# Patient Record
Sex: Female | Born: 1945
Health system: Southern US, Community
[De-identification: ages and names within clinical notes are randomized; demographics above are authoritative.]

## PROBLEM LIST (undated history)

## (undated) DIAGNOSIS — Z86010 Personal history of colonic polyps: Secondary | ICD-10-CM

## (undated) DIAGNOSIS — E785 Hyperlipidemia, unspecified: Secondary | ICD-10-CM

## (undated) DIAGNOSIS — Z860101 Personal history of adenomatous and serrated colon polyps: Secondary | ICD-10-CM

## (undated) DIAGNOSIS — E1165 Type 2 diabetes mellitus with hyperglycemia: Secondary | ICD-10-CM

## (undated) DIAGNOSIS — I10 Essential (primary) hypertension: Secondary | ICD-10-CM

## (undated) DIAGNOSIS — I639 Cerebral infarction, unspecified: Secondary | ICD-10-CM

## (undated) DIAGNOSIS — L0292 Furuncle, unspecified: Secondary | ICD-10-CM

## (undated) DIAGNOSIS — J189 Pneumonia, unspecified organism: Secondary | ICD-10-CM

## (undated) DIAGNOSIS — R011 Cardiac murmur, unspecified: Secondary | ICD-10-CM

## (undated) DIAGNOSIS — IMO0001 Reserved for inherently not codable concepts without codable children: Secondary | ICD-10-CM

## (undated) HISTORY — PX: ABDOMINAL HYSTERECTOMY: SHX81

## (undated) HISTORY — DX: Furuncle, unspecified: L02.92

## (undated) HISTORY — DX: Personal history of adenomatous and serrated colon polyps: Z86.0101

## (undated) HISTORY — DX: Reserved for inherently not codable concepts without codable children: IMO0001

## (undated) HISTORY — DX: Cerebral infarction, unspecified: I63.9

## (undated) HISTORY — DX: Cardiac murmur, unspecified: R01.1

## (undated) HISTORY — DX: Hyperlipidemia, unspecified: E78.5

## (undated) HISTORY — DX: Personal history of colonic polyps: Z86.010

## (undated) HISTORY — DX: Essential (primary) hypertension: I10

## (undated) HISTORY — PX: CARPAL TUNNEL RELEASE: SHX101

## (undated) HISTORY — PX: POLYPECTOMY: SHX149

## (undated) HISTORY — PX: KNEE ARTHROSCOPY: SUR90

## (undated) HISTORY — PX: TONSILLECTOMY: SUR1361

## (undated) HISTORY — DX: Type 2 diabetes mellitus with hyperglycemia: E11.65

---

## 2005-09-29 ENCOUNTER — Ambulatory Visit: Payer: Self-pay | Admitting: Gastroenterology

## 2005-10-21 ENCOUNTER — Other Ambulatory Visit: Admission: RE | Admit: 2005-10-21 | Discharge: 2005-10-21 | Payer: Self-pay | Admitting: Obstetrics and Gynecology

## 2005-11-19 ENCOUNTER — Ambulatory Visit: Payer: Self-pay | Admitting: Gastroenterology

## 2005-11-19 ENCOUNTER — Encounter (INDEPENDENT_AMBULATORY_CARE_PROVIDER_SITE_OTHER): Payer: Self-pay | Admitting: *Deleted

## 2007-03-04 ENCOUNTER — Encounter: Admission: RE | Admit: 2007-03-04 | Discharge: 2007-03-04 | Payer: Self-pay | Admitting: Internal Medicine

## 2010-05-29 NOTE — Assessment & Plan Note (Signed)
[facility]                           GASTROENTEROLOGY OFFICE NOTE   IVELIS, NORGARD                   MRN:          [id number]  DATE:09/29/2005                            DOB:          04-24-1945    CONSULTING PHYSICIAN:  Barbette Hair. Arlyce Dice, M.D., [facility]   REASON FOR CONSULTATION:  For colonoscopy screening and history of polyps.   HISTORY:  Sarah [name] is a pleasant 65 year old African-American female.  She  is an insulin-dependent diabetic times 15 years.  She also has a history of  hypertension.  She relates history of a sigmoidoscopy done approximately 10  years ago and then a colonoscopy done approximately seven years ago while  she was living in [place].  She states she did have polyps removed at  that time and has not had follow up since.  She had a recent physical with  Dr. Clelia Croft.  Her labs showed a hemoglobin of 11.5, hematocrit of 33.6 and WBC  of 6.2.  She completed stool cards, but we do not have the results of those.   The patient currently has no GI complaints.  She states that she has not  been having any difficulty with abdominal pain, no heartburn, indigestion,  etc.  Her bowel habits have been very regular with no constipation or  diarrhea; and, she has not noted any melena or hematochezia.   CURRENT MEDICATIONS:  1. Lescol XL 80 mg daily.  2. Glimepiride 4 mg daily.  3. Aspirin 81 mg daily.  4. Avandamet 4/1,000 twice a day.  5. Lantus 16 units at bedtime.  6. Xalatan eye drops.  7. Multivitamin daily.  8. Calcium daily.   ALLERGIES:  No known drug allergies.   PAST MEDICAL HISTORY:  The past medical history is pertinent for:  1. Hypertension.  2. Insulin-dependent diabetes mellitus.  3. The patient is status post hysterectomy.   FAMILY HISTORY:  The family history is negative for colon cancer or polyps.  Positive for prostatic cancer in her father and diabetes in her mother,  father and brother.   SOCIAL  HISTORY:  The patient is single.  She lives with her mother.  She is  retired.  She is a nonsmoker.  Drinks minimal alcohol socially.   REVIEW OF SYSTEMS:  The review of systems is completely negative including  cardiovascular, pulmonary, genitourinary and musculoskeletal.  Gastrointestinal is as above.   PHYSICAL EXAMINATION:  GENERAL APPEARANCE:  This is a well-developed white  female in no acute distress.  VITAL SIGNS:  Height is 5 feet.  Weight is 183.6.  Blood pressure 122/70 and  pulse is 80.  HEENT:  Atraumatic and normocephalic.  Extraocular muscles are intact.  Pupils equal and react to light and accommodation.  Sclerae anicteric.  HEART:  Cardiovascular exam shows regular rate and rhythm with S1 and S2.  CHEST AND LUNGS:  Pulmonary is clear to auscultation and percussion.  ABDOMEN:  The abdomen is soft and nontender.  There is no palpable mass or  hepatosplenomegaly.  Bowel sounds are active.  RECTAL:  The rectal exam is not done at this time.  IMPRESSION:  1. 70 African-American female with a personal history of colon      polyps who is referred for follow up colonoscopy; currently      asymptomatic.  2. Insulin-dependent diabetes mellitus.  3. Hypertension.  4. Status post hysterectomy.   PLAN:  We will schedule a colonoscopy at her convenience.  We will plan to  hold her aspirin preprocedure and hold her Lantus the evening before.                                   Amy Esterwood, PA-C                                Robert D. Arlyce Dice, MD,FACG   AE/MedQ  DD:  09/29/2005  DT:  10/01/2005  Job #:  [id number]

## 2010-12-02 ENCOUNTER — Ambulatory Visit (AMBULATORY_SURGERY_CENTER): Payer: BC Managed Care – PPO | Admitting: *Deleted

## 2010-12-02 DIAGNOSIS — Z1211 Encounter for screening for malignant neoplasm of colon: Secondary | ICD-10-CM

## 2010-12-02 DIAGNOSIS — Z86010 Personal history of colonic polyps: Secondary | ICD-10-CM

## 2010-12-02 MED ORDER — MOVIPREP 100 G PO SOLR: 1.0000 | Freq: Once | ORAL | Status: DC

## 2010-12-10 ENCOUNTER — Ambulatory Visit (AMBULATORY_SURGERY_CENTER): Payer: BC Managed Care – PPO | Admitting: Gastroenterology

## 2010-12-10 ENCOUNTER — Encounter: Payer: Self-pay | Admitting: Gastroenterology

## 2010-12-10 DIAGNOSIS — Z86010 Personal history of colonic polyps: Secondary | ICD-10-CM

## 2010-12-10 DIAGNOSIS — Z1211 Encounter for screening for malignant neoplasm of colon: Secondary | ICD-10-CM

## 2010-12-10 HISTORY — PX: COLONOSCOPY: SHX174

## 2010-12-10 MED ORDER — SODIUM CHLORIDE 0.9 % IV SOLN: 500.0000 mL | INTRAVENOUS | Status: DC

## 2010-12-10 NOTE — Progress Notes (Signed)
No problems noted in the recovery room. Maw  Patient did not experience any of the following events: a burn prior to discharge; a fall within the facility; wrong site/side/patient/procedure/implant event; or a hospital transfer or hospital admission upon discharge from the facility. [phone]) Patient did not have preoperative order for IV antibiotic SSI prophylaxis. [phone])   I advised the pt to take her medications as she normally does.  To make sure she eats before taking metformin and going back to sleep.  Pt states she understands. maw

## 2010-12-10 NOTE — Patient Instructions (Signed)
See the picture page for your findings from your exam today.  Follow the green and blue discharge instruction sheets the rest of the day.  Resume your prior medications today. Please call if any questions or concerns.  

## 2010-12-11 ENCOUNTER — Telehealth: Payer: Self-pay | Admitting: *Deleted

## 2010-12-11 ENCOUNTER — Other Ambulatory Visit: Payer: Self-pay | Admitting: Gastroenterology

## 2010-12-11 NOTE — Telephone Encounter (Signed)
Ok to leave message on machine not received in the admitting area, no message left

## 2010-12-11 NOTE — Telephone Encounter (Signed)

## 2011-02-09 ENCOUNTER — Encounter (INDEPENDENT_AMBULATORY_CARE_PROVIDER_SITE_OTHER): Payer: Self-pay | Admitting: Surgery

## 2011-02-09 ENCOUNTER — Ambulatory Visit (INDEPENDENT_AMBULATORY_CARE_PROVIDER_SITE_OTHER): Payer: BC Managed Care – PPO | Admitting: Surgery

## 2011-02-09 VITALS — BP 142/78 | HR 120 | Temp 99.3°F | Resp 18 | Ht 60.0 in | Wt 158.8 lb

## 2011-02-09 DIAGNOSIS — L03317 Cellulitis of buttock: Secondary | ICD-10-CM

## 2011-02-09 DIAGNOSIS — L0231 Cutaneous abscess of buttock: Secondary | ICD-10-CM

## 2011-02-09 MED ORDER — HYDROCODONE-ACETAMINOPHEN 5-500 MG PO TABS: 1.0000 | ORAL_TABLET | ORAL | Status: AC | PRN

## 2011-02-09 NOTE — Progress Notes (Signed)
Patient ID: Sarah [name], female   DOB: 1945-06-12, 66 y.o.   MRN: [id number]  Chief Complaint  Patient presents with  . Abscess    on buttock    HPI Sarah [name] is a 66 y.o. female.  Referred to Urgent Office by Dr. Eric Form for right buttock abscess HPI 66 yo female with poorly controlled DM2 presents with a three day history of an enlarging "boil" on her right buttock.  She had a similar episode about 20 years ago.  She has tried to treat this with warm compresses, but it continues to worsen.  She was seen by Dr. Clelia Croft yesterday and was started on Doxycycline.   Past Medical History  Diagnosis Date  . Blood transfusion   . Heart murmur   . Hyperlipidemia   . Diabetes mellitus     takes Diovan for her kidneys  . Boils   . Abscess   . HTN (hypertension)   . Hx of adenomatous colonic polyps   . Glaucoma   . Osteoporosis     Past Surgical History  Procedure Date  . Colonoscopy   . Polypectomy   . Abdominal hysterectomy   . Tonsillectomy   . Carpal tunnel release     right  . Knee arthroscopy     right    Family History  Problem Relation Age of Onset  . Colon cancer Neg Hx   . Esophageal cancer Neg Hx   . Rectal cancer Neg Hx   . Stomach cancer Neg Hx   . Diabetes Mother   . Hypertension Mother     Social History History  Substance Use Topics  . Smoking status: Current Some Day Smoker  . Smokeless tobacco: Never Used   Comment: smokes occ.   Marland Kitchen Alcohol Use: Yes    No Known Allergies  Current Outpatient Prescriptions  Medication Sig Dispense Refill  . aspirin 81 MG tablet Take 81 mg by mouth daily.        Marland Kitchen doxycycline (ADOXA) 100 MG tablet Take 100 mg by mouth 2 (two) times daily.      . DULoxetine (CYMBALTA) 60 MG capsule Take 60 mg by mouth daily.      . fluconazole (DIFLUCAN) 150 MG tablet Take 150 mg by mouth once.      . fluticasone (FLONASE) 50 MCG/ACT nasal spray Place 2 sprays into the nose daily.      . insulin regular (HUMULIN  R,NOVOLIN R) 100 units/mL injection Inject 20 Units into the skin 3 (three) times daily before meals.        Marland Kitchen loratadine (CLARITIN) 10 MG tablet Take 10 mg by mouth daily.      . metFORMIN (GLUCOPHAGE) 1000 MG tablet 1 tablet 2 (two) times daily.      . simvastatin (ZOCOR) 40 MG tablet 1 tablet Daily.      . sodium chloride (OCEAN) 0.65 % nasal spray Place 1 spray into the nose as needed.      . valsartan-hydrochlorothiazide (DIOVAN-HCT) 320-25 MG per tablet Take 1 tablet by mouth daily.        . Vitamin D, Ergocalciferol, (DRISDOL) 50000 UNITS CAPS 1 capsule 2 (two) times a week.      Marland Kitchen HYDROcodone-acetaminophen (VICODIN) 5-500 MG per tablet Take 1 tablet by mouth every 4 (four) hours as needed for pain.  40 tablet  0    Review of Systems Review of Systems  Blood pressure 142/78, pulse 120, temperature 99.3 F (37.4 C), temperature source Temporal,  resp. rate 18, height 5' (1.524 m), weight 158 lb 12.8 oz (72.031 kg).  Physical Exam Physical Exam WDWN - appears uncomfortable Right buttock - medial, but not at the anus - 4 cm area of induration/ erythema with central fluctuant area. Data Reviewed None  Assessment    Right buttock abscess    Plan    This area was prepped with Betadine and anesthetized with 1% Lidocaine.  I made a round 1 cm hole in the central fluctuant area and drained about 10 ml of purulent fluid.  I probed the abscess cavity with a hemostat.  The abscess cavity seems to be only about 1.5 cm in diameter with some surrounding induration/ cellulitis.  The wound was packed with 1/2 inch nuGauze and covered with dry dressing. She will remove the packing in 1-2 days and begin treating with sitz baths and neosporin ointment.  Follow-up 1 week.       [name] K. 02/09/2011, 4:21 PM

## 2011-02-09 NOTE — Patient Instructions (Signed)
Remove the packing in 24-36 hours, then begin soaking 2-3 times each day in warm soapy water.  Dry the area and then apply some antibiotic ointment with a cotton swab, then cover with some dry gauze.

## 2011-02-11 ENCOUNTER — Encounter (INDEPENDENT_AMBULATORY_CARE_PROVIDER_SITE_OTHER): Payer: Self-pay

## 2011-02-18 ENCOUNTER — Encounter (INDEPENDENT_AMBULATORY_CARE_PROVIDER_SITE_OTHER): Payer: BC Managed Care – PPO | Admitting: Surgery

## 2011-03-02 ENCOUNTER — Ambulatory Visit (INDEPENDENT_AMBULATORY_CARE_PROVIDER_SITE_OTHER): Payer: BC Managed Care – PPO | Admitting: Surgery

## 2011-03-02 ENCOUNTER — Encounter (INDEPENDENT_AMBULATORY_CARE_PROVIDER_SITE_OTHER): Payer: Self-pay | Admitting: Surgery

## 2011-03-02 VITALS — BP 166/90 | HR 106 | Temp 98.4°F | Resp 12 | Ht 60.0 in | Wt 166.6 lb

## 2011-03-02 DIAGNOSIS — L0231 Cutaneous abscess of buttock: Secondary | ICD-10-CM

## 2011-03-02 DIAGNOSIS — L03317 Cellulitis of buttock: Secondary | ICD-10-CM

## 2011-03-02 NOTE — Progress Notes (Signed)
Status post drainage of a right buttock abscess on 02/09/11. The packing came out within 2 days. The wound has now healed completely. There is no residual swelling, firmness, or tenderness. There is only a small scar where we performed our incision.  No further treatment needed. Followup p.r.n.  Wilmon Arms. Corliss Skains, MD, [facility]  03/02/2011 4:02 PM

## 2011-04-17 ENCOUNTER — Emergency Department (INDEPENDENT_AMBULATORY_CARE_PROVIDER_SITE_OTHER)
Admission: EM | Admit: 2011-04-17 | Discharge: 2011-04-17 | Disposition: A | Discharge status: Home / Self Care | Payer: BC Managed Care – PPO | Source: Home / Self Care | Attending: Family Medicine | Admitting: Family Medicine

## 2011-04-17 ENCOUNTER — Other Ambulatory Visit: Payer: Self-pay

## 2011-04-17 ENCOUNTER — Encounter (HOSPITAL_COMMUNITY): Payer: Self-pay | Admitting: *Deleted

## 2011-04-17 DIAGNOSIS — J069 Acute upper respiratory infection, unspecified: Secondary | ICD-10-CM

## 2011-04-17 DIAGNOSIS — R55 Syncope and collapse: Secondary | ICD-10-CM

## 2011-04-17 LAB — POCT I-STAT, CHEM 8
BUN: 14 mg/dL (ref 6–23)
Calcium, Ion: 1.21 mmol/L (ref 1.12–1.32)
Chloride: 106 mEq/L (ref 96–112)
Creatinine, Ser: 0.8 mg/dL (ref 0.50–1.10)
Glucose, Bld: 166 mg/dL — ABNORMAL HIGH (ref 70–99)
HCT: 44 % (ref 36.0–46.0)
Hemoglobin: 15 g/dL (ref 12.0–15.0)
Potassium: 3.3 mEq/L — ABNORMAL LOW (ref 3.5–5.1)
Sodium: 142 mEq/L (ref 135–145)
TCO2: 24 mmol/L (ref 0–100)

## 2011-04-17 MED ORDER — FLUTICASONE PROPIONATE 50 MCG/ACT NA SUSP: 2.0000 | Freq: Every day | NASAL | Status: DC | PRN

## 2011-04-17 MED ORDER — GUAIFENESIN-CODEINE 100-10 MG/5ML PO SYRP: 5.0000 mL | ORAL_SOLUTION | Freq: Four times a day (QID) | ORAL | Status: AC | PRN

## 2011-04-17 MED ORDER — ONDANSETRON HCL 4 MG PO TABS: 4.0000 mg | ORAL_TABLET | Freq: Three times a day (TID) | ORAL | Status: AC | PRN

## 2011-04-17 NOTE — ED Notes (Signed)
Pt with onset of cough/congestion/aching 2 to 3 weeks - today onset of nausea vomiting and diarrhea dizziness   - vomited  and diarrhea  one this morning - staff called to waiting area pt had gone to bathroom  - syncopal episode when coming out of bathroom - per niece did not respond to her immediately when staff approached pt lying on stomach responding verbally -  Lower lip laceration inner right side lip - no bleeding at this time - pt assisted to wheelchair taken to exam room

## 2011-04-17 NOTE — ED Notes (Signed)
Pt instructions reviewed   Repeat vital signs completed - pt taken to vehicle via wheelchair by RN - family and pt advised of importance of drinking clear liquids

## 2011-04-17 NOTE — ED Provider Notes (Signed)
History     CSN: [id number]  Arrival date & time 04/17/11  1310   First MD Initiated Contact with Patient 04/17/11 1327      Chief Complaint  Patient presents with  . Cough  . Generalized Body Aches  . Nasal Congestion  . Emesis  . Nausea  . Diarrhea  . Fall  . Loss of Consciousness    (Consider location/radiation/quality/duration/timing/severity/associated sxs/prior treatment) HPI Comments: Sarah [name] presents for evaluation today of URI sx, non-productive cough, nasal congestion, body aches. However, this provider was called out to the waiting area after Sarah [name] apparently fell, while going to the bathroom. She was walking towards the bathroom, when she experienced a near-syncopal episode, falling face forward. She remembers the event and does not think she struck her head, nor did she lose consciousness. She was alert and oriented x 3 and speaking coherently.   Patient is a 66 y.o. female presenting with cough, vomiting, diarrhea, and fall. The history is provided by the patient.  Cough This is a new problem. The current episode started more than 1 week ago. The problem occurs constantly. The cough is non-productive. Associated symptoms include rhinorrhea and myalgias. Pertinent negatives include no sore throat.  Emesis  This is a new problem. The current episode started 6 to 12 hours ago. The problem occurs 2 to 4 times per day. The problem has not changed since onset.There has been no fever. Associated symptoms include cough, diarrhea and myalgias.  Diarrhea The primary symptoms include nausea, vomiting, diarrhea and myalgias. The illness began today. The problem has not changed since onset. Fall The accident occurred less than 1 hour ago. The fall occurred while walking. She landed on a hard floor. She was ambulatory at the scene. Associated symptoms include nausea and vomiting. Pertinent negatives include no visual change, no numbness, no bowel incontinence and no loss of  consciousness.    Past Medical History  Diagnosis Date  . Blood transfusion   . Heart murmur   . Hyperlipidemia   . Diabetes mellitus     takes Diovan for her kidneys  . Boils   . Abscess   . HTN (hypertension)   . Hx of adenomatous colonic polyps   . Glaucoma   . Osteoporosis     Past Surgical History  Procedure Date  . Colonoscopy   . Polypectomy   . Abdominal hysterectomy   . Tonsillectomy   . Carpal tunnel release     right  . Knee arthroscopy     right    Family History  Problem Relation Age of Onset  . Colon cancer Neg Hx   . Esophageal cancer Neg Hx   . Rectal cancer Neg Hx   . Stomach cancer Neg Hx   . Diabetes Mother   . Hypertension Mother     History  Substance Use Topics  . Smoking status: Current Some Day Smoker  . Smokeless tobacco: Never Used   Comment: smokes occ.   Marland Kitchen Alcohol Use: Yes    OB History    Grav Para Term Preterm Abortions TAB SAB Ect Mult Living                  Review of Systems  Constitutional: Negative.   HENT: Positive for congestion and rhinorrhea. Negative for sore throat.   Eyes: Negative.   Respiratory: Positive for cough.   Cardiovascular: Negative.   Gastrointestinal: Positive for nausea, vomiting and diarrhea. Negative for bowel incontinence.  Genitourinary: Negative.   Musculoskeletal:  Positive for myalgias.  Skin: Negative.   Neurological: Positive for syncope. Negative for loss of consciousness and numbness.    Allergies  Review of patient's allergies indicates no known allergies.  Home Medications   Current Outpatient Rx  Name Route Sig Dispense Refill  . ASPIRIN 81 MG PO TABS Oral Take 81 mg by mouth daily.      Marland Kitchen FLUCONAZOLE 150 MG PO TABS Oral Take 150 mg by mouth once.    . INSULIN REGULAR HUMAN 100 UNIT/ML IJ SOLN Subcutaneous Inject 20 Units into the skin 3 (three) times daily before meals.      Marland Kitchen METFORMIN HCL 1000 MG PO TABS  1 tablet 2 (two) times daily.    . NYQUIL PO Oral Take by mouth.     Marland Kitchen SIMVASTATIN 40 MG PO TABS  1 tablet Daily.    Marland Kitchen SALINE NASAL SPRAY 0.65 % NA SOLN Nasal Place 1 spray into the nose as needed.    Marland Kitchen VALSARTAN-HYDROCHLOROTHIAZIDE 320-25 MG PO TABS Oral Take 1 tablet by mouth daily.      Marland Kitchen VITAMIN D (ERGOCALCIFEROL) 50000 UNITS PO CAPS  1 capsule 2 (two) times a week.    Marland Kitchen DOXYCYCLINE MONOHYDRATE 100 MG PO TABS Oral Take 100 mg by mouth 2 (two) times daily.    . DULOXETINE HCL 60 MG PO CPEP Oral Take 60 mg by mouth daily.    Marland Kitchen FLUTICASONE PROPIONATE 50 MCG/ACT NA SUSP Nasal Place 2 sprays into the nose daily as needed for rhinitis. 16 g 1  . LORATADINE 10 MG PO TABS Oral Take 10 mg by mouth daily.      BP 111/66  Pulse 97  Temp(Src) 99.6 F (37.6 C) (Oral)  Resp 18  SpO2 98%  Physical Exam  Nursing note and vitals reviewed. Constitutional: She is oriented to person, place, and time. She appears well-developed and well-nourished.  HENT:  Head: Normocephalic and atraumatic.  Right Ear: Tympanic membrane is retracted.  Left Ear: Tympanic membrane is retracted.  Mouth/Throat: Uvula is midline, oropharynx is clear and moist and mucous membranes are normal.  Eyes: Conjunctivae, EOM and lids are normal. Pupils are equal, round, and reactive to light.  Neck: Normal range of motion.  Cardiovascular: Normal rate, regular rhythm, S1 normal and S2 normal.   Murmur heard.  Systolic murmur is present with a grade of 3/6       ECG: NSR, rate 96 bpm, no ST elevations; nonspecific T wave changes, no interval abnormalities  Pulmonary/Chest: Effort normal and breath sounds normal. She has no decreased breath sounds. She has no wheezes. She has no rhonchi.  Abdominal: Soft. Normal appearance and bowel sounds are normal. There is no tenderness. There is no rebound and no guarding.  Musculoskeletal: Normal range of motion.  Neurological: She is alert and oriented to person, place, and time. She has normal strength. No cranial nerve deficit or sensory deficit. She  displays a negative Romberg sign. Coordination and gait normal. GCS eye subscore is 4. GCS verbal subscore is 5. GCS motor subscore is 6.  Skin: Skin is warm and dry.  Psychiatric: Her behavior is normal.    ED Course  Procedures (including critical care time)  Labs Reviewed  POCT I-STAT, CHEM 8 - Abnormal; Notable for the following:    Potassium 3.3 (*)    Glucose, Bld 166 (*)    All other components within normal limits  LAB REPORT - SCANNED   No results found.   1. URI (  upper respiratory infection)   2. Vasovagal syncope       MDM  ECG unremarkable; labs reviewed; exam unremarkable; normal neurological exam; given rx for fluticasone        Renaee Munda, MD 05/13/11 1217

## 2011-04-17 NOTE — Discharge Instructions (Signed)
Use nasal spray as directed. Use cough syrup at night as directed, and as needed. Increase fluid intake. Take medications (ondansetron for nausea) as instructed. Consume clear liquids such as water, Sprite, gingerale, light juices for the next 12 to 24 hours, then advance as tolerated to [employer] (bananas, rice, applesauce, toast) and bland things such as soup, crackers, etc. Stop taking medications and return if any blood in stool or any fevers, or you are unable to eat or drink. Return to care should your symptoms not improve, or worsen in any way.

## 2011-09-22 ENCOUNTER — Emergency Department (INDEPENDENT_AMBULATORY_CARE_PROVIDER_SITE_OTHER)
Admission: EM | Admit: 2011-09-22 | Discharge: 2011-09-22 | Disposition: A | Discharge status: Home / Self Care | Payer: BC Managed Care – PPO | Source: Home / Self Care | Attending: Emergency Medicine | Admitting: Emergency Medicine

## 2011-09-22 ENCOUNTER — Emergency Department (INDEPENDENT_AMBULATORY_CARE_PROVIDER_SITE_OTHER): Payer: BC Managed Care – PPO

## 2011-09-22 ENCOUNTER — Encounter (HOSPITAL_COMMUNITY): Payer: Self-pay | Admitting: Emergency Medicine

## 2011-09-22 DIAGNOSIS — R110 Nausea: Secondary | ICD-10-CM

## 2011-09-22 DIAGNOSIS — N10 Acute tubulo-interstitial nephritis: Secondary | ICD-10-CM

## 2011-09-22 DIAGNOSIS — R42 Dizziness and giddiness: Secondary | ICD-10-CM

## 2011-09-22 LAB — POCT URINALYSIS DIP (DEVICE)
Bilirubin Urine: NEGATIVE
Glucose, UA: 500 mg/dL — AB
Ketones, ur: 40 mg/dL — AB
Leukocytes, UA: NEGATIVE
Nitrite: POSITIVE — AB
Protein, ur: 100 mg/dL — AB
Specific Gravity, Urine: 1.015 (ref 1.005–1.030)
Urobilinogen, UA: 0.2 mg/dL (ref 0.0–1.0)
pH: 7.5 (ref 5.0–8.0)

## 2011-09-22 LAB — POCT I-STAT, CHEM 8
BUN: 7 mg/dL (ref 6–23)
Calcium, Ion: 1.17 mmol/L (ref 1.13–1.30)
Chloride: 101 mEq/L (ref 96–112)
Creatinine, Ser: 0.8 mg/dL (ref 0.50–1.10)
Glucose, Bld: 297 mg/dL — ABNORMAL HIGH (ref 70–99)
HCT: 42 % (ref 36.0–46.0)
Hemoglobin: 14.3 g/dL (ref 12.0–15.0)
Potassium: 4.5 mEq/L (ref 3.5–5.1)
Sodium: 136 mEq/L (ref 135–145)
TCO2: 22 mmol/L (ref 0–100)

## 2011-09-22 MED ORDER — SODIUM CHLORIDE 0.9 % IV SOLN: Freq: Once | INTRAVENOUS | Status: DC

## 2011-09-22 MED ORDER — CEFTRIAXONE SODIUM 1 G IJ SOLR
INTRAMUSCULAR | Status: AC
  Filled 2011-09-22: qty 10

## 2011-09-22 MED ORDER — ONDANSETRON HCL 4 MG/2ML IJ SOLN
4.0000 mg | Freq: Once | INTRAMUSCULAR | Status: AC
  Administered 2011-09-22: 4 mg via INTRAVENOUS

## 2011-09-22 MED ORDER — ONDANSETRON HCL 4 MG/2ML IJ SOLN
INTRAMUSCULAR | Status: AC
  Filled 2011-09-22: qty 2

## 2011-09-22 MED ORDER — SODIUM CHLORIDE 0.9 % IJ SOLN
INTRAMUSCULAR | Status: AC
  Filled 2011-09-22: qty 3

## 2011-09-22 MED ORDER — ACETAMINOPHEN 325 MG PO TABS
975.0000 mg | ORAL_TABLET | Freq: Once | ORAL | Status: AC
  Administered 2011-09-22: 975 mg via ORAL

## 2011-09-22 MED ORDER — LEVOFLOXACIN 250 MG PO TABS: 250.0000 mg | ORAL_TABLET | Freq: Every day | ORAL | Status: DC

## 2011-09-22 MED ORDER — CEFTRIAXONE SODIUM 1 G IJ SOLR
1.0000 g | Freq: Once | INTRAMUSCULAR | Status: AC
  Administered 2011-09-22: 1 g via INTRAMUSCULAR

## 2011-09-22 MED ORDER — ACETAMINOPHEN 325 MG PO TABS
ORAL_TABLET | ORAL | Status: AC
  Filled 2011-09-22: qty 3

## 2011-09-22 MED ORDER — ONDANSETRON HCL 4 MG PO TABS: 4.0000 mg | ORAL_TABLET | Freq: Four times a day (QID) | ORAL | Status: DC

## 2011-09-22 MED ORDER — SODIUM CHLORIDE 0.9 % IV SOLN
Freq: Once | INTRAVENOUS | Status: AC
  Administered 2011-09-22: 17:00:00 via INTRAVENOUS

## 2011-09-22 NOTE — ED Provider Notes (Signed)
History     CSN: [id number]  Arrival date & time 09/22/11  1502   None     Chief Complaint  Patient presents with  . Fever  . Abdominal Pain    (Consider location/radiation/quality/duration/timing/severity/associated sxs/prior treatment) The history is provided by the patient.   Patient reports cold symptoms over the past week with nonproductive cough and fever.  Symptoms progressed to poor appetite and intake, and general malaise.  States in the past two days she has noticed increased dizziness, onset of nausea and lower abdominal pain.   Past Medical History  Diagnosis Date  . Blood transfusion   . Heart murmur   . Hyperlipidemia   . Diabetes mellitus     takes Diovan for her kidneys  . Boils   . Abscess   . HTN (hypertension)   . Hx of adenomatous colonic polyps   . Glaucoma   . Osteoporosis     Past Surgical History  Procedure Date  . Colonoscopy   . Polypectomy   . Abdominal hysterectomy   . Tonsillectomy   . Carpal tunnel release     right  . Knee arthroscopy     right    Family History  Problem Relation Age of Onset  . Colon cancer Neg Hx   . Esophageal cancer Neg Hx   . Rectal cancer Neg Hx   . Stomach cancer Neg Hx   . Diabetes Mother   . Hypertension Mother     History  Substance Use Topics  . Smoking status: Former Games developer  . Smokeless tobacco: Never Used   Comment: smokes occ.   Marland Kitchen Alcohol Use: No    OB History    Grav Para Term Preterm Abortions TAB SAB Ect Mult Living                  Review of Systems  Constitutional: Positive for fever, chills, activity change, appetite change and fatigue.  HENT: Positive for congestion.   Respiratory: Positive for cough. Negative for chest tightness, shortness of breath and wheezing.   Cardiovascular: Negative.   Gastrointestinal: Positive for nausea, abdominal pain and diarrhea. Negative for vomiting, constipation and blood in stool.  Genitourinary: Negative.   Musculoskeletal: Positive for  myalgias.  Skin: Negative for rash.  Neurological: Positive for dizziness and headaches. Negative for syncope and weakness.  Hematological: Negative.     Allergies  Review of patient's allergies indicates no known allergies.  Home Medications   Current Outpatient Rx  Name Route Sig Dispense Refill  . ASPIRIN 81 MG PO TABS Oral Take 81 mg by mouth daily.      Marland Kitchen LORATADINE 10 MG PO TABS Oral Take 10 mg by mouth daily.    Marland Kitchen METFORMIN HCL 1000 MG PO TABS  1 tablet 2 (two) times daily.    . NYQUIL PO Oral Take by mouth.    Marland Kitchen SIMVASTATIN 40 MG PO TABS  1 tablet Daily.    Marland Kitchen DOXYCYCLINE MONOHYDRATE 100 MG PO TABS Oral Take 100 mg by mouth 2 (two) times daily.    . DULOXETINE HCL 60 MG PO CPEP Oral Take 60 mg by mouth daily.    Marland Kitchen FLUCONAZOLE 150 MG PO TABS Oral Take 150 mg by mouth once.    Marland Kitchen FLUTICASONE PROPIONATE 50 MCG/ACT NA SUSP Nasal Place 2 sprays into the nose daily as needed for rhinitis. 16 g 1  . INSULIN REGULAR HUMAN 100 UNIT/ML IJ SOLN Subcutaneous Inject 20 Units into the skin 3 (  three) times daily before meals.      Marland Kitchen LEVOFLOXACIN 250 MG PO TABS Oral Take 1 tablet (250 mg total) by mouth daily. 5 tablet 0  . ONDANSETRON HCL 4 MG PO TABS Oral Take 1 tablet (4 mg total) by mouth every 6 (six) hours. 12 tablet 0  . SALINE NASAL SPRAY 0.65 % NA SOLN Nasal Place 1 spray into the nose as needed.    Marland Kitchen VALSARTAN-HYDROCHLOROTHIAZIDE 320-25 MG PO TABS Oral Take 1 tablet by mouth daily.      Marland Kitchen VITAMIN D (ERGOCALCIFEROL) 50000 UNITS PO CAPS  1 capsule 2 (two) times a week.      BP 175/75  Pulse 114  Temp 100.7 F (38.2 C) (Oral)  Resp 20  SpO2 96%  Physical Exam  Nursing note and vitals reviewed. Constitutional: She is oriented to person, place, and time. Vital signs are normal. She appears well-developed and well-nourished. She is active and cooperative.  HENT:  Head: Normocephalic.  Right Ear: Hearing, tympanic membrane, external ear and ear canal normal.  Left Ear: Hearing,  tympanic membrane, external ear and ear canal normal.  Nose: Nose normal. Right sinus exhibits no maxillary sinus tenderness and no frontal sinus tenderness. Left sinus exhibits no maxillary sinus tenderness and no frontal sinus tenderness.  Eyes: Conjunctivae normal are normal. Pupils are equal, round, and reactive to light. No scleral icterus.  Neck: Trachea normal. Neck supple. No spinous process tenderness and no muscular tenderness present.  Cardiovascular: Regular rhythm and normal heart sounds.  Tachycardia present.   Pulmonary/Chest: Effort normal. She has rhonchi in the right lower field.  Abdominal: Soft. Normal appearance and bowel sounds are normal. She exhibits no distension. There is tenderness in the suprapubic area. There is no rebound and no CVA tenderness.  Neurological: She is alert and oriented to person, place, and time. No cranial nerve deficit or sensory deficit. Coordination normal.  Skin: Skin is warm and dry.  Psychiatric: She has a normal mood and affect. Her speech is normal and behavior is normal. Judgment and thought content normal. Cognition and memory are normal.    ED Course  Procedures (including critical care time)  Labs Reviewed  POCT URINALYSIS DIP (DEVICE) - Abnormal; Notable for the following:    Glucose, UA 500 (*)     Ketones, ur 40 (*)     Hgb urine dipstick TRACE (*)     Protein, ur 100 (*)     Nitrite POSITIVE (*)     All other components within normal limits  POCT I-STAT, CHEM 8 - Abnormal; Notable for the following:    Glucose, Bld 297 (*)     All other components within normal limits  URINE CULTURE   Dg Chest 2 View  09/22/2011  *RADIOLOGY REPORT*  Clinical Data: Cough.  Fever.  Headache.  Dizziness.  CHEST - 2 VIEW  Comparison: None.  Findings: Cardiomediastinal silhouette unremarkable.  Linear atelectasis or scarring in the lingula.  Lungs otherwise clear. Bronchovascular markings normal.  Pulmonary vascularity normal.  No pneumothorax.   No pleural effusions.  Minimal degenerative changes involving the thoracic spine.  IMPRESSION: Linear atelectasis or scar in the lingula.  No acute cardiopulmonary disease otherwise.   Original Report Authenticated By: Arnell Sieving, M.D.      1. Pyelonephritis, acute   2. Nausea   3. Dizziness       MDM  Acetaminophen, zofran 4mg  and ceftriaxone in addition to 2 L bolus administered in office.  Pt  reports improved symptoms.  To return to UC in 2 days for recheck of symptoms and ensure improved condition.       Johnsie Kindred, NP 09/22/11 1900

## 2011-09-22 NOTE — ED Notes (Signed)
Pt started 2 days ago with general malaise. Started with abd pain over past 2 days. Denies N/V/D or constipation. Temp 103.3. Pt is IDDM.

## 2011-09-23 ENCOUNTER — Inpatient Hospital Stay (HOSPITAL_COMMUNITY)
Admission: EM | Admit: 2011-09-23 | Discharge: 2011-09-27 | DRG: 541 | Disposition: A | Discharge status: Home / Self Care | Payer: BC Managed Care – PPO | Attending: Internal Medicine | Admitting: Internal Medicine

## 2011-09-23 ENCOUNTER — Encounter (HOSPITAL_COMMUNITY): Payer: Self-pay | Admitting: *Deleted

## 2011-09-23 DIAGNOSIS — E1121 Type 2 diabetes mellitus with diabetic nephropathy: Secondary | ICD-10-CM

## 2011-09-23 DIAGNOSIS — F17200 Nicotine dependence, unspecified, uncomplicated: Secondary | ICD-10-CM | POA: Diagnosis present

## 2011-09-23 DIAGNOSIS — Z9089 Acquired absence of other organs: Secondary | ICD-10-CM

## 2011-09-23 DIAGNOSIS — L0231 Cutaneous abscess of buttock: Secondary | ICD-10-CM

## 2011-09-23 DIAGNOSIS — J189 Pneumonia, unspecified organism: Secondary | ICD-10-CM | POA: Diagnosis present

## 2011-09-23 DIAGNOSIS — R000 Tachycardia, unspecified: Secondary | ICD-10-CM

## 2011-09-23 DIAGNOSIS — R509 Fever, unspecified: Secondary | ICD-10-CM

## 2011-09-23 DIAGNOSIS — E782 Mixed hyperlipidemia: Secondary | ICD-10-CM

## 2011-09-23 DIAGNOSIS — D72829 Elevated white blood cell count, unspecified: Secondary | ICD-10-CM

## 2011-09-23 DIAGNOSIS — R6510 Systemic inflammatory response syndrome (SIRS) of non-infectious origin without acute organ dysfunction: Secondary | ICD-10-CM | POA: Diagnosis present

## 2011-09-23 DIAGNOSIS — E119 Type 2 diabetes mellitus without complications: Secondary | ICD-10-CM | POA: Diagnosis present

## 2011-09-23 DIAGNOSIS — I10 Essential (primary) hypertension: Secondary | ICD-10-CM

## 2011-09-23 DIAGNOSIS — D649 Anemia, unspecified: Secondary | ICD-10-CM

## 2011-09-23 DIAGNOSIS — E785 Hyperlipidemia, unspecified: Secondary | ICD-10-CM | POA: Diagnosis present

## 2011-09-23 LAB — CBC WITH DIFFERENTIAL/PLATELET
Basophils Absolute: 0 10*3/uL (ref 0.0–0.1)
Basophils Relative: 0 % (ref 0–1)
Eosinophils Absolute: 0 10*3/uL (ref 0.0–0.7)
Eosinophils Relative: 0 % (ref 0–5)
HCT: 37.1 % (ref 36.0–46.0)
Hemoglobin: 12.7 g/dL (ref 12.0–15.0)
Lymphocytes Relative: 9 % — ABNORMAL LOW (ref 12–46)
Lymphs Abs: 1.9 10*3/uL (ref 0.7–4.0)
MCH: 31.4 pg (ref 26.0–34.0)
MCHC: 34.2 g/dL (ref 30.0–36.0)
MCV: 91.6 fL (ref 78.0–100.0)
Monocytes Absolute: 1 10*3/uL (ref 0.1–1.0)
Monocytes Relative: 5 % (ref 3–12)
Neutro Abs: 17.2 10*3/uL — ABNORMAL HIGH (ref 1.7–7.7)
Neutrophils Relative %: 86 % — ABNORMAL HIGH (ref 43–77)
Platelets: 282 10*3/uL (ref 150–400)
RBC: 4.05 MIL/uL (ref 3.87–5.11)
RDW: 12.8 % (ref 11.5–15.5)
WBC: 20.1 10*3/uL — ABNORMAL HIGH (ref 4.0–10.5)

## 2011-09-23 LAB — URINALYSIS, ROUTINE W REFLEX MICROSCOPIC
Glucose, UA: >1000 mg/dL — AB
Ketones, ur: 40 mg/dL — AB
Leukocytes, UA: NEGATIVE
Nitrite: NEGATIVE
Protein, ur: >300 mg/dL — AB
Specific Gravity, Urine: 1.037 — ABNORMAL HIGH (ref 1.005–1.030)
Urobilinogen, UA: 1 mg/dL (ref 0.0–1.0)
pH: 6 (ref 5.0–8.0)

## 2011-09-23 LAB — COMPREHENSIVE METABOLIC PANEL
ALT: 12 U/L (ref 0–35)
AST: 30 U/L (ref 0–37)
Albumin: 3.2 g/dL — ABNORMAL LOW (ref 3.5–5.2)
Alkaline Phosphatase: 72 U/L (ref 39–117)
BUN: 12 mg/dL (ref 6–23)
CO2: 21 mEq/L (ref 19–32)
Calcium: 9.6 mg/dL (ref 8.4–10.5)
Chloride: 99 mEq/L (ref 96–112)
Creatinine, Ser: 0.69 mg/dL (ref 0.50–1.10)
GFR calc Af Amer: >90 mL/min (ref >90)
GFR calc non Af Amer: >90 mL/min (ref >90)
Glucose, Bld: 112 mg/dL — ABNORMAL HIGH (ref 70–99)
Potassium: 3.8 mEq/L (ref 3.5–5.1)
Sodium: 135 mEq/L (ref 135–145)
Total Bilirubin: 0.3 mg/dL (ref 0.3–1.2)
Total Protein: 7.7 g/dL (ref 6.0–8.3)

## 2011-09-23 LAB — URINE CULTURE
Colony Count: NO GROWTH
Culture: NO GROWTH

## 2011-09-23 LAB — LIPASE, BLOOD: Lipase: 23 U/L (ref 11–59)

## 2011-09-23 LAB — URINE MICROSCOPIC-ADD ON

## 2011-09-23 LAB — LACTIC ACID, PLASMA: Lactic Acid, Venous: 1.7 mmol/L (ref 0.5–2.2)

## 2011-09-23 MED ORDER — SODIUM CHLORIDE 0.9 % IV BOLUS (SEPSIS)
1000.0000 mL | Freq: Once | INTRAVENOUS | Status: AC
  Administered 2011-09-23: 1000 mL via INTRAVENOUS

## 2011-09-23 MED ORDER — ACETAMINOPHEN 325 MG PO TABS
650.0000 mg | ORAL_TABLET | Freq: Once | ORAL | Status: AC
  Administered 2011-09-23: 650 mg via ORAL
  Filled 2011-09-23: qty 2

## 2011-09-23 NOTE — ED Notes (Signed)
abd pain with nausea for 2-3 days she was seen at [facility] for the same yesterday.  She cannot eat

## 2011-09-23 NOTE — ED Notes (Signed)
MD at bedside. 

## 2011-09-23 NOTE — ED Notes (Signed)
The pt last had tylenol this am.  She had blood and urine including a urine culture last pm

## 2011-09-23 NOTE — ED Notes (Signed)
Pt reports weakness, fever, all over body aches, chills, and decrease appetite x4 days. Pt denies cough, congestion, N/V/D, abd pain, or problems urinating. Pt a/o x4, warm to touch

## 2011-09-23 NOTE — ED Provider Notes (Signed)
Medical screening examination/treatment/procedure(s) were performed by non-physician practitioner and as supervising physician I was immediately available for consultation/collaboration.  Cypher Paule   [name], MD 09/23/11 0747 

## 2011-09-23 NOTE — ED Provider Notes (Signed)
History     CSN: [id number]  Arrival date & time 09/23/11  1846   First MD Initiated Contact with Patient 09/23/11 2119      Chief Complaint  Patient presents with  . Abdominal Pain    (Consider location/radiation/quality/duration/timing/severity/associated sxs/prior treatment) Patient is a 66 y.o. female presenting with abdominal pain. The history is provided by the patient.  Abdominal Pain The primary symptoms of the illness include abdominal pain, fever, fatigue and nausea. The primary symptoms of the illness do not include vomiting, diarrhea or dysuria.  Additional symptoms associated with the illness include frequency. Symptoms associated with the illness do not include back pain.   patient has had fever weakness and body aches all over. She's had decreased appetite. She has had some lower abdominal pain. No cough or congestion. She has had nauseousness or vomiting. She was seen in urgent care this morning and diagnosed with pyelonephritis. She states she was also told that it was just a virus. She comes here today she's continued to feel weak.  Past Medical History  Diagnosis Date  . Blood transfusion   . Heart murmur   . Hyperlipidemia   . Diabetes mellitus     takes Diovan for her kidneys  . Boils   . Abscess   . HTN (hypertension)   . Hx of adenomatous colonic polyps   . Glaucoma   . Osteoporosis     Past Surgical History  Procedure Date  . Colonoscopy   . Polypectomy   . Abdominal hysterectomy   . Tonsillectomy   . Carpal tunnel release     right  . Knee arthroscopy     right    Family History  Problem Relation Age of Onset  . Colon cancer Neg Hx   . Esophageal cancer Neg Hx   . Rectal cancer Neg Hx   . Stomach cancer Neg Hx   . Diabetes Mother   . Hypertension Mother     History  Substance Use Topics  . Smoking status: Former Games developer  . Smokeless tobacco: Never Used   Comment: smokes occ.   Marland Kitchen Alcohol Use: No    OB History    Grav Para Term  Preterm Abortions TAB SAB Ect Mult Living                  Review of Systems  Constitutional: Positive for fever, appetite change and fatigue.  HENT: Negative for ear pain, sore throat and trouble swallowing.   Eyes: Negative for itching.  Respiratory: Negative for cough.   Cardiovascular: Negative for chest pain.  Gastrointestinal: Positive for nausea and abdominal pain. Negative for vomiting and diarrhea.  Genitourinary: Positive for frequency. Negative for dysuria and flank pain.  Musculoskeletal: Negative for back pain.  Skin: Negative for pallor.  Neurological: Positive for light-headedness. Negative for syncope.  Psychiatric/Behavioral: Negative for confusion.    Allergies  Review of patient's allergies indicates no known allergies.  Home Medications   Current Outpatient Rx  Name Route Sig Dispense Refill  . ASPIRIN EC 81 MG PO TBEC Oral Take 81 mg by mouth daily.    . ATORVASTATIN CALCIUM 40 MG PO TABS Oral Take 40 mg by mouth at bedtime and may repeat dose one time if needed.    Marland Kitchen FLUTICASONE PROPIONATE 50 MCG/ACT NA SUSP Nasal Place 2 sprays into the nose daily as needed for rhinitis. 16 g 1  . INSULIN REGULAR HUMAN 100 UNIT/ML IJ SOLN Subcutaneous Inject 20 Units into the skin  3 (three) times daily before meals.      Marland Kitchen LEVOFLOXACIN 250 MG PO TABS Oral Take 250 mg by mouth daily. Started 9/11. Should finish on 9/16    . METFORMIN HCL 1000 MG PO TABS  1 tablet 2 (two) times daily.    Marland Kitchen ONDANSETRON HCL 4 MG PO TABS Oral Take 4 mg by mouth every 6 (six) hours. Scheduled as every 6 hours on prescription bottle    . SIMVASTATIN 40 MG PO TABS Oral Take 1 tablet by mouth Daily.     Marland Kitchen VALSARTAN-HYDROCHLOROTHIAZIDE 320-25 MG PO TABS Oral Take 1 tablet by mouth daily.      Marland Kitchen VITAMIN D (ERGOCALCIFEROL) 50000 UNITS PO CAPS Oral Take 1 capsule by mouth 2 (two) times a week.       BP 104/65  Pulse 124  Temp 99.1 F (37.3 C) (Oral)  Resp 18  SpO2 96%  Physical Exam    Constitutional: She is oriented to person, place, and time. She appears well-developed and well-nourished.  HENT:  Head: Normocephalic.  Eyes: Conjunctivae normal are normal. Pupils are equal, round, and reactive to light.  Cardiovascular:       tachycardia  Pulmonary/Chest: Effort normal and breath sounds normal.  Abdominal: Soft.       Mild diffuse tenderness without rebound or guarding  Musculoskeletal: Normal range of motion.  Neurological: She is alert and oriented to person, place, and time.  Skin: Skin is warm.    ED Course  Procedures (including critical care time)  Labs Reviewed  CBC WITH DIFFERENTIAL - Abnormal; Notable for the following:    WBC 20.1 (*)     Neutrophils Relative 86 (*)     Neutro Abs 17.2 (*)     Lymphocytes Relative 9 (*)     All other components within normal limits  COMPREHENSIVE METABOLIC PANEL - Abnormal; Notable for the following:    Glucose, Bld 112 (*)     Albumin 3.2 (*)     All other components within normal limits  URINALYSIS, ROUTINE W REFLEX MICROSCOPIC - Abnormal; Notable for the following:    Color, Urine AMBER (*)  BIOCHEMICALS MAY BE AFFECTED BY COLOR   APPearance CLOUDY (*)     Specific Gravity, Urine 1.037 (*)     Glucose, UA >1000 (*)     Hgb urine dipstick LARGE (*)     Bilirubin Urine SMALL (*)     Ketones, ur 40 (*)     Protein, ur >300 (*)     All other components within normal limits  URINE MICROSCOPIC-ADD ON - Abnormal; Notable for the following:    Squamous Epithelial / LPF MANY (*)     All other components within normal limits  LIPASE, BLOOD  LACTIC ACID, PLASMA  CULTURE, BLOOD (ROUTINE X 2)  CULTURE, BLOOD (ROUTINE X 2)  URINE CULTURE   Dg Chest 2 View  09/22/2011  *RADIOLOGY REPORT*  Clinical Data: Cough.  Fever.  Headache.  Dizziness.  CHEST - 2 VIEW  Comparison: None.  Findings: Cardiomediastinal silhouette unremarkable.  Linear atelectasis or scarring in the lingula.  Lungs otherwise clear. Bronchovascular  markings normal.  Pulmonary vascularity normal.  No pneumothorax.  No pleural effusions.  Minimal degenerative changes involving the thoracic spine.  IMPRESSION: Linear atelectasis or scar in the lingula.  No acute cardiopulmonary disease otherwise.   Original Report Authenticated By: Arnell Sieving, M.D.      No diagnosis found.    MDM  Patient with  fever. Diagnosed with pyelonephritis earlier today at urgent care and was given antibiotics. Patient's continued to feel bad. White count is elevated here. The urinalysis does not show a UTI. Patient does have some abdominal pains and CT scan will be done. Care will be turned over to Dr. Regino Bellow R. Rubin Payor, MD 09/24/11 9629

## 2011-09-24 ENCOUNTER — Emergency Department (HOSPITAL_COMMUNITY): Payer: BC Managed Care – PPO

## 2011-09-24 DIAGNOSIS — E782 Mixed hyperlipidemia: Secondary | ICD-10-CM

## 2011-09-24 DIAGNOSIS — D72829 Elevated white blood cell count, unspecified: Secondary | ICD-10-CM

## 2011-09-24 DIAGNOSIS — E1121 Type 2 diabetes mellitus with diabetic nephropathy: Secondary | ICD-10-CM

## 2011-09-24 DIAGNOSIS — E119 Type 2 diabetes mellitus without complications: Secondary | ICD-10-CM

## 2011-09-24 DIAGNOSIS — E785 Hyperlipidemia, unspecified: Secondary | ICD-10-CM

## 2011-09-24 DIAGNOSIS — J189 Pneumonia, unspecified organism: Principal | ICD-10-CM

## 2011-09-24 DIAGNOSIS — I10 Essential (primary) hypertension: Secondary | ICD-10-CM

## 2011-09-24 LAB — BASIC METABOLIC PANEL
BUN: 11 mg/dL (ref 6–23)
CO2: 23 mEq/L (ref 19–32)
Calcium: 8.7 mg/dL (ref 8.4–10.5)
Chloride: 104 mEq/L (ref 96–112)
Creatinine, Ser: 0.71 mg/dL (ref 0.50–1.10)
GFR calc Af Amer: >90 mL/min (ref >90)
GFR calc non Af Amer: 89 mL/min — ABNORMAL LOW (ref >90)
Glucose, Bld: 77 mg/dL (ref 70–99)
Potassium: 3.7 mEq/L (ref 3.5–5.1)
Sodium: 137 mEq/L (ref 135–145)

## 2011-09-24 LAB — CBC
HCT: 32.1 % — ABNORMAL LOW (ref 36.0–46.0)
Hemoglobin: 10.9 g/dL — ABNORMAL LOW (ref 12.0–15.0)
MCH: 31.2 pg (ref 26.0–34.0)
MCHC: 34 g/dL (ref 30.0–36.0)
MCV: 92 fL (ref 78.0–100.0)
Platelets: 238 10*3/uL (ref 150–400)
RBC: 3.49 MIL/uL — ABNORMAL LOW (ref 3.87–5.11)
RDW: 12.8 % (ref 11.5–15.5)
WBC: 15.1 10*3/uL — ABNORMAL HIGH (ref 4.0–10.5)

## 2011-09-24 LAB — GLUCOSE, CAPILLARY
Glucose-Capillary: 84 mg/dL (ref 70–99)
Glucose-Capillary: 73 mg/dL (ref 70–99)
Glucose-Capillary: 166 mg/dL — ABNORMAL HIGH (ref 70–99)
Glucose-Capillary: 181 mg/dL — ABNORMAL HIGH (ref 70–99)

## 2011-09-24 MED ORDER — INSULIN ASPART 100 UNIT/ML ~~LOC~~ SOLN
0.0000 [IU] | Freq: Every day | SUBCUTANEOUS | Status: DC
  Administered 2011-09-26: 3 [IU] via SUBCUTANEOUS

## 2011-09-24 MED ORDER — INSULIN REGULAR HUMAN 100 UNIT/ML IJ SOLN
20.0000 [IU] | Freq: Three times a day (TID) | INTRAMUSCULAR | Status: DC
  Filled 2011-09-24 (×27): qty 0.2

## 2011-09-24 MED ORDER — DEXTROSE 5 % IV SOLN
500.0000 mg | INTRAVENOUS | Status: DC
  Administered 2011-09-24 – 2011-09-26 (×3): 500 mg via INTRAVENOUS
  Filled 2011-09-24 (×4): qty 500

## 2011-09-24 MED ORDER — SENNOSIDES-DOCUSATE SODIUM 8.6-50 MG PO TABS
1.0000 | ORAL_TABLET | Freq: Every evening | ORAL | Status: DC | PRN
  Filled 2011-09-24: qty 1

## 2011-09-24 MED ORDER — SODIUM CHLORIDE 0.9 % IJ SOLN
3.0000 mL | Freq: Two times a day (BID) | INTRAMUSCULAR | Status: DC
  Administered 2011-09-24 – 2011-09-26 (×4): 3 mL via INTRAVENOUS

## 2011-09-24 MED ORDER — INSULIN ASPART 100 UNIT/ML ~~LOC~~ SOLN
20.0000 [IU] | Freq: Three times a day (TID) | SUBCUTANEOUS | Status: DC
  Administered 2011-09-24: 20 [IU] via SUBCUTANEOUS

## 2011-09-24 MED ORDER — ZOLPIDEM TARTRATE 5 MG PO TABS: 5.0000 mg | ORAL_TABLET | Freq: Every evening | ORAL | Status: DC | PRN

## 2011-09-24 MED ORDER — SODIUM CHLORIDE 0.9 % IJ SOLN: 3.0000 mL | Freq: Two times a day (BID) | INTRAMUSCULAR | Status: DC

## 2011-09-24 MED ORDER — ASPIRIN EC 81 MG PO TBEC
81.0000 mg | DELAYED_RELEASE_TABLET | Freq: Every day | ORAL | Status: DC
  Administered 2011-09-24 – 2011-09-27 (×4): 81 mg via ORAL
  Filled 2011-09-24 (×4): qty 1

## 2011-09-24 MED ORDER — HYDROCHLOROTHIAZIDE 25 MG PO TABS
25.0000 mg | ORAL_TABLET | Freq: Every day | ORAL | Status: DC
  Administered 2011-09-24 – 2011-09-27 (×4): 25 mg via ORAL
  Filled 2011-09-24 (×4): qty 1

## 2011-09-24 MED ORDER — IRBESARTAN 300 MG PO TABS
300.0000 mg | ORAL_TABLET | Freq: Every day | ORAL | Status: DC
  Administered 2011-09-24 – 2011-09-27 (×4): 300 mg via ORAL
  Filled 2011-09-24 (×4): qty 1

## 2011-09-24 MED ORDER — SODIUM CHLORIDE 0.9 % IJ SOLN: 3.0000 mL | INTRAMUSCULAR | Status: DC | PRN

## 2011-09-24 MED ORDER — ONDANSETRON HCL 4 MG PO TABS: 4.0000 mg | ORAL_TABLET | Freq: Four times a day (QID) | ORAL | Status: DC | PRN

## 2011-09-24 MED ORDER — BISACODYL 5 MG PO TBEC
10.0000 mg | DELAYED_RELEASE_TABLET | Freq: Once | ORAL | Status: AC
  Administered 2011-09-24: 10 mg via ORAL
  Filled 2011-09-24: qty 2

## 2011-09-24 MED ORDER — IPRATROPIUM BROMIDE 0.02 % IN SOLN: 0.5000 mg | RESPIRATORY_TRACT | Status: DC | PRN

## 2011-09-24 MED ORDER — SODIUM CHLORIDE 0.9 % IV SOLN: 250.0000 mL | INTRAVENOUS | Status: DC | PRN

## 2011-09-24 MED ORDER — SODIUM CHLORIDE 0.9 % IV SOLN
INTRAVENOUS | Status: DC
  Administered 2011-09-24: 100 mL/h via INTRAVENOUS

## 2011-09-24 MED ORDER — INSULIN ASPART 100 UNIT/ML ~~LOC~~ SOLN
15.0000 [IU] | Freq: Three times a day (TID) | SUBCUTANEOUS | Status: DC
  Administered 2011-09-24 – 2011-09-27 (×7): 15 [IU] via SUBCUTANEOUS

## 2011-09-24 MED ORDER — ACETAMINOPHEN 650 MG RE SUPP: 650.0000 mg | Freq: Four times a day (QID) | RECTAL | Status: DC | PRN

## 2011-09-24 MED ORDER — IOHEXOL 300 MG/ML  SOLN
20.0000 mL | INTRAMUSCULAR | Status: AC
  Administered 2011-09-24: 20 mL via ORAL

## 2011-09-24 MED ORDER — ATORVASTATIN CALCIUM 40 MG PO TABS
40.0000 mg | ORAL_TABLET | Freq: Every day | ORAL | Status: DC
  Administered 2011-09-24 – 2011-09-26 (×3): 40 mg via ORAL
  Filled 2011-09-24 (×4): qty 1

## 2011-09-24 MED ORDER — DEXTROSE 5 % IV SOLN
500.0000 mg | Freq: Once | INTRAVENOUS | Status: AC
  Administered 2011-09-24: 500 mg via INTRAVENOUS
  Filled 2011-09-24: qty 500

## 2011-09-24 MED ORDER — IOHEXOL 300 MG/ML  SOLN
100.0000 mL | Freq: Once | INTRAMUSCULAR | Status: AC | PRN
  Administered 2011-09-24: 100 mL via INTRAVENOUS

## 2011-09-24 MED ORDER — DEXTROSE 5 % IV SOLN
1.0000 g | INTRAVENOUS | Status: DC
  Administered 2011-09-25 (×2): 1 g via INTRAVENOUS
  Filled 2011-09-24 (×3): qty 10

## 2011-09-24 MED ORDER — ACETAMINOPHEN 325 MG PO TABS
650.0000 mg | ORAL_TABLET | Freq: Four times a day (QID) | ORAL | Status: DC | PRN
  Administered 2011-09-24 – 2011-09-27 (×8): 650 mg via ORAL
  Filled 2011-09-24 (×8): qty 2

## 2011-09-24 MED ORDER — VALSARTAN-HYDROCHLOROTHIAZIDE 320-25 MG PO TABS: 1.0000 | ORAL_TABLET | Freq: Every day | ORAL | Status: DC

## 2011-09-24 MED ORDER — POLYETHYLENE GLYCOL 3350 17 G PO PACK
68.0000 g | PACK | Freq: Once | ORAL | Status: AC
  Administered 2011-09-24: 68 g via ORAL
  Filled 2011-09-24: qty 4

## 2011-09-24 MED ORDER — HYDROCODONE-ACETAMINOPHEN 5-325 MG PO TABS: 1.0000 | ORAL_TABLET | ORAL | Status: DC | PRN

## 2011-09-24 MED ORDER — ONDANSETRON HCL 4 MG/2ML IJ SOLN: 4.0000 mg | Freq: Four times a day (QID) | INTRAMUSCULAR | Status: DC | PRN

## 2011-09-24 MED ORDER — ALUM & MAG HYDROXIDE-SIMETH 200-200-20 MG/5ML PO SUSP: 30.0000 mL | Freq: Four times a day (QID) | ORAL | Status: DC | PRN

## 2011-09-24 MED ORDER — ALBUTEROL SULFATE (5 MG/ML) 0.5% IN NEBU: 2.5000 mg | INHALATION_SOLUTION | RESPIRATORY_TRACT | Status: DC | PRN

## 2011-09-24 MED ORDER — INSULIN ASPART 100 UNIT/ML ~~LOC~~ SOLN
0.0000 [IU] | Freq: Three times a day (TID) | SUBCUTANEOUS | Status: DC
  Administered 2011-09-24: 2 [IU] via SUBCUTANEOUS
  Administered 2011-09-25: 5 [IU] via SUBCUTANEOUS
  Administered 2011-09-25: 1 [IU] via SUBCUTANEOUS
  Administered 2011-09-25 – 2011-09-26 (×2): 2 [IU] via SUBCUTANEOUS
  Administered 2011-09-26: 5 [IU] via SUBCUTANEOUS
  Administered 2011-09-27 (×2): 3 [IU] via SUBCUTANEOUS

## 2011-09-24 MED ORDER — DEXTROSE 5 % IV SOLN
1.0000 g | Freq: Once | INTRAVENOUS | Status: AC
  Administered 2011-09-24: 1 g via INTRAVENOUS
  Filled 2011-09-24: qty 10

## 2011-09-24 MED ORDER — ENOXAPARIN SODIUM 40 MG/0.4ML ~~LOC~~ SOLN
40.0000 mg | SUBCUTANEOUS | Status: DC
  Administered 2011-09-24 – 2011-09-27 (×4): 40 mg via SUBCUTANEOUS
  Filled 2011-09-24 (×4): qty 0.4

## 2011-09-24 NOTE — Progress Notes (Signed)
[facility] PROGRESS NOTE  Antionette Kallin WGN:[id number] DOB: Apr 24, 1945 DOA: 09/23/2011 PCP: [name], MD  Assessment/Plan: Active Problems:  Community acquired pneumonia  Diabetes mellitus type 2 in nonobese  Hyperlipidemia  Hypertension  Community Acquired Pneumonia:   + SIRS criteria and persistently febrile and tachycardic, however WBC trending down. -  F/u blood cultures -  Continue ceftriaxone and azithromycin -  Escalate antibiotics if decompensates -  Wean nasal canula as tolerated  T2DM:  Fingerstick in 80s -  Holding metformin -  Continue meal time insulin -  Consider adding lantus if needed  Hypertension:  Well controlled -  Cont. HCTZ 25 and irbesartan 300 -  Cont aspirin  HLD:  Stable.  Continue statin  DIET:  Healthy heart IVF:  NS at 100/h x 10 h ACCESS:  PIV PROPH:  Lovenox   Code Status: Full code Family Communication:  none Disposition Plan:  Patient meets SIRS criteria and appears clinically unwell.  Will monitor for 24 hours.  IF blood culture negative and patient improved on antibiotics (weaned to room air and tolerating ADLs) will discharge to home.    Brief narrative:  This is a 66 year old female who states she's had abdominal discomfort for approximately 4 days. She's been hurting the bilateral right and left lower quadrants. She has had fevers up to 103, she's been dizzy and tired. She went to urgent care yesterday where she was prescribed Levaquin and discharged home. Today she continued to feel badly with temperatures up to 103, she came back to the ER. She denies any shortness of breath, any cough, any wheezing, no chest pains. She denies any nausea, vomiting or diarrhea. She denies burning on urination or any confusion. She does states she's been weak. History provided by the patient   Consultants:  None  Procedures:  CXR 9/12  CT abd 9/13  Antibiotics:  Levaquin 9/12>>9/13  Ceftriaxone 9/13 >>  Azithromycin 9/13  >>  HPI/Subjective:  The patient states she feels very unwell today.  She is coughing and febrile and achy.  She denies nausea and vomiting.  She feels Kaiulani Sitton of breath and feels her heart is racing.    Objective: Filed Vitals:   09/24/11 0045 09/24/11 0246 09/24/11 0300 09/24/11 0415  BP:  130/63 125/50 102/66  Pulse: 53  107 104  Temp:  102.7 F (39.3 C)  100.1 F (37.8 C)  TempSrc:  Oral  Oral  Resp: 32 20 33 20  Height:    5' (1.524 m)  Weight:    72.7 kg (160 lb 4.4 oz)  SpO2: 98% 93% 94% 97%    Intake/Output Summary (Last 24 hours) at 09/24/11 0801 Last data filed at 09/23/11 2351  Gross per 24 hour  Intake   1000 ml  Output      0 ml  Net   1000 ml   Filed Weights   09/24/11 0415  Weight: 72.7 kg (160 lb 4.4 oz)    Exam:   General:  Overweight AAF, no acute distress  Cardiovascular: Tachycardic, regular rhythm, 1/6 vibratory murmur across precordium  Respiratory: Rales and diminished BS at the right base.  No wheezes or rhonchi  Abdomen: NABS, soft, nondistended, nontender  Ext:  No LEE  Data Reviewed: Basic Metabolic Panel:  Lab 09/24/11 7846 09/23/11 2145 09/22/11 1600  NA 137 135 136  K 3.7 3.8 4.5  CL 104 99 101  CO2 23 21 --  GLUCOSE 77 112* 297*  BUN 11 12 7   CREATININE  0.71 0.69 0.80  CALCIUM 8.7 9.6 --  MG -- -- --  PHOS -- -- --   Liver Function Tests:  Lab 09/23/11 2145  AST 30  ALT 12  ALKPHOS 72  BILITOT 0.3  PROT 7.7  ALBUMIN 3.2*    Lab 09/23/11 2144  LIPASE 23  AMYLASE --   No results found for this basename: AMMONIA:5 in the last 168 hours CBC:  Lab 09/24/11 0545 09/23/11 2145 09/22/11 1600  WBC 15.1* 20.1* --  NEUTROABS -- 17.2* --  HGB 10.9* 12.7 14.3  HCT 32.1* 37.1 42.0  MCV 92.0 91.6 --  PLT 238 282 --   Cardiac Enzymes: No results found for this basename: CKTOTAL:5,CKMB:5,CKMBINDEX:5,TROPONINI:5 in the last 168 hours BNP (last 3 results) No results found for this basename: PROBNP:3 in the last 8760  hours CBG:  Lab 09/24/11 0733  GLUCAP 84    Recent Results (from the past 240 hour(s))  URINE CULTURE     Status: Normal   Collection Time   09/22/11  7:10 PM      Component Value Range Status Comment   Specimen Description URINE, CLEAN CATCH   Final    Special Requests NONE   Final    Culture  Setup Time 09/22/2011 20:10   Final    Colony Count NO GROWTH   Final    Culture NO GROWTH   Final    Report Status 09/23/2011 FINAL   Final      Studies: Dg Chest 2 View  09/22/2011  *RADIOLOGY REPORT*  Clinical Data: Cough.  Fever.  Headache.  Dizziness.  CHEST - 2 VIEW  Comparison: None.  Findings: Cardiomediastinal silhouette unremarkable.  Linear atelectasis or scarring in the lingula.  Lungs otherwise clear. Bronchovascular markings normal.  Pulmonary vascularity normal.  No pneumothorax.  No pleural effusions.  Minimal degenerative changes involving the thoracic spine.  IMPRESSION: Linear atelectasis or scar in the lingula.  No acute cardiopulmonary disease otherwise.   Original Report Authenticated By: Arnell Sieving, M.D.    Ct Abdomen Pelvis W Contrast  09/24/2011  *RADIOLOGY REPORT*  Clinical Data: Abdominal pain and fever.  Previous hysterectomy.  CT ABDOMEN AND PELVIS WITH CONTRAST  Technique:  Multidetector CT imaging of the abdomen and pelvis was performed following the standard protocol during bolus administration of intravenous contrast.  Contrast: OMNIPAQUE IOHEXOL 300 MG/ML  SOLN  Comparison: Chest radiographs dated 09/22/2011.  Findings: Dense airspace consolidation in the posteromedial aspect of the right lower lobe.  Small amount of more patchy and nodular opacification elsewhere in the right lower lobe.  Minimal linear density in the lingula and left lower lobe.  Minimal diffuse low density of the liver relative to the spleen. Unremarkable spleen, pancreas, gallbladder, adrenal glands, kidneys and urinary bladder.  Normal appearing appendix in the right pelvis.  No  gastrointestinal abnormalities or enlarged lymph nodes. Mild lumbar and lower thoracic spine degenerative changes.  IMPRESSION:  1.  Dense right lower lobe atelectasis or pneumonia. 2.  Additional mild patchy and nodular opacity in the right lower lobe, suspicious for pneumonia. 3.  Small amount of linear atelectasis or scarring in the lingula and left lower lobe. 4.  Minimal diffuse hepatic steatosis.   Original Report Authenticated By: Darrol Angel, M.D.     Scheduled Meds:   . acetaminophen  650 mg Oral Once  . aspirin EC  81 mg Oral Daily  . atorvastatin  40 mg Oral q1800  . azithromycin  500 mg  Intravenous Once  . azithromycin  500 mg Intravenous Q24H  . cefTRIAXone (ROCEPHIN) IVPB 1 gram/50 mL D5W  1 g Intravenous Once  . cefTRIAXone (ROCEPHIN)  IV  1 g Intravenous Q24H  . enoxaparin (LOVENOX) injection  40 mg Subcutaneous Q24H  . hydrochlorothiazide  25 mg Oral Daily  . insulin aspart  20 Units Subcutaneous TID WC  . iohexol  20 mL Oral Q1 Hr x 2  . irbesartan  300 mg Oral Daily  . sodium chloride  1,000 mL Intravenous Once  . sodium chloride  3 mL Intravenous Q12H  . sodium chloride  3 mL Intravenous Q12H  . DISCONTD: insulin regular  20 Units Subcutaneous TID AC  . DISCONTD: valsartan-hydrochlorothiazide  1 tablet Oral Daily   Continuous Infusions:   Active Problems:  Community acquired pneumonia  Diabetes mellitus type 2 in nonobese  Hyperlipidemia  Hypertension    Time spent: 72    Ulonda Klosowski, [facility]  [facility] Pager [phone]. If 8PM-8AM, please contact night-coverage at [email or web address], password [facility] 09/24/2011, 8:01 AM  LOS: 1 day

## 2011-09-24 NOTE — ED Notes (Signed)
CT notified that pt has completed drinking oral contrast at this time

## 2011-09-24 NOTE — Progress Notes (Signed)
Utilization Review Completed.Sarah [name] T9/13/2013   

## 2011-09-24 NOTE — ED Notes (Addendum)
Patient transported to CT 

## 2011-09-24 NOTE — ED Notes (Signed)
RN Verlon Au informed of patient's temp

## 2011-09-24 NOTE — H&P (Signed)
PCP:   [name], MD   Chief Complaint:  Abdominal discomfort  HPI: This is a 66 year old female who states she's had abdominal discomfort for approximately 4 days. She's been hurting the bilateral right and left lower quadrants. She has had fevers up to 103, she's been dizzy and tired. She went to urgent care yesterday where she was prescribed Levaquin and discharged home. Today she continued to feel badly with temperatures up to 103, she came back to the ER. She denies any shortness of breath, any cough, any wheezing, no chest pains. She denies any nausea, vomiting or diarrhea. She denies burning on urination or any confusion. She does states she's been weak. History provided by the patient  Review of Systems:  The patient denies anorexia, weight loss,, vision loss, decreased hearing, hoarseness, chest pain, syncope, dyspnea on exertion, peripheral edema, balance deficits, hemoptysis, she is been in the in the he a the is a second and in melena, hematochezia, severe indigestion/heartburn, hematuria, incontinence, genital sores, muscle weakness, suspicious skin lesions, transient blindness, difficulty walking, depression, unusual weight change, abnormal bleeding, enlarged lymph nodes, angioedema, and breast masses.  Past Medical History: Past Medical History  Diagnosis Date  . Blood transfusion   . Heart murmur   . Hyperlipidemia   . Diabetes mellitus     takes Diovan for her kidneys  . Boils   . Abscess   . HTN (hypertension)   . Hx of adenomatous colonic polyps   . Glaucoma   . Osteoporosis    Past Surgical History  Procedure Date  . Colonoscopy   . Polypectomy   . Abdominal hysterectomy   . Tonsillectomy   . Carpal tunnel release     right  . Knee arthroscopy     right    Medications: Prior to Admission medications   Medication Sig Start Date End Date Taking? Authorizing Provider  aspirin EC 81 MG tablet Take 81 mg by mouth daily.   Yes Historical Provider, MD    atorvastatin (LIPITOR) 40 MG tablet Take 40 mg by mouth at bedtime and may repeat dose one time if needed.   Yes Historical Provider, MD  fluticasone (FLONASE) 50 MCG/ACT nasal spray Place 2 sprays into the nose daily as needed for rhinitis. 04/17/11  Yes Delanna Notice, MD  insulin regular (HUMULIN R,NOVOLIN R) 100 units/mL injection Inject 20 Units into the skin 3 (three) times daily before meals.     Yes Historical Provider, MD  levofloxacin (LEVAQUIN) 250 MG tablet Take 250 mg by mouth daily. Started 9/11. Should finish on 9/16   Yes Historical Provider, MD  metFORMIN (GLUCOPHAGE) 1000 MG tablet 1 tablet 2 (two) times daily. 09/14/10  Yes Historical Provider, MD  ondansetron (ZOFRAN) 4 MG tablet Take 4 mg by mouth every 6 (six) hours. Scheduled as every 6 hours on prescription bottle   Yes Historical Provider, MD  simvastatin (ZOCOR) 40 MG tablet Take 1 tablet by mouth Daily.  09/14/10  Yes Historical Provider, MD  valsartan-hydrochlorothiazide (DIOVAN-HCT) 320-25 MG per tablet Take 1 tablet by mouth daily.     Yes Historical Provider, MD  Vitamin D, Ergocalciferol, (DRISDOL) 50000 UNITS CAPS Take 1 capsule by mouth 2 (two) times a week.  11/18/10  Yes Historical Provider, MD    Allergies:  No Known Allergies  Social History:  reports that she has quit smoking. She has never used smokeless tobacco. She reports that she does not drink alcohol or use illicit drugs.  Family History: Family History  Problem Relation Age of Onset  . Colon cancer Neg Hx   . Esophageal cancer Neg Hx   . Rectal cancer Neg Hx   . Stomach cancer Neg Hx   . Diabetes Mother   . Hypertension Mother     Physical Exam: Filed Vitals:   09/23/11 1957 09/23/11 2113 09/23/11 2130 09/24/11 0001  BP:  108/56 104/65 144/78  Pulse:      Temp: 102.1 F (38.9 C) 99.1 F (37.3 C)  98.7 F (37.1 C)  TempSrc: Oral Oral  Oral  Resp:  20 18 20   SpO2:  92% 96% 100%    General:  Alert and oriented times three, well developed  and nourished, no acute distress Eyes: PERRLA, pink conjunctiva, no scleral icterus ENT: Moist oral mucosa, neck supple, no thyromegaly Lungs: clear to ascultation, no wheeze, no crackles, no use of accessory muscles Cardiovascular: regular rate and rhythm, no regurgitation, no gallops, no murmurs. No carotid bruits, no JVD Abdomen: soft, positive BS, non-tender, non-distended, no organomegaly, not an acute abdomen GU: not examined Neuro: CN II - XII grossly intact, sensation intact Musculoskeletal: strength 5/5 all extremities, no clubbing, cyanosis or edema Skin: no rash, no subcutaneous crepitation, no decubitus Psych: appropriate patient   Labs on Admission:   [facility] 09/23/11 2145 09/22/11 1600  NA 135 136  K 3.8 4.5  CL 99 101  CO2 21 --  GLUCOSE 112* 297*  BUN 12 7  CREATININE 0.69 0.80  CALCIUM 9.6 --  MG -- --  PHOS -- --    Basename 09/23/11 2145  AST 30  ALT 12  ALKPHOS 72  BILITOT 0.3  PROT 7.7  ALBUMIN 3.2*    Basename 09/23/11 2144  LIPASE 23  AMYLASE --    Basename 09/23/11 2145 09/22/11 1600  WBC 20.1* --  NEUTROABS 17.2* --  HGB 12.7 14.3  HCT 37.1 42.0  MCV 91.6 --  PLT 282 --   No results found for this basename: CKTOTAL:3,CKMB:3,CKMBINDEX:3,TROPONINI:3 in the last 72 hours No components found with this basename: POCBNP:3 No results found for this basename: DDIMER:2 in the last 72 hours No results found for this basename: HGBA1C:2 in the last 72 hours No results found for this basename: CHOL:2,HDL:2,LDLCALC:2,TRIG:2,CHOLHDL:2,LDLDIRECT:2 in the last 72 hours No results found for this basename: TSH,T4TOTAL,FREET3,T3FREE,THYROIDAB in the last 72 hours No results found for this basename: VITAMINB12:2,FOLATE:2,FERRITIN:2,TIBC:2,IRON:2,RETICCTPCT:2 in the last 72 hours  Micro Results: Recent Results (from the past 240 hour(s))  URINE CULTURE     Status: Normal   Collection Time   09/22/11  7:10 PM      Component Value Range Status Comment     Specimen Description URINE, CLEAN CATCH   Final    Special Requests NONE   Final    Culture  Setup Time 09/22/2011 20:10   Final    Colony Count NO GROWTH   Final    Culture NO GROWTH   Final    Report Status 09/23/2011 FINAL   Final   Results for Sarah [name], Sarah [name] (MRN [id number]) as of 09/24/2011 02:21  Ref. Range 09/23/2011 21:30  Color, Urine Latest Range: YELLOW  AMBER (A)  APPearance Latest Range: CLEAR  CLOUDY (A)  Specific Gravity, Urine Latest Range: 1.005-1.030  1.037 (H)  pH Latest Range: 5.0-8.0  6.0  Glucose Latest Range: NEGATIVE mg/dL >8413 (A)  Bilirubin Urine Latest Range: NEGATIVE  SMALL (A)  Ketones, ur Latest Range: NEGATIVE mg/dL 40 (A)  Protein Latest Range: NEGATIVE mg/dL >244 (A)  Urobilinogen, UA Latest Range: 0.0-1.0 mg/dL 1.0  Nitrite Latest Range: NEGATIVE  NEGATIVE  Leukocytes, UA Latest Range: NEGATIVE  NEGATIVE  Hgb urine dipstick Latest Range: NEGATIVE  LARGE (A)  Urine-Other No range found MUCOUS PRESENT  WBC, UA Latest Range: <3 WBC/hpf 0-2  RBC / HPF Latest Range: <3 RBC/hpf 11-20  Squamous Epithelial / LPF Latest Range: RARE  MANY (A)  Bacteria, UA Latest Range: RARE  RARE     Radiological Exams on Admission: Dg Chest 2 View  09/22/2011  *RADIOLOGY REPORT*  Clinical Data: Cough.  Fever.  Headache.  Dizziness.  CHEST - 2 VIEW  Comparison: None.  Findings: Cardiomediastinal silhouette unremarkable.  Linear atelectasis or scarring in the lingula.  Lungs otherwise clear. Bronchovascular markings normal.  Pulmonary vascularity normal.  No pneumothorax.  No pleural effusions.  Minimal degenerative changes involving the thoracic spine.  IMPRESSION: Linear atelectasis or scar in the lingula.  No acute cardiopulmonary disease otherwise.   Original Report Authenticated By: Arnell Sieving, M.D.    Ct Abdomen Pelvis W Contrast  09/24/2011  *RADIOLOGY REPORT*  Clinical Data: Abdominal pain and fever.  Previous hysterectomy.  CT ABDOMEN AND PELVIS WITH  CONTRAST  Technique:  Multidetector CT imaging of the abdomen and pelvis was performed following the standard protocol during bolus administration of intravenous contrast.  Contrast: OMNIPAQUE IOHEXOL 300 MG/ML  SOLN  Comparison: Chest radiographs dated 09/22/2011.  Findings: Dense airspace consolidation in the posteromedial aspect of the right lower lobe.  Small amount of more patchy and nodular opacification elsewhere in the right lower lobe.  Minimal linear density in the lingula and left lower lobe.  Minimal diffuse low density of the liver relative to the spleen. Unremarkable spleen, pancreas, gallbladder, adrenal glands, kidneys and urinary bladder.  Normal appearing appendix in the right pelvis.  No gastrointestinal abnormalities or enlarged lymph nodes. Mild lumbar and lower thoracic spine degenerative changes.  IMPRESSION:  1.  Dense right lower lobe atelectasis or pneumonia. 2.  Additional mild patchy and nodular opacity in the right lower lobe, suspicious for pneumonia. 3.  Small amount of linear atelectasis or scarring in the lingula and left lower lobe. 4.  Minimal diffuse hepatic steatosis.   Original Report Authenticated By: Darrol Angel, M.D.     Assessment/Plan Present on Admission:  .Community acquired pneumonia Admit to telemetry Blood cultures and urine cultures ordered Rocephin and azithromycin ordered Oxygen and duo nebs Tylenol and Tessalon Perles  when necessary Diabetes mellitus Hypertension Dyslipidemia Stable resume home medication except for metformin as patient has received contrast ADA diet and sensitive insulin    Full code DVT prophylaxis    Madisynn Plair 09/24/2011, 2:26 AM

## 2011-09-25 DIAGNOSIS — I10 Essential (primary) hypertension: Secondary | ICD-10-CM

## 2011-09-25 DIAGNOSIS — R000 Tachycardia, unspecified: Secondary | ICD-10-CM

## 2011-09-25 DIAGNOSIS — E785 Hyperlipidemia, unspecified: Secondary | ICD-10-CM

## 2011-09-25 DIAGNOSIS — R509 Fever, unspecified: Secondary | ICD-10-CM

## 2011-09-25 LAB — STREP PNEUMONIAE URINARY ANTIGEN: Strep Pneumo Urinary Antigen: NEGATIVE

## 2011-09-25 LAB — GLUCOSE, CAPILLARY
Glucose-Capillary: 264 mg/dL — ABNORMAL HIGH (ref 70–99)
Glucose-Capillary: 173 mg/dL — ABNORMAL HIGH (ref 70–99)
Glucose-Capillary: 139 mg/dL — ABNORMAL HIGH (ref 70–99)
Glucose-Capillary: 180 mg/dL — ABNORMAL HIGH (ref 70–99)

## 2011-09-25 LAB — BASIC METABOLIC PANEL
BUN: 13 mg/dL (ref 6–23)
CO2: 25 mEq/L (ref 19–32)
Calcium: 8.8 mg/dL (ref 8.4–10.5)
Chloride: 99 mEq/L (ref 96–112)
Creatinine, Ser: 0.59 mg/dL (ref 0.50–1.10)
GFR calc Af Amer: >90 mL/min (ref >90)
GFR calc non Af Amer: >90 mL/min (ref >90)
Glucose, Bld: 262 mg/dL — ABNORMAL HIGH (ref 70–99)
Potassium: 3.6 mEq/L (ref 3.5–5.1)
Sodium: 136 mEq/L (ref 135–145)

## 2011-09-25 LAB — CBC
HCT: 31 % — ABNORMAL LOW (ref 36.0–46.0)
Hemoglobin: 10.5 g/dL — ABNORMAL LOW (ref 12.0–15.0)
MCH: 31.3 pg (ref 26.0–34.0)
MCHC: 33.9 g/dL (ref 30.0–36.0)
MCV: 92.3 fL (ref 78.0–100.0)
Platelets: 266 10*3/uL (ref 150–400)
RBC: 3.36 MIL/uL — ABNORMAL LOW (ref 3.87–5.11)
RDW: 13.3 % (ref 11.5–15.5)
WBC: 9.5 10*3/uL (ref 4.0–10.5)

## 2011-09-25 LAB — URINE CULTURE
Colony Count: NO GROWTH
Culture: NO GROWTH

## 2011-09-25 MED ORDER — AZITHROMYCIN 250 MG PO TABS: 250.0000 mg | ORAL_TABLET | Freq: Every day | ORAL | Status: DC

## 2011-09-25 MED ORDER — SODIUM CHLORIDE 0.9 % IV SOLN
INTRAVENOUS | Status: DC
  Administered 2011-09-25 – 2011-09-27 (×2): via INTRAVENOUS

## 2011-09-25 MED ORDER — INSULIN GLARGINE 100 UNIT/ML ~~LOC~~ SOLN
20.0000 [IU] | Freq: Every day | SUBCUTANEOUS | Status: DC
  Administered 2011-09-25: 20 [IU] via SUBCUTANEOUS

## 2011-09-25 MED ORDER — SODIUM CHLORIDE 0.9 % IV BOLUS (SEPSIS)
1000.0000 mL | Freq: Once | INTRAVENOUS | Status: AC
  Administered 2011-09-25: 1000 mL via INTRAVENOUS

## 2011-09-25 MED ORDER — AMOXICILLIN-POT CLAVULANATE 875-125 MG PO TABS: 1.0000 | ORAL_TABLET | Freq: Two times a day (BID) | ORAL | Status: AC

## 2011-09-25 NOTE — Progress Notes (Signed)
[facility] PROGRESS NOTE  Letishia Elliott ZOX:[id number] DOB: 1945-11-04 DOA: 09/23/2011 PCP: [name], MD  Assessment/Plan: Active Problems:  Community acquired pneumonia  Diabetes mellitus type 2 in nonobese  Hyperlipidemia  Hypertension  Community Acquired Pneumonia:   + SIRS criteria and persistently febrile and tachycardic, however WBC trending down.  Patient at higher risk for complication and readmission due to persistent fever > 38.1 and HR > 100.   -  F/u blood cultures -  Continue ceftriaxone and azithromycin -  Escalate antibiotics if decompensates -  If no improvement by tomorrow (48 hours), will check chest CT and consider escalating antibiotics.  T2DM:  Fingerstick rose to > 200 this morning -  Holding metformin -  Continue meal time insulin -  Add lantus 20 units and titrate as needed  Hypertension:  Well controlled but rises with fevers. -  Continue HCTZ and irbesartan 300 -  Cont aspirin  HLD:  Stable.  Continue statin  DIET:  Healthy heart IVF:  NS at 100/h x 10 h ACCESS:  PIV PROPH:  Lovenox   Code Status: Full code Family Communication:  none Disposition Plan:  Patient meets SIRS criteria and appears clinically unwell.  High risk of complication and readmission as T>38.1 and HR > 100.  Reassess at 48 hours of therapy.  Brief narrative:  This is a 66 year old female who states she's had abdominal discomfort for approximately 4 days. She's been hurting the bilateral right and left lower quadrants. She has had fevers up to 103, she's been dizzy and tired. She went to urgent care yesterday where she was prescribed Levaquin and discharged home. Today she continued to feel badly with temperatures up to 103, she came back to the ER. She denies any shortness of breath, any cough, any wheezing, no chest pains. She denies any nausea, vomiting or diarrhea. She denies burning on urination or any confusion. She does states she's been weak. History  provided by the patient   Consultants:  None  Procedures:  CXR 9/12  CT abd 9/13  Antibiotics:  Levaquin 9/12>>9/13  Ceftriaxone 9/13 >>  Azithromycin 9/13 >>  HPI/Subjective:  The patient states she continues to feel unwell today.  She is coughing and febrile and achy.  She denies nausea and vomiting.  She had night sweats last night   Objective: Filed Vitals:   09/24/11 1700 09/24/11 2114 09/25/11 0446 09/25/11 1435  BP:  122/53 105/62 153/74  Pulse:  101 97 108  Temp: 98.8 F (37.1 C) 100.1 F (37.8 C) 98.2 F (36.8 C) 102.8 F (39.3 C)  TempSrc: Oral Oral Oral Oral  Resp:  20 19 20   Height:      Weight:      SpO2:  96% 94% 92%    Intake/Output Summary (Last 24 hours) at 09/25/11 1509 Last data filed at 09/25/11 1437  Gross per 24 hour  Intake    583 ml  Output      0 ml  Net    583 ml   Filed Weights   09/24/11 0415  Weight: 72.7 kg (160 lb 4.4 oz)    Exam:   General:  Overweight AAF, no acute distress.  Diaphoretic  Cardiovascular: Tachycardic, regular rhythm, 1/6 vibratory murmur across precordium  Respiratory: Rales and bronchial BS at the right base.  No wheezes or rhonchi  Abdomen: NABS, soft, nondistended, nontender  Ext:  No LEE  Psych:  A&Ox4  Data Reviewed: Basic Metabolic Panel:  Lab 09/25/11 8119 09/24/11 0545  09/23/11 2145 09/22/11 1600  NA 136 137 135 136  K 3.6 3.7 3.8 4.5  CL 99 104 99 101  CO2 25 23 21  --  GLUCOSE 262* 77 112* 297*  BUN 13 11 12 7   CREATININE 0.59 0.71 0.69 0.80  CALCIUM 8.8 8.7 9.6 --  MG -- -- -- --  PHOS -- -- -- --   Liver Function Tests:  Lab 09/23/11 2145  AST 30  ALT 12  ALKPHOS 72  BILITOT 0.3  PROT 7.7  ALBUMIN 3.2*    Lab 09/23/11 2144  LIPASE 23  AMYLASE --   No results found for this basename: AMMONIA:5 in the last 168 hours CBC:  Lab 09/25/11 0650 09/24/11 0545 09/23/11 2145 09/22/11 1600  WBC 9.5 15.1* 20.1* --  NEUTROABS -- -- 17.2* --  HGB 10.5* 10.9* 12.7  14.3  HCT 31.0* 32.1* 37.1 42.0  MCV 92.3 92.0 91.6 --  PLT 266 238 282 --   Cardiac Enzymes: No results found for this basename: CKTOTAL:5,CKMB:5,CKMBINDEX:5,TROPONINI:5 in the last 168 hours BNP (last 3 results) No results found for this basename: PROBNP:3 in the last 8760 hours CBG:  Lab 09/25/11 1108 09/25/11 0602 09/24/11 2112 09/24/11 1632 09/24/11 1201  GLUCAP 173* 264* 181* 166* 73    Recent Results (from the past 240 hour(s))  URINE CULTURE     Status: Normal   Collection Time   09/22/11  7:10 PM      Component Value Range Status Comment   Specimen Description URINE, CLEAN CATCH   Final    Special Requests NONE   Final    Culture  Setup Time 09/22/2011 20:10   Final    Colony Count NO GROWTH   Final    Culture NO GROWTH   Final    Report Status 09/23/2011 FINAL   Final   CULTURE, BLOOD (ROUTINE X 2)     Status: Normal (Preliminary result)   Collection Time   09/23/11  9:24 PM      Component Value Range Status Comment   Specimen Description BLOOD LEFT ARM   Final    Special Requests BOTTLES DRAWN AEROBIC AND ANAEROBIC 10CC   Final    Culture  Setup Time 09/24/2011 04:30   Final    Culture     Final    Value:        BLOOD CULTURE RECEIVED NO GROWTH TO DATE CULTURE WILL BE HELD FOR 5 DAYS BEFORE ISSUING A FINAL NEGATIVE REPORT   Report Status PENDING   Incomplete   URINE CULTURE     Status: Normal   Collection Time   09/23/11  9:30 PM      Component Value Range Status Comment   Specimen Description URINE, CLEAN CATCH   Final    Special Requests NONE   Final    Culture  Setup Time 09/24/2011 00:41   Final    Colony Count NO GROWTH   Final    Culture NO GROWTH   Final    Report Status 09/25/2011 FINAL   Final   CULTURE, BLOOD (ROUTINE X 2)     Status: Normal (Preliminary result)   Collection Time   09/23/11  9:54 PM      Component Value Range Status Comment   Specimen Description BLOOD RIGHT HAND   Final    Special Requests BOTTLES DRAWN AEROBIC ONLY   Final     Culture  Setup Time 09/24/2011 04:31   Final    Culture  Final    Value:        BLOOD CULTURE RECEIVED NO GROWTH TO DATE CULTURE WILL BE HELD FOR 5 DAYS BEFORE ISSUING A FINAL NEGATIVE REPORT   Report Status PENDING   Incomplete      Studies: Ct Abdomen Pelvis W Contrast  09/24/2011  *RADIOLOGY REPORT*  Clinical Data: Abdominal pain and fever.  Previous hysterectomy.  CT ABDOMEN AND PELVIS WITH CONTRAST  Technique:  Multidetector CT imaging of the abdomen and pelvis was performed following the standard protocol during bolus administration of intravenous contrast.  Contrast: OMNIPAQUE IOHEXOL 300 MG/ML  SOLN  Comparison: Chest radiographs dated 09/22/2011.  Findings: Dense airspace consolidation in the posteromedial aspect of the right lower lobe.  Small amount of more patchy and nodular opacification elsewhere in the right lower lobe.  Minimal linear density in the lingula and left lower lobe.  Minimal diffuse low density of the liver relative to the spleen. Unremarkable spleen, pancreas, gallbladder, adrenal glands, kidneys and urinary bladder.  Normal appearing appendix in the right pelvis.  No gastrointestinal abnormalities or enlarged lymph nodes. Mild lumbar and lower thoracic spine degenerative changes.  IMPRESSION:  1.  Dense right lower lobe atelectasis or pneumonia. 2.  Additional mild patchy and nodular opacity in the right lower lobe, suspicious for pneumonia. 3.  Small amount of linear atelectasis or scarring in the lingula and left lower lobe. 4.  Minimal diffuse hepatic steatosis.   Original Report Authenticated By: Darrol Angel, M.D.     Scheduled Meds:    . aspirin EC  81 mg Oral Daily  . atorvastatin  40 mg Oral q1800  . azithromycin  500 mg Intravenous Q24H  . cefTRIAXone (ROCEPHIN)  IV  1 g Intravenous Q24H  . enoxaparin (LOVENOX) injection  40 mg Subcutaneous Q24H  . hydrochlorothiazide  25 mg Oral Daily  . insulin aspart  0-5 Units Subcutaneous QHS  . insulin  aspart  0-9 Units Subcutaneous TID WC  . insulin aspart  15 Units Subcutaneous TID WC  . irbesartan  300 mg Oral Daily  . sodium chloride  1,000 mL Intravenous Once  . sodium chloride  3 mL Intravenous Q12H  . sodium chloride  3 mL Intravenous Q12H  . DISCONTD: insulin glargine  20 Units Subcutaneous Daily   Continuous Infusions:    . sodium chloride Stopped (09/24/11 1822)    Active Problems:  Community acquired pneumonia  Diabetes mellitus type 2 in nonobese  Hyperlipidemia  Hypertension    Time spent: 46    Abdulloh Ullom, [facility]  [facility] Pager [phone]. If 8PM-8AM, please contact night-coverage at [email or web address], password [facility] 09/25/2011, 3:09 PM  LOS: 2 days

## 2011-09-25 NOTE — Discharge Summary (Addendum)
Physician Discharge Summary  Sarah [name] PPI:[id number] DOB: 06-Mar-1945 DOA: 09/23/2011  PCP: [name], MD  Admit date: 09/23/2011 Discharge date: 09/27/2011  Recommendations for Outpatient Follow-up:  Please follow up with primary care doctor within 1 week of discharge.  You will need repeat blood work, including CBC.  Please follow up on final results of two blood cultures that were drawn on 9/12 and urine legionella antigen which is pending.  Please adjust insulin as patient started on lantus for hyperglycemia due to acute illness.    Discharge Diagnoses:  Active Problems:  Community acquired pneumonia  Diabetes mellitus type 2 in nonobese  Hyperlipidemia  Hypertension  Leukocytosis  Sinus tachycardia  Fever  Anemia   Discharge Condition: stable, improved  Diet recommendation: carbohydrate controlled, 2gm sodium  Wt Readings from Last 3 Encounters:  09/24/11 72.7 kg (160 lb 4.4 oz)  03/02/11 75.569 kg (166 lb 9.6 oz)  02/09/11 72.031 kg (158 lb 12.8 oz)    History of present illness:   This is a 66 year old female who states she's had abdominal discomfort for approximately 4 days. She's been hurting the bilateral right and left lower quadrants. She has had fevers up to 103, she's been dizzy and tired. She went to urgent care yesterday where she was prescribed Levaquin and discharged home. Today she continued to feel badly with temperatures up to 103, she came back to the ER. She denies any shortness of breath, any cough, any wheezing, no chest pains. She denies any nausea, vomiting or diarrhea. She denies burning on urination or any confusion. She does states she's been weak. History provided by the patient.   Hospital Course:   [name] was admitted to the hospital with shortness of breath, fever, high white blood cell count, and sinus tachycardia.  She was started on IV fluids and ceftriaxone and azithromycin.  After 48 hours, she continued to have fevers to  almost 103F intermittently.  She was started on vancomycin and zosyn and she had a repeat CXR which showed only interstitial pneumonia, however, she had dense bronchial breath sounds on the right lower lobe.  She had a CT scan which showed RLL PNA that was likely hidden behind the heart.  The radiology report indicated nodularity that was suggestive of fungal infection, however, after consultation with Dr. Orvan Falconer, Infectious Disease, who reviewed her case and images, she most likely has community acquired pneumonia.  After 24 hours, her vancomycin and zosyn were discontinued.  She was MRSA nare negative and her blood cultures were negative and she is not at risk for aspiration.  Her HR trended down to the 80s and low 90s and although she continued to have intermittent low fevers, they were less frequent and less high.  She completed 1.5gm of azithromycin and should not take this at home.  Her levofloxacin dose previously prescribed was inadequate for her current infection and she was given a prescription for augmentin to complete > 7 day course.  She was advised to stay hydrated, take tylenol for fevers and continue her antibiotic until gone.  She was advised that if she were to develop 4-5 very watery bowel movements daily that she should call her primary care doctor immediately as this can be a sign of C. Dif infection.    Her metformin was held during admission and she was given sliding scale insulin in addition to her meal-time insulin.  Due to hyperglycemia, she was started on lantus which she should continue for a few days until  her fingersticks trend down as she recovers from illness.  Her primary care doctor can adjust the dose as needed.   Finally, she developed anemia while hospitalized, likely due to marrow suppression from acute febrile illness or viral illness.  She should have repeat CBC checked by her primary care doctor within one week of dischrage.     Procedures:  CT abd/pelvis  9/13  CXR 9/11  Consultations:  None  Discharge Exam: Filed Vitals:   09/27/11 0815  BP:   Pulse: 87  Temp: 98.7 F (37.1 C)  Resp:    Filed Vitals:   09/26/11 1921 09/26/11 2018 09/27/11 0419 09/27/11 0815  BP:  134/52 138/55   Pulse:  95 97 87  Temp: 100.2 F (37.9 C) 99 F (37.2 C) 99.3 F (37.4 C) 98.7 F (37.1 C)  TempSrc:  Oral Oral Oral  Resp:  18 18   Height:      Weight:      SpO2:  93% 95% 97%   General: Overweight AAF, no acute distress  HEENT:  MMM Cardiovascular:  Regular rate and rhythm, no murmurs, rubs or gallops Respiratory: Rales and diminished and bronchial BS at the right base. No wheezes or rhonchi  Abdomen: NABS, soft, nondistended, nontender  Ext: No LEE   Discharge Instructions      Discharge Orders    Future Orders Please Complete By Expires   Diet - low sodium heart healthy      Increase activity slowly      Call MD for:      Comments:   Call 911 if you experience chest pain with shortness of breath or symptoms of stroke, including slurred speech, facial droop, numbness or weakness of an arm or leg or confusion.   Call MD for:  temperature >100.4      Call MD for:  persistant nausea and vomiting      Call MD for:  severe uncontrolled pain      Call MD for:  difficulty breathing, headache or visual disturbances      Call MD for:  hives      Call MD for:  extreme fatigue      Call MD for:  persistant dizziness or light-headedness      Discharge instructions      Comments:   You were hospitalized with community acquired pneumonia.  You were started on two antibiotics, ceftriaxone and azithromycin and your fever, and your white blood cell count (a marker of infection) initially improved, but your heart rate remained > 100 and you continued to have fevers to almost 103F after 48 hours of therapy.  You had a repeat CXR which showed interstitial pneumonia, but a follow up CT chest showed a dense pneumonia that was hidden behind the heart  (but clearly audible on exam).  You did not have fluid around your lung, only in it.  You were temporarily started on vancomycin and zosyn, but most likely you have community acquired pneumonia.  Your temperature and heart rate came down over the last 24 hours.  You should continue Augmentin at home until gone, first dose tonight.  Stop taking the levaquin - the dose you were prescribed was not potent enough to treat this kind of infection.  It is normal to have fevers for several days - use tylenol as needed.  Please drink at least 3 liters of fluids per day.  Follow up with your primary care doctor within one week.  Return to the  hospital if you feel your breathing or fevers are worsening.  Please continue lantus for a few days at home because your illness makes your blood sugars higher.  Stop your metformin and take your fingersticks four times a day until you follow up with your primary care doctor.  Please call your primary care doctor if you fingersticks remain greater than 250 or fall less than 70.  Also, you developed a mild anemia while in the hospital which may be due to your illness.  Please follow up with your primary care doctor to have this rechecked at a later date.       Medication List     As of 09/27/2011 10:44 AM    STOP taking these medications         levofloxacin 250 MG tablet   Commonly known as: LEVAQUIN      metFORMIN 1000 MG tablet   Commonly known as: GLUCOPHAGE      TAKE these medications         amoxicillin-clavulanate 875-125 MG per tablet   Commonly known as: AUGMENTIN   Take 1 tablet by mouth 2 (two) times daily.      aspirin EC 81 MG tablet   Take 81 mg by mouth daily.      atorvastatin 40 MG tablet   Commonly known as: LIPITOR   Take 40 mg by mouth at bedtime and may repeat dose one time if needed.      Dextromethorphan-Guaifenesin 60-1200 MG per 12 hr tablet   Take 1 tablet by mouth every 12 (twelve) hours.      fluticasone 50 MCG/ACT nasal spray    Commonly known as: FLONASE   Place 2 sprays into the nose daily as needed for rhinitis.      insulin glargine 100 UNIT/ML injection   Commonly known as: LANTUS   Inject 30 Units into the skin daily.      insulin regular 100 units/mL injection   Commonly known as: NOVOLIN R,HUMULIN R   Inject 20 Units into the skin 3 (three) times daily before meals.      ondansetron 4 MG tablet   Commonly known as: ZOFRAN   Take 4 mg by mouth every 6 (six) hours. Scheduled as every 6 hours on prescription bottle      simvastatin 40 MG tablet   Commonly known as: ZOCOR   Take 1 tablet by mouth Daily.      valsartan-hydrochlorothiazide 320-25 MG per tablet   Commonly known as: DIOVAN-HCT   Take 1 tablet by mouth daily.      Vitamin D (Ergocalciferol) 50000 UNITS Caps   Commonly known as: DRISDOL   Take 1 capsule by mouth 2 (two) times a week.         Follow-up Information    Follow up with [name], MD. In 1 week. (at your already scheduled appointment)    Contact information:   [address] [facility] [place] [address] [phone]           The results of significant diagnostics from this hospitalization (including imaging, microbiology, ancillary and laboratory) are listed below for reference.    Significant Diagnostic Studies: Dg Chest 2 View  09/22/2011  *RADIOLOGY REPORT*  Clinical Data: Cough.  Fever.  Headache.  Dizziness.  CHEST - 2 VIEW  Comparison: None.  Findings: Cardiomediastinal silhouette unremarkable.  Linear atelectasis or scarring in the lingula.  Lungs otherwise clear. Bronchovascular markings normal.  Pulmonary vascularity normal.  No pneumothorax.  No pleural effusions.  Minimal degenerative changes involving the thoracic spine.  IMPRESSION: Linear atelectasis or scar in the lingula.  No acute cardiopulmonary disease otherwise.   Original Report Authenticated By: Arnell Sieving, M.D.    Ct Abdomen Pelvis W  Contrast  09/24/2011  *RADIOLOGY REPORT*  Clinical Data: Abdominal pain and fever.  Previous hysterectomy.  CT ABDOMEN AND PELVIS WITH CONTRAST  Technique:  Multidetector CT imaging of the abdomen and pelvis was performed following the standard protocol during bolus administration of intravenous contrast.  Contrast: OMNIPAQUE IOHEXOL 300 MG/ML  SOLN  Comparison: Chest radiographs dated 09/22/2011.  Findings: Dense airspace consolidation in the posteromedial aspect of the right lower lobe.  Small amount of more patchy and nodular opacification elsewhere in the right lower lobe.  Minimal linear density in the lingula and left lower lobe.  Minimal diffuse low density of the liver relative to the spleen. Unremarkable spleen, pancreas, gallbladder, adrenal glands, kidneys and urinary bladder.  Normal appearing appendix in the right pelvis.  No gastrointestinal abnormalities or enlarged lymph nodes. Mild lumbar and lower thoracic spine degenerative changes.  IMPRESSION:  1.  Dense right lower lobe atelectasis or pneumonia. 2.  Additional mild patchy and nodular opacity in the right lower lobe, suspicious for pneumonia. 3.  Small amount of linear atelectasis or scarring in the lingula and left lower lobe. 4.  Minimal diffuse hepatic steatosis.   Original Report Authenticated By: Darrol Angel, M.D.     Microbiology: Recent Results (from the past 240 hour(s))  URINE CULTURE     Status: Normal   Collection Time   09/22/11  7:10 PM      Component Value Range Status Comment   Specimen Description URINE, CLEAN CATCH   Final    Special Requests NONE   Final    Culture  Setup Time 09/22/2011 20:10   Final    Colony Count NO GROWTH   Final    Culture NO GROWTH   Final    Report Status 09/23/2011 FINAL   Final   CULTURE, BLOOD (ROUTINE X 2)     Status: Normal (Preliminary result)   Collection Time   09/23/11  9:24 PM      Component Value Range Status Comment   Specimen Description BLOOD LEFT ARM   Final     Special Requests BOTTLES DRAWN AEROBIC AND ANAEROBIC 10CC   Final    Culture  Setup Time 09/24/2011 04:30   Final    Culture     Final    Value:        BLOOD CULTURE RECEIVED NO GROWTH TO DATE CULTURE WILL BE HELD FOR 5 DAYS BEFORE ISSUING A FINAL NEGATIVE REPORT   Report Status PENDING   Incomplete   URINE CULTURE     Status: Normal   Collection Time   09/23/11  9:30 PM      Component Value Range Status Comment   Specimen Description URINE, CLEAN CATCH   Final    Special Requests NONE   Final    Culture  Setup Time 09/24/2011 00:41   Final    Colony Count NO GROWTH   Final    Culture NO GROWTH   Final    Report Status 09/25/2011 FINAL   Final   CULTURE, BLOOD (ROUTINE X 2)     Status: Normal (Preliminary result)   Collection Time   09/23/11  9:54 PM      Component Value Range Status Comment  Specimen Description BLOOD RIGHT HAND   Final    Special Requests BOTTLES DRAWN AEROBIC ONLY   Final    Culture  Setup Time 09/24/2011 04:31   Final    Culture     Final    Value:        BLOOD CULTURE RECEIVED NO GROWTH TO DATE CULTURE WILL BE HELD FOR 5 DAYS BEFORE ISSUING A FINAL NEGATIVE REPORT   Report Status PENDING   Incomplete   CULTURE, BLOOD (ROUTINE X 2)     Status: Normal (Preliminary result)   Collection Time   09/26/11  9:59 AM      Component Value Range Status Comment   Specimen Description BLOOD LEFT ARM   Final    Special Requests BOTTLES DRAWN AEROBIC AND ANAEROBIC 10CC   Final    Culture  Setup Time 09/26/2011 13:31   Final    Culture     Final    Value:        BLOOD CULTURE RECEIVED NO GROWTH TO DATE CULTURE WILL BE HELD FOR 5 DAYS BEFORE ISSUING A FINAL NEGATIVE REPORT   Report Status PENDING   Incomplete   CULTURE, BLOOD (ROUTINE X 2)     Status: Normal (Preliminary result)   Collection Time   09/26/11 10:05 AM      Component Value Range Status Comment   Specimen Description BLOOD LEFT HAND   Final    Special Requests BOTTLES DRAWN AEROBIC AND ANAEROBIC 10CC   Final     Culture  Setup Time 09/26/2011 13:31   Final    Culture     Final    Value:        BLOOD CULTURE RECEIVED NO GROWTH TO DATE CULTURE WILL BE HELD FOR 5 DAYS BEFORE ISSUING A FINAL NEGATIVE REPORT   Report Status PENDING   Incomplete      Labs: Basic Metabolic Panel:  Lab 09/27/11 1610 09/26/11 0445 09/25/11 0650 09/24/11 0545 09/23/11 2145  NA 137 137 136 137 135  K 3.5 3.7 3.6 3.7 3.8  CL 101 102 99 104 99  CO2 26 25 25 23 21   GLUCOSE 233* 240* 262* 77 112*  BUN 5* 8 13 11 12   CREATININE 0.54 0.51 0.59 0.71 0.69  CALCIUM 8.9 8.9 8.8 8.7 9.6  MG -- -- -- -- --  PHOS -- -- -- -- --   Liver Function Tests:  Lab 09/23/11 2145  AST 30  ALT 12  ALKPHOS 72  BILITOT 0.3  PROT 7.7  ALBUMIN 3.2*    Lab 09/23/11 2144  LIPASE 23  AMYLASE --   No results found for this basename: AMMONIA:5 in the last 168 hours CBC:  Lab 09/27/11 0625 09/26/11 0445 09/25/11 0650 09/24/11 0545 09/23/11 2145  WBC 11.3* 11.0* 9.5 15.1* 20.1*  NEUTROABS -- -- -- -- 17.2*  HGB 9.6* 10.3* 10.5* 10.9* 12.7  HCT 28.5* 30.1* 31.0* 32.1* 37.1  MCV 90.5 90.9 92.3 92.0 91.6  PLT 311 293 266 238 282   Cardiac Enzymes: No results found for this basename: CKTOTAL:5,CKMB:5,CKMBINDEX:5,TROPONINI:5 in the last 168 hours BNP: BNP (last 3 results) No results found for this basename: PROBNP:3 in the last 8760 hours CBG:  Lab 09/27/11 0631 09/26/11 2056 09/26/11 1623 09/26/11 1139 09/26/11 0730  GLUCAP 221* 258* 92 165* 273*    Time coordinating discharge: 45 minutes  Signed:  Wellington Winegarden  [facility] 09/27/2011, 10:44 AM

## 2011-09-26 ENCOUNTER — Inpatient Hospital Stay (HOSPITAL_COMMUNITY): Payer: BC Managed Care – PPO

## 2011-09-26 DIAGNOSIS — L03317 Cellulitis of buttock: Secondary | ICD-10-CM

## 2011-09-26 DIAGNOSIS — I498 Other specified cardiac arrhythmias: Secondary | ICD-10-CM

## 2011-09-26 DIAGNOSIS — D72829 Elevated white blood cell count, unspecified: Secondary | ICD-10-CM

## 2011-09-26 DIAGNOSIS — L0231 Cutaneous abscess of buttock: Secondary | ICD-10-CM

## 2011-09-26 DIAGNOSIS — R509 Fever, unspecified: Secondary | ICD-10-CM

## 2011-09-26 LAB — GLUCOSE, CAPILLARY
Glucose-Capillary: 240 mg/dL — ABNORMAL HIGH (ref 70–99)
Glucose-Capillary: 273 mg/dL — ABNORMAL HIGH (ref 70–99)
Glucose-Capillary: 165 mg/dL — ABNORMAL HIGH (ref 70–99)
Glucose-Capillary: 92 mg/dL (ref 70–99)
Glucose-Capillary: 258 mg/dL — ABNORMAL HIGH (ref 70–99)

## 2011-09-26 LAB — BASIC METABOLIC PANEL
BUN: 8 mg/dL (ref 6–23)
CO2: 25 mEq/L (ref 19–32)
Calcium: 8.9 mg/dL (ref 8.4–10.5)
Chloride: 102 mEq/L (ref 96–112)
Creatinine, Ser: 0.51 mg/dL (ref 0.50–1.10)
GFR calc Af Amer: >90 mL/min (ref >90)
GFR calc non Af Amer: >90 mL/min (ref >90)
Glucose, Bld: 240 mg/dL — ABNORMAL HIGH (ref 70–99)
Potassium: 3.7 mEq/L (ref 3.5–5.1)
Sodium: 137 mEq/L (ref 135–145)

## 2011-09-26 LAB — CBC
HCT: 30.1 % — ABNORMAL LOW (ref 36.0–46.0)
Hemoglobin: 10.3 g/dL — ABNORMAL LOW (ref 12.0–15.0)
MCH: 31.1 pg (ref 26.0–34.0)
MCHC: 34.2 g/dL (ref 30.0–36.0)
MCV: 90.9 fL (ref 78.0–100.0)
Platelets: 293 10*3/uL (ref 150–400)
RBC: 3.31 MIL/uL — ABNORMAL LOW (ref 3.87–5.11)
RDW: 13.1 % (ref 11.5–15.5)
WBC: 11 10*3/uL — ABNORMAL HIGH (ref 4.0–10.5)

## 2011-09-26 MED ORDER — PIPERACILLIN-TAZOBACTAM 3.375 G IVPB
3.3750 g | Freq: Three times a day (TID) | INTRAVENOUS | Status: DC
  Administered 2011-09-26 – 2011-09-27 (×4): 3.375 g via INTRAVENOUS
  Filled 2011-09-26 (×6): qty 50

## 2011-09-26 MED ORDER — INSULIN GLARGINE 100 UNIT/ML ~~LOC~~ SOLN
30.0000 [IU] | Freq: Every day | SUBCUTANEOUS | Status: DC
  Administered 2011-09-26 – 2011-09-27 (×2): 30 [IU] via SUBCUTANEOUS

## 2011-09-26 MED ORDER — IOHEXOL 300 MG/ML  SOLN
80.0000 mL | Freq: Once | INTRAMUSCULAR | Status: AC | PRN
  Administered 2011-09-26: 80 mL via INTRAVENOUS

## 2011-09-26 MED ORDER — VANCOMYCIN HCL IN DEXTROSE 1-5 GM/200ML-% IV SOLN
1000.0000 mg | Freq: Two times a day (BID) | INTRAVENOUS | Status: DC
  Administered 2011-09-26 – 2011-09-27 (×3): 1000 mg via INTRAVENOUS
  Filled 2011-09-26 (×4): qty 200

## 2011-09-26 NOTE — Progress Notes (Signed)
ANTIBIOTIC CONSULT NOTE - INITIAL  Pharmacy Consult for vancomycin, zosyn Indication: pneumonia  No Known Allergies  Patient Measurements: Height: 5' (152.4 cm) Weight: 160 lb 4.4 oz (72.7 kg) IBW/kg (Calculated) : 45.5    Vital Signs: Temp: 98.7 F (37.1 C) (09/15 0823) Temp src: Oral (09/15 0457) BP: 131/52 mmHg (09/15 0457) Pulse Rate: 99  (09/15 0457) Intake/Output from previous day: 09/14 0701 - 09/15 0700 In: 2200 [P.O.:300; I.V.:1600; IV Piggyback:300] Out: 600 [Urine:600] Intake/Output from this shift:    Labs:  Basename 09/26/11 0445 09/25/11 0650 09/24/11 0545  WBC 11.0* 9.5 15.1*  HGB 10.3* 10.5* 10.9*  PLT 293 266 238  LABCREA -- -- --  CREATININE 0.51 0.59 0.71   Estimated Creatinine Clearance: 63.3 ml/min (by C-G formula based on Cr of 0.51). No results found for this basename: VANCOTROUGH:2,VANCOPEAK:2,VANCORANDOM:2,GENTTROUGH:2,GENTPEAK:2,GENTRANDOM:2,TOBRATROUGH:2,TOBRAPEAK:2,TOBRARND:2,AMIKACINPEAK:2,AMIKACINTROU:2,AMIKACIN:2, in the last 72 hours   Microbiology: Recent Results (from the past 720 hour(s))  URINE CULTURE     Status: Normal   Collection Time   09/22/11  7:10 PM      Component Value Range Status Comment   Specimen Description URINE, CLEAN CATCH   Final    Special Requests NONE   Final    Culture  Setup Time 09/22/2011 20:10   Final    Colony Count NO GROWTH   Final    Culture NO GROWTH   Final    Report Status 09/23/2011 FINAL   Final   CULTURE, BLOOD (ROUTINE X 2)     Status: Normal (Preliminary result)   Collection Time   09/23/11  9:24 PM      Component Value Range Status Comment   Specimen Description BLOOD LEFT ARM   Final    Special Requests BOTTLES DRAWN AEROBIC AND ANAEROBIC 10CC   Final    Culture  Setup Time 09/24/2011 04:30   Final    Culture     Final    Value:        BLOOD CULTURE RECEIVED NO GROWTH TO DATE CULTURE WILL BE HELD FOR 5 DAYS BEFORE ISSUING A FINAL NEGATIVE REPORT   Report Status PENDING   Incomplete     URINE CULTURE     Status: Normal   Collection Time   09/23/11  9:30 PM      Component Value Range Status Comment   Specimen Description URINE, CLEAN CATCH   Final    Special Requests NONE   Final    Culture  Setup Time 09/24/2011 00:41   Final    Colony Count NO GROWTH   Final    Culture NO GROWTH   Final    Report Status 09/25/2011 FINAL   Final   CULTURE, BLOOD (ROUTINE X 2)     Status: Normal (Preliminary result)   Collection Time   09/23/11  9:54 PM      Component Value Range Status Comment   Specimen Description BLOOD RIGHT HAND   Final    Special Requests BOTTLES DRAWN AEROBIC ONLY   Final    Culture  Setup Time 09/24/2011 04:31   Final    Culture     Final    Value:        BLOOD CULTURE RECEIVED NO GROWTH TO DATE CULTURE WILL BE HELD FOR 5 DAYS BEFORE ISSUING A FINAL NEGATIVE REPORT   Report Status PENDING   Incomplete     Medical History: Past Medical History  Diagnosis Date  . Blood transfusion   . Heart murmur   .  Hyperlipidemia   . Diabetes mellitus     takes Diovan for her kidneys  . Boils   . Abscess   . HTN (hypertension)   . Hx of adenomatous colonic polyps   . Glaucoma   . Osteoporosis     Medications:  Prescriptions prior to admission  Medication Sig Dispense Refill  . aspirin EC 81 MG tablet Take 81 mg by mouth daily.      Marland Kitchen atorvastatin (LIPITOR) 40 MG tablet Take 40 mg by mouth at bedtime and may repeat dose one time if needed.      . fluticasone (FLONASE) 50 MCG/ACT nasal spray Place 2 sprays into the nose daily as needed for rhinitis.  16 g  1  . insulin regular (HUMULIN R,NOVOLIN R) 100 units/mL injection Inject 20 Units into the skin 3 (three) times daily before meals.        . metFORMIN (GLUCOPHAGE) 1000 MG tablet 1 tablet 2 (two) times daily.      . ondansetron (ZOFRAN) 4 MG tablet Take 4 mg by mouth every 6 (six) hours. Scheduled as every 6 hours on prescription bottle      . simvastatin (ZOCOR) 40 MG tablet Take 1 tablet by mouth Daily.        . valsartan-hydrochlorothiazide (DIOVAN-HCT) 320-25 MG per tablet Take 1 tablet by mouth daily.        . Vitamin D, Ergocalciferol, (DRISDOL) 50000 UNITS CAPS Take 1 capsule by mouth 2 (two) times a week.       Marland Kitchen DISCONTD: levofloxacin (LEVAQUIN) 250 MG tablet Take 250 mg by mouth daily. Started 9/11. Should finish on 9/16       Assessment: 66 yo lady admitted with CAP.  She was treated with rocephin and azithromycin for ~48 hours but remains febrile and tachycardic.  She will start vancomycin and zosyn today.  BC and UC are no growth to date.  Goal of Therapy:  Vancomycin trough level 15-20 mcg/ml  Plan:  Zosyn 3.375 gm IV q8 hours. Vancomycin 1 gm IV q12 hours. F/u clinical course, renal function and cultures. Check vanc trough when appropriate.    Annalynn Centanni Poteet 09/26/2011,8:59 AM

## 2011-09-26 NOTE — Progress Notes (Signed)
[facility] PROGRESS NOTE  Sarah [name] WUJ:[id number] DOB: 1945-02-02 DOA: 09/23/2011 PCP: Sarah Baars, MD  Assessment/Plan: Active Problems:  Community acquired pneumonia  Diabetes mellitus type 2 in nonobese  Hyperlipidemia  Hypertension  Leukocytosis  Sinus tachycardia  Fever  Community Acquired Pneumonia:   + SIRS criteria and persistently febrile and tachycardic, and WBC trending up slightly today.  Patient at high risk for complication and readmission due to persistent fever > 38.1 and HR > 100.   -  Recultured -  Escalated to vancomycin and zosyn this morning and continued azithromycin -  CXR shows interstitial infiltrates -  Chest CT concerning for right lower lobe infiltrate but with pulmonary nodules that may suggest fungal infection per radiology.  Spoke with Infectious Disease Dr. Orvan [name] who reviewed case and CT scan and recommended continuing vanc and zosyn x 24 hours, monitoring patient clinically, and pealing back antibiotics tomorrow if patient improved.  Most likely community acquired pneumonia with delayed response to therapy.    T2DM:  Fingerstick again > 200 this morning.  Currently FS is 92.  -  Holding metformin -  Continue meal time insulin -  Continue lantus 20 units with SSI and titrate as needed  Hypertension:  Well controlled but rises with fevers. -  Continue HCTZ and irbesartan 300 -  Cont aspirin  HLD:  Stable.  Continue statin  DIET:  Healthy heart IVF:  NS at 100/h x 10 h ACCESS:  PIV PROPH:  Lovenox   Code Status: Full code Family Communication:  none Disposition Plan:  Patient meets SIRS criteria and continues to appear clinically unwell.  High risk of complication and readmission as T>38.1 and HR > 100.  Reassess after 24 hours of escalated therapy.    Brief narrative:  This is a 66 year old female who states she's had abdominal discomfort for approximately 4 days. She's been hurting the bilateral right and left lower  quadrants. She has had fevers up to 103, she's been dizzy and tired. She went to urgent care yesterday where she was prescribed Levaquin and discharged home. Today she continued to feel badly with temperatures up to 103, she came back to the ER. She denies any shortness of breath, any cough, any wheezing, no chest pains. She denies any nausea, vomiting or diarrhea. She denies burning on urination or any confusion. She does states she's been weak. History provided by the patient   Consultants:  None  Procedures:  CXR 9/12  CT abd 9/13  Antibiotics:  Levaquin 9/12>>9/13  Ceftriaxone 9/13 >> 9/15  Azithromycin 9/13 >>  Vanco 9/15 >>  Zosyn 9/15 >>  HPI/Subjective:  The patient states she continues to feel unwell today.  She is coughing and feels febrile and achy.     Objective: Filed Vitals:   09/25/11 2230 09/26/11 0457 09/26/11 0823 09/26/11 1415  BP:  131/52  127/65  Pulse: 102 99  99  Temp: 99.2 F (37.3 C) 100.1 F (37.8 C) 98.7 F (37.1 C) 99.2 F (37.3 C)  TempSrc: Oral Oral  Oral  Resp:  19  18  Height:      Weight:      SpO2:  92%  95%    Intake/Output Summary (Last 24 hours) at 09/26/11 1651 Last data filed at 09/26/11 1300  Gross per 24 hour  Intake   1640 ml  Output    600 ml  Net   1040 ml   Filed Weights   09/24/11 0415  Weight: 72.7 kg (160  lb 4.4 oz)    Exam:   General:  Overweight AAF, no acute distress.    Cardiovascular: Tachycardic, regular rhythm, 1/6 vibratory murmur across precordium  Respiratory:  dense bronchial BS at the right base to the mid-back.  No wheezes or rhonchi  Abdomen: NABS, soft, nondistended, nontender  Ext:  No LEE  Psych:  A&Ox4  Data Reviewed: Basic Metabolic Panel:  Lab 09/26/11 9604 09/25/11 0650 09/24/11 0545 09/23/11 2145 09/22/11 1600  NA 137 136 137 135 136  K 3.7 3.6 3.7 3.8 4.5  CL 102 99 104 99 101  CO2 25 25 23 21  --  GLUCOSE 240* 262* 77 112* 297*  BUN 8 13 11 12 7   CREATININE 0.51  0.59 0.71 0.69 0.80  CALCIUM 8.9 8.8 8.7 9.6 --  MG -- -- -- -- --  PHOS -- -- -- -- --   Liver Function Tests:  Lab 09/23/11 2145  AST 30  ALT 12  ALKPHOS 72  BILITOT 0.3  PROT 7.7  ALBUMIN 3.2*    Lab 09/23/11 2144  LIPASE 23  AMYLASE --   No results found for this basename: AMMONIA:5 in the last 168 hours CBC:  Lab 09/26/11 0445 09/25/11 0650 09/24/11 0545 09/23/11 2145 09/22/11 1600  WBC 11.0* 9.5 15.1* 20.1* --  NEUTROABS -- -- -- 17.2* --  HGB 10.3* 10.5* 10.9* 12.7 14.3  HCT 30.1* 31.0* 32.1* 37.1 42.0  MCV 90.9 92.3 92.0 91.6 --  PLT 293 266 238 282 --   Cardiac Enzymes: No results found for this basename: CKTOTAL:5,CKMB:5,CKMBINDEX:5,TROPONINI:5 in the last 168 hours BNP (last 3 results) No results found for this basename: PROBNP:3 in the last 8760 hours CBG:  Lab 09/26/11 1623 09/26/11 1139 09/26/11 0730 09/26/11 0624 09/25/11 2049  GLUCAP 92 165* 273* 240* 180*    Recent Results (from the past 240 hour(s))  URINE CULTURE     Status: Normal   Collection Time   09/22/11  7:10 PM      Component Value Range Status Comment   Specimen Description URINE, CLEAN CATCH   Final    Special Requests NONE   Final    Culture  Setup Time 09/22/2011 20:10   Final    Colony Count NO GROWTH   Final    Culture NO GROWTH   Final    Report Status 09/23/2011 FINAL   Final   CULTURE, BLOOD (ROUTINE X 2)     Status: Normal (Preliminary result)   Collection Time   09/23/11  9:24 PM      Component Value Range Status Comment   Specimen Description BLOOD LEFT ARM   Final    Special Requests BOTTLES DRAWN AEROBIC AND ANAEROBIC 10CC   Final    Culture  Setup Time 09/24/2011 04:30   Final    Culture     Final    Value:        BLOOD CULTURE RECEIVED NO GROWTH TO DATE CULTURE WILL BE HELD FOR 5 DAYS BEFORE ISSUING A FINAL NEGATIVE REPORT   Report Status PENDING   Incomplete   URINE CULTURE     Status: Normal   Collection Time   09/23/11  9:30 PM      Component Value Range  Status Comment   Specimen Description URINE, CLEAN CATCH   Final    Special Requests NONE   Final    Culture  Setup Time 09/24/2011 00:41   Final    Colony Count NO GROWTH   Final  Culture NO GROWTH   Final    Report Status 09/25/2011 FINAL   Final   CULTURE, BLOOD (ROUTINE X 2)     Status: Normal (Preliminary result)   Collection Time   09/23/11  9:54 PM      Component Value Range Status Comment   Specimen Description BLOOD RIGHT HAND   Final    Special Requests BOTTLES DRAWN AEROBIC ONLY   Final    Culture  Setup Time 09/24/2011 04:31   Final    Culture     Final    Value:        BLOOD CULTURE RECEIVED NO GROWTH TO DATE CULTURE WILL BE HELD FOR 5 DAYS BEFORE ISSUING A FINAL NEGATIVE REPORT   Report Status PENDING   Incomplete      Studies: Dg Chest 2 View  09/26/2011  *RADIOLOGY REPORT*  Clinical Data: Fever.  Tachycardia.  Jahmeek Shirk of breath.  CHEST - 2 VIEW  Comparison: 09/22/2011  Findings: Heart size is normal.  Mediastinal shadows are normal. There is worsened diffuse bronchial thickening and interstitial pulmonary density.  Findings worrisome for bronchitis and diffuse pneumonitis.  No dense consolidation or collapse.  One could also consider coexistent interstitial edema.  No bony abnormality.  IMPRESSION: Radiographic worsening of diffuse bronchial thickening and interstitial markings suggesting bronchitis and interstitial pneumonia.  Interstitial edema is also possible.   Original Report Authenticated By: Thomasenia Sales, M.D.    Ct Chest W Contrast  09/26/2011  *RADIOLOGY REPORT*  Clinical Data: Persistent fever despite treatment.  CT CHEST WITH CONTRAST  Technique:  Multidetector CT imaging of the chest was performed following the standard protocol during bolus administration of intravenous contrast.  Contrast: 80mL OMNIPAQUE IOHEXOL 300 MG/ML  SOLN  Comparison: 09/26/2011 radiographs  Findings: Right upper paratracheal node Jaryn Rosko axis 1.2 cm, image 10 of series 2.  Subcarinal  node Carver Murakami axis 1.5 cm, image 21 of series 2.  Small pericardial effusion noted along with a trace right pleural effusion; the trace right pleural effusion appears to preferentially collect along the right posterior hemidiaphragm and posteromedial right hemithorax.  The trace right pleural effusion is adjacent to the region of the airspace opacity and air bronchograms involving the medial basal segment and posterior basal segment right lower lobe.  There is also ill-defined randomly distributed nodularity scattered in the remainder of the lungs.  Fullness of the left adrenal gland noted without overt mass.  IMPRESSION:  1.  Confluent airspace opacity medially in the right lower lobe with air bronchograms; but there is also scattered indistinct nodularity in both lungs and mild paratracheal and subcarinal adenopathy.  Trace right pleural effusion may be slightly loculated along the pneumonia.  The right lower lobe process favors a bacterial pneumonia although the scattered nodularity in both lungs raises suspicion for the possibility of atypical pneumonia such as fungal disease. Hematogenous leak disseminated metastatic disease is unlikely given the indistinct margination of the nodularity, but follow-up to clearance is recommended. 2.  Trace pericardial effusion.   Original Report Authenticated By: Dellia Cloud, M.D.     Scheduled Meds:    . aspirin EC  81 mg Oral Daily  . atorvastatin  40 mg Oral q1800  . azithromycin  500 mg Intravenous Q24H  . enoxaparin (LOVENOX) injection  40 mg Subcutaneous Q24H  . hydrochlorothiazide  25 mg Oral Daily  . insulin aspart  0-5 Units Subcutaneous QHS  . insulin aspart  0-9 Units Subcutaneous TID WC  . insulin  aspart  15 Units Subcutaneous TID WC  . insulin glargine  30 Units Subcutaneous Daily  . irbesartan  300 mg Oral Daily  . piperacillin-tazobactam (ZOSYN)  IV  3.375 g Intravenous Q8H  . sodium chloride  3 mL Intravenous Q12H  . sodium chloride  3 mL  Intravenous Q12H  . vancomycin  1,000 mg Intravenous Q12H  . DISCONTD: cefTRIAXone (ROCEPHIN)  IV  1 g Intravenous Q24H   Continuous Infusions:    . sodium chloride 100 mL/hr at 09/26/11 9562    Active Problems:  Community acquired pneumonia  Diabetes mellitus type 2 in nonobese  Hyperlipidemia  Hypertension  Leukocytosis  Sinus tachycardia  Fever    Time spent: 30 minutes    Abiola Behring, [facility]  [facility] Pager [phone]. If 8PM-8AM, please contact night-coverage at [email or web address], password [facility] 09/26/2011, 4:51 PM  LOS: 3 days

## 2011-09-27 DIAGNOSIS — D649 Anemia, unspecified: Secondary | ICD-10-CM

## 2011-09-27 LAB — GLUCOSE, CAPILLARY
Glucose-Capillary: 221 mg/dL — ABNORMAL HIGH (ref 70–99)
Glucose-Capillary: 216 mg/dL — ABNORMAL HIGH (ref 70–99)

## 2011-09-27 LAB — CBC
HCT: 28.5 % — ABNORMAL LOW (ref 36.0–46.0)
Hemoglobin: 9.6 g/dL — ABNORMAL LOW (ref 12.0–15.0)
MCH: 30.5 pg (ref 26.0–34.0)
MCHC: 33.7 g/dL (ref 30.0–36.0)
MCV: 90.5 fL (ref 78.0–100.0)
Platelets: 311 10*3/uL (ref 150–400)
RBC: 3.15 MIL/uL — ABNORMAL LOW (ref 3.87–5.11)
RDW: 13 % (ref 11.5–15.5)
WBC: 11.3 10*3/uL — ABNORMAL HIGH (ref 4.0–10.5)

## 2011-09-27 LAB — BASIC METABOLIC PANEL
BUN: 5 mg/dL — ABNORMAL LOW (ref 6–23)
CO2: 26 mEq/L (ref 19–32)
Calcium: 8.9 mg/dL (ref 8.4–10.5)
Chloride: 101 mEq/L (ref 96–112)
Creatinine, Ser: 0.54 mg/dL (ref 0.50–1.10)
GFR calc Af Amer: >90 mL/min (ref >90)
GFR calc non Af Amer: >90 mL/min (ref >90)
Glucose, Bld: 233 mg/dL — ABNORMAL HIGH (ref 70–99)
Potassium: 3.5 mEq/L (ref 3.5–5.1)
Sodium: 137 mEq/L (ref 135–145)

## 2011-09-27 LAB — LEGIONELLA ANTIGEN, URINE: Legionella Antigen, Urine: NEGATIVE

## 2011-09-27 MED ORDER — INSULIN GLARGINE 100 UNIT/ML ~~LOC~~ SOLN: 30.0000 [IU] | Freq: Every day | SUBCUTANEOUS | Status: DC

## 2011-09-27 MED ORDER — DM-GUAIFENESIN ER 60-1200 MG PO TB12: 1.0000 | ORAL_TABLET | Freq: Two times a day (BID) | ORAL | Status: DC

## 2011-09-27 NOTE — Progress Notes (Signed)
Inpatient Diabetes Program Recommendations  AACE/ADA: New Consensus Statement on Inpatient Glycemic Control (2013)  Target Ranges:  Prepandial:   less than 140 mg/dL      Peak postprandial:   less than 180 mg/dL (1-2 hours)      Critically ill patients:  140 - 180 mg/dL   Reason for Visit: Results for Sarah [name], Sarah [name] (MRN [id number]) as of 09/27/2011 15:37  Ref. Range 09/26/2011 16:23 09/26/2011 20:56 09/27/2011 06:25 09/27/2011 06:31 09/27/2011 11:13  Glucose-Capillary Latest Range: 70-99 mg/dL 92 782 (H)  956 (H) 213 (H)   Note history of diabetes.  CBG's elevated during hospitalization. Please check A1C to determine pre-hospitalization glycemic control.  Consider increasing Lantus to 38 units daily. Will follow.

## 2011-09-27 NOTE — Care Management Note (Signed)
    Page 1 of 1   09/27/2011     1:33:14 PM   CARE MANAGEMENT NOTE 09/27/2011  Patient:  Sarah [name], Sarah [name]   Account Number:  [id number]  Date Initiated:  09/27/2011  Documentation initiated by:  Seab Axel  Subjective/Objective Assessment:   PT ADM WITH COMMUNITY ACQUIRED PNA ON 09/23/11.  PTA, PT INDEPENDENT, LIVES WITH MOTHER.     Action/Plan:   MET WITH PT TO DISCUSS DC PLANS.  MOM TO PROVIDE CARE AT DISCHARGE.  PT DENIES ANY HOME NEEDS.  POSSIBLE DC LATER TODAY.   Anticipated DC Date:  09/27/2011   Anticipated DC Plan:  HOME/SELF CARE      DC Planning Services  CM consult      Choice offered to / List presented to:             Status of service:  Completed, signed off Medicare Important Message given?   (If response is "NO", the following Medicare IM given date fields will be blank) Date Medicare IM given:   Date Additional Medicare IM given:    Discharge Disposition:  HOME/SELF CARE  Per UR Regulation:  Reviewed for med. necessity/level of care/duration of stay  If discussed at Long Length of Stay Meetings, dates discussed:    Comments:

## 2011-09-30 LAB — CULTURE, BLOOD (ROUTINE X 2)
Culture: NO GROWTH
Culture: NO GROWTH

## 2011-10-02 LAB — CULTURE, BLOOD (ROUTINE X 2)
Culture: NO GROWTH
Culture: NO GROWTH

## 2012-03-21 ENCOUNTER — Emergency Department (HOSPITAL_COMMUNITY): Payer: BC Managed Care – PPO

## 2012-03-21 ENCOUNTER — Encounter (HOSPITAL_COMMUNITY): Payer: Self-pay | Admitting: Nurse Practitioner

## 2012-03-21 ENCOUNTER — Emergency Department (HOSPITAL_COMMUNITY)
Admission: EM | Admit: 2012-03-21 | Discharge: 2012-03-21 | Disposition: A | Discharge status: Home / Self Care | Payer: BC Managed Care – PPO | Attending: Emergency Medicine | Admitting: Emergency Medicine

## 2012-03-21 DIAGNOSIS — Y9301 Activity, walking, marching and hiking: Secondary | ICD-10-CM | POA: Insufficient documentation

## 2012-03-21 DIAGNOSIS — R011 Cardiac murmur, unspecified: Secondary | ICD-10-CM | POA: Insufficient documentation

## 2012-03-21 DIAGNOSIS — Z8739 Personal history of other diseases of the musculoskeletal system and connective tissue: Secondary | ICD-10-CM | POA: Insufficient documentation

## 2012-03-21 DIAGNOSIS — Z79899 Other long term (current) drug therapy: Secondary | ICD-10-CM | POA: Insufficient documentation

## 2012-03-21 DIAGNOSIS — Z794 Long term (current) use of insulin: Secondary | ICD-10-CM | POA: Insufficient documentation

## 2012-03-21 DIAGNOSIS — Z8669 Personal history of other diseases of the nervous system and sense organs: Secondary | ICD-10-CM | POA: Insufficient documentation

## 2012-03-21 DIAGNOSIS — Z872 Personal history of diseases of the skin and subcutaneous tissue: Secondary | ICD-10-CM | POA: Insufficient documentation

## 2012-03-21 DIAGNOSIS — E785 Hyperlipidemia, unspecified: Secondary | ICD-10-CM | POA: Insufficient documentation

## 2012-03-21 DIAGNOSIS — E119 Type 2 diabetes mellitus without complications: Secondary | ICD-10-CM | POA: Insufficient documentation

## 2012-03-21 DIAGNOSIS — Z860100 Personal history of colon polyps, unspecified: Secondary | ICD-10-CM | POA: Insufficient documentation

## 2012-03-21 DIAGNOSIS — F29 Unspecified psychosis not due to a substance or known physiological condition: Secondary | ICD-10-CM | POA: Insufficient documentation

## 2012-03-21 DIAGNOSIS — I10 Essential (primary) hypertension: Secondary | ICD-10-CM | POA: Insufficient documentation

## 2012-03-21 DIAGNOSIS — Y92009 Unspecified place in unspecified non-institutional (private) residence as the place of occurrence of the external cause: Secondary | ICD-10-CM | POA: Insufficient documentation

## 2012-03-21 DIAGNOSIS — W010XXA Fall on same level from slipping, tripping and stumbling without subsequent striking against object, initial encounter: Secondary | ICD-10-CM | POA: Insufficient documentation

## 2012-03-21 DIAGNOSIS — Z7982 Long term (current) use of aspirin: Secondary | ICD-10-CM | POA: Insufficient documentation

## 2012-03-21 DIAGNOSIS — S8253XA Displaced fracture of medial malleolus of unspecified tibia, initial encounter for closed fracture: Secondary | ICD-10-CM | POA: Insufficient documentation

## 2012-03-21 DIAGNOSIS — Z8701 Personal history of pneumonia (recurrent): Secondary | ICD-10-CM | POA: Insufficient documentation

## 2012-03-21 DIAGNOSIS — S82891A Other fracture of right lower leg, initial encounter for closed fracture: Secondary | ICD-10-CM

## 2012-03-21 DIAGNOSIS — F17200 Nicotine dependence, unspecified, uncomplicated: Secondary | ICD-10-CM | POA: Insufficient documentation

## 2012-03-21 DIAGNOSIS — IMO0002 Reserved for concepts with insufficient information to code with codable children: Secondary | ICD-10-CM | POA: Insufficient documentation

## 2012-03-21 HISTORY — DX: Pneumonia, unspecified organism: J18.9

## 2012-03-21 LAB — GLUCOSE, CAPILLARY
Glucose-Capillary: 25 mg/dL — CL (ref 70–99)
Glucose-Capillary: 113 mg/dL — ABNORMAL HIGH (ref 70–99)

## 2012-03-21 LAB — CBC WITH DIFFERENTIAL/PLATELET
Basophils Absolute: 0 10*3/uL (ref 0.0–0.1)
Basophils Relative: 0 % (ref 0–1)
Eosinophils Absolute: 0 10*3/uL (ref 0.0–0.7)
Eosinophils Relative: 0 % (ref 0–5)
HCT: 38.7 % (ref 36.0–46.0)
Hemoglobin: 13 g/dL (ref 12.0–15.0)
Lymphocytes Relative: 21 % (ref 12–46)
Lymphs Abs: 2.8 10*3/uL (ref 0.7–4.0)
MCH: 30.7 pg (ref 26.0–34.0)
MCHC: 33.6 g/dL (ref 30.0–36.0)
MCV: 91.3 fL (ref 78.0–100.0)
Monocytes Absolute: 0.8 10*3/uL (ref 0.1–1.0)
Monocytes Relative: 6 % (ref 3–12)
Neutro Abs: 9.7 10*3/uL — ABNORMAL HIGH (ref 1.7–7.7)
Neutrophils Relative %: 73 % (ref 43–77)
Platelets: 307 10*3/uL (ref 150–400)
RBC: 4.24 MIL/uL (ref 3.87–5.11)
RDW: 13.5 % (ref 11.5–15.5)
WBC: 13.2 10*3/uL — ABNORMAL HIGH (ref 4.0–10.5)

## 2012-03-21 LAB — BASIC METABOLIC PANEL
BUN: 15 mg/dL (ref 6–23)
CO2: 24 mEq/L (ref 19–32)
Calcium: 10.5 mg/dL (ref 8.4–10.5)
Chloride: 105 mEq/L (ref 96–112)
Creatinine, Ser: 0.58 mg/dL (ref 0.50–1.10)
GFR calc Af Amer: >90 mL/min (ref >90)
GFR calc non Af Amer: >90 mL/min (ref >90)
Glucose, Bld: 80 mg/dL (ref 70–99)
Potassium: 4.6 mEq/L (ref 3.5–5.1)
Sodium: 140 mEq/L (ref 135–145)

## 2012-03-21 MED ORDER — HYDROCODONE-ACETAMINOPHEN 5-325 MG PO TABS: 1.0000 | ORAL_TABLET | Freq: Four times a day (QID) | ORAL | Status: DC | PRN

## 2012-03-21 MED ORDER — ONDANSETRON HCL 4 MG/2ML IJ SOLN
4.0000 mg | Freq: Once | INTRAMUSCULAR | Status: AC
  Administered 2012-03-21: 4 mg via INTRAVENOUS
  Filled 2012-03-21: qty 2

## 2012-03-21 MED ORDER — DEXTROSE 50 % IV SOLN
25.0000 g | Freq: Once | INTRAVENOUS | Status: AC
  Administered 2012-03-21: 25 g via INTRAVENOUS
  Filled 2012-03-21: qty 50

## 2012-03-21 MED ORDER — SODIUM CHLORIDE 0.9 % IV SOLN
Freq: Once | INTRAVENOUS | Status: AC
  Administered 2012-03-21: 17:00:00 via INTRAVENOUS

## 2012-03-21 MED ORDER — MORPHINE SULFATE 4 MG/ML IJ SOLN
4.0000 mg | Freq: Once | INTRAMUSCULAR | Status: AC
  Administered 2012-03-21: 4 mg via INTRAVENOUS
  Filled 2012-03-21: qty 1

## 2012-03-21 NOTE — Progress Notes (Signed)
Orthopedic Tech Progress Note Patient Details:  Sarah [name] [birth year] [id number]  Ortho Devices Type of Ortho Device: Ace wrap;Stirrup splint;Crutches Ortho Device/Splint Location: right leg Ortho Device/Splint Interventions: Application   Nikki Dom 03/21/2012, 6:49 PM

## 2012-03-21 NOTE — ED Notes (Signed)
Ortho paged. 

## 2012-03-21 NOTE — ED Notes (Signed)
Pt noted to be confused and lethargic upon NP assessment. Pts CBG checked and 25 mg/dl noted. Felicie Morn, NP aware. IV immediately started and D50 given. Pt now A&O x4 and answering questions appropriately. CBG will be reassessed in a few minutes.

## 2012-03-21 NOTE — ED Notes (Signed)
Patient transported to X-ray 

## 2012-03-21 NOTE — ED Notes (Signed)
States she was walking down stairs at home and tripped injuring her ankles. States pain in R ankle is severe and L ankle is moderate. Pt was able to ambulate with crutches afterwards.

## 2012-03-21 NOTE — ED Provider Notes (Signed)
History  This chart was scribed for non-physician practitioner working with Sarah Razor, MD by Sarah [name], ED Scribe. This patient was seen in room TR09C/TR09C and the patient's care was started at 1641.  CSN: [id number]  Arrival date & time 03/21/12  1524   First MD Initiated Contact with Patient 03/21/12 1641      Chief Complaint  Patient presents with  . Ankle Injury     Patient is a 67 y.o. female presenting with lower extremity injury. The history is provided by the patient. No language interpreter was used.  Ankle Injury This is a new problem. The current episode started 1 to 2 hours ago. The problem occurs constantly. The problem has been gradually worsening. Pertinent negatives include no chest pain, no abdominal pain, no headaches and no shortness of breath. The symptoms are aggravated by walking, twisting and bending. Nothing relieves the symptoms.    Sarah [name] is a 67 y.o. female with a h/o DM who presents to the Emergency Department complaining of gradually worsening bilateral ankle pain that began suddenly after a fall that occurred a few hours ago. Pt seems very confused during exam. She is unable to answer questions. She states she has been "having some problems." She keeps repeating she "just had a problem." Pt is unaware of time. She remembers falling. She doesn't think she hit her head when she fell.    Past Medical History  Diagnosis Date  . Heart murmur   . Hyperlipidemia   . Diabetes mellitus     takes Diovan for her kidneys  . Boils   . Abscess   . HTN (hypertension)   . Hx of adenomatous colonic polyps   . Glaucoma(365)   . Osteoporosis   . Pneumonia     Past Surgical History  Procedure Laterality Date  . Colonoscopy    . Polypectomy    . Abdominal hysterectomy    . Tonsillectomy    . Carpal tunnel release      right  . Knee arthroscopy      right    Family History  Problem Relation Age of Onset  . Colon cancer Neg Hx   .  Esophageal cancer Neg Hx   . Rectal cancer Neg Hx   . Stomach cancer Neg Hx   . Diabetes Mother   . Hypertension Mother     History  Substance Use Topics  . Smoking status: Current Some Day Smoker  . Smokeless tobacco: Never Used     Comment: smokes occ.   Marland Kitchen Alcohol Use: Yes   No OB history available.   Review of Systems  Constitutional: Negative for fever and chills.  Respiratory: Negative for shortness of breath.   Cardiovascular: Negative for chest pain.  Gastrointestinal: Negative for nausea, vomiting and abdominal pain.  Musculoskeletal: Positive for arthralgias.       Bilateral ankle pain   Neurological: Negative for weakness and headaches.  All other systems reviewed and are negative.    Allergies  Review of patient's allergies indicates no known allergies.  Home Medications   Current Outpatient Rx  Name  Route  Sig  Dispense  Refill  . aspirin EC 81 MG tablet   Oral   Take 81 mg by mouth daily.         Marland Kitchen atorvastatin (LIPITOR) 40 MG tablet   Oral   Take 40 mg by mouth at bedtime and may repeat dose one time if needed.         Marland Kitchen  fluticasone (FLONASE) 50 MCG/ACT nasal spray   Nasal   Place 2 sprays into the nose daily as needed for rhinitis.   16 g   1   . insulin regular (HUMULIN R,NOVOLIN R) 100 units/mL injection   Subcutaneous   Inject 20 Units into the skin 3 (three) times daily before meals.           . ondansetron (ZOFRAN) 4 MG tablet   Oral   Take 4 mg by mouth every 6 (six) hours. Scheduled as every 6 hours on prescription bottle         . simvastatin (ZOCOR) 40 MG tablet   Oral   Take 1 tablet by mouth Daily.          . valsartan-hydrochlorothiazide (DIOVAN-HCT) 320-25 MG per tablet   Oral   Take 1 tablet by mouth daily.           . Vitamin D, Ergocalciferol, (DRISDOL) 50000 UNITS CAPS   Oral   Take 1 capsule by mouth 2 (two) times a week.          . insulin glargine (LANTUS) 100 UNIT/ML injection   Subcutaneous    Inject 30 Units into the skin daily.   10 mL   0     Triage Vitals: BP 153/62  Pulse 101  Temp(Src) 98.2 F (36.8 C) (Oral)  Resp 18  SpO2 97%  Physical Exam  Nursing note and vitals reviewed. Constitutional: She appears well-developed and well-nourished. No distress.  HENT:  Head: Normocephalic and atraumatic.  Right Ear: External ear normal.  Left Ear: External ear normal.  Eyes: EOM are normal. Pupils are equal, round, and reactive to light.  Neck: Normal range of motion. Neck supple. No tracheal deviation present.  Cardiovascular: Normal rate, regular rhythm and normal heart sounds.   Pulmonary/Chest: Effort normal. No respiratory distress.  Abdominal: Soft. She exhibits no distension.  Musculoskeletal: Normal range of motion. She exhibits no edema.  Neurological:  Disoriented, confused to time and event,   Skin: Skin is warm and dry.  Psychiatric: She has a normal mood and affect. Her behavior is normal.    ED Course  Procedures (including critical care time)   On initial exam, patient found to be confused, is unable to articulate answers to questions.  Patient's skin cool, diaphoretic.  Blood sugar checked, found to be hypoglycemic with a CBG of 25 mg/dl.   Patient treated with IVF, dextrose, with improvement, returning to baseline.  Patient provided with a meal.     DIAGNOSTIC STUDIES: Oxygen Saturation is 97% on room air, normal by my interpretation.    COORDINATION OF CARE:  5:02 PM: Discussed treatment plan which includes  X-rays of bilateral ankles and pain medication with pt at bedside and pt agreed to plan.     Labs Reviewed - No data to display Dg Ankle Complete Left  03/21/2012  *RADIOLOGY REPORT*  Clinical Data: Ankle pain after fall.  LEFT ANKLE COMPLETE - 3+ VIEW  Comparison: None.  Findings: No fracture or dislocation is noted.  Joint spaces are intact. No soft tissue abnormality is noted.  IMPRESSION: Normal left ankle.   Original Report  Authenticated By: Lupita Raider.,  M.D.    Dg Ankle Complete Right  03/21/2012  *RADIOLOGY REPORT*  Clinical Data: Ankle pain after fall.  RIGHT ANKLE - COMPLETE 3+ VIEW  Comparison: None.  Findings: Mildly displaced oblique fracture of distal fibula is noted.  Moderately displaced and mildly comminuted fracture of  medial malleolus is noted.  No dislocation is noted.  IMPRESSION: Mildly displaced distal fibular fracture.  Moderately displaced and comminuted medial malleolar fracture.   Original Report Authenticated By: Lupita Raider.,  M.D.      No diagnosis found.  Discussed with Dr. Luiz Blare (orthopedics)--patient will need surgical repair.  Patient would like to schedule as an outpatient.  Splinted, crutches provided. Patient to follow-up with Dr. Luiz Blare in the office on Thursday.  MDM     I personally performed the services described in this documentation, which was scribed in my presence. The recorded information has been reviewed and is accurate.       Jimmye Norman, NP 03/21/12 [phone]

## 2012-03-23 ENCOUNTER — Other Ambulatory Visit (HOSPITAL_COMMUNITY): Payer: Self-pay | Admitting: Orthopedic Surgery

## 2012-03-23 NOTE — ED Provider Notes (Signed)
Medical screening examination/treatment/procedure(s) were performed by non-physician practitioner and as supervising physician I was immediately available for consultation/collaboration.  Stephen Kohut, MD 03/23/12 0103 

## 2012-03-24 ENCOUNTER — Ambulatory Visit (HOSPITAL_COMMUNITY): Payer: BC Managed Care – PPO | Admitting: Certified Registered"

## 2012-03-24 ENCOUNTER — Observation Stay (HOSPITAL_COMMUNITY)
Admission: RE | Admit: 2012-03-24 | Discharge: 2012-03-25 | Disposition: A | Discharge status: Home / Self Care | Payer: BC Managed Care – PPO | Source: Ambulatory Visit | Attending: Orthopedic Surgery | Admitting: Orthopedic Surgery

## 2012-03-24 ENCOUNTER — Encounter (HOSPITAL_COMMUNITY): Payer: Self-pay | Admitting: Pharmacy Technician

## 2012-03-24 ENCOUNTER — Ambulatory Visit (HOSPITAL_COMMUNITY): Payer: BC Managed Care – PPO

## 2012-03-24 ENCOUNTER — Encounter (HOSPITAL_COMMUNITY): Payer: Self-pay | Admitting: Certified Registered"

## 2012-03-24 ENCOUNTER — Encounter (HOSPITAL_COMMUNITY)
Admission: RE | Disposition: A | Discharge status: Home / Self Care | Payer: Self-pay | Source: Ambulatory Visit | Attending: Orthopedic Surgery

## 2012-03-24 DIAGNOSIS — S82891A Other fracture of right lower leg, initial encounter for closed fracture: Secondary | ICD-10-CM

## 2012-03-24 DIAGNOSIS — Z794 Long term (current) use of insulin: Secondary | ICD-10-CM | POA: Insufficient documentation

## 2012-03-24 DIAGNOSIS — E1142 Type 2 diabetes mellitus with diabetic polyneuropathy: Secondary | ICD-10-CM | POA: Insufficient documentation

## 2012-03-24 DIAGNOSIS — E1149 Type 2 diabetes mellitus with other diabetic neurological complication: Secondary | ICD-10-CM | POA: Insufficient documentation

## 2012-03-24 DIAGNOSIS — I10 Essential (primary) hypertension: Secondary | ICD-10-CM | POA: Insufficient documentation

## 2012-03-24 DIAGNOSIS — S82843A Displaced bimalleolar fracture of unspecified lower leg, initial encounter for closed fracture: Principal | ICD-10-CM | POA: Insufficient documentation

## 2012-03-24 DIAGNOSIS — W19XXXA Unspecified fall, initial encounter: Secondary | ICD-10-CM | POA: Insufficient documentation

## 2012-03-24 HISTORY — PX: ORIF ANKLE FRACTURE: SHX5408

## 2012-03-24 LAB — BASIC METABOLIC PANEL
BUN: 15 mg/dL (ref 6–23)
CO2: 25 mEq/L (ref 19–32)
Calcium: 9.9 mg/dL (ref 8.4–10.5)
Chloride: 101 mEq/L (ref 96–112)
Creatinine, Ser: 0.59 mg/dL (ref 0.50–1.10)
GFR calc Af Amer: >90 mL/min (ref >90)
GFR calc non Af Amer: >90 mL/min (ref >90)
Glucose, Bld: 287 mg/dL — ABNORMAL HIGH (ref 70–99)
Potassium: 4.4 mEq/L (ref 3.5–5.1)
Sodium: 137 mEq/L (ref 135–145)

## 2012-03-24 LAB — CBC
HCT: 33.4 % — ABNORMAL LOW (ref 36.0–46.0)
Hemoglobin: 11.3 g/dL — ABNORMAL LOW (ref 12.0–15.0)
MCH: 31 pg (ref 26.0–34.0)
MCHC: 33.8 g/dL (ref 30.0–36.0)
MCV: 91.8 fL (ref 78.0–100.0)
Platelets: 299 10*3/uL (ref 150–400)
RBC: 3.64 MIL/uL — ABNORMAL LOW (ref 3.87–5.11)
RDW: 13.3 % (ref 11.5–15.5)
WBC: 8.1 10*3/uL (ref 4.0–10.5)

## 2012-03-24 LAB — PROTIME-INR
INR: 1.01 (ref 0.00–1.49)
Prothrombin Time: 13.2 seconds (ref 11.6–15.2)

## 2012-03-24 LAB — APTT: aPTT: 32 seconds (ref 24–37)

## 2012-03-24 LAB — GLUCOSE, CAPILLARY
Glucose-Capillary: 268 mg/dL — ABNORMAL HIGH (ref 70–99)
Glucose-Capillary: 249 mg/dL — ABNORMAL HIGH (ref 70–99)
Glucose-Capillary: 241 mg/dL — ABNORMAL HIGH (ref 70–99)
Glucose-Capillary: 229 mg/dL — ABNORMAL HIGH (ref 70–99)
Glucose-Capillary: 219 mg/dL — ABNORMAL HIGH (ref 70–99)
Glucose-Capillary: 132 mg/dL — ABNORMAL HIGH (ref 70–99)

## 2012-03-24 LAB — SURGICAL PCR SCREEN
MRSA, PCR: NEGATIVE
Staphylococcus aureus: NEGATIVE

## 2012-03-24 SURGERY — OPEN REDUCTION INTERNAL FIXATION (ORIF) ANKLE FRACTURE
Anesthesia: General | Site: Ankle | Laterality: Right | Wound class: Clean

## 2012-03-24 MED ORDER — ONDANSETRON HCL 4 MG/2ML IJ SOLN
INTRAMUSCULAR | Status: DC | PRN
  Administered 2012-03-24: 4 mg via INTRAVENOUS

## 2012-03-24 MED ORDER — MUPIROCIN 2 % EX OINT
TOPICAL_OINTMENT | CUTANEOUS | Status: AC
  Administered 2012-03-24: 1
  Filled 2012-03-24: qty 22

## 2012-03-24 MED ORDER — INSULIN ASPART 100 UNIT/ML ~~LOC~~ SOLN
4.0000 [IU] | Freq: Three times a day (TID) | SUBCUTANEOUS | Status: DC
  Administered 2012-03-24 – 2012-03-25 (×4): 4 [IU] via SUBCUTANEOUS

## 2012-03-24 MED ORDER — HYDROCODONE-ACETAMINOPHEN 5-325 MG PO TABS
1.0000 | ORAL_TABLET | ORAL | Status: DC | PRN
  Administered 2012-03-25: 2 via ORAL
  Filled 2012-03-24: qty 2

## 2012-03-24 MED ORDER — FENTANYL CITRATE 0.05 MG/ML IJ SOLN
INTRAMUSCULAR | Status: AC
  Administered 2012-03-24: 100 ug
  Filled 2012-03-24: qty 2

## 2012-03-24 MED ORDER — HYDROMORPHONE HCL PF 1 MG/ML IJ SOLN
0.2500 mg | INTRAMUSCULAR | Status: DC | PRN
  Administered 2012-03-24 (×3): 0.5 mg via INTRAVENOUS

## 2012-03-24 MED ORDER — IRBESARTAN 300 MG PO TABS
300.0000 mg | ORAL_TABLET | Freq: Every day | ORAL | Status: DC
  Administered 2012-03-24 – 2012-03-25 (×2): 300 mg via ORAL
  Filled 2012-03-24 (×2): qty 1

## 2012-03-24 MED ORDER — CEFAZOLIN SODIUM-DEXTROSE 2-3 GM-% IV SOLR
INTRAVENOUS | Status: AC
  Administered 2012-03-24: 2 g via INTRAVENOUS
  Filled 2012-03-24: qty 50

## 2012-03-24 MED ORDER — HYDROMORPHONE HCL PF 1 MG/ML IJ SOLN
INTRAMUSCULAR | Status: AC
  Filled 2012-03-24: qty 1

## 2012-03-24 MED ORDER — PROPOFOL 10 MG/ML IV BOLUS
INTRAVENOUS | Status: DC | PRN
  Administered 2012-03-24: 200 mg via INTRAVENOUS

## 2012-03-24 MED ORDER — ATORVASTATIN CALCIUM 40 MG PO TABS
40.0000 mg | ORAL_TABLET | Freq: Every day | ORAL | Status: DC
  Administered 2012-03-24 – 2012-03-25 (×2): 40 mg via ORAL
  Filled 2012-03-24 (×2): qty 1

## 2012-03-24 MED ORDER — MUPIROCIN 2 % EX OINT
TOPICAL_OINTMENT | Freq: Two times a day (BID) | CUTANEOUS | Status: DC
  Filled 2012-03-24: qty 22

## 2012-03-24 MED ORDER — ARTIFICIAL TEARS OP OINT
TOPICAL_OINTMENT | OPHTHALMIC | Status: DC | PRN
  Administered 2012-03-24: 1 via OPHTHALMIC

## 2012-03-24 MED ORDER — FENTANYL CITRATE 0.05 MG/ML IJ SOLN
INTRAMUSCULAR | Status: DC | PRN
  Administered 2012-03-24: 50 ug via INTRAVENOUS
  Administered 2012-03-24: 100 ug via INTRAVENOUS
  Administered 2012-03-24 (×2): 50 ug via INTRAVENOUS

## 2012-03-24 MED ORDER — ASPIRIN EC 81 MG PO TBEC
81.0000 mg | DELAYED_RELEASE_TABLET | Freq: Every day | ORAL | Status: DC
  Administered 2012-03-25: 81 mg via ORAL
  Filled 2012-03-24: qty 1

## 2012-03-24 MED ORDER — PHENYLEPHRINE HCL 10 MG/ML IJ SOLN
INTRAMUSCULAR | Status: DC | PRN
  Administered 2012-03-24: 200 ug via INTRAVENOUS
  Administered 2012-03-24 (×2): 80 ug via INTRAVENOUS
  Administered 2012-03-24: 40 ug via INTRAVENOUS
  Administered 2012-03-24 (×2): 80 ug via INTRAVENOUS
  Administered 2012-03-24: 40 ug via INTRAVENOUS
  Administered 2012-03-24: 120 ug via INTRAVENOUS
  Administered 2012-03-24: 80 ug via INTRAVENOUS
  Administered 2012-03-24: 160 ug via INTRAVENOUS
  Administered 2012-03-24 (×2): 120 ug via INTRAVENOUS

## 2012-03-24 MED ORDER — LIDOCAINE HCL (CARDIAC) 20 MG/ML IV SOLN
INTRAVENOUS | Status: DC | PRN
  Administered 2012-03-24: 100 mg via INTRAVENOUS

## 2012-03-24 MED ORDER — METOCLOPRAMIDE HCL 5 MG/ML IJ SOLN: 5.0000 mg | Freq: Three times a day (TID) | INTRAMUSCULAR | Status: DC | PRN

## 2012-03-24 MED ORDER — LACTATED RINGERS IV SOLN
INTRAVENOUS | Status: DC
  Administered 2012-03-24: 14:00:00 via INTRAVENOUS

## 2012-03-24 MED ORDER — OXYCODONE-ACETAMINOPHEN 5-325 MG PO TABS
1.0000 | ORAL_TABLET | ORAL | Status: DC | PRN
  Administered 2012-03-24 – 2012-03-25 (×4): 2 via ORAL
  Filled 2012-03-24 (×3): qty 2

## 2012-03-24 MED ORDER — OXYCODONE-ACETAMINOPHEN 5-325 MG PO TABS
ORAL_TABLET | ORAL | Status: AC
  Filled 2012-03-24: qty 2

## 2012-03-24 MED ORDER — ONDANSETRON HCL 4 MG PO TABS: 4.0000 mg | ORAL_TABLET | Freq: Four times a day (QID) | ORAL | Status: DC | PRN

## 2012-03-24 MED ORDER — VALSARTAN-HYDROCHLOROTHIAZIDE 320-25 MG PO TABS
1.0000 | ORAL_TABLET | Freq: Every day | ORAL | Status: DC
Start: 2012-03-24 — End: 2012-03-24

## 2012-03-24 MED ORDER — SODIUM CHLORIDE 0.9 % IV SOLN: INTRAVENOUS | Status: DC

## 2012-03-24 MED ORDER — HYDROMORPHONE HCL PF 1 MG/ML IJ SOLN
0.5000 mg | INTRAMUSCULAR | Status: DC | PRN
  Administered 2012-03-24 – 2012-03-25 (×3): 1 mg via INTRAVENOUS
  Filled 2012-03-24 (×3): qty 1

## 2012-03-24 MED ORDER — ONDANSETRON HCL 4 MG/2ML IJ SOLN: 4.0000 mg | Freq: Four times a day (QID) | INTRAMUSCULAR | Status: DC | PRN

## 2012-03-24 MED ORDER — CEFAZOLIN SODIUM-DEXTROSE 2-3 GM-% IV SOLR
2.0000 g | Freq: Four times a day (QID) | INTRAVENOUS | Status: AC
  Administered 2012-03-24 – 2012-03-25 (×3): 2 g via INTRAVENOUS
  Filled 2012-03-24 (×3): qty 50

## 2012-03-24 MED ORDER — ONDANSETRON HCL 4 MG/2ML IJ SOLN: 4.0000 mg | Freq: Once | INTRAMUSCULAR | Status: DC | PRN

## 2012-03-24 MED ORDER — METOCLOPRAMIDE HCL 10 MG PO TABS: 5.0000 mg | ORAL_TABLET | Freq: Three times a day (TID) | ORAL | Status: DC | PRN

## 2012-03-24 MED ORDER — 0.9 % SODIUM CHLORIDE (POUR BTL) OPTIME
TOPICAL | Status: DC | PRN
  Administered 2012-03-24: 1000 mL

## 2012-03-24 MED ORDER — INSULIN ASPART 100 UNIT/ML ~~LOC~~ SOLN
0.0000 [IU] | Freq: Three times a day (TID) | SUBCUTANEOUS | Status: DC
  Administered 2012-03-24: 5 [IU] via SUBCUTANEOUS
  Administered 2012-03-25: 3 [IU] via SUBCUTANEOUS
  Administered 2012-03-25 (×2): 5 [IU] via SUBCUTANEOUS

## 2012-03-24 MED ORDER — HYDROCHLOROTHIAZIDE 25 MG PO TABS
25.0000 mg | ORAL_TABLET | Freq: Every day | ORAL | Status: DC
  Administered 2012-03-25: 25 mg via ORAL
  Filled 2012-03-24: qty 1

## 2012-03-24 SURGICAL SUPPLY — 54 items
BANDAGE ESMARK 6X9 LF (GAUZE/BANDAGES/DRESSINGS) IMPLANT
BANDAGE GAUZE ELAST BULKY 4 IN (GAUZE/BANDAGES/DRESSINGS) ×1 IMPLANT
BIT DRILL 2.5X2.75 QC CALB (BIT) ×1 IMPLANT
BIT DRILL 2.9 CANN QC NONSTRL (BIT) ×1 IMPLANT
BNDG CMPR 9X6 STRL LF SNTH (GAUZE/BANDAGES/DRESSINGS)
BNDG COHESIVE 4X5 TAN STRL (GAUZE/BANDAGES/DRESSINGS) ×2 IMPLANT
BNDG ESMARK 6X9 LF (GAUZE/BANDAGES/DRESSINGS)
BNDG GAUZE STRTCH 6 (GAUZE/BANDAGES/DRESSINGS) ×1 IMPLANT
CLOTH BEACON ORANGE TIMEOUT ST (SAFETY) ×2 IMPLANT
COVER SURGICAL LIGHT HANDLE (MISCELLANEOUS) ×2 IMPLANT
CUFF TOURNIQUET SINGLE 34IN LL (TOURNIQUET CUFF) IMPLANT
CUFF TOURNIQUET SINGLE 44IN (TOURNIQUET CUFF) IMPLANT
DRAPE C-ARM MINI 42X72 WSTRAPS (DRAPES) ×1 IMPLANT
DRAPE INCISE IOBAN 66X45 STRL (DRAPES) ×2 IMPLANT
DRAPE PROXIMA HALF (DRAPES) ×2 IMPLANT
DRAPE U-SHAPE 47X51 STRL (DRAPES) ×2 IMPLANT
DRSG ADAPTIC 3X8 NADH LF (GAUZE/BANDAGES/DRESSINGS) ×2 IMPLANT
DRSG PAD ABDOMINAL 8X10 ST (GAUZE/BANDAGES/DRESSINGS) ×2 IMPLANT
DURAPREP 26ML APPLICATOR (WOUND CARE) ×2 IMPLANT
ELECT REM PT RETURN 9FT ADLT (ELECTROSURGICAL) ×2
ELECTRODE REM PT RTRN 9FT ADLT (ELECTROSURGICAL) ×1 IMPLANT
GLOVE BIOGEL PI IND STRL 9 (GLOVE) ×1 IMPLANT
GLOVE BIOGEL PI INDICATOR 9 (GLOVE) ×1
GLOVE SURG ORTHO 9.0 STRL STRW (GLOVE) ×2 IMPLANT
GOWN PREVENTION PLUS XLARGE (GOWN DISPOSABLE) ×2 IMPLANT
GOWN SRG XL XLNG 56XLVL 4 (GOWN DISPOSABLE) ×2 IMPLANT
GOWN STRL NON-REIN XL XLG LVL4 (GOWN DISPOSABLE) ×4
K-WIRE ACE 1.6X6 (WIRE) ×4
KIT BASIN OR (CUSTOM PROCEDURE TRAY) ×2 IMPLANT
KIT ROOM TURNOVER OR (KITS) ×2 IMPLANT
KWIRE ACE 1.6X6 (WIRE) IMPLANT
MANIFOLD NEPTUNE II (INSTRUMENTS) ×2 IMPLANT
NS IRRIG 1000ML POUR BTL (IV SOLUTION) ×2 IMPLANT
PACK ORTHO EXTREMITY (CUSTOM PROCEDURE TRAY) ×2 IMPLANT
PAD ARMBOARD 7.5X6 YLW CONV (MISCELLANEOUS) ×4 IMPLANT
PADDING CAST COTTON 6X4 STRL (CAST SUPPLIES) ×1 IMPLANT
PLATE LOCK 7H 92 BILAT FIB (Plate) ×1 IMPLANT
SCREW ACE CAN 4.0 44M (Screw) ×2 IMPLANT
SCREW CORTICAL 3.5MM  12MM (Screw) ×3 IMPLANT
SCREW CORTICAL 3.5MM 12MM (Screw) IMPLANT
SCREW CORTICAL 3.5MM 22MM (Screw) ×1 IMPLANT
SCREW LOCK CORT STAR 3.5X14 (Screw) ×1 IMPLANT
SPONGE GAUZE 4X4 12PLY (GAUZE/BANDAGES/DRESSINGS) ×2 IMPLANT
SPONGE LAP 18X18 X RAY DECT (DISPOSABLE) ×2 IMPLANT
STAPLER VISISTAT 35W (STAPLE) ×1 IMPLANT
SUCTION FRAZIER TIP 10 FR DISP (SUCTIONS) ×2 IMPLANT
SUT ETHILON 2 0 PSLX (SUTURE) ×2 IMPLANT
SUT VIC AB 0 CT1 27 (SUTURE) ×6
SUT VIC AB 0 CT1 27XBRD ANBCTR (SUTURE) IMPLANT
SUT VIC AB 2-0 CTB1 (SUTURE) ×2 IMPLANT
TOWEL OR 17X24 6PK STRL BLUE (TOWEL DISPOSABLE) ×2 IMPLANT
TOWEL OR 17X26 10 PK STRL BLUE (TOWEL DISPOSABLE) ×2 IMPLANT
TUBE CONNECTING 12X1/4 (SUCTIONS) ×2 IMPLANT
WATER STERILE IRR 1000ML POUR (IV SOLUTION) ×1 IMPLANT

## 2012-03-24 NOTE — Op Note (Signed)
OPERATIVE REPORT  DATE OF SURGERY: 03/24/2012  PATIENT:  Sarah [name],  67 y.o. female  PRE-OPERATIVE DIAGNOSIS:  Right Bimalleolar Ankle Fracture  POST-OPERATIVE DIAGNOSIS:  Right Bimalleolar Ankle Fracture  PROCEDURE:  Procedure(s): OPEN REDUCTION INTERNAL FIXATION (ORIF) ANKLE FRACTURE  SURGEON:  Surgeon(s): Nadara Mustard, MD  ANESTHESIA:   general  EBL:  min ML  SPECIMEN:  No Specimen  TOURNIQUET:  * No tourniquets in log *  PROCEDURE DETAILS: Patient is a 67 year old woman with diabetic insensate neuropathy fell sustaining a bimalleolar right ankle fracture. She has an unstable mortise and presents at this time for internal fixation. Risks and benefits were discussed including infection neurovascular injury pain need for additional surgery patient states she understands and wishes to proceed at this time. Description of procedure patient was brought to the operating room and underwent a general anesthetic. After adequate levels of anesthesia were obtained patient's right lower extremity was prepped using DuraPrep draped into a sterile field. A lateral incision was made over the fibula this is carried sharply down to bone the fracture was reduced freshened and stabilize the lag screw. A locking plate was then applied laterally with locking screws distally compression screws proximally. C-arm fluoroscopy verified alignment. The wound was irrigated an incision was closed using Vicryl subcutaneous closure and staples in the skin. Attention was then focused to the medial malleolus. Surgeon was made over the medial malleolus this was carried down to the fracture the fracture was cleansed reduced and irrigated. K wires were placed to stabilize the fracture and 2 cannulated screws 44 mm in length were placed to stabilize the fracture. C-arm fluoroscopy verified alignment of the mortise. The wound was irrigated with normal saline subcutaneous is closed using Vicryl the skin was closed using  staples the wound was covered with Adaptic orthopedic sponges AB dressing Kerlix and Coban. Patient was extubated taken to the PACU in stable condition.  PLAN OF CARE: Admit to inpatient   PATIENT DISPOSITION:  PACU - hemodynamically stable.   Nadara Mustard, MD 03/24/2012 4:37 PM

## 2012-03-24 NOTE — Anesthesia Postprocedure Evaluation (Signed)
  Anesthesia Post-op Note  Patient: Sarah [name]  Procedure(s) Performed: Procedure(s) with comments: OPEN REDUCTION INTERNAL FIXATION (ORIF) ANKLE FRACTURE (Right) - Open Reduction Internal Fixation Right Bimalleolar ankle fracture  Patient Location: PACU  Anesthesia Type:General  Level of Consciousness: awake, oriented, sedated and patient cooperative  Airway and Oxygen Therapy: Patient Spontanous Breathing  Post-op Pain: moderate  Post-op Assessment: Post-op Vital signs reviewed, Patient's Cardiovascular Status Stable, Respiratory Function Stable, Patent Airway, No signs of Nausea or vomiting and Pain level controlled  Post-op Vital Signs: stable  Complications: No apparent anesthesia complications

## 2012-03-24 NOTE — Preoperative (Signed)
Beta Blockers   Reason not to administer Beta Blockers:Not Applicable 

## 2012-03-24 NOTE — Anesthesia Procedure Notes (Signed)
Procedure Name: LMA Insertion Date/Time: 03/24/2012 3:49 PM Performed by: Jefm Miles E Pre-anesthesia Checklist: Patient identified, Timeout performed, Emergency Drugs available, Suction available and Patient being monitored Patient Re-evaluated:Patient Re-evaluated prior to inductionOxygen Delivery Method: Circle system utilized Preoxygenation: Pre-oxygenation with 100% oxygen Intubation Type: IV induction LMA: LMA inserted LMA Size: 4.0 Number of attempts: 1 Placement Confirmation: positive ETCO2 and breath sounds checked- equal and bilateral Tube secured with: Tape Dental Injury: Teeth and Oropharynx as per pre-operative assessment

## 2012-03-24 NOTE — H&P (Signed)
Moksha Dorgan is an 67 y.o. female.   Chief Complaint: Lannette Donath B. right ankle fracture HPI: Patient is a 67 year old woman who had a ground-level fall sustaining the ankle fracture patient denies any other injuries.  Past Medical History  Diagnosis Date  . Heart murmur   . Hyperlipidemia   . Diabetes mellitus     takes Diovan for her kidneys  . Boils   . Abscess   . HTN (hypertension)   . Hx of adenomatous colonic polyps   . Glaucoma(365)   . Osteoporosis   . Pneumonia     Past Surgical History  Procedure Laterality Date  . Colonoscopy    . Polypectomy    . Abdominal hysterectomy    . Tonsillectomy    . Carpal tunnel release      right  . Knee arthroscopy      right    Family History  Problem Relation Age of Onset  . Colon cancer Neg Hx   . Esophageal cancer Neg Hx   . Rectal cancer Neg Hx   . Stomach cancer Neg Hx   . Diabetes Mother   . Hypertension Mother    Social History:  reports that she has been smoking.  She has never used smokeless tobacco. She reports that  drinks alcohol. She reports that she does not use illicit drugs.  Allergies: No Known Allergies  No prescriptions prior to admission    No results found for this or any previous visit (from the past 48 hour(s)). No results found.  Review of Systems  All other systems reviewed and are negative.    There were no vitals taken for this visit. Physical Exam  On examination patient has palpable pulses. The skin is intact. Radiographs shows a bimalleolar Weber B. fracture. Patient does have an insensate neuropathy from her diabetes. Assessment/Plan Assessment: Unstable Weber B. bimalleolar right ankle fracture. Diabetic insensate neuropathy.Estimated body mass index is 31.30 kg/(m^2) as calculated from the following:   Height as of 09/24/11: 5' (1.524 m).   Weight as of 09/23/11: 72.7 kg (160 lb 4.4 oz).  Plan: Will plan for open reduction internal fixation of the right tibia and  fibula. Risks and benefits were discussed including infection neurovascular injury pain arthritis need for additional surgery. Patient states she understands and wishes to proceed at this time. Do to the fact that she is diabetic with elevated BMI most likely will need to place a tibial calcaneal Steinmann pin to stabilize the reduction and minimize the risk of Charcot collapse.  DUDA,[name] V 03/24/2012, 6:32 AM

## 2012-03-24 NOTE — Progress Notes (Signed)
Pt had been complaining of 7/10  Pain...she at this moment, after receiving IV Fentanyl 100 mcg is now a 4/10.Marland KitchenMarland KitchenDA

## 2012-03-24 NOTE — Transfer of Care (Signed)
Immediate Anesthesia Transfer of Care Note  Patient: Sarah [name]  Procedure(s) Performed: Procedure(s) with comments: OPEN REDUCTION INTERNAL FIXATION (ORIF) ANKLE FRACTURE (Right) - Open Reduction Internal Fixation Right Bimalleolar ankle fracture  Patient Location: PACU  Anesthesia Type:General  Level of Consciousness: awake, alert  and oriented  Airway & Oxygen Therapy: Patient Spontanous Breathing and Patient connected to nasal cannula oxygen  Post-op Assessment: Report given to PACU RN  Post vital signs: Reviewed and stable  Complications: No apparent anesthesia complications

## 2012-03-24 NOTE — Anesthesia Preprocedure Evaluation (Addendum)
Anesthesia Evaluation  Patient identified by MRN, date of birth, ID band Patient awake    Reviewed: Allergy & Precautions, H&P , NPO status , Patient's Chart, lab work & pertinent test results  Airway Mallampati: I TM Distance: >3 FB Neck ROM: full    Dental  (+) Teeth Intact, Dental Advisory Given and Chipped,    Pulmonary pneumonia -, resolved,          Cardiovascular hypertension, Pt. on medications + Valvular Problems/Murmurs Rhythm:Regular Rate:Normal     Neuro/Psych    GI/Hepatic   Endo/Other  diabetes, Poorly Controlled, Type 2, Insulin Dependent and Oral Hypoglycemic Agents  Renal/GU      Musculoskeletal   Abdominal   Peds  Hematology   Anesthesia Other Findings   Reproductive/Obstetrics                          Anesthesia Physical Anesthesia Plan  ASA: III  Anesthesia Plan: General   Post-op Pain Management:    Induction: Intravenous  Airway Management Planned: LMA and Oral ETT  Additional Equipment:   Intra-op Plan:   Post-operative Plan: Extubation in OR  Informed Consent: I have reviewed the patients History and Physical, chart, labs and discussed the procedure including the risks, benefits and alternatives for the proposed anesthesia with the patient or authorized representative who has indicated his/her understanding and acceptance.     Plan Discussed with: CRNA, Anesthesiologist and Surgeon  Anesthesia Plan Comments:         Anesthesia Quick Evaluation

## 2012-03-25 ENCOUNTER — Encounter (HOSPITAL_COMMUNITY): Payer: Self-pay | Admitting: Orthopedic Surgery

## 2012-03-25 LAB — GLUCOSE, CAPILLARY
Glucose-Capillary: 229 mg/dL — ABNORMAL HIGH (ref 70–99)
Glucose-Capillary: 193 mg/dL — ABNORMAL HIGH (ref 70–99)
Glucose-Capillary: 227 mg/dL — ABNORMAL HIGH (ref 70–99)

## 2012-03-25 MED ORDER — OXYCODONE-ACETAMINOPHEN 5-325 MG PO TABS: 1.0000 | ORAL_TABLET | ORAL | Status: DC | PRN

## 2012-03-25 NOTE — Progress Notes (Signed)
UR completed 

## 2012-03-25 NOTE — Progress Notes (Signed)
Physical Therapy Treatment Patient Details Name: Sarah [name] MRN: [id number] DOB: 1945/03/23 Today's Date: 03/25/2012 Time: 8469-6295 PT Time Calculation (min): 30 min  PT Assessment / Plan / Recommendation Comments on Treatment Session  Pt scheduled for d/c home today.  She reports she will have assist from her sister and mother.  Pt educated on ascend/descend one step with RW to enter/exit her house.  PT recommends a w/c with elevating legrest for home and community use.  Gait distance with RW will be limited to very short distances  being NWB and in the cam boot.Pt verbalizes understanding and voices no questions or concerns.    Follow Up Recommendations  No PT follow up     Does the patient have the potential to tolerate intense rehabilitation     Barriers to Discharge        Equipment Recommendations  Rolling walker with 5" wheels;Wheelchair (measurements PT);Other (comment) (elevating legrests for w/c)    Recommendations for Other Services    Frequency Min 6X/week   Plan Discharge plan remains appropriate    Precautions / Restrictions Precautions Required Braces or Orthoses: Other Brace/Splint Other Brace/Splint: cam boot Restrictions RLE Weight Bearing: Non weight bearing   Pertinent Vitals/Pain 8/10    Mobility  Bed Mobility Supine to Sit: 5: Supervision Details for Bed Mobility Assistance: increased time required to complete Transfers Sit to Stand: 4: Min guard;From bed;From toilet;With upper extremity assist Stand to Sit: 4: Min guard;To bed;To toilet;With upper extremity assist Details for Transfer Assistance: verbal cues needed for safety Ambulation/Gait Ambulation/Gait Assistance: 4: Min guard Ambulation Distance (Feet): 15 Feet (x 2) Assistive device: Rolling walker Ambulation/Gait Assistance Details: verbal cues for RW management Gait Pattern: Step-to pattern Gait velocity: decreased Stairs: Yes Stairs Assistance: 3: Mod assist Stairs Assistance  Details (indicate cue type and reason): verbal cues for sequencing, assist to support RW Stair Management Technique: Forwards;With walker Number of Stairs: 1    Exercises     PT Diagnosis:    PT Problem List:   PT Treatment Interventions:     PT Goals Acute Rehab PT Goals PT Goal: Sit to Stand - Progress: Progressing toward goal PT Goal: Stand to Sit - Progress: Progressing toward goal PT Transfer Goal: Bed to Chair/Chair to Bed - Progress: Progressing toward goal PT Goal: Ambulate - Progress: Progressing toward goal PT Goal: Up/Down Stairs - Progress: Progressing toward goal  Visit Information  Last PT Received On: 03/25/12 Assistance Needed: +1    Subjective Data      Cognition  Cognition Overall Cognitive Status: Appears within functional limits for tasks assessed/performed Arousal/Alertness: Awake/alert Orientation Level: Appears intact for tasks assessed Behavior During Session: [facility] for tasks performed    Balance     End of Session PT - End of Session Equipment Utilized During Treatment: Gait belt Activity Tolerance: Patient limited by pain Patient left: in bed;with call bell/phone within reach Nurse Communication: Mobility status;Patient requests pain meds   GP Functional Assessment Tool Used: clinical judgement Functional Limitation: Mobility: Walking and moving around Mobility: Walking and Moving Around Current Status [phone]): At least 1 percent but less than 20 percent impaired, limited or restricted Mobility: Walking and Moving Around Discharge Status [phone]): At least 1 percent but less than 20 percent impaired, limited or restricted   Ilda Foil 03/25/2012, 3:11 PM  Aida Raider, PT  Office # [phone] Pager [phone]

## 2012-03-25 NOTE — Progress Notes (Signed)
CARE MANAGEMENT NOTE 03/25/2012  Patient:  Sarah [name], Sarah [name]   Account Number:  [id number]  Date Initiated:  03/25/2012  Documentation initiated by:  Lanier Clam  Subjective/Objective Assessment:   Admitted w/R ankle fx.     Action/Plan:   From home   Anticipated DC Date:  03/25/2012   Anticipated DC Plan:  HOME/SELF CARE         Choice offered to / List presented to:     DME arranged  WALKER - ROLLING  WHEELCHAIR - MANUAL      DME agency  [facility]        Status of service:  Completed, signed off Medicare Important Message given?   (If response is "NO", the following Medicare IM given date fields will be blank) Date Medicare IM given:   Date Additional Medicare IM given:    Discharge Disposition:  HOME/SELF CARE  Per UR Regulation:  Reviewed for med. necessity/level of care/duration of stay  If discussed at Long Length of Stay Meetings, dates discussed:    Comments:  03/25/12   1503 Vance Peper, RN BSN Case Manager

## 2012-03-25 NOTE — Progress Notes (Signed)
Orthopedic Tech Progress Note Patient Details:  Sarah [name] [birth year] [id number] CAM walker fitted for patient in room. Patient in considerable pain. Instructions for CAM Walker use and proper application explained to patient. Ortho Devices Type of Ortho Device: CAM walker Ortho Device/Splint Location: Right Ortho Device/Splint Interventions: Application   Asia R Thompson 03/25/2012, 12:44 PM

## 2012-03-25 NOTE — Discharge Summary (Signed)
Physician Discharge Summary  Patient ID: Sarah [name] MRN: [id number] DOB/AGE: 1945/02/18 67 y.o.  Admit date: 03/24/2012 Discharge date: 03/25/2012  Admission Diagnoses: Bimalleolar right ankle fracture closed Discharge Diagnoses: Bimalleolar right ankle fracture closed Active Problems:   * No active hospital problems. *   Discharged Condition: stable  Hospital Course: Patient's hospital course was essentially unremarkable. She underwent open reduction internal fixation of a bimalleolar right ankle fracture. Postoperatively she was stable underwent physical therapy and was discharged to home in stable condition.  Consults: None  Significant Diagnostic Studies: labs: Routine labs  Treatments: surgery: See operative note  Discharge Exam: Blood pressure 129/71, pulse 102, temperature 98.4 F (36.9 C), temperature source Oral, resp. rate 18, height 4\' 11"  (1.499 m), weight 78.9 kg (173 lb 15.1 oz), SpO2 97.00%. Incision/Wound: incision clean dry and intact  Disposition: 01-Home or Self Care  Discharge Orders   Future Orders Complete By Expires     Apply cam walker  As directed     Questions:      Laterality:  Right    Call MD / Call 911  As directed     Comments:      If you experience chest pain or shortness of breath, CALL 911 and be transported to the hospital emergency room.  If you develope a fever above 101 F, pus (white drainage) or increased drainage or redness at the wound, or calf pain, call your surgeon's office.    Constipation Prevention  As directed     Comments:      Drink plenty of fluids.  Prune juice may be helpful.  You may use a stool softener, such as Colace (over the counter) 100 mg twice a day.  Use MiraLax (over the counter) for constipation as needed.    Diet - low sodium heart healthy  As directed     Increase activity slowly as tolerated  As directed         Medication List    TAKE these medications       aspirin EC 81 MG tablet  Take 81  mg by mouth daily.     atorvastatin 40 MG tablet  Commonly known as:  LIPITOR  Take 40 mg by mouth daily.     HYDROcodone-acetaminophen 5-325 MG per tablet  Commonly known as:  NORCO/VICODIN  Take 1 tablet by mouth every 6 (six) hours as needed for pain.     insulin regular 100 units/mL injection  Commonly known as:  NOVOLIN R,HUMULIN R  Inject 22-24 Units into the skin 3 (three) times daily before meals. Patient uses 24 units at breakfast, 22 units at lunch, and 24 units at dinner.     oxyCODONE-acetaminophen 5-325 MG per tablet  Commonly known as:  ROXICET  Take 1 tablet by mouth every 4 (four) hours as needed for pain.     oxyCODONE-acetaminophen 5-325 MG per tablet  Commonly known as:  PERCOCET/ROXICET  Take 1 tablet by mouth every 4 (four) hours as needed for pain.     valsartan-hydrochlorothiazide 320-25 MG per tablet  Commonly known as:  DIOVAN-HCT  Take 1 tablet by mouth daily.     Vitamin D (Ergocalciferol) 50000 UNITS Caps  Commonly known as:  DRISDOL  Take 1 capsule by mouth 2 (two) times a week. Patient uses this medication on Monday, and Thursday.           Follow-up Information   Follow up with Barron Vanloan V, MD In 1 week.   Contact information:  [address] [place] [address] [phone]       Signed: Nadara Mustard 03/25/2012, 6:52 AM

## 2012-03-25 NOTE — Evaluation (Signed)
Physical Therapy Evaluation Patient Details Name: Sarah [name] MRN: [id number] DOB: 05-Mar-1945 Today's Date: 03/25/2012 Time: 4098-1191 PT Time Calculation (min): 15 min  PT Assessment / Plan / Recommendation Clinical Impression  Pt feels she would benefit from an additional day in the hospital prior to d/c home.  PT in agreement.  Pt lives with her mother, who is unable to provide mobility assist.  PT feels pt will be ready to d/c home tomorrow from a mobility standpoint. Pt would benefit from w/c for use in the community.    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 CAM boot Restrictions Weight Bearing Restrictions: Yes RLE Weight Bearing: Non weight bearing   Pertinent Vitals/Pain 8/10      Mobility  Bed Mobility Bed Mobility: Supine to Sit Supine to Sit: 4: Min guard Details for Bed Mobility Assistance: increased time required to complete Transfers Transfers: Sit to Stand;Stand to Sit Sit to Stand: With upper extremity assist;4: Min assist;From bed;From chair/3-in-1 Stand to Sit: 4: Min guard;To chair/3-in-1;With armrests Details for Transfer Assistance: verbal cues for hand placement Ambulation/Gait Ambulation/Gait Assistance: 4: Min guard Ambulation Distance (Feet): 15 Feet (x 2) Assistive device: Rolling walker Ambulation/Gait Assistance Details: verbal cues for sequencing, RW management Gait Pattern: Step-to pattern Gait velocity: decreased General Gait Details: Gait distance limited by  dizziness.    Exercises     PT Diagnosis: Difficulty walking;Acute pain  PT Problem List: Decreased strength;Decreased activity tolerance;Decreased mobility;Decreased knowledge of precautions;Decreased knowledge of use of DME;Pain PT Treatment Interventions: DME instruction;Gait training;Stair training;Functional mobility training;Therapeutic activities;Patient/family education   PT Goals Acute Rehab PT Goals PT Goal Formulation: With patient Time For Goal Achievement: 04/01/12 Potential to Achieve Goals: Good Pt will go Sit to Stand: with modified independence PT Goal: Sit to Stand - Progress: Goal set today Pt will go Stand to Sit: with modified independence PT Goal: Stand to Sit - Progress: Goal set today Pt will Transfer Bed to Chair/Chair to Bed: with modified independence PT Transfer Goal: Bed to Chair/Chair to Bed - Progress: Goal set today Pt will Ambulate: 16 - 50 feet;with supervision;with rolling walker PT Goal: Ambulate - Progress: Goal set today Pt will Go Up / Down Stairs: 1-2 stairs;with min assist;with rolling walker PT Goal: Up/Down Stairs - Progress: Goal set today  Visit Information  Last PT Received On: 03/25/12 Assistance Needed: +1    Subjective Data  Subjective: "I am feeling a little dizzy." Patient Stated Goal: home   Prior Functioning  Home Living Lives With: Family Available Help at Discharge: Family;Available 24 hours/day Type of Home: House Home Access: Stairs to enter [employer] of Steps: 1 Entrance Stairs-Rails: None Home Layout: Able to live on main level with bedroom/bathroom;Two level Home Adaptive Equipment: None Prior Function Level of Independence: Independent Able to Take Stairs?: Yes Driving: Yes Vocation: Part time employment Communication Communication: No difficulties    Cognition  Cognition Overall Cognitive Status: Appears within functional limits for tasks assessed/performed Arousal/Alertness:  Awake/alert Orientation Level: Appears intact for tasks assessed  Behavior During Session: [facility] for tasks performed    Extremity/Trunk Assessment     Balance    End of Session PT - End of Session Equipment Utilized During Treatment: Gait belt Activity Tolerance: Treatment limited secondary to medical complications (Comment);Patient limited by pain (dizziness) Patient left: in chair;with call bell/phone within reach;Other (comment) (R LE elevated on 2 pillows in recliner) Nurse Communication: Mobility status  GP Functional Assessment Tool Used: clinical judgement Functional Limitation: Mobility: Walking and moving around Mobility: Walking and Moving Around Current Status (J1914): At least 1 percent but less than 20 percent impaired, limited or restricted Mobility: Walking and Moving Around Goal Status [phone]): 0 percent impaired, limited or restricted   Ilda Foil 03/25/2012, 10:36 AM  Aida Raider, PT  Office # [phone] Pager [phone]

## 2012-07-12 DIAGNOSIS — R262 Difficulty in walking, not elsewhere classified: Secondary | ICD-10-CM | POA: Diagnosis not present

## 2012-07-12 DIAGNOSIS — M25676 Stiffness of unspecified foot, not elsewhere classified: Secondary | ICD-10-CM | POA: Diagnosis not present

## 2012-07-12 DIAGNOSIS — S8263XA Displaced fracture of lateral malleolus of unspecified fibula, initial encounter for closed fracture: Secondary | ICD-10-CM | POA: Diagnosis not present

## 2012-07-12 DIAGNOSIS — M25673 Stiffness of unspecified ankle, not elsewhere classified: Secondary | ICD-10-CM | POA: Diagnosis not present

## 2012-07-12 DIAGNOSIS — M6281 Muscle weakness (generalized): Secondary | ICD-10-CM | POA: Diagnosis not present

## 2012-07-19 DIAGNOSIS — M25673 Stiffness of unspecified ankle, not elsewhere classified: Secondary | ICD-10-CM | POA: Diagnosis not present

## 2012-07-19 DIAGNOSIS — S8263XA Displaced fracture of lateral malleolus of unspecified fibula, initial encounter for closed fracture: Secondary | ICD-10-CM | POA: Diagnosis not present

## 2012-07-19 DIAGNOSIS — M6281 Muscle weakness (generalized): Secondary | ICD-10-CM | POA: Diagnosis not present

## 2012-07-19 DIAGNOSIS — R262 Difficulty in walking, not elsewhere classified: Secondary | ICD-10-CM | POA: Diagnosis not present

## 2012-07-27 DIAGNOSIS — E1129 Type 2 diabetes mellitus with other diabetic kidney complication: Secondary | ICD-10-CM | POA: Diagnosis not present

## 2012-07-27 DIAGNOSIS — I10 Essential (primary) hypertension: Secondary | ICD-10-CM | POA: Diagnosis not present

## 2012-07-27 DIAGNOSIS — R809 Proteinuria, unspecified: Secondary | ICD-10-CM | POA: Diagnosis not present

## 2012-08-11 DIAGNOSIS — M722 Plantar fascial fibromatosis: Secondary | ICD-10-CM | POA: Diagnosis not present

## 2012-08-11 DIAGNOSIS — M25579 Pain in unspecified ankle and joints of unspecified foot: Secondary | ICD-10-CM | POA: Diagnosis not present

## 2012-08-11 DIAGNOSIS — M67919 Unspecified disorder of synovium and tendon, unspecified shoulder: Secondary | ICD-10-CM | POA: Diagnosis not present

## 2012-08-11 DIAGNOSIS — M719 Bursopathy, unspecified: Secondary | ICD-10-CM | POA: Diagnosis not present

## 2012-08-30 DIAGNOSIS — M25673 Stiffness of unspecified ankle, not elsewhere classified: Secondary | ICD-10-CM | POA: Diagnosis not present

## 2012-08-30 DIAGNOSIS — M722 Plantar fascial fibromatosis: Secondary | ICD-10-CM | POA: Diagnosis not present

## 2012-08-30 DIAGNOSIS — R262 Difficulty in walking, not elsewhere classified: Secondary | ICD-10-CM | POA: Diagnosis not present

## 2012-08-30 DIAGNOSIS — S8263XA Displaced fracture of lateral malleolus of unspecified fibula, initial encounter for closed fracture: Secondary | ICD-10-CM | POA: Diagnosis not present

## 2012-09-08 DIAGNOSIS — R262 Difficulty in walking, not elsewhere classified: Secondary | ICD-10-CM | POA: Diagnosis not present

## 2012-09-08 DIAGNOSIS — M722 Plantar fascial fibromatosis: Secondary | ICD-10-CM | POA: Diagnosis not present

## 2012-09-08 DIAGNOSIS — M25673 Stiffness of unspecified ankle, not elsewhere classified: Secondary | ICD-10-CM | POA: Diagnosis not present

## 2012-09-08 DIAGNOSIS — S8263XA Displaced fracture of lateral malleolus of unspecified fibula, initial encounter for closed fracture: Secondary | ICD-10-CM | POA: Diagnosis not present

## 2012-09-13 DIAGNOSIS — S8263XA Displaced fracture of lateral malleolus of unspecified fibula, initial encounter for closed fracture: Secondary | ICD-10-CM | POA: Diagnosis not present

## 2012-09-13 DIAGNOSIS — M25673 Stiffness of unspecified ankle, not elsewhere classified: Secondary | ICD-10-CM | POA: Diagnosis not present

## 2012-09-13 DIAGNOSIS — R262 Difficulty in walking, not elsewhere classified: Secondary | ICD-10-CM | POA: Diagnosis not present

## 2012-09-13 DIAGNOSIS — M722 Plantar fascial fibromatosis: Secondary | ICD-10-CM | POA: Diagnosis not present

## 2012-09-20 DIAGNOSIS — M25673 Stiffness of unspecified ankle, not elsewhere classified: Secondary | ICD-10-CM | POA: Diagnosis not present

## 2012-09-20 DIAGNOSIS — S8263XA Displaced fracture of lateral malleolus of unspecified fibula, initial encounter for closed fracture: Secondary | ICD-10-CM | POA: Diagnosis not present

## 2012-09-20 DIAGNOSIS — M722 Plantar fascial fibromatosis: Secondary | ICD-10-CM | POA: Diagnosis not present

## 2012-09-20 DIAGNOSIS — R262 Difficulty in walking, not elsewhere classified: Secondary | ICD-10-CM | POA: Diagnosis not present

## 2012-09-22 DIAGNOSIS — M722 Plantar fascial fibromatosis: Secondary | ICD-10-CM | POA: Diagnosis not present

## 2012-09-22 DIAGNOSIS — S8263XA Displaced fracture of lateral malleolus of unspecified fibula, initial encounter for closed fracture: Secondary | ICD-10-CM | POA: Diagnosis not present

## 2012-09-22 DIAGNOSIS — R262 Difficulty in walking, not elsewhere classified: Secondary | ICD-10-CM | POA: Diagnosis not present

## 2012-09-22 DIAGNOSIS — M25673 Stiffness of unspecified ankle, not elsewhere classified: Secondary | ICD-10-CM | POA: Diagnosis not present

## 2012-10-06 DIAGNOSIS — R262 Difficulty in walking, not elsewhere classified: Secondary | ICD-10-CM | POA: Diagnosis not present

## 2012-10-06 DIAGNOSIS — M722 Plantar fascial fibromatosis: Secondary | ICD-10-CM | POA: Diagnosis not present

## 2012-10-06 DIAGNOSIS — S8263XA Displaced fracture of lateral malleolus of unspecified fibula, initial encounter for closed fracture: Secondary | ICD-10-CM | POA: Diagnosis not present

## 2012-10-06 DIAGNOSIS — M25673 Stiffness of unspecified ankle, not elsewhere classified: Secondary | ICD-10-CM | POA: Diagnosis not present

## 2012-10-18 DIAGNOSIS — R262 Difficulty in walking, not elsewhere classified: Secondary | ICD-10-CM | POA: Diagnosis not present

## 2012-10-18 DIAGNOSIS — S43419A Sprain of unspecified coracohumeral (ligament), initial encounter: Secondary | ICD-10-CM | POA: Diagnosis not present

## 2012-10-18 DIAGNOSIS — M25673 Stiffness of unspecified ankle, not elsewhere classified: Secondary | ICD-10-CM | POA: Diagnosis not present

## 2012-10-18 DIAGNOSIS — M722 Plantar fascial fibromatosis: Secondary | ICD-10-CM | POA: Diagnosis not present

## 2013-01-17 ENCOUNTER — Ambulatory Visit: Payer: BC Managed Care – PPO | Admitting: Internal Medicine

## 2013-01-19 ENCOUNTER — Ambulatory Visit (INDEPENDENT_AMBULATORY_CARE_PROVIDER_SITE_OTHER): Payer: Medicare Other | Admitting: Internal Medicine

## 2013-01-19 ENCOUNTER — Encounter: Payer: Self-pay | Admitting: Internal Medicine

## 2013-01-19 VITALS — BP 144/68 | HR 88 | Temp 98.2°F | Ht 60.0 in | Wt 179.0 lb

## 2013-01-19 DIAGNOSIS — S82899A Other fracture of unspecified lower leg, initial encounter for closed fracture: Secondary | ICD-10-CM | POA: Insufficient documentation

## 2013-01-19 DIAGNOSIS — M899 Disorder of bone, unspecified: Secondary | ICD-10-CM

## 2013-01-19 DIAGNOSIS — M949 Disorder of cartilage, unspecified: Secondary | ICD-10-CM

## 2013-01-19 DIAGNOSIS — Z794 Long term (current) use of insulin: Secondary | ICD-10-CM

## 2013-01-19 DIAGNOSIS — IMO0001 Reserved for inherently not codable concepts without codable children: Secondary | ICD-10-CM | POA: Diagnosis not present

## 2013-01-19 DIAGNOSIS — Z23 Encounter for immunization: Secondary | ICD-10-CM | POA: Diagnosis not present

## 2013-01-19 DIAGNOSIS — I10 Essential (primary) hypertension: Secondary | ICD-10-CM

## 2013-01-19 DIAGNOSIS — E1165 Type 2 diabetes mellitus with hyperglycemia: Secondary | ICD-10-CM

## 2013-01-19 DIAGNOSIS — S8290XS Unspecified fracture of unspecified lower leg, sequela: Secondary | ICD-10-CM

## 2013-01-19 DIAGNOSIS — M8580 Other specified disorders of bone density and structure, unspecified site: Secondary | ICD-10-CM

## 2013-01-19 DIAGNOSIS — S82899S Other fracture of unspecified lower leg, sequela: Secondary | ICD-10-CM

## 2013-01-19 DIAGNOSIS — E785 Hyperlipidemia, unspecified: Secondary | ICD-10-CM

## 2013-01-19 DIAGNOSIS — IMO0002 Reserved for concepts with insufficient information to code with codable children: Secondary | ICD-10-CM

## 2013-01-19 MED ORDER — AMLODIPINE BESYLATE 2.5 MG PO TABS: 2.5000 mg | ORAL_TABLET | Freq: Every day | ORAL | Status: DC

## 2013-01-19 MED ORDER — INSULIN ASPART 100 UNIT/ML FLEXPEN
PEN_INJECTOR | SUBCUTANEOUS | Status: DC
Start: 2013-01-19 — End: 2013-01-23

## 2013-01-19 MED ORDER — INSULIN PEN NEEDLE 31G X 8 MM MISC
Status: DC
Start: 2013-01-19 — End: 2013-01-23

## 2013-01-19 MED ORDER — INSULIN GLARGINE 100 UNIT/ML SOLOSTAR PEN: 10.0000 [IU] | PEN_INJECTOR | Freq: Every day | SUBCUTANEOUS | Status: DC

## 2013-01-19 NOTE — Assessment & Plan Note (Signed)
Patient has been diabetic for approximately 20 years. She started insulin therapy 5 years ago. She is currently using regular insulin 3 times a day. She is having intermittent hypoglycemic episodes. I suggest we switch to NovoLog and reduce dose to 15 units for light meals and 20 units for lunch and dinner. Refer to diabetic educator for further insulin teaching. Add Lantus 10 units at bedtime.  Consider restarting Victoza for appetite control and weight loss.

## 2013-01-19 NOTE — Assessment & Plan Note (Signed)
Continue same dose of atorvastatin. Monitor lipid panel and LFTs. 

## 2013-01-19 NOTE — Assessment & Plan Note (Addendum)
Patient has history of rgiht ankle fracture requiring plate and screws in March of 2014. She has never had bone density scan. Arrange screening DEXA.

## 2013-01-19 NOTE — Progress Notes (Signed)
Pre visit review using our clinic review tool, if applicable. No additional management support is needed unless otherwise documented below in the visit note. 

## 2013-01-19 NOTE — Patient Instructions (Signed)
Bring your glucometer with you to your next follow up visit. Please complete the following lab tests before your next follow up appointment: BMET, A1c, Microalb/Cr ratio - 250.02 FLP, LFTs, TSH - 272.4 CBCD - 401.9

## 2013-01-19 NOTE — Assessment & Plan Note (Signed)
I stressed importance of achieving blood pressure goals. Continue valsartan hydrochlorothiazide 320/25 mg once daily. Add amlodipine 2.5 mg once daily. Monitor electrolytes and kidney function. Check for microalbumin. BP: 144/68 mmHg

## 2013-01-19 NOTE — Progress Notes (Addendum)
Subjective:    Patient ID: Sarah [name], female    DOB: December 03, 1945, 68 y.o.   MRN: [id number]  HPI  68 year old African American female with history of type 2 diabetes, hypertension and hyperlipidemia to establish. Patient's previous primary care physician was Dr. Brigitte Pulse. Patient reports she has been diabetic for over 20 years. She started insulin therapy approximately 5 years ago. Her A1c has been suboptimally controlled. She denies any microvascular complications of type 2 diabetes. She denies any cardiac history other than history of heart murmur.  She is currently taking regular insulin 22 units in the morning and 24 units before lunch and dinner. She intermittently has hypoglycemic episodes especially when she eats a light breakfast.  She currently takes valsartan hydrochlorothiazide 320/25 mg once daily for hypertension. She does not monitor her blood pressure at home.  Hyperlipidemia - she takes atorvastatin 40 mg once daily.  No adverse effects reported.  Chart review performed - she suffered right ankle fracture in March of 2014 after suffering fall. She has not been screened for osteoporosis in the past. Review of Systems   Constitutional: Negative for activity change, appetite change and unexpected weight change.  Eyes: Negative for visual disturbance.  followed by opthalmologist Respiratory: Negative for cough, chest tightness and shortness of breath.   Cardiovascular: Negative for chest pain.  Genitourinary: Negative for difficulty urinating.  Neurological: Negative for headaches.  Gastrointestinal: Negative for abdominal pain, heartburn melena or hematochezia Psych: Negative for depression or anxiety Endo:  No polyuria or polydypsia All other systems negative.        Past Medical History  Diagnosis Date  . Heart murmur   . Hyperlipidemia   . Diabetes mellitus     takes Diovan for her kidneys  . Boils   . Abscess   . HTN (hypertension)   . Hx of adenomatous  colonic polyps   . Glaucoma   . Osteoporosis   . Pneumonia     History   Social History  . Marital Status: Single    Spouse Name: N/A    Number of Children: N/A  . Years of Education: N/A   Occupational History  . Not on file.   Social History Main Topics  . Smoking status: Current Some Day Smoker  . Smokeless tobacco: Never Used     Comment: smokes occ.   Marland Kitchen Alcohol Use: Yes  . Drug Use: No  . Sexual Activity: Not on file   Other Topics Concern  . Not on file   Social History Narrative  . No narrative on file    Past Surgical History  Procedure Laterality Date  . Colonoscopy    . Polypectomy    . Abdominal hysterectomy    . Tonsillectomy    . Carpal tunnel release      right  . Knee arthroscopy      right  . Orif ankle fracture Right 03/24/2012    Procedure: OPEN REDUCTION INTERNAL FIXATION (ORIF) ANKLE FRACTURE;  Surgeon: Newt Minion, MD;  Location: [facility];  Service: Orthopedics;  Laterality: Right;  Open Reduction Internal Fixation Right Bimalleolar ankle fracture    Family History  Problem Relation Age of Onset  . Colon cancer Neg Hx   . Esophageal cancer Neg Hx   . Rectal cancer Neg Hx   . Stomach cancer Neg Hx   . Diabetes Mother   . Hypertension Mother     No Known Allergies  Current Outpatient Prescriptions on File Prior to Visit  Medication Sig Dispense Refill  . aspirin EC 81 MG tablet Take 81 mg by mouth daily.      Marland Kitchen atorvastatin (LIPITOR) 40 MG tablet Take 40 mg by mouth daily.       Marland Kitchen HYDROcodone-acetaminophen (NORCO/VICODIN) 5-325 MG per tablet Take 1 tablet by mouth every 6 (six) hours as needed for pain.  10 tablet  0  . oxyCODONE-acetaminophen (ROXICET) 5-325 MG per tablet Take 1 tablet by mouth every 4 (four) hours as needed for pain.  60 tablet  0  . valsartan-hydrochlorothiazide (DIOVAN-HCT) 320-25 MG per tablet Take 1 tablet by mouth daily.        . Vitamin D, Ergocalciferol, (DRISDOL) 50000 UNITS CAPS Take 1 capsule by mouth 2  (two) times a week. Patient uses this medication on Monday, and Thursday.       No current facility-administered medications on file prior to visit.    BP 144/68  Pulse 88  Temp(Src) 98.2 F (36.8 C) (Oral)  Ht 5' (1.524 m)  Wt 179 lb (81.194 kg)  BMI 34.96 kg/m2    Objective:   Physical Exam  Constitutional: She is oriented to person, place, and time. She appears well-developed and well-nourished. No distress.  HENT:  Head: Normocephalic and atraumatic.  Right Ear: External ear normal.  Left Ear: External ear normal.  Mouth/Throat: Oropharynx is clear and moist.  Poor dentition  Eyes: Conjunctivae and EOM are normal. Pupils are equal, round, and reactive to light. No scleral icterus.  Neck: Neck supple.  No carotid bruit  Cardiovascular: Normal rate, regular rhythm and normal heart sounds.   Faint systolic ejection murmur left sternal border (2/6)  Pulmonary/Chest: Effort normal and breath sounds normal. She has no wheezes.  Abdominal: Soft. Bowel sounds are normal. She exhibits no distension and no mass. There is no tenderness.  Abdominal obesity  Musculoskeletal: She exhibits no edema.  Lymphadenopathy:    She has no cervical adenopathy.  Neurological: She is alert and oriented to person, place, and time. No cranial nerve deficit.  Skin: Skin is warm and dry.  Psychiatric: She has a normal mood and affect. Her behavior is normal.          Assessment & Plan:

## 2013-01-23 ENCOUNTER — Telehealth: Payer: Self-pay | Admitting: Internal Medicine

## 2013-01-23 ENCOUNTER — Other Ambulatory Visit: Payer: Self-pay | Admitting: *Deleted

## 2013-01-23 MED ORDER — AMLODIPINE BESYLATE 2.5 MG PO TABS: 2.5000 mg | ORAL_TABLET | Freq: Every day | ORAL | Status: DC

## 2013-01-23 MED ORDER — INSULIN GLARGINE 100 UNIT/ML SOLOSTAR PEN: 10.0000 [IU] | PEN_INJECTOR | Freq: Every day | SUBCUTANEOUS | Status: DC

## 2013-01-23 MED ORDER — METFORMIN HCL 1000 MG PO TABS: 1000.0000 mg | ORAL_TABLET | Freq: Two times a day (BID) | ORAL | Status: DC

## 2013-01-23 MED ORDER — INSULIN ASPART 100 UNIT/ML FLEXPEN: PEN_INJECTOR | SUBCUTANEOUS | Status: DC

## 2013-01-23 MED ORDER — VITAMIN D (ERGOCALCIFEROL) 1.25 MG (50000 UNIT) PO CAPS: 50000.0000 [IU] | ORAL_CAPSULE | ORAL | Status: DC

## 2013-01-23 MED ORDER — VALSARTAN-HYDROCHLOROTHIAZIDE 320-25 MG PO TABS: 1.0000 | ORAL_TABLET | Freq: Every day | ORAL | Status: DC

## 2013-01-23 MED ORDER — INSULIN PEN NEEDLE 31G X 8 MM MISC: Status: DC

## 2013-01-23 NOTE — Telephone Encounter (Signed)
Left message for pt to call our office to schedule a bone density scan.

## 2013-01-25 ENCOUNTER — Telehealth: Payer: Self-pay

## 2013-01-25 NOTE — Telephone Encounter (Signed)
Relevant patient education assigned to patient using Emmi. ° °

## 2013-01-26 DIAGNOSIS — Z1231 Encounter for screening mammogram for malignant neoplasm of breast: Secondary | ICD-10-CM | POA: Diagnosis not present

## 2013-01-26 DIAGNOSIS — Z803 Family history of malignant neoplasm of breast: Secondary | ICD-10-CM | POA: Diagnosis not present

## 2013-02-07 ENCOUNTER — Inpatient Hospital Stay: Admission: RE | Admit: 2013-02-07 | Payer: Medicare Other | Source: Ambulatory Visit

## 2013-02-07 ENCOUNTER — Other Ambulatory Visit: Payer: Medicare Other

## 2013-02-08 ENCOUNTER — Ambulatory Visit (INDEPENDENT_AMBULATORY_CARE_PROVIDER_SITE_OTHER)
Admission: RE | Admit: 2013-02-08 | Discharge: 2013-02-08 | Disposition: A | Discharge status: Home / Self Care | Payer: Medicare Other | Source: Ambulatory Visit | Attending: Internal Medicine | Admitting: Internal Medicine

## 2013-02-08 DIAGNOSIS — M899 Disorder of bone, unspecified: Secondary | ICD-10-CM | POA: Diagnosis not present

## 2013-02-08 DIAGNOSIS — M949 Disorder of cartilage, unspecified: Secondary | ICD-10-CM

## 2013-02-08 DIAGNOSIS — M8580 Other specified disorders of bone density and structure, unspecified site: Secondary | ICD-10-CM

## 2013-02-09 ENCOUNTER — Other Ambulatory Visit (INDEPENDENT_AMBULATORY_CARE_PROVIDER_SITE_OTHER): Payer: Medicare Other

## 2013-02-09 DIAGNOSIS — I10 Essential (primary) hypertension: Secondary | ICD-10-CM

## 2013-02-09 DIAGNOSIS — IMO0001 Reserved for inherently not codable concepts without codable children: Secondary | ICD-10-CM | POA: Diagnosis not present

## 2013-02-09 DIAGNOSIS — E785 Hyperlipidemia, unspecified: Secondary | ICD-10-CM

## 2013-02-09 DIAGNOSIS — E1165 Type 2 diabetes mellitus with hyperglycemia: Principal | ICD-10-CM

## 2013-02-09 LAB — LIPID PANEL
Cholesterol: 116 mg/dL (ref 0–200)
HDL: 42.9 mg/dL (ref >39.00)
LDL Cholesterol: 48 mg/dL (ref 0–99)
Total CHOL/HDL Ratio: 3
Triglycerides: 125 mg/dL (ref 0.0–149.0)
VLDL: 25 mg/dL (ref 0.0–40.0)

## 2013-02-09 LAB — HEPATIC FUNCTION PANEL
ALT: 13 U/L (ref 0–35)
AST: 15 U/L (ref 0–37)
Albumin: 4.1 g/dL (ref 3.5–5.2)
Alkaline Phosphatase: 100 U/L (ref 39–117)
Bilirubin, Direct: 0.1 mg/dL (ref 0.0–0.3)
Total Bilirubin: 0.6 mg/dL (ref 0.3–1.2)
Total Protein: 7.4 g/dL (ref 6.0–8.3)

## 2013-02-09 LAB — CBC WITH DIFFERENTIAL/PLATELET
Basophils Absolute: 0 10*3/uL (ref 0.0–0.1)
Basophils Relative: 0.7 % (ref 0.0–3.0)
Eosinophils Absolute: 0.1 10*3/uL (ref 0.0–0.7)
Eosinophils Relative: 1.4 % (ref 0.0–5.0)
HCT: 40.1 % (ref 36.0–46.0)
Hemoglobin: 13.2 g/dL (ref 12.0–15.0)
Lymphocytes Relative: 41.4 % (ref 12.0–46.0)
Lymphs Abs: 2.7 10*3/uL (ref 0.7–4.0)
MCHC: 32.9 g/dL (ref 30.0–36.0)
MCV: 95.4 fl (ref 78.0–100.0)
Monocytes Absolute: 0.3 10*3/uL (ref 0.1–1.0)
Monocytes Relative: 5.2 % (ref 3.0–12.0)
Neutro Abs: 3.3 10*3/uL (ref 1.4–7.7)
Neutrophils Relative %: 51.3 % (ref 43.0–77.0)
Platelets: 351 10*3/uL (ref 150.0–400.0)
RBC: 4.2 Mil/uL (ref 3.87–5.11)
RDW: 13.5 % (ref 11.5–14.6)
WBC: 6.5 10*3/uL (ref 4.5–10.5)

## 2013-02-09 LAB — BASIC METABOLIC PANEL
BUN: 14 mg/dL (ref 6–23)
CO2: 27 mEq/L (ref 19–32)
Calcium: 10.2 mg/dL (ref 8.4–10.5)
Chloride: 106 mEq/L (ref 96–112)
Creatinine, Ser: 0.6 mg/dL (ref 0.4–1.2)
GFR: 131.34 mL/min (ref >60.00)
Glucose, Bld: 210 mg/dL — ABNORMAL HIGH (ref 70–99)
Potassium: 3.6 mEq/L (ref 3.5–5.1)
Sodium: 139 mEq/L (ref 135–145)

## 2013-02-09 LAB — TSH: TSH: 1.64 u[IU]/mL (ref 0.35–5.50)

## 2013-02-09 LAB — MICROALBUMIN / CREATININE URINE RATIO
Creatinine,U: 184.7 mg/dL
Microalb Creat Ratio: 7 mg/g (ref 0.0–30.0)
Microalb, Ur: 12.9 mg/dL — ABNORMAL HIGH (ref 0.0–1.9)

## 2013-02-09 LAB — HEMOGLOBIN A1C: Hgb A1c MFr Bld: 10.8 % — ABNORMAL HIGH (ref 4.6–6.5)

## 2013-02-16 ENCOUNTER — Encounter: Payer: Self-pay | Admitting: Internal Medicine

## 2013-02-16 ENCOUNTER — Ambulatory Visit (INDEPENDENT_AMBULATORY_CARE_PROVIDER_SITE_OTHER): Payer: Medicare Other | Admitting: Internal Medicine

## 2013-02-16 VITALS — BP 152/82 | HR 96 | Temp 98.4°F | Ht 60.0 in | Wt 176.0 lb

## 2013-02-16 DIAGNOSIS — I10 Essential (primary) hypertension: Secondary | ICD-10-CM | POA: Diagnosis not present

## 2013-02-16 DIAGNOSIS — M949 Disorder of cartilage, unspecified: Secondary | ICD-10-CM | POA: Diagnosis not present

## 2013-02-16 DIAGNOSIS — IMO0001 Reserved for inherently not codable concepts without codable children: Secondary | ICD-10-CM | POA: Diagnosis not present

## 2013-02-16 DIAGNOSIS — M899 Disorder of bone, unspecified: Secondary | ICD-10-CM | POA: Diagnosis not present

## 2013-02-16 DIAGNOSIS — Z794 Long term (current) use of insulin: Secondary | ICD-10-CM | POA: Diagnosis not present

## 2013-02-16 DIAGNOSIS — M8580 Other specified disorders of bone density and structure, unspecified site: Secondary | ICD-10-CM

## 2013-02-16 DIAGNOSIS — IMO0002 Reserved for concepts with insufficient information to code with codable children: Secondary | ICD-10-CM

## 2013-02-16 DIAGNOSIS — E1165 Type 2 diabetes mellitus with hyperglycemia: Secondary | ICD-10-CM

## 2013-02-16 MED ORDER — INSULIN GLARGINE 100 UNIT/ML SOLOSTAR PEN: 15.0000 [IU] | PEN_INJECTOR | Freq: Every day | SUBCUTANEOUS | Status: DC

## 2013-02-16 NOTE — Progress Notes (Signed)
Pre visit review using our clinic review tool, if applicable. No additional management support is needed unless otherwise documented below in the visit note. 

## 2013-02-16 NOTE — Assessment & Plan Note (Signed)
Patient's blood pressures sporadically elevated today. She did not take her blood pressure medication today. I stressed importance of medication compliance. BP: 152/82 mmHg

## 2013-02-16 NOTE — Assessment & Plan Note (Signed)
Labile blood sugars improving. Her morning blood sugars are still greater than 150. Titrate Lantus to 15 units at bedtime. Patient strongly encouraged to eat and take her insulin on a regular schedule. Patient understands to decrease her mealtime insulin by half if she plans to exercise afterwards.  Bring blood sugar log to next appointment. Lab Results  Component Value Date   HGBA1C 10.8* 02/09/2013

## 2013-02-16 NOTE — Progress Notes (Signed)
Subjective:    Patient ID: Sarah [name], female    DOB: January 20, 1945, 68 y.o.   MRN: [id number]  HPI  68 year old African American female with history of uncontrolled type 2 diabetes and hypertension for followup. Patient reports her blood sugars less labile. She does report one episode of hypoglycemia were her blood sugar was 48. This occured after exercising on treadmill.  Patient does not eat on a regular schedule.  Hypertension-she did not take her blood pressure medication today.  Lab Results  Component Value Date   HGBA1C 10.8* 02/09/2013      Review of Systems Negative for chest pain    Past Medical History  Diagnosis Date  . Heart murmur   . Hyperlipidemia   . Diabetes mellitus     takes Diovan for her kidneys  . Boils   . Abscess   . HTN (hypertension)   . Hx of adenomatous colonic polyps   . Glaucoma   . Osteoporosis   . Pneumonia     History   Social History  . Marital Status: Single    Spouse Name: N/A    Number of Children: N/A  . Years of Education: N/A   Occupational History  . Not on file.   Social History Main Topics  . Smoking status: Current Some Day Smoker  . Smokeless tobacco: Never Used     Comment: smokes occ.   Marland Kitchen Alcohol Use: Yes  . Drug Use: No  . Sexual Activity: Not on file   Other Topics Concern  . Not on file   Social History Narrative  . No narrative on file    Past Surgical History  Procedure Laterality Date  . Colonoscopy    . Polypectomy    . Abdominal hysterectomy    . Tonsillectomy    . Carpal tunnel release      right  . Knee arthroscopy      right  . Orif ankle fracture Right 03/24/2012    Procedure: OPEN REDUCTION INTERNAL FIXATION (ORIF) ANKLE FRACTURE;  Surgeon: Newt Minion, MD;  Location: [facility];  Service: Orthopedics;  Laterality: Right;  Open Reduction Internal Fixation Right Bimalleolar ankle fracture    Family History  Problem Relation Age of Onset  . Colon cancer Neg Hx   . Esophageal  cancer Neg Hx   . Rectal cancer Neg Hx   . Stomach cancer Neg Hx   . Diabetes Mother   . Hypertension Mother     No Known Allergies  Current Outpatient Prescriptions on File Prior to Visit  Medication Sig Dispense Refill  . amLODipine (NORVASC) 2.5 MG tablet Take 1 tablet (2.5 mg total) by mouth daily.  30 tablet  3  . aspirin EC 81 MG tablet Take 81 mg by mouth daily.      Marland Kitchen atorvastatin (LIPITOR) 40 MG tablet Take 40 mg by mouth daily.       Marland Kitchen HYDROcodone-acetaminophen (NORCO/VICODIN) 5-325 MG per tablet Take 1 tablet by mouth every 6 (six) hours as needed for pain.  10 tablet  0  . insulin aspart (NOVOLOG) 100 UNIT/ML FlexPen Use 15-20 units 15 mins before meals 3 x daily  15 mL  3  . Insulin Pen Needle (CLICKFINE PEN NEEDLES) 31G X 8 MM MISC Use 4 x daily as directed  120 each  11  . metFORMIN (GLUCOPHAGE) 1000 MG tablet Take 1 tablet (1,000 mg total) by mouth 2 (two) times daily with a meal.  180 tablet  1  .  oxyCODONE-acetaminophen (ROXICET) 5-325 MG per tablet Take 1 tablet by mouth every 4 (four) hours as needed for pain.  60 tablet  0  . valsartan-hydrochlorothiazide (DIOVAN-HCT) 320-25 MG per tablet Take 1 tablet by mouth daily.  30 tablet  5  . Vitamin D, Ergocalciferol, (DRISDOL) 50000 UNITS CAPS capsule Take 1 capsule (50,000 Units total) by mouth 2 (two) times a week. Patient uses this medication on Monday, and Thursday.  8 capsule  2   No current facility-administered medications on file prior to visit.    BP 152/82  Pulse 96  Temp(Src) 98.4 F (36.9 C) (Oral)  Ht 5' (1.524 m)  Wt 176 lb (79.833 kg)  BMI 34.37 kg/m2    Objective:   Physical Exam  Constitutional: She is oriented to person, place, and time. She appears well-developed and well-nourished. No distress.  HENT:  Head: Normocephalic and atraumatic.  Neck: Neck supple.  Cardiovascular: Normal rate, regular rhythm and normal heart sounds.   Pulmonary/Chest: Effort normal and breath sounds normal. She  has no wheezes.  Musculoskeletal: She exhibits no edema.  Neurological: She is alert and oriented to person, place, and time.  Skin: Skin is warm and dry.  Psychiatric: She has a normal mood and affect. Her behavior is normal.          Assessment & Plan:

## 2013-02-16 NOTE — Patient Instructions (Signed)
Please bring your blood sugar log to your next follow up appointment

## 2013-02-16 NOTE — Assessment & Plan Note (Signed)
Bone density showed osteopenia. Continue vitamin D supplementation and weight bearing exercises.

## 2013-02-19 ENCOUNTER — Telehealth: Payer: Self-pay | Admitting: Internal Medicine

## 2013-02-19 NOTE — Telephone Encounter (Signed)
Relevant patient education assigned to patient using Emmi. ° °

## 2013-02-19 NOTE — Telephone Encounter (Signed)
Relevant patient education mailed to patient.  

## 2013-02-22 ENCOUNTER — Encounter: Payer: Self-pay | Admitting: Internal Medicine

## 2013-02-28 ENCOUNTER — Ambulatory Visit: Payer: Medicare Other

## 2013-03-07 ENCOUNTER — Ambulatory Visit: Payer: Medicare Other

## 2013-03-14 ENCOUNTER — Ambulatory Visit: Payer: Medicare Other

## 2013-03-16 ENCOUNTER — Ambulatory Visit: Payer: Medicare Other

## 2013-03-16 ENCOUNTER — Ambulatory Visit: Payer: Medicare Other | Admitting: Internal Medicine

## 2013-03-23 ENCOUNTER — Ambulatory Visit (INDEPENDENT_AMBULATORY_CARE_PROVIDER_SITE_OTHER): Payer: Medicare Other | Admitting: Internal Medicine

## 2013-03-23 ENCOUNTER — Ambulatory Visit: Payer: Medicare Other

## 2013-03-23 ENCOUNTER — Encounter: Payer: Self-pay | Admitting: Internal Medicine

## 2013-03-23 VITALS — BP 150/78 | HR 92 | Temp 98.1°F | Ht 60.0 in | Wt 171.0 lb

## 2013-03-23 DIAGNOSIS — Z794 Long term (current) use of insulin: Secondary | ICD-10-CM

## 2013-03-23 DIAGNOSIS — B3731 Acute candidiasis of vulva and vagina: Secondary | ICD-10-CM | POA: Diagnosis not present

## 2013-03-23 DIAGNOSIS — IMO0001 Reserved for inherently not codable concepts without codable children: Secondary | ICD-10-CM

## 2013-03-23 DIAGNOSIS — B373 Candidiasis of vulva and vagina: Secondary | ICD-10-CM | POA: Diagnosis not present

## 2013-03-23 DIAGNOSIS — E1165 Type 2 diabetes mellitus with hyperglycemia: Secondary | ICD-10-CM

## 2013-03-23 DIAGNOSIS — IMO0002 Reserved for concepts with insufficient information to code with codable children: Secondary | ICD-10-CM

## 2013-03-23 MED ORDER — AMLODIPINE BESYLATE 5 MG PO TABS: 5.0000 mg | ORAL_TABLET | Freq: Every day | ORAL | Status: DC

## 2013-03-23 MED ORDER — ATORVASTATIN CALCIUM 20 MG PO TABS: 20.0000 mg | ORAL_TABLET | Freq: Every day | ORAL | Status: DC

## 2013-03-23 MED ORDER — ATORVASTATIN CALCIUM 20 MG PO TABS: 40.0000 mg | ORAL_TABLET | Freq: Every day | ORAL | Status: DC

## 2013-03-23 MED ORDER — MICONAZOLE NITRATE 200 MG VA SUPP: 200.0000 mg | Freq: Every day | VAGINAL | Status: DC

## 2013-03-23 NOTE — Assessment & Plan Note (Signed)
Patient reports signs and symptoms consistent with probable vaginal yeast infection. Treat with Monistat vaginal suppositories for 3 days.

## 2013-03-23 NOTE — Progress Notes (Signed)
Pre visit review using our clinic review tool, if applicable. No additional management support is needed unless otherwise documented below in the visit note. 

## 2013-03-23 NOTE — Patient Instructions (Signed)
Please complete the following lab tests before your next follow up appointment: BMET, A1c - 401.9, 250.02

## 2013-03-23 NOTE — Addendum Note (Signed)
Addended by: Alyson Ingles R on: 03/23/2013 10:20 PM   Modules accepted: Level of Service

## 2013-03-23 NOTE — Assessment & Plan Note (Signed)
Compliance with diabetic diet and regular insulin use encouraged.

## 2013-03-23 NOTE — Progress Notes (Signed)
Subjective:    Patient ID: Sarah [name], female    DOB: 05/12/1945, 68 y.o.   MRN: [id number]  HPI  68 year old African American female with uncontrolled type 2 diabetes, hypertension and hyperlipidemia for followup. Patient reports poor compliance with her mealtime insulin. Patient attributes poor compliance from increased stress from taking care of her elderly parents.  She complains of possible vaginal yeast infection. She describes whitish discharge and vaginal pruritus. This has occurred in the past with higher blood sugars.  Hypertension-stable  Review of Systems Negative for dysuria.  No fever or chills    Past Medical History  Diagnosis Date  . Heart murmur   . Hyperlipidemia   . Diabetes mellitus     takes Diovan for her kidneys  . Boils   . Abscess   . HTN (hypertension)   . Hx of adenomatous colonic polyps   . Glaucoma   . Osteoporosis   . Pneumonia     History   Social History  . Marital Status: Single    Spouse Name: N/A    Number of Children: N/A  . Years of Education: N/A   Occupational History  . Not on file.   Social History Main Topics  . Smoking status: Current Some Day Smoker  . Smokeless tobacco: Never Used     Comment: smokes occ.   Marland Kitchen Alcohol Use: Yes  . Drug Use: No  . Sexual Activity: Not on file   Other Topics Concern  . Not on file   Social History Narrative  . No narrative on file    Past Surgical History  Procedure Laterality Date  . Colonoscopy    . Polypectomy    . Abdominal hysterectomy    . Tonsillectomy    . Carpal tunnel release      right  . Knee arthroscopy      right  . Orif ankle fracture Right 03/24/2012    Procedure: OPEN REDUCTION INTERNAL FIXATION (ORIF) ANKLE FRACTURE;  Surgeon: Newt Minion, MD;  Location: [place];  Service: Orthopedics;  Laterality: Right;  Open Reduction Internal Fixation Right Bimalleolar ankle fracture    Family History  Problem Relation Age of Onset  . Colon cancer Neg Hx     . Esophageal cancer Neg Hx   . Rectal cancer Neg Hx   . Stomach cancer Neg Hx   . Diabetes Mother   . Hypertension Mother     No Known Allergies  Current Outpatient Prescriptions on File Prior to Visit  Medication Sig Dispense Refill  . aspirin EC 81 MG tablet Take 81 mg by mouth daily.      . insulin aspart (NOVOLOG) 100 UNIT/ML FlexPen Use 15-20 units 15 mins before meals 3 x daily  15 mL  3  . Insulin Glargine (LANTUS SOLOSTAR) 100 UNIT/ML Solostar Pen Inject 15 Units into the skin daily at 10 pm.  5 pen  3  . Insulin Pen Needle (CLICKFINE PEN NEEDLES) 31G X 8 MM MISC Use 4 x daily as directed  120 each  11  . metFORMIN (GLUCOPHAGE) 1000 MG tablet Take 1 tablet (1,000 mg total) by mouth 2 (two) times daily with a meal.  180 tablet  1  . valsartan-hydrochlorothiazide (DIOVAN-HCT) 320-25 MG per tablet Take 1 tablet by mouth daily.  30 tablet  5  . Vitamin D, Ergocalciferol, (DRISDOL) 50000 UNITS CAPS capsule Take 1 capsule (50,000 Units total) by mouth 2 (two) times a week. Patient uses this medication on Monday,  and Thursday.  8 capsule  2  . HYDROcodone-acetaminophen (NORCO/VICODIN) 5-325 MG per tablet Take 1 tablet by mouth every 6 (six) hours as needed for pain.  10 tablet  0  . oxyCODONE-acetaminophen (ROXICET) 5-325 MG per tablet Take 1 tablet by mouth every 4 (four) hours as needed for pain.  60 tablet  0   No current facility-administered medications on file prior to visit.    BP 150/78  Pulse 92  Temp(Src) 98.1 F (36.7 C) (Oral)  Ht 5' (1.524 m)  Wt 171 lb (77.565 kg)  BMI 33.40 kg/m2    Objective:   Physical Exam  Constitutional: She is oriented to person, place, and time. She appears well-developed and well-nourished. No distress.  HENT:  Head: Normocephalic and atraumatic.  Cardiovascular: Normal rate, regular rhythm and normal heart sounds.   No murmur heard. Pulmonary/Chest: Effort normal and breath sounds normal. She has no wheezes.  Musculoskeletal: She  exhibits no edema.  Neurological: She is alert and oriented to person, place, and time. No cranial nerve deficit.  Psychiatric: She has a normal mood and affect. Her behavior is normal.       Assessment & Plan:

## 2013-03-26 ENCOUNTER — Telehealth: Payer: Self-pay | Admitting: Internal Medicine

## 2013-03-26 NOTE — Telephone Encounter (Signed)
Relevant patient education assigned to patient using Emmi. ° °

## 2013-03-27 ENCOUNTER — Encounter: Payer: Medicare Other | Attending: Internal Medicine

## 2013-03-27 VITALS — Ht 60.0 in | Wt 172.4 lb

## 2013-03-27 DIAGNOSIS — Z713 Dietary counseling and surveillance: Secondary | ICD-10-CM | POA: Diagnosis not present

## 2013-03-27 DIAGNOSIS — E119 Type 2 diabetes mellitus without complications: Secondary | ICD-10-CM | POA: Insufficient documentation

## 2013-03-27 DIAGNOSIS — E1165 Type 2 diabetes mellitus with hyperglycemia: Secondary | ICD-10-CM

## 2013-03-27 DIAGNOSIS — IMO0002 Reserved for concepts with insufficient information to code with codable children: Secondary | ICD-10-CM

## 2013-03-27 DIAGNOSIS — Z794 Long term (current) use of insulin: Secondary | ICD-10-CM

## 2013-03-27 NOTE — Progress Notes (Signed)
Patient was seen on 03/27/13 for the first of a series of three diabetes self-management courses at the [facility].  Current HbA1c: 10.0%  The following learning objectives were met by the patient during this class:  Describe diabetes  State some common risk factors for diabetes  Defines the role of glucose and insulin  Identifies type of diabetes and pathophysiology  Describe the relationship between diabetes and cardiovascular risk  State the members of the Healthcare Team  States the rationale for glucose monitoring  State when to test glucose  State their individual Target Range  State the importance of logging glucose readings  Describe how to interpret glucose readings  Identifies A1C target  Explain the correlation between A1c and eAG values  State symptoms and treatment of high blood glucose  State symptoms and treatment of low blood glucose  Explain proper technique for glucose testing  Identifies proper sharps disposal  Handouts given during class include:  Living Well with Diabetes book  Carb Counting and Meal Planning book  Meal Plan Card  Carbohydrate guide  Meal planning worksheet  Low Sodium Flavoring Tips  The diabetes portion plate  A8T to eAG Conversion Chart  Diabetes Medications  Diabetes Recommended Care Schedule  Support Group  Diabetes Success Plan  Core Class Satisfaction Survey  Follow-Up Plan:  Attend core 2

## 2013-03-28 DIAGNOSIS — D2370 Other benign neoplasm of skin of unspecified lower limb, including hip: Secondary | ICD-10-CM | POA: Diagnosis not present

## 2013-03-28 DIAGNOSIS — M65979 Unspecified synovitis and tenosynovitis, unspecified ankle and foot: Secondary | ICD-10-CM | POA: Diagnosis not present

## 2013-03-28 DIAGNOSIS — M2020 Hallux rigidus, unspecified foot: Secondary | ICD-10-CM | POA: Diagnosis not present

## 2013-03-28 DIAGNOSIS — IMO0002 Reserved for concepts with insufficient information to code with codable children: Secondary | ICD-10-CM | POA: Diagnosis not present

## 2013-03-28 DIAGNOSIS — E119 Type 2 diabetes mellitus without complications: Secondary | ICD-10-CM | POA: Diagnosis not present

## 2013-03-28 DIAGNOSIS — M79609 Pain in unspecified limb: Secondary | ICD-10-CM | POA: Diagnosis not present

## 2013-03-28 DIAGNOSIS — L97509 Non-pressure chronic ulcer of other part of unspecified foot with unspecified severity: Secondary | ICD-10-CM | POA: Diagnosis not present

## 2013-03-28 DIAGNOSIS — M659 Synovitis and tenosynovitis, unspecified: Secondary | ICD-10-CM | POA: Diagnosis not present

## 2013-03-28 DIAGNOSIS — B351 Tinea unguium: Secondary | ICD-10-CM | POA: Diagnosis not present

## 2013-03-29 DIAGNOSIS — H2510 Age-related nuclear cataract, unspecified eye: Secondary | ICD-10-CM | POA: Diagnosis not present

## 2013-03-29 DIAGNOSIS — E119 Type 2 diabetes mellitus without complications: Secondary | ICD-10-CM | POA: Diagnosis not present

## 2013-03-30 ENCOUNTER — Ambulatory Visit: Payer: Medicare Other

## 2013-04-03 ENCOUNTER — Ambulatory Visit: Payer: Medicare Other

## 2013-04-16 DIAGNOSIS — D2370 Other benign neoplasm of skin of unspecified lower limb, including hip: Secondary | ICD-10-CM | POA: Diagnosis not present

## 2013-04-23 ENCOUNTER — Encounter: Payer: Self-pay | Admitting: *Deleted

## 2013-04-23 ENCOUNTER — Encounter: Payer: Medicare Other | Attending: Internal Medicine | Admitting: *Deleted

## 2013-04-23 VITALS — Ht 60.0 in | Wt 167.2 lb

## 2013-04-23 DIAGNOSIS — E119 Type 2 diabetes mellitus without complications: Secondary | ICD-10-CM | POA: Insufficient documentation

## 2013-04-23 DIAGNOSIS — Z713 Dietary counseling and surveillance: Secondary | ICD-10-CM | POA: Insufficient documentation

## 2013-04-23 DIAGNOSIS — E1165 Type 2 diabetes mellitus with hyperglycemia: Secondary | ICD-10-CM

## 2013-04-23 DIAGNOSIS — Z794 Long term (current) use of insulin: Secondary | ICD-10-CM

## 2013-04-23 DIAGNOSIS — IMO0002 Reserved for concepts with insufficient information to code with codable children: Secondary | ICD-10-CM

## 2013-04-23 NOTE — Progress Notes (Signed)
  Medical Nutrition Therapy:  Appt start time: 0930 end time:  1100.  Assessment:  Primary concerns today: Patient was seen on  04/23/13 for individual diabetes education. She attended Core 1, but insurance is Medicare so further education will be individual to contain time to [facility] requirements. History of diabetes for aver 20 years.Lives with her mother who is fairly independent. They share the food shopping, her mother cooks most of the meals. Patient used to work from home until January this year. Her Dad is in [place] who she cares for too. She is up and down stairs daily. She SMBG at least twice a day before breakfast and lunch, and occasionally before supper. Reported range usually around 300 in AM, otherwise 250 - 350 mg/dl. She complains of polyuria, polydipsia and fatigue. Current A1c is 10%   Preferred Learning Style:   No preference indicated   Learning Readiness:   Contemplating  Ready  Change in progress  MEDICATIONS:  see list, diabetes medications are Levemir and Novolog   DIETARY INTAKE:  24-hr recall:  B ( AM): coffee with flavored coffee mate, unsweetened cereal with banana OR 2 mini bagels with cream cream cheese OR eggs, bacon or sausage, 1 toast with oleo, usually fruit juice  Snk ( AM): no  L ( PM): sandwich OR left overs OR canned Chef [employer]ardee occasionally, regular soda in bottle Snk ( PM): handful of chips or pretzels OR cottage cheese and fruit D ( PM): meat, starch, vegetables, salad, bread, sweet tea or water Snk ( PM): fresh fruit OR left overs on a piece of bread Beverages: regular soda, water, sweet tea and coffee   Usual physical activity: daily chores and errands   Estimated energy needs: 1500 calories 170 g carbohydrates 112 g protein 42 g fat  Progress Towards Goal(s):  In progress.   Nutritional Diagnosis:  NB-1.1 Food and nutrition-related knowledge deficit As related to diabetes management.  As evidenced by A1c of 10%.     Intervention:  Nutrition counseling and diabetes education initiated. Discussed Carb Counting as method of portion control, reading food labels, and benefits of increased activity. Also discussed action of insulins and encouraged her to take the Levemir at a more consistent time each day to help improve variability of BG.  Plan:  Aim for 3 Carb Choices per meal (45 grams) +/- 1 either way  Aim for 0-2 Carbs per snack if hungry  Include protein in moderation with your meals and snacks Consider reading food labels for Total Carbohydrate of foods Continue checking BG before breakfast and lunch as directed by your MD  Consider taking Levemir insulin medication at consistent time at night as directed by MD   Teaching Method Utilized: Visual, Auditory and Hands on  Handouts given during visit include: Living Well with Diabetes Carb Counting and Food Label handouts Meal Plan Card Menu Planner  Sample Menu for 1500 calories  Barriers to learning/adherence to lifestyle change: frustration with poorly controlled diabetes  Demonstrated degree of understanding via:  Teach Back   Monitoring/Evaluation:  Dietary intake, exercise, reading food labels, and body weight in 1 month(s).

## 2013-04-23 NOTE — Patient Instructions (Signed)
Plan:  Aim for 3 Carb Choices per meal (45 grams) +/- 1 either way  Aim for 0-2 Carbs per snack if hungry  Include protein in moderation with your meals and snacks Consider reading food labels for Total Carbohydrate of foods Continue checking BG before breakfast and lunch as directed by your MD  Consider taking Levemir insulin medication at consistent time at night as directed by MD

## 2013-04-25 ENCOUNTER — Ambulatory Visit: Payer: Medicare Other

## 2013-04-28 ENCOUNTER — Other Ambulatory Visit: Payer: Self-pay | Admitting: Internal Medicine

## 2013-05-03 DIAGNOSIS — B351 Tinea unguium: Secondary | ICD-10-CM | POA: Diagnosis not present

## 2013-05-03 DIAGNOSIS — M79609 Pain in unspecified limb: Secondary | ICD-10-CM | POA: Diagnosis not present

## 2013-05-17 ENCOUNTER — Other Ambulatory Visit: Payer: Self-pay | Admitting: *Deleted

## 2013-05-17 DIAGNOSIS — Z794 Long term (current) use of insulin: Principal | ICD-10-CM

## 2013-05-17 DIAGNOSIS — I10 Essential (primary) hypertension: Secondary | ICD-10-CM

## 2013-05-17 DIAGNOSIS — E1165 Type 2 diabetes mellitus with hyperglycemia: Secondary | ICD-10-CM

## 2013-05-17 DIAGNOSIS — IMO0002 Reserved for concepts with insufficient information to code with codable children: Secondary | ICD-10-CM

## 2013-05-18 ENCOUNTER — Other Ambulatory Visit: Payer: Medicare Other

## 2013-05-23 ENCOUNTER — Ambulatory Visit: Payer: Medicare Other | Admitting: *Deleted

## 2013-05-25 ENCOUNTER — Other Ambulatory Visit (INDEPENDENT_AMBULATORY_CARE_PROVIDER_SITE_OTHER): Payer: Medicare Other

## 2013-05-25 ENCOUNTER — Ambulatory Visit: Payer: Medicare Other | Admitting: Internal Medicine

## 2013-05-25 DIAGNOSIS — Z794 Long term (current) use of insulin: Secondary | ICD-10-CM

## 2013-05-25 DIAGNOSIS — IMO0001 Reserved for inherently not codable concepts without codable children: Secondary | ICD-10-CM

## 2013-05-25 DIAGNOSIS — I10 Essential (primary) hypertension: Secondary | ICD-10-CM | POA: Diagnosis not present

## 2013-05-25 DIAGNOSIS — IMO0002 Reserved for concepts with insufficient information to code with codable children: Secondary | ICD-10-CM

## 2013-05-25 DIAGNOSIS — E1165 Type 2 diabetes mellitus with hyperglycemia: Secondary | ICD-10-CM

## 2013-05-25 LAB — BASIC METABOLIC PANEL
BUN: 10 mg/dL (ref 6–23)
CO2: 26 mEq/L (ref 19–32)
Calcium: 9.5 mg/dL (ref 8.4–10.5)
Chloride: 106 mEq/L (ref 96–112)
Creatinine, Ser: 0.6 mg/dL (ref 0.4–1.2)
GFR: 139.37 mL/min (ref >60.00)
Glucose, Bld: 280 mg/dL — ABNORMAL HIGH (ref 70–99)
Potassium: 4.2 mEq/L (ref 3.5–5.1)
Sodium: 141 mEq/L (ref 135–145)

## 2013-05-25 LAB — HEMOGLOBIN A1C: Hgb A1c MFr Bld: 14.6 % — ABNORMAL HIGH (ref 4.6–6.5)

## 2013-05-31 ENCOUNTER — Encounter: Payer: Medicare Other | Attending: Internal Medicine | Admitting: *Deleted

## 2013-05-31 ENCOUNTER — Encounter: Payer: Self-pay | Admitting: *Deleted

## 2013-05-31 VITALS — Ht 60.0 in | Wt 161.5 lb

## 2013-05-31 DIAGNOSIS — E119 Type 2 diabetes mellitus without complications: Secondary | ICD-10-CM | POA: Diagnosis not present

## 2013-05-31 DIAGNOSIS — Z794 Long term (current) use of insulin: Secondary | ICD-10-CM

## 2013-05-31 DIAGNOSIS — Z713 Dietary counseling and surveillance: Secondary | ICD-10-CM | POA: Diagnosis not present

## 2013-05-31 DIAGNOSIS — E1165 Type 2 diabetes mellitus with hyperglycemia: Secondary | ICD-10-CM

## 2013-05-31 DIAGNOSIS — IMO0002 Reserved for concepts with insufficient information to code with codable children: Secondary | ICD-10-CM

## 2013-05-31 NOTE — Progress Notes (Signed)
  Medical Nutrition Therapy:  Appt start time: 1130 end time:  1200.  Assessment:  Primary concerns today: Diabetes follow up visit on 05/31/13. Pleased with weight loss of 6 pounds!, Had to travel to [facility] for work for 3 weeks, hat to eat out more,more higher calorie foods.  She states BG continue to be in the 300's with less thirst but continued polyuria. She is doing better at taking Lantus at same time each night.   Preferred Learning Style:   No preference indicated   Learning Readiness:   Contemplating  Ready  Change in progress  MEDICATIONS:  see list, diabetes medications are Lantusr and Novolog   DIETARY INTAKE:  24-hr recall:  B ( AM): coffee with flavored coffee mate, unsweetened cereal with banana OR 2 mini bagels with cream cream cheese OR eggs, bacon or sausage, 1 toast with oleo, usually fruit juice OR more oatmeal lately Snk ( AM): no  L ( PM): sandwich OR left overs OR canned [employer] occasionally, less regular soda in bottle but more diet or unsweet tea with Splenda Snk ( PM): handful of chips or pretzels OR cottage cheese and fruit D ( PM): meat, starch, vegetables, salad, bread, unsweet tea with Splenda or water Snk ( PM): fresh fruit OR left overs on a piece of bread Beverages: less regular soda, water, unsweet tea and coffee   Usual physical activity: daily chores and errands   Estimated energy needs: 1500 calories 170 g carbohydrates 112 g protein 42 g fat  Progress Towards Goal(s):  In progress.   Nutritional Diagnosis:  NB-1.1 Food and nutrition-related knowledge deficit As related to diabetes management.  As evidenced by A1c of 10%.    Intervention:  Nutrition counseling and diabetes education continued. Revieweded Carb Counting as method of portion control, reading food labels, and benefits of increased activity. Commended her for take the Lantus at a more consistent time each day.  Plan:  Continue to aim for 3 Carb Choices per meal (45 grams)  +/- 1 either way  Continue to aim for 0-2 Carbs per snack if hungry  Continue to include protein in moderation with your meals and snacks Continue reading food labels for Total Carbohydrate of foods Continue checking BG before breakfast and lunch as directed by your MD  Continue taking Levemir insulin medication at consistent time at night as directed by MD Your MD will probably increase your Lantus dose since your fasting BGs are still too high.   Teaching Method Utilized: Visual, Auditory and Hands on  Handouts given during visit include:  No new handouts today  Barriers to learning/adherence to lifestyle change: improved  Demonstrated degree of understanding via:  Teach Back   Monitoring/Evaluation:  Dietary intake, exercise, reading food labels, and body weight in 6 month(s).

## 2013-05-31 NOTE — Patient Instructions (Signed)
Plan:  Continue to aim for 3 Carb Choices per meal (45 grams) +/- 1 either way  Continue to aim for 0-2 Carbs per snack if hungry  Continue to include protein in moderation with your meals and snacks Continue reading food labels for Total Carbohydrate of foods Continue checking BG before breakfast and lunch as directed by your MD  Continue taking Levemir insulin medication at consistent time at night as directed by MD Your MD will probably increase your Lantus dose since your fasting BGs are still too high.

## 2013-06-01 DIAGNOSIS — M79609 Pain in unspecified limb: Secondary | ICD-10-CM | POA: Diagnosis not present

## 2013-06-01 DIAGNOSIS — B351 Tinea unguium: Secondary | ICD-10-CM | POA: Diagnosis not present

## 2013-06-15 ENCOUNTER — Encounter: Payer: Self-pay | Admitting: Internal Medicine

## 2013-06-15 ENCOUNTER — Ambulatory Visit (INDEPENDENT_AMBULATORY_CARE_PROVIDER_SITE_OTHER): Payer: Medicare Other | Admitting: Internal Medicine

## 2013-06-15 VITALS — BP 134/76 | HR 84 | Temp 98.1°F | Ht 60.0 in | Wt 162.0 lb

## 2013-06-15 DIAGNOSIS — Z794 Long term (current) use of insulin: Secondary | ICD-10-CM

## 2013-06-15 DIAGNOSIS — IMO0002 Reserved for concepts with insufficient information to code with codable children: Secondary | ICD-10-CM

## 2013-06-15 DIAGNOSIS — IMO0001 Reserved for inherently not codable concepts without codable children: Secondary | ICD-10-CM

## 2013-06-15 DIAGNOSIS — E1165 Type 2 diabetes mellitus with hyperglycemia: Secondary | ICD-10-CM

## 2013-06-15 DIAGNOSIS — I10 Essential (primary) hypertension: Secondary | ICD-10-CM

## 2013-06-15 MED ORDER — INSULIN ASPART 100 UNIT/ML FLEXPEN: PEN_INJECTOR | SUBCUTANEOUS | Status: DC

## 2013-06-15 MED ORDER — INSULIN GLARGINE 100 UNIT/ML SOLOSTAR PEN: PEN_INJECTOR | SUBCUTANEOUS | Status: DC

## 2013-06-15 NOTE — Assessment & Plan Note (Addendum)
Patient's blood sugars poorly controlled. Her A1c has significantly worsened. Increase Lantus to 30 units at 10 PM.  Patient advised she can titrate her Lantus up by 3 units every other day until her fasting blood sugar is less than 150.  She understands to limit titration to 50 units.  Reassess in 3 weeks. Patient advised to check her blood sugars 4 times a day for the next 1 to 2 weeks. Patient also advised to keep a food log.  She will contact our office next week with blood sugar readings. Lab Results  Component Value Date   HGBA1C 14.6* 05/25/2013   Lab Results  Component Value Date   CREATININE 0.6 05/25/2013

## 2013-06-15 NOTE — Progress Notes (Signed)
Pre visit review using our clinic review tool, if applicable. No additional management support is needed unless otherwise documented below in the visit note. 

## 2013-06-15 NOTE — Progress Notes (Signed)
Subjective:    Patient ID: Sarah [name], female    DOB: 1945/07/03, 68 y.o.   MRN: [id number]  HPI  68 year old African American female with history of uncontrolled type 2 diabetes, hypertension and hyperlipidemia for routine followup.  Despite meeting with diabetic educator, her blood sugars have been worsening. Her morning blood sugars are frequently greater than 250. She reports taking her insulin as directed.  Patient trying to do better with following diabetic diet but has been under a lot of stress the last several months.  Hypertension - stable  Review of Systems Negative for chest pain, polyuria    Past Medical History  Diagnosis Date  . Heart murmur   . Hyperlipidemia   . Type II or unspecified type diabetes mellitus without mention of complication, uncontrolled   . Boils   . HTN (hypertension)   . Hx of adenomatous colonic polyps   . Glaucoma   . Osteoporosis   . Pneumonia     History   Social History  . Marital Status: Single    Spouse Name: N/A    Number of Children: N/A  . Years of Education: N/A   Occupational History  . Not on file.   Social History Main Topics  . Smoking status: Current Some Day Smoker  . Smokeless tobacco: Never Used     Comment: smokes occ.   Marland Kitchen Alcohol Use: Yes  . Drug Use: No  . Sexual Activity: Not on file   Other Topics Concern  . Not on file   Social History Narrative  . No narrative on file    Past Surgical History  Procedure Laterality Date  . Colonoscopy    . Polypectomy    . Abdominal hysterectomy    . Tonsillectomy    . Carpal tunnel release      right  . Knee arthroscopy      right  . Orif ankle fracture Right 03/24/2012    Procedure: OPEN REDUCTION INTERNAL FIXATION (ORIF) ANKLE FRACTURE;  Surgeon: Newt Minion, MD;  Location: [facility];  Service: Orthopedics;  Laterality: Right;  Open Reduction Internal Fixation Right Bimalleolar ankle fracture    Family History  Problem Relation Age of Onset  .  Colon cancer Neg Hx   . Esophageal cancer Neg Hx   . Rectal cancer Neg Hx   . Stomach cancer Neg Hx   . Diabetes Mother   . Hypertension Mother     No Known Allergies  Current Outpatient Prescriptions on File Prior to Visit  Medication Sig Dispense Refill  . aspirin EC 81 MG tablet Take 81 mg by mouth daily.      Marland Kitchen atorvastatin (LIPITOR) 20 MG tablet Take 1 tablet (20 mg total) by mouth daily.  90 tablet  1  . HYDROcodone-acetaminophen (NORCO/VICODIN) 5-325 MG per tablet Take 1 tablet by mouth every 6 (six) hours as needed for pain.  10 tablet  0  . Insulin Pen Needle (CLICKFINE PEN NEEDLES) 31G X 8 MM MISC Use 4 x daily as directed  120 each  11  . metFORMIN (GLUCOPHAGE) 1000 MG tablet Take 1 tablet (1,000 mg total) by mouth 2 (two) times daily with a meal.  180 tablet  1  . miconazole (MICOTIN) 200 MG vaginal suppository Place 1 suppository (200 mg total) vaginally at bedtime.  3 suppository  0  . oxyCODONE-acetaminophen (ROXICET) 5-325 MG per tablet Take 1 tablet by mouth every 4 (four) hours as needed for pain.  60 tablet  0  . valsartan-hydrochlorothiazide (DIOVAN-HCT) 320-25 MG per tablet Take 1 tablet by mouth daily.  30 tablet  5  . Vitamin D, Ergocalciferol, (DRISDOL) 50000 UNITS CAPS capsule TAKE ONE CAPSULE BY MOUTH TWICE WEEKLY ON MONDAYS AND THURSDAYS  8 capsule  0   No current facility-administered medications on file prior to visit.    BP 134/76  Pulse 84  Temp(Src) 98.1 F (36.7 C) (Oral)  Ht 5' (1.524 m)  Wt 162 lb (73.483 kg)  BMI 31.64 kg/m2     Objective:   Physical Exam  Constitutional: She is oriented to person, place, and time. She appears well-developed and well-nourished. No distress.  HENT:  Head: Normocephalic and atraumatic.  Cardiovascular: Normal rate, regular rhythm and normal heart sounds.   No murmur heard. Pulmonary/Chest: Effort normal and breath sounds normal. She has no wheezes.  Musculoskeletal: She exhibits no edema.  Neurological:  She is alert and oriented to person, place, and time.  Skin: Skin is warm and dry.  Psychiatric: She has a normal mood and affect. Her behavior is normal.          Assessment & Plan:

## 2013-06-15 NOTE — Assessment & Plan Note (Signed)
Stable.  No change in medication. BP: 134/76 mmHg

## 2013-06-15 NOTE — Patient Instructions (Signed)
Keep food and blood sugar log as directed

## 2013-06-20 ENCOUNTER — Encounter: Payer: Self-pay | Admitting: Internal Medicine

## 2013-06-29 DIAGNOSIS — B351 Tinea unguium: Secondary | ICD-10-CM | POA: Diagnosis not present

## 2013-06-29 DIAGNOSIS — M79609 Pain in unspecified limb: Secondary | ICD-10-CM | POA: Diagnosis not present

## 2013-07-06 ENCOUNTER — Ambulatory Visit: Payer: Medicare Other | Admitting: Internal Medicine

## 2013-07-11 ENCOUNTER — Encounter: Payer: Self-pay | Admitting: Internal Medicine

## 2013-07-11 ENCOUNTER — Ambulatory Visit (INDEPENDENT_AMBULATORY_CARE_PROVIDER_SITE_OTHER): Payer: Medicare Other | Admitting: Internal Medicine

## 2013-07-11 VITALS — BP 124/68 | HR 84 | Temp 97.8°F | Ht 60.0 in | Wt 164.0 lb

## 2013-07-11 DIAGNOSIS — Z794 Long term (current) use of insulin: Secondary | ICD-10-CM | POA: Diagnosis not present

## 2013-07-11 DIAGNOSIS — Z23 Encounter for immunization: Secondary | ICD-10-CM | POA: Diagnosis not present

## 2013-07-11 DIAGNOSIS — E1165 Type 2 diabetes mellitus with hyperglycemia: Secondary | ICD-10-CM

## 2013-07-11 DIAGNOSIS — IMO0001 Reserved for inherently not codable concepts without codable children: Secondary | ICD-10-CM

## 2013-07-11 DIAGNOSIS — IMO0002 Reserved for concepts with insufficient information to code with codable children: Secondary | ICD-10-CM

## 2013-07-11 DIAGNOSIS — I10 Essential (primary) hypertension: Secondary | ICD-10-CM | POA: Diagnosis not present

## 2013-07-11 MED ORDER — METFORMIN HCL 1000 MG PO TABS: 1000.0000 mg | ORAL_TABLET | Freq: Two times a day (BID) | ORAL | Status: DC

## 2013-07-11 MED ORDER — INSULIN ASPART 100 UNIT/ML FLEXPEN: PEN_INJECTOR | SUBCUTANEOUS | Status: DC

## 2013-07-11 MED ORDER — VALSARTAN-HYDROCHLOROTHIAZIDE 320-25 MG PO TABS: 1.0000 | ORAL_TABLET | Freq: Every day | ORAL | Status: DC

## 2013-07-11 NOTE — Assessment & Plan Note (Signed)
Controlled.  No change in medication regimen. BP: 124/68 mmHg  Lab Results  Component Value Date   CREATININE 0.6 05/25/2013

## 2013-07-11 NOTE — Progress Notes (Signed)
Subjective:    Patient ID: Sarah [name], female    DOB: Aug 12, 1945, 68 y.o.   MRN: [id number]  HPI  68 year old African American female for followup regarding uncontrolled type 2 diabetes. Patient reports titrating her Lantus up to 55 units at bedtime. Her morning blood sugars are between 180 and 210. Her postprandial blood sugars are still significantly elevated between 280 and 300. She is currently using 26 units of NovoLog. Her evening carbohydrate intake is between approximately 80-90 grams.    Hypertension - stable  Review of Systems Negative for visual changes, Negative for paresthesias    Past Medical History  Diagnosis Date  . Heart murmur   . Hyperlipidemia   . Type II or unspecified type diabetes mellitus without mention of complication, uncontrolled   . Boils   . HTN (hypertension)   . Hx of adenomatous colonic polyps   . Glaucoma   . Osteoporosis   . Pneumonia     History   Social History  . Marital Status: Single    Spouse Name: N/A    Number of Children: N/A  . Years of Education: N/A   Occupational History  . Not on file.   Social History Main Topics  . Smoking status: Current Some Day Smoker  . Smokeless tobacco: Never Used     Comment: smokes occ.   Marland Kitchen Alcohol Use: Yes  . Drug Use: No  . Sexual Activity: Not on file   Other Topics Concern  . Not on file   Social History Narrative  . No narrative on file    Past Surgical History  Procedure Laterality Date  . Colonoscopy    . Polypectomy    . Abdominal hysterectomy    . Tonsillectomy    . Carpal tunnel release      right  . Knee arthroscopy      right  . Orif ankle fracture Right 03/24/2012    Procedure: OPEN REDUCTION INTERNAL FIXATION (ORIF) ANKLE FRACTURE;  Surgeon: Newt Minion, MD;  Location: [facility];  Service: Orthopedics;  Laterality: Right;  Open Reduction Internal Fixation Right Bimalleolar ankle fracture    Family History  Problem Relation Age of Onset  . Colon cancer  Neg Hx   . Esophageal cancer Neg Hx   . Rectal cancer Neg Hx   . Stomach cancer Neg Hx   . Diabetes Mother   . Hypertension Mother     No Known Allergies  Current Outpatient Prescriptions on File Prior to Visit  Medication Sig Dispense Refill  . amLODipine (NORVASC) 5 MG tablet Take 10 mg by mouth daily.      Marland Kitchen aspirin EC 81 MG tablet Take 81 mg by mouth daily.      Marland Kitchen atorvastatin (LIPITOR) 20 MG tablet Take 1 tablet (20 mg total) by mouth daily.  90 tablet  1  . Insulin Glargine (LANTUS SOLOSTAR) 100 UNIT/ML Solostar Pen Use 30-50 units at bedtime as directed  5 pen  5  . Insulin Pen Needle (CLICKFINE PEN NEEDLES) 31G X 8 MM MISC Use 4 x daily as directed  120 each  11  . miconazole (MICOTIN) 200 MG vaginal suppository Place 1 suppository (200 mg total) vaginally at bedtime.  3 suppository  0  . Vitamin D, Ergocalciferol, (DRISDOL) 50000 UNITS CAPS capsule TAKE ONE CAPSULE BY MOUTH TWICE WEEKLY ON MONDAYS AND THURSDAYS  8 capsule  0   No current facility-administered medications on file prior to visit.    BP 124/68  Pulse 84  Temp(Src) 97.8 F (36.6 C) (Oral)  Ht 5' (1.524 m)  Wt 164 lb (74.39 kg)  BMI 32.03 kg/m2    Objective:   Physical Exam  Constitutional: She is oriented to person, place, and time. She appears well-developed and well-nourished. No distress.  HENT:  Head: Normocephalic and atraumatic.  Cardiovascular: Normal rate, regular rhythm and normal heart sounds.   Pulmonary/Chest: Effort normal and breath sounds normal. She has no wheezes.  Neurological: She is alert and oriented to person, place, and time. No cranial nerve deficit.  Skin: Skin is warm and dry.  Rough skin sides of lateral foot bilaterally  Psychiatric: She has a normal mood and affect. Her behavior is normal.          Assessment & Plan:

## 2013-07-11 NOTE — Progress Notes (Signed)
Pre visit review using our clinic review tool, if applicable. No additional management support is needed unless otherwise documented below in the visit note. 

## 2013-07-11 NOTE — Assessment & Plan Note (Signed)
Lantus titrated up to 55 units. Her postprandial blood sugar after her evening meal is significantly elevated. Patient advised to decrease her carbohydrate intake to approximately 50 g and increase her mealtime insulin to 30-35 units until her postprandial blood sugars are approximately 150.

## 2013-08-14 ENCOUNTER — Ambulatory Visit: Payer: Medicare Other | Admitting: *Deleted

## 2013-09-04 ENCOUNTER — Encounter: Payer: Medicare Other | Attending: Internal Medicine | Admitting: *Deleted

## 2013-09-04 DIAGNOSIS — Z794 Long term (current) use of insulin: Secondary | ICD-10-CM | POA: Diagnosis not present

## 2013-09-04 DIAGNOSIS — Z713 Dietary counseling and surveillance: Secondary | ICD-10-CM | POA: Insufficient documentation

## 2013-09-04 DIAGNOSIS — IMO0001 Reserved for inherently not codable concepts without codable children: Secondary | ICD-10-CM | POA: Insufficient documentation

## 2013-09-04 DIAGNOSIS — E1165 Type 2 diabetes mellitus with hyperglycemia: Secondary | ICD-10-CM

## 2013-09-04 DIAGNOSIS — IMO0002 Reserved for concepts with insufficient information to code with codable children: Secondary | ICD-10-CM

## 2013-09-04 NOTE — Progress Notes (Signed)
  Medical Nutrition Therapy:  Appt start time: 1430 end time:  1500.  Assessment:  Primary concerns today: Diabetes follow up visit on 09/04/13. Weight loss of 2 pounds noted. Rough month with having to move her Dad to another home. She has run out of insulin for the past week. She continues to SMBG and reports BG range still under 260 mg/dl. She is checking in AM and before bedtime.   Lost another 2 lbs since last visit!, Had to travel to [facility] for work for 3 weeks, hat to eat out more,more higher calorie foods.  She states BG continue to be in the 300's with less thirst but continued polyuria. She is doing better at taking Lantus at same time each night.   Preferred Learning Style:   No preference indicated   Learning Readiness:   Contemplating  Ready  Change in progress  MEDICATIONS:  see list, diabetes medications are Lantus and Novolog. Currently out of her Lantus and Novolog but states she is going to try to go by physician's office to see about getting a sample pen of both until she can purchase them again.   DIETARY INTAKE:  24-hr recall:  B ( AM): coffee with flavored coffee mate, unsweetened cereal with banana OR 1 mini bagels or 1/2 of large bagel with cream cream cheese OR eggs, bacon or sausage, 1 toast with oleo, usually fruit juice OR more oatmeal lately Snk ( AM): no  L ( PM): sandwich OR left overs OR canned [employer] occasionally, even less regular soda in bottle but more diet or unsweet tea with Splenda Snk ( PM): handful of chips or pretzels OR cottage cheese and fruit D ( PM): meat, starch, vegetables, salad, bread, unsweet tea with Splenda or water Snk ( PM): fresh fruit OR left overs on a piece of bread Beverages: less regular soda, water, unsweet tea and coffee   Usual physical activity: daily chores and errands   Estimated energy needs: 1500 calories 170 g carbohydrates 112 g protein 42 g fat  Progress Towards Goal(s):  In progress.   Nutritional  Diagnosis:  NB-1.1 Food and nutrition-related knowledge deficit As related to diabetes management.  As evidenced by A1c of 10%.    Intervention:  Reviewed meal plans with 45 g carbs per meal for improved blood sugar control. Commended her for take the Lantus at a more consistent time each day.  Plan:  Continue to aim for 3 Carb Choices per meal (45 grams) +/- 1 either way  Continue to aim for 0-2 Carbs per snack if hungry  Continue to include protein in moderation with your meals and snacks Continue reading food labels for Total Carbohydrate of foods Continue checking BG before breakfast and lunch as directed by your MD  Continue taking Lantus insulin medication at consistent time at night as directed by MD Your MD will probably increase your Lantus dose since your fasting BGs are still too high.    Teaching Method Utilized: Visual, Auditory and Hands on  Handouts given during visit include:  No new handouts today  Barriers to learning/adherence to lifestyle change: improved  Demonstrated degree of understanding via:  Teach Back   Monitoring/Evaluation:  Dietary intake, exercise, reading food labels, and body weight in PRN as needed.Marland Kitchen

## 2013-09-04 NOTE — Patient Instructions (Addendum)
Plan:  Continue to aim for 3 Carb Choices per meal (45 grams) +/- 1 either way  Continue to aim for 0-2 Carbs per snack if hungry  Continue to include protein in moderation with your meals and snacks Continue reading food labels for Total Carbohydrate of foods Continue checking BG before breakfast and lunch as directed by your MD  Continue taking Lantus insulin medication at consistent time at night as directed by MD Your MD will probably increase your Lantus dose since your fasting BGs are still too high.

## 2013-09-11 ENCOUNTER — Encounter (HOSPITAL_COMMUNITY): Payer: Self-pay | Admitting: Emergency Medicine

## 2013-09-11 ENCOUNTER — Emergency Department (HOSPITAL_COMMUNITY)
Admission: EM | Admit: 2013-09-11 | Discharge: 2013-09-11 | Disposition: A | Discharge status: Home / Self Care | Payer: Medicare Other | Attending: Emergency Medicine | Admitting: Emergency Medicine

## 2013-09-11 DIAGNOSIS — H409 Unspecified glaucoma: Secondary | ICD-10-CM | POA: Insufficient documentation

## 2013-09-11 DIAGNOSIS — IMO0002 Reserved for concepts with insufficient information to code with codable children: Secondary | ICD-10-CM | POA: Diagnosis not present

## 2013-09-11 DIAGNOSIS — Z860100 Personal history of colon polyps, unspecified: Secondary | ICD-10-CM | POA: Insufficient documentation

## 2013-09-11 DIAGNOSIS — E785 Hyperlipidemia, unspecified: Secondary | ICD-10-CM | POA: Diagnosis not present

## 2013-09-11 DIAGNOSIS — E1165 Type 2 diabetes mellitus with hyperglycemia: Secondary | ICD-10-CM | POA: Diagnosis present

## 2013-09-11 DIAGNOSIS — Z79899 Other long term (current) drug therapy: Secondary | ICD-10-CM | POA: Diagnosis not present

## 2013-09-11 DIAGNOSIS — R011 Cardiac murmur, unspecified: Secondary | ICD-10-CM | POA: Insufficient documentation

## 2013-09-11 DIAGNOSIS — E1169 Type 2 diabetes mellitus with other specified complication: Secondary | ICD-10-CM | POA: Diagnosis not present

## 2013-09-11 DIAGNOSIS — F17200 Nicotine dependence, unspecified, uncomplicated: Secondary | ICD-10-CM | POA: Diagnosis not present

## 2013-09-11 DIAGNOSIS — Z794 Long term (current) use of insulin: Secondary | ICD-10-CM | POA: Diagnosis not present

## 2013-09-11 DIAGNOSIS — I10 Essential (primary) hypertension: Secondary | ICD-10-CM | POA: Insufficient documentation

## 2013-09-11 DIAGNOSIS — Z7982 Long term (current) use of aspirin: Secondary | ICD-10-CM | POA: Insufficient documentation

## 2013-09-11 DIAGNOSIS — R6889 Other general symptoms and signs: Secondary | ICD-10-CM | POA: Diagnosis not present

## 2013-09-11 DIAGNOSIS — Z8701 Personal history of pneumonia (recurrent): Secondary | ICD-10-CM | POA: Diagnosis not present

## 2013-09-11 DIAGNOSIS — Z872 Personal history of diseases of the skin and subcutaneous tissue: Secondary | ICD-10-CM | POA: Insufficient documentation

## 2013-09-11 DIAGNOSIS — E162 Hypoglycemia, unspecified: Secondary | ICD-10-CM

## 2013-09-11 LAB — CBC
HCT: 38.8 % (ref 36.0–46.0)
Hemoglobin: 13 g/dL (ref 12.0–15.0)
MCH: 31.3 pg (ref 26.0–34.0)
MCHC: 33.5 g/dL (ref 30.0–36.0)
MCV: 93.3 fL (ref 78.0–100.0)
Platelets: 283 10*3/uL (ref 150–400)
RBC: 4.16 MIL/uL (ref 3.87–5.11)
RDW: 12.7 % (ref 11.5–15.5)
WBC: 7 10*3/uL (ref 4.0–10.5)

## 2013-09-11 LAB — BASIC METABOLIC PANEL
Anion gap: 14 (ref 5–15)
BUN: 16 mg/dL (ref 6–23)
CO2: 24 mEq/L (ref 19–32)
Calcium: 9.9 mg/dL (ref 8.4–10.5)
Chloride: 105 mEq/L (ref 96–112)
Creatinine, Ser: 0.66 mg/dL (ref 0.50–1.10)
GFR calc Af Amer: >90 mL/min (ref >90)
GFR calc non Af Amer: >90 mL/min (ref >90)
Glucose, Bld: 93 mg/dL (ref 70–99)
Potassium: 3.7 mEq/L (ref 3.7–5.3)
Sodium: 143 mEq/L (ref 137–147)

## 2013-09-11 LAB — I-STAT CHEM 8, ED
BUN: 16 mg/dL (ref 6–23)
Calcium, Ion: 1.24 mmol/L (ref 1.13–1.30)
Chloride: 106 mEq/L (ref 96–112)
Creatinine, Ser: 0.7 mg/dL (ref 0.50–1.10)
Glucose, Bld: 91 mg/dL (ref 70–99)
HCT: 42 % (ref 36.0–46.0)
Hemoglobin: 14.3 g/dL (ref 12.0–15.0)
Potassium: 3.3 mEq/L — ABNORMAL LOW (ref 3.7–5.3)
Sodium: 143 mEq/L (ref 137–147)
TCO2: 24 mmol/L (ref 0–100)

## 2013-09-11 LAB — CBG MONITORING, ED
Glucose-Capillary: 85 mg/dL (ref 70–99)
Glucose-Capillary: 163 mg/dL — ABNORMAL HIGH (ref 70–99)

## 2013-09-11 MED ORDER — SODIUM CHLORIDE 0.9 % IV BOLUS (SEPSIS)
1000.0000 mL | Freq: Once | INTRAVENOUS | Status: AC
  Administered 2013-09-11: 1000 mL via INTRAVENOUS

## 2013-09-11 MED ORDER — POTASSIUM CHLORIDE CRYS ER 20 MEQ PO TBCR
40.0000 meq | EXTENDED_RELEASE_TABLET | Freq: Once | ORAL | Status: AC
  Administered 2013-09-11: 40 meq via ORAL
  Filled 2013-09-11: qty 2

## 2013-09-11 NOTE — ED Notes (Signed)
Patient given sandwich cheese and water. Tolerated well.

## 2013-09-11 NOTE — ED Provider Notes (Signed)
CSN: [id number]     Arrival date & time 09/11/13  1117 History   First MD Initiated Contact with Patient 09/11/13 1126     Chief Complaint  Patient presents with  . Hypoglycemia  . Hypotension  . Weakness     (Consider location/radiation/quality/duration/timing/severity/associated sxs/prior Treatment) HPI Pt presents with c/o feeling weak and shaky- feeling like her blood sugar was low.  She was with her niece at [facility] hospital for an appointment.  EMS found her blood sugar to be low- she was given D50 and blood sugar improved.  Pt states she took her morning novolog based on a sliding scale as her sugar was over 200.  However she just drank coffee for breakfast and normally has more food than that.  No recent fever/chills.  No vomiting or other symptoms prior to this.  No chest pain.  No difficulty breathing.  There are no other associated systemic symptoms, there are no other alleviating or modifying factors.   Past Medical History  Diagnosis Date  . Heart murmur   . Hyperlipidemia   . Type II or unspecified type diabetes mellitus without mention of complication, uncontrolled   . Boils   . HTN (hypertension)   . Hx of adenomatous colonic polyps   . Glaucoma   . Osteoporosis   . Pneumonia    Past Surgical History  Procedure Laterality Date  . Colonoscopy    . Polypectomy    . Abdominal hysterectomy    . Tonsillectomy    . Carpal tunnel release      right  . Knee arthroscopy      right  . Orif ankle fracture Right 03/24/2012    Procedure: OPEN REDUCTION INTERNAL FIXATION (ORIF) ANKLE FRACTURE;  Surgeon: Newt Minion, MD;  Location: [facility];  Service: Orthopedics;  Laterality: Right;  Open Reduction Internal Fixation Right Bimalleolar ankle fracture   Family History  Problem Relation Age of Onset  . Colon cancer Neg Hx   . Esophageal cancer Neg Hx   . Rectal cancer Neg Hx   . Stomach cancer Neg Hx   . Diabetes Mother   . Hypertension Mother    History  Substance Use  Topics  . Smoking status: Current Some Day Smoker  . Smokeless tobacco: Never Used     Comment: smokes occ.   Marland Kitchen Alcohol Use: Yes   OB History   Grav Para Term Preterm Abortions TAB SAB Ect Mult Living                 Review of Systems ROS reviewed and all otherwise negative except for mentioned in HPI    Allergies  Review of patient's allergies indicates no known allergies.  Home Medications   Prior to Admission medications   Medication Sig Start Date End Date Taking? Authorizing Provider  amLODipine (NORVASC) 5 MG tablet Take 10 mg by mouth daily. 03/23/13  Yes Doe-Hyun R Shawna Orleans, DO  aspirin EC 81 MG tablet Take 81 mg by mouth daily.   Yes Historical Provider, MD  atorvastatin (LIPITOR) 20 MG tablet Take 1 tablet (20 mg total) by mouth daily. 03/23/13  Yes Doe-Hyun R Shawna Orleans, DO  insulin aspart (NOVOLOG) 100 UNIT/ML FlexPen Use 25-35 units 15 mins before meals 3 x daily 07/11/13  Yes Doe-Hyun R Shawna Orleans, DO  Insulin Glargine (LANTUS SOLOSTAR) 100 UNIT/ML Solostar Pen Use 30-50 units at bedtime as directed 06/15/13  Yes Doe-Hyun R Shawna Orleans, DO  Insulin Pen Needle (CLICKFINE PEN NEEDLES) 31G X 8  MM MISC Use 4 x daily as directed 01/23/13  Yes Doe-Hyun R Shawna Orleans, DO  metFORMIN (GLUCOPHAGE) 1000 MG tablet Take 1 tablet (1,000 mg total) by mouth 2 (two) times daily with a meal. 07/11/13  Yes Doe-Hyun R Shawna Orleans, DO  miconazole (MICOTIN) 200 MG vaginal suppository Place 1 suppository (200 mg total) vaginally at bedtime. 03/23/13  Yes Doe-Hyun R Shawna Orleans, DO  terbinafine (LAMISIL) 250 MG tablet Take 250 mg by mouth daily.   Yes Historical Provider, MD  valsartan-hydrochlorothiazide (DIOVAN-HCT) 320-25 MG per tablet Take 1 tablet by mouth daily. 07/11/13  Yes Doe-Hyun Kyra Searles, DO  Vitamin D, Ergocalciferol, (DRISDOL) 50000 UNITS CAPS capsule TAKE ONE CAPSULE BY MOUTH TWICE WEEKLY ON MONDAYS AND THURSDAYS   Yes Doe-Hyun R Yoo, DO   BP 126/65  Pulse 88  Temp(Src) 98.5 F (36.9 C) (Oral)  Resp 16  SpO2 100% Vitals  reviewed Physical Exam Physical Examination: General appearance - alert, well appearing, and in no distress Mental status - alert, oriented to person, place, and time Eyes - no conjunctival injection, no scleral icterus Mouth - mucous membranes moist, pharynx normal without lesions Chest - clear to auscultation, no wheezes, rales or rhonchi, symmetric air entry Heart - normal rate, regular rhythm, normal S1, S2, no murmurs, rubs, clicks or gallops Abdomen - soft, nontender, nondistended, no masses or organomegaly Extremities - peripheral pulses normal, no pedal edema, no clubbing or cyanosis Skin - normal coloration and turgor, no rashes  ED Course  Procedures (including critical care time) Labs Review Labs Reviewed  I-STAT CHEM 8, ED - Abnormal; Notable for the following:    Potassium 3.3 (*)    All other components within normal limits  CBG MONITORING, ED - Abnormal; Notable for the following:    Glucose-Capillary 163 (*)    All other components within normal limits  BASIC METABOLIC PANEL  CBC  CBG MONITORING, ED    Imaging Review No results found.   EKG Interpretation   Date/Time:  Tuesday September 11 2013 12:25:38 EDT Ventricular Rate:  77 PR Interval:  165 QRS Duration: 81 QT Interval:  376 QTC Calculation: 425 R Axis:   46 Text Interpretation:  Sinus rhythm RSR' in V1 or V2, probably normal  variant Nonspecific T abnormalities, lateral leads Minimal ST elevation,  inferior leads artifact in inferior leads No significant change since last  tracing Confirmed by [facility]  MD, Zaydan Papesh [phone]) on 09/11/2013 1:42:06 PM      MDM   Final diagnoses:  Hypoglycemia    Blood sugar has remained normal, she has eaten a sandwich.  Most likely cause is that she did not have anything for breakfast this morning except coffee since she was going to the appointment at [facility].  Pt is back to her baseline.  Kidney function is normal.  Pt is glad to discharge.  Discharged with strict  return precautions.  Pt agreeable with plan.    Threasa Beards, MD 09/12/13 [phone]

## 2013-09-11 NOTE — ED Notes (Signed)
[facility] to have a low blood sugar, the patients blood sugar normalized out and then developed low BP and was transported to [facility] for further evaluation/ BS is now 130 and Bp is now 116/80. Patient feels lightheaded, dizzy and weak all over.

## 2013-09-11 NOTE — Discharge Instructions (Signed)
Return to the ED with any concerns including weakness, fainting, vomiting, difficulty breathing, decreased level of alertness/lethargy, or any other alarming symptoms

## 2013-09-12 ENCOUNTER — Ambulatory Visit: Payer: Medicare Other | Admitting: Internal Medicine

## 2013-09-28 ENCOUNTER — Ambulatory Visit (INDEPENDENT_AMBULATORY_CARE_PROVIDER_SITE_OTHER): Payer: Medicare Other | Admitting: Internal Medicine

## 2013-09-28 VITALS — BP 142/80 | Temp 98.0°F | Wt 155.0 lb

## 2013-09-28 DIAGNOSIS — IMO0001 Reserved for inherently not codable concepts without codable children: Secondary | ICD-10-CM

## 2013-09-28 DIAGNOSIS — IMO0002 Reserved for concepts with insufficient information to code with codable children: Secondary | ICD-10-CM

## 2013-09-28 DIAGNOSIS — Z794 Long term (current) use of insulin: Secondary | ICD-10-CM

## 2013-09-28 DIAGNOSIS — Z23 Encounter for immunization: Secondary | ICD-10-CM

## 2013-09-28 DIAGNOSIS — E1165 Type 2 diabetes mellitus with hyperglycemia: Secondary | ICD-10-CM

## 2013-09-28 MED ORDER — INSULIN ASPART 100 UNIT/ML FLEXPEN: PEN_INJECTOR | SUBCUTANEOUS | Status: DC

## 2013-09-28 MED ORDER — INSULIN GLARGINE 300 UNIT/ML ~~LOC~~ SOPN: 30.0000 [IU] | PEN_INJECTOR | Freq: Every day | SUBCUTANEOUS | Status: DC

## 2013-09-28 NOTE — Patient Instructions (Signed)
Please complete the following lab tests before your next follow up appointment:  BMET, A1c - 250.02 

## 2013-09-28 NOTE — Progress Notes (Signed)
Subjective:    Patient ID: Sarah [name], female    DOB: 14-Dec-1945, 68 y.o.   MRN: [id number]  HPI  67 year old African American female with history of uncontrolled type 2 diabetes, hypertension and hyperlipidemia for follow up.  Interval medical history - patient was evaluated in emergency room on 09/11/2013 seen secondary to symptoms of hypoglycemia. Patient was visiting family member at [facility].  Patient took her normal morning mealtime insulin but did not eat enough breakfast.  Patient denies any further episodes of hypoglycemia. She has been taking lower doses of insulin to do financial reasons.  Hypertension - stable.  Review of Systems Negative for chest pain.  Negative for hypoglycemia    Past Medical History  Diagnosis Date  . Heart murmur   . Hyperlipidemia   . Type II or unspecified type diabetes mellitus without mention of complication, uncontrolled   . Boils   . HTN (hypertension)   . Hx of adenomatous colonic polyps   . Glaucoma   . Osteoporosis   . Pneumonia     History   Social History  . Marital Status: Single    Spouse Name: N/A    Number of Children: N/A  . Years of Education: N/A   Occupational History  . Not on file.   Social History Main Topics  . Smoking status: Current Some Day Smoker  . Smokeless tobacco: Never Used     Comment: smokes occ.   Marland Kitchen Alcohol Use: Yes  . Drug Use: No  . Sexual Activity: Not on file   Other Topics Concern  . Not on file   Social History Narrative  . No narrative on file    Past Surgical History  Procedure Laterality Date  . Colonoscopy    . Polypectomy    . Abdominal hysterectomy    . Tonsillectomy    . Carpal tunnel release      right  . Knee arthroscopy      right  . Orif ankle fracture Right 03/24/2012    Procedure: OPEN REDUCTION INTERNAL FIXATION (ORIF) ANKLE FRACTURE;  Surgeon: Newt Minion, MD;  Location: [facility];  Service: Orthopedics;  Laterality: Right;  Open Reduction Internal  Fixation Right Bimalleolar ankle fracture    Family History  Problem Relation Age of Onset  . Colon cancer Neg Hx   . Esophageal cancer Neg Hx   . Rectal cancer Neg Hx   . Stomach cancer Neg Hx   . Diabetes Mother   . Hypertension Mother     No Known Allergies  Current Outpatient Prescriptions on File Prior to Visit  Medication Sig Dispense Refill  . amLODipine (NORVASC) 5 MG tablet Take 10 mg by mouth daily.      Marland Kitchen aspirin EC 81 MG tablet Take 81 mg by mouth daily.      Marland Kitchen atorvastatin (LIPITOR) 20 MG tablet Take 1 tablet (20 mg total) by mouth daily.  90 tablet  1  . Insulin Glargine (LANTUS SOLOSTAR) 100 UNIT/ML Solostar Pen Use 30-50 units at bedtime as directed  5 pen  5  . Insulin Pen Needle (CLICKFINE PEN NEEDLES) 31G X 8 MM MISC Use 4 x daily as directed  120 each  11  . metFORMIN (GLUCOPHAGE) 1000 MG tablet Take 1 tablet (1,000 mg total) by mouth 2 (two) times daily with a meal.  180 tablet  1  . miconazole (MICOTIN) 200 MG vaginal suppository Place 1 suppository (200 mg total) vaginally at bedtime.  3 suppository  0  . terbinafine (LAMISIL) 250 MG tablet Take 250 mg by mouth daily.      . valsartan-hydrochlorothiazide (DIOVAN-HCT) 320-25 MG per tablet Take 1 tablet by mouth daily.  30 tablet  5  . Vitamin D, Ergocalciferol, (DRISDOL) 50000 UNITS CAPS capsule TAKE ONE CAPSULE BY MOUTH TWICE WEEKLY ON MONDAYS AND THURSDAYS  8 capsule  0   No current facility-administered medications on file prior to visit.    BP 142/80  Temp(Src) 98 F (36.7 C) (Oral)  Wt 155 lb (70.308 kg)    Objective:   Physical Exam  Constitutional: She is oriented to person, place, and time. She appears well-developed and well-nourished. No distress.  HENT:  Head: Normocephalic and atraumatic.  Cardiovascular: Normal rate, regular rhythm and normal heart sounds.   No murmur heard. Pulmonary/Chest: Effort normal and breath sounds normal. She has no wheezes.  Musculoskeletal: She exhibits no  edema.  Neurological: She is alert and oriented to person, place, and time. No cranial nerve deficit.  Psychiatric: She has a normal mood and affect. Her behavior is normal.          Assessment & Plan:

## 2013-09-28 NOTE — Assessment & Plan Note (Signed)
Patient suffered episode of mild hypoglycemia requiring emergency room evaluation on 91/2-15.  I stressed importance of decreasing mealtime insulin or holding mealtime insulin if she skips meals.    Samples of insulin provided.  Patient currently in "donut hole".

## 2013-09-28 NOTE — Progress Notes (Signed)
Pre visit review using our clinic review tool, if applicable. No additional management support is needed unless otherwise documented below in the visit note. Lab Results  Component Value Date   HGBA1C 14.6* 05/25/2013   HGBA1C 10.8* 02/09/2013   Lab Results  Component Value Date   MICROALBUR 12.9* 02/09/2013   LDLCALC 48 02/09/2013   CREATININE 0.70 09/11/2013

## 2013-10-27 ENCOUNTER — Encounter: Payer: Self-pay | Admitting: Internal Medicine

## 2013-10-30 ENCOUNTER — Other Ambulatory Visit: Payer: Self-pay | Admitting: Internal Medicine

## 2013-11-02 ENCOUNTER — Other Ambulatory Visit (INDEPENDENT_AMBULATORY_CARE_PROVIDER_SITE_OTHER): Payer: Medicare Other

## 2013-11-02 DIAGNOSIS — IMO0001 Reserved for inherently not codable concepts without codable children: Secondary | ICD-10-CM

## 2013-11-02 DIAGNOSIS — I10 Essential (primary) hypertension: Secondary | ICD-10-CM

## 2013-11-02 DIAGNOSIS — E1165 Type 2 diabetes mellitus with hyperglycemia: Secondary | ICD-10-CM | POA: Diagnosis not present

## 2013-11-02 LAB — BASIC METABOLIC PANEL
BUN: 11 mg/dL (ref 6–23)
CO2: 20 mEq/L (ref 19–32)
Calcium: 9.7 mg/dL (ref 8.4–10.5)
Chloride: 106 mEq/L (ref 96–112)
Creatinine, Ser: 0.6 mg/dL (ref 0.4–1.2)
GFR: 128.53 mL/min (ref >60.00)
Glucose, Bld: 317 mg/dL — ABNORMAL HIGH (ref 70–99)
Potassium: 4.4 mEq/L (ref 3.5–5.1)
Sodium: 140 mEq/L (ref 135–145)

## 2013-11-02 LAB — HEMOGLOBIN A1C: Hgb A1c MFr Bld: 13.2 % — ABNORMAL HIGH (ref 4.6–6.5)

## 2013-11-07 DIAGNOSIS — E119 Type 2 diabetes mellitus without complications: Secondary | ICD-10-CM | POA: Diagnosis not present

## 2013-11-07 DIAGNOSIS — H538 Other visual disturbances: Secondary | ICD-10-CM | POA: Diagnosis not present

## 2013-11-08 LAB — HM DIABETES EYE EXAM

## 2013-11-09 ENCOUNTER — Ambulatory Visit (INDEPENDENT_AMBULATORY_CARE_PROVIDER_SITE_OTHER): Payer: Medicare Other | Admitting: Internal Medicine

## 2013-11-09 ENCOUNTER — Encounter: Payer: Self-pay | Admitting: Internal Medicine

## 2013-11-09 VITALS — BP 124/64 | HR 80 | Temp 98.8°F | Ht 60.0 in | Wt 156.0 lb

## 2013-11-09 DIAGNOSIS — E1165 Type 2 diabetes mellitus with hyperglycemia: Secondary | ICD-10-CM

## 2013-11-09 DIAGNOSIS — Z794 Long term (current) use of insulin: Principal | ICD-10-CM

## 2013-11-09 DIAGNOSIS — IMO0002 Reserved for concepts with insufficient information to code with codable children: Secondary | ICD-10-CM

## 2013-11-09 MED ORDER — INSULIN ASPART 100 UNIT/ML FLEXPEN: PEN_INJECTOR | SUBCUTANEOUS | Status: DC

## 2013-11-09 MED ORDER — INSULIN GLARGINE 300 UNIT/ML ~~LOC~~ SOPN: 54.0000 [IU] | PEN_INJECTOR | Freq: Every day | SUBCUTANEOUS | Status: DC

## 2013-11-09 NOTE — Patient Instructions (Addendum)
Please complete the following lab tests before your next follow up appointment: BMET, A1c, microalb / cr ratio - 250.02 FLP, LFTs - 272.4

## 2013-11-09 NOTE — Progress Notes (Signed)
Pre visit review using our clinic review tool, if applicable. No additional management support is needed unless otherwise documented below in the visit note. 

## 2013-11-09 NOTE — Assessment & Plan Note (Addendum)
Patient with persistent poor control. Increase Lantus to 54 units at bedtime. I suggest referral to endocrinologist for more intensive monitoring and treatment.  Her recent diabetic eye exam is unremarkable. Lab Results  Component Value Date   HGBA1C 13.2 Repeated and verified X2.* 11/02/2013   HGBA1C 14.6* 05/25/2013   HGBA1C 10.8* 02/09/2013   Lab Results  Component Value Date   MICROALBUR 12.9* 02/09/2013   LDLCALC 48 02/09/2013   CREATININE 0.6 11/02/2013

## 2013-11-09 NOTE — Progress Notes (Signed)
Subjective:    Patient ID: Sarah [name], female    DOB: [birth year], 68 y.o.   MRN: [id number]  HPI  68 year old African American female with uncontrolled type 2 diabetes and hypertension for follow-up. Patient reports using her insulin however her A1c is still significantly elevated. Patient does not have her glucometer with her but reports her morning blood sugars are usually between 190 and 200. She also checks her blood sugar after lunch and it ranges in the mid to high 200s.  She reports some financial issues. She is medication "donut hole". We have tried to provide her with samples of Lantus and NovoLog.  Patient currently using 44 units of Lantus at bedtime and 33 units of NovoLog before meals.  Patient reports improved dietary compliance.  Review of Systems Recent diabetic eye exam unremarkable, negative for chest pain    Past Medical History  Diagnosis Date  . Heart murmur   . Hyperlipidemia   . Type II or unspecified type diabetes mellitus without mention of complication, uncontrolled   . Boils   . HTN (hypertension)   . Hx of adenomatous colonic polyps   . Glaucoma   . Osteoporosis   . Pneumonia     History   Social History  . Marital Status: Single    Spouse Name: N/A    Number of Children: N/A  . Years of Education: N/A   Occupational History  . Not on file.   Social History Main Topics  . Smoking status: Current Some Day Smoker  . Smokeless tobacco: Never Used     Comment: smokes occ.   Marland Kitchen Alcohol Use: Yes  . Drug Use: No  . Sexual Activity: Not on file   Other Topics Concern  . Not on file   Social History Narrative  . No narrative on file    Past Surgical History  Procedure Laterality Date  . Colonoscopy    . Polypectomy    . Abdominal hysterectomy    . Tonsillectomy    . Carpal tunnel release      right  . Knee arthroscopy      right  . Orif ankle fracture Right 03/24/2012    Procedure: OPEN REDUCTION INTERNAL FIXATION (ORIF)  ANKLE FRACTURE;  Surgeon: Newt Minion, MD;  Location: [facility];  Service: Orthopedics;  Laterality: Right;  Open Reduction Internal Fixation Right Bimalleolar ankle fracture    Family History  Problem Relation Age of Onset  . Colon cancer Neg Hx   . Esophageal cancer Neg Hx   . Rectal cancer Neg Hx   . Stomach cancer Neg Hx   . Diabetes Mother   . Hypertension Mother     No Known Allergies  Current Outpatient Prescriptions on File Prior to Visit  Medication Sig Dispense Refill  . amLODipine (NORVASC) 5 MG tablet Take 10 mg by mouth daily.      Marland Kitchen aspirin EC 81 MG tablet Take 81 mg by mouth daily.      Marland Kitchen atorvastatin (LIPITOR) 20 MG tablet Take 1 tablet (20 mg total) by mouth daily.  90 tablet  1  . insulin aspart (NOVOLOG) 100 UNIT/ML FlexPen Use 25-35 units 15 mins before meals 3 x daily  4 pen  0  . Insulin Glargine (LANTUS SOLOSTAR) 100 UNIT/ML Solostar Pen Use 30-50 units at bedtime as directed  5 pen  5  . Insulin Glargine (TOUJEO SOLOSTAR) 300 UNIT/ML SOPN Inject 30 Units into the skin daily.  4 pen  0  .  Insulin Pen Needle (CLICKFINE PEN NEEDLES) 31G X 8 MM MISC Use 4 x daily as directed  120 each  11  . metFORMIN (GLUCOPHAGE) 1000 MG tablet Take 1 tablet (1,000 mg total) by mouth 2 (two) times daily with a meal.  180 tablet  1  . miconazole (MICOTIN) 200 MG vaginal suppository Place 1 suppository (200 mg total) vaginally at bedtime.  3 suppository  0  . terbinafine (LAMISIL) 250 MG tablet Take 250 mg by mouth daily.      . valsartan-hydrochlorothiazide (DIOVAN-HCT) 320-25 MG per tablet Take 1 tablet by mouth daily.  30 tablet  5  . Vitamin D, Ergocalciferol, (DRISDOL) 50000 UNITS CAPS capsule TAKE 1 CAPSULE BY MOUTH TWICE A WEEK ON MONDAY AND THURSDAY  8 capsule  0   No current facility-administered medications on file prior to visit.    BP 124/64  Pulse 80  Temp(Src) 98.8 F (37.1 C) (Oral)  Ht 5' (1.524 m)  Wt 156 lb (70.761 kg)  BMI 30.47 kg/m2    Objective:    Physical Exam  Constitutional: She is oriented to person, place, and time. She appears well-developed and well-nourished.  HENT:  Head: Normocephalic and atraumatic.  Eyes: EOM are normal. Pupils are equal, round, and reactive to light.  Neck: Neck supple.  Cardiovascular: Normal rate, regular rhythm and normal heart sounds.   Pulmonary/Chest: Effort normal and breath sounds normal. She has no wheezes.  Musculoskeletal: She exhibits no edema.  Neurological: She is alert and oriented to person, place, and time. No cranial nerve deficit.  Skin: Skin is warm and dry.  Psychiatric: She has a normal mood and affect. Her behavior is normal.          Assessment & Plan:

## 2013-11-14 ENCOUNTER — Other Ambulatory Visit: Payer: Self-pay | Admitting: Internal Medicine

## 2013-12-03 ENCOUNTER — Ambulatory Visit: Payer: Medicare Other | Admitting: Internal Medicine

## 2013-12-17 ENCOUNTER — Other Ambulatory Visit: Payer: Self-pay | Admitting: Internal Medicine

## 2013-12-18 ENCOUNTER — Encounter: Payer: Self-pay | Admitting: Internal Medicine

## 2013-12-18 ENCOUNTER — Ambulatory Visit (INDEPENDENT_AMBULATORY_CARE_PROVIDER_SITE_OTHER): Payer: Medicare Other | Admitting: Internal Medicine

## 2013-12-18 VITALS — BP 114/68 | HR 88 | Temp 98.1°F | Resp 12 | Ht 60.0 in | Wt 160.0 lb

## 2013-12-18 DIAGNOSIS — IMO0001 Reserved for inherently not codable concepts without codable children: Secondary | ICD-10-CM

## 2013-12-18 DIAGNOSIS — E1165 Type 2 diabetes mellitus with hyperglycemia: Secondary | ICD-10-CM | POA: Diagnosis not present

## 2013-12-18 MED ORDER — INSULIN ASPART 100 UNIT/ML FLEXPEN: PEN_INJECTOR | SUBCUTANEOUS | Status: DC

## 2013-12-18 MED ORDER — INSULIN GLARGINE 300 UNIT/ML ~~LOC~~ SOPN: 60.0000 [IU] | PEN_INJECTOR | Freq: Every day | SUBCUTANEOUS | Status: DC

## 2013-12-18 NOTE — Progress Notes (Signed)
Patient ID: Sarah [name], female   DOB: 08-Mar-1945, 68 y.o.   MRN: [id number]  HPI: Sarah [name] is a 68 y.o.-year-old female, referred by her PCP, Dr. Shawna Orleans, for management of DM2, dx 1990s insulin-dependent since 2011, uncontrolled, without complications.  Last hemoglobin A1c was: Lab Results  Component Value Date   HGBA1C 13.2 Repeated and verified X2.* 11/02/2013   HGBA1C 14.6* 05/25/2013   HGBA1C 10.8* 02/09/2013   Pt is on a regimen of: - Metformin 1000 mg po bid - Toujeo 40 units qhs  - Novolog 32-36 units tid ac - misses lunchtime dose sometimes  She is in the donut hole.  Pt checks her sugars ~2x a day and they are: - am: 240-300 - 2h after b'fast: n/c - before lunch: n/c - 2h after lunch: n/c - before dinner: 200s - 2h after dinner: 200s - bedtime: n/c - nighttime: n/c No lows. Lowest sugar was 180; she has hypoglycemia awareness at 90.  Highest sugar was 321.  Pt's meals are: - Breakfast: coffee, bagel + cream cheese - Lunch: sandwich, leftovers - Dinner: meat, veggie, potato - Snacks: 3-4 Drinks: some sodas, water + crystal light  - no CKD, last BUN/creatinine:  Lab Results  Component Value Date   BUN 11 11/02/2013   CREATININE 0.6 11/02/2013  On Valsartan. - last set of lipids: Lab Results  Component Value Date   CHOL 116 02/09/2013   HDL 42.90 02/09/2013   LDLCALC 48 02/09/2013   TRIG 125.0 02/09/2013   CHOLHDL 3 02/09/2013  On Lipitor. - last eye exam was in 09/2013. No DR.  - no numbness and tingling in her feet. She sees a Building surveyor.  Pt has FH of DM in mother, father, brother, sister.  ROS: Constitutional: no weight gain/loss,+ fatigue, no subjective hyperthermia/hypothermia Eyes: no blurry vision, no xerophthalmia ENT: no sore throat, no nodules palpated in throat, no dysphagia/odynophagia, no hoarseness Cardiovascular: no CP/SOB/+ palpitations/no leg swelling Respiratory: no cough/+ SOB Gastrointestinal: no  N/V/D/C Musculoskeletal: no muscle/+ joint aches Skin: no rashes Neurological: no tremors/numbness/tingling/dizziness Psychiatric: no depression/anxiety  Past Medical History  Diagnosis Date  . Heart murmur   . Hyperlipidemia   . Type II or unspecified type diabetes mellitus without mention of complication, uncontrolled   . Boils   . HTN (hypertension)   . Hx of adenomatous colonic polyps   . Glaucoma   . Osteoporosis   . Pneumonia    Past Surgical History  Procedure Laterality Date  . Colonoscopy    . Polypectomy    . Abdominal hysterectomy    . Tonsillectomy    . Carpal tunnel release      right  . Knee arthroscopy      right  . Orif ankle fracture Right 03/24/2012    Procedure: OPEN REDUCTION INTERNAL FIXATION (ORIF) ANKLE FRACTURE;  Surgeon: Newt Minion, MD;  Location: [facility];  Service: Orthopedics;  Laterality: Right;  Open Reduction Internal Fixation Right Bimalleolar ankle fracture   History   Social History  . Marital Status: Single    Spouse Name: N/A    Number of Children: 0   Occupational History  . retired   Social History Main Topics  . Smoking status: Current Some Day Smoker: 2 cigs a day  . Smokeless tobacco: Never Used     Comment: smokes occ.   Marland Kitchen Alcohol Use: Yes  . Drug Use: No   Current Outpatient Prescriptions on File Prior to Visit  Medication Sig Dispense Refill  .  amLODipine (NORVASC) 5 MG tablet Take 10 mg by mouth daily.    Marland Kitchen aspirin EC 81 MG tablet Take 81 mg by mouth daily.    Marland Kitchen atorvastatin (LIPITOR) 20 MG tablet Take 1 tablet (20 mg total) by mouth daily. 90 tablet 1  . metFORMIN (GLUCOPHAGE) 1000 MG tablet Take 1 tablet (1,000 mg total) by mouth 2 (two) times daily with a meal. 180 tablet 1  . miconazole (MICOTIN) 200 MG vaginal suppository Place 1 suppository (200 mg total) vaginally at bedtime. 3 suppository 0  . terbinafine (LAMISIL) 250 MG tablet Take 250 mg by mouth daily.    . valsartan-hydrochlorothiazide (DIOVAN-HCT) 320-25  MG per tablet Take 1 tablet by mouth daily. 30 tablet 5  . Vitamin D, Ergocalciferol, (DRISDOL) 50000 UNITS CAPS capsule TAKE 1 CAPSULE BY MOUTH TWICE DAILY ON MONDAY AND THURSDAY 8 capsule 0  Also, see HPI for diabetic meds.  No Known Allergies   Family History  Problem Relation Age of Onset  . Colon cancer Neg Hx   . Esophageal cancer Neg Hx   . Rectal cancer Neg Hx   . Stomach cancer Neg Hx   . Diabetes Mother   . Hypertension Mother    PE: BP 114/68 mmHg  Pulse 88  Temp(Src) 98.1 F (36.7 C) (Oral)  Resp 12  Ht 5' (1.524 m)  Wt 160 lb (72.576 kg)  BMI 31.25 kg/m2  SpO2 96% Wt Readings from Last 3 Encounters:  12/18/13 160 lb (72.576 kg)  11/09/13 156 lb (70.761 kg)  09/28/13 155 lb (70.308 kg)   Constitutional: overweight, in NAD Eyes: PERRLA, EOMI, no exophthalmos ENT: moist mucous membranes, no thyromegaly, no cervical lymphadenopathy Cardiovascular: RRR, No MRG Respiratory: CTA B Gastrointestinal: abdomen soft, NT, ND, BS+ Musculoskeletal: no deformities, strength intact in all 4 Skin: moist, warm, no rashes Neurological: no tremor with outstretched hands, DTR normal in all 4  ASSESSMENT: 1. DM2, insulin-dependent, uncontrolled, without complications  PLAN:  1. Patient with long-standing, uncontrolled diabetes, on oral antidiabetic regimen + basal bolus insulin regimen, which is insufficient. We discussed about the fact that an efficient basal bolus insulin regimen will have approximately the same amount of basal and bolus insulin per day. She is now taking 40 units of Toujeo and 60-100 units of NovoLog in the day. We will increase Toujeo to 60 units for now. We discussed about the possibility of adding Invokana, however this is not covered by her current Medicare plan but I hope this might be covered by the Medicare complete Lamm that she will get since January. We also discussed about the possibility of switching to U500 insulin, which I anticipate that we will  need to do fairly soon. - We discussed about options for treatment, and I suggested to:  Patient Instructions  Please continue metformin 1000 mg twice a day. Increase Toujeo to 50 units, and, in 4 days, if the sugars in the morning are not lower than 150, increase to 60 units. Use the following doses of NovoLog: 32 units for a small meal 36 units for a medium meal 40 units for a large meal  Start checking her sugars 3 times a day as discussed. Include some checks at bedtime.  Continue working on your diet and include exercise in your daily routine. Even daily 20 minutes of walking at the higher pace can help.  Please return in 1 month with your sugar log.   - Strongly advised her to start checking sugars at different times of  the day - check 3 times a day, rotating checks - given sugar log and advised how to fill it and to bring it at next appt  - given foot care handout and explained the principles  - given instructions for hypoglycemia management "15-15 rule"  - advised for yearly eye exams - she is up-to-date - She got the flu vaccine this season - Return to clinic in 1 mo with sugar log

## 2013-12-18 NOTE — Patient Instructions (Signed)
Please continue metformin 1000 mg twice a day. Increase to show to 50 units, and, in 4 days, if the sugars in the morning are not lower than 150, increase to 60 units. Use the following doses of NovoLog: 32 units for a small meal 36 units for a medium meal 40 units for a large meal  Start checking her sugars 3 times a day as discussed. Include some checks at bedtime.  Continue working on your diet and include exercise in your daily routine. Even daily 20 minutes of walking at the higher pace can help.  Please return in 1 month with your sugar log.   PATIENT INSTRUCTIONS FOR TYPE 2 DIABETES:  **Please join MyChart!** - see attached instructions about how to join if you have not done so already.  DIET AND EXERCISE Diet and exercise is an important part of diabetic treatment.  We recommended aerobic exercise in the form of brisk walking (working between 40-60% of maximal aerobic capacity, similar to brisk walking) for 150 minutes per week (such as 30 minutes five days per week) along with 3 times per week performing 'resistance' training (using various gauge rubber tubes with handles) 5-10 exercises involving the major muscle groups (upper body, lower body and core) performing 10-15 repetitions (or near fatigue) each exercise. Start at half the above goal but build slowly to reach the above goals. If limited by weight, joint pain, or disability, we recommend daily walking in a swimming pool with water up to waist to reduce pressure from joints while allow for adequate exercise.    BLOOD GLUCOSES Monitoring your blood glucoses is important for continued management of your diabetes. Please check your blood glucoses 2-4 times a day: fasting, before meals and at bedtime (you can rotate these measurements - e.g. one day check before the 3 meals, the next day check before 2 of the meals and before bedtime, etc.).   HYPOGLYCEMIA (low blood sugar) Hypoglycemia is usually a reaction to not eating,  exercising, or taking too much insulin/ other diabetes drugs.  Symptoms include tremors, sweating, hunger, confusion, headache, etc. Treat IMMEDIATELY with 15 grams of Carbs: . 4 glucose tablets .  cup regular juice/soda . 2 tablespoons raisins . 4 teaspoons sugar . 1 tablespoon honey Recheck blood glucose in 15 mins and repeat above if still symptomatic/blood glucose <100.  RECOMMENDATIONS TO REDUCE YOUR RISK OF DIABETIC COMPLICATIONS: * Take your prescribed MEDICATION(S) * Follow a DIABETIC diet: Complex carbs, fiber rich foods, (monounsaturated and polyunsaturated) fats * AVOID saturated/trans fats, high fat foods, >2,300 mg salt per day. * EXERCISE at least 5 times a week for 30 minutes or preferably daily.  * DO NOT SMOKE OR DRINK more than 1 drink a day. * Check your FEET every day. Do not wear tightfitting shoes. Contact us if you develop an ulcer * See your EYE doctor once a year or more if needed * Get a FLU shot once a year * Get a PNEUMONIA vaccine once before and once after age 17 years  GOALS:  * Your Hemoglobin A1c of <7%  * fasting sugars need to be <130 * after meals sugars need to be <180 (2h after you start eating) * Your Systolic BP should be 741 or lower  * Your Diastolic BP should be 80 or lower  * Your HDL (Good Cholesterol) should be 40 or higher  * Your LDL (Bad Cholesterol) should be 100 or lower. * Your Triglycerides should be 150 or lower  * Your  Urine microalbumin (kidney function) should be <30 * Your Body Mass Index should be 25 or lower    Please consider the following ways to cut down carbs and fat and increase fiber and micronutrients in your diet: - substitute whole grain for white bread or pasta - substitute brown rice for white rice - substitute 90-calorie flat bread pieces for slices of bread when possible - substitute sweet potatoes or yams for white potatoes - substitute humus for margarine - substitute tofu for cheese when possible -  substitute almond or rice milk for regular milk (would not drink soy milk daily due to concern for soy estrogen influence on breast cancer risk) - substitute dark chocolate for other sweets when possible - substitute water - can add lemon or orange slices for taste - for diet sodas (artificial sweeteners will trick your body that you can eat sweets without getting calories and will lead you to overeating and weight gain in the long run) - do not skip breakfast or other meals (this will slow down the metabolism and will result in more weight gain over time)  - can try smoothies made from fruit and almond/rice milk in am instead of regular breakfast - can also try old-fashioned (not instant) oatmeal made with almond/rice milk in am - order the dressing on the side when eating salad at a restaurant (pour less than half of the dressing on the salad) - eat as little meat as possible - can try juicing, but should not forget that juicing will get rid of the fiber, so would alternate with eating raw veg./fruits or drinking smoothies - use as little oil as possible, even when using olive oil - can dress a salad with a mix of balsamic vinegar and lemon juice, for e.g. - use agave nectar, stevia sugar, or regular sugar rather than artificial sweateners - steam or broil/roast veggies  - snack on veggies/fruit/nuts (unsalted, preferably) when possible, rather than processed foods - reduce or eliminate aspartame in diet (it is in diet sodas, chewing gum, etc) Read the labels!  Try to read Dr. Janene Harvey book: "Program for Reversing Diabetes" for other ideas for healthy eating.

## 2014-01-21 ENCOUNTER — Ambulatory Visit: Payer: Medicare Other | Admitting: Internal Medicine

## 2014-02-11 ENCOUNTER — Ambulatory Visit: Payer: Medicare Other | Admitting: Internal Medicine

## 2014-02-12 ENCOUNTER — Ambulatory Visit (INDEPENDENT_AMBULATORY_CARE_PROVIDER_SITE_OTHER): Payer: Medicare Other | Admitting: Internal Medicine

## 2014-02-12 ENCOUNTER — Encounter: Payer: Self-pay | Admitting: Internal Medicine

## 2014-02-12 VITALS — BP 126/64 | HR 84 | Temp 98.2°F | Resp 12 | Wt 154.0 lb

## 2014-02-12 DIAGNOSIS — E1165 Type 2 diabetes mellitus with hyperglycemia: Secondary | ICD-10-CM | POA: Diagnosis not present

## 2014-02-12 DIAGNOSIS — Z794 Long term (current) use of insulin: Principal | ICD-10-CM

## 2014-02-12 DIAGNOSIS — IMO0002 Reserved for concepts with insufficient information to code with codable children: Secondary | ICD-10-CM

## 2014-02-12 LAB — HEMOGLOBIN A1C: Hgb A1c MFr Bld: 13.8 % — ABNORMAL HIGH (ref 4.6–6.5)

## 2014-02-12 MED ORDER — INSULIN GLARGINE 300 UNIT/ML ~~LOC~~ SOPN: 70.0000 [IU] | PEN_INJECTOR | Freq: Every day | SUBCUTANEOUS | Status: DC

## 2014-02-12 MED ORDER — INSULIN ASPART 100 UNIT/ML FLEXPEN: PEN_INJECTOR | SUBCUTANEOUS | Status: DC

## 2014-02-12 NOTE — Patient Instructions (Signed)
Please continue: - Metformin 1000 mg 2x a day  Please increase: - Toujeoto 70 units at bedtime - Novolog to 40 units before a meal  Do not skip doses.  Please inject NovoLog 15 min before a meal.  Please stop at the lab.  Please return in 1 month with your sugar log.
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
## 2014-03-07 ENCOUNTER — Encounter: Payer: Self-pay | Admitting: Internal Medicine

## 2014-03-07 ENCOUNTER — Other Ambulatory Visit: Payer: Self-pay | Admitting: *Deleted

## 2014-03-07 MED ORDER — INSULIN LISPRO 100 UNIT/ML (KWIKPEN): PEN_INJECTOR | SUBCUTANEOUS | Status: DC

## 2014-03-07 NOTE — Telephone Encounter (Signed)
Pt sent message via MyChart requesting a medication change from Novolog to Humalog due to insurance. Ok'd by Dr Cruzita Lederer.

## 2014-03-15 ENCOUNTER — Ambulatory Visit (INDEPENDENT_AMBULATORY_CARE_PROVIDER_SITE_OTHER): Payer: Medicare Other | Admitting: Internal Medicine

## 2014-03-15 ENCOUNTER — Ambulatory Visit: Payer: Medicare Other | Admitting: Internal Medicine

## 2014-03-15 ENCOUNTER — Encounter: Payer: Self-pay | Admitting: Internal Medicine

## 2014-03-15 VITALS — BP 140/84 | HR 64 | Temp 98.1°F | Wt 158.0 lb

## 2014-03-15 DIAGNOSIS — I10 Essential (primary) hypertension: Secondary | ICD-10-CM | POA: Diagnosis not present

## 2014-03-15 DIAGNOSIS — Z23 Encounter for immunization: Secondary | ICD-10-CM

## 2014-03-15 DIAGNOSIS — E1165 Type 2 diabetes mellitus with hyperglycemia: Secondary | ICD-10-CM

## 2014-03-15 DIAGNOSIS — E785 Hyperlipidemia, unspecified: Secondary | ICD-10-CM

## 2014-03-15 DIAGNOSIS — IMO0002 Reserved for concepts with insufficient information to code with codable children: Secondary | ICD-10-CM

## 2014-03-15 MED ORDER — METFORMIN HCL 1000 MG PO TABS: 1000.0000 mg | ORAL_TABLET | Freq: Two times a day (BID) | ORAL | Status: DC

## 2014-03-15 MED ORDER — ATORVASTATIN CALCIUM 20 MG PO TABS: 20.0000 mg | ORAL_TABLET | Freq: Every day | ORAL | Status: DC

## 2014-03-15 MED ORDER — INSULIN LISPRO 100 UNIT/ML (KWIKPEN): PEN_INJECTOR | SUBCUTANEOUS | Status: DC

## 2014-03-15 MED ORDER — VALSARTAN-HYDROCHLOROTHIAZIDE 320-25 MG PO TABS: 1.0000 | ORAL_TABLET | Freq: Every day | ORAL | Status: DC

## 2014-03-15 MED ORDER — AMLODIPINE BESYLATE 10 MG PO TABS: 10.0000 mg | ORAL_TABLET | Freq: Every day | ORAL | Status: DC

## 2014-03-15 NOTE — Progress Notes (Signed)
Pre visit review using our clinic review tool, if applicable. No additional management support is needed unless otherwise documented below in the visit note. 

## 2014-03-15 NOTE — Assessment & Plan Note (Signed)
Followed by endocrinology. Her blood sugars improving with higher doses of mealtime insulin. Cost of insulin therapy becoming an issue.

## 2014-03-15 NOTE — Progress Notes (Signed)
Subjective:    Patient ID: Sarah [name], female    DOB: 30-Dec-1945, 69 y.o.   MRN: [id number]  HPI  69 year old African-American female with history of uncontrolled type 2 diabetes, hypertension and hyperlipidemia for follow-up. Interval medical history she has established with endocrinologist. She is using much higher doses of mealtime insulin. Her blood sugars improving. Cost of insulin becoming an issue.  Hypertension-stable. She reports good medication compliance.  Hyperlipidemia - stable  She reports higher stress levels.  Her father in nursing home.  He has multiple health problems including dementia.     Review of Systems Negative for chest pain or shortness of breath    Past Medical History  Diagnosis Date  . Heart murmur   . Hyperlipidemia   . Type II or unspecified type diabetes mellitus without mention of complication, uncontrolled   . Boils   . HTN (hypertension)   . Hx of adenomatous colonic polyps   . Glaucoma   . Osteoporosis   . Pneumonia     History   Social History  . Marital Status: Single    Spouse Name: N/A  . Number of Children: N/A  . Years of Education: N/A   Occupational History  . Not on file.   Social History Main Topics  . Smoking status: Current Some Day Smoker  . Smokeless tobacco: Never Used     Comment: smokes occ.   Marland Kitchen Alcohol Use: Yes  . Drug Use: No  . Sexual Activity: Not on file   Other Topics Concern  . Not on file   Social History Narrative    Past Surgical History  Procedure Laterality Date  . Colonoscopy    . Polypectomy    . Abdominal hysterectomy    . Tonsillectomy    . Carpal tunnel release      right  . Knee arthroscopy      right  . Orif ankle fracture Right 03/24/2012    Procedure: OPEN REDUCTION INTERNAL FIXATION (ORIF) ANKLE FRACTURE;  Surgeon: Newt Minion, MD;  Location: [place];  Service: Orthopedics;  Laterality: Right;  Open Reduction Internal Fixation Right Bimalleolar ankle fracture     Family History  Problem Relation Age of Onset  . Colon cancer Neg Hx   . Esophageal cancer Neg Hx   . Rectal cancer Neg Hx   . Stomach cancer Neg Hx   . Diabetes Mother   . Hypertension Mother     No Known Allergies  Current Outpatient Prescriptions on File Prior to Visit  Medication Sig Dispense Refill  . amLODipine (NORVASC) 5 MG tablet Take 10 mg by mouth daily.    Marland Kitchen aspirin EC 81 MG tablet Take 81 mg by mouth daily.    Marland Kitchen atorvastatin (LIPITOR) 20 MG tablet Take 1 tablet (20 mg total) by mouth daily. 90 tablet 1  . B-D ULTRAFINE III SHORT PEN 31G X 8 MM MISC   11  . Insulin Glargine (TOUJEO SOLOSTAR) 300 UNIT/ML SOPN Inject 70 Units into the skin daily. 3 pen 1  . metFORMIN (GLUCOPHAGE) 1000 MG tablet Take 1 tablet (1,000 mg total) by mouth 2 (two) times daily with a meal. 180 tablet 1  . miconazole (MICOTIN) 200 MG vaginal suppository Place 1 suppository (200 mg total) vaginally at bedtime. 3 suppository 0  . valsartan-hydrochlorothiazide (DIOVAN-HCT) 320-25 MG per tablet Take 1 tablet by mouth daily. 30 tablet 5  . Vitamin D, Ergocalciferol, (DRISDOL) 50000 UNITS CAPS capsule TAKE 1 CAPSULE BY MOUTH TWICE  DAILY ON MONDAY AND THURSDAY 8 capsule 0   No current facility-administered medications on file prior to visit.    BP 140/84 mmHg  Pulse 64  Temp(Src) 98.1 F (36.7 C) (Oral)  Wt 158 lb (71.668 kg)    Objective:   Physical Exam  Constitutional: She is oriented to person, place, and time. She appears well-developed and well-nourished. No distress.  HENT:  Head: Normocephalic and atraumatic.  Cardiovascular: Normal rate, regular rhythm and normal heart sounds.   Pulmonary/Chest: Effort normal and breath sounds normal. She has no wheezes.  Musculoskeletal: She exhibits no edema.  Neurological: She is alert and oriented to person, place, and time.  Skin: Skin is warm and dry.  Psychiatric: She has a normal mood and affect. Her behavior is normal.           Assessment & Plan:

## 2014-03-15 NOTE — Assessment & Plan Note (Signed)
Monitor FLP and LFTs before next OV.

## 2014-03-15 NOTE — Patient Instructions (Addendum)
Monitor your blood pressure at home as directed Please complete the following lab tests before your next follow up appointment: BMET, A1c - 250.02 FLP, LFTs - 272.4

## 2014-03-15 NOTE — Assessment & Plan Note (Signed)
BP is sporadically elevated today. It may related to life stressors. Patient advised to monitor BP at home.  Patient understands cold BP less than 130/80.  BP: 140/84 mmHg

## 2014-03-18 ENCOUNTER — Ambulatory Visit: Payer: Medicare Other | Admitting: Internal Medicine

## 2014-04-04 DIAGNOSIS — Z1231 Encounter for screening mammogram for malignant neoplasm of breast: Secondary | ICD-10-CM | POA: Diagnosis not present

## 2014-04-04 DIAGNOSIS — Z803 Family history of malignant neoplasm of breast: Secondary | ICD-10-CM | POA: Diagnosis not present

## 2014-04-04 LAB — HM MAMMOGRAPHY: HM Mammogram: NORMAL

## 2014-04-08 ENCOUNTER — Ambulatory Visit: Payer: Medicare Other | Admitting: Internal Medicine

## 2014-04-09 ENCOUNTER — Encounter: Payer: Self-pay | Admitting: Internal Medicine

## 2014-04-12 ENCOUNTER — Other Ambulatory Visit: Payer: Self-pay | Admitting: Internal Medicine

## 2014-04-18 DIAGNOSIS — D2372 Other benign neoplasm of skin of left lower limb, including hip: Secondary | ICD-10-CM | POA: Diagnosis not present

## 2014-05-11 ENCOUNTER — Other Ambulatory Visit: Payer: Self-pay | Admitting: Internal Medicine

## 2014-05-20 ENCOUNTER — Ambulatory Visit: Payer: Medicare Other | Admitting: Internal Medicine

## 2014-05-20 DIAGNOSIS — Z0289 Encounter for other administrative examinations: Secondary | ICD-10-CM

## 2014-05-23 ENCOUNTER — Telehealth: Payer: Self-pay | Admitting: *Deleted

## 2014-05-23 NOTE — Telephone Encounter (Signed)
Called pt, Dr Shawna [place] wanted her to have a follow up with Dr Cruzita Lederer and call her insurance company about the shingles vaccine.  Pt stated that she had an appt with Dr Cruzita Lederer on 05/30/14 but she didn't make it so she will call her office to reschedule.  She will also call her insuance comapney for the shingles.  She has an appt on 06/14/14 so I told her if her insurance cover shingles she will be getting that and tdap.  She verbalized understanding and had no questions

## 2014-05-28 ENCOUNTER — Encounter: Payer: Self-pay | Admitting: Internal Medicine

## 2014-05-29 ENCOUNTER — Ambulatory Visit: Payer: Medicare Other | Admitting: Internal Medicine

## 2014-06-05 ENCOUNTER — Other Ambulatory Visit: Payer: Self-pay | Admitting: *Deleted

## 2014-06-05 ENCOUNTER — Ambulatory Visit (INDEPENDENT_AMBULATORY_CARE_PROVIDER_SITE_OTHER): Payer: Medicare Other | Admitting: Internal Medicine

## 2014-06-05 ENCOUNTER — Encounter: Payer: Self-pay | Admitting: Internal Medicine

## 2014-06-05 VITALS — BP 118/62 | HR 98 | Temp 97.6°F | Resp 12 | Wt 162.0 lb

## 2014-06-05 DIAGNOSIS — E1165 Type 2 diabetes mellitus with hyperglycemia: Secondary | ICD-10-CM | POA: Diagnosis not present

## 2014-06-05 MED ORDER — INSULIN REGULAR HUMAN (CONC) 500 UNIT/ML ~~LOC~~ SOPN
40.0000 [IU] | PEN_INJECTOR | Freq: Three times a day (TID) | SUBCUTANEOUS | Status: DC
Start: 2014-06-05 — End: 2014-10-14

## 2014-06-05 MED ORDER — METFORMIN HCL 1000 MG PO TABS: 1000.0000 mg | ORAL_TABLET | Freq: Two times a day (BID) | ORAL | Status: DC

## 2014-06-05 NOTE — Patient Instructions (Signed)
Please continue Metformin 1000 mg 2x a day.   Please stop Toujeo and NovoLog and start U500 insulin: 40 units 3x a day, 30 min before a meal  Call me with sugars on Friday after lunch.  Please get a Hba1c checked on Friday.  Please come back for a follow-up appointment in 1.5 months.

## 2014-06-05 NOTE — Progress Notes (Signed)
Patient ID: Sarah [name], female   DOB: 1945/12/23, 69 y.o.   MRN: [id number]  HPI: [name] is a 69 y.o.-year-old female, returning for f/u for DM2, dx 1990s insulin-dependent since 2011, uncontrolled, without complications. Last visit 3.5 mo ago.  Last hemoglobin A1c was: Lab Results  Component Value Date   HGBA1C 13.8* 02/12/2014   HGBA1C 13.2 Repeated and verified X2.* 11/02/2013   HGBA1C 14.6* 05/25/2013   Pt is on a regimen of: - Metformin 1000 mg po bid - Toujeo 40 >> 60 >> 70 units qhs  - Novolog - may miss lunch 32 units for a small meal 36 units for a medium meal 40 units for a large meal She ends up with: B: 32, L: 32, D: 38  She had to stretch the insulin >> sugars higher then. She now has meet the deductible.     Pt checks her sugars ~2x a day and they are (no log >> does not write them down, no meter): - am: 240-300 >> 180-211 >> 150-200, 217 - 2h after b'fast: n/c - before lunch: n/c - 2h after lunch: n/c - before dinner: 200s >> 200-300 >> 240-300 - 2h after dinner: 200s >> 210 >> 200-300 - bedtime: n/c - nighttime: n/c No lows. Lowest sugar was 150; she has hypoglycemia awareness at 90.  Highest sugar was 300s.  Pt's meals are: - Breakfast: coffee, bagel + cream cheese - Lunch: sandwich, leftovers - Dinner: meat, veggie, potato - Snacks: 3-4 Drinks: some sodas, water + crystal light  - no CKD, last BUN/creatinine:  Lab Results  Component Value Date   BUN 11 11/02/2013   CREATININE 0.6 11/02/2013  On Valsartan. - last set of lipids: Lab Results  Component Value Date   CHOL 116 02/09/2013   HDL 42.90 02/09/2013   LDLCALC 48 02/09/2013   TRIG 125.0 02/09/2013   CHOLHDL 3 02/09/2013  On Lipitor. - last eye exam was in 09/2013. No DR.  - no numbness and tingling in her feet. She sees a Building surveyor.  ROS: Constitutional: no weight gain/loss, no fatigue, no subjective hyperthermia/hypothermia Eyes: no blurry vision, no  xerophthalmia ENT: no sore throat, no nodules palpated in throat, no dysphagia/odynophagia, no hoarseness Cardiovascular: no CP/SOB/palpitations/no leg swelling Respiratory: no cough/no SOB Gastrointestinal: no N/V/D/C Musculoskeletal: no muscle/joint aches Skin: no rashes Neurological: no tremors/numbness/tingling/dizziness  I reviewed pt's medications, allergies, PMH, social hx, family hx, and changes were documented in the history of present illness. Otherwise, unchanged from my initial visit note:  Past Medical History  Diagnosis Date  . Heart murmur   . Hyperlipidemia   . Type II or unspecified type diabetes mellitus without mention of complication, uncontrolled   . Boils   . HTN (hypertension)   . Hx of adenomatous colonic polyps   . Glaucoma   . Osteoporosis   . Pneumonia    Past Surgical History  Procedure Laterality Date  . Colonoscopy    . Polypectomy    . Abdominal hysterectomy    . Tonsillectomy    . Carpal tunnel release      right  . Knee arthroscopy      right  . Orif ankle fracture Right 03/24/2012    Procedure: OPEN REDUCTION INTERNAL FIXATION (ORIF) ANKLE FRACTURE;  Surgeon: Newt Minion, MD;  Location: [place];  Service: Orthopedics;  Laterality: Right;  Open Reduction Internal Fixation Right Bimalleolar ankle fracture   History   Social History  . Marital Status: Single  Spouse Name: N/A    Number of Children: 0   Occupational History  . retired   Social History Main Topics  . Smoking status: Current Some Day Smoker: 2 cigs a day  . Smokeless tobacco: Never Used     Comment: smokes occ.   Marland Kitchen Alcohol Use: Yes  . Drug Use: No   Current Outpatient Prescriptions on File Prior to Visit  Medication Sig Dispense Refill  . amLODipine (NORVASC) 5 MG tablet Take 10 mg by mouth daily.    Marland Kitchen aspirin EC 81 MG tablet Take 81 mg by mouth daily.    Marland Kitchen atorvastatin (LIPITOR) 20 MG tablet Take 1 tablet (20 mg total) by mouth daily. 90 tablet 1  . metFORMIN  (GLUCOPHAGE) 1000 MG tablet Take 1 tablet (1,000 mg total) by mouth 2 (two) times daily with a meal. 180 tablet 1  . miconazole (MICOTIN) 200 MG vaginal suppository Place 1 suppository (200 mg total) vaginally at bedtime. 3 suppository 0  . terbinafine (LAMISIL) 250 MG tablet Take 250 mg by mouth daily.    . valsartan-hydrochlorothiazide (DIOVAN-HCT) 320-25 MG per tablet Take 1 tablet by mouth daily. 30 tablet 5  . Vitamin D, Ergocalciferol, (DRISDOL) 50000 UNITS CAPS capsule TAKE 1 CAPSULE BY MOUTH TWICE DAILY ON MONDAY AND THURSDAY 8 capsule 0  Also, see HPI for diabetic meds.  No Known Allergies   Family History  Problem Relation Age of Onset  . Colon cancer Neg Hx   . Esophageal cancer Neg Hx   . Rectal cancer Neg Hx   . Stomach cancer Neg Hx   . Diabetes Mother   . Hypertension Mother    PE: BP 118/62 mmHg  Pulse 98  Temp(Src) 97.6 F (36.4 C) (Oral)  Resp 12  Wt 162 lb (73.483 kg)  SpO2 96% Body mass index is 31.64 kg/(m^2).  Wt Readings from Last 3 Encounters:  06/05/14 162 lb (73.483 kg)  03/15/14 158 lb (71.668 kg)  02/12/14 154 lb (69.854 kg)   Constitutional: overweight, in NAD Eyes: PERRLA, EOMI, no exophthalmos ENT: moist mucous membranes, no thyromegaly, no cervical lymphadenopathy Cardiovascular: RRR, No MRG Respiratory: CTA B Gastrointestinal: abdomen soft, NT, ND, BS+ Musculoskeletal: no deformities, strength intact in all 4 Skin: moist, warm, no rashes Neurological: no tremor with outstretched hands, DTR normal in all 4  ASSESSMENT: 1. DM2, insulin-dependent, uncontrolled, without complications  PLAN:  1. Patient with long-standing, uncontrolled diabetes, on oral antidiabetic regimen + basal bolus insulin regimen, with still very poor control, despite a very large dose of insulin daily, 190 units.   At this point, I suggested that we start U500 insulin and she agrees. We will try to have her use a pen, if her insurance covers it. We will start with a  lower dose than calculated from the U100 insulin that she is using now to avoid hypoglycemia, I advised her that her sugars may be higher but in that case, we'll need to titrate the dose up. She will contact me in 2 days with her sugars. I demonstrated the use of the pen and gave her coupons and discount cards for it. -  I suggested to:  Patient Instructions  Please continue Metformin 1000 mg 2x a day.   Please stop Toujeo and NovoLog and start U500 insulin: 40 units 3x a day, 30 min before a meal  Call me with sugars on Friday after lunch.  Please get a Hba1c checked on Friday.  Please come back for a follow-up  appointment in 1.5 months.  - continue checking sugars at different times of the day - check 3 times a day, rotating checks - Will check a hemoglobin A1c at the next visit with PCP in 2 days - advised for yearly eye exams - she is up-to-date - Return to clinic in 1.5 mo with sugar log

## 2014-06-07 ENCOUNTER — Other Ambulatory Visit: Payer: Medicare Other

## 2014-06-07 ENCOUNTER — Encounter: Payer: Self-pay | Admitting: Internal Medicine

## 2014-06-14 ENCOUNTER — Ambulatory Visit: Payer: Medicare Other | Admitting: Internal Medicine

## 2014-07-08 ENCOUNTER — Other Ambulatory Visit: Payer: Self-pay

## 2014-07-11 ENCOUNTER — Other Ambulatory Visit: Payer: Medicare Other

## 2014-07-11 ENCOUNTER — Encounter (HOSPITAL_COMMUNITY): Payer: Self-pay | Admitting: Emergency Medicine

## 2014-07-11 ENCOUNTER — Emergency Department (INDEPENDENT_AMBULATORY_CARE_PROVIDER_SITE_OTHER)
Admission: EM | Admit: 2014-07-11 | Discharge: 2014-07-11 | Disposition: A | Discharge status: Home / Self Care | Payer: Medicare Other | Source: Home / Self Care | Attending: Family Medicine | Admitting: Family Medicine

## 2014-07-11 DIAGNOSIS — K047 Periapical abscess without sinus: Secondary | ICD-10-CM

## 2014-07-11 MED ORDER — FLUCONAZOLE 150 MG PO TABS: 150.0000 mg | ORAL_TABLET | Freq: Every day | ORAL | Status: DC

## 2014-07-11 MED ORDER — KETOROLAC TROMETHAMINE 60 MG/2ML IM SOLN: 30.0000 mg | Freq: Once | INTRAMUSCULAR | Status: DC

## 2014-07-11 MED ORDER — AMOXICILLIN 500 MG PO CAPS: 500.0000 mg | ORAL_CAPSULE | Freq: Three times a day (TID) | ORAL | Status: DC

## 2014-07-11 MED ORDER — TRAMADOL HCL 50 MG PO TABS: 50.0000 mg | ORAL_TABLET | Freq: Four times a day (QID) | ORAL | Status: DC | PRN

## 2014-07-11 MED ORDER — KETOROLAC TROMETHAMINE 60 MG/2ML IM SOLN
INTRAMUSCULAR | Status: AC
  Filled 2014-07-11: qty 2

## 2014-07-11 MED ORDER — KETOROLAC TROMETHAMINE 60 MG/2ML IM SOLN
60.0000 mg | Freq: Once | INTRAMUSCULAR | Status: AC
Start: 2014-07-11 — End: 2014-07-11
  Administered 2014-07-11: 60 mg via INTRAMUSCULAR

## 2014-07-11 NOTE — ED Notes (Signed)
C/o dental pain top and bottom left side x 2 days.  Having no relief with otc pain meds.

## 2014-07-11 NOTE — ED Provider Notes (Signed)
CSN: [id number]     Arrival date & time 07/11/14  1432 History   First MD Initiated Contact with Patient 07/11/14 1534     Chief Complaint  Patient presents with  . Dental Pain   (Consider location/radiation/quality/duration/timing/severity/associated sxs/prior Treatment) HPI  Dental pain. Left lower and upper jaws. Numerous cavitated teeth. Some soft tissue swelling. Denies purulent or hemorrhagic discharge, felt taste, but we will go nausea, vomiting, neck stiffness, headache. Symptoms are constant and getting worse. Patient was unable sleep last night due to the pain. Patient is taken 125 mg of Tylenol with minimal permit an 846 mg of ibuprofen with significant improvement but this does not last very long per patient.  Past Medical History  Diagnosis Date  . Heart murmur   . Hyperlipidemia   . Type II or unspecified type diabetes mellitus without mention of complication, uncontrolled   . Boils   . HTN (hypertension)   . Hx of adenomatous colonic polyps   . Glaucoma   . Osteoporosis   . Pneumonia    Past Surgical History  Procedure Laterality Date  . Colonoscopy    . Polypectomy    . Abdominal hysterectomy    . Tonsillectomy    . Carpal tunnel release      right  . Knee arthroscopy      right  . Orif ankle fracture Right 03/24/2012    Procedure: OPEN REDUCTION INTERNAL FIXATION (ORIF) ANKLE FRACTURE;  Surgeon: Newt Minion, MD;  Location: [facility];  Service: Orthopedics;  Laterality: Right;  Open Reduction Internal Fixation Right Bimalleolar ankle fracture   Family History  Problem Relation Age of Onset  . Colon cancer Neg Hx   . Esophageal cancer Neg Hx   . Rectal cancer Neg Hx   . Stomach cancer Neg Hx   . Diabetes Mother   . Hypertension Mother    History  Substance Use Topics  . Smoking status: Current Some Day Smoker  . Smokeless tobacco: Never Used     Comment: smokes occ.   Marland Kitchen Alcohol Use: Yes   OB History    No data available     Review of Systems Per  HPI with all other pertinent systems negative.   Allergies  Review of patient's allergies indicates no known allergies.  Home Medications   Prior to Admission medications   Medication Sig Start Date End Date Taking? Authorizing Provider  amLODipine (NORVASC) 10 MG tablet Take 1 tablet (10 mg total) by mouth daily. 03/15/14  Yes Doe-Hyun R Shawna Orleans, DO  aspirin EC 81 MG tablet Take 81 mg by mouth daily.   Yes Historical Provider, MD  Insulin Regular Human, Conc, (HUMULIN R U-500 KWIKPEN) 500 UNIT/ML SOPN Inject 40 Units into the skin 3 (three) times daily with meals. 06/05/14  Yes Philemon Kingdom, MD  metFORMIN (GLUCOPHAGE) 1000 MG tablet Take 1 tablet (1,000 mg total) by mouth 2 (two) times daily with a meal. 06/05/14  Yes Philemon Kingdom, MD  valsartan-hydrochlorothiazide (DIOVAN-HCT) 320-25 MG per tablet Take 1 tablet by mouth daily. 03/15/14  Yes Doe-Hyun R Shawna Orleans, DO  amoxicillin (AMOXIL) 500 MG capsule Take 1 capsule (500 mg total) by mouth 3 (three) times daily. 07/11/14   Waldemar Dickens, MD  atorvastatin (LIPITOR) 20 MG tablet Take 1 tablet (20 mg total) by mouth daily. 03/15/14   Doe-Hyun R Shawna Orleans, DO  B-D ULTRAFINE III SHORT PEN 31G X 8 MM MISC USE FOUR TIMES DAILY AS DIRECTED 05/13/14   Doe-Hyun Kyra Searles,  DO  fluconazole (DIFLUCAN) 150 MG tablet Take 1 tablet (150 mg total) by mouth daily. Repeat dose in 3 days 07/11/14   Waldemar Dickens, MD  miconazole (MICOTIN) 200 MG vaginal suppository Place 1 suppository (200 mg total) vaginally at bedtime. 03/23/13   Doe-Hyun R Shawna Orleans, DO  traMADol (ULTRAM) 50 MG tablet Take 1 tablet (50 mg total) by mouth every 6 (six) hours as needed. 07/11/14   Waldemar Dickens, MD  Vitamin D, Ergocalciferol, (DRISDOL) 50000 UNITS CAPS capsule TAKE 1 CAPSULE BY MOUTH TWICE A WEEK ON MONDAY, AND THURSDAY 04/12/14   Doe-Hyun R Yoo, DO   BP 200/106 mmHg  Pulse 112  Temp(Src) 97 F (36.1 C) (Oral)  Resp 16  SpO2 97% Physical Exam Physical Exam  Constitutional: oriented to person,  place, and time. appears well-developed and well-nourished. No distress.  HENT:  Second and third left mandibular molars cavitated with surrounding soft tissue erythema and few exudative lesions, no fluctuance, or discharge, left second and third mandibular molars with similar periods. Head: Normocephalic and atraumatic.  Eyes: EOMI. PERRL.  Neck: Normal range of motion.  Cardiovascular: RRR, 355 systolic murmur, 2+ distal pulses,  Pulmonary/Chest: Effort normal and breath sounds normal. No respiratory distress.  Abdominal: Soft. Bowel sounds are normal. NonTTP, no distension.  Musculoskeletal: Normal range of motion. Non ttp, no effusion.  Neurological: alert and oriented to person, place, and time.  Skin: Skin is warm. No rash noted. non diaphoretic.  Psychiatric: normal mood and affect. behavior is normal. Judgment and thought content normal.   ED Course  Procedures (including critical care time) Labs Review Labs Reviewed - No data to display  Imaging Review No results found.   MDM   1. Dental infection    Amoxicillin, mouthwash, Toradol 30 mg IM in office, prescription for 5 tramadol given, follow-up with dentist.  Pressure elevation likely secondary to patient not taking her medications as well as acute pain. Patient to restart medications. No evidence of end organ injury.   Waldemar Dickens, MD 07/11/14 [phone]

## 2014-07-11 NOTE — Discharge Instructions (Signed)
You have a dental infection. This will require further management by her dentist. In the meantime please take your antibiotic as prescribed. Please use the Diflucan if you develop a yeast infection and use the tramadol only for severe pain. Your given an injection of medicine which last 24 hours for pain.

## 2014-07-17 ENCOUNTER — Ambulatory Visit: Payer: Medicare Other | Admitting: Internal Medicine

## 2014-08-30 ENCOUNTER — Other Ambulatory Visit (INDEPENDENT_AMBULATORY_CARE_PROVIDER_SITE_OTHER): Payer: Medicare Other

## 2014-08-30 DIAGNOSIS — E119 Type 2 diabetes mellitus without complications: Secondary | ICD-10-CM | POA: Diagnosis not present

## 2014-08-30 DIAGNOSIS — E785 Hyperlipidemia, unspecified: Secondary | ICD-10-CM | POA: Diagnosis not present

## 2014-08-30 LAB — HEPATIC FUNCTION PANEL
ALT: 11 U/L (ref 0–35)
AST: 13 U/L (ref 0–37)
Albumin: 3.8 g/dL (ref 3.5–5.2)
Alkaline Phosphatase: 84 U/L (ref 39–117)
Bilirubin, Direct: 0.1 mg/dL (ref 0.0–0.3)
Total Bilirubin: 0.5 mg/dL (ref 0.2–1.2)
Total Protein: 6.9 g/dL (ref 6.0–8.3)

## 2014-08-30 LAB — LIPID PANEL
Cholesterol: 179 mg/dL (ref 0–200)
HDL: 34.5 mg/dL — ABNORMAL LOW (ref >39.00)
LDL Cholesterol: 106 mg/dL — ABNORMAL HIGH (ref 0–99)
NonHDL: 144.56
Total CHOL/HDL Ratio: 5
Triglycerides: 191 mg/dL — ABNORMAL HIGH (ref 0.0–149.0)
VLDL: 38.2 mg/dL (ref 0.0–40.0)

## 2014-08-30 LAB — BASIC METABOLIC PANEL
BUN: 13 mg/dL (ref 6–23)
CO2: 26 mEq/L (ref 19–32)
Calcium: 9.7 mg/dL (ref 8.4–10.5)
Chloride: 104 mEq/L (ref 96–112)
Creatinine, Ser: 0.63 mg/dL (ref 0.40–1.20)
GFR: 121.19 mL/min (ref >60.00)
Glucose, Bld: 392 mg/dL — ABNORMAL HIGH (ref 70–99)
Potassium: 3.8 mEq/L (ref 3.5–5.1)
Sodium: 139 mEq/L (ref 135–145)

## 2014-08-30 LAB — HEMOGLOBIN A1C: Hgb A1c MFr Bld: 16.3 % — ABNORMAL HIGH (ref 4.6–6.5)

## 2014-09-06 ENCOUNTER — Ambulatory Visit (INDEPENDENT_AMBULATORY_CARE_PROVIDER_SITE_OTHER): Payer: Medicare Other | Admitting: Adult Health

## 2014-09-06 ENCOUNTER — Encounter: Payer: Self-pay | Admitting: Adult Health

## 2014-09-06 ENCOUNTER — Ambulatory Visit: Payer: Medicare Other | Admitting: Internal Medicine

## 2014-09-06 VITALS — BP 124/60 | HR 80 | Temp 98.3°F | Resp 20 | Ht 60.0 in | Wt 141.0 lb

## 2014-09-06 DIAGNOSIS — I10 Essential (primary) hypertension: Secondary | ICD-10-CM

## 2014-09-06 DIAGNOSIS — IMO0002 Reserved for concepts with insufficient information to code with codable children: Secondary | ICD-10-CM

## 2014-09-06 DIAGNOSIS — E1165 Type 2 diabetes mellitus with hyperglycemia: Secondary | ICD-10-CM

## 2014-09-06 DIAGNOSIS — R634 Abnormal weight loss: Secondary | ICD-10-CM

## 2014-09-06 NOTE — Patient Instructions (Addendum)
Call your insurance company when you get home and find out which insulins are on the formulary that is covered by [facility].  Send Dr. Pablo Ledger a message via my chart and explain the situation. See if she has any other recommendations.   Follow up with her regarding the diabetes.   Drink plenty of water.

## 2014-09-06 NOTE — Progress Notes (Signed)
Subjective:    Patient ID: Sarah [name], female    DOB: [birth year], 69 y.o.   MRN: [id number]  HPI  69 year old well appearing female, patient of Dr. Shawna Orleans, who I am seeing in his absence, who presents to the office today for follow up with hypertension and diabetes. Her last Endocrinology appointment was in March and was supposed to follow up in 1.5 months.She has expressed that cost of her insulin is a factor and has been out of her insulin for 3 weeks. Her most recent A1c is 16.3.   She continues to take her Metformin 1000mg .   Her other concern is a rapid weight loss of 20 pounds over the last 3 months. She continues to have an appetite. Her only form of exercise is that of walking. She denies any cough,fever, nausea, vomiting or diarrhea  Wt Readings from Last 3 Encounters:  09/06/14 141 lb (63.957 kg)  06/05/14 162 lb (73.483 kg)  03/15/14 158 lb (71.668 kg)   Her blood pressure in the office today is 124/60.   Review of Systems  Constitutional: Negative.   HENT: Negative.   Eyes: Negative.   Respiratory: Negative.   Cardiovascular: Negative.   Gastrointestinal: Negative.   Endocrine: Negative.   Genitourinary: Negative.   Musculoskeletal: Negative.   Neurological: Negative.    Past Medical History  Diagnosis Date  . Heart murmur   . Hyperlipidemia   . Type II or unspecified type diabetes mellitus without mention of complication, uncontrolled   . Boils   . HTN (hypertension)   . Hx of adenomatous colonic polyps   . Glaucoma   . Osteoporosis   . Pneumonia     Social History   Social History  . Marital Status: Single    Spouse Name: N/A  . Number of Children: N/A  . Years of Education: N/A   Occupational History  . Not on file.   Social History Main Topics  . Smoking status: Current Some Day Smoker  . Smokeless tobacco: Never Used     Comment: smokes occ.   Marland Kitchen Alcohol Use: Yes  . Drug Use: No  . Sexual Activity: Not on file   Other Topics  Concern  . Not on file   Social History Narrative    Past Surgical History  Procedure Laterality Date  . Colonoscopy    . Polypectomy    . Abdominal hysterectomy    . Tonsillectomy    . Carpal tunnel release      right  . Knee arthroscopy      right  . Orif ankle fracture Right 03/24/2012    Procedure: OPEN REDUCTION INTERNAL FIXATION (ORIF) ANKLE FRACTURE;  Surgeon: Newt Minion, MD;  Location: [facility];  Service: Orthopedics;  Laterality: Right;  Open Reduction Internal Fixation Right Bimalleolar ankle fracture    Family History  Problem Relation Age of Onset  . Colon cancer Neg Hx   . Esophageal cancer Neg Hx   . Rectal cancer Neg Hx   . Stomach cancer Neg Hx   . Diabetes Mother   . Hypertension Mother     No Known Allergies  Current Outpatient Prescriptions on File Prior to Visit  Medication Sig Dispense Refill  . amLODipine (NORVASC) 10 MG tablet Take 1 tablet (10 mg total) by mouth daily. 90 tablet 1  . aspirin EC 81 MG tablet Take 81 mg by mouth daily.    Marland Kitchen atorvastatin (LIPITOR) 20 MG tablet Take 1 tablet (20 mg  total) by mouth daily. 90 tablet 1  . B-D ULTRAFINE III SHORT PEN 31G X 8 MM MISC USE FOUR TIMES DAILY AS DIRECTED 200 each 0  . fluconazole (DIFLUCAN) 150 MG tablet Take 1 tablet (150 mg total) by mouth daily. Repeat dose in 3 days 2 tablet 0  . metFORMIN (GLUCOPHAGE) 1000 MG tablet Take 1 tablet (1,000 mg total) by mouth 2 (two) times daily with a meal. 180 tablet 1  . traMADol (ULTRAM) 50 MG tablet Take 1 tablet (50 mg total) by mouth every 6 (six) hours as needed. 10 tablet 0  . valsartan-hydrochlorothiazide (DIOVAN-HCT) 320-25 MG per tablet Take 1 tablet by mouth daily. 90 tablet 1  . Vitamin D, Ergocalciferol, (DRISDOL) 50000 UNITS CAPS capsule TAKE 1 CAPSULE BY MOUTH TWICE A WEEK ON MONDAY, AND THURSDAY 8 capsule 0  . Insulin Regular Human, Conc, (HUMULIN R U-500 KWIKPEN) 500 UNIT/ML SOPN Inject 40 Units into the skin 3 (three) times daily with meals.  (Patient not taking: Reported on 09/06/2014) 4 pen 2   No current facility-administered medications on file prior to visit.    BP 124/60 mmHg  Pulse 80  Temp(Src) 98.3 F (36.8 C) (Oral)  Resp 20  Ht 5' (1.524 m)  Wt 141 lb (63.957 kg)  BMI 27.54 kg/m2  SpO2 98%       Objective:   Physical Exam  Constitutional: She is oriented to person, place, and time. She appears well-developed and well-nourished. No distress.  Eyes: Conjunctivae are normal. Right eye exhibits no discharge. Left eye exhibits no discharge.  Neck: Normal range of motion. Neck supple.  Cardiovascular: Normal rate, regular rhythm, normal heart sounds and intact distal pulses.  Exam reveals no gallop and no friction rub.   No murmur heard. Pulmonary/Chest: Effort normal and breath sounds normal. No respiratory distress. She has no wheezes. She has no rales. She exhibits no tenderness.  Abdominal: Soft. Bowel sounds are normal. She exhibits no distension and no mass. There is no tenderness. There is no rebound and no guarding.  Musculoskeletal: Normal range of motion.  Lymphadenopathy:    She has no cervical adenopathy.  Neurological: She is alert and oriented to person, place, and time. No cranial nerve deficit. Coordination normal.  Skin: Skin is warm and dry. No rash noted. No erythema. No pallor.  Psychiatric: She has a normal mood and affect. Her behavior is normal. Judgment and thought content normal.  Nursing note and vitals reviewed.      Assessment & Plan:  1. Essential hypertension - Now controlled- continue with current medication therapy  2. Diabetes mellitus type 2, uncontrolled - Advised to call insurance company and find out what insulins are covered.  - Send Dr. Cruzita Lederer a message this afternoon and explain the situation - Make follow up appointment with Dr. Cruzita Lederer.   3. Loss of weight - Likely from DM. Less likely infection or malignancy  - She does not want chest xray at this time.  -

## 2014-09-06 NOTE — Progress Notes (Signed)
Pre visit review using our clinic review tool, if applicable. No additional management support is needed unless otherwise documented below in the visit note. 

## 2014-10-13 ENCOUNTER — Other Ambulatory Visit: Payer: Self-pay | Admitting: Internal Medicine

## 2014-10-14 ENCOUNTER — Ambulatory Visit (INDEPENDENT_AMBULATORY_CARE_PROVIDER_SITE_OTHER): Payer: Medicare Other | Admitting: Internal Medicine

## 2014-10-14 ENCOUNTER — Encounter: Payer: Self-pay | Admitting: Internal Medicine

## 2014-10-14 VITALS — BP 118/62 | HR 87 | Temp 98.2°F | Resp 15 | Ht 60.0 in | Wt 146.0 lb

## 2014-10-14 DIAGNOSIS — E1165 Type 2 diabetes mellitus with hyperglycemia: Secondary | ICD-10-CM

## 2014-10-14 DIAGNOSIS — Z794 Long term (current) use of insulin: Secondary | ICD-10-CM

## 2014-10-14 DIAGNOSIS — IMO0002 Reserved for concepts with insufficient information to code with codable children: Secondary | ICD-10-CM

## 2014-10-14 MED ORDER — INSULIN REGULAR HUMAN (CONC) 500 UNIT/ML ~~LOC~~ SOPN: 70.0000 [IU] | PEN_INJECTOR | Freq: Three times a day (TID) | SUBCUTANEOUS | Status: DC

## 2014-10-14 NOTE — Progress Notes (Signed)
Patient ID: Sarah [name], female   DOB: 02-Aug-1945, 69 y.o.   MRN: [id number]  HPI: Sarah [name] is a 69 y.o.-year-old female, returning for f/u for DM2, dx 1990s insulin-dependent since 2011, uncontrolled, without complications. Last visit 4.5 mo ago.  She was under a lot of stress lately - father died in 10-09-22  Last hemoglobin A1c was: Lab Results  Component Value Date   HGBA1C 16.3* 08/30/2014   HGBA1C 13.8* 02/12/2014   HGBA1C 13.2 Repeated and verified X2.* 11/02/2013   Pt was on a regimen of: - Metformin 1000 mg po bid - Toujeo 40 >> 60 >> 70 units qhs  - Novolog - may miss lunch 32 units for a small meal 36 units for a medium meal 40 units for a large meal She ends up with: B: 32, L: 32, D: 38  She is now on: - Metformin 1000 mg 2x a day  - U500 insulin: 40 units before each meal - was not taking it consistently before last HbA1c Now restarted 70 units 3x a day.    Pt checks her sugars 3x a day and they are (no log >> does not write them down, no meter): - am: 240-300 >> 180-211 >> 150-200, 217 >> 109-185 - 2h after b'fast: n/c - before lunch: n/c >> 200s - 2h after lunch: n/c - before dinner: 200s >> 200-300 >> 240-300 >> 200-250 - 2h after dinner: 200s >> 210 >> 200-300 >> n/c - bedtime: n/c - nighttime: n/c No lows. Lowest sugar was 150 >> 109; she has hypoglycemia awareness at 90.  Highest sugar was 300s >> 200s.  Pt's meals are: - Breakfast: coffee, bagel + cream cheese - Lunch: sandwich, leftovers - Dinner: meat, veggie, potato - Snacks: 3-4 Drinks: some sodas, water + crystal light  - no CKD, last BUN/creatinine:  Lab Results  Component Value Date   BUN 13 08/30/2014   CREATININE 0.63 08/30/2014  On Valsartan. - last set of lipids: Lab Results  Component Value Date   CHOL 179 08/30/2014   HDL 34.50* 08/30/2014   LDLCALC 106* 08/30/2014   TRIG 191.0* 08/30/2014   CHOLHDL 5 08/30/2014  On Lipitor. - last eye exam was in 08-Oct-2013.  No DR.  - no numbness and tingling in her feet. She sees podiatry.  ROS: Constitutional: + weight loss, no fatigue, no subjective hyperthermia/hypothermia Eyes: no blurry vision, no xerophthalmia ENT: no sore throat, no nodules palpated in throat, no dysphagia/odynophagia, no hoarseness Cardiovascular: no CP/SOB/palpitations/no leg swelling Respiratory: no cough/no SOB Gastrointestinal: no N/V/D/C Musculoskeletal: no muscle/joint aches Skin: no rashes Neurological: no tremors/numbness/tingling/dizziness  I reviewed pt's medications, allergies, PMH, social hx, family hx, and changes were documented in the history of present illness. Otherwise, unchanged from my initial visit note:  Past Medical History  Diagnosis Date  . Heart murmur   . Hyperlipidemia   . Type II or unspecified type diabetes mellitus without mention of complication, uncontrolled   . Boils   . HTN (hypertension)   . Hx of adenomatous colonic polyps   . Glaucoma   . Osteoporosis   . Pneumonia    Past Surgical History  Procedure Laterality Date  . Colonoscopy    . Polypectomy    . Abdominal hysterectomy    . Tonsillectomy    . Carpal tunnel release      right  . Knee arthroscopy      right  . Orif ankle fracture Right 03/24/2012    Procedure: OPEN REDUCTION INTERNAL  FIXATION (ORIF) ANKLE FRACTURE;  Surgeon: Newt Minion, MD;  Location: [facility];  Service: Orthopedics;  Laterality: Right;  Open Reduction Internal Fixation Right Bimalleolar ankle fracture   History   Social History  . Marital Status: Single    Spouse Name: N/A    Number of Children: 0   Occupational History  . retired   Social History Main Topics  . Smoking status: Current Some Day Smoker: 2 cigs a day  . Smokeless tobacco: Never Used     Comment: smokes occ.   Marland Kitchen Alcohol Use: Yes  . Drug Use: No   Current Outpatient Prescriptions on File Prior to Visit  Medication Sig Dispense Refill  . amLODipine (NORVASC) 5 MG tablet Take 10 mg  by mouth daily.    Marland Kitchen aspirin EC 81 MG tablet Take 81 mg by mouth daily.    Marland Kitchen atorvastatin (LIPITOR) 20 MG tablet Take 1 tablet (20 mg total) by mouth daily. 90 tablet 1  . metFORMIN (GLUCOPHAGE) 1000 MG tablet Take 1 tablet (1,000 mg total) by mouth 2 (two) times daily with a meal. 180 tablet 1  . miconazole (MICOTIN) 200 MG vaginal suppository Place 1 suppository (200 mg total) vaginally at bedtime. 3 suppository 0  . terbinafine (LAMISIL) 250 MG tablet Take 250 mg by mouth daily.    . valsartan-hydrochlorothiazide (DIOVAN-HCT) 320-25 MG per tablet Take 1 tablet by mouth daily. 30 tablet 5  . Vitamin D, Ergocalciferol, (DRISDOL) 50000 UNITS CAPS capsule TAKE 1 CAPSULE BY MOUTH TWICE DAILY ON MONDAY AND THURSDAY 8 capsule 0  Also, see HPI for diabetic meds.  No Known Allergies   Family History  Problem Relation Age of Onset  . Colon cancer Neg Hx   . Esophageal cancer Neg Hx   . Rectal cancer Neg Hx   . Stomach cancer Neg Hx   . Diabetes Mother   . Hypertension Mother    PE: BP 118/62 mmHg  Pulse 87  Temp(Src) 98.2 F (36.8 C) (Oral)  Resp 15  Ht 5' (1.524 m)  Wt 146 lb (66.225 kg)  BMI 28.51 kg/m2  SpO2 93% Body mass index is 28.51 kg/(m^2).  Wt Readings from Last 3 Encounters:  10/14/14 146 lb (66.225 kg)  09/06/14 141 lb (63.957 kg)  06/05/14 162 lb (73.483 kg)   Constitutional: overweight, in NAD Eyes: PERRLA, EOMI, no exophthalmos ENT: moist mucous membranes, no thyromegaly, no cervical lymphadenopathy Cardiovascular: RRR, + 1/6 SEM, no RG Respiratory: CTA B Gastrointestinal: abdomen soft, NT, ND, BS+ Musculoskeletal: no deformities, strength intact in all 4 Skin: moist, warm, no rashes Neurological: no tremor with outstretched hands, DTR normal in all 4  ASSESSMENT: 1. DM2, insulin-dependent, uncontrolled, without complications - increased insulin resistance  PLAN:  1. Patient with long-standing, uncontrolled diabetes, previously on oral antidiabetic regimen  + basal bolus insulin regimen, now on U500 insulin, with previous noncompliance. Last HbA1c has increased... She did not call me with her sugars or come for an appt as advised. At this visit, sugars better in am but increase throughout the day >> will increase U500 insulin with B and L.Marland Kitchen Also advised her to check sugars at bedtime. -  I suggested to:  Patient Instructions  Please continue Metformin 1000 mg 2x a day. Please increase the U500 insulin doses as follows: - breakfast: 80 units (can increase to 85 if needed) - lunch: 80 units (can increase to 85 if needed) - dinner: 70 units Please check some sugars at bedtime.  Please  schedule a new eye exam.  Please return in 1.5 month with your sugar log (after 11/30/2014).  - continue checking sugars at different times of the day - check 3 times a day, rotating checks - given logs - advised for yearly eye exams - she is up-to-date, but needs a new one soon - Return to clinic in 1.5 mo with sugar log

## 2014-10-14 NOTE — Patient Instructions (Addendum)
Please continue Metformin 1000 mg 2x a day. Please increase the U500 insulin doses as follows: - breakfast: 80 units (can increase to 85 if needed) - lunch: 80 units (can increase to 85 if needed) - dinner: 70 units Please check some sugars at bedtime.  Please schedule a new eye exam.  Please return in 1.5 month with your sugar log (after 11/30/2014).

## 2014-11-07 DIAGNOSIS — E1151 Type 2 diabetes mellitus with diabetic peripheral angiopathy without gangrene: Secondary | ICD-10-CM | POA: Diagnosis not present

## 2014-11-07 DIAGNOSIS — L84 Corns and callosities: Secondary | ICD-10-CM | POA: Diagnosis not present

## 2014-11-07 DIAGNOSIS — L603 Nail dystrophy: Secondary | ICD-10-CM | POA: Diagnosis not present

## 2014-11-07 DIAGNOSIS — I739 Peripheral vascular disease, unspecified: Secondary | ICD-10-CM | POA: Diagnosis not present

## 2014-11-12 DIAGNOSIS — E113293 Type 2 diabetes mellitus with mild nonproliferative diabetic retinopathy without macular edema, bilateral: Secondary | ICD-10-CM | POA: Diagnosis not present

## 2014-11-12 DIAGNOSIS — H25813 Combined forms of age-related cataract, bilateral: Secondary | ICD-10-CM | POA: Diagnosis not present

## 2014-11-12 LAB — HM DIABETES EYE EXAM

## 2014-11-28 ENCOUNTER — Ambulatory Visit (INDEPENDENT_AMBULATORY_CARE_PROVIDER_SITE_OTHER): Payer: Medicare Other

## 2014-11-28 DIAGNOSIS — Z23 Encounter for immunization: Secondary | ICD-10-CM

## 2014-12-04 ENCOUNTER — Encounter: Payer: Self-pay | Admitting: Internal Medicine

## 2014-12-06 ENCOUNTER — Other Ambulatory Visit: Payer: Self-pay | Admitting: Internal Medicine

## 2014-12-10 ENCOUNTER — Other Ambulatory Visit: Payer: Self-pay | Admitting: Internal Medicine

## 2014-12-10 DIAGNOSIS — E1165 Type 2 diabetes mellitus with hyperglycemia: Secondary | ICD-10-CM

## 2014-12-10 DIAGNOSIS — Z794 Long term (current) use of insulin: Principal | ICD-10-CM

## 2014-12-10 DIAGNOSIS — IMO0002 Reserved for concepts with insufficient information to code with codable children: Secondary | ICD-10-CM

## 2014-12-10 MED ORDER — INSULIN PEN NEEDLE 32G X 4 MM MISC: Status: DC

## 2014-12-12 ENCOUNTER — Ambulatory Visit: Payer: Medicare Other | Admitting: Internal Medicine

## 2014-12-14 ENCOUNTER — Emergency Department (INDEPENDENT_AMBULATORY_CARE_PROVIDER_SITE_OTHER): Payer: Medicare Other

## 2014-12-14 ENCOUNTER — Emergency Department (HOSPITAL_COMMUNITY)
Admission: EM | Admit: 2014-12-14 | Discharge: 2014-12-14 | Disposition: A | Discharge status: Home / Self Care | Payer: Medicare Other | Source: Home / Self Care | Attending: Family Medicine | Admitting: Family Medicine

## 2014-12-14 ENCOUNTER — Encounter (HOSPITAL_COMMUNITY): Payer: Self-pay | Admitting: Emergency Medicine

## 2014-12-14 DIAGNOSIS — S99911A Unspecified injury of right ankle, initial encounter: Secondary | ICD-10-CM | POA: Diagnosis not present

## 2014-12-14 DIAGNOSIS — M79671 Pain in right foot: Secondary | ICD-10-CM | POA: Diagnosis not present

## 2014-12-14 DIAGNOSIS — M25571 Pain in right ankle and joints of right foot: Secondary | ICD-10-CM | POA: Diagnosis not present

## 2014-12-14 MED ORDER — NAPROXEN 500 MG PO TABS: 500.0000 mg | ORAL_TABLET | Freq: Two times a day (BID) | ORAL | Status: DC

## 2014-12-14 NOTE — Discharge Instructions (Signed)
It was a pleasure to see you today.   The x-ray performed in the urgent care center does not show a new fracture. Given your prior surgery to that ankle, you are being given a sleeve and crutches to avoid weight bearing.   Naproxen 500mg  tablets, take 1 tablet by mouth twice daily with food, for pain and inflammation. Elevation and placement of ice to the ankle for the next 24 hours.   Follow up with Dr Sharol Given at the beginning of the week.

## 2014-12-14 NOTE — ED Notes (Signed)
The patient presented to the [facility] with a complaint of a right ankle injury secondary to a fall that occurred last night around 10 pm.

## 2014-12-14 NOTE — ED Provider Notes (Addendum)
CSN: [id number]     Arrival date & time 12/14/14  1833 History   First MD Initiated Contact with Patient 12/14/14 1926     Chief Complaint  Patient presents with  . Ankle Pain  . Foot Injury   (Consider location/radiation/quality/duration/timing/severity/associated sxs/prior Treatment) Patient is a 69 y.o. female presenting with ankle pain and foot injury. The history is provided by the patient.  Ankle Pain Associated symptoms: no fever   Foot Injury Associated symptoms: no fever   Patient presents with R ankle pain, which began after slipped and fell in garage last night at [employer] to floor, did not hit head, no LOC.  Was helped up from floor, has been unable to stand on R foot since the fall.  No other injuries sustained during this fall.  Took tramadol tablet since the fall without relief of pain.   Patient has h/o R ankle surgery with hardware placed in 2014 by Dr. Sharol Given.    Past Medical History  Diagnosis Date  . Heart murmur   . Hyperlipidemia   . Type II or unspecified type diabetes mellitus without mention of complication, uncontrolled   . Boils   . HTN (hypertension)   . Hx of adenomatous colonic polyps   . Glaucoma   . Osteoporosis   . Pneumonia    Past Surgical History  Procedure Laterality Date  . Colonoscopy    . Polypectomy    . Abdominal hysterectomy    . Tonsillectomy    . Carpal tunnel release      right  . Knee arthroscopy      right  . Orif ankle fracture Right 03/24/2012    Procedure: OPEN REDUCTION INTERNAL FIXATION (ORIF) ANKLE FRACTURE;  Surgeon: Newt Minion, MD;  Location: [facility];  Service: Orthopedics;  Laterality: Right;  Open Reduction Internal Fixation Right Bimalleolar ankle fracture   Family History  Problem Relation Age of Onset  . Colon cancer Neg Hx   . Esophageal cancer Neg Hx   . Rectal cancer Neg Hx   . Stomach cancer Neg Hx   . Diabetes Mother   . Hypertension Mother    Social History  Substance Use Topics  . Smoking  status: Current Some Day Smoker  . Smokeless tobacco: Never Used     Comment: smokes occ.   Marland Kitchen Alcohol Use: Yes   OB History    No data available     Review of Systems  Constitutional: Negative for fever, chills and appetite change.    Allergies  Review of patient's allergies indicates no known allergies.  Home Medications   Prior to Admission medications   Medication Sig Start Date End Date Taking? Authorizing Provider  amLODipine (NORVASC) 10 MG tablet Take 1 tablet (10 mg total) by mouth daily. 03/15/14   Doe-Hyun R Shawna Orleans, DO  aspirin EC 81 MG tablet Take 81 mg by mouth daily.    Historical Provider, MD  atorvastatin (LIPITOR) 20 MG tablet Take 1 tablet (20 mg total) by mouth daily. 03/15/14   Doe-Hyun R Shawna Orleans, DO  fluconazole (DIFLUCAN) 150 MG tablet Take 1 tablet (150 mg total) by mouth daily. Repeat dose in 3 days 07/11/14   Waldemar Dickens, MD  Insulin Pen Needle 32G X 4 MM MISC Use up to 4x a day 12/10/14   Philemon Kingdom, MD  insulin regular human CONCENTRATED (HUMULIN R U-500 KWIKPEN) 500 UNIT/ML injection Inject 0.14-0.16 mLs (70-80 Units total) into the skin 3 (three) times daily with meals.  10/14/14   Philemon Kingdom, MD  metFORMIN (GLUCOPHAGE) 1000 MG tablet Take 1 tablet (1,000 mg total) by mouth 2 (two) times daily with a meal. 06/05/14   Philemon Kingdom, MD  traMADol (ULTRAM) 50 MG tablet Take 1 tablet (50 mg total) by mouth every 6 (six) hours as needed. 07/11/14   Waldemar Dickens, MD  valsartan-hydrochlorothiazide (DIOVAN-HCT) 320-25 MG per tablet Take 1 tablet by mouth daily. 03/15/14   Doe-Hyun R Shawna Orleans, DO  Vitamin D, Ergocalciferol, (DRISDOL) 50000 UNITS CAPS capsule TAKE 1 CAPSULE BY MOUTH TWICE A WEEK ON MONDAY AND THURSDAY 10/14/14   Doe-Hyun Kyra Searles, DO   Meds Ordered and Administered this Visit  Medications - No data to display  BP 143/61 mmHg  Pulse 90  Temp(Src) 98.3 F (36.8 C) (Oral)  SpO2 97% No data found.   Physical Exam  Constitutional: She appears  well-developed and well-nourished.  Sitting in wheelchair, no distress.   Musculoskeletal:  Notable edema and soft tissue swelling in right ankle, most notable around lateral malleolus. Palpable dp pulses bilaterally. Brisk cap refill <2 seconds in all toes of right foot. No ecchymosis noted in R foot. Limited dorsiflexion and plantarflexion. Tenderness to squeeze malleoli on R ankle. Sensation grossly intact in R toes.     ED Course  Procedures (including critical care time)  Labs Review Labs Reviewed - No data to display  Imaging Review Dg Ankle Complete Right  12/14/2014  CLINICAL DATA:  Lateral right foot and ankle pain following a fall 1 day ago. EXAM: RIGHT ANKLE - COMPLETE 3+ VIEW COMPARISON:  03/21/2012. FINDINGS: Interval screw and plate fixation of the distal fibula and screw fixation of the medial malleolus. Talotibial spur formation and corticated bone fragments. There is also spur formation at the articulation of the lateral malleolus and talus. There is also interval deformity of the medial aspect of the talar dome with some linear lucencies in that region. This may be at least partly due to overlying bone fragments. Otherwise, no acute fracture or dislocation seen. Mild to moderate inferior and posterior calcaneal spur formation. IMPRESSION: 1. Postoperative and degenerative changes, as described above. 2. Interval small area of fracture in the medial aspect of the talar dome versus overlapping bone fragments. Electronically Signed   By: Claudie Revering M.D.   On: 12/14/2014 19:46   Dg Foot Complete Right  12/14/2014  CLINICAL DATA:  Lateral foot and ankle pain status post fall. EXAM: RIGHT FOOT COMPLETE - 3+ VIEW COMPARISON:  03/21/2012 FINDINGS: No evidence of acute fracture or subluxation. No focal bone lesion or bone destruction. Bilateral malleolar fracture fixation is noted, without evidence of hardware failure. There is soft tissue swelling about the ankle. IMPRESSION: No evidence  of acute fracture or subluxation. Electronically Signed   By: Fidela Salisbury M.D.   On: 12/14/2014 19:46     Visual Acuity Review  Right Eye Distance:   Left Eye Distance:   Bilateral Distance:    Right Eye Near:   Left Eye Near:    Bilateral Near:         MDM  No diagnosis found. Patient with prior injury and surgical intervention in R ankle, now s/p fall yesterday with injury to the same ankle.  XR report without apparent acute fracture. Given history and hardware, to immobilize and avoid weight bearing. Referred to her orthopedist Dr Sharol Given for further evaluation and clearance for weight bearing.     Willeen Niece, MD 12/14/14 [place]  Lindell Noe, MD 12/14/14 2003

## 2015-01-02 DIAGNOSIS — M25571 Pain in right ankle and joints of right foot: Secondary | ICD-10-CM | POA: Diagnosis not present

## 2015-01-02 DIAGNOSIS — G8929 Other chronic pain: Secondary | ICD-10-CM | POA: Diagnosis not present

## 2015-01-02 DIAGNOSIS — M79671 Pain in right foot: Secondary | ICD-10-CM | POA: Diagnosis not present

## 2015-01-07 DIAGNOSIS — M25571 Pain in right ankle and joints of right foot: Secondary | ICD-10-CM | POA: Diagnosis not present

## 2015-01-08 ENCOUNTER — Other Ambulatory Visit: Payer: Self-pay | Admitting: Internal Medicine

## 2015-01-22 DIAGNOSIS — S99911D Unspecified injury of right ankle, subsequent encounter: Secondary | ICD-10-CM | POA: Diagnosis not present

## 2015-01-22 DIAGNOSIS — M6281 Muscle weakness (generalized): Secondary | ICD-10-CM | POA: Diagnosis not present

## 2015-01-22 DIAGNOSIS — M25571 Pain in right ankle and joints of right foot: Secondary | ICD-10-CM | POA: Diagnosis not present

## 2015-02-04 ENCOUNTER — Encounter: Payer: Self-pay | Admitting: Internal Medicine

## 2015-02-04 ENCOUNTER — Other Ambulatory Visit (INDEPENDENT_AMBULATORY_CARE_PROVIDER_SITE_OTHER): Payer: Medicare Other | Admitting: *Deleted

## 2015-02-04 ENCOUNTER — Ambulatory Visit (INDEPENDENT_AMBULATORY_CARE_PROVIDER_SITE_OTHER): Payer: Medicare Other | Admitting: Internal Medicine

## 2015-02-04 VITALS — BP 108/62 | HR 100 | Temp 98.2°F | Resp 12 | Wt 156.0 lb

## 2015-02-04 DIAGNOSIS — IMO0002 Reserved for concepts with insufficient information to code with codable children: Secondary | ICD-10-CM

## 2015-02-04 DIAGNOSIS — Z794 Long term (current) use of insulin: Secondary | ICD-10-CM | POA: Diagnosis not present

## 2015-02-04 DIAGNOSIS — E1165 Type 2 diabetes mellitus with hyperglycemia: Secondary | ICD-10-CM | POA: Diagnosis not present

## 2015-02-04 DIAGNOSIS — M6281 Muscle weakness (generalized): Secondary | ICD-10-CM | POA: Diagnosis not present

## 2015-02-04 DIAGNOSIS — M25571 Pain in right ankle and joints of right foot: Secondary | ICD-10-CM | POA: Diagnosis not present

## 2015-02-04 DIAGNOSIS — S99911D Unspecified injury of right ankle, subsequent encounter: Secondary | ICD-10-CM | POA: Diagnosis not present

## 2015-02-04 LAB — POCT GLYCOSYLATED HEMOGLOBIN (HGB A1C): Hemoglobin A1C: 11.8

## 2015-02-04 MED ORDER — INSULIN REGULAR HUMAN (CONC) 500 UNIT/ML ~~LOC~~ SOPN: 70.0000 [IU] | PEN_INJECTOR | Freq: Three times a day (TID) | SUBCUTANEOUS | Status: DC

## 2015-02-04 NOTE — Progress Notes (Signed)
Patient ID: Sarah [name], female   DOB: 1945-09-17, 70 y.o.   MRN: [id number]  HPI: Sarah [name] is a 70 y.o.-year-old female, returning for f/u for DM2, dx 1990s insulin-dependent since 2011, uncontrolled, without complications. Last visit 3 mo ago.  Her father and father in law died this winter.  Last hemoglobin A1c was: Lab Results  Component Value Date   HGBA1C 16.3* 08/30/2014   HGBA1C 13.8* 02/12/2014   HGBA1C 13.2 Repeated and verified X2.* 11/02/2013   Pt was on a regimen of: - Metformin 1000 mg po bid - Toujeo 40 >> 60 >> 70 units qhs  - Novolog - may miss lunch 32 units for a small meal 36 units for a medium meal 40 units for a large meal She ends up with: B: 32, L: 32, D: 38  She is now on:  Metformin 1000 mg 2x a day.  U500 insulin - was not taking it consistently before last HbA1c - breakfast: 70 units  - lunch: 80 units - dinner: 80 units    Pt checks her sugars 3x a day and they are (no log >> does not write them down, no meter): - am: 240-300 >> 180-211 >> 150-200, 217 >> 109-185 >> 150-160 - 2h after b'fast: n/c - before lunch: n/c >> 200s >> n/c - 2h after lunch: n/c >> 200-220 - before dinner: 200s >> 200-300 >> 240-300 >> 200-250 >> 200-240 - 2h after dinner: 200s >> 210 >> 200-300 >> n/c  - bedtime: n/c >> 190-200 - nighttime: n/c No lows. Lowest sugar was 150 >> 109 >> 90s x1(at night); she has hypoglycemia awareness at 90.  Highest sugar was 300s >> 200s >> 305.  Pt's meals are: - Breakfast: coffee, bagel + cream cheese - Lunch: sandwich, leftovers - Dinner: meat, veggie, potato - Snacks: 3-4 Drinks: some sodas, water + crystal light  - no CKD, last BUN/creatinine:  Lab Results  Component Value Date   BUN 13 08/30/2014   CREATININE 0.63 08/30/2014  On Valsartan. - last set of lipids: Lab Results  Component Value Date   CHOL 179 08/30/2014   HDL 34.50* 08/30/2014   LDLCALC 106* 08/30/2014   TRIG 191.0* 08/30/2014    CHOLHDL 5 08/30/2014  On Lipitor. - last eye exam was in 11/12/2014. No DR.  - no numbness and tingling in her feet. She sees podiatry.  ROS: Constitutional: + weight loss, no fatigue, no subjective hyperthermia/hypothermia Eyes: no blurry vision, no xerophthalmia ENT: no sore throat, no nodules palpated in throat, no dysphagia/odynophagia, no hoarseness Cardiovascular: no CP/SOB/palpitations/no leg swelling Respiratory: no cough/no SOB Gastrointestinal: no N/V/D/C Musculoskeletal: no muscle/joint aches Skin: no rashes Neurological: no tremors/numbness/tingling/dizziness  I reviewed pt's medications, allergies, PMH, social hx, family hx, and changes were documented in the history of present illness. Otherwise, unchanged from my initial visit note:  Past Medical History  Diagnosis Date  . Heart murmur   . Hyperlipidemia   . Type II or unspecified type diabetes mellitus without mention of complication, uncontrolled   . Boils   . HTN (hypertension)   . Hx of adenomatous colonic polyps   . Glaucoma   . Osteoporosis   . Pneumonia    Past Surgical History  Procedure Laterality Date  . Colonoscopy    . Polypectomy    . Abdominal hysterectomy    . Tonsillectomy    . Carpal tunnel release      right  . Knee arthroscopy  right  . Orif ankle fracture Right 03/24/2012    Procedure: OPEN REDUCTION INTERNAL FIXATION (ORIF) ANKLE FRACTURE;  Surgeon: Newt Minion, MD;  Location: [facility];  Service: Orthopedics;  Laterality: Right;  Open Reduction Internal Fixation Right Bimalleolar ankle fracture   History   Social History  . Marital Status: Single    Spouse Name: N/A    Number of Children: 0   Occupational History  . retired   Social History Main Topics  . Smoking status: Current Some Day Smoker: 2 cigs a day  . Smokeless tobacco: Never Used     Comment: smokes occ.   Marland Kitchen Alcohol Use: Yes  . Drug Use: No   Current Outpatient Prescriptions on File Prior to Visit   Medication Sig Dispense Refill  . amLODipine (NORVASC) 5 MG tablet Take 10 mg by mouth daily.    Marland Kitchen aspirin EC 81 MG tablet Take 81 mg by mouth daily.    Marland Kitchen atorvastatin (LIPITOR) 20 MG tablet Take 1 tablet (20 mg total) by mouth daily. 90 tablet 1  . metFORMIN (GLUCOPHAGE) 1000 MG tablet Take 1 tablet (1,000 mg total) by mouth 2 (two) times daily with a meal. 180 tablet 1  . miconazole (MICOTIN) 200 MG vaginal suppository Place 1 suppository (200 mg total) vaginally at bedtime. 3 suppository 0  . terbinafine (LAMISIL) 250 MG tablet Take 250 mg by mouth daily.    . valsartan-hydrochlorothiazide (DIOVAN-HCT) 320-25 MG per tablet Take 1 tablet by mouth daily. 30 tablet 5  . Vitamin D, Ergocalciferol, (DRISDOL) 50000 UNITS CAPS capsule TAKE 1 CAPSULE BY MOUTH TWICE DAILY ON MONDAY AND THURSDAY 8 capsule 0  Also, see HPI for diabetic meds.  No Known Allergies   Family History  Problem Relation Age of Onset  . Colon cancer Neg Hx   . Esophageal cancer Neg Hx   . Rectal cancer Neg Hx   . Stomach cancer Neg Hx   . Diabetes Mother   . Hypertension Mother    PE: BP 108/62 mmHg  Pulse 100  Temp(Src) 98.2 F (36.8 C) (Oral)  Resp 12  Wt 156 lb (70.761 kg)  SpO2 97% Body mass index is 30.47 kg/(m^2).  Wt Readings from Last 3 Encounters:  10/14/14 146 lb (66.225 kg)  09/06/14 141 lb (63.957 kg)  06/05/14 162 lb (73.483 kg)   Constitutional: overweight, in NAD Eyes: PERRLA, EOMI, no exophthalmos ENT: moist mucous membranes, no thyromegaly, no cervical lymphadenopathy Cardiovascular: RRR, + 1/6 SEM, no RG Respiratory: CTA B Gastrointestinal: abdomen soft, NT, ND, BS+ Musculoskeletal: no deformities, strength intact in all 4 Skin: moist, warm, no rashes Neurological: no tremor with outstretched hands, DTR normal in all 4  ASSESSMENT: 1. DM2, insulin-dependent, uncontrolled, without complications - increased insulin resistance  PLAN:  1. Patient with long-standing, uncontrolled  diabetes, previously on oral antidiabetic regimen + U500 insulin. Last HbA1c has increased as she was not completely compliant with her U500 insulin doses. She is doing much better with this, despite multiple stressors at home and recent Holidays. Her sugars are still high after lunch and before dinner >> will increase the insulin with breakfast and lunch. -  I suggested to:  Patient Instructions  Please continue:  Metformin 1000 mg 2x a day.  U500 insulin - breakfast: 70 units >> 75 units  - lunch: 80 units >> 85 units - dinner: 80 units  Please let me know about the sugars in 2-3 weeks.  Please come back for a follow-up appointment  in 3 months.  - continue checking sugars at different times of the day - check 3 times a day, rotating checks - given logs - advised for yearly eye exams - she is up-to-date - checked HbA1c >> 11.8% (improved by 5%!) - Return to clinic in 3 mo with sugar log

## 2015-02-04 NOTE — Patient Instructions (Signed)
Please continue:  Metformin 1000 mg 2x a day.  U500 insulin - breakfast: 70 units >> 75 units  - lunch: 80 units >> 85 units - dinner: 80 units  Please let me know about the sugars in 2-3 weeks.  Please come back for a follow-up appointment in 3 months.

## 2015-02-05 ENCOUNTER — Other Ambulatory Visit: Payer: Self-pay | Admitting: Internal Medicine

## 2015-02-07 DIAGNOSIS — M25571 Pain in right ankle and joints of right foot: Secondary | ICD-10-CM | POA: Diagnosis not present

## 2015-02-07 DIAGNOSIS — M6281 Muscle weakness (generalized): Secondary | ICD-10-CM | POA: Diagnosis not present

## 2015-02-07 DIAGNOSIS — S99911D Unspecified injury of right ankle, subsequent encounter: Secondary | ICD-10-CM | POA: Diagnosis not present

## 2015-02-11 DIAGNOSIS — S99911D Unspecified injury of right ankle, subsequent encounter: Secondary | ICD-10-CM | POA: Diagnosis not present

## 2015-02-11 DIAGNOSIS — I739 Peripheral vascular disease, unspecified: Secondary | ICD-10-CM | POA: Diagnosis not present

## 2015-02-11 DIAGNOSIS — L603 Nail dystrophy: Secondary | ICD-10-CM | POA: Diagnosis not present

## 2015-02-11 DIAGNOSIS — M6281 Muscle weakness (generalized): Secondary | ICD-10-CM | POA: Diagnosis not present

## 2015-02-11 DIAGNOSIS — L84 Corns and callosities: Secondary | ICD-10-CM | POA: Diagnosis not present

## 2015-02-11 DIAGNOSIS — E1051 Type 1 diabetes mellitus with diabetic peripheral angiopathy without gangrene: Secondary | ICD-10-CM | POA: Diagnosis not present

## 2015-02-11 DIAGNOSIS — M24574 Contracture, right foot: Secondary | ICD-10-CM | POA: Diagnosis not present

## 2015-02-11 DIAGNOSIS — M25571 Pain in right ankle and joints of right foot: Secondary | ICD-10-CM | POA: Diagnosis not present

## 2015-02-18 DIAGNOSIS — M25475 Effusion, left foot: Secondary | ICD-10-CM | POA: Diagnosis not present

## 2015-02-18 DIAGNOSIS — M19072 Primary osteoarthritis, left ankle and foot: Secondary | ICD-10-CM | POA: Diagnosis not present

## 2015-02-25 DIAGNOSIS — M6281 Muscle weakness (generalized): Secondary | ICD-10-CM | POA: Diagnosis not present

## 2015-02-25 DIAGNOSIS — S99911D Unspecified injury of right ankle, subsequent encounter: Secondary | ICD-10-CM | POA: Diagnosis not present

## 2015-02-25 DIAGNOSIS — M25571 Pain in right ankle and joints of right foot: Secondary | ICD-10-CM | POA: Diagnosis not present

## 2015-03-06 ENCOUNTER — Other Ambulatory Visit (INDEPENDENT_AMBULATORY_CARE_PROVIDER_SITE_OTHER): Payer: Medicare Other

## 2015-03-06 DIAGNOSIS — Z0000 Encounter for general adult medical examination without abnormal findings: Secondary | ICD-10-CM

## 2015-03-06 LAB — BASIC METABOLIC PANEL
BUN: 16 mg/dL (ref 6–23)
CO2: 28 mEq/L (ref 19–32)
Calcium: 10.2 mg/dL (ref 8.4–10.5)
Chloride: 103 mEq/L (ref 96–112)
Creatinine, Ser: 0.69 mg/dL (ref 0.40–1.20)
GFR: 108.94 mL/min (ref >60.00)
Glucose, Bld: 384 mg/dL — ABNORMAL HIGH (ref 70–99)
Potassium: 5.1 mEq/L (ref 3.5–5.1)
Sodium: 138 mEq/L (ref 135–145)

## 2015-03-06 LAB — CBC WITH DIFFERENTIAL/PLATELET
Basophils Absolute: 0 10*3/uL (ref 0.0–0.1)
Basophils Relative: 0.5 % (ref 0.0–3.0)
Eosinophils Absolute: 0.1 10*3/uL (ref 0.0–0.7)
Eosinophils Relative: 1.3 % (ref 0.0–5.0)
HCT: 38.1 % (ref 36.0–46.0)
Hemoglobin: 12.8 g/dL (ref 12.0–15.0)
Lymphocytes Relative: 38.8 % (ref 12.0–46.0)
Lymphs Abs: 3 10*3/uL (ref 0.7–4.0)
MCHC: 33.7 g/dL (ref 30.0–36.0)
MCV: 92.6 fl (ref 78.0–100.0)
Monocytes Absolute: 0.5 10*3/uL (ref 0.1–1.0)
Monocytes Relative: 6.9 % (ref 3.0–12.0)
Neutro Abs: 4.1 10*3/uL (ref 1.4–7.7)
Neutrophils Relative %: 52.5 % (ref 43.0–77.0)
Platelets: 329 10*3/uL (ref 150.0–400.0)
RBC: 4.11 Mil/uL (ref 3.87–5.11)
RDW: 13.1 % (ref 11.5–15.5)
WBC: 7.8 10*3/uL (ref 4.0–10.5)

## 2015-03-06 LAB — HEPATIC FUNCTION PANEL
ALT: 8 U/L (ref 0–35)
AST: 10 U/L (ref 0–37)
Albumin: 3.9 g/dL (ref 3.5–5.2)
Alkaline Phosphatase: 87 U/L (ref 39–117)
Bilirubin, Direct: 0.1 mg/dL (ref 0.0–0.3)
Total Bilirubin: 0.5 mg/dL (ref 0.2–1.2)
Total Protein: 6.9 g/dL (ref 6.0–8.3)

## 2015-03-06 LAB — POC URINALSYSI DIPSTICK (AUTOMATED)
Bilirubin, UA: NEGATIVE
Blood, UA: NEGATIVE
Ketones, UA: NEGATIVE
Leukocytes, UA: NEGATIVE
Nitrite, UA: NEGATIVE
Spec Grav, UA: 1.02
Urobilinogen, UA: 1
pH, UA: 5.5

## 2015-03-06 LAB — LIPID PANEL
Cholesterol: 127 mg/dL (ref 0–200)
HDL: 42 mg/dL (ref >39.00)
LDL Cholesterol: 61 mg/dL (ref 0–99)
NonHDL: 84.67
Total CHOL/HDL Ratio: 3
Triglycerides: 118 mg/dL (ref 0.0–149.0)
VLDL: 23.6 mg/dL (ref 0.0–40.0)

## 2015-03-06 LAB — HEMOGLOBIN A1C: Hgb A1c MFr Bld: 12.4 % — ABNORMAL HIGH (ref 4.6–6.5)

## 2015-03-06 LAB — TSH: TSH: 1.15 u[IU]/mL (ref 0.35–4.50)

## 2015-03-06 LAB — MICROALBUMIN / CREATININE URINE RATIO
Creatinine,U: 147.2 mg/dL
Microalb Creat Ratio: 17.5 mg/g (ref 0.0–30.0)
Microalb, Ur: 25.8 mg/dL — ABNORMAL HIGH (ref 0.0–1.9)

## 2015-03-10 ENCOUNTER — Other Ambulatory Visit: Payer: Self-pay | Admitting: Internal Medicine

## 2015-03-11 ENCOUNTER — Encounter: Payer: Medicare Other | Admitting: Family Medicine

## 2015-03-14 ENCOUNTER — Ambulatory Visit (INDEPENDENT_AMBULATORY_CARE_PROVIDER_SITE_OTHER): Payer: Medicare Other | Admitting: Family Medicine

## 2015-03-14 ENCOUNTER — Encounter: Payer: Self-pay | Admitting: Family Medicine

## 2015-03-14 VITALS — BP 130/63 | HR 83 | Temp 98.4°F | Ht 60.0 in | Wt 154.0 lb

## 2015-03-14 DIAGNOSIS — Z0000 Encounter for general adult medical examination without abnormal findings: Secondary | ICD-10-CM | POA: Diagnosis not present

## 2015-03-14 NOTE — Progress Notes (Signed)
Pre visit review using our clinic review tool, if applicable. No additional management support is needed unless otherwise documented below in the visit note. 

## 2015-03-14 NOTE — Progress Notes (Signed)
   Subjective:    Patient ID: Sarah [name], female    DOB: 06/21/1945, 70 y.o.   MRN: [id number]  HPI 70 yr old female for a cpx. She feels well in general although she is tired. She is busy caring for her 59 yr old mother, who lives with her. She sees Dr. Cruzita Lederer for her diabetes, and this has been a struggle to control. She tries to watch her diet but her recent A1c was up to 12.4. She gets regular eye exams and foot exams.    Review of Systems  Constitutional: Negative.   HENT: Negative.   Eyes: Negative.   Respiratory: Negative.   Cardiovascular: Negative.   Gastrointestinal: Negative.   Genitourinary: Negative for dysuria, urgency, frequency, hematuria, flank pain, decreased urine volume, enuresis, difficulty urinating, pelvic pain and dyspareunia.  Musculoskeletal: Negative.   Skin: Negative.   Neurological: Negative.   Psychiatric/Behavioral: Negative.        Objective:   Physical Exam  Constitutional: She is oriented to person, place, and time. She appears well-developed and well-nourished. No distress.  HENT:  Head: Normocephalic and atraumatic.  Right Ear: External ear normal.  Left Ear: External ear normal.  Nose: Nose normal.  Mouth/Throat: Oropharynx is clear and moist. No oropharyngeal exudate.  Eyes: Conjunctivae and EOM are normal. Pupils are equal, round, and reactive to light. No scleral icterus.  Neck: Normal range of motion. Neck supple. No JVD present. No thyromegaly present.  Cardiovascular: Normal rate, regular rhythm, normal heart sounds and intact distal pulses.  Exam reveals no gallop and no friction rub.   No murmur heard. Pulmonary/Chest: Effort normal and breath sounds normal. No respiratory distress. She has no wheezes. She has no rales. She exhibits no tenderness.  Abdominal: Soft. Bowel sounds are normal. She exhibits no distension and no mass. There is no tenderness. There is no rebound and no guarding.  Musculoskeletal: Normal range of  motion. She exhibits no edema or tenderness.  Lymphadenopathy:    She has no cervical adenopathy.  Neurological: She is alert and oriented to person, place, and time. She has normal reflexes. No cranial nerve deficit. She exhibits normal muscle tone. Coordination normal.  Skin: Skin is warm and dry. No rash noted. No erythema.  Psychiatric: She has a normal mood and affect. Her behavior is normal. Judgment and thought content normal.          Assessment & Plan:  Well exam. We discussed diet and exercise. She will see Dr. Cruzita Lederer again next month.

## 2015-04-10 DIAGNOSIS — Z1231 Encounter for screening mammogram for malignant neoplasm of breast: Secondary | ICD-10-CM | POA: Diagnosis not present

## 2015-04-10 DIAGNOSIS — Z803 Family history of malignant neoplasm of breast: Secondary | ICD-10-CM | POA: Diagnosis not present

## 2015-04-10 LAB — HM MAMMOGRAPHY

## 2015-04-24 ENCOUNTER — Encounter: Payer: Self-pay | Admitting: Internal Medicine

## 2015-05-06 ENCOUNTER — Ambulatory Visit: Payer: Medicare Other | Admitting: Internal Medicine

## 2015-05-12 ENCOUNTER — Ambulatory Visit: Payer: Medicare Other | Admitting: Internal Medicine

## 2015-05-12 DIAGNOSIS — Z0289 Encounter for other administrative examinations: Secondary | ICD-10-CM

## 2015-07-22 DIAGNOSIS — H25813 Combined forms of age-related cataract, bilateral: Secondary | ICD-10-CM | POA: Diagnosis not present

## 2015-07-22 DIAGNOSIS — E119 Type 2 diabetes mellitus without complications: Secondary | ICD-10-CM | POA: Diagnosis not present

## 2015-07-22 LAB — HM DIABETES EYE EXAM

## 2015-07-23 ENCOUNTER — Encounter: Payer: Self-pay | Admitting: Family Medicine

## 2015-07-23 ENCOUNTER — Ambulatory Visit (INDEPENDENT_AMBULATORY_CARE_PROVIDER_SITE_OTHER): Payer: Medicare Other | Admitting: Family Medicine

## 2015-07-23 VITALS — BP 155/60 | HR 95 | Temp 98.2°F | Resp 12 | Ht 60.0 in | Wt 148.0 lb

## 2015-07-23 DIAGNOSIS — E1165 Type 2 diabetes mellitus with hyperglycemia: Secondary | ICD-10-CM

## 2015-07-23 DIAGNOSIS — I10 Essential (primary) hypertension: Secondary | ICD-10-CM | POA: Diagnosis not present

## 2015-07-23 DIAGNOSIS — Z794 Long term (current) use of insulin: Secondary | ICD-10-CM | POA: Diagnosis not present

## 2015-07-23 DIAGNOSIS — E559 Vitamin D deficiency, unspecified: Secondary | ICD-10-CM | POA: Insufficient documentation

## 2015-07-23 DIAGNOSIS — IMO0002 Reserved for concepts with insufficient information to code with codable children: Secondary | ICD-10-CM

## 2015-07-23 LAB — BASIC METABOLIC PANEL
BUN: 11 mg/dL (ref 6–23)
CO2: 27 mEq/L (ref 19–32)
Calcium: 9.8 mg/dL (ref 8.4–10.5)
Chloride: 98 mEq/L (ref 96–112)
Creatinine, Ser: 0.57 mg/dL (ref 0.40–1.20)
GFR: 135.66 mL/min (ref >60.00)
Glucose, Bld: 361 mg/dL — ABNORMAL HIGH (ref 70–99)
Potassium: 4.2 mEq/L (ref 3.5–5.1)
Sodium: 142 mEq/L (ref 135–145)

## 2015-07-23 LAB — VITAMIN D 25 HYDROXY (VIT D DEFICIENCY, FRACTURES): VITD: 33.96 ng/mL (ref 30.00–100.00)

## 2015-07-23 LAB — HEMOGLOBIN A1C: Hgb A1c MFr Bld: 13.7 % — ABNORMAL HIGH (ref 4.6–6.5)

## 2015-07-23 MED ORDER — VALSARTAN-HYDROCHLOROTHIAZIDE 320-25 MG PO TABS: 1.0000 | ORAL_TABLET | Freq: Every day | ORAL | Status: DC

## 2015-07-23 MED ORDER — AMLODIPINE BESYLATE 10 MG PO TABS: 10.0000 mg | ORAL_TABLET | Freq: Every day | ORAL | Status: DC

## 2015-07-23 MED ORDER — INSULIN REGULAR HUMAN (CONC) 500 UNIT/ML ~~LOC~~ SOPN: 70.0000 [IU] | PEN_INJECTOR | Freq: Three times a day (TID) | SUBCUTANEOUS | Status: DC

## 2015-07-23 NOTE — Progress Notes (Signed)
HPI:   [name] is a 70 y.o. female, who is here today to establish care with me and to follow on some of her chronic medical problems.  Former PCP: Dr Sarah [name] Last preventive routine visit: 03/2015.  Concerns today: Refills for insulin.   Hypertension: Currently she is on Amlodipine 10 mg and Diovan HCT 320-25 mg daily. . She has not taken medications as instructed, none for 3-4 days, no side effects reported.  She has not noted unusual headache, visual changes, exertional chest pain, dyspnea,  focal weakness, or edema.   Lab Results  Component Value Date   CREATININE 0.69 03/06/2015   BUN 16 03/06/2015   NA 138 03/06/2015   K 5.1 03/06/2015   CL 103 03/06/2015   CO2 28 03/06/2015     Diabetes Mellitus II: she is currently on Humulin R U-500 tid (65,70,and 70 U) and Metformin 1000 mg bid.  Problem has been stable. She is checking BS's, denies any hypoglycemia, BS's "high" She is tolerating medications well.States that she has tried other meds in the past but did not tolerate well.  She denies abdominal pain, nausea, vomiting, polydipsia, polyuria, or polyphagia. + Wt loss.  No numbness or tingling, but burning left foot, she follows with podiatrist.  Eye exam yesterday, 6 months f/u recommended, no retinopathy.  She is supposed to follow with endo but has not set up appt.   Since her father died in 08-27-2014, she has not followed a healthy diet. Drinks regular Pepsis daily.  She is not exercising regularly, active around the house.    Lab Results  Component Value Date   CREATININE 0.69 03/06/2015   BUN 16 03/06/2015   NA 138 03/06/2015   K 5.1 03/06/2015   CL 103 03/06/2015   CO2 28 03/06/2015    Lab Results  Component Value Date   HGBA1C 12.4* 03/06/2015   Lab Results  Component Value Date   MICROALBUR 25.8* 03/06/2015     Hyperlipidemia: She is on Atorvastatin 20 mg daily. Not following a low fat diet. She has not noted  side effects with medication.  Lab Results  Component Value Date   CHOL 127 03/06/2015   HDL 42.00 03/06/2015   LDLCALC 61 03/06/2015   TRIG 118.0 03/06/2015   CHOLHDL 3 03/06/2015    Vit D deficiency: She is on Ergocalciferol 50,000 2 times per week.     Review of Systems  Constitutional: Positive for fatigue and unexpected weight change. Negative for fever, diaphoresis and appetite change.  HENT: Negative for mouth sores, nosebleeds and trouble swallowing.   Eyes: Negative for redness and visual disturbance.  Respiratory: Negative for cough, shortness of breath and wheezing.   Cardiovascular: Negative for chest pain, palpitations and leg swelling.  Gastrointestinal: Negative for nausea, vomiting and abdominal pain.       Negative for changes in bowel habits.  Endocrine: Negative for polydipsia, polyphagia and polyuria.  Genitourinary: Negative for dysuria, hematuria, decreased urine volume and difficulty urinating.  Musculoskeletal: Positive for arthralgias. Negative for myalgias.  Skin: Negative for rash and wound.  Neurological: Negative for seizures, syncope, weakness, numbness and headaches.  Psychiatric/Behavioral: Negative for confusion. The patient is not nervous/anxious.       Current Outpatient Prescriptions on File Prior to Visit  Medication Sig Dispense Refill  . aspirin EC 81 MG tablet Take 81 mg by mouth daily.    Marland Kitchen atorvastatin (LIPITOR) 20 MG tablet Take 1 tablet (20 mg total)  by mouth daily. 90 tablet 1  . Insulin Pen Needle 32G X 4 MM MISC Use up to 4x a day 200 each 11  . metFORMIN (GLUCOPHAGE) 1000 MG tablet Take 1 tablet (1,000 mg total) by mouth 2 (two) times daily with a meal. (Patient taking differently: Take 1,000 mg by mouth 2 (two) times daily with a meal. Only takes once a day) 180 tablet 1  . Vitamin D, Ergocalciferol, (DRISDOL) 50000 units CAPS capsule TAKE 1 CAPSULE BY MOUTH TWICE A WEEK ON MONDAY AND THURSDAY 8 capsule 0   No current  facility-administered medications on file prior to visit.     Past Medical History  Diagnosis Date  . Heart murmur   . Hyperlipidemia   . Type II or unspecified type diabetes mellitus without mention of complication, uncontrolled   . Boils   . HTN (hypertension)   . Hx of adenomatous colonic polyps   . Glaucoma   . Osteoporosis   . Pneumonia    No Known Allergies  Family History  Problem Relation Age of Onset  . Colon cancer Neg Hx   . Esophageal cancer Neg Hx   . Rectal cancer Neg Hx   . Stomach cancer Neg Hx   . Diabetes Mother   . Hypertension Mother     Social History   Social History  . Marital Status: Single    Spouse Name: N/A  . Number of Children: N/A  . Years of Education: N/A   Social History Main Topics  . Smoking status: Current Some Day Smoker  . Smokeless tobacco: Never Used     Comment: smokes occ.   Marland Kitchen Alcohol Use: 0.0 oz/week    0 Standard drinks or equivalent per week     Comment: occ  . Drug Use: No  . Sexual Activity: Not Asked   Other Topics Concern  . None   Social History Narrative    Filed Vitals:   07/23/15 0919  BP: 155/60  Pulse: 95  Temp: 98.2 F (36.8 C)  Resp: 12    Body mass index is 28.9 kg/(m^2).   SpO2 Readings from Last 3 Encounters:  07/23/15 97%  02/04/15 97%  12/14/14 97%      Physical Exam  Nursing note and vitals reviewed. Constitutional: She is oriented to person, place, and time. She appears well-developed. No distress.  HENT:  Head: Atraumatic.  Mouth/Throat: Oropharynx is clear and moist and mucous membranes are normal.  Eyes: Conjunctivae and EOM are normal. Pupils are equal, round, and reactive to light.  Neck: No tracheal deviation present. Thyromegaly (mild) present. No thyroid mass present.  Cardiovascular: Normal rate and regular rhythm.   Murmur (SEM I/VI RUSB) heard. DP pulses present bilateral.  Respiratory: Effort normal and breath sounds normal. No respiratory distress.  GI:  Soft. She exhibits no mass. There is no hepatomegaly. There is no tenderness.  Musculoskeletal: She exhibits no edema.       Feet:  + callus.  Lymphadenopathy:    She has no cervical adenopathy.  Neurological: She is alert and oriented to person, place, and time. She has normal strength. Coordination and gait normal.  Skin: Skin is warm. No erythema.  Psychiatric: She has a normal mood and affect.  good eye contact.     Diabetic foot exam:  Monofilament normal bilateral. Peripheral pulses present (DP). + calluses, left foot No hypertrophic/long toenails.  ASSESSMENT AND PLAN:     Sarah [name] was seen today for transfer from [facility].  Diagnoses and all orders for this visit:  Uncontrolled type 2 diabetes mellitus with insulin therapy ([place])  HgA1C has not been at goal. Educated about complications of DM. No changes in current management for now, will consider Trulicity or another GLP1 and/or  basal insulin depending of HgA1C. She is asking me if she can continue following here for DM II, I do not feel confortable with U-500 insulin.  She was supposed to follow with endocrinologists, recommended scheduling appt with Dr Lilia Argue. Caution with hypoglycemias.  Regular exercise and healthy diet with avoidance of added sugar food intake is an important part of treatment and recommended. Annual eye exam, periodic dental and foot care recommended.    -     Hemoglobin A1c -     Basic metabolic panel -     Fructosamine -     insulin regular human CONCENTRATED (HUMULIN R U-500 KWIKPEN) 500 UNIT/ML kwikpen; Inject 70-85 Units into the skin 3 (three) times daily with meals.  Essential hypertension  Not well controlled, resume antihypertensive medications. Possible complications of elevated BP discussed. Annual eye examination. Monitor BP at home and f/u in 3-4 months.  -     Basic metabolic panel -     amLODipine (NORVASC) 10 MG tablet; Take 1 tablet (10 mg total) by mouth daily. -      valsartan-hydrochlorothiazide (DIOVAN-HCT) 320-25 MG tablet; Take 1 tablet by mouth daily.  Vitamin D deficiency  No changes in current management, will follow labs done today and will give further recommendations accordingly.  -     VITAMIN D 25 Hydroxy (Vit-D Deficiency, Fractures)   Encouraged to quit smoking, adverse effects discussed.     [name]. [name], MD  [facility]. [place] office.

## 2015-07-23 NOTE — Patient Instructions (Signed)
A few things to remember from today's visit:   Essential hypertension - Plan: Basic metabolic panel, amLODipine (NORVASC) 10 MG tablet, valsartan-hydrochlorothiazide (DIOVAN-HCT) 320-25 MG tablet  Uncontrolled type 2 diabetes mellitus with insulin therapy ([place]) - Plan: Hemoglobin 123456, Basic metabolic panel, Fructosamine, insulin regular human CONCENTRATED (HUMULIN R U-500 KWIKPEN) 500 UNIT/ML kwikpen  Vitamin D deficiency - Plan: VITAMIN D 25 Hydroxy (Vit-D Deficiency, Fractures)  HgA1C goal < 7.0. Avoid sugar added food:regular soft drinks, energy drinks, and sports drinks. candy. cakes. cookies. pies and cobblers. sweet rolls, pastries, and donuts. fruit drinks, such as fruitades and fruit punch. dairy desserts, such as ice cream  Mediterranean diet has showed benefits for sugar control.  How much and what type of carbohydrate foods are important for managing diabetes. The balance between how much insulin is in your body and the carbohydrate you eat makes a difference in your blood glucose levels.  Fasting blood sugar ideally 130 or less, 2 hours after meals less than 180.   Regular exercise also will help with controlling disease, daily brisk walking as tolerated for 15-30 min definitively will help.   Avoid skipping meals, blood sugar might drop and cause serious problems. Remember checking feet periodically, good dental hygiene, and annual eye exam.    Please schedule appt with endocrinologists.   We have ordered labs or studies at this visit.  It can take up to 1-2 weeks for results and processing. IF results require follow up or explanation, we will call you with instructions. Clinically stable results will be released to your [facility]. If you have not heard from [place] or cannot find your results in [facility] in 2 weeks please contact our office at [phone].  If you are not yet signed up for [facility], please consider signing up  Please be sure medication list is accurate. If a  new problem present, please set up appointment sooner than planned today.   Resume blood pressure meds.

## 2015-07-23 NOTE — Progress Notes (Signed)
Pre visit review using our clinic review tool, if applicable. No additional management support is needed unless otherwise documented below in the visit note. 

## 2015-07-24 ENCOUNTER — Encounter: Payer: Self-pay | Admitting: Family Medicine

## 2015-07-28 ENCOUNTER — Encounter: Payer: Self-pay | Admitting: Family Medicine

## 2015-07-28 LAB — FRUCTOSAMINE: Fructosamine: 467 umol/L — ABNORMAL HIGH (ref 190–270)

## 2015-07-29 NOTE — Telephone Encounter (Signed)
Pt is return Sarah [name] call

## 2015-08-04 ENCOUNTER — Other Ambulatory Visit: Payer: Self-pay | Admitting: Family Medicine

## 2015-08-04 MED ORDER — DULAGLUTIDE 0.75 MG/0.5ML ~~LOC~~ SOAJ: SUBCUTANEOUS | 0 refills | Status: DC

## 2015-08-11 ENCOUNTER — Other Ambulatory Visit: Payer: Self-pay

## 2015-08-11 MED ORDER — INSULIN GLARGINE 100 UNIT/ML SOLOSTAR PEN: PEN_INJECTOR | SUBCUTANEOUS | 3 refills | Status: DC

## 2015-09-06 ENCOUNTER — Other Ambulatory Visit: Payer: Self-pay | Admitting: Internal Medicine

## 2015-09-08 NOTE — Telephone Encounter (Signed)
Rx refill sent to pharmacy. 

## 2015-09-19 ENCOUNTER — Other Ambulatory Visit: Payer: Self-pay | Admitting: Family Medicine

## 2015-09-25 ENCOUNTER — Encounter: Payer: Self-pay | Admitting: Family Medicine

## 2015-09-25 ENCOUNTER — Ambulatory Visit (INDEPENDENT_AMBULATORY_CARE_PROVIDER_SITE_OTHER): Payer: Medicare Other | Admitting: Family Medicine

## 2015-09-25 VITALS — BP 142/64 | Temp 98.2°F | Resp 12 | Ht 60.0 in | Wt 149.0 lb

## 2015-09-25 DIAGNOSIS — K644 Residual hemorrhoidal skin tags: Secondary | ICD-10-CM

## 2015-09-25 DIAGNOSIS — K610 Anal abscess: Secondary | ICD-10-CM

## 2015-09-25 DIAGNOSIS — K648 Other hemorrhoids: Secondary | ICD-10-CM

## 2015-09-25 MED ORDER — AMOXICILLIN-POT CLAVULANATE 875-125 MG PO TABS: 1.0000 | ORAL_TABLET | Freq: Two times a day (BID) | ORAL | 0 refills | Status: DC

## 2015-09-25 MED ORDER — HYDROCODONE-ACETAMINOPHEN 5-325 MG PO TABS: 1.0000 | ORAL_TABLET | Freq: Three times a day (TID) | ORAL | 0 refills | Status: DC | PRN

## 2015-09-25 MED ORDER — CEFTRIAXONE SODIUM 1 G IJ SOLR
1.0000 g | Freq: Once | INTRAMUSCULAR | Status: AC
  Administered 2015-09-25: 1 g via INTRAMUSCULAR

## 2015-09-25 MED ORDER — HYDROCORTISONE 2.5 % RE CREA: 1.0000 "application " | TOPICAL_CREAM | Freq: Two times a day (BID) | RECTAL | 0 refills | Status: DC

## 2015-09-25 NOTE — Patient Instructions (Addendum)
A few things to remember from today's visit:   Abscess, perianal - Plan: Ambulatory referral to General Surgery, CBC with Differential/Platelet  Inflamed external hemorrhoid - Plan: hydrocortisone (ANUSOL-HC) 2.5 % rectal cream   Sitz bath, monitor for signs of complications as discussed in which case he'll need to call 911. I will see you in 3 days. Today lab work depending on results, I may still need to see you to the ER.  If severe abdominal pain,. He is normal pain gets worse, persistent fever after 24 hours, nausea, vomiting please seek immediate medical attention.  Please be sure medication list is accurate. If a new problem present, please set up appointment sooner than planned today.

## 2015-09-25 NOTE — Progress Notes (Signed)
HPI:  ACUTE VISIT:  Chief Complaint  Patient presents with  . Hemorrhoids    [name] is a 70 y.o. female, who is here today complaining of "hemorrhoids", which she has had intermittently for years.   According to patient, about 2 days ago she started with anal pain, which she attributes to hemorrhoids. 3 days ago she had her last bowel movement, hard stool and had to strain some. She denies any blood in the stool. She has used OTC treatment, she doesn't remember name, has not helped at all.  Pain is constant, he can be "bad", exacerbated by sitting and alleviated by avoiding contact with area.  She also mentions mild intermittent lower abdominal discomfort and lower back pain, she noted fever yesterday, 101 F. She has had some chills and muscle aching, "Flu" like symptoms. She denies any sick contact or insect bite. No respiratory symptoms. Today she took  Ibuprofen today, 400 mg.  She denies any prior history of lower back pain, "not bad"; it is not radiated, not associated with buffered/urine incontinence, or saddle anesthesia.   Denies nausea, vomiting, changes in bowel habits, blood in stool or melena. She denies any dysuria, gross hematuria, urine frequency, or decreased urine output.   Hx of DM 2, she is reporting improvement of her BSs, less than 200, better than they did before treatment was adjusted. She denies any hypoglycemia.   Review of Systems  Constitutional: Positive for chills, fatigue and fever. Negative for appetite change and unexpected weight change.  HENT: Negative for congestion, mouth sores, nosebleeds, postnasal drip, sinus pressure and trouble swallowing.   Eyes: Negative for redness and visual disturbance.  Respiratory: Negative for cough, shortness of breath and wheezing.   Cardiovascular: Negative for chest pain, palpitations and leg swelling.  Gastrointestinal: Positive for abdominal pain and constipation. Negative for  blood in stool, nausea and vomiting.  Endocrine: Negative for polydipsia, polyphagia and polyuria.  Genitourinary: Negative for decreased urine volume, difficulty urinating, dysuria, hematuria and vaginal bleeding.  Musculoskeletal: Positive for back pain and myalgias. Negative for gait problem.  Skin: Negative for color change and rash.  Neurological: Negative for syncope, weakness and headaches.  Hematological: Negative for adenopathy. Does not bruise/bleed easily.      No current facility-administered medications on file prior to visit.    Current Outpatient Prescriptions on File Prior to Visit  Medication Sig Dispense Refill  . amLODipine (NORVASC) 10 MG tablet Take 1 tablet (10 mg total) by mouth daily. 90 tablet 1  . aspirin EC 81 MG tablet Take 81 mg by mouth daily.    Marland Kitchen atorvastatin (LIPITOR) 20 MG tablet Take 1 tablet (20 mg total) by mouth daily. 90 tablet 1  . Insulin Glargine (LANTUS) 100 UNIT/ML Solostar Pen Use 50 Units daily at the same time. May titrate up if FG >200. 15 mL 3  . Insulin Pen Needle 32G X 4 MM MISC Use up to 4x a day 200 each 11  . insulin regular human CONCENTRATED (HUMULIN R U-500 KWIKPEN) 500 UNIT/ML kwikpen Inject 70-85 Units into the skin 3 (three) times daily with meals. (Patient not taking: Reported on 09/26/2015) 4 pen 0  . metFORMIN (GLUCOPHAGE) 1000 MG tablet Take 1 tablet (1,000 mg total) by mouth 2 (two) times daily with a meal. (Patient taking differently: Take 1,000 mg by mouth 2 (two) times daily with a meal. Only takes once a day) 180 tablet 1  . TRULICITY [id number] 0000000 SOPN INJECT 0.5  ML UNDER THE SKIN WEEKLY 2 mL 1  . valsartan-hydrochlorothiazide (DIOVAN-HCT) 320-25 MG tablet Take 1 tablet by mouth daily. 90 tablet 1  . Vitamin D, Ergocalciferol, (DRISDOL) 50000 units CAPS capsule TAKE 1 CAPSULE BY MOUTH TWICE A WEEK ON MONDAY AND THURSDAY 8 capsule 1     Past Medical History:  Diagnosis Date  . Boils   . Glaucoma   . Heart murmur   .  HTN (hypertension)   . Hx of adenomatous colonic polyps   . Hyperlipidemia   . Osteoporosis   . Pneumonia   . Type II or unspecified type diabetes mellitus without mention of complication, uncontrolled    No Known Allergies  Social History   Social History  . Marital status: Single    Spouse name: N/A  . Number of children: N/A  . Years of education: N/A   Social History Main Topics  . Smoking status: Current Some Day Smoker  . Smokeless tobacco: Never Used     Comment: smokes occ.   Marland Kitchen Alcohol use 0.0 oz/week     Comment: occ  . Drug use: No  . Sexual activity: Not Asked   Other Topics Concern  . None   Social History Narrative  . None    Vitals:   09/25/15 1448  BP: (!) 142/64  Resp: 12  Temp: 98.2 F (36.8 C)   Body mass index is 29.1 kg/m.    Physical Exam  Nursing note and vitals reviewed. Constitutional: She is oriented to person, place, and time. She appears well-developed. She does not appear ill. She appears distressed (Due to pain).  HENT:  Head: Atraumatic.  Mouth/Throat: Oropharynx is clear and moist and mucous membranes are normal.  Eyes: Conjunctivae are normal.  Cardiovascular: Normal rate and regular rhythm.   Murmur (I/VI RUSB and LUSB) heard. Pulses:      Dorsalis pedis pulses are 2+ on the right side, and 2+ on the left side.  Respiratory: Effort normal and breath sounds normal. No respiratory distress.  GI: Soft. She exhibits no mass. There is no hepatomegaly. There is no tenderness. There is no CVA tenderness.  Genitourinary: Rectal exam shows external hemorrhoid and tenderness. Rectal exam shows no fissure and no mass.     Genitourinary Comments: External hemorrhoid is noted, mild erythema, no thrombus appreciated. Moderate tenderness upon palpation, she didn't tolerate digital rectal exam due to pain. Noted marked tenderness upon palpation under the coccygeal area towards left gluteus, local heat and induration, nodular like lesion  palpated, not visible upon inspection. No fluctuant area.  Musculoskeletal: She exhibits no edema.  No tenderness upon palpation of paraspinal lumbar muscles.  Neurological: She is alert and oriented to person, place, and time. She has normal strength. Coordination and gait normal.  Skin: Skin is warm. No rash noted. No erythema.  Psychiatric: She has a normal mood and affect.  Well groomed, good eye contact.      ASSESSMENT AND PLAN:     Ruchoma was seen today for hemorrhoids.  Diagnoses and all orders for this visit:  Abscess, perianal  Clinical findings today suggest the beginning of perineal abscess, this could certainly explain fever and myalgias. We couldn't arrange appointment with surgeon today or tomorrow, next available is next week, 09/30/15. Because DM II I recommended going to the ER, she may need IV abx but she refused to do so.  Here in office after verbal consent Rocephin 1 g IM given. Oral Abx started. I would like to  see her Monday for follow up. Sitz bath may also help. Clearly instructed about warning signs.  -     Ambulatory referral to General Surgery -     HYDROcodone-acetaminophen (NORCO/VICODIN) 5-325 MG tablet; Take 1 tablet by mouth every 8 (eight) hours as needed for moderate pain. -     amoxicillin-clavulanate (AUGMENTIN) 875-125 MG tablet; Take 1 tablet by mouth 2 (two) times daily. (Patient not taking: Reported on 09/26/2015) -     CBC with Differential/Platelet -     cefTRIAXone (ROCEPHIN) injection 1 g; Inject 1 g into the muscle once.  Inflamed external hemorrhoid  Mild-moderate. Topical treatment recommended. Avoid constipation, adequate hydration and fiber intake. Bath sitz.  -     hydrocortisone (ANUSOL-HC) 2.5 % rectal cream; Place 1 application rectally 2 (two) times daily. -     cefTRIAXone (ROCEPHIN) injection 1 g; Inject 1 g into the muscle once.    She will keep appt with surgeon, understands warning signs; in which case she  needs to go to ER, today CBC done. She understands also that if lab results are concerning she will be contacted and sent to the ER.     Return in about 4 days (around 09/29/2015) for perianal abscess.     -Ms.LOETA EICHEL was advised to return or notify a doctor immediately if symptoms worsen or persist or new concerns arise.       Kassady Laboy G. [name], MD  [facility]. [place] office.

## 2015-09-26 ENCOUNTER — Inpatient Hospital Stay (HOSPITAL_COMMUNITY)
Admission: EM | Admit: 2015-09-26 | Discharge: 2015-09-29 | DRG: 395 | Disposition: A | Discharge status: Home / Self Care | Payer: Medicare Other | Attending: General Surgery | Admitting: General Surgery

## 2015-09-26 ENCOUNTER — Emergency Department (HOSPITAL_COMMUNITY): Payer: Medicare Other

## 2015-09-26 ENCOUNTER — Encounter (HOSPITAL_COMMUNITY): Admission: EM | Disposition: A | Discharge status: Home / Self Care | Payer: Self-pay | Source: Home / Self Care

## 2015-09-26 ENCOUNTER — Telehealth: Payer: Self-pay

## 2015-09-26 ENCOUNTER — Encounter (HOSPITAL_COMMUNITY): Payer: Self-pay | Admitting: Emergency Medicine

## 2015-09-26 ENCOUNTER — Emergency Department (HOSPITAL_COMMUNITY): Payer: Medicare Other | Admitting: Certified Registered"

## 2015-09-26 DIAGNOSIS — K611 Rectal abscess: Secondary | ICD-10-CM | POA: Diagnosis not present

## 2015-09-26 DIAGNOSIS — E559 Vitamin D deficiency, unspecified: Secondary | ICD-10-CM | POA: Diagnosis present

## 2015-09-26 DIAGNOSIS — Z794 Long term (current) use of insulin: Secondary | ICD-10-CM | POA: Diagnosis not present

## 2015-09-26 DIAGNOSIS — D729 Disorder of white blood cells, unspecified: Secondary | ICD-10-CM

## 2015-09-26 DIAGNOSIS — E119 Type 2 diabetes mellitus without complications: Secondary | ICD-10-CM | POA: Diagnosis not present

## 2015-09-26 DIAGNOSIS — K5900 Constipation, unspecified: Secondary | ICD-10-CM

## 2015-09-26 DIAGNOSIS — F17200 Nicotine dependence, unspecified, uncomplicated: Secondary | ICD-10-CM | POA: Diagnosis present

## 2015-09-26 DIAGNOSIS — E1165 Type 2 diabetes mellitus with hyperglycemia: Secondary | ICD-10-CM | POA: Diagnosis not present

## 2015-09-26 DIAGNOSIS — E785 Hyperlipidemia, unspecified: Secondary | ICD-10-CM | POA: Diagnosis not present

## 2015-09-26 DIAGNOSIS — B373 Candidiasis of vulva and vagina: Secondary | ICD-10-CM

## 2015-09-26 DIAGNOSIS — I10 Essential (primary) hypertension: Secondary | ICD-10-CM | POA: Diagnosis not present

## 2015-09-26 DIAGNOSIS — D72829 Elevated white blood cell count, unspecified: Secondary | ICD-10-CM | POA: Diagnosis not present

## 2015-09-26 DIAGNOSIS — M810 Age-related osteoporosis without current pathological fracture: Secondary | ICD-10-CM | POA: Diagnosis not present

## 2015-09-26 DIAGNOSIS — Z79899 Other long term (current) drug therapy: Secondary | ICD-10-CM | POA: Diagnosis not present

## 2015-09-26 DIAGNOSIS — Z86010 Personal history of colonic polyps: Secondary | ICD-10-CM

## 2015-09-26 DIAGNOSIS — K610 Anal abscess: Secondary | ICD-10-CM

## 2015-09-26 DIAGNOSIS — Z7982 Long term (current) use of aspirin: Secondary | ICD-10-CM

## 2015-09-26 DIAGNOSIS — R1030 Lower abdominal pain, unspecified: Secondary | ICD-10-CM | POA: Diagnosis not present

## 2015-09-26 DIAGNOSIS — K649 Unspecified hemorrhoids: Secondary | ICD-10-CM

## 2015-09-26 DIAGNOSIS — B3731 Acute candidiasis of vulva and vagina: Secondary | ICD-10-CM

## 2015-09-26 HISTORY — PX: INCISION AND DRAINAGE PERIRECTAL ABSCESS: SHX1804

## 2015-09-26 LAB — CBC WITH DIFFERENTIAL/PLATELET
Basophils Absolute: 0 10*3/uL (ref 0.0–0.1)
Basophils Absolute: 0 10*3/uL (ref 0.0–0.1)
Basophils Relative: 0 % (ref 0.0–3.0)
Basophils Relative: 0 %
Eosinophils Absolute: 0 10*3/uL (ref 0.0–0.7)
Eosinophils Absolute: 0 10*3/uL (ref 0.0–0.7)
Eosinophils Relative: 0.2 % (ref 0.0–5.0)
Eosinophils Relative: 0 %
HCT: 37.5 % (ref 36.0–46.0)
HCT: 38.1 % (ref 36.0–46.0)
Hemoglobin: 12.5 g/dL (ref 12.0–15.0)
Hemoglobin: 12.5 g/dL (ref 12.0–15.0)
Lymphocytes Relative: 12.8 % (ref 12.0–46.0)
Lymphocytes Relative: 11 %
Lymphs Abs: 2.4 10*3/uL (ref 0.7–4.0)
Lymphs Abs: 2.3 10*3/uL (ref 0.7–4.0)
MCH: 31.1 pg (ref 26.0–34.0)
MCHC: 33.3 g/dL (ref 30.0–36.0)
MCHC: 32.8 g/dL (ref 30.0–36.0)
MCV: 93.2 fl (ref 78.0–100.0)
MCV: 94.8 fL (ref 78.0–100.0)
Monocytes Absolute: 1.2 10*3/uL — ABNORMAL HIGH (ref 0.1–1.0)
Monocytes Absolute: 1.3 10*3/uL — ABNORMAL HIGH (ref 0.1–1.0)
Monocytes Relative: 6.5 % (ref 3.0–12.0)
Monocytes Relative: 6 %
Neutro Abs: 15.2 10*3/uL — ABNORMAL HIGH (ref 1.4–7.7)
Neutro Abs: 16.5 10*3/uL — ABNORMAL HIGH (ref 1.7–7.7)
Neutrophils Relative %: 80.5 % — ABNORMAL HIGH (ref 43.0–77.0)
Neutrophils Relative %: 83 %
Platelets: 327 10*3/uL (ref 150.0–400.0)
Platelets: 333 10*3/uL (ref 150–400)
RBC: 4.03 Mil/uL (ref 3.87–5.11)
RBC: 4.02 MIL/uL (ref 3.87–5.11)
RDW: 13.3 % (ref 11.5–15.5)
RDW: 12.6 % (ref 11.5–15.5)
WBC: 18.9 10*3/uL (ref 4.0–10.5)
WBC: 20.1 10*3/uL — ABNORMAL HIGH (ref 4.0–10.5)

## 2015-09-26 LAB — URINALYSIS, ROUTINE W REFLEX MICROSCOPIC
Glucose, UA: 250 mg/dL — AB
Hgb urine dipstick: NEGATIVE
Ketones, ur: NEGATIVE mg/dL
Leukocytes, UA: NEGATIVE
Nitrite: NEGATIVE
Protein, ur: 100 mg/dL — AB
Specific Gravity, Urine: 1.031 — ABNORMAL HIGH (ref 1.005–1.030)
pH: 5.5 (ref 5.0–8.0)

## 2015-09-26 LAB — GLUCOSE, CAPILLARY
Glucose-Capillary: 163 mg/dL — ABNORMAL HIGH (ref 65–99)
Glucose-Capillary: 171 mg/dL — ABNORMAL HIGH (ref 65–99)

## 2015-09-26 LAB — BASIC METABOLIC PANEL
Anion gap: 10 (ref 5–15)
BUN: 15 mg/dL (ref 6–20)
CO2: 24 mmol/L (ref 22–32)
Calcium: 9.8 mg/dL (ref 8.9–10.3)
Chloride: 104 mmol/L (ref 101–111)
Creatinine, Ser: 0.65 mg/dL (ref 0.44–1.00)
GFR calc Af Amer: >60 mL/min (ref >60)
GFR calc non Af Amer: >60 mL/min (ref >60)
Glucose, Bld: 234 mg/dL — ABNORMAL HIGH (ref 65–99)
Potassium: 3.9 mmol/L (ref 3.5–5.1)
Sodium: 138 mmol/L (ref 135–145)

## 2015-09-26 LAB — URINE MICROSCOPIC-ADD ON

## 2015-09-26 LAB — I-STAT CG4 LACTIC ACID, ED: Lactic Acid, Venous: 0.99 mmol/L (ref 0.5–1.9)

## 2015-09-26 LAB — CBG MONITORING, ED: Glucose-Capillary: 152 mg/dL — ABNORMAL HIGH (ref 65–99)

## 2015-09-26 LAB — POC OCCULT BLOOD, ED: Fecal Occult Bld: NEGATIVE

## 2015-09-26 SURGERY — INCISION AND DRAINAGE, ABSCESS, PERIRECTAL
Anesthesia: General | Site: Rectum

## 2015-09-26 MED ORDER — ONDANSETRON 4 MG PO TBDP: 4.0000 mg | ORAL_TABLET | Freq: Four times a day (QID) | ORAL | Status: DC | PRN

## 2015-09-26 MED ORDER — ONDANSETRON HCL 4 MG/2ML IJ SOLN: 4.0000 mg | Freq: Four times a day (QID) | INTRAMUSCULAR | Status: DC | PRN

## 2015-09-26 MED ORDER — AMLODIPINE BESYLATE 10 MG PO TABS
10.0000 mg | ORAL_TABLET | Freq: Every day | ORAL | Status: DC
  Administered 2015-09-26 – 2015-09-29 (×3): 10 mg via ORAL
  Filled 2015-09-26 (×4): qty 1

## 2015-09-26 MED ORDER — ACETAMINOPHEN 650 MG RE SUPP: 650.0000 mg | Freq: Four times a day (QID) | RECTAL | Status: DC | PRN

## 2015-09-26 MED ORDER — MIDAZOLAM HCL 2 MG/2ML IJ SOLN
INTRAMUSCULAR | Status: AC
  Filled 2015-09-26: qty 2

## 2015-09-26 MED ORDER — VANCOMYCIN HCL IN DEXTROSE 1-5 GM/200ML-% IV SOLN: 1000.0000 mg | Freq: Once | INTRAVENOUS | Status: DC

## 2015-09-26 MED ORDER — ACETAMINOPHEN 325 MG PO TABS: 650.0000 mg | ORAL_TABLET | Freq: Four times a day (QID) | ORAL | Status: DC | PRN

## 2015-09-26 MED ORDER — PIPERACILLIN-TAZOBACTAM 3.375 G IVPB
3.3750 g | Freq: Three times a day (TID) | INTRAVENOUS | Status: DC
Start: 2015-09-26 — End: 2015-09-29
  Administered 2015-09-26 – 2015-09-29 (×8): 3.375 g via INTRAVENOUS
  Filled 2015-09-26 (×10): qty 50

## 2015-09-26 MED ORDER — ACETAMINOPHEN 325 MG PO TABS
650.0000 mg | ORAL_TABLET | Freq: Once | ORAL | Status: AC
  Administered 2015-09-26: 650 mg via ORAL
  Filled 2015-09-26: qty 2

## 2015-09-26 MED ORDER — IOPAMIDOL (ISOVUE-300) INJECTION 61%
INTRAVENOUS | Status: AC
  Administered 2015-09-26: 100 mL
  Filled 2015-09-26: qty 100

## 2015-09-26 MED ORDER — INSULIN ASPART 100 UNIT/ML ~~LOC~~ SOLN
0.0000 [IU] | Freq: Three times a day (TID) | SUBCUTANEOUS | Status: DC
  Administered 2015-09-27: 5 [IU] via SUBCUTANEOUS
  Administered 2015-09-27: 8 [IU] via SUBCUTANEOUS
  Administered 2015-09-28: 2 [IU] via SUBCUTANEOUS
  Administered 2015-09-28: 5 [IU] via SUBCUTANEOUS
  Administered 2015-09-29: 3 [IU] via SUBCUTANEOUS

## 2015-09-26 MED ORDER — MORPHINE SULFATE (PF) 2 MG/ML IV SOLN
2.0000 mg | INTRAVENOUS | Status: DC | PRN
  Administered 2015-09-26: 2 mg via INTRAVENOUS
  Filled 2015-09-26: qty 1

## 2015-09-26 MED ORDER — SODIUM CHLORIDE 0.9 % IV SOLN
1250.0000 mg | INTRAVENOUS | Status: DC
  Administered 2015-09-26 – 2015-09-28 (×3): 1250 mg via INTRAVENOUS
  Filled 2015-09-26 (×5): qty 1250

## 2015-09-26 MED ORDER — SODIUM CHLORIDE 0.9 % IV SOLN
1000.0000 mL | INTRAVENOUS | Status: DC
  Administered 2015-09-26 (×2): 1000 mL via INTRAVENOUS

## 2015-09-26 MED ORDER — ENOXAPARIN SODIUM 40 MG/0.4ML ~~LOC~~ SOLN
40.0000 mg | SUBCUTANEOUS | Status: DC
  Administered 2015-09-26 – 2015-09-28 (×3): 40 mg via SUBCUTANEOUS
  Filled 2015-09-26 (×3): qty 0.4

## 2015-09-26 MED ORDER — ATORVASTATIN CALCIUM 20 MG PO TABS
20.0000 mg | ORAL_TABLET | Freq: Every day | ORAL | Status: DC
  Administered 2015-09-26 – 2015-09-29 (×4): 20 mg via ORAL
  Filled 2015-09-26 (×4): qty 1

## 2015-09-26 MED ORDER — HYDROCODONE-ACETAMINOPHEN 5-325 MG PO TABS
1.0000 | ORAL_TABLET | ORAL | Status: DC | PRN
  Administered 2015-09-27 – 2015-09-29 (×5): 2 via ORAL
  Filled 2015-09-26 (×5): qty 2

## 2015-09-26 MED ORDER — MORPHINE SULFATE (PF) 4 MG/ML IV SOLN
4.0000 mg | Freq: Once | INTRAVENOUS | Status: AC
  Administered 2015-09-26: 4 mg via INTRAVENOUS
  Filled 2015-09-26: qty 1

## 2015-09-26 MED ORDER — ONDANSETRON HCL 4 MG/2ML IJ SOLN
4.0000 mg | Freq: Once | INTRAMUSCULAR | Status: AC
  Administered 2015-09-26: 4 mg via INTRAVENOUS
  Filled 2015-09-26: qty 2

## 2015-09-26 MED ORDER — ASPIRIN EC 81 MG PO TBEC
81.0000 mg | DELAYED_RELEASE_TABLET | Freq: Every day | ORAL | Status: DC
  Administered 2015-09-26 – 2015-09-29 (×4): 81 mg via ORAL
  Filled 2015-09-26 (×4): qty 1

## 2015-09-26 MED ORDER — 0.9 % SODIUM CHLORIDE (POUR BTL) OPTIME
TOPICAL | Status: DC | PRN
  Administered 2015-09-26: 1000 mL

## 2015-09-26 MED ORDER — LIDOCAINE 2% (20 MG/ML) 5 ML SYRINGE
INTRAMUSCULAR | Status: DC | PRN
  Administered 2015-09-26: 100 mg via INTRAVENOUS

## 2015-09-26 MED ORDER — PROPOFOL 10 MG/ML IV BOLUS
INTRAVENOUS | Status: AC
  Filled 2015-09-26: qty 20

## 2015-09-26 MED ORDER — FENTANYL CITRATE (PF) 100 MCG/2ML IJ SOLN
INTRAMUSCULAR | Status: AC
  Filled 2015-09-26: qty 2

## 2015-09-26 MED ORDER — INSULIN GLARGINE 100 UNIT/ML ~~LOC~~ SOLN
50.0000 [IU] | Freq: Every day | SUBCUTANEOUS | Status: DC
  Administered 2015-09-26 – 2015-09-28 (×3): 50 [IU] via SUBCUTANEOUS
  Filled 2015-09-26 (×4): qty 0.5

## 2015-09-26 MED ORDER — FENTANYL CITRATE (PF) 100 MCG/2ML IJ SOLN
INTRAMUSCULAR | Status: DC | PRN
  Administered 2015-09-26 (×2): 50 ug via INTRAVENOUS

## 2015-09-26 MED ORDER — MORPHINE SULFATE (PF) 2 MG/ML IV SOLN
2.0000 mg | Freq: Once | INTRAVENOUS | Status: AC
  Administered 2015-09-26: 2 mg via INTRAVENOUS
  Filled 2015-09-26: qty 1

## 2015-09-26 MED ORDER — PIPERACILLIN-TAZOBACTAM 3.375 G IVPB 30 MIN
3.3750 g | Freq: Once | INTRAVENOUS | Status: AC
  Administered 2015-09-26: 3.375 g via INTRAVENOUS
  Filled 2015-09-26: qty 50

## 2015-09-26 MED ORDER — PROPOFOL 10 MG/ML IV BOLUS
INTRAVENOUS | Status: DC | PRN
  Administered 2015-09-26: 150 mg via INTRAVENOUS

## 2015-09-26 MED ORDER — LACTATED RINGERS IV SOLN
INTRAVENOUS | Status: DC
  Administered 2015-09-26: 19:00:00 via INTRAVENOUS

## 2015-09-26 SURGICAL SUPPLY — 34 items
BANDAGE GAUZE 4  KLING STR (GAUZE/BANDAGES/DRESSINGS) ×2 IMPLANT
BLADE SURG 15 STRL LF DISP TIS (BLADE) IMPLANT
BLADE SURG 15 STRL SS (BLADE) ×3
BNDG GAUZE ELAST 4 BULKY (GAUZE/BANDAGES/DRESSINGS) IMPLANT
BRIEF STRETCH FOR OB PAD LRG (UNDERPADS AND DIAPERS) IMPLANT
COVER MAYO STAND STRL (DRAPES) ×3 IMPLANT
COVER SURGICAL LIGHT HANDLE (MISCELLANEOUS) ×3 IMPLANT
DRSG PAD ABDOMINAL 8X10 ST (GAUZE/BANDAGES/DRESSINGS) ×2 IMPLANT
ELECT CAUTERY BLADE 6.4 (BLADE) ×3 IMPLANT
ELECT REM PT RETURN 9FT ADLT (ELECTROSURGICAL) ×3
ELECTRODE REM PT RTRN 9FT ADLT (ELECTROSURGICAL) ×1 IMPLANT
GAUZE SPONGE 4X4 12PLY STRL (GAUZE/BANDAGES/DRESSINGS) IMPLANT
GLOVE BIOGEL PI IND STRL 7.5 (GLOVE) ×1 IMPLANT
GLOVE BIOGEL PI INDICATOR 7.5 (GLOVE) ×2
GLOVE SURG SS PI 7.0 STRL IVOR (GLOVE) ×3 IMPLANT
GOWN STRL REUS W/ TWL LRG LVL3 (GOWN DISPOSABLE) ×2 IMPLANT
GOWN STRL REUS W/TWL LRG LVL3 (GOWN DISPOSABLE) ×6
KIT BASIN OR (CUSTOM PROCEDURE TRAY) ×3 IMPLANT
KIT ROOM TURNOVER OR (KITS) ×3 IMPLANT
NS IRRIG 1000ML POUR BTL (IV SOLUTION) ×3 IMPLANT
PACK LITHOTOMY IV (CUSTOM PROCEDURE TRAY) ×3 IMPLANT
PAD ARMBOARD 7.5X6 YLW CONV (MISCELLANEOUS) ×3 IMPLANT
PENCIL BUTTON HOLSTER BLD 10FT (ELECTRODE) ×3 IMPLANT
SOL PREP POV-IOD 4OZ 10% (MISCELLANEOUS) ×2 IMPLANT
SPONGE HEMORRHOID 8X3CM (HEMOSTASIS) IMPLANT
SPONGE LAP 18X18 X RAY DECT (DISPOSABLE) ×3 IMPLANT
SURGILUBE 2OZ TUBE FLIPTOP (MISCELLANEOUS) ×3 IMPLANT
SYR BULB 3OZ (MISCELLANEOUS) ×3 IMPLANT
SYR BULB IRRIGATION 50ML (SYRINGE) ×2 IMPLANT
TOWEL OR 17X24 6PK STRL BLUE (TOWEL DISPOSABLE) ×3 IMPLANT
TOWEL OR 17X26 10 PK STRL BLUE (TOWEL DISPOSABLE) ×3 IMPLANT
TUBE CONNECTING 12'X1/4 (SUCTIONS) ×1
TUBE CONNECTING 12X1/4 (SUCTIONS) ×2 IMPLANT
YANKAUER SUCT BULB TIP NO VENT (SUCTIONS) ×3 IMPLANT

## 2015-09-26 NOTE — Telephone Encounter (Signed)
Patient was informed on lab results and told to go to ER per Dr. [name]. Patient verbalized understanding and agreed to go to ER.

## 2015-09-26 NOTE — Progress Notes (Addendum)
Pharmacy Antibiotic Note  Sarah [name] is a 70 y.o. female admitted on 09/26/2015 with intra-abdominal infection and perianal abscess.  Pharmacy has been consulted for vancomycin and zosyn dosing.  Plan: Vancomycin 1250mg  IV every 24 hours.  Goal trough 10-15 mcg/mL. Zosyn 3.375g IV q8h (4 hour infusion).  Monitor culture data, renal function and clinical course  Height: 5' (152.4 cm) Weight: 151 lb (68.5 kg) IBW/kg (Calculated) : 45.5  Temp (24hrs), Avg:98.9 F (37.2 C), Min:98.9 F (37.2 C), Max:98.9 F (37.2 C)   Recent Labs Lab 09/25/15 1612 09/26/15 1330 09/26/15 1350  WBC 18.9 Repeated and verified X2.* 20.1*  --   CREATININE  --  0.65  --   LATICACIDVEN  --   --  0.99    Estimated Creatinine Clearance: 58.1 mL/min (by C-G formula based on SCr of 0.65 mg/dL).    No Known Allergies  Antimicrobials this admission: Zosyn 9/15 >>  Vanc 9/15 >>  Dose adjustments this admission:   Microbiology results:  BCx:   UCx:    Sputum:    MRSA PCR:    Andrey Cota. Diona Foley, PharmD, [place] Clinical Pharmacist Pager [phone] 09/26/2015 4:42 PM

## 2015-09-26 NOTE — Op Note (Signed)
Preoperative diagnosis: perirectal abscess  Postoperative diagnosis: same   Procedure: incision and drainage of perirectal abscess  Surgeon: Gurney Maxin, M.D.  Asst: none  Anesthesia: gen  Indications for procedure: JACKLYN RUESCH is a 70 y.o. year old female with symptoms of anal pain and fever found to have a posterior perirectal abscess.Marland Kitchen  Description of procedure: The patient was brought into the operative suite. Anesthesia was administered with General LMA anesthesia. WHO checklist was applied. The patient was then placed in lithotomy position. The area was prepped and draped in the usual sterile fashion.  Next an incision was made over the area of most fluctuance on the left posterior gluteus. Blunt dissection was used to get into the cavity and large amount of white yellow purulence was drained, portion of this was sent for culture. Next counterincision was made in the posterior mid gluteal area approximately 3 cm from the anal verge. Tunnel was made between the 2 through the abscess cavity and a quarter inch Penrose was brought through this area and tied multiple place with a 2-0 silk. The area was then irrigated with copious amount of saline. A small Kerlix was packed in the area. A bandage was put in place for dressing patient awoke from anesthesia prepped PACU in stable condition.  Findings: abscess  Specimen: culture of abscess  Implant: 1/4" penrose and kerlix packing   Blood loss: 14ml  Local anesthesia:   Complications: none  Gurney Maxin, M.D. General, Bariatric, & Minimally Invasive Surgery [facility]

## 2015-09-26 NOTE — Anesthesia Preprocedure Evaluation (Signed)
Anesthesia Evaluation  Patient identified by MRN, date of birth, ID band Patient awake    Reviewed: Allergy & Precautions, NPO status , Patient's Chart, lab work & pertinent test results  Airway Mallampati: II  TM Distance: >3 FB Neck ROM: Full    Dental  (+) Teeth Intact, Dental Advisory Given   Pulmonary Current Smoker,    Pulmonary exam normal breath sounds clear to auscultation       Cardiovascular hypertension, Pt. on medications Normal cardiovascular exam Rhythm:Regular Rate:Normal     Neuro/Psych negative neurological ROS  negative psych ROS   GI/Hepatic negative GI ROS, Neg liver ROS,   Endo/Other  diabetes, Poorly Controlled, Type 2, Oral Hypoglycemic Agents, Insulin Dependent  Renal/GU negative Renal ROS     Musculoskeletal negative musculoskeletal ROS (+)   Abdominal   Peds  Hematology negative hematology ROS (+)   Anesthesia Other Findings Day of surgery medications reviewed with the patient.  Reproductive/Obstetrics negative OB ROS                             Anesthesia Physical Anesthesia Plan  ASA: III  Anesthesia Plan: General   Post-op Pain Management:    Induction: Intravenous  Airway Management Planned: Oral ETT  Additional Equipment:   Intra-op Plan:   Post-operative Plan: Extubation in OR  Informed Consent: I have reviewed the patients History and Physical, chart, labs and discussed the procedure including the risks, benefits and alternatives for the proposed anesthesia with the patient or authorized representative who has indicated his/her understanding and acceptance.   Dental advisory given  Plan Discussed with: CRNA  Anesthesia Plan Comments: (Risks/benefits of general anesthesia discussed with patient including risk of damage to teeth, lips, gum, and tongue, nausea/vomiting, allergic reactions to medications, and the possibility of heart attack,  stroke and death.  All patient questions answered.  Patient wishes to proceed.)        Anesthesia Quick Evaluation

## 2015-09-26 NOTE — ED Triage Notes (Addendum)
Has  Seen at [facility] yesterday for her Hemorids,  and she had labs drawn and they called her today and told her her WBC count was to high and she needed to come to er,  Was given shot for pain yesteday and antiobiotics and po pain meds

## 2015-09-26 NOTE — H&P (Signed)
Sarah [name] is an 70 y.o. female.   Chief Complaint: anal pain HPI: 70 year old female with one-week history of perianal pain. Initially she thought it was a hemorrhoid and put hemorrhoid cream on it without benefit. She saw her primary care physician was prescribed antibiotics and told to go to surgical office for drainage, however, next a her physician culture telling her to go to the ER due to abnormal laboratory values. She's had a fever up to 101. She has no prior history of abscesses or skin infections. Her blood sugars run between  130 and 160 this week.  Past Medical History:  Diagnosis Date  . Boils   . Glaucoma   . Heart murmur   . HTN (hypertension)   . Hx of adenomatous colonic polyps   . Hyperlipidemia   . Osteoporosis   . Pneumonia   . Type II or unspecified type diabetes mellitus without mention of complication, uncontrolled     Past Surgical History:  Procedure Laterality Date  . ABDOMINAL HYSTERECTOMY    . CARPAL TUNNEL RELEASE     right  . COLONOSCOPY  12-10-10   per Dr. Deatra Ina, clear, repeat in 7 yrs   . KNEE ARTHROSCOPY     right  . ORIF ANKLE FRACTURE Right 03/24/2012   Procedure: OPEN REDUCTION INTERNAL FIXATION (ORIF) ANKLE FRACTURE;  Surgeon: Newt Minion, MD;  Location: [facility];  Service: Orthopedics;  Laterality: Right;  Open Reduction Internal Fixation Right Bimalleolar ankle fracture  . POLYPECTOMY    . TONSILLECTOMY      Family History  Problem Relation Age of Onset  . Diabetes Mother   . Hypertension Mother   . Colon cancer Neg Hx   . Esophageal cancer Neg Hx   . Rectal cancer Neg Hx   . Stomach cancer Neg Hx    Social History:  reports that she has been smoking.  She has never used smokeless tobacco. She reports that she drinks alcohol. She reports that she does not use drugs.  Allergies: No Known Allergies   (Not in a hospital admission)  Results for orders placed or performed during the hospital encounter of 09/26/15 (from the  past 48 hour(s))  Urinalysis, Routine w reflex microscopic (not at [facility])     Status: Abnormal   Collection Time: 09/26/15  1:14 PM  Result Value Ref Range   Color, Urine AMBER (A) YELLOW    Comment: BIOCHEMICALS MAY BE AFFECTED BY COLOR   APPearance CLOUDY (A) CLEAR   Specific Gravity, Urine 1.031 (H) 1.005 - 1.030   pH 5.5 5.0 - 8.0   Glucose, UA 250 (A) NEGATIVE mg/dL   Hgb urine dipstick NEGATIVE NEGATIVE   Bilirubin Urine SMALL (A) NEGATIVE   Ketones, ur NEGATIVE NEGATIVE mg/dL   Protein, ur 100 (A) NEGATIVE mg/dL   Nitrite NEGATIVE NEGATIVE   Leukocytes, UA NEGATIVE NEGATIVE  Urine microscopic-add on     Status: Abnormal   Collection Time: 09/26/15  1:14 PM  Result Value Ref Range   Squamous Epithelial / LPF 6-30 (A) NONE SEEN   WBC, UA 0-5 0 - 5 WBC/hpf   RBC / HPF 0-5 0 - 5 RBC/hpf   Bacteria, UA RARE (A) NONE SEEN   Urine-Other YEAST PRESENT   CBC with Differential     Status: Abnormal   Collection Time: 09/26/15  1:30 PM  Result Value Ref Range   WBC 20.1 (H) 4.0 - 10.5 K/uL   RBC 4.02 3.87 - 5.11 MIL/uL  Hemoglobin 12.5 12.0 - 15.0 g/dL   HCT 38.1 36.0 - 46.0 %   MCV 94.8 78.0 - 100.0 fL   MCH 31.1 26.0 - 34.0 pg   MCHC 32.8 30.0 - 36.0 g/dL   RDW 12.6 11.5 - 15.5 %   Platelets 333 150 - 400 K/uL   Neutrophils Relative % 83 %   Neutro Abs 16.5 (H) 1.7 - 7.7 K/uL   Lymphocytes Relative 11 %   Lymphs Abs 2.3 0.7 - 4.0 K/uL   Monocytes Relative 6 %   Monocytes Absolute 1.3 (H) 0.1 - 1.0 K/uL   Eosinophils Relative 0 %   Eosinophils Absolute 0.0 0.0 - 0.7 K/uL   Basophils Relative 0 %   Basophils Absolute 0.0 0.0 - 0.1 K/uL  Basic metabolic panel     Status: Abnormal   Collection Time: 09/26/15  1:30 PM  Result Value Ref Range   Sodium 138 135 - 145 mmol/L   Potassium 3.9 3.5 - 5.1 mmol/L   Chloride 104 101 - 111 mmol/L   CO2 24 22 - 32 mmol/L   Glucose, Bld 234 (H) 65 - 99 mg/dL   BUN 15 6 - 20 mg/dL   Creatinine, Ser 0.65 0.44 - 1.00 mg/dL   Calcium  9.8 8.9 - 10.3 mg/dL   GFR calc non Af Amer >60 >60 mL/min   GFR calc Af Amer >60 >60 mL/min    Comment: (NOTE) The eGFR has been calculated using the CKD EPI equation. This calculation has not been validated in all clinical situations. eGFR's persistently <60 mL/min signify possible Chronic Kidney Disease.    Anion gap 10 5 - 15  Culture, blood (Routine X 2) w Reflex to ID Panel     Status: None (Preliminary result)   Collection Time: 09/26/15  1:30 PM  Result Value Ref Range   Specimen Description BLOOD RIGHT ANTECUBITAL    Special Requests BOTTLES DRAWN AEROBIC AND ANAEROBIC 5CC    Culture NO GROWTH <12 HOURS    Report Status PENDING   POC occult blood, ED     Status: None   Collection Time: 09/26/15  1:30 PM  Result Value Ref Range   Fecal Occult Bld NEGATIVE NEGATIVE  Culture, blood (Routine X 2) w Reflex to ID Panel     Status: None (Preliminary result)   Collection Time: 09/26/15  1:40 PM  Result Value Ref Range   Specimen Description BLOOD LEFT ANTECUBITAL    Special Requests BOTTLES DRAWN AEROBIC AND ANAEROBIC 5CC    Culture NO GROWTH <12 HOURS    Report Status PENDING   I-Stat CG4 Lactic Acid, ED     Status: None   Collection Time: 09/26/15  1:50 PM  Result Value Ref Range   Lactic Acid, Venous 0.99 0.5 - 1.9 mmol/L   Ct Abdomen Pelvis W Contrast  Result Date: 09/26/2015 CLINICAL DATA:  BILATERAL lower quadrant abdominal pain for 4 days, history hemorrhoids, hypertension, diabetes mellitus, smoking EXAM: CT ABDOMEN AND PELVIS WITH CONTRAST TECHNIQUE: Multidetector CT imaging of the abdomen and pelvis was performed using the standard protocol following bolus administration of intravenous contrast. Sagittal and coronal MPR images reconstructed from axial data set. CONTRAST:  174m ISOVUE-300 IOPAMIDOL (ISOVUE-300) INJECTION 61% IV. No oral contrast administered. COMPARISON:  09/24/2011 FINDINGS: Lower chest: Lung bases clear. Hepatobiliary: Gallbladder and liver normal  appearance Pancreas: Normal appearance Spleen: Normal appearance Adrenals/Urinary Tract: BILATERAL mild thickening of adrenal glands without discrete mass. Kidneys, ureters, and bladder normal appearance Stomach/Bowel:  Normal appendix. Bowel wall thickening of rectum/anus with perirectal and perianal soft tissue infiltration compatible with infection. Poorly defined fluid collection dorsal to the rectum and anus, 3.6 x 2.7 x 3.4 cm compatible with developing perianal abscess. Infiltrative changes and fluid extend inferiorly in the LEFT medial buttock from this collection. Stomach decompressed. Remaining bowel loops normal appearance. Mild edema within the LEFT buttock. Vascular/Lymphatic: Mild atherosclerotic calcification aorta. Aorta normal caliber. Multiple normal sized inguinal nodes slightly greater on LEFT. Reproductive: Post hysterectomy. Retained cervix versus prominent vaginal cuff LEFT of midline. Ovaries not seen. Other: No free air or free fluid.  No hernia. Musculoskeletal: Osseous structures unremarkable. IMPRESSION: Anorectal wall thickening and surrounding infiltrative changes compatible with infection with evidence of a poorly defined perianal fluid collection dorsal to the anus measuring 2.7 x 3.6 x 3.4 cm consistent with a developing perianal abscess extending into the medial LEFT buttock and tracking slightly inferiorly. No intrapelvic extension of this inflammatory process is identified. Electronically Signed   By: Lavonia Dana M.D.   On: 09/26/2015 16:32    Review of Systems  Constitutional: Positive for fever. Negative for chills.  HENT: Negative for hearing loss.   Eyes: Negative for blurred vision and double vision.  Respiratory: Negative for cough and hemoptysis.   Cardiovascular: Negative for chest pain and palpitations.  Gastrointestinal: Negative for abdominal pain, nausea and vomiting.  Genitourinary: Negative for dysuria and urgency.  Musculoskeletal: Negative for myalgias  and neck pain.  Skin: Negative for itching and rash.  Neurological: Negative for dizziness, tingling and headaches.  Endo/Heme/Allergies: Does not bruise/bleed easily.  Psychiatric/Behavioral: Negative for depression and suicidal ideas.    Blood pressure 144/75, pulse 96, temperature 98.9 F (37.2 C), temperature source Oral, resp. rate 22, height 5' (1.524 m), weight 68.5 kg (151 lb), SpO2 99 %. Physical Exam  Vitals reviewed. Constitutional: She is oriented to person, place, and time. She appears well-developed and well-nourished.  HENT:  Head: Normocephalic and atraumatic.  Eyes: Conjunctivae and EOM are normal. Pupils are equal, round, and reactive to light.  Neck: Normal range of motion. Neck supple.  Cardiovascular: Normal rate and regular rhythm.   Respiratory: Effort normal and breath sounds normal.  GI: Soft. Bowel sounds are normal. She exhibits no distension. There is no tenderness.  Genitourinary:  Genitourinary Comments: Palpable fluctuance area posterior to anus. No active drainage. Area approximately 2 cm posterior to the anal verge  Musculoskeletal: Normal range of motion.  Neurological: She is alert and oriented to person, place, and time.  Skin: Skin is warm and dry.  Psychiatric: She has a normal mood and affect. Her behavior is normal.     Assessment/Plan 70 year old female with moderately controlled diabetes mellitus who has first area rectal abscess.  Mickeal Skinner, MD 09/26/2015, 5:38 PM

## 2015-09-26 NOTE — Transfer of Care (Signed)
Immediate Anesthesia Transfer of Care Note  Patient: Sarah [name]  Procedure(s) Performed: Procedure(s): IRRIGATION AND DEBRIDEMENT PERIRECTAL ABSCESS (N/A)  Patient Location: PACU  Anesthesia Type:General  Level of Consciousness: awake, alert  and oriented  Airway & Oxygen Therapy: Patient Spontanous Breathing  Post-op Assessment: Report given to RN and Post -op Vital signs reviewed and stable  Post vital signs: Reviewed and stable  Last Vitals:  Vitals:   09/26/15 1839 09/26/15 2015  BP:  (!) 142/70  Pulse:  100  Resp:  17  Temp: 37.8 C 36.6 C    Last Pain:  Vitals:   09/26/15 1839  TempSrc: Oral  PainSc:          Complications: No apparent anesthesia complications

## 2015-09-26 NOTE — ED Provider Notes (Signed)
Care assumed from [facility], PA-C at end of shift. In brief Sarah [name] is an 70 y.o. female with history of T2DM, HTN, HLD, adenomatous colonic polyps, recurrent skin abscesses who presents to the ED for evaluation of possible perianal/perirectal abscess. Pt has had rectal pain for the past two to three days with associated fever, chills, and body aches. She was seen by PCP yesterday and could not tolerate DRE but was noted to have some erythema of the L gluteal region, given a shot of IM rocephin and blood work sent. Outpatient CBC returned with a leukocytosis of 18,900 so she was told to come to the ER by her PCP.   In the ED she is found to have digital rectal exam concerning for perianal abscess. She has a white count of 20.1 here. No fever, tachycardia, or hypotension. Her pain is well controlled. At the time of sign out CT abd/pelvis is pending for further characterization of suspected abscess. Disposition is pending CT scan and general surgery consult. Likely will be a hospital admission either way. She is on a NS infusion and NPO. Last food 8PM last night, few sips OJ this AM.   Physical Exam  BP (!) 126/53   Pulse 98   Temp 98.9 F (37.2 C) (Oral)   Resp 18   Ht 5' (1.524 m)   Wt 68.5 kg   SpO2 95%   BMI 29.49 kg/m   Physical Exam  Constitutional: She is oriented to person, place, and time. No distress.  HENT:  Head: Atraumatic.  Right Ear: External ear normal.  Left Ear: External ear normal.  Nose: Nose normal.  Eyes: Conjunctivae are normal. No scleral icterus.  Cardiovascular: Normal rate.   Pulmonary/Chest: Effort normal. No respiratory distress.  Abdominal: She exhibits no distension.  Neurological: She is alert and oriented to person, place, and time.  Skin: Skin is warm and dry. She is not diaphoretic.  Psychiatric: She has a normal mood and affect. Her behavior is normal.  Nursing note and vitals reviewed.   ED Course  Procedures Results for  orders placed or performed during the hospital encounter of 09/26/15  Culture, blood (Routine X 2) w Reflex to ID Panel  Result Value Ref Range   Specimen Description BLOOD RIGHT ANTECUBITAL    Special Requests BOTTLES DRAWN AEROBIC AND ANAEROBIC 5CC    Culture NO GROWTH <12 HOURS    Report Status PENDING   Culture, blood (Routine X 2) w Reflex to ID Panel  Result Value Ref Range   Specimen Description BLOOD LEFT ANTECUBITAL    Special Requests BOTTLES DRAWN AEROBIC AND ANAEROBIC 5CC    Culture NO GROWTH <12 HOURS    Report Status PENDING   CBC with Differential  Result Value Ref Range   WBC 20.1 (H) 4.0 - 10.5 K/uL   RBC 4.02 3.87 - 5.11 MIL/uL   Hemoglobin 12.5 12.0 - 15.0 g/dL   HCT 38.1 36.0 - 46.0 %   MCV 94.8 78.0 - 100.0 fL   MCH 31.1 26.0 - 34.0 pg   MCHC 32.8 30.0 - 36.0 g/dL   RDW 12.6 11.5 - 15.5 %   Platelets 333 150 - 400 K/uL   Neutrophils Relative % 83 %   Neutro Abs 16.5 (H) 1.7 - 7.7 K/uL   Lymphocytes Relative 11 %   Lymphs Abs 2.3 0.7 - 4.0 K/uL   Monocytes Relative 6 %   Monocytes Absolute 1.3 (H) 0.1 - 1.0 K/uL   Eosinophils  Relative 0 %   Eosinophils Absolute 0.0 0.0 - 0.7 K/uL   Basophils Relative 0 %   Basophils Absolute 0.0 0.0 - 0.1 K/uL  Basic metabolic panel  Result Value Ref Range   Sodium 138 135 - 145 mmol/L   Potassium 3.9 3.5 - 5.1 mmol/L   Chloride 104 101 - 111 mmol/L   CO2 24 22 - 32 mmol/L   Glucose, Bld 234 (H) 65 - 99 mg/dL   BUN 15 6 - 20 mg/dL   Creatinine, Ser 0.65 0.44 - 1.00 mg/dL   Calcium 9.8 8.9 - 10.3 mg/dL   GFR calc non Af Amer >60 >60 mL/min   GFR calc Af Amer >60 >60 mL/min   Anion gap 10 5 - 15  Urinalysis, Routine w reflex microscopic (not at [facility])  Result Value Ref Range   Color, Urine AMBER (A) YELLOW   APPearance CLOUDY (A) CLEAR   Specific Gravity, Urine 1.031 (H) 1.005 - 1.030   pH 5.5 5.0 - 8.0   Glucose, UA 250 (A) NEGATIVE mg/dL   Hgb urine dipstick NEGATIVE NEGATIVE   Bilirubin Urine SMALL (A)  NEGATIVE   Ketones, ur NEGATIVE NEGATIVE mg/dL   Protein, ur 100 (A) NEGATIVE mg/dL   Nitrite NEGATIVE NEGATIVE   Leukocytes, UA NEGATIVE NEGATIVE  Urine microscopic-add on  Result Value Ref Range   Squamous Epithelial / LPF 6-30 (A) NONE SEEN   WBC, UA 0-5 0 - 5 WBC/hpf   RBC / HPF 0-5 0 - 5 RBC/hpf   Bacteria, UA RARE (A) NONE SEEN   Urine-Other YEAST PRESENT   I-Stat CG4 Lactic Acid, ED  Result Value Ref Range   Lactic Acid, Venous 0.99 0.5 - 1.9 mmol/L  POC occult blood, ED  Result Value Ref Range   Fecal Occult Bld NEGATIVE NEGATIVE   Ct Abdomen Pelvis W Contrast  Result Date: 09/26/2015 CLINICAL DATA:  BILATERAL lower quadrant abdominal pain for 4 days, history hemorrhoids, hypertension, diabetes mellitus, smoking EXAM: CT ABDOMEN AND PELVIS WITH CONTRAST TECHNIQUE: Multidetector CT imaging of the abdomen and pelvis was performed using the standard protocol following bolus administration of intravenous contrast. Sagittal and coronal MPR images reconstructed from axial data set. CONTRAST:  172mL ISOVUE-300 IOPAMIDOL (ISOVUE-300) INJECTION 61% IV. No oral contrast administered. COMPARISON:  09/24/2011 FINDINGS: Lower chest: Lung bases clear. Hepatobiliary: Gallbladder and liver normal appearance Pancreas: Normal appearance Spleen: Normal appearance Adrenals/Urinary Tract: BILATERAL mild thickening of adrenal glands without discrete mass. Kidneys, ureters, and bladder normal appearance Stomach/Bowel: Normal appendix. Bowel wall thickening of rectum/anus with perirectal and perianal soft tissue infiltration compatible with infection. Poorly defined fluid collection dorsal to the rectum and anus, 3.6 x 2.7 x 3.4 cm compatible with developing perianal abscess. Infiltrative changes and fluid extend inferiorly in the LEFT medial buttock from this collection. Stomach decompressed. Remaining bowel loops normal appearance. Mild edema within the LEFT buttock. Vascular/Lymphatic: Mild atherosclerotic  calcification aorta. Aorta normal caliber. Multiple normal sized inguinal nodes slightly greater on LEFT. Reproductive: Post hysterectomy. Retained cervix versus prominent vaginal cuff LEFT of midline. Ovaries not seen. Other: No free air or free fluid.  No hernia. Musculoskeletal: Osseous structures unremarkable. IMPRESSION: Anorectal wall thickening and surrounding infiltrative changes compatible with infection with evidence of a poorly defined perianal fluid collection dorsal to the anus measuring 2.7 x 3.6 x 3.4 cm consistent with a developing perianal abscess extending into the medial LEFT buttock and tracking slightly inferiorly. No intrapelvic extension of this inflammatory process is identified. Electronically  Signed   By: Lavonia Dana M.D.   On: 09/26/2015 16:32     MDM 4:56 PM CT abd/pelvis reveals anorectal wall thickening/infiltrative changes compatible with infection. There is an associated poorly defined perianal fluid collection consistent with developing abscess. IV Zosyn ordered. I spoke with Dr. Excell Seltzer of general surgery; Dr. Reece Agar is about to come on shift and will come see pt in the ED.    General surgery to take to OR and admit.    Anne Ng, PA-C 09/26/15 1846    Charlesetta Shanks, MD 09/30/15 [phone]

## 2015-09-26 NOTE — Anesthesia Procedure Notes (Signed)
Procedure Name: LMA Insertion Date/Time: 09/26/2015 7:35 PM Performed by: Manuela Schwartz B Pre-anesthesia Checklist: Patient identified, Emergency Drugs available, Suction available, Patient being monitored and Timeout performed Patient Re-evaluated:Patient Re-evaluated prior to inductionOxygen Delivery Method: Circle system utilized Preoxygenation: Pre-oxygenation with 100% oxygen Intubation Type: IV induction LMA: LMA inserted LMA Size: 4.0 Tube size: 4.0 mm Number of attempts: 1 Placement Confirmation: positive ETCO2 and breath sounds checked- equal and bilateral Tube secured with: Tape Dental Injury: Teeth and Oropharynx as per pre-operative assessment

## 2015-09-26 NOTE — Anesthesia Postprocedure Evaluation (Signed)
Anesthesia Post Note  Patient: Sarah [name]  Procedure(s) Performed: Procedure(s) (LRB): IRRIGATION AND DEBRIDEMENT PERIRECTAL ABSCESS (N/A)  Patient location during evaluation: PACU Anesthesia Type: General Level of consciousness: awake and alert Pain management: pain level controlled Vital Signs Assessment: post-procedure vital signs reviewed and stable Respiratory status: spontaneous breathing, nonlabored ventilation, respiratory function stable and patient connected to nasal cannula oxygen Cardiovascular status: blood pressure returned to baseline and stable Postop Assessment: no signs of nausea or vomiting Anesthetic complications: no    Last Vitals:  Vitals:   09/26/15 2100 09/26/15 2235  BP: (!) 119/56 (!) 106/43  Pulse: 90 86  Resp: (!) 23 18  Temp: 36.1 C 37 C    Last Pain:  Vitals:   09/26/15 2235  TempSrc: Oral  PainSc:                  Catalina Gravel

## 2015-09-26 NOTE — ED Provider Notes (Signed)
[facility] DEPT Provider Note   CSN: [id number] Arrival date & time: 09/26/15  1205     History   Chief Complaint Chief Complaint  Patient presents with  . Wound Check    HPI Sarah [name] is a 70 y.o. female with a PMHx of adenomatous colonic polyps, HLD, osteoporosis, DM2, glaucoma, heart murmur, abscesses, HTN, anemia, and vit D deficiency, who presents to the ED with complaints of rectal pain sent by her PCP for concerning lab work. Chart review reveals that she went to her PCP yesterday for "hemorrhoids" x 2 days, rectal pain, a low-grade fever of 101.2 on Wednesday, chills, and body aches. DRE was done at the office, and hardly tolerated internal portion of exam, but was noted to have some erythema of an external hemorrhoid and some induration/warmth just inferior to that region on the L gluteal region, concerning for perianal abscess. Ambulatory referral to surgery done but couldn't get appt until next Thursday, she was given rocephin IM and started on augmentin and anusol, labs ordered and showed WBC 18.9 so they called her today and told her to come to the ER.   Patient states that it all began approximately 1 week ago when she felt like she had hemorrhoids, but 3 days ago she developed worsening rectal pain, fever Tmax 101.2, chills, body aches, and warmth to the area near her rectum. She describes the pain as 7/10 constant aching nonradiating rectal pain worse with pressure to the area/sitting, and relieved with avoiding pressure to the area and Norco which was prescribed to her yesterday. She reports that she has had some constipation, her last bowel movement was on Tuesday and it was hard causing her to strain, which is why she thought that her rectal pain was due to a hemorrhoid. She has not been able to defecate since then, and fears that it would be very painful to attempt to have a bowel movement. She has not had recurrent fevers since Wednesday. She last took Norco at  [employer] and has not taken any other antipyretics today PTA.   She denies any chest pain, shortness breath, abdominal pain, nausea, vomiting, diarrhea, melena, hematochezia, obstipation, dysuria, hematuria, numbness, tingling, focal weakness, red streaking, or rashes. She is unable to tell whether the area is red, because she has not looked. Her PCP is at [facility], Dr. [name]. Last ate last night, salmon dinner at 8pm; last drank a few sips of OJ at around 9am, did not eat anything today.    The history is provided by the patient and medical records. No language interpreter was used.  Abscess  Location:  Pelvis Pelvic abscess location:  Anus and perianal Abscess quality: painful and warmth   Red streaking: no   Duration:  3 days Progression:  Worsening Pain details:    Quality:  Aching   Severity:  Moderate   Duration:  3 days   Timing:  Constant   Progression:  Worsening Chronicity:  New Context: diabetes   Relieved by: avoiding pressure to the area, and norco. Exacerbated by: pressure to area/sitting. Ineffective treatments:  None tried Associated symptoms: fever (101.2)   Associated symptoms: no nausea and no vomiting   Risk factors: prior abscess     Past Medical History:  Diagnosis Date  . Boils   . Glaucoma   . Heart murmur   . HTN (hypertension)   . Hx of adenomatous colonic polyps   . Hyperlipidemia   . Osteoporosis   . Pneumonia   .  Type II or unspecified type diabetes mellitus without mention of complication, uncontrolled     Patient Active Problem List   Diagnosis Date Noted  . Vitamin D deficiency 07/23/2015  . Osteopenia 02/16/2013  . Ankle fracture 01/19/2013  . Anemia 09/27/2011  . Uncontrolled type 2 diabetes mellitus with insulin therapy ([place]) 09/24/2011  . Hyperlipidemia 09/24/2011  . Hypertension 09/24/2011    Past Surgical History:  Procedure Laterality Date  . ABDOMINAL HYSTERECTOMY    . CARPAL TUNNEL RELEASE     right  . COLONOSCOPY  12-10-10     per Dr. Deatra Ina, clear, repeat in 7 yrs   . KNEE ARTHROSCOPY     right  . ORIF ANKLE FRACTURE Right 03/24/2012   Procedure: OPEN REDUCTION INTERNAL FIXATION (ORIF) ANKLE FRACTURE;  Surgeon: Newt Minion, MD;  Location: [place];  Service: Orthopedics;  Laterality: Right;  Open Reduction Internal Fixation Right Bimalleolar ankle fracture  . POLYPECTOMY    . TONSILLECTOMY      OB History    No data available       Home Medications    Prior to Admission medications   Medication Sig Start Date End Date Taking? Authorizing Provider  amLODipine (NORVASC) 10 MG tablet Take 1 tablet (10 mg total) by mouth daily. 07/23/15   [name] [name], MD  amoxicillin-clavulanate (AUGMENTIN) 875-125 MG tablet Take 1 tablet by mouth 2 (two) times daily. 09/25/15 10/05/15  [name] [name], MD  aspirin EC 81 MG tablet Take 81 mg by mouth daily.    Historical Provider, MD  atorvastatin (LIPITOR) 20 MG tablet Take 1 tablet (20 mg total) by mouth daily. 03/15/14   Doe-Hyun Kyra Searles, DO  HYDROcodone-acetaminophen (NORCO/VICODIN) 5-325 MG tablet Take 1 tablet by mouth every 8 (eight) hours as needed for moderate pain. 09/25/15 10/02/15  [name] [name], MD  hydrocortisone (ANUSOL-HC) 2.5 % rectal cream Place 1 application rectally 2 (two) times daily. 09/25/15 10/02/15  [name] [name], MD  Insulin Glargine (LANTUS) 100 UNIT/ML Solostar Pen Use 50 Units daily at the same time. May titrate up if FG >200. 08/11/15   [name] [name], MD  Insulin Pen Needle 32G X 4 MM MISC Use up to 4x a day 12/10/14   Philemon Kingdom, MD  insulin regular human CONCENTRATED (HUMULIN R U-500 KWIKPEN) 500 UNIT/ML kwikpen Inject 70-85 Units into the skin 3 (three) times daily with meals. 07/23/15   [name] [name], MD  metFORMIN (GLUCOPHAGE) 1000 MG tablet Take 1 tablet (1,000 mg total) by mouth 2 (two) times daily with a meal. Patient taking differently: Take 1,000 mg by mouth 2 (two) times daily with a meal. Only takes once a day 06/05/14   Philemon Kingdom,  MD  TRULICITY [id number] SOPN INJECT 0.5 ML UNDER THE SKIN WEEKLY 09/19/15   [name] [name], MD  valsartan-hydrochlorothiazide (DIOVAN-HCT) 320-25 MG tablet Take 1 tablet by mouth daily. 07/23/15   [name] [name], MD  Vitamin D, Ergocalciferol, (DRISDOL) 50000 units CAPS capsule TAKE 1 CAPSULE BY MOUTH TWICE A WEEK ON MONDAY AND THURSDAY 09/08/15   [name] [name], MD    Family History Family History  Problem Relation Age of Onset  . Diabetes Mother   . Hypertension Mother   . Colon cancer Neg Hx   . Esophageal cancer Neg Hx   . Rectal cancer Neg Hx   . Stomach cancer Neg Hx     Social History Social History  Substance Use Topics  . Smoking status:  Current Some Day Smoker  . Smokeless tobacco: Never Used     Comment: smokes occ.   Marland Kitchen Alcohol use 0.0 oz/week     Comment: occ     Allergies   Review of patient's allergies indicates no known allergies.   Review of Systems Review of Systems  Constitutional: Positive for chills and fever (101.2).  Respiratory: Negative for shortness of breath.   Cardiovascular: Negative for chest pain.  Gastrointestinal: Positive for constipation and rectal pain. Negative for abdominal pain, anal bleeding, blood in stool, diarrhea, nausea and vomiting.  Genitourinary: Negative for dysuria and hematuria.  Musculoskeletal: Positive for myalgias (body aches). Negative for arthralgias.  Skin: Negative for color change.       +warmth to gluteal/rectal region  Allergic/Immunologic: Positive for immunocompromised state (diabetic).  Neurological: Negative for weakness and numbness.  Psychiatric/Behavioral: Negative for confusion.   10 Systems reviewed and are negative for acute change except as noted in the HPI.   Physical Exam Updated Vital Signs BP 126/63 (BP Location: Right Arm)   Pulse 98   Temp 98.9 F (37.2 C) (Oral)   Resp 18   Ht 5' (1.524 m)   Wt 68.5 kg   SpO2 95%   BMI 29.49 kg/m   Physical Exam  Constitutional: She is oriented  to person, place, and time. Vital signs are normal. She appears well-developed and well-nourished.  Non-toxic appearance. No distress.  Afebrile, nontoxic, NAD  HENT:  Head: Normocephalic and atraumatic.  Mouth/Throat: Oropharynx is clear and moist and mucous membranes are normal.  Eyes: Conjunctivae and EOM are normal. Right eye exhibits no discharge. Left eye exhibits no discharge.  Neck: Normal range of motion. Neck supple.  Cardiovascular: Normal rate, regular rhythm, normal heart sounds and intact distal pulses.  Exam reveals no gallop and no friction rub.   Pulmonary/Chest: Effort normal and breath sounds normal. No respiratory distress. She has no decreased breath sounds. She has no wheezes. She has no rhonchi. She has no rales.  Abdominal: Soft. Normal appearance and bowel sounds are normal. She exhibits no distension. There is no tenderness. There is no rigidity, no rebound, no guarding, no CVA tenderness, no tenderness at McBurney's point and negative Murphy's sign.  Soft, NTND, +BS throughout, no r/g/r, neg murphy's, neg mcburney's, no CVA TTP  Genitourinary: Rectal exam shows external hemorrhoid, internal hemorrhoid, mass (abscess to L gluteal/perirectal region) and tenderness. Rectal exam shows no fissure, anal tone normal and guaiac negative stool.    Pelvic exam was performed with patient in the knee-chest position.  Genitourinary Comments: Chaperone present No gross blood noted on rectal exam, normal tone, no fissures; +large ext hemorrhoid which is flaccid and appears like an old hemorrhoidal skin tag vs improving hemorrhoid, not thrombosed, and actually not tender, easily reduces; exquisite tenderness to an indurated L gluteal/perirectal abscess measuring ~6cm in length which is erythematous and warm to touch, and margin of induration palpable on internal rectal exam, going to a depth of ~3cm, no definite fluctuance felt. Some internal hemorrhoids palpable and nontender. FOBT neg.    Musculoskeletal: Normal range of motion.  Neurological: She is alert and oriented to person, place, and time. She has normal strength. No sensory deficit.  Skin: Skin is warm, dry and intact. No rash noted. There is erythema.  L gluteal erythema/warmth as mentioned above  Psychiatric: She has a normal mood and affect.  Nursing note and vitals reviewed.    ED Treatments / Results  Labs (all labs ordered are  listed, but only abnormal results are displayed) Labs Reviewed  CBC WITH DIFFERENTIAL/PLATELET - Abnormal; Notable for the following:       Result Value   WBC 20.1 (*)    Neutro Abs 16.5 (*)    Monocytes Absolute 1.3 (*)    All other components within normal limits  BASIC METABOLIC PANEL - Abnormal; Notable for the following:    Glucose, Bld 234 (*)    All other components within normal limits  URINALYSIS, ROUTINE W REFLEX MICROSCOPIC (NOT AT [facility]) - Abnormal; Notable for the following:    Color, Urine AMBER (*)    APPearance CLOUDY (*)    Specific Gravity, Urine 1.031 (*)    Glucose, UA 250 (*)    Bilirubin Urine SMALL (*)    Protein, ur 100 (*)    All other components within normal limits  URINE MICROSCOPIC-ADD ON - Abnormal; Notable for the following:    Squamous Epithelial / LPF 6-30 (*)    Bacteria, UA RARE (*)    All other components within normal limits  URINE CULTURE  CULTURE, BLOOD (ROUTINE X 2)  CULTURE, BLOOD (ROUTINE X 2)  I-STAT CG4 LACTIC ACID, ED  POC OCCULT BLOOD, ED    Results for ONETA, DINNEEN (MRN [id number]) as of 09/26/2015 12:49  Ref. Range 09/25/2015 16:12  WBC Latest Ref Range: 4.0 - 10.5 K/uL 18.9 Repeated and... (HH)  RBC Latest Ref Range: 3.87 - 5.11 Mil/uL 4.03  Hemoglobin Latest Ref Range: 12.0 - 15.0 g/dL 12.5  HCT Latest Ref Range: 36.0 - 46.0 % 37.5  MCV Latest Ref Range: 78.0 - 100.0 fl 93.2  MCHC Latest Ref Range: 30.0 - 36.0 g/dL 33.3  RDW Latest Ref Range: 11.5 - 15.5 % 13.3  Platelets Latest Ref Range: 150.0 - 400.0 K/uL  327.0  Neutrophils Latest Ref Range: 43.0 - 77.0 % 80.5 (H)  Lymphocytes Latest Ref Range: 12.0 - 46.0 % 12.8  Monocytes Relative Latest Ref Range: 3.0 - 12.0 % 6.5  Eosinophil Latest Ref Range: 0.0 - 5.0 % 0.2  Basophil Latest Ref Range: 0.0 - 3.0 % 0.0  NEUT# Latest Ref Range: 1.4 - 7.7 K/uL 15.2 (H)  Lymphocyte # Latest Ref Range: 0.7 - 4.0 K/uL 2.4  Monocyte # Latest Ref Range: 0.1 - 1.0 K/uL 1.2 (H)  Eosinophils Absolute Latest Ref Range: 0.0 - 0.7 K/uL 0.0  Basophils Absolute Latest Ref Range: 0.0 - 0.1 K/uL 0.0    EKG  EKG Interpretation None       Radiology No results found.  Procedures Procedures (including critical care time)  Medications Ordered in ED Medications  0.9 %  sodium chloride infusion (1,000 mLs Intravenous New Bag/Given 09/26/15 1355)  iopamidol (ISOVUE-300) 61 % injection (not administered)  morphine 4 MG/ML injection 4 mg (4 mg Intravenous Given 09/26/15 1352)     Initial Impression / Assessment and Plan / ED Course  I have reviewed the triage vital signs and the nursing notes.  Pertinent labs & imaging results that were available during my care of the patient were reviewed by me and considered in my medical decision making (see chart for details).  Clinical Course    70 y.o. female here with 1wk of hemorrhoids with rectal pain gradually worsening x2-3 days, some chills and fever 101.2 at home 2 days ago, saw PCP yesterday, diagnosed with hemorrhoid and ?perianal abscess, given rocephin IM and started on augmentin, tried to get surgery referral but couldn't get appt until next Thursday, they  ordered a CBC w/diff there which showed WBC 0000000 with neutrophilic predominance so they called her today and told her to come to the ED. On exam, large external hemorrhoid which is essentially flaccid and looks more like an old hemorrhoidal skin tag or improving hemorrhoid, not thrombosed and not actually tender, but large ~6cm in length indurated region of the L  gluteal region adjacent to the anal opening exquisitely tender and warm to touch, erythematous, without definite fluctuance. Internal hemorrhoids palpable, and indurated edge of L gluteal abscess margin palpable during rectal exam at least 3cm internally. Area consistent with perianal abscess/cellulitis. Afebrile here, no abdominal tenderness, appears well, no tachycardia. Does not meet sepsis/SIRS criteria, but will get CBC, BMP, lactic, U/A, UCx, BCx, FOBT, and CT abd/pelv to evaluate the extent of the abscess/cellulitis. Will hold off on abx until decision on surgical intervention is decided upon, in order to avoid issues with wound culture results. Will give fluids, but not weight based fluids unless lactic elevated or she develops fever. Will give pain meds and reassess shortly. Discussed case with my attending Dr. Laverta Baltimore who agrees with plan.   3:09 PM FOBT neg. CBC w/diff with leukocytosis 20.1 up from 18.9 yesterday, again with neutrophilic predominance. Lactic 0.99 which is reassuring, doubt need for weight based fluid resuscitation at this time. BMP with hyperglycemia glucose 234 without anion gap or other acute findings. U/A with neg nitrites and leuks, 6-30 squamous so fairly contaminated, 0-5 WBC and RBCs, rare bacteria, and yeast present-- may need to consider giving diflucan at some point, but will hold off for now since she's NPO. CT abd/pelv not yet done, will reassess once that's been done. Pt states pain is greatly improved, feels better, has no questions/concerns/needs at this time. Will monitor and reassess after CT.   3:40 PM CT abd/pelv not yet done. Patient care to be assumed by Delrae Rend, PA-C at shift change sign-out. Patient history has been discussed with midlevel resuming care. Please see their notes for further documentation of pending results and dispo/care-- plan is likely obtain surgery consult depending on CT results, and likely admit. Pt stable at sign-out and updated on  transfer of care.   Final Clinical Impressions(s) / ED Diagnoses   Final diagnoses:  Perirectal abscess  Neutrophilic leukocytosis  Hemorrhoids, unspecified hemorrhoid type  Constipation, unspecified constipation type  Type 2 diabetes mellitus with hyperglycemia, with long-term current use of insulin (HCC)  Vaginal yeast infection    New Prescriptions New Prescriptions   No medications on file     Zacarias Pontes, PA-C 09/26/15 1541    Margette Fast, MD 09/26/15 2009

## 2015-09-26 NOTE — Telephone Encounter (Signed)
Received call from the lab from [facility] on critical lab value. Ms. Whitelaw's WBC is 18.9

## 2015-09-27 LAB — CBC
HCT: 33.4 % — ABNORMAL LOW (ref 36.0–46.0)
Hemoglobin: 10.6 g/dL — ABNORMAL LOW (ref 12.0–15.0)
MCH: 30.5 pg (ref 26.0–34.0)
MCHC: 31.7 g/dL (ref 30.0–36.0)
MCV: 96 fL (ref 78.0–100.0)
Platelets: 307 10*3/uL (ref 150–400)
RBC: 3.48 MIL/uL — ABNORMAL LOW (ref 3.87–5.11)
RDW: 12.8 % (ref 11.5–15.5)
WBC: 19.3 10*3/uL — ABNORMAL HIGH (ref 4.0–10.5)

## 2015-09-27 LAB — BASIC METABOLIC PANEL
Anion gap: 5 (ref 5–15)
BUN: 15 mg/dL (ref 6–20)
CO2: 25 mmol/L (ref 22–32)
Calcium: 8.6 mg/dL — ABNORMAL LOW (ref 8.9–10.3)
Chloride: 109 mmol/L (ref 101–111)
Creatinine, Ser: 0.67 mg/dL (ref 0.44–1.00)
GFR calc Af Amer: >60 mL/min (ref >60)
GFR calc non Af Amer: >60 mL/min (ref >60)
Glucose, Bld: 212 mg/dL — ABNORMAL HIGH (ref 65–99)
Potassium: 4.7 mmol/L (ref 3.5–5.1)
Sodium: 139 mmol/L (ref 135–145)

## 2015-09-27 LAB — URINE CULTURE: Culture: NO GROWTH

## 2015-09-27 LAB — GLUCOSE, CAPILLARY
Glucose-Capillary: 215 mg/dL — ABNORMAL HIGH (ref 65–99)
Glucose-Capillary: 111 mg/dL — ABNORMAL HIGH (ref 65–99)
Glucose-Capillary: 233 mg/dL — ABNORMAL HIGH (ref 65–99)
Glucose-Capillary: 103 mg/dL — ABNORMAL HIGH (ref 65–99)

## 2015-09-27 MED ORDER — DOCUSATE SODIUM 100 MG PO CAPS
100.0000 mg | ORAL_CAPSULE | Freq: Two times a day (BID) | ORAL | Status: DC
  Administered 2015-09-27 – 2015-09-29 (×5): 100 mg via ORAL
  Filled 2015-09-27 (×5): qty 1

## 2015-09-27 MED ORDER — SODIUM CHLORIDE 0.9 % IV SOLN
1000.0000 mL | INTRAVENOUS | Status: DC
  Administered 2015-09-27 – 2015-09-28 (×2): 1000 mL via INTRAVENOUS

## 2015-09-27 NOTE — Progress Notes (Signed)
1 Day Post-Op  Subjective: Feels ok, voiding, tol diet  Objective: Vital signs in last 24 hours: Temp:  [97 F (36.1 C)-101.3 F (38.5 C)] 100.4 F (38 C) (09/16 0916) Pulse Rate:  [85-103] 85 (09/16 0916) Resp:  [17-23] 18 (09/16 0300) BP: (83-154)/(43-78) 95/50 (09/16 0916) SpO2:  [91 %-99 %] 95 % (09/16 0916) Weight:  [68.5 kg (151 lb)] 68.5 kg (151 lb) (09/15 1216)    Intake/Output from previous day: 09/15 0701 - 09/16 0700 In: 750 [I.V.:700; IV Piggyback:50] Out: 200 [Urine:200] Intake/Output this shift: No intake/output data recorded.  GI: soft nt nd, wound draining with penrose in place, mild cellulitis  Lab Results:   Recent Labs  09/26/15 1330 09/27/15 0525  WBC 20.1* 19.3*  HGB 12.5 10.6*  HCT 38.1 33.4*  PLT 333 307   BMET  Recent Labs  09/26/15 1330 09/27/15 0525  NA 138 139  K 3.9 4.7  CL 104 109  CO2 24 25  GLUCOSE 234* 212*  BUN 15 15  CREATININE 0.65 0.67  CALCIUM 9.8 8.6*   PT/INR No results for input(s): LABPROT, INR in the last 72 hours. ABG No results for input(s): PHART, HCO3 in the last 72 hours.  Invalid input(s): PCO2, PO2  Studies/Results: Ct Abdomen Pelvis W Contrast  Result Date: 09/26/2015 CLINICAL DATA:  BILATERAL lower quadrant abdominal pain for 4 days, history hemorrhoids, hypertension, diabetes mellitus, smoking EXAM: CT ABDOMEN AND PELVIS WITH CONTRAST TECHNIQUE: Multidetector CT imaging of the abdomen and pelvis was performed using the standard protocol following bolus administration of intravenous contrast. Sagittal and coronal MPR images reconstructed from axial data set. CONTRAST:  140mL ISOVUE-300 IOPAMIDOL (ISOVUE-300) INJECTION 61% IV. No oral contrast administered. COMPARISON:  09/24/2011 FINDINGS: Lower chest: Lung bases clear. Hepatobiliary: Gallbladder and liver normal appearance Pancreas: Normal appearance Spleen: Normal appearance Adrenals/Urinary Tract: BILATERAL mild thickening of adrenal glands without  discrete mass. Kidneys, ureters, and bladder normal appearance Stomach/Bowel: Normal appendix. Bowel wall thickening of rectum/anus with perirectal and perianal soft tissue infiltration compatible with infection. Poorly defined fluid collection dorsal to the rectum and anus, 3.6 x 2.7 x 3.4 cm compatible with developing perianal abscess. Infiltrative changes and fluid extend inferiorly in the LEFT medial buttock from this collection. Stomach decompressed. Remaining bowel loops normal appearance. Mild edema within the LEFT buttock. Vascular/Lymphatic: Mild atherosclerotic calcification aorta. Aorta normal caliber. Multiple normal sized inguinal nodes slightly greater on LEFT. Reproductive: Post hysterectomy. Retained cervix versus prominent vaginal cuff LEFT of midline. Ovaries not seen. Other: No free air or free fluid.  No hernia. Musculoskeletal: Osseous structures unremarkable. IMPRESSION: Anorectal wall thickening and surrounding infiltrative changes compatible with infection with evidence of a poorly defined perianal fluid collection dorsal to the anus measuring 2.7 x 3.6 x 3.4 cm consistent with a developing perianal abscess extending into the medial LEFT buttock and tracking slightly inferiorly. No intrapelvic extension of this inflammatory process is identified. Electronically Signed   By: Lavonia Dana M.D.   On: 09/26/2015 16:32    Anti-infectives: Anti-infectives    Start     Dose/Rate Route Frequency Ordered Stop   09/26/15 2300  piperacillin-tazobactam (ZOSYN) IVPB 3.375 g     3.375 g 12.5 mL/hr over 240 Minutes Intravenous Every 8 hours 09/26/15 1644     09/26/15 1730  vancomycin (VANCOCIN) IVPB 1000 mg/200 mL premix  Status:  Discontinued     1,000 mg 200 mL/hr over 60 Minutes Intravenous  Once 09/26/15 1708 09/26/15 1716   09/26/15 1730  vancomycin (VANCOCIN) 1,250 mg in sodium chloride 0.9 % 250 mL IVPB     1,250 mg 166.7 mL/hr over 90 Minutes Intravenous Every 24 hours 09/26/15 1716      09/26/15 1700  piperacillin-tazobactam (ZOSYN) IVPB 3.375 g     3.375 g 100 mL/hr over 30 Minutes Intravenous  Once 09/26/15 1643 09/26/15 1814      Assessment/Plan: POD 1 I/d perirectal abscess  Continue iv abx for 24 more hours due to cellulitis and wbc Regular diet, ssi Lovenox, scds    [facility] 09/27/2015

## 2015-09-28 LAB — CBC
HCT: 29.4 % — ABNORMAL LOW (ref 36.0–46.0)
Hemoglobin: 9.3 g/dL — ABNORMAL LOW (ref 12.0–15.0)
MCH: 30.4 pg (ref 26.0–34.0)
MCHC: 31.6 g/dL (ref 30.0–36.0)
MCV: 96.1 fL (ref 78.0–100.0)
Platelets: 287 10*3/uL (ref 150–400)
RBC: 3.06 MIL/uL — ABNORMAL LOW (ref 3.87–5.11)
RDW: 12.7 % (ref 11.5–15.5)
WBC: 14.7 10*3/uL — ABNORMAL HIGH (ref 4.0–10.5)

## 2015-09-28 LAB — GLUCOSE, CAPILLARY
Glucose-Capillary: 71 mg/dL (ref 65–99)
Glucose-Capillary: 142 mg/dL — ABNORMAL HIGH (ref 65–99)
Glucose-Capillary: 236 mg/dL — ABNORMAL HIGH (ref 65–99)
Glucose-Capillary: 121 mg/dL — ABNORMAL HIGH (ref 65–99)

## 2015-09-28 NOTE — Progress Notes (Signed)
2 Days Post-Op  Subjective: Still has drainage coming from the wound.  I will get her in a Sitz bath and we can work on removing the gauze.  WBC is still up.    Objective: Vital signs in last 24 hours: Temp:  [98.8 F (37.1 C)-99.5 F (37.5 C)] 98.8 F (37.1 C) (09/17 0509) Pulse Rate:  [74-93] 74 (09/17 0509) Resp:  [18-20] 18 (09/17 0509) BP: (96-131)/(43-66) 113/57 (09/17 0509) SpO2:  [95 %] 95 % (09/17 0509) Last BM Date: 09/25/15 480 PO 600 IV Afebrile, BP is low WBC 14.7K    Intake/Output from previous day: 09/16 0701 - 09/17 0700 In: 1277.5 [P.O.:480; I.V.:597.5; IV Piggyback:200] Out: -  Intake/Output this shift: No intake/output data recorded.  General appearance: alert, cooperative and no distress Skin: drainage from abscess persist, penrose in place, gauze to tender to remove currently.    Lab Results:   Recent Labs  09/27/15 0525 09/28/15 0146  WBC 19.3* 14.7*  HGB 10.6* 9.3*  HCT 33.4* 29.4*  PLT 307 287    BMET  Recent Labs  09/26/15 1330 09/27/15 0525  NA 138 139  K 3.9 4.7  CL 104 109  CO2 24 25  GLUCOSE 234* 212*  BUN 15 15  CREATININE 0.65 0.67  CALCIUM 9.8 8.6*   PT/INR No results for input(s): LABPROT, INR in the last 72 hours.  No results for input(s): AST, ALT, ALKPHOS, BILITOT, PROT, ALBUMIN in the last 168 hours.   Lipase     Component Value Date/Time   LIPASE 23 09/23/2011 2144     Studies/Results: Ct Abdomen Pelvis W Contrast  Result Date: 09/26/2015 CLINICAL DATA:  BILATERAL lower quadrant abdominal pain for 4 days, history hemorrhoids, hypertension, diabetes mellitus, smoking EXAM: CT ABDOMEN AND PELVIS WITH CONTRAST TECHNIQUE: Multidetector CT imaging of the abdomen and pelvis was performed using the standard protocol following bolus administration of intravenous contrast. Sagittal and coronal MPR images reconstructed from axial data set. CONTRAST:  132mL ISOVUE-300 IOPAMIDOL (ISOVUE-300) INJECTION 61% IV. No oral  contrast administered. COMPARISON:  09/24/2011 FINDINGS: Lower chest: Lung bases clear. Hepatobiliary: Gallbladder and liver normal appearance Pancreas: Normal appearance Spleen: Normal appearance Adrenals/Urinary Tract: BILATERAL mild thickening of adrenal glands without discrete mass. Kidneys, ureters, and bladder normal appearance Stomach/Bowel: Normal appendix. Bowel wall thickening of rectum/anus with perirectal and perianal soft tissue infiltration compatible with infection. Poorly defined fluid collection dorsal to the rectum and anus, 3.6 x 2.7 x 3.4 cm compatible with developing perianal abscess. Infiltrative changes and fluid extend inferiorly in the LEFT medial buttock from this collection. Stomach decompressed. Remaining bowel loops normal appearance. Mild edema within the LEFT buttock. Vascular/Lymphatic: Mild atherosclerotic calcification aorta. Aorta normal caliber. Multiple normal sized inguinal nodes slightly greater on LEFT. Reproductive: Post hysterectomy. Retained cervix versus prominent vaginal cuff LEFT of midline. Ovaries not seen. Other: No free air or free fluid.  No hernia. Musculoskeletal: Osseous structures unremarkable. IMPRESSION: Anorectal wall thickening and surrounding infiltrative changes compatible with infection with evidence of a poorly defined perianal fluid collection dorsal to the anus measuring 2.7 x 3.6 x 3.4 cm consistent with a developing perianal abscess extending into the medial LEFT buttock and tracking slightly inferiorly. No intrapelvic extension of this inflammatory process is identified. Electronically Signed   By: Lavonia Dana M.D.   On: 09/26/2015 16:32   Prior to Admission medications   Medication Sig Start Date End Date Taking? Authorizing Provider  amLODipine (NORVASC) 10 MG tablet Take 1 tablet (10 mg  total) by mouth daily. 07/23/15  Yes [name] [name], MD  aspirin EC 81 MG tablet Take 81 mg by mouth daily.   Yes Historical Provider, MD  atorvastatin  (LIPITOR) 20 MG tablet Take 1 tablet (20 mg total) by mouth daily. 03/15/14  Yes Doe-Hyun Kyra Searles, DO  HYDROcodone-acetaminophen (NORCO/VICODIN) 5-325 MG tablet Take 1 tablet by mouth every 8 (eight) hours as needed for moderate pain. 09/25/15 10/02/15 Yes [name] [name], MD  hydrocortisone (ANUSOL-HC) 2.5 % rectal cream Place 1 application rectally 2 (two) times daily. 09/25/15 10/02/15 Yes [name] [name], MD  Insulin Glargine (LANTUS) 100 UNIT/ML Solostar Pen Use 50 Units daily at the same time. May titrate up if FG >200. 08/11/15  Yes [name] [name], MD  metFORMIN (GLUCOPHAGE) 1000 MG tablet Take 1 tablet (1,000 mg total) by mouth 2 (two) times daily with a meal. Patient taking differently: Take 1,000 mg by mouth 2 (two) times daily with a meal. Only takes once a day 06/05/14  Yes Philemon Kingdom, MD  TRULICITY [id number] [id number] SOPN INJECT 0.5 ML UNDER THE SKIN WEEKLY 09/19/15  Yes [name] [name], MD  valsartan-hydrochlorothiazide (DIOVAN-HCT) 320-25 MG tablet Take 1 tablet by mouth daily. 07/23/15  Yes [name] [name], MD  Vitamin D, Ergocalciferol, (DRISDOL) 50000 units CAPS capsule TAKE 1 CAPSULE BY MOUTH TWICE A WEEK ON MONDAY AND THURSDAY 09/08/15  Yes [name] [name], MD  amoxicillin-clavulanate (AUGMENTIN) 875-125 MG tablet Take 1 tablet by mouth 2 (two) times daily. Patient not taking: Reported on 09/26/2015 09/25/15 10/05/15  [name] [name], MD  Insulin Pen Needle 32G X 4 MM MISC Use up to 4x a day 12/10/14   Philemon Kingdom, MD  insulin regular human CONCENTRATED (HUMULIN R U-500 KWIKPEN) 500 UNIT/ML kwikpen Inject 70-85 Units into the skin 3 (three) times daily with meals. Patient not taking: Reported on 09/26/2015 07/23/15   [name] [name], MD    Medications: . amLODipine  10 mg Oral Daily  . aspirin EC  81 mg Oral Daily  . atorvastatin  20 mg Oral Daily  . docusate sodium  100 mg Oral BID  . enoxaparin (LOVENOX) injection  40 mg Subcutaneous Q24H  . insulin aspart  0-15 Units Subcutaneous TID WC  .  insulin glargine  50 Units Subcutaneous Q2200  . piperacillin-tazobactam (ZOSYN)  IV  3.375 g Intravenous Q8H  . vancomycin  1,250 mg Intravenous Q24H    Assessment/Plan Perirectal abscess I&D perirectal abscess, 09/26/15, Dr. Kieth Brightly Type II diabetes Hypertension FEN:  Carb Mod ID: Zosyn day 3 DVT:  Lovenox  Plan:  Sitz baths and try to get gauze out.  Leave Penrose, continue antibiotics.  Hold amlodipine for SBP <120.  She has already had dose for today.   Recheck labs in AM.       LOS: 2 days    Liem Copenhaver 09/28/2015 [phone]

## 2015-09-29 ENCOUNTER — Encounter (HOSPITAL_COMMUNITY): Payer: Self-pay | Admitting: General Surgery

## 2015-09-29 LAB — BASIC METABOLIC PANEL
Anion gap: 9 (ref 5–15)
BUN: 8 mg/dL (ref 6–20)
CO2: 22 mmol/L (ref 22–32)
Calcium: 9.1 mg/dL (ref 8.9–10.3)
Chloride: 111 mmol/L (ref 101–111)
Creatinine, Ser: 0.54 mg/dL (ref 0.44–1.00)
GFR calc Af Amer: >60 mL/min (ref >60)
GFR calc non Af Amer: >60 mL/min (ref >60)
Glucose, Bld: 49 mg/dL — ABNORMAL LOW (ref 65–99)
Potassium: 3.4 mmol/L — ABNORMAL LOW (ref 3.5–5.1)
Sodium: 142 mmol/L (ref 135–145)

## 2015-09-29 LAB — CBC
HCT: 33.9 % — ABNORMAL LOW (ref 36.0–46.0)
Hemoglobin: 10.7 g/dL — ABNORMAL LOW (ref 12.0–15.0)
MCH: 30.1 pg (ref 26.0–34.0)
MCHC: 31.6 g/dL (ref 30.0–36.0)
MCV: 95.5 fL (ref 78.0–100.0)
Platelets: 365 10*3/uL (ref 150–400)
RBC: 3.55 MIL/uL — ABNORMAL LOW (ref 3.87–5.11)
RDW: 12.4 % (ref 11.5–15.5)
WBC: 13.6 10*3/uL — ABNORMAL HIGH (ref 4.0–10.5)

## 2015-09-29 LAB — GLUCOSE, CAPILLARY: Glucose-Capillary: 159 mg/dL — ABNORMAL HIGH (ref 65–99)

## 2015-09-29 MED ORDER — AMOXICILLIN-POT CLAVULANATE 875-125 MG PO TABS: 1.0000 | ORAL_TABLET | Freq: Two times a day (BID) | ORAL | 0 refills | Status: AC

## 2015-09-29 MED ORDER — SACCHAROMYCES BOULARDII 250 MG PO CAPS: 250.0000 mg | ORAL_CAPSULE | Freq: Two times a day (BID) | ORAL | 0 refills | Status: DC

## 2015-09-29 MED ORDER — HYDROCODONE-ACETAMINOPHEN 5-325 MG PO TABS: 1.0000 | ORAL_TABLET | Freq: Four times a day (QID) | ORAL | 0 refills | Status: DC | PRN

## 2015-09-29 NOTE — Progress Notes (Signed)
Patient ID: Sarah [name], female   DOB: 24-Feb-1945, 70 y.o.   MRN: [id number]  [facility] Surgery Progress Note  3 Days Post-Op  Subjective: Feeling well this morning. Pain controlled, tolerating diet, doing sitz baths BID. WBC trending down, afebrile.  Objective: Vital signs in last 24 hours: Temp:  [97.9 F (36.6 C)-98.7 F (37.1 C)] 97.9 F (36.6 C) (09/18 0444) Pulse Rate:  [80-88] 87 (09/18 0444) Resp:  [16-18] 16 (09/18 0444) BP: (110-149)/(60-68) 149/61 (09/18 0444) SpO2:  [94 %-97 %] 94 % (09/18 0444) Last BM Date: 09/25/15  Intake/Output from previous day: 09/17 0701 - 09/18 0700 In: 1790 [P.O.:540; I.V.:900; IV Piggyback:350] Out: 300 [Urine:300] Intake/Output this shift: No intake/output data recorded.  PE: Gen:  Alert, NAD, pleasant Abd: Soft, NT/ND Wound: penrose and gauze packing in place, trace bloody drainage, no erythema  Lab Results:   Recent Labs  09/28/15 0146 09/29/15 0403  WBC 14.7* 13.6*  HGB 9.3* 10.7*  HCT 29.4* 33.9*  PLT 287 365   BMET  Recent Labs  09/27/15 0525 09/29/15 0403  NA 139 142  K 4.7 3.4*  CL 109 111  CO2 25 22  GLUCOSE 212* 49*  BUN 15 8  CREATININE 0.67 0.54  CALCIUM 8.6* 9.1   PT/INR No results for input(s): LABPROT, INR in the last 72 hours. CMP     Component Value Date/Time   NA 142 09/29/2015 0403   K 3.4 (L) 09/29/2015 0403   CL 111 09/29/2015 0403   CO2 22 09/29/2015 0403   GLUCOSE 49 (L) 09/29/2015 0403   BUN 8 09/29/2015 0403   CREATININE 0.54 09/29/2015 0403   CALCIUM 9.1 09/29/2015 0403   PROT 6.9 03/06/2015 0912   ALBUMIN 3.9 03/06/2015 0912   AST 10 03/06/2015 0912   ALT 8 03/06/2015 0912   ALKPHOS 87 03/06/2015 0912   BILITOT 0.5 03/06/2015 0912   GFRNONAA >60 09/29/2015 0403   GFRAA >60 09/29/2015 0403   Lipase     Component Value Date/Time   LIPASE 23 09/23/2011 2144       Studies/Results: No results found.  Anti-infectives: Anti-infectives    Start      Dose/Rate Route Frequency Ordered Stop   09/26/15 2300  piperacillin-tazobactam (ZOSYN) IVPB 3.375 g     3.375 g 12.5 mL/hr over 240 Minutes Intravenous Every 8 hours 09/26/15 1644     09/26/15 1730  vancomycin (VANCOCIN) IVPB 1000 mg/200 mL premix  Status:  Discontinued     1,000 mg 200 mL/hr over 60 Minutes Intravenous  Once 09/26/15 1708 09/26/15 1716   09/26/15 1730  vancomycin (VANCOCIN) 1,250 mg in sodium chloride 0.9 % 250 mL IVPB     1,250 mg 166.7 mL/hr over 90 Minutes Intravenous Every 24 hours 09/26/15 1716     09/26/15 1700  piperacillin-tazobactam (ZOSYN) IVPB 3.375 g     3.375 g 100 mL/hr over 30 Minutes Intravenous  Once 09/26/15 1643 09/26/15 1814       Assessment/Plan S/p I&D perirectal abscess, 09/26/15, Dr. Kieth Brightly - POD3 - WBC trending down, 13.6 today from 14.7, afebrile - gauze packing removed, penrose remains in place - continue BID sitz baths  Type II diabetes - SSI Hypertension  FEN:  Carb Modified ID: Zosyn day 4, vanc day 4 DVT:  lovenox, SCD's  Plan: discharge today, continue sitz baths BID, will change antibiotic to PO augmentin, follow-up next week with Dr. Kieth Brightly.   LOS: 3 days    [place] ,  [facility] Surgery 09/29/2015, 8:18 AM Pager: [phone] Consults: [phone] Mon-Fri 7:00 am-4:30 pm Sat-Sun 7:00 am-11:30 am  Agree with above.  Looks good. Her PCP is Dr. Betty [name] - she has a follow up appt with Dr. [name] on 9/20 that she will keep.  Alphonsa Overall, MD, [facility] Surgery Pager: [phone] Office phone:  [phone]

## 2015-09-29 NOTE — Discharge Instructions (Addendum)
How to Take a Sitz Bath A sitz bath is a warm water bath that is taken while you are sitting down. The water should only come up to your hips and should cover your buttocks. Your health care provider may recommend a sitz bath to help you:   Clean the lower part of your body, including your genital area.  With itching.  With pain.  With sore muscles or muscles that tighten or spasm. HOW TO TAKE A SITZ BATH Take 3-4 sitz baths per day or as told by your health care provider. 1. Partially fill a bathtub with warm water. You will only need the water to be deep enough to cover your hips and buttocks when you are sitting in it. 2. If your health care provider told you to put medicine in the water, follow the directions exactly. 3. Sit in the water and open the tub drain a little. 4. Turn on the warm water again to keep the tub at the correct level. Keep the water running constantly. 5. Soak in the water for 15-20 minutes or as told by your health care provider. 6. After the sitz bath, pat the affected area dry first. Do not rub it. 7. Be careful when you stand up after the sitz bath because you may feel dizzy. SEEK MEDICAL CARE IF:  Your symptoms get worse. Do not continue with sitz baths if your symptoms get worse.  You have new symptoms. Do not continue with sitz baths until you talk with your health care provider.   This information is not intended to replace advice given to you by your health care provider. Make sure you discuss any questions you have with your health care provider.   Document Released: 09/20/2003 Document Revised: 05/14/2014 Document Reviewed: 12/26/2013 Elsevier Interactive Patient Education 2016 Elsevier Inc.   Probiotics WHAT ARE PROBIOTICS? Probiotics are the good bacteria and yeasts that live in your body and keep you and your digestive system healthy. Probiotics also help your body's defense (immune) system and protect your body against bad bacterial growth.    Certain foods contain probiotics, such as yogurt. Probiotics can also be purchased as a supplement. As with any supplement or drug, it is important to discuss its use with your health care provider.  WHAT AFFECTS THE BALANCE OF BACTERIA IN MY BODY? The balance of bacteria in your body can be affected by:   Antibiotic medicines. Antibiotics are sometimes necessary to treat infection. Unfortunately, they may kill good or friendly bacteria in your body as well as the bad bacteria. This may lead to stomach problems like diarrhea, gas, and cramping.  Disease. Some conditions are the result of an overgrowth of bad bacteria, yeasts, parasites, or fungi. These conditions include:   Infectious diarrhea.  Stomach and respiratory infections.  Skin infections.  Irritable bowel syndrome (IBS).  Inflammatory bowel diseases.  Ulcer due to Helicobacter pylori (H. pylori) infection.  Tooth decay and periodontal disease.  Vaginal infections. Stress and poor diet may also lower the good bacteria in your body.  WHAT TYPE OF PROBIOTIC IS RIGHT FOR ME? Probiotics are available over the counter at your local pharmacy, health food, or grocery store. They come in many different forms, combinations of strains, and dosing strengths. Some may need to be refrigerated. Always read the label for storage and usage instructions. Specific strains have been shown to be more effective for certain conditions. Ask your health care provider what option is best for you.  WHY WOULD I  NEED PROBIOTICS? There are many reasons your health care provider might recommend a probiotic supplement, including:   Diarrhea.  Constipation.  IBS.  Respiratory infections.  Yeast infections.  Acne, eczema, and other skin conditions.  Frequent urinary tract infections (UTIs). ARE THERE SIDE EFFECTS OF PROBIOTICS? Some people experience mild side effects when taking probiotics. Side effects are usually temporary and may include:    Gas.  Bloating.  Cramping. Rarely, serious side effects, such as infection or immune system changes, may occur. WHAT ELSE DO I NEED TO KNOW ABOUT PROBIOTICS?   There are many different strains of probiotics. Certain strains may be more effective depending on your condition. Probiotics are available in varying doses. Ask your health care provider which probiotic you should use and how often.   If you are taking probiotics along with antibiotics, it is generally recommended to wait at least 2 hours between taking the antibiotic and taking the probiotic.  FOR MORE INFORMATION:  [facility] [email or web address]   This information is not intended to replace advice given to you by your health care provider. Make sure you discuss any questions you have with your health care provider.   Document Released: 07/25/2013 Document Reviewed: 07/25/2013 Elsevier Interactive Patient Education [employer].

## 2015-09-29 NOTE — Discharge Summary (Signed)
[facility] Discharge Summary   Patient ID: Sarah [name] MRN: [id number] DOB/AGE: [birth year] 70 y.o.  Admit date: 09/26/2015 Discharge date: 09/29/2015  Admitting Diagnosis: Perirectal abscess  Discharge Diagnosis Patient Active Problem List   Diagnosis Date Noted  . Perirectal abscess 09/26/2015  . Vitamin D deficiency 07/23/2015  . Osteopenia 02/16/2013  . Ankle fracture 01/19/2013  . Anemia 09/27/2011  . Uncontrolled type 2 diabetes mellitus with insulin therapy ([place]) 09/24/2011  . Hyperlipidemia 09/24/2011  . Hypertension 09/24/2011    Consultants None  Imaging: CT abdomen pelvis w contrast 09/26/15: Anorectal wall thickening and surrounding infiltrative changes compatible with infection with evidence of a poorly defined perianal fluid collection dorsal to the anus measuring 2.7 x 3.6 x 3.4 cm consistent with a developing perianal abscess extending into the medial LEFT buttock and tracking slightly inferiorly.  No intrapelvic extension of this inflammatory process is identified.  Procedures Dr. Kieth Brightly (09/26/15) - incision and drainage of perirectal abscess  Hospital Course:  Sarah [name] is a 70yo female with PMH significant for DM and HTN, who presented to [facility] 09[facility]26/15 with a 1 week history of perianal pain. She initially thought that it was a hemorrhoid that was not responding to OTC medications, so she went to see her PCP. Due to abnormal lab values and fever at PCP's office, she was advised to go to the ED.  Workup showed a perirectal abscess; this was her first rectal abscess.  Patient was admitted and underwent procedure listed above.  Tolerated procedure well and was transferred to the floor.  On POD3 the patient was voiding well, tolerating diet, ambulating well, pain well controlled, vital signs stable, and felt stable for discharge home.  Gauze packing was removed and penrose remains in place. Patient will follow up in our office in  1 week and knows to call with questions or concerns.  She was discharged on 10 days of augmentin, and will perform BID sitz baths.  Physical Exam: Gen:  Alert, NAD, pleasant Abd: Soft, NT/ND Wound: penrose and gauze packing in place, trace bloody drainage, no erythema --- gauze removed    Medication List    STOP taking these medications   hydrocortisone 2.5 % rectal cream Commonly known as:  ANUSOL-HC     TAKE these medications   amLODipine 10 MG tablet Commonly known as:  NORVASC Take 1 tablet (10 mg total) by mouth daily.   amoxicillin-clavulanate 875-125 MG tablet Commonly known as:  AUGMENTIN Take 1 tablet by mouth 2 (two) times daily.   aspirin EC 81 MG tablet Take 81 mg by mouth daily.   atorvastatin 20 MG tablet Commonly known as:  LIPITOR Take 1 tablet (20 mg total) by mouth daily.   HYDROcodone-acetaminophen 5-325 MG tablet Commonly known as:  NORCO/VICODIN Take 1 tablet by mouth every 6 (six) hours as needed for severe pain. What changed:  when to take this  reasons to take this   Insulin Glargine 100 UNIT/ML Solostar Pen Commonly known as:  LANTUS Use 50 Units daily at the same time. May titrate up if FG >200.   Insulin Pen Needle 32G X 4 MM Misc Use up to 4x a day   insulin regular human CONCENTRATED 500 UNIT/ML kwikpen Commonly known as:  HUMULIN R U-500 KWIKPEN Inject 70-85 Units into the skin 3 (three) times daily with meals.   metFORMIN 1000 MG tablet Commonly known as:  GLUCOPHAGE Take 1 tablet (1,000 mg total) by mouth 2 (two) times daily with  a meal. What changed:  additional instructions   saccharomyces boulardii 250 MG capsule Commonly known as:  FLORASTOR Take 1 capsule (250 mg total) by mouth 2 (two) times daily. You can get a probiotic over the counter.   TRULICITY A999333 0000000 Sopn Generic drug:  Dulaglutide INJECT 0.5 ML UNDER THE SKIN WEEKLY   valsartan-hydrochlorothiazide 320-25 MG tablet Commonly known as:   DIOVAN-HCT Take 1 tablet by mouth daily.   Vitamin D (Ergocalciferol) 50000 units Caps capsule Commonly known as:  DRISDOL TAKE 1 CAPSULE BY MOUTH TWICE A WEEK ON MONDAY AND THURSDAY        Follow-up Information    Mickeal Skinner, MD. Go today.   Specialty:  General Surgery Why:  10/10/2015 at 11:00am. Please arrive 30 minutes prior to appointment to fill out necessary paperwork. Contact information: [place] [id number] [phone]           Signed: Jerrye Beavers, [facility] Surgery 09/29/2015, 4:21 PM Pager: [phone] Consults: [phone]  Agree with above.  Alphonsa Overall, MD, [facility] Surgery Pager: [phone] Office phone:  [phone]

## 2015-09-29 NOTE — Care Management Important Message (Signed)
Important Message  Patient Details  Name: Sarah [name] MRN: [id number] Date of Birth: Dec 23, 1945   Medicare Important Message Given:  Yes    Jacquelyn Shadrick [address] 09/29/2015, 11:47 AM

## 2015-10-01 LAB — CULTURE, BLOOD (ROUTINE X 2)
Culture: NO GROWTH
Culture: NO GROWTH

## 2015-10-01 LAB — AEROBIC/ANAEROBIC CULTURE W GRAM STAIN (SURGICAL/DEEP WOUND)

## 2015-10-14 DIAGNOSIS — E1051 Type 1 diabetes mellitus with diabetic peripheral angiopathy without gangrene: Secondary | ICD-10-CM | POA: Diagnosis not present

## 2015-10-14 DIAGNOSIS — L84 Corns and callosities: Secondary | ICD-10-CM | POA: Diagnosis not present

## 2015-10-14 DIAGNOSIS — I739 Peripheral vascular disease, unspecified: Secondary | ICD-10-CM | POA: Diagnosis not present

## 2015-10-14 DIAGNOSIS — L603 Nail dystrophy: Secondary | ICD-10-CM | POA: Diagnosis not present

## 2015-10-23 ENCOUNTER — Ambulatory Visit (INDEPENDENT_AMBULATORY_CARE_PROVIDER_SITE_OTHER): Payer: Medicare Other | Admitting: Family Medicine

## 2015-10-23 ENCOUNTER — Encounter: Payer: Self-pay | Admitting: Family Medicine

## 2015-10-23 VITALS — BP 120/76 | HR 83 | Resp 12 | Ht 60.0 in | Wt 146.2 lb

## 2015-10-23 DIAGNOSIS — I10 Essential (primary) hypertension: Secondary | ICD-10-CM

## 2015-10-23 DIAGNOSIS — Z23 Encounter for immunization: Secondary | ICD-10-CM

## 2015-10-23 DIAGNOSIS — E1165 Type 2 diabetes mellitus with hyperglycemia: Secondary | ICD-10-CM

## 2015-10-23 DIAGNOSIS — Z794 Long term (current) use of insulin: Secondary | ICD-10-CM | POA: Diagnosis not present

## 2015-10-23 DIAGNOSIS — E559 Vitamin D deficiency, unspecified: Secondary | ICD-10-CM

## 2015-10-23 DIAGNOSIS — IMO0002 Reserved for concepts with insufficient information to code with codable children: Secondary | ICD-10-CM

## 2015-10-23 DIAGNOSIS — R197 Diarrhea, unspecified: Secondary | ICD-10-CM

## 2015-10-23 LAB — BASIC METABOLIC PANEL
BUN: 11 mg/dL (ref 6–23)
CO2: 30 mEq/L (ref 19–32)
Calcium: 9.9 mg/dL (ref 8.4–10.5)
Chloride: 106 mEq/L (ref 96–112)
Creatinine, Ser: 0.59 mg/dL (ref 0.40–1.20)
GFR: 130.27 mL/min (ref >60.00)
Glucose, Bld: 190 mg/dL — ABNORMAL HIGH (ref 70–99)
Potassium: 4.1 mEq/L (ref 3.5–5.1)
Sodium: 139 mEq/L (ref 135–145)

## 2015-10-23 LAB — VITAMIN D 25 HYDROXY (VIT D DEFICIENCY, FRACTURES): VITD: 22.44 ng/mL — ABNORMAL LOW (ref 30.00–100.00)

## 2015-10-23 LAB — HEMOGLOBIN A1C: Hgb A1c MFr Bld: 10.9 % — ABNORMAL HIGH (ref 4.6–6.5)

## 2015-10-23 MED ORDER — DULAGLUTIDE 1.5 MG/0.5ML ~~LOC~~ SOAJ: 1.5000 mg | SUBCUTANEOUS | 1 refills | Status: DC

## 2015-10-23 NOTE — Patient Instructions (Addendum)
A few things to remember from today's visit:   Uncontrolled type 2 diabetes mellitus with insulin therapy ([place]) - Plan: HgB 123456, Basic metabolic panel, Dulaglutide (TRULICITY) 1.5 0000000 SOPN  Essential hypertension - Plan: Basic metabolic panel  Vitamin D deficiency - Plan: VITAMIN D 25 Hydroxy (Vit-D Deficiency, Fractures)  Trulicity was increased. Monitor episodes of cramps and diarrhea, we may need to decrease metformin. Rest of the medications unchanged.   HgA1C goal < 7.0. Avoid sugar added food:regular soft drinks, energy drinks, and sports drinks. candy. cakes. cookies. pies and cobblers. sweet rolls, pastries, and donuts. fruit drinks, such as fruitades and fruit punch. dairy desserts, such as ice cream  Mediterranean diet has showed benefits for sugar control.  How much and what type of carbohydrate foods are important for managing diabetes. The balance between how much insulin is in your body and the carbohydrate you eat makes a difference in your blood glucose levels.  Fasting blood sugar ideally 130 or less, 2 hours after meals less than 180.   Regular exercise also will help with controlling disease, daily brisk walking as tolerated for 15-30 min definitively will help.   Avoid skipping meals, blood sugar might drop and cause serious problems. Remember checking feet periodically, good dental hygiene, and annual eye exam.     Smoking cessation strongly recommended. Please be sure medication list is accurate. If a new problem present, please set up appointment sooner than planned today.           Diabetes Mellitus and Food It is important for you to manage your blood sugar (glucose) level. Your blood glucose level can be greatly affected by what you eat. Eating healthier foods in the appropriate amounts throughout the day at about the same time each day will help you control your blood glucose level. It can also help slow or prevent worsening of your  diabetes mellitus. Healthy eating may even help you improve the level of your blood pressure and reach or maintain a healthy weight.  General recommendations for healthful eating and cooking habits include:  Eating meals and snacks regularly. Avoid going long periods of time without eating to lose weight.  Eating a diet that consists mainly of plant-based foods, such as fruits, vegetables, nuts, legumes, and whole grains.  Using low-heat cooking methods, such as baking, instead of high-heat cooking methods, such as deep frying. Work with your dietitian to make sure you understand how to use the Nutrition Facts information on food labels. HOW CAN FOOD AFFECT ME? Carbohydrates Carbohydrates affect your blood glucose level more than any other type of food. Your dietitian will help you determine how many carbohydrates to eat at each meal and teach you how to count carbohydrates. Counting carbohydrates is important to keep your blood glucose at a healthy level, especially if you are using insulin or taking certain medicines for diabetes mellitus. Alcohol Alcohol can cause sudden decreases in blood glucose (hypoglycemia), especially if you use insulin or take certain medicines for diabetes mellitus. Hypoglycemia can be a life-threatening condition. Symptoms of hypoglycemia (sleepiness, dizziness, and disorientation) are similar to symptoms of having too much alcohol.  If your health care provider has given you approval to drink alcohol, do so in moderation and use the following guidelines:  Women should not have more than one drink per day, and men should not have more than two drinks per day. One drink is equal to:  12 oz of beer.  5 oz of wine.  1 oz of  hard liquor.  Do not drink on an empty stomach.  Keep yourself hydrated. Have water, diet soda, or unsweetened iced tea.  Regular soda, juice, and other mixers might contain a lot of carbohydrates and should be counted. WHAT FOODS ARE NOT  RECOMMENDED? As you make food choices, it is important to remember that all foods are not the same. Some foods have fewer nutrients per serving than other foods, even though they might have the same number of calories or carbohydrates. It is difficult to get your body what it needs when you eat foods with fewer nutrients. Examples of foods that you should avoid that are high in calories and carbohydrates but low in nutrients include:  Trans fats (most processed foods list trans fats on the Nutrition Facts label).  Regular soda.  Juice.  Candy.  Sweets, such as cake, pie, doughnuts, and cookies.  Fried foods. WHAT FOODS CAN I EAT? Eat nutrient-rich foods, which will nourish your body and keep you healthy. The food you should eat also will depend on several factors, including:  The calories you need.  The medicines you take.  Your weight.  Your blood glucose level.  Your blood pressure level.  Your cholesterol level. You should eat a variety of foods, including:  Protein.  Lean cuts of meat.  Proteins low in saturated fats, such as fish, egg whites, and beans. Avoid processed meats.  Fruits and vegetables.  Fruits and vegetables that may help control blood glucose levels, such as apples, mangoes, and yams.  Dairy products.  Choose fat-free or low-fat dairy products, such as milk, yogurt, and cheese.  Grains, bread, pasta, and rice.  Choose whole grain products, such as multigrain bread, whole oats, and brown rice. These foods may help control blood pressure.  Fats.  Foods containing healthful fats, such as nuts, avocado, olive oil, canola oil, and fish. DOES EVERYONE WITH DIABETES MELLITUS HAVE THE SAME MEAL PLAN? Because every person with diabetes mellitus is different, there is not one meal plan that works for everyone. It is very important that you meet with a dietitian who will help you create a meal plan that is just right for you.   This information is not  intended to replace advice given to you by your health care provider. Make sure you discuss any questions you have with your health care provider.   Document Released: 09/24/2004 Document Revised: 01/18/2014 Document Reviewed: 11/24/2012 Elsevier Interactive Patient Education [employer].

## 2015-10-23 NOTE — Progress Notes (Signed)
HPI:   [name] is a 70 y.o. female, who is here today to follow on some of her chronic medical problems.  Recently underwent surgical procedure for perianal abscess, hospitalized from 09/26/15-09/29/15, she completed abx treatment and followed with surgeon a few days ago, 10/09/15.   DM II:  FG 202 today, running in the mid 200's. Currently she is on Metformin 123XX123 mg bid, Trulicity [id number] mg weekly, and Lantus 50 U daily. She denies any hypoglycemia. She has not been consistent with dietary recommendations. She has not exercise regularly.  Occasionally she has intermittent abdominal cramps and soft stools, she is not sure about the frequency, has ben going on for a while, she feels like it is related with Metformin. Trulicity was added about 3 months ago, denies any side effect.   Lab Results  Component Value Date   HGBA1C 13.7 (H) 07/23/2015      Lab Results  Component Value Date   CHOL 127 03/06/2015   HDL 42.00 03/06/2015   LDLCALC 61 03/06/2015   TRIG 118.0 03/06/2015   CHOLHDL 3 03/06/2015    Hypertension: Currently she is on Amlodipine 20 mg and Valsartan-HCTZ 320-25 mg daily.   Reporting no side effects from medication. She denies any frequent/severe headache, visual changes, chest pain, dyspnea, palpitation, nausea, vomiting, or edema.  Vit D deficiency, she is on Ergocalciferol 50,000 U 2 times per week.  Still smoking.   Review of Systems  Constitutional: Positive for fatigue. Negative for appetite change, diaphoresis, fever and unexpected weight change.  HENT: Negative for mouth sores, nosebleeds and trouble swallowing.   Eyes: Negative for redness and visual disturbance.  Respiratory: Negative for cough, shortness of breath and wheezing.   Cardiovascular: Negative for chest pain, palpitations and leg swelling.  Gastrointestinal: Positive for abdominal pain and diarrhea. Negative for nausea and vomiting.       Negative for  changes in bowel habits.  Endocrine: Negative for polydipsia, polyphagia and polyuria.  Genitourinary: Negative for decreased urine volume, difficulty urinating, dysuria and hematuria.  Musculoskeletal: Positive for arthralgias (chronic). Negative for myalgias.  Skin: Negative for color change and rash.  Neurological: Negative for syncope, weakness, numbness and headaches.  Psychiatric/Behavioral: Negative for confusion. The patient is not nervous/anxious.       Current Outpatient Prescriptions on File Prior to Visit  Medication Sig Dispense Refill  . amLODipine (NORVASC) 10 MG tablet Take 1 tablet (10 mg total) by mouth daily. 90 tablet 1  . aspirin EC 81 MG tablet Take 81 mg by mouth daily.    Marland Kitchen atorvastatin (LIPITOR) 20 MG tablet Take 1 tablet (20 mg total) by mouth daily. 90 tablet 1  . Insulin Glargine (LANTUS) 100 UNIT/ML Solostar Pen Use 50 Units daily at the same time. May titrate up if FG >200. 15 mL 3  . Insulin Pen Needle 32G X 4 MM MISC Use up to 4x a day 200 each 11  . insulin regular human CONCENTRATED (HUMULIN R U-500 KWIKPEN) 500 UNIT/ML kwikpen Inject 70-85 Units into the skin 3 (three) times daily with meals. 4 pen 0  . metFORMIN (GLUCOPHAGE) 1000 MG tablet Take 1 tablet (1,000 mg total) by mouth 2 (two) times daily with a meal. (Patient taking differently: Take 1,000 mg by mouth 2 (two) times daily with a meal. Only takes once a day) 180 tablet 1  . saccharomyces boulardii (FLORASTOR) 250 MG capsule Take 1 capsule (250 mg total) by mouth 2 (two) times daily.  You can get a probiotic over the counter. 60 capsule 0  . valsartan-hydrochlorothiazide (DIOVAN-HCT) 320-25 MG tablet Take 1 tablet by mouth daily. 90 tablet 1  . Vitamin D, Ergocalciferol, (DRISDOL) 50000 units CAPS capsule TAKE 1 CAPSULE BY MOUTH TWICE A WEEK ON MONDAY AND THURSDAY 8 capsule 1   No current facility-administered medications on file prior to visit.      Past Medical History:  Diagnosis Date  .  Boils   . Glaucoma   . Heart murmur   . HTN (hypertension)   . Hx of adenomatous colonic polyps   . Hyperlipidemia   . Osteoporosis   . Pneumonia   . Type II or unspecified type diabetes mellitus without mention of complication, uncontrolled    No Known Allergies  Social History   Social History  . Marital status: Single    Spouse name: N/A  . Number of children: N/A  . Years of education: N/A   Social History Main Topics  . Smoking status: Current Some Day Smoker  . Smokeless tobacco: Never Used     Comment: smokes occ.   Marland Kitchen Alcohol use 0.0 oz/week     Comment: occ  . Drug use: No  . Sexual activity: Not Asked   Other Topics Concern  . None   Social History Narrative  . None    Vitals:   10/23/15 1023  BP: 120/76  Pulse: 83  Resp: 12   O2 sat 98% at RA.  Body mass index is 28.56 kg/m.    Physical Exam  Nursing note and vitals reviewed. Constitutional: She is oriented to person, place, and time. She appears well-developed. No distress.  HENT:  Head: Atraumatic.  Mouth/Throat: Oropharynx is clear and moist and mucous membranes are normal. She has dentures.  Eyes: Conjunctivae and EOM are normal. Pupils are equal, round, and reactive to light.  Neck: No JVD present.  Cardiovascular: Normal rate and regular rhythm.   Murmur (SEM I/VI RUSB) heard. DP pulses present bilateral.  Respiratory: Effort normal and breath sounds normal. No respiratory distress.  GI: Soft. Bowel sounds are normal. She exhibits no mass. There is no hepatomegaly. There is no tenderness.  Musculoskeletal: She exhibits no edema.  Lymphadenopathy:    She has no cervical adenopathy.  Neurological: She is alert and oriented to person, place, and time. She has normal strength. Coordination and gait normal.  Skin: Skin is warm. No erythema.  Psychiatric: She has a normal mood and affect.  good eye contact.    Foot exam 07/23/15.    ASSESSMENT AND PLAN:     Sarah [name] was seen  today for follow-up.  Diagnoses and all orders for this visit:  Uncontrolled type 2 diabetes mellitus with insulin therapy ([place])   HgA1C has not been at goal and reported BS's even though better still not at goal. Trulicity increased from 0.75 mg to 1.5 mg, will follow labs and adjust treatment. Regular exercise and healthy diet with avoidance of added sugar food intake is an important part of treatment and recommended. Annual eye exam, periodic dental and foot care recommended. F/U in 4-5 months  -     HgB A1c -     Basic metabolic panel -     Dulaglutide (TRULICITY) 1.5 0000000 SOPN; Inject 1.5 mg into the skin once a week.  Essential hypertension  Adequately controlled. No changes in current management. DASH diet recommended. Eye exam recommended annually. F/U in 4-6 months, before if needed.  -  Basic metabolic panel  Vitamin D deficiency  No changes in current management, will follow labs done today and will give further recommendations accordingly.  -     VITAMIN D 25 Hydroxy (Vit-D Deficiency, Fractures)  Diarrhea, unspecified type  Colonoscopy 11/2010: normal, 7 years follow up recommended. Metformin side effects discussed, she would like to continue medication, will monitor for frequency. We may need to discontinue medication. Instructed about warning signs.  Strongly encouraged to quit smoking.   Need for immunization against influenza -     Flu vaccine HIGH DOSE PF      -Ms. RONDI MCNETT was advised to return sooner than planned today if new concerns arise.       Carollynn Pennywell G. [name], MD  [facility]. [place] office.

## 2015-10-23 NOTE — Progress Notes (Signed)
Pre visit review using our clinic review tool, if applicable. No additional management support is needed unless otherwise documented below in the visit note. 

## 2015-10-25 ENCOUNTER — Encounter: Payer: Self-pay | Admitting: Family Medicine

## 2015-10-28 ENCOUNTER — Other Ambulatory Visit: Payer: Self-pay

## 2015-10-28 MED ORDER — INSULIN GLARGINE 100 UNIT/ML SOLOSTAR PEN: PEN_INJECTOR | SUBCUTANEOUS | 3 refills | Status: DC

## 2015-11-19 DIAGNOSIS — G8929 Other chronic pain: Secondary | ICD-10-CM | POA: Diagnosis not present

## 2015-11-19 DIAGNOSIS — M25571 Pain in right ankle and joints of right foot: Secondary | ICD-10-CM | POA: Diagnosis not present

## 2015-12-10 DIAGNOSIS — M25571 Pain in right ankle and joints of right foot: Secondary | ICD-10-CM | POA: Diagnosis not present

## 2015-12-10 DIAGNOSIS — M25671 Stiffness of right ankle, not elsewhere classified: Secondary | ICD-10-CM | POA: Diagnosis not present

## 2015-12-10 DIAGNOSIS — G8929 Other chronic pain: Secondary | ICD-10-CM | POA: Diagnosis not present

## 2015-12-11 DIAGNOSIS — M25671 Stiffness of right ankle, not elsewhere classified: Secondary | ICD-10-CM | POA: Diagnosis not present

## 2015-12-11 DIAGNOSIS — G8929 Other chronic pain: Secondary | ICD-10-CM | POA: Diagnosis not present

## 2015-12-11 DIAGNOSIS — M25571 Pain in right ankle and joints of right foot: Secondary | ICD-10-CM | POA: Diagnosis not present

## 2016-02-04 ENCOUNTER — Telehealth: Payer: Self-pay | Admitting: Family Medicine

## 2016-02-04 NOTE — Telephone Encounter (Signed)
Patient needs appointment.

## 2016-02-04 NOTE — Telephone Encounter (Signed)
Pt has a cough and would like cough med send to [facility] [address]. Pt is aware may need an appt

## 2016-02-04 NOTE — Telephone Encounter (Signed)
Pt has been sch for tomorrow °

## 2016-02-05 ENCOUNTER — Ambulatory Visit (INDEPENDENT_AMBULATORY_CARE_PROVIDER_SITE_OTHER): Payer: Medicare Other | Admitting: Family Medicine

## 2016-02-05 ENCOUNTER — Encounter: Payer: Self-pay | Admitting: Family Medicine

## 2016-02-05 VITALS — BP 128/80 | HR 66 | Temp 98.1°F | Resp 12 | Ht 60.0 in | Wt 146.0 lb

## 2016-02-05 DIAGNOSIS — R05 Cough: Secondary | ICD-10-CM | POA: Diagnosis not present

## 2016-02-05 DIAGNOSIS — R059 Cough, unspecified: Secondary | ICD-10-CM

## 2016-02-05 DIAGNOSIS — R0989 Other specified symptoms and signs involving the circulatory and respiratory systems: Secondary | ICD-10-CM

## 2016-02-05 DIAGNOSIS — J189 Pneumonia, unspecified organism: Secondary | ICD-10-CM | POA: Diagnosis not present

## 2016-02-05 DIAGNOSIS — J989 Respiratory disorder, unspecified: Secondary | ICD-10-CM

## 2016-02-05 MED ORDER — ALBUTEROL SULFATE HFA 108 (90 BASE) MCG/ACT IN AERS: 2.0000 | INHALATION_SPRAY | Freq: Four times a day (QID) | RESPIRATORY_TRACT | 0 refills | Status: DC | PRN

## 2016-02-05 MED ORDER — DOXYCYCLINE HYCLATE 100 MG PO TABS: 100.0000 mg | ORAL_TABLET | Freq: Two times a day (BID) | ORAL | 0 refills | Status: AC

## 2016-02-05 MED ORDER — PREDNISONE 20 MG PO TABS: 40.0000 mg | ORAL_TABLET | Freq: Every day | ORAL | 0 refills | Status: AC

## 2016-02-05 MED ORDER — IPRATROPIUM-ALBUTEROL 0.5-2.5 (3) MG/3ML IN SOLN
3.0000 mL | Freq: Once | RESPIRATORY_TRACT | Status: AC
  Administered 2016-02-05: 3 mL via RESPIRATORY_TRACT

## 2016-02-05 NOTE — Patient Instructions (Signed)
A few things to remember from today's visit:   Reactive airway disease that is not asthma - Plan: ipratropium-albuterol (DUONEB) 0.5-2.5 (3) MG/3ML nebulizer solution 3 mL, DG Chest 2 View  Cough - Plan: DG Chest 2 View  Albuterol inhaler 2 puffs every 6 hours for a week then as needed. Take antibiotic and prednisone with food. Monitor blood sugars, resume diabetes medication, if blood sugars 200s or more increase Lantus by 5 units every 5-7 days. Keep your next appointment. Monitor for signs of complications: Difficulty breathing, fever,worsening symptoms among some.  Please be sure medication list is accurate. If a new problem present, please set up appointment sooner than planned today.

## 2016-02-05 NOTE — Progress Notes (Signed)
Pre visit review using our clinic review tool, if applicable. No additional management support is needed unless otherwise documented below in the visit note. 

## 2016-02-05 NOTE — Progress Notes (Signed)
HPI:  ACUTE VISIT  Chief Complaint  Patient presents with  . Cough    [name] is a 71 y.o.female here today complaining of 10 days of respiratory symptoms. Initial symptoms initially were flu like symptoms,although no fever. Most symptoms have resolved.   Persistent wet cough, not able to bring sputum up. Mild residual nasal congestion, rhinorrhea, and post nasal drainage.  She has not noted chest pain or dyspnea. + Wheezing, mainly at night.  No Hx of recent travel. + Sick contact, family members were sick around same time. No known insect bite.  Hx of allergies: Denies. + Smoker.  OTC medications for this problem: Mucinex  Symptoms otherwise stable.   Hx of DM II, her BS's have been elevated but attributed to not taking her medications during 12/2015 due to "donut hole", she is planning on picking up her medications today.    Review of Systems  Constitutional: Negative for activity change, appetite change, fatigue and fever.  HENT: Positive for congestion and postnasal drip. Negative for mouth sores, nosebleeds, sore throat, trouble swallowing and voice change.   Respiratory: Positive for cough and wheezing. Negative for shortness of breath.   Cardiovascular: Negative for chest pain, palpitations and leg swelling.  Gastrointestinal: Negative for abdominal pain, diarrhea, nausea and vomiting.  Musculoskeletal: Negative for gait problem and myalgias.  Skin: Negative for rash.  Neurological: Negative for syncope, weakness and headaches.  Hematological: Negative for adenopathy. Does not bruise/bleed easily.  Psychiatric/Behavioral: Negative for confusion.      Current Outpatient Prescriptions on File Prior to Visit  Medication Sig Dispense Refill  . amLODipine (NORVASC) 10 MG tablet Take 1 tablet (10 mg total) by mouth daily. 90 tablet 1  . aspirin EC 81 MG tablet Take 81 mg by mouth daily.    Marland Kitchen atorvastatin (LIPITOR) 20 MG tablet Take 1  tablet (20 mg total) by mouth daily. 90 tablet 1  . Dulaglutide (TRULICITY) 1.5 0000000 SOPN Inject 1.5 mg into the skin once a week. 13 pen 1  . Insulin Glargine (LANTUS) 100 UNIT/ML Solostar Pen Use 60 Units daily at the same time. May titrate up if FG >200. 15 mL 3  . Insulin Pen Needle 32G X 4 MM MISC Use up to 4x a day 200 each 11  . insulin regular human CONCENTRATED (HUMULIN R U-500 KWIKPEN) 500 UNIT/ML kwikpen Inject 70-85 Units into the skin 3 (three) times daily with meals. 4 pen 0  . metFORMIN (GLUCOPHAGE) 1000 MG tablet Take 1 tablet (1,000 mg total) by mouth 2 (two) times daily with a meal. (Patient taking differently: Take 1,000 mg by mouth 2 (two) times daily with a meal. Only takes once a day) 180 tablet 1  . saccharomyces boulardii (FLORASTOR) 250 MG capsule Take 1 capsule (250 mg total) by mouth 2 (two) times daily. You can get a probiotic over the counter. 60 capsule 0  . valsartan-hydrochlorothiazide (DIOVAN-HCT) 320-25 MG tablet Take 1 tablet by mouth daily. 90 tablet 1  . Vitamin D, Ergocalciferol, (DRISDOL) 50000 units CAPS capsule TAKE 1 CAPSULE BY MOUTH TWICE A WEEK ON MONDAY AND THURSDAY 8 capsule 1   No current facility-administered medications on file prior to visit.      Past Medical History:  Diagnosis Date  . Boils   . Glaucoma   . Heart murmur   . HTN (hypertension)   . Hx of adenomatous colonic polyps   . Hyperlipidemia   . Osteoporosis   .  Pneumonia   . Type II or unspecified type diabetes mellitus without mention of complication, uncontrolled    No Known Allergies  Social History   Social History  . Marital status: Single    Spouse name: N/A  . Number of children: N/A  . Years of education: N/A   Social History Main Topics  . Smoking status: Current Some Day Smoker  . Smokeless tobacco: Never Used     Comment: smokes occ.   Marland Kitchen Alcohol use 0.0 oz/week     Comment: occ  . Drug use: No  . Sexual activity: Not Asked   Other Topics Concern    . None   Social History Narrative  . None    Vitals:   02/05/16 0927  BP: 128/80  Pulse: 66  Resp: 12  Temp: 98.1 F (36.7 C)   O2 sat 97% at RA.  Body mass index is 28.51 kg/m.  Physical Exam  Nursing note and vitals reviewed. Constitutional: She is oriented to person, place, and time. She appears well-developed. She does not appear ill. No distress.  HENT:  Head: Atraumatic.  Nose: Rhinorrhea present.  Mouth/Throat: Oropharynx is clear and moist and mucous membranes are normal.  Hypertrophic turbinates.  Eyes: Conjunctivae are normal.  Cardiovascular: Normal rate and regular rhythm.   Murmur (SEM I/VI RUSB) heard. Respiratory: Effort normal. No stridor. No respiratory distress. She has wheezes. Rales: very mild fine rales right base.  Musculoskeletal: She exhibits no edema.  Lymphadenopathy:    She has no cervical adenopathy.  Neurological: She is alert and oriented to person, place, and time. She has normal strength.  Skin: Skin is warm. No rash noted. No erythema.  Psychiatric: She has a normal mood and affect. Her speech is normal.  Well groomed, good eye contact.      ASSESSMENT AND PLAN:   Sarah [name] was seen today for cough.  Diagnoses and all orders for this visit:  Reactive airway disease that is not asthma  Wheezing improved but still present after Duoneb neb treatment. Albuterol inh 2 puff every 6 hours for a week then as needed for wheezing or shortness of breath.  Prednisone side effects discussed, Lantus dose to be adjusted as instructed if BS's > 200. Strongly recommend quitting smoking. Instructed about warning signs. Keep next appt, next months.   -     ipratropium-albuterol (DUONEB) 0.5-2.5 (3) MG/3ML nebulizer solution 3 mL; Take 3 mLs by nebulization once. -     DG Chest 2 View; Future -     predniSONE (DELTASONE) 20 MG tablet; Take 2 tablets (40 mg total) by mouth daily with breakfast. -     albuterol (PROVENTIL HFA;VENTOLIN HFA) 108  (90 Base) MCG/ACT inhaler; Inhale 2 puffs into the lungs every 6 (six) hours as needed for wheezing or shortness of breath.  Cough  Explained cough and congestion could last a few days or weeks , may last longer due to  Tobacco use. Plain Mucinex may help. -     DG Chest 2 View; Future  Pneumonia, unspecified organism  Rales right base. Abx treatment started. Instructed about warning signs. She has an appointment in 02/2016, instructed to come earlier if needed.  -     doxycycline (VIBRA-TABS) 100 MG tablet; Take 1 tablet (100 mg total) by mouth 2 (two) times daily.       Smoking cessation encouraged.    -[name] advised to return or notify a doctor immediately if symptoms worsen or persist  or new concerns arise.       [name]. [name], MD  [facility]. [place] office.

## 2016-02-06 ENCOUNTER — Ambulatory Visit (INDEPENDENT_AMBULATORY_CARE_PROVIDER_SITE_OTHER)
Admission: RE | Admit: 2016-02-06 | Discharge: 2016-02-06 | Disposition: A | Discharge status: Home / Self Care | Payer: Medicare Other | Source: Ambulatory Visit | Attending: Family Medicine | Admitting: Family Medicine

## 2016-02-06 DIAGNOSIS — R059 Cough, unspecified: Secondary | ICD-10-CM

## 2016-02-06 DIAGNOSIS — R0989 Other specified symptoms and signs involving the circulatory and respiratory systems: Secondary | ICD-10-CM

## 2016-02-06 DIAGNOSIS — R05 Cough: Secondary | ICD-10-CM | POA: Diagnosis not present

## 2016-02-06 DIAGNOSIS — J989 Respiratory disorder, unspecified: Secondary | ICD-10-CM

## 2016-02-09 ENCOUNTER — Encounter: Payer: Self-pay | Admitting: Family Medicine

## 2016-02-23 ENCOUNTER — Ambulatory Visit: Payer: Medicare Other | Admitting: Family Medicine

## 2016-03-01 ENCOUNTER — Ambulatory Visit (INDEPENDENT_AMBULATORY_CARE_PROVIDER_SITE_OTHER): Payer: Medicare Other | Admitting: Family Medicine

## 2016-03-01 ENCOUNTER — Encounter: Payer: Self-pay | Admitting: Family Medicine

## 2016-03-01 VITALS — BP 130/80 | HR 82 | Resp 12 | Ht 60.0 in | Wt 147.2 lb

## 2016-03-01 DIAGNOSIS — E785 Hyperlipidemia, unspecified: Secondary | ICD-10-CM

## 2016-03-01 DIAGNOSIS — Z794 Long term (current) use of insulin: Secondary | ICD-10-CM

## 2016-03-01 DIAGNOSIS — E1165 Type 2 diabetes mellitus with hyperglycemia: Secondary | ICD-10-CM

## 2016-03-01 DIAGNOSIS — I10 Essential (primary) hypertension: Secondary | ICD-10-CM

## 2016-03-01 DIAGNOSIS — E559 Vitamin D deficiency, unspecified: Secondary | ICD-10-CM | POA: Diagnosis not present

## 2016-03-01 DIAGNOSIS — IMO0002 Reserved for concepts with insufficient information to code with codable children: Secondary | ICD-10-CM

## 2016-03-01 NOTE — Progress Notes (Signed)
HPI:   Sarah [name] is a 71 y.o. female, who is here today to follow on some chronic medical problems.  She was last seen for acute visit on 02/05/16, reporting that symptoms have resolved.   Hyperlipidemia:  Currently on Lipitor 20 mg daily. Following a low fat diet: Yes. She has not noted side effects with medication.  Lab Results  Component Value Date   CHOL 127 03/06/2015   HDL 42.00 03/06/2015   LDLCALC 61 03/06/2015   TRIG 118.0 03/06/2015   CHOLHDL 3 03/06/2015      Diabetes Mellitus II:    Currently on Metformin [id number] mg daily,Trulicity 1.5 mg weekly, and Lantus 60 U daily. Lantus she takes around noon. She resumed medications about 5-6 weeks ago because could not afford them. Problem has been poorly controlled. Checking BS's : FG 190's Hypoglycemia:None  She is tolerating medications well. She denies abdominal pain, nausea, vomiting, polydipsia, polyuria, or polyphagia. No numbness, tingling, or burning.  She has been doing better with her diet. She decreased sodas intake and now drinking regular Pepsis 1-2 daily. She is not exercising regularly.    Lab Results  Component Value Date   CREATININE 0.59 10/23/2015   BUN 11 10/23/2015   NA 139 10/23/2015   K 4.1 10/23/2015   CL 106 10/23/2015   CO2 30 10/23/2015    Lab Results  Component Value Date   HGBA1C 10.9 (H) 10/23/2015   Lab Results  Component Value Date   MICROALBUR 25.8 (H) 03/06/2015    Hypertension: Currently she is on amlodipine 10 mg valsartan/hydrochlorothiazide 320-25 mg daily Reporting no side effects from medication. She denies any frequent/severe headache, visual changes, chest pain, dyspnea, palpitation,claudication like symptoms,or edema. No new concerns today.   Vitamin D deficiency: She is on OTC vitamin D 5000 units daily.   Review of Systems  Constitutional: Positive for fatigue (no more than usual.). Negative for activity change, appetite change,  fever and unexpected weight change.  HENT: Negative for mouth sores, nosebleeds and trouble swallowing.   Eyes: Negative for pain, redness and visual disturbance.  Respiratory: Negative for cough, shortness of breath and wheezing.   Cardiovascular: Negative for chest pain, palpitations and leg swelling.  Gastrointestinal: Negative for abdominal pain, nausea and vomiting.       Negative for changes in bowel habits.  Genitourinary: Negative for decreased urine volume, dysuria and hematuria.  Musculoskeletal: Negative for gait problem and myalgias.  Skin: Negative for rash.  Neurological: Negative for syncope, weakness, numbness and headaches.  Psychiatric/Behavioral: Negative for confusion. The patient is not nervous/anxious.       Current Outpatient Prescriptions on File Prior to Visit  Medication Sig Dispense Refill  . albuterol (PROVENTIL HFA;VENTOLIN HFA) 108 (90 Base) MCG/ACT inhaler Inhale 2 puffs into the lungs every 6 (six) hours as needed for wheezing or shortness of breath. 1 Inhaler 0  . amLODipine (NORVASC) 10 MG tablet Take 1 tablet (10 mg total) by mouth daily. 90 tablet 1  . aspirin EC 81 MG tablet Take 81 mg by mouth daily.    Marland Kitchen atorvastatin (LIPITOR) 20 MG tablet Take 1 tablet (20 mg total) by mouth daily. 90 tablet 1  . Dulaglutide (TRULICITY) 1.5 0000000 SOPN Inject 1.5 mg into the skin once a week. 13 pen 1  . Insulin Glargine (LANTUS) 100 UNIT/ML Solostar Pen Use 60 Units daily at the same time. May titrate up if FG >200. 15 mL 3  . Insulin Pen  Needle 32G X 4 MM MISC Use up to 4x a day 200 each 11  . insulin regular human CONCENTRATED (HUMULIN R U-500 KWIKPEN) 500 UNIT/ML kwikpen Inject 70-85 Units into the skin 3 (three) times daily with meals. 4 pen 0  . metFORMIN (GLUCOPHAGE) 1000 MG tablet Take 1 tablet (1,000 mg total) by mouth 2 (two) times daily with a meal. (Patient taking differently: Take 1,000 mg by mouth 2 (two) times daily with a meal. Only takes once a day)  180 tablet 1  . saccharomyces boulardii (FLORASTOR) 250 MG capsule Take 1 capsule (250 mg total) by mouth 2 (two) times daily. You can get a probiotic over the counter. 60 capsule 0  . valsartan-hydrochlorothiazide (DIOVAN-HCT) 320-25 MG tablet Take 1 tablet by mouth daily. 90 tablet 1  . Vitamin D, Ergocalciferol, (DRISDOL) 50000 units CAPS capsule TAKE 1 CAPSULE BY MOUTH TWICE A WEEK ON MONDAY AND THURSDAY 8 capsule 1   No current facility-administered medications on file prior to visit.      Past Medical History:  Diagnosis Date  . Boils   . Glaucoma   . Heart murmur   . HTN (hypertension)   . Hx of adenomatous colonic polyps   . Hyperlipidemia   . Osteoporosis   . Pneumonia   . Type II or unspecified type diabetes mellitus without mention of complication, uncontrolled    No Known Allergies  Social History   Social History  . Marital status: Single    Spouse name: N/A  . Number of children: N/A  . Years of education: N/A   Social History Main Topics  . Smoking status: Current Some Day Smoker  . Smokeless tobacco: Never Used     Comment: smokes occ.   Marland Kitchen Alcohol use 0.0 oz/week     Comment: occ  . Drug use: No  . Sexual activity: Not Asked   Other Topics Concern  . None   Social History Narrative  . None    Vitals:   03/01/16 0908  BP: 130/80  Pulse: 82  Resp: 12   Body mass index is 28.76 kg/m.   Wt Readings from Last 3 Encounters:  03/01/16 147 lb 4 oz (66.8 kg)  02/05/16 146 lb (66.2 kg)  10/23/15 146 lb 4 oz (66.3 kg)    Physical Exam  Nursing note and vitals reviewed. Constitutional: She is oriented to person, place, and time. She appears well-developed and well-nourished. No distress.  HENT:  Head: Atraumatic.  Mouth/Throat: Oropharynx is clear and moist and mucous membranes are normal.  Eyes: Conjunctivae and EOM are normal. Pupils are equal, round, and reactive to light.  Cardiovascular: Normal rate and regular rhythm.   Murmur (SEM I/VI  RUSB) heard. DP pulses present bilateral.  Respiratory: Effort normal and breath sounds normal. No respiratory distress.  GI: Soft. Bowel sounds are normal. She exhibits no mass. There is no hepatomegaly. There is no tenderness.  Musculoskeletal: She exhibits edema (trace pitting edema LE, bilateral.).  Lymphadenopathy:    She has no cervical adenopathy.  Neurological: She is alert and oriented to person, place, and time. She has normal strength. Coordination and gait normal.  Skin: Skin is warm. No erythema.  Psychiatric: She has a normal mood and affect.  Well groomed, good eye contact.   Foot exam 07/23/15.    ASSESSMENT AND PLAN:    Aura was seen today for follow-up.  Diagnoses and all orders for this visit:  Uncontrolled type 2 diabetes mellitus with insulin therapy (  [place])  HgA1C has not been at goal. No changes in current management, since she just resumed medications about 4 weeks ago, labs to be done in 4-6 weeks. Adverse effects of poorly controlled DM discussed. Regular exercise and healthy diet with avoidance of added sugar food intake is an important part of treatment and recommended. Annual eye exam, periodic dental and foot care recommended. F/U in 4-5 months  -     Hemoglobin A1c; Future -     Microalbumin / creatinine urine ratio; Future -     Comprehensive metabolic panel; Future  Hyperlipidemia, unspecified hyperlipidemia type  No changes in current management, will follow labs and will give further recommendations accordingly. Low fat diet discussed. F/U in 6-12 months.  -     Lipid panel; Future -     Comprehensive metabolic panel; Future  Vitamin D deficiency  No changes in current management, will follow labs and will give further recommendations accordingly.  -     VITAMIN D 25 Hydroxy (Vit-D Deficiency, Fractures); Future -     Comprehensive metabolic panel; Future  Essential hypertension  Adequately controlled. No changes in current  management. DASH-low salt diet recommended. Continue regular eye exam. F/U in 4-5 months, before if needed.  -     Comprehensive metabolic panel; Future       -Sarah [name] was advised to return sooner than planned today if new concerns arise.       Jiovanni Heeter G. [name], MD  [facility]. [place] office.

## 2016-03-01 NOTE — Progress Notes (Signed)
Pre visit review using our clinic review tool, if applicable. No additional management support is needed unless otherwise documented below in the visit note. 

## 2016-03-01 NOTE — Patient Instructions (Signed)
A few things to remember from today's visit:   Uncontrolled type 2 diabetes mellitus with insulin therapy ([place])  Hyperlipidemia, unspecified hyperlipidemia type  Vitamin D deficiency  Essential hypertension   Labs in 4 weeks,fasting. No changes today. Stop sodas.  HgA1C goal < 7.0. Avoid sugar added food:regular soft drinks, energy drinks, and sports drinks. candy. cakes. cookies. pies and cobblers. sweet rolls, pastries, and donuts. fruit drinks, such as fruitades and fruit punch. dairy desserts, such as ice cream  Mediterranean diet has showed benefits for sugar control.  How much and what type of carbohydrate foods are important for managing diabetes. The balance between how much insulin is in your body and the carbohydrate you eat makes a difference in your blood glucose levels.  Fasting blood sugar ideally 130 or less, 2 hours after meals less than 180.   Regular exercise also will help with controlling disease, daily brisk walking as tolerated for 15-30 min definitively will help.   Avoid skipping meals, blood sugar might drop and cause serious problems. Remember checking feet periodically, good dental hygiene, and annual eye exam.      Please be sure medication list is accurate. If a new problem present, please set up appointment sooner than planned today.

## 2016-03-10 DIAGNOSIS — E119 Type 2 diabetes mellitus without complications: Secondary | ICD-10-CM | POA: Diagnosis not present

## 2016-03-10 DIAGNOSIS — H40053 Ocular hypertension, bilateral: Secondary | ICD-10-CM | POA: Diagnosis not present

## 2016-03-29 ENCOUNTER — Other Ambulatory Visit (INDEPENDENT_AMBULATORY_CARE_PROVIDER_SITE_OTHER): Payer: Medicare Other

## 2016-03-29 DIAGNOSIS — E1165 Type 2 diabetes mellitus with hyperglycemia: Secondary | ICD-10-CM | POA: Diagnosis not present

## 2016-03-29 DIAGNOSIS — Z794 Long term (current) use of insulin: Secondary | ICD-10-CM | POA: Diagnosis not present

## 2016-03-29 DIAGNOSIS — E785 Hyperlipidemia, unspecified: Secondary | ICD-10-CM

## 2016-03-29 DIAGNOSIS — E559 Vitamin D deficiency, unspecified: Secondary | ICD-10-CM | POA: Diagnosis not present

## 2016-03-29 DIAGNOSIS — IMO0002 Reserved for concepts with insufficient information to code with codable children: Secondary | ICD-10-CM

## 2016-03-29 DIAGNOSIS — I10 Essential (primary) hypertension: Secondary | ICD-10-CM | POA: Diagnosis not present

## 2016-03-29 LAB — COMPREHENSIVE METABOLIC PANEL
ALT: 7 U/L (ref 0–35)
AST: 10 U/L (ref 0–37)
Albumin: 3.8 g/dL (ref 3.5–5.2)
Alkaline Phosphatase: 70 U/L (ref 39–117)
BUN: 12 mg/dL (ref 6–23)
CO2: 29 mEq/L (ref 19–32)
Calcium: 10.1 mg/dL (ref 8.4–10.5)
Chloride: 106 mEq/L (ref 96–112)
Creatinine, Ser: 0.58 mg/dL (ref 0.40–1.20)
GFR: 132.7 mL/min (ref >60.00)
Glucose, Bld: 213 mg/dL — ABNORMAL HIGH (ref 70–99)
Potassium: 4.3 mEq/L (ref 3.5–5.1)
Sodium: 141 mEq/L (ref 135–145)
Total Bilirubin: 0.4 mg/dL (ref 0.2–1.2)
Total Protein: 6.2 g/dL (ref 6.0–8.3)

## 2016-03-29 LAB — MICROALBUMIN / CREATININE URINE RATIO
Creatinine,U: 221.2 mg/dL
Microalb Creat Ratio: 35.5 mg/g — ABNORMAL HIGH (ref 0.0–30.0)
Microalb, Ur: 78.5 mg/dL — ABNORMAL HIGH (ref 0.0–1.9)

## 2016-03-29 LAB — LIPID PANEL
Cholesterol: 127 mg/dL (ref 0–200)
HDL: 48.8 mg/dL (ref >39.00)
LDL Cholesterol: 61 mg/dL (ref 0–99)
NonHDL: 78.21
Total CHOL/HDL Ratio: 3
Triglycerides: 84 mg/dL (ref 0.0–149.0)
VLDL: 16.8 mg/dL (ref 0.0–40.0)

## 2016-03-29 LAB — HEMOGLOBIN A1C: Hgb A1c MFr Bld: 11.4 % — ABNORMAL HIGH (ref 4.6–6.5)

## 2016-03-29 LAB — VITAMIN D 25 HYDROXY (VIT D DEFICIENCY, FRACTURES): VITD: 24.21 ng/mL — ABNORMAL LOW (ref 30.00–100.00)

## 2016-04-05 ENCOUNTER — Telehealth: Payer: Self-pay

## 2016-04-05 NOTE — Telephone Encounter (Signed)
Which Humalog do you want to send in?

## 2016-04-08 ENCOUNTER — Other Ambulatory Visit: Payer: Self-pay

## 2016-04-08 MED ORDER — INSULIN LISPRO 200 UNIT/ML ~~LOC~~ SOPN: 10.0000 [IU] | PEN_INJECTOR | Freq: Three times a day (TID) | SUBCUTANEOUS | 1 refills | Status: DC

## 2016-04-08 NOTE — Telephone Encounter (Signed)
Please check and see if this is the correct one you want to send.  Thank you!

## 2016-04-12 ENCOUNTER — Telehealth: Payer: Self-pay

## 2016-04-12 NOTE — Telephone Encounter (Signed)
Received PA request from [facility] for Trulicity. Pa submitted & is pending. Key: [id number]

## 2016-04-13 NOTE — Telephone Encounter (Signed)
PA not needed, form faxed back to pharmacy. 

## 2016-05-01 DIAGNOSIS — Z1231 Encounter for screening mammogram for malignant neoplasm of breast: Secondary | ICD-10-CM | POA: Diagnosis not present

## 2016-05-01 DIAGNOSIS — Z803 Family history of malignant neoplasm of breast: Secondary | ICD-10-CM | POA: Diagnosis not present

## 2016-05-01 LAB — HM MAMMOGRAPHY

## 2016-05-24 DIAGNOSIS — E1151 Type 2 diabetes mellitus with diabetic peripheral angiopathy without gangrene: Secondary | ICD-10-CM | POA: Diagnosis not present

## 2016-05-24 DIAGNOSIS — I739 Peripheral vascular disease, unspecified: Secondary | ICD-10-CM | POA: Diagnosis not present

## 2016-05-24 DIAGNOSIS — L84 Corns and callosities: Secondary | ICD-10-CM | POA: Diagnosis not present

## 2016-05-24 DIAGNOSIS — L603 Nail dystrophy: Secondary | ICD-10-CM | POA: Diagnosis not present

## 2016-07-10 ENCOUNTER — Other Ambulatory Visit: Payer: Self-pay | Admitting: Family Medicine

## 2016-08-08 NOTE — Progress Notes (Signed)
HPI:   [name] is a 71 y.o. female, who is here today for 4-5 months follow up.   She was last seen on 03/01/16.  Hypertension:   Currently on Diovan HCT 320-25 mg daily and Amlodipine 10 mg daily. Last eye exam: 07/2015, has an appt next week.. BP readings at home: Occasionally checked, usually < 140/90.   She is taking medications as instructed, no side effects reported.  Denies headache, visual changes, exertional chest pain, dyspnea,  focal weakness, or edema.   Lab Results  Component Value Date   CREATININE 0.58 03/29/2016   BUN 12 03/29/2016   NA 141 03/29/2016   K 4.3 03/29/2016   CL 106 03/29/2016   CO2 29 03/29/2016   Diabetes Mellitus II:   Currently on Metformin 1000 mg bid,Lantus 60-65 U daily. Recommended Humalog 10 U before meals tid. She cannot afford Trulicity, so has increased dose of Lantus for the past 6-8 week. She has not taken Humalog.  Problem has been poorly controlled. Checking BS's : FG 200-300, post prandial GLU 200's. Hypoglycemia: Denies.  She is tolerating medications well. She denies abdominal pain, nausea, vomiting, polydipsia, polyuria, or polyphagia. Denies feet numbness, tingling, or burning. + Micro albumin in urine 3-4 months ago. She denies foam in urine or gross hematuria.   Lab Results  Component Value Date   HGBA1C 11.4 (H) 03/29/2016   Lab Results  Component Value Date   MICROALBUR 78.5 (H) 03/29/2016   Vit D deficiency, currently she is on Ergocalciferol 50,000 U 2 times per week.  Since her last OV she has changed her diet. She is eating more salads with rancho dressing, baking, increased vegetables. She is eating corn and potatoes. Decreased bread portion.  Exercising: Not regularly.  + Smoker, she has decreased cig/day, 1-2 daily. States that she is not longer buying, smoking her sister's cig.   Review of Systems  Constitutional: Positive for fatigue (No more than usual). Negative for  activity change, appetite change, fever and unexpected weight change.  HENT: Negative for mouth sores, nosebleeds and trouble swallowing.   Eyes: Negative for redness and visual disturbance.  Respiratory: Negative for cough, shortness of breath and wheezing.   Cardiovascular: Negative for chest pain, palpitations and leg swelling.  Gastrointestinal: Negative for abdominal pain, nausea and vomiting.       Negative for changes in bowel habits.  Endocrine: Negative for polydipsia, polyphagia and polyuria.  Genitourinary: Negative for decreased urine volume, dysuria and hematuria.  Musculoskeletal: Negative for gait problem and myalgias.  Skin: Negative for rash and wound.  Neurological: Negative for seizures, syncope, weakness, numbness and headaches.  Psychiatric/Behavioral: Negative for confusion. The patient is nervous/anxious.     Current Outpatient Prescriptions on File Prior to Visit  Medication Sig Dispense Refill  . aspirin EC 81 MG tablet Take 81 mg by mouth daily.    Marland Kitchen atorvastatin (LIPITOR) 20 MG tablet Take 1 tablet (20 mg total) by mouth daily. 90 tablet 1  . Insulin Glargine (LANTUS) 100 UNIT/ML Solostar Pen Use 60 Units daily at the same time. May titrate up if FG >200. 15 mL 3  . Insulin Lispro (HUMALOG KWIKPEN) 200 UNIT/ML SOPN Inject 10 Units into the skin 3 (three) times daily with meals. 15 mL 1  . Insulin Pen Needle 32G X 4 MM MISC Use up to 4x a day 200 each 11  . insulin regular human CONCENTRATED (HUMULIN R U-500 KWIKPEN) 500 UNIT/ML kwikpen Inject 70-85  Units into the skin 3 (three) times daily with meals. 4 pen 0  . metFORMIN (GLUCOPHAGE) 1000 MG tablet Take 1 tablet (1,000 mg total) by mouth 2 (two) times daily with a meal. (Patient taking differently: Take 1,000 mg by mouth 2 (two) times daily with a meal. Only takes once a day) 180 tablet 1  . saccharomyces boulardii (FLORASTOR) 250 MG capsule Take 1 capsule (250 mg total) by mouth 2 (two) times daily. You can get a  probiotic over the counter. 60 capsule 0  . Vitamin D, Ergocalciferol, (DRISDOL) 50000 units CAPS capsule TAKE ONE CAPSULE BY MOUTH TWICE A WEEK ON MONDAY AND THURSDAY 8 capsule 1  . albuterol (PROVENTIL HFA;VENTOLIN HFA) 108 (90 Base) MCG/ACT inhaler Inhale 2 puffs into the lungs every 6 (six) hours as needed for wheezing or shortness of breath. 1 Inhaler 0   No current facility-administered medications on file prior to visit.      Past Medical History:  Diagnosis Date  . Boils   . Glaucoma   . Heart murmur   . HTN (hypertension)   . Hx of adenomatous colonic polyps   . Hyperlipidemia   . Osteoporosis   . Pneumonia   . Type II or unspecified type diabetes mellitus without mention of complication, uncontrolled    No Known Allergies  Social History   Social History  . Marital status: Single    Spouse name: N/A  . Number of children: N/A  . Years of education: N/A   Social History Main Topics  . Smoking status: Current Some Day Smoker  . Smokeless tobacco: Never Used     Comment: smokes occ.   Marland Kitchen Alcohol use 0.0 oz/week     Comment: occ  . Drug use: No  . Sexual activity: Not Asked   Other Topics Concern  . None   Social History Narrative  . None    Vitals:   08/09/16 0823  BP: 134/80  Pulse: 95  Resp: 12  O2 sat at RA 96% Body mass index is 28.61 kg/m.   Physical Exam  Nursing note and vitals reviewed. Constitutional: She is oriented to person, place, and time. She appears well-developed. No distress.  HENT:  Head: Normocephalic and atraumatic.  Mouth/Throat: Oropharynx is clear and moist and mucous membranes are normal.  Eyes: Pupils are equal, round, and reactive to light. Conjunctivae and EOM are normal.  Cardiovascular: Normal rate and regular rhythm.   No murmur heard. Pulses:      Dorsalis pedis pulses are 2+ on the right side, and 2+ on the left side.  I do not hear a murmur today.  Respiratory: Effort normal and breath sounds normal. No  respiratory distress.  GI: Soft. She exhibits no mass. There is no hepatomegaly. There is no tenderness.  Musculoskeletal: She exhibits edema (Trace pitting edema LE, bilateral.). She exhibits no tenderness.  Lymphadenopathy:    She has no cervical adenopathy.  Neurological: She is alert and oriented to person, place, and time. She has normal strength. Coordination and gait normal.  Skin: Skin is warm. No erythema.  Psychiatric: She has a normal mood and affect.  Well groomed, good eye contact.    Foot exam on 03/01/2016.   ASSESSMENT AND PLAN:   [name] was seen today for 4-5 months follow-up.  Diagnoses and all orders for this visit:  Lab Results  Component Value Date   HGBA1C 15.4 (H) 08/09/2016   Lab Results  Component Value Date  MICROALBUR 82.3 (H) 08/09/2016   Lab Results  Component Value Date   CREATININE 0.55 08/09/2016   BUN 14 08/09/2016   NA 140 08/09/2016   K 4.1 08/09/2016   CL 106 08/09/2016   CO2 28 08/09/2016    Uncontrolled type 2 diabetes mellitus with insulin therapy ([place])  HgA1C has not been at goal. No changes in current management for now, we will adjust medications according to lab results. Educated about the importance of compliance with medications as well as complications of poorly controlled diabetes. Regular exercise and healthy diet with avoidance of added sugar food intake is an important part of treatment and recommended. Annual eye exam, periodic dental and foot care recommended. F/U in 4 months  -     Basic metabolic panel -     Hemoglobin A1c -     Fructosamine -     Microalbumin / creatinine urine ratio  Vitamin D deficiency  No changes in current management, will follow labs done today and will give further recommendations accordingly.  -     VITAMIN D 25 Hydroxy (Vit-D Deficiency, Fractures)  Essential hypertension  Adequately controlled. No changes in current management. DASH-low salt diet  recommended. Eye exam recommended annually. F/U in 6 months, before if needed.  -     Basic metabolic panel -     valsartan-hydrochlorothiazide (DIOVAN-HCT) 320-25 MG tablet; Take 1 tablet by mouth daily. -     amLODipine (NORVASC) 10 MG tablet; Take 1 tablet (10 mg total) by mouth daily.  Microalbuminuria  We discussed treatment of complications of diabetes, importance of adequately controlling BS's. Continue Valsartan. Further recommendations will be given according to lab results.  -     Microalbumin / creatinine urine ratio  Need for zoster vaccination  She has not had Zoster vaccination, Rx given.  -     Zoster Vac Recomb Adjuvanted [facility]) injection; Inject 0.5 mLs into the muscle every 3 (three) months.     -[name] was advised to return sooner than planned today if new concerns arise.     Sarah G. [name], MD  [facility]. [place] office.

## 2016-08-09 ENCOUNTER — Ambulatory Visit (INDEPENDENT_AMBULATORY_CARE_PROVIDER_SITE_OTHER): Payer: Medicare Other | Admitting: Family Medicine

## 2016-08-09 ENCOUNTER — Encounter: Payer: Self-pay | Admitting: Family Medicine

## 2016-08-09 VITALS — BP 134/80 | HR 95 | Resp 12 | Ht 60.0 in | Wt 146.5 lb

## 2016-08-09 DIAGNOSIS — E559 Vitamin D deficiency, unspecified: Secondary | ICD-10-CM | POA: Diagnosis not present

## 2016-08-09 DIAGNOSIS — E1165 Type 2 diabetes mellitus with hyperglycemia: Secondary | ICD-10-CM

## 2016-08-09 DIAGNOSIS — Z794 Long term (current) use of insulin: Secondary | ICD-10-CM

## 2016-08-09 DIAGNOSIS — Z23 Encounter for immunization: Secondary | ICD-10-CM | POA: Diagnosis not present

## 2016-08-09 DIAGNOSIS — IMO0002 Reserved for concepts with insufficient information to code with codable children: Secondary | ICD-10-CM

## 2016-08-09 DIAGNOSIS — R809 Proteinuria, unspecified: Secondary | ICD-10-CM | POA: Diagnosis not present

## 2016-08-09 DIAGNOSIS — I10 Essential (primary) hypertension: Secondary | ICD-10-CM

## 2016-08-09 LAB — VITAMIN D 25 HYDROXY (VIT D DEFICIENCY, FRACTURES): VITD: 24.38 ng/mL — ABNORMAL LOW (ref 30.00–100.00)

## 2016-08-09 LAB — MICROALBUMIN / CREATININE URINE RATIO
Creatinine,U: 142.1 mg/dL
Microalb Creat Ratio: 57.9 mg/g — ABNORMAL HIGH (ref 0.0–30.0)
Microalb, Ur: 82.3 mg/dL — ABNORMAL HIGH (ref 0.0–1.9)

## 2016-08-09 LAB — BASIC METABOLIC PANEL
BUN: 14 mg/dL (ref 6–23)
CO2: 28 mEq/L (ref 19–32)
Calcium: 9.5 mg/dL (ref 8.4–10.5)
Chloride: 106 mEq/L (ref 96–112)
Creatinine, Ser: 0.55 mg/dL (ref 0.40–1.20)
GFR: 140.93 mL/min (ref >60.00)
Glucose, Bld: 271 mg/dL — ABNORMAL HIGH (ref 70–99)
Potassium: 4.1 mEq/L (ref 3.5–5.1)
Sodium: 140 mEq/L (ref 135–145)

## 2016-08-09 LAB — HEMOGLOBIN A1C: Hgb A1c MFr Bld: 15.4 % — ABNORMAL HIGH (ref 4.6–6.5)

## 2016-08-09 MED ORDER — ZOSTER VAC RECOMB ADJUVANTED 50 MCG/0.5ML IM SUSR: 0.5000 mL | INTRAMUSCULAR | 1 refills | Status: DC

## 2016-08-09 NOTE — Patient Instructions (Addendum)
A few things to remember from today's visit:   Uncontrolled type 2 diabetes mellitus with insulin therapy ([place]) - Plan: Basic metabolic panel, Hemoglobin A1c, Fructosamine, Microalbumin / creatinine urine ratio  Vitamin D deficiency - Plan: VITAMIN D 25 Hydroxy (Vit-D Deficiency, Fractures)  Essential hypertension - Plan: Basic metabolic panel  Microalbuminuria - Plan: Microalbumin / creatinine urine ratio  Need for zoster vaccination - Plan: Zoster Vac Recomb Adjuvanted (SHINGRIX) injection  HgA1C goal < 7.0. Avoid sugar added food:regular soft drinks, energy drinks, and sports drinks. candy. cakes. cookies. pies and cobblers. sweet rolls, pastries, and donuts. fruit drinks, such as fruitades and fruit punch. dairy desserts, such as ice cream  Mediterranean diet has showed benefits for sugar control.  How much and what type of carbohydrate foods are important for managing diabetes. The balance between how much insulin is in your body and the carbohydrate you eat makes a difference in your blood glucose levels.  Fasting blood sugar ideally 130 or less, 2 hours after meals less than 180.   Regular exercise also will help with controlling disease, daily brisk walking as tolerated for 15-30 min definitively will help.   Avoid skipping meals, blood sugar might drop and cause serious problems. Remember checking feet periodically, good dental hygiene, and annual eye exam.     Please be sure medication list is accurate. If a new problem present, please set up appointment sooner than planned today.

## 2016-08-10 MED ORDER — AMLODIPINE BESYLATE 10 MG PO TABS: 10.0000 mg | ORAL_TABLET | Freq: Every day | ORAL | 2 refills | Status: DC

## 2016-08-10 MED ORDER — VALSARTAN-HYDROCHLOROTHIAZIDE 320-25 MG PO TABS: 1.0000 | ORAL_TABLET | Freq: Every day | ORAL | 2 refills | Status: DC

## 2016-08-11 LAB — FRUCTOSAMINE: Fructosamine: 482 umol/L — ABNORMAL HIGH (ref 190–270)

## 2016-09-07 DIAGNOSIS — L84 Corns and callosities: Secondary | ICD-10-CM | POA: Diagnosis not present

## 2016-09-07 DIAGNOSIS — M2042 Other hammer toe(s) (acquired), left foot: Secondary | ICD-10-CM | POA: Diagnosis not present

## 2016-09-07 DIAGNOSIS — E1051 Type 1 diabetes mellitus with diabetic peripheral angiopathy without gangrene: Secondary | ICD-10-CM | POA: Diagnosis not present

## 2016-09-07 DIAGNOSIS — M2041 Other hammer toe(s) (acquired), right foot: Secondary | ICD-10-CM | POA: Diagnosis not present

## 2016-09-07 DIAGNOSIS — I739 Peripheral vascular disease, unspecified: Secondary | ICD-10-CM | POA: Diagnosis not present

## 2016-09-09 DIAGNOSIS — H25813 Combined forms of age-related cataract, bilateral: Secondary | ICD-10-CM | POA: Diagnosis not present

## 2016-09-09 DIAGNOSIS — E119 Type 2 diabetes mellitus without complications: Secondary | ICD-10-CM | POA: Diagnosis not present

## 2016-09-09 DIAGNOSIS — H43812 Vitreous degeneration, left eye: Secondary | ICD-10-CM | POA: Diagnosis not present

## 2016-09-09 DIAGNOSIS — H40053 Ocular hypertension, bilateral: Secondary | ICD-10-CM | POA: Diagnosis not present

## 2016-09-30 ENCOUNTER — Encounter: Payer: Self-pay | Admitting: Family Medicine

## 2016-10-06 DIAGNOSIS — H401131 Primary open-angle glaucoma, bilateral, mild stage: Secondary | ICD-10-CM | POA: Diagnosis not present

## 2016-10-06 DIAGNOSIS — H25811 Combined forms of age-related cataract, right eye: Secondary | ICD-10-CM | POA: Diagnosis not present

## 2016-10-06 DIAGNOSIS — H25812 Combined forms of age-related cataract, left eye: Secondary | ICD-10-CM | POA: Diagnosis not present

## 2016-10-15 ENCOUNTER — Inpatient Hospital Stay (HOSPITAL_COMMUNITY)
Admission: EM | Admit: 2016-10-15 | Discharge: 2016-10-19 | DRG: 041 | Disposition: A | Discharge status: Rehabilitation Facility | Payer: Medicare Other | Attending: Family Medicine | Admitting: Family Medicine

## 2016-10-15 ENCOUNTER — Encounter (HOSPITAL_COMMUNITY): Payer: Self-pay | Admitting: Emergency Medicine

## 2016-10-15 ENCOUNTER — Emergency Department (HOSPITAL_COMMUNITY): Payer: Medicare Other

## 2016-10-15 DIAGNOSIS — G8194 Hemiplegia, unspecified affecting left nondominant side: Secondary | ICD-10-CM | POA: Diagnosis not present

## 2016-10-15 DIAGNOSIS — I6300 Cerebral infarction due to thrombosis of unspecified precerebral artery: Secondary | ICD-10-CM | POA: Diagnosis not present

## 2016-10-15 DIAGNOSIS — I10 Essential (primary) hypertension: Secondary | ICD-10-CM | POA: Diagnosis not present

## 2016-10-15 DIAGNOSIS — Z7982 Long term (current) use of aspirin: Secondary | ICD-10-CM | POA: Diagnosis not present

## 2016-10-15 DIAGNOSIS — I5030 Unspecified diastolic (congestive) heart failure: Secondary | ICD-10-CM | POA: Diagnosis present

## 2016-10-15 DIAGNOSIS — E876 Hypokalemia: Secondary | ICD-10-CM | POA: Diagnosis present

## 2016-10-15 DIAGNOSIS — R011 Cardiac murmur, unspecified: Secondary | ICD-10-CM | POA: Diagnosis present

## 2016-10-15 DIAGNOSIS — Z79899 Other long term (current) drug therapy: Secondary | ICD-10-CM | POA: Diagnosis not present

## 2016-10-15 DIAGNOSIS — I6789 Other cerebrovascular disease: Secondary | ICD-10-CM | POA: Diagnosis not present

## 2016-10-15 DIAGNOSIS — Z720 Tobacco use: Secondary | ICD-10-CM

## 2016-10-15 DIAGNOSIS — I639 Cerebral infarction, unspecified: Secondary | ICD-10-CM | POA: Diagnosis not present

## 2016-10-15 DIAGNOSIS — D62 Acute posthemorrhagic anemia: Secondary | ICD-10-CM

## 2016-10-15 DIAGNOSIS — E785 Hyperlipidemia, unspecified: Secondary | ICD-10-CM | POA: Diagnosis not present

## 2016-10-15 DIAGNOSIS — Z23 Encounter for immunization: Secondary | ICD-10-CM | POA: Diagnosis not present

## 2016-10-15 DIAGNOSIS — E1121 Type 2 diabetes mellitus with diabetic nephropathy: Secondary | ICD-10-CM

## 2016-10-15 DIAGNOSIS — I63411 Cerebral infarction due to embolism of right middle cerebral artery: Secondary | ICD-10-CM | POA: Diagnosis not present

## 2016-10-15 DIAGNOSIS — M6281 Muscle weakness (generalized): Secondary | ICD-10-CM | POA: Diagnosis not present

## 2016-10-15 DIAGNOSIS — H409 Unspecified glaucoma: Secondary | ICD-10-CM | POA: Diagnosis not present

## 2016-10-15 DIAGNOSIS — E1142 Type 2 diabetes mellitus with diabetic polyneuropathy: Secondary | ICD-10-CM | POA: Diagnosis not present

## 2016-10-15 DIAGNOSIS — F17210 Nicotine dependence, cigarettes, uncomplicated: Secondary | ICD-10-CM | POA: Diagnosis present

## 2016-10-15 DIAGNOSIS — R29702 NIHSS score 2: Secondary | ICD-10-CM | POA: Diagnosis present

## 2016-10-15 DIAGNOSIS — R471 Dysarthria and anarthria: Secondary | ICD-10-CM | POA: Diagnosis present

## 2016-10-15 DIAGNOSIS — J449 Chronic obstructive pulmonary disease, unspecified: Secondary | ICD-10-CM | POA: Diagnosis not present

## 2016-10-15 DIAGNOSIS — R29810 Facial weakness: Secondary | ICD-10-CM | POA: Diagnosis not present

## 2016-10-15 DIAGNOSIS — R29704 NIHSS score 4: Secondary | ICD-10-CM | POA: Diagnosis not present

## 2016-10-15 DIAGNOSIS — Z90710 Acquired absence of both cervix and uterus: Secondary | ICD-10-CM

## 2016-10-15 DIAGNOSIS — I63311 Cerebral infarction due to thrombosis of right middle cerebral artery: Secondary | ICD-10-CM | POA: Diagnosis not present

## 2016-10-15 DIAGNOSIS — E1165 Type 2 diabetes mellitus with hyperglycemia: Secondary | ICD-10-CM | POA: Diagnosis not present

## 2016-10-15 DIAGNOSIS — Z716 Tobacco abuse counseling: Secondary | ICD-10-CM | POA: Diagnosis not present

## 2016-10-15 DIAGNOSIS — Z823 Family history of stroke: Secondary | ICD-10-CM

## 2016-10-15 DIAGNOSIS — R4781 Slurred speech: Secondary | ICD-10-CM | POA: Diagnosis present

## 2016-10-15 DIAGNOSIS — Z6827 Body mass index (BMI) 27.0-27.9, adult: Secondary | ICD-10-CM

## 2016-10-15 DIAGNOSIS — I519 Heart disease, unspecified: Secondary | ICD-10-CM | POA: Diagnosis not present

## 2016-10-15 DIAGNOSIS — E663 Overweight: Secondary | ICD-10-CM | POA: Diagnosis present

## 2016-10-15 DIAGNOSIS — K047 Periapical abscess without sinus: Secondary | ICD-10-CM | POA: Diagnosis not present

## 2016-10-15 DIAGNOSIS — M810 Age-related osteoporosis without current pathological fracture: Secondary | ICD-10-CM | POA: Diagnosis not present

## 2016-10-15 DIAGNOSIS — I6389 Other cerebral infarction: Secondary | ICD-10-CM | POA: Diagnosis not present

## 2016-10-15 DIAGNOSIS — I69354 Hemiplegia and hemiparesis following cerebral infarction affecting left non-dominant side: Secondary | ICD-10-CM | POA: Diagnosis not present

## 2016-10-15 DIAGNOSIS — Z794 Long term (current) use of insulin: Secondary | ICD-10-CM

## 2016-10-15 DIAGNOSIS — Z833 Family history of diabetes mellitus: Secondary | ICD-10-CM

## 2016-10-15 DIAGNOSIS — I5189 Other ill-defined heart diseases: Secondary | ICD-10-CM

## 2016-10-15 DIAGNOSIS — R29898 Other symptoms and signs involving the musculoskeletal system: Secondary | ICD-10-CM

## 2016-10-15 DIAGNOSIS — I119 Hypertensive heart disease without heart failure: Secondary | ICD-10-CM

## 2016-10-15 DIAGNOSIS — Z86010 Personal history of colonic polyps: Secondary | ICD-10-CM

## 2016-10-15 DIAGNOSIS — I6521 Occlusion and stenosis of right carotid artery: Secondary | ICD-10-CM | POA: Diagnosis not present

## 2016-10-15 DIAGNOSIS — I700 Atherosclerosis of aorta: Secondary | ICD-10-CM | POA: Diagnosis present

## 2016-10-15 DIAGNOSIS — I63511 Cerebral infarction due to unspecified occlusion or stenosis of right middle cerebral artery: Secondary | ICD-10-CM | POA: Diagnosis not present

## 2016-10-15 DIAGNOSIS — Z8249 Family history of ischemic heart disease and other diseases of the circulatory system: Secondary | ICD-10-CM | POA: Diagnosis not present

## 2016-10-15 DIAGNOSIS — I663 Occlusion and stenosis of cerebellar arteries: Secondary | ICD-10-CM | POA: Diagnosis not present

## 2016-10-15 DIAGNOSIS — I110 Hypertensive heart disease with heart failure: Secondary | ICD-10-CM | POA: Diagnosis present

## 2016-10-15 LAB — COMPREHENSIVE METABOLIC PANEL
ALT: 11 U/L — ABNORMAL LOW (ref 14–54)
AST: 14 U/L — ABNORMAL LOW (ref 15–41)
Albumin: 3.5 g/dL (ref 3.5–5.0)
Alkaline Phosphatase: 93 U/L (ref 38–126)
Anion gap: 10 (ref 5–15)
BUN: 13 mg/dL (ref 6–20)
CO2: 24 mmol/L (ref 22–32)
Calcium: 9.7 mg/dL (ref 8.9–10.3)
Chloride: 104 mmol/L (ref 101–111)
Creatinine, Ser: 0.69 mg/dL (ref 0.44–1.00)
GFR calc Af Amer: >60 mL/min (ref >60)
GFR calc non Af Amer: >60 mL/min (ref >60)
Glucose, Bld: 335 mg/dL — ABNORMAL HIGH (ref 65–99)
Potassium: 4 mmol/L (ref 3.5–5.1)
Sodium: 138 mmol/L (ref 135–145)
Total Bilirubin: 0.4 mg/dL (ref 0.3–1.2)
Total Protein: 7 g/dL (ref 6.5–8.1)

## 2016-10-15 LAB — I-STAT TROPONIN, ED: Troponin i, poc: 0.01 ng/mL (ref 0.00–0.08)

## 2016-10-15 LAB — I-STAT CHEM 8, ED
BUN: 16 mg/dL (ref 6–20)
Calcium, Ion: 1.17 mmol/L (ref 1.15–1.40)
Chloride: 103 mmol/L (ref 101–111)
Creatinine, Ser: 0.5 mg/dL (ref 0.44–1.00)
Glucose, Bld: 333 mg/dL — ABNORMAL HIGH (ref 65–99)
HCT: 42 % (ref 36.0–46.0)
Hemoglobin: 14.3 g/dL (ref 12.0–15.0)
Potassium: 3.8 mmol/L (ref 3.5–5.1)
Sodium: 139 mmol/L (ref 135–145)
TCO2: 26 mmol/L (ref 22–32)

## 2016-10-15 LAB — DIFFERENTIAL
Basophils Absolute: 0 10*3/uL (ref 0.0–0.1)
Basophils Relative: 0 %
Eosinophils Absolute: 0.1 10*3/uL (ref 0.0–0.7)
Eosinophils Relative: 1 %
Lymphocytes Relative: 31 %
Lymphs Abs: 3.1 10*3/uL (ref 0.7–4.0)
Monocytes Absolute: 0.5 10*3/uL (ref 0.1–1.0)
Monocytes Relative: 5 %
Neutro Abs: 6.4 10*3/uL (ref 1.7–7.7)
Neutrophils Relative %: 63 %

## 2016-10-15 LAB — CBC
HCT: 39.8 % (ref 36.0–46.0)
Hemoglobin: 13.2 g/dL (ref 12.0–15.0)
MCH: 30.8 pg (ref 26.0–34.0)
MCHC: 33.2 g/dL (ref 30.0–36.0)
MCV: 92.8 fL (ref 78.0–100.0)
Platelets: 363 10*3/uL (ref 150–400)
RBC: 4.29 MIL/uL (ref 3.87–5.11)
RDW: 12.5 % (ref 11.5–15.5)
WBC: 10.1 10*3/uL (ref 4.0–10.5)

## 2016-10-15 LAB — URINALYSIS, ROUTINE W REFLEX MICROSCOPIC
Bilirubin Urine: NEGATIVE
Glucose, UA: >=500 mg/dL — AB
Hgb urine dipstick: NEGATIVE
Ketones, ur: 5 mg/dL — AB
Leukocytes, UA: NEGATIVE
Nitrite: NEGATIVE
Protein, ur: 100 mg/dL — AB
Specific Gravity, Urine: 1.03 (ref 1.005–1.030)
pH: 5 (ref 5.0–8.0)

## 2016-10-15 LAB — RAPID URINE DRUG SCREEN, HOSP PERFORMED
Amphetamines: NOT DETECTED
Barbiturates: NOT DETECTED
Benzodiazepines: NOT DETECTED
Cocaine: NOT DETECTED
Opiates: NOT DETECTED
Tetrahydrocannabinol: NOT DETECTED

## 2016-10-15 LAB — CBG MONITORING, ED: Glucose-Capillary: 308 mg/dL — ABNORMAL HIGH (ref 65–99)

## 2016-10-15 LAB — APTT: aPTT: 29 seconds (ref 24–36)

## 2016-10-15 LAB — PROTIME-INR
INR: 0.95
Prothrombin Time: 12.6 seconds (ref 11.4–15.2)

## 2016-10-15 LAB — ETHANOL: Alcohol, Ethyl (B): <10 mg/dL (ref <10)

## 2016-10-15 MED ORDER — METFORMIN HCL 500 MG PO TABS
1000.0000 mg | ORAL_TABLET | Freq: Two times a day (BID) | ORAL | Status: DC
  Administered 2016-10-16 – 2016-10-19 (×6): 1000 mg via ORAL
  Filled 2016-10-15 (×7): qty 2

## 2016-10-15 MED ORDER — LATANOPROST 0.005 % OP SOLN
1.0000 [drp] | Freq: Every day | OPHTHALMIC | Status: DC
  Administered 2016-10-16 – 2016-10-18 (×4): 1 [drp] via OPHTHALMIC
  Filled 2016-10-15: qty 2.5

## 2016-10-15 MED ORDER — ATORVASTATIN CALCIUM 10 MG PO TABS
20.0000 mg | ORAL_TABLET | Freq: Every day | ORAL | Status: DC
  Administered 2016-10-16: 20 mg via ORAL
  Filled 2016-10-15: qty 2

## 2016-10-15 MED ORDER — IRBESARTAN 300 MG PO TABS
300.0000 mg | ORAL_TABLET | Freq: Every day | ORAL | Status: DC
  Administered 2016-10-16 – 2016-10-18 (×3): 300 mg via ORAL
  Filled 2016-10-15 (×3): qty 1

## 2016-10-15 MED ORDER — ACETAMINOPHEN 325 MG PO TABS
650.0000 mg | ORAL_TABLET | ORAL | Status: DC | PRN
  Administered 2016-10-17: 650 mg via ORAL
  Filled 2016-10-15: qty 2

## 2016-10-15 MED ORDER — SODIUM CHLORIDE 0.9 % IV SOLN: INTRAVENOUS | Status: AC

## 2016-10-15 MED ORDER — METOPROLOL TARTRATE 12.5 MG HALF TABLET
12.5000 mg | ORAL_TABLET | Freq: Once | ORAL | Status: AC
  Administered 2016-10-16: 12.5 mg via ORAL
  Filled 2016-10-15: qty 1

## 2016-10-15 MED ORDER — INSULIN ASPART 100 UNIT/ML ~~LOC~~ SOLN
0.0000 [IU] | Freq: Every day | SUBCUTANEOUS | Status: DC
  Administered 2016-10-16: 4 [IU] via SUBCUTANEOUS
  Administered 2016-10-17: 2 [IU] via SUBCUTANEOUS

## 2016-10-15 MED ORDER — INSULIN GLARGINE 100 UNIT/ML ~~LOC~~ SOLN
60.0000 [IU] | Freq: Every day | SUBCUTANEOUS | Status: DC
  Administered 2016-10-16 – 2016-10-18 (×4): 60 [IU] via SUBCUTANEOUS
  Filled 2016-10-15 (×4): qty 0.6

## 2016-10-15 MED ORDER — ACETAMINOPHEN 160 MG/5ML PO SOLN: 650.0000 mg | ORAL | Status: DC | PRN

## 2016-10-15 MED ORDER — VALSARTAN-HYDROCHLOROTHIAZIDE 320-25 MG PO TABS: 1.0000 | ORAL_TABLET | Freq: Every day | ORAL | Status: DC

## 2016-10-15 MED ORDER — INSULIN ASPART 100 UNIT/ML ~~LOC~~ SOLN
10.0000 [IU] | Freq: Three times a day (TID) | SUBCUTANEOUS | Status: DC
  Administered 2016-10-16 (×2): 10 [IU] via SUBCUTANEOUS

## 2016-10-15 MED ORDER — STROKE: EARLY STAGES OF RECOVERY BOOK
Freq: Once | Status: AC
  Administered 2016-10-16
  Filled 2016-10-15: qty 1

## 2016-10-15 MED ORDER — ALBUTEROL SULFATE (2.5 MG/3ML) 0.083% IN NEBU: 3.0000 mL | INHALATION_SOLUTION | Freq: Four times a day (QID) | RESPIRATORY_TRACT | Status: DC | PRN

## 2016-10-15 MED ORDER — VITAMIN D (ERGOCALCIFEROL) 1.25 MG (50000 UNIT) PO CAPS
50000.0000 [IU] | ORAL_CAPSULE | ORAL | Status: DC
  Administered 2016-10-18: 50000 [IU] via ORAL
  Filled 2016-10-15: qty 1

## 2016-10-15 MED ORDER — ASPIRIN EC 325 MG PO TBEC
325.0000 mg | DELAYED_RELEASE_TABLET | Freq: Every day | ORAL | Status: DC
  Administered 2016-10-16 – 2016-10-19 (×4): 325 mg via ORAL
  Filled 2016-10-15 (×4): qty 1

## 2016-10-15 MED ORDER — CLOPIDOGREL BISULFATE 75 MG PO TABS
75.0000 mg | ORAL_TABLET | Freq: Every day | ORAL | Status: DC
  Administered 2016-10-16 – 2016-10-19 (×4): 75 mg via ORAL
  Filled 2016-10-15 (×4): qty 1

## 2016-10-15 MED ORDER — ACETAMINOPHEN 650 MG RE SUPP: 650.0000 mg | RECTAL | Status: DC | PRN

## 2016-10-15 MED ORDER — HYDROCHLOROTHIAZIDE 25 MG PO TABS
25.0000 mg | ORAL_TABLET | Freq: Every day | ORAL | Status: DC
  Administered 2016-10-16 – 2016-10-18 (×3): 25 mg via ORAL
  Filled 2016-10-15 (×3): qty 1

## 2016-10-15 MED ORDER — SACCHAROMYCES BOULARDII 250 MG PO CAPS
250.0000 mg | ORAL_CAPSULE | Freq: Two times a day (BID) | ORAL | Status: DC
Start: 2016-10-16 — End: 2016-10-19
  Administered 2016-10-16 – 2016-10-19 (×8): 250 mg via ORAL
  Filled 2016-10-15 (×8): qty 1

## 2016-10-15 MED ORDER — INSULIN ASPART 100 UNIT/ML ~~LOC~~ SOLN
0.0000 [IU] | Freq: Three times a day (TID) | SUBCUTANEOUS | Status: DC
  Administered 2016-10-16 (×2): 2 [IU] via SUBCUTANEOUS
  Administered 2016-10-17 – 2016-10-18 (×2): 1 [IU] via SUBCUTANEOUS
  Administered 2016-10-18: 3 [IU] via SUBCUTANEOUS

## 2016-10-15 MED ORDER — AMLODIPINE BESYLATE 10 MG PO TABS
10.0000 mg | ORAL_TABLET | Freq: Every day | ORAL | Status: DC
  Administered 2016-10-16 – 2016-10-18 (×2): 10 mg via ORAL
  Filled 2016-10-15 (×3): qty 1

## 2016-10-15 NOTE — Progress Notes (Signed)
Received report from [place], [place] in the ED. Pt Sarah [name] coming in with L sided facial droop and L weakness to her arm/hand. Reportedly patient went to dentist today and was diagnosed with 2 abcessess and given amoxicillin.  No numbing agents used. Passed swallow eval. Patient is diabetic. CBG is in the 300s. Will await arrival.

## 2016-10-15 NOTE — ED Notes (Signed)
Patient transported to CT 

## 2016-10-15 NOTE — H&P (Signed)
TRH H&P   Patient Demographics:    Delise Simenson, is a 71 y.o. female  MRN: [id number]   DOB - [birth year]  Admit Date - 10/15/2016  Outpatient Primary MD for the patient is [name], [name], MD  Referring MD/NP/PA: Flasher Cellar  Outpatient Specialists:   Patient coming from: home  Chief Complaint  Patient presents with  . Numbness      HPI:    Shontae Rosiles  is a 71 y.o. female, w hypertension, hyperlipidemia, dm2, w c/o left arm/hand weakness since yesterday am. Slight numbness, tingling.  Pt denies headache, vision change, cp, palp, sob, n/v.  Pt may have slight facial droop as well as slight dysarthria per family. pt was brought to ED for r/o CVA.   In ED, CT brain => focal white matter hypodensity in the posterior right frontal deep white matter new since 03/04/2007, potentially representing a subacute white matter infarct.    Wbc 10.1, Hgb 13.2, Plt 363, Glucose 335.  Pt will be admitted for r/o CVA.        Review of systems:    In addition to the HPI above,  No Fever-chills, No Headache, No changes with Vision or hearing, No problems swallowing food or Liquids, No Chest pain, Cough or Shortness of Breath, No Abdominal pain, No Nausea or Vommitting, Bowel movements are regular, No Blood in stool or Urine, No dysuria, No new skin rashes or bruises, No new joints pains-aches,   No recent weight gain or loss, No polyuria, polydypsia or polyphagia, No significant Mental Stressors.  A full 10 point Review of Systems was done, except as stated above, all other Review of Systems were negative.   With Past History of the following :    Past Medical History:  Diagnosis Date  . Boils   . Glaucoma   . Heart murmur   . HTN (hypertension)   . Hx of adenomatous colonic polyps   . Hyperlipidemia   . Osteoporosis   . Pneumonia   . Type II or  unspecified type diabetes mellitus without mention of complication, uncontrolled       Past Surgical History:  Procedure Laterality Date  . ABDOMINAL HYSTERECTOMY    . CARPAL TUNNEL RELEASE     right  . COLONOSCOPY  12-10-10   per Dr. Deatra Ina, clear, repeat in 7 yrs   . INCISION AND DRAINAGE PERIRECTAL ABSCESS N/A 09/26/2015   Procedure: IRRIGATION AND DEBRIDEMENT PERIRECTAL ABSCESS;  Surgeon: Mickeal Skinner, MD;  Location: [place];  Service: General;  Laterality: N/A;  . KNEE ARTHROSCOPY     right  . ORIF ANKLE FRACTURE Right 03/24/2012   Procedure: OPEN REDUCTION INTERNAL FIXATION (ORIF) ANKLE FRACTURE;  Surgeon: Newt Minion, MD;  Location: [place];  Service: Orthopedics;  Laterality: Right;  Open Reduction Internal Fixation Right Bimalleolar ankle fracture  . POLYPECTOMY    .  TONSILLECTOMY        Social History:     Social History  Substance Use Topics  . Smoking status: Current Some Day Smoker  . Smokeless tobacco: Never Used     Comment: smokes occ.   Marland Kitchen Alcohol use 0.0 oz/week     Comment: occ     Lives - at home  Mobility - walks at baseline   Family History :     Family History  Problem Relation Age of Onset  . Diabetes Mother   . Hypertension Mother   . Colon cancer Neg Hx   . Esophageal cancer Neg Hx   . Rectal cancer Neg Hx   . Stomach cancer Neg Hx       Home Medications:   Prior to Admission medications   Medication Sig Start Date End Date Taking? Authorizing Provider  albuterol (PROVENTIL HFA;VENTOLIN HFA) 108 (90 Base) MCG/ACT inhaler Inhale 2 puffs into the lungs every 6 (six) hours as needed for wheezing or shortness of breath. 02/05/16 03/06/16  [name], [name], MD  amLODipine (NORVASC) 10 MG tablet Take 1 tablet (10 mg total) by mouth daily. 08/10/16   [name], [name], MD  aspirin EC 81 MG tablet Take 81 mg by mouth daily.    [provider]  atorvastatin (LIPITOR) 20 MG tablet Take 1 tablet (20 mg total) by mouth daily. 03/15/14   Shawna Orleans,  Doe-Hyun R, DO  Insulin Glargine (LANTUS) 100 UNIT/ML Solostar Pen Use 60 Units daily at the same time. May titrate up if FG >200. 10/28/15   [name], [name], MD  Insulin Lispro (HUMALOG KWIKPEN) 200 UNIT/ML SOPN Inject 10 Units into the skin 3 (three) times daily with meals. 04/08/16   [name], [name], MD  Insulin Pen Needle 32G X 4 MM MISC Use up to 4x a day 12/10/14   Philemon Kingdom, MD  insulin regular human CONCENTRATED (HUMULIN R U-500 KWIKPEN) 500 UNIT/ML kwikpen Inject 70-85 Units into the skin 3 (three) times daily with meals. 07/23/15   [name], [name], MD  metFORMIN (GLUCOPHAGE) 1000 MG tablet Take 1 tablet (1,000 mg total) by mouth 2 (two) times daily with a meal. Patient taking differently: Take 1,000 mg by mouth 2 (two) times daily with a meal. Only takes once a day 06/05/14   Philemon Kingdom, MD  saccharomyces boulardii (FLORASTOR) 250 MG capsule Take 1 capsule (250 mg total) by mouth 2 (two) times daily. You can get a probiotic over the counter. 09/29/15   Meuth, Brooke A, PA-C  valsartan-hydrochlorothiazide (DIOVAN-HCT) 320-25 MG tablet Take 1 tablet by mouth daily. 08/10/16   [name], [name], MD  Vitamin D, Ergocalciferol, (DRISDOL) 50000 units CAPS capsule TAKE ONE CAPSULE BY MOUTH TWICE A WEEK ON MONDAY AND THURSDAY 07/12/16   [name], [name], MD  Zoster Vac Recomb Adjuvanted [facility]) injection Inject 0.5 mLs into the muscle every 3 (three) months. 08/09/16 11/08/16  [name], [name], MD     Allergies:    No Known Allergies   Physical Exam:   Vitals  Blood pressure (!) 185/76, pulse 76, temperature 98.4 F (36.9 C), resp. rate 14, height 5' (1.524 m), weight 63.5 kg (140 lb), SpO2 98 %.   1. General lying in bed in NAD,    2. Normal affect and insight, Not Suicidal or Homicidal, Awake Alert, Oriented X 3.  3. No F.N deficits, ALL C.Nerves Intact, Strength 5/5 all 4 extremities, Sensation intact all 4 extremities, Plantars down going. Slight right facial  droop, slight  dysarthria  4. Ears and Eyes appear Normal, Conjunctivae clear, PERRLA. Moist Oral Mucosa.  5. Supple Neck, No JVD, No cervical lymphadenopathy appriciated, No Carotid Bruits.  6. Symmetrical Chest wall movement, Good air movement bilaterally, CTAB.  7. RRR, No Gallops, Rubs or Murmurs, No Parasternal Heave.  8. Positive Bowel Sounds, Abdomen Soft, No tenderness, No organomegaly appriciated,No rebound -guarding or rigidity.  9.  No Cyanosis, Normal Skin Turgor, No Skin Rash or Bruise.  10. Good muscle tone,  joints appear normal , no effusions, Normal ROM.  11. No Palpable Lymph Nodes in Neck or Axillae  Slight pronator drift with left hand.    Data Review:    CBC  Recent Labs Lab 10/15/16 2033 10/15/16 2042  WBC 10.1  --   HGB 13.2 14.3  HCT 39.8 42.0  PLT 363  --   MCV 92.8  --   MCH 30.8  --   MCHC 33.2  --   RDW 12.5  --   LYMPHSABS 3.1  --   MONOABS 0.5  --   EOSABS 0.1  --   BASOSABS 0.0  --    ------------------------------------------------------------------------------------------------------------------  Chemistries   Recent Labs Lab 10/15/16 2033 10/15/16 2042  NA 138 139  K 4.0 3.8  CL 104 103  CO2 24  --   GLUCOSE 335* 333*  BUN 13 16  CREATININE 0.69 0.50  CALCIUM 9.7  --   AST 14*  --   ALT 11*  --   ALKPHOS 93  --   BILITOT 0.4  --    ------------------------------------------------------------------------------------------------------------------ estimated creatinine clearance is 55.2 mL/min (by C-G formula based on SCr of 0.5 mg/dL). ------------------------------------------------------------------------------------------------------------------ No results for input(s): TSH, T4TOTAL, T3FREE, THYROIDAB in the last 72 hours.  Invalid input(s): FREET3  Coagulation profile  Recent Labs Lab 10/15/16 2033  INR 0.95    ------------------------------------------------------------------------------------------------------------------- No results for input(s): DDIMER in the last 72 hours. -------------------------------------------------------------------------------------------------------------------  Cardiac Enzymes No results for input(s): CKMB, TROPONINI, MYOGLOBIN in the last 168 hours.  Invalid input(s): CK ------------------------------------------------------------------------------------------------------------------ No results found for: BNP   ---------------------------------------------------------------------------------------------------------------  Urinalysis    Component Value Date/Time   COLORURINE YELLOW 10/15/2016 2053   APPEARANCEUR CLEAR 10/15/2016 2053   LABSPEC 1.030 10/15/2016 2053   PHURINE 5.0 10/15/2016 2053   GLUCOSEU >=500 (A) 10/15/2016 2053   HGBUR NEGATIVE 10/15/2016 2053   BILIRUBINUR NEGATIVE 10/15/2016 2053   BILIRUBINUR n 03/06/2015 0948   KETONESUR 5 (A) 10/15/2016 2053   PROTEINUR 100 (A) 10/15/2016 2053   UROBILINOGEN 1.0 03/06/2015 0948   UROBILINOGEN 1.0 09/23/2011 2130   NITRITE NEGATIVE 10/15/2016 2053   LEUKOCYTESUR NEGATIVE 10/15/2016 2053    ----------------------------------------------------------------------------------------------------------------   Imaging Results:    Ct Head Wo Contrast  Result Date: 10/15/2016 CLINICAL DATA:  Left hand numbness for 2 days.  No reported injury. EXAM: CT HEAD WITHOUT CONTRAST TECHNIQUE: Contiguous axial images were obtained from the base of the skull through the vertex without intravenous contrast. COMPARISON:  03/04/2007 brain MRI. FINDINGS: Brain: Focal white matter hypodensity in the posterior right frontal deep white matter, new since 03/04/2007. No evidence of parenchymal hemorrhage or extra-axial fluid collection. No mass lesion, mass effect, or midline shift. Cerebral volume is age appropriate. No  ventriculomegaly. Vascular: No acute abnormality. Skull: No evidence of calvarial fracture. Sinuses/Orbits: The visualized paranasal sinuses are essentially clear. Other:  The mastoid air cells are unopacified. IMPRESSION: 1. Focal white matter hypodensity in the posterior right frontal deep white matter, new  since 03/04/2007 brain MRI, potentially representing a subacute white matter infarct. Brain MRI correlation could be obtained as clinically warranted. 2. No mass-effect.  No acute intracranial hemorrhage. Electronically Signed   By: Ilona Sorrel M.D.   On: 10/15/2016 20:32   NSR at 80, nl axis, early R progression, no st-t changes c/w ischemia.     Assessment & Plan:    Principal Problem:   Stroke (cerebrum) ([place]) Active Problems:   Uncontrolled type 2 diabetes mellitus with insulin therapy (HCC)   Hypertension    L arm weakness ? CVA MRI brain  MRA brain  Carotid ultrasound Cardiac echo Check hga1c, check lipid Aspirin 325mg  po qday , plavix 75mg  po qday Continue lipitor  Dm2 fsbs ac and qhs, ISS Continue metformin  Continue lantus, humalog  Hypertension Continue amlodipine, valsartan/hydrochlorothiazide (note, recent recall on this medication due to suspected increase risk of cancer?)  Glaucoma Cont travatan    DVT Prophylaxis - SCDs   AM Labs Ordered, also please review Full Orders  Family Communication: Admission, patients condition and plan of care including tests being ordered have been discussed with the patient  who indicate understanding and agree with the plan and Code Status.  Code Status FULL CODE  Likely DC to  home  Condition GUARDED    Consults called: neurology  Admission status: inpatient  Time spent in minutes : 45   Jani Gravel M.D on 10/15/2016 at 9:45 PM  Between 7am to 7pm - Pager - [phone]. After 7pm go to [email or web address] - password [facility]  [facility] - Office  [phone]

## 2016-10-15 NOTE — ED Triage Notes (Addendum)
Pt c/o numbness in left hand x's 2 days.  Pt denies any injuries.  St's unable to grip anything.  No arm drift present.  Pt alert and oriented x's 3.  Pt has a tooth abscess which she just left dentist office for on left side

## 2016-10-15 NOTE — ED Provider Notes (Signed)
[facility] DEPT Provider Note   CSN: [id number] Arrival date & time: 10/15/16  1554     History   Chief Complaint Chief Complaint  Patient presents with  . Numbness    HPI Sarah [name] is a 71 y.o. female.  HPI   Patient is 71 year old female presenting with numbness in her left hand. Patient's daughter reports that she has some speech difficulties which duaghter attributed to  tramadol that she's been taking. Patient reports that she had some fullness her left shoulder and then noticed that her left arm started having trouble moving yesterday. She states that it feels "asleep".. Today she nwas at [facility] and  she couldn't break a piece of bread. Patient was seen today at an oral surgeon for 2 abscesses in her mouth and started on amoxicillin.  Patient has past medical history significant for hyperlipidemia and diabetes.  Past Medical History:  Diagnosis Date  . Boils   . Glaucoma   . Heart murmur   . HTN (hypertension)   . Hx of adenomatous colonic polyps   . Hyperlipidemia   . Osteoporosis   . Pneumonia   . Type II or unspecified type diabetes mellitus without mention of complication, uncontrolled     Patient Active Problem List   Diagnosis Date Noted  . Perirectal abscess 09/26/2015  . Vitamin D deficiency 07/23/2015  . Osteopenia 02/16/2013  . Ankle fracture 01/19/2013  . Anemia 09/27/2011  . Uncontrolled type 2 diabetes mellitus with insulin therapy ([place]) 09/24/2011  . Hyperlipidemia 09/24/2011  . Hypertension 09/24/2011    Past Surgical History:  Procedure Laterality Date  . ABDOMINAL HYSTERECTOMY    . CARPAL TUNNEL RELEASE     right  . COLONOSCOPY  12-10-10   per Dr. Deatra Ina, clear, repeat in 7 yrs   . INCISION AND DRAINAGE PERIRECTAL ABSCESS N/A 09/26/2015   Procedure: IRRIGATION AND DEBRIDEMENT PERIRECTAL ABSCESS;  Surgeon: Mickeal Skinner, MD;  Location: [facility];  Service: General;  Laterality: N/A;  . KNEE ARTHROSCOPY     right  .  ORIF ANKLE FRACTURE Right 03/24/2012   Procedure: OPEN REDUCTION INTERNAL FIXATION (ORIF) ANKLE FRACTURE;  Surgeon: Newt Minion, MD;  Location: [place];  Service: Orthopedics;  Laterality: Right;  Open Reduction Internal Fixation Right Bimalleolar ankle fracture  . POLYPECTOMY    . TONSILLECTOMY      OB History    No data available       Home Medications    Prior to Admission medications   Medication Sig Start Date End Date Taking? Authorizing Provider  albuterol (PROVENTIL HFA;VENTOLIN HFA) 108 (90 Base) MCG/ACT inhaler Inhale 2 puffs into the lungs every 6 (six) hours as needed for wheezing or shortness of breath. 02/05/16 03/06/16  [name], [name], MD  amLODipine (NORVASC) 10 MG tablet Take 1 tablet (10 mg total) by mouth daily. 08/10/16   [name], [name], MD  aspirin EC 81 MG tablet Take 81 mg by mouth daily.    [provider]  atorvastatin (LIPITOR) 20 MG tablet Take 1 tablet (20 mg total) by mouth daily. 03/15/14   Shawna Orleans, Doe-Hyun R, DO  Insulin Glargine (LANTUS) 100 UNIT/ML Solostar Pen Use 60 Units daily at the same time. May titrate up if FG >200. 10/28/15   [name], [name], MD  Insulin Lispro (HUMALOG KWIKPEN) 200 UNIT/ML SOPN Inject 10 Units into the skin 3 (three) times daily with meals. 04/08/16   [name], [name], MD  Insulin Pen Needle 32G X 4  MM MISC Use up to 4x a day 12/10/14   Philemon Kingdom, MD  insulin regular human CONCENTRATED (HUMULIN R U-500 KWIKPEN) 500 UNIT/ML kwikpen Inject 70-85 Units into the skin 3 (three) times daily with meals. 07/23/15   [name], [name], MD  metFORMIN (GLUCOPHAGE) 1000 MG tablet Take 1 tablet (1,000 mg total) by mouth 2 (two) times daily with a meal. Patient taking differently: Take 1,000 mg by mouth 2 (two) times daily with a meal. Only takes once a day 06/05/14   Philemon Kingdom, MD  saccharomyces boulardii (FLORASTOR) 250 MG capsule Take 1 capsule (250 mg total) by mouth 2 (two) times daily. You can get a probiotic over the counter.  09/29/15   Meuth, Brooke A, PA-C  valsartan-hydrochlorothiazide (DIOVAN-HCT) 320-25 MG tablet Take 1 tablet by mouth daily. 08/10/16   [name], [name], MD  Vitamin D, Ergocalciferol, (DRISDOL) 50000 units CAPS capsule TAKE ONE CAPSULE BY MOUTH TWICE A WEEK ON MONDAY AND THURSDAY 07/12/16   [name], [name], MD  Zoster Vac Recomb Adjuvanted [facility]) injection Inject 0.5 mLs into the muscle every 3 (three) months. 08/09/16 11/08/16  [name], [name], MD    Family History Family History  Problem Relation Age of Onset  . Diabetes Mother   . Hypertension Mother   . Colon cancer Neg Hx   . Esophageal cancer Neg Hx   . Rectal cancer Neg Hx   . Stomach cancer Neg Hx     Social History Social History  Substance Use Topics  . Smoking status: Current Some Day Smoker  . Smokeless tobacco: Never Used     Comment: smokes occ.   Marland Kitchen Alcohol use 0.0 oz/week     Comment: occ     Allergies   Patient has no known allergies.   Review of Systems Review of Systems  Constitutional: Negative for activity change.  Respiratory: Negative for shortness of breath.   Cardiovascular: Negative for chest pain.  Gastrointestinal: Negative for abdominal pain.     Physical Exam Updated Vital Signs BP (!) 152/89   Pulse 82   Temp 98.2 F (36.8 C) (Oral)   Resp (!) 22   Ht 5' (1.524 m)   Wt 63.5 kg (140 lb)   SpO2 97%   BMI 27.34 kg/m   Physical Exam  Constitutional: She is oriented to person, place, and time. She appears well-developed and well-nourished.  HENT:  Head: Normocephalic and atraumatic.  2 small infections, one to the right upper mandible one to left lower, nothing drainable  Eyes: Right eye exhibits no discharge.  Cardiovascular: Normal rate and regular rhythm.   No murmur heard. Pulmonary/Chest: Effort normal and breath sounds normal. No respiratory distress. She has no wheezes.  Abdominal: Soft. She exhibits no distension. There is no tenderness.  Neurological: She is oriented to  person, place, and time.  She has left hand weakness. Left hand numbness. Patient has facial droop on the left-hand side and slurred speech  Skin: Skin is warm and dry. She is not diaphoretic.  Psychiatric: She has a normal mood and affect.  Nursing note and vitals reviewed.    ED Treatments / Results  Labs (all labs ordered are listed, but only abnormal results are displayed) Labs Reviewed  ETHANOL  PROTIME-INR  APTT  CBC  DIFFERENTIAL  COMPREHENSIVE METABOLIC PANEL  RAPID URINE DRUG SCREEN, HOSP PERFORMED  URINALYSIS, ROUTINE W REFLEX MICROSCOPIC  I-STAT CHEM 8, ED  I-STAT TROPONIN, ED    EKG  EKG Interpretation None  Radiology No results found.  Procedures Procedures (including critical care time)  Medications Ordered in ED Medications - No data to display   Initial Impression / Assessment and Plan / ED Course  I have reviewed the triage vital signs and the nursing notes.  Pertinent labs & imaging results that were available during my care of the patient were reviewed by me and considered in my medical decision making (see chart for details).     Patient is 71 year old female presenting with numbness in her left hand. Patient's daughter reports that she has some speech difficulties which duaghter attributed to  tramadol that she's been taking. Patient reports that she had some fullness her left shoulder and then noticed that her left arm started having trouble moving yesterday. She states that it feels "asleep".. Today she nwas at [facility] and  she couldn't break a piece of bread. Patient was seen today at an oral surgeon for 2 abscesses in her mouth and started on amoxicillin.  Patient has past medical history significant for hyperlipidemia and diabetes.  8:27 PM This is been going on since yesterday, outside any kind of window for intervention. We'll get CT, and stroke workup.   9:28 PM Discussed with Leonel Ramsay, eviidence of stroke on CT. Will admit  to hospitilist with neurology following.    Final Clinical Impressions(s) / ED Diagnoses   Final diagnoses:  None    New Prescriptions New Prescriptions   No medications on file     Macarthur Critchley, MD 10/15/16 2128

## 2016-10-15 NOTE — Consult Note (Signed)
Neurology Consultation Reason for Consult: Left-sided weakness Referring Physician: Louretta Shorten, C  CC: Left-sided weakness  History is obtained from: Patient, daughter  HPI: Sarah [name] is a 71 y.o. female with a history of hypertension, diabetes who presents with left-sided weakness for 2 days. She states that she might have had some symptoms on 10/3, but got worse yesterday.due to still having symptoms today, she came into the emergency department.  Iin the ER, a CT was performed which shows a possible white matter infarct on the right  LKW: 10/3, unclear time tpa given?: no, outside of window    ROS: A 14 point ROS was performed and is negative except as noted in the HPI.   Past Medical History:  Diagnosis Date  . Boils   . Glaucoma   . Heart murmur   . HTN (hypertension)   . Hx of adenomatous colonic polyps   . Hyperlipidemia   . Osteoporosis   . Pneumonia   . Type II or unspecified type diabetes mellitus without mention of complication, uncontrolled      Family History  Problem Relation Age of Onset  . Diabetes Mother   . Hypertension Mother   . Colon cancer Neg Hx   . Esophageal cancer Neg Hx   . Rectal cancer Neg Hx   . Stomach cancer Neg Hx      Social History:  reports that she has been smoking.  She has never used smokeless tobacco. She reports that she drinks alcohol. She reports that she does not use drugs.   Exam: Current vital signs: BP (!) 169/74   Pulse 75   Temp 98.4 F (36.9 C)   Resp 17   Ht 5' (1.524 m)   Wt 63.5 kg (140 lb)   SpO2 99%   BMI 27.34 kg/m  Vital signs in last 24 hours: Temp:  [98.2 F (36.8 C)-98.4 F (36.9 C)] 98.4 F (36.9 C) (10/05 2116) Pulse Rate:  [75-105] 75 (10/05 2145) Resp:  [14-22] 17 (10/05 2145) BP: (145-185)/(62-94) 169/74 (10/05 2145) SpO2:  [95 %-99 %] 99 % (10/05 2145) Weight:  [63.5 kg (140 lb)] 63.5 kg (140 lb) (10/05 1618)   Physical Exam  Constitutional: Appears well-developed and  well-nourished.  Psych: Affect appropriate to situation Eyes: No scleral injection HENT: No OP obstrucion Head: Normocephalic.  Cardiovascular: Normal rate and regular rhythm.  Respiratory: Effort normal and breath sounds normal to anterior ascultation GI: Soft.  No distension. There is no tenderness.  Skin: WDI  Neuro: Mental Status: Patient is awake, alert, oriented to person, place, month, year, and situation. Patient is able to give a clear and coherent history. No signs of aphasia or neglect Cranial Nerves: II: Visual Fields are full. Pupils are equal, round, and reactive to light.   III,IV, VI: EOMI without ptosis or diploplia.  V: Facial sensation is symmetric to temperature VII: Facial movement is  weak on the left  VIII: hearing is intact to voice X: Uvula elevates symmetrically XI: Shoulder shrug is symmetric. XII: tongue is midline without atrophy or fasciculations.  Motor: Tone is normal. Bulk is normal. 5/5 strength was present in right arm and leg, the left arm has 4/5 weakness with fine motor movements affected out of proportion to total weakness, left leg with 4+/5 weakness  Sensory: Sensation is symmetric to light touch and temperature in the arms and legs. Cerebellar:  no clear ataxia, though fine motor movements are impaired on the left    I have reviewed  labs in epic and the results pertinent to this consultation are: CMP-elevated glucose at 335   I have reviewed the images obtained: CT head-possible right-sided white matter acute infarct  Impression: 71 year old female with subacute ischemic stroke. She will need to be admitted for therapy and evaluation for secondary risk factor modification. My suspicion at this time, is that this will likely represent small vessel disease but further evaluation is needed.  Recommendations: 1. HgbA1c, fasting lipid panel 2. MRI, MRA  of the brain without contrast 3. Frequent neuro checks 4. Echocardiogram 5. Carotid  dopplers 6. Prophylactic therapy-Antiplatelet med: Aspirin - dose 325mg  PO or 300mg  PR 7. Risk factor modification 8. Telemetry monitoring 9. PT consult, OT consult, Speech consult 10. please page stroke NP  Or  PA  Or MD  from 8am -4 pm as this patient will be followed by the stroke team at this point.   You can look them up on [email or web address]      Roland Rack, MD [facility] [phone]  If 7pm- 7am, please page neurology on call as listed in [place].

## 2016-10-16 ENCOUNTER — Inpatient Hospital Stay (HOSPITAL_COMMUNITY): Payer: Medicare Other

## 2016-10-16 DIAGNOSIS — I6789 Other cerebrovascular disease: Secondary | ICD-10-CM

## 2016-10-16 DIAGNOSIS — I63311 Cerebral infarction due to thrombosis of right middle cerebral artery: Secondary | ICD-10-CM

## 2016-10-16 LAB — GLUCOSE, CAPILLARY
Glucose-Capillary: 163 mg/dL — ABNORMAL HIGH (ref 65–99)
Glucose-Capillary: 98 mg/dL (ref 65–99)
Glucose-Capillary: 193 mg/dL — ABNORMAL HIGH (ref 65–99)

## 2016-10-16 LAB — COMPREHENSIVE METABOLIC PANEL
ALT: 8 U/L — ABNORMAL LOW (ref 14–54)
AST: 14 U/L — ABNORMAL LOW (ref 15–41)
Albumin: 2.8 g/dL — ABNORMAL LOW (ref 3.5–5.0)
Alkaline Phosphatase: 73 U/L (ref 38–126)
Anion gap: 8 (ref 5–15)
BUN: 10 mg/dL (ref 6–20)
CO2: 24 mmol/L (ref 22–32)
Calcium: 8.9 mg/dL (ref 8.9–10.3)
Chloride: 106 mmol/L (ref 101–111)
Creatinine, Ser: 0.52 mg/dL (ref 0.44–1.00)
GFR calc Af Amer: >60 mL/min (ref >60)
GFR calc non Af Amer: >60 mL/min (ref >60)
Glucose, Bld: 230 mg/dL — ABNORMAL HIGH (ref 65–99)
Potassium: 3.4 mmol/L — ABNORMAL LOW (ref 3.5–5.1)
Sodium: 138 mmol/L (ref 135–145)
Total Bilirubin: 0.4 mg/dL (ref 0.3–1.2)
Total Protein: 5.8 g/dL — ABNORMAL LOW (ref 6.5–8.1)

## 2016-10-16 LAB — CBC
HCT: 36.1 % (ref 36.0–46.0)
Hemoglobin: 11.8 g/dL — ABNORMAL LOW (ref 12.0–15.0)
MCH: 30.3 pg (ref 26.0–34.0)
MCHC: 32.7 g/dL (ref 30.0–36.0)
MCV: 92.6 fL (ref 78.0–100.0)
Platelets: 330 10*3/uL (ref 150–400)
RBC: 3.9 MIL/uL (ref 3.87–5.11)
RDW: 12.5 % (ref 11.5–15.5)
WBC: 8.4 10*3/uL (ref 4.0–10.5)

## 2016-10-16 LAB — HEMOGLOBIN A1C
Hgb A1c MFr Bld: 12.9 % — ABNORMAL HIGH (ref 4.8–5.6)
Mean Plasma Glucose: 323.53 mg/dL

## 2016-10-16 LAB — LIPID PANEL
Cholesterol: 156 mg/dL (ref 0–200)
HDL: 37 mg/dL — ABNORMAL LOW (ref >40)
LDL Cholesterol: 98 mg/dL (ref 0–99)
Total CHOL/HDL Ratio: 4.2 RATIO
Triglycerides: 104 mg/dL (ref <150)
VLDL: 21 mg/dL (ref 0–40)

## 2016-10-16 LAB — TROPONIN I
Troponin I: <0.03 ng/mL (ref <0.03)
Troponin I: <0.03 ng/mL (ref <0.03)

## 2016-10-16 LAB — ECHOCARDIOGRAM COMPLETE
Height: 60 in
Weight: 2227.2 oz

## 2016-10-16 LAB — MAGNESIUM: Magnesium: 1.6 mg/dL — ABNORMAL LOW (ref 1.7–2.4)

## 2016-10-16 MED ORDER — SODIUM CHLORIDE 0.9 % IV SOLN
INTRAVENOUS | Status: DC
  Administered 2016-10-16: 03:00:00 via INTRAVENOUS

## 2016-10-16 MED ORDER — ATORVASTATIN CALCIUM 40 MG PO TABS
40.0000 mg | ORAL_TABLET | Freq: Every day | ORAL | Status: DC
  Administered 2016-10-17 – 2016-10-19 (×3): 40 mg via ORAL
  Filled 2016-10-16 (×3): qty 1

## 2016-10-16 NOTE — Progress Notes (Signed)
Canceled rapid urine for drugs. Already performed.

## 2016-10-16 NOTE — Progress Notes (Signed)
VASCULAR LAB PRELIMINARY  PRELIMINARY  PRELIMINARY  PRELIMINARY  Carotid duplex completed.    Preliminary report:  40-59% right ICA stenosis. 1-39% left ICA stenosis. Vertebral artery flow is antegrade.     Eron Staat, RVT 10/16/2016, 6:40 PM

## 2016-10-16 NOTE — Progress Notes (Signed)
STROKE TEAM PROGRESS NOTE   HISTORY OF PRESENT ILLNESS (per record) Sarah [name] is a 71 y.o. female with a history of hypertension and diabetes who presents with left-sided weakness for 2 days. She states that she might have had some symptoms on 10/3, but got worse yesterday.due to still having symptoms today, she came into the emergency department.  Iin the ER, a CT was performed which shows a possible white matter infarct on the right  LKW: 10/3, unclear time tpa given?: no, outside of window   SUBJECTIVE (INTERVAL HISTORY) No family members present. The patient's left hand remains weak.   OBJECTIVE Temp:  [97.9 F (36.6 C)-98.4 F (36.9 C)] 97.9 F (36.6 C) (10/06 9629) Pulse Rate:  [62-114] 62 (10/06 0633) Cardiac Rhythm: Normal sinus rhythm (10/05 2254) Resp:  [14-22] 18 (10/06 0633) BP: (138-191)/(62-94) 141/68 (10/06 0633) SpO2:  [95 %-99 %] 97 % (10/06 0633) Weight:  [139 lb 3.2 oz (63.1 kg)-140 lb (63.5 kg)] 139 lb 3.2 oz (63.1 kg) (10/05 2251)  CBC:   Recent Labs Lab 10/15/16 2033 10/15/16 2042 10/16/16 0533  WBC 10.1  --  8.4  NEUTROABS 6.4  --   --   HGB 13.2 14.3 11.8*  HCT 39.8 42.0 36.1  MCV 92.8  --  92.6  PLT 363  --  528    Basic Metabolic Panel:   Recent Labs Lab 10/15/16 2033 10/15/16 2042 10/16/16 0030 10/16/16 0533  NA 138 139  --  138  K 4.0 3.8  --  3.4*  CL 104 103  --  106  CO2 24  --   --  24  GLUCOSE 335* 333*  --  230*  BUN 13 16  --  10  CREATININE 0.69 0.50  --  0.52  CALCIUM 9.7  --   --  8.9  MG  --   --  1.6*  --     Lipid Panel:     Component Value Date/Time   CHOL 156 10/16/2016 0533   TRIG 104 10/16/2016 0533   HDL 37 (L) 10/16/2016 0533   CHOLHDL 4.2 10/16/2016 0533   VLDL 21 10/16/2016 0533   LDLCALC 98 10/16/2016 0533   HgbA1c:  Lab Results  Component Value Date   HGBA1C 12.9 (H) 10/16/2016   Urine Drug Screen:     Component Value Date/Time   LABOPIA NONE DETECTED 10/15/2016 2053    COCAINSCRNUR NONE DETECTED 10/15/2016 2053   LABBENZ NONE DETECTED 10/15/2016 2053   AMPHETMU NONE DETECTED 10/15/2016 2053   THCU NONE DETECTED 10/15/2016 2053   LABBARB NONE DETECTED 10/15/2016 2053    Alcohol Level     Component Value Date/Time   [facility] <10 10/15/2016 2033    IMAGING   Dg Chest 2 View 10/16/2016 IMPRESSION:  No active cardiopulmonary disease.     Ct Head Wo Contrast 10/15/2016 IMPRESSION:  1. Focal white matter hypodensity in the posterior right frontal deep white matter, new since 03/04/2007 brain MRI, potentially representing a subacute white matter infarct. Brain MRI correlation could be obtained as clinically warranted.  2. No mass-effect.  No acute intracranial hemorrhage.     Mr Jodene Nam Head Wo Contrast 10/16/2016  IMPRESSION:  1. Distal right middle cerebral artery M1 segment occlusion.  2. Multifocal acute ischemia within the right MCA distribution, with the largest area measuring 1.5 cm.  3. No acute hemorrhage or mass effect.      Transthoracic Echocardiogram - pending   Bilateral Carotid Dopplers - pending  PHYSICAL EXAM Vitals:   10/16/16 0200 10/16/16 0400 10/16/16 0426 10/16/16 0633  BP: (!) 158/63 138/76 140/68 (!) 141/68  Pulse: 63 68 65 62  Resp: 16 16 16 18   Temp: 98.1 F (36.7 C) 98.2 F (36.8 C)  97.9 F (36.6 C)  TempSrc: Oral Oral  Oral  SpO2: 98% 96% 98% 97%  Weight:      Height:       Pleasant middle aged lady not in distress. . Afebrile. Head is nontraumatic. Neck is supple without bruit.    Cardiac exam no murmur or gallop. Lungs are clear to auscultation. Distal pulses are well felt. Neurological Exam :  Awake alert oriented x 3 normal speech and language. Mild left lower face asymmetry. Tongue midline. Mild LUE drift.LUE proximally 3/5 but distally marked weakness on left. Orbits right over left upper extremity. Severe  left grip weakness.. Normal sensation . Normal coordination.Gait  deferred  ASSESSMENT/PLAN [name] is a 71 y.o. female with history of diabetes mellitus, ongoing tobacco use, hyperlipidemia, hypertension,  presenting with left-sided weakness. She did not receive IV t-PA due to late presentation.  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  Resultant  LUE weakness  CT head - Focal white matter hypodensity in the posterior right frontal deep white matter,  MRI head - Multifocal acute ischemia within the right MCA distribution, with the largest area measuring 1.5 cm.   MRA head - Distal right middle cerebral artery M1 segment occlusion.   Carotid Doppler - pending  2D Echo - EF 55-60%. No cardiac source of emboli identified.  LDL - 98  HgbA1c - 12.9  VTE prophylaxis - SCDs Diet heart healthy/carb modified Room service appropriate? Yes; Fluid consistency: Thin  aspirin 81 mg daily prior to admission, now on aspirin 325 mg daily and clopidogrel 75 mg daily  Patient counseled to be compliant with her antithrombotic medications  Ongoing aggressive stroke risk factor management  Therapy recommendations: - pending  Disposition:  Pending  Hypertension  Stable  Permissive hypertension (OK if < 220/120) but gradually normalize in 5-7 days  Long-term BP goal normotensive  Hyperlipidemia  Home meds:  Lipitor 20 mg daily resumed in hospital  LDL 98, goal < 70  Increase Lipitor to 40 mg daily  Continue statin at discharge  Diabetes  HgbA1c 12.9, goal < 7.0  Uncontrolled  Other Stroke Risk Factors  Advanced age  Cigarette smoker - advised to stop smoking  ETOH use, advised to drink no more than 1 drink per day.  Overweight, Body mass index is 27.19 kg/m., recommend weight loss, diet and exercise as appropriate    Other Active Problems  Mild hypokalemia - 3.4  Hospital day # 1  Sarah Bussing PA-C [facility] Pager [phone] 10/16/2016, 12:46 PM I have  personally examined this patient, reviewed notes, independently viewed imaging studies, participated in medical decision making and plan of care.ROS completed by me personally and pertinent positives fully documented  I have made any additions or clarifications directly to the above note.  She presented with embolic right MCA branch infarct and stroke work up is pending. Recommend dual antiplatelet therapy x 3 months followed by Plavix alone. May need TEE and loop recorder as outpatient if no embolic source is found.I spent  35 minutes in total face-to-face time with the patient, more than 50% of which was spent in counseling and coordination of care, reviewing test results, reviewing medication and discussing  or reviewing the diagnosis of    , the prognosis and treatment options.    Antony Contras, MD Medical Director [facility] Pager: [phone] 10/16/2016 1:41 PM    To contact Stroke Continuity provider, please refer to [email or web address] After hours, contact General Neurology

## 2016-10-16 NOTE — Progress Notes (Signed)
Patient arrived to floor via stretcher from ED accompanied by sister and brother in law. Patient alert and oriented X 4, with no complaints of pain.Passed swallow evaluation in the ED. Was hungry, given [place] sandwich, applesauce and graham crackers with diet ginger ale. Patient armband verified, and yellow band with yellow socks placed on her. She was able to ambulate from stretcher to bed with 1 assist.  Steady gait. IV is a 20 gauge in her R arm. Placed o tele box # 6 with a second verifier. Went over orders with patient, explained plan of care and safety. Patient verbalized understanding. Bed alarm on for safety. Will continue to monitor.

## 2016-10-16 NOTE — Evaluation (Signed)
Speech Language Pathology Evaluation Patient Details Name: Sarah [name] MRN: [id number] DOB: [birth year] Today's Date: 10/16/2016 Time: 1100-1120 SLP Time Calculation (min) (ACUTE ONLY): 20 min  Problem List:  Patient Active Problem List   Diagnosis Date Noted  . Stroke (cerebrum) ([place]) 10/15/2016  . Perirectal abscess 09/26/2015  . Vitamin D deficiency 07/23/2015  . Osteopenia 02/16/2013  . Ankle fracture 01/19/2013  . Anemia 09/27/2011  . Uncontrolled type 2 diabetes mellitus with insulin therapy ([place]) 09/24/2011  . Hyperlipidemia 09/24/2011  . Hypertension 09/24/2011   Past Medical History:  Past Medical History:  Diagnosis Date  . Boils   . Glaucoma   . Heart murmur   . HTN (hypertension)   . Hx of adenomatous colonic polyps   . Hyperlipidemia   . Osteoporosis   . Pneumonia   . Type II or unspecified type diabetes mellitus without mention of complication, uncontrolled    Past Surgical History:  Past Surgical History:  Procedure Laterality Date  . ABDOMINAL HYSTERECTOMY    . CARPAL TUNNEL RELEASE     right  . COLONOSCOPY  12-10-10   per Dr. Deatra Ina, clear, repeat in 7 yrs   . INCISION AND DRAINAGE PERIRECTAL ABSCESS N/A 09/26/2015   Procedure: IRRIGATION AND DEBRIDEMENT PERIRECTAL ABSCESS;  Surgeon: Mickeal Skinner, MD;  Location: [place];  Service: General;  Laterality: N/A;  . KNEE ARTHROSCOPY     right  . ORIF ANKLE FRACTURE Right 03/24/2012   Procedure: OPEN REDUCTION INTERNAL FIXATION (ORIF) ANKLE FRACTURE;  Surgeon: Newt Minion, MD;  Location: [place];  Service: Orthopedics;  Laterality: Right;  Open Reduction Internal Fixation Right Bimalleolar ankle fracture  . POLYPECTOMY    . TONSILLECTOMY     HPI:  Pt is a 71 y/o female admitted secondary to L facial droop and L sided weakness (UE>LE). MRI revealed a distal R MCA occlusion. PMH including but not limited to HTN, DM, HLD.   Assessment / Plan / Recommendation Clinical Impression  Patient  presents with a mild dysarthria which is also impacted by left sided molar abcess, resulting in reduced overall speech intelligibility and quality. Patient also exhibits a mild cognitive-linguistic deficit with poor overall awareness to impact of her deficits, decreased cognitive processing speed, flat affect (unable to determine if baseline). Patient does not appear impulsive, but in fact is very lethargic, although per RN, she is not currently on medications that should be causing this. Patient does not require any acute speech-language therapy services but feel that evaluation of higher-level cognition and language at CIR/outpatient or home health level would be appropriate to determine if any needs.     SLP Assessment  SLP Recommendation/Assessment: All further Speech Lanaguage Pathology  needs can be addressed in the next venue of care SLP Visit Diagnosis: Cognitive communication deficit (R41.841)    Follow Up Recommendations  Home health SLP;Inpatient Rehab    Frequency and Duration   Defer for CIR        SLP Evaluation Cognition  Overall Cognitive Status: Impaired/Different from baseline Arousal/Alertness: Lethargic Orientation Level: Oriented X4 Attention: Selective Sustained Attention: Impaired Sustained Attention Impairment: Verbal complex Selective Attention: Impaired Selective Attention Impairment: Verbal basic;Verbal complex Memory: Appears intact Awareness: Impaired Awareness Impairment: Intellectual impairment Problem Solving: Impaired Problem Solving Impairment: Verbal complex Executive Function: Reasoning Reasoning: Impaired Reasoning Impairment: Verbal complex Safety/Judgment: Impaired       Comprehension  Auditory Comprehension Overall Auditory Comprehension: Appears within functional limits for tasks assessed    Expression Expression Primary Mode  of Expression: Verbal Verbal Expression Overall Verbal Expression: Appears within functional limits for tasks  assessed Written Expression Dominant Hand: Right   Oral / Motor  Oral Motor/Sensory Function Overall Oral Motor/Sensory Function: Mild impairment Facial ROM: Reduced left Facial Symmetry: Abnormal symmetry left Facial Strength: Reduced left Facial Sensation: Reduced left Lingual ROM: Within Functional Limits Lingual Symmetry: Within Functional Limits Lingual Strength: Reduced Lingual Sensation: Within Functional Limits Velum: Within Functional Limits Mandible: Within Functional Limits Motor Speech Overall Motor Speech: Impaired Respiration: Within functional limits Phonation: Normal Resonance: Within functional limits Articulation: Impaired Level of Impairment: Phrase Intelligibility: Intelligibility reduced Word: 75-100% accurate Phrase: 75-100% accurate Sentence: 75-100% accurate Conversation: Not tested Motor Planning: Witnin functional limits   [place], MA, CCC-SLP 10/16/16 4:32 PM

## 2016-10-16 NOTE — Progress Notes (Signed)
PROGRESS NOTE    Sarah [name]  EQA:[id number] DOB: Jun 05, 1945 DOA: 10/15/2016 PCP: [name], [name], MD   Brief Narrative: Sarah [name] is a 71 y.o. female, w hypertension, hyperlipidemia, dm2, w c/o left arm/hand weakness since yesterday am. Slight numbness, tingling.  Pt denies headache, vision change, cp, palp, sob, n/v.  Pt may have slight facial droop as well as slight dysarthria per family. Patient found to have an acute CVA on MRI   Assessment & Plan:   Principal Problem:   Stroke (cerebrum) ([place]) Active Problems:   Uncontrolled type 2 diabetes mellitus with insulin therapy ([place])   Hypertension   CVA MRI significant for right MCA segment occlusion with evidence of multifocal acute ischemia within MCA distribution. Echocardiogram significant for grade 2 diastolic heart dysfunction and an EF of 55-60% and PA peak pressure of 64mm. LDL of 98. A1C of 12.9. -PT/OT -neuro: asa/plavix; TEE loop recorder  Diabetes mellitus, type 2 -continue SSI -continue Lantus -continue Metformin  Essential hypertension -continue amlodipine, hydrochlorothiazide, irbesartan  Glaucoma Continue Travatan  Diastolic heart dysfunction EF of 55-60% with grade 2 diastolic dysfunction   DVT prophylaxis: SCDs Code Status: Full code Family Communication: None at bedside Disposition Plan: Discharge likely in 24 hours   Consultants:   Neurology  Procedures:   Echocardiogram (10/6)  Antimicrobials:  None    Subjective: Headache.  Objective: Vitals:   10/16/16 0400 10/16/16 0426 10/16/16 0633 10/16/16 0847  BP: 138/76 140/68 (!) 141/68 132/71  Pulse: 68 65 62 79  Resp: 16 16 18 20   Temp: 98.2 F (36.8 C)  97.9 F (36.6 C) 98.8 F (37.1 C)  TempSrc: Oral  Oral Oral  SpO2: 96% 98% 97% 98%  Weight:      Height:        Intake/Output Summary (Last 24 hours) at 10/16/16 1256 Last data filed at 10/16/16 0846  Gross per 24 hour  Intake             1710 ml    Output                0 ml  Net             1710 ml   Filed Weights   10/15/16 1618 10/15/16 2251  Weight: 63.5 kg (140 lb) 63.1 kg (139 lb 3.2 oz)    Examination:  General exam: Appears calm and comfortable Respiratory system: Clear to auscultation. Respiratory effort normal. Cardiovascular system: S1 & S2 heard, RRR. No murmurs. Gastrointestinal system: Abdomen is nondistended, soft and nontender. No organomegaly or masses felt. Normal bowel sounds heard. Central nervous system: Alert and oriented. 3/5 LUE strength compared to 5/5 for RUE and bilateral L extremities. Extremities: No edema. No calf tenderness Skin: No cyanosis. No rashes Psychiatry: Judgement and insight appear normal. Mood & affect appropriate.     Data Reviewed: I have personally reviewed following labs and imaging studies  CBC:  Recent Labs Lab 10/15/16 2033 10/15/16 2042 10/16/16 0533  WBC 10.1  --  8.4  NEUTROABS 6.4  --   --   HGB 13.2 14.3 11.8*  HCT 39.8 42.0 36.1  MCV 92.8  --  92.6  PLT 363  --  979   Basic Metabolic Panel:  Recent Labs Lab 10/15/16 2033 10/15/16 2042 10/16/16 0030 10/16/16 0533  NA 138 139  --  138  K 4.0 3.8  --  3.4*  CL 104 103  --  106  CO2 24  --   --  24  GLUCOSE 335* 333*  --  230*  BUN 13 16  --  10  CREATININE 0.69 0.50  --  0.52  CALCIUM 9.7  --   --  8.9  MG  --   --  1.6*  --    GFR: Estimated Creatinine Clearance: 55 mL/min (by C-G formula based on SCr of 0.52 mg/dL). Liver Function Tests:  Recent Labs Lab 10/15/16 2033 10/16/16 0533  AST 14* 14*  ALT 11* 8*  ALKPHOS 93 73  BILITOT 0.4 0.4  PROT 7.0 5.8*  ALBUMIN 3.5 2.8*   No results for input(s): LIPASE, AMYLASE in the last 168 hours. No results for input(s): AMMONIA in the last 168 hours. Coagulation Profile:  Recent Labs Lab 10/15/16 2033  INR 0.95   Cardiac Enzymes:  Recent Labs Lab 10/16/16 0030 10/16/16 0533  TROPONINI <0.03 <0.03   BNP (last 3 results) No  results for input(s): PROBNP in the last 8760 hours. HbA1C:  Recent Labs  10/16/16 0533  HGBA1C 12.9*   CBG:  Recent Labs Lab 10/15/16 2232 10/16/16 0618 10/16/16 1121  GLUCAP 308* 163* 98   Lipid Profile:  Recent Labs  10/16/16 0533  CHOL 156  HDL 37*  LDLCALC 98  TRIG 104  CHOLHDL 4.2   Thyroid Function Tests: No results for input(s): TSH, T4TOTAL, FREET4, T3FREE, THYROIDAB in the last 72 hours. Anemia Panel: No results for input(s): VITAMINB12, FOLATE, FERRITIN, TIBC, IRON, RETICCTPCT in the last 72 hours. Sepsis Labs: No results for input(s): PROCALCITON, LATICACIDVEN in the last 168 hours.  No results found for this or any previous visit (from the past 240 hour(s)).       Radiology Studies: Dg Chest 2 View  Result Date: 10/16/2016 CLINICAL DATA:  Stroke.  Left-sided weakness. EXAM: CHEST  2 VIEW COMPARISON:  02/06/2016 FINDINGS: The heart size and mediastinal contours are within normal limits. Both lungs are clear. The visualized skeletal structures are unremarkable. IMPRESSION: No active cardiopulmonary disease. Electronically Signed   By: Lucienne Capers M.D.   On: 10/16/2016 00:54   Ct Head Wo Contrast  Result Date: 10/15/2016 CLINICAL DATA:  Left hand numbness for 2 days.  No reported injury. EXAM: CT HEAD WITHOUT CONTRAST TECHNIQUE: Contiguous axial images were obtained from the base of the skull through the vertex without intravenous contrast. COMPARISON:  03/04/2007 brain MRI. FINDINGS: Brain: Focal white matter hypodensity in the posterior right frontal deep white matter, new since 03/04/2007. No evidence of parenchymal hemorrhage or extra-axial fluid collection. No mass lesion, mass effect, or midline shift. Cerebral volume is age appropriate. No ventriculomegaly. Vascular: No acute abnormality. Skull: No evidence of calvarial fracture. Sinuses/Orbits: The visualized paranasal sinuses are essentially clear. Other:  The mastoid air cells are unopacified.  IMPRESSION: 1. Focal white matter hypodensity in the posterior right frontal deep white matter, new since 03/04/2007 brain MRI, potentially representing a subacute white matter infarct. Brain MRI correlation could be obtained as clinically warranted. 2. No mass-effect.  No acute intracranial hemorrhage. Electronically Signed   By: Ilona Sorrel M.D.   On: 10/15/2016 20:32   Mr Brain Wo Contrast  Result Date: 10/16/2016 CLINICAL DATA:  Left arm and hand weakness.  Slurred screw. EXAM: MRI HEAD WITHOUT CONTRAST MRA HEAD WITHOUT CONTRAST TECHNIQUE: Multiplanar, multiecho pulse sequences of the brain and surrounding structures were obtained without intravenous contrast. Angiographic images of the head were obtained using MRA technique without contrast. COMPARISON:  Head CT 10/15/2016 Brain MRI 03/04/2007 FINDINGS: MRI HEAD FINDINGS  Brain: The midline structures are normal. There are numerous scattered foci of diffusion restriction with in the right MCA distribution. The largest area is in the right centrum semiovale and measures approximately 1.5 cm. There is no mass effect. No acute hemorrhage. There is multifocal cytotoxic edema associated with these lesions. There is multifocal hyperintense T2-weighted signal within the periventricular white matter, most often seen in the setting of chronic microvascular ischemia. Single focus of chronic microhemorrhage in the left caudate head. No hydrocephalus, age advanced atrophy or lobar predominant volume loss. No dural abnormality or extra-axial collection. Skull and upper cervical spine: The visualized skull base, calvarium, upper cervical spine and extracranial soft tissues are normal. Sinuses/Orbits: No fluid levels or advanced mucosal thickening. No mastoid effusion. Normal orbits. MRA HEAD FINDINGS Intracranial internal carotid arteries: Normal. Anterior cerebral arteries: Normal. Middle cerebral arteries: The distal right M1 segment is occluded. This is distal to an  early bifurcation. There is normal flow related enhancement distally within the right MCA distribution. The left middle cerebral artery is normal. Posterior communicating arteries: Absent bilaterally Posterior cerebral arteries: Normal. Basilar artery: Normal. Vertebral arteries: Codominant. Normal. Superior cerebellar arteries: Normal. Anterior inferior cerebellar arteries: Right-dominant Posterior inferior cerebellar arteries: Left dominant IMPRESSION: 1. Distal right middle cerebral artery M1 segment occlusion. 2. Multifocal acute ischemia within the right MCA distribution, with the largest area measuring 1.5 cm. 3. No acute hemorrhage or mass effect. Findings communicated via text page to Dr. Roland Rack at 2:06 a.m., 10/16/2016. Electronically Signed   By: Ulyses Jarred M.D.   On: 10/16/2016 02:06   Mr Jodene Nam Head Wo Contrast  Result Date: 10/16/2016 CLINICAL DATA:  Left arm and hand weakness.  Slurred screw. EXAM: MRI HEAD WITHOUT CONTRAST MRA HEAD WITHOUT CONTRAST TECHNIQUE: Multiplanar, multiecho pulse sequences of the brain and surrounding structures were obtained without intravenous contrast. Angiographic images of the head were obtained using MRA technique without contrast. COMPARISON:  Head CT 10/15/2016 Brain MRI 03/04/2007 FINDINGS: MRI HEAD FINDINGS Brain: The midline structures are normal. There are numerous scattered foci of diffusion restriction with in the right MCA distribution. The largest area is in the right centrum semiovale and measures approximately 1.5 cm. There is no mass effect. No acute hemorrhage. There is multifocal cytotoxic edema associated with these lesions. There is multifocal hyperintense T2-weighted signal within the periventricular white matter, most often seen in the setting of chronic microvascular ischemia. Single focus of chronic microhemorrhage in the left caudate head. No hydrocephalus, age advanced atrophy or lobar predominant volume loss. No dural abnormality  or extra-axial collection. Skull and upper cervical spine: The visualized skull base, calvarium, upper cervical spine and extracranial soft tissues are normal. Sinuses/Orbits: No fluid levels or advanced mucosal thickening. No mastoid effusion. Normal orbits. MRA HEAD FINDINGS Intracranial internal carotid arteries: Normal. Anterior cerebral arteries: Normal. Middle cerebral arteries: The distal right M1 segment is occluded. This is distal to an early bifurcation. There is normal flow related enhancement distally within the right MCA distribution. The left middle cerebral artery is normal. Posterior communicating arteries: Absent bilaterally Posterior cerebral arteries: Normal. Basilar artery: Normal. Vertebral arteries: Codominant. Normal. Superior cerebellar arteries: Normal. Anterior inferior cerebellar arteries: Right-dominant Posterior inferior cerebellar arteries: Left dominant IMPRESSION: 1. Distal right middle cerebral artery M1 segment occlusion. 2. Multifocal acute ischemia within the right MCA distribution, with the largest area measuring 1.5 cm. 3. No acute hemorrhage or mass effect. Findings communicated via text page to Dr. Roland Rack at 2:06 a.m., 10/16/2016. Electronically Signed  By: Ulyses Jarred M.D.   On: 10/16/2016 02:06        Scheduled Meds: . amLODipine  10 mg Oral Daily  . aspirin EC  325 mg Oral Daily  . [START ON 10/17/2016] atorvastatin  40 mg Oral Daily  . clopidogrel  75 mg Oral Daily  . irbesartan  300 mg Oral Daily   And  . hydrochlorothiazide  25 mg Oral Daily  . insulin aspart  0-5 Units Subcutaneous QHS  . insulin aspart  0-9 Units Subcutaneous TID WC  . insulin aspart  10 Units Subcutaneous TID WC  . insulin glargine  60 Units Subcutaneous Q2200  . latanoprost  1 drop Both Eyes QHS  . metFORMIN  1,000 mg Oral BID WC  . saccharomyces boulardii  250 mg Oral BID  . [START ON 10/18/2016] Vitamin D (Ergocalciferol)  50,000 Units Oral Once per day on Mon  Thu   Continuous Infusions: . sodium chloride    . sodium chloride 75 mL/hr at 10/16/16 0249     LOS: 1 day     Cordelia Poche, MD [facility] 10/16/2016, 12:56 PM Pager: [phone]  If 7PM-7AM, please contact night-coverage [email or web address] Password [email or web address] 10/16/2016, 12:56 PM

## 2016-10-16 NOTE — Progress Notes (Signed)
  Echocardiogram 2D Echocardiogram has been performed.  Darlina Sicilian M 10/16/2016, 9:30 AM

## 2016-10-16 NOTE — Progress Notes (Signed)
Patient had 12 lead EKG done. Received 4 units novolog for nighttime coverage. Waiting for 60 units of lantus from pharmacy.  Patient off the floor for XRay, MRI. Contacted central tele and placed on hold. Placed Normal saline on hold, as well as Xalatan and the Lantus.

## 2016-10-16 NOTE — Evaluation (Signed)
Occupational Therapy Evaluation Patient Details Name: Sarah [name] MRN: [id number] DOB: 07-04-1945 Today's Date: 10/16/2016    History of Present Illness Pt is a 71 y/o female admitted secondary to L facial droop and L sided weakness (UE>LE). MRI revealed a distal R MCA occlusion. PMH including but not limited to HTN, DM, HLD.   Clinical Impression   Pt admitted with the above diagnoses and presents with below problem list. Pt will benefit from continued acute OT to address the below listed deficits and maximize independence with basic ADLs prior to d/c to next venue. PTA pt was independent with ADLs. Pt is currently min to mod A with most ADLs, very unsteady during functional mobility. L side weakness impacting ADLs. Pt has experienced a significant change in functional independence level and would benefit greatly from further therapy.       Follow Up Recommendations  CIR    Equipment Recommendations  Other (comment) (defer to next venue)    Recommendations for Other Services       Precautions / Restrictions Precautions Precautions: Fall Restrictions Weight Bearing Restrictions: No      Mobility Bed Mobility Overal bed mobility: Needs Assistance Bed Mobility: Supine to Sit;Sit to Supine     Supine to sit: Min guard Sit to supine: Min guard   General bed mobility comments: increased time and effort, use of bed rails, min guard for safety  Transfers Overall transfer level: Needs assistance Equipment used: None;1 person hand held assist Transfers: Sit to/from Stand Sit to Stand: Min guard;Min assist         General transfer comment: increased time and effort, close min guard for safety, min A to steady; from EOB and toilet.    Balance Overall balance assessment: Needs assistance Sitting-balance support: Feet supported Sitting balance-Leahy Scale: Good Sitting balance - Comments: able to don/doff socks sitting EOB with supervision   Standing balance  support: During functional activity;No upper extremity supported;Single extremity supported Standing balance-Leahy Scale: Fair Standing balance comment: static standing balance is fair with min guard; dynamic is poor. Sink for external support during grooming in standing.                            ADL either performed or assessed with clinical judgement   ADL Overall ADL's : Needs assistance/impaired Eating/Feeding: Set up;Sitting   Grooming: Minimal assistance;Standing Grooming Details (indicate cue type and reason): assist for activities involving LUE Upper Body Bathing: Minimal assistance;Sitting   Lower Body Bathing: Sit to/from stand;Moderate assistance   Upper Body Dressing : Minimal assistance;Sitting   Lower Body Dressing: Sit to/from stand;Moderate assistance   Toilet Transfer: Minimal assistance;Ambulation Toilet Transfer Details (indicate cue type and reason): HHA at times Toileting- Clothing Manipulation and Hygiene: Minimal assistance;Sit to/from stand   Tub/ Banker: Moderate assistance;Tub transfer;Ambulation;3 in 1   Functional mobility during ADLs: Minimal assistance (+1 HHA) General ADL Comments: Pt completed bed mobility, toilet transfer, grooming task at sink.     Vision Baseline Vision/History: Cataracts Patient Visual Report: No change from baseline       Perception     Praxis      Pertinent Vitals/Pain Pain Assessment: No/denies pain     Hand Dominance Right   Extremity/Trunk Assessment Upper Extremity Assessment Upper Extremity Assessment: LUE deficits/detail LUE Deficits / Details: grossly 3/5, max difficulty manipulating toiletry containter/lids, stronger promixal than distal. Reports sensation intact. LUE Coordination: decreased fine motor;decreased gross motor   Lower  Extremity Assessment Lower Extremity Assessment: Defer to PT evaluation LLE Deficits / Details: MMT revealed 3+/5 for hip flexion; 4/5 for knee  flexion, knee extension and ankle DF       Communication Communication Communication: No difficulties;Expressive difficulties;Other (comment) (slightly slurred speech)   Cognition Arousal/Alertness: Lethargic;Awake/alert Behavior During Therapy: Flat affect Overall Cognitive Status: Impaired/Different from baseline Area of Impairment: Attention;Safety/judgement;Problem solving                   Current Attention Level: Selective     Safety/Judgement: Decreased awareness of safety;Decreased awareness of deficits   Problem Solving: Slow processing     General Comments       Exercises Exercises: Other exercises Other Exercises Other Exercises: Issued therapy hand ball and instructed in use.   Shoulder Instructions      Home Living Family/patient expects to be discharged to:: Private residence Living Arrangements: Parent Available Help at Discharge: Family;Available 24 hours/day Type of Home: House Home Access: Stairs to enter [employer] of Steps: 1   Home Layout: Two level Alternate Level Stairs-Number of Steps: flight Alternate Level Stairs-Rails: Right;Left;Can reach both Bathroom Shower/Tub: Tub/shower unit         Home Equipment: Environmental consultant - 2 wheels;Cane - single point;Other (comment)          Prior Functioning/Environment Level of Independence: Independent                 OT Problem List: Decreased strength;Decreased range of motion;Impaired balance (sitting and/or standing);Decreased coordination;Decreased cognition;Decreased safety awareness;Decreased knowledge of use of DME or AE;Decreased knowledge of precautions;Impaired tone;Impaired UE functional use      OT Treatment/Interventions: Self-care/ADL training;Therapeutic exercise;Neuromuscular education;DME and/or AE instruction;Therapeutic activities;Cognitive remediation/compensation;Patient/family education;Balance training    OT Goals(Current goals can be found in the care  plan section) Acute Rehab OT Goals Patient Stated Goal: return to PLOF OT Goal Formulation: With patient Time For Goal Achievement: 10/30/16 Potential to Achieve Goals: Good ADL Goals Pt Will Perform Grooming: with modified independence;standing Pt Will Perform Upper Body Bathing: with modified independence;sitting Pt Will Perform Lower Body Bathing: with modified independence;sit to/from stand Pt Will Perform Upper Body Dressing: with modified independence;sitting Pt Will Perform Lower Body Dressing: with modified independence;sit to/from stand Pt Will Transfer to Toilet: with modified independence;ambulating Pt Will Perform Toileting - Clothing Manipulation and hygiene: with modified independence;sit to/from stand Pt Will Perform Tub/Shower Transfer: with supervision;Tub transfer;ambulating;3 in 1 Pt/caregiver will Perform Home Exercise Program: Increased strength;Left upper extremity;With theraband;With theraputty;With written HEP provided;With Supervision  OT Frequency: Min 3X/week   Barriers to D/C:            Co-evaluation              AM-PAC PT "6 Clicks" Daily Activity     Outcome Measure Help from another person eating meals?: A Little Help from another person taking care of personal grooming?: A Little Help from another person toileting, which includes using toliet, bedpan, or urinal?: A Little Help from another person bathing (including washing, rinsing, drying)?: A Lot Help from another person to put on and taking off regular upper body clothing?: A Lot Help from another person to put on and taking off regular lower body clothing?: A Lot 6 Click Score: 15   End of Session    Activity Tolerance: Patient tolerated treatment well Patient left: in bed;with call bell/phone within reach;with bed alarm set  OT Visit Diagnosis: Unsteadiness on feet (R26.81);Ataxia, unspecified (R27.0);Other symptoms and signs involving cognitive  function;Hemiplegia and  hemiparesis Hemiplegia - Right/Left: Left Hemiplegia - dominant/non-dominant: Non-Dominant                Time: 1515-1540 OT Time Calculation (min): 25 min Charges:  OT General Charges $OT Visit: 1 Visit OT Evaluation $OT Eval Low Complexity: 1 Low OT Treatments $Self Care/Home Management : 8-22 mins G-Codes:       Hortencia Pilar 10/16/2016, 4:12 PM

## 2016-10-16 NOTE — Evaluation (Signed)
Physical Therapy Evaluation Patient Details Name: Sarah [name] MRN: [id number] DOB: [birth year] Today's Date: 10/16/2016   History of Present Illness  Pt is a 71 y/o female admitted secondary to L facial droop and L sided weakness (UE>LE). MRI revealed a distal R MCA occlusion. PMH including but not limited to HTN, DM, HLD.  Clinical Impression  Pt presented supine in bed with HOB elevated, awake and willing to participate in therapy session. Prior to admission, pt reported that she was independent with all functional mobility and ADLs. Pt lives with her mother in a two level house. Her sister from [place] is in town and will be staying with the pt until she no longer needs help. Pt ambulated within her room with min A with 1HHA on R. Pt very unsteady and required constant assist for stability. She was Pt demonstrated weakness on L side (UE>LE). Pt would continue to benefit from skilled physical therapy services at this time while admitted and after d/c to address the below listed limitations in order to improve overall safety and independence with functional mobility.     Follow Up Recommendations Supervision/Assistance - 24 hour;CIR    Equipment Recommendations  Other (comment) (standard cane vs quad cane?)    Recommendations for Other Services Rehab consult     Precautions / Restrictions Precautions Precautions: Fall Restrictions Weight Bearing Restrictions: No      Mobility  Bed Mobility Overal bed mobility: Needs Assistance Bed Mobility: Supine to Sit;Sit to Supine     Supine to sit: Min guard Sit to supine: Min guard   General bed mobility comments: increased time and effort, use of bed rails, min guard for safety  Transfers Overall transfer level: Needs assistance Equipment used: None Transfers: Sit to/from Stand Sit to Stand: Min guard         General transfer comment: increased time and effort, close min guard for  safety  Ambulation/Gait Ambulation/Gait assistance: Min assist Ambulation Distance (Feet): 40 Feet Assistive device: 1 person hand held assist Gait Pattern/deviations: Step-to pattern;Step-through pattern;Decreased step length - right;Decreased step length - left;Decreased stride length;Narrow base of support;Drifts right/left Gait velocity: decreased Gait velocity interpretation: Below normal speed for age/gender General Gait Details: pt with some inattention to L side and bumping into objects occasionally on L; required constant min A for stability and safety  Stairs            Wheelchair Mobility    Modified Rankin (Stroke Patients Only) Modified Rankin (Stroke Patients Only) Pre-Morbid Rankin Score: No symptoms Modified Rankin: Moderate disability     Balance Overall balance assessment: Needs assistance Sitting-balance support: Feet supported Sitting balance-Leahy Scale: Good Sitting balance - Comments: able to don/doff socks sitting EOB with supervision   Standing balance support: During functional activity;No upper extremity supported Standing balance-Leahy Scale: Fair Standing balance comment: static standing balance is fair with min guard; dynamic is poor                             Pertinent Vitals/Pain Pain Assessment: No/denies pain    Home Living Family/patient expects to be discharged to:: Private residence Living Arrangements: Parent Available Help at Discharge: Family;Available 24 hours/day Type of Home: House Home Access: Stairs to enter   [employer] of Steps: 1 Home Layout: Two level Home Equipment: Walker - 2 wheels;Cane - single point;Other (comment) (pt's mother uses the cane)      Prior Function Level of Independence: Independent  Hand Dominance   Dominant Hand: Right    Extremity/Trunk Assessment   Upper Extremity Assessment Upper Extremity Assessment: Defer to OT evaluation    Lower  Extremity Assessment Lower Extremity Assessment: LLE deficits/detail LLE Deficits / Details: MMT revealed 3+/5 for hip flexion; 4/5 for knee flexion, knee extension and ankle DF       Communication   Communication: No difficulties  Cognition Arousal/Alertness: Awake/alert Behavior During Therapy: Flat affect Overall Cognitive Status: Impaired/Different from baseline Area of Impairment: Safety/judgement                         Safety/Judgement: Decreased awareness of safety;Decreased awareness of deficits            General Comments      Exercises     Assessment/Plan    PT Assessment Patient needs continued PT services  PT Problem List Decreased strength;Decreased balance;Decreased mobility;Decreased coordination;Decreased knowledge of use of DME;Decreased safety awareness;Decreased knowledge of precautions       PT Treatment Interventions DME instruction;Gait training;Stair training;Functional mobility training;Therapeutic activities;Therapeutic exercise;Balance training;Neuromuscular re-education;Cognitive remediation;Patient/family education    PT Goals (Current goals can be found in the Care Plan section)  Acute Rehab PT Goals Patient Stated Goal: return to PLOF PT Goal Formulation: With patient/family Time For Goal Achievement: 10/30/16 Potential to Achieve Goals: Good    Frequency Min 4X/week   Barriers to discharge        Co-evaluation               AM-PAC PT "6 Clicks" Daily Activity  Outcome Measure Difficulty turning over in bed (including adjusting bedclothes, sheets and blankets)?: A Little Difficulty moving from lying on back to sitting on the side of the bed? : A Little Difficulty sitting down on and standing up from a chair with arms (e.g., wheelchair, bedside commode, etc,.)?: A Little Help needed moving to and from a bed to chair (including a wheelchair)?: A Little Help needed walking in hospital room?: A Little Help needed  climbing 3-5 steps with a railing? : A Lot 6 Click Score: 17    End of Session Equipment Utilized During Treatment: Gait belt Activity Tolerance: Patient tolerated treatment well Patient left: in bed;with call bell/phone within reach;with bed alarm set;with family/visitor present Nurse Communication: Mobility status PT Visit Diagnosis: Other abnormalities of gait and mobility (R26.89);Other symptoms and signs involving the nervous system (R29.898)    Time: 7416-3845 PT Time Calculation (min) (ACUTE ONLY): 19 min   Charges:   PT Evaluation $PT Eval Moderate Complexity: 1 Mod     PT G Codes:        [place], PT, DPT [place] 10/16/2016, 3:51 PM

## 2016-10-16 NOTE — Progress Notes (Signed)
Patient returned from XRay

## 2016-10-17 DIAGNOSIS — I6300 Cerebral infarction due to thrombosis of unspecified precerebral artery: Secondary | ICD-10-CM

## 2016-10-17 LAB — GLUCOSE, CAPILLARY
Glucose-Capillary: 91 mg/dL (ref 65–99)
Glucose-Capillary: 125 mg/dL — ABNORMAL HIGH (ref 65–99)
Glucose-Capillary: 63 mg/dL — ABNORMAL LOW (ref 65–99)
Glucose-Capillary: 209 mg/dL — ABNORMAL HIGH (ref 65–99)

## 2016-10-17 MED ORDER — AMOXICILLIN-POT CLAVULANATE 500-125 MG PO TABS
1.0000 | ORAL_TABLET | Freq: Three times a day (TID) | ORAL | Status: DC
  Administered 2016-10-17 – 2016-10-19 (×7): 500 mg via ORAL
  Filled 2016-10-17 (×8): qty 1

## 2016-10-17 MED ORDER — HYDROCODONE-ACETAMINOPHEN 5-325 MG PO TABS
1.0000 | ORAL_TABLET | Freq: Four times a day (QID) | ORAL | Status: DC | PRN
  Administered 2016-10-17: 2 via ORAL
  Filled 2016-10-17: qty 2
  Filled 2016-10-17: qty 1

## 2016-10-17 NOTE — Progress Notes (Signed)
Physical Therapy Treatment Patient Details Name: Sarah [name] MRN: [id number] DOB: Mar 01, 1945 Today's Date: 10/17/2016    History of Present Illness Pt is a 71 y/o female admitted secondary to L facial droop and L sided weakness (UE>LE). MRI revealed a distal R MCA occlusion. PMH including but not limited to HTN, DM, HLD.    PT Comments    Patient is making progress toward mobiltiy goals. Initiated gait training with SPC this session. Pt required min/mod A and experienced several LOBs and required assistance to recover. Pt demonstrated difficulty with motor planning and weakness of L UE/LE.  Pt continues to be a good candidate for CIR.   Follow Up Recommendations  Supervision/Assistance - 24 hour;CIR     Equipment Recommendations  Other (comment) (TBD)    Recommendations for Other Services Rehab consult     Precautions / Restrictions Precautions Precautions: Fall Restrictions Weight Bearing Restrictions: No    Mobility  Bed Mobility Overal bed mobility: Needs Assistance Bed Mobility: Supine to Sit;Sit to Supine     Supine to sit: Supervision Sit to supine: Supervision   General bed mobility comments: cues for to use L UE to assist with transitional movements  Transfers Overall transfer level: Needs assistance Equipment used: None;Straight cane Transfers: Sit to/from Stand Sit to Stand: Min guard         General transfer comment: cues to use L UE to assist in pushing up into standing; SPC upon standing for balance  Ambulation/Gait Ambulation/Gait assistance: Min assist;Mod assist Ambulation Distance (Feet): 100 Feet Assistive device: 1 person hand held assist;Straight cane (rail in hallway) Gait Pattern/deviations: Step-to pattern;Step-through pattern;Decreased step length - left;Decreased stride length;Narrow base of support;Decreased dorsiflexion - left Gait velocity: decreased   General Gait Details: several losses of balance requiring assist to  recover; gait training begin with SPC and min A with max cues for 3 point gait pattern; pt demonstrated difficutly with motor planning and at times leaving L LE behind if not given cues to follow through with step; rail in hallway used as well for balance and pt given multimodal cues for improved L knee flexion during swing phase, advancement of L LE, and L heel strike; pt tends to demonstrate circumduction of L LE when using [facility]    Stairs            Wheelchair Mobility    Modified Rankin (Stroke Patients Only) Modified Rankin (Stroke Patients Only) Pre-Morbid Rankin Score: No symptoms Modified Rankin: Moderate disability     Balance Overall balance assessment: Needs assistance Sitting-balance support: Feet supported Sitting balance-Leahy Scale: Good     Standing balance support: During functional activity;No upper extremity supported;Single extremity supported Standing balance-Leahy Scale: Fair Standing balance comment: static standing fair                            Cognition Arousal/Alertness: Awake/alert Behavior During Therapy: WFL for tasks assessed/performed Overall Cognitive Status: Impaired/Different from baseline Area of Impairment: Attention;Safety/judgement;Problem solving                   Current Attention Level: Selective     Safety/Judgement: Decreased awareness of safety;Decreased awareness of deficits   Problem Solving: Slow processing;Difficulty sequencing;Requires verbal cues        Exercises Other Exercises Other Exercises: L LE coordination exercises    General Comments        Pertinent Vitals/Pain Pain Assessment: No/denies pain    Home Living  Prior Function            PT Goals (current goals can now be found in the care plan section) Acute Rehab PT Goals Patient Stated Goal: return to PLOF PT Goal Formulation: With patient/family Time For Goal Achievement: 10/30/16 Potential to  Achieve Goals: Good Progress towards PT goals: Progressing toward goals    Frequency    Min 4X/week      PT Plan Current plan remains appropriate    Co-evaluation              AM-PAC PT "6 Clicks" Daily Activity  Outcome Measure  Difficulty turning over in bed (including adjusting bedclothes, sheets and blankets)?: A Little Difficulty moving from lying on back to sitting on the side of the bed? : A Little Difficulty sitting down on and standing up from a chair with arms (e.g., wheelchair, bedside commode, etc,.)?: A Little Help needed moving to and from a bed to chair (including a wheelchair)?: A Little Help needed walking in hospital room?: A Lot Help needed climbing 3-5 steps with a railing? : A Lot 6 Click Score: 16    End of Session Equipment Utilized During Treatment: Gait belt Activity Tolerance: Patient tolerated treatment well Patient left: in bed;with call bell/phone within reach;with bed alarm set;with family/visitor present Nurse Communication: Mobility status PT Visit Diagnosis: Other abnormalities of gait and mobility (R26.89);Other symptoms and signs involving the nervous system (R29.898)     Time: 9678-9381 PT Time Calculation (min) (ACUTE ONLY): 26 min  Charges:  $Gait Training: 23-37 mins                    G Codes:       Earney Navy, PTA Pager: [phone]     Darliss Cheney 10/17/2016, 4:38 PM

## 2016-10-17 NOTE — Progress Notes (Signed)
PROGRESS NOTE    DREAMA KUNA  ZOX:[id number] DOB: [birth year] DOA: 10/15/2016 PCP: [name], [name], MD   Brief Narrative: Sarah [name] is a 71 y.o. female, w hypertension, hyperlipidemia, dm2, w c/o left arm/hand weakness since yesterday am. Slight numbness, tingling.  Pt denies headache, vision change, cp, palp, sob, n/v.  Pt may have slight facial droop as well as slight dysarthria per family. Patient found to have an acute CVA on MRI   Assessment & Plan:   Principal Problem:   Stroke (cerebrum) ([place]) Active Problems:   Uncontrolled type 2 diabetes mellitus with insulin therapy ([place])   Hypertension   CVA MRI significant for right MCA segment occlusion with evidence of multifocal acute ischemia within MCA distribution. Echocardiogram significant for grade 2 diastolic heart dysfunction and an EF of 55-60% and PA peak pressure of 71mm. LDL of 98. A1C of 12.9. -PT/OT -neuro: asa/plavix; TEE loop recorder (message sent to cardiology to schedule)  Diabetes mellitus, type 2 -continue SSI -continue Lantus -meal coverage for >50% meal eaten -continue Metformin  Essential hypertension -continue amlodipine (hold today secondary to soft blood pressure), hydrochlorothiazide, irbesartan  Glaucoma Continue Travatan  Diastolic heart dysfunction EF of 55-60% with grade 2 diastolic dysfunction   DVT prophylaxis: SCDs Code Status: Full code Family Communication: None at bedside Disposition Plan: Discharge in 24-48 hours to SNF vs CIR   Consultants:   Neurology  Procedures:   Echocardiogram (10/6)  Antimicrobials:  None    Subjective: No complaints  Objective: Vitals:   10/16/16 2104 10/17/16 0100 10/17/16 0500 10/17/16 1024  BP: 132/68 125/62 135/69 (!) 113/52  Pulse: 78 70 75 75  Resp: 16 16 18 16   Temp: 98.9 F (37.2 C) 98.9 F (37.2 C) 98.7 F (37.1 C) 98.6 F (37 C)  TempSrc: Oral Oral Oral   SpO2: 97% 96% 98% 92%  Weight:      Height:         Intake/Output Summary (Last 24 hours) at 10/17/16 1101 Last data filed at 10/16/16 1852  Gross per 24 hour  Intake              360 ml  Output                0 ml  Net              360 ml   Filed Weights   10/15/16 1618 10/15/16 2251  Weight: 63.5 kg (140 lb) 63.1 kg (139 lb 3.2 oz)    Examination:  General exam: Appears calm and comfortable Respiratory system: Clear to auscultation. Respiratory effort normal. Cardiovascular system: S1 & S2 heard, RRR. No murmurs. Gastrointestinal system: Abdomen is nondistended, soft and nontender. Normal bowel sounds heard. Central nervous system: Alert and oriented. 3/5 LUE strength compared to 5/5 for RUE and bilateral L extremities. Extremities: No edema. No calf tenderness Skin: No cyanosis. No rashes Psychiatry: Judgement and insight appear normal. Mood & affect appropriate.     Data Reviewed: I have personally reviewed following labs and imaging studies  CBC:  Recent Labs Lab 10/15/16 2033 10/15/16 2042 10/16/16 0533  WBC 10.1  --  8.4  NEUTROABS 6.4  --   --   HGB 13.2 14.3 11.8*  HCT 39.8 42.0 36.1  MCV 92.8  --  92.6  PLT 363  --  811   Basic Metabolic Panel:  Recent Labs Lab 10/15/16 2033 10/15/16 2042 10/16/16 0030 10/16/16 0533  NA 138 139  --  138  K 4.0 3.8  --  3.4*  CL 104 103  --  106  CO2 24  --   --  24  GLUCOSE 335* 333*  --  230*  BUN 13 16  --  10  CREATININE 0.69 0.50  --  0.52  CALCIUM 9.7  --   --  8.9  MG  --   --  1.6*  --    GFR: Estimated Creatinine Clearance: 55 mL/min (by C-G formula based on SCr of 0.52 mg/dL). Liver Function Tests:  Recent Labs Lab 10/15/16 2033 10/16/16 0533  AST 14* 14*  ALT 11* 8*  ALKPHOS 93 73  BILITOT 0.4 0.4  PROT 7.0 5.8*  ALBUMIN 3.5 2.8*   No results for input(s): LIPASE, AMYLASE in the last 168 hours. No results for input(s): AMMONIA in the last 168 hours. Coagulation Profile:  Recent Labs Lab 10/15/16 2033  INR 0.95   Cardiac  Enzymes:  Recent Labs Lab 10/16/16 0030 10/16/16 0533  TROPONINI <0.03 <0.03   BNP (last 3 results) No results for input(s): PROBNP in the last 8760 hours. HbA1C:  Recent Labs  10/16/16 0533  HGBA1C 12.9*   CBG:  Recent Labs Lab 10/15/16 2232 10/16/16 0618 10/16/16 1121 10/16/16 1602 10/17/16 0635  GLUCAP 308* 163* 98 193* 91   Lipid Profile:  Recent Labs  10/16/16 0533  CHOL 156  HDL 37*  LDLCALC 98  TRIG 104  CHOLHDL 4.2   Thyroid Function Tests: No results for input(s): TSH, T4TOTAL, FREET4, T3FREE, THYROIDAB in the last 72 hours. Anemia Panel: No results for input(s): VITAMINB12, FOLATE, FERRITIN, TIBC, IRON, RETICCTPCT in the last 72 hours. Sepsis Labs: No results for input(s): PROCALCITON, LATICACIDVEN in the last 168 hours.  No results found for this or any previous visit (from the past 240 hour(s)).       Radiology Studies: Dg Chest 2 View  Result Date: 10/16/2016 CLINICAL DATA:  Stroke.  Left-sided weakness. EXAM: CHEST  2 VIEW COMPARISON:  02/06/2016 FINDINGS: The heart size and mediastinal contours are within normal limits. Both lungs are clear. The visualized skeletal structures are unremarkable. IMPRESSION: No active cardiopulmonary disease. Electronically Signed   By: Lucienne Capers M.D.   On: 10/16/2016 00:54   Ct Head Wo Contrast  Result Date: 10/15/2016 CLINICAL DATA:  Left hand numbness for 2 days.  No reported injury. EXAM: CT HEAD WITHOUT CONTRAST TECHNIQUE: Contiguous axial images were obtained from the base of the skull through the vertex without intravenous contrast. COMPARISON:  03/04/2007 brain MRI. FINDINGS: Brain: Focal white matter hypodensity in the posterior right frontal deep white matter, new since 03/04/2007. No evidence of parenchymal hemorrhage or extra-axial fluid collection. No mass lesion, mass effect, or midline shift. Cerebral volume is age appropriate. No ventriculomegaly. Vascular: No acute abnormality. Skull: No  evidence of calvarial fracture. Sinuses/Orbits: The visualized paranasal sinuses are essentially clear. Other:  The mastoid air cells are unopacified. IMPRESSION: 1. Focal white matter hypodensity in the posterior right frontal deep white matter, new since 03/04/2007 brain MRI, potentially representing a subacute white matter infarct. Brain MRI correlation could be obtained as clinically warranted. 2. No mass-effect.  No acute intracranial hemorrhage. Electronically Signed   By: Ilona Sorrel M.D.   On: 10/15/2016 20:32   Mr Brain Wo Contrast  Result Date: 10/16/2016 CLINICAL DATA:  Left arm and hand weakness.  Slurred screw. EXAM: MRI HEAD WITHOUT CONTRAST MRA HEAD WITHOUT CONTRAST TECHNIQUE: Multiplanar, multiecho pulse sequences of the brain  and surrounding structures were obtained without intravenous contrast. Angiographic images of the head were obtained using MRA technique without contrast. COMPARISON:  Head CT 10/15/2016 Brain MRI 03/04/2007 FINDINGS: MRI HEAD FINDINGS Brain: The midline structures are normal. There are numerous scattered foci of diffusion restriction with in the right MCA distribution. The largest area is in the right centrum semiovale and measures approximately 1.5 cm. There is no mass effect. No acute hemorrhage. There is multifocal cytotoxic edema associated with these lesions. There is multifocal hyperintense T2-weighted signal within the periventricular white matter, most often seen in the setting of chronic microvascular ischemia. Single focus of chronic microhemorrhage in the left caudate head. No hydrocephalus, age advanced atrophy or lobar predominant volume loss. No dural abnormality or extra-axial collection. Skull and upper cervical spine: The visualized skull base, calvarium, upper cervical spine and extracranial soft tissues are normal. Sinuses/Orbits: No fluid levels or advanced mucosal thickening. No mastoid effusion. Normal orbits. MRA HEAD FINDINGS Intracranial internal  carotid arteries: Normal. Anterior cerebral arteries: Normal. Middle cerebral arteries: The distal right M1 segment is occluded. This is distal to an early bifurcation. There is normal flow related enhancement distally within the right MCA distribution. The left middle cerebral artery is normal. Posterior communicating arteries: Absent bilaterally Posterior cerebral arteries: Normal. Basilar artery: Normal. Vertebral arteries: Codominant. Normal. Superior cerebellar arteries: Normal. Anterior inferior cerebellar arteries: Right-dominant Posterior inferior cerebellar arteries: Left dominant IMPRESSION: 1. Distal right middle cerebral artery M1 segment occlusion. 2. Multifocal acute ischemia within the right MCA distribution, with the largest area measuring 1.5 cm. 3. No acute hemorrhage or mass effect. Findings communicated via text page to Dr. Roland Rack at 2:06 a.m., 10/16/2016. Electronically Signed   By: Ulyses Jarred M.D.   On: 10/16/2016 02:06   Mr Jodene Nam Head Wo Contrast  Result Date: 10/16/2016 CLINICAL DATA:  Left arm and hand weakness.  Slurred screw. EXAM: MRI HEAD WITHOUT CONTRAST MRA HEAD WITHOUT CONTRAST TECHNIQUE: Multiplanar, multiecho pulse sequences of the brain and surrounding structures were obtained without intravenous contrast. Angiographic images of the head were obtained using MRA technique without contrast. COMPARISON:  Head CT 10/15/2016 Brain MRI 03/04/2007 FINDINGS: MRI HEAD FINDINGS Brain: The midline structures are normal. There are numerous scattered foci of diffusion restriction with in the right MCA distribution. The largest area is in the right centrum semiovale and measures approximately 1.5 cm. There is no mass effect. No acute hemorrhage. There is multifocal cytotoxic edema associated with these lesions. There is multifocal hyperintense T2-weighted signal within the periventricular white matter, most often seen in the setting of chronic microvascular ischemia. Single  focus of chronic microhemorrhage in the left caudate head. No hydrocephalus, age advanced atrophy or lobar predominant volume loss. No dural abnormality or extra-axial collection. Skull and upper cervical spine: The visualized skull base, calvarium, upper cervical spine and extracranial soft tissues are normal. Sinuses/Orbits: No fluid levels or advanced mucosal thickening. No mastoid effusion. Normal orbits. MRA HEAD FINDINGS Intracranial internal carotid arteries: Normal. Anterior cerebral arteries: Normal. Middle cerebral arteries: The distal right M1 segment is occluded. This is distal to an early bifurcation. There is normal flow related enhancement distally within the right MCA distribution. The left middle cerebral artery is normal. Posterior communicating arteries: Absent bilaterally Posterior cerebral arteries: Normal. Basilar artery: Normal. Vertebral arteries: Codominant. Normal. Superior cerebellar arteries: Normal. Anterior inferior cerebellar arteries: Right-dominant Posterior inferior cerebellar arteries: Left dominant IMPRESSION: 1. Distal right middle cerebral artery M1 segment occlusion. 2. Multifocal acute ischemia within the right  MCA distribution, with the largest area measuring 1.5 cm. 3. No acute hemorrhage or mass effect. Findings communicated via text page to Dr. Roland Rack at 2:06 a.m., 10/16/2016. Electronically Signed   By: Ulyses Jarred M.D.   On: 10/16/2016 02:06        Scheduled Meds: . amLODipine  10 mg Oral Daily  . aspirin EC  325 mg Oral Daily  . atorvastatin  40 mg Oral Daily  . clopidogrel  75 mg Oral Daily  . irbesartan  300 mg Oral Daily   And  . hydrochlorothiazide  25 mg Oral Daily  . insulin aspart  0-5 Units Subcutaneous QHS  . insulin aspart  0-9 Units Subcutaneous TID WC  . insulin aspart  10 Units Subcutaneous TID WC  . insulin glargine  60 Units Subcutaneous Q2200  . latanoprost  1 drop Both Eyes QHS  . metFORMIN  1,000 mg Oral BID WC  .  saccharomyces boulardii  250 mg Oral BID  . [START ON 10/18/2016] Vitamin D (Ergocalciferol)  50,000 Units Oral Once per day on Mon Thu   Continuous Infusions:    LOS: 2 days     Cordelia Poche, MD [facility] 10/17/2016, 11:01 AM Pager: [phone]  If 7PM-7AM, please contact night-coverage [email or web address] Password [email or web address] 10/17/2016, 11:01 AM

## 2016-10-17 NOTE — Progress Notes (Signed)
STROKE TEAM PROGRESS NOTE   HISTORY OF PRESENT ILLNESS (per record) Sarah [name] is a 71 y.o. female with a history of hypertension and diabetes who presents with left-sided weakness for 2 days. She states that she might have had some symptoms on 10/3, but got worse yesterday.due to still having symptoms today, she came into the emergency department.  Iin the ER, a CT was performed which shows a possible white matter infarct on the right  LKW: 10/3, unclear time tpa given?: no, outside of window   SUBJECTIVE (INTERVAL HISTORY) No family members present. The patient's left hand remains weak but is improving.Transthoracic echocardiogram was normal. Carotid ultrasound shows 40-59% right ICA stenosis.   OBJECTIVE Temp:  [98.6 F (37 C)-99.2 F (37.3 C)] 98.6 F (37 C) (10/07 1024) Pulse Rate:  [70-91] 91 (10/07 1238) Cardiac Rhythm: Normal sinus rhythm (10/07 0700) Resp:  [16-18] 16 (10/07 1024) BP: (113-147)/(52-69) 147/67 (10/07 1238) SpO2:  [92 %-98 %] 92 % (10/07 1024)  CBC:   Recent Labs Lab 10/15/16 2033 10/15/16 2042 10/16/16 0533  WBC 10.1  --  8.4  NEUTROABS 6.4  --   --   HGB 13.2 14.3 11.8*  HCT 39.8 42.0 36.1  MCV 92.8  --  92.6  PLT 363  --  188    Basic Metabolic Panel:   Recent Labs Lab 10/15/16 2033 10/15/16 2042 10/16/16 0030 10/16/16 0533  NA 138 139  --  138  K 4.0 3.8  --  3.4*  CL 104 103  --  106  CO2 24  --   --  24  GLUCOSE 335* 333*  --  230*  BUN 13 16  --  10  CREATININE 0.69 0.50  --  0.52  CALCIUM 9.7  --   --  8.9  MG  --   --  1.6*  --     Lipid Panel:     Component Value Date/Time   CHOL 156 10/16/2016 0533   TRIG 104 10/16/2016 0533   HDL 37 (L) 10/16/2016 0533   CHOLHDL 4.2 10/16/2016 0533   VLDL 21 10/16/2016 0533   LDLCALC 98 10/16/2016 0533   HgbA1c:  Lab Results  Component Value Date   HGBA1C 12.9 (H) 10/16/2016   Urine Drug Screen:     Component Value Date/Time   LABOPIA NONE DETECTED 10/15/2016  2053   COCAINSCRNUR NONE DETECTED 10/15/2016 2053   LABBENZ NONE DETECTED 10/15/2016 2053   AMPHETMU NONE DETECTED 10/15/2016 2053   THCU NONE DETECTED 10/15/2016 2053   LABBARB NONE DETECTED 10/15/2016 2053    Alcohol Level     Component Value Date/Time   [facility] <10 10/15/2016 2033    IMAGING   Dg Chest 2 View 10/16/2016 IMPRESSION:  No active cardiopulmonary disease.     Ct Head Wo Contrast 10/15/2016 IMPRESSION:  1. Focal white matter hypodensity in the posterior right frontal deep white matter, new since 03/04/2007 brain MRI, potentially representing a subacute white matter infarct. Brain MRI correlation could be obtained as clinically warranted.  2. No mass-effect.  No acute intracranial hemorrhage.     Mr Jodene [name] Head Wo Contrast 10/16/2016  IMPRESSION:  1. Distal right middle cerebral artery M1 segment occlusion.  2. Multifocal acute ischemia within the right MCA distribution, with the largest area measuring 1.5 cm.  3. No acute hemorrhage or mass effect.      Transthoracic Echocardiogram - Normal   Bilateral Carotid Dopplers - 40-59% right ICA and 1-39% left ICA  stenosis  PHYSICAL EXAM Vitals:   10/17/16 0100 10/17/16 0500 10/17/16 1024 10/17/16 1238  BP: 125/62 135/69 (!) 113/52 (!) 147/67  Pulse: 70 75 75 91  Resp: 16 18 16    Temp: 98.9 F (37.2 C) 98.7 F (37.1 C) 98.6 F (37 C)   TempSrc: Oral Oral    SpO2: 96% 98% 92%   Weight:      Height:       Pleasant middle aged lady not in distress. . Afebrile. Head is nontraumatic. Neck is supple without bruit.    Cardiac exam no murmur or gallop. Lungs are clear to auscultation. Distal pulses are well felt. Neurological Exam :  Awake alert oriented x 3 normal speech and language. Mild left lower face asymmetry. Tongue midline. Mild LUE drift.LUE proximally 3/5 but distally marked weakness on left. Orbits right over left upper extremity. Severe  left grip weakness.. Normal sensation . Normal coordination.Gait  deferred  ASSESSMENT/PLAN Sarah [name] is a 71 y.o. female with history of diabetes mellitus, ongoing tobacco use, hyperlipidemia, hypertension,  presenting with left-sided weakness. She did not receive IV t-PA due to late presentation.  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  Resultant  LUE weakness  CT head - Focal white matter hypodensity in the posterior right frontal deep white matter,  MRI head - Multifocal acute ischemia within the right MCA distribution, with the largest area measuring 1.5 cm.   MRA head - Distal right middle cerebral artery M1 segment occlusion.   Carotid Doppler - 40-59% right ICA  2D Echo - EF 55-60%. No cardiac source of emboli identified.  LDL - 98  HgbA1c - 12.9  VTE prophylaxis - SCDs Diet heart healthy/carb modified Room service appropriate? Yes; Fluid consistency: Thin Diet NPO time specified Except for: Sips with Meds  aspirin 81 mg daily prior to admission, now on aspirin 325 mg daily and clopidogrel 75 mg daily  Patient counseled to be compliant with her antithrombotic medications  Ongoing aggressive stroke risk factor management  Therapy recommendations: - pending  Disposition:  Pending  Hypertension  Stable  Permissive hypertension (OK if < 220/120) but gradually normalize in 5-7 days  Long-term BP goal normotensive  Hyperlipidemia  Home meds:  Lipitor 20 mg daily resumed in hospital  LDL 98, goal < 70  Increase Lipitor to 40 mg daily  Continue statin at discharge  Diabetes  HgbA1c 12.9, goal < 7.0  Uncontrolled  Other Stroke Risk Factors  Advanced age  Cigarette smoker - advised to stop smoking  ETOH use, advised to drink no more than 1 drink per day.  Overweight, Body mass index is 27.19 kg/m., recommend weight loss, diet and exercise as appropriate    Other Active Problems  Mild hypokalemia - 3.4  Hospital day # 2  Mikey Bussing PA-C [facility] Pager [phone] 10/17/2016, 12:40 PM I have personally examined this patient, reviewed notes, independently viewed imaging studies, participated in medical decision making and plan of care.ROS completed by me personally and pertinent positives fully documented  I have made any additions or clarifications directly to the above note.  She presented with embolic right MCA branch infarct and stroke work up is pending. Recommend dual antiplatelet therapy x 3 months followed by Plavix alone. Recommend TEE and loop recorder  discussed with Dr Lonny Prude. Follow-up as an outpatient in stroke clinic in 6 weeks. Stroke team will sign off. Kindly call for questions.   Pramod  Leonie Man, [place] Pager: [phone] 10/17/2016 12:40 PM    To contact Stroke Continuity provider, please refer to [email or web address] After hours, contact General Neurology

## 2016-10-17 NOTE — Progress Notes (Addendum)
Patient reports she is supposed to return to her dentist on Wednesday, Oct. 10th for tooth extraction and she has an eye procedure this coming Friday, October 12th. Reassurance given to patient that assistance will be given on Monday to determine her plan of care and scheduling this week. Dr.Netty informed of c/o pain and Rx's from dentist had not been recorded on med.rec. or filled by patient prior to admission.

## 2016-10-18 ENCOUNTER — Encounter (HOSPITAL_COMMUNITY): Payer: Self-pay | Admitting: *Deleted

## 2016-10-18 ENCOUNTER — Inpatient Hospital Stay (HOSPITAL_COMMUNITY): Payer: Medicare Other

## 2016-10-18 ENCOUNTER — Encounter (HOSPITAL_COMMUNITY)
Admission: EM | Disposition: A | Discharge status: Rehabilitation Facility | Payer: Self-pay | Source: Home / Self Care | Attending: Family Medicine

## 2016-10-18 DIAGNOSIS — I639 Cerebral infarction, unspecified: Secondary | ICD-10-CM

## 2016-10-18 DIAGNOSIS — I119 Hypertensive heart disease without heart failure: Secondary | ICD-10-CM

## 2016-10-18 DIAGNOSIS — I10 Essential (primary) hypertension: Secondary | ICD-10-CM

## 2016-10-18 DIAGNOSIS — I519 Heart disease, unspecified: Secondary | ICD-10-CM

## 2016-10-18 DIAGNOSIS — E1165 Type 2 diabetes mellitus with hyperglycemia: Secondary | ICD-10-CM

## 2016-10-18 DIAGNOSIS — I63411 Cerebral infarction due to embolism of right middle cerebral artery: Principal | ICD-10-CM

## 2016-10-18 DIAGNOSIS — R29898 Other symptoms and signs involving the musculoskeletal system: Secondary | ICD-10-CM

## 2016-10-18 DIAGNOSIS — E785 Hyperlipidemia, unspecified: Secondary | ICD-10-CM

## 2016-10-18 DIAGNOSIS — Z794 Long term (current) use of insulin: Secondary | ICD-10-CM

## 2016-10-18 DIAGNOSIS — D62 Acute posthemorrhagic anemia: Secondary | ICD-10-CM

## 2016-10-18 DIAGNOSIS — I5189 Other ill-defined heart diseases: Secondary | ICD-10-CM

## 2016-10-18 DIAGNOSIS — I63511 Cerebral infarction due to unspecified occlusion or stenosis of right middle cerebral artery: Secondary | ICD-10-CM

## 2016-10-18 DIAGNOSIS — Z720 Tobacco use: Secondary | ICD-10-CM

## 2016-10-18 HISTORY — PX: TEE WITHOUT CARDIOVERSION: SHX5443

## 2016-10-18 LAB — VAS US CAROTID
LEFT ECA DIAS: -15 cm/s
LEFT VERTEBRAL DIAS: -10 cm/s
Left CCA dist dias: -13 cm/s
Left CCA dist sys: -122 cm/s
Left CCA prox dias: -18 cm/s
Left CCA prox sys: -141 cm/s
Left ICA dist dias: -16 cm/s
Left ICA dist sys: -71 cm/s
Left ICA prox dias: 21 cm/s
Left ICA prox sys: 130 cm/s
RIGHT ECA DIAS: -4 cm/s
RIGHT VERTEBRAL DIAS: 7 cm/s
Right CCA prox dias: 15 cm/s
Right CCA prox sys: 103 cm/s
Right cca dist sys: -98 cm/s

## 2016-10-18 LAB — GLUCOSE, CAPILLARY
Glucose-Capillary: 81 mg/dL (ref 65–99)
Glucose-Capillary: 131 mg/dL — ABNORMAL HIGH (ref 65–99)
Glucose-Capillary: 238 mg/dL — ABNORMAL HIGH (ref 65–99)
Glucose-Capillary: 126 mg/dL — ABNORMAL HIGH (ref 65–99)

## 2016-10-18 SURGERY — ECHOCARDIOGRAM, TRANSESOPHAGEAL
Anesthesia: Moderate Sedation

## 2016-10-18 MED ORDER — FENTANYL CITRATE (PF) 100 MCG/2ML IJ SOLN
INTRAMUSCULAR | Status: AC
  Filled 2016-10-18: qty 2

## 2016-10-18 MED ORDER — IOPAMIDOL (ISOVUE-370) INJECTION 76%
INTRAVENOUS | Status: AC
  Administered 2016-10-18: 50 mL
  Filled 2016-10-18: qty 50

## 2016-10-18 MED ORDER — LIVING WELL WITH DIABETES BOOK
Freq: Once | Status: AC
  Administered 2016-10-18: 10:00:00
  Filled 2016-10-18: qty 1

## 2016-10-18 MED ORDER — MIDAZOLAM HCL 10 MG/2ML IJ SOLN
INTRAMUSCULAR | Status: DC | PRN
  Administered 2016-10-18 (×2): 2 mg via INTRAVENOUS

## 2016-10-18 MED ORDER — LIDOCAINE VISCOUS 2 % MT SOLN
OROMUCOSAL | Status: DC | PRN
  Administered 2016-10-18: 10 mL via OROMUCOSAL

## 2016-10-18 MED ORDER — SODIUM CHLORIDE 0.9 % IV SOLN: INTRAVENOUS | Status: DC

## 2016-10-18 MED ORDER — MIDAZOLAM HCL 5 MG/ML IJ SOLN
INTRAMUSCULAR | Status: AC
  Filled 2016-10-18: qty 2

## 2016-10-18 MED ORDER — FENTANYL CITRATE (PF) 100 MCG/2ML IJ SOLN
INTRAMUSCULAR | Status: DC | PRN
  Administered 2016-10-18: 25 ug via INTRAVENOUS

## 2016-10-18 MED ORDER — LIDOCAINE VISCOUS 2 % MT SOLN
OROMUCOSAL | Status: AC
  Filled 2016-10-18: qty 15

## 2016-10-18 NOTE — Progress Notes (Signed)
STROKE TEAM PROGRESS NOTE   SUBJECTIVE (INTERVAL HISTORY) No family members present. Pt stated that today her left UE weaker than yesterday and her left hand not able to move comparing with yesterday she was able to squeeze balls. Her BP was soft over night, BP 100-120s. Discussed with Dr. Illene Silver, will decrease her BP meds to allow permissive hypertension, check CTA neck and MRI repeat. Continue the plan of TEE and loop.    OBJECTIVE Temp:  [98.4 F (36.9 C)-98.9 F (37.2 C)] 98.5 F (36.9 C) (10/08 1328) Pulse Rate:  [74-103] 79 (10/08 1338) Cardiac Rhythm: Normal sinus rhythm (10/08 0700) Resp:  [15-32] 16 (10/08 1338) BP: (103-186)/(50-84) 112/51 (10/08 1338) SpO2:  [96 %-99 %] 96 % (10/08 1338)  CBC:   Recent Labs Lab 10/15/16 2033 10/15/16 2042 10/16/16 0533  WBC 10.1  --  8.4  NEUTROABS 6.4  --   --   HGB 13.2 14.3 11.8*  HCT 39.8 42.0 36.1  MCV 92.8  --  92.6  PLT 363  --  409    Basic Metabolic Panel:   Recent Labs Lab 10/15/16 2033 10/15/16 2042 10/16/16 0030 10/16/16 0533  NA 138 139  --  138  K 4.0 3.8  --  3.4*  CL 104 103  --  106  CO2 24  --   --  24  GLUCOSE 335* 333*  --  230*  BUN 13 16  --  10  CREATININE 0.69 0.50  --  0.52  CALCIUM 9.7  --   --  8.9  MG  --   --  1.6*  --     Lipid Panel:     Component Value Date/Time   CHOL 156 10/16/2016 0533   TRIG 104 10/16/2016 0533   HDL 37 (L) 10/16/2016 0533   CHOLHDL 4.2 10/16/2016 0533   VLDL 21 10/16/2016 0533   LDLCALC 98 10/16/2016 0533   HgbA1c:  Lab Results  Component Value Date   HGBA1C 12.9 (H) 10/16/2016   Urine Drug Screen:     Component Value Date/Time   LABOPIA NONE DETECTED 10/15/2016 2053   COCAINSCRNUR NONE DETECTED 10/15/2016 2053   LABBENZ NONE DETECTED 10/15/2016 2053   AMPHETMU NONE DETECTED 10/15/2016 2053   THCU NONE DETECTED 10/15/2016 2053   LABBARB NONE DETECTED 10/15/2016 2053    Alcohol Level     Component Value Date/Time   ETH <10 10/15/2016 2033     IMAGING I have personally reviewed the radiological images below and agree with the radiology interpretations.  Ct Head Wo Contrast 10/15/2016 IMPRESSION:  1. Focal white matter hypodensity in the posterior right frontal deep white matter, new since 03/04/2007 brain MRI, potentially representing a subacute white matter infarct. Brain MRI correlation could be obtained as clinically warranted.  2. No mass-effect.  No acute intracranial hemorrhage.   Mr Jodene Nam Head Wo Contrast 10/16/2016  IMPRESSION:  1. Distal right middle cerebral artery M1 segment occlusion.  2. Multifocal acute ischemia within the right MCA distribution, with the largest area measuring 1.5 cm.  3. No acute hemorrhage or mass effect.   TTE - Left ventricle: The cavity size was normal. There was moderate   focal basal hypertrophy of the septum. Systolic function was   normal. The estimated ejection fraction was in the range of 55%   to 60%. Wall motion was normal; there were no regional wall   motion abnormalities. Features are consistent with a pseudonormal   left ventricular filling pattern, with concomitant  abnormal   relaxation and increased filling pressure (grade 2 diastolic   dysfunction). Doppler parameters are consistent with high   ventricular filling pressure. - Aortic valve: Transvalvular velocity was within the normal range.   There was no stenosis. There was no regurgitation. - Mitral valve: Transvalvular velocity was within the normal range.   There was no evidence for stenosis. There was trivial   regurgitation. - Right ventricle: The cavity size was normal. Wall thickness was   normal. Systolic function was normal. - Atrial septum: No defect or patent foramen ovale was identified. - Tricuspid valve: There was trivial regurgitation. - Pulmonary arteries: Systolic pressure was within the normal   range. PA peak pressure: 16 mm Hg (S).  Bilateral Carotid Dopplers - 40-59% right ICA and 1-39% left  ICA stenosis  TEE LA, LA appendage without masses No PFO by color doppler or with injection of agitated saline  TV normal  AV mildly thickened. MV normal   LVEF normal Mild fixed atherosclerotic plaque in thoracic aorta    CTA neck pending  MRI head limited pending   PHYSICAL EXAM Vitals:   10/18/16 1315 10/18/16 1320 10/18/16 1328 10/18/16 1338  BP: (!) 186/84 (!) 134/58 (!) 118/50 (!) 112/51  Pulse: 99 (!) 103 92 79  Resp: (!) 32 (!) 28 (!) 25 16  Temp:   98.5 F (36.9 C)   TempSrc:   Oral   SpO2: 99% 98% 97% 96%  Weight:      Height:        Temp:  [98.4 F (36.9 C)-98.9 F (37.2 C)] 98.5 F (36.9 C) (10/08 1328) Pulse Rate:  [74-103] 79 (10/08 1338) Resp:  [15-32] 16 (10/08 1338) BP: (103-186)/(50-84) 112/51 (10/08 1338) SpO2:  [96 %-99 %] 96 % (10/08 1338)  General - Well nourished, well developed, Felt sad and crying during the round.  Ophthalmologic - Sharp disc margins OU.  Cardiovascular - Regular rate and rhythm.  Mental Status -  Level of arousal and orientation to time, place, and person were intact. Language including expression, naming, repetition, comprehension was assessed and found intact. Fund of Knowledge was assessed and was intact.  Cranial Nerves II - XII - II - Visual field intact OU. III, IV, VI - Extraocular movements intact. V - Facial sensation intact bilaterally. VII - Left facial droop. VIII - Hearing & vestibular intact bilaterally. X - Palate elevates symmetrically. XI - Chin turning & shoulder shrug intact bilaterally. XII - Tongue protrusion intact.  Motor Strength - The patient's strength was normal in all extremities except left upper extremity 4/5 proximal and 3/5 wrist extension and 0/5 hand grip and pronator drift was present on left.  Bulk was normal and fasciculations were absent.   Motor Tone - Muscle tone was assessed at the neck and appendages and was normal.  Reflexes - The patient's reflexes were 1+ in all  extremities and she had no pathological reflexes.  Sensory - Light touch, temperature/pinprick were assessed and were symmetrical.    Coordination - The patient had normal movements in the arms and legs with no ataxia or dysmetria.  Tremor was absent.  Gait and Station - deferred.   ASSESSMENT/PLAN [name] is a 71 y.o. female with history of diabetes mellitus, ongoing tobacco use, hyperlipidemia, hypertension,  presenting with left-sided weakness. She did not receive IV t-PA due to late presentation. 10/18/16 with worsening left sided weakness.   Stroke:  Right middle cerebral artery strokes - embolic - source  unknown, cardioembolic vs. A-A emboli. Risk factor including uncontrolled diabetes, HLD, smoker  Resultant  LUE weakness, left facial droop  CT head - Focal white matter hypodensity in the posterior right frontal deep white matter  MRI head - Multifocal acute ischemia within the right MCA distribution, with the largest area measuring 1.5 cm.   MRA head - Distal right middle cerebral artery M1 segment occlusion.   Carotid Doppler - 40-59% right ICA  CTA neck pending  MRI brain repeat pending  2D Echo - EF 55-60%. No cardiac source of emboli identified.  TEE unremarkable  Recommend droop recorder further rule out A. fib  LDL - 98  HgbA1c - 12.9  VTE prophylaxis - SCDs Diet NPO time specified  aspirin 81 mg daily prior to admission, now on aspirin 325 mg daily and clopidogrel 75 mg daily. Continue DAPT for 3 months, and then Plavix alone.  Patient counseled to be compliant with her antithrombotic medications  Ongoing aggressive stroke risk factor management  Therapy recommendations: - CIR  Disposition:  Pending  Right carotid artery stenosis  Carotid Doppler right ICA 40-59% stenosis  CTA neck pending  Hypertension  Stable Permissive hypertension (OK if < 220/120) but gradually normalize in 5-7 days Overnight, BP on the low side BP meds  on hold BP monitoring closely  Hyperlipidemia  Home meds:  Lipitor 20 mg daily resumed in hospital  LDL 98, goal < 70  Increase Lipitor to 40 mg daily  Continue statin at discharge  Diabetes  HgbA1c 12.9, goal < 7.0  Uncontrolled  On Lantus and metformin  SSI  CBG monitoring  PMH occasion and close PCP follow-up  Tobacco abuse  Current smoker  Smoking cessation counseling provided  Pt is willing to quit  Other Stroke Risk Factors  Advanced age  ETOH use, advised to drink no more than 1 drink per day.  Other Active Problems  Mild hypokalemia - 3.4  Hospital day # 3  This patient is medically critical due to worsening left-sided weakness, and left facial droop. We have to do more further stroke workup to evaluate and modify stroke risk factors. Patient is at high risk for recurrent strokes and TIAs and neurological worsening. This patient's care requires constant monitoring of vital signs, hemodynamics, respiratory and cardiac monitoring, review of multiple databases, neurological assessment, discussion with family, other specialists and medical decision making of high complexity. I spent 35 min in taking care of this pt.   Rosalin Hawking, MD PhD Stroke Neurology 10/18/2016 2:06 PM   To contact Stroke Continuity provider, please refer to [email or web address] After hours, contact General Neurology

## 2016-10-18 NOTE — Consult Note (Signed)
           [facility] CM Primary Care Navigator  10/18/2016  Sarah [name] 07/29/1945 [id number]   Went to see patient at the bedside earlier to identify possible discharge needs but patient was off the unit and staff reports that she is in ENDO for a procedure.   Will attempt to meet with patient at another time when she is available in the room.    Addendum (10/19/16):  Went back to see patient at the bedside to identify possible discharge needs but RN reports that patient is in Cath Lab for a procedure (Loop Recorder Insertion).   Will attempt again to meet patient at another time when she is available in the room.     Addendum (10/20/16):  Went back to see patient at the bedside to identify possible discharge needs but she was already [facility] (CIR [phone]) per staff report.  Primary Care provider's office is listed doing transition of care (TOC).    For questions, please contact:  Dannielle Huh, BSN, RN- [facility] Primary Care Navigator  Telephone: [phone] [place]

## 2016-10-18 NOTE — Consult Note (Signed)
Physical Medicine and Rehabilitation Consult Reason for Consult: left-sided weakness and facial droop Referring Physician: [facility]   HPI: Sarah [name] is a 71 y.o. right handed female with history of hypertension, hyperlipidemia, diabetes mellitus and peripheral neuropathy and tobacco abuse.  Presented 10/15/2016 with left-sided weakness and facial droop. Per chart review and patient, patient lives with elderly mother. Independent prior to admission. 2 level home. Cranial CT reviewed, showing right frontal infarct.  Per report, focal white matter hypodensity in the posterior right frontal deep white matter new since 03/04/2007. MRI showed no acute hemorrhage or mass effect. Multifocal acute ischemia within the right MCA distribution with the largest area measuring 1.5 cm. Distal right middle cerebral artery M1 segment occlusion.Patient did not receive TPA. Echocardiogram with ejection fraction of 95% grade 2 diastolic dysfunction. Carotid Dopplers with 40-59% right ICA stenosis. Neurology consultant maintained on aspirin and Plavix for CVA prophylaxis. Await plan for TEE as well as loop recorder. Physical and occupational therapy evaluations completed with recommendations of physical medicine rehabilitation consult.   Review of Systems  Constitutional: Negative for chills and fever.  HENT: Negative for hearing loss and tinnitus.   Eyes: Negative for blurred vision and double vision.  Respiratory: Negative for cough and shortness of breath.   Cardiovascular: Negative for chest pain and palpitations.  Gastrointestinal: Positive for constipation. Negative for nausea and vomiting.  Genitourinary: Negative for dysuria, flank pain and hematuria.  Skin: Negative for rash.  Neurological: Positive for sensory change, focal weakness and weakness. Negative for seizures.  All other systems reviewed and are negative.  Past Medical History:  Diagnosis Date  . Boils   . Glaucoma   . Heart  murmur   . HTN (hypertension)   . Hx of adenomatous colonic polyps   . Hyperlipidemia   . Osteoporosis   . Pneumonia   . Type II or unspecified type diabetes mellitus without mention of complication, uncontrolled    Past Surgical History:  Procedure Laterality Date  . ABDOMINAL HYSTERECTOMY    . CARPAL TUNNEL RELEASE     right  . COLONOSCOPY  12-10-10   per Dr. Deatra Ina, clear, repeat in 7 yrs   . INCISION AND DRAINAGE PERIRECTAL ABSCESS N/A 09/26/2015   Procedure: IRRIGATION AND DEBRIDEMENT PERIRECTAL ABSCESS;  Surgeon: Mickeal Skinner, MD;  Location: [facility];  Service: General;  Laterality: N/A;  . KNEE ARTHROSCOPY     right  . ORIF ANKLE FRACTURE Right 03/24/2012   Procedure: OPEN REDUCTION INTERNAL FIXATION (ORIF) ANKLE FRACTURE;  Surgeon: Newt Minion, MD;  Location: [facility];  Service: Orthopedics;  Laterality: Right;  Open Reduction Internal Fixation Right Bimalleolar ankle fracture  . POLYPECTOMY    . TONSILLECTOMY     Family History  Problem Relation Age of Onset  . Diabetes Mother   . Hypertension Mother   . Stroke Father   . Colon cancer Neg Hx   . Esophageal cancer Neg Hx   . Rectal cancer Neg Hx   . Stomach cancer Neg Hx    Social History:  reports that she has been smoking.  She has never used smokeless tobacco. She reports that she drinks alcohol. She reports that she does not use drugs. Allergies: No Known Allergies Medications Prior to Admission  Medication Sig Dispense Refill  . albuterol (PROVENTIL HFA;VENTOLIN HFA) 108 (90 Base) MCG/ACT inhaler Inhale 2 puffs into the lungs every 6 (six) hours as needed for wheezing or shortness of breath. 1 Inhaler 0  .  amLODipine (NORVASC) 10 MG tablet Take 1 tablet (10 mg total) by mouth daily. 90 tablet 2  . aspirin EC 81 MG tablet Take 81 mg by mouth daily.    Marland Kitchen atorvastatin (LIPITOR) 20 MG tablet Take 1 tablet (20 mg total) by mouth daily. 90 tablet 1  . Insulin Glargine (LANTUS) 100 UNIT/ML Solostar Pen Use 60 Units  daily at the same time. May titrate up if FG >200. 15 mL 3  . Insulin Lispro (HUMALOG KWIKPEN) 200 UNIT/ML SOPN Inject 10 Units into the skin 3 (three) times daily with meals. 15 mL 1  . metFORMIN (GLUCOPHAGE) 1000 MG tablet Take 1 tablet (1,000 mg total) by mouth 2 (two) times daily with a meal. 180 tablet 1  . saccharomyces boulardii (FLORASTOR) 250 MG capsule Take 1 capsule (250 mg total) by mouth 2 (two) times daily. You can get a probiotic over the counter. 60 capsule 0  . TRAVATAN Z 0.004 % SOLN ophthalmic solution Place 1 drop into both eyes at bedtime.  11  . valsartan-hydrochlorothiazide (DIOVAN-HCT) 320-25 MG tablet Take 1 tablet by mouth daily. 90 tablet 2  . Vitamin D, Ergocalciferol, (DRISDOL) 50000 units CAPS capsule TAKE ONE CAPSULE BY MOUTH TWICE A WEEK ON MONDAY AND THURSDAY (Patient taking differently: TAKE 87564 CAPSULE BY MOUTH TWICE A WEEK ON Tuesday AND THURSDAY) 8 capsule 1  . Insulin Pen Needle 32G X 4 MM MISC Use up to 4x a day (Patient not taking: Reported on 10/15/2016) 200 each 11  . insulin regular human CONCENTRATED (HUMULIN R U-500 KWIKPEN) 500 UNIT/ML kwikpen Inject 70-85 Units into the skin 3 (three) times daily with meals. (Patient not taking: Reported on 10/15/2016) 4 pen 0  . Zoster Vac Recomb Adjuvanted [facility]) injection Inject 0.5 mLs into the muscle every 3 (three) months. (Patient not taking: Reported on 10/15/2016) 1 each 1    Home: Home Living Family/patient expects to be discharged to:: Private residence Living Arrangements: Parent Available Help at Discharge: Family, Available 24 hours/day Type of Home: House Home Access: Stairs to enter Technical brewer of Steps: 1 Home Layout: Two level Alternate Level Stairs-Number of Steps: flight Alternate Level Stairs-Rails: Right, Left, Can reach both Bathroom Shower/Tub: Tub/shower unit Home Equipment: Environmental consultant - 2 wheels, Cane - single point, Other (comment)  Functional History: Prior Function Level  of Independence: Independent Functional Status:  Mobility: Bed Mobility Overal bed mobility: Needs Assistance Bed Mobility: Supine to Sit, Sit to Supine Supine to sit: Supervision Sit to supine: Supervision General bed mobility comments: cues for to use L UE to assist with transitional movements Transfers Overall transfer level: Needs assistance Equipment used: None, Straight cane Transfers: Sit to/from Stand Sit to Stand: Min guard General transfer comment: cues to use L UE to assist in pushing up into standing; SPC upon standing for balance Ambulation/Gait Ambulation/Gait assistance: Min assist, Mod assist Ambulation Distance (Feet): 100 Feet Assistive device: 1 person hand held assist, Straight cane (rail in hallway) Gait Pattern/deviations: Step-to pattern, Step-through pattern, Decreased step length - left, Decreased stride length, Narrow base of support, Decreased dorsiflexion - left General Gait Details: several losses of balance requiring assist to recover; gait training begin with SPC and min A with max cues for 3 point gait pattern; pt demonstrated difficutly with motor planning and at times leaving L LE behind if not given cues to follow through with step; rail in hallway used as well for balance and pt given multimodal cues for improved L knee flexion during swing phase,  advancement of L LE, and L heel strike; pt tends to demonstrate circumduction of L LE when using SPC  Gait velocity: decreased Gait velocity interpretation: Below normal speed for age/gender    ADL: ADL Overall ADL's : Needs assistance/impaired Eating/Feeding: Set up, Sitting Grooming: Minimal assistance, Standing Grooming Details (indicate cue type and reason): assist for activities involving LUE Upper Body Bathing: Minimal assistance, Sitting Lower Body Bathing: Sit to/from stand, Moderate assistance Upper Body Dressing : Minimal assistance, Sitting Lower Body Dressing: Sit to/from stand, Moderate  assistance Toilet Transfer: Minimal assistance, Ambulation Toilet Transfer Details (indicate cue type and reason): HHA at times Toileting- Clothing Manipulation and Hygiene: Minimal assistance, Sit to/from stand Tub/ Shower Transfer: Moderate assistance, Tub transfer, Ambulation, 3 in 1 Functional mobility during ADLs: Minimal assistance (+1 HHA) General ADL Comments: Pt completed bed mobility, toilet transfer, grooming task at sink.  Cognition: Cognition Overall Cognitive Status: Impaired/Different from baseline Arousal/Alertness: Lethargic Orientation Level: Oriented X4 Attention: Selective Sustained Attention: Impaired Sustained Attention Impairment: Verbal complex Selective Attention: Impaired Selective Attention Impairment: Verbal basic, Verbal complex Memory: Appears intact Awareness: Impaired Awareness Impairment: Intellectual impairment Problem Solving: Impaired Problem Solving Impairment: Verbal complex Executive Function: Reasoning Reasoning: Impaired Reasoning Impairment: Verbal complex Safety/Judgment: Impaired Cognition Arousal/Alertness: Awake/alert Behavior During Therapy: WFL for tasks assessed/performed Overall Cognitive Status: Impaired/Different from baseline Area of Impairment: Attention, Safety/judgement, Problem solving Current Attention Level: Selective Safety/Judgement: Decreased awareness of safety, Decreased awareness of deficits Problem Solving: Slow processing, Difficulty sequencing, Requires verbal cues  Blood pressure 103/60, pulse 83, temperature 98.4 F (36.9 C), temperature source Oral, resp. rate 16, height 5' (1.524 m), weight 63.1 kg (139 lb 3.2 oz), SpO2 96 %. Physical Exam  Vitals reviewed. Constitutional: She is oriented to person, place, and time. She appears well-developed and well-nourished.  HENT:  Head: Normocephalic and atraumatic.  Eyes: EOM are normal. Right eye exhibits no discharge. Left eye exhibits no discharge.  Neck:  Normal range of motion. Neck supple. No thyromegaly present.  Cardiovascular: Normal rate, regular rhythm and normal heart sounds.   Respiratory: Effort normal and breath sounds normal. No respiratory distress.  GI: Soft. Bowel sounds are normal. She exhibits no distension.  Musculoskeletal: She exhibits no edema or tenderness.  Neurological: She is alert and oriented to person, place, and time.  Follows commands.  Fair awareness of deficits. Mtoor: RUE, B/l LE: 5/5 proximal to distal LUE: Shoulder abduction, elbow flex/ext 4-/5, distally 1/5 Sensation diminished to light touch distal LUE DTRs symmetric Left facial weakness  Skin: Skin is warm and dry.  Psychiatric: She has a normal mood and affect. Her behavior is normal. Thought content normal.    Results for orders placed or performed during the hospital encounter of 10/15/16 (from the past 24 hour(s))  Glucose, capillary     Status: None   Collection Time: 10/17/16  6:35 AM  Result Value Ref Range   Glucose-Capillary 91 65 - 99 mg/dL  Glucose, capillary     Status: Abnormal   Collection Time: 10/17/16 11:34 AM  Result Value Ref Range   Glucose-Capillary 125 (H) 65 - 99 mg/dL   Comment 1 Notify RN    Comment 2 Document in Chart   Glucose, capillary     Status: Abnormal   Collection Time: 10/17/16  4:49 PM  Result Value Ref Range   Glucose-Capillary 63 (L) 65 - 99 mg/dL   Comment 1 Notify RN    Comment 2 Document in Chart   Glucose, capillary  Status: Abnormal   Collection Time: 10/17/16  9:36 PM  Result Value Ref Range   Glucose-Capillary 209 (H) 65 - 99 mg/dL   Comment 1 Notify RN    Comment 2 Document in Chart    No results found.  Assessment/Plan: Diagnosis: Right MCA infarct Labs and images independently reviewed.  Records reviewed and summated above. Stroke: Continue secondary stroke prophylaxis and Risk Factor Modification listed below:   Antiplatelet therapy:   Blood Pressure Management:  Continue current  medication with prn's with permisive HTN per primary team Statin Agent:   Diabetes management:   Tobacco abuse:   LUE hemiparesis: fit for orthosis to prevent contractures (resting hand splint for day, wrist cock up splint at night) Motor recovery: Fluoxetine  1. Does the need for close, 24 hr/day medical supervision in concert with the patient's rehab needs make it unreasonable for this patient to be served in a less intensive setting? Yes 2. Co-Morbidities requiring supervision/potential complications: diastolic dysfunction (monitor for signs/symptoms of fluid overload), HTN (monitor and provide prns in accordance with increased physical exertion and pain), hyperlipidemia (cont meds), diabetes mellitus with peripheral neuropathy (Monitor in accordance with exercise and adjust meds as necessary), tobacco abuse (counsel), hypokalemia (continue to monitor and replete as necessary), ABLA (transfuse if necessary to ensure appropriate perfusion for increased activity tolerance) 3. Due to safety, disease management and patient education, does the patient require 24 hr/day rehab nursing? Yes 4. Does the patient require coordinated care of a physician, rehab nurse, PT (1-2 hrs/day, 5 days/week) and OT (1-2 hrs/day, 5 days/week) to address physical and functional deficits in the context of the above medical diagnosis(es)? Yes Addressing deficits in the following areas: balance, endurance, locomotion, strength, transferring, bathing, dressing, grooming, toileting and psychosocial support 5. Can the patient actively participate in an intensive therapy program of at least 3 hrs of therapy per day at least 5 days per week? Yes 6. The potential for patient to make measurable gains while on inpatient rehab is excellent 7. Anticipated functional outcomes upon discharge from inpatient rehab are supervision  with PT, supervision with OT, n/a with SLP. 8. Estimated rehab length of stay to reach the above functional  goals is: 6-10 days. 9. Anticipated D/C setting: Home 10. Anticipated post D/C treatments: HH therapy and Home excercise program 11. Overall Rehab/Functional Prognosis: good  RECOMMENDATIONS: This patient's condition is appropriate for continued rehabilitative care in the following setting: CIR after completion of medical workup Patient has agreed to participate in recommended program. Yes Note that insurance prior authorization may be required for reimbursement for recommended care.  Comment: Rehab Admissions Coordinator to follow up.  Delice Lesch, MD, ABPMR Lauraine Rinne J., PA-C 10/18/2016

## 2016-10-18 NOTE — Care Management Note (Signed)
Case Management Note  Patient Details  Name: Sarah [name] MRN: [id number] Date of Birth: [birth year]  Subjective/Objective:  Pt admitted with CVA. She is from home with her mother.             Action/Plan: Awaiting TEE and possible loop recorder today. PT recommending CIR. CM following for d/c disposition.   Expected Discharge Date:  10/19/16               Expected Discharge Plan:  [place]  In-House Referral:     Discharge planning Services  CM Consult  Post Acute Care Choice:    Choice offered to:     DME Arranged:    DME Agency:     HH Arranged:    HH Agency:     Status of Service:  In process, will continue to follow  If discussed at Long Length of Stay Meetings, dates discussed:    Additional Comments:  Pollie Friar, RN 10/18/2016, 10:55 AM

## 2016-10-18 NOTE — Progress Notes (Signed)
  Echocardiogram Echocardiogram Transesophageal has been performed.  Sarah [name] 10/18/2016, 1:39 PM

## 2016-10-18 NOTE — H&P (Deleted)
  The note originally documented on this encounter has been moved the the encounter in which it belongs.  

## 2016-10-18 NOTE — Progress Notes (Signed)
Inpatient Diabetes Program Recommendations  AACE/ADA: New Consensus Statement on Inpatient Glycemic Control (2015)  Target Ranges:  Prepandial:   less than 140 mg/dL      Peak postprandial:   less than 180 mg/dL (1-2 hours)      Critically ill patients:  140 - 180 mg/dL   Results for Sarah [name], Sarah [name] (MRN [id number]) as of 10/18/2016 11:57  Ref. Range 10/17/2016 06:35 10/17/2016 11:34 10/17/2016 16:49 10/17/2016 21:36 10/18/2016 06:28  Glucose-Capillary Latest Ref Range: 65 - 99 mg/dL 91 125 (H) 63 (L) 209 (H) 131 (H)   Results for Sarah [name], Sarah [name] (MRN [id number]) as of 10/18/2016 11:57  Ref. Range 10/16/2016 05:33  Hemoglobin A1C Latest Ref Range: 4.8 - 5.6 % 12.9 (H)   Review of Glycemic Control  Diabetes history: DM2 Outpatient Diabetes medications: Lantus 60 units QHS, Humalog 20 units TID with meals, Metformin 1000 mg BID (not taking Metformin) Current orders for Inpatient glycemic control: Lantus 60 units QHS, Novolog 0-9 units TID with meals, Novolog 0-5 units QHS, Metformin 1000 mg BID, Novolog 10 units TID with meals  Inpatient Diabetes Program Recommendations: HgbA1C: A1C 12.9% on 10/16/16 indicating an average glucose of 324 mg/dl over the past 2-3 months. Recommend patient follow up with PCP regarding DM control.  NOTE: Spoke with patient about diabetes and home regimen for diabetes control. Patient reports that she is followed by PCP for diabetes management and currently she takes Lantus 60 units QHS and Humalog 20 units TID with meals as an outpatient for diabetes control. Inquired about Metformin and patient reports that she is not taking Metformin because it causes her to have diarrhea. Patient states that she checks her glucose once a day in the mornings and over the past week it has ranged from 249-mid 300's mg/dl. Inquired about possibility of checking glucose more often and patient reports that her doctor has asked her to check 3 times per day and she is trying to  establish a habit of doing so but she forgets or doesn't always have her glucometer with her. Discussed A1C results (12.9% on 10/16/16) and explained that her current A1C indicates an average glucose of 324 mg/dl over the past 2-3 months. Discussed glucose and A1C goals. Discussed importance of checking CBGs and maintaining good CBG control to prevent long-term and short-term complications. Explained how hyperglycemia leads to damage within blood vessels which lead to the common complications seen with uncontrolled diabetes. Stressed to the patient the importance of improving glycemic control to prevent further complications from uncontrolled diabetes. Discussed impact of nutrition, exercise, stress, sickness, and medications on diabetes control. Discussed FreeStyle Libre (flash glucose monitoring device, sensor placed once every 10 days and reader device is scanned over sensor and provides glucose value). Patient states that she has seen commercials for it and is interested in the device.  Encouraged patient to talk with per PCP about the [facility] as a prescription will be required. Encouraged patient to check glucose 3-4 times per day (before meals and at bedtime) and to keep a log book of glucose readings and insulin taken which she will need to take to doctor appointments. Explained how her doctor can use the log book to continue to make insulin adjustments if needed. Informed patient she should be receiving Living Well with Diabetes book from RN and encouraged to read entire book when received. Patient verbalized understanding of information discussed and she states that she has no further questions at this time related to diabetes.  Thanks, Lelan Pons  Felton Clinton, RN, MSN, CDE Diabetes Coordinator Inpatient Diabetes Program [phone] (Team Pager)

## 2016-10-18 NOTE — Progress Notes (Addendum)
PROGRESS NOTE    [name]  RCV:[id number] DOB: Apr 21, 1945 DOA: 10/15/2016 PCP: [name], [name], MD   Brief Narrative: Sarah [name] is a 71 y.o. female, w hypertension, hyperlipidemia, dm2, w c/o left arm/hand weakness since yesterday am. Slight numbness, tingling.  Pt denies headache, vision change, cp, palp, sob, n/v.  Pt may have slight facial droop as well as slight dysarthria per family. Patient found to have an acute CVA on MRI   Assessment & Plan:   Principal Problem:   Stroke (cerebrum) ([place]) Active Problems:   Uncontrolled type 2 diabetes mellitus with insulin therapy (HCC)   Hypertension   Left arm weakness   Diastolic dysfunction   Benign essential HTN   Tobacco abuse   Acute blood loss anemia   CVA MRI significant for right MCA segment occlusion with evidence of multifocal acute ischemia within MCA distribution. Echocardiogram significant for grade 2 diastolic heart dysfunction and an EF of 55-60% and PA peak pressure of 23mm. LDL of 98. A1C of 12.9. Right ICA with 40-59% and left ICA with 1-39% stenosisWorsening symptoms. -PT/OT: CIR -neuro: asa/plavix; TEE loop recorder (message sent to cardiology to schedule), CTA neck -permissive hypertension -CTA neck -repeat MRI head  Diabetes mellitus, type 2 -continue SSI -continue Lantus -meal coverage for >50% meal eaten -continue Metformin  Essential hypertension -discontinue antihypertensives in setting of acute stroke  Glaucoma Continue Travatan  Diastolic heart dysfunction EF of 55-60% with grade 2 diastolic dysfunction  Dental abscess -augmentin -norco prn   DVT prophylaxis: SCDs Code Status: Full code Family Communication: None at bedside Disposition Plan: Discharge pending continued medical workup to SNF vs CIR   Consultants:   Neurology  Procedures:   Echocardiogram (10/6)  Antimicrobials:  None    Subjective: Dental pain improved  Objective: Vitals:   10/18/16 0124 10/18/16 0300 10/18/16 0856 10/18/16 0900  BP: 123/68 103/60 115/62 (!) 127/57  Pulse: 74 83  75  Resp: 16 16  18   Temp: 98.6 F (37 C) 98.4 F (36.9 C)  98.9 F (37.2 C)  TempSrc: Oral Oral  Oral  SpO2: 97% 96%  98%  Weight:      Height:       No intake or output data in the 24 hours ending 10/18/16 1015 Filed Weights   10/15/16 1618 10/15/16 2251  Weight: 63.5 kg (140 lb) 63.1 kg (139 lb 3.2 oz)    Examination:  General exam: Appears calm and comfortable HEENT: swelling of left lower gums with tenderness and no erythema Respiratory system: Clear to auscultation. Respiratory effort normal. Cardiovascular system: S1 & S2 heard, RRR. No murmurs. Gastrointestinal system: Abdomen is nondistended, soft and nontender. Normal bowel sounds heard. Central nervous system: Alert and oriented. 3/5 LUE strength with 0/5 grip strength compared to 5/5 for RUE and bilateral L extremities. Extremities: No edema. No calf tenderness Skin: No cyanosis. No rashes Psychiatry: Judgement and insight appear normal. Mood & affect appropriate.     Data Reviewed: I have personally reviewed following labs and imaging studies  CBC:  Recent Labs Lab 10/15/16 2033 10/15/16 2042 10/16/16 0533  WBC 10.1  --  8.4  NEUTROABS 6.4  --   --   HGB 13.2 14.3 11.8*  HCT 39.8 42.0 36.1  MCV 92.8  --  92.6  PLT 363  --  102   Basic Metabolic Panel:  Recent Labs Lab 10/15/16 2033 10/15/16 2042 10/16/16 0030 10/16/16 0533  NA 138 139  --  138  K  4.0 3.8  --  3.4*  CL 104 103  --  106  CO2 24  --   --  24  GLUCOSE 335* 333*  --  230*  BUN 13 16  --  10  CREATININE 0.69 0.50  --  0.52  CALCIUM 9.7  --   --  8.9  MG  --   --  1.6*  --    GFR: Estimated Creatinine Clearance: 55 mL/min (by C-G formula based on SCr of 0.52 mg/dL). Liver Function Tests:  Recent Labs Lab 10/15/16 2033 10/16/16 0533  AST 14* 14*  ALT 11* 8*  ALKPHOS 93 73  BILITOT 0.4 0.4  PROT 7.0 5.8*    ALBUMIN 3.5 2.8*   No results for input(s): LIPASE, AMYLASE in the last 168 hours. No results for input(s): AMMONIA in the last 168 hours. Coagulation Profile:  Recent Labs Lab 10/15/16 2033  INR 0.95   Cardiac Enzymes:  Recent Labs Lab 10/16/16 0030 10/16/16 0533  TROPONINI <0.03 <0.03   BNP (last 3 results) No results for input(s): PROBNP in the last 8760 hours. HbA1C:  Recent Labs  10/16/16 0533  HGBA1C 12.9*   CBG:  Recent Labs Lab 10/17/16 0635 10/17/16 1134 10/17/16 1649 10/17/16 2136 10/18/16 0628  GLUCAP 91 125* 63* 209* 131*   Lipid Profile:  Recent Labs  10/16/16 0533  CHOL 156  HDL 37*  LDLCALC 98  TRIG 104  CHOLHDL 4.2   Thyroid Function Tests: No results for input(s): TSH, T4TOTAL, FREET4, T3FREE, THYROIDAB in the last 72 hours. Anemia Panel: No results for input(s): VITAMINB12, FOLATE, FERRITIN, TIBC, IRON, RETICCTPCT in the last 72 hours. Sepsis Labs: No results for input(s): PROCALCITON, LATICACIDVEN in the last 168 hours.  No results found for this or any previous visit (from the past 240 hour(s)).       Radiology Studies: No results found.      Scheduled Meds: . amoxicillin-clavulanate  1 tablet Oral TID  . aspirin EC  325 mg Oral Daily  . atorvastatin  40 mg Oral Daily  . clopidogrel  75 mg Oral Daily  . insulin aspart  0-5 Units Subcutaneous QHS  . insulin aspart  0-9 Units Subcutaneous TID WC  . insulin aspart  10 Units Subcutaneous TID WC  . insulin glargine  60 Units Subcutaneous Q2200  . latanoprost  1 drop Both Eyes QHS  . living well with diabetes book   Does not apply Once  . metFORMIN  1,000 mg Oral BID WC  . saccharomyces boulardii  250 mg Oral BID  . Vitamin D (Ergocalciferol)  50,000 Units Oral Once per day on Mon Thu   Continuous Infusions:    LOS: 3 days     Cordelia Poche, MD [facility] 10/18/2016, 10:15 AM Pager: [phone]  If 7PM-7AM, please contact  night-coverage [email or web address] Password [email or web address] 10/18/2016, 10:15 AM

## 2016-10-18 NOTE — Progress Notes (Signed)
    [facility] has been requested to perform a transesophageal echocardiogram on Sarah [name] for stroke.  After careful review of history and examination, the risks and benefits of transesophageal echocardiogram have been explained including risks of esophageal damage, perforation (1:10,000 risk), bleeding, pharyngeal hematoma as well as other potential complications associated with conscious sedation including aspiration, arrhythmia, respiratory failure and death. Alternatives to treatment were discussed, questions were answered. Patient is willing to proceed.   She is scheduled for TEE with Dr. Harrington Challenger at 1300 today. Keep NPO.  Tami Lin [place], [place]  10/18/2016 9:02 AM

## 2016-10-18 NOTE — Interval H&P Note (Signed)
History and Physical Interval Note:  10/18/2016 12:29 PM  Sarah [name]  has presented today for surgery, with the diagnosis of stroke  The various methods of treatment have been discussed with the patient and family. After consideration of risks, benefits and other options for treatment, the patient has consented to  Procedure(s): TRANSESOPHAGEAL ECHOCARDIOGRAM (TEE) (N/A) as a surgical intervention .  The patient's history has been reviewed, patient examined, no change in status, stable for surgery.  I have reviewed the patient's chart and labs.  Questions were answered to the patient's satisfaction.     Dorris Carnes

## 2016-10-18 NOTE — Progress Notes (Addendum)
Inpatient Rehabilitation  Met with patient to discuss team's recommendation for IP Rehab.  Shared booklets, insurance verification letter, and answered questions briefly.  I have received insurance authorization from [facility].  Plan to follow for timing of medical readiness prior to admission.  Will follow up with patient and team tomorrow, in hopes of a potential admission.  Call if questions.   Carmelia Roller., CCC/SLP Admission Coordinator  [place]  Cell [phone]

## 2016-10-18 NOTE — Consult Note (Signed)
ELECTROPHYSIOLOGY CONSULT NOTE  Patient ID: Sarah [name] MRN: [id number], DOB/AGE: August 05, 1945   Admit date: 10/15/2016 Date of Consult: 10/18/2016  Primary Physician: [name], [name], MD Primary Cardiologist: none Reason for Consultation: Cryptogenic stroke - ; recommendations regarding Implantable Loop Recorder by dr. Leonie Man  History of Present Illness Sarah [name] was admitted on 10/15/2016 with left sided weakness intermittently for a couple days.  PMHx is HTN, HLD, DM, smoker, peripheral neuropathy.  They first developed symptoms while at home.  Imaging demonstrated Right middle cerebral artery strokes - embolic - source unknown .  she has undergone workup for stroke including echocardiogram and carotid dopplers.  The patient has been monitored on telemetry which has demonstrated sinus rhythm with no arrhythmias.  Inpatient stroke work-up is to be completed with a TEE.   Echocardiogram this admission demonstrated  Study Conclusions - Left ventricle: The cavity size was normal. There was moderate   focal basal hypertrophy of the septum. Systolic function was   normal. The estimated ejection fraction was in the range of 55%   to 60%. Wall motion was normal; there were no regional wall   motion abnormalities. Features are consistent with a pseudonormal   left ventricular filling pattern, with concomitant abnormal   relaxation and increased filling pressure (grade 2 diastolic   dysfunction). Doppler parameters are consistent with high   ventricular filling pressure. - Aortic valve: Transvalvular velocity was within the normal range.   There was no stenosis. There was no regurgitation. - Mitral valve: Transvalvular velocity was within the normal range.   There was no evidence for stenosis. There was trivial   regurgitation. - Right ventricle: The cavity size was normal. Wall thickness was   normal. Systolic function was normal. - Atrial septum: No defect or patent  foramen ovale was identified. - Tricuspid valve: There was trivial regurgitation. - Pulmonary arteries: Systolic pressure was within the normal   range. PA peak pressure: 16 mm Hg (S).  10/16/16: Carotid doppler Summary: Right: soft plaque throughout CCA and at origin ICA and ECA, creating a narrowing just past the ICA origin. 40-59% ICA stenosis. Left: mild soft plaque noted origin ICA. 1-39% ICA stenosis. Vertebral artery flow is antegrade.   Lab work is reviewed.   Prior to admission, the patient denies chest pain, shortness of breath, dizziness, palpitations, or syncope. Plans to CIR at discharge.  The patient has reported worsening LUE weakness today and is pending further imaging, no bed yet at rehab  EP has been asked to evaluate for placement of an implantable loop recorder to monitor for atrial fibrillation.     Past Medical History:  Diagnosis Date  . Boils   . Glaucoma   . Heart murmur   . HTN (hypertension)   . Hx of adenomatous colonic polyps   . Hyperlipidemia   . Osteoporosis   . Pneumonia   . Type II or unspecified type diabetes mellitus without mention of complication, uncontrolled      Surgical History:  Past Surgical History:  Procedure Laterality Date  . ABDOMINAL HYSTERECTOMY    . CARPAL TUNNEL RELEASE     right  . COLONOSCOPY  12-10-10   per Dr. Deatra Ina, clear, repeat in 7 yrs   . INCISION AND DRAINAGE PERIRECTAL ABSCESS N/A 09/26/2015   Procedure: IRRIGATION AND DEBRIDEMENT PERIRECTAL ABSCESS;  Surgeon: Mickeal Skinner, MD;  Location: [facility];  Service: General;  Laterality: N/A;  . KNEE ARTHROSCOPY     right  .  ORIF ANKLE FRACTURE Right 03/24/2012   Procedure: OPEN REDUCTION INTERNAL FIXATION (ORIF) ANKLE FRACTURE;  Surgeon: Newt Minion, MD;  Location: [facility];  Service: Orthopedics;  Laterality: Right;  Open Reduction Internal Fixation Right Bimalleolar ankle fracture  . POLYPECTOMY    . TONSILLECTOMY       Prescriptions Prior to  Admission  Medication Sig Dispense Refill Last Dose  . albuterol (PROVENTIL HFA;VENTOLIN HFA) 108 (90 Base) MCG/ACT inhaler Inhale 2 puffs into the lungs every 6 (six) hours as needed for wheezing or shortness of breath. 1 Inhaler 0 unknown at prn  . amLODipine (NORVASC) 10 MG tablet Take 1 tablet (10 mg total) by mouth daily. 90 tablet 2 10/14/2016 at Unknown time  . aspirin EC 81 MG tablet Take 81 mg by mouth daily.   10/14/2016 at Unknown time  . atorvastatin (LIPITOR) 20 MG tablet Take 1 tablet (20 mg total) by mouth daily. 90 tablet 1 10/14/2016 at Unknown time  . Insulin Glargine (LANTUS) 100 UNIT/ML Solostar Pen Use 60 Units daily at the same time. May titrate up if FG >200. 15 mL 3 10/14/2016 at Unknown time  . Insulin Lispro (HUMALOG KWIKPEN) 200 UNIT/ML SOPN Inject 10 Units into the skin 3 (three) times daily with meals. 15 mL 1 10/14/2016 at Unknown time  . metFORMIN (GLUCOPHAGE) 1000 MG tablet Take 1 tablet (1,000 mg total) by mouth 2 (two) times daily with a meal. 180 tablet 1 10/14/2016 at Unknown time  . saccharomyces boulardii (FLORASTOR) 250 MG capsule Take 1 capsule (250 mg total) by mouth 2 (two) times daily. You can get a probiotic over the counter. 60 capsule 0 10/14/2016 at Unknown time  . TRAVATAN Z 0.004 % SOLN ophthalmic solution Place 1 drop into both eyes at bedtime.  11 10/14/2016 at Unknown time  . valsartan-hydrochlorothiazide (DIOVAN-HCT) 320-25 MG tablet Take 1 tablet by mouth daily. 90 tablet 2 10/14/2016 at Unknown time  . Vitamin D, Ergocalciferol, (DRISDOL) 50000 units CAPS capsule TAKE ONE CAPSULE BY MOUTH TWICE A WEEK ON MONDAY AND THURSDAY (Patient taking differently: TAKE 37902 CAPSULE BY MOUTH TWICE A WEEK ON Tuesday AND THURSDAY) 8 capsule 1 10/14/2016 at Unknown time  . Insulin Pen Needle 32G X 4 MM MISC Use up to 4x a day (Patient not taking: Reported on 10/15/2016) 200 each 11 Not Taking at Unknown time  . insulin regular human CONCENTRATED (HUMULIN R U-500 KWIKPEN)  500 UNIT/ML kwikpen Inject 70-85 Units into the skin 3 (three) times daily with meals. (Patient not taking: Reported on 10/15/2016) 4 pen 0 Not Taking at Unknown time  . Zoster Vac Recomb Adjuvanted [employer]) injection Inject 0.5 mLs into the muscle every 3 (three) months. (Patient not taking: Reported on 10/15/2016) 1 each 1 Not Taking at Unknown time    Inpatient Medications:  . amoxicillin-clavulanate  1 tablet Oral TID  . aspirin EC  325 mg Oral Daily  . atorvastatin  40 mg Oral Daily  . clopidogrel  75 mg Oral Daily  . insulin aspart  0-5 Units Subcutaneous QHS  . insulin aspart  0-9 Units Subcutaneous TID WC  . insulin aspart  10 Units Subcutaneous TID WC  . insulin glargine  60 Units Subcutaneous Q2200  . latanoprost  1 drop Both Eyes QHS  . living well with diabetes book   Does not apply Once  . metFORMIN  1,000 mg Oral BID WC  . saccharomyces boulardii  250 mg Oral BID  . Vitamin D (Ergocalciferol)  50,000  Units Oral Once per day on Mon Thu    Allergies: No Known Allergies  Social History   Social History  . Marital status: Single    Spouse name: N/A  . Number of children: N/A  . Years of education: N/A   Occupational History  . Not on file.   Social History Main Topics  . Smoking status: Current Some Day Smoker  . Smokeless tobacco: Never Used     Comment: smokes occ.   Marland Kitchen Alcohol use 0.0 oz/week     Comment: occ  . Drug use: No  . Sexual activity: Not on file   Other Topics Concern  . Not on file   Social History Narrative  . No narrative on file     Family History  Problem Relation Age of Onset  . Diabetes Mother   . Hypertension Mother   . Stroke Father   . Colon cancer Neg Hx   . Esophageal cancer Neg Hx   . Rectal cancer Neg Hx   . Stomach cancer Neg Hx       Review of Systems: All other systems reviewed and are otherwise negative except as noted above.  Physical Exam: Vitals:   10/18/16 0124 10/18/16 0300 10/18/16 0856 10/18/16 0900    BP: 123/68 103/60 115/62 (!) 127/57  Pulse: 74 83  75  Resp: 16 16  18   Temp: 98.6 F (37 C) 98.4 F (36.9 C)  98.9 F (37.2 C)  TempSrc: Oral Oral  Oral  SpO2: 97% 96%  98%  Weight:      Height:        GEN- The patient is well appearing, alert and oriented x 3 today.   Head- normocephalic, atraumatic Eyes-  Sclera clear, conjunctiva pink Ears- hearing intact Oropharynx- clear Neck- supple Lungs- Clear to ausculation bilaterally, normal work of breathing Heart- RRR , no murmurs, rubs or gallops  GI- soft, NT, ND Extremities- no clubbing, cyanosis, or edema MS- no significant deformity or atrophy Skin- no rash or lesion Psych- euthymic mood, full affect   Labs:   Lab Results  Component Value Date   WBC 8.4 10/16/2016   HGB 11.8 (L) 10/16/2016   HCT 36.1 10/16/2016   MCV 92.6 10/16/2016   PLT 330 10/16/2016    Recent Labs Lab 10/16/16 0533  NA 138  K 3.4*  CL 106  CO2 24  BUN 10  CREATININE 0.52  CALCIUM 8.9  PROT 5.8*  BILITOT 0.4  ALKPHOS 73  ALT 8*  AST 14*  GLUCOSE 230*   Lab Results  Component Value Date   TROPONINI <0.03 10/16/2016   Lab Results  Component Value Date   CHOL 156 10/16/2016   CHOL 127 03/29/2016   CHOL 127 03/06/2015   Lab Results  Component Value Date   HDL 37 (L) 10/16/2016   HDL 48.80 03/29/2016   HDL 42.00 03/06/2015   Lab Results  Component Value Date   LDLCALC 98 10/16/2016   LDLCALC 61 03/29/2016   LDLCALC 61 03/06/2015   Lab Results  Component Value Date   TRIG 104 10/16/2016   TRIG 84.0 03/29/2016   TRIG 118.0 03/06/2015   Lab Results  Component Value Date   CHOLHDL 4.2 10/16/2016   CHOLHDL 3 03/29/2016   CHOLHDL 3 03/06/2015   No results found for: LDLDIRECT  No results found for: DDIMER   Radiology/Studies:  Dg Chest 2 View Result Date: 10/16/2016 CLINICAL DATA:  Stroke.  Left-sided weakness. EXAM: CHEST  2 VIEW COMPARISON:  02/06/2016 FINDINGS: The heart size and mediastinal contours are  within normal limits. Both lungs are clear. The visualized skeletal structures are unremarkable. IMPRESSION: No active cardiopulmonary disease. Electronically Signed   By: Lucienne Capers M.D.   On: 10/16/2016 00:54    Ct Head Wo Contrast Result Date: 10/15/2016 CLINICAL DATA:  Left hand numbness for 2 days.  No reported injury. EXAM: CT HEAD WITHOUT CONTRAST TECHNIQUE: Contiguous axial images were obtained from the base of the skull through the vertex without intravenous contrast. COMPARISON:  03/04/2007 brain MRI. FINDINGS: Brain: Focal white matter hypodensity in the posterior right frontal deep white matter, new since 03/04/2007. No evidence of parenchymal hemorrhage or extra-axial fluid collection. No mass lesion, mass effect, or midline shift. Cerebral volume is age appropriate. No ventriculomegaly. Vascular: No acute abnormality. Skull: No evidence of calvarial fracture. Sinuses/Orbits: The visualized paranasal sinuses are essentially clear. Other:  The mastoid air cells are unopacified. IMPRESSION: 1. Focal white matter hypodensity in the posterior right frontal deep white matter, new since 03/04/2007 brain MRI, potentially representing a subacute white matter infarct. Brain MRI correlation could be obtained as clinically warranted. 2. No mass-effect.  No acute intracranial hemorrhage. Electronically Signed   By: Ilona Sorrel M.D.   On: 10/15/2016 20:32    Mr Brain Wo Contrast Result Date: 10/16/2016 CLINICAL DATA:  Left arm and hand weakness.  Slurred screw. EXAM: MRI HEAD WITHOUT CONTRAST MRA HEAD WITHOUT CONTRAST TECHNIQUE: Multiplanar, multiecho pulse sequences of the brain and surrounding structures were obtained without intravenous contrast. Angiographic images of the head were obtained using MRA technique without contrast. COMPARISON:  Head CT 10/15/2016 Brain MRI 03/04/2007 FINDINGS: MRI HEAD FINDINGS Brain: The midline structures are normal. There are numerous scattered foci of diffusion  restriction with in the right MCA distribution. The largest area is in the right centrum semiovale and measures approximately 1.5 cm. There is no mass effect. No acute hemorrhage. There is multifocal cytotoxic edema associated with these lesions. There is multifocal hyperintense T2-weighted signal within the periventricular white matter, most often seen in the setting of chronic microvascular ischemia. Single focus of chronic microhemorrhage in the left caudate head. No hydrocephalus, age advanced atrophy or lobar predominant volume loss. No dural abnormality or extra-axial collection. Skull and upper cervical spine: The visualized skull base, calvarium, upper cervical spine and extracranial soft tissues are normal. Sinuses/Orbits: No fluid levels or advanced mucosal thickening. No mastoid effusion. Normal orbits. MRA HEAD FINDINGS Intracranial internal carotid arteries: Normal. Anterior cerebral arteries: Normal. Middle cerebral arteries: The distal right M1 segment is occluded. This is distal to an early bifurcation. There is normal flow related enhancement distally within the right MCA distribution. The left middle cerebral artery is normal. Posterior communicating arteries: Absent bilaterally Posterior cerebral arteries: Normal. Basilar artery: Normal. Vertebral arteries: Codominant. Normal. Superior cerebellar arteries: Normal. Anterior inferior cerebellar arteries: Right-dominant Posterior inferior cerebellar arteries: Left dominant IMPRESSION: 1. Distal right middle cerebral artery M1 segment occlusion. 2. Multifocal acute ischemia within the right MCA distribution, with the largest area measuring 1.5 cm. 3. No acute hemorrhage or mass effect. Findings communicated via text page to Dr. Roland Rack at 2:06 a.m., 10/16/2016. Electronically Signed   By: Ulyses Jarred M.D.   On: 10/16/2016 02:06      12-lead ECG  All prior EKG's in EPIC reviewed with no documented atrial fibrillation  Telemetry:  SR only  Assessment and Plan:  1. Cryptogenic stroke The patient presents with cryptogenic stroke.  The patient has a TEE planned for this afternoon.  I spoke at length with the patient about monitoring for afib with either a 30 day event monitor or an implantable loop recorder.  Risks, benefits, and alteratives to implantable loop recorder were discussed with the patient today.   At this time, the patient is undecided about monitoring, leaning towards 30 day monitoring, she is pending additional imaging today with some worsening of LUE weakness, and not bed yet for rehab.  RN tells me, not planned for discharge today at least.   EP service remains available, if patient decides she would like to proceed with ILR pending TEE findings and discharge timing, please recall our service.  (I have made Endo charge RN aware of worsening symp0toms and pending additional imaging in regards to TEE for discussion with TEE cardiologist today and decision on timing for this)   Baldwin Jamaica, PA-C 10/18/2016   I have seen, examined the patient, and reviewed the above assessment and plan.  Changes to above are made where necessary.  Risks and benefits of ILR discussed with the patient who wishes to proceed.  Co Sign: Thompson Grayer, MD

## 2016-10-18 NOTE — Therapy (Signed)
Occupational Therapy Treatment Patient Details Name: Sarah [name] MRN: [id number] DOB: 1945/05/14 Today's Date: 10/18/2016    History of present illness Pt is a 71 y/o female admitted secondary to L facial droop and L sided weakness (UE>LE). MRI revealed a distal R MCA occlusion. PMH including but not limited to HTN, DM, HLD.   OT comments  Focus of today's session on increased independence with functional mobility and ADL tasks. Pt requires min assist during functional mobility with 2 person hand held assist. Pt presents with decreased strength in her LUE, with increased strength proximally than distally. Pt continues to present with a significant change in functional abilities and would benefit from CIR to facilitate return to PLOF. OT will continue to follow acutely to address established goals.    Follow Up Recommendations  CIR;Supervision/Assistance - 24 hour    Equipment Recommendations  Other (comment)    Recommendations for Other Services      Precautions / Restrictions Precautions Precautions: Fall Restrictions Weight Bearing Restrictions: No       Mobility Bed Mobility Overal bed mobility: Needs Assistance Bed Mobility: Supine to Sit     Supine to sit: Supervision     General bed mobility comments: Verbal cues to use L UE to assist  Transfers Overall transfer level: Needs assistance Equipment used: None Transfers: Sit to/from Stand Sit to Stand: Min assist              Balance Overall balance assessment: Needs assistance Sitting-balance support: Feet supported Sitting balance-Leahy Scale: Good     Standing balance support: During functional activity;No upper extremity supported;Single extremity supported Standing balance-Leahy Scale: Fair Standing balance comment: Pt presents with decreased balance when pt is distracted (left lateral lean when pt is distracted). Static standing fair with min assist. dynamic standing balance poor.                             ADL either performed or assessed with clinical judgement   ADL Overall ADL's : Needs assistance/impaired     Grooming: Oral care;Minimal assistance;Standing Grooming Details (indicate cue type and reason): Pt requires min assist to manage tools needed for oral care due to decreased grip strength in L hand.              Lower Body Dressing: Mod assist;Sitting/lateral leans Lower Body Dressing Details (indicate cue type and reason): Pt able to adjust socks sitting EOB. Left lean noted during functional task, verbal cues to correct posture.              Functional mobility during ADLs: Minimal assistance General ADL Comments: Pt presents with difficulty with bimanual ADL tasks due to decreased functional use of LUE.      Vision   Additional Comments: Pt wears reading glasses   Perception     Praxis      Cognition Arousal/Alertness: Awake/alert Behavior During Therapy: WFL for tasks assessed/performed Overall Cognitive Status: Impaired/Different from baseline Area of Impairment: Attention;Safety/judgement;Problem solving                   Current Attention Level: Selective     Safety/Judgement: Decreased awareness of safety   Problem Solving: Slow processing General Comments: Pt able to draw a clock with good spatial awareness, however presented decreased spatial awareness during ADL task (standing on far left side of sink, hit head on paper towel dispencer). Pt completed letter elimination activity independently with good accuracy.  Exercises     Shoulder Instructions       General Comments Pt presents at a Brunstrom level II in her L hand and a Brunstrom level IV in her L arm . Pt is unable to complete composite flexion/extension of digits as well as thumb opposition. Pt able to extended L wrist but unable to maintain position. Pt is able to forward flex LUE [facility] however unable to maintain position. Pt shows good  awareness of LUE, however, is unable to use it functional for bimanual tasks such as applying tooth paste.     Pertinent Vitals/ Pain       Pain Assessment: No/denies pain  Home Living                                          Prior Functioning/Environment              Frequency  Min 3X/week        Progress Toward Goals  OT Goals(current goals can now be found in the care plan section)  Progress towards OT goals: Progressing toward goals  Acute Rehab OT Goals Patient Stated Goal: To get rehab and return to PLOF OT Goal Formulation: With patient Time For Goal Achievement: 10/30/16 Potential to Achieve Goals: Good ADL Goals Pt Will Perform Grooming: with modified independence;standing Pt Will Perform Upper Body Bathing: with modified independence;sitting Pt Will Perform Lower Body Bathing: with modified independence;sit to/from stand Pt Will Perform Upper Body Dressing: with modified independence;sitting Pt Will Perform Lower Body Dressing: with modified independence;sit to/from stand Pt Will Transfer to Toilet: with modified independence;ambulating Pt Will Perform Toileting - Clothing Manipulation and hygiene: with modified independence;sit to/from stand Pt Will Perform Tub/Shower Transfer: with supervision;Tub transfer;ambulating;3 in 1 Pt/caregiver will Perform Home Exercise Program: Increased strength;Left upper extremity;With theraband;With theraputty;With written HEP provided;With Supervision  Plan Discharge plan remains appropriate    Co-evaluation                 AM-PAC PT "6 Clicks" Daily Activity     Outcome Measure   Help from another person eating meals?: A Little Help from another person taking care of personal grooming?: A Little Help from another person toileting, which includes using toliet, bedpan, or urinal?: A Little Help from another person bathing (including washing, rinsing, drying)?: A Little Help from another person to  put on and taking off regular upper body clothing?: A Little Help from another person to put on and taking off regular lower body clothing?: A Little  6 Click Score: 15    End of Session Equipment Utilized During Treatment: Gait belt  OT Visit Diagnosis: Unsteadiness on feet (R26.81);Other symptoms and signs involving cognitive function;Hemiplegia and hemiparesis Hemiplegia - Right/Left: Left Hemiplegia - dominant/non-dominant: Non-Dominant   Activity Tolerance Patient tolerated treatment well   Patient Left in chair;with call bell/phone within reach;with chair alarm set   Nurse Communication Mobility status        Time: 7616-0737 OT Time Calculation (min): 26 min  Charges:    Boykin Peek, OTS [phone]    Boykin Peek 10/18/2016, 4:46 PM

## 2016-10-18 NOTE — H&P (Signed)
Physical Medicine and Rehabilitation Admission H&P    Chief Complaint  Patient presents with  . Numbness  : HPI: Sarah [name] is a 71 y.o. right handed female with history of hypertension, hyperlipidemia, diabetes mellitus and peripheral neuropathy and tobacco abuse.  Presented 10/15/2016 with left-sided weakness and facial droop. Per chart review and patient, patient lives with elderly mother. Independent prior to admission. 2 level home.Her sister from [place] to provide extended assistance as needed. Cranial CT reviewed, showing right frontal infarct.  Per report, focal white matter hypodensity in the posterior right frontal deep white matter new since 03/04/2007. MRI/MRA showed no acute hemorrhage or mass effect. Multifocal acute ischemia within the right MCA distribution with the largest area measuring 1.5 cm. Distal right middle cerebral artery M1 segment occlusion.Patient did not receive TPA. Echocardiogram with ejection fraction of 97% grade 2 diastolic dysfunction. Carotid Dopplers with 40-59% right ICA stenosis. CT angiogram of the neck showed no emboli source identified. Neurology consulted and maintained on aspirin and Plavix for CVA prophylaxis. TEE showed no PFO without masses and a loop recorder was placed. Follow up MRI of the brain pending.Placed on empiric Augmentin 10/17/2016 for suspected dental abscess. Marland Kitchen Physical and occupational therapy evaluations completed with recommendations of physical medicine rehabilitation consult.patient was admitted for a comprehensive rehabilitation program  Review of Systems  Constitutional: Negative for chills and fever.  HENT: Negative for hearing loss.   Eyes: Negative for blurred vision and double vision.  Respiratory: Negative for cough and shortness of breath.   Cardiovascular: Positive for leg swelling. Negative for chest pain and palpitations.  Gastrointestinal: Positive for constipation. Negative for nausea and  vomiting.  Genitourinary: Negative for dysuria and hematuria.  Musculoskeletal: Positive for myalgias.  Skin: Negative for rash.  Neurological: Positive for sensory change and focal weakness.  All other systems reviewed and are negative.  Past Medical History:  Diagnosis Date  . Boils   . Glaucoma   . Heart murmur   . HTN (hypertension)   . Hx of adenomatous colonic polyps   . Hyperlipidemia   . Osteoporosis   . Pneumonia   . Type II or unspecified type diabetes mellitus without mention of complication, uncontrolled    Past Surgical History:  Procedure Laterality Date  . ABDOMINAL HYSTERECTOMY    . CARPAL TUNNEL RELEASE     right  . COLONOSCOPY  12-10-10   per Dr. Deatra Ina, clear, repeat in 7 yrs   . INCISION AND DRAINAGE PERIRECTAL ABSCESS N/A 09/26/2015   Procedure: IRRIGATION AND DEBRIDEMENT PERIRECTAL ABSCESS;  Surgeon: Mickeal Skinner, MD;  Location: [place];  Service: General;  Laterality: N/A;  . KNEE ARTHROSCOPY     right  . ORIF ANKLE FRACTURE Right 03/24/2012   Procedure: OPEN REDUCTION INTERNAL FIXATION (ORIF) ANKLE FRACTURE;  Surgeon: Newt Minion, MD;  Location: [place];  Service: Orthopedics;  Laterality: Right;  Open Reduction Internal Fixation Right Bimalleolar ankle fracture  . POLYPECTOMY    . TONSILLECTOMY     Family History  Problem Relation Age of Onset  . Diabetes Mother   . Hypertension Mother   . Stroke Father   . Colon cancer Neg Hx   . Esophageal cancer Neg Hx   . Rectal cancer Neg Hx   . Stomach cancer Neg Hx    Social History:  reports that she has been smoking.  She has never used smokeless tobacco. She reports that she drinks alcohol. She reports that she does not  use drugs. Allergies: No Known Allergies Medications Prior to Admission  Medication Sig Dispense Refill  . albuterol (PROVENTIL HFA;VENTOLIN HFA) 108 (90 Base) MCG/ACT inhaler Inhale 2 puffs into the lungs every 6 (six) hours as needed for wheezing or shortness of breath. 1  Inhaler 0  . amLODipine (NORVASC) 10 MG tablet Take 1 tablet (10 mg total) by mouth daily. 90 tablet 2  . aspirin EC 81 MG tablet Take 81 mg by mouth daily.    Marland Kitchen atorvastatin (LIPITOR) 20 MG tablet Take 1 tablet (20 mg total) by mouth daily. 90 tablet 1  . Insulin Glargine (LANTUS) 100 UNIT/ML Solostar Pen Use 60 Units daily at the same time. May titrate up if FG >200. 15 mL 3  . Insulin Lispro (HUMALOG KWIKPEN) 200 UNIT/ML SOPN Inject 10 Units into the skin 3 (three) times daily with meals. 15 mL 1  . metFORMIN (GLUCOPHAGE) 1000 MG tablet Take 1 tablet (1,000 mg total) by mouth 2 (two) times daily with a meal. 180 tablet 1  . saccharomyces boulardii (FLORASTOR) 250 MG capsule Take 1 capsule (250 mg total) by mouth 2 (two) times daily. You can get a probiotic over the counter. 60 capsule 0  . TRAVATAN Z 0.004 % SOLN ophthalmic solution Place 1 drop into both eyes at bedtime.  11  . valsartan-hydrochlorothiazide (DIOVAN-HCT) 320-25 MG tablet Take 1 tablet by mouth daily. 90 tablet 2  . Vitamin D, Ergocalciferol, (DRISDOL) 50000 units CAPS capsule TAKE ONE CAPSULE BY MOUTH TWICE A WEEK ON MONDAY AND THURSDAY (Patient taking differently: TAKE 37169 CAPSULE BY MOUTH TWICE A WEEK ON Tuesday AND THURSDAY) 8 capsule 1  . Insulin Pen Needle 32G X 4 MM MISC Use up to 4x a day (Patient not taking: Reported on 10/15/2016) 200 each 11  . insulin regular human CONCENTRATED (HUMULIN R U-500 KWIKPEN) 500 UNIT/ML kwikpen Inject 70-85 Units into the skin 3 (three) times daily with meals. (Patient not taking: Reported on 10/15/2016) 4 pen 0  . Zoster Vac Recomb Adjuvanted [facility]) injection Inject 0.5 mLs into the muscle every 3 (three) months. (Patient not taking: Reported on 10/15/2016) 1 each 1    Drug Regimen Review Drug regimen was reviewed remains appropriately with no significant issues identified  Home: Home Living Family/patient expects to be discharged to:: Private residence Living Arrangements:  Parent Available Help at Discharge: Family, Available 24 hours/day Type of Home: House Home Access: Stairs to enter Technical brewer of Steps: 1 Home Layout: Two level Alternate Level Stairs-Number of Steps: flight Alternate Level Stairs-Rails: Right, Left, Can reach both Bathroom Shower/Tub: Tub/shower unit Home Equipment: Environmental consultant - 2 wheels, Cane - single point, Other (comment)   Functional History: Prior Function Level of Independence: Independent  Functional Status:  Mobility: Bed Mobility Overal bed mobility: Needs Assistance Bed Mobility: Supine to Sit, Sit to Supine Supine to sit: Supervision Sit to supine: Supervision General bed mobility comments: cues for to use L UE to assist with transitional movements Transfers Overall transfer level: Needs assistance Equipment used: None, Straight cane Transfers: Sit to/from Stand Sit to Stand: Min guard General transfer comment: cues to use L UE to assist in pushing up into standing; SPC upon standing for balance Ambulation/Gait Ambulation/Gait assistance: Min assist, Mod assist Ambulation Distance (Feet): 100 Feet Assistive device: 1 person hand held assist, Straight cane (rail in hallway) Gait Pattern/deviations: Step-to pattern, Step-through pattern, Decreased step length - left, Decreased stride length, Narrow base of support, Decreased dorsiflexion - left General Gait Details: several  losses of balance requiring assist to recover; gait training begin with SPC and min A with max cues for 3 point gait pattern; pt demonstrated difficutly with motor planning and at times leaving L LE behind if not given cues to follow through with step; rail in hallway used as well for balance and pt given multimodal cues for improved L knee flexion during swing phase, advancement of L LE, and L heel strike; pt tends to demonstrate circumduction of L LE when using SPC  Gait velocity: decreased Gait velocity interpretation: Below normal speed  for age/gender    ADL: ADL Overall ADL's : Needs assistance/impaired Eating/Feeding: Set up, Sitting Grooming: Minimal assistance, Standing Grooming Details (indicate cue type and reason): assist for activities involving LUE Upper Body Bathing: Minimal assistance, Sitting Lower Body Bathing: Sit to/from stand, Moderate assistance Upper Body Dressing : Minimal assistance, Sitting Lower Body Dressing: Sit to/from stand, Moderate assistance Toilet Transfer: Minimal assistance, Ambulation Toilet Transfer Details (indicate cue type and reason): HHA at times Toileting- Clothing Manipulation and Hygiene: Minimal assistance, Sit to/from stand Tub/ Shower Transfer: Moderate assistance, Tub transfer, Ambulation, 3 in 1 Functional mobility during ADLs: Minimal assistance (+1 HHA) General ADL Comments: Pt completed bed mobility, toilet transfer, grooming task at sink.  Cognition: Cognition Overall Cognitive Status: Impaired/Different from baseline Arousal/Alertness: Lethargic Orientation Level: Oriented X4 Attention: Selective Sustained Attention: Impaired Sustained Attention Impairment: Verbal complex Selective Attention: Impaired Selective Attention Impairment: Verbal basic, Verbal complex Memory: Appears intact Awareness: Impaired Awareness Impairment: Intellectual impairment Problem Solving: Impaired Problem Solving Impairment: Verbal complex Executive Function: Reasoning Reasoning: Impaired Reasoning Impairment: Verbal complex Safety/Judgment: Impaired Cognition Arousal/Alertness: Awake/alert Behavior During Therapy: WFL for tasks assessed/performed Overall Cognitive Status: Impaired/Different from baseline Area of Impairment: Attention, Safety/judgement, Problem solving Current Attention Level: Selective Safety/Judgement: Decreased awareness of safety, Decreased awareness of deficits Problem Solving: Slow processing, Difficulty sequencing, Requires verbal cues  Physical  Exam: Blood pressure (!) 127/57, pulse 75, temperature 98.9 F (37.2 C), temperature source Oral, resp. rate 18, height 5' (1.524 m), weight 63.1 kg (139 lb 3.2 oz), SpO2 98 %. Physical Exam  Vitals reviewed. Constitutional: She is oriented to person, place, and time. She appears well-developed.  HENT:  Head: Normocephalic.  Eyes: EOM are normal.  Neck: Normal range of motion. Neck supple. No thyromegaly present.  Cardiovascular: Normal rate, regular rhythm and normal heart sounds.  Exam reveals no friction rub.   No murmur heard. Respiratory: Effort normal and breath sounds normal. No respiratory distress.  GI: Soft. Bowel sounds are normal. She exhibits no distension.  Neurological: She is alert and oriented to person, place, and time.  Follows commands. CN exam left central 7, mild dysarthria Fair awareness of deficits. Mtoor: RUE 5/5 prox to distal,  RLE: 5/5 proximal to distal LUE: deltoid, biceps, triceps 4-/5, wrist and HI 2/5. LLE: 2+ to 3-/5 HF, KE and 4/5 ADF/PF Sensation diminished to light touch distal LUE DTRs symmetric 1+, Toes down.  Psych: affect is pleasant, pt cooperative  Results for orders placed or performed during the hospital encounter of 10/15/16 (from the past 48 hour(s))  Glucose, capillary     Status: Abnormal   Collection Time: 10/16/16  4:02 PM  Result Value Ref Range   Glucose-Capillary 193 (H) 65 - 99 mg/dL   Comment 1 Notify RN   Glucose, capillary     Status: None   Collection Time: 10/16/16  8:56 PM  Result Value Ref Range   Glucose-Capillary 81 65 - 99 mg/dL  Comment 1 Notify RN    Comment 2 Document in Chart   Glucose, capillary     Status: None   Collection Time: 10/17/16  6:35 AM  Result Value Ref Range   Glucose-Capillary 91 65 - 99 mg/dL  Glucose, capillary     Status: Abnormal   Collection Time: 10/17/16 11:34 AM  Result Value Ref Range   Glucose-Capillary 125 (H) 65 - 99 mg/dL   Comment 1 Notify RN    Comment 2 Document in Chart    Glucose, capillary     Status: Abnormal   Collection Time: 10/17/16  4:49 PM  Result Value Ref Range   Glucose-Capillary 63 (L) 65 - 99 mg/dL   Comment 1 Notify RN    Comment 2 Document in Chart   Glucose, capillary     Status: Abnormal   Collection Time: 10/17/16  9:36 PM  Result Value Ref Range   Glucose-Capillary 209 (H) 65 - 99 mg/dL   Comment 1 Notify RN    Comment 2 Document in Chart   Glucose, capillary     Status: Abnormal   Collection Time: 10/18/16  6:28 AM  Result Value Ref Range   Glucose-Capillary 131 (H) 65 - 99 mg/dL   Comment 1 Notify RN    Comment 2 Document in Chart    No results found.     Medical Problem List and Plan: 1.  Left-sided weakness secondary to right MCA infarction. Status post loop recorder  -admit to inpatient rehab 2.  DVT Prophylaxis/Anticoagulation: SCD.Monitor for any signs of DVT 3. Pain Management: Hydrocodone as needed 4. Mood: Provide emotional support 5. Neuropsych: This patient is capable of making decisions on her own behalf. 6. Skin/Wound Care: Routine skin checks 7. Fluids/Electrolytes/Nutrition: Routine I&O with follow up chemestries 8.Diabetes mellitus peripheral neuropathy.Hemoglobin A1c 12.9.Glucophage 1000 mg twice a day,NovoLog 10 units 3 times a day with meals, Lantus insulin 60 units daily. Check blood sugars before meals and at bedtime. Diabetic teaching 9.Hypertension.No current antihypertensive medication. Patient on Norvasc 10 mg daily, Diovan-HCT 320-25 milligrams daily prior to admission. Resume as needed 10. Hyperlipidemia. Lipitor 11. COPD/ Tobacco abuse. Counseling 12.Suspect dental abscess. Empiric Augmentin 500 mg 3 times a day initiated 10/17/2016   Post Admission Physician Evaluation: 1. Functional deficits secondary  to right MCA infarct. 2. Patient is admitted to receive collaborative, interdisciplinary care between the physiatrist, rehab nursing staff, and therapy team. 3. Patient's level of medical  complexity and substantial therapy needs in context of that medical necessity cannot be provided at a lesser intensity of care such as a SNF. 4. Patient has experienced substantial functional loss from his/her baseline which was documented above under the "Functional History" and "Functional Status" headings.  Judging by the patient's diagnosis, physical exam, and functional history, the patient has potential for functional progress which will result in measurable gains while on inpatient rehab.  These gains will be of substantial and practical use upon discharge  in facilitating mobility and self-care at the household level. 5. Physiatrist will provide 24 hour management of medical needs as well as oversight of the therapy plan/treatment and provide guidance as appropriate regarding the interaction of the two. 6. The Preadmission Screening has been reviewed and patient status is unchanged unless otherwise stated above. 7. 24 hour rehab nursing will assist with bladder management, bowel management, safety, skin/wound care, disease management, medication administration, pain management and patient education  and help integrate therapy concepts, techniques,education, etc. 8. PT will assess and treat  for/with: Lower extremity strength, range of motion, stamina, balance, functional mobility, safety, adaptive techniques and equipment, NMR, visual-spatial awareness, family ed.   Goals are: supervision to mod I. 9. OT will assess and treat for/with: ADL's, functional mobility, safety, upper extremity strength, adaptive techniques and equipment, NMR, family ed, visual-spatial awareness.   Goals are: supervision to mod I. Therapy may proceed with showering this patient. 10. SLP will assess and treat for/with: speech, communication, cognition.  Goals are: mod I. 11. Case Management and Social Worker will assess and treat for psychological issues and discharge planning. 12. Team conference will be held weekly to  assess progress toward goals and to determine barriers to discharge. 13. Patient will receive at least 3 hours of therapy per day at least 5 days per week. 14. ELOS: 7-10 days       15. Prognosis:  excellent     Meredith Staggers, MD, [facility] 10/19/2016  Cathlyn Parsons., PA-C 10/18/2016

## 2016-10-18 NOTE — Progress Notes (Signed)
PT Cancellation Note  Patient Details Name: [name] MRN: [id number] DOB: [birth year]   Cancelled Treatment:    Reason Eval/Treat Not Completed: Patient at procedure or test/unavailable (OTF at this time)   Duncan Dull 10/18/2016, 1:29 PM

## 2016-10-18 NOTE — CV Procedure (Signed)
TEE  LA, LA appendage without masses No PFO by color doppler or with injection of agitated saline  TV normal  AV mildly thickened. MV normal   LVEF normal Mild fixed atherosclerotic plaque in thoracic aorta

## 2016-10-18 NOTE — Progress Notes (Signed)
OT Note - Addendum    10/18/16 1800  OT Visit Information  Last OT Received On 10/18/16  OT Time Calculation  OT Start Time (ACUTE ONLY) 1437  OT Stop Time (ACUTE ONLY) 1503  OT Time Calculation (min) 26 min  OT General Charges  $OT Visit 1 Visit  OT Treatments  $Self Care/Home Management  23-37 mins  [facility], OT/L  [phone] 10/18/2016

## 2016-10-18 NOTE — H&P (View-Only) (Signed)
PROGRESS NOTE    Sarah [name]  OZH:[id number] DOB: Oct 09, 1945 DOA: 10/15/2016 PCP: [name], [name], MD   Brief Narrative: Sarah [name] is a 71 y.o. female, w hypertension, hyperlipidemia, dm2, w c/o left arm/hand weakness since yesterday am. Slight numbness, tingling.  Pt denies headache, vision change, cp, palp, sob, n/v.  Pt may have slight facial droop as well as slight dysarthria per family. Patient found to have an acute CVA on MRI   Assessment & Plan:   Principal Problem:   Stroke (cerebrum) ([facility]) Active Problems:   Uncontrolled type 2 diabetes mellitus with insulin therapy ([facility])   Hypertension   Left arm weakness   Diastolic dysfunction   Benign essential HTN   Tobacco abuse   Acute blood loss anemia   CVA MRI significant for right MCA segment occlusion with evidence of multifocal acute ischemia within MCA distribution. Echocardiogram significant for grade 2 diastolic heart dysfunction and an EF of 55-60% and PA peak pressure of 76mm. LDL of 98. A1C of 12.9. Right ICA with 40-59% and left ICA with 1-39% stenosisWorsening symptoms. -PT/OT: CIR -neuro: asa/plavix; TEE loop recorder (message sent to cardiology to schedule), CTA neck -permissive hypertension -CTA neck -repeat MRI head  Diabetes mellitus, type 2 -continue SSI -continue Lantus -meal coverage for >50% meal eaten -continue Metformin  Essential hypertension -discontinue antihypertensives in setting of acute stroke  Glaucoma Continue Travatan  Diastolic heart dysfunction EF of 55-60% with grade 2 diastolic dysfunction  Dental abscess -augmentin -norco prn   DVT prophylaxis: SCDs Code Status: Full code Family Communication: None at bedside Disposition Plan: Discharge pending continued medical workup to SNF vs CIR   Consultants:   Neurology  Procedures:   Echocardiogram (10/6)  Antimicrobials:  None    Subjective: Dental pain improved  Objective: Vitals:   10/18/16 0124 10/18/16 0300 10/18/16 0856 10/18/16 0900  BP: 123/68 103/60 115/62 (!) 127/57  Pulse: 74 83  75  Resp: 16 16  18   Temp: 98.6 F (37 C) 98.4 F (36.9 C)  98.9 F (37.2 C)  TempSrc: Oral Oral  Oral  SpO2: 97% 96%  98%  Weight:      Height:       No intake or output data in the 24 hours ending 10/18/16 1015 Filed Weights   10/15/16 1618 10/15/16 2251  Weight: 63.5 kg (140 lb) 63.1 kg (139 lb 3.2 oz)    Examination:  General exam: Appears calm and comfortable HEENT: swelling of left lower gums with tenderness and no erythema Respiratory system: Clear to auscultation. Respiratory effort normal. Cardiovascular system: S1 & S2 heard, RRR. No murmurs. Gastrointestinal system: Abdomen is nondistended, soft and nontender. Normal bowel sounds heard. Central nervous system: Alert and oriented. 3/5 LUE strength with 0/5 grip strength compared to 5/5 for RUE and bilateral L extremities. Extremities: No edema. No calf tenderness Skin: No cyanosis. No rashes Psychiatry: Judgement and insight appear normal. Mood & affect appropriate.     Data Reviewed: I have personally reviewed following labs and imaging studies  CBC:  Recent Labs Lab 10/15/16 2033 10/15/16 2042 10/16/16 0533  WBC 10.1  --  8.4  NEUTROABS 6.4  --   --   HGB 13.2 14.3 11.8*  HCT 39.8 42.0 36.1  MCV 92.8  --  92.6  PLT 363  --  629   Basic Metabolic Panel:  Recent Labs Lab 10/15/16 2033 10/15/16 2042 10/16/16 0030 10/16/16 0533  NA 138 139  --  138  K  4.0 3.8  --  3.4*  CL 104 103  --  106  CO2 24  --   --  24  GLUCOSE 335* 333*  --  230*  BUN 13 16  --  10  CREATININE 0.69 0.50  --  0.52  CALCIUM 9.7  --   --  8.9  MG  --   --  1.6*  --    GFR: Estimated Creatinine Clearance: 55 mL/min (by C-G formula based on SCr of 0.52 mg/dL). Liver Function Tests:  Recent Labs Lab 10/15/16 2033 10/16/16 0533  AST 14* 14*  ALT 11* 8*  ALKPHOS 93 73  BILITOT 0.4 0.4  PROT 7.0 5.8*    ALBUMIN 3.5 2.8*   No results for input(s): LIPASE, AMYLASE in the last 168 hours. No results for input(s): AMMONIA in the last 168 hours. Coagulation Profile:  Recent Labs Lab 10/15/16 2033  INR 0.95   Cardiac Enzymes:  Recent Labs Lab 10/16/16 0030 10/16/16 0533  TROPONINI <0.03 <0.03   BNP (last 3 results) No results for input(s): PROBNP in the last 8760 hours. HbA1C:  Recent Labs  10/16/16 0533  HGBA1C 12.9*   CBG:  Recent Labs Lab 10/17/16 0635 10/17/16 1134 10/17/16 1649 10/17/16 2136 10/18/16 0628  GLUCAP 91 125* 63* 209* 131*   Lipid Profile:  Recent Labs  10/16/16 0533  CHOL 156  HDL 37*  LDLCALC 98  TRIG 104  CHOLHDL 4.2   Thyroid Function Tests: No results for input(s): TSH, T4TOTAL, FREET4, T3FREE, THYROIDAB in the last 72 hours. Anemia Panel: No results for input(s): VITAMINB12, FOLATE, FERRITIN, TIBC, IRON, RETICCTPCT in the last 72 hours. Sepsis Labs: No results for input(s): PROCALCITON, LATICACIDVEN in the last 168 hours.  No results found for this or any previous visit (from the past 240 hour(s)).       Radiology Studies: No results found.      Scheduled Meds: . amoxicillin-clavulanate  1 tablet Oral TID  . aspirin EC  325 mg Oral Daily  . atorvastatin  40 mg Oral Daily  . clopidogrel  75 mg Oral Daily  . insulin aspart  0-5 Units Subcutaneous QHS  . insulin aspart  0-9 Units Subcutaneous TID WC  . insulin aspart  10 Units Subcutaneous TID WC  . insulin glargine  60 Units Subcutaneous Q2200  . latanoprost  1 drop Both Eyes QHS  . living well with diabetes book   Does not apply Once  . metFORMIN  1,000 mg Oral BID WC  . saccharomyces boulardii  250 mg Oral BID  . Vitamin D (Ergocalciferol)  50,000 Units Oral Once per day on Mon Thu   Continuous Infusions:    LOS: 3 days     Cordelia Poche, MD [facility] 10/18/2016, 10:15 AM Pager: [phone]  If 7PM-7AM, please contact  night-coverage [email or web address] Password [email or web address] 10/18/2016, 10:15 AM

## 2016-10-19 ENCOUNTER — Encounter (HOSPITAL_COMMUNITY)
Admission: EM | Disposition: A | Discharge status: Rehabilitation Facility | Payer: Self-pay | Source: Home / Self Care | Attending: Family Medicine

## 2016-10-19 ENCOUNTER — Encounter (HOSPITAL_COMMUNITY): Payer: Self-pay | Admitting: Internal Medicine

## 2016-10-19 ENCOUNTER — Inpatient Hospital Stay (HOSPITAL_COMMUNITY): Payer: Medicare Other

## 2016-10-19 ENCOUNTER — Inpatient Hospital Stay (HOSPITAL_COMMUNITY)
Admission: RE | Admit: 2016-10-19 | Discharge: 2016-10-28 | DRG: 057 | Disposition: A | Discharge status: Home / Self Care | Payer: Medicare Other | Source: Intra-hospital | Attending: Physical Medicine & Rehabilitation | Admitting: Physical Medicine & Rehabilitation

## 2016-10-19 DIAGNOSIS — Z794 Long term (current) use of insulin: Secondary | ICD-10-CM | POA: Diagnosis not present

## 2016-10-19 DIAGNOSIS — G8194 Hemiplegia, unspecified affecting left nondominant side: Secondary | ICD-10-CM

## 2016-10-19 DIAGNOSIS — I10 Essential (primary) hypertension: Secondary | ICD-10-CM | POA: Diagnosis present

## 2016-10-19 DIAGNOSIS — F17200 Nicotine dependence, unspecified, uncomplicated: Secondary | ICD-10-CM | POA: Diagnosis present

## 2016-10-19 DIAGNOSIS — I639 Cerebral infarction, unspecified: Secondary | ICD-10-CM

## 2016-10-19 DIAGNOSIS — Z833 Family history of diabetes mellitus: Secondary | ICD-10-CM

## 2016-10-19 DIAGNOSIS — E1142 Type 2 diabetes mellitus with diabetic polyneuropathy: Secondary | ICD-10-CM | POA: Diagnosis present

## 2016-10-19 DIAGNOSIS — E1165 Type 2 diabetes mellitus with hyperglycemia: Secondary | ICD-10-CM | POA: Diagnosis present

## 2016-10-19 DIAGNOSIS — Z79899 Other long term (current) drug therapy: Secondary | ICD-10-CM | POA: Diagnosis not present

## 2016-10-19 DIAGNOSIS — E785 Hyperlipidemia, unspecified: Secondary | ICD-10-CM | POA: Diagnosis not present

## 2016-10-19 DIAGNOSIS — J449 Chronic obstructive pulmonary disease, unspecified: Secondary | ICD-10-CM | POA: Diagnosis present

## 2016-10-19 DIAGNOSIS — Z8249 Family history of ischemic heart disease and other diseases of the circulatory system: Secondary | ICD-10-CM | POA: Diagnosis not present

## 2016-10-19 DIAGNOSIS — Z7982 Long term (current) use of aspirin: Secondary | ICD-10-CM

## 2016-10-19 DIAGNOSIS — I69354 Hemiplegia and hemiparesis following cerebral infarction affecting left non-dominant side: Principal | ICD-10-CM

## 2016-10-19 DIAGNOSIS — Z23 Encounter for immunization: Secondary | ICD-10-CM | POA: Diagnosis not present

## 2016-10-19 DIAGNOSIS — S82309A Unspecified fracture of lower end of unspecified tibia, initial encounter for closed fracture: Secondary | ICD-10-CM | POA: Diagnosis not present

## 2016-10-19 DIAGNOSIS — I63511 Cerebral infarction due to unspecified occlusion or stenosis of right middle cerebral artery: Secondary | ICD-10-CM | POA: Diagnosis not present

## 2016-10-19 DIAGNOSIS — Z90710 Acquired absence of both cervix and uterus: Secondary | ICD-10-CM | POA: Diagnosis not present

## 2016-10-19 HISTORY — PX: LOOP RECORDER INSERTION: EP1214

## 2016-10-19 LAB — GLUCOSE, CAPILLARY
Glucose-Capillary: 67 mg/dL (ref 65–99)
Glucose-Capillary: 73 mg/dL (ref 65–99)
Glucose-Capillary: 153 mg/dL — ABNORMAL HIGH (ref 65–99)
Glucose-Capillary: 61 mg/dL — ABNORMAL LOW (ref 65–99)
Glucose-Capillary: 97 mg/dL (ref 65–99)

## 2016-10-19 SURGERY — LOOP RECORDER INSERTION
Anesthesia: LOCAL

## 2016-10-19 MED ORDER — CLOPIDOGREL BISULFATE 75 MG PO TABS
75.0000 mg | ORAL_TABLET | Freq: Every day | ORAL | Status: DC
  Administered 2016-10-20 – 2016-10-28 (×9): 75 mg via ORAL
  Filled 2016-10-19 (×9): qty 1

## 2016-10-19 MED ORDER — ATORVASTATIN CALCIUM 40 MG PO TABS
40.0000 mg | ORAL_TABLET | Freq: Every day | ORAL | Status: DC
  Administered 2016-10-20 – 2016-10-28 (×9): 40 mg via ORAL
  Filled 2016-10-19 (×9): qty 1

## 2016-10-19 MED ORDER — ACETAMINOPHEN 325 MG PO TABS
650.0000 mg | ORAL_TABLET | ORAL | Status: DC | PRN
  Administered 2016-10-22 – 2016-10-27 (×2): 650 mg via ORAL
  Filled 2016-10-19 (×2): qty 2

## 2016-10-19 MED ORDER — ONDANSETRON HCL 4 MG/2ML IJ SOLN: 4.0000 mg | Freq: Four times a day (QID) | INTRAMUSCULAR | Status: DC | PRN

## 2016-10-19 MED ORDER — LIDOCAINE-EPINEPHRINE 1 %-1:100000 IJ SOLN
INTRAMUSCULAR | Status: DC | PRN
  Administered 2016-10-19: 30 mL

## 2016-10-19 MED ORDER — INSULIN ASPART 100 UNIT/ML ~~LOC~~ SOLN
0.0000 [IU] | Freq: Every day | SUBCUTANEOUS | Status: DC
  Administered 2016-10-25: 3 [IU] via SUBCUTANEOUS
  Administered 2016-10-26 – 2016-10-27 (×2): 4 [IU] via SUBCUTANEOUS

## 2016-10-19 MED ORDER — VITAMIN D (ERGOCALCIFEROL) 1.25 MG (50000 UNIT) PO CAPS
50000.0000 [IU] | ORAL_CAPSULE | ORAL | Status: DC
  Administered 2016-10-21 – 2016-10-28 (×3): 50000 [IU] via ORAL
  Filled 2016-10-19 (×3): qty 1

## 2016-10-19 MED ORDER — LIDOCAINE-EPINEPHRINE 1 %-1:100000 IJ SOLN
INTRAMUSCULAR | Status: AC
  Filled 2016-10-19: qty 1

## 2016-10-19 MED ORDER — ACETAMINOPHEN 160 MG/5ML PO SOLN: 650.0000 mg | ORAL | Status: DC | PRN

## 2016-10-19 MED ORDER — METFORMIN HCL 500 MG PO TABS
1000.0000 mg | ORAL_TABLET | Freq: Two times a day (BID) | ORAL | Status: DC
  Administered 2016-10-20 – 2016-10-24 (×9): 1000 mg via ORAL
  Filled 2016-10-19 (×14): qty 2

## 2016-10-19 MED ORDER — ALBUTEROL SULFATE (2.5 MG/3ML) 0.083% IN NEBU: 3.0000 mL | INHALATION_SOLUTION | Freq: Four times a day (QID) | RESPIRATORY_TRACT | Status: DC | PRN

## 2016-10-19 MED ORDER — ACETAMINOPHEN 650 MG RE SUPP: 650.0000 mg | RECTAL | Status: DC | PRN

## 2016-10-19 MED ORDER — ONDANSETRON HCL 4 MG PO TABS
4.0000 mg | ORAL_TABLET | Freq: Four times a day (QID) | ORAL | Status: DC | PRN
  Administered 2016-10-20: 4 mg via ORAL
  Filled 2016-10-19: qty 1

## 2016-10-19 MED ORDER — AMOXICILLIN-POT CLAVULANATE 500-125 MG PO TABS
1.0000 | ORAL_TABLET | Freq: Three times a day (TID) | ORAL | Status: DC
Start: 2016-10-19 — End: 2016-10-28

## 2016-10-19 MED ORDER — SACCHAROMYCES BOULARDII 250 MG PO CAPS
250.0000 mg | ORAL_CAPSULE | Freq: Two times a day (BID) | ORAL | Status: DC
  Administered 2016-10-19 – 2016-10-27 (×16): 250 mg via ORAL
  Filled 2016-10-19 (×16): qty 1

## 2016-10-19 MED ORDER — ASPIRIN EC 325 MG PO TBEC
325.0000 mg | DELAYED_RELEASE_TABLET | Freq: Every day | ORAL | Status: DC
  Administered 2016-10-20 – 2016-10-28 (×9): 325 mg via ORAL
  Filled 2016-10-19 (×9): qty 1

## 2016-10-19 MED ORDER — INFLUENZA VAC SPLIT HIGH-DOSE 0.5 ML IM SUSY
0.5000 mL | PREFILLED_SYRINGE | INTRAMUSCULAR | Status: AC
  Administered 2016-10-27: 0.5 mL via INTRAMUSCULAR
  Filled 2016-10-19 (×2): qty 0.5

## 2016-10-19 MED ORDER — INSULIN GLARGINE 100 UNIT/ML ~~LOC~~ SOLN
55.0000 [IU] | Freq: Every day | SUBCUTANEOUS | Status: DC
  Filled 2016-10-19: qty 0.55

## 2016-10-19 MED ORDER — CLOPIDOGREL BISULFATE 75 MG PO TABS: 75.0000 mg | ORAL_TABLET | Freq: Every day | ORAL | Status: DC

## 2016-10-19 MED ORDER — INSULIN GLARGINE 100 UNIT/ML ~~LOC~~ SOLN: 55.0000 [IU] | Freq: Every day | SUBCUTANEOUS | Status: DC

## 2016-10-19 MED ORDER — HYDROCODONE-ACETAMINOPHEN 5-325 MG PO TABS
1.0000 | ORAL_TABLET | Freq: Four times a day (QID) | ORAL | Status: DC | PRN
  Administered 2016-10-19 – 2016-10-20 (×2): 2 via ORAL
  Filled 2016-10-19 (×2): qty 2

## 2016-10-19 MED ORDER — SORBITOL 70 % SOLN
30.0000 mL | Freq: Every day | Status: DC | PRN
  Administered 2016-10-26: 30 mL via ORAL
  Filled 2016-10-19: qty 30

## 2016-10-19 MED ORDER — LATANOPROST 0.005 % OP SOLN
1.0000 [drp] | Freq: Every day | OPHTHALMIC | Status: DC
  Administered 2016-10-19 – 2016-10-27 (×9): 1 [drp] via OPHTHALMIC
  Filled 2016-10-19: qty 2.5

## 2016-10-19 MED ORDER — AMOXICILLIN-POT CLAVULANATE 500-125 MG PO TABS
1.0000 | ORAL_TABLET | Freq: Three times a day (TID) | ORAL | Status: DC
  Administered 2016-10-19 – 2016-10-25 (×19): 500 mg via ORAL
  Filled 2016-10-19 (×20): qty 1

## 2016-10-19 MED ORDER — HYDROCODONE-ACETAMINOPHEN 5-325 MG PO TABS
1.0000 | ORAL_TABLET | Freq: Four times a day (QID) | ORAL | Status: DC | PRN
Start: 2016-10-19 — End: 2016-10-27

## 2016-10-19 MED ORDER — METFORMIN HCL 1000 MG PO TABS: 1000.0000 mg | ORAL_TABLET | Freq: Two times a day (BID) | ORAL | Status: DC

## 2016-10-19 SURGICAL SUPPLY — 2 items
LOOP REVEAL LINQSYS (Prosthesis & Implant Heart) ×1 IMPLANT
PACK LOOP INSERTION (CUSTOM PROCEDURE TRAY) ×2 IMPLANT

## 2016-10-19 NOTE — Discharge Instructions (Addendum)
Sarah [name],  You were found to have a stroke. This affected mainly your left arm and your face. You will be discharged to inpatient rehabilitation to continue your journey of getting well. You had a loop recorder placed to check for abnormal heart rhythm. Please follow-up with the neurologist in 6 weeks. You will be on aspirin and Plavix for 3 months, then use Plavix alone indefinitely.   Loop recorder implant site care Keep incision clean and dry for 3 days. You can remove outer dressing tomorrow. Leave steri-strips (little pieces of tape) on until seen in the office for wound check appointment. Call the office [phone]) for redness, drainage, swelling, or fever.

## 2016-10-19 NOTE — Discharge Summary (Signed)
Physician Discharge Summary  CARROLL RANNEY PNT:[id number] DOB: 06-24-1945 DOA: 10/15/2016  PCP: [name], [name], MD  Admit date: 10/15/2016 Discharge date: 10/19/2016  Admitted From: Home Disposition: CIR  Recommendations for Outpatient Follow-up:  1. Follow up with PCP in 1 week 2. Follow up with neurology in 6 weeks 3. Aspirin and Plavix for 3 months, then Plavix alone 4. Dental follow-up for abscess/tooth extraction 5. Please follow up on the following pending results: None  Home Health: CIR Equipment/Devices: CIR  Discharge Condition: Stable CODE STATUS: Full code Diet recommendation: Heart healthy/carb modified   Brief/Interim Summary:  Admission HPI written by Jani Gravel, MD    HPI:   Sarah [name]  is a 71 y.o. female, w hypertension, hyperlipidemia, dm2, w c/o left arm/hand weakness since yesterday am. Slight numbness, tingling.  Pt denies headache, vision change, cp, palp, sob, n/v.  Pt may have slight facial droop as well as slight dysarthria per family. pt was brought to ED for r/o CVA.   In ED, CT brain => focal white matter hypodensity in the posterior right frontal deep white matter new since 03/04/2007, potentially representing a subacute white matter infarct.    Wbc 10.1, Hgb 13.2, Plt 363, Glucose 335.  Pt will be admitted for r/o CVA.    Hospital course:  CVA MRI significant for right MCA segment occlusion with evidence of multifocal acute ischemia within MCA distribution. Echocardiogram significant for grade 2 diastolic heart dysfunction and an EF of 55-60% and PA peak pressure of 39mm. LDL of 98. A1C of 12.9. Right ICA with 40-59% and left ICA with 1-39% stenosis. Worsening symptoms on 10/8 with repeat MRI showing slight extension. TEE performed on 10/8 and significant for no PFO or thrombus. Loop recorder placed on 10/19/16. Aspirin and Plavix for 3 months, then Plavix alone. Inpatient rehabilitation on discharge.  Diabetes mellitus, type  2 Sliding scale insulin and Lantus while inpatient. Decreased Lantus to 55 units secondary to low blood sugar in AM. Resume metformin as an outapient on 10/10 secondary to IV contrast.  Essential hypertension Discontinued antihypertensives in setting of acute stroke. Resume in 7 days after discharge.  Glaucoma Continue Travatan  Diastolic heart dysfunction EF of 55-60% with grade 2 diastolic dysfunction  Dental abscess Started on Augmentin and Norco. Patient to follow-up with dentist as an outpatient for tooth extraction. End date for Augmentin is: 10/23/2016  Discharge Diagnoses:  Principal Problem:   Stroke (cerebrum) [facility]) Active Problems:   Uncontrolled type 2 diabetes mellitus with insulin therapy ([place])   Hypertension   Left arm weakness   Diastolic dysfunction   Benign essential HTN   Tobacco abuse   Acute blood loss anemia   Cerebral infarction due to embolism of right middle cerebral artery [facility])    Discharge Instructions   Allergies as of 10/19/2016   No Known Allergies     Medication List    STOP taking these medications   amLODipine 10 MG tablet Commonly known as:  NORVASC   Insulin Glargine 100 UNIT/ML Solostar Pen Commonly known as:  LANTUS Replaced by:  insulin glargine 100 UNIT/ML injection   Insulin Lispro 200 UNIT/ML Sopn Commonly known as:  HUMALOG KWIKPEN   insulin regular human CONCENTRATED 500 UNIT/ML kwikpen Commonly known as:  HUMULIN R U-500 KWIKPEN   valsartan-hydrochlorothiazide 320-25 MG tablet Commonly known as:  DIOVAN-HCT   Zoster Vac Recomb Adjuvanted injection Commonly known as:  SHINGRIX     TAKE these medications   albuterol 108 (90 Base)  MCG/ACT inhaler Commonly known as:  PROVENTIL HFA;VENTOLIN HFA Inhale 2 puffs into the lungs every 6 (six) hours as needed for wheezing or shortness of breath.   amoxicillin-clavulanate 500-125 MG tablet Commonly known as:  AUGMENTIN Take 1 tablet (500 mg total) by mouth 3  (three) times daily.   aspirin EC 81 MG tablet Take 81 mg by mouth daily.   atorvastatin 20 MG tablet Commonly known as:  LIPITOR Take 1 tablet (20 mg total) by mouth daily.   clopidogrel 75 MG tablet Commonly known as:  PLAVIX Take 1 tablet (75 mg total) by mouth daily.   HYDROcodone-acetaminophen 5-325 MG tablet Commonly known as:  NORCO/VICODIN Take 1-2 tablets by mouth every 6 (six) hours as needed for moderate pain or severe pain.   insulin glargine 100 UNIT/ML injection Commonly known as:  LANTUS Inject 0.55 mLs (55 Units total) into the skin daily at 10 pm. Replaces:  Insulin Glargine 100 UNIT/ML Solostar Pen   Insulin Pen Needle 32G X 4 MM Misc Use up to 4x a day   metFORMIN 1000 MG tablet Commonly known as:  GLUCOPHAGE Take 1 tablet (1,000 mg total) by mouth 2 (two) times daily with a meal. Start taking on 10/10 What changed:  additional instructions   saccharomyces boulardii 250 MG capsule Commonly known as:  FLORASTOR Take 1 capsule (250 mg total) by mouth 2 (two) times daily. You can get a probiotic over the counter.   TRAVATAN Z 0.004 % Soln ophthalmic solution Generic drug:  Travoprost (BAK Free) Place 1 drop into both eyes at bedtime.   Vitamin D (Ergocalciferol) 50000 units Caps capsule Commonly known as:  DRISDOL TAKE ONE CAPSULE BY MOUTH TWICE A WEEK ON MONDAY AND THURSDAY What changed:  See the new instructions.      Follow-up Information    Rosalin Hawking, MD. Schedule an appointment as soon as possible for a visit in 6 day(s).   Specialty:  Neurology Why:  Stroke follow-up Contact information: [address] [place] [address] [phone]          No Known Allergies  Consultations:  Neurology  Cardiology   Procedures/Studies: Dg Chest 2 View  Result Date: 10/16/2016 CLINICAL DATA:  Stroke.  Left-sided weakness. EXAM: CHEST  2 VIEW COMPARISON:  02/06/2016 FINDINGS: The heart size and mediastinal contours are  within normal limits. Both lungs are clear. The visualized skeletal structures are unremarkable. IMPRESSION: No active cardiopulmonary disease. Electronically Signed   By: Lucienne Capers M.D.   On: 10/16/2016 00:54   Ct Head Wo Contrast  Result Date: 10/15/2016 CLINICAL DATA:  Left hand numbness for 2 days.  No reported injury. EXAM: CT HEAD WITHOUT CONTRAST TECHNIQUE: Contiguous axial images were obtained from the base of the skull through the vertex without intravenous contrast. COMPARISON:  03/04/2007 brain MRI. FINDINGS: Brain: Focal white matter hypodensity in the posterior right frontal deep white matter, new since 03/04/2007. No evidence of parenchymal hemorrhage or extra-axial fluid collection. No mass lesion, mass effect, or midline shift. Cerebral volume is age appropriate. No ventriculomegaly. Vascular: No acute abnormality. Skull: No evidence of calvarial fracture. Sinuses/Orbits: The visualized paranasal sinuses are essentially clear. Other:  The mastoid air cells are unopacified. IMPRESSION: 1. Focal white matter hypodensity in the posterior right frontal deep white matter, new since 03/04/2007 brain MRI, potentially representing a subacute white matter infarct. Brain MRI correlation could be obtained as clinically warranted. 2. No mass-effect.  No acute intracranial hemorrhage. Electronically Signed  By: Ilona Sorrel M.D.   On: 10/15/2016 20:32   Ct Angio Neck W Or Wo Contrast  Result Date: 10/18/2016 CLINICAL DATA:  Right MCA stroke EXAM: CT ANGIOGRAPHY NECK TECHNIQUE: Multidetector CT imaging of the neck was performed using the standard protocol during bolus administration of intravenous contrast. Multiplanar CT image reconstructions and MIPs were obtained to evaluate the vascular anatomy. Carotid stenosis measurements (when applicable) are obtained utilizing NASCET criteria, using the distal internal carotid diameter as the denominator. CONTRAST:  50 mL Isovue 370 COMPARISON:  Brain MRI  10/16/2016 FINDINGS: Aortic arch: There is atherosclerotic calcification within the aortic arch. There is a normal 3 vessel branching pattern. The proximal subclavian arteries are normal. Arch vessel origins are normal. Right carotid system: Normal common, internal and external carotid arteries. No atherosclerosis, stenosis or other abnormality. Left carotid system: Normal left common, internal and external carotid arteries. Vertebral arteries: The vertebral arteries are codominant. Both origins are widely patent. Both V1, V2, V3 and V4 segments are normal to the vertebrobasilar confluence. Bilateral anterior and posterior inferior cerebellar arteries are normal. Skeleton: No bony spinal canal stenosis. No lytic or blastic lesions. Multilevel cervical degenerative change. Other neck: Bilateral thyroid nodules, measuring up to 1 cm Upper chest: No nodules or masses.  No consolidation. IMPRESSION: 1. Normal CTA of the carotid and vertebral arterial systems. No embolic source is identified. 2. Minimal calcific aortic atherosclerosis (ICD10-I70.0). Electronically Signed   By: Ulyses Jarred M.D.   On: 10/18/2016 22:19   Mr Brain Wo Contrast  Result Date: 10/16/2016 CLINICAL DATA:  Left arm and hand weakness.  Slurred screw. EXAM: MRI HEAD WITHOUT CONTRAST MRA HEAD WITHOUT CONTRAST TECHNIQUE: Multiplanar, multiecho pulse sequences of the brain and surrounding structures were obtained without intravenous contrast. Angiographic images of the head were obtained using MRA technique without contrast. COMPARISON:  Head CT 10/15/2016 Brain MRI 03/04/2007 FINDINGS: MRI HEAD FINDINGS Brain: The midline structures are normal. There are numerous scattered foci of diffusion restriction with in the right MCA distribution. The largest area is in the right centrum semiovale and measures approximately 1.5 cm. There is no mass effect. No acute hemorrhage. There is multifocal cytotoxic edema associated with these lesions. There is  multifocal hyperintense T2-weighted signal within the periventricular white matter, most often seen in the setting of chronic microvascular ischemia. Single focus of chronic microhemorrhage in the left caudate head. No hydrocephalus, age advanced atrophy or lobar predominant volume loss. No dural abnormality or extra-axial collection. Skull and upper cervical spine: The visualized skull base, calvarium, upper cervical spine and extracranial soft tissues are normal. Sinuses/Orbits: No fluid levels or advanced mucosal thickening. No mastoid effusion. Normal orbits. MRA HEAD FINDINGS Intracranial internal carotid arteries: Normal. Anterior cerebral arteries: Normal. Middle cerebral arteries: The distal right M1 segment is occluded. This is distal to an early bifurcation. There is normal flow related enhancement distally within the right MCA distribution. The left middle cerebral artery is normal. Posterior communicating arteries: Absent bilaterally Posterior cerebral arteries: Normal. Basilar artery: Normal. Vertebral arteries: Codominant. Normal. Superior cerebellar arteries: Normal. Anterior inferior cerebellar arteries: Right-dominant Posterior inferior cerebellar arteries: Left dominant IMPRESSION: 1. Distal right middle cerebral artery M1 segment occlusion. 2. Multifocal acute ischemia within the right MCA distribution, with the largest area measuring 1.5 cm. 3. No acute hemorrhage or mass effect. Findings communicated via text page to Dr. Roland Rack at 2:06 a.m., 10/16/2016. Electronically Signed   By: Ulyses Jarred M.D.   On: 10/16/2016 02:06   Mr  Mra Head Wo Contrast  Result Date: 10/16/2016 CLINICAL DATA:  Left arm and hand weakness.  Slurred screw. EXAM: MRI HEAD WITHOUT CONTRAST MRA HEAD WITHOUT CONTRAST TECHNIQUE: Multiplanar, multiecho pulse sequences of the brain and surrounding structures were obtained without intravenous contrast. Angiographic images of the head were obtained using MRA  technique without contrast. COMPARISON:  Head CT 10/15/2016 Brain MRI 03/04/2007 FINDINGS: MRI HEAD FINDINGS Brain: The midline structures are normal. There are numerous scattered foci of diffusion restriction with in the right MCA distribution. The largest area is in the right centrum semiovale and measures approximately 1.5 cm. There is no mass effect. No acute hemorrhage. There is multifocal cytotoxic edema associated with these lesions. There is multifocal hyperintense T2-weighted signal within the periventricular white matter, most often seen in the setting of chronic microvascular ischemia. Single focus of chronic microhemorrhage in the left caudate head. No hydrocephalus, age advanced atrophy or lobar predominant volume loss. No dural abnormality or extra-axial collection. Skull and upper cervical spine: The visualized skull base, calvarium, upper cervical spine and extracranial soft tissues are normal. Sinuses/Orbits: No fluid levels or advanced mucosal thickening. No mastoid effusion. Normal orbits. MRA HEAD FINDINGS Intracranial internal carotid arteries: Normal. Anterior cerebral arteries: Normal. Middle cerebral arteries: The distal right M1 segment is occluded. This is distal to an early bifurcation. There is normal flow related enhancement distally within the right MCA distribution. The left middle cerebral artery is normal. Posterior communicating arteries: Absent bilaterally Posterior cerebral arteries: Normal. Basilar artery: Normal. Vertebral arteries: Codominant. Normal. Superior cerebellar arteries: Normal. Anterior inferior cerebellar arteries: Right-dominant Posterior inferior cerebellar arteries: Left dominant IMPRESSION: 1. Distal right middle cerebral artery M1 segment occlusion. 2. Multifocal acute ischemia within the right MCA distribution, with the largest area measuring 1.5 cm. 3. No acute hemorrhage or mass effect. Findings communicated via text page to Dr. Roland Rack at 2:06  a.m., 10/16/2016. Electronically Signed   By: Ulyses Jarred M.D.   On: 10/16/2016 02:06    Echocardiogram (10/16/16)  Study Conclusions  - Left ventricle: The cavity size was normal. There was moderate   focal basal hypertrophy of the septum. Systolic function was   normal. The estimated ejection fraction was in the range of 55%   to 60%. Wall motion was normal; there were no regional wall   motion abnormalities. Features are consistent with a pseudonormal   left ventricular filling pattern, with concomitant abnormal   relaxation and increased filling pressure (grade 2 diastolic   dysfunction). Doppler parameters are consistent with high   ventricular filling pressure. - Aortic valve: Transvalvular velocity was within the normal range.   There was no stenosis. There was no regurgitation. - Mitral valve: Transvalvular velocity was within the normal range.   There was no evidence for stenosis. There was trivial   regurgitation. - Right ventricle: The cavity size was normal. Wall thickness was   normal. Systolic function was normal. - Atrial septum: No defect or patent foramen ovale was identified. - Tricuspid valve: There was trivial regurgitation. - Pulmonary arteries: Systolic pressure was within the normal   range. PA peak pressure: 16 mm Hg (S).  Transesophageal echocardiogram (10/18/16)  Impressions:  - The Transesophageal Echocardiogram was terminated due to the   patient&'s inability to tolerate the procedure.   Subjective: No concerns today.  Discharge Exam: Vitals:   10/19/16 1019 10/19/16 1334  BP: (!) 115/56 (!) 130/59  Pulse: 71 79  Resp: 18 18  Temp: 98 F (36.7 C) 97.9 F (  36.6 C)  SpO2: 95% 98%   Vitals:   10/19/16 0100 10/19/16 0500 10/19/16 1019 10/19/16 1334  BP: (!) 120/45 (!) 122/58 (!) 115/56 (!) 130/59  Pulse: 84 73 71 79  Resp: 16 16 18 18   Temp: 98.6 F (37 C) 98.2 F (36.8 C) 98 F (36.7 C) 97.9 F (36.6 C)  TempSrc: Oral Oral Oral  Oral  SpO2: 100% 97% 95% 98%  Weight:      Height:        General: Pt is alert, awake, not in acute distress Cardiovascular: RRR, S1/S2 +, no rubs, no gallops Respiratory: CTA bilaterally, no wheezing, no rhonchi Abdominal: Soft, NT, ND, bowel sounds + Extremities: no edema, no cyanosis Neuro: left facial droop, 3/5 upper extremity strength with 3/5 grip strength. 5/5 in RUE and B LE.    The results of significant diagnostics from this hospitalization (including imaging, microbiology, ancillary and laboratory) are listed below for reference.     Microbiology: No results found for this or any previous visit (from the past 240 hour(s)).   Labs: BNP (last 3 results) No results for input(s): BNP in the last 8760 hours. Basic Metabolic Panel:  Recent Labs Lab 10/15/16 2033 10/15/16 2042 10/16/16 0030 10/16/16 0533  NA 138 139  --  138  K 4.0 3.8  --  3.4*  CL 104 103  --  106  CO2 24  --   --  24  GLUCOSE 335* 333*  --  230*  BUN 13 16  --  10  CREATININE 0.69 0.50  --  0.52  CALCIUM 9.7  --   --  8.9  MG  --   --  1.6*  --    Liver Function Tests:  Recent Labs Lab 10/15/16 2033 10/16/16 0533  AST 14* 14*  ALT 11* 8*  ALKPHOS 93 73  BILITOT 0.4 0.4  PROT 7.0 5.8*  ALBUMIN 3.5 2.8*   No results for input(s): LIPASE, AMYLASE in the last 168 hours. No results for input(s): AMMONIA in the last 168 hours. CBC:  Recent Labs Lab 10/15/16 2033 10/15/16 2042 10/16/16 0533  WBC 10.1  --  8.4  NEUTROABS 6.4  --   --   HGB 13.2 14.3 11.8*  HCT 39.8 42.0 36.1  MCV 92.8  --  92.6  PLT 363  --  330   Cardiac Enzymes:  Recent Labs Lab 10/16/16 0030 10/16/16 0533  TROPONINI <0.03 <0.03   BNP: Invalid input(s): POCBNP CBG:  Recent Labs Lab 10/18/16 1655 10/18/16 2125 10/19/16 0629 10/19/16 0648 10/19/16 1117  GLUCAP 238* 126* 67 73 153*   D-Dimer No results for input(s): DDIMER in the last 72 hours. Hgb A1c No results for input(s): HGBA1C in the  last 72 hours. Lipid Profile No results for input(s): CHOL, HDL, LDLCALC, TRIG, CHOLHDL, LDLDIRECT in the last 72 hours. Thyroid function studies No results for input(s): TSH, T4TOTAL, T3FREE, THYROIDAB in the last 72 hours.  Invalid input(s): FREET3 Anemia work up No results for input(s): VITAMINB12, FOLATE, FERRITIN, TIBC, IRON, RETICCTPCT in the last 72 hours. Urinalysis    Component Value Date/Time   COLORURINE YELLOW 10/15/2016 2053   APPEARANCEUR CLEAR 10/15/2016 2053   LABSPEC 1.030 10/15/2016 2053   PHURINE 5.0 10/15/2016 2053   GLUCOSEU >=500 (A) 10/15/2016 2053   HGBUR NEGATIVE 10/15/2016 2053   BILIRUBINUR NEGATIVE 10/15/2016 2053   BILIRUBINUR n 03/06/2015 0948   KETONESUR 5 (A) 10/15/2016 2053   PROTEINUR 100 (A) 10/15/2016 2053  UROBILINOGEN 1.0 03/06/2015 0948   UROBILINOGEN 1.0 09/23/2011 2130   NITRITE NEGATIVE 10/15/2016 2053   LEUKOCYTESUR NEGATIVE 10/15/2016 2053   Sepsis Labs Invalid input(s): PROCALCITONIN,  WBC,  LACTICIDVEN Microbiology No results found for this or any previous visit (from the past 240 hour(s)).   Time coordinating discharge: Over 30 minutes  SIGNED:   Cordelia Poche, MD [facility] 10/19/2016, 2:37 PM Pager [phone]  If 7PM-7AM, please contact night-coverage [email or web address] Password [email or web address]

## 2016-10-19 NOTE — Progress Notes (Signed)
STROKE TEAM PROGRESS NOTE   SUBJECTIVE (INTERVAL HISTORY) No family members present, pt is working with PT and walking in the hallway. She still has left UE weakness and left facial droop. CTA head and neck unremarkable. MRI brain pending. TEE unremarkable and loop recorder pending.    OBJECTIVE Temp:  [98 F (36.7 C)-98.9 F (37.2 C)] 98 F (36.7 C) (10/09 1019) Pulse Rate:  [71-103] 71 (10/09 1019) Cardiac Rhythm: Normal sinus rhythm (10/09 0400) Resp:  [15-32] 18 (10/09 1019) BP: (112-186)/(45-84) 115/56 (10/09 1019) SpO2:  [95 %-100 %] 95 % (10/09 1019)  CBC:   Recent Labs Lab 10/15/16 2033 10/15/16 2042 10/16/16 0533  WBC 10.1  --  8.4  NEUTROABS 6.4  --   --   HGB 13.2 14.3 11.8*  HCT 39.8 42.0 36.1  MCV 92.8  --  92.6  PLT 363  --  099    Basic Metabolic Panel:   Recent Labs Lab 10/15/16 2033 10/15/16 2042 10/16/16 0030 10/16/16 0533  NA 138 139  --  138  K 4.0 3.8  --  3.4*  CL 104 103  --  106  CO2 24  --   --  24  GLUCOSE 335* 333*  --  230*  BUN 13 16  --  10  CREATININE 0.69 0.50  --  0.52  CALCIUM 9.7  --   --  8.9  MG  --   --  1.6*  --     Lipid Panel:     Component Value Date/Time   CHOL 156 10/16/2016 0533   TRIG 104 10/16/2016 0533   HDL 37 (L) 10/16/2016 0533   CHOLHDL 4.2 10/16/2016 0533   VLDL 21 10/16/2016 0533   LDLCALC 98 10/16/2016 0533   HgbA1c:  Lab Results  Component Value Date   HGBA1C 12.9 (H) 10/16/2016   Urine Drug Screen:     Component Value Date/Time   LABOPIA NONE DETECTED 10/15/2016 2053   COCAINSCRNUR NONE DETECTED 10/15/2016 2053   LABBENZ NONE DETECTED 10/15/2016 2053   AMPHETMU NONE DETECTED 10/15/2016 2053   THCU NONE DETECTED 10/15/2016 2053   LABBARB NONE DETECTED 10/15/2016 2053    Alcohol Level     Component Value Date/Time   ETH <10 10/15/2016 2033    IMAGING I have personally reviewed the radiological images below and agree with the radiology interpretations.  Ct Head Wo  Contrast 10/15/2016 IMPRESSION:  1. Focal white matter hypodensity in the posterior right frontal deep white matter, new since 03/04/2007 brain MRI, potentially representing a subacute white matter infarct. Brain MRI correlation could be obtained as clinically warranted.  2. No mass-effect.  No acute intracranial hemorrhage.   Mr Jodene Nam Head Wo Contrast 10/16/2016  IMPRESSION:  1. Distal right middle cerebral artery M1 segment occlusion.  2. Multifocal acute ischemia within the right MCA distribution, with the largest area measuring 1.5 cm.  3. No acute hemorrhage or mass effect.   TTE - Left ventricle: The cavity size was normal. There was moderate   focal basal hypertrophy of the septum. Systolic function was   normal. The estimated ejection fraction was in the range of 55%   to 60%. Wall motion was normal; there were no regional wall   motion abnormalities. Features are consistent with a pseudonormal   left ventricular filling pattern, with concomitant abnormal   relaxation and increased filling pressure (grade 2 diastolic   dysfunction). Doppler parameters are consistent with high   ventricular filling pressure. - Aortic  valve: Transvalvular velocity was within the normal range.   There was no stenosis. There was no regurgitation. - Mitral valve: Transvalvular velocity was within the normal range.   There was no evidence for stenosis. There was trivial   regurgitation. - Right ventricle: The cavity size was normal. Wall thickness was   normal. Systolic function was normal. - Atrial septum: No defect or patent foramen ovale was identified. - Tricuspid valve: There was trivial regurgitation. - Pulmonary arteries: Systolic pressure was within the normal   range. PA peak pressure: 16 mm Hg (S).  Bilateral Carotid Dopplers - 40-59% right ICA and 1-39% left ICA stenosis  TEE LA, LA appendage without masses No PFO by color doppler or with injection of agitated saline  TV normal  AV  mildly thickened. MV normal   LVEF normal Mild fixed atherosclerotic plaque in thoracic aorta    CTA neck  1. Normal CTA of the carotid and vertebral arterial systems. No embolic source is identified. 2. Minimal calcific aortic atherosclerosis (ICD10-I70.0).  MRI head limited 10/19/16 1. Slight enlargement of an acute infarct in the right centrum semiovale. 2. Unchanged size of other patchy evolving right MCA territory infarcts.   PHYSICAL EXAM Vitals:   10/18/16 2147 10/19/16 0100 10/19/16 0500 10/19/16 1019  BP: 126/63 (!) 120/45 (!) 122/58 (!) 115/56  Pulse: 82 84 73 71  Resp: 18 16 16 18   Temp: 98.4 F (36.9 C) 98.6 F (37 C) 98.2 F (36.8 C) 98 F (36.7 C)  TempSrc: Oral Oral Oral Oral  SpO2: 97% 100% 97% 95%  Weight:      Height:        Temp:  [98 F (36.7 C)-98.9 F (37.2 C)] 98 F (36.7 C) (10/09 1019) Pulse Rate:  [71-103] 71 (10/09 1019) Resp:  [15-32] 18 (10/09 1019) BP: (112-186)/(45-84) 115/56 (10/09 1019) SpO2:  [95 %-100 %] 95 % (10/09 1019)  General - Well nourished, well developed, no acute distress.  Ophthalmologic - Sharp disc margins OU.  Cardiovascular - Regular rate and rhythm.  Mental Status -  Level of arousal and orientation to time, place, and person were intact. Language including expression, naming, repetition, comprehension was assessed and found intact. Fund of Knowledge was assessed and was intact.  Cranial Nerves II - XII - II - Visual field intact OU. III, IV, VI - Extraocular movements intact. V - Facial sensation intact bilaterally. VII - Left facial droop. VIII - Hearing & vestibular intact bilaterally. X - Palate elevates symmetrically. XI - Chin turning & shoulder shrug intact bilaterally. XII - Tongue protrusion intact.  Motor Strength - The patient's strength was normal in all extremities except left upper extremity 4/5 proximal, and 3/5 bicep and tricep and wrist extension, and 0/5 hand grip and pronator drift was  present on left.  Bulk was normal and fasciculations were absent.   Motor Tone - Muscle tone was assessed at the neck and appendages and was normal.  Reflexes - The patient's reflexes were 1+ in all extremities and she had no pathological reflexes.  Sensory - Light touch, temperature/pinprick were assessed and were symmetrical.    Coordination - The patient had normal movements in the arms and legs with no ataxia or dysmetria.  Tremor was absent.  Gait and Station - slow and cautions gait, but no hemiparetic gait.   ASSESSMENT/PLAN Ms. ACELYN BASHAM is a 72 y.o. female with history of diabetes mellitus, ongoing tobacco use, hyperlipidemia, hypertension,  presenting with left-sided weakness. She  did not receive IV t-PA due to late presentation. 10/18/16 with worsening left sided weakness.   Stroke:  Right middle cerebral artery strokes - embolic - source unknown, concerning for cardioembolic source but pt does have several risk factors including uncontrolled diabetes, HLD, smoker  Resultant  LUE weakness, left facial droop  CT head - Focal white matter hypodensity in the posterior right frontal deep white matter  MRI head - Multifocal acute ischemia within the right MCA distribution, with the largest area measuring 1.5 cm.   MRA head - Distal right middle cerebral artery M1 segment occlusion.   Carotid Doppler - 40-59% right ICA  CTA neck unremarkable  MRI brain repeat no significant change   2D Echo - EF 55-60%. No cardiac source of emboli identified.  TEE unremarkable  Loop recorder placed  LDL - 98  HgbA1c - 12.9  VTE prophylaxis - SCDs Diet heart healthy/carb modified Room service appropriate? Yes; Fluid consistency: Thin  aspirin 81 mg daily prior to admission, now on aspirin 325 mg daily and clopidogrel 75 mg daily. Continue DAPT for 3 months, and then Plavix alone.  Patient counseled to be compliant with her antithrombotic medications  Ongoing aggressive  stroke risk factor management  Therapy recommendations: - CIR  Disposition:  CIR today likely  Hypertension  Stable Permissive hypertension (OK if < 220/120) but gradually normalize in 5-7 days Overnight, BP on the low side BP meds on hold BP monitoring closely  Hyperlipidemia  Home meds:  Lipitor 20 mg daily resumed in hospital  LDL 98, goal < 70  Increase Lipitor to 40 mg daily  Continue statin at discharge  Diabetes  HgbA1c 12.9, goal < 7.0  Uncontrolled  On Lantus and metformin  SSI  CBG monitoring  PMH occasion and close PCP follow-up  Tobacco abuse  Current smoker  Smoking cessation counseling provided  Pt is willing to quit  Other Stroke Risk Factors  Advanced age  ETOH use, advised to drink no more than 1 drink per day.  Other Active Problems  Mild hypokalemia - 3.4 on supplement  Hospital day # 4  Neurology will sign off. Please call with questions. Pt will follow up with Dr. Erlinda Hong at [facility] in about 6 weeks. Thanks for the consult.   Rosalin Hawking, MD PhD Stroke Neurology 10/19/2016 11:49 AM   To contact Stroke Continuity provider, please refer to [email or web address] After hours, contact General Neurology

## 2016-10-19 NOTE — Progress Notes (Signed)
Pt arrived to unit approx 1850 with loop recorder and personal belongings. No family at bedside at time of transfer. Pt alert and oriented x 4, able to make needs known, call light in reach, SR up x 2 and bed alarm on and functioning.

## 2016-10-19 NOTE — Progress Notes (Signed)
Inpatient Diabetes Program Recommendations  AACE/ADA: New Consensus Statement on Inpatient Glycemic Control (2015)  Target Ranges:  Prepandial:   less than 140 mg/dL      Peak postprandial:   less than 180 mg/dL (1-2 hours)      Critically ill patients:  140 - 180 mg/dL   Results for SHERION, DOOLY (MRN [id number]) as of 10/19/2016 07:55  Ref. Range 10/18/2016 06:28 10/18/2016 16:55 10/18/2016 21:25 10/19/2016 06:29 10/19/2016 06:48  Glucose-Capillary Latest Ref Range: 65 - 99 mg/dL 131 (H) 238 (H) 126 (H) 67 73   Review of Glycemic Control  Diabetes history: DM2 Outpatient Diabetes medications: Lantus 60 units QHS, Humalog 20 units TID with meals, Metformin 1000 mg BID (not taking Metformin) Current orders for Inpatient glycemic control: Lantus 60 units QHS, Novolog 0-9 units TID with meals, Novolog 0-5 units QHS, Metformin 1000 mg BID, Novolog 10 units TID with meals  Inpatient Diabetes Program Recommendations:  Insulin-Basal: Fasting glucose 67 mg/dl today. Please consider decreasing Lantus to 57 units QHS.  Thanks, Barnie Alderman, RN, MSN, CDE Diabetes Coordinator Inpatient Diabetes Program [phone] (Team Pager from 8am to 5pm)

## 2016-10-19 NOTE — H&P (View-Only) (Signed)
Progress Note  Patient Name: Sarah [name] Date of Encounter: 10/19/2016  Primary Cardiologist: none  Subjective   No CP, palpitations or SOB, happy to be going to rehab today, c/w L arm weakness  Inpatient Medications    Scheduled Meds: . amoxicillin-clavulanate  1 tablet Oral TID  . aspirin EC  325 mg Oral Daily  . atorvastatin  40 mg Oral Daily  . clopidogrel  75 mg Oral Daily  . insulin aspart  0-5 Units Subcutaneous QHS  . insulin aspart  0-9 Units Subcutaneous TID WC  . insulin glargine  55 Units Subcutaneous Q2200  . latanoprost  1 drop Both Eyes QHS  . metFORMIN  1,000 mg Oral BID WC  . saccharomyces boulardii  250 mg Oral BID  . Vitamin D (Ergocalciferol)  50,000 Units Oral Once per day on Mon Thu   Continuous Infusions:  PRN Meds: acetaminophen **OR** acetaminophen (TYLENOL) oral liquid 160 mg/5 mL **OR** acetaminophen, albuterol, HYDROcodone-acetaminophen   Vital Signs    Vitals:   10/18/16 2147 10/19/16 0100 10/19/16 0500 10/19/16 1019  BP: 126/63 (!) 120/45 (!) 122/58 (!) 115/56  Pulse: 82 84 73 71  Resp: 18 16 16 18   Temp: 98.4 F (36.9 C) 98.6 F (37 C) 98.2 F (36.8 C) 98 F (36.7 C)  TempSrc: Oral Oral Oral Oral  SpO2: 97% 100% 97% 95%  Weight:      Height:        Intake/Output Summary (Last 24 hours) at 10/19/16 1152 Last data filed at 10/18/16 1804  Gross per 24 hour  Intake              120 ml  Output                0 ml  Net              120 ml   Filed Weights   10/15/16 1618 10/15/16 2251  Weight: 140 lb (63.5 kg) 139 lb 3.2 oz (63.1 kg)    Telemetry    Patient has been discontinued off telemetry this AM, in review of telemetry yesterday, to taht time, she had no AF, none reported by RN overnight - Personally Reviewed  ECG    No new EKGs, all epic EKGs were reviewed without AF noted - Personally Reviewed  Physical Exam   GEN: No acute distress.   Neck: No JVD Cardiac: RRR, no murmurs, rubs, or gallops.    Respiratory: Clear to auscultation bilaterally. GI: Soft, nontender, non-distended  MS: No edema; No deformity. Neuro:  L arm weakness, facial droop  Psych: Normal affect   Labs    Chemistry Recent Labs Lab 10/15/16 2033 10/15/16 2042 10/16/16 0533  NA 138 139 138  K 4.0 3.8 3.4*  CL 104 103 106  CO2 24  --  24  GLUCOSE 335* 333* 230*  BUN 13 16 10   CREATININE 0.69 0.50 0.52  CALCIUM 9.7  --  8.9  PROT 7.0  --  5.8*  ALBUMIN 3.5  --  2.8*  AST 14*  --  14*  ALT 11*  --  8*  ALKPHOS 93  --  73  BILITOT 0.4  --  0.4  GFRNONAA >60  --  >60  GFRAA >60  --  >60  ANIONGAP 10  --  8     Hematology Recent Labs Lab 10/15/16 2033 10/15/16 2042 10/16/16 0533  WBC 10.1  --  8.4  RBC 4.29  --  3.90  HGB 13.2 14.3 11.8*  HCT 39.8 42.0 36.1  MCV 92.8  --  92.6  MCH 30.8  --  30.3  MCHC 33.2  --  32.7  RDW 12.5  --  12.5  PLT 363  --  330    Cardiac Enzymes Recent Labs Lab 10/16/16 0030 10/16/16 0533  TROPONINI <0.03 <0.03    Recent Labs Lab 10/15/16 2040  TROPIPOC 0.01     BNPNo results for input(s): BNP, PROBNP in the last 168 hours.   DDimer No results for input(s): DDIMER in the last 168 hours.   Radiology    Ct Angio Neck W Or Wo Contrast Result Date: 10/18/2016 CLINICAL DATA:  Right MCA stroke EXAM: CT ANGIOGRAPHY NECK TECHNIQUE: Multidetector CT imaging of the neck was performed using the standard protocol during bolus administration of intravenous contrast. Multiplanar CT image reconstructions and MIPs were obtained to evaluate the vascular anatomy. Carotid stenosis measurements (when applicable) are obtained utilizing NASCET criteria, using the distal internal carotid diameter as the denominator. CONTRAST:  50 mL Isovue 370 COMPARISON:  Brain MRI 10/16/2016 FINDINGS: Aortic arch: There is atherosclerotic calcification within the aortic arch. There is a normal 3 vessel branching pattern. The proximal subclavian arteries are normal. Arch vessel origins  are normal. Right carotid system: Normal common, internal and external carotid arteries. No atherosclerosis, stenosis or other abnormality. Left carotid system: Normal left common, internal and external carotid arteries. Vertebral arteries: The vertebral arteries are codominant. Both origins are widely patent. Both V1, V2, V3 and V4 segments are normal to the vertebrobasilar confluence. Bilateral anterior and posterior inferior cerebellar arteries are normal. Skeleton: No bony spinal canal stenosis. No lytic or blastic lesions. Multilevel cervical degenerative change. Other neck: Bilateral thyroid nodules, measuring up to 1 cm Upper chest: No nodules or masses.  No consolidation. IMPRESSION: 1. Normal CTA of the carotid and vertebral arterial systems. No embolic source is identified. 2. Minimal calcific aortic atherosclerosis (ICD10-I70.0). Electronically Signed   By: Ulyses Jarred M.D.   On: 10/18/2016 22:19    Cardiac Studies   10/18/16 TEE LA, LA appendage without masses No PFO by color doppler or with injection of agitated saline  TV normal  AV mildly thickened. MV normal   LVEF normal Mild fixed atherosclerotic plaque in thoracic aorta    Echocardiogram this admission demonstrated  Study Conclusions - Left ventricle: The cavity size was normal. There was moderate focal basal hypertrophy of the septum. Systolic function was normal. The estimated ejection fraction was in the range of 55% to 60%. Wall motion was normal; there were no regional wall motion abnormalities. Features are consistent with a pseudonormal left ventricular filling pattern, with concomitant abnormal relaxation and increased filling pressure (grade 2 diastolic dysfunction). Doppler parameters are consistent with high ventricular filling pressure. - Aortic valve: Transvalvular velocity was within the normal range. There was no stenosis. There was no regurgitation. - Mitral valve: Transvalvular  velocity was within the normal range. There was no evidence for stenosis. There was trivial regurgitation. - Right ventricle: The cavity size was normal. Wall thickness was normal. Systolic function was normal. - Atrial septum: No defect or patent foramen ovale was identified. - Tricuspid valve: There was trivial regurgitation. - Pulmonary arteries: Systolic pressure was within the normal range. PA peak pressure: 16 mm Hg (S).  10/16/16: Carotid doppler Summary: Right: soft plaque throughout CCA and at origin ICA and ECA, creating a narrowing just past the ICA origin. 40-59% ICA stenosis. Left: mild soft plaque  noted origin ICA. 1-39% ICA stenosis. Vertebral artery flow is antegrade.  Patient Profile     71 y.o. female PMHx is HTN, HLD, DM, smoker, peripheral neuropathy, admitted with acute CVA, planned for discharge to CIR today.  Assessment & Plan    1. Cryptogenic stroke     TEE was done yesterday without emboli or PFO      Patient yesterday initially undecided about monitoring, today has decided she would like to go ahead with loop implant      She had no additional questions, we discussed the procedure, risks and benefits, she has no futrher questions and would like to proceed.      Wound care was discussed, wound check follow up will be arranged.    For questions or updates, please contact [place] Please consult [email or web address] for contact info under Cardiology/STEMI.      Signed, Baldwin [place], PA-C  10/19/2016, 11:52 AM     Risks and benefits of loop recorder discussed with the patient who wishes to proceed at this time.  Thompson Grayer MD, [facility] 10/19/2016 3:46 PM

## 2016-10-19 NOTE — Interval H&P Note (Signed)
History and Physical Interval Note:  10/19/2016 3:47 PM  Sarah [name]  has presented today for surgery, with the diagnosis of cva  The various methods of treatment have been discussed with the patient and family. After consideration of risks, benefits and other options for treatment, the patient has consented to  Procedure(s): LOOP RECORDER INSERTION (N/A) as a surgical intervention .  The patient's history has been reviewed, patient examined, no change in status, stable for surgery.  I have reviewed the patient's chart and labs.  Questions were answered to the patient's satisfaction.     Thompson Grayer

## 2016-10-19 NOTE — Progress Notes (Signed)
Inpatient Rehabilitation  Met with patient and sister to further discuss IP Rehab program.  Answered questions and they are agreeable to proceeding with an IP Rehab admission today following a loop recorder placement.  Have discussed it with team.  Please call with questions.   Carmelia Roller., CCC/SLP Admission Coordinator  [facility]  Cell [phone]

## 2016-10-19 NOTE — Progress Notes (Signed)
Physical Therapy Treatment Patient Details Name: Sarah [name] MRN: [id number] DOB: January 11, 1946 Today's Date: 10/19/2016    History of Present Illness Pt is a 71 y/o female admitted secondary to L facial droop and L sided weakness (UE>LE). MRI revealed a distal R MCA occlusion. PMH including but not limited to HTN, DM, HLD.    PT Comments    Patient is progressing toward mobility goals. The cane interfered with patient performance and became frustrating to her. The cane was removed and treatment focused on gait training for quality steps. VCs and TCs were given for step length and pacing. Patient demonstrated mild improvements but still demonstrates instability needing min assist to correct. CIR continues to be appropriate for skilled PT services to address deficits and maximize function. PT will continue to follow acutely and progress as tolerated.   Follow Up Recommendations  Supervision/Assistance - 24 hour;CIR     Equipment Recommendations  None recommended by PT    Recommendations for Other Services Rehab consult     Precautions / Restrictions Precautions Precautions: Fall Restrictions Weight Bearing Restrictions: No    Mobility  Bed Mobility Overal bed mobility: Needs Assistance Bed Mobility: Supine to Sit     Supine to sit: Modified independent (Device/Increase time)     General bed mobility comments: use of bed rails but no difficulty achieving EOB sitting from supine  Transfers Overall transfer level: Needs assistance Equipment used: None Transfers: Sit to/from Stand Sit to Stand: Min assist         General transfer comment: x1 from commode, x4 to chair; cues for proper UE placement. SPC and min A for balance once standing  Ambulation/Gait Ambulation/Gait assistance: Min guard;Min assist Ambulation Distance (Feet): 20 Feet (5 bouts of 20') Assistive device: None Gait Pattern/deviations: Step-to pattern;Step-through pattern;Decreased step length -  left;Decreased stride length;Decreased dorsiflexion - left;Decreased weight shift to left;Wide base of support Gait velocity: decreased   General Gait Details: SPC appeared to be more harmful than helpful to pt, took away and gait mechanics and speed improved. Able to acheive step through pattern with little circumduction of L LE occasionally. VCs and TCs for pacing and focus on quality steps. Mild instability noted throughout requiring min assist to correct, but small improvements with distance.    Stairs            Wheelchair Mobility    Modified Rankin (Stroke Patients Only) Modified Rankin (Stroke Patients Only) Pre-Morbid Rankin Score: No symptoms Modified Rankin: Moderate disability     Balance Overall balance assessment: Needs assistance Sitting-balance support: Feet supported;Bilateral upper extremity supported Sitting balance-Leahy Scale: Good     Standing balance support: During functional activity;No upper extremity supported Standing balance-Leahy Scale: Fair Standing balance comment: able to maintain static standing with sway, cannot tolerate challenge                            Cognition Arousal/Alertness: Awake/alert Behavior During Therapy: WFL for tasks assessed/performed Overall Cognitive Status: No family/caregiver present to determine baseline cognitive functioning Area of Impairment: Attention;Safety/judgement;Problem solving;Awareness                   Current Attention Level: Selective     Safety/Judgement: Decreased awareness of safety Awareness: Emergent Problem Solving: Requires tactile cues;Requires verbal cues        Exercises      General Comments        Pertinent Vitals/Pain Pain Assessment: No/denies pain  Home Living                      Prior Function            PT Goals (current goals can now be found in the care plan section) Acute Rehab PT Goals Patient Stated Goal: To go to rehab and  return to PLOF PT Goal Formulation: With patient/family Time For Goal Achievement: 10/30/16 Potential to Achieve Goals: Good Progress towards PT goals: Progressing toward goals    Frequency    Min 4X/week      PT Plan Current plan remains appropriate    Co-evaluation              AM-PAC PT "6 Clicks" Daily Activity  Outcome Measure  Difficulty turning over in bed (including adjusting bedclothes, sheets and blankets)?: None Difficulty moving from lying on back to sitting on the side of the bed? : None Difficulty sitting down on and standing up from a chair with arms (e.g., wheelchair, bedside commode, etc,.)?: A Lot Help needed moving to and from a bed to chair (including a wheelchair)?: A Lot Help needed walking in hospital room?: A Lot Help needed climbing 3-5 steps with a railing? : A Lot 6 Click Score: 16    End of Session Equipment Utilized During Treatment: Gait belt Activity Tolerance: Patient tolerated treatment well Patient left: in chair;with call bell/phone within reach Nurse Communication: Mobility status PT Visit Diagnosis: Other abnormalities of gait and mobility (R26.89);Other symptoms and signs involving the nervous system (R29.898)     Time: 1040-1104 PT Time Calculation (min) (ACUTE ONLY): 24 min  Charges:  $Gait Training: 8-22 mins                    G CodesSindy Guadel[place], SPT [phone] office    Margarita Grizzle 10/19/2016, 11:30 AM

## 2016-10-19 NOTE — Care Management Important Message (Signed)
Important Message  Patient Details  Name: Sarah [name] MRN: [id number] Date of Birth: 10/30/1945   Medicare Important Message Given:  Yes    Sarah [name] 10/19/2016, 9:30 AM

## 2016-10-19 NOTE — PMR Pre-admission (Signed)
PMR Admission Coordinator Pre-Admission Assessment  Patient: Sarah [name] is an 71 y.o., female MRN: [id number] DOB: July 23, 1945 Height: 5' (152.4 cm) Weight: 63.1 kg (139 lb 3.2 oz)              Insurance Information HMO: X    PPO:      PCP:      IPA:      80/20:      OTHER:  PRIMARY: [employer] Medicare       Policy#: [id number]      Subscriber: Self CM Name: Vevelyn Royals      Phone#: [phone]     Fax#: [phone] Pre-Cert#: [id number]      Employer: Retired  Benefits:  Phone #: Verified online     Name: Graham. Date: 01/12/16     Deduct: $0      Out of Pocket Max: $4400      Life Max: N/A CIR: $345 a day, days 1-5; $0 a day, days 6+      SNF: $0 a day, days 1-20; $160 a day, days 21-48; $0 a day days 49-100 Outpatient: PT/OT/SLP necessity     Co-Pay: $40 per visit  Home Health: 100% with necessity       Co-Pay: None DME: 80%     Co-Pay: 20% Providers: In-network   SECONDARY: None      Policy#:       Subscriber:  CM Name:       Phone#:      Fax#:  Pre-Cert#:       Employer:  Benefits:  Phone #:      Name:  Eff. Date:      Deduct:       Out of Pocket Max:       Life Max:  CIR:       SNF:  Outpatient:      Co-Pay:  Home Health:       Co-Pay:  DME:      Co-Pay:   Medicaid Application Date:       Case Manager:  Disability Application Date:       Case Worker:   Emergency Contact Information Contact Information    Name Relation Home Work [place] Mother [phone]     Divers,[name] Sister   [phone]     Current Medical History  Patient Admitting Diagnosis: Right MCA infarct  History of Present Illness: Sarah Newland Hendersonis a 71 y.o.right handed femalewith history of hypertension, hyperlipidemia, diabetes mellitus with peripheral neuropathy, and tobacco abuse. Presented 10/15/2016 with left-sided weakness and facial droop. Per chart review and patient, patient lives with elderly mother. Independent prior to admission. 2 level home. Her sister  from [place] to provide extended assistance as needed. Cranial CT reviewed, showing right frontal infarct. Per report, focal white matter hypodensity in the posterior right frontal deep white matter new since 03/04/2007. MRI/MRA showed no acute hemorrhage or mass effect. Multifocal acute ischemia within the right MCA distribution with the largest area measuring 1.5 cm. Distal right middle cerebral artery M1 segment occlusion. Patient did not receive TPA. Echocardiogram with ejection fraction of 32% grade 2 diastolic dysfunction. Carotid Dopplers with 40-59% right ICA stenosis. CT angiogram of the neck showed no emboli source identified. Neurology consulted and maintained on aspirin and Plavix for CVA prophylaxis. TEE showed no PFO without masses and a loop recorder was placed. Follow up MRI of the brain pending. Placed on empiric Augmentin 10/17/2016 for suspected dental abscess.  Physical and occupational therapy evaluations completed with recommendations of physical medicine rehabilitation consult and patient was admitted for a comprehensive rehabilitation program 10/19/16.   NIH Total: 4    Past Medical History  Past Medical History:  Diagnosis Date  . Boils   . Glaucoma   . Heart murmur   . HTN (hypertension)   . Hx of adenomatous colonic polyps   . Hyperlipidemia   . Osteoporosis   . Pneumonia   . Type II or unspecified type diabetes mellitus without mention of complication, uncontrolled     Family History  family history includes Diabetes in her mother; Hypertension in her mother; Stroke in her father.  Prior Rehab/Hospitalizations:  Has the patient had major surgery during 100 days prior to admission? No  Current Medications   Current Facility-Administered Medications:  .  acetaminophen (TYLENOL) tablet 650 mg, 650 mg, Oral, Q4H PRN, 650 mg at 10/17/16 0644 **OR** acetaminophen (TYLENOL) solution 650 mg, 650 mg, Per Tube, Q4H PRN **OR** acetaminophen (TYLENOL) suppository 650  mg, 650 mg, Rectal, Q4H PRN, Jani Gravel, MD .  albuterol (PROVENTIL) (2.5 MG/3ML) 0.083% nebulizer solution 3 mL, 3 mL, Inhalation, Q6H PRN, Jani Gravel, MD .  amoxicillin-clavulanate (AUGMENTIN) 500-125 MG per tablet 500 mg, 1 tablet, Oral, TID, Mariel Aloe, MD, 500 mg at 10/19/16 0925 .  aspirin EC tablet 325 mg, 325 mg, Oral, Daily, Jani Gravel, MD, 325 mg at 10/19/16 0925 .  atorvastatin (LIPITOR) tablet 40 mg, 40 mg, Oral, Daily, Rinehuls, David L, PA-C, 40 mg at 10/19/16 0925 .  clopidogrel (PLAVIX) tablet 75 mg, 75 mg, Oral, Daily, Jani Gravel, MD, 75 mg at 10/19/16 0925 .  HYDROcodone-acetaminophen (NORCO/VICODIN) 5-325 MG per tablet 1-2 tablet, 1-2 tablet, Oral, Q6H PRN, Mariel Aloe, MD, 2 tablet at 10/17/16 2218 .  insulin aspart (novoLOG) injection 0-5 Units, 0-5 Units, Subcutaneous, QHS, Jani Gravel, MD, 2 Units at 10/17/16 2219 .  insulin aspart (novoLOG) injection 0-9 Units, 0-9 Units, Subcutaneous, TID WC, Jani Gravel, MD, 3 Units at 10/18/16 1735 .  insulin glargine (LANTUS) injection 55 Units, 55 Units, Subcutaneous, Q2200, Nettey, Ralph A, MD .  latanoprost (XALATAN) 0.005 % ophthalmic solution 1 drop, 1 drop, Both Eyes, QHS, Jani Gravel, MD, 1 drop at 10/18/16 2213 .  metFORMIN (GLUCOPHAGE) tablet 1,000 mg, 1,000 mg, Oral, BID WC, Jani Gravel, MD, 1,000 mg at 10/19/16 0925 .  saccharomyces boulardii (FLORASTOR) capsule 250 mg, 250 mg, Oral, BID, Jani Gravel, MD, 250 mg at 10/19/16 0925 .  Vitamin D (Ergocalciferol) (DRISDOL) capsule 50,000 Units, 50,000 Units, Oral, Once per day on Mon Thu, Kim, James, MD, 50,000 Units at 10/18/16 0901  Patients Current Diet: Diet heart healthy/carb modified Room service appropriate? Yes; Fluid consistency: Thin  Precautions / Restrictions Precautions Precautions: Fall Restrictions Weight Bearing Restrictions: No   Has the patient had 2 or more falls or a fall with injury in the past year?No  Prior Activity Level Community (5-7x/wk):  Prior to admission patient was fully independent, lived with and cared for her mother, and provided transportation for one of her sisters.  She managed the household and was out in the community daily.    Home Assistive Devices / Equipment Home Assistive Devices/Equipment: None Home Equipment: Environmental consultant - 2 wheels, Cane - single point, Other (comment)  Prior Device Use: Indicate devices/aids used by the patient prior to current illness, exacerbation or injury? None of the above  Prior Functional Level Prior Function Level of Independence: Independent  Self Care:  Did the patient need help bathing, dressing, using the toilet or eating? Independent  Indoor Mobility: Did the patient need assistance with walking from room to room (with or without device)? Independent  Stairs: Did the patient need assistance with internal or external stairs (with or without device)? Independent  Functional Cognition: Did the patient need help planning regular tasks such as shopping or remembering to take medications? Independent  Current Functional Level Cognition  Arousal/Alertness: Lethargic Overall Cognitive Status: No family/caregiver present to determine baseline cognitive functioning Current Attention Level: Selective Orientation Level: Oriented X4 Safety/Judgement: Decreased awareness of safety General Comments: Pt able to draw a clock with good spatial awareness, however presented decreased spatial awareness during ADL task (standing on far left side of sink, hit head on paper towel dispencer). Pt completed letter elimination activity independently with good accuracy.  Attention: Selective Sustained Attention: Impaired Sustained Attention Impairment: Verbal complex Selective Attention: Impaired Selective Attention Impairment: Verbal basic, Verbal complex Memory: Appears intact Awareness: Impaired Awareness Impairment: Intellectual impairment Problem Solving: Impaired Problem Solving Impairment:  Verbal complex Executive Function: Reasoning Reasoning: Impaired Reasoning Impairment: Verbal complex Safety/Judgment: Impaired    Extremity Assessment (includes Sensation/Coordination)  Upper Extremity Assessment: LUE deficits/detail LUE Deficits / Details: grossly 3/5, max difficulty manipulating toiletry containter/lids, stronger promixal than distal. Reports sensation intact. LUE Coordination: decreased fine motor, decreased gross motor  Lower Extremity Assessment: Defer to PT evaluation LLE Deficits / Details: MMT revealed 3+/5 for hip flexion; 4/5 for knee flexion, knee extension and ankle DF    ADLs  Overall ADL's : Needs assistance/impaired Eating/Feeding: Set up, Sitting Grooming: Oral care, Minimal assistance, Standing Grooming Details (indicate cue type and reason): Pt requires min assist to manage tools needed for oral care due to decreased grip strength in L hand.  Upper Body Bathing: Minimal assistance, Sitting Lower Body Bathing: Sit to/from stand, Moderate assistance Upper Body Dressing : Minimal assistance, Sitting Lower Body Dressing: Min guard, Sitting/lateral leans Lower Body Dressing Details (indicate cue type and reason): Pt able to adjust socks sitting EOB.  Toilet Transfer: Minimal assistance, Horticulturist, commercial Details (indicate cue type and reason): HHA at times Toileting- Clothing Manipulation and Hygiene: Minimal assistance, Sit to/from stand Tub/ Shower Transfer: Moderate assistance, Tub transfer, Ambulation, 3 in 1 Functional mobility during ADLs: Minimal assistance General ADL Comments: Pt presents with difficulty with bimanual ADL tasks due to decreased functional use of LUE.     Mobility  Overal bed mobility: Needs Assistance Bed Mobility: Supine to Sit Supine to sit: Modified independent (Device/Increase time) Sit to supine: Supervision General bed mobility comments: use of bed rails but no difficulty achieving EOB sitting from supine     Transfers  Overall transfer level: Needs assistance Equipment used: None Transfers: Sit to/from Stand Sit to Stand: Min assist General transfer comment: x1 from commode, x4 to chair; cues for proper UE placement. SPC and min A for balance once standing    Ambulation / Gait / Stairs / Wheelchair Mobility  Ambulation/Gait Ambulation/Gait assistance: Min guard, Min assist Ambulation Distance (Feet): 20 Feet (5 bouts of 20') Assistive device: None Gait Pattern/deviations: Step-to pattern, Step-through pattern, Decreased step length - left, Decreased stride length, Decreased dorsiflexion - left, Decreased weight shift to left, Wide base of support General Gait Details: SPC appeared to be more harmful than helpful to pt, took away and gait mechanics and speed improved. Able to acheive step through pattern with little circumduction of L LE occasionally. VCs and TCs for pacing and focus on  quality steps. Mild instability noted throughout requiring min assist to correct, but small improvements with distance.  Gait velocity: decreased Gait velocity interpretation: Below normal speed for age/gender    Posture / Balance Dynamic Sitting Balance Sitting balance - Comments: able to don/doff socks sitting EOB with supervision Balance Overall balance assessment: Needs assistance Sitting-balance support: Feet supported, Bilateral upper extremity supported Sitting balance-Leahy Scale: Good Sitting balance - Comments: able to don/doff socks sitting EOB with supervision Standing balance support: During functional activity, No upper extremity supported Standing balance-Leahy Scale: Fair Standing balance comment: able to maintain static standing with sway, cannot tolerate challenge    Special needs/care consideration BiPAP/CPAP: No CPM: No Continuous Drip IV: No Dialysis: No        Life Vest: No Oxygen: No Special Bed: No Trach Size: No Wound Vac (area): No       Skin: WDL                               Bowel mgmt: Continent, last BM 10/7 per patient report  Bladder mgmt: Continent  Diabetic mgmt: Yes PTA; however, patient acknowledges she needs education in this area to reduce stroke risk oving forward      Previous Home Environment Living Arrangements: Parent Available Help at Discharge: Family, Available 24 hours/day Type of Home: House Home Layout: Two level Alternate Level Stairs-Rails: Right, Left, Can reach both Alternate Level Stairs-Number of Steps: flight Home Access: Stairs to enter Technical brewer of Steps: 1 Bathroom Shower/Tub: Tub/shower unit Home Care Services: No  Discharge Living Setting Plans for Discharge Living Setting: Patient's home, Lives with (comment) (Mother, Jalayna Josten) Type of Home at Discharge: House Discharge Home Layout: Two level, Able to live on main level with bedroom/bathroom (if needed mom usually stays on main level and can share ) Alternate Level Stairs-Rails: Right Alternate Level Stairs-Number of Steps: 15 Discharge Home Access: Stairs to enter Entrance Stairs-Rails: None Entrance Stairs-Number of Steps: 1 Discharge Bathroom Shower/Tub: Tub/shower unit, Walk-in shower (walk-in on main; tub/shower up stairs  ) Discharge Bathroom Toilet: Standard Discharge Bathroom Accessibility: Yes How Accessible: Accessible via walker Does the patient have any problems obtaining your medications?: No  Social/Family/Support Systems Patient Roles: Other (Comment) (Daughter and sister ) Sport and exercise psychologist Information: Sister (here to assist from [place]) [name] Shannon-Divers Anticipated Caregiver: [name]  Anticipated Caregiver's Contact Information: [phone] Ability/Limitations of Caregiver: None Caregiver Availability: 24/7 Discharge Plan Discussed with Primary Caregiver: Yes Is Caregiver In Agreement with Plan?: Yes Does Caregiver/Family have Issues with Lodging/Transportation while Pt is in Rehab?: No  Goals/Additional  Needs Patient/Family Goal for Rehab: PT/OT: Supervision; SLP: Mod I Expected length of stay: 6-10 days  Cultural Considerations: None Dietary Needs: Carb. Mod. and Heart Healthy  Equipment Needs: TBD Special Service Needs: None Additional Information: N/A Pt/Family Agrees to Admission and willing to participate: Yes Program Orientation Provided & Reviewed with Pt/Caregiver Including Roles  & Responsibilities: Yes Additional Information Needs: Patient could benefit from diabetes, carb counting, etc. education  Information Needs to be Provided By: Team FYI  Barriers to Discharge: New diabetic  Barriers to Discharge Comments: not a new diabetic but could benefit from ongoing education   Decrease burden of Care through IP rehab admission: No  Possible need for SNF placement upon discharge: No  Patient Condition: This patient's condition remains as documented in the consult dated 10/18/16 at 11:59 am, in which the Rehabilitation Physician determined and documented that the patient's  condition is appropriate for intensive rehabilitative care in an inpatient rehabilitation facility. Will admit to inpatient rehab today.  Preadmission Screen Completed By:  Gunnar Fusi, 10/19/2016 2:24 PM ______________________________________________________________________   Discussed status with Dr. Naaman Plummer on 10/19/16 at 1435 and received telephone approval for admission today.  Admission Coordinator:  Gunnar Fusi, time 1435/Date 10/19/16

## 2016-10-19 NOTE — Progress Notes (Signed)
Patient returned from Loop recorder placement. Assessed CBG= 61mg /dl-non symptomatic. OJ, peanut butter and crackers given- Re-checked CBG 97mg /dl.

## 2016-10-19 NOTE — Progress Notes (Signed)
Progress Note  Patient Name: MEERAB MASELLI Date of Encounter: 10/19/2016  Primary Cardiologist: none  Subjective   No CP, palpitations or SOB, happy to be going to rehab today, c/w L arm weakness  Inpatient Medications    Scheduled Meds: . amoxicillin-clavulanate  1 tablet Oral TID  . aspirin EC  325 mg Oral Daily  . atorvastatin  40 mg Oral Daily  . clopidogrel  75 mg Oral Daily  . insulin aspart  0-5 Units Subcutaneous QHS  . insulin aspart  0-9 Units Subcutaneous TID WC  . insulin glargine  55 Units Subcutaneous Q2200  . latanoprost  1 drop Both Eyes QHS  . metFORMIN  1,000 mg Oral BID WC  . saccharomyces boulardii  250 mg Oral BID  . Vitamin D (Ergocalciferol)  50,000 Units Oral Once per day on Mon Thu   Continuous Infusions:  PRN Meds: acetaminophen **OR** acetaminophen (TYLENOL) oral liquid 160 mg/5 mL **OR** acetaminophen, albuterol, HYDROcodone-acetaminophen   Vital Signs    Vitals:   10/18/16 2147 10/19/16 0100 10/19/16 0500 10/19/16 1019  BP: 126/63 (!) 120/45 (!) 122/58 (!) 115/56  Pulse: 82 84 73 71  Resp: 18 16 16 18   Temp: 98.4 F (36.9 C) 98.6 F (37 C) 98.2 F (36.8 C) 98 F (36.7 C)  TempSrc: Oral Oral Oral Oral  SpO2: 97% 100% 97% 95%  Weight:      Height:        Intake/Output Summary (Last 24 hours) at 10/19/16 1152 Last data filed at 10/18/16 1804  Gross per 24 hour  Intake              120 ml  Output                0 ml  Net              120 ml   Filed Weights   10/15/16 1618 10/15/16 2251  Weight: 140 lb (63.5 kg) 139 lb 3.2 oz (63.1 kg)    Telemetry    Patient has been discontinued off telemetry this AM, in review of telemetry yesterday, to taht time, she had no AF, none reported by RN overnight - Personally Reviewed  ECG    No new EKGs, all epic EKGs were reviewed without AF noted - Personally Reviewed  Physical Exam   GEN: No acute distress.   Neck: No JVD Cardiac: RRR, no murmurs, rubs, or gallops.    Respiratory: Clear to auscultation bilaterally. GI: Soft, nontender, non-distended  MS: No edema; No deformity. Neuro:  L arm weakness, facial droop  Psych: Normal affect   Labs    Chemistry Recent Labs Lab 10/15/16 2033 10/15/16 2042 10/16/16 0533  NA 138 139 138  K 4.0 3.8 3.4*  CL 104 103 106  CO2 24  --  24  GLUCOSE 335* 333* 230*  BUN 13 16 10   CREATININE 0.69 0.50 0.52  CALCIUM 9.7  --  8.9  PROT 7.0  --  5.8*  ALBUMIN 3.5  --  2.8*  AST 14*  --  14*  ALT 11*  --  8*  ALKPHOS 93  --  73  BILITOT 0.4  --  0.4  GFRNONAA >60  --  >60  GFRAA >60  --  >60  ANIONGAP 10  --  8     Hematology Recent Labs Lab 10/15/16 2033 10/15/16 2042 10/16/16 0533  WBC 10.1  --  8.4  RBC 4.29  --  3.90  HGB 13.2 14.3 11.8*  HCT 39.8 42.0 36.1  MCV 92.8  --  92.6  MCH 30.8  --  30.3  MCHC 33.2  --  32.7  RDW 12.5  --  12.5  PLT 363  --  330    Cardiac Enzymes Recent Labs Lab 10/16/16 0030 10/16/16 0533  TROPONINI <0.03 <0.03    Recent Labs Lab 10/15/16 2040  TROPIPOC 0.01     BNPNo results for input(s): BNP, PROBNP in the last 168 hours.   DDimer No results for input(s): DDIMER in the last 168 hours.   Radiology    Ct Angio Neck W Or Wo Contrast Result Date: 10/18/2016 CLINICAL DATA:  Right MCA stroke EXAM: CT ANGIOGRAPHY NECK TECHNIQUE: Multidetector CT imaging of the neck was performed using the standard protocol during bolus administration of intravenous contrast. Multiplanar CT image reconstructions and MIPs were obtained to evaluate the vascular anatomy. Carotid stenosis measurements (when applicable) are obtained utilizing NASCET criteria, using the distal internal carotid diameter as the denominator. CONTRAST:  50 mL Isovue 370 COMPARISON:  Brain MRI 10/16/2016 FINDINGS: Aortic arch: There is atherosclerotic calcification within the aortic arch. There is a normal 3 vessel branching pattern. The proximal subclavian arteries are normal. Arch vessel origins  are normal. Right carotid system: Normal common, internal and external carotid arteries. No atherosclerosis, stenosis or other abnormality. Left carotid system: Normal left common, internal and external carotid arteries. Vertebral arteries: The vertebral arteries are codominant. Both origins are widely patent. Both V1, V2, V3 and V4 segments are normal to the vertebrobasilar confluence. Bilateral anterior and posterior inferior cerebellar arteries are normal. Skeleton: No bony spinal canal stenosis. No lytic or blastic lesions. Multilevel cervical degenerative change. Other neck: Bilateral thyroid nodules, measuring up to 1 cm Upper chest: No nodules or masses.  No consolidation. IMPRESSION: 1. Normal CTA of the carotid and vertebral arterial systems. No embolic source is identified. 2. Minimal calcific aortic atherosclerosis (ICD10-I70.0). Electronically Signed   By: Ulyses Jarred M.D.   On: 10/18/2016 22:19    Cardiac Studies   10/18/16 TEE LA, LA appendage without masses No PFO by color doppler or with injection of agitated saline  TV normal  AV mildly thickened. MV normal   LVEF normal Mild fixed atherosclerotic plaque in thoracic aorta    Echocardiogram this admission demonstrated  Study Conclusions - Left ventricle: The cavity size was normal. There was moderate focal basal hypertrophy of the septum. Systolic function was normal. The estimated ejection fraction was in the range of 55% to 60%. Wall motion was normal; there were no regional wall motion abnormalities. Features are consistent with a pseudonormal left ventricular filling pattern, with concomitant abnormal relaxation and increased filling pressure (grade 2 diastolic dysfunction). Doppler parameters are consistent with high ventricular filling pressure. - Aortic valve: Transvalvular velocity was within the normal range. There was no stenosis. There was no regurgitation. - Mitral valve: Transvalvular  velocity was within the normal range. There was no evidence for stenosis. There was trivial regurgitation. - Right ventricle: The cavity size was normal. Wall thickness was normal. Systolic function was normal. - Atrial septum: No defect or patent foramen ovale was identified. - Tricuspid valve: There was trivial regurgitation. - Pulmonary arteries: Systolic pressure was within the normal range. PA peak pressure: 16 mm Hg (S).  10/16/16: Carotid doppler Summary: Right: soft plaque throughout CCA and at origin ICA and ECA, creating a narrowing just past the ICA origin. 40-59% ICA stenosis. Left: mild soft plaque  noted origin ICA. 1-39% ICA stenosis. Vertebral artery flow is antegrade.  Patient Profile     71 y.o. female PMHx is HTN, HLD, DM, smoker, peripheral neuropathy, admitted with acute CVA, planned for discharge to CIR today.  Assessment & Plan    1. Cryptogenic stroke     TEE was done yesterday without emboli or PFO      Patient yesterday initially undecided about monitoring, today has decided she would like to go ahead with loop implant      She had no additional questions, we discussed the procedure, risks and benefits, she has no futrher questions and would like to proceed.      Wound care was discussed, wound check follow up will be arranged.    For questions or updates, please contact [place] Please consult [email or web address] for contact info under Cardiology/STEMI.      Signed, Baldwin Jamaica, PA-C  10/19/2016, 11:52 AM     Risks and benefits of loop recorder discussed with the patient who wishes to proceed at this time.  Thompson Grayer MD, [facility] 10/19/2016 3:46 PM

## 2016-10-20 ENCOUNTER — Inpatient Hospital Stay (HOSPITAL_COMMUNITY): Payer: Medicare Other | Admitting: Speech Pathology

## 2016-10-20 ENCOUNTER — Inpatient Hospital Stay (HOSPITAL_COMMUNITY): Payer: Medicare Other | Admitting: Occupational Therapy

## 2016-10-20 ENCOUNTER — Encounter (HOSPITAL_COMMUNITY): Payer: Self-pay

## 2016-10-20 ENCOUNTER — Inpatient Hospital Stay (HOSPITAL_COMMUNITY): Payer: Medicare Other | Admitting: Physical Therapy

## 2016-10-20 LAB — CBC WITH DIFFERENTIAL/PLATELET
Basophils Absolute: 0 10*3/uL (ref 0.0–0.1)
Basophils Relative: 0 %
Eosinophils Absolute: 0.2 10*3/uL (ref 0.0–0.7)
Eosinophils Relative: 3 %
HCT: 35 % — ABNORMAL LOW (ref 36.0–46.0)
Hemoglobin: 11.5 g/dL — ABNORMAL LOW (ref 12.0–15.0)
Lymphocytes Relative: 39 %
Lymphs Abs: 2.9 10*3/uL (ref 0.7–4.0)
MCH: 30.4 pg (ref 26.0–34.0)
MCHC: 32.9 g/dL (ref 30.0–36.0)
MCV: 92.6 fL (ref 78.0–100.0)
Monocytes Absolute: 0.6 10*3/uL (ref 0.1–1.0)
Monocytes Relative: 8 %
Neutro Abs: 3.8 10*3/uL (ref 1.7–7.7)
Neutrophils Relative %: 50 %
Platelets: 325 10*3/uL (ref 150–400)
RBC: 3.78 MIL/uL — ABNORMAL LOW (ref 3.87–5.11)
RDW: 12.7 % (ref 11.5–15.5)
WBC: 7.5 10*3/uL (ref 4.0–10.5)

## 2016-10-20 LAB — COMPREHENSIVE METABOLIC PANEL
ALT: 9 U/L — ABNORMAL LOW (ref 14–54)
AST: 14 U/L — ABNORMAL LOW (ref 15–41)
Albumin: 2.7 g/dL — ABNORMAL LOW (ref 3.5–5.0)
Alkaline Phosphatase: 63 U/L (ref 38–126)
Anion gap: 8 (ref 5–15)
BUN: 16 mg/dL (ref 6–20)
CO2: 25 mmol/L (ref 22–32)
Calcium: 9.5 mg/dL (ref 8.9–10.3)
Chloride: 104 mmol/L (ref 101–111)
Creatinine, Ser: 0.56 mg/dL (ref 0.44–1.00)
GFR calc Af Amer: >60 mL/min (ref >60)
GFR calc non Af Amer: >60 mL/min (ref >60)
Glucose, Bld: 162 mg/dL — ABNORMAL HIGH (ref 65–99)
Potassium: 3.5 mmol/L (ref 3.5–5.1)
Sodium: 137 mmol/L (ref 135–145)
Total Bilirubin: 0.4 mg/dL (ref 0.3–1.2)
Total Protein: 5.7 g/dL — ABNORMAL LOW (ref 6.5–8.1)

## 2016-10-20 LAB — GLUCOSE, CAPILLARY
Glucose-Capillary: 169 mg/dL — ABNORMAL HIGH (ref 65–99)
Glucose-Capillary: 112 mg/dL — ABNORMAL HIGH (ref 65–99)
Glucose-Capillary: 170 mg/dL — ABNORMAL HIGH (ref 65–99)

## 2016-10-20 MED ORDER — MIDAZOLAM HCL 2 MG/2ML IJ SOLN
INTRAMUSCULAR | Status: AC
  Filled 2016-10-20: qty 2

## 2016-10-20 NOTE — Progress Notes (Signed)
Subjective/Complaints: Slept ok overnite Discussed social situation, from [place], moved to [place] x 50 yrs now back in [place] Sister will assist  ROS:  Denies CP, SOB, N/V/D  Objective: Vital Signs: Blood pressure 120/62, pulse 70, temperature 98.2 F (36.8 C), temperature source Oral, resp. rate 17, height 5' (1.524 m), weight 61.1 kg (134 lb 9.6 oz), SpO2 97 %. Ct Angio Neck W Or Wo Contrast  Result Date: 10/18/2016 CLINICAL DATA:  Right MCA stroke EXAM: CT ANGIOGRAPHY NECK TECHNIQUE: Multidetector CT imaging of the neck was performed using the standard protocol during bolus administration of intravenous contrast. Multiplanar CT image reconstructions and MIPs were obtained to evaluate the vascular anatomy. Carotid stenosis measurements (when applicable) are obtained utilizing NASCET criteria, using the distal internal carotid diameter as the denominator. CONTRAST:  50 mL Isovue 370 COMPARISON:  Brain MRI 10/16/2016 FINDINGS: Aortic arch: There is atherosclerotic calcification within the aortic arch. There is a normal 3 vessel branching pattern. The proximal subclavian arteries are normal. Arch vessel origins are normal. Right carotid system: Normal common, internal and external carotid arteries. No atherosclerosis, stenosis or other abnormality. Left carotid system: Normal left common, internal and external carotid arteries. Vertebral arteries: The vertebral arteries are codominant. Both origins are widely patent. Both V1, V2, V3 and V4 segments are normal to the vertebrobasilar confluence. Bilateral anterior and posterior inferior cerebellar arteries are normal. Skeleton: No bony spinal canal stenosis. No lytic or blastic lesions. Multilevel cervical degenerative change. Other neck: Bilateral thyroid nodules, measuring up to 1 cm Upper chest: No nodules or masses.  No consolidation. IMPRESSION: 1. Normal CTA of the carotid and vertebral arterial systems. No embolic source is identified. 2. Minimal  calcific aortic atherosclerosis (ICD10-I70.0). Electronically Signed   By: Ulyses Jarred M.D.   On: 10/18/2016 22:19   Mr Brain Wo Contrast  Result Date: 10/19/2016 CLINICAL DATA:  Worsening of left arm and hand weakness. Mild numbness and tingling. Dysarthria. EXAM: MRI HEAD WITHOUT CONTRAST TECHNIQUE: Multiplanar, multiecho pulse sequences of the brain and surrounding structures were obtained without intravenous contrast. COMPARISON:  10/16/2016 FINDINGS: Brain: Patchy right MCA territory infarcts are again seen and largely demonstrate stable to decreased trace diffusion signal abnormality. However, the largest confluent focus of infarction, located in the right centrum semiovale, has minimally enlarged (maximally 1.9 cm, previously 1.6 cm). No new infarcts are identified elsewhere. T2 hyperintensities in the periventricular white matter bilaterally are unchanged and nonspecific but compatible with chronic small vessel ischemic disease, mildly advanced for age. No intracranial hemorrhage, mass, midline shift, or extra-axial fluid collection is identified. Vascular: Major intracranial vascular flow voids are preserved. Skull and upper cervical spine: Unremarkable bone marrow signal. Sinuses/Orbits: Unremarkable orbits. Paranasal sinuses and mastoid air cells are clear. Other: None. IMPRESSION: 1. Slight enlargement of an acute infarct in the right centrum semiovale. 2. Unchanged size of other patchy evolving right MCA territory infarcts. Electronically Signed   By: Logan Bores M.D.   On: 10/19/2016 15:23   Results for orders placed or performed during the hospital encounter of 10/19/16 (from the past 72 hour(s))  Comprehensive metabolic panel     Status: Abnormal   Collection Time: 10/19/16 11:53 PM  Result Value Ref Range   Sodium 137 135 - 145 mmol/L   Potassium 3.5 3.5 - 5.1 mmol/L   Chloride 104 101 - 111 mmol/L   CO2 25 22 - 32 mmol/L   Glucose, Bld 162 (H) 65 - 99 mg/dL   BUN 16 6 - 20 mg/dL  Creatinine, Ser 0.56 0.44 - 1.00 mg/dL   Calcium 9.5 8.9 - 10.3 mg/dL   Total Protein 5.7 (L) 6.5 - 8.1 g/dL   Albumin 2.7 (L) 3.5 - 5.0 g/dL   AST 14 (L) 15 - 41 U/L   ALT 9 (L) 14 - 54 U/L   Alkaline Phosphatase 63 38 - 126 U/L   Total Bilirubin 0.4 0.3 - 1.2 mg/dL   GFR calc non Af Amer >60 >60 mL/min   GFR calc Af Amer >60 >60 mL/min    Comment: (NOTE) The eGFR has been calculated using the CKD EPI equation. This calculation has not been validated in all clinical situations. eGFR's persistently <60 mL/min signify possible Chronic Kidney Disease.    Anion gap 8 5 - 15  CBC WITH DIFFERENTIAL     Status: Abnormal   Collection Time: 10/20/16  6:23 AM  Result Value Ref Range   WBC 7.5 4.0 - 10.5 K/uL   RBC 3.78 (L) 3.87 - 5.11 MIL/uL   Hemoglobin 11.5 (L) 12.0 - 15.0 g/dL   HCT 35.0 (L) 36.0 - 46.0 %   MCV 92.6 78.0 - 100.0 fL   MCH 30.4 26.0 - 34.0 pg   MCHC 32.9 30.0 - 36.0 g/dL   RDW 12.7 11.5 - 15.5 %   Platelets 325 150 - 400 K/uL   Neutrophils Relative % 50 %   Neutro Abs 3.8 1.7 - 7.7 K/uL   Lymphocytes Relative 39 %   Lymphs Abs 2.9 0.7 - 4.0 K/uL   Monocytes Relative 8 %   Monocytes Absolute 0.6 0.1 - 1.0 K/uL   Eosinophils Relative 3 %   Eosinophils Absolute 0.2 0.0 - 0.7 K/uL   Basophils Relative 0 %   Basophils Absolute 0.0 0.0 - 0.1 K/uL     HEENT: poor dentition Cardio: RRR and no murmur Resp: CTA B/L and unlabored GI: BS positive and NT, ND Extremity:  No Edema Skin:   Intact Neuro: Alert/Oriented, Normal Sensory, Abnormal Motor 3/5 Left delt, bi, tri, 2- finger flex and ext, 3/5 L HF, KE, ADF, Abnormal FMC Ataxic/ dec FMC and Dysarthric Musc/Skel:  Normal Gen NAD   Assessment/Plan: 1. Functional deficits secondary to Left hemiparesis, R MCA infarct which require 3+ hours per day of interdisciplinary therapy in a comprehensive inpatient rehab setting. Physiatrist is providing close team supervision and 24 hour management of active medical  problems listed below. Physiatrist and rehab team continue to assess barriers to discharge/monitor patient progress toward functional and medical goals. FIM:          Function - Toilet Transfers Assist level to toilet: Touching or steadying assistance (Pt > 75%) Assist level from toilet: Touching or steadying assistance (Pt > 75%)        Function - Comprehension Comprehension: Auditory Comprehension assist level: Follows complex conversation/direction with extra time/assistive device  Function - Expression Expression: Verbal Expression assist level: Expresses basic needs/ideas: With no assist  Function - Social Interaction Social Interaction assist level: Interacts appropriately with others - No medications needed.     Function - Memory Memory assist level: Recognizes or recalls 90% of the time/requires cueing < 10% of the time  Medical Problem List and Plan: 1.  Left-sided weakness secondary to right MCA infarction. Status post loop recorder             -CIR PT, OT,SLP evals 2.  DVT Prophylaxis/Anticoagulation: SCD.Monitor for any signs of DVT 3. Pain Management: Hydrocodone as needed 4. Mood: Provide emotional  support 5. Neuropsych: This patient is capable of making decisions on her own behalf. 6. Skin/Wound Care: Routine skin checks 7. Fluids/Electrolytes/Nutrition: Routine I&O with follow up  BMET wnl except elevated glucose 8.Diabetes mellitus peripheral neuropathy.Hemoglobin A1c 12.9.Glucophage 1000 mg twice a day,NovoLog 10 units 3 times a day with meals, Lantus insulin 60 units daily. Check blood sugars before meals and at bedtime. Diabetic teaching Current meds are metformin 1068m BID, SSI only qhs, if am CBG remain elevated may need to restart low dose lantus qhs CBG (last 3)   Recent Labs  10/19/16 1117 10/19/16 1632 10/19/16 1701  GLUCAP 153* 61* 97    9.Hypertension.No current antihypertensive medication. Patient on Norvasc 10 mg daily, Diovan-HCT  320-25 milligrams daily prior to admission. Resume as needed 10. Hyperlipidemia. Lipitor 11. COPD/ Tobacco abuse. Counseling 12.Suspect dental abscess. Empiric Augmentin 500 mg 3 times a day initiated 10/17/2016   LOS (Days) 1 A FACE TO FACE EVALUATION WAS PERFORMED  Sarah [name] 10/20/2016, 8:28 AM

## 2016-10-20 NOTE — Patient Care Conference (Signed)
Inpatient RehabilitationTeam Conference and Plan of Care Update Date: 10/20/2016   Time: 11:30 AM    Patient Name: Sarah [name]      Medical Record Number: [id number]  Date of Birth: 04/12/1945 Sex: Female         Room/Bed: 4M13C/4M13C-01 Payor Info: Payor: [employer] MEDICARE / Plan: [employer] MEDICARE / Product Type: *No Product type* /    Admitting Diagnosis: mca cva  Admit Date/Time:  10/19/2016  6:48 PM Admission Comments: No comment available   Primary Diagnosis:  <principal problem not specified> Principal Problem: <principal problem not specified>  Patient Active Problem List   Diagnosis Date Noted  . Right middle cerebral artery stroke ([place]) 10/19/2016  . Left arm weakness   . Diastolic dysfunction   . Benign essential HTN   . Tobacco abuse   . Acute blood loss anemia   . Cerebral infarction due to embolism of right middle cerebral artery ([place])   . Stroke (cerebrum) ([place]) 10/15/2016  . Perirectal abscess 09/26/2015  . Vitamin D deficiency 07/23/2015  . Osteopenia 02/16/2013  . Ankle fracture 01/19/2013  . Anemia 09/27/2011  . Uncontrolled type 2 diabetes mellitus with insulin therapy ([place]) 09/24/2011  . Hyperlipidemia 09/24/2011  . Hypertension 09/24/2011    Expected Discharge Date: Expected Discharge Date: 10/28/16  Team Members Present: Physician leading conference: Dr. Alysia Penna Social Worker Present: Ovidio Kin, LCSW Nurse Present: Leonette Nutting, RN PT Present: Dwyane Dee, PT OT Present: Cherylynn Ridges, OT SLP Present: Stormy Fabian, SLP PPS Coordinator present : Daiva Nakayama, RN, CRRN     Current Status/Progress Goal Weekly Team Focus  Medical   Left hemiparesis, reduced by mouth intake  Adequate nutrition and hydration  Initiate rehabilitation program   Bowel/Bladder   cont of B/B; LBM 10/17/2016  maintain continence  assess for changes in continence q shift and prn   Swallow/Nutrition/ Hydration             ADL's   min  A overall for bathing at shower level, functional ambulation, dressing sit to stand with min A, Brunstrom V in UE  mod I overall   NMR left UE, functional ambulation, activity tolerance   Mobility     new eval        Communication             Safety/Cognition/ Behavioral Observations            Pain   c/o pain to loop recorder site L chest; oral pain  <2 out of 10  assess pain q shift and prn   Skin   CDI  maintain  assess skin q shift  and for changes      *See Care Plan and progress notes for long and short-term goals.     Barriers to Discharge  Current Status/Progress Possible Resolutions Date Resolved   Physician    Medical stability     Initiating therapy program  Continue rehabilitation      Nursing                  PT                    OT                  SLP                SW                Discharge  Planning/Teaching Needs:    Home with Mom and sister who is here to assist. Will have 24 hr supervision level at discharge     Team Discussion:  Goals supervision level, new eval today. Motivated to do well and very independent, emotional in Speech session regarding stroke and deficits.  Revisions to Treatment Plan:  New eval    Continued Need for Acute Rehabilitation Level of Care: The patient requires daily medical management by a physician with specialized training in physical medicine and rehabilitation for the following conditions: Daily direction of a multidisciplinary physical rehabilitation program to ensure safe treatment while eliciting the highest outcome that is of practical value to the patient.: Yes Daily medical management of patient stability for increased activity during participation in an intensive rehabilitation regime.: Yes Daily analysis of laboratory values and/or radiology reports with any subsequent need for medication adjustment of medical intervention for : Neurological problems;Nutritional problems  Earon Rivest, Gardiner Rhyme 10/21/2016, 8:30  AM

## 2016-10-20 NOTE — Progress Notes (Signed)
[facility] Admission Coordinator Signed Physical Medicine and Rehabilitation  PMR Pre-admission Date of Service: 10/19/2016 2:24 PM  Related encounter: ED to Hosp-Admission (Discharged) from 10/15/2016 in [place]       [] Hide copied text PMR Admission Coordinator Pre-Admission Assessment  Patient: Sarah [name] is an 71 y.o., female MRN: [id number] DOB: [birth year] Height: 5' (152.4 cm) Weight: 63.1 kg (139 lb 3.2 oz)                                                                                                                                                  Insurance Information HMO: X    PPO:      PCP:      IPA:      80/20:      OTHER:  PRIMARY: U[employer] Medicare       Policy#: [id number]      Subscriber: Self CM Name: Vevelyn Royals      Phone#: [phone]     Fax#: [phone] Pre-Cert#: [id number]      Employer: Retired  Benefits:  Phone #: Verified online     Name: [place]. Date: 01/12/16     Deduct: $0      Out of Pocket Max: $4400      Life Max: N/A CIR: $345 a day, days 1-5; $0 a day, days 6+      SNF: $0 a day, days 1-20; $160 a day, days 21-48; $0 a day days 49-100 Outpatient: PT/OT/SLP necessity     Co-Pay: $40 per visit  Home Health: 100% with necessity       Co-Pay: None DME: 80%     Co-Pay: 20% Providers: In-network   SECONDARY: None      Policy#:       Subscriber:  CM Name:       Phone#:      Fax#:  Pre-Cert#:       Employer:  Benefits:  Phone #:      Name:  Eff. Date:      Deduct:       Out of Pocket Max:       Life Max:  CIR:       SNF:  Outpatient:      Co-Pay:  Home Health:       Co-Pay:  DME:      Co-Pay:   Medicaid Application Date:       Case Manager:  Disability Application Date:       Case Worker:   Emergency Contact Information        Contact Information    Name Relation Home Work [place] Mother [phone]     Divers,[name] Sister   [phone]      Current Medical History  Patient Admitting Diagnosis: Right MCA infarct  History  of Present Illness: Sarah Paschal Hendersonis a 71 y.o.right handed femalewith history of hypertension, hyperlipidemia, diabetes mellitus with peripheral neuropathy, and tobacco abuse. Presented 10/15/2016 with left-sided weakness and facial droop. Per chart review and patient, patient lives with elderly mother. Independent prior to admission. 2 level home. Her sister from [place] to provide extended assistance as needed.Cranial CT reviewed, showing right frontal infarct. Per report, focal white matter hypodensity in the posterior right frontal deep white matter new since 03/04/2007. MRI/MRAshowed no acute hemorrhage or mass effect. Multifocal acute ischemia within the right MCA distribution with the largest area measuring 1.5 cm. Distal right middle cerebral artery M1 segment occlusion. Patient did not receive TPA. Echocardiogram with ejection fraction of 82% grade 2 diastolic dysfunction. Carotid Dopplers with 40-59% right ICA stenosis. CT angiogram of the neck showed no emboli source identified. Neurology consulted andmaintained on aspirin and Plavix for CVA prophylaxis. TEE showed no PFO without masses and a loop recorder was placed. Follow up MRI of the brain pending. Placed on empiric Augmentin 10/07/2018for suspected dental abscess. Physical and occupational therapy evaluations completed with recommendations of physical medicine rehabilitation consult and patient was admitted for a comprehensive rehabilitation program 10/19/16.   NIH Total: 4  Past Medical History      Past Medical History:  Diagnosis Date  . Boils   . Glaucoma   . Heart murmur   . HTN (hypertension)   . Hx of adenomatous colonic polyps   . Hyperlipidemia   . Osteoporosis   . Pneumonia   . Type II or unspecified type diabetes mellitus without mention of complication, uncontrolled     Family History  family  history includes Diabetes in her mother; Hypertension in her mother; Stroke in her father.  Prior Rehab/Hospitalizations:  Has the patient had major surgery during 100 days prior to admission? No  Current Medications   Current Facility-Administered Medications:  .  acetaminophen (TYLENOL) tablet 650 mg, 650 mg, Oral, Q4H PRN, 650 mg at 10/17/16 0644 **OR** acetaminophen (TYLENOL) solution 650 mg, 650 mg, Per Tube, Q4H PRN **OR** acetaminophen (TYLENOL) suppository 650 mg, 650 mg, Rectal, Q4H PRN, Jani Gravel, MD .  albuterol (PROVENTIL) (2.5 MG/3ML) 0.083% nebulizer solution 3 mL, 3 mL, Inhalation, Q6H PRN, Jani Gravel, MD .  amoxicillin-clavulanate (AUGMENTIN) 500-125 MG per tablet 500 mg, 1 tablet, Oral, TID, Mariel Aloe, MD, 500 mg at 10/19/16 0925 .  aspirin EC tablet 325 mg, 325 mg, Oral, Daily, Jani Gravel, MD, 325 mg at 10/19/16 0925 .  atorvastatin (LIPITOR) tablet 40 mg, 40 mg, Oral, Daily, Rinehuls, David L, PA-C, 40 mg at 10/19/16 0925 .  clopidogrel (PLAVIX) tablet 75 mg, 75 mg, Oral, Daily, Jani Gravel, MD, 75 mg at 10/19/16 0925 .  HYDROcodone-acetaminophen (NORCO/VICODIN) 5-325 MG per tablet 1-2 tablet, 1-2 tablet, Oral, Q6H PRN, Mariel Aloe, MD, 2 tablet at 10/17/16 2218 .  insulin aspart (novoLOG) injection 0-5 Units, 0-5 Units, Subcutaneous, QHS, Jani Gravel, MD, 2 Units at 10/17/16 2219 .  insulin aspart (novoLOG) injection 0-9 Units, 0-9 Units, Subcutaneous, TID WC, Jani Gravel, MD, 3 Units at 10/18/16 1735 .  insulin glargine (LANTUS) injection 55 Units, 55 Units, Subcutaneous, Q2200, Nettey, Ralph A, MD .  latanoprost (XALATAN) 0.005 % ophthalmic solution 1 drop, 1 drop, Both Eyes, QHS, Jani Gravel, MD, 1 drop at 10/18/16 2213 .  metFORMIN (GLUCOPHAGE) tablet 1,000 mg, 1,000 mg, Oral, BID WC, Jani Gravel, MD, 1,000 mg at 10/19/16 0925 .  saccharomyces boulardii (FLORASTOR) capsule 250 mg,  250 mg, Oral, BID, Jani Gravel, MD, 250 mg at 10/19/16 0925 .  Vitamin D  (Ergocalciferol) (DRISDOL) capsule 50,000 Units, 50,000 Units, Oral, Once per day on Mon Thu, Kim, James, MD, 50,000 Units at 10/18/16 0901  Patients Current Diet: Diet heart healthy/carb modified Room service appropriate? Yes; Fluid consistency: Thin  Precautions / Restrictions Precautions Precautions: Fall Restrictions Weight Bearing Restrictions: No   Has the patient had 2 or more falls or a fall with injury in the past year?No  Prior Activity Level Community (5-7x/wk): Prior to admission patient was fully independent, lived with and cared for her mother, and provided transportation for one of her sisters.  She managed the household and was out in the community daily.    Home Assistive Devices / Equipment Home Assistive Devices/Equipment: None Home Equipment: Environmental consultant - 2 wheels, Cane - single point, Other (comment)  Prior Device Use: Indicate devices/aids used by the patient prior to current illness, exacerbation or injury? None of the above  Prior Functional Level Prior Function Level of Independence: Independent  Self Care: Did the patient need help bathing, dressing, using the toilet or eating? Independent  Indoor Mobility: Did the patient need assistance with walking from room to room (with or without device)? Independent  Stairs: Did the patient need assistance with internal or external stairs (with or without device)? Independent  Functional Cognition: Did the patient need help planning regular tasks such as shopping or remembering to take medications? Independent  Current Functional Level Cognition  Arousal/Alertness: Lethargic Overall Cognitive Status: No family/caregiver present to determine baseline cognitive functioning Current Attention Level: Selective Orientation Level: Oriented X4 Safety/Judgement: Decreased awareness of safety General Comments: Pt able to draw a clock with good spatial awareness, however presented decreased spatial awareness  during ADL task (standing on far left side of sink, hit head on paper towel dispencer). Pt completed letter elimination activity independently with good accuracy.  Attention: Selective Sustained Attention: Impaired Sustained Attention Impairment: Verbal complex Selective Attention: Impaired Selective Attention Impairment: Verbal basic, Verbal complex Memory: Appears intact Awareness: Impaired Awareness Impairment: Intellectual impairment Problem Solving: Impaired Problem Solving Impairment: Verbal complex Executive Function: Reasoning Reasoning: Impaired Reasoning Impairment: Verbal complex Safety/Judgment: Impaired    Extremity Assessment (includes Sensation/Coordination)  Upper Extremity Assessment: LUE deficits/detail LUE Deficits / Details: grossly 3/5, max difficulty manipulating toiletry containter/lids, stronger promixal than distal. Reports sensation intact. LUE Coordination: decreased fine motor, decreased gross motor  Lower Extremity Assessment: Defer to PT evaluation LLE Deficits / Details: MMT revealed 3+/5 for hip flexion; 4/5 for knee flexion, knee extension and ankle DF    ADLs  Overall ADL's : Needs assistance/impaired Eating/Feeding: Set up, Sitting Grooming: Oral care, Minimal assistance, Standing Grooming Details (indicate cue type and reason): Pt requires min assist to manage tools needed for oral care due to decreased grip strength in L hand.  Upper Body Bathing: Minimal assistance, Sitting Lower Body Bathing: Sit to/from stand, Moderate assistance Upper Body Dressing : Minimal assistance, Sitting Lower Body Dressing: Min guard, Sitting/lateral leans Lower Body Dressing Details (indicate cue type and reason): Pt able to adjust socks sitting EOB.  Toilet Transfer: Minimal assistance, Horticulturist, commercial Details (indicate cue type and reason): HHA at times Toileting- Clothing Manipulation and Hygiene: Minimal assistance, Sit to/from stand Tub/ Shower  Transfer: Moderate assistance, Tub transfer, Ambulation, 3 in 1 Functional mobility during ADLs: Minimal assistance General ADL Comments: Pt presents with difficulty with bimanual ADL tasks due to decreased functional use of LUE.  Mobility  Overal bed mobility: Needs Assistance Bed Mobility: Supine to Sit Supine to sit: Modified independent (Device/Increase time) Sit to supine: Supervision General bed mobility comments: use of bed rails but no difficulty achieving EOB sitting from supine    Transfers  Overall transfer level: Needs assistance Equipment used: None Transfers: Sit to/from Stand Sit to Stand: Min assist General transfer comment: x1 from commode, x4 to chair; cues for proper UE placement. SPC and min A for balance once standing    Ambulation / Gait / Stairs / Wheelchair Mobility  Ambulation/Gait Ambulation/Gait assistance: Min guard, Min assist Ambulation Distance (Feet): 20 Feet (5 bouts of 20') Assistive device: None Gait Pattern/deviations: Step-to pattern, Step-through pattern, Decreased step length - left, Decreased stride length, Decreased dorsiflexion - left, Decreased weight shift to left, Wide base of support General Gait Details: SPC appeared to be more harmful than helpful to pt, took away and gait mechanics and speed improved. Able to acheive step through pattern with little circumduction of L LE occasionally. VCs and TCs for pacing and focus on quality steps. Mild instability noted throughout requiring min assist to correct, but small improvements with distance.  Gait velocity: decreased Gait velocity interpretation: Below normal speed for age/gender    Posture / Balance Dynamic Sitting Balance Sitting balance - Comments: able to don/doff socks sitting EOB with supervision Balance Overall balance assessment: Needs assistance Sitting-balance support: Feet supported, Bilateral upper extremity supported Sitting balance-Leahy Scale: Good Sitting balance  - Comments: able to don/doff socks sitting EOB with supervision Standing balance support: During functional activity, No upper extremity supported Standing balance-Leahy Scale: Fair Standing balance comment: able to maintain static standing with sway, cannot tolerate challenge    Special needs/care consideration BiPAP/CPAP: No CPM: No Continuous Drip IV: No Dialysis: No        Life Vest: No Oxygen: No Special Bed: No Trach Size: No Wound Vac (area): No       Skin: WDL                              Bowel mgmt: Continent, last BM 10/7 per patient report  Bladder mgmt: Continent  Diabetic mgmt: Yes PTA; however, patient acknowledges she needs education in this area to reduce stroke risk oving forward      Previous Home Environment Living Arrangements: Parent Available Help at Discharge: Family, Available 24 hours/day Type of Home: House Home Layout: Two level Alternate Level Stairs-Rails: Right, Left, Can reach both Alternate Level Stairs-Number of Steps: flight Home Access: Stairs to enter Technical brewer of Steps: 1 Bathroom Shower/Tub: Tub/shower unit Home Care Services: No  Discharge Living Setting Plans for Discharge Living Setting: Patient's home, Lives with (comment) (Mother, Sapir Lavey) Type of Home at Discharge: House Discharge Home Layout: Two level, Able to live on main level with bedroom/bathroom (if needed mom usually stays on main level and can share ) Alternate Level Stairs-Rails: Right Alternate Level Stairs-Number of Steps: 15 Discharge Home Access: Stairs to enter Entrance Stairs-Rails: None Entrance Stairs-Number of Steps: 1 Discharge Bathroom Shower/Tub: Tub/shower unit, Walk-in shower (walk-in on main; tub/shower up stairs  ) Discharge Bathroom Toilet: Standard Discharge Bathroom Accessibility: Yes How Accessible: Accessible via walker Does the patient have any problems obtaining your medications?: No  Social/Family/Support  Systems Patient Roles: Other (Comment) (Daughter and sister ) Contact Information: Sister (here to assist from [place]) [place] Anticipated Caregiver: [name]  Anticipated Caregiver's Contact Information: [phone] Ability/Limitations of  Caregiver: None Caregiver Availability: 24/7 Discharge Plan Discussed with Primary Caregiver: Yes Is Caregiver In Agreement with Plan?: Yes Does Caregiver/Family have Issues with Lodging/Transportation while Pt is in Rehab?: No  Goals/Additional Needs Patient/Family Goal for Rehab: PT/OT: Supervision; SLP: Mod I Expected length of stay: 6-10 days  Cultural Considerations: None Dietary Needs: Carb. Mod. and Heart Healthy  Equipment Needs: TBD Special Service Needs: None Additional Information: N/A Pt/Family Agrees to Admission and willing to participate: Yes Program Orientation Provided & Reviewed with Pt/Caregiver Including Roles  & Responsibilities: Yes Additional Information Needs: Patient could benefit from diabetes, carb counting, etc. education  Information Needs to be Provided By: Team FYI  Barriers to Discharge: New diabetic  Barriers to Discharge Comments: not a new diabetic but could benefit from ongoing education   Decrease burden of Care through IP rehab admission: No  Possible need for SNF placement upon discharge: No  Patient Condition: This patient's condition remains as documented in the consult dated 10/18/16 at 11:59 am, in which the Rehabilitation Physician determined and documented that the patient's condition is appropriate for intensive rehabilitative care in an inpatient rehabilitation facility. Will admit to inpatient rehab today.  Preadmission Screen Completed By:  Gunnar Fusi, 10/19/2016 2:24 PM ______________________________________________________________________   Discussed status with Dr. Naaman Plummer on 10/19/16 at 1435 and received telephone approval for admission today.  Admission Coordinator:  Gunnar Fusi, time 1435/Date 10/19/16       Cosigned by: Meredith Staggers, MD at 10/19/2016 3:15 PM  Revision History

## 2016-10-20 NOTE — Progress Notes (Signed)
Social Work Assessment and Plan Social Work Assessment and Plan  Patient Details  Name: Sarah [name] MRN: [id number] Date of Birth: [birth year]  Today's Date: 10/20/2016  Problem List:  Patient Active Problem List   Diagnosis Date Noted  . Right middle cerebral artery stroke ([place]) 10/19/2016  . Left arm weakness   . Diastolic dysfunction   . Benign essential HTN   . Tobacco abuse   . Acute blood loss anemia   . Cerebral infarction due to embolism of right middle cerebral artery ([place])   . Stroke (cerebrum) ([place]) 10/15/2016  . Perirectal abscess 09/26/2015  . Vitamin D deficiency 07/23/2015  . Osteopenia 02/16/2013  . Ankle fracture 01/19/2013  . Anemia 09/27/2011  . Uncontrolled type 2 diabetes mellitus with insulin therapy ([place]) 09/24/2011  . Hyperlipidemia 09/24/2011  . Hypertension 09/24/2011   Past Medical History:  Past Medical History:  Diagnosis Date  . Boils   . Glaucoma   . Heart murmur   . HTN (hypertension)   . Hx of adenomatous colonic polyps   . Hyperlipidemia   . Osteoporosis   . Pneumonia   . Type II or unspecified type diabetes mellitus without mention of complication, uncontrolled    Past Surgical History:  Past Surgical History:  Procedure Laterality Date  . ABDOMINAL HYSTERECTOMY    . CARPAL TUNNEL RELEASE     right  . COLONOSCOPY  12-10-10   per Dr. Deatra Ina, clear, repeat in 7 yrs   . INCISION AND DRAINAGE PERIRECTAL ABSCESS N/A 09/26/2015   Procedure: IRRIGATION AND DEBRIDEMENT PERIRECTAL ABSCESS;  Surgeon: Mickeal Skinner, MD;  Location: [facility];  Service: General;  Laterality: N/A;  . KNEE ARTHROSCOPY     right  . LOOP RECORDER INSERTION N/A 10/19/2016   Procedure: LOOP RECORDER INSERTION;  Surgeon: Thompson Grayer, MD;  Location: [facility];  Service: Cardiovascular;  Laterality: N/A;  . ORIF ANKLE FRACTURE Right 03/24/2012   Procedure: OPEN REDUCTION INTERNAL FIXATION (ORIF) ANKLE FRACTURE;  Surgeon: Newt Minion, MD;   Location: [facility];  Service: Orthopedics;  Laterality: Right;  Open Reduction Internal Fixation Right Bimalleolar ankle fracture  . POLYPECTOMY    . TEE WITHOUT CARDIOVERSION N/A 10/18/2016   Procedure: TRANSESOPHAGEAL ECHOCARDIOGRAM (TEE);  Surgeon: Fay Records, MD;  Location: [facility];  Service: Cardiovascular;  Laterality: N/A;  . TONSILLECTOMY     Social History:  reports that she has been smoking.  She has never used smokeless tobacco. She reports that she drinks alcohol. She reports that she does not use drugs.  Family / Support Systems Marital Status: Single Patient Roles: Parent, Other (Comment), Caregiver (sibling) Other Supports: Sarah [name]-mom [phone]-cell  Sarah [name]-sister [phone] Anticipated Caregiver: Sarah Ability/Limitations of Caregiver: None-here for as long as needed Caregiver Availability: 24/7 Family Dynamics: Close knit family who are there for one another. Her sister is local and Sarah [name] is here to help, she was coming here for a reunion anyway and then this happened. She has social supports who visit her, she just is very independent and wants to remain so.  Social History Preferred language: English Religion: Christian Cultural Background: No issues Education: Some college Read: Yes Write: Yes Employment Status: Retired Freight forwarder Issues: No issues Guardian/Conservator: None-according to MD pt is capable of making her own decisions while here.   Abuse/Neglect Physical Abuse: Denies Verbal Abuse: Denies Sexual Abuse: Denies Exploitation of patient/patient's resources: Denies Self-Neglect: Denies  Emotional Status Pt's affect, behavior adn adjustment status: Pt is motivated  and tearful at times she wants to be independent and is usually the one helping others. She will do her best to reach her goals she has set for herself. She has made progress already and is hopeful this will continue. Recent Psychosocial Issues: other health  issues was doing well prior to this event Pyschiatric History: No history do feel she would benefit from seeing neuro-psych while here for coping. She is coping appropriately and is tearful which is normal and informed her of this. Will make referral and follow for support. Substance Abuse History: Tobacco aware needs to quit smoking and plans to do this. Is aware of the resources out there for her  Patient / Family Perceptions, Expectations & Goals Pt/Family understanding of illness & functional limitations: Pt and sister have spoken with MD and feel they have a good understanding of her deficits and stroke. Pt talks with MD daily and feels her questions have been answered.  Premorbid pt/family roles/activities: Sibling, daughter, retiree, friend, caregiver to Mom. Anticipated changes in roles/activities/participation: resume Pt/family expectations/goals: Pt states: " I want to be able to take care of myself, I have always done this and wouldn't know what to do."  Sister states: " I hope she does well she does not like relying upon others, that's just the way she is."  [employer]: None Premorbid Home Care/DME Agencies: None Transportation available at discharge: Sister Resource referrals recommended: Neuropsychology, Support group (specify)  Discharge Planning Living Arrangements: Parent Support Systems: Parent, Other relatives, Friends/neighbors Type of Residence: Private residence Insurance underwriter Resources: Multimedia programmer (specify) Primary school teacher) Financial Resources: Radio broadcast assistant Screen Referred: No Living Expenses: Own Money Management: Patient Does the patient have any problems obtaining your medications?: No Home Management: Patient does the home management Patient/Family Preliminary Plans: Return home with Mom and sister-Sarah who is here from [place] to assist, if needed. Pt is motivated and should do well here since she is high level. Will  await therapy team's evaluations and work on discharge needs.  Clinical Impression Pleasant motivated patient who has always been independent and taken care of others. Her sister's are involved and supportive, her Mom is here but can not assist her. Will await team's evaluations and work on discharge needs. Would benefit from seeing neuro-psych while here.   Elease Hashimoto 10/20/2016, 3:19 PM

## 2016-10-20 NOTE — Evaluation (Signed)
Physical Therapy Assessment and Plan  Patient Details  Name: Sarah [name] MRN: [id number] Date of Birth: Mar 02, 1945  PT Diagnosis: Abnormality of gait, Coordination disorder, Difficulty walking, Hemiplegia non-dominant, Impaired cognition, Impaired sensation and Muscle weakness Rehab Potential: Excellent ELOS: 7-10 days   Today's Date: 10/20/2016 PT Individual Time: 0900-1015 PT Individual Time Calculation (min): 75 min    Problem List:  Patient Active Problem List   Diagnosis Date Noted  . Right middle cerebral artery stroke ([place]) 10/19/2016  . Left arm weakness   . Diastolic dysfunction   . Benign essential HTN   . Tobacco abuse   . Acute blood loss anemia   . Cerebral infarction due to embolism of right middle cerebral artery ([place])   . Stroke (cerebrum) ([place]) 10/15/2016  . Perirectal abscess 09/26/2015  . Vitamin D deficiency 07/23/2015  . Osteopenia 02/16/2013  . Ankle fracture 01/19/2013  . Anemia 09/27/2011  . Uncontrolled type 2 diabetes mellitus with insulin therapy ([place]) 09/24/2011  . Hyperlipidemia 09/24/2011  . Hypertension 09/24/2011    Past Medical History:  Past Medical History:  Diagnosis Date  . Boils   . Glaucoma   . Heart murmur   . HTN (hypertension)   . Hx of adenomatous colonic polyps   . Hyperlipidemia   . Osteoporosis   . Pneumonia   . Type II or unspecified type diabetes mellitus without mention of complication, uncontrolled    Past Surgical History:  Past Surgical History:  Procedure Laterality Date  . ABDOMINAL HYSTERECTOMY    . CARPAL TUNNEL RELEASE     right  . COLONOSCOPY  12-10-10   per Dr. Deatra Ina, clear, repeat in 7 yrs   . INCISION AND DRAINAGE PERIRECTAL ABSCESS N/A 09/26/2015   Procedure: IRRIGATION AND DEBRIDEMENT PERIRECTAL ABSCESS;  Surgeon: Mickeal Skinner, MD;  Location: [place];  Service: General;  Laterality: N/A;  . KNEE ARTHROSCOPY     right  . LOOP RECORDER INSERTION N/A 10/19/2016   Procedure: LOOP  RECORDER INSERTION;  Surgeon: Thompson Grayer, MD;  Location: [facility];  Service: Cardiovascular;  Laterality: N/A;  . ORIF ANKLE FRACTURE Right 03/24/2012   Procedure: OPEN REDUCTION INTERNAL FIXATION (ORIF) ANKLE FRACTURE;  Surgeon: Newt Minion, MD;  Location: [place];  Service: Orthopedics;  Laterality: Right;  Open Reduction Internal Fixation Right Bimalleolar ankle fracture  . POLYPECTOMY    . TEE WITHOUT CARDIOVERSION N/A 10/18/2016   Procedure: TRANSESOPHAGEAL ECHOCARDIOGRAM (TEE);  Surgeon: Fay Records, MD;  [facility] END[facility];  Service: Cardiovascular;  Laterality: N/A;  . TONSILLECTOMY      Assessment & Plan Clinical Impression: Sarah [name] is a 71 y.o. right handed female with history of hypertension, hyperlipidemia, diabetes mellitus with peripheral neuropathy, and tobacco abuse.  Presented 10/15/2016 with left-sided weakness and facial droop. Per chart review and patient, patient lives with elderly mother. Independent prior to admission. 2 level home. Her sister from [place] to provide extended assistance as needed. Cranial CT reviewed, showing right frontal infarct. Per report, focal white matter hypodensity in the posterior right frontal deep white matter new since 03/04/2007. MRI/MRA showed no acute hemorrhage or mass effect. Multifocal acute ischemia within the right MCA distribution with the largest area measuring 1.5 cm. Distal right middle cerebral artery M1 segment occlusion. Patient did not receive TPA. Echocardiogram with ejection fraction of 47% grade 2 diastolic dysfunction. Carotid Dopplers with 40-59% right ICA stenosis. CT angiogram of the neck showed no emboli source identified. Neurology consulted and  maintained on aspirin and Plavix for CVA prophylaxis. TEE showed no PFO without masses and a loop recorder was placed. Follow up MRI of the brain pending. Placed on empiric Augmentin 10/17/2016 for suspected dental abscess. Physical and occupational  therapy evaluations completed with recommendations of physical medicine rehabilitation consult and patient was admitted for a comprehensive rehabilitation program 10/19/16. Patient transferred to CIR on 10/19/2016.   Patient currently requires up to mod with mobility secondary to muscle weakness, decreased cardiorespiratoy endurance, impaired timing and sequencing, unbalanced muscle activation, decreased coordination and decreased motor planning, decreased attention and decreased safety awareness and decreased sitting balance, decreased standing balance, hemiplegia and decreased balance strategies.  Prior to hospitalization, patient was independent  with mobility and lived with Family (Mother (30 years old)) in a House home.  Home access is 2Stairs to enter.  Patient will benefit from skilled PT intervention to maximize safe functional mobility and minimize fall risk for planned discharge home with 24 hour supervision.  Anticipate patient will benefit from follow up [place] at discharge.  PT - End of Session Activity Tolerance: Tolerates 10 - 20 min activity with multiple rests Endurance Deficit: Yes PT Assessment Rehab Potential (ACUTE/IP ONLY): Excellent PT Patient demonstrates impairments in the following area(s): Balance;Behavior;Endurance;Motor;Perception;Safety PT Transfers Functional Problem(s): Bed to Chair;Car PT Locomotion Functional Problem(s): Ambulation;Stairs PT Plan PT Intensity: Minimum of 1-2 x/day ,45 to 90 minutes PT Frequency: 5 out of 7 days PT Duration Estimated Length of Stay: 7-10 days PT Treatment/Interventions: Ambulation/gait training;Balance/vestibular training;Cognitive remediation/compensation;Discharge planning;DME/adaptive equipment instruction;Functional mobility training;Neuromuscular re-education;Patient/family education;Stair training;Splinting/orthotics;Therapeutic Activities;Therapeutic Exercise;UE/LE Strength taining/ROM;UE/LE Coordination activities PT Transfers  Anticipated Outcome(s): supervision PT Locomotion Anticipated Outcome(s): supervision with rollator PT Recommendation Follow Up Recommendations: Home health PT Patient destination: Home Equipment Recommended: To be determined  Skilled Therapeutic Intervention Pt has no c/o pain throughout session. Pt performed bed mobility rolling to R and supine > sit with supervision. Pt performed sit > stand min A for guarding and steadying. Pt performed 3/3 toileting with supervision. Pt performed hand hygiene standing at sink with min guard. Pt amb 75' without AD to small gym with min A and decreased gait speed, step to gait pattern, and slight circumduction of L hip. Pt performed car transfer with min A for sequencing and technique as well as guarding. PT assessed strength and sensation in LEs. Pt performed amb up ramp and across mulch, down curb, up curb, back across mulch, down ramp with min A and railing on R. Pt amb with rollator 100' to gym with min A and increased gait speed and using step through gait pattern. Pt ascended and descended 4 steps with mod A and R railing for balance and assistance with sequencing and recovering 1 LOB. Pt performed 5 x STS in 15.26 sec. Pt amb 100' back to pt room with min A and rollator. Pt returned to w/c, call bell within reach, all needs addressed, waiting for OT.   PT Evaluation Precautions/Restrictions Precautions Precautions: Fall Restrictions Weight Bearing Restrictions: No Pain Pain Assessment Pain Assessment: No/denies pain Home Living/Prior Functioning Home Living Available Help at Discharge: Family;Available 24 hours/day Type of Home: House Home Access: Stairs to enter [employer] of Steps: 2 Home Layout: Two level Alternate Level Stairs-Number of Steps: Flight Alternate Level Stairs-Rails: Right;Left;Can reach both (B railings part of the way up and then only on R)  Lives With: Family (Mother (48 years old)) Prior Function Level of  Independence: Independent with basic ADLs;Independent with homemaking with ambulation;Independent with gait;Independent with  transfers  Able to Take Stairs?: Yes Driving: Yes Vocation: Retired Leisure: Hobbies-yes (Comment) Comments: Bowling, taking care of mother/things around the house Vision/Perception  Vision - Assessment Additional Comments: Pt wears glasses for reading and driving at night.   Cognition Overall Cognitive Status: Impaired/Different from baseline Arousal/Alertness: Lethargic (Awake but very tired and dozed off during breaks throughout therapy session) Orientation Level: Oriented X4 Attention: Sustained Sustained Attention: Impaired Sustained Attention Impairment: Verbal complex;Functional complex Selective Attention: Impaired Selective Attention Impairment: Verbal basic Memory: Appears intact Awareness: Impaired Awareness Impairment: Emergent impairment;Anticipatory impairment Problem Solving: Impaired Problem Solving Impairment: Verbal complex;Functional complex Executive Function: Reasoning Reasoning: Impaired Reasoning Impairment: Verbal complex;Functional complex Safety/Judgment: Impaired Sensation Sensation Light Touch: Appears Intact (LEs) Coordination Gross Motor Movements are Fluid and Coordinated: No Coordination and Movement Description: L hemiplegia Motor  Motor Motor: Hemiplegia  Mobility Bed Mobility Bed Mobility: Rolling Right;Supine to Sit Rolling Right: 5: Supervision Supine to Sit: 5: Supervision Supine to Sit Details: Verbal cues for sequencing Transfers Transfers: Yes Sit to Stand: 4: Min assist Sit to Stand Details: Verbal cues for precautions/safety;Verbal cues for technique Stand Pivot Transfers: 4: Min assist Stand Pivot Transfer Details: Verbal cues for technique;Verbal cues for precautions/safety;Verbal cues for sequencing Locomotion  Ambulation Ambulation: Yes Ambulation/Gait Assistance: 4: Min guard Ambulation Distance  (Feet): 50 Feet Assistive device: None Ambulation/Gait Assistance Details: Verbal cues for precautions/safety Ambulation/Gait Assistance Details: Pt amb room to small gym 50' without AD with min A and very decreased gait speed. Pt amb from small gym to main gym with rollator 75' with min A and more normal gait pattern.  Gait Gait: Yes Gait Pattern: Impaired Gait Pattern: Step-to pattern;Decreased step length - left;Left circumduction;Shuffle;Narrow base of support Gait velocity: decreased Stairs / Additional Locomotion Stairs: Yes Stairs Assistance: 3: Mod assist Stairs Assistance Details: Verbal cues for sequencing;Verbal cues for precautions/safety;Verbal cues for technique;Verbal cues for gait pattern Stair Management Technique: One rail Right Number of Stairs: 4 Ramp: 4: Min assist Curb: 4: Min Administrator Mobility: No  Trunk/Postural Assessment  Cervical Assessment Cervical Assessment: Within Functional Limits Thoracic Assessment Thoracic Assessment: Within Functional Limits Lumbar Assessment Lumbar Assessment: Within Functional Limits Postural Control Postural Control: Within Functional Limits  Balance Balance Balance Assessed: Yes Static Sitting Balance Static Sitting - Balance Support: Right upper extremity supported Static Sitting - Level of Assistance: 5: Stand by assistance Static Standing Balance Static Standing - Balance Support: Bilateral upper extremity supported Static Standing - Level of Assistance: 4: Min assist Extremity Assessment      RLE Assessment RLE Assessment: Within Functional Limits (4+/5 ) LLE Assessment LLE Assessment: Exceptions to [facility] LLE Strength Left Hip Flexion: 3+/5 Left Hip ABduction: 4-/5 Left Hip ADduction: 4-/5 Left Knee Flexion: 3+/5 Left Knee Extension: 3+/5 Left Ankle Dorsiflexion: 4/5 Left Ankle Plantar Flexion: 4/5   See Function Navigator for Current Functional Status.   Refer to Care Plan  for Long Term Goals  Recommendations for other services: None   Discharge Criteria: Patient will be discharged from PT if patient refuses treatment 3 consecutive times without medical reason, if treatment goals not met, if there is a change in medical status, if patient makes no progress towards goals or if patient is discharged from hospital.  The above assessment, treatment plan, treatment alternatives and goals were discussed and mutually agreed upon: by patient  [name] 10/20/2016, 10:48 AM

## 2016-10-20 NOTE — Evaluation (Signed)
Occupational Therapy Assessment and Plan  Patient Details  Name: Sarah [name] MRN: [id number] Date of Birth: March 02, 1945  OT Diagnosis: hemiplegia affecting non-dominant side Rehab Potential: Rehab Potential (ACUTE ONLY): Good ELOS: ~7-10 days   Today's Date: 10/20/2016 OT Individual Time:  -        Problem List:  Patient Active Problem List   Diagnosis Date Noted  . Right middle cerebral artery stroke ([place]) 10/19/2016  . Left arm weakness   . Diastolic dysfunction   . Benign essential HTN   . Tobacco abuse   . Acute blood loss anemia   . Cerebral infarction due to embolism of right middle cerebral artery ([place])   . Stroke (cerebrum) ([place]) 10/15/2016  . Perirectal abscess 09/26/2015  . Vitamin D deficiency 07/23/2015  . Osteopenia 02/16/2013  . Ankle fracture 01/19/2013  . Anemia 09/27/2011  . Uncontrolled type 2 diabetes mellitus with insulin therapy ([place]) 09/24/2011  . Hyperlipidemia 09/24/2011  . Hypertension 09/24/2011    Past Medical History:  Past Medical History:  Diagnosis Date  . Boils   . Glaucoma   . Heart murmur   . HTN (hypertension)   . Hx of adenomatous colonic polyps   . Hyperlipidemia   . Osteoporosis   . Pneumonia   . Type II or unspecified type diabetes mellitus without mention of complication, uncontrolled    Past Surgical History:  Past Surgical History:  Procedure Laterality Date  . ABDOMINAL HYSTERECTOMY    . CARPAL TUNNEL RELEASE     right  . COLONOSCOPY  12-10-10   per Dr. Deatra Ina, clear, repeat in 7 yrs   . INCISION AND DRAINAGE PERIRECTAL ABSCESS N/A 09/26/2015   Procedure: IRRIGATION AND DEBRIDEMENT PERIRECTAL ABSCESS;  Surgeon: Mickeal Skinner, MD;  Location: [place];  Service: General;  Laterality: N/A;  . KNEE ARTHROSCOPY     right  . LOOP RECORDER INSERTION N/A 10/19/2016   Procedure: LOOP RECORDER INSERTION;  Surgeon: Thompson Grayer, MD;  Location: [place] [facility];  Service: Cardiovascular;  Laterality: N/A;  . ORIF  ANKLE FRACTURE Right 03/24/2012   Procedure: OPEN REDUCTION INTERNAL FIXATION (ORIF) ANKLE FRACTURE;  Surgeon: Newt Minion, MD;  Location: [place];  Service: Orthopedics;  Laterality: Right;  Open Reduction Internal Fixation Right Bimalleolar ankle fracture  . POLYPECTOMY    . TEE WITHOUT CARDIOVERSION N/A 10/18/2016   Procedure: TRANSESOPHAGEAL ECHOCARDIOGRAM (TEE);  Surgeon: Fay Records, MD;  Location: [facility] ENDOSCOPY;  Service: Cardiovascular;  Laterality: N/A;  . TONSILLECTOMY      Assessment & Plan Clinical Impression: Patient is a 71 y.o. year old female right handed femalewith history of hypertension, hyperlipidemia, diabetes mellitus and peripheral neuropathy and tobacco abuse. Presented 10/15/2016 with left-sided weakness and facial droop. Per chart review and patient, patient lives with elderly mother. Independent prior to admission. 2 level home.Her sister from [place] to provide extended assistance as needed.Cranial CT reviewed, showing right frontal infarct. Per report, focal white matter hypodensity in the posterior right frontal deep white matter new since 03/04/2007. MRI/MRAshowed no acute hemorrhage or mass effect. Multifocal acute ischemia within the right MCA distribution with the largest area measuring 1.5 cm. Distal right middle cerebral artery M1 segment occlusion.Patient did not receive TPA. Echocardiogram with ejection fraction of 00% grade 2 diastolic dysfunction. Carotid Dopplers with 40-59% right ICA stenosis. CT angiogram of the neck showed no emboli source identified. Neurology consulted andmaintained on aspirin and Plavix for CVA prophylaxis. TEE showed no PFO without masses and a  loop recorder was placed. Follow up MRI of the brain pending.Placed on empiric Augmentin 10/07/2018for suspected dental abscess. .  Patient transferred to CIR on 10/19/2016 .    Patient currently requires min with basic self-care skills and functional mobility  secondary to muscle  weakness, decreased cardiorespiratoy endurance, impaired timing and sequencing, unbalanced muscle activation and decreased coordination, decreased awareness and decreased standing balance, decreased postural control, hemiplegia and decreased balance strategies.  Prior to hospitalization, patient could complete ADL with independent .  Patient will benefit from skilled intervention to decrease level of assist with basic self-care skills and increase independence with basic self-care skills prior to discharge home with care partner.  Anticipate patient will require intermittent supervision and follow up outpatient.  OT - End of Session Activity Tolerance: Tolerates 30+ min activity with multiple rests Endurance Deficit: Yes OT Assessment Rehab Potential (ACUTE ONLY): Good OT Patient demonstrates impairments in the following area(s): Balance;Safety;Sensory;Cognition;Endurance;Motor;Perception OT Basic ADL's Functional Problem(s): Grooming;Bathing;Dressing;Toileting OT Advanced ADL's Functional Problem(s): Simple Meal Preparation OT Transfers Functional Problem(s): Toilet;Tub/Shower OT Plan OT Intensity: Minimum of 1-2 x/day, 45 to 90 minutes OT Frequency: 5 out of 7 days OT Duration/Estimated Length of Stay: ~7-10 days OT Treatment/Interventions: Balance/vestibular training;Discharge planning;Functional electrical stimulation;Pain management;Self Care/advanced ADL retraining;Therapeutic Activities;Therapeutic Exercise;Patient/family education;Functional mobility training;Disease mangement/prevention;Cognitive remediation/compensation;Community reintegration;DME/adaptive equipment instruction;Neuromuscular re-education;Psychosocial support;UE/LE Strength taining/ROM OT Self Feeding Anticipated Outcome(s): mod I  OT Basic Self-Care Anticipated Outcome(s): mod I  OT Toileting Anticipated Outcome(s): mod I  OT Bathroom Transfers Anticipated Outcome(s): mod I OT Recommendation Recommendations for Other  Services: Neuropsych consult Patient destination: Home Follow Up Recommendations: Outpatient OT Equipment Recommended: To be determined   Skilled Therapeutic Intervention OT eval initiated with OT goals, purpose and role discussed. Self care retraining at shower level with focus on functional ambulation around the room without AD with min A, sit to stand from different surfaces, functional use of left UE as gross assist with encouragement. PT with increased proximal strength v distal strength and control. Pt able to ambulate to gym ~200 feet without AD with min A with VC for attention and coordination of left UE. Focus on NMR of left Ue- able to ilicit a weak grasp of pink foam with functional reaching activities. Returned to room with min A and left in w/c to rest in prep for lunch.   OT Evaluation Precautions/Restrictions  Precautions Precautions: Fall Restrictions Weight Bearing Restrictions: No General Chart Reviewed: Yes Family/Caregiver Present: No    Pain Pain Assessment Pain Assessment: No/denies pain Home Living/Prior Functioning Home Living Family/patient expects to be discharged to:: Private residence Living Arrangements: Parent Available Help at Discharge: Family, Available 24 hours/day Type of Home: House Home Access: Stairs to enter [employer] of Steps: 2 Home Layout: Two level Alternate Level Stairs-Number of Steps: Flight Alternate Level Stairs-Rails: Right, Left, Can reach both  Lives With: Family Prior Function Level of Independence: Independent with basic ADLs, Independent with homemaking with ambulation, Independent with gait, Independent with transfers  Able to Take Stairs?: Yes Driving: Yes Vocation: Retired Leisure: Hobbies-yes (Comment) Comments: Bowling, taking care of mother/things around the house ADL ADL ADL Comments: see functional navigator Vision Baseline Vision/History: Wears glasses;Cataracts;Glaucoma Wears Glasses: Reading  only Patient Visual Report: No change from baseline Additional Comments: glasses for reading and driving Perception  Perception: Impaired Inattention/Neglect: Does not attend to left side of body Praxis Praxis: Intact Cognition Overall Cognitive Status: Impaired/Different from baseline Arousal/Alertness: (P) Awake/alert Orientation Level: (P) Place;Situation;Person Person: (P) Oriented Place: (P) Oriented Situation: (  P) Oriented Year: (P) 2018 Month: (P) October Day of Week: (P) Correct Memory: (P) Appears intact Immediate Memory Recall: (P) Sock;Blue;Bed Memory Recall: (P) Sock;Blue;Bed Memory Recall Sock: (P) Without Cue Memory Recall Blue: (P) Without Cue Memory Recall Bed: (P) With Cue Attention: (P) Sustained Sustained Attention: Impaired Sustained Attention Impairment: Verbal complex;Functional complex Selective Attention: Impaired Selective Attention Impairment: Verbal basic Awareness: Impaired Awareness Impairment: Emergent impairment;Anticipatory impairment Problem Solving: Impaired Problem Solving Impairment: Verbal complex;Functional complex Executive Function: Reasoning Reasoning: Impaired Reasoning Impairment: Verbal complex;Functional complex Safety/Judgment: Impaired Sensation Sensation Light Touch: Impaired Detail Light Touch Impaired Details: Impaired LUE Hot/Cold: Appears Intact Proprioception: Impaired Detail Proprioception Impaired Details: Impaired LUE Coordination Gross Motor Movements are Fluid and Coordinated: No Fine Motor Movements are Fluid and Coordinated: No Coordination and Movement Description: L hemiplegia; decr distal coordination in left hand 9 Hole Peg Test: unable today Motor  Motor Motor: Hemiplegia Motor - Skilled Clinical Observations: generalized weakness Mobility  Bed Mobility Bed Mobility: Rolling Right;Supine to Sit Rolling Right: 5: Supervision Supine to Sit: 5: Supervision Supine to Sit Details: Verbal cues for  sequencing Transfers Transfers: Sit to Stand;Stand to Sit Sit to Stand: 4: Min assist Sit to Stand Details: Verbal cues for precautions/safety;Verbal cues for technique Stand to Sit: 4: Min assist  Trunk/Postural Assessment  Cervical Assessment Cervical Assessment: Within Functional Limits Thoracic Assessment Thoracic Assessment: Within Functional Limits Lumbar Assessment Lumbar Assessment: Within Functional Limits (posterior pelvic tilt) Postural Control Postural Control: Deficits on evaluation Righting Reactions: delayed and requires A to regain balance  Balance Balance Balance Assessed: Yes Static Sitting Balance Static Sitting - Balance Support: Right upper extremity supported Static Sitting - Level of Assistance: 5: Stand by assistance Static Standing Balance Static Standing - Balance Support: Bilateral upper extremity supported Static Standing - Level of Assistance: 4: Min assist Dynamic Standing Balance Dynamic Standing - Balance Support: During functional activity Dynamic Standing - Level of Assistance: 4: Min assist Extremity/Trunk Assessment RUE Assessment RUE Assessment: Within Functional Limits LUE Assessment LUE Assessment: Exceptions to WFL LUE AROM (degrees) Overall AROM Left Upper Extremity: Within functional limits for tasks assessed LUE Strength LUE Overall Strength Comments: Brunstrom VI - hand Brunstrom III LUE Tone LUE Tone: Within Functional Limits   See Function Navigator for Current Functional Status.   Refer to Care Plan for Long Term Goals  Recommendations for other services: Neuropsych   Discharge Criteria: Patient will be discharged from OT if patient refuses treatment 3 consecutive times without medical reason, if treatment goals not met, if there is a change in medical status, if patient makes no progress towards goals or if patient is discharged from hospital.  The above assessment, treatment plan, treatment alternatives and goals were  discussed and mutually agreed upon: by patient  Nicoletta Ba 10/20/2016, 11:30 AM

## 2016-10-20 NOTE — H&P (Signed)
Physical Medicine and Rehabilitation Admission H&P       Chief Complaint  Patient presents with  . Numbness  : HPI: Sarah Bozich Hendersonis a 71 y.o.right handed femalewith history of hypertension, hyperlipidemia, diabetes mellitus and peripheral neuropathy and tobacco abuse. Presented 10/15/2016 with left-sided weakness and facial droop. Per chart review and patient, patient lives with elderly mother. Independent prior to admission. 2 level home.Her sister from [place] to provide extended assistance as needed. Cranial CT reviewed, showing right frontal infarct. Per report, focal white matter hypodensity in the posterior right frontal deep white matter new since 03/04/2007. MRI/MRA showed no acute hemorrhage or mass effect. Multifocal acute ischemia within the right MCA distribution with the largest area measuring 1.5 cm. Distal right middle cerebral artery M1 segment occlusion.Patient did not receive TPA. Echocardiogram with ejection fraction of 41% grade 2 diastolic dysfunction. Carotid Dopplers with 40-59% right ICA stenosis. CT angiogram of the neck showed no emboli source identified. Neurology consulted and maintained on aspirin and Plavix for CVA prophylaxis. TEE showed no PFO without masses and a loop recorder was placed. Follow up MRI of the brain pending.Placed on empiric Augmentin 10/17/2016 for suspected dental abscess. Marland Kitchen Physical and occupational therapy evaluations completed with recommendations of physical medicine rehabilitation consult.patient was admitted for a comprehensive rehabilitation program  Review of Systems  Constitutional: Negative for chills and fever.  HENT: Negative for hearing loss.   Eyes: Negative for blurred vision and double vision.  Respiratory: Negative for cough and shortness of breath.   Cardiovascular: Positive for leg swelling. Negative for chest pain and palpitations.  Gastrointestinal: Positive for constipation. Negative for nausea and  vomiting.  Genitourinary: Negative for dysuria and hematuria.  Musculoskeletal: Positive for myalgias.  Skin: Negative for rash.  Neurological: Positive for sensory change and focal weakness.  All other systems reviewed and are negative.      Past Medical History:  Diagnosis Date  . Boils   . Glaucoma   . Heart murmur   . HTN (hypertension)   . Hx of adenomatous colonic polyps   . Hyperlipidemia   . Osteoporosis   . Pneumonia   . Type II or unspecified type diabetes mellitus without mention of complication, uncontrolled         Past Surgical History:  Procedure Laterality Date  . ABDOMINAL HYSTERECTOMY    . CARPAL TUNNEL RELEASE     right  . COLONOSCOPY  12-10-10   per Dr. Deatra Ina, clear, repeat in 7 yrs   . INCISION AND DRAINAGE PERIRECTAL ABSCESS N/A 09/26/2015   Procedure: IRRIGATION AND DEBRIDEMENT PERIRECTAL ABSCESS;  Surgeon: Mickeal Skinner, MD;  Location: [facility];  Service: General;  Laterality: N/A;  . KNEE ARTHROSCOPY     right  . ORIF ANKLE FRACTURE Right 03/24/2012   Procedure: OPEN REDUCTION INTERNAL FIXATION (ORIF) ANKLE FRACTURE;  Surgeon: Newt Minion, MD;  Location: [facility];  Service: Orthopedics;  Laterality: Right;  Open Reduction Internal Fixation Right Bimalleolar ankle fracture  . POLYPECTOMY    . TONSILLECTOMY          Family History  Problem Relation Age of Onset  . Diabetes Mother   . Hypertension Mother   . Stroke Father   . Colon cancer Neg Hx   . Esophageal cancer Neg Hx   . Rectal cancer Neg Hx   . Stomach cancer Neg Hx    Social History:  reports that she has been smoking.  She has never used smokeless tobacco.  She reports that she drinks alcohol. She reports that she does not use drugs. Allergies: No Known Allergies       Medications Prior to Admission  Medication Sig Dispense Refill  . albuterol (PROVENTIL HFA;VENTOLIN HFA) 108 (90 Base) MCG/ACT inhaler Inhale 2 puffs into the lungs every 6 (six)  hours as needed for wheezing or shortness of breath. 1 Inhaler 0  . amLODipine (NORVASC) 10 MG tablet Take 1 tablet (10 mg total) by mouth daily. 90 tablet 2  . aspirin EC 81 MG tablet Take 81 mg by mouth daily.    Marland Kitchen atorvastatin (LIPITOR) 20 MG tablet Take 1 tablet (20 mg total) by mouth daily. 90 tablet 1  . Insulin Glargine (LANTUS) 100 UNIT/ML Solostar Pen Use 60 Units daily at the same time. May titrate up if FG >200. 15 mL 3  . Insulin Lispro (HUMALOG KWIKPEN) 200 UNIT/ML SOPN Inject 10 Units into the skin 3 (three) times daily with meals. 15 mL 1  . metFORMIN (GLUCOPHAGE) 1000 MG tablet Take 1 tablet (1,000 mg total) by mouth 2 (two) times daily with a meal. 180 tablet 1  . saccharomyces boulardii (FLORASTOR) 250 MG capsule Take 1 capsule (250 mg total) by mouth 2 (two) times daily. You can get a probiotic over the counter. 60 capsule 0  . TRAVATAN Z 0.004 % SOLN ophthalmic solution Place 1 drop into both eyes at bedtime.  11  . valsartan-hydrochlorothiazide (DIOVAN-HCT) 320-25 MG tablet Take 1 tablet by mouth daily. 90 tablet 2  . Vitamin D, Ergocalciferol, (DRISDOL) 50000 units CAPS capsule TAKE ONE CAPSULE BY MOUTH TWICE A WEEK ON MONDAY AND THURSDAY (Patient taking differently: TAKE 60737 CAPSULE BY MOUTH TWICE A WEEK ON Tuesday AND THURSDAY) 8 capsule 1  . Insulin Pen Needle 32G X 4 MM MISC Use up to 4x a day (Patient not taking: Reported on 10/15/2016) 200 each 11  . insulin regular human CONCENTRATED (HUMULIN R U-500 KWIKPEN) 500 UNIT/ML kwikpen Inject 70-85 Units into the skin 3 (three) times daily with meals. (Patient not taking: Reported on 10/15/2016) 4 pen 0  . Zoster Vac Recomb Adjuvanted [facility]) injection Inject 0.5 mLs into the muscle every 3 (three) months. (Patient not taking: Reported on 10/15/2016) 1 each 1    Drug Regimen Review Drug regimen was reviewed remains appropriately with no significant issues identified  Home: Home Living Family/patient expects to be  discharged to:: Private residence Living Arrangements: Parent Available Help at Discharge: Family, Available 24 hours/day Type of Home: House Home Access: Stairs to enter Technical brewer of Steps: 1 Home Layout: Two level Alternate Level Stairs-Number of Steps: flight Alternate Level Stairs-Rails: Right, Left, Can reach both Bathroom Shower/Tub: Tub/shower unit Home Equipment: Environmental consultant - 2 wheels, Cane - single point, Other (comment)   Functional History: Prior Function Level of Independence: Independent  Functional Status:  Mobility: Bed Mobility Overal bed mobility: Needs Assistance Bed Mobility: Supine to Sit, Sit to Supine Supine to sit: Supervision Sit to supine: Supervision General bed mobility comments: cues for to use L UE to assist with transitional movements Transfers Overall transfer level: Needs assistance Equipment used: None, Straight cane Transfers: Sit to/from Stand Sit to Stand: Min guard General transfer comment: cues to use L UE to assist in pushing up into standing; SPC upon standing for balance Ambulation/Gait Ambulation/Gait assistance: Min assist, Mod assist Ambulation Distance (Feet): 100 Feet Assistive device: 1 person hand held assist, Straight cane (rail in hallway) Gait Pattern/deviations: Step-to pattern, Step-through pattern, Decreased step  length - left, Decreased stride length, Narrow base of support, Decreased dorsiflexion - left General Gait Details: several losses of balance requiring assist to recover; gait training begin with SPC and min A with max cues for 3 point gait pattern; pt demonstrated difficutly with motor planning and at times leaving L LE behind if not given cues to follow through with step; rail in hallway used as well for balance and pt given multimodal cues for improved L knee flexion during swing phase, advancement of L LE, and L heel strike; pt tends to demonstrate circumduction of L LE when using SPC  Gait velocity:  decreased Gait velocity interpretation: Below normal speed for age/gender  ADL: ADL Overall ADL's : Needs assistance/impaired Eating/Feeding: Set up, Sitting Grooming: Minimal assistance, Standing Grooming Details (indicate cue type and reason): assist for activities involving LUE Upper Body Bathing: Minimal assistance, Sitting Lower Body Bathing: Sit to/from stand, Moderate assistance Upper Body Dressing : Minimal assistance, Sitting Lower Body Dressing: Sit to/from stand, Moderate assistance Toilet Transfer: Minimal assistance, Ambulation Toilet Transfer Details (indicate cue type and reason): HHA at times Toileting- Clothing Manipulation and Hygiene: Minimal assistance, Sit to/from stand Tub/ Shower Transfer: Moderate assistance, Tub transfer, Ambulation, 3 in 1 Functional mobility during ADLs: Minimal assistance (+1 HHA) General ADL Comments: Pt completed bed mobility, toilet transfer, grooming task at sink.  Cognition: Cognition Overall Cognitive Status: Impaired/Different from baseline Arousal/Alertness: Lethargic Orientation Level: Oriented X4 Attention: Selective Sustained Attention: Impaired Sustained Attention Impairment: Verbal complex Selective Attention: Impaired Selective Attention Impairment: Verbal basic, Verbal complex Memory: Appears intact Awareness: Impaired Awareness Impairment: Intellectual impairment Problem Solving: Impaired Problem Solving Impairment: Verbal complex Executive Function: Reasoning Reasoning: Impaired Reasoning Impairment: Verbal complex Safety/Judgment: Impaired Cognition Arousal/Alertness: Awake/alert Behavior During Therapy: WFL for tasks assessed/performed Overall Cognitive Status: Impaired/Different from baseline Area of Impairment: Attention, Safety/judgement, Problem solving Current Attention Level: Selective Safety/Judgement: Decreased awareness of safety, Decreased awareness of deficits Problem Solving: Slow processing,  Difficulty sequencing, Requires verbal cues  Physical Exam: Blood pressure (!) 127/57, pulse 75, temperature 98.9 F (37.2 C), temperature source Oral, resp. rate 18, height 5' (1.524 m), weight 63.1 kg (139 lb 3.2 oz), SpO2 98 %. Physical Exam  Vitals reviewed. Constitutional: She is oriented to person, place, and time. She appears well-developed.  HENT:  Head: Normocephalic.  Eyes: EOM are normal.  Neck: Normal range of motion. Neck supple. No thyromegaly present.  Cardiovascular: Normal rate, regular rhythm and normal heart sounds.  Exam reveals no friction rub.   No murmur heard. Respiratory: Effort normal and breath sounds normal. No respiratory distress.  GI: Soft. Bowel sounds are normal. She exhibits no distension.  Neurological: She is alert and oriented to person, place, and time.  Follows commands. CN exam left central 7, mild dysarthria Fair awareness of deficits. Mtoor: RUE 5/5 prox to distal,  RLE: 5/5 proximal to distal LUE: deltoid, biceps, triceps 4-/5, wrist and HI 2/5. LLE: 2+ to 3-/5 HF, KE and 4/5 ADF/PF Sensation diminished to light touch distal LUE DTRs symmetric 1+, Toes down.  Psych: affect is pleasant, pt cooperative  Lab Results Last 48 Hours       Results for orders placed or performed during the hospital encounter of 10/15/16 (from the past 48 hour(s))  Glucose, capillary     Status: Abnormal   Collection Time: 10/16/16  4:02 PM  Result Value Ref Range   Glucose-Capillary 193 (H) 65 - 99 mg/dL   Comment 1 Notify RN   Glucose, capillary  Status: None   Collection Time: 10/16/16  8:56 PM  Result Value Ref Range   Glucose-Capillary 81 65 - 99 mg/dL   Comment 1 Notify RN    Comment 2 Document in Chart   Glucose, capillary     Status: None   Collection Time: 10/17/16  6:35 AM  Result Value Ref Range   Glucose-Capillary 91 65 - 99 mg/dL  Glucose, capillary     Status: Abnormal   Collection Time: 10/17/16 11:34 AM  Result Value Ref  Range   Glucose-Capillary 125 (H) 65 - 99 mg/dL   Comment 1 Notify RN    Comment 2 Document in Chart   Glucose, capillary     Status: Abnormal   Collection Time: 10/17/16  4:49 PM  Result Value Ref Range   Glucose-Capillary 63 (L) 65 - 99 mg/dL   Comment 1 Notify RN    Comment 2 Document in Chart   Glucose, capillary     Status: Abnormal   Collection Time: 10/17/16  9:36 PM  Result Value Ref Range   Glucose-Capillary 209 (H) 65 - 99 mg/dL   Comment 1 Notify RN    Comment 2 Document in Chart   Glucose, capillary     Status: Abnormal   Collection Time: 10/18/16  6:28 AM  Result Value Ref Range   Glucose-Capillary 131 (H) 65 - 99 mg/dL   Comment 1 Notify RN    Comment 2 Document in Chart      Imaging Results (Last 48 hours)  No results found.       Medical Problem List and Plan: 1.  Left-sided weakness secondary to right MCA infarction. Status post loop recorder             -admit to inpatient rehab 2.  DVT Prophylaxis/Anticoagulation: SCD.Monitor for any signs of DVT 3. Pain Management: Hydrocodone as needed 4. Mood: Provide emotional support 5. Neuropsych: This patient is capable of making decisions on her own behalf. 6. Skin/Wound Care: Routine skin checks 7. Fluids/Electrolytes/Nutrition: Routine I&O with follow up chemestries 8.Diabetes mellitus peripheral neuropathy.Hemoglobin A1c 12.9.Glucophage 1000 mg twice a day,NovoLog 10 units 3 times a day with meals, Lantus insulin 60 units daily. Check blood sugars before meals and at bedtime. Diabetic teaching 9.Hypertension.No current antihypertensive medication. Patient on Norvasc 10 mg daily, Diovan-HCT 320-25 milligrams daily prior to admission. Resume as needed 10. Hyperlipidemia. Lipitor 11. COPD/ Tobacco abuse. Counseling 12.Suspect dental abscess. Empiric Augmentin 500 mg 3 times a day initiated 10/17/2016   Post Admission Physician Evaluation: 1. Functional deficits secondary  to  right MCA infarct. 2. Patient is admitted to receive collaborative, interdisciplinary care between the physiatrist, rehab nursing staff, and therapy team. 3. Patient's level of medical complexity and substantial therapy needs in context of that medical necessity cannot be provided at a lesser intensity of care such as a SNF. 4. Patient has experienced substantial functional loss from his/her baseline which was documented above under the "Functional History" and "Functional Status" headings.  Judging by the patient's diagnosis, physical exam, and functional history, the patient has potential for functional progress which will result in measurable gains while on inpatient rehab.  These gains will be of substantial and practical use upon discharge  in facilitating mobility and self-care at the household level. 5. Physiatrist will provide 24 hour management of medical needs as well as oversight of the therapy plan/treatment and provide guidance as appropriate regarding the interaction of the two. 6. The Preadmission Screening has been reviewed and  patient status is unchanged unless otherwise stated above. 7. 24 hour rehab nursing will assist with bladder management, bowel management, safety, skin/wound care, disease management, medication administration, pain management and patient education  and help integrate therapy concepts, techniques,education, etc. 8. PT will assess and treat for/with: Lower extremity strength, range of motion, stamina, balance, functional mobility, safety, adaptive techniques and equipment, NMR, visual-spatial awareness, family ed.   Goals are: supervision to mod I. 9. OT will assess and treat for/with: ADL's, functional mobility, safety, upper extremity strength, adaptive techniques and equipment, NMR, family ed, visual-spatial awareness.   Goals are: supervision to mod I. Therapy may proceed with showering this patient. 10. SLP will assess and treat for/with: speech, communication,  cognition.  Goals are: mod I. 11. Case Management and Social Worker will assess and treat for psychological issues and discharge planning. 12. Team conference will be held weekly to assess progress toward goals and to determine barriers to discharge. 13. Patient will receive at least 3 hours of therapy per day at least 5 days per week. 14. ELOS: 7-10 days       15. Prognosis:  excellent     Meredith Staggers, MD, [place] 10/19/2016  Cathlyn Parsons., PA-C 10/18/2016

## 2016-10-20 NOTE — Evaluation (Signed)
Speech Language Pathology Assessment and Plan  Patient Details  Name: Sarah [name] MRN: [id number] Date of Birth: [birth year]  SLP Diagnosis: Dysarthria;Cognitive Impairments  Rehab Potential: Excellent ELOS: 7 to 10 days    Today's Date: 10/20/2016 SLP Individual Time: 9629-5284 SLP Individual Time Calculation (min): 60 min   Problem List:  Patient Active Problem List   Diagnosis Date Noted  . Right middle cerebral artery stroke ([place]) 10/19/2016  . Left arm weakness   . Diastolic dysfunction   . Benign essential HTN   . Tobacco abuse   . Acute blood loss anemia   . Cerebral infarction due to embolism of right middle cerebral artery ([place])   . Stroke (cerebrum) ([place]) 10/15/2016  . Perirectal abscess 09/26/2015  . Vitamin D deficiency 07/23/2015  . Osteopenia 02/16/2013  . Ankle fracture 01/19/2013  . Anemia 09/27/2011  . Uncontrolled type 2 diabetes mellitus with insulin therapy ([place]) 09/24/2011  . Hyperlipidemia 09/24/2011  . Hypertension 09/24/2011   Past Medical History:  Past Medical History:  Diagnosis Date  . Boils   . Glaucoma   . Heart murmur   . HTN (hypertension)   . Hx of adenomatous colonic polyps   . Hyperlipidemia   . Osteoporosis   . Pneumonia   . Type II or unspecified type diabetes mellitus without mention of complication, uncontrolled    Past Surgical History:  Past Surgical History:  Procedure Laterality Date  . ABDOMINAL HYSTERECTOMY    . CARPAL TUNNEL RELEASE     right  . COLONOSCOPY  12-10-10   per Dr. Deatra [name], clear, repeat in 7 yrs   . INCISION AND DRAINAGE PERIRECTAL ABSCESS N/A 09/26/2015   Procedure: IRRIGATION AND DEBRIDEMENT PERIRECTAL ABSCESS;  Surgeon: Sarah Skinner, MD;  Location: [facility];  Service: General;  Laterality: N/A;  . KNEE ARTHROSCOPY     right  . LOOP RECORDER INSERTION N/A 10/19/2016   Procedure: LOOP RECORDER INSERTION;  Surgeon: Sarah Grayer, MD;  Location: [facility];  Service:  Cardiovascular;  Laterality: N/A;  . ORIF ANKLE FRACTURE Right 03/24/2012   Procedure: OPEN REDUCTION INTERNAL FIXATION (ORIF) ANKLE FRACTURE;  Surgeon: Sarah Minion, MD;  Location: [facility];  Service: Orthopedics;  Laterality: Right;  Open Reduction Internal Fixation Right Bimalleolar ankle fracture  . POLYPECTOMY    . TEE WITHOUT CARDIOVERSION N/A 10/18/2016   Procedure: TRANSESOPHAGEAL ECHOCARDIOGRAM (TEE);  Surgeon: Sarah Records, MD;  Location: [facility];  Service: Cardiovascular;  Laterality: N/A;  . TONSILLECTOMY      Assessment / Plan / Recommendation Clinical Impression Sarah [name] a 71 y.o.right handed femalewith history of hypertension, hyperlipidemia, diabetes mellitus and peripheral neuropathy and tobacco abuse. Presented 10/15/2016 with left-sided weakness and facial droop. Per chart review and patient, patient lives with elderly mother. Independent prior to admission. 2 level home.Her sister from [place] to provide extended assistance as needed.Cranial CT reviewed, showing right frontal infarct. Per report, focal white matter hypodensity in the posterior right frontal deep white matter new since 03/04/2007. MRI/MRAshowed no acute hemorrhage or mass effect. Multifocal acute ischemia within the right MCA distribution with the largest area measuring 1.5 cm. Distal right middle cerebral artery M1 segment occlusion.Patient did not receive TPA. Echocardiogram with ejection fraction of 13% grade 2 diastolic dysfunction. Carotid Dopplers with 40-59% right ICA stenosis. CT angiogram of the neck showed no emboli source identified. Neurology consulted andmaintained on aspirin and Plavix for CVA prophylaxis. TEE showed no PFO without masses and a loop recorder  was placed. Follow up MRI of the brain pending.Placed on empiric Augmentin 10/07/2018for suspected dental abscess. Physical and occupational therapy evaluations completed with recommendations of physical medicine  rehabilitation consult. Patient was admitted for a comprehensive rehabilitation program on 10/19/16.   Comprehensive cognitive linguistic evaluation completed on 10/20/16. Of note, during evaluation, pt was emotional regarding CVA and mildly tired. Emotional support given. Pt presents with mild cognitive-linguistic impairments impacting complex verbal and functional problem solving, selective attention, anticipatory awareness and mildly impaired speech intelligibility at the complex conversation level within setting of mild to moderate environmental noise. Recommend pt receive skilled ST to target the above mentioned deficits. Don't anticipate that pt will require follow up ST services.    Skilled Therapeutic Interventions          Skilled ST focused on completion of cognitive linguistic evaluation, see above. Pt required Min A to supervision cues to complete complex verbal and complex functional problem solving tasks. Scope of CIR explained and all questions answered to pt satisfaction.    SLP Assessment  Patient will need skilled [place] Pathology Services during CIR admission    Recommendations  Recommendations for Other Services: Neuropsych consult Patient destination: Home Follow up Recommendations: None Equipment Recommended: None recommended by SLP    SLP Frequency 3 to 5 out of 7 days   SLP Duration  SLP Intensity  SLP Treatment/Interventions 7 to 10 days  Minumum of 1-2 x/day, 30 to 90 minutes  Cognitive remediation/compensation;Cueing hierarchy;Functional tasks;Medication managment;Patient/family education    Pain    Prior Functioning Cognitive/Linguistic Baseline: Within functional limits Type of Home: House  Lives With: Family Available Help at Discharge: Family;Available 24 hours/day Vocation: Retired  Function:   Cognition Comprehension Comprehension assist level: Follows complex conversation/direction with extra time/assistive device  Expression    Expression assist level: Expresses complex 90% of the time/cues < 10% of the time  Social Interaction Social Interaction assist level: Interacts appropriately with others - No medications needed.  Problem Solving Problem solving assist level: Solves complex 90% of the time/cues < 10% of the time;Solves complex problems: With extra time  Memory Memory assist level: More than reasonable amount of time   Short Term Goals: Week 1: SLP Short Term Goal 1 (Week 1): Pt will complete complex problem solving tasks with supervision cues.  SLP Short Term Goal 2 (Week 1): Pt will demonstrate anticipatory awareness by listing 3 activities that are safe for her to participate in within home environment with supervision cues.  SLP Short Term Goal 3 (Week 1): Pt will utilize speech intelligibility strategies at the complex conversation level in moderately noisey environment to achieve >95% intelligibility with Mod I. SLP Short Term Goal 4 (Week 1): Pt will demonstrate selective attention in mildly distracting environment for ~ 45 minutes with supervision cues.   Refer to Care Plan for Long Term Goals  Recommendations for other services: Neuropsych  Discharge Criteria: Patient will be discharged from SLP if patient refuses treatment 3 consecutive times without medical reason, if treatment goals not met, if there is a change in medical status, if patient makes no progress towards goals or if patient is discharged from hospital.  The above assessment, treatment plan, treatment alternatives and goals were discussed and mutually agreed upon: by patient  Torsha Lemus 10/20/2016, 8:40 AM

## 2016-10-20 NOTE — Care Management Note (Signed)
[facility] Individual Statement of Services  Patient Name:  Sarah [name]  Date:  10/20/2016  Welcome to the [facility].  Our goal is to provide you with an individualized program based on your diagnosis and situation, designed to meet your specific needs.  With this comprehensive rehabilitation program, you will be expected to participate in at least 3 hours of rehabilitation therapies Monday-Friday, with modified therapy programming on the weekends.  Your rehabilitation program will include the following services:  Physical Therapy (PT), Occupational Therapy (OT), Speech Therapy (ST), 24 hour per day rehabilitation nursing, Neuropsychology, Case Management (Social Worker), Rehabilitation Medicine, Nutrition Services and Pharmacy Services  Weekly team conferences will be held on Wednesday to discuss your progress.  Your Social Worker will talk with you frequently to get your input and to update you on team discussions.  Team conferences with you and your family in attendance may also be held.  Expected length of stay: 8-10 days Overall anticipated outcome: supervision level  Depending on your progress and recovery, your program may change. Your Social Worker will coordinate services and will keep you informed of any changes. Your Social Worker's name and contact numbers are listed  below.  The following services may also be recommended but are not provided by the [facility] will be made to provide these services after discharge if needed.  Arrangements include referral to agencies that provide these services.  Your insurance has been verified to be:  [employer]Medicare Your primary doctor is:  Betty [name]  Pertinent information will be shared with your doctor and your insurance company.  Social Worker:  Ovidio Kin, [place] or ([phone]  Information discussed with and copy given to patient by: Elease Hashimoto, 10/20/2016, 10:14 AM

## 2016-10-20 NOTE — Progress Notes (Signed)
Sarah Arn, MD Physician Signed Physical Medicine and Rehabilitation  Consult Note Date of Service: 10/18/2016 5:57 AM  Related encounter: ED to Hosp-Admission (Discharged) from 10/15/2016 in [facility] All Collapse All   [] Hide copied text [] Hover for attribution information      Physical Medicine and Rehabilitation Consult Reason for Consult: left-sided weakness and facial droop Referring Physician: [facility]   HPI: Sarah [name] is a 71 y.o. right handed female with history of hypertension, hyperlipidemia, diabetes mellitus and peripheral neuropathy and tobacco abuse.  Presented 10/15/2016 with left-sided weakness and facial droop. Per chart review and patient, patient lives with elderly mother. Independent prior to admission. 2 level home. Cranial CT reviewed, showing right frontal infarct.  Per report, focal white matter hypodensity in the posterior right frontal deep white matter new since 03/04/2007. MRI showed no acute hemorrhage or mass effect. Multifocal acute ischemia within the right MCA distribution with the largest area measuring 1.5 cm. Distal right middle cerebral artery M1 segment occlusion.Patient did not receive TPA. Echocardiogram with ejection fraction of 29% grade 2 diastolic dysfunction. Carotid Dopplers with 40-59% right ICA stenosis. Neurology consultant maintained on aspirin and Plavix for CVA prophylaxis. Await plan for TEE as well as loop recorder. Physical and occupational therapy evaluations completed with recommendations of physical medicine rehabilitation consult.   Review of Systems  Constitutional: Negative for chills and fever.  HENT: Negative for hearing loss and tinnitus.   Eyes: Negative for blurred vision and double vision.  Respiratory: Negative for cough and shortness of breath.   Cardiovascular: Negative for chest pain and palpitations.  Gastrointestinal: Positive for  constipation. Negative for nausea and vomiting.  Genitourinary: Negative for dysuria, flank pain and hematuria.  Skin: Negative for rash.  Neurological: Positive for sensory change, focal weakness and weakness. Negative for seizures.  All other systems reviewed and are negative.      Past Medical History:  Diagnosis Date  . Boils   . Glaucoma   . Heart murmur   . HTN (hypertension)   . Hx of adenomatous colonic polyps   . Hyperlipidemia   . Osteoporosis   . Pneumonia   . Type II or unspecified type diabetes mellitus without mention of complication, uncontrolled         Past Surgical History:  Procedure Laterality Date  . ABDOMINAL HYSTERECTOMY    . CARPAL TUNNEL RELEASE     right  . COLONOSCOPY  12-10-10   per Dr. Deatra Ina, clear, repeat in 7 yrs   . INCISION AND DRAINAGE PERIRECTAL ABSCESS N/A 09/26/2015   Procedure: IRRIGATION AND DEBRIDEMENT PERIRECTAL ABSCESS;  Surgeon: Mickeal Skinner, MD;  Location: [facility];  Service: General;  Laterality: N/A;  . KNEE ARTHROSCOPY     right  . ORIF ANKLE FRACTURE Right 03/24/2012   Procedure: OPEN REDUCTION INTERNAL FIXATION (ORIF) ANKLE FRACTURE;  Surgeon: Newt Minion, MD;  Location: [facility];  Service: Orthopedics;  Laterality: Right;  Open Reduction Internal Fixation Right Bimalleolar ankle fracture  . POLYPECTOMY    . TONSILLECTOMY          Family History  Problem Relation Age of Onset  . Diabetes Mother   . Hypertension Mother   . Stroke Father   . Colon cancer Neg Hx   . Esophageal cancer Neg Hx   . Rectal cancer Neg Hx   . Stomach cancer Neg Hx    Social History:  reports that she  has been smoking.  She has never used smokeless tobacco. She reports that she drinks alcohol. She reports that she does not use drugs. Allergies: No Known Allergies       Medications Prior to Admission  Medication Sig Dispense Refill  . albuterol (PROVENTIL HFA;VENTOLIN HFA) 108 (90 Base) MCG/ACT inhaler  Inhale 2 puffs into the lungs every 6 (six) hours as needed for wheezing or shortness of breath. 1 Inhaler 0  . amLODipine (NORVASC) 10 MG tablet Take 1 tablet (10 mg total) by mouth daily. 90 tablet 2  . aspirin EC 81 MG tablet Take 81 mg by mouth daily.    Marland Kitchen atorvastatin (LIPITOR) 20 MG tablet Take 1 tablet (20 mg total) by mouth daily. 90 tablet 1  . Insulin Glargine (LANTUS) 100 UNIT/ML Solostar Pen Use 60 Units daily at the same time. May titrate up if FG >200. 15 mL 3  . Insulin Lispro (HUMALOG KWIKPEN) 200 UNIT/ML SOPN Inject 10 Units into the skin 3 (three) times daily with meals. 15 mL 1  . metFORMIN (GLUCOPHAGE) 1000 MG tablet Take 1 tablet (1,000 mg total) by mouth 2 (two) times daily with a meal. 180 tablet 1  . saccharomyces boulardii (FLORASTOR) 250 MG capsule Take 1 capsule (250 mg total) by mouth 2 (two) times daily. You can get a probiotic over the counter. 60 capsule 0  . TRAVATAN Z 0.004 % SOLN ophthalmic solution Place 1 drop into both eyes at bedtime.  11  . valsartan-hydrochlorothiazide (DIOVAN-HCT) 320-25 MG tablet Take 1 tablet by mouth daily. 90 tablet 2  . Vitamin D, Ergocalciferol, (DRISDOL) 50000 units CAPS capsule TAKE ONE CAPSULE BY MOUTH TWICE A WEEK ON MONDAY AND THURSDAY (Patient taking differently: TAKE 40981 CAPSULE BY MOUTH TWICE A WEEK ON Tuesday AND THURSDAY) 8 capsule 1  . Insulin Pen Needle 32G X 4 MM MISC Use up to 4x a day (Patient not taking: Reported on 10/15/2016) 200 each 11  . insulin regular human CONCENTRATED (HUMULIN R U-500 KWIKPEN) 500 UNIT/ML kwikpen Inject 70-85 Units into the skin 3 (three) times daily with meals. (Patient not taking: Reported on 10/15/2016) 4 pen 0  . Zoster Vac Recomb Adjuvanted [facility]) injection Inject 0.5 mLs into the muscle every 3 (three) months. (Patient not taking: Reported on 10/15/2016) 1 each 1    Home: Home Living Family/patient expects to be discharged to:: Private residence Living Arrangements:  Parent Available Help at Discharge: Family, Available 24 hours/day Type of Home: House Home Access: Stairs to enter Technical brewer of Steps: 1 Home Layout: Two level Alternate Level Stairs-Number of Steps: flight Alternate Level Stairs-Rails: Right, Left, Can reach both Bathroom Shower/Tub: Tub/shower unit Home Equipment: Environmental consultant - 2 wheels, Cane - single point, Other (comment)  Functional History: Prior Function Level of Independence: Independent Functional Status:  Mobility: Bed Mobility Overal bed mobility: Needs Assistance Bed Mobility: Supine to Sit, Sit to Supine Supine to sit: Supervision Sit to supine: Supervision General bed mobility comments: cues for to use L UE to assist with transitional movements Transfers Overall transfer level: Needs assistance Equipment used: None, Straight cane Transfers: Sit to/from Stand Sit to Stand: Min guard General transfer comment: cues to use L UE to assist in pushing up into standing; SPC upon standing for balance Ambulation/Gait Ambulation/Gait assistance: Min assist, Mod assist Ambulation Distance (Feet): 100 Feet Assistive device: 1 person hand held assist, Straight cane (rail in hallway) Gait Pattern/deviations: Step-to pattern, Step-through pattern, Decreased step length - left, Decreased stride length, Narrow  base of support, Decreased dorsiflexion - left General Gait Details: several losses of balance requiring assist to recover; gait training begin with SPC and min A with max cues for 3 point gait pattern; pt demonstrated difficutly with motor planning and at times leaving L LE behind if not given cues to follow through with step; rail in hallway used as well for balance and pt given multimodal cues for improved L knee flexion during swing phase, advancement of L LE, and L heel strike; pt tends to demonstrate circumduction of L LE when using SPC  Gait velocity: decreased Gait velocity interpretation: Below normal speed for  age/gender  ADL: ADL Overall ADL's : Needs assistance/impaired Eating/Feeding: Set up, Sitting Grooming: Minimal assistance, Standing Grooming Details (indicate cue type and reason): assist for activities involving LUE Upper Body Bathing: Minimal assistance, Sitting Lower Body Bathing: Sit to/from stand, Moderate assistance Upper Body Dressing : Minimal assistance, Sitting Lower Body Dressing: Sit to/from stand, Moderate assistance Toilet Transfer: Minimal assistance, Ambulation Toilet Transfer Details (indicate cue type and reason): HHA at times Toileting- Clothing Manipulation and Hygiene: Minimal assistance, Sit to/from stand Tub/ Shower Transfer: Moderate assistance, Tub transfer, Ambulation, 3 in 1 Functional mobility during ADLs: Minimal assistance (+1 HHA) General ADL Comments: Pt completed bed mobility, toilet transfer, grooming task at sink.  Cognition: Cognition Overall Cognitive Status: Impaired/Different from baseline Arousal/Alertness: Lethargic Orientation Level: Oriented X4 Attention: Selective Sustained Attention: Impaired Sustained Attention Impairment: Verbal complex Selective Attention: Impaired Selective Attention Impairment: Verbal basic, Verbal complex Memory: Appears intact Awareness: Impaired Awareness Impairment: Intellectual impairment Problem Solving: Impaired Problem Solving Impairment: Verbal complex Executive Function: Reasoning Reasoning: Impaired Reasoning Impairment: Verbal complex Safety/Judgment: Impaired Cognition Arousal/Alertness: Awake/alert Behavior During Therapy: WFL for tasks assessed/performed Overall Cognitive Status: Impaired/Different from baseline Area of Impairment: Attention, Safety/judgement, Problem solving Current Attention Level: Selective Safety/Judgement: Decreased awareness of safety, Decreased awareness of deficits Problem Solving: Slow processing, Difficulty sequencing, Requires verbal cues  Blood pressure  103/60, pulse 83, temperature 98.4 F (36.9 C), temperature source Oral, resp. rate 16, height 5' (1.524 m), weight 63.1 kg (139 lb 3.2 oz), SpO2 96 %. Physical Exam  Vitals reviewed. Constitutional: She is oriented to person, place, and time. She appears well-developed and well-nourished.  HENT:  Head: Normocephalic and atraumatic.  Eyes: EOM are normal. Right eye exhibits no discharge. Left eye exhibits no discharge.  Neck: Normal range of motion. Neck supple. No thyromegaly present.  Cardiovascular: Normal rate, regular rhythm and normal heart sounds.   Respiratory: Effort normal and breath sounds normal. No respiratory distress.  GI: Soft. Bowel sounds are normal. She exhibits no distension.  Musculoskeletal: She exhibits no edema or tenderness.  Neurological: She is alert and oriented to person, place, and time.  Follows commands.  Fair awareness of deficits. Mtoor: RUE, B/l LE: 5/5 proximal to distal LUE: Shoulder abduction, elbow flex/ext 4-/5, distally 1/5 Sensation diminished to light touch distal LUE DTRs symmetric Left facial weakness  Skin: Skin is warm and dry.  Psychiatric: She has a normal mood and affect. Her behavior is normal. Thought content normal.    Lab Results Last 24 Hours       Results for orders placed or performed during the hospital encounter of 10/15/16 (from the past 24 hour(s))  Glucose, capillary     Status: None   Collection Time: 10/17/16  6:35 AM  Result Value Ref Range   Glucose-Capillary 91 65 - 99 mg/dL  Glucose, capillary     Status: Abnormal  Collection Time: 10/17/16 11:34 AM  Result Value Ref Range   Glucose-Capillary 125 (H) 65 - 99 mg/dL   Comment 1 Notify RN    Comment 2 Document in Chart   Glucose, capillary     Status: Abnormal   Collection Time: 10/17/16  4:49 PM  Result Value Ref Range   Glucose-Capillary 63 (L) 65 - 99 mg/dL   Comment 1 Notify RN    Comment 2 Document in Chart   Glucose, capillary      Status: Abnormal   Collection Time: 10/17/16  9:36 PM  Result Value Ref Range   Glucose-Capillary 209 (H) 65 - 99 mg/dL   Comment 1 Notify RN    Comment 2 Document in Chart      Imaging Results (Last 48 hours)  No results found.    Assessment/Plan: Diagnosis: Right MCA infarct Labs and images independently reviewed.  Records reviewed and summated above. Stroke: Continue secondary stroke prophylaxis and Risk Factor Modification listed below:   Antiplatelet therapy:   Blood Pressure Management:  Continue current medication with prn's with permisive HTN per primary team Statin Agent:   Diabetes management:   Tobacco abuse:   LUE hemiparesis: fit for orthosis to prevent contractures (resting hand splint for day, wrist cock up splint at night) Motor recovery: Fluoxetine  1. Does the need for close, 24 hr/day medical supervision in concert with the patient's rehab needs make it unreasonable for this patient to be served in a less intensive setting? Yes 2. Co-Morbidities requiring supervision/potential complications: diastolic dysfunction (monitor for signs/symptoms of fluid overload), HTN (monitor and provide prns in accordance with increased physical exertion and pain), hyperlipidemia (cont meds), diabetes mellitus with peripheral neuropathy (Monitor in accordance with exercise and adjust meds as necessary), tobacco abuse (counsel), hypokalemia (continue to monitor and replete as necessary), ABLA (transfuse if necessary to ensure appropriate perfusion for increased activity tolerance) 3. Due to safety, disease management and patient education, does the patient require 24 hr/day rehab nursing? Yes 4. Does the patient require coordinated care of a physician, rehab nurse, PT (1-2 hrs/day, 5 days/week) and OT (1-2 hrs/day, 5 days/week) to address physical and functional deficits in the context of the above medical diagnosis(es)? Yes Addressing deficits in the following areas: balance,  endurance, locomotion, strength, transferring, bathing, dressing, grooming, toileting and psychosocial support 5. Can the patient actively participate in an intensive therapy program of at least 3 hrs of therapy per day at least 5 days per week? Yes 6. The potential for patient to make measurable gains while on inpatient rehab is excellent 7. Anticipated functional outcomes upon discharge from inpatient rehab are supervision  with PT, supervision with OT, n/a with SLP. 8. Estimated rehab length of stay to reach the above functional goals is: 6-10 days. 9. Anticipated D/C setting: Home 10. Anticipated post D/C treatments: HH therapy and Home excercise program 11. Overall Rehab/Functional Prognosis: good  RECOMMENDATIONS: This patient's condition is appropriate for continued rehabilitative care in the following setting: CIR after completion of medical workup Patient has agreed to participate in recommended program. Yes Note that insurance prior authorization may be required for reimbursement for recommended care.  Comment: Rehab Admissions Coordinator to follow up.  Delice Lesch, MD, ABPMR Cathlyn Parsons., PA-C 10/18/2016    Revision History                        Routing History

## 2016-10-21 ENCOUNTER — Inpatient Hospital Stay (HOSPITAL_COMMUNITY): Payer: Medicare Other

## 2016-10-21 ENCOUNTER — Ambulatory Visit (HOSPITAL_COMMUNITY): Payer: Medicare Other | Admitting: Physical Therapy

## 2016-10-21 ENCOUNTER — Inpatient Hospital Stay (HOSPITAL_COMMUNITY): Payer: Self-pay | Admitting: Physical Therapy

## 2016-10-21 ENCOUNTER — Inpatient Hospital Stay (HOSPITAL_COMMUNITY): Payer: Medicare Other | Admitting: Speech Pathology

## 2016-10-21 ENCOUNTER — Inpatient Hospital Stay (HOSPITAL_COMMUNITY): Payer: Medicare Other | Admitting: Occupational Therapy

## 2016-10-21 LAB — GLUCOSE, CAPILLARY
Glucose-Capillary: 143 mg/dL — ABNORMAL HIGH (ref 65–99)
Glucose-Capillary: 120 mg/dL — ABNORMAL HIGH (ref 65–99)
Glucose-Capillary: 85 mg/dL (ref 65–99)
Glucose-Capillary: 103 mg/dL — ABNORMAL HIGH (ref 65–99)

## 2016-10-21 NOTE — Progress Notes (Addendum)
Occupational Therapy Session Note  Patient Details  Name: LILLIONA BLAKENEY MRN: [id number] Date of Birth: Feb 03, 1945  Today's Date: 10/21/2016 OT Individual Time: 1415-1500 OT Individual Time Calculation (min): 45 min    Short Term Goals: Week 1:  OT Short Term Goal 1 (Week 1): STG=LTG  Skilled Therapeutic Interventions/Progress Updates:    OT treatment session focused on L NMR. Facilitated weight bearing through L UE then worked on active ROM of shoulder, elbow.wrist, hand with focus on normal movement patterns. Pt's main limitation is L hand function. Incorporated functional grasp/release activity with cup stacking. Pt initially required assist for grasp and release,  Then was able to grasp cup slightly, with improved finger extension to release cup. Pt returned to room at end of session and left with needs met.   Therapy Documentation Precautions:  Precautions Precautions: Fall Restrictions Weight Bearing Restrictions: No Pain: None Other Treatments:    See Function Navigator for Current Functional Status.   Therapy/Group: Individual Therapy  Valma Cava 10/21/2016, 3:03 PM

## 2016-10-21 NOTE — Progress Notes (Signed)
Social Work Patient ID: Sarah [name], female   DOB: [birth year], 71 y.o.   MRN: [id number]  Met with pt to discuss team conference rand goals-supervision level with target discharge date 10/18. Pt is hopeful she will exceed this and become mod/i level. Will work on discharge needs and make neuro-psych referral per pt's request.

## 2016-10-21 NOTE — Progress Notes (Signed)
Subjective/Complaints:  Slept ok, fatigued after therapy yesterday  ROS:  Denies CP, SOB, N/V/D, last BM 10/10  Objective: Vital Signs: Blood pressure 112/70, pulse 75, temperature 98.3 F (36.8 C), temperature source Oral, resp. rate 16, height 5' (1.524 m), weight 61 kg (134 lb 7.5 oz), SpO2 97 %. Mr Brain Wo Contrast  Result Date: 10/19/2016 CLINICAL DATA:  Worsening of left arm and hand weakness. Mild numbness and tingling. Dysarthria. EXAM: MRI HEAD WITHOUT CONTRAST TECHNIQUE: Multiplanar, multiecho pulse sequences of the brain and surrounding structures were obtained without intravenous contrast. COMPARISON:  10/16/2016 FINDINGS: Brain: Patchy right MCA territory infarcts are again seen and largely demonstrate stable to decreased trace diffusion signal abnormality. However, the largest confluent focus of infarction, located in the right centrum semiovale, has minimally enlarged (maximally 1.9 cm, previously 1.6 cm). No new infarcts are identified elsewhere. T2 hyperintensities in the periventricular white matter bilaterally are unchanged and nonspecific but compatible with chronic small vessel ischemic disease, mildly advanced for age. No intracranial hemorrhage, mass, midline shift, or extra-axial fluid collection is identified. Vascular: Major intracranial vascular flow voids are preserved. Skull and upper cervical spine: Unremarkable bone marrow signal. Sinuses/Orbits: Unremarkable orbits. Paranasal sinuses and mastoid air cells are clear. Other: None. IMPRESSION: 1. Slight enlargement of an acute infarct in the right centrum semiovale. 2. Unchanged size of other patchy evolving right MCA territory infarcts. Electronically Signed   By: Logan Bores M.D.   On: 10/19/2016 15:23   Results for orders placed or performed during the hospital encounter of 10/19/16 (from the past 72 hour(s))  Glucose, capillary     Status: Abnormal   Collection Time: 10/19/16  9:11 PM  Result Value Ref Range   Glucose-Capillary 169 (H) 65 - 99 mg/dL  Comprehensive metabolic panel     Status: Abnormal   Collection Time: 10/19/16 11:53 PM  Result Value Ref Range   Sodium 137 135 - 145 mmol/L   Potassium 3.5 3.5 - 5.1 mmol/L   Chloride 104 101 - 111 mmol/L   CO2 25 22 - 32 mmol/L   Glucose, Bld 162 (H) 65 - 99 mg/dL   BUN 16 6 - 20 mg/dL   Creatinine, Ser 0.56 0.44 - 1.00 mg/dL   Calcium 9.5 8.9 - 10.3 mg/dL   Total Protein 5.7 (L) 6.5 - 8.1 g/dL   Albumin 2.7 (L) 3.5 - 5.0 g/dL   AST 14 (L) 15 - 41 U/L   ALT 9 (L) 14 - 54 U/L   Alkaline Phosphatase 63 38 - 126 U/L   Total Bilirubin 0.4 0.3 - 1.2 mg/dL   GFR calc non Af Amer >60 >60 mL/min   GFR calc Af Amer >60 >60 mL/min    Comment: (NOTE) The eGFR has been calculated using the CKD EPI equation. This calculation has not been validated in all clinical situations. eGFR's persistently <60 mL/min signify possible Chronic Kidney Disease.    Anion gap 8 5 - 15  CBC WITH DIFFERENTIAL     Status: Abnormal   Collection Time: 10/20/16  6:23 AM  Result Value Ref Range   WBC 7.5 4.0 - 10.5 K/uL   RBC 3.78 (L) 3.87 - 5.11 MIL/uL   Hemoglobin 11.5 (L) 12.0 - 15.0 g/dL   HCT 35.0 (L) 36.0 - 46.0 %   MCV 92.6 78.0 - 100.0 fL   MCH 30.4 26.0 - 34.0 pg   MCHC 32.9 30.0 - 36.0 g/dL   RDW 12.7 11.5 - 15.5 %   Platelets  325 150 - 400 K/uL   Neutrophils Relative % 50 %   Neutro Abs 3.8 1.7 - 7.7 K/uL   Lymphocytes Relative 39 %   Lymphs Abs 2.9 0.7 - 4.0 K/uL   Monocytes Relative 8 %   Monocytes Absolute 0.6 0.1 - 1.0 K/uL   Eosinophils Relative 3 %   Eosinophils Absolute 0.2 0.0 - 0.7 K/uL   Basophils Relative 0 %   Basophils Absolute 0.0 0.0 - 0.1 K/uL  Glucose, capillary     Status: Abnormal   Collection Time: 10/20/16  6:53 AM  Result Value Ref Range   Glucose-Capillary 112 (H) 65 - 99 mg/dL  Glucose, capillary     Status: Abnormal   Collection Time: 10/20/16 11:39 AM  Result Value Ref Range   Glucose-Capillary 170 (H) 65 - 99 mg/dL      HEENT: poor dentition Cardio: RRR and no murmur Resp: CTA B/L and unlabored GI: BS positive and NT, ND Extremity:  No Edema Skin:   Intact Neuro: Alert/Oriented, Normal Sensory, Abnormal Motor 3/5 Left delt, bi, tri, 2- finger flex and ext, 3/5 L HF, KE, ADF, Abnormal FMC Ataxic/ dec FMC and Dysarthric Musc/Skel:  Normal, no pain with UE or LE ROM Gen NAD   Assessment/Plan: 1. Functional deficits secondary to Left hemiparesis, R MCA infarct which require 3+ hours per day of interdisciplinary therapy in a comprehensive inpatient rehab setting. Physiatrist is providing close team supervision and 24 hour management of active medical problems listed below. Physiatrist and rehab team continue to assess barriers to discharge/monitor patient progress toward functional and medical goals. FIM: Function - Bathing Position: Shower Body parts bathed by patient: Left arm, Chest, Abdomen, Front perineal area, Buttocks, Right upper leg, Left upper leg, Right lower leg, Left lower leg Body parts bathed by helper: Right arm, Back Assist Level: Touching or steadying assistance(Pt > 75%)  Function- Upper Body Dressing/Undressing What is the patient wearing?: Pull over shirt/dress Pull over shirt/dress - Perfomed by patient: Thread/unthread right sleeve, Put head through opening, Thread/unthread left sleeve, Pull shirt over trunk Function - Lower Body Dressing/Undressing What is the patient wearing?: Underwear, Pants, Socks, Shoes Position: Sitting EOB Underwear - Performed by patient: Thread/unthread right underwear leg, Thread/unthread left underwear leg Underwear - Performed by helper: Pull underwear up/down Pants- Performed by patient: Thread/unthread right pants leg, Thread/unthread left pants leg, Pull pants up/down Socks - Performed by patient: Don/doff right sock, Don/doff left sock Assist for lower body dressing: Touching or steadying assistance (Pt > 75%)  Function - Toileting Toileting  steps completed by patient: Adjust clothing prior to toileting, Performs perineal hygiene (2/2 steps) Assist level: Touching or steadying assistance (Pt.75%)  Function - Toilet Transfers Assist level to toilet: Touching or steadying assistance (Pt > 75%) Assist level from toilet: Touching or steadying assistance (Pt > 75%)  Function - Chair/bed transfer Chair/bed transfer method: Ambulatory Chair/bed transfer assist level: Touching or steadying assistance (Pt > 75%) Chair/bed transfer details: Verbal cues for sequencing, Verbal cues for technique, Verbal cues for precautions/safety  Function - Locomotion: Wheelchair Will patient use wheelchair at discharge?: No Function - Locomotion: Ambulation Assistive device: Walker-rolling Agricultural consultant) Max distance: 100' Assist level: Touching or steadying assistance (Pt > 75%) Assist level: Touching or steadying assistance (Pt > 75%) Assist level: Touching or steadying assistance (Pt > 75%) Assist level: Touching or steadying assistance (Pt > 75%)  Function - Comprehension Comprehension: Auditory Comprehension assist level: Follows complex conversation/direction with extra time/assistive device  Function - Expression  Expression: Verbal Expression assist level: Expresses complex 90% of the time/cues < 10% of the time  Function - Social Interaction Social Interaction assist level: Interacts appropriately with others - No medications needed.  Function - Problem Solving Problem solving assist level: Solves complex 90% of the time/cues < 10% of the time  Function - Memory Memory assist level: More than reasonable amount of time Patient normally able to recall (first 3 days only): Current season, Location of own room, Staff names and faces, That he or she is in a hospital  Medical Problem List and Plan: 1.  Left-sided weakness secondary to right MCA infarction. Status post loop recorder             -CIR PT, OT,SLP  2.  DVT  Prophylaxis/Anticoagulation: SCD.Monitor for any signs of DVT 3. Pain Management: Hydrocodone as needed 4. Mood: Provide emotional support 5. Neuropsych: This patient is capable of making decisions on her own behalf. 6. Skin/Wound Care: Routine skin checks 7. Fluids/Electrolytes/Nutrition: Routine I&O with follow up  BMET wnl except elevated glucose 8.Diabetes mellitus peripheral neuropathy.Hemoglobin A1c 12.9.Glucophage 1000 mg twice a day,NovoLog 10 units 3 times a day with meals, Lantus insulin 60 units daily. Check blood sugars before meals and at bedtime. Diabetic teaching Current meds are metformin 1066m BID, SSI only qhs,cont current meds 10/11 CBG (last 3)   Recent Labs  10/19/16 2111 10/20/16 0653 10/20/16 1139  GLUCAP 169* 112* 170*    9.Hypertension.No current antihypertensive medication. Patient on Norvasc 10 mg daily, Diovan-HCT 320-25 milligrams daily prior to admission. Resume as needed 10. Hyperlipidemia. Lipitor 11. COPD/ Tobacco abuse. Counseling 12.Suspect dental abscess. Empiric Augmentin 500 mg 3 times a day initiated 10/17/2016   LOS (Days) 2 A FACE TO FACE EVALUATION WAS PERFORMED  KIRSTEINS,ANDREW E 10/21/2016, 7:31 AM

## 2016-10-21 NOTE — Progress Notes (Signed)
Speech Language Pathology Daily Session Note  Patient Details  Name: Sarah [name] MRN: [id number] Date of Birth: 31-Jul-1945  Today's Date: 10/21/2016 SLP Individual Time: 1000-1045 SLP Individual Time Calculation (min): 45 min  Short Term Goals: Week 1: SLP Short Term Goal 1 (Week 1): Pt will complete complex problem solving tasks with supervision cues.  SLP Short Term Goal 2 (Week 1): Pt will demonstrate anticipatory awareness by listing 3 activities that are safe for her to participate in within home environment with supervision cues.  SLP Short Term Goal 3 (Week 1): Pt will utilize speech intelligibility strategies at the complex conversation level in moderately noisey environment to achieve >95% intelligibility with Mod I. SLP Short Term Goal 4 (Week 1): Pt will demonstrate selective attention in mildly distracting environment for ~ 45 minutes with supervision cues.   Skilled Therapeutic Interventions:    Skilled treatment session focused on cognition goals. SLP facilitated session by administering the Lipan - pt's glasses weren't with her in session and time was of the essence. Pt obtained score of 21 out 22 possible points with n=> 18. Although pt's score is considered within normal range, she required Min A to supervision cues to complete semi-complex scheduling task. Pt also demonstrates anxious behaviors and benefited from encouragement on progress and ability level. Would recommend having pt continue semi-complex tasks such as medication management and money management to promote increased functional independence. Pt was returned to room, left upright in wheelchair with all needs within reach. Continue per current plan of care.   Function:    Cognition Comprehension Comprehension assist level: Follows complex conversation/direction with extra time/assistive device  Expression   Expression assist level: Expresses complex 90% of the time/cues < 10% of the time  Social  Interaction Social Interaction assist level: Interacts appropriately with others with medication or extra time (anti-anxiety, antidepressant).;Interacts appropriately 90% of the time - Needs monitoring or encouragement for participation or interaction.  Problem Solving Problem solving assist level: Solves complex 90% of the time/cues < 10% of the time  Memory Memory assist level: More than reasonable amount of time    Pain    Therapy/Group: Individual Therapy  Kaitelyn Jamison 10/21/2016, 1:32 PM

## 2016-10-21 NOTE — Progress Notes (Signed)
Physical Therapy Session Note  Patient Details  Name: Sarah [name] MRN: [id number] Date of Birth: 1945/01/21  Today's Date: 10/21/2016 PT Individual Time: 1500-1600 PT Individual Time Calculation (min): 60 min   Short Term Goals: Week 1:  PT Short Term Goal 1 (Week 1): STG = LTG due to LOS  Skilled Therapeutic Interventions/Progress Updates:    no c/o pain throughout session; session focused on balance and functional mobility.   Pt performed sit > stand with supervision throughout session. Pt amb 150' to second gym doors from pt room with min A for steadying and guarding, pt used railing intermittently for comfort. Pt expressed enthusiasm for Wii bowling this session. Pt performed first game on level ground with supervision and with score of 155, second game on blue AirEx pad with min A for steadying for static standing balance with a focus on ankle strategy and with a score of 161 but experienced 1 LOB that required PT assist to recover from, third game with wedge, half with tall end on the right and half with the tall end in front of pt, requiring min to max A for balance and v/c for correcting posture and lean with focus on static standing balance and hip strategy with a score of 192. Pt required rest breaks between each game due to fatigue. Pt performed TUG x 3 trials with average of 12.86 seconds. Pt amb 150' back to pt room from far end of gym. Pt returned to bed stand > sit with supervision and sit > L sidelying with supervision. Pt left L sidelying in bed with call bell within reach, all needs addressed.   Therapy Documentation Precautions:  Precautions Precautions: Fall Restrictions Weight Bearing Restrictions: No  See Function Navigator for Current Functional Status.   Therapy/Group: Individual Therapy  [name] 10/21/2016, 4:42 PM

## 2016-10-21 NOTE — Progress Notes (Addendum)
Physical Therapy Session Note  Patient Details  Name: Sarah [name] MRN: [id number] Date of Birth: 06/14/1945  Today's Date: 10/21/2016 PT Individual Time: 4098-1191 PT Individual Time Calculation (min): 40 min   Short Term Goals: Week 1:  PT Short Term Goal 1 (Week 1): STG = LTG due to LOS  Skilled Therapeutic Interventions/Progress Updates:    Session focused on NMR to address postural control, dynamic balance, fine motor of LUE, and overall coordination and strengthening of LLE/LUE.  Fine motor task to manipulate pill bottles and reach and place into container with LUE only (utilized RUE to stabilize container) while on compliant surface to address dynamic balance. Toe taps to work on foot placement and clearance on 4" step 15 reps on each side with min assist. Stair negotiation x 8 steps with R rail and HHA on L with overall min assist and cues for sequencing and foot placement of LLE. Floor ladder activity to address balance and coordination for forward, retro, and lateral stepping to R and L with min assist and cues for attention to foot placement on L to decrease fall risk. Gait without AD to/from therapy gym with focus on LLE clearance and greater step length with min facilitation for weightshifting and L HHA overall min assist for normalizing gait pattern and mod verbal cues.   Addendum:  Five times Sit to Stand Test (FTSS) Method: Use a straight back chair with a solid seat that is 16-18" high. Ask participant to sit on the chair with arms folded across their chest.   Instructions: "Stand up and sit down as quickly as possible 5 times, keeping your arms folded across your chest."   Measurement: Stop timing when the participant stands the 5th time.  TIME: ___9___ (in seconds)  Times > 13.6 seconds is associated with increased disability and morbidity (Guralnik, 2000) Times > 15 seconds is predictive of recurrent falls in healthy individuals aged 38 and older (Buatois, et  al., 2008) Normal performance values in community dwelling individuals aged 79 and older (Bohannon, 2006): o 60-69 years: 11.4 seconds o 70-79 years: 12.6 seconds o 80-89 years: 14.8 seconds  MCID: ? 2.3 seconds for Vestibular Disorders [name], 2006)   Therapy Documentation Precautions:  Precautions Precautions: Fall Restrictions Weight Bearing Restrictions: No   Pain:  Denies pain.    See Function Navigator for Current Functional Status.   Therapy/Group: Individual Therapy  Canary Brim Ivory Broad, PT, DPT  10/21/2016, 11:58 AM

## 2016-10-22 ENCOUNTER — Inpatient Hospital Stay (HOSPITAL_COMMUNITY): Payer: Medicare Other | Admitting: Speech Pathology

## 2016-10-22 ENCOUNTER — Inpatient Hospital Stay (HOSPITAL_COMMUNITY): Payer: Medicare Other | Admitting: Occupational Therapy

## 2016-10-22 ENCOUNTER — Inpatient Hospital Stay (HOSPITAL_COMMUNITY): Payer: Self-pay | Admitting: Physical Therapy

## 2016-10-22 LAB — GLUCOSE, CAPILLARY
Glucose-Capillary: 87 mg/dL (ref 65–99)
Glucose-Capillary: 147 mg/dL — ABNORMAL HIGH (ref 65–99)
Glucose-Capillary: 140 mg/dL — ABNORMAL HIGH (ref 65–99)
Glucose-Capillary: 145 mg/dL — ABNORMAL HIGH (ref 65–99)
Glucose-Capillary: 134 mg/dL — ABNORMAL HIGH (ref 65–99)
Glucose-Capillary: 132 mg/dL — ABNORMAL HIGH (ref 65–99)

## 2016-10-22 NOTE — Progress Notes (Signed)
Occupational Therapy Session Note  Patient Details  Name: Sarah [name] MRN: [id number] Date of Birth: Aug 08, 1945  Today's Date: 10/22/2016 OT Individual Time: 1101-1230 OT Individual Time Calculation (min): 89 min    Short Term Goals: Week 1:  OT Short Term Goal 1 (Week 1): STG=LTG  Skilled Therapeutic Interventions/Progress Updates:    OT treatment session focused on modified bathing/dressing, functional use of L UE, and L NMR. Pt requested to sink bathe today. Incorporated forced use of L hand within bathing/dressing focused on pinch and grasp. Also incorporated weight bearing through L hand when standing for grooming tasks.  Pt needed min A to recall hemi-techniques and thread L UE through shirt and min A for LB dressing to grasp clothing with L hand. Core strengthening and weight bearing through L UE in quadruped on therapy mat using card matching game. Min elbow support to L UE when shifting weight forward.  After quadruped activity, pt began crying. Stated she gets sad when she think about how she can't use her hand like she used to. Asked pt if she felt like she had difficulty controlling her emotions and she said "yes, at times" Educated pt on how strokes can affect emotions and if she felt like she needed help in this area to let someone know. Pt appreciated discussion. Continued working on L hand with soft tan theraputty. Addressed pinch strength with improve thumb adduction/abduction. Pt returned to room and left seated in wc with needs met.   Therapy Documentation Precautions:  Precautions Precautions: Fall Restrictions Weight Bearing Restrictions: No Pain: Pain Assessment Pain Assessment: No/denies pain Pain Score: 0-No pain ADL: ADL ADL Comments: see functional navigator  See Function Navigator for Current Functional Status.   Therapy/Group: Individual Therapy  Valma Cava 10/22/2016, 12:04 PM

## 2016-10-22 NOTE — Progress Notes (Signed)
Subjective/Complaints: Patient is without new complaints today. ROS:  Denies CP, SOB, N/V/D, last BM 10/10  Objective: Vital Signs: Blood pressure (!) 162/67, pulse 72, temperature 98 F (36.7 C), temperature source Oral, resp. rate 17, height 5' (1.524 m), weight 61 kg (134 lb 7.5 oz), SpO2 98 %. No results found. Results for orders placed or performed during the hospital encounter of 10/19/16 (from the past 72 hour(s))  Glucose, capillary     Status: Abnormal   Collection Time: 10/19/16  9:11 PM  Result Value Ref Range   Glucose-Capillary 169 (H) 65 - 99 mg/dL  Comprehensive metabolic panel     Status: Abnormal   Collection Time: 10/19/16 11:53 PM  Result Value Ref Range   Sodium 137 135 - 145 mmol/L   Potassium 3.5 3.5 - 5.1 mmol/L   Chloride 104 101 - 111 mmol/L   CO2 25 22 - 32 mmol/L   Glucose, Bld 162 (H) 65 - 99 mg/dL   BUN 16 6 - 20 mg/dL   Creatinine, Ser 0.56 0.44 - 1.00 mg/dL   Calcium 9.5 8.9 - 10.3 mg/dL   Total Protein 5.7 (L) 6.5 - 8.1 g/dL   Albumin 2.7 (L) 3.5 - 5.0 g/dL   AST 14 (L) 15 - 41 U/L   ALT 9 (L) 14 - 54 U/L   Alkaline Phosphatase 63 38 - 126 U/L   Total Bilirubin 0.4 0.3 - 1.2 mg/dL   GFR calc non Af Amer >60 >60 mL/min   GFR calc Af Amer >60 >60 mL/min    Comment: (NOTE) The eGFR has been calculated using the CKD EPI equation. This calculation has not been validated in all clinical situations. eGFR's persistently <60 mL/min signify possible Chronic Kidney Disease.    Anion gap 8 5 - 15  CBC WITH DIFFERENTIAL     Status: Abnormal   Collection Time: 10/20/16  6:23 AM  Result Value Ref Range   WBC 7.5 4.0 - 10.5 K/uL   RBC 3.78 (L) 3.87 - 5.11 MIL/uL   Hemoglobin 11.5 (L) 12.0 - 15.0 g/dL   HCT 35.0 (L) 36.0 - 46.0 %   MCV 92.6 78.0 - 100.0 fL   MCH 30.4 26.0 - 34.0 pg   MCHC 32.9 30.0 - 36.0 g/dL   RDW 12.7 11.5 - 15.5 %   Platelets 325 150 - 400 K/uL   Neutrophils Relative % 50 %   Neutro Abs 3.8 1.7 - 7.7 K/uL   Lymphocytes  Relative 39 %   Lymphs Abs 2.9 0.7 - 4.0 K/uL   Monocytes Relative 8 %   Monocytes Absolute 0.6 0.1 - 1.0 K/uL   Eosinophils Relative 3 %   Eosinophils Absolute 0.2 0.0 - 0.7 K/uL   Basophils Relative 0 %   Basophils Absolute 0.0 0.0 - 0.1 K/uL  Glucose, capillary     Status: Abnormal   Collection Time: 10/20/16  6:53 AM  Result Value Ref Range   Glucose-Capillary 112 (H) 65 - 99 mg/dL  Glucose, capillary     Status: Abnormal   Collection Time: 10/20/16 11:39 AM  Result Value Ref Range   Glucose-Capillary 170 (H) 65 - 99 mg/dL  Glucose, capillary     Status: Abnormal   Collection Time: 10/20/16  5:01 PM  Result Value Ref Range   Glucose-Capillary 143 (H) 65 - 99 mg/dL  Glucose, capillary     Status: Abnormal   Collection Time: 10/20/16  9:02 PM  Result Value Ref Range   Glucose-Capillary  120 (H) 65 - 99 mg/dL  Glucose, capillary     Status: None   Collection Time: 10/21/16  6:19 AM  Result Value Ref Range   Glucose-Capillary 85 65 - 99 mg/dL   Comment 1 Notify RN   Glucose, capillary     Status: Abnormal   Collection Time: 10/21/16 12:16 PM  Result Value Ref Range   Glucose-Capillary 103 (H) 65 - 99 mg/dL     HEENT: poor dentition Cardio: RRR and no murmur Resp: CTA B/L and unlabored GI: BS positive and NT, ND Extremity:  No Edema Skin:   Intact Neuro: Alert/Oriented, Normal Sensory, Abnormal Motor 3/5 Left delt, bi, tri, 2- finger flex and ext, 3/5 L HF, KE, ADF, Abnormal FMC Ataxic/ dec FMC and Dysarthric Musc/Skel:  Normal, no pain with UE or LE ROM Gen NAD   Assessment/Plan: 1. Functional deficits secondary to Left hemiparesis, R MCA infarct which require 3+ hours per day of interdisciplinary therapy in a comprehensive inpatient rehab setting. Physiatrist is providing close team supervision and 24 hour management of active medical problems listed below. Physiatrist and rehab team continue to assess barriers to discharge/monitor patient progress toward functional  and medical goals. FIM: Function - Bathing Position: Shower Body parts bathed by patient: Left arm, Chest, Abdomen, Front perineal area, Buttocks, Right upper leg, Left upper leg, Right lower leg, Left lower leg Body parts bathed by helper: Right arm, Back Assist Level: Touching or steadying assistance(Pt > 75%)  Function- Upper Body Dressing/Undressing What is the patient wearing?: Pull over shirt/dress Pull over shirt/dress - Perfomed by patient: Thread/unthread right sleeve, Put head through opening, Thread/unthread left sleeve, Pull shirt over trunk Function - Lower Body Dressing/Undressing What is the patient wearing?: Underwear, Pants, Socks, Shoes Position: Sitting EOB Underwear - Performed by patient: Thread/unthread right underwear leg, Thread/unthread left underwear leg Underwear - Performed by helper: Pull underwear up/down Pants- Performed by patient: Thread/unthread right pants leg, Thread/unthread left pants leg, Pull pants up/down Socks - Performed by patient: Don/doff right sock, Don/doff left sock Assist for lower body dressing: Touching or steadying assistance (Pt > 75%)  Function - Toileting Toileting steps completed by patient: Adjust clothing prior to toileting, Performs perineal hygiene (2/2 steps) Assist level: Touching or steadying assistance (Pt.75%)  Function - Toilet Transfers Assist level to toilet: Touching or steadying assistance (Pt > 75%) Assist level from toilet: No Help, no cues, assistive device, takes more than a reasonable amount of time  Function - Chair/bed transfer Chair/bed transfer method: Ambulatory Chair/bed transfer assist level: Touching or steadying assistance (Pt > 75%) Chair/bed transfer assistive device: Armrests Chair/bed transfer details: Verbal cues for precautions/safety  Function - Locomotion: Wheelchair Will patient use wheelchair at discharge?: No Function - Locomotion: Ambulation Assistive device: No device, Hand held  assist Max distance: 150' Assist level: Touching or steadying assistance (Pt > 75%) Assist level: Touching or steadying assistance (Pt > 75%) Assist level: Touching or steadying assistance (Pt > 75%) Assist level: Touching or steadying assistance (Pt > 75%) Assist level: Touching or steadying assistance (Pt > 75%)  Function - Comprehension Comprehension: Auditory Comprehension assist level: Follows complex conversation/direction with extra time/assistive device  Function - Expression Expression: Verbal Expression assist level: Expresses complex ideas: With extra time/assistive device  Function - Social Interaction Social Interaction assist level: Interacts appropriately with others with medication or extra time (anti-anxiety, antidepressant).  Function - Problem Solving Problem solving assist level: Solves complex 90% of the time/cues < 10% of the time  Function - Memory Memory assist level: More than reasonable amount of time Patient normally able to recall (first 3 days only): Current season, Location of own room, Staff names and faces, That he or she is in a hospital  Medical Problem List and Plan: 1.  Left-sided weakness secondary to right MCA infarction. Status post loop recorder             -CIR PT, OT,SLP  2.  DVT Prophylaxis/Anticoagulation: SCD.Monitor for any signs of DVT 3. Pain Management: Hydrocodone as needed 4. Mood: Provide emotional support 5. Neuropsych: This patient is capable of making decisions on her own behalf. 6. Skin/Wound Care: Routine skin checks 7. Fluids/Electrolytes/Nutrition: Routine I&O with follow up  BMET wnl except elevated glucose 8.Diabetes mellitus peripheral neuropathy.Hemoglobin A1c 12.9.Glucophage 1000 mg twice a day,NovoLog 10 units 3 times a day with meals, Lantus insulin 60 units daily. Check blood sugars before meals and at bedtime. Diabetic teaching Current meds are metformin 1055m BID, SSI only qhs,cont current meds 10/12 CBG (last  3)   Recent Labs  10/20/16 2102 10/21/16 0619 10/21/16 1216  GLUCAP 120* 85 103*    9.Hypertension.No current antihypertensive medication. Patient on Norvasc 10 mg daily, Diovan-HCT 320-25 milligrams daily prior to admission. Resume as needed 10. Hyperlipidemia. Lipitor 11. COPD/ Tobacco abuse. Counseling 12.Suspect dental abscess. Empiric Augmentin 500 mg 3 times a day initiated 10/17/2016   LOS (Days) 3 A FACE TO FACE EVALUATION WAS PERFORMED  KIRSTEINS,ANDREW E 10/22/2016, 7:45 AM

## 2016-10-22 NOTE — IPOC Note (Signed)
Overall Plan of Care [facility]) Patient Details Name: JENAVIEVE FREDA MRN: [id number] DOB: 1945-10-25  Admitting Diagnosis: <principal problem not specified>right mca infarct  Hospital Problems: Active Problems:   Right middle cerebral artery stroke [facility])     Functional Problem List: Nursing    PT Balance, Behavior, Endurance, Motor, Perception, Safety  OT Balance, Safety, Sensory, Cognition, Endurance, Motor, Perception  SLP Cognition  TR         Basic ADL's: OT Grooming, Bathing, Dressing, Toileting     Advanced  ADL's: OT Simple Meal Preparation     Transfers: PT Bed to Chair, Car  OT Toilet, Tub/Shower     Locomotion: PT Ambulation, Stairs     Additional Impairments: OT    SLP Social Cognition, Communication expression Problem Solving, Attention, Awareness  TR      Anticipated Outcomes Item Anticipated Outcome  Self Feeding mod I   Swallowing      Basic self-care  mod I   Toileting  mod I    Bathroom Transfers mod I  Bowel/Bladder  Continent of bowel and bladder with min assist   Transfers  supervision  Locomotion  supervision with rollator  Communication  Mod I at complex conversation  Cognition  Supervision to Mod I  Pain  Pain less than 2 out of 10 on a scale of 1-10  Safety/Judgment  Pt will be free of injury and fall with min assist by discharge date   Therapy Plan: PT Intensity: Minimum of 1-2 x/day ,45 to 90 minutes PT Frequency: 5 out of 7 days PT Duration Estimated Length of Stay: 7-10 days OT Intensity: Minimum of 1-2 x/day, 45 to 90 minutes OT Frequency: 5 out of 7 days OT Duration/Estimated Length of Stay: ~7-10 days SLP Intensity: Minumum of 1-2 x/day, 30 to 90 minutes SLP Frequency: 3 to 5 out of 7 days SLP Duration/Estimated Length of Stay: 7 to 10 days    Team Interventions: Nursing Interventions Patient/Family Education, Disease Management/Prevention, Skin Care/Wound Management, Pain Management, Medication Management,  Psychosocial Support, Discharge Planning  PT interventions Ambulation/gait training, Balance/vestibular training, Cognitive remediation/compensation, Discharge planning, DME/adaptive equipment instruction, Functional mobility training, Neuromuscular re-education, Patient/family education, Stair training, Splinting/orthotics, Therapeutic Activities, Therapeutic Exercise, UE/LE Strength taining/ROM, UE/LE Coordination activities  OT Interventions Balance/vestibular training, Discharge planning, Functional electrical stimulation, Pain management, Self Care/advanced ADL retraining, Therapeutic Activities, Therapeutic Exercise, Patient/family education, Functional mobility training, Disease mangement/prevention, Cognitive remediation/compensation, Community reintegration, DME/adaptive equipment instruction, Neuromuscular re-education, Psychosocial support, UE/LE Strength taining/ROM  SLP Interventions Cognitive remediation/compensation, Cueing hierarchy, Functional tasks, Medication managment, Patient/family education  TR Interventions    SW/CM Interventions Discharge Planning, Psychosocial Support, Patient/Family Education   Barriers to Discharge MD  Medical stability  Nursing      PT      OT      SLP      SW       Team Discharge Planning: Destination: PT-Home ,OT- Home , SLP-Home Projected Follow-up: PT-Home health PT, OT-  Outpatient OT, SLP-None Projected Equipment Needs: PT-To be determined, OT- To be determined, SLP-None recommended by SLP Equipment Details: PT- , OT-  Patient/family involved in discharge planning: PT- Patient,  OT-Patient, SLP-Patient  MD ELOS: 7-10 days Medical Rehab Prognosis:  Excellent Assessment: The patient has been admitted for CIR therapies with the diagnosis of right mca infarct. The team will be addressing functional mobility, strength, stamina, balance, safety, adaptive techniques and equipment, self-care, bowel and bladder mgt, patient and caregiver  education, NMR, visual-spatial awareness, cognition, communication,  ego support. Goals have been set at mod I to supervision with mobility, self-care and cognition.    Meredith Staggers, MD, FAAPMR      See Team Conference Notes for weekly updates to the plan of care

## 2016-10-22 NOTE — Progress Notes (Signed)
Speech Language Pathology Daily Session Note  Patient Details  Name: JADEYN HARGETT MRN: [id number] Date of Birth: 22-Jul-1945  Today's Date: 10/22/2016 SLP Individual Time: 0930-1000 SLP Individual Time Calculation (min): 30 min  Short Term Goals: Week 1: SLP Short Term Goal 1 (Week 1): Pt will complete complex problem solving tasks with supervision cues.  SLP Short Term Goal 2 (Week 1): Pt will demonstrate anticipatory awareness by listing 3 activities that are safe for her to participate in within home environment with supervision cues.  SLP Short Term Goal 3 (Week 1): Pt will utilize speech intelligibility strategies at the complex conversation level in moderately noisey environment to achieve >95% intelligibility with Mod I. SLP Short Term Goal 4 (Week 1): Pt will demonstrate selective attention in mildly distracting environment for ~ 45 minutes with supervision cues.   Skilled Therapeutic Interventions:   Skilled treatment session focused on cognition goals. SLP facilitated session by providing more than a reasonable amount of time to make a grocery store list for a specified amount of money with perimeters for specific items (i.e., meat, side dishes, bread). Pt able to complete but required extra time. Pt continues to demonstrate flat affect despite encouragement. Pt able to demonstrate selective attention for ~ 30 minutes with supervision cues. Pt returned to room, left upright in wheelchair with all needs within reach. Continue per current plan of care.   Function:    Cognition Comprehension Comprehension assist level: Follows complex conversation/direction with extra time/assistive device  Expression   Expression assist level: Expresses complex ideas: With extra time/assistive device  Social Interaction Social Interaction assist level: Interacts appropriately with others with medication or extra time (anti-anxiety, antidepressant).  Problem Solving Problem solving assist  level: Solves complex 90% of the time/cues < 10% of the time;Solves complex problems: With extra time  Memory Memory assist level: More than reasonable amount of time    Pain Pain Assessment Pain Assessment: No/denies pain Pain Score: 0-No pain  Therapy/Group: Individual Therapy  Adrienna Karis 10/22/2016, 12:28 PM

## 2016-10-22 NOTE — Progress Notes (Signed)
Physical Therapy Session Note  Patient Details  Name: Sarah [name] MRN: [id number] Date of Birth: Oct 07, 1945  Today's Date: 10/22/2016 PT Individual Time: 1445-1600 PT Individual Time Calculation (min): 75 min   Short Term Goals: Week 1:  PT Short Term Goal 1 (Week 1): STG = LTG due to LOS  Skilled Therapeutic Interventions/Progress Updates:    c/o HA 8/10 on R side, given medication by nurse; session focused on balance training, activity tolerance, and gait.   Pt amb 250' from pt room down to day room with min A for steadying. Pt performed Biodex Random Control on large circle size and medium speed, 3 trials: 1 min with 72%, 1 min with 53%, 1:30 min with 74%. Pt required frequent rest breaks between each trial and dozed off at times between trials. Pt reported feeling extremely fatigued after Biodex. Pt performed NuStep on workload 3 x 10 min to work on activity tolerance and LE strengthening, using BUEs and BLEs. Pt amb 250' back to pt room with 1 sitting rest break along the way. Pt performed 3/3 toileting steps supervision and hand hygiene standing at sink with supervision. Pt performed bed mobility sit > supine with supervision, left supine in bed, call bell within reach, all needs addressed.   Therapy Documentation Precautions:  Precautions Precautions: Fall Restrictions Weight Bearing Restrictions: No  See Function Navigator for Current Functional Status.   Therapy/Group: Individual Therapy  [name] 10/22/2016, 4:24 PM

## 2016-10-23 ENCOUNTER — Inpatient Hospital Stay (HOSPITAL_COMMUNITY): Payer: Medicare Other

## 2016-10-23 ENCOUNTER — Inpatient Hospital Stay (HOSPITAL_COMMUNITY): Payer: Medicare Other | Admitting: Occupational Therapy

## 2016-10-23 ENCOUNTER — Inpatient Hospital Stay (HOSPITAL_COMMUNITY): Payer: Medicare Other | Admitting: Physical Therapy

## 2016-10-23 LAB — GLUCOSE, CAPILLARY
Glucose-Capillary: 127 mg/dL — ABNORMAL HIGH (ref 65–99)
Glucose-Capillary: 146 mg/dL — ABNORMAL HIGH (ref 65–99)
Glucose-Capillary: 149 mg/dL — ABNORMAL HIGH (ref 65–99)

## 2016-10-23 NOTE — Progress Notes (Signed)
Subjective/Complaints: No  ROS:  Denies CP, SOB, N/V/D, last BM 10/10  Objective: Vital Signs: Blood pressure (!) 120/50, pulse 71, temperature 98.2 F (36.8 C), temperature source Oral, resp. rate 18, height 5' (1.524 m), weight 61 kg (134 lb 7.5 oz), SpO2 95 %. No results found. Results for orders placed or performed during the hospital encounter of 10/19/16 (from the past 72 hour(s))  Glucose, capillary     Status: Abnormal   Collection Time: 10/20/16 11:39 AM  Result Value Ref Range   Glucose-Capillary 170 (H) 65 - 99 mg/dL  Glucose, capillary     Status: Abnormal   Collection Time: 10/20/16  5:01 PM  Result Value Ref Range   Glucose-Capillary 143 (H) 65 - 99 mg/dL  Glucose, capillary     Status: Abnormal   Collection Time: 10/20/16  9:02 PM  Result Value Ref Range   Glucose-Capillary 120 (H) 65 - 99 mg/dL  Glucose, capillary     Status: None   Collection Time: 10/21/16  6:19 AM  Result Value Ref Range   Glucose-Capillary 85 65 - 99 mg/dL   Comment 1 Notify RN   Glucose, capillary     Status: Abnormal   Collection Time: 10/21/16 12:16 PM  Result Value Ref Range   Glucose-Capillary 103 (H) 65 - 99 mg/dL  Glucose, capillary     Status: None   Collection Time: 10/21/16  5:00 PM  Result Value Ref Range   Glucose-Capillary 87 65 - 99 mg/dL  Glucose, capillary     Status: Abnormal   Collection Time: 10/21/16  8:23 PM  Result Value Ref Range   Glucose-Capillary 147 (H) 65 - 99 mg/dL  Glucose, capillary     Status: Abnormal   Collection Time: 10/22/16  6:56 AM  Result Value Ref Range   Glucose-Capillary 140 (H) 65 - 99 mg/dL  Glucose, capillary     Status: Abnormal   Collection Time: 10/22/16 11:37 AM  Result Value Ref Range   Glucose-Capillary 145 (H) 65 - 99 mg/dL  Glucose, capillary     Status: Abnormal   Collection Time: 10/22/16  4:42 PM  Result Value Ref Range   Glucose-Capillary 134 (H) 65 - 99 mg/dL  Glucose, capillary     Status: Abnormal   Collection Time:  10/22/16  9:16 PM  Result Value Ref Range   Glucose-Capillary 132 (H) 65 - 99 mg/dL  Glucose, capillary     Status: Abnormal   Collection Time: 10/23/16  6:57 AM  Result Value Ref Range   Glucose-Capillary 127 (H) 65 - 99 mg/dL     HEENT: poor dentition Cardio: RRR and no murmur Resp: CTA B/L and unlabored GI: BS positive and NT, ND Extremity:  No Edema Skin:   Intact Neuro: Alert/Oriented, Normal Sensory, Abnormal Motor 3/5 Left delt, bi, tri, 2- finger flex and ext, 3/5 L HF, KE, ADF, Abnormal FMC Ataxic/ dec FMC and Dysarthric Musc/Skel:  Normal, no pain with UE or LE ROM Gen NAD   Assessment/Plan: 1. Functional deficits secondary to Left hemiparesis, R MCA infarct which require 3+ hours per day of interdisciplinary therapy in a comprehensive inpatient rehab setting. Physiatrist is providing close team supervision and 24 hour management of active medical problems listed below. Physiatrist and rehab team continue to assess barriers to discharge/monitor patient progress toward functional and medical goals. FIM: Function - Bathing Position: Shower Body parts bathed by patient: Left arm, Chest, Abdomen, Front perineal area, Buttocks, Right upper leg, Left upper leg, Right  lower leg, Left lower leg Body parts bathed by helper: Right arm, Back Assist Level: Touching or steadying assistance(Pt > 75%)  Function- Upper Body Dressing/Undressing What is the patient wearing?: Pull over shirt/dress Pull over shirt/dress - Perfomed by patient: Thread/unthread right sleeve, Put head through opening, Thread/unthread left sleeve, Pull shirt over trunk Function - Lower Body Dressing/Undressing What is the patient wearing?: Underwear, Pants, Socks, Shoes Position: Sitting EOB Underwear - Performed by patient: Thread/unthread right underwear leg, Thread/unthread left underwear leg Underwear - Performed by helper: Pull underwear up/down Pants- Performed by patient: Thread/unthread right pants  leg, Thread/unthread left pants leg, Pull pants up/down Socks - Performed by patient: Don/doff right sock, Don/doff left sock Assist for lower body dressing: Touching or steadying assistance (Pt > 75%)  Function - Toileting Toileting steps completed by patient: Adjust clothing prior to toileting, Performs perineal hygiene Toileting steps completed by helper: Adjust clothing after toileting Toileting Assistive Devices: Grab bar or rail Assist level: Touching or steadying assistance (Pt.75%)  Function - Toilet Transfers Assist level to toilet: Touching or steadying assistance (Pt > 75%) Assist level from toilet: Touching or steadying assistance (Pt > 75%)  Function - Chair/bed transfer Chair/bed transfer method: Ambulatory Chair/bed transfer assist level: Touching or steadying assistance (Pt > 75%) Chair/bed transfer assistive device: Armrests Chair/bed transfer details: Verbal cues for precautions/safety  Function - Locomotion: Wheelchair Will patient use wheelchair at discharge?: No Function - Locomotion: Ambulation Assistive device: No device Max distance: 250' Assist level: Touching or steadying assistance (Pt > 75%) Assist level: Touching or steadying assistance (Pt > 75%) Assist level: Touching or steadying assistance (Pt > 75%) Assist level: Touching or steadying assistance (Pt > 75%) Assist level: Touching or steadying assistance (Pt > 75%)  Function - Comprehension Comprehension: Auditory Comprehension assist level: Follows complex conversation/direction with extra time/assistive device  Function - Expression Expression: Verbal Expression assist level: Expresses complex ideas: With extra time/assistive device  Function - Social Interaction Social Interaction assist level: Interacts appropriately with others with medication or extra time (anti-anxiety, antidepressant).  Function - Problem Solving Problem solving assist level: Solves complex 90% of the time/cues < 10%  of the time  Function - Memory Memory assist level: More than reasonable amount of time Patient normally able to recall (first 3 days only): Current season, Location of own room, Staff names and faces, That he or she is in a hospital  Medical Problem List and Plan: 1.  Left-sided weakness secondary to right MCA infarction. Status post loop recorder             -CIR PT, OT,SLP  2.  DVT Prophylaxis/Anticoagulation: SCD.Monitor for any signs of DVT 3. Pain Management: Hydrocodone as needed 4. Mood: Provide emotional support 5. Neuropsych: This patient is capable of making decisions on her own behalf. 6. Skin/Wound Care: Routine skin checks 7. Fluids/Electrolytes/Nutrition: Routine I&O with follow up  BMET wnl except elevated glucose 8.Diabetes mellitus peripheral neuropathy.Hemoglobin A1c 12.9.Glucophage 1000 mg twice a day,NovoLog 10 units 3 times a day with meals, Lantus insulin 60 units daily. Check blood sugars before meals and at bedtime. Diabetic teaching Current meds are metformin 1000mg  BID, SSI only qhs,cont current meds 10/13 CBG (last 3)   Recent Labs  10/22/16 1642 10/22/16 2116 10/23/16 0657  GLUCAP 134* 132* 127*    9.Hypertension.No current antihypertensive medication. Patient on Norvasc 10 mg daily, Diovan-HCT 320-25 milligrams daily prior to admission. Cont off meds Vitals:   10/22/16 1355 10/23/16 0500  BP: (!) 110/44 Marland Kitchen)  120/50  Pulse: 85 71  Resp: 18 18  Temp: 98.4 F (36.9 C) 98.2 F (36.8 C)  SpO2: 98% 95%   10. Hyperlipidemia. Lipitor 11. COPD/ Tobacco abuse. Counseling 12.Suspect dental abscess. Empiric Augmentin 500 mg 3 times a day initiated 10/17/2016, may d/c in am   LOS (Days) 4 A FACE TO FACE EVALUATION WAS PERFORMED  KIRSTEINS,ANDREW E 10/23/2016, 8:25 AM

## 2016-10-23 NOTE — Progress Notes (Signed)
Occupational Therapy Session Note  Patient Details  Name: Sarah [name] MRN: [id number] Date of Birth: 11/01/1945  Today's Date: 10/23/2016 OT Individual Time: 1530-1600 OT Individual Time Calculation (min): 30 min    Short Term Goals: Week 1:  OT Short Term Goal 1 (Week 1): STG=LTG  Skilled Therapeutic Interventions/Progress Updates:    Upon entering the room, pt seated in recliner chair awaiting therapist with no c/o pain. Pt ambulating to sink without use of AD for grooming tasks with supervision for safety. Pt ambulating to ADL apartment and removed dishes from dishwasher. Pt holding dishes with L hand and drying with R hand before placing back into cabinets. OT educating pt on various ways to safely move dishes and food from one location to the next. Pt verbalized understanding and education to continue. Pt returning back to room in same manner. Pt transferred onto toilet with steady assistance for BM. RN notified. All needs within reach.   Therapy Documentation Precautions:  Precautions Precautions: Fall Restrictions Weight Bearing Restrictions: No General:   Vital Signs: Therapy Vitals Temp: 98.5 F (36.9 C) Temp Source: Oral Pulse Rate: 89 Resp: 14 BP: 132/62 Patient Position (if appropriate): Sitting Oxygen Therapy SpO2: 98 % O2 Device: Not Delivered Pain:   ADL: ADL ADL Comments: see functional navigator Vision   Perception    Praxis   Exercises:   Other Treatments:    See Function Navigator for Current Functional Status.   Therapy/Group: Individual Therapy  [name] 10/23/2016, 4:25 PM

## 2016-10-23 NOTE — Progress Notes (Signed)
Physical Therapy Session Note  Patient Details  Name: Sarah [name] MRN: [id number] Date of Birth: 21-Oct-1945  Today's Date: 10/23/2016 PT Individual Time: 1300-1400 PT Individual Time Calculation (min): 60 min   Short Term Goals: Week 1:  PT Short Term Goal 1 (Week 1): STG = LTG due to LOS  Skilled Therapeutic Interventions/Progress Updates:   Pt supine upon arrival and agreeable to therapy, no c/o pain. Took increased time to get from supine to EOB w/ supervision and transferred to w/c via stand pivot transfer, Min guard. Total A w/c mobility to/from day room and worked on static standing balance on both firm and foam surface while participating in UE cognitive tasks. 2-3 seated rest breaks 2/2 fatigue, Min guard for standing balance. Worked on endurance 2nd half of session. Ambulated 100' x2 and performed NuStep 10 min @ L2 w/ LUE assist. NuStep also for reciprocal movement pattern and incorporating UEs w/ movement. Returned to room and ended session in recliner, call bell within reach and all needs met. Educated pt on importance of OOB activity to increased tolerance to upright. Pt verbalized understanding and in agreement.   Therapy Documentation Precautions:  Precautions Precautions: Fall Restrictions Weight Bearing Restrictions: No  See Function Navigator for Current Functional Status.   Therapy/Group: Individual Therapy  Dontae Minerva K Arnette 10/23/2016, 2:32 PM

## 2016-10-23 NOTE — Progress Notes (Signed)
Speech Language Pathology Daily Session Note  Patient Details  Name: Sarah [name] MRN: [id number] Date of Birth: Dec 20, 1945  Today's Date: 10/23/2016 SLP Individual Time: 0935-1001 SLP Individual Time Calculation (min): 26 min  Short Term Goals: Week 1: SLP Short Term Goal 1 (Week 1): Pt will complete complex problem solving tasks with supervision cues.  SLP Short Term Goal 2 (Week 1): Pt will demonstrate anticipatory awareness by listing 3 activities that are safe for her to participate in within home environment with supervision cues.  SLP Short Term Goal 3 (Week 1): Pt will utilize speech intelligibility strategies at the complex conversation level in moderately noisey environment to achieve >95% intelligibility with Mod I. SLP Short Term Goal 4 (Week 1): Pt will demonstrate selective attention in mildly distracting environment for ~ 45 minutes with supervision cues.   Skilled Therapeutic Interventions: Skilled ST services focused on cognitive goals. SLP facilitated semi-complex problem solving utilizing novel deduction reasoning task with initial explanation, pt demonstrated ability to complete task with supervision A verbal cues. Pt demonstrated speech intelligibility during conversation about task in moderately noisy environment ( tv on and door open) with 95% accuracy with supervision A verbal cues. Pt was left in room with call bell within reach. Recommend to continue ST services.     Function:  Eating Eating                 Cognition Comprehension Comprehension assist level: Follows complex conversation/direction with extra time/assistive device  Expression   Expression assist level: Expresses complex ideas: With extra time/assistive device  Social Interaction Social Interaction assist level: Interacts appropriately with others with medication or extra time (anti-anxiety, antidepressant).  Problem Solving Problem solving assist level: Solves complex problems:  Recognizes & self-corrects  Memory Memory assist level: More than reasonable amount of time    Pain Pain Assessment Pain Assessment: No/denies pain  Therapy/Group: Individual Therapy  Sarah [name] 10/23/2016, 10:10 AM

## 2016-10-23 NOTE — Progress Notes (Signed)
Occupational Therapy Session Note  Patient Details  Name: Sarah [name] MRN: [id number] Date of Birth: 1945-11-02  Session 1 Today's Date: 10/23/2016 OT Individual Time: 0805-0900 OT Individual Time Calculation (min): 55 min   Session 2 Today's Date: 10/23/2016 OT Individual Time: 1430-1501 OT Individual Time Calculation (min): 31 min   Short Term Goals: Week 1:  OT Short Term Goal 1 (Week 1): STG=LTG  Skilled Therapeutic Interventions/Progress Updates:    Session 1 OT treatment session focused on modified bathing/dressing and functional use of L UE. Pt ambulated in room to collect clothing and bathing supplies. Pt needed close supervision and intermittent min guard A for 3 LOB. Pt unable to maintain grasp on clothing requiring modified strategy to carry to bathroom. Pt bathed in shower sit<>stand with supervision and set-up A.  Dressing completed with overall min A for UB dressing and verbal cues to recall hemii techniques. Educated pt on one-handed technique to don sock, then used forced use of L hand to help pull up sock. Pt ambulated to tub room with close supervision/min guard A  with focus on L foot clearance. Pt practiced stepping over tub with min guard A and back wall grab bars. Functional use of L hand with cup stacking activity focused on grasp/release and normal movement patterns of shoulder/elbow/wrist. Pt returned to room in similar fashion and left seated in wc with needs met and call bell/phone in reach.    Session 2  OT treatment session focused on L NMR and NMES. Pt ambulated to therapy gym with min guard A. 1:1 NMES applied to flexor tendons. Incorporated active finger extension and weight bearing with breaks between activation.   Ratio 1:3 Rate 35 pps Waveform- Asymmetric Ramp 1.0 Pulse 300 Intensity- 17 Duration - 15    Report of pain at the beginning of session  Report of pain at the end of session  No adverse reactions after treatment and is skin  intact. Pt ambulated back to room at end of session and left seated in recliner with needs met.    Therapy Documentation Precautions:  Precautions Precautions: Fall Restrictions Weight Bearing Restrictions: No  See Function Navigator for Current Functional Status.   Therapy/Group: Individual Therapy  Valma Cava 10/23/2016, 8:23 AM

## 2016-10-24 LAB — GLUCOSE, CAPILLARY
Glucose-Capillary: 123 mg/dL — ABNORMAL HIGH (ref 65–99)
Glucose-Capillary: 121 mg/dL — ABNORMAL HIGH (ref 65–99)
Glucose-Capillary: 139 mg/dL — ABNORMAL HIGH (ref 65–99)
Glucose-Capillary: 162 mg/dL — ABNORMAL HIGH (ref 65–99)

## 2016-10-24 NOTE — Progress Notes (Signed)
10/24/16 1700 nursing Patient refuses metformin claims it is making her go to the bathroom a lot. RN educated but patient claims she had the same side effects when she was taking it at home.

## 2016-10-24 NOTE — Progress Notes (Signed)
Subjective/Complaints: Asking for grounds pass  ROS:  Denies CP, SOB, N/V/D, last BM 10/10  Objective: Vital Signs: Blood pressure 120/65, pulse 72, temperature 98 F (36.7 C), temperature source Oral, resp. rate 18, height 5' (1.524 m), weight 61 kg (134 lb 7.5 oz), SpO2 95 %. No results found. Results for orders placed or performed during the hospital encounter of 10/19/16 (from the past 72 hour(s))  Glucose, capillary     Status: Abnormal   Collection Time: 10/21/16 12:16 PM  Result Value Ref Range   Glucose-Capillary 103 (H) 65 - 99 mg/dL  Glucose, capillary     Status: None   Collection Time: 10/21/16  5:00 PM  Result Value Ref Range   Glucose-Capillary 87 65 - 99 mg/dL  Glucose, capillary     Status: Abnormal   Collection Time: 10/21/16  8:23 PM  Result Value Ref Range   Glucose-Capillary 147 (H) 65 - 99 mg/dL  Glucose, capillary     Status: Abnormal   Collection Time: 10/22/16  6:56 AM  Result Value Ref Range   Glucose-Capillary 140 (H) 65 - 99 mg/dL  Glucose, capillary     Status: Abnormal   Collection Time: 10/22/16 11:37 AM  Result Value Ref Range   Glucose-Capillary 145 (H) 65 - 99 mg/dL  Glucose, capillary     Status: Abnormal   Collection Time: 10/22/16  4:42 PM  Result Value Ref Range   Glucose-Capillary 134 (H) 65 - 99 mg/dL  Glucose, capillary     Status: Abnormal   Collection Time: 10/22/16  9:16 PM  Result Value Ref Range   Glucose-Capillary 132 (H) 65 - 99 mg/dL  Glucose, capillary     Status: Abnormal   Collection Time: 10/23/16  6:57 AM  Result Value Ref Range   Glucose-Capillary 127 (H) 65 - 99 mg/dL  Glucose, capillary     Status: Abnormal   Collection Time: 10/23/16  4:31 PM  Result Value Ref Range   Glucose-Capillary 146 (H) 65 - 99 mg/dL  Glucose, capillary     Status: Abnormal   Collection Time: 10/23/16  9:17 PM  Result Value Ref Range   Glucose-Capillary 149 (H) 65 - 99 mg/dL  Glucose, capillary     Status: Abnormal   Collection Time:  10/24/16  6:52 AM  Result Value Ref Range   Glucose-Capillary 123 (H) 65 - 99 mg/dL     HEENT: poor dentition Cardio: RRR and no murmur Resp: CTA B/L and unlabored GI: BS positive and NT, ND Extremity:  No Edema Skin:   Intact Neuro: Alert/Oriented, Normal Sensory, Abnormal Motor 3/5 Left delt, bi, tri, 2- finger flex and ext, 3/5 L HF, KE, ADF, Abnormal FMC Ataxic/ dec FMC and Dysarthric Musc/Skel:  Normal, no pain with UE or LE ROM Gen NAD   Assessment/Plan: 1. Functional deficits secondary to Left hemiparesis, R MCA infarct which require 3+ hours per day of interdisciplinary therapy in a comprehensive inpatient rehab setting. Physiatrist is providing close team supervision and 24 hour management of active medical problems listed below. Physiatrist and rehab team continue to assess barriers to discharge/monitor patient progress toward functional and medical goals. FIM: Function - Bathing Position: Shower Body parts bathed by patient: Left arm, Chest, Abdomen, Front perineal area, Buttocks, Right upper leg, Left upper leg, Right lower leg, Left lower leg Body parts bathed by helper: Right arm, Back Assist Level: Touching or steadying assistance(Pt > 75%)  Function- Upper Body Dressing/Undressing What is the patient wearing?: Pull over shirt/dress  Pull over shirt/dress - Perfomed by patient: Thread/unthread right sleeve, Put head through opening, Thread/unthread left sleeve, Pull shirt over trunk Function - Lower Body Dressing/Undressing What is the patient wearing?: Underwear, Pants, Socks, Shoes Position: Sitting EOB Underwear - Performed by patient: Thread/unthread right underwear leg, Thread/unthread left underwear leg Underwear - Performed by helper: Pull underwear up/down Pants- Performed by patient: Thread/unthread right pants leg, Thread/unthread left pants leg, Pull pants up/down Socks - Performed by patient: Don/doff right sock, Don/doff left sock Assist for lower body  dressing: Touching or steadying assistance (Pt > 75%)  Function - Toileting Toileting steps completed by patient: Adjust clothing prior to toileting, Performs perineal hygiene, Adjust clothing after toileting Toileting steps completed by helper: Adjust clothing after toileting Toileting Assistive Devices: Grab bar or rail Assist level: Touching or steadying assistance (Pt.75%)  Function - Toilet Transfers Assist level to toilet: Touching or steadying assistance (Pt > 75%) Assist level from toilet: Touching or steadying assistance (Pt > 75%)  Function - Chair/bed transfer Chair/bed transfer method: Ambulatory Chair/bed transfer assist level: Touching or steadying assistance (Pt > 75%) Chair/bed transfer assistive device: Armrests Chair/bed transfer details: Verbal cues for precautions/safety  Function - Locomotion: Wheelchair Will patient use wheelchair at discharge?: No Function - Locomotion: Ambulation Assistive device: No device Max distance: 100' Assist level: Touching or steadying assistance (Pt > 75%) Assist level: Touching or steadying assistance (Pt > 75%) Assist level: Touching or steadying assistance (Pt > 75%) Assist level: Touching or steadying assistance (Pt > 75%) Assist level: Touching or steadying assistance (Pt > 75%)  Function - Comprehension Comprehension: Auditory Comprehension assist level: Follows complex conversation/direction with extra time/assistive device  Function - Expression Expression: Verbal Expression assist level: Expresses complex ideas: With extra time/assistive device  Function - Social Interaction Social Interaction assist level: Interacts appropriately with others with medication or extra time (anti-anxiety, antidepressant).  Function - Problem Solving Problem solving assist level: Solves complex problems: Recognizes & self-corrects  Function - Memory Memory assist level: More than reasonable amount of time Patient normally able to  recall (first 3 days only): Current season, Location of own room, Staff names and faces, That he or she is in a hospital  Medical Problem List and Plan: 1.  Left-sided weakness secondary to right MCA infarction. Status post loop recorder             -CIR PT, OT,SLP  2.  DVT Prophylaxis/Anticoagulation: SCD.Monitor for any signs of DVT 3. Pain Management: Hydrocodone as needed 4. Mood: Provide emotional support 5. Neuropsych: This patient is capable of making decisions on her own behalf. 6. Skin/Wound Care: Routine skin checks 7. Fluids/Electrolytes/Nutrition: Routine I&O with follow up  BMET wnl except elevated glucose 8.Diabetes mellitus peripheral neuropathy.Hemoglobin A1c 12.9.Glucophage 1000 mg twice a day,NovoLog 10 units 3 times a day with meals, Lantus insulin 60 units daily. Check blood sugars before meals and at bedtime. Diabetic teaching Current meds are metformin 1000mg  BID, SSI only qhs,cont current meds 10/14 CBG (last 3)   Recent Labs  10/23/16 1631 10/23/16 2117 10/24/16 0652  GLUCAP 146* 149* 123*    9.Hypertension.No current antihypertensive medication. Patient on Norvasc 10 mg daily, Diovan-HCT 320-25 milligrams daily prior to admission. Cont off meds Vitals:   10/23/16 1424 10/24/16 0529  BP: 132/62 120/65  Pulse: 89 72  Resp: 14 18  Temp: 98.5 F (36.9 C) 98 F (36.7 C)  SpO2: 98% 95%   10. Hyperlipidemia. Lipitor 11. COPD/ Tobacco abuse. Counseling 12.Suspect dental abscess. Empiric  Augmentin 500 mg 3 times a day initiated 10/17/2016, may d/c 10/16   LOS (Days) 5 A FACE TO FACE EVALUATION WAS PERFORMED  Taran Haynesworth E 10/24/2016, 8:16 AM

## 2016-10-25 ENCOUNTER — Inpatient Hospital Stay (HOSPITAL_COMMUNITY): Payer: Medicare Other | Admitting: Speech Pathology

## 2016-10-25 ENCOUNTER — Inpatient Hospital Stay (HOSPITAL_COMMUNITY): Payer: Medicare Other | Admitting: Physical Therapy

## 2016-10-25 ENCOUNTER — Inpatient Hospital Stay (HOSPITAL_COMMUNITY): Payer: Medicare Other | Admitting: Occupational Therapy

## 2016-10-25 LAB — GLUCOSE, CAPILLARY
Glucose-Capillary: 131 mg/dL — ABNORMAL HIGH (ref 65–99)
Glucose-Capillary: 276 mg/dL — ABNORMAL HIGH (ref 65–99)
Glucose-Capillary: 240 mg/dL — ABNORMAL HIGH (ref 65–99)
Glucose-Capillary: 281 mg/dL — ABNORMAL HIGH (ref 65–99)

## 2016-10-25 MED ORDER — AMLODIPINE BESYLATE 5 MG PO TABS
5.0000 mg | ORAL_TABLET | Freq: Every day | ORAL | Status: DC
  Administered 2016-10-25: 5 mg via ORAL
  Filled 2016-10-25: qty 1

## 2016-10-25 NOTE — Progress Notes (Signed)
Physical Therapy Session Note  Patient Details  Name: Sarah [name] MRN: [id number] Date of Birth: May 01, 1945  Today's Date: 10/25/2016 PT Individual Time: 1045-1200 PT Individual Time Calculation (min): 75 min   Short Term Goals: Week 1:  PT Short Term Goal 1 (Week 1): STG = LTG due to LOS  Skilled Therapeutic Interventions/Progress Updates:    no c/o pain.  Session focus on dynamic sitting/standing balance for ADLs, gait training, activity tolerance, and path finding.   Pt completes bathing at shower level with overall supervision, min cues for safety with sit<>stand and dynamic sitting/standing balance.  Dressing from w/c level with supervision for sit<>stand and dynamic standing balance for clothing management.    Pt ambulates off unit with rollator and supervision, manages doorways, elevators, and uneven surfaces.  While seated outside pt engaged in therapeutic conversation regarding d/c planning and f/u. Return to rehab unit with min cues for path finding.    Fine motor task in standing on foam for ankle strategy, activity tolerance, and visual scanning.  Transition to fine motor task with foam blocks in sitting.    Pt ambulates back to room at end of session and positioned in recliner with call bell in reach and needs met.   Therapy Documentation Precautions:  Precautions Precautions: Fall Restrictions Weight Bearing Restrictions: No   See Function Navigator for Current Functional Status.   Therapy/Group: Individual Therapy  Michel Santee 10/25/2016, 2:07 PM

## 2016-10-25 NOTE — Progress Notes (Signed)
Occupational Therapy Session Note  Patient Details  Name: Sarah [name] MRN: [id number] Date of Birth: Jun 03, 1945  Today's Date: 10/25/2016 OT Individual Time: 4128-7867 OT Individual Time Calculation (min): 75 min   Short Term Goals: Week 1:  OT Short Term Goal 1 (Week 1): STG=LTG  Skilled Therapeutic Interventions/Progress Updates:    OT treatment session focused on activity tolerance, functional use of L UE, L NMR, and NMES. Pt ambulated with rollator walker to elevators with supervision and min cues to maintain grasp with L hand onto rollator. Educated on Teaching laboratory technician within community environment. Pt able to push elevator buttons using thumb, then hand over hand A to achieve finger isolation to push buttons on next opportunity. L NMR focused on pinch and grip strength using tan soft theraputty. 1:1 NMES applied to facilitated finger flexion. Incorporated therapeutic activity of grasp-release of different sized theraputty balls. With repetition, pt with improved grasp-release, of large to medium/small balls.  Ratio 1:3 Rate 35 pps Waveform- Asymmetric Ramp 1.0 Pulse 300 Intensity- 17 Duration - 15 No adverse reactions after treatment and is skin intact.    Therapy Documentation Precautions:  Precautions Precautions: Fall Restrictions Weight Bearing Restrictions: No Pain:   ADL: ADL ADL Comments: see functional navigator  See Function Navigator for Current Functional Status.   Therapy/Group: Individual Therapy  Valma Cava 10/25/2016, 3:38 PM

## 2016-10-25 NOTE — Progress Notes (Signed)
Speech Language Pathology Daily Session Note  Patient Details  Name: EUFELIA VENO MRN: [id number] Date of Birth: 1945-07-28  Today's Date: 10/25/2016 SLP Individual Time: 1330-1415 SLP Individual Time Calculation (min): 45 min  Short Term Goals: Week 1: SLP Short Term Goal 1 (Week 1): Pt will complete complex problem solving tasks with supervision cues.  SLP Short Term Goal 2 (Week 1): Pt will demonstrate anticipatory awareness by listing 3 activities that are safe for her to participate in within home environment with supervision cues.  SLP Short Term Goal 3 (Week 1): Pt will utilize speech intelligibility strategies at the complex conversation level in moderately noisey environment to achieve >95% intelligibility with Mod I. SLP Short Term Goal 4 (Week 1): Pt will demonstrate selective attention in mildly distracting environment for ~ 45 minutes with supervision cues.   Skilled Therapeutic Interventions: Skilled treatment session focused on  Cognition goals. SLP facilitated session by providing supervision cues for completion of simple deductive reasoning puzzle while demonstrating selective attention in moderately distracting environment. Pt able to list 2 activities that she would be safe performing within the home environment with supervision cues. Pt was returned to room, transferred back to recliner and left with all needs within reach. Continue per current plan of care.      Function:    Cognition Comprehension Comprehension assist level: Follows complex conversation/direction with extra time/assistive device;Understands complex 90% of the time/cues 10% of the time  Expression   Expression assist level: Expresses complex ideas: With extra time/assistive device  Social Interaction Social Interaction assist level: Interacts appropriately 90% of the time - Needs monitoring or encouragement for participation or interaction.  Problem Solving Problem solving assist level: Solves  complex 90% of the time/cues < 10% of the time;Solves complex problems: With extra time  Memory Memory assist level: More than reasonable amount of time    Pain    Therapy/Group: Individual Therapy  Agustina Witzke 10/25/2016, 3:14 PM

## 2016-10-25 NOTE — Progress Notes (Signed)
Subjective/Complaints:  No issues overnite, pt asking about D/C date  ROS:  Denies CP, SOB, N/V/D, last BM 10/10  Objective: Vital Signs: Blood pressure (!) 144/54, pulse 72, temperature 98.6 F (37 C), temperature source Oral, resp. rate 18, height 5' (1.524 m), weight 61 kg (134 lb 7.5 oz), SpO2 97 %. No results found. Results for orders placed or performed during the hospital encounter of 10/19/16 (from the past 72 hour(s))  Glucose, capillary     Status: Abnormal   Collection Time: 10/22/16 11:37 AM  Result Value Ref Range   Glucose-Capillary 145 (H) 65 - 99 mg/dL  Glucose, capillary     Status: Abnormal   Collection Time: 10/22/16  4:42 PM  Result Value Ref Range   Glucose-Capillary 134 (H) 65 - 99 mg/dL  Glucose, capillary     Status: Abnormal   Collection Time: 10/22/16  9:16 PM  Result Value Ref Range   Glucose-Capillary 132 (H) 65 - 99 mg/dL  Glucose, capillary     Status: Abnormal   Collection Time: 10/23/16  6:57 AM  Result Value Ref Range   Glucose-Capillary 127 (H) 65 - 99 mg/dL  Glucose, capillary     Status: Abnormal   Collection Time: 10/23/16  4:31 PM  Result Value Ref Range   Glucose-Capillary 146 (H) 65 - 99 mg/dL  Glucose, capillary     Status: Abnormal   Collection Time: 10/23/16  9:17 PM  Result Value Ref Range   Glucose-Capillary 149 (H) 65 - 99 mg/dL  Glucose, capillary     Status: Abnormal   Collection Time: 10/24/16  6:52 AM  Result Value Ref Range   Glucose-Capillary 123 (H) 65 - 99 mg/dL  Glucose, capillary     Status: Abnormal   Collection Time: 10/24/16 11:28 AM  Result Value Ref Range   Glucose-Capillary 121 (H) 65 - 99 mg/dL  Glucose, capillary     Status: Abnormal   Collection Time: 10/24/16  4:43 PM  Result Value Ref Range   Glucose-Capillary 139 (H) 65 - 99 mg/dL  Glucose, capillary     Status: Abnormal   Collection Time: 10/24/16  9:04 PM  Result Value Ref Range   Glucose-Capillary 162 (H) 65 - 99 mg/dL  Glucose, capillary      Status: Abnormal   Collection Time: 10/25/16  6:13 AM  Result Value Ref Range   Glucose-Capillary 131 (H) 65 - 99 mg/dL     HEENT: poor dentition Cardio: RRR and no murmur Resp: CTA B/L and unlabored GI: BS positive and NT, ND Extremity:  No Edema Skin:   Intact Neuro: Alert/Oriented, Normal Sensory, Abnormal Motor 3/5 Left delt, bi, tri, 2- finger flex and ext, 3/5 L HF, KE, ADF, Abnormal FMC Ataxic/ dec FMC and Dysarthric Musc/Skel:  Normal, no pain with UE or LE ROM Gen NAD   Assessment/Plan: 1. Functional deficits secondary to Left hemiparesis, R MCA infarct which require 3+ hours per day of interdisciplinary therapy in a comprehensive inpatient rehab setting. Physiatrist is providing close team supervision and 24 hour management of active medical problems listed below. Physiatrist and rehab team continue to assess barriers to discharge/monitor patient progress toward functional and medical goals. FIM: Function - Bathing Position: Shower Body parts bathed by patient: Left arm, Chest, Abdomen, Front perineal area, Buttocks, Right upper leg, Left upper leg, Right lower leg, Left lower leg Body parts bathed by helper: Right arm, Back Assist Level: Touching or steadying assistance(Pt > 75%)  Function- Upper Body Dressing/Undressing What  is the patient wearing?: Pull over shirt/dress Pull over shirt/dress - Perfomed by patient: Thread/unthread right sleeve, Put head through opening, Thread/unthread left sleeve, Pull shirt over trunk Function - Lower Body Dressing/Undressing What is the patient wearing?: Underwear, Pants, Socks, Shoes Position: Sitting EOB Underwear - Performed by patient: Thread/unthread right underwear leg, Thread/unthread left underwear leg Underwear - Performed by helper: Pull underwear up/down Pants- Performed by patient: Thread/unthread right pants leg, Thread/unthread left pants leg, Pull pants up/down Socks - Performed by patient: Don/doff right sock,  Don/doff left sock Assist for lower body dressing: Touching or steadying assistance (Pt > 75%)  Function - Toileting Toileting steps completed by patient: Adjust clothing prior to toileting, Performs perineal hygiene, Adjust clothing after toileting Toileting steps completed by helper: Adjust clothing after toileting Toileting Assistive Devices: Grab bar or rail Assist level: Touching or steadying assistance (Pt.75%)  Function - Toilet Transfers Assist level to toilet: Touching or steadying assistance (Pt > 75%) Assist level from toilet: Touching or steadying assistance (Pt > 75%)  Function - Chair/bed transfer Chair/bed transfer method: Ambulatory Chair/bed transfer assist level: Touching or steadying assistance (Pt > 75%) Chair/bed transfer assistive device: Armrests Chair/bed transfer details: Verbal cues for precautions/safety  Function - Locomotion: Wheelchair Will patient use wheelchair at discharge?: No Function - Locomotion: Ambulation Assistive device: No device Max distance: 100' Assist level: Touching or steadying assistance (Pt > 75%) Assist level: Touching or steadying assistance (Pt > 75%) Assist level: Touching or steadying assistance (Pt > 75%) Assist level: Touching or steadying assistance (Pt > 75%) Assist level: Touching or steadying assistance (Pt > 75%)  Function - Comprehension Comprehension: Auditory Comprehension assist level: Follows complex conversation/direction with extra time/assistive device  Function - Expression Expression: Verbal Expression assist level: Expresses complex ideas: With extra time/assistive device  Function - Social Interaction Social Interaction assist level: Interacts appropriately with others - No medications needed.  Function - Problem Solving Problem solving assist level: Solves complex problems: Recognizes & self-corrects  Function - Memory Memory assist level: More than reasonable amount of time Patient normally able  to recall (first 3 days only): Current season, Location of own room, Staff names and faces, That he or she is in a hospital  Medical Problem List and Plan: 1.  Left-sided weakness secondary to right MCA infarction. Status post loop recorder             -CIR PT, OT,SLP  2.  DVT Prophylaxis/Anticoagulation: SCD.Monitor for any signs of DVT 3. Pain Management: Hydrocodone as needed 4. Mood: Provide emotional support 5. Neuropsych: This patient is capable of making decisions on her own behalf. 6. Skin/Wound Care: Routine skin checks 7. Fluids/Electrolytes/Nutrition: Routine I&O with follow up  BMET wnl except elevated glucose 8.Diabetes mellitus peripheral neuropathy.Hemoglobin A1c 12.9.Glucophage 1000 mg twice a day,NovoLog 10 units 3 times a day with meals, Lantus insulin 60 units daily. Check blood sugars before meals and at bedtime. Diabetic teaching Current meds are metformin 1000mg  BID, SSI only qhs,cont current meds 10/15 CBG (last 3)   Recent Labs  10/24/16 1643 10/24/16 2104 10/25/16 0613  GLUCAP 139* 162* 131*    9.Hypertension.No current antihypertensive medication. Patient on Norvasc 10 mg daily, Diovan-HCT 320-25 milligrams daily prior to admission. Will restart low dose amlodipine Vitals:   10/24/16 1342 10/25/16 0533  BP: (!) 142/64 (!) 144/54  Pulse: (!) 103 72  Resp: 18 18  Temp: (!) 97.5 F (36.4 C) 98.6 F (37 C)  SpO2: 98% 97%   10. Hyperlipidemia.  Lipitor 11. COPD/ Tobacco abuse. Counseling 12.Suspect dental abscess. Empiric Augmentin 500 mg 3 times a day initiated 10/17/2016, may d/c 10/16   LOS (Days) 6 A FACE TO FACE EVALUATION WAS PERFORMED  Roscoe Witts E 10/25/2016, 7:10 AM

## 2016-10-26 ENCOUNTER — Inpatient Hospital Stay (HOSPITAL_COMMUNITY): Payer: Self-pay | Admitting: Physical Therapy

## 2016-10-26 ENCOUNTER — Inpatient Hospital Stay (HOSPITAL_COMMUNITY): Payer: Medicare Other | Admitting: Occupational Therapy

## 2016-10-26 ENCOUNTER — Inpatient Hospital Stay (HOSPITAL_COMMUNITY): Payer: Medicare Other | Admitting: Speech Pathology

## 2016-10-26 LAB — GLUCOSE, CAPILLARY
Glucose-Capillary: 180 mg/dL — ABNORMAL HIGH (ref 65–99)
Glucose-Capillary: 277 mg/dL — ABNORMAL HIGH (ref 65–99)
Glucose-Capillary: 258 mg/dL — ABNORMAL HIGH (ref 65–99)
Glucose-Capillary: 315 mg/dL — ABNORMAL HIGH (ref 65–99)

## 2016-10-26 MED ORDER — AMLODIPINE BESYLATE 2.5 MG PO TABS
2.5000 mg | ORAL_TABLET | Freq: Every day | ORAL | Status: DC
  Administered 2016-10-26 – 2016-10-28 (×3): 2.5 mg via ORAL
  Filled 2016-10-26 (×3): qty 1

## 2016-10-26 MED ORDER — INSULIN GLARGINE 100 UNIT/ML ~~LOC~~ SOLN
10.0000 [IU] | Freq: Every day | SUBCUTANEOUS | Status: DC
  Administered 2016-10-26: 10 [IU] via SUBCUTANEOUS
  Filled 2016-10-26: qty 0.1

## 2016-10-26 NOTE — Progress Notes (Signed)
Occupational Therapy Session Note  Patient Details  Name: Sarah [name] MRN: [id number] Date of Birth: 08/05/1945  Today's Date: 10/26/2016 OT Individual Time: 1345-1445 OT Individual Time Calculation (min): 60 min    Short Term Goals: Week 1:  OT Short Term Goal 1 (Week 1): STG=LTG  Skilled Therapeutic Interventions/Progress Updates:    OT treatment session focused on modified bathing/dressing, L NMR, and NMES. Pt ambulated in room to collect clothing with supervision and 1 near posterior LOB requiring min guard A to correct. Bathing completed with overall set-up A with focus on forced use of L UE to wash legs and abdomen. Dressing completed with increased time with continued integration of forced [place] to pull up socks, pants, and pull down shirt. 1:1 NMES applied to flexor tendons to elicit full finger flexion. Pt with improved thumb opposition to assist with pinch and release of graded foam cubes. Pt ambulated back to room at end of session with added challenge of maintaining grasp on cup.  Ratio 1:1 Rate 35 pps Waveform- Asymmetric Ramp 1.0 Pulse 300 Intensity- 17 Duration -   10  No adverse reactions after treatment and is skin intact.    Therapy Documentation Precautions:  Precautions Precautions: Fall Restrictions Weight Bearing Restrictions: No Pain:  none/denies pain ADL: ADL ADL Comments: see functional navigator  See Function Navigator for Current Functional Status.   Therapy/Group: Individual Therapy  Valma Cava 10/26/2016, 2:05 PM

## 2016-10-26 NOTE — Progress Notes (Signed)
Physical Therapy Session Note  Patient Details  Name: Sarah [name] MRN: [id number] Date of Birth: 21-Aug-1945  Today's Date: 10/26/2016 PT Individual Time: 1500-1525 PT Individual Time Calculation (min): 25 min   Short Term Goals: Week 1:  PT Short Term Goal 1 (Week 1): STG = LTG due to LOS  Skilled Therapeutic Interventions/Progress Updates:    no c/o pain throughout session; session focused on standing activity tolerance and balance.   Pt amb 150' from pt room to gym with supervision. Pt performed Wii bowling with toes on low edge of foam wedge to work on hip strategy and activity tolerance in standing with score of 145. Pt performed second round of Wii bowling with wedge turned with low edge on pt's L with supervision to work on correcting posture to avoid L lean and promote weight shifting laterally as appropriate with score of 176. Pt experienced one LOB during second bout of Wii bowling but was able to correct with min A from PT to provide support. Pt amb 150' back to pt room, left upright in recliner with call bell within reach and all needs addressed.   Therapy Documentation Precautions:  Precautions Precautions: Fall Restrictions Weight Bearing Restrictions: No  See Function Navigator for Current Functional Status.   Therapy/Group: Individual Therapy  [name] 10/26/2016, 3:43 PM

## 2016-10-26 NOTE — Progress Notes (Signed)
Subjective/Complaints:  No issues overnite, per RN diarrhea with metformin, pt is refusing  ROS:  Denies CP, SOB, N/V/D, last BM 10/10  Objective: Vital Signs: Blood pressure (!) 106/51, pulse 67, temperature 98.9 F (37.2 C), temperature source Oral, resp. rate 18, height 5' (1.524 m), weight 61 kg (134 lb 7.5 oz), SpO2 97 %. No results found. Results for orders placed or performed during the hospital encounter of 10/19/16 (from the past 72 hour(s))  Glucose, capillary     Status: Abnormal   Collection Time: 10/23/16  4:31 PM  Result Value Ref Range   Glucose-Capillary 146 (H) 65 - 99 mg/dL  Glucose, capillary     Status: Abnormal   Collection Time: 10/23/16  9:17 PM  Result Value Ref Range   Glucose-Capillary 149 (H) 65 - 99 mg/dL  Glucose, capillary     Status: Abnormal   Collection Time: 10/24/16  6:52 AM  Result Value Ref Range   Glucose-Capillary 123 (H) 65 - 99 mg/dL  Glucose, capillary     Status: Abnormal   Collection Time: 10/24/16 11:28 AM  Result Value Ref Range   Glucose-Capillary 121 (H) 65 - 99 mg/dL  Glucose, capillary     Status: Abnormal   Collection Time: 10/24/16  4:43 PM  Result Value Ref Range   Glucose-Capillary 139 (H) 65 - 99 mg/dL  Glucose, capillary     Status: Abnormal   Collection Time: 10/24/16  9:04 PM  Result Value Ref Range   Glucose-Capillary 162 (H) 65 - 99 mg/dL  Glucose, capillary     Status: Abnormal   Collection Time: 10/25/16  6:13 AM  Result Value Ref Range   Glucose-Capillary 131 (H) 65 - 99 mg/dL  Glucose, capillary     Status: Abnormal   Collection Time: 10/25/16  1:21 PM  Result Value Ref Range   Glucose-Capillary 276 (H) 65 - 99 mg/dL  Glucose, capillary     Status: Abnormal   Collection Time: 10/25/16  4:30 PM  Result Value Ref Range   Glucose-Capillary 240 (H) 65 - 99 mg/dL  Glucose, capillary     Status: Abnormal   Collection Time: 10/25/16  9:33 PM  Result Value Ref Range   Glucose-Capillary 281 (H) 65 - 99 mg/dL   Glucose, capillary     Status: Abnormal   Collection Time: 10/26/16  6:45 AM  Result Value Ref Range   Glucose-Capillary 180 (H) 65 - 99 mg/dL     HEENT: poor dentition Cardio: RRR and no murmur Resp: CTA B/L and unlabored GI: BS positive and NT, ND Extremity:  No Edema Skin:   Intact Neuro: Alert/Oriented, Normal Sensory, Abnormal Motor 3/5 Left delt, bi, tri, 2- finger flex and ext, 3/5 L HF, KE, ADF, Abnormal FMC Ataxic/ dec FMC and Dysarthric Musc/Skel:  Normal, no pain with UE or LE ROM Gen NAD   Assessment/Plan: 1. Functional deficits secondary to Left hemiparesis, R MCA infarct which require 3+ hours per day of interdisciplinary therapy in a comprehensive inpatient rehab setting. Physiatrist is providing close team supervision and 24 hour management of active medical problems listed below. Physiatrist and rehab team continue to assess barriers to discharge/monitor patient progress toward functional and medical goals. FIM: Function - Bathing Position: Shower Body parts bathed by patient: Left arm, Chest, Abdomen, Front perineal area, Buttocks, Right upper leg, Left upper leg, Right lower leg, Left lower leg, Right arm Body parts bathed by helper: Right arm, Back Bathing not applicable: Back Assist Level: Supervision or  verbal cues  Function- Upper Body Dressing/Undressing What is the patient wearing?: Pull over shirt/dress Pull over shirt/dress - Perfomed by patient: Thread/unthread right sleeve, Put head through opening, Thread/unthread left sleeve, Pull shirt over trunk Assist Level: More than reasonable time Function - Lower Body Dressing/Undressing What is the patient wearing?: Shoes, Underwear, Pants Position: Wheelchair/chair at sink Underwear - Performed by patient: Thread/unthread right underwear leg, Thread/unthread left underwear leg Underwear - Performed by helper: Pull underwear up/down Pants- Performed by patient: Thread/unthread right pants leg,  Thread/unthread left pants leg, Pull pants up/down Socks - Performed by patient: Don/doff right sock, Don/doff left sock Shoes - Performed by patient: Don/doff right shoe, Don/doff left shoe Assist for footwear: Supervision/touching assist Assist for lower body dressing: Supervision or verbal cues  Function - Toileting Toileting steps completed by patient: Adjust clothing prior to toileting, Performs perineal hygiene, Adjust clothing after toileting Toileting steps completed by helper: Adjust clothing after toileting Toileting Assistive Devices: Grab bar or rail Assist level: Touching or steadying assistance (Pt.75%)  Function - Toilet Transfers Assist level to toilet: Touching or steadying assistance (Pt > 75%) Assist level from toilet: Touching or steadying assistance (Pt > 75%)  Function - Chair/bed transfer Chair/bed transfer method: Ambulatory Chair/bed transfer assist level: Supervision or verbal cues Chair/bed transfer assistive device: Armrests, Environmental consultant (rollator) Chair/bed transfer details: Verbal cues for precautions/safety  Function - Locomotion: Wheelchair Will patient use wheelchair at discharge?: No Function - Locomotion: Ambulation Assistive device: Radio producer (rollator) Max distance: 500 Assist level: Supervision or verbal cues Assist level: Supervision or verbal cues Assist level: Supervision or verbal cues Assist level: Supervision or verbal cues Assist level: Supervision or verbal cues  Function - Comprehension Comprehension: Auditory Comprehension assist level: Follows complex conversation/direction with extra time/assistive device, Understands complex 90% of the time/cues 10% of the time  Function - Expression Expression: Verbal Expression assist level: Expresses complex ideas: With extra time/assistive device  Function - Social Interaction Social Interaction assist level: Interacts appropriately with others - No medications needed.  Function -  Problem Solving Problem solving assist level: Solves complex problems: Recognizes & self-corrects  Function - Memory Memory assist level: More than reasonable amount of time Patient normally able to recall (first 3 days only): Current season, Location of own room, Staff names and faces, That he or she is in a hospital  Medical Problem List and Plan: 1.  Left-sided weakness secondary to right MCA infarction. Status post loop recorder             -CIR PT, OT,SLP team conf in am 2.  DVT Prophylaxis/Anticoagulation: SCD.Monitor for any signs of DVT 3. Pain Management: Hydrocodone as needed 4. Mood: Provide emotional support 5. Neuropsych: This patient is capable of making decisions on her own behalf. 6. Skin/Wound Care: Routine skin checks 7. Fluids/Electrolytes/Nutrition: Routine I&O with follow up  BMET wnl except elevated glucose 8.Diabetes mellitus peripheral neuropathy.Hemoglobin A1c 12.9.Glucophage 1000 mg twice a day,NovoLog 10 units 3 times a day with meals, Lantus insulin 60 units daily. Check blood sugars before meals and at bedtime. Diabetic teaching Current meds are metformin 1000mg  BID does not tolerate due to diarrhea, SSI only qhs,cont current meds 10/15 CBG (last 3)   Recent Labs  10/25/16 1630 10/25/16 2133 10/26/16 0645  GLUCAP 240* 281* 180*    9.Hypertension.No current antihypertensive medication. Patient on Norvasc 10 mg daily, Diovan-HCT 320-25 milligrams daily prior to admission. Will restart low dose amlodipine, started 5mg , will reduce to 2.5mg  Vitals:   10/25/16 1400  10/26/16 0546  BP: (!) 142/62 (!) 106/51  Pulse: 70 67  Resp: 18 18  Temp: 98.2 F (36.8 C) 98.9 F (37.2 C)  SpO2: 98% 97%   10. Hyperlipidemia. Lipitor 11. COPD/ Tobacco abuse. Counseling 12.Suspect dental abscess. Empiric Augmentin 500 mg 3 times a day initiated 10/17/2016, may d/c 10/16   LOS (Days) 7 A FACE TO FACE EVALUATION WAS PERFORMED  Ceasia Elwell E 10/26/2016, 7:00 AM

## 2016-10-26 NOTE — Progress Notes (Signed)
Physical Therapy Session Note  Patient Details  Name: Sarah [name] MRN: [id number] Date of Birth: 1945-04-05  Today's Date: 10/26/2016 PT Individual Time: 1100-1200 PT Individual Time Calculation (min): 60 min   Short Term Goals: Week 1:  PT Short Term Goal 1 (Week 1): STG = LTG due to LOS  Skilled Therapeutic Interventions/Progress Updates:    no c/o pain throughout session; session focused on balance training and activity tolerance.   Pt performed supine > sit with supervision. Pt performed lower body dressing seated in w/c with supervision and v/c for safety. Pt performed ambulatory transfers with supervision to min guard. Pt brushed teeth standing at sink with supervision. Pt amb 300' down to dayroom to perform Biodex. Pt performed Biodex Limits of Stability training x 3 rounds: 0:55 sec, 54%, static/firm surface, 0:54, 55%, 11 for surface but firm, 0:55, 52%, 11 for surface but firm. Pt required rest break between the first and second trial only. Pt reported needing to use the restroom. Pt performed toileting 3/3 with supervision. Pt amb 150' back to gym. Pt performed card matching activity standing on AirEx pad, 18 cards per bout of standing, 2 bouts total. Pt required rest breaks between the 2 rounds of standing and prior to walking back to pt room. Pt amb back to pt room with min guard due to fatigue. Pt positioned upright in recliner with all needs met, waiting for lunch.   Therapy Documentation Precautions:  Precautions Precautions: Fall Restrictions Weight Bearing Restrictions: No  See Function Navigator for Current Functional Status.   Therapy/Group: Individual Therapy  [name] 10/26/2016, 12:48 PM

## 2016-10-26 NOTE — Progress Notes (Signed)
Speech Language Pathology Daily Session Note  Patient Details  Name: Sarah [name] MRN: [id number] Date of Birth: 06-13-1945  Today's Date: 10/26/2016 SLP Individual Time: 7517-0017 SLP Individual Time Calculation (min): 55 min  Short Term Goals: Week 1: SLP Short Term Goal 1 (Week 1): Pt will complete complex problem solving tasks with supervision cues.  SLP Short Term Goal 2 (Week 1): Pt will demonstrate anticipatory awareness by listing 3 activities that are safe for her to participate in within home environment with supervision cues.  SLP Short Term Goal 3 (Week 1): Pt will utilize speech intelligibility strategies at the complex conversation level in moderately noisey environment to achieve >95% intelligibility with Mod I. SLP Short Term Goal 4 (Week 1): Pt will demonstrate selective attention in mildly distracting environment for ~ 45 minutes with supervision cues.   Skilled Therapeutic Interventions: Skilled treatment session focused on cognitive goals. SLP facilitated session by providing supervision verbal cues for functional problem solving during a complex medication management task of organizing a pill box and was Mod I for recall of her current medications. Patient also required more than a reasonable amount of time and Min A verbal cues for navigation/problem solving in regards to money management while utilizing the Internet. Patient left upright in bed with alarm on and all needs within reach. Continue with current plan of care.      Function:  Cognition Comprehension Comprehension assist level: Follows complex conversation/direction with extra time/assistive device;Understands complex 90% of the time/cues 10% of the time  Expression   Expression assist level: Expresses complex ideas: With extra time/assistive device  Social Interaction Social Interaction assist level: Interacts appropriately with others - No medications needed.  Problem Solving Problem solving assist  level: Solves complex 90% of the time/cues < 10% of the time  Memory Memory assist level: More than reasonable amount of time    Pain No/Denies Pain   Therapy/Group: Individual Therapy  Sarah [name] 10/26/2016, 2:14 PM

## 2016-10-27 ENCOUNTER — Inpatient Hospital Stay (HOSPITAL_COMMUNITY): Payer: Medicare Other | Admitting: Occupational Therapy

## 2016-10-27 ENCOUNTER — Inpatient Hospital Stay (HOSPITAL_COMMUNITY): Payer: Medicare Other | Admitting: Speech Pathology

## 2016-10-27 ENCOUNTER — Inpatient Hospital Stay (HOSPITAL_COMMUNITY): Payer: Self-pay | Admitting: Physical Therapy

## 2016-10-27 LAB — GLUCOSE, CAPILLARY
Glucose-Capillary: 164 mg/dL — ABNORMAL HIGH (ref 65–99)
Glucose-Capillary: 237 mg/dL — ABNORMAL HIGH (ref 65–99)
Glucose-Capillary: 315 mg/dL — ABNORMAL HIGH (ref 65–99)
Glucose-Capillary: 311 mg/dL — ABNORMAL HIGH (ref 65–99)

## 2016-10-27 MED ORDER — ATORVASTATIN CALCIUM 40 MG PO TABS: 40.0000 mg | ORAL_TABLET | Freq: Every day | ORAL | 0 refills | Status: DC

## 2016-10-27 MED ORDER — ASPIRIN 325 MG PO TBEC: 325.0000 mg | DELAYED_RELEASE_TABLET | Freq: Every day | ORAL | 0 refills | Status: DC

## 2016-10-27 MED ORDER — INSULIN GLARGINE 100 UNIT/ML ~~LOC~~ SOLN
10.0000 [IU] | Freq: Two times a day (BID) | SUBCUTANEOUS | Status: DC
  Administered 2016-10-27 – 2016-10-28 (×3): 10 [IU] via SUBCUTANEOUS
  Filled 2016-10-27 (×7): qty 0.1

## 2016-10-27 MED ORDER — AMLODIPINE BESYLATE 2.5 MG PO TABS: 2.5000 mg | ORAL_TABLET | Freq: Every day | ORAL | 1 refills | Status: DC

## 2016-10-27 MED ORDER — HYDROCODONE-ACETAMINOPHEN 5-325 MG PO TABS: 1.0000 | ORAL_TABLET | Freq: Four times a day (QID) | ORAL | 0 refills | Status: DC | PRN

## 2016-10-27 MED ORDER — CLOPIDOGREL BISULFATE 75 MG PO TABS: 75.0000 mg | ORAL_TABLET | Freq: Every day | ORAL | 0 refills | Status: DC

## 2016-10-27 NOTE — Progress Notes (Signed)
Subjective/Complaints:  Pt is working with SLP on memory this am  ROS:  Denies CP, SOB, N/V/D, last BM 10/10  Objective: Vital Signs: Blood pressure 127/60, pulse 79, temperature 98.2 F (36.8 C), temperature source Oral, resp. rate 18, height 5' (1.524 m), weight 61 kg (134 lb 7.5 oz), SpO2 100 %. No results found. Results for orders placed or performed during the hospital encounter of 10/19/16 (from the past 72 hour(s))  Glucose, capillary     Status: Abnormal   Collection Time: 10/24/16 11:28 AM  Result Value Ref Range   Glucose-Capillary 121 (H) 65 - 99 mg/dL  Glucose, capillary     Status: Abnormal   Collection Time: 10/24/16  4:43 PM  Result Value Ref Range   Glucose-Capillary 139 (H) 65 - 99 mg/dL  Glucose, capillary     Status: Abnormal   Collection Time: 10/24/16  9:04 PM  Result Value Ref Range   Glucose-Capillary 162 (H) 65 - 99 mg/dL  Glucose, capillary     Status: Abnormal   Collection Time: 10/25/16  6:13 AM  Result Value Ref Range   Glucose-Capillary 131 (H) 65 - 99 mg/dL  Glucose, capillary     Status: Abnormal   Collection Time: 10/25/16  1:21 PM  Result Value Ref Range   Glucose-Capillary 276 (H) 65 - 99 mg/dL  Glucose, capillary     Status: Abnormal   Collection Time: 10/25/16  4:30 PM  Result Value Ref Range   Glucose-Capillary 240 (H) 65 - 99 mg/dL  Glucose, capillary     Status: Abnormal   Collection Time: 10/25/16  9:33 PM  Result Value Ref Range   Glucose-Capillary 281 (H) 65 - 99 mg/dL  Glucose, capillary     Status: Abnormal   Collection Time: 10/26/16  6:45 AM  Result Value Ref Range   Glucose-Capillary 180 (H) 65 - 99 mg/dL  Glucose, capillary     Status: Abnormal   Collection Time: 10/26/16 11:51 AM  Result Value Ref Range   Glucose-Capillary 277 (H) 65 - 99 mg/dL  Glucose, capillary     Status: Abnormal   Collection Time: 10/26/16  4:45 PM  Result Value Ref Range   Glucose-Capillary 258 (H) 65 - 99 mg/dL  Glucose, capillary      Status: Abnormal   Collection Time: 10/26/16  8:39 PM  Result Value Ref Range   Glucose-Capillary 315 (H) 65 - 99 mg/dL  Glucose, capillary     Status: Abnormal   Collection Time: 10/27/16  6:53 AM  Result Value Ref Range   Glucose-Capillary 164 (H) 65 - 99 mg/dL     HEENT: poor dentition Cardio: RRR and no murmur Resp: CTA B/L and unlabored GI: BS positive and NT, ND Extremity:  No Edema Skin:   Intact Neuro: Alert/Oriented, Normal Sensory, Abnormal Motor 3/5 Left delt, bi, tri, 2- finger flex and ext, 3/5 L HF, KE, ADF, Abnormal FMC Ataxic/ dec FMC and Dysarthric Musc/Skel:  Normal, no pain with UE or LE ROM Gen NAD   Assessment/Plan: 1. Functional deficits secondary to Left hemiparesis, R MCA infarct which require 3+ hours per day of interdisciplinary therapy in a comprehensive inpatient rehab setting. Physiatrist is providing close team supervision and 24 hour management of active medical problems listed below. Physiatrist and rehab team continue to assess barriers to discharge/monitor patient progress toward functional and medical goals. FIM: Function - Bathing Position: Shower Body parts bathed by patient: Right arm, Left arm, Chest, Abdomen, Front perineal area, Buttocks, Right  upper leg, Left upper leg, Right lower leg, Left lower leg Body parts bathed by helper: Right arm, Back Bathing not applicable: Back Assist Level: More than reasonable time, Set up Set up : To obtain items  Function- Upper Body Dressing/Undressing What is the patient wearing?: Pull over shirt/dress Pull over shirt/dress - Perfomed by patient: Thread/unthread right sleeve, Thread/unthread left sleeve, Put head through opening, Pull shirt over trunk Assist Level: More than reasonable time Function - Lower Body Dressing/Undressing What is the patient wearing?: Underwear, Pants, Socks, Shoes Position: Sitting EOB Underwear - Performed by patient: Thread/unthread right underwear leg, Thread/unthread  left underwear leg, Pull underwear up/down Underwear - Performed by helper: Pull underwear up/down Pants- Performed by patient: Thread/unthread right pants leg, Thread/unthread left pants leg, Pull pants up/down Socks - Performed by patient: Don/doff right sock, Don/doff left sock Shoes - Performed by patient: Don/doff right shoe, Don/doff left shoe Assist for footwear: Setup Assist for lower body dressing: More than reasonable time  Function - Toileting Toileting steps completed by patient: Adjust clothing prior to toileting, Performs perineal hygiene, Adjust clothing after toileting Toileting steps completed by helper: Adjust clothing after toileting Toileting Assistive Devices: Grab bar or rail Assist level: Touching or steadying assistance (Pt.75%)  Function - Toilet Transfers Assist level to toilet: Touching or steadying assistance (Pt > 75%) Assist level from toilet: Touching or steadying assistance (Pt > 75%)  Function - Chair/bed transfer Chair/bed transfer method: Ambulatory Chair/bed transfer assist level: Supervision or verbal cues Chair/bed transfer assistive device: Armrests Chair/bed transfer details: Verbal cues for precautions/safety  Function - Locomotion: Wheelchair Will patient use wheelchair at discharge?: No Function - Locomotion: Ambulation Assistive device: No device Max distance: 300 Assist level: Supervision or verbal cues Assist level: Supervision or verbal cues Assist level: Supervision or verbal cues Assist level: Touching or steadying assistance (Pt > 75%) Assist level: Supervision or verbal cues  Function - Comprehension Comprehension: Auditory Comprehension assist level: Follows complex conversation/direction with extra time/assistive device, Understands complex 90% of the time/cues 10% of the time  Function - Expression Expression: Verbal Expression assist level: Expresses complex ideas: With extra time/assistive device  Function - Social  Interaction Social Interaction assist level: Interacts appropriately with others - No medications needed.  Function - Problem Solving Problem solving assist level: Solves complex 90% of the time/cues < 10% of the time  Function - Memory Memory assist level: More than reasonable amount of time Patient normally able to recall (first 3 days only): Current season, Location of own room, Staff names and faces, That he or she is in a hospital  Medical Problem List and Plan: 1.  Left-sided weakness secondary to right MCA infarction. Status post loop recorder             -CIR PT, OT,SLP Team conference today please see physician documentation under team conference tab, met with team face-to-face to discuss problems,progress, and goals. Formulized individual treatment plan based on medical history, underlying problem and comorbidities. 2.  DVT Prophylaxis/Anticoagulation: SCD.Monitor for any signs of DVT 3. Pain Management: Hydrocodone as needed 4. Mood: Provide emotional support 5. Neuropsych: This patient is capable of making decisions on her own behalf. 6. Skin/Wound Care: Routine skin checks 7. Fluids/Electrolytes/Nutrition: Routine I&O with follow up  BMET wnl except elevated glucose 8.Diabetes mellitus peripheral neuropathy.Hemoglobin A1c 12.9.Glucophage 1000 mg twice a day,NovoLog 10 units 3 times a day with meals, Lantus insulin 60 units daily. Check blood sugars before meals and at bedtime. Diabetic teaching Current meds  are metformin 1087m BID does not tolerate due to diarrhea, SSI only qhs,restarted lantus (home med), will change to 10U BID CBG (last 3)   Recent Labs  10/26/16 1645 10/26/16 2039 10/27/16 0653  GLUCAP 258* 315* 164*    9.Hypertension.No current antihypertensive medication. Patient on Norvasc 10 mg daily, Diovan-HCT 320-25 milligrams daily prior to admission. Will restart low dose amlodipine, started 577m will reduce to 2.93m71mMay D/C if systolic BP consistently below  105 Vitals:   10/26/16 1500 10/27/16 0522  BP: 93/72 127/60  Pulse: 75 79  Resp: 18 18  Temp: 98.1 F (36.7 C) 98.2 F (36.8 C)  SpO2: 95% 100%   10. Hyperlipidemia. Lipitor 11. COPD/ Tobacco abuse. Counseling    LOS (Days) 8 A FACE TO FACE EVALUATION WAS PERFORMED  Giancarlos Berendt E 10/27/2016, 7:46 AM

## 2016-10-27 NOTE — Progress Notes (Signed)
Social Work Patient ID: Sarah [name], female   DOB: 03/04/1945, 71 y.o.   MRN: [id number]   Met with pt and sister who was here to discuss team conference goals of mod/i-supervision And discharge tomorrow. Have ordered rollator and bedside commode and these will be delivered to her room prior to discharge home. Discussed follow up and pt would like to go to OPPT. Have made referral to [facility]. Work toward discharge tomorrow.

## 2016-10-27 NOTE — Patient Care Conference (Signed)
Inpatient RehabilitationTeam Conference and Plan of Care Update Date: 10/27/2016   Time: 11:00 AM    Patient Name: Sarah [name]      Medical Record Number: [id number]  Date of Birth: 04/30/1945 Sex: Female         Room/Bed: 4M05C/4M05C-01 Payor Info: Payor: [employer] MEDICARE / Plan: [employer] MEDICARE / Product Type: *No Product type* /    Admitting Diagnosis: mca cva  Admit Date/Time:  10/19/2016  6:48 PM Admission Comments: No comment available   Primary Diagnosis:  <principal problem not specified> Principal Problem: <principal problem not specified>  Patient Active Problem List   Diagnosis Date Noted  . Right middle cerebral artery stroke ([place]) 10/19/2016  . Left arm weakness   . Diastolic dysfunction   . Benign essential HTN   . Tobacco abuse   . Acute blood loss anemia   . Cerebral infarction due to embolism of right middle cerebral artery ([place])   . Stroke (cerebrum) ([place]) 10/15/2016  . Perirectal abscess 09/26/2015  . Vitamin D deficiency 07/23/2015  . Osteopenia 02/16/2013  . Ankle fracture 01/19/2013  . Anemia 09/27/2011  . Uncontrolled type 2 diabetes mellitus with insulin therapy ([place]) 09/24/2011  . Hyperlipidemia 09/24/2011  . Hypertension 09/24/2011    Expected Discharge Date: Expected Discharge Date: 10/28/16  Team Members Present: Physician leading conference: Dr. Alysia Penna Social Worker Present: Ovidio Kin, LCSW Nurse Present: Rozetta Nunnery, RN PT Present: Dwyane Dee, PT OT Present: Cherylynn Ridges, OT SLP Present: Weston Anna, SLP PPS Coordinator present : Daiva Nakayama, RN, CRRN     Current Status/Progress Goal Weekly Team Focus  Medical   left UE weakness improving  Increase safety  D/C planning   Bowel/Bladder   cont B/B; lbm 10/24/16  maintain continence  assess for changes in continence q shift and prn   Swallow/Nutrition/ Hydration             ADL's   Mod I/supervision  Mod I/supervision  L NMR, NMES,  hand-function, modified bathing/dressing   Mobility   supervision for community overall, mod I controlled/home environment  supervision/mod I overall  prepare for D/C   Communication             Safety/Cognition/ Behavioral Observations  Supervision-Mod I  Mod I   Goals Met    Pain   denies  <2/10  assess for pain q shift and prn   Skin   cdi  maintain  assess skin q shift and prn      *See Care Plan and progress notes for long and short-term goals.     Barriers to Discharge  Current Status/Progress Possible Resolutions Date Resolved   Physician    Decreased caregiver support;Medication compliance  refusing meds  d/c planning , as scheduled  Cont rehab      Nursing                  PT                    OT                  SLP                SW                Discharge Planning/Teaching Needs:  Home with sister who is here to assist from [place]. Pt has made great progress and is ready to go home tomorrow  Sister has  been here to go through therapies with   Team Discussion:  Goals supervision-mod.i level, meeting goals. Refusing metformin due to makes ill, MD is aware. Sorbital given due to constipation issues today. Getting good hand return. Sister here and will be assisting at discharge.  Revisions to Treatment Plan:  DC 10/18    Continued Need for Acute Rehabilitation Level of Care: The patient requires daily medical management by a physician with specialized training in physical medicine and rehabilitation for the following conditions: Daily direction of a multidisciplinary physical rehabilitation program to ensure safe treatment while eliciting the highest outcome that is of practical value to the patient.: Yes Daily medical management of patient stability for increased activity during participation in an intensive rehabilitation regime.: Yes Daily analysis of laboratory values and/or radiology reports with any subsequent need for medication adjustment of medical  intervention for : Neurological problems  Fortunato Nordin, Gardiner Rhyme 10/27/2016, 11:27 AM

## 2016-10-27 NOTE — Progress Notes (Signed)
Physical Therapy Discharge Summary  Patient Details  Name: Sarah [name] MRN: [id number] Date of Birth: July 10, 1945  Today's Date: 10/27/2016 PT Individual Time: 5427-0623 PT Individual Time Calculation (min): 57 min    Patient has met 9 of 9 long term goals due to improved activity tolerance, improved balance, improved postural control, increased strength, improved awareness and improved coordination.  Patient to discharge at an ambulatory level Modified Independent.   Patient's care partner is independent to provide the necessary physical assistance at discharge.  Recommendation:  Patient will benefit from ongoing skilled PT services in outpatient setting to continue to advance safe functional mobility, address ongoing impairments in standing balance, activity tolerance, L UE use, and minimize fall risk.  Equipment: Rollator  Reasons for discharge: treatment goals met  Patient/family agrees with progress made and goals achieved: Yes  Skilled PT Interventions No c/o pain throughout session; session focused on discharge preparation and L UE use.  Pt performed all stand pivot transfers and amb community distances throughout session mod I. Pt ascended and descended 12 steps with R railing with supervision-min A and verbal cues for sequencing. Pt performed car transfer mod I. Pt worked on [employer]" puzzle with focused use of L hand for putting together and taking apart puzzle. Pt picked up a single object off the floor with supervision. Pt left upright in w/c with all needs addressed, made mod I in the room until d/c.   PT Discharge Precautions/Restrictions Precautions Precautions: Fall Precaution Comments: L hemi Restrictions Weight Bearing Restrictions: No Pain Pain Assessment Pain Assessment: No/denies pain Vision/Perception  Vision - History Patient Visual Report: No change from baseline Vision - Assessment Additional Comments: Reading glasses  onlt Perception Perception: Within Functional Limits Inattention/Neglect: Appears intact Praxis Praxis: Intact  Cognition Overall Cognitive Status: Within Functional Limits for tasks assessed Arousal/Alertness: Awake/alert Orientation Level: Oriented X4 Attention: Selective Sustained Attention: Appears intact Selective Attention: Appears intact Memory: Appears intact Awareness: Appears intact Problem Solving: Appears intact Reasoning: Appears intact Safety/Judgment: Appears intact Sensation Sensation Light Touch: Appears Intact Light Touch Impaired Details: Impaired LUE Hot/Cold: Appears Intact Coordination Gross Motor Movements are Fluid and Coordinated: No Fine Motor Movements are Fluid and Coordinated: No Coordination and Movement Description: L hemi Finger Nose Finger Test: slight overshoot occasionally to opposite sides of pen Heel Shin Test: good Motor  Motor Motor: Hemiplegia Motor - Discharge Observations: Improved strength and functional use of L side  Mobility Bed Mobility Supine to Sit: 6: Modified independent (Device/Increase time) Supine to Sit Details: Verbal cues for sequencing Transfers Transfers: Yes Sit to Stand: 6: Modified independent (Device/Increase time) Stand to Sit: 6: Modified independent (Device/Increase time) Stand Pivot Transfers: 6: Modified independent (Device/Increase time) Locomotion  Ambulation Ambulation: Yes Ambulation/Gait Assistance: 6: Modified independent (Device/Increase time) Ambulation Distance (Feet): 150 Feet Assistive device: None Ambulation/Gait Assistance Details: Pt amb room to small gym 75' and back to pt room from large gym 150' without AD and mod I.  Gait Gait: Yes Gait Pattern: Impaired Gait Pattern: Decreased step length - left;Step-through pattern Gait velocity: decreased Stairs / Additional Locomotion Stairs: Yes Stairs Assistance: 4: Min guard Stair Management Technique: One rail Right Number of Stairs:  12 Ramp: 6: Modified independent (Device) Curb: 6: Modified independent (Device/increase time) Product manager Mobility: No  Trunk/Postural Assessment  Cervical Assessment Cervical Assessment: Within Functional Limits Thoracic Assessment Thoracic Assessment: Within Functional Limits Lumbar Assessment Lumbar Assessment: Within Functional Limits Postural Control Postural Control: Within Functional Limits Protective Responses: delayed and requires  A to regain large LOB  Balance Balance Balance Assessed: Yes Static Sitting Balance Static Sitting - Balance Support: Feet unsupported Static Sitting - Level of Assistance: 6: Modified independent (Device/Increase time) Static Standing Balance Static Standing - Balance Support: During functional activity Static Standing - Level of Assistance: 6: Modified independent (Device/Increase time) Dynamic Standing Balance Dynamic Standing - Balance Support: During functional activity Dynamic Standing - Level of Assistance: 6: Modified independent (Device/Increase time) Extremity Assessment  RUE Assessment RUE Assessment: Within Functional Limits LUE Assessment LUE Assessment: Exceptions to WFL LUE AROM (degrees) Overall AROM Left Upper Extremity: Within functional limits for tasks assessed LUE Strength LUE Overall Strength Comments: Brunstrom VII - hand Brunstrom IV LUE Tone LUE Tone: Within Functional Limits RLE Assessment RLE Assessment: Within Functional Limits (4+/5) LLE Assessment LLE Assessment: Within Functional Limits (4-4+/5)   See Function Navigator for Current Functional Status.  [name] 10/27/2016, 4:30 PM

## 2016-10-27 NOTE — Progress Notes (Signed)
Speech Language Pathology [facility] Note & Discharge Summary  Patient Details  Name: Sarah [name] MRN: [id number] Date of Birth: Jan 26, 1945  Today's Date: 10/27/2016 SLP Individual Time: 0730-0830 SLP Individual Time Calculation (min): 60 min   Skilled Therapeutic Interventions:   Skilled treatment session focused on cognitive goals. SLP facilitated session by administering the MoCA (version 7.3). Patient scored 28/30 points with a score of 26 or above considered normal. Patient demonstrated decreased short-term recall of 5 words, however, patient is Mod I for recall of functional information. SLP educated patient in regards to her current cognitive function and strategies to utilize at home to maximize recall. Patient verbalized understanding. Patient left upright in bed with all needs within reach. Continue with current plan of care.   Patient has met 4 of 4 long term goals.  Patient to discharge at overall Modified Independent level.   Reasons goals not met: N/A   Clinical Impression/Discharge Summary: Patient has made excellent gains and has met 4 of 4 LTG's this admission. Currently, patient is overall Mod I to complete functional and familiar tasks safely in regards to recall and problem solving. Suspect patient is at her baseline level of cognitive functioning, therefore, skilled SLP f/u is not warranted at this time. Education has been completed and patient will d/c home with family.    Care Partner:  Caregiver Able to Provide Assistance: Yes     Recommendation:  None      Equipment: N/A   Reasons for discharge: Discharged from hospital;Treatment goals met   Patient/Family Agrees with Progress Made and Goals Achieved: No   Function:  Eating Eating     Eating Assist Level: No help, No cues           Cognition Comprehension Comprehension assist level: Follows complex conversation/direction with extra time/assistive device;Understands complex 90% of the time/cues  10% of the time  Expression   Expression assist level: Expresses complex ideas: With extra time/assistive device  Social Interaction Social Interaction assist level: Interacts appropriately with others - No medications needed.  Problem Solving Problem solving assist level: Solves complex problems: With extra time  Memory Memory assist level: More than reasonable amount of time   Sarah [name] 10/27/2016, 3:36 PM

## 2016-10-27 NOTE — Progress Notes (Signed)
Occupational Therapy Session Note  Patient Details  Name: Sarah [name] MRN: [id number] Date of Birth: Sep 04, 1945  Today's Date: 10/27/2016 OT Individual Time: 1004-1101 OT Individual Time Calculation (min): 57 min    Short Term Goals: Week 1:  OT Short Term Goal 1 (Week 1): STG=LTG  Skilled Therapeutic Interventions/Progress Updates:    OT treatment session focused on home management, tub shower transfers, L fine motor control, L NMR, and L NMES. Pt ambulated to therapy apartment and practiced stepping over tub shower ledge in simulated home environment. Pt completed with supervision. Pt then ambulated in kitchen and practiced access to upper/lower cabinets, pantry, and refrigerator. L NMR fine-motor coordination/NMR with thera-putty activity focused on pinch strength. Graded peg board activity with pt able to grasp smaller pegs today using 3 point pinch. 1:1 NMES applied to flexor tendons.  Ratio 1:1 Rate 35 pps Waveform- Asymmetric Ramp 1.0 Pulse 300 Intensity- 20 Duration -   15  No adverse reactions after treatment and is skin intact.     Therapy Documentation Precautions:  Precautions Precautions: Fall Restrictions Weight Bearing Restrictions: No Pain: Pain Assessment Pain Assessment: No/denies pain Pain Score: 0-No pain ADL: ADL ADL Comments: see functional navigator  See Function Navigator for Current Functional Status.   Therapy/Group: Individual Therapy  Valma Cava 10/27/2016, 10:33 AM

## 2016-10-27 NOTE — Progress Notes (Signed)
Occupational Therapy Discharge Summary  Patient Details  Name: Sarah [name] MRN: [id number] Date of Birth: 08/31/1945  Today's Date: 10/27/2016 OT Individual Time: 1500-1530 OT Individual Time Calculation (min): 30 min   Patient has met 12 of 12 long term goals due to improved activity tolerance, improved balance, postural control, ability to compensate for deficits and functional use of  LEFT upper and LEFT lower extremity.  Patient to discharge at overall  Modified Independent level.  Patient's care partner is independent to provide the necessary physical assistance at discharge.    Reasons goals not met: n/a  Recommendation:  Patient will benefit from ongoing skilled OT services in home health setting to continue to advance functional skills in the area of BADL.  Equipment: 3-in-1 BSC, rollator walker  Reasons for discharge: treatment goals met and discharge from hospital  Patient/family agrees with progress made and goals achieved: Yes  OT Discharge Precautions/Restrictions  Precautions Precautions: Fall Precaution Comments: L hemi Restrictions Weight Bearing Restrictions: No Pain Pain Assessment Pain Assessment: No/denies pain ADL ADL Eating: Modified independent Grooming: Modified independent Upper Body Bathing: Modified independent Lower Body Bathing: Modified independent Upper Body Dressing: Modified independent (Device) Lower Body Dressing: Modified independent Toileting: Modified independent Toilet Transfer: Modified independent Tub/Shower Transfer: Modified independent Social research officer, government: Modified independent ADL Comments: Please see functional navigator Vision Baseline Vision/History: Wears glasses;Cataracts;Glaucoma Wears Glasses:  (reading and driving at night) Patient Visual Report: Other (comment) (some complaints of seeing double letters but only occassionally (pt told MD and adjusting BP meds in response to seeing double  letters)) Additional Comments: Reading glasses onlt Perception  Perception: Within Functional Limits Inattention/Neglect: Appears intact Praxis Praxis: Intact Cognition Overall Cognitive Status: Within Functional Limits for tasks assessed Arousal/Alertness: Awake/alert Orientation Level: Oriented X4 Attention: Selective Sustained Attention: Appears intact Selective Attention: Appears intact Memory: Appears intact Awareness: Appears intact Problem Solving: Appears intact Reasoning: Appears intact Safety/Judgment: Appears intact Sensation Sensation Light Touch: Appears Intact Light Touch Impaired Details: Impaired LUE Hot/Cold: Appears Intact Coordination Gross Motor Movements are Fluid and Coordinated: No Fine Motor Movements are Fluid and Coordinated: No Coordination and Movement Description: L hemi Finger Nose Finger Test: slight overshoot occasionally to opposite sides of pen Heel Shin Test: good Motor  Motor Motor: Hemiplegia Motor - Discharge Observations: Improved strength and functional use of L side Mobility  Bed Mobility Supine to Sit: 6: Modified independent (Device/Increase time) Supine to Sit Details: Verbal cues for sequencing Transfers Sit to Stand: 6: Modified independent (Device/Increase time) Stand to Sit: 6: Modified independent (Device/Increase time)  Trunk/Postural Assessment  Cervical Assessment Cervical Assessment: Within Functional Limits Thoracic Assessment Thoracic Assessment: Within Functional Limits Lumbar Assessment Lumbar Assessment: Within Functional Limits Postural Control Postural Control: Within Functional Limits Protective Responses: delayed and requires A to regain large LOB  Balance Balance Balance Assessed: Yes Static Sitting Balance Static Sitting - Balance Support: Feet unsupported Static Sitting - Level of Assistance: 6: Modified independent (Device/Increase time) Static Standing Balance Static Standing - Balance  Support: During functional activity Static Standing - Level of Assistance: 6: Modified independent (Device/Increase time) Dynamic Standing Balance Dynamic Standing - Balance Support: During functional activity Dynamic Standing - Level of Assistance: 6: Modified independent (Device/Increase time) Extremity/Trunk Assessment RUE Assessment RUE Assessment: Within Functional Limits LUE Assessment LUE Assessment: Exceptions to WFL LUE AROM (degrees) Overall AROM Left Upper Extremity: Within functional limits for tasks assessed LUE Strength LUE Overall Strength Comments: Brunstrom VII - hand Brunstrom IV LUE Tone LUE Tone: Within Functional Limits  See Function Navigator for Current Functional Status.  Sarah [name] Sarah [name] 10/27/2016, 3:51 PM

## 2016-10-27 NOTE — Discharge Summary (Signed)
Discharge summary job [id number]

## 2016-10-27 NOTE — Discharge Instructions (Signed)
Inpatient Rehab Discharge Instructions  Sarah [name] Discharge date and time: No discharge date for patient encounter.   Activities/Precautions/ Functional Status: Activity: activity as tolerated Diet: diabetic diet Wound Care: none needed Functional status:  ___ No restrictions     ___ Walk up steps independently ___ 24/7 supervision/assistance   ___ Walk up steps with assistance ___ Intermittent supervision/assistance  ___ Bathe/dress independently ___ Walk with walker     __x_ Bathe/dress with assistance ___ Walk Independently    ___ Shower independently ___ Walk with assistance    ___ Shower with assistance ___ No alcohol     ___ Return to work/school ________  Special Instructions: No driving   COMMUNITY REFERRALS UPON DISCHARGE:    Outpatient: PT  Agency:[facility] Phone:[phone]   Date of Last Service:10/28/2016  Appointment Date/Time:OCTOBER 29 Monday 8:30-9:30 AM  Medical Equipment/Items Ordered:ROLLATOR [name] AND 3 IN 1  Agency/Supplier:[facility]   [phone]   GENERAL COMMUNITY RESOURCES FOR PATIENT/FAMILY: Support Groups:[facility] SUPPORT GROUP EVERY SECOND Thursday @ 3:00-4:00 PM ON THE REHAB UNIT QUESTIONS CONTACT [place] [phone]   STROKE/TIA DISCHARGE INSTRUCTIONS SMOKING Cigarette smoking nearly doubles your risk of having a stroke & is the single most alterable risk factor  If you smoke or have smoked in the last 12 months, you are advised to quit smoking for your health.  Most of the excess cardiovascular risk related to smoking disappears within a year of stopping.  Ask you doctor about anti-smoking medications  [place] Quit Line: 1-800-QUIT NOW  Free Smoking Cessation Classes [phone]  CHOLESTEROL Know your levels; limit fat & cholesterol in your diet  Lipid Panel     Component Value Date/Time   CHOL 156 10/16/2016 0533   TRIG 104 10/16/2016 0533   HDL 37 (L) 10/16/2016 0533   CHOLHDL 4.2  10/16/2016 0533   VLDL 21 10/16/2016 0533   LDLCALC 98 10/16/2016 0533      Many patients benefit from treatment even if their cholesterol is at goal.  Goal: Total Cholesterol (CHOL) less than 160  Goal:  Triglycerides (TRIG) less than 150  Goal:  HDL greater than 40  Goal:  LDL (LDLCALC) less than 100   BLOOD PRESSURE American Stroke Association blood pressure target is less that 120/80 mm/Hg  Your discharge blood pressure is:  BP: 127/60  Monitor your blood pressure  Limit your salt and alcohol intake  Many individuals will require more than one medication for high blood pressure  DIABETES (A1c is a blood sugar average for last 3 months) Goal HGBA1c is under 7% (HBGA1c is blood sugar average for last 3 months)  Diabetes:    Lab Results  Component Value Date   HGBA1C 12.9 (H) 10/16/2016     Your HGBA1c can be lowered with medications, healthy diet, and exercise.  Check your blood sugar as directed by your physician  Call your physician if you experience unexplained or low blood sugars.  PHYSICAL ACTIVITY/REHABILITATION Goal is 30 minutes at least 4 days per week  Activity: Increase activity slowly, Therapies: Physical Therapy: Home Health Return to work:   Activity decreases your risk of heart attack and stroke and makes your heart stronger.  It helps control your weight and blood pressure; helps you relax and can improve your mood.  Participate in a regular exercise program.  Talk with your doctor about the best form of exercise for you (dancing, walking, swimming, cycling).  DIET/WEIGHT Goal is to maintain a healthy weight  Your  discharge diet is: Diet heart healthy/carb modified Room service appropriate? Yes; Fluid consistency: Thin  liquids Your height is:  Height: 5' (152.4 cm) Your current weight is: Weight: 61 kg (134 lb 7.5 oz) Your Body Mass Index (BMI) is:  BMI (Calculated): 26.26  Following the type of diet specifically designed for you will help prevent  another stroke.  Your goal weight range is:    Your goal Body Mass Index (BMI) is 19-24.  Healthy food habits can help reduce 3 risk factors for stroke:  High cholesterol, hypertension, and excess weight.  RESOURCES Stroke/Support Group:  Call [phone]   STROKE EDUCATION PROVIDED/REVIEWED AND GIVEN TO PATIENT Stroke warning signs and symptoms How to activate emergency medical system (call 911). Medications prescribed at discharge. Need for follow-up after discharge. Personal risk factors for stroke. Pneumonia vaccine given:  Flu vaccine given:  My questions have been answered, the writing is legible, and I understand these instructions.  I will adhere to these goals & educational materials that have been provided to me after my discharge from the hospital.      My questions have been answered and I understand these instructions. I will adhere to these goals and the provided educational materials after my discharge from the hospital.  Patient/Caregiver Signature _______________________________ Date __________  Clinician Signature _______________________________________ Date __________  Please bring this form and your medication list with you to all your follow-up doctor's appointments.

## 2016-10-27 NOTE — Significant Event (Signed)
Patient refused metformin, stated "it is making me nauseous and giving me diarrhea". She does not want to take metformin any more.  Metformin discontinued by Dr Read Drivers on 10/17.

## 2016-10-28 ENCOUNTER — Other Ambulatory Visit: Payer: Self-pay | Admitting: Family Medicine

## 2016-10-28 DIAGNOSIS — Z23 Encounter for immunization: Secondary | ICD-10-CM | POA: Diagnosis not present

## 2016-10-28 LAB — GLUCOSE, CAPILLARY: Glucose-Capillary: 185 mg/dL — ABNORMAL HIGH (ref 65–99)

## 2016-10-28 MED ORDER — INSULIN GLARGINE 100 UNIT/ML SOLOSTAR PEN: 15.0000 [IU] | PEN_INJECTOR | Freq: Two times a day (BID) | SUBCUTANEOUS | 11 refills | Status: DC

## 2016-10-28 NOTE — Progress Notes (Signed)
Subjective/Complaints:    ROS:  Denies CP, SOB, N/V/D, last BM 10/10  Objective: Vital Signs: Blood pressure (!) 140/56, pulse 73, temperature 98.5 F (36.9 C), temperature source Oral, resp. rate 18, height 5' (1.524 m), weight 61 kg (134 lb 7.5 oz), SpO2 97 %. No results found. Results for orders placed or performed during the hospital encounter of 10/19/16 (from the past 72 hour(s))  Glucose, capillary     Status: Abnormal   Collection Time: 10/25/16  1:21 PM  Result Value Ref Range   Glucose-Capillary 276 (H) 65 - 99 mg/dL  Glucose, capillary     Status: Abnormal   Collection Time: 10/25/16  4:30 PM  Result Value Ref Range   Glucose-Capillary 240 (H) 65 - 99 mg/dL  Glucose, capillary     Status: Abnormal   Collection Time: 10/25/16  9:33 PM  Result Value Ref Range   Glucose-Capillary 281 (H) 65 - 99 mg/dL  Glucose, capillary     Status: Abnormal   Collection Time: 10/26/16  6:45 AM  Result Value Ref Range   Glucose-Capillary 180 (H) 65 - 99 mg/dL  Glucose, capillary     Status: Abnormal   Collection Time: 10/26/16 11:51 AM  Result Value Ref Range   Glucose-Capillary 277 (H) 65 - 99 mg/dL  Glucose, capillary     Status: Abnormal   Collection Time: 10/26/16  4:45 PM  Result Value Ref Range   Glucose-Capillary 258 (H) 65 - 99 mg/dL  Glucose, capillary     Status: Abnormal   Collection Time: 10/26/16  8:39 PM  Result Value Ref Range   Glucose-Capillary 315 (H) 65 - 99 mg/dL  Glucose, capillary     Status: Abnormal   Collection Time: 10/27/16  6:53 AM  Result Value Ref Range   Glucose-Capillary 164 (H) 65 - 99 mg/dL  Glucose, capillary     Status: Abnormal   Collection Time: 10/27/16 11:52 AM  Result Value Ref Range   Glucose-Capillary 237 (H) 65 - 99 mg/dL  Glucose, capillary     Status: Abnormal   Collection Time: 10/27/16  4:51 PM  Result Value Ref Range   Glucose-Capillary 315 (H) 65 - 99 mg/dL  Glucose, capillary     Status: Abnormal   Collection Time:  10/27/16  9:28 PM  Result Value Ref Range   Glucose-Capillary 311 (H) 65 - 99 mg/dL  Glucose, capillary     Status: Abnormal   Collection Time: 10/28/16  6:44 AM  Result Value Ref Range   Glucose-Capillary 185 (H) 65 - 99 mg/dL     HEENT: poor dentition Cardio: RRR and no murmur Resp: CTA B/L and unlabored GI: BS positive and NT, ND Extremity:  No Edema Skin:   Intact Neuro: Alert/Oriented, Normal Sensory, Abnormal Motor 3/5 Left delt, bi, tri, 2- finger flex and ext, 3/5 L HF, KE, ADF, Abnormal FMC Ataxic/ dec FMC and Dysarthric Musc/Skel:  Normal, no pain with UE or LE ROM Gen NAD   Assessment/Plan: 1. Functional deficits secondary to Left hemiparesis, R MCA infarct  Stable for D/C today F/u PCP in 3-4 weeks F/u PM&R 2 weeks See D/C summary See D/C instructions FIM: Function - Bathing Position: Shower Body parts bathed by patient: Right arm, Left arm, Chest, Front perineal area, Abdomen, Buttocks, Right lower leg, Left lower leg, Left upper leg, Right upper leg Body parts bathed by helper: Right arm, Back Bathing not applicable: Back Assist Level: More than reasonable time Set up : To obtain items  Function- Upper Body Dressing/Undressing What is the patient wearing?: Pull over shirt/dress Pull over shirt/dress - Perfomed by patient: Thread/unthread right sleeve, Thread/unthread left sleeve, Put head through opening, Pull shirt over trunk Assist Level: No help, No cues Function - Lower Body Dressing/Undressing What is the patient wearing?: Underwear, Pants, Socks, Shoes Position: Sitting EOB Underwear - Performed by patient: Thread/unthread right underwear leg, Thread/unthread left underwear leg, Pull underwear up/down Underwear - Performed by helper: Pull underwear up/down Pants- Performed by patient: Thread/unthread right pants leg, Thread/unthread left pants leg, Pull pants up/down Socks - Performed by patient: Don/doff right sock, Don/doff left sock Shoes -  Performed by patient: Don/doff right shoe, Don/doff left shoe Assist for footwear: Independent Assist for lower body dressing: No Help, No cues  Function - Toileting Toileting steps completed by patient: Adjust clothing prior to toileting, Performs perineal hygiene, Adjust clothing after toileting Toileting steps completed by helper: Adjust clothing after toileting Toileting Assistive Devices: Grab bar or rail Assist level: No help/no cues  Function Midwife transfer assistive device: Walker Assist level to toilet: No Help, no cues, assistive device, takes more than a reasonable amount of time Assist level from toilet: No Help, no cues, assistive device, takes more than a reasonable amount of time  Function - Chair/bed transfer Chair/bed transfer method: Ambulatory Chair/bed transfer assist level: No Help, no cues, assistive device, takes more than a reasonable amount of time Chair/bed transfer assistive device: Armrests Chair/bed transfer details: Verbal cues for precautions/safety  Function - Locomotion: Wheelchair Will patient use wheelchair at discharge?: No Function - Locomotion: Ambulation Assistive device: No device Max distance: 150 Assist level: No help, No cues, assistive device, takes more than a reasonable amount of time Assist level: No help, No cues, assistive device, takes more than a reasonable amount of time Assist level: No help, No cues, assistive device, takes more than a reasonable amount of time Assist level: No help, No cues, assistive device, takes more than a reasonable amount of time Assist level: Supervision or verbal cues  Function - Comprehension Comprehension: Auditory Comprehension assist level: Follows complex conversation/direction with extra time/assistive device, Understands complex 90% of the time/cues 10% of the time  Function - Expression Expression: Verbal Expression assist level: Expresses complex ideas: With extra  time/assistive device  Function - Social Interaction Social Interaction assist level: Interacts appropriately with others - No medications needed.  Function - Problem Solving Problem solving assist level: Solves complex problems: With extra time  Function - Memory Memory assist level: More than reasonable amount of time Patient normally able to recall (first 3 days only): Current season, Location of own room, Staff names and faces, That he or she is in a hospital  Medical Problem List and Plan: 1.  Left-sided weakness secondary to right MCA infarction. Status post loop recorder            D/C home2.  DVT Prophylaxis/Anticoagulation: SCD.Monitor for any signs of DVT 3. Pain Management: Hydrocodone as needed 4. Mood: Provide emotional support 5. Neuropsych: This patient is capable of making decisions on her own behalf. 6. Skin/Wound Care: Routine skin checks 7. Fluids/Electrolytes/Nutrition: Routine I&O with follow up  BMET wnl except elevated glucose 8.Diabetes mellitus peripheral neuropathy.Hemoglobin A1c 12.9.Glucophage 1000 mg twice a day,NovoLog 10 units 3 times a day with meals, Lantus insulin 60 units daily. Check blood sugars before meals and at bedtime. Diabetic teaching Current meds are metformin 1000mg  BID does not tolerate due to diarrhea, SSI only qhs,restarted lantus (  home med), will change to 15U BID CBG (last 3)   Recent Labs  10/27/16 1651 10/27/16 2128 10/28/16 0644  GLUCAP 315* 311* 185*    9.Hypertension.No current antihypertensive medication. Patient on Norvasc 10 mg daily, Diovan-HCT 320-25 milligrams daily prior to admission. Will restart low dose amlodipine, started 5mg , will reduce to 2.5mg , May D/C if systolic BP consistently below 105 Vitals:   10/27/16 1409 10/28/16 0534  BP: (!) 111/45 (!) 140/56  Pulse: 69 73  Resp: 17 18  Temp: 98.4 F (36.9 C) 98.5 F (36.9 C)  SpO2: 98% 97%   10. Hyperlipidemia. Lipitor 11. COPD/ Tobacco abuse.  Counseling    LOS (Days) 9 A FACE TO FACE EVALUATION WAS PERFORMED  KIRSTEINS,ANDREW E 10/28/2016, 7:11 AM

## 2016-10-28 NOTE — Progress Notes (Signed)
Social Work  Discharge Note  The overall goal for the admission was met for:   Discharge location: Yes-HOME WITH MOM AND SISTER TO ASSIST  Length of Stay: Yes-9 DAYS  Discharge activity level: Yes-MOD/I-SUPERVISION LEVEL  Home/community participation: Yes  Services provided included: MD, RD, PT, OT, SLP, RN, CM, Pharmacy and SW  Financial Services: Private Insurance: [employer]MEDICARE  Follow-up services arranged: Outpatient: [facility]-PT 10/29 8:30-9:30 AM, DME: [facility] AND [facility]IN[facility] and Patient/Family has no preference for HH/DME agencies  Comments (or additional information):PT DID WELL AND REACHED MOD/I LEVEL, SISTER AND MOM TO ASSIST AND TRANSPORT TO OP APPOINTMENTS  Patient/Family verbalized understanding of follow-up arrangements: Yes  Individual responsible for coordination of the follow-up plan: SELF & [name]-SISTER  Confirmed correct DME delivered: ,  G 10/28/2016    ,  G 

## 2016-10-28 NOTE — Discharge Summary (Signed)
Sarah [name], Sarah [name]            ACCOUNT NO.:  [id number]  MEDICAL RECORD NO.:  [id number]  LOCATION:                                 FACILITY:  PHYSICIAN:  Charlett Blake, M.D.DATE OF BIRTH:  10-Apr-1945  DATE OF ADMISSION:  10/20/2016 DATE OF DISCHARGE:  10/28/2016                              DISCHARGE SUMMARY   DISCHARGE DIAGNOSES: 1. Right middle cerebral artery infarction. 2. SCDs for DVT prophylaxis. 3. Pain management. 4. Diabetes mellitus. 5. Peripheral neuropathy. 6. Hypertension. 7. Hyperlipidemia. 8. Chronic obstructive pulmonary disease with tobacco abuse.  HISTORY OF PRESENT ILLNESS:  This is a 71 year old right-handed female with history of hypertension, hyperlipidemia, diabetes mellitus, tobacco abuse, who presented on October 15, 2016, with left-sided weakness and facial droop.  She lives with her elderly mother independent prior to admission.  Cranial CT scan completed showing right frontal infarction. MRI, MRA showed no acute hemorrhage or mass effect.  Multifocal ischemia in right MCA distribution.  The patient did not receive tPA. Echocardiogram with ejection fraction of 96%, grade 2 diastolic dysfunction.  Carotid Dopplers with 40-59% right ICA stenosis.  CT angiogram of the neck showed no emboli.  Neurology consulted, placed on aspirin and Plavix therapy.  TEE showed no PFO without masses.  A loop recorder was placed.  Physical and occupational therapy ongoing.  The patient was admitted for a comprehensive rehab program.  PAST MEDICAL HISTORY:  See discharge diagnoses.  SOCIAL HISTORY:  She lives with elderly mother, independent prior to admission.  FUNCTIONAL STATUS UPON ADMISSION TO REHAB SERVICES:  Min to mod assist 100 feet, one-person handheld assist, minimal assist sit to stand, min to mod assist activities of daily living.  PHYSICAL EXAMINATION:  VITAL SIGNS:  Blood pressure 127/57, pulse 75, temperature 98, and respirations  18. GENERAL:  This was an alert female, in no acute distress. HEENT:  EOMs intact. NECK:  Supple.  Nontender.  No JVD. CARDIAC:  Rate controlled. ABDOMEN:  Soft, nontender.  Good bowel sounds. LUNGS:  Clear to auscultation.  REHABILITATION HOSPITAL COURSE:  The patient was admitted to Inpatient Rehab Services.  Therapies initiated on a 3-hour daily basis consisting of physical therapy, occupational therapy, speech therapy, and rehabilitation nursing.  The following issues were addressed during the patient's rehab stay.  Pertaining to Sarah [name]'s right MCA infarction, remained stable.  Aspirin and Plavix for CVA prophylaxis. She would follow up with neurology Services.  A loop recorder was placed with plans to follow up with cardiology Services.  Blood pressures were well controlled.  She was on her Norvasc.  Blood sugars with hemoglobin A1c 12.9, Lantus insulin as advised, full diabetic teaching. Her Glucophage was discontinued due to ongoing bouts of diarrhea.  The patient received weekly collaborative interdisciplinary team conferences to discuss estimated length of stay, family teaching, any barriers to discharge.  She was ambulating supervision, working with energy conservation techniques, navigating stairs with supervision, gather belongings for activities of daily living, homemaking, dressing, and grooming.  She was advised no driving.  Tolerating a regular consistency diet.  She was discharged to home.  DISCHARGE MEDICATIONS: 1. Norvasc 2.5 mg p.o. daily. 2. Aspirin 325 mg p.o. daily. 3. Lipitor 40 mg  p.o. daily. 4. Plavix 75 mg p.o. daily. 5. Lantus insulin 15 units b.i.d. 6. Xalatan ophthalmic solution 1 drop both eyes at bedtime. 7. Vitamin D weekly. 8. Hydrocodone 1-2 tablets every 6 hours as needed pain, dispense of     20 tablets.  DIET:  Diabetic diet.  FOLLOWUP:  She would follow up with Dr. Alysia Penna at the [facility] as dictated; Dr.  Betty [name], Medical Management; [facility] on November 01, 2016; Dr. Erlinda Hong, Neurology Services.  SPECIAL INSTRUCTIONS:  No driving or smoking.     Lauraine Rinne, P.A.   ______________________________ Charlett Blake, M.D.    DA/MEDQ  D:  10/27/2016  T:  10/27/2016  Job:  [id number]  cc:   [facility] Charlett Blake, M.D. Dr. Betty [name] Dr. Erlinda Hong

## 2016-10-28 NOTE — Progress Notes (Signed)
Education material about carb. Mod. Diet were given to the pt. D/C papers,equipment and follow up appointments as well. Pt. Is ready to go home with her family.

## 2016-10-29 ENCOUNTER — Telehealth: Payer: Self-pay | Admitting: *Deleted

## 2016-10-29 ENCOUNTER — Telehealth: Payer: Self-pay

## 2016-10-29 NOTE — Telephone Encounter (Signed)
Unable to reach patient at time of TCM Call. Left message for patient to return call when available.  

## 2016-10-29 NOTE — Telephone Encounter (Signed)
Transition Care Management Follow-up Telephone Call  Per [facility] Summary: DATE OF ADMISSION:  10/20/2016 DATE OF DISCHARGE:  10/28/2016                              DISCHARGE SUMMARY   DISCHARGE DIAGNOSES: 1. Right middle cerebral artery infarction. 2. SCDs for DVT prophylaxis. 3. Pain management. 4. Diabetes mellitus. 5. Peripheral neuropathy. 6. Hypertension. 7. Hyperlipidemia. 8. Chronic obstructive pulmonary disease with tobacco abuse.  --   How have you been since you were released from the hospital? "I got home yesterday and this morning things have been fine."    Do you understand why you were in the hospital? yes   Do you understand the discharge instructions? yes   Where were you discharged to? Home   Items Reviewed:  Medications reviewed: yes  Allergies reviewed: yes  Dietary changes reviewed: yes  Referrals reviewed: yes, Dr. Letta Pate (outpatient rehab), Dr. Erlinda Hong (neurology), outpatient PT   Functional Questionnaire:   Activities of Daily Living (ADLs):   She states they are independent in the following: ambulation, bathing and hygiene, feeding, continence, grooming, toileting and dressing States they require assistance with the following: standby assist from family members. Ambulating with rolling walker and also has BSC/shower chair.   Any transportation issues/concerns?: yes, needs all morning appointments due to sister having to drive her and sister works in the afternoon.   Any patient concerns? yes, patient has not had BM since discharge. Recommended increased fluid intake and ambulation to stimulate bowels and could try OTC stool softener if no BM.   Confirmed importance and date/time of follow-up visits scheduled yes  Provider Appointment booked with Dr. Betty [name] 11/05/16 @ 9:00am  Confirmed with patient if condition begins to worsen call PCP or go to the ER.  Patient was given the office number and encouraged to call back with  question or concerns.  : yes

## 2016-10-29 NOTE — Telephone Encounter (Signed)
Transitional Care call  Patient name: Sarah [name]                                                   ) DOB: (                                   ) 1. Are you/is patient experiencing any problems since coming home? (                       no     ) a. Are there any questions regarding any aspect of care? (  no                          ) 2. Are there any questions regarding medications administration/dosing? (                  no          ) a. Are meds being taken as prescribed? (             yes               ) b. "Patient should review meds with caller to confirm"  3. Have there been any falls? (      no                ) 4. Has Home Health been to the house and/or have they contacted you? (                      Patient has outpatient therapy scheduled      ) a. If not, have you tried to contact them? (                              ) b. Can we help you contact them? (                               ) 5. Are bowels and bladder emptying properly? (        Bladder is working fine but patient has not had a bowel movement in 4-5 days, she stated that the hospital gave her something for it before she left                ) a. Are there any unexpected incontinence issues? (    no                              ) b. If applicable, is patient following bowel/bladder programs? (                          ) 6. Any fevers, problems with breathing, unexpected pain? (    no                            ) 7. Are there any skin  problems or new areas of breakdown? (  no                                 ) 8. Has the patient/family member arranged specialty MD follow up (ie cardiology/neurology/renal/surgical/etc.)?  (            yes                       ) a. Can we help arrange? (                                     ) 9. Does the patient need any other services or support that we can help arrange? (                  no         ) 10. Are caregivers following through as expected in assisting the patient? (                     yes         ) 11. Has the patient quit smoking, drinking alcohol, or using drugs as recommended? (        yes                )  Appointment date/time (    11/11/16                              ),  arrive time (                   11:15                 )  and who it is with here [employer]                                     ) [place]

## 2016-11-01 ENCOUNTER — Other Ambulatory Visit: Payer: Self-pay

## 2016-11-01 ENCOUNTER — Ambulatory Visit (INDEPENDENT_AMBULATORY_CARE_PROVIDER_SITE_OTHER): Payer: Self-pay | Admitting: *Deleted

## 2016-11-01 DIAGNOSIS — I639 Cerebral infarction, unspecified: Secondary | ICD-10-CM

## 2016-11-01 LAB — CUP PACEART INCLINIC DEVICE CHECK
Date Time Interrogation Session: 20181022130500
Implantable Pulse Generator Implant Date: 20181009

## 2016-11-01 NOTE — Patient Outreach (Signed)
[facility]) Care Management  11/01/2016  Sarah [name] October 10, 1945 [id number]  EMMI: stroke Referral date: 11/01/16 Referral source: EMMI stroke red alert Referral reason: Been able to take every dose of medication: NO Day # 1  Telephone call to patient regarding EMMI stroke red alert.  HIPAA verified with patient. Discussed EMMI stroke program with patient. Patient states she is not having any problems with her medications. Patient states she had not taken her medication yet at the time the automated call came in. Patient reports she has and is taking all of her medications as prescribed. Patient reports she has a follow up appointment with her primary MD on 11/08/16 and a follow up with the neurologist next week. Patient states she saw the doctor regarding her loop recorder today. RNCM reviewed signs and symptoms of infection with patient. Patient denies any signs of infection. Patient states he has a follow up scheduled to see the cardiologist.  Patient reports she has transportation to her doctor appointments.  Patient verbally agreed to continued EMMI stroke calls. Patient denies having any further needs at this time. Patient offered Advance directive. Patient agreed to advanced directive packet being mailed to her. Patient informed she can contact [facility] care management for questions/ concerns regarding the Advance directive.  RNCM advised patient to notify MD of any changes in condition prior to scheduled appointment. RNCM reviewed signs/ symptoms of stroke and advised patient to call 911 for stroke like symptoms. Patient verbalized understanding.  RNCM provided contact name and number: [phone] or main office number [phone] and 24 hour nurse advise line [phone].  RNCM verified patient aware of 911 services for urgent/ emergent needs.  ASSESSMENT: Per patients MEDICAL RECORD NUMBER DATE OF ADMISSION:  10/20/2016 DATE OF DISCHARGE:  10/28/2016                  DISCHARGE DIAGNOSES: 1. Right middle cerebral artery infarction. 2. SCDs for DVT prophylaxis. 3. Pain management. 4. Diabetes mellitus. 5. Peripheral neuropathy. 6. Hypertension. 7. Hyperlipidemia. 8. Chronic obstructive pulmonary disease with tobacco abuse.  This is a 71 year old right-handed female with history of hypertension, hyperlipidemia, diabetes mellitus, tobacco abuse  PLAN: RNCM will send patient Advance directive packet as discussed. RNCM will refer patient to care management assistant to close due to patient being assessed and having no further needs.  Quinn Plowman RN,BSN,CCM [facility] Telephonic  [phone]

## 2016-11-01 NOTE — Progress Notes (Signed)
Wound check appointment. Steri-strips removed. Wound without redness or edema. Incision edges approximated, wound well healed. Loop check in clinic. Battery status: good. R-waves 0.54mV. 0 symptom episodes, 0 tachy episodes, 0 pause episodes, 0 brady episodes. 0 AF episodes. Monthly summary reports and ROV with JA PRN

## 2016-11-02 ENCOUNTER — Encounter: Payer: Self-pay | Admitting: Family Medicine

## 2016-11-02 ENCOUNTER — Ambulatory Visit (INDEPENDENT_AMBULATORY_CARE_PROVIDER_SITE_OTHER): Payer: Medicare Other | Admitting: Family Medicine

## 2016-11-02 VITALS — BP 130/70 | HR 83 | Temp 98.5°F | Resp 12 | Ht 60.0 in | Wt 144.2 lb

## 2016-11-02 DIAGNOSIS — I69154 Hemiplegia and hemiparesis following nontraumatic intracerebral hemorrhage affecting left non-dominant side: Secondary | ICD-10-CM

## 2016-11-02 DIAGNOSIS — I6529 Occlusion and stenosis of unspecified carotid artery: Secondary | ICD-10-CM

## 2016-11-02 DIAGNOSIS — R29898 Other symptoms and signs involving the musculoskeletal system: Secondary | ICD-10-CM | POA: Diagnosis not present

## 2016-11-02 DIAGNOSIS — D62 Acute posthemorrhagic anemia: Secondary | ICD-10-CM

## 2016-11-02 DIAGNOSIS — Z794 Long term (current) use of insulin: Secondary | ICD-10-CM | POA: Diagnosis not present

## 2016-11-02 DIAGNOSIS — I63411 Cerebral infarction due to embolism of right middle cerebral artery: Secondary | ICD-10-CM

## 2016-11-02 DIAGNOSIS — I119 Hypertensive heart disease without heart failure: Secondary | ICD-10-CM | POA: Diagnosis not present

## 2016-11-02 DIAGNOSIS — E1165 Type 2 diabetes mellitus with hyperglycemia: Secondary | ICD-10-CM | POA: Diagnosis not present

## 2016-11-02 DIAGNOSIS — IMO0002 Reserved for concepts with insufficient information to code with codable children: Secondary | ICD-10-CM

## 2016-11-02 NOTE — Progress Notes (Signed)
HPI:   Sarah [name] is a 71 y.o. female, who is here today with her sister to follow on recent hospitalization.  TCM 10/29/16.  She was hospitalized from 10/15/2016 to 10/19/16 and transferred to inpatient PT: 10/20/16 to 10/28/2016.  10/19/16 Brain MRI: 1. Slight enlargement of an acute infarct in the right centrum semiovale. 2. Unchanged size of other patchy evolving right MCA territory infarcts.   10/16/16 Brain MRI: 1. Distal right middle cerebral artery M1 segment occlusion. 2. Multifocal acute ischemia within the right MCA distribution, with the largest area measuring 1.5 cm. 3. No acute hemorrhage or mass effect.   10/15/16 Head CT: 1. Focal white matter hypodensity in the posterior right frontal deep white matter, new since 03/04/2007 brain MRI, potentially representing a subacute white matter infarct. Brain MRI correlation could be obtained as clinically warranted. 2. No mass-effect.  No acute intracranial hemorrhage.    Echo (10/16/16) and TEE (108/18):  Left ventricle: The cavity size was normal. There was moderate focal basal hypertrophy of the septum. LVEF 55 to 60%.  Wall motion was normal; there were no regional wall motion abnormalities. Grade 2 diastolic dysfunction.   10/16/16 Carotid US: Right plaque with 40-59% ICA. Left with soft plaque 1-39% ICA stenosis.    Cardiology consultation, suspicious of cryptogenic stroke. Dr Rayann Heman evaluated her on 10/18/16, recently had loop recorder monitor implant placed.  She has apt with Dr Erlinda Hong, neuro, 12/07/16.  Appt with outpatient PT 11/08/16 but also received a call from [facility] to arrange home PT.  Embolic CVA unknown source. Started on Plavix 75 mg daily and Aspirin 325 mg,the latter one can be discontinued in 3 months. She has not noted gum/nose bleed,gross hematuria,or blood in stool. She is reporting slow improvement in LUE strength.  Right handed.  HTN: Amlodipine was decreased to 2.5 mg.  Denies  severe/frequent headache, visual changes, chest pain, dyspnea, palpitation,or edema. She is not checking BP's at home.   Lab Results  Component Value Date   WBC 7.5 10/20/2016   HGB 11.5 (L) 10/20/2016   HCT 35.0 (L) 10/20/2016   MCV 92.6 10/20/2016   PLT 325 10/20/2016   Lab Results  Component Value Date   CREATININE 0.56 10/19/2016   BUN 16 10/19/2016   NA 137 10/19/2016   K 3.5 10/19/2016   CL 104 10/19/2016   CO2 25 10/19/2016   DM II: Lantus was decreased from 55 U daily to 15 U bid. Metformin was discontinued on admission , she has not resumed it. FG  110's to 130's. Post prandials 160's. She denies hypoglycemic events.   Lab Results  Component Value Date   HGBA1C 12.9 (H) 10/16/2016   Her sister has been in town with her for the past 2 weeks helping with her care, she lives in [place]. She has another sister that lives in same apartment complex and also helping with her care. She lives with her mother. Residual mild dysarthria, completed speech therapy, denies dysphagia. Left side weakness, LUE.  ADL's: She needs assistance for transfer (walker) and for shower she has a shower chair. She is independent for feeding,dressing, and toilet use. She is not driving yet and wonders when she can do so. Denies seizure activity or MS changes.    She was having dental pain, she has not filled Rx for Hydrocodone-Acetaminophen, she is not having pain now.Dental procedure to be scheduled in 3-6 months. She completed Augmentin.    Review of Systems  Constitutional:  Positive for activity change, appetite change and fatigue. Negative for fever and unexpected weight change.  HENT: Negative for mouth sores, nosebleeds, sore throat and trouble swallowing.   Eyes: Negative for redness and visual disturbance.  Respiratory: Negative for cough, shortness of breath and wheezing.   Cardiovascular: Negative for chest pain, palpitations and leg swelling.  Gastrointestinal: Positive for  constipation (Improved with Colace.Last bowel movement  yesterday.). Negative for abdominal pain, nausea and vomiting.  Endocrine: Negative for cold intolerance, heat intolerance, polydipsia, polyphagia and polyuria.  Genitourinary: Negative for decreased urine volume, dysuria and hematuria.  Musculoskeletal: Positive for gait problem. Negative for myalgias.  Skin: Negative for pallor and rash.  Allergic/Immunologic: Positive for environmental allergies.  Neurological: Negative for seizures, syncope and headaches.  Hematological: Negative for adenopathy. Does not bruise/bleed easily.  Psychiatric/Behavioral: Negative for confusion, hallucinations, sleep disturbance and suicidal ideas.      Current Outpatient Prescriptions on File Prior to Visit  Medication Sig Dispense Refill  . amLODipine (NORVASC) 2.5 MG tablet Take 1 tablet (2.5 mg total) by mouth daily. 30 tablet 1  . aspirin EC 325 MG EC tablet Take 1 tablet (325 mg total) by mouth daily. 30 tablet 0  . atorvastatin (LIPITOR) 40 MG tablet Take 1 tablet (40 mg total) by mouth daily. 30 tablet 0  . clopidogrel (PLAVIX) 75 MG tablet Take 1 tablet (75 mg total) by mouth daily. 30 tablet 0  . Insulin Glargine (LANTUS) 100 UNIT/ML Solostar Pen Inject 15 Units into the skin 2 (two) times daily. 15 mL 11  . TRAVATAN Z 0.004 % SOLN ophthalmic solution Place 1 drop into both eyes at bedtime.  11  . Vitamin D, Ergocalciferol, (DRISDOL) 50000 units CAPS capsule TAKE ONE CAPSULE BY MOUTH TWICE A WEEK ON MONDAY AND THURSDAY 8 capsule 2  . albuterol (PROVENTIL HFA;VENTOLIN HFA) 108 (90 Base) MCG/ACT inhaler Inhale 2 puffs into the lungs every 6 (six) hours as needed for wheezing or shortness of breath. 1 Inhaler 0   No current facility-administered medications on file prior to visit.      Past Medical History:  Diagnosis Date  . Boils   . Glaucoma   . Heart murmur   . HTN (hypertension)   . Hx of adenomatous colonic polyps   . Hyperlipidemia    . Osteoporosis   . Pneumonia   . Type II or unspecified type diabetes mellitus without mention of complication, uncontrolled    No Known Allergies  Social History   Social History  . Marital status: Single    Spouse name: N/A  . Number of children: N/A  . Years of education: N/A   Social History Main Topics  . Smoking status: Former Smoker    Types: Cigarettes    Quit date: 10/15/2016  . Smokeless tobacco: Never Used     Comment: smokes occ.   Marland Kitchen Alcohol use 0.0 oz/week     Comment: occ  . Drug use: No  . Sexual activity: Not Asked   Other Topics Concern  . None   Social History Narrative  . None    Vitals:   11/02/16 1207  BP: 130/70  Pulse: 83  Resp: 12  Temp: 98.5 F (36.9 C)  SpO2: 97%   Body mass index is 28.17 kg/m.   Physical Exam  Nursing note and vitals reviewed. Constitutional: She is oriented to person, place, and time. She appears well-developed. No distress.  HENT:  Head: Normocephalic and atraumatic.  Mouth/Throat: Oropharynx is clear and  moist and mucous membranes are normal.  Eyes: Pupils are equal, round, and reactive to light. Conjunctivae are normal.  Neck: No tracheal deviation present. No thyroid mass and no thyromegaly present.  Cardiovascular: Normal rate and regular rhythm.   No murmur heard. Pulses:      Dorsalis pedis pulses are 2+ on the right side, and 2+ on the left side.  Respiratory: Effort normal and breath sounds normal. No respiratory distress.  GI: Soft. She exhibits no mass. There is no tenderness.  Musculoskeletal: She exhibits no edema.  Mildly decreased ROM left shoulder, no pain elicited.  Lymphadenopathy:    She has no cervical adenopathy.  Neurological: She is alert and oriented to person, place, and time. She displays no tremor. No cranial nerve deficit.  Left UE 2-3/5. Rest of extremities 5/5. Gait is stable ,assisted with a walker.    Skin: Skin is warm. No erythema.  Psychiatric: She has a normal mood  and affect.  Well groomed, good eye contact.     ASSESSMENT AND PLAN:   Ms Ester was seen today for hospitalization follow-up.  Diagnoses and all orders for this visit:  Cerebral infarction due to embolism of right middle cerebral artery ([place])  Keep appt with neuro. Some side effects of Plavix and Aspirin discussed. Instructed about warning signs.  Left arm weakness  PT appt as outpt already arranged, pending to evaluate for home PT. Fall precautions discussed. Continue ROM exercises at home while PT starts.  Acute blood loss anemia  Mild anemia. We will re-check next OV.  Hypertension with heart disease  Adequately controlled. No changes in current management. DASH diet recommended.  Uncontrolled type 2 diabetes mellitus with insulin therapy ([place])  BS's she is reporting suggest better controlled DM. For now no changes in current management. We discussed possible complications of poorly controlled diabetes. Eye exam is current, she had to postpone cataract surgery. HgA1C due in 02/2017.  Stenosis of carotid artery, unspecified laterality  Mild right carotid artery stenosis. Continue Plavix and Lipitor. Encouraged to continue tobacco free. Instructed about warning signs.    Riyad Keena G. [name], MD  [facility]. [place] office.

## 2016-11-02 NOTE — Patient Instructions (Addendum)
A few things to remember from today's visit:   Cerebral infarction due to embolism of right middle cerebral artery ([facility])  Acute blood loss anemia  Hypertension with heart disease  Uncontrolled type 2 diabetes mellitus with insulin therapy ([facility])  No changes.  Keep next appt. Pending PT.  Fall precaution.  Please be sure medication list is accurate. If a new problem present, please set up appointment sooner than planned today.

## 2016-11-03 ENCOUNTER — Ambulatory Visit: Payer: Medicare Other | Admitting: Family Medicine

## 2016-11-04 DIAGNOSIS — I69392 Facial weakness following cerebral infarction: Secondary | ICD-10-CM | POA: Diagnosis not present

## 2016-11-04 DIAGNOSIS — J449 Chronic obstructive pulmonary disease, unspecified: Secondary | ICD-10-CM | POA: Diagnosis not present

## 2016-11-04 DIAGNOSIS — R011 Cardiac murmur, unspecified: Secondary | ICD-10-CM | POA: Diagnosis not present

## 2016-11-04 DIAGNOSIS — Z794 Long term (current) use of insulin: Secondary | ICD-10-CM | POA: Diagnosis not present

## 2016-11-04 DIAGNOSIS — H409 Unspecified glaucoma: Secondary | ICD-10-CM | POA: Diagnosis not present

## 2016-11-04 DIAGNOSIS — E785 Hyperlipidemia, unspecified: Secondary | ICD-10-CM | POA: Diagnosis not present

## 2016-11-04 DIAGNOSIS — I10 Essential (primary) hypertension: Secondary | ICD-10-CM | POA: Diagnosis not present

## 2016-11-04 DIAGNOSIS — Z7901 Long term (current) use of anticoagulants: Secondary | ICD-10-CM | POA: Diagnosis not present

## 2016-11-04 DIAGNOSIS — E1142 Type 2 diabetes mellitus with diabetic polyneuropathy: Secondary | ICD-10-CM | POA: Diagnosis not present

## 2016-11-04 DIAGNOSIS — I69354 Hemiplegia and hemiparesis following cerebral infarction affecting left non-dominant side: Secondary | ICD-10-CM | POA: Diagnosis not present

## 2016-11-05 ENCOUNTER — Ambulatory Visit: Payer: Medicare Other | Admitting: Family Medicine

## 2016-11-08 ENCOUNTER — Ambulatory Visit: Payer: Medicare Other | Attending: Physical Medicine & Rehabilitation | Admitting: Physical Therapy

## 2016-11-08 ENCOUNTER — Encounter: Payer: Self-pay | Admitting: Physical Therapy

## 2016-11-08 VITALS — BP 153/81 | HR 76

## 2016-11-08 DIAGNOSIS — M6281 Muscle weakness (generalized): Secondary | ICD-10-CM | POA: Insufficient documentation

## 2016-11-08 DIAGNOSIS — R2689 Other abnormalities of gait and mobility: Secondary | ICD-10-CM | POA: Diagnosis not present

## 2016-11-08 DIAGNOSIS — I69354 Hemiplegia and hemiparesis following cerebral infarction affecting left non-dominant side: Secondary | ICD-10-CM | POA: Diagnosis not present

## 2016-11-08 DIAGNOSIS — R2681 Unsteadiness on feet: Secondary | ICD-10-CM | POA: Diagnosis not present

## 2016-11-08 NOTE — Therapy (Signed)
[facility] [address] [place], [place], [address] Phone: [phone]   Fax:  [phone]  Physical Therapy Evaluation  Patient Details  Name: Sarah [name] MRN: [id number] Date of Birth: 1945-01-13 Referring Provider: Alysia Penna MD  Encounter Date: 11/08/2016      PT End of Session - 11/08/16 1924    Visit Number 1   Number of Visits 17   Date for PT Re-Evaluation 01/07/17   Authorization Type [employer] Medicare   Authorization Time Period 11/08/16  to  01/07/17   PT Start Time 0851   PT Stop Time 0940   PT Time Calculation (min) 49 min   Equipment Utilized During Treatment Gait belt   Activity Tolerance Patient tolerated treatment well   Behavior During Therapy [facility] for tasks assessed/performed      Past Medical History:  Diagnosis Date  . Boils   . Glaucoma   . Heart murmur   . HTN (hypertension)   . Hx of adenomatous colonic polyps   . Hyperlipidemia   . Osteoporosis   . Pneumonia   . Type II or unspecified type diabetes mellitus without mention of complication, uncontrolled     Past Surgical History:  Procedure Laterality Date  . ABDOMINAL HYSTERECTOMY    . CARPAL TUNNEL RELEASE     right  . COLONOSCOPY  12-10-10   per Dr. Deatra Ina, clear, repeat in 7 yrs   . INCISION AND DRAINAGE PERIRECTAL ABSCESS N/A 09/26/2015   Procedure: IRRIGATION AND DEBRIDEMENT PERIRECTAL ABSCESS;  Surgeon: Mickeal Skinner, MD;  Location: [place];  Service: General;  Laterality: N/A;  . KNEE ARTHROSCOPY     right  . LOOP RECORDER INSERTION N/A 10/19/2016   Procedure: LOOP RECORDER INSERTION;  Surgeon: Thompson Grayer, MD;  Location: [place] CV LAB;  Service: Cardiovascular;  Laterality: N/A;  . ORIF ANKLE FRACTURE Right 03/24/2012   Procedure: OPEN REDUCTION INTERNAL FIXATION (ORIF) ANKLE FRACTURE;  Surgeon: Newt Minion, MD;  Location: [place];  Service: Orthopedics;  Laterality: Right;  Open Reduction  Internal Fixation Right Bimalleolar ankle fracture  . POLYPECTOMY    . TEE WITHOUT CARDIOVERSION N/A 10/18/2016   Procedure: TRANSESOPHAGEAL ECHOCARDIOGRAM (TEE);  Surgeon: Fay Records, MD;  Location: [facility] ENDOS[facility];  Service: Cardiovascular;  Laterality: N/A;  . TONSILLECTOMY      Vitals:   11/08/16 0906  BP: (!) 153/81  Pulse: 76         Subjective Assessment - 11/08/16 0858    Subjective Couple of weeks ago I had a stroke. My arm is OK  but I can't close my hand all the way or open it all the way. Using walker to help with walking.    Patient is accompained by: Family member  sister   Pertinent History HTN, HLD, DM, neurop, tobacco, COPD, OP (lives w/ mom; sister came to stay)   Limitations Standing;Walking   How long can you stand comfortably? 15 min   Patient Stated Goals to improve the use of her left hand and walk without a walker   Currently in Pain? No/denies            [facility] PT Assessment - 11/08/16 0001      Assessment   Medical Diagnosis Rt MCA CVA   Referring Provider Alysia Penna MD   Onset Date/Surgical Date 10/15/16   Hand Dominance Right   Prior Therapy CIR     Precautions   Precautions Fall     Balance  Screen   Has the patient fallen in the past 6 months No   Has the patient had a decrease in activity level because of a fear of falling?  Yes   Is the patient reluctant to leave their home because of a fear of falling?  No     Home Ecologist residence   Transport planner;Other (Comment)  sister staying with them most of the time; lives nearby   Available Help at Discharge Family;Available 24 hours/day   Type of Home House   Home Access Stairs to enter   Entrance Stairs-Number of Steps 1   Home Layout Two level;Able to live on main level with bedroom/bathroom   Home Equipment Shower seat;Grab bars - toilet     Prior Function   Level of Independence Independent   Vocation Other (comment)  taking  care of her mother (meds, cooking)   Vocation Requirements stopped working to care for her mom; wants to regain typing to work from home     Cognition   Overall Cognitive Status Within Functional Limits for tasks assessed     Observation/Other Assessments   Focus on Therapeutic Outcomes (FOTO)  FS 49; risk adjusted FS 55     Sensation   Light Touch Appears Intact (bil LEs) denies numbness     Coordination   Gross Motor Movements are Fluid and Coordinated No  LEs   Fine Motor Movements are Fluid and Coordinated No     ROM / Strength   AROM / PROM / Strength AROM;Strength     AROM   Overall AROM  Deficits   Overall AROM Comments bil LEs WFL; LUE hand with limited flexion and extension     Strength   Overall Strength Deficits   Overall Strength Comments Lt hip flexion 4/5, hip abdct 4/5, knee ext 3+, knee flexion 3+, ankle DF 4/5     Transfers   Transfers Sit to Stand;Stand to Sit   Sit to Stand 6: Modified independent (Device/Increase time);With upper extremity assist  to rollator   Stand to Sit 6: Modified independent (Device/Increase time);With upper extremity assist     Ambulation/Gait   Ambulation/Gait Yes   Ambulation/Gait Assistance 4: Min guard   Ambulation Distance (Feet) 40 Feet  30, 20, 30   Assistive device Rollator;None   Gait Pattern Step-through pattern;Decreased step length - right;Decreased step length - left;Decreased dorsiflexion - left;Decreased weight shift to left;Left foot flat   Ambulation Surface Level     Balance   Balance Assessed Yes     Static Sitting Balance   Static Sitting - Balance Support No upper extremity supported;Feet supported   Static Sitting - Level of Assistance 7: Independent     Static Standing Balance   Static Standing - Balance Support No upper extremity supported   Static Standing - Level of Assistance 5: Stand by assistance     Standardized Balance Assessment   Standardized Balance Assessment Timed Up and Go Test      Timed Up and Go Test   Normal TUG (seconds) 30.81  rollator; 19.66 no device            Objective measurements completed on examination: See above findings.                  PT Education - 11/08/16 1923    Education provided Yes   Education Details results of eval and PT POC; difference in PT and OT; gentle stretch of Lt  finger extensors (hand flat on her left thigh, prayer position at hands)   Person(s) Educated Patient;Other (comment)   Methods Explanation   Comprehension Verbalized understanding          PT Short Term Goals - 11/08/16 1935      PT SHORT TERM GOAL #1   Title Assess gait velocity and set STG and LTG as appropriate. (Target all STGs 12/08/16)   Time 1   Period Weeks   Status New   Target Date 12/08/16     PT SHORT TERM GOAL #2   Title Patient will be independent with HEP for balance, strength, and gait training.    Time 4   Period Weeks   Status New     PT SHORT TERM GOAL #3   Title Patient will ambulate 250 ft on level/unlevel exterior surfaces with LRAD modified independent.    Time 4   Period Weeks   Status New     PT SHORT TERM GOAL #4   Title Patient will improve TUG to <15 seconds with LRAD.    Time 8   Period Weeks   Status New           PT Long Term Goals - 11/08/16 1940      PT LONG TERM GOAL #1   Title Patient will be independent with updated HEP for balance, strength and ambulation.   Time 8   Period Weeks   Status New     PT LONG TERM GOAL #2   Title Patient will verbalize her plan for continued community-based activity plan to assist with reducing stroke risk factors.   Time 8   Period Weeks   Status New     PT LONG TERM GOAL #3   Title Patient will ambulate 1000 ft on outside surfaces with LRAD at modified independent level   Time 8   Period Weeks   Status New     PT LONG TERM GOAL #4   Title Patient will improve TUG to <13.5 seconds with LRAD.    Time 8   Period Weeks   Status New                 Plan - 11/08/16 1926    Clinical Impression Statement Patient presents s/p Rt MCA CVA 10/15/16 with recent discharge home from inpatient rehab. She was completely independent and caring for her mother prior to CVA and is currently walking with a rollator due to poor balance. Patient is motivated to work with PT to address the deficits listed below via the interventions listed below.    History and Personal Factors relevant to plan of care: HTN, HLD, DM, neuropathy, tobacco, COPD, osteoporosis   Clinical Presentation Evolving   Clinical Presentation due to: acute CVA (<6 months since CVA)   Clinical Decision Making Moderate   Rehab Potential Good   PT Frequency 2x / week   PT Duration 8 weeks   PT Treatment/Interventions ADLs/Self Care Home Management;DME Instruction;Gait training;Stair training;Functional mobility training;Orthotic Fit/Training;Patient/family education;Neuromuscular re-education;Balance training;Therapeutic exercise;Therapeutic activities;Passive range of motion   PT Next Visit Plan Measure gait velocity with and without rollator; initiate HEP for balance, LE strength   Recommended Other Services OT Consult (will request order)   Consulted and Agree with Plan of Care Patient;Family member/caregiver   Family Member Consulted sister      Patient will benefit from skilled therapeutic intervention in order to improve the following deficits and impairments:  Decreased balance, Decreased knowledge of use  of DME, Decreased mobility, Decreased strength, Difficulty walking, Impaired UE functional use, Impaired vision/preception, Obesity  Visit Diagnosis: Hemiplegia and hemiparesis following cerebral infarction affecting left non-dominant side ([place]) - Plan: PT plan of care cert/re-cert  Muscle weakness (generalized) - Plan: PT plan of care cert/re-cert  Other abnormalities of gait and mobility - Plan: PT plan of care cert/re-cert  Unsteadiness on feet - Plan:  PT plan of care cert/re-cert      G-Codes - 76/81/15 1945    Functional Assessment Tool Used (Outpatient Only) TUG norrmal: 30.81 sec (with rollator)   Functional Limitation Mobility: Walking and moving around   Mobility: Walking and Moving Around Current Status ([id number]) At least 20 percent but less than 40 percent impaired, limited or restricted   Mobility: Walking and Moving Around Goal Status [phone]) At least 1 percent but less than 20 percent impaired, limited or restricted       Problem List Patient Active Problem List   Diagnosis Date Noted  . Carotid artery stenosis 11/02/2016  . Right middle cerebral artery stroke ([place]) 10/19/2016  . Left arm weakness   . Diastolic dysfunction   . Hypertension with heart disease   . Tobacco abuse   . Acute blood loss anemia   . Cerebral infarction due to embolism of right middle cerebral artery ([place])   . Stroke (cerebrum) ([place]) 10/15/2016  . Perirectal abscess 09/26/2015  . Vitamin D deficiency 07/23/2015  . Osteopenia 02/16/2013  . Ankle fracture 01/19/2013  . Anemia 09/27/2011  . Uncontrolled type 2 diabetes mellitus with insulin therapy ([place]) 09/24/2011  . Hyperlipidemia 09/24/2011  . Hypertension 09/24/2011    Rexanne Mano, PT 11/08/2016, 7:51 PM  [facility] [address] [place], [place], [address] Phone: [phone]   Fax:  [phone]  Name: YESICA KEMLER MRN: [id number] Date of Birth: March 28, 1945

## 2016-11-09 ENCOUNTER — Telehealth: Payer: Self-pay | Admitting: *Deleted

## 2016-11-09 DIAGNOSIS — R29898 Other symptoms and signs involving the musculoskeletal system: Secondary | ICD-10-CM

## 2016-11-09 DIAGNOSIS — I63411 Cerebral infarction due to embolism of right middle cerebral artery: Secondary | ICD-10-CM

## 2016-11-09 NOTE — Telephone Encounter (Signed)
Orders placed.

## 2016-11-09 NOTE — Telephone Encounter (Signed)
-----   Message from Charlett Blake, MD sent at 11/09/2016  9:10 AM EDT ----- Please place OT order for outpatient ----- Message ----- From: Rexanne Mano, PT Sent: 11/08/2016   7:52 PM To: Charlett Blake, MD  Dr. Letta Pate, Thank you for referring Sarah [name] for [employer]. She also has significant UE involvement and noted on OT discharge summary from CIR, OP OT was recommended.   Please order OPOT evaluation if you agree.  Thank you,    Barry Brunner, PT Outpatient Neuro[facility]habilitation [address], [place], [place] [address] [phone]

## 2016-11-11 ENCOUNTER — Encounter: Payer: Medicare Other | Attending: Physical Medicine & Rehabilitation

## 2016-11-11 ENCOUNTER — Encounter: Payer: Self-pay | Admitting: Physical Medicine & Rehabilitation

## 2016-11-11 ENCOUNTER — Ambulatory Visit (HOSPITAL_BASED_OUTPATIENT_CLINIC_OR_DEPARTMENT_OTHER): Payer: Medicare Other | Admitting: Physical Medicine & Rehabilitation

## 2016-11-11 VITALS — BP 156/79 | HR 78 | Resp 14

## 2016-11-11 DIAGNOSIS — R269 Unspecified abnormalities of gait and mobility: Secondary | ICD-10-CM | POA: Diagnosis not present

## 2016-11-11 DIAGNOSIS — I69354 Hemiplegia and hemiparesis following cerebral infarction affecting left non-dominant side: Secondary | ICD-10-CM

## 2016-11-11 DIAGNOSIS — I69322 Dysarthria following cerebral infarction: Secondary | ICD-10-CM | POA: Insufficient documentation

## 2016-11-11 DIAGNOSIS — Z720 Tobacco use: Secondary | ICD-10-CM | POA: Insufficient documentation

## 2016-11-11 DIAGNOSIS — I10 Essential (primary) hypertension: Secondary | ICD-10-CM | POA: Insufficient documentation

## 2016-11-11 DIAGNOSIS — I69398 Other sequelae of cerebral infarction: Secondary | ICD-10-CM | POA: Insufficient documentation

## 2016-11-11 DIAGNOSIS — E119 Type 2 diabetes mellitus without complications: Secondary | ICD-10-CM | POA: Insufficient documentation

## 2016-11-11 DIAGNOSIS — E785 Hyperlipidemia, unspecified: Secondary | ICD-10-CM | POA: Diagnosis not present

## 2016-11-11 NOTE — Patient Instructions (Signed)
No driving until next visit  Wear Oven Mitt on your right hand for about 2 hours a day to force yourself to use the left hand  Please let me know if you would like an order for speech therapy, it may help with increasing the speed of your speech

## 2016-11-11 NOTE — Progress Notes (Signed)
Subjective:    Patient ID: Sarah [name], female    DOB: 10-02-1945, 71 y.o.   MRN: [id number] 71 year old right-handed female with history of hypertension, hyperlipidemia, diabetes mellitus, tobacco abuse, who presented on October 15, 2016, with left-sided weakness and facial droop.  She lives with her elderly mother independent prior to admission.  Cranial CT scan completed showing right frontal infarction. MRI, MRA showed no acute hemorrhage or mass effect.  Multifocal ischemia in right MCA distribution.  The patient did not receive tPA. Echocardiogram with ejection fraction of 40%, grade 2 diastolic dysfunction.  Carotid Dopplers with 40-59% right ICA stenosis.  CT angiogram of the neck showed no emboli.  Neurology consulted, placed on aspirin and Plavix therapy.  TEE showed no PFO without masses.  A loop recorder was placed. HPI Patient follows up after her rehab hospitalization at [facility] Patient was on my stroke rehabilitation service DATE OF ADMISSION:  10/20/2016 DATE OF DISCHARGE:  10/28/2016  She was discharged to home where she stays with her family helping her.  She is independent with bathing and dressing She has started outpatient physical therapy and orders were placed yesterday from this office to start outpatient OT. Because of Medicare regulations she cannot also get home health at the same time.  She denies any falls at home. Driving prior to her stroke, was planning cataract removal however she is putting off surgery because of her stroke      Pain Inventory Average Pain 0 Pain Right Now 0 My pain is no pain  In the last 24 hours, has pain interfered with the following? General activity 0 Relation with others 0 Enjoyment of life 0 What TIME of day is your pain at its worst? no pain Sleep (in general) Good  Pain is worse with: no pain Pain improves with: no pain Relief from Meds: no pain  Mobility walk with assistance use a  walker ability to climb steps?  yes do you drive?  no  Function retired  Neuro/Psych weakness numbness tingling trouble walking  Prior Studies hospital f/u  Physicians involved in your care hospital f/u   Family History  Problem Relation Age of Onset  . Diabetes Mother   . Hypertension Mother   . Stroke Father   . Colon cancer Neg Hx   . Esophageal cancer Neg Hx   . Rectal cancer Neg Hx   . Stomach cancer Neg Hx    Social History   Social History  . Marital status: Single    Spouse name: N/A  . Number of children: N/A  . Years of education: N/A   Social History Main Topics  . Smoking status: Former Smoker    Types: Cigarettes    Quit date: 10/15/2016  . Smokeless tobacco: Never Used     Comment: smokes occ.   Marland Kitchen Alcohol use 0.0 oz/week     Comment: occ  . Drug use: No  . Sexual activity: Not Asked   Other Topics Concern  . None   Social History Narrative  . None   Past Surgical History:  Procedure Laterality Date  . ABDOMINAL HYSTERECTOMY    . CARPAL TUNNEL RELEASE     right  . COLONOSCOPY  12-10-10   per Dr. Deatra Ina, clear, repeat in 7 yrs   . INCISION AND DRAINAGE PERIRECTAL ABSCESS N/A 09/26/2015   Procedure: IRRIGATION AND DEBRIDEMENT PERIRECTAL ABSCESS;  Surgeon: Mickeal Skinner, MD;  Location: [place];  Service: General;  Laterality: N/A;  .  KNEE ARTHROSCOPY     right  . LOOP RECORDER INSERTION N/A 10/19/2016   Procedure: LOOP RECORDER INSERTION;  Surgeon: Thompson Grayer, MD;  Location: [place] CV LAB;  Service: Cardiovascular;  Laterality: N/A;  . ORIF ANKLE FRACTURE Right 03/24/2012   Procedure: OPEN REDUCTION INTERNAL FIXATION (ORIF) ANKLE FRACTURE;  Surgeon: Newt Minion, MD;  Location: [place];  Service: Orthopedics;  Laterality: Right;  Open Reduction Internal Fixation Right Bimalleolar ankle fracture  . POLYPECTOMY    . TEE WITHOUT CARDIOVERSION N/A 10/18/2016   Procedure: TRANSESOPHAGEAL ECHOCARDIOGRAM (TEE);  Surgeon: Fay Records,  MD;  Location: [place];  Service: Cardiovascular;  Laterality: N/A;  . TONSILLECTOMY     Past Medical History:  Diagnosis Date  . Boils   . Glaucoma   . Heart murmur   . HTN (hypertension)   . Hx of adenomatous colonic polyps   . Hyperlipidemia   . Osteoporosis   . Pneumonia   . Type II or unspecified type diabetes mellitus without mention of complication, uncontrolled    There were no vitals taken for this visit.  Opioid Risk Score:   Fall Risk Score:  `1  Depression screen PHQ 2/9  Depression screen [facility] 2/9 11/11/2016 09/06/2014 03/27/2013 01/19/2013  Decreased Interest 0 0 0 0  Down, Depressed, Hopeless 1 0 0 0  PHQ - 2 Score 1 0 0 0  Altered sleeping 0 - - -  Tired, decreased energy 1 - - -  Change in appetite 0 - - -  Feeling bad or failure about yourself  0 - - -  Trouble concentrating 1 - - -  Moving slowly or fidgety/restless 1 - - -  Suicidal thoughts 0 - - -  PHQ-9 Score 4 - - -  Difficult doing work/chores Somewhat difficult - - -    Review of Systems  HENT: Negative.   Eyes: Negative.   Respiratory: Negative.   Cardiovascular: Negative.   Gastrointestinal: Negative.   Endocrine: Negative.   Genitourinary: Negative.   Musculoskeletal: Positive for gait problem.  Skin: Negative.   Allergic/Immunologic: Negative.   Neurological: Positive for weakness and numbness.       Tingling   Hematological: Negative.   Psychiatric/Behavioral: Negative.   All other systems reviewed and are negative.      Objective:   Physical Exam  Constitutional: She is oriented to person, place, and time. She appears well-developed and well-nourished. No distress.  HENT:  Head: Normocephalic and atraumatic.  Eyes: Pupils are equal, round, and reactive to light. Conjunctivae and EOM are normal.  Neck: Normal range of motion.  Cardiovascular: Normal rate, regular rhythm and normal heart sounds.   No murmur heard. Pulmonary/Chest: Effort normal and breath sounds normal. No  respiratory distress. She has no wheezes.  Abdominal: Soft. Bowel sounds are normal. She exhibits distension. There is no tenderness.  Neurological: She is alert and oriented to person, place, and time. Coordination and gait abnormal.  Ambulates with a rolling walker no evidence of toe drag or knee instability. Decreased fine motor left upper extremity slowed position.  Skin: Skin is warm and dry. She is not diaphoretic.  Psychiatric: She has a normal mood and affect.  Nursing note and vitals reviewed.   Sensation to pinprick is equal bilateral upper limbs as well as in the facial area. Motor strength is 5/5 in the right deltoid, bicep, tricep, grip dorsiflexor 3/5 in the left deltoid, bicep, tricep, grip 4/5 in the left hip flexor, knee extensor,  ankle dorsiflexor       Assessment & Plan:  1.  Right infarct with residual right upper extremity greater than right lower extremity weakness.  Patient is making a good recovery, recommend outpatient PT regularly outpatient OT. We discussed forced use of left upper limb by wearing oven mitt right upper 2.  Dysarthria secondary to stroke, mild, discussed possibility of outpatient speech, patient wishes to hold off for now 3.  Possible embolic stroke follow-up with cardiology to evaluate recordings from loop recorder Follow-up with neurology Continue dual antiplatelet therapy for at least 3 months post stroke

## 2016-11-16 ENCOUNTER — Ambulatory Visit: Payer: Medicare Other | Attending: Physical Medicine & Rehabilitation | Admitting: Physical Therapy

## 2016-11-16 ENCOUNTER — Encounter: Payer: Self-pay | Admitting: Physical Therapy

## 2016-11-16 DIAGNOSIS — R270 Ataxia, unspecified: Secondary | ICD-10-CM | POA: Diagnosis not present

## 2016-11-16 DIAGNOSIS — I69318 Other symptoms and signs involving cognitive functions following cerebral infarction: Secondary | ICD-10-CM | POA: Diagnosis not present

## 2016-11-16 DIAGNOSIS — R2689 Other abnormalities of gait and mobility: Secondary | ICD-10-CM | POA: Insufficient documentation

## 2016-11-16 DIAGNOSIS — M6281 Muscle weakness (generalized): Secondary | ICD-10-CM | POA: Diagnosis not present

## 2016-11-16 DIAGNOSIS — R41842 Visuospatial deficit: Secondary | ICD-10-CM | POA: Diagnosis present

## 2016-11-16 DIAGNOSIS — R278 Other lack of coordination: Secondary | ICD-10-CM | POA: Diagnosis not present

## 2016-11-16 DIAGNOSIS — I69354 Hemiplegia and hemiparesis following cerebral infarction affecting left non-dominant side: Secondary | ICD-10-CM | POA: Insufficient documentation

## 2016-11-16 DIAGNOSIS — R2681 Unsteadiness on feet: Secondary | ICD-10-CM | POA: Insufficient documentation

## 2016-11-16 NOTE — Patient Instructions (Addendum)
Hip Abduction (Standing)    Stand at counter with support. Lift right leg out to side, keeping toe forward. Hold for __5_ seconds.  Repeat _10__ times. Do _1-2__ times a day. Repeat with other leg.    Copyright  VHI. All rights reserved.  Hip Extension (Standing)    Stand with support. Squeeze pelvic floor and hold. Move right leg backward with straight knee. Hold for _5__ seconds. Repeat _10__ times. Do __1-2_ times a day. Repeat with other leg.    Copyright  VHI. All rights reserved.      TOES RAISES   In front of counter top, In a standing position with your feet on the ground, raise up your toes as you bend at your ankle.    Hold for _5__ seconds. Repeat _10__ times. Do __1-2_ times a day.    STANDING HEEL RAISES  While standing at counter, raise up on your toes as you lift your heels off the ground. Hold for _5__ seconds. Repeat _10__ times. Do __1-2_ times a day.  Single Leg - Eyes Open    At counter for support, lift right leg, bending the knee, while maintaining balance over other leg. Hold__5__ seconds. Repeat __10__ times per session. Do __1-2__ sessions per day.  Copyright  VHI. All rights reserved.  FUNCTIONAL MOBILITY: Marching - Standing    Standing at counter with one hand for support. March in place by lifting left leg up, then right. Alternate. __10_ reps per set, Hold for 5 secs, _1-2__ sets per day.  Copyright  VHI. All rights reserved.

## 2016-11-16 NOTE — Therapy (Signed)
[facility] [address] [place], [place], [address] Phone: [phone]   Fax:  [phone]  Physical Therapy Treatment  Patient Details  Name: Sarah [name] MRN: [id number] Date of Birth: 1945/01/28 Referring Provider: Alysia Penna MD   Encounter Date: 11/16/2016  PT End of Session - 11/16/16 1108    Visit Number  2    Number of Visits  17    Date for PT Re-Evaluation  01/07/17    Authorization Type  [employer] Medicare    Authorization Time Period  11/08/16  to  01/07/17    PT Start Time  1103    PT Stop Time  1147    PT Time Calculation (min)  44 min    Equipment Utilized During Treatment  Gait belt    Activity Tolerance  Patient tolerated treatment well    Behavior During Therapy  [facility] for tasks assessed/performed       Past Medical History:  Diagnosis Date  . Boils   . Glaucoma   . Heart murmur   . HTN (hypertension)   . Hx of adenomatous colonic polyps   . Hyperlipidemia   . Osteoporosis   . Pneumonia   . Type II or unspecified type diabetes mellitus without mention of complication, uncontrolled     Past Surgical History:  Procedure Laterality Date  . ABDOMINAL HYSTERECTOMY    . CARPAL TUNNEL RELEASE     right  . COLONOSCOPY  12-10-10   per Dr. Deatra Ina, clear, repeat in 7 yrs   . KNEE ARTHROSCOPY     right  . POLYPECTOMY    . TONSILLECTOMY      There were no vitals filed for this visit.  Subjective Assessment - 11/16/16 1106    Subjective  No new complaints, no falls or pain to report. Pt recieved the order for OT and will schedule appt after this session.     Patient is accompained by:  Family member    Pertinent History  HTN, HLD, DM, neurop, tobacco, COPD, OP (lives w/ mom; sister came to stay)    Limitations  Standing;Walking    How long can you stand comfortably?  15 min    Patient Stated Goals  to improve the use of her left hand and walk without a walker    Currently in Pain?   No/denies       [facility] Adult PT Treatment/Exercise - 11/16/16 0001      Ambulation/Gait   Gait velocity  18.16= 1.8 ft/sec without rollator, 18.53= 1.77 ft/sec with rollator   without rollator, 18.53= 1.77 ft/sec with rollator     Neuro Re-ed    Neuro Re-ed Details   Pt in corner standing on pillow with chair in front for support as needed. Pt performing head movements/EO: head turns/head nods/diagonals both ways. Pt presented with posterior lean and right lateral lean with left diagonals. Pt min to mod assist with this task.       Exercises   Exercises  Knee/Hip;Ankle      Knee/Hip Exercises: Standing   Heel Raises  Both;10 reps;5 seconds    Heel Raises Limitations  Pt standing at counter with BUE support with cues for initial instruction, posture, and slow descent.     Hip Abduction  Both;10 reps;Knee straight;AROM 5 sec hold   5 sec hold   Abduction Limitations  Pt standing in front of counter with BUE support, pt required cue to avoid lateral lean when lifting leg.  Hip Extension  AROM;Both;10 reps;Knee straight 5 sec hold   5 sec hold   Extension Limitations  Pt standing at counter with BUE support, cues for posture, to clear foot all the way off the floor, and to keep toes rotated forward.     SLS   at counter for BUE support, pt bending at the knee and holding for 5 secs x10 reps alternating LE with each rep. Pt required cue for posture during exercise.     Other Standing Knee Exercises  Pt standing at counter with BUE support performing toe raises x10 reps with 5sec holds. Pt required cues on posture and to bring toes up higher.     Other Standing Knee Exercises  Standing marches at counter with single UE support,       Knee/Hip Exercises: Seated   Marching  AROM;Both;10 reps;Limitations    Marching Limitations  Pt standing at counter with single UE support performing alt. marches x10 reps, pt tolerated this task well but demonstrated some fatigue following exercise.         PT Education - 11/16/16 1226    Education provided  Yes    Education Details  Created HEP, pt demonstrated each exercise and feels comfortable doing each one at home.     Person(s) Educated  Patient    Methods  Explanation;Demonstration;Handout    Comprehension  Verbalized understanding;Returned demonstration       PT Short Term Goals - 11/16/16 1107      PT SHORT TERM GOAL #1   Title  Assess gait velocity and set STG and LTG as appropriate. (Target all STGs 12/08/16)    Baseline  11/16/16: met today with PT to set goals    Time  --    Period  --    Status  Achieved      PT SHORT TERM GOAL #2   Title  Patient will be independent with HEP for balance, strength, and gait training.     Time  4    Period  Weeks    Status  On-going      PT SHORT TERM GOAL #3   Title  Patient will ambulate 250 ft on level/unlevel exterior surfaces with LRAD modified independent.     Time  4    Period  Weeks    Status  On-going      PT SHORT TERM GOAL #4   Title  Patient will improve TUG to <15 seconds with LRAD.     Time  8    Period  Weeks    Status  On-going        PT Long Term Goals - 11/16/16 1107      PT LONG TERM GOAL #1   Title  Patient will be independent with updated HEP for balance, strength and ambulation.    Time  8    Period  Weeks    Status  New      PT LONG TERM GOAL #2   Title  Patient will verbalize her plan for continued community-based activity plan to assist with reducing stroke risk factors.    Time  8    Period  Weeks    Status  New      PT LONG TERM GOAL #3   Title  Patient will ambulate 1000 ft on outside surfaces with LRAD at modified independent level    Time  8    Period  Weeks    Status  New  PT LONG TERM GOAL #4   Title  Patient will improve TUG to <13.5 seconds with LRAD.     Time  8    Period  Weeks    Status  New        Plan - 11/16/16 1227    Clinical Impression Statement  Pt tolerated treatment well with no limitation to pain  and some limitation due to fatigue. Todays session focused on creating a home exercise plan for balance and LE strengthening. Pt would benefit from continued PT sessions.     Rehab Potential  Good    PT Frequency  2x / week    PT Duration  8 weeks    PT Treatment/Interventions  ADLs/Self Care Home Management;DME Instruction;Gait training;Stair training;Functional mobility training;Orthotic Fit/Training;Patient/family education;Neuromuscular re-education;Balance training;Therapeutic exercise;Therapeutic activities;Passive range of motion    PT Next Visit Plan  Continue with LE strength and balance training.     Consulted and Agree with Plan of Care  Patient;Family member/caregiver    Family Member Consulted  sister       Patient will benefit from skilled therapeutic intervention in order to improve the following deficits and impairments:  Decreased balance, Decreased knowledge of use of DME, Decreased mobility, Decreased strength, Difficulty walking, Impaired UE functional use, Impaired vision/preception, Obesity  Visit Diagnosis: Hemiplegia and hemiparesis following cerebral infarction affecting left non-dominant side (HCC)  Muscle weakness (generalized)  Other abnormalities of gait and mobility  Unsteadiness on feet     Problem List Patient Active Problem List   Diagnosis Date Noted  . Hemiparesis affecting left side as late effect of cerebrovascular accident (CVA) ([place]) 11/11/2016  . Gait disturbance, post-stroke 11/11/2016  . Dysarthria as late effect of stroke 11/11/2016  . Carotid artery stenosis 11/02/2016  . Right middle cerebral artery stroke ([place]) 10/19/2016  . Left arm weakness   . Diastolic dysfunction   . Hypertension with heart disease   . Tobacco abuse   . Acute blood loss anemia   . Cerebral infarction due to embolism of right middle cerebral artery ([place])   . Stroke (cerebrum) ([place]) 10/15/2016  . Perirectal abscess 09/26/2015  . Vitamin D deficiency 07/23/2015   . Osteopenia 02/16/2013  . Ankle fracture 01/19/2013  . Anemia 09/27/2011  . Uncontrolled type 2 diabetes mellitus with insulin therapy ([place]) 09/24/2011  . Hyperlipidemia 09/24/2011  . Hypertension 09/24/2011   Mecca Guitron, SPTA  [place] Gluth 11/16/2016, 12:29 PM  [facility] [address] [place], [place], [address] Phone: [phone]   Fax:  [phone]  Name: Sarah [name] MRN: [id number] Date of Birth: 1945-12-31  This note has been reviewed and edited by supervising CI. Updated STGs.   Willow Ora, PTA, [facility] [address], [employer] [place], [place] [address] [phone] 11/16/16, 1:33 PM

## 2016-11-18 ENCOUNTER — Ambulatory Visit (INDEPENDENT_AMBULATORY_CARE_PROVIDER_SITE_OTHER): Payer: Medicare Other | Admitting: *Deleted

## 2016-11-18 DIAGNOSIS — I639 Cerebral infarction, unspecified: Secondary | ICD-10-CM

## 2016-11-19 ENCOUNTER — Ambulatory Visit: Payer: Medicare Other | Admitting: Physical Therapy

## 2016-11-19 DIAGNOSIS — I69354 Hemiplegia and hemiparesis following cerebral infarction affecting left non-dominant side: Secondary | ICD-10-CM

## 2016-11-19 DIAGNOSIS — M6281 Muscle weakness (generalized): Secondary | ICD-10-CM | POA: Diagnosis not present

## 2016-11-19 DIAGNOSIS — I69318 Other symptoms and signs involving cognitive functions following cerebral infarction: Secondary | ICD-10-CM | POA: Diagnosis not present

## 2016-11-19 DIAGNOSIS — R278 Other lack of coordination: Secondary | ICD-10-CM | POA: Diagnosis not present

## 2016-11-19 DIAGNOSIS — R2681 Unsteadiness on feet: Secondary | ICD-10-CM

## 2016-11-19 DIAGNOSIS — R2689 Other abnormalities of gait and mobility: Secondary | ICD-10-CM

## 2016-11-19 DIAGNOSIS — R270 Ataxia, unspecified: Secondary | ICD-10-CM | POA: Diagnosis not present

## 2016-11-19 NOTE — Therapy (Signed)
[facility] [address] [place], [place], [address] Phone: [phone]   Fax:  [phone]  Physical Therapy Treatment  Patient Details  Name: Sarah [name] MRN: [id number] Date of Birth: Jan 16, 1945 Referring Provider: Alysia Penna MD   Encounter Date: 11/19/2016  PT End of Session - 11/19/16 0934    Visit Number  3    Number of Visits  17    Date for PT Re-Evaluation  01/07/17    Authorization Type  [employer] Medicare    Authorization Time Period  11/08/16  to  01/07/17    PT Start Time  0847    PT Stop Time  0930    PT Time Calculation (min)  43 min    Equipment Utilized During Treatment  Gait belt    Activity Tolerance  Patient tolerated treatment well    Behavior During Therapy  [facility] for tasks assessed/performed       Past Medical History:  Diagnosis Date  . Boils   . Glaucoma   . Heart murmur   . HTN (hypertension)   . Hx of adenomatous colonic polyps   . Hyperlipidemia   . Osteoporosis   . Pneumonia   . Type II or unspecified type diabetes mellitus without mention of complication, uncontrolled     Past Surgical History:  Procedure Laterality Date  . ABDOMINAL HYSTERECTOMY    . CARPAL TUNNEL RELEASE     right  . COLONOSCOPY  12-10-10   per Dr. Deatra Ina, clear, repeat in 7 yrs   . KNEE ARTHROSCOPY     right  . POLYPECTOMY    . TONSILLECTOMY      There were no vitals filed for this visit.  Subjective Assessment - 11/19/16 0935    Subjective  No falls or pain to report. Pt states she does not always use her rollator at home. Pt reports that her right ankle hurts sometimes from a previous injury.    Patient is accompained by:  Family member    Pertinent History  HTN, HLD, DM, neurop, tobacco, COPD, OP (lives w/ mom; sister came to stay)    Limitations  Standing;Walking    How long can you stand comfortably?  15 min    Patient Stated Goals  to improve the use of her left hand and walk  without a walker    Currently in Pain?  No/denies                      [facility] Adult PT Treatment/Exercise - 11/19/16 0001      Ambulation/Gait   Stairs  Yes    Stairs Assistance  5: Supervision;4: Min guard    Stairs Assistance Details (indicate cue type and reason)  Pt hesitant on stairs. VC and visual cues needed for proper sequencing. Supervision given for ascent, Min Guard for descent.    Stair Management Technique  One rail Right;One rail Left;Alternating pattern;Step to pattern;Forwards    Number of Stairs  16    Height of Stairs  6    Gait Comments  Discomfort in Rt ankle (previous injury) after multiple reps on stairs.      Neuro Re-ed    Neuro Re-ed Details   Pt in corner standing on level surface and then on pillow with chair in front for support as needed. Pt performing on bilateral LE and SLS, head movements/EO then EC: head turns/head nods both ways. Pt presented with occasional LOB with head nods and  SLS. Pt min guard to min assist with this task.     Pt. in parallel bars on blue foam balance beam performing weight shift with R LE step-off, then alternate L LE step-off, forward then backwards, x8 each direction. UE reaching added to increase balance challenge. Pt min guard to min assist for support to increase safety.      Exercises   Exercises  Knee/Hip      Knee/Hip Exercises: Standing   Heel Raises  Both;3 seconds;10 reps;1 set    Heel Raises Limitations  Pt standing at counter with BUE support with cues for initial instruction, posture, and slow descent.     Hip Flexion  Right;Left;1 set;10 reps;Knee straight    Hip Flexion Limitations  Pt standing parallel to counter holding with single UE support, cues needed for posture and technique.    Hip Abduction  Right;Left;1 set;10 reps    Abduction Limitations  Pt standing in front of counter with BUE support, Min VC's needed for form.    Hip Extension  Right;Left;Stengthening;1 set;10 reps    Extension  Limitations  Pt standing at counter with BUE support. Min VC's for posture, and to keep toes rotated forward.     SLS  At counter for BUE support, pt flexing knee and holding for 5 secs x10 reps alternating LE with each rep. Pt required cues for posture during exercise for proper form.     Other Standing Knee Exercises  Pt standing at counter with BUE support performing L LE heel raises x10 reps with 2 sec holds. VC's and demonstration needed for proper technique.                PT Short Term Goals - 11/16/16 1107      PT SHORT TERM GOAL #1   Title  Assess gait velocity and set STG and LTG as appropriate. (Target all STGs 12/08/16)    Baseline  11/16/16: met today with PT to set goals    Time  --    Period  --    Status  Achieved      PT SHORT TERM GOAL #2   Title  Patient will be independent with HEP for balance, strength, and gait training.     Time  4    Period  Weeks    Status  On-going      PT SHORT TERM GOAL #3   Title  Patient will ambulate 250 ft on level/unlevel exterior surfaces with LRAD modified independent.     Time  4    Period  Weeks    Status  On-going      PT SHORT TERM GOAL #4   Title  Patient will improve TUG to <15 seconds with LRAD.     Time  8    Period  Weeks    Status  On-going        PT Long Term Goals - 11/16/16 1107      PT LONG TERM GOAL #1   Title  Patient will be independent with updated HEP for balance, strength and ambulation.    Time  8    Period  Weeks    Status  New      PT LONG TERM GOAL #2   Title  Patient will verbalize her plan for continued community-based activity plan to assist with reducing stroke risk factors.    Time  8    Period  Weeks    Status  New  PT LONG TERM GOAL #3   Title  Patient will ambulate 1000 ft on outside surfaces with LRAD at modified independent level    Time  8    Period  Weeks    Status  New      PT LONG TERM GOAL #4   Title  Patient will improve TUG to <13.5 seconds with LRAD.      Time  8    Period  Weeks    Status  New            Plan - 11/19/16 0940    Clinical Impression Statement  Reviewed HEP with patient, performed several balance activities, and educated on proper stair training technique using single handrails. Pt tolerated treatment well, and will benefit from continued PT to progress LE strengthening and endurance to improve balance and safety and overall functional independence.     Rehab Potential  Good    PT Frequency  2x / week    PT Duration  8 weeks    PT Treatment/Interventions  ADLs/Self Care Home Management;DME Instruction;Gait training;Stair training;Functional mobility training;Orthotic Fit/Training;Patient/family education;Neuromuscular re-education;Balance training;Therapeutic exercise;Therapeutic activities;Passive range of motion    PT Next Visit Plan  Balance, LE strengthening, stair training with L or R handrail, progress to gait with cane.    Consulted and Agree with Plan of Care  Patient;Family member/caregiver    Family Member Consulted  sister       Patient will benefit from skilled therapeutic intervention in order to improve the following deficits and impairments:  Decreased balance, Decreased knowledge of use of DME, Decreased mobility, Decreased strength, Difficulty walking, Impaired UE functional use, Impaired vision/preception, Obesity  Visit Diagnosis: Hemiplegia and hemiparesis following cerebral infarction affecting left non-dominant side (HCC)  Muscle weakness (generalized)  Other abnormalities of gait and mobility  Unsteadiness on feet     Problem List Patient Active Problem List   Diagnosis Date Noted  . Hemiparesis affecting left side as late effect of cerebrovascular accident (CVA) ([place]) 11/11/2016  . Gait disturbance, post-stroke 11/11/2016  . Dysarthria as late effect of stroke 11/11/2016  . Carotid artery stenosis 11/02/2016  . Right middle cerebral artery stroke ([place]) 10/19/2016  . Left arm weakness    . Diastolic dysfunction   . Hypertension with heart disease   . Tobacco abuse   . Acute blood loss anemia   . Cerebral infarction due to embolism of right middle cerebral artery ([place])   . Stroke (cerebrum) ([place]) 10/15/2016  . Perirectal abscess 09/26/2015  . Vitamin D deficiency 07/23/2015  . Osteopenia 02/16/2013  . Ankle fracture 01/19/2013  . Anemia 09/27/2011  . Uncontrolled type 2 diabetes mellitus with insulin therapy ([place]) 09/24/2011  . Hyperlipidemia 09/24/2011  . Hypertension 09/24/2011    Andria Meuse, SPTA 11/19/2016, 11:09 AM  [facility] [address] [place], [place], [address] Phone: [phone]   Fax:  [phone]  Name: Sarah [name] MRN: [id number] Date of Birth: [birth year]

## 2016-11-19 NOTE — Progress Notes (Signed)
[facility] Summary Report / Loop Recorder 

## 2016-11-25 ENCOUNTER — Ambulatory Visit: Payer: Medicare Other | Admitting: Physical Therapy

## 2016-11-25 LAB — CUP PACEART REMOTE DEVICE CHECK
Date Time Interrogation Session: 20181108193514
Implantable Pulse Generator Implant Date: 20181009

## 2016-11-26 ENCOUNTER — Other Ambulatory Visit: Payer: Self-pay | Admitting: *Deleted

## 2016-11-26 DIAGNOSIS — I639 Cerebral infarction, unspecified: Secondary | ICD-10-CM

## 2016-11-26 MED ORDER — ATORVASTATIN CALCIUM 40 MG PO TABS: 40.0000 mg | ORAL_TABLET | Freq: Every day | ORAL | 1 refills | Status: DC

## 2016-11-30 ENCOUNTER — Ambulatory Visit: Payer: Medicare Other | Admitting: Physical Therapy

## 2016-11-30 ENCOUNTER — Ambulatory Visit: Payer: Medicare Other | Admitting: Occupational Therapy

## 2016-11-30 ENCOUNTER — Encounter: Payer: Self-pay | Admitting: Occupational Therapy

## 2016-11-30 ENCOUNTER — Encounter: Payer: Self-pay | Admitting: Physical Therapy

## 2016-11-30 DIAGNOSIS — R2689 Other abnormalities of gait and mobility: Secondary | ICD-10-CM

## 2016-11-30 DIAGNOSIS — I69318 Other symptoms and signs involving cognitive functions following cerebral infarction: Secondary | ICD-10-CM

## 2016-11-30 DIAGNOSIS — R2681 Unsteadiness on feet: Secondary | ICD-10-CM | POA: Diagnosis not present

## 2016-11-30 DIAGNOSIS — M6281 Muscle weakness (generalized): Secondary | ICD-10-CM | POA: Diagnosis not present

## 2016-11-30 DIAGNOSIS — I69354 Hemiplegia and hemiparesis following cerebral infarction affecting left non-dominant side: Secondary | ICD-10-CM | POA: Diagnosis not present

## 2016-11-30 DIAGNOSIS — R270 Ataxia, unspecified: Secondary | ICD-10-CM

## 2016-11-30 DIAGNOSIS — R278 Other lack of coordination: Secondary | ICD-10-CM | POA: Diagnosis not present

## 2016-11-30 DIAGNOSIS — R41842 Visuospatial deficit: Secondary | ICD-10-CM

## 2016-11-30 NOTE — Therapy (Signed)
[facility] [address] [place], [place], [address] Phone: [phone]   Fax:  [phone]  Occupational Therapy Evaluation  Patient Details  Name: Sarah [name] MRN: [id number] Date of Birth: 07/29/1945 Referring Provider: Dr. Letta Pate   Encounter Date: 11/30/2016  OT End of Session - 11/30/16 0913    Visit Number  1    Number of Visits  16    Date for OT Re-Evaluation  01/25/17    Authorization Type  [place] will need PN and G code every 10th visit    Authorization Time Period  60 days    Authorization - Visit Number  1    Authorization - Number of Visits  10    OT Start Time  0802    OT Stop Time  0845    OT Time Calculation (min)  43 min    Activity Tolerance  Patient tolerated treatment well       Past Medical History:  Diagnosis Date  . Boils   . Glaucoma   . Heart murmur   . HTN (hypertension)   . Hx of adenomatous colonic polyps   . Hyperlipidemia   . Osteoporosis   . Pneumonia   . Type II or unspecified type diabetes mellitus without mention of complication, uncontrolled     Past Surgical History:  Procedure Laterality Date  . ABDOMINAL HYSTERECTOMY    . CARPAL TUNNEL RELEASE     right  . COLONOSCOPY  12-10-10   per Dr. Deatra Ina, clear, repeat in 7 yrs   . IRRIGATION AND DEBRIDEMENT PERIRECTAL ABSCESS N/A 09/26/2015   Performed by Kinsinger, Arta Bruce, MD at [facility]  . KNEE ARTHROSCOPY     right  . LOOP RECORDER INSERTION N/A 10/19/2016   Performed by Thompson Grayer, MD at [facility]  . OPEN REDUCTION INTERNAL FIXATION (ORIF) ANKLE FRACTURE Right 03/24/2012   Performed by Newt Minion, MD at [facility]  . POLYPECTOMY    . TONSILLECTOMY    . TRANSESOPHAGEAL ECHOCARDIOGRAM (TEE) N/A 10/18/2016   Performed by Fay Records, MD at [facility] ENDOSCOPY    There were no vitals filed for this visit.  Subjective Assessment - 11/30/16 0803    Currently in Pain?  Yes    Pain Score  3      Pain Location  Head    Pain Orientation  Right    Pain Descriptors / Indicators  Dull    Pain Type  Acute pain    Pain Onset  More than a month ago    Pain Frequency  Intermittent    Aggravating Factors   when I first wake up    Pain Relieving Factors  Just going through my day it goes away        [facility] OT Assessment - 11/30/16 0001      Assessment   Diagnosis  R MCA CVA    Referring Provider  Dr. Letta Pate    Onset Date  10/15/16    Prior Therapy  Inpt rehab, pt now seeing PT in outpatient.        Precautions   Precautions  Fall      Restrictions   Weight Bearing Restrictions  No      Balance Screen   Has the patient fallen in the past 6 months  No Pt working with PT now at home      [place] expects to be discharged to:  Private residence    Living Arrangements  Parent    Available Help at Discharge  Available 24 hours/day    Bathroom Shower/Tub  Technical sales engineer, 2 grab bars,       Prior Function   Level of Independence  Independent    Vocation  Other (comment) taking care of mother meds, cooking    Vocation Requirements  stopped working to care for her mom, wants to regain typing to work from home    Leisure  cook, reading      ADL   Eating/Feeding  Modified independent    Grooming  Independent    Programme researcher, broadcasting/film/video    Lower Body Bathing  Independent    Upper Body Dressing  Independent pt has not tried buttons and wears sports bra    Lower Body Dressing  Modified independent    Toilet Transfer  Modified independent    Toileting - Clothing Manipulation  Modified independent    [place] Transfer  Modified independent      IADL   Shopping  Needs to be accompanied on any shopping trip    [place] light daily tasks such as dishwashing, bed making    Meal Prep  Able to complete simple cold  meal and snack prep    Community Mobility  Relies on family or friends for transportation    Medication Management  Is responsible for taking medication in correct dosages at correct time    Psychiatrist financial matters independently (budgets, writes checks, pays rent, bills goes to bank), collects and keeps track of income      Mobility   Mobility Status  Needs assist    Mobility Status Comments  Pt states some one is usually with her when outside or in community but she feels safe ambulating in a store with her walker and without others near by.       Written Expression   Dominant Hand  Right      Vision - History   Baseline Vision  Wears glasses only for reading and driving at night    Additional Comments  Pt states "sometimes I see double"      Vision Assessment   Eye Alignment  Impaired (comment) mild    Ocular Range of Motion  Within Functional Limits    Tracking/Visual Pursuits  Able to track stimulus in all quads without difficulty    Saccades  Within functional limits    Visual Fields  No apparent deficits    Diplopia Assessment  Other (comment) pt reports occassional diplopia unable to elicit today    Comment  Pt with some decreased control of R eye for pursuits with fast movements and pt reports feeling "off balance with fast tracking movements.  Pt also reports feelings of disequilibrium with head turns (standing and walking).  Pt also states feelings intensive in busy environment (ie store)      Activity Tolerance   Activity Tolerance  Tolerates 30 min activity with muliple rests      Cognition   Overall Cognitive Status  Impaired/Different from baseline    Attention  Alternating;Divided    Memory  --    Problem Solving  Impaired      Sensation   Light Touch  Appears Intact    Hot/Cold  Appears Intact  Proprioception  Appears Intact      Coordination   Gross Motor Movements are Fluid and Coordinated  No LUE slowed and mildly incoordinated     Fine Motor Movements are Fluid and Coordinated  No    Finger Nose Finger Test  moderately impaired    9 Hole Peg Test  Right;Left    Right 9 Hole Peg Test  19.74    Left 9 Hole Peg Test  1.04.47    Box and Blocks  30      Praxis   Praxis  -- to be further assessed via functional activity.       Tone   Assessment Location  Left Upper Extremity      ROM / Strength   AROM / PROM / Strength  AROM;Strength      AROM   Overall AROM   Within functional limits for tasks performed    Overall AROM Comments  Pt now able to complete composite flexion in L hand however weak see below for grip strength.        Strength   Overall Strength  Deficits    Overall Strength Comments  LUE strength WFL's except for L grip strength and pinch strength. Pt also with slowed incoordinated movements in L hand.       Hand Function   Right Hand Gross Grasp  Functional    Right Hand Grip (lbs)  30    Left Hand Gross Grasp  Impaired    Left Hand Grip (lbs)  10      LUE Tone   LUE Tone  Within Functional Limits                        OT Short Term Goals - 11/30/16 0856      OT SHORT TERM GOAL #1   Title  Pt will be mod I with HEP for coordination and grip/pinch strength LUE - 12/28/2016    Status  New      OT SHORT TERM GOAL #2   Title  Pt will demonstrate improved coordination in LUE as evidenced by decreasing time on 9 hole peg test by at least 20 seconds to assist with functional tasks.     Status  New      OT SHORT TERM GOAL #3   Title  Pt will be min a for hot meal prep at ambulatory level and demonstrate good safety awareness    Status  New      OT SHORT TERM GOAL #4   Title  Pt will demonstrate improved grip strength LUE by at least 5 pounds to assist with opening jars (baseline = 10 pounds)      OT SHORT TERM GOAL #5   Title  Pt will demonstrate improved coordination as evidenced by decreasing time on 9 hole peg test by at leats 20 seconds to assist with fine motor tasks  (baseline = 1.04.47)    Status  New        OT Long Term Goals - 11/30/16 0900      OT LONG TERM GOAL #1   Title  Pt will be mod I with upgraded HEP - 01/25/2017    Status  New      OT LONG TERM GOAL #2   Title  Pt will demonstrate improved grip strength by at least 8 pounds in LUE to assist with home mgmt tasks (baseline = 10 pounds)    Status  New  OT LONG TERM GOAL #3   Title  Pt will demonstrate improved coordination as evidenced by decreasing time on 9 hole peg test by at least 25 seconds to reduce effort for buttoning, tying    Status  New      OT LONG TERM GOAL #4   Title  Pt will demonstrate ability to type simple email with extra time.     Status  New      OT LONG TERM GOAL #5   Title  Pt will be mod I with hot meal prep.      OT LONG TERM GOAL #6   Title  Pt will be mod I with home mgmt tasks such as cleaning, laundry    Status  New      OT LONG TERM GOAL #7   Title  Pt will verbalize understanding of return to driving recommendations.     Status  New      OT LONG TERM GOAL #8   Title  Pt will demonstrate improved LUE functional use as evidenced by improving score on Box and Blocks by a least 4 blocks to assist with IADL's (baseline = 30)    Status  New            Plan - 11/30/16 0906    Clinical Impression Statement  Pt is a 71 year old female s/p R MCA CVA on 10/15/2016.  Pt was discharged home on 10/28/2016 after inpt rehab stay. Pt presents today with the following deficits that impact her independence:  L non dominant hemiplegia, decreased strength, decreased coordination, ataxia, impaired higher level cognitive deficits, decreased activity tolerance, decreased balance, impaired visual vestibular integration.  Pt was very tearful during evalution today stating " this is the first Thanksgiving I can't cook for."  Support provided and at end of session discussed what pt would be able to do.  Pt verbalized understanding and smiling by end of session.      Occupational Profile and client history currently impacting functional performance  HTN, diabetes, HLD, COPD, PN, + tobacco use    Occupational performance deficits (Please refer to evaluation for details):  ADL's;IADL's;Rest and Sleep;Work;Leisure;Social Participation    Rehab Potential  Good    OT Frequency  2x / week    OT Duration  8 weeks    OT Treatment/Interventions  Self-care/ADL training;Aquatic Therapy;Electrical Stimulation;Moist Heat;Ultrasound;Therapeutic exercise;Neuromuscular education;DME and/or AE instruction;Manual Therapy;Therapist, nutritional;Therapeutic activities;Cognitive remediation/compensation;Visual/perceptual remediation/compensation;Patient/family education;Balance training    Plan  initiate HEP for grip/pinch and coordination, further assess visual vestibular integration, address balance, and functional mobility, address simple meal prep, incorporate cognition    Clinical Decision Making  Multiple treatment options, significant modification of task necessary    Consulted and Agree with Plan of Care  Patient       Patient will benefit from skilled therapeutic intervention in order to improve the following deficits and impairments:  Abnormal gait, Decreased activity tolerance, Decreased balance, Decreased cognition, Decreased mobility, Decreased knowledge of use of DME, Decreased coordination, Decreased strength, Impaired UE functional use, Impaired vision/preception  Visit Diagnosis: Hemiplegia and hemiparesis following cerebral infarction affecting left non-dominant side ([place]) - Plan: Ot plan of care cert/re-cert  Ataxia - Plan: Ot plan of care cert/re-cert  Other lack of coordination - Plan: Ot plan of care cert/re-cert  Unsteadiness on feet - Plan: Ot plan of care cert/re-cert  Visuospatial deficit - Plan: Ot plan of care cert/re-cert  Other symptoms and signs involving cognitive functions following cerebral infarction -  Plan: Ot plan of care  cert/re-cert  G-Codes - 44/81/85 0915    Functional Assessment Tool Used (Outpatient only)  9 hole peg, dynamometer, Box and blocks, skilled clinical observation    Functional Limitation  Carrying, moving and handling objects    Carrying, Moving and Handling Objects Current Status (U3149)  At least 60 percent but less than 80 percent impaired, limited or restricted    Carrying, Moving and Handling Objects Goal Status (F0263)  At least 40 percent but less than 60 percent impaired, limited or restricted       Problem List Patient Active Problem List   Diagnosis Date Noted  . Hemiparesis affecting left side as late effect of cerebrovascular accident (CVA) ([place]) 11/11/2016  . Gait disturbance, post-stroke 11/11/2016  . Dysarthria as late effect of stroke 11/11/2016  . Carotid artery stenosis 11/02/2016  . Right middle cerebral artery stroke ([place]) 10/19/2016  . Left arm weakness   . Diastolic dysfunction   . Hypertension with heart disease   . Tobacco abuse   . Acute blood loss anemia   . Cerebral infarction due to embolism of right middle cerebral artery ([place])   . Stroke (cerebrum) ([place]) 10/15/2016  . Perirectal abscess 09/26/2015  . Vitamin D deficiency 07/23/2015  . Osteopenia 02/16/2013  . Ankle fracture 01/19/2013  . Anemia 09/27/2011  . Uncontrolled type 2 diabetes mellitus with insulin therapy ([place]) 09/24/2011  . Hyperlipidemia 09/24/2011  . Hypertension 09/24/2011    Quay Burow, OTR/L 11/30/2016, 9:20 AM  [facility] [address] [place], [place], [address] Phone: [phone]   Fax:  [phone]  Name: Sarah [name] MRN: [id number] Date of Birth: 05-05-1945

## 2016-11-30 NOTE — Patient Instructions (Signed)
   When walking, think about making your heel hit the ground first with each step. And work on increasing your speed.    Toe Raise (Sitting)    Sit with feet slightly in front of your knees. Raise toes, keeping heels on floor. Repeat __100__ times per set. ( or do during each commercial break). Do __1__ sets per session. Do __3__ sessions per day.  * to increase difficulty, bring heels back towards you.  http://orth.exer.us/47   Copyright  VHI. All rights reserved.

## 2016-11-30 NOTE — Therapy (Signed)
[facility] [address] [place], [place], [address] Phone: [phone]   Fax:  [phone]  Physical Therapy Treatment  Patient Details  Name: Sarah [name] MRN: [id number] Date of Birth: March 25, 1945 Referring Provider: Alysia Penna MD   Encounter Date: 11/30/2016  PT End of Session - 11/30/16 0929    Visit Number  4    Number of Visits  17    Date for PT Re-Evaluation  01/07/17    Authorization Type  [employer] Medicare    Authorization Time Period  11/08/16  to  01/07/17    PT Start Time  0847    PT Stop Time  0926    PT Time Calculation (min)  39 min    Equipment Utilized During Treatment  Gait belt    Activity Tolerance  Patient tolerated treatment well    Behavior During Therapy  [facility] for tasks assessed/performed       Past Medical History:  Diagnosis Date  . Boils   . Glaucoma   . Heart murmur   . HTN (hypertension)   . Hx of adenomatous colonic polyps   . Hyperlipidemia   . Osteoporosis   . Pneumonia   . Type II or unspecified type diabetes mellitus without mention of complication, uncontrolled     Past Surgical History:  Procedure Laterality Date  . ABDOMINAL HYSTERECTOMY    . CARPAL TUNNEL RELEASE     right  . COLONOSCOPY  12-10-10   per Dr. Deatra Ina, clear, repeat in 7 yrs   . IRRIGATION AND DEBRIDEMENT PERIRECTAL ABSCESS N/A 09/26/2015   Performed by Kinsinger, Arta Bruce, MD at [facility]  . KNEE ARTHROSCOPY     right  . LOOP RECORDER INSERTION N/A 10/19/2016   Performed by Thompson Grayer, MD at [facility]  . OPEN REDUCTION INTERNAL FIXATION (ORIF) ANKLE FRACTURE Right 03/24/2012   Performed by Newt Minion, MD at [facility]  . POLYPECTOMY    . TONSILLECTOMY    . TRANSESOPHAGEAL ECHOCARDIOGRAM (TEE) N/A 10/18/2016   Performed by Fay Records, MD at [facility] ENDOSCOPY    There were no vitals filed for this visit.  Subjective Assessment - 11/30/16 0847    Subjective  Reports feels anxious  in busy environments (grocery store, department stores). At home has had to catch herself a couple of times. Uses rollator first thing in the morning and as the day goes on she does not need to use it (always uses it if she goes out).     Patient is accompained by:  Family member    Pertinent History  HTN, HLD, DM, neurop, tobacco, COPD, OP (lives w/ mom; sister came to stay)    Limitations  Standing;Walking    How long can you stand comfortably?  15 min    Patient Stated Goals  to improve the use of her left hand and walk without a walker    Currently in Pain?  Yes    Pain Score  2     Pain Orientation  Right    Pain Descriptors / Indicators  Dull    Pain Type  Acute pain    Pain Onset  1 to 4 weeks ago    Pain Frequency  Intermittent                      [facility] Adult PT Treatment/Exercise - 11/30/16 0001      Transfers   Transfers  Sit  to Stand;Stand to Sit    Sit to Stand  6: Modified independent (Device/Increase time)    Stand to Sit  5: Supervision    Stand to Sit Details  as turning with threshold to move rollator across and slightly unsteady; recovered without assist      Ambulation/Gait   Ambulation/Gait  Yes    Ambulation/Gait Assistance  4: Min guard    Ambulation Distance (Feet)  480 Feet 240    Assistive device  None;Rollator    Gait Pattern  Step-through pattern;Decreased step length - right;Decreased step length - left;Decreased dorsiflexion - left;Decreased weight shift to left;Left foot flat;Decreased stance time - left;Poor foot clearance - left    Ambulation Surface  Level    Pre-Gait Activities  anterior-posterior wt shifts with LLE in front with heelstrike through foot flat x 20    Gait Comments  Patient's imbalance more evident when walking her self-selected speed (very slow). Reports she's always been a fast walker and slows down to be more safe. Frequent cues to incr speed with improved balance and gait dynamics. Maintains up to 80 ft and then  begins to slow again (especially without rollator). Educated on Lt heelstrike after pt caught Lt toe/foot twice with min assist to regain balance. After educated in heelstrike and incr velocity, did not catch her foot again.       Knee/Hip Exercises: Standing   Hip Abduction  Stengthening;Right;Left;1 set;10 reps;Knee straight    Abduction Limitations  vc for shoulders upright and leg in neutral rotation (she tends to externally rotate)    SLS  at // bars; light bil UE support x 10 seconds with lightening until no support (max 2 seconds without)      Ankle Exercises: Seated   Toe Raise  -- 100 reps; heels in front of knees to incr ROM             PT Education - 11/30/16 0956    Education provided  Yes    Education Details  added to HEP    Person(s) Educated  Patient    Methods  Explanation;Demonstration;Verbal cues;Handout    Comprehension  Verbalized understanding;Returned demonstration;Verbal cues required;Need further instruction       PT Short Term Goals - 11/16/16 1107      PT SHORT TERM GOAL #1   Title  Assess gait velocity and set STG and LTG as appropriate. (Target all STGs 12/08/16)    Baseline  11/16/16: met today with PT to set goals    Time  --    Period  --    Status  Achieved      PT SHORT TERM GOAL #2   Title  Patient will be independent with HEP for balance, strength, and gait training.     Time  4    Period  Weeks    Status  On-going      PT SHORT TERM GOAL #3   Title  Patient will ambulate 250 ft on level/unlevel exterior surfaces with LRAD modified independent.     Time  4    Period  Weeks    Status  On-going      PT SHORT TERM GOAL #4   Title  Patient will improve TUG to <15 seconds with LRAD.     Time  8    Period  Weeks    Status  On-going        PT Long Term Goals - 11/16/16 1107      PT  LONG TERM GOAL #1   Title  Patient will be independent with updated HEP for balance, strength and ambulation.    Time  8    Period  Weeks    Status   New      PT LONG TERM GOAL #2   Title  Patient will verbalize her plan for continued community-based activity plan to assist with reducing stroke risk factors.    Time  8    Period  Weeks    Status  New      PT LONG TERM GOAL #3   Title  Patient will ambulate 1000 ft on outside surfaces with LRAD at modified independent level    Time  8    Period  Weeks    Status  New      PT LONG TERM GOAL #4   Title  Patient will improve TUG to <13.5 seconds with LRAD.     Time  8    Period  Weeks    Status  New            Plan - 11/30/16 0957    Clinical Impression Statement  session focused on balance, gait, and strength training. In addition, pt had reported incr imbalance with quick head turns when working with OT and performed quick turns left and right with no sense of "feeling off" noted. (In further discussion, OT noticed Rt eye "hops" with fast smooth pursuits and pt became symptomatic. OT plans to give pt eye/vestibular exercises to address). Patient is progressing with balance and gait and will continue to benefit from PT.    Rehab Potential  Good    PT Frequency  2x / week    PT Duration  8 weeks    PT Treatment/Interventions  ADLs/Self Care Home Management;DME Instruction;Gait training;Stair training;Functional mobility training;Orthotic Fit/Training;Patient/family education;Neuromuscular re-education;Balance training;Therapeutic exercise;Therapeutic activities;Passive range of motion    PT Next Visit Plan  check STGs; Balance, LE strengthening, stair training with L or R handrail, work on gait velocity and heelstrike--check with pt if she has counted how many times/day she is catching Lt foot while walking; ?need to consider foot up brace??; progress to gait with cane.    Consulted and Agree with Plan of Care  Patient    Family Member Consulted  --       Patient will benefit from skilled therapeutic intervention in order to improve the following deficits and impairments:   Decreased balance, Decreased knowledge of use of DME, Decreased mobility, Decreased strength, Difficulty walking, Impaired UE functional use, Impaired vision/preception, Obesity  Visit Diagnosis: Muscle weakness (generalized)  Other abnormalities of gait and mobility  Unsteadiness on feet     Problem List Patient Active Problem List   Diagnosis Date Noted  . Hemiparesis affecting left side as late effect of cerebrovascular accident (CVA) ([place]) 11/11/2016  . Gait disturbance, post-stroke 11/11/2016  . Dysarthria as late effect of stroke 11/11/2016  . Carotid artery stenosis 11/02/2016  . Right middle cerebral artery stroke ([place]) 10/19/2016  . Left arm weakness   . Diastolic dysfunction   . Hypertension with heart disease   . Tobacco abuse   . Acute blood loss anemia   . Cerebral infarction due to embolism of right middle cerebral artery ([place])   . Stroke (cerebrum) ([place]) 10/15/2016  . Perirectal abscess 09/26/2015  . Vitamin D deficiency 07/23/2015  . Osteopenia 02/16/2013  . Ankle fracture 01/19/2013  . Anemia 09/27/2011  . Uncontrolled type 2 diabetes mellitus with insulin  therapy ([place]) 09/24/2011  . Hyperlipidemia 09/24/2011  . Hypertension 09/24/2011    Rexanne Mano, PT 11/30/2016, 10:04 AM  [facility] [address] [place], [place], [address] Phone: [phone]   Fax:  [phone]  Name: Sarah [name] MRN: [id number] Date of Birth: [birth year]

## 2016-12-04 ENCOUNTER — Other Ambulatory Visit: Payer: Self-pay | Admitting: Family Medicine

## 2016-12-04 DIAGNOSIS — I10 Essential (primary) hypertension: Secondary | ICD-10-CM

## 2016-12-06 ENCOUNTER — Ambulatory Visit: Payer: Medicare Other | Admitting: Family Medicine

## 2016-12-06 ENCOUNTER — Ambulatory Visit: Payer: Medicare Other | Admitting: Occupational Therapy

## 2016-12-07 ENCOUNTER — Encounter: Payer: Self-pay | Admitting: Neurology

## 2016-12-07 ENCOUNTER — Encounter: Payer: Self-pay | Admitting: Occupational Therapy

## 2016-12-07 ENCOUNTER — Ambulatory Visit: Payer: Medicare Other | Admitting: Neurology

## 2016-12-07 ENCOUNTER — Encounter: Payer: Self-pay | Admitting: Physical Therapy

## 2016-12-07 ENCOUNTER — Ambulatory Visit: Payer: Medicare Other | Admitting: Physical Therapy

## 2016-12-07 ENCOUNTER — Ambulatory Visit: Payer: Medicare Other | Admitting: Occupational Therapy

## 2016-12-07 VITALS — BP 157/78 | HR 75 | Ht 60.0 in | Wt 147.7 lb

## 2016-12-07 DIAGNOSIS — R2681 Unsteadiness on feet: Secondary | ICD-10-CM

## 2016-12-07 DIAGNOSIS — I69318 Other symptoms and signs involving cognitive functions following cerebral infarction: Secondary | ICD-10-CM | POA: Diagnosis not present

## 2016-12-07 DIAGNOSIS — IMO0002 Reserved for concepts with insufficient information to code with codable children: Secondary | ICD-10-CM

## 2016-12-07 DIAGNOSIS — I69354 Hemiplegia and hemiparesis following cerebral infarction affecting left non-dominant side: Secondary | ICD-10-CM | POA: Diagnosis not present

## 2016-12-07 DIAGNOSIS — E1165 Type 2 diabetes mellitus with hyperglycemia: Secondary | ICD-10-CM

## 2016-12-07 DIAGNOSIS — R2689 Other abnormalities of gait and mobility: Secondary | ICD-10-CM

## 2016-12-07 DIAGNOSIS — Z720 Tobacco use: Secondary | ICD-10-CM

## 2016-12-07 DIAGNOSIS — I63411 Cerebral infarction due to embolism of right middle cerebral artery: Secondary | ICD-10-CM

## 2016-12-07 DIAGNOSIS — R278 Other lack of coordination: Secondary | ICD-10-CM

## 2016-12-07 DIAGNOSIS — I119 Hypertensive heart disease without heart failure: Secondary | ICD-10-CM

## 2016-12-07 DIAGNOSIS — M6281 Muscle weakness (generalized): Secondary | ICD-10-CM | POA: Diagnosis not present

## 2016-12-07 DIAGNOSIS — R41842 Visuospatial deficit: Secondary | ICD-10-CM

## 2016-12-07 DIAGNOSIS — R270 Ataxia, unspecified: Secondary | ICD-10-CM | POA: Diagnosis not present

## 2016-12-07 DIAGNOSIS — Z794 Long term (current) use of insulin: Secondary | ICD-10-CM | POA: Diagnosis not present

## 2016-12-07 MED ORDER — AMLODIPINE BESYLATE 5 MG PO TABS: 5.0000 mg | ORAL_TABLET | Freq: Every day | ORAL | 1 refills | Status: DC

## 2016-12-07 NOTE — Therapy (Signed)
[facility] [address] [place], [place], [address] Phone: [phone]   Fax:  [phone]  Occupational Therapy Treatment  Patient Details  Name: Sarah [name] MRN: [id number] Date of Birth: 1945/06/15 Referring Provider: Dr. Letta Pate   Encounter Date: 12/07/2016  OT End of Session - 12/07/16 1342    Visit Number  2    Number of Visits  16    Date for OT Re-Evaluation  01/25/17    Authorization Type  [facility] will need PN and G code every 10th visit    Authorization Time Period  60 days    Authorization - Visit Number  2    Authorization - Number of Visits  10    OT Start Time  4401    OT Stop Time  1230    OT Time Calculation (min)  42 min    Activity Tolerance  Patient tolerated treatment well    Behavior During Therapy  [facility] for tasks assessed/performed       Past Medical History:  Diagnosis Date  . Boils   . Glaucoma   . Heart murmur   . HTN (hypertension)   . Hx of adenomatous colonic polyps   . Hyperlipidemia   . Osteoporosis   . Pneumonia   . Stroke ([place])   . Type II or unspecified type diabetes mellitus without mention of complication, uncontrolled     Past Surgical History:  Procedure Laterality Date  . ABDOMINAL HYSTERECTOMY    . CARPAL TUNNEL RELEASE     right  . COLONOSCOPY  12-10-10   per Dr. Deatra Ina, clear, repeat in 7 yrs   . INCISION AND DRAINAGE PERIRECTAL ABSCESS N/A 09/26/2015   Procedure: IRRIGATION AND DEBRIDEMENT PERIRECTAL ABSCESS;  Surgeon: Mickeal Skinner, MD;  Location: [facility];  Service: General;  Laterality: N/A;  . KNEE ARTHROSCOPY     right  . LOOP RECORDER INSERTION N/A 10/19/2016   Procedure: LOOP RECORDER INSERTION;  Surgeon: Thompson Grayer, MD;  Location: [facility];  Service: Cardiovascular;  Laterality: N/A;  . ORIF ANKLE FRACTURE Right 03/24/2012   Procedure: OPEN REDUCTION INTERNAL FIXATION (ORIF) ANKLE FRACTURE;  Surgeon: Newt Minion, MD;   Location: [facility];  Service: Orthopedics;  Laterality: Right;  Open Reduction Internal Fixation Right Bimalleolar ankle fracture  . POLYPECTOMY    . TEE WITHOUT CARDIOVERSION N/A 10/18/2016   Procedure: TRANSESOPHAGEAL ECHOCARDIOGRAM (TEE);  Surgeon: Fay Records, MD;  Location: [facility];  Service: Cardiovascular;  Laterality: N/A;  . TONSILLECTOMY      There were no vitals filed for this visit.  Subjective Assessment - 12/07/16 1201    Subjective   He slightly increased my amlodipine    Currently in Pain?  No/denies    Pain Score  0-No pain                   OT Treatments/Exercises (OP) - 12/07/16 0001      ADLs   Driving  Patient's Neurologist approved her to begin a graduated driving program.  Discussed specifics of program.      ADL Comments  Reviewed short and long term goals with patient.  She reports that she has started simple cooking tasks at home.  She is very excited to see a long term typing goal.        Fine Motor Coordination   Other Fine Motor Exercises  Educated patient on putty exercises (yellow) to increase grip and pinch strength  in left hand.  Designed coordiantion exercise program to help aptient improve fine motor and in hand manipulation skills with left hand.               OT Education - 12/07/16 1341    Education provided  Yes    Education Details  HEP for hand strengthening and fine motor coordination left    Person(s) Educated  Patient    Methods  Explanation;Demonstration;Handout;Verbal cues    Comprehension  Verbalized understanding;Returned demonstration       OT Short Term Goals - 12/07/16 1348      OT SHORT TERM GOAL #1   Title  Pt will be mod I with HEP for coordination and grip/pinch strength LUE - 12/28/2016    Status  On-going      OT SHORT TERM GOAL #2   Title  Pt will demonstrate improved coordination in LUE as evidenced by decreasing time on 9 hole peg test by at least 20 seconds to assist with functional tasks.      Status  On-going      OT SHORT TERM GOAL #3   Title  Pt will be min a for hot meal prep at ambulatory level and demonstrate good safety awareness    Status  On-going      OT SHORT TERM GOAL #4   Title  Pt will demonstrate improved grip strength LUE by at least 5 pounds to assist with opening jars (baseline = 10 pounds)    Status  On-going      OT SHORT TERM GOAL #5   Title  Pt will demonstrate improved coordination as evidenced by decreasing time on 9 hole peg test by at leats 20 seconds to assist with fine motor tasks (baseline = 1.04.47)    Status  On-going        OT Long Term Goals - 12/07/16 1348      OT LONG TERM GOAL #1   Title  Pt will be mod I with upgraded HEP - 01/25/2017    Status  On-going      OT LONG TERM GOAL #2   Title  Pt will demonstrate improved grip strength by at least 8 pounds in LUE to assist with home mgmt tasks (baseline = 10 pounds)    Status  On-going      OT LONG TERM GOAL #3   Title  Pt will demonstrate improved coordination as evidenced by decreasing time on 9 hole peg test by at least 25 seconds to reduce effort for buttoning, tying    Status  On-going      OT LONG TERM GOAL #4   Title  Pt will demonstrate ability to type simple email with extra time.     Status  On-going      OT LONG TERM GOAL #5   Title  Pt will be mod I with hot meal prep.    Status  On-going      OT LONG TERM GOAL #6   Title  Pt will be mod I with home mgmt tasks such as cleaning, laundry    Status  On-going      OT LONG TERM GOAL #7   Title  Pt will verbalize understanding of return to driving recommendations.     Status  On-going            Plan - 12/07/16 1342    Clinical Impression Statement  Patient is very eager to work toward OT goals.  She is  motivated for greater independence with ADL/IADL    Rehab Potential  Good    OT Frequency  2x / week    OT Duration  8 weeks    OT Treatment/Interventions  Self-care/ADL training;Aquatic Therapy;Electrical  Stimulation;Moist Heat;Ultrasound;Therapeutic exercise;Neuromuscular education;DME and/or AE instruction;Manual Therapy;Therapist, nutritional;Therapeutic activities;Cognitive remediation/compensation;Visual/perceptual remediation/compensation;Patient/family education;Balance training    Plan  Simple hot meal prep, functional mobility, in hnad manipulation    OT Home Exercise Plan  initiated putty (yellow) and FM coord    Consulted and Agree with Plan of Care  Patient       Patient will benefit from skilled therapeutic intervention in order to improve the following deficits and impairments:  Abnormal gait, Decreased activity tolerance, Decreased balance, Decreased cognition, Decreased mobility, Decreased knowledge of use of DME, Decreased coordination, Decreased strength, Impaired UE functional use, Impaired vision/preception  Visit Diagnosis: Hemiplegia and hemiparesis following cerebral infarction affecting left non-dominant side (HCC)  Ataxia  Other lack of coordination  Unsteadiness on feet  Visuospatial deficit  Other symptoms and signs involving cognitive functions following cerebral infarction  Muscle weakness (generalized)    Problem List Patient Active Problem List   Diagnosis Date Noted  . Hemiparesis affecting left side as late effect of cerebrovascular accident (CVA) ([place]) 11/11/2016  . Gait disturbance, post-stroke 11/11/2016  . Dysarthria as late effect of stroke 11/11/2016  . Carotid artery stenosis 11/02/2016  . Right middle cerebral artery stroke ([place]) 10/19/2016  . Left arm weakness   . Diastolic dysfunction   . Hypertension with heart disease   . Tobacco abuse   . Acute blood loss anemia   . Cerebral infarction due to embolism of right middle cerebral artery ([place])   . Stroke (cerebrum) ([place]) 10/15/2016  . Perirectal abscess 09/26/2015  . Vitamin D deficiency 07/23/2015  . Osteopenia 02/16/2013  . Ankle fracture 01/19/2013  . Anemia 09/27/2011  .  Uncontrolled type 2 diabetes mellitus with insulin therapy ([place]) 09/24/2011  . Hyperlipidemia 09/24/2011  . Hypertension 09/24/2011    [name], OTR/L 12/07/2016, 1:50 PM  [facility] [address] [place], [place], [address] Phone: [phone]   Fax:  [phone]  Name: LISSY DEUSER MRN: [id number] Date of Birth: 1945/12/29

## 2016-12-07 NOTE — Progress Notes (Signed)
HPI:   Sarah [name] is a 71 y.o. female, who is here today to follow on recent OV.   She was seen on 11/02/16 for hospital F/U aster CVA.  Since her last visit she has followed with neurologist, Dr.Xu.  She has also follow with cardiologist.  She is having OT and outpt PT 2 times per week. Planning on starting speech therapy. Residual slurred speech and left-sided weakness after CVA.  She is on Plavix 75 mg daily and Aspirin 325 mg daily.  She is now using a cane.She still has a walker at home. She is not using cane or walker at home, she moves around the house holding holds and furniture, walks up and down stairs. Denies falls.  She also had a heart monitor placement.  Anemia:  Lab Results  Component Value Date   WBC 7.5 10/20/2016   HGB 11.5 (L) 10/20/2016   HCT 35.0 (L) 10/20/2016   MCV 92.6 10/20/2016   PLT 325 10/20/2016  She is not on iron supplementation.  HTN:  BP's  140's/80. Currently she is on Amlodipine 5 mg daily, increased from 2.5 mg yesterday. Denies visual changes, chest pain, dyspnea, palpitation, claudication, focal weakness, or edema.  Sometimes morning temporoparietal headache, resolves a few minutes after getting up.  Lab Results  Component Value Date   CREATININE 0.56 10/19/2016   BUN 16 10/19/2016   NA 137 10/19/2016   K 3.5 10/19/2016   CL 104 10/19/2016   CO2 25 10/19/2016   DM II:  FG still in the mid 200's.  Lab Results  Component Value Date   HGBA1C 12.9 (H) 10/16/2016   Denies abdominal pain, nausea,vomiting, polydipsia,polyuria, or polyphagia.  She is on Lantus 15 U bid.   Review of Systems  Constitutional: Positive for fatigue. Negative for activity change, appetite change and fever.  HENT: Negative for mouth sores, nosebleeds and trouble swallowing.   Eyes: Negative for redness and visual disturbance.  Respiratory: Negative for cough, shortness of breath and wheezing.   Cardiovascular: Negative for  chest pain, palpitations and leg swelling.  Gastrointestinal: Negative for abdominal pain, nausea and vomiting.       Negative for changes in bowel habits.  Endocrine: Negative for polydipsia, polyphagia and polyuria.  Genitourinary: Negative for decreased urine volume, dysuria and hematuria.  Musculoskeletal: Positive for gait problem.  Skin: Negative for rash and wound.  Neurological: Positive for weakness (not new). Negative for seizures, syncope and headaches.      Current Outpatient Medications on File Prior to Visit  Medication Sig Dispense Refill  . amLODipine (NORVASC) 5 MG tablet Take 1 tablet (5 mg total) by mouth daily. 90 tablet 1  . ibuprofen (ADVIL,MOTRIN) 800 MG tablet TK 1 T PO Q 6 H PRN P  1  . TRAVATAN Z 0.004 % SOLN ophthalmic solution Place 1 drop into both eyes at bedtime.  11  . Vitamin D, Ergocalciferol, (DRISDOL) 50000 units CAPS capsule TAKE ONE CAPSULE BY MOUTH TWICE A WEEK ON MONDAY AND THURSDAY 8 capsule 2   No current facility-administered medications on file prior to visit.      Past Medical History:  Diagnosis Date  . Boils   . Glaucoma   . Heart murmur   . HTN (hypertension)   . Hx of adenomatous colonic polyps   . Hyperlipidemia   . Osteoporosis   . Pneumonia   . Stroke ([place])   . Type II or unspecified type diabetes mellitus without  mention of complication, uncontrolled    No Known Allergies  Social History   Socioeconomic History  . Marital status: Single    Spouse name: None  . Number of children: None  . Years of education: None  . Highest education level: None  Social Needs  . Financial resource strain: None  . Food insecurity - worry: None  . Food insecurity - inability: None  . Transportation needs - medical: None  . Transportation needs - non-medical: None  Occupational History  . None  Tobacco Use  . Smoking status: Former Smoker    Types: Cigarettes    Last attempt to quit: 10/15/2016    Years since quitting: 0.1  .  Smokeless tobacco: Never Used  . Tobacco comment: smokes occ.   Substance and Sexual Activity  . Alcohol use: Yes    Alcohol/week: 0.0 oz    Comment: occ  . Drug use: No  . Sexual activity: None  Other Topics Concern  . None  Social History Narrative  . None    Vitals:   12/08/16 1018 12/08/16 1126  BP: 133/76 (!) 155/80  Pulse: 73   Resp: 12   Temp: 98.8 F (37.1 C)   SpO2: 96%    Body mass index is 28.59 kg/m.      Physical Exam  Nursing note and vitals reviewed. Constitutional: She is oriented to person, place, and time. She appears well-developed. No distress.  HENT:  Head: Atraumatic.  Mouth/Throat: Oropharynx is clear and moist and mucous membranes are normal.  Eyes: Conjunctivae are normal. Pupils are equal, round, and reactive to light.  Neck: No JVD present.  Cardiovascular: Normal rate and regular rhythm.  No murmur heard. Pulses:      Dorsalis pedis pulses are 2+ on the right side, and 2+ on the left side.  Respiratory: Effort normal and breath sounds normal. No respiratory distress.  GI: Soft. She exhibits no mass. There is no hepatomegaly. There is no tenderness.  Musculoskeletal: She exhibits no edema or tenderness.  Lymphadenopathy:    She has no cervical adenopathy.  Neurological: She is alert and oriented to person, place, and time. Gait abnormal.  Mild slurred speech, minimal left facial weakness and LUE. Unstable agit assisted with a cane.  Skin: Skin is warm. No erythema.  Psychiatric: She has a normal mood and affect.  Fairly groomed, good eye contact.    ASSESSMENT AND PLAN:   Sarah [name] was seen today for follow-up.  Diagnoses and all orders for this visit:  Hypertension with heart disease  Not well controlled. Amlodipine was just adjusted, so no changes today. Possible complications of elevated BP discussed. Annual eye examination. BP check in 3 weeks and f/u in 3-4 months. Benazepril to be added if BP is not < 140/90.  -      Basic metabolic panel  Acute blood loss anemia  Further recommendations will be given according to lab results. Colonoscopy in 2012.  -     CBC  Cerebrovascular accident (CVA), unspecified mechanism ([place])  With residual left-sided hemiparesis and slurred speech. She is improving balance and strength. Continue PT,OT,and pending speech therapy. Plavix and Aspirin to continue, the latter one 60 more days then discontinue.  Fall prevention discussed.   -     Atorvastatin (LIPITOR) 40 MG tablet; Take 1 tablet (40 mg total) by mouth daily. -     Clopidogrel (PLAVIX) 75 MG tablet; Take 1 tablet (75 mg total) by mouth daily. -  Aspirin 325 MG EC tablet; Take 1 tablet (325 mg total) by mouth daily.  Uncontrolled type 2 diabetes mellitus with insulin therapy ([place])  Still elevated FG's. Lantus night dose increased from 15 U to 20 U. In 1-2 weeks if she is still having BS's in the 200's, she was instructed to increase Am Lantus dose to 20 U.  -     Insulin Glargine (LANTUS) 100 UNIT/ML Solostar Pen; 15 U in the morning and 20 U pm.    Keyetta Hollingworth G. [name], MD  [facility]. [place] office.

## 2016-12-07 NOTE — Patient Instructions (Addendum)
1. Grip Strengthening (Resistive Putty)   Squeeze putty using thumb and all fingers. Repeat 15 times. Do 1 sessions per day.   Extension (Assistive Putty)   Roll putty back and forth, being sure to use all fingertips. Repeat 3 times. Do 1 sessions per day.  Then pinch as below.   Palmar Pinch Strengthening (Resistive Putty)   Pinch putty between thumb and each fingertip in turn after rolling out     Coordination activities:   Ok to work with your elbow on the table.  Avoid hiking your shoulder.   Work for 5-10 minutes daily  Pick up pennies and place in coin bank.   Pick up pennies until you have 5 in your hand, and stack. Twirl a pen end over end in fingers - let your right hand teach your left hand. Turn a card over end over end.   Shuffle cards in both hands.

## 2016-12-07 NOTE — Therapy (Signed)
[facility] [address] [place], [place], [address] Phone: [phone]   Fax:  [phone]  Physical Therapy Treatment  Patient Details  Name: Sarah [name] MRN: [id number] Date of Birth: May 09, 1945 Referring Provider: Alysia Penna MD   Encounter Date: 12/07/2016  PT End of Session - 12/07/16 2020    Visit Number  5    Number of Visits  17    Date for PT Re-Evaluation  01/07/17    Authorization Type  [employer] Medicare    Authorization Time Period  11/08/16  to  01/07/17    PT Start Time  1103    PT Stop Time  1145    PT Time Calculation (min)  42 min    Activity Tolerance  Patient tolerated treatment well    Behavior During Therapy  [facility] for tasks assessed/performed       Past Medical History:  Diagnosis Date  . Boils   . Glaucoma   . Heart murmur   . HTN (hypertension)   . Hx of adenomatous colonic polyps   . Hyperlipidemia   . Osteoporosis   . Pneumonia   . Stroke ([place])   . Type II or unspecified type diabetes mellitus without mention of complication, uncontrolled     Past Surgical History:  Procedure Laterality Date  . ABDOMINAL HYSTERECTOMY    . CARPAL TUNNEL RELEASE     right  . COLONOSCOPY  12-10-10   per Dr. Deatra Ina, clear, repeat in 7 yrs   . INCISION AND DRAINAGE PERIRECTAL ABSCESS N/A 09/26/2015   Procedure: IRRIGATION AND DEBRIDEMENT PERIRECTAL ABSCESS;  Surgeon: Mickeal Skinner, MD;  Location: [facility];  Service: General;  Laterality: N/A;  . KNEE ARTHROSCOPY     right  . LOOP RECORDER INSERTION N/A 10/19/2016   Procedure: LOOP RECORDER INSERTION;  Surgeon: Thompson Grayer, MD;  Location: [facility];  Service: Cardiovascular;  Laterality: N/A;  . ORIF ANKLE FRACTURE Right 03/24/2012   Procedure: OPEN REDUCTION INTERNAL FIXATION (ORIF) ANKLE FRACTURE;  Surgeon: Newt Minion, MD;  Location: [facility];  Service: Orthopedics;  Laterality: Right;  Open Reduction Internal Fixation Right  Bimalleolar ankle fracture  . POLYPECTOMY    . TEE WITHOUT CARDIOVERSION N/A 10/18/2016   Procedure: TRANSESOPHAGEAL ECHOCARDIOGRAM (TEE);  Surgeon: Fay Records, MD;  Location: [facility];  Service: Cardiovascular;  Laterality: N/A;  . TONSILLECTOMY      There were no vitals filed for this visit.  Subjective Assessment - 12/07/16 1107    Subjective  Saw MD and he said she could start driving with someone in the car and "not far." Also said she could move into upstairs bedroom.     Patient is accompained by:  Family member    Pertinent History  HTN, HLD, DM, neurop, tobacco, COPD, OP (lives w/ mom; sister came to stay)    Limitations  Standing;Walking    How long can you stand comfortably?  15 min    Patient Stated Goals  to improve the use of her left hand and walk without a walker    Currently in Pain?  No/denies    Pain Onset  1 to 4 weeks ago         [facility] PT Assessment - 12/07/16 1115      Timed Up and Go Test   Normal TUG (seconds)  11.06 no device                  [facility]  Adult PT Treatment/Exercise - 12/07/16 1115      Transfers   Transfers  Sit to Stand;Stand to Sit    Sit to Stand  6: Modified independent (Device/Increase time)    Stand to Sit  6: Modified independent (Device/Increase time)    Comments  with or without rollator      Ambulation/Gait   Ambulation/Gait  Yes    Ambulation/Gait Assistance  6: Modified independent (Device/Increase time);4: Min guard    Ambulation Distance (Feet)  250 Feet rollator; 250 no device    Assistive device  None;Rollator    Gait Pattern  Step-through pattern;Decreased stride length;Poor foot clearance - left    Ambulation Surface  Indoor    Gait velocity  32.8/16.09=2.04 ft/sec rollator; no device 32.8/13.72=2.39 ft/sec    Stairs  Yes    Stairs Assistance  4: Min guard    Stairs Assistance Details (indicate cue type and reason)  quickly descends last 3-4 inches when lowering to next step; educated in using RLE to  lower (noticed same problem) educated to "reach for the step with your toes" so she contacts on the ball of her foot and then lowers to foot-flat    Stair Management Technique  One rail Right;Alternating pattern;Step to pattern;Forwards    Number of Stairs  8    Height of Stairs  6               Knee/Hip Exercises: Standing   Other Standing Knee Exercises  backward step up 6"; minguard due to imbalance (even with rail) x 3             PT Education - 12/07/16 1356    Education provided  Yes    Education Details  results of assessments for STGs; discussed LTGs for moving forward and likely will not need additional 4 weeks to meet these goals    Person(s) Educated  Patient    Methods  Explanation    Comprehension  Verbalized understanding       PT Short Term Goals - 12/07/16 1110      PT SHORT TERM GOAL #1   Title  Assess gait velocity and set STG and LTG as appropriate. (Target all STGs 12/08/16)    Baseline  11/16/16: met today with PT to set goals    Status  Achieved      PT SHORT TERM GOAL #2   Title  Patient will be independent with HEP for balance, strength, and gait training.     Time  4    Period  Weeks    Status  Achieved      PT SHORT TERM GOAL #3   Title  Patient will ambulate 250 ft on level/unlevel exterior surfaces with LRAD modified independent.     Baseline  12/07/16  250 ft x 2 level indoor surfaces due to weather    Time  4    Period  Weeks    Status  Unable to assess      PT SHORT TERM GOAL #4   Title  Patient will improve TUG to <15 seconds with LRAD.     Baseline  12/07/16 11.06 sec no device    Time  8    Period  Weeks    Status  Achieved        PT Long Term Goals - 12/07/16 2034      PT LONG TERM GOAL #1   Title  Patient will be independent with updated HEP for balance, strength  and ambulation. (Target date all LTGs 01/07/17)    Time  8    Period  Weeks    Status  On-going      PT LONG TERM GOAL #2   Title  Patient will verbalize  her plan for continued community-based activity plan to assist with reducing stroke risk factors.    Baseline  12/07/16 discussed joining gym vs walking at mall or other large stores    Time  8    Period  Weeks    Status  On-going      PT LONG TERM GOAL #3   Title  Patient will ambulate 1000 ft on outside surfaces with LRAD at modified independent level; 12/07/16 goal updated to no AD    Time  8    Period  Weeks    Status  On-going      PT LONG TERM GOAL #4   Title  Patient will improve TUG to <13.5 seconds with LRAD.     Baseline  12/07/16 11.06 sec    Time  8    Period  Weeks    Status  Achieved            Plan - 12/07/16 2025    Clinical Impression Statement  Session focused on assessment of STGs with pt meeting 3 of 4 goals with 4th goal unable to be assessed due to weather (outdoor, unlevel surface ambulation). Patient demonstrated significant progress (having already also achieved one LTG). She reports she is walking inside her home without her rollator and wants to be able to walk in the community without rollator. She also reports a continued area of concern/anxiety for herself when climbing/descending steps. Patient can continue to benefit from PT following PT POC with updated goals.     Rehab Potential  Good    PT Frequency  2x / week    PT Duration  8 weeks    PT Treatment/Interventions  ADLs/Self Care Home Management;DME Instruction;Gait training;Stair training;Functional mobility training;Orthotic Fit/Training;Patient/family education;Neuromuscular re-education;Balance training;Therapeutic exercise;Therapeutic activities;Passive range of motion    PT Next Visit Plan  Make of copy of LTGs for pt; Balance, LE strengthening, stair training with L or R handrail, work on gait velocity, lt heelstrike; outdoor ambulation with goal of no device    Consulted and Agree with Plan of Care  Patient       Patient will benefit from skilled therapeutic intervention in order to  improve the following deficits and impairments:  Decreased balance, Decreased knowledge of use of DME, Decreased mobility, Decreased strength, Difficulty walking, Impaired UE functional use, Impaired vision/preception, Obesity  Visit Diagnosis: Unsteadiness on feet  Other abnormalities of gait and mobility     Problem List Patient Active Problem List   Diagnosis Date Noted  . Hemiparesis affecting left side as late effect of cerebrovascular accident (CVA) ([place]) 11/11/2016  . Gait disturbance, post-stroke 11/11/2016  . Dysarthria as late effect of stroke 11/11/2016  . Carotid artery stenosis 11/02/2016  . Right middle cerebral artery stroke ([place]) 10/19/2016  . Left arm weakness   . Diastolic dysfunction   . Hypertension with heart disease   . Tobacco abuse   . Acute blood loss anemia   . Cerebral infarction due to embolism of right middle cerebral artery ([place])   . Stroke (cerebrum) ([place]) 10/15/2016  . Perirectal abscess 09/26/2015  . Vitamin D deficiency 07/23/2015  . Osteopenia 02/16/2013  . Ankle fracture 01/19/2013  . Anemia 09/27/2011  . Uncontrolled type 2 diabetes mellitus  with insulin therapy ([place]) 09/24/2011  . Hyperlipidemia 09/24/2011  . Hypertension 09/24/2011    Rexanne Mano, PT 12/07/2016, 8:40 PM  [facility] [address] [place], [place], [address] Phone: [phone]   Fax:  [phone]  Name: Sarah [name] MRN: [id number] Date of Birth: 12-21-1945

## 2016-12-07 NOTE — Patient Instructions (Addendum)
-   continue ASA and plavix for another 6 weeks, and then plavix alone - continue lipitor for stroke prevention - check BP and glucose at home and record - will increase amlodipine to 5 mg daily for better BP control - Follow up with your primary care physician for stroke risk factor modification. Recommend maintain blood pressure goal <130/80, diabetes with hemoglobin A1c goal below 7.0% and lipids with LDL cholesterol goal below 70 mg/dL.  - continue abstain from smoking - continue monitoring the loop recorder - continue PT/OT/speech therapy - diabetic diet and regular exercise - be careful and slow on going up to stairs. Avoid fall.  - No absolute restriction for driving, but recommend to drive with family members on board for 2-3 times initially. If both parties feel comfortable of your driving, you can drive alone after. However, you are recommended to drive during the day not at night, no long distance and drive in familiar roads. - follow up in 3 months

## 2016-12-07 NOTE — Progress Notes (Signed)
STROKE NEUROLOGY FOLLOW UP NOTE  NAME: Sarah [name] DOB: Nov 09, 1945  REASON FOR VISIT: stroke follow up HISTORY FROM: pt and chart  Today we had the pleasure of seeing BREONA CHERUBIN in follow-up at our Neurology Clinic. Pt was accompanied by daughter.   History Summary Ms. TRENIYA LOBB is a 71 y.o. female with history of DM, ongoing tobacco use, HLD, HTN admitted on 10/15/16 for left-sided weakness.   CT head showed focal hypodensity in the posterior right frontal WM deep white matter.  MRI showed multifocal acute infarct within the right MCA. MRA head distal right MCA M1 segment conclusion.  Carotid Doppler 40-59% right ICA stenosis.  However, CT of neck unremarkable.  EF 55-60%.  10/18/16 with worsening left sided weakness,  repeat MRI showed no significant change.  TEE unremarkable and loop recorder placed.  LDL 98 and A1c 12.9.  She was put on dual antiplatelet and Lipitor 40 on discharged to CIR.  Interval History During the interval time, the patient has been doing well.  Left sided weakness much improved.  Still has OT/PT/speech therapy as outpatient.  Has quit smoking.  BP still on the high side today in clinic 151/78.  Glucose not in good control, ranging from 200-270.  Patient has PCP follow-up tomorrow.  Patient complains of intermittent double vision, not sure if binocular or monocular.  Request for driving privilege.  REVIEW OF SYSTEMS: Full 14 system review of systems performed and notable only for those listed below and in HPI above, all others are negative:  Constitutional:   Cardiovascular: Murmur Ear/Nose/Throat:   Skin:  Eyes: Double vision Respiratory:   Gastroitestinal:   Genitourinary: Frequent urination Hematology/Lymphatic:   Endocrine:  Musculoskeletal:   Allergy/Immunology:   Neurological: Headache, speech difficulty Psychiatric:  Sleep:   The following represents the patient's updated allergies and side effects list: No Known  Allergies  The neurologically relevant items on the patient's problem list were reviewed on today's visit.  Neurologic Examination  A problem focused neurological exam (12 or more points of the single system neurologic examination, vital signs counts as 1 point, cranial nerves count for 8 points) was performed.  Blood pressure (!) 157/78, pulse 75, height 5' (1.524 m), weight 147 lb 11.2 oz (67 kg).  General - Well nourished, well developed, in no apparent distress.  Ophthalmologic - Sharp disc margins OU.  Cardiovascular - Regular rate and rhythm.  Mental Status -  Level of arousal and orientation to time, place, and person were intact. Language including expression, naming, repetition, comprehension was assessed and found intact.  Cranial Nerves II - XII - II - Visual field intact OU. III, IV, VI - Extraocular movements intact. V - Facial sensation intact bilaterally. VII - left mild facial droop. VIII - Hearing & vestibular intact bilaterally. X - Palate elevates symmetrically. XI - Chin turning & shoulder shrug intact bilaterally. XII - Tongue protrusion intact.  Motor Strength - The patient's strength was normal in all extremities except left UE 4/5 proximal and distal and pronator drift was present on the left.  Bulk was normal and fasciculations were absent.   Motor Tone - Muscle tone was assessed at the neck and appendages and was normal.  Reflexes - The patient's reflexes were 1+ in all extremities and she had no pathological reflexes.  Sensory - Light touch, temperature/pinprick were assessed and were normal.    Coordination - The patient had normal movements in the hands with no ataxia or dysmetria.  Tremor was absent.  Gait and Station - The patient's transfers, posture, gait, station, and turns were observed as normal.   Functional score  mRS = 1   0 - No symptoms.   1 - No significant disability. Able to carry out all usual activities, despite some  symptoms.   2 - Slight disability. Able to look after own affairs without assistance, but unable to carry out all previous activities.   3 - Moderate disability. Requires some help, but able to walk unassisted.   4 - Moderately severe disability. Unable to attend to own bodily needs without assistance, and unable to walk unassisted.   5 - Severe disability. Requires constant nursing care and attention, bedridden, incontinent.   6 - Dead.   NIH Stroke Scale  = 2  Level Of Consciousness 0=Alert; keenly responsive 1=Not alert, but arousable by minor stimulation 2=Not alert, requires repeated stimulation 3=Responds only with reflex movements 0  LOC Questions to Month and Age 31=Answers both questions correctly 1=Answers one question correctly 2=Answers neither question correctly 0  LOC Commands      -Open/Close eyes     -Open/close grip 0=Performs both tasks correctly 1=Performs one task correctly 2=Performs neighter task correctly 0  Best Gaze 0=Normal 1=Partial gaze palsy 2=Forced deviation, or total gaze paresis 0  Visual 0=No visual loss 1=Partial hemianopia 2=Complete hemianopia 3=Bilateral hemianopia (blind including cortical blindness) 0  Facial Palsy 0=Normal symmetrical movement 1=Minor paralysis (asymmetry) 2=Partial paralysis (lower face) 3=Complete paralysis (upper and lower face) 1  Motor  0=No drift, limb holds posture for full 10 seconds 1=Drift, limb holds posture, no drift to bed 2=Some antigravity effort, cannot maintain posture, drifts to bed 3=No effort against gravity, limb falls 4=No movement Right Arm 0     Leg 0    Left Arm 1     Leg 0  Limb Ataxia 0=Absent 1=Present in one limb 2=Present in two limbs 0  Sensory 0=Normal 1=Mild to moderate sensory loss 2=Severe to total sensory loss 0  Best Language 0=No aphasia, normal 1=Mild to moderate aphasia 2=Mute, global aphasia 3=Mute, global aphasia 0  Dysarthria 0=Normal 1=Mild to  moderate 2=Severe, unintelligible or mute/anarthric 0  Extinction/Neglect 0=No abnormality 1=Extinction to bilateral simultaneous stimulation 2=Profound neglect 0  Total   2     Data reviewed: I personally reviewed the images and agree with the radiology interpretations.  Ct Head Wo Contrast 10/15/2016 IMPRESSION:  1. Focal white matter hypodensity in the posterior right frontal deep white matter, new since 03/04/2007 brain MRI, potentially representing a subacute white matter infarct. Brain MRI correlation could be obtained as clinically warranted.  2. No mass-effect.  No acute intracranial hemorrhage.   Mr Jodene Nam Head Wo Contrast 10/16/2016  IMPRESSION:  1. Distal right middle cerebral artery M1 segment occlusion.  2. Multifocal acute ischemia within the right MCA distribution, with the largest area measuring 1.5 cm.  3. No acute hemorrhage or mass effect.   TTE - Left ventricle: The cavity size was normal. There was moderate focal basal hypertrophy of the septum. Systolic function was normal. The estimated ejection fraction was in the range of 55% to 60%. Wall motion was normal; there were no regional wall motion abnormalities. Features are consistent with a pseudonormal left ventricular filling pattern, with concomitant abnormal relaxation and increased filling pressure (grade 2 diastolic dysfunction). Doppler parameters are consistent with high ventricular filling pressure. - Aortic valve: Transvalvular velocity was within the normal range. There was no stenosis. There was no regurgitation. -  Mitral valve: Transvalvular velocity was within the normal range. There was no evidence for stenosis. There was trivial regurgitation. - Right ventricle: The cavity size was normal. Wall thickness was normal. Systolic function was normal. - Atrial septum: No defect or patent foramen ovale was identified. - Tricuspid valve: There was trivial regurgitation. -  Pulmonary arteries: Systolic pressure was within the normal range. PA peak pressure: 16 mm Hg (S).  Bilateral Carotid Dopplers - 40-59% right ICA and 1-39% left ICA stenosis  TEE LA, LA appendage without masses No PFO by color doppler or with injection of agitated saline  TV normal  AV mildly thickened. MV normal  LVEF normal Mild fixed atherosclerotic plaque in thoracic aorta   CTA neck  1. Normal CTA of the carotid and vertebral arterial systems. No embolic source is identified. 2. Minimal calcific aortic atherosclerosis (ICD10-I70.0).  MRI head limited 10/19/16 1. Slight enlargement of an acute infarct in the right centrum semiovale. 2. Unchanged size of other patchy evolving right MCA territory infarcts.  Component     Latest Ref Rng & Units 10/16/2016  Cholesterol     0 - 200 mg/dL 156  Triglycerides     <150 mg/dL 104  HDL Cholesterol     >40 mg/dL 37 (L)  Total CHOL/HDL Ratio     RATIO 4.2  VLDL     0 - 40 mg/dL 21  LDL (calc)     0 - 99 mg/dL 98  Hemoglobin A1C     4.8 - 5.6 % 12.9 (H)  Mean Plasma Glucose     mg/dL 323.53    Assessment: As you may recall, she is a 71 y.o. African American female with PMH of DM, ongoing tobacco use, HLD, HTN admitted on 10/15/16 for multifocal acute infarct within the right MCA. MRA head distal right MCA M1 segment conclusion.  Carotid Doppler 40-59% right ICA stenosis.  However, CT of neck unremarkable.  EF 55-60%.  10/18/16 with worsening left sided weakness,  repeat MRI showed no significant change.  TEE unremarkable and loop recorder placed.  LDL 98 and A1c 12.9.  She was put on dual antiplatelet and Lipitor 40 on discharged to CIR. Left sided weakness much improved.  Still has OT/PT/speech therapy as outpatient.  Has quit smoking.  BP and Glucose not in good control. Patient has PCP follow-up tomorrow.  Patient complains of intermittent double vision, not sure if binocular or monocular. Loop recorder so far no afib.  Plan:   - continue ASA and plavix for another 6 weeks, and then plavix alone - continue lipitor for stroke prevention - check BP and glucose at home and record - will increase amlodipine to 5 mg daily for better BP control - Follow up with your primary care physician for stroke risk factor modification. Recommend maintain blood pressure goal <130/80, diabetes with hemoglobin A1c goal below 7.0% and lipids with LDL cholesterol goal below 70 mg/dL.  - continue abstain from smoking - continue monitoring the loop recorder - continue PT/OT/speech therapy - diabetic diet and regular exercise - No absolute restriction for driving, but recommend to drive with family members on board for 2-3 times initially. If both parties feel comfortable of your driving, you can drive alone after. However, you are recommended to drive during the day not at night, no long distance and drive in familiar roads. - follow up in 3 months  I spent more than 25 minutes of face to face time with the patient. Greater than 50%  of time was spent in counseling and coordination of care.   No orders of the defined types were placed in this encounter.   Meds ordered this encounter  Medications  . amLODipine (NORVASC) 5 MG tablet    Sig: Take 1 tablet (5 mg total) by mouth daily.    Dispense:  90 tablet    Refill:  1    Patient Instructions  - continue ASA and plavix for another 6 weeks, and then plavix alone - continue lipitor for stroke prevention - check BP and glucose at home and record - will increase amlodipine to 5 mg daily for better BP control - Follow up with your primary care physician for stroke risk factor modification. Recommend maintain blood pressure goal <130/80, diabetes with hemoglobin A1c goal below 7.0% and lipids with LDL cholesterol goal below 70 mg/dL.  - continue abstain from smoking - continue monitoring the loop recorder - continue PT/OT/speech therapy - diabetic diet and regular exercise - be  careful and slow on going up to stairs. Avoid fall.  - No absolute restriction for driving, but recommend to drive with family members on board for 2-3 times initially. If both parties feel comfortable of your driving, you can drive alone after. However, you are recommended to drive during the day not at night, no long distance and drive in familiar roads. - follow up in 3 months   Rosalin Hawking, MD PhD [facility] [address], [place], [place] [address] [phone]

## 2016-12-08 ENCOUNTER — Ambulatory Visit (INDEPENDENT_AMBULATORY_CARE_PROVIDER_SITE_OTHER): Payer: Medicare Other | Admitting: Family Medicine

## 2016-12-08 ENCOUNTER — Encounter: Payer: Self-pay | Admitting: Family Medicine

## 2016-12-08 ENCOUNTER — Other Ambulatory Visit: Payer: Self-pay | Admitting: *Deleted

## 2016-12-08 VITALS — BP 155/80 | HR 73 | Temp 98.8°F | Resp 12 | Ht 60.0 in | Wt 146.4 lb

## 2016-12-08 DIAGNOSIS — D62 Acute posthemorrhagic anemia: Secondary | ICD-10-CM | POA: Diagnosis not present

## 2016-12-08 DIAGNOSIS — E1165 Type 2 diabetes mellitus with hyperglycemia: Secondary | ICD-10-CM

## 2016-12-08 DIAGNOSIS — I639 Cerebral infarction, unspecified: Secondary | ICD-10-CM | POA: Diagnosis not present

## 2016-12-08 DIAGNOSIS — I119 Hypertensive heart disease without heart failure: Secondary | ICD-10-CM | POA: Diagnosis not present

## 2016-12-08 DIAGNOSIS — Z794 Long term (current) use of insulin: Secondary | ICD-10-CM

## 2016-12-08 DIAGNOSIS — IMO0002 Reserved for concepts with insufficient information to code with codable children: Secondary | ICD-10-CM

## 2016-12-08 LAB — BASIC METABOLIC PANEL
BUN: 11 mg/dL (ref 6–23)
CO2: 29 mEq/L (ref 19–32)
Calcium: 9.6 mg/dL (ref 8.4–10.5)
Chloride: 104 mEq/L (ref 96–112)
Creatinine, Ser: 0.54 mg/dL (ref 0.40–1.20)
GFR: 143.81 mL/min (ref >60.00)
Glucose, Bld: 316 mg/dL — ABNORMAL HIGH (ref 70–99)
Potassium: 4 mEq/L (ref 3.5–5.1)
Sodium: 138 mEq/L (ref 135–145)

## 2016-12-08 LAB — CBC
HCT: 39.2 % (ref 36.0–46.0)
Hemoglobin: 12.8 g/dL (ref 12.0–15.0)
MCHC: 32.8 g/dL (ref 30.0–36.0)
MCV: 94.6 fl (ref 78.0–100.0)
Platelets: 307 10*3/uL (ref 150.0–400.0)
RBC: 4.14 Mil/uL (ref 3.87–5.11)
RDW: 13.6 % (ref 11.5–15.5)
WBC: 5.5 10*3/uL (ref 4.0–10.5)

## 2016-12-08 MED ORDER — ATORVASTATIN CALCIUM 40 MG PO TABS: 40.0000 mg | ORAL_TABLET | Freq: Every day | ORAL | 1 refills | Status: DC

## 2016-12-08 MED ORDER — CLOPIDOGREL BISULFATE 75 MG PO TABS: 75.0000 mg | ORAL_TABLET | Freq: Every day | ORAL | 0 refills | Status: DC

## 2016-12-08 MED ORDER — INSULIN GLARGINE 100 UNIT/ML SOLOSTAR PEN: PEN_INJECTOR | SUBCUTANEOUS | 11 refills | Status: DC

## 2016-12-08 MED ORDER — INSULIN GLARGINE 100 UNIT/ML SOLOSTAR PEN: PEN_INJECTOR | SUBCUTANEOUS | 3 refills | Status: DC

## 2016-12-08 MED ORDER — ASPIRIN 325 MG PO TBEC: 325.0000 mg | DELAYED_RELEASE_TABLET | Freq: Every day | ORAL | 0 refills | Status: DC

## 2016-12-08 NOTE — Patient Instructions (Addendum)
A few things to remember from today's visit:   Hypertension with heart disease - Plan: Basic metabolic panel  Acute blood loss anemia - Plan: CBC  Cerebrovascular accident (CVA), unspecified mechanism ([place]) - Plan: atorvastatin (LIPITOR) 40 MG tablet Lantus pm increased to 20 U.  Blood pressure check in 3 weeks. Continue checking blood pressures at home. Continue checking blood sugars at home, if more than 200 increase morning insulin from 15 units to 20 units. Fall prevention.   Please be sure medication list is accurate. If a new problem present, please set up appointment sooner than planned today.

## 2016-12-09 ENCOUNTER — Ambulatory Visit: Payer: Medicare Other | Admitting: Occupational Therapy

## 2016-12-09 ENCOUNTER — Other Ambulatory Visit: Payer: Self-pay | Admitting: Family Medicine

## 2016-12-09 ENCOUNTER — Ambulatory Visit: Payer: Medicare Other

## 2016-12-09 VITALS — BP 139/85 | HR 72

## 2016-12-09 DIAGNOSIS — R270 Ataxia, unspecified: Secondary | ICD-10-CM | POA: Diagnosis not present

## 2016-12-09 DIAGNOSIS — M6281 Muscle weakness (generalized): Secondary | ICD-10-CM | POA: Diagnosis not present

## 2016-12-09 DIAGNOSIS — I69354 Hemiplegia and hemiparesis following cerebral infarction affecting left non-dominant side: Secondary | ICD-10-CM

## 2016-12-09 DIAGNOSIS — R2681 Unsteadiness on feet: Secondary | ICD-10-CM | POA: Diagnosis not present

## 2016-12-09 DIAGNOSIS — I69318 Other symptoms and signs involving cognitive functions following cerebral infarction: Secondary | ICD-10-CM

## 2016-12-09 DIAGNOSIS — R2689 Other abnormalities of gait and mobility: Secondary | ICD-10-CM

## 2016-12-09 DIAGNOSIS — R278 Other lack of coordination: Secondary | ICD-10-CM | POA: Diagnosis not present

## 2016-12-09 NOTE — Therapy (Signed)
[facility] [address] [place], [place], [address] Phone: [phone]   Fax:  [phone]  Occupational Therapy Treatment  Patient Details  Name: Sarah [name] MRN: [id number] Date of Birth: 1945/05/15 Referring Provider: Dr. Letta Pate   Encounter Date: 12/09/2016  OT End of Session - 12/09/16 1241    Visit Number  3    Number of Visits  16    Date for OT Re-Evaluation  01/25/17    Authorization Type  [facility] will need PN and G code every 10th visit    Authorization Time Period  60 days    Authorization - Visit Number  3    Authorization - Number of Visits  10    OT Start Time  0800    OT Stop Time  0845    OT Time Calculation (min)  45 min    Activity Tolerance  Patient tolerated treatment well    Behavior During Therapy  [facility] for tasks assessed/performed       Past Medical History:  Diagnosis Date  . Boils   . Glaucoma   . Heart murmur   . HTN (hypertension)   . Hx of adenomatous colonic polyps   . Hyperlipidemia   . Osteoporosis   . Pneumonia   . Stroke ([place])   . Type II or unspecified type diabetes mellitus without mention of complication, uncontrolled     Past Surgical History:  Procedure Laterality Date  . ABDOMINAL HYSTERECTOMY    . CARPAL TUNNEL RELEASE     right  . COLONOSCOPY  12-10-10   per Dr. Deatra Ina, clear, repeat in 7 yrs   . INCISION AND DRAINAGE PERIRECTAL ABSCESS N/A 09/26/2015   Procedure: IRRIGATION AND DEBRIDEMENT PERIRECTAL ABSCESS;  Surgeon: Mickeal Skinner, MD;  Location: [facility];  Service: General;  Laterality: N/A;  . KNEE ARTHROSCOPY     right  . LOOP RECORDER INSERTION N/A 10/19/2016   Procedure: LOOP RECORDER INSERTION;  Surgeon: Thompson Grayer, MD;  Location: [facility];  Service: Cardiovascular;  Laterality: N/A;  . ORIF ANKLE FRACTURE Right 03/24/2012   Procedure: OPEN REDUCTION INTERNAL FIXATION (ORIF) ANKLE FRACTURE;  Surgeon: Newt Minion, MD;   Location: [facility];  Service: Orthopedics;  Laterality: Right;  Open Reduction Internal Fixation Right Bimalleolar ankle fracture  . POLYPECTOMY    . TEE WITHOUT CARDIOVERSION N/A 10/18/2016   Procedure: TRANSESOPHAGEAL ECHOCARDIOGRAM (TEE);  Surgeon: Fay Records, MD;  Location: [facility] ENDOS[facility];  Service: Cardiovascular;  Laterality: N/A;  . TONSILLECTOMY      There were no vitals filed for this visit.  Subjective Assessment - 12/09/16 0804    Currently in Pain?  No/denies                   OT Treatments/Exercises (OP) - 12/09/16 0001      ADLs   Cooking  Pt asked to cook boxed mac-n-cheese and egg. Pt with difficulty and needing intermittent mod cueing and assist for sequencing, planning ahead, gathering ingredients, problem solving (adjusting stove and timing appropriately) and for safety. Pt had to be reminded several times to keep one hand on stable surface with dynamic standing and functional mobility. Pt needed assist to adjust stove and use correct utensils over stove for safety. Pt also needed cues to turn on correct stove eye. Pt I'ly remembered to turn off one stove eye, but therapist had to turn off other eye. Pt also had to re-read instructions several times  and overall performed with decreased ability to alternate attention.  Recommended pt have direct supervision/assist with cooking and get family to take things off stove or out of oven. However, do think pt would have performed safer and with less cueing/assist with simple familiar single task item               OT Short Term Goals - 12/07/16 1348      OT SHORT TERM GOAL #1   Title  Pt will be mod I with HEP for coordination and grip/pinch strength LUE - 12/28/2016    Status  On-going      OT SHORT TERM GOAL #2   Title  Pt will demonstrate improved coordination in LUE as evidenced by decreasing time on 9 hole peg test by at least 20 seconds to assist with functional tasks.     Status  On-going      OT  SHORT TERM GOAL #3   Title  Pt will be min a for hot meal prep at ambulatory level and demonstrate good safety awareness    Status  On-going      OT SHORT TERM GOAL #4   Title  Pt will demonstrate improved grip strength LUE by at least 5 pounds to assist with opening jars (baseline = 10 pounds)    Status  On-going      OT SHORT TERM GOAL #5   Title  Pt will demonstrate improved coordination as evidenced by decreasing time on 9 hole peg test by at leats 20 seconds to assist with fine motor tasks (baseline = 1.04.47)    Status  On-going        OT Long Term Goals - 12/07/16 1348      OT LONG TERM GOAL #1   Title  Pt will be mod I with upgraded HEP - 01/25/2017    Status  On-going      OT LONG TERM GOAL #2   Title  Pt will demonstrate improved grip strength by at least 8 pounds in LUE to assist with home mgmt tasks (baseline = 10 pounds)    Status  On-going      OT LONG TERM GOAL #3   Title  Pt will demonstrate improved coordination as evidenced by decreasing time on 9 hole peg test by at least 25 seconds to reduce effort for buttoning, tying    Status  On-going      OT LONG TERM GOAL #4   Title  Pt will demonstrate ability to type simple email with extra time.     Status  On-going      OT LONG TERM GOAL #5   Title  Pt will be mod I with hot meal prep.    Status  On-going      OT LONG TERM GOAL #6   Title  Pt will be mod I with home mgmt tasks such as cleaning, laundry    Status  On-going      OT LONG TERM GOAL #7   Title  Pt will verbalize understanding of return to driving recommendations.     Status  On-going            Plan - 12/09/16 1242    Clinical Impression Statement  Pt required mod cueing and assist for cooking 2 items on stove, but feel this was too challenging at this time for pt. Pt would benefit from trying simple familiar 1 dish stovetop item and anticipate pt would have greater success with  this.     Rehab Potential  Good    OT Frequency  2x / week     OT Duration  8 weeks    OT Treatment/Interventions  Self-care/ADL training;Aquatic Therapy;Electrical Stimulation;Moist Heat;Ultrasound;Therapeutic exercise;Neuromuscular education;DME and/or AE instruction;Manual Therapy;Therapist, nutritional;Therapeutic activities;Cognitive remediation/compensation;Visual/perceptual remediation/compensation;Patient/family education;Balance training    Plan  continue functional mobility, in hand manipulation. Try simple familiar stovetop dish in a future session    Clinical Decision Making  Multiple treatment options, significant modification of task necessary    OT Home Exercise Plan  initiated putty (yellow) and FM coord    Consulted and Agree with Plan of Care  Patient       Patient will benefit from skilled therapeutic intervention in order to improve the following deficits and impairments:  Abnormal gait, Decreased activity tolerance, Decreased balance, Decreased cognition, Decreased mobility, Decreased knowledge of use of DME, Decreased coordination, Decreased strength, Impaired UE functional use, Impaired vision/preception  Visit Diagnosis: Hemiplegia and hemiparesis following cerebral infarction affecting left non-dominant side (HCC)  Unsteadiness on feet  Other symptoms and signs involving cognitive functions following cerebral infarction    Problem List Patient Active Problem List   Diagnosis Date Noted  . Hemiparesis affecting left side as late effect of cerebrovascular accident (CVA) ([place]) 11/11/2016  . Gait disturbance, post-stroke 11/11/2016  . Dysarthria as late effect of stroke 11/11/2016  . Carotid artery stenosis 11/02/2016  . Right middle cerebral artery stroke ([place]) 10/19/2016  . Left arm weakness   . Diastolic dysfunction   . Hypertension with heart disease   . Tobacco abuse   . Acute blood loss anemia   . Cerebral infarction due to embolism of right middle cerebral artery ([place])   . Stroke (cerebrum) ([place]) 10/15/2016   . Perirectal abscess 09/26/2015  . Vitamin D deficiency 07/23/2015  . Osteopenia 02/16/2013  . Ankle fracture 01/19/2013  . Anemia 09/27/2011  . Uncontrolled type 2 diabetes mellitus with insulin therapy ([place]) 09/24/2011  . Hyperlipidemia 09/24/2011  . Hypertension 09/24/2011    Carey Bullocks, OTR/L 12/09/2016, 12:44 PM  [facility] [address] [place], [place], [address] Phone: [phone]   Fax:  [phone]  Name: Sarah [name] MRN: [id number] Date of Birth: 13-Feb-1945

## 2016-12-09 NOTE — Therapy (Signed)
[facility] [address] [place], [place], [address] Phone: [phone]   Fax:  [phone]  Physical Therapy Treatment  Patient Details  Name: Sarah [name] MRN: [id number] Date of Birth: 1945-05-15 Referring Provider: Alysia Penna MD   Encounter Date: 12/09/2016  PT End of Session - 12/09/16 0950    Visit Number  6    Number of Visits  17    Date for PT Re-Evaluation  01/07/17    Authorization Type  [employer]    Authorization Time Period  11/08/16  to  01/07/17    PT Start Time  0850    PT Stop Time  0928    PT Time Calculation (min)  38 min    Equipment Utilized During Treatment  Gait belt    Activity Tolerance  Patient tolerated treatment well    Behavior During Therapy  [facility] for tasks assessed/performed       Past Medical History:  Diagnosis Date  . Boils   . Glaucoma   . Heart murmur   . HTN (hypertension)   . Hx of adenomatous colonic polyps   . Hyperlipidemia   . Osteoporosis   . Pneumonia   . Stroke ([place])   . Type II or unspecified type diabetes mellitus without mention of complication, uncontrolled     Past Surgical History:  Procedure Laterality Date  . ABDOMINAL HYSTERECTOMY    . CARPAL TUNNEL RELEASE     right  . COLONOSCOPY  12-10-10   per Dr. Deatra Ina, clear, repeat in 7 yrs   . INCISION AND DRAINAGE PERIRECTAL ABSCESS N/A 09/26/2015   Procedure: IRRIGATION AND DEBRIDEMENT PERIRECTAL ABSCESS;  Surgeon: Mickeal Skinner, MD;  Location: [facility];  Service: General;  Laterality: N/A;  . KNEE ARTHROSCOPY     right  . LOOP RECORDER INSERTION N/A 10/19/2016   Procedure: LOOP RECORDER INSERTION;  Surgeon: Thompson Grayer, MD;  Location: [facility];  Service: Cardiovascular;  Laterality: N/A;  . ORIF ANKLE FRACTURE Right 03/24/2012   Procedure: OPEN REDUCTION INTERNAL FIXATION (ORIF) ANKLE FRACTURE;  Surgeon: Newt Minion, MD;  Location: [facility];  Service: Orthopedics;   Laterality: Right;  Open Reduction Internal Fixation Right Bimalleolar ankle fracture  . POLYPECTOMY    . TEE WITHOUT CARDIOVERSION N/A 10/18/2016   Procedure: TRANSESOPHAGEAL ECHOCARDIOGRAM (TEE);  Surgeon: Fay Records, MD;  Location: [facility];  Service: Cardiovascular;  Laterality: N/A;  . TONSILLECTOMY      Vitals:   12/09/16 0856 12/09/16 0913 12/09/16 0916  BP: (!) 147/81 (!) 163/84 139/85  Pulse: 73 77 72    Subjective Assessment - 12/09/16 0853    Subjective  Pt denied falls since last visit. Pt reported MD incr. BP medication to 66m/day.     Pertinent History  HTN, HLD, DM, neurop, tobacco, COPD, OP (lives w/ mom; sister came to stay)    Patient Stated Goals  to improve the use of her left hand and walk without a walker    Currently in Pain?  No/denies                      [facility] Adult PT Treatment/Exercise - 12/09/16 0900      Ambulation/Gait   Ambulation/Gait  Yes    Ambulation/Gait Assistance  6: Modified independent (Device/Increase time);5: Supervision    Ambulation/Gait Assistance Details  Pt amb. indoors (due to cold weather) over mats and even surfaces, and ramps. Cues to improve L  heel strike. Pt required seated rest breaks 2/2 fatigue and to assess BP.    Ambulation Distance (Feet)  345 Feet , 75'x2 with rollator, 75', 20' no AD   Assistive device  Rollator    Gait Pattern  Step-through pattern;Decreased stride length;Poor foot clearance - left    Ambulation Surface  Level;Unlevel;Indoor red mat    Ramp  5: Supervision    Ramp Details (indicate cue type and reason)  Pt traversed indoor ramp x2 reps, with cues to improve ant/post weight shifting on incline/decline. S for safety.      High Level Balance   High Level Balance Activities  Head turns forward amb.    High Level Balance Comments  Performed over even/mat surfaces 4x15'/activity. Cues and demo for technique. Performed with min guard to min A for safety.             PT Education  - 12/09/16 0944    Education provided  Yes    Education Details  PT educated pt that fatigue is common after CVA, as pt asked why she's tired so often. PT provided pt with LTG handout per pt request.    Person(s) Educated  Patient    Methods  Explanation;Handout    Comprehension  Verbalized understanding       PT Short Term Goals - 12/07/16 1110      PT SHORT TERM GOAL #1   Title  Assess gait velocity and set STG and LTG as appropriate. (Target all STGs 12/08/16)    Baseline  11/16/16: met today with PT to set goals    Status  Achieved      PT SHORT TERM GOAL #2   Title  Patient will be independent with HEP for balance, strength, and gait training.     Time  4    Period  Weeks    Status  Achieved      PT SHORT TERM GOAL #3   Title  Patient will ambulate 250 ft on level/unlevel exterior surfaces with LRAD modified independent.     Baseline  12/07/16  250 ft x 2 level indoor surfaces due to weather    Time  4    Period  Weeks    Status  Unable to assess      PT SHORT TERM GOAL #4   Title  Patient will improve TUG to <15 seconds with LRAD.     Baseline  12/07/16 11.06 sec no device    Time  8    Period  Weeks    Status  Achieved        PT Long Term Goals - 12/07/16 2034      PT LONG TERM GOAL #1   Title  Patient will be independent with updated HEP for balance, strength and ambulation. (Target date all LTGs 01/07/17)    Time  8    Period  Weeks    Status  On-going      PT LONG TERM GOAL #2   Title  Patient will verbalize her plan for continued community-based activity plan to assist with reducing stroke risk factors.    Baseline  12/07/16 discussed joining gym vs walking at mall or other large stores    Time  8    Period  Weeks    Status  On-going      PT LONG TERM GOAL #3   Title  Patient will ambulate 1000 ft on outside surfaces with LRAD at modified independent level; 12/07/16 goal updated  to no AD    Time  8    Period  Weeks    Status  On-going      PT LONG  TERM GOAL #4   Title  Patient will improve TUG to <13.5 seconds with LRAD.     Baseline  12/07/16 11.06 sec    Time  8    Period  Weeks    Status  Achieved      PT LONG TERM GOAL #5   Title  Patient will ambulate up/down 4 steps with light use of rail and step-over-step sequencing.     Time  4    Period  Weeks    Status  New            Plan - 12/09/16 0950    Clinical Impression Statement  Session focused on gait training and high level balance activities. Pt requires min guard to min A during activities over compliant surfaces, especially while performing gait activities. Pt required seated rest breaks to allow systolic BP to decr. and 2/2 fatigue. PT will send note to MD regarding BP, as MD recently incr. pt's BP medication. Continue with POC.     Rehab Potential  Good    PT Frequency  2x / week    PT Duration  8 weeks    PT Treatment/Interventions  ADLs/Self Care Home Management;DME Instruction;Gait training;Stair training;Functional mobility training;Orthotic Fit/Training;Patient/family education;Neuromuscular re-education;Balance training;Therapeutic exercise;Therapeutic activities;Passive range of motion    PT Next Visit Plan   Balance, LE strengthening, stair training with L or R handrail, work on gait velocity, lt heelstrike; outdoor ambulation with goal of no device    Consulted and Agree with Plan of Care  Patient       Patient will benefit from skilled therapeutic intervention in order to improve the following deficits and impairments:  Decreased balance, Decreased knowledge of use of DME, Decreased mobility, Decreased strength, Difficulty walking, Impaired UE functional use, Impaired vision/preception, Obesity  Visit Diagnosis: Hemiplegia and hemiparesis following cerebral infarction affecting left non-dominant side (HCC)  Muscle weakness (generalized)  Other abnormalities of gait and mobility     Problem List Patient Active Problem List   Diagnosis Date Noted   . Hemiparesis affecting left side as late effect of cerebrovascular accident (CVA) ([place]) 11/11/2016  . Gait disturbance, post-stroke 11/11/2016  . Dysarthria as late effect of stroke 11/11/2016  . Carotid artery stenosis 11/02/2016  . Right middle cerebral artery stroke ([place]) 10/19/2016  . Left arm weakness   . Diastolic dysfunction   . Hypertension with heart disease   . Tobacco abuse   . Acute blood loss anemia   . Cerebral infarction due to embolism of right middle cerebral artery ([place])   . Stroke (cerebrum) ([place]) 10/15/2016  . Perirectal abscess 09/26/2015  . Vitamin D deficiency 07/23/2015  . Osteopenia 02/16/2013  . Ankle fracture 01/19/2013  . Anemia 09/27/2011  . Uncontrolled type 2 diabetes mellitus with insulin therapy ([place]) 09/24/2011  . Hyperlipidemia 09/24/2011  . Hypertension 09/24/2011    Dhiya Smits L 12/09/2016, 9:53 AM  [facility] [address] [place], [place], [address] Phone: [phone]   Fax:  [phone]  Name: Sarah [name] MRN: [id number] Date of Birth: 16-Jan-1945  Geoffry Paradise, PT,DPT 12/09/16 9:54 AM Phone: [phone] Fax: [phone]

## 2016-12-09 NOTE — Patient Instructions (Signed)
PT Long Term Goals - 12/07/16 2034            PT LONG TERM GOAL #1   Title  Patient will be independent with updated HEP for balance, strength and ambulation. (Target date all LTGs 01/07/17)    Time  8    Period  Weeks    Status  On-going        PT LONG TERM GOAL #2   Title  Patient will verbalize her plan for continued community-based activity plan to assist with reducing stroke risk factors.    Baseline  12/07/16 discussed joining gym vs walking at mall or other large stores    Time  8    Period  Weeks    Status  On-going        PT LONG TERM GOAL #3   Title  Patient will ambulate 1000 ft on outside surfaces with LRAD at modified independent level; 12/07/16 goal updated to no AD    Time  8    Period  Weeks    Status  On-going        PT LONG TERM GOAL #4   Title  Patient will improve TUG to <13.5 seconds with LRAD.     Baseline  12/07/16 11.06 sec    Time  8    Period  Weeks    Status  Achieved

## 2016-12-10 ENCOUNTER — Telehealth: Payer: Self-pay | Admitting: *Deleted

## 2016-12-10 ENCOUNTER — Encounter: Payer: Self-pay | Admitting: Physical Medicine & Rehabilitation

## 2016-12-10 ENCOUNTER — Ambulatory Visit: Payer: Medicare Other | Admitting: Physical Medicine & Rehabilitation

## 2016-12-10 VITALS — BP 133/75 | HR 80

## 2016-12-10 DIAGNOSIS — I69398 Other sequelae of cerebral infarction: Secondary | ICD-10-CM | POA: Diagnosis not present

## 2016-12-10 DIAGNOSIS — R488 Other symbolic dysfunctions: Secondary | ICD-10-CM

## 2016-12-10 DIAGNOSIS — R269 Unspecified abnormalities of gait and mobility: Secondary | ICD-10-CM

## 2016-12-10 DIAGNOSIS — E119 Type 2 diabetes mellitus without complications: Secondary | ICD-10-CM | POA: Diagnosis not present

## 2016-12-10 DIAGNOSIS — I69322 Dysarthria following cerebral infarction: Secondary | ICD-10-CM

## 2016-12-10 DIAGNOSIS — Z720 Tobacco use: Secondary | ICD-10-CM | POA: Diagnosis not present

## 2016-12-10 DIAGNOSIS — E785 Hyperlipidemia, unspecified: Secondary | ICD-10-CM | POA: Diagnosis not present

## 2016-12-10 DIAGNOSIS — I69354 Hemiplegia and hemiparesis following cerebral infarction affecting left non-dominant side: Secondary | ICD-10-CM | POA: Diagnosis not present

## 2016-12-10 DIAGNOSIS — I10 Essential (primary) hypertension: Secondary | ICD-10-CM | POA: Diagnosis not present

## 2016-12-10 NOTE — Telephone Encounter (Signed)
Looks like order has been placed.

## 2016-12-10 NOTE — Telephone Encounter (Addendum)
-----   Message from Charlett Blake, MD sent at 12/09/2016  3:41 PM EST ----- Please place order for speech therapy outpatient at neuro rehab ----- Message ----- From: Rivka Barbara, OT Sent: 11/30/2016   8:48 AM To: Charlett Blake, MD  Dr. Letta Pate, I am seeing this pt and she feels she is still having word finding issues as well as higher level cognitive issues. IF you agree could you please send a ST eval and Tx and we will get her scheduled. Thanks and Happy Thanksgiving! Santiago Glad

## 2016-12-10 NOTE — Progress Notes (Signed)
Subjective:    Patient ID: Sarah [name], female    DOB: July 01, 1945, 71 y.o.   MRN: [id number]  HPI  PCP is now writing meds Seen by PCP last Wed Speech feels a little slurred , slowed rate and some word finding deficits Still fatigues easily Bowel ok Nocturia, no pain with urination, discussed anticholinergic meds  Has low back pain , non radiating, no falls or trauma, no prior dugery Pain Inventory Average Pain 3 Pain Right Now 3 My pain is aching  In the last 24 hours, has pain interfered with the following? General activity 0 Relation with others 1 Enjoyment of life 1 What TIME of day is your pain at its worst? morning Sleep (in general) Good  Pain is worse with: bending and standing Pain improves with: rest and medication Relief from Meds: 1  Mobility walk with assistance use a walker ability to climb steps?  yes do you drive?  no  Function Do you have any goals in this area?  no  Neuro/Psych bladder control problems  Prior Studies Any changes since last visit?  no  Physicians involved in your care Any changes since last visit?  no   Family History  Problem Relation Age of Onset  . Diabetes Mother   . Hypertension Mother   . Stroke Father   . Stroke Maternal Uncle   . Stroke Paternal Uncle   . Colon cancer Neg Hx   . Esophageal cancer Neg Hx   . Rectal cancer Neg Hx   . Stomach cancer Neg Hx    Social History   Socioeconomic History  . Marital status: Single    Spouse name: None  . Number of children: None  . Years of education: None  . Highest education level: None  Social Needs  . Financial resource strain: None  . Food insecurity - worry: None  . Food insecurity - inability: None  . Transportation needs - medical: None  . Transportation needs - non-medical: None  Occupational History  . None  Tobacco Use  . Smoking status: Former Smoker    Types: Cigarettes    Last attempt to quit: 10/15/2016    Years since quitting: 0.1    . Smokeless tobacco: Never Used  . Tobacco comment: smokes occ.   Substance and Sexual Activity  . Alcohol use: Yes    Alcohol/week: 0.0 oz    Comment: occ  . Drug use: No  . Sexual activity: None  Other Topics Concern  . None  Social History Narrative  . None   Past Surgical History:  Procedure Laterality Date  . ABDOMINAL HYSTERECTOMY    . CARPAL TUNNEL RELEASE     right  . COLONOSCOPY  12-10-10   per Dr. Deatra Ina, clear, repeat in 7 yrs   . INCISION AND DRAINAGE PERIRECTAL ABSCESS N/A 09/26/2015   Procedure: IRRIGATION AND DEBRIDEMENT PERIRECTAL ABSCESS;  Surgeon: Mickeal Skinner, MD;  Location: [facility];  Service: General;  Laterality: N/A;  . KNEE ARTHROSCOPY     right  . LOOP RECORDER INSERTION N/A 10/19/2016   Procedure: LOOP RECORDER INSERTION;  Surgeon: Thompson Grayer, MD;  Location: [facility];  Service: Cardiovascular;  Laterality: N/A;  . ORIF ANKLE FRACTURE Right 03/24/2012   Procedure: OPEN REDUCTION INTERNAL FIXATION (ORIF) ANKLE FRACTURE;  Surgeon: Newt Minion, MD;  Location: [facility];  Service: Orthopedics;  Laterality: Right;  Open Reduction Internal Fixation Right Bimalleolar ankle fracture  . POLYPECTOMY    .  TEE WITHOUT CARDIOVERSION N/A 10/18/2016   Procedure: TRANSESOPHAGEAL ECHOCARDIOGRAM (TEE);  Surgeon: Fay Records, MD;  Location: [facility];  Service: Cardiovascular;  Laterality: N/A;  . TONSILLECTOMY     Past Medical History:  Diagnosis Date  . Boils   . Glaucoma   . Heart murmur   . HTN (hypertension)   . Hx of adenomatous colonic polyps   . Hyperlipidemia   . Osteoporosis   . Pneumonia   . Stroke ([place])   . Type II or unspecified type diabetes mellitus without mention of complication, uncontrolled    BP 133/75   Pulse 80   SpO2 96%   Opioid Risk Score:   Fall Risk Score:  `1  Depression screen PHQ 2/9  Depression screen [facility] 2/9 11/11/2016 09/06/2014 03/27/2013 01/19/2013  Decreased Interest 0 0 0 0  Down, Depressed, Hopeless 1 0  0 0  PHQ - 2 Score 1 0 0 0  Altered sleeping 0 - - -  Tired, decreased energy 1 - - -  Change in appetite 0 - - -  Feeling bad or failure about yourself  0 - - -  Trouble concentrating 1 - - -  Moving slowly or fidgety/restless 1 - - -  Suicidal thoughts 0 - - -  PHQ-9 Score 4 - - -  Difficult doing work/chores Somewhat difficult - - -     Review of Systems  Constitutional: Negative.   HENT: Negative.   Eyes: Negative.   Respiratory: Negative.   Cardiovascular: Negative.   Gastrointestinal: Negative.   Endocrine: Negative.   Genitourinary: Negative.   Musculoskeletal: Negative.   Skin: Negative.   Allergic/Immunologic: Negative.   Neurological: Negative.   Hematological: Negative.   Psychiatric/Behavioral: Negative.   All other systems reviewed and are negative.      Objective:   Physical Exam  Patient has tenderness palpation bilateral PSIS.  No pain with forward bending however pain when extending from a forward flexed position. Motor strength is 5/5 bilateral deltoid bicep tricep grip 4/5 left hip flexion knee extension ankle dorsiflexion 5/5 in the right hip flexion extension ankle dorsiflexion Standing balance is poor she is unable to stand with her feet together.  She needs walker for ambulation. She can walk with hand-held assist however with wide basis support and short step length. Patient is oriented x3 Mood and affect are appropriate      Assessment & Plan:  1.  History of right MCA stroke she has residual gait disorder left lower extremity weakness.  Dysarthria some word finding problems although based on lesion location should not have a aphasia. Have made referral to speech therapy to further evaluate. Continue outpatient PT OT 2.  Low back pain PSIS area most likely SI joint related, would continue PT No need for imaging Suspect that this is related to her stroke and gait disorder.  This should improve as her ambulation normalizes.  Localized care  should be appropriate such as heat, no nonsteroidal secondary to CVA  Physical medicine and rehabilitation follow-up in 6 weeks we will reevaluate back pain as well as her stroke symptoms.

## 2016-12-13 ENCOUNTER — Encounter: Payer: Self-pay | Admitting: Family Medicine

## 2016-12-14 ENCOUNTER — Ambulatory Visit: Payer: Medicare Other | Admitting: Physical Therapy

## 2016-12-14 ENCOUNTER — Ambulatory Visit: Payer: Medicare Other | Admitting: Occupational Therapy

## 2016-12-16 ENCOUNTER — Ambulatory Visit: Payer: Medicare Other | Admitting: Occupational Therapy

## 2016-12-16 ENCOUNTER — Telehealth: Payer: Self-pay | Admitting: Family Medicine

## 2016-12-16 ENCOUNTER — Ambulatory Visit: Payer: Medicare Other | Attending: Physical Medicine & Rehabilitation | Admitting: Physical Therapy

## 2016-12-16 ENCOUNTER — Encounter: Payer: Self-pay | Admitting: Occupational Therapy

## 2016-12-16 DIAGNOSIS — R2681 Unsteadiness on feet: Secondary | ICD-10-CM

## 2016-12-16 DIAGNOSIS — M6281 Muscle weakness (generalized): Secondary | ICD-10-CM | POA: Diagnosis not present

## 2016-12-16 DIAGNOSIS — I69318 Other symptoms and signs involving cognitive functions following cerebral infarction: Secondary | ICD-10-CM

## 2016-12-16 DIAGNOSIS — I69354 Hemiplegia and hemiparesis following cerebral infarction affecting left non-dominant side: Secondary | ICD-10-CM | POA: Insufficient documentation

## 2016-12-16 DIAGNOSIS — R41842 Visuospatial deficit: Secondary | ICD-10-CM

## 2016-12-16 DIAGNOSIS — R2689 Other abnormalities of gait and mobility: Secondary | ICD-10-CM | POA: Insufficient documentation

## 2016-12-16 DIAGNOSIS — R278 Other lack of coordination: Secondary | ICD-10-CM | POA: Diagnosis not present

## 2016-12-16 DIAGNOSIS — R471 Dysarthria and anarthria: Secondary | ICD-10-CM | POA: Insufficient documentation

## 2016-12-16 NOTE — Therapy (Signed)
[facility] [address] [place], [place], [address] Phone: [phone]   Fax:  [phone]  Occupational Therapy Treatment  Patient Details  Name: Sarah [name] MRN: [id number] Date of Birth: Apr 03, 1945 Referring Provider: Dr. Letta Pate   Encounter Date: 12/16/2016  OT End of Session - 12/16/16 1234    Visit Number  4    Number of Visits  16    Date for OT Re-Evaluation  01/25/17    Authorization Type  [facility] will need PN and G code every 10th visit    Authorization Time Period  60 days    Authorization - Visit Number  4    Authorization - Number of Visits  10    OT Start Time  2993    OT Stop Time  1059    OT Time Calculation (min)  44 min    Activity Tolerance  Patient tolerated treatment well       Past Medical History:  Diagnosis Date  . Boils   . Glaucoma   . Heart murmur   . HTN (hypertension)   . Hx of adenomatous colonic polyps   . Hyperlipidemia   . Osteoporosis   . Pneumonia   . Stroke ([place])   . Type II or unspecified type diabetes mellitus without mention of complication, uncontrolled     Past Surgical History:  Procedure Laterality Date  . ABDOMINAL HYSTERECTOMY    . CARPAL TUNNEL RELEASE     right  . COLONOSCOPY  12-10-10   per Dr. Deatra Ina, clear, repeat in 7 yrs   . INCISION AND DRAINAGE PERIRECTAL ABSCESS N/A 09/26/2015   Procedure: IRRIGATION AND DEBRIDEMENT PERIRECTAL ABSCESS;  Surgeon: Mickeal Skinner, MD;  Location: [facility];  Service: General;  Laterality: N/A;  . KNEE ARTHROSCOPY     right  . LOOP RECORDER INSERTION N/A 10/19/2016   Procedure: LOOP RECORDER INSERTION;  Surgeon: Thompson Grayer, MD;  Location: [facility];  Service: Cardiovascular;  Laterality: N/A;  . ORIF ANKLE FRACTURE Right 03/24/2012   Procedure: OPEN REDUCTION INTERNAL FIXATION (ORIF) ANKLE FRACTURE;  Surgeon: Newt Minion, MD;  Location: [facility];  Service: Orthopedics;  Laterality: Right;   Open Reduction Internal Fixation Right Bimalleolar ankle fracture  . POLYPECTOMY    . TEE WITHOUT CARDIOVERSION N/A 10/18/2016   Procedure: TRANSESOPHAGEAL ECHOCARDIOGRAM (TEE);  Surgeon: Fay Records, MD;  Location: [facility];  Service: Cardiovascular;  Laterality: N/A;  . TONSILLECTOMY      There were no vitals filed for this visit.  Subjective Assessment - 12/16/16 1022    Subjective   I was able to do some things in the kitchen for Thanksgiving with my family's help    Pertinent History  Pt with R CVA    Patient Stated Goals  use my hand better - I want to be able to cook    Currently in Pain?  No/denies                   OT Treatments/Exercises (OP) - 12/16/16 0001      Fine Motor Coordination   Other Fine Motor Exercises  Pt with slowly improving coordination - able to add to and upgrade HEP for coordination. Pt able to return demonstrate after signficant practice.  Therapeutic activities to address grip, pinch strength and coordination. Pt unable to do grooved peg board however as in hand manipulation remains significantly impaired.  Also upgraded putty from yellow to red. Pt verbalized  understanding of all upgrades.              OT Education - 12/16/16 1231    Education provided  Yes    Education Details  upgraded coordination and grip strength HEP    Person(s) Educated  Patient    Methods  Explanation;Demonstration;Handout    Comprehension  Verbalized understanding;Returned demonstration       OT Short Term Goals - 12/16/16 1231      OT SHORT TERM GOAL #1   Title  Pt will be mod I with HEP for coordination and grip/pinch strength LUE - 12/28/2016    Status  On-going      OT SHORT TERM GOAL #2   Title  Pt will demonstrate improved coordination in LUE as evidenced by decreasing time on 9 hole peg test by at least 20 seconds to assist with functional tasks. (baseline= 1.04.47)    Status  On-going      OT SHORT TERM GOAL #3   Title  Pt will be min  a for hot meal prep at ambulatory level and demonstrate good safety awareness    Status  On-going      OT SHORT TERM GOAL #4   Title  Pt will demonstrate improved grip strength LUE by at least 5 pounds to assist with opening jars (baseline = 10 pounds)    Status  On-going        OT Long Term Goals - 12/16/16 1232      OT LONG TERM GOAL #1   Title  Pt will be mod I with upgraded HEP - 01/25/2017    Status  On-going      OT LONG TERM GOAL #2   Title  Pt will demonstrate improved grip strength by at least 8 pounds in LUE to assist with home mgmt tasks (baseline = 10 pounds)    Status  On-going      OT LONG TERM GOAL #3   Title  Pt will demonstrate improved coordination as evidenced by decreasing time on 9 hole peg test by at least 25 seconds to reduce effort for buttoning, tying    Status  On-going      OT LONG TERM GOAL #4   Title  Pt will demonstrate ability to type simple email with extra time.     Status  On-going      OT LONG TERM GOAL #5   Title  Pt will be mod I with hot meal prep.    Status  On-going      OT LONG TERM GOAL #6   Title  Pt will be mod I with home mgmt tasks such as cleaning, laundry    Status  On-going      OT LONG TERM GOAL #7   Title  Pt will verbalize understanding of return to driving recommendations.     Status  On-going      OT LONG TERM GOAL #8   Title  Pt will demonstrate improved LUE functional use as evidenced by improving score on Box and Blocks by a least 4 blocks to assist with IADL's (baseline = 30)    Status  On-going            Plan - 12/16/16 1232    Clinical Impression Statement  Pt progressing toward goals. Pt demonstrating improvement in coordination and strength and reports she feels she can use the hand more at home.     Rehab Potential  Good    OT  Frequency  2x / week    OT Duration  8 weeks    OT Treatment/Interventions  Self-care/ADL training;Aquatic Therapy;Electrical Stimulation;Moist Heat;Ultrasound;Therapeutic  exercise;Neuromuscular education;DME and/or AE instruction;Manual Therapy;Therapist, nutritional;Therapeutic activities;Cognitive remediation/compensation;Visual/perceptual remediation/compensation;Patient/family education;Balance training    Plan  functional mobility, in hand manipulation, familar cooking task (i.e. eggs), grip strength, functional use of LUE    Consulted and Agree with Plan of Care  Patient       Patient will benefit from skilled therapeutic intervention in order to improve the following deficits and impairments:  Abnormal gait, Decreased activity tolerance, Decreased balance, Decreased cognition, Decreased mobility, Decreased knowledge of use of DME, Decreased coordination, Decreased strength, Impaired UE functional use, Impaired vision/preception  Visit Diagnosis: Hemiplegia and hemiparesis following cerebral infarction affecting left non-dominant side (HCC)  Unsteadiness on feet  Other symptoms and signs involving cognitive functions following cerebral infarction  Muscle weakness (generalized)  Other lack of coordination  Visuospatial deficit    Problem List Patient Active Problem List   Diagnosis Date Noted  . Hemiparesis affecting left side as late effect of cerebrovascular accident (CVA) ([place]) 11/11/2016  . Gait disturbance, post-stroke 11/11/2016  . Dysarthria as late effect of stroke 11/11/2016  . Carotid artery stenosis 11/02/2016  . Right middle cerebral artery stroke ([place]) 10/19/2016  . Left arm weakness   . Diastolic dysfunction   . Hypertension with heart disease   . Tobacco abuse   . Acute blood loss anemia   . Cerebral infarction due to embolism of right middle cerebral artery ([place])   . Stroke (cerebrum) ([place]) 10/15/2016  . Perirectal abscess 09/26/2015  . Vitamin D deficiency 07/23/2015  . Osteopenia 02/16/2013  . Ankle fracture 01/19/2013  . Anemia 09/27/2011  . Uncontrolled type 2 diabetes mellitus with insulin therapy ([place])  09/24/2011  . Hyperlipidemia 09/24/2011  . Hypertension 09/24/2011    Quay Burow, OTR/L 12/16/2016, 12:38 PM  [facility] [address] [place], [place], [address] Phone: [phone]   Fax:  [phone]  Name: Sarah [name] MRN: [id number] Date of Birth: November 26, 1945

## 2016-12-16 NOTE — Patient Instructions (Signed)
ANKLE: Dorsiflexion (Band)    Sit at edge of surface. Place band around top of left foot, then twist the loop and put other side over your right foot. Stretch the band tight by spreading your feet apart. Keeping heel on floor, raise toes of left foot. Hold __2_ seconds.  __20_ reps per set, __5_ sets per day, __7_ days per week  Copyright  VHI. All rights reserved.

## 2016-12-16 NOTE — Patient Instructions (Signed)
  Coordination Activities  Perform the following activities for 15-20 minutes 2  times per day with left hand(s).   Rotate ball in fingertips (clockwise and counter-clockwise).  Toss ball between hands.  Toss ball in air and catch with the same hand.  Flip cards 1 at a time as fast as you can.  Deal cards with your thumb (Hold deck in hand and push card off top with thumb).  Shuffle cards.  Pick up coins and place in container or coin bank.  Pick up coins and stack.  Pick up coins one at a time until you get 5-10 in your hand, then move coins from palm to fingertips to stack one at a time.  Twirl pen between fingers.  Screw together nuts and bolts, then unfasten.

## 2016-12-16 NOTE — Telephone Encounter (Signed)
RN called for recent Hgb A1C and BP

## 2016-12-17 NOTE — Therapy (Signed)
[facility] [address] [place], [place], [address] Phone: [phone]   Fax:  [phone]  Physical Therapy Treatment  Patient Details  Name: Sarah [name] MRN: [id number] Date of Birth: Aug 19, 1945 Referring Provider: Alysia Penna MD   Encounter Date: 12/16/2016  PT End of Session - 12/16/16 0947    Visit Number  7    Number of Visits  17    Date for PT Re-Evaluation  01/07/17    Authorization Type  [employer] Medicare    Authorization Time Period  11/08/16  to  01/07/17    PT Start Time  0945    PT Stop Time  1015    PT Time Calculation (min)  30 min    Equipment Utilized During Treatment  Gait belt    Activity Tolerance  Patient tolerated treatment well    Behavior During Therapy  [facility] for tasks assessed/performed       Past Medical History:  Diagnosis Date  . Boils   . Glaucoma   . Heart murmur   . HTN (hypertension)   . Hx of adenomatous colonic polyps   . Hyperlipidemia   . Osteoporosis   . Pneumonia   . Stroke ([place])   . Type II or unspecified type diabetes mellitus without mention of complication, uncontrolled     Past Surgical History:  Procedure Laterality Date  . ABDOMINAL HYSTERECTOMY    . CARPAL TUNNEL RELEASE     right  . COLONOSCOPY  12-10-10   per Dr. Deatra Ina, clear, repeat in 7 yrs   . INCISION AND DRAINAGE PERIRECTAL ABSCESS N/A 09/26/2015   Procedure: IRRIGATION AND DEBRIDEMENT PERIRECTAL ABSCESS;  Surgeon: Mickeal Skinner, MD;  Location: [facility];  Service: General;  Laterality: N/A;  . KNEE ARTHROSCOPY     right  . LOOP RECORDER INSERTION N/A 10/19/2016   Procedure: LOOP RECORDER INSERTION;  Surgeon: Thompson Grayer, MD;  Location: [facility];  Service: Cardiovascular;  Laterality: N/A;  . ORIF ANKLE FRACTURE Right 03/24/2012   Procedure: OPEN REDUCTION INTERNAL FIXATION (ORIF) ANKLE FRACTURE;  Surgeon: Newt Minion, MD;  Location: [facility];  Service: Orthopedics;   Laterality: Right;  Open Reduction Internal Fixation Right Bimalleolar ankle fracture  . POLYPECTOMY    . TEE WITHOUT CARDIOVERSION N/A 10/18/2016   Procedure: TRANSESOPHAGEAL ECHOCARDIOGRAM (TEE);  Surgeon: Fay Records, MD;  Location: [facility];  Service: Cardiovascular;  Laterality: N/A;  . TONSILLECTOMY      There were no vitals filed for this visit.  Subjective Assessment - 12/16/16 2018    Subjective  Pt denied falls since last visit. Using no device inside home.     Pertinent History  HTN, HLD, DM, neurop, tobacco, COPD, OP (lives w/ mom; sister came to stay)    Patient Stated Goals  to improve the use of her left hand and walk without a walker    Currently in Pain?  No/denies                      [facility] Adult PT Treatment/Exercise - 12/16/16 2019      Ambulation/Gait   Ambulation/Gait  Yes    Ambulation/Gait Assistance  6: Modified independent (Device/Increase time);5: Supervision    Ambulation/Gait Assistance Details  Ambulating indoors, level ground with turns (pt with slight drift); focus on weight shift over LLE and equal step length/stance time    Ambulation Distance (Feet)  345 Feet    Assistive device  Rollator;None    Gait Pattern  Step-through pattern;Decreased stride length;Poor foot clearance - left    Ambulation Surface  Level    Pre-Gait Activities  at counter with light RUE support, anterior-posterior wt-shifting progressing to stepping fwd-backward with LLE to emphasize lt foot clearance, loading response, through LLE stance      Ankle Exercises: Seated   Toe Raise  20 reps;2 seconds x 2 sets          Balance Exercises - 12/16/16 2027      Balance Exercises: Standing   Tandem Stance  Eyes open;Intermittent upper extremity support;3 reps;30 secs    Rockerboard  Anterior/posterior;EO;EC;30 seconds raising toes up x 10      OTAGO PROGRAM   Heel Walking  No support          PT Short Term Goals - 12/07/16 1110      PT SHORT TERM  GOAL #1   Title  Assess gait velocity and set STG and LTG as appropriate. (Target all STGs 12/08/16)    Baseline  11/16/16: met today with PT to set goals    Status  Achieved      PT SHORT TERM GOAL #2   Title  Patient will be independent with HEP for balance, strength, and gait training.     Time  4    Period  Weeks    Status  Achieved      PT SHORT TERM GOAL #3   Title  Patient will ambulate 250 ft on level/unlevel exterior surfaces with LRAD modified independent.     Baseline  12/07/16  250 ft x 2 level indoor surfaces due to weather    Time  4    Period  Weeks    Status  Unable to assess      PT SHORT TERM GOAL #4   Title  Patient will improve TUG to <15 seconds with LRAD.     Baseline  12/07/16 11.06 sec no device    Time  8    Period  Weeks    Status  Achieved        PT Long Term Goals - 12/07/16 2034      PT LONG TERM GOAL #1   Title  Patient will be independent with updated HEP for balance, strength and ambulation. (Target date all LTGs 01/07/17)    Time  8    Period  Weeks    Status  On-going      PT LONG TERM GOAL #2   Title  Patient will verbalize her plan for continued community-based activity plan to assist with reducing stroke risk factors.    Baseline  12/07/16 discussed joining gym vs walking at mall or other large stores    Time  8    Period  Weeks    Status  On-going      PT LONG TERM GOAL #3   Title  Patient will ambulate 1000 ft on outside surfaces with LRAD at modified independent level; 12/07/16 goal updated to no AD    Time  8    Period  Weeks    Status  On-going      PT LONG TERM GOAL #4   Title  Patient will improve TUG to <13.5 seconds with LRAD.     Baseline  12/07/16 11.06 sec    Time  8    Period  Weeks    Status  Achieved      PT LONG TERM GOAL #5  Title  Patient will ambulate up/down 4 steps with light use of rail and step-over-step sequencing.     Time  4    Period  Weeks    Status  New            Plan - 12/17/16  8768    Clinical Impression Statement  Session focused on gait, balance, and strength training to improve walking and decr fall risk. Noted decr Lt foot clearance and "foot slap" on lt as pt became more fatigued and added resisted DF to HEP. Patient continues to progress and can continue to benefit from PT.     Rehab Potential  Good    PT Frequency  2x / week    PT Duration  8 weeks    PT Treatment/Interventions  ADLs/Self Care Home Management;DME Instruction;Gait training;Stair training;Functional mobility training;Orthotic Fit/Training;Patient/family education;Neuromuscular re-education;Balance training;Therapeutic exercise;Therapeutic activities;Passive range of motion    PT Next Visit Plan   check HEP given 12/6; stair training with L or R handrail; Balance, LE strengthening, work on gait velocity, lt heelstrike; outdoor ambulation with goal of no device    Consulted and Agree with Plan of Care  Patient       Patient will benefit from skilled therapeutic intervention in order to improve the following deficits and impairments:  Decreased balance, Decreased knowledge of use of DME, Decreased mobility, Decreased strength, Difficulty walking, Impaired UE functional use, Impaired vision/preception, Obesity  Visit Diagnosis: Unsteadiness on feet  Muscle weakness (generalized)  Other abnormalities of gait and mobility     Problem List Patient Active Problem List   Diagnosis Date Noted  . Hemiparesis affecting left side as late effect of cerebrovascular accident (CVA) ([place]) 11/11/2016  . Gait disturbance, post-stroke 11/11/2016  . Dysarthria as late effect of stroke 11/11/2016  . Carotid artery stenosis 11/02/2016  . Right middle cerebral artery stroke ([place]) 10/19/2016  . Left arm weakness   . Diastolic dysfunction   . Hypertension with heart disease   . Tobacco abuse   . Acute blood loss anemia   . Cerebral infarction due to embolism of right middle cerebral artery ([place])   . Stroke  (cerebrum) ([place]) 10/15/2016  . Perirectal abscess 09/26/2015  . Vitamin D deficiency 07/23/2015  . Osteopenia 02/16/2013  . Ankle fracture 01/19/2013  . Anemia 09/27/2011  . Uncontrolled type 2 diabetes mellitus with insulin therapy ([place]) 09/24/2011  . Hyperlipidemia 09/24/2011  . Hypertension 09/24/2011    Rexanne Mano , PT 12/17/2016, 5:45 AM  [facility] [address] [place], [place], [address] Phone: [phone]   Fax:  [phone]  Name: Sarah [name] MRN: [id number] Date of Birth: 02/24/1945

## 2016-12-20 ENCOUNTER — Ambulatory Visit (INDEPENDENT_AMBULATORY_CARE_PROVIDER_SITE_OTHER): Payer: Medicare Other | Admitting: *Deleted

## 2016-12-20 DIAGNOSIS — I639 Cerebral infarction, unspecified: Secondary | ICD-10-CM

## 2016-12-20 NOTE — Progress Notes (Signed)
[facility] Summary Report / Loop Recorder 

## 2016-12-21 ENCOUNTER — Ambulatory Visit: Payer: Medicare Other | Admitting: Physical Therapy

## 2016-12-21 ENCOUNTER — Encounter: Payer: Medicare Other | Admitting: Occupational Therapy

## 2016-12-21 ENCOUNTER — Encounter: Payer: Medicare Other | Admitting: Speech Pathology

## 2016-12-22 ENCOUNTER — Telehealth: Payer: Self-pay | Admitting: Cardiology

## 2016-12-22 NOTE — Telephone Encounter (Signed)
LMOVM requesting that pt send manual transmission b/c home monitor has not updated in at least 14 days.    

## 2016-12-23 ENCOUNTER — Ambulatory Visit: Payer: Medicare Other | Admitting: Occupational Therapy

## 2016-12-23 ENCOUNTER — Ambulatory Visit: Payer: Medicare Other | Admitting: Physical Therapy

## 2016-12-23 ENCOUNTER — Encounter: Payer: Self-pay | Admitting: Physical Therapy

## 2016-12-23 ENCOUNTER — Encounter: Payer: Self-pay | Admitting: Occupational Therapy

## 2016-12-23 VITALS — BP 124/74

## 2016-12-23 DIAGNOSIS — M6281 Muscle weakness (generalized): Secondary | ICD-10-CM

## 2016-12-23 DIAGNOSIS — R2689 Other abnormalities of gait and mobility: Secondary | ICD-10-CM

## 2016-12-23 DIAGNOSIS — R471 Dysarthria and anarthria: Secondary | ICD-10-CM | POA: Diagnosis not present

## 2016-12-23 DIAGNOSIS — R2681 Unsteadiness on feet: Secondary | ICD-10-CM

## 2016-12-23 DIAGNOSIS — R278 Other lack of coordination: Secondary | ICD-10-CM | POA: Diagnosis not present

## 2016-12-23 DIAGNOSIS — R41842 Visuospatial deficit: Secondary | ICD-10-CM

## 2016-12-23 DIAGNOSIS — I69354 Hemiplegia and hemiparesis following cerebral infarction affecting left non-dominant side: Secondary | ICD-10-CM

## 2016-12-23 DIAGNOSIS — I69318 Other symptoms and signs involving cognitive functions following cerebral infarction: Secondary | ICD-10-CM

## 2016-12-23 NOTE — Patient Instructions (Addendum)
Trunk: Rotation    Lie on back on firm, flat surface with knees bent. Keep head and shoulders flat, slowly lower knees to floor to one side. May use extra pillows beside you to support your knees or lie on the couch and rest knees against the back of the couch.  May also do with legs straight. Lift one at a time up and across to touch floor. Hold ___30_ seconds. Repeat __2-3__ times, alternating sides. Do __1-2__ sessions per day. CAUTION: Movement should be gentle, steady and slow.  Copyright  VHI. All rights reserved.

## 2016-12-23 NOTE — Therapy (Signed)
[facility] [address] [place], [place], [address] Phone: [phone]   Fax:  [phone]  Occupational Therapy Treatment  Patient Details  Name: Sarah [name] MRN: [id number] Date of Birth: 07/26/1945 Referring Provider (Historical): Dr. Letta Pate   Encounter Date: 12/23/2016  OT End of Session - 12/23/16 1530    Visit Number  5    Number of Visits  16    Date for OT Re-Evaluation  01/25/17    Authorization Type  [facility] will need PN and G code every 10th visit    Authorization Time Period  60 days    Authorization - Visit Number  5    Authorization - Number of Visits  10    OT Start Time  1017    OT Stop Time  1100    OT Time Calculation (min)  43 min    Activity Tolerance  Patient tolerated treatment well       Past Medical History:  Diagnosis Date  . Boils   . Glaucoma   . Heart murmur   . HTN (hypertension)   . Hx of adenomatous colonic polyps   . Hyperlipidemia   . Osteoporosis   . Pneumonia   . Stroke ([place])   . Type II or unspecified type diabetes mellitus without mention of complication, uncontrolled     Past Surgical History:  Procedure Laterality Date  . ABDOMINAL HYSTERECTOMY    . CARPAL TUNNEL RELEASE     right  . COLONOSCOPY  12-10-10   per Dr. Deatra Ina, clear, repeat in 7 yrs   . INCISION AND DRAINAGE PERIRECTAL ABSCESS N/A 09/26/2015   Procedure: IRRIGATION AND DEBRIDEMENT PERIRECTAL ABSCESS;  Surgeon: Mickeal Skinner, MD;  Location: [facility];  Service: General;  Laterality: N/A;  . KNEE ARTHROSCOPY     right  . LOOP RECORDER INSERTION N/A 10/19/2016   Procedure: LOOP RECORDER INSERTION;  Surgeon: Thompson Grayer, MD;  Location: [facility];  Service: Cardiovascular;  Laterality: N/A;  . ORIF ANKLE FRACTURE Right 03/24/2012   Procedure: OPEN REDUCTION INTERNAL FIXATION (ORIF) ANKLE FRACTURE;  Surgeon: Newt Minion, MD;  Location: [facility];  Service: Orthopedics;   Laterality: Right;  Open Reduction Internal Fixation Right Bimalleolar ankle fracture  . POLYPECTOMY    . TEE WITHOUT CARDIOVERSION N/A 10/18/2016   Procedure: TRANSESOPHAGEAL ECHOCARDIOGRAM (TEE);  Surgeon: Fay Records, MD;  Location: [facility];  Service: Cardiovascular;  Laterality: N/A;  . TONSILLECTOMY      There were no vitals filed for this visit.  Subjective Assessment - 12/23/16 1025    Subjective   I can't believe I could do that with my left hand    Pertinent History  Pt with R CVA    Patient Stated Goals  use my hand better - I want to be able to cook    Currently in Pain?  Yes    Pain Score  7     Pain Location  Back    Pain Orientation  Right    Pain Descriptors / Indicators  Spasm    Pain Type  Chronic pain    Pain Onset  More than a month ago    Pain Frequency  Intermittent    Aggravating Factors   feels like spasm in my back, sometimes a dull pain    Pain Relieving Factors  usually moving around helps but today it isn't going away.  OT Treatments/Exercises (OP) - 12/23/16 0001      Neurological Re-education Exercises   Other Exercises 1  Neuro re ed to address fine motor and in hand manipulation tasks (grooved peg and O'Connor tweezer without use of tweezers). Focus was on in hand manipulation, orientation of hand and fingers to task, and decreasing abnormal movement patterns proximally to compensate for distal control. Pt with sigifnicant improvement in this with repetition.  Began to also address isolated finger movement in L hand as prep for typing.                OT Short Term Goals - 12/23/16 1526      OT SHORT TERM GOAL #1   Title  Pt will be mod I with HEP for coordination and grip/pinch strength LUE - 12/28/2016    Status  Achieved      OT SHORT TERM GOAL #2   Title  Pt will demonstrate improved coordination in LUE as evidenced by decreasing time on 9 hole peg test by at least 20 seconds to assist with  functional tasks. (baseline= 1.04.47)    Status  On-going      OT SHORT TERM GOAL #3   Title  Pt will be min a for hot meal prep at ambulatory level and demonstrate good safety awareness    Status  On-going      OT SHORT TERM GOAL #4   Title  Pt will demonstrate improved grip strength LUE by at least 5 pounds to assist with opening jars (baseline = 10 pounds)    Status  Achieved 12/23/2016  30 pounds        OT Long Term Goals - 12/23/16 1527      OT LONG TERM GOAL #1   Title  Pt will be mod I with upgraded HEP - 01/25/2017    Status  On-going      OT LONG TERM GOAL #2   Title  Pt will demonstrate improved grip strength to at least 38 pounds in LUE to assist with home mgmt tasks (baseline = 10 pounds)    Status  Revised 12/23/2016  30 pounds therefore met original goal.   revised to above goal.      OT LONG TERM GOAL #3   Title  Pt will demonstrate improved coordination as evidenced by decreasing time on 9 hole peg test by at least 25 seconds to reduce effort for buttoning, tying    Status  On-going      OT LONG TERM GOAL #4   Title  Pt will demonstrate ability to type simple email with extra time.     Status  On-going      OT LONG TERM GOAL #5   Title  Pt will be mod I with hot meal prep.    Status  On-going      OT LONG TERM GOAL #6   Title  Pt will be mod I with home mgmt tasks such as cleaning, laundry    Status  On-going      OT LONG TERM GOAL #7   Title  Pt will verbalize understanding of return to driving recommendations.     Status  On-going      OT LONG TERM GOAL #8   Title  Pt will demonstrate improved LUE functional use as evidenced by improving score on Box and Blocks by a least 4 blocks to assist with IADL's (baseline = 30)    Status  On-going  Plan - 12/23/16 1529    Clinical Impression Statement  Pt with improving fine motor control, motor planning and functional use of L hand.     Rehab Potential  Good    OT Frequency  2x / week    OT  Duration  8 weeks    OT Treatment/Interventions  Self-care/ADL training;Aquatic Therapy;Electrical Stimulation;Moist Heat;Ultrasound;Therapeutic exercise;Neuromuscular education;DME and/or AE instruction;Manual Therapy;Therapist, nutritional;Therapeutic activities;Cognitive remediation/compensation;Visual/perceptual remediation/compensation;Patient/family education;Balance training    Plan  functional mobility, in hand manipulation, familiar cooking task (i.e. eggs), grip strength, functional use of LUE, work toward typing    Consulted and Agree with Plan of Care  Patient       Patient will benefit from skilled therapeutic intervention in order to improve the following deficits and impairments:  Abnormal gait, Decreased activity tolerance, Decreased balance, Decreased cognition, Decreased mobility, Decreased knowledge of use of DME, Decreased coordination, Decreased strength, Impaired UE functional use, Impaired vision/preception  Visit Diagnosis: Hemiplegia and hemiparesis following cerebral infarction affecting left non-dominant side (HCC)  Unsteadiness on feet  Other symptoms and signs involving cognitive functions following cerebral infarction  Muscle weakness (generalized)  Other lack of coordination  Visuospatial deficit    Problem List Patient Active Problem List   Diagnosis Date Noted  . Hemiparesis affecting left side as late effect of cerebrovascular accident (CVA) ([place]) 11/11/2016  . Gait disturbance, post-stroke 11/11/2016  . Dysarthria as late effect of stroke 11/11/2016  . Carotid artery stenosis 11/02/2016  . Right middle cerebral artery stroke ([place]) 10/19/2016  . Left arm weakness   . Diastolic dysfunction   . Hypertension with heart disease   . Tobacco abuse   . Acute blood loss anemia   . Cerebral infarction due to embolism of right middle cerebral artery ([place])   . Stroke (cerebrum) ([place]) 10/15/2016  . Perirectal abscess 09/26/2015  . Vitamin D  deficiency 07/23/2015  . Osteopenia 02/16/2013  . Ankle fracture 01/19/2013  . Anemia 09/27/2011  . Uncontrolled type 2 diabetes mellitus with insulin therapy ([place]) 09/24/2011  . Hyperlipidemia 09/24/2011  . Hypertension 09/24/2011    Quay Burow, OTR/L 12/23/2016, 3:31 PM  [facility] [address] [place], [place], [address] Phone: [phone]   Fax:  [phone]  Name: SHANI FITCH MRN: [id number] Date of Birth: Oct 06, 1945

## 2016-12-24 NOTE — Therapy (Signed)
[facility] [address] [place], [place], [address] Phone: [phone]   Fax:  [phone]  Physical Therapy Treatment  Patient Details  Name: Sarah [name] MRN: [id number] Date of Birth: 08/16/1945 Referring Provider: Alysia Penna MD   Encounter Date: 12/23/2016  PT End of Session - 12/24/16 0743    Visit Number  8    Number of Visits  17    Date for PT Re-Evaluation  01/07/17    Authorization Type  [employer] Medicare    Authorization Time Period  11/08/16  to  01/07/17    PT Start Time  0930    PT Stop Time  1015    PT Time Calculation (min)  45 min    Equipment Utilized During Treatment  --    Activity Tolerance  Patient limited by pain    Behavior During Therapy  [facility] for tasks assessed/performed       Past Medical History:  Diagnosis Date  . Boils   . Glaucoma   . Heart murmur   . HTN (hypertension)   . Hx of adenomatous colonic polyps   . Hyperlipidemia   . Osteoporosis   . Pneumonia   . Stroke ([place])   . Type II or unspecified type diabetes mellitus without mention of complication, uncontrolled     Past Surgical History:  Procedure Laterality Date  . ABDOMINAL HYSTERECTOMY    . CARPAL TUNNEL RELEASE     right  . COLONOSCOPY  12-10-10   per Dr. Deatra Ina, clear, repeat in 7 yrs   . INCISION AND DRAINAGE PERIRECTAL ABSCESS N/A 09/26/2015   Procedure: IRRIGATION AND DEBRIDEMENT PERIRECTAL ABSCESS;  Surgeon: Mickeal Skinner, MD;  Location: [facility];  Service: General;  Laterality: N/A;  . KNEE ARTHROSCOPY     right  . LOOP RECORDER INSERTION N/A 10/19/2016   Procedure: LOOP RECORDER INSERTION;  Surgeon: Thompson Grayer, MD;  Location: [facility];  Service: Cardiovascular;  Laterality: N/A;  . ORIF ANKLE FRACTURE Right 03/24/2012   Procedure: OPEN REDUCTION INTERNAL FIXATION (ORIF) ANKLE FRACTURE;  Surgeon: Newt Minion, MD;  Location: [facility];  Service: Orthopedics;  Laterality: Right;   Open Reduction Internal Fixation Right Bimalleolar ankle fracture  . POLYPECTOMY    . TEE WITHOUT CARDIOVERSION N/A 10/18/2016   Procedure: TRANSESOPHAGEAL ECHOCARDIOGRAM (TEE);  Surgeon: Fay Records, MD;  Location: [facility];  Service: Cardiovascular;  Laterality: N/A;  . TONSILLECTOMY      Vitals:   12/23/16 0927 12/23/16 0935 12/23/16 0940  BP: (!) 159/79 128/74 124/74    Subjective Assessment - 12/23/16 0927    Subjective  my back is hurting more today. No problems with walking without device indoors.     Pertinent History  HTN, HLD, DM, neurop, tobacco, COPD, OP (lives w/ mom; sister came to stay)    Patient Stated Goals  to improve the use of her left hand and walk without a walker    Currently in Pain?  Yes    Pain Score  5     Pain Location  Back    Pain Orientation  Right    Pain Descriptors / Indicators  Spasm    Pain Type  Chronic pain    Pain Onset  More than a month ago    Pain Frequency  Intermittent                      [facility] Adult PT Treatment/Exercise - 12/24/16  0001      Ambulation/Gait   Ambulation/Gait  Yes    Ambulation/Gait Assistance  5: Supervision    Ambulation/Gait Assistance Details  Educating on use of SPC with various tips (quad rubber, triangular, Hurrycane); max progressing to min cues for sequencing with cane; continued cues for heelstrike, step length and velocity    Ambulation Distance (Feet)  600 Feet seated rest breaks due to back pain and fatigue    Assistive device  Straight cane    Gait Pattern  Step-through pattern;Decreased stride length;Poor foot clearance - left;Decreased dorsiflexion - left;Antalgic    Ambulation Surface  Level      Exercises   Exercises  Lumbar      Lumbar Exercises: Stretches   Single Knee to Chest Stretch  1 rep;30 seconds    Single Knee to Chest Stretch Limitations  did not quite get to the point in her back that is spasming    Lower Trunk Rotation  2 reps;30 seconds    Lower Trunk Rotation  Limitations  with PT supporting legs 8-10" from surface to allow muscle relaxation while stretching      Knee/Hip Exercises: Aerobic   Nustep  L4 x 3 minutes x 4 extremities; warm-up prior to stretching             PT Education - 12/24/16 0742    Education provided  Yes    Education Details  see pt instructions    Person(s) Educated  Patient    Methods  Explanation;Demonstration;Handout    Comprehension  Verbalized understanding;Returned demonstration;Need further instruction       PT Short Term Goals - 12/07/16 1110      PT SHORT TERM GOAL #1   Title  Assess gait velocity and set STG and LTG as appropriate. (Target all STGs 12/08/16)    Baseline  11/16/16: met today with PT to set goals    Status  Achieved      PT SHORT TERM GOAL #2   Title  Patient will be independent with HEP for balance, strength, and gait training.     Time  4    Period  Weeks    Status  Achieved      PT SHORT TERM GOAL #3   Title  Patient will ambulate 250 ft on level/unlevel exterior surfaces with LRAD modified independent.     Baseline  12/07/16  250 ft x 2 level indoor surfaces due to weather    Time  4    Period  Weeks    Status  Unable to assess      PT SHORT TERM GOAL #4   Title  Patient will improve TUG to <15 seconds with LRAD.     Baseline  12/07/16 11.06 sec no device    Time  8    Period  Weeks    Status  Achieved        PT Long Term Goals - 12/07/16 2034      PT LONG TERM GOAL #1   Title  Patient will be independent with updated HEP for balance, strength and ambulation. (Target date all LTGs 01/07/17)    Time  8    Period  Weeks    Status  On-going      PT LONG TERM GOAL #2   Title  Patient will verbalize her plan for continued community-based activity plan to assist with reducing stroke risk factors.    Baseline  12/07/16 discussed joining gym vs walking at mall or  other large stores    Time  8    Period  Weeks    Status  On-going      PT LONG TERM GOAL #3   Title   Patient will ambulate 1000 ft on outside surfaces with LRAD at modified independent level; 12/07/16 goal updated to no AD    Time  8    Period  Weeks    Status  On-going      PT LONG TERM GOAL #4   Title  Patient will improve TUG to <13.5 seconds with LRAD.     Baseline  12/07/16 11.06 sec    Time  8    Period  Weeks    Status  Achieved      PT LONG TERM GOAL #5   Title  Patient will ambulate up/down 4 steps with light use of rail and step-over-step sequencing.     Time  4    Period  Weeks    Status  New            Plan - 12/24/16 0745    Clinical Impression Statement  Session focused on gait training with [facility] (goal for pt to use when outside her home). Ambulation primarily limited by back pain with rest periods and stretching used throughout session. Educated patient on paying for cane out-of-pocket due to insurance paid for her rollator. Provided local DME store locations and phone numbers for pt to seek specialty tip for [employer]. Patient can continue to benefit from PT to progress towards LTGs and pt's personal goal to walk without her walker.     Rehab Potential  Good    PT Frequency  2x / week    PT Duration  8 weeks    PT Treatment/Interventions  ADLs/Self Care Home Management;DME Instruction;Gait training;Stair training;Functional mobility training;Orthotic Fit/Training;Patient/family education;Neuromuscular re-education;Balance training;Therapeutic exercise;Therapeutic activities;Passive range of motion    PT Next Visit Plan   cont gait training with "Hurrycane" vs SPC with triangle tip (including outdoors as approp); stair training with L or R handrail; Balance, LE strengthening, work on gait velocity, lt heelstrike; outdoor ambulation     Consulted and Agree with Plan of Care  Patient       Patient will benefit from skilled therapeutic intervention in order to improve the following deficits and impairments:  Decreased balance, Decreased knowledge of use of DME, Decreased  mobility, Decreased strength, Difficulty walking, Impaired UE functional use, Impaired vision/preception, Obesity  Visit Diagnosis: Unsteadiness on feet  Muscle weakness (generalized)  Other abnormalities of gait and mobility     Problem List Patient Active Problem List   Diagnosis Date Noted  . Hemiparesis affecting left side as late effect of cerebrovascular accident (CVA) ([place]) 11/11/2016  . Gait disturbance, post-stroke 11/11/2016  . Dysarthria as late effect of stroke 11/11/2016  . Carotid artery stenosis 11/02/2016  . Right middle cerebral artery stroke ([place]) 10/19/2016  . Left arm weakness   . Diastolic dysfunction   . Hypertension with heart disease   . Tobacco abuse   . Acute blood loss anemia   . Cerebral infarction due to embolism of right middle cerebral artery ([place])   . Stroke (cerebrum) ([place]) 10/15/2016  . Perirectal abscess 09/26/2015  . Vitamin D deficiency 07/23/2015  . Osteopenia 02/16/2013  . Ankle fracture 01/19/2013  . Anemia 09/27/2011  . Uncontrolled type 2 diabetes mellitus with insulin therapy ([place]) 09/24/2011  . Hyperlipidemia 09/24/2011  . Hypertension 09/24/2011    Rexanne Mano, PT 12/24/2016,  7:55 AM  [facility] [address] [place], [place], [address] Phone: [phone]   Fax:  [phone]  Name: ANYELY CUNNING MRN: [id number] Date of Birth: 11-Nov-1945

## 2016-12-27 LAB — CUP PACEART REMOTE DEVICE CHECK
Date Time Interrogation Session: 20181208203619
Implantable Pulse Generator Implant Date: 20181009

## 2016-12-28 ENCOUNTER — Ambulatory Visit: Payer: Medicare Other

## 2016-12-28 ENCOUNTER — Encounter: Payer: Self-pay | Admitting: Physical Therapy

## 2016-12-28 ENCOUNTER — Ambulatory Visit: Payer: Medicare Other | Admitting: Occupational Therapy

## 2016-12-28 ENCOUNTER — Encounter: Payer: Self-pay | Admitting: Occupational Therapy

## 2016-12-28 ENCOUNTER — Ambulatory Visit: Payer: Medicare Other | Admitting: Physical Therapy

## 2016-12-28 DIAGNOSIS — R2689 Other abnormalities of gait and mobility: Secondary | ICD-10-CM | POA: Diagnosis not present

## 2016-12-28 DIAGNOSIS — I69318 Other symptoms and signs involving cognitive functions following cerebral infarction: Secondary | ICD-10-CM | POA: Diagnosis not present

## 2016-12-28 DIAGNOSIS — R471 Dysarthria and anarthria: Secondary | ICD-10-CM | POA: Diagnosis not present

## 2016-12-28 DIAGNOSIS — M6281 Muscle weakness (generalized): Secondary | ICD-10-CM | POA: Diagnosis not present

## 2016-12-28 DIAGNOSIS — R2681 Unsteadiness on feet: Secondary | ICD-10-CM | POA: Diagnosis not present

## 2016-12-28 DIAGNOSIS — I69354 Hemiplegia and hemiparesis following cerebral infarction affecting left non-dominant side: Secondary | ICD-10-CM | POA: Diagnosis not present

## 2016-12-28 DIAGNOSIS — R41842 Visuospatial deficit: Secondary | ICD-10-CM

## 2016-12-28 DIAGNOSIS — R278 Other lack of coordination: Secondary | ICD-10-CM

## 2016-12-28 NOTE — Therapy (Signed)
[facility] [address] [place], [place], [address] Phone: [phone]   Fax:  [phone]  Occupational Therapy Treatment  Patient Details  Name: Sarah [name] MRN: [id number] Date of Birth: [birth year] Referring Provider (Historical): Dr. Letta Pate   Encounter Date: 12/28/2016  OT End of Session - 12/28/16 1032    Visit Number  6    Number of Visits  16    Date for OT Re-Evaluation  01/25/17    Authorization Type  [facility] will need PN and G code every 10th visit    Authorization Time Period  60 days    Authorization - Visit Number  6    Authorization - Number of Visits  10    OT Start Time  6962    OT Stop Time  0930    OT Time Calculation (min)  43 min    Activity Tolerance  Patient tolerated treatment well       Past Medical History:  Diagnosis Date  . Boils   . Glaucoma   . Heart murmur   . HTN (hypertension)   . Hx of adenomatous colonic polyps   . Hyperlipidemia   . Osteoporosis   . Pneumonia   . Stroke ([place])   . Type II or unspecified type diabetes mellitus without mention of complication, uncontrolled     Past Surgical History:  Procedure Laterality Date  . ABDOMINAL HYSTERECTOMY    . CARPAL TUNNEL RELEASE     right  . COLONOSCOPY  12-10-10   per Dr. Deatra Ina, clear, repeat in 7 yrs   . INCISION AND DRAINAGE PERIRECTAL ABSCESS N/A 09/26/2015   Procedure: IRRIGATION AND DEBRIDEMENT PERIRECTAL ABSCESS;  Surgeon: Mickeal Skinner, MD;  Location: [facility];  Service: General;  Laterality: N/A;  . KNEE ARTHROSCOPY     right  . LOOP RECORDER INSERTION N/A 10/19/2016   Procedure: LOOP RECORDER INSERTION;  Surgeon: Thompson Grayer, MD;  Location: [facility];  Service: Cardiovascular;  Laterality: N/A;  . ORIF ANKLE FRACTURE Right 03/24/2012   Procedure: OPEN REDUCTION INTERNAL FIXATION (ORIF) ANKLE FRACTURE;  Surgeon: Newt Minion, MD;  Location: [facility];  Service: Orthopedics;   Laterality: Right;  Open Reduction Internal Fixation Right Bimalleolar ankle fracture  . POLYPECTOMY    . TEE WITHOUT CARDIOVERSION N/A 10/18/2016   Procedure: TRANSESOPHAGEAL ECHOCARDIOGRAM (TEE);  Surgeon: Fay Records, MD;  Location: [facility];  Service: Cardiovascular;  Laterality: N/A;  . TONSILLECTOMY      There were no vitals filed for this visit.  Subjective Assessment - 12/28/16 0848    Subjective   I am doing more things at home like some laundry and the dishes.    Pertinent History  Pt with R CVA    Patient Stated Goals  use my hand better - I want to be able to cook    Currently in Pain?  No/denies back is just stiff today not true pain                    OT Treatments/Exercises (OP) - 12/28/16 0001      ADLs   Cooking  Addressed simple familiar hot meal prep (scrambled eggs), serving to table and cleaning up kitchen.  Pt demonstrated good safety awareness (turrned off stove, awareness of using L hand around hot items, walker safety).  Pt with much improved balance, organization and basic sequencing with this task.  Pt able to use LUE functionally in this  task as non dominant.  Pt reports that she is doing some limited cooking at home with help from family.  In discussion pt could verbalize that she feels at this point she could do most things except drain heavy pot of water or take heavy casserole out of oven (showing good insight and safety awareness).  Pt reports that she is also starting to do some simple home mgmt tasks such as laundry and dishes at home.      ADL Comments  Checked STG's - pt has met or exceeded all goals. See goals section for updates.        Neurological Re-education Exercises   Other Exercises 1  Neuro re ed to address dynamic standing balance without a device through functional ambulation incorporating head turns and head nods with functional mobility.  Pt with mild disquilibrium with head nods and requred close supervision for functional  ambulation at more normal speed without device.  Also addressed in hand manipulation tasks/hand orientation for functional use of LUE.                OT Short Term Goals - 12/28/16 1027      OT SHORT TERM GOAL #1   Title  Pt will be mod I with HEP for coordination and grip/pinch strength LUE - 12/28/2016    Status  Achieved      OT SHORT TERM GOAL #2   Title  Pt will demonstrate improved coordination in LUE as evidenced by decreasing time on 9 hole peg test by at least 20 seconds to assist with functional tasks. (baseline= 1.04.47)    Status  Achieved 12/28/2016  2 trials 49.20 and then 30.20      OT SHORT TERM GOAL #3   Title  Pt will be min a for hot meal prep at ambulatory level and demonstrate good safety awareness    Status  Achieved      OT SHORT TERM GOAL #4   Title  Pt will demonstrate improved grip strength LUE by at least 5 pounds to assist with opening jars (baseline = 10 pounds)    Status  Achieved 12/23/2016  30 pounds        OT Long Term Goals - 12/28/16 1029      OT LONG TERM GOAL #1   Title  Pt will be mod I with upgraded HEP - 01/25/2017    Status  On-going      OT LONG TERM GOAL #2   Title  Pt will demonstrate improved grip strength to at least 38 pounds in LUE to assist with home mgmt tasks (baseline = 10 pounds)    Status  On-going 12/23/2016  30 pounds therefore met original goal.   revised to above goal.      OT LONG TERM GOAL #3   Title  Pt will demonstrate improved coordination as evidenced by decreasing time on 9 hole peg test by at least 25 seconds to reduce effort for buttoning, tying    Status  On-going 12/28/2016  2 trials 49.2 and 30.20 will monitor for consistent performance      OT LONG TERM GOAL #4   Title  Pt will demonstrate ability to type simple email with extra time.     Status  On-going      OT LONG TERM GOAL #5   Title  Pt will be mod I with hot meal prep.    Status  On-going      OT LONG TERM GOAL #  6   Title  Pt will be  mod I with home mgmt tasks such as cleaning, laundry    Status  On-going 12/28/2016 pt reports that she is doing parts of laundry with family's help, dishes and some vacuuming      OT LONG TERM GOAL #7   Title  Pt will verbalize understanding of return to driving recommendations.     Status  On-going      OT LONG TERM GOAL #8   Title  Pt will demonstrate improved LUE functional use as evidenced by improving score on Box and Blocks by a least 4 blocks to assist with IADL's (baseline = 30)    Status  On-going            Plan - 12/28/16 1030    Clinical Impression Statement  Pt continues to make excellent progress toward goals - pt demonstrating significant improvement in balance and functional use of L hand today    Rehab Potential  Good    OT Frequency  2x / week    OT Duration  8 weeks    OT Treatment/Interventions  Self-care/ADL training;Aquatic Therapy;Electrical Stimulation;Moist Heat;Ultrasound;Therapeutic exercise;Neuromuscular education;DME and/or AE instruction;Manual Therapy;Therapist, nutritional;Therapeutic activities;Cognitive remediation/compensation;Visual/perceptual remediation/compensation;Patient/family education;Balance training    Plan  functional mobility, balance, in hand manipulation, grip strength, functional use of L hand, home mgmt tasks, work toward typing    OT Home Exercise Plan  upgraded putty to green 12/28/2016    Consulted and Agree with Plan of Care  Patient       Patient will benefit from skilled therapeutic intervention in order to improve the following deficits and impairments:  Abnormal gait, Decreased activity tolerance, Decreased balance, Decreased cognition, Decreased mobility, Decreased knowledge of use of DME, Decreased coordination, Decreased strength, Impaired UE functional use, Impaired vision/preception  Visit Diagnosis: Hemiplegia and hemiparesis following cerebral infarction affecting left non-dominant side (HCC)  Unsteadiness on  feet  Other symptoms and signs involving cognitive functions following cerebral infarction  Muscle weakness (generalized)  Other lack of coordination  Visuospatial deficit    Problem List Patient Active Problem List   Diagnosis Date Noted  . Hemiparesis affecting left side as late effect of cerebrovascular accident (CVA) ([place]) 11/11/2016  . Gait disturbance, post-stroke 11/11/2016  . Dysarthria as late effect of stroke 11/11/2016  . Carotid artery stenosis 11/02/2016  . Right middle cerebral artery stroke ([place]) 10/19/2016  . Left arm weakness   . Diastolic dysfunction   . Hypertension with heart disease   . Tobacco abuse   . Acute blood loss anemia   . Cerebral infarction due to embolism of right middle cerebral artery ([place])   . Stroke (cerebrum) ([place]) 10/15/2016  . Perirectal abscess 09/26/2015  . Vitamin D deficiency 07/23/2015  . Osteopenia 02/16/2013  . Ankle fracture 01/19/2013  . Anemia 09/27/2011  . Uncontrolled type 2 diabetes mellitus with insulin therapy ([place]) 09/24/2011  . Hyperlipidemia 09/24/2011  . Hypertension 09/24/2011    Quay Burow, OTR/L 12/28/2016, 10:34 AM  [facility] [address] [place], [place], [address] Phone: [phone]   Fax:  [phone]  Name: Sarah [name] MRN: [id number] Date of Birth: 06/16/1945

## 2016-12-28 NOTE — Therapy (Signed)
[facility] [address] [place], [place], [address] Phone: [phone]   Fax:  [phone]  Physical Therapy Treatment  Patient Details  Name: Sarah [name] MRN: [id number] Date of Birth: [birth year] Referring Provider: Alysia Penna MD   Encounter Date: 12/28/2016  PT End of Session - 12/28/16 1850    Visit Number  9    Number of Visits  17    Date for PT Re-Evaluation  01/07/17    Authorization Type  [employer] Medicare    Authorization Time Period  11/08/16  to  01/07/17    PT Start Time  0933    PT Stop Time  1015    PT Time Calculation (min)  42 min    Activity Tolerance  Patient limited by pain    Behavior During Therapy  [facility] for tasks assessed/performed       Past Medical History:  Diagnosis Date  . Boils   . Glaucoma   . Heart murmur   . HTN (hypertension)   . Hx of adenomatous colonic polyps   . Hyperlipidemia   . Osteoporosis   . Pneumonia   . Stroke ([place])   . Type II or unspecified type diabetes mellitus without mention of complication, uncontrolled     Past Surgical History:  Procedure Laterality Date  . ABDOMINAL HYSTERECTOMY    . CARPAL TUNNEL RELEASE     right  . COLONOSCOPY  12-10-10   per Dr. Deatra Ina, clear, repeat in 7 yrs   . INCISION AND DRAINAGE PERIRECTAL ABSCESS N/A 09/26/2015   Procedure: IRRIGATION AND DEBRIDEMENT PERIRECTAL ABSCESS;  Surgeon: Mickeal Skinner, MD;  Location: [facility];  Service: General;  Laterality: N/A;  . KNEE ARTHROSCOPY     right  . LOOP RECORDER INSERTION N/A 10/19/2016   Procedure: LOOP RECORDER INSERTION;  Surgeon: Thompson Grayer, MD;  Location: [facility];  Service: Cardiovascular;  Laterality: N/A;  . ORIF ANKLE FRACTURE Right 03/24/2012   Procedure: OPEN REDUCTION INTERNAL FIXATION (ORIF) ANKLE FRACTURE;  Surgeon: Newt Minion, MD;  Location: [facility];  Service: Orthopedics;  Laterality: Right;  Open Reduction Internal Fixation Right  Bimalleolar ankle fracture  . POLYPECTOMY    . TEE WITHOUT CARDIOVERSION N/A 10/18/2016   Procedure: TRANSESOPHAGEAL ECHOCARDIOGRAM (TEE);  Surgeon: Fay Records, MD;  Location: [facility];  Service: Cardiovascular;  Laterality: N/A;  . TONSILLECTOMY      There were no vitals filed for this visit.  Subjective Assessment - 12/28/16 0936    Subjective  Feeling better. Balance seems better. [facility].    Pertinent History  HTN, HLD, DM, neurop, tobacco, COPD, OP (lives w/ mom; sister came to stay)    Patient Stated Goals  to improve the use of her left hand and walk without a walker    Currently in Pain?  No/denies    Pain Onset  --                      [facility] Adult PT Treatment/Exercise - 12/28/16 1016      Ambulation/Gait   Ambulation/Gait Assistance  5: Supervision    Ambulation/Gait Assistance Details  attempted use of cane with again poor sequencing and decr velocity and appearing more unsteady with cane than without; remainder of session with no device; vc to incr and maintain velocity    Ambulation Distance (Feet)  400 Feet  indoors; 300 outdoors, sidewalk    Assistive device  Straight cane;None    Gait Pattern  Step-through pattern;Decreased stride length;Poor foot clearance - left;Decreased dorsiflexion - left;Decreased arm swing - right;Decreased arm swing - left    Ambulation Surface  Level;Indoor;Outdoor;Paved    Stairs Assistance  6: Modified independent (Device/Increase time);4: Min guard minguard carrying laundry basket (empty)    Stairs Assistance Details (indicate cue type and reason)  using rt rail lightly, vc when descending to reach for next step with her toes and then slowly lower to flat foot; with laundry basket attempted putting basket down on 2 steps above, steppping up 1 step and then moving basket up again, however this was uncomfortable on her back    Stair Management Technique   One rail Right;Alternating pattern;Forwards    Number of Stairs  8 just rail; x 8 carrying basket    Height of Stairs  6      Knee/Hip Exercises: Aerobic   Tread Mill  3 minutes at 1.8 mph holding with bil UEs; vc for lt foot clearance      Knee/Hip Exercises: Machines for Strengthening   Total Gym Leg Press  40# x 15 reps with slow controlled movements          Balance Exercises - 12/28/16 1847      Balance Exercises: Standing   Step Ups  Forward;6 inch;UE support 1 and backwards with controlled descent    Gait with Head Turns  Forward lt/rt and up/down maintaining self-selected speed and path        PT Education - 12/28/16 1848    Education provided  Yes    Education Details  with someone present, ambulate without device in community; importance of continuing her exercises/activity upon d/c    Person(s) Educated  Patient    Methods  Explanation    Comprehension  Verbalized understanding       PT Short Term Goals - 12/07/16 1110      PT SHORT TERM GOAL #1   Title  Assess gait velocity and set STG and LTG as appropriate. (Target all STGs 12/08/16)    Baseline  11/16/16: met today with PT to set goals    Status  Achieved      PT SHORT TERM GOAL #2   Title  Patient will be independent with HEP for balance, strength, and gait training.     Time  4    Period  Weeks    Status  Achieved      PT SHORT TERM GOAL #3   Title  Patient will ambulate 250 ft on level/unlevel exterior surfaces with LRAD modified independent.     Baseline  12/07/16  250 ft x 2 level indoor surfaces due to weather    Time  4    Period  Weeks    Status  Unable to assess      PT SHORT TERM GOAL #4   Title  Patient will improve TUG to <15 seconds with LRAD.     Baseline  12/07/16 11.06 sec no device    Time  8    Period  Weeks    Status  Achieved        PT Long Term Goals - 12/07/16 2034      PT LONG TERM GOAL #1   Title  Patient will be independent with updated HEP for balance, strength  and ambulation. (Target date all LTGs 01/07/17)    Time  8  Period  Weeks    Status  On-going      PT LONG TERM GOAL #2   Title  Patient will verbalize her plan for continued community-based activity plan to assist with reducing stroke risk factors.    Baseline  12/07/16 discussed joining gym vs walking at mall or other large stores    Time  8    Period  Weeks    Status  On-going      PT LONG TERM GOAL #3   Title  Patient will ambulate 1000 ft on outside surfaces with LRAD at modified independent level; 12/07/16 goal updated to no AD    Time  8    Period  Weeks    Status  On-going      PT LONG TERM GOAL #4   Title  Patient will improve TUG to <13.5 seconds with LRAD.     Baseline  12/07/16 11.06 sec    Time  8    Period  Weeks    Status  Achieved      PT LONG TERM GOAL #5   Title  Patient will ambulate up/down 4 steps with light use of rail and step-over-step sequencing.     Time  4    Period  Weeks    Status  New            Plan - 12/28/16 1851    Clinical Impression Statement  Session focused on gait with and without SPC (pt does better without the cane--has trouble coordinating with cane). Began to discuss [facility]. Pt aware that her insurance offers a program that covers the cost of her membership. Discussed plan to check LTGs next visit, repeat ambulation tasks while carrying objects and determining if ready for discharge or requires re-certification for additional PT.     Rehab Potential  Good    PT Frequency  2x / week    PT Duration  8 weeks    PT Treatment/Interventions  ADLs/Self Care Home Management;DME Instruction;Gait training;Stair training;Functional mobility training;Orthotic Fit/Training;Patient/family education;Neuromuscular re-education;Balance training;Therapeutic exercise;Therapeutic activities;Passive range of motion    PT Next Visit Plan  check LTGs next visit, repeat ambulation tasks while  carrying objects and determining if ready for discharge or requires re-certification for additional PT.    Consulted and Agree with Plan of Care  Patient       Patient will benefit from skilled therapeutic intervention in order to improve the following deficits and impairments:  Decreased balance, Decreased knowledge of use of DME, Decreased mobility, Decreased strength, Difficulty walking, Impaired UE functional use, Impaired vision/preception, Obesity  Visit Diagnosis: Unsteadiness on feet  Muscle weakness (generalized)  Other abnormalities of gait and mobility     Problem List Patient Active Problem List   Diagnosis Date Noted  . Hemiparesis affecting left side as late effect of cerebrovascular accident (CVA) ([place]) 11/11/2016  . Gait disturbance, post-stroke 11/11/2016  . Dysarthria as late effect of stroke 11/11/2016  . Carotid artery stenosis 11/02/2016  . Right middle cerebral artery stroke ([place]) 10/19/2016  . Left arm weakness   . Diastolic dysfunction   . Hypertension with heart disease   . Tobacco abuse   . Acute blood loss anemia   . Cerebral infarction due to embolism of right middle cerebral artery ([place])   . Stroke (cerebrum) ([place]) 10/15/2016  . Perirectal abscess 09/26/2015  . Vitamin D deficiency 07/23/2015  . Osteopenia 02/16/2013  . Ankle fracture 01/19/2013  .  Anemia 09/27/2011  . Uncontrolled type 2 diabetes mellitus with insulin therapy ([place]) 09/24/2011  . Hyperlipidemia 09/24/2011  . Hypertension 09/24/2011    Rexanne Mano, PT 12/28/2016, 7:02 PM  [facility] [address] [place], [place], [address] Phone: [phone]   Fax:  [phone]  Name: Sarah [name] MRN: [id number] Date of Birth: [birth year]

## 2016-12-28 NOTE — Patient Instructions (Signed)
LIP and TONGUE exercises  --- DO ALL EXERCISES THREE TIMES EACH DAY  1. Lip press - Press your lips together firmly (like making a /m/ sound) and hold 5 seconds -repeat 10 times  2. LOUD and LONG [place] - make a kissing sound as loud as you can, as long as you can - repeat 10 times  3. Tongue sweep - Sweep your tongue in the pockets of your mouth - touch every tooth  - five revolutions each way  4. PUH! - 10 times       MAKE THE SOUNDS STRONG      TUH - 10 times      KUH! - 10 times  5. Move a licorice stick or a lollipop from side to side in your mouth - 10 back and forth movements  6. Say "OOOOO", and "EEEEE" 10 times (Kiss and smile) - look in a mirror to help  7. Put air in your cheeks, and push on either side 10 times  *KEEP THE AIR IN and DON'T LET IT ESCAPE!!*

## 2016-12-29 ENCOUNTER — Ambulatory Visit: Payer: Medicare Other

## 2016-12-29 ENCOUNTER — Encounter: Payer: Self-pay | Admitting: Cardiology

## 2016-12-29 NOTE — Therapy (Signed)
[facility] [address] [place], [place], [address] Phone: [phone]   Fax:  [phone]  Speech Language Pathology Evaluation  Patient Details  Name: Sarah [name] MRN: [id number] Date of Birth: 01-Mar-1945 Referring Provider: Alysia Penna, MD   Encounter Date: 12/28/2016  End of Session - 12/29/16 1645    Visit Number  1    Number of Visits  17    Date for SLP Re-Evaluation  03/04/17    SLP Start Time  1107    SLP Stop Time   1148    SLP Time Calculation (min)  41 min    Activity Tolerance  Patient tolerated treatment well       Past Medical History:  Diagnosis Date  . Boils   . Glaucoma   . Heart murmur   . HTN (hypertension)   . Hx of adenomatous colonic polyps   . Hyperlipidemia   . Osteoporosis   . Pneumonia   . Stroke ([place])   . Type II or unspecified type diabetes mellitus without mention of complication, uncontrolled     Past Surgical History:  Procedure Laterality Date  . ABDOMINAL HYSTERECTOMY    . CARPAL TUNNEL RELEASE     right  . COLONOSCOPY  12-10-10   per Dr. Deatra Ina, clear, repeat in 7 yrs   . INCISION AND DRAINAGE PERIRECTAL ABSCESS N/A 09/26/2015   Procedure: IRRIGATION AND DEBRIDEMENT PERIRECTAL ABSCESS;  Surgeon: Mickeal Skinner, MD;  Location: [facility];  Service: General;  Laterality: N/A;  . KNEE ARTHROSCOPY     right  . LOOP RECORDER INSERTION N/A 10/19/2016   Procedure: LOOP RECORDER INSERTION;  Surgeon: Thompson Grayer, MD;  Location: [facility];  Service: Cardiovascular;  Laterality: N/A;  . ORIF ANKLE FRACTURE Right 03/24/2012   Procedure: OPEN REDUCTION INTERNAL FIXATION (ORIF) ANKLE FRACTURE;  Surgeon: Newt Minion, MD;  Location: [facility];  Service: Orthopedics;  Laterality: Right;  Open Reduction Internal Fixation Right Bimalleolar ankle fracture  . POLYPECTOMY    . TEE WITHOUT CARDIOVERSION N/A 10/18/2016   Procedure: TRANSESOPHAGEAL ECHOCARDIOGRAM  (TEE);  Surgeon: Fay Records, MD;  [facility];  Service: Cardiovascular;  Laterality: N/A;  . TONSILLECTOMY      There were no vitals filed for this visit.  Subjective Assessment - 12/28/16 1123    Subjective  "I told Dr. Letta Pate my speech doesn't sound like it did."    Currently in Pain?  No/denies         SLP Evaluation [facility] - 12/29/16 0001      SLP Visit Information   SLP Received On  12/28/16    Referring Provider  Alysia Penna, MD    Onset Date  October 2018    Medical Diagnosis  CVA      Subjective   Patient/Family Stated Goal  Improve speech clarity      General Information   HPI  Pt on CIR following CVA on October 14, 2016. Was seeing ST for cognition but no f/u ST recommended due to pt cognitive linguistic skills at baseline up on d/c from CIR. Pt cont'd to have speech disturbance/dysarthria persisting until last physiatrist appt where she inquired of possibility of ST and MD wrote script for ST.       Balance Screen   Has the patient fallen in the past 6 months  No      Prior Functional Status   Cognitive/Linguistic Baseline  Within functional limits  Type of [place]  Retired      Associate Professor   Overall Cognitive Status  Within Functional Limits for tasks assessed      Auditory Comprehension   Overall Auditory Comprehension  Appears within functional limits for tasks assessed      Verbal Expression   Overall Verbal Expression  Appears within functional limits for tasks assessed      Oral Motor/Sensory Function   Overall Oral Motor/Sensory Function  Impaired    Labial ROM  Reduced left    Labial Symmetry  Within Functional Limits    Labial Strength  Reduced Left    Labial Coordination  Reduced    Lingual Symmetry  Abnormal symmetry left mild    Lingual Strength  Reduced Left    Lingual Coordination  Reduced      Motor Speech   Overall Motor Speech  Impaired    Respiration  Impaired    Level  of Impairment  Sentence    Resonance  Hypernasality slight, on plosive-laden sentences    Articulation  Impaired    Level of Impairment  Sentence    Intelligibility  Intelligible    Motor Speech Errors  Consistent                      SLP Education - 12/29/16 1644    Education provided  Yes    Education Details  HEP for dysarthria, therapy goals, results of ST eval    Person(s) Educated  Patient    Methods  Explanation;Demonstration;Handout;Verbal cues    Comprehension  Verbalized understanding;Returned demonstration       SLP Short Term Goals - 12/29/16 1648      SLP SHORT TERM GOAL #1   Title  pt will perform HEP with rare min A over three sessions    Time  4    Period  Weeks    Status  New      SLP SHORT TERM GOAL #2   Title  pt will perform respiratory muscle training exercises with modified independence over two sessions    Time  4    Period  Weeks    Status  New      SLP SHORT TERM GOAL #3   Title  pt will demo compensatory strategies for intelligibility 70% of the time in 10 minutes simple conversation    Time  4    Period  Weeks    Status  New      SLP SHORT TERM GOAL #4   Title  pt will demo abdominal breathing 80% of the time in sentence responses x3 sessions    Time  4    Period  Weeks    Status  New       SLP Long Term Goals - 12/29/16 1649      SLP LONG TERM GOAL #1   Title  pt will use compensatory strategies for intelligibilty with modified independence in 10 minutes mod complex conversation x2 sessions    Time  8    Period  Weeks    Status  New      SLP LONG TERM GOAL #2   Title  pt will demo respiratory muscle training exercises with independence x2 sessions    Time  8    Period  Weeks    Status  New      SLP LONG TERM GOAL #3  Title  pt will perform HEP for dysarthria with modified independence x3 sessions    Time  8    Period  Weeks    Status  New      SLP LONG TERM GOAL #4   Title  pt will demo abdominal breathing  functionally in 10 mintues mod complex conversation     Time  8    Period  Weeks    Status  New       Plan - 12/29/16 1645    Clinical Impression Statement  Pt presents today with mild dysarthria due to CVA in October 2018. Motor speech was not targeted with pt on rehab floor, pt would like to pursue improvement with her speech intelligibility at this time. She would benefit from skilled ST to learn compensatory strategies for speech intelligibiltiy as well as to complete HEP focused on strengthening oral and respiratory musculature.    Speech Therapy Frequency  2x / week    Duration  -- 8 weeks    Treatment/Interventions  Oral motor exercises;SLP instruction and feedback;Environmental controls;Cueing hierarchy;Compensatory strategies;Internal/external aids;Patient/family education    Potential to Achieve Goals  Good    SLP Home Exercise Plan  provided    Consulted and Agree with Plan of Care  Patient       Patient will benefit from skilled therapeutic intervention in order to improve the following deficits and impairments:   Dysarthria and anarthria  G-Codes - 01/13/17 1654    Functional Assessment Tool Used  noms, clinical judgment    Functional Limitations  Motor speech    Motor Speech Current Status [phone])  At least 1 percent but less than 20 percent impaired, limited or restricted    Motor Speech Goal Status (D1497)  At least 1 percent but less than 20 percent impaired, limited or restricted       Problem List Patient Active Problem List   Diagnosis Date Noted  . Hemiparesis affecting left side as late effect of cerebrovascular accident (CVA) ([place]) 11/11/2016  . Gait disturbance, post-stroke 11/11/2016  . Dysarthria as late effect of stroke 11/11/2016  . Carotid artery stenosis 11/02/2016  . Right middle cerebral artery stroke ([place]) 10/19/2016  . Left arm weakness   . Diastolic dysfunction   . Hypertension with heart disease   . Tobacco abuse   . Acute blood loss anemia    . Cerebral infarction due to embolism of right middle cerebral artery ([place])   . Stroke (cerebrum) ([place]) 10/15/2016  . Perirectal abscess 09/26/2015  . Vitamin D deficiency 07/23/2015  . Osteopenia 02/16/2013  . Ankle fracture 01/19/2013  . Anemia 09/27/2011  . Uncontrolled type 2 diabetes mellitus with insulin therapy ([place]) 09/24/2011  . Hyperlipidemia 09/24/2011  . Hypertension 09/24/2011    [facility] ,[place], [place]  12/29/2016, 4:55 PM  [facility] [address] [place], [place], [address] Phone: [phone]   Fax:  [phone]  Name: Sarah [name] MRN: [id number] Date of Birth: 09-19-1945

## 2016-12-30 ENCOUNTER — Encounter: Payer: Self-pay | Admitting: Physical Therapy

## 2016-12-30 ENCOUNTER — Ambulatory Visit: Payer: Medicare Other | Admitting: Occupational Therapy

## 2016-12-30 ENCOUNTER — Ambulatory Visit: Payer: Medicare Other | Admitting: Physical Therapy

## 2016-12-30 ENCOUNTER — Encounter: Payer: Self-pay | Admitting: Occupational Therapy

## 2016-12-30 DIAGNOSIS — R2681 Unsteadiness on feet: Secondary | ICD-10-CM | POA: Diagnosis not present

## 2016-12-30 DIAGNOSIS — I69318 Other symptoms and signs involving cognitive functions following cerebral infarction: Secondary | ICD-10-CM

## 2016-12-30 DIAGNOSIS — R278 Other lack of coordination: Secondary | ICD-10-CM

## 2016-12-30 DIAGNOSIS — R2689 Other abnormalities of gait and mobility: Secondary | ICD-10-CM | POA: Diagnosis not present

## 2016-12-30 DIAGNOSIS — I69354 Hemiplegia and hemiparesis following cerebral infarction affecting left non-dominant side: Secondary | ICD-10-CM | POA: Diagnosis not present

## 2016-12-30 DIAGNOSIS — M6281 Muscle weakness (generalized): Secondary | ICD-10-CM | POA: Diagnosis not present

## 2016-12-30 DIAGNOSIS — R471 Dysarthria and anarthria: Secondary | ICD-10-CM | POA: Diagnosis not present

## 2016-12-30 DIAGNOSIS — R41842 Visuospatial deficit: Secondary | ICD-10-CM

## 2016-12-30 NOTE — Patient Instructions (Signed)
Feet Apart (Compliant Surface) Head Motion - Eyes Closed    Stand on compliant surface (pillow, folded blanket) with feet shoulder width apart. Close eyes and hold for 60 seconds. Repeat 2 times. Then with EYES OPEN move head slowly, up and down  X 10. Then left and right x 10. Do ____ sessions per day.  Copyright  VHI. All rights reserved.

## 2016-12-30 NOTE — Therapy (Signed)
[facility] [address] [place], [place], [address] Phone: [phone]   Fax:  [phone]  Occupational Therapy Treatment  Patient Details  Name: Sarah [name] MRN: [id number] Date of Birth: [birth year] Referring Provider (Historical): Dr. Letta Pate   Encounter Date: 12/30/2016  OT End of Session - 12/30/16 1146    Visit Number  7    Number of Visits  16    Date for OT Re-Evaluation  01/25/17    Authorization Type  [facility] will need PN and G code every 10th visit    Authorization Time Period  60 days    Authorization - Visit Number  7    Authorization - Number of Visits  10    OT Start Time  0846    OT Stop Time  0929    OT Time Calculation (min)  43 min    Activity Tolerance  Patient tolerated treatment well       Past Medical History:  Diagnosis Date  . Boils   . Glaucoma   . Heart murmur   . HTN (hypertension)   . Hx of adenomatous colonic polyps   . Hyperlipidemia   . Osteoporosis   . Pneumonia   . Stroke ([place])   . Type II or unspecified type diabetes mellitus without mention of complication, uncontrolled     Past Surgical History:  Procedure Laterality Date  . ABDOMINAL HYSTERECTOMY    . CARPAL TUNNEL RELEASE     right  . COLONOSCOPY  12-10-10   per Dr. Deatra Ina, clear, repeat in 7 yrs   . INCISION AND DRAINAGE PERIRECTAL ABSCESS N/A 09/26/2015   Procedure: IRRIGATION AND DEBRIDEMENT PERIRECTAL ABSCESS;  Surgeon: Mickeal Skinner, MD;  Location: [facility];  Service: General;  Laterality: N/A;  . KNEE ARTHROSCOPY     right  . LOOP RECORDER INSERTION N/A 10/19/2016   Procedure: LOOP RECORDER INSERTION;  Surgeon: Thompson Grayer, MD;  Location: [facility];  Service: Cardiovascular;  Laterality: N/A;  . ORIF ANKLE FRACTURE Right 03/24/2012   Procedure: OPEN REDUCTION INTERNAL FIXATION (ORIF) ANKLE FRACTURE;  Surgeon: Newt Minion, MD;  Location: [facility];  Service: Orthopedics;   Laterality: Right;  Open Reduction Internal Fixation Right Bimalleolar ankle fracture  . POLYPECTOMY    . TEE WITHOUT CARDIOVERSION N/A 10/18/2016   Procedure: TRANSESOPHAGEAL ECHOCARDIOGRAM (TEE);  Surgeon: Fay Records, MD;  Location: [facility] ENDOSCOPY;  Service: Cardiovascular;  Laterality: N/A;  . TONSILLECTOMY      There were no vitals filed for this visit.  Subjective Assessment - 12/30/16 0802    Subjective   I am pleased with my progress    Pertinent History  Pt with R CVA    Patient Stated Goals  use my hand better - I want to be able to cook    Currently in Pain?  Yes    Pain Score  2     Pain Location  Back    Pain Orientation  Left;Lower    Pain Descriptors / Indicators  Tightness;Sore    Pain Type  Acute pain    Pain Onset  More than a month ago    Pain Frequency  Intermittent    Aggravating Factors   certain  movements that I do - I think it is from my walker    Pain Relieving Factors  avoiding those movements  OT Treatments/Exercises (OP) - 12/30/16 0001      Exercises   Exercises  Hand      Hand Exercises   Hand Gripper with Small Beads  Grippper on #1 to pick up one inch blocks - pt able to do between 4-6 and then requires breaks.  Pt with improved burst strength however sustained grip strength signficantly impaired. Pt also benefits from using vision to look at her left hand when using it.        Fine Motor Coordination   Other Fine Motor Exercises  Addressed in hand manipulation with small objects - pt with improving ability to motor plan and orient object in hand to complete functional tasks - initially needs cues for motor planning and occasional demonstration - with repetition motor patterns, hand orientation and speed improve.       Neurological Re-education Exercises   Other Exercises 1  Neuro re ed to address dynamic standing balance on uneven surfaces with eyes open and eyes closed - pt with signficantly more diffculty with eyes  closed. Discussed with pt places and situations where she would be at greater risk for falling given high dependency on vision for balance.- pt verbalized understanding.               OT Short Term Goals - 12/30/16 1145      OT SHORT TERM GOAL #1   Title  Pt will be mod I with HEP for coordination and grip/pinch strength LUE - 12/28/2016    Status  Achieved      OT SHORT TERM GOAL #2   Title  Pt will demonstrate improved coordination in LUE as evidenced by decreasing time on 9 hole peg test by at least 20 seconds to assist with functional tasks. (baseline= 1.04.47)    Status  Achieved 12/28/2016  2 trials 49.20 and then 30.20      OT SHORT TERM GOAL #3   Title  Pt will be min a for hot meal prep at ambulatory level and demonstrate good safety awareness    Status  Achieved      OT SHORT TERM GOAL #4   Title  Pt will demonstrate improved grip strength LUE by at least 5 pounds to assist with opening jars (baseline = 10 pounds)    Status  Achieved 12/23/2016  30 pounds        OT Long Term Goals - 12/30/16 1145      OT LONG TERM GOAL #1   Title  Pt will be mod I with upgraded HEP - 01/25/2017    Status  On-going      OT LONG TERM GOAL #2   Title  Pt will demonstrate improved grip strength to at least 38 pounds in LUE to assist with home mgmt tasks (baseline = 10 pounds)    Status  On-going 12/23/2016  30 pounds therefore met original goal.   revised to above goal.      OT LONG TERM GOAL #3   Title  Pt will demonstrate improved coordination as evidenced by decreasing time on 9 hole peg test by at least 25 seconds to reduce effort for buttoning, tying    Status  On-going 12/28/2016  2 trials 49.2 and 30.20 will monitor for consistent performance      OT LONG TERM GOAL #4   Title  Pt will demonstrate ability to type simple email with extra time.     Status  On-going      OT LONG  TERM GOAL #5   Title  Pt will be mod I with hot meal prep.    Status  On-going      OT LONG  TERM GOAL #6   Title  Pt will be mod I with home mgmt tasks such as cleaning, laundry    Status  On-going 12/28/2016 pt reports that she is doing parts of laundry with family's help, dishes and some vacuuming      OT LONG TERM GOAL #7   Title  Pt will verbalize understanding of return to driving recommendations.     Status  On-going      OT LONG TERM GOAL #8   Title  Pt will demonstrate improved LUE functional use as evidenced by improving score on Box and Blocks by a least 4 blocks to assist with IADL's (baseline = 30)    Status  On-going            Plan - 12/30/16 1145    Clinical Impression Statement  Pt progressing toward goals - pt is highly motivated to make functional gains with L hand and balance.     Rehab Potential  Good    OT Frequency  2x / week    OT Duration  8 weeks    OT Treatment/Interventions  Self-care/ADL training;Aquatic Therapy;Electrical Stimulation;Moist Heat;Ultrasound;Therapeutic exercise;Neuromuscular education;DME and/or AE instruction;Manual Therapy;Therapist, nutritional;Therapeutic activities;Cognitive remediation/compensation;Visual/perceptual remediation/compensation;Patient/family education;Balance training    Plan  functional mobility, balance, in hand manipulation, grip strength, functional use of L hand, home mgmt tasks, work toward typing    Consulted and Agree with Plan of Care  Patient       Patient will benefit from skilled therapeutic intervention in order to improve the following deficits and impairments:  Abnormal gait, Decreased activity tolerance, Decreased balance, Decreased cognition, Decreased mobility, Decreased knowledge of use of DME, Decreased coordination, Decreased strength, Impaired UE functional use, Impaired vision/preception  Visit Diagnosis: Hemiplegia and hemiparesis following cerebral infarction affecting left non-dominant side (HCC)  Unsteadiness on feet  Other symptoms and signs involving cognitive functions  following cerebral infarction  Muscle weakness (generalized)  Visuospatial deficit  Other lack of coordination    Problem List Patient Active Problem List   Diagnosis Date Noted  . Hemiparesis affecting left side as late effect of cerebrovascular accident (CVA) ([place]) 11/11/2016  . Gait disturbance, post-stroke 11/11/2016  . Dysarthria as late effect of stroke 11/11/2016  . Carotid artery stenosis 11/02/2016  . Right middle cerebral artery stroke ([place]) 10/19/2016  . Left arm weakness   . Diastolic dysfunction   . Hypertension with heart disease   . Tobacco abuse   . Acute blood loss anemia   . Cerebral infarction due to embolism of right middle cerebral artery ([place])   . Stroke (cerebrum) ([place]) 10/15/2016  . Perirectal abscess 09/26/2015  . Vitamin D deficiency 07/23/2015  . Osteopenia 02/16/2013  . Ankle fracture 01/19/2013  . Anemia 09/27/2011  . Uncontrolled type 2 diabetes mellitus with insulin therapy ([place]) 09/24/2011  . Hyperlipidemia 09/24/2011  . Hypertension 09/24/2011    Quay Burow, OTR/L 12/30/2016, 11:48 AM  [facility] [address] [place], [place], [address] Phone: [phone]   Fax:  [phone]  Name: Sarah [name] MRN: [id number] Date of Birth: 1945-07-10

## 2016-12-31 ENCOUNTER — Ambulatory Visit: Payer: Medicare Other

## 2016-12-31 VITALS — BP 136/70

## 2016-12-31 DIAGNOSIS — I119 Hypertensive heart disease without heart failure: Secondary | ICD-10-CM

## 2016-12-31 NOTE — Therapy (Signed)
[facility] [address] [place], [place], [address] Phone: [phone]   Fax:  [phone]  Physical Therapy Treatment  Patient Details  Name: Sarah [name] MRN: [id number] Date of Birth: 06-13-1945 Referring Provider: Alysia Penna MD   Encounter Date: 12/30/2016  PT End of Session - 12/31/16 0532    Visit Number  10    Number of Visits  27 recert 35/46    Date for PT Re-Evaluation  02/28/17    Authorization Type  [employer] Medicare    Authorization Time Period  11/08/16  to  01/07/17; 12/30/16 to 02/28/17    PT Start Time  0930    PT Stop Time  1014    PT Time Calculation (min)  44 min    Activity Tolerance  Patient tolerated treatment well    Behavior During Therapy  [facility] for tasks assessed/performed       Past Medical History:  Diagnosis Date  . Boils   . Glaucoma   . Heart murmur   . HTN (hypertension)   . Hx of adenomatous colonic polyps   . Hyperlipidemia   . Osteoporosis   . Pneumonia   . Stroke ([place])   . Type II or unspecified type diabetes mellitus without mention of complication, uncontrolled     Past Surgical History:  Procedure Laterality Date  . ABDOMINAL HYSTERECTOMY    . CARPAL TUNNEL RELEASE     right  . COLONOSCOPY  12-10-10   per Dr. Deatra Ina, clear, repeat in 7 yrs   . INCISION AND DRAINAGE PERIRECTAL ABSCESS N/A 09/26/2015   Procedure: IRRIGATION AND DEBRIDEMENT PERIRECTAL ABSCESS;  Surgeon: Mickeal Skinner, MD;  Location: [facility];  Service: General;  Laterality: N/A;  . KNEE ARTHROSCOPY     right  . LOOP RECORDER INSERTION N/A 10/19/2016   Procedure: LOOP RECORDER INSERTION;  Surgeon: Thompson Grayer, MD;  Location: [facility];  Service: Cardiovascular;  Laterality: N/A;  . ORIF ANKLE FRACTURE Right 03/24/2012   Procedure: OPEN REDUCTION INTERNAL FIXATION (ORIF) ANKLE FRACTURE;  Surgeon: Newt Minion, MD;  Location: [facility];  Service: Orthopedics;  Laterality: Right;   Open Reduction Internal Fixation Right Bimalleolar ankle fracture  . POLYPECTOMY    . TEE WITHOUT CARDIOVERSION N/A 10/18/2016   Procedure: TRANSESOPHAGEAL ECHOCARDIOGRAM (TEE);  Surgeon: Fay Records, MD;  Location: [facility] ENDOSCOPY;  Service: Cardiovascular;  Laterality: N/A;  . TONSILLECTOMY      There were no vitals filed for this visit.                   [facility] Adult PT Treatment/Exercise - 12/30/16 1700      Ambulation/Gait   Ambulation/Gait Assistance  4: Min guard;4: Min assist    Ambulation/Gait Assistance Details  simulated unlevel surfaces with red mats placed over uneven surface indoors (weather prohibited working outside); pt requied up to min assist to maintain balance walking on this surface; over level ground carring full Counsellor (Feet)  1000 Feet    Assistive device  None    Gait Pattern  Step-through pattern;Decreased stride length;Poor foot clearance - left;Decreased dorsiflexion - left;Decreased arm swing - right;Decreased arm swing - left    Ambulation Surface  Level;Unlevel;Indoor    Stairs Assistance  4: Min assist    Stairs Assistance Details (indicate cue type and reason)  with 1/2 full laundry basket in bil hands, occasional min assist to control balance when descending  Stair Management Technique  No rails;Forwards    Number of Stairs  8    Height of Stairs  6      Instructed in and completed the following exercise to add to HEP:   Feet Apart (Compliant Surface) Head Motion - Eyes Closed    Stand on compliant surface (pillow, folded blanket) with feet shoulder width apart. Close eyes and hold for 60 seconds. Repeat 2 times. Then with EYES OPEN move head slowly, up and down  X 10. Then left and right x 10. Do ____ sessions per day.      PT Education - 12/31/16 0529    Education provided  Yes    Education Details  results of LTG assessment; goals for continuing PT    Person(s) Educated  Patient    Methods   Explanation    Comprehension  Verbalized understanding       PT Short Term Goals - 12/31/16 1750      PT SHORT TERM GOAL #1   Title  Assess gait velocity and set STG and LTG as appropriate. (Target all STGs 12/08/16)    Baseline  11/16/16: met today with PT to set goals    Status  Achieved      PT SHORT TERM GOAL #2   Title  Patient will be independent with HEP for balance, strength, and gait training.     Time  4    Period  Weeks    Status  Achieved      PT SHORT TERM GOAL #3   Title  Patient will ambulate 250 ft on level/unlevel exterior surfaces with LRAD modified independent.     Baseline  12/07/16  250 ft x 2 level indoor surfaces due to weather    Time  4    Period  Weeks    Status  Unable to assess      PT SHORT TERM GOAL #4   Title  Patient will improve TUG to <15 seconds with LRAD.     Baseline  12/07/16 11.06 sec no device    Time  8    Period  Weeks    Status  Achieved      PT SHORT TERM GOAL #5   Title  Patient will complete FGA with goal set as appropriate. (Target date 01/20/17)    Time  3    Period  Weeks    Status  New    Target Date  01/20/17        PT Long Term Goals - 12/30/16 0948      PT LONG TERM GOAL #1   Title  Patient will be independent with updated HEP for balance, strength and ambulation. (Target date all LTGs 01/07/17)    Time  8    Period  Weeks    Status  Achieved      PT LONG TERM GOAL #2   Title  Patient will verbalize her plan for continued community-based activity plan to assist with reducing stroke risk factors.    Baseline  12/07/16 discussed joining gym vs walking at mall or other large stores    Time  8    Period  Weeks    Status  Achieved      PT LONG TERM GOAL #3   Title  Patient will ambulate 1000 ft on outside surfaces with LRAD at modified independent level; 12/07/16 goal updated to no AD    Baseline  Simulated over mats due to weather with minguard assist  Time  8    Period  Weeks    Status  Not Met      PT  LONG TERM GOAL #4   Title  Patient will improve TUG to <13.5 seconds with LRAD.     Baseline  12/07/16 11.06 sec    Time  8    Period  Weeks    Status  Achieved      PT LONG TERM GOAL #5   Title  Patient will ambulate up/down 4 steps with light use of rail and step-over-step sequencing.     Time  4    Period  Weeks    Status  Achieved      Additional Long Term Goals   Additional Long Term Goals  Yes      PT LONG TERM GOAL #6   Title  Patient will ambulate with head turns over level, indoor surface while carrying two simulated bags of groceries of different weights x 200 ft modified independent. (Target date for LTGs 02/10/17)    Time  6    Period  Weeks    Status  New    Target Date  02/10/17      PT LONG TERM GOAL #7   Title  Pateint will ambulate 500 ft over unlevel outdoor surfaces (grass, gravel, curbs, inclines) with no assistive device modified independent. (distance reduced due to winter season)    Time  6    Period  Weeks    Status  New      PT LONG TERM GOAL #8   Title  Patient will carry full laundry basket up/down 12 steps modified independent.     Time  6    Period  Weeks    Status  New      PT LONG TERM GOAL  #9   TITLE  pateint will be independent with updated/final HEP and have a plan for continued community-based fitness program upon discharge from PT.     Time  6    Period  Weeks    Status  New            Plan - 12/31/16 1807    Clinical Impression Statement  Session focused on assessing LTGs with patient meeting 4 of 5 goals and 5th goal unmet. Patient has made excellent progress and continues to have balance issues with higher level tasks (carrying laundry up/down steps, walking over unlevel ground, completing cognitive task while walking). She can continue to benefit from continued PT services to maximize her level of independence with potential to return to living alone. Patient is pleased with her progress and eager to continue PT to maximize her  independence.     Rehab Potential  Good    PT Frequency  2x / week updated for recert 36/62/94    PT Duration  6 weeks    PT Treatment/Interventions  ADLs/Self Care Home Management;DME Instruction;Gait training;Stair training;Functional mobility training;Orthotic Fit/Training;Patient/family education;Neuromuscular re-education;Balance training;Therapeutic exercise;Therapeutic activities;Passive range of motion;Cognitive remediation    PT Next Visit Plan  repeat ambulation tasks while carrying objects, with cognitive challenges, check addition to HEP 12/30/16 and further add to corner ex's as approp    Consulted and Agree with Plan of Care  Patient       Patient will benefit from skilled therapeutic intervention in order to improve the following deficits and impairments:  Decreased balance, Decreased knowledge of use of DME, Decreased mobility, Decreased strength, Difficulty walking, Impaired UE functional use, Impaired vision/preception, Obesity  Visit Diagnosis: Unsteadiness on feet - Plan: PT plan of care cert/re-cert  Muscle weakness (generalized) - Plan: PT plan of care cert/re-cert  Other abnormalities of gait and mobility - Plan: PT plan of care cert/re-cert   G-Codes - 41/93/79 1802    Functional Assessment Tool Used (Outpatient Only)  TUG norrmal 11.06 sec no device; min assist to maintain balance over unlevel surfaces    Functional Limitation  Mobility: Walking and moving around    Mobility: Walking and Moving Around Current Status (K2409)  At least 20 percent but less than 40 percent impaired, limited or restricted    Mobility: Walking and Moving Around Goal Status (B3532)  At least 1 percent but less than 20 percent impaired, limited or restricted       Problem List Patient Active Problem List   Diagnosis Date Noted  . Hemiparesis affecting left side as late effect of cerebrovascular accident (CVA) ([place]) 11/11/2016  . Gait disturbance, post-stroke 11/11/2016  . Dysarthria  as late effect of stroke 11/11/2016  . Carotid artery stenosis 11/02/2016  . Right middle cerebral artery stroke ([place]) 10/19/2016  . Left arm weakness   . Diastolic dysfunction   . Hypertension with heart disease   . Tobacco abuse   . Acute blood loss anemia   . Cerebral infarction due to embolism of right middle cerebral artery ([place])   . Stroke (cerebrum) ([place]) 10/15/2016  . Perirectal abscess 09/26/2015  . Vitamin D deficiency 07/23/2015  . Osteopenia 02/16/2013  . Ankle fracture 01/19/2013  . Anemia 09/27/2011  . Uncontrolled type 2 diabetes mellitus with insulin therapy ([place]) 09/24/2011  . Hyperlipidemia 09/24/2011  . Hypertension 09/24/2011    Rexanne Mano, PT 12/31/2016, 6:15 PM  [facility] [address] [place], [place], [address] Phone: [phone]   Fax:  [phone]  Name: Sarah [name] MRN: [id number] Date of Birth: 03-15-1945

## 2016-12-31 NOTE — Progress Notes (Signed)
Pt in the office today for a blood pressure check. Sat pt in chair with feet planted on floor, back resting on chair back rest. Had pt do some deep breathing exercises to help relax her. Pt states that she is currently fasting and has not took any hypertension medications this morning.  Blood pressure taken on left arm with an adult normal size manual sphygmomanometer. Blood pressure reading reading reported Dr. [name]. MD continent with readings.

## 2017-01-05 ENCOUNTER — Other Ambulatory Visit: Payer: Self-pay | Admitting: Internal Medicine

## 2017-01-06 ENCOUNTER — Encounter: Payer: Self-pay | Admitting: Cardiology

## 2017-01-10 ENCOUNTER — Encounter: Payer: Self-pay | Admitting: Occupational Therapy

## 2017-01-10 ENCOUNTER — Ambulatory Visit: Payer: Medicare Other | Admitting: Occupational Therapy

## 2017-01-10 ENCOUNTER — Other Ambulatory Visit: Payer: Self-pay

## 2017-01-10 ENCOUNTER — Ambulatory Visit: Payer: Medicare Other | Admitting: Speech Pathology

## 2017-01-10 ENCOUNTER — Ambulatory Visit: Payer: Medicare Other | Admitting: Physical Therapy

## 2017-01-10 ENCOUNTER — Encounter: Payer: Self-pay | Admitting: Physical Therapy

## 2017-01-10 DIAGNOSIS — R471 Dysarthria and anarthria: Secondary | ICD-10-CM | POA: Diagnosis not present

## 2017-01-10 DIAGNOSIS — R2681 Unsteadiness on feet: Secondary | ICD-10-CM

## 2017-01-10 DIAGNOSIS — I69318 Other symptoms and signs involving cognitive functions following cerebral infarction: Secondary | ICD-10-CM

## 2017-01-10 DIAGNOSIS — M6281 Muscle weakness (generalized): Secondary | ICD-10-CM

## 2017-01-10 DIAGNOSIS — R2689 Other abnormalities of gait and mobility: Secondary | ICD-10-CM

## 2017-01-10 DIAGNOSIS — R278 Other lack of coordination: Secondary | ICD-10-CM

## 2017-01-10 DIAGNOSIS — I69354 Hemiplegia and hemiparesis following cerebral infarction affecting left non-dominant side: Secondary | ICD-10-CM

## 2017-01-10 DIAGNOSIS — R41842 Visuospatial deficit: Secondary | ICD-10-CM

## 2017-01-10 NOTE — Therapy (Signed)
[facility] [address] [place], [place], [address] Phone: [phone]   Fax:  [phone]  Speech Language Pathology Treatment  Patient Details  Name: Sarah [name] MRN: [id number] Date of Birth: 1945-10-21 Referring Provider: Alysia Penna, MD   Encounter Date: 01/10/2017  End of Session - 01/10/17 1027    Visit Number  2    Number of Visits  17    Date for SLP Re-Evaluation  03/04/17    SLP Start Time  5573    SLP Stop Time   1056    SLP Time Calculation (min)  41 min    Activity Tolerance  Patient tolerated treatment well       Past Medical History:  Diagnosis Date  . Boils   . Glaucoma   . Heart murmur   . HTN (hypertension)   . Hx of adenomatous colonic polyps   . Hyperlipidemia   . Osteoporosis   . Pneumonia   . Stroke ([place])   . Type II or unspecified type diabetes mellitus without mention of complication, uncontrolled     Past Surgical History:  Procedure Laterality Date  . ABDOMINAL HYSTERECTOMY    . CARPAL TUNNEL RELEASE     right  . COLONOSCOPY  12-10-10   per Dr. Deatra Ina, clear, repeat in 7 yrs   . INCISION AND DRAINAGE PERIRECTAL ABSCESS N/A 09/26/2015   Procedure: IRRIGATION AND DEBRIDEMENT PERIRECTAL ABSCESS;  Surgeon: Mickeal Skinner, MD;  Location: [facility];  Service: General;  Laterality: N/A;  . KNEE ARTHROSCOPY     right  . LOOP RECORDER INSERTION N/A 10/19/2016   Procedure: LOOP RECORDER INSERTION;  Surgeon: Thompson Grayer, MD;  Location: [facility];  Service: Cardiovascular;  Laterality: N/A;  . ORIF ANKLE FRACTURE Right 03/24/2012   Procedure: OPEN REDUCTION INTERNAL FIXATION (ORIF) ANKLE FRACTURE;  Surgeon: Newt Minion, MD;  Location: [facility];  Service: Orthopedics;  Laterality: Right;  Open Reduction Internal Fixation Right Bimalleolar ankle fracture  . POLYPECTOMY    . TEE WITHOUT CARDIOVERSION N/A 10/18/2016   Procedure: TRANSESOPHAGEAL ECHOCARDIOGRAM (TEE);   Surgeon: Fay Records, MD;  Location: [facility] ENDOSCOPY;  Service: Cardiovascular;  Laterality: N/A;  . TONSILLECTOMY      There were no vitals filed for this visit.  Subjective Assessment - 01/10/17 1017    Subjective  "I just have this thing that goes on and off. It gets really loud."    Currently in Pain?  No/denies            ADULT SLP TREATMENT - 01/10/17 1018      General Information   Behavior/Cognition  Alert;Cooperative    Patient Positioning  Upright in chair      Treatment Provided   Treatment provided  Cognitive-Linquistic      Pain Assessment   Pain Assessment  No/denies pain      Cognitive-Linquistic Treatment   Treatment focused on  Dysarthria    Skilled Treatment  Pt demo'd HEP for dysarthria with occasional min A for exaggerating movements, air stoppage for consonant sounds. Trained pt in abdominal breathing, with initiation demonstration and tactile cues for relaxed shoulders, fading to occasional min A for 85% accuracy at rest. With addition of phonation pt had throaty, glottal focus with significant fry. Trained pt in resonant voice techniques with nasal consonant sounds for frontal focus. Pt able to identify frontal vs glottal focus with rare min A. Progressed to single nasalized words in concert with abdominal  breathing with occasional min A for coordination of breathing/phonation, reduced neck/shoulder tension.      Assessment / Recommendations / Plan   Plan  Continue with current plan of care      Progression Toward Goals   Progression toward goals  Progressing toward goals       SLP Education - 01/10/17 1203    Education provided  Yes    Education Details  abdominal breathing, frontal voice focus    Person(s) Educated  Patient    Methods  Explanation;Tactile cues;Verbal cues;Handout;Demonstration    Comprehension  Verbalized understanding;Returned demonstration;Need further instruction       SLP Short Term Goals - 01/10/17 1206      SLP SHORT  TERM GOAL #1   Title  pt will perform HEP with rare min A over three sessions    Time  4    Period  Weeks    Status  On-going      SLP SHORT TERM GOAL #2   Title  pt will perform respiratory muscle training exercises with modified independence over two sessions    Time  4    Period  Weeks    Status  On-going      SLP SHORT TERM GOAL #3   Title  pt will demo compensatory strategies for intelligibility 70% of the time in 10 minutes simple conversation    Time  4    Period  Weeks    Status  On-going      SLP SHORT TERM GOAL #4   Title  pt will demo abdominal breathing 80% of the time in sentence responses x3 sessions    Time  4    Period  Weeks    Status  On-going       SLP Long Term Goals - 01/10/17 1206      SLP LONG TERM GOAL #1   Title  pt will use compensatory strategies for intelligibilty with modified independence in 10 minutes mod complex conversation x2 sessions    Time  8    Period  Weeks    Status  On-going      SLP LONG TERM GOAL #2   Title  pt will demo respiratory muscle training exercises with independence x2 sessions    Time  8    Period  Weeks    Status  On-going      SLP LONG TERM GOAL #3   Title  pt will perform HEP for dysarthria with modified independence x3 sessions    Time  8    Period  Weeks    Status  On-going      SLP LONG TERM GOAL #4   Title  pt will demo abdominal breathing functionally in 10 mintues mod complex conversation     Time  8    Period  Weeks    Status  On-going       Plan - 01/10/17 1204    Clinical Impression Statement  Pt presents today with mild dysarthria due to CVA in October 2018. Today pt reports difficulty modulating volume and pitch of her voice; glottal focus noted when phonation attempted with abdominal breathing. She benefitted from resonant voice techniques to improve vocal quality. Continue skilled ST to learn compensatory strategies for speech intelligibiltiy as well as to complete HEP focused on strengthening  oral and respiratory musculature.    Speech Therapy Frequency  2x / week    Treatment/Interventions  Oral motor exercises;SLP instruction and feedback;Environmental controls;Cueing hierarchy;Compensatory strategies;Internal/external  aids;Patient/family education    Potential to Achieve Goals  Good    SLP Home Exercise Plan  provided; added abdominal breathing     Consulted and Agree with Plan of Care  Patient       Patient will benefit from skilled therapeutic intervention in order to improve the following deficits and impairments:   Dysarthria and anarthria    Problem List Patient Active Problem List   Diagnosis Date Noted  . Hemiparesis affecting left side as late effect of cerebrovascular accident (CVA) ([place]) 11/11/2016  . Gait disturbance, post-stroke 11/11/2016  . Dysarthria as late effect of stroke 11/11/2016  . Carotid artery stenosis 11/02/2016  . Right middle cerebral artery stroke ([place]) 10/19/2016  . Left arm weakness   . Diastolic dysfunction   . Hypertension with heart disease   . Tobacco abuse   . Acute blood loss anemia   . Cerebral infarction due to embolism of right middle cerebral artery ([place])   . Stroke (cerebrum) ([place]) 10/15/2016  . Perirectal abscess 09/26/2015  . Vitamin D deficiency 07/23/2015  . Osteopenia 02/16/2013  . Ankle fracture 01/19/2013  . Anemia 09/27/2011  . Uncontrolled type 2 diabetes mellitus with insulin therapy ([place]) 09/24/2011  . Hyperlipidemia 09/24/2011  . Hypertension 09/24/2011   Deneise Lever, [place], [place] Speech-Language Pathologist  Aliene Altes 01/10/2017, 12:07 PM  [facility] [address] [place], [place], [address] Phone: [phone]   Fax:  [phone]   Name: MICHEL ESKELSON MRN: [id number] Date of Birth: [birth year]

## 2017-01-10 NOTE — Therapy (Signed)
[facility] [address] [place], [place], [address] Phone: [phone]   Fax:  [phone]  Occupational Therapy Treatment  Patient Details  Name: Sarah [name] MRN: [id number] Date of Birth: 11/17/1945 Referring Provider (Historical): Dr. Letta Pate   Encounter Date: 01/10/2017  OT End of Session - 01/10/17 1211    Visit Number  8    Number of Visits  16    Date for OT Re-Evaluation  01/25/17    Authorization Type  [facility] will need PN and G code every 10th visit    Authorization Time Period  60 days    Authorization - Visit Number  8    Authorization - Number of Visits  10    OT Start Time  0931    OT Stop Time  1014    OT Time Calculation (min)  43 min    Activity Tolerance  Patient tolerated treatment well       Past Medical History:  Diagnosis Date  . Boils   . Glaucoma   . Heart murmur   . HTN (hypertension)   . Hx of adenomatous colonic polyps   . Hyperlipidemia   . Osteoporosis   . Pneumonia   . Stroke ([place])   . Type II or unspecified type diabetes mellitus without mention of complication, uncontrolled     Past Surgical History:  Procedure Laterality Date  . ABDOMINAL HYSTERECTOMY    . CARPAL TUNNEL RELEASE     right  . COLONOSCOPY  12-10-10   per Dr. Deatra Ina, clear, repeat in 7 yrs   . INCISION AND DRAINAGE PERIRECTAL ABSCESS N/A 09/26/2015   Procedure: IRRIGATION AND DEBRIDEMENT PERIRECTAL ABSCESS;  Surgeon: Mickeal Skinner, MD;  Location: [facility];  Service: General;  Laterality: N/A;  . KNEE ARTHROSCOPY     right  . LOOP RECORDER INSERTION N/A 10/19/2016   Procedure: LOOP RECORDER INSERTION;  Surgeon: Thompson Grayer, MD;  Location: [facility];  Service: Cardiovascular;  Laterality: N/A;  . ORIF ANKLE FRACTURE Right 03/24/2012   Procedure: OPEN REDUCTION INTERNAL FIXATION (ORIF) ANKLE FRACTURE;  Surgeon: Newt Minion, MD;  Location: [facility];  Service: Orthopedics;   Laterality: Right;  Open Reduction Internal Fixation Right Bimalleolar ankle fracture  . POLYPECTOMY    . TEE WITHOUT CARDIOVERSION N/A 10/18/2016   Procedure: TRANSESOPHAGEAL ECHOCARDIOGRAM (TEE);  Surgeon: Fay Records, MD;  Location: [facility];  Service: Cardiovascular;  Laterality: N/A;  . TONSILLECTOMY      There were no vitals filed for this visit.  Subjective Assessment - 01/10/17 0934    Subjective   It feels great to be able to move around without my walker or cane!    Pertinent History  Pt with R CVA    Patient Stated Goals  use my hand better - I want to be able to cook    Currently in Pain?  No/denies                   OT Treatments/Exercises (OP) - 01/10/17 0001      ADLs   ADL Comments  Addressed simple single letter typing with L hand today - pt with improved control to use 1 finger at time to do ASDF on key board.       Hand Exercises   Other Hand Exercises  Gripper on #1 to address both burst and sustained grip strength picking up one inch blocks and placing on higher surface. Pt today  able to do 14 blocks before needing rest break (only able to do 4 last week). Pt completed task with only 2 rest breaks.        Neurological Re-education Exercises   Other Exercises 1  Neuro re ed to address in hand manipulation, motor planning, hand orientation for functional task as well as manipulating two small items at once. With increased difficulty pt demonstrates more abnormal movement patterns - requires increased time, demonstration and doing tasks with R hand first prior to using L hand.            Balance Exercises - 01/10/17 1118      Balance Exercises: Standing   Standing Eyes Opened  Narrow base of support (BOS);Wide (BOA);Head turns;Foam/compliant surface;Other reps (comment);Limitations    Standing Eyes Closed  Wide (BOA);Narrow base of support (BOS);Head turns;Foam/compliant surface;Other reps (comment);30 secs;Limitations          OT Short  Term Goals - 01/10/17 1206      OT SHORT TERM GOAL #1   Title  Pt will be mod I with HEP for coordination and grip/pinch strength LUE - 12/28/2016    Status  Achieved      OT SHORT TERM GOAL #2   Title  Pt will demonstrate improved coordination in LUE as evidenced by decreasing time on 9 hole peg test by at least 20 seconds to assist with functional tasks. (baseline= 1.04.47)    Status  Achieved 12/28/2016  2 trials 49.20 and then 30.20      OT SHORT TERM GOAL #3   Title  Pt will be min a for hot meal prep at ambulatory level and demonstrate good safety awareness    Status  Achieved      OT SHORT TERM GOAL #4   Title  Pt will demonstrate improved grip strength LUE by at least 5 pounds to assist with opening jars (baseline = 10 pounds)    Status  Achieved 12/23/2016  30 pounds        OT Long Term Goals - 01/10/17 1206      OT LONG TERM GOAL #1   Title  Pt will be mod I with upgraded HEP - 01/25/2017    Status  On-going      OT LONG TERM GOAL #2   Title  Pt will demonstrate improved grip strength to at least 38 pounds in LUE to assist with home mgmt tasks (baseline = 10 pounds)    Status  On-going 12/23/2016  30 pounds therefore met original goal.   revised to above goal.      OT LONG TERM GOAL #3   Title  Pt will demonstrate improved coordination as evidenced by decreasing time on 9 hole peg test by at least 25 seconds to reduce effort for buttoning, tying    Status  On-going 12/28/2016  2 trials 49.2 and 30.20 will monitor for consistent performance      OT LONG TERM GOAL #4   Title  Pt will demonstrate ability to type simple email with extra time.     Status  On-going      OT LONG TERM GOAL #5   Title  Pt will be mod I with hot meal prep.    Status  On-going      OT LONG TERM GOAL #6   Title  Pt will be mod I with home mgmt tasks such as cleaning, laundry    Status  On-going 12/28/2016 pt reports that she is doing parts  of laundry with family's help, dishes and some  vacuuming      OT LONG TERM GOAL #7   Title  Pt will verbalize understanding of return to driving recommendations.     Status  On-going      OT LONG TERM GOAL #8   Title  Pt will demonstrate improved LUE functional use as evidenced by improving score on Box and Blocks by a least 4 blocks to assist with IADL's (baseline = 30)    Status  On-going            Plan - 01/10/17 1210    Clinical Impression Statement  Pt contiunes to demonstrate progress and improved functional use of R hand. Pt benefits from repetition due to significant motor planning deficits.     Rehab Potential  Good    OT Frequency  2x / week    OT Treatment/Interventions  Self-care/ADL training;Aquatic Therapy;Electrical Stimulation;Moist Heat;Ultrasound;Therapeutic exercise;Neuromuscular education;DME and/or AE instruction;Manual Therapy;Therapist, nutritional;Therapeutic activities;Cognitive remediation/compensation;Visual/perceptual remediation/compensation;Patient/family education;Balance training    Plan  functional mobility, balance, in hand manipulation, grip strength, functional use of L hand, home mgmt tasks, work toward typing    Consulted and Agree with Plan of Care  Patient       Patient will benefit from skilled therapeutic intervention in order to improve the following deficits and impairments:  Abnormal gait, Decreased activity tolerance, Decreased balance, Decreased cognition, Decreased mobility, Decreased knowledge of use of DME, Decreased coordination, Decreased strength, Impaired UE functional use, Impaired vision/preception  Visit Diagnosis: Hemiplegia and hemiparesis following cerebral infarction affecting left non-dominant side (HCC)  Unsteadiness on feet  Other symptoms and signs involving cognitive functions following cerebral infarction  Muscle weakness (generalized)  Visuospatial deficit  Other lack of coordination    Problem List Patient Active Problem List   Diagnosis Date  Noted  . Hemiparesis affecting left side as late effect of cerebrovascular accident (CVA) ([facility]) 11/11/2016  . Gait disturbance, post-stroke 11/11/2016  . Dysarthria as late effect of stroke 11/11/2016  . Carotid artery stenosis 11/02/2016  . Right middle cerebral artery stroke ([facility]) 10/19/2016  . Left arm weakness   . Diastolic dysfunction   . Hypertension with heart disease   . Tobacco abuse   . Acute blood loss anemia   . Cerebral infarction due to embolism of right middle cerebral artery ([facility])   . Stroke (cerebrum) ([facility]) 10/15/2016  . Perirectal abscess 09/26/2015  . Vitamin D deficiency 07/23/2015  . Osteopenia 02/16/2013  . Ankle fracture 01/19/2013  . Anemia 09/27/2011  . Uncontrolled type 2 diabetes mellitus with insulin therapy ([place]) 09/24/2011  . Hyperlipidemia 09/24/2011  . Hypertension 09/24/2011    Quay Burow, OTR/L 01/10/2017, 12:13 PM  [facility] [address] [place], [place], [address] Phone: [phone]   Fax:  [phone]  Name: CLAIR BARDWELL MRN: [id number] Date of Birth: 11/07/1945

## 2017-01-10 NOTE — Therapy (Signed)
[facility] [address] [place], [place], [address] Phone: [phone]   Fax:  [phone]  Physical Therapy Treatment  Patient Details  Name: Sarah [name] MRN: [id number] Date of Birth: [birth year] Referring Provider: Alysia Penna MD   Encounter Date: 01/10/2017  PT End of Session - 01/10/17 1107    Visit Number  11 next G-code visit 20    Number of Visits  27 recert 66/06    Date for PT Re-Evaluation  02/28/17    Authorization Type  [employer] Medicare    Authorization Time Period  11/08/16  to  01/07/17; 12/30/16 to 02/28/17    PT Start Time  1102    PT Stop Time  1141    PT Time Calculation (min)  39 min    Activity Tolerance  Patient tolerated treatment well    Behavior During Therapy  [facility] for tasks assessed/performed       Past Medical History:  Diagnosis Date  . Boils   . Glaucoma   . Heart murmur   . HTN (hypertension)   . Hx of adenomatous colonic polyps   . Hyperlipidemia   . Osteoporosis   . Pneumonia   . Stroke ([facility])   . Type II or unspecified type diabetes mellitus without mention of complication, uncontrolled     Past Surgical History:  Procedure Laterality Date  . ABDOMINAL HYSTERECTOMY    . CARPAL TUNNEL RELEASE     right  . COLONOSCOPY  12-10-10   per Dr. Deatra Ina, clear, repeat in 7 yrs   . INCISION AND DRAINAGE PERIRECTAL ABSCESS N/A 09/26/2015   Procedure: IRRIGATION AND DEBRIDEMENT PERIRECTAL ABSCESS;  Surgeon: Mickeal Skinner, MD;  Location: [facility];  Service: General;  Laterality: N/A;  . KNEE ARTHROSCOPY     right  . LOOP RECORDER INSERTION N/A 10/19/2016   Procedure: LOOP RECORDER INSERTION;  Surgeon: Thompson Grayer, MD;  Location: [facility];  Service: Cardiovascular;  Laterality: N/A;  . ORIF ANKLE FRACTURE Right 03/24/2012   Procedure: OPEN REDUCTION INTERNAL FIXATION (ORIF) ANKLE FRACTURE;  Surgeon: Newt Minion, MD;  Location: [facility];  Service: Orthopedics;   Laterality: Right;  Open Reduction Internal Fixation Right Bimalleolar ankle fracture  . POLYPECTOMY    . TEE WITHOUT CARDIOVERSION N/A 10/18/2016   Procedure: TRANSESOPHAGEAL ECHOCARDIOGRAM (TEE);  Surgeon: Fay Records, MD;  Location: [facility] ENDOSCOPY;  Service: Cardiovascular;  Laterality: N/A;  . TONSILLECTOMY      There were no vitals filed for this visit.  Subjective Assessment - 01/10/17 1106    Subjective  No new complaints. No falls or pain to report.     Pertinent History  HTN, HLD, DM, neurop, tobacco, COPD, OP (lives w/ mom; sister came to stay)    Limitations  Standing;Walking    How long can you stand comfortably?  15 min    Patient Stated Goals  to improve the use of her left hand and walk without a walker    Currently in Pain?  No/denies    Pain Score  0-No pain           [facility] Adult PT Treatment/Exercise - 01/10/17 1125      Ambulation/Gait   Ambulation/Gait  Yes    Ambulation/Gait Assistance  4: Min guard    Ambulation/Gait Assistance Details  carrying half loaded laundry basket and weaving around furniture/obstacles    Ambulation Distance (Feet)  -- around gym    Assistive device  None  Gait Pattern  Step-through pattern;Decreased stride length;Poor foot clearance - left;Decreased dorsiflexion - left;Decreased arm swing - right;Decreased arm swing - left    Ambulation Surface  Level;Indoor    Stairs  Yes    Stairs Assistance  4: Min assist    Stairs Assistance Details (indicate cue type and reason)  while carrying half loaded laundry basket, cues needed on safety and technique. min assist for balance with descending, min guard assist to ascend stairs.     Stair Management Technique  No rails;Forwards    Number of Stairs  4 x2 reps    Height of Stairs  6      High Level Balance   High Level Balance Activities  Tandem walking;Head turns    High Level Balance Comments  on both red mats: fwd/bwd gait with head movements left/right, then up/down x 3 laps each  with min assist for balance; tandem gait fwd/bwc x 3 laps with min guard assist for balance.       Neuro Re-ed    Neuro Re-ed Details   gait around track while tossing hankerchief and naming foods A-Z, min guard to min assist for balance with cues to task and assistance needed with food items `60%.           Balance Exercises - 01/10/17 1118      Balance Exercises: Standing   Standing Eyes Opened  Narrow base of support (BOS);Wide (BOA);Head turns;Foam/compliant surface;Other reps (comment);Limitations    Standing Eyes Closed  Wide (BOA);Narrow base of support (BOS);Head turns;Foam/compliant surface;Other reps (comment);30 secs;Limitations      Balance Exercises: Standing   Standing Eyes Opened Limitations  on pillows in corner: wide base head movements with EO, then with narrow base of support EO. progressed to EC narrow base (see below).     Standing Eyes Closed Limitations  in corner on pillows with chair in front for safety: wide base of support EC for 30 sec's x 2, then narrow base of support EC for 30 sec's x 3 with min guard assist. Performed head movements with EO with no significant challenge noted, therefore progressed to wide base of support with EC for head movements left<>right, then up<>down x 10 each, followed by narrow base of support for EC with same head movements/reps. min guard to min assist with balance with occasional touch to wall/chair for balance.                                       PT Short Term Goals - 12/31/16 1750      PT SHORT TERM GOAL #1   Title  Assess gait velocity and set STG and LTG as appropriate. (Target all STGs 12/08/16)    Baseline  11/16/16: met today with PT to set goals    Status  Achieved      PT SHORT TERM GOAL #2   Title  Patient will be independent with HEP for balance, strength, and gait training.     Time  4    Period  Weeks    Status  Achieved      PT SHORT TERM GOAL #3   Title  Patient will ambulate 250 ft on level/unlevel  exterior surfaces with LRAD modified independent.     Baseline  12/07/16  250 ft x 2 level indoor surfaces due to weather    Time  4    Period  Weeks    Status  Unable to assess      PT SHORT TERM GOAL #4   Title  Patient will improve TUG to <15 seconds with LRAD.     Baseline  12/07/16 11.06 sec no device    Time  8    Period  Weeks    Status  Achieved      PT SHORT TERM GOAL #5   Title  Patient will complete FGA with goal set as appropriate. (Target date 01/20/17)    Time  3    Period  Weeks    Status  New    Target Date  01/20/17        PT Long Term Goals - 12/30/16 0948      PT LONG TERM GOAL #1   Title  Patient will be independent with updated HEP for balance, strength and ambulation. (Target date all LTGs 01/07/17)    Time  8    Period  Weeks    Status  Achieved      PT LONG TERM GOAL #2   Title  Patient will verbalize her plan for continued community-based activity plan to assist with reducing stroke risk factors.    Baseline  12/07/16 discussed joining gym vs walking at mall or other large stores    Time  8    Period  Weeks    Status  Achieved      PT LONG TERM GOAL #3   Title  Patient will ambulate 1000 ft on outside surfaces with LRAD at modified independent level; 12/07/16 goal updated to no AD    Baseline  Simulated over mats due to weather with minguard assist    Time  8    Period  Weeks    Status  Not Met      PT LONG TERM GOAL #4   Title  Patient will improve TUG to <13.5 seconds with LRAD.     Baseline  12/07/16 11.06 sec    Time  8    Period  Weeks    Status  Achieved      PT LONG TERM GOAL #5   Title  Patient will ambulate up/down 4 steps with light use of rail and step-over-step sequencing.     Time  4    Period  Weeks    Status  Achieved      Additional Long Term Goals   Additional Long Term Goals  Yes      PT LONG TERM GOAL #6   Title  Patient will ambulate with head turns over level, indoor surface while carrying two simulated bags  of groceries of different weights x 200 ft modified independent. (Target date for LTGs 02/10/17)    Time  6    Period  Weeks    Status  New    Target Date  02/10/17      PT LONG TERM GOAL #7   Title  Pateint will ambulate 500 ft over unlevel outdoor surfaces (grass, gravel, curbs, inclines) with no assistive device modified independent. (distance reduced due to winter season)    Time  6    Period  Weeks    Status  New      PT LONG TERM GOAL #8   Title  Patient will carry full laundry basket up/down 12 steps modified independent.     Time  6    Period  Weeks    Status  New      PT LONG  TERM GOAL  #9   TITLE  pateint will be independent with updated/final HEP and have a plan for continued community-based fitness program upon discharge from PT.     Time  6    Period  Weeks    Status  New         Plan - 01/10/17 1107    Clinical Impression Statement  Today's skilled session focused on high level balance and multi-tasking with cognitive challanges with no significant issues noted. Pt continues to be challanged on complaint surface and with vision removed. Pt is making steady progress toward goals and should benefit from continued PT to progress toward unmet goals.     Rehab Potential  Good    PT Frequency  2x / week updated for recert 20/35/59    PT Duration  6 weeks    PT Treatment/Interventions  ADLs/Self Care Home Management;DME Instruction;Gait training;Stair training;Functional mobility training;Orthotic Fit/Training;Patient/family education;Neuromuscular re-education;Balance training;Therapeutic exercise;Therapeutic activities;Passive range of motion;Cognitive remediation    PT Next Visit Plan  continue to work on high level balance with emphasis on complaint surfaces/vision removed, and add to corner HEP as needed    Consulted and Agree with Plan of Care  Patient       Patient will benefit from skilled therapeutic intervention in order to improve the following deficits and  impairments:  Decreased balance, Decreased knowledge of use of DME, Decreased mobility, Decreased strength, Difficulty walking, Impaired UE functional use, Impaired vision/preception, Obesity  Visit Diagnosis: Unsteadiness on feet  Muscle weakness (generalized)  Other abnormalities of gait and mobility     Problem List Patient Active Problem List   Diagnosis Date Noted  . Hemiparesis affecting left side as late effect of cerebrovascular accident (CVA) ([place]) 11/11/2016  . Gait disturbance, post-stroke 11/11/2016  . Dysarthria as late effect of stroke 11/11/2016  . Carotid artery stenosis 11/02/2016  . Right middle cerebral artery stroke ([facility]) 10/19/2016  . Left arm weakness   . Diastolic dysfunction   . Hypertension with heart disease   . Tobacco abuse   . Acute blood loss anemia   . Cerebral infarction due to embolism of right middle cerebral artery ([place])   . Stroke (cerebrum) ([place]) 10/15/2016  . Perirectal abscess 09/26/2015  . Vitamin D deficiency 07/23/2015  . Osteopenia 02/16/2013  . Ankle fracture 01/19/2013  . Anemia 09/27/2011  . Uncontrolled type 2 diabetes mellitus with insulin therapy ([place]) 09/24/2011  . Hyperlipidemia 09/24/2011  . Hypertension 09/24/2011    Willow Ora, PTA, [facility] [address] [place], [place] [address] [phone] 01/10/17, 4:29 PM  Name: Sarah [name] MRN: [id number] Date of Birth: [birth year]

## 2017-01-10 NOTE — Patient Instructions (Addendum)
Abdominal Breathing exercises   . Shoulders down - this is a cue to relax . Place your hand on your abdomen - this helps you focus on easy abdominal breath support - the best and most relaxed way to breathe . Breathe in through your nose and fill your belly with air, watching your hand move outward . Breathe out through your mouth and watch your belly move in. An audible hiss "sssss" may help   Think of your belly as a balloon, when you fill with air (inhale), the balloon gets bigger. As the air goes out (exhale), the balloon deflates.  If you are having difficulty coordinating this, lay on your back with a plastic cup on your belly and repeat the above steps, watching you belly move up with inhalation and down with exhalations  Practice breathing in and out in front of a mirror, watching your belly Breathe in for a count of 5 and breathe out for a count of 5

## 2017-01-12 ENCOUNTER — Other Ambulatory Visit: Payer: Self-pay | Admitting: Family Medicine

## 2017-01-12 ENCOUNTER — Telehealth: Payer: Self-pay | Admitting: Internal Medicine

## 2017-01-12 NOTE — Telephone Encounter (Signed)
New Message  Pt call requesting got speak with RN to send a transmission.

## 2017-01-13 ENCOUNTER — Ambulatory Visit: Payer: Medicare Other | Attending: Physical Medicine & Rehabilitation

## 2017-01-13 ENCOUNTER — Encounter: Payer: Self-pay | Admitting: Physical Therapy

## 2017-01-13 ENCOUNTER — Other Ambulatory Visit: Payer: Self-pay

## 2017-01-13 ENCOUNTER — Ambulatory Visit: Payer: Medicare Other | Admitting: Physical Therapy

## 2017-01-13 DIAGNOSIS — R2689 Other abnormalities of gait and mobility: Secondary | ICD-10-CM

## 2017-01-13 DIAGNOSIS — I69318 Other symptoms and signs involving cognitive functions following cerebral infarction: Secondary | ICD-10-CM | POA: Diagnosis not present

## 2017-01-13 DIAGNOSIS — R2681 Unsteadiness on feet: Secondary | ICD-10-CM | POA: Diagnosis not present

## 2017-01-13 DIAGNOSIS — R278 Other lack of coordination: Secondary | ICD-10-CM | POA: Diagnosis not present

## 2017-01-13 DIAGNOSIS — M6281 Muscle weakness (generalized): Secondary | ICD-10-CM | POA: Diagnosis not present

## 2017-01-13 DIAGNOSIS — R41842 Visuospatial deficit: Secondary | ICD-10-CM | POA: Insufficient documentation

## 2017-01-13 DIAGNOSIS — I69354 Hemiplegia and hemiparesis following cerebral infarction affecting left non-dominant side: Secondary | ICD-10-CM | POA: Insufficient documentation

## 2017-01-13 DIAGNOSIS — R471 Dysarthria and anarthria: Secondary | ICD-10-CM | POA: Insufficient documentation

## 2017-01-13 NOTE — Therapy (Signed)
[facility] [address] [place], [place], [address] Phone: [phone]   Fax:  [phone]  Speech Language Pathology Treatment  Patient Details  Name: Sarah [name] MRN: [id number] Date of Birth: [birth year] Referring Provider: Alysia Penna, MD   Encounter Date: 01/13/2017  End of Session - 01/13/17 1233    Visit Number  3    Number of Visits  17    Date for SLP Re-Evaluation  03/04/17    SLP Start Time  0804    SLP Stop Time   0846    SLP Time Calculation (min)  42 min    Activity Tolerance  Patient tolerated treatment well       Past Medical History:  Diagnosis Date  . Boils   . Glaucoma   . Heart murmur   . HTN (hypertension)   . Hx of adenomatous colonic polyps   . Hyperlipidemia   . Osteoporosis   . Pneumonia   . Stroke ([place])   . Type II or unspecified type diabetes mellitus without mention of complication, uncontrolled     Past Surgical History:  Procedure Laterality Date  . ABDOMINAL HYSTERECTOMY    . CARPAL TUNNEL RELEASE     right  . COLONOSCOPY  12-10-10   per Dr. Deatra Ina, clear, repeat in 7 yrs   . INCISION AND DRAINAGE PERIRECTAL ABSCESS N/A 09/26/2015   Procedure: IRRIGATION AND DEBRIDEMENT PERIRECTAL ABSCESS;  Surgeon: Mickeal Skinner, MD;  Location: [facility];  Service: General;  Laterality: N/A;  . KNEE ARTHROSCOPY     right  . LOOP RECORDER INSERTION N/A 10/19/2016   Procedure: LOOP RECORDER INSERTION;  Surgeon: Thompson Grayer, MD;  Location: [facility];  Service: Cardiovascular;  Laterality: N/A;  . ORIF ANKLE FRACTURE Right 03/24/2012   Procedure: OPEN REDUCTION INTERNAL FIXATION (ORIF) ANKLE FRACTURE;  Surgeon: Newt Minion, MD;  Location: [facility];  Service: Orthopedics;  Laterality: Right;  Open Reduction Internal Fixation Right Bimalleolar ankle fracture  . POLYPECTOMY    . TEE WITHOUT CARDIOVERSION N/A 10/18/2016   Procedure: TRANSESOPHAGEAL ECHOCARDIOGRAM (TEE);   Surgeon: Fay Records, MD;  Location: [facility];  Service: Cardiovascular;  Laterality: N/A;  . TONSILLECTOMY      There were no vitals filed for this visit.  Subjective Assessment - 01/13/17 0815    Subjective  "I've been practicing about 5 minutes per day (abdominal breathing)."    Currently in Pain?  No/denies            ADULT SLP TREATMENT - 01/13/17 0824      General Information   Behavior/Cognition  Alert;Cooperative;Pleasant mood      Treatment Provided   Treatment provided  Cognitive-Linquistic      Cognitive-Linquistic Treatment   Treatment focused on  Dysarthria    Skilled Treatment  SLP observed pt practicing abdominal breathing (AB) with approx 65% success at rest, incr'd to 85% with min cues rarely. Pt was independent within 2 minutes with approx 90% success. SLP then worked with pt overarticulation to compensate for dysarthria. In reading task, pt was 90% successful with WNL artic, artic was certainly functional. Pt complained of rare anomia so SLP provided homework with synonyms/antonyms.       Assessment / Recommendations / Plan   Plan  Continue with current plan of care      Progression Toward Goals   Progression toward goals  Progressing toward goals         SLP Short  Term Goals - 01/13/17 1235      SLP SHORT TERM GOAL #1   Title  pt will perform HEP with rare min A over three sessions    Time  4    Period  Weeks    Status  On-going      SLP SHORT TERM GOAL #2   Title  pt will perform respiratory muscle training exercises with modified independence over two sessions    Time  4    Period  Weeks    Status  On-going      SLP SHORT TERM GOAL #3   Title  pt will demo compensatory strategies for intelligibility 70% of the time in 10 minutes simple conversation    Time  4    Period  Weeks    Status  On-going      SLP SHORT TERM GOAL #4   Title  pt will demo abdominal breathing 80% of the time in sentence responses x3 sessions    Time  4     Period  Weeks    Status  On-going       SLP Long Term Goals - 01/10/17 1206      SLP LONG TERM GOAL #1   Title  pt will use compensatory strategies for intelligibilty with modified independence in 10 minutes mod complex conversation x2 sessions    Time  8    Period  Weeks    Status  On-going      SLP LONG TERM GOAL #2   Title  pt will demo respiratory muscle training exercises with independence x2 sessions    Time  8    Period  Weeks    Status  On-going      SLP LONG TERM GOAL #3   Title  pt will perform HEP for dysarthria with modified independence x3 sessions    Time  8    Period  Weeks    Status  On-going      SLP LONG TERM GOAL #4   Title  pt will demo abdominal breathing functionally in 10 mintues mod complex conversation     Time  8    Period  Weeks    Status  On-going       Plan - 01/13/17 1233    Clinical Impression Statement  Pt presents today with mild dysarthria due to CVA in October 2018. See "skilled intervention" for more details. Today pt reports difficulty rarely with anomia, SLP provided homework for word finding. Continue skilled ST to learn compensatory strategies for speech intelligibiltiy as well as to complete HEP focused on strengthening oral and respiratory musculature.    Speech Therapy Frequency  2x / week    Treatment/Interventions  Oral motor exercises;SLP instruction and feedback;Environmental controls;Cueing hierarchy;Compensatory strategies;Internal/external aids;Patient/family education    Potential to Achieve Goals  Good    SLP Home Exercise Plan  provided; added abdominal breathing     Consulted and Agree with Plan of Care  Patient       Patient will benefit from skilled therapeutic intervention in order to improve the following deficits and impairments:   Dysarthria and anarthria    Problem List Patient Active Problem List   Diagnosis Date Noted  . Hemiparesis affecting left side as late effect of cerebrovascular accident (CVA)  ([place]) 11/11/2016  . Gait disturbance, post-stroke 11/11/2016  . Dysarthria as late effect of stroke 11/11/2016  . Carotid artery stenosis 11/02/2016  . Right middle cerebral artery stroke ([place]) 10/19/2016  .  Left arm weakness   . Diastolic dysfunction   . Hypertension with heart disease   . Tobacco abuse   . Acute blood loss anemia   . Cerebral infarction due to embolism of right middle cerebral artery ([place])   . Stroke (cerebrum) ([place]) 10/15/2016  . Perirectal abscess 09/26/2015  . Vitamin D deficiency 07/23/2015  . Osteopenia 02/16/2013  . Ankle fracture 01/19/2013  . Anemia 09/27/2011  . Uncontrolled type 2 diabetes mellitus with insulin therapy ([place]) 09/24/2011  . Hyperlipidemia 09/24/2011  . Hypertension 09/24/2011    [facility] ,[place], [place]  01/13/2017, 12:35 PM  [facility] [address] [place], [place], [address] Phone: [phone]   Fax:  [phone]   Name: Sarah [name] MRN: [id number] Date of Birth: 1945/10/07

## 2017-01-13 NOTE — Patient Instructions (Signed)
Continue to practice abdominal breathing - 10 minutes twice a day

## 2017-01-13 NOTE — Therapy (Signed)
[facility] [address] [place], [place], [address] Phone: [phone]   Fax:  [phone]  Physical Therapy Treatment  Patient Details  Name: Sarah [name] MRN: [id number] Date of Birth: Mar 06, 1945 Referring Provider: Alysia Penna MD   Encounter Date: 01/13/2017  PT End of Session - 01/13/17 0850    Visit Number  12 next G-code visit 20    Number of Visits  27 recert 29/19    Date for PT Re-Evaluation  02/28/17    Authorization Type  [employer] Medicare    Authorization Time Period  11/08/16  to  01/07/17; 12/30/16 to 02/28/17    PT Start Time  0848    PT Stop Time  0930    PT Time Calculation (min)  42 min    Equipment Utilized During Treatment  Gait belt    Activity Tolerance  Patient tolerated treatment well    Behavior During Therapy  [facility] for tasks assessed/performed       Past Medical History:  Diagnosis Date  . Boils   . Glaucoma   . Heart murmur   . HTN (hypertension)   . Hx of adenomatous colonic polyps   . Hyperlipidemia   . Osteoporosis   . Pneumonia   . Stroke ([place])   . Type II or unspecified type diabetes mellitus without mention of complication, uncontrolled     Past Surgical History:  Procedure Laterality Date  . ABDOMINAL HYSTERECTOMY    . CARPAL TUNNEL RELEASE     right  . COLONOSCOPY  12-10-10   per Dr. Deatra Ina, clear, repeat in 7 yrs   . INCISION AND DRAINAGE PERIRECTAL ABSCESS N/A 09/26/2015   Procedure: IRRIGATION AND DEBRIDEMENT PERIRECTAL ABSCESS;  Surgeon: Mickeal Skinner, MD;  Location: [facility];  Service: General;  Laterality: N/A;  . KNEE ARTHROSCOPY     right  . LOOP RECORDER INSERTION N/A 10/19/2016   Procedure: LOOP RECORDER INSERTION;  Surgeon: Thompson Grayer, MD;  Location: [facility];  Service: Cardiovascular;  Laterality: N/A;  . ORIF ANKLE FRACTURE Right 03/24/2012   Procedure: OPEN REDUCTION INTERNAL FIXATION (ORIF) ANKLE FRACTURE;  Surgeon: Newt Minion,  MD;  Location: [facility];  Service: Orthopedics;  Laterality: Right;  Open Reduction Internal Fixation Right Bimalleolar ankle fracture  . POLYPECTOMY    . TEE WITHOUT CARDIOVERSION N/A 10/18/2016   Procedure: TRANSESOPHAGEAL ECHOCARDIOGRAM (TEE);  Surgeon: Fay Records, MD;  Location: [facility] ENDOSCOPY;  Service: Cardiovascular;  Laterality: N/A;  . TONSILLECTOMY      There were no vitals filed for this visit.  Subjective Assessment - 01/13/17 0850    Subjective  No new complaints. No falls or pain to report.     Pertinent History  HTN, HLD, DM, neurop, tobacco, COPD, OP (lives w/ mom; sister came to stay)    Limitations  Standing;Walking    Patient Stated Goals  to improve the use of her left hand and walk without a walker    Currently in Pain?  No/denies    Pain Score  0-No pain         [facility] Adult PT Treatment/Exercise - 01/13/17 0001      Transfers   Transfers  Sit to Stand;Stand to Sit    Sit to Stand  5: Supervision    Stand to Sit  5: Supervision    Number of Reps  10 reps;2 sets    Comments  with feet across red beam: each rep with emphasis on  tall posture and slow, controlled descent       Ambulation/Gait   Ambulation/Gait  Yes    Ambulation/Gait Assistance  4: Min guard;4: Min assist    Assistive device  None    Gait Pattern  Step-through pattern;Decreased stride length;Poor foot clearance - left;Decreased dorsiflexion - left;Decreased arm swing - right;Decreased arm swing - left    Ambulation Surface  Level;Indoor    Gait Comments  along ~50 foot hallway: fwd gait with speed changes, progressing to fast pace with head movements left<>fwd<>right, then up<>fwd<>down. min guard to min assist for balance with cues on posture, weight shifting and step length.        High Level Balance   High Level Balance Activities  Marching forwards;Marching backwards;Tandem walking toe/heel/tandem walk all fwd/bwd    High Level Balance Comments  on both red mats with no UE support: 3-4 laps  each one with min to mod assist for balance. cues on posture and ex form/technique.           Balance Exercises - 01/13/17 0906      Balance Exercises: Standing   Standing Eyes Opened  Wide (BOA);Head turns;Other reps (comment);Limitations    Standing Eyes Closed  Wide (BOA);Head turns;Foam/compliant surface;Other reps (comment);20 secs;Limitations    SLS with Vectors  Foam/compliant surface;Other reps (comment);Limitations      Balance Exercises: Standing   Standing Eyes Opened Limitations  on doubled blue foam in corner with chair in front for safety: wide base of support progressing toward narrow base of support for EO head movements left<>right and up<>down. min guard to min assist for balance with occaisonal touch to light support on chair back.     Standing Eyes Closed Limitations  on doubled blue foam in corner with chair in front for safety: wide base of support progressing to narrow base of support with EC no head movements, progressing to EC head movements left<>right and up<>down with min assist for balance.     SLS with Vectors Limitations  6 cones along edges of red mats, no UE support: fwd toe taps to each cone with side stepping left<>right x 1 lap each way, then double fwd toe taps to each cone with side stepping left<>right x 1 lap each. min to mod assist for balance          PT Short Term Goals - 12/31/16 1750      PT SHORT TERM GOAL #1   Title  Assess gait velocity and set STG and LTG as appropriate. (Target all STGs 12/08/16)    Baseline  11/16/16: met today with PT to set goals    Status  Achieved      PT SHORT TERM GOAL #2   Title  Patient will be independent with HEP for balance, strength, and gait training.     Time  4    Period  Weeks    Status  Achieved      PT SHORT TERM GOAL #3   Title  Patient will ambulate 250 ft on level/unlevel exterior surfaces with LRAD modified independent.     Baseline  12/07/16  250 ft x 2 level indoor surfaces due to weather     Time  4    Period  Weeks    Status  Unable to assess      PT SHORT TERM GOAL #4   Title  Patient will improve TUG to <15 seconds with LRAD.     Baseline  12/07/16 11.06 sec no device  Time  8    Period  Weeks    Status  Achieved      PT SHORT TERM GOAL #5   Title  Patient will complete FGA with goal set as appropriate. (Target date 01/20/17)    Time  3    Period  Weeks    Status  New    Target Date  01/20/17        PT Long Term Goals - 12/30/16 0948      PT LONG TERM GOAL #1   Title  Patient will be independent with updated HEP for balance, strength and ambulation. (Target date all LTGs 01/07/17)    Time  8    Period  Weeks    Status  Achieved      PT LONG TERM GOAL #2   Title  Patient will verbalize her plan for continued community-based activity plan to assist with reducing stroke risk factors.    Baseline  12/07/16 discussed joining gym vs walking at mall or other large stores    Time  8    Period  Weeks    Status  Achieved      PT LONG TERM GOAL #3   Title  Patient will ambulate 1000 ft on outside surfaces with LRAD at modified independent level; 12/07/16 goal updated to no AD    Baseline  Simulated over mats due to weather with minguard assist    Time  8    Period  Weeks    Status  Not Met      PT LONG TERM GOAL #4   Title  Patient will improve TUG to <13.5 seconds with LRAD.     Baseline  12/07/16 11.06 sec    Time  8    Period  Weeks    Status  Achieved      PT LONG TERM GOAL #5   Title  Patient will ambulate up/down 4 steps with light use of rail and step-over-step sequencing.     Time  4    Period  Weeks    Status  Achieved      Additional Long Term Goals   Additional Long Term Goals  Yes      PT LONG TERM GOAL #6   Title  Patient will ambulate with head turns over level, indoor surface while carrying two simulated bags of groceries of different weights x 200 ft modified independent. (Target date for LTGs 02/10/17)    Time  6    Period   Weeks    Status  New    Target Date  02/10/17      PT LONG TERM GOAL #7   Title  Pateint will ambulate 500 ft over unlevel outdoor surfaces (grass, gravel, curbs, inclines) with no assistive device modified independent. (distance reduced due to winter season)    Time  6    Period  Weeks    Status  New      PT LONG TERM GOAL #8   Title  Patient will carry full laundry basket up/down 12 steps modified independent.     Time  6    Period  Weeks    Status  New      PT LONG TERM GOAL  #9   TITLE  pateint will be independent with updated/final HEP and have a plan for continued community-based fitness program upon discharge from PT.     Time  6    Period  Weeks    Status  New            Plan - 01/13/17 0850    Clinical Impression Statement  Today's skilled session continued to focus on high level balance activities and dynamic gait with no issues reported in session. Pt is progressing toward goals and should benefit from continued PT to progress toward unmet goals.     Rehab Potential  Good    PT Frequency  2x / week updated for recert 32/54/98    PT Duration  6 weeks    PT Treatment/Interventions  ADLs/Self Care Home Management;DME Instruction;Gait training;Stair training;Functional mobility training;Orthotic Fit/Training;Patient/family education;Neuromuscular re-education;Balance training;Therapeutic exercise;Therapeutic activities;Passive range of motion;Cognitive remediation    PT Next Visit Plan  continue to work on high level balance with emphasis on complaint surfaces/vision removed, and add to corner HEP as needed    Consulted and Agree with Plan of Care  Patient       Patient will benefit from skilled therapeutic intervention in order to improve the following deficits and impairments:  Decreased balance, Decreased knowledge of use of DME, Decreased mobility, Decreased strength, Difficulty walking, Impaired UE functional use, Impaired vision/preception, Obesity  Visit  Diagnosis: Unsteadiness on feet  Other abnormalities of gait and mobility     Problem List Patient Active Problem List   Diagnosis Date Noted  . Hemiparesis affecting left side as late effect of cerebrovascular accident (CVA) ([place]) 11/11/2016  . Gait disturbance, post-stroke 11/11/2016  . Dysarthria as late effect of stroke 11/11/2016  . Carotid artery stenosis 11/02/2016  . Right middle cerebral artery stroke ([place]) 10/19/2016  . Left arm weakness   . Diastolic dysfunction   . Hypertension with heart disease   . Tobacco abuse   . Acute blood loss anemia   . Cerebral infarction due to embolism of right middle cerebral artery ([place])   . Stroke (cerebrum) ([place]) 10/15/2016  . Perirectal abscess 09/26/2015  . Vitamin D deficiency 07/23/2015  . Osteopenia 02/16/2013  . Ankle fracture 01/19/2013  . Anemia 09/27/2011  . Uncontrolled type 2 diabetes mellitus with insulin therapy ([place]) 09/24/2011  . Hyperlipidemia 09/24/2011  . Hypertension 09/24/2011    Willow Ora, PTA, [facility] [address] [place], [place] [address] [phone] 01/13/17, 8:44 PM   Name: Sarah [name] MRN: [id number] Date of Birth: May 08, 1945

## 2017-01-14 ENCOUNTER — Other Ambulatory Visit: Payer: Self-pay

## 2017-01-14 ENCOUNTER — Emergency Department (HOSPITAL_COMMUNITY): Payer: Medicare Other

## 2017-01-14 ENCOUNTER — Emergency Department (HOSPITAL_COMMUNITY)
Admission: EM | Admit: 2017-01-14 | Discharge: 2017-01-14 | Disposition: A | Discharge status: Home / Self Care | Payer: Medicare Other | Attending: Emergency Medicine | Admitting: Emergency Medicine

## 2017-01-14 ENCOUNTER — Ambulatory Visit: Payer: Medicare Other | Admitting: Occupational Therapy

## 2017-01-14 ENCOUNTER — Encounter: Payer: Self-pay | Admitting: Occupational Therapy

## 2017-01-14 ENCOUNTER — Ambulatory Visit: Payer: Self-pay

## 2017-01-14 VITALS — BP 160/78

## 2017-01-14 DIAGNOSIS — Z794 Long term (current) use of insulin: Secondary | ICD-10-CM | POA: Insufficient documentation

## 2017-01-14 DIAGNOSIS — Z7982 Long term (current) use of aspirin: Secondary | ICD-10-CM | POA: Insufficient documentation

## 2017-01-14 DIAGNOSIS — Z7902 Long term (current) use of antithrombotics/antiplatelets: Secondary | ICD-10-CM | POA: Insufficient documentation

## 2017-01-14 DIAGNOSIS — Z87891 Personal history of nicotine dependence: Secondary | ICD-10-CM | POA: Diagnosis not present

## 2017-01-14 DIAGNOSIS — R479 Unspecified speech disturbances: Secondary | ICD-10-CM | POA: Diagnosis not present

## 2017-01-14 DIAGNOSIS — R278 Other lack of coordination: Secondary | ICD-10-CM | POA: Diagnosis not present

## 2017-01-14 DIAGNOSIS — I69354 Hemiplegia and hemiparesis following cerebral infarction affecting left non-dominant side: Secondary | ICD-10-CM | POA: Diagnosis not present

## 2017-01-14 DIAGNOSIS — Z79899 Other long term (current) drug therapy: Secondary | ICD-10-CM | POA: Insufficient documentation

## 2017-01-14 DIAGNOSIS — R4781 Slurred speech: Secondary | ICD-10-CM | POA: Diagnosis not present

## 2017-01-14 DIAGNOSIS — I69318 Other symptoms and signs involving cognitive functions following cerebral infarction: Secondary | ICD-10-CM

## 2017-01-14 DIAGNOSIS — R350 Frequency of micturition: Secondary | ICD-10-CM

## 2017-01-14 DIAGNOSIS — M6281 Muscle weakness (generalized): Secondary | ICD-10-CM | POA: Diagnosis not present

## 2017-01-14 DIAGNOSIS — R471 Dysarthria and anarthria: Secondary | ICD-10-CM | POA: Diagnosis not present

## 2017-01-14 DIAGNOSIS — R2681 Unsteadiness on feet: Secondary | ICD-10-CM | POA: Diagnosis not present

## 2017-01-14 DIAGNOSIS — R9431 Abnormal electrocardiogram [ECG] [EKG]: Secondary | ICD-10-CM | POA: Diagnosis not present

## 2017-01-14 DIAGNOSIS — I10 Essential (primary) hypertension: Secondary | ICD-10-CM | POA: Insufficient documentation

## 2017-01-14 DIAGNOSIS — R2689 Other abnormalities of gait and mobility: Secondary | ICD-10-CM | POA: Diagnosis not present

## 2017-01-14 DIAGNOSIS — R4701 Aphasia: Secondary | ICD-10-CM | POA: Diagnosis present

## 2017-01-14 DIAGNOSIS — E119 Type 2 diabetes mellitus without complications: Secondary | ICD-10-CM | POA: Diagnosis not present

## 2017-01-14 DIAGNOSIS — R41842 Visuospatial deficit: Secondary | ICD-10-CM

## 2017-01-14 LAB — URINALYSIS, ROUTINE W REFLEX MICROSCOPIC
Bacteria, UA: NONE SEEN
Bilirubin Urine: NEGATIVE
Glucose, UA: >=500 mg/dL — AB
Hgb urine dipstick: NEGATIVE
Ketones, ur: NEGATIVE mg/dL
Leukocytes, UA: NEGATIVE
Nitrite: NEGATIVE
Protein, ur: 100 mg/dL — AB
Specific Gravity, Urine: 1.031 — ABNORMAL HIGH (ref 1.005–1.030)
pH: 6 (ref 5.0–8.0)

## 2017-01-14 LAB — I-STAT CHEM 8, ED
BUN: 11 mg/dL (ref 6–20)
Calcium, Ion: 1.22 mmol/L (ref 1.15–1.40)
Chloride: 105 mmol/L (ref 101–111)
Creatinine, Ser: 0.5 mg/dL (ref 0.44–1.00)
Glucose, Bld: 313 mg/dL — ABNORMAL HIGH (ref 65–99)
HCT: 41 % (ref 36.0–46.0)
Hemoglobin: 13.9 g/dL (ref 12.0–15.0)
Potassium: 4 mmol/L (ref 3.5–5.1)
Sodium: 141 mmol/L (ref 135–145)
TCO2: 26 mmol/L (ref 22–32)

## 2017-01-14 LAB — I-STAT TROPONIN, ED: Troponin i, poc: 0.01 ng/mL (ref 0.00–0.08)

## 2017-01-14 LAB — ETHANOL: Alcohol, Ethyl (B): <10 mg/dL (ref <10)

## 2017-01-14 LAB — RAPID URINE DRUG SCREEN, HOSP PERFORMED
Amphetamines: NOT DETECTED
Barbiturates: NOT DETECTED
Benzodiazepines: NOT DETECTED
Cocaine: NOT DETECTED
Opiates: NOT DETECTED
Tetrahydrocannabinol: NOT DETECTED

## 2017-01-14 NOTE — ED Provider Notes (Signed)
MSE was initiated and I personally evaluated the patient and placed orders (if any) at  12:46 PM on January 14, 2017.  I was asked to evaluate the patient in triage.  Patient presenting today with elevated blood pressure and concern for stroke like symptoms.  Patient states she woke up this morning with some intermittent back pain.  While she was at physical therapy, blood pressure was noted to be elevated at 161.  She denies weakness, slurred speech, numbness, or difficulty walking.  She states that she goes to speech therapy, and her speech is normal for her.  She did not take her blood pressure medications this morning.  She had a stroke in October, has had some residual left-sided weakness since.  Physical exam shows patient was slightly elevated blood pressure. There is mild facial asymmetry, question if this is baseline due to residual left-sided weakness.  No obvious right-sided facial droop.  Grip strength decreased on the left side, baseline per patient.  Sensation intact bilaterally.  Negative Romberg's.  Negative pronator drift.  Patient ambulatory without difficulty.  Will order labs and CT head.  No code stroke called at this time.  Case discussed with attending, Dr. Ellender Hose agrees.  The patient appears stable so that the remainder of the MSE may be completed by another provider.   Franchot Heidelberg, PA-C 01/14/17 1648    Duffy Bruce, MD 01/15/17 [phone]

## 2017-01-14 NOTE — ED Provider Notes (Signed)
[facility] EMERGENCY DEPARTMENT Provider Note   CSN: [id number] Arrival date & time: 01/14/17  1155     History   Chief Complaint Chief Complaint  Patient presents with  . Aphasia    HPI Sarah [name] is a 72 y.o. female.  Sarah [name] is a 72 y.o. Female who presents to the emergency department after being sent by her physical therapist for possible worsening slurred speech.  Patient has a history of a right MCA infarct back in October 2018.  She reports she is been doing better since the stroke.  She sees a speech therapist and a physical therapist.  She reports today she was with her physical therapist who felt like her speech was more slurred.  The patient's sister and the patient feel like her speech is unchanged.  She chronically has some slurred speech since her stroke back in October.  Patient feels better than she has recently.  She has no worsening symptoms from her baseline.  She tells me that she is now ambulating without a walker and does not need to use a cane any longer.  She denies any extremity weakness or numbness.  No head injury or headache.  She reports over the past 2 months she has been having some intermittent bilateral low back pain as well as some urinary frequency.  She denies dysuria.  She does report that she forgot to take her blood pressure medicines this morning.  She denies fevers, head injury, numbness, tingling, weakness, double vision, chest pain, coughing, shortness breath, dysuria, hematuria, abdominal pain, nausea or vomiting.   The history is provided by the patient and medical records. No language interpreter was used.    Past Medical History:  Diagnosis Date  . Boils   . Glaucoma   . Heart murmur   . HTN (hypertension)   . Hx of adenomatous colonic polyps   . Hyperlipidemia   . Osteoporosis   . Pneumonia   . Stroke ([place])   . Type II or unspecified type diabetes mellitus without mention of complication,  uncontrolled     Patient Active Problem List   Diagnosis Date Noted  . Hemiparesis affecting left side as late effect of cerebrovascular accident (CVA) ([place]) 11/11/2016  . Gait disturbance, post-stroke 11/11/2016  . Dysarthria as late effect of stroke 11/11/2016  . Carotid artery stenosis 11/02/2016  . Right middle cerebral artery stroke ([place]) 10/19/2016  . Left arm weakness   . Diastolic dysfunction   . Hypertension with heart disease   . Tobacco abuse   . Acute blood loss anemia   . Cerebral infarction due to embolism of right middle cerebral artery ([place])   . Stroke (cerebrum) ([place]) 10/15/2016  . Perirectal abscess 09/26/2015  . Vitamin D deficiency 07/23/2015  . Osteopenia 02/16/2013  . Ankle fracture 01/19/2013  . Anemia 09/27/2011  . Uncontrolled type 2 diabetes mellitus with insulin therapy ([place]) 09/24/2011  . Hyperlipidemia 09/24/2011  . Hypertension 09/24/2011    Past Surgical History:  Procedure Laterality Date  . ABDOMINAL HYSTERECTOMY    . CARPAL TUNNEL RELEASE     right  . COLONOSCOPY  12-10-10   per Dr. Deatra Ina, clear, repeat in 7 yrs   . INCISION AND DRAINAGE PERIRECTAL ABSCESS N/A 09/26/2015   Procedure: IRRIGATION AND DEBRIDEMENT PERIRECTAL ABSCESS;  Surgeon: Mickeal Skinner, MD;  Location: [facility];  Service: General;  Laterality: N/A;  . KNEE ARTHROSCOPY     right  . LOOP RECORDER INSERTION N/A  10/19/2016   Procedure: LOOP RECORDER INSERTION;  Surgeon: Thompson Grayer, MD;  Location: [facility];  Service: Cardiovascular;  Laterality: N/A;  . ORIF ANKLE FRACTURE Right 03/24/2012   Procedure: OPEN REDUCTION INTERNAL FIXATION (ORIF) ANKLE FRACTURE;  Surgeon: Newt Minion, MD;  Location: [facility];  Service: Orthopedics;  Laterality: Right;  Open Reduction Internal Fixation Right Bimalleolar ankle fracture  . POLYPECTOMY    . TEE WITHOUT CARDIOVERSION N/A 10/18/2016   Procedure: TRANSESOPHAGEAL ECHOCARDIOGRAM (TEE);  Surgeon: Fay Records, MD;  [facility]  ENDOSCOPY;  Service: Cardiovascular;  Laterality: N/A;  . TONSILLECTOMY      OB History    No data available       Home Medications    Prior to Admission medications   Medication Sig Start Date End Date Taking? Authorizing Provider  amLODipine (NORVASC) 5 MG tablet Take 1 tablet (5 mg total) by mouth daily. 12/07/16   Rosalin Hawking, MD  aspirin EC 325 MG tablet TAKE 1 TABLET BY MOUTH DAILY 12/10/16   [name], [name], MD  atorvastatin (LIPITOR) 40 MG tablet Take 1 tablet (40 mg total) by mouth daily. 12/08/16   [name], [name], MD  clopidogrel (PLAVIX) 75 MG tablet TAKE 1 TABLET BY MOUTH DAILY 01/13/17   [name], [name], MD  ibuprofen (ADVIL,MOTRIN) 800 MG tablet TK 1 T PO Q 6 H PRN P 10/15/16   [provider]  Insulin Glargine (LANTUS) 100 UNIT/ML Solostar Pen 15 U in the morning and 20 U pm. 12/08/16   [name], [name], MD  TRAVATAN Z 0.004 % SOLN ophthalmic solution Place 1 drop into both eyes at bedtime. 10/07/16   [provider]  Vitamin D, Ergocalciferol, (DRISDOL) 50000 units CAPS capsule TAKE ONE CAPSULE BY MOUTH TWICE A WEEK ON MONDAY AND THURSDAY 10/29/16   [name], [name], MD    Family History Family History  Problem Relation Age of Onset  . Diabetes Mother   . Hypertension Mother   . Stroke Father   . Stroke Maternal Uncle   . Stroke Paternal Uncle   . Colon cancer Neg Hx   . Esophageal cancer Neg Hx   . Rectal cancer Neg Hx   . Stomach cancer Neg Hx     Social History Social History   Tobacco Use  . Smoking status: Former Smoker    Types: Cigarettes    Last attempt to quit: 10/15/2016    Years since quitting: 0.2  . Smokeless tobacco: Never Used  . Tobacco comment: smokes occ.   Substance Use Topics  . Alcohol use: Yes    Alcohol/week: 0.0 oz    Comment: occ  . Drug use: No     Allergies   Patient has no known allergies.   Review of Systems Review of Systems  Constitutional: Negative for chills and fever.  HENT: Negative for  congestion and sore throat.   Eyes: Negative for visual disturbance.  Respiratory: Negative for cough and shortness of breath.   Cardiovascular: Negative for chest pain.  Gastrointestinal: Negative for abdominal pain, diarrhea, nausea and vomiting.  Genitourinary: Negative for dysuria.  Musculoskeletal: Negative for back pain and neck pain.  Skin: Negative for rash.  Neurological: Negative for dizziness, syncope, speech difficulty (unchanged ), weakness, light-headedness and headaches.     Physical Exam Updated Vital Signs BP (!) 148/66   Pulse 71   Temp 98.2 F (36.8 C) (Oral)   Resp 20   SpO2 95%   Physical Exam  Constitutional: She is oriented to person, place, and time. She appears well-developed and well-nourished. No distress.  Nontoxic appearing.  HENT:  Head: Normocephalic and atraumatic.  Mouth/Throat: Oropharynx is clear and moist.  Eyes: Conjunctivae and EOM are normal. Pupils are equal, round, and reactive to light. Right eye exhibits no discharge. Left eye exhibits no discharge.  Neck: Normal range of motion. Neck supple.  Cardiovascular: Normal rate, regular rhythm, normal heart sounds and intact distal pulses. Exam reveals no gallop and no friction rub.  No murmur heard. Pulmonary/Chest: Effort normal and breath sounds normal. No respiratory distress. She has no wheezes. She has no rales.  Abdominal: Soft. There is no tenderness. There is no guarding.  Musculoskeletal: Normal range of motion. She exhibits no edema, tenderness or deformity.  Lymphadenopathy:    She has no cervical adenopathy.  Neurological: She is alert and oriented to person, place, and time. No cranial nerve deficit or sensory deficit. She exhibits normal muscle tone. Coordination normal.  Patient is alert and oriented 3. Speech slightly slurred, patient reports this is her baseline.   Cranial nerves are intact. Sensation and strength is intact to bilateral upper and lower extremities. Normal  gait. Finger to nose intact. No pronator drift. EOMs intact. Vision grossly intact.   Skin: Skin is warm and dry. Capillary refill takes less than 2 seconds. No rash noted. She is not diaphoretic. No erythema. No pallor.  Psychiatric: She has a normal mood and affect. Her behavior is normal.  Nursing note and vitals reviewed.    ED Treatments / Results  Labs (all labs ordered are listed, but only abnormal results are displayed) Labs Reviewed  URINALYSIS, ROUTINE W REFLEX MICROSCOPIC - Abnormal; Notable for the following components:      Result Value   Specific Gravity, Urine 1.031 (*)    Glucose, UA >=500 (*)    Protein, ur 100 (*)    Squamous Epithelial / LPF 0-5 (*)    All other components within normal limits  I-STAT CHEM 8, ED - Abnormal; Notable for the following components:   Glucose, Bld 313 (*)    All other components within normal limits  ETHANOL  RAPID URINE DRUG SCREEN, HOSP PERFORMED  I-STAT TROPONIN, ED  I-STAT CHEM 8, ED  I-STAT TROPONIN, ED  CBG MONITORING, ED    EKG  EKG Interpretation  Date/Time:  Friday January 14 2017 12:03:18 EST Ventricular Rate:  83 PR Interval:  166 QRS Duration: 72 QT Interval:  338 QTC Calculation: 397 R Axis:   66 Text Interpretation:  Normal sinus rhythm Nonspecific ST and T wave abnormality Abnormal ECG No significant change since last tracing Confirmed by Isla Pence [phone]) on 01/14/2017 2:10:33 PM       Radiology Ct Head Wo Contrast  Result Date: 01/14/2017 CLINICAL DATA:  Slurred speech. Unsteady gait. Left hand weakness. Left leg heaviness. EXAM: CT HEAD WITHOUT CONTRAST TECHNIQUE: Contiguous axial images were obtained from the base of the skull through the vertex without intravenous contrast. COMPARISON:  Brain MRI 10/19/2016 FINDINGS: Brain: There is no evidence of acute large territory infarct, intracranial hemorrhage, mass, midline shift, or extra-axial fluid collection. Chronic infarcts are noted in the right  frontal and parietal lobes involving white matter greater than cortex, acute on the prior MRI. Bilateral periventricular white matter hypodensities are nonspecific but compatible with mild chronic small vessel ischemic disease. Vascular: Calcified atherosclerosis at the skullbase. No hyperdense vessel. Skull: No fracture or suspicious osseous lesion. Sinuses/Orbits: Visualized paranasal sinuses and  mastoid air cells are clear. Orbits are unremarkable. Other: None. IMPRESSION: 1. No evidence of acute intracranial abnormality. 2. Chronic small vessel ischemic disease including chronic right frontoparietal infarcts. Electronically Signed   By: Logan Bores M.D.   On: 01/14/2017 12:41    Procedures Procedures (including critical care time)  Medications Ordered in ED Medications - No data to display   Initial Impression / Assessment and Plan / ED Course  I have reviewed the triage vital signs and the nursing notes.  Pertinent labs & imaging results that were available during my care of the patient were reviewed by me and considered in my medical decision making (see chart for details).    This  is a 72 y.o. Female who presents to the emergency department after being sent by her physical therapist for possible worsening slurred speech.  Patient has a history of a right MCA infarct back in October 2018.  She reports she is been doing better since the stroke.  She sees a speech therapist and a physical therapist.  She reports today she was with her physical therapist who felt like her speech was more slurred.  The patient's sister and the patient feel like her speech is unchanged.  She chronically has some slurred speech since her stroke back in October.  Patient feels better than she has recently.  She has no worsening symptoms from her baseline.  She tells me that she is now ambulating without a walker and does not need to use a cane any longer.  She denies any extremity weakness or numbness.  No head  injury or headache.  She reports over the past 2 months she has been having some intermittent bilateral low back pain as well as some urinary frequency.  She denies dysuria.  She does report that she forgot to take her blood pressure medicines this morning.  She has plenty at home and does not need refills. She just forgot today.  On exam the patient is afebrile and nontoxic-appearing.  She has very mild slurred speech.  Patient and her sister report this is her baseline and has actually improved since her recent stroke.  She has no focal neurological deficits.  She is able to ambulate with normal gait.  Patient reports she feels at her baseline. Urinalysis without sign of infection.  She has glucosuria.  I-STAT Chem-8 shows a glucose of 313.  Patient reports she will go home and take her medications.  Troponin is normal.  Ethanol is negative. CT head without contrast shows no evidence of acute intracranial abnormality. As patient is at her baseline per patient and her sister we see no need for additional work up. Will discharge with close follow-up by her primary care doctor and neurology.  Return precautions discussed. I advised the patient to follow-up with their primary care provider this week. I advised the patient to return to the emergency department with new or worsening symptoms or new concerns. The patient verbalized understanding and agreement with plan.    This patient was discussed with and evaluated by Dr. Gilford Raid who agrees with assessment and plan.   Final Clinical Impressions(s) / ED Diagnoses   Final diagnoses:  Slurred speech  Frequency of urination    ED Discharge Orders    None       Sharmaine Base 01/14/17 1535    Isla Pence, MD 01/14/17 1544

## 2017-01-14 NOTE — ED Triage Notes (Signed)
Ellender Hose, MD at bedside will advise on calling [facility]

## 2017-01-14 NOTE — ED Triage Notes (Signed)
Pt reports having slurred speech onset today @ 6am, Isaacs, MD and this RN reviewed note in my chart, pt weakness reported to be upon awakening, pt hx of CVA and denies slurred speech at this time, [place], PA evaluated pt and Isaacs, MD spoke with the pt, pt A&O x4

## 2017-01-14 NOTE — Therapy (Signed)
[facility] [address] [place], [place], [address] Phone: [phone]   Fax:  [phone]  Occupational Therapy Treatment  Patient Details  Name: Sarah [name] MRN: [id number] Date of Birth: 1945/05/13 Referring Provider (Historical): Dr. Letta Pate   Encounter Date: 01/14/2017  OT End of Session - 01/14/17 1232    Visit Number  9    Number of Visits  16    Date for OT Re-Evaluation  01/25/17    Authorization Type  [place] will need PN and G code every 10th visit    Authorization Time Period  60 days    Authorization - Visit Number  9    Authorization - Number of Visits  10    OT Start Time  1019 pt charged only 1 unit for pt education regarding current situation a session was terminated    OT Stop Time  1058    OT Time Calculation (min)  39 min    Activity Tolerance  Treatment limited secondary to medical complications (Comment)       Past Medical History:  Diagnosis Date  . Boils   . Glaucoma   . Heart murmur   . HTN (hypertension)   . Hx of adenomatous colonic polyps   . Hyperlipidemia   . Osteoporosis   . Pneumonia   . Stroke ([place])   . Type II or unspecified type diabetes mellitus without mention of complication, uncontrolled     Past Surgical History:  Procedure Laterality Date  . ABDOMINAL HYSTERECTOMY    . CARPAL TUNNEL RELEASE     right  . COLONOSCOPY  12-10-10   per Dr. Deatra Ina, clear, repeat in 7 yrs   . INCISION AND DRAINAGE PERIRECTAL ABSCESS N/A 09/26/2015   Procedure: IRRIGATION AND DEBRIDEMENT PERIRECTAL ABSCESS;  Surgeon: Mickeal Skinner, MD;  Location: [facility];  Service: General;  Laterality: N/A;  . KNEE ARTHROSCOPY     right  . LOOP RECORDER INSERTION N/A 10/19/2016   Procedure: LOOP RECORDER INSERTION;  Surgeon: Thompson Grayer, MD;  Location: [facility];  Service: Cardiovascular;  Laterality: N/A;  . ORIF ANKLE FRACTURE Right 03/24/2012   Procedure: OPEN  REDUCTION INTERNAL FIXATION (ORIF) ANKLE FRACTURE;  Surgeon: Newt Minion, MD;  Location: [facility];  Service: Orthopedics;  Laterality: Right;  Open Reduction Internal Fixation Right Bimalleolar ankle fracture  . POLYPECTOMY    . TEE WITHOUT CARDIOVERSION N/A 10/18/2016   Procedure: TRANSESOPHAGEAL ECHOCARDIOGRAM (TEE);  Surgeon: Fay Records, MD;  Location: [facility];  Service: Cardiovascular;  Laterality: N/A;  . TONSILLECTOMY      Vitals:   01/14/17 1024  BP: (!) 160/78    Subjective Assessment - 01/14/17 1024    Subjective   I know I am walking funny today I think it is from back pain; and I am so tired today I didn't sleep well last night.      Pertinent History  Pt with R CVA    Currently in Pain?  Yes    Pain Score  4     Pain Location  Back    Pain Orientation  Mid;Lower    Pain Descriptors / Indicators  Aching    Pain Type  Acute pain    Pain Onset  Today    Pain Frequency  Intermittent    Aggravating Factors   certain movements, walking    Pain Relieving Factors  avoid those movements, not really.  OT Short Term Goals - 01/14/17 1048      OT SHORT TERM GOAL #1   Title  Pt will be mod I with HEP for coordination and grip/pinch strength LUE - 12/28/2016    Status  Achieved      OT SHORT TERM GOAL #2   Title  Pt will demonstrate improved coordination in LUE as evidenced by decreasing time on 9 hole peg test by at least 20 seconds to assist with functional tasks. (baseline= 1.04.47)    Status  Achieved 12/28/2016  2 trials 49.20 and then 30.20      OT SHORT TERM GOAL #3   Title  Pt will be min a for hot meal prep at ambulatory level and demonstrate good safety awareness    Status  Achieved      OT SHORT TERM GOAL #4   Title  Pt will demonstrate improved grip strength LUE by at least 5 pounds to assist with opening jars (baseline = 10 pounds)    Status  Achieved 12/23/2016  30 pounds        OT Long Term Goals -  01/14/17 1048      OT LONG TERM GOAL #1   Title  Pt will be mod I with upgraded HEP - 01/25/2017    Status  On-going      OT LONG TERM GOAL #2   Title  Pt will demonstrate improved grip strength to at least 38 pounds in LUE to assist with home mgmt tasks (baseline = 10 pounds)    Status  On-going 12/23/2016  30 pounds therefore met original goal.   revised to above goal.      OT LONG TERM GOAL #3   Title  Pt will demonstrate improved coordination as evidenced by decreasing time on 9 hole peg test by at least 25 seconds to reduce effort for buttoning, tying    Status  On-going 12/28/2016  2 trials 49.2 and 30.20 will monitor for consistent performance      OT LONG TERM GOAL #4   Title  Pt will demonstrate ability to type simple email with extra time.     Status  On-going      OT LONG TERM GOAL #5   Title  Pt will be mod I with hot meal prep.    Status  On-going      OT LONG TERM GOAL #6   Title  Pt will be mod I with home mgmt tasks such as cleaning, laundry    Status  On-going 12/28/2016 pt reports that she is doing parts of laundry with family's help, dishes and some vacuuming      OT LONG TERM GOAL #7   Title  Pt will verbalize understanding of return to driving recommendations.     Status  On-going      OT LONG TERM GOAL #8   Title  Pt will demonstrate improved LUE functional use as evidenced by improving score on Box and Blocks by a least 4 blocks to assist with IADL's (baseline = 30)    Status  On-going            Plan - 01/14/17 1048    Clinical Impression Statement  Pt arrived today feeling "very tired"  with some low back pain onset this am.  Pt with increased balance disturbance, increased gait disturvance,  increased slurred speech, increased weakness in L grip strength (from 30 to 20 pounds), increased LUE drift with eyes closed, c/o heaviness in  LLE,  BP slightly elevated at 160/78.  Pt reports no headache.  Spoke with pt's MD office who recommneded pt got to ED  for assessment given h/o of stroke. Pt drove herself to clinic today - offered to call 911 to transport pt; pt refused stating "I am only a few minutes away and I feel fine to drive there."  Pt encouraged to drive straight to ED and call this clinic upon arrival. Pt verbalized understanding.      Rehab Potential  Good    OT Frequency  2x / week    OT Duration  8 weeks    OT Treatment/Interventions  Self-care/ADL training;Aquatic Therapy;Electrical Stimulation;Moist Heat;Ultrasound;Therapeutic exercise;Neuromuscular education;DME and/or AE instruction;Manual Therapy;Therapist, nutritional;Therapeutic activities;Cognitive remediation/compensation;Visual/perceptual remediation/compensation;Patient/family education;Balance training    Plan  functional mobility, balance, in hand manipulation, grip strength, functional use of L hand, home mgmt tasks, work toward typing    Consulted and Agree with Plan of Care  Patient      Session terminated after assessing pt and calling MD at pt's request due to medical issues today.   Patient will benefit from skilled therapeutic intervention in order to improve the following deficits and impairments:  Abnormal gait, Decreased activity tolerance, Decreased cognition, Decreased mobility, Decreased knowledge of use of DME, Decreased coordination, Decreased strength, Impaired UE functional use, Impaired vision/preception  Visit Diagnosis: Unsteadiness on feet  Muscle weakness (generalized)  Hemiplegia and hemiparesis following cerebral infarction affecting left non-dominant side (HCC)  Other symptoms and signs involving cognitive functions following cerebral infarction  Visuospatial deficit  Other lack of coordination    Problem List Patient Active Problem List   Diagnosis Date Noted  . Hemiparesis affecting left side as late effect of cerebrovascular accident (CVA) ([place]) 11/11/2016  . Gait disturbance, post-stroke 11/11/2016  . Dysarthria as late  effect of stroke 11/11/2016  . Carotid artery stenosis 11/02/2016  . Right middle cerebral artery stroke ([place]) 10/19/2016  . Left arm weakness   . Diastolic dysfunction   . Hypertension with heart disease   . Tobacco abuse   . Acute blood loss anemia   . Cerebral infarction due to embolism of right middle cerebral artery ([place])   . Stroke (cerebrum) ([place]) 10/15/2016  . Perirectal abscess 09/26/2015  . Vitamin D deficiency 07/23/2015  . Osteopenia 02/16/2013  . Ankle fracture 01/19/2013  . Anemia 09/27/2011  . Uncontrolled type 2 diabetes mellitus with insulin therapy ([place]) 09/24/2011  . Hyperlipidemia 09/24/2011  . Hypertension 09/24/2011    Quay Burow, OTR/L 01/14/2017, 12:35 PM  [facility] [address] [place], [place], [address] Phone: [phone]   Fax:  [phone]  Name: Sarah [name] MRN: [id number] Date of Birth: 12-Dec-1945

## 2017-01-14 NOTE — Telephone Encounter (Signed)
rec'd call from OT at [facility].  Reported pt. presented today with unsteady gait, slurred speech, increased weakness of left hand.  Reported the pt. C/o back pain and feeling heaviness in left leg this AM, upon arising.  Reported BP increased from her normal ; usually 145/70, and this AM 160/78.  reported pt. Denies chest pain or shortness of breath.  Reported hx of stroke on 10/15/16.   Advised, per protocol, with change in Neuro status, should go to the ER.  Occupational Therapist advised she will inform the pt's sister to take her to the ER at this time.     Reason for Disposition . [1] Weakness (i.e., paralysis, loss of muscle strength) of the face, arm / hand, or leg / foot on one side of the body AND [2] sudden onset AND [3] brief (now gone)  Answer Assessment - Initial Assessment Questions 1. SYMPTOM: "What is the main symptom you are concerned about?" (e.g., weakness, numbness)     Unsteady gait, increased weakness left hand, speech slurred 2. ONSET: "When did this start?" (minutes, hours, days; while sleeping)     3. LAST NORMAL: "When was the last time you were normal (no symptoms)?"     This AM with back pain and difficulty with balance 4. PATTERN "Does this come and go, or has it been constant since it started?"  "Is it present now?"     Sudden onset today 5. CARDIAC SYMPTOMS: "Have you had any of the following symptoms: chest pain, difficulty breathing, palpitations?"     No shortness of breath, chest pain, or palpitations  6. NEUROLOGIC SYMPTOMS: "Have you had any of the following symptoms: headache, dizziness, vision loss, double vision, changes in speech, unsteady on your feet?"     Denied headache today. Speech slurred, and c/o heaviness left LE 7. OTHER SYMPTOMS: "Do you have any other symptoms?"     Around Christmas, increased urination; denies urinary burning or urgency  8. PREGNANCY: "Is there any chance you are pregnant?" "When was your last menstrual period?"  no  Protocols used: NEUROLOGIC DEFICIT-A-AH

## 2017-01-14 NOTE — Telephone Encounter (Signed)
Confirmed patient is in the ER now.

## 2017-01-14 NOTE — ED Notes (Signed)
Pt ambulated to the restroom with out difficulty.

## 2017-01-14 NOTE — Telephone Encounter (Signed)
Monitor for ER arrival 

## 2017-01-14 NOTE — ED Provider Notes (Signed)
I was called to assist the patient in triage.  The patient reportedly had increasing hypertension as well as possible new weakness at physical therapy today.  On my assessment, she has no new focal neurological deficits compared to her chronic deficits from stroke.  Her last known normal was last night and she awoke with the symptoms.  She does not have any visual changes, aphasia, or neglect.  She does not meet code stroke criteria at this time.  Will order stroke workup, monitor blood pressure, and patient will be seen in the acute emergency setting.   Duffy Bruce, MD 01/14/17 1236

## 2017-01-14 NOTE — Discharge Instructions (Signed)
Keep up the good work. I am glad you have stopped smoking. Work up here was reassuring.

## 2017-01-17 ENCOUNTER — Ambulatory Visit (INDEPENDENT_AMBULATORY_CARE_PROVIDER_SITE_OTHER): Payer: Medicare Other | Admitting: *Deleted

## 2017-01-17 DIAGNOSIS — I639 Cerebral infarction, unspecified: Secondary | ICD-10-CM

## 2017-01-17 NOTE — Telephone Encounter (Signed)
Walked patient though how to send a manual transmission. Patient reports not sleeping by her home monitor - I educated about the importance of sleeping next to her home monitor. Patient verbalized understanding.

## 2017-01-18 ENCOUNTER — Ambulatory Visit: Payer: Medicare Other | Admitting: Physical Therapy

## 2017-01-18 ENCOUNTER — Ambulatory Visit: Payer: Medicare Other | Admitting: Speech Pathology

## 2017-01-18 ENCOUNTER — Encounter: Payer: Self-pay | Admitting: Occupational Therapy

## 2017-01-18 ENCOUNTER — Other Ambulatory Visit: Payer: Self-pay

## 2017-01-18 ENCOUNTER — Encounter: Payer: Self-pay | Admitting: Physical Therapy

## 2017-01-18 ENCOUNTER — Ambulatory Visit: Payer: Medicare Other | Admitting: Occupational Therapy

## 2017-01-18 ENCOUNTER — Encounter: Payer: Self-pay | Admitting: Speech Pathology

## 2017-01-18 DIAGNOSIS — R41842 Visuospatial deficit: Secondary | ICD-10-CM

## 2017-01-18 DIAGNOSIS — R2689 Other abnormalities of gait and mobility: Secondary | ICD-10-CM | POA: Diagnosis not present

## 2017-01-18 DIAGNOSIS — R278 Other lack of coordination: Secondary | ICD-10-CM

## 2017-01-18 DIAGNOSIS — R471 Dysarthria and anarthria: Secondary | ICD-10-CM | POA: Diagnosis not present

## 2017-01-18 DIAGNOSIS — R2681 Unsteadiness on feet: Secondary | ICD-10-CM

## 2017-01-18 DIAGNOSIS — I69318 Other symptoms and signs involving cognitive functions following cerebral infarction: Secondary | ICD-10-CM

## 2017-01-18 DIAGNOSIS — M6281 Muscle weakness (generalized): Secondary | ICD-10-CM

## 2017-01-18 DIAGNOSIS — I69354 Hemiplegia and hemiparesis following cerebral infarction affecting left non-dominant side: Secondary | ICD-10-CM

## 2017-01-18 NOTE — Patient Instructions (Signed)
While you are on hold for OT please make sure you do your home exercise program for your hand every single day, twice a day!!  Then we will bring you back and see where you are and make a decision from there!   1. Putty program - make sure you do LONG, HARD squeezes and do 20 in the morning and 20 in the evening. Also roll out and pinch! 2. Coordination program - pennies, cards, small ball, nuts and bolts, etc.  Spend 15-20 minutes, twice a day. 3. Practice typing!!  Do this daily!!  Our goal is that you can type a simple email!!   We will bring you back in two weeks!  Work hard and good luck!! Santiago Glad

## 2017-01-18 NOTE — Patient Instructions (Signed)
    SLOW LOUD OVER-ENNUNCIATE PAUSE    BUTTERCUP  CATERPILLAR  [place]  SLOW AND BIG - EXAGGERATE YOUR MOUTH, MAKE EACH CONSONANT

## 2017-01-18 NOTE — Therapy (Signed)
[facility] [address] [place], [place], [address] Phone: [phone]   Fax:  [phone]  Speech Language Pathology Treatment  Patient Details  Name: Sarah [name] MRN: [id number] Date of Birth: 11-Dec-1945 Referring Provider: Alysia Penna, MD   Encounter Date: 01/18/2017  End of Session - 01/18/17 1149    Visit Number  4    Number of Visits  17    Date for SLP Re-Evaluation  03/04/17    SLP Start Time  1100    SLP Stop Time   1142    SLP Time Calculation (min)  42 min    Activity Tolerance  Patient tolerated treatment well       Past Medical History:  Diagnosis Date  . Boils   . Glaucoma   . Heart murmur   . HTN (hypertension)   . Hx of adenomatous colonic polyps   . Hyperlipidemia   . Osteoporosis   . Pneumonia   . Stroke ([place])   . Type II or unspecified type diabetes mellitus without mention of complication, uncontrolled     Past Surgical History:  Procedure Laterality Date  . ABDOMINAL HYSTERECTOMY    . CARPAL TUNNEL RELEASE     right  . COLONOSCOPY  12-10-10   per Dr. Deatra Ina, clear, repeat in 7 yrs   . INCISION AND DRAINAGE PERIRECTAL ABSCESS N/A 09/26/2015   Procedure: IRRIGATION AND DEBRIDEMENT PERIRECTAL ABSCESS;  Surgeon: Mickeal Skinner, MD;  Location: [facility];  Service: General;  Laterality: N/A;  . KNEE ARTHROSCOPY     right  . LOOP RECORDER INSERTION N/A 10/19/2016   Procedure: LOOP RECORDER INSERTION;  Surgeon: Thompson Grayer, MD;  Location: [facility];  Service: Cardiovascular;  Laterality: N/A;  . ORIF ANKLE FRACTURE Right 03/24/2012   Procedure: OPEN REDUCTION INTERNAL FIXATION (ORIF) ANKLE FRACTURE;  Surgeon: Newt Minion, MD;  Location: [facility];  Service: Orthopedics;  Laterality: Right;  Open Reduction Internal Fixation Right Bimalleolar ankle fracture  . POLYPECTOMY    . TEE WITHOUT CARDIOVERSION N/A 10/18/2016   Procedure: TRANSESOPHAGEAL ECHOCARDIOGRAM (TEE);   Surgeon: Fay Records, MD;  Location: [facility];  Service: Cardiovascular;  Laterality: N/A;  . TONSILLECTOMY      There were no vitals filed for this visit.  Subjective Assessment - 01/18/17 1104    Subjective  "When I talk I have that drawl like"    Currently in Pain?  Yes    Pain Score  2     Pain Location  Back    Pain Orientation  -- lower    Pain Descriptors / Indicators  Aching;Discomfort    Pain Type  Acute pain    Pain Onset  1 to 4 weeks ago    Pain Frequency  Intermittent    Aggravating Factors   movements    Pain Relieving Factors  rest            ADULT SLP TREATMENT - 01/18/17 1107      General Information   Behavior/Cognition  Alert;Cooperative;Pleasant mood      Treatment Provided   Treatment provided  Cognitive-Linquistic      Cognitive-Linquistic Treatment   Treatment focused on  Dysarthria    Skilled Treatment  Pt mod I with abdmonimal breathing in isolation. Abdominal breathing in structured HEP with mod I. Pt utiliized compensations in HEP and structured with occasional min A.  When ST interjected conversation into structured tasks, pt required usual mod A to  carryover over articulation and volume.       Assessment / Recommendations / Plan   Plan  Continue with current plan of care      Progression Toward Goals   Progression toward goals  Progressing toward goals       SLP Education - 01/18/17 1146    Education provided  Yes    Education Details  abdominal breathing; compensations for dysarthria    Person(s) Educated  Patient    Methods  Explanation;Demonstration;Verbal cues    Comprehension  Verbalized understanding;Returned demonstration;Need further instruction;Verbal cues required       SLP Short Term Goals - 01/18/17 1148      SLP SHORT TERM GOAL #1   Title  pt will perform HEP with rare min A over three sessions    Time  3    Period  Weeks    Status  On-going      SLP SHORT TERM GOAL #2   Title  pt will perform respiratory  muscle training exercises with modified independence over two sessions    Time  3    Period  Weeks    Status  On-going      SLP SHORT TERM GOAL #3   Title  pt will demo compensatory strategies for intelligibility 70% of the time in 10 minutes simple conversation    Time  3    Period  Weeks    Status  On-going      SLP SHORT TERM GOAL #4   Title  pt will demo abdominal breathing 80% of the time in sentence responses x3 sessions    Time  3    Period  Weeks    Status  On-going       SLP Long Term Goals - 01/18/17 1149      SLP LONG TERM GOAL #1   Title  pt will use compensatory strategies for intelligibilty with modified independence in 10 minutes mod complex conversation x2 sessions    Time  7    Period  Weeks    Status  On-going      SLP LONG TERM GOAL #2   Title  pt will demo respiratory muscle training exercises with independence x2 sessions    Time  7    Period  Weeks    Status  On-going      SLP LONG TERM GOAL #3   Title  pt will perform HEP for dysarthria with modified independence x3 sessions    Time  7    Period  Weeks    Status  On-going      SLP LONG TERM GOAL #4   Title  pt will demo abdominal breathing functionally in 10 mintues mod complex conversation     Time  7    Period  Weeks    Status  On-going       Plan - 01/18/17 1147    Clinical Impression Statement  Pt continues with mild dysarthria in conversation. Pt utilized abdominal breathing and over articulation in structured tasks with occasional min A, however she required verbal cues, mod A to carryover strategies to conversation. Continue skilled ST to maximize intellgibiltiy    Speech Therapy Frequency  2x / week    Treatment/Interventions  Oral motor exercises;SLP instruction and feedback;Environmental controls;Cueing hierarchy;Compensatory strategies;Internal/external aids;Patient/family education    Potential to Achieve Goals  Good       Patient will benefit from skilled therapeutic  intervention in order to improve the following deficits  and impairments:   Dysarthria and anarthria    Problem List Patient Active Problem List   Diagnosis Date Noted  . Hemiparesis affecting left side as late effect of cerebrovascular accident (CVA) ([place]) 11/11/2016  . Gait disturbance, post-stroke 11/11/2016  . Dysarthria as late effect of stroke 11/11/2016  . Carotid artery stenosis 11/02/2016  . Right middle cerebral artery stroke ([place]) 10/19/2016  . Left arm weakness   . Diastolic dysfunction   . Hypertension with heart disease   . Tobacco abuse   . Acute blood loss anemia   . Cerebral infarction due to embolism of right middle cerebral artery ([place])   . Stroke (cerebrum) ([place]) 10/15/2016  . Perirectal abscess 09/26/2015  . Vitamin D deficiency 07/23/2015  . Osteopenia 02/16/2013  . Ankle fracture 01/19/2013  . Anemia 09/27/2011  . Uncontrolled type 2 diabetes mellitus with insulin therapy ([place]) 09/24/2011  . Hyperlipidemia 09/24/2011  . Hypertension 09/24/2011    Lovvorn, Annye Rusk MS, CCC-SLP 01/18/2017, 11:50 AM  [facility] [address] [place], [place], [address] Phone: [phone]   Fax:  [phone]   Name: Sarah [name] MRN: [id number] Date of Birth: October 06, 1945

## 2017-01-18 NOTE — Therapy (Signed)
[facility] [address] [place], [place], [address] Phone: [phone]   Fax:  [phone]  Occupational Therapy Treatment  Patient Details  Name: Sarah [name] MRN: [id number] Date of Birth: [birth year] Referring Provider (Historical): Dr. Letta Pate   Encounter Date: 01/18/2017  OT End of Session - 01/18/17 1305    Visit Number  10    Number of Visits  16    Date for OT Re-Evaluation  01/25/17    Authorization Type  [facility] will need PN and G code every 10th visit    Authorization Time Period  60 days    Authorization - Visit Number  10    Authorization - Number of Visits  20    OT Start Time  0932    OT Stop Time  4158    OT Time Calculation (min)  42 min    Activity Tolerance  Patient tolerated treatment well       Past Medical History:  Diagnosis Date  . Boils   . Glaucoma   . Heart murmur   . HTN (hypertension)   . Hx of adenomatous colonic polyps   . Hyperlipidemia   . Osteoporosis   . Pneumonia   . Stroke ([place])   . Type II or unspecified type diabetes mellitus without mention of complication, uncontrolled     Past Surgical History:  Procedure Laterality Date  . ABDOMINAL HYSTERECTOMY    . CARPAL TUNNEL RELEASE     right  . COLONOSCOPY  12-10-10   per Dr. Deatra Ina, clear, repeat in 7 yrs   . INCISION AND DRAINAGE PERIRECTAL ABSCESS N/A 09/26/2015   Procedure: IRRIGATION AND DEBRIDEMENT PERIRECTAL ABSCESS;  Surgeon: Mickeal Skinner, MD;  Location: [facility];  Service: General;  Laterality: N/A;  . KNEE ARTHROSCOPY     right  . LOOP RECORDER INSERTION N/A 10/19/2016   Procedure: LOOP RECORDER INSERTION;  Surgeon: Thompson Grayer, MD;  Location: [facility];  Service: Cardiovascular;  Laterality: N/A;  . ORIF ANKLE FRACTURE Right 03/24/2012   Procedure: OPEN REDUCTION INTERNAL FIXATION (ORIF) ANKLE FRACTURE;  Surgeon: Newt Minion, MD;  Location: [facility];  Service: Orthopedics;   Laterality: Right;  Open Reduction Internal Fixation Right Bimalleolar ankle fracture  . POLYPECTOMY    . TEE WITHOUT CARDIOVERSION N/A 10/18/2016   Procedure: TRANSESOPHAGEAL ECHOCARDIOGRAM (TEE);  Surgeon: Fay Records, MD;  Location: [facility];  Service: Cardiovascular;  Laterality: N/A;  . TONSILLECTOMY      There were no vitals filed for this visit.  Subjective Assessment - 01/18/17 0939    Subjective   I feel pretty good today    Pertinent History  Pt with R CVA    Patient Stated Goals  use my hand better - I want to be able to cook    Currently in Pain?  Yes    Pain Score  2     Pain Location  Back    Pain Orientation  Mid;Lower    Pain Descriptors / Indicators  Aching    Pain Type  Acute pain    Pain Onset  In the past 7 days    Pain Frequency  Intermittent    Aggravating Factors   certain movements, walking    Pain Relieving Factors  avoid those movements, nothing else really                   OT Treatments/Exercises (OP) - 01/18/17 0001  ADLs   ADL Comments  Assessed remaining goals - see goals for updates.  Pt has not met grip strength for coordination goals but has met several other goals.  After discussion pt to be placed on hold after next visit to allow pt to work on this at home via HEP and then return to therapy in 3 weeks fo assessment and possibly upgrade to HEP. Pt in agreement with plan      Hand Exercises   Other Hand Exercises  Gripper on #1, pt able to do activity with 3 rest breaks.  Also reviewed putty exercises at  home to ensure that pt was fully addressing grip strengthening - pt with improved performance after instruction and review.                 OT Short Term Goals - 01/18/17 1302      OT SHORT TERM GOAL #1   Title  Pt will be mod I with HEP for coordination and grip/pinch strength LUE - 12/28/2016    Status  Achieved      OT SHORT TERM GOAL #2   Title  Pt will demonstrate improved coordination in LUE as evidenced  by decreasing time on 9 hole peg test by at least 20 seconds to assist with functional tasks. (baseline= 1.04.47)    Status  Achieved 12/28/2016  2 trials 49.20 and then 30.20      OT SHORT TERM GOAL #3   Title  Pt will be min a for hot meal prep at ambulatory level and demonstrate good safety awareness    Status  Achieved      OT SHORT TERM GOAL #4   Title  Pt will demonstrate improved grip strength LUE by at least 5 pounds to assist with opening jars (baseline = 10 pounds)    Status  Achieved 12/23/2016  30 pounds        OT Long Term Goals - 01/18/17 1302      OT LONG TERM GOAL #1   Title  Pt will be mod I with upgraded HEP - 01/25/2017    Status  On-going      OT LONG TERM GOAL #2   Title  Pt will demonstrate improved grip strength to at least 38 pounds in LUE to assist with home mgmt tasks (baseline = 10 pounds)    Status  On-going 12/23/2016  30 pounds therefore met original goal.   revised to above goal.      OT LONG TERM GOAL #3   Title  Pt will demonstrate improved coordination as evidenced by decreasing time on 9 hole peg test by at least 25 seconds to reduce effort for buttoning, tying    Status  On-going 12/28/2016  2 trials 49.2 and 30.20 will monitor for consistent performance      OT LONG TERM GOAL #4   Title  Pt will demonstrate ability to type simple email with extra time.     Status  On-going      OT LONG TERM GOAL #5   Title  Pt will be mod I with hot meal prep.    Status  Achieved      OT LONG TERM GOAL #6   Title  Pt will be mod I with home mgmt tasks such as cleaning, laundry    Status  Achieved 12/28/2016 pt reports that she is doing parts of laundry with family's help, dishes and some vacuuming      OT LONG TERM  GOAL #7   Title  Pt will verbalize understanding of return to driving recommendations.     Status  Deferred Pt cleared by MD to drive      OT LONG TERM GOAL #8   Title  Pt will demonstrate improved LUE functional use as evidenced by improving  score on Box and Blocks by a least 4 blocks to assist with IADL's (baseline = 30)    Status  Achieved 01/18/2017  42 blocks            Plan - 01/18/17 1303    Clinical Impression Statement  Pt has met all but coordination and strength grip goals. Pt continues to progress toward other goals.     Rehab Potential  Good    OT Frequency  2x / week    OT Duration  8 weeks    OT Treatment/Interventions  Self-care/ADL training;Aquatic Therapy;Electrical Stimulation;Moist Heat;Ultrasound;Therapeutic exercise;Neuromuscular education;DME and/or AE instruction;Manual Therapy;Therapist, nutritional;Therapeutic activities;Cognitive remediation/compensation;Visual/perceptual remediation/compensation;Patient/family education;Balance training    Plan  functional mobility, balance, in hand manipulation, grip strength, functional use of L hand, home mgmt tasks, work toward typing    Consulted and Agree with Plan of Care  Patient       Patient will benefit from skilled therapeutic intervention in order to improve the following deficits and impairments:  Abnormal gait, Decreased activity tolerance, Decreased cognition, Decreased mobility, Decreased knowledge of use of DME, Decreased coordination, Decreased strength, Impaired UE functional use, Impaired vision/preception  Visit Diagnosis: Unsteadiness on feet  Muscle weakness (generalized)  Hemiplegia and hemiparesis following cerebral infarction affecting left non-dominant side (HCC)  Other symptoms and signs involving cognitive functions following cerebral infarction  Other lack of coordination  Visuospatial deficit    Problem List Patient Active Problem List   Diagnosis Date Noted  . Hemiparesis affecting left side as late effect of cerebrovascular accident (CVA) ([place]) 11/11/2016  . Gait disturbance, post-stroke 11/11/2016  . Dysarthria as late effect of stroke 11/11/2016  . Carotid artery stenosis 11/02/2016  . Right middle cerebral  artery stroke ([place]) 10/19/2016  . Left arm weakness   . Diastolic dysfunction   . Hypertension with heart disease   . Tobacco abuse   . Acute blood loss anemia   . Cerebral infarction due to embolism of right middle cerebral artery ([place])   . Stroke (cerebrum) ([place]) 10/15/2016  . Perirectal abscess 09/26/2015  . Vitamin D deficiency 07/23/2015  . Osteopenia 02/16/2013  . Ankle fracture 01/19/2013  . Anemia 09/27/2011  . Uncontrolled type 2 diabetes mellitus with insulin therapy ([place]) 09/24/2011  . Hyperlipidemia 09/24/2011  . Hypertension 09/24/2011   Occupational Therapy Progress Note  Dates of Reporting Period: 11/30/2017 to 01/18/2017  Objective Reports of Subjective Statement:  Pt with improved balance and functional use of LUE  Objective Measurements: see above  Goal Update: see above  Plan: see above  Reason Skilled Services are Required: grip strength, coordination, balance  Quay Burow, OTR/L 01/18/2017, 1:07 PM  [facility] [address] [place], [place], [address] Phone: [phone]   Fax:  [phone]  Name: SINDY MCCUNE MRN: [id number] Date of Birth: [birth year]

## 2017-01-18 NOTE — Progress Notes (Signed)
[facility] Summary Report / Loop Recorder 

## 2017-01-19 NOTE — Therapy (Signed)
[facility] [address] [place], [place], [address] Phone: [phone]   Fax:  [phone]  Physical Therapy Treatment  Patient Details  Name: Sarah [name] MRN: [id number] Date of Birth: 1945/11/28 Referring Provider: Alysia Penna MD   Encounter Date: 01/18/2017  PT End of Session - 01/18/17 1023    Visit Number  13    Number of Visits  27 recert 42/59    Date for PT Re-Evaluation  02/28/17    Authorization Type  [employer]HC Medicare    Authorization Time Period  11/08/16  to  01/07/17; 12/30/16 to 02/28/17    PT Start Time  1019    PT Stop Time  1100    PT Time Calculation (min)  41 min    Equipment Utilized During Treatment  Gait belt    Activity Tolerance  Patient tolerated treatment well    Behavior During Therapy  [facility] for tasks assessed/performed       Past Medical History:  Diagnosis Date  . Boils   . Glaucoma   . Heart murmur   . HTN (hypertension)   . Hx of adenomatous colonic polyps   . Hyperlipidemia   . Osteoporosis   . Pneumonia   . Stroke ([place])   . Type II or unspecified type diabetes mellitus without mention of complication, uncontrolled     Past Surgical History:  Procedure Laterality Date  . ABDOMINAL HYSTERECTOMY    . CARPAL TUNNEL RELEASE     right  . COLONOSCOPY  12-10-10   per Dr. Deatra Ina, clear, repeat in 7 yrs   . INCISION AND DRAINAGE PERIRECTAL ABSCESS N/A 09/26/2015   Procedure: IRRIGATION AND DEBRIDEMENT PERIRECTAL ABSCESS;  Surgeon: Mickeal Skinner, MD;  Location: [facility];  Service: General;  Laterality: N/A;  . KNEE ARTHROSCOPY     right  . LOOP RECORDER INSERTION N/A 10/19/2016   Procedure: LOOP RECORDER INSERTION;  Surgeon: Thompson Grayer, MD;  Location: [place] [facility];  Service: Cardiovascular;  Laterality: N/A;  . ORIF ANKLE FRACTURE Right 03/24/2012   Procedure: OPEN REDUCTION INTERNAL FIXATION (ORIF) ANKLE FRACTURE;  Surgeon: Newt Minion, MD;  Location: [facility];  Service: Orthopedics;  Laterality: Right;  Open Reduction Internal Fixation Right Bimalleolar ankle fracture  . POLYPECTOMY    . TEE WITHOUT CARDIOVERSION N/A 10/18/2016   Procedure: TRANSESOPHAGEAL ECHOCARDIOGRAM (TEE);  Surgeon: Fay Records, MD;  Location: [facility] ENDOSCOPY;  Service: Cardiovascular;  Laterality: N/A;  . TONSILLECTOMY      There were no vitals filed for this visit.  Subjective Assessment - 01/18/17 1019    Subjective  No new complaints. No falls or pain to report.     Patient is accompained by:  Family member    Pertinent History  HTN, HLD, DM, neurop, tobacco, COPD, OP (lives w/ mom; sister came to stay)    Limitations  Standing;Walking    How long can you stand comfortably?  15 min    Patient Stated Goals  to improve the use of her left hand and walk without a walker    Currently in Pain?  Yes    Pain Score  2     Pain Location  Back    Pain Orientation  Mid;Lower    Pain Descriptors / Indicators  Aching;Discomfort;Nagging    Pain Type  Acute pain    Pain Onset  1 to 4 weeks ago    Pain Frequency  Intermittent    Aggravating Factors  certain movements, increased walking    Pain Relieving Factors  rest and avoiding movements that irritate back          [facility] Adult PT Treatment/Exercise - 01/18/17 1024      Ambulation/Gait   Ambulation/Gait  Yes    Ambulation/Gait Assistance  4: Min guard;4: Min assist    Ambulation/Gait Assistance Details  one episode of toe catching on pavement needing min assist to prevent fall forward, otherwise min guard assist with cues for incr step length and heel toe step progression.     Ambulation Distance (Feet)  500 Feet    Assistive device  None    Gait Pattern  Step-through pattern;Decreased stride length;Poor foot clearance - left;Decreased dorsiflexion - left;Decreased arm swing - right;Decreased arm swing - left    Ambulation Surface  Level;Indoor;Unlevel;Outdoor;Paved;Gravel;Grass    Gait Comments  along ~50 foot  hallway: fwd gait with fast pace with head movements left<>fwd<>right, then up<>fwd<>down. 4 laps each with min guard to min assist for balance with cues on posture, weight shifting and step length.        Neuro Re-ed    Neuro Re-ed Details   neuro re-ed to address balance/multi-tasking/coordination: forward walking while tossing ball up/catching it while naming animals A-Z with max cues to keep tossing ball while thinking, cues to animal names ~50% and min guard to min assist for balance due to occasional toe scuffing.           Balance Exercises - 01/18/17 1047      Balance Exercises: Standing   Standing Eyes Closed  Narrow base of support (BOS);Foam/compliant surface;Other reps (comment);30 secs;Limitations    Balance Beam  standing across blue foam beam: alternating fwd stepping to floor/back onto beam, then alternating bwd stepping to floor/back onto beam. 10 reps each bil legs with no UE support, min assist for balance and cues on posture/weight shifitng/incr step length and incr step height to clear foam beam on return.       Balance Exercises: Standing   Standing Eyes Closed Limitations  standing across blue foam beam: EC no head movements, progressing to EC head movements left<>right, then up<>down. no UE support with min to mod assist for balance. cues for posture and weight shifting to assist with balance recovery.           PT Short Term Goals - 12/31/16 1750      PT SHORT TERM GOAL #1   Title  Assess gait velocity and set STG and LTG as appropriate. (Target all STGs 12/08/16)    Baseline  11/16/16: met today with PT to set goals    Status  Achieved      PT SHORT TERM GOAL #2   Title  Patient will be independent with HEP for balance, strength, and gait training.     Time  4    Period  Weeks    Status  Achieved      PT SHORT TERM GOAL #3   Title  Patient will ambulate 250 ft on level/unlevel exterior surfaces with LRAD modified independent.     Baseline  12/07/16  250  ft x 2 level indoor surfaces due to weather    Time  4    Period  Weeks    Status  Unable to assess      PT SHORT TERM GOAL #4   Title  Patient will improve TUG to <15 seconds with LRAD.     Baseline  12/07/16 11.06 sec no device  Time  8    Period  Weeks    Status  Achieved      PT SHORT TERM GOAL #5   Title  Patient will complete FGA with goal set as appropriate. (Target date 01/20/17)    Time  3    Period  Weeks    Status  New    Target Date  01/20/17        PT Long Term Goals - 12/30/16 0948      PT LONG TERM GOAL #1   Title  Patient will be independent with updated HEP for balance, strength and ambulation. (Target date all LTGs 01/07/17)    Time  8    Period  Weeks    Status  Achieved      PT LONG TERM GOAL #2   Title  Patient will verbalize her plan for continued community-based activity plan to assist with reducing stroke risk factors.    Baseline  12/07/16 discussed joining gym vs walking at mall or other large stores    Time  8    Period  Weeks    Status  Achieved      PT LONG TERM GOAL #3   Title  Patient will ambulate 1000 ft on outside surfaces with LRAD at modified independent level; 12/07/16 goal updated to no AD    Baseline  Simulated over mats due to weather with minguard assist    Time  8    Period  Weeks    Status  Not Met      PT LONG TERM GOAL #4   Title  Patient will improve TUG to <13.5 seconds with LRAD.     Baseline  12/07/16 11.06 sec    Time  8    Period  Weeks    Status  Achieved      PT LONG TERM GOAL #5   Title  Patient will ambulate up/down 4 steps with light use of rail and step-over-step sequencing.     Time  4    Period  Weeks    Status  Achieved      Additional Long Term Goals   Additional Long Term Goals  Yes      PT LONG TERM GOAL #6   Title  Patient will ambulate with head turns over level, indoor surface while carrying two simulated bags of groceries of different weights x 200 ft modified independent. (Target date  for LTGs 02/10/17)    Time  6    Period  Weeks    Status  New    Target Date  02/10/17      PT LONG TERM GOAL #7   Title  Pateint will ambulate 500 ft over unlevel outdoor surfaces (grass, gravel, curbs, inclines) with no assistive device modified independent. (distance reduced due to winter season)    Time  6    Period  Weeks    Status  New      PT LONG TERM GOAL #8   Title  Patient will carry full laundry basket up/down 12 steps modified independent.     Time  6    Period  Weeks    Status  New      PT LONG TERM GOAL  #9   TITLE  pateint will be independent with updated/final HEP and have a plan for continued community-based fitness program upon discharge from PT.     Time  6    Period  Weeks    Status  New            Plan - 01/18/17 1023    Clinical Impression Statement  Today's skilled session continued to focus on high level balance and multi-tasking with assistance needed for balance with challenges. Pt needs cues to attend to task's when engaged in dual taking activities. Pt is making progress slowly and should benefit from continued PT to progress toward unmet goals.     Rehab Potential  Good    PT Frequency  2x / week updated for recert 01/65/53    PT Duration  6 weeks    PT Treatment/Interventions  ADLs/Self Care Home Management;DME Instruction;Gait training;Stair training;Functional mobility training;Orthotic Fit/Training;Patient/family education;Neuromuscular re-education;Balance training;Therapeutic exercise;Therapeutic activities;Passive range of motion;Cognitive remediation    PT Next Visit Plan  continue to work on high level balance with emphasis on complaint surfaces/vision removed, and add to corner HEP as needed    Consulted and Agree with Plan of Care  Patient       Patient will benefit from skilled therapeutic intervention in order to improve the following deficits and impairments:  Decreased balance, Decreased knowledge of use of DME, Decreased  mobility, Decreased strength, Difficulty walking, Impaired UE functional use, Impaired vision/preception, Obesity  Visit Diagnosis: Unsteadiness on feet  Muscle weakness (generalized)  Other abnormalities of gait and mobility     Problem List Patient Active Problem List   Diagnosis Date Noted  . Hemiparesis affecting left side as late effect of cerebrovascular accident (CVA) ([place]) 11/11/2016  . Gait disturbance, post-stroke 11/11/2016  . Dysarthria as late effect of stroke 11/11/2016  . Carotid artery stenosis 11/02/2016  . Right middle cerebral artery stroke ([place]) 10/19/2016  . Left arm weakness   . Diastolic dysfunction   . Hypertension with heart disease   . Tobacco abuse   . Acute blood loss anemia   . Cerebral infarction due to embolism of right middle cerebral artery ([place])   . Stroke (cerebrum) ([place]) 10/15/2016  . Perirectal abscess 09/26/2015  . Vitamin D deficiency 07/23/2015  . Osteopenia 02/16/2013  . Ankle fracture 01/19/2013  . Anemia 09/27/2011  . Uncontrolled type 2 diabetes mellitus with insulin therapy ([place]) 09/24/2011  . Hyperlipidemia 09/24/2011  . Hypertension 09/24/2011    Willow Ora, PTA, [facility] [address] [place], [place] [address] [phone] 01/19/17, 11:43 AM   Name: Sarah [name] MRN: [id number] Date of Birth: 07-05-1945

## 2017-01-20 ENCOUNTER — Encounter: Payer: Medicare Other | Admitting: Occupational Therapy

## 2017-01-20 ENCOUNTER — Encounter: Payer: Self-pay | Admitting: Physical Therapy

## 2017-01-20 ENCOUNTER — Ambulatory Visit: Payer: Medicare Other | Admitting: Speech Pathology

## 2017-01-20 ENCOUNTER — Other Ambulatory Visit: Payer: Self-pay

## 2017-01-20 ENCOUNTER — Ambulatory Visit: Payer: Medicare Other | Admitting: Physical Therapy

## 2017-01-20 DIAGNOSIS — R471 Dysarthria and anarthria: Secondary | ICD-10-CM

## 2017-01-20 DIAGNOSIS — I69354 Hemiplegia and hemiparesis following cerebral infarction affecting left non-dominant side: Secondary | ICD-10-CM | POA: Diagnosis not present

## 2017-01-20 DIAGNOSIS — M6281 Muscle weakness (generalized): Secondary | ICD-10-CM

## 2017-01-20 DIAGNOSIS — R2681 Unsteadiness on feet: Secondary | ICD-10-CM | POA: Diagnosis not present

## 2017-01-20 DIAGNOSIS — I69318 Other symptoms and signs involving cognitive functions following cerebral infarction: Secondary | ICD-10-CM | POA: Diagnosis not present

## 2017-01-20 DIAGNOSIS — R2689 Other abnormalities of gait and mobility: Secondary | ICD-10-CM | POA: Diagnosis not present

## 2017-01-20 DIAGNOSIS — R278 Other lack of coordination: Secondary | ICD-10-CM | POA: Diagnosis not present

## 2017-01-20 NOTE — Therapy (Signed)
[facility] [address] [place], [place], [address] Phone: [phone]   Fax:  [phone]  Physical Therapy Treatment  Patient Details  Name: [name] MRN: [id number] Date of Birth: 1945-04-13 Referring Provider: Alysia Penna MD   Encounter Date: 01/20/2017  PT End of Session - 01/20/17 0938    Visit Number  14    Number of Visits  27 recert 89/16    Date for PT Re-Evaluation  02/28/17    Authorization Type  [employer]HC Medicare    Authorization Time Period  11/08/16  to  01/07/17; 12/30/16 to 02/28/17    PT Start Time  0933    PT Stop Time  1012 pt needed to use restroom, left early    PT Time Calculation (min)  39 min    Equipment Utilized During Treatment  Gait belt    Activity Tolerance  Patient tolerated treatment well    Behavior During Therapy  [facility] for tasks assessed/performed       Past Medical History:  Diagnosis Date  . Boils   . Glaucoma   . Heart murmur   . HTN (hypertension)   . Hx of adenomatous colonic polyps   . Hyperlipidemia   . Osteoporosis   . Pneumonia   . Stroke ([place])   . Type II or unspecified type diabetes mellitus without mention of complication, uncontrolled     Past Surgical History:  Procedure Laterality Date  . ABDOMINAL HYSTERECTOMY    . CARPAL TUNNEL RELEASE     right  . COLONOSCOPY  12-10-10   per Dr. Deatra Ina, clear, repeat in 7 yrs   . INCISION AND DRAINAGE PERIRECTAL ABSCESS N/A 09/26/2015   Procedure: IRRIGATION AND DEBRIDEMENT PERIRECTAL ABSCESS;  Surgeon: Mickeal Skinner, MD;  Location: [facility];  Service: General;  Laterality: N/A;  . KNEE ARTHROSCOPY     right  . LOOP RECORDER INSERTION N/A 10/19/2016   Procedure: LOOP RECORDER INSERTION;  Surgeon: Thompson Grayer, MD;  Location: [place] [facility];  Service: Cardiovascular;  Laterality: N/A;  . ORIF ANKLE FRACTURE Right 03/24/2012   Procedure: OPEN REDUCTION INTERNAL FIXATION (ORIF) ANKLE FRACTURE;   Surgeon: Newt Minion, MD;  Location: [facility];  Service: Orthopedics;  Laterality: Right;  Open Reduction Internal Fixation Right Bimalleolar ankle fracture  . POLYPECTOMY    . TEE WITHOUT CARDIOVERSION N/A 10/18/2016   Procedure: TRANSESOPHAGEAL ECHOCARDIOGRAM (TEE);  Surgeon: Fay Records, MD;  Location: [facility] ENDOSCOPY;  Service: Cardiovascular;  Laterality: N/A;  . TONSILLECTOMY      There were no vitals filed for this visit.  Subjective Assessment - 01/20/17 0938    Subjective  No new complaints. No falls or pain to report.     Pertinent History  HTN, HLD, DM, neurop, tobacco, COPD, OP (lives w/ mom; sister came to stay)    Limitations  Standing;Walking    How long can you stand comfortably?  15 min    Patient Stated Goals  to improve the use of her left hand and walk without a walker    Currently in Pain?  No/denies    Pain Score  0-No pain           [facility] Adult PT Treatment/Exercise - 01/20/17 0939      High Level Balance   High Level Balance Activities  Negotiating over obstacles;Tandem walking;Head turns head turns left<>right, then up<>down    High Level Balance Comments  on both red mats with no UE support:  toe walking fwd/bwd, heel walking fwd/bwd, tandem walking fwd/bwd, tandem on toes fwd/bwd,- all for 3 laps each with min guard to min assist for balance with cues on posture and ex form. forward stepping over hurdles of varied heights x4 laps with min assist and cues for increased step height/lenght to clear hurdles using reciprocal stepping; with cones along both edges of mats: lateral tapping to cone with tandem gait down middle x 4 laps with min to mod assist and cues for weight shifitng/step length to assist with balance; fwd/bwd gait on mats with head turns left<>right, then up<>down x 3 laps each. mild dizziness with up<>down had movements that resolved with rest breaks between laps.           PT Short Term Goals - 12/31/16 1750      PT SHORT TERM GOAL #1   Title   Assess gait velocity and set STG and LTG as appropriate. (Target all STGs 12/08/16)    Baseline  11/16/16: met today with PT to set goals    Status  Achieved      PT SHORT TERM GOAL #2   Title  Patient will be independent with HEP for balance, strength, and gait training.     Time  4    Period  Weeks    Status  Achieved      PT SHORT TERM GOAL #3   Title  Patient will ambulate 250 ft on level/unlevel exterior surfaces with LRAD modified independent.     Baseline  12/07/16  250 ft x 2 level indoor surfaces due to weather    Time  4    Period  Weeks    Status  Unable to assess      PT SHORT TERM GOAL #4   Title  Patient will improve TUG to <15 seconds with LRAD.     Baseline  12/07/16 11.06 sec no device    Time  8    Period  Weeks    Status  Achieved      PT SHORT TERM GOAL #5   Title  Patient will complete FGA with goal set as appropriate. (Target date 01/20/17)    Time  3    Period  Weeks    Status  New    Target Date  01/20/17        PT Long Term Goals - 12/30/16 0948      PT LONG TERM GOAL #1   Title  Patient will be independent with updated HEP for balance, strength and ambulation. (Target date all LTGs 01/07/17)    Time  8    Period  Weeks    Status  Achieved      PT LONG TERM GOAL #2   Title  Patient will verbalize her plan for continued community-based activity plan to assist with reducing stroke risk factors.    Baseline  12/07/16 discussed joining gym vs walking at mall or other large stores    Time  8    Period  Weeks    Status  Achieved      PT LONG TERM GOAL #3   Title  Patient will ambulate 1000 ft on outside surfaces with LRAD at modified independent level; 12/07/16 goal updated to no AD    Baseline  Simulated over mats due to weather with minguard assist    Time  8    Period  Weeks    Status  Not Met      PT LONG TERM  GOAL #4   Title  Patient will improve TUG to <13.5 seconds with LRAD.     Baseline  12/07/16 11.06 sec    Time  8    Period   Weeks    Status  Achieved      PT LONG TERM GOAL #5   Title  Patient will ambulate up/down 4 steps with light use of rail and step-over-step sequencing.     Time  4    Period  Weeks    Status  Achieved      Additional Long Term Goals   Additional Long Term Goals  Yes      PT LONG TERM GOAL #6   Title  Patient will ambulate with head turns over level, indoor surface while carrying two simulated bags of groceries of different weights x 200 ft modified independent. (Target date for LTGs 02/10/17)    Time  6    Period  Weeks    Status  New    Target Date  02/10/17      PT LONG TERM GOAL #7   Title  Pateint will ambulate 500 ft over unlevel outdoor surfaces (grass, gravel, curbs, inclines) with no assistive device modified independent. (distance reduced due to winter season)    Time  6    Period  Weeks    Status  New      PT LONG TERM GOAL #8   Title  Patient will carry full laundry basket up/down 12 steps modified independent.     Time  6    Period  Weeks    Status  New      PT LONG TERM GOAL  #9   TITLE  pateint will be independent with updated/final HEP and have a plan for continued community-based fitness program upon discharge from PT.     Time  6    Period  Weeks    Status  New        Plan - 01/20/17 1914    Clinical Impression Statement  Today's session focused on high level balance on compliant surfaces with up to min assist needed for balance. Mild dizziness reported with up/down head movements only that resolved quickly. Pt is making steady progress toward goals and should benefit from continued PT to progress toward unmet goals.     Rehab Potential  Good    PT Frequency  2x / week updated for recert 78/29/56    PT Duration  6 weeks    PT Treatment/Interventions  ADLs/Self Care Home Management;DME Instruction;Gait training;Stair training;Functional mobility training;Orthotic Fit/Training;Patient/family education;Neuromuscular re-education;Balance training;Therapeutic  exercise;Therapeutic activities;Passive range of motion;Cognitive remediation    PT Next Visit Plan  continue to work on high level balance with emphasis on complaint surfaces/vision removed, and add to corner HEP as needed    Consulted and Agree with Plan of Care  Patient       Patient will benefit from skilled therapeutic intervention in order to improve the following deficits and impairments:  Decreased balance, Decreased knowledge of use of DME, Decreased mobility, Decreased strength, Difficulty walking, Impaired UE functional use, Impaired vision/preception, Obesity  Visit Diagnosis: Unsteadiness on feet  Muscle weakness (generalized)  Other abnormalities of gait and mobility     Problem List Patient Active Problem List   Diagnosis Date Noted  . Hemiparesis affecting left side as late effect of cerebrovascular accident (CVA) ([place]) 11/11/2016  . Gait disturbance, post-stroke 11/11/2016  . Dysarthria as late effect of stroke 11/11/2016  . Carotid  artery stenosis 11/02/2016  . Right middle cerebral artery stroke ([place]) 10/19/2016  . Left arm weakness   . Diastolic dysfunction   . Hypertension with heart disease   . Tobacco abuse   . Acute blood loss anemia   . Cerebral infarction due to embolism of right middle cerebral artery ([place])   . Stroke (cerebrum) ([place]) 10/15/2016  . Perirectal abscess 09/26/2015  . Vitamin D deficiency 07/23/2015  . Osteopenia 02/16/2013  . Ankle fracture 01/19/2013  . Anemia 09/27/2011  . Uncontrolled type 2 diabetes mellitus with insulin therapy ([place]) 09/24/2011  . Hyperlipidemia 09/24/2011  . Hypertension 09/24/2011    Willow Ora, PTA, [facility] [address], [place], [place] [address] [phone] 01/20/17, 11:58 AM   Name: Sarah [name] MRN: [id number] Date of Birth: 11-25-1945

## 2017-01-20 NOTE — Patient Instructions (Signed)
Abdominal Breathing exercises: Practice every day, 20 minutes x 2 a day  1. Lie flat on your back with a hand on your belly. Breathe and relax (don't try to control, just notice) for 5 minutes. Feel how your belly moves up with inhalation and down as you exhale. Try this for 5 minutes.  2. While you lie down, try adding some words. Counting, days of the week, months of year. One word per breath. Feel your belly rise, and at the top of your breath, let your exhale push the words out. Don't pause, don't hold your breath. Let it flow.  3. Sit up and with a hand on your belly and a hand on your chest, focus on your relaxed breathing. Breathe in, belly out. Breathe out, belly in. Keep it going for 5 minutes. If it falls apart, get back on track. You can lie down again if that helps you get the rhythm. Practice breathing in and out in front of a mirror, watching your belly.   4. Add words as you did when you were lying down. As you exhale, allow your exhale to push the words out. Don't pause, don't hold your breath. If you get off track, go back to just breathing.

## 2017-01-20 NOTE — Therapy (Signed)
[facility] [address] [place], [place], [address] Phone: [phone]   Fax:  [phone]  Speech Language Pathology Treatment  Patient Details  Name: Sarah [name] MRN: [id number] Date of Birth: 03-08-1945 Referring Provider: Alysia Penna, MD   Encounter Date: 01/20/2017  End of Session - 01/20/17 1253    Visit Number  5    Number of Visits  17    Date for SLP Re-Evaluation  03/04/17    SLP Start Time  0845    SLP Stop Time   0928    SLP Time Calculation (min)  43 min    Activity Tolerance  Patient tolerated treatment well       Past Medical History:  Diagnosis Date  . Boils   . Glaucoma   . Heart murmur   . HTN (hypertension)   . Hx of adenomatous colonic polyps   . Hyperlipidemia   . Osteoporosis   . Pneumonia   . Stroke ([place])   . Type II or unspecified type diabetes mellitus without mention of complication, uncontrolled     Past Surgical History:  Procedure Laterality Date  . ABDOMINAL HYSTERECTOMY    . CARPAL TUNNEL RELEASE     right  . COLONOSCOPY  12-10-10   per Dr. Deatra Ina, clear, repeat in 7 yrs   . INCISION AND DRAINAGE PERIRECTAL ABSCESS N/A 09/26/2015   Procedure: IRRIGATION AND DEBRIDEMENT PERIRECTAL ABSCESS;  Surgeon: Mickeal Skinner, MD;  Location: [facility];  Service: General;  Laterality: N/A;  . KNEE ARTHROSCOPY     right  . LOOP RECORDER INSERTION N/A 10/19/2016   Procedure: LOOP RECORDER INSERTION;  Surgeon: Thompson Grayer, MD;  Location: [facility];  Service: Cardiovascular;  Laterality: N/A;  . ORIF ANKLE FRACTURE Right 03/24/2012   Procedure: OPEN REDUCTION INTERNAL FIXATION (ORIF) ANKLE FRACTURE;  Surgeon: Newt Minion, MD;  Location: [facility];  Service: Orthopedics;  Laterality: Right;  Open Reduction Internal Fixation Right Bimalleolar ankle fracture  . POLYPECTOMY    . TEE WITHOUT CARDIOVERSION N/A 10/18/2016   Procedure: TRANSESOPHAGEAL ECHOCARDIOGRAM (TEE);   Surgeon: Fay Records, MD;  Location: [facility];  Service: Cardiovascular;  Laterality: N/A;  . TONSILLECTOMY      There were no vitals filed for this visit.  Subjective Assessment - 01/20/17 0847    Subjective  "She gave me a list of long words to practice, and my breathing."    Currently in Pain?  No/denies            ADULT SLP TREATMENT - 01/20/17 0847      General Information   Behavior/Cognition  Alert;Cooperative;Pleasant mood    Patient Positioning  Upright in chair      Treatment Provided   Treatment provided  Cognitive-Linquistic      Pain Assessment   Pain Assessment  No/denies pain      Cognitive-Linquistic Treatment   Treatment focused on  Dysarthria    Skilled Treatment  SLP required mod A for abdominal breathing in isolation initially, with SLP fading cues to rare min A for 70% accuracy over 5 minute period. When adding phonation and automatic speech tasks, accuracy with AB fell to 50%, and pt required usual mod A for coordinating breath/phonation. Modified activity to allow pt to lie down on mat. In this position, pt with good self monitoring of AB (95%), and pt was able to coordinate breathing with automatic speech tasks with 75% accuracy, occasional min A. From this  position, progressed to upright AB with automatic speech tasks, occasional min-mod A for breath/speech coordination. Pt given handout with instructions to practice AB for 20 minutes per day (see pt instructions).       Assessment / Recommendations / Plan   Plan  Continue with current plan of care      Progression Toward Goals   Progression toward goals  Progressing toward goals       SLP Education - 01/20/17 1253    Education provided  Yes    Education Details  abdominal breathing: practice 20 minutes 2x per day    Person(s) Educated  Patient    Methods  Explanation;Demonstration;Tactile cues;Verbal cues;Handout    Comprehension  Verbalized understanding;Returned demonstration;Need  further instruction;Verbal cues required;Tactile cues required       SLP Short Term Goals - 01/20/17 1256      SLP SHORT TERM GOAL #1   Title  pt will perform HEP with rare min A over three sessions    Time  3    Period  Weeks    Status  On-going      SLP SHORT TERM GOAL #2   Title  pt will perform respiratory muscle training exercises with modified independence over two sessions    Time  3    Period  Weeks    Status  On-going      SLP SHORT TERM GOAL #3   Title  pt will demo compensatory strategies for intelligibility 70% of the time in 10 minutes simple conversation    Time  3    Period  Weeks    Status  On-going      SLP SHORT TERM GOAL #4   Title  pt will demo abdominal breathing 80% of the time in sentence responses x3 sessions    Time  3    Period  Weeks    Status  On-going       SLP Long Term Goals - 01/20/17 1256      SLP LONG TERM GOAL #1   Title  pt will use compensatory strategies for intelligibilty with modified independence in 10 minutes mod complex conversation x2 sessions    Time  7    Period  Weeks    Status  On-going      SLP LONG TERM GOAL #2   Title  pt will demo respiratory muscle training exercises with independence x2 sessions    Time  7    Period  Weeks    Status  On-going      SLP LONG TERM GOAL #3   Title  pt will perform HEP for dysarthria with modified independence x3 sessions    Time  7    Period  Weeks    Status  On-going      SLP LONG TERM GOAL #4   Title  pt will demo abdominal breathing functionally in 10 mintues mod complex conversation     Time  7    Period  Weeks    Status  On-going       Plan - 01/20/17 1254    Clinical Impression Statement  Pt continues with mild dysarthria in conversation. Pt utilized abdominal breathing in structured tasks with occasional min A; benefits from biofeedback (visual, tactile) to improve awareness of AB. Continue skilled ST to maximize intellgibiltiy    Speech Therapy Frequency  2x / week     Treatment/Interventions  Oral motor exercises;SLP instruction and feedback;Environmental controls;Cueing hierarchy;Compensatory strategies;Internal/external aids;Patient/family education  Potential to Achieve Goals  Good    Consulted and Agree with Plan of Care  Patient       Patient will benefit from skilled therapeutic intervention in order to improve the following deficits and impairments:   Dysarthria and anarthria    Problem List Patient Active Problem List   Diagnosis Date Noted  . Hemiparesis affecting left side as late effect of cerebrovascular accident (CVA) ([place]) 11/11/2016  . Gait disturbance, post-stroke 11/11/2016  . Dysarthria as late effect of stroke 11/11/2016  . Carotid artery stenosis 11/02/2016  . Right middle cerebral artery stroke ([place]) 10/19/2016  . Left arm weakness   . Diastolic dysfunction   . Hypertension with heart disease   . Tobacco abuse   . Acute blood loss anemia   . Cerebral infarction due to embolism of right middle cerebral artery ([place])   . Stroke (cerebrum) ([place]) 10/15/2016  . Perirectal abscess 09/26/2015  . Vitamin D deficiency 07/23/2015  . Osteopenia 02/16/2013  . Ankle fracture 01/19/2013  . Anemia 09/27/2011  . Uncontrolled type 2 diabetes mellitus with insulin therapy ([place]) 09/24/2011  . Hyperlipidemia 09/24/2011  . Hypertension 09/24/2011   Deneise Lever, [place], [place] 01/20/2017, 12:57 PM  [facility] [address] [place], [place], [address] Phone: [phone]   Fax:  [phone]   Name: Sarah [name] MRN: [id number] Date of Birth: [birth year]

## 2017-01-21 ENCOUNTER — Encounter: Payer: Medicare Other | Attending: Physical Medicine & Rehabilitation

## 2017-01-21 ENCOUNTER — Encounter: Payer: Self-pay | Admitting: Physical Medicine & Rehabilitation

## 2017-01-21 ENCOUNTER — Ambulatory Visit (HOSPITAL_BASED_OUTPATIENT_CLINIC_OR_DEPARTMENT_OTHER): Payer: Medicare Other | Admitting: Physical Medicine & Rehabilitation

## 2017-01-21 ENCOUNTER — Other Ambulatory Visit: Payer: Self-pay

## 2017-01-21 VITALS — BP 153/93 | HR 78

## 2017-01-21 DIAGNOSIS — I69322 Dysarthria following cerebral infarction: Secondary | ICD-10-CM | POA: Diagnosis not present

## 2017-01-21 DIAGNOSIS — I69354 Hemiplegia and hemiparesis following cerebral infarction affecting left non-dominant side: Secondary | ICD-10-CM | POA: Diagnosis not present

## 2017-01-21 DIAGNOSIS — E785 Hyperlipidemia, unspecified: Secondary | ICD-10-CM | POA: Diagnosis not present

## 2017-01-21 DIAGNOSIS — R269 Unspecified abnormalities of gait and mobility: Secondary | ICD-10-CM | POA: Insufficient documentation

## 2017-01-21 DIAGNOSIS — Z720 Tobacco use: Secondary | ICD-10-CM | POA: Insufficient documentation

## 2017-01-21 DIAGNOSIS — E119 Type 2 diabetes mellitus without complications: Secondary | ICD-10-CM | POA: Insufficient documentation

## 2017-01-21 DIAGNOSIS — I10 Essential (primary) hypertension: Secondary | ICD-10-CM | POA: Insufficient documentation

## 2017-01-21 DIAGNOSIS — I69398 Other sequelae of cerebral infarction: Secondary | ICD-10-CM

## 2017-01-21 NOTE — Progress Notes (Signed)
Subjective:    Patient ID: Sarah [name], female    DOB: [birth year], 72 y.o.   MRN: [id number] 72 year old right-handed female with history of hypertension, hyperlipidemia, diabetes mellitus, tobacco abuse, who presented on October 15, 2016, with left-sided weakness and facial droop.  She lives with her elderly mother independent prior to admission.  Cranial CT scan completed showing right frontal infarction. MRI, MRA showed no acute hemorrhage or mass effect.  Multifocal ischemia in right MCA distribution.  The patient did not receive tPA. Echocardiogram with ejection fraction of 13%, grade 2 diastolic dysfunction.  Carotid Dopplers with 40-59% right ICA stenosis.  CT angiogram of the neck showed no emboli.  Neurology consulted, placed on aspirin and Plavix therapy.  TEE showed no PFO without masses.  A loop recorder was placed.  HPI   Nocturia urgency and freq (2X PER NIGHT) ASKING ABOUT WHETHER SHE CAN HAVE SOME DENTAL WORK -on ASA and Plavix, sees Dr Erlinda Hong Per D/C summary is to cont ASA and Plavix x 3 mo then Plavix alone.  Per dates should be 01/19/17 Low back stiffness not very painful At OP PT, had increased slurred speech and PT rec ED visit BP 148/66 , neuro exam unchanged  CT no new lesions, prior FP infarct  Mild L sided back pain No cigs since OCt 6! Pain Inventory Average Pain 1 Pain Right Now 2 My pain is dull  In the last 24 hours, has pain interfered with the following? General activity 1 Relation with others 0 Enjoyment of life 1 What TIME of day is your pain at its worst? morning Sleep (in general) Fair  Pain is worse with: bending Pain improves with: rest Relief from Meds: 1  Mobility walk without assistance how many minutes can you walk? 5-10 ability to climb steps?  yes do you drive?  yes Do you have any goals in this area?  yes  Function retired  Neuro/Psych bladder control problems  Prior Studies Any changes since last visit?   no  Physicians involved in your care Any changes since last visit?  no   Family History  Problem Relation Age of Onset  . Diabetes Mother   . Hypertension Mother   . Stroke Father   . Stroke Maternal Uncle   . Stroke Paternal Uncle   . Colon cancer Neg Hx   . Esophageal cancer Neg Hx   . Rectal cancer Neg Hx   . Stomach cancer Neg Hx    Social History   Socioeconomic History  . Marital status: Single    Spouse name: None  . Number of children: None  . Years of education: None  . Highest education level: None  Social Needs  . Financial resource strain: None  . Food insecurity - worry: None  . Food insecurity - inability: None  . Transportation needs - medical: None  . Transportation needs - non-medical: None  Occupational History  . None  Tobacco Use  . Smoking status: Former Smoker    Types: Cigarettes    Last attempt to quit: 10/15/2016    Years since quitting: 0.2  . Smokeless tobacco: Never Used  . Tobacco comment: smokes occ.   Substance and Sexual Activity  . Alcohol use: Yes    Alcohol/week: 0.0 oz    Comment: occ  . Drug use: No  . Sexual activity: None  Other Topics Concern  . None  Social History Narrative  . None   Past Surgical History:  Procedure Laterality Date  .  ABDOMINAL HYSTERECTOMY    . CARPAL TUNNEL RELEASE     right  . COLONOSCOPY  12-10-10   per Dr. Deatra Ina, clear, repeat in 7 yrs   . INCISION AND DRAINAGE PERIRECTAL ABSCESS N/A 09/26/2015   Procedure: IRRIGATION AND DEBRIDEMENT PERIRECTAL ABSCESS;  Surgeon: Mickeal Skinner, MD;  Location: [facility];  Service: General;  Laterality: N/A;  . KNEE ARTHROSCOPY     right  . LOOP RECORDER INSERTION N/A 10/19/2016   Procedure: LOOP RECORDER INSERTION;  Surgeon: Thompson Grayer, MD;  Location: [facility];  Service: Cardiovascular;  Laterality: N/A;  . ORIF ANKLE FRACTURE Right 03/24/2012   Procedure: OPEN REDUCTION INTERNAL FIXATION (ORIF) ANKLE FRACTURE;  Surgeon: Newt Minion, MD;   Location: [facility];  Service: Orthopedics;  Laterality: Right;  Open Reduction Internal Fixation Right Bimalleolar ankle fracture  . POLYPECTOMY    . TEE WITHOUT CARDIOVERSION N/A 10/18/2016   Procedure: TRANSESOPHAGEAL ECHOCARDIOGRAM (TEE);  Surgeon: Fay Records, MD;  Location: [facility];  Service: Cardiovascular;  Laterality: N/A;  . TONSILLECTOMY     Past Medical History:  Diagnosis Date  . Boils   . Glaucoma   . Heart murmur   . HTN (hypertension)   . Hx of adenomatous colonic polyps   . Hyperlipidemia   . Osteoporosis   . Pneumonia   . Stroke ([place])   . Type II or unspecified type diabetes mellitus without mention of complication, uncontrolled    There were no vitals taken for this visit.  Opioid Risk Score:   Fall Risk Score:  `1  Depression screen PHQ 2/9  Depression screen [facility] 2/9 01/21/2017 11/11/2016 09/06/2014 03/27/2013 01/19/2013  Decreased Interest 0 0 0 0 0  Down, Depressed, Hopeless 0 1 0 0 0  PHQ - 2 Score 0 1 0 0 0  Altered sleeping - 0 - - -  Tired, decreased energy - 1 - - -  Change in appetite - 0 - - -  Feeling bad or failure about yourself  - 0 - - -  Trouble concentrating - 1 - - -  Moving slowly or fidgety/restless - 1 - - -  Suicidal thoughts - 0 - - -  PHQ-9 Score - 4 - - -  Difficult doing work/chores - Somewhat difficult - - -      Review of Systems  Constitutional: Negative.   HENT: Negative.   Eyes: Negative.   Respiratory: Negative.   Cardiovascular: Negative.   Gastrointestinal: Negative.   Endocrine: Negative.   Genitourinary: Negative.   Musculoskeletal: Negative.   Skin: Negative.   Allergic/Immunologic: Negative.   Neurological: Negative.   Hematological: Negative.   Psychiatric/Behavioral: Negative.        Objective:   Physical Exam  Constitutional: She is oriented to person, place, and time. She appears well-developed and well-nourished. No distress.  HENT:  Head: Normocephalic and atraumatic.  Eyes: Conjunctivae and  EOM are normal. Pupils are equal, round, and reactive to light.  Neck: Normal range of motion. No JVD present.  Pulmonary/Chest: No stridor.  Neurological: She is alert and oriented to person, place, and time.  Skin: Skin is warm and dry. She is not diaphoretic.  Psychiatric: She has a normal mood and affect.  Nursing note and vitals reviewed.    4/5 Left side at the deltoid bicep tricep finger flexors extensors hip flexor knee extensor ankle does flexor 5/5 in the right deltoid bicep tricep grip hip flexion extensor ankle dorsiflexor. Gait is without evidence  of toe drag or knee instability.  She does not use an assistive device.  She has a wide basis support.  Short step length.     Assessment & Plan:  1.  Left hemiparesis secondary to right MCA distribution infarct she has had an excellent recovery thus far.  She is continuing outpatient therapy to work on her balance and left upper and left lower extremity strength. She has no significant spasticity. We discussed her dental work she should get the okay to come off Plavix from neurology. We did discuss that is now 3 months post stroke and she may discontinue the aspirin and take Plavix only 75 mg/day according to neurology instructions  Physical medicine and rehabilitation follow-up in 8 weeks

## 2017-01-24 ENCOUNTER — Other Ambulatory Visit: Payer: Self-pay | Admitting: Family Medicine

## 2017-01-25 ENCOUNTER — Encounter: Payer: Medicare Other | Admitting: Occupational Therapy

## 2017-01-25 ENCOUNTER — Ambulatory Visit: Payer: Medicare Other

## 2017-01-26 ENCOUNTER — Telehealth: Payer: Self-pay | Admitting: Family Medicine

## 2017-01-26 NOTE — Telephone Encounter (Signed)
Copied from [place] [phone]. Topic: Quick Communication - Rx Refill/Question >> Jan 26, 2017  3:23 PM Oliver Pila B wrote: Medication: pre printed form RENEWAL FOR DIABETIC SUPPLIES Has the patient contacted their pharmacy? Yes.   (Agent: If no, request that the patient contact the pharmacy for the refill.) Preferred Pharmacy (with phone number or street name): [facility] Agent: Please be advised that RX refills may take up to 3 business days. We ask that you follow-up with your pharmacy.  JUST CALL AND GIVE THE TESTING FREQUENCY; leave a VM if no one answers

## 2017-01-26 NOTE — Telephone Encounter (Signed)
Left testing frequency on voicemail.

## 2017-01-27 ENCOUNTER — Encounter: Payer: Medicare Other | Admitting: Occupational Therapy

## 2017-01-27 ENCOUNTER — Ambulatory Visit: Payer: Medicare Other

## 2017-01-27 ENCOUNTER — Ambulatory Visit: Payer: Medicare Other | Admitting: Speech Pathology

## 2017-01-28 LAB — CUP PACEART REMOTE DEVICE CHECK
Date Time Interrogation Session: 20190107204157
Implantable Pulse Generator Implant Date: 20181009

## 2017-02-01 ENCOUNTER — Encounter: Payer: Self-pay | Admitting: Physical Therapy

## 2017-02-01 ENCOUNTER — Ambulatory Visit: Payer: Medicare Other | Admitting: Speech Pathology

## 2017-02-01 ENCOUNTER — Encounter: Payer: Self-pay | Admitting: Speech Pathology

## 2017-02-01 ENCOUNTER — Encounter: Payer: Medicare Other | Admitting: Occupational Therapy

## 2017-02-01 ENCOUNTER — Ambulatory Visit: Payer: Medicare Other | Admitting: Physical Therapy

## 2017-02-01 DIAGNOSIS — M6281 Muscle weakness (generalized): Secondary | ICD-10-CM | POA: Diagnosis not present

## 2017-02-01 DIAGNOSIS — I69354 Hemiplegia and hemiparesis following cerebral infarction affecting left non-dominant side: Secondary | ICD-10-CM | POA: Diagnosis not present

## 2017-02-01 DIAGNOSIS — R2681 Unsteadiness on feet: Secondary | ICD-10-CM | POA: Diagnosis not present

## 2017-02-01 DIAGNOSIS — R2689 Other abnormalities of gait and mobility: Secondary | ICD-10-CM

## 2017-02-01 DIAGNOSIS — R471 Dysarthria and anarthria: Secondary | ICD-10-CM

## 2017-02-01 DIAGNOSIS — I69318 Other symptoms and signs involving cognitive functions following cerebral infarction: Secondary | ICD-10-CM | POA: Diagnosis not present

## 2017-02-01 DIAGNOSIS — R278 Other lack of coordination: Secondary | ICD-10-CM | POA: Diagnosis not present

## 2017-02-01 NOTE — Therapy (Signed)
[facility] [address] [place], [place], [address] Phone: [phone]   Fax:  [phone]  Speech Language Pathology Treatment  Patient Details  Name: Sarah [name] MRN: [id number] Date of Birth: 05-28-1945 Referring Provider: Alysia Penna, MD   Encounter Date: 02/01/2017  End of Session - 02/01/17 1200    SLP Start Time  1017    SLP Stop Time   1059    SLP Time Calculation (min)  42 min       Past Medical History:  Diagnosis Date  . Boils   . Glaucoma   . Heart murmur   . HTN (hypertension)   . Hx of adenomatous colonic polyps   . Hyperlipidemia   . Osteoporosis   . Pneumonia   . Stroke ([place])   . Type II or unspecified type diabetes mellitus without mention of complication, uncontrolled     Past Surgical History:  Procedure Laterality Date  . ABDOMINAL HYSTERECTOMY    . CARPAL TUNNEL RELEASE     right  . COLONOSCOPY  12-10-10   per Dr. Deatra Ina, clear, repeat in 7 yrs   . INCISION AND DRAINAGE PERIRECTAL ABSCESS N/A 09/26/2015   Procedure: IRRIGATION AND DEBRIDEMENT PERIRECTAL ABSCESS;  Surgeon: Mickeal Skinner, MD;  Location: [facility];  Service: General;  Laterality: N/A;  . KNEE ARTHROSCOPY     right  . LOOP RECORDER INSERTION N/A 10/19/2016   Procedure: LOOP RECORDER INSERTION;  Surgeon: Thompson Grayer, MD;  Location: [facility];  Service: Cardiovascular;  Laterality: N/A;  . ORIF ANKLE FRACTURE Right 03/24/2012   Procedure: OPEN REDUCTION INTERNAL FIXATION (ORIF) ANKLE FRACTURE;  Surgeon: Newt Minion, MD;  Location: [facility];  Service: Orthopedics;  Laterality: Right;  Open Reduction Internal Fixation Right Bimalleolar ankle fracture  . POLYPECTOMY    . TEE WITHOUT CARDIOVERSION N/A 10/18/2016   Procedure: TRANSESOPHAGEAL ECHOCARDIOGRAM (TEE);  Surgeon: Fay Records, MD;  [facility] END[facility];  Service: Cardiovascular;  Laterality: N/A;  . TONSILLECTOMY      There were no  vitals filed for this visit.  Subjective Assessment - 02/01/17 1024    Subjective  "I have been practicing breathing"    Patient is accompained by:  Family member    Currently in Pain?  No/denies            ADULT SLP TREATMENT - 02/01/17 1027      General Information   Behavior/Cognition  Alert;Cooperative;Pleasant mood      Treatment Provided   Treatment provided  Cognitive-Linquistic      Cognitive-Linquistic Treatment   Treatment focused on  Dysarthria    Skilled Treatment  Pt demonstrated abdominal breathing in isolation with rare min A. Pt required usual mod A to coordinate abdominal breathing with sentences (reading) 8-10 words long. She required  ongoing cues to maintain abdominal in conversation, as she reverted back to [place]low breathing, suboptimal volume and hoarse voice returns      Assessment / Recommendations / [place] with current plan of care         SLP Short Term Goals - 02/01/17 1057      SLP SHORT TERM GOAL #1   Title  pt will perform HEP with rare min A over three sessions    Time  2    Period  Weeks    Status  On-going      SLP SHORT TERM GOAL #2   Title  pt will  perform respiratory muscle training exercises with modified independence over two sessions    Time  2    Period  Weeks    Status  On-going      SLP SHORT TERM GOAL #3   Title  pt will demo compensatory strategies for intelligibility 70% of the time in 10 minutes simple conversation    Time  2    Period  Weeks    Status  On-going      SLP SHORT TERM GOAL #4   Title  pt will demo abdominal breathing 80% of the time in sentence responses x3 sessions    Time  2    Period  Weeks    Status  On-going       SLP Long Term Goals - 02/01/17 1100      SLP LONG TERM GOAL #1   Title  pt will use compensatory strategies for intelligibilty with modified independence in 10 minutes mod complex conversation x2 sessions    Time  6    Period  Weeks    Status  On-going       SLP LONG TERM GOAL #2   Title  pt will demo respiratory muscle training exercises with independence x2 sessions    Time  6    Period  Weeks    Status  On-going      SLP LONG TERM GOAL #3   Title  pt will perform HEP for dysarthria with modified independence x3 sessions    Time  6    Period  Weeks    Status  On-going      SLP LONG TERM GOAL #4   Title  pt will demo abdominal breathing functionally in 10 mintues mod complex conversation     Time  6    Period  Weeks    Status  On-going       Plan - 02/01/17 1057    Clinical Impression Statement  Pt continues with mild dysarthria in conversation. Pt utilized abdominal breathing in structured tasks with occasional min A; benefits from biofeedback (visual, tactile) to improve awareness of AB. Continue skilled ST to maximize intellgibiltiy    Speech Therapy Frequency  2x / week    Treatment/Interventions  Oral motor exercises;SLP instruction and feedback;Environmental controls;Cueing hierarchy;Compensatory strategies;Internal/external aids;Patient/family education    Potential to Achieve Goals  Good       Patient will benefit from skilled therapeutic intervention in order to improve the following deficits and impairments:   Dysarthria and anarthria    Problem List Patient Active Problem List   Diagnosis Date Noted  . Hemiparesis affecting left side as late effect of cerebrovascular accident (CVA) ([place]) 11/11/2016  . Gait disturbance, post-stroke 11/11/2016  . Dysarthria as late effect of stroke 11/11/2016  . Carotid artery stenosis 11/02/2016  . Right middle cerebral artery stroke ([place]) 10/19/2016  . Left arm weakness   . Diastolic dysfunction   . Hypertension with heart disease   . Tobacco abuse   . Acute blood loss anemia   . Cerebral infarction due to embolism of right middle cerebral artery ([place])   . Stroke (cerebrum) ([place]) 10/15/2016  . Perirectal abscess 09/26/2015  . Vitamin D deficiency 07/23/2015  . Osteopenia  02/16/2013  . Ankle fracture 01/19/2013  . Anemia 09/27/2011  . Uncontrolled type 2 diabetes mellitus with insulin therapy ([place]) 09/24/2011  . Hyperlipidemia 09/24/2011  . Hypertension 09/24/2011    Lorraina Spring, Annye Rusk MS, CCC-SLP 02/01/2017, 12:01 PM  [facility] [address] [place], [place], [address] Phone: [phone]   Fax:  [phone]   Name: KODIE PICK MRN: [id number] Date of Birth: 02/01/1945

## 2017-02-01 NOTE — Therapy (Signed)
[facility] [address] [place], [place], [address] Phone: [phone]   Fax:  [phone]  Physical Therapy Treatment  Patient Details  Name: Sarah [name] MRN: [id number] Date of Birth: July 29, 1945 Referring Provider: Alysia Penna MD   Encounter Date: 02/01/2017  PT End of Session - 02/01/17 1107    Visit Number  15    Number of Visits  27 recert 13/24    Date for PT Re-Evaluation  02/28/17    Authorization Type  [employer] Medicare    Authorization Time Period  progress note every 10th visit; 12/30/16 to 02/28/17    PT Start Time  1104    PT Stop Time  1142    PT Time Calculation (min)  38 min    Equipment Utilized During Treatment  Gait belt    Activity Tolerance  Patient tolerated treatment well    Behavior During Therapy  [facility] for tasks assessed/performed       Past Medical History:  Diagnosis Date  . Boils   . Glaucoma   . Heart murmur   . HTN (hypertension)   . Hx of adenomatous colonic polyps   . Hyperlipidemia   . Osteoporosis   . Pneumonia   . Stroke ([place])   . Type II or unspecified type diabetes mellitus without mention of complication, uncontrolled     Past Surgical History:  Procedure Laterality Date  . ABDOMINAL HYSTERECTOMY    . CARPAL TUNNEL RELEASE     right  . COLONOSCOPY  12-10-10   per Dr. Deatra Ina, clear, repeat in 7 yrs   . INCISION AND DRAINAGE PERIRECTAL ABSCESS N/A 09/26/2015   Procedure: IRRIGATION AND DEBRIDEMENT PERIRECTAL ABSCESS;  Surgeon: Mickeal Skinner, MD;  Location: [place];  Service: General;  Laterality: N/A;  . KNEE ARTHROSCOPY     right  . LOOP RECORDER INSERTION N/A 10/19/2016   Procedure: LOOP RECORDER INSERTION;  Surgeon: Thompson Grayer, MD;  Location: [place] [facility];  Service: Cardiovascular;  Laterality: N/A;  . ORIF ANKLE FRACTURE Right 03/24/2012   Procedure: OPEN REDUCTION INTERNAL FIXATION (ORIF) ANKLE FRACTURE;  Surgeon: Newt Minion, MD;   Location: [facility];  Service: Orthopedics;  Laterality: Right;  Open Reduction Internal Fixation Right Bimalleolar ankle fracture  . POLYPECTOMY    . TEE WITHOUT CARDIOVERSION N/A 10/18/2016   Procedure: TRANSESOPHAGEAL ECHOCARDIOGRAM (TEE);  Surgeon: Fay Records, MD;  Location: [facility] ENDOS[facility];  Service: Cardiovascular;  Laterality: N/A;  . TONSILLECTOMY      There were no vitals filed for this visit.  Subjective Assessment - 02/01/17 1107    Subjective  No new complaints. No falls or pain to report.     Pertinent History  HTN, HLD, DM, neurop, tobacco, COPD, OP (lives w/ mom; sister came to stay)    Limitations  Standing;Walking    How long can you stand comfortably?  15 min    Patient Stated Goals  to improve the use of her left hand and walk without a walker    Currently in Pain?  No/denies    Pain Score  0-No pain           [facility] Adult PT Treatment/Exercise - 02/01/17 1108      High Level Balance   High Level Balance Activities  Braiding;Tandem walking;Head turns tandem fwd/bwd, head movements up/down, left/right    High Level Balance Comments  on both red mats with no UE support: tandem walking fwd/bwd for 3 laps each with  min guard to min assist for balance with cues on posture and ex form; fwd/bwd gait on mats with head turns left<>right, then up<>down x 3 laps each. mild dizziness with up<>down had movements that resolved with rest breaks between laps.       Neuro Re-ed    Neuro Re-ed Details   neuro re-ed to address balance/multi-tasking/coordination: forward walking while tossing hankerchief up/catching it while naming foods A-Z with max cues to keep tossing hankerchiefl while thinking, cues to food names ~50% and min guard for balance with no toe scuffing noted today.            Balance Exercises - 02/01/17 1139      Balance Exercises: Standing   Standing Eyes Closed  Wide (BOA);Head turns;Foam/compliant surface;Other reps (comment);30 secs;Limitations      Balance  Exercises: Standing   Standing Eyes Closed Limitations  with feet hip width apart on thick blue foam in corner with chair in front for safety: EC no head movements, progressing to EC head movements lef<>right, up<>down and diagonals both ways. min guard to min assist for balance with occasional UE touch to walls/chair for balance.           PT Short Term Goals - 12/31/16 1750      PT SHORT TERM GOAL #1   Title  Assess gait velocity and set STG and LTG as appropriate. (Target all STGs 12/08/16)    Baseline  11/16/16: met today with PT to set goals    Status  Achieved      PT SHORT TERM GOAL #2   Title  Patient will be independent with HEP for balance, strength, and gait training.     Time  4    Period  Weeks    Status  Achieved      PT SHORT TERM GOAL #3   Title  Patient will ambulate 250 ft on level/unlevel exterior surfaces with LRAD modified independent.     Baseline  12/07/16  250 ft x 2 level indoor surfaces due to weather    Time  4    Period  Weeks    Status  Unable to assess      PT SHORT TERM GOAL #4   Title  Patient will improve TUG to <15 seconds with LRAD.     Baseline  12/07/16 11.06 sec no device    Time  8    Period  Weeks    Status  Achieved      PT SHORT TERM GOAL #5   Title  Patient will complete FGA with goal set as appropriate. (Target date 01/20/17)    Time  3    Period  Weeks    Status  New    Target Date  01/20/17        PT Long Term Goals - 12/30/16 0948      PT LONG TERM GOAL #1   Title  Patient will be independent with updated HEP for balance, strength and ambulation. (Target date all LTGs 01/07/17)    Time  8    Period  Weeks    Status  Achieved      PT LONG TERM GOAL #2   Title  Patient will verbalize her plan for continued community-based activity plan to assist with reducing stroke risk factors.    Baseline  12/07/16 discussed joining gym vs walking at mall or other large stores    Time  8    Period  Weeks  Status  Achieved       PT LONG TERM GOAL #3   Title  Patient will ambulate 1000 ft on outside surfaces with LRAD at modified independent level; 12/07/16 goal updated to no AD    Baseline  Simulated over mats due to weather with minguard assist    Time  8    Period  Weeks    Status  Not Met      PT LONG TERM GOAL #4   Title  Patient will improve TUG to <13.5 seconds with LRAD.     Baseline  12/07/16 11.06 sec    Time  8    Period  Weeks    Status  Achieved      PT LONG TERM GOAL #5   Title  Patient will ambulate up/down 4 steps with light use of rail and step-over-step sequencing.     Time  4    Period  Weeks    Status  Achieved      Additional Long Term Goals   Additional Long Term Goals  Yes      PT LONG TERM GOAL #6   Title  Patient will ambulate with head turns over level, indoor surface while carrying two simulated bags of groceries of different weights x 200 ft modified independent. (Target date for LTGs 02/10/17)    Time  6    Period  Weeks    Status  New    Target Date  02/10/17      PT LONG TERM GOAL #7   Title  Pateint will ambulate 500 ft over unlevel outdoor surfaces (grass, gravel, curbs, inclines) with no assistive device modified independent. (distance reduced due to winter season)    Time  6    Period  Weeks    Status  New      PT LONG TERM GOAL #8   Title  Patient will carry full laundry basket up/down 12 steps modified independent.     Time  6    Period  Weeks    Status  New      PT LONG TERM GOAL  #9   TITLE  pateint will be independent with updated/final HEP and have a plan for continued community-based fitness program upon discharge from PT.     Time  6    Period  Weeks    Status  New            Plan - 02/01/17 1107    Clinical Impression Statement  Continued to focus on dynamic gait and high level balance activities with today's skilled session with no issues reported. Pt is progressing toward goals and should benefit from continued PT to progress toward unmet  goals.    Rehab Potential  Good    PT Frequency  2x / week updated for recert 97/67/34    PT Duration  6 weeks    PT Treatment/Interventions  ADLs/Self Care Home Management;DME Instruction;Gait training;Stair training;Functional mobility training;Orthotic Fit/Training;Patient/family education;Neuromuscular re-education;Balance training;Therapeutic exercise;Therapeutic activities;Passive range of motion;Cognitive remediation    PT Next Visit Plan  continue to work on high level balance with emphasis on complaint surfaces/vision removed, and add to corner HEP as needed    Consulted and Agree with Plan of Care  Patient       Patient will benefit from skilled therapeutic intervention in order to improve the following deficits and impairments:  Decreased balance, Decreased knowledge of use of DME, Decreased mobility, Decreased strength, Difficulty walking, Impaired UE functional  use, Impaired vision/preception, Obesity  Visit Diagnosis: Unsteadiness on feet  Muscle weakness (generalized)  Other abnormalities of gait and mobility     Problem List Patient Active Problem List   Diagnosis Date Noted  . Hemiparesis affecting left side as late effect of cerebrovascular accident (CVA) ([place]) 11/11/2016  . Gait disturbance, post-stroke 11/11/2016  . Dysarthria as late effect of stroke 11/11/2016  . Carotid artery stenosis 11/02/2016  . Right middle cerebral artery stroke ([place]) 10/19/2016  . Left arm weakness   . Diastolic dysfunction   . Hypertension with heart disease   . Tobacco abuse   . Acute blood loss anemia   . Cerebral infarction due to embolism of right middle cerebral artery ([place])   . Stroke (cerebrum) ([place]) 10/15/2016  . Perirectal abscess 09/26/2015  . Vitamin D deficiency 07/23/2015  . Osteopenia 02/16/2013  . Ankle fracture 01/19/2013  . Anemia 09/27/2011  . Uncontrolled type 2 diabetes mellitus with insulin therapy ([place]) 09/24/2011  . Hyperlipidemia 09/24/2011  .  Hypertension 09/24/2011    Willow Ora, PTA, [facility] [address] [place], [place] [address] [phone] 02/01/17, 10:57 PM   Name: Sarah [name] MRN: [id number] Date of Birth: 24-Oct-1945

## 2017-02-01 NOTE — Patient Instructions (Signed)
  Continue daily abdominal breathing 10 minutes twice a day  After the breathing, read 10 sentences taking a big breath before each one, making each sound distinct  Use a powerful voice to reduced the hoarseness  When you are asked to repeat yourself - take a big breath

## 2017-02-02 ENCOUNTER — Ambulatory Visit: Payer: Medicare Other | Admitting: Physical Therapy

## 2017-02-02 ENCOUNTER — Encounter: Payer: Medicare Other | Admitting: Occupational Therapy

## 2017-02-02 ENCOUNTER — Ambulatory Visit: Payer: Medicare Other

## 2017-02-08 ENCOUNTER — Ambulatory Visit: Payer: Medicare Other | Admitting: Physical Therapy

## 2017-02-08 ENCOUNTER — Ambulatory Visit: Payer: Medicare Other

## 2017-02-08 ENCOUNTER — Encounter: Payer: Self-pay | Admitting: Physical Therapy

## 2017-02-08 DIAGNOSIS — M6281 Muscle weakness (generalized): Secondary | ICD-10-CM | POA: Diagnosis not present

## 2017-02-08 DIAGNOSIS — R278 Other lack of coordination: Secondary | ICD-10-CM | POA: Diagnosis not present

## 2017-02-08 DIAGNOSIS — R471 Dysarthria and anarthria: Secondary | ICD-10-CM | POA: Diagnosis not present

## 2017-02-08 DIAGNOSIS — R2681 Unsteadiness on feet: Secondary | ICD-10-CM

## 2017-02-08 DIAGNOSIS — I69318 Other symptoms and signs involving cognitive functions following cerebral infarction: Secondary | ICD-10-CM | POA: Diagnosis not present

## 2017-02-08 DIAGNOSIS — R2689 Other abnormalities of gait and mobility: Secondary | ICD-10-CM

## 2017-02-08 DIAGNOSIS — I69354 Hemiplegia and hemiparesis following cerebral infarction affecting left non-dominant side: Secondary | ICD-10-CM | POA: Diagnosis not present

## 2017-02-08 NOTE — Patient Instructions (Signed)
Feet Partial Heel-Toe (Compliant Surface) Head Motion - Eyes Open    With eyes open, standing on compliant surface:  right foot partially in front of the other, move head slowly: up and down x 10, left and right x 10. Repeat with left foot in front.  Do __1__ sessions per day.  Copyright  VHI. All rights reserved.   Feet Together (Compliant Surface) Head Motion - Eyes Closed    Stand on compliant surface: with feet together. Close eyes and hold for 60 seconds. Attempt __3_ times per session. Do __1__ sessions per day.  Copyright  VHI. All rights reserved.

## 2017-02-08 NOTE — Therapy (Signed)
[facility] [address] [place], [place], [address] Phone: [phone]   Fax:  [phone]  Physical Therapy Treatment  Patient Details  Name: Sarah [name] MRN: [id number] Date of Birth: [birth year] Referring Provider: Alysia Penna MD   Encounter Date: 02/08/2017  PT End of Session - 02/08/17 1937    Visit Number  16    Number of Visits  31 recert 7/74/14    Date for PT Re-Evaluation  04/09/17    Authorization Type  [employer] Medicare    Authorization Time Period  12/30/16 to 02/28/17; 02/08/17 to 04/09/17    PT Start Time  0815 late arrival    PT Stop Time  0846    PT Time Calculation (min)  31 min    Equipment Utilized During Treatment  Gait belt    Activity Tolerance  Patient tolerated treatment well    Behavior During Therapy  [facility] for tasks assessed/performed       Past Medical History:  Diagnosis Date  . Boils   . Glaucoma   . Heart murmur   . HTN (hypertension)   . Hx of adenomatous colonic polyps   . Hyperlipidemia   . Osteoporosis   . Pneumonia   . Stroke ([place])   . Type II or unspecified type diabetes mellitus without mention of complication, uncontrolled     Past Surgical History:  Procedure Laterality Date  . ABDOMINAL HYSTERECTOMY    . CARPAL TUNNEL RELEASE     right  . COLONOSCOPY  12-10-10   per Dr. Deatra Ina, clear, repeat in 7 yrs   . INCISION AND DRAINAGE PERIRECTAL ABSCESS N/A 09/26/2015   Procedure: IRRIGATION AND DEBRIDEMENT PERIRECTAL ABSCESS;  Surgeon: Mickeal Skinner, MD;  Location: [place];  Service: General;  Laterality: N/A;  . KNEE ARTHROSCOPY     right  . LOOP RECORDER INSERTION N/A 10/19/2016   Procedure: LOOP RECORDER INSERTION;  Surgeon: Thompson Grayer, MD;  Location: [place] [facility];  Service: Cardiovascular;  Laterality: N/A;  . ORIF ANKLE FRACTURE Right 03/24/2012   Procedure: OPEN REDUCTION INTERNAL FIXATION (ORIF) ANKLE FRACTURE;  Surgeon: Newt Minion, MD;   Location: [place];  Service: Orthopedics;  Laterality: Right;  Open Reduction Internal Fixation Right Bimalleolar ankle fracture  . POLYPECTOMY    . TEE WITHOUT CARDIOVERSION N/A 10/18/2016   Procedure: TRANSESOPHAGEAL ECHOCARDIOGRAM (TEE);  Surgeon: Fay Records, MD;  Location: [facility] ENDOSCOPY;  Service: Cardiovascular;  Laterality: N/A;  . TONSILLECTOMY      There were no vitals filed for this visit.  Subjective Assessment - 02/08/17 0954    Subjective  No new complaints. No falls or pain to report. Has been able to carry her laundry basket upstairs herself. Typically "slides" it down the steps (one hand on rail, one hand on basket pulling it)    Pertinent History  HTN, HLD, DM, neurop, tobacco, COPD, OP (lives w/ mom; sister came to stay)    Limitations  Standing;Walking    How long can you stand comfortably?  15 min    Patient Stated Goals  to improve the use of her left hand and walk without a walker    Currently in Pain?  No/denies         [facility] PT Assessment - 02/08/17 0001      Functional Gait  Assessment   Gait assessed   Yes    Gait Level Surface  Walks 20 ft in less than 7 sec but greater than  5.5 sec, uses assistive device, slower speed, mild gait deviations, or deviates 6-10 in outside of the 12 in walkway width.    Change in Gait Speed  Able to smoothly change walking speed without loss of balance or gait deviation. Deviate no more than 6 in outside of the 12 in walkway width.    Gait with Horizontal Head Turns  Performs head turns smoothly with no change in gait. Deviates no more than 6 in outside 12 in walkway width    Gait with Vertical Head Turns  Performs head turns with no change in gait. Deviates no more than 6 in outside 12 in walkway width.    Gait and Pivot Turn  Pivot turns safely within 3 sec and stops quickly with no loss of balance.    Step Over Obstacle  Is able to step over 2 stacked shoe boxes taped together (9 in total height) without changing gait speed. No  evidence of imbalance.    Gait with Narrow Base of Support  Ambulates 4-7 steps.    Gait with Eyes Closed  Walks 20 ft, uses assistive device, slower speed, mild gait deviations, deviates 6-10 in outside 12 in walkway width. Ambulates 20 ft in less than 9 sec but greater than 7 sec.    Ambulating Backwards  Walks 20 ft, uses assistive device, slower speed, mild gait deviations, deviates 6-10 in outside 12 in walkway width.    Steps  Alternating feet, must use rail.    Total Score  24                  [facility] Adult PT Treatment/Exercise - 02/08/17 0955      Transfers   Transfers  Sit to Stand    Sit to Stand  7: Independent      Ambulation/Gait   Ambulation/Gait Assistance  6: Modified independent (Device/Increase time)    Ambulation/Gait Assistance Details  slightly slow velocity    Ambulation Distance (Feet)  400 Feet 200    Assistive device  None    Gait Pattern  Step-through pattern;Decreased stride length;Poor foot clearance - left;Decreased dorsiflexion - left    Ambulation Surface  Indoor    Stairs Assistance  4: Min guard    Stairs Assistance Details (indicate cue type and reason)  for safety as pt unsteady ascending with basket in her hands (leaned against rail and caught her balance)    Stair Management Technique  No rails;One rail Right;Alternating pattern;Step to pattern;Forwards no rail ascend, alternating; one rail descend step-to    Number of Stairs  4    Height of Stairs  6    Ramp  6: Modified independent (Device)    Ramp Details (indicate cue type and reason)  x 2 carrying grocery bags vs laundry basket    Curb  5: Supervision;6: Modified independent (Device/increase time)    Curb Details (indicate cue type and reason)  modified independent with grocery bags each hand; supervision with laundry basket with vision obstructed      Timed Up and Go Test   TUG  Normal TUG    Normal TUG (seconds)  8.87          Balance Exercises - 02/08/17 1931       Balance Exercises: Standing   Standing Eyes Opened  Narrow base of support (BOS);Head turns;Foam/compliant surface feet together; partial tandem lt and rt    Standing Eyes Closed  Narrow base of support (BOS);Foam/compliant surface feet together    Gait  with Head Turns  Forward horizontal, vertical        PT Education - 02/08/17 1936    Education provided  Yes    Education Details  results of assessment and LTGs; updates to HEP; importance of continued exercise upon discharge from PT    Person(s) Educated  Patient    Methods  Explanation;Demonstration;Handout    Comprehension  Verbalized understanding;Returned demonstration;Need further instruction       PT Short Term Goals - 12/31/16 1750      PT SHORT TERM GOAL #1   Title  Assess gait velocity and set STG and LTG as appropriate. (Target all STGs 12/08/16)    Baseline  11/16/16: met today with PT to set goals    Status  Achieved      PT SHORT TERM GOAL #2   Title  Patient will be independent with HEP for balance, strength, and gait training.     Time  4    Period  Weeks    Status  Achieved      PT SHORT TERM GOAL #3   Title  Patient will ambulate 250 ft on level/unlevel exterior surfaces with LRAD modified independent.     Baseline  12/07/16  250 ft x 2 level indoor surfaces due to weather    Time  4    Period  Weeks    Status  Unable to assess      PT SHORT TERM GOAL #4   Title  Patient will improve TUG to <15 seconds with LRAD.     Baseline  12/07/16 11.06 sec no device    Time  8    Period  Weeks    Status  Achieved      PT SHORT TERM GOAL #5   Title  Patient will complete FGA with goal set as appropriate. (Target date 01/20/17)    Time  3    Period  Weeks    Status  New    Target Date  01/20/17        PT Long Term Goals - 02/08/17 1438      PT LONG TERM GOAL #1   Title  see prior notes for eariler LTGs    Status  Achieved      PT LONG TERM GOAL #6   Title  Patient will ambulate with head turns over  level, indoor surface while carrying two simulated bags of groceries of different weights x 200 ft modified independent. (Target date for LTGs 02/10/17)    Baseline  02/08/17 Ambulated 300 ft with two grocery bags and performing head turns without imbalance.     Time  6    Period  Weeks    Status  Achieved      PT LONG TERM GOAL #7   Title  Pateint will ambulate 500 ft over unlevel outdoor surfaces (grass, gravel, curbs, inclines) with no assistive device modified independent. (distance reduced due to winter season) Target for remaining LTGs 03/10/17)    Baseline  02/08/17 deferred due to raining    Time  --    Period  --    Status  Deferred    Target Date  03/10/17      PT LONG TERM GOAL #8   Title  Patient will carry full laundry basket up/down 12 steps modified independent.     Baseline  02/08/17 minguard assist due to imbalance    Time  --    Period  --    Status  On-going    Target Date  03/10/17      PT LONG TERM GOAL  #9   TITLE  pateint will be independent with updated/final HEP and have a plan for continued community-based fitness program upon discharge from PT.     Baseline  02/08/17 independent with HEP; has not yet looked into silver sneakers program through her insurance--reports will do today[place]    [place]  --    Period  --    Status  On-going    Target Date  03/10/17            Plan - 02/08/17 1939    Clinical Impression Statement  LTGs assessed with pt meeting 1 of 4 goals, 2 goals partially met/ongoing, and 1 goal deferred (unable to assess due to weather this date). Patient has completed only 16 of predicted 27 visits due to weather events causing clinic closure and her own hospitalization. Patient can benefit from decreasing frequency from 2x/wk to 1x/wk to address unmet goals, finalize updates to HEP, and educate patient on appropriate types of classes or equipment to use when she begins going to the gym to exercise.     Rehab Potential   Good    PT Frequency  1x / week updated for recert 6/43/32    PT Duration  4 weeks updated for recert 9/51/88    PT Treatment/Interventions  ADLs/Self Care Home Management;DME Instruction;Gait training;Stair training;Functional mobility training;Orthotic Fit/Training;Patient/family education;Neuromuscular re-education;Balance training;Therapeutic exercise;Therapeutic activities;Passive range of motion;Cognitive remediation    PT Next Visit Plan  finish updating HEP for incr challenge to her balance; educate on [facility]; continue to work on high level balance with emphasis on complaint surfaces/vision removed,     Consulted and Agree with Plan of Care  Patient       Patient will benefit from skilled therapeutic intervention in order to improve the following deficits and impairments:  Decreased balance, Decreased knowledge of use of DME, Decreased mobility, Decreased strength, Difficulty walking, Impaired UE functional use, Impaired vision/preception, Obesity, Pain  Visit Diagnosis: Unsteadiness on feet - Plan: PT plan of care cert/re-cert  Muscle weakness (generalized) - Plan: PT plan of care cert/re-cert  Other abnormalities of gait and mobility - Plan: PT plan of care cert/re-cert     Problem List Patient Active Problem List   Diagnosis Date Noted  . Hemiparesis affecting left side as late effect of cerebrovascular accident (CVA) ([place]) 11/11/2016  . Gait disturbance, post-stroke 11/11/2016  . Dysarthria as late effect of stroke 11/11/2016  . Carotid artery stenosis 11/02/2016  . Right middle cerebral artery stroke ([place]) 10/19/2016  . Left arm weakness   . Diastolic dysfunction   . Hypertension with heart disease   . Tobacco abuse   . Acute blood loss anemia   . Cerebral infarction due to embolism of right middle cerebral artery ([place])   . Stroke (cerebrum) ([place]) 10/15/2016  . Perirectal abscess 09/26/2015  . Vitamin D deficiency  07/23/2015  . Osteopenia 02/16/2013  . Ankle fracture 01/19/2013  . Anemia 09/27/2011  . Uncontrolled type 2 diabetes mellitus with insulin therapy ([place]) 09/24/2011  . Hyperlipidemia 09/24/2011  . Hypertension 09/24/2011    Rexanne Mano, PT 02/08/2017, 7:59 PM  [facility] [address] [place], [place], [address] Phone: [phone]   Fax:  [phone]  Name: Sarah [name] MRN: [id number] Date of Birth: [birth year]

## 2017-02-10 ENCOUNTER — Ambulatory Visit: Payer: Medicare Other

## 2017-02-15 ENCOUNTER — Ambulatory Visit: Payer: Medicare Other | Admitting: Physical Therapy

## 2017-02-15 ENCOUNTER — Ambulatory Visit: Payer: Medicare Other | Attending: Physical Medicine & Rehabilitation

## 2017-02-15 ENCOUNTER — Ambulatory Visit: Payer: Medicare Other | Admitting: Occupational Therapy

## 2017-02-15 ENCOUNTER — Encounter: Payer: Self-pay | Admitting: Occupational Therapy

## 2017-02-15 ENCOUNTER — Encounter: Payer: Self-pay | Admitting: Physical Therapy

## 2017-02-15 DIAGNOSIS — I69318 Other symptoms and signs involving cognitive functions following cerebral infarction: Secondary | ICD-10-CM

## 2017-02-15 DIAGNOSIS — R4701 Aphasia: Secondary | ICD-10-CM | POA: Diagnosis not present

## 2017-02-15 DIAGNOSIS — R278 Other lack of coordination: Secondary | ICD-10-CM

## 2017-02-15 DIAGNOSIS — R2681 Unsteadiness on feet: Secondary | ICD-10-CM

## 2017-02-15 DIAGNOSIS — R2689 Other abnormalities of gait and mobility: Secondary | ICD-10-CM | POA: Diagnosis not present

## 2017-02-15 DIAGNOSIS — I69354 Hemiplegia and hemiparesis following cerebral infarction affecting left non-dominant side: Secondary | ICD-10-CM | POA: Diagnosis not present

## 2017-02-15 DIAGNOSIS — M6281 Muscle weakness (generalized): Secondary | ICD-10-CM

## 2017-02-15 DIAGNOSIS — R41842 Visuospatial deficit: Secondary | ICD-10-CM

## 2017-02-15 DIAGNOSIS — R471 Dysarthria and anarthria: Secondary | ICD-10-CM | POA: Insufficient documentation

## 2017-02-15 NOTE — Therapy (Signed)
[facility] [address] [place], [place], [address] Phone: [phone]   Fax:  [phone]  Physical Therapy Treatment  Patient Details  Name: Sarah [name] MRN: [id number] Date of Birth: [birth year] Referring Provider: Alysia Penna MD   Encounter Date: 02/15/2017  PT End of Session - 02/15/17 1126    Visit Number  17    Number of Visits  31 recert 6/96/29    Date for PT Re-Evaluation  04/09/17    Authorization Type  [employer] Medicare    Authorization Time Period  12/30/16 to 02/28/17; 02/08/17 to 04/09/17    PT Start Time  0800    PT Stop Time  0845    PT Time Calculation (min)  45 min    Equipment Utilized During Treatment  Gait belt    Activity Tolerance  Patient tolerated treatment well    Behavior During Therapy  [facility] for tasks assessed/performed       Past Medical History:  Diagnosis Date  . Boils   . Glaucoma   . Heart murmur   . HTN (hypertension)   . Hx of adenomatous colonic polyps   . Hyperlipidemia   . Osteoporosis   . Pneumonia   . Stroke ([place])   . Type II or unspecified type diabetes mellitus without mention of complication, uncontrolled     Past Surgical History:  Procedure Laterality Date  . ABDOMINAL HYSTERECTOMY    . CARPAL TUNNEL RELEASE     right  . COLONOSCOPY  12-10-10   per Dr. Deatra Ina, clear, repeat in 7 yrs   . INCISION AND DRAINAGE PERIRECTAL ABSCESS N/A 09/26/2015   Procedure: IRRIGATION AND DEBRIDEMENT PERIRECTAL ABSCESS;  Surgeon: Mickeal Skinner, MD;  Location: [facility];  Service: General;  Laterality: N/A;  . KNEE ARTHROSCOPY     right  . LOOP RECORDER INSERTION N/A 10/19/2016   Procedure: LOOP RECORDER INSERTION;  Surgeon: Thompson Grayer, MD;  Location: [facility];  Service: Cardiovascular;  Laterality: N/A;  . ORIF ANKLE FRACTURE Right 03/24/2012   Procedure: OPEN REDUCTION INTERNAL FIXATION (ORIF) ANKLE FRACTURE;  Surgeon: Newt Minion, MD;  Location: [facility];   Service: Orthopedics;  Laterality: Right;  Open Reduction Internal Fixation Right Bimalleolar ankle fracture  . POLYPECTOMY    . TEE WITHOUT CARDIOVERSION N/A 10/18/2016   Procedure: TRANSESOPHAGEAL ECHOCARDIOGRAM (TEE);  Surgeon: Fay Records, MD;  Location: [facility] ENDOS[facility];  Service: Cardiovascular;  Laterality: N/A;  . TONSILLECTOMY      There were no vitals filed for this visit.  Subjective Assessment - 02/15/17 0802    Subjective  Had a close call tripping over an area rug in her mother's room. Reports the edge rolls up. She caught herself on the bed.     Pertinent History  HTN, HLD, DM, neurop, tobacco, COPD, OP (lives w/ mom; sister came to stay)    Limitations  Standing;Walking    How long can you stand comfortably?  15 min    Patient Stated Goals  to improve the use of her left hand and walk without a walker    Currently in Pain?  No/denies                      [facility] Adult PT Treatment/Exercise - 02/15/17 1123      Transfers   Transfers  Sit to Stand    Sit to Stand  7: Independent      Ambulation/Gait   Ambulation/Gait Assistance  6: Modified independent (Device/Increase time);5: Supervision supervision for treadmill    Ambulation/Gait Assistance Details  continues with decr velocity (can incr with vc, however does not maintain); occasional shuffle/scuff of Lt>rt foot    Ambulation Distance (Feet)  240 Feet    Assistive device  None    Gait Pattern  Step-through pattern;Decreased stride length;Poor foot clearance - left;Decreased dorsiflexion - left    Ambulation Surface  Indoor      Knee/Hip Exercises: Aerobic   Tread Mill  6 minutes up to 2.0 mph with bil UE support; pt noted to have difficulty maintaining balance/cadence when releases with one hand to try to adjust the speed; improved with repetition             PT Education - 02/15/17 1126    Education provided  Yes    Education Details  educated to remove area rug she tripped on (it's her  mother's and in her mother's room; she will not remove); educated to try carpet to carpet tape under area rug to secure edges better; see updated HEP    Person(s) Educated  Patient    Methods  Explanation;Demonstration;Handout    Comprehension  Verbalized understanding;Returned demonstration;Need further instruction       PT Short Term Goals - 12/31/16 1750      PT SHORT TERM GOAL #1   Title  Assess gait velocity and set STG and LTG as appropriate. (Target all STGs 12/08/16)    Baseline  11/16/16: met today with PT to set goals    Status  Achieved      PT SHORT TERM GOAL #2   Title  Patient will be independent with HEP for balance, strength, and gait training.     Time  4    Period  Weeks    Status  Achieved      PT SHORT TERM GOAL #3   Title  Patient will ambulate 250 ft on level/unlevel exterior surfaces with LRAD modified independent.     Baseline  12/07/16  250 ft x 2 level indoor surfaces due to weather    Time  4    Period  Weeks    Status  Unable to assess      PT SHORT TERM GOAL #4   Title  Patient will improve TUG to <15 seconds with LRAD.     Baseline  12/07/16 11.06 sec no device    Time  8    Period  Weeks    Status  Achieved      PT SHORT TERM GOAL #5   Title  Patient will complete FGA with goal set as appropriate. (Target date 01/20/17)    Time  3    Period  Weeks    Status  New    Target Date  01/20/17        PT Long Term Goals - 02/08/17 1438      PT LONG TERM GOAL #1   Title  see prior notes for eariler LTGs    Status  Achieved      PT LONG TERM GOAL #6   Title  Patient will ambulate with head turns over level, indoor surface while carrying two simulated bags of groceries of different weights x 200 ft modified independent. (Target date for LTGs 02/10/17)    Baseline  02/08/17 Ambulated 300 ft with two grocery bags and performing head turns without imbalance.     Time  6    Period  Weeks  Status  Achieved      PT LONG TERM GOAL #7   Title   Pateint will ambulate 500 ft over unlevel outdoor surfaces (grass, gravel, curbs, inclines) with no assistive device modified independent. (distance reduced due to winter season) Target for remaining LTGs 03/10/17)    Baseline  02/08/17 deferred due to raining    Time  --    Period  --    Status  Deferred    Target Date  03/10/17      PT LONG TERM GOAL #8   Title  Patient will carry full laundry basket up/down 12 steps modified independent.     Baseline  02/08/17 minguard assist due to imbalance    Time  --    Period  --    Status  On-going    Target Date  03/10/17      PT LONG TERM GOAL  #9   TITLE  pateint will be independent with updated/final HEP and have a plan for continued community-based fitness program upon discharge from PT.     Baseline  02/08/17 independent with HEP; has not yet looked into silver sneakers program through her insurance--reports will do today[place] [facility] [place]    [place]  --    Period  --    Status  On-going    Target Date  03/10/17            Plan - 02/15/17 1128    Clinical Impression Statement  Session focused on updating HEP (adding theraband, upgrading theraband, increasing hold times, and changing out exercises). Patient reports she has a treadmill at home that she does not use. [facility] [employer] as she prefers to walk the track. Patient clearly familiar with her prior exercises and appears motivated to make the transition to community-based exercise program.     Rehab Potential  Good    PT Frequency  1x / week updated for recert 3/50/09    PT Duration  4 weeks updated for recert 3/81/82    PT Treatment/Interventions  ADLs/Self Care Home Management;DME Instruction;Gait training;Stair training;Functional mobility training;Orthotic Fit/Training;Patient/family education;Neuromuscular re-education;Balance training;Therapeutic exercise;Therapeutic activities;Passive range of motion;Cognitive remediation     PT Next Visit Plan  check if pt looked into silver sneakers (if not available thru her insurance, educate on [employer]); use treadmill for warm-up and allow her to adjust the velocity (practice letting go of rail iwth one hand and maintain balance); educate on appropriate equipment or classes to use at the [facility]gym; continue to work on high level balance with emphasis on complaint surfaces/vision removed,     Consulted and Agree with Plan of Care  Patient       Patient will benefit from skilled therapeutic intervention in order to improve the following deficits and impairments:  Decreased balance, Decreased knowledge of use of DME, Decreased mobility, Decreased strength, Difficulty walking, Impaired UE functional use, Impaired vision/preception, Obesity, Pain  Visit Diagnosis: Unsteadiness on feet  Muscle weakness (generalized)     Problem List Patient Active Problem List   Diagnosis Date Noted  . Hemiparesis affecting left side as late effect of cerebrovascular accident (CVA) ([place]) 11/11/2016  . Gait disturbance, post-stroke 11/11/2016  . Dysarthria as late effect of stroke 11/11/2016  . Carotid artery stenosis 11/02/2016  . Right middle cerebral artery stroke ([place]) 10/19/2016  . Left arm weakness   . Diastolic dysfunction   . Hypertension with heart disease   .  Tobacco abuse   . Acute blood loss anemia   . Cerebral infarction due to embolism of right middle cerebral artery ([place])   . Stroke (cerebrum) ([place]) 10/15/2016  . Perirectal abscess 09/26/2015  . Vitamin D deficiency 07/23/2015  . Osteopenia 02/16/2013  . Ankle fracture 01/19/2013  . Anemia 09/27/2011  . Uncontrolled type 2 diabetes mellitus with insulin therapy ([place]) 09/24/2011  . Hyperlipidemia 09/24/2011  . Hypertension 09/24/2011    Rexanne Mano, PT 02/15/2017, 11:33 AM  [facility] [address] [place], [place], [address] Phone: [phone]    Fax:  [phone]  Name: BRYANNE RIQUELME MRN: [id number] Date of Birth: [birth year]

## 2017-02-15 NOTE — Patient Instructions (Signed)
Speech Exercises  Repeat 2 times, 2 times a day  Call the cat "Buttercup" A calendar of [place], [place] Four floors to cover Yellow oil ointment Fellow lovers of felines Catastrophe in [place] Plump plumbers' plums The church's chimes chimed Telling time 'til eleven Five valve levers Keep the gate closed Go see that guy Fat cows give milk [place] [employer]ophers Fat frogs flip freely Tuck [name] into bed Get that game to Greg Thick thistles stick together Cinnamon aluminum linoleum Black bugs blood Lovely lemon linament Red leather, yellow leather  Big grocery buggy    Purple baby carriage [place] [employer] Proper copper coffee pot Ripe purple cabbage Three free throws Tim Tebow tackled  [place] Dave dipped the dessert  [employer] Devils Buckle that bracket    The gospel of Mark Shirts shrink, shells shouldn't [place] 49ers Take the tackle box File the flash message Give me five flapjacks Fundamental relatives Dye the pets purple Talking turkey time after time Dark chocolate chunks Political landscape of the kingdom Electrical engineering genius We played yo-yos yesterday  

## 2017-02-15 NOTE — Therapy (Signed)
[facility] [address] [place], [place], [address] Phone: [phone]   Fax:  [phone]  Occupational Therapy Treatment  Patient Details  Name: Sarah [name] MRN: [id number] Date of Birth: 18-Mar-1945 Referring Provider: Alysia Penna MD   Encounter Date: 02/15/2017  OT End of Session - 02/15/17 1000    Visit Number  11    Number of Visits  16    Date for OT Re-Evaluation  02/15/17    Authorization Type  [facility] medicare     Authorization Time Period  60 days    OT Start Time  [phone]    OT Stop Time  [phone]    OT Time Calculation (min)  21 min    Activity Tolerance  Patient tolerated treatment well       Past Medical History:  Diagnosis Date  . Boils   . Glaucoma   . Heart murmur   . HTN (hypertension)   . Hx of adenomatous colonic polyps   . Hyperlipidemia   . Osteoporosis   . Pneumonia   . Stroke ([place])   . Type II or unspecified type diabetes mellitus without mention of complication, uncontrolled     Past Surgical History:  Procedure Laterality Date  . ABDOMINAL HYSTERECTOMY    . CARPAL TUNNEL RELEASE     right  . COLONOSCOPY  12-10-10   per Dr. Deatra Ina, clear, repeat in 7 yrs   . INCISION AND DRAINAGE PERIRECTAL ABSCESS N/A 09/26/2015   Procedure: IRRIGATION AND DEBRIDEMENT PERIRECTAL ABSCESS;  Surgeon: Mickeal Skinner, MD;  Location: [facility];  Service: General;  Laterality: N/A;  . KNEE ARTHROSCOPY     right  . LOOP RECORDER INSERTION N/A 10/19/2016   Procedure: LOOP RECORDER INSERTION;  Surgeon: Thompson Grayer, MD;  Location: [facility];  Service: Cardiovascular;  Laterality: N/A;  . ORIF ANKLE FRACTURE Right 03/24/2012   Procedure: OPEN REDUCTION INTERNAL FIXATION (ORIF) ANKLE FRACTURE;  Surgeon: Newt Minion, MD;  Location: [facility];  Service: Orthopedics;  Laterality: Right;  Open Reduction Internal Fixation Right Bimalleolar ankle fracture  . POLYPECTOMY    . TEE WITHOUT  CARDIOVERSION N/A 10/18/2016   Procedure: TRANSESOPHAGEAL ECHOCARDIOGRAM (TEE);  Surgeon: Fay Records, MD;  Location: [facility] ENDOSCOPY;  Service: Cardiovascular;  Laterality: N/A;  . TONSILLECTOMY      There were no vitals filed for this visit.  Subjective Assessment - 02/15/17 0933    Subjective   I have been working on things at home.     Pertinent History  Pt with R CVA    Patient Stated Goals  use my hand better - I want to be able to cook    Currently in Pain?  No/denies                   OT Treatments/Exercises (OP) - 02/15/17 0001      ADLs   ADL Comments  Reassessed remaining LTG's - pt has met all goals at this time.  Pt reports that she is back doing everything at home she used to do including cooking, laundry, vaccuming, etc.  Pt does report that she feels she still has trouble with her speech (currently working with ST) and overall endurance (currently working with PT).  Pt expressing interest in joining [facility] when she is done with PT and that she and PT have discussed this.  Pt in agreement that she is ready for discharge.  OT Short Term Goals - 02/15/17 0956      OT SHORT TERM GOAL #1   Title  Pt will be mod I with HEP for coordination and grip/pinch strength LUE - 12/28/2016    Status  Achieved      OT SHORT TERM GOAL #2   Title  Pt will demonstrate improved coordination in LUE as evidenced by decreasing time on 9 hole peg test by at least 20 seconds to assist with functional tasks. (baseline= 1.04.47)    Status  Achieved 12/28/2016  2 trials 49.20 and then 30.20      OT SHORT TERM GOAL #3   Title  Pt will be min a for hot meal prep at ambulatory level and demonstrate good safety awareness    Status  Achieved      OT SHORT TERM GOAL #4   Title  Pt will demonstrate improved grip strength LUE by at least 5 pounds to assist with opening jars (baseline = 10 pounds)    Status  Achieved 12/23/2016  30 pounds        OT Long  Term Goals - 02/15/17 0956      OT LONG TERM GOAL #1   Title  Pt will be mod I with upgraded HEP - 01/25/2017    Status  Achieved      OT LONG TERM GOAL #2   Title  Pt will demonstrate improved grip strength to at least 38 pounds in LUE to assist with home mgmt tasks (baseline = 10 pounds)    Status  Achieved 02/15/2017  41 pounds      OT LONG TERM GOAL #3   Title  Pt will demonstrate improved coordination as evidenced by decreasing time on 9 hole peg test by at least 25 seconds to reduce effort for buttoning, tying    Status  Achieved 02/15/2017  29.1 seconds (baseline 1.04.2      OT LONG TERM GOAL #4   Title  Pt will demonstrate ability to type simple email with extra time.     Status  Achieved pt now able to use both hands to type simple email with extra time.      OT LONG TERM GOAL #5   Title  Pt will be mod I with hot meal prep.    Status  Achieved      OT LONG TERM GOAL #6   Title  Pt will be mod I with home mgmt tasks such as cleaning, laundry    Status  Achieved 12/28/2016 pt reports that she is doing parts of laundry with family's help, dishes and some vacuuming      OT LONG TERM GOAL #7   Title  Pt will verbalize understanding of return to driving recommendations.     Status  Deferred Pt cleared by MD to drive      OT LONG TERM GOAL #8   Title  Pt will demonstrate improved LUE functional use as evidenced by improving score on Box and Blocks by a least 4 blocks to assist with IADL's (baseline = 30)    Status  Achieved 01/18/2017  42 blocks            Plan - 02/15/17 0958    Clinical Impression Statement  Pt has met or exceeded all goals at this time. Pt feels she has resumed home mgt and leisure activities and is not experiencing any difficulties using her Left hand at this time.     Rehab Potential  Good    OT Frequency  2x / week    OT Duration  8 weeks    OT Treatment/Interventions  Self-care/ADL training;Aquatic Therapy;Electrical Stimulation;Moist  Heat;Ultrasound;Therapeutic exercise;Neuromuscular education;DME and/or AE instruction;Manual Therapy;Therapist, nutritional;Therapeutic activities;Cognitive remediation/compensation;Visual/perceptual remediation/compensation;Patient/family education;Balance training    Plan  d/c from OT    Consulted and Agree with Plan of Care  Patient       Patient will benefit from skilled therapeutic intervention in order to improve the following deficits and impairments:  Abnormal gait, Decreased activity tolerance, Decreased cognition, Decreased mobility, Decreased knowledge of use of DME, Decreased coordination, Decreased strength, Impaired UE functional use, Impaired vision/preception  Visit Diagnosis: Unsteadiness on feet - Plan: Ot plan of care cert/re-cert  Muscle weakness (generalized) - Plan: Ot plan of care cert/re-cert  Hemiplegia and hemiparesis following cerebral infarction affecting left non-dominant side ([place]) - Plan: Ot plan of care cert/re-cert  Other symptoms and signs involving cognitive functions following cerebral infarction - Plan: Ot plan of care cert/re-cert  Other lack of coordination - Plan: Ot plan of care cert/re-cert  Visuospatial deficit - Plan: Ot plan of care cert/re-cert    Problem List Patient Active Problem List   Diagnosis Date Noted  . Hemiparesis affecting left side as late effect of cerebrovascular accident (CVA) ([place]) 11/11/2016  . Gait disturbance, post-stroke 11/11/2016  . Dysarthria as late effect of stroke 11/11/2016  . Carotid artery stenosis 11/02/2016  . Right middle cerebral artery stroke ([place]) 10/19/2016  . Left arm weakness   . Diastolic dysfunction   . Hypertension with heart disease   . Tobacco abuse   . Acute blood loss anemia   . Cerebral infarction due to embolism of right middle cerebral artery ([place])   . Stroke (cerebrum) ([place]) 10/15/2016  . Perirectal abscess 09/26/2015  . Vitamin D deficiency 07/23/2015  . Osteopenia  02/16/2013  . Ankle fracture 01/19/2013  . Anemia 09/27/2011  . Uncontrolled type 2 diabetes mellitus with insulin therapy ([place]) 09/24/2011  . Hyperlipidemia 09/24/2011  . Hypertension 09/24/2011   OCCUPATIONAL THERAPY DISCHARGE SUMMARY  Visits from Start of Care: see above  Current functional level related to goals / functional outcomes: See above   Remaining deficits: See note above   Education / Equipment: HEP Plan: Patient agrees to discharge.  Patient goals were met. Patient is being discharged due to meeting the stated rehab goals.  ?????      Quay Burow, OTR/L 02/15/2017, 10:05 AM  [facility] [address] [place], [place], [address] Phone: [phone]   Fax:  [phone]  Name: Sarah [name] MRN: [id number] Date of Birth: 06-Mar-1945

## 2017-02-15 NOTE — Patient Instructions (Signed)
ANKLE: Dorsiflexion (Band)    Sit at edge of surface. Place blue band around top of left foot, then twist the loop and put other side over your right foot. Stretch the band tight by spreading your feet apart. Keeping heel on floor, raise toes of left foot. Hold __3-5_ seconds.  __20_ reps per set, __5_ sets per day, __7_ days per week For standing exercises with band around your ankles, SIT FIRST in a chair by the counter and put band around ankles. THEN stand up at the counter.   Hip Abduction (Standing)    Using green band around ankles. Stand at counter with support. Lift right leg out to side, keeping toe forward. Hold for __3-5_ seconds. Repeat with left leg. Continue alternating right and left.  Repeat _20__ times. Do _1-2__ times a day.     Hip Extension (Standing)    With band around ankles, Stand with support. Move right leg backward with straight knee. Hold for _3-5__ seconds. Repeat with other leg.  Repeat _20__ times. Do __1-2_ times a day.      Single Leg - Eyes Open    At counter for light arm support, lift right leg, bending the knee, while maintaining balance over other leg. Hold__10__ seconds. Repeat __3__ times each leg per session. Do __1-2__ sessions per day.     Heel Raise: Unilateral (Standing)    Stand at counter with light arm support. Balance on left foot, then rise on ball of foot. Hold 3-5 seconds and lower slowly. Repeat __20__ times on each leg. Do __1-2__ sessions per day.  http://orth.exer.us/41   Copyright  VHI. All rights reserved.

## 2017-02-15 NOTE — Therapy (Signed)
[facility] [address] [place], [place], [address] Phone: [phone]   Fax:  [phone]  Speech Language Pathology Treatment  Patient Details  Name: Sarah [name] MRN: [id number] Date of Birth: [birth year] Referring Provider: Alysia Penna, MD   Encounter Date: 02/15/2017  End of Session - 02/15/17 1726    Visit Number  7    Number of Visits  17    Date for SLP Re-Evaluation  03/04/17    SLP Start Time  30    SLP Stop Time   1100    SLP Time Calculation (min)  40 min    Activity Tolerance  Patient tolerated treatment well       Past Medical History:  Diagnosis Date  . Boils   . Glaucoma   . Heart murmur   . HTN (hypertension)   . Hx of adenomatous colonic polyps   . Hyperlipidemia   . Osteoporosis   . Pneumonia   . Stroke ([place])   . Type II or unspecified type diabetes mellitus without mention of complication, uncontrolled     Past Surgical History:  Procedure Laterality Date  . ABDOMINAL HYSTERECTOMY    . CARPAL TUNNEL RELEASE     right  . COLONOSCOPY  12-10-10   per Dr. Deatra Ina, clear, repeat in 7 yrs   . INCISION AND DRAINAGE PERIRECTAL ABSCESS N/A 09/26/2015   Procedure: IRRIGATION AND DEBRIDEMENT PERIRECTAL ABSCESS;  Surgeon: Mickeal Skinner, MD;  Location: [facility];  Service: General;  Laterality: N/A;  . KNEE ARTHROSCOPY     right  . LOOP RECORDER INSERTION N/A 10/19/2016   Procedure: LOOP RECORDER INSERTION;  Surgeon: Thompson Grayer, MD;  Location: [facility];  Service: Cardiovascular;  Laterality: N/A;  . ORIF ANKLE FRACTURE Right 03/24/2012   Procedure: OPEN REDUCTION INTERNAL FIXATION (ORIF) ANKLE FRACTURE;  Surgeon: Newt Minion, MD;  Location: [facility];  Service: Orthopedics;  Laterality: Right;  Open Reduction Internal Fixation Right Bimalleolar ankle fracture  . POLYPECTOMY    . TEE WITHOUT CARDIOVERSION N/A 10/18/2016   Procedure: TRANSESOPHAGEAL ECHOCARDIOGRAM (TEE);   Surgeon: Fay Records, MD;  Location: [facility];  Service: Cardiovascular;  Laterality: N/A;  . TONSILLECTOMY      There were no vitals filed for this visit.         ADULT SLP TREATMENT - 02/15/17 1035      General Information   Behavior/Cognition  Alert;Cooperative;Pleasant mood      Treatment Provided   Treatment provided  Cognitive-Linquistic      Cognitive-Linquistic Treatment   Treatment focused on  Dysarthria    Skilled Treatment  Abdominal breathing at rest completed today with initial mod cues usually for pt to keep shoulders still, faded to min A rarely when visual biofeedback cues were used via a mirror. When asked to count one number at a time on each breath, pt began rocking shoulders/torso back approx 20% of the time. Pt told SLP she was getting light-headed and SLP told pt to take in a little more air. Pt's rocking of torso/shoulders increased in frequency, and SLP utilized a mirror and mod verbal cues for focus on larger breath, belly movement, and shoulders still. SLP reverted to practice with AB at rest with focus on eliminating shoulder movement/rocking torso. SLP req'd to provide mod cues occasionally for abdominal movement and for shoulders still. Practice with dysarthria compensations (Articulation) by reading "tongue twisters" with 85% success using overarticulation and WNL voice quality.  Assessment / Recommendations / Plan   Plan  Continue with current plan of care      Progression Toward Goals   Progression toward goals  Progressing toward goals       SLP Education - 02/15/17 1726    Education provided  Yes    Education Details  "tongue twisters"- slow, big, and loud    Person(s) Educated  Patient    Methods  Explanation;Demonstration;Verbal cues    Comprehension  Verbalized understanding;Returned demonstration;Verbal cues required       SLP Short Term Goals - 02/15/17 1727      SLP SHORT TERM GOAL #1   Title  pt will perform HEP with  rare min A over three sessions    Time  1    Period  Weeks    Status  On-going      SLP SHORT TERM GOAL #2   Title  pt will perform respiratory muscle training exercises with modified independence over two sessions    Time  1    Period  Weeks    Status  On-going      SLP SHORT TERM GOAL #3   Title  pt will demo compensatory strategies for intelligibility 70% of the time in 10 minutes simple conversation    Time  1    Period  Weeks    Status  On-going      SLP SHORT TERM GOAL #4   Title  pt will demo abdominal breathing 80% of the time in sentence responses x3 sessions    Time  1    Period  Weeks    Status  On-going       SLP Long Term Goals - 02/15/17 [place] #1   Title  pt will use compensatory strategies for intelligibilty with modified independence in 10 minutes mod complex conversation x2 sessions    Time  5    Period  Weeks    Status  On-going      SLP LONG TERM GOAL #2   Title  pt will demo respiratory muscle training exercises with independence x2 sessions    Time  5    Period  Weeks    Status  On-going      SLP LONG TERM GOAL #3   Title  pt will perform HEP for dysarthria with modified independence x3 sessions    Time  5    Period  Weeks    Status  On-going      SLP LONG TERM GOAL #4   Title  pt will demo abdominal breathing functionally in 10 mintues mod complex conversation     Time  5    Period  Weeks    Status  On-going       Plan - 02/15/17 1727    Clinical Impression Statement  Pt continues with mild dysarthria in conversation. Pt utilized abdominal breathing at rest and in structured tasks with consistent A from SLP - pt again benefits from visual biofeedback  to improve awareness of AB. Continue skilled ST to maximize intellgibiltiy    Speech Therapy Frequency  2x / week    Treatment/Interventions  Oral motor exercises;SLP instruction and feedback;Environmental controls;Cueing hierarchy;Compensatory  strategies;Internal/external aids;Patient/family education    Potential to Achieve Goals  Good       Patient will benefit from skilled therapeutic intervention in order to improve the following deficits and impairments:   Dysarthria and anarthria  Problem List Patient Active Problem List   Diagnosis Date Noted  . Hemiparesis affecting left side as late effect of cerebrovascular accident (CVA) ([place]) 11/11/2016  . Gait disturbance, post-stroke 11/11/2016  . Dysarthria as late effect of stroke 11/11/2016  . Carotid artery stenosis 11/02/2016  . Right middle cerebral artery stroke ([place]) 10/19/2016  . Left arm weakness   . Diastolic dysfunction   . Hypertension with heart disease   . Tobacco abuse   . Acute blood loss anemia   . Cerebral infarction due to embolism of right middle cerebral artery ([place])   . Stroke (cerebrum) ([place]) 10/15/2016  . Perirectal abscess 09/26/2015  . Vitamin D deficiency 07/23/2015  . Osteopenia 02/16/2013  . Ankle fracture 01/19/2013  . Anemia 09/27/2011  . Uncontrolled type 2 diabetes mellitus with insulin therapy ([place]) 09/24/2011  . Hyperlipidemia 09/24/2011  . Hypertension 09/24/2011    [facility] ,[place], [place]  02/15/2017, 5:28 PM  [facility] [address] [place], [place], [address] Phone: [phone]   Fax:  [phone]   Name: Sarah [name] MRN: [id number] Date of Birth: December 05, 1945

## 2017-02-16 ENCOUNTER — Ambulatory Visit (INDEPENDENT_AMBULATORY_CARE_PROVIDER_SITE_OTHER): Payer: Medicare Other | Admitting: *Deleted

## 2017-02-16 DIAGNOSIS — I639 Cerebral infarction, unspecified: Secondary | ICD-10-CM

## 2017-02-17 ENCOUNTER — Ambulatory Visit: Payer: Medicare Other

## 2017-02-17 ENCOUNTER — Encounter: Payer: Medicare Other | Admitting: Occupational Therapy

## 2017-02-17 DIAGNOSIS — I69354 Hemiplegia and hemiparesis following cerebral infarction affecting left non-dominant side: Secondary | ICD-10-CM | POA: Diagnosis not present

## 2017-02-17 DIAGNOSIS — R4701 Aphasia: Secondary | ICD-10-CM | POA: Diagnosis not present

## 2017-02-17 DIAGNOSIS — R471 Dysarthria and anarthria: Secondary | ICD-10-CM | POA: Diagnosis not present

## 2017-02-17 DIAGNOSIS — I69318 Other symptoms and signs involving cognitive functions following cerebral infarction: Secondary | ICD-10-CM | POA: Diagnosis not present

## 2017-02-17 DIAGNOSIS — M6281 Muscle weakness (generalized): Secondary | ICD-10-CM | POA: Diagnosis not present

## 2017-02-17 DIAGNOSIS — R2681 Unsteadiness on feet: Secondary | ICD-10-CM | POA: Diagnosis not present

## 2017-02-17 DIAGNOSIS — R2689 Other abnormalities of gait and mobility: Secondary | ICD-10-CM | POA: Diagnosis not present

## 2017-02-17 DIAGNOSIS — R278 Other lack of coordination: Secondary | ICD-10-CM | POA: Diagnosis not present

## 2017-02-17 NOTE — Therapy (Signed)
[facility] [address] [place], [place], [address] Phone: [phone]   Fax:  [phone]  Speech Language Pathology Treatment  Patient Details  Name: Sarah [name] MRN: [id number] Date of Birth: 16-Jan-1945 Referring Provider: Alysia Penna, MD   Encounter Date: 02/17/2017  End of Session - 02/17/17 1625    Visit Number  8    Number of Visits  17    Date for SLP Re-Evaluation  04/01/17    SLP Start Time  0933    SLP Stop Time   1015    SLP Time Calculation (min)  42 min    Activity Tolerance  Patient tolerated treatment well       Past Medical History:  Diagnosis Date  . Boils   . Glaucoma   . Heart murmur   . HTN (hypertension)   . Hx of adenomatous colonic polyps   . Hyperlipidemia   . Osteoporosis   . Pneumonia   . Stroke ([place])   . Type II or unspecified type diabetes mellitus without mention of complication, uncontrolled     Past Surgical History:  Procedure Laterality Date  . ABDOMINAL HYSTERECTOMY    . CARPAL TUNNEL RELEASE     right  . COLONOSCOPY  12-10-10   per Dr. Deatra Ina, clear, repeat in 7 yrs   . INCISION AND DRAINAGE PERIRECTAL ABSCESS N/A 09/26/2015   Procedure: IRRIGATION AND DEBRIDEMENT PERIRECTAL ABSCESS;  Surgeon: Mickeal Skinner, MD;  Location: [facility];  Service: General;  Laterality: N/A;  . KNEE ARTHROSCOPY     right  . LOOP RECORDER INSERTION N/A 10/19/2016   Procedure: LOOP RECORDER INSERTION;  Surgeon: Thompson Grayer, MD;  Location: [facility];  Service: Cardiovascular;  Laterality: N/A;  . ORIF ANKLE FRACTURE Right 03/24/2012   Procedure: OPEN REDUCTION INTERNAL FIXATION (ORIF) ANKLE FRACTURE;  Surgeon: Newt Minion, MD;  Location: [facility];  Service: Orthopedics;  Laterality: Right;  Open Reduction Internal Fixation Right Bimalleolar ankle fracture  . POLYPECTOMY    . TEE WITHOUT CARDIOVERSION N/A 10/18/2016   Procedure: TRANSESOPHAGEAL ECHOCARDIOGRAM (TEE);   Surgeon: Fay Records, MD;  Location: [facility];  Service: Cardiovascular;  Laterality: N/A;  . TONSILLECTOMY      There were no vitals filed for this visit.  Subjective Assessment - 02/17/17 0935    Subjective  "(unintelligible) Tuesday."    Currently in Pain?  No/denies            ADULT SLP TREATMENT - 02/17/17 0937      General Information   Behavior/Cognition  Alert;Cooperative;Pleasant mood      Treatment Provided   Treatment provided  Cognitive-Linquistic      Cognitive-Linquistic Treatment   Treatment focused on  Dysarthria    Skilled Treatment  Pt arrived with 50% unintelligible utterance. When SLP told pt, she slowed rate and overarticulated in order to improve her intelligibility. Pt reports people asking her to repeat less than once a day. SLP used skilled questioning  to ascertain that her speech clarity was not the most pressing issue for pt today, but her anomia is troubling to her. Because of this, SLP then administered the [facility] to pt. Pt scored 41/60, which is outside WNL (cutoff=48/60).       Assessment / Recommendations / Plan   Plan  Goals updated pt wants SLP to remove speech, and add language goals      Progression Toward Goals   Progression toward goals  --  changed goals per pt request       SLP Education - 02/17/17 1624    Education provided  Yes    Education Details  results from [employer] naming test-2    [employer]) Educated  Patient    Methods  Explanation    Comprehension  Verbalized understanding       SLP Short Term Goals - 02/15/17 1727      SLP SHORT TERM GOAL #1   Title  pt will perform HEP with rare min A over three sessions    Time  1    Period  Weeks    Status  On-going      SLP SHORT TERM GOAL #2   Title  pt will perform respiratory muscle training exercises with modified independence over two sessions    Time  1    Period  Weeks    Status  On-going      SLP SHORT TERM GOAL #3   Title  pt will demo  compensatory strategies for intelligibility 70% of the time in 10 minutes simple conversation    Time  1    Period  Weeks    Status  On-going      SLP SHORT TERM GOAL #4   Title  pt will demo abdominal breathing 80% of the time in sentence responses x3 sessions    Time  1    Period  Weeks    Status  On-going       SLP Long Term Goals - 02/17/17 1628      SLP LONG TERM GOAL #1   Title  pt will use compensatory strategies for intelligibilty with modified independence in 10 minutes mod complex conversation x2 sessions    Status  Deferred      SLP LONG TERM GOAL #2   Title  pt will demo respiratory muscle training exercises with independence x2 sessions    Status  Deferred      SLP LONG TERM GOAL #3   Title  pt will perform HEP for dysarthria with modified independence x3 sessions    Status  Deferred      SLP LONG TERM GOAL #4   Title  pt will demo abdominal breathing functionally in 10 mintues mod complex conversation     Status  Deferred      SLP LONG TERM GOAL #5   Title  pt will use compensations for anomia in 10 minutes simple to mod compelx conversation functionally    Time  4 beginning week of 02-21-17    Period  Weeks (or 8 visits, for all LTGs)    Status  New      Additional Long Term Goals   Additional Long Term Goals  Yes      SLP LONG TERM GOAL #6   Title  pt will name 8 items in mod abstract/complex category with rare min A    Time  4    Period  Weeks    Status  New      SLP LONG TERM GOAL #7   Title  pt will complete simple to mod complex naming tasks with occasional min A over three sessions    Time  4    Period  Weeks    Status  New       Plan - 02/17/17 1626    Clinical Impression Statement  Pt continues with mild dysarthria in conversation, however pt reports word finding is her main concern at thist time, therefore  SLP performed [place] -2 today and found pt outside WNL for word finding. New goals for anomia were added per pt request.  Focus of skilled ST will now be to maximize ability to fluidly converse with improved word finding.     Speech Therapy Frequency  2x / week    Duration  4 weeks 8 more visits    Treatment/Interventions  Oral motor exercises;SLP instruction and feedback;Environmental controls;Cueing hierarchy;Compensatory strategies;Internal/external aids;Patient/family education    Potential to Achieve Goals  Good    Consulted and Agree with Plan of Care  Patient       Patient will benefit from skilled therapeutic intervention in order to improve the following deficits and impairments:   Aphasia  Dysarthria and anarthria  G-Codes - 13-Mar-2017 1631    Functional Assessment Tool Used  noms, clinical judgment    Functional Limitations  Motor speech    Motor Speech Goal Status [phone])  At least 1 percent but less than 20 percent impaired, limited or restricted    Motor Speech Goal Status ([id number])  At least 1 percent but less than 20 percent impaired, limited or restricted       Problem List Patient Active Problem List   Diagnosis Date Noted  . Hemiparesis affecting left side as late effect of cerebrovascular accident (CVA) ([place]) 11/11/2016  . Gait disturbance, post-stroke 11/11/2016  . Dysarthria as late effect of stroke 11/11/2016  . Carotid artery stenosis 11/02/2016  . Right middle cerebral artery stroke ([place]) 10/19/2016  . Left arm weakness   . Diastolic dysfunction   . Hypertension with heart disease   . Tobacco abuse   . Acute blood loss anemia   . Cerebral infarction due to embolism of right middle cerebral artery ([place])   . Stroke (cerebrum) ([place]) 10/15/2016  . Perirectal abscess 09/26/2015  . Vitamin D deficiency 07/23/2015  . Osteopenia 02/16/2013  . Ankle fracture 01/19/2013  . Anemia 09/27/2011  . Uncontrolled type 2 diabetes mellitus with insulin therapy ([place]) 09/24/2011  . Hyperlipidemia 09/24/2011  . Hypertension 09/24/2011    [facility] ,[place], [place]  2017/03/13, 4:33  PM  [facility] [address] [place], [place], [address] Phone: [phone]   Fax:  [phone]   Name: Sarah [name] MRN: [id number] Date of Birth: 04/21/1945

## 2017-02-17 NOTE — Progress Notes (Signed)
[facility] Summary Report / Loop Recorder 

## 2017-02-17 NOTE — Patient Instructions (Signed)
  Please complete the assigned speech therapy homework prior to your next session and return it to the speech therapist at your next visit.  

## 2017-02-21 ENCOUNTER — Other Ambulatory Visit: Payer: Self-pay | Admitting: Family Medicine

## 2017-02-21 ENCOUNTER — Encounter: Payer: Medicare Other | Admitting: Occupational Therapy

## 2017-02-22 ENCOUNTER — Encounter: Payer: Medicare Other | Admitting: Occupational Therapy

## 2017-02-22 ENCOUNTER — Ambulatory Visit: Payer: Medicare Other | Admitting: Physical Therapy

## 2017-02-24 ENCOUNTER — Ambulatory Visit: Payer: Medicare Other

## 2017-02-24 ENCOUNTER — Ambulatory Visit: Payer: Medicare Other | Admitting: Physical Therapy

## 2017-02-24 ENCOUNTER — Encounter: Payer: Self-pay | Admitting: Physical Therapy

## 2017-02-24 DIAGNOSIS — I69354 Hemiplegia and hemiparesis following cerebral infarction affecting left non-dominant side: Secondary | ICD-10-CM | POA: Diagnosis not present

## 2017-02-24 DIAGNOSIS — R4701 Aphasia: Secondary | ICD-10-CM

## 2017-02-24 DIAGNOSIS — M6281 Muscle weakness (generalized): Secondary | ICD-10-CM | POA: Diagnosis not present

## 2017-02-24 DIAGNOSIS — R278 Other lack of coordination: Secondary | ICD-10-CM | POA: Diagnosis not present

## 2017-02-24 DIAGNOSIS — I69318 Other symptoms and signs involving cognitive functions following cerebral infarction: Secondary | ICD-10-CM | POA: Diagnosis not present

## 2017-02-24 DIAGNOSIS — R471 Dysarthria and anarthria: Secondary | ICD-10-CM | POA: Diagnosis not present

## 2017-02-24 DIAGNOSIS — R2681 Unsteadiness on feet: Secondary | ICD-10-CM | POA: Diagnosis not present

## 2017-02-24 DIAGNOSIS — R2689 Other abnormalities of gait and mobility: Secondary | ICD-10-CM | POA: Diagnosis not present

## 2017-02-24 NOTE — Patient Instructions (Signed)
  Please complete the assigned speech therapy homework prior to your next session and return it to the speech therapist at your next visit.  

## 2017-02-24 NOTE — Therapy (Signed)
[facility] [address] [place], [place], [address] Phone: [phone]   Fax:  [phone]  Speech Language Pathology Treatment  Patient Details  Name: Sarah [name] MRN: [id number] Date of Birth: 10-Mar-1945 Referring Provider: Alysia Penna, MD   Encounter Date: 02/24/2017  End of Session - 02/24/17 1208    Visit Number  9    Number of Visits  17    Date for SLP Re-Evaluation  04/01/17    SLP Start Time  0850    SLP Stop Time   0931    SLP Time Calculation (min)  41 min    Activity Tolerance  Patient tolerated treatment well       Past Medical History:  Diagnosis Date  . Boils   . Glaucoma   . Heart murmur   . HTN (hypertension)   . Hx of adenomatous colonic polyps   . Hyperlipidemia   . Osteoporosis   . Pneumonia   . Stroke ([place])   . Type II or unspecified type diabetes mellitus without mention of complication, uncontrolled     Past Surgical History:  Procedure Laterality Date  . ABDOMINAL HYSTERECTOMY    . CARPAL TUNNEL RELEASE     right  . COLONOSCOPY  12-10-10   per Dr. Deatra Ina, clear, repeat in 7 yrs   . INCISION AND DRAINAGE PERIRECTAL ABSCESS N/A 09/26/2015   Procedure: IRRIGATION AND DEBRIDEMENT PERIRECTAL ABSCESS;  Surgeon: Mickeal Skinner, MD;  Location: [facility];  Service: General;  Laterality: N/A;  . KNEE ARTHROSCOPY     right  . LOOP RECORDER INSERTION N/A 10/19/2016   Procedure: LOOP RECORDER INSERTION;  Surgeon: Thompson Grayer, MD;  Location: [facility];  Service: Cardiovascular;  Laterality: N/A;  . ORIF ANKLE FRACTURE Right 03/24/2012   Procedure: OPEN REDUCTION INTERNAL FIXATION (ORIF) ANKLE FRACTURE;  Surgeon: Newt Minion, MD;  Location: [facility];  Service: Orthopedics;  Laterality: Right;  Open Reduction Internal Fixation Right Bimalleolar ankle fracture  . POLYPECTOMY    . TEE WITHOUT CARDIOVERSION N/A 10/18/2016   Procedure: TRANSESOPHAGEAL ECHOCARDIOGRAM (TEE);   Surgeon: Fay Records, MD;  [facility] ENDOSCOPY;  Service: Cardiovascular;  Laterality: N/A;  . TONSILLECTOMY      There were no vitals filed for this visit.  Subjective Assessment - 02/24/17 0927    Subjective  "I'm worried I won't remember where I am." (tearful)    Currently in Pain?  No/denies            ADULT SLP TREATMENT - 02/24/17 0932      General Information   Behavior/Cognition  Alert;Cooperative;Pleasant mood      Treatment Provided   Treatment provided  Cognitive-Linquistic      Cognitive-Linquistic Treatment   Treatment focused on  Dysarthria    Skilled Treatment  SLP worked with pt on synonyms and antonyms with SLP mod cues necessary usually. When pt was able to provide responses, they were usually semantically of a cursory nature, and not exact opposites or synonyms of stimulus word.       Assessment / Recommendations / Plan   Plan  Continue with current plan of care      Progression Toward Goals   Progression toward goals  Progressing toward goals         SLP Short Term Goals - 02/15/17 1727      SLP SHORT TERM GOAL #1   Title  pt will perform HEP with rare min A over  three sessions    Time  1    Period  Weeks    Status  On-going      SLP SHORT TERM GOAL #2   Title  pt will perform respiratory muscle training exercises with modified independence over two sessions    Time  1    Period  Weeks    Status  On-going      SLP SHORT TERM GOAL #3   Title  pt will demo compensatory strategies for intelligibility 70% of the time in 10 minutes simple conversation    Time  1    Period  Weeks    Status  On-going      SLP SHORT TERM GOAL #4   Title  pt will demo abdominal breathing 80% of the time in sentence responses x3 sessions    Time  1    Period  Weeks    Status  On-going       SLP Long Term Goals - 02/24/17 1209      SLP LONG TERM GOAL #1   Title  pt will use compensatory strategies for intelligibilty with modified independence in 10  minutes mod complex conversation x2 sessions    Status  Deferred      SLP LONG TERM GOAL #2   Title  pt will demo respiratory muscle training exercises with independence x2 sessions    Status  Deferred      SLP LONG TERM GOAL #3   Title  pt will perform HEP for dysarthria with modified independence x3 sessions    Status  Deferred      SLP LONG TERM GOAL #4   Title  pt will demo abdominal breathing functionally in 10 mintues mod complex conversation     Status  Deferred      SLP LONG TERM GOAL #5   Title  pt will use compensations for anomia in 10 minutes simple to mod compelx conversation functionally    Time  4 beginning week of 02-21-17    Period  Weeks (or 8 visits, for all LTGs)    Status  On-going      SLP LONG TERM GOAL #6   Title  pt will name 8 items in mod abstract/complex category with rare min A    Time  4    Period  Weeks    Status  On-going      SLP LONG TERM GOAL #7   Title  pt will complete simple to mod complex naming tasks with occasional min A over three sessions    Time  4    Period  Weeks    Status  On-going       Plan - 02/24/17 1208    Clinical Impression Statement  Pt continues with expressive aphasia c/b anomia and dysnomia in conversation, with rare pausing/hesitation. Skilled ST continues to be warranted to maximize ability to fluidly converse with improved word finding.     Speech Therapy Frequency  2x / week    Duration  4 weeks (8 more visits)    Treatment/Interventions  Oral motor exercises;SLP instruction and feedback;Environmental controls;Cueing hierarchy;Compensatory strategies;Internal/external aids;Patient/family education    Potential to Achieve Goals  Good       Patient will benefit from skilled therapeutic intervention in order to improve the following deficits and impairments:   Aphasia  Dysarthria and anarthria    Problem List Patient Active Problem List   Diagnosis Date Noted  . Hemiparesis affecting left side as late  effect of cerebrovascular accident (CVA) ([place]) 11/11/2016  . Gait disturbance, post-stroke 11/11/2016  . Dysarthria as late effect of stroke 11/11/2016  . Carotid artery stenosis 11/02/2016  . Right middle cerebral artery stroke ([place]) 10/19/2016  . Left arm weakness   . Diastolic dysfunction   . Hypertension with heart disease   . Tobacco abuse   . Acute blood loss anemia   . Cerebral infarction due to embolism of right middle cerebral artery ([place])   . Stroke (cerebrum) ([place]) 10/15/2016  . Perirectal abscess 09/26/2015  . Vitamin D deficiency 07/23/2015  . Osteopenia 02/16/2013  . Ankle fracture 01/19/2013  . Anemia 09/27/2011  . Uncontrolled type 2 diabetes mellitus with insulin therapy ([place]) 09/24/2011  . Hyperlipidemia 09/24/2011  . Hypertension 09/24/2011    [facility] ,[place], [place]  02/24/2017, 12:10 PM  [facility] [address] [place], [place], [address] Phone: [phone]   Fax:  [phone]   Name: Sarah [name] MRN: [id number] Date of Birth: Nov 03, 1945

## 2017-02-24 NOTE — Therapy (Signed)
[facility] [address] [place], [place], [address] Phone: [phone]   Fax:  [phone]  Physical Therapy Treatment  Patient Details  Name: Sarah [name] MRN: [id number] Date of Birth: 1945-02-17 Referring Provider: Alysia Penna MD   Encounter Date: 02/24/2017  PT End of Session - 02/24/17 0938    Visit Number  18    Number of Visits  31 recert 6/78/93    Date for PT Re-Evaluation  04/09/17    Authorization Type  [employer] Medicare    Authorization Time Period   02/08/17 to 04/09/17    PT Start Time  0934    PT Stop Time  1014    PT Time Calculation (min)  40 min    Equipment Utilized During Treatment  Gait belt    Activity Tolerance  Patient tolerated treatment well    Behavior During Therapy  [facility] for tasks assessed/performed       Past Medical History:  Diagnosis Date  . Boils   . Glaucoma   . Heart murmur   . HTN (hypertension)   . Hx of adenomatous colonic polyps   . Hyperlipidemia   . Osteoporosis   . Pneumonia   . Stroke ([place])   . Type II or unspecified type diabetes mellitus without mention of complication, uncontrolled     Past Surgical History:  Procedure Laterality Date  . ABDOMINAL HYSTERECTOMY    . CARPAL TUNNEL RELEASE     right  . COLONOSCOPY  12-10-10   per Dr. Deatra Ina, clear, repeat in 7 yrs   . INCISION AND DRAINAGE PERIRECTAL ABSCESS N/A 09/26/2015   Procedure: IRRIGATION AND DEBRIDEMENT PERIRECTAL ABSCESS;  Surgeon: Mickeal Skinner, MD;  Location: [facility];  Service: General;  Laterality: N/A;  . KNEE ARTHROSCOPY     right  . LOOP RECORDER INSERTION N/A 10/19/2016   Procedure: LOOP RECORDER INSERTION;  Surgeon: Thompson Grayer, MD;  Location: [facility];  Service: Cardiovascular;  Laterality: N/A;  . ORIF ANKLE FRACTURE Right 03/24/2012   Procedure: OPEN REDUCTION INTERNAL FIXATION (ORIF) ANKLE FRACTURE;  Surgeon: Newt Minion, MD;  Location: [facility];  Service:  Orthopedics;  Laterality: Right;  Open Reduction Internal Fixation Right Bimalleolar ankle fracture  . POLYPECTOMY    . TEE WITHOUT CARDIOVERSION N/A 10/18/2016   Procedure: TRANSESOPHAGEAL ECHOCARDIOGRAM (TEE);  Surgeon: Fay Records, MD;  Location: [facility] ENDOSCOPY;  Service: Cardiovascular;  Laterality: N/A;  . TONSILLECTOMY      There were no vitals filed for this visit.  Subjective Assessment - 02/24/17 0936    Subjective  No new complaints. No falls or pain to report.  Has appt with [employer] tomorrow at 1pm at [facility] [address] near her house.     Pertinent History  HTN, HLD, DM, neurop, tobacco, COPD, OP (lives w/ mom; sister came to stay)    Limitations  Standing;Walking    Patient Stated Goals  to improve the use of her left hand and walk without a walker    Currently in Pain?  No/denies    Pain Score  0-No pain             [facility] Adult PT Treatment/Exercise - 02/24/17 0942      High Level Balance   High Level Balance Activities  Marching forwards;Marching backwards;Tandem walking;Head turns    High Level Balance Comments  on both red mats: 3-4 laps each with no UE support, min guard to min assist  for balance with cues on posture, weight shifting and ex form/technique.       Neuro Re-ed    Neuro Re-ed Details   for mm strengthening/balance reactions: fwd gait while tossing ball and naming animals a-z with min guard assist for balance, cues to stay on task with cues for animal names ~50%; standing across back foam beam: alternating fwd stepping to floor/back onto beam, then alternating bwd stepping to floor/back onto beam x 10 reps each bil LE's with min to mod assist for balance, no UE support. cues needed for step length and height to clear beam surface on return to beam.       Knee/Hip Exercises: Aerobic   Tread Mill  x7 minutes with UE support at 1.5 mph, pt self adjusted speed up, otherwise had 2 UE support. min guard assist for balance/safety.             PT Short Term Goals - 12/31/16 1750      PT SHORT TERM GOAL #1   Title  Assess gait velocity and set STG and LTG as appropriate. (Target all STGs 12/08/16)    Baseline  11/16/16: met today with PT to set goals    Status  Achieved      PT SHORT TERM GOAL #2   Title  Patient will be independent with HEP for balance, strength, and gait training.     Time  4    Period  Weeks    Status  Achieved      PT SHORT TERM GOAL #3   Title  Patient will ambulate 250 ft on level/unlevel exterior surfaces with LRAD modified independent.     Baseline  12/07/16  250 ft x 2 level indoor surfaces due to weather    Time  4    Period  Weeks    Status  Unable to assess      PT SHORT TERM GOAL #4   Title  Patient will improve TUG to <15 seconds with LRAD.     Baseline  12/07/16 11.06 sec no device    Time  8    Period  Weeks    Status  Achieved      PT SHORT TERM GOAL #5   Title  Patient will complete FGA with goal set as appropriate. (Target date 01/20/17)    Time  3    Period  Weeks    Status  New    Target Date  01/20/17        PT Long Term Goals - 02/08/17 1438      PT LONG TERM GOAL #1   Title  see prior notes for eariler LTGs    Status  Achieved      PT LONG TERM GOAL #6   Title  Patient will ambulate with head turns over level, indoor surface while carrying two simulated bags of groceries of different weights x 200 ft modified independent. (Target date for LTGs 02/10/17)    Baseline  02/08/17 Ambulated 300 ft with two grocery bags and performing head turns without imbalance.     Time  6    Period  Weeks    Status  Achieved      PT LONG TERM GOAL #7   Title  Pateint will ambulate 500 ft over unlevel outdoor surfaces (grass, gravel, curbs, inclines) with no assistive device modified independent. (distance reduced due to winter season) Target for remaining LTGs 03/10/17)    Baseline  02/08/17 deferred due to raining  Time  --    Period  --    Status  Deferred    Target  Date  03/10/17      PT LONG TERM GOAL #8   Title  Patient will carry full laundry basket up/down 12 steps modified independent.     Baseline  02/08/17 minguard assist due to imbalance    Time  --    Period  --    Status  On-going    Target Date  03/10/17      PT LONG TERM GOAL  #9   TITLE  pateint will be independent with updated/final HEP and have a plan for continued community-based fitness program upon discharge from PT.     Baseline  02/08/17 independent with HEP; has not yet looked into [employer] program through her insurance--reports will do today[place]    Time  --    Period  --    Status  On-going    Target Date  03/10/17            Plan - 02/24/17 0939    Clinical Impression Statement  Today's skilled session continued to focus on use of the treadmill and high level balance activities. Pt continues to be challenged by complaint surfaces and multi-tasking activities. Pt should benefit from continued PT to progress toward unmet goals     Rehab Potential  Good    PT Frequency  1x / week updated for recert 9/56/21    PT Duration  4 weeks updated for recert 03/19/63    PT Treatment/Interventions  ADLs/Self Care Home Management;DME Instruction;Gait training;Stair training;Functional mobility training;Orthotic Fit/Training;Patient/family education;Neuromuscular re-education;Balance training;Therapeutic exercise;Therapeutic activities;Passive range of motion;Cognitive remediation    PT Next Visit Plan  use treadmill for warm-up and allow her to adjust the velocity (practice letting go of rail iwth one hand and maintain balance); continue to work on high level balance with emphasis on complaint surfaces/vision removed,     Consulted and Agree with Plan of Care  Patient       Patient will benefit from skilled therapeutic intervention in order to improve the following deficits and impairments:  Decreased balance, Decreased knowledge of use of DME,  Decreased mobility, Decreased strength, Difficulty walking, Impaired UE functional use, Impaired vision/preception, Obesity, Pain  Visit Diagnosis: Unsteadiness on feet  Muscle weakness (generalized)     Problem List Patient Active Problem List   Diagnosis Date Noted  . Hemiparesis affecting left side as late effect of cerebrovascular accident (CVA) ([place]) 11/11/2016  . Gait disturbance, post-stroke 11/11/2016  . Dysarthria as late effect of stroke 11/11/2016  . Carotid artery stenosis 11/02/2016  . Right middle cerebral artery stroke ([place]) 10/19/2016  . Left arm weakness   . Diastolic dysfunction   . Hypertension with heart disease   . Tobacco abuse   . Acute blood loss anemia   . Cerebral infarction due to embolism of right middle cerebral artery ([place])   . Stroke (cerebrum) ([place]) 10/15/2016  . Perirectal abscess 09/26/2015  . Vitamin D deficiency 07/23/2015  . Osteopenia 02/16/2013  . Ankle fracture 01/19/2013  . Anemia 09/27/2011  . Uncontrolled type 2 diabetes mellitus with insulin therapy ([place]) 09/24/2011  . Hyperlipidemia 09/24/2011  . Hypertension 09/24/2011    Willow Ora, PTA, [facility] [address], [place], [place] [address] [phone] 02/24/17, 10:31 PM   Name: Sarah [name] MRN: [id number] Date of Birth: [birth year]

## 2017-02-28 ENCOUNTER — Ambulatory Visit: Payer: Medicare Other | Admitting: Speech Pathology

## 2017-02-28 ENCOUNTER — Encounter: Payer: Self-pay | Admitting: Physical Therapy

## 2017-02-28 ENCOUNTER — Ambulatory Visit: Payer: Medicare Other | Admitting: Physical Therapy

## 2017-02-28 DIAGNOSIS — R471 Dysarthria and anarthria: Secondary | ICD-10-CM | POA: Diagnosis not present

## 2017-02-28 DIAGNOSIS — R2681 Unsteadiness on feet: Secondary | ICD-10-CM | POA: Diagnosis not present

## 2017-02-28 DIAGNOSIS — R278 Other lack of coordination: Secondary | ICD-10-CM | POA: Diagnosis not present

## 2017-02-28 DIAGNOSIS — R2689 Other abnormalities of gait and mobility: Secondary | ICD-10-CM | POA: Diagnosis not present

## 2017-02-28 DIAGNOSIS — M6281 Muscle weakness (generalized): Secondary | ICD-10-CM | POA: Diagnosis not present

## 2017-02-28 DIAGNOSIS — R4701 Aphasia: Secondary | ICD-10-CM | POA: Diagnosis not present

## 2017-02-28 DIAGNOSIS — I69354 Hemiplegia and hemiparesis following cerebral infarction affecting left non-dominant side: Secondary | ICD-10-CM | POA: Diagnosis not present

## 2017-02-28 DIAGNOSIS — I69318 Other symptoms and signs involving cognitive functions following cerebral infarction: Secondary | ICD-10-CM | POA: Diagnosis not present

## 2017-02-28 NOTE — Therapy (Signed)
[facility] [address] [place], [place], [address] Phone: [phone]   Fax:  [phone]  Speech Language Pathology Treatment  Patient Details  Name: Sarah [name] MRN: [id number] Date of Birth: 1945/10/09 Referring Provider: Alysia Penna, MD   Encounter Date: 02/28/2017  End of Session - 02/28/17 1341    Visit Number  10    Number of Visits  17    Date for SLP Re-Evaluation  04/01/17    SLP Start Time  1104    SLP Stop Time   1145    SLP Time Calculation (min)  41 min    Activity Tolerance  Patient tolerated treatment well       Past Medical History:  Diagnosis Date  . Boils   . Glaucoma   . Heart murmur   . HTN (hypertension)   . Hx of adenomatous colonic polyps   . Hyperlipidemia   . Osteoporosis   . Pneumonia   . Stroke ([place])   . Type II or unspecified type diabetes mellitus without mention of complication, uncontrolled     Past Surgical History:  Procedure Laterality Date  . ABDOMINAL HYSTERECTOMY    . CARPAL TUNNEL RELEASE     right  . COLONOSCOPY  12-10-10   per Dr. Deatra Ina, clear, repeat in 7 yrs   . INCISION AND DRAINAGE PERIRECTAL ABSCESS N/A 09/26/2015   Procedure: IRRIGATION AND DEBRIDEMENT PERIRECTAL ABSCESS;  Surgeon: Mickeal Skinner, MD;  Location: [facility];  Service: General;  Laterality: N/A;  . KNEE ARTHROSCOPY     right  . LOOP RECORDER INSERTION N/A 10/19/2016   Procedure: LOOP RECORDER INSERTION;  Surgeon: Thompson Grayer, MD;  Location: [facility];  Service: Cardiovascular;  Laterality: N/A;  . ORIF ANKLE FRACTURE Right 03/24/2012   Procedure: OPEN REDUCTION INTERNAL FIXATION (ORIF) ANKLE FRACTURE;  Surgeon: Newt Minion, MD;  Location: [facility];  Service: Orthopedics;  Laterality: Right;  Open Reduction Internal Fixation Right Bimalleolar ankle fracture  . POLYPECTOMY    . TEE WITHOUT CARDIOVERSION N/A 10/18/2016   Procedure: TRANSESOPHAGEAL ECHOCARDIOGRAM (TEE);   Surgeon: Fay Records, MD;  Location: [facility] ENDOSCOPY;  Service: Cardiovascular;  Laterality: N/A;  . TONSILLECTOMY      There were no vitals filed for this visit.  Subjective Assessment - 02/28/17 1104    Subjective  "[name] and I changed our focus... I couldn't make the word that I wanted come out."    Currently in Pain?  No/denies            ADULT SLP TREATMENT - 02/28/17 1104      General Information   Behavior/Cognition  Alert;Cooperative;Pleasant mood    Patient Positioning  Upright in chair      Treatment Provided   Treatment provided  Cognitive-Linquistic      Pain Assessment   Pain Assessment  No/denies pain      Cognitive-Linquistic Treatment   Treatment focused on  Aphasia    Skilled Treatment  SLP trained pt in compensations for anomia using language activity targeting wordfinding, description. Pt initially required occasional mod A question cues for to provide associations, physical descriptions and function. In word game activity, pt utilized descriptions to successfully convey less common nouns with 100% accuracy. SLP provided occasional min A question cues to elicit more specific and detailed descriptions.      Assessment / Recommendations / Plan   Plan  Continue with current plan of care  Progression Toward Goals   Progression toward goals  Progressing toward goals         SLP Short Term Goals - 02/28/17 1546      SLP SHORT TERM GOAL #1   Title  1    Time  1    Period  Weeks    Status  Deferred      SLP SHORT TERM GOAL #2   Title  pt will perform respiratory muscle training exercises with modified independence over two sessions    Time  1    Period  Weeks    Status  Deferred      SLP SHORT TERM GOAL #3   Title  pt will demo compensatory strategies for intelligibility 70% of the time in 10 minutes simple conversation    Time  1    Period  Weeks    Status  Deferred      SLP SHORT TERM GOAL #4   Title  pt will demo abdominal breathing 80%  of the time in sentence responses x3 sessions    Time  1    Period  Weeks    Status  Deferred       SLP Long Term Goals - 02/28/17 1330      SLP LONG TERM GOAL #1   Title  pt will use compensatory strategies for intelligibilty with modified independence in 10 minutes mod complex conversation x2 sessions    Status  Deferred      SLP LONG TERM GOAL #2   Title  pt will demo respiratory muscle training exercises with independence x2 sessions    Status  Deferred      SLP LONG TERM GOAL #3   Title  pt will perform HEP for dysarthria with modified independence x3 sessions    Status  Deferred      SLP LONG TERM GOAL #4   Title  pt will demo abdominal breathing functionally in 10 mintues mod complex conversation     Status  Deferred      SLP LONG TERM GOAL #5   Title  pt will use compensations for anomia in 10 minutes simple to mod compelx conversation functionally    Time  3    Period  Weeks    Status  On-going      SLP LONG TERM GOAL #6   Title  pt will name 8 items in mod abstract/complex category with rare min A    Time  3    Period  Weeks    Status  On-going       Plan - 02/28/17 1341    Clinical Impression Statement  Pt continues with expressive aphasia c/b anomia and dysnomia in conversation, with rare pausing/hesitation. Skilled ST continues to be warranted to maximize ability to fluidly converse with improved word finding.     Speech Therapy Frequency  2x / week    Duration  4 weeks    Treatment/Interventions  Oral motor exercises;SLP instruction and feedback;Environmental controls;Cueing hierarchy;Compensatory strategies;Internal/external aids;Patient/family education    Potential to Achieve Goals  Good    Consulted and Agree with Plan of Care  Patient       Patient will benefit from skilled therapeutic intervention in order to improve the following deficits and impairments:   Aphasia    Problem List Patient Active Problem List   Diagnosis Date Noted  .  Hemiparesis affecting left side as late effect of cerebrovascular accident (CVA) ([place]) 11/11/2016  . Gait disturbance, post-stroke  11/11/2016  . Dysarthria as late effect of stroke 11/11/2016  . Carotid artery stenosis 11/02/2016  . Right middle cerebral artery stroke ([place]) 10/19/2016  . Left arm weakness   . Diastolic dysfunction   . Hypertension with heart disease   . Tobacco abuse   . Acute blood loss anemia   . Cerebral infarction due to embolism of right middle cerebral artery ([place])   . Stroke (cerebrum) ([place]) 10/15/2016  . Perirectal abscess 09/26/2015  . Vitamin D deficiency 07/23/2015  . Osteopenia 02/16/2013  . Ankle fracture 01/19/2013  . Anemia 09/27/2011  . Uncontrolled type 2 diabetes mellitus with insulin therapy ([place]) 09/24/2011  . Hyperlipidemia 09/24/2011  . Hypertension 09/24/2011   Deneise Lever, [place], [place] 02/28/2017, 3:47 PM  [facility] [address] [place], [place], [address] Phone: [phone]   Fax:  [phone]   Name: CHESSIE NEUHARTH MRN: [id number] Date of Birth: 16-Jun-1945

## 2017-02-28 NOTE — Therapy (Signed)
[facility] [address] [place], [place], [address] Phone: [phone]   Fax:  [phone]  Physical Therapy Treatment  Patient Details  Name: Sarah [name] MRN: [id number] Date of Birth: 1945-11-23 Referring Provider: Alysia Penna MD   Encounter Date: 02/28/2017  PT End of Session - 02/28/17 1315    Visit Number  19    Number of Visits  31 recert 9/40/76    Date for PT Re-Evaluation  04/09/17    Authorization Type  [employer] Medicare    Authorization Time Period   02/08/17 to 04/09/17    PT Start Time  1150    PT Stop Time  1235    PT Time Calculation (min)  45 min    Activity Tolerance  Patient tolerated treatment well    Behavior During Therapy  [facility] for tasks assessed/performed       Past Medical History:  Diagnosis Date  . Boils   . Glaucoma   . Heart murmur   . HTN (hypertension)   . Hx of adenomatous colonic polyps   . Hyperlipidemia   . Osteoporosis   . Pneumonia   . Stroke ([place])   . Type II or unspecified type diabetes mellitus without mention of complication, uncontrolled     Past Surgical History:  Procedure Laterality Date  . ABDOMINAL HYSTERECTOMY    . CARPAL TUNNEL RELEASE     right  . COLONOSCOPY  12-10-10   per Dr. Deatra Ina, clear, repeat in 7 yrs   . INCISION AND DRAINAGE PERIRECTAL ABSCESS N/A 09/26/2015   Procedure: IRRIGATION AND DEBRIDEMENT PERIRECTAL ABSCESS;  Surgeon: Mickeal Skinner, MD;  Location: [facility];  Service: General;  Laterality: N/A;  . KNEE ARTHROSCOPY     right  . LOOP RECORDER INSERTION N/A 10/19/2016   Procedure: LOOP RECORDER INSERTION;  Surgeon: Thompson Grayer, MD;  Location: [facility];  Service: Cardiovascular;  Laterality: N/A;  . ORIF ANKLE FRACTURE Right 03/24/2012   Procedure: OPEN REDUCTION INTERNAL FIXATION (ORIF) ANKLE FRACTURE;  Surgeon: Newt Minion, MD;  Location: [facility];  Service: Orthopedics;  Laterality: Right;  Open Reduction Internal  Fixation Right Bimalleolar ankle fracture  . POLYPECTOMY    . TEE WITHOUT CARDIOVERSION N/A 10/18/2016   Procedure: TRANSESOPHAGEAL ECHOCARDIOGRAM (TEE);  Surgeon: Fay Records, MD;  Location: [facility] ENDOSCOPY;  Service: Cardiovascular;  Laterality: N/A;  . TONSILLECTOMY      There were no vitals filed for this visit.  Subjective Assessment - 02/28/17 1155    Subjective  Got the OK to go to [facility] with her family the last oF April.     Pertinent History  HTN, HLD, DM, neurop, tobacco, COPD, OP (lives w/ mom; sister came to stay)    Limitations  Standing;Walking    Patient Stated Goals  to improve the use of her left hand and walk without a walker    Currently in Pain?  No/denies                      [facility] Adult PT Treatment/Exercise - 02/28/17 1302      Ambulation/Gait   Ambulation/Gait Assistance  6: Modified independent (Device/Increase time);5: Supervision    Ambulation/Gait Assistance Details  modified independent due to slow pace and some drifting off path; vc to maintain increased velocity; cognitive challenges alternated with tossing ball between lt and rt hands; pt required INCR time for cognitive tasks however did not stop walking to think; changes  in direction, sudden stops with slight imbalance but no assist required    Ambulation Distance (Feet)  -- 2000+    Assistive device  None    Gait Pattern  Step-through pattern;Decreased stride length;Poor foot clearance - left;Decreased dorsiflexion - left    Ambulation Surface  Indoor    Stairs Assistance Details (indicate cue type and reason)  pt reports she does not plan to carry her laundry basket up/down steps due to fear of LOB; she has method placing basket up one step at a time and "bumping" basket down one step at a time          Balance Exercises - 02/28/17 1306      Balance Exercises: Standing   Standing Eyes Opened  Narrow base of support (BOS);Foam/compliant surface up/down facing on ramp    Standing  Eyes Closed  Wide (BOA);Head turns;Foam/compliant surface up/down facing on ramp with blue mat; marching    SLS with Vectors  Solid surface;Foam/compliant surface alternating steps and cone taps on floor, red, blue mat     Rockerboard  Anterior/posterior;EO;EC;10 seconds;30 seconds;Intermittent UE support large rocker board; semi-tandem stance    Retro Gait  -- intermixed with cognitive challenges    Marching Limitations  forwards/backwards up/down-facing on ramp, blue mat        PT Education - 02/28/17 1310    Education provided  Yes    Education Details  pt able to verbalize how she gets up/down from the floor and denies difficulty "I was on the floor this morning looking for my shoe under my bed";     Person(s) Educated  Patient    Methods  Explanation    Comprehension  Verbalized understanding       PT Short Term Goals - 12/31/16 1750      PT SHORT TERM GOAL #1   Title  Assess gait velocity and set STG and LTG as appropriate. (Target all STGs 12/08/16)    Baseline  11/16/16: met today with PT to set goals    Status  Achieved      PT SHORT TERM GOAL #2   Title  Patient will be independent with HEP for balance, strength, and gait training.     Time  4    Period  Weeks    Status  Achieved      PT SHORT TERM GOAL #3   Title  Patient will ambulate 250 ft on level/unlevel exterior surfaces with LRAD modified independent.     Baseline  12/07/16  250 ft x 2 level indoor surfaces due to weather    Time  4    Period  Weeks    Status  Unable to assess      PT SHORT TERM GOAL #4   Title  Patient will improve TUG to <15 seconds with LRAD.     Baseline  12/07/16 11.06 sec no device    Time  8    Period  Weeks    Status  Achieved      PT SHORT TERM GOAL #5   Title  Patient will complete FGA with goal set as appropriate. (Target date 01/20/17)    Time  3    Period  Weeks    Status  New    Target Date  01/20/17        PT Long Term Goals - 02/08/17 1438      PT LONG TERM  GOAL #1   Title  see prior notes for eariler LTGs  Status  Achieved      PT LONG TERM GOAL #6   Title  Patient will ambulate with head turns over level, indoor surface while carrying two simulated bags of groceries of different weights x 200 ft modified independent. (Target date for LTGs 02/10/17)    Baseline  02/08/17 Ambulated 300 ft with two grocery bags and performing head turns without imbalance.     Time  6    Period  Weeks    Status  Achieved      PT LONG TERM GOAL #7   Title  Pateint will ambulate 500 ft over unlevel outdoor surfaces (grass, gravel, curbs, inclines) with no assistive device modified independent. (distance reduced due to winter season) Target for remaining LTGs 03/10/17)    Baseline  02/08/17 deferred due to raining    Time  --    Period  --    Status  Deferred    Target Date  03/10/17      PT LONG TERM GOAL #8   Title  Patient will carry full laundry basket up/down 12 steps modified independent.     Baseline  02/08/17 minguard assist due to imbalance    Time  --    Period  --    Status  On-going    Target Date  03/10/17      PT LONG TERM GOAL  #9   TITLE  pateint will be independent with updated/final HEP and have a plan for continued community-based fitness program upon discharge from PT.     Baseline  02/08/17 independent with HEP; has not yet looked into silver sneakers program through her insurance--reports will do today[place]    Time  --    Period  --    Status  On-going    Target Date  03/10/17            Plan - 02/28/17 1316    Clinical Impression Statement  Patient better able to maintain incr gait velocity with vc's and demonstrated improved balance (staying on path) and increased arm swing. Gait velocity significantly decreases with cognitive challenges. Improved balance on more firm red mat, however remains very challenged by blue mat (at times required min assist to prevent fall). Pt can continue to benefit from PT  to achieve LTGs.     Rehab Potential  Good    PT Frequency  1x / week updated for recert 5/00/93    PT Duration  4 weeks updated for recert 08/29/27    PT Treatment/Interventions  ADLs/Self Care Home Management;DME Instruction;Gait training;Stair training;Functional mobility training;Orthotic Fit/Training;Patient/family education;Neuromuscular re-education;Balance training;Therapeutic exercise;Therapeutic activities;Passive range of motion;Cognitive remediation    PT Next Visit Plan  continue to work on high level balance with emphasis on complaint surfaces (esp blue mat)/vision removed; gait training with maintaining incr velocity and completing cognitive tasks     Consulted and Agree with Plan of Care  Patient       Patient will benefit from skilled therapeutic intervention in order to improve the following deficits and impairments:  Decreased balance, Decreased knowledge of use of DME, Decreased mobility, Decreased strength, Difficulty walking, Impaired UE functional use, Impaired vision/preception, Obesity, Pain  Visit Diagnosis: Unsteadiness on feet  Other abnormalities of gait and mobility     Problem List Patient Active Problem List   Diagnosis Date Noted  . Hemiparesis affecting left side as late effect of cerebrovascular accident (CVA) ([place]) 11/11/2016  . Gait disturbance, post-stroke 11/11/2016  .  Dysarthria as late effect of stroke 11/11/2016  . Carotid artery stenosis 11/02/2016  . Right middle cerebral artery stroke ([place]) 10/19/2016  . Left arm weakness   . Diastolic dysfunction   . Hypertension with heart disease   . Tobacco abuse   . Acute blood loss anemia   . Cerebral infarction due to embolism of right middle cerebral artery ([place])   . Stroke (cerebrum) ([place]) 10/15/2016  . Perirectal abscess 09/26/2015  . Vitamin D deficiency 07/23/2015  . Osteopenia 02/16/2013  . Ankle fracture 01/19/2013  . Anemia 09/27/2011  . Uncontrolled type 2 diabetes mellitus with  insulin therapy ([place]) 09/24/2011  . Hyperlipidemia 09/24/2011  . Hypertension 09/24/2011    Rexanne Mano, PT 02/28/2017, 1:21 PM  [facility] [address] [place], [place], [address] Phone: [phone]   Fax:  [phone]  Name: IVON ROEDEL MRN: [id number] Date of Birth: 05/20/1945

## 2017-03-01 ENCOUNTER — Ambulatory Visit: Payer: Medicare Other | Admitting: Physical Therapy

## 2017-03-03 ENCOUNTER — Ambulatory Visit: Payer: Medicare Other | Admitting: Speech Pathology

## 2017-03-03 NOTE — Therapy (Signed)
Pt arrived for visit and was notably upset. She told SLP she had another death in her family last night and felt she should have canceled her appointment. Held therapy today at pt's request.  Deneise Lever, [place], [facility] [address] [place], [place], [address] Phone: [phone]   Fax:  [phone]  Patient Details  Name: Sarah [name] MRN: [id number] Date of Birth: 12-19-1945 Referring Provider:  [name], [name], MD  Encounter Date: 03/03/2017   Aliene Altes 03/03/2017, 5:16 PM  [facility] [address] [place], [place], [address] Phone: [phone]   Fax:  [phone]

## 2017-03-07 ENCOUNTER — Ambulatory Visit: Payer: Medicare Other | Admitting: Physical Therapy

## 2017-03-07 ENCOUNTER — Ambulatory Visit: Payer: Medicare Other

## 2017-03-08 ENCOUNTER — Ambulatory Visit: Payer: Medicare Other | Admitting: Physical Therapy

## 2017-03-08 NOTE — Progress Notes (Signed)
[facility]  PATIENT: Sarah [name] DOB: [birth year]   REASON FOR VISIT: Follow-up for stroke right MCA 10/2016 HISTORY FROM: Patient    HISTORY OF PRESENT ILLNESS: Sarah [name] a 72 y.o.femalewith history of DM, ongoing tobacco use, HLD, HTN admitted on 10/15/16 for left-sided weakness.  CT head showed focal hypodensity in the posterior right frontal WM deep white matter.  MRI showed multifocal acute infarct within the right MCA. MRA head distal right MCA M1 segment conclusion.  Carotid Doppler 40-59% right ICA stenosis.  However, CT of neck unremarkable.  EF 55-60%.  10/18/16 with worsening left sided weakness,  repeat MRI showed no significant change.  TEE unremarkable and loop recorder placed.  LDL 98 and A1c 12.9.  She was put on dual antiplatelet and Lipitor 40 on discharged to CIR.  Interval History 12/07/16 Dr Erlinda Hong During the interval time, the patient has been doing well.  Left sided weakness much improved.  Still has OT/PT/speech therapy as outpatient.  Has quit smoking.  BP still on the high side today in clinic 151/78.  Glucose not in good control, ranging from 200-270.  Patient has PCP follow-up tomorrow.  Patient complains of intermittent double vision, not sure if binocular or monocular.  Request for driving privilege. UPDATE 2/27/2019CM Sarah [name], 72 year old female returns for follow-up with history of stroke in October 2018.  Her left-sided weakness is much improved however she continues with speech therapy occupational therapy and physical therapy.  She is no longer smoking.  She remains on Plavix for secondary stroke prevention without further stroke or TIA symptoms.  She has minimal bruising and no bleeding.  She is on Lipitor without myalgias.  Blood pressure in the office 130/75.  Blood sugars continue to be a problem CBGs range 199- 200 she claims she is compliant with her insulin.  She is back to driving during the day.  She is  due for a dental procedure in April.  Loop recorder so far no atrial fibrillation.  She returns for reevaluation without new neurologic symptoms REVIEW OF SYSTEMS: Full 14 system review of systems performed and notable only for those listed, all others are neg:  Constitutional: neg  Cardiovascular: neg Ear/Nose/Throat: neg  Skin: neg Eyes: neg Respiratory: neg Gastroitestinal: neg  Hematology/Lymphatic: neg  Endocrine: neg Musculoskeletal:neg Allergy/Immunology: neg Neurological: Speech difficulty Psychiatric: neg Sleep : neg   ALLERGIES: No Known Allergies  HOME MEDICATIONS: Outpatient Medications Prior to Visit  Medication Sig Dispense Refill  . amLODipine (NORVASC) 5 MG tablet Take 1 tablet (5 mg total) by mouth daily. 90 tablet 1  . atorvastatin (LIPITOR) 40 MG tablet Take 1 tablet (40 mg total) by mouth daily. 90 tablet 1  . clopidogrel (PLAVIX) 75 MG tablet TAKE 1 TABLET BY MOUTH DAILY 90 tablet 2  . ibuprofen (ADVIL,MOTRIN) 800 MG tablet TK 1 T PO Q 6 H PRN P  1  . Insulin Glargine (LANTUS) 100 UNIT/ML Solostar Pen 15 U in the morning and 20 U pm. 15 mL 3  . Insulin Pen Needle (BD PEN NEEDLE NANO U/F) 32G X 4 MM MISC USE UP TO FOUR TIMES DAILY 200 each 0  . TRAVATAN Z 0.004 % SOLN ophthalmic solution Place 1 drop into both eyes at bedtime.  11  . Vitamin D, Ergocalciferol, (DRISDOL) 50000 units CAPS capsule TAKE ONE CAPSULE BY MOUTH TWICE A WEEK ON MONDAY AND THURSDAY 8 capsule 2  . aspirin EC 325 MG tablet TAKE 1 TABLET BY MOUTH DAILY  30 tablet 0   No facility-administered medications prior to visit.     PAST MEDICAL HISTORY: Past Medical History:  Diagnosis Date  . Boils   . Glaucoma   . Heart murmur   . HTN (hypertension)   . Hx of adenomatous colonic polyps   . Hyperlipidemia   . Osteoporosis   . Pneumonia   . Stroke ([place])   . Type II or unspecified type diabetes mellitus without mention of complication, uncontrolled     PAST SURGICAL HISTORY: Past  Surgical History:  Procedure Laterality Date  . ABDOMINAL HYSTERECTOMY    . CARPAL TUNNEL RELEASE     right  . COLONOSCOPY  12-10-10   per Dr. Deatra Ina, clear, repeat in 7 yrs   . INCISION AND DRAINAGE PERIRECTAL ABSCESS N/A 09/26/2015   Procedure: IRRIGATION AND DEBRIDEMENT PERIRECTAL ABSCESS;  Surgeon: Mickeal Skinner, MD;  Location: [facility];  Service: General;  Laterality: N/A;  . KNEE ARTHROSCOPY     right  . LOOP RECORDER INSERTION N/A 10/19/2016   Procedure: LOOP RECORDER INSERTION;  Surgeon: Thompson Grayer, MD;  Location: [facility];  Service: Cardiovascular;  Laterality: N/A;  . ORIF ANKLE FRACTURE Right 03/24/2012   Procedure: OPEN REDUCTION INTERNAL FIXATION (ORIF) ANKLE FRACTURE;  Surgeon: Newt Minion, MD;  Location: [facility];  Service: Orthopedics;  Laterality: Right;  Open Reduction Internal Fixation Right Bimalleolar ankle fracture  . POLYPECTOMY    . TEE WITHOUT CARDIOVERSION N/A 10/18/2016   Procedure: TRANSESOPHAGEAL ECHOCARDIOGRAM (TEE);  Surgeon: Fay Records, MD;  Location: [facility] ENDOSCOPY;  Service: Cardiovascular;  Laterality: N/A;  . TONSILLECTOMY      FAMILY HISTORY: Family History  Problem Relation Age of Onset  . Diabetes Mother   . Hypertension Mother   . Stroke Father   . Stroke Maternal Uncle   . Stroke Paternal Uncle   . Colon cancer Neg Hx   . Esophageal cancer Neg Hx   . Rectal cancer Neg Hx   . Stomach cancer Neg Hx     SOCIAL HISTORY: Social History   Socioeconomic History  . Marital status: Single    Spouse name: Not on file  . Number of children: Not on file  . Years of education: Not on file  . Highest education level: Not on file  Social Needs  . Financial resource strain: Not on file  . Food insecurity - worry: Not on file  . Food insecurity - inability: Not on file  . Transportation needs - medical: Not on file  . Transportation needs - non-medical: Not on file  Occupational History  . Not on file  Tobacco Use  . Smoking  status: Former Smoker    Types: Cigarettes    Last attempt to quit: 10/15/2016    Years since quitting: 0.3  . Smokeless tobacco: Never Used  . Tobacco comment: smokes occ.   Substance and Sexual Activity  . Alcohol use: Yes    Alcohol/week: 0.0 oz    Comment: occ  . Drug use: No  . Sexual activity: Not on file  Other Topics Concern  . Not on file  Social History Narrative  . Not on file     PHYSICAL EXAM  Vitals:   03/09/17 1510  BP: 130/75  Pulse: 86  Weight: 138 lb 3.2 oz (62.7 kg)  Height: 5' (1.524 m)   Body mass index is 26.99 kg/m.  Generalized: Well developed, in no acute distress  Head: normocephalic and atraumatic,. Oropharynx benign  Neck: Supple, no carotid bruits  Cardiac: Regular rate rhythm, no murmur  Musculoskeletal: No deformity   Neurological examination   Mentation: Alert oriented to time, place, history taking. Attention span and concentration appropriate. Recent and remote memory intact.  Follows all commands speech and language fluent.   Cranial nerve II-XII: Fundoscopic exam reveals sharp disc margins.Pupils were equal round reactive to light extraocular movements were full, visual field were full on confrontational test.  Mild left facial droop . hearing was intact to finger rubbing bilaterally. Uvula tongue midline. head turning and shoulder shrug were normal and symmetric.Tongue protrusion into cheek strength was normal. Motor: normal bulk and tone, full strength in the BUE, BLE, except mild left upper extremity proximal weakness . Sensory: normal and symmetric to light touch, pinprick, and  Vibration, in the upper and lower extremities Coordination: finger-nose-finger, heel-to-shin bilaterally, no dysmetria, no tremor Reflexes: 1+ upper lower and symmetric plantar responses were flexor bilaterally. Gait and Station: Rising up from seated position without assistance, normal stance,  moderate stride, good arm swing, smooth turning, able to  perform tiptoe, and heel walking without difficulty. Tandem gait is unsteady.  No assistive device  DIAGNOSTIC DATA (LABS, IMAGING, TESTING) - I reviewed patient records, labs, notes, testing and imaging myself where available.  Lab Results  Component Value Date   WBC 5.5 12/08/2016   HGB 13.9 01/14/2017   HCT 41.0 01/14/2017   MCV 94.6 12/08/2016   PLT 307.0 12/08/2016      Component Value Date/Time   NA 141 01/14/2017 1221   K 4.0 01/14/2017 1221   CL 105 01/14/2017 1221   CO2 29 12/08/2016 1144   GLUCOSE 313 (H) 01/14/2017 1221   BUN 11 01/14/2017 1221   CREATININE 0.50 01/14/2017 1221   CALCIUM 9.6 12/08/2016 1144   PROT 5.7 (L) 10/19/2016 2353   ALBUMIN 2.7 (L) 10/19/2016 2353   AST 14 (L) 10/19/2016 2353   ALT 9 (L) 10/19/2016 2353   ALKPHOS 63 10/19/2016 2353   BILITOT 0.4 10/19/2016 2353   GFRNONAA >60 10/19/2016 2353   GFRAA >60 10/19/2016 2353   Lab Results  Component Value Date   CHOL 156 10/16/2016   HDL 37 (L) 10/16/2016   LDLCALC 98 10/16/2016   TRIG 104 10/16/2016   CHOLHDL 4.2 10/16/2016   Lab Results  Component Value Date   HGBA1C 12.9 (H) 10/16/2016        ASSESSMENT AND PLAN 72 y.o. African American female with PMH of DM, ongoing tobacco use, HLD, HTN admitted on 10/15/16 for multifocal acute infarct within the right MCA. MRA head distal right MCA M1 segment conclusion.  Carotid Doppler 40-59% right ICA stenosis.  However, CT of neck unremarkable.  EF 55-60%.  10/18/16 with worsening left sided weakness,  repeat MRI showed no significant change.  TEE unremarkable and loop recorder placed.  LDL 98 and A1c 12.9.  She was put on dual antiplatelet and Lipitor 40 on discharged to CIR. Left sided weakness much improved.  Still has OT/PT/speech therapy as outpatient.  Has quit smoking.  BP in good control. Glucose not in good control.  Loop recorder so far no afib.. The patient is a current patient of Dr. Erlinda Hong  who is out of the office today . This note is  sent to the work in doctor.     Plan:  Continue plavix for stroke prevention Continue lipitor for stroke prevention Check BP and glucose at home and record Today s reading 130/75 continue meds Follow  up with your primary care physician for stroke risk factor modification. Recommend maintain blood pressure goal <130/80, diabetes with hemoglobin A1c goal below 7.0% and lipids with LDL cholesterol goal below 70 mg/dL.  Loop recorder so far no atrial fib Continue PT/OT/speech therapy, then HEP Diabetic diet and regular exercise Follow up in 6 months if stable will discharge I spent 25 minutes in total face to face time with the patient more than 50% of which was spent counseling and coordination of care, reviewing test results reviewing medications and discussing and reviewing the diagnosis of stroke and management of risk factors and further treatment options.  Praised  for continuing smoking cessation. Dennie Bible, [facility], [facility], APRN  [facility] [address], [place], [place] [address] [phone]

## 2017-03-09 ENCOUNTER — Ambulatory Visit: Payer: Medicare Other | Admitting: Nurse Practitioner

## 2017-03-09 ENCOUNTER — Encounter: Payer: Self-pay | Admitting: Nurse Practitioner

## 2017-03-09 ENCOUNTER — Ambulatory Visit: Payer: Medicare Other | Admitting: Family Medicine

## 2017-03-09 VITALS — BP 130/75 | HR 86 | Ht 60.0 in | Wt 138.2 lb

## 2017-03-09 DIAGNOSIS — I63511 Cerebral infarction due to unspecified occlusion or stenosis of right middle cerebral artery: Secondary | ICD-10-CM | POA: Diagnosis not present

## 2017-03-09 DIAGNOSIS — I69398 Other sequelae of cerebral infarction: Secondary | ICD-10-CM

## 2017-03-09 DIAGNOSIS — I10 Essential (primary) hypertension: Secondary | ICD-10-CM | POA: Diagnosis not present

## 2017-03-09 DIAGNOSIS — E785 Hyperlipidemia, unspecified: Secondary | ICD-10-CM

## 2017-03-09 DIAGNOSIS — R269 Unspecified abnormalities of gait and mobility: Secondary | ICD-10-CM

## 2017-03-09 DIAGNOSIS — R29898 Other symptoms and signs involving the musculoskeletal system: Secondary | ICD-10-CM

## 2017-03-09 NOTE — Patient Instructions (Signed)
Continue plavix for stroke prevention Continue lipitor for stroke prevention Check BP and glucose at home and record Today s reading 130/75 continue meds Follow up with your primary care physician for stroke risk factor modification. Recommend maintain blood pressure goal <130/80, diabetes with hemoglobin A1c goal below 7.0% and lipids with LDL cholesterol goal below 70 mg/dL.  Loop recorder so far no atrial fib Continue PT/OT/speech therapy, then HEP Diabetic diet and regular exercise Follow up in 6 months

## 2017-03-10 ENCOUNTER — Ambulatory Visit: Payer: Medicare Other

## 2017-03-10 DIAGNOSIS — R471 Dysarthria and anarthria: Secondary | ICD-10-CM | POA: Diagnosis not present

## 2017-03-10 DIAGNOSIS — I69354 Hemiplegia and hemiparesis following cerebral infarction affecting left non-dominant side: Secondary | ICD-10-CM | POA: Diagnosis not present

## 2017-03-10 DIAGNOSIS — M6281 Muscle weakness (generalized): Secondary | ICD-10-CM | POA: Diagnosis not present

## 2017-03-10 DIAGNOSIS — R4701 Aphasia: Secondary | ICD-10-CM

## 2017-03-10 DIAGNOSIS — R2689 Other abnormalities of gait and mobility: Secondary | ICD-10-CM | POA: Diagnosis not present

## 2017-03-10 DIAGNOSIS — I69318 Other symptoms and signs involving cognitive functions following cerebral infarction: Secondary | ICD-10-CM | POA: Diagnosis not present

## 2017-03-10 DIAGNOSIS — R2681 Unsteadiness on feet: Secondary | ICD-10-CM | POA: Diagnosis not present

## 2017-03-10 DIAGNOSIS — R278 Other lack of coordination: Secondary | ICD-10-CM | POA: Diagnosis not present

## 2017-03-10 NOTE — Progress Notes (Signed)
I have read the note, and I agree with the clinical assessment and plan.  Richard A. Sater, MD, PhD, FAAN Certified in Neurology, Clinical Neurophysiology, Sleep Medicine, Pain Medicine and Neuroimaging  [facility] [address], [address] [place], [place] [address] [phone]  

## 2017-03-10 NOTE — Patient Instructions (Signed)
  Please complete the assigned speech therapy homework prior to your next session and return it to the speech therapist at your next visit.  

## 2017-03-10 NOTE — Therapy (Signed)
[facility] [address] [place], [place], [address] Phone: [phone]   Fax:  [phone]  Speech Language Pathology Treatment  Patient Details  Name: Sarah [name] MRN: [id number] Date of Birth: 09/30/1945 Referring Provider: Alysia Penna, MD   Encounter Date: 03/10/2017  End of Session - 03/10/17 1058    Visit Number  11    Number of Visits  17    Date for SLP Re-Evaluation  04/01/17    SLP Start Time  0932    SLP Stop Time   1015    SLP Time Calculation (min)  43 min    Activity Tolerance  Patient tolerated treatment well       Past Medical History:  Diagnosis Date  . Boils   . Glaucoma   . Heart murmur   . HTN (hypertension)   . Hx of adenomatous colonic polyps   . Hyperlipidemia   . Osteoporosis   . Pneumonia   . Stroke ([place])   . Type II or unspecified type diabetes mellitus without mention of complication, uncontrolled     Past Surgical History:  Procedure Laterality Date  . ABDOMINAL HYSTERECTOMY    . CARPAL TUNNEL RELEASE     right  . COLONOSCOPY  12-10-10   per Dr. Deatra [name], clear, repeat in 7 yrs   . INCISION AND DRAINAGE PERIRECTAL ABSCESS N/A 09/26/2015   Procedure: IRRIGATION AND DEBRIDEMENT PERIRECTAL ABSCESS;  Surgeon: Sarah Skinner, MD;  Location: [facility];  Service: General;  Laterality: N/A;  . KNEE ARTHROSCOPY     right  . LOOP RECORDER INSERTION N/A 10/19/2016   Procedure: LOOP RECORDER INSERTION;  Surgeon: Sarah Grayer, MD;  Location: [facility];  Service: Cardiovascular;  Laterality: N/A;  . ORIF ANKLE FRACTURE Right 03/24/2012   Procedure: OPEN REDUCTION INTERNAL FIXATION (ORIF) ANKLE FRACTURE;  Surgeon: Sarah Minion, MD;  Location: [facility];  Service: Orthopedics;  Laterality: Right;  Open Reduction Internal Fixation Right Bimalleolar ankle fracture  . POLYPECTOMY    . TEE WITHOUT CARDIOVERSION N/A 10/18/2016   Procedure: TRANSESOPHAGEAL ECHOCARDIOGRAM (TEE);   Surgeon: Sarah Records, MD;  Location: [facility];  Service: Cardiovascular;  Laterality: N/A;  . TONSILLECTOMY      There were no vitals filed for this visit.  Subjective Assessment - 03/10/17 1010    Subjective  "I just couldn't do it last time, Sarah [name]."            ADULT SLP TREATMENT - 03/10/17 1010      General Information   Behavior/Cognition  Alert;Cooperative;Pleasant mood      Treatment Provided   Treatment provided  Cognitive-Linquistic      Pain Assessment   Pain Assessment  No/denies pain      Cognitive-Linquistic Treatment   Treatment focused on  Aphasia    Skilled Treatment  SLP assisted pt complete more of her homework, which she did not complete due to family obligations since last visit. She req'd mod SLP cues for 2/4 empty responses. SLP encouraged pt to set her homework aside for at least an hour after initial completion, to return and continue with items she could not complete beforehand. In min-mod complex naming tasks pt req'd mod-max cues usually (compound words).      Assessment / Recommendations / Plan   Plan  Continue with current plan of care      Progression Toward Goals   Progression toward goals  Progressing toward goals  SLP Short Term Goals - 02/28/17 1546      SLP SHORT TERM GOAL #1   Title  1    Time  1    Period  Weeks    Status  Deferred      SLP SHORT TERM GOAL #2   Title  pt will perform respiratory muscle training exercises with modified independence over two sessions    Time  1    Period  Weeks    Status  Deferred      SLP SHORT TERM GOAL #3   Title  pt will demo compensatory strategies for intelligibility 70% of the time in 10 minutes simple conversation    Time  1    Period  Weeks    Status  Deferred      SLP SHORT TERM GOAL #4   Title  pt will demo abdominal breathing 80% of the time in sentence responses x3 sessions    Time  1    Period  Weeks    Status  Deferred       SLP Long Term Goals -  03/10/17 0947      SLP LONG TERM GOAL #1   Title  pt will use compensatory strategies for intelligibilty with modified independence in 10 minutes mod complex conversation x2 sessions    Status  Deferred      SLP LONG TERM GOAL #2   Title  pt will demo respiratory muscle training exercises with independence x2 sessions    Status  Deferred      SLP LONG TERM GOAL #3   Title  pt will perform HEP for dysarthria with modified independence x3 sessions    Status  Deferred      SLP LONG TERM GOAL #4   Title  pt will demo abdominal breathing functionally in 10 mintues mod complex conversation     Status  Deferred      SLP LONG TERM GOAL #5   Title  pt will use compensations for anomia in 10 minutes simple to mod compelx conversation functionally    Time  2    Period  Weeks    Status  On-going      SLP LONG TERM GOAL #6   Title  pt will name 8 items in mod abstract/complex category with rare min A    Time  2    Period  Weeks    Status  On-going      SLP LONG TERM GOAL #7   Title  pt will complete simple to mod complex naming tasks with occasional min A over three sessions    Time  2    Period  Weeks    Status  On-going       Plan - 03/10/17 1058    Clinical Impression Statement  Pt continues with expressive aphasia c/b anomia and dysnomia in conversation, with rare pausing/hesitation. It appears as though her skills in specific language tasks are depressed when comparing to expressive language skills in conversation. Skilled ST continues to be warranted to maximize ability to fluidly converse with improved word finding.     Speech Therapy Frequency  2x / week    Duration  4 weeks    Treatment/Interventions  Oral motor exercises;SLP instruction and feedback;Environmental controls;Cueing hierarchy;Compensatory strategies;Internal/external aids;Patient/family education    Potential to Achieve Goals  Good    Consulted and Agree with Plan of Care  Patient       Patient will benefit  from skilled  therapeutic intervention in order to improve the following deficits and impairments:   Aphasia  Dysarthria and anarthria    Problem List Patient Active Problem List   Diagnosis Date Noted  . Hemiparesis affecting left side as late effect of cerebrovascular accident (CVA) ([place]) 11/11/2016  . Gait disturbance, post-stroke 11/11/2016  . Dysarthria as late effect of stroke 11/11/2016  . Carotid artery stenosis 11/02/2016  . Right middle cerebral artery stroke ([place]) 10/19/2016  . Left arm weakness   . Diastolic dysfunction   . Hypertension with heart disease   . Tobacco abuse   . Acute blood loss anemia   . Cerebral infarction due to embolism of right middle cerebral artery ([place])   . Stroke (cerebrum) ([place]) 10/15/2016  . Perirectal abscess 09/26/2015  . Vitamin D deficiency 07/23/2015  . Osteopenia 02/16/2013  . Ankle fracture 01/19/2013  . Anemia 09/27/2011  . Uncontrolled type 2 diabetes mellitus with insulin therapy ([place]) 09/24/2011  . Hyperlipidemia 09/24/2011  . Hypertension 09/24/2011    [facility] ,[place], [place]  03/10/2017, 11:01 AM  [facility] [address] [place], [place], [address] Phone: [phone]   Fax:  [phone]   Name: Sarah [name] MRN: [id number] Date of Birth: 1945-04-11

## 2017-03-11 LAB — CUP PACEART REMOTE DEVICE CHECK
Date Time Interrogation Session: 20190206204101
Implantable Pulse Generator Implant Date: 20181009

## 2017-03-15 ENCOUNTER — Encounter: Payer: Medicare Other | Attending: Physical Medicine & Rehabilitation

## 2017-03-15 ENCOUNTER — Encounter: Payer: Self-pay | Admitting: Physical Medicine & Rehabilitation

## 2017-03-15 ENCOUNTER — Ambulatory Visit: Payer: Medicare Other | Attending: Physical Medicine & Rehabilitation

## 2017-03-15 ENCOUNTER — Ambulatory Visit: Payer: Medicare Other | Admitting: Physical Medicine & Rehabilitation

## 2017-03-15 VITALS — BP 121/75 | HR 84 | Resp 14

## 2017-03-15 DIAGNOSIS — I69354 Hemiplegia and hemiparesis following cerebral infarction affecting left non-dominant side: Secondary | ICD-10-CM | POA: Insufficient documentation

## 2017-03-15 DIAGNOSIS — R4701 Aphasia: Secondary | ICD-10-CM

## 2017-03-15 DIAGNOSIS — I69322 Dysarthria following cerebral infarction: Secondary | ICD-10-CM | POA: Insufficient documentation

## 2017-03-15 DIAGNOSIS — Z720 Tobacco use: Secondary | ICD-10-CM | POA: Insufficient documentation

## 2017-03-15 DIAGNOSIS — R471 Dysarthria and anarthria: Secondary | ICD-10-CM | POA: Insufficient documentation

## 2017-03-15 DIAGNOSIS — I10 Essential (primary) hypertension: Secondary | ICD-10-CM | POA: Diagnosis not present

## 2017-03-15 DIAGNOSIS — R269 Unspecified abnormalities of gait and mobility: Secondary | ICD-10-CM | POA: Insufficient documentation

## 2017-03-15 DIAGNOSIS — I69398 Other sequelae of cerebral infarction: Secondary | ICD-10-CM | POA: Diagnosis not present

## 2017-03-15 DIAGNOSIS — R2681 Unsteadiness on feet: Secondary | ICD-10-CM | POA: Insufficient documentation

## 2017-03-15 DIAGNOSIS — E119 Type 2 diabetes mellitus without complications: Secondary | ICD-10-CM | POA: Insufficient documentation

## 2017-03-15 DIAGNOSIS — E785 Hyperlipidemia, unspecified: Secondary | ICD-10-CM | POA: Insufficient documentation

## 2017-03-15 DIAGNOSIS — M6281 Muscle weakness (generalized): Secondary | ICD-10-CM | POA: Diagnosis not present

## 2017-03-15 DIAGNOSIS — R2689 Other abnormalities of gait and mobility: Secondary | ICD-10-CM | POA: Insufficient documentation

## 2017-03-15 DIAGNOSIS — R488 Other symbolic dysfunctions: Secondary | ICD-10-CM | POA: Diagnosis not present

## 2017-03-15 NOTE — Progress Notes (Signed)
HPI:   [name] is a 72 y.o. female, who is here today for 4 months follow up.   She was last seen on 12/08/16  Since she last OV she has follows with Dr Letta Pate and Dr Erlinda Hong.    Diabetes Mellitus II:   Currently on Lantus 30 U bid. She has been increasing dose because BS's have been elevated. She has not been compliant with dietary recommendations.   Checking BS's : FG 150-200 Hypoglycemia:Denies. Eye exam 09/2016.  She is tolerating medications well. She denies abdominal pain, nausea, vomiting, or polyuria. Sometimes polydipsia and polyphagia. No numbness, tingling, or burning.   Lab Results  Component Value Date   CREATININE 0.50 01/14/2017   BUN 11 01/14/2017   NA 141 01/14/2017   K 4.0 01/14/2017   CL 105 01/14/2017   CO2 29 12/08/2016    Lab Results  Component Value Date   HGBA1C 12.9 (H) 10/16/2016   Lab Results  Component Value Date   MICROALBUR 82.3 (H) 08/09/2016   She was on Trulicity before but she could not afford it.  Hypertension: Currently she is on Amlodipine 5 mg daily.  Denies severe/frequent headache, visual changes, chest pain, dyspnea, palpitation, claudication,new focal weakness, or edema. Hx of CVA, residual dysarthria. She is stil doing speech therapy. She is following with Dr Windell Hummingbird.    She is on Plavix 75 mg daily.  She is driving now and not longer needs assistance for walking.   Vitamin D deficiency: Currently she is on Ergocalciferol 50,000 units twice per week.    Review of Systems  Constitutional: Positive for fatigue (no more than usual). Negative for activity change, appetite change and fever.  HENT: Negative for mouth sores, nosebleeds and trouble swallowing.   Eyes: Negative for redness and visual disturbance.  Respiratory: Negative for cough, shortness of breath and wheezing.   Cardiovascular: Negative for chest pain, palpitations and leg swelling.  Gastrointestinal: Negative for  abdominal pain, nausea and vomiting.       Negative for changes in bowel habits.  Endocrine: Positive for polydipsia and polyphagia. Negative for heat intolerance and polyuria.  Genitourinary: Negative for decreased urine volume, dysuria and hematuria.  Musculoskeletal: Negative for gait problem and myalgias.  Skin: Negative for rash and wound.  Neurological: Positive for speech difficulty (residual from CVA). Negative for seizures, syncope, weakness and headaches.  Psychiatric/Behavioral: Negative for confusion.      Current Outpatient Medications on File Prior to Visit  Medication Sig Dispense Refill  . amLODipine (NORVASC) 5 MG tablet Take 1 tablet (5 mg total) by mouth daily. 90 tablet 1  . atorvastatin (LIPITOR) 40 MG tablet Take 1 tablet (40 mg total) by mouth daily. 90 tablet 1  . clopidogrel (PLAVIX) 75 MG tablet TAKE 1 TABLET BY MOUTH DAILY 90 tablet 2  . ibuprofen (ADVIL,MOTRIN) 800 MG tablet TK 1 T PO Q 6 H PRN P  1  . Insulin Glargine (LANTUS) 100 UNIT/ML Solostar Pen 15 U in the morning and 20 U pm. 15 mL 3  . Insulin Pen Needle (BD PEN NEEDLE NANO U/F) 32G X 4 MM MISC USE UP TO FOUR TIMES DAILY 200 each 0  . TRAVATAN Z 0.004 % SOLN ophthalmic solution Place 1 drop into both eyes at bedtime.  11  . Vitamin D, Ergocalciferol, (DRISDOL) 50000 units CAPS capsule TAKE ONE CAPSULE BY MOUTH TWICE A WEEK ON MONDAY AND THURSDAY 8 capsule 2   No current facility-administered medications  on file prior to visit.      Past Medical History:  Diagnosis Date  . Boils   . Glaucoma   . Heart murmur   . HTN (hypertension)   . Hx of adenomatous colonic polyps   . Hyperlipidemia   . Osteoporosis   . Pneumonia   . Stroke ([place])   . Type II or unspecified type diabetes mellitus without mention of complication, uncontrolled    No Known Allergies  Social History   Socioeconomic History  . Marital status: Single    Spouse name: None  . Number of children: None  . Years of  education: None  . Highest education level: None  Social Needs  . Financial resource strain: None  . Food insecurity - worry: None  . Food insecurity - inability: None  . Transportation needs - medical: None  . Transportation needs - non-medical: None  Occupational History  . None  Tobacco Use  . Smoking status: Former Smoker    Types: Cigarettes    Last attempt to quit: 10/15/2016    Years since quitting: 0.4  . Smokeless tobacco: Never Used  . Tobacco comment: smokes occ.   Substance and Sexual Activity  . Alcohol use: Yes    Alcohol/week: 0.0 oz    Comment: occ  . Drug use: No  . Sexual activity: None  Other Topics Concern  . None  Social History Narrative  . None    Vitals:   03/16/17 1431  BP: 125/80  Pulse: 93  Resp: 12  Temp: 98.6 F (37 C)  SpO2: 97%   Body mass index is 27.44 kg/m.    Physical Exam  Nursing note and vitals reviewed. Constitutional: She is oriented to person, place, and time. She appears well-developed and well-nourished. No distress.  HENT:  Head: Normocephalic and atraumatic.  Mouth/Throat: Oropharynx is clear and moist and mucous membranes are normal.  Eyes: Conjunctivae are normal. Pupils are equal, round, and reactive to light.  Cardiovascular: Normal rate and regular rhythm.  No murmur heard. Pulses:      Dorsalis pedis pulses are 2+ on the right side, and 2+ on the left side.  Respiratory: Effort normal and breath sounds normal. No respiratory distress.  GI: Soft. She exhibits no mass. There is no hepatomegaly. There is no tenderness.  Musculoskeletal: She exhibits no edema or tenderness.  Lymphadenopathy:    She has no cervical adenopathy.  Neurological: She is alert and oriented to person, place, and time.  No focal deficit appreciated. Stable gait without assistance. Mild dysarthria.  Skin: Skin is warm. No erythema.  Psychiatric: She has a normal mood and affect.  Well groomed, good eye contact.   Diabetic Foot Exam  - Simple   Simple Foot Form Diabetic Foot exam was performed with the following findings:  Yes 03/16/2017  2:58 PM  Visual Inspection See comments:  Yes Sensation Testing Intact to touch and monofilament testing bilaterally:  Yes Pulse Check Posterior Tibialis and Dorsalis pulse intact bilaterally:  Yes Comments Calluses forefoot bilateral.       ASSESSMENT AND PLAN:   Ms. QUINLYN TEP was seen today for 4 months follow-up.  Orders Placed This Encounter  Procedures  . Hemoglobin A1c  . Microalbumin / creatinine urine ratio  . VITAMIN D 25 Hydroxy (Vit-D Deficiency, Fractures)   Lab Results  Component Value Date   HGBA1C 14.9 (H) 03/16/2017   Lab Results  Component Value Date   MICROALBUR 64.8 (H) 03/16/2017  Vitamin D deficiency No changes in current management, will follow labs done today and will give further recommendations accordingly.   Uncontrolled type 2 diabetes mellitus with insulin therapy ([place]) HgA1C has not been at goal and seems to be still uncontrolled . No changes in current management,will adjust management depending on HgA1C. Regular exercise and healthy diet with avoidance of added sugar food intake is an important part of treatment and recommended. Annual eye exam, periodic dental and foot care recommended. F/U in 3-4 months   Hypertension with heart disease Adequately controlled. No changes in current management. DASH diet recommended. Eye exam recommended annually. F/U in 6 months, before if needed.    Dysarthria as late effect of stroke  Slowly improving. She has ongoing speech therapy. Educated about the importance of secondary prevention. Continue Plavix and Lipitor.  Microalbuminuria  Educated about adverse effects of poorly controlled DM renal function. Adequate DM controlled as well as HTN. She is not on ECEI or ARB, we will discuss adding it next OV after rechecking renal function. Low salt diet and avoidance of  NSAID's.   -Ms. SHRAVYA WICKWIRE was advised to return sooner than planned today if new concerns arise.       Aury Scollard G. [name], MD  [facility]. [place] office.

## 2017-03-15 NOTE — Progress Notes (Signed)
Subjective:    Patient ID: Sarah [name], female    DOB: 1945-08-06, 72 y.o.   MRN: [id number] 72 year old right-handed female with history of hypertension, hyperlipidemia, diabetes mellitus, tobacco abuse, who presented on October 15, 2016, with left-sided weakness and facial droop. She lives with her elderly mother independent prior to admission. Cranial CT scan completed showing right frontal infarction. MRI, MRA showed no acute hemorrhage or mass effect. Multifocal ischemia in right MCA distribution. The patient did not receive tPA. Echocardiogram with ejection fraction of 40%, grade 2 diastolic dysfunction. Carotid Dopplers with 40-59% right ICA stenosis. CT angiogram of the neck showed no emboli. Neurology consulted, placed on aspirin and Plavix therapy. TEE showed no PFO without masses. A loop recorder was placed. HPI Able to carry laundry up and down stairs Finishing up with PT and SLP this month Working on naming and speech intelligibility with SLP Patient has some dental work planned.  She has followed up with neurology who recommends 6 months on Plavix before she can come off for an elective procedure. Patient also has a trip to [facility] planned with some friends in April. She remains independent with all self-care and mobility no falls. Pain Inventory Average Pain 0 Pain Right Now 0 My pain is no pain  In the last 24 hours, has pain interfered with the following? General activity 0 Relation with others 0 Enjoyment of life 0 What TIME of day is your pain at its worst? no pain Sleep (in general) Good  Pain is worse with: no pain Pain improves with: no pain Relief from Meds: no pain  Mobility walk without assistance ability to climb steps?  yes do you drive?  yes  Function retired  Neuro/Psych No problems in this area  Prior Studies Any changes since last visit?  no  Physicians involved in your care Any changes since last visit?   no   Family History  Problem Relation Age of Onset  . Diabetes Mother   . Hypertension Mother   . Stroke Father   . Stroke Maternal Uncle   . Stroke Paternal Uncle   . Colon cancer Neg Hx   . Esophageal cancer Neg Hx   . Rectal cancer Neg Hx   . Stomach cancer Neg Hx    Social History   Socioeconomic History  . Marital status: Single    Spouse name: None  . Number of children: None  . Years of education: None  . Highest education level: None  Social Needs  . Financial resource strain: None  . Food insecurity - worry: None  . Food insecurity - inability: None  . Transportation needs - medical: None  . Transportation needs - non-medical: None  Occupational History  . None  Tobacco Use  . Smoking status: Former Smoker    Types: Cigarettes    Last attempt to quit: 10/15/2016    Years since quitting: 0.4  . Smokeless tobacco: Never Used  . Tobacco comment: smokes occ.   Substance and Sexual Activity  . Alcohol use: Yes    Alcohol/week: 0.0 oz    Comment: occ  . Drug use: No  . Sexual activity: None  Other Topics Concern  . None  Social History Narrative  . None   Past Surgical History:  Procedure Laterality Date  . ABDOMINAL HYSTERECTOMY    . CARPAL TUNNEL RELEASE     right  . COLONOSCOPY  12-10-10   per Dr. Deatra Ina, clear, repeat in 7 yrs   .  INCISION AND DRAINAGE PERIRECTAL ABSCESS N/A 09/26/2015   Procedure: IRRIGATION AND DEBRIDEMENT PERIRECTAL ABSCESS;  Surgeon: Mickeal Skinner, MD;  Location: [facility];  Service: General;  Laterality: N/A;  . KNEE ARTHROSCOPY     right  . LOOP RECORDER INSERTION N/A 10/19/2016   Procedure: LOOP RECORDER INSERTION;  Surgeon: Thompson Grayer, MD;  Location: [facility];  Service: Cardiovascular;  Laterality: N/A;  . ORIF ANKLE FRACTURE Right 03/24/2012   Procedure: OPEN REDUCTION INTERNAL FIXATION (ORIF) ANKLE FRACTURE;  Surgeon: Newt Minion, MD;  Location: [facility];  Service: Orthopedics;  Laterality: Right;  Open  Reduction Internal Fixation Right Bimalleolar ankle fracture  . POLYPECTOMY    . TEE WITHOUT CARDIOVERSION N/A 10/18/2016   Procedure: TRANSESOPHAGEAL ECHOCARDIOGRAM (TEE);  Surgeon: Fay Records, MD;  Location: [facility];  Service: Cardiovascular;  Laterality: N/A;  . TONSILLECTOMY     Past Medical History:  Diagnosis Date  . Boils   . Glaucoma   . Heart murmur   . HTN (hypertension)   . Hx of adenomatous colonic polyps   . Hyperlipidemia   . Osteoporosis   . Pneumonia   . Stroke ([place])   . Type II or unspecified type diabetes mellitus without mention of complication, uncontrolled    BP 121/75 (BP Location: Left Arm, Patient Position: Sitting, Cuff Size: Normal)   Pulse 84   Resp 14   SpO2 95%   Opioid Risk Score:   Fall Risk Score:  `1  Depression screen PHQ 2/9  Depression screen [facility] 2/9 01/21/2017 11/11/2016 09/06/2014 03/27/2013 01/19/2013  Decreased Interest 0 0 0 0 0  Down, Depressed, Hopeless 0 1 0 0 0  PHQ - 2 Score 0 1 0 0 0  Altered sleeping - 0 - - -  Tired, decreased energy - 1 - - -  Change in appetite - 0 - - -  Feeling bad or failure about yourself  - 0 - - -  Trouble concentrating - 1 - - -  Moving slowly or fidgety/restless - 1 - - -  Suicidal thoughts - 0 - - -  PHQ-9 Score - 4 - - -  Difficult doing work/chores - Somewhat difficult - - -    Review of Systems  Constitutional: Negative.   HENT: Negative.   Eyes: Negative.   Respiratory: Negative.   Cardiovascular: Negative.   Gastrointestinal: Negative.   Endocrine: Negative.   Genitourinary: Negative.   Musculoskeletal: Negative.   Skin: Negative.   Allergic/Immunologic: Negative.   Neurological: Negative.   Hematological: Negative.   Psychiatric/Behavioral: Negative.   All other systems reviewed and are negative.      Objective:   Physical Exam   General no acute distress mood and affect are appropriate Motor strength is 5/5 bilateral deltoid bicep tricep grip hip flexor knee  extensor ankle dorsiflexor Sensation intact Speech with mild disfluency occasional word finding deficits Gait without evidence of toe drag or knee instability.  She is able to Toe Walk and Heel Walk for short distances.  She does not utilize an assistive device.     Assessment & Plan:   1.  Right MCA distribution infarct left hemiparesis has largely resolved.  Still has some word finding deficits as well as dysarthria.  Patient will finish PT in the next couple weeks, I recommended bringing her luggage so she could work on handling this for her upcoming trip.  She will continue speech for the next month.  We discussed that her speech  recovery will likely be more prolonged up to 1 year post stroke. We discussed that her 53-month mark will be in early April and that she should be able to come off her Plavix at that time for 1 week.  She should clarify with her dentist whether she can go on aspirin while she is off the Plavix. I will see her back in approximately 2 months

## 2017-03-15 NOTE — Therapy (Signed)
[facility] [address] [place], [place], [address] Phone: [phone]   Fax:  [phone]  Speech Language Pathology Treatment  Patient Details  Name: Sarah [name] MRN: [id number] Date of Birth: 07-07-1945 Referring Provider: Alysia Penna, MD   Encounter Date: 03/15/2017  End of Session - 03/15/17 1440    Visit Number  12    Number of Visits  17    Date for SLP Re-Evaluation  04/01/17    SLP Start Time  1103    SLP Stop Time   1145    SLP Time Calculation (min)  42 min    Activity Tolerance  Patient tolerated treatment well       Past Medical History:  Diagnosis Date  . Boils   . Glaucoma   . Heart murmur   . HTN (hypertension)   . Hx of adenomatous colonic polyps   . Hyperlipidemia   . Osteoporosis   . Pneumonia   . Stroke ([place])   . Type II or unspecified type diabetes mellitus without mention of complication, uncontrolled     Past Surgical History:  Procedure Laterality Date  . ABDOMINAL HYSTERECTOMY    . CARPAL TUNNEL RELEASE     right  . COLONOSCOPY  12-10-10   per Dr. Deatra Ina, clear, repeat in 7 yrs   . INCISION AND DRAINAGE PERIRECTAL ABSCESS N/A 09/26/2015   Procedure: IRRIGATION AND DEBRIDEMENT PERIRECTAL ABSCESS;  Surgeon: Mickeal Skinner, MD;  Location: [facility];  Service: General;  Laterality: N/A;  . KNEE ARTHROSCOPY     right  . LOOP RECORDER INSERTION N/A 10/19/2016   Procedure: LOOP RECORDER INSERTION;  Surgeon: Thompson Grayer, MD;  Location: [facility];  Service: Cardiovascular;  Laterality: N/A;  . ORIF ANKLE FRACTURE Right 03/24/2012   Procedure: OPEN REDUCTION INTERNAL FIXATION (ORIF) ANKLE FRACTURE;  Surgeon: Newt Minion, MD;  Location: [facility];  Service: Orthopedics;  Laterality: Right;  Open Reduction Internal Fixation Right Bimalleolar ankle fracture  . POLYPECTOMY    . TEE WITHOUT CARDIOVERSION N/A 10/18/2016   Procedure: TRANSESOPHAGEAL ECHOCARDIOGRAM (TEE);   Surgeon: Fay Records, MD;  Location: [facility] END[facility];  Service: Cardiovascular;  Laterality: N/A;  . TONSILLECTOMY      There were no vitals filed for this visit.  Subjective Assessment - 03/15/17 1113    Subjective  "I'm not spending as much time, or as often, trying to find the words I'm saying."    Currently in Pain?  No/denies            ADULT SLP TREATMENT - 03/15/17 1114      General Information   Behavior/Cognition  Alert;Cooperative;Pleasant mood      Treatment Provided   Treatment provided  Cognitive-Linquistic      Cognitive-Linquistic Treatment   Treatment focused on  Aphasia    Skilled Treatment  SLP worked with pt on specific word finding tasks (names of characters and historical figures -entertainment, politics). Pt req'd mod cues occasionally, and stated she would have known the answers to these things prior to the CVA.       Assessment / Recommendations / Plan   Plan  Continue with current plan of care      Progression Toward Goals   Progression toward goals  Progressing toward goals         SLP Short Term Goals - 02/28/17 1546      SLP SHORT TERM GOAL #1   Title  1  Time  1    Period  Weeks    Status  Deferred      SLP SHORT TERM GOAL #2   Title  pt will perform respiratory muscle training exercises with modified independence over two sessions    Time  1    Period  Weeks    Status  Deferred      SLP SHORT TERM GOAL #3   Title  pt will demo compensatory strategies for intelligibility 70% of the time in 10 minutes simple conversation    Time  1    Period  Weeks    Status  Deferred      SLP SHORT TERM GOAL #4   Title  pt will demo abdominal breathing 80% of the time in sentence responses x3 sessions    Time  1    Period  Weeks    Status  Deferred       SLP Long Term Goals - 03/15/17 1440      SLP [place] #1   Title  pt will use compensatory strategies for intelligibilty with modified independence in 10 minutes mod complex  conversation x2 sessions    Status  Deferred      SLP LONG TERM GOAL #2   Title  pt will demo respiratory muscle training exercises with independence x2 sessions    Status  Deferred      SLP LONG TERM GOAL #3   Title  pt will perform HEP for dysarthria with modified independence x3 sessions    Status  Deferred      SLP LONG TERM GOAL #4   Title  pt will demo abdominal breathing functionally in 10 mintues mod complex conversation     Status  Deferred      SLP LONG TERM GOAL #5   Title  pt will use compensations for anomia in 10 minutes simple to mod compelx conversation functionally    Time  1    Period  Weeks    Status  On-going      SLP LONG TERM GOAL #6   Title  pt will name 8 items in mod abstract/complex category with rare min A    Time  1    Period  Weeks    Status  On-going      SLP LONG TERM GOAL #7   Title  pt will complete simple to mod complex naming tasks with occasional min A over three sessions    Time  1    Period  Weeks    Status  On-going       Plan - 03/15/17 1440    Clinical Impression Statement  Pt continues with expressive aphasia c/b anomia and dysnomia in conversation, with rare pausing/hesitation. Skilled ST continues to be warranted to maximize ability to fluidly converse with improved word finding.     Speech Therapy Frequency  2x / week    Duration  4 weeks    Treatment/Interventions  Oral motor exercises;SLP instruction and feedback;Environmental controls;Cueing hierarchy;Compensatory strategies;Internal/external aids;Patient/family education    Potential to Achieve Goals  Good    Consulted and Agree with Plan of Care  Patient       Patient will benefit from skilled therapeutic intervention in order to improve the following deficits and impairments:   Aphasia  Dysarthria and anarthria    Problem List Patient Active Problem List   Diagnosis Date Noted  . Hemiparesis affecting left side as late effect of cerebrovascular accident (CVA)  (  [place]) 11/11/2016  . Gait disturbance, post-stroke 11/11/2016  . Dysarthria as late effect of stroke 11/11/2016  . Carotid artery stenosis 11/02/2016  . Right middle cerebral artery stroke ([place]) 10/19/2016  . Left arm weakness   . Diastolic dysfunction   . Hypertension with heart disease   . Tobacco abuse   . Acute blood loss anemia   . Cerebral infarction due to embolism of right middle cerebral artery ([place])   . Stroke (cerebrum) ([place]) 10/15/2016  . Perirectal abscess 09/26/2015  . Vitamin D deficiency 07/23/2015  . Osteopenia 02/16/2013  . Ankle fracture 01/19/2013  . Anemia 09/27/2011  . Uncontrolled type 2 diabetes mellitus with insulin therapy ([place]) 09/24/2011  . Hyperlipidemia 09/24/2011  . Hypertension 09/24/2011    [facility] ,[place], [place]  03/15/2017, 2:41 PM  [facility] [address] [place], [place], [address] Phone: [phone]   Fax:  [phone]   Name: Sarah [name] MRN: [id number] Date of Birth: 21-Mar-1945

## 2017-03-15 NOTE — Patient Instructions (Signed)
  Please complete the assigned speech therapy homework prior to your next session and return it to the speech therapist at your next visit.  

## 2017-03-16 ENCOUNTER — Encounter: Payer: Self-pay | Admitting: Family Medicine

## 2017-03-16 ENCOUNTER — Ambulatory Visit (INDEPENDENT_AMBULATORY_CARE_PROVIDER_SITE_OTHER): Payer: Medicare Other | Admitting: Family Medicine

## 2017-03-16 VITALS — BP 125/80 | HR 93 | Temp 98.6°F | Resp 12 | Ht 60.0 in | Wt 140.5 lb

## 2017-03-16 DIAGNOSIS — I69322 Dysarthria following cerebral infarction: Secondary | ICD-10-CM

## 2017-03-16 DIAGNOSIS — R809 Proteinuria, unspecified: Secondary | ICD-10-CM

## 2017-03-16 DIAGNOSIS — I119 Hypertensive heart disease without heart failure: Secondary | ICD-10-CM

## 2017-03-16 DIAGNOSIS — IMO0002 Reserved for concepts with insufficient information to code with codable children: Secondary | ICD-10-CM

## 2017-03-16 DIAGNOSIS — E559 Vitamin D deficiency, unspecified: Secondary | ICD-10-CM

## 2017-03-16 DIAGNOSIS — E1165 Type 2 diabetes mellitus with hyperglycemia: Secondary | ICD-10-CM

## 2017-03-16 DIAGNOSIS — Z794 Long term (current) use of insulin: Secondary | ICD-10-CM

## 2017-03-16 LAB — HEMOGLOBIN A1C: Hgb A1c MFr Bld: 14.9 % — ABNORMAL HIGH (ref 4.6–6.5)

## 2017-03-16 LAB — MICROALBUMIN / CREATININE URINE RATIO
Creatinine,U: 40.8 mg/dL
Microalb Creat Ratio: 159 mg/g — ABNORMAL HIGH (ref 0.0–30.0)
Microalb, Ur: 64.8 mg/dL — ABNORMAL HIGH (ref 0.0–1.9)

## 2017-03-16 LAB — VITAMIN D 25 HYDROXY (VIT D DEFICIENCY, FRACTURES): VITD: 29.4 ng/mL — ABNORMAL LOW (ref 30.00–100.00)

## 2017-03-16 NOTE — Assessment & Plan Note (Signed)
Adequately controlled. No changes in current management. DASH diet recommended. Eye exam recommended annually. F/U in 6 months, before if needed.  

## 2017-03-16 NOTE — Patient Instructions (Signed)
A few things to remember from today's visit:   Uncontrolled type 2 diabetes mellitus with insulin therapy ([place]) - Plan: Hemoglobin A1c, Microalbumin / creatinine urine ratio  Vitamin D deficiency - Plan: VITAMIN D 25 Hydroxy (Vit-D Deficiency, Fractures)  Hypertension with heart disease  NO changes today.   Please be sure medication list is accurate. If a new problem present, please set up appointment sooner than planned today.

## 2017-03-16 NOTE — Assessment & Plan Note (Signed)
No changes in current management, will follow labs done today and will give further recommendations accordingly.  

## 2017-03-16 NOTE — Assessment & Plan Note (Signed)
HgA1C has not been at goal and seems to be still uncontrolled . No changes in current management,will adjust management depending on HgA1C. Regular exercise and healthy diet with avoidance of added sugar food intake is an important part of treatment and recommended. Annual eye exam, periodic dental and foot care recommended. F/U in 3-4 months

## 2017-03-17 ENCOUNTER — Encounter: Payer: Self-pay | Admitting: Family Medicine

## 2017-03-17 ENCOUNTER — Other Ambulatory Visit: Payer: Self-pay | Admitting: Family Medicine

## 2017-03-17 ENCOUNTER — Ambulatory Visit: Payer: Medicare Other

## 2017-03-17 DIAGNOSIS — M6281 Muscle weakness (generalized): Secondary | ICD-10-CM | POA: Diagnosis not present

## 2017-03-17 DIAGNOSIS — R4701 Aphasia: Secondary | ICD-10-CM

## 2017-03-17 DIAGNOSIS — R471 Dysarthria and anarthria: Secondary | ICD-10-CM

## 2017-03-17 DIAGNOSIS — R2689 Other abnormalities of gait and mobility: Secondary | ICD-10-CM | POA: Diagnosis not present

## 2017-03-17 DIAGNOSIS — R2681 Unsteadiness on feet: Secondary | ICD-10-CM | POA: Diagnosis not present

## 2017-03-17 NOTE — Therapy (Signed)
[facility] [address] [place], [place], [address] Phone: [phone]   Fax:  [phone]  Speech Language Pathology Treatment  Patient Details  Name: Sarah [name] MRN: [id number] Date of Birth: Oct 27, 1945 Referring Provider: Alysia Penna, MD   Encounter Date: 03/17/2017  End of Session - 03/17/17 1010    Visit Number  13    Number of Visits  17    Date for SLP Re-Evaluation  04/01/17    SLP Start Time  0936    SLP Stop Time   1015    SLP Time Calculation (min)  39 min    Activity Tolerance  Patient tolerated treatment well       Past Medical History:  Diagnosis Date  . Boils   . Glaucoma   . Heart murmur   . HTN (hypertension)   . Hx of adenomatous colonic polyps   . Hyperlipidemia   . Osteoporosis   . Pneumonia   . Stroke ([place])   . Type II or unspecified type diabetes mellitus without mention of complication, uncontrolled     Past Surgical History:  Procedure Laterality Date  . ABDOMINAL HYSTERECTOMY    . CARPAL TUNNEL RELEASE     right  . COLONOSCOPY  12-10-10   per Dr. Deatra Ina, clear, repeat in 7 yrs   . INCISION AND DRAINAGE PERIRECTAL ABSCESS N/A 09/26/2015   Procedure: IRRIGATION AND DEBRIDEMENT PERIRECTAL ABSCESS;  Surgeon: Mickeal Skinner, MD;  Location: [facility];  Service: General;  Laterality: N/A;  . KNEE ARTHROSCOPY     right  . LOOP RECORDER INSERTION N/A 10/19/2016   Procedure: LOOP RECORDER INSERTION;  Surgeon: Thompson Grayer, MD;  Location: [facility];  Service: Cardiovascular;  Laterality: N/A;  . ORIF ANKLE FRACTURE Right 03/24/2012   Procedure: OPEN REDUCTION INTERNAL FIXATION (ORIF) ANKLE FRACTURE;  Surgeon: Newt Minion, MD;  Location: [facility];  Service: Orthopedics;  Laterality: Right;  Open Reduction Internal Fixation Right Bimalleolar ankle fracture  . POLYPECTOMY    . TEE WITHOUT CARDIOVERSION N/A 10/18/2016   Procedure: TRANSESOPHAGEAL ECHOCARDIOGRAM (TEE);   Surgeon: Fay Records, MD;  Location: [facility] ENDOSCOPY;  Service: Cardiovascular;  Laterality: N/A;  . TONSILLECTOMY      There were no vitals filed for this visit.  Subjective Assessment - 03/17/17 0943    Subjective  Pt got our homework for SLP, confrontation naming task was difficult for some letters.    Currently in Pain?  No/denies            ADULT SLP TREATMENT - 03/17/17 0959      General Information   Behavior/Cognition  Alert;Cooperative;Pleasant mood      Treatment Provided   Treatment provided  Cognitive-Linquistic      Cognitive-Linquistic Treatment   Treatment focused on  Aphasia    Skilled Treatment  Pt again worked with specific word finding tasks with SLP - requiring mod-max cues consistently. SLP and pt talked in sipmle to mod copmlex conversation for 12 minutes. SLP talked with pt about remaining therapy needs. Pt agreed that her conversation is functional/normal, while specific word finding tasks are not going as well;pt agrees she can do specific tasks at home - two more visits at once/week were suggested and pt agrees.      Assessment / Recommendations / Plan   Plan  Continue with current plan of care      Progression Toward Goals   Progression toward goals  Progressing toward goals  SLP Short Term Goals - 02/28/17 1546      SLP SHORT TERM GOAL #1   Title  1    Time  1    Period  Weeks    Status  Deferred      SLP SHORT TERM GOAL #2   Title  pt will perform respiratory muscle training exercises with modified independence over two sessions    Time  1    Period  Weeks    Status  Deferred      SLP SHORT TERM GOAL #3   Title  pt will demo compensatory strategies for intelligibility 70% of the time in 10 minutes simple conversation    Time  1    Period  Weeks    Status  Deferred      SLP SHORT TERM GOAL #4   Title  pt will demo abdominal breathing 80% of the time in sentence responses x3 sessions    Time  1    Period  Weeks    Status   Deferred       SLP Long Term Goals - 03/17/17 1012      SLP LONG TERM GOAL #1   Title  pt will use compensatory strategies for intelligibilty with modified independence in 10 minutes mod complex conversation x2 sessions    Status  Deferred      SLP LONG TERM GOAL #2   Title  pt will demo respiratory muscle training exercises with independence x2 sessions    Status  Deferred      SLP LONG TERM GOAL #3   Title  pt will perform HEP for dysarthria with modified independence x3 sessions    Status  Deferred      SLP LONG TERM GOAL #4   Title  pt will demo abdominal breathing functionally in 10 mintues mod complex conversation     Status  Deferred      SLP LONG TERM GOAL #5   Title  pt will use compensations for anomia in 10 minutes simple to mod compelx conversation functionally    Status  Achieved      SLP LONG TERM GOAL #6   Title  pt will name 8 items in mod abstract/complex category with rare min A    Time  2    Period  Weeks    Status  Not Met and cont for two more visits      SLP LONG TERM GOAL #7   Title  pt will complete simple to mod complex naming tasks with occasional min A over three sessions    Time  2    Period  Weeks    Status  Not Met and continue two more visits       Plan - 03/17/17 1010    Clinical Impression Statement  Pt continues with expressive aphasia c/b anomia in specific word tasks. Skilled ST continues to be warranted to maximize ability to fluidly converse with improved word finding. Pt and SLP discussed pt's needs for further therapy and she agreed with SLP that her conversation the last few sessions has been very good and so she would like two more sessions for ensuring consistency over time.    Speech Therapy Frequency  1x /week    Duration  2 weeks    Treatment/Interventions  Oral motor exercises;SLP instruction and feedback;Environmental controls;Cueing hierarchy;Compensatory strategies;Internal/external aids;Patient/family education     Potential to Achieve Goals  Good    Consulted and Agree with Plan of  Care  Patient       Patient will benefit from skilled therapeutic intervention in order to improve the following deficits and impairments:   Aphasia  Dysarthria and anarthria    Problem List Patient Active Problem List   Diagnosis Date Noted  . Hemiparesis affecting left side as late effect of cerebrovascular accident (CVA) ([place]) 11/11/2016  . Gait disturbance, post-stroke 11/11/2016  . Dysarthria as late effect of stroke 11/11/2016  . Carotid artery stenosis 11/02/2016  . Right middle cerebral artery stroke ([place]) 10/19/2016  . Left arm weakness   . Diastolic dysfunction   . Hypertension with heart disease   . Tobacco abuse   . Acute blood loss anemia   . Cerebral infarction due to embolism of right middle cerebral artery ([place])   . Stroke (cerebrum) ([place]) 10/15/2016  . Perirectal abscess 09/26/2015  . Vitamin D deficiency 07/23/2015  . Osteopenia 02/16/2013  . Ankle fracture 01/19/2013  . Anemia 09/27/2011  . Uncontrolled type 2 diabetes mellitus with insulin therapy ([place]) 09/24/2011  . Hyperlipidemia 09/24/2011  . Hypertension 09/24/2011    [facility] ,[place], [place]  03/17/2017, 10:36 AM  [facility] [address] [place], [place], [address] Phone: [phone]   Fax:  [phone]   Name: Sarah [name] MRN: [id number] Date of Birth: November 03, 1945

## 2017-03-17 NOTE — Patient Instructions (Signed)
  Please complete the assigned speech therapy homework prior to your next session and return it to the speech therapist at your next visit.  

## 2017-03-18 ENCOUNTER — Ambulatory Visit: Payer: Medicare Other | Admitting: Physical Therapy

## 2017-03-18 ENCOUNTER — Ambulatory Visit: Payer: Medicare Other | Admitting: Physical Medicine & Rehabilitation

## 2017-03-21 ENCOUNTER — Ambulatory Visit: Payer: Medicare Other | Admitting: Physical Therapy

## 2017-03-21 ENCOUNTER — Ambulatory Visit (INDEPENDENT_AMBULATORY_CARE_PROVIDER_SITE_OTHER): Payer: Medicare Other | Admitting: *Deleted

## 2017-03-21 DIAGNOSIS — I639 Cerebral infarction, unspecified: Secondary | ICD-10-CM | POA: Diagnosis not present

## 2017-03-22 NOTE — Progress Notes (Signed)
[facility] Summary Report / Loop Recorder 

## 2017-03-23 ENCOUNTER — Ambulatory Visit: Payer: Medicare Other | Admitting: Physical Therapy

## 2017-03-23 ENCOUNTER — Encounter: Payer: Self-pay | Admitting: Physical Therapy

## 2017-03-23 ENCOUNTER — Ambulatory Visit: Payer: Medicare Other | Admitting: Family Medicine

## 2017-03-23 DIAGNOSIS — R2681 Unsteadiness on feet: Secondary | ICD-10-CM | POA: Diagnosis not present

## 2017-03-23 DIAGNOSIS — R2689 Other abnormalities of gait and mobility: Secondary | ICD-10-CM | POA: Diagnosis not present

## 2017-03-23 DIAGNOSIS — R4701 Aphasia: Secondary | ICD-10-CM | POA: Diagnosis not present

## 2017-03-23 DIAGNOSIS — M6281 Muscle weakness (generalized): Secondary | ICD-10-CM | POA: Diagnosis not present

## 2017-03-23 DIAGNOSIS — R471 Dysarthria and anarthria: Secondary | ICD-10-CM | POA: Diagnosis not present

## 2017-03-23 NOTE — Therapy (Signed)
[facility] [address] [place], [place], [address] Phone: [phone]   Fax:  [phone]  Physical Therapy Treatment  Patient Details  Name: Sarah [name] MRN: [id number] Date of Birth: 05-20-1945 Referring Provider: Alysia Penna MD   Encounter Date: 03/23/2017  PT End of Session - 03/23/17 1309    Visit Number  20    Number of Visits  31 recert 8/61/68    Date for PT Re-Evaluation  04/09/17    Authorization Type  [employer] Medicare    Authorization Time Period   02/08/17 to 04/09/17    PT Start Time  0845    PT Stop Time  0927    PT Time Calculation (min)  42 min    Equipment Utilized During Treatment  Gait belt    Activity Tolerance  Patient tolerated treatment well    Behavior During Therapy  [facility] for tasks assessed/performed       Past Medical History:  Diagnosis Date  . Boils   . Glaucoma   . Heart murmur   . HTN (hypertension)   . Hx of adenomatous colonic polyps   . Hyperlipidemia   . Osteoporosis   . Pneumonia   . Stroke ([place])   . Type II or unspecified type diabetes mellitus without mention of complication, uncontrolled     Past Surgical History:  Procedure Laterality Date  . ABDOMINAL HYSTERECTOMY    . CARPAL TUNNEL RELEASE     right  . COLONOSCOPY  12-10-10   per Dr. Deatra Ina, clear, repeat in 7 yrs   . INCISION AND DRAINAGE PERIRECTAL ABSCESS N/A 09/26/2015   Procedure: IRRIGATION AND DEBRIDEMENT PERIRECTAL ABSCESS;  Surgeon: Mickeal Skinner, MD;  Location: [facility];  Service: General;  Laterality: N/A;  . KNEE ARTHROSCOPY     right  . LOOP RECORDER INSERTION N/A 10/19/2016   Procedure: LOOP RECORDER INSERTION;  Surgeon: Thompson Grayer, MD;  Location: [facility];  Service: Cardiovascular;  Laterality: N/A;  . ORIF ANKLE FRACTURE Right 03/24/2012   Procedure: OPEN REDUCTION INTERNAL FIXATION (ORIF) ANKLE FRACTURE;  Surgeon: Newt Minion, MD;  Location: [facility];  Service:  Orthopedics;  Laterality: Right;  Open Reduction Internal Fixation Right Bimalleolar ankle fracture  . POLYPECTOMY    . TEE WITHOUT CARDIOVERSION N/A 10/18/2016   Procedure: TRANSESOPHAGEAL ECHOCARDIOGRAM (TEE);  Surgeon: Fay Records, MD;  Location: [facility] ENDOSCOPY;  Service: Cardiovascular;  Laterality: N/A;  . TONSILLECTOMY      There were no vitals filed for this visit.  Subjective Assessment - 03/23/17 0845    Subjective  Has looked into Silver Sneakers and [employer] apparently does not cover it anymore. She plans to call [facility] and see if they cover some other program. Wants to practice with her luggage for her trip to [facility] (leaves 4/27). Has felt more off-balance lately. Reports she has recently caught her left foot on the steps (both ascending and descending) several times.     Pertinent History  HTN, HLD, DM, neurop, tobacco, COPD, OP (lives w/ mom; sister came to stay)    Limitations  Standing;Walking    Patient Stated Goals  to improve the use of her left hand and walk without a walker    Currently in Pain?  No/denies                      [facility] Adult PT Treatment/Exercise - 03/23/17 1321      Ambulation/Gait  Ambulation/Gait Assistance  6: Modified independent (Device/Increase time);5: Supervision    Ambulation/Gait Assistance Details  supervision initially with complex mutlitasking while walking (manual and cognitve task) however progressed to modified independent as no imbalance but did slow her velocity signifticantly     Ambulation Distance (Feet)  400 Feet 600    Assistive device  None    Gait Pattern  Step-through pattern;Decreased stride length;Poor foot clearance - left;Decreased dorsiflexion - left    Ambulation Surface  Indoor    Stairs Assistance  4: Min guard;5: Supervision    Stairs Assistance Details (indicate cue type and reason)  minguard due to reports of recent catching foot; educated in controlling descent with eccentric PF (toe to flat foot) for  improved control; progressed to supervision; no episodes of catching foot     Stair Management Technique  No rails;One rail Right;Alternating pattern;Forwards    Number of Stairs  20    Height of Stairs  6          Balance Exercises - 03/23/17 1325      Balance Exercises: Standing   Standing Eyes Opened  Narrow base of support (BOS);Wide (BOA);Head turns;Foam/compliant surface    Standing Eyes Closed  Narrow base of support (BOS);Wide (BOA);Foam/compliant surface        PT Education - 03/23/17 1251    Education provided  Yes    Education Details  see addition to HEP    Person(s) Educated  Patient    Methods  Explanation;Demonstration;Handout    Comprehension  Verbalized understanding;Returned demonstration       PT Short Term Goals - 12/31/16 1750      PT SHORT TERM GOAL #1   Title  Assess gait velocity and set STG and LTG as appropriate. (Target all STGs 12/08/16)    Baseline  11/16/16: met today with PT to set goals    Status  Achieved      PT SHORT TERM GOAL #2   Title  Patient will be independent with HEP for balance, strength, and gait training.     Time  4    Period  Weeks    Status  Achieved      PT SHORT TERM GOAL #3   Title  Patient will ambulate 250 ft on level/unlevel exterior surfaces with LRAD modified independent.     Baseline  12/07/16  250 ft x 2 level indoor surfaces due to weather    Time  4    Period  Weeks    Status  Unable to assess      PT SHORT TERM GOAL #4   Title  Patient will improve TUG to <15 seconds with LRAD.     Baseline  12/07/16 11.06 sec no device    Time  8    Period  Weeks    Status  Achieved      PT SHORT TERM GOAL #5   Title  Patient will complete FGA with goal set as appropriate. (Target date 01/20/17)    Time  3    Period  Weeks    Status  New    Target Date  01/20/17        PT Long Term Goals - 03/23/17 1305      PT LONG TERM GOAL #1   Title  see prior notes for eariler LTGs      PT LONG TERM GOAL #6    Title  --    Baseline  --    Time  --  Period  --    Status  --      PT LONG TERM GOAL #7   Title  Pateint will ambulate 500 ft over unlevel outdoor surfaces (grass, gravel, curbs, inclines) with no assistive device modified independent. (distance reduced due to winter season) Target for remaining LTGs 03/10/17)    Baseline  02/08/17 deferred due to raining; 03/23/17 deferred due to lack of time    Status  Deferred      PT LONG TERM GOAL #8   Title  Patient will carry full laundry basket up/down 12 steps modified independent.     Baseline  02/08/17 minguard assist due to imbalance; 03/23/17 deferred as pt now places laundry basket on step above her as she ascends and descends    Status  Deferred      PT LONG TERM GOAL  #9   TITLE  pateint will be independent with updated/final HEP and have a plan for continued community-based fitness program upon discharge from PT.     Baseline  02/08/17 independent with HEP; has not yet looked into silver sneakers program through her insurance--reports will do today. [place] Has found out her insurance does not cover silver sneakers and is considering her options    Status  On-going            Plan - 03/23/17 1312    Clinical Impression Statement  Patient has not been seen since 02/28/17 (at frequency 1x/week) due to illness and then death in her family. She returns today still within her certification period to make up the final appointment she missed. She reported recent increase in problem ascending and descending steps (catching her foot) with portion of session assessing, problem-solving and updating her HEP to include step-ups. Discussed her upcoming trip to [facility] with family and friends and pt somewhat anxious re: ability to manage her own luggage. Initiated checking LTGs however due to these 2 issues did not have time to complete. Scheduled one additional appt with plan for patient to bring her carry-on luggage to  provide education on managing during her planned trip. Will also complete assessing LTGs and complete discharge.     Rehab Potential  Good    PT Frequency  1x / week updated for recert 8/85/02    PT Duration  4 weeks updated for recert 7/74/12    PT Treatment/Interventions  ADLs/Self Care Home Management;DME Instruction;Gait training;Stair training;Functional mobility training;Orthotic Fit/Training;Patient/family education;Neuromuscular re-education;Balance training;Therapeutic exercise;Therapeutic activities;Passive range of motion;Cognitive remediation    PT Next Visit Plan  check LTGs; recert for this visit; practice with her luggage (?in storage room?)    Consulted and Agree with Plan of Care  Patient       Patient will benefit from skilled therapeutic intervention in order to improve the following deficits and impairments:  Decreased balance, Decreased knowledge of use of DME, Decreased mobility, Decreased strength, Difficulty walking, Impaired UE functional use, Impaired vision/preception, Obesity, Pain  Visit Diagnosis: Unsteadiness on feet  Other abnormalities of gait and mobility     Problem List Patient Active Problem List   Diagnosis Date Noted  . Hemiparesis affecting left side as late effect of cerebrovascular accident (CVA) ([place]) 11/11/2016  . Gait disturbance, post-stroke 11/11/2016  . Dysarthria as late effect of stroke 11/11/2016  . Carotid artery stenosis 11/02/2016  . Right middle cerebral artery stroke ([place]) 10/19/2016  . Left arm weakness   . Diastolic dysfunction   . Hypertension with heart  disease   . Tobacco abuse   . Acute blood loss anemia   . Cerebral infarction due to embolism of right middle cerebral artery ([place])   . Stroke (cerebrum) ([place]) 10/15/2016  . Perirectal abscess 09/26/2015  . Vitamin D deficiency 07/23/2015  . Osteopenia 02/16/2013  . Ankle fracture 01/19/2013  . Anemia 09/27/2011  . Uncontrolled type 2 diabetes mellitus with insulin  therapy ([place]) 09/24/2011  . Hyperlipidemia 09/24/2011  . Hypertension 09/24/2011    Sarah [name], PT 03/23/2017, 1:26 PM  [facility] [address] [place], [place], [address] Phone: [phone]   Fax:  [phone]  Name: Sarah [name] MRN: [id number] Date of Birth: [birth year]

## 2017-03-23 NOTE — Patient Instructions (Addendum)
Step-Up: Forward    Leading with right leg, bring both feet onto bottom step. Step back down to return to starting position.  Repeat __20__ times per LEG. Do __1-2__ sessions per day.   Copyright  VHI. All rights reserved.    Feet Together (Compliant Surface) Head Motion - Eyes Closed    Stand on compliant surface: with feet together. Close eyes and hold for 60 seconds. Attempt __3_ times per session. Do __1__ sessions per day.  Copyright  VHI. All rights reserved.

## 2017-03-28 ENCOUNTER — Ambulatory Visit: Payer: Medicare Other | Admitting: Physical Therapy

## 2017-03-28 ENCOUNTER — Encounter: Payer: Self-pay | Admitting: Physical Therapy

## 2017-03-28 DIAGNOSIS — R471 Dysarthria and anarthria: Secondary | ICD-10-CM | POA: Diagnosis not present

## 2017-03-28 DIAGNOSIS — R4701 Aphasia: Secondary | ICD-10-CM | POA: Diagnosis not present

## 2017-03-28 DIAGNOSIS — R2689 Other abnormalities of gait and mobility: Secondary | ICD-10-CM | POA: Diagnosis not present

## 2017-03-28 DIAGNOSIS — M6281 Muscle weakness (generalized): Secondary | ICD-10-CM

## 2017-03-28 DIAGNOSIS — R2681 Unsteadiness on feet: Secondary | ICD-10-CM

## 2017-03-28 NOTE — Therapy (Signed)
[facility] [address] [place], [place], [address] Phone: [phone]   Fax:  [phone]  Physical Therapy Treatment and Discharge Summary  Patient Details  Name: Sarah [name] MRN: [id number] Date of Birth: 01-22-1945 Referring Provider: Alysia Penna MD   Encounter Date: 03/28/2017  PT End of Session - 03/28/17 1208    Visit Number  21    Number of Visits  31 recert 6/96/29    Date for PT Re-Evaluation  04/09/17    Authorization Type  [employer] Medicare    Authorization Time Period   02/08/17 to 04/09/17    PT Start Time  0800    PT Stop Time  0842    PT Time Calculation (min)  42 min    Activity Tolerance  Patient tolerated treatment well    Behavior During Therapy  [facility] for tasks assessed/performed       Past Medical History:  Diagnosis Date  . Boils   . Glaucoma   . Heart murmur   . HTN (hypertension)   . Hx of adenomatous colonic polyps   . Hyperlipidemia   . Osteoporosis   . Pneumonia   . Stroke ([place])   . Type II or unspecified type diabetes mellitus without mention of complication, uncontrolled     Past Surgical History:  Procedure Laterality Date  . ABDOMINAL HYSTERECTOMY    . CARPAL TUNNEL RELEASE     right  . COLONOSCOPY  12-10-10   per Dr. Deatra Ina, clear, repeat in 7 yrs   . INCISION AND DRAINAGE PERIRECTAL ABSCESS N/A 09/26/2015   Procedure: IRRIGATION AND DEBRIDEMENT PERIRECTAL ABSCESS;  Surgeon: Mickeal Skinner, MD;  Location: [facility];  Service: General;  Laterality: N/A;  . KNEE ARTHROSCOPY     right  . LOOP RECORDER INSERTION N/A 10/19/2016   Procedure: LOOP RECORDER INSERTION;  Surgeon: Thompson Grayer, MD;  Location: [facility];  Service: Cardiovascular;  Laterality: N/A;  . ORIF ANKLE FRACTURE Right 03/24/2012   Procedure: OPEN REDUCTION INTERNAL FIXATION (ORIF) ANKLE FRACTURE;  Surgeon: Newt Minion, MD;  Location: [facility];  Service: Orthopedics;  Laterality: Right;  Open  Reduction Internal Fixation Right Bimalleolar ankle fracture  . POLYPECTOMY    . TEE WITHOUT CARDIOVERSION N/A 10/18/2016   Procedure: TRANSESOPHAGEAL ECHOCARDIOGRAM (TEE);  Surgeon: Fay Records, MD;  Location: [facility] ENDOSCOPY;  Service: Cardiovascular;  Laterality: N/A;  . TONSILLECTOMY      There were no vitals filed for this visit.  Subjective Assessment - 03/28/17 0759    Subjective  Didn't sleep well last night. Forgot to bring her suitcase. Not really worried about managing her luggage on her trip--Dr. Dianna Limbo had recommended we practice.     Pertinent History  HTN, HLD, DM, neurop, tobacco, COPD, OP (lives w/ mom; sister came to stay)    Limitations  Standing;Walking    Patient Stated Goals  to improve the use of her left hand and walk without a walker    Currently in Pain?  Yes    Pain Score  8  with palpation    Pain Location  Shoulder    Pain Orientation  Right    Pain Descriptors / Indicators  Tender    Pain Type  Acute pain    Pain Onset  Today woke up with pain    Pain Frequency  Intermittent    Aggravating Factors   palpation superior shoulder; no motions seem to provoke pain    Effect of  Pain on Daily Activities  none        Treament- Simulated unlevel outdoor terrain with bean bags under red mats and blue mat. Patient able to ambulate with only one imbalance requiring a side-step and independent recovery of her balance. Progressed to marching, backwards walking, sidestepping, tandem walk with external assistance x 1 when side-step to her left and did not clear her left foot and lost her balance.   Strengthening-step ups on 6 inch step with light UE support on one rail; 10 each leg leading and lowering down.   Neuro re-education- Balance training on compliant surfaces (including blue foam beam, black beam) with weight-shifting, EO, EC, head turns. Standing on ramp on blue mat both upfacing and downfacing with EO, EC, head turns.                            PT Short Term Goals - 12/31/16 1750      PT SHORT TERM GOAL #1   Title  Assess gait velocity and set STG and LTG as appropriate. (Target all STGs 12/08/16)    Baseline  11/16/16: met today with PT to set goals    Status  Achieved      PT SHORT TERM GOAL #2   Title  Patient will be independent with HEP for balance, strength, and gait training.     Time  4    Period  Weeks    Status  Achieved      PT SHORT TERM GOAL #3   Title  Patient will ambulate 250 ft on level/unlevel exterior surfaces with LRAD modified independent.     Baseline  12/07/16  250 ft x 2 level indoor surfaces due to weather    Time  4    Period  Weeks    Status  Unable to assess      PT SHORT TERM GOAL #4   Title  Patient will improve TUG to <15 seconds with LRAD.     Baseline  12/07/16 11.06 sec no device    Time  8    Period  Weeks    Status  Achieved      PT SHORT TERM GOAL #5   Title  Patient will complete FGA with goal set as appropriate. (Target date 01/20/17)    Time  3    Period  Weeks    Status  New    Target Date  01/20/17        PT Long Term Goals - 03/28/17 1209      PT LONG TERM GOAL #1   Title  see prior notes for eariler LTGs      PT LONG TERM GOAL #7   Title  Pateint will ambulate 500 ft over unlevel outdoor surfaces (grass, gravel, curbs, inclines) with no assistive device modified independent. (distance reduced due to winter season) Target for remaining LTGs 03/10/17)    Baseline  02/08/17 deferred due to raining; 03/23/17 deferred due to lack of time; 03/28/17 simulated, met    Status  Achieved      PT LONG TERM GOAL #8   Title  Patient will carry full laundry basket up/down 12 steps modified independent.     Baseline  02/08/17 minguard assist due to imbalance; 03/23/17 deferred as pt now places laundry basket on step above her as she ascends and descends    Status  Deferred      PT LONG TERM GOAL  #9  TITLE  pateint will be independent  with updated/final HEP and have a plan for continued community-based fitness program upon discharge from PT.     Baseline  02/08/17 independent with HEP; has not yet looked into silver sneakers program through her insurance--reports will do today. [place] Has found out her insurance does not cover silver sneakers and is considering her options 03/28/17 she received membership # from [facility] and plans to attend the exercise classes    Status  Achieved            Plan - 03/28/17 1213    Clinical Impression Statement  Plan was to assess her safety with her luggage as she plans to go to [facility] in April, however she forgot to bring her luggage today. She then reports she really is not that worried about it. Remaining LTG assessed with overall pt meeting 2 of 3 updated LTG and one goal was deferred as she decided on another method to carry her laundry up/down the stairs in her home. Overall, Ms. Waguespack benefiitted from [place] although her attendance was limited by weather and family committments.     Rehab Potential  Good    PT Frequency  1x / week updated for recert 4/43/15    PT Duration  4 weeks updated for recert 4/00/86    PT Treatment/Interventions  ADLs/Self Care Home Management;DME Instruction;Gait training;Stair training;Functional mobility training;Orthotic Fit/Training;Patient/family education;Neuromuscular re-education;Balance training;Therapeutic exercise;Therapeutic activities;Passive range of motion;Cognitive remediation    Consulted and Agree with Plan of Care  Patient       Patient will benefit from skilled therapeutic intervention in order to improve the following deficits and impairments:  Decreased balance, Decreased knowledge of use of DME, Decreased mobility, Decreased strength, Difficulty walking, Impaired UE functional use, Impaired vision/preception, Obesity, Pain  Visit Diagnosis: Unsteadiness on feet  Other  abnormalities of gait and mobility  Muscle weakness (generalized)     Problem List Patient Active Problem List   Diagnosis Date Noted  . Hemiparesis affecting left side as late effect of cerebrovascular accident (CVA) ([place]) 11/11/2016  . Gait disturbance, post-stroke 11/11/2016  . Dysarthria as late effect of stroke 11/11/2016  . Carotid artery stenosis 11/02/2016  . Right middle cerebral artery stroke ([place]) 10/19/2016  . Left arm weakness   . Diastolic dysfunction   . Hypertension with heart disease   . Tobacco abuse   . Acute blood loss anemia   . Cerebral infarction due to embolism of right middle cerebral artery ([place])   . Stroke (cerebrum) ([place]) 10/15/2016  . Perirectal abscess 09/26/2015  . Vitamin D deficiency 07/23/2015  . Osteopenia 02/16/2013  . Ankle fracture 01/19/2013  . Anemia 09/27/2011  . Uncontrolled type 2 diabetes mellitus with insulin therapy ([place]) 09/24/2011  . Hyperlipidemia 09/24/2011  . Hypertension 09/24/2011   PHYSICAL THERAPY DISCHARGE SUMMARY  Visits from Start of Care: 21  Current functional level related to goals / functional outcomes: See above re: long term goals. Ambulating with no device.    Remaining deficits: Residual left leg weakness, including dorsiflexors fatigue   Education / Equipment: HEP provided  Plan: Patient agrees to discharge.  Patient goals were partially met. Patient is being discharged due to being pleased with the current functional level.  ?????       Rexanne Mano, PT 03/28/2017, 12:19 PM  [facility] [address] [place], [place], [address] Phone: [phone]  Fax:  [phone]  Name: Sarah [name] MRN: [id number] Date of Birth: 11-Jul-1945

## 2017-03-30 ENCOUNTER — Other Ambulatory Visit: Payer: Self-pay | Admitting: Family Medicine

## 2017-03-30 MED ORDER — LIRAGLUTIDE 18 MG/3ML ~~LOC~~ SOPN: PEN_INJECTOR | SUBCUTANEOUS | 3 refills | Status: DC

## 2017-03-30 MED ORDER — METFORMIN HCL 500 MG PO TABS: 500.0000 mg | ORAL_TABLET | Freq: Two times a day (BID) | ORAL | 1 refills | Status: DC

## 2017-04-04 ENCOUNTER — Ambulatory Visit: Payer: Medicare Other | Admitting: Physical Therapy

## 2017-04-04 ENCOUNTER — Ambulatory Visit: Payer: Medicare Other

## 2017-04-04 DIAGNOSIS — R2689 Other abnormalities of gait and mobility: Secondary | ICD-10-CM | POA: Diagnosis not present

## 2017-04-04 DIAGNOSIS — R2681 Unsteadiness on feet: Secondary | ICD-10-CM | POA: Diagnosis not present

## 2017-04-04 DIAGNOSIS — R471 Dysarthria and anarthria: Secondary | ICD-10-CM | POA: Diagnosis not present

## 2017-04-04 DIAGNOSIS — M6281 Muscle weakness (generalized): Secondary | ICD-10-CM | POA: Diagnosis not present

## 2017-04-04 DIAGNOSIS — R4701 Aphasia: Secondary | ICD-10-CM

## 2017-04-05 NOTE — Therapy (Signed)
[facility] [address] [place], [place], [address] Phone: [phone]   Fax:  [phone]  Speech Language Pathology Treatment  Patient Details  Name: Sarah [name] MRN: [id number] Date of Birth: 1945-11-23 Referring Provider: Alysia Penna, MD   Encounter Date: 04/04/2017  End of Session - 04/05/17 1216    Visit Number  14    Number of Visits  17    Date for SLP Re-Evaluation  04/04/17    SLP Start Time  92    SLP Stop Time   1100    SLP Time Calculation (min)  42 min    Activity Tolerance  Patient tolerated treatment well       Past Medical History:  Diagnosis Date  . Boils   . Glaucoma   . Heart murmur   . HTN (hypertension)   . Hx of adenomatous colonic polyps   . Hyperlipidemia   . Osteoporosis   . Pneumonia   . Stroke ([facility])   . Type II or unspecified type diabetes mellitus without mention of complication, uncontrolled     Past Surgical History:  Procedure Laterality Date  . ABDOMINAL HYSTERECTOMY    . CARPAL TUNNEL RELEASE     right  . COLONOSCOPY  12-10-10   per Dr. Deatra Ina, clear, repeat in 7 yrs   . INCISION AND DRAINAGE PERIRECTAL ABSCESS N/A 09/26/2015   Procedure: IRRIGATION AND DEBRIDEMENT PERIRECTAL ABSCESS;  Surgeon: Mickeal Skinner, MD;  Location: [facility];  Service: General;  Laterality: N/A;  . KNEE ARTHROSCOPY     right  . LOOP RECORDER INSERTION N/A 10/19/2016   Procedure: LOOP RECORDER INSERTION;  Surgeon: Thompson Grayer, MD;  Location: [facility];  Service: Cardiovascular;  Laterality: N/A;  . ORIF ANKLE FRACTURE Right 03/24/2012   Procedure: OPEN REDUCTION INTERNAL FIXATION (ORIF) ANKLE FRACTURE;  Surgeon: Newt Minion, MD;  Location: [facility];  Service: Orthopedics;  Laterality: Right;  Open Reduction Internal Fixation Right Bimalleolar ankle fracture  . POLYPECTOMY    . TEE WITHOUT CARDIOVERSION N/A 10/18/2016   Procedure: TRANSESOPHAGEAL ECHOCARDIOGRAM (TEE);   Surgeon: Fay Records, MD;  Location: [facility];  Service: Cardiovascular;  Laterality: N/A;  . TONSILLECTOMY      There were no vitals filed for this visit.  Subjective Assessment - 04/04/17 1050    Subjective  "I've been doing the papers you gave."    Currently in Pain?  Yes    Pain Score  1     Pain Location  Shoulder    Pain Orientation  Right    Pain Descriptors / Indicators  Tender    Pain Type  Acute pain    Pain Onset  1 to 4 weeks ago    Pain Frequency  Intermittent    Aggravating Factors   when pt rubs shoulder    Pain Relieving Factors  rest            ADULT SLP TREATMENT - 04/05/17 0001      General Information   Behavior/Cognition  Alert;Cooperative;Pleasant mood      Treatment Provided   Treatment provided  Cognitive-Linquistic      Cognitive-Linquistic Treatment   Treatment focused on  Aphasia    Skilled Treatment  Pt reports her skills have not declined since last session; Difficulty finding words once every one to two weeks, in conversation. In conversation today of mod complex nature pt without notable anomia, and pt agreed this was the case. Pt  generated average 8 items x3 in mod complex category. In simple to mod complex naming tasks pt req'd min-mod A.       Assessment / Recommendations / Plan   Plan  -- d/c      Progression Toward Goals   Progression toward goals  Goals met, education completed, patient discharged from [facility] - 02/28/17 1546      SLP SHORT TERM GOAL #1   Title  1    Time  1    Period  Weeks    Status  Deferred      SLP SHORT TERM GOAL #2   Title  pt will perform respiratory muscle training exercises with modified independence over two sessions    Time  1    Period  Weeks    Status  Deferred      SLP SHORT TERM GOAL #3   Title  pt will demo compensatory strategies for intelligibility 70% of the time in 10 minutes simple conversation    Time  1    Period  Weeks    Status  Deferred       SLP SHORT TERM GOAL #4   Title  pt will demo abdominal breathing 80% of the time in sentence responses x3 sessions    Time  1    Period  Weeks    Status  Deferred       SLP Long Term Goals - 04/04/17 [place] #1   Title  pt will use compensatory strategies for intelligibilty with modified independence in 10 minutes mod complex conversation x2 sessions    Status  Deferred      SLP LONG TERM GOAL #2   Title  pt will demo respiratory muscle training exercises with independence x2 sessions    Status  Deferred      SLP LONG TERM GOAL #3   Title  pt will perform HEP for dysarthria with modified independence x3 sessions    Status  Deferred      SLP LONG TERM GOAL #4   Title  pt will demo abdominal breathing functionally in 10 mintues mod complex conversation     Status  Deferred      SLP LONG TERM GOAL #5   Title  pt will use compensations for anomia in 10 minutes simple to mod compelx conversation functionally    Status  Achieved      SLP LONG TERM GOAL #6   Title  pt will name 8 items in mod abstract/complex category with rare min A    Status  Partially Met one of two visits      SLP LONG TERM GOAL #7   Title  pt will complete simple to mod complex naming tasks with occasional min A over three sessions    Status  Not Met       Plan - 04/05/17 1217    Clinical Impression Statement  Pt continues with expressive aphasia c/b anomia in specific word tasks. Pt and SLP discussed pt's needs for further therapy and she is satisfied with ST so far, and would like d/c today, when SLP gave pt the option of one more session.     Speech Therapy Frequency  1x /week    Duration  2 weeks    Treatment/Interventions  Oral motor exercises;SLP instruction and feedback;Environmental controls;Cueing hierarchy;Compensatory strategies;Internal/external aids;Patient/family education    Potential  to Achieve Goals  Good    Consulted and Agree with Plan of Care  Patient        Patient will benefit from skilled therapeutic intervention in order to improve the following deficits and impairments:   Aphasia  Dysarthria and anarthria   SPEECH THERAPY DISCHARGE SUMMARY  Visits from Start of Care: 14  Current functional level related to goals / functional outcomes: Pt is satisfied with progress as it stands currently, with her aphasia.  She was pleased with her therapy for dysarthria and requested a shift in POC to focus more on aphasia approx 5 sessions ago. See today's goal update for further details.  Remaining deficits: Mild dysarthria, language is WFL/WNL in mod complex-complex conversation and pt is satisfied with her progress to date.   Education / Equipment: Compensations for dysarthria and for aphasia.  Plan: Patient agrees to discharge.  Patient goals were partially met. Patient is being discharged due to being pleased with the current functional level.  ?????      Problem List Patient Active Problem List   Diagnosis Date Noted  . Hemiparesis affecting left side as late effect of cerebrovascular accident (CVA) ([facility]) 11/11/2016  . Gait disturbance, post-stroke 11/11/2016  . Dysarthria as late effect of stroke 11/11/2016  . Carotid artery stenosis 11/02/2016  . Right middle cerebral artery stroke ([facility]) 10/19/2016  . Left arm weakness   . Diastolic dysfunction   . Hypertension with heart disease   . Tobacco abuse   . Acute blood loss anemia   . Cerebral infarction due to embolism of right middle cerebral artery ([facility])   . Stroke (cerebrum) ([facility]) 10/15/2016  . Perirectal abscess 09/26/2015  . Vitamin D deficiency 07/23/2015  . Osteopenia 02/16/2013  . Ankle fracture 01/19/2013  . Anemia 09/27/2011  . Uncontrolled type 2 diabetes mellitus with insulin therapy ([place]) 09/24/2011  . Hyperlipidemia 09/24/2011  . Hypertension 09/24/2011    [facility] 04/05/2017, 12:19 PM  [facility] [address] [place], [place], [address] Phone: [phone]   Fax:  [phone]   Name: CHLORIS MARCOUX MRN: [id number] Date of Birth: 20-Jan-1945

## 2017-04-11 ENCOUNTER — Encounter: Payer: Medicare Other | Admitting: Speech Pathology

## 2017-04-11 ENCOUNTER — Ambulatory Visit: Payer: Medicare Other | Admitting: Physical Therapy

## 2017-04-13 ENCOUNTER — Ambulatory Visit: Payer: Medicare Other

## 2017-04-25 ENCOUNTER — Ambulatory Visit (INDEPENDENT_AMBULATORY_CARE_PROVIDER_SITE_OTHER): Payer: Medicare Other | Admitting: *Deleted

## 2017-04-25 DIAGNOSIS — I639 Cerebral infarction, unspecified: Secondary | ICD-10-CM

## 2017-04-25 NOTE — Progress Notes (Signed)
[facility] Summary Report / Loop Recorder 

## 2017-04-28 LAB — CUP PACEART REMOTE DEVICE CHECK
Date Time Interrogation Session: 20190311213936
Implantable Pulse Generator Implant Date: 20181009

## 2017-05-17 ENCOUNTER — Ambulatory Visit: Payer: Medicare Other | Admitting: Physical Medicine & Rehabilitation

## 2017-05-18 ENCOUNTER — Other Ambulatory Visit: Payer: Self-pay | Admitting: Family Medicine

## 2017-05-18 DIAGNOSIS — I639 Cerebral infarction, unspecified: Secondary | ICD-10-CM

## 2017-05-24 ENCOUNTER — Ambulatory Visit: Payer: Medicare Other | Admitting: Physical Medicine & Rehabilitation

## 2017-05-26 ENCOUNTER — Ambulatory Visit (INDEPENDENT_AMBULATORY_CARE_PROVIDER_SITE_OTHER): Payer: Medicare Other | Admitting: *Deleted

## 2017-05-26 DIAGNOSIS — I639 Cerebral infarction, unspecified: Secondary | ICD-10-CM | POA: Diagnosis not present

## 2017-05-26 NOTE — Progress Notes (Signed)
[facility] Summary Report / Loop Recorder 

## 2017-05-30 LAB — CUP PACEART REMOTE DEVICE CHECK
Date Time Interrogation Session: 20190413213700
Implantable Pulse Generator Implant Date: 20181009

## 2017-06-02 ENCOUNTER — Encounter: Payer: Self-pay | Admitting: Physical Medicine & Rehabilitation

## 2017-06-02 ENCOUNTER — Encounter: Payer: Medicare Other | Attending: Physical Medicine & Rehabilitation

## 2017-06-02 ENCOUNTER — Ambulatory Visit: Payer: Medicare Other | Admitting: Physical Medicine & Rehabilitation

## 2017-06-02 VITALS — BP 146/83 | HR 76 | Ht 60.0 in | Wt 145.0 lb

## 2017-06-02 DIAGNOSIS — Z720 Tobacco use: Secondary | ICD-10-CM | POA: Diagnosis not present

## 2017-06-02 DIAGNOSIS — I10 Essential (primary) hypertension: Secondary | ICD-10-CM | POA: Diagnosis not present

## 2017-06-02 DIAGNOSIS — R488 Other symbolic dysfunctions: Secondary | ICD-10-CM | POA: Diagnosis not present

## 2017-06-02 DIAGNOSIS — I69354 Hemiplegia and hemiparesis following cerebral infarction affecting left non-dominant side: Secondary | ICD-10-CM | POA: Diagnosis not present

## 2017-06-02 DIAGNOSIS — E785 Hyperlipidemia, unspecified: Secondary | ICD-10-CM | POA: Diagnosis not present

## 2017-06-02 DIAGNOSIS — I69398 Other sequelae of cerebral infarction: Secondary | ICD-10-CM | POA: Diagnosis not present

## 2017-06-02 DIAGNOSIS — I69322 Dysarthria following cerebral infarction: Secondary | ICD-10-CM

## 2017-06-02 DIAGNOSIS — E119 Type 2 diabetes mellitus without complications: Secondary | ICD-10-CM | POA: Diagnosis not present

## 2017-06-02 DIAGNOSIS — R269 Unspecified abnormalities of gait and mobility: Secondary | ICD-10-CM | POA: Insufficient documentation

## 2017-06-02 DIAGNOSIS — I63411 Cerebral infarction due to embolism of right middle cerebral artery: Secondary | ICD-10-CM

## 2017-06-02 NOTE — Patient Instructions (Addendum)
Make sure you ask Neurologist about coming off Plavix  Cont walking on a regular basis, may try stationary bike   Practice naming objects

## 2017-06-02 NOTE — Progress Notes (Addendum)
Subjective:    Patient ID: Sarah [name], female    DOB: Apr 10, 1945, 72 y.o.   MRN: [id number] 72 year old right-handed female with history of hypertension, hyperlipidemia, diabetes mellitus, tobacco abuse, who presented on October 15, 2016, with left-sided weakness and facial droop. She lives with her elderly mother independent prior to admission. Cranial CT scan completed showing right frontal infarction. MRI, MRA showed no acute hemorrhage or mass effect. Multifocal ischemia in right MCA distribution. The patient did not receive tPA. Echocardiogram with ejection fraction of 65%, grade 2 diastolic dysfunction. Carotid Dopplers with 40-59% right ICA stenosis. CT angiogram of the neck showed no emboli. Neurology consulted, placed on aspirin and Plavix therapy. TEE showed no PFO without masses. A loop recorder was placed.  HPI Had trip to [facility] with cousin No pains anywhere Having followup with optho regarding cataracts Has appt with oral surgeon for extraction,  Will need ok from Neuro  [facility] but has not gone yet  Remains independent with self-care mobility ambulance without assistive device.  Has been driving States she still has some residual word finding issues Pain Inventory Average Pain 0 Pain Right Now 0 My pain is na  In the last 24 hours, has pain interfered with the following? General activity 0 Relation with others 0 Enjoyment of life 0 What TIME of day is your pain at its worst? na Sleep (in general) Good  Pain is worse with: na Pain improves with: na Relief from Meds: na  Mobility walk without assistance  Function retired  Neuro/Psych No problems in this area  Prior Studies Any changes since last visit?  no  Physicians involved in your care Any changes since last visit?  no   Family History  Problem Relation Age of Onset  . Diabetes Mother   . Hypertension Mother   . Stroke Father   . Stroke Maternal Uncle   .  Stroke Paternal Uncle   . Colon cancer Neg Hx   . Esophageal cancer Neg Hx   . Rectal cancer Neg Hx   . Stomach cancer Neg Hx    Social History   Socioeconomic History  . Marital status: Single    Spouse name: Not on file  . Number of children: Not on file  . Years of education: Not on file  . Highest education level: Not on file  Occupational History  . Not on file  Social Needs  . Financial resource strain: Not on file  . Food insecurity:    Worry: Not on file    Inability: Not on file  . Transportation needs:    Medical: Not on file    Non-medical: Not on file  Tobacco Use  . Smoking status: Former Smoker    Types: Cigarettes    Last attempt to quit: 10/15/2016    Years since quitting: 0.6  . Smokeless tobacco: Never Used  . Tobacco comment: smokes occ.   Substance and Sexual Activity  . Alcohol use: Yes    Alcohol/week: 0.0 oz    Comment: occ  . Drug use: No  . Sexual activity: Not on file  Lifestyle  . Physical activity:    Days per week: Not on file    Minutes per session: Not on file  . Stress: Not on file  Relationships  . Social connections:    Talks on phone: Not on file    Gets together: Not on file    Attends religious service: Not on file  Active member of club or organization: Not on file    Attends meetings of clubs or organizations: Not on file    Relationship status: Not on file  Other Topics Concern  . Not on file  Social History Narrative  . Not on file   Past Surgical History:  Procedure Laterality Date  . ABDOMINAL HYSTERECTOMY    . CARPAL TUNNEL RELEASE     right  . COLONOSCOPY  12-10-10   per Dr. Deatra Ina, clear, repeat in 7 yrs   . INCISION AND DRAINAGE PERIRECTAL ABSCESS N/A 09/26/2015   Procedure: IRRIGATION AND DEBRIDEMENT PERIRECTAL ABSCESS;  Surgeon: Mickeal Skinner, MD;  Location: [facility];  Service: General;  Laterality: N/A;  . KNEE ARTHROSCOPY     right  . LOOP RECORDER INSERTION N/A 10/19/2016   Procedure: LOOP  RECORDER INSERTION;  Surgeon: Thompson Grayer, MD;  Location: [facility];  Service: Cardiovascular;  Laterality: N/A;  . ORIF ANKLE FRACTURE Right 03/24/2012   Procedure: OPEN REDUCTION INTERNAL FIXATION (ORIF) ANKLE FRACTURE;  Surgeon: Newt Minion, MD;  Location: [facility];  Service: Orthopedics;  Laterality: Right;  Open Reduction Internal Fixation Right Bimalleolar ankle fracture  . POLYPECTOMY    . TEE WITHOUT CARDIOVERSION N/A 10/18/2016   Procedure: TRANSESOPHAGEAL ECHOCARDIOGRAM (TEE);  Surgeon: Fay Records, MD;  Location: [facility];  Service: Cardiovascular;  Laterality: N/A;  . TONSILLECTOMY     Past Medical History:  Diagnosis Date  . Boils   . Glaucoma   . Heart murmur   . HTN (hypertension)   . Hx of adenomatous colonic polyps   . Hyperlipidemia   . Osteoporosis   . Pneumonia   . Stroke ([facility])   . Type II or unspecified type diabetes mellitus without mention of complication, uncontrolled    There were no vitals taken for this visit.  Opioid Risk Score:   Fall Risk Score:  `1  Depression screen PHQ 2/9  Depression screen [facility] 2/9 01/21/2017 11/11/2016 09/06/2014 03/27/2013 01/19/2013  Decreased Interest 0 0 0 0 0  Down, Depressed, Hopeless 0 1 0 0 0  PHQ - 2 Score 0 1 0 0 0  Altered sleeping - 0 - - -  Tired, decreased energy - 1 - - -  Change in appetite - 0 - - -  Feeling bad or failure about yourself  - 0 - - -  Trouble concentrating - 1 - - -  Moving slowly or fidgety/restless - 1 - - -  Suicidal thoughts - 0 - - -  PHQ-9 Score - 4 - - -  Difficult doing work/chores - Somewhat difficult - - -     Review of Systems  Constitutional: Negative.   HENT: Negative.   Eyes: Negative.   Respiratory: Negative.   Cardiovascular: Negative.   Gastrointestinal: Negative.   Endocrine: Negative.   Genitourinary: Negative.   Musculoskeletal: Negative.   Skin: Negative.   Allergic/Immunologic: Negative.   Neurological: Negative.   Hematological: Negative.     Psychiatric/Behavioral: Negative.   All other systems reviewed and are negative.      Objective:   Physical Exam  Constitutional: She is oriented to person, place, and time. She appears well-developed and well-nourished. No distress.  HENT:  Head: Normocephalic and atraumatic.  Eyes: Pupils are equal, round, and reactive to light. EOM are normal.  Neck: Normal range of motion.  Neurological: She is alert and oriented to person, place, and time. She displays no atrophy and no tremor. No  sensory deficit. Gait abnormal. Coordination normal.  Gait velocity is mildly reduced no evidence of toe drag or knee instability. There is minimal slowing of finger to thumb opposition on the left hand compared to the right hand.  Negative dysdiadochokinesis.  Motor strength is 5/5 bilateral deltoid bicep tricep grip hip flexor knee extensor ankle dorsiflexor.  Skin: She is not diaphoretic.  Psychiatric: She has a normal mood and affect. Judgment and thought content normal.  Nursing note and vitals reviewed.    Speech is fluent no evidence of dysarthria.     Assessment & Plan:  1.  Right frontal infarction onset in October 2018.  Has had an excellent functional recovery minimal fine motor deficits minimal gait disorder. No need for inpatient or outpatient rehab at the current time.  Will see the patient back on a as needed basis. Occasional anomia primarily for less frequently used vocabulary Follow-up with neurology for secondary stroke prevention Follow-up PCP for general medical care

## 2017-06-06 ENCOUNTER — Other Ambulatory Visit: Payer: Self-pay | Admitting: Family Medicine

## 2017-06-17 LAB — CUP PACEART REMOTE DEVICE CHECK
Date Time Interrogation Session: 20190516220942
Implantable Pulse Generator Implant Date: 20181009

## 2017-06-20 ENCOUNTER — Telehealth: Payer: Self-pay | Admitting: *Deleted

## 2017-06-20 DIAGNOSIS — H401111 Primary open-angle glaucoma, right eye, mild stage: Secondary | ICD-10-CM | POA: Diagnosis not present

## 2017-06-20 DIAGNOSIS — E119 Type 2 diabetes mellitus without complications: Secondary | ICD-10-CM | POA: Diagnosis not present

## 2017-06-20 DIAGNOSIS — Z01818 Encounter for other preprocedural examination: Secondary | ICD-10-CM | POA: Diagnosis not present

## 2017-06-20 DIAGNOSIS — H25812 Combined forms of age-related cataract, left eye: Secondary | ICD-10-CM | POA: Diagnosis not present

## 2017-06-20 NOTE — Telephone Encounter (Signed)
Copied from [place] [id number]. Topic: General - Other >> Jun 20, 2017 10:33 AM Oneta Rack wrote: [facility]  [address], [place], [place] [address], [phone], faxing over surgical clearance form for cataract extraction  surgery to [phone] please note when received.   >> Jun 20, 2017 10:40 AM Oneta Rack wrote: [facility]  [address], [place],  [address], [phone], faxing over surgical clearance form for cataract extraction surgery. Form fax to [phone] please note when received.

## 2017-06-22 NOTE — Telephone Encounter (Signed)
Form placed on doctor's desk for completion. 

## 2017-06-22 NOTE — Telephone Encounter (Signed)
[place] and left a message for Sarah [name] to refax form because I have not seen a form for patient and it has been 2 days.

## 2017-06-24 ENCOUNTER — Telehealth: Payer: Self-pay | Admitting: *Deleted

## 2017-06-27 NOTE — Telephone Encounter (Signed)
Opened in error

## 2017-06-28 NOTE — Telephone Encounter (Signed)
Forms faxed to [facility] on 06/28/17.

## 2017-06-29 ENCOUNTER — Ambulatory Visit (INDEPENDENT_AMBULATORY_CARE_PROVIDER_SITE_OTHER): Payer: Medicare Other | Admitting: *Deleted

## 2017-06-29 DIAGNOSIS — I639 Cerebral infarction, unspecified: Secondary | ICD-10-CM | POA: Diagnosis not present

## 2017-06-29 NOTE — Progress Notes (Signed)
[facility] Summary Report / Loop Recorder 

## 2017-07-05 NOTE — Progress Notes (Signed)
Clearance form fax to [phone] [facility]. Fax confirmed and receive.

## 2017-07-15 ENCOUNTER — Encounter: Payer: Self-pay | Admitting: Family Medicine

## 2017-07-15 DIAGNOSIS — Z1231 Encounter for screening mammogram for malignant neoplasm of breast: Secondary | ICD-10-CM | POA: Diagnosis not present

## 2017-07-15 LAB — HM MAMMOGRAPHY

## 2017-07-18 ENCOUNTER — Ambulatory Visit: Payer: Medicare Other | Admitting: Family Medicine

## 2017-07-21 ENCOUNTER — Encounter: Payer: Self-pay | Admitting: Family Medicine

## 2017-07-21 DIAGNOSIS — R921 Mammographic calcification found on diagnostic imaging of breast: Secondary | ICD-10-CM | POA: Diagnosis not present

## 2017-07-21 LAB — HM MAMMOGRAPHY

## 2017-07-29 ENCOUNTER — Telehealth: Payer: Self-pay | Admitting: *Deleted

## 2017-07-29 ENCOUNTER — Encounter: Payer: Self-pay | Admitting: Family Medicine

## 2017-07-29 ENCOUNTER — Ambulatory Visit (INDEPENDENT_AMBULATORY_CARE_PROVIDER_SITE_OTHER): Payer: Medicare Other | Admitting: Family Medicine

## 2017-07-29 VITALS — BP 130/70 | HR 87 | Temp 98.5°F | Resp 16 | Ht 60.0 in | Wt 141.5 lb

## 2017-07-29 DIAGNOSIS — I69398 Other sequelae of cerebral infarction: Secondary | ICD-10-CM

## 2017-07-29 DIAGNOSIS — Z9189 Other specified personal risk factors, not elsewhere classified: Secondary | ICD-10-CM | POA: Diagnosis not present

## 2017-07-29 DIAGNOSIS — R269 Unspecified abnormalities of gait and mobility: Secondary | ICD-10-CM | POA: Diagnosis not present

## 2017-07-29 DIAGNOSIS — M545 Low back pain, unspecified: Secondary | ICD-10-CM | POA: Insufficient documentation

## 2017-07-29 DIAGNOSIS — Z794 Long term (current) use of insulin: Secondary | ICD-10-CM

## 2017-07-29 DIAGNOSIS — E1121 Type 2 diabetes mellitus with diabetic nephropathy: Secondary | ICD-10-CM

## 2017-07-29 DIAGNOSIS — I119 Hypertensive heart disease without heart failure: Secondary | ICD-10-CM

## 2017-07-29 DIAGNOSIS — E559 Vitamin D deficiency, unspecified: Secondary | ICD-10-CM

## 2017-07-29 DIAGNOSIS — Z1159 Encounter for screening for other viral diseases: Secondary | ICD-10-CM

## 2017-07-29 DIAGNOSIS — IMO0002 Reserved for concepts with insufficient information to code with codable children: Secondary | ICD-10-CM

## 2017-07-29 DIAGNOSIS — E1165 Type 2 diabetes mellitus with hyperglycemia: Secondary | ICD-10-CM

## 2017-07-29 LAB — MICROALBUMIN / CREATININE URINE RATIO
Creatinine,U: 49.2 mg/dL
Microalb Creat Ratio: 228.8 mg/g — ABNORMAL HIGH (ref 0.0–30.0)
Microalb, Ur: 112.5 mg/dL — ABNORMAL HIGH (ref 0.0–1.9)

## 2017-07-29 LAB — BASIC METABOLIC PANEL
BUN: 12 mg/dL (ref 6–23)
CO2: 29 mEq/L (ref 19–32)
Calcium: 9.3 mg/dL (ref 8.4–10.5)
Chloride: 101 mEq/L (ref 96–112)
Creatinine, Ser: 0.68 mg/dL (ref 0.40–1.20)
GFR: 110.01 mL/min (ref >60.00)
Glucose, Bld: 569 mg/dL (ref 70–99)
Potassium: 4.3 mEq/L (ref 3.5–5.1)
Sodium: 136 mEq/L (ref 135–145)

## 2017-07-29 LAB — HEMOGLOBIN A1C: Hgb A1c MFr Bld: 14.4 % — ABNORMAL HIGH (ref 4.6–6.5)

## 2017-07-29 LAB — VITAMIN D 25 HYDROXY (VIT D DEFICIENCY, FRACTURES): VITD: 11.82 ng/mL — ABNORMAL LOW (ref 30.00–100.00)

## 2017-07-29 MED ORDER — LISINOPRIL 5 MG PO TABS: 5.0000 mg | ORAL_TABLET | Freq: Every day | ORAL | 0 refills | Status: DC

## 2017-07-29 MED ORDER — INSULIN GLARGINE 100 UNIT/ML SOLOSTAR PEN: 25.0000 [IU] | PEN_INJECTOR | Freq: Two times a day (BID) | SUBCUTANEOUS | 3 refills | Status: DC

## 2017-07-29 MED ORDER — VITAMIN D (ERGOCALCIFEROL) 1.25 MG (50000 UNIT) PO CAPS: ORAL_CAPSULE | ORAL | 2 refills | Status: DC

## 2017-07-29 MED ORDER — METFORMIN HCL 500 MG PO TABS: 500.0000 mg | ORAL_TABLET | Freq: Two times a day (BID) | ORAL | 1 refills | Status: DC

## 2017-07-29 MED ORDER — LIRAGLUTIDE 18 MG/3ML ~~LOC~~ SOPN: 1.8000 mg | PEN_INJECTOR | Freq: Every day | SUBCUTANEOUS | 3 refills | Status: DC

## 2017-07-29 NOTE — Assessment & Plan Note (Signed)
No changes in current management, will follow labs done today and will give further recommendations accordingly.  

## 2017-07-29 NOTE — Assessment & Plan Note (Addendum)
HgA1C has not been at goal. Today HgA1C 14.9,  We discussed possible complications of poorly controlled DM and the importance of compliance with medication and dietary recommendations. Lantus will be increased to 25 U bid, continue Victoza 1.8 mg daily,and resume Metformin  At 1000 mg bid. Regular exercise as tolerated. She is having dental work done. Recommend better controlled DM before having cataract surgery.  Annual eye exam, periodic dental and foot care recommended. F/U in 5-6 months

## 2017-07-29 NOTE — Telephone Encounter (Signed)
CRITICAL VALUE STICKER  CRITICAL VALUE: Glucose 569  RECEIVER (on-site recipient of call): Dorrene German, RN  DATE & TIME NOTIFIED: 07/29/17 3:23 PM  MESSENGER (representative from lab): Lowry Ram   MD NOTIFIED: Betty [name]  TIME OF NOTIFICATION: 3:44 PM  RESPONSE: Will await the rest of labs and provider will make determination regarding changing medication regimen.

## 2017-07-29 NOTE — Progress Notes (Signed)
HPI:   Sarah [name] is a 72 y.o. female, who is here today for 4 months follow up.   She was last seen on 03/16/2017  Since her last visit she has seen ophthalmologist for cataracts. She is hoping she can have her cataract surgery, which I recommended to hold on until her diabetes was better controlled.     Diabetes Mellitus II:  Chronic problem. Poorly controlled.  Currently on Victoza 1.8 mg daily and Lantus 15 U Am and 20 U pm. She is not longer on Metformin, states that it was discontinued after hospitalization in 03/2017.  Checking BS's : 200s Hypoglycemia: Denies  She has not been consistent with her diet, she loves cookies and cakes,she eat them a few times per week. Now she is drinking her coffee with no sugar.   She is tolerating medications well. She denies abdominal pain, nausea, vomiting, polydipsia, polyuria, or polyphagia. No numbness, tingling, or burning.  Last eye exam in 10/2016.    Lab Results  Component Value Date   HGBA1C 14.9 (H) 03/16/2017   Lab Results  Component Value Date   MICROALBUR 64.8 (H) 03/16/2017     Hypertension:  Currently on amlodipine 5 mg daily. S/P CVD,residual slurred speech and mildly unstable speech.  She is not checking BP at home. She is taking medications as instructed, no side effects reported.  She has not noted unusual headache, visual changes, exertional chest pain, dyspnea,  focal weakness, or edema.   Lab Results  Component Value Date   CREATININE 0.50 01/14/2017   BUN 11 01/14/2017   NA 141 01/14/2017   K 4.0 01/14/2017   CL 105 01/14/2017   CO2 29 12/08/2016    She completed PT in 04/2017. She does not using a cane or any assistance for transfers. She is already driving.  Vitamin D deficiency, currently she is on ergocalciferol 50,000 units weekly. I recommended then to increase frequency to q 3 days.  Last 25 OH vit D 03/2017 was low at 29.4.    Bilateral lower back pain,  which she states has had for a while. It is usually worse when she moves/gets up after prolonged resting and alleviated by movement. Pain is not radiated. No numbness or tingling on lower extremities. No saddle anesthesia or urine/bowel continence changes.  She denies fever,chills, urinary symptoms, or skin erythema/rash on affected area.  She has takes OTC Ibuprofen as needed.   Review of Systems  Constitutional: Negative for activity change, appetite change, fatigue and fever.  HENT: Negative for mouth sores, nosebleeds and trouble swallowing.   Eyes: Negative for redness and visual disturbance.  Respiratory: Negative for cough, shortness of breath and wheezing.   Cardiovascular: Negative for chest pain, palpitations and leg swelling.  Gastrointestinal: Negative for abdominal pain, nausea and vomiting.       Negative for changes in bowel habits.  Endocrine: Negative for polydipsia, polyphagia and polyuria.  Genitourinary: Negative for decreased urine volume, dysuria and hematuria.  Musculoskeletal: Positive for back pain and gait problem. Negative for myalgias.  Skin: Negative for rash and wound.  Neurological: Negative for syncope, weakness, numbness and headaches.  Psychiatric/Behavioral: Negative for confusion. The patient is not nervous/anxious.      Current Outpatient Medications on File Prior to Visit  Medication Sig Dispense Refill  . amLODipine (NORVASC) 5 MG tablet Take 1 tablet (5 mg total) by mouth daily. 90 tablet 1  . atorvastatin (LIPITOR) 40 MG tablet TAKE 1 TABLET(40  MG) BY MOUTH DAILY 90 tablet 0  . Blood Glucose Calibration (OT ULTRA/FASTTK CNTRL SOLN) SOLN     . clopidogrel (PLAVIX) 75 MG tablet TAKE 1 TABLET BY MOUTH DAILY 90 tablet 2  . EASY COMFORT INSULIN SYRINGE 30G X 1/2" 0.5 ML MISC     . ibuprofen (ADVIL,MOTRIN) 800 MG tablet TK 1 T PO Q 6 H PRN P  1  . Insulin Pen Needle (BD PEN NEEDLE NANO U/F) 32G X 4 MM MISC USE UP TO FOUR TIMES DAILY 200 each 0    . Lancet Devices (SIMPLE DIAGNOSTICS LANCING DEV) MISC     . ONE TOUCH ULTRA TEST test strip     . TRAVATAN Z 0.004 % SOLN ophthalmic solution Place 1 drop into both eyes at bedtime.  11   No current facility-administered medications on file prior to visit.      Past Medical History:  Diagnosis Date  . Boils   . Glaucoma   . Heart murmur   . HTN (hypertension)   . Hx of adenomatous colonic polyps   . Hyperlipidemia   . Osteoporosis   . Pneumonia   . Stroke ([place])   . Type II or unspecified type diabetes mellitus without mention of complication, uncontrolled    No Known Allergies  Social History   Socioeconomic History  . Marital status: Single    Spouse name: Not on file  . Number of children: Not on file  . Years of education: Not on file  . Highest education level: Not on file  Occupational History  . Not on file  Social Needs  . Financial resource strain: Not on file  . Food insecurity:    Worry: Not on file    Inability: Not on file  . Transportation needs:    Medical: Not on file    Non-medical: Not on file  Tobacco Use  . Smoking status: Former Smoker    Types: Cigarettes    Last attempt to quit: 10/15/2016    Years since quitting: 0.7  . Smokeless tobacco: Never Used  . Tobacco comment: smokes occ.   Substance and Sexual Activity  . Alcohol use: Yes    Alcohol/week: 0.0 oz    Comment: occ  . Drug use: No  . Sexual activity: Not on file  Lifestyle  . Physical activity:    Days per week: Not on file    Minutes per session: Not on file  . Stress: Not on file  Relationships  . Social connections:    Talks on phone: Not on file    Gets together: Not on file    Attends religious service: Not on file    Active member of club or organization: Not on file    Attends meetings of clubs or organizations: Not on file    Relationship status: Not on file  Other Topics Concern  . Not on file  Social History Narrative  . Not on file    Vitals:    07/29/17 0957  BP: 130/70  Pulse: 87  Resp: 16  Temp: 98.5 F (36.9 C)  SpO2: 95%   Body mass index is 27.63 kg/m.   Physical Exam  Nursing note and vitals reviewed. Constitutional: She is oriented to person, place, and time. She appears well-developed. No distress.  HENT:  Head: Normocephalic and atraumatic.  Mouth/Throat: Oropharynx is clear and moist and mucous membranes are normal.  Eyes: Pupils are equal, round, and reactive to light. Conjunctivae are normal.  Cardiovascular: Normal rate and regular rhythm.  No murmur heard. Pulses:      Dorsalis pedis pulses are 2+ on the right side, and 2+ on the left side.  Respiratory: Effort normal and breath sounds normal. No respiratory distress.  GI: Soft. She exhibits no mass. There is no hepatomegaly. There is no tenderness.  Musculoskeletal: She exhibits no edema.       Lumbar back: She exhibits tenderness (Mild,bilateral.). She exhibits no bony tenderness.       Back:  Lymphadenopathy:    She has no cervical adenopathy.  Neurological: She is alert and oriented to person, place, and time. She has normal strength.  Mildly unstable gait with no assistance.  Skin: Skin is warm. No rash noted. No erythema.  Psychiatric: She has a normal mood and affect. Her speech is slurred.  Well groomed, good eye contact.     ASSESSMENT AND PLAN:   Sarah [name] was seen today for 4 months follow-up.  Orders Placed This Encounter  Procedures  . Basic metabolic panel  . Hepatitis C antibody  . Hemoglobin A1c  . Microalbumin / creatinine urine ratio  . VITAMIN D 25 Hydroxy (Vit-D Deficiency, Fractures)   Lab Results  Component Value Date   MICROALBUR 112.5 (H) 07/29/2017   Lab Results  Component Value Date   HGBA1C 14.4 (H) 07/29/2017   Lab Results  Component Value Date   CREATININE 0.68 07/29/2017   BUN 12 07/29/2017   NA 136 07/29/2017   K 4.3 07/29/2017   CL 101 07/29/2017   CO2 29 07/29/2017    Type II  diabetes mellitus with nephropathy ([place]) HgA1C has not been at goal. Today HgA1C 14.9,  We discussed possible complications of poorly controlled DM and the importance of compliance with medication and dietary recommendations. Lantus will be increased to 25 U bid, continue Victoza 1.8 mg daily,and resume Metformin  At 1000 mg bid. Regular exercise as tolerated. She is having dental work done. Recommend better controlled DM before having cataract surgery.  Annual eye exam, periodic dental and foot care recommended. F/U in 5-6 months   Hypertension with heart disease Adequately controlled. No changes in current management. Low salt diet to continue. Eye exam recommended annually. F/U in 4 months, before if needed.   Vitamin D deficiency No changes in current management, will follow labs done today and will give further recommendations accordingly.   Low back pain It seems to be musculoskeletal. Since she has no Hx of trauma,I do  Not think imaging is needed today. OTC Icy Hot patch and Tylenol 650 mg tid as needed. Avoid NSAID's F/U as needed.   Gait disturbance, post-stroke Fall prevention discussed. She does not think she needs a cane,recommended.  Encounter for HCV screening test for high risk patient - Hepatitis C antibody       Gayl Ivanoff G. [name], MD  [facility]. [place] office.

## 2017-07-29 NOTE — Assessment & Plan Note (Signed)
Fall prevention discussed. She does not think she needs a cane,recommended.

## 2017-07-29 NOTE — Assessment & Plan Note (Signed)
Adequately controlled. No changes in current management. Low-salt diet to continue. Eye exam recommended annually. F/U in 4 months, before if needed.  

## 2017-07-29 NOTE — Patient Instructions (Signed)
A few things to remember from today's visit:   Uncontrolled type 2 diabetes mellitus with insulin therapy ([place]) - Plan: Basic metabolic panel, Hemoglobin A1c, Microalbumin / creatinine urine ratio  Hypertension with heart disease - Plan: Basic metabolic panel  Vitamin D deficiency - Plan: VITAMIN D 25 Hydroxy (Vit-D Deficiency, Fractures)  Encounter for HCV screening test for high risk patient - Plan: Hepatitis C antibody   Please be sure medication list is accurate. If a new problem present, please set up appointment sooner than planned today.

## 2017-07-29 NOTE — Assessment & Plan Note (Signed)
It seems to be musculoskeletal. Since she has no Hx of trauma,I do  Not think imaging is needed today. OTC Icy Hot patch and Tylenol 650 mg tid as needed. Avoid NSAID's F/U as needed.

## 2017-08-01 ENCOUNTER — Ambulatory Visit (INDEPENDENT_AMBULATORY_CARE_PROVIDER_SITE_OTHER): Payer: Medicare Other | Admitting: *Deleted

## 2017-08-01 DIAGNOSIS — I639 Cerebral infarction, unspecified: Secondary | ICD-10-CM

## 2017-08-01 LAB — HEPATITIS C ANTIBODY
Hepatitis C Ab: NONREACTIVE
SIGNAL TO CUT-OFF: 0.03 (ref <1.00)

## 2017-08-01 NOTE — Progress Notes (Signed)
[facility] Summary Report / Loop Recorder 

## 2017-08-03 LAB — CUP PACEART REMOTE DEVICE CHECK
Date Time Interrogation Session: 20190618234020
Implantable Pulse Generator Implant Date: 20181009

## 2017-09-02 ENCOUNTER — Ambulatory Visit (INDEPENDENT_AMBULATORY_CARE_PROVIDER_SITE_OTHER): Payer: Medicare Other | Admitting: *Deleted

## 2017-09-02 DIAGNOSIS — I639 Cerebral infarction, unspecified: Secondary | ICD-10-CM

## 2017-09-05 NOTE — Progress Notes (Signed)
[facility] Summary Report / Loop Recorder 

## 2017-09-07 NOTE — Progress Notes (Signed)
[facility]  PATIENT: TRINITA DEVLIN DOB: May 10, 1945   REASON FOR VISIT: Follow-up for stroke right MCA 10/2016 HISTORY FROM: Patient    HISTORY OF PRESENT ILLNESS: Ms.Graciella M Hendersonis a 72 y.o.femalewith history of DM, ongoing tobacco use, HLD, HTN admitted on 10/15/16 for left-sided weakness.  CT head showed focal hypodensity in the posterior right frontal WM deep white matter.  MRI showed multifocal acute infarct within the right MCA. MRA head distal right MCA M1 segment conclusion.  Carotid Doppler 40-59% right ICA stenosis.  However, CT of neck unremarkable.  EF 55-60%.  10/18/16 with worsening left sided weakness,  repeat MRI showed no significant change.  TEE unremarkable and loop recorder placed.  LDL 98 and A1c 12.9.  She was put on dual antiplatelet and Lipitor 40 on discharged to CIR.  Interval History 12/07/16 Dr Erlinda Hong During the interval time, the patient has been doing well.  Left sided weakness much improved.  Still has OT/PT/speech therapy as outpatient.  Has quit smoking.  BP still on the high side today in clinic 151/78.  Glucose not in good control, ranging from 200-270.  Patient has PCP follow-up tomorrow.  Patient complains of intermittent double vision, not sure if binocular or monocular.  Request for driving privilege. UPDATE 2/27/2019CM Ms. Christine, 72 year old female returns for follow-up with history of stroke in October 2018.  Her left-sided weakness is much improved however she continues with speech therapy occupational therapy and physical therapy.  She is no longer smoking.  She remains on Plavix for secondary stroke prevention without further stroke or TIA symptoms.  She has minimal bruising and no bleeding.  She is on Lipitor without myalgias.  Blood pressure in the office 130/75.  Blood sugars continue to be a problem CBGs range 199- 200 she claims she is compliant with her insulin.  She is back to driving during the day.   Loop  recorder so far no atrial fibrillation.  She returns for reevaluation without new neurologic symptoms UPDATE 8/29/2019CM Ms. Kocurek, 72 year old female returns for follow-up with history of stroke in October 2018.  Her left-sided weakness has improved her speech has improved she is quit smoking in October of last year.  She remains on Plavix for secondary stroke prevention with minimal bruising and no bleeding and no further stroke or TIA symptoms.  She remains on Lipitor without myalgias blood pressure in the office today 128/62.  Blood sugars continue to be her biggest problem.  Most recent hemoglobin A1c 14.4 on 07/29/2017.  She has had diabetic training/education in the past.  Loop recorder so far no atrial fibrillation.  She is back to her normal activities of daily living.  [facility].  She returns for reevaluation   REVIEW OF SYSTEMS: Full 14 system review of systems performed and notable only for those listed, all others are neg:  Constitutional: neg  Cardiovascular: neg Ear/Nose/Throat: neg  Skin: neg Eyes: neg Respiratory: neg Gastroitestinal: neg  Hematology/Lymphatic: neg  Endocrine: neg Musculoskeletal:neg Allergy/Immunology: neg Neurological: neg Psychiatric: neg Sleep : neg   ALLERGIES: No Known Allergies  HOME MEDICATIONS:   PAST MEDICAL HISTORY: Past Medical History:  Diagnosis Date  . Boils   . Glaucoma   . Heart murmur   . HTN (hypertension)   . Hx of adenomatous colonic polyps   . Hyperlipidemia   . Osteoporosis   . Pneumonia   . Stroke ([place])   . Type II  or unspecified type diabetes mellitus without mention of complication, uncontrolled     PAST SURGICAL HISTORY: Past Surgical History:  Procedure Laterality Date  . ABDOMINAL HYSTERECTOMY    . CARPAL TUNNEL RELEASE     right  . COLONOSCOPY  12-10-10   per Dr. Deatra Ina, clear, repeat in 7 yrs   . INCISION AND DRAINAGE PERIRECTAL ABSCESS N/A 09/26/2015    Procedure: IRRIGATION AND DEBRIDEMENT PERIRECTAL ABSCESS;  Surgeon: Mickeal Skinner, MD;  Location: [facility];  Service: General;  Laterality: N/A;  . KNEE ARTHROSCOPY     right  . LOOP RECORDER INSERTION N/A 10/19/2016   Procedure: LOOP RECORDER INSERTION;  Surgeon: Thompson Grayer, MD;  Location: [facility];  Service: Cardiovascular;  Laterality: N/A;  . ORIF ANKLE FRACTURE Right 03/24/2012   Procedure: OPEN REDUCTION INTERNAL FIXATION (ORIF) ANKLE FRACTURE;  Surgeon: Newt Minion, MD;  Location: [facility];  Service: Orthopedics;  Laterality: Right;  Open Reduction Internal Fixation Right Bimalleolar ankle fracture  . POLYPECTOMY    . TEE WITHOUT CARDIOVERSION N/A 10/18/2016   Procedure: TRANSESOPHAGEAL ECHOCARDIOGRAM (TEE);  Surgeon: Fay Records, MD;  Location: [facility];  Service: Cardiovascular;  Laterality: N/A;  . TONSILLECTOMY      FAMILY HISTORY: Family History  Problem Relation Age of Onset  . Diabetes Mother   . Hypertension Mother   . Stroke Father   . Stroke Maternal Uncle   . Stroke Paternal Uncle   . Colon cancer Neg Hx   . Esophageal cancer Neg Hx   . Rectal cancer Neg Hx   . Stomach cancer Neg Hx     SOCIAL HISTORY: Social History   Socioeconomic History  . Marital status: Single    Spouse name: Not on file  . Number of children: Not on file  . Years of education: Not on file  . Highest education level: Not on file  Occupational History  . Not on file  Social Needs  . Financial resource strain: Not on file  . Food insecurity:    Worry: Not on file    Inability: Not on file  . Transportation needs:    Medical: Not on file    Non-medical: Not on file  Tobacco Use  . Smoking status: Former Smoker    Types: Cigarettes    Last attempt to quit: 10/15/2016    Years since quitting: 0.8  . Smokeless tobacco: Never Used  . Tobacco comment: smokes occ.   Substance and Sexual Activity  . Alcohol use: Yes    Alcohol/week: 0.0 standard drinks     Comment: occ  . Drug use: No  . Sexual activity: Not on file  Lifestyle  . Physical activity:    Days per week: Not on file    Minutes per session: Not on file  . Stress: Not on file  Relationships  . Social connections:    Talks on phone: Not on file    Gets together: Not on file    Attends religious service: Not on file    Active member of club or organization: Not on file    Attends meetings of clubs or organizations: Not on file    Relationship status: Not on file  . Intimate partner violence:    Fear of current or ex partner: Not on file    Emotionally abused: Not on file    Physically abused: Not on file    Forced sexual activity: Not on file  Other Topics Concern  .  Not on file  Social History Narrative  . Not on file     PHYSICAL EXAM  Vitals:   09/08/17 1456  BP: 128/62  Pulse: 82  Weight: 128 lb (58.1 kg)  Height: 5' (1.524 m)   Body mass index is 25 kg/m.  Generalized: Well developed, in no acute distress  Head: normocephalic and atraumatic,. Oropharynx benign  Neck: Supple, no carotid bruits  Cardiac: Regular rate rhythm, no murmur  Musculoskeletal: No deformity   Neurological examination   Mentation: Alert oriented to time, place, history taking. Attention span and concentration appropriate. Recent and remote memory intact.  Follows all commands speech and language fluent.   Cranial nerve II-XII: Pupils were equal round reactive to light extraocular movements were full, visual field were full on confrontational test.  No  facial droop . hearing was intact to finger rubbing bilaterally. Uvula tongue midline. head turning and shoulder shrug were normal and symmetric.Tongue protrusion into cheek strength was normal. Motor: normal bulk and tone, full strength in the BUE, BLE,  . Sensory: normal and symmetric to light touch, pinprick, and  Vibration, in the upper and lower extremities Coordination: finger-nose-finger, heel-to-shin bilaterally, no  dysmetria, no tremor Reflexes: 1+ upper lower and symmetric plantar responses were flexor bilaterally. Gait and Station: Rising up from seated position without assistance, normal stance,  moderate stride, good arm swing, smooth turning, able to perform tiptoe, and heel walking without difficulty. Tandem gait is mildly unsteady.  No assistive device  DIAGNOSTIC DATA (LABS, IMAGING, TESTING) - I reviewed patient records, labs, notes, testing and imaging myself where available.  Lab Results  Component Value Date   WBC 5.5 12/08/2016   HGB 13.9 01/14/2017   HCT 41.0 01/14/2017   MCV 94.6 12/08/2016   PLT 307.0 12/08/2016      Component Value Date/Time   NA 136 07/29/2017 1041   K 4.3 07/29/2017 1041   CL 101 07/29/2017 1041   CO2 29 07/29/2017 1041   GLUCOSE 569 (HH) 07/29/2017 1041   BUN 12 07/29/2017 1041   CREATININE 0.68 07/29/2017 1041   CALCIUM 9.3 07/29/2017 1041   PROT 5.7 (L) 10/19/2016 2353   ALBUMIN 2.7 (L) 10/19/2016 2353   AST 14 (L) 10/19/2016 2353   ALT 9 (L) 10/19/2016 2353   ALKPHOS 63 10/19/2016 2353   BILITOT 0.4 10/19/2016 2353   GFRNONAA >60 10/19/2016 2353   GFRAA >60 10/19/2016 2353   Lab Results  Component Value Date   CHOL 156 10/16/2016   HDL 37 (L) 10/16/2016   LDLCALC 98 10/16/2016   TRIG 104 10/16/2016   CHOLHDL 4.2 10/16/2016   Lab Results  Component Value Date   HGBA1C 14.4 (H) 07/29/2017        ASSESSMENT AND PLAN 72 y.o. African American female with PMH of DM, ongoing tobacco use, HLD, HTN admitted on 10/15/16 for multifocal acute infarct within the right MCA. MRA head distal right MCA M1 segment conclusion.  Carotid Doppler 40-59% right ICA stenosis.  However, CT of neck unremarkable.  EF 55-60%.  10/18/16 with worsening left sided weakness,  repeat MRI showed no significant change.  TEE unremarkable and loop recorder placed.  LDL 98 and A1c 12.9.  She was put on dual antiplatelet and Lipitor 40 on discharged to CIR. Left sided weakness  much improved.    Has quit smoking.  BP in good control. Glucose not in good control,.  Loop recorder so far no afib.. The patient is a current patient of Dr.  Xu  who no longer works at this office.  This note is sent to the work in doctor.     Plan:  Continue plavix for stroke prevention Continue lipitor for stroke prevention Check BP and glucose at home and record, Today s B/P reading 128/62 continue meds Follow up with your primary care physician for stroke risk factor modification. Recommend maintain blood pressure goal <130/80, diabetes with hemoglobin A1c goal below 7.0% and lipids with LDL cholesterol goal below 70 mg/dL.  Loop recorder so far no atrial fib Continue exercise program at [facility] 3xweek walking etc. Try to get 30 min daily Diabetic diet and regular exercise Most recent HgbA1C 14.4 on 07/29/2017 needs to be less than 7  Will discharge from stroke clinic I spent 25 minutes in total face to face time with the patient more than 50% of which was spent counseling and coordination of care, reviewing test results reviewing medications and discussing and reviewing the diagnosis of stroke and management of risk factors and further treatment options.  Praised  for continuing smoking cessation.  Given written information on stroke prevention as well Dennie Bible, [facility], [facility], APRN  [facility] [address] [place], [place] [address] [phone]

## 2017-09-08 ENCOUNTER — Ambulatory Visit: Payer: Medicare Other | Admitting: Nurse Practitioner

## 2017-09-08 ENCOUNTER — Encounter: Payer: Self-pay | Admitting: Nurse Practitioner

## 2017-09-08 VITALS — BP 128/62 | HR 82 | Ht 60.0 in | Wt 128.0 lb

## 2017-09-08 DIAGNOSIS — I63511 Cerebral infarction due to unspecified occlusion or stenosis of right middle cerebral artery: Secondary | ICD-10-CM | POA: Diagnosis not present

## 2017-09-08 DIAGNOSIS — I10 Essential (primary) hypertension: Secondary | ICD-10-CM | POA: Diagnosis not present

## 2017-09-08 DIAGNOSIS — E785 Hyperlipidemia, unspecified: Secondary | ICD-10-CM

## 2017-09-08 NOTE — Patient Instructions (Addendum)
Continue plavix for stroke prevention Continue lipitor for stroke prevention Check BP and glucose at home and record, Today s reading 128/62 continue meds Follow up with your primary care physician for stroke risk factor modification. Recommend maintain blood pressure goal <130/80, diabetes with hemoglobin A1c goal below 7.0% and lipids with LDL cholesterol goal below 70 mg/dL.  Loop recorder so far no atrial fib Continue exercise program at [facility] 3xweek walking etc. Try to get 30 min daily Diabetic diet and regular exercise Most recent HgbA1C 14.4 on 07/29/2017 needs to be less than 7  Will discharge from stroke clinic Stroke Prevention Some health problems and behaviors may make it more likely for you to have a stroke. Below are ways to lessen your risk of having a stroke.  Be active for at least 30 minutes on most or all days.  Do not smoke. Try not to be around others who smoke.  Do not drink too much alcohol. ? Do not have more than 2 drinks a day if you are a man. ? Do not have more than 1 drink a day if you are a woman and are not pregnant.  Eat healthy foods, such as fruits and vegetables. If you were put on a specific diet, follow the diet as told.  Keep your cholesterol levels under control through diet and medicines. Look for foods that are low in saturated fat, trans fat, cholesterol, and are high in fiber.  If you have diabetes, follow all diet plans and take your medicine as told.  Ask your doctor if you need treatment to lower your blood pressure. If you have high blood pressure (hypertension), follow all diet plans and take your medicine as told by your doctor.  If you are 27-75 years old, have your blood pressure checked every 3-5 years. If you are age 63 or older, have your blood pressure checked every year.  Keep a healthy weight. Eat foods that are low in calories, salt, saturated fat, trans fat, and cholesterol.  Do not take drugs.  Avoid birth control pills, if  this applies. Talk to your doctor about the risks of taking birth control pills.  Talk to your doctor if you have sleep problems (sleep apnea).  Take all medicine as told by your doctor. ? You may be told to take aspirin or blood thinner medicine. Take this medicine as told by your doctor. ? Understand your medicine instructions.  Make sure any other conditions you have are being taken care of.  Get help right away if:  You suddenly lose feeling (you feel numb) or have weakness in your face, arm, or leg.  Your face or eyelid hangs down to one side.  You suddenly feel confused.  You have trouble talking (aphasia) or understanding what people are saying.  You suddenly have trouble seeing in one or both eyes.  You suddenly have trouble walking.  You are dizzy.  You lose your balance or your movements are clumsy (uncoordinated).  You suddenly have a very bad headache and you do not know the cause.  You have new chest pain.  Your heart feels like it is fluttering or skipping a beat (irregular heartbeat). Do not wait to see if the symptoms above go away. Get help right away. Call your local emergency services (911 in [place] Do not drive yourself to the hospital. This information is not intended to replace advice given to you by your health care provider. Make sure you discuss any questions you have  with your health care provider. Document Released: 06/29/2011 Document Revised: 06/05/2015 Document Reviewed: 06/30/2012 Elsevier Interactive Patient Education  [employer].

## 2017-09-08 NOTE — Progress Notes (Signed)
I have read the note, and I agree with the clinical assessment and plan.  Aalyah Mansouri A. Carlye Panameno, MD, PhD, FAAN Certified in Neurology, Clinical Neurophysiology, Sleep Medicine, Pain Medicine and Neuroimaging  [facility] [address], [address] [place], [place] [address] [phone]  

## 2017-09-16 LAB — CUP PACEART REMOTE DEVICE CHECK
Date Time Interrogation Session: 20190721234025
Implantable Pulse Generator Implant Date: 20181009

## 2017-09-19 ENCOUNTER — Other Ambulatory Visit: Payer: Self-pay | Admitting: *Deleted

## 2017-09-19 MED ORDER — INSULIN PEN NEEDLE 32G X 4 MM MISC: 2 refills | Status: DC

## 2017-09-19 MED ORDER — ALCOHOL PREP PADS: MEDICATED_PAD | 2 refills | Status: DC

## 2017-09-20 ENCOUNTER — Ambulatory Visit: Payer: Self-pay | Admitting: Family Medicine

## 2017-09-20 NOTE — Telephone Encounter (Signed)
Pt called with c/o nasal congestion and right hand pain. Nasal discharge started 2 days ago and stated that she is having a large amount of nasal drainage. Pt denies cough, Shortness of breath, or fever. Pt stated that she feel tires and wants to rest.  Home care advice given and pt verbalized understanding.  Pt also called about right hand and finger pain. Pt has been using a medicine called "thera work" which does offer some relief. Pt has pain anytime she has to use it or when moving her fingers. Pt denies neck pain, swelling, rash, numbness or fever. Pt stated pain started 2 weeks ago.  Care advice given to pt and pt verbalized understanding. Appt made for pt 09/23/17 at 2 pm.  Reason for Disposition . Cold with no complications . [1] MODERATE pain (e.g., interferes with normal activities) AND [2] present > 3 days  Answer Assessment - Initial Assessment Questions 1. ONSET: "When did the nasal discharge start?"      2 days ago 2. AMOUNT: "How much discharge is there?"      Pt states she is having a large anount 3. COUGH: "Do you have a cough?" If yes, ask: "Describe the color of your sputum" (clear, white, yellow, green)     no 4. RESPIRATORY DISTRESS: "Describe your breathing."      No SOB or wheezing 5. FEVER: "Do you have a fever?" If so, ask: "What is your temperature, how was it measured, and when did it start?"     no 6. SEVERITY: "Overall, how bad are you feeling right now?" (e.g., doesn't interfere with normal activities, staying home from school/work, staying in bed)      Just tired and wants to lay down and rest 7. OTHER SYMPTOMS: "Do you have any other symptoms?" (e.g., sore throat, earache, wheezing, vomiting)     no 8. PREGNANCY: "Is there any chance you are pregnant?" "When was your last menstrual period?"     n/a  Answer Assessment - Initial Assessment Questions 1. ONSET: "When did the pain start?"     2 weeks ago 2. LOCATION: "Where is the pain located?"     Right hand  and fingers 3. PAIN: "How bad is the pain?" (Scale 1-10; or mild, moderate, severe)   - MILD (1-3): doesn't interfere with normal activities   - MODERATE (4-7): interferes with normal activities (e.g., work or school) or awakens from sleep   - SEVERE (8-10): excruciating pain, unable to use hand at all moderate 4. WORK OR EXERCISE: "Has there been any recent work or exercise that involved this part of the body?"     no 5. CAUSE: "What do you think is causing the pain?"     arthritis 6. AGGRAVATING FACTORS: "What makes the pain worse?" (e.g., using computer)     Anytime she has to use it or moving fingers up and down  7. OTHER SYMPTOMS: "Do you have any other symptoms?" (e.g., neck pain, swelling, rash, numbness, fever)     no 8. PREGNANCY: "Is there any chance you are pregnant?" "When was your last menstrual period?"     n/a  Protocols used: COMMON COLD-A-AH, HAND AND WRIST PAIN-A-AH

## 2017-09-23 ENCOUNTER — Ambulatory Visit: Payer: Medicare Other | Admitting: Family Medicine

## 2017-09-26 ENCOUNTER — Ambulatory Visit (INDEPENDENT_AMBULATORY_CARE_PROVIDER_SITE_OTHER): Payer: Medicare Other | Admitting: Family Medicine

## 2017-09-26 ENCOUNTER — Ambulatory Visit (INDEPENDENT_AMBULATORY_CARE_PROVIDER_SITE_OTHER): Payer: Medicare Other

## 2017-09-26 ENCOUNTER — Encounter: Payer: Self-pay | Admitting: Family Medicine

## 2017-09-26 VITALS — BP 122/70 | HR 87 | Temp 98.9°F | Resp 12 | Ht 60.0 in | Wt 139.2 lb

## 2017-09-26 DIAGNOSIS — M6530 Trigger finger, unspecified finger: Secondary | ICD-10-CM | POA: Diagnosis not present

## 2017-09-26 DIAGNOSIS — M79641 Pain in right hand: Secondary | ICD-10-CM | POA: Diagnosis not present

## 2017-09-26 DIAGNOSIS — E1121 Type 2 diabetes mellitus with diabetic nephropathy: Secondary | ICD-10-CM | POA: Diagnosis not present

## 2017-09-26 DIAGNOSIS — R059 Cough, unspecified: Secondary | ICD-10-CM

## 2017-09-26 DIAGNOSIS — R05 Cough: Secondary | ICD-10-CM

## 2017-09-26 MED ORDER — DULAGLUTIDE 1.5 MG/0.5ML ~~LOC~~ SOAJ: 1.5000 mg | SUBCUTANEOUS | 1 refills | Status: DC

## 2017-09-26 MED ORDER — INSULIN PEN NEEDLE 32G X 4 MM MISC: 2 refills | Status: DC

## 2017-09-26 MED ORDER — INSULIN LISPRO 100 UNIT/ML CARTRIDGE: 5.0000 [IU] | Freq: Three times a day (TID) | SUBCUTANEOUS | 6 refills | Status: DC

## 2017-09-26 MED ORDER — ONETOUCH ULTRA BLUE VI STRP: ORAL_STRIP | 6 refills | Status: DC

## 2017-09-26 MED ORDER — SIMPLE DIAGNOSTICS LANCING DEV MISC: 6 refills | Status: DC

## 2017-09-26 NOTE — Patient Instructions (Addendum)
A few things to remember from today's visit:   Right hand pain - Plan: DG Hand Complete Right, Ambulatory referral to Orthopedic Surgery  Type II diabetes mellitus with nephropathy (HCC)  Trigger finger of right hand, unspecified finger - Plan: Ambulatory referral to Orthopedic Surgery Resumed Trulicity weekly 1.5 mg and we added Humalog 5 to 10 units with meals. Check your blood sugar before meals and 2 hours after.  Please be sure medication list is accurate. If a new problem present, please set up appointment sooner than planned today.

## 2017-09-26 NOTE — Progress Notes (Signed)
ACUTE VISIT   HPI:  Chief Complaint  Patient presents with  . Hand Pain    Right    Sarah [name] is a 72 y.o. female, who is here today complaining of 2-3 weeks of right hand pain and limitation of movement. She denies history of trauma. Negative for erythema, sometimes "a little" swelling. She is having difficulty with opening jars.  Hx of CVA with left sided weakness, which improved greatly with PT.  She mentions that in the past she has been treated for trigger finger. She is taking Tylenol as needed, which helps.  She feels like problem is getting worse.   She is also concerned about DM 2, she has not been able to have cataract surgery due to elevated A1c. She is not taking Victoza. Metformin is causing intermittent abdominal cramps and diarrhea, no nausea or vomiting. BS 200s to 300s. She is on Lantus 30 units twice daily.  In the past she was on Trulicity but she was having trouble affording medication.   Lab Results  Component Value Date   HGBA1C 14.4 (H) 07/29/2017   Cough: Last week she had a "cold", symptoms have resolved except for the cough. She also has some postnasal drainage. No fever, chills, changes in appetite, or fatigue. Negative for dyspnea, chest pain, or wheezing. She has not taken OTC medication.   Review of Systems  Constitutional: Positive for fatigue. Negative for activity change, appetite change and fever.  HENT: Positive for postnasal drip. Negative for mouth sores, nosebleeds, sinus pressure, sore throat and trouble swallowing.   Respiratory: Positive for cough. Negative for shortness of breath and wheezing.   Cardiovascular: Negative for chest pain, palpitations and leg swelling.  Gastrointestinal: Positive for abdominal pain and diarrhea. Negative for nausea and vomiting.       Negative for changes in bowel habits.  Genitourinary: Negative for decreased urine volume, dysuria and hematuria.  Musculoskeletal:  Positive for arthralgias and joint swelling.  Skin: Negative for rash and wound.  Neurological: Negative for syncope and headaches.  Psychiatric/Behavioral: Negative for confusion. The patient is nervous/anxious.       Current Outpatient Medications on File Prior to Visit  Medication Sig Dispense Refill  . amLODipine (NORVASC) 5 MG tablet Take 1 tablet (5 mg total) by mouth daily. 90 tablet 1  . atorvastatin (LIPITOR) 40 MG tablet TAKE 1 TABLET(40 MG) BY MOUTH DAILY 90 tablet 0  . Blood Glucose Calibration (OT ULTRA/FASTTK CNTRL SOLN) SOLN     . clopidogrel (PLAVIX) 75 MG tablet TAKE 1 TABLET BY MOUTH DAILY 90 tablet 2  . ibuprofen (ADVIL,MOTRIN) 800 MG tablet TK 1 T PO Q 6 H PRN P  1  . Insulin Glargine (LANTUS) 100 UNIT/ML Solostar Pen Inject 25 Units into the skin 2 (two) times daily. 15 mL 3  . lisinopril (PRINIVIL,ZESTRIL) 5 MG tablet Take 1 tablet (5 mg total) by mouth daily. 90 tablet 0  . NON FORMULARY     . PHARMACIST CHOICE ALCOHOL 70 % PADS     . TRAVATAN Z 0.004 % SOLN ophthalmic solution Place 1 drop into both eyes at bedtime.  11  . Vitamin D, Ergocalciferol, (DRISDOL) 50000 units CAPS capsule TAKE ONE CAPSULE BY MOUTH TWICE A WEEK ON MONDAY AND THURSDAY 25 capsule 2   No current facility-administered medications on file prior to visit.      Past Medical History:  Diagnosis Date  . Boils   . Glaucoma   .  Heart murmur   . HTN (hypertension)   . Hx of adenomatous colonic polyps   . Hyperlipidemia   . Osteoporosis   . Pneumonia   . Stroke ([place])   . Type II or unspecified type diabetes mellitus without mention of complication, uncontrolled    No Known Allergies  Social History   Socioeconomic History  . Marital status: Single    Spouse name: Not on file  . Number of children: Not on file  . Years of education: Not on file  . Highest education level: Not on file  Occupational History  . Not on file  Social Needs  . Financial resource strain: Not on file    . Food insecurity:    Worry: Not on file    Inability: Not on file  . Transportation needs:    Medical: Not on file    Non-medical: Not on file  Tobacco Use  . Smoking status: Former Smoker    Types: Cigarettes    Last attempt to quit: 10/15/2016    Years since quitting: 0.9  . Smokeless tobacco: Never Used  . Tobacco comment: smokes occ.   Substance and Sexual Activity  . Alcohol use: Yes    Alcohol/week: 0.0 standard drinks    Comment: occ  . Drug use: No  . Sexual activity: Not on file  Lifestyle  . Physical activity:    Days per week: Not on file    Minutes per session: Not on file  . Stress: Not on file  Relationships  . Social connections:    Talks on phone: Not on file    Gets together: Not on file    Attends religious service: Not on file    Active member of club or organization: Not on file    Attends meetings of clubs or organizations: Not on file    Relationship status: Not on file  Other Topics Concern  . Not on file  Social History Narrative  . Not on file    Vitals:   09/26/17 1158  BP: 122/70  Pulse: 87  Resp: 12  Temp: 98.9 F (37.2 C)  SpO2: 96%   Body mass index is 27.2 kg/m.   Physical Exam  Nursing note and vitals reviewed. Constitutional: She is oriented to person, place, and time. She appears well-developed. No distress.  HENT:  Head: Normocephalic and atraumatic.  Mouth/Throat: Uvula is midline, oropharynx is clear and moist and mucous membranes are normal.  Small aphthae x 2 noted on soft palate,left-sided.  Eyes: Conjunctivae are normal.  Cardiovascular: Normal rate and regular rhythm.  No murmur heard. Respiratory: Effort normal and breath sounds normal. No respiratory distress.  GI: Soft. She exhibits no mass. There is no hepatomegaly. There is no tenderness.  Musculoskeletal: She exhibits edema (Trace pitting LE edema,bilateral.).  Right hand: Tenderness upon palpation of index MCP. No signs of synovitis. Limitation of  flexion of MCP joints 2-5th,,worse 2nd upon palpation along 2nd metacarpus mild thickened tendons with nodular like pattern. No fixed contractures appreciated. Limited active extension, clicking sensation upon flexion and extension movement index.Noted upon flexion 4th. No tenderness upon palpation.   Neurological: She is alert and oriented to person, place, and time. She has normal strength.  Mildly unstable gait with no assistance.  Skin: Skin is warm. No rash noted. No erythema.  Psychiatric: Her mood appears anxious.  Well groomed, good eye contact.    ASSESSMENT AND PLAN:   Sarah [name] was seen today for hand pain.  Diagnoses and all orders for this visit:  Right hand pain  We discussed possible etiologies, most likely related to OA. She would like to have an x-ray done today. Continue Tylenol 650 mg 3 times daily.  -     DG Hand Complete Right; Future -     Ambulatory referral to Orthopedic Surgery  Trigger finger of right hand, unspecified finger  We discussed diagnosis and treatment options. Recommend orthopedist (hand specialist) evaluation.  -     Ambulatory referral to Orthopedic Surgery  Type II diabetes mellitus with nephropathy ([place]) Poorly controlled. She has not been compliant with treatment and dietary recommendations. She would like to go back to Trulicity. Novolin 5 to 10 units with meals 3 times daily. No changes in Lantus except for taking her dose once instead twice daily. She will keep appointment she has in 11/2017.  Cough  His lung auscultation today is negative. I do not think imaging is needed today. Explained that cough can last a few days and even weeks after URI. Instructed about warning signs. Follow-up as needed.     Return if symptoms worsen or fail to improve, for Keep next appointment.     -Sarah [name] was advised to seek immediate medical attention if sudden worsening symptoms or to follow if they persist or  if new concerns arise.       Sherronda Sweigert G. [name], MD  [facility]. [place] office.

## 2017-09-26 NOTE — Assessment & Plan Note (Addendum)
Poorly controlled. She has not been compliant with treatment and dietary recommendations. Metformin discontinued due to side effects. She would like to go back to Trulicity. Novolin 5 to 10 units with meals 3 times daily. No changes in Lantus except for taking her dose once instead twice daily. She will keep appointment she has in 11/2017.

## 2017-10-04 LAB — CUP PACEART REMOTE DEVICE CHECK
Date Time Interrogation Session: 20190824001103
Implantable Pulse Generator Implant Date: 20181009

## 2017-10-05 ENCOUNTER — Ambulatory Visit (INDEPENDENT_AMBULATORY_CARE_PROVIDER_SITE_OTHER): Payer: Medicare Other | Admitting: *Deleted

## 2017-10-05 DIAGNOSIS — I639 Cerebral infarction, unspecified: Secondary | ICD-10-CM

## 2017-10-06 NOTE — Progress Notes (Signed)
[facility] Summary Report / Loop Recorder 

## 2017-10-09 ENCOUNTER — Other Ambulatory Visit: Payer: Self-pay | Admitting: Family Medicine

## 2017-10-10 ENCOUNTER — Ambulatory Visit (INDEPENDENT_AMBULATORY_CARE_PROVIDER_SITE_OTHER): Payer: Medicare Other | Admitting: Orthopaedic Surgery

## 2017-10-10 LAB — CUP PACEART REMOTE DEVICE CHECK
Date Time Interrogation Session: 20190926004045
Implantable Pulse Generator Implant Date: 20181009

## 2017-10-17 ENCOUNTER — Ambulatory Visit (INDEPENDENT_AMBULATORY_CARE_PROVIDER_SITE_OTHER): Payer: Medicare Other | Admitting: Orthopaedic Surgery

## 2017-10-17 ENCOUNTER — Encounter (INDEPENDENT_AMBULATORY_CARE_PROVIDER_SITE_OTHER): Payer: Self-pay | Admitting: Orthopaedic Surgery

## 2017-10-17 DIAGNOSIS — M65321 Trigger finger, right index finger: Secondary | ICD-10-CM | POA: Diagnosis not present

## 2017-10-17 NOTE — Progress Notes (Signed)
The patient someone I seen before.  She comes in today with triggering of her right index finger and pain over the A1 pulley.  She is only consider injection in this area today she is had one in the past.  Last year she did have a stroke.  She is a type II diabetic.  She is on Plavix.  Today she reports that her blood glucose has been running high and it was in the 270s earlier today.  On exam she does have pain over the A1 pulley of her right hand index finger with no wounds.  There was some mild triggering  She understands that her blood glucose is too high for [place] to safely provide a steroid injection in this area.  I counseled her about her blood glucose and we can see her back in 2 weeks to see if it is low enough to consider injection.  I would like it to be below 200.  I did give her some samples of the topical anti-inflammatory to try.  She cannot take traditional anti-inflammatories but can take Tylenol.

## 2017-10-31 ENCOUNTER — Ambulatory Visit (INDEPENDENT_AMBULATORY_CARE_PROVIDER_SITE_OTHER): Payer: Medicare Other | Admitting: Orthopaedic Surgery

## 2017-11-07 ENCOUNTER — Ambulatory Visit (INDEPENDENT_AMBULATORY_CARE_PROVIDER_SITE_OTHER): Payer: Medicare Other | Admitting: *Deleted

## 2017-11-07 DIAGNOSIS — I639 Cerebral infarction, unspecified: Secondary | ICD-10-CM | POA: Diagnosis not present

## 2017-11-08 NOTE — Progress Notes (Signed)
[facility] Summary Report / Loop Recorder 

## 2017-11-10 ENCOUNTER — Ambulatory Visit (INDEPENDENT_AMBULATORY_CARE_PROVIDER_SITE_OTHER): Payer: Medicare Other | Admitting: Orthopaedic Surgery

## 2017-11-23 ENCOUNTER — Encounter (INDEPENDENT_AMBULATORY_CARE_PROVIDER_SITE_OTHER): Payer: Self-pay | Admitting: Orthopaedic Surgery

## 2017-11-23 ENCOUNTER — Ambulatory Visit (INDEPENDENT_AMBULATORY_CARE_PROVIDER_SITE_OTHER): Payer: Medicare Other | Admitting: Orthopaedic Surgery

## 2017-11-23 DIAGNOSIS — M65321 Trigger finger, right index finger: Secondary | ICD-10-CM

## 2017-11-23 NOTE — Progress Notes (Signed)
Office Visit Note   Patient: Sarah [name]           Date of Birth: 04-Oct-1945           MRN: [id number] Visit Date: 11/23/2017              Requested by: [name], [name], MD [address] [place], [place] [address] PCP: [name], [name], MD   Assessment & Plan: Visit Diagnoses:  1. Trigger finger, right index finger     Plan: We did provide an injection in her right index finger A1 pulley without difficulty.  She is going to watch her blood glucose closely knowing that it can increase her blood sugars.  All question concerns were answered and addressed.  Follow-up will otherwise be as needed.  Follow-Up Instructions: Return if symptoms worsen or fail to improve.   Orders:  No orders of the defined types were placed in this encounter.  No orders of the defined types were placed in this encounter.     Procedures: No procedures performed   Clinical Data: No additional findings.   Subjective: Chief Complaint  Patient presents with  . Right Index Finger - Pain, Follow-up  Patient comes in today with known index finger trigger finger.  She has had pain at the A1 pulley.  I saw her a few weeks ago but her blood glucose have been running in the upper 270s and was not safe to provide an injection then.  She still having triggering and pain.  The topical anti-inflammatories to help a little bit.  She says her blood glucose is now running about 200.  She looks better overall and I do feel comfortable providing injection today.  HPI  Review of Systems She has been recording her blood sugars 3 times a day now.  She denies any chest pain or shortness of breath or fever or chills.  Objective: Vital Signs: There were no vitals taken for this visit.  Physical Exam She is alert and oriented x3 and in no acute distress Ortho Exam Examination of her right hand does show pain at the A1 pulley.  There is active triggering.  She is otherwise neurovascularly  intact. Specialty Comments:  No specialty comments available.  Imaging: No results found.   PMFS History: Patient Active Problem List   Diagnosis Date Noted  . Trigger finger, right index finger 10/17/2017  . Low back pain 07/29/2017  . Hemiparesis affecting left side as late effect of cerebrovascular accident (CVA) ([place]) 11/11/2016  . Gait disturbance, post-stroke 11/11/2016  . Dysarthria as late effect of stroke 11/11/2016  . Carotid artery stenosis 11/02/2016  . Right middle cerebral artery stroke ([place]) 10/19/2016  . Left arm weakness   . Diastolic dysfunction   . Hypertension with heart disease   . Tobacco abuse   . Acute blood loss anemia   . Cerebral infarction due to embolism of right middle cerebral artery ([place])   . Stroke (cerebrum) ([place]) 10/15/2016  . Perirectal abscess 09/26/2015  . Vitamin D deficiency 07/23/2015  . Osteopenia 02/16/2013  . Ankle fracture 01/19/2013  . Anemia 09/27/2011  . Type II diabetes mellitus with nephropathy ([place]) 09/24/2011  . Hyperlipidemia 09/24/2011  . Hypertension 09/24/2011   Past Medical History:  Diagnosis Date  . Boils   . Glaucoma   . Heart murmur   . HTN (hypertension)   . Hx of adenomatous colonic polyps   . Hyperlipidemia   . Osteoporosis   . Pneumonia   .  Stroke ([place])   . Type II or unspecified type diabetes mellitus without mention of complication, uncontrolled     Family History  Problem Relation Age of Onset  . Diabetes Mother   . Hypertension Mother   . Stroke Father   . Stroke Maternal Uncle   . Stroke Paternal Uncle   . Colon cancer Neg Hx   . Esophageal cancer Neg Hx   . Rectal cancer Neg Hx   . Stomach cancer Neg Hx     Past Surgical History:  Procedure Laterality Date  . ABDOMINAL HYSTERECTOMY    . CARPAL TUNNEL RELEASE     right  . COLONOSCOPY  12-10-10   per Dr. Deatra Ina, clear, repeat in 7 yrs   . INCISION AND DRAINAGE PERIRECTAL ABSCESS N/A 09/26/2015   Procedure: IRRIGATION AND DEBRIDEMENT  PERIRECTAL ABSCESS;  Surgeon: Mickeal Skinner, MD;  Location: [facility];  Service: General;  Laterality: N/A;  . KNEE ARTHROSCOPY     right  . LOOP RECORDER INSERTION N/A 10/19/2016   Procedure: LOOP RECORDER INSERTION;  Surgeon: Thompson Grayer, MD;  Location: [facility];  Service: Cardiovascular;  Laterality: N/A;  . ORIF ANKLE FRACTURE Right 03/24/2012   Procedure: OPEN REDUCTION INTERNAL FIXATION (ORIF) ANKLE FRACTURE;  Surgeon: Newt Minion, MD;  Location: [facility];  Service: Orthopedics;  Laterality: Right;  Open Reduction Internal Fixation Right Bimalleolar ankle fracture  . POLYPECTOMY    . TEE WITHOUT CARDIOVERSION N/A 10/18/2016   Procedure: TRANSESOPHAGEAL ECHOCARDIOGRAM (TEE);  Surgeon: Fay Records, MD;  Location: [facility]COPY;  Service: Cardiovascular;  Laterality: N/A;  . TONSILLECTOMY     Social History   Occupational History  . Not on file  Tobacco Use  . Smoking status: Former Smoker    Types: Cigarettes    Last attempt to quit: 10/15/2016    Years since quitting: 1.1  . Smokeless tobacco: Never Used  . Tobacco comment: smokes occ.   Substance and Sexual Activity  . Alcohol use: Yes    Alcohol/week: 0.0 standard drinks    Comment: occ  . Drug use: No  . Sexual activity: Not on file

## 2017-11-29 ENCOUNTER — Ambulatory Visit: Payer: Medicare Other | Admitting: Family Medicine

## 2017-11-30 LAB — CUP PACEART REMOTE DEVICE CHECK
Date Time Interrogation Session: 20191029013924
Implantable Pulse Generator Implant Date: 20181009

## 2017-12-05 ENCOUNTER — Ambulatory Visit: Payer: Medicare Other | Admitting: Family Medicine

## 2017-12-12 ENCOUNTER — Ambulatory Visit (INDEPENDENT_AMBULATORY_CARE_PROVIDER_SITE_OTHER): Payer: Medicare Other

## 2017-12-12 DIAGNOSIS — I639 Cerebral infarction, unspecified: Secondary | ICD-10-CM

## 2017-12-12 NOTE — Progress Notes (Signed)
[facility] Summary Report / Loop Recorder 

## 2017-12-19 ENCOUNTER — Ambulatory Visit (INDEPENDENT_AMBULATORY_CARE_PROVIDER_SITE_OTHER): Payer: Medicare Other | Admitting: Family Medicine

## 2017-12-19 ENCOUNTER — Encounter: Payer: Self-pay | Admitting: Family Medicine

## 2017-12-19 VITALS — BP 146/76 | HR 96 | Temp 98.3°F | Resp 16 | Wt 140.6 lb

## 2017-12-19 DIAGNOSIS — Z23 Encounter for immunization: Secondary | ICD-10-CM | POA: Diagnosis not present

## 2017-12-19 DIAGNOSIS — E559 Vitamin D deficiency, unspecified: Secondary | ICD-10-CM | POA: Diagnosis not present

## 2017-12-19 DIAGNOSIS — E785 Hyperlipidemia, unspecified: Secondary | ICD-10-CM

## 2017-12-19 DIAGNOSIS — I119 Hypertensive heart disease without heart failure: Secondary | ICD-10-CM | POA: Diagnosis not present

## 2017-12-19 DIAGNOSIS — E1121 Type 2 diabetes mellitus with diabetic nephropathy: Secondary | ICD-10-CM | POA: Diagnosis not present

## 2017-12-19 LAB — LIPID PANEL
Cholesterol: 160 mg/dL (ref 0–200)
HDL: 83.7 mg/dL (ref >39.00)
LDL Cholesterol: 60 mg/dL (ref 0–99)
NonHDL: 76.2
Total CHOL/HDL Ratio: 2
Triglycerides: 80 mg/dL (ref 0.0–149.0)
VLDL: 16 mg/dL (ref 0.0–40.0)

## 2017-12-19 LAB — MICROALBUMIN / CREATININE URINE RATIO
Creatinine,U: 110.8 mg/dL
Microalb Creat Ratio: 90.8 mg/g — ABNORMAL HIGH (ref 0.0–30.0)
Microalb, Ur: 100.6 mg/dL — ABNORMAL HIGH (ref 0.0–1.9)

## 2017-12-19 LAB — VITAMIN D 25 HYDROXY (VIT D DEFICIENCY, FRACTURES): VITD: 20.16 ng/mL — ABNORMAL LOW (ref 30.00–100.00)

## 2017-12-19 MED ORDER — LIRAGLUTIDE 18 MG/3ML ~~LOC~~ SOPN: PEN_INJECTOR | SUBCUTANEOUS | 3 refills | Status: DC

## 2017-12-19 MED ORDER — LISINOPRIL 10 MG PO TABS: 10.0000 mg | ORAL_TABLET | Freq: Every day | ORAL | 2 refills | Status: DC

## 2017-12-19 MED ORDER — INSULIN GLARGINE 100 UNIT/ML SOLOSTAR PEN: 60.0000 [IU] | PEN_INJECTOR | Freq: Every day | SUBCUTANEOUS | 3 refills | Status: DC

## 2017-12-19 NOTE — Assessment & Plan Note (Signed)
Problem has been poorly controlled for a while. She is not compliant with dietary recommendations and treatment, the latter due to financial issues. Today Victoza was increased from 1.5 mg to 1.8 mg daily. For now no changes in Lantus, she will continue 60 units daily. We discussed possible complications of DM 2. She is overdue for eye exam, recommend arranging appointment. I will consider referral to endocrinologist if A1c is not better. Follow-up in 4 months.

## 2017-12-19 NOTE — Assessment & Plan Note (Signed)
BP mildly elevated. Lisinopril increased from 5 mg to 10 mg. No changes in rest of antihypertensive medications. She is due for eye exam. Low-salt diet recommended. Instructed to monitor BP at home. Follow-up in 4 months.

## 2017-12-19 NOTE — Patient Instructions (Addendum)
A few things to remember from today's visit:   Type II diabetes mellitus with nephropathy ([place]) - Plan: Microalbumin / creatinine urine ratio  Hypertension with heart disease  Vitamin D deficiency - Plan: VITAMIN D 25 Hydroxy (Vit-D Deficiency, Fractures), Parathyroid hormone, intact (no Ca)  Uncontrolled type 2 diabetes mellitus with insulin therapy ([place]) - Plan: Insulin Glargine (LANTUS) 100 UNIT/ML Solostar Pen  Hyperlipidemia, unspecified hyperlipidemia type - Plan: Lipid panel  Increase Victoza from 1.5 to 1.8. Lantus no changes. Lisinopril increased from 5 mg to 10 mg.   Please be sure medication list is accurate. If a new problem present, please set up appointment sooner than planned today.

## 2017-12-19 NOTE — Assessment & Plan Note (Signed)
No changes in Lipitor 40 mg daily. Recommend low-fat diet. Further recommendation will be given according to lipid panel results.

## 2017-12-19 NOTE — Progress Notes (Signed)
HPI:   Sarah [name] is a 72 y.o. female, who is here today for 4 months follow up.   She was last seen on 09/26/17 for acute visit.   Hypertension:  S/P CVA with residual slurred speech.  Currently on Lisinopril 10 mg daily and Amlodipine 5 mg daily.  She is not checking BP's. She is taking medications as instructed, no side effects reported.  She has not noted unusual headache, visual changes, exertional chest pain, dyspnea,  focal weakness, or edema.   Lab Results  Component Value Date   CREATININE 0.68 07/29/2017   BUN 12 07/29/2017   NA 136 07/29/2017   K 4.3 07/29/2017   CL 101 07/29/2017   CO2 29 07/29/2017     Diabetes Mellitus II:  Problem has been poorly controlled. Currently on Victoza 1.5 mg daily and lantus 60 U daily.  Trulicity was not covered by her insurance.  She is not taking Humalog. Checking BS's : "up and down", upper 100's and 200's,she has not have 300's in over a month.  Hypoglycemia:Denies. Last eye exam 13 months. She is hoping to be able to have cataract sugery but has not been able to do so because poorly controled DM II.  She is tolerating medications well. Denies abdominal pain, nausea, vomiting, polydipsia, polyuria, or polyphagia.  "Eating all the time" She has not being compliance with dietary recommendations and has been eating more carbs and sweets due to holidays.   Lab Results  Component Value Date   HGBA1C 14.4 (H) 07/29/2017   Lab Results  Component Value Date   MICROALBUR 112.5 (H) 07/29/2017    HLD: She is on Lipitor 40 mg daily. Tolerating medication well. She has not been following a low fat diet.  Lab Results  Component Value Date   CHOL 156 10/16/2016   HDL 37 (L) 10/16/2016   LDLCALC 98 10/16/2016   TRIG 104 10/16/2016   CHOLHDL 4.2 10/16/2016   Vit D deficiency: She is on Ergocalciferol 50,000 U 2 times per week.   Review of Systems  Constitutional: Positive for fatigue. Negative  for activity change, appetite change and fever.  HENT: Negative for mouth sores, nosebleeds and trouble swallowing.   Eyes: Negative for redness and visual disturbance.  Respiratory: Negative for cough, shortness of breath and wheezing.   Cardiovascular: Negative for chest pain, palpitations and leg swelling.  Gastrointestinal: Negative for abdominal pain, nausea and vomiting.       Negative for changes in bowel habits.  Endocrine: Negative for polydipsia and polyphagia.  Genitourinary: Negative for decreased urine volume, dysuria and hematuria.  Musculoskeletal: Negative for gait problem and myalgias.  Skin: Negative for color change and rash.  Neurological: Negative for seizures, syncope, weakness, numbness and headaches.     Current Outpatient Medications on File Prior to Visit  Medication Sig Dispense Refill  . amLODipine (NORVASC) 5 MG tablet Take 1 tablet (5 mg total) by mouth daily. 90 tablet 1  . atorvastatin (LIPITOR) 40 MG tablet TAKE 1 TABLET(40 MG) BY MOUTH DAILY 90 tablet 0  . Blood Glucose Calibration (OT ULTRA/FASTTK CNTRL SOLN) SOLN     . clopidogrel (PLAVIX) 75 MG tablet TAKE 1 TABLET BY MOUTH DAILY 90 tablet 2  . insulin lispro (HUMALOG) 100 UNIT/ML cartridge Inject 0.05-0.1 mLs (5-10 Units total) into the skin 3 (three) times daily with meals. 15 mL 6  . Insulin Pen Needle (BD PEN NEEDLE NANO U/F) 32G X 4 MM MISC USE  UP TO FOUR TIMES DAILY 200 each 2  . Lancet Devices (SIMPLE DIAGNOSTICS LANCING DEV) MISC Check BS before and 2 hours after meals. 200 each 6  . NON FORMULARY     . ONE TOUCH ULTRA TEST test strip Check BS before and 2 hours after meals. 200 each 6  . PHARMACIST CHOICE ALCOHOL 70 % PADS     . TRAVATAN Z 0.004 % SOLN ophthalmic solution Place 1 drop into both eyes at bedtime.  11  . Vitamin D, Ergocalciferol, (DRISDOL) 50000 units CAPS capsule TAKE ONE CAPSULE BY MOUTH TWICE A WEEK ON MONDAY AND THURSDAY 25 capsule 2   No current facility-administered  medications on file prior to visit.      Past Medical History:  Diagnosis Date  . Boils   . Glaucoma   . Heart murmur   . HTN (hypertension)   . Hx of adenomatous colonic polyps   . Hyperlipidemia   . Osteoporosis   . Pneumonia   . Stroke ([place])   . Type II or unspecified type diabetes mellitus without mention of complication, uncontrolled    No Known Allergies  Social History   Socioeconomic History  . Marital status: Single    Spouse name: Not on file  . Number of children: Not on file  . Years of education: Not on file  . Highest education level: Not on file  Occupational History  . Not on file  Social Needs  . Financial resource strain: Not on file  . Food insecurity:    Worry: Not on file    Inability: Not on file  . Transportation needs:    Medical: Not on file    Non-medical: Not on file  Tobacco Use  . Smoking status: Former Smoker    Types: Cigarettes    Last attempt to quit: 10/15/2016    Years since quitting: 1.1  . Smokeless tobacco: Never Used  . Tobacco comment: smokes occ.   Substance and Sexual Activity  . Alcohol use: Yes    Alcohol/week: 0.0 standard drinks    Comment: occ  . Drug use: No  . Sexual activity: Not on file  Lifestyle  . Physical activity:    Days per week: Not on file    Minutes per session: Not on file  . Stress: Not on file  Relationships  . Social connections:    Talks on phone: Not on file    Gets together: Not on file    Attends religious service: Not on file    Active member of club or organization: Not on file    Attends meetings of clubs or organizations: Not on file    Relationship status: Not on file  Other Topics Concern  . Not on file  Social History Narrative  . Not on file    Vitals:   12/19/17 0945  BP: (!) 146/76  Pulse: 96  Resp: 16  Temp: 98.3 F (36.8 C)  SpO2: 97%   Body mass index is 27.46 kg/m.   Physical Exam  Nursing note and vitals reviewed. Constitutional: She is oriented to  person, place, and time. She appears well-developed. No distress.  HENT:  Head: Normocephalic and atraumatic.  Mouth/Throat: Oropharynx is clear and moist and mucous membranes are normal.  Eyes: Pupils are equal, round, and reactive to light. Conjunctivae are normal.  Cardiovascular: Normal rate and regular rhythm.  No murmur heard. Pulses:      Dorsalis pedis pulses are 2+ on the right side,  and 2+ on the left side.  Respiratory: Effort normal and breath sounds normal. No respiratory distress.  GI: Soft. She exhibits no mass. There is no hepatomegaly. There is no tenderness.  Musculoskeletal: She exhibits no edema.  Lymphadenopathy:    She has no cervical adenopathy.  Neurological: She is alert and oriented to person, place, and time. She has normal strength. No cranial nerve deficit. Gait normal.  Skin: Skin is warm. No rash noted. No erythema.  Psychiatric: She has a normal mood and affect.  Well groomed, good eye contact.     ASSESSMENT AND PLAN:   Sarah [name] was seen today for 4 months follow-up.  Orders Placed This Encounter  Procedures  . Flu vaccine HIGH DOSE PF  . Microalbumin / creatinine urine ratio  . VITAMIN D 25 Hydroxy (Vit-D Deficiency, Fractures)  . Parathyroid hormone, intact (no Ca)  . Lipid panel   Lab Results  Component Value Date   CHOL 160 12/19/2017   HDL 83.70 12/19/2017   LDLCALC 60 12/19/2017   TRIG 80.0 12/19/2017   CHOLHDL 2 12/19/2017   Lab Results  Component Value Date   MICROALBUR 100.6 (H) 12/19/2017   Lab Results  Component Value Date   CHOL 160 12/19/2017   HDL 83.70 12/19/2017   LDLCALC 60 12/19/2017   TRIG 80.0 12/19/2017   CHOLHDL 2 12/19/2017     Type II diabetes mellitus with nephropathy ([place]) Problem has been poorly controlled for a while. She is not compliant with dietary recommendations and treatment, the latter due to financial issues. Today Victoza was increased from 1.5 mg to 1.8 mg daily. For now  no changes in Lantus, she will continue 60 units daily. We discussed possible complications of DM 2. She is overdue for eye exam, recommend arranging appointment. I will consider referral to endocrinologist if A1c is not better. Follow-up in 4 months.  Hypertension with heart disease BP mildly elevated. Lisinopril increased from 5 mg to 10 mg. No changes in rest of antihypertensive medications. She is due for eye exam. Low-salt diet recommended. Instructed to monitor BP at home. Follow-up in 4 months.  Vitamin D deficiency No changes in current management, will follow labs done today and will give further recommendations accordingly.   Hyperlipidemia No changes in Lipitor 40 mg daily. Recommend low-fat diet. Further recommendation will be given according to lipid panel results.     [name]. [name], MD  [facility]. [place] office.

## 2017-12-19 NOTE — Assessment & Plan Note (Signed)
No changes in current management, will follow labs done today and will give further recommendations accordingly.  

## 2017-12-20 LAB — PARATHYROID HORMONE, INTACT (NO CA): PTH: 52 pg/mL (ref 14–64)

## 2017-12-22 ENCOUNTER — Other Ambulatory Visit: Payer: Self-pay | Admitting: Neurology

## 2017-12-22 ENCOUNTER — Other Ambulatory Visit: Payer: Self-pay | Admitting: Family Medicine

## 2017-12-28 ENCOUNTER — Other Ambulatory Visit: Payer: Self-pay | Admitting: Family Medicine

## 2017-12-28 ENCOUNTER — Other Ambulatory Visit (INDEPENDENT_AMBULATORY_CARE_PROVIDER_SITE_OTHER): Payer: Medicare Other

## 2017-12-28 DIAGNOSIS — E1121 Type 2 diabetes mellitus with diabetic nephropathy: Secondary | ICD-10-CM | POA: Diagnosis not present

## 2017-12-28 LAB — HEMOGLOBIN A1C: Hgb A1c MFr Bld: 13.5 % — ABNORMAL HIGH (ref 4.6–6.5)

## 2018-01-02 ENCOUNTER — Other Ambulatory Visit: Payer: Self-pay | Admitting: *Deleted

## 2018-01-02 DIAGNOSIS — E1121 Type 2 diabetes mellitus with diabetic nephropathy: Secondary | ICD-10-CM

## 2018-01-12 ENCOUNTER — Ambulatory Visit (INDEPENDENT_AMBULATORY_CARE_PROVIDER_SITE_OTHER): Payer: Medicare Other

## 2018-01-12 DIAGNOSIS — I639 Cerebral infarction, unspecified: Secondary | ICD-10-CM | POA: Diagnosis not present

## 2018-01-13 LAB — CUP PACEART REMOTE DEVICE CHECK
Date Time Interrogation Session: 20200103051129
Implantable Pulse Generator Implant Date: 20181009

## 2018-01-13 NOTE — Progress Notes (Signed)
[facility] Summary Report / Loop Recorder 

## 2018-01-15 ENCOUNTER — Encounter: Payer: Self-pay | Admitting: Gastroenterology

## 2018-01-19 ENCOUNTER — Encounter: Payer: Self-pay | Admitting: Gastroenterology

## 2018-01-21 ENCOUNTER — Other Ambulatory Visit: Payer: Self-pay | Admitting: Neurology

## 2018-01-24 ENCOUNTER — Encounter: Payer: Self-pay | Admitting: Family Medicine

## 2018-01-24 DIAGNOSIS — R921 Mammographic calcification found on diagnostic imaging of breast: Secondary | ICD-10-CM | POA: Diagnosis not present

## 2018-01-26 ENCOUNTER — Ambulatory Visit: Payer: Medicare Other | Admitting: Registered"

## 2018-02-06 ENCOUNTER — Telehealth: Payer: Self-pay | Admitting: *Deleted

## 2018-02-06 NOTE — Telephone Encounter (Signed)
Patient stopped by the office to request an appt.  We scheduled her for this Thursday but she is very concerned and wants to speak to someone today please.  States she is having vision problems more on the left than the right.  Please call asap

## 2018-02-06 NOTE — Telephone Encounter (Signed)
Called patient who stated she "cannot see well, is walking into things". She stated she thought it was from a cold, and her eyesight is not worse but no better. She has an appointment at [facility]. I advised she call me back tomorrow after she sees eye dr. I will leave FU in place for Thurs until I hear from her tomorrow. She verbalized understanding, appreciation.

## 2018-02-08 ENCOUNTER — Inpatient Hospital Stay (HOSPITAL_COMMUNITY)
Admission: EM | Admit: 2018-02-08 | Discharge: 2018-02-10 | DRG: 065 | Disposition: A | Discharge status: Home / Self Care | Payer: Medicare Other | Attending: Internal Medicine | Admitting: Internal Medicine

## 2018-02-08 ENCOUNTER — Telehealth: Payer: Self-pay | Admitting: Physical Medicine & Rehabilitation

## 2018-02-08 ENCOUNTER — Encounter (HOSPITAL_COMMUNITY): Payer: Self-pay

## 2018-02-08 ENCOUNTER — Inpatient Hospital Stay (HOSPITAL_COMMUNITY): Payer: Medicare Other

## 2018-02-08 ENCOUNTER — Other Ambulatory Visit: Payer: Self-pay

## 2018-02-08 DIAGNOSIS — Z79899 Other long term (current) drug therapy: Secondary | ICD-10-CM

## 2018-02-08 DIAGNOSIS — Z90710 Acquired absence of both cervix and uterus: Secondary | ICD-10-CM

## 2018-02-08 DIAGNOSIS — R29702 NIHSS score 2: Secondary | ICD-10-CM | POA: Diagnosis not present

## 2018-02-08 DIAGNOSIS — Z87891 Personal history of nicotine dependence: Secondary | ICD-10-CM | POA: Diagnosis not present

## 2018-02-08 DIAGNOSIS — Z8249 Family history of ischemic heart disease and other diseases of the circulatory system: Secondary | ICD-10-CM | POA: Diagnosis not present

## 2018-02-08 DIAGNOSIS — I110 Hypertensive heart disease with heart failure: Secondary | ICD-10-CM | POA: Diagnosis present

## 2018-02-08 DIAGNOSIS — R531 Weakness: Secondary | ICD-10-CM | POA: Diagnosis present

## 2018-02-08 DIAGNOSIS — I639 Cerebral infarction, unspecified: Secondary | ICD-10-CM | POA: Diagnosis not present

## 2018-02-08 DIAGNOSIS — E119 Type 2 diabetes mellitus without complications: Secondary | ICD-10-CM | POA: Diagnosis present

## 2018-02-08 DIAGNOSIS — E1121 Type 2 diabetes mellitus with diabetic nephropathy: Secondary | ICD-10-CM | POA: Diagnosis not present

## 2018-02-08 DIAGNOSIS — Z8673 Personal history of transient ischemic attack (TIA), and cerebral infarction without residual deficits: Secondary | ICD-10-CM

## 2018-02-08 DIAGNOSIS — I10 Essential (primary) hypertension: Secondary | ICD-10-CM | POA: Diagnosis present

## 2018-02-08 DIAGNOSIS — M810 Age-related osteoporosis without current pathological fracture: Secondary | ICD-10-CM | POA: Diagnosis not present

## 2018-02-08 DIAGNOSIS — E1151 Type 2 diabetes mellitus with diabetic peripheral angiopathy without gangrene: Secondary | ICD-10-CM | POA: Diagnosis not present

## 2018-02-08 DIAGNOSIS — E785 Hyperlipidemia, unspecified: Secondary | ICD-10-CM | POA: Diagnosis not present

## 2018-02-08 DIAGNOSIS — Z794 Long term (current) use of insulin: Secondary | ICD-10-CM | POA: Diagnosis not present

## 2018-02-08 DIAGNOSIS — H539 Unspecified visual disturbance: Secondary | ICD-10-CM

## 2018-02-08 DIAGNOSIS — I63411 Cerebral infarction due to embolism of right middle cerebral artery: Secondary | ICD-10-CM | POA: Diagnosis not present

## 2018-02-08 DIAGNOSIS — I6340 Cerebral infarction due to embolism of unspecified cerebral artery: Secondary | ICD-10-CM | POA: Insufficient documentation

## 2018-02-08 DIAGNOSIS — I5032 Chronic diastolic (congestive) heart failure: Secondary | ICD-10-CM | POA: Diagnosis present

## 2018-02-08 DIAGNOSIS — H53462 Homonymous bilateral field defects, left side: Secondary | ICD-10-CM | POA: Diagnosis present

## 2018-02-08 DIAGNOSIS — E782 Mixed hyperlipidemia: Secondary | ICD-10-CM | POA: Diagnosis present

## 2018-02-08 DIAGNOSIS — Z823 Family history of stroke: Secondary | ICD-10-CM | POA: Diagnosis not present

## 2018-02-08 DIAGNOSIS — H401111 Primary open-angle glaucoma, right eye, mild stage: Secondary | ICD-10-CM | POA: Diagnosis not present

## 2018-02-08 DIAGNOSIS — H547 Unspecified visual loss: Secondary | ICD-10-CM | POA: Diagnosis not present

## 2018-02-08 DIAGNOSIS — I5189 Other ill-defined heart diseases: Secondary | ICD-10-CM | POA: Diagnosis not present

## 2018-02-08 DIAGNOSIS — Z833 Family history of diabetes mellitus: Secondary | ICD-10-CM | POA: Diagnosis not present

## 2018-02-08 DIAGNOSIS — I63513 Cerebral infarction due to unspecified occlusion or stenosis of bilateral middle cerebral arteries: Secondary | ICD-10-CM | POA: Diagnosis not present

## 2018-02-08 DIAGNOSIS — Z7902 Long term (current) use of antithrombotics/antiplatelets: Secondary | ICD-10-CM | POA: Diagnosis not present

## 2018-02-08 DIAGNOSIS — H25812 Combined forms of age-related cataract, left eye: Secondary | ICD-10-CM | POA: Diagnosis not present

## 2018-02-08 DIAGNOSIS — I6782 Cerebral ischemia: Secondary | ICD-10-CM | POA: Diagnosis not present

## 2018-02-08 DIAGNOSIS — I159 Secondary hypertension, unspecified: Secondary | ICD-10-CM | POA: Diagnosis not present

## 2018-02-08 LAB — COMPREHENSIVE METABOLIC PANEL
ALT: 10 U/L (ref 0–44)
AST: 14 U/L — ABNORMAL LOW (ref 15–41)
Albumin: 2.8 g/dL — ABNORMAL LOW (ref 3.5–5.0)
Alkaline Phosphatase: 74 U/L (ref 38–126)
Anion gap: 9 (ref 5–15)
BUN: 14 mg/dL (ref 8–23)
CO2: 23 mmol/L (ref 22–32)
Calcium: 9.2 mg/dL (ref 8.9–10.3)
Chloride: 107 mmol/L (ref 98–111)
Creatinine, Ser: 0.54 mg/dL (ref 0.44–1.00)
GFR calc Af Amer: >60 mL/min (ref >60)
GFR calc non Af Amer: >60 mL/min (ref >60)
Glucose, Bld: 263 mg/dL — ABNORMAL HIGH (ref 70–99)
Potassium: 3.6 mmol/L (ref 3.5–5.1)
Sodium: 139 mmol/L (ref 135–145)
Total Bilirubin: 0.4 mg/dL (ref 0.3–1.2)
Total Protein: 6.5 g/dL (ref 6.5–8.1)

## 2018-02-08 LAB — URINALYSIS, ROUTINE W REFLEX MICROSCOPIC
Bilirubin Urine: NEGATIVE
Glucose, UA: >=500 mg/dL — AB
Ketones, ur: NEGATIVE mg/dL
Nitrite: NEGATIVE
Protein, ur: >=300 mg/dL — AB
Specific Gravity, Urine: 1.023 (ref 1.005–1.030)
pH: 5 (ref 5.0–8.0)

## 2018-02-08 LAB — CBC WITH DIFFERENTIAL/PLATELET
Abs Immature Granulocytes: 0.06 10*3/uL (ref 0.00–0.07)
Basophils Absolute: 0 10*3/uL (ref 0.0–0.1)
Basophils Relative: 1 %
Eosinophils Absolute: 0.1 10*3/uL (ref 0.0–0.5)
Eosinophils Relative: 1 %
HCT: 37.7 % (ref 36.0–46.0)
Hemoglobin: 12.5 g/dL (ref 12.0–15.0)
Immature Granulocytes: 1 %
Lymphocytes Relative: 27 %
Lymphs Abs: 2 10*3/uL (ref 0.7–4.0)
MCH: 30.6 pg (ref 26.0–34.0)
MCHC: 33.2 g/dL (ref 30.0–36.0)
MCV: 92.4 fL (ref 80.0–100.0)
Monocytes Absolute: 0.4 10*3/uL (ref 0.1–1.0)
Monocytes Relative: 6 %
Neutro Abs: 4.8 10*3/uL (ref 1.7–7.7)
Neutrophils Relative %: 64 %
Platelets: 323 10*3/uL (ref 150–400)
RBC: 4.08 MIL/uL (ref 3.87–5.11)
RDW: 11.9 % (ref 11.5–15.5)
WBC: 7.4 10*3/uL (ref 4.0–10.5)
nRBC: 0 % (ref 0.0–0.2)

## 2018-02-08 LAB — CBG MONITORING, ED: Glucose-Capillary: 248 mg/dL — ABNORMAL HIGH (ref 70–99)

## 2018-02-08 LAB — GLUCOSE, CAPILLARY: Glucose-Capillary: 238 mg/dL — ABNORMAL HIGH (ref 70–99)

## 2018-02-08 MED ORDER — INSULIN GLARGINE 100 UNIT/ML ~~LOC~~ SOLN
60.0000 [IU] | Freq: Every day | SUBCUTANEOUS | Status: DC
  Administered 2018-02-09 – 2018-02-10 (×2): 60 [IU] via SUBCUTANEOUS
  Filled 2018-02-08 (×2): qty 0.6

## 2018-02-08 MED ORDER — LATANOPROST 0.005 % OP SOLN
1.0000 [drp] | Freq: Every day | OPHTHALMIC | Status: DC
  Administered 2018-02-09: 1 [drp] via OPHTHALMIC
  Filled 2018-02-08: qty 2.5

## 2018-02-08 MED ORDER — INSULIN ASPART 100 UNIT/ML ~~LOC~~ SOLN
0.0000 [IU] | Freq: Three times a day (TID) | SUBCUTANEOUS | Status: DC
  Administered 2018-02-08 – 2018-02-09 (×2): 5 [IU] via SUBCUTANEOUS
  Administered 2018-02-09 – 2018-02-10 (×2): 3 [IU] via SUBCUTANEOUS

## 2018-02-08 MED ORDER — ACETAMINOPHEN 325 MG PO TABS
650.0000 mg | ORAL_TABLET | ORAL | Status: DC | PRN
  Administered 2018-02-09 (×2): 650 mg via ORAL
  Filled 2018-02-08 (×2): qty 2

## 2018-02-08 MED ORDER — ATORVASTATIN CALCIUM 80 MG PO TABS
80.0000 mg | ORAL_TABLET | Freq: Every day | ORAL | Status: DC
  Administered 2018-02-09: 80 mg via ORAL
  Filled 2018-02-08: qty 1

## 2018-02-08 MED ORDER — NETARSUDIL-LATANOPROST 0.02-0.005 % OP SOLN
1.0000 [drp] | Freq: Every evening | OPHTHALMIC | Status: DC
  Administered 2018-02-08 – 2018-02-09 (×2): 1 [drp] via OPHTHALMIC

## 2018-02-08 MED ORDER — STROKE: EARLY STAGES OF RECOVERY BOOK: Freq: Once | Status: DC

## 2018-02-08 MED ORDER — SENNOSIDES-DOCUSATE SODIUM 8.6-50 MG PO TABS: 1.0000 | ORAL_TABLET | Freq: Every evening | ORAL | Status: DC | PRN

## 2018-02-08 MED ORDER — ENOXAPARIN SODIUM 40 MG/0.4ML ~~LOC~~ SOLN
40.0000 mg | SUBCUTANEOUS | Status: DC
  Administered 2018-02-09 – 2018-02-10 (×2): 40 mg via SUBCUTANEOUS
  Filled 2018-02-08 (×2): qty 0.4

## 2018-02-08 MED ORDER — ACETAMINOPHEN 650 MG RE SUPP: 650.0000 mg | RECTAL | Status: DC | PRN

## 2018-02-08 MED ORDER — GADOBUTROL 1 MMOL/ML IV SOLN
7.0000 mL | Freq: Once | INTRAVENOUS | Status: AC | PRN
  Administered 2018-02-08: 7 mL via INTRAVENOUS

## 2018-02-08 MED ORDER — ACETAMINOPHEN 325 MG PO TABS
650.0000 mg | ORAL_TABLET | Freq: Once | ORAL | Status: AC
  Administered 2018-02-08: 650 mg via ORAL
  Filled 2018-02-08: qty 2

## 2018-02-08 MED ORDER — SODIUM CHLORIDE 0.9 % IV SOLN
INTRAVENOUS | Status: DC
  Administered 2018-02-08 – 2018-02-09 (×2): via INTRAVENOUS

## 2018-02-08 MED ORDER — INSULIN ASPART 100 UNIT/ML ~~LOC~~ SOLN
0.0000 [IU] | Freq: Every day | SUBCUTANEOUS | Status: DC
  Administered 2018-02-08: 2 [IU] via SUBCUTANEOUS
  Administered 2018-02-09: 3 [IU] via SUBCUTANEOUS

## 2018-02-08 MED ORDER — ACETAMINOPHEN 160 MG/5ML PO SOLN: 650.0000 mg | ORAL | Status: DC | PRN

## 2018-02-08 MED ORDER — ASPIRIN 300 MG RE SUPP
300.0000 mg | Freq: Every day | RECTAL | Status: DC
  Filled 2018-02-08 (×2): qty 1

## 2018-02-08 MED ORDER — ASPIRIN 325 MG PO TABS
325.0000 mg | ORAL_TABLET | Freq: Every day | ORAL | Status: DC
  Administered 2018-02-09 – 2018-02-10 (×2): 325 mg via ORAL
  Filled 2018-02-08 (×2): qty 1

## 2018-02-08 NOTE — ED Notes (Signed)
Called CT to check on pt placement in que. CT says there are about 5 pt's in front of her.

## 2018-02-08 NOTE — ED Triage Notes (Signed)
Pt began having blurry vision 3 days ago and walking into things. She saw eye doctor today and was sent here for evaluation. Pt reports taking mucinex D and having a headache x3 days. Pt reports taking meds except for today.

## 2018-02-08 NOTE — Progress Notes (Signed)
Please advise. MRA neck has been completed and demonstrates patent carotid system.  Contact vascular lab if carotid duplex still needed.   [place], BS, RDMS, RVT

## 2018-02-08 NOTE — ED Provider Notes (Signed)
[facility] EMERGENCY DEPARTMENT Provider Note   CSN: [id number] Arrival date & time: 02/08/18  1109     History   Chief Complaint Chief Complaint  Patient presents with  . Blurred Vision    HPI Sarah [name] is a 73 y.o. female.  73 year old female with prior medical history as detailed below presents for evaluation of visual field loss.  Patient reports that 3 days ago she noticed that her vision in the left was decreased.  She noticed this because she would run into things.  She attempted to see her neurologist at [facility] neurology.  She was told to see ophthalmology.  She arrives today from Dr. Trena Platt (Opth[facility]) office for evaluation of left homonymous visual field loss.   Patient denies current current weakness, speech change, chest pain, shortness of breath, or other acute complaint.  The history is provided by the patient and medical records.  Illness  Location:  Visual field loss (left) Severity:  Moderate Onset quality:  Sudden Duration:  3 days Timing:  Constant Progression:  Unchanged Chronicity:  New   Past Medical History:  Diagnosis Date  . Boils   . Glaucoma   . Heart murmur   . HTN (hypertension)   . Hx of adenomatous colonic polyps   . Hyperlipidemia   . Osteoporosis   . Pneumonia   . Stroke ([place])   . Type II or unspecified type diabetes mellitus without mention of complication, uncontrolled     Patient Active Problem List   Diagnosis Date Noted  . Trigger finger, right index finger 10/17/2017  . Low back pain 07/29/2017  . Hemiparesis affecting left side as late effect of cerebrovascular accident (CVA) ([place]) 11/11/2016  . Gait disturbance, post-stroke 11/11/2016  . Dysarthria as late effect of stroke 11/11/2016  . Carotid artery stenosis 11/02/2016  . Right middle cerebral artery stroke ([place]) 10/19/2016  . Left arm weakness   . Diastolic dysfunction   . Hypertension with heart disease   . Tobacco abuse   . Acute  blood loss anemia   . Cerebral infarction due to embolism of right middle cerebral artery ([place])   . Stroke (cerebrum) ([place]) 10/15/2016  . Perirectal abscess 09/26/2015  . Vitamin D deficiency 07/23/2015  . Osteopenia 02/16/2013  . Ankle fracture 01/19/2013  . Anemia 09/27/2011  . Type II diabetes mellitus with nephropathy ([place]) 09/24/2011  . Hyperlipidemia 09/24/2011  . Hypertension 09/24/2011    Past Surgical History:  Procedure Laterality Date  . ABDOMINAL HYSTERECTOMY    . CARPAL TUNNEL RELEASE     right  . COLONOSCOPY  12-10-10   per Dr. Deatra Ina, clear, repeat in 7 yrs   . INCISION AND DRAINAGE PERIRECTAL ABSCESS N/A 09/26/2015   Procedure: IRRIGATION AND DEBRIDEMENT PERIRECTAL ABSCESS;  Surgeon: Mickeal Skinner, MD;  Location: [facility];  Service: General;  Laterality: N/A;  . KNEE ARTHROSCOPY     right  . LOOP RECORDER INSERTION N/A 10/19/2016   Procedure: LOOP RECORDER INSERTION;  Surgeon: Thompson Grayer, MD;  Location: [facility];  Service: Cardiovascular;  Laterality: N/A;  . ORIF ANKLE FRACTURE Right 03/24/2012   Procedure: OPEN REDUCTION INTERNAL FIXATION (ORIF) ANKLE FRACTURE;  Surgeon: Newt Minion, MD;  Location: [facility];  Service: Orthopedics;  Laterality: Right;  Open Reduction Internal Fixation Right Bimalleolar ankle fracture  . POLYPECTOMY    . TEE WITHOUT CARDIOVERSION N/A 10/18/2016   Procedure: TRANSESOPHAGEAL ECHOCARDIOGRAM (TEE);  Surgeon: Fay Records, MD;  Location: [facility];  Service: Cardiovascular;  Laterality: N/A;  . TONSILLECTOMY       OB History   No obstetric history on file.      Home Medications    Prior to Admission medications   Medication Sig Start Date End Date Taking? Authorizing Provider  amLODipine (NORVASC) 5 MG tablet TAKE 1 TABLET(5 MG) BY MOUTH DAILY 01/23/18   [name], [name], MD  atorvastatin (LIPITOR) 40 MG tablet TAKE 1 TABLET(40 MG) BY MOUTH DAILY 05/20/17   [name], [name], MD  Blood Glucose Calibration (OT  ULTRA/FASTTK CNTRL SOLN) SOLN  07/16/17   [provider]  clopidogrel (PLAVIX) 75 MG tablet TAKE 1 TABLET BY MOUTH DAILY 02/21/17   [name], [name], MD  Insulin Glargine (LANTUS) 100 UNIT/ML Solostar Pen Inject 60 Units into the skin daily. 12/19/17   [name], [name], MD  insulin lispro (HUMALOG) 100 UNIT/ML cartridge Inject 0.05-0.1 mLs (5-10 Units total) into the skin 3 (three) times daily with meals. 09/26/17   [name], [name], MD  Insulin Pen Needle (BD PEN NEEDLE NANO U/F) 32G X 4 MM MISC USE UP TO FOUR TIMES DAILY 09/26/17   [name], [name], MD  Lancet Devices (SIMPLE DIAGNOSTICS LANCING DEV) MISC Check BS before and 2 hours after meals. 09/26/17   [name], [name], MD  liraglutide (VICTOZA) 18 MG/3ML SOPN 1.8 mcg daily. 12/19/17   [name], [name], MD  lisinopril (PRINIVIL,ZESTRIL) 10 MG tablet Take 1 tablet (10 mg total) by mouth daily. 12/19/17   [name], [name], MD  NON FORMULARY  09/21/17   [provider]  ONE TOUCH ULTRA TEST test strip Check BS before and 2 hours after meals. 09/26/17   [name], [name], MD  PHARMACIST CHOICE ALCOHOL 70 % PADS  09/21/17   [provider]  TRAVATAN Z 0.004 % SOLN ophthalmic solution Place 1 drop into both eyes at bedtime. 10/07/16   [provider]  Vitamin D, Ergocalciferol, (DRISDOL) 1.25 MG (50000 UT) CAPS capsule TAKE ONE CAPSULE BY MOUTH TWICE A WEEK ON MONDAY AND THURSDAY 12/23/17   [name], [name], MD    Family History Family History  Problem Relation Age of Onset  . Diabetes Mother   . Hypertension Mother   . Stroke Father   . Stroke Maternal Uncle   . Stroke Paternal Uncle   . Colon cancer Neg Hx   . Esophageal cancer Neg Hx   . Rectal cancer Neg Hx   . Stomach cancer Neg Hx     Social History Social History   Tobacco Use  . Smoking status: Former Smoker    Types: Cigarettes    Last attempt to quit: 10/15/2016    Years since quitting: 1.3  . Smokeless tobacco: Never Used  . Tobacco comment: smokes occ.     Substance Use Topics  . Alcohol use: Yes    Alcohol/week: 0.0 standard drinks    Comment: occ  . Drug use: No     Allergies   Patient has no known allergies.   Review of Systems Review of Systems  All other systems reviewed and are negative.    Physical Exam Updated Vital Signs BP (!) 163/76   Pulse 78   Temp 97.8 F (36.6 C) (Oral)   Resp 20   Ht 5' (1.524 m)   Wt 67.6 kg   SpO2 97%   BMI 29.10 kg/m   Physical Exam Vitals signs and nursing note reviewed.  Constitutional:      General:  She is not in acute distress.    Appearance: She is well-developed.  HENT:     Head: Normocephalic and atraumatic.  Eyes:     Conjunctiva/sclera: Conjunctivae normal.     Pupils: Pupils are equal, round, and reactive to light.  Neck:     Musculoskeletal: Normal range of motion and neck supple.  Cardiovascular:     Rate and Rhythm: Normal rate and regular rhythm.     Heart sounds: Normal heart sounds.  Pulmonary:     Effort: Pulmonary effort is normal. No respiratory distress.     Breath sounds: Normal breath sounds.  Abdominal:     General: There is no distension.     Palpations: Abdomen is soft.     Tenderness: There is no abdominal tenderness.  Musculoskeletal: Normal range of motion.        General: No deformity.  Skin:    General: Skin is warm and dry.  Neurological:     General: No focal deficit present.     Mental Status: She is alert and oriented to person, place, and time. Mental status is at baseline.     Motor: No weakness.     Coordination: Coordination normal.     Comments: Left homonymous visual field loss  Normal speech  VAN negative  5/5 strength in BUE/BLE      ED Treatments / Results  Labs (all labs ordered are listed, but only abnormal results are displayed) Labs Reviewed  URINALYSIS, ROUTINE W REFLEX MICROSCOPIC - Abnormal; Notable for the following components:      Result Value   APPearance HAZY (*)    Glucose, UA >=500 (*)    Hgb  urine dipstick SMALL (*)    Protein, ur >=300 (*)    Leukocytes, UA MODERATE (*)    Bacteria, UA RARE (*)    All other components within normal limits  CBG MONITORING, ED - Abnormal; Notable for the following components:   Glucose-Capillary 248 (*)    All other components within normal limits  CBC WITH DIFFERENTIAL/PLATELET  COMPREHENSIVE METABOLIC PANEL    EKG EKG Interpretation  Date/Time:  Wednesday February 08 2018 11:33:20 EST Ventricular Rate:  79 PR Interval:    QRS Duration: 93 QT Interval:  369 QTC Calculation: 423 R Axis:   30 Text Interpretation:  Sinus rhythm Nonspecific T abnormalities, lateral leads Baseline wander in lead(s) V5 Confirmed by Dene Gentry [phone]) on 02/08/2018 11:36:03 AM   Radiology No results found.  Procedures Procedures (including critical care time)  Medications Ordered in ED Medications  acetaminophen (TYLENOL) tablet 650 mg (650 mg Oral Given 02/08/18 1233)     Initial Impression / Assessment and Plan / ED Course  I have reviewed the triage vital signs and the nursing notes.  Pertinent labs & imaging results that were available during my care of the patient were reviewed by me and considered in my medical decision making (see chart for details).     MDM  Screen complete  Patient is presenting for evaluation of left-sided visual field deficit.  Symptoms started approximately 3 days ago.  She has been seen by ophthalmology earlier today and was sent to the ED for evaluation for possible CVA.  Patient without other localizing symptoms on exam.  Patient denies other complaint.  Screening labs obtained are thus far without significant abnormality.  Patient's case is discussed with the neurology team - Dr. Alinda Deem. He agrees with plan of care and need to admit.   Dr. Lorin Mercy (  hospitalist) is aware of case and will evaluate for admission.   Final Clinical Impressions(s) / ED Diagnoses   Final diagnoses:  Visual disturbance    ED  Discharge Orders    None       Valarie Merino, MD 02/08/18 1515

## 2018-02-08 NOTE — Telephone Encounter (Signed)
LVM requesting patient call back to discuss her appointment with eye dr yesterday. Per Daun Peacock, NP if she needs to be seen she will need to see physician in this office. Therefore her FU tomorrow with NP will need to be changed to provider

## 2018-02-08 NOTE — ED Notes (Signed)
Contacted MRI to transport pt to room since report has been called.

## 2018-02-08 NOTE — ED Notes (Signed)
Patient transported to CT 

## 2018-02-08 NOTE — ED Notes (Signed)
Called MRI to let them know pt has a loop recorder. MRI will screen the type of device to determine if an MRI can be completed.

## 2018-02-08 NOTE — Progress Notes (Signed)
Patient arrived to 5W30 in NAD. VS stable. Patient free from pain.

## 2018-02-08 NOTE — H&P (Signed)
History and Physical    Sarah [name] YKD:[id number] DOB: 1945/11/20 DOA: 02/08/2018  PCP: [name], [name], MD Consultants:  Chipper Oman; [place] - rehab; Erlinda Hong - neurology Patient coming from:  Home - lives with mother; NOK: [place], [phone]  Chief Complaint: Visual field defect  HPI: Sarah [name] is a 73 y.o. female with medical history significant of CVA; DM: HLD; and HTN presenting with visual field defect.  3-4 days ago she started getting dizzy.  She had an eye doctor appointment already scheduled and so she went in.  The eye doctor examined her and was concerned for a stroke and sent her to [facility] for evaluation.  She noticed that she was walking into things and she couldn't see peripherally on either side.  She was getting too close to the line while driving and so she hasn't been driving.  Peripheral vision is decreased in both but worse on the left.  Also seeing blurriness.  +diplopia bilaterally.  No dysarthria but her throat feels hoarse.  No dysphagia.  No extremity symptoms.  She had a stroke in 10/17.  No residual deficits.   ED Course: h/o CVA with mild resolved left-sided weakness.  On Plavix.  3 days ago couldn't see to the left, sent to eye doctor - > sent to ER for left-sided visual field defect.  Concern for CVA 3 days ago.  CT/MRI ordered.  Review of Systems: As per HPI; otherwise review of systems reviewed and negative.   Ambulatory Status:  Ambulates without assistance  Past Medical History:  Diagnosis Date  . Boils   . Glaucoma   . Heart murmur   . HTN (hypertension)   . Hx of adenomatous colonic polyps   . Hyperlipidemia   . Osteoporosis   . Pneumonia   . Stroke ([place])   . Type II or unspecified type diabetes mellitus without mention of complication, uncontrolled     Past Surgical History:  Procedure Laterality Date  . ABDOMINAL HYSTERECTOMY    . CARPAL TUNNEL RELEASE     right  . COLONOSCOPY  12-10-10   per Dr. Deatra Ina,  clear, repeat in 7 yrs   . INCISION AND DRAINAGE PERIRECTAL ABSCESS N/A 09/26/2015   Procedure: IRRIGATION AND DEBRIDEMENT PERIRECTAL ABSCESS;  Surgeon: Mickeal Skinner, MD;  Location: [facility];  Service: General;  Laterality: N/A;  . KNEE ARTHROSCOPY     right  . LOOP RECORDER INSERTION N/A 10/19/2016   Procedure: LOOP RECORDER INSERTION;  Surgeon: Thompson Grayer, MD;  Location: [facility];  Service: Cardiovascular;  Laterality: N/A;  . ORIF ANKLE FRACTURE Right 03/24/2012   Procedure: OPEN REDUCTION INTERNAL FIXATION (ORIF) ANKLE FRACTURE;  Surgeon: Newt Minion, MD;  Location: [place];  Service: Orthopedics;  Laterality: Right;  Open Reduction Internal Fixation Right Bimalleolar ankle fracture  . POLYPECTOMY    . TEE WITHOUT CARDIOVERSION N/A 10/18/2016   Procedure: TRANSESOPHAGEAL ECHOCARDIOGRAM (TEE);  Surgeon: Fay Records, MD;  Location: [facility];  Service: Cardiovascular;  Laterality: N/A;  . TONSILLECTOMY      Social History   Socioeconomic History  . Marital status: Single    Spouse name: Not on file  . Number of children: Not on file  . Years of education: Not on file  . Highest education level: Not on file  Occupational History  . Not on file  Social Needs  . Financial resource strain: Not on file  . Food insecurity:    Worry: Not  on file    Inability: Not on file  . Transportation needs:    Medical: Not on file    Non-medical: Not on file  Tobacco Use  . Smoking status: Former Smoker    Types: Cigarettes    Last attempt to quit: 10/15/2016    Years since quitting: 1.3  . Smokeless tobacco: Never Used  . Tobacco comment: smokes occ.   Substance and Sexual Activity  . Alcohol use: Yes    Alcohol/week: 0.0 standard drinks    Comment: occ  . Drug use: No  . Sexual activity: Not on file  Lifestyle  . Physical activity:    Days per week: Not on file    Minutes per session: Not on file  . Stress: Not on file  Relationships  . Social connections:     Talks on phone: Not on file    Gets together: Not on file    Attends religious service: Not on file    Active member of club or organization: Not on file    Attends meetings of clubs or organizations: Not on file    Relationship status: Not on file  . Intimate partner violence:    Fear of current or ex partner: Not on file    Emotionally abused: Not on file    Physically abused: Not on file    Forced sexual activity: Not on file  Other Topics Concern  . Not on file  Social History Narrative  . Not on file    No Known Allergies  Family History  Problem Relation Age of Onset  . Diabetes Mother   . Hypertension Mother   . Stroke Father   . Stroke Maternal Uncle   . Stroke Paternal Uncle   . Colon cancer Neg Hx   . Esophageal cancer Neg Hx   . Rectal cancer Neg Hx   . Stomach cancer Neg Hx     Prior to Admission medications   Medication Sig Start Date End Date Taking? Authorizing Provider  amLODipine (NORVASC) 5 MG tablet TAKE 1 TABLET(5 MG) BY MOUTH DAILY 01/23/18   [name], [name], MD  atorvastatin (LIPITOR) 40 MG tablet TAKE 1 TABLET(40 MG) BY MOUTH DAILY 05/20/17   [name], [name], MD  Blood Glucose Calibration (OT ULTRA/FASTTK CNTRL SOLN) SOLN  07/16/17   [provider]  clopidogrel (PLAVIX) 75 MG tablet TAKE 1 TABLET BY MOUTH DAILY 02/21/17   [name], [name], MD  Insulin Glargine (LANTUS) 100 UNIT/ML Solostar Pen Inject 60 Units into the skin daily. 12/19/17   [name], [name], MD  insulin lispro (HUMALOG) 100 UNIT/ML cartridge Inject 0.05-0.1 mLs (5-10 Units total) into the skin 3 (three) times daily with meals. 09/26/17   [name], [name], MD  Insulin Pen Needle (BD PEN NEEDLE NANO U/F) 32G X 4 MM MISC USE UP TO FOUR TIMES DAILY 09/26/17   [name], [name], MD  Lancet Devices (SIMPLE DIAGNOSTICS LANCING DEV) MISC Check BS before and 2 hours after meals. 09/26/17   [name], [name], MD  liraglutide (VICTOZA) 18 MG/3ML SOPN 1.8 mcg daily. 12/19/17   [name], [name], MD    lisinopril (PRINIVIL,ZESTRIL) 10 MG tablet Take 1 tablet (10 mg total) by mouth daily. 12/19/17   [name], [name], MD  NON FORMULARY  09/21/17   [provider]  ONE TOUCH ULTRA TEST test strip Check BS before and 2 hours after meals. 09/26/17   [name], [name], MD  PHARMACIST CHOICE ALCOHOL 70 %  PADS  09/21/17   [provider]  TRAVATAN Z 0.004 % SOLN ophthalmic solution Place 1 drop into both eyes at bedtime. 10/07/16   [provider]  Vitamin D, Ergocalciferol, (DRISDOL) 1.25 MG (50000 UT) CAPS capsule TAKE ONE CAPSULE BY MOUTH TWICE A WEEK ON MONDAY AND THURSDAY 12/23/17   [name], [name], MD    Physical Exam: Vitals:   02/08/18 1400 02/08/18 1500 02/08/18 1600 02/08/18 1824  BP: (!) 157/74 (!) 143/73 (!) 148/92 (!) 186/78  Pulse: 74 73 77 73  Resp: 19 (!) 21 20 20   Temp:    98.5 F (36.9 C)  TempSrc:    Oral  SpO2: 99% 96% 97% 97%  Weight:    63.6 kg  Height:    5' (1.524 m)     . General:  Appears calm and comfortable and is NAD . Eyes:  PERRL with widely dilated pupils (chemically), EOMI, normal lids, iris; decreased visual field with left upper homonymous hemianopsia noted . ENT:  grossly normal hearing, lips & tongue, mmm . Neck:  no LAD, masses or thyromegaly; no carotid bruits . Cardiovascular:  RRR, no m/r/g. No LE edema.  Marland Kitchen Respiratory:   CTA bilaterally with no wheezes/rales/rhonchi.  Normal respiratory effort. . Abdomen:  soft, NT, ND, NABS . Skin:  no rash or induration seen on limited exam . Musculoskeletal:  grossly normal tone BUE/BLE, good ROM, no bony abnormality . Psychiatric:  grossly normal mood and affect, speech fluent and appropriate, AOx3 . Neurologic:  CN 2-12 grossly intact, moves all extremities in coordinated fashion, sensation intact    Radiological Exams on Admission: Ct Head Wo Contrast  Result Date: 02/08/2018 CLINICAL DATA:  Visual field loss, ataxia EXAM: CT HEAD WITHOUT CONTRAST TECHNIQUE: Contiguous axial images  were obtained from the base of the skull through the vertex without intravenous contrast. COMPARISON:  01/14/2017 FINDINGS: Brain: Wedge-shaped right parietal hypodensity involving subcortical white matter and overlying cortex, images 18 through 23 compatible with a subacute evolving right parietal watershed infarct. No associated intracranial hemorrhage. Additional areas of chronic white matter microvascular change. No significant mass effect, midline shift, herniation or hydrocephalus. No extra-axial fluid collection. Cisterns are patent. Cerebellar atrophy as well. Vascular: No hyperdense vessel or unexpected calcification. Skull: Normal. Negative for fracture or focal lesion. Sinuses/Orbits: No acute finding. Other: None. IMPRESSION: Right parietal hypodensity compatible with an evolving subacute right parietal watershed infarct. Additional areas of chronic white matter microvascular ischemic change. These results were called by telephone at the time of interpretation on 02/08/2018 at 4:24 pm to Dr. Francia Greaves, who verbally acknowledged these results. Electronically Signed   By: Jerilynn Mages.  Shick M.D.   On: 02/08/2018 16:24   Mr Jodene Nam Head Wo Contrast  Result Date: 02/08/2018 CLINICAL DATA:  None. Initial evaluation for acute left-sided visual defect. EXAM: MRI HEAD WITHOUT CONTRAST MRA HEAD WITHOUT CONTRAST MRA NECK WITHOUT AND WITH CONTRAST TECHNIQUE: Multiplanar, multiecho pulse sequences of the brain and surrounding structures were obtained without intravenous contrast. Angiographic images of the Circle of Willis were obtained using MRA technique without intravenous contrast. Angiographic images of the neck were obtained using MRA technique without and with intravenous contrast. Carotid stenosis measurements (when applicable) are obtained utilizing NASCET criteria, using the distal internal carotid diameter as the denominator. CONTRAST:  7 cc of Gadavist. COMPARISON:  Prior CT from earlier the same day. FINDINGS: MRI  HEAD FINDINGS Generalized age-related cerebral atrophy. Patchy T2/FLAIR hyperintensity within the periventricular white matter most like related chronic micro  vessel ischemic disease, mild in nature. Remote lacunar infarcts seen involving the right corona radiata. Associated mild wall layering in degeneration at the right cerebral peduncle. Additional small right frontal cortical infarct. Patchy areas of cortical and subcortical restricted diffusion seen involving right parieto-occipital region, compatible with acute right posterior MCA and/or MCA/PCA watershed infarct. No associated hemorrhage or mass effect. No other evidence for acute or subacute ischemia. No foci of susceptibility artifact to suggest acute or chronic intracranial hemorrhage. No mass lesion, midline shift or mass effect. No hydrocephalus. No extra-axial fluid collection. Pituitary gland normal. Midline structures intact. Major intracranial vascular flow voids well maintained. Craniocervical junction normal. Upper cervical spine normal. Bone marrow signal intensity within normal limits. No scalp soft tissue abnormality. Globes and orbital soft tissues within normal limits. Paranasal sinuses are clear. No mastoid effusion. Inner ear structures grossly normal. MRA HEAD FINDINGS ANTERIOR CIRCULATION: Distal cervical segments of the internal carotid arteries are patent with symmetric antegrade flow. Petrous, cavernous, and supraclinoid segments widely patent without hemodynamically significant stenosis. A1 segments, anterior communicating artery common anterior cerebral arteries widely patent. M1 segments patent proximally. There is focal severe stenosis involving the distal right M1 segment and right MCA bifurcation, extending into the proximal right M2 branches, most notable at the superior division (series 406, image 10). Additional severe distal right M2/M3 stenosis noted distally at the right superior division (series 406, image 15). Right MCA  branches perfused distally. On the left, there is a focal moderate to severe stenosis involving the distal left M1 segment (series 406, image 12). Left MCA bifurcation widely patent distally as are the distal left MCA branches. POSTERIOR CIRCULATION: Vertebral arteries widely patent to the vertebrobasilar junction without stenosis. Left PICA patent. Right PICA not visualized. Basilar widely patent to its distal aspect without stenosis. Superior cerebellar arteries patent bilaterally. Both of the posterior cerebral arteries primarily supplied via the basilar. Mild scattered atheromatous irregularity within the PCAs bilaterally, right greater than left, without flow-limiting stenosis. PCAs are patent to their distal aspects. No intracranial aneurysm or other vascular abnormality. MRA NECK FINDINGS Source images reviewed. Patent antegrade flow seen within the carotid and vertebral arteries bilaterally on time-of-flight sequence. Visualized aortic arch of normal caliber with normal 3 vessel morphology. No flow-limiting stenosis about the origin of the great vessels. Visualized subclavian arteries widely patent bilaterally. Right common carotid artery tortuous proximally but widely patent to the bifurcation without stenosis. No significant atheromatous narrowing about the right bifurcation. Right ICA widely patent distally to the circle-of-Willis without stenosis or occlusion. Left common carotid artery widely patent from its origin to the bifurcation. Minimal atheromatous irregularity about the left bifurcation without significant stenosis. Left ICA widely patent distally to the circle-of-Willis without stenosis or occlusion. Both of the vertebral arteries arise from the subclavian arteries. Mild to moderate stenoses seen at the origins of the vertebral arteries bilaterally (estimated 30-50%). Vertebral arteries otherwise widely patent within the neck without stenosis or occlusion. IMPRESSION: MRI HEAD IMPRESSION: 1.  Acute ischemic cortical and subcortical nonhemorrhagic infarcts involving the right parieto-occipital region as above. No associated mass effect. 2. Additional chronic infarcts involving the right corona radiata and right frontal lobe. 3. Underlying mild chronic microvascular ischemic disease. MRA HEAD IMPRESSION: 1. Negative MRA for large vessel occlusion. 2. Focal severe stenosis involving the distal right M1 segment/right MCA bifurcation, with additional downstream severe distal right M2/M3 stenosis. 3. Focal moderate to severe distal left M1 stenosis. 4. Otherwise patent intracranial circulation. No other hemodynamically significant or correctable  stenosis. MRA NECK IMPRESSION: 1. Wide patency of the common and internal carotid arteries bilaterally within the neck. 2. Mild to moderate atheromatous stenoses at the origin of the vertebral arteries bilaterally (estimated 30-50%). Vertebral arteries otherwise widely patent within the neck. Electronically Signed   By: Jeannine Boga M.D.   On: 02/08/2018 18:14   Mr Jodene Nam Neck W Wo Contrast  Result Date: 02/08/2018 CLINICAL DATA:  None. Initial evaluation for acute left-sided visual defect. EXAM: MRI HEAD WITHOUT CONTRAST MRA HEAD WITHOUT CONTRAST MRA NECK WITHOUT AND WITH CONTRAST TECHNIQUE: Multiplanar, multiecho pulse sequences of the brain and surrounding structures were obtained without intravenous contrast. Angiographic images of the Circle of Willis were obtained using MRA technique without intravenous contrast. Angiographic images of the neck were obtained using MRA technique without and with intravenous contrast. Carotid stenosis measurements (when applicable) are obtained utilizing NASCET criteria, using the distal internal carotid diameter as the denominator. CONTRAST:  7 cc of Gadavist. COMPARISON:  Prior CT from earlier the same day. FINDINGS: MRI HEAD FINDINGS Generalized age-related cerebral atrophy. Patchy T2/FLAIR hyperintensity within the  periventricular white matter most like related chronic micro vessel ischemic disease, mild in nature. Remote lacunar infarcts seen involving the right corona radiata. Associated mild wall layering in degeneration at the right cerebral peduncle. Additional small right frontal cortical infarct. Patchy areas of cortical and subcortical restricted diffusion seen involving right parieto-occipital region, compatible with acute right posterior MCA and/or MCA/PCA watershed infarct. No associated hemorrhage or mass effect. No other evidence for acute or subacute ischemia. No foci of susceptibility artifact to suggest acute or chronic intracranial hemorrhage. No mass lesion, midline shift or mass effect. No hydrocephalus. No extra-axial fluid collection. Pituitary gland normal. Midline structures intact. Major intracranial vascular flow voids well maintained. Craniocervical junction normal. Upper cervical spine normal. Bone marrow signal intensity within normal limits. No scalp soft tissue abnormality. Globes and orbital soft tissues within normal limits. Paranasal sinuses are clear. No mastoid effusion. Inner ear structures grossly normal. MRA HEAD FINDINGS ANTERIOR CIRCULATION: Distal cervical segments of the internal carotid arteries are patent with symmetric antegrade flow. Petrous, cavernous, and supraclinoid segments widely patent without hemodynamically significant stenosis. A1 segments, anterior communicating artery common anterior cerebral arteries widely patent. M1 segments patent proximally. There is focal severe stenosis involving the distal right M1 segment and right MCA bifurcation, extending into the proximal right M2 branches, most notable at the superior division (series 406, image 10). Additional severe distal right M2/M3 stenosis noted distally at the right superior division (series 406, image 15). Right MCA branches perfused distally. On the left, there is a focal moderate to severe stenosis involving the  distal left M1 segment (series 406, image 12). Left MCA bifurcation widely patent distally as are the distal left MCA branches. POSTERIOR CIRCULATION: Vertebral arteries widely patent to the vertebrobasilar junction without stenosis. Left PICA patent. Right PICA not visualized. Basilar widely patent to its distal aspect without stenosis. Superior cerebellar arteries patent bilaterally. Both of the posterior cerebral arteries primarily supplied via the basilar. Mild scattered atheromatous irregularity within the PCAs bilaterally, right greater than left, without flow-limiting stenosis. PCAs are patent to their distal aspects. No intracranial aneurysm or other vascular abnormality. MRA NECK FINDINGS Source images reviewed. Patent antegrade flow seen within the carotid and vertebral arteries bilaterally on time-of-flight sequence. Visualized aortic arch of normal caliber with normal 3 vessel morphology. No flow-limiting stenosis about the origin of the great vessels. Visualized subclavian arteries widely patent bilaterally. Right common carotid  artery tortuous proximally but widely patent to the bifurcation without stenosis. No significant atheromatous narrowing about the right bifurcation. Right ICA widely patent distally to the circle-of-Willis without stenosis or occlusion. Left common carotid artery widely patent from its origin to the bifurcation. Minimal atheromatous irregularity about the left bifurcation without significant stenosis. Left ICA widely patent distally to the circle-of-Willis without stenosis or occlusion. Both of the vertebral arteries arise from the subclavian arteries. Mild to moderate stenoses seen at the origins of the vertebral arteries bilaterally (estimated 30-50%). Vertebral arteries otherwise widely patent within the neck without stenosis or occlusion. IMPRESSION: MRI HEAD IMPRESSION: 1. Acute ischemic cortical and subcortical nonhemorrhagic infarcts involving the right parieto-occipital  region as above. No associated mass effect. 2. Additional chronic infarcts involving the right corona radiata and right frontal lobe. 3. Underlying mild chronic microvascular ischemic disease. MRA HEAD IMPRESSION: 1. Negative MRA for large vessel occlusion. 2. Focal severe stenosis involving the distal right M1 segment/right MCA bifurcation, with additional downstream severe distal right M2/M3 stenosis. 3. Focal moderate to severe distal left M1 stenosis. 4. Otherwise patent intracranial circulation. No other hemodynamically significant or correctable stenosis. MRA NECK IMPRESSION: 1. Wide patency of the common and internal carotid arteries bilaterally within the neck. 2. Mild to moderate atheromatous stenoses at the origin of the vertebral arteries bilaterally (estimated 30-50%). Vertebral arteries otherwise widely patent within the neck. Electronically Signed   By: Jeannine Boga M.D.   On: 02/08/2018 18:14   Mr Brain Wo Contrast  Result Date: 02/08/2018 CLINICAL DATA:  None. Initial evaluation for acute left-sided visual defect. EXAM: MRI HEAD WITHOUT CONTRAST MRA HEAD WITHOUT CONTRAST MRA NECK WITHOUT AND WITH CONTRAST TECHNIQUE: Multiplanar, multiecho pulse sequences of the brain and surrounding structures were obtained without intravenous contrast. Angiographic images of the Circle of Willis were obtained using MRA technique without intravenous contrast. Angiographic images of the neck were obtained using MRA technique without and with intravenous contrast. Carotid stenosis measurements (when applicable) are obtained utilizing NASCET criteria, using the distal internal carotid diameter as the denominator. CONTRAST:  7 cc of Gadavist. COMPARISON:  Prior CT from earlier the same day. FINDINGS: MRI HEAD FINDINGS Generalized age-related cerebral atrophy. Patchy T2/FLAIR hyperintensity within the periventricular white matter most like related chronic micro vessel ischemic disease, mild in nature. Remote  lacunar infarcts seen involving the right corona radiata. Associated mild wall layering in degeneration at the right cerebral peduncle. Additional small right frontal cortical infarct. Patchy areas of cortical and subcortical restricted diffusion seen involving right parieto-occipital region, compatible with acute right posterior MCA and/or MCA/PCA watershed infarct. No associated hemorrhage or mass effect. No other evidence for acute or subacute ischemia. No foci of susceptibility artifact to suggest acute or chronic intracranial hemorrhage. No mass lesion, midline shift or mass effect. No hydrocephalus. No extra-axial fluid collection. Pituitary gland normal. Midline structures intact. Major intracranial vascular flow voids well maintained. Craniocervical junction normal. Upper cervical spine normal. Bone marrow signal intensity within normal limits. No scalp soft tissue abnormality. Globes and orbital soft tissues within normal limits. Paranasal sinuses are clear. No mastoid effusion. Inner ear structures grossly normal. MRA HEAD FINDINGS ANTERIOR CIRCULATION: Distal cervical segments of the internal carotid arteries are patent with symmetric antegrade flow. Petrous, cavernous, and supraclinoid segments widely patent without hemodynamically significant stenosis. A1 segments, anterior communicating artery common anterior cerebral arteries widely patent. M1 segments patent proximally. There is focal severe stenosis involving the distal right M1 segment and right MCA bifurcation, extending into the  proximal right M2 branches, most notable at the superior division (series 406, image 10). Additional severe distal right M2/M3 stenosis noted distally at the right superior division (series 406, image 15). Right MCA branches perfused distally. On the left, there is a focal moderate to severe stenosis involving the distal left M1 segment (series 406, image 12). Left MCA bifurcation widely patent distally as are the distal  left MCA branches. POSTERIOR CIRCULATION: Vertebral arteries widely patent to the vertebrobasilar junction without stenosis. Left PICA patent. Right PICA not visualized. Basilar widely patent to its distal aspect without stenosis. Superior cerebellar arteries patent bilaterally. Both of the posterior cerebral arteries primarily supplied via the basilar. Mild scattered atheromatous irregularity within the PCAs bilaterally, right greater than left, without flow-limiting stenosis. PCAs are patent to their distal aspects. No intracranial aneurysm or other vascular abnormality. MRA NECK FINDINGS Source images reviewed. Patent antegrade flow seen within the carotid and vertebral arteries bilaterally on time-of-flight sequence. Visualized aortic arch of normal caliber with normal 3 vessel morphology. No flow-limiting stenosis about the origin of the great vessels. Visualized subclavian arteries widely patent bilaterally. Right common carotid artery tortuous proximally but widely patent to the bifurcation without stenosis. No significant atheromatous narrowing about the right bifurcation. Right ICA widely patent distally to the circle-of-Willis without stenosis or occlusion. Left common carotid artery widely patent from its origin to the bifurcation. Minimal atheromatous irregularity about the left bifurcation without significant stenosis. Left ICA widely patent distally to the circle-of-Willis without stenosis or occlusion. Both of the vertebral arteries arise from the subclavian arteries. Mild to moderate stenoses seen at the origins of the vertebral arteries bilaterally (estimated 30-50%). Vertebral arteries otherwise widely patent within the neck without stenosis or occlusion. IMPRESSION: MRI HEAD IMPRESSION: 1. Acute ischemic cortical and subcortical nonhemorrhagic infarcts involving the right parieto-occipital region as above. No associated mass effect. 2. Additional chronic infarcts involving the right corona radiata  and right frontal lobe. 3. Underlying mild chronic microvascular ischemic disease. MRA HEAD IMPRESSION: 1. Negative MRA for large vessel occlusion. 2. Focal severe stenosis involving the distal right M1 segment/right MCA bifurcation, with additional downstream severe distal right M2/M3 stenosis. 3. Focal moderate to severe distal left M1 stenosis. 4. Otherwise patent intracranial circulation. No other hemodynamically significant or correctable stenosis. MRA NECK IMPRESSION: 1. Wide patency of the common and internal carotid arteries bilaterally within the neck. 2. Mild to moderate atheromatous stenoses at the origin of the vertebral arteries bilaterally (estimated 30-50%). Vertebral arteries otherwise widely patent within the neck. Electronically Signed   By: Jeannine Boga M.D.   On: 02/08/2018 18:14    EKG: Independently reviewed.  NSR with rate 79; nonspecific ST changes with no evidence of acute ischemia   Labs on Admission: I have personally reviewed the available labs and imaging studies at the time of the admission.  Pertinent labs:   Glucose 263 Albumin 2.8 Normal CBC UA: >500 glucose; moderate LE; >300 protein; rare bacteria  Assessment/Plan Principal Problem:   Homonymous hemianopia, left Active Problems:   Type II diabetes mellitus with nephropathy (HCC)   Hyperlipidemia   Diastolic dysfunction   Cerebral infarction due to embolism of right middle cerebral artery (HCC)   HTN (hypertension)   Homonymous hemianopsia -Patient with onset of visual symptoms days ago -Difficulty driving safely -Seen by ophthalmology today with noted L homonymous concerning for R occiptal lobe CVA; sent to the ER accordingly  Acute right MCA CVA, recurrent -Symptoms concerning for CVA -CT/MRI confirm acute CVA of  right parieto-occipital region with additional chronic infarcts -Will admit for further CVA evaluation -Telemetry monitoring -There is an area of focal severe stenosis in the  region of the CVA; will defer to neurology -This CVA happened despite Plavix and so the patient appears likely to need both ASA and Plavix; will defer to neurology -Echo -Risk stratification with FLP, A1c; will also check TSH and UDS -Neurology consult -PT/OT/ST/Nutrition Consults -May require placement or CIR based on her diminished ADLs due to vision disturbance -Loop recorder interrogation requested  HTN -Allow permissive HTN for now -Treat BP only if >220/120, and then with goal of 15% reduction -Hold Norvasc and plan to restart in 48-72 hours   HLD -Recent FLP shows good control - 160/84/60/80 on 12/19/17 -Resume statin but will change Lipitor from 40 to 80 mg daily empirically   DM -Recent A1c shows very poor control - 13.5 -Continue lantus -Hold Victoza -Will order moderate-scale SSI  Diastolic dysfunction -Grade 2 diastolic dysfunction on echo in 10/18 -Appears compensated at this time     DVT prophylaxis:  Lovenox  Code Status: FULL - confirmed with patient/family Family Communication: Sister present throughout evaluation Disposition Plan:  Home once clinically improved Consults called: Neurology; PT/OT/ST/Nutrition  Admission status: Admit - It is my clinical opinion that admission to [place] is reasonable and necessary because of the expectation that this patient will require hospital care that crosses at least 2 midnights to treat this condition based on the medical complexity of the problems presented.  Given the aforementioned information, the predictability of an adverse outcome is felt to be significant.    Karmen Bongo MD [facility]   How to contact the [facility] Attending or Consulting provider [place] or covering provider during after hours [place], for this patient?  1. Check the care team in [facility] and look for a) attending/consulting [facility] provider listed and b) the [facility] team listed 2. Log into [email or web address] and use [place]'s universal password to  access. If you do not have the password, please contact the hospital operator. 3. Locate the [facility] provider you are looking for under [facility] and page to a number that you can be directly reached. 4. If you still have difficulty reaching the provider, please page the [facility] (Director on Call) for the Hospitalists listed on amion for assistance.   02/08/2018, 6:42 PM

## 2018-02-08 NOTE — Telephone Encounter (Signed)
Patient called our office thinking she had an appointment with Dr. Letta Pate tomorrow; however, she does not.  She is in the emergency room, and they think she is having another stroke.  I told her I would let Dr. Letta Pate know.

## 2018-02-08 NOTE — ED Notes (Signed)
MRI called to screen pt. They are putting the pt in for transport at this time.

## 2018-02-08 NOTE — ED Notes (Signed)
UC sent down to lab with UA

## 2018-02-08 NOTE — Consult Note (Signed)
Neurology Consultation  Reason for Consult: Left homonymous hemianopsia Referring Physician: Dr. Francia Greaves  CC: Difficulty seeing on the left side of her visual field, bumping into things on the left side  History is obtained from: Patient, chart review  HPI: Sarah [name] is a 73 y.o. female past medical history of hypertension, hyperlipidemia, prior right MCA stroke with no gross deficits but very minimal left hand fine motor deficits, diabetes, presented to the emergency room on insistence of her ophthalmologist where she was for her regular appointment. She reported that she had a regular ophthalmologist appointment scheduled today and it was found that she had a left homonymous hemianopsia on exam and she was sent for further evaluation. She reports that since about the last 3 to 4 days she has been having difficulty with vision in her left visual fields.  She also feels that she has been bumping into things from the left and does not realize that there are objects or things present on the left while walking into them.  She did not make much of it as she already had an ophthalmologist appointment for today.  She made it to her ophthalmologist office, Dr. Brigitte Pulse, who saw the patient and his examination revealed a left amount of his hemianopsia and he referred the patient to the emergency room for further imaging and neurological evaluation. Patient denies any preceding tingling numbness weakness or current tingling numbness weakness. She denies any preceding infections or fevers chills loss of appetite.  She denies any chest pain shortness of breath nausea vomiting easy bleeding or bruising.  She is currently on a Plavix.  She was on aspirin which is changed to Plavix and she is compliant her medications.   LKW:  3 days ago tpa given?: no, outside the window Premorbid modified Rankin scale (mRS): 1  ROS: ROS was performed and is negative except as noted in the HPI.   Past Medical  History:  Diagnosis Date  . Boils   . Glaucoma   . Heart murmur   . HTN (hypertension)   . Hx of adenomatous colonic polyps   . Hyperlipidemia   . Osteoporosis   . Pneumonia   . Stroke ([place])   . Type II or unspecified type diabetes mellitus without mention of complication, uncontrolled     Family History  Problem Relation Age of Onset  . Diabetes Mother   . Hypertension Mother   . Stroke Father   . Stroke Maternal Uncle   . Stroke Paternal Uncle   . Colon cancer Neg Hx   . Esophageal cancer Neg Hx   . Rectal cancer Neg Hx   . Stomach cancer Neg Hx    Social History:   reports that she quit smoking about 15 months ago. Her smoking use included cigarettes. She has never used smokeless tobacco. She reports current alcohol use. She reports that she does not use drugs.  Medications No current facility-administered medications for this encounter.   Current Outpatient Medications:  .  amLODipine (NORVASC) 5 MG tablet, TAKE 1 TABLET(5 MG) BY MOUTH DAILY, Disp: 90 tablet, Rfl: 2 .  atorvastatin (LIPITOR) 40 MG tablet, TAKE 1 TABLET(40 MG) BY MOUTH DAILY, Disp: 90 tablet, Rfl: 0 .  Blood Glucose Calibration (OT ULTRA/FASTTK CNTRL SOLN) SOLN, , Disp: , Rfl:  .  clopidogrel (PLAVIX) 75 MG tablet, TAKE 1 TABLET BY MOUTH DAILY, Disp: 90 tablet, Rfl: 2 .  Insulin Glargine (LANTUS) 100 UNIT/ML Solostar Pen, Inject 60 Units into the skin daily.,  Disp: 15 mL, Rfl: 3 .  insulin lispro (HUMALOG) 100 UNIT/ML cartridge, Inject 0.05-0.1 mLs (5-10 Units total) into the skin 3 (three) times daily with meals., Disp: 15 mL, Rfl: 6 .  Insulin Pen Needle (BD PEN NEEDLE NANO U/F) 32G X 4 MM MISC, USE UP TO FOUR TIMES DAILY, Disp: 200 each, Rfl: 2 .  Lancet Devices (SIMPLE DIAGNOSTICS LANCING DEV) MISC, Check BS before and 2 hours after meals., Disp: 200 each, Rfl: 6 .  liraglutide (VICTOZA) 18 MG/3ML SOPN, 1.8 mcg daily., Disp: 3 pen, Rfl: 3 .  lisinopril (PRINIVIL,ZESTRIL) 10 MG tablet, Take 1 tablet  (10 mg total) by mouth daily., Disp: 90 tablet, Rfl: 2 .  NON FORMULARY, , Disp: , Rfl:  .  ONE TOUCH ULTRA TEST test strip, Check BS before and 2 hours after meals., Disp: 200 each, Rfl: 6 .  PHARMACIST CHOICE ALCOHOL 70 % PADS, , Disp: , Rfl:  .  TRAVATAN Z 0.004 % SOLN ophthalmic solution, Place 1 drop into both eyes at bedtime., Disp: , Rfl: 11 .  Vitamin D, Ergocalciferol, (DRISDOL) 1.25 MG (50000 UT) CAPS capsule, TAKE ONE CAPSULE BY MOUTH TWICE A WEEK ON MONDAY AND THURSDAY, Disp: 8 capsule, Rfl: 0  Exam: Current vital signs: BP (!) 163/76   Pulse 78   Temp 97.8 F (36.6 C) (Oral)   Resp 20   Ht 5' (1.524 m)   Wt 67.6 kg   SpO2 97%   BMI 29.10 kg/m  Vital signs in last 24 hours: Temp:  [97.8 F (36.6 C)] 97.8 F (36.6 C) (01/29 1120) Pulse Rate:  [72-78] 78 (01/29 1232) Resp:  [17-20] 20 (01/29 1232) BP: (162-163)/(76-98) 163/76 (01/29 1232) SpO2:  [97 %] 97 % (01/29 1232) Weight:  [67.6 kg] 67.6 kg (01/29 1122) General: Awake alert in no distress HEENT: Normocephalic atraumatic dry mucous membranes no lymphadenopathy no thyromegaly no bruits heard Lungs: Clear to auscultation bilaterally with no wheezing Cardiovascular: S1-S2 regular rate rhythm no murmur rub gallop equal pulses bilaterally. Abdomen: Soft nondistended nontender with normoactive bowel sounds Extremities: Warm well perfused with intact pulses and no edema Neurological exam Patient awake alert oriented x3. Her speech is non-dysarthric. Naming, fluency repetition and comprehension are all intact. Cranial nerves: Pupils are equal round reactive to light, extraocular movements are intact, on visual examination, she has left homonymous hemianopsia, facial sensations intact, face is symmetric, auditory daily is intact, palate elevates midline, shoulder shrug intact, tongue midline. Motor exam: 5/5 antigravity in all fours.  Mild hesitancy and slowness in performing finger taps on the left hand but no other  gross deficits. Sensory exam: Intact light touch all over Gait testing was deferred at this time Coordination: Intact finger-nose-finger and heel-knee-shin bilaterally NIHSS - 2- left-homonymous hemianopsia   Labs I have reviewed labs in epic and the results pertinent to this consultation are:  CBC    Component Value Date/Time   WBC 7.4 02/08/2018 1143   RBC 4.08 02/08/2018 1143   HGB 12.5 02/08/2018 1143   HCT 37.7 02/08/2018 1143   PLT 323 02/08/2018 1143   MCV 92.4 02/08/2018 1143   MCH 30.6 02/08/2018 1143   MCHC 33.2 02/08/2018 1143   RDW 11.9 02/08/2018 1143   LYMPHSABS 2.0 02/08/2018 1143   MONOABS 0.4 02/08/2018 1143   EOSABS 0.1 02/08/2018 1143   BASOSABS 0.0 02/08/2018 1143    CMP     Component Value Date/Time   NA 139 02/08/2018 1143   K 3.6 02/08/2018 1143  CL 107 02/08/2018 1143   CO2 23 02/08/2018 1143   GLUCOSE 263 (H) 02/08/2018 1143   BUN 14 02/08/2018 1143   CREATININE 0.54 02/08/2018 1143   CALCIUM 9.2 02/08/2018 1143   PROT 6.5 02/08/2018 1143   ALBUMIN 2.8 (L) 02/08/2018 1143   AST 14 (L) 02/08/2018 1143   ALT 10 02/08/2018 1143   ALKPHOS 74 02/08/2018 1143   BILITOT 0.4 02/08/2018 1143   GFRNONAA >60 02/08/2018 1143   GFRAA >60 02/08/2018 1143   Imaging MRI images pending at this time. Due to symptoms being present for 3 days, I requested the ED skip CT and get an MRI  Assessment:  73 year old woman past medical history of a multifocal right MCA territory ischemia due to a possible distal right M1 occlusion and 2018 now presenting with left homonymous hemianopsia. Concerning for either a right MCA infarct or a posterior circulation infarct.  Impression Evaluate for acute ischemic stroke.  Recommendations: -Telemetry monitoring -Allow for permissive hypertension for the first 24-48h - only treat PRN if SBP >220 mmHg. Blood pressures can be gradually normalized to SBP<140 upon discharge. -MRI brain without contrast  -MR angiogram  head and MR angiogram neck -Echocardiogram -HgbA1c, fasting lipid panel -Frequent neuro checks -Prophylactic therapy-Antiplatelet med: Aspirin - dose 325mg  PO or 300mg  PR -Atorvastatin 80 mg PO daily -Risk factor modification -She has a loop recorder in place.  Loop recorder interrogation. -PT consult, OT consult, Speech consult -If Afib found on telemetry, will need anticoagulation. Decision pending imaging and stroke team rounding.  Please page stroke NP/PA/MD (listed on AMION)  from 8am-4 pm as this patient will be followed by the stroke team at this point.  -- Amie Portland, MD [facility] Pager: [phone] If 7pm to 7am, please call on call as listed on AMION.

## 2018-02-08 NOTE — ED Notes (Signed)
MRI has confirmed pt loop recorder is safe for MRI

## 2018-02-08 NOTE — ED Notes (Signed)
Patient transported to MRI 

## 2018-02-09 ENCOUNTER — Ambulatory Visit: Payer: Medicare Other | Admitting: Nurse Practitioner

## 2018-02-09 ENCOUNTER — Ambulatory Visit (HOSPITAL_COMMUNITY): Payer: Medicare Other

## 2018-02-09 DIAGNOSIS — E785 Hyperlipidemia, unspecified: Secondary | ICD-10-CM

## 2018-02-09 DIAGNOSIS — I63411 Cerebral infarction due to embolism of right middle cerebral artery: Principal | ICD-10-CM

## 2018-02-09 DIAGNOSIS — I159 Secondary hypertension, unspecified: Secondary | ICD-10-CM

## 2018-02-09 DIAGNOSIS — I639 Cerebral infarction, unspecified: Secondary | ICD-10-CM

## 2018-02-09 DIAGNOSIS — I5189 Other ill-defined heart diseases: Secondary | ICD-10-CM

## 2018-02-09 LAB — GLUCOSE, CAPILLARY
Glucose-Capillary: 239 mg/dL — ABNORMAL HIGH (ref 70–99)
Glucose-Capillary: 188 mg/dL — ABNORMAL HIGH (ref 70–99)
Glucose-Capillary: 239 mg/dL — ABNORMAL HIGH (ref 70–99)
Glucose-Capillary: 79 mg/dL (ref 70–99)
Glucose-Capillary: 251 mg/dL — ABNORMAL HIGH (ref 70–99)

## 2018-02-09 LAB — LIPID PANEL
Cholesterol: 152 mg/dL (ref 0–200)
HDL: 49 mg/dL (ref >40)
LDL Cholesterol: 76 mg/dL (ref 0–99)
Total CHOL/HDL Ratio: 3.1 RATIO
Triglycerides: 134 mg/dL (ref <150)
VLDL: 27 mg/dL (ref 0–40)

## 2018-02-09 LAB — ECHOCARDIOGRAM COMPLETE
Height: 60 in
Weight: 2243.4 oz

## 2018-02-09 LAB — HIV ANTIBODY (ROUTINE TESTING W REFLEX): HIV Screen 4th Generation wRfx: NONREACTIVE

## 2018-02-09 MED ORDER — CLOPIDOGREL BISULFATE 75 MG PO TABS
75.0000 mg | ORAL_TABLET | Freq: Every day | ORAL | Status: DC
  Administered 2018-02-09 – 2018-02-10 (×2): 75 mg via ORAL
  Filled 2018-02-09 (×2): qty 1

## 2018-02-09 NOTE — Progress Notes (Addendum)
STROKE TEAM PROGRESS NOTE   INTERVAL HISTORY Her family is at the bedside.  She has significant L hemianopia. No other complaints. Stroke workup underway.  Vitals:   02/09/18 0300 02/09/18 0354 02/09/18 0500 02/09/18 0921  BP: (!) 150/64  (!) 146/70 (!) 163/76  Pulse:    78  Resp: 16   17  Temp:  98.6 F (37 C)  98.8 F (37.1 C)  TempSrc:  Oral  Oral  SpO2:    97%  Weight:      Height:        CBC:  Recent Labs  Lab 02/08/18 1143  WBC 7.4  NEUTROABS 4.8  HGB 12.5  HCT 37.7  MCV 92.4  PLT 811    Basic Metabolic Panel:  Recent Labs  Lab 02/08/18 1143  NA 139  K 3.6  CL 107  CO2 23  GLUCOSE 263*  BUN 14  CREATININE 0.54  CALCIUM 9.2   Lipid Panel:     Component Value Date/Time   CHOL 152 02/09/2018 0413   TRIG 134 02/09/2018 0413   HDL 49 02/09/2018 0413   CHOLHDL 3.1 02/09/2018 0413   VLDL 27 02/09/2018 0413   LDLCALC 76 02/09/2018 0413   HgbA1c:  Lab Results  Component Value Date   HGBA1C 13.5 (H) 12/28/2017   Urine Drug Screen:     Component Value Date/Time   LABOPIA NONE DETECTED 01/14/2017 1415   COCAINSCRNUR NONE DETECTED 01/14/2017 1415   LABBENZ NONE DETECTED 01/14/2017 1415   AMPHETMU NONE DETECTED 01/14/2017 1415   THCU NONE DETECTED 01/14/2017 1415   LABBARB NONE DETECTED 01/14/2017 1415    Alcohol Level     Component Value Date/Time   ETH <10 01/14/2017 1206    IMAGING Ct Head Wo Contrast  Result Date: 02/08/2018 CLINICAL DATA:  Visual field loss, ataxia EXAM: CT HEAD WITHOUT CONTRAST TECHNIQUE: Contiguous axial images were obtained from the base of the skull through the vertex without intravenous contrast. COMPARISON:  01/14/2017 FINDINGS: Brain: Wedge-shaped right parietal hypodensity involving subcortical white matter and overlying cortex, images 18 through 23 compatible with a subacute evolving right parietal watershed infarct. No associated intracranial hemorrhage. Additional areas of chronic white matter microvascular change.  No significant mass effect, midline shift, herniation or hydrocephalus. No extra-axial fluid collection. Cisterns are patent. Cerebellar atrophy as well. Vascular: No hyperdense vessel or unexpected calcification. Skull: Normal. Negative for fracture or focal lesion. Sinuses/Orbits: No acute finding. Other: None. IMPRESSION: Right parietal hypodensity compatible with an evolving subacute right parietal watershed infarct. Additional areas of chronic white matter microvascular ischemic change. These results were called by telephone at the time of interpretation on 02/08/2018 at 4:24 pm to Dr. Francia Greaves, who verbally acknowledged these results. Electronically Signed   By: Jerilynn Mages.  Shick M.D.   On: 02/08/2018 16:24   Mr Jodene Nam Head Wo Contrast  Result Date: 02/08/2018 CLINICAL DATA:  None. Initial evaluation for acute left-sided visual defect. EXAM: MRI HEAD WITHOUT CONTRAST MRA HEAD WITHOUT CONTRAST MRA NECK WITHOUT AND WITH CONTRAST TECHNIQUE: Multiplanar, multiecho pulse sequences of the brain and surrounding structures were obtained without intravenous contrast. Angiographic images of the Circle of Willis were obtained using MRA technique without intravenous contrast. Angiographic images of the neck were obtained using MRA technique without and with intravenous contrast. Carotid stenosis measurements (when applicable) are obtained utilizing NASCET criteria, using the distal internal carotid diameter as the denominator. CONTRAST:  7 cc of Gadavist. COMPARISON:  Prior CT from earlier the same day. FINDINGS: MRI  HEAD FINDINGS Generalized age-related cerebral atrophy. Patchy T2/FLAIR hyperintensity within the periventricular white matter most like related chronic micro vessel ischemic disease, mild in nature. Remote lacunar infarcts seen involving the right corona radiata. Associated mild wall layering in degeneration at the right cerebral peduncle. Additional small right frontal cortical infarct. Patchy areas of cortical and  subcortical restricted diffusion seen involving right parieto-occipital region, compatible with acute right posterior MCA and/or MCA/PCA watershed infarct. No associated hemorrhage or mass effect. No other evidence for acute or subacute ischemia. No foci of susceptibility artifact to suggest acute or chronic intracranial hemorrhage. No mass lesion, midline shift or mass effect. No hydrocephalus. No extra-axial fluid collection. Pituitary gland normal. Midline structures intact. Major intracranial vascular flow voids well maintained. Craniocervical junction normal. Upper cervical spine normal. Bone marrow signal intensity within normal limits. No scalp soft tissue abnormality. Globes and orbital soft tissues within normal limits. Paranasal sinuses are clear. No mastoid effusion. Inner ear structures grossly normal. MRA HEAD FINDINGS ANTERIOR CIRCULATION: Distal cervical segments of the internal carotid arteries are patent with symmetric antegrade flow. Petrous, cavernous, and supraclinoid segments widely patent without hemodynamically significant stenosis. A1 segments, anterior communicating artery common anterior cerebral arteries widely patent. M1 segments patent proximally. There is focal severe stenosis involving the distal right M1 segment and right MCA bifurcation, extending into the proximal right M2 branches, most notable at the superior division (series 406, image 10). Additional severe distal right M2/M3 stenosis noted distally at the right superior division (series 406, image 15). Right MCA branches perfused distally. On the left, there is a focal moderate to severe stenosis involving the distal left M1 segment (series 406, image 12). Left MCA bifurcation widely patent distally as are the distal left MCA branches. POSTERIOR CIRCULATION: Vertebral arteries widely patent to the vertebrobasilar junction without stenosis. Left PICA patent. Right PICA not visualized. Basilar widely patent to its distal aspect  without stenosis. Superior cerebellar arteries patent bilaterally. Both of the posterior cerebral arteries primarily supplied via the basilar. Mild scattered atheromatous irregularity within the PCAs bilaterally, right greater than left, without flow-limiting stenosis. PCAs are patent to their distal aspects. No intracranial aneurysm or other vascular abnormality. MRA NECK FINDINGS Source images reviewed. Patent antegrade flow seen within the carotid and vertebral arteries bilaterally on time-of-flight sequence. Visualized aortic arch of normal caliber with normal 3 vessel morphology. No flow-limiting stenosis about the origin of the great vessels. Visualized subclavian arteries widely patent bilaterally. Right common carotid artery tortuous proximally but widely patent to the bifurcation without stenosis. No significant atheromatous narrowing about the right bifurcation. Right ICA widely patent distally to the circle-of-Willis without stenosis or occlusion. Left common carotid artery widely patent from its origin to the bifurcation. Minimal atheromatous irregularity about the left bifurcation without significant stenosis. Left ICA widely patent distally to the circle-of-Willis without stenosis or occlusion. Both of the vertebral arteries arise from the subclavian arteries. Mild to moderate stenoses seen at the origins of the vertebral arteries bilaterally (estimated 30-50%). Vertebral arteries otherwise widely patent within the neck without stenosis or occlusion. IMPRESSION: MRI HEAD IMPRESSION: 1. Acute ischemic cortical and subcortical nonhemorrhagic infarcts involving the right parieto-occipital region as above. No associated mass effect. 2. Additional chronic infarcts involving the right corona radiata and right frontal lobe. 3. Underlying mild chronic microvascular ischemic disease. MRA HEAD IMPRESSION: 1. Negative MRA for large vessel occlusion. 2. Focal severe stenosis involving the distal right M1  segment/right MCA bifurcation, with additional downstream severe distal right M2/M3 stenosis.  3. Focal moderate to severe distal left M1 stenosis. 4. Otherwise patent intracranial circulation. No other hemodynamically significant or correctable stenosis. MRA NECK IMPRESSION: 1. Wide patency of the common and internal carotid arteries bilaterally within the neck. 2. Mild to moderate atheromatous stenoses at the origin of the vertebral arteries bilaterally (estimated 30-50%). Vertebral arteries otherwise widely patent within the neck. Electronically Signed   By: Jeannine Boga M.D.   On: 02/08/2018 18:14   Mr Jodene Nam Neck W Wo Contrast  Result Date: 02/08/2018 CLINICAL DATA:  None. Initial evaluation for acute left-sided visual defect. EXAM: MRI HEAD WITHOUT CONTRAST MRA HEAD WITHOUT CONTRAST MRA NECK WITHOUT AND WITH CONTRAST TECHNIQUE: Multiplanar, multiecho pulse sequences of the brain and surrounding structures were obtained without intravenous contrast. Angiographic images of the Circle of Willis were obtained using MRA technique without intravenous contrast. Angiographic images of the neck were obtained using MRA technique without and with intravenous contrast. Carotid stenosis measurements (when applicable) are obtained utilizing NASCET criteria, using the distal internal carotid diameter as the denominator. CONTRAST:  7 cc of Gadavist. COMPARISON:  Prior CT from earlier the same day. FINDINGS: MRI HEAD FINDINGS Generalized age-related cerebral atrophy. Patchy T2/FLAIR hyperintensity within the periventricular white matter most like related chronic micro vessel ischemic disease, mild in nature. Remote lacunar infarcts seen involving the right corona radiata. Associated mild wall layering in degeneration at the right cerebral peduncle. Additional small right frontal cortical infarct. Patchy areas of cortical and subcortical restricted diffusion seen involving right parieto-occipital region, compatible with  acute right posterior MCA and/or MCA/PCA watershed infarct. No associated hemorrhage or mass effect. No other evidence for acute or subacute ischemia. No foci of susceptibility artifact to suggest acute or chronic intracranial hemorrhage. No mass lesion, midline shift or mass effect. No hydrocephalus. No extra-axial fluid collection. Pituitary gland normal. Midline structures intact. Major intracranial vascular flow voids well maintained. Craniocervical junction normal. Upper cervical spine normal. Bone marrow signal intensity within normal limits. No scalp soft tissue abnormality. Globes and orbital soft tissues within normal limits. Paranasal sinuses are clear. No mastoid effusion. Inner ear structures grossly normal. MRA HEAD FINDINGS ANTERIOR CIRCULATION: Distal cervical segments of the internal carotid arteries are patent with symmetric antegrade flow. Petrous, cavernous, and supraclinoid segments widely patent without hemodynamically significant stenosis. A1 segments, anterior communicating artery common anterior cerebral arteries widely patent. M1 segments patent proximally. There is focal severe stenosis involving the distal right M1 segment and right MCA bifurcation, extending into the proximal right M2 branches, most notable at the superior division (series 406, image 10). Additional severe distal right M2/M3 stenosis noted distally at the right superior division (series 406, image 15). Right MCA branches perfused distally. On the left, there is a focal moderate to severe stenosis involving the distal left M1 segment (series 406, image 12). Left MCA bifurcation widely patent distally as are the distal left MCA branches. POSTERIOR CIRCULATION: Vertebral arteries widely patent to the vertebrobasilar junction without stenosis. Left PICA patent. Right PICA not visualized. Basilar widely patent to its distal aspect without stenosis. Superior cerebellar arteries patent bilaterally. Both of the posterior cerebral  arteries primarily supplied via the basilar. Mild scattered atheromatous irregularity within the PCAs bilaterally, right greater than left, without flow-limiting stenosis. PCAs are patent to their distal aspects. No intracranial aneurysm or other vascular abnormality. MRA NECK FINDINGS Source images reviewed. Patent antegrade flow seen within the carotid and vertebral arteries bilaterally on time-of-flight sequence. Visualized aortic arch of normal caliber with normal 3 vessel  morphology. No flow-limiting stenosis about the origin of the great vessels. Visualized subclavian arteries widely patent bilaterally. Right common carotid artery tortuous proximally but widely patent to the bifurcation without stenosis. No significant atheromatous narrowing about the right bifurcation. Right ICA widely patent distally to the circle-of-Willis without stenosis or occlusion. Left common carotid artery widely patent from its origin to the bifurcation. Minimal atheromatous irregularity about the left bifurcation without significant stenosis. Left ICA widely patent distally to the circle-of-Willis without stenosis or occlusion. Both of the vertebral arteries arise from the subclavian arteries. Mild to moderate stenoses seen at the origins of the vertebral arteries bilaterally (estimated 30-50%). Vertebral arteries otherwise widely patent within the neck without stenosis or occlusion. IMPRESSION: MRI HEAD IMPRESSION: 1. Acute ischemic cortical and subcortical nonhemorrhagic infarcts involving the right parieto-occipital region as above. No associated mass effect. 2. Additional chronic infarcts involving the right corona radiata and right frontal lobe. 3. Underlying mild chronic microvascular ischemic disease. MRA HEAD IMPRESSION: 1. Negative MRA for large vessel occlusion. 2. Focal severe stenosis involving the distal right M1 segment/right MCA bifurcation, with additional downstream severe distal right M2/M3 stenosis. 3. Focal  moderate to severe distal left M1 stenosis. 4. Otherwise patent intracranial circulation. No other hemodynamically significant or correctable stenosis. MRA NECK IMPRESSION: 1. Wide patency of the common and internal carotid arteries bilaterally within the neck. 2. Mild to moderate atheromatous stenoses at the origin of the vertebral arteries bilaterally (estimated 30-50%). Vertebral arteries otherwise widely patent within the neck. Electronically Signed   By: Jeannine Boga M.D.   On: 02/08/2018 18:14   Mr Brain Wo Contrast  Result Date: 02/08/2018 CLINICAL DATA:  None. Initial evaluation for acute left-sided visual defect. EXAM: MRI HEAD WITHOUT CONTRAST MRA HEAD WITHOUT CONTRAST MRA NECK WITHOUT AND WITH CONTRAST TECHNIQUE: Multiplanar, multiecho pulse sequences of the brain and surrounding structures were obtained without intravenous contrast. Angiographic images of the Circle of Willis were obtained using MRA technique without intravenous contrast. Angiographic images of the neck were obtained using MRA technique without and with intravenous contrast. Carotid stenosis measurements (when applicable) are obtained utilizing NASCET criteria, using the distal internal carotid diameter as the denominator. CONTRAST:  7 cc of Gadavist. COMPARISON:  Prior CT from earlier the same day. FINDINGS: MRI HEAD FINDINGS Generalized age-related cerebral atrophy. Patchy T2/FLAIR hyperintensity within the periventricular white matter most like related chronic micro vessel ischemic disease, mild in nature. Remote lacunar infarcts seen involving the right corona radiata. Associated mild wall layering in degeneration at the right cerebral peduncle. Additional small right frontal cortical infarct. Patchy areas of cortical and subcortical restricted diffusion seen involving right parieto-occipital region, compatible with acute right posterior MCA and/or MCA/PCA watershed infarct. No associated hemorrhage or mass effect. No  other evidence for acute or subacute ischemia. No foci of susceptibility artifact to suggest acute or chronic intracranial hemorrhage. No mass lesion, midline shift or mass effect. No hydrocephalus. No extra-axial fluid collection. Pituitary gland normal. Midline structures intact. Major intracranial vascular flow voids well maintained. Craniocervical junction normal. Upper cervical spine normal. Bone marrow signal intensity within normal limits. No scalp soft tissue abnormality. Globes and orbital soft tissues within normal limits. Paranasal sinuses are clear. No mastoid effusion. Inner ear structures grossly normal. MRA HEAD FINDINGS ANTERIOR CIRCULATION: Distal cervical segments of the internal carotid arteries are patent with symmetric antegrade flow. Petrous, cavernous, and supraclinoid segments widely patent without hemodynamically significant stenosis. A1 segments, anterior communicating artery common anterior cerebral arteries widely patent. M1 segments  patent proximally. There is focal severe stenosis involving the distal right M1 segment and right MCA bifurcation, extending into the proximal right M2 branches, most notable at the superior division (series 406, image 10). Additional severe distal right M2/M3 stenosis noted distally at the right superior division (series 406, image 15). Right MCA branches perfused distally. On the left, there is a focal moderate to severe stenosis involving the distal left M1 segment (series 406, image 12). Left MCA bifurcation widely patent distally as are the distal left MCA branches. POSTERIOR CIRCULATION: Vertebral arteries widely patent to the vertebrobasilar junction without stenosis. Left PICA patent. Right PICA not visualized. Basilar widely patent to its distal aspect without stenosis. Superior cerebellar arteries patent bilaterally. Both of the posterior cerebral arteries primarily supplied via the basilar. Mild scattered atheromatous irregularity within the PCAs  bilaterally, right greater than left, without flow-limiting stenosis. PCAs are patent to their distal aspects. No intracranial aneurysm or other vascular abnormality. MRA NECK FINDINGS Source images reviewed. Patent antegrade flow seen within the carotid and vertebral arteries bilaterally on time-of-flight sequence. Visualized aortic arch of normal caliber with normal 3 vessel morphology. No flow-limiting stenosis about the origin of the great vessels. Visualized subclavian arteries widely patent bilaterally. Right common carotid artery tortuous proximally but widely patent to the bifurcation without stenosis. No significant atheromatous narrowing about the right bifurcation. Right ICA widely patent distally to the circle-of-Willis without stenosis or occlusion. Left common carotid artery widely patent from its origin to the bifurcation. Minimal atheromatous irregularity about the left bifurcation without significant stenosis. Left ICA widely patent distally to the circle-of-Willis without stenosis or occlusion. Both of the vertebral arteries arise from the subclavian arteries. Mild to moderate stenoses seen at the origins of the vertebral arteries bilaterally (estimated 30-50%). Vertebral arteries otherwise widely patent within the neck without stenosis or occlusion. IMPRESSION: MRI HEAD IMPRESSION: 1. Acute ischemic cortical and subcortical nonhemorrhagic infarcts involving the right parieto-occipital region as above. No associated mass effect. 2. Additional chronic infarcts involving the right corona radiata and right frontal lobe. 3. Underlying mild chronic microvascular ischemic disease. MRA HEAD IMPRESSION: 1. Negative MRA for large vessel occlusion. 2. Focal severe stenosis involving the distal right M1 segment/right MCA bifurcation, with additional downstream severe distal right M2/M3 stenosis. 3. Focal moderate to severe distal left M1 stenosis. 4. Otherwise patent intracranial circulation. No other  hemodynamically significant or correctable stenosis. MRA NECK IMPRESSION: 1. Wide patency of the common and internal carotid arteries bilaterally within the neck. 2. Mild to moderate atheromatous stenoses at the origin of the vertebral arteries bilaterally (estimated 30-50%). Vertebral arteries otherwise widely patent within the neck. Electronically Signed   By: Jeannine Boga M.D.   On: 02/08/2018 18:14   2D Echocardiogram  pending    PHYSICAL EXAM General: Awake alert in no distress HEENT: Normocephalic atraumatic  Lungs: respirations unlabored Cardiovascular: S1-S2 regular rate rhythm  Extremities: Warm well perfused with intact pulses and no edema Neurological exam Patient awake alert oriented x3. Her speech is non-dysarthric. Naming, fluency repetition and comprehension are all intact. Cranial nerves: Pupils are equal round reactive to light, extraocular movements are intact, on visual examination, she has dense left homonymous hemianopsia, facial sensations intact, face is symmetric, auditory is intact, palate elevates midline, shoulder shrug intact, tongue midline. Motor exam: 5/5 antigravity in all fours.  no drift. FMM intact Sensory exam: Intact light touch all over Coordination: Intact finger-nose-finger bilaterally   ASSESSMENT/PLAN Ms. DRENDA SOBECKI is a 73 y.o. female with  history of hypertension, hyperlipidemia, prior right MCA stroke with no gross deficits but very minimal left hand fine motor deficits, diabetes presenting with left homonymous hemianopsia at the insistence of her opthamologist who saw her today. .   Stroke:  right parieto-occipital infarcts in setting of progressive R MCA stenosis, infarcts secondary to large vessel disease source  Resultant L hemianopia   CT head R parietal watershed infarct. Small vessel disease.   MRI  Acute cortical and subcortical R P-O infarcts. Old R CR and R frontal lobe infarcts. Small vessel disease.   MRA head   No LVO. Focal stenosis R M1, R M2/M3, L M1  MRA neck B VA origin atherosclerosis 30-50%  2D Echo  pending   Loop interrogation - last check 1/3 w/o AF - interrogation today without AF  LDL 76  HgbA1c 13.5  Lovenox 40 mg sq daily for VTE prophylaxis  clopidogrel 75 mg daily prior to admission, now on aspirin 325 mg daily and clopidogrel 75 mg daily. Continue DAPT x 3 months then plavix alone  Therapy recommendations:  OP OT and PT  Disposition:  Home  Patient advised not to drive  Hypertension  Stable . Permissive hypertension (OK if < 220/120) but gradually normalize in 5-7 days . Long-term BP goal normotensive  Hyperlipidemia  Home meds:  lipitor 40  Increased to lipitor 80 on admission  LDL 76, goal < 70  Continue statin at discharge  Diabetes type II  HgbA1c 13.5, goal < 7.0  Uncontrolled  Other Stroke Risk Factors  Advanced age  Former Cigarette smoker, quit 15 mos ago  ETOH use, advised to drink no more than 1 drink(s) a day  Overweight Body mass index is 27.38 kg/m., recommend weight loss, diet and exercise as appropriate   Hx stroke/TIA  10/2016 R MCA scattered infarcts w/ R M2 stenosis, felt to be embolic. CD neg. CTA neg. 2D ok. TEE neg. Loop placed.   Family hx stroke (father, maternal uncle, paternal uncle)  Diastolic dysfunction  Hospital day # [place], MSN, APRN, ANVP-BC, AGPCNP-BC Advanced Practice Stroke Nurse [place] for Schedule & Pager information 02/09/2018 4:46 PM   ATTENDING NOTE: I reviewed above note and agree with the assessment and plan. Pt was seen and examined.   73 year old female with history of hypertension, hyperlipidemia, diabetes, former smoker, stroke in 10/2016 admitted for left hemianopia.  Patient had a stroke in 10/2016 with left-sided weakness.  MRI showed right MCA scattered infarcts.  MRA showed right M2 occlusion.  CTA neck unremarkable.  Carotid Doppler right 40 to  59% stenosis.  EF 55 to 60%.  TEE unremarkable, LDL 98 and A1c 12.9.  Loop recorder placed.  Patient was discharged with DAPT and Lipitor 40.  On this admission, patient had visual disturbance, went to see ophthalmologist diagnosed with left hemianopia before sending to ER for evaluation.  Patient was admitted and CT as well as MRI showed right inferior MCA infarct.  MRA head and neck showed progression of intracranial stenosis from 2018 with right distal M1 and proximal M2 high-grade stenosis as well as left M1 moderate to severe stenosis.  A1c 13.5 and LDL 76. EF 60 to 65%.  Loop recorder interrogated, no A. fib found.  On exam, patient sitting in chair, awake alert, AAO x3, neuro intact except left hemianopia.  Patient stroke this time likely due to continued progression of intracranial stenosis especially right MCA including M1 and M2 segments secondary to  uncontrolled risk factors especially uncontrolled diabetes.  Recommend aspirin 325 and Plavix DAPT for 3 months and then Plavix alone given severe intracranial stenosis.  Continue Lipitor 80 for stroke prevention.  Stroke risk factor modification.  PT/OT recommend outpatient PT/OT.  Neurology will sign off. Please call with questions. Pt will follow up with stroke clinic NP at [facility] in about 4 weeks. Thanks for the consult.   Rosalin Hawking, MD PhD Stroke Neurology 02/09/2018 10:42 PM     To contact Stroke Continuity provider, please refer to [email or web address] After hours, contact General Neurology

## 2018-02-09 NOTE — Telephone Encounter (Signed)
Ok we may get PMR consult soon

## 2018-02-09 NOTE — Progress Notes (Signed)
Nutrition Brief Note  RD consulted to assess pt due to CVA.  Spoke with pt at bedside who denies any nutrition-related issues. Pt states that she consumed at least 75% of breakfast and "always" has a good appetite. Pt denies any recent weight changes.  Wt Readings from Last 15 Encounters:  02/08/18 63.6 kg  12/19/17 63.8 kg  09/26/17 63.2 kg  09/08/17 58.1 kg  07/29/17 64.2 kg  06/02/17 65.8 kg  03/16/17 63.7 kg  03/09/17 62.7 kg  12/08/16 66.4 kg  12/07/16 67 kg  11/02/16 65.4 kg  10/20/16 61 kg  10/15/16 63.1 kg  08/09/16 66.5 kg  03/01/16 66.8 kg    Body mass index is 27.38 kg/m. Patient meets criteria for overweight based on current BMI.   Current diet order is Heart Healthy/Carb Modified, patient is consuming approximately 100% of meals at this time. Labs and medications reviewed.   No nutrition interventions warranted at this time. If nutrition issues arise, please consult RD.    Gaynell Face, MS, RD, LDN Inpatient Clinical Dietitian Pager: [phone] Weekend/After Hours: [phone]

## 2018-02-09 NOTE — Progress Notes (Addendum)
Spoke with patient about her diabetes.  States that she was diagnosed about 20 years ago. Sees Dr. Betty [name] as her PCP. Takes Lantus 60 units daily and Victoza daily. Has only been on the Victoza for about 2 months. States that she was scheduled for an appointment with "[name]" at [facility] for diabetes counseling. Recommended that she call to let them know that she is in the hospital and she can reschedule that appointment. States that she checks blood sugars at home which usually show 250-300 mg/dl. Did remind her that the high blood sugars could contribute to her stroke.  Reviewed her A1C of 13.5% being that her blood sugars have been close to 400 mg/dl average over a 2-3 month period. It has been up and down over time.  Has been taking Lantus everyday at home; states that she has been able to afford the cost, so far. Also states that she tends to drink regular Pepsi more than she should.   Will continue to monitor blood sugars while in the hospital.  Harvel Ricks RN BSN CDE Diabetes Coordinator Pager: [phone]  8am-5pm

## 2018-02-09 NOTE — Evaluation (Signed)
Speech Language Pathology Evaluation Patient Details Name: Sarah [name] MRN: [id number] DOB: 06-10-1945 Today's Date: 02/09/2018 Time: 4098-1191 SLP Time Calculation (min) (ACUTE ONLY): 75 min  Problem List:  Patient Active Problem List   Diagnosis Date Noted  . Homonymous hemianopia, left 02/08/2018  . HTN (hypertension)   . Trigger finger, right index finger 10/17/2017  . Low back pain 07/29/2017  . Carotid artery stenosis 11/02/2016  . Left arm weakness   . Diastolic dysfunction   . Hypertension with heart disease   . Tobacco abuse   . Acute blood loss anemia   . Cerebral infarction due to embolism of right middle cerebral artery ([place])   . Perirectal abscess 09/26/2015  . Vitamin D deficiency 07/23/2015  . Osteopenia 02/16/2013  . Ankle fracture 01/19/2013  . Anemia 09/27/2011  . Type II diabetes mellitus with nephropathy ([place]) 09/24/2011  . Hyperlipidemia 09/24/2011   Past Medical History:  Past Medical History:  Diagnosis Date  . Boils   . Glaucoma   . Heart murmur   . HTN (hypertension)   . Hx of adenomatous colonic polyps   . Hyperlipidemia   . Osteoporosis   . Pneumonia   . Stroke ([place])   . Type II or unspecified type diabetes mellitus without mention of complication, uncontrolled    Past Surgical History:  Past Surgical History:  Procedure Laterality Date  . ABDOMINAL HYSTERECTOMY    . CARPAL TUNNEL RELEASE     right  . COLONOSCOPY  12-10-10   per Dr. Deatra Ina, clear, repeat in 7 yrs   . INCISION AND DRAINAGE PERIRECTAL ABSCESS N/A 09/26/2015   Procedure: IRRIGATION AND DEBRIDEMENT PERIRECTAL ABSCESS;  Surgeon: Mickeal Skinner, MD;  Location: [place];  Service: General;  Laterality: N/A;  . KNEE ARTHROSCOPY     right  . LOOP RECORDER INSERTION N/A 10/19/2016   Procedure: LOOP RECORDER INSERTION;  Surgeon: Thompson Grayer, MD;  Location: [place] [facility];  Service: Cardiovascular;  Laterality: N/A;  . ORIF ANKLE FRACTURE Right 03/24/2012   Procedure: OPEN REDUCTION INTERNAL FIXATION (ORIF) ANKLE FRACTURE;  Surgeon: Newt Minion, MD;  Location: [place];  Service: Orthopedics;  Laterality: Right;  Open Reduction Internal Fixation Right Bimalleolar ankle fracture  . POLYPECTOMY    . TEE WITHOUT CARDIOVERSION N/A 10/18/2016   Procedure: TRANSESOPHAGEAL ECHOCARDIOGRAM (TEE);  Surgeon: Fay Records, MD;  Location: [facility] ENDOSCOPY;  Service: Cardiovascular;  Laterality: N/A;  . TONSILLECTOMY     HPI:  Pt is a 73 y.o. female with medical history significant of CVA; DM: HLD; and HTN presenting with visual field defect.  3-4 days ago she started getting dizzy.  She had an eye doctor appointment already scheduled and so she went in.  The eye doctor examined her and was concerned for a stroke and sent her to [facility] for evaluation.  Peripheral vision decreased in both but worse on the left.  Also seeing blurriness.  +diplopia bilaterally and presents with left homonymous hemianopsia.  No dysarthria but her throat feels hoarse.  No dysphagia.  No extremity symptoms. She had a stroke in 10/17.  No residual deficits. MRI on 1/30 revealed: Acute ischemic cortical and subcortical nonhemorrhagic infarcts involving the right parieto-occipital region as above. No associated mass effect. Additional chronic infarcts involving the right corona radiata.   Assessment / Plan / Recommendation Clinical Impression  Despite left homonymous hemianopia pt able to demosntrate reading at paragraph level, complex functional and verbal problem soving, alternating and divided attention and short  term working Marine scientist. Pt denies any new cognitive deficits related to this CVA. She is in the learning process with PT and OT to compensatory for visual impairment. No need for SLP f/u.     SLP Assessment  SLP Recommendation/Assessment: Patient does not need any further Speech Lanaguage Pathology Services SLP Visit Diagnosis: Attention and concentration deficit Attention and concentration  deficit following: Cerebral infarction    Follow Up Recommendations  24 hour supervision/assistance    Frequency and Duration           SLP Evaluation Cognition  Overall Cognitive Status: Within Functional Limits for tasks assessed Arousal/Alertness: Awake/alert Orientation Level: Oriented X4 Attention: Divided;Alternating Alternating Attention: Appears intact Divided Attention: Appears intact Memory: Appears intact Awareness: Appears intact Problem Solving: Appears intact       Comprehension  Auditory Comprehension Overall Auditory Comprehension: Appears within functional limits for tasks assessed Reading Comprehension Reading Status: Within funtional limits    Expression Verbal Expression Overall Verbal Expression: Appears within functional limits for tasks assessed Written Expression Dominant Hand: Right   Oral / Motor  Oral Motor/Sensory Function Overall Oral Motor/Sensory Function: Within functional limits Motor Speech Overall Motor Speech: Appears within functional limits for tasks assessed   GO                   Herbie Baltimore, MA [place] Pager [phone] Office [phone]  Lynann Beaver 02/09/2018, 12:44 PM

## 2018-02-09 NOTE — Evaluation (Addendum)
Physical Therapy Evaluation Patient Details Name: Sarah [name] MRN: [id number] DOB: 11-17-1945 Today's Date: 02/09/2018   History of Present Illness  Pt is a 73 y.o. female admitted 02/08/18 with visual field deficit. MRI showed acute nonhemorrhagic infarcts involving R parieto-occipital region; chronic infarcts in R corona radiata. PMH includes CVA (2018), HTN, DM, HLD.    Clinical Impression  Pt presents with an overall decrease in functional mobility secondary to above. PTA, pt indep and lives with mother; has supportive sister who can drive. Today, pt moving well, balance only seems limited by visual field deficits; intermittent min guard for balance due to pt intermittently running into objects in hallway. Pt very receptive to education regarding compensatory strategies. Educ on precautions, positioning, therex, and importance of mobility. Pt would benefit from continued acute PT services to maximize functional mobility and independence prior to d/c with outpatient neuro PT.     Follow Up Recommendations Outpatient Neuro PT;Supervision for mobility/OOB    Equipment Recommendations  None recommended by PT    Recommendations for Other Services       Precautions / Restrictions Precautions Precautions: Fall Precaution Comments: L visual field deficit Restrictions Weight Bearing Restrictions: No      Mobility  Bed Mobility Overal bed mobility: Independent                Transfers Overall transfer level: Needs assistance Equipment used: None Transfers: Sit to/from Stand Sit to Stand: Supervision            Ambulation/Gait Ambulation/Gait assistance: Min guard;Supervision Gait Distance (Feet): 300 Feet Assistive device: None Gait Pattern/deviations: Step-through pattern;Decreased stride length Gait velocity: Decreased Gait velocity interpretation: 1.31 - 2.62 ft/sec, indicative of limited community ambulator General Gait Details: Slow, mostly steady gait  with intermittent min guard for balance. Pt able to scan well to left, but intermittently runs into objects on L-side; able to self-correct without LOB, but requires cues to clear objects fully. Pt with guarded gait secondary to this holding arms out to side, slow. Able to navigate back to room  Stairs            Wheelchair Mobility    Modified Rankin (Stroke Patients Only)       Balance Overall balance assessment: Needs assistance   Sitting balance-Leahy Scale: Good       Standing balance-Leahy Scale: Good                               Pertinent Vitals/Pain Pain Assessment: No/denies pain    Home Living Family/patient expects to be discharged to:: Private residence Living Arrangements: Parent Available Help at Discharge: Family;Available 24 hours/day Type of Home: House Home Access: Stairs to enter Entrance Stairs-Rails: None Entrance Stairs-Number of Steps: 2 Home Layout: Two level;Bed/bath upstairs Home Equipment: Walker - 2 wheels;Kasandra Knudsen - single point Additional Comments: Lives with mother who is independent but does not drive. Sister lives nearby and able to provide assist/transportation    Prior Function Level of Independence: Independent         Comments: Indep, drives     Hand Dominance   Dominant Hand: Right    Extremity/Trunk Assessment   Upper Extremity Assessment Upper Extremity Assessment: Overall WFL for tasks assessed    Lower Extremity Assessment Lower Extremity Assessment: Overall WFL for tasks assessed    Cervical / Trunk Assessment Cervical / Trunk Assessment: Normal  Communication   Communication: No difficulties  Cognition Arousal/Alertness: Awake/alert  Behavior During Therapy: WFL for tasks assessed/performed Overall Cognitive Status: Within Functional Limits for tasks assessed                                        General Comments      Exercises     Assessment/Plan    PT Assessment  Patient needs continued PT services  PT Problem List Decreased balance;Decreased mobility       PT Treatment Interventions DME instruction;Gait training;Stair training;Functional mobility training;Therapeutic activities;Therapeutic exercise;Balance training;Patient/family education;Neuromuscular re-education    PT Goals (Current goals can be found in the Care Plan section)  Acute Rehab PT Goals Patient Stated Goal: Return home; improved vision PT Goal Formulation: With patient Time For Goal Achievement: 02/23/18 Potential to Achieve Goals: Good    Frequency Min 4X/week   Barriers to discharge        Co-evaluation               AM-PAC PT "6 Clicks" Mobility  Outcome Measure Help needed turning from your back to your side while in a flat bed without using bedrails?: None Help needed moving from lying on your back to sitting on the side of a flat bed without using bedrails?: None Help needed moving to and from a bed to a chair (including a wheelchair)?: A Little Help needed standing up from a chair using your arms (e.g., wheelchair or bedside chair)?: A Little Help needed to walk in hospital room?: A Little Help needed climbing 3-5 steps with a railing? : A Little 6 Click Score: 20    End of Session Equipment Utilized During Treatment: Gait belt Activity Tolerance: Patient tolerated treatment well Patient left: in chair;with call bell/phone within reach;with chair alarm set Nurse Communication: Mobility status PT Visit Diagnosis: Other abnormalities of gait and mobility (R26.89)    Time: 8757-9728 PT Time Calculation (min) (ACUTE ONLY): 28 min   Charges:   PT Evaluation $PT Eval Moderate Complexity: 1 Mod PT Treatments $Gait Training: 8-22 mins      Mabeline Caras, PT, DPT Acute Rehabilitation Services  Pager [phone] Office [place] 02/09/2018, 12:58 PM

## 2018-02-09 NOTE — Progress Notes (Signed)
Echocardiogram 2D Echocardiogram has been performed.  02/09/2018 2:52 PM Maudry Mayhew, MHA, RVT, RDCS, RDMS

## 2018-02-09 NOTE — Progress Notes (Signed)
PROGRESS NOTE        PATIENT DETAILS Name: Sarah [name] Age: 73 y.o. Sex: female Date of Birth: Feb 25, 1945 Admit Date: 02/08/2018 Admitting Physician Karmen Bongo, MD [id number], Malka So, MD  Brief Narrative: Patient is a 73 y.o. female prior history of CVA, DM, HLD, HTN-presenting with left homonymous hemianopsia, MRI positive for CVA.  See below for further details  Subjective: Thinks her vision in left eye may be slightly better than yesterday.  No chest pain or shortness of breath.  Assessment/Plan: Acute ischemic CVA with left homonymous hemianopsia:Essentially unchanged compared to yesterday.  MRI brain positive for acute ischemic cortical and subcortical infarction in the right parieto-occipital region.  MRA brain with severe focal stenosis involving the distal right M1 segment.  MRA neck without any major stenosis.  Telemetry without any arrhythmias.  Awaiting transthoracic echo.  A1c pending-but last A1c was 13.5, LDL 76.  Await further recommendations from rehab team and stroke MD.  In the meantime, continue with Plavix, aspirin and statin.  Hypertension: Allow permissive hypertension-continue to hold antihypertensives.  Dyslipidemia: Continue statin-LDL 76.  Insulin-dependent DM-2: Most recent A1c December 2019 was 13.5!Marland Kitchen  CBGs relatively stable-continue Lantus 60 units daily and SSI.  Chronic diastolic heart failure: Euvolemic on exam  DVT Prophylaxis: Prophylactic Lovenox   Code Status: Full code   Family Communication: None at bedside  Disposition Plan: Remain inpatient  Antimicrobial agents: Anti-infectives (From admission, onward)   None      Procedures: None  CONSULTS:  neurology  Time spent: 25- minutes-Greater than 50% of this time was spent in counseling, explanation of diagnosis, planning of further management, and coordination of care.  MEDICATIONS: Scheduled Meds: .  stroke: mapping our early stages of  recovery book   Does not apply Once  . aspirin  300 mg Rectal Daily   Or  . aspirin  325 mg Oral Daily  . atorvastatin  80 mg Oral q1800  . clopidogrel  75 mg Oral Daily  . enoxaparin (LOVENOX) injection  40 mg Subcutaneous Q24H  . insulin aspart  0-15 Units Subcutaneous TID WC  . insulin aspart  0-5 Units Subcutaneous QHS  . insulin glargine  60 Units Subcutaneous Daily  . latanoprost  1 drop Both Eyes QHS  . Netarsudil-Latanoprost  1 drop Both Eyes QPM   Continuous Infusions: . sodium chloride 50 mL/hr at 02/09/18 0300   PRN Meds:.acetaminophen **OR** acetaminophen (TYLENOL) oral liquid 160 mg/5 mL **OR** acetaminophen, senna-docusate   PHYSICAL EXAM: Vital signs: Vitals:   02/09/18 0300 02/09/18 0354 02/09/18 0500 02/09/18 0921  BP: (!) 150/64  (!) 146/70 (!) 163/76  Pulse:    78  Resp: 16   17  Temp:  98.6 F (37 C)  98.8 F (37.1 C)  TempSrc:  Oral  Oral  SpO2:    97%  Weight:      Height:       Filed Weights   02/08/18 1122 02/08/18 1824  Weight: 67.6 kg 63.6 kg   Body mass index is 27.38 kg/m.   General appearance :Awake, alert, not in any distress. Eyes:.Pink conjunctiva HEENT: Atraumatic and Normocephalic Neck: supple, no JVD. No cervical lymphadenopathy. No thyromegaly Resp:Good air entry bilaterally, no added sounds  CVS: S1 S2 regular GI: Bowel sounds present, Non tender and not distended with no gaurding, rigidity or rebound.No organomegaly Extremities: B/L  Lower Ext shows no edema, both legs are warm to touch Neurology: Moves all 4 extremities-continues to have significant left homonymous hemianopsia. Psychiatric: Normal judgment and insight. Alert and oriented x 3. Normal mood. Musculoskeletal:No digital cyanosis Skin:No Rash, warm and dry Wounds:N/A  I have personally reviewed following labs and imaging studies  LABORATORY DATA: CBC: Recent Labs  Lab 02/08/18 1143  WBC 7.4  NEUTROABS 4.8  HGB 12.5  HCT 37.7  MCV 92.4  PLT 323     Basic Metabolic Panel: Recent Labs  Lab 02/08/18 1143  NA 139  K 3.6  CL 107  CO2 23  GLUCOSE 263*  BUN 14  CREATININE 0.54  CALCIUM 9.2    GFR: Estimated Creatinine Clearance: 54.4 mL/min (by C-G formula based on SCr of 0.54 mg/dL).  Liver Function Tests: Recent Labs  Lab 02/08/18 1143  AST 14*  ALT 10  ALKPHOS 74  BILITOT 0.4  PROT 6.5  ALBUMIN 2.8*   No results for input(s): LIPASE, AMYLASE in the last 168 hours. No results for input(s): AMMONIA in the last 168 hours.  Coagulation Profile: No results for input(s): INR, PROTIME in the last 168 hours.  Cardiac Enzymes: No results for input(s): CKTOTAL, CKMB, CKMBINDEX, TROPONINI in the last 168 hours.  BNP (last 3 results) No results for input(s): PROBNP in the last 8760 hours.  HbA1C: No results for input(s): HGBA1C in the last 72 hours.  CBG: Recent Labs  Lab 02/08/18 1143 02/08/18 1843 02/08/18 2154 02/09/18 0855  GLUCAP 248* 239* 238* 188*    Lipid Profile: Recent Labs    02/09/18 0413  CHOL 152  HDL 49  LDLCALC 76  TRIG 134  CHOLHDL 3.1    Thyroid Function Tests: No results for input(s): TSH, T4TOTAL, FREET4, T3FREE, THYROIDAB in the last 72 hours.  Anemia Panel: No results for input(s): VITAMINB12, FOLATE, FERRITIN, TIBC, IRON, RETICCTPCT in the last 72 hours.  Urine analysis:    Component Value Date/Time   COLORURINE YELLOW 02/08/2018 1137   APPEARANCEUR HAZY (A) 02/08/2018 1137   LABSPEC 1.023 02/08/2018 1137   PHURINE 5.0 02/08/2018 1137   GLUCOSEU >=500 (A) 02/08/2018 1137   HGBUR SMALL (A) 02/08/2018 1137   BILIRUBINUR NEGATIVE 02/08/2018 1137   BILIRUBINUR n 03/06/2015 0948   KETONESUR NEGATIVE 02/08/2018 1137   PROTEINUR >=300 (A) 02/08/2018 1137   UROBILINOGEN 1.0 03/06/2015 0948   UROBILINOGEN 1.0 09/23/2011 2130   NITRITE NEGATIVE 02/08/2018 1137   LEUKOCYTESUR MODERATE (A) 02/08/2018 1137    Sepsis Labs: Lactic Acid, Venous    Component Value Date/Time    LATICACIDVEN 0.99 09/26/2015 1350    MICROBIOLOGY: No results found for this or any previous visit (from the past 240 hour(s)).  RADIOLOGY STUDIES/RESULTS: Ct Head Wo Contrast  Result Date: 02/08/2018 CLINICAL DATA:  Visual field loss, ataxia EXAM: CT HEAD WITHOUT CONTRAST TECHNIQUE: Contiguous axial images were obtained from the base of the skull through the vertex without intravenous contrast. COMPARISON:  01/14/2017 FINDINGS: Brain: Wedge-shaped right parietal hypodensity involving subcortical white matter and overlying cortex, images 18 through 23 compatible with a subacute evolving right parietal watershed infarct. No associated intracranial hemorrhage. Additional areas of chronic white matter microvascular change. No significant mass effect, midline shift, herniation or hydrocephalus. No extra-axial fluid collection. Cisterns are patent. Cerebellar atrophy as well. Vascular: No hyperdense vessel or unexpected calcification. Skull: Normal. Negative for fracture or focal lesion. Sinuses/Orbits: No acute finding. Other: None. IMPRESSION: Right parietal hypodensity compatible with an evolving subacute right parietal  watershed infarct. Additional areas of chronic white matter microvascular ischemic change. These results were called by telephone at the time of interpretation on 02/08/2018 at 4:24 pm to Dr. Francia Greaves, who verbally acknowledged these results. Electronically Signed   By: Jerilynn Mages.  Shick M.D.   On: 02/08/2018 16:24   Mr Jodene Nam Head Wo Contrast  Result Date: 02/08/2018 CLINICAL DATA:  None. Initial evaluation for acute left-sided visual defect. EXAM: MRI HEAD WITHOUT CONTRAST MRA HEAD WITHOUT CONTRAST MRA NECK WITHOUT AND WITH CONTRAST TECHNIQUE: Multiplanar, multiecho pulse sequences of the brain and surrounding structures were obtained without intravenous contrast. Angiographic images of the Circle of Willis were obtained using MRA technique without intravenous contrast. Angiographic images of the  neck were obtained using MRA technique without and with intravenous contrast. Carotid stenosis measurements (when applicable) are obtained utilizing NASCET criteria, using the distal internal carotid diameter as the denominator. CONTRAST:  7 cc of Gadavist. COMPARISON:  Prior CT from earlier the same day. FINDINGS: MRI HEAD FINDINGS Generalized age-related cerebral atrophy. Patchy T2/FLAIR hyperintensity within the periventricular white matter most like related chronic micro vessel ischemic disease, mild in nature. Remote lacunar infarcts seen involving the right corona radiata. Associated mild wall layering in degeneration at the right cerebral peduncle. Additional small right frontal cortical infarct. Patchy areas of cortical and subcortical restricted diffusion seen involving right parieto-occipital region, compatible with acute right posterior MCA and/or MCA/PCA watershed infarct. No associated hemorrhage or mass effect. No other evidence for acute or subacute ischemia. No foci of susceptibility artifact to suggest acute or chronic intracranial hemorrhage. No mass lesion, midline shift or mass effect. No hydrocephalus. No extra-axial fluid collection. Pituitary gland normal. Midline structures intact. Major intracranial vascular flow voids well maintained. Craniocervical junction normal. Upper cervical spine normal. Bone marrow signal intensity within normal limits. No scalp soft tissue abnormality. Globes and orbital soft tissues within normal limits. Paranasal sinuses are clear. No mastoid effusion. Inner ear structures grossly normal. MRA HEAD FINDINGS ANTERIOR CIRCULATION: Distal cervical segments of the internal carotid arteries are patent with symmetric antegrade flow. Petrous, cavernous, and supraclinoid segments widely patent without hemodynamically significant stenosis. A1 segments, anterior communicating artery common anterior cerebral arteries widely patent. M1 segments patent proximally. There is  focal severe stenosis involving the distal right M1 segment and right MCA bifurcation, extending into the proximal right M2 branches, most notable at the superior division (series 406, image 10). Additional severe distal right M2/M3 stenosis noted distally at the right superior division (series 406, image 15). Right MCA branches perfused distally. On the left, there is a focal moderate to severe stenosis involving the distal left M1 segment (series 406, image 12). Left MCA bifurcation widely patent distally as are the distal left MCA branches. POSTERIOR CIRCULATION: Vertebral arteries widely patent to the vertebrobasilar junction without stenosis. Left PICA patent. Right PICA not visualized. Basilar widely patent to its distal aspect without stenosis. Superior cerebellar arteries patent bilaterally. Both of the posterior cerebral arteries primarily supplied via the basilar. Mild scattered atheromatous irregularity within the PCAs bilaterally, right greater than left, without flow-limiting stenosis. PCAs are patent to their distal aspects. No intracranial aneurysm or other vascular abnormality. MRA NECK FINDINGS Source images reviewed. Patent antegrade flow seen within the carotid and vertebral arteries bilaterally on time-of-flight sequence. Visualized aortic arch of normal caliber with normal 3 vessel morphology. No flow-limiting stenosis about the origin of the great vessels. Visualized subclavian arteries widely patent bilaterally. Right common carotid artery tortuous proximally but widely patent to the  bifurcation without stenosis. No significant atheromatous narrowing about the right bifurcation. Right ICA widely patent distally to the circle-of-Willis without stenosis or occlusion. Left common carotid artery widely patent from its origin to the bifurcation. Minimal atheromatous irregularity about the left bifurcation without significant stenosis. Left ICA widely patent distally to the circle-of-Willis without  stenosis or occlusion. Both of the vertebral arteries arise from the subclavian arteries. Mild to moderate stenoses seen at the origins of the vertebral arteries bilaterally (estimated 30-50%). Vertebral arteries otherwise widely patent within the neck without stenosis or occlusion. IMPRESSION: MRI HEAD IMPRESSION: 1. Acute ischemic cortical and subcortical nonhemorrhagic infarcts involving the right parieto-occipital region as above. No associated mass effect. 2. Additional chronic infarcts involving the right corona radiata and right frontal lobe. 3. Underlying mild chronic microvascular ischemic disease. MRA HEAD IMPRESSION: 1. Negative MRA for large vessel occlusion. 2. Focal severe stenosis involving the distal right M1 segment/right MCA bifurcation, with additional downstream severe distal right M2/M3 stenosis. 3. Focal moderate to severe distal left M1 stenosis. 4. Otherwise patent intracranial circulation. No other hemodynamically significant or correctable stenosis. MRA NECK IMPRESSION: 1. Wide patency of the common and internal carotid arteries bilaterally within the neck. 2. Mild to moderate atheromatous stenoses at the origin of the vertebral arteries bilaterally (estimated 30-50%). Vertebral arteries otherwise widely patent within the neck. Electronically Signed   By: Jeannine Boga M.D.   On: 02/08/2018 18:14   Mr Jodene Nam Neck W Wo Contrast  Result Date: 02/08/2018 CLINICAL DATA:  None. Initial evaluation for acute left-sided visual defect. EXAM: MRI HEAD WITHOUT CONTRAST MRA HEAD WITHOUT CONTRAST MRA NECK WITHOUT AND WITH CONTRAST TECHNIQUE: Multiplanar, multiecho pulse sequences of the brain and surrounding structures were obtained without intravenous contrast. Angiographic images of the Circle of Willis were obtained using MRA technique without intravenous contrast. Angiographic images of the neck were obtained using MRA technique without and with intravenous contrast. Carotid stenosis  measurements (when applicable) are obtained utilizing NASCET criteria, using the distal internal carotid diameter as the denominator. CONTRAST:  7 cc of Gadavist. COMPARISON:  Prior CT from earlier the same day. FINDINGS: MRI HEAD FINDINGS Generalized age-related cerebral atrophy. Patchy T2/FLAIR hyperintensity within the periventricular white matter most like related chronic micro vessel ischemic disease, mild in nature. Remote lacunar infarcts seen involving the right corona radiata. Associated mild wall layering in degeneration at the right cerebral peduncle. Additional small right frontal cortical infarct. Patchy areas of cortical and subcortical restricted diffusion seen involving right parieto-occipital region, compatible with acute right posterior MCA and/or MCA/PCA watershed infarct. No associated hemorrhage or mass effect. No other evidence for acute or subacute ischemia. No foci of susceptibility artifact to suggest acute or chronic intracranial hemorrhage. No mass lesion, midline shift or mass effect. No hydrocephalus. No extra-axial fluid collection. Pituitary gland normal. Midline structures intact. Major intracranial vascular flow voids well maintained. Craniocervical junction normal. Upper cervical spine normal. Bone marrow signal intensity within normal limits. No scalp soft tissue abnormality. Globes and orbital soft tissues within normal limits. Paranasal sinuses are clear. No mastoid effusion. Inner ear structures grossly normal. MRA HEAD FINDINGS ANTERIOR CIRCULATION: Distal cervical segments of the internal carotid arteries are patent with symmetric antegrade flow. Petrous, cavernous, and supraclinoid segments widely patent without hemodynamically significant stenosis. A1 segments, anterior communicating artery common anterior cerebral arteries widely patent. M1 segments patent proximally. There is focal severe stenosis involving the distal right M1 segment and right MCA bifurcation, extending  into the proximal right M2 branches, most  notable at the superior division (series 406, image 10). Additional severe distal right M2/M3 stenosis noted distally at the right superior division (series 406, image 15). Right MCA branches perfused distally. On the left, there is a focal moderate to severe stenosis involving the distal left M1 segment (series 406, image 12). Left MCA bifurcation widely patent distally as are the distal left MCA branches. POSTERIOR CIRCULATION: Vertebral arteries widely patent to the vertebrobasilar junction without stenosis. Left PICA patent. Right PICA not visualized. Basilar widely patent to its distal aspect without stenosis. Superior cerebellar arteries patent bilaterally. Both of the posterior cerebral arteries primarily supplied via the basilar. Mild scattered atheromatous irregularity within the PCAs bilaterally, right greater than left, without flow-limiting stenosis. PCAs are patent to their distal aspects. No intracranial aneurysm or other vascular abnormality. MRA NECK FINDINGS Source images reviewed. Patent antegrade flow seen within the carotid and vertebral arteries bilaterally on time-of-flight sequence. Visualized aortic arch of normal caliber with normal 3 vessel morphology. No flow-limiting stenosis about the origin of the great vessels. Visualized subclavian arteries widely patent bilaterally. Right common carotid artery tortuous proximally but widely patent to the bifurcation without stenosis. No significant atheromatous narrowing about the right bifurcation. Right ICA widely patent distally to the circle-of-Willis without stenosis or occlusion. Left common carotid artery widely patent from its origin to the bifurcation. Minimal atheromatous irregularity about the left bifurcation without significant stenosis. Left ICA widely patent distally to the circle-of-Willis without stenosis or occlusion. Both of the vertebral arteries arise from the subclavian arteries. Mild to  moderate stenoses seen at the origins of the vertebral arteries bilaterally (estimated 30-50%). Vertebral arteries otherwise widely patent within the neck without stenosis or occlusion. IMPRESSION: MRI HEAD IMPRESSION: 1. Acute ischemic cortical and subcortical nonhemorrhagic infarcts involving the right parieto-occipital region as above. No associated mass effect. 2. Additional chronic infarcts involving the right corona radiata and right frontal lobe. 3. Underlying mild chronic microvascular ischemic disease. MRA HEAD IMPRESSION: 1. Negative MRA for large vessel occlusion. 2. Focal severe stenosis involving the distal right M1 segment/right MCA bifurcation, with additional downstream severe distal right M2/M3 stenosis. 3. Focal moderate to severe distal left M1 stenosis. 4. Otherwise patent intracranial circulation. No other hemodynamically significant or correctable stenosis. MRA NECK IMPRESSION: 1. Wide patency of the common and internal carotid arteries bilaterally within the neck. 2. Mild to moderate atheromatous stenoses at the origin of the vertebral arteries bilaterally (estimated 30-50%). Vertebral arteries otherwise widely patent within the neck. Electronically Signed   By: Jeannine Boga M.D.   On: 02/08/2018 18:14   Mr Brain Wo Contrast  Result Date: 02/08/2018 CLINICAL DATA:  None. Initial evaluation for acute left-sided visual defect. EXAM: MRI HEAD WITHOUT CONTRAST MRA HEAD WITHOUT CONTRAST MRA NECK WITHOUT AND WITH CONTRAST TECHNIQUE: Multiplanar, multiecho pulse sequences of the brain and surrounding structures were obtained without intravenous contrast. Angiographic images of the Circle of Willis were obtained using MRA technique without intravenous contrast. Angiographic images of the neck were obtained using MRA technique without and with intravenous contrast. Carotid stenosis measurements (when applicable) are obtained utilizing NASCET criteria, using the distal internal carotid  diameter as the denominator. CONTRAST:  7 cc of Gadavist. COMPARISON:  Prior CT from earlier the same day. FINDINGS: MRI HEAD FINDINGS Generalized age-related cerebral atrophy. Patchy T2/FLAIR hyperintensity within the periventricular white matter most like related chronic micro vessel ischemic disease, mild in nature. Remote lacunar infarcts seen involving the right corona radiata. Associated mild wall layering in degeneration at  the right cerebral peduncle. Additional small right frontal cortical infarct. Patchy areas of cortical and subcortical restricted diffusion seen involving right parieto-occipital region, compatible with acute right posterior MCA and/or MCA/PCA watershed infarct. No associated hemorrhage or mass effect. No other evidence for acute or subacute ischemia. No foci of susceptibility artifact to suggest acute or chronic intracranial hemorrhage. No mass lesion, midline shift or mass effect. No hydrocephalus. No extra-axial fluid collection. Pituitary gland normal. Midline structures intact. Major intracranial vascular flow voids well maintained. Craniocervical junction normal. Upper cervical spine normal. Bone marrow signal intensity within normal limits. No scalp soft tissue abnormality. Globes and orbital soft tissues within normal limits. Paranasal sinuses are clear. No mastoid effusion. Inner ear structures grossly normal. MRA HEAD FINDINGS ANTERIOR CIRCULATION: Distal cervical segments of the internal carotid arteries are patent with symmetric antegrade flow. Petrous, cavernous, and supraclinoid segments widely patent without hemodynamically significant stenosis. A1 segments, anterior communicating artery common anterior cerebral arteries widely patent. M1 segments patent proximally. There is focal severe stenosis involving the distal right M1 segment and right MCA bifurcation, extending into the proximal right M2 branches, most notable at the superior division (series 406, image 10).  Additional severe distal right M2/M3 stenosis noted distally at the right superior division (series 406, image 15). Right MCA branches perfused distally. On the left, there is a focal moderate to severe stenosis involving the distal left M1 segment (series 406, image 12). Left MCA bifurcation widely patent distally as are the distal left MCA branches. POSTERIOR CIRCULATION: Vertebral arteries widely patent to the vertebrobasilar junction without stenosis. Left PICA patent. Right PICA not visualized. Basilar widely patent to its distal aspect without stenosis. Superior cerebellar arteries patent bilaterally. Both of the posterior cerebral arteries primarily supplied via the basilar. Mild scattered atheromatous irregularity within the PCAs bilaterally, right greater than left, without flow-limiting stenosis. PCAs are patent to their distal aspects. No intracranial aneurysm or other vascular abnormality. MRA NECK FINDINGS Source images reviewed. Patent antegrade flow seen within the carotid and vertebral arteries bilaterally on time-of-flight sequence. Visualized aortic arch of normal caliber with normal 3 vessel morphology. No flow-limiting stenosis about the origin of the great vessels. Visualized subclavian arteries widely patent bilaterally. Right common carotid artery tortuous proximally but widely patent to the bifurcation without stenosis. No significant atheromatous narrowing about the right bifurcation. Right ICA widely patent distally to the circle-of-Willis without stenosis or occlusion. Left common carotid artery widely patent from its origin to the bifurcation. Minimal atheromatous irregularity about the left bifurcation without significant stenosis. Left ICA widely patent distally to the circle-of-Willis without stenosis or occlusion. Both of the vertebral arteries arise from the subclavian arteries. Mild to moderate stenoses seen at the origins of the vertebral arteries bilaterally (estimated 30-50%).  Vertebral arteries otherwise widely patent within the neck without stenosis or occlusion. IMPRESSION: MRI HEAD IMPRESSION: 1. Acute ischemic cortical and subcortical nonhemorrhagic infarcts involving the right parieto-occipital region as above. No associated mass effect. 2. Additional chronic infarcts involving the right corona radiata and right frontal lobe. 3. Underlying mild chronic microvascular ischemic disease. MRA HEAD IMPRESSION: 1. Negative MRA for large vessel occlusion. 2. Focal severe stenosis involving the distal right M1 segment/right MCA bifurcation, with additional downstream severe distal right M2/M3 stenosis. 3. Focal moderate to severe distal left M1 stenosis. 4. Otherwise patent intracranial circulation. No other hemodynamically significant or correctable stenosis. MRA NECK IMPRESSION: 1. Wide patency of the common and internal carotid arteries bilaterally within the neck. 2. Mild to moderate  atheromatous stenoses at the origin of the vertebral arteries bilaterally (estimated 30-50%). Vertebral arteries otherwise widely patent within the neck. Electronically Signed   By: Jeannine Boga M.D.   On: 02/08/2018 18:14     LOS: 1 day   Oren Binet, MD  [facility]  If 7PM-7AM, please contact night-coverage  Please page via [email or web address] [email or web address]-click on MD name and type text message  02/09/2018, 11:50 AM

## 2018-02-09 NOTE — Progress Notes (Signed)
Patient had good night. No changes in neuro status noted.

## 2018-02-09 NOTE — Consult Note (Signed)
   [facility] Inpatient Consult   02/09/2018  CLARENCE DUNSMORE Oct 21, 1945 [id number]  Patient was assessed for [place] Management for community services. Patient was previously had outreaches by the RN Telephonic nurse for [facility] stroke follow up with [facility] in the past.  Patient's complaint of visual disturbances.   Chart review reveals patient is per NP notes: Ms. JAHNAVI MURATORE is a 73 y.o. female with history of hypertension, hyperlipidemia, prior right MCA stroke with no gross deficits but very minimal left hand fine motor deficits, diabetes presenting with left homonymous hemianopsia at the insistence of her opthamologist who saw her today. .   Stroke:  right parieto-occipital infarcts in setting of progressive R MCA stenosis, infarcts secondary to large vessel disease source  CT head R parietal watershed infarct. Small vessel disease.   MRI  Acute cortical and subcortical R P-O infarcts. Old R CR and R frontal lobe infarcts. Small vessel disease.     Of note, [facility] Care Management services does not replace or interfere with any services that are arranged by inpatient case management or social work. For additional questions or referrals please contact:  Natividad Brood, RN BSN [facility] Liaison  [phone] business mobile phone Toll free office [phone]

## 2018-02-09 NOTE — Evaluation (Signed)
Occupational Therapy Evaluation Patient Details Name: Sarah [name] MRN: [id number] DOB: 02/12/1945 Today's Date: 02/09/2018    History of Present Illness Pt is a 73 y.o. female admitted 02/08/18 with visual field deficit. MRI showed acute nonhemorrhagic infarcts involving R parieto-occipital region; chronic infarcts in R corona radiata. PMH includes CVA (2018), HTN, DM, HLD.   Clinical Impression   Pt is at supervision level with ADLs/selfcare and mobility with no AD due to L eye visual filed deficits/ pt reports blurred vision and shadows in L eye. Pt educated on safety and compensatory techniques and eye glasses occlusion techniques to improve vision. All education is completed and no further acute OT is indicated at this time    Follow Up Recommendations  Outpatient OT;Supervision - Intermittent(neuro rehab for vision)    Equipment Recommendations  None recommended by OT    Recommendations for Other Services       Precautions / Restrictions Precautions Precautions: Fall Precaution Comments: L visual field deficit Restrictions Weight Bearing Restrictions: No      Mobility Bed Mobility Overal bed mobility: Independent                Transfers Overall transfer level: Needs assistance Equipment used: None Transfers: Sit to/from Stand Sit to Stand: Supervision              Balance Overall balance assessment: Needs assistance Sitting-balance support: No upper extremity supported;Feet supported Sitting balance-Leahy Scale: Good     Standing balance support: No upper extremity supported;During functional activity Standing balance-Leahy Scale: Good                             ADL either performed or assessed with clinical judgement   ADL                                         General ADL Comments: supervision due to L eye visual fiels deficits     Vision Baseline Vision/History: Wears glasses Wears Glasses: At all  times Patient Visual Report: Central vision impairment;Blurring of vision;Peripheral vision impairment Vision Assessment?: Vision impaired- to be further tested in functional context Additional Comments: pt reports that her sister just recently found her eye glasses that had been missing, but she does not have them with her at hospital     Perception     Praxis      Pertinent Vitals/Pain Pain Assessment: No/denies pain     Hand Dominance Right   Extremity/Trunk Assessment Upper Extremity Assessment Upper Extremity Assessment: Overall WFL for tasks assessed   Lower Extremity Assessment Lower Extremity Assessment: Defer to PT evaluation   Cervical / Trunk Assessment Cervical / Trunk Assessment: Normal   Communication Communication Communication: No difficulties   Cognition Arousal/Alertness: Awake/alert Behavior During Therapy: WFL for tasks assessed/performed Overall Cognitive Status: Within Functional Limits for tasks assessed                                     General Comments       Exercises     Shoulder Instructions      Home Living Family/patient expects to be discharged to:: Private residence Living Arrangements: Parent Available Help at Discharge: Family;Available 24 hours/day Type of Home: House Home Access: Stairs to enter [employer] of  Steps: 2 Entrance Stairs-Rails: None Home Layout: Two level;Bed/bath upstairs Alternate Level Stairs-Number of Steps: Flight Alternate Level Stairs-Rails: Right;Left;Can reach both Bathroom Shower/Tub: Tub/shower unit         Home Equipment: Environmental consultant - 2 wheels;Kasandra Knudsen - single point   Additional Comments: Lives with mother who is independent but does not drive. Sister lives nearby and able to provide assist/transportation      Prior Functioning/Environment Level of Independence: Independent        Comments: Indep, drives        OT Problem List: Impaired vision/perception      OT  Treatment/Interventions:      OT Goals(Current goals can be found in the care plan section) Acute Rehab OT Goals Patient Stated Goal: Return home; improved vision OT Goal Formulation: With patient  OT Frequency:     Barriers to D/C:    no barriers       Co-evaluation              AM-PAC OT "6 Clicks" Daily Activity     Outcome Measure Help from another person eating meals?: None Help from another person taking care of personal grooming?: None Help from another person toileting, which includes using toliet, bedpan, or urinal?: None Help from another person bathing (including washing, rinsing, drying)?: None Help from another person to put on and taking off regular upper body clothing?: None Help from another person to put on and taking off regular lower body clothing?: None 6 Click Score: 24   End of Session    Activity Tolerance: Patient tolerated treatment well Patient left: in chair  OT Visit Diagnosis: Other (comment);Low vision, both eyes (H54.2)(L visual field impaired)                Time: 1251-1315 OT Time Calculation (min): 24 min Charges:  OT General Charges $OT Visit: 1 Visit OT Evaluation $OT Eval Low Complexity: 1 Low OT Treatments $Therapeutic Activity: 8-22 mins    Britt Bottom 02/09/2018, 1:29 PM

## 2018-02-09 NOTE — Plan of Care (Signed)

## 2018-02-10 ENCOUNTER — Ambulatory Visit: Payer: Medicare Other | Admitting: Gastroenterology

## 2018-02-10 DIAGNOSIS — I10 Essential (primary) hypertension: Secondary | ICD-10-CM

## 2018-02-10 DIAGNOSIS — E1121 Type 2 diabetes mellitus with diabetic nephropathy: Secondary | ICD-10-CM

## 2018-02-10 LAB — GLUCOSE, CAPILLARY
Glucose-Capillary: 94 mg/dL (ref 70–99)
Glucose-Capillary: 168 mg/dL — ABNORMAL HIGH (ref 70–99)

## 2018-02-10 MED ORDER — ASPIRIN 325 MG PO TABS: 325.0000 mg | ORAL_TABLET | Freq: Every day | ORAL | 0 refills | Status: DC

## 2018-02-10 MED ORDER — ATORVASTATIN CALCIUM 80 MG PO TABS: 80.0000 mg | ORAL_TABLET | Freq: Every day | ORAL | 0 refills | Status: DC

## 2018-02-10 NOTE — Discharge Summary (Signed)
PATIENT DETAILS Name: CHRISIE JANKOVICH Age: 73 y.o. Sex: female Date of Birth: Jun 06, 1945 MRN: [id number]. Admitting Physician: Karmen Bongo, MD RKY:[id number], Malka So, MD  Admit Date: 02/08/2018 Discharge date: 02/10/2018  Recommendations for Outpatient Follow-up:  1. Follow up with PCP in 1-2 weeks 2. Please obtain BMP/CBC in one week 3. Ensure follow-up with neurology 4. Aspirin 325 mg and Plavix 75 mg for 3 months-then after 3 months stop aspirin and continue with just Plavix alone.  Admitted From:  Home  Disposition: Outpatient physical therapy   Home Health: No  Equipment/Devices: None  Discharge Condition: Stable  CODE STATUS: FULL CODE  Diet recommendation:  Heart Healthy/ diabetic diet  Brief Summary: See H&P, Labs, Consult and Test reports for all details in brief,Patient is a 73 y.o. female prior history of CVA, DM, HLD, HTN-presenting with left homonymous hemianopsia, MRI positive for CVA.  See below for further details  Brief Hospital Course: Acute ischemic CVA with left homonymous hemianopsia:Essentially unchanged compared to yesterday.  MRI brain positive for acute ischemic cortical and subcortical infarction in the right parieto-occipital region.  MRA brain with severe focal stenosis involving the distal right M1 segment.  MRA neck without any major stenosis.  Telemetry without any arrhythmias.  Transthoracic echo without any embolic source. Last A1c in December was 13.5, LDL 76.    Commendations from stroke team are to continue with aspirin and Plavix for 3 months, followed by Plavix alone.  Continue high intensity statin on discharge.  She will follow-up with outpatient neurology.  Case management will arrange outpatient PT.  Hypertension: Permissive hypertension was initially allowed-resume all antihypertensives on discharge.  Dyslipidemia: Continue statin-LDL 76.  Insulin-dependent DM-2: Most recent A1c December 2019 was 13.5!.    CBG  stable-resume usual diabetic regimen on discharge.  Further optimization deferred to the outpatient setting.  Chronic diastolic heart failure: Euvolemic on exam  Procedures/Studies: None  Discharge Diagnoses:  Principal Problem:   Homonymous hemianopia, left Active Problems:   Type II diabetes mellitus with nephropathy (HCC)   Hyperlipidemia   Diastolic dysfunction   Cerebral infarction due to embolism of right middle cerebral artery (HCC)   HTN (hypertension)   Discharge Instructions:  Activity:  As tolerated   Discharge Instructions    Ambulatory referral to Neurology   Complete by:  As directed    Follow up with stroke clinic NP (Jessica Vanschaick or Cecille Rubin, if both not available, consider Zachery Dauer, or Ahern) at [facility] in about 4 weeks. Thanks.   Ambulatory referral to Neurology   Complete by:  As directed    An appointment is requested in approximately: 2 weeks   Diet - low sodium heart healthy   Complete by:  As directed    Discharge instructions   Complete by:  As directed    Follow with Primary MD  [name], [name], MD 1 week  Aspirin 325 mg and Plavix 75 mg for 3 months-then after 3 months stop aspirin and continue with just Plavix alone.  Follow-up with your primary neurologist at [facility].  Please get a complete blood count and chemistry panel checked by your Primary MD at your next visit, and again as instructed by your Primary MD.  Get Medicines reviewed and adjusted: Please take all your medications with you for your next visit with your Primary MD  Laboratory/radiological data: Please request your Primary MD to go over all hospital tests and procedure/radiological results at the follow up, please ask your Primary  MD to get all Hospital records sent to his/her office.  In some cases, they will be blood work, cultures and biopsy results pending at the time of your discharge. Please request that your primary care M.D.  follows up on these results.  Also Note the following: If you experience worsening of your admission symptoms, develop shortness of breath, life threatening emergency, suicidal or homicidal thoughts you must seek medical attention immediately by calling 911 or calling your MD immediately  if symptoms less severe.  You must read complete instructions/literature along with all the possible adverse reactions/side effects for all the Medicines you take and that have been prescribed to you. Take any new Medicines after you have completely understood and accpet all the possible adverse reactions/side effects.   Do not drive when taking Pain medications or sleeping medications (Benzodaizepines)  Do not take more than prescribed Pain, Sleep and Anxiety Medications. It is not advisable to combine anxiety,sleep and pain medications without talking with your primary care practitioner  Special Instructions: If you have smoked or chewed Tobacco  in the last 2 yrs please stop smoking, stop any regular Alcohol  and or any Recreational drug use.  Wear Seat belts while driving.  Please note: You were cared for by a hospitalist during your hospital stay. Once you are discharged, your primary care physician will handle any further medical issues. Please note that NO REFILLS for any discharge medications will be authorized once you are discharged, as it is imperative that you return to your primary care physician (or establish a relationship with a primary care physician if you do not have one) for your post hospital discharge needs so that they can reassess your need for medications and monitor your lab values.   Increase activity slowly   Complete by:  As directed      Allergies as of 02/10/2018   No Known Allergies     Medication List    TAKE these medications   acetaminophen 500 MG tablet Commonly known as:  TYLENOL Take 1,000 mg by mouth every 6 (six) hours as needed for mild pain or headache.     amLODipine 5 MG tablet Commonly known as:  NORVASC TAKE 1 TABLET(5 MG) BY MOUTH DAILY What changed:  See the new instructions.   aspirin 325 MG tablet Take 1 tablet (325 mg total) by mouth daily.   atorvastatin 80 MG tablet Commonly known as:  LIPITOR Take 1 tablet (80 mg total) by mouth daily at 6 PM. What changed:    medication strength  See the new instructions.   calcium carbonate 750 MG chewable tablet Commonly known as:  TUMS EX Chew 1 tablet by mouth as needed for heartburn.   clopidogrel 75 MG tablet Commonly known as:  PLAVIX TAKE 1 TABLET BY MOUTH DAILY   Insulin Glargine 100 UNIT/ML Solostar Pen Commonly known as:  LANTUS Inject 60 Units into the skin daily.   Insulin Pen Needle 32G X 4 MM Misc Commonly known as:  BD PEN NEEDLE NANO U/F USE UP TO FOUR TIMES DAILY   liraglutide 18 MG/3ML Sopn Commonly known as:  VICTOZA 1.8 mcg daily.   lisinopril 10 MG tablet Commonly known as:  PRINIVIL,ZESTRIL Take 1 tablet (10 mg total) by mouth daily.   ONE TOUCH ULTRA TEST test strip Generic drug:  glucose blood Check BS before and 2 hours after meals.   OT ULTRA/FASTTK CNTRL SOLN Soln   PHARMACIST CHOICE ALCOHOL 70 % Misc Generic drug:  Isopropyl  Alcohol   ROCKLATAN 0.02-0.005 % Soln Generic drug:  Netarsudil-Latanoprost Place 1 drop into both eyes every evening.   SIMPLE DIAGNOSTICS LANCING DEV Misc Check BS before and 2 hours after meals.   TRAVATAN Z 0.004 % Soln ophthalmic solution Generic drug:  Travoprost (BAK Free) Place 1 drop into both eyes at bedtime.   Vitamin D (Ergocalciferol) 1.25 MG (50000 UT) Caps capsule Commonly known as:  DRISDOL TAKE ONE CAPSULE BY MOUTH TWICE A WEEK ON MONDAY AND THURSDAY What changed:  See the new instructions.      Follow-up Information    [facility]. Schedule an appointment as soon as possible for a visit in 4 week(s).   Specialty:  Neurology Contact information: [address]  [place] [phone]       [name], [name], MD. Schedule an appointment as soon as possible for a visit in 1 week(s).   Specialty:  Family Medicine Contact information: [place] [address] [phone]          No Known Allergies  Consultations:   neurology  Other Procedures/Studies: Ct Head Wo Contrast  Result Date: 02/08/2018 CLINICAL DATA:  Visual field loss, ataxia EXAM: CT HEAD WITHOUT CONTRAST TECHNIQUE: Contiguous axial images were obtained from the base of the skull through the vertex without intravenous contrast. COMPARISON:  01/14/2017 FINDINGS: Brain: Wedge-shaped right parietal hypodensity involving subcortical white matter and overlying cortex, images 18 through 23 compatible with a subacute evolving right parietal watershed infarct. No associated intracranial hemorrhage. Additional areas of chronic white matter microvascular change. No significant mass effect, midline shift, herniation or hydrocephalus. No extra-axial fluid collection. Cisterns are patent. Cerebellar atrophy as well. Vascular: No hyperdense vessel or unexpected calcification. Skull: Normal. Negative for fracture or focal lesion. Sinuses/Orbits: No acute finding. Other: None. IMPRESSION: Right parietal hypodensity compatible with an evolving subacute right parietal watershed infarct. Additional areas of chronic white matter microvascular ischemic change. These results were called by telephone at the time of interpretation on 02/08/2018 at 4:24 pm to Dr. Francia Greaves, who verbally acknowledged these results. Electronically Signed   By: Jerilynn Mages.  Shick M.D.   On: 02/08/2018 16:24   Mr Jodene Nam Head Wo Contrast  Result Date: 02/08/2018 CLINICAL DATA:  None. Initial evaluation for acute left-sided visual defect. EXAM: MRI HEAD WITHOUT CONTRAST MRA HEAD WITHOUT CONTRAST MRA NECK WITHOUT AND WITH CONTRAST TECHNIQUE: Multiplanar, multiecho pulse sequences of the brain and  surrounding structures were obtained without intravenous contrast. Angiographic images of the Circle of Willis were obtained using MRA technique without intravenous contrast. Angiographic images of the neck were obtained using MRA technique without and with intravenous contrast. Carotid stenosis measurements (when applicable) are obtained utilizing NASCET criteria, using the distal internal carotid diameter as the denominator. CONTRAST:  7 cc of Gadavist. COMPARISON:  Prior CT from earlier the same day. FINDINGS: MRI HEAD FINDINGS Generalized age-related cerebral atrophy. Patchy T2/FLAIR hyperintensity within the periventricular white matter most like related chronic micro vessel ischemic disease, mild in nature. Remote lacunar infarcts seen involving the right corona radiata. Associated mild wall layering in degeneration at the right cerebral peduncle. Additional small right frontal cortical infarct. Patchy areas of cortical and subcortical restricted diffusion seen involving right parieto-occipital region, compatible with acute right posterior MCA and/or MCA/PCA watershed infarct. No associated hemorrhage or mass effect. No other evidence for acute or subacute ischemia. No foci of susceptibility artifact to suggest acute or chronic intracranial hemorrhage. No mass lesion, midline shift  or mass effect. No hydrocephalus. No extra-axial fluid collection. Pituitary gland normal. Midline structures intact. Major intracranial vascular flow voids well maintained. Craniocervical junction normal. Upper cervical spine normal. Bone marrow signal intensity within normal limits. No scalp soft tissue abnormality. Globes and orbital soft tissues within normal limits. Paranasal sinuses are clear. No mastoid effusion. Inner ear structures grossly normal. MRA HEAD FINDINGS ANTERIOR CIRCULATION: Distal cervical segments of the internal carotid arteries are patent with symmetric antegrade flow. Petrous, cavernous, and supraclinoid  segments widely patent without hemodynamically significant stenosis. A1 segments, anterior communicating artery common anterior cerebral arteries widely patent. M1 segments patent proximally. There is focal severe stenosis involving the distal right M1 segment and right MCA bifurcation, extending into the proximal right M2 branches, most notable at the superior division (series 406, image 10). Additional severe distal right M2/M3 stenosis noted distally at the right superior division (series 406, image 15). Right MCA branches perfused distally. On the left, there is a focal moderate to severe stenosis involving the distal left M1 segment (series 406, image 12). Left MCA bifurcation widely patent distally as are the distal left MCA branches. POSTERIOR CIRCULATION: Vertebral arteries widely patent to the vertebrobasilar junction without stenosis. Left PICA patent. Right PICA not visualized. Basilar widely patent to its distal aspect without stenosis. Superior cerebellar arteries patent bilaterally. Both of the posterior cerebral arteries primarily supplied via the basilar. Mild scattered atheromatous irregularity within the PCAs bilaterally, right greater than left, without flow-limiting stenosis. PCAs are patent to their distal aspects. No intracranial aneurysm or other vascular abnormality. MRA NECK FINDINGS Source images reviewed. Patent antegrade flow seen within the carotid and vertebral arteries bilaterally on time-of-flight sequence. Visualized aortic arch of normal caliber with normal 3 vessel morphology. No flow-limiting stenosis about the origin of the great vessels. Visualized subclavian arteries widely patent bilaterally. Right common carotid artery tortuous proximally but widely patent to the bifurcation without stenosis. No significant atheromatous narrowing about the right bifurcation. Right ICA widely patent distally to the circle-of-Willis without stenosis or occlusion. Left common carotid artery  widely patent from its origin to the bifurcation. Minimal atheromatous irregularity about the left bifurcation without significant stenosis. Left ICA widely patent distally to the circle-of-Willis without stenosis or occlusion. Both of the vertebral arteries arise from the subclavian arteries. Mild to moderate stenoses seen at the origins of the vertebral arteries bilaterally (estimated 30-50%). Vertebral arteries otherwise widely patent within the neck without stenosis or occlusion. IMPRESSION: MRI HEAD IMPRESSION: 1. Acute ischemic cortical and subcortical nonhemorrhagic infarcts involving the right parieto-occipital region as above. No associated mass effect. 2. Additional chronic infarcts involving the right corona radiata and right frontal lobe. 3. Underlying mild chronic microvascular ischemic disease. MRA HEAD IMPRESSION: 1. Negative MRA for large vessel occlusion. 2. Focal severe stenosis involving the distal right M1 segment/right MCA bifurcation, with additional downstream severe distal right M2/M3 stenosis. 3. Focal moderate to severe distal left M1 stenosis. 4. Otherwise patent intracranial circulation. No other hemodynamically significant or correctable stenosis. MRA NECK IMPRESSION: 1. Wide patency of the common and internal carotid arteries bilaterally within the neck. 2. Mild to moderate atheromatous stenoses at the origin of the vertebral arteries bilaterally (estimated 30-50%). Vertebral arteries otherwise widely patent within the neck. Electronically Signed   By: Jeannine Boga M.D.   On: 02/08/2018 18:14   Mr Jodene Nam Neck W Wo Contrast  Result Date: 02/08/2018 CLINICAL DATA:  None. Initial evaluation for acute left-sided visual defect. EXAM: MRI HEAD WITHOUT CONTRAST MRA HEAD  WITHOUT CONTRAST MRA NECK WITHOUT AND WITH CONTRAST TECHNIQUE: Multiplanar, multiecho pulse sequences of the brain and surrounding structures were obtained without intravenous contrast. Angiographic images of the  Circle of Willis were obtained using MRA technique without intravenous contrast. Angiographic images of the neck were obtained using MRA technique without and with intravenous contrast. Carotid stenosis measurements (when applicable) are obtained utilizing NASCET criteria, using the distal internal carotid diameter as the denominator. CONTRAST:  7 cc of Gadavist. COMPARISON:  Prior CT from earlier the same day. FINDINGS: MRI HEAD FINDINGS Generalized age-related cerebral atrophy. Patchy T2/FLAIR hyperintensity within the periventricular white matter most like related chronic micro vessel ischemic disease, mild in nature. Remote lacunar infarcts seen involving the right corona radiata. Associated mild wall layering in degeneration at the right cerebral peduncle. Additional small right frontal cortical infarct. Patchy areas of cortical and subcortical restricted diffusion seen involving right parieto-occipital region, compatible with acute right posterior MCA and/or MCA/PCA watershed infarct. No associated hemorrhage or mass effect. No other evidence for acute or subacute ischemia. No foci of susceptibility artifact to suggest acute or chronic intracranial hemorrhage. No mass lesion, midline shift or mass effect. No hydrocephalus. No extra-axial fluid collection. Pituitary gland normal. Midline structures intact. Major intracranial vascular flow voids well maintained. Craniocervical junction normal. Upper cervical spine normal. Bone marrow signal intensity within normal limits. No scalp soft tissue abnormality. Globes and orbital soft tissues within normal limits. Paranasal sinuses are clear. No mastoid effusion. Inner ear structures grossly normal. MRA HEAD FINDINGS ANTERIOR CIRCULATION: Distal cervical segments of the internal carotid arteries are patent with symmetric antegrade flow. Petrous, cavernous, and supraclinoid segments widely patent without hemodynamically significant stenosis. A1 segments, anterior  communicating artery common anterior cerebral arteries widely patent. M1 segments patent proximally. There is focal severe stenosis involving the distal right M1 segment and right MCA bifurcation, extending into the proximal right M2 branches, most notable at the superior division (series 406, image 10). Additional severe distal right M2/M3 stenosis noted distally at the right superior division (series 406, image 15). Right MCA branches perfused distally. On the left, there is a focal moderate to severe stenosis involving the distal left M1 segment (series 406, image 12). Left MCA bifurcation widely patent distally as are the distal left MCA branches. POSTERIOR CIRCULATION: Vertebral arteries widely patent to the vertebrobasilar junction without stenosis. Left PICA patent. Right PICA not visualized. Basilar widely patent to its distal aspect without stenosis. Superior cerebellar arteries patent bilaterally. Both of the posterior cerebral arteries primarily supplied via the basilar. Mild scattered atheromatous irregularity within the PCAs bilaterally, right greater than left, without flow-limiting stenosis. PCAs are patent to their distal aspects. No intracranial aneurysm or other vascular abnormality. MRA NECK FINDINGS Source images reviewed. Patent antegrade flow seen within the carotid and vertebral arteries bilaterally on time-of-flight sequence. Visualized aortic arch of normal caliber with normal 3 vessel morphology. No flow-limiting stenosis about the origin of the great vessels. Visualized subclavian arteries widely patent bilaterally. Right common carotid artery tortuous proximally but widely patent to the bifurcation without stenosis. No significant atheromatous narrowing about the right bifurcation. Right ICA widely patent distally to the circle-of-Willis without stenosis or occlusion. Left common carotid artery widely patent from its origin to the bifurcation. Minimal atheromatous irregularity about the  left bifurcation without significant stenosis. Left ICA widely patent distally to the circle-of-Willis without stenosis or occlusion. Both of the vertebral arteries arise from the subclavian arteries. Mild to moderate stenoses seen at the origins of the  vertebral arteries bilaterally (estimated 30-50%). Vertebral arteries otherwise widely patent within the neck without stenosis or occlusion. IMPRESSION: MRI HEAD IMPRESSION: 1. Acute ischemic cortical and subcortical nonhemorrhagic infarcts involving the right parieto-occipital region as above. No associated mass effect. 2. Additional chronic infarcts involving the right corona radiata and right frontal lobe. 3. Underlying mild chronic microvascular ischemic disease. MRA HEAD IMPRESSION: 1. Negative MRA for large vessel occlusion. 2. Focal severe stenosis involving the distal right M1 segment/right MCA bifurcation, with additional downstream severe distal right M2/M3 stenosis. 3. Focal moderate to severe distal left M1 stenosis. 4. Otherwise patent intracranial circulation. No other hemodynamically significant or correctable stenosis. MRA NECK IMPRESSION: 1. Wide patency of the common and internal carotid arteries bilaterally within the neck. 2. Mild to moderate atheromatous stenoses at the origin of the vertebral arteries bilaterally (estimated 30-50%). Vertebral arteries otherwise widely patent within the neck. Electronically Signed   By: Jeannine Boga M.D.   On: 02/08/2018 18:14   Mr Brain Wo Contrast  Result Date: 02/08/2018 CLINICAL DATA:  None. Initial evaluation for acute left-sided visual defect. EXAM: MRI HEAD WITHOUT CONTRAST MRA HEAD WITHOUT CONTRAST MRA NECK WITHOUT AND WITH CONTRAST TECHNIQUE: Multiplanar, multiecho pulse sequences of the brain and surrounding structures were obtained without intravenous contrast. Angiographic images of the Circle of Willis were obtained using MRA technique without intravenous contrast. Angiographic images of  the neck were obtained using MRA technique without and with intravenous contrast. Carotid stenosis measurements (when applicable) are obtained utilizing NASCET criteria, using the distal internal carotid diameter as the denominator. CONTRAST:  7 cc of Gadavist. COMPARISON:  Prior CT from earlier the same day. FINDINGS: MRI HEAD FINDINGS Generalized age-related cerebral atrophy. Patchy T2/FLAIR hyperintensity within the periventricular white matter most like related chronic micro vessel ischemic disease, mild in nature. Remote lacunar infarcts seen involving the right corona radiata. Associated mild wall layering in degeneration at the right cerebral peduncle. Additional small right frontal cortical infarct. Patchy areas of cortical and subcortical restricted diffusion seen involving right parieto-occipital region, compatible with acute right posterior MCA and/or MCA/PCA watershed infarct. No associated hemorrhage or mass effect. No other evidence for acute or subacute ischemia. No foci of susceptibility artifact to suggest acute or chronic intracranial hemorrhage. No mass lesion, midline shift or mass effect. No hydrocephalus. No extra-axial fluid collection. Pituitary gland normal. Midline structures intact. Major intracranial vascular flow voids well maintained. Craniocervical junction normal. Upper cervical spine normal. Bone marrow signal intensity within normal limits. No scalp soft tissue abnormality. Globes and orbital soft tissues within normal limits. Paranasal sinuses are clear. No mastoid effusion. Inner ear structures grossly normal. MRA HEAD FINDINGS ANTERIOR CIRCULATION: Distal cervical segments of the internal carotid arteries are patent with symmetric antegrade flow. Petrous, cavernous, and supraclinoid segments widely patent without hemodynamically significant stenosis. A1 segments, anterior communicating artery common anterior cerebral arteries widely patent. M1 segments patent proximally. There is  focal severe stenosis involving the distal right M1 segment and right MCA bifurcation, extending into the proximal right M2 branches, most notable at the superior division (series 406, image 10). Additional severe distal right M2/M3 stenosis noted distally at the right superior division (series 406, image 15). Right MCA branches perfused distally. On the left, there is a focal moderate to severe stenosis involving the distal left M1 segment (series 406, image 12). Left MCA bifurcation widely patent distally as are the distal left MCA branches. POSTERIOR CIRCULATION: Vertebral arteries widely patent to the vertebrobasilar junction without stenosis. Left PICA patent.  Right PICA not visualized. Basilar widely patent to its distal aspect without stenosis. Superior cerebellar arteries patent bilaterally. Both of the posterior cerebral arteries primarily supplied via the basilar. Mild scattered atheromatous irregularity within the PCAs bilaterally, right greater than left, without flow-limiting stenosis. PCAs are patent to their distal aspects. No intracranial aneurysm or other vascular abnormality. MRA NECK FINDINGS Source images reviewed. Patent antegrade flow seen within the carotid and vertebral arteries bilaterally on time-of-flight sequence. Visualized aortic arch of normal caliber with normal 3 vessel morphology. No flow-limiting stenosis about the origin of the great vessels. Visualized subclavian arteries widely patent bilaterally. Right common carotid artery tortuous proximally but widely patent to the bifurcation without stenosis. No significant atheromatous narrowing about the right bifurcation. Right ICA widely patent distally to the circle-of-Willis without stenosis or occlusion. Left common carotid artery widely patent from its origin to the bifurcation. Minimal atheromatous irregularity about the left bifurcation without significant stenosis. Left ICA widely patent distally to the circle-of-Willis without  stenosis or occlusion. Both of the vertebral arteries arise from the subclavian arteries. Mild to moderate stenoses seen at the origins of the vertebral arteries bilaterally (estimated 30-50%). Vertebral arteries otherwise widely patent within the neck without stenosis or occlusion. IMPRESSION: MRI HEAD IMPRESSION: 1. Acute ischemic cortical and subcortical nonhemorrhagic infarcts involving the right parieto-occipital region as above. No associated mass effect. 2. Additional chronic infarcts involving the right corona radiata and right frontal lobe. 3. Underlying mild chronic microvascular ischemic disease. MRA HEAD IMPRESSION: 1. Negative MRA for large vessel occlusion. 2. Focal severe stenosis involving the distal right M1 segment/right MCA bifurcation, with additional downstream severe distal right M2/M3 stenosis. 3. Focal moderate to severe distal left M1 stenosis. 4. Otherwise patent intracranial circulation. No other hemodynamically significant or correctable stenosis. MRA NECK IMPRESSION: 1. Wide patency of the common and internal carotid arteries bilaterally within the neck. 2. Mild to moderate atheromatous stenoses at the origin of the vertebral arteries bilaterally (estimated 30-50%). Vertebral arteries otherwise widely patent within the neck. Electronically Signed   By: Jeannine Boga M.D.   On: 02/08/2018 18:14      TODAY-DAY OF DISCHARGE:  Subjective:   Anamarie Hunn today has no headache,no chest abdominal pain,no new weakness tingling or numbness, feels much better wants to go home today.  Her vision is essentially unchanged.  She has no other focal neurological deficits on exam.  Objective:   Blood pressure (!) 126/58, pulse 70, temperature 98.4 F (36.9 C), temperature source Oral, resp. rate 19, height 5' (1.524 m), weight 63.6 kg, SpO2 94 %.  Intake/Output Summary (Last 24 hours) at 02/10/2018 0915 Last data filed at 02/09/2018 1500 Gross per 24 hour  Intake 360 ml    Output -  Net 360 ml   Filed Weights   02/08/18 1122 02/08/18 1824  Weight: 67.6 kg 63.6 kg    Exam: Awake Alert, Oriented *3, No new F.N deficits, Normal affect [place].AT,PERRAL Supple Neck,No JVD, No cervical lymphadenopathy appriciated.  Symmetrical Chest wall movement, Good air movement bilaterally, CTAB RRR,No Gallops,Rubs or new Murmurs, No Parasternal Heave +ve B.Sounds, Abd Soft, Non tender, No organomegaly appriciated, No rebound -guarding or rigidity. No Cyanosis, Clubbing or edema, No new Rash or bruise   PERTINENT RADIOLOGIC STUDIES: Ct Head Wo Contrast  Result Date: 02/08/2018 CLINICAL DATA:  Visual field loss, ataxia EXAM: CT HEAD WITHOUT CONTRAST TECHNIQUE: Contiguous axial images were obtained from the base of the skull through the vertex without intravenous contrast. COMPARISON:  01/14/2017 FINDINGS:  Brain: Wedge-shaped right parietal hypodensity involving subcortical white matter and overlying cortex, images 18 through 23 compatible with a subacute evolving right parietal watershed infarct. No associated intracranial hemorrhage. Additional areas of chronic white matter microvascular change. No significant mass effect, midline shift, herniation or hydrocephalus. No extra-axial fluid collection. Cisterns are patent. Cerebellar atrophy as well. Vascular: No hyperdense vessel or unexpected calcification. Skull: Normal. Negative for fracture or focal lesion. Sinuses/Orbits: No acute finding. Other: None. IMPRESSION: Right parietal hypodensity compatible with an evolving subacute right parietal watershed infarct. Additional areas of chronic white matter microvascular ischemic change. These results were called by telephone at the time of interpretation on 02/08/2018 at 4:24 pm to Dr. Francia Greaves, who verbally acknowledged these results. Electronically Signed   By: Jerilynn Mages.  Shick M.D.   On: 02/08/2018 16:24   Mr Jodene Nam Head Wo Contrast  Result Date: 02/08/2018 CLINICAL DATA:  None. Initial  evaluation for acute left-sided visual defect. EXAM: MRI HEAD WITHOUT CONTRAST MRA HEAD WITHOUT CONTRAST MRA NECK WITHOUT AND WITH CONTRAST TECHNIQUE: Multiplanar, multiecho pulse sequences of the brain and surrounding structures were obtained without intravenous contrast. Angiographic images of the Circle of Willis were obtained using MRA technique without intravenous contrast. Angiographic images of the neck were obtained using MRA technique without and with intravenous contrast. Carotid stenosis measurements (when applicable) are obtained utilizing NASCET criteria, using the distal internal carotid diameter as the denominator. CONTRAST:  7 cc of Gadavist. COMPARISON:  Prior CT from earlier the same day. FINDINGS: MRI HEAD FINDINGS Generalized age-related cerebral atrophy. Patchy T2/FLAIR hyperintensity within the periventricular white matter most like related chronic micro vessel ischemic disease, mild in nature. Remote lacunar infarcts seen involving the right corona radiata. Associated mild wall layering in degeneration at the right cerebral peduncle. Additional small right frontal cortical infarct. Patchy areas of cortical and subcortical restricted diffusion seen involving right parieto-occipital region, compatible with acute right posterior MCA and/or MCA/PCA watershed infarct. No associated hemorrhage or mass effect. No other evidence for acute or subacute ischemia. No foci of susceptibility artifact to suggest acute or chronic intracranial hemorrhage. No mass lesion, midline shift or mass effect. No hydrocephalus. No extra-axial fluid collection. Pituitary gland normal. Midline structures intact. Major intracranial vascular flow voids well maintained. Craniocervical junction normal. Upper cervical spine normal. Bone marrow signal intensity within normal limits. No scalp soft tissue abnormality. Globes and orbital soft tissues within normal limits. Paranasal sinuses are clear. No mastoid effusion. Inner ear  structures grossly normal. MRA HEAD FINDINGS ANTERIOR CIRCULATION: Distal cervical segments of the internal carotid arteries are patent with symmetric antegrade flow. Petrous, cavernous, and supraclinoid segments widely patent without hemodynamically significant stenosis. A1 segments, anterior communicating artery common anterior cerebral arteries widely patent. M1 segments patent proximally. There is focal severe stenosis involving the distal right M1 segment and right MCA bifurcation, extending into the proximal right M2 branches, most notable at the superior division (series 406, image 10). Additional severe distal right M2/M3 stenosis noted distally at the right superior division (series 406, image 15). Right MCA branches perfused distally. On the left, there is a focal moderate to severe stenosis involving the distal left M1 segment (series 406, image 12). Left MCA bifurcation widely patent distally as are the distal left MCA branches. POSTERIOR CIRCULATION: Vertebral arteries widely patent to the vertebrobasilar junction without stenosis. Left PICA patent. Right PICA not visualized. Basilar widely patent to its distal aspect without stenosis. Superior cerebellar arteries patent bilaterally. Both of the posterior cerebral arteries primarily supplied via  the basilar. Mild scattered atheromatous irregularity within the PCAs bilaterally, right greater than left, without flow-limiting stenosis. PCAs are patent to their distal aspects. No intracranial aneurysm or other vascular abnormality. MRA NECK FINDINGS Source images reviewed. Patent antegrade flow seen within the carotid and vertebral arteries bilaterally on time-of-flight sequence. Visualized aortic arch of normal caliber with normal 3 vessel morphology. No flow-limiting stenosis about the origin of the great vessels. Visualized subclavian arteries widely patent bilaterally. Right common carotid artery tortuous proximally but widely patent to the bifurcation  without stenosis. No significant atheromatous narrowing about the right bifurcation. Right ICA widely patent distally to the circle-of-Willis without stenosis or occlusion. Left common carotid artery widely patent from its origin to the bifurcation. Minimal atheromatous irregularity about the left bifurcation without significant stenosis. Left ICA widely patent distally to the circle-of-Willis without stenosis or occlusion. Both of the vertebral arteries arise from the subclavian arteries. Mild to moderate stenoses seen at the origins of the vertebral arteries bilaterally (estimated 30-50%). Vertebral arteries otherwise widely patent within the neck without stenosis or occlusion. IMPRESSION: MRI HEAD IMPRESSION: 1. Acute ischemic cortical and subcortical nonhemorrhagic infarcts involving the right parieto-occipital region as above. No associated mass effect. 2. Additional chronic infarcts involving the right corona radiata and right frontal lobe. 3. Underlying mild chronic microvascular ischemic disease. MRA HEAD IMPRESSION: 1. Negative MRA for large vessel occlusion. 2. Focal severe stenosis involving the distal right M1 segment/right MCA bifurcation, with additional downstream severe distal right M2/M3 stenosis. 3. Focal moderate to severe distal left M1 stenosis. 4. Otherwise patent intracranial circulation. No other hemodynamically significant or correctable stenosis. MRA NECK IMPRESSION: 1. Wide patency of the common and internal carotid arteries bilaterally within the neck. 2. Mild to moderate atheromatous stenoses at the origin of the vertebral arteries bilaterally (estimated 30-50%). Vertebral arteries otherwise widely patent within the neck. Electronically Signed   By: Jeannine Boga M.D.   On: 02/08/2018 18:14   Mr Jodene Nam Neck W Wo Contrast  Result Date: 02/08/2018 CLINICAL DATA:  None. Initial evaluation for acute left-sided visual defect. EXAM: MRI HEAD WITHOUT CONTRAST MRA HEAD WITHOUT CONTRAST  MRA NECK WITHOUT AND WITH CONTRAST TECHNIQUE: Multiplanar, multiecho pulse sequences of the brain and surrounding structures were obtained without intravenous contrast. Angiographic images of the Circle of Willis were obtained using MRA technique without intravenous contrast. Angiographic images of the neck were obtained using MRA technique without and with intravenous contrast. Carotid stenosis measurements (when applicable) are obtained utilizing NASCET criteria, using the distal internal carotid diameter as the denominator. CONTRAST:  7 cc of Gadavist. COMPARISON:  Prior CT from earlier the same day. FINDINGS: MRI HEAD FINDINGS Generalized age-related cerebral atrophy. Patchy T2/FLAIR hyperintensity within the periventricular white matter most like related chronic micro vessel ischemic disease, mild in nature. Remote lacunar infarcts seen involving the right corona radiata. Associated mild wall layering in degeneration at the right cerebral peduncle. Additional small right frontal cortical infarct. Patchy areas of cortical and subcortical restricted diffusion seen involving right parieto-occipital region, compatible with acute right posterior MCA and/or MCA/PCA watershed infarct. No associated hemorrhage or mass effect. No other evidence for acute or subacute ischemia. No foci of susceptibility artifact to suggest acute or chronic intracranial hemorrhage. No mass lesion, midline shift or mass effect. No hydrocephalus. No extra-axial fluid collection. Pituitary gland normal. Midline structures intact. Major intracranial vascular flow voids well maintained. Craniocervical junction normal. Upper cervical spine normal. Bone marrow signal intensity within normal limits. No scalp soft tissue abnormality.  Globes and orbital soft tissues within normal limits. Paranasal sinuses are clear. No mastoid effusion. Inner ear structures grossly normal. MRA HEAD FINDINGS ANTERIOR CIRCULATION: Distal cervical segments of the  internal carotid arteries are patent with symmetric antegrade flow. Petrous, cavernous, and supraclinoid segments widely patent without hemodynamically significant stenosis. A1 segments, anterior communicating artery common anterior cerebral arteries widely patent. M1 segments patent proximally. There is focal severe stenosis involving the distal right M1 segment and right MCA bifurcation, extending into the proximal right M2 branches, most notable at the superior division (series 406, image 10). Additional severe distal right M2/M3 stenosis noted distally at the right superior division (series 406, image 15). Right MCA branches perfused distally. On the left, there is a focal moderate to severe stenosis involving the distal left M1 segment (series 406, image 12). Left MCA bifurcation widely patent distally as are the distal left MCA branches. POSTERIOR CIRCULATION: Vertebral arteries widely patent to the vertebrobasilar junction without stenosis. Left PICA patent. Right PICA not visualized. Basilar widely patent to its distal aspect without stenosis. Superior cerebellar arteries patent bilaterally. Both of the posterior cerebral arteries primarily supplied via the basilar. Mild scattered atheromatous irregularity within the PCAs bilaterally, right greater than left, without flow-limiting stenosis. PCAs are patent to their distal aspects. No intracranial aneurysm or other vascular abnormality. MRA NECK FINDINGS Source images reviewed. Patent antegrade flow seen within the carotid and vertebral arteries bilaterally on time-of-flight sequence. Visualized aortic arch of normal caliber with normal 3 vessel morphology. No flow-limiting stenosis about the origin of the great vessels. Visualized subclavian arteries widely patent bilaterally. Right common carotid artery tortuous proximally but widely patent to the bifurcation without stenosis. No significant atheromatous narrowing about the right bifurcation. Right ICA  widely patent distally to the circle-of-Willis without stenosis or occlusion. Left common carotid artery widely patent from its origin to the bifurcation. Minimal atheromatous irregularity about the left bifurcation without significant stenosis. Left ICA widely patent distally to the circle-of-Willis without stenosis or occlusion. Both of the vertebral arteries arise from the subclavian arteries. Mild to moderate stenoses seen at the origins of the vertebral arteries bilaterally (estimated 30-50%). Vertebral arteries otherwise widely patent within the neck without stenosis or occlusion. IMPRESSION: MRI HEAD IMPRESSION: 1. Acute ischemic cortical and subcortical nonhemorrhagic infarcts involving the right parieto-occipital region as above. No associated mass effect. 2. Additional chronic infarcts involving the right corona radiata and right frontal lobe. 3. Underlying mild chronic microvascular ischemic disease. MRA HEAD IMPRESSION: 1. Negative MRA for large vessel occlusion. 2. Focal severe stenosis involving the distal right M1 segment/right MCA bifurcation, with additional downstream severe distal right M2/M3 stenosis. 3. Focal moderate to severe distal left M1 stenosis. 4. Otherwise patent intracranial circulation. No other hemodynamically significant or correctable stenosis. MRA NECK IMPRESSION: 1. Wide patency of the common and internal carotid arteries bilaterally within the neck. 2. Mild to moderate atheromatous stenoses at the origin of the vertebral arteries bilaterally (estimated 30-50%). Vertebral arteries otherwise widely patent within the neck. Electronically Signed   By: Jeannine Boga M.D.   On: 02/08/2018 18:14   Mr Brain Wo Contrast  Result Date: 02/08/2018 CLINICAL DATA:  None. Initial evaluation for acute left-sided visual defect. EXAM: MRI HEAD WITHOUT CONTRAST MRA HEAD WITHOUT CONTRAST MRA NECK WITHOUT AND WITH CONTRAST TECHNIQUE: Multiplanar, multiecho pulse sequences of the brain and  surrounding structures were obtained without intravenous contrast. Angiographic images of the Circle of Willis were obtained using MRA technique without intravenous contrast. Angiographic images of  the neck were obtained using MRA technique without and with intravenous contrast. Carotid stenosis measurements (when applicable) are obtained utilizing NASCET criteria, using the distal internal carotid diameter as the denominator. CONTRAST:  7 cc of Gadavist. COMPARISON:  Prior CT from earlier the same day. FINDINGS: MRI HEAD FINDINGS Generalized age-related cerebral atrophy. Patchy T2/FLAIR hyperintensity within the periventricular white matter most like related chronic micro vessel ischemic disease, mild in nature. Remote lacunar infarcts seen involving the right corona radiata. Associated mild wall layering in degeneration at the right cerebral peduncle. Additional small right frontal cortical infarct. Patchy areas of cortical and subcortical restricted diffusion seen involving right parieto-occipital region, compatible with acute right posterior MCA and/or MCA/PCA watershed infarct. No associated hemorrhage or mass effect. No other evidence for acute or subacute ischemia. No foci of susceptibility artifact to suggest acute or chronic intracranial hemorrhage. No mass lesion, midline shift or mass effect. No hydrocephalus. No extra-axial fluid collection. Pituitary gland normal. Midline structures intact. Major intracranial vascular flow voids well maintained. Craniocervical junction normal. Upper cervical spine normal. Bone marrow signal intensity within normal limits. No scalp soft tissue abnormality. Globes and orbital soft tissues within normal limits. Paranasal sinuses are clear. No mastoid effusion. Inner ear structures grossly normal. MRA HEAD FINDINGS ANTERIOR CIRCULATION: Distal cervical segments of the internal carotid arteries are patent with symmetric antegrade flow. Petrous, cavernous, and supraclinoid  segments widely patent without hemodynamically significant stenosis. A1 segments, anterior communicating artery common anterior cerebral arteries widely patent. M1 segments patent proximally. There is focal severe stenosis involving the distal right M1 segment and right MCA bifurcation, extending into the proximal right M2 branches, most notable at the superior division (series 406, image 10). Additional severe distal right M2/M3 stenosis noted distally at the right superior division (series 406, image 15). Right MCA branches perfused distally. On the left, there is a focal moderate to severe stenosis involving the distal left M1 segment (series 406, image 12). Left MCA bifurcation widely patent distally as are the distal left MCA branches. POSTERIOR CIRCULATION: Vertebral arteries widely patent to the vertebrobasilar junction without stenosis. Left PICA patent. Right PICA not visualized. Basilar widely patent to its distal aspect without stenosis. Superior cerebellar arteries patent bilaterally. Both of the posterior cerebral arteries primarily supplied via the basilar. Mild scattered atheromatous irregularity within the PCAs bilaterally, right greater than left, without flow-limiting stenosis. PCAs are patent to their distal aspects. No intracranial aneurysm or other vascular abnormality. MRA NECK FINDINGS Source images reviewed. Patent antegrade flow seen within the carotid and vertebral arteries bilaterally on time-of-flight sequence. Visualized aortic arch of normal caliber with normal 3 vessel morphology. No flow-limiting stenosis about the origin of the great vessels. Visualized subclavian arteries widely patent bilaterally. Right common carotid artery tortuous proximally but widely patent to the bifurcation without stenosis. No significant atheromatous narrowing about the right bifurcation. Right ICA widely patent distally to the circle-of-Willis without stenosis or occlusion. Left common carotid artery  widely patent from its origin to the bifurcation. Minimal atheromatous irregularity about the left bifurcation without significant stenosis. Left ICA widely patent distally to the circle-of-Willis without stenosis or occlusion. Both of the vertebral arteries arise from the subclavian arteries. Mild to moderate stenoses seen at the origins of the vertebral arteries bilaterally (estimated 30-50%). Vertebral arteries otherwise widely patent within the neck without stenosis or occlusion. IMPRESSION: MRI HEAD IMPRESSION: 1. Acute ischemic cortical and subcortical nonhemorrhagic infarcts involving the right parieto-occipital region as above. No associated mass effect. 2. Additional chronic  infarcts involving the right corona radiata and right frontal lobe. 3. Underlying mild chronic microvascular ischemic disease. MRA HEAD IMPRESSION: 1. Negative MRA for large vessel occlusion. 2. Focal severe stenosis involving the distal right M1 segment/right MCA bifurcation, with additional downstream severe distal right M2/M3 stenosis. 3. Focal moderate to severe distal left M1 stenosis. 4. Otherwise patent intracranial circulation. No other hemodynamically significant or correctable stenosis. MRA NECK IMPRESSION: 1. Wide patency of the common and internal carotid arteries bilaterally within the neck. 2. Mild to moderate atheromatous stenoses at the origin of the vertebral arteries bilaterally (estimated 30-50%). Vertebral arteries otherwise widely patent within the neck. Electronically Signed   By: Jeannine Boga M.D.   On: 02/08/2018 18:14     PERTINENT LAB RESULTS: CBC: Recent Labs    02/08/18 1143  WBC 7.4  HGB 12.5  HCT 37.7  PLT 323   CMET CMP     Component Value Date/Time   NA 139 02/08/2018 1143   K 3.6 02/08/2018 1143   CL 107 02/08/2018 1143   CO2 23 02/08/2018 1143   GLUCOSE 263 (H) 02/08/2018 1143   BUN 14 02/08/2018 1143   CREATININE 0.54 02/08/2018 1143   CALCIUM 9.2 02/08/2018 1143    PROT 6.5 02/08/2018 1143   ALBUMIN 2.8 (L) 02/08/2018 1143   AST 14 (L) 02/08/2018 1143   ALT 10 02/08/2018 1143   ALKPHOS 74 02/08/2018 1143   BILITOT 0.4 02/08/2018 1143   GFRNONAA >60 02/08/2018 1143   GFRAA >60 02/08/2018 1143    GFR Estimated Creatinine Clearance: 54.4 mL/min (by C-G formula based on SCr of 0.54 mg/dL). No results for input(s): LIPASE, AMYLASE in the last 72 hours. No results for input(s): CKTOTAL, CKMB, CKMBINDEX, TROPONINI in the last 72 hours. Invalid input(s): POCBNP No results for input(s): DDIMER in the last 72 hours. No results for input(s): HGBA1C in the last 72 hours. Recent Labs    02/09/18 0413  CHOL 152  HDL 49  LDLCALC 76  TRIG 134  CHOLHDL 3.1   No results for input(s): TSH, T4TOTAL, T3FREE, THYROIDAB in the last 72 hours.  Invalid input(s): FREET3 No results for input(s): VITAMINB12, FOLATE, FERRITIN, TIBC, IRON, RETICCTPCT in the last 72 hours. Coags: No results for input(s): INR in the last 72 hours.  Invalid input(s): PT Microbiology: No results found for this or any previous visit (from the past 240 hour(s)).  FURTHER DISCHARGE INSTRUCTIONS:  Get Medicines reviewed and adjusted: Please take all your medications with you for your next visit with your Primary MD  Laboratory/radiological data: Please request your Primary MD to go over all hospital tests and procedure/radiological results at the follow up, please ask your Primary MD to get all Hospital records sent to his/her office.  In some cases, they will be blood work, cultures and biopsy results pending at the time of your discharge. Please request that your primary care M.D. goes through all the records of your hospital data and follows up on these results.  Also Note the following: If you experience worsening of your admission symptoms, develop shortness of breath, life threatening emergency, suicidal or homicidal thoughts you must seek medical attention immediately by  calling 911 or calling your MD immediately  if symptoms less severe.  You must read complete instructions/literature along with all the possible adverse reactions/side effects for all the Medicines you take and that have been prescribed to you. Take any new Medicines after you have completely understood and accpet all the possible adverse  reactions/side effects.   Do not drive when taking Pain medications or sleeping medications (Benzodaizepines)  Do not take more than prescribed Pain, Sleep and Anxiety Medications. It is not advisable to combine anxiety,sleep and pain medications without talking with your primary care practitioner  Special Instructions: If you have smoked or chewed Tobacco  in the last 2 yrs please stop smoking, stop any regular Alcohol  and or any Recreational drug use.  Wear Seat belts while driving.  Please note: You were cared for by a hospitalist during your hospital stay. Once you are discharged, your primary care physician will handle any further medical issues. Please note that NO REFILLS for any discharge medications will be authorized once you are discharged, as it is imperative that you return to your primary care physician (or establish a relationship with a primary care physician if you do not have one) for your post hospital discharge needs so that they can reassess your need for medications and monitor your lab values.  Total Time spent coordinating discharge including counseling, education and face to face time equals 35 minutes  Signed: Alizee Maple 02/10/2018 9:15 AM

## 2018-02-10 NOTE — Consult Note (Signed)
   [facility] CM Inpatient Consult   02/10/2018  Sarah [name] 04/26/1945 [id number]   Follow up:  Met with the patient at the bedside.  Patient's chart reviewed and patient will be followed with EMMI stroke.  Patient introduced to care management with [facility].  Patient agreeable to post hospital EMMI follow up and plans for rehab on outpatient basis.  Consent form signed and copy given with folder with information, 24 hour nurse advise line explained.   Natividad Brood, RN BSN [facility] Liaison  [phone] business mobile phone Toll free office [phone]

## 2018-02-10 NOTE — Progress Notes (Signed)
Sarah [name] to be D/C'd home per MD order. Discussed with the patient and all questions fully answered.   VVS, Skin clean, dry and intact without evidence of skin break down, no evidence of skin tears noted.  IV catheter discontinued intact. Site without signs and symptoms of complications. Dressing and pressure applied.  An After Visit Summary was printed and given to the patient.  Patient escorted via [place], and D/C home via private auto.  Tama High  02/10/2018 1:48 PM

## 2018-02-10 NOTE — Care Management Important Message (Signed)
Important Message  Patient Details  Name: Sarah [name] MRN: [id number] Date of Birth: 08-Nov-1945   Medicare Important Message Given:  Yes    Orbie Pyo 02/10/2018, 3:13 PM

## 2018-02-10 NOTE — Care Management Note (Signed)
Case Management Note  Patient Details  Name: Sarah [name] MRN: [id number] Date of Birth: Oct 01, 1945  Subjective/Objective:    CVA                Action/Plan: NCM spoke to pt and states she has been for to Neuro Rehab in the past. Sent referral to Neuro Outpt Rehab and explained to pt that rehab will call to arrange appt. Contacted [facility] for Rollator and [facility] states pt received a Conservation officer, nature with seat on last year. Pt will go by  Expected Discharge Date:  02/10/18               Expected Discharge Plan:  OP Rehab  In-House Referral:  NA  Discharge planning Services  CM Consult  Post Acute Care Choice:  NA Choice offered to:  NA  DME Arranged:  N/A DME Agency:  NA  HH Arranged:  NA HH Agency:  NA  Status of Service:  Completed, signed off  If discussed at [place] of Stay Meetings, dates discussed:    Additional Comments:  Erenest Rasher, RN 02/10/2018, 12:34 PM

## 2018-02-10 NOTE — Progress Notes (Signed)
Physical Therapy Treatment Patient Details Name: Sarah [name] MRN: [id number] DOB: August 09, 1945 Today's Date: 02/10/2018    History of Present Illness Pt is a 73 y.o. female admitted 02/08/18 with visual field deficit. MRI showed acute nonhemorrhagic infarcts involving R parieto-occipital region; chronic infarcts in R corona radiata. PMH includes CVA (2018), HTN, DM, HLD.    PT Comments    Pt performed gait training without AD, she presents with modest instability that would be resolved with a youth height RW.  Informed nursing and case management of need for youth RW at d/c.  Plan remains appropriate for outpatient neuro PT at d/c.  Pt is agreeable to follow up with Outpatient neuro PT.      Follow Up Recommendations  Outpatient PT;Supervision for mobility/OOB(neuro)     Equipment Recommendations  Rolling walker with 5" wheels(youth height)    Recommendations for Other Services       Precautions / Restrictions Precautions Precautions: Fall Precaution Comments: L visual field deficit Restrictions Weight Bearing Restrictions: No    Mobility  Bed Mobility Overal bed mobility: Independent                Transfers Overall transfer level: Needs assistance Equipment used: None Transfers: Sit to/from Stand Sit to Stand: Supervision         General transfer comment: Supervision for safety.    Ambulation/Gait Ambulation/Gait assistance: Min guard Gait Distance (Feet): 300 Feet Assistive device: None Gait Pattern/deviations: Step-through pattern;Decreased stride length Gait velocity: Decreased   General Gait Details: Minor LOB with min guard to assist.  Pt is slow and guarded with instability.  She would benefit from use of RW until balance improves.     Stairs Stairs: Yes Stairs assistance: Min guard Stair Management: Two rails Number of Stairs: 10 General stair comments: min guard for safety.     Wheelchair Mobility    Modified Rankin (Stroke  Patients Only) Modified Rankin (Stroke Patients Only) Pre-Morbid Rankin Score: Slight disability Modified Rankin: Moderately severe disability     Balance Overall balance assessment: Needs assistance Sitting-balance support: No upper extremity supported;Feet supported Sitting balance-Leahy Scale: Good       Standing balance-Leahy Scale: Poor Standing balance comment: mild LOB with min guard to correct.                              Cognition Arousal/Alertness: Awake/alert Behavior During Therapy: WFL for tasks assessed/performed Overall Cognitive Status: Within Functional Limits for tasks assessed                                        Exercises General Exercises - Lower Extremity Ankle Circles/Pumps: AROM;Both;20 reps;Supine Quad Sets: AROM;Both;10 reps;Supine Long Arc Quad: AROM;Both;10 reps;Supine Heel Slides: AROM;Both;10 reps;Supine Hip ABduction/ADduction: AROM;Both;10 reps;Supine Straight Leg Raises: AROM;Both;10 reps;Supine;Limitations Straight Leg Raises Limitations: extensor lag on L side. Hip Flexion/Marching: AROM;Both;10 reps;Seated    General Comments        Pertinent Vitals/Pain Pain Assessment: No/denies pain    Home Living                      Prior Function            PT Goals (current goals can now be found in the care plan section) Acute Rehab PT Goals Patient Stated Goal: Return home; improved vision Potential to  Achieve Goals: Good Progress towards PT goals: Progressing toward goals    Frequency    Min 4X/week      PT Plan Current plan remains appropriate    Co-evaluation              AM-PAC PT "6 Clicks" Mobility   Outcome Measure  Help needed turning from your back to your side while in a flat bed without using bedrails?: None Help needed moving from lying on your back to sitting on the side of a flat bed without using bedrails?: None Help needed moving to and from a bed to a chair  (including a wheelchair)?: A Little Help needed standing up from a chair using your arms (e.g., wheelchair or bedside chair)?: A Little Help needed to walk in hospital room?: A Little Help needed climbing 3-5 steps with a railing? : A Little 6 Click Score: 20    End of Session Equipment Utilized During Treatment: Gait belt Activity Tolerance: Patient tolerated treatment well Patient left: in chair;with call bell/phone within reach Nurse Communication: Mobility status PT Visit Diagnosis: Other abnormalities of gait and mobility (R26.89)     Time: 1001-1027 PT Time Calculation (min) (ACUTE ONLY): 26 min  Charges:  $Gait Training: 8-22 mins $Therapeutic Exercise: 8-22 mins                     Governor Rooks, PTA Acute Rehabilitation Services Pager [phone] Office [phone]     Ziasia Lenoir Eli Hose 02/10/2018, 10:36 AM

## 2018-02-13 ENCOUNTER — Ambulatory Visit: Payer: Medicare Other | Admitting: Registered"

## 2018-02-13 ENCOUNTER — Other Ambulatory Visit: Payer: Self-pay | Admitting: *Deleted

## 2018-02-13 NOTE — Patient Outreach (Signed)
Sarah [name]) Care Management  02/13/2018  Sarah [name] 11-14-1945 [id number]   EMMI-stroke  RED ON EMMI ALERT Day # 1 Date: Saturday 02/11/2018 [place] Reason:  Scheduled a follow-up appointment? No Been able to take every dose of meds? No  Insurance: united health care medicare   [facility] admissions x 1 ED visits x 1 in the last 6 months  Last admission for left homonymous hemianopia 02/08/2018 to 02/10/2018 at [facility] attempt # 1 successful at the home number  Patient is able to verify HIPAA [place] Management RN reviewed and addressed red alert with patient pt reports she had not taken all of her medication for the day at the time of the EMMI call She reports she should have answered Yes to question vs no  She confirms she did have a f/u appointment today 02/13/2018 and one scheduled on 02/14/2018 She reports at the time of the EMMI call the answer was correct but not the answer would be yes she has all f/u appointments scheduled except a pending neuro rehab program CM discussed the possible delay in neuro rehab She voiced understanding Cm encouraged her to contact CM is she has not heard from neuro rehab this week as her after summary does state she was referred to neuro rehab and is rehab staff will call to arrange her appointment Social: Ms Tokar lives alone but has support of her family including her sister. She is independent with her care needs and denies concerns with transportation to medical appointments   Conditions: left homonymous hemianopia, HTN<, type 2 DM, ankle fx, osteopenia, right index trigger finger, HDL, Anemia, vitamin D deficiency, tobacco use    Medications: She denies concerns with taking medications as prescribed, affording medications, side effects of medications and questions about medications   Appointments:  02/14/2018 primary MD appointment 02/15/2018 pacemaker check remotely   Advance Directives: She Denies need for  assist with advance directives    Consent: [facility] RN CM reviewed [facility] services with patient. Patient gave verbal consent for services.   Advised patient that there will be further automated EMMI- post discharge calls to assess how the patient is doing following the recent hospitalization Advised the patient that another call may be received from a nurse if any of their responses were abnormal. Patient voiced understanding and was appreciative of f/u call.   Plan: [facility] RN CM will close case at this time as patient has been assessed and no needs identified/needs resolved.   Pt encouraged to return a call to [facility] RN CM prn especially if she did not hear from neuro rehab staff this week   [facility] RN CM sent a successful outreach letter as discussed with [facility] brochure enclosed for review  Kimberly L. [name], RN, BSN, [place] Coordinator Office number [phone] Mobile number [phone]  Main [facility] number [phone] Fax number [phone]

## 2018-02-14 ENCOUNTER — Telehealth: Payer: Self-pay | Admitting: Family Medicine

## 2018-02-14 ENCOUNTER — Encounter: Payer: Self-pay | Admitting: Family Medicine

## 2018-02-14 ENCOUNTER — Ambulatory Visit (INDEPENDENT_AMBULATORY_CARE_PROVIDER_SITE_OTHER): Payer: Medicare Other | Admitting: Family Medicine

## 2018-02-14 VITALS — BP 120/72 | HR 86 | Temp 98.4°F | Resp 12 | Ht 60.0 in | Wt 144.4 lb

## 2018-02-14 DIAGNOSIS — I10 Essential (primary) hypertension: Secondary | ICD-10-CM

## 2018-02-14 DIAGNOSIS — E1121 Type 2 diabetes mellitus with diabetic nephropathy: Secondary | ICD-10-CM | POA: Diagnosis not present

## 2018-02-14 DIAGNOSIS — E785 Hyperlipidemia, unspecified: Secondary | ICD-10-CM

## 2018-02-14 DIAGNOSIS — R2681 Unsteadiness on feet: Secondary | ICD-10-CM

## 2018-02-14 DIAGNOSIS — H53462 Homonymous bilateral field defects, left side: Secondary | ICD-10-CM | POA: Diagnosis not present

## 2018-02-14 LAB — CBC
HCT: 34.8 % — ABNORMAL LOW (ref 36.0–46.0)
Hemoglobin: 11.7 g/dL — ABNORMAL LOW (ref 12.0–15.0)
MCHC: 33.5 g/dL (ref 30.0–36.0)
MCV: 92.8 fl (ref 78.0–100.0)
Platelets: 377 10*3/uL (ref 150.0–400.0)
RBC: 3.75 Mil/uL — ABNORMAL LOW (ref 3.87–5.11)
RDW: 13.4 % (ref 11.5–15.5)
WBC: 6.3 10*3/uL (ref 4.0–10.5)

## 2018-02-14 LAB — BASIC METABOLIC PANEL
BUN: 16 mg/dL (ref 6–23)
CO2: 28 mEq/L (ref 19–32)
Calcium: 9.6 mg/dL (ref 8.4–10.5)
Chloride: 107 mEq/L (ref 96–112)
Creatinine, Ser: 0.53 mg/dL (ref 0.40–1.20)
GFR: 137.78 mL/min (ref >60.00)
Glucose, Bld: 93 mg/dL (ref 70–99)
Potassium: 4.2 mEq/L (ref 3.5–5.1)
Sodium: 141 mEq/L (ref 135–145)

## 2018-02-14 NOTE — Progress Notes (Signed)
HPI:   Sarah [name] is a 73 y.o. female, who is here today to follow on recent hospitalization.  She was admitted on 02/08/2018 and discharged on 02/10/2018. She was referred from her ophthalmologist office to the ER because left homonymous hemianopsia. Brain MRI showed severe focal stenosis involving the distal right M1 segment.  Acute ischemic cortical and subcortical infarction in the right parieto-occipital region. 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  She was advised not to drive,her sister is driving her.  She has not started PT, appt pending.  She is supposed to follow in 3 months to re-evaluate vision.  She needs a Rx for a walker roller  Brain MRI:  1. Acute ischemic cortical and subcortical nonhemorrhagic infarcts involving the right parieto-occipital region as above. No associated mass effect. 2. Additional chronic infarcts involving the right corona radiata and right frontal lobe. 3. Underlying mild chronic microvascular ischemic disease.  DM 2: Lab Results  Component Value Date   HGBA1C 13.5 (H) 12/28/2017   Denies abdominal pain, nausea,vomiting, polydipsia,polyuria, or polyphagia.  BS's 150's. She is Lantus 60 U daily and Victoza 1.8 mg daily. Negative for hypoglycemia.   Hypertension: Denies severe/frequent headache, visual changes, chest pain, dyspnea, palpitation, claudication, new focal weakness, or edema. Home BP's "good." She is on Lisinopril 10 mg daily and Amlodipine 5 mg daily.  Component     Latest Ref Rng & Units 02/08/2018          Sodium     135 - 145 mEq/L 139  Potassium     3.5 - 5.1 mEq/L 3.6  Chloride     96 - 112 mEq/L 107  CO2     19 - 32 mEq/L 23  Glucose     70 - 99 mg/dL 263 (H)  BUN     6 - 23 mg/dL 14  Creatinine     0.40 - 1.20 mg/dL 0.54  Calcium     8.4 - 10.5 mg/dL 9.2  Total Protein     6.5 - 8.1 g/dL 6.5  Albumin     3.5 - 5.0 g/dL 2.8 (L)  AST     15 - 41  U/L 14 (L)  ALT     0 - 44 U/L 10  Alkaline Phosphatase     38 - 126 U/L 74  Total Bilirubin     0.3 - 1.2 mg/dL 0.4  GFR, Est Non African American     >60 mL/min >60  GFR, Est African American     >60 mL/min >60  Anion gap     5 - 15 9    HLD on Atorvastatin 80 mg daily. Tolerating medication well.  She has not been complaint with pharmacologic treatment nor with following dietary recommendations. She is trying to do better.  Lab Results  Component Value Date   CHOL 152 02/09/2018   HDL 49 02/09/2018   LDLCALC 76 02/09/2018   TRIG 134 02/09/2018   CHOLHDL 3.1 02/09/2018    Review of Systems  Constitutional: Positive for activity change and fatigue. Negative for appetite change and fever.  HENT: Negative for mouth sores, nosebleeds and trouble swallowing.   Eyes: Negative for redness and visual disturbance.  Respiratory: Negative for cough, shortness of breath and wheezing.   Cardiovascular: Negative for chest pain, palpitations and leg swelling.  Gastrointestinal: Negative for abdominal pain, nausea and vomiting.       Negative for changes  in bowel habits.  Endocrine: Negative for polydipsia, polyphagia and polyuria.  Genitourinary: Negative for decreased urine volume, dysuria and hematuria.  Musculoskeletal: Positive for gait problem. Negative for myalgias.  Skin: Negative for rash and wound.  Neurological: Positive for headaches (Occasionally.). Negative for syncope and weakness.  Psychiatric/Behavioral: Negative for confusion. The patient is nervous/anxious.       Current Outpatient Medications on File Prior to Visit  Medication Sig Dispense Refill  . acetaminophen (TYLENOL) 500 MG tablet Take 1,000 mg by mouth every 6 (six) hours as needed for mild pain or headache.    Marland Kitchen amLODipine (NORVASC) 5 MG tablet TAKE 1 TABLET(5 MG) BY MOUTH DAILY (Patient taking differently: Take 5 mg by mouth daily. ) 90 tablet 2  . aspirin 325 MG tablet Take 1 tablet (325 mg total)  by mouth daily. 90 tablet 0  . atorvastatin (LIPITOR) 80 MG tablet Take 1 tablet (80 mg total) by mouth daily at 6 PM. 30 tablet 0  . Blood Glucose Calibration (OT ULTRA/FASTTK CNTRL SOLN) SOLN     . calcium carbonate (TUMS EX) 750 MG chewable tablet Chew 1 tablet by mouth as needed for heartburn.    . clopidogrel (PLAVIX) 75 MG tablet TAKE 1 TABLET BY MOUTH DAILY (Patient taking differently: Take 75 mg by mouth daily. ) 90 tablet 2  . Insulin Glargine (LANTUS) 100 UNIT/ML Solostar Pen Inject 60 Units into the skin daily. 15 mL 3  . Insulin Pen Needle (BD PEN NEEDLE NANO U/F) 32G X 4 MM MISC USE UP TO FOUR TIMES DAILY 200 each 2  . Lancet Devices (SIMPLE DIAGNOSTICS LANCING DEV) MISC Check BS before and 2 hours after meals. 200 each 6  . liraglutide (VICTOZA) 18 MG/3ML SOPN 1.8 mcg daily. 3 pen 3  . lisinopril (PRINIVIL,ZESTRIL) 10 MG tablet Take 1 tablet (10 mg total) by mouth daily. 90 tablet 2  . ONE TOUCH ULTRA TEST test strip Check BS before and 2 hours after meals. 200 each 6  . PHARMACIST CHOICE ALCOHOL 70 % PADS     . ROCKLATAN 0.02-0.005 % SOLN Place 1 drop into both eyes every evening.    . TRAVATAN Z 0.004 % SOLN ophthalmic solution Place 1 drop into both eyes at bedtime.  11  . Vitamin D, Ergocalciferol, (DRISDOL) 1.25 MG (50000 UT) CAPS capsule TAKE ONE CAPSULE BY MOUTH TWICE A WEEK ON MONDAY AND THURSDAY (Patient taking differently: Take 50,000 Units by mouth 2 (two) times a week. TAKE ONE CAPSULE BY MOUTH TWICE A WEEK ON Tuesday AND THURSDAY) 8 capsule 0   No current facility-administered medications on file prior to visit.      Past Medical History:  Diagnosis Date  . Boils   . Glaucoma   . Heart murmur   . HTN (hypertension)   . Hx of adenomatous colonic polyps   . Hyperlipidemia   . Osteoporosis   . Pneumonia   . Stroke ([place])   . Type II or unspecified type diabetes mellitus without mention of complication, uncontrolled    No Known Allergies  Social History    Socioeconomic History  . Marital status: Single    Spouse name: Not on file  . Number of children: Not on file  . Years of education: Not on file  . Highest education level: Not on file  Occupational History  . Not on file  Social Needs  . Financial resource strain: Not on file  . Food insecurity:    Worry: Not on  file    Inability: Not on file  . Transportation needs:    Medical: Not on file    Non-medical: Not on file  Tobacco Use  . Smoking status: Former Smoker    Types: Cigarettes    Last attempt to quit: 10/15/2016    Years since quitting: 1.3  . Smokeless tobacco: Never Used  . Tobacco comment: smokes occ.   Substance and Sexual Activity  . Alcohol use: Yes    Alcohol/week: 0.0 standard drinks    Comment: occ  . Drug use: No  . Sexual activity: Not on file  Lifestyle  . Physical activity:    Days per week: Not on file    Minutes per session: Not on file  . Stress: Not on file  Relationships  . Social connections:    Talks on phone: Not on file    Gets together: Not on file    Attends religious service: Not on file    Active member of club or organization: Not on file    Attends meetings of clubs or organizations: Not on file    Relationship status: Not on file  Other Topics Concern  . Not on file  Social History Narrative  . Not on file    Vitals:   02/14/18 1126  BP: 120/72  Pulse: 86  Resp: 12  Temp: 98.4 F (36.9 C)  SpO2: 96%   Body mass index is 28.2 kg/m.  Physical Exam  Nursing note and vitals reviewed. Constitutional: She is oriented to person, place, and time. She appears well-developed. No distress.  HENT:  Head: Normocephalic and atraumatic.  Mouth/Throat: Oropharynx is clear and moist and mucous membranes are normal.  Eyes: Pupils are equal, round, and reactive to light. Conjunctivae are normal.  Cardiovascular: Normal rate and regular rhythm.  No murmur heard. Pulses:      Dorsalis pedis pulses are 2+ on the right side and  2+ on the left side.  Respiratory: Effort normal and breath sounds normal. No respiratory distress.  GI: Soft. She exhibits no mass. There is no hepatomegaly. There is no abdominal tenderness.  Musculoskeletal:        General: No edema.  Lymphadenopathy:    She has no cervical adenopathy.  Neurological: She is alert and oriented to person, place, and time. She has normal strength. Gait abnormal.  Left LE slightly weaker than RLE. Unstable gait assisted with a cane.  Skin: Skin is warm. No rash noted. No erythema.  Psychiatric: She has a normal mood and affect.  fairly groomed, good eye contact.    ASSESSMENT AND PLAN:  Sarah [name] was seen today for hospitalization follow-up.   Orders Placed This Encounter  Procedures  . Basic metabolic panel  . CBC    Lab Results  Component Value Date   WBC 6.3 02/14/2018   HGB 11.7 (L) 02/14/2018   HCT 34.8 (L) 02/14/2018   MCV 92.8 02/14/2018   PLT 377.0 02/14/2018   Lab Results  Component Value Date   CREATININE 0.53 02/14/2018   BUN 16 02/14/2018   NA 141 02/14/2018   K 4.2 02/14/2018   CL 107 02/14/2018   CO2 28 02/14/2018     Homonymous hemianopia, left This is her 2nd CVA. Strongly recommend being more compliant with taking meds and following a healthful diet. Pending to start PT. Appt with ophthalmologist in 3 months.  Essential hypertension Adequately controlled. No changes in current management. DASH-low salt diet recommended. Eye exam recommended annually.  Type II diabetes mellitus with nephropathy ([place]) BS's after hospital discharge seems to be better. No changes in Lantus or Victoza. Next HgA1C in 04/2018  Hyperlipidemia, unspecified hyperlipidemia type No changes in Atorvastatin 80 mg. LDL goal < 70. Low fat diet.  Unstable gait Rx for roller walker given. Fall precautions discussed. PT to be started.   Return if symptoms worsen or fail to improve, for Keep next f/u appt.     Tavionna Grout G.  [name], MD  [facility]. [place] office.

## 2018-02-14 NOTE — Patient Instructions (Addendum)
A few things to remember from today's visit:   Homonymous hemianopia, left  Essential hypertension  Cerebral infarction due to embolism of right middle cerebral artery ([place])  Please call neuro to arrange appt. Fall precautions. PT pending. Continue monitoring blood pressure and sugars.  Keep next appt.   Please be sure medication list is accurate. If a new problem present, please set up appointment sooner than planned today.

## 2018-02-14 NOTE — Telephone Encounter (Addendum)
Transition Care Management Follow-up Telephone Call  Date discharged?  Admit Date: 02/08/2018    Discharge date: 02/10/2018   Recommendations for Outpatient Follow-up:  1. Follow up with PCP in 1-2 weeks 2. Please obtain BMP/CBC in one week 3. Ensure follow-up with neurology 4. Aspirin 325 mg and Plavix 75 mg for 3 months-then after 3 months stop aspirin and continue with just Plavix alone.  Admitted From:  Home Disposition: Outpatient physical therapy Home Health: No Equipment/Devices: None Discharge Condition: Stable CODE STATUS: FULL CODE Diet recommendation:  Heart Healthy/ diabetic diet   How have you been since you were released from the hospital? "been doing okay" Pt is very emotional and was hard to have a full discussion about how she is feeling.   Do you understand why you were in the hospital? yes   Do you understand the discharge instructions? yes   Where were you discharged to? Home with Mother. Sister lives close by and is able to help if needed   Items Reviewed:  Medications reviewed: yes, new medications added - Aspirin 325 mg and Plavix 75 mg for 3 months-then after 3 months stop aspirin and continue with just Plavix alone  Allergies reviewed: yes  Dietary changes reviewed: yes  Referrals reviewed: yes, Pt is waiting on call for PT    Functional Questionnaire:   Activities of Daily Living (ADLs):   She states they are independent in the following: ambulation, bathing and hygiene, feeding, continence, grooming, toileting and dressing States they require assistance with the following: n/a   Any transportation issues/concerns?: yes, "I am not able to drive anymore". Patients sister brought her to her follow up today.    Any patient concerns? yes, Pt is wanting to discuss if okay to resume normal daily routines - pt asking about her hair appt today and if it is safe to do the normal color, cut and wash. I advised that this should be okay but  she can get the reassurance she needs from Dr [name].  Pt having blurred vision and she lost her glasses so she was unable to see to review her medication list upon arriving to the exam room today. Pt is very emotional.    Confirmed importance and date/time of follow-up visits scheduled yes  Provider Appointment booked with Dr [name] 02/14/2018 at 11:30  Confirmed with patient if condition begins to worsen call PCP or go to the ER.  Patient was given the office number and encouraged to call back with question or concerns.  : yes

## 2018-02-15 ENCOUNTER — Ambulatory Visit (INDEPENDENT_AMBULATORY_CARE_PROVIDER_SITE_OTHER): Payer: Medicare Other

## 2018-02-15 ENCOUNTER — Encounter: Payer: Self-pay | Admitting: Family Medicine

## 2018-02-15 DIAGNOSIS — I639 Cerebral infarction, unspecified: Secondary | ICD-10-CM | POA: Diagnosis not present

## 2018-02-16 ENCOUNTER — Encounter: Payer: Self-pay | Admitting: Family Medicine

## 2018-02-17 LAB — CUP PACEART REMOTE DEVICE CHECK
Date Time Interrogation Session: 20200205114118
Implantable Pulse Generator Implant Date: 20181009

## 2018-02-24 NOTE — Progress Notes (Signed)
[facility] Summary Report / Loop Recorder 

## 2018-03-02 ENCOUNTER — Telehealth: Payer: Self-pay

## 2018-03-02 ENCOUNTER — Other Ambulatory Visit: Payer: Self-pay | Admitting: *Deleted

## 2018-03-02 NOTE — Telephone Encounter (Signed)
Copied from [place] [id number]. Topic: Referral - Request for Referral >> Mar 02, 2018 10:29 AM Antonieta Iba C wrote: Pt was seen and discharged from the hospital on 02/10/18. Kim with [facility] is calling in to make PCP aware that the hospital set pt up with a neuro referral but because referral wasn't set up correctly the office was unable to see pt and the pt's case is closed with the hospital so they are unable to have referral update/corrected.   Maudie Mercury would like to know if provider could set up a neuro referral for pt? Pt has been previously seen at [facility] at [address].   Please assist

## 2018-03-02 NOTE — Patient Outreach (Addendum)
[place] [facility]) Care Management  03/02/2018  SATIA WINGER August 30, 1945 [id number]   Care coordination  [facility] RN CM received a voice message x 2 from this patient Cm made an unsuccessful outreach attempt to her on 2/19/202 but was able to reach her on today  Ms Naser confirms she has not had a call from outpatient neuro rehab as of this date She has called and was informed there had been issues with the initial referral  [facility] RN Cm called and spoke with Ubaldo Glassing at the outpatient neuro rehab office who confirms the referral needed further information about the  type rehab needed for the patient. A message was sent by outpatient rehab to the referring MD [facility] RN CM sent message to the referring MD and CM plus C SNP CM [facility] RN CM spoke with [facility] CM office, CM staff, Marcellus Scott and Shiquica at Dr Inez Catalina Jordon's office Dr Theodis Sato office will assist with the neuro rehab referral   [facility] RN CM will continue to collaborate with referring MD,  CM, C SNP CM and primary MD.  CM updated Ms Gritz on assistance to re refer her to neuro rehab via Dr Doug Sou office She was encouraged to return a call prn    Joelene Millin L. [name], RN, BSN, [place] Coordinator Office number [phone] Mobile number [phone]  Main [facility] number [phone] Fax number [phone]

## 2018-03-03 ENCOUNTER — Other Ambulatory Visit: Payer: Self-pay | Admitting: *Deleted

## 2018-03-03 DIAGNOSIS — I639 Cerebral infarction, unspecified: Secondary | ICD-10-CM

## 2018-03-03 NOTE — Telephone Encounter (Signed)
Referral placed as requested.

## 2018-03-03 NOTE — Telephone Encounter (Signed)
Neurology referral can be placed. Thanks, BJ

## 2018-03-03 NOTE — Telephone Encounter (Signed)
Message sent to Dr. [name] for review. 

## 2018-03-06 ENCOUNTER — Other Ambulatory Visit: Payer: Self-pay | Admitting: *Deleted

## 2018-03-06 NOTE — Patient Outreach (Signed)
[place] [facility]) Care Management  03/06/2018  Sarah [name] Nov 03, 1945 [id number]   Care coordination  [facility] RN CM spoke with [name] at Dr Doug Sou office after contact from the patient to confirm she has not had a call from neuro rehabilitation at this times  Cm discussed with [name] that the patient is still needing a referral to neuro rehabilitation not neurology [name] to assist with sending this request to Dr [name]  [facility] RN CM to follow up with the patient within 4 business days to confirm she has received a call from neuro rehabilitation   [place]. [name], RN, BSN, [place] Coordinator Office number [phone] Mobile number [phone]  Main [facility] number [phone] Fax number [phone]

## 2018-03-06 NOTE — Telephone Encounter (Signed)
Kim with [facility] calling and states that this referral was supposed to be for neural rehab, not neurology. Please advise. Patient is needing a new referral for [facility] at [address]. Please advise.

## 2018-03-07 ENCOUNTER — Other Ambulatory Visit: Payer: Self-pay | Admitting: *Deleted

## 2018-03-07 DIAGNOSIS — I639 Cerebral infarction, unspecified: Secondary | ICD-10-CM

## 2018-03-07 NOTE — Patient Outreach (Signed)
[facility] [facility]) Care Management  03/07/2018  Sarah [name] Mar 24, 1945 [id number]   Care coordination   [facility] RN CM received a call from Sarah [name] stating that she had not been called to schedule her for neuro rehab She reports contacting the neuro rehab office and being informed that the referral was sent but incomplete.  [facility] RN CM and Sarah [name] had a conference call with Sarah [name] and Sarah [name] at [facility] neuro rehab office to discuss the concerns with electronic referrals x 2.   Sarah [name], referral coordinator for neuro rehab, Sarah [name] and RN Cm agreed on sending a paper copy of the referral to Dr [name] office fax [phone] to be resubmitted. The type of rehab needed for outpatient rehab is PT.  CM sent a message to Dr [name] and the office staff about the paper form and it needing to indicate PT as the type of discipline Sarah [name] is needing.   [facility] RN CM will close this case  Sarah [name] was encouraged to call back if she has not heard from neuro rehab   [place]. [name], RN, BSN, [place] Coordinator Office number [phone] Mobile number [phone]  Main [facility] number [phone] Fax number [phone]

## 2018-03-07 NOTE — Telephone Encounter (Signed)
Referral placed for neural rehab for [facility] as requested.

## 2018-03-08 ENCOUNTER — Telehealth: Payer: Self-pay | Admitting: Family Medicine

## 2018-03-08 ENCOUNTER — Other Ambulatory Visit: Payer: Self-pay | Admitting: *Deleted

## 2018-03-08 NOTE — Patient Outreach (Signed)
[place] [facility]) Care Management  03/08/2018  Sarah [name] March 03, 1945 [id number]   Care coordination  [facility] RN CM received a call from Sarah [name] related to not hearing from Neuro rehab about an appointment. She voiced frustration in her voice message and requests to speak with "someone at [facility]" CM contacted Q Jones at Dr [name] office. Still pending completion of faxed referral form from neuro rehab per staff member. Reports form received after noon and given to MD [facility] RN CM returned a call to [name], Updated her on response from primary MD office staff and provided her with [facility] patient experience department number. CM apologized to her for her inconvenience, concerns and frustration related to attempts to get neuro rehab set up. She voiced appreciation of services rendered  [place] L. [name], RN, BSN, [place] Coordinator Office number [phone] Mobile number [phone]  Main [facility] number [phone] Fax number [phone]

## 2018-03-08 NOTE — Telephone Encounter (Signed)
Faxed received from Outpatient rehab, completed by PCP and faxed back to [phone]. Spoke with patient and informed her that form had been faxed to the outpatient rehab. Nothing further needed at this time.

## 2018-03-08 NOTE — Telephone Encounter (Unsigned)
Copied from [place] [id number]. Topic: Referral - Request for Referral >> Mar 02, 2018 10:29 AM Antonieta Iba C wrote: Pt was seen and discharged from the hospital on 02/10/18. Kim with [facility] is calling in to make PCP aware that the hospital set pt up with a neuro referral but because referral wasn't set up correctly the office was unable to see pt and the pt's case is closed with the hospital so they are unable to have referral update/corrected.   Maudie Mercury would like to know if provider could set up a neuro referral for pt? Pt has been previously seen at [facility] at [address] [address].   Please assist >> Mar 07, 2018  4:45 PM Vernona Rieger wrote: Neurology would like something faxed to them to let them know which services she needs, physical therapy, occupational therapy or speech or all of them. She said shes been trying to fax something to office the last hour and it keeps saying transmission failed, fax number [phone]

## 2018-03-09 ENCOUNTER — Other Ambulatory Visit: Payer: Self-pay | Admitting: *Deleted

## 2018-03-09 NOTE — Patient Outreach (Signed)
[facility]) Care Management  03/09/2018  Sarah [name] 1945-01-29 [id number]   Care coordination  Sarah [name] called to inform CM that she has been scheduled for neuro rehab session on Wednesday 03/15/2018 0930/1015 She voices appreciation of services rendered  CM celebrated with Sarah [name] related to her upcoming neuro rehab services    [place]arks Scalera L. [name], RN, BSN, [place] Coordinator Office number [phone] Mobile number [phone]  Main THN number [phone] Fax number [phone]

## 2018-03-14 ENCOUNTER — Telehealth: Payer: Self-pay | Admitting: Family Medicine

## 2018-03-14 NOTE — Telephone Encounter (Signed)
Copied from [place] [phone]. Topic: General - Other >> Mar 14, 2018 11:31 AM Lennox Solders wrote: Reason for CRM: pt is calling and needs a new rx for walker roller with seat. Pt saw dr [name] on 02-14-2018 and was given rx for walker roller without seat. Pt would like to pick up new rx today if possible. Pt will [facility] to [facility]

## 2018-03-14 NOTE — Telephone Encounter (Signed)
Spoke with patient and informed her that Rx was ready for pick-up. Patient asked if it could be faxed to [place]. Rx faxed as requested. Nothing further needed at this time.

## 2018-03-15 ENCOUNTER — Encounter: Payer: Self-pay | Admitting: Physical Therapy

## 2018-03-15 ENCOUNTER — Other Ambulatory Visit: Payer: Self-pay

## 2018-03-15 ENCOUNTER — Encounter: Payer: Self-pay | Admitting: Occupational Therapy

## 2018-03-15 ENCOUNTER — Ambulatory Visit: Payer: HMO | Admitting: Occupational Therapy

## 2018-03-15 ENCOUNTER — Ambulatory Visit: Payer: HMO | Attending: Family Medicine | Admitting: Physical Therapy

## 2018-03-15 DIAGNOSIS — R278 Other lack of coordination: Secondary | ICD-10-CM

## 2018-03-15 DIAGNOSIS — R2681 Unsteadiness on feet: Secondary | ICD-10-CM

## 2018-03-15 DIAGNOSIS — M6281 Muscle weakness (generalized): Secondary | ICD-10-CM | POA: Insufficient documentation

## 2018-03-15 DIAGNOSIS — R2689 Other abnormalities of gait and mobility: Secondary | ICD-10-CM

## 2018-03-15 DIAGNOSIS — R41842 Visuospatial deficit: Secondary | ICD-10-CM | POA: Diagnosis not present

## 2018-03-15 DIAGNOSIS — H53462 Homonymous bilateral field defects, left side: Secondary | ICD-10-CM

## 2018-03-15 DIAGNOSIS — R4701 Aphasia: Secondary | ICD-10-CM | POA: Diagnosis not present

## 2018-03-15 NOTE — Therapy (Signed)
[facility] [address] [place], [place], [address] Phone: [phone]   Fax:  [phone]  Physical Therapy Evaluation  Patient Details  Name: Sarah [name] MRN: [id number] Date of Birth: June 18, 1945 Referring Provider (PT): Betty [name], MD   Encounter Date: 03/15/2018  PT End of Session - 03/15/18 1118    Visit Number  1    Number of Visits  9    Date for PT Re-Evaluation  04/19/18    Authorization Type  [employer] Medicare    PT Start Time  1018    PT Stop Time  1055    PT Time Calculation (min)  37 min    Activity Tolerance  Patient tolerated treatment well    Behavior During Therapy  [facility] for tasks assessed/performed       Past Medical History:  Diagnosis Date  . Boils   . Glaucoma   . Heart murmur   . HTN (hypertension)   . Hx of adenomatous colonic polyps   . Hyperlipidemia   . Osteoporosis   . Pneumonia   . Stroke ([place])   . Type II or unspecified type diabetes mellitus without mention of complication, uncontrolled     Past Surgical History:  Procedure Laterality Date  . ABDOMINAL HYSTERECTOMY    . CARPAL TUNNEL RELEASE     right  . COLONOSCOPY  12-10-10   per Dr. Deatra Ina, clear, repeat in 7 yrs   . INCISION AND DRAINAGE PERIRECTAL ABSCESS N/A 09/26/2015   Procedure: IRRIGATION AND DEBRIDEMENT PERIRECTAL ABSCESS;  Surgeon: Mickeal Skinner, MD;  Location: [facility];  Service: General;  Laterality: N/A;  . KNEE ARTHROSCOPY     right  . LOOP RECORDER INSERTION N/A 10/19/2016   Procedure: LOOP RECORDER INSERTION;  Surgeon: Thompson Grayer, MD;  Location: [facility];  Service: Cardiovascular;  Laterality: N/A;  . ORIF ANKLE FRACTURE Right 03/24/2012   Procedure: OPEN REDUCTION INTERNAL FIXATION (ORIF) ANKLE FRACTURE;  Surgeon: Newt Minion, MD;  Location: [facility];  Service: Orthopedics;  Laterality: Right;  Open Reduction Internal Fixation Right Bimalleolar ankle fracture  . POLYPECTOMY    . TEE  WITHOUT CARDIOVERSION N/A 10/18/2016   Procedure: TRANSESOPHAGEAL ECHOCARDIOGRAM (TEE);  Surgeon: Fay Records, MD;  Location: [facility] ENDOSCOPY;  Service: Cardiovascular;  Laterality: N/A;  . TONSILLECTOMY      There were no vitals filed for this visit.   Subjective Assessment - 03/15/18 1020    Subjective  In Jan went to eye MD - advised patient go to the ED - findings of acute CVA. Reports greatest concerns of vision and balance. Currently unable to read/drive. Feels as if she has to walk slowly - has not ran into any objects lately. Noted L visual deficits. Has a rx for rollator - has not gotten AD yet.  No falls recently. Do still get some headaches - takes tylenol.     Pertinent History  DM2, CVA, HTN, osteoporosis, glaucoma, L visual field deficits    Patient Stated Goals  "I need to get strong"    Currently in Pain?  No/denies   intermittent headaches        [facility] PT Assessment - 03/15/18 1027      Assessment   Medical Diagnosis  CVA    Referring Provider (PT)  Betty [name], MD    Onset Date/Surgical Date  02/08/18    Hand Dominance  Right    Next MD Visit  03/23/2018    Prior  Therapy  [facility] 2018-2019      Precautions   Precautions  Fall      Restrictions   Weight Bearing Restrictions  No      Balance Screen   Has the patient fallen in the past 6 months  No    Has the patient had a decrease in activity level because of a fear of falling?   No    Is the patient reluctant to leave their home because of a fear of falling?   No      Home Environment   Living Environment  Private residence    Living Arrangements  Parent    Type of [place]  Two level    Alternate Level Stairs-Number of Steps  8    Alternate Level Stairs-Rails  Right;Left;Can reach both    Additional Comments  manages mothers medication      Prior Function   Level of Independence  Independent with basic ADLs;Independent with homemaking with ambulation;Independent with household mobility  with device    Vocation  Retired    Leisure  playing with great nieces and nephews, watch soap operas, used to enjoy reading, cooking, cares for her 22 year old mother      Cognition   Overall Cognitive Status  Within Functional Limits for tasks assessed      Sensation   Light Touch  Appears Intact   B LE     Coordination   Gross Motor Movements are Fluid and Coordinated  Yes      Posture/Postural Control   Posture/Postural Control  Postural limitations    Postural Limitations  Rounded Shoulders;Forward head      ROM / Strength   AROM / PROM / Strength  AROM;Strength      AROM   Overall AROM   Within functional limits for tasks performed    AROM Assessment Site  Hip;Knee;Ankle      Strength   Overall Strength  Deficits    Overall Strength Comments  B hips 4/5; all other LE MMT grossly 4+/5 in sitting      Ambulation/Gait   Ambulation/Gait  Yes    Ambulation/Gait Assistance  6: Modified independent (Device/Increase time)    Ambulation Distance (Feet)  200 Feet    Assistive device  None    Gait Pattern  Step-through pattern;Decreased stride length;Decreased weight shift to right    Ambulation Surface  Level;Indoor    Gait velocity  2.74 ft/sec      Balance   Balance Assessed  Yes      Standardized Balance Assessment   Standardized Balance Assessment  Berg Balance Test;Five Times Sit to Stand    Five times sit to stand comments   14.43      Berg Balance Test   Sit to Stand  Able to stand without using hands and stabilize independently    Standing Unsupported  Able to stand safely 2 minutes    Sitting with Back Unsupported but Feet Supported on Floor or Stool  Able to sit safely and securely 2 minutes    Stand to Sit  Sits safely with minimal use of hands    Transfers  Able to transfer safely, minor use of hands    Standing Unsupported with Eyes Closed  Able to stand 10 seconds with supervision    Standing Unsupported with Feet Together  Able to place feet together  independently and stand for 1 minute with supervision  From Standing, Reach Forward with Outstretched Arm  Can reach forward >12 cm safely (5")    From Standing Position, Pick up Object from [place] to pick up shoe safely and easily    From Standing Position, Turn to Look Behind Over each Shoulder  Turn sideways only but maintains balance    Turn 360 Degrees  Able to turn 360 degrees safely but slowly    Standing Unsupported, Alternately Place Feet on Step/Stool  Able to stand independently and complete 8 steps >20 seconds    Standing Unsupported, One Foot in Front  Able to plae foot ahead of the other independently and hold 30 seconds    Standing on One Leg  Tries to lift leg/unable to hold 3 seconds but remains standing independently    Total Score  44    Berg comment:  high fall risk      Functional Gait  Assessment   Gait assessed   Yes    Gait Level Surface  Walks 20 ft in less than 7 sec but greater than 5.5 sec, uses assistive device, slower speed, mild gait deviations, or deviates 6-10 in outside of the 12 in walkway width.    Change in Gait Speed  Able to change speed, demonstrates mild gait deviations, deviates 6-10 in outside of the 12 in walkway width, or no gait deviations, unable to achieve a major change in velocity, or uses a change in velocity, or uses an assistive device.    Gait with Horizontal Head Turns  Performs head turns smoothly with slight change in gait velocity (eg, minor disruption to smooth gait path), deviates 6-10 in outside 12 in walkway width, or uses an assistive device.    Gait with Vertical Head Turns  Performs task with slight change in gait velocity (eg, minor disruption to smooth gait path), deviates 6 - 10 in outside 12 in walkway width or uses assistive device    Gait and Pivot Turn  Pivot turns safely within 3 sec and stops quickly with no loss of balance.    Step Over Obstacle  Is able to step over one shoe box (4.5 in total height) without changing  gait speed. No evidence of imbalance.    Gait with Narrow Base of Support  Ambulates 4-7 steps.    Gait with Eyes Closed  Walks 20 ft, slow speed, abnormal gait pattern, evidence for imbalance, deviates 10-15 in outside 12 in walkway width. Requires more than 9 sec to ambulate 20 ft.    Ambulating Backwards  Walks 20 ft, uses assistive device, slower speed, mild gait deviations, deviates 6-10 in outside 12 in walkway width.    Steps  Alternating feet, must use rail.    Total Score  19    FGA comment:  medium fall risk                Objective measurements completed on examination: See above findings.              PT Education - 03/15/18 1118    Education Details  exam findings, fall risk stratification, POC, goals of treatment    Person(s) Educated  Patient    Methods  Explanation    Comprehension  Verbalized understanding       PT Short Term Goals - 03/15/18 1124      PT SHORT TERM GOAL #1   Title  same as LTGs        PT Long Term Goals - 03/15/18  [place] #1   Title  patient to be independent with advanced HEP for balance and strength    Time  4    Status  New    Target Date  04/19/18      PT LONG TERM GOAL #2   Title  Patient to improve Berg to >/= 50/56 demonstrating reduced fall risk    Baseline  44/56    Time  4    Period  Weeks    Status  New    Target Date  04/19/18      PT LONG TERM GOAL #3   Title  patient to improve FGA to >/= 24/30 demonstrating improved dynamic balance    Baseline  19/30    Time  4    Status  New    Target Date  04/19/18      PT LONG TERM GOAL #4   Title  patient to improve gait speed to >/= 3.5 ft/ sec for improved community navigation and safety    Baseline  2.74 ft/sec    Time  4    Period  Weeks    Status  New    Target Date  04/19/18      PT LONG TERM GOAL #5   Title  patient to ambulate over various surfaces (even and uneven) as well as up/down inclines without LOB or instability  with LRAD for >1000 feet    Time  4    Period  Weeks    Status  New    Target Date  04/19/18             Plan - 03/15/18 1118    Clinical Impression Statement  Ms. Eagles is a very pleasant 72 y/o female presenting to OPPT regarding recent nonhemorrhagic infarcts involving R parieto-occipital region with L sided visual deficits. Patient today with primary concerns regarding vision and balance. Patient with medium to high fall risk as noted by Merrilee Jansky Balance and FGA. Patient reporting reducesd gait speed as compared to baseline. Patient to benefit from skilled PT intervention to address strength, balance, functional mobility, safety for reduced fall risk.     Personal Factors and Comorbidities  Age;Transportation;Comorbidity 3+    Comorbidities  HTN, CVA, DM2, currently unable to drive, 73 y/o    Examination-Activity Limitations  Stairs;Locomotion Level    Examination-Participation Restrictions  Community Activity;Driving;Shop    Stability/Clinical Decision Making  Evolving/Moderate complexity    Clinical Decision Making  Moderate    Rehab Potential  Good    PT Frequency  2x / week    PT Duration  4 weeks    PT Treatment/Interventions  ADLs/Self Care Home Management;Electrical Stimulation;DME Instruction;Gait training;Stair training;Functional mobility training;Patient/family education;Neuromuscular re-education;Balance training;Therapeutic exercise;Therapeutic activities;Manual techniques;Visual/perceptual remediation/compensation;Vestibular    PT Next Visit Plan  establish HEP    Consulted and Agree with Plan of Care  Patient       Patient will benefit from skilled therapeutic intervention in order to improve the following deficits and impairments:  Abnormal gait, Decreased balance, Decreased strength, Decreased knowledge of use of DME, Difficulty walking  Visit Diagnosis: Unsteadiness on feet  Other abnormalities of gait and mobility  Muscle weakness  (generalized)     Problem List Patient Active Problem List   Diagnosis Date Noted  . Homonymous hemianopia, left 02/08/2018  . HTN (hypertension)   . Trigger finger, right index finger 10/17/2017  . Low back pain 07/29/2017  . Carotid artery  stenosis 11/02/2016  . Left arm weakness   . Diastolic dysfunction   . Hypertension with heart disease   . Tobacco abuse   . Acute blood loss anemia   . Cerebral infarction due to embolism of right middle cerebral artery ([place])   . Perirectal abscess 09/26/2015  . Vitamin D deficiency 07/23/2015  . Osteopenia 02/16/2013  . Ankle fracture 01/19/2013  . Anemia 09/27/2011  . Type II diabetes mellitus with nephropathy ([place]) 09/24/2011  . Hyperlipidemia 09/24/2011    Lanney Gins, PT, DPT Supplemental Physical Therapist 03/15/18 12:58 PM Pager: [phone] Office: [facility] [address] [place], [place], [address] Phone: [phone]   Fax:  [phone]  Name: Sarah [name] MRN: [id number] Date of Birth: 02-Aug-1945

## 2018-03-15 NOTE — Therapy (Signed)
[facility] [address] [place], [place], [address] Phone: [phone]   Fax:  [phone]  Occupational Therapy Evaluation  Patient Details  Name: Sarah [name] MRN: [id number] Date of Birth: August 29, 1945 Referring Provider (OT): Betty [name]   Encounter Date: 03/15/2018  OT End of Session - 03/15/18 1034    Visit Number  1    Number of Visits  9    Authorization Type  [employer] Medicare    OT Start Time  [phone]    OT Stop Time  1015    OT Time Calculation (min)  50 min    Behavior During Therapy  [facility] for tasks assessed/performed       Past Medical History:  Diagnosis Date  . Boils   . Glaucoma   . Heart murmur   . HTN (hypertension)   . Hx of adenomatous colonic polyps   . Hyperlipidemia   . Osteoporosis   . Pneumonia   . Stroke ([place])   . Type II or unspecified type diabetes mellitus without mention of complication, uncontrolled     Past Surgical History:  Procedure Laterality Date  . ABDOMINAL HYSTERECTOMY    . CARPAL TUNNEL RELEASE     right  . COLONOSCOPY  12-10-10   per Dr. Deatra Ina, clear, repeat in 7 yrs   . INCISION AND DRAINAGE PERIRECTAL ABSCESS N/A 09/26/2015   Procedure: IRRIGATION AND DEBRIDEMENT PERIRECTAL ABSCESS;  Surgeon: Mickeal Skinner, MD;  Location: [facility];  Service: General;  Laterality: N/A;  . KNEE ARTHROSCOPY     right  . LOOP RECORDER INSERTION N/A 10/19/2016   Procedure: LOOP RECORDER INSERTION;  Surgeon: Thompson Grayer, MD;  Location: [facility];  Service: Cardiovascular;  Laterality: N/A;  . ORIF ANKLE FRACTURE Right 03/24/2012   Procedure: OPEN REDUCTION INTERNAL FIXATION (ORIF) ANKLE FRACTURE;  Surgeon: Newt Minion, MD;  Location: [facility];  Service: Orthopedics;  Laterality: Right;  Open Reduction Internal Fixation Right Bimalleolar ankle fracture  . POLYPECTOMY    . TEE WITHOUT CARDIOVERSION N/A 10/18/2016   Procedure: TRANSESOPHAGEAL ECHOCARDIOGRAM (TEE);  Surgeon:  Fay Records, MD;  Location: [facility];  Service: Cardiovascular;  Laterality: N/A;  . TONSILLECTOMY      There were no vitals filed for this visit.  Subjective Assessment - 03/15/18 0936    Subjective   I had a scheduled eye doctor appointment, and they sent me to the hospital    Pertinent History  history of stroke in 2018- nearly full recovery    Currently in Pain?  No/denies   Has had on/off headache       [facility] OT Assessment - 03/15/18 0001      Assessment   Medical Diagnosis  Stroke-R parietal-occipital    Referring Provider (OT)  Betty [name]    Onset Date/Surgical Date  02/10/18    Hand Dominance  Right    Next MD Visit  03/23/2018    Prior Therapy  OP OT 2018-2019      Precautions   Precautions  Fall      Restrictions   Weight Bearing Restrictions  No      Balance Screen   Has the patient fallen in the past 6 months  No      Prior Function   Level of Independence  Independent with basic ADLs;Independent with homemaking with ambulation;Independent with household mobility with device    Vocation  Retired    Leisure  playing with great nieces and  nephews, watch soap operas, used to enjoy reading, cooking, cares for her 45 year old mother      ADL   Eating/Feeding  Independent    Grooming  Independent    Upper Body Bathing  Independent    Lower Body Bathing  Independent    Upper Body Dressing  Independent    Lower Body Dressing  Independent    Toilet Transfer  Modified independent    Scientist, research (medical)  Grab bars      IADL   Prior Level of Function Shopping  independent    Shopping  Needs to be accompanied on any shopping trip    Prior Level of [place] house alone or with occasional assistance    Prior Level of Function Meal Prep  independent    Meal Prep  Able to complete simple warm meal prep    Prior Level of Function Medication  Managment  independent    Medication Management  Is responsible for taking medication in correct dosages at correct time    Prior Level of Function Financial Management  independent    Psychiatrist financial matters independently (budgets, writes checks, pays rent, bills goes to bank), collects and keeps track of income      Written Expression   Dominant Hand  Right      Vision - History   Baseline Vision  Wears glasses only for reading   and driving at night   Visual History  Cataracts    Patient Visual Report  Eye fatigue/eye pain/headache    Additional Comments  She lost her glasses- waiting for new prescription to determine if vision clears      Vision Assessment   Eye Alignment  Impaired (comment)   left eye turns medially slightly   Ocular Range of Motion  Within Functional Limits    Alignment/Gaze Preference  Within Defined Limits    Tracking/Visual Pursuits  Impaired - to be further tested in functional context   diplopia more evident in right fields, R eye dyscoord   Saccades  Additional eye shifts occurred during testing    Convergence  Impaired (comment)    Visual Fields  Left homonymous Hemianopsia   per hospital report   Acuity  Impaired - to be further tested in functional context    Diplopia Assessment  Objects split on top of one another   reports as slight shadow behind   Patient has diffculty with activities due to visual impairment  --   reading for pleasure, grocery shopping, light sensitive   Comment  Patient has difficulty articulating specifics of visual deficit, will need furtehr testing - repors eye strain, eye pain/headache, light sensitivity, diplopia, and visual field loss- to the point where she discontinued driving.        Activity Tolerance   Activity Tolerance  --   Per patient report limited by fatigue     Cognition   Overall Cognitive Status  Within Functional Limits for tasks assessed      Sensation   Light Touch  Appears  Intact      Coordination   9 Hole Peg Test  Right;Left    Right 9 Hole Peg Test  29.88    Left 9 Hole Peg Test  37.87      Perception   Perception  Within Functional Limits      Praxis  Praxis  Intact      ROM / Strength   AROM / PROM / Strength  AROM;Strength      AROM   Overall AROM   Within functional limits for tasks performed    AROM Assessment Site  Shoulder;Elbow    Right/Left Shoulder  Right;Left      Hand Function   Right Hand Gross Grasp  Impaired    Right Hand Grip (lbs)  16    Right Hand Lateral Pinch  5 lbs    Left Hand Gross Grasp  Impaired    Left Hand Grip (lbs)  25    Left Hand Lateral Pinch  7 lbs                      OT Education - 03/15/18 1033    Education Details  Reviewed eval results, and plan for OT.  Discussed compensation and remedication as related to eye functioning    Person(s) Educated  Patient    Methods  Explanation    Comprehension  Verbalized understanding       OT Short Term Goals - 03/15/18 1051      OT SHORT TERM GOAL #1   Title  XXXXX      OT SHORT TERM GOAL #2   Title  XXXXX      OT SHORT TERM GOAL #3   Title  Technical brewer      OT SHORT TERM GOAL #4   Title  XXXXXX      OT SHORT TERM GOAL #5   Title  [id number]        OT Long Term Goals - 03/15/18 1053      OT LONG TERM GOAL #1   Title  Pt will be mod I with upgraded HEP for R/L Coordination due 04/14/18    Time  4    Period  Weeks    Status  New    Target Date  04/14/18      OT LONG TERM GOAL #2   Title  Pt will demonstrate improved grip strength to at least 35 lbs R/L to assist with opening bottles or packages.      Baseline  R:  16LB, L: 25LB    Time  4    Period  Weeks    Status  New      OT LONG TERM GOAL #3   Title  Pt will demonstrate improved coordination as evidenced by decreasing time on 9 hole peg test to under 30 seconds in both hands to aide with typing, maipulating change/paper money, etc.      Baseline  R:  29.88, L:  37.87    Time   4    Period  Weeks    Status  New      OT LONG TERM GOAL #4   Title  Patient will navigate in moderately busy environment (e.g. grocery store) without hitting obstacles per patient's report    Time  4    Period  Weeks    Status  New      OT LONG TERM GOAL #5   Title  Patient will demonstate compensatory strategies that will allow her to comfortably read for pleasure for 10-15 min at a time without eye strain, headache.      Time  4    Period  Weeks    Status  New            Plan - 03/15/18 1036  Clinical Impression Statement  Patient is a 73 year old woman referred to OT following a R Parietal-Occipital Stroke on 02/10/18, who presents to OT evaluation with visual changes- field cut / hemianopsia, diplopia, light sensitivity, and eye pain/strain fatigue, new Right sided weakness in hand, residual Left sided weakness hand - decreased coordination L>R, decreased activity tolerance, and decreased balance.  Patient is well known to this clinic as she received OP OT in late 2018-early 2019 for a stroke with left hemiplegia.  Patient did well with therapy last time, and she met and exceeded all of her OT goals in fewer than expected visits.  Patient is extremely motivated to be as independent as possible.  Patient will benefit from skilled OT intervention to help her regain hand strength and coordiantion, and to further assess visual deficits and help determine areas for remediation and also compensation to help her safely return to ADL/IADL and leisure activities she enjoys.    OT Occupational Profile and History  Detailed Assessment- Review of Records and additional review of physical, cognitive, psychosocial history related to current functional performance    Occupational performance deficits (Please refer to evaluation for details):  ADL's;IADL's;Leisure    Body Structure / Function / Physical Skills  ADL;Coordination;Endurance;GMC;UE functional  use;Balance;Vestibular;Vision;Pain;IADL;Decreased knowledge of use of DME;Dexterity;FMC;Strength    Rehab Potential  Good    Clinical Decision Making  Several treatment options, min-mod task modification necessary    Comorbidities Affecting Occupational Performance:  Presence of comorbidities impacting occupational performance    Comorbidities impacting occupational performance description:  prior stroke with residual left sided hemiparesis, diabetes, hypertension    Modification or Assistance to Complete Evaluation   Min-Moderate modification of tasks or assist with assess necessary to complete eval    OT Frequency  2x / week    OT Duration  4 weeks    OT Treatment/Interventions  Self-care/ADL training;Therapeutic exercise;Visual/perceptual remediation/compensation;Patient/family education;Neuromuscular education;Balance training;Therapeutic activities;Functional Mobility Training;DME and/or AE instruction    Plan  Further assess vision - tabletop and environmental, acuity, narrow down area for field cut and diplopia, if time review HEP putty from before - could add for right and left hand    Recommended Other Services  Neurooptometry if condition persists    Consulted and Agree with Plan of Care  Patient       Patient will benefit from skilled therapeutic intervention in order to improve the following deficits and impairments:  Body Structure / Function / Physical Skills  Visit Diagnosis: Visuospatial deficit - Plan: Ot plan of care cert/re-cert  Other lack of coordination - Plan: Ot plan of care cert/re-cert  Muscle weakness (generalized) - Plan: Ot plan of care cert/re-cert  Unsteadiness on feet - Plan: Ot plan of care cert/re-cert  Homonymous bilateral field defects, left side - Plan: Ot plan of care cert/re-cert    Problem List Patient Active Problem List   Diagnosis Date Noted  . Homonymous hemianopia, left 02/08/2018  . HTN (hypertension)   . Trigger finger, right index  finger 10/17/2017  . Low back pain 07/29/2017  . Carotid artery stenosis 11/02/2016  . Left arm weakness   . Diastolic dysfunction   . Hypertension with heart disease   . Tobacco abuse   . Acute blood loss anemia   . Cerebral infarction due to embolism of right middle cerebral artery ([place])   . Perirectal abscess 09/26/2015  . Vitamin D deficiency 07/23/2015  . Osteopenia 02/16/2013  . Ankle fracture 01/19/2013  . Anemia 09/27/2011  . Type  II diabetes mellitus with nephropathy ([place]) 09/24/2011  . Hyperlipidemia 09/24/2011    [name], OTR/L 03/15/2018, 11:12 AM  [facility] [address] [place], [place], [address] Phone: [phone]   Fax:  [phone]  Name: Sarah [name] MRN: [id number] Date of Birth: 12/24/1945

## 2018-03-20 ENCOUNTER — Ambulatory Visit (INDEPENDENT_AMBULATORY_CARE_PROVIDER_SITE_OTHER): Payer: HMO | Admitting: *Deleted

## 2018-03-20 DIAGNOSIS — I639 Cerebral infarction, unspecified: Secondary | ICD-10-CM

## 2018-03-21 LAB — CUP PACEART REMOTE DEVICE CHECK
Date Time Interrogation Session: 20200309120905
Implantable Pulse Generator Implant Date: 20181009

## 2018-03-22 ENCOUNTER — Other Ambulatory Visit: Payer: Self-pay

## 2018-03-22 ENCOUNTER — Ambulatory Visit: Payer: HMO

## 2018-03-22 ENCOUNTER — Other Ambulatory Visit: Payer: Self-pay | Admitting: *Deleted

## 2018-03-22 NOTE — Patient Outreach (Signed)
  [facility]) Care Management Chronic Special Needs Program  03/22/2018  Name: Sarah [name] DOB: 1945-12-20  MRN: [id number]  Sarah [name] is enrolled in a chronic special needs plan for  Diabetes. A completed health risk assessment has not been received from the client and client has not responded to outreach attempts by their health care concierge.  The client's individualized care plan was developed based on available data.  Plan:  . Send unsuccessful outreach letter with a copy of individualized care plan to client . Send education materials on fall prevention, diabetes, and HTN . Send Emergency planning/management officer . Send individualized care plan to provider  Chronic care management coordinator, Thea Silversmith, will attempt outreach in 2-4 months.   Barrington Ellison RN,CCM,CDE [place] Management Coordinator Office Phone [phone] Office Fax [phone]

## 2018-03-22 NOTE — Progress Notes (Signed)
[facility]  PATIENT: Sarah [name] DOB: [birth year]   REASON FOR VISIT: Follow-up for stroke right MCA 10/2016 readmitted 02/08/2018 for left hemianopsia here for hospital follow-up HISTORY FROM: Patient    HISTORY OF PRESENT ILLNESS:02/08/2018. Seen by Dr. Erlinda Hong stroke service she has significant L hemianopia. No other complaints. Stroke workup underway. right parieto-occipital infarcts in setting of progressive R MCA stenosis, infarcts secondary to large vessel disease source  Resultant L hemianopia   CT head R parietal watershed infarct. Small vessel disease.   MRI  Acute cortical and subcortical R P-O infarcts. Old R CR and R frontal lobe infarcts. Small vessel disease.   MRA head  No LVO. Focal stenosis R M1, R M2/M3, L M1  MRA neck B VA origin atherosclerosis 30-50%  2D Echo  pending   Loop interrogation - last check 1/3 w/o AF - interrogation today without AF  LDL 76  HgbA1c 13.5  Lovenox 40 mg sq daily for VTE prophylaxis  clopidogrel 75 mg daily prior to admission, now on aspirin 325 mg daily and clopidogrel 75 mg daily. Continue DAPT x 3 months then plavix alone  Therapy recommendations:  OP OT and PT  Disposition:  Home  Patient advised not to drive  Hospital follow-up 3/12/2020CM Sarah [name], 73 year old female returns for follow-up with readmission for stroke 1/29/ 2020.  MRI of the brain acute cortical and subcortical RPO infarcts.  She initially presented with left hemianopsia.  She is now on Plavix and aspirin for 3 months and then Plavix alone.  She has not had further stroke or TIA symptoms.  She has minimal bruising and no bleeding.  Hemoglobin A1c 13.5 on hospital admission.  She claims she is trying to get that down to a more reasonable level.  She remains on Lipitor without myalgias.  Blood pressure in the office today 146/87.  She continues to go to [facility].  She is to start rehab physical therapy and occupational  therapy today.  Loop recorder so far no atrial fibrillation.  She is not driving due to her vision.  She returns for reevaluation    Previous history:  SarahJolisa M Hendersonis a 73 y.o.femalewith history of DM, ongoing tobacco use, HLD, HTN admitted on 10/15/16 for left-sided weakness.  CT head showed focal hypodensity in the posterior right frontal WM deep white matter.  MRI showed multifocal acute infarct within the right MCA. MRA head distal right MCA M1 segment conclusion.  Carotid Doppler 40-59% right ICA stenosis.  However, CT of neck unremarkable.  EF 55-60%.  10/18/16 with worsening left sided weakness,  repeat MRI showed no significant change.  TEE unremarkable and loop recorder placed.  LDL 98 and A1c 12.9.  She was put on dual antiplatelet and Lipitor 40 on discharged to CIR.  Interval History 12/07/16 Dr Erlinda Hong During the interval time, the patient has been doing well.  Left sided weakness much improved.  Still has OT/PT/speech therapy as outpatient.  Has quit smoking.  BP still on the high side today in clinic 151/78.  Glucose not in good control, ranging from 200-270.  Patient has PCP follow-up tomorrow.  Patient complains of intermittent double vision, not sure if binocular or monocular.  Request for driving privilege. UPDATE 2/27/2019CM Sarah [name], 73 year old female returns for follow-up with history of stroke in October 2018.  Her left-sided weakness is much improved however she continues with speech therapy occupational therapy and physical therapy.  She is no longer smoking.  She remains  on Plavix for secondary stroke prevention without further stroke or TIA symptoms.  She has minimal bruising and no bleeding.  She is on Lipitor without myalgias.  Blood pressure in the office 130/75.  Blood sugars continue to be a problem CBGs range 199- 200 she claims she is compliant with her insulin.  She is back to driving during the day.   Loop recorder so far no atrial fibrillation.  She  returns for reevaluation without new neurologic symptoms UPDATE 8/29/2019CM Sarah [name], 73 year old female returns for follow-up with history of stroke in October 2018.  Her left-sided weakness has improved her speech has improved she is quit smoking in October of last year.  She remains on Plavix for secondary stroke prevention with minimal bruising and no bleeding and no further stroke or TIA symptoms.  She remains on Lipitor without myalgias blood pressure in the office today 128/62.  Blood sugars continue to be her biggest problem.  Most recent hemoglobin A1c 14.4 on 07/29/2017.  She has had diabetic training/education in the past.  Loop recorder so far no atrial fibrillation.  She is back to her normal activities of daily living.  [facility].  She returns for reevaluation   REVIEW OF SYSTEMS: Full 14 system review of systems performed and notable only for those listed, all others are neg:  Constitutional: neg  Cardiovascular: neg Ear/Nose/Throat: neg  Skin: neg Eyes: Blurred vision, light sensitivity Respiratory: Shortness of breath Gastroitestinal: neg  Hematology/Lymphatic: neg  Endocrine: neg Musculoskeletal: Back pain, gait abnormality Allergy/Immunology: neg Neurological: neg Psychiatric: neg Sleep : neg   ALLERGIES: No Known Allergies  HOME MEDICATIONS:   PAST MEDICAL HISTORY: Past Medical History:  Diagnosis Date   Boils    Glaucoma    Heart murmur    HTN (hypertension)    Hx of adenomatous colonic polyps    Hyperlipidemia    Osteoporosis    Pneumonia    Stroke ([place])    Type II or unspecified type diabetes mellitus without mention of complication, uncontrolled     PAST SURGICAL HISTORY: Past Surgical History:  Procedure Laterality Date   ABDOMINAL HYSTERECTOMY     CARPAL TUNNEL RELEASE     right   COLONOSCOPY  12-10-10   per Dr. Deatra Ina, clear, repeat in 7 yrs    [place]  N/A 09/26/2015   Procedure: IRRIGATION AND DEBRIDEMENT PERIRECTAL ABSCESS;  Surgeon: Mickeal Skinner, MD;  Location: [place];  Service: General;  Laterality: N/A;   KNEE ARTHROSCOPY     right   LOOP RECORDER INSERTION N/A 10/19/2016   Procedure: LOOP RECORDER INSERTION;  Surgeon: Thompson Grayer, MD;  Location: [place] [facility];  Service: Cardiovascular;  Laterality: N/A;   ORIF ANKLE FRACTURE Right 03/24/2012   Procedure: OPEN REDUCTION INTERNAL FIXATION (ORIF) ANKLE FRACTURE;  Surgeon: Newt Minion, MD;  Location: [place];  Service: Orthopedics;  Laterality: Right;  Open Reduction Internal Fixation Right Bimalleolar ankle fracture   POLYPECTOMY     TEE WITHOUT CARDIOVERSION N/A 10/18/2016   Procedure: TRANSESOPHAGEAL ECHOCARDIOGRAM (TEE);  Surgeon: Fay Records, MD;  Location: [facility] ENDOSCOPY;  Service: Cardiovascular;  Laterality: N/A;   TONSILLECTOMY      FAMILY HISTORY: Family History  Problem Relation Age of Onset   Diabetes Mother    Hypertension Mother    Stroke Father    Stroke Maternal Uncle    Stroke Paternal Uncle    Colon  cancer Neg Hx    Esophageal cancer Neg Hx    Rectal cancer Neg Hx    Stomach cancer Neg Hx     SOCIAL HISTORY: Social History   Socioeconomic History   Marital status: Single    Spouse name: Not on file   Number of children: Not on file   Years of education: Not on file   Highest education level: Not on file  Occupational History   Not on file  Social Needs   Financial resource strain: Not on file   Food insecurity:    Worry: Not on file    Inability: Not on file   Transportation needs:    Medical: Not on file    Non-medical: Not on file  Tobacco Use   Smoking status: Former Smoker    Types: Cigarettes    Last attempt to quit: 10/15/2016    Years since quitting: 1.4   Smokeless tobacco: Never Used   Tobacco comment: smokes occ.   Substance and Sexual Activity   Alcohol use: Yes    Alcohol/week: 0.0 standard  drinks    Comment: occ   Drug use: No   Sexual activity: Not on file  Lifestyle   Physical activity:    Days per week: Not on file    Minutes per session: Not on file   Stress: Not on file  Relationships   Social connections:    Talks on phone: Not on file    Gets together: Not on file    Attends religious service: Not on file    Active member of club or organization: Not on file    Attends meetings of clubs or organizations: Not on file    Relationship status: Not on file   Intimate partner violence:    Fear of current or ex partner: Not on file    Emotionally abused: Not on file    Physically abused: Not on file    Forced sexual activity: Not on file  Other Topics Concern   Not on file  Social History Narrative   Not on file     PHYSICAL EXAM  Vitals:   03/23/18 0715  BP: (!) 146/87  Pulse: 91  Weight: 148 lb (67.1 kg)  Height: 5' (1.524 m)   Body mass index is 28.9 kg/m.  Generalized: Well developed, in no acute distress  Head: normocephalic and atraumatic,. Oropharynx benign  Neck: Supple, no carotid bruits  Cardiac: Regular rate rhythm, no murmur  Musculoskeletal: No deformity   Neurological examination   Mentation: Alert oriented to time, place, history taking. Attention span and concentration appropriate. Recent and remote memory intact.  Follows all commands speech and language fluent.   Cranial nerve II-XII: Pupils were equal round reactive to light extraocular movements were full, dense left homonymous hemianopsia.  No  facial droop . hearing was intact to finger rubbing bilaterally. Uvula tongue midline. head turning and shoulder shrug were normal and symmetric.Tongue protrusion into cheek strength was normal. Motor: normal bulk and tone, full strength in the BUE, BLE,  . Sensory: normal and symmetric to light touch, pinprick, and  Vibration, in the upper and lower extremities Coordination: finger-nose-finger, heel-to-shin bilaterally, no  dysmetria, no tremor Reflexes: 1+ upper lower and symmetric plantar responses were flexor bilaterally. Gait and Station: Rising up from seated position without assistance, wide-based stance, ambulates with a rolling walker no difficulty with turns  DIAGNOSTIC DATA (LABS, IMAGING, TESTING) - I reviewed patient records, labs, notes, testing and imaging myself  where available.  Lab Results  Component Value Date   WBC 6.3 02/14/2018   HGB 11.7 (L) 02/14/2018   HCT 34.8 (L) 02/14/2018   MCV 92.8 02/14/2018   PLT 377.0 02/14/2018      Component Value Date/Time   NA 141 02/14/2018 1224   K 4.2 02/14/2018 1224   CL 107 02/14/2018 1224   CO2 28 02/14/2018 1224   GLUCOSE 93 02/14/2018 1224   BUN 16 02/14/2018 1224   CREATININE 0.53 02/14/2018 1224   CALCIUM 9.6 02/14/2018 1224   PROT 6.5 02/08/2018 1143   ALBUMIN 2.8 (L) 02/08/2018 1143   AST 14 (L) 02/08/2018 1143   ALT 10 02/08/2018 1143   ALKPHOS 74 02/08/2018 1143   BILITOT 0.4 02/08/2018 1143   GFRNONAA >60 02/08/2018 1143   GFRAA >60 02/08/2018 1143   Lab Results  Component Value Date   CHOL 152 02/09/2018   HDL 49 02/09/2018   LDLCALC 76 02/09/2018   TRIG 134 02/09/2018   CHOLHDL 3.1 02/09/2018   Lab Results  Component Value Date   HGBA1C 13.5 (H) 12/28/2017    CT head R parietal watershed infarct. Small vessel disease.   MRI  Acute cortical and subcortical R P-O infarcts. Old R CR and R frontal lobe infarcts. Small vessel disease.   MRA head  No LVO. Focal stenosis R M1, R M2/M3, L M1  MRA neck B VA origin atherosclerosis 30-50%  2D Echo   EF 60 to 65%   Loop interrogation - last check 1/3 w/o AF - interrogation today without AF  LDL 76  HgbA1c 13.5      ASSESSMENT AND PLAN    Ms. BRITTIANY WIEHE is a 73 y.o. female with history of hypertension, hyperlipidemia, prior right MCA stroke with no gross deficits but very minimal left hand fine motor deficits, diabetes presenting to the hospital  02/08/2018 with left homonymous hemianopsia, MRI cortical and subcortical RPO infarcts. The patient was seen by Dr. Erlinda Hong in the hospital stroke service.      Plan:  Continue plavix and aspirin for 3 months then plavix alone for  for stroke prevention Continue lipitor for stroke prevention Check BP and glucose at home and record, Today s B/P reading 146/87 continue meds Follow up with your primary care physician for stroke risk factor modification. Recommend maintain blood pressure goal <130/80, diabetes with hemoglobin A1c goal below 7.0% and lipids with LDL cholesterol goal below 70 mg/dL.  Loop recorder so far no atrial fib Pt and OT to be started today Diabetic diet and regular exercise on off days Most recent HgbA1C 13.5  F/U in 4 months I spent 25 minutes in total face to face time with the patient more than 50% of which was spent counseling and coordination of care, reviewing test results reviewing medications and discussing and reviewing the diagnosis of stroke and management of risk factors and further treatment options.  No driving until further notice Dennie Bible, [facility], [facility], APRN  [facility] [address], [place], [place] [address] [phone]

## 2018-03-22 NOTE — Therapy (Addendum)
[facility] [address] [place], [place], [address] Phone: [phone]   Fax:  [phone]  Patient Details  Name: Sarah [name] MRN: [id number] Date of Birth: 1945/05/25 Referring Provider:  [name], [name], MD  Encounter Date: 03/22/2018  ST - ARRIVED/NO CHARGE  Upon arrival pt stated to SLP she was unsure why she was referred for ST as she has not had any changes in her speech/language since new CVA. SLP looked at her d/c note from March 2019 and spoke with pt for approx 10 minutes. After this time, SLP agreed that pt's speech/language appears commensurate with her skills at d/c. Evaluation was cancelled, at pt's request. SLP agrees. Pt was told should she require ST services in the future, she should speak with her PCP.  [facility] ,[place], [place]  03/22/2018, 9:48 AM  [facility] [address] [place], [place], [address] Phone: [phone]   Fax:  [phone]

## 2018-03-23 ENCOUNTER — Encounter: Payer: Self-pay | Admitting: Occupational Therapy

## 2018-03-23 ENCOUNTER — Encounter: Payer: Self-pay | Admitting: Physical Therapy

## 2018-03-23 ENCOUNTER — Encounter: Payer: Self-pay | Admitting: Nurse Practitioner

## 2018-03-23 ENCOUNTER — Ambulatory Visit: Payer: HMO | Admitting: Occupational Therapy

## 2018-03-23 ENCOUNTER — Other Ambulatory Visit: Payer: Self-pay

## 2018-03-23 ENCOUNTER — Ambulatory Visit: Payer: HMO | Admitting: Physical Therapy

## 2018-03-23 ENCOUNTER — Ambulatory Visit: Payer: HMO | Admitting: Nurse Practitioner

## 2018-03-23 VITALS — BP 137/77 | HR 87

## 2018-03-23 VITALS — BP 146/87 | HR 91 | Ht 60.0 in | Wt 148.0 lb

## 2018-03-23 DIAGNOSIS — M6281 Muscle weakness (generalized): Secondary | ICD-10-CM

## 2018-03-23 DIAGNOSIS — E785 Hyperlipidemia, unspecified: Secondary | ICD-10-CM | POA: Diagnosis not present

## 2018-03-23 DIAGNOSIS — I10 Essential (primary) hypertension: Secondary | ICD-10-CM

## 2018-03-23 DIAGNOSIS — R2681 Unsteadiness on feet: Secondary | ICD-10-CM

## 2018-03-23 DIAGNOSIS — R41842 Visuospatial deficit: Secondary | ICD-10-CM

## 2018-03-23 DIAGNOSIS — I63411 Cerebral infarction due to embolism of right middle cerebral artery: Secondary | ICD-10-CM | POA: Diagnosis not present

## 2018-03-23 DIAGNOSIS — H53462 Homonymous bilateral field defects, left side: Secondary | ICD-10-CM

## 2018-03-23 DIAGNOSIS — R2689 Other abnormalities of gait and mobility: Secondary | ICD-10-CM

## 2018-03-23 DIAGNOSIS — R278 Other lack of coordination: Secondary | ICD-10-CM

## 2018-03-23 NOTE — Patient Instructions (Addendum)
Continue plavix and aspirin for 3 months then plavix alone for  for stroke prevention Continue lipitor for stroke prevention Check BP and glucose at home and record, Today s B/P reading 128/62 continue meds Follow up with your primary care physician for stroke risk factor modification. Recommend maintain blood pressure goal <130/80, diabetes with hemoglobin A1c goal below 7.0% and lipids with LDL cholesterol goal below 70 mg/dL.  Loop recorder so far no atrial fib Pt and OT to be started today Diabetic diet and regular exercise on off days Most recent HgbA1C 13.5  F/U in 3 months

## 2018-03-23 NOTE — Patient Instructions (Signed)
Here are your eye exercises. DO NOT DO MORE THAN I TELL YOU or you will get a headache.  Keep your head still and just move your eyes. Keep your eyes on the target.  Move the target SLOWLY!!   1. Hold a small bright object in your hand and cover one eye. SLOWLY move the object side to side, then up and down and then in diagonals (both directions).  Do 5 follows in each direction.  Do one eye and then the other - DO NOT DO [place]. Rest between each eye.Do these 3 times a day - after each meal.   3. Use your object and move it until it JUST starts to show the object with a second shadow. Try and make it into 1 image. Do up, down, and each side.  Do each direction 2 times.  Do 3 times per day.    STOP as soon as you start to feel a headache and rest your eyes.  Don't wait for the headache to get bad.  Don't start again until the headache is gone.

## 2018-03-23 NOTE — Therapy (Signed)
[facility] [address] [place], [place], [address] Phone: [phone]   Fax:  [phone]  Occupational Therapy Treatment  Patient Details  Name: Sarah [name] MRN: [id number] Date of Birth: Jun 23, 1945 Referring Provider (OT): Betty [name]   Encounter Date: 03/23/2018  OT End of Session - 03/23/18 1210    Visit Number  2    Number of Visits  9    Date for OT Re-Evaluation  04/14/18    Authorization Type  [employer] Medicare    Authorization Time Period  90 days    Authorization - Visit Number  2    Authorization - Number of Visits  10    OT Start Time  1101    OT Stop Time  1147    OT Time Calculation (min)  46 min    Activity Tolerance  Patient tolerated treatment well       Past Medical History:  Diagnosis Date  . Boils   . Glaucoma   . Heart murmur   . HTN (hypertension)   . Hx of adenomatous colonic polyps   . Hyperlipidemia   . Osteoporosis   . Pneumonia   . Stroke ([place])   . Type II or unspecified type diabetes mellitus without mention of complication, uncontrolled     Past Surgical History:  Procedure Laterality Date  . ABDOMINAL HYSTERECTOMY    . CARPAL TUNNEL RELEASE     right  . COLONOSCOPY  12-10-10   per Dr. Deatra Ina, clear, repeat in 7 yrs   . INCISION AND DRAINAGE PERIRECTAL ABSCESS N/A 09/26/2015   Procedure: IRRIGATION AND DEBRIDEMENT PERIRECTAL ABSCESS;  Surgeon: Mickeal Skinner, MD;  Location: [place];  Service: General;  Laterality: N/A;  . KNEE ARTHROSCOPY     right  . LOOP RECORDER INSERTION N/A 10/19/2016   Procedure: LOOP RECORDER INSERTION;  Surgeon: Thompson Grayer, MD;  Location: [place] [facility];  Service: Cardiovascular;  Laterality: N/A;  . ORIF ANKLE FRACTURE Right 03/24/2012   Procedure: OPEN REDUCTION INTERNAL FIXATION (ORIF) ANKLE FRACTURE;  Surgeon: Newt Minion, MD;  Location: [place];  Service: Orthopedics;  Laterality: Right;  Open Reduction Internal Fixation Right  Bimalleolar ankle fracture  . POLYPECTOMY    . TEE WITHOUT CARDIOVERSION N/A 10/18/2016   Procedure: TRANSESOPHAGEAL ECHOCARDIOGRAM (TEE);  Surgeon: Fay Records, MD;  Location: [facility] ENDOSCOPY;  Service: Cardiovascular;  Laterality: N/A;  . TONSILLECTOMY      There were no vitals filed for this visit.  Subjective Assessment - 03/23/18 1159    Subjective   I get a headache sometimes    Pertinent History  Pt s/p R parietal-occipital, PICA watershed and R frontal CVA's, mutli infarct underlying ischemic disease. history of stroke in 2018- nearly full recovery;     Patient Stated Goals  get my vision and walking better    Currently in Pain?  No/denies                   OT Treatments/Exercises (OP) - 03/23/18 0001      ADLs   ADL Comments  Reviewed goals and POC - pt in agreement with both. Pt given written copy.        Visual/Perceptual Exercises   Other Exercises  Further assessed vision (diplopia, smooth pursuits)  as well as visual vestibular integration. Pt with diplopia in all fields approximately 20* in all directions. Pt does have the ability to begin to work on convergence right at start of  symptoms.  Pt also with impaired gaze stabilization, near worse than far (improves at 15 feet) with horizontal head movements worse than vertical. Pt also with LOB with walking with head turns L and R, pt 's balance more impaired with repetition. Pt self limiiting head movement at this time (she was unaware). Pt very aware of L hemianopsia - educated pt on what was happening visually and pt verblized understanding.  Pt issued beginning HEP for vision with instructions to stop HEP and rest as soon as she becomes symptomatic for headache. See pt instruction for details.              OT Education - 03/23/18 1207    Education Details  HEP for double vision, beginning convergence activity, OT POC and goals    Person(s) Educated  Patient    Methods  Explanation;Demonstration;Verbal  cues;Handout    Comprehension  Verbalized understanding;Returned demonstration       OT Short Term Goals - 03/15/18 1051      OT SHORT TERM GOAL #1   Title  XXXXX      OT SHORT TERM GOAL #2   Title  XXXXX      OT SHORT TERM GOAL #3   Title  Technical brewer      OT SHORT TERM GOAL #4   Title  [id number]      OT SHORT TERM GOAL #5   Title  [id number]        OT Long Term Goals - 03/23/18 1207      OT LONG TERM GOAL #1   Title  Pt will be mod I with upgraded HEP for R/L Coordination due 04/14/18    Time  4    Period  Weeks    Status  On-going      OT LONG TERM GOAL #2   Title  Pt will demonstrate improved grip strength to at least 35 lbs R/L to assist with opening bottles or packages.      Baseline  R:  16LB, L: 25LB    Time  4    Period  Weeks    Status  On-going      OT LONG TERM GOAL #3   Title  Pt will demonstrate improved coordination as evidenced by decreasing time on 9 hole peg test to under 30 seconds in both hands to aide with typing, maipulating change/paper money, etc.      Baseline  R:  29.88, L:  37.87    Time  4    Period  Weeks    Status  On-going      OT LONG TERM GOAL #4   Title  Patient will navigate in moderately busy environment (e.g. grocery store) without hitting obstacles per patient's report    Time  4    Period  Weeks    Status  On-going      OT LONG TERM GOAL #5   Title  Patient will demonstate compensatory strategies that will allow her to comfortably read for pleasure for 10-15 min at a time without eye strain, headache.      Time  4    Period  Weeks    Status  On-going            Plan - 03/23/18 1208    Clinical Impression Statement  Pt in agreement with OT goals and POC. Pt progressing toward goals.     Occupational performance deficits (Please refer to evaluation for details):  ADL's;IADL's;Leisure    Body  Structure / Function / Physical Skills  ADL;Coordination;Endurance;GMC;UE functional use;Balance;Vestibular;Vision;Pain;IADL;Decreased  knowledge of use of DME;Dexterity;FMC;Strength    Comorbidities Affecting Occupational Performance:  Presence of comorbidities impacting occupational performance    Comorbidities impacting occupational performance description:  prior stroke with residual left sided hemiparesis, diabetes, hypertension    OT Frequency  2x / week    OT Duration  4 weeks    OT Treatment/Interventions  Self-care/ADL training;Therapeutic exercise;Visual/perceptual remediation/compensation;Patient/family education;Neuromuscular education;Balance training;Therapeutic activities;Functional Mobility Training;DME and/or AE instruction    Plan  check vision HEP, add if possible.  tabletop and environmental, add HEP putty for both hands, coordination HEP for both hands    Consulted and Agree with Plan of Care  Patient       Patient will benefit from skilled therapeutic intervention in order to improve the following deficits and impairments:     Visit Diagnosis: Visuospatial deficit  Other lack of coordination  Muscle weakness (generalized)  Homonymous bilateral field defects, left side  Unsteadiness on feet    Problem List Patient Active Problem List   Diagnosis Date Noted  . Homonymous hemianopia, left 02/08/2018  . HTN (hypertension)   . Trigger finger, right index finger 10/17/2017  . Low back pain 07/29/2017  . Carotid artery stenosis 11/02/2016  . Left arm weakness   . Diastolic dysfunction   . Hypertension with heart disease   . Tobacco abuse   . Acute blood loss anemia   . Cerebral infarction due to embolism of right middle cerebral artery ([place])   . Perirectal abscess 09/26/2015  . Vitamin D deficiency 07/23/2015  . Osteopenia 02/16/2013  . Ankle fracture 01/19/2013  . Anemia 09/27/2011  . Type II diabetes mellitus with nephropathy ([place]) 09/24/2011  . Hyperlipidemia 09/24/2011    Quay Burow, OTR/L 03/23/2018, 12:14 PM  [facility] [address] [place], [place], [address] Phone: [phone]   Fax:  [phone]  Name: Sarah [name] MRN: [id number] Date of Birth: 02/26/1945

## 2018-03-23 NOTE — Patient Instructions (Signed)
Access Code: [email or web address]  URL: [email or web address]  Date: 03/23/2018  Prepared by: Misty Stanley   Exercises  Walking March - 10 reps - 4 sets - 2x daily - 7x weekly  Standing Single Leg Stance with Unilateral Counter Support - 3 sets - 10 second hold - 2x daily - 7x weekly  Walking Tandem Stance - 10 reps - 4 sets - 1x daily - 7x weekly

## 2018-03-23 NOTE — Progress Notes (Signed)
I agree with the above plan 

## 2018-03-23 NOTE — Therapy (Signed)
[facility] [address] [place], [place], [address] Phone: [phone]   Fax:  [phone]  Physical Therapy Treatment  Patient Details  Name: Sarah [name] MRN: [id number] Date of Birth: 06/27/1945 Referring Provider (PT): Betty [name], MD   Encounter Date: 03/23/2018  PT End of Session - 03/23/18 1428    Visit Number  2    Number of Visits  9    Date for PT Re-Evaluation  04/19/18    Authorization Type  UHC Medicare    PT Start Time  1020    PT Stop Time  1104    PT Time Calculation (min)  44 min    Activity Tolerance  Patient tolerated treatment well    Behavior During Therapy  [facility] for tasks assessed/performed       Past Medical History:  Diagnosis Date  . Boils   . Glaucoma   . Heart murmur   . HTN (hypertension)   . Hx of adenomatous colonic polyps   . Hyperlipidemia   . Osteoporosis   . Pneumonia   . Stroke ([place])   . Type II or unspecified type diabetes mellitus without mention of complication, uncontrolled     Past Surgical History:  Procedure Laterality Date  . ABDOMINAL HYSTERECTOMY    . CARPAL TUNNEL RELEASE     right  . COLONOSCOPY  12-10-10   per Dr. Deatra Ina, clear, repeat in 7 yrs   . INCISION AND DRAINAGE PERIRECTAL ABSCESS N/A 09/26/2015   Procedure: IRRIGATION AND DEBRIDEMENT PERIRECTAL ABSCESS;  Surgeon: Mickeal Skinner, MD;  Location: [place] [facility];  Service: General;  Laterality: N/A;  . KNEE ARTHROSCOPY     right  . LOOP RECORDER INSERTION N/A 10/19/2016   Procedure: LOOP RECORDER INSERTION;  Surgeon: Thompson Grayer, MD;  Location: [place] [facility];  Service: Cardiovascular;  Laterality: N/A;  . ORIF ANKLE FRACTURE Right 03/24/2012   Procedure: OPEN REDUCTION INTERNAL FIXATION (ORIF) ANKLE FRACTURE;  Surgeon: Newt Minion, MD;  Location: [place];  Service: Orthopedics;  Laterality: Right;  Open Reduction Internal Fixation Right Bimalleolar ankle fracture  . POLYPECTOMY    . TEE  WITHOUT CARDIOVERSION N/A 10/18/2016   Procedure: TRANSESOPHAGEAL ECHOCARDIOGRAM (TEE);  Surgeon: Fay Records, MD;  Location: [facility] ENDOSCOPY;  Service: Cardiovascular;  Laterality: N/A;  . TONSILLECTOMY      Vitals:   03/23/18 1030  BP: 137/77  Pulse: 87    Subjective Assessment - 03/23/18 1029    Subjective  Just came from Neurology appointment.  Has rollator now but it was not sized to her and is having a hard time folding it.    Pertinent History  DM2, CVA, HTN, osteoporosis, glaucoma, L visual field deficits    Patient Stated Goals  "I need to get strong"    Currently in Pain?  No/denies                       [facility] Adult PT Treatment/Exercise - 03/23/18 1030      Ambulation/Gait   Ambulation/Gait  Yes    Ambulation/Gait Assistance  5: Supervision    Ambulation/Gait Assistance Details  cues for safe ambulation with rollator    Ambulation Distance (Feet)  230 Feet    Assistive device  Rollator    Gait Pattern  Step-through pattern    Ambulation Surface  Level;Indoor    Ramp  5: Supervision    Ramp Details (indicate cue type and reason)  with  rollator and verbal cues for safe sequence - use of brakes to slow descent down ramp and lifting front wheels over edge when ascending    Curb  4: Min assist    Curb Details (indicate cue type and reason)  with rollator x 4 reps with verbal cues for safety and sequencing      Therapeutic Activites    Therapeutic Activities  Other Therapeutic Activities    Other Therapeutic Activities  Attempted to adjust rollator to proper height but unable to lower any further.  Demonstrated to pt safe method for folding rollator and placing it into trunk by reaching down and lifting from lower bar, not strap.  Pt return demonstrated safer sequence x 2 with supervision-min A        Access Code: [email or web address]  URL: [email or web address]  Date: 03/23/2018  Prepared by: Misty Stanley   Exercises  Walking March - 10 reps - 4 sets  - 2x daily - 7x weekly  Standing Single Leg Stance with Unilateral Counter Support - 3 sets - 10 second hold - 2x daily - 7x weekly  Walking Tandem Stance - 10 reps - 4 sets - 1x daily - 7x weekly        PT Education - 03/23/18 1428    Education Details  gait training with rollator, initiated balance HEP    Person(s) Educated  Patient    Methods  Explanation;Demonstration;Handout    Comprehension  Verbalized understanding;Returned demonstration       PT Short Term Goals - 03/15/18 1124      PT SHORT TERM GOAL #1   Title  same as LTGs        PT Long Term Goals - 03/15/18 1124      PT LONG TERM GOAL #1   Title  patient to be independent with advanced HEP for balance and strength    Time  4    Status  New    Target Date  04/19/18      PT LONG TERM GOAL #2   Title  Patient to improve Berg to >/= 50/56 demonstrating reduced fall risk    Baseline  44/56    Time  4    Period  Weeks    Status  New    Target Date  04/19/18      PT LONG TERM GOAL #3   Title  patient to improve FGA to >/= 24/30 demonstrating improved dynamic balance    Baseline  19/30    Time  4    Status  New    Target Date  04/19/18      PT LONG TERM GOAL #4   Title  patient to improve gait speed to >/= 3.5 ft/ sec for improved community navigation and safety    Baseline  2.74 ft/sec    Time  4    Period  Weeks    Status  New    Target Date  04/19/18      PT LONG TERM GOAL #5   Title  patient to ambulate over various surfaces (even and uneven) as well as up/down inclines without LOB or instability with LRAD for >1000 feet    Time  4    Period  Weeks    Status  New    Target Date  04/19/18            Plan - 03/23/18 1429    Clinical Impression Statement  Initiated gait training with rollator on ramp and curb to  simulate community mobility with verbal cues for safety and sequence and attention to location/placement of wheels due to vision.  Also educated pt on safer method for folding and  loading rollator into car.  Initiated standing balance and LE strengthening HEP at counter top.  Will continue to address and progress towards LTG with attention to how visual/coordination impairments affect mobility.      Personal Factors and Comorbidities  Age;Transportation;Comorbidity 3+    Comorbidities  HTN, CVA, DM2, currently unable to drive, 73 y/o    Examination-Activity Limitations  Stairs;Locomotion Level    Examination-Participation Restrictions  Community Activity;Driving;Shop    Stability/Clinical Decision Making  Evolving/Moderate complexity    Rehab Potential  Good    PT Frequency  2x / week    PT Duration  4 weeks    PT Treatment/Interventions  ADLs/Self Care Home Management;Electrical Stimulation;DME Instruction;Gait training;Stair training;Functional mobility training;Patient/family education;Neuromuscular re-education;Balance training;Therapeutic exercise;Therapeutic activities;Manual techniques;Visual/perceptual remediation/compensation;Vestibular    PT Next Visit Plan  continue to add to HEP.  May need vestibular eval due to location of infarct.    Recommended Other Services  Per OT: pt had watershed (bilat) infarcts in PICA.  Has double vision in ALL visual fields.  Better at a distance, worse close up.  Also loses her balance with head turns and gets pretty bad headaches when she overdoes it (she won't report so ask!).   May need to work on gaze stability once double vision is better    Consulted and Agree with Plan of Care  Patient       Patient will benefit from skilled therapeutic intervention in order to improve the following deficits and impairments:  Abnormal gait, Decreased balance, Decreased strength, Decreased knowledge of use of DME, Difficulty walking  Visit Diagnosis: Muscle weakness (generalized)  Unsteadiness on feet  Other abnormalities of gait and mobility     Problem List Patient Active Problem List   Diagnosis Date Noted  . Homonymous  hemianopia, left 02/08/2018  . HTN (hypertension)   . Trigger finger, right index finger 10/17/2017  . Low back pain 07/29/2017  . Carotid artery stenosis 11/02/2016  . Left arm weakness   . Diastolic dysfunction   . Hypertension with heart disease   . Tobacco abuse   . Acute blood loss anemia   . Cerebral infarction due to embolism of right middle cerebral artery ([place])   . Perirectal abscess 09/26/2015  . Vitamin D deficiency 07/23/2015  . Osteopenia 02/16/2013  . Ankle fracture 01/19/2013  . Anemia 09/27/2011  . Type II diabetes mellitus with nephropathy ([place]) 09/24/2011  . Hyperlipidemia 09/24/2011    Rico Junker, PT, DPT 03/23/18    2:38 PM    [facility] [address] [place], [place], [address] Phone: [phone]   Fax:  [phone]  Name: Sarah [name] MRN: [id number] Date of Birth: 09-Jul-1945

## 2018-03-24 ENCOUNTER — Encounter: Payer: Self-pay | Admitting: Physical Therapy

## 2018-03-24 ENCOUNTER — Encounter: Payer: Self-pay | Admitting: Occupational Therapy

## 2018-03-24 ENCOUNTER — Ambulatory Visit: Payer: HMO | Admitting: Physical Therapy

## 2018-03-24 ENCOUNTER — Ambulatory Visit: Payer: HMO | Admitting: Occupational Therapy

## 2018-03-24 DIAGNOSIS — R2681 Unsteadiness on feet: Secondary | ICD-10-CM | POA: Diagnosis not present

## 2018-03-24 DIAGNOSIS — M6281 Muscle weakness (generalized): Secondary | ICD-10-CM

## 2018-03-24 DIAGNOSIS — H53462 Homonymous bilateral field defects, left side: Secondary | ICD-10-CM

## 2018-03-24 DIAGNOSIS — R2689 Other abnormalities of gait and mobility: Secondary | ICD-10-CM

## 2018-03-24 DIAGNOSIS — R278 Other lack of coordination: Secondary | ICD-10-CM

## 2018-03-24 DIAGNOSIS — R41842 Visuospatial deficit: Secondary | ICD-10-CM

## 2018-03-24 NOTE — Therapy (Signed)
[facility] [address] [place], [place], [address] Phone: [phone]   Fax:  [phone]  Occupational Therapy Treatment  Patient Details  Name: Sarah [name] MRN: [id number] Date of Birth: May 19, 1945 Referring Provider (OT): Betty [name]   Encounter Date: 03/24/2018  OT End of Session - 03/24/18 1605    Visit Number  3    Number of Visits  9    Date for OT Re-Evaluation  04/14/18    Authorization Type  UHC Medicare    Authorization Time Period  90 days    Authorization - Visit Number  3    Authorization - Number of Visits  10    OT Start Time  5009    OT Stop Time  1445    OT Time Calculation (min)  42 min    Activity Tolerance  Patient tolerated treatment well    Behavior During Therapy  [facility] for tasks assessed/performed       Past Medical History:  Diagnosis Date  . Boils   . Glaucoma   . Heart murmur   . HTN (hypertension)   . Hx of adenomatous colonic polyps   . Hyperlipidemia   . Osteoporosis   . Pneumonia   . Stroke ([place])   . Type II or unspecified type diabetes mellitus without mention of complication, uncontrolled     Past Surgical History:  Procedure Laterality Date  . ABDOMINAL HYSTERECTOMY    . CARPAL TUNNEL RELEASE     right  . COLONOSCOPY  12-10-10   per Dr. Deatra Ina, clear, repeat in 7 yrs   . INCISION AND DRAINAGE PERIRECTAL ABSCESS N/A 09/26/2015   Procedure: IRRIGATION AND DEBRIDEMENT PERIRECTAL ABSCESS;  Surgeon: Mickeal Skinner, MD;  Location: [facility];  Service: General;  Laterality: N/A;  . KNEE ARTHROSCOPY     right  . LOOP RECORDER INSERTION N/A 10/19/2016   Procedure: LOOP RECORDER INSERTION;  Surgeon: Thompson Grayer, MD;  Location: [facility];  Service: Cardiovascular;  Laterality: N/A;  . ORIF ANKLE FRACTURE Right 03/24/2012   Procedure: OPEN REDUCTION INTERNAL FIXATION (ORIF) ANKLE FRACTURE;  Surgeon: Newt Minion, MD;  Location: [facility];  Service:  Orthopedics;  Laterality: Right;  Open Reduction Internal Fixation Right Bimalleolar ankle fracture  . POLYPECTOMY    . TEE WITHOUT CARDIOVERSION N/A 10/18/2016   Procedure: TRANSESOPHAGEAL ECHOCARDIOGRAM (TEE);  Surgeon: Fay Records, MD;  Location: [facility] ENDOSCOPY;  Service: Cardiovascular;  Laterality: N/A;  . TONSILLECTOMY      There were no vitals filed for this visit.  Subjective Assessment - 03/24/18 1406    Subjective   Patient more aware of hed turns and balance issues    Pertinent History  Pt s/p R parietal-occipital, PICA watershed and R frontal CVA's, mutli infarct underlying ischemic disease. history of stroke in 2018- nearly full recovery;     Patient Stated Goals  get my vision and walking better    Currently in Pain?  No/denies    Multiple Pain Sites  No                   OT Treatments/Exercises (OP) - 03/24/18 0001      Visual/Perceptual Exercises   Other Exercises  Reviewed and practiced recent vision exercises for home program.  Patient needed cueing for convergence exercise- once reviewed, able to complete effectively.  Patient reporting difficulty reading computer screen - worked with her for Zoom function, and increasing font.  Also to utilize  compensatory strategy to help reduce diplopia.        Fine Motor Coordination (Hand/Wrist)   Fine Motor Coordination  Manipulating coins;Stacking coins;Flipping cards;Picking up coins;In hand manipuation training   Difficulty with in hand manipulation            OT Education - 03/24/18 1604    Education Details  fine motor coordination HEP    Person(s) Educated  Patient    Methods  Explanation;Demonstration;Handout;Verbal cues    Comprehension  Verbalized understanding;Returned demonstration;Need further instruction       OT Short Term Goals - 03/15/18 1051      OT SHORT TERM GOAL #1   Title  XXXXX      OT SHORT TERM GOAL #2   Title  XXXXX      OT SHORT TERM GOAL #3   Title  Technical brewer      OT  SHORT TERM GOAL #4   Title  [id number]      OT SHORT TERM GOAL #5   Title  [id number]        OT Long Term Goals - 03/23/18 1207      OT LONG TERM GOAL #1   Title  Pt will be mod I with upgraded HEP for R/L Coordination due 04/14/18    Time  4    Period  Weeks    Status  On-going      OT LONG TERM GOAL #2   Title  Pt will demonstrate improved grip strength to at least 35 lbs R/L to assist with opening bottles or packages.      Baseline  R:  16LB, L: 25LB    Time  4    Period  Weeks    Status  On-going      OT LONG TERM GOAL #3   Title  Pt will demonstrate improved coordination as evidenced by decreasing time on 9 hole peg test to under 30 seconds in both hands to aide with typing, maipulating change/paper money, etc.      Baseline  R:  29.88, L:  37.87    Time  4    Period  Weeks    Status  On-going      OT LONG TERM GOAL #4   Title  Patient will navigate in moderately busy environment (e.g. grocery store) without hitting obstacles per patient's report    Time  4    Period  Weeks    Status  On-going      OT LONG TERM GOAL #5   Title  Patient will demonstate compensatory strategies that will allow her to comfortably read for pleasure for 10-15 min at a time without eye strain, headache.      Time  4    Period  Weeks    Status  On-going            Plan - 03/24/18 1605    Clinical Impression Statement  Patient is beginning to utilize HEP to address visual motor skills, working both on remediation as well as compensation for visual abilities during functional activities    OT Occupational Profile and History  Detailed Assessment- Review of Records and additional review of physical, cognitive, psychosocial history related to current functional performance    Occupational performance deficits (Please refer to evaluation for details):  ADL's;IADL's;Leisure    Body Structure / Function / Physical Skills  ADL;Coordination;Endurance;GMC;UE functional  use;Balance;Vestibular;Vision;Pain;IADL;Decreased knowledge of use of DME;Dexterity;FMC;Strength    Rehab Potential  Good    Clinical  Decision Making  Several treatment options, min-mod task modification necessary    Comorbidities Affecting Occupational Performance:  Presence of comorbidities impacting occupational performance    Comorbidities impacting occupational performance description:  prior stroke with residual left sided hemiparesis, diabetes, hypertension    Modification or Assistance to Complete Evaluation   Min-Moderate modification of tasks or assist with assess necessary to complete eval    OT Frequency  2x / week    OT Duration  4 weeks    OT Treatment/Interventions  Self-care/ADL training;Therapeutic exercise;Visual/perceptual remediation/compensation;Patient/family education;Neuromuscular education;Balance training;Therapeutic activities;Functional Mobility Training;DME and/or AE instruction    Plan  HEP Putty for BUE (She has green putty at home)  Check coord exercises, work on [employer] to help use of computer, tabletop and environmental visual scanning    Recommended Other Services  Neurooptometry if condition persists    Consulted and Agree with Plan of Care  Patient       Patient will benefit from skilled therapeutic intervention in order to improve the following deficits and impairments:  Body Structure / Function / Physical Skills  Visit Diagnosis: Muscle weakness (generalized)  Unsteadiness on feet  Visuospatial deficit  Other lack of coordination  Homonymous bilateral field defects, left side    Problem List Patient Active Problem List   Diagnosis Date Noted  . Homonymous hemianopia, left 02/08/2018  . HTN (hypertension)   . Trigger finger, right index finger 10/17/2017  . Low back pain 07/29/2017  . Carotid artery stenosis 11/02/2016  . Left arm weakness   . Diastolic dysfunction   . Hypertension with heart disease   . Tobacco abuse   . Acute  blood loss anemia   . Cerebral infarction due to embolism of right middle cerebral artery ([place])   . Perirectal abscess 09/26/2015  . Vitamin D deficiency 07/23/2015  . Osteopenia 02/16/2013  . Ankle fracture 01/19/2013  . Anemia 09/27/2011  . Type II diabetes mellitus with nephropathy ([place]) 09/24/2011  . Hyperlipidemia 09/24/2011    [name], OTR/L 03/24/2018, 4:09 PM  [facility] [address] [place], [place], [address] Phone: [phone]   Fax:  [phone]  Name: Sarah [name] MRN: [id number] Date of Birth: [birth year]

## 2018-03-24 NOTE — Therapy (Signed)
[facility] [address] [place], [place], [address] Phone: [phone]   Fax:  [phone]  Physical Therapy Treatment  Patient Details  Name: Sarah [name] MRN: [id number] Date of Birth: Sep 09, 1945 Referring Provider (PT): Betty [name], MD   Encounter Date: 03/24/2018  PT End of Session - 03/24/18 1622    Visit Number  3    Date for PT Re-Evaluation  04/19/18    Authorization Type  UHC Medicare    PT Start Time  2094    PT Stop Time  1529    PT Time Calculation (min)  42 min    Activity Tolerance  Patient tolerated treatment well    Behavior During Therapy  [facility] for tasks assessed/performed       Past Medical History:  Diagnosis Date  . Boils   . Glaucoma   . Heart murmur   . HTN (hypertension)   . Hx of adenomatous colonic polyps   . Hyperlipidemia   . Osteoporosis   . Pneumonia   . Stroke ([place])   . Type II or unspecified type diabetes mellitus without mention of complication, uncontrolled     Past Surgical History:  Procedure Laterality Date  . ABDOMINAL HYSTERECTOMY    . CARPAL TUNNEL RELEASE     right  . COLONOSCOPY  12-10-10   per Dr. Deatra Ina, clear, repeat in 7 yrs   . INCISION AND DRAINAGE PERIRECTAL ABSCESS N/A 09/26/2015   Procedure: IRRIGATION AND DEBRIDEMENT PERIRECTAL ABSCESS;  Surgeon: Mickeal Skinner, MD;  Location: [facility];  Service: General;  Laterality: N/A;  . KNEE ARTHROSCOPY     right  . LOOP RECORDER INSERTION N/A 10/19/2016   Procedure: LOOP RECORDER INSERTION;  Surgeon: Thompson Grayer, MD;  Location: [place] [facility];  Service: Cardiovascular;  Laterality: N/A;  . ORIF ANKLE FRACTURE Right 03/24/2012   Procedure: OPEN REDUCTION INTERNAL FIXATION (ORIF) ANKLE FRACTURE;  Surgeon: Newt Minion, MD;  Location: [facility];  Service: Orthopedics;  Laterality: Right;  Open Reduction Internal Fixation Right Bimalleolar ankle fracture  . POLYPECTOMY    . TEE WITHOUT CARDIOVERSION N/A  10/18/2016   Procedure: TRANSESOPHAGEAL ECHOCARDIOGRAM (TEE);  Surgeon: Fay Records, MD;  Location: [facility] ENDOSCOPY;  Service: Cardiovascular;  Laterality: N/A;  . TONSILLECTOMY      There were no vitals filed for this visit.  Subjective Assessment - 03/24/18 1622    Subjective  doing well - sister is helping her some; feels well with ambulating with rollator    Pertinent History  DM2, CVA, HTN, osteoporosis, glaucoma, L visual field deficits    Patient Stated Goals  "I need to get strong"    Currently in Pain?  No/denies    Multiple Pain Sites  No                       [facility] Adult PT Treatment/Exercise - 03/24/18 0001      Ambulation/Gait   Ambulation/Gait  Yes    Ambulation/Gait Assistance  5: Supervision    Ambulation/Gait Assistance Details  working on head turns with gait as well as navigating cones    Ambulation Distance (Feet)  200 Feet    Assistive device  Rollator    Gait Pattern  Step-through pattern    Ambulation Surface  Level;Indoor    Ramp  5: Supervision    Ramp Details (indicate cue type and reason)  with rollator - cueing for brake usage and sequencing  Curb  5: Supervision;4: Min assist    Curb Details (indicate cue type and reason)  with rollator - cueing for brake usage and sequencing; x 3 reps          Balance Exercises - 03/24/18 1629      Balance Exercises: Standing   Standing Eyes Opened  Narrow base of support (BOS);Head turns;Foam/compliant surface;Solid surface    Standing Eyes Closed  Narrow base of support (BOS)    Other Standing Exercises  marching on foam; toe taps to 6" step on foam          PT Short Term Goals - 03/15/18 1124      PT SHORT TERM GOAL #1   Title  same as LTGs        PT Long Term Goals - 03/15/18 1124      PT LONG TERM GOAL #1   Title  patient to be independent with advanced HEP for balance and strength    Time  4    Status  New    Target Date  04/19/18      PT LONG TERM GOAL #2   Title   Patient to improve Berg to >/= 50/56 demonstrating reduced fall risk    Baseline  44/56    Time  4    Period  Weeks    Status  New    Target Date  04/19/18      PT LONG TERM GOAL #3   Title  patient to improve FGA to >/= 24/30 demonstrating improved dynamic balance    Baseline  19/30    Time  4    Status  New    Target Date  04/19/18      PT LONG TERM GOAL #4   Title  patient to improve gait speed to >/= 3.5 ft/ sec for improved community navigation and safety    Baseline  2.74 ft/sec    Time  4    Period  Weeks    Status  New    Target Date  04/19/18      PT LONG TERM GOAL #5   Title  patient to ambulate over various surfaces (even and uneven) as well as up/down inclines without LOB or instability with LRAD for >1000 feet    Time  4    Period  Weeks    Status  New    Target Date  04/19/18            Plan - 03/24/18 1623    Clinical Impression Statement  Continued gait training with rollator around objects as well as up/down ramp and curb. Patient requiring some cueing to not hit objects with rollator wheels. Required instruction on safety with rollator and curbs and may need re-visited. Initiated balance program with good toelrance - most difficulty with compliant surfaces. Will continue to progress.     Personal Factors and Comorbidities  Age;Transportation;Comorbidity 3+    Comorbidities  HTN, CVA, DM2, currently unable to drive, 73 y/o    Examination-Activity Limitations  Stairs;Locomotion Level    Examination-Participation Restrictions  Community Activity;Driving;Shop    Stability/Clinical Decision Making  Evolving/Moderate complexity    Rehab Potential  Good    PT Frequency  2x / week    PT Duration  4 weeks    PT Treatment/Interventions  ADLs/Self Care Home Management;Electrical Stimulation;DME Instruction;Gait training;Stair training;Functional mobility training;Patient/family education;Neuromuscular re-education;Balance training;Therapeutic exercise;Therapeutic  activities;Manual techniques;Visual/perceptual remediation/compensation;Vestibular    PT Next Visit Plan  continue to add  to HEP.  May need vestibular eval due to location of infarct.    Consulted and Agree with Plan of Care  Patient       Patient will benefit from skilled therapeutic intervention in order to improve the following deficits and impairments:  Abnormal gait, Decreased balance, Decreased strength, Decreased knowledge of use of DME, Difficulty walking  Visit Diagnosis: Unsteadiness on feet  Other abnormalities of gait and mobility  Muscle weakness (generalized)     Problem List Patient Active Problem List   Diagnosis Date Noted  . Homonymous hemianopia, left 02/08/2018  . HTN (hypertension)   . Trigger finger, right index finger 10/17/2017  . Low back pain 07/29/2017  . Carotid artery stenosis 11/02/2016  . Left arm weakness   . Diastolic dysfunction   . Hypertension with heart disease   . Tobacco abuse   . Acute blood loss anemia   . Cerebral infarction due to embolism of right middle cerebral artery ([place])   . Perirectal abscess 09/26/2015  . Vitamin D deficiency 07/23/2015  . Osteopenia 02/16/2013  . Ankle fracture 01/19/2013  . Anemia 09/27/2011  . Type II diabetes mellitus with nephropathy ([place]) 09/24/2011  . Hyperlipidemia 09/24/2011     Lanney Gins, PT, DPT [facility] Therapist 03/24/18 4:30 PM Pager: [phone] Office: [place] [address] [place], [place], [address] Phone: [phone]   Fax:  [phone]  Name: Sarah [name] MRN: [id number] Date of Birth: 1945/12/17

## 2018-03-24 NOTE — Patient Instructions (Addendum)
   Fine Motor Coordination Exercises  Perform the following exercises 2-3 for 5 min  times a day, as recommended by your occupational therapist.  Pick up 5 small objects (coins, marbles, paperclips, beads, etc.) one at a time and hold them in hand, then place them one by one onto the table. Pick up small objects and place them into a cup or container. Play card games. Practice shuffling and dealing cards. Flip cards over onto table one by one. typing- try zoom option to increase size to view

## 2018-03-28 NOTE — Progress Notes (Signed)
[facility] Summary Report / Loop Recorder 

## 2018-03-29 ENCOUNTER — Ambulatory Visit: Payer: HMO | Admitting: Occupational Therapy

## 2018-03-29 ENCOUNTER — Ambulatory Visit: Payer: HMO | Admitting: Physical Therapy

## 2018-03-31 ENCOUNTER — Ambulatory Visit: Payer: HMO | Admitting: Occupational Therapy

## 2018-03-31 ENCOUNTER — Ambulatory Visit: Payer: HMO | Admitting: Physical Therapy

## 2018-04-03 ENCOUNTER — Telehealth: Payer: Self-pay | Admitting: Occupational Therapy

## 2018-04-03 ENCOUNTER — Ambulatory Visit: Payer: HMO | Admitting: Occupational Therapy

## 2018-04-03 NOTE — Telephone Encounter (Signed)
Patient was contacted today regarding the temporary closing of [facility] due to Covid-19.  Therapist ensured pt had no current questions.  Both PT and OT will be reviewing current home programs and will update if appropriate and mail to patient. Pt verbalized understanding and stated she was very appreciative.   Do not feel pt could clinically benefit an e-visit, virtual check in, or telehealth visit.    [facility] will follow up with patients when we are able to resume care.  Forde Radon, OTR/L [facility] [address] [place], [place]  [address] Phone:  [phone] Fax:  [phone] \

## 2018-04-04 ENCOUNTER — Other Ambulatory Visit: Payer: Self-pay | Admitting: Family Medicine

## 2018-04-04 DIAGNOSIS — E1121 Type 2 diabetes mellitus with diabetic nephropathy: Secondary | ICD-10-CM

## 2018-04-05 ENCOUNTER — Ambulatory Visit: Payer: HMO | Admitting: Occupational Therapy

## 2018-04-05 ENCOUNTER — Ambulatory Visit: Payer: HMO | Admitting: Physical Therapy

## 2018-04-07 ENCOUNTER — Other Ambulatory Visit: Payer: Self-pay | Admitting: Family Medicine

## 2018-04-07 ENCOUNTER — Ambulatory Visit: Payer: HMO | Admitting: Physical Therapy

## 2018-04-07 ENCOUNTER — Ambulatory Visit: Payer: Medicare Other | Admitting: Physical Therapy

## 2018-04-10 ENCOUNTER — Ambulatory Visit: Payer: HMO | Admitting: Physical Therapy

## 2018-04-10 ENCOUNTER — Ambulatory Visit: Payer: HMO | Admitting: Occupational Therapy

## 2018-04-10 ENCOUNTER — Encounter: Payer: Self-pay | Admitting: Occupational Therapy

## 2018-04-10 ENCOUNTER — Encounter: Payer: Self-pay | Admitting: Physical Therapy

## 2018-04-10 ENCOUNTER — Other Ambulatory Visit: Payer: Self-pay

## 2018-04-10 DIAGNOSIS — R2681 Unsteadiness on feet: Secondary | ICD-10-CM | POA: Diagnosis not present

## 2018-04-10 DIAGNOSIS — R278 Other lack of coordination: Secondary | ICD-10-CM

## 2018-04-10 DIAGNOSIS — M6281 Muscle weakness (generalized): Secondary | ICD-10-CM

## 2018-04-10 DIAGNOSIS — R2689 Other abnormalities of gait and mobility: Secondary | ICD-10-CM

## 2018-04-10 DIAGNOSIS — H53462 Homonymous bilateral field defects, left side: Secondary | ICD-10-CM

## 2018-04-10 DIAGNOSIS — R41842 Visuospatial deficit: Secondary | ICD-10-CM

## 2018-04-10 NOTE — Therapy (Signed)
[facility] [address] [place], [place], [address] Phone: [phone]   Fax:  [phone]  Physical Therapy Treatment  Patient Details  Name: Sarah [name] MRN: [id number] Date of Birth: 1945-01-31 Referring Provider (PT): Betty [name], MD   Encounter Date: 04/10/2018  PT End of Session - 04/10/18 2042    Visit Number  4    Number of Visits  9    Date for PT Re-Evaluation  04/19/18    Authorization Type  UHC Medicare    PT Start Time  1100    PT Stop Time  1145    PT Time Calculation (min)  45 min    Equipment Utilized During Treatment  Gait belt    Activity Tolerance  Patient tolerated treatment well    Behavior During Therapy  [facility] for tasks assessed/performed       Past Medical History:  Diagnosis Date  . Boils   . Glaucoma   . Heart murmur   . HTN (hypertension)   . Hx of adenomatous colonic polyps   . Hyperlipidemia   . Osteoporosis   . Pneumonia   . Stroke ([place])   . Type II or unspecified type diabetes mellitus without mention of complication, uncontrolled     Past Surgical History:  Procedure Laterality Date  . ABDOMINAL HYSTERECTOMY    . CARPAL TUNNEL RELEASE     right  . COLONOSCOPY  12-10-10   per Dr. Deatra Ina, clear, repeat in 7 yrs   . INCISION AND DRAINAGE PERIRECTAL ABSCESS N/A 09/26/2015   Procedure: IRRIGATION AND DEBRIDEMENT PERIRECTAL ABSCESS;  Surgeon: Mickeal Skinner, MD;  Location: [facility];  Service: General;  Laterality: N/A;  . KNEE ARTHROSCOPY     right  . LOOP RECORDER INSERTION N/A 10/19/2016   Procedure: LOOP RECORDER INSERTION;  Surgeon: Thompson Grayer, MD;  Location: [facility];  Service: Cardiovascular;  Laterality: N/A;  . ORIF ANKLE FRACTURE Right 03/24/2012   Procedure: OPEN REDUCTION INTERNAL FIXATION (ORIF) ANKLE FRACTURE;  Surgeon: Newt Minion, MD;  Location: [facility];  Service: Orthopedics;  Laterality: Right;  Open Reduction Internal Fixation Right  Bimalleolar ankle fracture  . POLYPECTOMY    . TEE WITHOUT CARDIOVERSION N/A 10/18/2016   Procedure: TRANSESOPHAGEAL ECHOCARDIOGRAM (TEE);  Surgeon: Fay Records, MD;  Location: [facility] ENDOS[facility];  Service: Cardiovascular;  Laterality: N/A;  . TONSILLECTOMY      There were no vitals filed for this visit.  Subjective Assessment - 04/10/18 2035    Subjective  Pt states she has had some dizziness within past couple of weeks - says "it's my eyes" (flourescent lights make it worse); pt reports no dizziness at start of PT session    Pertinent History  DM2, CVA, HTN, osteoporosis, glaucoma, L visual field deficits    Patient Stated Goals  "I need to get strong"    Currently in Pain?  No/denies                       [facility] Adult PT Treatment/Exercise - 04/10/18 1127      Transfers   Transfers  Sit to Stand    Sit to Stand  5: Supervision    Number of Reps  10 reps    Comments  feet on blue Airex for compliant surface training      Ambulation/Gait   Ambulation/Gait  Yes    Ambulation/Gait Assistance  4: Min guard    Ambulation/Gait Assistance Details  intermittent head turns for environmental scanning    Ambulation Distance (Feet)  400 Feet    Assistive device  None    Gait Pattern  Step-through pattern    Ambulation Surface  Unlevel;Outdoor;Paved;Grass    Gait Comments  Pt amb. 100' around track tossing/catching medium sized ball:  amb. 10' backwards 2 reps tossing ball with CGA       High Level Balance   High Level Balance Activities  Backward walking;Head turns;Marching forwards;Marching backwards          Balance Exercises - 04/10/18 2040      Balance Exercises: Standing   Standing Eyes Opened  Wide (BOA);Narrow base of support (BOS);Head turns;Foam/compliant surface;5 reps    Standing Eyes Closed  Narrow base of support (BOS);Head turns;Foam/compliant surface;5 reps    SLS with Vectors  Foam/compliant surface;5 reps   forward, back and side kicks 5 reps  alternating with head tu   Other Standing Exercises  marching forwards and backwards on blue mat on floor - with horizontal head turns with CGA to min assist       Cone taps (4 used) - pt standing on floor;  Progressed to tipping cone over, then standing cone upright with CGA for  Recovery of LOB  Marching on AIrex pad - 10 reps; added horizontal head turns with min assist - 5 reps Alternate tap down to floor from standing on Airex pad - CGA for balance recovery   PT Short Term Goals - 03/15/18 1124      PT SHORT TERM GOAL #1   Title  same as LTGs        PT Long Term Goals - 03/15/18 1124      PT LONG TERM GOAL #1   Title  patient to be independent with advanced HEP for balance and strength    Time  4    Status  New    Target Date  04/19/18      PT LONG TERM GOAL #2   Title  Patient to improve Berg to >/= 50/56 demonstrating reduced fall risk    Baseline  44/56    Time  4    Period  Weeks    Status  New    Target Date  04/19/18      PT LONG TERM GOAL #3   Title  patient to improve FGA to >/= 24/30 demonstrating improved dynamic balance    Baseline  19/30    Time  4    Status  New    Target Date  04/19/18      PT LONG TERM GOAL #4   Title  patient to improve gait speed to >/= 3.5 ft/ sec for improved community navigation and safety    Baseline  2.74 ft/sec    Time  4    Period  Weeks    Status  New    Target Date  04/19/18      PT LONG TERM GOAL #5   Title  patient to ambulate over various surfaces (even and uneven) as well as up/down inclines without LOB or instability with LRAD for >1000 feet    Time  4    Period  Weeks    Status  New    Target Date  04/19/18            Plan - 04/10/18 2043    Clinical Impression Statement  Pt performed all gait training and balance activities without use of rollator in today's session;  pt did not c/o dizziness with balance activities with head turns but reported dizziness "about a 5/10" when asked after session  completed  if she had had any dizziness during session.  Pt had no major LOB with any balance or gait training activity - demonstrating improvement in balance.                                                                   Rehab Potential  Good    PT Frequency  2x / week    PT Duration  4 weeks    PT Treatment/Interventions  ADLs/Self Care Home Management;Electrical Stimulation;DME Instruction;Gait training;Stair training;Functional mobility training;Patient/family education;Neuromuscular re-education;Balance training;Therapeutic exercise;Therapeutic activities;Manual techniques;Visual/perceptual remediation/compensation;Vestibular    PT Next Visit Plan  cont gait and balance - without rollator    Consulted and Agree with Plan of Care  Patient       Patient will benefit from skilled therapeutic intervention in order to improve the following deficits and impairments:  Abnormal gait, Decreased balance, Decreased strength, Decreased knowledge of use of DME, Difficulty walking  Visit Diagnosis: Unsteadiness on feet  Other abnormalities of gait and mobility     Problem List Patient Active Problem List   Diagnosis Date Noted  . Homonymous hemianopia, left 02/08/2018  . HTN (hypertension)   . Trigger finger, right index finger 10/17/2017  . Low back pain 07/29/2017  . Carotid artery stenosis 11/02/2016  . Left arm weakness   . Diastolic dysfunction   . Hypertension with heart disease   . Tobacco abuse   . Acute blood loss anemia   . Cerebral infarction due to embolism of right middle cerebral artery ([place])   . Perirectal abscess 09/26/2015  . Vitamin D deficiency 07/23/2015  . Osteopenia 02/16/2013  . Ankle fracture 01/19/2013  . Anemia 09/27/2011  . Type II diabetes mellitus with nephropathy ([place]) 09/24/2011  . Hyperlipidemia 09/24/2011    [place], PT 04/10/2018, 8:48 PM  [facility] [address] [place], [place], [address] Phone: [phone]   Fax:  [phone]  Name: BETTYLOU FREW MRN: [id number] Date of Birth: 1945-04-10

## 2018-04-10 NOTE — Patient Instructions (Signed)
This is an upgrade to the vision program you have been doing at home.   1. Continue with the eye exercises where you cover one eye and track the object then switch and cover the other eye and track.  GO SLOW and keep you eye on the target!  Make sure to do up/down, side to side, and both diagonals.  Do 5 repetitions in each direction.  Do 3-4 times per day as long as you don't get a headache.  2. Your double vision is improving!  We will add this exercise:  With both eyes at the same time, track the object up/down, side to side and diagonals in the area where you only see one object.  DO NOT MOVE INTO AREAS that created double vision.  MOVE SLOWLY and keep your eyes on the target. This is starting to train the eyes to move together.  3. Last one:  Bring the object to one side until you just start to see the beginning of a second image. STOP the target and see if you can make it into one image. Do this to the right, to the left, up and down.  Do it 3 times in each direction.

## 2018-04-10 NOTE — Therapy (Signed)
[facility] [address] [place], [place], [address] Phone: [phone]   Fax:  [phone]  Occupational Therapy Treatment  Patient Details  Name: Sarah [name] MRN: [id number] Date of Birth: 04-Sep-1945 Referring Provider (OT): Betty [name]   Encounter Date: 04/10/2018  OT End of Session - 04/10/18 1246    Visit Number  4    Number of Visits  9    Date for OT Re-Evaluation  04/14/18    Authorization Type  [employer] Medicare    Authorization Time Period  90 days    Authorization - Visit Number  4    Authorization - Number of Visits  10    OT Start Time  6767    OT Stop Time  1229    OT Time Calculation (min)  44 min    Activity Tolerance  Patient tolerated treatment well       Past Medical History:  Diagnosis Date  . Boils   . Glaucoma   . Heart murmur   . HTN (hypertension)   . Hx of adenomatous colonic polyps   . Hyperlipidemia   . Osteoporosis   . Pneumonia   . Stroke ([place])   . Type II or unspecified type diabetes mellitus without mention of complication, uncontrolled     Past Surgical History:  Procedure Laterality Date  . ABDOMINAL HYSTERECTOMY    . CARPAL TUNNEL RELEASE     right  . COLONOSCOPY  12-10-10   per Dr. Deatra Ina, clear, repeat in 7 yrs   . INCISION AND DRAINAGE PERIRECTAL ABSCESS N/A 09/26/2015   Procedure: IRRIGATION AND DEBRIDEMENT PERIRECTAL ABSCESS;  Surgeon: Mickeal Skinner, MD;  Location: [facility];  Service: General;  Laterality: N/A;  . KNEE ARTHROSCOPY     right  . LOOP RECORDER INSERTION N/A 10/19/2016   Procedure: LOOP RECORDER INSERTION;  Surgeon: Thompson Grayer, MD;  Location: [facility];  Service: Cardiovascular;  Laterality: N/A;  . ORIF ANKLE FRACTURE Right 03/24/2012   Procedure: OPEN REDUCTION INTERNAL FIXATION (ORIF) ANKLE FRACTURE;  Surgeon: Newt Minion, MD;  Location: [facility];  Service: Orthopedics;  Laterality: Right;  Open Reduction Internal Fixation Right  Bimalleolar ankle fracture  . POLYPECTOMY    . TEE WITHOUT CARDIOVERSION N/A 10/18/2016   Procedure: TRANSESOPHAGEAL ECHOCARDIOGRAM (TEE);  Surgeon: Fay Records, MD;  Location[facility]OSCOPY;  Service: Cardiovascular;  Laterality: N/A;  . TONSILLECTOMY      There were no vitals filed for this visit.  Subjective Assessment - 04/10/18 1154    Subjective   I think my vision and balance are a little better    Pertinent History  Pt s/p R parietal-occipital, PICA watershed and R frontal CVA's, mutli infarct underlying ischemic disease. history of stroke in 2018- nearly full recovery;     Patient Stated Goals  get my vision and walking better    Currently in Pain?  No/denies                   OT Treatments/Exercises (OP) - 04/10/18 0001      ADLs   ADL Comments  Addressed reading with pt now that diplopia has improved and headaches are reduced.  Pt with improved ability with larger font and with reading guides both for paper reading and computer reading. Pt given all information to order guides and verbalized understanding (handouts given).        Neurological Re-education Exercises   Other Exercises 1  Reassessed diplopia -  pt with improvement in all fields and now has improved radius for single vision in all directions.  Able to upgrade HEP for vision - after instruction and practice, pt able to return demonstate all exercises.  See pt instruction for details.     Other Exercises 2  Also addressed functional ambulation with head turns as well as assessed gaze stabilization. Pt continues to have some gaze stablization issues with head nods as well as disequilibirum with R head turns however improving.  WIll add gaze stabilization exercises to HEP once diploplia further improves.              OT Education - 04/10/18 1244    Education Details  Upgraded vision HEP, info on reading guides and font size to add in reading.     Person(s) Educated  Patient    Methods   Explanation;Demonstration;Handout    Comprehension  Verbalized understanding;Returned demonstration       OT Short Term Goals - 03/15/18 1051      OT SHORT TERM GOAL #1   Title  XXXXX      OT SHORT TERM GOAL #2   Title  XXXXX      OT SHORT TERM GOAL #3   Title  XXXXXX      OT SHORT TERM GOAL #4   Title  XXXXXX      OT SHORT TERM GOAL #5   Title  [id number]        OT Long Term Goals - 04/10/18 1245      OT LONG TERM GOAL #1   Title  Pt will be mod I with upgraded HEP for R/L Coordination as well as vision due 04/14/18    Time  4    Period  Weeks    Status  Revised      OT LONG TERM GOAL #2   Title  Pt will demonstrate improved grip strength to at least 35 lbs R/L to assist with opening bottles or packages.      Baseline  R:  16LB, L: 25LB    Time  4    Period  Weeks    Status  On-going      OT LONG TERM GOAL #3   Title  Pt will demonstrate improved coordination as evidenced by decreasing time on 9 hole peg test to under 30 seconds in both hands to aide with typing, maipulating change/paper money, etc.      Baseline  R:  29.88, L:  37.87    Time  4    Period  Weeks    Status  On-going      OT LONG TERM GOAL #4   Title  Patient will navigate in moderately busy environment (e.g. grocery store) without hitting obstacles per patient's report    Time  4    Period  Weeks    Status  On-going      OT LONG TERM GOAL #5   Title  Patient will demonstate compensatory strategies that will allow her to comfortably read for pleasure for 10-15 min at a time without eye strain, headache.      Time  4    Period  Weeks    Status  On-going            Plan - 04/10/18 1245    Clinical Impression Statement  Pt progressing toward goals and demonstrates decreased diplopia and improving balance.     OT Occupational Profile and History  Detailed Assessment- Review of Records  and additional review of physical, cognitive, psychosocial history related to current functional performance     Occupational performance deficits (Please refer to evaluation for details):  ADL's;IADL's;Leisure    Body Structure / Function / Physical Skills  ADL;Coordination;Endurance;GMC;UE functional use;Balance;Vestibular;Vision;Pain;IADL;Decreased knowledge of use of DME;Dexterity;FMC;Strength    Rehab Potential  Good    Comorbidities Affecting Occupational Performance:  Presence of comorbidities impacting occupational performance    Comorbidities impacting occupational performance description:  prior stroke with residual left sided hemiparesis, diabetes, hypertension    Modification or Assistance to Complete Evaluation   Min-Moderate modification of tasks or assist with assess necessary to complete eval    OT Frequency  2x / week    OT Duration  4 weeks    OT Treatment/Interventions  Self-care/ADL training;Therapeutic exercise;Visual/perceptual remediation/compensation;Patient/family education;Neuromuscular education;Balance training;Therapeutic activities;Functional Mobility Training;DME and/or AE instruction    Plan  HEP Putty for BUE (She has green putty at home)  Check coord exercises, work on [employer] to help use of computer, tabletop and environmental visual scanning    Recommended Other Services  Neurooptometry if condition persists    Consulted and Agree with Plan of Care  Patient       Patient will benefit from skilled therapeutic intervention in order to improve the following deficits and impairments:  Body Structure / Function / Physical Skills  Visit Diagnosis: Unsteadiness on feet  Muscle weakness (generalized)  Visuospatial deficit  Other lack of coordination  Homonymous bilateral field defects, left side    Problem List Patient Active Problem List   Diagnosis Date Noted  . Homonymous hemianopia, left 02/08/2018  . HTN (hypertension)   . Trigger finger, right index finger 10/17/2017  . Low back pain 07/29/2017  . Carotid artery stenosis 11/02/2016  . Left arm  weakness   . Diastolic dysfunction   . Hypertension with heart disease   . Tobacco abuse   . Acute blood loss anemia   . Cerebral infarction due to embolism of right middle cerebral artery ([place])   . Perirectal abscess 09/26/2015  . Vitamin D deficiency 07/23/2015  . Osteopenia 02/16/2013  . Ankle fracture 01/19/2013  . Anemia 09/27/2011  . Type II diabetes mellitus with nephropathy ([place]) 09/24/2011  . Hyperlipidemia 09/24/2011    Quay Burow, OTR/L 04/10/2018, 12:48 PM  [facility] [address] [place], [place], [address] Phone: [phone]   Fax:  [phone]  Name: Sarah [name] MRN: [id number] Date of Birth: [birth year]

## 2018-04-12 ENCOUNTER — Ambulatory Visit: Payer: Medicare Other | Admitting: Physical Therapy

## 2018-04-12 ENCOUNTER — Encounter: Payer: Medicare Other | Admitting: Occupational Therapy

## 2018-04-12 ENCOUNTER — Other Ambulatory Visit: Payer: Self-pay | Admitting: Family Medicine

## 2018-04-14 ENCOUNTER — Encounter: Payer: Medicare Other | Admitting: Occupational Therapy

## 2018-04-14 ENCOUNTER — Ambulatory Visit: Payer: Medicare Other | Admitting: Physical Therapy

## 2018-04-17 ENCOUNTER — Ambulatory Visit: Payer: HMO | Admitting: Physical Therapy

## 2018-04-17 ENCOUNTER — Ambulatory Visit: Payer: HMO | Attending: Family Medicine | Admitting: Occupational Therapy

## 2018-04-17 ENCOUNTER — Encounter: Payer: Self-pay | Admitting: Physical Therapy

## 2018-04-17 ENCOUNTER — Encounter: Payer: Self-pay | Admitting: Occupational Therapy

## 2018-04-17 DIAGNOSIS — R2681 Unsteadiness on feet: Secondary | ICD-10-CM

## 2018-04-17 DIAGNOSIS — R2689 Other abnormalities of gait and mobility: Secondary | ICD-10-CM | POA: Diagnosis not present

## 2018-04-17 DIAGNOSIS — R278 Other lack of coordination: Secondary | ICD-10-CM | POA: Insufficient documentation

## 2018-04-17 DIAGNOSIS — M6281 Muscle weakness (generalized): Secondary | ICD-10-CM | POA: Diagnosis not present

## 2018-04-17 DIAGNOSIS — R41842 Visuospatial deficit: Secondary | ICD-10-CM | POA: Insufficient documentation

## 2018-04-17 NOTE — Therapy (Signed)
[facility] [address] [place], [place], [address] Phone: [phone]   Fax:  [phone]  Occupational Therapy Treatment  Patient Details  Name: Sarah [name] MRN: [id number] Date of Birth: 1945-09-23 Referring Provider (OT): Betty [name]   Encounter Date: 04/17/2018  OT End of Session - 04/17/18 1204    Visit Number  5    Number of Visits  13    Date for OT Re-Evaluation  06/05/18    Authorization Type  [employer] Medicare    Authorization Time Period  90 days    Authorization - Visit Number  5    Authorization - Number of Visits  10    OT Start Time  1016    OT Stop Time  1100    OT Time Calculation (min)  44 min    Activity Tolerance  Patient tolerated treatment well       Past Medical History:  Diagnosis Date  . Boils   . Glaucoma   . Heart murmur   . HTN (hypertension)   . Hx of adenomatous colonic polyps   . Hyperlipidemia   . Osteoporosis   . Pneumonia   . Stroke ([place])   . Type II or unspecified type diabetes mellitus without mention of complication, uncontrolled     Past Surgical History:  Procedure Laterality Date  . ABDOMINAL HYSTERECTOMY    . CARPAL TUNNEL RELEASE     right  . COLONOSCOPY  12-10-10   per Dr. Deatra Ina, clear, repeat in 7 yrs   . INCISION AND DRAINAGE PERIRECTAL ABSCESS N/A 09/26/2015   Procedure: IRRIGATION AND DEBRIDEMENT PERIRECTAL ABSCESS;  Surgeon: Mickeal Skinner, MD;  Location: [facility];  Service: General;  Laterality: N/A;  . KNEE ARTHROSCOPY     right  . LOOP RECORDER INSERTION N/A 10/19/2016   Procedure: LOOP RECORDER INSERTION;  Surgeon: Thompson Grayer, MD;  Location: [facility];  Service: Cardiovascular;  Laterality: N/A;  . ORIF ANKLE FRACTURE Right 03/24/2012   Procedure: OPEN REDUCTION INTERNAL FIXATION (ORIF) ANKLE FRACTURE;  Surgeon: Newt Minion, MD;  Location: [facility];  Service: Orthopedics;  Laterality: Right;  Open Reduction Internal Fixation Right  Bimalleolar ankle fracture  . POLYPECTOMY    . TEE WITHOUT CARDIOVERSION N/A 10/18/2016   Procedure: TRANSESOPHAGEAL ECHOCARDIOGRAM (TEE);  Surgeon: Fay Records, MD;  [facility];  Service: Cardiovascular;  Laterality: N/A;  . TONSILLECTOMY      There were no vitals filed for this visit.  Subjective Assessment - 04/17/18 1020    Subjective   I think my eyes are less ligth sensitive and my vision seems better. I ordered those reading guides     Pertinent History  Pt s/p R parietal-occipital, PICA watershed and R frontal CVA's, mutli infarct underlying ischemic disease. history of stroke in 2018- nearly full recovery;     Patient Stated Goals  get my vision and walking better    Currently in Pain?  Yes    Pain Score  5     Pain Location  Hand   index finger   Pain Orientation  Right    Pain Descriptors / Indicators  Stabbing;Sharp    Pain Type  Acute pain    Pain Onset  In the past 7 days    Pain Frequency  Intermittent    Aggravating Factors   when I move it - it's my arthrtis. I have had this before    Pain Relieving Factors  I  have a cream and if it gets really bad I usually get an cortisone shot                   OT Treatments/Exercises (OP) - 04/17/18 1152      ADLs   ADL Comments  Checked goals - see goals for updates. Pt with acute flair up of arthritis in R hand today so was not able to evaluate R grip or coordination - will assess once pain has improved.        Neurological Re-education Exercises   Other Exercises 1  Pt issued HEP for bilateral hand coordination.  Pt has met grip strength goal for L hand - will assess and provide putty for R hand once arthritis pain has improved. Pt in agreement. Pt able to return demonstrate all activities after instruction and demonstration as well as practice.  Also further assessed diplopia - vision appears to be stable since last visit.  Pt does report less light sensitivity and has not had any headaches.   Addressed functional ambulation with head turns and head nods and pt without LOB today.               OT Education - 04/17/18 1156    Education Details  HEP for R hand coordination    Person(s) Educated  Patient    Methods  Explanation;Demonstration;Handout    Comprehension  Verbalized understanding;Returned demonstration       OT Short Term Goals - 03/15/18 1051      OT SHORT TERM GOAL #1   Title  XXXXX      OT SHORT TERM GOAL #2   Title  XXXXX      OT SHORT TERM GOAL #3   Title  XXXXXX      OT SHORT TERM GOAL #4   Title  XXXXXX      OT SHORT TERM GOAL #5   Title  [id number]        OT Long Term Goals - 04/17/18 1156      OT LONG TERM GOAL #1   Title  Pt will be mod I with upgraded HEP for R/L Coordination as well as vision due 04/14/18    Time  4    Period  Weeks    Status  Achieved      OT LONG TERM GOAL #2   Title  Pt will demonstrate improved grip strength to at least 35 lbs R/L to assist with opening bottles or packages.  Check goals 06/05/2018     Baseline  R:  16LB, L: 25LB    Time  4    Period  Weeks    Status  On-going   04/17/2018 L= 35, unable to assess R today due to arthritis flair up will assess once pain improves.      OT LONG TERM GOAL #3   Title  Pt will demonstrate improved coordination as evidenced by decreasing time on 9 hole peg test to under 30 seconds in both hands to aide with typing, maipulating change/paper money, etc.      Baseline  R:  29.88, L:  37.87    Time  4    Period  Weeks    Status  On-going   04/17/2018 L = 32.12, unable to assess R due to arthritis flair up will asssess once pain improves     OT LONG TERM GOAL #4   Title  Patient will navigate in moderately busy environment (e.g. grocery store) without  hitting obstacles per patient's report    Time  4    Period  Weeks    Status  Achieved      OT LONG TERM GOAL #5   Title  Patient will demonstate compensatory strategies that will allow her to comfortably read for pleasure  for 10-15 min at a time without eye strain, headache.      Time  4    Period  Weeks    Status  On-going            Plan - 04/17/18 1159    Clinical Impression Statement  Goals assessed today -see goal section for details.  Due to limited visits (due to Covid-19), pt has only had 5/8 visits since evalution.  WIll renew pt today for 8 weeks to allow for intermittent holds in therapy for pt to work on HEP at home toward goals.     Occupational performance deficits (Please refer to evaluation for details):  ADL's;IADL's;Leisure    Body Structure / Function / Physical Skills  ADL;Coordination;Endurance;GMC;UE functional use;Balance;Vestibular;Vision;Pain;IADL;Decreased knowledge of use of DME;Dexterity;FMC;Strength    Comorbidities Affecting Occupational Performance:  Presence of comorbidities impacting occupational performance    Comorbidities impacting occupational performance description:  prior stroke with residual left sided hemiparesis, diabetes, hypertension    OT Frequency  1x / week    OT Duration  8 weeks    OT Treatment/Interventions  Self-care/ADL training;Therapeutic exercise;Visual/perceptual remediation/compensation;Patient/family education;Neuromuscular education;Balance training;Therapeutic activities;Functional Mobility Training;DME and/or AE instruction    Plan  Pt to be placed on hold for 2 weeks to allow pt to work at home toward goals Check HEP for coordination. If R hand arthritis pain has improved check grip strength and coordination in R hand.  Continue to progress eye HEP if possible as well as to address dynamic standing balance via functional tasks that require head movement and uneven surfaces.  Assess environmental visual scanning    Consulted and Agree with Plan of Care  Patient       Patient will benefit from skilled therapeutic intervention in order to improve the following deficits and impairments:  Body Structure / Function / Physical Skills  Visit  Diagnosis: Unsteadiness on feet  Muscle weakness (generalized)  Visuospatial deficit  Other lack of coordination    Problem List Patient Active Problem List   Diagnosis Date Noted  . Homonymous hemianopia, left 02/08/2018  . HTN (hypertension)   . Trigger finger, right index finger 10/17/2017  . Low back pain 07/29/2017  . Carotid artery stenosis 11/02/2016  . Left arm weakness   . Diastolic dysfunction   . Hypertension with heart disease   . Tobacco abuse   . Acute blood loss anemia   . Cerebral infarction due to embolism of right middle cerebral artery ([place])   . Perirectal abscess 09/26/2015  . Vitamin D deficiency 07/23/2015  . Osteopenia 02/16/2013  . Ankle fracture 01/19/2013  . Anemia 09/27/2011  . Type II diabetes mellitus with nephropathy ([place]) 09/24/2011  . Hyperlipidemia 09/24/2011    Quay Burow, OTR/L 04/17/2018, 12:06 PM  [facility] [address] [place], [place], [address] Phone: [phone]   Fax:  [phone]  Name: Sarah [name] MRN: [id number] Date of Birth: April 09, 1945

## 2018-04-17 NOTE — Therapy (Addendum)
[facility] [address] [place], [place], [address] Phone: [phone]   Fax:  [phone]  Physical Therapy Treatment  Patient Details  Name: Sarah [name] MRN: [id number] Date of Birth: 03-04-1945 Referring Provider (PT): Betty [name], MD   Encounter Date: 04/17/2018  PT End of Session - 04/17/18 1102    Visit Number  5    Number of Visits  9    Date for PT Re-Evaluation  04/19/18    Authorization Type  UHC Medicare    PT Start Time  1058    PT Stop Time  1144    PT Time Calculation (min)  46 min    Equipment Utilized During Treatment  Gait belt    Activity Tolerance  Patient tolerated treatment well    Behavior During Therapy  [facility] for tasks assessed/performed       Past Medical History:  Diagnosis Date  . Boils   . Glaucoma   . Heart murmur   . HTN (hypertension)   . Hx of adenomatous colonic polyps   . Hyperlipidemia   . Osteoporosis   . Pneumonia   . Stroke ([facility])   . Type II or unspecified type diabetes mellitus without mention of complication, uncontrolled     Past Surgical History:  Procedure Laterality Date  . ABDOMINAL HYSTERECTOMY    . CARPAL TUNNEL RELEASE     right  . COLONOSCOPY  12-10-10   per Dr. Deatra Ina, clear, repeat in 7 yrs   . INCISION AND DRAINAGE PERIRECTAL ABSCESS N/A 09/26/2015   Procedure: IRRIGATION AND DEBRIDEMENT PERIRECTAL ABSCESS;  Surgeon: Mickeal Skinner, MD;  Location: [facility];  Service: General;  Laterality: N/A;  . KNEE ARTHROSCOPY     right  . LOOP RECORDER INSERTION N/A 10/19/2016   Procedure: LOOP RECORDER INSERTION;  Surgeon: Thompson Grayer, MD;  Location: [facility];  Service: Cardiovascular;  Laterality: N/A;  . ORIF ANKLE FRACTURE Right 03/24/2012   Procedure: OPEN REDUCTION INTERNAL FIXATION (ORIF) ANKLE FRACTURE;  Surgeon: Newt Minion, MD;  Location: [facility];  Service: Orthopedics;  Laterality: Right;  Open Reduction Internal Fixation Right  Bimalleolar ankle fracture  . POLYPECTOMY    . TEE WITHOUT CARDIOVERSION N/A 10/18/2016   Procedure: TRANSESOPHAGEAL ECHOCARDIOGRAM (TEE);  Surgeon: Fay Records, MD;  Location: [facility];  Service: Cardiovascular;  Laterality: N/A;  . TONSILLECTOMY      There were no vitals filed for this visit.  Subjective Assessment - 04/17/18 1059    Subjective  No falls. HEP is going well. No new complaints since last session.     Pertinent History  DM2, CVA, HTN, osteoporosis, glaucoma, L visual field deficits    Patient Stated Goals  "I need to get strong"    Currently in Pain?  Yes    Pain Score  5     Pain Location  Hand   just below index finger on palm side   Pain Orientation  Right    Pain Descriptors / Indicators  Aching;Sharp    Pain Type  Acute pain    Pain Onset  In the past 7 days    Pain Frequency  Intermittent    Aggravating Factors   hurts with movements- "it's my arthritis, i have had this before"    Pain Relieving Factors  has a medicated creame she uses at times          [facility] PT Assessment - 04/17/18 1106  Ambulation/Gait   Ambulation/Gait  Yes    Ambulation/Gait Assistance  4: Min guard;5: Supervision    Ambulation/Gait Assistance Details  supervision with rollator outdoors with cues for navigation needed; supervision to min guard assist for gait without device.     Ambulation Distance (Feet)  1000 Feet   x1 rollator;around gym w/testing no device   Assistive device  None    Gait Pattern  Step-through pattern;Decreased stride length    Ambulation Surface  Level;Indoor    Gait velocity  10.03 sec's= 3.27 ft/sec no AD, supervision      Standardized Balance Assessment   Standardized Balance Assessment  Berg Balance Test      Berg Balance Test   Sit to Stand  Able to stand without using hands and stabilize independently    Standing Unsupported  Able to stand safely 2 minutes    Sitting with Back Unsupported but Feet Supported on Floor or Stool  Able to sit  safely and securely 2 minutes    Stand to Sit  Sits safely with minimal use of hands    Transfers  Able to transfer safely, minor use of hands    Standing Unsupported with Eyes Closed  Able to stand 10 seconds with supervision    Standing Unsupported with Feet Together  Able to place feet together independently and stand for 1 minute with supervision   incr postural sway, mostly posterior   From Standing, Reach Forward with Outstretched Arm  Can reach confidently >25 cm (10")    From Standing Position, Pick up Object from Floor  Able to pick up shoe safely and easily    From Standing Position, Turn to Look Behind Over each Shoulder  Looks behind one side only/other side shows less weight shift   right > left side   Turn 360 Degrees  Able to turn 360 degrees safely in 4 seconds or less    Standing Unsupported, Alternately Place Feet on Step/Stool  Able to stand independently and safely and complete 8 steps in 20 seconds   10.79 sec's   Standing Unsupported, One Foot in Front  Able to plae foot ahead of the other independently and hold 30 seconds    Standing on One Leg  Able to lift leg independently and hold 5-10 seconds    Total Score  51    Berg comment:  51/56= moderate risk for falls      Functional Gait  Assessment   Gait assessed   Yes    Gait Level Surface  Walks 20 ft in less than 7 sec but greater than 5.5 sec, uses assistive device, slower speed, mild gait deviations, or deviates 6-10 in outside of the 12 in walkway width.   6.63 sec's   Change in Gait Speed  Able to smoothly change walking speed without loss of balance or gait deviation. Deviate no more than 6 in outside of the 12 in walkway width.    Gait with Horizontal Head Turns  Performs head turns smoothly with no change in gait. Deviates no more than 6 in outside 12 in walkway width    Gait with Vertical Head Turns  Performs head turns with no change in gait. Deviates no more than 6 in outside 12 in walkway width.    Gait  and Pivot Turn  Pivot turns safely within 3 sec and stops quickly with no loss of balance.    Step Over Obstacle  Is able to step over one shoe box (4.5 in  total height) without changing gait speed. No evidence of imbalance.    Gait with Narrow Base of Support  Ambulates less than 4 steps heel to toe or cannot perform without assistance.    Gait with Eyes Closed  Walks 20 ft, slow speed, abnormal gait pattern, evidence for imbalance, deviates 10-15 in outside 12 in walkway width. Requires more than 9 sec to ambulate 20 ft.   12.56 sec's   Ambulating Backwards  Walks 20 ft, slow speed, abnormal gait pattern, evidence for imbalance, deviates 10-15 in outside 12 in walkway width.   15.46 sec's   Steps  Alternating feet, must use rail.    Total Score  20    FGA comment:  medium fall risk              PT Short Term Goals - 03/15/18 1124      PT SHORT TERM GOAL #1   Title  same as LTGs        PT Long Term Goals - 04/17/18 1104      PT LONG TERM GOAL #1   Title  patient to be independent with advanced HEP for balance and strength    Baseline  04/17/2018: met with current HEP, may need to considate as she has a lot between PT and OT    Status  Achieved      PT LONG TERM GOAL #2   Title  Patient to improve Berg to >/= 50/56 demonstrating reduced fall risk    Baseline  04/17/18: 51/56 scored today    Time  --    Period  --    Status  Achieved      PT LONG TERM GOAL #3   Title  patient to improve FGA to >/= 24/30 demonstrating improved dynamic balance    Baseline  04/17/18: 20/30 scored today, improved just not to goal    Time  --    Status  Partially Met      PT LONG TERM GOAL #4   Title  patient to improve gait speed to >/= 3.5 ft/ sec for improved community navigation and safety    Baseline  04/17/18: 3.27 ft/sec no AD with supervision    Time  --    Period  --    Status  Achieved      PT LONG TERM GOAL #5   Title  patient to ambulate over various surfaces (even and uneven) as  well as up/down inclines without LOB or instability with LRAD for >1000 feet    Baseline  04/17/18: met with rollator today    Time  --    Period  --    Status  Achieved       New goals for recertification: PT Short Term Goals - 04/24/18 0926      PT SHORT TERM GOAL #1   Title  patient to be independent with advanced/consolidated HEP for balance and strength    Time  4    Period  Weeks    Status  New    Target Date  05/24/18      PT SHORT TERM GOAL #2   Title  Patient to improve Berg to >/= 53/56 demonstrating reduced fall risk    Baseline  51/56    Time  4    Period  Weeks    Status  New    Target Date  05/24/18      PT SHORT TERM GOAL #3   Title  patient to  improve FGA to >/= 22/30 demonstrating improved dynamic balance    Baseline  20/30    Time  4    Period  Weeks    Status  New    Target Date  05/24/18      PT SHORT TERM GOAL #4   Title  patient to improve gait speed to >/= 3.5 ft/ sec for improved community navigation and safety    Baseline  3.27 ft/sec     Time  4    Period  Weeks    Status  New    Target Date  05/24/18      PT SHORT TERM GOAL #5   Title  patient to ambulate 500' over various surfaces (even and uneven) as well as up/down inclines while scanning environment without LOB or instability without AD, with supervision    Time  4    Period  Weeks    Status  New    Target Date  05/24/18      PT Long Term Goals - 04/24/18 0929      PT LONG TERM GOAL #1   Title  patient to be independent with advanced HEP for balance and strength    Time  8    Period  Weeks    Status  Revised    Target Date  06/23/18      PT LONG TERM GOAL #2   Title  Patient to improve Berg to >/= 55/56 demonstrating reduced fall risk    Time  8    Period  Weeks    Status  Revised    Target Date  06/23/18      PT LONG TERM GOAL #3   Title  patient to improve FGA to >/= 24/30 demonstrating improved dynamic balance    Time  8    Period  Weeks    Status  Revised     Target Date  06/23/18      PT LONG TERM GOAL #4   Title  patient to improve gait speed to >/= 3.8 ft/ sec for improved community navigation and safety    Time  8    Period  Weeks    Status  Revised    Target Date  06/23/18      PT LONG TERM GOAL #5   Title  patient to ambulate over various surfaces (even and uneven) as well as up/down inclines without LOB or instability without AD for >1000 feet, supervision    Time  8    Period  Weeks    Status  Revised    Target Date  06/23/18           Plan - 04/17/18 1102    Clinical Impression Statement  Today's skilled session focused on progress toward LTGs for anticipated recertification. The pt either partially met or fully met goals.     Rehab Potential  Good    PT Frequency  2x / week    PT Duration  4 weeks    PT Treatment/Interventions  ADLs/Self Care Home Management;Electrical Stimulation;DME Instruction;Gait training;Stair training;Functional mobility training;Patient/family education;Neuromuscular re-education;Balance training;Therapeutic exercise;Therapeutic activities;Manual techniques;Visual/perceptual remediation/compensation;Vestibular    PT Next Visit Plan  consolidate her HEP!!! she has so much between PT and OT, make sure she is only doing what was mailed to her; continue to address gait and balance without AD toward updated goals.     Consulted and Agree with Plan of Care  Patient       Patient will  benefit from skilled therapeutic intervention in order to improve the following deficits and impairments:  Abnormal gait, Decreased balance, Decreased strength, Decreased knowledge of use of DME, Difficulty walking  Visit Diagnosis: Unsteadiness on feet  Muscle weakness (generalized)  Other abnormalities of gait and mobility     Problem List Patient Active Problem List   Diagnosis Date Noted  . Homonymous hemianopia, left 02/08/2018  . HTN (hypertension)   . Trigger finger, right index finger 10/17/2017  . Low  back pain 07/29/2017  . Carotid artery stenosis 11/02/2016  . Left arm weakness   . Diastolic dysfunction   . Hypertension with heart disease   . Tobacco abuse   . Acute blood loss anemia   . Cerebral infarction due to embolism of right middle cerebral artery ([place])   . Perirectal abscess 09/26/2015  . Vitamin D deficiency 07/23/2015  . Osteopenia 02/16/2013  . Ankle fracture 01/19/2013  . Anemia 09/27/2011  . Type II diabetes mellitus with nephropathy ([place]) 09/24/2011  . Hyperlipidemia 09/24/2011    Willow Ora, PTA, [facility] [address], [place], [place] [address] [phone] 04/17/18, 1:16 PM   Name: Sarah [name] MRN: [id number] Date of Birth: November 20, 1945

## 2018-04-17 NOTE — Patient Instructions (Signed)
  Coordination Activities  Perform the following activities for 15-20 minutes 1-2 times per day with both hand(s).   Rotate ball in fingertips (clockwise and counter-clockwise).  Toss ball between hands - ,make sure you keep your hands far enough apart that you are truly throwing the ball back and forth between your hands.  Toss ball in air and catch with the same hand.  Flip cards 1 at a time as fast as you can.  Practice shuffling cards with both hands  Hold deck in your hand and push ONE card a time off with your thumb  Pick up coins and place in container or coin bank.  Pick up coins and stack.  Pick up coins one at a time until you get 5-10 in your hand, then move coins from palm to fingertips to stack one at a time.  Practice writing and/or typing.  Screw together nuts and bolts, then unfasten. You will work on both hands turning so decide which hand is holding still and which hand is turning then switch. Put the nut all the way on and then all the way off. Use smaller nuts and bolts.

## 2018-04-19 ENCOUNTER — Ambulatory Visit: Payer: HMO | Admitting: Occupational Therapy

## 2018-04-19 ENCOUNTER — Ambulatory Visit: Payer: HMO | Admitting: Physical Therapy

## 2018-04-20 ENCOUNTER — Telehealth: Payer: Self-pay | Admitting: *Deleted

## 2018-04-20 NOTE — Telephone Encounter (Signed)
Patient informed that appointment will be virtual visit through [email or web address]. Patient verbalized understanding.

## 2018-04-20 NOTE — Telephone Encounter (Signed)
Copied from [facility] [id number]. Topic: Appointment Scheduling - Scheduling Inquiry for Clinic >> Apr 19, 2018  2:51 PM Margot Ables wrote: Reason for CRM: Pt called to see if 4/13 appt would be in office and if she is to have lab work prior. Please advise.

## 2018-04-24 ENCOUNTER — Encounter: Payer: Self-pay | Admitting: Family Medicine

## 2018-04-24 ENCOUNTER — Other Ambulatory Visit: Payer: Self-pay

## 2018-04-24 ENCOUNTER — Telehealth: Payer: Self-pay | Admitting: Family Medicine

## 2018-04-24 ENCOUNTER — Ambulatory Visit: Payer: HMO | Admitting: Occupational Therapy

## 2018-04-24 ENCOUNTER — Ambulatory Visit: Payer: HMO | Admitting: Physical Therapy

## 2018-04-24 ENCOUNTER — Ambulatory Visit (INDEPENDENT_AMBULATORY_CARE_PROVIDER_SITE_OTHER): Payer: HMO | Admitting: Family Medicine

## 2018-04-24 ENCOUNTER — Ambulatory Visit (INDEPENDENT_AMBULATORY_CARE_PROVIDER_SITE_OTHER): Payer: HMO | Admitting: *Deleted

## 2018-04-24 VITALS — BP 156/74 | HR 84 | Resp 12

## 2018-04-24 DIAGNOSIS — E1121 Type 2 diabetes mellitus with diabetic nephropathy: Secondary | ICD-10-CM

## 2018-04-24 DIAGNOSIS — R059 Cough, unspecified: Secondary | ICD-10-CM

## 2018-04-24 DIAGNOSIS — I119 Hypertensive heart disease without heart failure: Secondary | ICD-10-CM | POA: Diagnosis not present

## 2018-04-24 DIAGNOSIS — R05 Cough: Secondary | ICD-10-CM

## 2018-04-24 DIAGNOSIS — I639 Cerebral infarction, unspecified: Secondary | ICD-10-CM | POA: Diagnosis not present

## 2018-04-24 DIAGNOSIS — I63411 Cerebral infarction due to embolism of right middle cerebral artery: Secondary | ICD-10-CM | POA: Diagnosis not present

## 2018-04-24 LAB — CUP PACEART REMOTE DEVICE CHECK
Date Time Interrogation Session: 20200411123715
Implantable Pulse Generator Implant Date: 20181009

## 2018-04-24 MED ORDER — RANITIDINE HCL 300 MG PO TABS: 300.0000 mg | ORAL_TABLET | Freq: Every day | ORAL | 1 refills | Status: DC

## 2018-04-24 MED ORDER — OMEPRAZOLE 20 MG PO CPDR: 20.0000 mg | DELAYED_RELEASE_CAPSULE | Freq: Every day | ORAL | 3 refills | Status: DC

## 2018-04-24 MED ORDER — AMLODIPINE BESYLATE 5 MG PO TABS: 7.5000 mg | ORAL_TABLET | Freq: Every day | ORAL | 1 refills | Status: DC

## 2018-04-24 NOTE — Telephone Encounter (Signed)
Patient is requesting an alternative medication sent for PCP review

## 2018-04-24 NOTE — Telephone Encounter (Signed)
Please advise 

## 2018-04-24 NOTE — Assessment & Plan Note (Addendum)
Re-checked 144/72. Poorly controlled.  Amlodipine increased from 5 mg to 7.5 mg. No changes in Lisinopril 10 mg. Recommend monitoring BP daily. Continue low-salt diet. Follow-up in 2 months.

## 2018-04-24 NOTE — Assessment & Plan Note (Signed)
She did PT until a week ago. Adequate BP and glucose control. She is completing 3 months of Aspirin later this month, then she will continue with Plavix 75 mg daily.

## 2018-04-24 NOTE — Telephone Encounter (Signed)
Script was sent to pharmacy today for Ranitidine, but that medication has been taken off the market .  Can another similar medication be sent to the pharmacy?

## 2018-04-24 NOTE — Assessment & Plan Note (Signed)
Based on reported BS, progressing to be better controlled. We will check A1c next visit. No changes in current management.

## 2018-04-24 NOTE — Telephone Encounter (Signed)
Prescription for omeprazole 20 mg was sent to her pharmacy. Thanks, BJ

## 2018-04-24 NOTE — Progress Notes (Addendum)
Virtual Visit via Video Note   I connected with Ms Sarah [name] on 04/24/18 at  9:45 AM EDT by a video enabled telemedicine application and verified that I am speaking with the correct person using two identifiers.  Location patient: home Location provider:home office Persons participating in the virtual visit: patient, provider  I discussed the limitations of evaluation and management by telemedicine and the availability of in person appointments. The patient expressed understanding and agreed to proceed.   HPI: Last F/U visit on 02/14/18, hospital follow-up after a CVA. She has not had new problems since her last follow-up. DM 2: Currently she is on Lantus 50 units daily and Victoza 1.2 mg daily. She denies hypoglycemic events. FG in the low 100s, postprandial glucose < 200.  She has had some glucose in the mid 80s.  Denies abdominal pain, nausea,vomiting, polydipsia,polyuria, or polyphagia.  Lab Results  Component Value Date   HGBA1C 13.5 (H) 12/28/2017   Hypertension: Currently she is on lisinopril 10 mg and amlodipine 5 mg daily. She is not checking BP periodically. She denies unusual headache, visual changes, chest pain, dyspnea, palpitations, edema, or new focal deficit. Negative for decreased urine output or gross hematuria.  Lab Results  Component Value Date   CREATININE 0.53 02/14/2018   BUN 16 02/14/2018   NA 141 02/14/2018   K 4.2 02/14/2018   CL 107 02/14/2018   CO2 28 02/14/2018   CVA, he had a second brain on 02/08/2018. She has residual slurred speech from prior event and loss of peripheral vision after the last one.  She was doing PT until a week ago. She states that she feels "great." She is currently on Plavix 75 mg daily and has been 325 mg daily.  She is walking without assistance.   1 to 2 weeks of nonproductive cough, which seems to be worse at night when she is lying down. She denies associated fever, chills, unusual fatigue, body aches, dyspnea,  wheezing, abdominal pain, nausea, vomiting, or heartburn. She denies sick contacts or recent travel. She has not tried OTC medications. She would like patient current medication.   ROS: See pertinent positives and negatives per HPI.  Past Medical History:  Diagnosis Date  . Boils   . Glaucoma   . Heart murmur   . HTN (hypertension)   . Hx of adenomatous colonic polyps   . Hyperlipidemia   . Osteoporosis   . Pneumonia   . Stroke ([place])   . Type II or unspecified type diabetes mellitus without mention of complication, uncontrolled     Past Surgical History:  Procedure Laterality Date  . ABDOMINAL HYSTERECTOMY    . CARPAL TUNNEL RELEASE     right  . COLONOSCOPY  12-10-10   per Dr. Deatra Ina, clear, repeat in 7 yrs   . INCISION AND DRAINAGE PERIRECTAL ABSCESS N/A 09/26/2015   Procedure: IRRIGATION AND DEBRIDEMENT PERIRECTAL ABSCESS;  Surgeon: Mickeal Skinner, MD;  Location: [facility];  Service: General;  Laterality: N/A;  . KNEE ARTHROSCOPY     right  . LOOP RECORDER INSERTION N/A 10/19/2016   Procedure: LOOP RECORDER INSERTION;  Surgeon: Thompson Grayer, MD;  Location: [facility];  Service: Cardiovascular;  Laterality: N/A;  . ORIF ANKLE FRACTURE Right 03/24/2012   Procedure: OPEN REDUCTION INTERNAL FIXATION (ORIF) ANKLE FRACTURE;  Surgeon: Newt Minion, MD;  Location: [facility];  Service: Orthopedics;  Laterality: Right;  Open Reduction Internal Fixation Right Bimalleolar ankle fracture  . POLYPECTOMY    .  TEE WITHOUT CARDIOVERSION N/A 10/18/2016   Procedure: TRANSESOPHAGEAL ECHOCARDIOGRAM (TEE);  Surgeon: Fay Records, MD;  Location: [facility] END[facility];  Service: Cardiovascular;  Laterality: N/A;  . TONSILLECTOMY      Family History  Problem Relation Age of Onset  . Diabetes Mother   . Hypertension Mother   . Stroke Father   . Stroke Maternal Uncle   . Stroke Paternal Uncle   . Colon cancer Neg Hx   . Esophageal cancer Neg Hx   . Rectal cancer Neg Hx   . Stomach cancer Neg  Hx     Social History   Socioeconomic History  . Marital status: Single    Spouse name: Not on file  . Number of children: Not on file  . Years of education: Not on file  . Highest education level: Not on file  Occupational History  . Not on file  Social Needs  . Financial resource strain: Not on file  . Food insecurity:    Worry: Not on file    Inability: Not on file  . Transportation needs:    Medical: Not on file    Non-medical: Not on file  Tobacco Use  . Smoking status: Former Smoker    Types: Cigarettes    Last attempt to quit: 10/15/2016    Years since quitting: 1.5  . Smokeless tobacco: Never Used  . Tobacco comment: smokes occ.   Substance and Sexual Activity  . Alcohol use: Yes    Alcohol/week: 0.0 standard drinks    Comment: occ  . Drug use: No  . Sexual activity: Not on file  Lifestyle  . Physical activity:    Days per week: Not on file    Minutes per session: Not on file  . Stress: Not on file  Relationships  . Social connections:    Talks on phone: Not on file    Gets together: Not on file    Attends religious service: Not on file    Active member of club or organization: Not on file    Attends meetings of clubs or organizations: Not on file    Relationship status: Not on file  . Intimate partner violence:    Fear of current or ex partner: Not on file    Emotionally abused: Not on file    Physically abused: Not on file    Forced sexual activity: Not on file  Other Topics Concern  . Not on file  Social History Narrative  . Not on file      Current Outpatient Medications:  .  acetaminophen (TYLENOL) 500 MG tablet, Take 1,000 mg by mouth every 6 (six) hours as needed for mild pain or headache., Disp: , Rfl:  .  amLODipine (NORVASC) 5 MG tablet, Take 1.5 tablets (7.5 mg total) by mouth daily., Disp: 135 tablet, Rfl: 1 .  aspirin 325 MG tablet, Take 1 tablet (325 mg total) by mouth daily., Disp: 90 tablet, Rfl: 0 .  atorvastatin (LIPITOR) 80 MG  tablet, Take 1 tablet (80 mg total) by mouth daily at 6 PM., Disp: 30 tablet, Rfl: 0 .  Blood Glucose Calibration (OT ULTRA/FASTTK CNTRL SOLN) SOLN, , Disp: , Rfl:  .  calcium carbonate (TUMS EX) 750 MG chewable tablet, Chew 1 tablet by mouth as needed for heartburn., Disp: , Rfl:  .  clopidogrel (PLAVIX) 75 MG tablet, TAKE 1 TABLET BY MOUTH DAILY, Disp: 90 tablet, Rfl: 2 .  Insulin Pen Needle (BD PEN NEEDLE NANO U/F) 32G X  4 MM MISC, USE UP TO FOUR TIMES DAILY, Disp: 200 each, Rfl: 2 .  Lancet Devices (SIMPLE DIAGNOSTICS LANCING DEV) MISC, Check BS before and 2 hours after meals., Disp: 200 each, Rfl: 6 .  LANTUS SOLOSTAR 100 UNIT/ML Solostar Pen, INJECT 25 UNITS INTO THE SKIN TWICE DAILY, Disp: 15 mL, Rfl: 3 .  liraglutide (VICTOZA) 18 MG/3ML SOPN, 1.8 mcg daily., Disp: 3 pen, Rfl: 3 .  lisinopril (PRINIVIL,ZESTRIL) 10 MG tablet, Take 1 tablet (10 mg total) by mouth daily., Disp: 90 tablet, Rfl: 2 .  ONE TOUCH ULTRA TEST test strip, Check BS before and 2 hours after meals., Disp: 200 each, Rfl: 6 .  PHARMACIST CHOICE ALCOHOL 70 % PADS, , Disp: , Rfl:  .  ranitidine (ZANTAC) 300 MG tablet, Take 1 tablet (300 mg total) by mouth at bedtime., Disp: 30 tablet, Rfl: 1 .  ROCKLATAN 0.02-0.005 % SOLN, Place 1 drop into both eyes every evening., Disp: , Rfl:  .  TRAVATAN Z 0.004 % SOLN ophthalmic solution, Place 1 drop into both eyes at bedtime., Disp: , Rfl: 11 .  Vitamin D, Ergocalciferol, (DRISDOL) 1.25 MG (50000 UT) CAPS capsule, TAKE ONE CAPSULE BY MOUTH TWICE A WEEK ON MONDAY AND THURSDAY, Disp: 8 capsule, Rfl: 0  EXAM:  VITALS per patient if applicable:BP (!) 914/78   Pulse 84   Resp 12   GENERAL: alert, oriented, appears well and in no acute distress  HEENT: atraumatic, conjunttiva clear, no obvious abnormalities on inspection of face.  NECK: normal movements of the head and neck  LUNGS: on inspection no signs of respiratory distress, breathing rate appears normal, no obvious gross SOB,  gasping or wheezing  CV: no obvious cyanosis  MS: moves all visible extremities without noticeable abnormality  PSYCH/NEURO: pleasant and cooperative, no obvious depression or anxiety, minimal slurred speech, no focal deficit appreciated.Thought processing grossly intact  ASSESSMENT AND PLAN:  Discussed the following assessment and plan:  Cough We discussed possible etiologies, including medication, allergies,COPD, GERD among some.  I am recommending Omeprazole 20 mg at bedtime and GERD precautions for now. I will see him back in 2 months, if cough is persisting we may need to change lisinopril for a different antihypertensive medication.  Hypertension with heart disease Re-checked 144/72. Poorly controlled.  Amlodipine increased from 5 mg to 7.5 mg. No changes in Lisinopril 10 mg. Recommend monitoring BP daily. Continue low-salt diet. Follow-up in 2 months.  Type II diabetes mellitus with nephropathy (HCC) Based on reported BS, progressing to be better controlled. We will check A1c next visit. No changes in current management.  Cerebral infarction due to embolism of right middle cerebral artery ([place]) She did PT until a week ago. Adequate BP and glucose control. She is completing 3 months of Aspirin later this month, then she will continue with Plavix 75 mg daily.    I discussed the assessment and treatment plan with the patient. The patient was provided an opportunity to ask questions and all were answered. The patient agreed with the plan and demonstrated an understanding of the instructions.     Return in about 2 months (around 06/24/2018) for HTN,DM II,cough.    Sarah [name], MD

## 2018-04-24 NOTE — Telephone Encounter (Signed)
Copied from [place] [id number]. Topic: Quick Communication - Rx Refill/Question >> Apr 24, 2018  2:39 PM Alanda Slim E wrote: Medication: ranitidine (ZANTAC) 300 MG tablet - Pharmacy advised that they no longer carry or sell this medication. Pt needs an alternative called in asap and would like a call from the nurse to speak with her about something today/ please advise   Has the patient contacted their pharmacy?  Yes   Preferred Pharmacy (with phone number or street name):

## 2018-04-24 NOTE — Addendum Note (Signed)
Addended by: [name], [name] on: 04/24/2018 04:55 PM   Modules accepted: Orders

## 2018-04-24 NOTE — Telephone Encounter (Signed)
Message sent to the wrong office. Sending to [place].

## 2018-04-24 NOTE — Addendum Note (Signed)
Addended by: Rico Junker on: 04/24/2018 09:33 AM   Modules accepted: Orders

## 2018-04-26 ENCOUNTER — Ambulatory Visit: Payer: HMO | Admitting: Occupational Therapy

## 2018-04-26 ENCOUNTER — Ambulatory Visit: Payer: HMO | Admitting: Physical Therapy

## 2018-05-04 NOTE — Progress Notes (Signed)
[facility] Summary Report / Loop Recorder 

## 2018-05-14 ENCOUNTER — Other Ambulatory Visit: Payer: Self-pay | Admitting: Family Medicine

## 2018-05-25 ENCOUNTER — Ambulatory Visit (INDEPENDENT_AMBULATORY_CARE_PROVIDER_SITE_OTHER): Payer: HMO | Admitting: *Deleted

## 2018-05-25 ENCOUNTER — Other Ambulatory Visit: Payer: Self-pay

## 2018-05-25 DIAGNOSIS — I639 Cerebral infarction, unspecified: Secondary | ICD-10-CM | POA: Diagnosis not present

## 2018-05-25 LAB — CUP PACEART REMOTE DEVICE CHECK
Date Time Interrogation Session: 20200514161121
Implantable Pulse Generator Implant Date: 20181009

## 2018-05-31 NOTE — Progress Notes (Signed)
[facility] Summary Report / Loop Recorder 

## 2018-06-06 ENCOUNTER — Ambulatory Visit: Payer: HMO

## 2018-06-09 ENCOUNTER — Other Ambulatory Visit: Payer: Self-pay | Admitting: Family Medicine

## 2018-06-12 ENCOUNTER — Ambulatory Visit: Payer: HMO | Attending: Family Medicine | Admitting: Physical Therapy

## 2018-06-12 ENCOUNTER — Ambulatory Visit: Payer: HMO | Admitting: Occupational Therapy

## 2018-06-12 ENCOUNTER — Other Ambulatory Visit: Payer: Self-pay

## 2018-06-12 ENCOUNTER — Encounter: Payer: Self-pay | Admitting: Occupational Therapy

## 2018-06-12 DIAGNOSIS — R41842 Visuospatial deficit: Secondary | ICD-10-CM | POA: Insufficient documentation

## 2018-06-12 DIAGNOSIS — R2689 Other abnormalities of gait and mobility: Secondary | ICD-10-CM | POA: Diagnosis not present

## 2018-06-12 DIAGNOSIS — R2681 Unsteadiness on feet: Secondary | ICD-10-CM | POA: Diagnosis not present

## 2018-06-12 DIAGNOSIS — R278 Other lack of coordination: Secondary | ICD-10-CM | POA: Diagnosis not present

## 2018-06-12 DIAGNOSIS — M6281 Muscle weakness (generalized): Secondary | ICD-10-CM

## 2018-06-12 NOTE — Therapy (Signed)
[facility] [address] [place], [place], [address] Phone: [phone]   Fax:  [phone]  Occupational Therapy Treatment  Patient Details  Name: Sarah [name] MRN: [id number] Date of Birth: 1945-09-08 Referring Provider (OT): Betty [name]   Encounter Date: 06/12/2018  OT End of Session - 06/12/18 1743    Visit Number  6    Number of Visits  13    Date for OT Re-Evaluation  06/12/18   date adjusted due to restricted access to clinic due to Covid 19   Authorization Type  [employer] Medicare    Authorization Time Period  90 days    Authorization - Visit Number  6    Authorization - Number of Visits  10    OT Start Time  1703    OT Stop Time  1730    OT Time Calculation (min)  27 min    Activity Tolerance  Patient tolerated treatment well       Past Medical History:  Diagnosis Date  . Boils   . Glaucoma   . Heart murmur   . HTN (hypertension)   . Hx of adenomatous colonic polyps   . Hyperlipidemia   . Osteoporosis   . Pneumonia   . Stroke ([place])   . Type II or unspecified type diabetes mellitus without mention of complication, uncontrolled     Past Surgical History:  Procedure Laterality Date  . ABDOMINAL HYSTERECTOMY    . CARPAL TUNNEL RELEASE     right  . COLONOSCOPY  12-10-10   per Dr. Deatra Ina, clear, repeat in 7 yrs   . INCISION AND DRAINAGE PERIRECTAL ABSCESS N/A 09/26/2015   Procedure: IRRIGATION AND DEBRIDEMENT PERIRECTAL ABSCESS;  Surgeon: Mickeal Skinner, MD;  Location: [facility];  Service: General;  Laterality: N/A;  . KNEE ARTHROSCOPY     right  . LOOP RECORDER INSERTION N/A 10/19/2016   Procedure: LOOP RECORDER INSERTION;  Surgeon: Thompson Grayer, MD;  Location: [facility];  Service: Cardiovascular;  Laterality: N/A;  . ORIF ANKLE FRACTURE Right 03/24/2012   Procedure: OPEN REDUCTION INTERNAL FIXATION (ORIF) ANKLE FRACTURE;  Surgeon: Newt Minion, MD;  Location: [facility];  Service:  Orthopedics;  Laterality: Right;  Open Reduction Internal Fixation Right Bimalleolar ankle fracture  . POLYPECTOMY    . TEE WITHOUT CARDIOVERSION N/A 10/18/2016   Procedure: TRANSESOPHAGEAL ECHOCARDIOGRAM (TEE);  Surgeon: Fay Records, MD;  Location: [facility];  Service: Cardiovascular;  Laterality: N/A;  . TONSILLECTOMY      There were no vitals filed for this visit.  Subjective Assessment - 06/12/18 1707    Subjective   I think I am doing pretty good!    Pertinent History  Pt s/p R parietal-occipital, PICA watershed and R frontal CVA's, mutli infarct underlying ischemic disease. history of stroke in 2018- nearly full recovery;     Patient Stated Goals  get my vision and walking better    Currently in Pain?  No/denies                   OT Treatments/Exercises (OP) - 06/12/18 0001      ADLs   ADL Comments  Checked remaining goals - see goals for updates. Pt has met all OT goals at this time.       Neurological Re-education Exercises   Other Exercises 1  Upon testing, diplopia has resolved.  Smooth pursuits and saccades WFL's, as well as VOR.  Pt with mild gaze  instability, near greater than far, with vertical head movements. Pt with greatly improved dynamic balance and able tolerate head turns in all directions while walking with no LOB.  Pt does report some disequilibrium with sit to stand.  Pt reports she no longer uses rollator indoors and only occassionally outside.  Pt able to read for 15-20 minutes using magnifying glass. Pt was supposed to have cataract surgery just before she had her stroke - was told to reschdule for 6 months post stroke. Pt encouraged to call and make appt as soon as possible to assist with acuity.                 OT Short Term Goals - 03/15/18 1051      OT SHORT TERM GOAL #1   Title  XXXXX      OT SHORT TERM GOAL #2   Title  XXXXX      OT SHORT TERM GOAL #3   Title  XXXXXX      OT SHORT TERM GOAL #4   Title  XXXXXX      OT  SHORT TERM GOAL #5   Title  [id number]        OT Long Term Goals - 06/12/18 1734      OT LONG TERM GOAL #1   Title  Pt will be mod I with upgraded HEP for R/L Coordination as well as vision due 04/14/18    Time  4    Period  Weeks    Status  Achieved      OT LONG TERM GOAL #2   Title  Pt will demonstrate improved grip strength to at least 35 lbs R/L to assist with opening bottles or packages.  Check goals 06/05/2018     Baseline  R:  16LB, L: 25LB    Time  4    Period  Weeks    Status  Partially Met   06/12/2018 L=36, R = 20. Pt reports that she continues to have arthritis flair up in R hand and that is impacting grip strength.        OT LONG TERM GOAL #3   Title  Pt will demonstrate improved coordination as evidenced by decreasing time on 9 hole peg test to under 30 seconds in both hands to aide with typing, maipulating change/paper money, etc.      Baseline  R:  29.88, L:  37.87    Time  4    Period  Weeks    Status  Achieved   06/12/2018  L = 30.12, R = 19.32     OT LONG TERM GOAL #4   Title  Patient will navigate in moderately busy environment (e.g. grocery store) without hitting obstacles per patient's report    Time  4    Period  Weeks    Status  Achieved      OT LONG TERM GOAL #5   Title  Patient will demonstate compensatory strategies that will allow her to comfortably read for pleasure for 10-15 min at a time without eye strain, headache.      Time  4    Period  Weeks    Status  Achieved   using magnifying glass due to glaucoma           Plan - 06/12/18 1737    Clinical Impression Statement  Pt has met all OT goals. Pt is ready for d/c from OT    OT Occupational Profile and History  Detailed Assessment-  Review of Records and additional review of physical, cognitive, psychosocial history related to current functional performance    Occupational performance deficits (Please refer to evaluation for details):  ADL's;IADL's;Leisure    Body Structure / Function /  Physical Skills  ADL;Coordination;Endurance;GMC;UE functional use;Balance;Vestibular;Vision;Pain;IADL;Decreased knowledge of use of DME;Dexterity;FMC;Strength    Rehab Potential  Good    Clinical Decision Making  Several treatment options, min-mod task modification necessary    Comorbidities Affecting Occupational Performance:  Presence of comorbidities impacting occupational performance    Comorbidities impacting occupational performance description:  prior stroke with residual left sided hemiparesis, diabetes, hypertension    Modification or Assistance to Complete Evaluation   Min-Moderate modification of tasks or assist with assess necessary to complete eval    OT Frequency  1x / week    OT Duration  8 weeks    OT Treatment/Interventions  Self-care/ADL training;Therapeutic exercise;Visual/perceptual remediation/compensation;Patient/family education;Neuromuscular education;Balance training;Therapeutic activities;Functional Mobility Training;DME and/or AE instruction    Plan  d/c from OT    Consulted and Agree with Plan of Care  Patient       Patient will benefit from skilled therapeutic intervention in order to improve the following deficits and impairments:  Body Structure / Function / Physical Skills  Visit Diagnosis: Unsteadiness on feet  Muscle weakness (generalized)  Visuospatial deficit  Other lack of coordination    Problem List Patient Active Problem List   Diagnosis Date Noted  . Homonymous hemianopia, left 02/08/2018  . HTN (hypertension)   . Trigger finger, right index finger 10/17/2017  . Low back pain 07/29/2017  . Carotid artery stenosis 11/02/2016  . Left arm weakness   . Diastolic dysfunction   . Hypertension with heart disease   . Tobacco abuse   . Acute blood loss anemia   . Cerebral infarction due to embolism of right middle cerebral artery ([place])   . Perirectal abscess 09/26/2015  . Vitamin D deficiency 07/23/2015  . Osteopenia 02/16/2013  . Ankle  fracture 01/19/2013  . Anemia 09/27/2011  . Type II diabetes mellitus with nephropathy ([place]) 09/24/2011  . Hyperlipidemia 09/24/2011   OCCUPATIONAL THERAPY DISCHARGE SUMMARY  Visits from Start of Care: 6  Current functional level related to goals / functional outcomes: See above   Remaining deficits: High level balance deficits, field cut   Education / Equipment: HEP, reading guides Plan: Patient agrees to discharge.  Patient goals were met. Patient is being discharged due to meeting the stated rehab goals.  ?????     Quay Burow , OTR/L 06/12/2018, 5:46 PM  [facility] [address] [place], [place], [address] Phone: [phone]   Fax:  [phone]  Name: CHIANTI GOH MRN: [id number] Date of Birth: 21-Mar-1945

## 2018-06-13 ENCOUNTER — Encounter: Payer: Self-pay | Admitting: Physical Therapy

## 2018-06-13 NOTE — Therapy (Signed)
[facility] [address] [place], [place], [address] Phone: [phone]   Fax:  [phone]  Physical Therapy Treatment  Patient Details  Name: Sarah [name] MRN: [id number] Date of Birth: [birth year] Referring Provider (PT): Betty [name], MD   CLINIC OPERATION CHANGES: Outpatient Neuro Rehab is open at lower capacity following universal masking, social distancing, and patient screening.  The patient's COVID risk  of complications score is 4.  Encounter Date: 06/12/2018  PT End of Session - 06/13/18 2155    Visit Number  6    Number of Visits  13    Date for PT Re-Evaluation  06/23/18    Authorization Type  [employer] Medicare - 10th visit PN    PT Start Time  1733    PT Stop Time  1815    PT Time Calculation (min)  42 min    Activity Tolerance  Patient tolerated treatment well    Behavior During Therapy  WFL for tasks assessed/performed       Past Medical History:  Diagnosis Date  . Boils   . Glaucoma   . Heart murmur   . HTN (hypertension)   . Hx of adenomatous colonic polyps   . Hyperlipidemia   . Osteoporosis   . Pneumonia   . Stroke ([place])   . Type II or unspecified type diabetes mellitus without mention of complication, uncontrolled     Past Surgical History:  Procedure Laterality Date  . ABDOMINAL HYSTERECTOMY    . CARPAL TUNNEL RELEASE     right  . COLONOSCOPY  12-10-10   per Dr. Deatra Ina, clear, repeat in 7 yrs   . INCISION AND DRAINAGE PERIRECTAL ABSCESS N/A 09/26/2015   Procedure: IRRIGATION AND DEBRIDEMENT PERIRECTAL ABSCESS;  Surgeon: Mickeal Skinner, MD;  Location: [facility];  Service: General;  Laterality: N/A;  . KNEE ARTHROSCOPY     right  . LOOP RECORDER INSERTION N/A 10/19/2016   Procedure: LOOP RECORDER INSERTION;  Surgeon: Thompson Grayer, MD;  Location: [facility];  Service: Cardiovascular;  Laterality: N/A;  . ORIF ANKLE FRACTURE Right 03/24/2012   Procedure: OPEN REDUCTION  INTERNAL FIXATION (ORIF) ANKLE FRACTURE;  Surgeon: Newt Minion, MD;  Location: [facility];  Service: Orthopedics;  Laterality: Right;  Open Reduction Internal Fixation Right Bimalleolar ankle fracture  . POLYPECTOMY    . TEE WITHOUT CARDIOVERSION N/A 10/18/2016   Procedure: TRANSESOPHAGEAL ECHOCARDIOGRAM (TEE);  Surgeon: Fay Records, MD;  Location: [facility]OS[facility];  Service: Cardiovascular;  Laterality: N/A;  . TONSILLECTOMY      There were no vitals filed for this visit.  Subjective Assessment - 06/13/18 2144    Subjective  Pt reports she gets unsteady with sit to stand and must sit back down - says not all the time but the chair is behind her so she does    Pertinent History  DM2, CVA, HTN, osteoporosis, glaucoma, L visual field deficits    Patient Stated Goals  "I need to get strong"    Currently in Pain?  No/denies                       [facility] Adult PT Treatment/Exercise - 06/13/18 0001      Transfers   Transfers  Sit to Stand    Sit to Stand  5: Supervision    Number of Reps  10 reps    Comments  feet on blue Airex      Ambulation/Gait  Ambulation/Gait  Yes    Ambulation/Gait Assistance  5: Supervision    Ambulation Distance (Feet)  550 Feet    Assistive device  None    Gait Pattern  Step-through pattern    Ambulation Surface  Level;Indoor    Gait Comments  Amb. 115' around track tossing/catching ball - 20' backwards with CGA tossing ball          Balance Exercises - 06/13/18 2152      Balance Exercises: Standing   Standing Eyes Opened  Wide (BOA);Head turns;5 reps   horizontal head turns 5 reps    SLS with Vectors  Foam/compliant surface    Other Standing Exercises  marching forwards and backwards on blue mat on floor - with horizontal head turns with CGA to min assist           PT Short Term Goals - 06/12/18 1800      PT SHORT TERM GOAL #1   Title  patient to be independent with advanced/consolidated HEP for balance and strength    Status   Achieved      PT SHORT TERM GOAL #4   Title  patient to improve gait speed to >/= 3.5 ft/ sec for improved community navigation and safety    Baseline  9.56 secs = 3.43      PT SHORT TERM GOAL #5   Title  patient to ambulate 500' over various surfaces (even and uneven) as well as up/down inclines while scanning environment without LOB or instability without AD, with supervision    Status  Achieved        PT Long Term Goals - 04/24/18 0929      PT LONG TERM GOAL #1   Title  patient to be independent with advanced HEP for balance and strength    Time  8    Period  Weeks    Status  Revised    Target Date  06/23/18      PT LONG TERM GOAL #2   Title  Patient to improve Berg to >/= 55/56 demonstrating reduced fall risk    Time  8    Period  Weeks    Status  Revised    Target Date  06/23/18      PT LONG TERM GOAL #3   Title  patient to improve FGA to >/= 24/30 demonstrating improved dynamic balance    Time  8    Period  Weeks    Status  Revised    Target Date  06/23/18      PT LONG TERM GOAL #4   Title  patient to improve gait speed to >/= 3.8 ft/ sec for improved community navigation and safety    Time  8    Period  Weeks    Status  Revised    Target Date  06/23/18      PT LONG TERM GOAL #5   Title  patient to ambulate over various surfaces (even and uneven) as well as up/down inclines without LOB or instability without AD for >1000 feet, supervision    Time  8    Period  Weeks    Status  Revised    Target Date  06/23/18            Plan - 06/13/18 2156    Clinical Impression Statement  Pt progressing well towards goals - pt amb. outside on uneven pavement without device without LOB.  Anticipate D/C next session.    Personal Factors and  Comorbidities  Age;Transportation;Comorbidity 3+    Comorbidities  HTN, CVA, DM2, currently unable to drive, 73 y/o    Examination-Activity Limitations  Stairs;Locomotion Level    Examination-Participation Restrictions  Community  Activity;Driving;Shop    Stability/Clinical Decision Making  Evolving/Moderate complexity    Rehab Potential  Good    PT Frequency  1x / week    PT Duration  8 weeks    PT Treatment/Interventions  ADLs/Self Care Home Management;Electrical Stimulation;DME Instruction;Gait training;Stair training;Functional mobility training;Patient/family education;Neuromuscular re-education;Balance training;Therapeutic exercise;Therapeutic activities;Manual techniques;Visual/perceptual remediation/compensation;Vestibular    PT Next Visit Plan  consolidate her HEP!!! she has so much between PT and OT, make sure she is only doing what was mailed to her; continue to address gait and balance without AD toward updated goals.     Consulted and Agree with Plan of Care  Patient       Patient will benefit from skilled therapeutic intervention in order to improve the following deficits and impairments:  Abnormal gait, Decreased balance, Decreased strength, Decreased knowledge of use of DME, Difficulty walking, Impaired vision/preception, Postural dysfunction  Visit Diagnosis: Unsteadiness on feet  Other abnormalities of gait and mobility     Problem List Patient Active Problem List   Diagnosis Date Noted  . Homonymous hemianopia, left 02/08/2018  . HTN (hypertension)   . Trigger finger, right index finger 10/17/2017  . Low back pain 07/29/2017  . Carotid artery stenosis 11/02/2016  . Left arm weakness   . Diastolic dysfunction   . Hypertension with heart disease   . Tobacco abuse   . Acute blood loss anemia   . Cerebral infarction due to embolism of right middle cerebral artery ([place])   . Perirectal abscess 09/26/2015  . Vitamin D deficiency 07/23/2015  . Osteopenia 02/16/2013  . Ankle fracture 01/19/2013  . Anemia 09/27/2011  . Type II diabetes mellitus with nephropathy ([place]) 09/24/2011  . Hyperlipidemia 09/24/2011    Alda Lea, PT 06/13/2018, 10:00 PM  [facility] [address] [place], [place], [address] Phone: [phone]   Fax:  [phone]  Name: NADENE WITHERSPOON MRN: [id number] Date of Birth: Aug 04, 1945

## 2018-06-19 ENCOUNTER — Ambulatory Visit: Payer: Self-pay

## 2018-06-22 ENCOUNTER — Ambulatory Visit: Payer: HMO | Admitting: Physical Therapy

## 2018-06-22 ENCOUNTER — Other Ambulatory Visit: Payer: Self-pay

## 2018-06-22 DIAGNOSIS — R2689 Other abnormalities of gait and mobility: Secondary | ICD-10-CM

## 2018-06-22 DIAGNOSIS — M6281 Muscle weakness (generalized): Secondary | ICD-10-CM

## 2018-06-22 DIAGNOSIS — R2681 Unsteadiness on feet: Secondary | ICD-10-CM

## 2018-06-23 ENCOUNTER — Other Ambulatory Visit: Payer: Self-pay

## 2018-06-23 NOTE — Therapy (Addendum)
[facility] [address] [place], [place], [address] Phone: [phone]   Fax:  [phone]  Physical Therapy Treatment  Patient Details  Name: Sarah [name] MRN: [id number] Date of Birth: 12-Feb-1945 Referring Provider (PT): Betty [name], MD  CLINIC OPERATION CHANGES: Outpatient Neuro Rehab is open at lower capacity following universal masking, social distancing, and patient screening.  The patient's COVID risk of  complications score is 4.  Encounter Date: 06/22/2018  PT End of Session - 06/23/18 1511    Visit Number  7    Number of Visits  11    Date for PT Re-Evaluation  07/21/18   Renewal completed 06-23-18   Authorization Type  [facility] Medicare - 10th visit PN    Authorization Time Period  06-22-18 - 08-22-18    PT Start Time  1750    PT Stop Time  1838    PT Time Calculation (min)  48 min    Activity Tolerance  Patient tolerated treatment well    Behavior During Therapy  [facility] for tasks assessed/performed       Past Medical History:  Diagnosis Date  . Boils   . Glaucoma   . Heart murmur   . HTN (hypertension)   . Hx of adenomatous colonic polyps   . Hyperlipidemia   . Osteoporosis   . Pneumonia   . Stroke ([place])   . Type II or unspecified type diabetes mellitus without mention of complication, uncontrolled     Past Surgical History:  Procedure Laterality Date  . ABDOMINAL HYSTERECTOMY    . CARPAL TUNNEL RELEASE     right  . COLONOSCOPY  12-10-10   per Dr. Deatra Ina, clear, repeat in 7 yrs   . INCISION AND DRAINAGE PERIRECTAL ABSCESS N/A 09/26/2015   Procedure: IRRIGATION AND DEBRIDEMENT PERIRECTAL ABSCESS;  Surgeon: Mickeal Skinner, MD;  Location: [facility];  Service: General;  Laterality: N/A;  . KNEE ARTHROSCOPY     right  . LOOP RECORDER INSERTION N/A 10/19/2016   Procedure: LOOP RECORDER INSERTION;  Surgeon: Thompson Grayer, MD;  Location: [facility];  Service: Cardiovascular;  Laterality:  N/A;  . ORIF ANKLE FRACTURE Right 03/24/2012   Procedure: OPEN REDUCTION INTERNAL FIXATION (ORIF) ANKLE FRACTURE;  Surgeon: Newt Minion, MD;  Location: [facility];  Service: Orthopedics;  Laterality: Right;  Open Reduction Internal Fixation Right Bimalleolar ankle fracture  . POLYPECTOMY    . TEE WITHOUT CARDIOVERSION N/A 10/18/2016   Procedure: TRANSESOPHAGEAL ECHOCARDIOGRAM (TEE);  Surgeon: Fay Records, MD;  Location: [facility]OS[facility];  Service: Cardiovascular;  Laterality: N/A;  . TONSILLECTOMY      There were no vitals filed for this visit.  Subjective Assessment - 06/23/18 1501    Subjective  Pt reports she still has trouble with balance - would like to continue with PT for few more visits to work on balance instead of finishing up PT today    Pertinent History  DM2, CVA, HTN, osteoporosis, glaucoma, L visual field deficits    Patient Stated Goals  "I need to get strong"    Currently in Pain?  No/denies         [facility] PT Assessment - 06/23/18 0001      Berg Balance Test   Sit to Stand  Able to stand without using hands and stabilize independently    Standing Unsupported  Able to stand safely 2 minutes    Sitting with Back Unsupported but Feet Supported on Floor or Stool  Able to sit safely and securely 2 minutes    Stand to Sit  Sits safely with minimal use of hands    Transfers  Able to transfer safely, minor use of hands    Standing Unsupported with Eyes Closed  Able to stand 10 seconds safely    Standing Unsupported with Feet Together  Able to place feet together independently and stand 1 minute safely    From Standing, Reach Forward with Outstretched Arm  Can reach confidently >25 cm (10")    From Standing Position, Pick up Object from Floor  Able to pick up shoe safely and easily    From Standing Position, Turn to Look Behind Over each Shoulder  Looks behind from both sides and weight shifts well    Turn 360 Degrees  Able to turn 360 degrees safely in 4 seconds or less    Standing  Unsupported, Alternately Place Feet on Step/Stool  Able to stand independently and safely and complete 8 steps in 20 seconds    Standing Unsupported, One Foot in Front  Able to place foot tandem independently and hold 30 seconds    Standing on One Leg  Able to lift leg independently and hold 5-10 seconds   7.5   Total Score  55      Functional Gait  Assessment   Gait assessed   Yes    Gait Level Surface  Walks 20 ft in less than 5.5 sec, no assistive devices, good speed, no evidence for imbalance, normal gait pattern, deviates no more than 6 in outside of the 12 in walkway width.   4.97   Change in Gait Speed  Able to smoothly change walking speed without loss of balance or gait deviation. Deviate no more than 6 in outside of the 12 in walkway width.    Gait with Horizontal Head Turns  Performs head turns smoothly with no change in gait. Deviates no more than 6 in outside 12 in walkway width    Gait with Vertical Head Turns  Performs head turns with no change in gait. Deviates no more than 6 in outside 12 in walkway width.    Gait and Pivot Turn  Pivot turns safely within 3 sec and stops quickly with no loss of balance.    Step Over Obstacle  Is able to step over 2 stacked shoe boxes taped together (9 in total height) without changing gait speed. No evidence of imbalance.    Gait with Narrow Base of Support  Ambulates 7-9 steps.    Gait with Eyes Closed  Walks 20 ft, slow speed, abnormal gait pattern, evidence for imbalance, deviates 10-15 in outside 12 in walkway width. Requires more than 9 sec to ambulate 20 ft.    Ambulating Backwards  Walks 20 ft, uses assistive device, slower speed, mild gait deviations, deviates 6-10 in outside 12 in walkway width.    Steps  Alternating feet, must use rail.    Total Score  25        NeuroRe-ed:  Pt performed marching on blue Airex with CGA 10 reps each leg;  Performed amb. On tiptoes 15'x 4 reps along counter Amb. On heels 15' x 2 reps along counter  with CGA for safety           [facility] Adult PT Treatment/Exercise - 06/23/18 0001      Transfers   Transfers  Sit to Stand    Sit to Stand  5: Supervision    Number of  Reps  Other reps (comment)   5   Comments  feet on blue Airex      Ambulation/Gait   Ambulation/Gait  Yes    Ambulation/Gait Assistance  5: Supervision    Ambulation Distance (Feet)  350 Feet    Assistive device  None    Gait Pattern  Step-through pattern    Ambulation Surface  Level;Indoor    Gait velocity  9.56 secs = 3.43 ft/sec without device    Stairs  Yes    Stairs Assistance  5: Supervision    Stair Management Technique  Two rails;Alternating pattern;Forwards    Number of Stairs  4    Height of Stairs  6        Plan - 07/04/18 2135    Personal Factors and Comorbidities  Age;Transportation;Comorbidity 3+    Comorbidities  HTN, CVA, DM2, currently unable to drive, 73 y/o    Examination-Activity Limitations  Stairs;Locomotion Level    Examination-Participation Restrictions  Community Activity;Driving;Shop    Stability/Clinical Decision Making  Evolving/Moderate complexity    Rehab Potential  Good    PT Frequency  1x / week    PT Duration  4 weeks    PT Treatment/Interventions  ADLs/Self Care Home Management;Electrical Stimulation;DME Instruction;Gait training;Stair training;Functional mobility training;Patient/family education;Neuromuscular re-education;Balance training;Therapeutic exercise;Therapeutic activities;Manual techniques;Visual/perceptual remediation/compensation;Vestibular    PT Next Visit Plan  continue dynamic balance training - various surfaces; multi tasking with gait activities    Consulted and Agree with Plan of Care  Patient              PT Short Term Goals - 06/23/18 1503      PT SHORT TERM GOAL #1   Title  patient to be independent with advanced/consolidated HEP for balance and strength    Time  4    Period  Weeks    Status  New    Target Date  05/24/18      PT  SHORT TERM GOAL #2   Title  Patient to improve Berg to >/= 53/56 demonstrating reduced fall risk    Baseline  51/56    Time  4    Period  Weeks    Status  New    Target Date  05/24/18      PT SHORT TERM GOAL #3   Title  patient to improve FGA to >/= 22/30 demonstrating improved dynamic balance    Baseline  20/30    Time  4    Period  Weeks    Status  New    Target Date  05/24/18      PT SHORT TERM GOAL #4   Title  patient to improve gait speed to >/= 3.5 ft/ sec for improved community navigation and safety    Baseline  3.27 ft/sec     Time  4    Period  Weeks    Status  New    Target Date  05/24/18      PT SHORT TERM GOAL #5   Title  patient to ambulate 500' over various surfaces (even and uneven) as well as up/down inclines while scanning environment without LOB or instability without AD, with supervision    Time  4    Period  Weeks    Status  New    Target Date  05/24/18        PT Long Term Goals - 06/22/18 1747      PT LONG TERM GOAL #1   Title  patient to be  independent with advanced HEP for balance and strength    Baseline  met 06-22-18    Time  8    Period  Weeks    Status  Achieved      PT LONG TERM GOAL #2   Title  Patient to improve Berg to >/= 55/56 demonstrating reduced fall risk    Baseline  55/56 on 06-22-18    Time  8    Period  Weeks    Status  Achieved      PT LONG TERM GOAL #3   Title  patient to improve FGA to >/= 24/30 demonstrating improved dynamic balance:  Revised goal - increase score to >/= 27/30 for reduced fall risk    Baseline  25/30 on 06-22-18    Time  8    Period  Weeks    Status  Revised    Target Date  07/21/18      PT LONG TERM GOAL #4   Title  patient to improve gait speed to >/= 3.8 ft/ sec for improved community navigation and safety    Baseline  3.43 ft/sec on 06-22-18    Time  8    Period  Weeks    Status  On-going    Target Date  07/21/18      PT LONG TERM GOAL #5   Title  patient to ambulate over various surfaces  (even and uneven) as well as up/down inclines without LOB or instability without AD for >1000 feet, supervision    Time  8    Period  Weeks    Status  Unable to assess   due to rain during PT time on 06-22-18 - GOAL CONTINUED   Target Date  07/21/18            Plan - 06/23/18 1531    Comorbidities  --       Patient will benefit from skilled therapeutic intervention in order to improve the following deficits and impairments:  Abnormal gait, Decreased balance, Decreased strength, Decreased knowledge of use of DME, Difficulty walking, Impaired vision/preception, Postural dysfunction  Visit Diagnosis: Unsteadiness on feet - Plan: PT plan of care cert/re-cert  Muscle weakness (generalized) - Plan: PT plan of care cert/re-cert  Other abnormalities of gait and mobility - Plan: PT plan of care cert/re-cert     Problem List Patient Active Problem List   Diagnosis Date Noted  . Homonymous hemianopia, left 02/08/2018  . HTN (hypertension)   . Trigger finger, right index finger 10/17/2017  . Low back pain 07/29/2017  . Carotid artery stenosis 11/02/2016  . Left arm weakness   . Diastolic dysfunction   . Hypertension with heart disease   . Tobacco abuse   . Acute blood loss anemia   . Cerebral infarction due to embolism of right middle cerebral artery ([place])   . Perirectal abscess 09/26/2015  . Vitamin D deficiency 07/23/2015  . Osteopenia 02/16/2013  . Ankle fracture 01/19/2013  . Anemia 09/27/2011  . Type II diabetes mellitus with nephropathy ([employer]) 09/24/2011  . Hyperlipidemia 09/24/2011    Alda Lea, PT 06/23/2018, 3:35 PM  [facility] [address] [place], [place], [address] Phone: [phone]   Fax:  [phone]  Name: ARACELI ARANGO MRN: [id number] Date of Birth: 1945/06/07

## 2018-06-23 NOTE — Patient Outreach (Signed)
  [facility]) Care Management Chronic Special Needs Program  06/23/2018  Name: Sarah [name] DOB: May 29, 1945  MRN: [id number]  [name] is enrolled in a chronic special needs plan for Diabetes. Chronic Care Management Coordinator telephoned client to review health risk assessment and to develop individualized care plan.  Introduced the chronic care management program, importance of client participation, and taking their care plan to all provider appointments and inpatient facilities.    Subjective: client reports history of DM, stroke, HTN. She reports she was scheduled for cataract surgery, but it had to be postponed. She reports a supportive family. She reports her sister takes her to her appointments. She also attends outpatient rehab therapy. Client reports a history of reaching the "donut hole". Client also ask about [facility] eye drops and cost. She is receptive to a pharmacy referral.   Goals Addressed            This Visit's Progress   . Client understands the importance of follow-up with providers by attending scheduled visits   On track    Upcoming appointment with primary care and neurologist next week.    . Client will report no fall or injuries in the next 9 months.   On track   . COMPLETED: Client will use Assistive Devices as needed and verbalize understanding of device use       Denies any questions regarding use of glucometer    . Client will verbalize knowledge of self management of Hypertension as evidences by BP reading of 140/90 or less; or as defined by provider   On track   . HEMOGLOBIN A1C < 7.0       Diabetes self management actions: Glucose monitoring per provider recommendations Eat Healthy Check feet daily Visit provider every 3-6 months as directed Hbg A1C level every 3-6 months. Eye Exam yearly    . Maintain timely refills of diabetic medication as prescribed within the year .   On track   . Obtain annual  Lipid  Profile, LDL-C   On track   . Obtain Annual Eye (retinal)  Exam    On track   . Obtain Annual Foot Exam   On track   . Obtain annual screen for micro albuminuria (urine) , nephropathy (kidney problems)   On track   . Obtain Hemoglobin A1C at least 2 times per year   On track   . Visit Primary Care Provider or Endocrinologist at least 2 times per year    On track     Covid 19 precautions discussed. RNCM reinforced the 24 hour nurse advice line. RNCM encouraged client to contact health care concierge for benefit questions. Client encouraged to call RNCM as needed/Confirmed he has contact number.  Plan:  Send successful outreach letter with a copy of their individualized care plan and Send individual care plan to provider. Chronic care management coordination will outreach in: 6 months. Will refer client to:  Pharmacy   Sarah Silversmith, RN, MSN, [facility] [phone]

## 2018-06-26 ENCOUNTER — Other Ambulatory Visit: Payer: Self-pay

## 2018-06-26 ENCOUNTER — Telehealth: Payer: Self-pay

## 2018-06-26 ENCOUNTER — Ambulatory Visit (INDEPENDENT_AMBULATORY_CARE_PROVIDER_SITE_OTHER): Payer: HMO

## 2018-06-26 ENCOUNTER — Encounter: Payer: Self-pay | Admitting: Family Medicine

## 2018-06-26 ENCOUNTER — Ambulatory Visit (INDEPENDENT_AMBULATORY_CARE_PROVIDER_SITE_OTHER): Payer: HMO | Admitting: Family Medicine

## 2018-06-26 ENCOUNTER — Other Ambulatory Visit: Payer: Self-pay | Admitting: Family Medicine

## 2018-06-26 VITALS — BP 130/80 | HR 88 | Temp 98.5°F | Resp 12 | Wt 145.0 lb

## 2018-06-26 DIAGNOSIS — K219 Gastro-esophageal reflux disease without esophagitis: Secondary | ICD-10-CM | POA: Diagnosis not present

## 2018-06-26 DIAGNOSIS — R05 Cough: Secondary | ICD-10-CM | POA: Diagnosis not present

## 2018-06-26 DIAGNOSIS — I119 Hypertensive heart disease without heart failure: Secondary | ICD-10-CM

## 2018-06-26 DIAGNOSIS — R059 Cough, unspecified: Secondary | ICD-10-CM

## 2018-06-26 DIAGNOSIS — E1121 Type 2 diabetes mellitus with diabetic nephropathy: Secondary | ICD-10-CM

## 2018-06-26 DIAGNOSIS — E559 Vitamin D deficiency, unspecified: Secondary | ICD-10-CM

## 2018-06-26 LAB — BASIC METABOLIC PANEL
BUN: 11 mg/dL (ref 6–23)
CO2: 25 mEq/L (ref 19–32)
Calcium: 9.5 mg/dL (ref 8.4–10.5)
Chloride: 105 mEq/L (ref 96–112)
Creatinine, Ser: 0.52 mg/dL (ref 0.40–1.20)
GFR: 140.69 mL/min (ref >60.00)
Glucose, Bld: 181 mg/dL — ABNORMAL HIGH (ref 70–99)
Potassium: 3.9 mEq/L (ref 3.5–5.1)
Sodium: 139 mEq/L (ref 135–145)

## 2018-06-26 LAB — HEMOGLOBIN A1C: Hgb A1c MFr Bld: 10.3 % — ABNORMAL HIGH (ref 4.6–6.5)

## 2018-06-26 LAB — VITAMIN D 25 HYDROXY (VIT D DEFICIENCY, FRACTURES): VITD: 39.17 ng/mL (ref 30.00–100.00)

## 2018-06-26 MED ORDER — LOSARTAN POTASSIUM 50 MG PO TABS: 50.0000 mg | ORAL_TABLET | Freq: Every day | ORAL | 1 refills | Status: DC

## 2018-06-26 MED ORDER — FAMOTIDINE 40 MG PO TABS: 40.0000 mg | ORAL_TABLET | Freq: Every day | ORAL | 0 refills | Status: DC

## 2018-06-26 NOTE — Patient Instructions (Signed)
A few things to remember from today's visit:   Type II diabetes mellitus with nephropathy ([place]) - Plan: Fructosamine, Hemoglobin A1c  Hypertension with heart disease - Plan: Basic metabolic panel  Vitamin D deficiency - Plan: VITAMIN D 25 Hydroxy (Vit-D Deficiency, Fractures)  Cough - Plan: DG Chest 2 View  Gastroesophageal reflux disease, esophagitis presence not specified - Plan: famotidine (PEPCID) 40 MG tablet  Omeprazole 30 min before breakfast. Pepcid at bedtime. Lisinopril changed to Losartan. Monitor blood pressure.   Please be sure medication list is accurate. If a new problem present, please set up appointment sooner than planned today.

## 2018-06-26 NOTE — Assessment & Plan Note (Signed)
Probably can be contributing to persistent cough. Recommend taking omeprazole 20 mg 30 minutes before breakfast. Pepcid 40 mg added at bedtime to take for 3 to 4 weeks. GERD precautions also recommended.

## 2018-06-26 NOTE — Assessment & Plan Note (Signed)
BP otherwise adequately controlled. Recommend monitoring BP at home, adequate technique and a BP monitor with appropriate cuff. Continue low-salt diet. No changes in current management.

## 2018-06-26 NOTE — Progress Notes (Signed)
HPI:   [name] is a 73 y.o. female, who is here today for chronic disease management.   DM2, currently she is on Lantus 25 units twice daily and Victoza 1.8 mg daily. Denies hypoglycemic events. Denies abdominal pain, nausea,vomiting, polydipsia,polyuria, or polyphagia.  She has tried to follow dietary recommendations but for the past few weeks she has been eating more sweets and carbs, so she has had some BS in the 200s.  Last hemoglobin A1c was 13.5 in 12/2017.  Hypertension: Currently she is on amlodipine 5 mg daily and losartan 50 mg daily. She is not checking BP regularly, a few days ago BP was 173/91, taking with a wrist BP monitor. She denies unusual headache, visual changes, new focal deficit, chest pain, dyspnea, palpitation, gross hematuria, decreased urine output, or edema.  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 Currently she is on Plavix 75 mg daily and atorvastatin 80 mg daily.  Vitamin D deficiency, she is on ergocalciferol 50,000 units weekly. Last 25 OH vitamin D was low at 20.1 in 12/2017.  Today she is c/o persistent non productive cough for about 4-5 weeks. No associated wheezing or dyspnea. Cough is worse at night when lying down.  Negative for more fatigue than usual,body aches,decreased appetite. She has not tried OTC medication.  + Heartburn   Review of Systems  Constitutional: Negative for chills and fever.  HENT: Negative for nosebleeds and sore throat.   Genitourinary: Negative for decreased urine volume and hematuria.   Musculoskeletal: Negative for myalgias.  Skin: Negative for rash and wound.  Psychiatric/Behavioral: Negative for confusion and hallucinations. The patient is not nervous/anxious.   Rest see pertinent positives and negatives per HPI.   Current Outpatient Medications on File Prior to Visit  Medication Sig Dispense Refill  . acetaminophen (TYLENOL) 500 MG tablet Take 1,000 mg by mouth every 6 (six) hours as needed for mild pain or headache.    Marland Kitchen amLODipine (NORVASC) 5 MG tablet Take 1.5 tablets (7.5 mg total) by mouth daily. 135 tablet 1  . atorvastatin (LIPITOR) 80 MG tablet Take 1 tablet (80 mg total) by mouth daily at 6 PM. 30 tablet 0  . Blood Glucose Calibration (OT ULTRA/FASTTK CNTRL SOLN) SOLN     . calcium carbonate (TUMS EX) 750 MG chewable tablet Chew 1 tablet by mouth as needed for heartburn.    . clopidogrel (PLAVIX) 75 MG tablet TAKE 1 TABLET BY MOUTH DAILY 90 tablet 2  . Insulin Pen Needle (BD PEN NEEDLE NANO U/F) 32G X 4 MM MISC USE UP TO FOUR TIMES DAILY 200 each 2  . Lancet Devices (SIMPLE DIAGNOSTICS LANCING DEV) MISC Check BS before and 2 hours after meals. 200 each 6  . LANTUS SOLOSTAR  100 UNIT/ML Solostar Pen INJECT 25 UNITS INTO THE SKIN TWICE DAILY 15 mL 3  . liraglutide (VICTOZA) 18 MG/3ML SOPN 1.8 mcg daily. 3 pen 3  . omeprazole (PRILOSEC) 20 MG capsule Take 1 capsule (20 mg total) by mouth daily. 30 capsule 3  . ONE TOUCH ULTRA TEST test strip Check BS before and 2 hours after meals. 200 each 6  . PHARMACIST CHOICE ALCOHOL 70 % PADS     . ROCKLATAN 0.02-0.005 % SOLN Place 1 drop into both eyes every evening.    . TRAVATAN Z 0.004 % SOLN ophthalmic solution Place 1 drop into both eyes at bedtime.  11  . Vitamin D, Ergocalciferol, (DRISDOL) 1.25 MG (50000 UT) CAPS capsule TAKE ONE CAPSULE BY MOUTH TWICE A WEEK ON MONDAY AND THURSDAY 8 capsule 0   No current facility-administered medications on file prior to visit.      Past Medical History:  Diagnosis Date  .  Boils   . Glaucoma   . Heart murmur   . HTN (hypertension)   . Hx of adenomatous colonic polyps   . Hyperlipidemia   . Osteoporosis   . Pneumonia   . Stroke ([place])   . Type II or unspecified type diabetes mellitus without mention of complication, uncontrolled    No Known Allergies  Social History   Socioeconomic History  . Marital status: Single    Spouse name: Not on file  . Number of children: Not on file  . Years of education: Not on file  . Highest education level: Not on file  Occupational History  . Not on file  Social Needs  . Financial resource strain: Not on file  . Food insecurity    Worry: Not on file    Inability: Not on file  . Transportation needs    Medical: Not on file    Non-medical: Not on file  Tobacco Use  . Smoking status: Former Smoker    Types: Cigarettes    Quit date: 10/15/2016    Years since quitting: 1.7  . Smokeless tobacco: Never Used  . Tobacco comment: smokes occ.   Substance and Sexual Activity  . Alcohol use: Yes    Alcohol/week: 0.0 standard drinks    Comment: occ  . Drug use: No  . Sexual activity: Not on file  Lifestyle  . Physical activity    Days per week: Not on file    Minutes per session: Not on file  . Stress: Not on file  Relationships  . Social Herbalist on phone: Not on file    Gets together: Not on file    Attends religious service: Not on file    Active member of club or organization: Not on file    Attends meetings of clubs or organizations: Not on file    Relationship status: Not on file  Other Topics Concern  . Not on file  Social History Narrative  . Not on file    Vitals:   06/26/18 0938  BP: 130/80  Pulse: 88  Resp: 12  Temp: 98.5 F (36.9 C)  SpO2: 97%   Body mass index is 28.32 kg/m.    Physical Exam  Nursing note and vitals reviewed. Constitutional: She is oriented to person, place, and time. She appears well-developed. No distress.  HENT:  Head: Normocephalic and  atraumatic.  Mouth/Throat: Oropharynx is clear and moist and mucous membranes are normal.  Eyes: Pupils are equal, round, and reactive to light. Conjunctivae  are normal.  Cardiovascular: Normal rate and regular rhythm.  No murmur heard. Pulses:      Dorsalis pedis pulses are 2+ on the right side and 2+ on the left side.  Respiratory: Effort normal and breath sounds normal. No respiratory distress.  GI: Soft. She exhibits no mass. There is no hepatomegaly. There is no abdominal tenderness.  Musculoskeletal:        General: No edema.  Lymphadenopathy:    She has no cervical adenopathy.  Neurological: She is alert and oriented to person, place, and time. She has normal strength. No cranial nerve deficit.  Skin: Skin is warm. No rash noted. No erythema.  Psychiatric: She has a normal mood and affect.  Well groomed, good eye contact.     ASSESSMENT AND PLAN:  [name] was seen today for follow-up.  Diagnoses and all orders for this visit:  Orders Placed This Encounter  Procedures  . DG Chest 2 View  . Fructosamine  . Hemoglobin A1c  . Basic metabolic panel  . VITAMIN D 25 Hydroxy (Vit-D Deficiency, Fractures)   Lab Results  Component Value Date   CREATININE 0.52 06/26/2018   BUN 11 06/26/2018   NA 139 06/26/2018   K 3.9 06/26/2018   CL 105 06/26/2018   CO2 25 06/26/2018   Lab Results  Component Value Date   HGBA1C 10.3 (H) 06/26/2018     Cough Possible etiologies discussed, GERD,allegies,medication. Instructed about warning signs. Lisinopril discontinued.  -     DG Chest 2 View  Hypertension with heart disease BP otherwise adequately controlled. Recommend monitoring BP at home, adequate technique and a BP monitor with appropriate cuff. Continue low-salt diet. No changes in current management.   Type II diabetes mellitus with nephropathy ([place]) Probably has not been well controlled. No changes in current management, treatment will be adjusted depending of  A1c result. Educated about the importance of following dietary recommendations. We discussed complications of elevated glucose. Eye exam is current.   Vitamin D deficiency No changes in current management, will follow labs done today and will give further recommendations accordingly.   GERD (gastroesophageal reflux disease) Probably can be contributing to persistent cough. Recommend taking omeprazole 20 mg 30 minutes before breakfast. Pepcid 40 mg added at bedtime to take for 3 to 4 weeks. GERD precautions also recommended.   Return in about 4 months (around 10/26/2018).   -[name] was advised to return sooner than planned today if new concerns arise.       Keriana Sarsfield G. [name], MD  [facility]. [place] office.

## 2018-06-26 NOTE — Assessment & Plan Note (Signed)
Probably has not been well controlled. No changes in current management, treatment will be adjusted depending of A1c result. Educated about the importance of following dietary recommendations. We discussed complications of elevated glucose. Eye exam is current.

## 2018-06-26 NOTE — Telephone Encounter (Signed)
I called pt that her visit with Janett Billow NP will be mychart video due to Covid 19 pandemic. Pt has a mychart account and a cell phone with a camera. She gave verbal consent to do video and to file insurance. PT was told to log onto mychart 10 minutes early before appt for video visit. I updated pts meds pcp and pharmacy with pt. She verbalized understanding.

## 2018-06-26 NOTE — Assessment & Plan Note (Signed)
No changes in current management, will follow labs done today and will give further recommendations accordingly.  

## 2018-06-26 NOTE — Telephone Encounter (Signed)
Left vm for patient that her appt will be change to a mychart video visit due to COVID 19. I stated to call back to give verbal consent to do video and to file insurance.

## 2018-06-27 ENCOUNTER — Ambulatory Visit (INDEPENDENT_AMBULATORY_CARE_PROVIDER_SITE_OTHER): Payer: HMO | Admitting: *Deleted

## 2018-06-27 ENCOUNTER — Telehealth (INDEPENDENT_AMBULATORY_CARE_PROVIDER_SITE_OTHER): Payer: HMO | Admitting: Adult Health

## 2018-06-27 ENCOUNTER — Encounter: Payer: Self-pay | Admitting: Family Medicine

## 2018-06-27 DIAGNOSIS — I10 Essential (primary) hypertension: Secondary | ICD-10-CM | POA: Diagnosis not present

## 2018-06-27 DIAGNOSIS — I639 Cerebral infarction, unspecified: Secondary | ICD-10-CM | POA: Diagnosis not present

## 2018-06-27 DIAGNOSIS — Z794 Long term (current) use of insulin: Secondary | ICD-10-CM

## 2018-06-27 DIAGNOSIS — E1165 Type 2 diabetes mellitus with hyperglycemia: Secondary | ICD-10-CM | POA: Diagnosis not present

## 2018-06-27 DIAGNOSIS — H53462 Homonymous bilateral field defects, left side: Secondary | ICD-10-CM

## 2018-06-27 DIAGNOSIS — I63411 Cerebral infarction due to embolism of right middle cerebral artery: Secondary | ICD-10-CM | POA: Diagnosis not present

## 2018-06-27 DIAGNOSIS — E785 Hyperlipidemia, unspecified: Secondary | ICD-10-CM | POA: Diagnosis not present

## 2018-06-27 DIAGNOSIS — IMO0002 Reserved for concepts with insufficient information to code with codable children: Secondary | ICD-10-CM

## 2018-06-27 LAB — CUP PACEART REMOTE DEVICE CHECK
Date Time Interrogation Session: 20200616155020
Implantable Pulse Generator Implant Date: 20181009

## 2018-06-27 NOTE — Progress Notes (Signed)
[facility]  PATIENT: Sarah [name] DOB: November 17, 1945   REASON FOR VISIT: Follow-up for stroke right MCA 10/2016 readmitted 02/08/2018 for left hemianopsia here for hospital follow-up HISTORY FROM: Patient   Virtual Visit via Telephone Note  I connected with Sarah [name] on 06/27/18 at  3:45 PM EDT by telephone located remotely within my own home and verified that I am speaking with the correct person using two identifiers who reports being located at her own home. Initially scheduled for MyChart visit but due to connection difficulties, visit transitioned to telephone visit with patients consent.     Visit scheduled by Katharine Look, RN. She discussed the limitations, risks, security and privacy concerns of performing an evaluation and management service by telephone and the availability of in person appointments. I also discussed with the patient that there may be a patient responsible charge related to this service. The patient expressed understanding and agreed to proceed. See telephone note for consent and additional scheduling information.     HISTORY OF PRESENT ILLNESS: 06/27/2018 VIRTUAL VISIT: Sarah [name] is being seen today for three-month follow-up visit regarding recent stroke.  Initially scheduled for office visit follow-up but due to COVID-19 safety precautions, visit transition to telemedicine via MyChart with patient's consent.  She has been stable from a stroke standpoint with residual left homonymous hemianopsia and balance difficulties.  She continues to participate in outpatient PT with ongoing improvement.  Continues on Plavix and atorvastatin for secondary stroke prevention without reported side effects. Blood pressure monitored at home.  Lab work obtained yesterday with A1c 10.3 (prior 13.5).  She continues to follow with PCP for ongoing management.  Loop recorder has not shown atrial fibrillation thus far.  No further concerns at this  time.  Denies new or worsening stroke/TIA symptoms.    Hospital follow-up 03/23/2018 CM: Sarah [name], 73 year old female returns for follow-up with readmission for stroke 1/29/ 2020.  MRI of the brain acute cortical and subcortical RPO infarcts.  She initially presented with left hemianopsia.  She is now on Plavix and aspirin for 3 months and then Plavix alone.  She has not had further stroke or TIA symptoms.  She has minimal bruising and no bleeding.  Hemoglobin A1c 13.5 on hospital admission.  She claims she is trying to get that down to a more reasonable level.  She remains on Lipitor without myalgias.  Blood pressure in the office today 146/87.  She continues to go to [facility].  She is to start rehab physical therapy and occupational therapy today.  Loop recorder so far no atrial fibrillation.  She is not driving due to her vision.  She returns for reevaluation   HOSPITAL SUMMARY: right parieto-occipital infarcts in setting of progressive R MCA stenosis, infarcts secondary to large vessel disease source  Resultant L hemianopia   CT head R parietal watershed infarct. Small vessel disease.   MRI  Acute cortical and subcortical R P-O infarcts. Old R CR and R frontal lobe infarcts. Small vessel disease.   MRA head  No LVO. Focal stenosis R M1, R M2/M3, L M1  MRA neck B VA origin atherosclerosis 30-50%  2D Echo  pending   Loop interrogation - last check 1/3 w/o AF - interrogation today without AF  LDL 76  HgbA1c 13.5  Lovenox 40 mg sq daily for VTE prophylaxis  clopidogrel 75 mg daily prior to admission, now on aspirin 325 mg daily and clopidogrel 75 mg daily. Continue DAPT x 3  months then plavix alone  Therapy recommendations:  OP OT and PT  Disposition:  Home  Patient advised not to drive          REVIEW OF SYSTEMS: Full 14 system review of systems performed and notable only for those listed, all others are neg:  Visual loss and balance  deficits  ALLERGIES: No Known Allergies  HOME MEDICATIONS:   PAST MEDICAL HISTORY: Past Medical History:  Diagnosis Date   Boils    Glaucoma    Heart murmur    HTN (hypertension)    Hx of adenomatous colonic polyps    Hyperlipidemia    Osteoporosis    Pneumonia    Stroke ([place])    Type II or unspecified type diabetes mellitus without mention of complication, uncontrolled     PAST SURGICAL HISTORY: Past Surgical History:  Procedure Laterality Date   ABDOMINAL HYSTERECTOMY     CARPAL TUNNEL RELEASE     right   COLONOSCOPY  12-10-10   per Dr. Deatra Ina, clear, repeat in 7 yrs    [place] N/A 09/26/2015   Procedure: IRRIGATION AND DEBRIDEMENT PERIRECTAL ABSCESS;  Surgeon: Mickeal Skinner, MD;  Location: [facility];  Service: General;  Laterality: N/A;   KNEE ARTHROSCOPY     right   LOOP RECORDER INSERTION N/A 10/19/2016   Procedure: LOOP RECORDER INSERTION;  Surgeon: Thompson Grayer, MD;  Location: [facility];  Service: Cardiovascular;  Laterality: N/A;   ORIF ANKLE FRACTURE Right 03/24/2012   Procedure: OPEN REDUCTION INTERNAL FIXATION (ORIF) ANKLE FRACTURE;  Surgeon: Newt Minion, MD;  Location: [facility];  Service: Orthopedics;  Laterality: Right;  Open Reduction Internal Fixation Right Bimalleolar ankle fracture   POLYPECTOMY     TEE WITHOUT CARDIOVERSION N/A 10/18/2016   Procedure: TRANSESOPHAGEAL ECHOCARDIOGRAM (TEE);  Surgeon: Fay Records, MD;  Location: [facility];  Service: Cardiovascular;  Laterality: N/A;   TONSILLECTOMY      FAMILY HISTORY: Family History  Problem Relation Age of Onset   Diabetes Mother    Hypertension Mother    Stroke Father    Stroke Maternal Uncle    Stroke Paternal Uncle    Colon cancer Neg Hx    Esophageal cancer Neg Hx    Rectal cancer Neg Hx    Stomach cancer Neg Hx     SOCIAL HISTORY: Social History   Socioeconomic History   Marital status: Single    Spouse  name: Not on file   Number of children: Not on file   Years of education: Not on file   Highest education level: Not on file  Occupational History   Not on file  Social Needs   Financial resource strain: Not on file   Food insecurity    Worry: Not on file    Inability: Not on file   Transportation needs    Medical: Not on file    Non-medical: Not on file  Tobacco Use   Smoking status: Former Smoker    Types: Cigarettes    Quit date: 10/15/2016    Years since quitting: 1.6   Smokeless tobacco: Never Used   Tobacco comment: smokes occ.   Substance and Sexual Activity   Alcohol use: Yes    Alcohol/week: 0.0 standard drinks    Comment: occ   Drug use: No   Sexual activity: Not on file  Lifestyle   Physical activity    Days per week: Not on file    Minutes per session:  Not on file   Stress: Not on file  Relationships   Social connections    Talks on phone: Not on file    Gets together: Not on file    Attends religious service: Not on file    Active member of club or organization: Not on file    Attends meetings of clubs or organizations: Not on file    Relationship status: Not on file   Intimate partner violence    Fear of current or ex partner: Not on file    Emotionally abused: Not on file    Physically abused: Not on file    Forced sexual activity: Not on file  Other Topics Concern   Not on file  Social History Narrative   Not on file     PHYSICAL EXAM  General: well developed, well nourished, pleasant elderly AA female, seated, in no evident distress Head: head normocephalic and atraumatic.    Neurologic Exam Mental Status: Awake and fully alert. Oriented to place and time. Subjective vision deficits. Recent and remote memory intact. Attention span, concentration and fund of knowledge appropriate. Mood and affect appropriate.  Cranial Nerves: Extraocular movements full without nystagmus. Hearing intact to voice. Facial sensation intact.  Face, tongue, palate moves normally and symmetrically.  Shoulder shrug symmetric. Motor: No evidence of weakness per drift assessment Sensory.: intact to light touch Coordination: Rapid alternating movements normal in all extremities. Finger-to-nose and heel-to-shin performed accurately bilaterally. Gait and Station: Arises from chair without difficulty. Stance is normal. Gait demonstrates normal stride length and balance .  Reflexes: UTA  DIAGNOSTIC DATA (LABS, IMAGING, TESTING) - I reviewed patient records, labs, notes, testing and imaging myself where available.  Lab Results  Component Value Date   WBC 6.3 02/14/2018   HGB 11.7 (L) 02/14/2018   HCT 34.8 (L) 02/14/2018   MCV 92.8 02/14/2018   PLT 377.0 02/14/2018      Component Value Date/Time   NA 139 06/26/2018 1027   K 3.9 06/26/2018 1027   CL 105 06/26/2018 1027   CO2 25 06/26/2018 1027   GLUCOSE 181 (H) 06/26/2018 1027   BUN 11 06/26/2018 1027   CREATININE 0.52 06/26/2018 1027   CALCIUM 9.5 06/26/2018 1027   PROT 6.5 02/08/2018 1143   ALBUMIN 2.8 (L) 02/08/2018 1143   AST 14 (L) 02/08/2018 1143   ALT 10 02/08/2018 1143   ALKPHOS 74 02/08/2018 1143   BILITOT 0.4 02/08/2018 1143   GFRNONAA >60 02/08/2018 1143   GFRAA >60 02/08/2018 1143   Lab Results  Component Value Date   CHOL 152 02/09/2018   HDL 49 02/09/2018   LDLCALC 76 02/09/2018   TRIG 134 02/09/2018   CHOLHDL 3.1 02/09/2018   Lab Results  Component Value Date   HGBA1C 10.3 (H) 06/26/2018        ASSESSMENT AND PLAN    Sarah [name] is a 73 y.o. female with history of hypertension, hyperlipidemia, prior right MCA stroke with no gross deficits 2018 but very minimal left hand fine motor deficits, diabetes presenting to the hospital 02/08/2018 with left homonymous hemianopsia, MRI cortical and subcortical RPO infarcts.  Residual deficits of left homonymous hemianopia and gait difficulties.     Plan:  -continue plavix and continue lipitor  for stroke prevention -continue to follow with PCP for HTN, HLD and DM management -monitor loop recorder for atrial fibrillation -Continue to monitor blood pressure at home -Continue stay active and maintain a healthy diet - maintain blood pressure goal <  130/80, diabetes with hemoglobin A1c goal below 7.0% and lipids with LDL cholesterol goal below 70 mg/dL.   Follow-up in 6 months or call earlier if needed  I spent 15 minutes in total non-face to face time with the patient more than 50% of which was spent counseling and coordination of care, reviewing test results reviewing medications and discussing and reviewing the diagnosis of stroke and management of risk factors and further treatment options.  No driving until further notice  Venancio Poisson, AGNP-BC  [facility] [address] [place], [place] [address]  Phone [phone] Fax [phone] Note: This document was prepared with digital dictation and possible smart phrase technology. Any transcriptional errors that result from this process are unintentional.

## 2018-06-28 ENCOUNTER — Encounter: Payer: Self-pay | Admitting: Adult Health

## 2018-06-28 LAB — FRUCTOSAMINE: Fructosamine: 286 umol/L — ABNORMAL HIGH (ref 205–285)

## 2018-06-28 NOTE — Progress Notes (Signed)
I agree with the above plan 

## 2018-06-30 ENCOUNTER — Ambulatory Visit: Payer: Self-pay | Admitting: Family Medicine

## 2018-06-30 NOTE — Telephone Encounter (Signed)
Charted in result notes. 

## 2018-07-03 MED ORDER — METFORMIN HCL 500 MG PO TABS: 500.0000 mg | ORAL_TABLET | Freq: Two times a day (BID) | ORAL | 0 refills | Status: DC

## 2018-07-03 NOTE — Addendum Note (Signed)
Addended by: Rebecca Eaton on: 07/03/2018 10:23 AM   Modules accepted: Orders

## 2018-07-05 ENCOUNTER — Other Ambulatory Visit: Payer: Self-pay

## 2018-07-05 ENCOUNTER — Ambulatory Visit: Payer: HMO | Admitting: Physical Therapy

## 2018-07-05 ENCOUNTER — Encounter: Payer: Self-pay | Admitting: Physical Therapy

## 2018-07-05 DIAGNOSIS — R2689 Other abnormalities of gait and mobility: Secondary | ICD-10-CM

## 2018-07-05 DIAGNOSIS — R2681 Unsteadiness on feet: Secondary | ICD-10-CM | POA: Diagnosis not present

## 2018-07-05 DIAGNOSIS — M6281 Muscle weakness (generalized): Secondary | ICD-10-CM

## 2018-07-06 NOTE — Therapy (Signed)
[place] [address] [place], [place], [address] Phone: [phone]   Fax:  [phone]  Physical Therapy Treatment  Patient Details  Name: Sarah [name] MRN: [id number] Date of Birth: 1945-08-30 Referring Provider (PT): Betty [name], MD   Encounter Date: 07/05/2018  PT End of Session - 07/05/18 1005    Visit Number  8    Number of Visits  11    Date for PT Re-Evaluation  07/21/18   Renewal completed 06-23-18   Authorization Type  [facility] Medicare - 10th visit PN    Authorization Time Period  06-22-18 - 08-22-18    PT Start Time  1002    PT Stop Time  1042    PT Time Calculation (min)  40 min    Equipment Utilized During Treatment  Gait belt    Activity Tolerance  Patient tolerated treatment well    Behavior During Therapy  [facility] for tasks assessed/performed       Past Medical History:  Diagnosis Date  . Boils   . Glaucoma   . Heart murmur   . HTN (hypertension)   . Hx of adenomatous colonic polyps   . Hyperlipidemia   . Osteoporosis   . Pneumonia   . Stroke ([place])   . Type II or unspecified type diabetes mellitus without mention of complication, uncontrolled     Past Surgical History:  Procedure Laterality Date  . ABDOMINAL HYSTERECTOMY    . CARPAL TUNNEL RELEASE     right  . COLONOSCOPY  12-10-10   per Dr. Deatra Ina, clear, repeat in 7 yrs   . INCISION AND DRAINAGE PERIRECTAL ABSCESS N/A 09/26/2015   Procedure: IRRIGATION AND DEBRIDEMENT PERIRECTAL ABSCESS;  Surgeon: Mickeal Skinner, MD;  Location: [place];  Service: General;  Laterality: N/A;  . KNEE ARTHROSCOPY     right  . LOOP RECORDER INSERTION N/A 10/19/2016   Procedure: LOOP RECORDER INSERTION;  Surgeon: Thompson Grayer, MD;  Location[facility];  Service: Cardiovascular;  Laterality: N/A;  . ORIF ANKLE FRACTURE Right 03/24/2012   Procedure: OPEN REDUCTION INTERNAL FIXATION (ORIF) ANKLE FRACTURE;  Surgeon: Newt Minion, MD;  Location:  [place];  Service: Orthopedics;  Laterality: Right;  Open Reduction Internal Fixation Right Bimalleolar ankle fracture  . POLYPECTOMY    . TEE WITHOUT CARDIOVERSION N/A 10/18/2016   Procedure: TRANSESOPHAGEAL ECHOCARDIOGRAM (TEE);  Surgeon: Fay Records, MD;  Location: [facility] ENDOSCOPY;  Service: Cardiovascular;  Laterality: N/A;  . TONSILLECTOMY      There were no vitals filed for this visit.  Subjective Assessment - 07/05/18 1005    Subjective  No new complaints. No falls or pain to report. States the HEP is going well at home.    Pertinent History  DM2, CVA, HTN, osteoporosis, glaucoma, L visual field deficits    Patient Stated Goals  "I need to get strong"    Currently in Pain?  No/denies    Pain Score  0-No pain            07/05/18 1007  Transfers  Transfers Sit to Stand;Stand to Sit  Sit to Stand 6: Modified independent (Device/Increase time)  Stand to Sit 6: Modified independent (Device/Increase time)  Ambulation/Gait  Ambulation/Gait Yes  Ambulation/Gait Assistance 6: Modified independent (Device/Increase time)  Ambulation/Gait Assistance Details no balance issues noted with gait this session  Ambulation Distance (Feet) 1000 Feet  Assistive device None  Gait Pattern Step-through pattern  Ambulation Surface Level;Unlevel;Indoor;Outdoor;Paved;Gravel;Grass  High Level  Balance  High Level Balance Activities Marching forwards;Marching backwards;Tandem walking (tandem fwd/bwd, toe walk fwd/bwd)  High Level Balance Comments on red/blue mats with no UE suppport: 3 laps each with min guard to min assist. cues on form and technique.         07/05/18 1030  Balance Exercises: Standing  SLS with Vectors Foam/compliant surface;Other reps (comment);Limitations  Balance Beam standing across blue foam balance beam: alternating forward heel taps to floor/back onot beam, then alternating backward toe taps to floor/back onto beam. min assist with touch to bars for balance. cues on posture,  step length and weight shifting needed.  Balance Exercises: Standing  SLS with Vectors Limitations cones along edges of both the red/blue mat: alternating toe tap with each foot with side stepping to next cone for 1 lap each way, then progressed to alternating double toe taps to cones with side stepping for 1 lap toward each way. up to min assist for balance, cues for stance position and weight shifting.        PT Short Term Goals - 06/23/18 1503      PT SHORT TERM GOAL #1   Title  patient to be independent with advanced/consolidated HEP for balance and strength    Time  4    Period  Weeks    Status  New    Target Date  05/24/18      PT SHORT TERM GOAL #2   Title  Patient to improve Berg to >/= 53/56 demonstrating reduced fall risk    Baseline  51/56    Time  4    Period  Weeks    Status  New    Target Date  05/24/18      PT SHORT TERM GOAL #3   Title  patient to improve FGA to >/= 22/30 demonstrating improved dynamic balance    Baseline  20/30    Time  4    Period  Weeks    Status  New    Target Date  05/24/18      PT SHORT TERM GOAL #4   Title  patient to improve gait speed to >/= 3.5 ft/ sec for improved community navigation and safety    Baseline  3.27 ft/sec     Time  4    Period  Weeks    Status  New    Target Date  05/24/18      PT SHORT TERM GOAL #5   Title  patient to ambulate 500' over various surfaces (even and uneven) as well as up/down inclines while scanning environment without LOB or instability without AD, with supervision    Time  4    Period  Weeks    Status  New    Target Date  05/24/18        PT Long Term Goals - 06/22/18 1747      PT LONG TERM GOAL #1   Title  patient to be independent with advanced HEP for balance and strength    Baseline  met 06-22-18    Time  8    Period  Weeks    Status  Achieved      PT LONG TERM GOAL #2   Title  Patient to improve Berg to >/= 55/56 demonstrating reduced fall risk    Baseline  55/56 on 06-22-18     Time  8    Period  Weeks    Status  Achieved      PT LONG TERM  GOAL #3   Title  patient to improve FGA to >/= 24/30 demonstrating improved dynamic balance:  Revised goal - increase score to >/= 27/30 for reduced fall risk    Baseline  25/30 on 06-22-18    Time  8    Period  Weeks    Status  Revised    Target Date  07/21/18      PT LONG TERM GOAL #4   Title  patient to improve gait speed to >/= 3.8 ft/ sec for improved community navigation and safety    Baseline  3.43 ft/sec on 06-22-18    Time  8    Period  Weeks    Status  On-going    Target Date  07/21/18      PT LONG TERM GOAL #5   Title  patient to ambulate over various surfaces (even and uneven) as well as up/down inclines without LOB or instability without AD for >1000 feet, supervision    Time  8    Period  Weeks    Status  Unable to assess   due to rain during PT time on 06-22-18 - GOAL CONTINUED   Target Date  07/21/18            Plan - 07/05/18 1006    Clinical Impression Statement  Today's skilled session focused on gait on various surfaces and balance reactions. No issues reported or noted with session. The pt is progressing well and should benefit from continued PT to progress toward unmet goals.    Personal Factors and Comorbidities  Age;Transportation;Comorbidity 3+    Comorbidities  HTN, CVA, DM2, currently unable to drive, 73 y/o    Examination-Activity Limitations  Stairs;Locomotion Level    Examination-Participation Restrictions  Community Activity;Driving;Shop    Stability/Clinical Decision Making  Evolving/Moderate complexity    Rehab Potential  Good    PT Frequency  1x / week    PT Duration  4 weeks    PT Treatment/Interventions  ADLs/Self Care Home Management;Electrical Stimulation;DME Instruction;Gait training;Stair training;Functional mobility training;Patient/family education;Neuromuscular re-education;Balance training;Therapeutic exercise;Therapeutic activities;Manual techniques;Visual/perceptual  remediation/compensation;Vestibular    PT Next Visit Plan  continue dynamic balance training - various surfaces; multi tasking with gait activities; treamill to address gait speed- with/without inclines, try walking down hill as well and sideways    Consulted and Agree with Plan of Care  Patient       Patient will benefit from skilled therapeutic intervention in order to improve the following deficits and impairments:  Abnormal gait, Decreased balance, Decreased strength, Decreased knowledge of use of DME, Difficulty walking, Impaired vision/preception, Postural dysfunction  Visit Diagnosis: 1. Unsteadiness on feet   2. Muscle weakness (generalized)   3. Other abnormalities of gait and mobility        Problem List Patient Active Problem List   Diagnosis Date Noted  . GERD (gastroesophageal reflux disease) 06/26/2018  . Homonymous hemianopia, left 02/08/2018  . HTN (hypertension)   . Trigger finger, right index finger 10/17/2017  . Low back pain 07/29/2017  . Carotid artery stenosis 11/02/2016  . Left arm weakness   . Diastolic dysfunction   . Hypertension with heart disease   . Tobacco abuse   . Acute blood loss anemia   . Cerebral infarction due to embolism of right middle cerebral artery ([place])   . Perirectal abscess 09/26/2015  . Vitamin D deficiency 07/23/2015  . Osteopenia 02/16/2013  . Ankle fracture 01/19/2013  . Anemia 09/27/2011  . Type II diabetes mellitus with nephropathy (  [place]) 09/24/2011  . Hyperlipidemia 09/24/2011    Willow Ora, PTA, [facility] [address], [place], [place] [address] [phone] 07/06/18, 6:56 PM   Name: Sarah [name] MRN: [id number] Date of Birth: [birth year]

## 2018-07-07 NOTE — Progress Notes (Signed)
[facility] Summary Report / Loop Recorder 

## 2018-07-11 ENCOUNTER — Ambulatory Visit: Payer: HMO | Admitting: Physical Therapy

## 2018-07-12 ENCOUNTER — Ambulatory Visit: Payer: HMO | Admitting: Physical Therapy

## 2018-07-12 ENCOUNTER — Other Ambulatory Visit: Payer: Self-pay | Admitting: Family Medicine

## 2018-07-12 DIAGNOSIS — H25812 Combined forms of age-related cataract, left eye: Secondary | ICD-10-CM | POA: Diagnosis not present

## 2018-07-17 ENCOUNTER — Telehealth: Payer: Self-pay

## 2018-07-17 NOTE — Telephone Encounter (Signed)
Clearance form for [facility] fax to [phone]. Per the form pt is cleared medically for the [facility] sed[facility] at [facility]. The form was done by Dr.Sethi. Form fax twice and confirmed.

## 2018-07-18 ENCOUNTER — Other Ambulatory Visit: Payer: Self-pay

## 2018-07-18 ENCOUNTER — Ambulatory Visit: Payer: HMO | Attending: Family Medicine | Admitting: Physical Therapy

## 2018-07-18 ENCOUNTER — Encounter: Payer: Self-pay | Admitting: Physical Therapy

## 2018-07-18 DIAGNOSIS — R2681 Unsteadiness on feet: Secondary | ICD-10-CM

## 2018-07-18 DIAGNOSIS — R2689 Other abnormalities of gait and mobility: Secondary | ICD-10-CM | POA: Diagnosis not present

## 2018-07-18 DIAGNOSIS — M6281 Muscle weakness (generalized): Secondary | ICD-10-CM | POA: Diagnosis not present

## 2018-07-18 NOTE — Therapy (Signed)
[place] [address] [place], [place], [address] Phone: [phone]   Fax:  [phone]  Physical Therapy Treatment  Patient Details  Name: Sarah [name] MRN: [id number] Date of Birth: 1945/06/04 Referring Provider (PT): Betty [name], MD   Encounter Date: 07/18/2018   CLINIC OPERATION CHANGES: Outpatient [facility] is open at lower capacity following universal masking, social distancing, and patient screening.   PT End of Session - 07/18/18 1507    Visit Number  9    Number of Visits  11    Date for PT Re-Evaluation  07/21/18   Renewal completed 06-23-18   Authorization Type  [facility] Medicare - 10th visit PN    Authorization Time Period  06-22-18 - 08-22-18    PT Start Time  1506    PT Stop Time  1544    PT Time Calculation (min)  38 min    Equipment Utilized During Treatment  Gait belt    Activity Tolerance  Patient tolerated treatment well    Behavior During Therapy  WFL for tasks assessed/performed       Past Medical History:  Diagnosis Date  . Boils   . Glaucoma   . Heart murmur   . HTN (hypertension)   . Hx of adenomatous colonic polyps   . Hyperlipidemia   . Osteoporosis   . Pneumonia   . Stroke ([place])   . Type II or unspecified type diabetes mellitus without mention of complication, uncontrolled     Past Surgical History:  Procedure Laterality Date  . ABDOMINAL HYSTERECTOMY    . CARPAL TUNNEL RELEASE     right  . COLONOSCOPY  12-10-10   per Dr. Deatra Ina, clear, repeat in 7 yrs   . INCISION AND DRAINAGE PERIRECTAL ABSCESS N/A 09/26/2015   Procedure: IRRIGATION AND DEBRIDEMENT PERIRECTAL ABSCESS;  Surgeon: Mickeal Skinner, MD;  Location: [place];  Service: General;  Laterality: N/A;  . KNEE ARTHROSCOPY     right  . LOOP RECORDER INSERTION N/A 10/19/2016   Procedure: LOOP RECORDER INSERTION;  Surgeon: Thompson Grayer, MD;  Location: [place] [facility];  Service: Cardiovascular;  Laterality:  N/A;  . ORIF ANKLE FRACTURE Right 03/24/2012   Procedure: OPEN REDUCTION INTERNAL FIXATION (ORIF) ANKLE FRACTURE;  Surgeon: Newt Minion, MD;  Location: [place];  Service: Orthopedics;  Laterality: Right;  Open Reduction Internal Fixation Right Bimalleolar ankle fracture  . POLYPECTOMY    . TEE WITHOUT CARDIOVERSION N/A 10/18/2016   Procedure: TRANSESOPHAGEAL ECHOCARDIOGRAM (TEE);  Surgeon: Fay Records, MD;  Location: [facility] ENDOSCOPY;  Service: Cardiovascular;  Laterality: N/A;  . TONSILLECTOMY      There were no vitals filed for this visit.  Subjective Assessment - 07/18/18 1507    Subjective  No new complaitns. No falls or pain to report.    Pertinent History  DM2, CVA, HTN, osteoporosis, glaucoma, L visual field deficits    Patient Stated Goals  "I need to get strong"    Currently in Pain?  No/denies    Pain Score  0-No pain         [facility] PT Assessment - 07/18/18 1508      Functional Gait  Assessment   Gait assessed   Yes    Gait Level Surface  Walks 20 ft in less than 5.5 sec, no assistive devices, good speed, no evidence for imbalance, normal gait pattern, deviates no more than 6 in outside of the 12 in walkway width.  Change in Gait Speed  Able to smoothly change walking speed without loss of balance or gait deviation. Deviate no more than 6 in outside of the 12 in walkway width.    Gait with Horizontal Head Turns  Performs head turns smoothly with no change in gait. Deviates no more than 6 in outside 12 in walkway width    Gait with Vertical Head Turns  Performs head turns with no change in gait. Deviates no more than 6 in outside 12 in walkway width.    Gait and Pivot Turn  Pivot turns safely within 3 sec and stops quickly with no loss of balance.    Step Over Obstacle  Is able to step over 2 stacked shoe boxes taped together (9 in total height) without changing gait speed. No evidence of imbalance.    Gait with Narrow Base of Support  Ambulates 7-9 steps.    Gait with Eyes  Closed  Walks 20 ft, no assistive devices, good speed, no evidence of imbalance, normal gait pattern, deviates no more than 6 in outside 12 in walkway width. Ambulates 20 ft in less than 7 sec.    Ambulating Backwards  Walks 20 ft, no assistive devices, good speed, no evidence for imbalance, normal gait    Steps  Alternating feet, must use rail.    Total Score  28    FGA comment:  28/30= low risk for falling            [facility] Adult PT Treatment/Exercise - 07/18/18 1508      Transfers   Transfers  Sit to Stand;Stand to Sit    Sit to Stand  6: Modified independent (Device/Increase time)    Stand to Sit  6: Modified independent (Device/Increase time)      Ambulation/Gait   Ambulation/Gait  Yes    Ambulation/Gait Assistance  6: Modified independent (Device/Increase time)    Ambulation/Gait Assistance Details  had pt engaged in manual task of tossing hacky sack ball between hands, while engaged in conversation with no balance issues or toe scuffing noted.     Ambulation Distance (Feet)  1000 Feet    Assistive device  None    Gait Pattern  Step-through pattern    Ambulation Surface  Level;Indoor    Gait velocity  10.65 sec's (preferred pace)- 3.08 ft/sec no AD; 7.47 sec's (fast pace)-4.40 ft/sec      Self-Care   Self-Care  Other Self-Care Comments    Other Self-Care Comments   no issues with current HEP, plans to return to [facility] when they reopen.       Neuro Re-ed    Neuro Re-ed Details   for balance/muscle strengthening: on treadmill with bil UE support- 1.4-1.5 mph for 2 minutes forward uphill and then backwards downhill, cues for step length and to clear feet with stepping.            PT Short Term Goals - 06/23/18 1503      PT SHORT TERM GOAL #1   Title  patient to be independent with advanced/consolidated HEP for balance and strength    Time  4    Period  Weeks    Status  New    Target Date  05/24/18      PT SHORT TERM GOAL #2   Title  Patient to improve Berg to >/=  53/56 demonstrating reduced fall risk    Baseline  51/56    Time  4    Period  Weeks  Status  New    Target Date  05/24/18      PT SHORT TERM GOAL #3   Title  patient to improve FGA to >/= 22/30 demonstrating improved dynamic balance    Baseline  20/30    Time  4    Period  Weeks    Status  New    Target Date  05/24/18      PT SHORT TERM GOAL #4   Title  patient to improve gait speed to >/= 3.5 ft/ sec for improved community navigation and safety    Baseline  3.27 ft/sec     Time  4    Period  Weeks    Status  New    Target Date  05/24/18      PT SHORT TERM GOAL #5   Title  patient to ambulate 500' over various surfaces (even and uneven) as well as up/down inclines while scanning environment without LOB or instability without AD, with supervision    Time  4    Period  Weeks    Status  New    Target Date  05/24/18        PT Long Term Goals - 07/18/18 1521      PT LONG TERM GOAL #1   Title  patient to be independent with advanced HEP for balance and strength    Baseline  met 06-22-18    Status  Achieved      PT LONG TERM GOAL #2   Title  Patient to improve Berg to >/= 55/56 demonstrating reduced fall risk    Baseline  55/56 on 06-22-18    Status  Achieved      PT LONG TERM GOAL #3   Title  patient to improve FGA to >/= 24/30 demonstrating improved dynamic balance:  Revised goal - increase score to >/= 27/30 for reduced fall risk    Baseline  07/18/18: 28/30 scored today    Time  --    Period  --    Status  Achieved      PT LONG TERM GOAL #4   Title  patient to improve gait speed to >/= 3.8 ft/ sec for improved community navigation and safety    Baseline  07/18/18: 3.08 ft/sec preferred pace, then 4.40 ft/sec fast pace, no AD    Time  --    Period  --    Status  Achieved      PT LONG TERM GOAL #5   Title  patient to ambulate over various surfaces (even and uneven) as well as up/down inclines without LOB or instability without AD for >1000 feet, supervision     Baseline  07/18/18: met today    Status  Achieved   due to rain during PT time on 06-22-18 - [place] - 07/18/18 1508    Clinical Impression Statement  Pt stated she was ready to wrap up today as she felt she is "good" now. All LTGs met. Primary PT agreeable to discharge today.    Personal Factors and Comorbidities  Age;Transportation;Comorbidity 3+    Comorbidities  HTN, CVA, DM2, currently unable to drive, 73 y/o    Examination-Activity Limitations  Stairs;Locomotion Level    Examination-Participation Restrictions  Community Activity;Driving;Shop    Stability/Clinical Decision Making  Evolving/Moderate complexity    Rehab Potential  Good    PT Frequency  1x / week    PT Duration  4 weeks  PT Treatment/Interventions  ADLs/Self Care Home Management;Electrical Stimulation;DME Instruction;Gait training;Stair training;Functional mobility training;Patient/family education;Neuromuscular re-education;Balance training;Therapeutic exercise;Therapeutic activities;Manual techniques;Visual/perceptual remediation/compensation;Vestibular    PT Next Visit Plan  discharge today with goals met    Consulted and Agree with Plan of Care  Patient       Patient will benefit from skilled therapeutic intervention in order to improve the following deficits and impairments:  Abnormal gait, Decreased balance, Decreased strength, Decreased knowledge of use of DME, Difficulty walking, Impaired vision/preception, Postural dysfunction  Visit Diagnosis: 1. Unsteadiness on feet   2. Muscle weakness (generalized)   3. Other abnormalities of gait and mobility        Problem List Patient Active Problem List   Diagnosis Date Noted  . GERD (gastroesophageal reflux disease) 06/26/2018  . Homonymous hemianopia, left 02/08/2018  . HTN (hypertension)   . Trigger finger, right index finger 10/17/2017  . Low back pain 07/29/2017  . Carotid artery stenosis 11/02/2016  . Left arm weakness   .  Diastolic dysfunction   . Hypertension with heart disease   . Tobacco abuse   . Acute blood loss anemia   . Cerebral infarction due to embolism of right middle cerebral artery ([place])   . Perirectal abscess 09/26/2015  . Vitamin D deficiency 07/23/2015  . Osteopenia 02/16/2013  . Ankle fracture 01/19/2013  . Anemia 09/27/2011  . Type II diabetes mellitus with nephropathy ([place]) 09/24/2011  . Hyperlipidemia 09/24/2011    Willow Ora, PTA, [facility] [address] [place], [place] [address] [phone] 07/18/18, 3:53 PM   Name: Sarah [name] MRN: [id number] Date of Birth: 1945/03/25

## 2018-07-23 ENCOUNTER — Other Ambulatory Visit: Payer: Self-pay | Admitting: Family Medicine

## 2018-07-23 DIAGNOSIS — K219 Gastro-esophageal reflux disease without esophagitis: Secondary | ICD-10-CM

## 2018-07-25 ENCOUNTER — Ambulatory Visit: Payer: HMO | Admitting: Physical Therapy

## 2018-07-27 ENCOUNTER — Encounter: Payer: Self-pay | Admitting: Family Medicine

## 2018-07-31 ENCOUNTER — Ambulatory Visit (INDEPENDENT_AMBULATORY_CARE_PROVIDER_SITE_OTHER): Payer: HMO | Admitting: *Deleted

## 2018-07-31 DIAGNOSIS — I639 Cerebral infarction, unspecified: Secondary | ICD-10-CM

## 2018-07-31 LAB — CUP PACEART REMOTE DEVICE CHECK
Date Time Interrogation Session: 20200719164158
Implantable Pulse Generator Implant Date: 20181009

## 2018-08-07 ENCOUNTER — Other Ambulatory Visit: Payer: Self-pay

## 2018-08-07 ENCOUNTER — Encounter: Payer: Self-pay | Admitting: Family Medicine

## 2018-08-07 ENCOUNTER — Ambulatory Visit: Payer: HMO | Admitting: Family Medicine

## 2018-08-07 NOTE — Progress Notes (Signed)
Attempted to reach pt via phone in regards to appt for elevated bs, however no answer.  VM left.  Grier Mitts, MD

## 2018-08-08 ENCOUNTER — Ambulatory Visit: Payer: HMO | Admitting: Family Medicine

## 2018-08-08 ENCOUNTER — Encounter: Payer: Self-pay | Admitting: Family Medicine

## 2018-08-08 ENCOUNTER — Other Ambulatory Visit: Payer: Self-pay | Admitting: Family Medicine

## 2018-08-08 ENCOUNTER — Other Ambulatory Visit: Payer: Self-pay

## 2018-08-08 ENCOUNTER — Ambulatory Visit (INDEPENDENT_AMBULATORY_CARE_PROVIDER_SITE_OTHER): Payer: HMO | Admitting: Family Medicine

## 2018-08-08 DIAGNOSIS — Z794 Long term (current) use of insulin: Secondary | ICD-10-CM

## 2018-08-08 DIAGNOSIS — K047 Periapical abscess without sinus: Secondary | ICD-10-CM

## 2018-08-08 DIAGNOSIS — I10 Essential (primary) hypertension: Secondary | ICD-10-CM

## 2018-08-08 DIAGNOSIS — IMO0002 Reserved for concepts with insufficient information to code with codable children: Secondary | ICD-10-CM

## 2018-08-08 DIAGNOSIS — H269 Unspecified cataract: Secondary | ICD-10-CM

## 2018-08-08 DIAGNOSIS — E1165 Type 2 diabetes mellitus with hyperglycemia: Secondary | ICD-10-CM | POA: Diagnosis not present

## 2018-08-08 NOTE — Progress Notes (Signed)
Virtual Visit via Telephone Note  I connected with Sarah [name] on 08/08/18 at  3:30 PM EDT by telephone and verified that I am speaking with the correct person using two identifiers.   I discussed the limitations, risks, security and privacy concerns of performing an evaluation and management service by telephone and the availability of in person appointments. I also discussed with the patient that there may be a patient responsible charge related to this service. The patient expressed understanding and agreed to proceed.  Location patient: home Location provider: work or home office Participants present for the call: patient, provider Patient did not have a visit in the prior 7 days to address this/these issue(s).   History of Present Illness: Pt is a 73 yo female HTN, CAD, GERD, DM II with neuropathy, CVA with embolism of R middle cerebral artery, vitamin D def seen by Dr. [name].  Pt states she was scheduled for cataract surgery on Monday.  Over the weekend she started having tooth pain.  Pt did not eat nor take her insulin or metformin prior to the procedure, but took her other meds.  Her bs was 193 the am of surgery and she vomited once. The surgery was cancelled and an appt was made by Ophthalmology for pt to be seen.  Pt has since seen her dentist.  She was given an abx for dental abscess.  Pt plans on having her cataract surgery then the dental work.  Pt states she feels good.  DM II: Fsbs was 185 this am.  Pt states this is "good for me".  States hgb A1C was 10.3% in June.  Pt has stopped drinking pepsi.  Pt likes pasta, bread, and sometimes dessert.  Pt was planning to go to nutrition classes but had a stoke, then COVID-19 pandemic happened.  HTN:  bp 136/?.  Pt having difficulty reading the bottom number 2/2 her cataracts.   Observations/Objective: Patient sounds cheerful and well on the phone. I do not appreciate any SOB. Speech and thought processing are grossly  intact. Patient reported vitals:  Assessment and Plan: Uncontrolled type 2 diabetes mellitus with insulin therapy ([place]) -continue current medications -lifestyle modifications strongly encouraged -pt advised to f/u with pcp in the next few wks -given precautions  Essential hypertension -continue current meds -discussed lifestyle modifications  Dental abscess  -continue abx as prescribed by Dentist -f/u with Dentistry prn   Cataract, unspecified cataract type, unspecified laterality  -discussed the importance of good glycemic control -surgery to be rescheduled -continue f/u with Ophthalmology  Follow Up Instructions: F/u in the next 2 wks with pcp for DM  99441 5-10 99442 11-20 9443 21-30 I did not refer this patient for an OV in the next 24 hours for this/these issue(s).  I discussed the assessment and treatment plan with the patient. The patient was provided an opportunity to ask questions and all were answered. The patient agreed with the plan and demonstrated an understanding of the instructions.   The patient was advised to call back or seek an in-person evaluation if the symptoms worsen or if the condition fails to improve as anticipated.  I provided 12 minutes of non-face-to-face time during this encounter.   Billie Ruddy, MD

## 2018-08-11 ENCOUNTER — Other Ambulatory Visit: Payer: Self-pay | Admitting: Family Medicine

## 2018-08-15 NOTE — Progress Notes (Signed)
[facility] Summary Report / Loop Recorder 

## 2018-08-17 NOTE — Telephone Encounter (Signed)
I called [facility] at [phone] and stated clearance form was fax a month ago. I stated it was fax to [phone]. They stated it was fax to the corporate office. They advise It will be fax again.

## 2018-08-17 NOTE — Telephone Encounter (Signed)
[facility] called and wanted to know if clearance form was received , form sent 08/04 and 08/05 , pt was unable to get surgery in July but surgery is scheduled for 08/10

## 2018-08-17 NOTE — Telephone Encounter (Signed)
Fax clearance form back and they receive it twice. Sarah [name] stated they receive the first form a month ago but pt had to cancel the procedure. They have to obtain a new PA if it expires after 30 days. They appreciate the new clearance form that was fax today.

## 2018-08-21 DIAGNOSIS — H25812 Combined forms of age-related cataract, left eye: Secondary | ICD-10-CM | POA: Diagnosis not present

## 2018-08-21 DIAGNOSIS — H2512 Age-related nuclear cataract, left eye: Secondary | ICD-10-CM | POA: Diagnosis not present

## 2018-09-01 ENCOUNTER — Ambulatory Visit (INDEPENDENT_AMBULATORY_CARE_PROVIDER_SITE_OTHER): Payer: HMO | Admitting: *Deleted

## 2018-09-01 DIAGNOSIS — I639 Cerebral infarction, unspecified: Secondary | ICD-10-CM | POA: Diagnosis not present

## 2018-09-03 LAB — CUP PACEART REMOTE DEVICE CHECK
Date Time Interrogation Session: 20200821174110
Implantable Pulse Generator Implant Date: 20181009

## 2018-09-08 NOTE — Progress Notes (Signed)
[facility] Summary Report / Loop Recorder 

## 2018-09-22 ENCOUNTER — Other Ambulatory Visit: Payer: Self-pay | Admitting: Family Medicine

## 2018-10-04 ENCOUNTER — Other Ambulatory Visit: Payer: Self-pay | Admitting: *Deleted

## 2018-10-04 ENCOUNTER — Ambulatory Visit (INDEPENDENT_AMBULATORY_CARE_PROVIDER_SITE_OTHER): Payer: HMO | Admitting: *Deleted

## 2018-10-04 DIAGNOSIS — I639 Cerebral infarction, unspecified: Secondary | ICD-10-CM

## 2018-10-04 MED ORDER — METFORMIN HCL 500 MG PO TABS: 500.0000 mg | ORAL_TABLET | Freq: Two times a day (BID) | ORAL | 0 refills | Status: DC

## 2018-10-05 ENCOUNTER — Other Ambulatory Visit: Payer: Self-pay | Admitting: Family Medicine

## 2018-10-05 LAB — CUP PACEART REMOTE DEVICE CHECK
Date Time Interrogation Session: 20200923194029
Implantable Pulse Generator Implant Date: 20181009

## 2018-10-09 DIAGNOSIS — H2511 Age-related nuclear cataract, right eye: Secondary | ICD-10-CM | POA: Diagnosis not present

## 2018-10-09 DIAGNOSIS — H25811 Combined forms of age-related cataract, right eye: Secondary | ICD-10-CM | POA: Diagnosis not present

## 2018-10-10 NOTE — Progress Notes (Signed)
[facility] Summary Report / Loop Recorder 

## 2018-10-16 ENCOUNTER — Other Ambulatory Visit: Payer: Self-pay | Admitting: Family Medicine

## 2018-10-16 DIAGNOSIS — I119 Hypertensive heart disease without heart failure: Secondary | ICD-10-CM

## 2018-10-27 ENCOUNTER — Ambulatory Visit: Payer: HMO | Admitting: Family Medicine

## 2018-11-03 ENCOUNTER — Other Ambulatory Visit: Payer: Self-pay | Admitting: Family Medicine

## 2018-11-06 ENCOUNTER — Ambulatory Visit (INDEPENDENT_AMBULATORY_CARE_PROVIDER_SITE_OTHER): Payer: HMO | Admitting: *Deleted

## 2018-11-06 DIAGNOSIS — I639 Cerebral infarction, unspecified: Secondary | ICD-10-CM

## 2018-11-07 LAB — CUP PACEART REMOTE DEVICE CHECK
Date Time Interrogation Session: 20201026193814
Implantable Pulse Generator Implant Date: 20181009

## 2018-11-08 ENCOUNTER — Other Ambulatory Visit: Payer: Self-pay | Admitting: Family Medicine

## 2018-11-13 ENCOUNTER — Other Ambulatory Visit: Payer: Self-pay | Admitting: Family Medicine

## 2018-11-13 ENCOUNTER — Other Ambulatory Visit: Payer: Self-pay

## 2018-11-13 DIAGNOSIS — E1121 Type 2 diabetes mellitus with diabetic nephropathy: Secondary | ICD-10-CM

## 2018-11-13 DIAGNOSIS — I639 Cerebral infarction, unspecified: Secondary | ICD-10-CM

## 2018-11-13 MED ORDER — BD PEN NEEDLE NANO U/F 32G X 4 MM MISC: 2 refills | Status: DC

## 2018-11-13 MED ORDER — ATORVASTATIN CALCIUM 80 MG PO TABS: 80.0000 mg | ORAL_TABLET | Freq: Every day | ORAL | 0 refills | Status: DC

## 2018-11-24 NOTE — Progress Notes (Signed)
[facility] Summary Report / Loop Recorder 

## 2018-11-27 ENCOUNTER — Encounter: Payer: Self-pay | Admitting: Family Medicine

## 2018-11-27 ENCOUNTER — Other Ambulatory Visit: Payer: Self-pay

## 2018-11-27 ENCOUNTER — Ambulatory Visit (INDEPENDENT_AMBULATORY_CARE_PROVIDER_SITE_OTHER): Payer: HMO | Admitting: Family Medicine

## 2018-11-27 VITALS — BP 140/76 | HR 91 | Temp 97.5°F | Resp 16 | Ht 60.0 in | Wt 143.6 lb

## 2018-11-27 DIAGNOSIS — I119 Hypertensive heart disease without heart failure: Secondary | ICD-10-CM | POA: Diagnosis not present

## 2018-11-27 DIAGNOSIS — M170 Bilateral primary osteoarthritis of knee: Secondary | ICD-10-CM | POA: Insufficient documentation

## 2018-11-27 DIAGNOSIS — E1121 Type 2 diabetes mellitus with diabetic nephropathy: Secondary | ICD-10-CM | POA: Diagnosis not present

## 2018-11-27 DIAGNOSIS — N182 Chronic kidney disease, stage 2 (mild): Secondary | ICD-10-CM | POA: Insufficient documentation

## 2018-11-27 LAB — BASIC METABOLIC PANEL
BUN: 10 mg/dL (ref 6–23)
CO2: 27 mEq/L (ref 19–32)
Calcium: 9.4 mg/dL (ref 8.4–10.5)
Chloride: 108 mEq/L (ref 96–112)
Creatinine, Ser: 0.5 mg/dL (ref 0.40–1.20)
GFR: 147.03 mL/min (ref >60.00)
Glucose, Bld: 134 mg/dL — ABNORMAL HIGH (ref 70–99)
Potassium: 3.7 mEq/L (ref 3.5–5.1)
Sodium: 142 mEq/L (ref 135–145)

## 2018-11-27 LAB — MICROALBUMIN / CREATININE URINE RATIO
Creatinine,U: 108 mg/dL
Microalb Creat Ratio: 302.5 mg/g — ABNORMAL HIGH (ref 0.0–30.0)
Microalb, Ur: 326.6 mg/dL — ABNORMAL HIGH (ref 0.0–1.9)

## 2018-11-27 LAB — POCT GLYCOSYLATED HEMOGLOBIN (HGB A1C): Hemoglobin A1C: 9.7 % — AB (ref 4.0–5.6)

## 2018-11-27 MED ORDER — METFORMIN HCL 500 MG PO TABS: 500.0000 mg | ORAL_TABLET | Freq: Two times a day (BID) | ORAL | 1 refills | Status: DC

## 2018-11-27 MED ORDER — DICLOFENAC SODIUM 1 % EX GEL: 2.0000 g | Freq: Four times a day (QID) | CUTANEOUS | 3 refills | Status: DC

## 2018-11-27 MED ORDER — VICTOZA 18 MG/3ML ~~LOC~~ SOPN: 1.8000 mg | PEN_INJECTOR | Freq: Every day | SUBCUTANEOUS | 3 refills | Status: DC

## 2018-11-27 MED ORDER — LANTUS SOLOSTAR 100 UNIT/ML ~~LOC~~ SOPN: 30.0000 [IU] | PEN_INJECTOR | Freq: Two times a day (BID) | SUBCUTANEOUS | 3 refills | Status: DC

## 2018-11-27 NOTE — Patient Instructions (Signed)
A few things to remember from today's visit:   Type II diabetes mellitus with nephropathy ([place]) - Plan: Basic Metabolic Panel, Microalbumin / creatinine urine ratio, Insulin Glargine (LANTUS SOLOSTAR) 100 UNIT/ML Solostar Pen  Hypertension with heart disease - Plan: Basic Metabolic Panel  Osteoarthritis of both knees, unspecified osteoarthritis type  Lantus increased from 50 units daily to 30 units twice daily. No changes in Metformin or Victoza.  Please be sure medication list is accurate. If a new problem present, please set up appointment sooner than planned today.

## 2018-11-27 NOTE — Assessment & Plan Note (Signed)
Otherwise adequately controlled. No changes in current management. Continue monitoring BP at home. Low-salt diet also recommended. Eye exam is current.

## 2018-11-27 NOTE — Assessment & Plan Note (Signed)
HgA1C is not at goal. Lantus increased from 50 units daily to 30 units twice daily. No changes in Victoza or Metformin.  Regular exercise and healthy diet with avoidance of added sugar food intake is an important part of treatment and recommended. Annual eye exam, periodic dental and foot care recommended. F/U in 4-5 months

## 2018-11-27 NOTE — Assessment & Plan Note (Signed)
Adequate BP and glucose control. Continue low-salt diet. Avoid oral NSAIDs. Adequate hydration.

## 2018-11-27 NOTE — Progress Notes (Signed)
HPI:  Chief Complaint  Patient presents with  . Follow-up    Sarah [name] is a 73 y.o. female, who is here today for chronic disease management. Last seen on 06/26/18,virtual visit. Seen on 08/08/18 because elevated glucose before cataract surgery.  Hypertension: Home BPs 130s/80s. Currently she is on amlodipine 5 mg daily and losartan 50 mg daily. + CVA, no significant focal deficit, except for left upper extremity fine motor function. According to patient, she has appointment with neurologist in 12/2018. Currently she is on Plavix 75 mg daily and atorvastatin 80 mg daily.  Lab Results  Component Value Date   CREATININE 0.52 06/26/2018   BUN 11 06/26/2018   NA 139 06/26/2018   K 3.9 06/26/2018   CL 105 06/26/2018   CO2 25 06/26/2018   Denies severe/frequent headache, visual changes, chest pain, dyspnea, palpitation, claudication, new focal weakness, or edema.  DM2: BS < 200's. Currently she is on Victoza1.8 milligrams daily, Metformin 500 mg twice daily, and Lantus 50 U daily. High doses of Metformin have caused diarrhea.  Denies abdominal pain, nausea,vomiting, polydipsia,polyuria, or polyphagia.  Lab Results  Component Value Date   HGBA1C 10.3 (H) 06/26/2018   She has not noted gross hematuria, foamy urine, or decreased urine output.  Lab Results  Component Value Date   MICROALBUR 100.6 (H) 12/19/2017   MICROALBUR 112.5 (H) 07/29/2017   Bilateral, mild knee pain. Problem has been intermittent for a while. Usually worse with weather changes.  Exacerbated by prolonged walking, standing, going up and down stairs. She has not tried OTC medications. No erythema or edema.   Review of Systems  Constitutional: Negative for activity change, appetite change,unusual fatigue and fever.  HENT: Negative for mouth sores, nosebleeds and trouble swallowing.   Eyes: Negative for redness and eye pain. Respiratory: Negative for cough, shortness of breath  and wheezing.   Gastrointestinal: Negative for changes in bowel habits.  Endocrine: Negative.   Genitourinary: Negative for difficulty urinating and dysuria.  Musculoskeletal: Negative for gait problem and myalgias.  Skin: Negative for color change and rash.    Neurological: Negative for syncope, new focal weakness,and facial symmetry.  Psychiatric/Behavioral: Negative for confusion,depression,and anxiety. Rest of ROS see pertinent positives and negatives in HPI.   Current Outpatient Medications on File Prior to Visit  Medication Sig Dispense Refill  . acetaminophen (TYLENOL) 500 MG tablet Take 1,000 mg by mouth every 6 (six) hours as needed for mild pain or headache.    Marland Kitchen amLODipine (NORVASC) 5 MG tablet TAKE 1 AND 1/2 TABLETS(7.5 MG) BY MOUTH DAILY 135 tablet 1  . atorvastatin (LIPITOR) 80 MG tablet Take 1 tablet (80 mg total) by mouth daily at 6 PM. 30 tablet 0  . Blood Glucose Calibration (OT ULTRA/FASTTK CNTRL SOLN) SOLN     . calcium carbonate (TUMS EX) 750 MG chewable tablet Chew 1 tablet by mouth as needed for heartburn.    . clopidogrel (PLAVIX) 75 MG tablet TAKE 1 TABLET BY MOUTH DAILY 90 tablet 2  . famotidine (PEPCID) 40 MG tablet TAKE 1 TABLET(40 MG) BY MOUTH AT BEDTIME 90 tablet 3  . Insulin Pen Needle (BD PEN NEEDLE NANO U/F) 32G X 4 MM MISC USE UP TO FOUR TIMES DAILY 200 each 2  . Lancet Devices (SIMPLE DIAGNOSTICS LANCING DEV) MISC Check BS before and 2 hours after meals. 200 each 6  . losartan (COZAAR) 50 MG tablet TAKE 1 TABLET(50 MG) BY MOUTH DAILY 90 tablet 1  .  omeprazole (PRILOSEC) 20 MG capsule Take 1 capsule (20 mg total) by mouth daily. 30 capsule 3  . ONE TOUCH ULTRA TEST test strip Check BS before and 2 hours after meals. 200 each 6  . PHARMACIST CHOICE ALCOHOL 70 % PADS     . ROCKLATAN 0.02-0.005 % SOLN Place 1 drop into both eyes every evening.    . TRAVATAN Z 0.004 % SOLN ophthalmic solution Place 1 drop into both eyes at bedtime.  11  . Vitamin D,  Ergocalciferol, (DRISDOL) 1.25 MG (50000 UT) CAPS capsule TAKE ONE CAPSULE BY MOUTH TWICE A WEEK ON MONDAY AND THURSDAY 8 capsule 0   No current facility-administered medications on file prior to visit.     Past Medical History:  Diagnosis Date  . Boils   . Glaucoma   . Heart murmur   . HTN (hypertension)   . Hx of adenomatous colonic polyps   . Hyperlipidemia   . Osteoporosis   . Pneumonia   . Stroke ([place])   . Type II or unspecified type diabetes mellitus without mention of complication, uncontrolled     No Known Allergies  Social History   Socioeconomic History  . Marital status: Single    Spouse name: Not on file  . Number of children: Not on file  . Years of education: Not on file  . Highest education level: Not on file  Occupational History  . Not on file  Social Needs  . Financial resource strain: Not on file  . Food insecurity    Worry: Not on file    Inability: Not on file  . Transportation needs    Medical: Not on file    Non-medical: Not on file  Tobacco Use  . Smoking status: Former Smoker    Types: Cigarettes    Quit date: 10/15/2016    Years since quitting: 2.1  . Smokeless tobacco: Never Used  . Tobacco comment: smokes occ.   Substance and Sexual Activity  . Alcohol use: Yes    Alcohol/week: 0.0 standard drinks    Comment: occ  . Drug use: No  . Sexual activity: Not on file  Lifestyle  . Physical activity    Days per week: Not on file    Minutes per session: Not on file  . Stress: Not on file  Relationships  . Social Herbalist on phone: Not on file    Gets together: Not on file    Attends religious service: Not on file    Active member of club or organization: Not on file    Attends meetings of clubs or organizations: Not on file    Relationship status: Not on file  Other Topics Concern  . Not on file  Social History Narrative  . Not on file    Today's Vitals   11/27/18 1128  BP: 140/76  Pulse: 91  Resp: 16  Temp: (!)  97.5 F (36.4 C)  TempSrc: Temporal  SpO2: 96%  Weight: 143 lb 9.6 oz (65.1 kg)  Height: 5' (1.524 m)    Body mass index is 28.04 kg/m.   Physical Exam  Nursing note and vitals reviewed. Constitutional: She is oriented to person, place, and time. She appears well-developed. No distress.  HENT:  Head: Normocephalic and atraumatic.  Mouth/Throat: Oropharynx is clear and moist and mucous membranes are normal.  Eyes: Pupils are equal, round, and reactive to light. Conjunctivae are normal.  Cardiovascular: Normal rate and regular rhythm.  No murmur  heard. Pulses:      Dorsalis pedis pulses are 2+ on the right side, and 2+ on the left side.  Respiratory: Effort normal and breath sounds normal. No respiratory distress.  GI: Soft. She exhibits no mass. There is no hepatomegaly. There is no tenderness.  Musculoskeletal: She exhibits no edema. Knee crepitus bilateral.No pain elicited,minimal limitation of flexion,no edema or erythema. Lymphadenopathy:    She has no cervical adenopathy.  Neurological: She is alert and oriented to person, place, and time. She has normal strength. No cranial nerve deficit. Gait stable,not assisted.  Skin: Skin is warm. No rash noted. No erythema.  Psychiatric: She has a normal mood and affect.  Fairly groomed, good eye contact.    ASSESSMENT AND PLAN:  Ms. Milia was seen today for follow-up.  Diagnoses and all orders for this visit: Orders Placed This Encounter  Procedures  . Basic Metabolic Panel  . Microalbumin / creatinine urine ratio  . POC HgB A1c   Lab Results  Component Value Date   HGBA1C 9.7 (A) 11/27/2018   Lab Results  Component Value Date   CREATININE 0.50 11/27/2018   BUN 10 11/27/2018   NA 142 11/27/2018   K 3.7 11/27/2018   CL 108 11/27/2018   CO2 27 11/27/2018   Lab Results  Component Value Date   MICROALBUR 326.6 (H) 11/27/2018   MICROALBUR 100.6 (H) 12/19/2017    Type II diabetes mellitus with nephropathy ([place])  HgA1C is not at goal. Lantus increased from 50 units daily to 30 units twice daily. No changes in Victoza or Metformin.  Regular exercise and healthy diet with avoidance of added sugar food intake is an important part of treatment and recommended. Annual eye exam, periodic dental and foot care recommended. F/U in 4-5 months   Hypertension with heart disease Otherwise adequately controlled. No changes in current management. Continue monitoring BP at home. Low-salt diet also recommended. Eye exam is current.  Osteoarthritis of both knees Avoid activities that could cause pain. Topical diclofenac 3-4 times per day recommended. She can also take acetaminophen 500 mg 3-4 times per day as needed.  CKD (chronic kidney disease), stage II Adequate BP and glucose control. Continue low-salt diet. Avoid oral NSAIDs. Adequate hydration.   Return in about 5 months (around 04/27/2019).   Kaily Wragg [name], MD [facility]. [place] office.

## 2018-11-27 NOTE — Assessment & Plan Note (Signed)
Avoid activities that could cause pain. Topical diclofenac 3-4 times per day recommended. She can also take acetaminophen 500 mg 3-4 times per day as needed.

## 2018-11-30 ENCOUNTER — Encounter: Payer: Self-pay | Admitting: Family Medicine

## 2018-12-10 LAB — CUP PACEART REMOTE DEVICE CHECK
Date Time Interrogation Session: 20201128161927
Implantable Pulse Generator Implant Date: 20181009

## 2018-12-11 ENCOUNTER — Ambulatory Visit (INDEPENDENT_AMBULATORY_CARE_PROVIDER_SITE_OTHER): Payer: HMO | Admitting: *Deleted

## 2018-12-11 DIAGNOSIS — I63411 Cerebral infarction due to embolism of right middle cerebral artery: Secondary | ICD-10-CM

## 2018-12-22 ENCOUNTER — Other Ambulatory Visit: Payer: Self-pay

## 2018-12-22 DIAGNOSIS — I639 Cerebral infarction, unspecified: Secondary | ICD-10-CM

## 2018-12-22 MED ORDER — ATORVASTATIN CALCIUM 80 MG PO TABS: 80.0000 mg | ORAL_TABLET | Freq: Every day | ORAL | 2 refills | Status: DC

## 2018-12-25 ENCOUNTER — Other Ambulatory Visit: Payer: Self-pay

## 2018-12-25 NOTE — Patient Outreach (Signed)
  [facility]) Care Management Chronic Special Needs Program  12/25/2018  Name: CAMILLE ANTAR DOB: 10/19/1945  MRN: [id number]  Ms. Lyndsea Hinebaugh is enrolled in a chronic special needs plan for Diabetes. Reviewed and updated care plan.  Subjective: client reports she has attended provider visits as scheduled. She states last A1C 9.7 on 11/27/2018. Client reports she did not check blood sugar this morning. She states her A1C has improved due to medication adjustment, and exercise, she reports she is paying more attending to self management of her diabetes. Client denies any issues or concerns.  Goals Addressed            This Visit's Progress   . COMPLETED: Client understands the importance of follow-up with providers by attending scheduled visits       Voiced importance of attending provider visits.    . COMPLETED: Client will report no fall or injuries in the next 9 months.       Denies any falls.    . Client will verbalize knowledge of self management of Hypertension as evidences by BP reading of 140/90 or less; or as defined by provider   On track   . COMPLETED: Maintain timely refills of diabetic medication as prescribed within the year .       Denies any difficulty obtaining medications.    . COMPLETED: Obtain annual  Lipid Profile, LDL-C       Done 02/09/2018    . COMPLETED: Obtain Annual Eye (retinal)  Exam        Done 08/21/2018    . COMPLETED: Obtain Annual Foot Exam       Per client done by primary care provider.    . COMPLETED: Obtain annual screen for micro albuminuria (urine) , nephropathy (kidney problems)       Done 11/27/2018    . COMPLETED: Obtain Hemoglobin A1C at least 2 times per year       A1C done at least two times this year.    . COMPLETED: Visit Primary Care Provider or Endocrinologist at least 2 times per year        Has seen provider twice within the last year.      covid 19 precautions discussed. RNCM encouraged  client to call 24 hour nurse advice line as needed. RNCM reiterated health care concierge is available for benefits questions. RNCM encouraged client to call RNCM as needed.  Plan: RNCM will send updated care plan to client; send updated care plan to primary care. RNCM will outreach to client per tier level within the next 9-12 month or sooner as indicated.     Thea Silversmith, RN, MSN, [place] [phone]   .

## 2018-12-27 ENCOUNTER — Ambulatory Visit: Payer: HMO | Admitting: Adult Health

## 2018-12-27 NOTE — Progress Notes (Deleted)
[facility]  PATIENT: Sarah [name] DOB: 12-18-1945    REASON FOR VISIT: Follow-up for stroke right MCA 10/2016 readmitted 02/08/2018 for left hemianopsia here for follow-up HISTORY FROM: Patient  No chief complaint on file.     HISTORY OF PRESENT ILLNESS:  Update 12/27/2018: Sarah [name] is a 73 year old female who is being seen today for stroke follow-up.  She has been stable from a stroke standpoint with residual ***.  Continues on Plavix and atorvastatin for secondary stroke prevention without side effects.  Blood pressure today ***.  Continues to follow with PCP for DM management with recent A1c 9.7.  Loop recorder is not shown atrial fibrillation thus far.  Denies new or worsening stroke/TIA symptoms.  06/27/2018 VIRTUAL VISIT: Sarah [name] is being seen today for three-month follow-up visit regarding recent stroke.  Initially scheduled for office visit follow-up but due to COVID-19 safety precautions, visit transition to telemedicine via MyChart with patient's consent.  She has been stable from a stroke standpoint with residual left homonymous hemianopsia and balance difficulties.  She continues to participate in outpatient PT with ongoing improvement.  Continues on Plavix and atorvastatin for secondary stroke prevention without reported side effects. Blood pressure monitored at home.  Lab work obtained yesterday with A1c 10.3 (prior 13.5).  She continues to follow with PCP for ongoing management.  Loop recorder has not shown atrial fibrillation thus far.  No further concerns at this time.  Denies new or worsening stroke/TIA symptoms.    Hospital follow-up 03/23/2018 CM: Sarah [name], 73 year old female returns for follow-up with readmission for stroke 1/29/ 2020.  MRI of the brain acute cortical and subcortical RPO infarcts.  She initially presented with left hemianopsia.  She is now on Plavix and aspirin for 3 months and then Plavix alone.  She has not had  further stroke or TIA symptoms.  She has minimal bruising and no bleeding.  Hemoglobin A1c 13.5 on hospital admission.  She claims she is trying to get that down to a more reasonable level.  She remains on Lipitor without myalgias.  Blood pressure in the office today 146/87.  She continues to go to [facility].  She is to start rehab physical therapy and occupational therapy today.  Loop recorder so far no atrial fibrillation.  She is not driving due to her vision.  She returns for reevaluation   HOSPITAL SUMMARY: right parieto-occipital infarcts in setting of progressive R MCA stenosis, infarcts secondary to large vessel disease source  Resultant L hemianopia   CT head R parietal watershed infarct. Small vessel disease.   MRI  Acute cortical and subcortical R P-O infarcts. Old R CR and R frontal lobe infarcts. Small vessel disease.   MRA head  No LVO. Focal stenosis R M1, R M2/M3, L M1  MRA neck B VA origin atherosclerosis 30-50%  2D Echo  pending   Loop interrogation - last check 1/3 w/o AF - interrogation today without AF  LDL 76  HgbA1c 13.5  Lovenox 40 mg sq daily for VTE prophylaxis  clopidogrel 75 mg daily prior to admission, now on aspirin 325 mg daily and clopidogrel 75 mg daily. Continue DAPT x 3 months then plavix alone  Therapy recommendations:  OP OT and PT  Disposition:  Home  Patient advised not to drive          REVIEW OF SYSTEMS: Full 14 system review of systems performed and notable only for those listed, all others are neg:  Visual loss  and balance deficits  ALLERGIES: No Known Allergies  HOME MEDICATIONS:   PAST MEDICAL HISTORY: Past Medical History:  Diagnosis Date  . Boils   . Glaucoma   . Heart murmur   . HTN (hypertension)   . Hx of adenomatous colonic polyps   . Hyperlipidemia   . Osteoporosis   . Pneumonia   . Stroke ([place])   . Type II or unspecified type diabetes mellitus without mention of complication, uncontrolled      PAST SURGICAL HISTORY: Past Surgical History:  Procedure Laterality Date  . ABDOMINAL HYSTERECTOMY    . CARPAL TUNNEL RELEASE     right  . COLONOSCOPY  12-10-10   per Dr. Deatra Ina, clear, repeat in 7 yrs   . INCISION AND DRAINAGE PERIRECTAL ABSCESS N/A 09/26/2015   Procedure: IRRIGATION AND DEBRIDEMENT PERIRECTAL ABSCESS;  Surgeon: Mickeal Skinner, MD;  Location: [facility];  Service: General;  Laterality: N/A;  . KNEE ARTHROSCOPY     right  . LOOP RECORDER INSERTION N/A 10/19/2016   Procedure: LOOP RECORDER INSERTION;  Surgeon: Thompson Grayer, MD;  Location: [facility];  Service: Cardiovascular;  Laterality: N/A;  . ORIF ANKLE FRACTURE Right 03/24/2012   Procedure: OPEN REDUCTION INTERNAL FIXATION (ORIF) ANKLE FRACTURE;  Surgeon: Newt Minion, MD;  Location: [employer];  Service: Orthopedics;  Laterality: Right;  Open Reduction Internal Fixation Right Bimalleolar ankle fracture  . POLYPECTOMY    . TEE WITHOUT CARDIOVERSION N/A 10/18/2016   Procedure: TRANSESOPHAGEAL ECHOCARDIOGRAM (TEE);  Surgeon: Fay Records, MD;  [facility];  Service: Cardiovascular;  Laterality: N/A;  . TONSILLECTOMY      FAMILY HISTORY: Family History  Problem Relation Age of Onset  . Diabetes Mother   . Hypertension Mother   . Stroke Father   . Stroke Maternal Uncle   . Stroke Paternal Uncle   . Colon cancer Neg Hx   . Esophageal cancer Neg Hx   . Rectal cancer Neg Hx   . Stomach cancer Neg Hx     SOCIAL HISTORY: Social History   Socioeconomic History  . Marital status: Single    Spouse name: Not on file  . Number of children: Not on file  . Years of education: Not on file  . Highest education level: Not on file  Occupational History  . Not on file  Tobacco Use  . Smoking status: Former Smoker    Types: Cigarettes    Quit date: 10/15/2016    Years since quitting: 2.2  . Smokeless tobacco: Never Used  . Tobacco comment: smokes occ.   Substance and Sexual Activity  . Alcohol  use: Yes    Alcohol/week: 0.0 standard drinks    Comment: occ  . Drug use: No  . Sexual activity: Not on file  Other Topics Concern  . Not on file  Social History Narrative  . Not on file   Social Determinants of Health   Financial Resource Strain:   . Difficulty of Paying Living Expenses: Not on file  Food Insecurity:   . Worried About Charity fundraiser in the Last Year: Not on file  . Ran Out of Food in the Last Year: Not on file  Transportation Needs:   . Lack of Transportation (Medical): Not on file  . Lack of Transportation (Non-Medical): Not on file  Physical Activity:   . Days of Exercise per Week: Not on file  . Minutes of Exercise per Session: Not on file  Stress:   .  Feeling of Stress : Not on file  Social Connections:   . Frequency of Communication with Friends and Family: Not on file  . Frequency of Social Gatherings with Friends and Family: Not on file  . Attends Religious Services: Not on file  . Active Member of Clubs or Organizations: Not on file  . Attends Archivist Meetings: Not on file  . Marital Status: Not on file  Intimate Partner Violence:   . Fear of Current or Ex-Partner: Not on file  . Emotionally Abused: Not on file  . Physically Abused: Not on file  . Sexually Abused: Not on file     PHYSICAL EXAM  General: well developed, well nourished, pleasant elderly AA female, seated, in no evident distress Head: head normocephalic and atraumatic.    Neurologic Exam Mental Status: Awake and fully alert. Oriented to place and time. Subjective vision deficits. Recent and remote memory intact. Attention span, concentration and fund of knowledge appropriate. Mood and affect appropriate.  Cranial Nerves: Extraocular movements full without nystagmus. Hearing intact to voice. Facial sensation intact. Face, tongue, palate moves normally and symmetrically.  Shoulder shrug symmetric. Motor: No evidence of weakness per drift assessment Sensory.:  intact to light touch Coordination: Rapid alternating movements normal in all extremities. Finger-to-nose and heel-to-shin performed accurately bilaterally. Gait and Station: Arises from chair without difficulty. Stance is normal. Gait demonstrates normal stride length and balance .  Reflexes: UTA  DIAGNOSTIC DATA (LABS, IMAGING, TESTING) - I reviewed patient records, labs, notes, testing and imaging myself where available.  Lab Results  Component Value Date   WBC 6.3 02/14/2018   HGB 11.7 (L) 02/14/2018   HCT 34.8 (L) 02/14/2018   MCV 92.8 02/14/2018   PLT 377.0 02/14/2018      Component Value Date/Time   NA 142 11/27/2018 1158   K 3.7 11/27/2018 1158   CL 108 11/27/2018 1158   CO2 27 11/27/2018 1158   GLUCOSE 134 (H) 11/27/2018 1158   BUN 10 11/27/2018 1158   CREATININE 0.50 11/27/2018 1158   CALCIUM 9.4 11/27/2018 1158   PROT 6.5 02/08/2018 1143   ALBUMIN 2.8 (L) 02/08/2018 1143   AST 14 (L) 02/08/2018 1143   ALT 10 02/08/2018 1143   ALKPHOS 74 02/08/2018 1143   BILITOT 0.4 02/08/2018 1143   GFRNONAA >60 02/08/2018 1143   GFRAA >60 02/08/2018 1143   Lab Results  Component Value Date   CHOL 152 02/09/2018   HDL 49 02/09/2018   LDLCALC 76 02/09/2018   TRIG 134 02/09/2018   CHOLHDL 3.1 02/09/2018   Lab Results  Component Value Date   HGBA1C 9.7 (A) 11/27/2018        ASSESSMENT AND PLAN    Sarah [name] is a 73 y.o. female with history of hypertension, hyperlipidemia, prior right MCA stroke with no gross deficits 2018 but very minimal left hand fine motor deficits, diabetes presenting to the hospital 02/08/2018 with left homonymous hemianopsia, MRI cortical and subcortical RPO infarcts.  Residual deficits of left homonymous hemianopia and gait difficulties.     Plan:  -continue plavix and continue lipitor for stroke prevention -continue to follow with PCP for HTN, HLD and DM management -monitor loop recorder for atrial fibrillation -Continue to  monitor blood pressure at home -Continue stay active and maintain a healthy diet - maintain blood pressure goal <130/80, diabetes with hemoglobin A1c goal below 7.0% and lipids with LDL cholesterol goal below 70 mg/dL.   Follow-up in 6 months or call  earlier if needed  I spent 15 minutes in total non-face to face time with the patient more than 50% of which was spent counseling and coordination of care, reviewing test results reviewing medications and discussing and reviewing the diagnosis of stroke and management of risk factors and further treatment options.  No driving until further notice  Venancio Poisson, AGNP-BC  [facility] [address] [place], [place] [address]  Phone [phone] Fax [phone] Note: This document was prepared with digital dictation and possible smart phrase technology. Any transcriptional errors that result from this process are unintentional.

## 2019-01-01 ENCOUNTER — Encounter: Payer: Self-pay | Admitting: Adult Health

## 2019-01-02 ENCOUNTER — Other Ambulatory Visit: Payer: Self-pay | Admitting: Family Medicine

## 2019-01-03 NOTE — Progress Notes (Signed)
ILR remote 

## 2019-01-10 ENCOUNTER — Other Ambulatory Visit: Payer: Self-pay

## 2019-01-10 MED ORDER — VITAMIN D (ERGOCALCIFEROL) 1.25 MG (50000 UNIT) PO CAPS: ORAL_CAPSULE | ORAL | 0 refills | Status: DC

## 2019-01-11 ENCOUNTER — Ambulatory Visit (INDEPENDENT_AMBULATORY_CARE_PROVIDER_SITE_OTHER): Payer: HMO | Admitting: *Deleted

## 2019-01-11 DIAGNOSIS — I63411 Cerebral infarction due to embolism of right middle cerebral artery: Secondary | ICD-10-CM | POA: Diagnosis not present

## 2019-01-12 LAB — CUP PACEART REMOTE DEVICE CHECK
Date Time Interrogation Session: 20201231175133
Implantable Pulse Generator Implant Date: 20181009

## 2019-01-15 NOTE — Progress Notes (Deleted)
[facility]  PATIENT: Sarah [name] DOB: 01/21/1945    REASON FOR VISIT: Follow-up for stroke right MCA 10/2016 readmitted 02/08/2018 for left hemianopsia here for follow-up HISTORY FROM: Patient  No chief complaint on file.     HISTORY OF PRESENT ILLNESS:  Update 01/15/2019: Sarah [name] is a 74 year old female who is being seen today for stroke follow-up.  She has been stable from a stroke standpoint with residual ***.  Continues on Plavix and atorvastatin for secondary stroke prevention without side effects.  Blood pressure today ***.  Continues to follow with PCP for DM management with recent A1c 9.7.  Loop recorder is not shown atrial fibrillation thus far.  Denies new or worsening stroke/TIA symptoms.  06/27/2018 VIRTUAL VISIT: Sarah [name] is being seen today for three-month follow-up visit regarding recent stroke.  Initially scheduled for office visit follow-up but due to COVID-19 safety precautions, visit transition to telemedicine via MyChart with patient's consent.  She has been stable from a stroke standpoint with residual left homonymous hemianopsia and balance difficulties.  She continues to participate in outpatient PT with ongoing improvement.  Continues on Plavix and atorvastatin for secondary stroke prevention without reported side effects. Blood pressure monitored at home.  Lab work obtained yesterday with A1c 10.3 (prior 13.5).  She continues to follow with PCP for ongoing management.  Loop recorder has not shown atrial fibrillation thus far.  No further concerns at this time.  Denies new or worsening stroke/TIA symptoms.    Hospital follow-up 03/23/2018 CM: Sarah [name], 74 year old female returns for follow-up with readmission for stroke 1/29/ 2020.  MRI of the brain acute cortical and subcortical RPO infarcts.  She initially presented with left hemianopsia.  She is now on Plavix and aspirin for 3 months and then Plavix alone.  She has not had  further stroke or TIA symptoms.  She has minimal bruising and no bleeding.  Hemoglobin A1c 13.5 on hospital admission.  She claims she is trying to get that down to a more reasonable level.  She remains on Lipitor without myalgias.  Blood pressure in the office today 146/87.  She continues to go to [facility].  She is to start rehab physical therapy and occupational therapy today.  Loop recorder so far no atrial fibrillation.  She is not driving due to her vision.  She returns for reevaluation   HOSPITAL SUMMARY: right parieto-occipital infarcts in setting of progressive R MCA stenosis, infarcts secondary to large vessel disease source  Resultant L hemianopia   CT head R parietal watershed infarct. Small vessel disease.   MRI  Acute cortical and subcortical R P-O infarcts. Old R CR and R frontal lobe infarcts. Small vessel disease.   MRA head  No LVO. Focal stenosis R M1, R M2/M3, L M1  MRA neck B VA origin atherosclerosis 30-50%  2D Echo  pending   Loop interrogation - last check 1/3 w/o AF - interrogation today without AF  LDL 76  HgbA1c 13.5  Lovenox 40 mg sq daily for VTE prophylaxis  clopidogrel 75 mg daily prior to admission, now on aspirin 325 mg daily and clopidogrel 75 mg daily. Continue DAPT x 3 months then plavix alone  Therapy recommendations:  OP OT and PT  Disposition:  Home  Patient advised not to drive          REVIEW OF SYSTEMS: Full 14 system review of systems performed and notable only for those listed, all others are neg:  Visual loss  and balance deficits  ALLERGIES: No Known Allergies  HOME MEDICATIONS:   PAST MEDICAL HISTORY: Past Medical History:  Diagnosis Date  . Boils   . Glaucoma   . Heart murmur   . HTN (hypertension)   . Hx of adenomatous colonic polyps   . Hyperlipidemia   . Osteoporosis   . Pneumonia   . Stroke ([place])   . Type II or unspecified type diabetes mellitus without mention of complication, uncontrolled      PAST SURGICAL HISTORY: Past Surgical History:  Procedure Laterality Date  . ABDOMINAL HYSTERECTOMY    . CARPAL TUNNEL RELEASE     right  . COLONOSCOPY  12-10-10   per Dr. Deatra Ina, clear, repeat in 7 yrs   . INCISION AND DRAINAGE PERIRECTAL ABSCESS N/A 09/26/2015   Procedure: IRRIGATION AND DEBRIDEMENT PERIRECTAL ABSCESS;  Surgeon: Mickeal Skinner, MD;  Location: [place];  Service: General;  Laterality: N/A;  . KNEE ARTHROSCOPY     right  . LOOP RECORDER INSERTION N/A 10/19/2016   Procedure: LOOP RECORDER INSERTION;  Surgeon: Thompson Grayer, MD;  Location: [place] [facility];  Service: Cardiovascular;  Laterality: N/A;  . ORIF ANKLE FRACTURE Right 03/24/2012   Procedure: OPEN REDUCTION INTERNAL FIXATION (ORIF) ANKLE FRACTURE;  Surgeon: Newt Minion, MD;  Location: [place];  Service: Orthopedics;  Laterality: Right;  Open Reduction Internal Fixation Right Bimalleolar ankle fracture  . POLYPECTOMY    . TEE WITHOUT CARDIOVERSION N/A 10/18/2016   Procedure: TRANSESOPHAGEAL ECHOCARDIOGRAM (TEE);  Surgeon: Fay Records, MD;  Location[facility] END[facility];  Service: Cardiovascular;  Laterality: N/A;  . TONSILLECTOMY      FAMILY HISTORY: Family History  Problem Relation Age of Onset  . Diabetes Mother   . Hypertension Mother   . Stroke Father   . Stroke Maternal Uncle   . Stroke Paternal Uncle   . Colon cancer Neg Hx   . Esophageal cancer Neg Hx   . Rectal cancer Neg Hx   . Stomach cancer Neg Hx     SOCIAL HISTORY: Social History   Socioeconomic History  . Marital status: Single    Spouse name: Not on file  . Number of children: Not on file  . Years of education: Not on file  . Highest education level: Not on file  Occupational History  . Not on file  Tobacco Use  . Smoking status: Former Smoker    Types: Cigarettes    Quit date: 10/15/2016    Years since quitting: 2.2  . Smokeless tobacco: Never Used  . Tobacco comment: smokes occ.   Substance and Sexual Activity  . Alcohol  use: Yes    Alcohol/week: 0.0 standard drinks    Comment: occ  . Drug use: No  . Sexual activity: Not on file  Other Topics Concern  . Not on file  Social History Narrative  . Not on file   Social Determinants of Health   Financial Resource Strain:   . Difficulty of Paying Living Expenses: Not on file  Food Insecurity:   . Worried About Charity fundraiser in the Last Year: Not on file  . Ran Out of Food in the Last Year: Not on file  Transportation Needs:   . Lack of Transportation (Medical): Not on file  . Lack of Transportation (Non-Medical): Not on file  Physical Activity:   . Days of Exercise per Week: Not on file  . Minutes of Exercise per Session: Not on file  Stress:   .  Feeling of Stress : Not on file  Social Connections:   . Frequency of Communication with Friends and Family: Not on file  . Frequency of Social Gatherings with Friends and Family: Not on file  . Attends Religious Services: Not on file  . Active Member of Clubs or Organizations: Not on file  . Attends Archivist Meetings: Not on file  . Marital Status: Not on file  Intimate Partner Violence:   . Fear of Current or Ex-Partner: Not on file  . Emotionally Abused: Not on file  . Physically Abused: Not on file  . Sexually Abused: Not on file     PHYSICAL EXAM  General: well developed, well nourished, pleasant elderly AA female, seated, in no evident distress Head: head normocephalic and atraumatic.    Neurologic Exam Mental Status: Awake and fully alert. Oriented to place and time. Subjective vision deficits. Recent and remote memory intact. Attention span, concentration and fund of knowledge appropriate. Mood and affect appropriate.  Cranial Nerves: Extraocular movements full without nystagmus. Hearing intact to voice. Facial sensation intact. Face, tongue, palate moves normally and symmetrically.  Shoulder shrug symmetric. Motor: No evidence of weakness per drift assessment Sensory.:  intact to light touch Coordination: Rapid alternating movements normal in all extremities. Finger-to-nose and heel-to-shin performed accurately bilaterally. Gait and Station: Arises from chair without difficulty. Stance is normal. Gait demonstrates normal stride length and balance .  Reflexes: UTA  DIAGNOSTIC DATA (LABS, IMAGING, TESTING) - I reviewed patient records, labs, notes, testing and imaging myself where available.  Lab Results  Component Value Date   WBC 6.3 02/14/2018   HGB 11.7 (L) 02/14/2018   HCT 34.8 (L) 02/14/2018   MCV 92.8 02/14/2018   PLT 377.0 02/14/2018      Component Value Date/Time   NA 142 11/27/2018 1158   K 3.7 11/27/2018 1158   CL 108 11/27/2018 1158   CO2 27 11/27/2018 1158   GLUCOSE 134 (H) 11/27/2018 1158   BUN 10 11/27/2018 1158   CREATININE 0.50 11/27/2018 1158   CALCIUM 9.4 11/27/2018 1158   PROT 6.5 02/08/2018 1143   ALBUMIN 2.8 (L) 02/08/2018 1143   AST 14 (L) 02/08/2018 1143   ALT 10 02/08/2018 1143   ALKPHOS 74 02/08/2018 1143   BILITOT 0.4 02/08/2018 1143   GFRNONAA >60 02/08/2018 1143   GFRAA >60 02/08/2018 1143   Lab Results  Component Value Date   CHOL 152 02/09/2018   HDL 49 02/09/2018   LDLCALC 76 02/09/2018   TRIG 134 02/09/2018   CHOLHDL 3.1 02/09/2018   Lab Results  Component Value Date   HGBA1C 9.7 (A) 11/27/2018        ASSESSMENT AND PLAN    Sarah [name] is a 74 y.o. female with history of hypertension, hyperlipidemia, prior right MCA stroke with no gross deficits 2018 but very minimal left hand fine motor deficits, diabetes presenting to the hospital 02/08/2018 with left homonymous hemianopsia, MRI cortical and subcortical RPO infarcts.  Residual deficits of left homonymous hemianopia and gait difficulties.     Plan:  -continue plavix and continue lipitor for stroke prevention -continue to follow with PCP for HTN, HLD and DM management -monitor loop recorder for atrial fibrillation -Continue to  monitor blood pressure at home -Continue stay active and maintain a healthy diet - maintain blood pressure goal <130/80, diabetes with hemoglobin A1c goal below 7.0% and lipids with LDL cholesterol goal below 70 mg/dL.   Follow-up in 6 months or call  earlier if needed  I spent 15 minutes in total non-face to face time with the patient more than 50% of which was spent counseling and coordination of care, reviewing test results reviewing medications and discussing and reviewing the diagnosis of stroke and management of risk factors and further treatment options.  No driving until further notice  Frann Rider, [facility]  [facility] [address] [place], [place] [address]  Phone [phone] Fax [phone] Note: This document was prepared with digital dictation and possible smart phrase technology. Any transcriptional errors that result from this process are unintentional.

## 2019-01-16 ENCOUNTER — Telehealth: Payer: HMO | Admitting: Adult Health

## 2019-01-17 ENCOUNTER — Telehealth: Payer: Medicare Other | Admitting: Adult Health

## 2019-01-17 ENCOUNTER — Other Ambulatory Visit: Payer: Self-pay

## 2019-01-17 NOTE — Patient Outreach (Signed)
  [facility]) Care Management Chronic Special Needs Program    01/17/2019  Name: Sarah [name], DOB: 09-Oct-1945  MRN: [id number]   RNCM received notification that client has dis-enrolled from the [place] C-SNP plan, therefore RNCM will no longer be able to follow as client's C-SNP Chronic Care Management Coordinator.  Plan: Close case, send case closure letter to primary care provider.  Thea Silversmith, RN, MSN, [place] [phone]

## 2019-01-17 NOTE — Progress Notes (Deleted)
[facility]  PATIENT: Sarah [name] DOB: 1945/03/30    REASON FOR VISIT: Follow-up for stroke right MCA 10/2016 readmitted 02/08/2018 for left hemianopsia here for follow-up HISTORY FROM: Patient  No chief complaint on file.     HISTORY OF PRESENT ILLNESS:  Update 01/15/2019: Sarah [name] is a 74 year old female who is being seen today for stroke follow-up.  She has been stable from a stroke standpoint with residual ***.  Continues on Plavix and atorvastatin for secondary stroke prevention without side effects.  Blood pressure today ***.  Continues to follow with PCP for DM management with recent A1c 9.7.  Loop recorder is not shown atrial fibrillation thus far.  Denies new or worsening stroke/TIA symptoms.  06/27/2018 VIRTUAL VISIT: Sarah [name] is being seen today for three-month follow-up visit regarding recent stroke.  Initially scheduled for office visit follow-up but due to COVID-19 safety precautions, visit transition to telemedicine via MyChart with patient's consent.  She has been stable from a stroke standpoint with residual left homonymous hemianopsia and balance difficulties.  She continues to participate in outpatient PT with ongoing improvement.  Continues on Plavix and atorvastatin for secondary stroke prevention without reported side effects. Blood pressure monitored at home.  Lab work obtained yesterday with A1c 10.3 (prior 13.5).  She continues to follow with PCP for ongoing management.  Loop recorder has not shown atrial fibrillation thus far.  No further concerns at this time.  Denies new or worsening stroke/TIA symptoms.    Hospital follow-up 03/23/2018 CM: Sarah [name], 74 year old female returns for follow-up with readmission for stroke 1/29/ 2020.  MRI of the brain acute cortical and subcortical RPO infarcts.  She initially presented with left hemianopsia.  She is now on Plavix and aspirin for 3 months and then Plavix alone.  She has not had  further stroke or TIA symptoms.  She has minimal bruising and no bleeding.  Hemoglobin A1c 13.5 on hospital admission.  She claims she is trying to get that down to a more reasonable level.  She remains on Lipitor without myalgias.  Blood pressure in the office today 146/87.  She continues to go to [facility].  She is to start rehab physical therapy and occupational therapy today.  Loop recorder so far no atrial fibrillation.  She is not driving due to her vision.  She returns for reevaluation   HOSPITAL SUMMARY: right parieto-occipital infarcts in setting of progressive R MCA stenosis, infarcts secondary to large vessel disease source  Resultant L hemianopia   CT head R parietal watershed infarct. Small vessel disease.   MRI  Acute cortical and subcortical R P-O infarcts. Old R CR and R frontal lobe infarcts. Small vessel disease.   MRA head  No LVO. Focal stenosis R M1, R M2/M3, L M1  MRA neck B VA origin atherosclerosis 30-50%  2D Echo  pending   Loop interrogation - last check 1/3 w/o AF - interrogation today without AF  LDL 76  HgbA1c 13.5  Lovenox 40 mg sq daily for VTE prophylaxis  clopidogrel 75 mg daily prior to admission, now on aspirin 325 mg daily and clopidogrel 75 mg daily. Continue DAPT x 3 months then plavix alone  Therapy recommendations:  OP OT and PT  Disposition:  Home  Patient advised not to drive          REVIEW OF SYSTEMS: Full 14 system review of systems performed and notable only for those listed, all others are neg:  Visual loss  and balance deficits  ALLERGIES: No Known Allergies  HOME MEDICATIONS:   PAST MEDICAL HISTORY: Past Medical History:  Diagnosis Date  . Boils   . Glaucoma   . Heart murmur   . HTN (hypertension)   . Hx of adenomatous colonic polyps   . Hyperlipidemia   . Osteoporosis   . Pneumonia   . Stroke ([place])   . Type II or unspecified type diabetes mellitus without mention of complication, uncontrolled      PAST SURGICAL HISTORY: Past Surgical History:  Procedure Laterality Date  . ABDOMINAL HYSTERECTOMY    . CARPAL TUNNEL RELEASE     right  . COLONOSCOPY  12-10-10   per Dr. Deatra Ina, clear, repeat in 7 yrs   . INCISION AND DRAINAGE PERIRECTAL ABSCESS N/A 09/26/2015   Procedure: IRRIGATION AND DEBRIDEMENT PERIRECTAL ABSCESS;  Surgeon: Mickeal Skinner, MD;  Location: [place];  Service: General;  Laterality: N/A;  . KNEE ARTHROSCOPY     right  . LOOP RECORDER INSERTION N/A 10/19/2016   Procedure: LOOP RECORDER INSERTION;  Surgeon: Thompson Grayer, MD;  Location: [place] [facility];  Service: Cardiovascular;  Laterality: N/A;  . ORIF ANKLE FRACTURE Right 03/24/2012   Procedure: OPEN REDUCTION INTERNAL FIXATION (ORIF) ANKLE FRACTURE;  Surgeon: Newt Minion, MD;  Location: [place];  Service: Orthopedics;  Laterality: Right;  Open Reduction Internal Fixation Right Bimalleolar ankle fracture  . POLYPECTOMY    . TEE WITHOUT CARDIOVERSION N/A 10/18/2016   Procedure: TRANSESOPHAGEAL ECHOCARDIOGRAM (TEE);  Surgeon: Fay Records, MD;  Location: [facility] ENDOSCOPY;  Service: Cardiovascular;  Laterality: N/A;  . TONSILLECTOMY      FAMILY HISTORY: Family History  Problem Relation Age of Onset  . Diabetes Mother   . Hypertension Mother   . Stroke Father   . Stroke Maternal Uncle   . Stroke Paternal Uncle   . Colon cancer Neg Hx   . Esophageal cancer Neg Hx   . Rectal cancer Neg Hx   . Stomach cancer Neg Hx     SOCIAL HISTORY: Social History   Socioeconomic History  . Marital status: Single    Spouse name: Not on file  . Number of children: Not on file  . Years of education: Not on file  . Highest education level: Not on file  Occupational History  . Not on file  Tobacco Use  . Smoking status: Former Smoker    Types: Cigarettes    Quit date: 10/15/2016    Years since quitting: 2.2  . Smokeless tobacco: Never Used  . Tobacco comment: smokes occ.   Substance and Sexual Activity  . Alcohol  use: Yes    Alcohol/week: 0.0 standard drinks    Comment: occ  . Drug use: No  . Sexual activity: Not on file  Other Topics Concern  . Not on file  Social History Narrative  . Not on file   Social Determinants of Health   Financial Resource Strain:   . Difficulty of Paying Living Expenses: Not on file  Food Insecurity:   . Worried About Charity fundraiser in the Last Year: Not on file  . Ran Out of Food in the Last Year: Not on file  Transportation Needs:   . Lack of Transportation (Medical): Not on file  . Lack of Transportation (Non-Medical): Not on file  Physical Activity:   . Days of Exercise per Week: Not on file  . Minutes of Exercise per Session: Not on file  Stress:   .  Feeling of Stress : Not on file  Social Connections:   . Frequency of Communication with Friends and Family: Not on file  . Frequency of Social Gatherings with Friends and Family: Not on file  . Attends Religious Services: Not on file  . Active Member of Clubs or Organizations: Not on file  . Attends Archivist Meetings: Not on file  . Marital Status: Not on file  Intimate Partner Violence:   . Fear of Current or Ex-Partner: Not on file  . Emotionally Abused: Not on file  . Physically Abused: Not on file  . Sexually Abused: Not on file     PHYSICAL EXAM  General: well developed, well nourished, pleasant elderly AA female, seated, in no evident distress Head: head normocephalic and atraumatic.    Neurologic Exam Mental Status: Awake and fully alert. Oriented to place and time. Subjective vision deficits. Recent and remote memory intact. Attention span, concentration and fund of knowledge appropriate. Mood and affect appropriate.  Cranial Nerves: Extraocular movements full without nystagmus. Hearing intact to voice. Facial sensation intact. Face, tongue, palate moves normally and symmetrically.  Shoulder shrug symmetric. Motor: No evidence of weakness per drift assessment Sensory.:  intact to light touch Coordination: Rapid alternating movements normal in all extremities. Finger-to-nose and heel-to-shin performed accurately bilaterally. Gait and Station: Arises from chair without difficulty. Stance is normal. Gait demonstrates normal stride length and balance .  Reflexes: UTA  DIAGNOSTIC DATA (LABS, IMAGING, TESTING) - I reviewed patient records, labs, notes, testing and imaging myself where available.  Lab Results  Component Value Date   WBC 6.3 02/14/2018   HGB 11.7 (L) 02/14/2018   HCT 34.8 (L) 02/14/2018   MCV 92.8 02/14/2018   PLT 377.0 02/14/2018      Component Value Date/Time   NA 142 11/27/2018 1158   K 3.7 11/27/2018 1158   CL 108 11/27/2018 1158   CO2 27 11/27/2018 1158   GLUCOSE 134 (H) 11/27/2018 1158   BUN 10 11/27/2018 1158   CREATININE 0.50 11/27/2018 1158   CALCIUM 9.4 11/27/2018 1158   PROT 6.5 02/08/2018 1143   ALBUMIN 2.8 (L) 02/08/2018 1143   AST 14 (L) 02/08/2018 1143   ALT 10 02/08/2018 1143   ALKPHOS 74 02/08/2018 1143   BILITOT 0.4 02/08/2018 1143   GFRNONAA >60 02/08/2018 1143   GFRAA >60 02/08/2018 1143   Lab Results  Component Value Date   CHOL 152 02/09/2018   HDL 49 02/09/2018   LDLCALC 76 02/09/2018   TRIG 134 02/09/2018   CHOLHDL 3.1 02/09/2018   Lab Results  Component Value Date   HGBA1C 9.7 (A) 11/27/2018        ASSESSMENT AND PLAN    Sarah [name] is a 74 y.o. female with history of hypertension, hyperlipidemia, prior right MCA stroke with no gross deficits 2018 but very minimal left hand fine motor deficits, diabetes presenting to the hospital 02/08/2018 with left homonymous hemianopsia, MRI cortical and subcortical RPO infarcts.  Residual deficits of left homonymous hemianopia and gait difficulties.     Plan:  -continue plavix and continue lipitor for stroke prevention -continue to follow with PCP for HTN, HLD and DM management -monitor loop recorder for atrial fibrillation -Continue to  monitor blood pressure at home -Continue stay active and maintain a healthy diet - maintain blood pressure goal <130/80, diabetes with hemoglobin A1c goal below 7.0% and lipids with LDL cholesterol goal below 70 mg/dL.   Follow-up in 6 months or call  earlier if needed  I spent 15 minutes in total non-face to face time with the patient more than 50% of which was spent counseling and coordination of care, reviewing test results reviewing medications and discussing and reviewing the diagnosis of stroke and management of risk factors and further treatment options.  No driving until further notice  Frann Rider, [facility]  [facility] [address] [place], [place] [address]  Phone [phone] Fax [phone] Note: This document was prepared with digital dictation and possible smart phrase technology. Any transcriptional errors that result from this process are unintentional.

## 2019-01-31 DIAGNOSIS — R921 Mammographic calcification found on diagnostic imaging of breast: Secondary | ICD-10-CM | POA: Diagnosis not present

## 2019-02-01 ENCOUNTER — Telehealth: Payer: Self-pay | Admitting: *Deleted

## 2019-02-01 NOTE — Telephone Encounter (Signed)
Copied from [place] [id number]. Topic: General - Other >> Feb 01, 2019  9:38 AM Greggory Keen D wrote: Reason for CRM: pt called saying she has an appt 1/25 for a vaccine and just wants to check with Dr. [name] to make sure it is ok for her to get the vaccine with her diabetes and Blood thinner med from a previous stroke.Marland Kitchen  CB# [phone]

## 2019-02-02 NOTE — Telephone Encounter (Signed)
I do not see possible contraindication for COVID-19 vaccine after reviewing records/allergies. Blood thinner is not absolute contraindication for vaccination, she may have more risk of bleeding or hematoma at the injection site. She will greatly benefit from vaccination, so recommended. Thanks, BJ

## 2019-02-02 NOTE — Telephone Encounter (Signed)
I called and spoke with pt. We went over information below & she verbalized understanding.

## 2019-02-12 ENCOUNTER — Ambulatory Visit (INDEPENDENT_AMBULATORY_CARE_PROVIDER_SITE_OTHER): Payer: Medicare Other | Admitting: *Deleted

## 2019-02-12 DIAGNOSIS — I63411 Cerebral infarction due to embolism of right middle cerebral artery: Secondary | ICD-10-CM

## 2019-02-12 LAB — CUP PACEART REMOTE DEVICE CHECK
Date Time Interrogation Session: 20210201002743
Implantable Pulse Generator Implant Date: 20181009

## 2019-02-12 NOTE — Progress Notes (Signed)
ILR Remote 

## 2019-02-13 ENCOUNTER — Encounter: Payer: Self-pay | Admitting: Adult Health

## 2019-02-13 ENCOUNTER — Ambulatory Visit: Payer: Medicare Other | Admitting: Adult Health

## 2019-02-13 ENCOUNTER — Other Ambulatory Visit: Payer: Self-pay

## 2019-02-13 VITALS — BP 130/60 | HR 94 | Temp 97.8°F | Ht 60.0 in | Wt 141.6 lb

## 2019-02-13 DIAGNOSIS — I63511 Cerebral infarction due to unspecified occlusion or stenosis of right middle cerebral artery: Secondary | ICD-10-CM

## 2019-02-13 DIAGNOSIS — I6521 Occlusion and stenosis of right carotid artery: Secondary | ICD-10-CM

## 2019-02-13 DIAGNOSIS — I69398 Other sequelae of cerebral infarction: Secondary | ICD-10-CM

## 2019-02-13 DIAGNOSIS — I10 Essential (primary) hypertension: Secondary | ICD-10-CM | POA: Diagnosis not present

## 2019-02-13 DIAGNOSIS — Z794 Long term (current) use of insulin: Secondary | ICD-10-CM

## 2019-02-13 DIAGNOSIS — E1165 Type 2 diabetes mellitus with hyperglycemia: Secondary | ICD-10-CM | POA: Diagnosis not present

## 2019-02-13 DIAGNOSIS — R29818 Other symptoms and signs involving the nervous system: Secondary | ICD-10-CM

## 2019-02-13 DIAGNOSIS — E785 Hyperlipidemia, unspecified: Secondary | ICD-10-CM

## 2019-02-13 DIAGNOSIS — R4789 Other speech disturbances: Secondary | ICD-10-CM

## 2019-02-13 DIAGNOSIS — M542 Cervicalgia: Secondary | ICD-10-CM

## 2019-02-13 DIAGNOSIS — R2689 Other abnormalities of gait and mobility: Secondary | ICD-10-CM

## 2019-02-13 DIAGNOSIS — IMO0002 Reserved for concepts with insufficient information to code with codable children: Secondary | ICD-10-CM

## 2019-02-13 NOTE — Patient Instructions (Signed)
Continue clopidogrel 75 mg daily  and lipitor  for secondary stroke prevention  Continue to follow up with PCP regarding cholesterol, blood pressure and diabetes management   Neck pain - trial use of tylenol as needed, moist head and neck stretches. If this continues over the next 2 weeks, please follow up with [place] or your PCP regarding further evaluation  You will be called to scheduled repeat imaging of your head vessels to surveillance monitoring  Continue to monitor blood pressure at home  Maintain strict control of hypertension with blood pressure goal below 130/90, diabetes with hemoglobin A1c goal below 6.5% and cholesterol with LDL cholesterol (bad cholesterol) goal below 70 mg/dL. I also advised the patient to eat a healthy diet with plenty of whole grains, cereals, fruits and vegetables, exercise regularly and maintain ideal body weight.  Followup in the future with me in 4 months or call earlier if needed       Thank you for coming to see [place] at [facility]. I hope we have been able to provide you high quality care today.  You may receive a patient satisfaction survey over the next few weeks. We would appreciate your feedback and comments so that we may continue to improve ourselves and the health of our patients.      Neck Exercises Ask your health care provider which exercises are safe for you. Do exercises exactly as told by your health care provider and adjust them as directed. It is normal to feel mild stretching, pulling, tightness, or discomfort as you do these exercises. Stop right away if you feel sudden pain or your pain gets worse. Do not begin these exercises until told by your health care provider. Neck exercises can be important for many reasons. They can improve strength and maintain flexibility in your neck, which will help your upper back and prevent neck pain. Stretching exercises Rotation neck stretching  1. Sit in a chair or stand  up. 2. Place your feet flat on the floor, shoulder width apart. 3. Slowly turn your head (rotate) to the right until a slight stretch is felt. Turn it all the way to the right so you can look over your right shoulder. Do not tilt or tip your head. 4. Hold this position for 10-30 seconds. 5. Slowly turn your head (rotate) to the left until a slight stretch is felt. Turn it all the way to the left so you can look over your left shoulder. Do not tilt or tip your head. 6. Hold this position for 10-30 seconds. Repeat __________ times. Complete this exercise __________ times a day. Neck retraction 1. Sit in a sturdy chair or stand up. 2. Look straight ahead. Do not bend your neck. 3. Use your fingers to push your chin backward (retraction). Do not bend your neck for this movement. Continue to face straight ahead. If you are doing the exercise properly, you will feel a slight sensation in your throat and a stretch at the back of your neck. 4. Hold the stretch for 1-2 seconds. Repeat __________ times. Complete this exercise __________ times a day. Strengthening exercises Neck press 1. Lie on your back on a firm bed or on the floor with a pillow under your head. 2. Use your neck muscles to push your head down on the pillow and straighten your spine. 3. Hold the position as well as you can. Keep your head facing up (in a neutral position) and your chin tucked. 4. Slowly count to  5 while holding this position. Repeat __________ times. Complete this exercise __________ times a day. Isometrics These are exercises in which you strengthen the muscles in your neck while keeping your neck still (isometrics). 1. Sit in a supportive chair and place your hand on your forehead. 2. Keep your head and face facing straight ahead. Do not flex or extend your neck while doing isometrics. 3. Push forward with your head and neck while pushing back with your hand. Hold for 10 seconds. 4. Do the sequence again, this time  putting your hand against the back of your head. Use your head and neck to push backward against the hand pressure. 5. Finally, do the same exercise on either side of your head, pushing sideways against the pressure of your hand. Repeat __________ times. Complete this exercise __________ times a day. Prone head lifts 1. Lie face-down (prone position), resting on your elbows so that your chest and upper back are raised. 2. Start with your head facing downward, near your chest. Position your chin either on or near your chest. 3. Slowly lift your head upward. Lift until you are looking straight ahead. Then continue lifting your head as far back as you can comfortably stretch. 4. Hold your head up for 5 seconds. Then slowly lower it to your starting position. Repeat __________ times. Complete this exercise __________ times a day. Supine head lifts 1. Lie on your back (supine position), bending your knees to point to the ceiling and keeping your feet flat on the floor. 2. Lift your head slowly off the floor, raising your chin toward your chest. 3. Hold for 5 seconds. Repeat __________ times. Complete this exercise __________ times a day. Scapular retraction 1. Stand with your arms at your sides. Look straight ahead. 2. Slowly pull both shoulders (scapulae) backward and downward (retraction) until you feel a stretch between your shoulder blades in your upper back. 3. Hold for 10-30 seconds. 4. Relax and repeat. Repeat __________ times. Complete this exercise __________ times a day. Contact a health care provider if:  Your neck pain or discomfort gets much worse when you do an exercise.  Your neck pain or discomfort does not improve within 2 hours after you exercise. If you have any of these problems, stop exercising right away. Do not do the exercises again unless your health care provider says that you can. Get help right away if:  You develop sudden, severe neck pain. If this happens, stop  exercising right away. Do not do the exercises again unless your health care provider says that you can. This information is not intended to replace advice given to you by your health care provider. Make sure you discuss any questions you have with your health care provider. Document Revised: 10/26/2017 Document Reviewed: 10/26/2017 Elsevier Patient Education  [employer].

## 2019-02-13 NOTE — Progress Notes (Signed)
[facility]  PATIENT: Sarah [name] DOB: [birth year]    REASON FOR VISIT: Follow-up for stroke right MCA 10/2016 readmitted 02/08/2018 for right parieto-occipital stroke  HISTORY FROM: Patient   Chief Complaint  Patient presents with  . Follow-up    6 mon f/u. Alone. Rm 9. Patient mentioned that she has been off balance alot here lately. She hasn't had any falls.       HISTORY OF PRESENT ILLNESS:  Update 02/13/2019: Sarah [name] is a 74 year old female who is being seen today for stroke follow-up.  She has been stable from a stroke standpoint with residual mild stable left homonymous hemianopsia, mild imbalance which she believes has been slightly worsening over the past couple of months along with possible worsening of prior aphasia from stroke in 2018.  She does admit to limited physical activity and does not have an established exercise regimen along with endorsing increased stress currently caring for her mother with increased illness and increased stress due to overall COVID-19 pandemic.  She has recently signed up for [employer] program which will be starting in March.  She also states for the past 2 weeks, she has been experiencing left arm numbness/tingling but will resolve quickly and symptoms intermittent.  She does endorse left-sided neck stiffness that radiates into her upper trapezius and increased pain when looking towards the left.  Denies any increased weakness.  Unable to determine if numbness/tingling symptoms occur in certain positions.  She has not previously experienced this sensation.  Denies any other new or worsening neurological symptoms.  She did have bilateral cataract surgery in 0000000 without complication. Continues on Plavix and atorvastatin for secondary stroke prevention without side effects.  Blood pressure today 130/60.  Continues to follow with PCP for DM management with recent A1c 9.7.  Loop recorder is not shown atrial  fibrillation thus far.  Monitoring/battery life will be completed 10/2019.  She has received her first dose of Covid vaccination and plans on receiving second dose next week.  Denies new or worsening stroke/TIA symptoms.  No further concerns at this time.   06/27/2018 VIRTUAL VISIT: Sarah [name] is being seen today for three-month follow-up visit regarding recent stroke.  Initially scheduled for office visit follow-up but due to COVID-19 safety precautions, visit transition to telemedicine via MyChart with patient's consent.  She has been stable from a stroke standpoint with residual left homonymous hemianopsia and balance difficulties.  She continues to participate in outpatient PT with ongoing improvement.  Continues on Plavix and atorvastatin for secondary stroke prevention without reported side effects. Blood pressure monitored at home.  Lab work obtained yesterday with A1c 10.3 (prior 13.5).  She continues to follow with PCP for ongoing management.  Loop recorder has not shown atrial fibrillation thus far.  No further concerns at this time.  Denies new or worsening stroke/TIA symptoms.    Hospital follow-up 03/23/2018 CM: Sarah [name], 74 year old female returns for follow-up with readmission for stroke 1/29/ 2020.  MRI of the brain acute cortical and subcortical RPO infarcts.  She initially presented with left hemianopsia.  She is now on Plavix and aspirin for 3 months and then Plavix alone.  She has not had further stroke or TIA symptoms.  She has minimal bruising and no bleeding.  Hemoglobin A1c 13.5 on hospital admission.  She claims she is trying to get that down to a more reasonable level.  She remains on Lipitor without myalgias.  Blood pressure in the office today 146/87.  She  continues to go to [facility].  She is to start rehab physical therapy and occupational therapy today.  Loop recorder so far no atrial fibrillation.  She is not driving due to her vision.  She returns for  reevaluation   HOSPITAL SUMMARY: right parieto-occipital infarcts in setting of progressive R MCA stenosis, infarcts secondary to large vessel disease source  Resultant L hemianopia   CT head R parietal watershed infarct. Small vessel disease.   MRI  Acute cortical and subcortical R P-O infarcts. Old R CR and R frontal lobe infarcts. Small vessel disease.   MRA head  No LVO. Focal stenosis R M1, R M2/M3, L M1  MRA neck B VA origin atherosclerosis 30-50%  2D Echo  pending   Loop interrogation - last check 1/3 w/o AF - interrogation today without AF  LDL 76  HgbA1c 13.5  Lovenox 40 mg sq daily for VTE prophylaxis  clopidogrel 75 mg daily prior to admission, now on aspirin 325 mg daily and clopidogrel 75 mg daily. Continue DAPT x 3 months then plavix alone  Therapy recommendations:  OP OT and PT  Disposition:  Home  Patient advised not to drive          REVIEW OF SYSTEMS: Full 14 system review of systems performed and notable only for those listed, all others are neg:  Visual loss, balance deficits, occasional word finding difficulty, increased stress  ALLERGIES: No Known Allergies  HOME MEDICATIONS:   PAST MEDICAL HISTORY: Past Medical History:  Diagnosis Date  . Boils   . Glaucoma   . Heart murmur   . HTN (hypertension)   . Hx of adenomatous colonic polyps   . Hyperlipidemia   . Osteoporosis   . Pneumonia   . Stroke ([place])   . Type II or unspecified type diabetes mellitus without mention of complication, uncontrolled     PAST SURGICAL HISTORY: Past Surgical History:  Procedure Laterality Date  . ABDOMINAL HYSTERECTOMY    . CARPAL TUNNEL RELEASE     right  . COLONOSCOPY  12-10-10   per Dr. Deatra Ina, clear, repeat in 7 yrs   . INCISION AND DRAINAGE PERIRECTAL ABSCESS N/A 09/26/2015   Procedure: IRRIGATION AND DEBRIDEMENT PERIRECTAL ABSCESS;  Surgeon: Mickeal Skinner, MD;  Location: [facility];  Service: General;  Laterality: N/A;  . KNEE  ARTHROSCOPY     right  . LOOP RECORDER INSERTION N/A 10/19/2016   Procedure: LOOP RECORDER INSERTION;  Surgeon: Thompson Grayer, MD;  Location: [facility];  Service: Cardiovascular;  Laterality: N/A;  . ORIF ANKLE FRACTURE Right 03/24/2012   Procedure: OPEN REDUCTION INTERNAL FIXATION (ORIF) ANKLE FRACTURE;  Surgeon: Newt Minion, MD;  Location: [facility];  Service: Orthopedics;  Laterality: Right;  Open Reduction Internal Fixation Right Bimalleolar ankle fracture  . POLYPECTOMY    . TEE WITHOUT CARDIOVERSION N/A 10/18/2016   Procedure: TRANSESOPHAGEAL ECHOCARDIOGRAM (TEE);  Surgeon: Fay Records, MD;  Location[facility];  Service: Cardiovascular;  Laterality: N/A;  . TONSILLECTOMY      FAMILY HISTORY: Family History  Problem Relation Age of Onset  . Diabetes Mother   . Hypertension Mother   . Stroke Father   . Stroke Maternal Uncle   . Stroke Paternal Uncle   . Colon cancer Neg Hx   . Esophageal cancer Neg Hx   . Rectal cancer Neg Hx   . Stomach cancer Neg Hx     SOCIAL HISTORY: Social History   Socioeconomic History  .  Marital status: Single    Spouse name: Not on file  . Number of children: Not on file  . Years of education: Not on file  . Highest education level: Not on file  Occupational History  . Not on file  Tobacco Use  . Smoking status: Former Smoker    Types: Cigarettes    Quit date: 10/15/2016    Years since quitting: 2.3  . Smokeless tobacco: Never Used  . Tobacco comment: smokes occ.   Substance and Sexual Activity  . Alcohol use: Yes    Alcohol/week: 0.0 standard drinks    Comment: occ  . Drug use: No  . Sexual activity: Not on file  Other Topics Concern  . Not on file  Social History Narrative  . Not on file   Social Determinants of Health   Financial Resource Strain:   . Difficulty of Paying Living Expenses: Not on file  Food Insecurity:   . Worried About Charity fundraiser in the Last Year: Not on file  . Ran Out of Food in the Last  Year: Not on file  Transportation Needs:   . Lack of Transportation (Medical): Not on file  . Lack of Transportation (Non-Medical): Not on file  Physical Activity:   . Days of Exercise per Week: Not on file  . Minutes of Exercise per Session: Not on file  Stress:   . Feeling of Stress : Not on file  Social Connections:   . Frequency of Communication with Friends and Family: Not on file  . Frequency of Social Gatherings with Friends and Family: Not on file  . Attends Religious Services: Not on file  . Active Member of Clubs or Organizations: Not on file  . Attends Archivist Meetings: Not on file  . Marital Status: Not on file  Intimate Partner Violence:   . Fear of Current or Ex-Partner: Not on file  . Emotionally Abused: Not on file  . Physically Abused: Not on file  . Sexually Abused: Not on file     PHYSICAL EXAM  Today's Vitals   02/13/19 1316  BP: 130/60  Pulse: 94  Temp: 97.8 F (36.6 C)  TempSrc: Oral  Weight: 141 lb 9.6 oz (64.2 kg)  Height: 5' (1.524 m)   Body mass index is 27.65 kg/m.  General: well developed, well nourished, pleasant elderly African-American female, seated, in no evident distress Head: head normocephalic and atraumatic.   Neck: supple with no carotid or supraclavicular bruits Cardiovascular: regular rate and rhythm, no murmurs Musculoskeletal: no deformity; tenderness upon palpation of tight left sternocleidomastoid and trapezius  Skin:  no rash/petichiae Vascular:  Normal pulses all extremities   Neurologic Exam Mental Status: Awake and fully alert.   Occasional stuttering of speech/speech hesitancy.  Oriented to place and time. Recent and remote memory intact. Attention span, concentration and fund of knowledge appropriate. Mood and affect appropriate.  Cranial Nerves: Pupils equal, briskly reactive to light. Extraocular movements full without nystagmus. Visual fields possible mild left homonymous hemianopia. Hearing intact.  Facial sensation intact. Face, tongue, palate moves normally and symmetrically.  Motor: Normal bulk and tone. Normal strength in all tested extremity muscles. Sensory.: intact to touch , pinprick , position and vibratory sensation.  Coordination: Rapid alternating movements normal in all extremities. Finger-to-nose and heel-to-shin performed accurately bilaterally. Gait and Station: Arises from chair without difficulty. Stance is normal. Gait demonstrates normal stride length and balance without use of assistive device Reflexes: 1+ and symmetric. Toes downgoing.  DIAGNOSTIC DATA (LABS, IMAGING, TESTING) - I reviewed patient records, labs, notes, testing and imaging myself where available.  Lab Results  Component Value Date   WBC 6.3 02/14/2018   HGB 11.7 (L) 02/14/2018   HCT 34.8 (L) 02/14/2018   MCV 92.8 02/14/2018   PLT 377.0 02/14/2018      Component Value Date/Time   NA 142 11/27/2018 1158   K 3.7 11/27/2018 1158   CL 108 11/27/2018 1158   CO2 27 11/27/2018 1158   GLUCOSE 134 (H) 11/27/2018 1158   BUN 10 11/27/2018 1158   CREATININE 0.50 11/27/2018 1158   CALCIUM 9.4 11/27/2018 1158   PROT 6.5 02/08/2018 1143   ALBUMIN 2.8 (L) 02/08/2018 1143   AST 14 (L) 02/08/2018 1143   ALT 10 02/08/2018 1143   ALKPHOS 74 02/08/2018 1143   BILITOT 0.4 02/08/2018 1143   GFRNONAA >60 02/08/2018 1143   GFRAA >60 02/08/2018 1143   Lab Results  Component Value Date   CHOL 152 02/09/2018   HDL 49 02/09/2018   LDLCALC 76 02/09/2018   TRIG 134 02/09/2018   CHOLHDL 3.1 02/09/2018   Lab Results  Component Value Date   HGBA1C 9.7 (A) 11/27/2018        ASSESSMENT AND PLAN    Ms. BERENISE KLUGE is a 74 y.o. female with history of hypertension, hyperlipidemia, prior right MCA stroke with no gross deficits 2018 but very minimal left hand fine motor deficits, diabetes presenting to the hospital 02/08/2018 with left homonymous hemianopsia, MRI cortical and subcortical RPO  infarcts.  Residual deficits of left homonymous hemianopia and gait difficulties.   Previously stable but returns today with concerns of mild worsening of imbalance and speech hesitancy.  She initially stated speech difficulty after prior stroke in 2018 but unable to verify this by review of notes.  She does endorse increased stressors likely due to COVID-19 pandemic and increasing care for her elderly mother.  She also has 2-week onset of intermittent LUE numbness/tingling and left-sided neck pain ?  Tension/muscle skeletal related  Plan:  -Due to worsening of prior symptoms, and speech difficulty not previously reported, recommend obtaining MRI to rule out new or worsening prior stroke.  Also recommend obtaining MRA head for surveillance monitoring of known intracranial stenosis -Highly encouraged stress reduction techniques and increasing physical activity.  Discussion regarding great benefit with participation next month with [employer] program -Recommend use of Tylenol as needed for neck pain, use of moist heat, ensure proper posture, proper pillow at night and neck exercises for possible tension related neck pain which has been present over the past 2 weeks.  Recommend possibly obtaining EMG/NCV if symptoms do not subside -continue plavix and continue lipitor for stroke prevention -continue to follow with PCP for HTN, HLD and DM management -monitor loop recorder for atrial fibrillation -Continue to monitor blood pressure at home -Continue stay active and maintain a healthy diet - maintain blood pressure goal <130/80, diabetes with hemoglobin A1c goal below 7.0% and lipids with LDL cholesterol goal below 70 mg/dL.   Follow-up in 4 months or call earlier if needed -discussion regarding importance of proceeding to ED or calling in the morning with any worsening symptoms or possible stroke symptoms  I spent 30 minutes in total face to face time with the patient more than 50% of which was spent  counseling and coordination of care, discussion regarding worsening of prior stroke symptoms and possible worsening of speech versus new onset and recent onset of left  arm numbness/tingling, possible etiologies and further work-up, reviewing test results reviewing medications and discussing and reviewing the diagnosis of stroke and management of risk factors and further treatment options and answered all questions to patient satisfaction  Frann Rider, AGNP-BC  [facility] [address] [place], [place] [address]  Phone [phone] Fax [phone] Note: This document was prepared with digital dictation and possible smart phrase technology. Any transcriptional errors that result from this process are unintentional.

## 2019-02-14 DIAGNOSIS — E113293 Type 2 diabetes mellitus with mild nonproliferative diabetic retinopathy without macular edema, bilateral: Secondary | ICD-10-CM | POA: Diagnosis not present

## 2019-02-14 DIAGNOSIS — H401131 Primary open-angle glaucoma, bilateral, mild stage: Secondary | ICD-10-CM | POA: Diagnosis not present

## 2019-02-14 NOTE — Progress Notes (Signed)
I agree with the above plan 

## 2019-02-16 ENCOUNTER — Telehealth: Payer: Self-pay | Admitting: Adult Health

## 2019-02-16 NOTE — Telephone Encounter (Signed)
[employer] medicare order sent to GI. No auth they will reach out to the patient to schedule.  

## 2019-03-09 ENCOUNTER — Other Ambulatory Visit: Payer: Self-pay

## 2019-03-09 ENCOUNTER — Ambulatory Visit
Admission: RE | Admit: 2019-03-09 | Discharge: 2019-03-09 | Disposition: A | Discharge status: Home / Self Care | Payer: Medicare Other | Source: Ambulatory Visit | Attending: Adult Health | Admitting: Adult Health

## 2019-03-09 ENCOUNTER — Other Ambulatory Visit: Payer: Self-pay | Admitting: Family Medicine

## 2019-03-09 DIAGNOSIS — I69398 Other sequelae of cerebral infarction: Secondary | ICD-10-CM | POA: Diagnosis not present

## 2019-03-09 DIAGNOSIS — R4789 Other speech disturbances: Secondary | ICD-10-CM | POA: Diagnosis not present

## 2019-03-09 DIAGNOSIS — I6521 Occlusion and stenosis of right carotid artery: Secondary | ICD-10-CM | POA: Diagnosis not present

## 2019-03-12 ENCOUNTER — Other Ambulatory Visit: Payer: Self-pay | Admitting: Adult Health

## 2019-03-12 ENCOUNTER — Telehealth: Payer: Self-pay | Admitting: *Deleted

## 2019-03-12 DIAGNOSIS — I69319 Unspecified symptoms and signs involving cognitive functions following cerebral infarction: Secondary | ICD-10-CM

## 2019-03-12 DIAGNOSIS — R200 Anesthesia of skin: Secondary | ICD-10-CM

## 2019-03-12 DIAGNOSIS — R202 Paresthesia of skin: Secondary | ICD-10-CM

## 2019-03-12 DIAGNOSIS — R2689 Other abnormalities of gait and mobility: Secondary | ICD-10-CM

## 2019-03-12 DIAGNOSIS — I69398 Other sequelae of cerebral infarction: Secondary | ICD-10-CM

## 2019-03-12 NOTE — Telephone Encounter (Signed)
It would be reasonable to trial possible benefit with use of physical therapy.  She would also likely benefit from cognitive therapy for memory concerns.  If she is interested in pursuing therapies, please let me know and orders will be placed.  We may also proceed obtaining EMG/NCV for further evaluation of left arm numbness/tingling.  Also long discussion at prior visit regarding importance of stress reduction techniques which can be contributing with memory concerns and would recommend speaking about underlying depression/anxiety further with her PCP and may possibly benefit from psychiatry

## 2019-03-12 NOTE — Telephone Encounter (Signed)
I spoke to pt and relayed that the MRI/MRA imaging studies did not show any acute/new findings since MRI done in January.  I relayed that this is good.  She states that she continues with balance issues (no falls, has been in touch with silver sneakers and will follow up with them about this).  She also continues with her not being able to get her thoughts out, no worse but continues.  Is this something that will persist , stay same, get better, need some kind of therapy? Please advise.  She also has done neck exercises for the numbness/pins and needles and proceed with [place]/EMG?

## 2019-03-12 NOTE — Telephone Encounter (Signed)
-----   Message from Frann Rider, NP sent at 03/12/2019  7:28 AM EST ----- Please advise patient that recent MRI did not show any acute/new findings since prior MRI in January

## 2019-03-12 NOTE — Telephone Encounter (Signed)
I called pt and relayed that per JM/P that PT or cognitive therapy would be helpful and she is willing to do, Neuro rehab is who she had gone to before.

## 2019-03-15 ENCOUNTER — Ambulatory Visit (INDEPENDENT_AMBULATORY_CARE_PROVIDER_SITE_OTHER): Payer: Medicare Other | Admitting: *Deleted

## 2019-03-15 DIAGNOSIS — I63411 Cerebral infarction due to embolism of right middle cerebral artery: Secondary | ICD-10-CM

## 2019-03-15 LAB — CUP PACEART REMOTE DEVICE CHECK
Date Time Interrogation Session: 20210304030906
Implantable Pulse Generator Implant Date: 20181009

## 2019-03-15 NOTE — Progress Notes (Signed)
ILR Remote 

## 2019-03-18 IMAGING — MR MR HEAD W/O CM
9 of 11 series · 30 of 48 positions shown · non-contrast
Comparison: Head CT 10/15/2016

Brain MRI 03/04/2007

CLINICAL DATA: Left arm and hand weakness.  Slurred screw.

EXAM:
MRI HEAD WITHOUT CONTRAST
MRA HEAD WITHOUT CONTRAST
TECHNIQUE: Multiplanar, multiecho pulse sequences of the brain and surrounding
structures were obtained without intravenous contrast. Angiographic
images of the head were obtained using MRA technique without
contrast.

[Series 3: DWI · axial · 3.0mm · 0.94mm/px · z∈[-50,+88]mm · 7 of 94 slices shown (1 of 2)]
[im 1/94]
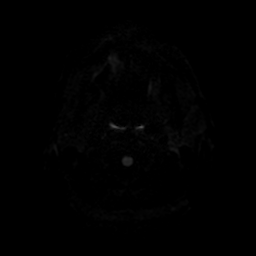
[im 16/94]
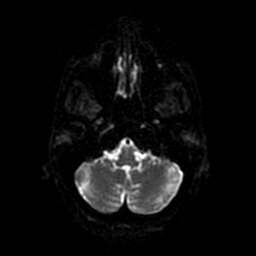
[im 32/94]
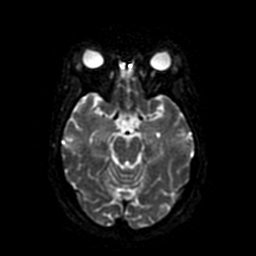
[im 47/94]
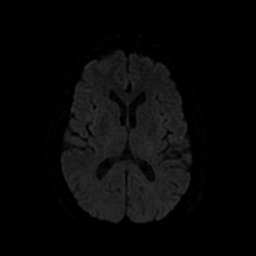
[im 63/94]
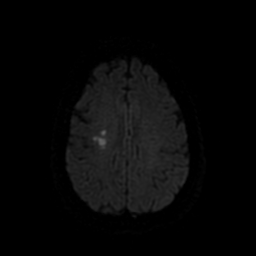
[im 78/94]
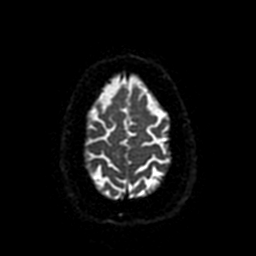
[im 94/94]
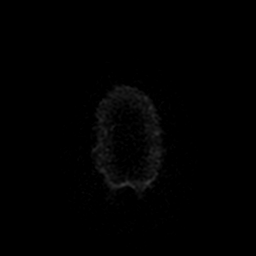

[Series 4: ax (id) 2 · axial · 1.0mm · 0.43mm/px · z∈[-41,+12]mm · 5 of 184 slices shown]
[im 1/184]
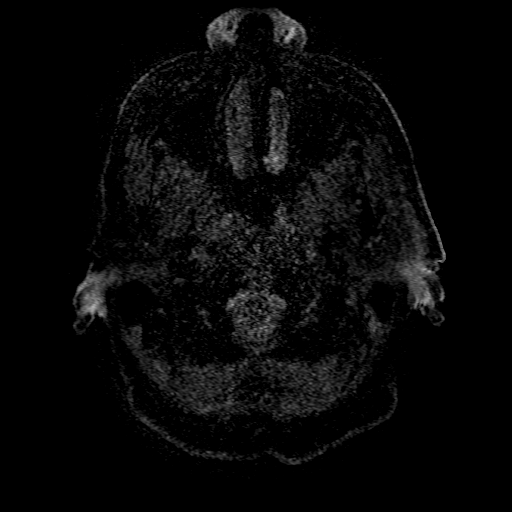
[im 31/184]
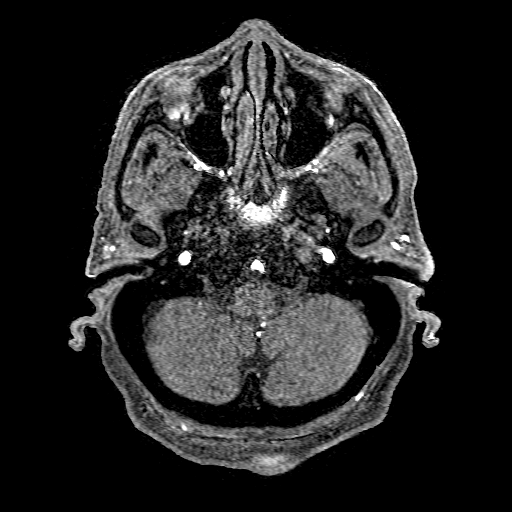
[im 62/184]
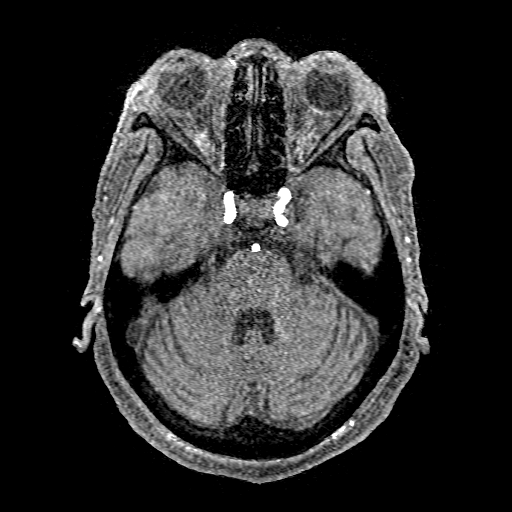
[im 77/184]
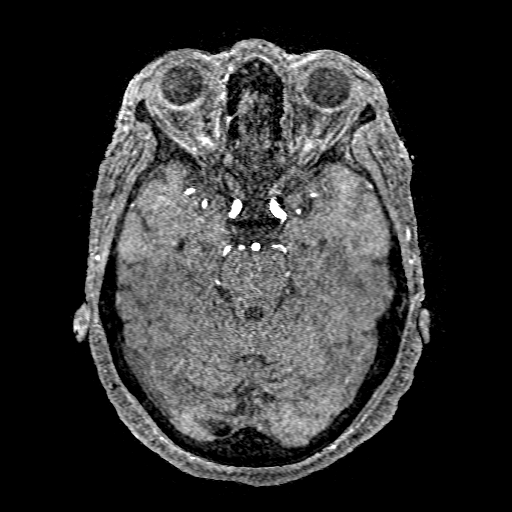
[im 107/184]
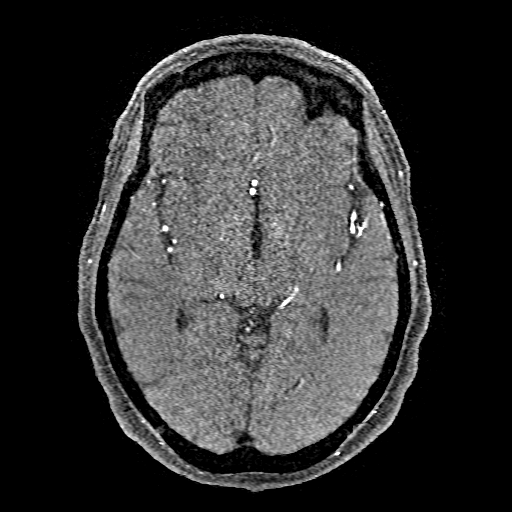

[Series 5: DWI · coronal · 4.0mm · 0.94mm/px · 5 of 64 slices shown (2 of 2)]
[im 1/64]
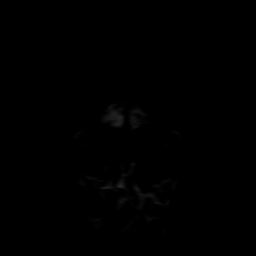
[im 16/64]
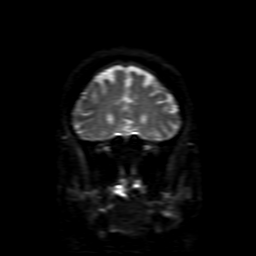
[im 32/64]
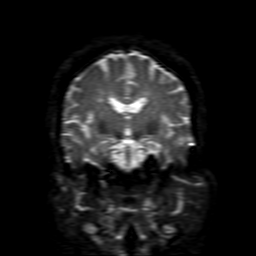
[im 48/64]
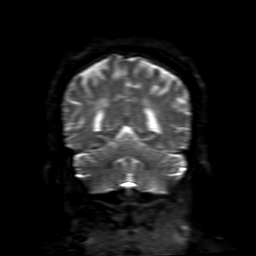
[im 64/64]
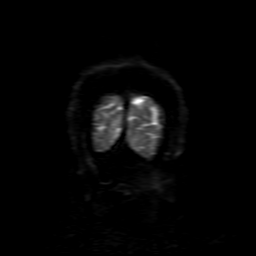

[Series 6: FLAIR · sagittal · 5.0mm · 0.47mm/px · 2 of 23 slices shown (1 of 2)]
[im 1/23]
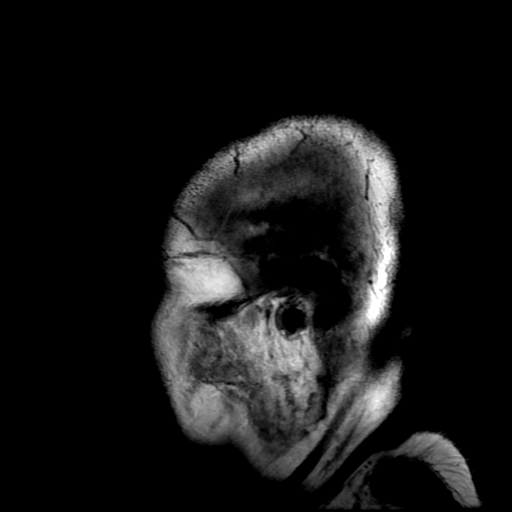
[im 23/23]
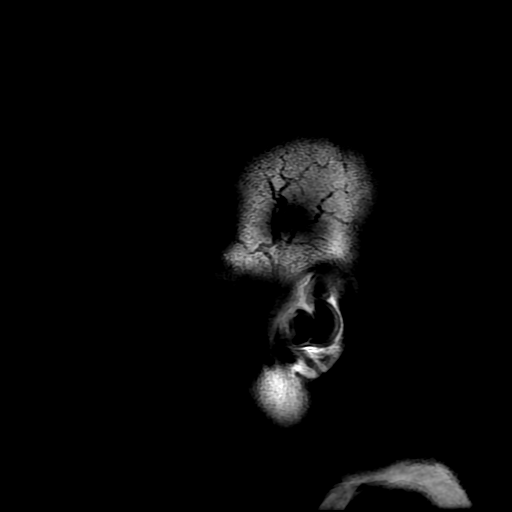

[Series 7: T2 · axial · 5.0mm · 0.47mm/px · z∈[-48,+89]mm · 2 of 24 slices shown (1 of 2)]
[im 1/24]
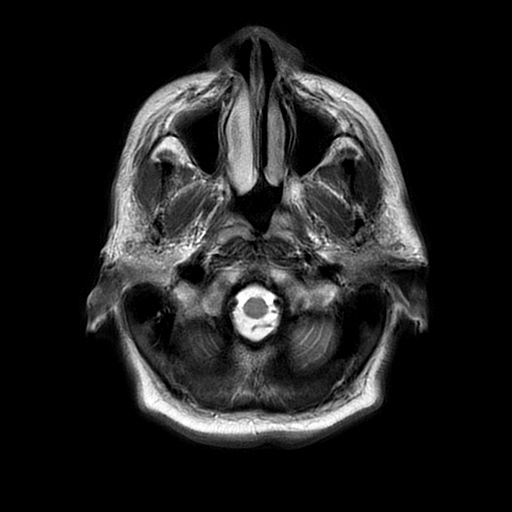
[im 24/24]
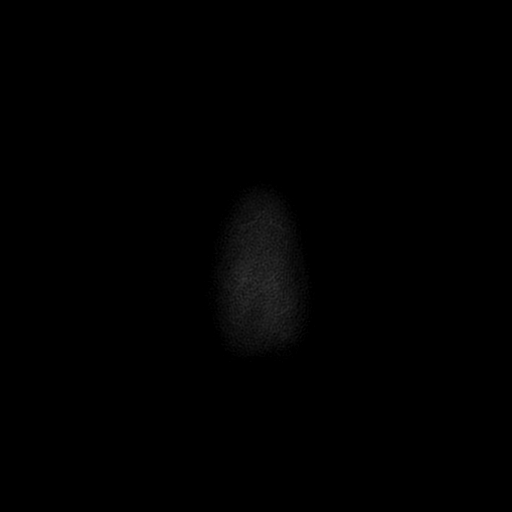

[Series 8: FLAIR · axial · 5.0mm · 0.47mm/px · z∈[-48,+89]mm · 2 of 24 slices shown (2 of 2)]
[im 1/24]
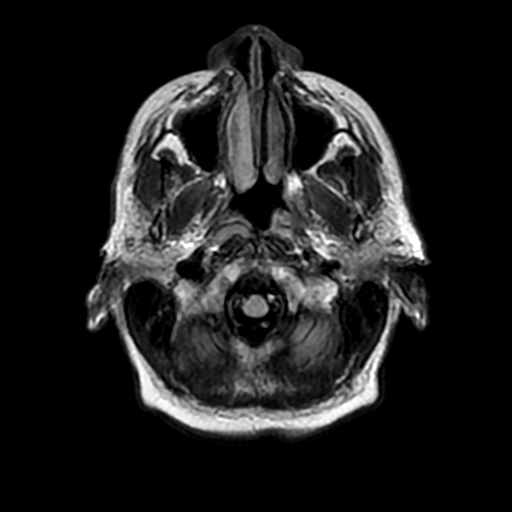
[im 24/24]
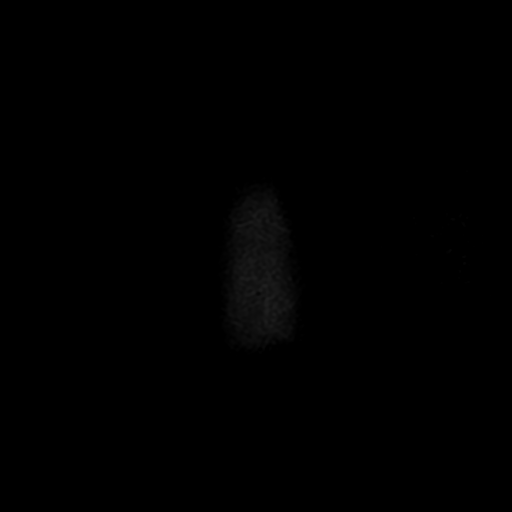

[Series 11: T2 · coronal · 5.0mm · 0.39mm/px · 2 of 27 slices shown (2 of 2)]
[im 1/27]
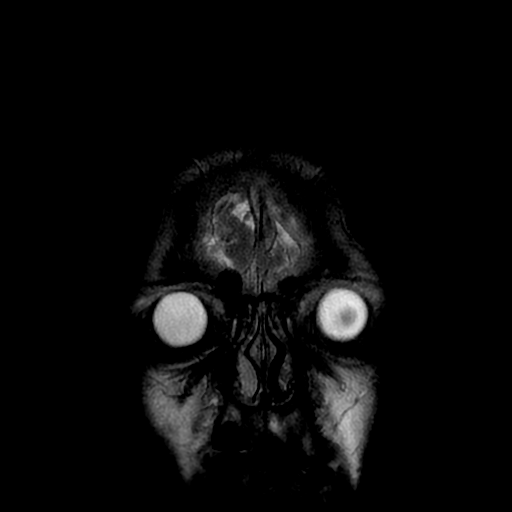
[im 27/27]
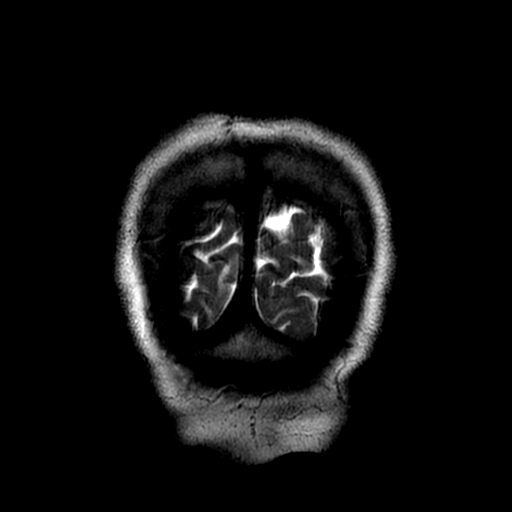

[Series 350: ADC · axial · 3.0mm · 0.94mm/px · z∈[-50,+88]mm · 3 of 47 slices shown (1 of 2)]
[im 1/47]
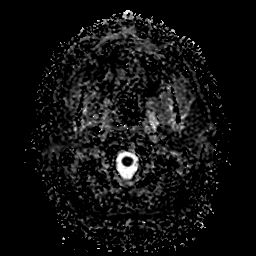
[im 24/47]
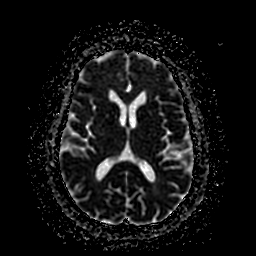
[im 47/47]
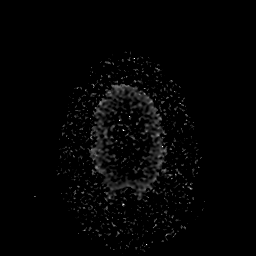

[Series 550: ADC · coronal · 4.0mm · 0.94mm/px · 2 of 32 slices shown (2 of 2)]
[im 1/32]
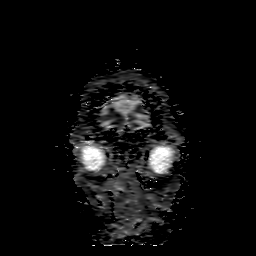
[im 32/32]
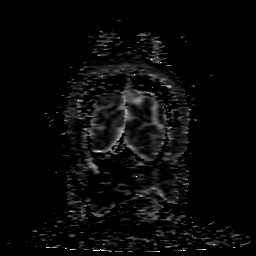

[30 of 48 positions shown; findings below may reference images not displayed]

FINDINGS: MRI HEAD FINDINGS

Brain: The midline structures are normal. There are numerous
scattered foci of diffusion restriction with in the right MCA
distribution. The largest area is in the right centrum semiovale and
measures approximately 1.5 cm. There is no mass effect. No acute
hemorrhage. There is multifocal cytotoxic edema associated with
these lesions. There is multifocal hyperintense T2-weighted signal
within the periventricular white matter, most often seen in the
setting of chronic microvascular ischemia. Single focus of chronic
microhemorrhage in the left caudate head. No hydrocephalus, age
advanced atrophy or lobar predominant volume loss. No dural
abnormality or extra-axial collection.

Skull and upper cervical spine: The visualized skull base,
calvarium, upper cervical spine and extracranial soft tissues are
normal.

Sinuses/Orbits: No fluid levels or advanced mucosal thickening. No
mastoid effusion. Normal orbits.

MRA HEAD FINDINGS

Intracranial internal carotid arteries: Normal.

Anterior cerebral arteries: Normal.

Middle cerebral arteries: The distal right M1 segment is occluded.
This is distal to an early bifurcation. There is normal flow related
enhancement distally within the right MCA distribution. The left
middle cerebral artery is normal.

Posterior communicating arteries: Absent bilaterally

Posterior cerebral arteries: Normal.

Basilar artery: Normal.

Vertebral arteries: Codominant. Normal.

Superior cerebellar arteries: Normal.

Anterior inferior cerebellar arteries: Right-dominant

Posterior inferior cerebellar arteries: Left dominant
IMPRESSION: 1. Distal right middle cerebral artery M1 segment occlusion.
2. Multifocal acute ischemia within the right MCA distribution, with
the largest area measuring 1.5 cm.
3. No acute hemorrhage or mass effect.
Findings communicated via text page to Dr. Winnie Tiger at
[DATE] a.m., 10/16/2016.

## 2019-03-27 ENCOUNTER — Ambulatory Visit: Payer: Medicare Other | Admitting: Neurology

## 2019-03-27 ENCOUNTER — Other Ambulatory Visit: Payer: Self-pay

## 2019-03-27 ENCOUNTER — Ambulatory Visit (INDEPENDENT_AMBULATORY_CARE_PROVIDER_SITE_OTHER): Payer: Medicare Other | Admitting: Neurology

## 2019-03-27 ENCOUNTER — Encounter: Payer: Self-pay | Admitting: Neurology

## 2019-03-27 DIAGNOSIS — G5603 Carpal tunnel syndrome, bilateral upper limbs: Secondary | ICD-10-CM

## 2019-03-27 DIAGNOSIS — R202 Paresthesia of skin: Secondary | ICD-10-CM

## 2019-03-27 DIAGNOSIS — R200 Anesthesia of skin: Secondary | ICD-10-CM | POA: Diagnosis not present

## 2019-03-27 HISTORY — DX: Carpal tunnel syndrome, bilateral upper limbs: G56.03

## 2019-03-27 NOTE — Progress Notes (Signed)
Please refer to EMG and nerve conduction procedure note.  

## 2019-03-27 NOTE — Procedures (Signed)
     HISTORY:  Sarah [name] is a 74 year old patient with a history of cerebrovascular disease and diabetes who notes a 1 or 72-month history of numbness in the right hand and arm.  The patient denies any significant neck pain or pain down the arm.  She is being evaluated for a possible neuropathy or a cervical radiculopathy.  NERVE CONDUCTION STUDIES:  Nerve conduction studies were performed on both upper extremities.  The distal motor latencies for the median nerves were borderline normal on the right and prolonged on the left with normal motor amplitudes for these nerves bilaterally.  The nerve conduction velocities for the median nerves were normal bilaterally.  The distal motor latencies for the ulnar nerves were normal bilaterally but with a slightly low motor amplitude on the left, normal on the right.  The nerve conduction velocities for the ulnar nerves were normal bilaterally.  The sensory latencies for the radial nerves were normal bilaterally.  The sensory latencies for the median nerves were prolonged bilaterally.  The sensory latencies for the ulnar nerves were normal bilaterally.  The F-wave latencies for the ulnar nerves were normal bilaterally.  EMG STUDIES:  EMG study was performed on the right upper extremity:  The first dorsal interosseous muscle reveals 2 to 4 K units with full recruitment. No fibrillations or positive waves were noted. The abductor pollicis brevis muscle reveals 2 to 4 K units with slightly reduced recruitment. No fibrillations or positive waves were noted. The extensor indicis proprius muscle reveals 1 to 3 K units with full recruitment. No fibrillations or positive waves were noted. The pronator teres muscle reveals 2 to 3 K units with full recruitment. No fibrillations or positive waves were noted. The biceps muscle reveals 1 to 2 K units with full recruitment. No fibrillations or positive waves were noted. The triceps muscle reveals 2 to 4 K units  with full recruitment. No fibrillations or positive waves were noted. The anterior deltoid muscle reveals 2 to 3 K units with full recruitment. No fibrillations or positive waves were noted. The cervical paraspinal muscles were tested at 2 levels. No abnormalities of insertional activity were seen at either level tested. There was good relaxation.   IMPRESSION:  Nerve conduction studies done on both upper extremities shows evidence of mild bilateral carpal tunnel syndrome.  EMG evaluation of the right upper extremity does not show evidence of an overlying cervical radiculopathy.  Jill Alexanders MD 03/27/2019 3:42 PM  [facility] [address] [place], [place] [address]  Phone [phone] Fax [phone]

## 2019-03-28 ENCOUNTER — Telehealth: Payer: Self-pay | Admitting: *Deleted

## 2019-03-28 NOTE — Telephone Encounter (Signed)
LVM informing that recent EMG/NCS did show mild bilateral carpal tunnel syndrome but otherwise unremarkable. If she continues to experience pain, Janett Billow advised her to follow-up further with PCP for further evaluation.  Left # for questions.

## 2019-03-29 NOTE — Progress Notes (Signed)
[place]    Nerve / Sites Muscle Latency Ref. Amplitude Ref. Rel Amp Segments Distance Velocity Ref. Area    ms ms mV mV %  cm m/s m/s mVms  L Median - APB     Wrist APB 4.2 ?4.4 7.0 ?4.0 100 Wrist - APB 7   22.2     Upper arm APB 7.9  6.9  98.8 Upper arm - Wrist 18 49 ?49 22.1  R Median - APB     Wrist APB 4.0 ?4.4 7.3 ?4.0 100 Wrist - APB 7   24.5     Upper arm APB 7.9  6.6  90.4 Upper arm - Wrist 19 49 ?49 22.7  L Ulnar - ADM     Wrist ADM 3.3 ?3.3 3.6 ?6.0 100 Wrist - ADM 7   15.7     B.Elbow ADM 6.6  3.6  99 B.Elbow - Wrist 17 52 ?49 15.6     A.Elbow ADM 8.5  3.3  90.6 A.Elbow - B.Elbow 10 52 ?49 15.2         A.Elbow - Wrist      R Ulnar - ADM     Wrist ADM 3.2 ?3.3 6.3 ?6.0 100 Wrist - ADM 7   25.3     B.Elbow ADM 6.2  6.3  99.9 B.Elbow - Wrist 18 60 ?49 21.4     A.Elbow ADM 7.9  5.9  94.2 A.Elbow - B.Elbow 10 59 ?49 20.7         A.Elbow - Wrist                 SNC    Nerve / Sites Rec. Site Peak Lat Ref.  Amp Ref. Segments Distance Peak Diff Ref.    ms ms V V  cm ms ms  L Radial - Anatomical snuff box (Forearm)     Forearm Wrist 2.6 ?2.9 17 ?15 Forearm - Wrist 10    R Radial - Anatomical snuff box (Forearm)     Forearm Wrist 2.6 ?2.9 15 ?15 Forearm - Wrist 10    L Median, Ulnar - Transcarpal comparison     Median Palm Wrist 2.8 ?2.2 27 ?35 Median Palm - Wrist 8       Ulnar Palm Wrist 2.4 ?2.2 6 ?12 Ulnar Palm - Wrist 8          Median Palm - Ulnar Palm  0.4 ?0.4  R Median, Ulnar - Transcarpal comparison     Median Palm Wrist 2.8 ?2.2 39 ?35 Median Palm - Wrist 8       Ulnar Palm Wrist 2.3 ?2.2 7 ?12 Ulnar Palm - Wrist 8          Median Palm - Ulnar Palm  0.6 ?0.4  L Median - Orthodromic (Dig II, Mid palm)     Dig II Wrist 3.7 ?3.4 6 ?10 Dig II - Wrist 13    R Median - Orthodromic (Dig II, Mid palm)     Dig II Wrist 4.1 ?3.4 5 ?10 Dig II - Wrist 13    L Ulnar - Orthodromic, (Dig V, Mid palm)     Dig V Wrist 3.1 ?3.1 3 ?5 Dig V - Wrist 11    R Ulnar - Orthodromic, (Dig V,  Mid palm)     Dig V Wrist 2.9 ?3.1 3 ?5 Dig V - Wrist 11  F  Wave    Nerve F Lat Ref.   ms ms  L Ulnar - ADM 29.3 ?32.0  R Ulnar - ADM 29.1 ?32.0

## 2019-04-16 ENCOUNTER — Ambulatory Visit (INDEPENDENT_AMBULATORY_CARE_PROVIDER_SITE_OTHER): Payer: Medicare Other | Admitting: *Deleted

## 2019-04-16 DIAGNOSIS — I63411 Cerebral infarction due to embolism of right middle cerebral artery: Secondary | ICD-10-CM | POA: Diagnosis not present

## 2019-04-16 LAB — CUP PACEART REMOTE DEVICE CHECK
Date Time Interrogation Session: 20210404041219
Implantable Pulse Generator Implant Date: 20181009

## 2019-04-17 ENCOUNTER — Ambulatory Visit: Payer: Medicare Other

## 2019-04-17 ENCOUNTER — Ambulatory Visit: Payer: Medicare Other | Admitting: Physical Therapy

## 2019-04-17 NOTE — Progress Notes (Signed)
ILR Remote 

## 2019-04-26 ENCOUNTER — Other Ambulatory Visit: Payer: Self-pay

## 2019-04-27 ENCOUNTER — Encounter: Payer: Self-pay | Admitting: Family Medicine

## 2019-04-27 ENCOUNTER — Ambulatory Visit (INDEPENDENT_AMBULATORY_CARE_PROVIDER_SITE_OTHER): Payer: Medicare Other | Admitting: Family Medicine

## 2019-04-27 VITALS — BP 132/80 | HR 81 | Temp 97.9°F | Ht 60.0 in | Wt 136.5 lb

## 2019-04-27 DIAGNOSIS — E1121 Type 2 diabetes mellitus with diabetic nephropathy: Secondary | ICD-10-CM

## 2019-04-27 DIAGNOSIS — I119 Hypertensive heart disease without heart failure: Secondary | ICD-10-CM

## 2019-04-27 DIAGNOSIS — N182 Chronic kidney disease, stage 2 (mild): Secondary | ICD-10-CM | POA: Diagnosis not present

## 2019-04-27 DIAGNOSIS — E785 Hyperlipidemia, unspecified: Secondary | ICD-10-CM

## 2019-04-27 LAB — LIPID PANEL
Cholesterol: 203 mg/dL — ABNORMAL HIGH (ref 0–200)
HDL: 65.1 mg/dL (ref >39.00)
LDL Cholesterol: 115 mg/dL — ABNORMAL HIGH (ref 0–99)
NonHDL: 137.75
Total CHOL/HDL Ratio: 3
Triglycerides: 114 mg/dL (ref 0.0–149.0)
VLDL: 22.8 mg/dL (ref 0.0–40.0)

## 2019-04-27 LAB — COMPREHENSIVE METABOLIC PANEL WITH GFR
ALT: 10 U/L (ref 0–35)
AST: 12 U/L (ref 0–37)
Albumin: 3.4 g/dL — ABNORMAL LOW (ref 3.5–5.2)
Alkaline Phosphatase: 86 U/L (ref 39–117)
BUN: 13 mg/dL (ref 6–23)
CO2: 26 meq/L (ref 19–32)
Calcium: 9.5 mg/dL (ref 8.4–10.5)
Chloride: 105 meq/L (ref 96–112)
Creatinine, Ser: 0.55 mg/dL (ref 0.40–1.20)
GFR: 131.56 mL/min
Glucose, Bld: 324 mg/dL — ABNORMAL HIGH (ref 70–99)
Potassium: 4.1 meq/L (ref 3.5–5.1)
Sodium: 137 meq/L (ref 135–145)
Total Bilirubin: 0.3 mg/dL (ref 0.2–1.2)
Total Protein: 6 g/dL (ref 6.0–8.3)

## 2019-04-27 LAB — POCT GLYCOSYLATED HEMOGLOBIN (HGB A1C): Hemoglobin A1C: 12.3 % — AB (ref 4.0–5.6)

## 2019-04-27 LAB — MICROALBUMIN / CREATININE URINE RATIO
Creatinine,U: 83.2 mg/dL
Microalb Creat Ratio: 116 mg/g — ABNORMAL HIGH (ref 0.0–30.0)
Microalb, Ur: 96.5 mg/dL — ABNORMAL HIGH (ref 0.0–1.9)

## 2019-04-27 MED ORDER — AMLODIPINE BESYLATE 10 MG PO TABS: 10.0000 mg | ORAL_TABLET | Freq: Every day | ORAL | 3 refills | Status: DC

## 2019-04-27 MED ORDER — INSULIN LISPRO 100 UNIT/ML ~~LOC~~ SOLN: SUBCUTANEOUS | 11 refills | Status: DC

## 2019-04-27 NOTE — Progress Notes (Signed)
HPI:  [name] is a 74 y.o. female, who is here today for chronic disease management.  She was last seen on 11/27/18. No new problems since her last visit.  She has been under stress, concerned about her 39 yo mother. She has had some health problems recently.  DM II:She is on Lantus 30 U bid, Victoza 1.8, and Metformin 500 mg bid. + Microalbuminuria. She has noted foam in urine. Negative for gross hematuria and decreased urine output.  She has not followed dietary recommendations, FBS's 200's. Drinking regular sodas. +Polyuria and polydipsia.  Lab Results  Component Value Date   HGBA1C 9.7 (A) 11/27/2018   HTN: She is on Amlodipine 10 mg daily and Losartan 50 mg daily. She is not checking BP regularly. Negative for severe/frequent headache, visual changes, chest pain, dyspnea, palpitation, new focal weakness, or edema.  Lab Results  Component Value Date   CREATININE 0.50 11/27/2018   BUN 10 11/27/2018   NA 142 11/27/2018   K 3.7 11/27/2018   CL 108 11/27/2018   CO2 27 11/27/2018   Lab Results  Component Value Date   MICROALBUR 326.6 (H) 11/27/2018   MICROALBUR 100.6 (H) 12/19/2017   HLD: She is on Atorvastatin 80 mg daily. CVA x 2. Doing PT and speech therapy.  Lab Results  Component Value Date   CHOL 152 02/09/2018   HDL 49 02/09/2018   LDLCALC 76 02/09/2018   TRIG 134 02/09/2018   CHOLHDL 3.1 02/09/2018   Review of Systems  Constitutional: Positive for fatigue. Negative for activity change, appetite change and fever.  HENT: Negative for mouth sores, nosebleeds and sore throat.   Eyes: Negative for pain and redness.  Respiratory: Negative for cough and wheezing.   Gastrointestinal: Negative for abdominal pain, nausea and vomiting.       Negative for changes in bowel habits.  Genitourinary: Negative for decreased urine volume, dysuria and hematuria.  Musculoskeletal: Negative for gait problem and myalgias.  Skin: Negative for rash  and wound.  Neurological: Negative for syncope and facial asymmetry.  Psychiatric/Behavioral: Negative for confusion. The patient is nervous/anxious.   Rest of ROS, see pertinent positives sand negatives in HPI  Current Outpatient Medications on File Prior to Visit  Medication Sig Dispense Refill  . acetaminophen (TYLENOL) 500 MG tablet Take 1,000 mg by mouth every 6 (six) hours as needed for mild pain or headache.    Marland Kitchen atorvastatin (LIPITOR) 80 MG tablet Take 1 tablet (80 mg total) by mouth daily at 6 PM. 90 tablet 2  . Blood Glucose Calibration (OT ULTRA/FASTTK CNTRL SOLN) SOLN     . calcium carbonate (TUMS EX) 750 MG chewable tablet Chew 1 tablet by mouth as needed for heartburn.    . clopidogrel (PLAVIX) 75 MG tablet TAKE 1 TABLET BY MOUTH DAILY 90 tablet 1  . diclofenac Sodium (VOLTAREN) 1 % GEL Apply 2 g topically 4 (four) times daily. 150 g 3  . famotidine (PEPCID) 40 MG tablet TAKE 1 TABLET(40 MG) BY MOUTH AT BEDTIME 90 tablet 3  . Insulin Glargine (LANTUS SOLOSTAR) 100 UNIT/ML Solostar Pen Inject 30 Units into the skin 2 (two) times daily. 15 mL 3  . Insulin Pen Needle (BD PEN NEEDLE NANO U/F) 32G X 4 MM MISC USE UP TO FOUR TIMES DAILY 200 each 2  . Lancet Devices (SIMPLE DIAGNOSTICS LANCING DEV) MISC Check BS before and 2 hours after meals. 200 each 6  . latanoprost (XALATAN) 0.005 % ophthalmic  solution Place 1 drop into both eyes at bedtime.    . liraglutide (VICTOZA) 18 MG/3ML SOPN Inject 0.3 mLs (1.8 mg total) into the skin daily. 9 mL 3  . losartan (COZAAR) 50 MG tablet TAKE 1 TABLET(50 MG) BY MOUTH DAILY 90 tablet 1  . metFORMIN (GLUCOPHAGE) 500 MG tablet Take 1 tablet (500 mg total) by mouth 2 (two) times daily with a meal. 180 tablet 1  . omeprazole (PRILOSEC) 20 MG capsule Take 1 capsule (20 mg total) by mouth daily. (Patient taking differently: Take 20 mg by mouth as needed. ) 30 capsule 3  . ONE TOUCH ULTRA TEST test strip Check BS before and 2 hours after meals. 200 each 6    . PHARMACIST CHOICE ALCOHOL 70 % PADS     . ROCKLATAN 0.02-0.005 % SOLN Place 1 drop into both eyes every evening.    . Vitamin D, Ergocalciferol, (DRISDOL) 1.25 MG (50000 UNIT) CAPS capsule TAKE ONE CAPSULE BY MOUTH TWICE A WEEK( MONDAY AND THURSDAY) 8 capsule 0   No current facility-administered medications on file prior to visit.     Past Medical History:  Diagnosis Date  . Bilateral carpal tunnel syndrome 03/27/2019  . Boils   . Glaucoma   . Heart murmur   . HTN (hypertension)   . Hx of adenomatous colonic polyps   . Hyperlipidemia   . Osteoporosis   . Pneumonia   . Stroke ([place])   . Type II or unspecified type diabetes mellitus without mention of complication, uncontrolled    No Known Allergies  Social History   Socioeconomic History  . Marital status: Single    Spouse name: Not on file  . Number of children: Not on file  . Years of education: Not on file  . Highest education level: Not on file  Occupational History  . Not on file  Tobacco Use  . Smoking status: Former Smoker    Types: Cigarettes    Quit date: 10/15/2016    Years since quitting: 2.5  . Smokeless tobacco: Never Used  . Tobacco comment: smokes occ.   Substance and Sexual Activity  . Alcohol use: Yes    Alcohol/week: 0.0 standard drinks    Comment: occ  . Drug use: No  . Sexual activity: Not on file  Other Topics Concern  . Not on file  Social History Narrative  . Not on file   Social Determinants of Health   Financial Resource Strain:   . Difficulty of Paying Living Expenses:   Food Insecurity:   . Worried About Charity fundraiser in the Last Year:   . Arboriculturist in the Last Year:   Transportation Needs:   . Film/video editor (Medical):   Marland Kitchen Lack of Transportation (Non-Medical):   Physical Activity:   . Days of Exercise per Week:   . Minutes of Exercise per Session:   Stress:   . Feeling of Stress :   Social Connections:   . Frequency of Communication with Friends and  Family:   . Frequency of Social Gatherings with Friends and Family:   . Attends Religious Services:   . Active Member of Clubs or Organizations:   . Attends Archivist Meetings:   Marland Kitchen Marital Status:     Vitals:   04/27/19 0954  BP: 132/80  Pulse: 81  Temp: 97.9 F (36.6 C)  SpO2: 97%   Wt Readings from Last 3 Encounters:  04/27/19 136 lb 8 oz (61.9  kg)  02/13/19 141 lb 9.6 oz (64.2 kg)  11/27/18 143 lb 9.6 oz (65.1 kg)    Body mass index is 26.66 kg/m.   Physical Exam  Nursing note and vitals reviewed. Constitutional: She is oriented to person, place, and time. She appears well-developed. No distress.  HENT:  Head: Normocephalic and atraumatic.  Mouth/Throat: Oropharynx is clear and moist and mucous membranes are normal.  Eyes: Pupils are equal, round, and reactive to light. Conjunctivae are normal.  Cardiovascular: Normal rate and regular rhythm.  No murmur heard. Pulses:      Dorsalis pedis pulses are 2+ on the right side and 2+ on the left side.  Respiratory: Effort normal and breath sounds normal. No respiratory distress.  GI: Soft. She exhibits no mass. There is no hepatomegaly. There is no abdominal tenderness.  Musculoskeletal:        General: No edema.  Lymphadenopathy:    She has no cervical adenopathy.  Neurological: She is alert and oriented to person, place, and time. No cranial nerve deficit.  No focal deficit appreciated. Mildly unstable gait,has no assistance.   Skin: Skin is warm. No rash noted. No erythema.  Psychiatric: Her mood appears anxious.  Looks tired, appropriately groomed, good eye contact.    ASSESSMENT AND PLAN:   Ms. MUSA ROBITAILLE was seen today for chronic disease management.  Orders Placed This Encounter  Procedures  . Microalbumin / creatinine urine ratio  . Lipid panel  . Comprehensive metabolic panel  . POC HgB A1c   Lab Results  Component Value Date   HGBA1C 12.3 (A) 04/27/2019   Lab Results    Component Value Date   CHOL 203 (H) 04/27/2019   HDL 65.10 04/27/2019   LDLCALC 115 (H) 04/27/2019   TRIG 114.0 04/27/2019   CHOLHDL 3 04/27/2019    Lab Results  Component Value Date   MICROALBUR 96.5 (H) 04/27/2019   MICROALBUR 326.6 (H) 11/27/2018   Lab Results  Component Value Date   CREATININE 0.55 04/27/2019   BUN 13 04/27/2019   NA 137 04/27/2019   K 4.1 04/27/2019   CL 105 04/27/2019   CO2 26 04/27/2019   Lab Results  Component Value Date   ALT 10 04/27/2019   AST 12 04/27/2019   ALKPHOS 86 04/27/2019   BILITOT 0.3 04/27/2019    Hypertension with heart disease Problem is adequately controlled. Because she is having difficulty splitting amlodipine tablet, she will increase dose from 7.5 mg to 10 mg daily. Continue losartan 50 mg daily. Recommend monitoring BP at home. Continue low-salt diet.  Type II diabetes mellitus with nephropathy ([place]) Problem is not well controlled. Recommend increasing Lantus a.m. dose from 30 units to 35 units. Continue Lantus 30 units at bedtime. Humulin added today, 5 to 10 units before meals. No changes in Metformin and Victoza. We discussed possible complications of poorly controlled glucose.  Regular exercise and healthy diet with avoidance of added sugar food intake is an important part of treatment and recommended. Annual eye exam, periodic dental and foot care recommended. F/U in 3-4 months   Hyperlipidemia LDL goal < 70. Continue atorvastatin 80 mg daily. Low-fat diet also recommended. Further recommendation will be given according to lipid panel results.   CKD (chronic kidney disease), stage II We discussed renal complications of poorly controlled diabetes. Explained that glucose must be better control to prevent/slow progression. Continue losartan 50 mg daily. Low-salt diet. Avoid NSAIDs. Adequate hydration.     Return in about 4  months (around 08/27/2019).   Lacharles Altschuler G. [name], MD  [facility]. [place] office.

## 2019-04-27 NOTE — Assessment & Plan Note (Signed)
We discussed renal complications of poorly controlled diabetes. Explained that glucose must be better control to prevent/slow progression. Continue losartan 50 mg daily. Low-salt diet. Avoid NSAIDs. Adequate hydration.

## 2019-04-27 NOTE — Patient Instructions (Addendum)
A few things to remember from today's visit:  Humalog added today: 5-10 U 15 min before eating meals 3 times per day. Lantus morning dose increased to 35 U. Amlodipine increased to 10 mg,so you do not have to cut tab.  Check sugar before and 2 hours after meals.   Diabetes Mellitus and Nutrition, Adult When you have diabetes (diabetes mellitus), it is very important to have healthy eating habits because your blood sugar (glucose) levels are greatly affected by what you eat and drink. Eating healthy foods in the appropriate amounts, at about the same times every day, can help you:  Control your blood glucose.  Lower your risk of heart disease.  Improve your blood pressure.  Reach or maintain a healthy weight. Every person with diabetes is different, and each person has different needs for a meal plan. Your health care provider may recommend that you work with a diet and nutrition specialist (dietitian) to make a meal plan that is best for you. Your meal plan may vary depending on factors such as:  The calories you need.  The medicines you take.  Your weight.  Your blood glucose, blood pressure, and cholesterol levels.  Your activity level.  Other health conditions you have, such as heart or kidney disease. How do carbohydrates affect me? Carbohydrates, also called carbs, affect your blood glucose level more than any other type of food. Eating carbs naturally raises the amount of glucose in your blood. Carb counting is a method for keeping track of how many carbs you eat. Counting carbs is important to keep your blood glucose at a healthy level, especially if you use insulin or take certain oral diabetes medicines. It is important to know how many carbs you can safely have in each meal. This is different for every person. Your dietitian can help you calculate how many carbs you should have at each meal and for each snack. Foods that contain carbs include:  Bread, cereal, rice,  pasta, and crackers.  Potatoes and corn.  Peas, beans, and lentils.  Milk and yogurt.  Fruit and juice.  Desserts, such as cakes, cookies, ice cream, and candy. How does alcohol affect me? Alcohol can cause a sudden decrease in blood glucose (hypoglycemia), especially if you use insulin or take certain oral diabetes medicines. Hypoglycemia can be a life-threatening condition. Symptoms of hypoglycemia (sleepiness, dizziness, and confusion) are similar to symptoms of having too much alcohol. If your health care provider says that alcohol is safe for you, follow these guidelines:  Limit alcohol intake to no more than 1 drink per day for nonpregnant women and 2 drinks per day for men. One drink equals 12 oz of beer, 5 oz of wine, or 1 oz of hard liquor.  Do not drink on an empty stomach.  Keep yourself hydrated with water, diet soda, or unsweetened iced tea.  Keep in mind that regular soda, juice, and other mixers may contain a lot of sugar and must be counted as carbs. What are tips for following this plan?  Reading food labels  Start by checking the serving size on the "Nutrition Facts" label of packaged foods and drinks. The amount of calories, carbs, fats, and other nutrients listed on the label is based on one serving of the item. Many items contain more than one serving per package.  Check the total grams (g) of carbs in one serving. You can calculate the number of servings of carbs in one serving by dividing the total carbs  by 15. For example, if a food has 30 g of total carbs, it would be equal to 2 servings of carbs.  Check the number of grams (g) of saturated and trans fats in one serving. Choose foods that have low or no amount of these fats.  Check the number of milligrams (mg) of salt (sodium) in one serving. Most people should limit total sodium intake to less than 2,300 mg per day.  Always check the nutrition information of foods labeled as "low-fat" or "nonfat". These  foods may be higher in added sugar or refined carbs and should be avoided.  Talk to your dietitian to identify your daily goals for nutrients listed on the label. Shopping  Avoid buying canned, premade, or processed foods. These foods tend to be high in fat, sodium, and added sugar.  Shop around the outside edge of the grocery store. This includes fresh fruits and vegetables, bulk grains, fresh meats, and fresh dairy. Cooking  Use low-heat cooking methods, such as baking, instead of high-heat cooking methods like deep frying.  Cook using healthy oils, such as olive, canola, or sunflower oil.  Avoid cooking with butter, cream, or high-fat meats. Meal planning  Eat meals and snacks regularly, preferably at the same times every day. Avoid going long periods of time without eating.  Eat foods high in fiber, such as fresh fruits, vegetables, beans, and whole grains. Talk to your dietitian about how many servings of carbs you can eat at each meal.  Eat 4-6 ounces (oz) of lean protein each day, such as lean meat, chicken, fish, eggs, or tofu. One oz of lean protein is equal to: ? 1 oz of meat, chicken, or fish. ? 1 egg. ?  cup of tofu.  Eat some foods each day that contain healthy fats, such as avocado, nuts, seeds, and fish. Lifestyle  Check your blood glucose regularly.  Exercise regularly as told by your health care provider. This may include: ? 150 minutes of moderate-intensity or vigorous-intensity exercise each week. This could be brisk walking, biking, or water aerobics. ? Stretching and doing strength exercises, such as yoga or weightlifting, at least 2 times a week.  Take medicines as told by your health care provider.  Do not use any products that contain nicotine or tobacco, such as cigarettes and e-cigarettes. If you need help quitting, ask your health care provider.  Work with a Social worker or diabetes educator to identify strategies to manage stress and any emotional and  social challenges. Questions to ask a health care provider  Do I need to meet with a diabetes educator?  Do I need to meet with a dietitian?  What number can I call if I have questions?  When are the best times to check my blood glucose? Where to find more information:  American Diabetes Association: diabetes.org  Academy of Nutrition and Dietetics: www.eatright.[employer] of Diabetes and Digestive and Kidney Diseases (NIH): [employer] Summary  A healthy meal plan will help you control your blood glucose and maintain a healthy lifestyle.  Working with a diet and nutrition specialist (dietitian) can help you make a meal plan that is best for you.  Keep in mind that carbohydrates (carbs) and alcohol have immediate effects on your blood glucose levels. It is important to count carbs and to use alcohol carefully. This information is not intended to replace advice given to you by your health care provider. Make sure you discuss any questions you have with your health care  provider. Document Revised: 12/10/2016 Document Reviewed: 02/02/2016 Elsevier Patient Education  [place].  Please be sure medication list is accurate. If a new problem present, please set up appointment sooner than planned today.

## 2019-04-27 NOTE — Assessment & Plan Note (Signed)
Problem is not well controlled. Recommend increasing Lantus a.m. dose from 30 units to 35 units. Continue Lantus 30 units at bedtime. Humulin added today, 5 to 10 units before meals. No changes in Metformin and Victoza. We discussed possible complications of poorly controlled glucose.  Regular exercise and healthy diet with avoidance of added sugar food intake is an important part of treatment and recommended. Annual eye exam, periodic dental and foot care recommended. F/U in 3-4 months

## 2019-04-27 NOTE — Assessment & Plan Note (Signed)
Problem is adequately controlled. Because she is having difficulty splitting amlodipine tablet, she will increase dose from 7.5 mg to 10 mg daily. Continue losartan 50 mg daily. Recommend monitoring BP at home. Continue low-salt diet.

## 2019-04-27 NOTE — Assessment & Plan Note (Signed)
LDL goal < 70. Continue atorvastatin 80 mg daily. Low-fat diet also recommended. Further recommendation will be given according to lipid panel results.

## 2019-04-30 ENCOUNTER — Other Ambulatory Visit: Payer: Self-pay | Admitting: *Deleted

## 2019-04-30 MED ORDER — EZETIMIBE 10 MG PO TABS: 10.0000 mg | ORAL_TABLET | Freq: Every day | ORAL | 3 refills | Status: DC

## 2019-05-08 ENCOUNTER — Telehealth: Payer: Self-pay | Admitting: Family Medicine

## 2019-05-08 NOTE — Telephone Encounter (Signed)
Erica with [employer], is calling to give an update on pt's A1C. Pt's A1C today is 12.1 +3 protein in urine. Thanks  [employer] [phone]

## 2019-05-08 NOTE — Telephone Encounter (Signed)
Message Routed to PCP for review. 

## 2019-05-08 NOTE — Telephone Encounter (Signed)
She was not due for A1c. DM2 has been poorly controlled. Also history of microalbuminuria/proteinuria. Treatment was adjusted last visit.  Lab Results  Component Value Date   HGBA1C 12.3 (A) 04/27/2019   Thanks, BJ

## 2019-05-09 NOTE — Telephone Encounter (Signed)
Noted  

## 2019-05-10 ENCOUNTER — Other Ambulatory Visit: Payer: Self-pay

## 2019-05-10 ENCOUNTER — Telehealth: Payer: Self-pay | Admitting: Family Medicine

## 2019-05-10 ENCOUNTER — Ambulatory Visit: Payer: Medicare Other | Attending: Adult Health

## 2019-05-10 ENCOUNTER — Ambulatory Visit: Payer: Medicare Other

## 2019-05-10 DIAGNOSIS — R482 Apraxia: Secondary | ICD-10-CM

## 2019-05-10 DIAGNOSIS — M6281 Muscle weakness (generalized): Secondary | ICD-10-CM | POA: Diagnosis not present

## 2019-05-10 DIAGNOSIS — R2681 Unsteadiness on feet: Secondary | ICD-10-CM | POA: Diagnosis not present

## 2019-05-10 DIAGNOSIS — R2689 Other abnormalities of gait and mobility: Secondary | ICD-10-CM | POA: Insufficient documentation

## 2019-05-10 NOTE — Telephone Encounter (Signed)
Pt advised she can use the syringe for her insulin, she would need the pens. She is requesting pens be ordered as soon as possible because she has not taken it for 2 days. Advised that Dr. [name] is out of the office today and will be in tomorrow.

## 2019-05-10 NOTE — Patient Instructions (Signed)
  Speech Exercises  Repeat 3 times, 2 times a day  Speak SLOWLY, OPEN your mouth!  Call the cat "Buttercup" A calendar of [place], [place] Four floors to cover Yellow oil ointment Fellow lovers of felines Catastrophe in [place]' plums Sheep should sleep soundly She sees cheese Zebras zig and zebras zag Four fine fresh fish for you [place] grapes, [place] grapes, Greek grapes The church's chimes chimed Telling time 'til eleven Five valve levers Keep the gate closed Go see that guy Fat cows give milk [employer] Gophers Fat frogs flip freely [place] into bed He threw three balls Get that game to [employer] Thick thistles stick together Cinnamon aluminum linoleum Black bugs blood Lovely lemon linament Red leather, yellow leather The big bug bit the little beetle Friendly fleas and fireflies  Big grocery buggy    Purple baby carriage [place] copper coffee pot Ripe purple cabbage Three free throws Red lorry, yellow lorry [employer] tackled  [employer] dipped the dessert  [place] Murphys that [employer] of [employer] Shirts shrink, shells shouldn't [place] 49ers Take the tackle box File the flash message Give me five flapjacks Fundamental relatives Dye the pets purple Talking [place] time after time Dark chocolate chunks Six sticky skeletons Green glass globes glow greenly The blue bluebird blinks Political landscape of the kingdom Estate manager/land agent genius We played yo-yos yesterday  ===========================================  Read something out loud for 45 seconds, 8-10 times, per day Focus on SLOWER speech and OPENING your mouth as you read

## 2019-05-10 NOTE — Chronic Care Management (AMB) (Signed)
  Chronic Care Management   Note  05/10/2019 Name: SKYY SCHRAEDER MRN: [id number] DOB: [birth year]  MADDIE FOURNIER is a 75 y.o. year old female who is a primary care patient of [name], Malka So, MD. I reached out to [name] by phone today in response to a referral sent by Ms. Deliah Boston Mcpeek's PCP, [name], [name], MD.   Ms. Gasaway was given information about Chronic Care Management services today including:  1. CCM service includes personalized support from designated clinical staff supervised by her physician, including individualized plan of care and coordination with other care providers 2. 24/7 contact phone numbers for assistance for urgent and routine care needs. 3. Service will only be billed when office clinical staff spend 20 minutes or more in a month to coordinate care. 4. Only one practitioner may furnish and bill the service in a calendar month. 5. The patient may stop CCM services at any time (effective at the end of the month) by phone call to the office staff.   Patient agreed to services and verbal consent obtained.   Follow up plan:   [place]

## 2019-05-10 NOTE — Chronic Care Management (AMB) (Signed)
  Chronic Care Management   Outreach Note  05/10/2019 Name: Sarah [name] MRN: [id number] DOB: [birth year]  Referred by: [name], [name], MD Reason for referral : Chronic Care Management   An unsuccessful telephone outreach was attempted today. The patient was referred to the pharmacist for assistance with care management and care coordination.   Follow Up Plan:   [place]

## 2019-05-10 NOTE — Therapy (Signed)
[facility] [address] [place], [place], [address] Phone: [phone]   Fax:  [phone]  Speech Language Pathology Evaluation  Patient Details  Name: Sarah [name] MRN: [id number] Date of Birth: 05-19-1945 Referring Provider (SLP): Frann Rider, NP   Encounter Date: 05/10/2019  End of Session - 05/10/19 1029    Visit Number  1    Number of Visits  17    Date for SLP Re-Evaluation  08/08/19   90 days   SLP Start Time  0935    SLP Stop Time   1015    SLP Time Calculation (min)  40 min    Activity Tolerance  Patient tolerated treatment well       Past Medical History:  Diagnosis Date  . Bilateral carpal tunnel syndrome 03/27/2019  . Boils   . Glaucoma   . Heart murmur   . HTN (hypertension)   . Hx of adenomatous colonic polyps   . Hyperlipidemia   . Osteoporosis   . Pneumonia   . Stroke ([place])   . Type II or unspecified type diabetes mellitus without mention of complication, uncontrolled     Past Surgical History:  Procedure Laterality Date  . ABDOMINAL HYSTERECTOMY    . CARPAL TUNNEL RELEASE     right  . COLONOSCOPY  12-10-10   per Dr. Deatra Ina, clear, repeat in 7 yrs   . INCISION AND DRAINAGE PERIRECTAL ABSCESS N/A 09/26/2015   Procedure: IRRIGATION AND DEBRIDEMENT PERIRECTAL ABSCESS;  Surgeon: Mickeal Skinner, MD;  Location: [facility];  Service: General;  Laterality: N/A;  . KNEE ARTHROSCOPY     right  . LOOP RECORDER INSERTION N/A 10/19/2016   Procedure: LOOP RECORDER INSERTION;  Surgeon: Thompson Grayer, MD;  Location: [facility];  Service: Cardiovascular;  Laterality: N/A;  . ORIF ANKLE FRACTURE Right 03/24/2012   Procedure: OPEN REDUCTION INTERNAL FIXATION (ORIF) ANKLE FRACTURE;  Surgeon: Newt Minion, MD;  Location: [facility];  Service: Orthopedics;  Laterality: Right;  Open Reduction Internal Fixation Right Bimalleolar ankle fracture  . POLYPECTOMY    . TEE WITHOUT CARDIOVERSION N/A  10/18/2016   Procedure: TRANSESOPHAGEAL ECHOCARDIOGRAM (TEE);  Surgeon: Fay Records, MD;  Location: [facility];  Service: Cardiovascular;  Laterality: N/A;  . TONSILLECTOMY      There were no vitals filed for this visit.  Subjective Assessment - 05/10/19 0941    Subjective  "Sometimes it feels like all the words want to come out at the same time."    Currently in Pain?  No/denies         SLP Evaluation [facility] - 05/10/19 0941      SLP Visit Information   SLP Received On  05/10/19    Referring Provider (SLP)  Frann Rider, NP    Onset Date  "A couple months"    Medical Diagnosis  CVA      Subjective   Patient/Family Stated Goal  "Make my speech better."      General Information   HPI  Pt well known to ST from previous ST course in late 2018 and early 2019 for word finding. Pt with good-excellent success with her therapy course. Was referred to ST for another eval after another CVA in January 2020 but pt felt she didn't need any ST at that time. In the past 2 months pts speech intermittently feels like it is "all coming out at once."    Mobility Status  Balance issues - PT eval  tomorrow      Prior Functional Status   Cognitive/Linguistic Baseline  Within functional limits      Cognition   Overall Cognitive Status  Within Functional Limits for tasks assessed      Auditory Comprehension   Overall Auditory Comprehension  Appears within functional limits for tasks assessed      Verbal Expression   Overall Verbal Expression  Appears within functional limits for tasks assessed      Oral Motor/Sensory Function   Overall Oral Motor/Sensory Function  Impaired    Labial ROM  Within Functional Limits    Labial Strength  Within Functional Limits    Labial Coordination  Reduced    Lingual ROM  Within Functional Limits    Lingual Strength  Within Functional Limits    Lingual Coordination  Reduced    Overall Oral Motor/Sensory Function  Pt with WNL oral strength however decr'd  coordination of oral musculature.       Motor Speech   Overall Motor Speech  Impaired    Articulation  Impaired    Level of Impairment  Phrase    Intelligibility  Intelligible    Motor Planning  Impaired    Level of Impairment  Phrase    Motor Speech Errors  Aware;Inconsistent   halting, hestiations in conversation   Effective Techniques  Slow rate                      SLP Education - 05/10/19 1028    Education Details  HEP procedure, need to speak slower and overarticulate    Person(s) Educated  Patient    Methods  Explanation;Demonstration;Verbal cues    Comprehension  Verbalized understanding;Returned demonstration;Verbal cues required;Need further instruction       SLP Short Term Goals - 05/10/19 1032      SLP SHORT TERM GOAL #1   Title  pt will perform HEP with occasional min A x2 sessions    Time  4    Period  Weeks    Status  New      SLP SHORT TERM GOAL #2   Title  pt will engage in 5 minutes simple converstion with modified independence    Time  4    Period  Weeks    Status  New      SLP SHORT TERM GOAL #3   Title  pt will demo compensations for speech with 18/20 sentences x2 sessions    Time  4    Period  Weeks    Status  New       SLP Long Term Goals - 05/10/19 1034      SLP LONG TERM GOAL #1   Title  pt will engage in 10 minutes WFL simple-mod complex conversation with modified indpeendence (compensations) x2 sessions    Time  8    Period  Weeks    Status  New      SLP LONG TERM GOAL #2   Title  Pt will perform HEP with modified independence x3 sessions    Time  8    Period  Weeks    Status  New       Plan - 05/10/19 [id number]    Clinical Impression Statement  Pt presents today with what appears to be mild verbal apraxia developing over the last 2 months. Her speech is best characterized by slight hesitancy and pausing in both reading and in spontaneous conversation. Oral-motor testing reveals slight incoordination of labial and  lingual musculature with WNL/WFL strength. She reports rarely-occasionally people are asking her to repeat. She would like to take a more conservative approach to speech therapy (ST) and have ST once/week and then incr frequency should she require. SLP, however, recommends x2/week and plan will be written for x2/week x8 weeks in case pt requires greater frequency than her desired x1/week. Pt was provided some material today to practice routinely at home. Skiled ST would be beneficial for the pt in order to make communication more efficient and easy for pt.    Speech Therapy Frequency  2x / week    Duration  --   x8 weeks - or 17 visist total      Patient will benefit from skilled therapeutic intervention in order to improve the following deficits and impairments:   Verbal apraxia - Plan: SLP plan of care cert/re-cert    Problem List Patient Active Problem List   Diagnosis Date Noted  . Bilateral carpal tunnel syndrome 03/27/2019  . Osteoarthritis of both knees 11/27/2018  . CKD (chronic kidney disease), stage II 11/27/2018  . GERD (gastroesophageal reflux disease) 06/26/2018  . Homonymous hemianopia, left 02/08/2018  . HTN (hypertension)   . Trigger finger, right index finger 10/17/2017  . Low back pain 07/29/2017  . Carotid artery stenosis 11/02/2016  . Left arm weakness   . Diastolic dysfunction   . Hypertension with heart disease   . Tobacco abuse   . Acute blood loss anemia   . Cerebral infarction due to embolism of right middle cerebral artery ([place])   . Perirectal abscess 09/26/2015  . Vitamin D deficiency 07/23/2015  . Osteopenia 02/16/2013  . Ankle fracture 01/19/2013  . Anemia 09/27/2011  . Type II diabetes mellitus with nephropathy ([place]) 09/24/2011  . Hyperlipidemia 09/24/2011    [facility] ,[place], [place]  05/10/2019, 10:55 AM  [facility] [address] [place], [place], [address] Phone: [phone]    Fax:  [phone]  Name: Sarah [name] MRN: [id number] Date of Birth: [birth year]

## 2019-05-10 NOTE — Telephone Encounter (Signed)
Called pt for clarification but no answer or vm.

## 2019-05-11 ENCOUNTER — Ambulatory Visit: Payer: Medicare Other | Admitting: Physical Therapy

## 2019-05-11 ENCOUNTER — Other Ambulatory Visit: Payer: Self-pay | Admitting: *Deleted

## 2019-05-11 ENCOUNTER — Encounter: Payer: Self-pay | Admitting: Physical Therapy

## 2019-05-11 DIAGNOSIS — R482 Apraxia: Secondary | ICD-10-CM | POA: Diagnosis not present

## 2019-05-11 DIAGNOSIS — M6281 Muscle weakness (generalized): Secondary | ICD-10-CM

## 2019-05-11 DIAGNOSIS — R2681 Unsteadiness on feet: Secondary | ICD-10-CM

## 2019-05-11 DIAGNOSIS — E1121 Type 2 diabetes mellitus with diabetic nephropathy: Secondary | ICD-10-CM

## 2019-05-11 DIAGNOSIS — R2689 Other abnormalities of gait and mobility: Secondary | ICD-10-CM | POA: Diagnosis not present

## 2019-05-11 MED ORDER — BD PEN NEEDLE NANO U/F 32G X 4 MM MISC: 2 refills | Status: DC

## 2019-05-11 NOTE — Telephone Encounter (Signed)
Pen needles sent to the pharmacy. LVM for patient to return call to office.

## 2019-05-11 NOTE — Therapy (Signed)
Sarah [address] [name], [place], [address] Phone: [phone]   Fax:  [phone]  Physical Therapy Evaluation  Patient Details  Name: Sarah [name] MRN: [id number] Date of Birth: May 22, 1945 Referring Provider (PT): Frann Rider, NP   Encounter Date: 05/11/2019  PT End of Session - 05/11/19 1450    Visit Number  1    Number of Visits  13    Date for PT Re-Evaluation  08/09/19   for 6 week POC, 90 day [employer] - 10th visit PN    Progress Note Due on Visit  10    PT Start Time  1103    PT Stop Time  1145    PT Time Calculation (min)  42 min    Activity Tolerance  Patient tolerated treatment well    Behavior During Therapy  [facility] for tasks assessed/performed       Past Medical History:  Diagnosis Date  . Bilateral carpal tunnel syndrome 03/27/2019  . Boils   . Glaucoma   . Heart murmur   . HTN (hypertension)   . Hx of adenomatous colonic polyps   . Hyperlipidemia   . Osteoporosis   . Pneumonia   . Stroke ([place])   . Type II or unspecified type diabetes mellitus without mention of complication, uncontrolled     Past Surgical History:  Procedure Laterality Date  . ABDOMINAL HYSTERECTOMY    . CARPAL TUNNEL RELEASE     right  . COLONOSCOPY  12-10-10   per Dr. Deatra Ina, clear, repeat in 7 yrs   . INCISION AND DRAINAGE PERIRECTAL ABSCESS N/A 09/26/2015   Procedure: IRRIGATION AND DEBRIDEMENT PERIRECTAL ABSCESS;  Surgeon: Mickeal Skinner, MD;  Location: [facility];  Service: General;  Laterality: N/A;  . KNEE ARTHROSCOPY     right  . LOOP RECORDER INSERTION N/A 10/19/2016   Procedure: LOOP RECORDER INSERTION;  Surgeon: Thompson Grayer, MD;  Location: [facility];  Service: Cardiovascular;  Laterality: N/A;  . ORIF ANKLE FRACTURE Right 03/24/2012   Procedure: OPEN REDUCTION INTERNAL FIXATION (ORIF) ANKLE FRACTURE;  Surgeon: Newt Minion, MD;  Location: [facility];   Service: Orthopedics;  Laterality: Right;  Open Reduction Internal Fixation Right Bimalleolar ankle fracture  . POLYPECTOMY    . TEE WITHOUT CARDIOVERSION N/A 10/18/2016   Procedure: TRANSESOPHAGEAL ECHOCARDIOGRAM (TEE);  Surgeon: Fay Records, MD;  Location: [facility] ENDOSCOPY;  Service: Cardiovascular;  Laterality: N/A;  . TONSILLECTOMY      There were no vitals filed for this visit.   Subjective Assessment - 05/11/19 1107    Subjective  Feel like lately I've been more unbalanced.  Don't think that ever went away since the second CVA, but it's worse.  Haven't actually fallen, but I've lost my balance and caught myself.  Most of the time, just walking, and feel wobbly.  The left side was weaker from the previous strokes.  Don't use assistive device.    Pertinent History  DM2, CVA, HTN, osteoporosis, glaucoma, L visual field deficits    Patient Stated Goals  Pt's goal for therapy is to get stronger and be able to walk without feeling unbalanced.    Currently in Pain?  No/denies         [facility] PT Assessment - 05/11/19 1111      Assessment   Medical Diagnosis  Imbalance due to history of CVA    Referring Provider (PT)  Frann Rider, NP  Onset Date/Surgical Date  03/12/19   MD visit   Hand Dominance  Right    Prior Therapy  OP PT 2018-2019, spring-summer 2020      Precautions   Precautions  Fall      Balance Screen   Has the patient fallen in the past 6 months  No    Has the patient had a decrease in activity level because of a fear of falling?   No   More cautious   Is the patient reluctant to leave their home because of a fear of falling?   No      Home Environment   Living Environment  Private residence    Living Arrangements  Parent   Mom is 73 yo and she is caregiver   Available Help at Discharge  Family    Type of [place] Access  Level entry    Entrance Stairs-Number of Steps       Home Layout  Two level    Alternate Level Stairs-Number of Steps  15     Alternate Level Stairs-Rails  Right;Left   one side, then the other   [place] - 4 wheels;Grab bars - tub/shower    Additional Comments  manages mothers medication      Prior Function   Level of Independence  Independent with basic ADLs;Independent with homemaking with ambulation;Independent with household mobility with device    Vocation  Retired    Leisure  Cares for 22 yo mother, enjoys walking outside; shopping, bowling      Cognition   Overall Cognitive Status  Within Functional Limits for tasks assessed      Observation/Other Assessments   Focus on Therapeutic Outcomes (FOTO)   NA      Posture/Postural Control   Posture/Postural Control  Postural limitations    Postural Limitations  Rounded Shoulders;Forward head      ROM / Strength   AROM / PROM / Strength  Strength      Strength   Overall Strength  Deficits    Overall Strength Comments  Grossly tested 4/5 hip flexion, quads, hamstrings; ankle dorsiflexion 4+/5    Strength Assessment Site  --    Right/Left Hip  --      Transfers   Transfers  Sit to Stand;Stand to Sit    Sit to Stand  6: Modified independent (Device/Increase time);Without upper extremity assist;From chair/3-in-1    Five time sit to stand comments   17.40    Stand to Sit  6: Modified independent (Device/Increase time);Without upper extremity assist;To chair/3-in-1      Ambulation/Gait   Ambulation/Gait  Yes    Ambulation/Gait Assistance  5: Supervision    Ambulation Distance (Feet)  200 Feet    Assistive device  None    Gait Pattern  Step-through pattern;Decreased trunk rotation;Wide base of support    Ambulation Surface  Level;Indoor    Gait velocity  3.13 ft/sec      Standardized Balance Assessment   Standardized Balance Assessment  Timed Up and Go Test      Timed Up and Go Test   TUG  Normal TUG    Normal TUG (seconds)  11.28    TUG Comments  Scores >13.5 sec indicates increased fall risk.      High Level Balance   High Level  Balance Comments  Pt stands EO and EC 30 sec solid surface; on cushion, pt stands 30 sec EO with increased sway;  9 sec with EC prior to LOB.  Indicates decreased vestibular system use for balance.      Functional Gait  Assessment   Gait assessed   Yes    Gait Level Surface  Walks 20 ft in less than 7 sec but greater than 5.5 sec, uses assistive device, slower speed, mild gait deviations, or deviates 6-10 in outside of the 12 in walkway width.   6.4   Change in Gait Speed  Able to smoothly change walking speed without loss of balance or gait deviation. Deviate no more than 6 in outside of the 12 in walkway width.    Gait with Horizontal Head Turns  Performs head turns smoothly with slight change in gait velocity (eg, minor disruption to smooth gait path), deviates 6-10 in outside 12 in walkway width, or uses an assistive device.    Gait with Vertical Head Turns  Performs task with slight change in gait velocity (eg, minor disruption to smooth gait path), deviates 6 - 10 in outside 12 in walkway width or uses assistive device    Gait and Pivot Turn  Pivot turns safely in greater than 3 sec and stops with no loss of balance, or pivot turns safely within 3 sec and stops with mild imbalance, requires small steps to catch balance.   wide BOS, extra steps   Step Over Obstacle  Is able to step over one shoe box (4.5 in total height) but must slow down and adjust steps to clear box safely. May require verbal cueing.    Gait with Narrow Base of Support  Ambulates less than 4 steps heel to toe or cannot perform without assistance.   2   Gait with Eyes Closed  Walks 20 ft, slow speed, abnormal gait pattern, evidence for imbalance, deviates 10-15 in outside 12 in walkway width. Requires more than 9 sec to ambulate 20 ft.   9.91   Ambulating Backwards  Walks 20 ft, uses assistive device, slower speed, mild gait deviations, deviates 6-10 in outside 12 in walkway width.   20   Steps  Alternating feet, must use  rail.    Total Score  17    FGA comment:  Scores <22/30 indicate increased fall risk; decline in FGA form 28/30 from 07/2018 discharge                Objective measurements completed on examination: See above findings.           Self Care:   PT Education - 05/11/19 1448    Education Details  Educated in eval results, POC; compared results from PT d/c 07/2018; discussed likely decreased vestibular system use for balance; fall risk per balance measures.  Discussed safety in real-life situations (ie-outdoors, darkness), with need for good lighting indoors and out    Person(s) Educated  Patient    Methods  Explanation    Comprehension  Verbalized understanding       PT Short Term Goals - 05/11/19 1456      PT SHORT TERM GOAL #1   Title  Pt will be independent with HEP for improved strength, balance, transfers, and gait.  TARGET 06/08/2019    Time  5    Period  Weeks    Status  New      PT SHORT TERM GOAL #2   Title  Pt will improve 5x sit<>stand to less than or equal to 14 sec to demonstrate improved functional strength and transfer efficiency.    Baseline  17.40 sec at eval    Time  5    Period  Weeks    Status  New      PT SHORT TERM GOAL #3   Title  Patient to improve FGA to >/= 22/30 demonstrating improved dynamic balance and decreased fall risk    Baseline  17/30 at eval    Time  5    Period  Weeks    Status  New      PT SHORT TERM GOAL #4   Title  Patient to improve gait speed to >/= 3.5 ft/ sec for improved community navigation and safety.    Baseline  3.13 ft/sec at eval    Time  5    Period  Weeks    Status  New      PT SHORT TERM GOAL #5   Title  Pt will verbalize understanding of fall prevention in home environment.    Baseline  Verbally initiated at HEP based on fall risk.    Time  5    Period  Weeks    Status  New        PT Long Term Goals - 05/11/19 1459      PT LONG TERM GOAL #1   Title  Patient will be independent with advanced  HEP for balance and strength and gait for improved overall mobility.  TARGET for all LTGs 06/22/2019    Time  7    Period  Weeks    Status  New      PT LONG TERM GOAL #2   Title  Pt will improve 5x sit<>stand to less than or equal to 12 sec to demonstrate improved functional strength and transfer efficiency.    Time  7    Period  Weeks    Status  New      PT LONG TERM GOAL #3   Title  Patient to improve FGA to >/= 25/30 demonstrating improved dynamic balance:    Time  7    Period  Weeks    Status  New      PT LONG TERM GOAL #4   Title  Pt will ambulate at least 1000 ft, indoor and outdoor surfaces, no assistive device, independently with no LOB, for improved community mobility.    Time  7    Period  Weeks    Status  New             Plan - 05/11/19 1451    Clinical Impression Statement  Pt is a 74 year old female who presents to [facility] with history of imbalance due to old CVAs.  She has had no falls, but reports increasingly off-balance.  She presents with decreased functional lower extremity strength, decreased balance, abnormality of gait, decreased vestibular system use for balance.  She is at high fall risk per FGA score of 17/30 and demo decr BLE strength wtih 5x sit<>stand test score of 17.4 seconds.  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  She would benefit from skilled PT to address the above stated deficits to decrease fall risk and improve functional mobility.    Personal Factors and Comorbidities  Comorbidity 3+    Comorbidities  DM2, CVA, HTN, osteoporosis, glaucoma, L visual field deficits    Examination-Activity Limitations  Locomotion Level;Transfers;Stand    Examination-Participation Restrictions  Community Activity;Shop    Stability/Clinical Decision Making  Evolving/Moderate complexity    Clinical Decision Making  Moderate  Rehab Potential  Good    PT Frequency  2x / week    PT Duration  6 weeks   plus  eval visit, = 7  weeks of POC   PT Treatment/Interventions  ADLs/Self Care Home Management;DME Instruction;Gait training;Functional mobility training;Therapeutic activities;Therapeutic exercise;Balance training;Neuromuscular re-education;Patient/family education    PT Next Visit Plan  Initiate HEP to address balance:  corner balance, compliant surfaces, functional lower extremity strengthening.  May want to look at previous HEP and review/update that as needed.    Consulted and Agree with Plan of Care  Patient       Patient will benefit from skilled therapeutic intervention in order to improve the following deficits and impairments:  Abnormal gait, Difficulty walking, Decreased balance, Decreased mobility, Decreased strength  Visit Diagnosis: Other abnormalities of gait and mobility  Unsteadiness on feet  Muscle weakness (generalized)     Problem List Patient Active Problem List   Diagnosis Date Noted  . Bilateral carpal tunnel syndrome 03/27/2019  . Osteoarthritis of both knees 11/27/2018  . CKD (chronic kidney disease), stage II 11/27/2018  . GERD (gastroesophageal reflux disease) 06/26/2018  . Homonymous hemianopia, left 02/08/2018  . HTN (hypertension)   . Trigger finger, right index finger 10/17/2017  . Low back pain 07/29/2017  . Carotid artery stenosis 11/02/2016  . Left arm weakness   . Diastolic dysfunction   . Hypertension with heart disease   . Tobacco abuse   . Acute blood loss anemia   . Cerebral infarction due to embolism of right middle cerebral artery ([place])   . Perirectal abscess 09/26/2015  . Vitamin D deficiency 07/23/2015  . Osteopenia 02/16/2013  . Ankle fracture 01/19/2013  . Anemia 09/27/2011  . Type II diabetes mellitus with nephropathy ([place]) 09/24/2011  . Hyperlipidemia 09/24/2011    Sarah [name] W. 05/11/2019, 3:02 PM  Frazier Butt., PT   [facility] [address] [place], [place],  [address] Phone: [phone]   Fax:  [phone]  Name: Sarah [name] MRN: [id number] Date of Birth: Aug 02, 1945

## 2019-05-15 ENCOUNTER — Other Ambulatory Visit: Payer: Self-pay

## 2019-05-15 ENCOUNTER — Encounter: Payer: Self-pay | Admitting: Physical Therapy

## 2019-05-15 ENCOUNTER — Ambulatory Visit: Payer: Medicare Other | Admitting: Speech Pathology

## 2019-05-15 ENCOUNTER — Ambulatory Visit: Payer: Medicare Other | Attending: Family Medicine | Admitting: Physical Therapy

## 2019-05-15 DIAGNOSIS — R482 Apraxia: Secondary | ICD-10-CM | POA: Insufficient documentation

## 2019-05-15 DIAGNOSIS — R2681 Unsteadiness on feet: Secondary | ICD-10-CM

## 2019-05-15 DIAGNOSIS — M6281 Muscle weakness (generalized): Secondary | ICD-10-CM

## 2019-05-15 DIAGNOSIS — R2689 Other abnormalities of gait and mobility: Secondary | ICD-10-CM

## 2019-05-15 NOTE — Therapy (Signed)
[facility] [address] [place], [place], [address] Phone: [phone]   Fax:  [phone]  Speech Language Pathology Treatment  Patient Details  Name: Sarah [name] MRN: [id number] Date of Birth: April 20, 1945 Referring Provider (SLP): Frann Rider, NP   Encounter Date: 05/15/2019  End of Session - 05/15/19 1827    Visit Number  2    Number of Visits  17    Date for SLP Re-Evaluation  08/08/19   90 days   SLP Start Time  [id number]    SLP Stop Time   1610    SLP Time Calculation (min)  40 min    Activity Tolerance  Patient tolerated treatment well       Past Medical History:  Diagnosis Date  . Bilateral carpal tunnel syndrome 03/27/2019  . Boils   . Glaucoma   . Heart murmur   . HTN (hypertension)   . Hx of adenomatous colonic polyps   . Hyperlipidemia   . Osteoporosis   . Pneumonia   . Stroke ([place])   . Type II or unspecified type diabetes mellitus without mention of complication, uncontrolled     Past Surgical History:  Procedure Laterality Date  . ABDOMINAL HYSTERECTOMY    . CARPAL TUNNEL RELEASE     right  . COLONOSCOPY  12-10-10   per Dr. Deatra Ina, clear, repeat in 7 yrs   . INCISION AND DRAINAGE PERIRECTAL ABSCESS N/A 09/26/2015   Procedure: IRRIGATION AND DEBRIDEMENT PERIRECTAL ABSCESS;  Surgeon: Mickeal Skinner, MD;  Location: [facility];  Service: General;  Laterality: N/A;  . KNEE ARTHROSCOPY     right  . LOOP RECORDER INSERTION N/A 10/19/2016   Procedure: LOOP RECORDER INSERTION;  Surgeon: Thompson Grayer, MD;  Location: [facility];  Service: Cardiovascular;  Laterality: N/A;  . ORIF ANKLE FRACTURE Right 03/24/2012   Procedure: OPEN REDUCTION INTERNAL FIXATION (ORIF) ANKLE FRACTURE;  Surgeon: Newt Minion, MD;  Location: [facility];  Service: Orthopedics;  Laterality: Right;  Open Reduction Internal Fixation Right Bimalleolar ankle fracture  . POLYPECTOMY    . TEE WITHOUT CARDIOVERSION N/A  10/18/2016   Procedure: TRANSESOPHAGEAL ECHOCARDIOGRAM (TEE);  Surgeon: Fay Records, MD;  Location: [facility] ENDOSCOPY;  Service: Cardiovascular;  Laterality: N/A;  . TONSILLECTOMY      There were no vitals filed for this visit.  Subjective Assessment - 05/15/19 1532    Subjective  "It went okay."    Currently in Pain?  No/denies            ADULT SLP TREATMENT - 05/15/19 1532      General Information   Behavior/Cognition  Alert;Cooperative      Treatment Provided   Treatment provided  Cognitive-Linquistic      Pain Assessment   Pain Assessment  No/denies pain      Cognitive-Linquistic Treatment   Treatment focused on  Apraxia;Patient/family/caregiver education    Skilled Treatment  Therapist reviewed HEP with pt, who required cues for frequency (repeat x3), overarticulation, and slower rate. SLP used mirror with pt for feedback re: overarticulation and instructed re: emphasis on initial and word final consonants for improved intelligibility. Pt used slower rate in structured sentence tasks with occasional min A; required mod A frequently for carryover in simple conversation. Therapist worked with pt to ID other opportunities for practicing slower rate, such as when she goes through the [place].       Assessment / Recommendations / Plan   Plan  Continue  with current plan of care      Progression Toward Goals   Progression toward goals  Progressing toward goals         SLP Short Term Goals - 05/15/19 1828      SLP SHORT TERM GOAL #1   Title  pt will perform HEP with occasional min A x2 sessions    Time  4    Period  Weeks    Status  On-going      SLP SHORT TERM GOAL #2   Title  pt will engage in 5 minutes simple converstion with modified independence    Time  4    Period  Weeks    Status  On-going      SLP SHORT TERM GOAL #3   Title  pt will demo compensations for speech with 18/20 sentences x2 sessions    Time  4    Period  Weeks    Status  On-going       SLP SHORT TERM GOAL #4   Status  On-going       SLP Long Term Goals - 05/15/19 1828      SLP LONG TERM GOAL #1   Title  pt will engage in 10 minutes WFL simple-mod complex conversation with modified indpeendence (compensations) x2 sessions    Time  8    Period  Weeks    Status  On-going      SLP LONG TERM GOAL #2   Title  Pt will perform HEP with modified independence x3 sessions    Time  8    Period  Weeks    Status  On-going       Plan - 05/15/19 1827    Clinical Impression Statement  Pt presents today with what appears to be mild verbal apraxia developing over the last 2 months. Her speech is best characterized by slight hesitancy and pausing in both reading and in spontaneous conversation. Oral-motor testing reveals slight incoordination of labial and lingual musculature with WNL/WFL strength. She reports rarely-occasionally people are asking her to repeat. She would like to take a more conservative approach to speech therapy (ST) and have ST once/week and then incr frequency should she require. SLP, however, recommends x2/week and plan will be written for x2/week x8 weeks in case pt requires greater frequency than her desired x1/week. Pt was provided some material today to practice routinely at home. Skiled ST would be beneficial for the pt in order to make communication more efficient and easy for pt.    Speech Therapy Frequency  2x / week    Duration  --   x8 weeks - or 17 visist total      Patient will benefit from skilled therapeutic intervention in order to improve the following deficits and impairments:   Verbal apraxia    Problem List Patient Active Problem List   Diagnosis Date Noted  . Bilateral carpal tunnel syndrome 03/27/2019  . Osteoarthritis of both knees 11/27/2018  . CKD (chronic kidney disease), stage II 11/27/2018  . GERD (gastroesophageal reflux disease) 06/26/2018  . Homonymous hemianopia, left 02/08/2018  . HTN (hypertension)   . Trigger finger,  right index finger 10/17/2017  . Low back pain 07/29/2017  . Carotid artery stenosis 11/02/2016  . Left arm weakness   . Diastolic dysfunction   . Hypertension with heart disease   . Tobacco abuse   . Acute blood loss anemia   . Cerebral infarction due to embolism of right middle cerebral  artery ([place])   . Perirectal abscess 09/26/2015  . Vitamin D deficiency 07/23/2015  . Osteopenia 02/16/2013  . Ankle fracture 01/19/2013  . Anemia 09/27/2011  . Type II diabetes mellitus with nephropathy ([place]) 09/24/2011  . Hyperlipidemia 09/24/2011   Deneise Lever, [place], [place] 05/15/2019, 6:28 PM  [facility] [address] [place], [place], [address] Phone: [phone]   Fax:  [phone]   Name: Sarah [name] MRN: [id number] Date of Birth: Mar 18, 1945

## 2019-05-15 NOTE — Therapy (Signed)
[facility] [address] [place], [place], [address] Phone: [phone]   Fax:  [phone]  Physical Therapy Treatment  Patient Details  Name: Sarah [name] MRN: [id number] Date of Birth: September 27, 1945 Referring Provider (PT): Frann Rider, NP   Encounter Date: 05/15/2019  PT End of Session - 05/15/19 1452    Visit Number  2    Number of Visits  13    Date for PT Re-Evaluation  08/09/19   for 6 week POC, 90 day Why Medicare - 10th visit PN    Progress Note Due on Visit  10    PT Start Time  1449    PT Stop Time  1528    PT Time Calculation (min)  39 min    Activity Tolerance  Patient tolerated treatment well;No increased pain;Patient limited by fatigue    Behavior During Therapy  [facility] for tasks assessed/performed       Past Medical History:  Diagnosis Date  . Bilateral carpal tunnel syndrome 03/27/2019  . Boils   . Glaucoma   . Heart murmur   . HTN (hypertension)   . Hx of adenomatous colonic polyps   . Hyperlipidemia   . Osteoporosis   . Pneumonia   . Stroke ([place])   . Type II or unspecified type diabetes mellitus without mention of complication, uncontrolled     Past Surgical History:  Procedure Laterality Date  . ABDOMINAL HYSTERECTOMY    . CARPAL TUNNEL RELEASE     right  . COLONOSCOPY  12-10-10   per Dr. Deatra Ina, clear, repeat in 7 yrs   . INCISION AND DRAINAGE PERIRECTAL ABSCESS N/A 09/26/2015   Procedure: IRRIGATION AND DEBRIDEMENT PERIRECTAL ABSCESS;  Surgeon: Mickeal Skinner, MD;  Location: [facility];  Service: General;  Laterality: N/A;  . KNEE ARTHROSCOPY     right  . LOOP RECORDER INSERTION N/A 10/19/2016   Procedure: LOOP RECORDER INSERTION;  Surgeon: Thompson Grayer, MD;  Location: [facility] CV LAB;  Service: Cardiovascular;  Laterality: N/A;  . ORIF ANKLE FRACTURE Right 03/24/2012   Procedure: OPEN REDUCTION INTERNAL FIXATION (ORIF) ANKLE FRACTURE;  Surgeon:  Newt Minion, MD;  Location: [facility];  Service: Orthopedics;  Laterality: Right;  Open Reduction Internal Fixation Right Bimalleolar ankle fracture  . POLYPECTOMY    . TEE WITHOUT CARDIOVERSION N/A 10/18/2016   Procedure: TRANSESOPHAGEAL ECHOCARDIOGRAM (TEE);  Surgeon: Fay Records, MD;  Location: [facility] ENDOSCOPY;  Service: Cardiovascular;  Laterality: N/A;  . TONSILLECTOMY      There were no vitals filed for this visit.  Subjective Assessment - 05/15/19 1452    Subjective  No new complaints. No falls or pain to report. Has not been doing the ex's from last episode of care as she can not find the handouts.    Pertinent History  DM2, CVA, HTN, osteoporosis, glaucoma, L visual field deficits    Patient Stated Goals  Pt's goal for therapy is to get stronger and be able to walk without feeling unbalanced.    Currently in Pain?  No/denies       Treatment: Reviewed pt's HEP from last episode of care. Pt with decreased balance with corner balance ex's, therefore downgraded them this session. Added in new counter ex's and kept others from previous session. Issued the following to pt's HEP. Min guard assist for balance ex's. Cues for form and technique with all ex's.    Access Code: [email or web address] URL: [email or web address] Date:  05/15/2019 Prepared by: Willow Ora  Exercises Walking March - 1 x daily - 5 x weekly - 3-4 reps - 1 sets Walking with Head Rotation - 1 x daily - 5 x weekly - 1 sets - 3-4 reps Tandem Walking with Counter Support - 1 x daily - 5 x weekly - 1 sets - 3-4 reps Standing Single Leg Stance with Unilateral Counter Support - 1 x daily - 5 x weekly - 10 second hold - 3 reps Sit to Stand without Arm Support - 1 x daily - 7 x weekly - 10 reps - 2 sets Standing Near Stance in Corner with Eyes Closed - 1 x daily - 5 x weekly - 1 sets - 3 reps Standing Balance in Corner with Eyes Closed Head Movements - 1 x daily - 5 x weekly - 1 sets - 10 reps        PT Education -  05/15/19 1520    Education Details  reviewed and updated/downgraded as indicated HEP from last episode of care    Person(s) Educated  Patient    Methods  Explanation;Demonstration;Handout    Comprehension  Verbalized understanding;Returned demonstration;Verbal cues required;Need further instruction       PT Short Term Goals - 05/11/19 1456      PT SHORT TERM GOAL #1   Title  Pt will be independent with HEP for improved strength, balance, transfers, and gait.  TARGET 06/08/2019    Time  5    Period  Weeks    Status  New      PT SHORT TERM GOAL #2   Title  Pt will improve 5x sit<>stand to less than or equal to 14 sec to demonstrate improved functional strength and transfer efficiency.    Baseline  17.40 sec at eval    Time  5    Period  Weeks    Status  New      PT SHORT TERM GOAL #3   Title  Patient to improve FGA to >/= 22/30 demonstrating improved dynamic balance and decreased fall risk    Baseline  17/30 at eval    Time  5    Period  Weeks    Status  New      PT SHORT TERM GOAL #4   Title  Patient to improve gait speed to >/= 3.5 ft/ sec for improved community navigation and safety.    Baseline  3.13 ft/sec at eval    Time  5    Period  Weeks    Status  New      PT SHORT TERM GOAL #5   Title  Pt will verbalize understanding of fall prevention in home environment.    Baseline  Verbally initiated at HEP based on fall risk.    Time  5    Period  Weeks    Status  New        PT Long Term Goals - 05/11/19 1459      PT LONG TERM GOAL #1   Title  Patient will be independent with advanced HEP for balance and strength and gait for improved overall mobility.  TARGET for all LTGs 06/22/2019    Time  7    Period  Weeks    Status  New      PT LONG TERM GOAL #2   Title  Pt will improve 5x sit<>stand to less than or equal to 12 sec to demonstrate improved functional strength and transfer efficiency.  Time  7    Period  Weeks    Status  New      PT LONG TERM GOAL #3    Title  Patient to improve FGA to >/= 25/30 demonstrating improved dynamic balance:    Time  7    Period  Weeks    Status  New      PT LONG TERM GOAL #4   Title  Pt will ambulate at least 1000 ft, indoor and outdoor surfaces, no assistive device, independently with no LOB, for improved community mobility.    Time  7    Period  Weeks    Status  New            Plan - 05/15/19 1453    Clinical Impression Statement  Today's skilled session focused on review of HEP from last episode of care. Added in new ex's, downgraded corner balance ex's due to decreased balance at this time and kept some the same. Refer to [facility] code/program for full details. The pt is progressing toward goals and should benefit from continued PT to progress toward unmet goals.    Personal Factors and Comorbidities  Comorbidity 3+    Comorbidities  DM2, CVA, HTN, osteoporosis, glaucoma, L visual field deficits    Examination-Activity Limitations  Locomotion Level;Transfers;Stand    Examination-Participation Restrictions  Community Activity;Shop    Stability/Clinical Decision Making  Evolving/Moderate complexity    Rehab Potential  Good    PT Frequency  2x / week    PT Duration  6 weeks   plus  eval visit, = 7 weeks of POC   PT Treatment/Interventions  ADLs/Self Care Home Management;DME Instruction;Gait training;Functional mobility training;Therapeutic activities;Therapeutic exercise;Balance training;Neuromuscular re-education;Patient/family education    PT Home Exercise Plan  Access Code: [email or web address]    Consulted and Agree with Plan of Care  Patient       Patient will benefit from skilled therapeutic intervention in order to improve the following deficits and impairments:  Abnormal gait, Difficulty walking, Decreased balance, Decreased mobility, Decreased strength  Visit Diagnosis: Other abnormalities of gait and mobility  Unsteadiness on feet  Muscle weakness (generalized)     Problem List Patient  Active Problem List   Diagnosis Date Noted  . Bilateral carpal tunnel syndrome 03/27/2019  . Osteoarthritis of both knees 11/27/2018  . CKD (chronic kidney disease), stage II 11/27/2018  . GERD (gastroesophageal reflux disease) 06/26/2018  . Homonymous hemianopia, left 02/08/2018  . HTN (hypertension)   . Trigger finger, right index finger 10/17/2017  . Low back pain 07/29/2017  . Carotid artery stenosis 11/02/2016  . Left arm weakness   . Diastolic dysfunction   . Hypertension with heart disease   . Tobacco abuse   . Acute blood loss anemia   . Cerebral infarction due to embolism of right middle cerebral artery ([place])   . Perirectal abscess 09/26/2015  . Vitamin D deficiency 07/23/2015  . Osteopenia 02/16/2013  . Ankle fracture 01/19/2013  . Anemia 09/27/2011  . Type II diabetes mellitus with nephropathy ([place]) 09/24/2011  . Hyperlipidemia 09/24/2011    Willow Ora, PTA, [facility] [address], [place], [place] [address] [phone] 05/16/19, 11:46 AM   Name: Sarah [name] MRN: [id number] Date of Birth: 06/19/1945

## 2019-05-15 NOTE — Patient Instructions (Signed)
Access Code: [email or web address] URL: [email or web address] Date: 05/15/2019 Prepared by: Willow Ora  Exercises Walking March - 1 x daily - 5 x weekly - 3-4 reps - 1 sets Walking with Head Rotation - 1 x daily - 5 x weekly - 1 sets - 3-4 reps Tandem Walking with Counter Support - 1 x daily - 5 x weekly - 1 sets - 3-4 reps Standing Single Leg Stance with Unilateral Counter Support - 1 x daily - 5 x weekly - 10 second hold - 3 reps Sit to Stand without Arm Support - 1 x daily - 7 x weekly - 10 reps - 2 sets Standing Near Stance in Corner with Eyes Closed - 1 x daily - 5 x weekly - 1 sets - 3 reps Standing Balance in Corner with Eyes Closed Head Movements - 1 x daily - 5 x weekly - 1 sets - 10 reps

## 2019-05-17 LAB — CUP PACEART REMOTE DEVICE CHECK
Date Time Interrogation Session: 20210505041616
Implantable Pulse Generator Implant Date: 20181009

## 2019-05-18 ENCOUNTER — Other Ambulatory Visit: Payer: Self-pay

## 2019-05-18 ENCOUNTER — Encounter: Payer: Self-pay | Admitting: Rehabilitative and Restorative Service Providers"

## 2019-05-18 ENCOUNTER — Ambulatory Visit: Payer: Medicare Other | Admitting: Rehabilitative and Restorative Service Providers"

## 2019-05-18 NOTE — Therapy (Unsigned)
[facility] [address] [place], [place], [address] Phone: [phone]   Fax:  [phone]  Patient Details  Name: Sarah [name] MRN: [id number] Date of Birth: January 22, 1945 Referring Provider:  [name], [name], MD  Encounter Date: 05/18/2019  Patient arrived to today's visit and while walking back into clinic with this PT reported she had a sore throat this morning, has a headache, and feels congested like she is getting a bad cold. Her headache was getting worse this afternoon, and she reports she almost called to reschedule appointment, but decided to try to come. In light of this report, PT recommends continuation with speech and physical therapies next week as scheduled pending symptoms resolve. Patient agreed and voiced understanding.   Juliann Pulse, PT, DPT  05/18/2019, 1:29 PM  [facility] [address] [place], [place], [address] Phone: [phone]   Fax:  [phone]

## 2019-05-21 ENCOUNTER — Other Ambulatory Visit: Payer: Self-pay | Admitting: *Deleted

## 2019-05-21 ENCOUNTER — Ambulatory Visit (INDEPENDENT_AMBULATORY_CARE_PROVIDER_SITE_OTHER): Payer: Medicare Other | Admitting: *Deleted

## 2019-05-21 DIAGNOSIS — I639 Cerebral infarction, unspecified: Secondary | ICD-10-CM | POA: Diagnosis not present

## 2019-05-21 DIAGNOSIS — E1121 Type 2 diabetes mellitus with diabetic nephropathy: Secondary | ICD-10-CM

## 2019-05-21 DIAGNOSIS — I119 Hypertensive heart disease without heart failure: Secondary | ICD-10-CM

## 2019-05-22 ENCOUNTER — Ambulatory Visit: Payer: Medicare Other | Admitting: Speech Pathology

## 2019-05-22 ENCOUNTER — Other Ambulatory Visit: Payer: Self-pay

## 2019-05-22 NOTE — Chronic Care Management (AMB) (Signed)
Chronic Care Management Pharmacy  Name: Sarah [name]  MRN: [id number] DOB: [birth year]  Chief Complaint/ HPI  Sarah [name],  74 y.o. , female presents for their Initial CCM visit with the clinical pharmacist via telephone due to COVID-19 Pandemic. She is very loving and she takes care of her 63 yo mother daily. They live together (she lives upstairs and her mom downstairs). She also lives with one of her sisters in the same compound. She expressed to be overwhelmed with taking care of everyone that sometimes she doesn't have time to take care of herself. She was glad when one of her siblings visited her and her mother because finally there was someone to help her out. She was able to accomplish more things for her that time. Her biggest concern now is her diabetes. She wants to get back in shape and want to enjoy her days healthy.  PCP : [name], Sarah G, MD  Their chronic conditions include: HTN, HLD, type 2 diabetes, CKD stage 2, GERD, hx of stroke  Office Visits: 04/27/19 OV - Chronic dx management. Unconrolled DM. Started on humalog 5-10 units 15 mins before meals TID. Lantus increased to 35 units in the morning. Amlodipine increased to 10 mg from 7.5 mg. F/u in 3-4 months  Consult Visit: 03/27/19 [facility] Sarah [name], Neuro) - Numbness and tingling in left arm. Evidence of mild bilateral carpal tunnel syndrome.  02/13/19 OV ([name], Neuro) - 6 mon f/u visit for MCA 10/18 and 1/20 right parieto-occipital stroke. No changes with medications. F/u in 4 months.  Medications: Outpatient Encounter Medications as of 05/24/2019  Medication Sig Note  . acetaminophen (TYLENOL) 500 MG tablet Take 1,000 mg by mouth every 6 (six) hours as needed for mild pain or headache.   Marland Kitchen amLODipine (NORVASC) 10 MG tablet Take 1 tablet (10 mg total) by mouth daily. (Patient taking differently: Take 10 mg by mouth daily. Pt reports taking 2 10 mg tablets)   . atorvastatin (LIPITOR) 80 MG tablet Take 1 tablet (80  mg total) by mouth daily at 6 PM.   . Blood Glucose Calibration (OT ULTRA/FASTTK CNTRL SOLN) SOLN    . calcium carbonate (TUMS EX) 750 MG chewable tablet Chew 1 tablet by mouth as needed for heartburn.   . clopidogrel (PLAVIX) 75 MG tablet TAKE 1 TABLET BY MOUTH DAILY   . diclofenac Sodium (VOLTAREN) 1 % GEL Apply 2 g topically 4 (four) times daily.   Marland Kitchen ezetimibe (ZETIA) 10 MG tablet Take 1 tablet (10 mg total) by mouth daily.   . Insulin Glargine (LANTUS SOLOSTAR) 100 UNIT/ML Solostar Pen Inject 30 Units into the skin 2 (two) times daily.   . Insulin Pen Needle (BD PEN NEEDLE NANO U/F) 32G X 4 MM MISC USE UP TO FOUR TIMES DAILY   . Lancet Devices (SIMPLE DIAGNOSTICS LANCING DEV) MISC Check BS before and 2 hours after meals.   . latanoprost (XALATAN) 0.005 % ophthalmic solution Place 1 drop into both eyes at bedtime.   . liraglutide (VICTOZA) 18 MG/3ML SOPN Inject 0.3 mLs (1.8 mg total) into the skin daily.   Marland Kitchen losartan (COZAAR) 50 MG tablet TAKE 1 TABLET(50 MG) BY MOUTH DAILY   . metFORMIN (GLUCOPHAGE) 500 MG tablet Take 1 tablet (500 mg total) by mouth 2 (two) times daily with a meal.   . ONE TOUCH ULTRA TEST test strip Check BS before and 2 hours after meals.   Marland Kitchen PHARMACIST CHOICE ALCOHOL 70 % PADS    .  ROCKLATAN 0.02-0.005 % SOLN Place 1 drop into both eyes every evening.   . Vitamin D, Ergocalciferol, (DRISDOL) 1.25 MG (50000 UNIT) CAPS capsule TAKE ONE CAPSULE BY MOUTH TWICE A WEEK( [place])   . famotidine (PEPCID) 40 MG tablet TAKE 1 TABLET(40 MG) BY MOUTH AT BEDTIME (Patient not taking: Reported on 05/11/2019) 12/25/2018: Takes as needed.  . insulin lispro (HUMALOG) 100 UNIT/ML injection 5-10 U 15 min before meals. (Patient not taking: Reported on 05/25/2019)   . omeprazole (PRILOSEC) 20 MG capsule Take 1 capsule (20 mg total) by mouth daily. (Patient not taking: Reported on 05/11/2019)    No facility-administered encounter medications on file as of 05/24/2019.     Food  Insecurity: No Food Insecurity  . Worried About Charity fundraiser in the Last Year: Never true  . Ran Out of Food in the Last Year: Never true     Stress: Stress Concern Present  . Feeling of Stress : To some extent   Current Diagnosis/Assessment:  Goals Addressed            This Visit's Progress   . Diabetes A1c < 7.0%       CARE PLAN ENTRY (see longitudinal plan of care for additional care plan information)  Current Barriers:  . Diabetes: type 2; complicated by chronic medical conditions including high cholesterol, high blood pressure Lab Results  Component Value Date   HGBA1C 12.3 (A) 04/27/2019 .   Lab Results  Component Value Date   CREATININE 0.55 04/27/2019   CREATININE 0.50 11/27/2018   CREATININE 0.52 06/26/2018 .   Marland Kitchen No results found for: EGFR . Current antihyperglycemic regimen: . Denies hypoglycemic symptoms . Denies hyperglycemic symptoms . Current meal patters: sodas . Current exercise: once in awhile walking in the compound . Current blood glucose readings: None . Cardiovascular risk reduction: o Current hypertensive regimen: Amlodipine 10 mg 2 tablets daily AM, losartan 50 mg 1 tablet daily AM o Current hyperlipidemia regimen: Atorvastatin 80 mg 1 tablet HS, ezetimibe 10 mg 1 tablet AM o Current antiplatelet regimen: None  Pharmacist Clinical Goal(s):  Marland Kitchen Over the next 30 days, patient will work with PharmD and primary care provider to address low carb and sugar diet  Interventions: . Comprehensive medication review performed, medication list updated in electronic medical record . Inter-disciplinary care team collaboration (see longitudinal plan of care) . Counseled patient on the importance of routinely checking blood sugar . Educated patient on cutting down on sugary drinks  Patient Self Care Activities:  . Over the next 30 days, patient will check blood glucose once a day, document, and provide at future appointments . Over the next 30 days,  patient will cut down on sodas and carbohydrates . Patient will take medications as prescribed . Patient will contact provider with any episodes of hypoglycemia . Patient will report any questions or concerns to provider   Initial goal documentation        Hypertension   Office blood pressures are  BP Readings from Last 3 Encounters:  04/27/19 132/80  02/13/19 130/60  11/27/18 140/76   Patient has failed these meds in the past: lisinopril, valsartan Patient is currently controlled on the following medications:   Amlodipine 10 mg 2 tablets daily AM  Losartan 50 mg 1 tablet daily AM  Patient checks BP at home infrequently  We discussed the importance of diet and routine exercise. Patient states that she goes walking around their compound once in awhile. She already uses  salt alternative to season her food. She uses Illene Labrador no salt seasoning substitute after she had her first stroke. Her BP is well controlled. Educated her the importance of routinely checking her blood pressure. Denies any headaches, shortness of breath, chest pains  Plan  Continue current medications and control with diet and exercise     Hyperlipidemia   Lipid Panel     Component Value Date/Time   CHOL 203 (H) 04/27/2019 1100   TRIG 114.0 04/27/2019 1100   HDL 65.10 04/27/2019 1100   CHOLHDL 3 04/27/2019 1100   VLDL 22.8 04/27/2019 1100   LDLCALC 115 (H) 04/27/2019 1100     The ASCVD Risk score (Goff DC Jr., et al., 2013) failed to calculate for the following reasons:   The patient has a prior MI or stroke diagnosis   Patient has failed these meds in past: Simvastatin Patient is currently uncontrolled on the following medications:  Atorvastatin 80 mg 1 tablet daily HS   Ezetimibe 10 mg 1 tablet daily AM  We discussed the importance of limiting fried foods and red meats. She cooks her own food, but doesn't have enough time to do everything since she has to take care of her 46 yo mom every  day. She is willing to cut back on cholesterol and she asked if I could send her weekly recipes so that she just makes them and not worry about contemplating what to prepare. Encouraged her to keep on walking at least once daily around her compound and she agreed to it since the weather is getting nicer outside.  Plan  Continue current medications and control with diet and exercise  Diabetes   Recent Relevant Labs: Lab Results  Component Value Date/Time   HGBA1C 12.3 (A) 04/27/2019 10:31 AM   HGBA1C 9.7 (A) 11/27/2018 11:57 AM   HGBA1C 10.3 (H) 06/26/2018 10:27 AM   HGBA1C 13.5 (H) 12/28/2017 10:43 AM   GFR 131.56 04/27/2019 11:00 AM   GFR 147.03 11/27/2018 11:58 AM   MICROALBUR 96.5 (H) 04/27/2019 11:00 AM   MICROALBUR 326.6 (H) 11/27/2018 11:58 AM     Checking BG: Rarely  Patient has failed these meds in past: Toujeo, novolog, trulicity Patient is currently uncontrolled on the following medications:   Lantus 100units/mL 35 units AM and 30 units HS  Humalog 100 units/mL 5-10 units before meals   Metformin 500 mg 1 tablet BID with a meal   Victoza 29m/3mL inject 1.8 mg daily  Last diabetic Eye exam:  Lab Results  Component Value Date/Time   HMDIABEYEEXA No Retinopathy 07/22/2015 12:00 AM    Last diabetic Foot exam: No results found for: HMDIABFOOTEX   We discussed her lifestyle changes when her A1c went down from 13.5 to 9.7 in a year. She reflected that she stopped drinking sodas then and now realized that she started drinking them again that's why her A1c shoot back up again. She said that she will stop drinking sodas again. She has concerns about taking metformin since it makes her bowel not normal. She also that she has not started humalog because her pharmacy gave her vials instead of pens.  Plan  Continue current medications and control with diet and exercise  Switch metformin 500 mg BID to metformin ER 1000 mg daily Resend new rx for humalog pens  GERD    Patient has failed these meds in past: None Patient is currently controlled on the following medications:   Calcium carbonate 750 mg 1 tablet PRN  Patient  has not had any heartburns or upset stomach lately. Well controlled on tums Plan  Continue current medications   Hx of Cerebral Infacrtion   CVA x 2  Patient has failed these meds in past: None Patient is currently controlled on the following medications:   Clopidogrel 75 mg 1 tablet daily  Atorvastatin 80 mg 1 tablet daily HS   Patient denies uncontrolled bleeding or bruising lately. Educated her to do things slowly and reach out if she notices any severe bruising or bleeding  Plan  Continue current medications   OTCs/Health Maintenance   Patient is currently controlled on the following medications:   Acetaminophen 500 mg 2 tablets every 6 hrs PRN for mild pain or headache  Latanoprost 0.005% opth soln 1 drop into both eyes HS  Vitamin D 50,000 units 1 capsule twice a week (Monday and Thursday)  Voltaren 1% gel QID  Plan  Continue current medications  Vaccines   Reviewed and discussed patient's vaccination history.    Immunization History  Administered Date(s) Administered  . Influenza, High Dose Seasonal PF 11/28/2014, 10/23/2015, 10/27/2016, 12/19/2017  . Influenza,inj,Quad PF,6+ Mos 01/19/2013, 09/28/2013  . Pneumococcal Conjugate-13 03/15/2014  . Pneumococcal Polysaccharide-23 07/11/2013    Plan  Recommended patient receive shingles vaccine in office/pharmacy.   Medication Management   Pharmacy/Benefits: [facility] at [address] [facility] Patient seems to be adherent with her medications. She takes care of her mother's medications as well so she seems to be overwhelmed with everything.  Follow up: 1 month phone visit   Geraldine Contras, PharmD Clinical Pharmacist [facility] [place] [phone]

## 2019-05-24 ENCOUNTER — Ambulatory Visit: Payer: Medicare Other | Admitting: Physical Therapy

## 2019-05-24 ENCOUNTER — Other Ambulatory Visit: Payer: Self-pay

## 2019-05-24 ENCOUNTER — Ambulatory Visit: Payer: Medicare Other | Admitting: Pharmacist

## 2019-05-25 NOTE — Patient Instructions (Addendum)
Visit Information  It was a pleasure listening to your stories and knowing you more today, Sarah [name]! Thank you for meeting with me to discuss your medications! I look forward to working with you to achieve your health care goals. Below is a summary of what we talked about during the visit:  Goals Addressed            This Visit's Progress   . Diabetes A1c < 7.0%       CARE PLAN ENTRY (see longitudinal plan of care for additional care plan information)  Current Barriers:  . Diabetes: type 2; complicated by chronic medical conditions including high cholesterol, high blood pressure Lab Results  Component Value Date   HGBA1C 12.3 (A) 04/27/2019 .   Lab Results  Component Value Date   CREATININE 0.55 04/27/2019   CREATININE 0.50 11/27/2018   CREATININE 0.52 06/26/2018 .   Marland Kitchen No results found for: EGFR . Current antihyperglycemic regimen: . Denies hypoglycemic symptoms . Denies hyperglycemic symptoms . Current meal patters: sodas . Current exercise: once in awhile walking in the compound . Current blood glucose readings: None . Cardiovascular risk reduction: o Current hypertensive regimen: Amlodipine 10 mg 2 tablets daily AM, losartan 50 mg 1 tablet daily AM o Current hyperlipidemia regimen: Atorvastatin 80 mg 1 tablet HS, ezetimibe 10 mg 1 tablet AM o Current antiplatelet regimen: None  Pharmacist Clinical Goal(s):  Marland Kitchen Over the next 30 days, patient will work with PharmD and primary care provider to address low carb and sugar diet  Interventions: . Comprehensive medication review performed, medication list updated in electronic medical record . Inter-disciplinary care team collaboration (see longitudinal plan of care) . Counseled patient on the importance of routinely checking blood sugar . Educated patient on cutting down on sugary drinks  Patient Self Care Activities:  . Over the next 30 days, patient will check blood glucose once a day, document, and provide at future  appointments . Over the next 30 days, patient will cut down on sodas and carbohydrates . Patient will take medications as prescribed . Patient will contact provider with any episodes of hypoglycemia . Patient will report any questions or concerns to provider   Initial goal documentation        Sarah [name] was given information about Chronic Care Management services today including:  1. CCM service includes personalized support from designated clinical staff supervised by her physician, including individualized plan of care and coordination with other care providers 2. 24/7 contact phone numbers for assistance for urgent and routine care needs. 3. Standard insurance, coinsurance, copays and deductibles apply for chronic care management only during months in which we provide at least 20 minutes of these services. Most insurances cover these services at 100%, however patients may be responsible for any copay, coinsurance and/or deductible if applicable. This service may help you avoid the need for more expensive face-to-face services. 4. Only one practitioner may furnish and bill the service in a calendar month. 5. The patient may stop CCM services at any time (effective at the end of the month) by phone call to the office staff.  Patient agreed to services and verbal consent obtained.   The patient verbalized understanding of instructions provided today and agreed to receive a mailed copy of patient instruction and/or educational materials. Telephone follow up appointment with pharmacy team member scheduled for: 06/25/19 at 2 PM . Geraldine Contras, PharmD Clinical Pharmacist [facility] [phone]   Diabetes Mellitus and Nutrition, Adult  When you have diabetes (diabetes mellitus), it is very important to have healthy eating habits because your blood sugar (glucose) levels are greatly affected by what you eat and drink. Eating healthy foods in the appropriate  amounts, at about the same times every day, can help you:  Control your blood glucose.  Lower your risk of heart disease.  Improve your blood pressure.  Reach or maintain a healthy weight. Every person with diabetes is different, and each person has different needs for a meal plan. Your health care provider may recommend that you work with a diet and nutrition specialist (dietitian) to make a meal plan that is best for you. Your meal plan may vary depending on factors such as:  The calories you need.  The medicines you take.  Your weight.  Your blood glucose, blood pressure, and cholesterol levels.  Your activity level.  Other health conditions you have, such as heart or kidney disease. How do carbohydrates affect me? Carbohydrates, also called carbs, affect your blood glucose level more than any other type of food. Eating carbs naturally raises the amount of glucose in your blood. Carb counting is a method for keeping track of how many carbs you eat. Counting carbs is important to keep your blood glucose at a healthy level, especially if you use insulin or take certain oral diabetes medicines. It is important to know how many carbs you can safely have in each meal. This is different for every person. Your dietitian can help you calculate how many carbs you should have at each meal and for each snack. Foods that contain carbs include:  Bread, cereal, rice, pasta, and crackers.  Potatoes and corn.  Peas, beans, and lentils.  Milk and yogurt.  Fruit and juice.  Desserts, such as cakes, cookies, ice cream, and candy. How does alcohol affect me? Alcohol can cause a sudden decrease in blood glucose (hypoglycemia), especially if you use insulin or take certain oral diabetes medicines. Hypoglycemia can be a life-threatening condition. Symptoms of hypoglycemia (sleepiness, dizziness, and confusion) are similar to symptoms of having too much alcohol. If your health care provider says  that alcohol is safe for you, follow these guidelines:  Limit alcohol intake to no more than 1 drink per day for nonpregnant women and 2 drinks per day for men. One drink equals 12 oz of beer, 5 oz of wine, or 1 oz of hard liquor.  Do not drink on an empty stomach.  Keep yourself hydrated with water, diet soda, or unsweetened iced tea.  Keep in mind that regular soda, juice, and other mixers may contain a lot of sugar and must be counted as carbs. What are tips for following this plan?  Reading food labels  Start by checking the serving size on the "Nutrition Facts" label of packaged foods and drinks. The amount of calories, carbs, fats, and other nutrients listed on the label is based on one serving of the item. Many items contain more than one serving per package.  Check the total grams (g) of carbs in one serving. You can calculate the number of servings of carbs in one serving by dividing the total carbs by 15. For example, if a food has 30 g of total carbs, it would be equal to 2 servings of carbs.  Check the number of grams (g) of saturated and trans fats in one serving. Choose foods that have low or no amount of these fats.  Check the number of milligrams (mg) of salt (  sodium) in one serving. Most people should limit total sodium intake to less than 2,300 mg per day.  Always check the nutrition information of foods labeled as "low-fat" or "nonfat". These foods may be higher in added sugar or refined carbs and should be avoided.  Talk to your dietitian to identify your daily goals for nutrients listed on the label. Shopping  Avoid buying canned, premade, or processed foods. These foods tend to be high in fat, sodium, and added sugar.  Shop around the outside edge of the grocery store. This includes fresh fruits and vegetables, bulk grains, fresh meats, and fresh dairy. Cooking  Use low-heat cooking methods, such as baking, instead of high-heat cooking methods like deep  frying.  Cook using healthy oils, such as olive, canola, or sunflower oil.  Avoid cooking with butter, cream, or high-fat meats. Meal planning  Eat meals and snacks regularly, preferably at the same times every day. Avoid going long periods of time without eating.  Eat foods high in fiber, such as fresh fruits, vegetables, beans, and whole grains. Talk to your dietitian about how many servings of carbs you can eat at each meal.  Eat 4-6 ounces (oz) of lean protein each day, such as lean meat, chicken, fish, eggs, or tofu. One oz of lean protein is equal to: ? 1 oz of meat, chicken, or fish. ? 1 egg. ?  cup of tofu.  Eat some foods each day that contain healthy fats, such as avocado, nuts, seeds, and fish. Lifestyle  Check your blood glucose regularly.  Exercise regularly as told by your health care provider. This may include: ? 150 minutes of moderate-intensity or vigorous-intensity exercise each week. This could be brisk walking, biking, or water aerobics. ? Stretching and doing strength exercises, such as yoga or weightlifting, at least 2 times a week.  Take medicines as told by your health care provider.  Do not use any products that contain nicotine or tobacco, such as cigarettes and e-cigarettes. If you need help quitting, ask your health care provider.  Work with a Social worker or diabetes educator to identify strategies to manage stress and any emotional and social challenges. Questions to ask a health care provider  Do I need to meet with a diabetes educator?  Do I need to meet with a dietitian?  What number can I call if I have questions?  When are the best times to check my blood glucose? Where to find more information:  American Diabetes Association: diabetes.org  Academy of Nutrition and Dietetics: www.eatright.[employer] of Diabetes and Digestive and Kidney Diseases (NIH): [employer] Summary  A healthy meal plan will help you control your  blood glucose and maintain a healthy lifestyle.  Working with a diet and nutrition specialist (dietitian) can help you make a meal plan that is best for you.  Keep in mind that carbohydrates (carbs) and alcohol have immediate effects on your blood glucose levels. It is important to count carbs and to use alcohol carefully. This information is not intended to replace advice given to you by your health care provider. Make sure you discuss any questions you have with your health care provider. Document Revised: 12/10/2016 Document Reviewed: 02/02/2016 Elsevier Patient Education  2020 [employer].

## 2019-06-06 ENCOUNTER — Ambulatory Visit: Payer: Medicare Other | Admitting: Physical Therapy

## 2019-06-08 ENCOUNTER — Ambulatory Visit: Payer: Medicare Other | Admitting: Physical Therapy

## 2019-06-08 ENCOUNTER — Ambulatory Visit: Payer: Medicare Other

## 2019-06-12 ENCOUNTER — Ambulatory Visit: Payer: Medicare Other | Admitting: Speech Pathology

## 2019-06-12 ENCOUNTER — Ambulatory Visit: Payer: Medicare Other | Admitting: Physical Therapy

## 2019-06-13 ENCOUNTER — Other Ambulatory Visit: Payer: Self-pay | Admitting: Family Medicine

## 2019-06-14 ENCOUNTER — Ambulatory Visit: Payer: Medicare Other | Admitting: Physical Therapy

## 2019-06-16 ENCOUNTER — Other Ambulatory Visit: Payer: Self-pay | Admitting: Family Medicine

## 2019-06-19 ENCOUNTER — Ambulatory Visit: Payer: Medicare Other | Admitting: Physical Therapy

## 2019-06-19 ENCOUNTER — Encounter: Payer: Medicare Other | Admitting: Speech Pathology

## 2019-06-19 NOTE — Chronic Care Management (AMB) (Signed)
Chronic Care Management Pharmacy  Name: Sarah [name]  MRN: [id number] DOB: 11/09/1945  Chief Complaint/ HPI  Sarah [name],  74 y.o. , female presents for their Follow-Up CCM visit with the clinical pharmacist via telephone due to COVID-19 Pandemic. Patient is doing well and finally got started on her humalog pens. She mentions starting it over the weekend. She was sent humalog vials a month before and could not really use it. Patient reports waling 4 times a week now and follows weekly food schedule as a reference for diabetic diet.  PCP : [name], [name], MD  Their chronic conditions include: HTN, HLD, type 2 diabetes, CKD stage 2, GERD, hx of stroke  Office Visits: 04/27/19 OV - Chronic dx management. Unconrolled DM. Started on humalog 5-10 units 15 mins before meals TID. Lantus increased to 35 units in the morning. Amlodipine increased to 10 mg from 7.5 mg. F/u in 3-4 months  Consult Visit: 06/21/19 OV ([name], Neuro) - continue atorvastatin and zetia for stroke prevention. No changes with meds. Patient stable from stroke standpoint.   03/27/19 OV (Willis, Neuro) - Numbness and tingling in left arm. Evidence of mild bilateral carpal tunnel syndrome.  02/13/19 OV ([name], Neuro) - 6 mon f/u visit for MCA 10/18 and 1/20 right parieto-occipital stroke. No changes with medications. F/u in 4 months.  Medications: Outpatient Encounter Medications as of 06/25/2019  Medication Sig Note  . acetaminophen (TYLENOL) 500 MG tablet Take 1,000 mg by mouth every 6 (six) hours as needed for mild pain or headache.   Marland Kitchen amLODipine (NORVASC) 10 MG tablet Take 1 tablet (10 mg total) by mouth daily. (Patient taking differently: Take 10 mg by mouth daily. Pt reports taking 2 10 mg tablets)   . atorvastatin (LIPITOR) 80 MG tablet Take 1 tablet (80 mg total) by mouth daily at 6 PM.   . Blood Glucose Calibration (OT ULTRA/FASTTK CNTRL SOLN) SOLN    . calcium carbonate (TUMS EX) 750 MG chewable tablet  Chew 1 tablet by mouth as needed for heartburn.   . clopidogrel (PLAVIX) 75 MG tablet TAKE 1 TABLET BY MOUTH DAILY   . diclofenac Sodium (VOLTAREN) 1 % GEL Apply 2 g topically 4 (four) times daily.   Marland Kitchen ezetimibe (ZETIA) 10 MG tablet Take 1 tablet (10 mg total) by mouth daily.   . famotidine (PEPCID) 40 MG tablet TAKE 1 TABLET(40 MG) BY MOUTH AT BEDTIME 12/25/2018: Takes as needed.  . Insulin Glargine (LANTUS SOLOSTAR) 100 UNIT/ML Solostar Pen Inject 30 Units into the skin 2 (two) times daily.   . insulin lispro (HUMALOG KWIKPEN) 200 UNIT/ML KwikPen 5-10 U 15 min before meals   . Insulin Pen Needle (BD PEN NEEDLE NANO U/F) 32G X 4 MM MISC USE UP TO FOUR TIMES DAILY   . Lancet Devices (SIMPLE DIAGNOSTICS LANCING DEV) MISC Check BS before and 2 hours after meals.   . latanoprost (XALATAN) 0.005 % ophthalmic solution Place 1 drop into both eyes at bedtime.   . liraglutide (VICTOZA) 18 MG/3ML SOPN Inject 0.3 mLs (1.8 mg total) into the skin daily.   Marland Kitchen losartan (COZAAR) 50 MG tablet TAKE 1 TABLET(50 MG) BY MOUTH DAILY   . metFORMIN (GLUCOPHAGE) 500 MG tablet Take 1 tablet (500 mg total) by mouth 2 (two) times daily with a meal.   . omeprazole (PRILOSEC) 20 MG capsule Take 1 capsule (20 mg total) by mouth daily.   . ONE TOUCH ULTRA TEST test strip Check BS before and 2  hours after meals.   Marland Kitchen PHARMACIST CHOICE ALCOHOL 70 % PADS    . ROCKLATAN 0.02-0.005 % SOLN Place 1 drop into both eyes every evening.   . Vitamin D, Ergocalciferol, (DRISDOL) 1.25 MG (50000 UNIT) CAPS capsule TAKE ONE CAPSULE BY MOUTH TWICE A WEEK( [place])   . [DISCONTINUED] insulin lispro (HUMALOG) 100 UNIT/ML injection 5-10 U 15 min before meals. (Patient not taking: Reported on 05/25/2019)    No facility-administered encounter medications on file as of 06/25/2019.     Food Insecurity: No Food Insecurity  . Worried About Charity fundraiser in the Last Year: Never true  . Ran Out of Food in the Last Year: Never true      Stress: Stress Concern Present  . Feeling of Stress : To some extent   Current Diagnosis/Assessment:  Goals Addressed            This Visit's Progress   . Chronic Care Management       CARE PLAN ENTRY  Current Barriers:  . Chronic Disease Management support, education, and care coordination needs related to Hypertension, Hyperlipidemia, and Diabetes   Hypertension . Pharmacist Clinical Goal(s): o Over the next 30 days, patient will work with PharmD and providers to achieve BP goal <130/80 . Current regimen:   Amlodipine 10 mg 2 tablets daily AM  Losartan 50 mg 1 tablet daily AM . Interventions: o Discussed the importance of low salt diet and increase physical activity . Patient self care activities - Over the next 30 days, patient will: o Check BP once daily, document, and provide at future appointments o Ensure daily salt intake < 2300 mg/day o Continue to walk outside 4 days a week and follow weekly recipe  Hyperlipidemia . Pharmacist Clinical Goal(s): o Over the next 30 days, patient will work with PharmD and providers to achieve LDL goal < 100 . Current regimen:   Atorvastatin 80 mg 1 tablet daily HS   Ezetimibe 10 mg 1 tablet daily AM . Interventions: o Discussed the importance of heart healthy diet and increase physical activity . Patient self care activities - Over the next 30 days, patient will: o Continue to walk 4 days a week and follow weekly recipe  Diabetes . Pharmacist Clinical Goal(s): o Over the next 30 days, patient will work with PharmD and providers to achieve A1c goal <7% . Current regimen:   Lantus 100units/mL 35 units AM and 30 units every 12 hrs (breakfast and dinner)  Humalog 100 units/mL kwikpen 5-10 units 30 mins before meals   Metformin 500 mg 1 tablet BID with a meal   Victoza 18mg /60mL inject 1.8 mg daily at lunch . Interventions: o Encourage to increase physical activity o Benefits of switching from regular soda to diet  soda . Patient self care activities - Over the next 30 days, patient will: o Check blood sugar 3-4 times daily, document, and provide at future appointments o Contact provider with any episodes of hypoglycemia  Medication management . Pharmacist Clinical Goal(s): o Over the next 30 days, patient will work with PharmD and providers to maintain optimal medication adherence . Current pharmacy: [facility] . Interventions o Comprehensive medication review performed. o Continue current medication management strategy . Patient self care activities - Over the next 30 days, patient will: o Focus on medication adherence by pill boxes o Take medications as prescribed o Report any questions or concerns to PharmD and/or provider(s)  Initial goal documentation  Hypertension   Office blood pressures are  BP Readings from Last 3 Encounters:  06/21/19 (!) 146/72  04/27/19 132/80  02/13/19 130/60   Patient has failed these meds in the past: lisinopril, valsartan Patient is currently uncontrolled on the following medications:   Amlodipine 10 mg 2 tablets daily AM  Losartan 50 mg 1 tablet daily AM  Patient continues her daily walks. Tries to do walking 4 times a week within their compound. Still uses salt alternative for seasoning. Will have f/u visit with Dr. [name] on August. Advised to continue monitoring blood pressure at least once a day. Blood pressure was high during the last visit.  Plan  Continue current medications and control with diet and exercise     Hyperlipidemia   Lipid Panel     Component Value Date/Time   CHOL 203 (H) 04/27/2019 1100   TRIG 114.0 04/27/2019 1100   HDL 65.10 04/27/2019 1100   CHOLHDL 3 04/27/2019 1100   VLDL 22.8 04/27/2019 1100   LDLCALC 115 (H) 04/27/2019 1100     The ASCVD Risk score (Goff DC Jr., et al., 2013) failed to calculate for the following reasons:   The patient has a prior MI or stroke diagnosis   Patient has failed these  meds in past: Simvastatin Patient is currently uncontrolled on the following medications:  Atorvastatin 80 mg 1 tablet daily HS   Ezetimibe 10 mg 1 tablet daily AM  Patient reports walking at least 4 days a week and tries to follow a weekly recipe for her diet. Will have f/u visit with Dr. [name] on August. She just had a stroke f/u on 06/21/19. Patient doing better with no changes on meds.   Plan  Continue current medications and control with diet and exercise  Diabetes   Recent Relevant Labs: Lab Results  Component Value Date/Time   HGBA1C 12.3 (A) 04/27/2019 10:31 AM   HGBA1C 9.7 (A) 11/27/2018 11:57 AM   HGBA1C 10.3 (H) 06/26/2018 10:27 AM   HGBA1C 13.5 (H) 12/28/2017 10:43 AM   GFR 131.56 04/27/2019 11:00 AM   GFR 147.03 11/27/2018 11:58 AM   MICROALBUR 96.5 (H) 04/27/2019 11:00 AM   MICROALBUR 326.6 (H) 11/27/2018 11:58 AM     Patient has failed these meds in past: Toujeo, novolog, trulicity Patient is currently uncontrolled on the following medications:   Lantus 100units/mL 35 units AM and 30 units every 12 hrs (breakfast and dinner)  Humalog 100 units/mL kwikpen 5-10 units 30 mins before meals   Metformin 500 mg 1 tablet BID with a meal   Victoza 18mg /61mL inject 1.8 mg daily at lunch  Last diabetic Eye exam:  Lab Results  Component Value Date/Time   HMDIABEYEEXA No Retinopathy 07/22/2015 12:00 AM    Last diabetic Foot exam: No results found for: HMDIABFOOTEX   Patient just recently started using humalog pens and reports drinking diet soda instead of regular. Patient also reports walking 4 days a week around her area since there no more pollen and it is much nicer to go outside. Advised to f/u with me after a month to check on her progress. Patient will have f/u visit with Dr. [name] on August. Advised patient to take humalog 30 mins before meals and take lantus every morning and dinner. Also, inject victoza at lunch time.   Plan  Continue current medications  and control with diet and exercise  Follow-up after a month   GERD   Patient has failed these meds in past: None Patient  is currently controlled on the following medications:   Calcium carbonate 750 mg 1 tablet PRN  Patient has not had any heartburns or upset stomach lately. Well controlled on tums Plan  Continue current medications   Hx of Cerebral Infacrtion   CVA x 2  Patient has failed these meds in past: None Patient is currently controlled on the following medications:   Clopidogrel 75 mg 1 tablet daily  Atorvastatin 80 mg 1 tablet daily HS   Patient denies uncontrolled bleeding or bruising lately. Educated her to do things slowly and reach out if she notices any severe bruising or bleeding  Plan  Continue current medications   OTCs/Health Maintenance   Patient is currently controlled on the following medications:   Acetaminophen 500 mg 2 tablets every 6 hrs PRN for mild pain or headache  Latanoprost 0.005% opth soln 1 drop into both eyes HS  Vitamin D 50,000 units 1 capsule twice a week (Monday and Thursday)  Voltaren 1% gel QID  Plan  Continue current medications   Medication Management   Pharmacy/Benefits: [facility] at [address] [place]  Follow up: 1 month office visit   Geraldine Contras, PharmD Clinical Pharmacist [facility] [place] [phone]

## 2019-06-20 ENCOUNTER — Telehealth: Payer: Self-pay | Admitting: Pharmacist

## 2019-06-20 ENCOUNTER — Other Ambulatory Visit: Payer: Self-pay | Admitting: Family Medicine

## 2019-06-20 MED ORDER — HUMALOG KWIKPEN 200 UNIT/ML ~~LOC~~ SOPN: PEN_INJECTOR | SUBCUTANEOUS | 2 refills | Status: DC

## 2019-06-20 NOTE — Telephone Encounter (Signed)
New Rx for Humalog pen sent. Thanks, BJ

## 2019-06-20 NOTE — Telephone Encounter (Cosign Needed)
Received a voicemail from the patient today that she needs her humalog switched from vial to pen due to cost and convenience. Will let Dr. [name] know about it we could send a new rx to [facility] for the pens.

## 2019-06-21 ENCOUNTER — Ambulatory Visit: Payer: Medicare Other | Admitting: Adult Health

## 2019-06-21 ENCOUNTER — Other Ambulatory Visit: Payer: Self-pay

## 2019-06-21 ENCOUNTER — Encounter: Payer: Self-pay | Admitting: Adult Health

## 2019-06-21 VITALS — BP 146/72 | HR 82 | Ht 60.0 in | Wt 135.0 lb

## 2019-06-21 DIAGNOSIS — Z95818 Presence of other cardiac implants and grafts: Secondary | ICD-10-CM

## 2019-06-21 DIAGNOSIS — I10 Essential (primary) hypertension: Secondary | ICD-10-CM | POA: Diagnosis not present

## 2019-06-21 DIAGNOSIS — Z8673 Personal history of transient ischemic attack (TIA), and cerebral infarction without residual deficits: Secondary | ICD-10-CM

## 2019-06-21 DIAGNOSIS — E785 Hyperlipidemia, unspecified: Secondary | ICD-10-CM

## 2019-06-21 NOTE — Patient Instructions (Signed)
Continue clopidogrel 75 mg daily  and atorvastatin and Zetia for secondary stroke prevention  Continue to follow up with PCP regarding cholesterol, blood pressure and diabetes management   Loop recorder will continue to be monitored for possible atrial fibrillation  Maintain strict control of hypertension with blood pressure goal below 130/90, diabetes with hemoglobin A1c goal below 6.5% and cholesterol with LDL cholesterol (bad cholesterol) goal below 70 mg/dL. I also advised the patient to eat a healthy diet with plenty of whole grains, cereals, fruits and vegetables, exercise regularly and maintain ideal body weight.  Overall stable from stroke standpoint and recommend follow-up as needed       Thank you for coming to see [place] at [facility]. I hope we have been able to provide you high quality care today.  You may receive a patient satisfaction survey over the next few weeks. We would appreciate your feedback and comments so that we may continue to improve ourselves and the health of our patients.

## 2019-06-21 NOTE — Progress Notes (Signed)
[facility]  PATIENT: Sarah [name] DOB: 02/01/1945    REASON FOR VISIT: Follow-up for stroke right MCA 10/2016 readmitted 02/08/2018 for right parieto-occipital stroke  HISTORY FROM: Patient  Chief complaint: Chief Complaint  Patient presents with  . Follow-up    Rm 9 here for a stoke f/u.       HPI:  Today, 06/21/2019, Sarah [name] is being seen for stroke follow-up unaccompanied.  She has been stable from a stroke standpoint without new or worsening stroke/TIA symptoms.  Prior complaints of worsening imbalance, left arm numbness/tingling and speech difficulty with MRI and MRA stable without acute abnormalities.  She reports ongoing improvement and continues to do HEP as recommended by therapy at home.  EMG/NCV completed for left arm numbness/tingling with mild bilateral carpal tunnel syndrome but otherwise unremarkable.  Reports left arm symptoms resolved as well as left-sided neck symptoms.  Continues on clopidogrel and atorvastatin for secondary stroke prevention.  Recently added Zetia by PCP due to recent lipid panel showing LDL 115.  Blood pressure today 146/72.  Recent A1c 12.3 with recent adjustments of insulin regimen and continuation of Metformin and Victoza by PCP.  Continues to follow routinely with PCP for HTN, HLD and DM management.  Loop recorder has not shown atrial fibrillation thus far.  No concerns at this time.    History provided for reference purposes only Update 02/13/2019: Sarah [name] is a 74 year old female who is being seen today for stroke follow-up.  She has been stable from a stroke standpoint with residual mild stable left homonymous hemianopsia, mild imbalance which she believes has been slightly worsening over the past couple of months along with possible worsening of prior aphasia from stroke in 2018.  She does admit to limited physical activity and does not have an established exercise regimen along with endorsing increased  stress currently caring for her mother with increased illness and increased stress due to overall COVID-19 pandemic.  She has recently signed up for [employer] program which will be starting in March.  She also states for the past 2 weeks, she has been experiencing left arm numbness/tingling but will resolve quickly and symptoms intermittent.  She does endorse left-sided neck stiffness that radiates into her upper trapezius and increased pain when looking towards the left.  Denies any increased weakness.  Unable to determine if numbness/tingling symptoms occur in certain positions.  She has not previously experienced this sensation.  Denies any other new or worsening neurological symptoms.  She did have bilateral cataract surgery in 03/3823 without complication. Continues on Plavix and atorvastatin for secondary stroke prevention without side effects.  Blood pressure today 130/60.  Continues to follow with PCP for DM management with recent A1c 9.7.  Loop recorder is not shown atrial fibrillation thus far.  Monitoring/battery life will be completed 10/2019.  She has received her first dose of Covid vaccination and plans on receiving second dose next week.  Denies new or worsening stroke/TIA symptoms.  No further concerns at this time.  06/27/2018 VIRTUAL VISIT: Sarah [name] is being seen today for three-month follow-up visit regarding recent stroke.  Initially scheduled for office visit follow-up but due to COVID-19 safety precautions, visit transition to telemedicine via MyChart with patient's consent.  She has been stable from a stroke standpoint with residual left homonymous hemianopsia and balance difficulties.  She continues to participate in outpatient PT with ongoing improvement.  Continues on Plavix and atorvastatin for secondary stroke prevention without reported side effects. Blood pressure  monitored at home.  Lab work obtained yesterday with A1c 10.3 (prior 13.5).  She continues to follow with PCP  for ongoing management.  Loop recorder has not shown atrial fibrillation thus far.  No further concerns at this time.  Denies new or worsening stroke/TIA symptoms.   Hospital follow-up 03/23/2018 CM: Sarah [name], 74 year old female returns for follow-up with readmission for stroke 1/29/ 2020.  MRI of the brain acute cortical and subcortical RPO infarcts.  She initially presented with left hemianopsia.  She is now on Plavix and aspirin for 3 months and then Plavix alone.  She has not had further stroke or TIA symptoms.  She has minimal bruising and no bleeding.  Hemoglobin A1c 13.5 on hospital admission.  She claims she is trying to get that down to a more reasonable level.  She remains on Lipitor without myalgias.  Blood pressure in the office today 146/87.  She continues to go to [facility].  She is to start rehab physical therapy and occupational therapy today.  Loop recorder so far no atrial fibrillation.  She is not driving due to her vision.  She returns for reevaluation  Hospital summary: right parieto-occipital infarcts in setting of progressive R MCA stenosis, infarcts secondary to large vessel disease source  Resultant L hemianopia   CT head R parietal watershed infarct. Small vessel disease.   MRI  Acute cortical and subcortical R P-O infarcts. Old R CR and R frontal lobe infarcts. Small vessel disease.   MRA head  No LVO. Focal stenosis R M1, R M2/M3, L M1  MRA neck B VA origin atherosclerosis 30-50%  2D Echo  pending   Loop interrogation - last check 1/3 w/o AF - interrogation today without AF  LDL 76  HgbA1c 13.5  Lovenox 40 mg sq daily for VTE prophylaxis  clopidogrel 75 mg daily prior to admission, now on aspirin 325 mg daily and clopidogrel 75 mg daily. Continue DAPT x 3 months then plavix alone  Therapy recommendations:  OP OT and PT  Disposition:  Home  Patient advised not to drive      REVIEW OF SYSTEMS: Full 14 system review of systems performed and  notable only for those listed, all others are neg:  Visual impairment, imbalance  ALLERGIES: No Known Allergies  HOME MEDICATIONS:   PAST MEDICAL HISTORY: Past Medical History:  Diagnosis Date  . Bilateral carpal tunnel syndrome 03/27/2019  . Boils   . Glaucoma   . Heart murmur   . HTN (hypertension)   . Hx of adenomatous colonic polyps   . Hyperlipidemia   . Osteoporosis   . Pneumonia   . Stroke ([place])   . Type II or unspecified type diabetes mellitus without mention of complication, uncontrolled     PAST SURGICAL HISTORY: Past Surgical History:  Procedure Laterality Date  . ABDOMINAL HYSTERECTOMY    . CARPAL TUNNEL RELEASE     right  . COLONOSCOPY  12-10-10   per Dr. Deatra Ina, clear, repeat in 7 yrs   . INCISION AND DRAINAGE PERIRECTAL ABSCESS N/A 09/26/2015   Procedure: IRRIGATION AND DEBRIDEMENT PERIRECTAL ABSCESS;  Surgeon: Mickeal Skinner, MD;  Location: [facility];  Service: General;  Laterality: N/A;  . KNEE ARTHROSCOPY     right  . LOOP RECORDER INSERTION N/A 10/19/2016   Procedure: LOOP RECORDER INSERTION;  Surgeon: Thompson Grayer, MD;  Location: [facility];  Service: Cardiovascular;  Laterality: N/A;  . ORIF ANKLE FRACTURE Right 03/24/2012   Procedure: OPEN  REDUCTION INTERNAL FIXATION (ORIF) ANKLE FRACTURE;  Surgeon: Newt Minion, MD;  Location: [facility];  Service: Orthopedics;  Laterality: Right;  Open Reduction Internal Fixation Right Bimalleolar ankle fracture  . POLYPECTOMY    . TEE WITHOUT CARDIOVERSION N/A 10/18/2016   Procedure: TRANSESOPHAGEAL ECHOCARDIOGRAM (TEE);  Surgeon: Fay Records, MD;  Location: [facility] END[facility]COPY;  Service: Cardiovascular;  Laterality: N/A;  . TONSILLECTOMY      FAMILY HISTORY: Family History  Problem Relation Age of Onset  . Diabetes Mother   . Hypertension Mother   . Stroke Father   . Stroke Maternal Uncle   . Stroke Paternal Uncle   . Colon cancer Neg Hx   . Esophageal cancer Neg Hx   . Rectal cancer Neg Hx   . Stomach  cancer Neg Hx     SOCIAL HISTORY: Social History   Socioeconomic History  . Marital status: Single    Spouse name: Not on file  . Number of children: Not on file  . Years of education: Not on file  . Highest education level: Not on file  Occupational History  . Not on file  Tobacco Use  . Smoking status: Former Smoker    Types: Cigarettes    Quit date: 10/15/2016    Years since quitting: 2.6  . Smokeless tobacco: Never Used  . Tobacco comment: smokes occ.   Vaping Use  . Vaping Use: Never used  Substance and Sexual Activity  . Alcohol use: Yes    Alcohol/week: 0.0 standard drinks    Comment: occ  . Drug use: No  . Sexual activity: Not on file  Other Topics Concern  . Not on file  Social History Narrative  . Not on file   Social Determinants of Health   Financial Resource Strain:   . Difficulty of Paying Living Expenses:   Food Insecurity: No Food Insecurity  . Worried About Charity fundraiser in the Last Year: Never true  . Ran Out of Food in the Last Year: Never true  Transportation Needs:   . Lack of Transportation (Medical):   Marland Kitchen Lack of Transportation (Non-Medical):   Physical Activity:   . Days of Exercise per Week:   . Minutes of Exercise per Session:   Stress: Stress Concern Present  . Feeling of Stress : To some extent  Social Connections:   . Frequency of Communication with Friends and Family:   . Frequency of Social Gatherings with Friends and Family:   . Attends Religious Services:   . Active Member of Clubs or Organizations:   . Attends Archivist Meetings:   Marland Kitchen Marital Status:   Intimate Partner Violence:   . Fear of Current or Ex-Partner:   . Emotionally Abused:   Marland Kitchen Physically Abused:   . Sexually Abused:      PHYSICAL EXAM  Today's Vitals   06/21/19 1347  BP: (!) 146/72  Pulse: 82  Weight: 135 lb (61.2 kg)  Height: 5' (1.524 m)   Body mass index is 26.37 kg/m.  General: well developed, well nourished, pleasant elderly  African-American female, seated, in no evident distress Head: head normocephalic and atraumatic.   Neck: supple with no carotid or supraclavicular bruits Cardiovascular: regular rate and rhythm, no murmurs Musculoskeletal: no deformity; tenderness upon palpation of tight left sternocleidomastoid and trapezius  Skin:  no rash/petichiae Vascular:  Normal pulses all extremities   Neurologic Exam Mental Status: Awake and fully alert. Occasional stuttering of speech/speech hesitancy. Oriented to place and  time. Recent and remote memory intact. Attention span, concentration and fund of knowledge appropriate. Mood and affect appropriate.  Cranial Nerves: Pupils equal, briskly reactive to light. Extraocular movements full without nystagmus. Visual fields full to confrontation without evidence of prior left homonymous hemianopia.  Hearing intact. Facial sensation intact. Face, tongue, palate moves normally and symmetrically.  Motor: Normal bulk and tone. Normal strength in all tested extremity muscles. Sensory.: intact to touch , pinprick , position and vibratory sensation.  Coordination: Rapid alternating movements normal in all extremities except slightly decreased left hand dexterity (chronic from prior stroke). Finger-to-nose and heel-to-shin performed accurately bilaterally. Gait and Station: Arises from chair without difficulty. Stance is normal. Gait demonstrates normal stride length and balance without use of assistive device Reflexes: 1+ and symmetric. Toes downgoing.      DIAGNOSTIC DATA (LABS, IMAGING, TESTING) - I reviewed patient records, labs, notes, testing and imaging myself where available.  Lab Results  Component Value Date   WBC 6.3 02/14/2018   HGB 11.7 (L) 02/14/2018   HCT 34.8 (L) 02/14/2018   MCV 92.8 02/14/2018   PLT 377.0 02/14/2018      Component Value Date/Time   NA 137 04/27/2019 1100   K 4.1 04/27/2019 1100   CL 105 04/27/2019 1100   CO2 26 04/27/2019 1100    GLUCOSE 324 (H) 04/27/2019 1100   BUN 13 04/27/2019 1100   CREATININE 0.55 04/27/2019 1100   CALCIUM 9.5 04/27/2019 1100   PROT 6.0 04/27/2019 1100   ALBUMIN 3.4 (L) 04/27/2019 1100   AST 12 04/27/2019 1100   ALT 10 04/27/2019 1100   ALKPHOS 86 04/27/2019 1100   BILITOT 0.3 04/27/2019 1100   GFRNONAA >60 02/08/2018 1143   GFRAA >60 02/08/2018 1143   Lab Results  Component Value Date   CHOL 203 (H) 04/27/2019   HDL 65.10 04/27/2019   LDLCALC 115 (H) 04/27/2019   TRIG 114.0 04/27/2019   CHOLHDL 3 04/27/2019   Lab Results  Component Value Date   HGBA1C 12.3 (A) 04/27/2019        ASSESSMENT AND PLAN    Ms. SHAQUEENA MAUCERI is a 74 y.o. female with history of hypertension, hyperlipidemia, prior right MCA stroke with no gross deficits 2018 but very minimal left hand fine motor deficits, diabetes presenting to the hospital 02/08/2018 with left homonymous hemianopsia, MRI cortical and subcortical RPO infarcts.  Loop recorder placed which has not shown atrial fibrillation thus far.  Residual deficits of subjective visual impairment and ongoing chronic decreased left hand dexterity  Plan:  -continue plavix and lipitor and Zetia for stroke prevention -continue to follow with PCP for HTN, HLD and DM management as well as ongoing prescribing of clopidogrel, statin and Zetia -monitor loop recorder for atrial fibrillation -Continue to monitor blood pressure at home -Continue stay active and maintain a healthy diet - maintain blood pressure goal <130/80, diabetes with hemoglobin A1c goal below 7.0% and lipids with LDL cholesterol goal below 70 mg/dL.   Stable from stroke standpoint recommend follow-up as needed   I spent 22 minutes of face-to-face and non-face-to-face time with patient.  This included previsit chart review, lab review, study review, order entry, electronic health record documentation, patient education   Frann Rider, [facility]  [facility] [address] [place], [place] [address]  Phone [phone] Fax [phone] Note: This document was prepared with digital dictation and possible smart phrase technology. Any transcriptional errors that result from this process are unintentional.

## 2019-06-21 NOTE — Telephone Encounter (Signed)
Patient notified of update  and verbalized understanding. 

## 2019-06-21 NOTE — Telephone Encounter (Signed)
Noted  

## 2019-06-22 ENCOUNTER — Ambulatory Visit: Payer: Medicare Other | Admitting: Physical Therapy

## 2019-06-25 ENCOUNTER — Ambulatory Visit (INDEPENDENT_AMBULATORY_CARE_PROVIDER_SITE_OTHER): Payer: Medicare Other | Admitting: *Deleted

## 2019-06-25 ENCOUNTER — Other Ambulatory Visit: Payer: Self-pay

## 2019-06-25 ENCOUNTER — Ambulatory Visit: Payer: Medicare Other | Admitting: Pharmacist

## 2019-06-25 DIAGNOSIS — E785 Hyperlipidemia, unspecified: Secondary | ICD-10-CM

## 2019-06-25 DIAGNOSIS — I639 Cerebral infarction, unspecified: Secondary | ICD-10-CM

## 2019-06-25 DIAGNOSIS — E1121 Type 2 diabetes mellitus with diabetic nephropathy: Secondary | ICD-10-CM

## 2019-06-25 DIAGNOSIS — I10 Essential (primary) hypertension: Secondary | ICD-10-CM

## 2019-06-25 LAB — CUP PACEART REMOTE DEVICE CHECK
Date Time Interrogation Session: 20210613234101
Implantable Pulse Generator Implant Date: 20181009

## 2019-06-25 NOTE — Patient Instructions (Signed)
Visit Information  Thank you for meeting with me to discuss your medications! I look forward to working with you to achieve your health care goals. Below is a summary of what we talked about during the visit:  Goals Addressed            This Visit's Progress   . Chronic Care Management       CARE PLAN ENTRY  Current Barriers:  . Chronic Disease Management support, education, and care coordination needs related to Hypertension, Hyperlipidemia, and Diabetes   Hypertension . Pharmacist Clinical Goal(s): o Over the next 30 days, patient will work with PharmD and providers to achieve BP goal <130/80 . Current regimen:   Amlodipine 10 mg 2 tablets daily AM  Losartan 50 mg 1 tablet daily AM . Interventions: o Discussed the importance of low salt diet and increase physical activity . Patient self care activities - Over the next 30 days, patient will: o Check BP once daily, document, and provide at future appointments o Ensure daily salt intake < 2300 mg/day o Continue to walk outside 4 days a week and follow weekly recipe  Hyperlipidemia . Pharmacist Clinical Goal(s): o Over the next 30 days, patient will work with PharmD and providers to achieve LDL goal < 100 . Current regimen:   Atorvastatin 80 mg 1 tablet daily HS   Ezetimibe 10 mg 1 tablet daily AM . Interventions: o Discussed the importance of heart healthy diet and increase physical activity . Patient self care activities - Over the next 30 days, patient will: o Continue to walk 4 days a week and follow weekly recipe  Diabetes . Pharmacist Clinical Goal(s): o Over the next 30 days, patient will work with PharmD and providers to achieve A1c goal <7% . Current regimen:   Lantus 100units/mL 35 units AM and 30 units every 12 hrs (breakfast and dinner)  Humalog 100 units/mL kwikpen 5-10 units 30 mins before meals   Metformin 500 mg 1 tablet BID with a meal   Victoza 18mg /73mL inject 1.8 mg daily at  lunch . Interventions: o Encourage to increase physical activity o Benefits of switching from regular soda to diet soda . Patient self care activities - Over the next 30 days, patient will: o Check blood sugar 3-4 times daily, document, and provide at future appointments o Contact provider with any episodes of hypoglycemia  Medication management . Pharmacist Clinical Goal(s): o Over the next 30 days, patient will work with PharmD and providers to maintain optimal medication adherence . Current pharmacy: [facility] . Interventions o Comprehensive medication review performed. o Continue current medication management strategy . Patient self care activities - Over the next 30 days, patient will: o Focus on medication adherence by pill boxes o Take medications as prescribed o Report any questions or concerns to PharmD and/or provider(s)  Initial goal documentation        Sarah [name] was given information about Chronic Care Management services today including:  1. CCM service includes personalized support from designated clinical staff supervised by her physician, including individualized plan of care and coordination with other care providers 2. 24/7 contact phone numbers for assistance for urgent and routine care needs. 3. Standard insurance, coinsurance, copays and deductibles apply for chronic care management only during months in which we provide at least 20 minutes of these services. Most insurances cover these services at 100%, however patients may be responsible for any copay, coinsurance and/or deductible if applicable. This service may help you avoid the  need for more expensive face-to-face services. 4. Only one practitioner may furnish and bill the service in a calendar month. 5. The patient may stop CCM services at any time (effective at the end of the month) by phone call to the office staff.  Patient agreed to services and verbal consent obtained.   The patient verbalized  understanding of instructions provided today and agreed to receive a mailed copy of patient instruction and/or educational materials. Face to Face appointment with pharmacist scheduled for: 07/24/19 at 1 PM . Geraldine Contras, PharmD Clinical Pharmacist [facility] [phone]    Diabetes Mellitus and Nutrition, Adult When you have diabetes (diabetes mellitus), it is very important to have healthy eating habits because your blood sugar (glucose) levels are greatly affected by what you eat and drink. Eating healthy foods in the appropriate amounts, at about the same times every day, can help you:  Control your blood glucose.  Lower your risk of heart disease.  Improve your blood pressure.  Reach or maintain a healthy weight. Every person with diabetes is different, and each person has different needs for a meal plan. Your health care provider may recommend that you work with a diet and nutrition specialist (dietitian) to make a meal plan that is best for you. Your meal plan may vary depending on factors such as:  The calories you need.  The medicines you take.  Your weight.  Your blood glucose, blood pressure, and cholesterol levels.  Your activity level.  Other health conditions you have, such as heart or kidney disease. How do carbohydrates affect me? Carbohydrates, also called carbs, affect your blood glucose level more than any other type of food. Eating carbs naturally raises the amount of glucose in your blood. Carb counting is a method for keeping track of how many carbs you eat. Counting carbs is important to keep your blood glucose at a healthy level, especially if you use insulin or take certain oral diabetes medicines. It is important to know how many carbs you can safely have in each meal. This is different for every person. Your dietitian can help you calculate how many carbs you should have at each meal and for each snack. Foods that contain carbs  include:  Bread, cereal, rice, pasta, and crackers.  Potatoes and corn.  Peas, beans, and lentils.  Milk and yogurt.  Fruit and juice.  Desserts, such as cakes, cookies, ice cream, and candy. How does alcohol affect me? Alcohol can cause a sudden decrease in blood glucose (hypoglycemia), especially if you use insulin or take certain oral diabetes medicines. Hypoglycemia can be a life-threatening condition. Symptoms of hypoglycemia (sleepiness, dizziness, and confusion) are similar to symptoms of having too much alcohol. If your health care provider says that alcohol is safe for you, follow these guidelines:  Limit alcohol intake to no more than 1 drink per day for nonpregnant women and 2 drinks per day for men. One drink equals 12 oz of beer, 5 oz of wine, or 1 oz of hard liquor.  Do not drink on an empty stomach.  Keep yourself hydrated with water, diet soda, or unsweetened iced tea.  Keep in mind that regular soda, juice, and other mixers may contain a lot of sugar and must be counted as carbs. What are tips for following this plan?  Reading food labels  Start by checking the serving size on the "Nutrition Facts" label of packaged foods and drinks. The amount of calories, carbs, fats, and  other nutrients listed on the label is based on one serving of the item. Many items contain more than one serving per package.  Check the total grams (g) of carbs in one serving. You can calculate the number of servings of carbs in one serving by dividing the total carbs by 15. For example, if a food has 30 g of total carbs, it would be equal to 2 servings of carbs.  Check the number of grams (g) of saturated and trans fats in one serving. Choose foods that have low or no amount of these fats.  Check the number of milligrams (mg) of salt (sodium) in one serving. Most people should limit total sodium intake to less than 2,300 mg per day.  Always check the nutrition information of foods labeled  as "low-fat" or "nonfat". These foods may be higher in added sugar or refined carbs and should be avoided.  Talk to your dietitian to identify your daily goals for nutrients listed on the label. Shopping  Avoid buying canned, premade, or processed foods. These foods tend to be high in fat, sodium, and added sugar.  Shop around the outside edge of the grocery store. This includes fresh fruits and vegetables, bulk grains, fresh meats, and fresh dairy. Cooking  Use low-heat cooking methods, such as baking, instead of high-heat cooking methods like deep frying.  Cook using healthy oils, such as olive, canola, or sunflower oil.  Avoid cooking with butter, cream, or high-fat meats. Meal planning  Eat meals and snacks regularly, preferably at the same times every day. Avoid going long periods of time without eating.  Eat foods high in fiber, such as fresh fruits, vegetables, beans, and whole grains. Talk to your dietitian about how many servings of carbs you can eat at each meal.  Eat 4-6 ounces (oz) of lean protein each day, such as lean meat, chicken, fish, eggs, or tofu. One oz of lean protein is equal to: ? 1 oz of meat, chicken, or fish. ? 1 egg. ?  cup of tofu.  Eat some foods each day that contain healthy fats, such as avocado, nuts, seeds, and fish. Lifestyle  Check your blood glucose regularly.  Exercise regularly as told by your health care provider. This may include: ? 150 minutes of moderate-intensity or vigorous-intensity exercise each week. This could be brisk walking, biking, or water aerobics. ? Stretching and doing strength exercises, such as yoga or weightlifting, at least 2 times a week.  Take medicines as told by your health care provider.  Do not use any products that contain nicotine or tobacco, such as cigarettes and e-cigarettes. If you need help quitting, ask your health care provider.  Work with a Social worker or diabetes educator to identify strategies to  manage stress and any emotional and social challenges. Questions to ask a health care provider  Do I need to meet with a diabetes educator?  Do I need to meet with a dietitian?  What number can I call if I have questions?  When are the best times to check my blood glucose? Where to find more information:  American Diabetes Association: diabetes.org  Academy of Nutrition and Dietetics: www.eatright.[employer] of Diabetes and Digestive and Kidney Diseases (NIH): [employer] Summary  A healthy meal plan will help you control your blood glucose and maintain a healthy lifestyle.  Working with a diet and nutrition specialist (dietitian) can help you make a meal plan that is best for you.  Keep in mind that  carbohydrates (carbs) and alcohol have immediate effects on your blood glucose levels. It is important to count carbs and to use alcohol carefully. This information is not intended to replace advice given to you by your health care provider. Make sure you discuss any questions you have with your health care provider. Document Revised: 12/10/2016 Document Reviewed: 02/02/2016 Elsevier Patient Education  2020 [employer].

## 2019-06-25 NOTE — Progress Notes (Signed)
I agree with the above plan 

## 2019-06-26 ENCOUNTER — Encounter: Payer: Medicare Other | Admitting: Speech Pathology

## 2019-06-26 ENCOUNTER — Ambulatory Visit: Payer: Medicare Other | Admitting: Physical Therapy

## 2019-06-26 NOTE — Progress Notes (Signed)
[facility] Summary Report / Loop Recorder 

## 2019-06-28 ENCOUNTER — Ambulatory Visit: Payer: Medicare Other | Admitting: Physical Therapy

## 2019-07-03 ENCOUNTER — Encounter: Payer: Medicare Other | Admitting: Speech Pathology

## 2019-07-03 ENCOUNTER — Ambulatory Visit: Payer: Medicare Other | Admitting: Physical Therapy

## 2019-07-05 ENCOUNTER — Ambulatory Visit: Payer: Medicare Other | Admitting: Physical Therapy

## 2019-07-10 ENCOUNTER — Encounter: Payer: Medicare Other | Admitting: Speech Pathology

## 2019-07-10 ENCOUNTER — Ambulatory Visit: Payer: Medicare Other | Admitting: Physical Therapy

## 2019-07-12 ENCOUNTER — Ambulatory Visit: Payer: Medicare Other | Admitting: Physical Therapy

## 2019-07-16 ENCOUNTER — Other Ambulatory Visit: Payer: Self-pay | Admitting: Family Medicine

## 2019-07-24 ENCOUNTER — Ambulatory Visit: Payer: Medicare Other

## 2019-07-24 NOTE — Chronic Care Management (AMB) (Deleted)
Chronic Care Management Pharmacy  Name: Sarah [name]  MRN: [id number] DOB: [birth year]  Chief Complaint/ HPI  Sarah [name],  74 y.o. , female presents for their Follow-Up CCM visit with the clinical pharmacist via telephone due to COVID-19 Pandemic. Patient is doing well and finally got started on her humalog pens. She mentions starting it over the weekend. She was sent humalog vials a month before and could not really use it. Patient reports waling 4 times a week now and follows weekly food schedule as a reference for diabetic diet.  PCP : [name], [name], MD  Their chronic conditions include: HTN, HLD, type 2 diabetes, CKD stage 2, GERD, hx of stroke  Office Visits: 04/27/19 OV - Chronic dx management. Unconrolled DM. Started on humalog 5-10 units 15 mins before meals TID. Lantus increased to 35 units in the morning. Amlodipine increased to 10 mg from 7.5 mg. F/u in 3-4 months  Consult Visit: 06/21/19 OV ([name], Neuro) - continue atorvastatin and zetia for stroke prevention. No changes with meds. Patient stable from stroke standpoint.   03/27/19 OV (Willis, Neuro) - Numbness and tingling in left arm. Evidence of mild bilateral carpal tunnel syndrome.  02/13/19 OV ([name], Neuro) - 6 mon f/u visit for MCA 10/18 and 1/20 right parieto-occipital stroke. No changes with medications. F/u in 4 months.  Medications: Outpatient Encounter Medications as of 07/24/2019  Medication Sig Note  . acetaminophen (TYLENOL) 500 MG tablet Take 1,000 mg by mouth every 6 (six) hours as needed for mild pain or headache.   Marland Kitchen amLODipine (NORVASC) 10 MG tablet Take 1 tablet (10 mg total) by mouth daily. (Patient taking differently: Take 10 mg by mouth daily. Pt reports taking 2 10 mg tablets)   . atorvastatin (LIPITOR) 80 MG tablet Take 1 tablet (80 mg total) by mouth daily at 6 PM.   . Blood Glucose Calibration (OT ULTRA/FASTTK CNTRL SOLN) SOLN    . calcium carbonate (TUMS EX) 750 MG chewable tablet  Chew 1 tablet by mouth as needed for heartburn.   . clopidogrel (PLAVIX) 75 MG tablet TAKE 1 TABLET BY MOUTH DAILY   . diclofenac Sodium (VOLTAREN) 1 % GEL Apply 2 g topically 4 (four) times daily.   Marland Kitchen ezetimibe (ZETIA) 10 MG tablet Take 1 tablet (10 mg total) by mouth daily.   . famotidine (PEPCID) 40 MG tablet TAKE 1 TABLET(40 MG) BY MOUTH AT BEDTIME 12/25/2018: Takes as needed.  . Insulin Glargine (LANTUS SOLOSTAR) 100 UNIT/ML Solostar Pen Inject 30 Units into the skin 2 (two) times daily.   . insulin lispro (HUMALOG KWIKPEN) 200 UNIT/ML KwikPen 5-10 U 15 min before meals   . Insulin Pen Needle (BD PEN NEEDLE NANO U/F) 32G X 4 MM MISC USE UP TO FOUR TIMES DAILY   . Lancet Devices (SIMPLE DIAGNOSTICS LANCING DEV) MISC Check BS before and 2 hours after meals.   . latanoprost (XALATAN) 0.005 % ophthalmic solution Place 1 drop into both eyes at bedtime.   . liraglutide (VICTOZA) 18 MG/3ML SOPN Inject 0.3 mLs (1.8 mg total) into the skin daily.   Marland Kitchen losartan (COZAAR) 50 MG tablet TAKE 1 TABLET(50 MG) BY MOUTH DAILY   . metFORMIN (GLUCOPHAGE) 500 MG tablet Take 1 tablet (500 mg total) by mouth 2 (two) times daily with a meal.   . omeprazole (PRILOSEC) 20 MG capsule Take 1 capsule (20 mg total) by mouth daily.   . ONE TOUCH ULTRA TEST test strip Check BS before and 2  hours after meals.   Marland Kitchen PHARMACIST CHOICE ALCOHOL 70 % PADS    . ROCKLATAN 0.02-0.005 % SOLN Place 1 drop into both eyes every evening.   . Vitamin D, Ergocalciferol, (DRISDOL) 1.25 MG (50000 UNIT) CAPS capsule TAKE ONE CAPSULE BY MOUTH TWICE A WEEK( MONDAY AND THURSDAY)    No facility-administered encounter medications on file as of 07/24/2019.     Food Insecurity: No Food Insecurity  . Worried About Charity fundraiser in the Last Year: Never true  . Ran Out of Food in the Last Year: Never true     Stress: Stress Concern Present  . Feeling of Stress : To some extent   Current Diagnosis/Assessment:  Goals Addressed   None      Hypertension   Office blood pressures are  BP Readings from Last 3 Encounters:  06/21/19 (!) 146/72  04/27/19 132/80  02/13/19 130/60   Patient has failed these meds in the past: lisinopril, valsartan Patient is currently uncontrolled on the following medications:   Amlodipine 10 mg 2 tablets daily AM  Losartan 50 mg 1 tablet daily AM  Patient continues her daily walks. Tries to do walking 4 times a week within their compound. Still uses salt alternative for seasoning. Will have f/u visit with Dr. [name] on August. Advised to continue monitoring blood pressure at least once a day. Blood pressure was high during the last visit.  Plan  Continue current medications and control with diet and exercise     Hyperlipidemia   Lipid Panel     Component Value Date/Time   CHOL 203 (H) 04/27/2019 1100   TRIG 114.0 04/27/2019 1100   HDL 65.10 04/27/2019 1100   CHOLHDL 3 04/27/2019 1100   VLDL 22.8 04/27/2019 1100   LDLCALC 115 (H) 04/27/2019 1100     The ASCVD Risk score (Goff DC Jr., et al., 2013) failed to calculate for the following reasons:   The patient has a prior MI or stroke diagnosis   Patient has failed these meds in past: Simvastatin Patient is currently uncontrolled on the following medications:  Atorvastatin 80 mg 1 tablet daily HS   Ezetimibe 10 mg 1 tablet daily AM  Patient reports walking at least 4 days a week and tries to follow a weekly recipe for her diet. Will have f/u visit with Dr. [name] on August. She just had a stroke f/u on 06/21/19. Patient doing better with no changes on meds.   Plan  Continue current medications and control with diet and exercise  Diabetes   Recent Relevant Labs: Lab Results  Component Value Date/Time   HGBA1C 12.3 (A) 04/27/2019 10:31 AM   HGBA1C 9.7 (A) 11/27/2018 11:57 AM   HGBA1C 10.3 (H) 06/26/2018 10:27 AM   HGBA1C 13.5 (H) 12/28/2017 10:43 AM   GFR 131.56 04/27/2019 11:00 AM   GFR 147.03 11/27/2018 11:58 AM    MICROALBUR 96.5 (H) 04/27/2019 11:00 AM   MICROALBUR 326.6 (H) 11/27/2018 11:58 AM     Patient has failed these meds in past: Toujeo, novolog, trulicity Patient is currently uncontrolled on the following medications:   Lantus 100units/mL 35 units AM and 30 units every 12 hrs (breakfast and dinner)  Humalog 100 units/mL kwikpen 5-10 units 30 mins before meals   Metformin 500 mg 1 tablet BID with a meal   Victoza 18mg /66mL inject 1.8 mg daily at lunch  Last diabetic Eye exam:  Lab Results  Component Value Date/Time   HMDIABEYEEXA No Retinopathy 07/22/2015 12:00 AM  Last diabetic Foot exam: No results found for: HMDIABFOOTEX   Patient just recently started using humalog pens and reports drinking diet soda instead of regular. Patient also reports walking 4 days a week around her area since there no more pollen and it is much nicer to go outside. Advised to f/u with me after a month to check on her progress. Patient will have f/u visit with Dr. [name] on August. Advised patient to take humalog 30 mins before meals and take lantus every morning and dinner. Also, inject victoza at lunch time.   Plan  Continue current medications and control with diet and exercise  Follow-up after a month   GERD   Patient has failed these meds in past: None Patient is currently controlled on the following medications:   Calcium carbonate 750 mg 1 tablet PRN  Patient has not had any heartburns or upset stomach lately. Well controlled on tums Plan  Continue current medications   Hx of Cerebral Infacrtion   CVA x 2  Patient has failed these meds in past: None Patient is currently controlled on the following medications:   Clopidogrel 75 mg 1 tablet daily  Atorvastatin 80 mg 1 tablet daily HS   Patient denies uncontrolled bleeding or bruising lately. Educated her to do things slowly and reach out if she notices any severe bruising or bleeding  Plan  Continue current medications     OTCs/Health Maintenance   Patient is currently controlled on the following medications:   Acetaminophen 500 mg 2 tablets every 6 hrs PRN for mild pain or headache  Latanoprost 0.005% opth soln 1 drop into both eyes HS  Vitamin D 50,000 units 1 capsule twice a week (Monday and Thursday)  Voltaren 1% gel QID  Plan  Continue current medications   Medication Management   Pharmacy/Benefits: [facility] at [address]  Follow up: 1 month office visit   Geraldine Contras, PharmD Clinical Pharmacist [facility] [place] [phone]

## 2019-07-30 ENCOUNTER — Ambulatory Visit (INDEPENDENT_AMBULATORY_CARE_PROVIDER_SITE_OTHER): Payer: Medicare Other | Admitting: *Deleted

## 2019-07-30 DIAGNOSIS — I639 Cerebral infarction, unspecified: Secondary | ICD-10-CM | POA: Diagnosis not present

## 2019-07-30 LAB — CUP PACEART REMOTE DEVICE CHECK
Date Time Interrogation Session: 20210718230516
Implantable Pulse Generator Implant Date: 20181009

## 2019-08-01 ENCOUNTER — Ambulatory Visit (INDEPENDENT_AMBULATORY_CARE_PROVIDER_SITE_OTHER): Payer: Medicare Other | Admitting: Family Medicine

## 2019-08-01 ENCOUNTER — Other Ambulatory Visit: Payer: Self-pay

## 2019-08-01 ENCOUNTER — Encounter: Payer: Self-pay | Admitting: Family Medicine

## 2019-08-01 VITALS — BP 146/82 | HR 64 | Temp 98.6°F | Wt 136.0 lb

## 2019-08-01 DIAGNOSIS — R350 Frequency of micturition: Secondary | ICD-10-CM

## 2019-08-01 DIAGNOSIS — E1165 Type 2 diabetes mellitus with hyperglycemia: Secondary | ICD-10-CM | POA: Diagnosis not present

## 2019-08-01 DIAGNOSIS — IMO0002 Reserved for concepts with insufficient information to code with codable children: Secondary | ICD-10-CM

## 2019-08-01 DIAGNOSIS — R1030 Lower abdominal pain, unspecified: Secondary | ICD-10-CM

## 2019-08-01 DIAGNOSIS — Z794 Long term (current) use of insulin: Secondary | ICD-10-CM

## 2019-08-01 LAB — POC URINALSYSI DIPSTICK (AUTOMATED)
Bilirubin, UA: NEGATIVE
Blood, UA: NEGATIVE
Glucose, UA: NEGATIVE
Ketones, UA: NEGATIVE
Leukocytes, UA: NEGATIVE
Nitrite, UA: NEGATIVE
Protein, UA: POSITIVE — AB
Spec Grav, UA: >=1.03 — AB (ref 1.010–1.025)
Urobilinogen, UA: 0.2 E.U./dL
pH, UA: 6 (ref 5.0–8.0)

## 2019-08-01 NOTE — Progress Notes (Signed)
[facility] Summary Report / Loop Recorder 

## 2019-08-01 NOTE — Patient Instructions (Signed)
Urinary Frequency, Adult Urinary frequency means urinating more often than usual. You may urinate every 1-2 hours even though you drink a normal amount of fluid and do not have a bladder infection or condition. Although you urinate more often than normal, the total amount of urine produced in a day is normal. With urinary frequency, you may have an urgent need to urinate often. The stress and anxiety of needing to find a bathroom quickly can make this urge worse. This condition may go away on its own or you may need treatment at home. Home treatment may include bladder training, exercises, taking medicines, or making changes to your diet. Follow these instructions at home: Bladder health   Keep a bladder diary if told by your health care provider. Keep track of: ? What you eat and drink. ? How often you urinate. ? How much you urinate.  Follow a bladder training program if told by your health care provider. This may include: ? Learning to delay going to the bathroom. ? Double urinating (voiding). This helps if you are not completely emptying your bladder. ? Scheduled voiding.  Do Kegel exercises as told by your health care provider. Kegel exercises strengthen the muscles that help control urination, which may help the condition. Eating and drinking  If told by your health care provider, make diet changes, such as: ? Avoiding caffeine. ? Drinking fewer fluids, especially alcohol. ? Not drinking in the evening. ? Avoiding foods or drinks that may irritate the bladder. These include coffee, tea, soda, artificial sweeteners, citrus, tomato-based foods, and chocolate. ? Eating foods that help prevent or ease constipation. Constipation can make this condition worse. Your health care provider may recommend that you:  Drink enough fluid to keep your urine pale yellow.  Take over-the-counter or prescription medicines.  Eat foods that are high in fiber, such as beans, whole grains, and fresh  fruits and vegetables.  Limit foods that are high in fat and processed sugars, such as fried or sweet foods. General instructions  Take over-the-counter and prescription medicines only as told by your health care provider.  Keep all follow-up visits as told by your health care provider. This is important. Contact a health care provider if:  You start urinating more often.  You feel pain or irritation when you urinate.  You notice blood in your urine.  Your urine looks cloudy.  You develop a fever.  You begin vomiting. Get help right away if:  You are unable to urinate. Summary  Urinary frequency means urinating more often than usual. With urinary frequency, you may urinate every 1-2 hours even though you drink a normal amount of fluid and do not have a bladder infection or other bladder condition.  Your health care provider may recommend that you keep a bladder diary, follow a bladder training program, or make dietary changes.  If told by your health care provider, do Kegel exercises to strengthen the muscles that help control urination.  Take over-the-counter and prescription medicines only as told by your health care provider.  Contact a health care provider if your symptoms do not improve or get worse. This information is not intended to replace advice given to you by your health care provider. Make sure you discuss any questions you have with your health care provider. Document Revised: 07/07/2017 Document Reviewed: 07/07/2017 Elsevier Patient Education  [place].  Abdominal Pain, Adult Many things can cause belly (abdominal) pain. Most times, belly pain is not dangerous. Many cases of  belly pain can be watched and treated at home. Sometimes, though, belly pain is serious. Your doctor will try to find the cause of your belly pain. Follow these instructions at home:  Medicines  Take over-the-counter and prescription medicines only as told by your  doctor.  Do not take medicines that help you poop (laxatives) unless told by your doctor. General instructions  Watch your belly pain for any changes.  Drink enough fluid to keep your pee (urine) pale yellow.  Keep all follow-up visits as told by your doctor. This is important. Contact a doctor if:  Your belly pain changes or gets worse.  You are not hungry, or you lose weight without trying.  You are having trouble pooping (constipated) or have watery poop (diarrhea) for more than 2-3 days.  You have pain when you pee or poop.  Your belly pain wakes you up at night.  Your pain gets worse with meals, after eating, or with certain foods.  You are vomiting and cannot keep anything down.  You have a fever.  You have blood in your pee. Get help right away if:  Your pain does not go away as soon as your doctor says it should.  You cannot stop vomiting.  Your pain is only in areas of your belly, such as the right side or the left lower part of the belly.  You have bloody or black poop, or poop that looks like tar.  You have very bad pain, cramping, or bloating in your belly.  You have signs of not having enough fluid or water in your body (dehydration), such as: ? Dark pee, very little pee, or no pee. ? Cracked lips. ? Dry mouth. ? Sunken eyes. ? Sleepiness. ? Weakness.  You have trouble breathing or chest pain. Summary  Many cases of belly pain can be watched and treated at home.  Watch your belly pain for any changes.  Take over-the-counter and prescription medicines only as told by your doctor.  Contact a doctor if your belly pain changes or gets worse.  Get help right away if you have very bad pain, cramping, or bloating in your belly. This information is not intended to replace advice given to you by your health care provider. Make sure you discuss any questions you have with your health care provider. Document Revised: 05/08/2018 Document Reviewed:  05/08/2018 Elsevier Patient Education  [place].  Diabetes Mellitus and Nutrition, Adult When you have diabetes (diabetes mellitus), it is very important to have healthy eating habits because your blood sugar (glucose) levels are greatly affected by what you eat and drink. Eating healthy foods in the appropriate amounts, at about the same times every day, can help you:  Control your blood glucose.  Lower your risk of heart disease.  Improve your blood pressure.  Reach or maintain a healthy weight. Every person with diabetes is different, and each person has different needs for a meal plan. Your health care provider may recommend that you work with a diet and nutrition specialist (dietitian) to make a meal plan that is best for you. Your meal plan may vary depending on factors such as:  The calories you need.  The medicines you take.  Your weight.  Your blood glucose, blood pressure, and cholesterol levels.  Your activity level.  Other health conditions you have, such as heart or kidney disease. How do carbohydrates affect me? Carbohydrates, also called carbs, affect your blood glucose level more than any other type  of food. Eating carbs naturally raises the amount of glucose in your blood. Carb counting is a method for keeping track of how many carbs you eat. Counting carbs is important to keep your blood glucose at a healthy level, especially if you use insulin or take certain oral diabetes medicines. It is important to know how many carbs you can safely have in each meal. This is different for every person. Your dietitian can help you calculate how many carbs you should have at each meal and for each snack. Foods that contain carbs include:  Bread, cereal, rice, pasta, and crackers.  Potatoes and corn.  Peas, beans, and lentils.  Milk and yogurt.  Fruit and juice.  Desserts, such as cakes, cookies, ice cream, and candy. How does alcohol affect me? Alcohol can  cause a sudden decrease in blood glucose (hypoglycemia), especially if you use insulin or take certain oral diabetes medicines. Hypoglycemia can be a life-threatening condition. Symptoms of hypoglycemia (sleepiness, dizziness, and confusion) are similar to symptoms of having too much alcohol. If your health care provider says that alcohol is safe for you, follow these guidelines:  Limit alcohol intake to no more than 1 drink per day for nonpregnant women and 2 drinks per day for men. One drink equals 12 oz of beer, 5 oz of wine, or 1 oz of hard liquor.  Do not drink on an empty stomach.  Keep yourself hydrated with water, diet soda, or unsweetened iced tea.  Keep in mind that regular soda, juice, and other mixers may contain a lot of sugar and must be counted as carbs. What are tips for following this plan?  Reading food labels  Start by checking the serving size on the "Nutrition Facts" label of packaged foods and drinks. The amount of calories, carbs, fats, and other nutrients listed on the label is based on one serving of the item. Many items contain more than one serving per package.  Check the total grams (g) of carbs in one serving. You can calculate the number of servings of carbs in one serving by dividing the total carbs by 15. For example, if a food has 30 g of total carbs, it would be equal to 2 servings of carbs.  Check the number of grams (g) of saturated and trans fats in one serving. Choose foods that have low or no amount of these fats.  Check the number of milligrams (mg) of salt (sodium) in one serving. Most people should limit total sodium intake to less than 2,300 mg per day.  Always check the nutrition information of foods labeled as "low-fat" or "nonfat". These foods may be higher in added sugar or refined carbs and should be avoided.  Talk to your dietitian to identify your daily goals for nutrients listed on the label. Shopping  Avoid buying canned, premade, or  processed foods. These foods tend to be high in fat, sodium, and added sugar.  Shop around the outside edge of the grocery store. This includes fresh fruits and vegetables, bulk grains, fresh meats, and fresh dairy. Cooking  Use low-heat cooking methods, such as baking, instead of high-heat cooking methods like deep frying.  Cook using healthy oils, such as olive, canola, or sunflower oil.  Avoid cooking with butter, cream, or high-fat meats. Meal planning  Eat meals and snacks regularly, preferably at the same times every day. Avoid going long periods of time without eating.  Eat foods high in fiber, such as fresh fruits, vegetables, beans, and  whole grains. Talk to your dietitian about how many servings of carbs you can eat at each meal.  Eat 4-6 ounces (oz) of lean protein each day, such as lean meat, chicken, fish, eggs, or tofu. One oz of lean protein is equal to: ? 1 oz of meat, chicken, or fish. ? 1 egg. ?  cup of tofu.  Eat some foods each day that contain healthy fats, such as avocado, nuts, seeds, and fish. Lifestyle  Check your blood glucose regularly.  Exercise regularly as told by your health care provider. This may include: ? 150 minutes of moderate-intensity or vigorous-intensity exercise each week. This could be brisk walking, biking, or water aerobics. ? Stretching and doing strength exercises, such as yoga or weightlifting, at least 2 times a week.  Take medicines as told by your health care provider.  Do not use any products that contain nicotine or tobacco, such as cigarettes and e-cigarettes. If you need help quitting, ask your health care provider.  Work with a Social worker or diabetes educator to identify strategies to manage stress and any emotional and social challenges. Questions to ask a health care provider  Do I need to meet with a diabetes educator?  Do I need to meet with a dietitian?  What number can I call if I have questions?  When are the  best times to check my blood glucose? Where to find more information:  American Diabetes Association: diabetes.org  Academy of Nutrition and Dietetics: www.eatright.[employer] of Diabetes and Digestive and Kidney Diseases (NIH): [employer] Summary  A healthy meal plan will help you control your blood glucose and maintain a healthy lifestyle.  Working with a diet and nutrition specialist (dietitian) can help you make a meal plan that is best for you.  Keep in mind that carbohydrates (carbs) and alcohol have immediate effects on your blood glucose levels. It is important to count carbs and to use alcohol carefully. This information is not intended to replace advice given to you by your health care provider. Make sure you discuss any questions you have with your health care provider. Document Revised: 12/10/2016 Document Reviewed: 02/02/2016 Elsevier Patient Education  2020 [place].  Diabetes Mellitus and Standards of Medical Care Managing diabetes (diabetes mellitus) can be complicated. Your diabetes treatment may be managed by a team of health care providers, including:  A physician who specializes in diabetes (endocrinologist).  A nurse practitioner or physician assistant.  Nurses.  A diet and nutrition specialist (registered dietitian).  A certified diabetes educator (CDE).  An exercise specialist.  A pharmacist.  An eye doctor.  A foot specialist (podiatrist).  A dentist.  A primary care provider.  A mental health provider. Your health care providers follow guidelines to help you get the best quality of care. The following schedule is a general guideline for your diabetes management plan. Your health care providers may give you more specific instructions. Physical exams Upon being diagnosed with diabetes mellitus, and each year after that, your health care provider will ask about your medical and family history. He or she will also do a  physical exam. Your exam may include:  Measuring your height, weight, and body mass index (BMI).  Checking your blood pressure. This will be done at every routine medical visit. Your target blood pressure may vary depending on your medical conditions, your age, and other factors.  Thyroid gland exam.  Skin exam.  Screening for damage to your nerves (peripheral neuropathy). This may include  checking the pulse in your legs and feet and checking the level of sensation in your hands and feet.  A complete foot exam to inspect the structure and skin of your feet, including checking for cuts, bruises, redness, blisters, sores, or other problems.  Screening for blood vessel (vascular) problems, which may include checking the pulse in your legs and feet and checking your temperature. Blood tests Depending on your treatment plan and your personal needs, you may have the following tests done:  HbA1c (hemoglobin A1c). This test provides information about blood sugar (glucose) control over the previous 2-3 months. It is used to adjust your treatment plan, if needed. This test will be done: ? At least 2 times a year, if you are meeting your treatment goals. ? 4 times a year, if you are not meeting your treatment goals or if treatment goals have changed.  Lipid testing, including total, LDL, and HDL cholesterol and triglyceride levels. ? The goal for LDL is less than 100 mg/dL (5.5 mmol/L). If you are at high risk for complications, the goal is less than 70 mg/dL (3.9 mmol/L). ? The goal for HDL is 40 mg/dL (2.2 mmol/L) or higher for men and 50 mg/dL (2.8 mmol/L) or higher for women. An HDL cholesterol of 60 mg/dL (3.3 mmol/L) or higher gives some protection against heart disease. ? The goal for triglycerides is less than 150 mg/dL (8.3 mmol/L).  Liver function tests.  Kidney function tests.  Thyroid function tests. Dental and eye exams  Visit your dentist two times a year.  If you have type 1  diabetes, your health care provider may recommend an eye exam 3-5 years after you are diagnosed, and then once a year after your first exam. ? For children with type 1 diabetes, a health care provider may recommend an eye exam when your child is age 48 or older and has had diabetes for 3-5 years. After the first exam, your child should get an eye exam once a year.  If you have type 2 diabetes, your health care provider may recommend an eye exam as soon as you are diagnosed, and then once a year after your first exam. Immunizations   The yearly flu (influenza) vaccine is recommended for everyone 6 months or older who has diabetes.  The pneumonia (pneumococcal) vaccine is recommended for everyone 2 years or older who has diabetes. If you are 79 or older, you may get the pneumonia vaccine as a series of two separate shots.  The hepatitis B vaccine is recommended for adults shortly after being diagnosed with diabetes.  Adults and children with diabetes should receive all other vaccines according to age-specific recommendations from the Centers for Disease Control and Prevention (CDC). Mental and emotional health Screening for symptoms of eating disorders, anxiety, and depression is recommended at the time of diagnosis and afterward as needed. If your screening shows that you have symptoms (positive screening result), you may need more evaluation and you may work with a mental health care provider. Treatment plan Your treatment plan will be reviewed at every medical visit. You and your health care provider will discuss:  How you are taking your medicines, including insulin.  Any side effects you are experiencing.  Your blood glucose target goals.  The frequency of your blood glucose monitoring.  Lifestyle habits, such as activity level as well as tobacco, alcohol, and substance use. Diabetes self-management education Your health care provider will assess how well you are monitoring your blood  glucose  levels and whether you are taking your insulin correctly. He or she may refer you to:  A certified diabetes educator to manage your diabetes throughout your life, starting at diagnosis.  A registered dietitian who can create or review your personal nutrition plan.  An exercise specialist who can discuss your activity level and exercise plan. Summary  Managing diabetes (diabetes mellitus) can be complicated. Your diabetes treatment may be managed by a team of health care providers.  Your health care providers follow guidelines in order to help you get the best quality of care.  Standards of care including having regular physical exams, blood tests, blood pressure monitoring, immunizations, screening tests, and education about how to manage your diabetes.  Your health care providers may also give you more specific instructions based on your individual health. This information is not intended to replace advice given to you by your health care provider. Make sure you discuss any questions you have with your health care provider. Document Revised: 09/16/2017 Document Reviewed: 09/26/2015 Elsevier Patient Education  [place].

## 2019-08-01 NOTE — Progress Notes (Signed)
Subjective:    Patient ID: Sarah [name], female    DOB: 1945/04/28, 74 y.o.   MRN: [id number]  No chief complaint on file.   HPI Pt is a 74 yo female with pmh sig for GERD, HTN, carotid artery stenosis, DM 2 with nephropathy, history of CVA, osteopenia, OA, CKD stage II, tobacco use who was is by Dr. Patsy Lager, here for acute concern.  Pt endorses urinary frequency, low back pain x2 weeks.  Denies dysuria, n/v, constipation, flank pain, or pain that moves.  May have a BM every 3 days.  Notes increased thirst.  Pt notes blood sugar at home has been 200-300.  Taking Lantus 30 units twice daily, Humalog 10 units 3 times daily, and metformin 500 mg twice daily, Victoza 1.8 mg daily.  Pt notes drinking 1-2 cans of soda, orange juice in a.m., and water.  Pt taking care of her 83 yo mother and cooks what she thinks her mom will eat.  Past Medical History:  Diagnosis Date  . Bilateral carpal tunnel syndrome 03/27/2019  . Boils   . Glaucoma   . Heart murmur   . HTN (hypertension)   . Hx of adenomatous colonic polyps   . Hyperlipidemia   . Osteoporosis   . Pneumonia   . Stroke ([place])   . Type II or unspecified type diabetes mellitus without mention of complication, uncontrolled     No Known Allergies  ROS General: Denies fever, chills, night sweats, changes in weight, changes in appetite + increased thirst HEENT: Denies headaches, ear pain, changes in vision, rhinorrhea, sore throat CV: Denies CP, palpitations, SOB, orthopnea Pulm: Denies SOB, cough, wheezing GI: Denies abdominal pain, nausea, vomiting, diarrhea, constipation GU: Denies dysuria, hematuria, vaginal discharge + frequency Msk: Denies muscle cramps, joint pains + low back pain Neuro: Denies weakness, numbness, tingling Skin: Denies rashes, bruising Psych: Denies depression, anxiety, hallucinations     Objective:    Blood pressure (!) 146/82, pulse 64, temperature 98.6 F (37 C), temperature source Oral, weight 136 lb  (61.7 kg), SpO2 97 %.   Gen. Pleasant, well-nourished, in no distress, normal affect   HEENT: [place]/AT, face symmetric, conjunctiva clear, no scleral icterus, PERRLA, EOMI, nares patent without drainage Lungs: no accessory muscle use, CTAB, no wheezes or rales Cardiovascular: RRR, no m/r/g, no peripheral edema Abdomen: BS present, soft, TTP of left and right lower quadrant, ND, no hepatosplenomegaly.    Musculoskeletal:No TTP of cervical, thoracic, lumbar, paraspinal muscles.  No CVA tenderness.  No deformities, no cyanosis or clubbing, normal tone Neuro:  A&Ox3, CN II-XII intact, normal gait Skin:  Warm, no lesions/ rash   Wt Readings from Last 3 Encounters:  08/01/19 136 lb (61.7 kg)  06/21/19 135 lb (61.2 kg)  04/27/19 136 lb 8 oz (61.9 kg)    Lab Results  Component Value Date   WBC 6.3 02/14/2018   HGB 11.7 (L) 02/14/2018   HCT 34.8 (L) 02/14/2018   PLT 377.0 02/14/2018   GLUCOSE 324 (H) 04/27/2019   CHOL 203 (H) 04/27/2019   TRIG 114.0 04/27/2019   HDL 65.10 04/27/2019   LDLCALC 115 (H) 04/27/2019   ALT 10 04/27/2019   AST 12 04/27/2019   NA 137 04/27/2019   K 4.1 04/27/2019   CL 105 04/27/2019   CREATININE 0.55 04/27/2019   BUN 13 04/27/2019   CO2 26 04/27/2019   TSH 1.15 03/06/2015   INR 0.95 10/15/2016   HGBA1C 12.3 (A) 04/27/2019   MICROALBUR 96.5 (H) 04/27/2019  Assessment/Plan:  Urinary frequency -UA with 3+ protein, SG 1.030, no glucose.  Negative for infection -Pt encouraged to increase p.o. intake of water and fluids -Frequency likely 2/2 hyperglycemia as last hemoglobin A1c 12.3% on 04/27/2019 -Given precautions -Given handout - Plan: POCT Urinalysis Dipstick (Automated)  Uncontrolled type 2 diabetes mellitus with insulin therapy ([facility])  -Last hemoglobin A1c 12.3% on 04/27/2019 -Discussed the importance of lifestyle modifications -Continue current medications: Lantus 30 units twice daily, Humalog 10 units 3 times daily, Victoza 1.8 mg daily,  Metformin 500 mg twice daily -Patient encouraged to take her insulin regularly and keep a log of her blood sugars so now how to properly adjust her insulin.  Rotate injection sites. -Pt encouraged to follow-up with PCP - Plan: Fructosamine, Hemoglobin A1c, Hemoglobin A1c  Lower abdominal pain -Discussed possible causes including constipation, UTI, diverticulitis -We will obtain labs -Discussed supportive care -Given precautions - Plan: CBC (no diff), CMP with eGFR([facility]), Lipase, T4, Free, TSH, TSH, T4, Free, Lipase, CMP with eGFR([facility]), CBC (no diff)  F/u as needed with PCP.  Pt encouraged to move appointment up from August 20  Grier Mitts, MD

## 2019-08-03 LAB — COMPLETE METABOLIC PANEL WITH GFR
AG Ratio: 1.1 (calc) (ref 1.0–2.5)
ALT: 9 U/L (ref 6–29)
AST: 11 U/L (ref 10–35)
Albumin: 3.2 g/dL — ABNORMAL LOW (ref 3.6–5.1)
Alkaline phosphatase (APISO): 83 U/L (ref 37–153)
BUN/Creatinine Ratio: 36 (calc) — ABNORMAL HIGH (ref 6–22)
BUN: 18 mg/dL (ref 7–25)
CO2: 24 mmol/L (ref 20–32)
Calcium: 9.4 mg/dL (ref 8.6–10.4)
Chloride: 106 mmol/L (ref 98–110)
Creat: 0.5 mg/dL — ABNORMAL LOW (ref 0.60–0.93)
GFR, Est African American: 112 mL/min/{1.73_m2} (ref >=60)
GFR, Est Non African American: 97 mL/min/{1.73_m2} (ref >=60)
Globulin: 3 g/dL (calc) (ref 1.9–3.7)
Glucose, Bld: 205 mg/dL — ABNORMAL HIGH (ref 65–99)
Potassium: 4.2 mmol/L (ref 3.5–5.3)
Sodium: 138 mmol/L (ref 135–146)
Total Bilirubin: 0.3 mg/dL (ref 0.2–1.2)
Total Protein: 6.2 g/dL (ref 6.1–8.1)

## 2019-08-03 LAB — HEMOGLOBIN A1C
Hgb A1c MFr Bld: 12.5 % of total Hgb — ABNORMAL HIGH (ref <5.7)
Mean Plasma Glucose: 312 (calc)
eAG (mmol/L): 17.3 (calc)

## 2019-08-03 LAB — CBC
HCT: 37.6 % (ref 35.0–45.0)
Hemoglobin: 12.2 g/dL (ref 11.7–15.5)
MCH: 30.6 pg (ref 27.0–33.0)
MCHC: 32.4 g/dL (ref 32.0–36.0)
MCV: 94.2 fL (ref 80.0–100.0)
MPV: 10.8 fL (ref 7.5–12.5)
Platelets: 256 10*3/uL (ref 140–400)
RBC: 3.99 10*6/uL (ref 3.80–5.10)
RDW: 12.8 % (ref 11.0–15.0)
WBC: 7 10*3/uL (ref 3.8–10.8)

## 2019-08-03 LAB — T4, FREE: Free T4: 1 ng/dL (ref 0.8–1.8)

## 2019-08-03 LAB — TSH: TSH: 1.67 mIU/L (ref 0.40–4.50)

## 2019-08-03 LAB — LIPASE: Lipase: 7 U/L (ref 7–60)

## 2019-08-03 LAB — FRUCTOSAMINE: Fructosamine: 331 umol/L — ABNORMAL HIGH (ref 205–285)

## 2019-08-06 ENCOUNTER — Other Ambulatory Visit: Payer: Self-pay | Admitting: Family Medicine

## 2019-08-06 ENCOUNTER — Telehealth: Payer: Self-pay | Admitting: *Deleted

## 2019-08-06 ENCOUNTER — Encounter: Payer: Self-pay | Admitting: Family Medicine

## 2019-08-06 DIAGNOSIS — E1121 Type 2 diabetes mellitus with diabetic nephropathy: Secondary | ICD-10-CM

## 2019-08-06 DIAGNOSIS — IMO0002 Reserved for concepts with insufficient information to code with codable children: Secondary | ICD-10-CM

## 2019-08-06 DIAGNOSIS — E1165 Type 2 diabetes mellitus with hyperglycemia: Secondary | ICD-10-CM

## 2019-08-06 MED ORDER — LANTUS SOLOSTAR 100 UNIT/ML ~~LOC~~ SOPN: 35.0000 [IU] | PEN_INJECTOR | Freq: Two times a day (BID) | SUBCUTANEOUS | 3 refills | Status: DC

## 2019-08-06 MED ORDER — HUMALOG KWIKPEN 200 UNIT/ML ~~LOC~~ SOPN: PEN_INJECTOR | SUBCUTANEOUS | 2 refills | Status: DC

## 2019-08-06 NOTE — Telephone Encounter (Signed)
Patient returned call to clinic. Patient scheduled earlier appointment with PCP for a follow-up per Dr. Volanda Napoleon recommendations. Patient scheduled for 08/15/2019.

## 2019-08-06 NOTE — Progress Notes (Signed)
Lantus to be increased from 30 U bid to 35 U bid. Humalog 15 U 10-15 min before meals. Continue monitoring BS's and caution with skipping meals. Endocrinology referral placed. Jamille Fisher [name], MD

## 2019-08-13 ENCOUNTER — Ambulatory Visit: Payer: Medicare Other | Admitting: Family Medicine

## 2019-08-15 ENCOUNTER — Ambulatory Visit: Payer: Medicare Other | Admitting: Family Medicine

## 2019-08-15 ENCOUNTER — Encounter: Payer: Self-pay | Admitting: Internal Medicine

## 2019-08-15 ENCOUNTER — Other Ambulatory Visit: Payer: Self-pay

## 2019-08-15 ENCOUNTER — Ambulatory Visit: Payer: Medicare Other | Admitting: Internal Medicine

## 2019-08-15 VITALS — BP 148/78 | HR 79 | Ht 60.0 in | Wt 138.8 lb

## 2019-08-15 DIAGNOSIS — Z794 Long term (current) use of insulin: Secondary | ICD-10-CM

## 2019-08-15 DIAGNOSIS — E785 Hyperlipidemia, unspecified: Secondary | ICD-10-CM | POA: Diagnosis not present

## 2019-08-15 DIAGNOSIS — E1165 Type 2 diabetes mellitus with hyperglycemia: Secondary | ICD-10-CM

## 2019-08-15 DIAGNOSIS — R809 Proteinuria, unspecified: Secondary | ICD-10-CM

## 2019-08-15 DIAGNOSIS — E1129 Type 2 diabetes mellitus with other diabetic kidney complication: Secondary | ICD-10-CM

## 2019-08-15 DIAGNOSIS — IMO0002 Reserved for concepts with insufficient information to code with codable children: Secondary | ICD-10-CM

## 2019-08-15 DIAGNOSIS — E1142 Type 2 diabetes mellitus with diabetic polyneuropathy: Secondary | ICD-10-CM | POA: Diagnosis not present

## 2019-08-15 DIAGNOSIS — E11649 Type 2 diabetes mellitus with hypoglycemia without coma: Secondary | ICD-10-CM | POA: Insufficient documentation

## 2019-08-15 DIAGNOSIS — E1121 Type 2 diabetes mellitus with diabetic nephropathy: Secondary | ICD-10-CM

## 2019-08-15 LAB — POCT GLUCOSE (DEVICE FOR HOME USE): Glucose Fasting, POC: 384 mg/dL — AB (ref 70–99)

## 2019-08-15 MED ORDER — HUMALOG KWIKPEN 200 UNIT/ML ~~LOC~~ SOPN: 15.0000 [IU] | PEN_INJECTOR | Freq: Three times a day (TID) | SUBCUTANEOUS | 6 refills | Status: DC

## 2019-08-15 MED ORDER — VICTOZA 18 MG/3ML ~~LOC~~ SOPN: 1.8000 mg | PEN_INJECTOR | Freq: Every day | SUBCUTANEOUS | 3 refills | Status: DC

## 2019-08-15 MED ORDER — LANTUS SOLOSTAR 100 UNIT/ML ~~LOC~~ SOPN: 60.0000 [IU] | PEN_INJECTOR | Freq: Every day | SUBCUTANEOUS | 3 refills | Status: DC

## 2019-08-15 NOTE — Patient Instructions (Addendum)
-   STOP Metformin  - Continue Victoza 1.8 mg daily  - Decrease Lantus to 60 units daily  - Humalog 15 units with each meal     Choose healthy, lower carb lower calorie snacks: toss salad, cooked vegetables, cottage cheese, peanut butter, low fat cheese / string cheese, lower sodium deli meat, tuna salad or chicken salad     HOW TO TREAT LOW BLOOD SUGARS (Blood sugar LESS THAN 70 MG/DL)  Please follow the RULE OF 15 for the treatment of hypoglycemia treatment (when your (blood sugars are less than 70 mg/dL)    STEP 1: Take 15 grams of carbohydrates when your blood sugar is low, which includes:   3-4 GLUCOSE TABS  OR  3-4 OZ OF JUICE OR REGULAR SODA OR  ONE TUBE OF GLUCOSE GEL     STEP 2: RECHECK blood sugar in 15 MINUTES STEP 3: If your blood sugar is still low at the 15 minute recheck --> then, go back to STEP 1 and treat AGAIN with another 15 grams of carbohydrates.

## 2019-08-15 NOTE — Progress Notes (Signed)
Name: Sarah [name]  MRN/ DOB: [id number], February 22, 1945   Age/ Sex: 74 y.o., female    PCP: [name], [name], MD   Reason for Endocrinology Evaluation: Type 2 Diabetes Mellitus     Date of Initial Endocrinology Visit: 08/15/2019     PATIENT IDENTIFIER: Sarah [name] is a 74 y.o. female with a past medical history of T2DM, HTN and dyslipidemia The patient presented for initial endocrinology clinic visit on 08/15/2019 for consultative assistance with her diabetes management.    HPI: Ms. Gadway was    Diagnosed with DM many years ago Prior Medications tried/Intolerance: does not recall  Currently checking blood sugars 2 x / day. Hypoglycemia episodes : no            Hemoglobin A1c has ranged from 10.3% in 2020, peaking at 16.3% in 2016. Patient required assistance for hypoglycemia: no Patient has required hospitalization within the last 1 year from hyper or hypoglycemia: no   In terms of diet, the patient eats 2 meals a day ( lunch and dinner). Snacks on fruits, drinks sugar- sweetened beverages.    Lives with mother who is 44 yrs ago     [place]: Lantus 35 units BID - used to be 60 QD, but switched to 35 BID last month  Humalog 18 units daily in the morning  Victoza 1.8 daily  Metformin 500 mg BID - makes her stomach hurt so doesn't take it regularly    Statin: yes ACE-I/ARB: yes Prior Diabetic Education: yes   METER DOWNLOAD SUMMARY: Did not bring    DIABETIC COMPLICATIONS: Microvascular complications:   ? Retinopathy,   Denies: CKD   Last eye exam: Completed 02/2019  Macrovascular complications:    Denies: CAD, PVD, CVA   PAST HISTORY: Past Medical History:  Past Medical History:  Diagnosis Date  . Bilateral carpal tunnel syndrome 03/27/2019  . Boils   . Glaucoma   . Heart murmur   . HTN (hypertension)   . Hx of adenomatous colonic polyps   . Hyperlipidemia   . Osteoporosis   . Pneumonia   . Stroke ([place])   . Type II  or unspecified type diabetes mellitus without mention of complication, uncontrolled    Past Surgical History:  Past Surgical History:  Procedure Laterality Date  . ABDOMINAL HYSTERECTOMY    . CARPAL TUNNEL RELEASE     right  . COLONOSCOPY  12-10-10   per Dr. Deatra Ina, clear, repeat in 7 yrs   . INCISION AND DRAINAGE PERIRECTAL ABSCESS N/A 09/26/2015   Procedure: IRRIGATION AND DEBRIDEMENT PERIRECTAL ABSCESS;  Surgeon: Mickeal Skinner, MD;  Location: [facility];  Service: General;  Laterality: N/A;  . KNEE ARTHROSCOPY     right  . LOOP RECORDER INSERTION N/A 10/19/2016   Procedure: LOOP RECORDER INSERTION;  Surgeon: Thompson Grayer, MD;  Location: [facility];  Service: Cardiovascular;  Laterality: N/A;  . ORIF ANKLE FRACTURE Right 03/24/2012   Procedure: OPEN REDUCTION INTERNAL FIXATION (ORIF) ANKLE FRACTURE;  Surgeon: Newt Minion, MD;  Location: [facility];  Service: Orthopedics;  Laterality: Right;  Open Reduction Internal Fixation Right Bimalleolar ankle fracture  . POLYPECTOMY    . TEE WITHOUT CARDIOVERSION N/A 10/18/2016   Procedure: TRANSESOPHAGEAL ECHOCARDIOGRAM (TEE);  Surgeon: Fay Records, MD;  Location: [facility];  Service: Cardiovascular;  Laterality: N/A;  . TONSILLECTOMY        Social History:  reports that she quit smoking about 2 years ago. Her smoking use  included cigarettes. She has never used smokeless tobacco. She reports current alcohol use. She reports that she does not use drugs. Family History:  Family History  Problem Relation Age of Onset  . Diabetes Mother   . Hypertension Mother   . Stroke Father   . Stroke Maternal Uncle   . Stroke Paternal Uncle   . Colon cancer Neg Hx   . Esophageal cancer Neg Hx   . Rectal cancer Neg Hx   . Stomach cancer Neg Hx      HOME MEDICATIONS: Allergies as of 08/15/2019   No Known Allergies     Medication List       Accurate as of August 15, 2019  3:33 PM. If you have any questions, ask your nurse or doctor.         acetaminophen 500 MG tablet Commonly known as: TYLENOL Take 1,000 mg by mouth every 6 (six) hours as needed for mild pain or headache.   amLODipine 10 MG tablet Commonly known as: NORVASC Take 1 tablet (10 mg total) by mouth daily. What changed: additional instructions   atorvastatin 80 MG tablet Commonly known as: LIPITOR Take 1 tablet (80 mg total) by mouth daily at 6 PM.   BD Pen Needle Nano U/F 32G X 4 MM Misc Generic drug: Insulin Pen Needle USE UP TO FOUR TIMES DAILY   calcium carbonate 750 MG chewable tablet Commonly known as: TUMS EX Chew 1 tablet by mouth as needed for heartburn.   clopidogrel 75 MG tablet Commonly known as: PLAVIX TAKE 1 TABLET BY MOUTH DAILY   diclofenac Sodium 1 % Gel Commonly known as: Voltaren Apply 2 g topically 4 (four) times daily.   ezetimibe 10 MG tablet Commonly known as: Zetia Take 1 tablet (10 mg total) by mouth daily.   famotidine 40 MG tablet Commonly known as: PEPCID TAKE 1 TABLET(40 MG) BY MOUTH AT BEDTIME   HumaLOG KwikPen 200 UNIT/ML KwikPen Generic drug: insulin lispro 15 U 15 min before meals   Lantus SoloStar 100 UNIT/ML Solostar Pen Generic drug: insulin glargine Inject 35 Units into the skin 2 (two) times daily.   latanoprost 0.005 % ophthalmic solution Commonly known as: XALATAN Place 1 drop into both eyes at bedtime.   losartan 50 MG tablet Commonly known as: COZAAR TAKE 1 TABLET(50 MG) BY MOUTH DAILY   metFORMIN 500 MG tablet Commonly known as: GLUCOPHAGE Take 1 tablet (500 mg total) by mouth 2 (two) times daily with a meal.   omeprazole 20 MG capsule Commonly known as: PRILOSEC Take 1 capsule (20 mg total) by mouth daily.   ONE TOUCH ULTRA TEST test strip Generic drug: glucose blood Check BS before and 2 hours after meals.   OT ULTRA/FASTTK CNTRL SOLN Soln   Pharmacist Choice Alcohol 70 % Misc Generic drug: Isopropyl Alcohol   Rocklatan 0.02-0.005 % Soln Generic drug:  Netarsudil-Latanoprost Place 1 drop into both eyes every evening.   Simple Diagnostics Lancing Dev Misc Check BS before and 2 hours after meals.   Victoza 18 MG/3ML Sopn Generic drug: liraglutide Inject 0.3 mLs (1.8 mg total) into the skin daily.   Vitamin D (Ergocalciferol) 1.25 MG (50000 UNIT) Caps capsule Commonly known as: DRISDOL TAKE ONE CAPSULE BY MOUTH TWICE A WEEK( MONDAY AND THURSDAY)        ALLERGIES: No Known Allergies   REVIEW OF SYSTEMS: A comprehensive ROS was conducted with the patient and is negative except as per HPI and below:  Review of Systems  Gastrointestinal: Negative for nausea.  Musculoskeletal: Positive for joint pain.  Neurological: Negative for tingling and tremors.      OBJECTIVE:   VITAL SIGNS: BP (!) 148/78 (BP Location: Right Arm, Patient Position: Sitting, Cuff Size: Normal)   Pulse 79   Ht 5' (1.524 m)   Wt 138 lb 12.8 oz (63 kg)   SpO2 95%   BMI 27.11 kg/m    PHYSICAL EXAM:  General: Pt appears well and is in NAD  Hydration: Well-hydrated with moist mucous membranes and good skin turgor  HEENT: Head: Unremarkable with good dentition. Oropharynx clear without exudate.  Eyes: External eye exam normal without stare, lid lag or exophthalmos.  EOM intact.  PERRL.  Neck: General: Supple without adenopathy or carotid bruits. Thyroid: Thyroid size normal.  No goiter or nodules appreciated. No thyroid bruit.  Lungs: Clear with good BS bilat with no rales, rhonchi, or wheezes  Heart: RRR with normal S1 and S2 and no gallops; no murmurs; no rub  Abdomen: Normoactive bowel sounds, soft, nontender, without masses or organomegaly palpable  Extremities:  Lower extremities - No pretibial edema. No lesions.  Skin: Normal texture and temperature to palpation. No rash noted. No Acanthosis nigricans/skin tags. No lipohypertrophy.  Neuro: MS is good with appropriate affect, pt is alert and Ox3    DM foot exam: 8//04/2019  The skin of the feet  is intact without sores or ulcerations. The pedal pulses are 1+ on right and 1+ on left. The sensation is decreased  to a screening 5.07, 10 gram monofilament bilaterally   DATA REVIEWED:  Lab Results  Component Value Date   HGBA1C 12.5 (H) 08/01/2019   HGBA1C 12.3 (A) 04/27/2019   HGBA1C 9.7 (A) 11/27/2018   Lab Results  Component Value Date   MICROALBUR 96.5 (H) 04/27/2019   LDLCALC 115 (H) 04/27/2019   CREATININE 0.50 (L) 08/01/2019   Lab Results  Component Value Date   MICRALBCREAT 116.0 (H) 04/27/2019    Lab Results  Component Value Date   CHOL 203 (H) 04/27/2019   HDL 65.10 04/27/2019   LDLCALC 115 (H) 04/27/2019   TRIG 114.0 04/27/2019   CHOLHDL 3 04/27/2019      Results for WILMOTH, RASNIC (MRN [id number]) as of 08/15/2019 13:12  Ref. Range 04/27/2019 11:00  MICROALB/CREAT RATIO Latest Ref Range: 0.0 - 30.0 mg/g 116.0 (H)    ASSESSMENT / PLAN / RECOMMENDATIONS:   1) Type 2 Diabetes Mellitus, Poorly controlled, With microalbuminuria and neuropathic  complications - Most recent A1c of 12.5 %. Goal A1c < 7.0 %.   Plan: GENERAL: I have discussed with the patient the pathophysiology of diabetes. We went over the natural progression of the disease. We talked about both insulin resistance and insulin deficiency. We stressed the importance of lifestyle changes including diet and exercise. I explained the complications associated with diabetes including retinopathy, nephropathy, neuropathy as well as increased risk of cardiovascular disease. We went over the benefit seen with glycemic control.    I explained to the patient that diabetic patients are at higher than normal risk for amputations. Discussed pharmacokinetics of basal/bolus insulin and the importance of taking prandial insulin with meals.   We also discussed avoiding sugar-sweetened beverages and snacks, when possible.   Will stop Metformin as it gives her stomach pain hence non-compliance.    Unfortunately she has been taking prandial insulin once daily in the morning regardless of eating, education was done on this today   MEDICATIONS: - STOP Metformin  -  Continue Victoza 1.8 mg daily  - Decrease Lantus to 60 units daily  - Humalog 15 units with each meal     EDUCATION / INSTRUCTIONS:  BG monitoring instructions: Patient is instructed to check her blood sugars 3 times a day, before meals.  Call [facility] if: BG persistently < 70  . I reviewed the Rule of 15 for the treatment of hypoglycemia in detail with the patient. Literature supplied.   2) Diabetic complications:   Eye: Does not have known diabetic retinopathy.   Neuro/ Feet: Does  have known diabetic peripheral neuropathy.  Renal: Patient does not have known baseline CKD. She is on an ACEI/ARB at present.   3) Dyslipidemia: Patient is on a statin. LDL above goal . Pt admits to non-compliance      Signed electronically by: Mack Guise, MD  [facility]  [facility] [address] [place], [place] [address] Phone: [phone] FAX: [phone]   CC: [name], [name], [place] [address] Phone: [phone]  Fax: [phone]    Return to Endocrinology clinic as below: Future Appointments  Date Time Provider [place]  08/20/2019 10:45 AM [name], [name], MD [facility]  08/31/2019 10:00 AM [name], [name], MD [facility]  09/03/2019 10:10 AM [facility] DEVICE REMOTES [facility]  10/08/2019 10:10 AM [facility] DEVICE REMOTES [facility]  11/12/2019 10:10 AM [facility] DEVICE REMOTES [facility]  12/17/2019 10:10 AM [facility] DEVICE REMOTES [facility]  01/21/2020 10:10 AM [facility] DEVICE REMOTES [facility]

## 2019-08-20 ENCOUNTER — Other Ambulatory Visit: Payer: Self-pay

## 2019-08-20 ENCOUNTER — Ambulatory Visit (INDEPENDENT_AMBULATORY_CARE_PROVIDER_SITE_OTHER): Payer: Medicare Other | Admitting: Family Medicine

## 2019-08-20 ENCOUNTER — Encounter: Payer: Self-pay | Admitting: Family Medicine

## 2019-08-20 VITALS — BP 136/76 | HR 88 | Resp 16 | Ht 60.0 in | Wt 135.4 lb

## 2019-08-20 DIAGNOSIS — E1142 Type 2 diabetes mellitus with diabetic polyneuropathy: Secondary | ICD-10-CM | POA: Diagnosis not present

## 2019-08-20 DIAGNOSIS — I119 Hypertensive heart disease without heart failure: Secondary | ICD-10-CM | POA: Diagnosis not present

## 2019-08-20 DIAGNOSIS — S6990XA Unspecified injury of unspecified wrist, hand and finger(s), initial encounter: Secondary | ICD-10-CM | POA: Diagnosis not present

## 2019-08-20 DIAGNOSIS — M20012 Mallet finger of left finger(s): Secondary | ICD-10-CM

## 2019-08-20 DIAGNOSIS — Z794 Long term (current) use of insulin: Secondary | ICD-10-CM

## 2019-08-20 NOTE — Patient Instructions (Addendum)
A few things to remember from today's visit:  If you need refills please call your pharmacy. Do not use My Chart to request refills or for acute issues that need immediate attention.    Please be sure medication list is accurate. If a new problem present, please set up appointment sooner than planned today.  Continue working on blood sugar numbers. Keep appt with nutritionist. No changes in medication today. Appt with ortho will be arranged.  Mallet Finger  Mallet finger is an injury that occurs when an object hits the tip of your straightened finger or thumb. It is also known as baseball finger. The blow to your fingertip causes it to bend more than normal, which tears the cord that attaches to the tip of your finger (extensor tendon).  Your extensor tendon is what straightens the end of your finger. If this tendon is damaged, you will not be able to straighten your fingertip. Sometimes, a piece of bone may be pulled away with the tendon (avulsion injury), or the tendon may tear completely. In some cases, surgery may be required to repair the damage. What are the causes? Mallet finger is caused by a hard, direct hit to the tip of your finger or thumb. This injury often happens from getting hit in the finger with a hard ball, such as a baseball. What increases the risk? This injury is more likely to happen if you play a sport that uses a hard ball. What are the signs or symptoms? The main symptom of this injury is the inability to straighten the tip of your finger. You can manually straighten your fingertip with your other hand, but the finger cannot straighten on its own. Other symptoms may include:  Pain.  Swelling.  Bruising.  Blood under the fingernail. How is this diagnosed? Your health care provider may suspect mallet finger if you are not able to extend your fingertip, especially if you recently injured your hand. Your health care provider will do a physical exam. This may  include X-rays to see if a piece of bone has been pulled away or if the finger joint has separated (dislocated). How is this treated? Mallet finger may be treated with:  A splint on your fingertip to keep it straight (extended) while the tendon heals.  Surgery to repair the tendon. This is done in severe cases. This may involve: ? Using a pin or screw to keep your finger extended and your tendon attached. ? Using a piece of tendon from another part of your body (graft) to replace a torn tendon. Follow these instructions at home: If you have a splint:  Wear the splint as told by your health care provider. Remove it only as told by your health care provider.  Loosen the splint if your fingers tingle, become numb, or turn cold and blue.  Keep the splint clean.  If the splint is not waterproof: ? Do not let it get wet. ? Cover it with a watertight covering when you take a bath or a shower.  If you take your splint off to dry it or change it: ? Gently press your finger on a flat surface to keep it straight. Failing to do so may lead to a permanent injury, or force you to wear the splint for a longer period of time. ? Check the skin under the splint. Tell your health care provider if you notice a blister or red and raw skin. Managing pain, stiffness, and swelling   If directed,  put ice on the injured area: ? If you have a removable splint, remove it as told by your health care provider. ? Put ice in a plastic bag. ? Place a towel between your skin and the bag. ? Leave the ice on for 20 minutes, 2-3 times a day.  Move your fingers often to avoid stiffness and to lessen swelling.  Raise (elevate)the injured hand above the level of your heart while you are sitting or lying down. General instructions  Take over-the-counter and prescription medicines only as told by your health care provider.  Do not drive or use heavy machinery while taking prescription pain medicine.  Keep all  follow-up visits as told by your health care provider. This is important. Contact a health care provider if:  You have pain or swelling that is getting worse.  Your finger feels cold.  You cannot extend your finger after treatment.  You notice that the skin under the splint is red, raw, or has a blister. Get help right away if:  Even after loosening your splint, your finger is: ? Very red and swollen. ? White or blue. ? Numb or tingling. Summary  Mallet finger is an injury that occurs from a hard, direct hit to the tip of your finger or thumb.  The blow to your fingertip causes it to bend more than normal, tearing the tendon that straightens the end of your finger. You cannot straighten your fingertip if this tendon is torn.  This injury often happens from getting hit in the finger with a hard ball, such as a baseball.  Treatment will depend on how severe the injury is. You may need to wear a splint to keep the finger straight while it heals. A more severe injury may require surgery to repair the tendon. This information is not intended to replace advice given to you by your health care provider. Make sure you discuss any questions you have with your health care provider. Document Revised: 01/10/2017 Document Reviewed: 01/10/2017 Elsevier Patient Education  2020 [employer].

## 2019-08-20 NOTE — Progress Notes (Signed)
HPI: Sarah [name] is a 74 y.o. female, who is here today for chronic disease management. She was last seen on 04/27/19. She established with endocrinologist recently. She is on Lantus 60 U daily, Humalog 15 U tid,,and Victoza 1.8 mg daily. She has not had hypoglycemic events.  She has appt with diabetic educator in a few weeks. BS's "Ok."  HTN: She is on Amlodipine 10 mg daily and Losartan 50 mg daily. + CVA, she is on Plavix 75 mg daily and Atorvastatin 80 mg daily. Negative for severe/frequent headache, visual changes, chest pain, dyspnea, palpitation, claudication, focal weakness, or edema.  Lab Results  Component Value Date   CREATININE 0.50 (L) 08/01/2019   BUN 18 08/01/2019   NA 138 08/01/2019   K 4.2 08/01/2019   CL 106 08/01/2019   CO2 24 08/01/2019   A months ago crush finger injury with car door. Pain and distal phalange edema. She is not able to extend DIP joint actively.  She did not seek medical attention. Soaked finger in vinegar with water.  Review of Systems  Constitutional: Positive for fatigue. Negative for activity change, appetite change and fever.  HENT: Negative for mouth sores, nosebleeds and sore throat.   Respiratory: Negative for cough and wheezing.   Gastrointestinal: Negative for abdominal pain, nausea and vomiting.       Negative for changes in bowel habits.  Genitourinary: Negative for decreased urine volume, dysuria and hematuria.  Musculoskeletal: Negative for gait problem.  Skin: Negative for rash and wound.  Neurological: Negative for syncope and facial asymmetry.  Rest of ROS, see pertinent positives sand negatives in HPI  Current Outpatient Medications on File Prior to Visit  Medication Sig Dispense Refill  . acetaminophen (TYLENOL) 500 MG tablet Take 1,000 mg by mouth every 6 (six) hours as needed for mild pain or headache.    Marland Kitchen amLODipine (NORVASC) 10 MG tablet Take 1 tablet (10 mg total) by mouth daily. (Patient  taking differently: Take 10 mg by mouth daily. Pt reports taking 2 10 mg tablets) 90 tablet 3  . atorvastatin (LIPITOR) 80 MG tablet Take 1 tablet (80 mg total) by mouth daily at 6 PM. 90 tablet 2  . Blood Glucose Calibration (OT ULTRA/FASTTK CNTRL SOLN) SOLN     . calcium carbonate (TUMS EX) 750 MG chewable tablet Chew 1 tablet by mouth as needed for heartburn.    . clopidogrel (PLAVIX) 75 MG tablet TAKE 1 TABLET BY MOUTH DAILY 90 tablet 1  . diclofenac Sodium (VOLTAREN) 1 % GEL Apply 2 g topically 4 (four) times daily. 150 g 3  . ezetimibe (ZETIA) 10 MG tablet Take 1 tablet (10 mg total) by mouth daily. 90 tablet 3  . famotidine (PEPCID) 40 MG tablet TAKE 1 TABLET(40 MG) BY MOUTH AT BEDTIME 90 tablet 3  . insulin glargine (LANTUS SOLOSTAR) 100 UNIT/ML Solostar Pen Inject 60 Units into the skin daily. 30 mL 3  . insulin lispro (HUMALOG KWIKPEN) 200 UNIT/ML KwikPen Inject 15 Units into the skin with breakfast, with lunch, and with evening meal. 15 U 15 min before meals 15 mL 6  . Insulin Pen Needle (BD PEN NEEDLE NANO U/F) 32G X 4 MM MISC USE UP TO FOUR TIMES DAILY 200 each 2  . Lancet Devices (SIMPLE DIAGNOSTICS LANCING DEV) MISC Check BS before and 2 hours after meals. 200 each 6  . latanoprost (XALATAN) 0.005 % ophthalmic solution Place 1 drop into both eyes at bedtime.     Marland Kitchen  liraglutide (VICTOZA) 18 MG/3ML SOPN Inject 0.3 mLs (1.8 mg total) into the skin daily. 9 mL 3  . losartan (COZAAR) 50 MG tablet TAKE 1 TABLET(50 MG) BY MOUTH DAILY 90 tablet 1  . omeprazole (PRILOSEC) 20 MG capsule Take 1 capsule (20 mg total) by mouth daily. 30 capsule 3  . ONE TOUCH ULTRA TEST test strip Check BS before and 2 hours after meals. 200 each 6  . PHARMACIST CHOICE ALCOHOL 70 % PADS     . ROCKLATAN 0.02-0.005 % SOLN Place 1 drop into both eyes every evening.    . Vitamin D, Ergocalciferol, (DRISDOL) 1.25 MG (50000 UNIT) CAPS capsule TAKE ONE CAPSULE BY MOUTH TWICE A WEEK( MONDAY AND THURSDAY) 8 capsule 0    No current facility-administered medications on file prior to visit.   Past Medical History:  Diagnosis Date  . Bilateral carpal tunnel syndrome 03/27/2019  . Boils   . Glaucoma   . Heart murmur   . HTN (hypertension)   . Hx of adenomatous colonic polyps   . Hyperlipidemia   . Osteoporosis   . Pneumonia   . Stroke ([place])   . Type II or unspecified type diabetes mellitus without mention of complication, uncontrolled    No Known Allergies  Social History   Socioeconomic History  . Marital status: Single    Spouse name: Not on file  . Number of children: Not on file  . Years of education: Not on file  . Highest education level: Not on file  Occupational History  . Not on file  Tobacco Use  . Smoking status: Former Smoker    Types: Cigarettes    Quit date: 10/15/2016    Years since quitting: 2.8  . Smokeless tobacco: Never Used  . Tobacco comment: smokes occ.   Vaping Use  . Vaping Use: Never used  Substance and Sexual Activity  . Alcohol use: Yes    Alcohol/week: 0.0 standard drinks    Comment: occ  . Drug use: No  . Sexual activity: Not on file  Other Topics Concern  . Not on file  Social History Narrative  . Not on file   Social Determinants of Health   Financial Resource Strain:   . Difficulty of Paying Living Expenses:   Food Insecurity: No Food Insecurity  . Worried About Charity fundraiser in the Last Year: Never true  . Ran Out of Food in the Last Year: Never true  Transportation Needs:   . Lack of Transportation (Medical):   Marland Kitchen Lack of Transportation (Non-Medical):   Physical Activity:   . Days of Exercise per Week:   . Minutes of Exercise per Session:   Stress: Stress Concern Present  . Feeling of Stress : To some extent  Social Connections:   . Frequency of Communication with Friends and Family:   . Frequency of Social Gatherings with Friends and Family:   . Attends Religious Services:   . Active Member of Clubs or Organizations:   . Attends  Archivist Meetings:   Marland Kitchen Marital Status:     Vitals:   08/20/19 1032  BP: 136/76  Pulse: 88  Resp: 16  SpO2: 99%   Body mass index is 26.44 kg/m.  Physical Exam Vitals and nursing note reviewed.  Constitutional:      General: She is not in acute distress.    Appearance: She is well-developed.  HENT:     Head: Normocephalic and atraumatic.     Mouth/Throat:  Mouth: Mucous membranes are dry.     Pharynx: Oropharynx is clear.  Eyes:     Conjunctiva/sclera: Conjunctivae normal.     Pupils: Pupils are equal, round, and reactive to light.  Cardiovascular:     Rate and Rhythm: Normal rate and regular rhythm.     Pulses:          Posterior tibial pulses are 2+ on the right side and 2+ on the left side.     Heart sounds: No murmur heard.   Pulmonary:     Effort: Pulmonary effort is normal. No respiratory distress.     Breath sounds: Normal breath sounds.  Abdominal:     Palpations: Abdomen is soft. There is no hepatomegaly or mass.     Tenderness: There is no abdominal tenderness.  Musculoskeletal:     Left hand: Deformity and tenderness present. Decreased range of motion.     Comments: Unable to extend left index DIP joint. Tenderness with palpation,not mal passive extension.  Lymphadenopathy:     Cervical: No cervical adenopathy.  Skin:    General: Skin is warm.     Findings: No erythema or rash.  Neurological:     Mental Status: She is alert and oriented to person, place, and time.     Cranial Nerves: No cranial nerve deficit.     Comments: Otherwise stable gait with no assistance.  Psychiatric:     Comments: Good eye contact.   ASSESSMENT AND PLAN:  Sarah [name] was seen today for chronic disease management.  Orders Placed This Encounter  Procedures  . DG Finger Index Left  . Ambulatory referral to Orthopedic Surgery    Finger injury, initial encounter Finger X ray to evaluate for avulsion fracture.  Mallet finger of left  finger(s) Educated about prognosis and treatment options. I am not sure benefits of immobilization or surgical treatment given the fact this occurred 4 weeks ago. Ortho consultation will be arranged.  Hypertension with heart disease BP adequately controlled. No changes in current management. Continue low salt diet.  Type 2 diabetes mellitus with diabetic polyneuropathy, with long-term current use of insulin (HCC) She has noted improvement in BS's. Possible complications of poorly controlled glucose controlled discussed. Keep nutritionist appt. Following with endocrinologist.  Return in about 6 months (around 02/20/2020).   [name]. [name], MD  [facility]. [place] office.

## 2019-08-23 ENCOUNTER — Encounter: Payer: Self-pay | Admitting: Family Medicine

## 2019-08-29 ENCOUNTER — Ambulatory Visit: Payer: Medicare Other | Admitting: Physician Assistant

## 2019-08-31 ENCOUNTER — Ambulatory Visit: Payer: Self-pay

## 2019-08-31 ENCOUNTER — Ambulatory Visit: Payer: Medicare Other | Admitting: Family Medicine

## 2019-08-31 ENCOUNTER — Ambulatory Visit (INDEPENDENT_AMBULATORY_CARE_PROVIDER_SITE_OTHER): Payer: Medicare Other | Admitting: Orthopaedic Surgery

## 2019-08-31 DIAGNOSIS — M79645 Pain in left finger(s): Secondary | ICD-10-CM | POA: Diagnosis not present

## 2019-08-31 NOTE — Progress Notes (Signed)
Office Visit Note   Patient: Sarah [name]           Date of Birth: 1945-03-31           MRN: [id number] Visit Date: 08/31/2019              Requested by: [name], [name], MD [id number] [place] [facility] [place],  [place] [address] PCP: [name], [name], MD   Assessment & Plan: Visit Diagnoses:  1. Finger pain, left     Plan: Impression is left index subacute bony mallet finger.  We will place the patient in an AlumaFoam splint immobilizing the DIP joint but leaving the PIP joint free to move.  Because she is already 4 weeks out from injury, we will do this for 3 weeks.  She will follow up with [place] at that point for recheck of xrays of the finger in the splint.  Call with concerns or questions in the meantime.  Follow-Up Instructions: Return in about 3 weeks (around 09/21/2019).   Orders:  Orders Placed This Encounter  Procedures  . XR Finger Index Left   No orders of the defined types were placed in this encounter.     Procedures: No procedures performed   Clinical Data: No additional findings.   Subjective: Chief Complaint  Patient presents with  . Left Hand - Pain    HPI patient is a pleasant 74 year old right-hand-dominant female who comes in today 4 weeks out from an injury to her left hand after slamming it in a car door.  She has had very slight pain but more of a deformity to the tip of the left index finger.  Her pain has improved over the past few weeks.  She is unable to actively extend the tip of her finger.  She has been taking Tylenol as needed.  No numbness, tingling or burning.  Review of Systems as detailed in HPI.  All others reviewed and are negative.   Objective: Vital Signs: There were no vitals taken for this visit.  Physical Exam well-developed well-nourished female no acute distress.  Alert and oriented x3.  Ortho Exam examination of her left hand index finger shows moderate tenderness to the DIP joint.  She is unable to actively extend the  tip of the finger.  I can passively extend it to full extension.  She is neurovascularly intact distally.  Specialty Comments:  No specialty comments available.  Imaging: XR Finger Index Left  Result Date: 09/01/2019 X-rays demonstrate a subacute bony mallet finger    PMFS History: Patient Active Problem List   Diagnosis Date Noted  . Type 2 diabetes mellitus with hyperglycemia, with long-term current use of insulin ([place]) 08/15/2019  . Type 2 diabetes mellitus with diabetic polyneuropathy, with long-term current use of insulin ([place]) 08/15/2019  . Type 2 diabetes mellitus with microalbuminuria, with long-term current use of insulin ([place]) 08/15/2019  . Dyslipidemia 08/15/2019  . Bilateral carpal tunnel syndrome 03/27/2019  . Osteoarthritis of both knees 11/27/2018  . CKD (chronic kidney disease), stage II 11/27/2018  . GERD (gastroesophageal reflux disease) 06/26/2018  . Homonymous hemianopia, left 02/08/2018  . HTN (hypertension)   . Trigger finger, right index finger 10/17/2017  . Low back pain 07/29/2017  . Carotid artery stenosis 11/02/2016  . Left arm weakness   . Diastolic dysfunction   . Hypertension with heart disease   . Tobacco abuse   . Acute blood loss anemia   . Cerebral infarction due to embolism of right  middle cerebral artery ([place])   . Perirectal abscess 09/26/2015  . Vitamin D deficiency 07/23/2015  . Osteopenia 02/16/2013  . Ankle fracture 01/19/2013  . Anemia 09/27/2011  . Type II diabetes mellitus with nephropathy ([place]) 09/24/2011  . Hyperlipidemia 09/24/2011   Past Medical History:  Diagnosis Date  . Bilateral carpal tunnel syndrome 03/27/2019  . Boils   . Glaucoma   . Heart murmur   . HTN (hypertension)   . Hx of adenomatous colonic polyps   . Hyperlipidemia   . Osteoporosis   . Pneumonia   . Stroke ([place])   . Type II or unspecified type diabetes mellitus without mention of complication, uncontrolled     Family History  Problem Relation Age  of Onset  . Diabetes Mother   . Hypertension Mother   . Stroke Father   . Stroke Maternal Uncle   . Stroke Paternal Uncle   . Colon cancer Neg Hx   . Esophageal cancer Neg Hx   . Rectal cancer Neg Hx   . Stomach cancer Neg Hx     Past Surgical History:  Procedure Laterality Date  . ABDOMINAL HYSTERECTOMY    . CARPAL TUNNEL RELEASE     right  . COLONOSCOPY  12-10-10   per Dr. Deatra Ina, clear, repeat in 7 yrs   . INCISION AND DRAINAGE PERIRECTAL ABSCESS N/A 09/26/2015   Procedure: IRRIGATION AND DEBRIDEMENT PERIRECTAL ABSCESS;  Surgeon: Mickeal Skinner, MD;  Location: [facility];  Service: General;  Laterality: N/A;  . KNEE ARTHROSCOPY     right  . LOOP RECORDER INSERTION N/A 10/19/2016   Procedure: LOOP RECORDER INSERTION;  Surgeon: Thompson Grayer, MD;  Location: [facility];  Service: Cardiovascular;  Laterality: N/A;  . ORIF ANKLE FRACTURE Right 03/24/2012   Procedure: OPEN REDUCTION INTERNAL FIXATION (ORIF) ANKLE FRACTURE;  Surgeon: Newt Minion, MD;  Location: [facility];  Service: Orthopedics;  Laterality: Right;  Open Reduction Internal Fixation Right Bimalleolar ankle fracture  . POLYPECTOMY    . TEE WITHOUT CARDIOVERSION N/A 10/18/2016   Procedure: TRANSESOPHAGEAL ECHOCARDIOGRAM (TEE);  Surgeon: Fay Records, MD;  Location: [facility];  Service: Cardiovascular;  Laterality: N/A;  . TONSILLECTOMY     Social History   Occupational History  . Not on file  Tobacco Use  . Smoking status: Former Smoker    Types: Cigarettes    Quit date: 10/15/2016    Years since quitting: 2.8  . Smokeless tobacco: Never Used  . Tobacco comment: smokes occ.   Vaping Use  . Vaping Use: Never used  Substance and Sexual Activity  . Alcohol use: Yes    Alcohol/week: 0.0 standard drinks    Comment: occ  . Drug use: No  . Sexual activity: Not on file

## 2019-09-02 LAB — CUP PACEART REMOTE DEVICE CHECK
Date Time Interrogation Session: 20210818232721
Implantable Pulse Generator Implant Date: 20181009

## 2019-09-03 ENCOUNTER — Ambulatory Visit (INDEPENDENT_AMBULATORY_CARE_PROVIDER_SITE_OTHER): Payer: Medicare Other | Admitting: *Deleted

## 2019-09-03 DIAGNOSIS — I639 Cerebral infarction, unspecified: Secondary | ICD-10-CM | POA: Diagnosis not present

## 2019-09-07 NOTE — Progress Notes (Signed)
[facility] Summary Report / Loop Recorder 

## 2019-09-20 ENCOUNTER — Ambulatory Visit: Payer: HMO

## 2019-09-28 ENCOUNTER — Ambulatory Visit: Payer: Medicare Other | Admitting: Orthopaedic Surgery

## 2019-10-01 ENCOUNTER — Encounter: Payer: Self-pay | Admitting: Dietician

## 2019-10-01 ENCOUNTER — Other Ambulatory Visit: Payer: Self-pay

## 2019-10-01 ENCOUNTER — Encounter: Payer: Medicare Other | Attending: Internal Medicine | Admitting: Dietician

## 2019-10-01 DIAGNOSIS — Z794 Long term (current) use of insulin: Secondary | ICD-10-CM | POA: Insufficient documentation

## 2019-10-01 DIAGNOSIS — E1165 Type 2 diabetes mellitus with hyperglycemia: Secondary | ICD-10-CM | POA: Insufficient documentation

## 2019-10-01 NOTE — Patient Instructions (Addendum)
Consider trying Splenda Naturals Stevia or Stevia in the Raw  Be sure to rotate the insulin injection site. Consider asking your insurance company about the [place] and Dexcom to see which is covered best and consider asking Dr. Kelton Pillar for a prescription for the best covered continuous glucose monitor.  Meal Planning: Aim for 30-45 grams of carbs (2-3 carb choices) per meal Choose a protein with each meal 1/2 your plate should be non-starchy vegetables  Practice meal planning. Recommend boiling the chicken a day in advance of making soup and remove the fat.

## 2019-10-01 NOTE — Progress Notes (Signed)
Diabetes Self-Management Education  Visit Type: First/Initial  Appt. Start Time: 1410 Appt. End Time: 4287  10/01/2019  [name], identified by name and date of birth, is a 74 y.o. female with a diagnosis of Diabetes: Type 2.   ASSESSMENT Patient is here today alone. She states that she is having problems being consistent and treats herself with Pepsi and is working on this.  Has been drinking unsweetened tea with Splenda instead but dislikes the aftertaste.  She would like to have a meal plan.  History includes Type 2 Diabetes (for about 30 years), HTN, dyslipidemia, stroke A1C 12.5% and Fructosamine 331 08/01/19, 12.3% 04/27/2019 Medication includes Lantus 60 units q am, Humalog 15 units before meals (sometimes only has coffee in the am but takes Humalog at times without eating).  Patient lives with her 26 yo mom and cares for her.  Her sister helps when possible.  States that her mom is negative and this causes Sarah [name] to struggle with depression.  She does the shopping and cooking.  Mom has lost her taste buds and finding something mom enjoys is difficult.  She is retired and worked for [employer] for 30 years and moved back from [place] 16 years ago. Walks in the neighborhood and has a two story house. Height 5' (1.524 m), weight 135 lb (61.2 kg). Body mass index is 26.37 kg/m.   Diabetes Self-Management Education - 10/01/19 1434      Visit Information   Visit Type First/Initial      Initial Visit   Diabetes Type Type 2    Are you currently following a meal plan? No    Are you taking your medications as prescribed? Yes    Date Diagnosed [place]   How would you rate your overall health? Fair      Psychosocial Assessment   Patient Belief/Attitude about Diabetes Motivated to manage diabetes    Self-care barriers None    Self-management support Doctor's office    Other persons present Patient    Patient Concerns Nutrition/Meal planning;Glycemic  Control;Weight Control    Special Needs None    Preferred Learning Style No preference indicated    Learning Readiness Ready    How often do you need to have someone help you when you read instructions, pamphlets, or other written materials from your doctor or pharmacy? 1 - Never    What is the last grade level you completed in school? 12      Pre-Education Assessment   Patient understands the diabetes disease and treatment process. Needs Review    Patient understands incorporating nutritional management into lifestyle. Needs Review    Patient undertands incorporating physical activity into lifestyle. Needs Review    Patient understands using medications safely. Needs Review    Patient understands monitoring blood glucose, interpreting and using results Needs Review    Patient understands prevention, detection, and treatment of acute complications. Needs Review    Patient understands prevention, detection, and treatment of chronic complications. Needs Review    Patient understands how to develop strategies to address psychosocial issues. Needs Review    Patient understands how to develop strategies to promote health/change behavior. Needs Review      Complications   Last HgB A1C per patient/outside source 12.5 %   08/01/19 increased from 12.3% 04/27/2019   How often do you check your blood sugar? 1-2 times/day    Fasting Blood glucose range (mg/dL) 180-200;>200   190-211  Postprandial Blood glucose range (mg/dL) >200   230   Number of hypoglycemic episodes per month 0    Number of hyperglycemic episodes per week 14    Can you tell when your blood sugar is high? Yes   blurry vision and memory concerns   What do you do if your blood sugar is high? be sure to take medication as prescribed.  Avoid regular Pepsi.    Have you had a dilated eye exam in the past 12 months? Yes    Have you had a dental exam in the past 12 months? Yes    Are you checking your feet? Yes    How many days per week  are you checking your feet? 3      Dietary Intake   Breakfast cereal (honey nut cheerios or frosted flakes), occasional banana OR waffle with regular syrup, sausage AND coffee with coffeemate (1T) OR SKIPS   7   Snack (morning) none    Lunch sandwich or salad or leftovers    Snack (afternoon) none    Dinner shake and bake pork chop OR spaghetti OR hotdog or hamburger on a bun with french fries OR homemade chicken pie    Snack (evening) ice cream cone or fruit    Beverage(s) water, unsweetened tea with Splenda, regular Pepsi (1 can 3 times per week)      Exercise   Exercise Type Light (walking / raking leaves)    How many days per week to you exercise? 4    How many minutes per day do you exercise? -5    Total minutes per week of exercise -20      Patient Education   Previous Diabetes Education Yes (please comment)   2015 NDES with Bev   Disease state  Definition of diabetes, type 1 and 2, and the diagnosis of diabetes    Nutrition management  Food label reading, portion sizes and measuring food.;Role of diet in the treatment of diabetes and the relationship between the three main macronutrients and blood glucose level;Meal options for control of blood glucose level and chronic complications.    Physical activity and exercise  Role of exercise on diabetes management, blood pressure control and cardiac health.    Medications Taught/reviewed insulin injection, site rotation, insulin storage and needle disposal.;Reviewed patients medication for diabetes, action, purpose, timing of dose and side effects.    Monitoring Identified appropriate SMBG and/or A1C goals.;Daily foot exams    Acute complications Taught treatment of hypoglycemia - the 15 rule.;Discussed and identified patients' treatment of hyperglycemia.    Chronic complications Relationship between chronic complications and blood glucose control    Psychosocial adjustment Identified and addressed patients feelings and concerns about  diabetes;Worked with patient to identify barriers to care and solutions;Role of stress on diabetes      Individualized Goals (developed by patient)   Nutrition General guidelines for healthy choices and portions discussed    Physical Activity Exercise 5-7 days per week;30 minutes per day    Medications take my medication as prescribed    Monitoring  test my blood glucose as discussed    Reducing Risk increase portions of healthy fats    Health Coping discuss diabetes with (comment)      Post-Education Assessment   Patient understands the diabetes disease and treatment process. Demonstrates understanding / competency    Patient understands incorporating nutritional management into lifestyle. Needs Review    Patient undertands incorporating physical activity into lifestyle. Demonstrates understanding /  competency    Patient understands using medications safely. Demonstrates understanding / competency    Patient understands monitoring blood glucose, interpreting and using results Demonstrates understanding / competency    Patient understands prevention, detection, and treatment of acute complications. Demonstrates understanding / competency    Patient understands prevention, detection, and treatment of chronic complications. Demonstrates understanding / competency    Patient understands how to develop strategies to address psychosocial issues. Demonstrates understanding / competency    Patient understands how to develop strategies to promote health/change behavior. Needs Review      Outcomes   Expected Outcomes Demonstrated interest in learning. Expect positive outcomes    Future DMSE 4-6 wks    Program Status Not Completed           Individualized Plan for Diabetes Self-Management Training:   Learning Objective:  Patient will have a greater understanding of diabetes self-management. Patient education plan is to attend individual and/or group sessions per assessed needs and concerns.     Plan:   Patient Instructions  Consider trying Splenda Naturals Stevia or Stevia in the Raw  Be sure to rotate the insulin injection site. Consider asking your insurance company about the [place] and Dexcom to see which is covered best and consider asking Dr. Kelton Pillar for a prescription for the best covered continuous glucose monitor.  Meal Planning: Aim for 30-45 grams of carbs (2-3 carb choices) per meal Choose a protein with each meal 1/2 your plate should be non-starchy vegetables  Practice meal planning. Recommend boiling the chicken a day in advance of making soup and remove the fat.   Expected Outcomes:  Demonstrated interest in learning. Expect positive outcomes  Education material provided: ADA - How to Thrive: A Guide for Your Journey with Diabetes and Meal plan card; types of fat; Meal planning by [employer]  If problems or questions, patient to contact team via:  Phone  Future DSME appointment: 4-6 wks

## 2019-10-05 ENCOUNTER — Ambulatory Visit: Payer: Medicare Other | Admitting: Orthopaedic Surgery

## 2019-10-05 ENCOUNTER — Ambulatory Visit: Payer: Self-pay

## 2019-10-05 ENCOUNTER — Encounter: Payer: Self-pay | Admitting: Orthopaedic Surgery

## 2019-10-05 VITALS — Ht 60.0 in | Wt 135.0 lb

## 2019-10-05 DIAGNOSIS — M20012 Mallet finger of left finger(s): Secondary | ICD-10-CM

## 2019-10-05 NOTE — Progress Notes (Signed)
Office Visit Note   Patient: Sarah [name]           Date of Birth: [birth year]           MRN: [id number] Visit Date: 10/05/2019              Requested by: [name], [name], MD [address] [place],  [place] [address] PCP: [name], [name], MD   Assessment & Plan: Visit Diagnoses:  1. Mallet finger, left     Plan: Impression is left index bony mallet finger.  Believe the patient has received maximum benefit from splinting and there is not a good surgical option for correction.  She will continue to advance with activity as tolerated.  Follow-up with [place] as needed.  Follow-Up Instructions: Return if symptoms worsen or fail to improve.   Orders:  Orders Placed This Encounter  Procedures   XR Finger Index Left   No orders of the defined types were placed in this encounter.     Procedures: No procedures performed   Clinical Data: No additional findings.   Subjective: Chief Complaint  Patient presents with   Left Hand - Follow-up    Left index mallet finger    HPI patient is a pleasant 74 year old who comes in today 9 weeks out left index bony mallet finger.Marland Kitchen  She was seen by [place] 4 weeks after her initial injury.  We placed her in a splint to the DIP joint for which she removed about a week ago.  She only has pain with passive extension of the joint.  Review of Systems as detailed in HPI.  All others reviewed and are negative.   Objective: Vital Signs: Ht 5' (1.524 m)    Wt 135 lb (61.2 kg)    BMI 26.37 kg/m   Physical Exam well-developed well-nourished female no acute distress.  Alert and oriented x3.  Ortho Exam examination of the left index finger reveals limitation with active extension of the DIP joint.  I can passively get her to full extension but this is with some pain.  Fingers are warm and well-perfused.  Specialty Comments:  No specialty comments available.  Imaging: XR Finger Index Left  Result Date: 10/05/2019 X-rays demonstrate  stable avulsion to the DIP joint    PMFS History: Patient Active Problem List   Diagnosis Date Noted   Type 2 diabetes mellitus with hyperglycemia, with long-term current use of insulin ([place]) 08/15/2019   Type 2 diabetes mellitus with diabetic polyneuropathy, with long-term current use of insulin ([place]) 08/15/2019   Type 2 diabetes mellitus with microalbuminuria, with long-term current use of insulin ([place]) 08/15/2019   Dyslipidemia 08/15/2019   Bilateral carpal tunnel syndrome 03/27/2019   Osteoarthritis of both knees 11/27/2018   CKD (chronic kidney disease), stage II 11/27/2018   GERD (gastroesophageal reflux disease) 06/26/2018   Homonymous hemianopia, left 02/08/2018   HTN (hypertension)    Trigger finger, right index finger 10/17/2017   Low back pain 07/29/2017   Carotid artery stenosis 11/02/2016   Left arm weakness    Diastolic dysfunction    Hypertension with heart disease    Tobacco abuse    Acute blood loss anemia    Cerebral infarction due to embolism of right middle cerebral artery ([place])    Perirectal abscess 09/26/2015   Vitamin D deficiency 07/23/2015   Osteopenia 02/16/2013   Ankle fracture 01/19/2013   Anemia 09/27/2011   Type II diabetes mellitus with nephropathy ([place]) 09/24/2011   Hyperlipidemia 09/24/2011  Past Medical History:  Diagnosis Date   Bilateral carpal tunnel syndrome 03/27/2019   Boils    Glaucoma    Heart murmur    HTN (hypertension)    Hx of adenomatous colonic polyps    Hyperlipidemia    Osteoporosis    Pneumonia    Stroke ([place])    Type II or unspecified type diabetes mellitus without mention of complication, uncontrolled     Family History  Problem Relation Age of Onset   Diabetes Mother    Hypertension Mother    Stroke Father    Stroke Maternal Uncle    Stroke Paternal Uncle    Colon cancer Neg Hx    Esophageal cancer Neg Hx    Rectal cancer Neg Hx    Stomach cancer Neg Hx       Past Surgical History:  Procedure Laterality Date   ABDOMINAL HYSTERECTOMY     CARPAL TUNNEL RELEASE     right   COLONOSCOPY  12-10-10   per Dr. Deatra Ina, clear, repeat in 7 yrs    [place] N/A 09/26/2015   Procedure: IRRIGATION AND DEBRIDEMENT PERIRECTAL ABSCESS;  Surgeon: Mickeal Skinner, MD;  Location: [facility];  Service: General;  Laterality: N/A;   KNEE ARTHROSCOPY     right   LOOP RECORDER INSERTION N/A 10/19/2016   Procedure: LOOP RECORDER INSERTION;  Surgeon: Thompson Grayer, MD;  Location: [facility];  Service: Cardiovascular;  Laterality: N/A;   ORIF ANKLE FRACTURE Right 03/24/2012   Procedure: OPEN REDUCTION INTERNAL FIXATION (ORIF) ANKLE FRACTURE;  Surgeon: Newt Minion, MD;  Location: [facility];  Service: Orthopedics;  Laterality: Right;  Open Reduction Internal Fixation Right Bimalleolar ankle fracture   POLYPECTOMY     TEE WITHOUT CARDIOVERSION N/A 10/18/2016   Procedure: TRANSESOPHAGEAL ECHOCARDIOGRAM (TEE);  Surgeon: Fay Records, MD;  Location: [facility];  Service: Cardiovascular;  Laterality: N/A;   TONSILLECTOMY     Social History   Occupational History   Not on file  Tobacco Use   Smoking status: Former Smoker    Types: Cigarettes    Quit date: 10/15/2016    Years since quitting: 2.9   Smokeless tobacco: Never Used   Tobacco comment: smokes occ.   Vaping Use   Vaping Use: Never used  Substance and Sexual Activity   Alcohol use: Yes    Alcohol/week: 0.0 standard drinks    Comment: occ   Drug use: No   Sexual activity: Not on file

## 2019-10-08 ENCOUNTER — Ambulatory Visit (INDEPENDENT_AMBULATORY_CARE_PROVIDER_SITE_OTHER): Payer: Medicare Other | Admitting: Emergency Medicine

## 2019-10-08 DIAGNOSIS — I639 Cerebral infarction, unspecified: Secondary | ICD-10-CM

## 2019-10-08 LAB — CUP PACEART REMOTE DEVICE CHECK
Date Time Interrogation Session: 20210918232236
Implantable Pulse Generator Implant Date: 20181009

## 2019-10-10 NOTE — Progress Notes (Signed)
[facility] Summary Report / Loop Recorder 

## 2019-10-24 ENCOUNTER — Encounter: Payer: Self-pay | Admitting: Family Medicine

## 2019-10-24 ENCOUNTER — Other Ambulatory Visit: Payer: Self-pay

## 2019-10-24 ENCOUNTER — Ambulatory Visit (INDEPENDENT_AMBULATORY_CARE_PROVIDER_SITE_OTHER): Payer: Medicare Other | Admitting: Family Medicine

## 2019-10-24 VITALS — BP 110/70 | HR 88 | Resp 16 | Ht 60.0 in | Wt 144.0 lb

## 2019-10-24 DIAGNOSIS — R921 Mammographic calcification found on diagnostic imaging of breast: Secondary | ICD-10-CM | POA: Diagnosis not present

## 2019-10-24 DIAGNOSIS — M545 Low back pain, unspecified: Secondary | ICD-10-CM

## 2019-10-24 DIAGNOSIS — R928 Other abnormal and inconclusive findings on diagnostic imaging of breast: Secondary | ICD-10-CM | POA: Diagnosis not present

## 2019-10-24 DIAGNOSIS — R600 Localized edema: Secondary | ICD-10-CM | POA: Diagnosis not present

## 2019-10-24 DIAGNOSIS — I119 Hypertensive heart disease without heart failure: Secondary | ICD-10-CM

## 2019-10-24 MED ORDER — FUROSEMIDE 20 MG PO TABS: 20.0000 mg | ORAL_TABLET | Freq: Every day | ORAL | 2 refills | Status: DC

## 2019-10-24 NOTE — Assessment & Plan Note (Signed)
BP adequately controlled. Continue Amlodipine 10 mg daily and Losartan 100 mg daily. Low salt diet to continue.

## 2019-10-24 NOTE — Patient Instructions (Addendum)
A few things to remember from today's visit:   Hypertension with heart disease - Plan: BASIC METABOLIC PANEL WITH GFR  Edema of both feet - Plan: BASIC METABOLIC PANEL WITH GFR, Urinalysis, Routine w reflex microscopic, Brain Natriuretic Peptide, furosemide (LASIX) 20 MG tablet  Be sure Amlodipine is 5 mg, you should be receiving 10 mg to take once daily. Elevation of legs above heart level may help. Good skin care. Furosemide 20 mg daily for now.  If you need refills please call your pharmacy. Do not use My Chart to request refills or for acute issues that need immediate attention.    Please be sure medication list is accurate. If a new problem present, please set up appointment sooner than planned today.

## 2019-10-24 NOTE — Progress Notes (Signed)
Chief Complaint  Patient presents with  . Foot Swelling   HPI: Sarah [name] is a 74 y.o. female, who is here today with above concern. 3 days ago started with feet edema. It is same am and pm.  She was eating packed spaghetti, which has "a lot of salt", so stopped it a week ago.  No new medications. Nocturia x 2-3 for the past month.  Lower back pain, bilateral. This is a new problem noted a few days ago. Stable. Pain is mild, intermittent,not radiated. It does not interfere with sleep. No hx of trauma.  Negative for saddle anesthesia or bowel/bladder function changes. No associated abdominal pain,nausea,or vomiting. Negative for fever,chills,changes in appetite,dysuria, gross hematuria,decreased urine output,or foam in urine. She has not taken OTC treatment.  HTN on Losartan 50 mg daily and Amlodipine 10 mg daily. Negative for orthopnea or PND. Heart monitor , according to pt, it has not shown   DM II: BS's "ok." Had appt for nutrition education.  No CP but "little something", ? Palpitation, for long time.  Review of Systems  Constitutional: Positive for fatigue. Negative for activity change, appetite change, fever and unexpected weight change.  HENT: Negative for mouth sores, nosebleeds and sore throat.   Eyes: Negative for redness and visual disturbance.  Respiratory: Negative for cough, shortness of breath and wheezing.   Gastrointestinal:       Negative for changes in bowel habits.  Skin: Negative for pallor and rash.  Neurological: Negative for syncope and headaches.  Rest see pertinent positives and negatives per HPI.  Current Outpatient Medications on File Prior to Visit  Medication Sig Dispense Refill  . acetaminophen (TYLENOL) 500 MG tablet Take 1,000 mg by mouth every 6 (six) hours as needed for mild pain or headache.    Marland Kitchen amLODipine (NORVASC) 10 MG tablet Take 1 tablet (10 mg total) by mouth daily. (Patient taking differently: Take 10 mg by  mouth daily. Pt reports taking 2 10 mg tablets) 90 tablet 3  . atorvastatin (LIPITOR) 80 MG tablet Take 1 tablet (80 mg total) by mouth daily at 6 PM. 90 tablet 2  . Blood Glucose Calibration (OT ULTRA/FASTTK CNTRL SOLN) SOLN     . calcium carbonate (TUMS EX) 750 MG chewable tablet Chew 1 tablet by mouth as needed for heartburn.    . clopidogrel (PLAVIX) 75 MG tablet TAKE 1 TABLET BY MOUTH DAILY 90 tablet 1  . diclofenac Sodium (VOLTAREN) 1 % GEL Apply 2 g topically 4 (four) times daily. 150 g 3  . ezetimibe (ZETIA) 10 MG tablet Take 1 tablet (10 mg total) by mouth daily. 90 tablet 3  . famotidine (PEPCID) 40 MG tablet TAKE 1 TABLET(40 MG) BY MOUTH AT BEDTIME 90 tablet 3  . insulin glargine (LANTUS SOLOSTAR) 100 UNIT/ML Solostar Pen Inject 60 Units into the skin daily. 30 mL 3  . insulin lispro (HUMALOG KWIKPEN) 200 UNIT/ML KwikPen Inject 15 Units into the skin with breakfast, with lunch, and with evening meal. 15 U 15 min before meals 15 mL 6  . Insulin Pen Needle (BD PEN NEEDLE NANO U/F) 32G X 4 MM MISC USE UP TO FOUR TIMES DAILY 200 each 2  . Lancet Devices (SIMPLE DIAGNOSTICS LANCING DEV) MISC Check BS before and 2 hours after meals. 200 each 6  . latanoprost (XALATAN) 0.005 % ophthalmic solution Place 1 drop into both eyes at bedtime.     . liraglutide (VICTOZA) 18 MG/3ML SOPN Inject 0.3 mLs (1.8  mg total) into the skin daily. 9 mL 3  . losartan (COZAAR) 50 MG tablet TAKE 1 TABLET(50 MG) BY MOUTH DAILY 90 tablet 1  . metFORMIN (GLUCOPHAGE) 500 MG tablet Take 500 mg by mouth 2 (two) times daily.    Marland Kitchen omeprazole (PRILOSEC) 20 MG capsule Take 1 capsule (20 mg total) by mouth daily. 30 capsule 3  . ONE TOUCH ULTRA TEST test strip Check BS before and 2 hours after meals. 200 each 6  . PHARMACIST CHOICE ALCOHOL 70 % PADS     . ROCKLATAN 0.02-0.005 % SOLN Place 1 drop into both eyes every evening.    . Vitamin D, Ergocalciferol, (DRISDOL) 1.25 MG (50000 UNIT) CAPS capsule TAKE ONE CAPSULE BY MOUTH  TWICE A WEEK( MONDAY AND THURSDAY) 8 capsule 0   No current facility-administered medications on file prior to visit.     Past Medical History:  Diagnosis Date  . Bilateral carpal tunnel syndrome 03/27/2019  . Boils   . Glaucoma   . Heart murmur   . HTN (hypertension)   . Hx of adenomatous colonic polyps   . Hyperlipidemia   . Osteoporosis   . Pneumonia   . Stroke ([place])   . Type II or unspecified type diabetes mellitus without mention of complication, uncontrolled    No Known Allergies  Social History   Socioeconomic History  . Marital status: Single    Spouse name: Not on file  . Number of children: Not on file  . Years of education: Not on file  . Highest education level: Not on file  Occupational History  . Not on file  Tobacco Use  . Smoking status: Former Smoker    Types: Cigarettes    Quit date: 10/15/2016    Years since quitting: 3.0  . Smokeless tobacco: Never Used  . Tobacco comment: smokes occ.   Vaping Use  . Vaping Use: Never used  Substance and Sexual Activity  . Alcohol use: Yes    Alcohol/week: 0.0 standard drinks    Comment: occ  . Drug use: No  . Sexual activity: Not on file  Other Topics Concern  . Not on file  Social History Narrative  . Not on file   Social Determinants of Health   Financial Resource Strain:   . Difficulty of Paying Living Expenses: Not on file  Food Insecurity: No Food Insecurity  . Worried About Charity fundraiser in the Last Year: Never true  . Ran Out of Food in the Last Year: Never true  Transportation Needs:   . Lack of Transportation (Medical): Not on file  . Lack of Transportation (Non-Medical): Not on file  Physical Activity:   . Days of Exercise per Week: Not on file  . Minutes of Exercise per Session: Not on file  Stress: Stress Concern Present  . Feeling of Stress : To some extent  Social Connections:   . Frequency of Communication with Friends and Family: Not on file  . Frequency of Social Gatherings  with Friends and Family: Not on file  . Attends Religious Services: Not on file  . Active Member of Clubs or Organizations: Not on file  . Attends Archivist Meetings: Not on file  . Marital Status: Not on file    Vitals:   10/24/19 1440  BP: 110/70  Pulse: 88  Resp: 16  SpO2: 96%   Body mass index is 28.12 kg/m.  Physical Exam Vitals and nursing note reviewed.  Constitutional:  General: She is not in acute distress.    Appearance: She is well-developed and normal weight.  HENT:     Head: Normocephalic and atraumatic.     Mouth/Throat:     Mouth: Mucous membranes are moist.     Pharynx: Oropharynx is clear.  Eyes:     Conjunctiva/sclera: Conjunctivae normal.  Cardiovascular:     Rate and Rhythm: Normal rate and regular rhythm.     Pulses:          Dorsalis pedis pulses are 2+ on the right side and 2+ on the left side.     Heart sounds: Murmur (Soft SEM LUSB) heard.   Pulmonary:     Effort: Pulmonary effort is normal. No respiratory distress.     Breath sounds: Normal breath sounds.  Abdominal:     Palpations: Abdomen is soft. There is no hepatomegaly or mass.     Tenderness: There is no abdominal tenderness.  Musculoskeletal:     Lumbar back: No tenderness or bony tenderness. Negative right straight leg raise test and negative left straight leg raise test.     Right lower leg: 2+ Pitting Edema present.     Left lower leg: 2+ Pitting Edema present.  Lymphadenopathy:     Cervical: No cervical adenopathy.  Skin:    General: Skin is warm.     Findings: No erythema or rash.  Neurological:     Mental Status: She is alert and oriented to person, place, and time.     Cranial Nerves: No cranial nerve deficit.     Comments: Mildly unstable gait when she first gets up.  Psychiatric:        Mood and Affect: Mood and affect normal.   ASSESSMENT AND PLAN:  Ms. Nykerria was seen today for foot swelling.  Diagnoses and all orders for this visit:  Orders  Placed This Encounter  Procedures  . MICROSCOPIC MESSAGE  . BASIC METABOLIC PANEL WITH GFR  . Urinalysis, Routine w reflex microscopic  . Brain Natriuretic Peptide   Lab Results  Component Value Date   CREATININE 0.55 (L) 10/24/2019   BUN 12 10/24/2019   NA 139 10/24/2019   K 4.5 10/24/2019   CL 106 10/24/2019   CO2 27 10/24/2019   Edema of both feet We discussed possible etiologies. LE elevation above waist level may help. Good foot care. Low salt diet. We discussed some side effects of Amlodipine.  -     furosemide (LASIX) 20 MG tablet; Take 1 tablet (20 mg total) by mouth daily.  Hypertension with heart disease BP adequately controlled. Med list has Amlodipine 10 mg 2 tabs daily. She called her house and verified that Amlodipine is 5 mg, taking 2 tabs. Amlodipine changed to 10 mg tab. Continue Losartan 50 mg daily. Monitor BP at home.  Bilateral low back pain without sciatica, unspecified chronicity Pain is not elicit during examination. Musculoskeletal most likely. Or now we will hold on imaging.  Other orders -     MICROSCOPIC MESSAGE  Return if symptoms worsen or fail to improve, for Keep next appt.   Terrianna Holsclaw G. [name], MD  [facility]. [place] office.   A few things to remember from today's visit:   Hypertension with heart disease - Plan: BASIC METABOLIC PANEL WITH GFR  Edema of both feet - Plan: BASIC METABOLIC PANEL WITH GFR, Urinalysis, Routine w reflex microscopic, Brain Natriuretic Peptide, furosemide (LASIX) 20 MG tablet  Be sure Amlodipine is 5 mg, you should  be receiving 10 mg to take once daily. Elevation of legs above heart level may help. Good skin care. Furosemide 20 mg daily for now.  If you need refills please call your pharmacy. Do not use My Chart to request refills or for acute issues that need immediate attention.    Please be sure medication list is accurate. If a new problem present, please set up appointment  sooner than planned today.

## 2019-10-25 LAB — URINALYSIS, ROUTINE W REFLEX MICROSCOPIC
Bacteria, UA: NONE SEEN /HPF
Bilirubin Urine: NEGATIVE
Hgb urine dipstick: NEGATIVE
Hyaline Cast: NONE SEEN /LPF
Ketones, ur: NEGATIVE
Leukocytes,Ua: NEGATIVE
Nitrite: NEGATIVE
Specific Gravity, Urine: 1.03 (ref 1.001–1.03)
Squamous Epithelial / HPF: NONE SEEN /HPF (ref <=5)
pH: 7 (ref 5.0–8.0)

## 2019-10-25 LAB — BASIC METABOLIC PANEL WITH GFR
BUN/Creatinine Ratio: 22 (calc) (ref 6–22)
BUN: 12 mg/dL (ref 7–25)
CO2: 27 mmol/L (ref 20–32)
Calcium: 10.1 mg/dL (ref 8.6–10.4)
Chloride: 106 mmol/L (ref 98–110)
Creat: 0.55 mg/dL — ABNORMAL LOW (ref 0.60–0.93)
GFR, Est African American: 109 mL/min/{1.73_m2} (ref >=60)
GFR, Est Non African American: 94 mL/min/{1.73_m2} (ref >=60)
Glucose, Bld: 381 mg/dL — ABNORMAL HIGH (ref 65–99)
Potassium: 4.5 mmol/L (ref 3.5–5.3)
Sodium: 139 mmol/L (ref 135–146)

## 2019-10-25 LAB — BRAIN NATRIURETIC PEPTIDE: Brain Natriuretic Peptide: 72 pg/mL (ref <100)

## 2019-10-27 ENCOUNTER — Encounter: Payer: Self-pay | Admitting: Family Medicine

## 2019-10-30 ENCOUNTER — Encounter: Payer: Self-pay | Admitting: Family Medicine

## 2019-10-30 ENCOUNTER — Telehealth (INDEPENDENT_AMBULATORY_CARE_PROVIDER_SITE_OTHER): Payer: Medicare Other | Admitting: Family Medicine

## 2019-10-30 VITALS — Ht 60.0 in

## 2019-10-30 DIAGNOSIS — R600 Localized edema: Secondary | ICD-10-CM | POA: Diagnosis not present

## 2019-10-30 DIAGNOSIS — R252 Cramp and spasm: Secondary | ICD-10-CM | POA: Diagnosis not present

## 2019-10-30 NOTE — Progress Notes (Signed)
Virtual Visit via Telephone Note  I connected with Sarah [name] on 10/19/21at  4:00 PM EDT by telephone and verified that I am speaking with the correct person using two identifiers.   I discussed the limitations, risks, security and privacy concerns of performing an evaluation and management service by telephone and the availability of in person appointments. I also discussed with the patient that there may be a patient responsible charge related to this service. The patient expressed understanding and agreed to proceed.  Location patient: home Location provider: work office Participants present for the call: patient, provider Patient did not have a visit in the prior 7 days to address this/these issue(s).   History of Present Illness: Sarah [name] is a 74 year old female with a history of DM 2, CKD 2, hypertension, CVA concerned about persistent foot edema,R>L. She was seen on 10/24/2019, when we addressed this problem. Dorsal aspect of foot, mildly tender. She is not elevating LE's. "Little puffy."  Furosemide 20 mg once recommended. She has not noted edema or erythema.  For the past 2 nights she has had lower extremity cramps. She is trying to increase potassium rich diet. Today BS 147.  She has not noted fever,chills,changes in appetite,CP,SOB,palpitations,diaphoresis, abdominal pain,N/V,or skin rash. Negative for orthopnea and PND.   Lab Results  Component Value Date   CREATININE 0.55 (L) 10/24/2019   BUN 12 10/24/2019   NA 139 10/24/2019   K 4.5 10/24/2019   CL 106 10/24/2019   CO2 27 10/24/2019   U/A 3+ glucose and protein. BNP 74  Observations/Objective: Patient sounds cheerful and well on the phone. I do not appreciate any SOB. Speech and thought processing are grossly intact.She has no calves pain with dorsiflexing right ankle. Patient reported vitals:Ht 5' (1.524 m)   BMI 28.12 kg/m   Assessment and Plan:  1. Edema of both feet We discussed  possible etiologies. Hx,lab results, and recent examination do not suggest a serious process.  We discussed lab results, 3+ protein in urine is concerning. Stressed the importance of better glucose controlled.  We discussed some side effects of Furosemide. She can try increasing dose of Furosemide from 20 mg to 40 mg x 2-3 days.  LE elevation above waist level. Good skin care.  2. Leg cramps ? Electrolyte abnormality,dehydration among some to consider. K+ rich diet. Instructed about warning sings.  Follow Up Instructions:  Return in about 8 days (around 11/07/2019).  I did not refer this patient for an OV in the next 24 hours for this/these issue(s).  I discussed the assessment and treatment plan with the patient. Sarah [name] was provided an opportunity to ask questions and all were answered. She agreed with the plan and demonstrated an understanding of the instructions.  I provided 14 minutes of non-face-to-face time during this encounter.   Sarah Stephenson [name], MD

## 2019-10-31 ENCOUNTER — Ambulatory Visit (INDEPENDENT_AMBULATORY_CARE_PROVIDER_SITE_OTHER): Payer: Medicare Other

## 2019-10-31 DIAGNOSIS — I639 Cerebral infarction, unspecified: Secondary | ICD-10-CM | POA: Diagnosis not present

## 2019-11-03 LAB — CUP PACEART REMOTE DEVICE CHECK
Date Time Interrogation Session: 20211019233240
Implantable Pulse Generator Implant Date: 20181009

## 2019-11-06 NOTE — Progress Notes (Signed)
[facility] Summary Report / Loop Recorder 

## 2019-11-07 ENCOUNTER — Other Ambulatory Visit: Payer: Self-pay

## 2019-11-07 ENCOUNTER — Encounter: Payer: Self-pay | Admitting: Family Medicine

## 2019-11-07 ENCOUNTER — Ambulatory Visit (INDEPENDENT_AMBULATORY_CARE_PROVIDER_SITE_OTHER): Payer: Medicare Other | Admitting: Family Medicine

## 2019-11-07 VITALS — BP 120/74 | HR 89 | Resp 16 | Ht 60.0 in | Wt 137.0 lb

## 2019-11-07 DIAGNOSIS — Z23 Encounter for immunization: Secondary | ICD-10-CM | POA: Diagnosis not present

## 2019-11-07 DIAGNOSIS — F419 Anxiety disorder, unspecified: Secondary | ICD-10-CM | POA: Diagnosis not present

## 2019-11-07 DIAGNOSIS — R600 Localized edema: Secondary | ICD-10-CM | POA: Diagnosis not present

## 2019-11-07 MED ORDER — SERTRALINE HCL 25 MG PO TABS: 25.0000 mg | ORAL_TABLET | Freq: Every day | ORAL | 2 refills | Status: DC

## 2019-11-07 NOTE — Progress Notes (Addendum)
Chief Complaint  Patient presents with  . Follow-up   HPI: Sarah [name] is a 74 y.o. female, who is here today to follow on recent OV.  Seen here in the office on 10/24/2019 when she was complaining about feet edema, noted initially the first week of 10/2019. Furosemide 20 mg daily were recommended.  Telephone visit on 10/30/2019 to follow this problem.  At this time he was recommended to take furosemide 20 mg 2 tablets daily for 3 days then cut back to 20 mg daily.  Foot edema has improved. She has not noted erythema or tenderness. She is now wearing compression stockings. Negative for CP,dyspnea, palpitation, orthopnea, PND, decreased urine output, or gross hematuria.  Lab Results  Component Value Date   CREATININE 0.55 (L) 10/24/2019   BUN 12 10/24/2019   NA 139 10/24/2019   K 4.5 10/24/2019   CL 106 10/24/2019   CO2 27 10/24/2019   Today she is requesting something to "calm" her down. She is taking care of her mother , who has dementia.  Problem has been going on for long time. She has not taken medication in the past.  She states that if she gets depressed or anxious, her mother will too. Negative for suicidal thoughts. Depression screen [facility] 2/9 11/07/2019 10/01/2019 08/23/2019 06/23/2018 02/13/2018  Decreased Interest 0 0 0 0 0  Down, Depressed, Hopeless 1 1 0 0 0  PHQ - 2 Score 1 1 0 0 0  Altered sleeping 1 - - - -  Tired, decreased energy 2 - - - -  Change in appetite 0 - - - -  Feeling bad or failure about yourself  1 - - - -  Trouble concentrating 1 - - - -  Moving slowly or fidgety/restless 0 - - - -  Suicidal thoughts 0 - - - -  PHQ-9 Score 6 - - - -  Difficult doing work/chores Somewhat difficult - - - -  Some recent data might be hidden   GAD 7 : Generalized Anxiety Score 11/07/2019  Nervous, Anxious, on Edge 1  Control/stop worrying 1  Worry too much - different things 0  Trouble relaxing 1  Restless 0  Easily annoyed or irritable 1    Afraid - awful might happen 0  Total GAD 7 Score 4  Anxiety Difficulty Somewhat difficult   Review of Systems  Constitutional: Positive for fatigue. Negative for appetite change and fever.  Respiratory: Negative for cough and wheezing.   Gastrointestinal: Negative for abdominal pain, nausea and vomiting.  Skin: Negative for rash and wound.  Psychiatric/Behavioral: Negative for confusion and hallucinations. The patient is nervous/anxious.   Rest see pertinent positives and negatives per HPI.  Current Outpatient Medications on File Prior to Visit  Medication Sig Dispense Refill  . acetaminophen (TYLENOL) 500 MG tablet Take 1,000 mg by mouth every 6 (six) hours as needed for mild pain or headache.    Marland Kitchen amLODipine (NORVASC) 10 MG tablet Take 1 tablet (10 mg total) by mouth daily. (Patient taking differently: Take 10 mg by mouth daily. Pt reports taking 2 10 mg tablets) 90 tablet 3  . atorvastatin (LIPITOR) 80 MG tablet Take 1 tablet (80 mg total) by mouth daily at 6 PM. 90 tablet 2  . Blood Glucose Calibration (OT ULTRA/FASTTK CNTRL SOLN) SOLN     . calcium carbonate (TUMS EX) 750 MG chewable tablet Chew 1 tablet by mouth as needed for heartburn.    . clopidogrel (PLAVIX) 75  MG tablet TAKE 1 TABLET BY MOUTH DAILY 90 tablet 1  . diclofenac Sodium (VOLTAREN) 1 % GEL Apply 2 g topically 4 (four) times daily. 150 g 3  . ezetimibe (ZETIA) 10 MG tablet Take 1 tablet (10 mg total) by mouth daily. 90 tablet 3  . famotidine (PEPCID) 40 MG tablet TAKE 1 TABLET(40 MG) BY MOUTH AT BEDTIME 90 tablet 3  . furosemide (LASIX) 20 MG tablet Take 1 tablet (20 mg total) by mouth daily. 30 tablet 2  . insulin glargine (LANTUS SOLOSTAR) 100 UNIT/ML Solostar Pen Inject 60 Units into the skin daily. 30 mL 3  . insulin lispro (HUMALOG KWIKPEN) 200 UNIT/ML KwikPen Inject 15 Units into the skin with breakfast, with lunch, and with evening meal. 15 U 15 min before meals 15 mL 6  . Insulin Pen Needle (BD PEN NEEDLE NANO  U/F) 32G X 4 MM MISC USE UP TO FOUR TIMES DAILY 200 each 2  . Lancet Devices (SIMPLE DIAGNOSTICS LANCING DEV) MISC Check BS before and 2 hours after meals. 200 each 6  . latanoprost (XALATAN) 0.005 % ophthalmic solution Place 1 drop into both eyes at bedtime.     . liraglutide (VICTOZA) 18 MG/3ML SOPN Inject 0.3 mLs (1.8 mg total) into the skin daily. 9 mL 3  . losartan (COZAAR) 50 MG tablet TAKE 1 TABLET(50 MG) BY MOUTH DAILY 90 tablet 1  . metFORMIN (GLUCOPHAGE) 500 MG tablet Take 500 mg by mouth 2 (two) times daily.    Marland Kitchen omeprazole (PRILOSEC) 20 MG capsule Take 1 capsule (20 mg total) by mouth daily. 30 capsule 3  . ONE TOUCH ULTRA TEST test strip Check BS before and 2 hours after meals. 200 each 6  . PHARMACIST CHOICE ALCOHOL 70 % PADS     . ROCKLATAN 0.02-0.005 % SOLN Place 1 drop into both eyes every evening.    . Vitamin D, Ergocalciferol, (DRISDOL) 1.25 MG (50000 UNIT) CAPS capsule TAKE ONE CAPSULE BY MOUTH TWICE A WEEK( MONDAY AND THURSDAY) 8 capsule 0   No current facility-administered medications on file prior to visit.   Past Medical History:  Diagnosis Date  . Bilateral carpal tunnel syndrome 03/27/2019  . Boils   . Glaucoma   . Heart murmur   . HTN (hypertension)   . Hx of adenomatous colonic polyps   . Hyperlipidemia   . Osteoporosis   . Pneumonia   . Stroke ([facility])   . Type II or unspecified type diabetes mellitus without mention of complication, uncontrolled    No Known Allergies  Social History   Socioeconomic History  . Marital status: Single    Spouse name: Not on file  . Number of children: Not on file  . Years of education: Not on file  . Highest education level: Not on file  Occupational History  . Not on file  Tobacco Use  . Smoking status: Former Smoker    Types: Cigarettes    Quit date: 10/15/2016    Years since quitting: 3.0  . Smokeless tobacco: Never Used  . Tobacco comment: smokes occ.   Vaping Use  . Vaping Use: Never used  Substance and  Sexual Activity  . Alcohol use: Yes    Alcohol/week: 0.0 standard drinks    Comment: occ  . Drug use: No  . Sexual activity: Not on file  Other Topics Concern  . Not on file  Social History Narrative  . Not on file   Social Determinants of Health   Financial  Resource Strain:   . Difficulty of Paying Living Expenses: Not on file  Food Insecurity: No Food Insecurity  . Worried About Charity fundraiser in the Last Year: Never true  . Ran Out of Food in the Last Year: Never true  Transportation Needs:   . Lack of Transportation (Medical): Not on file  . Lack of Transportation (Non-Medical): Not on file  Physical Activity:   . Days of Exercise per Week: Not on file  . Minutes of Exercise per Session: Not on file  Stress: Stress Concern Present  . Feeling of Stress : To some extent  Social Connections:   . Frequency of Communication with Friends and Family: Not on file  . Frequency of Social Gatherings with Friends and Family: Not on file  . Attends Religious Services: Not on file  . Active Member of Clubs or Organizations: Not on file  . Attends Archivist Meetings: Not on file  . Marital Status: Not on file    Vitals:   11/07/19 1450  BP: 120/74  Pulse: 89  Resp: 16  SpO2: 97%   Body mass index is 26.76 kg/m.  Physical Exam Vitals and nursing note reviewed.  Constitutional:      General: She is not in acute distress.    Appearance: She is well-developed.  HENT:     Head: Normocephalic and atraumatic.  Eyes:     Conjunctiva/sclera: Conjunctivae normal.     Pupils: Pupils are equal, round, and reactive to light.  Cardiovascular:     Rate and Rhythm: Normal rate and regular rhythm.     Heart sounds: No murmur heard.      Comments: Trace LE edema. DP pulses present. Pulmonary:     Effort: Pulmonary effort is normal. No respiratory distress.     Breath sounds: Normal breath sounds.  Abdominal:     Palpations: Abdomen is soft. There is no hepatomegaly  or mass.     Tenderness: There is no abdominal tenderness.  Musculoskeletal:     Right lower leg: Pitting Edema present.     Left lower leg: Pitting Edema present.  Lymphadenopathy:     Cervical: No cervical adenopathy.  Skin:    General: Skin is warm.     Findings: No erythema or rash.  Neurological:     Mental Status: She is alert and oriented to person, place, and time.     Cranial Nerves: No cranial nerve deficit.     Comments: Mildly unstable gait, not assisted.  Psychiatric:        Mood and Affect: Mood is anxious. Affect is labile.   ASSESSMENT AND PLAN:  Ms. Larae was seen today for follow-up.  Diagnoses and all orders for this visit:  Bilateral lower extremity edema Problem has improved. Recommend compression stockings and LE elevation above waist level a few times throughout the day. Continue furosemide 20 mg daily. We discussed some side effects of diuretic treatment. Appropriate skin care.  Need for influenza vaccination -     Flu Vaccine QUAD High Dose(Fluad)  Anxiety disorder, unspecified type Her mother took sertraline in the past, she would like to try this medication. We discussed some side effects. Instructed about warning signs. Follow-up in 8 weeks, before if needed.  -     sertraline (ZOLOFT) 25 MG tablet; Take 1 tablet (25 mg total) by mouth daily.   Return in about 2 months (around 01/07/2020).   Fisher Hargadon G. [name], MD  [facility]. [place] office.  A few things to remember from today's visit:   Bilateral lower extremity edema Compression stockings recommended as well as elevation of legs above your heart level. Good skin care.  Today we started Sertraline, this type of medications can increase suicidal risk. This is more prevalent among children,adolecents, and young adults with major depression or other psychiatric disorders. It can also make depression worse. Most common side effects are gastrointestinal, self limited  after a few weeks: diarrhea, nausea, constipation  Or diarrhea among some.  In general it is well tolerated. We will follow closely.  If you need refills please call your pharmacy. Do not use My Chart to request refills or for acute issues that need immediate attention.    Please be sure medication list is accurate. If a new problem present, please set up appointment sooner than planned today.

## 2019-11-07 NOTE — Patient Instructions (Signed)
A few things to remember from today's visit:   Bilateral lower extremity edema Compression stockings recommended as well as elevation of legs above your heart level. Good skin care.  Today we started Sertraline, this type of medications can increase suicidal risk. This is more prevalent among children,adolecents, and young adults with major depression or other psychiatric disorders. It can also make depression worse. Most common side effects are gastrointestinal, self limited after a few weeks: diarrhea, nausea, constipation  Or diarrhea among some.  In general it is well tolerated. We will follow closely.  If you need refills please call your pharmacy. Do not use My Chart to request refills or for acute issues that need immediate attention.    Please be sure medication list is accurate. If a new problem present, please set up appointment sooner than planned today.

## 2019-11-13 ENCOUNTER — Ambulatory Visit: Payer: Medicare Other | Admitting: Dietician

## 2019-11-20 ENCOUNTER — Ambulatory Visit: Payer: Medicare Other | Admitting: Internal Medicine

## 2019-11-21 DIAGNOSIS — E113293 Type 2 diabetes mellitus with mild nonproliferative diabetic retinopathy without macular edema, bilateral: Secondary | ICD-10-CM | POA: Diagnosis not present

## 2019-12-02 ENCOUNTER — Other Ambulatory Visit: Payer: Self-pay | Admitting: Family Medicine

## 2019-12-03 ENCOUNTER — Ambulatory Visit (INDEPENDENT_AMBULATORY_CARE_PROVIDER_SITE_OTHER): Payer: Medicare Other

## 2019-12-03 DIAGNOSIS — I639 Cerebral infarction, unspecified: Secondary | ICD-10-CM | POA: Diagnosis not present

## 2019-12-03 LAB — CUP PACEART REMOTE DEVICE CHECK
Date Time Interrogation Session: 20211119223610
Implantable Pulse Generator Implant Date: 20181009

## 2019-12-05 NOTE — Progress Notes (Signed)
[facility] Summary Report / Loop Recorder 

## 2019-12-24 ENCOUNTER — Ambulatory Visit: Payer: Medicare Other | Admitting: Dietician

## 2019-12-28 ENCOUNTER — Other Ambulatory Visit: Payer: Self-pay | Admitting: Family Medicine

## 2020-01-01 ENCOUNTER — Ambulatory Visit (INDEPENDENT_AMBULATORY_CARE_PROVIDER_SITE_OTHER): Payer: Medicare Other

## 2020-01-01 DIAGNOSIS — I639 Cerebral infarction, unspecified: Secondary | ICD-10-CM | POA: Diagnosis not present

## 2020-01-01 LAB — CUP PACEART REMOTE DEVICE CHECK
Date Time Interrogation Session: 20211220223402
Implantable Pulse Generator Implant Date: 20181009

## 2020-01-07 ENCOUNTER — Other Ambulatory Visit: Payer: Self-pay | Admitting: Family Medicine

## 2020-01-07 DIAGNOSIS — E1121 Type 2 diabetes mellitus with diabetic nephropathy: Secondary | ICD-10-CM

## 2020-01-08 ENCOUNTER — Ambulatory Visit: Payer: Medicare Other | Admitting: Family Medicine

## 2020-01-08 ENCOUNTER — Other Ambulatory Visit: Payer: Self-pay | Admitting: Family Medicine

## 2020-01-08 DIAGNOSIS — I119 Hypertensive heart disease without heart failure: Secondary | ICD-10-CM

## 2020-01-15 NOTE — Progress Notes (Signed)
[facility] Summary Report / Loop Recorder 

## 2020-01-21 ENCOUNTER — Encounter: Payer: Self-pay | Admitting: Family Medicine

## 2020-01-21 ENCOUNTER — Ambulatory Visit (INDEPENDENT_AMBULATORY_CARE_PROVIDER_SITE_OTHER): Payer: Medicare Other | Admitting: Family Medicine

## 2020-01-21 ENCOUNTER — Other Ambulatory Visit: Payer: Self-pay

## 2020-01-21 VITALS — BP 120/70 | HR 99 | Resp 16 | Ht 60.0 in | Wt 134.0 lb

## 2020-01-21 DIAGNOSIS — R0609 Other forms of dyspnea: Secondary | ICD-10-CM

## 2020-01-21 DIAGNOSIS — F419 Anxiety disorder, unspecified: Secondary | ICD-10-CM | POA: Insufficient documentation

## 2020-01-21 DIAGNOSIS — R600 Localized edema: Secondary | ICD-10-CM | POA: Insufficient documentation

## 2020-01-21 DIAGNOSIS — E1129 Type 2 diabetes mellitus with other diabetic kidney complication: Secondary | ICD-10-CM

## 2020-01-21 DIAGNOSIS — R252 Cramp and spasm: Secondary | ICD-10-CM | POA: Diagnosis not present

## 2020-01-21 DIAGNOSIS — I119 Hypertensive heart disease without heart failure: Secondary | ICD-10-CM

## 2020-01-21 DIAGNOSIS — R809 Proteinuria, unspecified: Secondary | ICD-10-CM

## 2020-01-21 DIAGNOSIS — Z794 Long term (current) use of insulin: Secondary | ICD-10-CM | POA: Diagnosis not present

## 2020-01-21 DIAGNOSIS — R0600 Dyspnea, unspecified: Secondary | ICD-10-CM

## 2020-01-21 NOTE — Patient Instructions (Signed)
A few things to remember from today's visit:   Bilateral lower extremity edema - Plan: Basic metabolic panel  Anxiety disorder, unspecified type  Hypertension with heart disease - Plan: Basic metabolic panel  DOE (dyspnea on exertion) - Plan: Ambulatory referral to Cardiology  Bilateral leg cramps - Plan: CK, Magnesium, Basic metabolic panel  If you need refills please call your pharmacy. Do not use My Chart to request refills or for acute issues that need immediate attention.   Today no changes. Compression stocking for legs. Arrange appt with endocrinologist.  Appt with heart doctor will be arranged because of new shortness of breath.If associated chest pain,palpitations,or feeling clammy you need to call 911.    Leg Cramps Leg cramps occur when one or more muscles tighten and a person has no control over it (involuntary muscle contraction). Muscle cramps are most common in the calf muscles of the leg. They can occur during exercise or at rest. Leg cramps are painful, and they may last for a few seconds to a few minutes. Cramps may return several times before they finally stop. Usually, leg cramps are not caused by a serious medical problem. In many cases, the cause is not known. Some common causes include:  Excessive physical effort (overexertion), such as during intense exercise.  Doing the same motion over and over.  Staying in a certain position for a long period of time.  Improper preparation, form, or technique while doing a sport or an activity.  Dehydration.  Injury.  Side effects of certain medicines.  Abnormally low levels of minerals in your blood (electrolytes), especially potassium and calcium. This could result from: ? Pregnancy. ? Taking diuretic medicines. Follow these instructions at home: Eating and drinking  Drink enough fluid to keep your urine pale yellow. Staying hydrated may help prevent cramps.  Eat a healthy diet that includes plenty of  nutrients to help your muscles function. A healthy diet includes fruits and vegetables, lean protein, whole grains, and low-fat or nonfat dairy products. Managing pain, stiffness, and swelling  Try massaging, stretching, and relaxing the affected muscle. Do this for several minutes at a time.  If directed, put ice on areas that are sore or painful after a cramp. To do this: ? Put ice in a plastic bag. ? Place a towel between your skin and the bag. ? Leave the ice on for 20 minutes, 2-3 times a day. ? Remove the ice if your skin turns bright red. This is very important. If you cannot feel pain, heat, or cold, you have a greater risk of damage to the area.  If directed, apply heat to muscles that are tense or tight. Do this before you exercise, or as often as told by your health care provider. Use the heat source that your health care provider recommends, such as a moist heat pack or a heating pad. To do this: ? Place a towel between your skin and the heat source. ? Leave the heat on for 20-30 minutes. ? Remove the heat if your skin turns bright red. This is especially important if you are unable to feel pain, heat, or cold. You may have a greater risk of getting burned.  Try taking hot showers or baths to help relax tight muscles.      General instructions  If you are having frequent leg cramps, avoid intense exercise for several days.  Take over-the-counter and prescription medicines only as told by your health care provider.  Keep all follow-up visits.  This is important. Contact a health care provider if:  Your leg cramps get more severe or more frequent, or they do not improve over time.  Your foot becomes cold, numb, or blue. Summary  Muscle cramps can develop in any muscle, but the most common place is in the calf muscles of the leg.  Leg cramps are painful, and they may last for a few seconds to a few minutes.  Usually, leg cramps are not caused by a serious medical problem.  Often, the cause is not known.  Stay hydrated, and take over-the-counter and prescription medicines only as told by your health care provider. This information is not intended to replace advice given to you by your health care provider. Make sure you discuss any questions you have with your health care provider. Document Revised: 05/16/2019 Document Reviewed: 05/16/2019 Elsevier Patient Education  [place].   Please be sure medication list is accurate. If a new problem present, please set up appointment sooner than planned today.

## 2020-01-21 NOTE — Assessment & Plan Note (Signed)
Chronic. We discussed possible etiologies. Some of her medications and chronic health problems could be contributing factors. Adequate hydration. Further recommendations according to lab results.

## 2020-01-21 NOTE — Assessment & Plan Note (Signed)
Improved. Sertraline 25 mg daily has helped, she prefers to continue same dose, so no changes.

## 2020-01-21 NOTE — Assessment & Plan Note (Signed)
Problem has improved. Adequate skin care. LE elevation and compression stockings recommended. Continue furosemide 20 mg daily.

## 2020-01-21 NOTE — Progress Notes (Signed)
HPI: Sarah [name] is a 75 y.o. female, who is here today to follow on recent OV. Sarah [name] was last seen on 11/07/2019, when Sarah [name] was concerned about lower extremity edema. Problem has improved. Currently Sarah [name] is on furosemide 20 mg daily. Lower extremity edema seems to be worse at the end of the day. Alleviated by elevation.  Hypertension: Currently Sarah [name] is on amlodipine 10 mg daily and losartan 50 mg daily. + CVD. Negative for severe/frequent headache, visual changes, claudication, or new focal weakness.  Lab Results  Component Value Date   CREATININE 0.55 (L) 10/24/2019   BUN 12 10/24/2019   NA 139 10/24/2019   K 4.5 10/24/2019   CL 106 10/24/2019   CO2 27 10/24/2019   Cramps: Sarah [name] has had problem "forever", last one was 5 days ago, at night mainly. Sarah [name] has not noted erythema. Sarah [name] has not identified exacerbating factors. Sarah [name] has not tried OTC medications.  DM2: Sarah [name] has not followed with endocrinologist, cancelled appt with Dr Kelton Pillar (11/20/19). BS's are "not good." Sarah [name] cancelled last appt with nutritionist, planning on rescheduling.  Negative for polydipsia and polyphagia. + Polyuria. Nocturia x2. Sarah [name] has not noted dysuria, gross hematuria, or foamy urine.  Lab Results  Component Value Date   HGBA1C 12.5 (H) 08/01/2019   Anxiety: Currently Sarah [name] is on sertraline 25 mg daily, started in 10/2019. Problem is exacerbated by her mother's health problems; Sarah [name] is her caregiver. Sarah [name] feels that medication has helped some. Sarah [name] has tolerated medication well.  SOB for the past 2 weeks when going up stairs. This is a new problem. Negative for associated CP, palpitation, or diaphoresis. Sarah [name] has a cardiac loop recorder that is interrogated remotely every few weeks and according to pt, cardiac arrhythmias have not been reported. Negative for orthopnea and PND. Echo on 02/09/2018: LVEF 60 to [id number], diastolic dysfunction.  Review of Systems  Constitutional: Positive for  fatigue. Negative for activity change, appetite change and fever.  HENT: Negative for mouth sores, nosebleeds and sore throat.   Respiratory: Negative for cough and wheezing.   Gastrointestinal: Negative for abdominal pain, nausea and vomiting.       Negative for changes in bowel habits.  Musculoskeletal: Positive for arthralgias and gait problem.  Skin: Negative for rash and wound.  Neurological: Negative for dizziness, syncope and facial asymmetry.  Psychiatric/Behavioral: Negative for confusion.  Rest see pertinent positives and negatives per HPI.  Current Outpatient Medications on File Prior to Visit  Medication Sig Dispense Refill  . acetaminophen (TYLENOL) 500 MG tablet Take 1,000 mg by mouth every 6 (six) hours as needed for mild pain or headache.    Marland Kitchen amLODipine (NORVASC) 10 MG tablet Take 1 tablet (10 mg total) by mouth daily. (Patient taking differently: Take 10 mg by mouth daily. Pt reports taking 2 10 mg tablets) 90 tablet 3  . atorvastatin (LIPITOR) 80 MG tablet Take 1 tablet (80 mg total) by mouth daily at 6 PM. 90 tablet 2  . Blood Glucose Calibration (OT ULTRA/FASTTK CNTRL SOLN) SOLN     . calcium carbonate (TUMS EX) 750 MG chewable tablet Chew 1 tablet by mouth as needed for heartburn.    . clopidogrel (PLAVIX) 75 MG tablet TAKE 1 TABLET BY MOUTH DAILY 90 tablet 1  . diclofenac Sodium (VOLTAREN) 1 % GEL Apply 2 g topically 4 (four) times daily. 150 g 3  . ezetimibe (ZETIA) 10 MG tablet Take 1 tablet (10 mg total) by mouth daily. 90 tablet 3  .  famotidine (PEPCID) 40 MG tablet TAKE 1 TABLET(40 MG) BY MOUTH AT BEDTIME 90 tablet 3  . furosemide (LASIX) 20 MG tablet Take 1 tablet (20 mg total) by mouth daily. 30 tablet 2  . insulin glargine (LANTUS SOLOSTAR) 100 UNIT/ML Solostar Pen Inject 60 Units into the skin daily. 30 mL 3  . insulin lispro (HUMALOG KWIKPEN) 200 UNIT/ML KwikPen Inject 15 Units into the skin with breakfast, with lunch, and with evening meal. 15 U 15 min before  meals 15 mL 6  . Insulin Pen Needle (BD PEN NEEDLE NANO U/F) 32G X 4 MM MISC USE UP TO FOUR TIMES DAILY 200 each 2  . Lancet Devices (SIMPLE DIAGNOSTICS LANCING DEV) MISC Check BS before and 2 hours after meals. 200 each 6  . latanoprost (XALATAN) 0.005 % ophthalmic solution Place 1 drop into both eyes at bedtime.     Marland Kitchen losartan (COZAAR) 50 MG tablet TAKE 1 TABLET(50 MG) BY MOUTH DAILY 90 tablet 1  . omeprazole (PRILOSEC) 20 MG capsule Take 1 capsule (20 mg total) by mouth daily. 30 capsule 3  . ONE TOUCH ULTRA TEST test strip Check BS before and 2 hours after meals. 200 each 6  . PHARMACIST CHOICE ALCOHOL 70 % PADS     . ROCKLATAN 0.02-0.005 % SOLN Place 1 drop into both eyes every evening.    . sertraline (ZOLOFT) 25 MG tablet Take 1 tablet (25 mg total) by mouth daily. 30 tablet 2  . VICTOZA 18 MG/3ML SOPN ADMINISTER 1.8 MG UNDER THE SKIN DAILY 9 mL 3  . Vitamin D, Ergocalciferol, (DRISDOL) 1.25 MG (50000 UNIT) CAPS capsule TAKE ONE CAPSULE BY MOUTH TWICE A WEEK( MONDAY AND THURSDAY) 8 capsule 0   No current facility-administered medications on file prior to visit.   Past Medical History:  Diagnosis Date  . Bilateral carpal tunnel syndrome 03/27/2019  . Boils   . Glaucoma   . Heart murmur   . HTN (hypertension)   . Hx of adenomatous colonic polyps   . Hyperlipidemia   . Osteoporosis   . Pneumonia   . Stroke ([place])   . Type II or unspecified type diabetes mellitus without mention of complication, uncontrolled    No Known Allergies  Social History   Socioeconomic History  . Marital status: Single    Spouse name: Not on file  . Number of children: Not on file  . Years of education: Not on file  . Highest education level: Not on file  Occupational History  . Not on file  Tobacco Use  . Smoking status: Former Smoker    Types: Cigarettes    Quit date: 10/15/2016    Years since quitting: 3.2  . Smokeless tobacco: Never Used  . Tobacco comment: smokes occ.   Vaping Use  .  Vaping Use: Never used  Substance and Sexual Activity  . Alcohol use: Yes    Alcohol/week: 0.0 standard drinks    Comment: occ  . Drug use: No  . Sexual activity: Not on file  Other Topics Concern  . Not on file  Social History Narrative  . Not on file   Social Determinants of Health   Financial Resource Strain: Not on file  Food Insecurity: No Food Insecurity  . Worried About Charity fundraiser in the Last Year: Never true  . Ran Out of Food in the Last Year: Never true  Transportation Needs: Not on file  Physical Activity: Not on file  Stress: Stress Concern Present  .  Feeling of Stress : To some extent  Social Connections: Not on file   Vitals:   01/21/20 1535  BP: 120/70  Pulse: 99  Resp: 16  SpO2: 97%   Wt Readings from Last 3 Encounters:  01/21/20 134 lb (60.8 kg)  11/07/19 137 lb (62.1 kg)  10/24/19 144 lb (65.3 kg)   Body mass index is 26.17 kg/m.  Physical Exam Vitals and nursing note reviewed.  Constitutional:      General: Sarah [name] is not in acute distress.    Appearance: Sarah [name] is well-developed.  HENT:     Head: Normocephalic and atraumatic.     Mouth/Throat:     Mouth: Oropharynx is clear and moist and mucous membranes are normal. Mucous membranes are moist.     Pharynx: Oropharynx is clear.  Eyes:     Conjunctiva/sclera: Conjunctivae normal.  Cardiovascular:     Rate and Rhythm: Normal rate and regular rhythm.     Pulses:          Dorsalis pedis pulses are 2+ on the right side and 2+ on the left side.     Heart sounds: Murmur (SEM I/VI RUSB) heard.    Pulmonary:     Effort: Pulmonary effort is normal. No respiratory distress.     Breath sounds: Normal breath sounds.  Abdominal:     Palpations: Abdomen is soft. There is no hepatomegaly or mass.     Tenderness: There is no abdominal tenderness.  Musculoskeletal:     Right lower leg: 1+ Pitting Edema present.     Left lower leg: 1+ Pitting Edema present.  Lymphadenopathy:     Cervical: No  cervical adenopathy.  Skin:    General: Skin is warm.     Findings: No erythema or rash.  Neurological:     Mental Status: Sarah [name] is alert and oriented to person, place, and time.     Deep Tendon Reflexes: Strength normal.     Comments: Mildly unstable gait, not assisted.  Psychiatric:        Mood and Affect: Mood and affect normal.     Comments: Well groomed, good eye contact.   ASSESSMENT AND PLAN:  [name] was seen today for follow-up.  Diagnoses and all orders for this visit:  Orders Placed This Encounter  Procedures  . CK  . Magnesium  . Basic metabolic panel  . Ambulatory referral to Cardiology   Lab Results  Component Value Date   CREATININE 0.72 01/21/2020   BUN 13 01/21/2020   NA 135 01/21/2020   K 4.2 01/21/2020   CL 101 01/21/2020   CO2 28 01/21/2020   Estimated Creatinine Clearance: 51.8 mL/min (by C-G formula based on SCr of 0.72 mg/dL).  DOE (dyspnea on exertion) Currently asymptomatic. Loop recorder negative has been negative for rhythm abnormalities. High CV risk, Sarah [name] may need a stress test. Cardiology referral placed. Clearly instructed about warning signs.  Hypertension with heart disease BP adequately controlled. Continue amlodipine 10 mg daily and losartan 50 mg daily. Low-salt diet. Continue monitoring BP regularly.  Type 2 diabetes mellitus with microalbuminuria, with long-term current use of insulin (HCC) Problem has not been well controlled. Strongly recommend arranging appt with endo.  Bilateral lower extremity edema Problem has improved. Adequate skin care. LE elevation and compression stockings recommended. Continue furosemide 20 mg daily.   Bilateral leg cramps Chronic. We discussed possible etiologies. Some of her medications and chronic health problems could be contributing factors. Adequate hydration. Further recommendations according to lab results.  Anxiety disorder Improved. Sertraline 25 mg daily has helped, Sarah [name]  prefers to continue same dose, so no changes.   Spent 42 minutes.  During this time history was obtained and documented, examination was performed, prior labs/imaging reviewed, and assessment/plan discussed.  Return in about 6 months (around 07/20/2020).   Emoni Whitworth G. [name], MD  [facility]. [place] office.    A few things to remember from today's visit:   Bilateral lower extremity edema - Plan: Basic metabolic panel  Anxiety disorder, unspecified type  Hypertension with heart disease - Plan: Basic metabolic panel  DOE (dyspnea on exertion) - Plan: Ambulatory referral to Cardiology  Bilateral leg cramps - Plan: CK, Magnesium, Basic metabolic panel  If you need refills please call your pharmacy. Do not use My Chart to request refills or for acute issues that need immediate attention.   Today no changes. Compression stocking for legs. Arrange appt with endocrinologist.  Appt with heart doctor will be arranged because of new shortness of breath.If associated chest pain,palpitations,or feeling clammy you need to call 911.    Leg Cramps Leg cramps occur when one or more muscles tighten and a person has no control over it (involuntary muscle contraction). Muscle cramps are most common in the calf muscles of the leg. They can occur during exercise or at rest. Leg cramps are painful, and they may last for a few seconds to a few minutes. Cramps may return several times before they finally stop. Usually, leg cramps are not caused by a serious medical problem. In many cases, the cause is not known. Some common causes include:  Excessive physical effort (overexertion), such as during intense exercise.  Doing the same motion over and over.  Staying in a certain position for a long period of time.  Improper preparation, form, or technique while doing a sport or an activity.  Dehydration.  Injury.  Side effects of certain medicines.  Abnormally low levels of minerals  in your blood (electrolytes), especially potassium and calcium. This could result from: ? Pregnancy. ? Taking diuretic medicines. Follow these instructions at home: Eating and drinking  Drink enough fluid to keep your urine pale yellow. Staying hydrated may help prevent cramps.  Eat a healthy diet that includes plenty of nutrients to help your muscles function. A healthy diet includes fruits and vegetables, lean protein, whole grains, and low-fat or nonfat dairy products. Managing pain, stiffness, and swelling  Try massaging, stretching, and relaxing the affected muscle. Do this for several minutes at a time.  If directed, put ice on areas that are sore or painful after a cramp. To do this: ? Put ice in a plastic bag. ? Place a towel between your skin and the bag. ? Leave the ice on for 20 minutes, 2-3 times a day. ? Remove the ice if your skin turns bright red. This is very important. If you cannot feel pain, heat, or cold, you have a greater risk of damage to the area.  If directed, apply heat to muscles that are tense or tight. Do this before you exercise, or as often as told by your health care provider. Use the heat source that your health care provider recommends, such as a moist heat pack or a heating pad. To do this: ? Place a towel between your skin and the heat source. ? Leave the heat on for 20-30 minutes. ? Remove the heat if your skin turns bright red. This is especially important if you are unable to feel  pain, heat, or cold. You may have a greater risk of getting burned.  Try taking hot showers or baths to help relax tight muscles.      General instructions  If you are having frequent leg cramps, avoid intense exercise for several days.  Take over-the-counter and prescription medicines only as told by your health care provider.  Keep all follow-up visits. This is important. Contact a health care provider if:  Your leg cramps get more severe or more frequent, or they  do not improve over time.  Your foot becomes cold, numb, or blue. Summary  Muscle cramps can develop in any muscle, but the most common place is in the calf muscles of the leg.  Leg cramps are painful, and they may last for a few seconds to a few minutes.  Usually, leg cramps are not caused by a serious medical problem. Often, the cause is not known.  Stay hydrated, and take over-the-counter and prescription medicines only as told by your health care provider. This information is not intended to replace advice given to you by your health care provider. Make sure you discuss any questions you have with your health care provider. Document Revised: 05/16/2019 Document Reviewed: 05/16/2019 Elsevier Patient Education  [place].   Please be sure medication list is accurate. If a new problem present, please set up appointment sooner than planned today.

## 2020-01-21 NOTE — Assessment & Plan Note (Signed)
Problem has not been well controlled. Strongly recommend arranging appt with endo.

## 2020-01-21 NOTE — Assessment & Plan Note (Addendum)
BP adequately controlled. Continue amlodipine 10 mg daily and losartan 50 mg daily. Low-salt diet. Continue monitoring BP regularly.

## 2020-01-22 LAB — BASIC METABOLIC PANEL
BUN: 13 mg/dL (ref 6–23)
CO2: 28 mEq/L (ref 19–32)
Calcium: 9.7 mg/dL (ref 8.4–10.5)
Chloride: 101 mEq/L (ref 96–112)
Creatinine, Ser: 0.72 mg/dL (ref 0.40–1.20)
GFR: 83.5 mL/min (ref >60.00)
Glucose, Bld: 457 mg/dL — ABNORMAL HIGH (ref 70–99)
Potassium: 4.2 mEq/L (ref 3.5–5.1)
Sodium: 135 mEq/L (ref 135–145)

## 2020-01-22 LAB — CK: Total CK: 228 U/L — ABNORMAL HIGH (ref 7–177)

## 2020-01-22 LAB — MAGNESIUM: Magnesium: 1.9 mg/dL (ref 1.5–2.5)

## 2020-01-24 ENCOUNTER — Ambulatory Visit: Payer: Medicare Other | Admitting: Internal Medicine

## 2020-01-31 ENCOUNTER — Telehealth: Payer: Self-pay | Admitting: Family Medicine

## 2020-01-31 NOTE — Telephone Encounter (Signed)
Pt is calling in stating that she is calling to get her lab results and would like to see if they can please speak with her.  Pt would like to have a call back.

## 2020-02-01 ENCOUNTER — Ambulatory Visit (INDEPENDENT_AMBULATORY_CARE_PROVIDER_SITE_OTHER): Payer: Medicare Other

## 2020-02-01 DIAGNOSIS — I639 Cerebral infarction, unspecified: Secondary | ICD-10-CM | POA: Diagnosis not present

## 2020-02-01 LAB — CUP PACEART REMOTE DEVICE CHECK
Date Time Interrogation Session: 20220120223826
Implantable Pulse Generator Implant Date: 20181009

## 2020-02-01 NOTE — Telephone Encounter (Signed)
See result note.  

## 2020-02-07 ENCOUNTER — Encounter: Payer: Self-pay | Admitting: Dietician

## 2020-02-07 ENCOUNTER — Encounter: Payer: Medicare Other | Attending: Internal Medicine | Admitting: Dietician

## 2020-02-07 ENCOUNTER — Other Ambulatory Visit: Payer: Self-pay

## 2020-02-07 DIAGNOSIS — E1142 Type 2 diabetes mellitus with diabetic polyneuropathy: Secondary | ICD-10-CM

## 2020-02-07 DIAGNOSIS — Z794 Long term (current) use of insulin: Secondary | ICD-10-CM | POA: Diagnosis not present

## 2020-02-07 NOTE — Progress Notes (Signed)
Diabetes Self-Management Education  Visit Type: Follow-up  Appt. Start Time: 1300 Appt. End Time: 1440  02/07/2020  [name], identified by name and date of birth, is a 75 y.o. female with a diagnosis of Diabetes:  .   ASSESSMENT Patient is here today alone.  She was last seen by myself 09/2019. She reports increase stress.    - Cares for 109 yo mother who cannot be left alone.  She does get support from her sister to stay with her mother when she needs to get out.  - Cares for 67 yo nephew every other week.  - Incontinence at night at times since starting on Lasix She puts together puzzles to help with stress and used to enjoy reading but states that she cannot focus.  Discussed the effects of uncontrolled blood sugar on vision. Patient states that she has some depression (She does not feel it interferes with her diabetes care although is more disorganized feeling, no thoughts of harming herself or others.)  She has not started the prescribed Zoloft yet.  Recommended that she start this medication to see if this helps her symptoms.  (Mother and sister take Zoloft.) She continues to drink 1 1/2 cans (66 grams carbohydrates) daily. She is skipping meals at times She did not take her insulin (Lantus or Novolog) today. Blood glucose in our office is 533.  She denies nausea. She is not checking her blood sugar often. Labs 01/21/2020 include glucose 457.   Complains of shortness of breath at times and is to see a cardiologist February 12, 2020.   History includes Type 2 Diabetes (for about 30 years), HTN, dyslipidemia, stroke A1C 12.5% and Fructosamine 331 08/01/19, 12.3% 04/27/2019 Medication includes Lantus 60 units q am, Humalog 15 units before meals (sometimes only has coffee in the am but takes Humalog at times without eating).  Patient lives with her 51 yo mom and cares for her.  Her sister helps when possible.  States that her mom is negative and this causes Sarah [name] to  struggle with depression.  She does the shopping and cooking.  Mom has lost her taste buds and finding something mom enjoys is difficult.  She is retired and worked for [employer] for 30 years and moved back from [place] 16 years ago. Walks in the neighborhood and has a two story house.  Weight 133 lb (60.3 kg). Body mass index is 25.97 kg/m.   Diabetes Self-Management Education - 02/07/20 1421      Visit Information   Visit Type Follow-up      Psychosocial Assessment   Patient Belief/Attitude about Diabetes Defeat/Burnout    Self-management support Doctor's office;CDE visits    Other persons present Patient    Patient Concerns Nutrition/Meal planning;Glycemic Control    Special Needs None    Preferred Learning Style No preference indicated      Pre-Education Assessment   Patient understands the diabetes disease and treatment process. Demonstrates understanding / competency    Patient understands incorporating nutritional management into lifestyle. Needs Review    Patient undertands incorporating physical activity into lifestyle. Needs Review    Patient understands using medications safely. Needs Review    Patient understands monitoring blood glucose, interpreting and using results Needs Review    Patient understands prevention, detection, and treatment of acute complications. Needs Review    Patient understands prevention, detection, and treatment of chronic complications. Needs Review    Patient understands how to develop strategies to address psychosocial issues. Needs Review  Patient understands how to develop strategies to promote health/change behavior. Needs Review      Complications   How often do you check your blood sugar? 3-4 times / week    Fasting Blood glucose range (mg/dL) >200    Postprandial Blood glucose range (mg/dL) >200      Dietary Intake   Breakfast donut, OJ    Lunch egg salad or chicken salad sandwich and soup OR leftovers    Dinner meat loaf, macaroni  and cheese, broccoli, apple pie    Beverage(s) water, regular Pepsi (1 1/2 cans daily), Snapple, coffee with sugar free coffeemate      Exercise   Exercise Type Light (walking / raking leaves)    How many days per week to you exercise? 5    How many minutes per day do you exercise? 10    Total minutes per week of exercise 50      Patient Education   Previous Diabetes Education Yes (please comment)   09/2019   Nutrition management  Other (comment)   beverage choices and comparison to food   Physical activity and exercise  Role of exercise on diabetes management, blood pressure control and cardiac health.;Helped patient identify appropriate exercises in relation to his/her diabetes, diabetes complications and other health issue.    Medications Reviewed patients medication for diabetes, action, purpose, timing of dose and side effects.    Monitoring Other (comment)   importance of consistency   Acute complications Taught treatment of hypoglycemia - the 15 rule.    Chronic complications Relationship between chronic complications and blood glucose control;Retinopathy and reason for yearly dilated eye exams    Psychosocial adjustment Worked with patient to identify barriers to care and solutions;Role of stress on diabetes    Personal strategies to promote health Lifestyle issues that need to be addressed for better diabetes care      Individualized Goals (developed by patient)   Nutrition General guidelines for healthy choices and portions discussed    Physical Activity Exercise 5-7 days per week;15 minutes per day    Medications take my medication as prescribed    Monitoring  test my blood glucose as discussed    Reducing Risk examine blood glucose patterns    Health Coping discuss diabetes with (comment)   MD, RD, CDCES     Patient Self-Evaluation of Goals - Patient rates self as meeting previously set goals (% of time)   Nutrition 50 - 75 %    Physical Activity 50 - 75 %    Medications 50  - 75 %    Monitoring 25 - 50%    Problem Solving 25 - 50%    Reducing Risk 25 - 50%    Health Coping 50 - 75 %      Post-Education Assessment   Patient understands the diabetes disease and treatment process. Demonstrates understanding / competency    Patient understands incorporating nutritional management into lifestyle. Needs Review    Patient undertands incorporating physical activity into lifestyle. Demonstrates understanding / competency    Patient understands using medications safely. Demonstrates understanding / competency    Patient understands monitoring blood glucose, interpreting and using results Needs Review    Patient understands prevention, detection, and treatment of acute complications. Needs Review    Patient understands prevention, detection, and treatment of chronic complications. Demonstrates understanding / competency    Patient understands how to develop strategies to address psychosocial issues. Demonstrates understanding / competency    Patient understands how  to develop strategies to promote health/change behavior. Needs Review      Outcomes   Expected Outcomes Other (comment)   Interest in learning but with increased barriers   Future DMSE 4-6 wks    Program Status Not Completed      Subsequent Visit   Since your last visit have you continued or begun to take your medications as prescribed? No    Since your last visit have you experienced any weight changes? Loss    Weight Loss (lbs) 2    Since your last visit, are you checking your blood glucose at least once a day? No           Individualized Plan for Diabetes Self-Management Training:   Learning Objective:  Patient will have a greater understanding of diabetes self-management. Patient education plan is to attend individual and/or group sessions per assessed needs and concerns.   Plan:   Patient Instructions  Goal:  (patient directed) Exercise daily (walking)  Google walking video for  seniors Take insulin daily  Lantus 60 units each am  Humalog 15 units before each meal Reduce sugar containing beverages.  Drink water instead.   Expected Outcomes:  Other (comment) (Interest in learning but with increased barriers)  Education material provided:   If problems or questions, patient to contact team via:  Phone  Future DSME appointment: 4-6 wks

## 2020-02-07 NOTE — Patient Instructions (Signed)
Goal:  (patient directed) Exercise daily (walking)  Google walking video for seniors Take insulin daily  Lantus 60 units each am  Humalog 15 units before each meal Reduce sugar containing beverages.  Drink water instead.

## 2020-02-08 ENCOUNTER — Telehealth: Payer: Self-pay

## 2020-02-08 NOTE — Telephone Encounter (Signed)
lmovm for pt to call me back.

## 2020-02-08 NOTE — Telephone Encounter (Signed)
I spoke with pt. Her Blood sugar this morning was 309 and she is feeling okay. Pt has a f/u with pcp on 2/9. PCP is aware of current reading.

## 2020-02-08 NOTE — Telephone Encounter (Signed)
-----   Message from [name] [name], MD sent at 02/08/2020 10:52 AM EST ----- Regarding: FW: notification of high glucose Can you please check on pt and see if she has checked BS today. She is supposed to be following with endocrinologist. Thanks, BJ ----- Message ----- From: Clydell Hakim, RD Sent: 02/07/2020   1:51 PM EST To: [name] [name], MD Subject: notification of high glucose                   Dr. [name], I am seeing Ms. Stiver in the office now.  Her glucose in the office is 533.  She has no nausea.  She did not take the lantus  today (usually 60 units q am per patient) and did not take her Humalog with breakfast (donut, OJ, 1/2 cup coffee). I addressed these issues but it is part of our protocol to inform you of patients with extremely elevated blood sugar.  Full patient note communication to follow.  Please let me know of anything that you want me to call patient about.  Antonieta Iba, RD, LDN, CDCES

## 2020-02-12 ENCOUNTER — Ambulatory Visit: Payer: Medicare Other | Admitting: Internal Medicine

## 2020-02-12 ENCOUNTER — Other Ambulatory Visit: Payer: Self-pay

## 2020-02-12 ENCOUNTER — Encounter: Payer: Self-pay | Admitting: Internal Medicine

## 2020-02-12 VITALS — BP 144/60 | HR 81 | Ht 60.0 in | Wt 140.6 lb

## 2020-02-12 DIAGNOSIS — I10 Essential (primary) hypertension: Secondary | ICD-10-CM

## 2020-02-12 DIAGNOSIS — I700 Atherosclerosis of aorta: Secondary | ICD-10-CM

## 2020-02-12 DIAGNOSIS — R072 Precordial pain: Secondary | ICD-10-CM

## 2020-02-12 DIAGNOSIS — R0600 Dyspnea, unspecified: Secondary | ICD-10-CM

## 2020-02-12 DIAGNOSIS — E119 Type 2 diabetes mellitus without complications: Secondary | ICD-10-CM | POA: Diagnosis not present

## 2020-02-12 DIAGNOSIS — R0609 Other forms of dyspnea: Secondary | ICD-10-CM | POA: Insufficient documentation

## 2020-02-12 DIAGNOSIS — E782 Mixed hyperlipidemia: Secondary | ICD-10-CM

## 2020-02-12 LAB — PRO B NATRIURETIC PEPTIDE: NT-Pro BNP: 132 pg/mL (ref 0–301)

## 2020-02-12 MED ORDER — METOPROLOL TARTRATE 100 MG PO TABS: ORAL_TABLET | ORAL | 0 refills | Status: DC

## 2020-02-12 NOTE — Patient Instructions (Addendum)
Medication Instructions:  Your physician recommends that you continue on your current medications as directed. Please refer to the Current Medication list given to you today.  *If you need a refill on your cardiac medications before your next appointment, please call your pharmacy*   Lab Work: TODAY: BNP 3MONTHS: FLP, LFT You will need labs prior to your Cardiac CT If you have labs (blood work) drawn today and your tests are completely normal, you will receive your results only by: Marland Kitchen MyChart Message (if you have MyChart) OR . A paper copy in the mail If you have any lab test that is abnormal or we need to change your treatment, we will call you to review the results.   Testing/Procedures: Your physician has requested that you have cardiac CT. Cardiac computed tomography (CT) is a painless test that uses an x-ray machine to take clear, detailed pictures of your heart.        Follow-Up: At [facility], you and your health needs are our priority.  As part of our continuing mission to provide you with exceptional heart care, we have created designated Provider Care Teams.  These Care Teams include your primary Cardiologist (physician) and Advanced Practice Providers (APPs -  Physician Assistants and Nurse Practitioners) who all work together to provide you with the care you need, when you need it.   Your next appointment:   3 month(s)  The format for your next appointment:   In Person  Provider:   You may see Gasper Sells, MD or one of the following Advanced Practice Providers on your designated Care Team:    Melina Copa, PA-C  Ermalinda Barrios, PA-C    Other Instructions  Your cardiac CT will be scheduled at one of the below location   [facility] [address] [place], [place] [address] [phone]  [place] [address] [place], [place] [address] [phone]  If scheduled at [facility], please arrive at the [facility] main entrance of [facility] 30 minutes prior to test start time. Proceed to the [facility] Radiology Department (first floor) to check-in and test prep.  If scheduled at [facility], please arrive 15 mins early for check-in and test prep.  Please follow these instructions carefully (unless otherwise directed):  On the Night Before the Test: . Be sure to Drink plenty of water. . Do not consume any caffeinated/decaffeinated beverages or chocolate 12 hours prior to your test. . Do not take any antihistamines 12 hours prior to your test.  On the Day of the Test: . Drink plenty of water. Do not drink any water within one hour of the test. . Do not eat any food 4 hours prior to the test. . You may take your regular medications prior to the test.  . Take metoprolol (Lopressor) 184m two hours prior to test. . HOLD Furosemide, Victoza, Insulin morning of the test. . FEMALES- please wear underwire-free bra if available        After the Test: . Drink plenty of water. . After receiving IV contrast, you may experience a mild flushed feeling. This is normal. . On occasion, you may experience a mild rash up to 24 hours after the test. This is not dangerous. If this occurs, you can take Benadryl 25 mg and increase your fluid intake. . If you experience trouble breathing, this can be serious. If it is severe call 911 IMMEDIATELY. If  it is mild, please call our office.  Once we have confirmed authorization from your insurance company, we will call you to set up a date and time for your test. Based on how quickly your insurance processes prior authorizations requests, please allow up to 4 weeks to be contacted for scheduling your Cardiac CT appointment. Be advised that routine Cardiac CT appointments could be scheduled as many as 8 weeks after your provider has ordered it.  For non-scheduling related questions, please contact  the cardiac imaging nurse navigator should you have any questions/concerns: Marchia Bond, Cardiac Imaging Nurse Navigator Burley Saver, Interim Cardiac Imaging Nurse [place] [facility] Direct Office Dial: [phone]   For scheduling needs, including cancellations and rescheduling, please call [place], [phone].

## 2020-02-12 NOTE — Progress Notes (Signed)
Cardiology Office Note:    Date:  02/12/2020   ID:  Sarah [name], DOB 1945-12-25, MRN [id number]  PCP:  [name], [name], MD  [place] Cardiologist:  No primary care provider on file.  [facility] Electrophysiologist:  None   CC: New DOE Consulted for the evaluation of DOE at the behest of [name], Malka So, MD  History of Present Illness:    Sarah [name] is a 75 y.o. female with a hx of DM with HTN, Prior Stroke in 2018, Aortic atherosclerosis who presents for evaluation 02/12/20.  Patient notes that she is feeling DOE going up stairs.  Has had no chest pain, chest pressure, chest tightness, chest stinging or heaviness.  Does note shoulder heaviness and aching.  Discomfort occurs during the day, worsens with going up the stairs, and improves with time (doesn't change with rest).  Patient exertion notable for taking care of a 75 year old with and feels no symptoms.  No shortness of breath at rest.  No PND or orthopnea.  No bendopnea, no weight gain, notes ankle and feet  swelling with some improvement on furosemide.  No syncope or near syncope. Notes  occasional palpitations or funny heart beats that occur seldom.     Patient reports prior cardiac testing including 02/09/2018 echo.  No history of pre-eclampsia, early menarche, or prematurity.  No Fen-Phen or drug use.  Ambulatory SBP 120-140.   Past Medical History:  Diagnosis Date  . Bilateral carpal tunnel syndrome 03/27/2019  . Boils   . Glaucoma   . Heart murmur   . HTN (hypertension)   . Hx of adenomatous colonic polyps   . Hyperlipidemia   . Osteoporosis   . Pneumonia   . Stroke ([place])   . Type II or unspecified type diabetes mellitus without mention of complication, uncontrolled     Past Surgical History:  Procedure Laterality Date  . ABDOMINAL HYSTERECTOMY    . CARPAL TUNNEL RELEASE     right  . COLONOSCOPY  12-10-10   per Dr. Deatra Ina, clear, repeat in 7 yrs   . INCISION AND DRAINAGE PERIRECTAL  ABSCESS N/A 09/26/2015   Procedure: IRRIGATION AND DEBRIDEMENT PERIRECTAL ABSCESS;  Surgeon: Mickeal Skinner, MD;  Location: [place];  Service: General;  Laterality: N/A;  . KNEE ARTHROSCOPY     right  . LOOP RECORDER INSERTION N/A 10/19/2016   Procedure: LOOP RECORDER INSERTION;  Surgeon: Thompson Grayer, MD;  Location: [place] CV LAB;  Service: Cardiovascular;  Laterality: N/A;  . ORIF ANKLE FRACTURE Right 03/24/2012   Procedure: OPEN REDUCTION INTERNAL FIXATION (ORIF) ANKLE FRACTURE;  Surgeon: Newt Minion, MD;  Location: [place];  Service: Orthopedics;  Laterality: Right;  Open Reduction Internal Fixation Right Bimalleolar ankle fracture  . POLYPECTOMY    . TEE WITHOUT CARDIOVERSION N/A 10/18/2016   Procedure: TRANSESOPHAGEAL ECHOCARDIOGRAM (TEE);  Surgeon: Fay Records, MD;  Location: [facility] ENDOSCOPY;  Service: Cardiovascular;  Laterality: N/A;  . TONSILLECTOMY      Current Medications: Current Meds  Medication Sig  . acetaminophen (TYLENOL) 500 MG tablet Take 1,000 mg by mouth every 6 (six) hours as needed for mild pain or headache.  Marland Kitchen amLODipine (NORVASC) 10 MG tablet Take 10 mg by mouth daily.  Marland Kitchen atorvastatin (LIPITOR) 80 MG tablet Take 1 tablet (80 mg total) by mouth daily at 6 PM.  . Blood Glucose Calibration (OT ULTRA/FASTTK CNTRL SOLN) SOLN   . calcium carbonate (TUMS EX) 750 MG chewable tablet Chew 1  tablet by mouth as needed for heartburn.  . clopidogrel (PLAVIX) 75 MG tablet TAKE 1 TABLET BY MOUTH DAILY  . diclofenac Sodium (VOLTAREN) 1 % GEL Apply 2 g topically 4 (four) times daily.  Marland Kitchen ezetimibe (ZETIA) 10 MG tablet Take 1 tablet (10 mg total) by mouth daily.  . famotidine (PEPCID) 40 MG tablet Take 40 mg by mouth daily as needed for heartburn or indigestion.  . furosemide (LASIX) 20 MG tablet Take 1 tablet (20 mg total) by mouth daily.  . insulin glargine (LANTUS SOLOSTAR) 100 UNIT/ML Solostar Pen Inject 60 Units into the skin daily.  . insulin lispro (HUMALOG KWIKPEN) 200  UNIT/ML KwikPen Inject 15 Units into the skin with breakfast, with lunch, and with evening meal. 15 U 15 min before meals  . Insulin Pen Needle (BD PEN NEEDLE NANO U/F) 32G X 4 MM MISC USE UP TO FOUR TIMES DAILY  . Lancet Devices (SIMPLE DIAGNOSTICS LANCING DEV) MISC Check BS before and 2 hours after meals.  Marland Kitchen losartan (COZAAR) 50 MG tablet TAKE 1 TABLET(50 MG) BY MOUTH DAILY  . metoprolol tartrate (LOPRESSOR) 100 MG tablet Take 1 tablet 2 hours prior to Cardiac CT  . omeprazole (PRILOSEC) 20 MG capsule Take 1 capsule (20 mg total) by mouth daily.  . ONE TOUCH ULTRA TEST test strip Check BS before and 2 hours after meals.  Marland Kitchen PHARMACIST CHOICE ALCOHOL 70 % PADS   . ROCKLATAN 0.02-0.005 % SOLN Place 1 drop into both eyes every evening.  . sertraline (ZOLOFT) 25 MG tablet Take 1 tablet (25 mg total) by mouth daily.  Marland Kitchen VICTOZA 18 MG/3ML SOPN ADMINISTER 1.8 MG UNDER THE SKIN DAILY  . Vitamin D, Ergocalciferol, (DRISDOL) 1.25 MG (50000 UNIT) CAPS capsule TAKE ONE CAPSULE BY MOUTH TWICE A WEEK( MONDAY AND THURSDAY)     Allergies:   Patient has no known allergies.   Social History   Socioeconomic History  . Marital status: Single    Spouse name: Not on file  . Number of children: Not on file  . Years of education: Not on file  . Highest education level: Not on file  Occupational History  . Not on file  Tobacco Use  . Smoking status: Former Smoker    Types: Cigarettes    Quit date: 10/15/2016    Years since quitting: 3.3  . Smokeless tobacco: Never Used  . Tobacco comment: smokes occ.   Vaping Use  . Vaping Use: Never used  Substance and Sexual Activity  . Alcohol use: Yes    Alcohol/week: 0.0 standard drinks    Comment: occ  . Drug use: No  . Sexual activity: Not on file  Other Topics Concern  . Not on file  Social History Narrative  . Not on file   Social Determinants of Health   Financial Resource Strain: Not on file  Food Insecurity: No Food Insecurity  . Worried About  Charity fundraiser in the Last Year: Never true  . Ran Out of Food in the Last Year: Never true  Transportation Needs: Not on file  Physical Activity: Not on file  Stress: Stress Concern Present  . Feeling of Stress : To some extent  Social Connections: Not on file     Family History: The patient's family history includes Diabetes in her mother; Hypertension in her mother; Stroke in her father, maternal uncle, and paternal uncle. There is no history of Colon cancer, Esophageal cancer, Rectal cancer, or Stomach cancer. History of coronary  artery disease notable for no members. History of heart failure notable for father. History of arrhythmia notable for no members.  ROS:   Please see the history of present illness.     All other systems reviewed and are negative.  EKGs/Labs/Other Studies Reviewed:    The following studies were reviewed today:  EKG:   02/12/2020: SR rate 81 WNL  LINQ monitor: Date: 01/31/20 Results: No AF in many reports reviewed  Transthoracic Echocardiogram: Date: 02/09/2018 Results: 1. The left ventricle has normal systolic function of 28-41%. The cavity  size is normal. There is no left ventricular wall thickness. Echo evidence  of impaired relaxation diastolic filling patterns. Indeterminent filling  pressures.  2. Normal left atrial size.  3. Normal right atrial size.  4. Normal tricuspid valve.  5. The aortic valve normal. There is mild thickening and mild  calcification of the aortic valve.  6. No atrial level shunt detected by color flow Doppler.   Transesophageal Echocardiogram: Date: 10/19/2015 Results: Mild fixed plaquing of the thoracic aorta no thrombus  NonCardiac CT : Date: 09/26/2011 Results: Aortic Atherosclerosis no coronary artery calcification  Recent Labs: 08/01/2019: ALT 9; Hemoglobin 12.2; Platelets 256; TSH 1.67 10/24/2019: Brain Natriuretic Peptide 72 01/21/2020: BUN 13; Creatinine, Ser 0.72; Magnesium 1.9; Potassium 4.2;  Sodium 135  Recent Lipid Panel    Component Value Date/Time   CHOL 203 (H) 04/27/2019 1100   TRIG 114.0 04/27/2019 1100   HDL 65.10 04/27/2019 1100   CHOLHDL 3 04/27/2019 1100   VLDL 22.8 04/27/2019 1100   LDLCALC 115 (H) 04/27/2019 1100    Risk Assessment/Calculations:     N/A  Physical Exam:    VS:  BP (!) 144/60   Pulse 81   Ht 5' (1.524 m)   Wt 140 lb 9.6 oz (63.8 kg)   BMI 27.46 kg/m     Wt Readings from Last 3 Encounters:  02/12/20 140 lb 9.6 oz (63.8 kg)  02/07/20 133 lb (60.3 kg)  01/21/20 134 lb (60.8 kg)     GEN:  Well nourished, well developed in no acute distress HEENT: Normal NECK: No JVD; No carotid bruits LYMPHATICS: No lymphadenopathy CARDIAC: RRR, no murmurs, rubs, gallops RESPIRATORY:  Clear to auscultation without rales, wheezing or rhonchi  ABDOMEN: Soft, non-tender, non-distended MUSCULOSKELETAL:  +1 edema bilaterally ; No deformity  SKIN: Warm and dry NEUROLOGIC:  Alert and oriented x 3 PSYCHIATRIC:  Normal affect   ASSESSMENT:    1. DOE (dyspnea on exertion)   2. Precordial chest pain   3. Diabetes mellitus with coincident hypertension ([place])   4. Aortic atherosclerosis ([place])   5. Mixed hyperlipidemia   6. Precordial pain    PLAN:    In order of problems listed above:  DOE concerning for angina equivalent Precordial CP - The patient presents with possibly cardiac pain - has other vascular disease (prior stroke) - Would recommend CCTA with possible FFR as needed to exclude obstructive CAD and to assess for non-obstructive CAD requiring secondary prevention - will send BNP  Diabetes with hypertension - ambulatory blood pressure SBP 130, will continue ambulatory BP monitoring; gave education on how to perform ambulatory blood pressure monitoring including the frequency and technique; goal ambulatory blood pressure < 135/85 on average - continue home medications  - discussed diet (DASH/low sodium), and exercise/weight loss  interventions - will readdress leg swelling and lasix dosing at next visit.  Hyperlipidemia (mixed) Aortic Atherosclerosis -LDL goal less than 70 - will consider lipid clinic referral  at next visi -continue current statin and Zetia - will recheck in 3 months fasting - gave education on dietary changes  Three months follow up unless new symptoms or abnormal test results warranting change in plan  Would be reasonable for  APP Follow up   Medication Adjustments/Labs and Tests Ordered: Current medicines are reviewed at length with the patient today.  Concerns regarding medicines are outlined above.  Orders Placed This Encounter  Procedures  . CT CORONARY MORPH W/CTA COR W/SCORE W/CA W/CM &/OR WO/CM  . CT CORONARY FRACTIONAL FLOW RESERVE DATA PREP  . CT CORONARY FRACTIONAL FLOW RESERVE FLUID ANALYSIS  . Pro b natriuretic peptide (BNP)  . Lipid panel  . Hepatic function panel  . Basic metabolic panel  . EKG 12-Lead   Meds ordered this encounter  Medications  . metoprolol tartrate (LOPRESSOR) 100 MG tablet    Sig: Take 1 tablet 2 hours prior to Cardiac CT    Dispense:  1 tablet    Refill:  0    Patient Instructions  Medication Instructions:  Your physician recommends that you continue on your current medications as directed. Please refer to the Current Medication list given to you today.  *If you need a refill on your cardiac medications before your next appointment, please call your pharmacy*   Lab Work: TODAY: BNP 3MONTHS: FLP, LFT You will need labs prior to your Cardiac CT If you have labs (blood work) drawn today and your tests are completely normal, you will receive your results only by: Marland Kitchen MyChart Message (if you have MyChart) OR . A paper copy in the mail If you have any lab test that is abnormal or we need to change your treatment, we will call you to review the results.   Testing/Procedures: Your physician has requested that you have cardiac CT. Cardiac  computed tomography (CT) is a painless test that uses an x-ray machine to take clear, detailed pictures of your heart.        Follow-Up: At [facility], you and your health needs are our priority.  As part of our continuing mission to provide you with exceptional heart care, we have created designated Provider Care Teams.  These Care Teams include your primary Cardiologist (physician) and Advanced Practice Providers (APPs -  Physician Assistants and Nurse Practitioners) who all work together to provide you with the care you need, when you need it.   Your next appointment:   3 month(s)  The format for your next appointment:   In Person  Provider:   You may see Gasper Sells, MD or one of the following Advanced Practice Providers on your designated Care Team:    Melina Copa, PA-C  Ermalinda Barrios, PA-C    Other Instructions  Your cardiac CT will be scheduled at one of the below location   [facility] [address] [place], [place] [address] [phone]  [place] [address] [place], [place] [address] [phone]  If scheduled at [facility], please arrive at the [facility] main entrance of [facility] 30 minutes prior to test start time. Proceed to the [facility] Radiology Department (first floor) to check-in and test prep.  If scheduled at [facility], please arrive 15 mins early for check-in and test prep.  Please follow these instructions carefully (unless otherwise directed):  On the Night Before the Test: . Be sure to Drink plenty of water. . Do not  consume any caffeinated/decaffeinated beverages or chocolate 12 hours prior to your test. . Do not take any antihistamines 12 hours prior to your test.  On the Day of the Test: . Drink plenty of water. Do not drink any water within one hour of the test. . Do not eat any food 4 hours prior to the  test. . You may take your regular medications prior to the test.  . Take metoprolol (Lopressor) 196m two hours prior to test. . HOLD Furosemide, Victoza, Insulin morning of the test. . FEMALES- please wear underwire-free bra if available        After the Test: . Drink plenty of water. . After receiving IV contrast, you may experience a mild flushed feeling. This is normal. . On occasion, you may experience a mild rash up to 24 hours after the test. This is not dangerous. If this occurs, you can take Benadryl 25 mg and increase your fluid intake. . If you experience trouble breathing, this can be serious. If it is severe call 911 IMMEDIATELY. If it is mild, please call our office.  Once we have confirmed authorization from your insurance company, we will call you to set up a date and time for your test. Based on how quickly your insurance processes prior authorizations requests, please allow up to 4 weeks to be contacted for scheduling your Cardiac CT appointment. Be advised that routine Cardiac CT appointments could be scheduled as many as 8 weeks after your provider has ordered it.  For non-scheduling related questions, please contact the cardiac imaging nurse navigator should you have any questions/concerns: SMarchia Bond Cardiac Imaging Nurse Navigator MBurley Saver Interim Cardiac Imaging Nurse [place] [facility] Direct Office Dial: [phone]  For scheduling needs, including cancellations and rescheduling, please call [place] [phone]       Signed, MWerner Lean MD  02/12/2020 12:00 PM    [facility]\

## 2020-02-13 NOTE — Progress Notes (Signed)
[facility] Summary Report / Loop Recorder 

## 2020-02-20 ENCOUNTER — Other Ambulatory Visit: Payer: Self-pay | Admitting: Family Medicine

## 2020-02-20 ENCOUNTER — Ambulatory Visit: Payer: Medicare Other | Admitting: Family Medicine

## 2020-02-20 DIAGNOSIS — R600 Localized edema: Secondary | ICD-10-CM

## 2020-02-25 ENCOUNTER — Ambulatory Visit (INDEPENDENT_AMBULATORY_CARE_PROVIDER_SITE_OTHER): Payer: Medicare Other | Admitting: Family Medicine

## 2020-02-25 ENCOUNTER — Other Ambulatory Visit: Payer: Self-pay

## 2020-02-25 ENCOUNTER — Other Ambulatory Visit: Payer: Medicare Other | Admitting: *Deleted

## 2020-02-25 ENCOUNTER — Encounter: Payer: Self-pay | Admitting: Family Medicine

## 2020-02-25 VITALS — BP 138/70 | HR 82 | Resp 16 | Ht 60.0 in | Wt 138.0 lb

## 2020-02-25 DIAGNOSIS — E1121 Type 2 diabetes mellitus with diabetic nephropathy: Secondary | ICD-10-CM | POA: Diagnosis not present

## 2020-02-25 DIAGNOSIS — E559 Vitamin D deficiency, unspecified: Secondary | ICD-10-CM

## 2020-02-25 DIAGNOSIS — I119 Hypertensive heart disease without heart failure: Secondary | ICD-10-CM | POA: Diagnosis not present

## 2020-02-25 DIAGNOSIS — R072 Precordial pain: Secondary | ICD-10-CM

## 2020-02-25 DIAGNOSIS — I10 Essential (primary) hypertension: Secondary | ICD-10-CM | POA: Diagnosis not present

## 2020-02-25 DIAGNOSIS — I700 Atherosclerosis of aorta: Secondary | ICD-10-CM | POA: Diagnosis not present

## 2020-02-25 DIAGNOSIS — R0609 Other forms of dyspnea: Secondary | ICD-10-CM

## 2020-02-25 DIAGNOSIS — E119 Type 2 diabetes mellitus without complications: Secondary | ICD-10-CM | POA: Diagnosis not present

## 2020-02-25 DIAGNOSIS — R0600 Dyspnea, unspecified: Secondary | ICD-10-CM

## 2020-02-25 DIAGNOSIS — E782 Mixed hyperlipidemia: Secondary | ICD-10-CM

## 2020-02-25 DIAGNOSIS — R600 Localized edema: Secondary | ICD-10-CM | POA: Diagnosis not present

## 2020-02-25 LAB — BASIC METABOLIC PANEL WITH GFR
BUN/Creatinine Ratio: 28 (ref 12–28)
BUN: 15 mg/dL (ref 8–27)
CO2: 23 mmol/L (ref 20–29)
Calcium: 9.6 mg/dL (ref 8.7–10.3)
Chloride: 100 mmol/L (ref 96–106)
Creatinine, Ser: 0.54 mg/dL — ABNORMAL LOW (ref 0.57–1.00)
GFR calc Af Amer: 109 mL/min/1.73
GFR calc non Af Amer: 95 mL/min/1.73
Glucose: 348 mg/dL — ABNORMAL HIGH (ref 65–99)
Potassium: 4.1 mmol/L (ref 3.5–5.2)
Sodium: 137 mmol/L (ref 134–144)

## 2020-02-25 MED ORDER — HUMALOG KWIKPEN 200 UNIT/ML ~~LOC~~ SOPN: 15.0000 [IU] | PEN_INJECTOR | Freq: Three times a day (TID) | SUBCUTANEOUS | 0 refills | Status: DC

## 2020-02-25 MED ORDER — VICTOZA 18 MG/3ML ~~LOC~~ SOPN
PEN_INJECTOR | SUBCUTANEOUS | 0 refills | Status: DC
Start: 2020-02-25 — End: 2020-03-03

## 2020-02-25 NOTE — Progress Notes (Signed)
HPI: Sarah [name] is a 75 y.o. female, who is here today for 6 months follow up.   She was last seen on 01/21/2020 for acute visit.  She is not checking her BP. Negative severe/frequent headache, visual changes, chest pain, dyspnea, palpitation, claudication, new focal weakness, or edema.  Lab Results  Component Value Date   CREATININE 0.72 01/21/2020   BUN 13 01/21/2020   NA 135 01/21/2020   K 4.2 01/21/2020   CL 101 01/21/2020   CO2 28 01/21/2020   Vit D deficiency: She has not taken Ergocalciferol 50,000 U in 3 weeks. Last 25 OH vit D was 39, 06/2018.  Lower extremities edema, worse at the end of the day. Improved with Furosemide 20 mg daily. She has not noted erythema and there is no pain.  BS's 200's. She taking medication. She met with diabetic education. She has not followed with Sarah [name].  Lab Results  Component Value Date   HGBA1C 12.5 (H) 08/01/2019   She drinks fruit juice,a snaples drink once daily.  Review of Systems  Constitutional: Positive for fatigue. Negative for activity change, appetite change and fever.  HENT: Negative for mouth sores, nosebleeds, sore throat and trouble swallowing.   Respiratory: Negative for cough and wheezing.   Gastrointestinal: Negative for abdominal pain, nausea and vomiting.       Negative for changes in bowel habits.  Genitourinary: Negative for decreased urine volume, dysuria and hematuria.  Neurological: Negative for syncope and facial asymmetry.  Rest of ROS, see pertinent positives sand negatives in HPI  Current Outpatient Medications on File Prior to Visit  Medication Sig Dispense Refill  . acetaminophen (TYLENOL) 500 MG tablet Take 1,000 mg by mouth every 6 (six) hours as needed for mild pain or headache.    Marland Kitchen amLODipine (NORVASC) 10 MG tablet Take 10 mg by mouth daily.    Marland Kitchen atorvastatin (LIPITOR) 80 MG tablet Take 1 tablet (80 mg total) by mouth daily at 6 PM. 90 tablet 2  . Blood Glucose  Calibration (OT ULTRA/FASTTK CNTRL SOLN) SOLN     . calcium carbonate (TUMS EX) 750 MG chewable tablet Chew 1 tablet by mouth as needed for heartburn.    . clopidogrel (PLAVIX) 75 MG tablet TAKE 1 TABLET BY MOUTH DAILY 90 tablet 1  . diclofenac Sodium (VOLTAREN) 1 % GEL Apply 2 g topically 4 (four) times daily. 150 g 3  . ezetimibe (ZETIA) 10 MG tablet Take 1 tablet (10 mg total) by mouth daily. 90 tablet 3  . famotidine (PEPCID) 40 MG tablet Take 40 mg by mouth daily as needed for heartburn or indigestion.    . furosemide (LASIX) 20 MG tablet TAKE 1 TABLET(20 MG) BY MOUTH DAILY 30 tablet 2  . insulin glargine (LANTUS SOLOSTAR) 100 UNIT/ML Solostar Pen Inject 60 Units into the skin daily. 30 mL 3  . Insulin Pen Needle (BD PEN NEEDLE NANO U/F) 32G X 4 MM MISC USE UP TO FOUR TIMES DAILY 200 each 2  . Lancet Devices (SIMPLE DIAGNOSTICS LANCING DEV) MISC Check BS before and 2 hours after meals. 200 each 6  . losartan (COZAAR) 50 MG tablet TAKE 1 TABLET(50 MG) BY MOUTH DAILY 90 tablet 1  . metoprolol tartrate (LOPRESSOR) 100 MG tablet Take 1 tablet 2 hours prior to Cardiac CT 1 tablet 0  . omeprazole (PRILOSEC) 20 MG capsule Take 1 capsule (20 mg total) by mouth daily. 30 capsule 3  . ONE TOUCH ULTRA TEST test strip  Check BS before and 2 hours after meals. 200 each 6  . PHARMACIST CHOICE ALCOHOL 70 % PADS     . ROCKLATAN 0.02-0.005 % SOLN Place 1 drop into both eyes every evening.    . sertraline (ZOLOFT) 25 MG tablet Take 1 tablet (25 mg total) by mouth daily. 30 tablet 2  . Vitamin D, Ergocalciferol, (DRISDOL) 1.25 MG (50000 UNIT) CAPS capsule TAKE ONE CAPSULE BY MOUTH TWICE A WEEK( MONDAY AND THURSDAY) 8 capsule 0   No current facility-administered medications on file prior to visit.     Past Medical History:  Diagnosis Date  . Bilateral carpal tunnel syndrome 03/27/2019  . Boils   . Glaucoma   . Heart murmur   . HTN (hypertension)   . Hx of adenomatous colonic polyps   . Hyperlipidemia    . Osteoporosis   . Pneumonia   . Stroke ([place])   . Type II or unspecified type diabetes mellitus without mention of complication, uncontrolled    No Known Allergies  Social History   Socioeconomic History  . Marital status: Single    Spouse name: Not on file  . Number of children: Not on file  . Years of education: Not on file  . Highest education level: Not on file  Occupational History  . Not on file  Tobacco Use  . Smoking status: Former Smoker    Types: Cigarettes    Quit date: 10/15/2016    Years since quitting: 3.3  . Smokeless tobacco: Never Used  . Tobacco comment: smokes occ.   Vaping Use  . Vaping Use: Never used  Substance and Sexual Activity  . Alcohol use: Yes    Alcohol/week: 0.0 standard drinks    Comment: occ  . Drug use: No  . Sexual activity: Not on file  Other Topics Concern  . Not on file  Social History Narrative  . Not on file   Social Determinants of Health   Financial Resource Strain: Not on file  Food Insecurity: No Food Insecurity  . Worried About Charity fundraiser in the Last Year: Never true  . Ran Out of Food in the Last Year: Never true  Transportation Needs: Not on file  Physical Activity: Not on file  Stress: Stress Concern Present  . Feeling of Stress : To some extent  Social Connections: Not on file   Vitals:   02/25/20 0942  BP: 138/70  Pulse: 82  Resp: 16  SpO2: 98%   Body mass index is 26.95 kg/m.  Physical Exam Vitals and nursing note reviewed.  Constitutional:      General: She is not in acute distress.    Appearance: She is well-developed.  HENT:     Head: Normocephalic and atraumatic.     Mouth/Throat:     Mouth: Oropharynx is clear and moist and mucous membranes are normal. Mucous membranes are moist.     Dentition: Abnormal dentition. Dental caries present.     Pharynx: Uvula midline.  Eyes:     Conjunctiva/sclera: Conjunctivae normal.  Cardiovascular:     Rate and Rhythm: Normal rate and regular  rhythm.     Pulses:          Dorsalis pedis pulses are 2+ on the right side and 2+ on the left side.     Heart sounds: Murmur (SEM I?VI RUSB and LUSB) heard.    Pulmonary:     Effort: Pulmonary effort is normal. No respiratory distress.     Breath  sounds: Normal breath sounds.  Abdominal:     Palpations: Abdomen is soft. There is no hepatomegaly or mass.     Tenderness: There is no abdominal tenderness.  Musculoskeletal:     Right lower leg: 2+ Edema present.     Left lower leg: 2+ Edema present.  Lymphadenopathy:     Cervical: No cervical adenopathy.  Skin:    General: Skin is warm.     Findings: No erythema or rash.  Neurological:     Mental Status: She is alert and oriented to person, place, and time.     Cranial Nerves: No cranial nerve deficit.     Gait: Gait normal.     Deep Tendon Reflexes: Strength normal.  Psychiatric:        Mood and Affect: Mood and affect normal.     Comments: Well groomed, good eye contact.   ASSESSMENT AND PLAN:   Sarah [name] was seen today for 6 months follow-up.  No orders of the defined types were placed in this encounter.  Type II diabetes mellitus with nephropathy ([place]) Problem is not well controlled. She does not want A1C done today. We discussed possible complications of poorly controlled glucose. Strongly recommend arranging appt today. Appropriate foot,dental,and eye care discussed.  Hypertension with heart disease BP adequately controlled. Continue Losartan and Metoprolol. Recommend monitoring BP at home, technique discussed. Low salt diet.  Vitamin D deficiency Start Vit D 2000 U daily. We can plan on checking 25 OH vit D next visit.  Bilateral lower extremity edema Stable. Continue Furosemide 20 mg daily. LE elevation and compression stockings recommended.   Return in about 5 months (around 07/24/2020) for HTN.   [name]. [name], MD  [facility]. [place] office.    A few things to  remember from today's visit:   Type II diabetes mellitus with nephropathy ([place]) - Plan: liraglutide ([place]) 18 MG/3ML SOPN  If you need refills please call your pharmacy. Do not use My Chart to request refills or for acute issues that need immediate attention.   Start Vit 2000 U daily.  Please arrange appointment with your endocrinologist. No changes in rest. Legs elevation and compression stocking to help with swelling.  Please be sure medication list is accurate. If a new problem present, please set up appointment sooner than planned today.

## 2020-02-25 NOTE — Assessment & Plan Note (Signed)
BP adequately controlled. Continue Losartan and Metoprolol. Recommend monitoring BP at home, technique discussed. Low salt diet.

## 2020-02-25 NOTE — Assessment & Plan Note (Signed)
Stable. Continue Furosemide 20 mg daily. LE elevation and compression stockings recommended.

## 2020-02-25 NOTE — Assessment & Plan Note (Signed)
Start Vit D 2000 U daily. We can plan on checking 25 OH vit D next visit.

## 2020-02-25 NOTE — Assessment & Plan Note (Signed)
Problem is not well controlled. She does not want A1C done today. We discussed possible complications of poorly controlled glucose. Strongly recommend arranging appt today. Appropriate foot,dental,and eye care discussed.

## 2020-02-25 NOTE — Patient Instructions (Addendum)
A few things to remember from today's visit:   Type II diabetes mellitus with nephropathy ([place]) - Plan: liraglutide ([place]) 18 MG/3ML SOPN  If you need refills please call your pharmacy. Do not use My Chart to request refills or for acute issues that need immediate attention.   Start Vit 2000 U daily.  Please arrange appointment with your endocrinologist. No changes in rest. Legs elevation and compression stocking to help with swelling.  Please be sure medication list is accurate. If a new problem present, please set up appointment sooner than planned today.

## 2020-02-26 ENCOUNTER — Telehealth (HOSPITAL_COMMUNITY): Payer: Self-pay | Admitting: *Deleted

## 2020-02-26 ENCOUNTER — Telehealth: Payer: Self-pay | Admitting: Family Medicine

## 2020-02-26 DIAGNOSIS — E1142 Type 2 diabetes mellitus with diabetic polyneuropathy: Secondary | ICD-10-CM

## 2020-02-26 DIAGNOSIS — Z794 Long term (current) use of insulin: Secondary | ICD-10-CM

## 2020-02-26 NOTE — Telephone Encounter (Signed)
Pt calling regarding upcoming cardiac imaging study; pt verbalizes understanding of appt date/time, parking situation and where to check in, pre-test NPO status and medications ordered, and verified current allergies; name and call back number provided for further questions should they arise  Gordy Clement RN Navigator Cardiac [facility] [place] [facility] [phone] office [phone] cell

## 2020-02-26 NOTE — Telephone Encounter (Signed)
Referral entered  

## 2020-02-26 NOTE — Telephone Encounter (Signed)
Pt was seen on 02/25/2020 and Dr. [name] wanted patient to call Endo department to schedule appointment. The patient called [facility] but needs a referral before scheduling appointment.

## 2020-02-27 ENCOUNTER — Telehealth (HOSPITAL_COMMUNITY): Payer: Self-pay | Admitting: Emergency Medicine

## 2020-02-27 ENCOUNTER — Other Ambulatory Visit: Payer: Self-pay

## 2020-02-27 NOTE — Telephone Encounter (Signed)
Pt reports she misplaced her instructions from [place] yesterday. Reviewed CCTA instructions again in their entirety. Pt appreciated the information.  Marchia Bond RN Navigator Cardiac Imaging [facility] [phone] Office  [phone] Cell

## 2020-02-28 ENCOUNTER — Ambulatory Visit (HOSPITAL_COMMUNITY)
Admission: RE | Admit: 2020-02-28 | Discharge: 2020-02-28 | Disposition: A | Discharge status: Home / Self Care | Payer: Medicare Other | Source: Ambulatory Visit | Attending: Internal Medicine | Admitting: Internal Medicine

## 2020-02-28 DIAGNOSIS — I700 Atherosclerosis of aorta: Secondary | ICD-10-CM | POA: Insufficient documentation

## 2020-02-28 DIAGNOSIS — R072 Precordial pain: Secondary | ICD-10-CM

## 2020-02-28 DIAGNOSIS — I2510 Atherosclerotic heart disease of native coronary artery without angina pectoris: Secondary | ICD-10-CM | POA: Diagnosis not present

## 2020-02-28 DIAGNOSIS — R931 Abnormal findings on diagnostic imaging of heart and coronary circulation: Secondary | ICD-10-CM | POA: Insufficient documentation

## 2020-02-28 MED ORDER — IOHEXOL 350 MG/ML SOLN
80.0000 mL | Freq: Once | INTRAVENOUS | Status: AC | PRN
  Administered 2020-02-28: 80 mL via INTRAVENOUS

## 2020-02-28 MED ORDER — NITROGLYCERIN 0.4 MG SL SUBL
0.8000 mg | SUBLINGUAL_TABLET | Freq: Once | SUBLINGUAL | Status: AC | PRN
  Administered 2020-02-28: 0.8 mg via SUBLINGUAL

## 2020-02-28 MED ORDER — NITROGLYCERIN 0.4 MG SL SUBL
SUBLINGUAL_TABLET | SUBLINGUAL | Status: AC
  Filled 2020-02-28: qty 2

## 2020-02-28 NOTE — Progress Notes (Signed)
Cardiac CT completed. PIV removed. Ambulatory to waiting room for discharge with family.

## 2020-02-28 NOTE — Progress Notes (Signed)
Patient arrived to Radiology for CT coronary, difficult IV start. Ultrasound guided IV attempted x2 by Mortimer Fries, CT Technologist.  Order for IV Team Consult placed for assistance with difficult IV access.

## 2020-02-28 NOTE — Progress Notes (Signed)
   02/28/20 1550  Vital Signs  Pulse Rate 72  Pulse Rate Source Monitor  BP 128/75  MAP (mmHg) 91  BP Location Right Arm  BP Method Automatic  Patient Position (if appropriate) Sitting  MEWS Score  MEWS Temp 0  MEWS Systolic 0  MEWS Pulse 0  MEWS RR 0  MEWS LOC 0  MEWS Score 0  MEWS Score Color Green    Returned from Cardiac CT, vitals signs obtained and light snack provided. Patient in no acute distress, no complaints of headache or lightheadedness.

## 2020-02-29 ENCOUNTER — Telehealth: Payer: Self-pay

## 2020-02-29 DIAGNOSIS — I2510 Atherosclerotic heart disease of native coronary artery without angina pectoris: Secondary | ICD-10-CM | POA: Diagnosis not present

## 2020-02-29 MED ORDER — NITROGLYCERIN 0.4 MG SL SUBL: 0.4000 mg | SUBLINGUAL_TABLET | SUBLINGUAL | 3 refills | Status: AC | PRN

## 2020-02-29 NOTE — Telephone Encounter (Signed)
The patient has been notified of the result and verbalized understanding.  All questions (if any) were answered. Michaelyn Barter, RN 02/29/2020 11:33 AM   Patient will come in next week for OV and will send Nitro to patient's Pharmacy.

## 2020-02-29 NOTE — Telephone Encounter (Signed)
-----   Message from Werner Lean, MD sent at 02/29/2020  8:50 AM EST ----- Results: RCA disease Plan: Add PRN Nitro Bring back for cath discussion  Werner Lean, MD

## 2020-03-03 ENCOUNTER — Other Ambulatory Visit: Payer: Self-pay

## 2020-03-03 ENCOUNTER — Ambulatory Visit (INDEPENDENT_AMBULATORY_CARE_PROVIDER_SITE_OTHER): Payer: Medicare Other

## 2020-03-03 ENCOUNTER — Ambulatory Visit (INDEPENDENT_AMBULATORY_CARE_PROVIDER_SITE_OTHER): Payer: Medicare Other | Admitting: Internal Medicine

## 2020-03-03 VITALS — BP 136/76 | HR 80 | Ht 60.0 in | Wt 135.2 lb

## 2020-03-03 DIAGNOSIS — E1165 Type 2 diabetes mellitus with hyperglycemia: Secondary | ICD-10-CM | POA: Diagnosis not present

## 2020-03-03 DIAGNOSIS — E119 Type 2 diabetes mellitus without complications: Secondary | ICD-10-CM | POA: Insufficient documentation

## 2020-03-03 DIAGNOSIS — E1159 Type 2 diabetes mellitus with other circulatory complications: Secondary | ICD-10-CM | POA: Diagnosis not present

## 2020-03-03 DIAGNOSIS — E1121 Type 2 diabetes mellitus with diabetic nephropathy: Secondary | ICD-10-CM | POA: Diagnosis not present

## 2020-03-03 DIAGNOSIS — I639 Cerebral infarction, unspecified: Secondary | ICD-10-CM

## 2020-03-03 DIAGNOSIS — Z794 Long term (current) use of insulin: Secondary | ICD-10-CM

## 2020-03-03 LAB — POCT GLUCOSE (DEVICE FOR HOME USE): POC Glucose: 322 mg/dl — AB (ref 70–99)

## 2020-03-03 LAB — CUP PACEART REMOTE DEVICE CHECK
Date Time Interrogation Session: 20220220224320
Implantable Pulse Generator Implant Date: 20181009

## 2020-03-03 LAB — POCT GLYCOSYLATED HEMOGLOBIN (HGB A1C): Hemoglobin A1C: 14.3 % — AB (ref 4.0–5.6)

## 2020-03-03 MED ORDER — VICTOZA 18 MG/3ML ~~LOC~~ SOPN: 1.8000 mg | PEN_INJECTOR | Freq: Every day | SUBCUTANEOUS | 3 refills | Status: DC

## 2020-03-03 MED ORDER — LANTUS SOLOSTAR 100 UNIT/ML ~~LOC~~ SOPN: 60.0000 [IU] | PEN_INJECTOR | Freq: Every day | SUBCUTANEOUS | 3 refills | Status: DC

## 2020-03-03 MED ORDER — HUMALOG KWIKPEN 200 UNIT/ML ~~LOC~~ SOPN: 18.0000 [IU] | PEN_INJECTOR | Freq: Three times a day (TID) | SUBCUTANEOUS | 3 refills | Status: DC

## 2020-03-03 MED ORDER — INSULIN PEN NEEDLE 32G X 4 MM MISC: 1.0000 | Freq: Four times a day (QID) | 3 refills | Status: DC

## 2020-03-03 MED ORDER — DEXCOM G6 TRANSMITTER MISC: 1.0000 | 3 refills | Status: DC

## 2020-03-03 MED ORDER — DEXCOM G6 SENSOR MISC: 1.0000 | 11 refills | Status: DC

## 2020-03-03 MED ORDER — DAPAGLIFLOZIN PROPANEDIOL 5 MG PO TABS: 5.0000 mg | ORAL_TABLET | Freq: Every day | ORAL | 6 refills | Status: DC

## 2020-03-03 NOTE — Patient Instructions (Signed)
-   Start Farxiga 5 mg, 1 tablet daily  - Continue Victoza 1.8 mg daily  - Continue  Lantus 60 units daily  - Increase Humalog to 18 units with each meal       HOW TO TREAT LOW BLOOD SUGARS (Blood sugar LESS THAN 70 MG/DL)  Please follow the RULE OF 15 for the treatment of hypoglycemia treatment (when your (blood sugars are less than 70 mg/dL)    STEP 1: Take 15 grams of carbohydrates when your blood sugar is low, which includes:   3-4 GLUCOSE TABS  OR  3-4 OZ OF JUICE OR REGULAR SODA OR  ONE TUBE OF GLUCOSE GEL     STEP 2: RECHECK blood sugar in 15 MINUTES STEP 3: If your blood sugar is still low at the 15 minute recheck --> then, go back to STEP 1 and treat AGAIN with another 15 grams of carbohydrates.

## 2020-03-03 NOTE — Progress Notes (Signed)
Name: Sarah [name]  Age/ Sex: 75 y.o., female   MRN/ DOB: [id number], 1945/01/27     PCP: [name], [name], MD   Reason for Endocrinology Evaluation: Type 2 Diabetes Mellitus  Initial Endocrine Consultative Visit: 08/15/2019    PATIENT IDENTIFIER: [name] is a 75 y.o. female with a past medical history of T2DM, CVA, and Dyslipidemia . The patient has followed with Endocrinology clinic since 08/15/2019 for consultative assistance with management of her diabetes.  DIABETIC HISTORY:  Sarah [name] was diagnosed with DM many years ago. Her hemoglobin A1c has ranged from 10.3% in 2020, peaking at 16.3% in 2016.   On her initial visit to our clinic she had an A1c of 12.5 % She was on MDI regimen but was not taking Metformin due to GI side effects so we stopped it. We adjusted MDI regimen and continued Victoza   Lives with mother who is 30 yrs ago  SUBJECTIVE:   During the last visit (08/15/2019): A1c 12.5 % Stopped metformin, continued victoza and adjusted MDI regimen       Today (03/03/2020): Sarah [name] is here for a follow up on diabetes management. She has not been to our clinic in 6 months.  She checks her blood sugars 2 times weekly . The patient has not had hypoglycemic episodes since the last clinic visit  Pt admits to medication non adherence    SOB is improving     HOME DIABETES REGIMEN:  Victoza 1.8 mg daily  Lantus  60 units daily  Humalog 15 units with each meal       Statin: yes ACE-I/ARB: yes    METER DOWNLOAD SUMMARY: D  DIABETIC COMPLICATIONS: Microvascular complications:   Retinopathy, neuropathy   Denies: CKD  Last Eye Exam: Completed 02/2019  Macrovascular complications:   CVA  Denies: CAD, PVD   HISTORY:  Past Medical History:  Past Medical History:  Diagnosis Date  . Bilateral carpal tunnel syndrome 03/27/2019  . Boils   . Glaucoma   . Heart murmur   . HTN (hypertension)   . Hx of adenomatous colonic polyps    . Hyperlipidemia   . Osteoporosis   . Pneumonia   . Stroke ([place])   . Type II or unspecified type diabetes mellitus without mention of complication, uncontrolled    Past Surgical History:  Past Surgical History:  Procedure Laterality Date  . ABDOMINAL HYSTERECTOMY    . CARPAL TUNNEL RELEASE     right  . COLONOSCOPY  12-10-10   per Dr. Deatra Ina, clear, repeat in 7 yrs   . INCISION AND DRAINAGE PERIRECTAL ABSCESS N/A 09/26/2015   Procedure: IRRIGATION AND DEBRIDEMENT PERIRECTAL ABSCESS;  Surgeon: Mickeal Skinner, MD;  Location: [place];  Service: General;  Laterality: N/A;  . KNEE ARTHROSCOPY     right  . LOOP RECORDER INSERTION N/A 10/19/2016   Procedure: LOOP RECORDER INSERTION;  Surgeon: Thompson Grayer, MD;  Location: [place] [facility];  Service: Cardiovascular;  Laterality: N/A;  . ORIF ANKLE FRACTURE Right 03/24/2012   Procedure: OPEN REDUCTION INTERNAL FIXATION (ORIF) ANKLE FRACTURE;  Surgeon: Newt Minion, MD;  Location: [place];  Service: Orthopedics;  Laterality: Right;  Open Reduction Internal Fixation Right Bimalleolar ankle fracture  . POLYPECTOMY    . TEE WITHOUT CARDIOVERSION N/A 10/18/2016   Procedure: TRANSESOPHAGEAL ECHOCARDIOGRAM (TEE);  Surgeon: Fay Records, MD;  Location: [employer];  Service: Cardiovascular;  Laterality: N/A;  . TONSILLECTOMY      Social History:  reports that she quit smoking about 3 years ago. Her smoking use included cigarettes. She has never used smokeless tobacco. She reports current alcohol use. She reports that she does not use drugs. Family History:  Family History  Problem Relation Age of Onset  . Diabetes Mother   . Hypertension Mother   . Stroke Father   . Stroke Maternal Uncle   . Stroke Paternal Uncle   . Colon cancer Neg Hx   . Esophageal cancer Neg Hx   . Rectal cancer Neg Hx   . Stomach cancer Neg Hx      HOME MEDICATIONS: Allergies as of 03/03/2020   No Known Allergies     Medication List       Accurate as of  March 03, 2020  3:18 PM. If you have any questions, ask your nurse or doctor.        STOP taking these medications   famotidine 40 MG tablet Commonly known as: PEPCID Stopped by: Dorita Sciara, MD   metoprolol tartrate 100 MG tablet Commonly known as: LOPRESSOR Stopped by: Dorita Sciara, MD   Vitamin D (Ergocalciferol) 1.25 MG (50000 UNIT) Caps capsule Commonly known as: DRISDOL Stopped by: Dorita Sciara, MD     TAKE these medications   acetaminophen 500 MG tablet Commonly known as: TYLENOL Take 1,000 mg by mouth every 6 (six) hours as needed for mild pain or headache.   amLODipine 10 MG tablet Commonly known as: NORVASC Take 10 mg by mouth daily.   atorvastatin 80 MG tablet Commonly known as: LIPITOR Take 1 tablet (80 mg total) by mouth daily at 6 PM.   BD Pen Needle Nano U/F 32G X 4 MM Misc Generic drug: Insulin Pen Needle USE UP TO FOUR TIMES DAILY   calcium carbonate 750 MG chewable tablet Commonly known as: TUMS EX Chew 1 tablet by mouth as needed for heartburn.   clopidogrel 75 MG tablet Commonly known as: PLAVIX TAKE 1 TABLET BY MOUTH DAILY   dapagliflozin propanediol 5 MG Tabs tablet Commonly known as: Farxiga Take 1 tablet (5 mg total) by mouth daily before breakfast. Started by: Dorita Sciara, MD   Dexcom G6 Sensor Misc 1 Device by Does not apply route as directed. Started by: Dorita Sciara, MD   Dexcom G6 Transmitter Misc 1 Device by Does not apply route as directed. Started by: Dorita Sciara, MD   diclofenac Sodium 1 % Gel Commonly known as: Voltaren Apply 2 g topically 4 (four) times daily.   ezetimibe 10 MG tablet Commonly known as: Zetia Take 1 tablet (10 mg total) by mouth daily.   furosemide 20 MG tablet Commonly known as: LASIX TAKE 1 TABLET(20 MG) BY MOUTH DAILY   HumaLOG KwikPen 200 UNIT/ML KwikPen Generic drug: insulin lispro Inject 15 Units into the skin with breakfast, with lunch,  and with evening meal. 15 U 15 min before meals   Lantus SoloStar 100 UNIT/ML Solostar Pen Generic drug: insulin glargine Inject 60 Units into the skin daily.   losartan 50 MG tablet Commonly known as: COZAAR TAKE 1 TABLET(50 MG) BY MOUTH DAILY   nitroGLYCERIN 0.4 MG SL tablet Commonly known as: NITROSTAT Place 1 tablet (0.4 mg total) under the tongue every 5 (five) minutes as needed for chest pain. one tab every 5 minutes up to 3 tablets total over 15 minutes.   omeprazole 20 MG capsule Commonly known as: PRILOSEC Take 1 capsule (20 mg total) by mouth daily.  ONE TOUCH ULTRA TEST test strip Generic drug: glucose blood Check BS before and 2 hours after meals.   OT ULTRA/FASTTK CNTRL SOLN Soln   Pharmacist Choice Alcohol 70 % Misc Generic drug: Isopropyl Alcohol   Rocklatan 0.02-0.005 % Soln Generic drug: Netarsudil-Latanoprost Place 1 drop into both eyes every evening.   sertraline 25 MG tablet Commonly known as: Zoloft Take 1 tablet (25 mg total) by mouth daily.   Simple Diagnostics Lancing Dev Misc Check BS before and 2 hours after meals.   Victoza 18 MG/3ML Sopn Generic drug: liraglutide ADMINISTER 1.8 MG UNDER THE SKIN DAILY        OBJECTIVE:   Vital Signs: BP 136/76   Pulse 80   Ht 5' (1.524 m)   Wt 135 lb 4 oz (61.3 kg)   SpO2 98%   BMI 26.41 kg/m   Wt Readings from Last 3 Encounters:  03/03/20 135 lb 4 oz (61.3 kg)  02/25/20 138 lb (62.6 kg)  02/12/20 140 lb 9.6 oz (63.8 kg)     Exam: General: Pt appears well and is in NAD  Neck: General: Supple without adenopathy. Thyroid: Thyroid size normal.  No goiter or nodules appreciated.  Lungs: Clear with good BS bilat with no rales, rhonchi, or wheezes  Heart: RRR with normal S1 and S2 and no gallops; no murmurs; no rub  Abdomen: Normoactive bowel sounds, soft, nontender, without masses or organomegaly palpable  Extremities: No pretibial edema.   Neuro: MS is good with appropriate affect, pt is  alert and Ox3   DM foot exam: 8//04/2019  The skin of the feet is intact without sores or ulcerations. The pedal pulses are 1+ on right and 1+ on left. The sensation is decreased  to a screening 5.07, 10 gram monofilament bilaterally    DATA REVIEWED:  Lab Results  Component Value Date   HGBA1C 14.3 (A) 03/03/2020   HGBA1C 12.5 (H) 08/01/2019   HGBA1C 12.3 (A) 04/27/2019   Lab Results  Component Value Date   MICROALBUR 96.5 (H) 04/27/2019   LDLCALC 115 (H) 04/27/2019   CREATININE 0.54 (L) 02/25/2020   Lab Results  Component Value Date   MICRALBCREAT 116.0 (H) 04/27/2019     Lab Results  Component Value Date   CHOL 203 (H) 04/27/2019   HDL 65.10 04/27/2019   LDLCALC 115 (H) 04/27/2019   TRIG 114.0 04/27/2019   CHOLHDL 3 04/27/2019         ASSESSMENT / PLAN / RECOMMENDATIONS:   1) Type 2 Diabetes Mellitus, Poorly controlled, With neuropathic and  macrovascular  complications - Most recent A1c of 14.3 %. Goal A1c < 7.0 %.    - Poorly controlled diabetes due to medication, non -adherence, dietary indiscretions and lack of glucose checks at home  - I am going to prescribed Dexcom  - We also discussed add-on therapy with SGLT-2 inhibitors , cautioned against genital infections  - Will adjust prandial insulin, discussed importance of medication compliance - In office BG 322 mg/dL , she ate cheese , peaches 4 hours ago withOUT prandial insulin and had some sips of juice from her grandson.    MEDICATIONS: - Start Farxiga 5 mg, 1 tablet daily  - Continue Victoza 1.8 mg daily  - Continue  Lantus 60 units daily  - Increase Humalog to 18 units with each meal    EDUCATION / INSTRUCTIONS:  BG monitoring instructions: Patient is instructed to check her blood sugars 3 times a day, before meals  Call [facility] if: BG persistently < 70 . I reviewed the Rule of 15 for the treatment of hypoglycemia in detail with the patient. Literature supplied.   2)  Diabetic complications:   Eye: there's a questionable diabetic retinopathy from her history  Neuro/ Feet: Does have known diabetic peripheral neuropathy .   Renal: Patient does not have known baseline CKD. She   is  on an ACEI/ARB at present.       F/U in 3 months    Signed electronically by: Mack Guise, MD  [facility]  [address] [place], [place] [address] Phone: [phone] FAX: [phone]   CC: [name], [name], [place] [address] Phone: [phone]  Fax: [phone]  Return to Endocrinology clinic as below: Future Appointments  Date Time Provider [place]  03/03/2020  3:20 PM Jeovani Weisenburger, Melanie Crazier, MD [facility] None  03/04/2020  9:20 AM Werner Lean, MD [facility]  03/13/2020  3:45 PM Valora Piccolo Barnabas Lister, RD [place] [facility]  03/19/2020  2:45 PM [facility]-NURSE HEALTH ADVISOR [facility]  04/03/2020  9:45 AM [facility] DEVICE REMOTES [facility]  05/05/2020  9:45 AM [facility] DEVICE REMOTES [facility]  05/16/2020  8:40 AM Werner Lean, MD [facility]  06/05/2020  9:45 AM [facility] DEVICE REMOTES [facility]  07/07/2020  9:45 AM [facility] DEVICE REMOTES [facility]  07/16/2020  3:00 PM [name], [name], MD [facility]  08/07/2020  9:45 AM [facility] DEVICE REMOTES [facility]  09/08/2020  9:45 AM [facility] DEVICE REMOTES [facility]  10/09/2020  9:45 AM [facility] DEVICE REMOTES [facility]  11/10/2020  9:45 AM [facility] DEVICE REMOTES [facility]  12/11/2020  9:45 AM [facility] DEVICE REMOTES [facility]  02/12/2021  9:45 AM [facility] DEVICE REMOTES [facility]  03/16/2021  9:45 AM [facility] DEVICE REMOTES [facility]  04/16/2021  9:45 AM [facility] DEVICE REMOTES [facility]  [facility]  05/18/2021  9:45 AM [facility] DEVICE REMOTES [facility]  06/18/2021  9:45 AM [facility] DEVICE REMOTES [facility]  07/20/2021  9:45 AM [facility] DEVICE REMOTES [facility]  08/20/2021  9:45 AM [facility] DEVICE REMOTES [facility]  09/21/2021  9:45 AM [facility] DEVICE REMOTES [facility]  10/22/2021  9:45 AM [facility] DEVICE REMOTES [facility]  11/23/2021  9:45 AM [facility] DEVICE REMOTES [facility]  12/24/2021  9:45 AM [facility] DEVICE REMOTES [facility]

## 2020-03-04 ENCOUNTER — Encounter: Payer: Self-pay | Admitting: Internal Medicine

## 2020-03-04 ENCOUNTER — Ambulatory Visit: Payer: Medicare Other | Admitting: Internal Medicine

## 2020-03-04 VITALS — BP 122/70 | HR 78 | Ht 60.0 in | Wt 136.0 lb

## 2020-03-04 DIAGNOSIS — R0600 Dyspnea, unspecified: Secondary | ICD-10-CM

## 2020-03-04 DIAGNOSIS — R0609 Other forms of dyspnea: Secondary | ICD-10-CM

## 2020-03-04 DIAGNOSIS — I25118 Atherosclerotic heart disease of native coronary artery with other forms of angina pectoris: Secondary | ICD-10-CM

## 2020-03-04 DIAGNOSIS — I700 Atherosclerosis of aorta: Secondary | ICD-10-CM | POA: Diagnosis not present

## 2020-03-04 DIAGNOSIS — E119 Type 2 diabetes mellitus without complications: Secondary | ICD-10-CM | POA: Diagnosis not present

## 2020-03-04 DIAGNOSIS — I10 Essential (primary) hypertension: Secondary | ICD-10-CM | POA: Diagnosis not present

## 2020-03-04 DIAGNOSIS — E782 Mixed hyperlipidemia: Secondary | ICD-10-CM | POA: Diagnosis not present

## 2020-03-04 DIAGNOSIS — I6529 Occlusion and stenosis of unspecified carotid artery: Secondary | ICD-10-CM

## 2020-03-04 LAB — CBC
Hematocrit: 37.3 % (ref 34.0–46.6)
Hemoglobin: 12.1 g/dL (ref 11.1–15.9)
MCH: 30.4 pg (ref 26.6–33.0)
MCHC: 32.4 g/dL (ref 31.5–35.7)
MCV: 94 fL (ref 79–97)
Platelets: 342 10*3/uL (ref 150–450)
RBC: 3.98 x10E6/uL (ref 3.77–5.28)
RDW: 11.8 % (ref 11.7–15.4)
WBC: 7.5 10*3/uL (ref 3.4–10.8)

## 2020-03-04 LAB — BASIC METABOLIC PANEL
BUN/Creatinine Ratio: 25 (ref 12–28)
BUN: 16 mg/dL (ref 8–27)
CO2: 21 mmol/L (ref 20–29)
Calcium: 9.7 mg/dL (ref 8.7–10.3)
Chloride: 102 mmol/L (ref 96–106)
Creatinine, Ser: 0.65 mg/dL (ref 0.57–1.00)
GFR calc Af Amer: 103 mL/min/{1.73_m2} (ref >59)
GFR calc non Af Amer: 89 mL/min/{1.73_m2} (ref >59)
Glucose: 375 mg/dL — ABNORMAL HIGH (ref 65–99)
Potassium: 4.2 mmol/L (ref 3.5–5.2)
Sodium: 137 mmol/L (ref 134–144)

## 2020-03-04 MED ORDER — ASPIRIN EC 81 MG PO TBEC: DELAYED_RELEASE_TABLET | ORAL | 11 refills | Status: DC

## 2020-03-04 MED ORDER — METOPROLOL TARTRATE 25 MG PO TABS: 12.5000 mg | ORAL_TABLET | Freq: Two times a day (BID) | ORAL | 3 refills | Status: DC

## 2020-03-04 NOTE — H&P (View-Only) (Signed)
Cardiology Office Note:    Date:  03/04/2020   ID:  Sarah [name], Sarah [name] 1945/12/03, MRN [id number]  PCP:  [name], [name], MD  [facility] Cardiologist:  Rudean Haskell MD [place] Electrophysiologist:  None   CC: Follow up [place] Consulted for the evaluation of DOE at the behest of [name], Malka So, MD  History of Present Illness:    Sarah [name] is a 75 y.o. female with a hx of DM with HTN, Prior Stroke in 2018, Aortic atherosclerosis who presents for evaluation 02/12/20. In interim of this visit, patient had echocardiogram that was relatively benign and a positive CCTA for RCA disease.  Patient notes that she is doing the same since last visit.  Since day last visit notes no changes   Relevant interval testing or therapy include CCTA showing mRCA disease and relatively normal echo.  There are no interval hospital/ED visit.  She had a dentist appointment 03/05/20 to discus long term treatment  Notes some chest pressure in her sternum that appears random and not affected by activity.  Notes no chagne in DOE but no PND/Orthopnea.  No weight gain, no change in her leg swelling.  No palpitations or syncope.   Past Medical History:  Diagnosis Date  . Bilateral carpal tunnel syndrome 03/27/2019  . Boils   . Glaucoma   . Heart murmur   . HTN (hypertension)   . Hx of adenomatous colonic polyps   . Hyperlipidemia   . Osteoporosis   . Pneumonia   . Stroke ([place])   . Type II or unspecified type diabetes mellitus without mention of complication, uncontrolled     Past Surgical History:  Procedure Laterality Date  . ABDOMINAL HYSTERECTOMY    . CARPAL TUNNEL RELEASE     right  . COLONOSCOPY  12-10-10   per Dr. Deatra Ina, clear, repeat in 7 yrs   . INCISION AND DRAINAGE PERIRECTAL ABSCESS N/A 09/26/2015   Procedure: IRRIGATION AND DEBRIDEMENT PERIRECTAL ABSCESS;  Surgeon: Mickeal Skinner, MD;  Location: [place];  Service: General;  Laterality: N/A;  . KNEE  ARTHROSCOPY     right  . LOOP RECORDER INSERTION N/A 10/19/2016   Procedure: LOOP RECORDER INSERTION;  Surgeon: Thompson Grayer, MD;  Location: [place] [facility];  Service: Cardiovascular;  Laterality: N/A;  . ORIF ANKLE FRACTURE Right 03/24/2012   Procedure: OPEN REDUCTION INTERNAL FIXATION (ORIF) ANKLE FRACTURE;  Surgeon: Newt Minion, MD;  Location: [place];  Service: Orthopedics;  Laterality: Right;  Open Reduction Internal Fixation Right Bimalleolar ankle fracture  . POLYPECTOMY    . TEE WITHOUT CARDIOVERSION N/A 10/18/2016   Procedure: TRANSESOPHAGEAL ECHOCARDIOGRAM (TEE);  Surgeon: Fay Records, MD;  Location: [facility];  Service: Cardiovascular;  Laterality: N/A;  . TONSILLECTOMY      Current Medications: Current Meds  Medication Sig  . acetaminophen (TYLENOL) 500 MG tablet Take 1,000 mg by mouth every 6 (six) hours as needed for mild pain or headache.  Marland Kitchen amLODipine (NORVASC) 10 MG tablet Take 10 mg by mouth daily.  Marland Kitchen atorvastatin (LIPITOR) 80 MG tablet Take 1 tablet (80 mg total) by mouth daily at 6 PM.  . Blood Glucose Calibration (OT ULTRA/FASTTK CNTRL SOLN) SOLN   . calcium carbonate (TUMS EX) 750 MG chewable tablet Chew 1 tablet by mouth as needed for heartburn.  . clopidogrel (PLAVIX) 75 MG tablet TAKE 1 TABLET BY MOUTH DAILY  . Continuous Blood Gluc Sensor (DEXCOM G6 SENSOR) MISC 1 Device by Does not  apply route as directed.  . Continuous Blood Gluc Transmit (DEXCOM G6 TRANSMITTER) MISC 1 Device by Does not apply route as directed.  . dapagliflozin propanediol (FARXIGA) 5 MG TABS tablet Take 1 tablet (5 mg total) by mouth daily before breakfast.  . diclofenac Sodium (VOLTAREN) 1 % GEL Apply 2 g topically 4 (four) times daily.  Marland Kitchen ezetimibe (ZETIA) 10 MG tablet Take 1 tablet (10 mg total) by mouth daily.  . furosemide (LASIX) 20 MG tablet TAKE 1 TABLET(20 MG) BY MOUTH DAILY  . insulin glargine (LANTUS SOLOSTAR) 100 UNIT/ML Solostar Pen Inject 60 Units into the skin daily.  .  insulin lispro (HUMALOG KWIKPEN) 200 UNIT/ML KwikPen Inject 18 Units into the skin with breakfast, with lunch, and with evening meal. 15 U 15 min before meals  . Insulin Pen Needle 32G X 4 MM MISC 1 Device by Does not apply route in the morning, at noon, in the evening, and at bedtime.  Elmore Guise Devices (SIMPLE DIAGNOSTICS LANCING DEV) MISC Check BS before and 2 hours after meals.  . liraglutide (VICTOZA) 18 MG/3ML SOPN Inject 1.8 mg into the skin daily. ADMINISTER 1.8 MG UNDER THE SKIN DAILY  . losartan (COZAAR) 50 MG tablet TAKE 1 TABLET(50 MG) BY MOUTH DAILY  . nitroGLYCERIN (NITROSTAT) 0.4 MG SL tablet Place 1 tablet (0.4 mg total) under the tongue every 5 (five) minutes as needed for chest pain. one tab every 5 minutes up to 3 tablets total over 15 minutes.  Marland Kitchen omeprazole (PRILOSEC) 20 MG capsule Take 1 capsule (20 mg total) by mouth daily.  . ONE TOUCH ULTRA TEST test strip Check BS before and 2 hours after meals.  Marland Kitchen PHARMACIST CHOICE ALCOHOL 70 % PADS   . ROCKLATAN 0.02-0.005 % SOLN Place 1 drop into both eyes every evening.  . sertraline (ZOLOFT) 25 MG tablet Take 1 tablet (25 mg total) by mouth daily.     Allergies:   Patient has no known allergies.   Social History   Socioeconomic History  . Marital status: Single    Spouse name: Not on file  . Number of children: Not on file  . Years of education: Not on file  . Highest education level: Not on file  Occupational History  . Not on file  Tobacco Use  . Smoking status: Former Smoker    Types: Cigarettes    Quit date: 10/15/2016    Years since quitting: 3.3  . Smokeless tobacco: Never Used  . Tobacco comment: smokes occ.   Vaping Use  . Vaping Use: Never used  Substance and Sexual Activity  . Alcohol use: Yes    Alcohol/week: 0.0 standard drinks    Comment: occ  . Drug use: No  . Sexual activity: Not on file  Other Topics Concern  . Not on file  Social History Narrative  . Not on file   Social Determinants of Health    Financial Resource Strain: Not on file  Food Insecurity: No Food Insecurity  . Worried About Charity fundraiser in the Last Year: Never true  . Ran Out of Food in the Last Year: Never true  Transportation Needs: Not on file  Physical Activity: Not on file  Stress: Stress Concern Present  . Feeling of Stress : To some extent  Social Connections: Not on file     Family History: The patient's family history includes Diabetes in her mother; Hypertension in her mother; Stroke in her father, maternal uncle, and paternal uncle. There  is no history of Colon cancer, Esophageal cancer, Rectal cancer, or Stomach cancer. History of coronary artery disease notable for no members. History of heart failure notable for father. History of arrhythmia notable for no members.  ROS:   Please see the history of present illness.     All other systems reviewed and are negative.  EKGs/Labs/Other Studies Reviewed:    The following studies were reviewed today:  EKG:   03/04/20: SR rate 78 nonspecific TW flattening 02/12/2020: SR rate 81 WNL  LINQ monitor: Date: 01/31/20 Results: No AF in many reports reviewed  Transthoracic Echocardiogram: Date: 02/09/2018 Results: 1. The left ventricle has normal systolic function of 29-24%. The cavity  size is normal. There is no left ventricular wall thickness. Echo evidence  of impaired relaxation diastolic filling patterns. Indeterminent filling  pressures.  2. Normal left atrial size.  3. Normal right atrial size.  4. Normal tricuspid valve.  5. The aortic valve normal. There is mild thickening and mild  calcification of the aortic valve.  6. No atrial level shunt detected by color flow Doppler.   Transesophageal Echocardiogram: Date: 10/19/2015 Results: Mild fixed plaquing of the thoracic aorta no thrombus  Cardiac CT: Date:   Results: Aortic Atherosclerosis IMPRESSION: 1. Coronary calcium score of 25. This was 22 percentile for age and sex  matched control.  2. Normal coronary origin with right dominance.  3. Multivessel CAD - mild LAD disease, moderate to severe stenosis of Circumflex and RCA. Sending for FFR analysis.  4.  Aortic atherosclerosis descending. FFR 1. Left Main: Normal  2. LAD: Proximal - 0.95, Mid - 0.87, Distal 0.71 - Abnormal  3. LCX: Proximal - 0.98, Mid 0.89, Distal 0.84 - Normal  4. Ramus: n/a  5. RCA: Proximal 0.99, Mid 0.51, Distal <0.50 - Abnormal    Recent Labs: 08/01/2019: ALT 9; Hemoglobin 12.2; Platelets 256; TSH 1.67 10/24/2019: Brain Natriuretic Peptide 72 01/21/2020: Magnesium 1.9 02/12/2020: NT-Pro BNP 132 02/25/2020: BUN 15; Creatinine, Ser 0.54; Potassium 4.1; Sodium 137  Recent Lipid Panel    Component Value Date/Time   CHOL 203 (H) 04/27/2019 1100   TRIG 114.0 04/27/2019 1100   HDL 65.10 04/27/2019 1100   CHOLHDL 3 04/27/2019 1100   VLDL 22.8 04/27/2019 1100   LDLCALC 115 (H) 04/27/2019 1100    Risk Assessment/Calculations:     N/A  Physical Exam:    VS:  BP 122/70   Pulse 78   Ht 5' (1.524 m)   Wt 136 lb (61.7 kg)   SpO2 96%   BMI 26.56 kg/m     Wt Readings from Last 3 Encounters:  03/04/20 136 lb (61.7 kg)  03/03/20 135 lb 4 oz (61.3 kg)  02/25/20 138 lb (62.6 kg)     GEN:  Well nourished, well developed in no acute distress HEENT: Normal NECK: No JVD; No carotid bruits LYMPHATICS: No lymphadenopathy CARDIAC: RRR, no murmurs, rubs, gallops R Radial +2 RESPIRATORY:  Clear to auscultation without rales, wheezing or rhonchi  ABDOMEN: Soft, non-tender, non-distended MUSCULOSKELETAL:  +1 edema bilaterally ; No deformity  SKIN: Warm and dry NEUROLOGIC:  Alert and oriented x 3 PSYCHIATRIC:  Normal affect   ASSESSMENT:    1. Coronary artery disease of native artery of native heart with stable angina pectoris ([place])   2. Stenosis of carotid artery, unspecified laterality   3. Mixed hyperlipidemia   4. Diabetes mellitus with coincident hypertension  ([place])   5. DOE (dyspnea on exertion)   6. Aortic atherosclerosis ([place])  PLAN:    In order of problems listed above:  Coronary Artery Disease; Obstructive Prior stroke on plavix HLD - symptomatic  - anatomy: mRCA +/- LCx - continue ASA 81 mg; stop plavix until post catheterization - will start for metoprolol 12.5 mg PO BID - continue nitrates; PRN - Would recommend LCP; Risks and benefits of cardiac catheterization have been discussed with the patient.  These include bleeding, infection, kidney damage, stroke, heart attack, death.  The patient understands these risks and is willing to proceed.  Diabetes with hypertension LE Edema - ambulatory blood pressure at goal,  will continue ambulatory BP monitoring; gave education on how to perform ambulatory blood pressure monitoring including the frequency and technique; goal ambulatory blood pressure < 135/85 on average - continue home medications except as above - discussed diet (DASH/low sodium), and exercise/weight loss interventions - low threshold to increase her lasix if leg swelling persists or worsens  Hyperlipidemia (mixed) Aortic Atherosclerosis -LDL goal less than 70 - will consider lipid clinic referral at next visit after post cath lipids and LFTS (post cath) -continue current statin and Zetia - gave education on dietary changes  Post Cath follow up unless new symptoms or abnormal test results warranting change in plan  Would be reasonable for  APP Follow up  Time Spent Directly with Patient:   I have spent a total of 40 minutes with the patient reviewing hospital notes, telemetry, EKGs, labs and examining the patient as well as establishing an assessment and plan that was discussed personally with the patient.  > 50% of time was spent in direct patient care- discussion of heart catheterization.   Medication Adjustments/Labs and Tests Ordered: Current medicines are reviewed at length with the patient today.  Concerns  regarding medicines are outlined above.  No orders of the defined types were placed in this encounter.  No orders of the defined types were placed in this encounter.   There are no Patient Instructions on file for this visit.   Signed, Werner Lean, MD  03/04/2020 9:56 AM    [place] \

## 2020-03-04 NOTE — Progress Notes (Signed)
Cardiology Office Note:    Date:  03/04/2020   ID:  Sarah [name], Sarah [name] 29-Oct-1945, MRN [id number]  PCP:  Sarah [name], Sarah G, MD  [facility] Cardiologist:  Sarah Haskell MD [place] Electrophysiologist:  None   CC: Follow up [place] Consulted for the evaluation of DOE at the behest of Sarah [name], Sarah So, MD  History of Present Illness:    Sarah [name] is a 75 y.o. female with a hx of DM with HTN, Prior Stroke in 2018, Aortic atherosclerosis who presents for evaluation 02/12/20. In interim of this visit, patient had echocardiogram that was relatively benign and a positive CCTA for RCA disease.  Patient notes that she is doing the same since last visit.  Since day last visit notes no changes   Relevant interval testing or therapy include CCTA showing mRCA disease and relatively normal echo.  There are no interval hospital/ED visit.  She had a dentist appointment 03/05/20 to discus long term treatment  Notes some chest pressure in her sternum that appears random and not affected by activity.  Notes no chagne in DOE but no PND/Orthopnea.  No weight gain, no change in her leg swelling.  No palpitations or syncope.   Past Medical History:  Diagnosis Date  . Bilateral carpal tunnel syndrome 03/27/2019  . Boils   . Glaucoma   . Heart murmur   . HTN (hypertension)   . Hx of adenomatous colonic polyps   . Hyperlipidemia   . Osteoporosis   . Pneumonia   . Stroke ([place])   . Type II or unspecified type diabetes mellitus without mention of complication, uncontrolled     Past Surgical History:  Procedure Laterality Date  . ABDOMINAL HYSTERECTOMY    . CARPAL TUNNEL RELEASE     right  . COLONOSCOPY  12-10-10   per Dr. Deatra [name], clear, repeat in 7 yrs   . INCISION AND DRAINAGE PERIRECTAL ABSCESS N/A 09/26/2015   Procedure: IRRIGATION AND DEBRIDEMENT PERIRECTAL ABSCESS;  Surgeon: Sarah Skinner, MD;  Location: [place];  Service: General;  Laterality: N/A;  . KNEE  ARTHROSCOPY     right  . LOOP RECORDER INSERTION N/A 10/19/2016   Procedure: LOOP RECORDER INSERTION;  Surgeon: Sarah Grayer, MD;  Location: [place] [facility];  Service: Cardiovascular;  Laterality: N/A;  . ORIF ANKLE FRACTURE Right 03/24/2012   Procedure: OPEN REDUCTION INTERNAL FIXATION (ORIF) ANKLE FRACTURE;  Surgeon: Sarah Minion, MD;  Location: [place];  Service: Orthopedics;  Laterality: Right;  Open Reduction Internal Fixation Right Bimalleolar ankle fracture  . POLYPECTOMY    . TEE WITHOUT CARDIOVERSION N/A 10/18/2016   Procedure: TRANSESOPHAGEAL ECHOCARDIOGRAM (TEE);  Surgeon: Sarah Records, MD;  Location: [facility] END[facility];  Service: Cardiovascular;  Laterality: N/A;  . TONSILLECTOMY      Current Medications: Current Meds  Medication Sig  . acetaminophen (TYLENOL) 500 MG tablet Take 1,000 mg by mouth every 6 (six) hours as needed for mild pain or headache.  Marland Kitchen amLODipine (NORVASC) 10 MG tablet Take 10 mg by mouth daily.  Marland Kitchen atorvastatin (LIPITOR) 80 MG tablet Take 1 tablet (80 mg total) by mouth daily at 6 PM.  . Blood Glucose Calibration (OT ULTRA/FASTTK CNTRL SOLN) SOLN   . calcium carbonate (TUMS EX) 750 MG chewable tablet Chew 1 tablet by mouth as needed for heartburn.  . clopidogrel (PLAVIX) 75 MG tablet TAKE 1 TABLET BY MOUTH DAILY  . Continuous Blood Gluc Sensor (DEXCOM G6 SENSOR) MISC 1 Device by Does not  apply route as directed.  . Continuous Blood Gluc Transmit (DEXCOM G6 TRANSMITTER) MISC 1 Device by Does not apply route as directed.  . dapagliflozin propanediol (FARXIGA) 5 MG TABS tablet Take 1 tablet (5 mg total) by mouth daily before breakfast.  . diclofenac Sodium (VOLTAREN) 1 % GEL Apply 2 [name] topically 4 (four) times daily.  Marland Kitchen ezetimibe (ZETIA) 10 MG tablet Take 1 tablet (10 mg total) by mouth daily.  . furosemide (LASIX) 20 MG tablet TAKE 1 TABLET(20 MG) BY MOUTH DAILY  . insulin glargine (LANTUS SOLOSTAR) 100 UNIT/ML Solostar Pen Inject 60 Units into the skin daily.  .  insulin lispro (HUMALOG KWIKPEN) 200 UNIT/ML KwikPen Inject 18 Units into the skin with breakfast, with lunch, and with evening meal. 15 U 15 min before meals  . Insulin Pen Needle 32G X 4 MM MISC 1 Device by Does not apply route in the morning, at noon, in the evening, and at bedtime.  Elmore Guise Devices (SIMPLE DIAGNOSTICS LANCING DEV) MISC Check BS before and 2 hours after meals.  . liraglutide (VICTOZA) 18 MG/3ML SOPN Inject 1.8 mg into the skin daily. ADMINISTER 1.8 MG UNDER THE SKIN DAILY  . losartan (COZAAR) 50 MG tablet TAKE 1 TABLET(50 MG) BY MOUTH DAILY  . nitroGLYCERIN (NITROSTAT) 0.4 MG SL tablet Place 1 tablet (0.4 mg total) under the tongue every 5 (five) minutes as needed for chest pain. one tab every 5 minutes up to 3 tablets total over 15 minutes.  Marland Kitchen omeprazole (PRILOSEC) 20 MG capsule Take 1 capsule (20 mg total) by mouth daily.  . ONE TOUCH ULTRA TEST test strip Check BS before and 2 hours after meals.  Marland Kitchen PHARMACIST CHOICE ALCOHOL 70 % PADS   . ROCKLATAN 0.02-0.005 % SOLN Place 1 drop into both eyes every evening.  . sertraline (ZOLOFT) 25 MG tablet Take 1 tablet (25 mg total) by mouth daily.     Allergies:   Patient has no known allergies.   Social History   Socioeconomic History  . Marital status: Single    Spouse name: Not on file  . Number of children: Not on file  . Years of education: Not on file  . Highest education level: Not on file  Occupational History  . Not on file  Tobacco Use  . Smoking status: Former Smoker    Types: Cigarettes    Quit date: 10/15/2016    Years since quitting: 3.3  . Smokeless tobacco: Never Used  . Tobacco comment: smokes occ.   Vaping Use  . Vaping Use: Never used  Substance and Sexual Activity  . Alcohol use: Yes    Alcohol/week: 0.0 standard drinks    Comment: occ  . Drug use: No  . Sexual activity: Not on file  Other Topics Concern  . Not on file  Social History Narrative  . Not on file   Social Determinants of Health    Financial Resource Strain: Not on file  Food Insecurity: No Food Insecurity  . Worried About Charity fundraiser in the Last Year: Never true  . Ran Out of Food in the Last Year: Never true  Transportation Needs: Not on file  Physical Activity: Not on file  Stress: Stress Concern Present  . Feeling of Stress : To some extent  Social Connections: Not on file     Family History: The patient's family history includes Diabetes in her mother; Hypertension in her mother; Stroke in her father, maternal uncle, and paternal uncle. There  is no history of Colon cancer, Esophageal cancer, Rectal cancer, or Stomach cancer. History of coronary artery disease notable for no members. History of heart failure notable for father. History of arrhythmia notable for no members.  ROS:   Please see the history of present illness.     All other systems reviewed and are negative.  EKGs/Labs/Other Studies Reviewed:    The following studies were reviewed today:  EKG:   03/04/20: SR rate 78 nonspecific TW flattening 02/12/2020: SR rate 81 WNL  LINQ monitor: Date: 01/31/20 Results: No AF in many reports reviewed  Transthoracic Echocardiogram: Date: 02/09/2018 Results: 1. The left ventricle has normal systolic function of 29-92%. The cavity  size is normal. There is no left ventricular wall thickness. Echo evidence  of impaired relaxation diastolic filling patterns. Indeterminent filling  pressures.  2. Normal left atrial size.  3. Normal right atrial size.  4. Normal tricuspid valve.  5. The aortic valve normal. There is mild thickening and mild  calcification of the aortic valve.  6. No atrial level shunt detected by color flow Doppler.   Transesophageal Echocardiogram: Date: 10/19/2015 Results: Mild fixed plaquing of the thoracic aorta no thrombus  Cardiac CT: Date:   Results: Aortic Atherosclerosis IMPRESSION: 1. Coronary calcium score of 25. This was 54 percentile for age and sex  matched control.  2. Normal coronary origin with right dominance.  3. Multivessel CAD - mild LAD disease, moderate to severe stenosis of Circumflex and RCA. Sending for FFR analysis.  4.  Aortic atherosclerosis descending. FFR 1. Left Main: Normal  2. LAD: Proximal - 0.95, Mid - 0.87, Distal 0.71 - Abnormal  3. LCX: Proximal - 0.98, Mid 0.89, Distal 0.84 - Normal  4. Ramus: n/a  5. RCA: Proximal 0.99, Mid 0.51, Distal <0.50 - Abnormal    Recent Labs: 08/01/2019: ALT 9; Hemoglobin 12.2; Platelets 256; TSH 1.67 10/24/2019: Brain Natriuretic Peptide 72 01/21/2020: Magnesium 1.9 02/12/2020: NT-Pro BNP 132 02/25/2020: BUN 15; Creatinine, Ser 0.54; Potassium 4.1; Sodium 137  Recent Lipid Panel    Component Value Date/Time   CHOL 203 (H) 04/27/2019 1100   TRIG 114.0 04/27/2019 1100   HDL 65.10 04/27/2019 1100   CHOLHDL 3 04/27/2019 1100   VLDL 22.8 04/27/2019 1100   LDLCALC 115 (H) 04/27/2019 1100    Risk Assessment/Calculations:     N/A  Physical Exam:    VS:  BP 122/70   Pulse 78   Ht 5' (1.524 m)   Wt 136 lb (61.7 kg)   SpO2 96%   BMI 26.56 kg/m     Wt Readings from Last 3 Encounters:  03/04/20 136 lb (61.7 kg)  03/03/20 135 lb 4 oz (61.3 kg)  02/25/20 138 lb (62.6 kg)     GEN:  Well nourished, well developed in no acute distress HEENT: Normal NECK: No JVD; No carotid bruits LYMPHATICS: No lymphadenopathy CARDIAC: RRR, no murmurs, rubs, gallops R Radial +2 RESPIRATORY:  Clear to auscultation without rales, wheezing or rhonchi  ABDOMEN: Soft, non-tender, non-distended MUSCULOSKELETAL:  +1 edema bilaterally ; No deformity  SKIN: Warm and dry NEUROLOGIC:  Alert and oriented x 3 PSYCHIATRIC:  Normal affect   ASSESSMENT:    1. Coronary artery disease of native artery of native heart with stable angina pectoris ([place])   2. Stenosis of carotid artery, unspecified laterality   3. Mixed hyperlipidemia   4. Diabetes mellitus with coincident hypertension  ([place])   5. DOE (dyspnea on exertion)   6. Aortic atherosclerosis ([place])  PLAN:    In order of problems listed above:  Coronary Artery Disease; Obstructive Prior stroke on plavix HLD - symptomatic  - anatomy: mRCA +/- LCx - continue ASA 81 mg; stop plavix until post catheterization - will start for metoprolol 12.5 mg PO BID - continue nitrates; PRN - Would recommend LCP; Risks and benefits of cardiac catheterization have been discussed with the patient.  These include bleeding, infection, kidney damage, stroke, heart attack, death.  The patient understands these risks and is willing to proceed.  Diabetes with hypertension LE Edema - ambulatory blood pressure at goal,  will continue ambulatory BP monitoring; gave education on how to perform ambulatory blood pressure monitoring including the frequency and technique; goal ambulatory blood pressure < 135/85 on average - continue home medications except as above - discussed diet (DASH/low sodium), and exercise/weight loss interventions - low threshold to increase her lasix if leg swelling persists or worsens  Hyperlipidemia (mixed) Aortic Atherosclerosis -LDL goal less than 70 - will consider lipid clinic referral at next visit after post cath lipids and LFTS (post cath) -continue current statin and Zetia - gave education on dietary changes  Post Cath follow up unless new symptoms or abnormal test results warranting change in plan  Would be reasonable for  APP Follow up  Time Spent Directly with Patient:   I have spent a total of 40 minutes with the patient reviewing hospital notes, telemetry, EKGs, labs and examining the patient as well as establishing an assessment and plan that was discussed personally with the patient.  > 50% of time was spent in direct patient care- discussion of heart catheterization.   Medication Adjustments/Labs and Tests Ordered: Current medicines are reviewed at length with the patient today.  Concerns  regarding medicines are outlined above.  No orders of the defined types were placed in this encounter.  No orders of the defined types were placed in this encounter.   There are no Patient Instructions on file for this visit.   Signed, Werner Lean, MD  03/04/2020 9:56 AM    [place] \

## 2020-03-04 NOTE — Patient Instructions (Addendum)
Medication Instructions:  Your physician has recommended you make the following change in your medication:  STOP: Plavix 75 mg until after heart cath START: Aspirin 81mg  by mouth daily  cath*If you need a refill on your cardiac medications before your next appointment, please call your pharmacy*   Lab Work: TODAY: CBC and BMET If you have labs (blood work) drawn today and your tests are completely normal, you will receive your results only by: Marland Kitchen MyChart Message (if you have MyChart) OR . A paper copy in the mail If you have any lab test that is abnormal or we need to change your treatment, we will call you to review the results.   Testing/Procedures: Your physician has requested that you have a cardiac catheterization. Cardiac catheterization is used to diagnose and/or treat various heart conditions. Doctors may recommend this procedure for a number of different reasons. The most common reason is to evaluate chest pain. Chest pain can be a symptom of coronary artery disease (CAD), and cardiac catheterization can show whether plaque is narrowing or blocking your heart's arteries. This procedure is also used to evaluate the valves, as well as measure the blood flow and oxygen levels in different parts of your heart.   Due to recent COVID-19 restrictions implemented by our local and state authorities and in an effort to keep both patients and staff as safe as possible, our hospital system requires COVID-19 testing prior to certain scheduled hospital procedures.  Please go to [place] [place], [place] [address] on FEB. 26, 2022 at 12:10pm  .  This is a drive up testing site.  You will not need to exit your vehicle.  You will not be billed at the time of testing but may receive a bill later depending on your insurance. You must agree to self-quarantine from the time of your testing until the procedure date on March 11, 2020.  This should included staying home with ONLY the people you live with.   Avoid take-out, grocery store shopping or leaving the house for any non-emergent reason.  Failure to have your COVID-19 test done on the date and time you have been scheduled will result in cancellation of your procedure.  Please call our office at [phone] if you have any questions.      Follow-Up: At [facility], you and your health needs are our priority.  As part of our continuing mission to provide you with exceptional heart care, we have created designated Provider Care Teams.  These Care Teams include your primary Cardiologist (physician) and Advanced Practice Providers (APPs -  Physician Assistants and Nurse Practitioners) who all work together to provide you with the care you need, when you need it.  We recommend signing up for the patient portal called "MyChart".  Sign up information is provided on this After Visit Summary.  MyChart is used to connect with patients for Virtual Visits (Telemedicine).  Patients are able to view lab/test results, encounter notes, upcoming appointments, etc.  Non-urgent messages can be sent to your provider as well.   To learn more about what you can do with MyChart, go to [email or web address].    Your next appointment:   3 week(s)  The format for your next appointment:   In Person  Provider:   You may see Gasper Sells, MD or one of the following Advanced Practice Providers on your designated Care Team:    Melina Copa, PA-C  Ermalinda Barrios, PA-C    Other Instructions  [place] OFFICE [place], [place] [address] Dept: [phone] Loc: [phone]  TEKESHA ALMGREN  03/04/2020  You are scheduled for a Cardiac Catheterization on Tuesday, March 1 with Dr. Larae Grooms.  1. Please arrive at the [facility] (Main Entrance A) at [facility]: [address] [place],  [address] at 5:30 AM (This time is two  hours before your procedure to ensure your preparation). Free valet parking service is available.   Special note: Every effort is made to have your procedure done on time. Please understand that emergencies sometimes delay scheduled procedures.  2. Diet: Do not eat solid foods after midnight.  The patient may have clear liquids until 5am upon the day of the procedure.  3. Labs: You will need to have blood drawn: TODAY  4. Medication instructions in preparation for your procedure:   DO NOT TAKE LASIX ON THE MORNING OF HEART CATH    DO NOT TAKE Losartan DAY BEFORE( Feb. 28, 2022) and DAY of HEART CATH (March 11, 2020)    Only take 30 units of Lantus insulin the night prior to your procedure.  DO NOT TAKE ANY INSULIN ON THE DAY OF THE PROCEDURE.  DO NOT TAKE ANY DIABETIC MEDICATIONS ON THE MORNING OF PROCEDURE  Farxiga   Humalog  Victoza      On the morning of your procedure, take your Aspirin 81mg  and any morning medicines NOT listed above.  You may use sips of water.  5. Plan for one night stay--bring personal belongings. 6. Bring a current list of your medications and current insurance cards. 7. You MUST have a responsible person to drive you home. 8. Someone MUST be with you the first 24 hours after you arrive home or your discharge will be delayed. 9. Please wear clothes that are easy to get on and off and wear slip-on shoes.  Thank you for allowing [place] to care for you!   -- [facility]

## 2020-03-05 ENCOUNTER — Telehealth: Payer: Self-pay | Admitting: Internal Medicine

## 2020-03-05 NOTE — Telephone Encounter (Signed)
The patient has been notified of the result and verbalized understanding.  All questions (if any) were answered. Darrell Jewel, RN 03/05/2020 5:23 PM

## 2020-03-05 NOTE — Telephone Encounter (Signed)
Sarah [name] is returning Sarah [name]'s call in regards to her lab results. Please advise.

## 2020-03-08 ENCOUNTER — Other Ambulatory Visit (HOSPITAL_COMMUNITY)
Admission: RE | Admit: 2020-03-08 | Discharge: 2020-03-08 | Disposition: A | Discharge status: Home / Self Care | Payer: Medicare Other | Source: Ambulatory Visit | Attending: Interventional Cardiology | Admitting: Interventional Cardiology

## 2020-03-08 DIAGNOSIS — Z955 Presence of coronary angioplasty implant and graft: Secondary | ICD-10-CM

## 2020-03-08 DIAGNOSIS — Z20822 Contact with and (suspected) exposure to covid-19: Secondary | ICD-10-CM | POA: Insufficient documentation

## 2020-03-08 DIAGNOSIS — Z01812 Encounter for preprocedural laboratory examination: Secondary | ICD-10-CM | POA: Insufficient documentation

## 2020-03-08 LAB — SARS CORONAVIRUS 2 (TAT 6-24 HRS): SARS Coronavirus 2: NEGATIVE

## 2020-03-10 ENCOUNTER — Telehealth: Payer: Self-pay | Admitting: *Deleted

## 2020-03-10 NOTE — Progress Notes (Signed)
[facility] Summary Report / Loop Recorder 

## 2020-03-10 NOTE — Telephone Encounter (Signed)
Pt contacted pre-catheterization scheduled at [facility] for: Tuesday March 11, 2020 7:30 AM Verified arrival time and place: [facility]) at: 5:30 AM   No solid food after midnight prior to cath, clear liquids until 5 AM day of procedure.   Hold: Lasix-AM of procedure Victoza-AM of procedure Insulin-AM of procedure Farxiga-AM of procedure  Except hold medications AM meds can be  taken pre-cath with sips of water including: ASA 81 mg   Confirmed patient has responsible adult to drive home post procedure and be with patient first 24 hours after arriving home: yes  You are allowed ONE visitor in the waiting room during the time you are at the hospital for your procedure. Both you and your visitor must wear a mask once you enter the hospital.  Reviewed procedure/mask/visitor instructions with patient.

## 2020-03-11 ENCOUNTER — Ambulatory Visit (HOSPITAL_COMMUNITY)
Admission: RE | Disposition: A | Discharge status: Home / Self Care | Payer: Self-pay | Source: Home / Self Care | Attending: Interventional Cardiology

## 2020-03-11 ENCOUNTER — Other Ambulatory Visit: Payer: Self-pay

## 2020-03-11 ENCOUNTER — Ambulatory Visit (HOSPITAL_COMMUNITY)
Admission: RE | Admit: 2020-03-11 | Discharge: 2020-03-11 | Disposition: A | Discharge status: Home / Self Care | Payer: Medicare Other | Attending: Interventional Cardiology | Admitting: Interventional Cardiology

## 2020-03-11 DIAGNOSIS — R0609 Other forms of dyspnea: Secondary | ICD-10-CM | POA: Diagnosis not present

## 2020-03-11 DIAGNOSIS — Z79899 Other long term (current) drug therapy: Secondary | ICD-10-CM | POA: Insufficient documentation

## 2020-03-11 DIAGNOSIS — E119 Type 2 diabetes mellitus without complications: Secondary | ICD-10-CM | POA: Insufficient documentation

## 2020-03-11 DIAGNOSIS — I25118 Atherosclerotic heart disease of native coronary artery with other forms of angina pectoris: Secondary | ICD-10-CM | POA: Diagnosis not present

## 2020-03-11 DIAGNOSIS — Z87891 Personal history of nicotine dependence: Secondary | ICD-10-CM | POA: Diagnosis not present

## 2020-03-11 DIAGNOSIS — I6529 Occlusion and stenosis of unspecified carotid artery: Secondary | ICD-10-CM | POA: Insufficient documentation

## 2020-03-11 DIAGNOSIS — Z794 Long term (current) use of insulin: Secondary | ICD-10-CM | POA: Insufficient documentation

## 2020-03-11 DIAGNOSIS — I700 Atherosclerosis of aorta: Secondary | ICD-10-CM | POA: Diagnosis not present

## 2020-03-11 DIAGNOSIS — Z7902 Long term (current) use of antithrombotics/antiplatelets: Secondary | ICD-10-CM | POA: Insufficient documentation

## 2020-03-11 DIAGNOSIS — E782 Mixed hyperlipidemia: Secondary | ICD-10-CM | POA: Insufficient documentation

## 2020-03-11 DIAGNOSIS — I10 Essential (primary) hypertension: Secondary | ICD-10-CM | POA: Diagnosis not present

## 2020-03-11 DIAGNOSIS — Z955 Presence of coronary angioplasty implant and graft: Secondary | ICD-10-CM

## 2020-03-11 DIAGNOSIS — R0600 Dyspnea, unspecified: Secondary | ICD-10-CM

## 2020-03-11 DIAGNOSIS — R079 Chest pain, unspecified: Secondary | ICD-10-CM

## 2020-03-11 HISTORY — PX: CORONARY STENT INTERVENTION: CATH118234

## 2020-03-11 HISTORY — PX: CORONARY ULTRASOUND/IVUS: CATH118244

## 2020-03-11 HISTORY — PX: LEFT HEART CATH AND CORONARY ANGIOGRAPHY: CATH118249

## 2020-03-11 LAB — GLUCOSE, CAPILLARY: Glucose-Capillary: 192 mg/dL — ABNORMAL HIGH (ref 70–99)

## 2020-03-11 LAB — POCT ACTIVATED CLOTTING TIME
Activated Clotting Time: 321 seconds
Activated Clotting Time: 428 seconds

## 2020-03-11 SURGERY — LEFT HEART CATH AND CORONARY ANGIOGRAPHY
Anesthesia: LOCAL

## 2020-03-11 MED ORDER — LABETALOL HCL 5 MG/ML IV SOLN
10.0000 mg | INTRAVENOUS | Status: AC | PRN
Start: 2020-03-11 — End: 2020-03-11

## 2020-03-11 MED ORDER — CLOPIDOGREL BISULFATE 300 MG PO TABS
ORAL_TABLET | ORAL | Status: AC
  Filled 2020-03-11: qty 2

## 2020-03-11 MED ORDER — VERAPAMIL HCL 2.5 MG/ML IV SOLN
INTRAVENOUS | Status: AC
  Filled 2020-03-11: qty 2

## 2020-03-11 MED ORDER — SODIUM CHLORIDE 0.9 % WEIGHT BASED INFUSION
1.0000 mL/kg/h | INTRAVENOUS | Status: DC
  Administered 2020-03-11: 1 mL/kg/h via INTRAVENOUS

## 2020-03-11 MED ORDER — SODIUM CHLORIDE 0.9% FLUSH
3.0000 mL | INTRAVENOUS | Status: DC | PRN
Start: 2020-03-11 — End: 2020-03-11

## 2020-03-11 MED ORDER — ACETAMINOPHEN 325 MG PO TABS
650.0000 mg | ORAL_TABLET | ORAL | Status: DC | PRN
  Administered 2020-03-11: 650 mg via ORAL
  Filled 2020-03-11: qty 2

## 2020-03-11 MED ORDER — MIDAZOLAM HCL 2 MG/2ML IJ SOLN
INTRAMUSCULAR | Status: DC | PRN
  Administered 2020-03-11: 2 mg via INTRAVENOUS
  Administered 2020-03-11: 1 mg via INTRAVENOUS

## 2020-03-11 MED ORDER — SODIUM CHLORIDE 0.9 % WEIGHT BASED INFUSION
3.0000 mL/kg/h | INTRAVENOUS | Status: AC
  Administered 2020-03-11: 3 mL/kg/h via INTRAVENOUS

## 2020-03-11 MED ORDER — HEPARIN SODIUM (PORCINE) 1000 UNIT/ML IJ SOLN
INTRAMUSCULAR | Status: AC
  Filled 2020-03-11: qty 1

## 2020-03-11 MED ORDER — HEPARIN (PORCINE) IN NACL 1000-0.9 UT/500ML-% IV SOLN
INTRAVENOUS | Status: DC | PRN
  Administered 2020-03-11 (×2): 500 mL

## 2020-03-11 MED ORDER — SODIUM CHLORIDE 0.9 % IV SOLN: 250.0000 mL | INTRAVENOUS | Status: DC | PRN

## 2020-03-11 MED ORDER — SODIUM CHLORIDE 0.9% FLUSH: 3.0000 mL | Freq: Two times a day (BID) | INTRAVENOUS | Status: DC

## 2020-03-11 MED ORDER — LIDOCAINE HCL (PF) 1 % IJ SOLN
INTRAMUSCULAR | Status: AC
  Filled 2020-03-11: qty 30

## 2020-03-11 MED ORDER — HYDRALAZINE HCL 20 MG/ML IJ SOLN: 10.0000 mg | INTRAMUSCULAR | Status: AC | PRN

## 2020-03-11 MED ORDER — FENTANYL CITRATE (PF) 100 MCG/2ML IJ SOLN
INTRAMUSCULAR | Status: DC | PRN
  Administered 2020-03-11 (×2): 25 ug via INTRAVENOUS

## 2020-03-11 MED ORDER — FENTANYL CITRATE (PF) 100 MCG/2ML IJ SOLN
INTRAMUSCULAR | Status: AC
  Filled 2020-03-11: qty 2

## 2020-03-11 MED ORDER — SODIUM CHLORIDE 0.9% FLUSH: 3.0000 mL | INTRAVENOUS | Status: DC | PRN

## 2020-03-11 MED ORDER — MIDAZOLAM HCL 2 MG/2ML IJ SOLN
INTRAMUSCULAR | Status: AC
  Filled 2020-03-11: qty 2

## 2020-03-11 MED ORDER — NITROGLYCERIN 1 MG/10 ML FOR IR/CATH LAB
INTRA_ARTERIAL | Status: DC | PRN
  Administered 2020-03-11 (×2): 200 ug via INTRACORONARY
  Administered 2020-03-11: 400 mL via INTRA_ARTERIAL

## 2020-03-11 MED ORDER — ASPIRIN 81 MG PO CHEW: 81.0000 mg | CHEWABLE_TABLET | Freq: Every day | ORAL | Status: DC

## 2020-03-11 MED ORDER — NITROGLYCERIN 1 MG/10 ML FOR IR/CATH LAB
INTRA_ARTERIAL | Status: AC
  Filled 2020-03-11: qty 10

## 2020-03-11 MED ORDER — HEPARIN SODIUM (PORCINE) 1000 UNIT/ML IJ SOLN
INTRAMUSCULAR | Status: DC | PRN
  Administered 2020-03-11: 2000 [IU] via INTRAVENOUS
  Administered 2020-03-11: 3000 [IU] via INTRAVENOUS
  Administered 2020-03-11: 5000 [IU] via INTRAVENOUS

## 2020-03-11 MED ORDER — ONDANSETRON HCL 4 MG/2ML IJ SOLN
4.0000 mg | Freq: Four times a day (QID) | INTRAMUSCULAR | Status: DC | PRN
  Filled 2020-03-11: qty 2

## 2020-03-11 MED ORDER — FAMOTIDINE 20 MG PO TABS
20.0000 mg | ORAL_TABLET | Freq: Every day | ORAL | Status: DC
  Administered 2020-03-11: 20 mg via ORAL
  Filled 2020-03-11 (×2): qty 1

## 2020-03-11 MED ORDER — SODIUM CHLORIDE 0.9 % IV SOLN: INTRAVENOUS | Status: AC

## 2020-03-11 MED ORDER — HEPARIN (PORCINE) IN NACL 1000-0.9 UT/500ML-% IV SOLN
INTRAVENOUS | Status: AC
  Filled 2020-03-11: qty 1000

## 2020-03-11 MED ORDER — ASPIRIN 81 MG PO CHEW: 81.0000 mg | CHEWABLE_TABLET | ORAL | Status: DC

## 2020-03-11 MED ORDER — VERAPAMIL HCL 2.5 MG/ML IV SOLN
INTRAVENOUS | Status: DC | PRN
  Administered 2020-03-11: 10 mL via INTRA_ARTERIAL

## 2020-03-11 MED ORDER — LIDOCAINE HCL (PF) 1 % IJ SOLN
INTRAMUSCULAR | Status: DC | PRN
  Administered 2020-03-11: 2 mL

## 2020-03-11 MED ORDER — CLOPIDOGREL BISULFATE 75 MG PO TABS: 75.0000 mg | ORAL_TABLET | Freq: Every day | ORAL | Status: DC

## 2020-03-11 MED ORDER — IOHEXOL 350 MG/ML SOLN
INTRAVENOUS | Status: DC | PRN
  Administered 2020-03-11: 120 mL via INTRA_ARTERIAL

## 2020-03-11 MED ORDER — CLOPIDOGREL BISULFATE 300 MG PO TABS
ORAL_TABLET | ORAL | Status: DC | PRN
  Administered 2020-03-11: 600 mg via ORAL

## 2020-03-11 SURGICAL SUPPLY — 21 items
BALLN SAPPHIRE 2.5X20 (BALLOONS) ×2
BALLN SAPPHIRE ~~LOC~~ 3.75X15 (BALLOONS) ×1 IMPLANT
BALLOON SAPPHIRE 2.5X20 (BALLOONS) IMPLANT
CATH 5FR JL3.5 JR4 ANG PIG MP (CATHETERS) ×1 IMPLANT
CATH OPTICROSS HD (CATHETERS) ×1 IMPLANT
CATH VISTA GUIDE 6FR XBRCA (CATHETERS) ×1 IMPLANT
DEVICE RAD TR BAND REGULAR (VASCULAR PRODUCTS) ×2 IMPLANT
GLIDESHEATH SLEND SS 6F .021 (SHEATH) ×1 IMPLANT
GUIDEWIRE INQWIRE 1.5J.035X260 (WIRE) IMPLANT
INQWIRE 1.5J .035X260CM (WIRE) ×2
KIT ENCORE 26 ADVANTAGE (KITS) ×1 IMPLANT
KIT HEART LEFT (KITS) ×2 IMPLANT
KIT HEMO VALVE WATCHDOG (MISCELLANEOUS) ×1 IMPLANT
PACK CARDIAC CATHETERIZATION (CUSTOM PROCEDURE TRAY) ×2 IMPLANT
SHEATH PROBE COVER 6X72 (BAG) ×1 IMPLANT
SLED PULL BACK IVUS (MISCELLANEOUS) ×1 IMPLANT
STENT RESOLUTE ONYX 3.0X30 (Permanent Stent) ×1 IMPLANT
SYR MEDRAD MARK 7 150ML (SYRINGE) ×2 IMPLANT
TRANSDUCER W/STOPCOCK (MISCELLANEOUS) ×2 IMPLANT
TUBING CIL FLEX 10 FLL-RA (TUBING) ×2 IMPLANT
WIRE ASAHI PROWATER 180CM (WIRE) ×1 IMPLANT

## 2020-03-11 NOTE — Progress Notes (Signed)
Pt c/o left mid cheat pain states its burning rates as a 6. Ria Comment, [place] called and informed. New order noted

## 2020-03-11 NOTE — Progress Notes (Signed)
Vomited approx 300 cc of yellow liquid pt states she feels better now

## 2020-03-11 NOTE — Progress Notes (Signed)
Discharge instructions reviewed with pt and her sister (via telephone) both voice understanding.

## 2020-03-11 NOTE — Progress Notes (Deleted)
CARDIAC REHAB PHASE I   Discussed stent, Plavix, restrictions, diet, exercise, NTG and CRPII. Pt voiced understanding. She admits to drinking soda. Pilar Plate conversation regarding her uncontrolled DM. Also highly encouraged her to start walking/exercise. Will refer to [place]. Pt voiced understanding. [place]er, ACSM 03/11/2020 3:04 PM

## 2020-03-11 NOTE — Progress Notes (Signed)
States she feels better after she vomited.

## 2020-03-11 NOTE — Discharge Summary (Addendum)
Discharge Summary for Same Day PCI   Patient ID: Sarah [name] MRN: [id number]; DOB: 1945/07/03  Admit date: 03/11/2020 Discharge date: 03/11/2020  Primary Care Provider: [name], [name], MD  Primary Cardiologist: Werner Lean, MD  Primary Electrophysiologist:  None   Discharge Diagnoses    Active Problems:   Chest pain  CAD with angina HTN Hyperlipidemia DM2  Diagnostic Studies/Procedures    Cardiac Catheterization 03/11/2020:    Prox RCA lesion is 50% stenosed.  Mid RCA lesion is 90% stenosed.  A drug-eluting stent was successfully placed using a STENT RESOLUTE ONYX 3.0X30, post dilated to 3.75 mm, and optimized with IVUS.  Post intervention, there is a 0% residual stenosis.  Prox Cx lesion is 50% stenosed. This looks more significant in the caudal views. CT FFR of the circumflex was negative.  Mid Cx lesion is 25% stenosed.  Prox LAD lesion is 25% stenosed.  The left ventricular systolic function is normal.  LV end diastolic pressure is normal. LVEDP 14 mm Hg.  The left ventricular ejection fraction is 55-65% by visual estimate.  There is no aortic valve stenosis.   Successful PCI of the RCA.  Moderate disease in the proximal circumflex, if she had persistent symptoms, could reevaluate this vessel.  Severe bend in the LAD which may have affected CT FFR calculation.  I discussed the findings with the patient's sister.  Continue dual antiplatelet therapy along with aggressive secondary prevention.  Plan for same-day discharge.  Diagnostic Dominance: Right    Intervention     _____________   History of Present Illness     Sarah [name] is a 75 y.o. female with a hx of DM with HTN, Prior Stroke in 2018, Aortic atherosclerosis who presented for evaluation 02/12/20. In interim of that visit, patient had echocardiogram that was relatively benign and a positive CCTA for RCA disease.  Patient noted that she was doing the same since  last visit.  Since day last visit notes no changes   Relevant interval testing or therapy include CCTA showing mRCA disease and relatively normal echo.  There are no interval hospital/ED visit.  She had a dentist appointment 03/05/20 to discus long term treatment  Noted some chest pressure in her sternum that appears random and not affected by activity.  Notes no chagne in DOE but no PND/Orthopnea.  No weight gain, no change in her leg swelling.  No palpitations or syncope. Given CCTA findings cardiac catheterization was arranged for further evaluation.  Hospital Course     The patient underwent cardiac cath as noted above with successful PCI of the mid RCA of 90% stenosis with DES using IVUS guidance.  Does have 50% proximal RCA lesion, 50% proximal left circumflex lesion, 25% mid circumflex lesion and 25% proximal LAD lesion which will be treated medically.  Plan for DAPT with ASA/Plavix for at least 1 year. The patient was seen by cardiac rehab while in short stay. There were no observed complications post cath. Radial cath site was re-evaluated prior to discharge and found to be stable without any complications. Instructions/precautions regarding cath site care were given prior to discharge.  Sarah [name] was seen by Dr. Irish Lack and determined stable for discharge home. Follow up with our office has been arranged. Medications are listed below. Pertinent changes include n/a.  _____________  Cath/PCI Registry Performance & Quality Measures: 1. Aspirin prescribed? - Yes 2. ADP Receptor Inhibitor (Plavix/Clopidogrel, Brilinta/Ticagrelor or Effient/Prasugrel) prescribed (includes medically managed patients)? - Yes 3. High  Intensity Statin (Lipitor 40-80mg  or Crestor 20-40mg ) prescribed? - Yes 4. For EF <40%, was ACEI/ARB prescribed? - Not Applicable (EF >/= 54%) 5. For EF <40%, Aldosterone Antagonist (Spironolactone or Eplerenone) prescribed? - Not Applicable (EF >/= 56%) 6. Cardiac Rehab  Phase II ordered (Included Medically managed Patients)? - Yes  _____________   Discharge Vitals Blood pressure (!) 156/61, pulse 81, temperature 98.1 F (36.7 C), temperature source Oral, resp. rate 16, height 5' (1.524 m), weight 60.8 kg, SpO2 98 %.  Filed Weights   03/11/20 0526  Weight: 60.8 kg    Last Labs & Radiologic Studies    CBC No results for input(s): WBC, NEUTROABS, HGB, HCT, MCV, PLT in the last 72 hours. Basic Metabolic Panel No results for input(s): NA, K, CL, CO2, GLUCOSE, BUN, CREATININE, CALCIUM, MG, PHOS in the last 72 hours. Liver Function Tests No results for input(s): AST, ALT, ALKPHOS, BILITOT, PROT, ALBUMIN in the last 72 hours. No results for input(s): LIPASE, AMYLASE in the last 72 hours. High Sensitivity Troponin:   No results for input(s): TROPONINIHS in the last 720 hours.  BNP Invalid input(s): POCBNP D-Dimer No results for input(s): DDIMER in the last 72 hours. Hemoglobin A1C No results for input(s): HGBA1C in the last 72 hours. Fasting Lipid Panel No results for input(s): CHOL, HDL, LDLCALC, TRIG, CHOLHDL, LDLDIRECT in the last 72 hours. Thyroid Function Tests No results for input(s): TSH, T4TOTAL, T3FREE, THYROIDAB in the last 72 hours.  Invalid input(s): FREET3 _____________  CARDIAC CATHETERIZATION  Result Date: 03/11/2020  Prox RCA lesion is 50% stenosed.  Mid RCA lesion is 90% stenosed.  A drug-eluting stent was successfully placed using a STENT RESOLUTE ONYX 3.0X30, post dilated to 3.75 mm, and optimized with IVUS.  Post intervention, there is a 0% residual stenosis.  Prox Cx lesion is 50% stenosed. This looks more significant in the caudal views. CT FFR of the circumflex was negative.  Mid Cx lesion is 25% stenosed.  Prox LAD lesion is 25% stenosed.  The left ventricular systolic function is normal.  LV end diastolic pressure is normal. LVEDP 14 mm Hg.  The left ventricular ejection fraction is 55-65% by visual estimate.  There  is no aortic valve stenosis.  Successful PCI of the RCA.  Moderate disease in the proximal circumflex, if she had persistent symptoms, could reevaluate this vessel. Severe bend in the LAD which may have affected CT FFR calculation. I discussed the findings with the patient's sister.  Continue dual antiplatelet therapy along with aggressive secondary prevention.  Plan for same-day discharge.   CT CORONARY MORPH W/CTA COR W/SCORE W/CA W/CM &/OR WO/CM  Addendum Date: 02/28/2020   ADDENDUM REPORT: 02/28/2020 17:06 CLINICAL DATA:  75 year old female with chest pain. EXAM: Cardiac/Coronary  CTA TECHNIQUE: The patient was scanned on a [employer]. FINDINGS: A 120 kV prospective scan was triggered in the descending thoracic aorta at 111 HU's. Axial non-contrast 3 mm slices were carried out through the heart. The data set was analyzed on a dedicated work station and scored using the [place]. Gantry rotation speed was 250 msecs and collimation was .6 mm. Beta blockade and 0.8 mg of sl NTG was given. The 3D data set was reconstructed in 5% intervals of the 67-82 % of the R-R cycle. Diastolic phases were analyzed on a dedicated work station using MPR, MIP and VRT modes. The patient received 80 cc of contrast. Aorta: Normal size. Descending aortic atherosclerosis. No dissection. Aortic Valve:  Trileaflet.  Mild focal calcification. Coronary Arteries:  Normal coronary origin.  Right dominance. RCA is a large dominant artery that gives rise to PDA and PLA. There is mid vessel sequential soft plaque, 75-99% stenosis. Left main is a large artery that gives rise to LAD and LCX arteries. LAD is a large vessel that has spotty calcification in the proximal segment 0-24%, calcified plaque. LCX is a non-dominant artery that gives rise to one large OM1 branch. There is proximal soft plaque 50% with a secondary mid lesion 50-69%. Other findings: Normal pulmonary vein drainage into the left atrium. Normal left atrial  appendage without a thrombus. Normal size of the pulmonary artery. Please see radiology report for non cardiac findings. IMPRESSION: 1. Coronary calcium score of 25. This was 42 percentile for age and sex matched control. 2. Normal coronary origin with right dominance. 3. Multivessel CAD - mild LAD disease, moderate to severe stenosis of Circumflex and RCA. Sending for FFR analysis. 4.  Aortic atherosclerosis descending. Candee Furbish, MD [facility] Electronically Signed   By: Candee Furbish MD   On: 02/28/2020 17:06   Result Date: 02/28/2020 EXAM: OVER-READ INTERPRETATION  CT CHEST The following report is an over-read performed by radiologist Dr. Rolm Baptise of [facility], [place] on 02/28/2020. This over-read does not include interpretation of cardiac or coronary anatomy or pathology. The coronary CTA interpretation by the cardiologist is attached. COMPARISON:  09/26/2011 FINDINGS: Vascular: Scattered aortic calcifications. No evidence of aortic aneurysm. Heart is normal size. Mediastinum/Nodes: No adenopathy Lungs/Pleura: No confluent opacities or effusions. Upper Abdomen: Imaging into the upper abdomen demonstrates no acute findings. Musculoskeletal: Chest wall soft tissues are unremarkable. No acute bony abnormality. IMPRESSION: No acute extra cardiac abnormality. Aortic atherosclerosis. Electronically Signed: By: Rolm Baptise M.D. On: 02/28/2020 15:58   CT CORONARY FRACTIONAL FLOW RESERVE DATA PREP  Result Date: 02/29/2020 EXAM: FFRCT ANALYSIS 75 year old with abnormal coronary CT FINDINGS: FFRct analysis was performed on the original cardiac CT angiogram dataset. Diagrammatic representation of the FFRct analysis is provided in a separate PDF document in PACS. This dictation was created using the PDF document and an interactive 3D model of the results. 3D model is not available in the EMR/PACS. Normal FFR range is >0.80. 1. Left Main: Normal 2. LAD: Proximal - 0.95, Mid - 0.87, Distal 0.71 - Abnormal 3. LCX:  Proximal - 0.98, Mid 0.89, Distal 0.84 - Normal 4. Ramus: n/a 5. RCA: Proximal 0.99, Mid 0.51, Distal <0.50 - Abnormal IMPRESSION: 1.  Abnormal FFR of RCA verifying severe stenosis in mid segment. 2. Abnormal FFR of Distal LAD (may represent smaller caliber distal vessel). 3.  Recommend cardiac catheterization. Candee Furbish, MD [facility]. Note: These examples are not recommendations of [employer] and only provided as examples of what other customers are doing. Electronically Signed   By: Candee Furbish MD   On: 02/29/2020 07:05   CUP PACEART REMOTE DEVICE CHECK  Result Date: 03/03/2020 ILR summary report received. Battery status OK. Normal device function. No new symptom, tachy, brady, or pause episodes. No new AF episodes. Monthly summary reports and ROV/PRN   Disposition   Pt is being discharged home today in good condition.  Follow-up Plans & Appointments     Follow-up Information    Werner Lean, MD Follow up on 03/20/2020.   Specialty: Cardiology Why: at 2:40pm for your follow up appt Contact information: [address] [place] [address] [phone]  Discharge Instructions    AMB Referral to Cardiac Rehabilitation - Phase II   Complete by: As directed    Diagnosis: Coronary Stents   After initial evaluation and assessments completed: Virtual Based Care may be provided alone or in conjunction with Phase 2 Cardiac Rehab based on patient barriers.: Yes       Discharge Medications   Allergies as of 03/11/2020   No Known Allergies     Medication List    STOP taking these medications   omeprazole 20 MG capsule Commonly known as: PRILOSEC     TAKE these medications   acetaminophen 500 MG tablet Commonly known as: TYLENOL Take 1,000 mg by mouth every 6 (six) hours as needed for mild pain or headache.   amLODipine 10 MG tablet Commonly known as: NORVASC Take 10 mg by mouth daily.   aspirin EC 81 MG tablet Swallow whole. TAKE daily  until after heart Cath What changed:   how much to take  how to take this  when to take this  additional instructions   atorvastatin 80 MG tablet Commonly known as: LIPITOR Take 1 tablet (80 mg total) by mouth daily at 6 PM.   calcium carbonate 750 MG chewable tablet Commonly known as: TUMS EX Chew 1 tablet by mouth as needed for heartburn.   clopidogrel 75 MG tablet Commonly known as: PLAVIX TAKE 1 TABLET BY MOUTH DAILY   dapagliflozin propanediol 5 MG Tabs tablet Commonly known as: Farxiga Take 1 tablet (5 mg total) by mouth daily before breakfast.   Dexcom G6 Sensor Misc 1 Device by Does not apply route as directed.   Dexcom G6 Transmitter Misc 1 Device by Does not apply route as directed.   diclofenac Sodium 1 % Gel Commonly known as: Voltaren Apply 2 g topically 4 (four) times daily. What changed:   when to take this  reasons to take this   ezetimibe 10 MG tablet Commonly known as: Zetia Take 1 tablet (10 mg total) by mouth daily.   furosemide 20 MG tablet Commonly known as: LASIX TAKE 1 TABLET(20 MG) BY MOUTH DAILY What changed: See the new instructions.   HumaLOG KwikPen 200 UNIT/ML KwikPen Generic drug: insulin lispro Inject 18 Units into the skin with breakfast, with lunch, and with evening meal. 15 U 15 min before meals What changed:   when to take this  additional instructions   Insulin Pen Needle 32G X 4 MM Misc 1 Device by Does not apply route in the morning, at noon, in the evening, and at bedtime.   Lantus SoloStar 100 UNIT/ML Solostar Pen Generic drug: insulin glargine Inject 60 Units into the skin daily.   losartan 50 MG tablet Commonly known as: COZAAR TAKE 1 TABLET(50 MG) BY MOUTH DAILY What changed: See the new instructions.   metoprolol tartrate 25 MG tablet Commonly known as: LOPRESSOR Take 0.5 tablets (12.5 mg total) by mouth 2 (two) times daily.   nitroGLYCERIN 0.4 MG SL tablet Commonly known as: NITROSTAT Place 1  tablet (0.4 mg total) under the tongue every 5 (five) minutes as needed for chest pain. one tab every 5 minutes up to 3 tablets total over 15 minutes.   ONE TOUCH ULTRA TEST test strip Generic drug: glucose blood Check BS before and 2 hours after meals.   OT ULTRA/FASTTK CNTRL SOLN Soln   Pharmacist Choice Alcohol 70 % Misc Generic drug: Isopropyl Alcohol   Rocklatan 0.02-0.005 % Soln Generic drug: Netarsudil-Latanoprost Place 1 drop into both eyes  every evening.   sertraline 25 MG tablet Commonly known as: Zoloft Take 1 tablet (25 mg total) by mouth daily.   Simple Diagnostics Lancing Dev Misc Check BS before and 2 hours after meals.   Victoza 18 MG/3ML Sopn Generic drug: liraglutide Inject 1.8 mg into the skin daily. ADMINISTER 1.8 MG UNDER THE SKIN DAILY What changed: additional instructions          Allergies No Known Allergies  Outstanding Labs/Studies   N/a   Duration of Discharge Encounter   Greater than 30 minutes including physician time.  Signed, Reino Bellis, NP 03/11/2020, 2:36 PM   I have examined the patient and reviewed assessment and plan and discussed with patient.  Agree with above as stated.  Right radial site stable without hematoma.  2+ right radial pulse present.  Continue aggressive secondary prevention.  Medical therapy for moderate circumflex disease.  If she has refractory symptoms, could consider further evaluation of the circumflex.  Severe bend in the mid LAD which may have affected CT FFR.   She has been on clopidogrel in the past.  She should resume this medicine.  I stressed the importance of the medicine with the patient and her sister.   Larae Grooms

## 2020-03-11 NOTE — Progress Notes (Signed)
Arm board applied to right wrist area

## 2020-03-11 NOTE — Progress Notes (Signed)
States she feels much better no further c/o pain or nausea.

## 2020-03-11 NOTE — Interval H&P Note (Signed)
Cath Lab Visit (complete for each Cath Lab visit)  Clinical Evaluation Leading to the Procedure:   ACS: No.  Non-ACS:    Anginal Classification: CCS III  Anti-ischemic medical therapy: Minimal Therapy (1 class of medications)  Non-Invasive Test Results: No non-invasive testing performed  Prior CABG: No previous CABG      History and Physical Interval Note:  03/11/2020 7:36 AM  [name]  has presented today for surgery, with the diagnosis of multi vesel disease.  The various methods of treatment have been discussed with the patient and family. After consideration of risks, benefits and other options for treatment, the patient has consented to  Procedure(s): LEFT HEART CATH AND CORONARY ANGIOGRAPHY (N/A) as a surgical intervention.  The patient's history has been reviewed, patient examined, no change in status, stable for surgery.  I have reviewed the patient's chart and labs.  Questions were answered to the patient's satisfaction.     Larae Grooms

## 2020-03-11 NOTE — Discharge Instructions (Signed)
Radial Site Care  This sheet gives you information about how to care for yourself after your procedure. Your health care provider may also give you more specific instructions. If you have problems or questions, contact your health care provider. What can I expect after the procedure? After the procedure, it is common to have:  Bruising and tenderness at the catheter insertion area. Follow these instructions at home: Medicines  Take over-the-counter and prescription medicines only as told by your health care provider. Insertion site care 1. Follow instructions from your health care provider about how to take care of your insertion site. Make sure you: ? Wash your hands with soap and water before you remove your bandage (dressing). If soap and water are not available, use hand sanitizer. ? May remove dressing in 24 hours. 2. Check your insertion site every day for signs of infection. Check for: ? Redness, swelling, or pain. ? Fluid or blood. ? Pus or a bad smell. ? Warmth. 3. Do no take baths, swim, or use a hot tub for 5 days. 4. You may shower 24-48 hours after the procedure. ? Remove the dressing and gently wash the site with plain soap and water. ? Pat the area dry with a clean towel. ? Do not rub the site. That could cause bleeding. 5. Do not apply powder or lotion to the site. Activity  1. For 24 hours after the procedure, or as directed by your health care provider: ? Do not flex or bend the affected arm. ? Do not push or pull heavy objects with the affected arm. ? Do not drive yourself home from the hospital or clinic. You may drive 24 hours after the procedure. ? Do not operate machinery or power tools. ? KEEP ARM ELEVATED THE REMAINDER OF THE DAY. 2. Do not push, pull or lift anything that is heavier than 10 lb for 5 days. 3. Ask your health care provider when it is okay to: ? Return to work or school. ? Resume usual physical activities or sports. ? Resume sexual  activity. General instructions  If the catheter site starts to bleed, raise your arm and put firm pressure on the site. If the bleeding does not stop, get help right away. This is a medical emergency.  DRINK PLENTY OF FLUIDS FOR THE NEXT 2-3 DAYS.  No alcohol consumption for 24 hours after receiving sedation.  If you went home on the same day as your procedure, a responsible adult should be with you for the first 24 hours after you arrive home.  Keep all follow-up visits as told by your health care provider. This is important. Contact a health care provider if:  You have a fever.  You have redness, swelling, or yellow drainage around your insertion site. Get help right away if:  You have unusual pain at the radial site.  The catheter insertion area swells very fast.  The insertion area is bleeding, and the bleeding does not stop when you hold steady pressure on the area.  Your arm or hand becomes pale, cool, tingly, or numb. These symptoms may represent a serious problem that is an emergency. Do not wait to see if the symptoms will go away. Get medical help right away. Call your local emergency services (911 in the [place] Do not drive yourself to the hospital. Summary  After the procedure, it is common to have bruising and tenderness at the site.  Follow instructions from your health care provider about how to take care   of your radial site wound. Check the wound every day for signs of infection.  This information is not intended to replace advice given to you by your health care provider. Make sure you discuss any questions you have with your health care provider. Document Revised: 02/02/2017 Document Reviewed: 02/02/2017 Elsevier Patient Education  2020 Elsevier Inc[employer] 

## 2020-03-11 NOTE — Progress Notes (Signed)
Ria Comment, [place] and rehab nurse in to see pt.

## 2020-03-11 NOTE — Progress Notes (Signed)
Discussed stent, restrictions, Plavix, diet, exercise, NTG and CRPII. Pt voiced understanding. Pilar Plate discussion of the impacts of sugared drinks on her uncontrolled DM. Also highly encouraged exercise/walking. Will refer to [place].  [place], ACSM 3:15 PM 03/11/2020

## 2020-03-12 ENCOUNTER — Encounter (HOSPITAL_COMMUNITY): Payer: Self-pay | Admitting: Interventional Cardiology

## 2020-03-12 ENCOUNTER — Other Ambulatory Visit: Payer: Self-pay | Admitting: Family Medicine

## 2020-03-12 ENCOUNTER — Other Ambulatory Visit: Payer: Self-pay

## 2020-03-12 DIAGNOSIS — E1121 Type 2 diabetes mellitus with diabetic nephropathy: Secondary | ICD-10-CM

## 2020-03-12 MED ORDER — CLOPIDOGREL BISULFATE 75 MG PO TABS: 75.0000 mg | ORAL_TABLET | Freq: Every day | ORAL | 1 refills | Status: DC

## 2020-03-12 MED FILL — Heparin Sod (Porcine)-NaCl IV Soln 1000 Unit/500ML-0.9%: INTRAVENOUS | Qty: 500 | Status: AC

## 2020-03-12 NOTE — Progress Notes (Signed)
Spoke with pt states she knows she is to take Plavix daily she has some at home.

## 2020-03-13 ENCOUNTER — Ambulatory Visit: Payer: Medicare Other | Admitting: Dietician

## 2020-03-17 ENCOUNTER — Telehealth (HOSPITAL_COMMUNITY): Payer: Self-pay

## 2020-03-17 NOTE — Telephone Encounter (Signed)
Pt insurance is active and benefits verified through [facility] Medicare Co-pay 0, DED 0/0 met, out of pocket $4,500/$267.68 met, co-insurance 0% . no pre-authorization required. Passport, 03/17/2020'@4' :04pm, REF# [id number]  Will contact patient to see if she is interested in the Cardiac Rehab Program. If interested, patient will need to complete follow up appt. Once completed, patient will be contacted for scheduling upon review by the RN Navigator.

## 2020-03-19 ENCOUNTER — Ambulatory Visit: Payer: Medicare Other

## 2020-03-20 ENCOUNTER — Ambulatory Visit: Payer: Medicare Other | Admitting: Internal Medicine

## 2020-03-20 ENCOUNTER — Other Ambulatory Visit: Payer: Self-pay

## 2020-03-20 ENCOUNTER — Encounter: Payer: Self-pay | Admitting: Internal Medicine

## 2020-03-20 VITALS — BP 138/68 | HR 79 | Ht 60.0 in | Wt 132.0 lb

## 2020-03-20 DIAGNOSIS — R0989 Other specified symptoms and signs involving the circulatory and respiratory systems: Secondary | ICD-10-CM

## 2020-03-20 DIAGNOSIS — I10 Essential (primary) hypertension: Secondary | ICD-10-CM

## 2020-03-20 DIAGNOSIS — I700 Atherosclerosis of aorta: Secondary | ICD-10-CM | POA: Diagnosis not present

## 2020-03-20 DIAGNOSIS — I25118 Atherosclerotic heart disease of native coronary artery with other forms of angina pectoris: Secondary | ICD-10-CM

## 2020-03-20 DIAGNOSIS — E119 Type 2 diabetes mellitus without complications: Secondary | ICD-10-CM | POA: Diagnosis not present

## 2020-03-20 NOTE — Progress Notes (Signed)
Cardiology Office Note:    Date:  03/20/2020   ID:  Sarah [name], DOB [birth year], MRN [id number]  PCP:  [name], [name], MD  [facility] Cardiologist:  Rudean Haskell MD [place] Electrophysiologist:  None   CC: Follow up Cath  History of Present Illness:    Sarah [name] is a 75 y.o. female with a hx of DM with HTN, Prior Stroke in 2018, Aortic atherosclerosis who presents for evaluation 02/12/20. In interim of this visit, patient had echocardiogram that was relatively benign and a positive CCTA for RCA disease. Seen 2/22.  In interim of this visit, patient had LHC and PCI.  Seen 03/20/20.  Patient notes that she is doing OK.  Since last visit notes feeling a bit tired after cath.  Relevant interval testing or therapy include PCI.  There are no interval hospital/ED visit.    No chest pain or pressure or tightness going up the stairs.  No SOB/DOE  Post PCI.  No weight gain or leg swelling.  Notes rare heart flutters but no syncope .  Had cheese and broccoli soup today.  Ambulatory blood pressure SBP 120-130.   Past Medical History:  Diagnosis Date  . Bilateral carpal tunnel syndrome 03/27/2019  . Boils   . Glaucoma   . Heart murmur   . HTN (hypertension)   . Hx of adenomatous colonic polyps   . Hyperlipidemia   . Osteoporosis   . Pneumonia   . Stroke ([place])   . Type II or unspecified type diabetes mellitus without mention of complication, uncontrolled     Past Surgical History:  Procedure Laterality Date  . ABDOMINAL HYSTERECTOMY    . CARPAL TUNNEL RELEASE     right  . COLONOSCOPY  12-10-10   per Dr. Deatra Ina, clear, repeat in 7 yrs   . CORONARY STENT INTERVENTION N/A 03/11/2020   Procedure: CORONARY STENT INTERVENTION;  Surgeon: Jettie Booze, MD;  Location: [place] [facility];  Service: Cardiovascular;  Laterality: N/A;  . INCISION AND DRAINAGE PERIRECTAL ABSCESS N/A 09/26/2015   Procedure: IRRIGATION AND DEBRIDEMENT PERIRECTAL ABSCESS;   Surgeon: Mickeal Skinner, MD;  Location: [place];  Service: General;  Laterality: N/A;  . INTRAVASCULAR ULTRASOUND/IVUS N/A 03/11/2020   Procedure: Intravascular Ultrasound/IVUS;  Surgeon: Jettie Booze, MD;  Location: [place] [facility];  Service: Cardiovascular;  Laterality: N/A;  . KNEE ARTHROSCOPY     right  . LEFT HEART CATH AND CORONARY ANGIOGRAPHY N/A 03/11/2020   Procedure: LEFT HEART CATH AND CORONARY ANGIOGRAPHY;  Surgeon: Jettie Booze, MD;  Location: [place] [facility];  Service: Cardiovascular;  Laterality: N/A;  . LOOP RECORDER INSERTION N/A 10/19/2016   Procedure: LOOP RECORDER INSERTION;  Surgeon: Thompson Grayer, MD;  Location: [place] [facility];  Service: Cardiovascular;  Laterality: N/A;  . ORIF ANKLE FRACTURE Right 03/24/2012   Procedure: OPEN REDUCTION INTERNAL FIXATION (ORIF) ANKLE FRACTURE;  Surgeon: Newt Minion, MD;  Location: [place];  Service: Orthopedics;  Laterality: Right;  Open Reduction Internal Fixation Right Bimalleolar ankle fracture  . POLYPECTOMY    . TEE WITHOUT CARDIOVERSION N/A 10/18/2016   Procedure: TRANSESOPHAGEAL ECHOCARDIOGRAM (TEE);  Surgeon: Fay Records, MD;  Location[facility];  Service: Cardiovascular;  Laterality: N/A;  . TONSILLECTOMY      Current Medications: Current Meds  Medication Sig  . acetaminophen (TYLENOL) 500 MG tablet Take 1,000 mg by mouth every 6 (six) hours as needed for mild pain or headache.  Marland Kitchen amLODipine (  NORVASC) 10 MG tablet Take 10 mg by mouth daily.  Marland Kitchen aspirin EC 81 MG tablet Swallow whole. TAKE daily until after heart Cath (Patient taking differently: Take 81 mg by mouth daily.)  . atorvastatin (LIPITOR) 80 MG tablet Take 1 tablet (80 mg total) by mouth daily at 6 PM.  . Blood Glucose Calibration (OT ULTRA/FASTTK CNTRL SOLN) SOLN   . calcium carbonate (TUMS EX) 750 MG chewable tablet Chew 1 tablet by mouth as needed for heartburn.  . clopidogrel (PLAVIX) 75 MG tablet Take 1 tablet (75 mg total) by mouth  daily.  . Continuous Blood Gluc Sensor (DEXCOM G6 SENSOR) MISC 1 Device by Does not apply route as directed.  . Continuous Blood Gluc Transmit (DEXCOM G6 TRANSMITTER) MISC 1 Device by Does not apply route as directed.  . dapagliflozin propanediol (FARXIGA) 5 MG TABS tablet Take 1 tablet (5 mg total) by mouth daily before breakfast.  . diclofenac Sodium (VOLTAREN) 1 % GEL Apply 2 g topically 4 (four) times daily. (Patient taking differently: Apply 2 g topically 4 (four) times daily as needed (pain).)  . ezetimibe (ZETIA) 10 MG tablet Take 1 tablet (10 mg total) by mouth daily.  . furosemide (LASIX) 20 MG tablet TAKE 1 TABLET(20 MG) BY MOUTH DAILY  . insulin glargine (LANTUS SOLOSTAR) 100 UNIT/ML Solostar Pen Inject 60 Units into the skin daily.  . insulin lispro (HUMALOG KWIKPEN) 200 UNIT/ML KwikPen Inject 18 Units into the skin with breakfast, with lunch, and with evening meal. 15 U 15 min before meals  . Insulin Pen Needle 32G X 4 MM MISC 1 Device by Does not apply route in the morning, at noon, in the evening, and at bedtime.  Elmore Guise Devices (SIMPLE DIAGNOSTICS LANCING DEV) MISC Check BS before and 2 hours after meals.  . liraglutide (VICTOZA) 18 MG/3ML SOPN Inject 1.8 mg into the skin daily. ADMINISTER 1.8 MG UNDER THE SKIN DAILY  . losartan (COZAAR) 50 MG tablet TAKE 1 TABLET(50 MG) BY MOUTH DAILY  . metoprolol tartrate (LOPRESSOR) 25 MG tablet Take 0.5 tablets (12.5 mg total) by mouth 2 (two) times daily.  . nitroGLYCERIN (NITROSTAT) 0.4 MG SL tablet Place 1 tablet (0.4 mg total) under the tongue every 5 (five) minutes as needed for chest pain. one tab every 5 minutes up to 3 tablets total over 15 minutes.  . ONE TOUCH ULTRA TEST test strip Check BS before and 2 hours after meals.  Marland Kitchen PHARMACIST CHOICE ALCOHOL 70 % PADS   . ROCKLATAN 0.02-0.005 % SOLN Place 1 drop into both eyes every evening.  . sertraline (ZOLOFT) 25 MG tablet Take 1 tablet (25 mg total) by mouth daily.     Allergies:    Patient has no known allergies.   Social History   Socioeconomic History  . Marital status: Single    Spouse name: Not on file  . Number of children: Not on file  . Years of education: Not on file  . Highest education level: Not on file  Occupational History  . Not on file  Tobacco Use  . Smoking status: Former Smoker    Types: Cigarettes    Quit date: 10/15/2016    Years since quitting: 3.4  . Smokeless tobacco: Never Used  . Tobacco comment: smokes occ.   Vaping Use  . Vaping Use: Never used  Substance and Sexual Activity  . Alcohol use: Yes    Alcohol/week: 0.0 standard drinks    Comment: occ  . Drug use: No  .  Sexual activity: Not on file  Other Topics Concern  . Not on file  Social History Narrative  . Not on file   Social Determinants of Health   Financial Resource Strain: Not on file  Food Insecurity: No Food Insecurity  . Worried About Charity fundraiser in the Last Year: Never true  . Ran Out of Food in the Last Year: Never true  Transportation Needs: Not on file  Physical Activity: Not on file  Stress: Stress Concern Present  . Feeling of Stress : To some extent  Social Connections: Not on file    Family History: The patient's family history includes Diabetes in her mother; Hypertension in her mother; Stroke in her father, maternal uncle, and paternal uncle. There is no history of Colon cancer, Esophageal cancer, Rectal cancer, or Stomach cancer. History of coronary artery disease notable for no members. History of heart failure notable for father. History of arrhythmia notable for no members.  ROS:   Please see the history of present illness.     All other systems reviewed and are negative.  EKGs/Labs/Other Studies Reviewed:    The following studies were reviewed today:  EKG:   03/04/20: SR rate 78 nonspecific TW flattening 02/12/2020: SR rate 81 WNL  LINQ monitor: Date: 01/31/20 Results: No AF in many reports reviewed  Transthoracic  Echocardiogram: Date: 02/09/2018 Results: 1. The left ventricle has normal systolic function of 00-93%. The cavity  size is normal. There is no left ventricular wall thickness. Echo evidence  of impaired relaxation diastolic filling patterns. Indeterminent filling  pressures.  2. Normal left atrial size.  3. Normal right atrial size.  4. Normal tricuspid valve.  5. The aortic valve normal. There is mild thickening and mild  calcification of the aortic valve.  6. No atrial level shunt detected by color flow Doppler.   Transesophageal Echocardiogram: Date: 10/19/2015 Results: Mild fixed plaquing of the thoracic aorta no thrombus  Cardiac CT: Date:   Results: Aortic Atherosclerosis IMPRESSION: 1. Coronary calcium score of 25. This was 29 percentile for age and sex matched control.  2. Normal coronary origin with right dominance.  3. Multivessel CAD - mild LAD disease, moderate to severe stenosis of Circumflex and RCA. Sending for FFR analysis.  4.  Aortic atherosclerosis descending. FFR 1. Left Main: Normal  2. LAD: Proximal - 0.95, Mid - 0.87, Distal 0.71 - Abnormal  3. LCX: Proximal - 0.98, Mid 0.89, Distal 0.84 - Normal  4. Ramus: n/a  5. RCA: Proximal 0.99, Mid 0.51, Distal <0.50 - Abnormal  Left/Right Heart Catheterizations: Date: 03/11/20 Results:  Prox RCA lesion is 50% stenosed.  Mid RCA lesion is 90% stenosed.  A drug-eluting stent was successfully placed using a STENT RESOLUTE ONYX 3.0X30, post dilated to 3.75 mm, and optimized with IVUS.  Post intervention, there is a 0% residual stenosis.  Prox Cx lesion is 50% stenosed. This looks more significant in the caudal views. CT FFR of the circumflex was negative.  Mid Cx lesion is 25% stenosed.  Prox LAD lesion is 25% stenosed.  The left ventricular systolic function is normal.  LV end diastolic pressure is normal. LVEDP 14 mm Hg.  The left ventricular ejection fraction is 55-65% by visual  estimate.  There is no aortic valve stenosis.   Successful PCI of the RCA.  Moderate disease in the proximal circumflex, if she had persistent symptoms, could reevaluate this vessel.  Severe bend in the LAD which may have affected CT FFR calculation.  I discussed the findings with the patient's sister.  Continue dual antiplatelet therapy along with aggressive secondary prevention.  Plan for same-day discharge.   Recent Labs: 08/01/2019: ALT 9; TSH 1.67 10/24/2019: Brain Natriuretic Peptide 72 01/21/2020: Magnesium 1.9 02/12/2020: NT-Pro BNP 132 03/04/2020: BUN 16; Creatinine, Ser 0.65; Hemoglobin 12.1; Platelets 342; Potassium 4.2; Sodium 137  Recent Lipid Panel    Component Value Date/Time   CHOL 203 (H) 04/27/2019 1100   TRIG 114.0 04/27/2019 1100   HDL 65.10 04/27/2019 1100   CHOLHDL 3 04/27/2019 1100   VLDL 22.8 04/27/2019 1100   LDLCALC 115 (H) 04/27/2019 1100    Risk Assessment/Calculations:     N/A  Physical Exam:    VS:  BP 138/68   Pulse 79   Ht 5' (1.524 m)   Wt 132 lb (59.9 kg)   SpO2 97%   BMI 25.78 kg/m     Wt Readings from Last 3 Encounters:  03/20/20 132 lb (59.9 kg)  03/11/20 134 lb (60.8 kg)  03/04/20 136 lb (61.7 kg)   GEN:  Well nourished, well developed in no acute distress HEENT: Normal NECK: No JVD; No carotid bruits LYMPHATICS: No lymphadenopathy CARDIAC: RRR, no murmurs, rubs, gallops, faint bruit over R radial site  RESPIRATORY:  Clear to auscultation without rales, wheezing or rhonchi  ABDOMEN: Soft, non-tender, non-distended MUSCULOSKELETAL:  Trace non-pitting bilaterally; No deformity  SKIN: Warm and dry NEUROLOGIC:  Alert and oriented x 3 PSYCHIATRIC:  Normal affect   ASSESSMENT:    1. Coronary artery disease of native artery of native heart with stable angina pectoris ([place])   2. Aortic atherosclerosis ([place])   3. Diabetes mellitus with coincident hypertension ([place])   4. Bruit of large artery    PLAN:    In order of problems  listed above:  Coronary Artery Disease; Obstructive Prior stroke on plavix - asymptomatic  - anatomy: mRCA +/- LCx - continue DAPT for ASA and plavix for 6 months to one year - Continue metoprolol 12.5 mg PO BID  - continue nitrates; PRN - R Radial Duplex for R arm bruit  Hyperlipidemia (mixed) Aortic Atherosclerosis -LDL goal less than 70 - repeat lipids and lfts fasting next week - will consider lipid clinic referral based on results -continue current statin and Zetia - gave education on dietary changes  Diabetes with Hypertension LE Edema - ambulatory blood pressure at goal,  will continue ambulatory BP monitoring; gave education on how to perform ambulatory blood pressure monitoring including the frequency and technique; goal ambulatory blood pressure < 135/85 on average - continue home medications  - discussed diet (DASH/low sodium), and exercise/weight loss interventions - low threshold to increase her lasix if leg swelling persists or worsens (has improved on 20 mg PO daily)  Six months follow up unless new symptoms or abnormal test results warranting change in plan  Would be reasonable for  APP Follow up    Medication Adjustments/Labs and Tests Ordered: Current medicines are reviewed at length with the patient today.  Concerns regarding medicines are outlined above.  Orders Placed This Encounter  Procedures  . Lipid panel  . VAS [place] LOWER EXTREMITY ARTERIAL DUPLEX   No orders of the defined types were placed in this encounter.   Patient Instructions  Medication Instructions:  Your physician recommends that you continue on your current medications as directed. Please refer to the Current Medication list given to you today.  *If you need a refill on your cardiac medications before your next  appointment, please call your pharmacy*   Lab Work: Next WEEK: Fasting lipid panel If you have labs (blood work) drawn today and your tests are completely normal, you will  receive your results only by: Marland Kitchen MyChart Message (if you have MyChart) OR . A paper copy in the mail If you have any lab test that is abnormal or we need to change your treatment, we will call you to review the results.   Testing/Procedures: Your physician has requested that you have a  upper right extremity arterial duplex. This test is an ultrasound of the arteries in the right arm. It looks at arterial blood flow in the right arm. Allow one hour for Upper Arterial scans. There are no restrictions or special instructions    Follow-Up: At [facility], you and your health needs are our priority.  As part of our continuing mission to provide you with exceptional heart care, we have created designated Provider Care Teams.  These Care Teams include your primary Cardiologist (physician) and Advanced Practice Providers (APPs -  Physician Assistants and Nurse Practitioners) who all work together to provide you with the care you need, when you need it.  We recommend signing up for the patient portal called "MyChart".  Sign up information is provided on this After Visit Summary.  MyChart is used to connect with patients for Virtual Visits (Telemedicine).  Patients are able to view lab/test results, encounter notes, upcoming appointments, etc.  Non-urgent messages can be sent to your provider as well.   To learn more about what you can do with MyChart, go to [email or web address].    Your next appointment:   6 month(s)  The format for your next appointment:   In Person  Provider:   You may see Werner Lean, MD or one of the following Advanced Practice Providers on your designated Care Team:    Melina Copa, PA-C  Ermalinda Barrios, PA-C          Signed, Werner Lean, MD  03/20/2020 3:28 PM    [place] \

## 2020-03-20 NOTE — Patient Instructions (Signed)
Medication Instructions:  Your physician recommends that you continue on your current medications as directed. Please refer to the Current Medication list given to you today.  *If you need a refill on your cardiac medications before your next appointment, please call your pharmacy*   Lab Work: Next WEEK: Fasting lipid panel If you have labs (blood work) drawn today and your tests are completely normal, you will receive your results only by: Marland Kitchen MyChart Message (if you have MyChart) OR . A paper copy in the mail If you have any lab test that is abnormal or we need to change your treatment, we will call you to review the results.   Testing/Procedures: Your physician has requested that you have a  upper right extremity arterial duplex. This test is an ultrasound of the arteries in the right arm. It looks at arterial blood flow in the right arm. Allow one hour for Upper Arterial scans. There are no restrictions or special instructions    Follow-Up: At [facility], you and your health needs are our priority.  As part of our continuing mission to provide you with exceptional heart care, we have created designated Provider Care Teams.  These Care Teams include your primary Cardiologist (physician) and Advanced Practice Providers (APPs -  Physician Assistants and Nurse Practitioners) who all work together to provide you with the care you need, when you need it.  We recommend signing up for the patient portal called "MyChart".  Sign up information is provided on this After Visit Summary.  MyChart is used to connect with patients for Virtual Visits (Telemedicine).  Patients are able to view lab/test results, encounter notes, upcoming appointments, etc.  Non-urgent messages can be sent to your provider as well.   To learn more about what you can do with MyChart, go to [email or web address].    Your next appointment:   6 month(s)  The format for your next appointment:   In Person  Provider:    You may see Werner Lean, MD or one of the following Advanced Practice Providers on your designated Care Team:    Melina Copa, PA-C  Ermalinda Barrios, PA-C

## 2020-03-26 ENCOUNTER — Other Ambulatory Visit: Payer: Self-pay

## 2020-03-26 ENCOUNTER — Other Ambulatory Visit: Payer: Medicare Other | Admitting: *Deleted

## 2020-03-26 DIAGNOSIS — R0600 Dyspnea, unspecified: Secondary | ICD-10-CM

## 2020-03-26 DIAGNOSIS — E119 Type 2 diabetes mellitus without complications: Secondary | ICD-10-CM | POA: Diagnosis not present

## 2020-03-26 DIAGNOSIS — R072 Precordial pain: Secondary | ICD-10-CM

## 2020-03-26 DIAGNOSIS — I10 Essential (primary) hypertension: Secondary | ICD-10-CM

## 2020-03-26 DIAGNOSIS — I700 Atherosclerosis of aorta: Secondary | ICD-10-CM

## 2020-03-26 DIAGNOSIS — E782 Mixed hyperlipidemia: Secondary | ICD-10-CM

## 2020-03-26 DIAGNOSIS — R0609 Other forms of dyspnea: Secondary | ICD-10-CM

## 2020-03-26 LAB — HEPATIC FUNCTION PANEL
ALT: 18 IU/L (ref 0–32)
AST: 15 IU/L (ref 0–40)
Albumin: 3.4 g/dL — ABNORMAL LOW (ref 3.7–4.7)
Alkaline Phosphatase: 80 IU/L (ref 44–121)
Bilirubin Total: 0.2 mg/dL (ref 0.0–1.2)
Bilirubin, Direct: <0.1 mg/dL (ref 0.00–0.40)
Total Protein: 6.1 g/dL (ref 6.0–8.5)

## 2020-03-26 LAB — LIPID PANEL
Chol/HDL Ratio: 2 ratio (ref 0.0–4.4)
Cholesterol, Total: 127 mg/dL (ref 100–199)
HDL: 62 mg/dL (ref >39)
LDL Chol Calc (NIH): 48 mg/dL (ref 0–99)
Triglycerides: 93 mg/dL (ref 0–149)
VLDL Cholesterol Cal: 17 mg/dL (ref 5–40)

## 2020-03-28 ENCOUNTER — Other Ambulatory Visit: Payer: Self-pay | Admitting: Internal Medicine

## 2020-03-28 ENCOUNTER — Other Ambulatory Visit: Payer: Self-pay

## 2020-03-28 ENCOUNTER — Ambulatory Visit (HOSPITAL_COMMUNITY)
Admission: RE | Admit: 2020-03-28 | Discharge: 2020-03-28 | Disposition: A | Discharge status: Home / Self Care | Payer: Medicare Other | Source: Ambulatory Visit | Attending: Cardiology | Admitting: Cardiology

## 2020-03-28 DIAGNOSIS — R0989 Other specified symptoms and signs involving the circulatory and respiratory systems: Secondary | ICD-10-CM | POA: Diagnosis not present

## 2020-03-28 DIAGNOSIS — S55101A Unspecified injury of radial artery at forearm level, right arm, initial encounter: Secondary | ICD-10-CM

## 2020-04-03 ENCOUNTER — Ambulatory Visit (INDEPENDENT_AMBULATORY_CARE_PROVIDER_SITE_OTHER): Payer: Medicare Other

## 2020-04-03 DIAGNOSIS — I639 Cerebral infarction, unspecified: Secondary | ICD-10-CM | POA: Diagnosis not present

## 2020-04-03 DIAGNOSIS — E1165 Type 2 diabetes mellitus with hyperglycemia: Secondary | ICD-10-CM | POA: Diagnosis not present

## 2020-04-03 DIAGNOSIS — Z794 Long term (current) use of insulin: Secondary | ICD-10-CM | POA: Diagnosis not present

## 2020-04-03 LAB — CUP PACEART REMOTE DEVICE CHECK
Date Time Interrogation Session: 20220323234402
Implantable Pulse Generator Implant Date: 20181009

## 2020-04-03 NOTE — Progress Notes (Deleted)
Subjective:   Sarah [name] is a 75 y.o. female who presents for an Initial Medicare Annual Wellness Visit.  Review of Systems    N/A       Objective:    There were no vitals filed for this visit. There is no height or weight on file to calculate BMI.  Advanced Directives 03/11/2020 10/01/2019 05/11/2019 06/23/2018 06/12/2018 04/17/2018 04/10/2018  Does Patient Have a Medical Advance Directive? No No No No No No No  Would patient like information on creating a medical advance directive? No - Patient declined No - Patient declined No - Patient declined No - Patient declined No - Patient declined No - Patient declined No - Patient declined  Pre-existing out of facility DNR order (yellow form or pink MOST form) - - - - - - -    Current Medications (verified) Outpatient Encounter Medications as of 04/04/2020  Medication Sig  . acetaminophen (TYLENOL) 500 MG tablet Take 1,000 mg by mouth every 6 (six) hours as needed for mild pain or headache.  Marland Kitchen amLODipine (NORVASC) 10 MG tablet Take 10 mg by mouth daily.  Marland Kitchen aspirin EC 81 MG tablet Swallow whole. TAKE daily until after heart Cath (Patient taking differently: Take 81 mg by mouth daily.)  . atorvastatin (LIPITOR) 80 MG tablet Take 1 tablet (80 mg total) by mouth daily at 6 PM.  . Blood Glucose Calibration (OT ULTRA/FASTTK CNTRL SOLN) SOLN   . calcium carbonate (TUMS EX) 750 MG chewable tablet Chew 1 tablet by mouth as needed for heartburn.  . clopidogrel (PLAVIX) 75 MG tablet Take 1 tablet (75 mg total) by mouth daily.  . Continuous Blood Gluc Sensor (DEXCOM G6 SENSOR) MISC 1 Device by Does not apply route as directed.  . Continuous Blood Gluc Transmit (DEXCOM G6 TRANSMITTER) MISC 1 Device by Does not apply route as directed.  . dapagliflozin propanediol (FARXIGA) 5 MG TABS tablet Take 1 tablet (5 mg total) by mouth daily before breakfast.  . diclofenac Sodium (VOLTAREN) 1 % GEL Apply 2 g topically 4 (four) times daily. (Patient taking  differently: Apply 2 g topically 4 (four) times daily as needed (pain).)  . ezetimibe (ZETIA) 10 MG tablet Take 1 tablet (10 mg total) by mouth daily.  . furosemide (LASIX) 20 MG tablet TAKE 1 TABLET(20 MG) BY MOUTH DAILY  . insulin glargine (LANTUS SOLOSTAR) 100 UNIT/ML Solostar Pen Inject 60 Units into the skin daily.  . insulin lispro (HUMALOG KWIKPEN) 200 UNIT/ML KwikPen Inject 18 Units into the skin with breakfast, with lunch, and with evening meal. 15 U 15 min before meals  . Insulin Pen Needle 32G X 4 MM MISC 1 Device by Does not apply route in the morning, at noon, in the evening, and at bedtime.  Elmore Guise Devices (SIMPLE DIAGNOSTICS LANCING DEV) MISC Check BS before and 2 hours after meals.  . liraglutide (VICTOZA) 18 MG/3ML SOPN Inject 1.8 mg into the skin daily. ADMINISTER 1.8 MG UNDER THE SKIN DAILY  . losartan (COZAAR) 50 MG tablet TAKE 1 TABLET(50 MG) BY MOUTH DAILY  . metoprolol tartrate (LOPRESSOR) 25 MG tablet Take 0.5 tablets (12.5 mg total) by mouth 2 (two) times daily.  . nitroGLYCERIN (NITROSTAT) 0.4 MG SL tablet Place 1 tablet (0.4 mg total) under the tongue every 5 (five) minutes as needed for chest pain. one tab every 5 minutes up to 3 tablets total over 15 minutes.  . ONE TOUCH ULTRA TEST test strip Check BS before and 2 hours  after meals.  Marland Kitchen PHARMACIST CHOICE ALCOHOL 70 % PADS   . ROCKLATAN 0.02-0.005 % SOLN Place 1 drop into both eyes every evening.  . sertraline (ZOLOFT) 25 MG tablet Take 1 tablet (25 mg total) by mouth daily.   No facility-administered encounter medications on file as of 04/04/2020.    Allergies (verified) Patient has no known allergies.   History: Past Medical History:  Diagnosis Date  . Bilateral carpal tunnel syndrome 03/27/2019  . Boils   . Glaucoma   . Heart murmur   . HTN (hypertension)   . Hx of adenomatous colonic polyps   . Hyperlipidemia   . Osteoporosis   . Pneumonia   . Stroke ([place])   . Type II or unspecified type diabetes  mellitus without mention of complication, uncontrolled    Past Surgical History:  Procedure Laterality Date  . ABDOMINAL HYSTERECTOMY    . CARPAL TUNNEL RELEASE     right  . COLONOSCOPY  12-10-10   per Dr. Deatra Ina, clear, repeat in 7 yrs   . CORONARY STENT INTERVENTION N/A 03/11/2020   Procedure: CORONARY STENT INTERVENTION;  Surgeon: Jettie Booze, MD;  Location: [facility];  Service: Cardiovascular;  Laterality: N/A;  . INCISION AND DRAINAGE PERIRECTAL ABSCESS N/A 09/26/2015   Procedure: IRRIGATION AND DEBRIDEMENT PERIRECTAL ABSCESS;  Surgeon: Mickeal Skinner, MD;  Location: [facility];  Service: General;  Laterality: N/A;  . INTRAVASCULAR ULTRASOUND/IVUS N/A 03/11/2020   Procedure: Intravascular Ultrasound/IVUS;  Surgeon: Jettie Booze, MD;  Location: [facility];  Service: Cardiovascular;  Laterality: N/A;  . KNEE ARTHROSCOPY     right  . LEFT HEART CATH AND CORONARY ANGIOGRAPHY N/A 03/11/2020   Procedure: LEFT HEART CATH AND CORONARY ANGIOGRAPHY;  Surgeon: Jettie Booze, MD;  Location: [facility];  Service: Cardiovascular;  Laterality: N/A;  . LOOP RECORDER INSERTION N/A 10/19/2016   Procedure: LOOP RECORDER INSERTION;  Surgeon: Thompson Grayer, MD;  Location: [facility];  Service: Cardiovascular;  Laterality: N/A;  . ORIF ANKLE FRACTURE Right 03/24/2012   Procedure: OPEN REDUCTION INTERNAL FIXATION (ORIF) ANKLE FRACTURE;  Surgeon: Newt Minion, MD;  Location: [facility];  Service: Orthopedics;  Laterality: Right;  Open Reduction Internal Fixation Right Bimalleolar ankle fracture  . POLYPECTOMY    . TEE WITHOUT CARDIOVERSION N/A 10/18/2016   Procedure: TRANSESOPHAGEAL ECHOCARDIOGRAM (TEE);  Surgeon: Fay Records, MD;  Location[facility];  Service: Cardiovascular;  Laterality: N/A;  . TONSILLECTOMY     Family History  Problem Relation Age of Onset  . Diabetes Mother   . Hypertension Mother   . Stroke Father   . Stroke Maternal Uncle   .  Stroke Paternal Uncle   . Colon cancer Neg Hx   . Esophageal cancer Neg Hx   . Rectal cancer Neg Hx   . Stomach cancer Neg Hx    Social History   Socioeconomic History  . Marital status: Single    Spouse name: Not on file  . Number of children: Not on file  . Years of education: Not on file  . Highest education level: Not on file  Occupational History  . Not on file  Tobacco Use  . Smoking status: Former Smoker    Types: Cigarettes    Quit date: 10/15/2016    Years since quitting: 3.4  . Smokeless tobacco: Never Used  . Tobacco comment: smokes occ.   Vaping Use  . Vaping Use: Never used  Substance and Sexual  Activity  . Alcohol use: Yes    Alcohol/week: 0.0 standard drinks    Comment: occ  . Drug use: No  . Sexual activity: Not on file  Other Topics Concern  . Not on file  Social History Narrative  . Not on file   Social Determinants of Health   Financial Resource Strain: Not on file  Food Insecurity: No Food Insecurity  . Worried About Charity fundraiser in the Last Year: Never true  . Ran Out of Food in the Last Year: Never true  Transportation Needs: Not on file  Physical Activity: Not on file  Stress: Stress Concern Present  . Feeling of Stress : To some extent  Social Connections: Not on file    Tobacco Counseling Counseling given: Not Answered Comment: smokes occ.    Clinical Intake:                 Diabetic?No         Activities of Daily Living In your present state of health, do you have any difficulty performing the following activities: 08/20/2019  Hearing? N  Vision? N  Difficulty concentrating or making decisions? N  Walking or climbing stairs? N  Dressing or bathing? N  Doing errands, shopping? N  Some recent data might be hidden    Patient Care Team: [name], [name], MD as PCP - General (Family Medicine) Werner Lean, MD as PCP - Cardiology (Cardiology) Dennie Bible, NP as Nurse Practitioner  (Neurology) Audery Amel Sharma Covert, [facility] (Inactive) as Pharmacist (Pharmacist)  Indicate any recent Medical Services you may have received from other than [facility] providers in the past year (date may be approximate).     Assessment:   This is a routine wellness examination for Malaina.  Hearing/Vision screen No exam data present  Dietary issues and exercise activities discussed:    Goals    . Chronic Care Management     CARE PLAN ENTRY  Current Barriers:  . Chronic Disease Management support, education, and care coordination needs related to Hypertension, Hyperlipidemia, and Diabetes   Hypertension . Pharmacist Clinical Goal(s): o Over the next 30 days, patient will work with PharmD and providers to achieve BP goal <130/80 . Current regimen:   Amlodipine 10 mg 2 tablets daily AM  Losartan 50 mg 1 tablet daily AM . Interventions: o Discussed the importance of low salt diet and increase physical activity . Patient self care activities - Over the next 30 days, patient will: o Check BP once daily, document, and provide at future appointments o Ensure daily salt intake < 2300 mg/day o Continue to walk outside 4 days a week and follow weekly recipe  Hyperlipidemia . Pharmacist Clinical Goal(s): o Over the next 30 days, patient will work with PharmD and providers to achieve LDL goal < 100 . Current regimen:   Atorvastatin 80 mg 1 tablet daily HS   Ezetimibe 10 mg 1 tablet daily AM . Interventions: o Discussed the importance of heart healthy diet and increase physical activity . Patient self care activities - Over the next 30 days, patient will: o Continue to walk 4 days a week and follow weekly recipe  Diabetes . Pharmacist Clinical Goal(s): o Over the next 30 days, patient will work with PharmD and providers to achieve A1c goal <7% . Current regimen:   Lantus 100units/mL 35 units AM and 30 units every 12 hrs (breakfast and dinner)  Humalog 100 units/mL kwikpen 5-10 units 30  mins before meals  Metformin 500 mg 1 tablet BID with a meal   Victoza 18mg /52mL inject 1.8 mg daily at lunch . Interventions: o Encourage to increase physical activity o Benefits of switching from regular soda to diet soda . Patient self care activities - Over the next 30 days, patient will: o Check blood sugar 3-4 times daily, document, and provide at future appointments o Contact provider with any episodes of hypoglycemia  Medication management . Pharmacist Clinical Goal(s): o Over the next 30 days, patient will work with PharmD and providers to maintain optimal medication adherence . Current pharmacy: [facility] . Interventions o Comprehensive medication review performed. o Continue current medication management strategy . Patient self care activities - Over the next 30 days, patient will: o Focus on medication adherence by pill boxes o Take medications as prescribed o Report any questions or concerns to PharmD and/or provider(s)  Initial goal documentation     . Decrease inpatient admissions/ readmissions with in the next year    . Decrease inpatient diabetes admissions/readmissions with in the year    . Decrease the use of hospital emergency department related to diabetes within the next year       Depression Screen PHQ 2/9 Scores 11/07/2019 10/01/2019 08/23/2019 06/23/2018 02/13/2018 01/21/2017 11/11/2016  PHQ - 2 Score 1 1 0 0 0 0 1  PHQ- 9 Score 6 - - - - - 4    Fall Risk Fall Risk  10/01/2019 06/02/2017 03/15/2017 01/21/2017 12/10/2016  Falls in the past year? 0 No No No No  Number falls in past yr: - - - - -  Injury with Fall? - - - - -    [place]:  Any stairs in or around the home? {YES/NO:21197} If so, are there any without handrails? No  Home free of loose throw rugs in walkways, pet beds, electrical cords, etc? Yes  Adequate lighting in your home to reduce risk of falls? Yes   ASSISTIVE DEVICES UTILIZED TO PREVENT FALLS:  Life  alert? {YES/NO:21197} Use of a cane, walker or w/c? {YES/NO:21197} Grab bars in the bathroom? {YES/NO:21197} Shower chair or bench in shower? {YES/NO:21197} Elevated toilet seat or a handicapped toilet? {YES/NO:21197}  TIMED UP AND GO:  Was the test performed? Yes .  Length of time to ambulate 10 feet: *** sec.   {Appearance of UXLK:4401027}  Cognitive Function:        Immunizations Immunization History  Administered Date(s) Administered  . Fluad Quad(high Dose 65+) 10/27/2018, 11/07/2019  . Influenza, High Dose Seasonal PF 11/28/2014, 10/23/2015, 10/27/2016, 12/19/2017  . Influenza,inj,Quad PF,6+ Mos 01/19/2013, 09/28/2013  . Pneumococcal Conjugate-13 03/15/2014  . Pneumococcal Polysaccharide-23 07/11/2013    TDAP status: Due, Education has been provided regarding the importance of this vaccine. Advised may receive this vaccine at local pharmacy or Health Dept. Aware to provide a copy of the vaccination record if obtained from local pharmacy or Health Dept. Verbalized acceptance and understanding.  Flu Vaccine status: Up to date  Pneumococcal vaccine status: Up to date  {Covid-19 vaccine status:2101808}  Qualifies for Shingles Vaccine? Yes   Zostavax completed No   Shingrix Completed?: No.    Education has been provided regarding the importance of this vaccine. Patient has been advised to call insurance company to determine out of pocket expense if they have not yet received this vaccine. Advised may also receive vaccine at local pharmacy or Health Dept. Verbalized acceptance and understanding.  Screening Tests Health Maintenance  Topic Date Due  .  COVID-19 Vaccine (1) Never done  . COLONOSCOPY (Pts 45-5yrs Insurance coverage will need to be confirmed)  12/24/2017  . OPHTHALMOLOGY EXAM  08/21/2019  . MAMMOGRAM  07/26/2020  . FOOT EXAM  08/14/2020  . HEMOGLOBIN A1C  08/31/2020  . INFLUENZA VACCINE  Completed  . DEXA SCAN  Completed  . Hepatitis C Screening   Completed  . PNA vac Low Risk Adult  Completed  . HPV VACCINES  Aged Out  . TETANUS/TDAP  Discontinued    Health Maintenance  Health Maintenance Due  Topic Date Due  . COVID-19 Vaccine (1) Never done  . COLONOSCOPY (Pts 45-58yrs Insurance coverage will need to be confirmed)  12/24/2017  . OPHTHALMOLOGY EXAM  08/21/2019    Colorectal cancer screening: Type of screening: Colonoscopy. Completed 12/25/2010. Repeat every 10 years  Mammogram status: Completed 10/24/2019. Repeat every year  Bone Density status: Ordered 04/04/2020. Pt provided with contact info and advised to call to schedule appt.  Lung Cancer Screening: (Low Dose CT Chest recommended if Age 13-80 years, 30 pack-year currently smoking OR have quit w/in 15years.) {DOES NOT does:27190::"does not"} qualify.   Lung Cancer Screening Referral: ***  Additional Screening:  Hepatitis C Screening: does qualify; Completed 07/29/2017  Vision Screening: Recommended annual ophthalmology exams for early detection of glaucoma and other disorders of the eye. Is the patient up to date with their annual eye exam?  {YES/NO:21197} Who is the provider or what is the name of the office in which the patient attends annual eye exams? *** If pt is not established with a provider, would they like to be referred to a provider to establish care? {YES/NO:21197}.   Dental Screening: Recommended annual dental exams for proper oral hygiene  Community Resource Referral / Chronic Care Management: CRR required this visit?  No   CCM required this visit?  No      Plan:     I have personally reviewed and noted the following in the patient's chart:   . Medical and social history . Use of alcohol, tobacco or illicit drugs  . Current medications and supplements . Functional ability and status . Nutritional status . Physical activity . Advanced directives . List of other physicians . Hospitalizations, surgeries, and ER visits in previous 12  months . Vitals . Screenings to include cognitive, depression, and falls . Referrals and appointments  In addition, I have reviewed and discussed with patient certain preventive protocols, quality metrics, and best practice recommendations. A written personalized care plan for preventive services as well as general preventive health recommendations were provided to patient.     Randel Pigg, LPN   06/27/8370   Nurse Notes: none

## 2020-04-04 ENCOUNTER — Ambulatory Visit: Payer: Medicare Other

## 2020-04-04 DIAGNOSIS — Z0000 Encounter for general adult medical examination without abnormal findings: Secondary | ICD-10-CM

## 2020-04-07 ENCOUNTER — Telehealth: Payer: Self-pay | Admitting: Internal Medicine

## 2020-04-07 DIAGNOSIS — E1165 Type 2 diabetes mellitus with hyperglycemia: Secondary | ICD-10-CM

## 2020-04-07 NOTE — Telephone Encounter (Signed)
Patient called to advise that she received her Dexcom. Stated that she is letting use know so that training can be set up for the new device

## 2020-04-15 NOTE — Progress Notes (Signed)
[facility] Summary Report / Loop Recorder 

## 2020-04-17 NOTE — Progress Notes (Deleted)
Subjective:   Sarah [name] is a 75 y.o. female who presents for an Initial Medicare Annual Wellness Visit.  Review of Systems    n/a       Objective:    There were no vitals filed for this visit. There is no height or weight on file to calculate BMI.  Advanced Directives 03/11/2020 10/01/2019 05/11/2019 06/23/2018 06/12/2018 04/17/2018 04/10/2018  Does Patient Have a Medical Advance Directive? No No No No No No No  Would patient like information on creating a medical advance directive? No - Patient declined No - Patient declined No - Patient declined No - Patient declined No - Patient declined No - Patient declined No - Patient declined  Pre-existing out of facility DNR order (yellow form or pink MOST form) - - - - - - -    Current Medications (verified) Outpatient Encounter Medications as of 04/18/2020  Medication Sig  . acetaminophen (TYLENOL) 500 MG tablet Take 1,000 mg by mouth every 6 (six) hours as needed for mild pain or headache.  Marland Kitchen amLODipine (NORVASC) 10 MG tablet Take 10 mg by mouth daily.  Marland Kitchen aspirin EC 81 MG tablet Swallow whole. TAKE daily until after heart Cath (Patient taking differently: Take 81 mg by mouth daily.)  . atorvastatin (LIPITOR) 80 MG tablet Take 1 tablet (80 mg total) by mouth daily at 6 PM.  . Blood Glucose Calibration (OT ULTRA/FASTTK CNTRL SOLN) SOLN   . calcium carbonate (TUMS EX) 750 MG chewable tablet Chew 1 tablet by mouth as needed for heartburn.  . clopidogrel (PLAVIX) 75 MG tablet Take 1 tablet (75 mg total) by mouth daily.  . Continuous Blood Gluc Sensor (DEXCOM G6 SENSOR) MISC 1 Device by Does not apply route as directed.  . Continuous Blood Gluc Transmit (DEXCOM G6 TRANSMITTER) MISC 1 Device by Does not apply route as directed.  . dapagliflozin propanediol (FARXIGA) 5 MG TABS tablet Take 1 tablet (5 mg total) by mouth daily before breakfast.  . diclofenac Sodium (VOLTAREN) 1 % GEL Apply 2 g topically 4 (four) times daily. (Patient taking  differently: Apply 2 g topically 4 (four) times daily as needed (pain).)  . ezetimibe (ZETIA) 10 MG tablet Take 1 tablet (10 mg total) by mouth daily.  . furosemide (LASIX) 20 MG tablet TAKE 1 TABLET(20 MG) BY MOUTH DAILY  . insulin glargine (LANTUS SOLOSTAR) 100 UNIT/ML Solostar Pen Inject 60 Units into the skin daily.  . insulin lispro (HUMALOG KWIKPEN) 200 UNIT/ML KwikPen Inject 18 Units into the skin with breakfast, with lunch, and with evening meal. 15 U 15 min before meals  . Insulin Pen Needle 32G X 4 MM MISC 1 Device by Does not apply route in the morning, at noon, in the evening, and at bedtime.  Elmore Guise Devices (SIMPLE DIAGNOSTICS LANCING DEV) MISC Check BS before and 2 hours after meals.  . liraglutide (VICTOZA) 18 MG/3ML SOPN Inject 1.8 mg into the skin daily. ADMINISTER 1.8 MG UNDER THE SKIN DAILY  . losartan (COZAAR) 50 MG tablet TAKE 1 TABLET(50 MG) BY MOUTH DAILY  . metoprolol tartrate (LOPRESSOR) 25 MG tablet Take 0.5 tablets (12.5 mg total) by mouth 2 (two) times daily.  . nitroGLYCERIN (NITROSTAT) 0.4 MG SL tablet Place 1 tablet (0.4 mg total) under the tongue every 5 (five) minutes as needed for chest pain. one tab every 5 minutes up to 3 tablets total over 15 minutes.  . ONE TOUCH ULTRA TEST test strip Check BS before and 2 hours  after meals.  Marland Kitchen PHARMACIST CHOICE ALCOHOL 70 % PADS   . ROCKLATAN 0.02-0.005 % SOLN Place 1 drop into both eyes every evening.  . sertraline (ZOLOFT) 25 MG tablet Take 1 tablet (25 mg total) by mouth daily.   No facility-administered encounter medications on file as of 04/18/2020.    Allergies (verified) Patient has no known allergies.   History: Past Medical History:  Diagnosis Date  . Bilateral carpal tunnel syndrome 03/27/2019  . Boils   . Glaucoma   . Heart murmur   . HTN (hypertension)   . Hx of adenomatous colonic polyps   . Hyperlipidemia   . Osteoporosis   . Pneumonia   . Stroke ([place])   . Type II or unspecified type diabetes  mellitus without mention of complication, uncontrolled    Past Surgical History:  Procedure Laterality Date  . ABDOMINAL HYSTERECTOMY    . CARPAL TUNNEL RELEASE     right  . COLONOSCOPY  12-10-10   per Dr. Deatra Ina, clear, repeat in 7 yrs   . CORONARY STENT INTERVENTION N/A 03/11/2020   Procedure: CORONARY STENT INTERVENTION;  Surgeon: Jettie Booze, MD;  Location: [facility];  Service: Cardiovascular;  Laterality: N/A;  . INCISION AND DRAINAGE PERIRECTAL ABSCESS N/A 09/26/2015   Procedure: IRRIGATION AND DEBRIDEMENT PERIRECTAL ABSCESS;  Surgeon: Mickeal Skinner, MD;  Location: [place];  Service: General;  Laterality: N/A;  . INTRAVASCULAR ULTRASOUND/IVUS N/A 03/11/2020   Procedure: Intravascular Ultrasound/IVUS;  Surgeon: Jettie Booze, MD;  Location: [facility];  Service: Cardiovascular;  Laterality: N/A;  . KNEE ARTHROSCOPY     right  . LEFT HEART CATH AND CORONARY ANGIOGRAPHY N/A 03/11/2020   Procedure: LEFT HEART CATH AND CORONARY ANGIOGRAPHY;  Surgeon: Jettie Booze, MD;  Location: [facility] [facility];  Service: Cardiovascular;  Laterality: N/A;  . LOOP RECORDER INSERTION N/A 10/19/2016   Procedure: LOOP RECORDER INSERTION;  Surgeon: Thompson Grayer, MD;  Location: [facility];  Service: Cardiovascular;  Laterality: N/A;  . ORIF ANKLE FRACTURE Right 03/24/2012   Procedure: OPEN REDUCTION INTERNAL FIXATION (ORIF) ANKLE FRACTURE;  Surgeon: Newt Minion, MD;  Location: [place] [facility];  Service: Orthopedics;  Laterality: Right;  Open Reduction Internal Fixation Right Bimalleolar ankle fracture  . POLYPECTOMY    . TEE WITHOUT CARDIOVERSION N/A 10/18/2016   Procedure: TRANSESOPHAGEAL ECHOCARDIOGRAM (TEE);  Surgeon: Fay Records, MD;  Location: [facility];  Service: Cardiovascular;  Laterality: N/A;  . TONSILLECTOMY     Family History  Problem Relation Age of Onset  . Diabetes Mother   . Hypertension Mother   . Stroke Father   . Stroke Maternal Uncle   .  Stroke Paternal Uncle   . Colon cancer Neg Hx   . Esophageal cancer Neg Hx   . Rectal cancer Neg Hx   . Stomach cancer Neg Hx    Social History   Socioeconomic History  . Marital status: Single    Spouse name: Not on file  . Number of children: Not on file  . Years of education: Not on file  . Highest education level: Not on file  Occupational History  . Not on file  Tobacco Use  . Smoking status: Former Smoker    Types: Cigarettes    Quit date: 10/15/2016    Years since quitting: 3.5  . Smokeless tobacco: Never Used  . Tobacco comment: smokes occ.   Vaping Use  . Vaping Use: Never used  Substance and Sexual  Activity  . Alcohol use: Yes    Alcohol/week: 0.0 standard drinks    Comment: occ  . Drug use: No  . Sexual activity: Not on file  Other Topics Concern  . Not on file  Social History Narrative  . Not on file   Social Determinants of Health   Financial Resource Strain: Not on file  Food Insecurity: No Food Insecurity  . Worried About Charity fundraiser in the Last Year: Never true  . Ran Out of Food in the Last Year: Never true  Transportation Needs: Not on file  Physical Activity: Not on file  Stress: Stress Concern Present  . Feeling of Stress : To some extent  Social Connections: Not on file    Tobacco Counseling Counseling given: Not Answered Comment: smokes occ.    Clinical Intake:                 Diabetic?yes Nutrition Risk Assessment:  Has the patient had any N/V/D within the last 2 months?  {YES/NO:21197} Does the patient have any non-healing wounds?  {YES/NO:21197} Has the patient had any unintentional weight loss or weight gain?  {YES/NO:21197}  Diabetes:  Is the patient diabetic?  {YES/NO:21197} If diabetic, was a CBG obtained today?  {YES/NO:21197} Did the patient bring in their glucometer from home?  {YES/NO:21197} How often do you monitor your CBG's? ***.   Financial Strains and Diabetes Management:  Are you having any  financial strains with the device, your supplies or your medication? {YES/NO:21197}.  Does the patient want to be seen by Chronic Care Management for management of their diabetes?  {YES/NO:21197} Would the patient like to be referred to a Nutritionist or for Diabetic Management?  {YES/NO:21197}  Diabetic Exams:  {Diabetic Eye Exam:2101801} {Diabetic Foot Exam:2101802}          Activities of Daily Living In your present state of health, do you have any difficulty performing the following activities: 08/20/2019  Hearing? N  Vision? N  Difficulty concentrating or making decisions? N  Walking or climbing stairs? N  Dressing or bathing? N  Doing errands, shopping? N  Some recent data might be hidden    Patient Care Team: [name], [name], MD as PCP - General (Family Medicine) Werner Lean, MD as PCP - Cardiology (Cardiology) Dennie Bible, NP as Nurse Practitioner (Neurology) Audery Amel Sharma Covert, [facility] (Inactive) as Pharmacist (Pharmacist)  Indicate any recent Medical Services you may have received from other than [facility] providers in the past year (date may be approximate).     Assessment:   This is a routine wellness examination for [place].  Hearing/Vision screen No exam data present  Dietary issues and exercise activities discussed:    Goals    . Chronic Care Management     CARE PLAN ENTRY  Current Barriers:  . Chronic Disease Management support, education, and care coordination needs related to Hypertension, Hyperlipidemia, and Diabetes   Hypertension . Pharmacist Clinical Goal(s): o Over the next 30 days, patient will work with PharmD and providers to achieve BP goal <130/80 . Current regimen:   Amlodipine 10 mg 2 tablets daily AM  Losartan 50 mg 1 tablet daily AM . Interventions: o Discussed the importance of low salt diet and increase physical activity . Patient self care activities - Over the next 30 days, patient will: o Check BP once daily,  document, and provide at future appointments o Ensure daily salt intake < 2300 mg/day o Continue to walk outside 4 days  a week and follow weekly recipe  Hyperlipidemia . Pharmacist Clinical Goal(s): o Over the next 30 days, patient will work with PharmD and providers to achieve LDL goal < 100 . Current regimen:   Atorvastatin 80 mg 1 tablet daily HS   Ezetimibe 10 mg 1 tablet daily AM . Interventions: o Discussed the importance of heart healthy diet and increase physical activity . Patient self care activities - Over the next 30 days, patient will: o Continue to walk 4 days a week and follow weekly recipe  Diabetes . Pharmacist Clinical Goal(s): o Over the next 30 days, patient will work with PharmD and providers to achieve A1c goal <7% . Current regimen:   Lantus 100units/mL 35 units AM and 30 units every 12 hrs (breakfast and dinner)  Humalog 100 units/mL kwikpen 5-10 units 30 mins before meals   Metformin 500 mg 1 tablet BID with a meal   Victoza 18mg /72mL inject 1.8 mg daily at lunch . Interventions: o Encourage to increase physical activity o Benefits of switching from regular soda to diet soda . Patient self care activities - Over the next 30 days, patient will: o Check blood sugar 3-4 times daily, document, and provide at future appointments o Contact provider with any episodes of hypoglycemia  Medication management . Pharmacist Clinical Goal(s): o Over the next 30 days, patient will work with PharmD and providers to maintain optimal medication adherence . Current pharmacy: [facility] . Interventions o Comprehensive medication review performed. o Continue current medication management strategy . Patient self care activities - Over the next 30 days, patient will: o Focus on medication adherence by pill boxes o Take medications as prescribed o Report any questions or concerns to PharmD and/or provider(s)  Initial goal documentation     . Decrease inpatient  admissions/ readmissions with in the next year    . Decrease inpatient diabetes admissions/readmissions with in the year    . Decrease the use of hospital emergency department related to diabetes within the next year       Depression Screen PHQ 2/9 Scores 11/07/2019 10/01/2019 08/23/2019 06/23/2018 02/13/2018 01/21/2017 11/11/2016  PHQ - 2 Score 1 1 0 0 0 0 1  PHQ- 9 Score 6 - - - - - 4    Fall Risk Fall Risk  10/01/2019 06/02/2017 03/15/2017 01/21/2017 12/10/2016  Falls in the past year? 0 No No No No  Number falls in past yr: - - - - -  Injury with Fall? - - - - -    [place]:  Any stairs in or around the home? {YES/NO:21197} If so, are there any without handrails? Yes  Home free of loose throw rugs in walkways, pet beds, electrical cords, etc? Yes  Adequate lighting in your home to reduce risk of falls? Yes   ASSISTIVE DEVICES UTILIZED TO PREVENT FALLS:   Life alert? {YES/NO:21197} Use of a cane, walker or w/c? {YES/NO:21197} Grab bars in the bathroom? {YES/NO:21197} Shower chair or bench in shower? {YES/NO:21197} Elevated toilet seat or a handicapped toilet? {YES/NO:21197}  TIMED UP AND GO:  Was the test performed? {YES/NO:21197}.  Length of time to ambulate 10 feet: *** sec.   {Appearance of QGBE:0100712}  Cognitive Function:   Normal cognitive status assessed by direct observation by this Nurse Health Advisor. No abnormalities found.        Immunizations Immunization History  Administered Date(s) Administered  . Fluad Quad(high Dose 65+) 10/27/2018, 11/07/2019  . Influenza, High Dose Seasonal  PF 11/28/2014, 10/23/2015, 10/27/2016, 12/19/2017  . Influenza,inj,Quad PF,6+ Mos 01/19/2013, 09/28/2013  . Pneumococcal Conjugate-13 03/15/2014  . Pneumococcal Polysaccharide-23 07/11/2013    TDAP status: Due, Education has been provided regarding the importance of this vaccine. Advised may receive this vaccine at local pharmacy or Health  Dept. Aware to provide a copy of the vaccination record if obtained from local pharmacy or Health Dept. Verbalized acceptance and understanding.  Flu Vaccine status: Up to date  Pneumococcal vaccine status: Up to date  Covid-19 vaccine status: Completed vaccines  Qualifies for Shingles Vaccine? Yes   Zostavax completed No   Shingrix Completed?: No.    Education has been provided regarding the importance of this vaccine. Patient has been advised to call insurance company to determine out of pocket expense if they have not yet received this vaccine. Advised may also receive vaccine at local pharmacy or Health Dept. Verbalized acceptance and understanding.  Screening Tests Health Maintenance  Topic Date Due  . COVID-19 Vaccine (1) Never done  . COLONOSCOPY (Pts 45-42yrs Insurance coverage will need to be confirmed)  12/24/2017  . OPHTHALMOLOGY EXAM  08/21/2019  . MAMMOGRAM  07/26/2020  . INFLUENZA VACCINE  08/11/2020  . FOOT EXAM  08/14/2020  . HEMOGLOBIN A1C  08/31/2020  . DEXA SCAN  Completed  . Hepatitis C Screening  Completed  . PNA vac Low Risk Adult  Completed  . HPV VACCINES  Aged Out  . TETANUS/TDAP  Discontinued    Health Maintenance  Health Maintenance Due  Topic Date Due  . COVID-19 Vaccine (1) Never done  . COLONOSCOPY (Pts 45-21yrs Insurance coverage will need to be confirmed)  12/24/2017  . OPHTHALMOLOGY EXAM  08/21/2019    Colorectal cancer screening: Referral to GI placed 04/18/2020. Pt aware the office will call re: appt.  Mammogram status: Ordered 04/18/2020. Pt provided with contact info and advised to call to schedule appt.   Bone Density status: Ordered 04/18/2020. Pt provided with contact info and advised to call to schedule appt.  Lung Cancer Screening: (Low Dose CT Chest recommended if Age 60-80 years, 30 pack-year currently smoking OR have quit w/in 15years.) does qualify.   Lung Cancer Screening Referral: ordered 04/18/2020  Additional  Screening:  Hepatitis C Screening: does qualify  Vision Screening: Recommended annual ophthalmology exams for early detection of glaucoma and other disorders of the eye. Is the patient up to date with their annual eye exam?  {YES/NO:21197} Who is the provider or what is the name of the office in which the patient attends annual eye exams? *** If pt is not established with a provider, would they like to be referred to a provider to establish care? {YES/NO:21197}.   Dental Screening: Recommended annual dental exams for proper oral hygiene  Community Resource Referral / Chronic Care Management: CRR required this visit?  No   CCM required this visit?  No      Plan:     I have personally reviewed and noted the following in the patient's chart:   . Medical and social history . Use of alcohol, tobacco or illicit drugs  . Current medications and supplements . Functional ability and status . Nutritional status . Physical activity . Advanced directives . List of other physicians . Hospitalizations, surgeries, and ER visits in previous 12 months . Vitals . Screenings to include cognitive, depression, and falls . Referrals and appointments  In addition, I have reviewed and discussed with patient certain preventive protocols, quality metrics, and best practice recommendations. A  written personalized care plan for preventive services as well as general preventive health recommendations were provided to patient.     Randel Pigg, LPN   02/17/7410   Nurse Notes: none

## 2020-04-18 ENCOUNTER — Other Ambulatory Visit: Payer: Self-pay

## 2020-04-18 ENCOUNTER — Ambulatory Visit (INDEPENDENT_AMBULATORY_CARE_PROVIDER_SITE_OTHER): Payer: Medicare Other

## 2020-04-18 VITALS — BP 120/70 | HR 94 | Temp 98.6°F | Wt 132.4 lb

## 2020-04-18 DIAGNOSIS — Z780 Asymptomatic menopausal state: Secondary | ICD-10-CM | POA: Diagnosis not present

## 2020-04-18 DIAGNOSIS — Z0000 Encounter for general adult medical examination without abnormal findings: Secondary | ICD-10-CM

## 2020-04-18 DIAGNOSIS — Z0100 Encounter for examination of eyes and vision without abnormal findings: Secondary | ICD-10-CM | POA: Diagnosis not present

## 2020-04-18 DIAGNOSIS — Z1231 Encounter for screening mammogram for malignant neoplasm of breast: Secondary | ICD-10-CM | POA: Diagnosis not present

## 2020-04-18 DIAGNOSIS — Z122 Encounter for screening for malignant neoplasm of respiratory organs: Secondary | ICD-10-CM

## 2020-04-18 DIAGNOSIS — Z1211 Encounter for screening for malignant neoplasm of colon: Secondary | ICD-10-CM

## 2020-04-18 NOTE — Patient Instructions (Signed)
Sarah [name] , Thank you for taking time to come for your Medicare Wellness Visit. I appreciate your ongoing commitment to your health goals. Please review the following plan we discussed and let me know if I can assist you in the future.   Screening recommendations/referrals: Colonoscopy: Currently due, orders placed this visit  Mammogram: Up to date, next due 10/23/2020  Bone Density: Currently due, orders placed this visit. You may schedule this with your mammogram in the fall. Recommended yearly ophthalmology/optometry visit for glaucoma screening and checkup Recommended yearly dental visit for hygiene and checkup  Vaccinations: Influenza vaccine: Up to date, next due fall 2022  Pneumococcal vaccine: Completed series Tdap vaccine: Currently due, you may await and injury to receive. Shingles vaccine: Currently due, if you would like to receive we recommend that you do so at your local pharmacy    Advanced directives: Advance directive discussed with you today. Even though you declined this today please call our office should you change your mind and we can give you the proper paperwork for you to fill out.   Conditions/risks identified: None   Next appointment: 07/16/2020 @ 3:00 PM with Dr. [name]   Preventive Care 73 Years and Older, Female Preventive care refers to lifestyle choices and visits with your health care provider that can promote health and wellness. What does preventive care include?  A yearly physical exam. This is also called an annual well check.  Dental exams once or twice a year.  Routine eye exams. Ask your health care provider how often you should have your eyes checked.  Personal lifestyle choices, including:  Daily care of your teeth and gums.  Regular physical activity.  Eating a healthy diet.  Avoiding tobacco and drug use.  Limiting alcohol use.  Practicing safe sex.  Taking low-dose aspirin every day.  Taking vitamin and mineral  supplements as recommended by your health care provider. What happens during an annual well check? The services and screenings done by your health care provider during your annual well check will depend on your age, overall health, lifestyle risk factors, and family history of disease. Counseling  Your health care provider may ask you questions about your:  Alcohol use.  Tobacco use.  Drug use.  Emotional well-being.  Home and relationship well-being.  Sexual activity.  Eating habits.  History of falls.  Memory and ability to understand (cognition).  Work and work Statistician.  Reproductive health. Screening  You may have the following tests or measurements:  Height, weight, and BMI.  Blood pressure.  Lipid and cholesterol levels. These may be checked every 5 years, or more frequently if you are over 72 years old.  Skin check.  Lung cancer screening. You may have this screening every year starting at age 85 if you have a 30-pack-year history of smoking and currently smoke or have quit within the past 15 years.  Fecal occult blood test (FOBT) of the stool. You may have this test every year starting at age 32.  Flexible sigmoidoscopy or colonoscopy. You may have a sigmoidoscopy every 5 years or a colonoscopy every 10 years starting at age 33.  Hepatitis C blood test.  Hepatitis B blood test.  Sexually transmitted disease (STD) testing.  Diabetes screening. This is done by checking your blood sugar (glucose) after you have not eaten for a while (fasting). You may have this done every 1-3 years.  Bone density scan. This is done to screen for osteoporosis. You may have this done starting  at age 8.  Mammogram. This may be done every 1-2 years. Talk to your health care provider about how often you should have regular mammograms. Talk with your health care provider about your test results, treatment options, and if necessary, the need for more tests. Vaccines  Your  health care provider may recommend certain vaccines, such as:  Influenza vaccine. This is recommended every year.  Tetanus, diphtheria, and acellular pertussis (Tdap, Td) vaccine. You may need a Td booster every 10 years.  Zoster vaccine. You may need this after age 55.  Pneumococcal 13-valent conjugate (PCV13) vaccine. One dose is recommended after age 56.  Pneumococcal polysaccharide (PPSV23) vaccine. One dose is recommended after age 28. Talk to your health care provider about which screenings and vaccines you need and how often you need them. This information is not intended to replace advice given to you by your health care provider. Make sure you discuss any questions you have with your health care provider. Document Released: 01/24/2015 Document Revised: 09/17/2015 Document Reviewed: 10/29/2014 Elsevier Interactive Patient Education  2017 [place] Prevention in the Home Falls can cause injuries. They can happen to people of all ages. There are many things you can do to make your home safe and to help prevent falls. What can I do on the outside of my home?  Regularly fix the edges of walkways and driveways and fix any cracks.  Remove anything that might make you trip as you walk through a door, such as a raised step or threshold.  Trim any bushes or trees on the path to your home.  Use bright outdoor lighting.  Clear any walking paths of anything that might make someone trip, such as rocks or tools.  Regularly check to see if handrails are loose or broken. Make sure that both sides of any steps have handrails.  Any raised decks and porches should have guardrails on the edges.  Have any leaves, snow, or ice cleared regularly.  Use sand or salt on walking paths during winter.  Clean up any spills in your garage right away. This includes oil or grease spills. What can I do in the bathroom?  Use night lights.  Install grab bars by the toilet and in the tub and  shower. Do not use towel bars as grab bars.  Use non-skid mats or decals in the tub or shower.  If you need to sit down in the shower, use a plastic, non-slip stool.  Keep the floor dry. Clean up any water that spills on the floor as soon as it happens.  Remove soap buildup in the tub or shower regularly.  Attach bath mats securely with double-sided non-slip rug tape.  Do not have throw rugs and other things on the floor that can make you trip. What can I do in the bedroom?  Use night lights.  Make sure that you have a light by your bed that is easy to reach.  Do not use any sheets or blankets that are too big for your bed. They should not hang down onto the floor.  Have a firm chair that has side arms. You can use this for support while you get dressed.  Do not have throw rugs and other things on the floor that can make you trip. What can I do in the kitchen?  Clean up any spills right away.  Avoid walking on wet floors.  Keep items that you use a lot in easy-to-reach places.  If  you need to reach something above you, use a strong step stool that has a grab bar.  Keep electrical cords out of the way.  Do not use floor polish or wax that makes floors slippery. If you must use wax, use non-skid floor wax.  Do not have throw rugs and other things on the floor that can make you trip. What can I do with my stairs?  Do not leave any items on the stairs.  Make sure that there are handrails on both sides of the stairs and use them. Fix handrails that are broken or loose. Make sure that handrails are as long as the stairways.  Check any carpeting to make sure that it is firmly attached to the stairs. Fix any carpet that is loose or worn.  Avoid having throw rugs at the top or bottom of the stairs. If you do have throw rugs, attach them to the floor with carpet tape.  Make sure that you have a light switch at the top of the stairs and the bottom of the stairs. If you do not  have them, ask someone to add them for you. What else can I do to help prevent falls?  Wear shoes that:  Do not have high heels.  Have rubber bottoms.  Are comfortable and fit you well.  Are closed at the toe. Do not wear sandals.  If you use a stepladder:  Make sure that it is fully opened. Do not climb a closed stepladder.  Make sure that both sides of the stepladder are locked into place.  Ask someone to hold it for you, if possible.  Clearly mark and make sure that you can see:  Any grab bars or handrails.  First and last steps.  Where the edge of each step is.  Use tools that help you move around (mobility aids) if they are needed. These include:  Canes.  Walkers.  Scooters.  Crutches.  Turn on the lights when you go into a dark area. Replace any light bulbs as soon as they burn out.  Set up your furniture so you have a clear path. Avoid moving your furniture around.  If any of your floors are uneven, fix them.  If there are any pets around you, be aware of where they are.  Review your medicines with your doctor. Some medicines can make you feel dizzy. This can increase your chance of falling. Ask your doctor what other things that you can do to help prevent falls. This information is not intended to replace advice given to you by your health care provider. Make sure you discuss any questions you have with your health care provider. Document Released: 10/24/2008 Document Revised: 06/05/2015 Document Reviewed: 02/01/2014 Elsevier Interactive Patient Education  2017 [employer].

## 2020-04-18 NOTE — Progress Notes (Signed)
Subjective:   Sarah [name] is a 75 y.o. female who presents for an Initial Medicare Annual Wellness Visit.  Review of Systems    N/A  Cardiac Risk Factors include: advanced age (>101men, >2 women);diabetes mellitus;hypertension;dyslipidemia     Objective:    Today's Vitals   04/18/20 0832  BP: 120/70  Pulse: 94  Temp: 98.6 F (37 C)  TempSrc: Oral  SpO2: 93%  Weight: 132 lb 6 oz (60 kg)   Body mass index is 25.85 kg/m.  Advanced Directives 04/18/2020 03/11/2020 10/01/2019 05/11/2019 06/23/2018 06/12/2018 04/17/2018  Does Patient Have a Medical Advance Directive? No No No No No No No  Would patient like information on creating a medical advance directive? No - Patient declined No - Patient declined No - Patient declined No - Patient declined No - Patient declined No - Patient declined No - Patient declined  Pre-existing out of facility DNR order (yellow form or pink MOST form) - - - - - - -    Current Medications (verified) Outpatient Encounter Medications as of 04/18/2020  Medication Sig  . acetaminophen (TYLENOL) 500 MG tablet Take 1,000 mg by mouth every 6 (six) hours as needed for mild pain or headache.  Marland Kitchen amLODipine (NORVASC) 10 MG tablet Take 10 mg by mouth daily.  Marland Kitchen aspirin EC 81 MG tablet Swallow whole. TAKE daily until after heart Cath (Patient taking differently: Take 81 mg by mouth daily.)  . atorvastatin (LIPITOR) 80 MG tablet Take 1 tablet (80 mg total) by mouth daily at 6 PM.  . Blood Glucose Calibration (OT ULTRA/FASTTK CNTRL SOLN) SOLN   . calcium carbonate (TUMS EX) 750 MG chewable tablet Chew 1 tablet by mouth as needed for heartburn.  . clopidogrel (PLAVIX) 75 MG tablet Take 1 tablet (75 mg total) by mouth daily.  . Continuous Blood Gluc Sensor (DEXCOM G6 SENSOR) MISC 1 Device by Does not apply route as directed.  . Continuous Blood Gluc Transmit (DEXCOM G6 TRANSMITTER) MISC 1 Device by Does not apply route as directed.  . dapagliflozin propanediol (FARXIGA)  5 MG TABS tablet Take 1 tablet (5 mg total) by mouth daily before breakfast.  . ezetimibe (ZETIA) 10 MG tablet Take 1 tablet (10 mg total) by mouth daily.  . furosemide (LASIX) 20 MG tablet TAKE 1 TABLET(20 MG) BY MOUTH DAILY  . insulin glargine (LANTUS SOLOSTAR) 100 UNIT/ML Solostar Pen Inject 60 Units into the skin daily.  . insulin lispro (HUMALOG KWIKPEN) 200 UNIT/ML KwikPen Inject 18 Units into the skin with breakfast, with lunch, and with evening meal. 15 U 15 min before meals  . Insulin Pen Needle 32G X 4 MM MISC 1 Device by Does not apply route in the morning, at noon, in the evening, and at bedtime.  Elmore Guise Devices (SIMPLE DIAGNOSTICS LANCING DEV) MISC Check BS before and 2 hours after meals.  . liraglutide (VICTOZA) 18 MG/3ML SOPN Inject 1.8 mg into the skin daily. ADMINISTER 1.8 MG UNDER THE SKIN DAILY  . losartan (COZAAR) 50 MG tablet TAKE 1 TABLET(50 MG) BY MOUTH DAILY  . metoprolol tartrate (LOPRESSOR) 25 MG tablet Take 0.5 tablets (12.5 mg total) by mouth 2 (two) times daily.  . ONE TOUCH ULTRA TEST test strip Check BS before and 2 hours after meals.  Marland Kitchen PHARMACIST CHOICE ALCOHOL 70 % PADS   . ROCKLATAN 0.02-0.005 % SOLN Place 1 drop into both eyes every evening.  . sertraline (ZOLOFT) 25 MG tablet Take 1 tablet (25 mg total) by  mouth daily.  . diclofenac Sodium (VOLTAREN) 1 % GEL Apply 2 g topically 4 (four) times daily. (Patient not taking: Reported on 04/18/2020)  . nitroGLYCERIN (NITROSTAT) 0.4 MG SL tablet Place 1 tablet (0.4 mg total) under the tongue every 5 (five) minutes as needed for chest pain. one tab every 5 minutes up to 3 tablets total over 15 minutes. (Patient not taking: Reported on 04/18/2020)   No facility-administered encounter medications on file as of 04/18/2020.    Allergies (verified) Patient has no known allergies.   History: Past Medical History:  Diagnosis Date  . Bilateral carpal tunnel syndrome 03/27/2019  . Boils   . Glaucoma   . Heart murmur   .  HTN (hypertension)   . Hx of adenomatous colonic polyps   . Hyperlipidemia   . Osteoporosis   . Pneumonia   . Stroke ([place])   . Type II or unspecified type diabetes mellitus without mention of complication, uncontrolled    Past Surgical History:  Procedure Laterality Date  . ABDOMINAL HYSTERECTOMY    . CARPAL TUNNEL RELEASE     right  . COLONOSCOPY  12-10-10   per Dr. Deatra Ina, clear, repeat in 7 yrs   . CORONARY STENT INTERVENTION N/A 03/11/2020   Procedure: CORONARY STENT INTERVENTION;  Surgeon: Jettie Booze, MD;  Location: [facility];  Service: Cardiovascular;  Laterality: N/A;  . INCISION AND DRAINAGE PERIRECTAL ABSCESS N/A 09/26/2015   Procedure: IRRIGATION AND DEBRIDEMENT PERIRECTAL ABSCESS;  Surgeon: Mickeal Skinner, MD;  Location: [facility];  Service: General;  Laterality: N/A;  . INTRAVASCULAR ULTRASOUND/IVUS N/A 03/11/2020   Procedure: Intravascular Ultrasound/IVUS;  Surgeon: Jettie Booze, MD;  Location: [facility];  Service: Cardiovascular;  Laterality: N/A;  . KNEE ARTHROSCOPY     right  . LEFT HEART CATH AND CORONARY ANGIOGRAPHY N/A 03/11/2020   Procedure: LEFT HEART CATH AND CORONARY ANGIOGRAPHY;  Surgeon: Jettie Booze, MD;  Location: [facility];  Service: Cardiovascular;  Laterality: N/A;  . LOOP RECORDER INSERTION N/A 10/19/2016   Procedure: LOOP RECORDER INSERTION;  Surgeon: Thompson Grayer, MD;  Location: [facility];  Service: Cardiovascular;  Laterality: N/A;  . ORIF ANKLE FRACTURE Right 03/24/2012   Procedure: OPEN REDUCTION INTERNAL FIXATION (ORIF) ANKLE FRACTURE;  Surgeon: Newt Minion, MD;  Location: [facility];  Service: Orthopedics;  Laterality: Right;  Open Reduction Internal Fixation Right Bimalleolar ankle fracture  . POLYPECTOMY    . TEE WITHOUT CARDIOVERSION N/A 10/18/2016   Procedure: TRANSESOPHAGEAL ECHOCARDIOGRAM (TEE);  Surgeon: Fay Records, MD;  Location: [facility];  Service: Cardiovascular;  Laterality: N/A;   . TONSILLECTOMY     Family History  Problem Relation Age of Onset  . Diabetes Mother   . Hypertension Mother   . Stroke Father   . Stroke Maternal Uncle   . Stroke Paternal Uncle   . Colon cancer Neg Hx   . Esophageal cancer Neg Hx   . Rectal cancer Neg Hx   . Stomach cancer Neg Hx    Social History   Socioeconomic History  . Marital status: Single    Spouse name: Not on file  . Number of children: Not on file  . Years of education: Not on file  . Highest education level: Not on file  Occupational History  . Not on file  Tobacco Use  . Smoking status: Former Smoker    Types: Cigarettes    Quit date: 10/15/2016    Years since  quitting: 3.5  . Smokeless tobacco: Never Used  . Tobacco comment: smokes occ.   Vaping Use  . Vaping Use: Never used  Substance and Sexual Activity  . Alcohol use: Yes    Alcohol/week: 0.0 standard drinks    Comment: occ  . Drug use: No  . Sexual activity: Not on file  Other Topics Concern  . Not on file  Social History Narrative  . Not on file   Social Determinants of Health   Financial Resource Strain: Low Risk   . Difficulty of Paying Living Expenses: Not hard at all  Food Insecurity: No Food Insecurity  . Worried About Charity fundraiser in the Last Year: Never true  . Ran Out of Food in the Last Year: Never true  Transportation Needs: No Transportation Needs  . Lack of Transportation (Medical): No  . Lack of Transportation (Non-Medical): No  Physical Activity: Inactive  . Days of Exercise per Week: 0 days  . Minutes of Exercise per Session: 0 min  Stress: No Stress Concern Present  . Feeling of Stress : Not at all  Social Connections: Socially Isolated  . Frequency of Communication with Friends and Family: More than three times a week  . Frequency of Social Gatherings with Friends and Family: More than three times a week  . Attends Religious Services: Never  . Active Member of Clubs or Organizations: No  . Attends Theatre manager Meetings: Never  . Marital Status: Divorced    Tobacco Counseling Counseling given: Not Answered Comment: smokes occ.    Clinical Intake:  Pre-visit preparation completed: Yes  Pain : No/denies pain     Nutritional Risks: None Diabetes: Yes CBG done?: No Did pt. bring in CBG monitor from home?: No  How often do you need to have someone help you when you read instructions, pamphlets, or other written materials from your doctor or pharmacy?: 1 - Never  Diabetic?Yes Nutrition Risk Assessment:  Has the patient had any N/V/D within the last 2 months?  No  Does the patient have any non-healing wounds?  No  Has the patient had any unintentional weight loss or weight gain?  No   Diabetes:  Is the patient diabetic?  Yes  If diabetic, was a CBG obtained today?  No  Did the patient bring in their glucometer from home?  No  How often do you monitor your CBG's? Checks glucose twice per day.   Financial Strains and Diabetes Management:  Are you having any financial strains with the device, your supplies or your medication? No .  Does the patient want to be seen by Chronic Care Management for management of their diabetes?  No  Would the patient like to be referred to a Nutritionist or for Diabetic Management?  No   Diabetic Exams:  Diabetic Eye Exam: Overdue for diabetic eye exam. Pt has been advised about the importance in completing this exam. Patient advised to call and schedule an eye exam. Diabetic Foot Exam: Completed 08/15/2019   Interpreter Needed?: No  Information entered by :: [place] of Daily Living In your present state of health, do you have any difficulty performing the following activities: 04/18/2020 08/20/2019  Hearing? N N  Vision? N N  Difficulty concentrating or making decisions? Y N  Comment has some issues remembering things questions self on if she is driving the right way sometimes -  Walking or climbing stairs? N N   Dressing or bathing? N N  Doing errands, shopping? N N  Preparing Food and eating ? N -  Using the Toilet? N -  In the past six months, have you accidently leaked urine? N -  Do you have problems with loss of bowel control? N -  Managing your Medications? N -  Managing your Finances? N -  Housekeeping or managing your Housekeeping? N -  Some recent data might be hidden    Patient Care Team: [name], [name], MD as PCP - General (Family Medicine) Werner Lean, MD as PCP - Cardiology (Cardiology) Dennie Bible, NP as Nurse Practitioner (Neurology) Audery Amel Sharma Covert, [facility] (Inactive) as Pharmacist (Pharmacist)  Indicate any recent Medical Services you may have received from other than [facility] providers in the past year (date may be approximate).     Assessment:   This is a routine wellness examination for Shanel.  Hearing/Vision screen  Hearing Screening   125Hz  250Hz  500Hz  1000Hz  2000Hz  3000Hz  4000Hz  6000Hz  8000Hz   Right ear:           Left ear:           Vision Screening Comments: Patient states gets eye exams yearly. Had hx of cataract surgery. Wears reading glasses    Dietary issues and exercise activities discussed: Current Exercise Habits: The patient does not participate in regular exercise at present, Exercise limited by: cardiac condition(s)  Goals    . Chronic Care Management     CARE PLAN ENTRY  Current Barriers:  . Chronic Disease Management support, education, and care coordination needs related to Hypertension, Hyperlipidemia, and Diabetes   Hypertension . Pharmacist Clinical Goal(s): o Over the next 30 days, patient will work with PharmD and providers to achieve BP goal <130/80 . Current regimen:   Amlodipine 10 mg 2 tablets daily AM  Losartan 50 mg 1 tablet daily AM . Interventions: o Discussed the importance of low salt diet and increase physical activity . Patient self care activities - Over the next 30 days, patient will: o Check BP  once daily, document, and provide at future appointments o Ensure daily salt intake < 2300 mg/day o Continue to walk outside 4 days a week and follow weekly recipe  Hyperlipidemia . Pharmacist Clinical Goal(s): o Over the next 30 days, patient will work with PharmD and providers to achieve LDL goal < 100 . Current regimen:   Atorvastatin 80 mg 1 tablet daily HS   Ezetimibe 10 mg 1 tablet daily AM . Interventions: o Discussed the importance of heart healthy diet and increase physical activity . Patient self care activities - Over the next 30 days, patient will: o Continue to walk 4 days a week and follow weekly recipe  Diabetes . Pharmacist Clinical Goal(s): o Over the next 30 days, patient will work with PharmD and providers to achieve A1c goal <7% . Current regimen:   Lantus 100units/mL 35 units AM and 30 units every 12 hrs (breakfast and dinner)  Humalog 100 units/mL kwikpen 5-10 units 30 mins before meals   Metformin 500 mg 1 tablet BID with a meal   Victoza 18mg /60mL inject 1.8 mg daily at lunch . Interventions: o Encourage to increase physical activity o Benefits of switching from regular soda to diet soda . Patient self care activities - Over the next 30 days, patient will: o Check blood sugar 3-4 times daily, document, and provide at future appointments o Contact provider with any episodes of hypoglycemia  Medication management . Pharmacist Clinical Goal(s): o Over the next  30 days, patient will work with PharmD and providers to maintain optimal medication adherence . Current pharmacy: [facility] . Interventions o Comprehensive medication review performed. o Continue current medication management strategy . Patient self care activities - Over the next 30 days, patient will: o Focus on medication adherence by pill boxes o Take medications as prescribed o Report any questions or concerns to PharmD and/or provider(s)  Initial goal documentation     . Decrease  inpatient admissions/ readmissions with in the next year    . Decrease inpatient diabetes admissions/readmissions with in the year    . Decrease the use of hospital emergency department related to diabetes within the next year     . Patient Stated     I would like to get dental procedures done.       Depression Screen PHQ 2/9 Scores 04/18/2020 11/07/2019 10/01/2019 08/23/2019 06/23/2018 02/13/2018 01/21/2017  PHQ - 2 Score 0 1 1 0 0 0 0  PHQ- 9 Score - 6 - - - - -    Fall Risk Fall Risk  04/18/2020 10/01/2019 06/02/2017 03/15/2017 01/21/2017  Falls in the past year? 0 0 No No No  Number falls in past yr: 0 - - - -  Injury with Fall? 0 - - - -  Risk for fall due to : No Fall Risks - - - -  Follow up Falls evaluation completed;Falls prevention discussed - - - -    FALL RISK PREVENTION PERTAINING TO THE HOME:  Any stairs in or around the home? Yes  If so, are there any without handrails? No  Home free of loose throw rugs in walkways, pet beds, electrical cords, etc? Yes  Adequate lighting in your home to reduce risk of falls? Yes   ASSISTIVE DEVICES UTILIZED TO PREVENT FALLS:  Life alert? No  Use of a cane, walker or w/c? No  Grab bars in the bathroom? Yes  Shower chair or bench in shower? No  Elevated toilet seat or a handicapped toilet? No   TIMED UP AND GO:  Was the test performed? Yes .  Length of time to ambulate 10 feet: 4 sec.   Gait steady and fast without use of assistive device  Cognitive Function:     6CIT Screen 04/18/2020  What Year? 0 points  What month? 0 points  What time? 0 points  Count back from 20 0 points  Months in reverse 0 points  Repeat phrase 0 points  Total Score 0    Immunizations Immunization History  Administered Date(s) Administered  . Fluad Quad(high Dose 65+) 10/27/2018, 11/07/2019  . Influenza, High Dose Seasonal PF 11/28/2014, 10/23/2015, 10/27/2016, 12/19/2017  . Influenza,inj,Quad PF,6+ Mos 01/19/2013, 09/28/2013  . Pneumococcal  Conjugate-13 03/15/2014  . Pneumococcal Polysaccharide-23 07/11/2013    TDAP status: Due, Education has been provided regarding the importance of this vaccine. Advised may receive this vaccine at local pharmacy or Health Dept. Aware to provide a copy of the vaccination record if obtained from local pharmacy or Health Dept. Verbalized acceptance and understanding.  Flu Vaccine status: Up to date  Pneumococcal vaccine status: Up to date  Covid-19 vaccine status: Completed vaccines  Qualifies for Shingles Vaccine? Yes   Zostavax completed No   Shingrix Completed?: No.    Education has been provided regarding the importance of this vaccine. Patient has been advised to call insurance company to determine out of pocket expense if they have not yet received this vaccine. Advised may also receive vaccine at local  pharmacy or Health Dept. Verbalized acceptance and understanding.  Screening Tests Health Maintenance  Topic Date Due  . COVID-19 Vaccine (1) Never done  . COLONOSCOPY (Pts 45-38yrs Insurance coverage will need to be confirmed)  12/24/2017  . OPHTHALMOLOGY EXAM  08/21/2019  . INFLUENZA VACCINE  08/11/2020  . FOOT EXAM  08/14/2020  . HEMOGLOBIN A1C  08/31/2020  . MAMMOGRAM  10/23/2020  . DEXA SCAN  Completed  . Hepatitis C Screening  Completed  . PNA vac Low Risk Adult  Completed  . HPV VACCINES  Aged Out  . TETANUS/TDAP  Discontinued    Health Maintenance  Health Maintenance Due  Topic Date Due  . COVID-19 Vaccine (1) Never done  . COLONOSCOPY (Pts 45-60yrs Insurance coverage will need to be confirmed)  12/24/2017  . OPHTHALMOLOGY EXAM  08/21/2019    Colorectal cancer screening: Referral to GI placed 04/18/2020. Pt aware the office will call re: appt.  Mammogram status: Completed 10/24/2019. Repeat every year  Bone Density status: Completed 02/08/2013. Results reflect: Bone density results: OSTEOPENIA. Repeat every 5 years.  Lung Cancer Screening: (Low Dose CT Chest  recommended if Age 58-80 years, 30 pack-year currently smoking OR have quit w/in 15years.) does not qualify.   Lung Cancer Screening Referral: N/A   Additional Screening:  Hepatitis C Screening: does qualify; Completed 07/29/2017  Vision Screening: Recommended annual ophthalmology exams for early detection of glaucoma and other disorders of the eye. Is the patient up to date with their annual eye exam?  Yes  Who is the provider or what is the name of the office in which the patient attends annual eye exams? Dr. Brigitte Pulse  If pt is not established with a provider, would they like to be referred to a provider to establish care? No .   Dental Screening: Recommended annual dental exams for proper oral hygiene  Community Resource Referral / Chronic Care Management: CRR required this visit?  No   CCM required this visit?  No      Plan:     I have personally reviewed and noted the following in the patient's chart:   . Medical and social history . Use of alcohol, tobacco or illicit drugs  . Current medications and supplements . Functional ability and status . Nutritional status . Physical activity . Advanced directives . List of other physicians . Hospitalizations, surgeries, and ER visits in previous 12 months . Vitals . Screenings to include cognitive, depression, and falls . Referrals and appointments  In addition, I have reviewed and discussed with patient certain preventive protocols, quality metrics, and best practice recommendations. A written personalized care plan for preventive services as well as general preventive health recommendations were provided to patient.     Ofilia Neas, LPN   05/18/3218   Nurse Notes: None

## 2020-04-22 ENCOUNTER — Other Ambulatory Visit: Payer: Self-pay

## 2020-04-22 ENCOUNTER — Encounter: Payer: Medicare Other | Attending: Internal Medicine | Admitting: Nutrition

## 2020-04-22 ENCOUNTER — Ambulatory Visit: Payer: Medicare Other | Admitting: Nutrition

## 2020-04-22 DIAGNOSIS — E119 Type 2 diabetes mellitus without complications: Secondary | ICD-10-CM | POA: Diagnosis not present

## 2020-04-22 DIAGNOSIS — I10 Essential (primary) hypertension: Secondary | ICD-10-CM | POA: Insufficient documentation

## 2020-04-22 NOTE — Patient Instructions (Signed)
Read manual Call 800 help line if questions or problems

## 2020-04-22 NOTE — Progress Notes (Signed)
Pt. Is here to start her Dexcom.  We discussed the difference between sensor readings and glucose readings, and she reported good understanding of this. She was shown how to enter the transmitter number and sensor number and how to attach/remove each sensor.   She attached a new sensor into her right lower abdominal area without any hesitation and attached the transmitter and started the sensor She was encouraged to read the manual and we discussed what the arrows mean.  She had no final questions

## 2020-04-28 ENCOUNTER — Telehealth: Payer: Self-pay | Admitting: Dietician

## 2020-04-28 ENCOUNTER — Ambulatory Visit: Payer: Medicare Other | Admitting: Dietician

## 2020-04-28 NOTE — Telephone Encounter (Signed)
Patient called stating that she has had the Dexcom placed on 04/22/2020 and would like to reschedule today's visit.  She states that her blood sugar reading have improved (118 and 132 in the am for example and 243 has been the highest after eating "something I shouldn't".) She states that she is learning from the [facility].  Appointment rescheduled for 05/09/2020.  Antonieta Iba, RD, LDN, CDCES

## 2020-04-29 ENCOUNTER — Telehealth: Payer: Self-pay | Admitting: Pharmacist

## 2020-04-29 DIAGNOSIS — Z79899 Other long term (current) drug therapy: Secondary | ICD-10-CM

## 2020-04-29 NOTE — Progress Notes (Addendum)
[facility])                                            [place] Team                                        Statin Quality Measure Assessment    04/29/2020  Sarah [name] [birth year] [id number]   Per review of chart and payor information, patient has a diagnosis of diabetes and cardiovascular disease but is not currently filling a statin prescription.  This places patient into the SUPD (Statin Use In Patients with Diabetes) and SPC (Statin Use in Patients with Cardiovascular Disease) measures for CMS.    Patient was called. She reported still taking Atorvastatin 80mg  1 tablet daily but the last time it was filled was 06/21.  She said she may have stopped taking it a few months ago but recently started back.  She reported having at least "1/2 of a bottle " of medication left and did not want me to request a refill from her Pharmacy on her behalf.  Next appointment with PCP:  06/05/20       Component Value Date/Time   CHOL 127 03/26/2020 0954   TRIG 93 03/26/2020 0954   HDL 62 03/26/2020 0954   CHOLHDL 2.0 03/26/2020 0954   CHOLHDL 3 04/27/2019 1100   VLDL 22.8 04/27/2019 1100   LDLCALC 48 03/26/2020 0954    Please consider ONE of the following recommendations:  ? Initiate high intensity statin Atorvastatin 40mg  once daily, #90, 3 refills   Rosuvastatin 20mg  once daily, #90, 3 refills    ? Initiate moderate intensity  statin with reduced frequency if prior  statin intolerance 1x weekly, #13, 3 refills   2x weekly, #26, 3 refills   3x weekly, #39, 3 refills    ? Code for past statin intolerance  (required annually)  Provider Requirements: Must associate code during an office visit or telehealth encounter   Drug Induced Myopathy G72.0   Myositis, unspecified M60.9   Myopathy, unspecified G72.9   Rhabdomyolysis  X93.71   Alcoholic cirrhosis of liver without ascites I96.78   Alcoholic cirrhosis of  liver with ascites K70.31   Unspecified cirrhosis of liver K74.60   Toxic liver disease with fibrosis and cirrhosis of liver K71.7     Plan: Follow up in a month to see if patient needs a refill. Send note to provider to address statin adherence during office visit on 06/05/20.  Provider may address Elayne Guerin, PharmD, [place] Clinical Pharmacist [phone]

## 2020-05-03 ENCOUNTER — Other Ambulatory Visit: Payer: Self-pay | Admitting: Family Medicine

## 2020-05-05 ENCOUNTER — Ambulatory Visit (INDEPENDENT_AMBULATORY_CARE_PROVIDER_SITE_OTHER): Payer: Medicare Other

## 2020-05-05 DIAGNOSIS — I639 Cerebral infarction, unspecified: Secondary | ICD-10-CM

## 2020-05-06 LAB — CUP PACEART REMOTE DEVICE CHECK
Date Time Interrogation Session: 20220423234442
Implantable Pulse Generator Implant Date: 20181009

## 2020-05-09 ENCOUNTER — Ambulatory Visit: Payer: Medicare Other | Admitting: Dietician

## 2020-05-09 ENCOUNTER — Encounter: Payer: Self-pay | Admitting: Dietician

## 2020-05-09 DIAGNOSIS — E119 Type 2 diabetes mellitus without complications: Secondary | ICD-10-CM | POA: Diagnosis not present

## 2020-05-09 DIAGNOSIS — Z794 Long term (current) use of insulin: Secondary | ICD-10-CM

## 2020-05-09 DIAGNOSIS — I10 Essential (primary) hypertension: Secondary | ICD-10-CM | POA: Diagnosis not present

## 2020-05-09 NOTE — Progress Notes (Signed)
Diabetes Self-Management Education  Visit Type: First/Initial  Appt. Start Time: 1430 Appt. End Time: 1500  05/09/2020  [name], identified by name and date of birth, is a 75 y.o. female with a diagnosis of Diabetes: Type 2.   ASSESSMENT This is a phone visit as the patient's car is at the shop.  Patient is at home and I am at my office.  She was last seen by this RD on 02/07/20. She had a Dexcom CGM placed 04/22/2020 and continues to learn from this.  Dexcom invite sent to patient's email today and entered her into the [place] system. She states that she has started the Zoloft and feels a little better. She states that she is now more consistent with taking her insulin. She is using the portion chart and increasing her vegetable intake. She is starting a research study with a cardiac exercise program 3 times weekly. Her A1C decreased from 14.3% 03/03/20 to 11.1% mid March 2022 and expects further decrease at the next visit.  (11.1% was obtained by a house call visit from [facility].) Fasting sensor readings 120's and 168 currently.  Improved from 533 the 02/07/20 while in the office. Sensor reading 76 a couple of times.  She ate a peppermint candy and symptoms resolved.  History includes Type 2 Diabetes (for about 30 years), HTN, dyslipidemia, stroke A1C 11.1 03/2020, 14.3% 03/03/20, 12.5% and Fructosamine 331 08/01/19, 12.3% 04/27/2019 Medication includes Lantus 60 units q am, Humalog 18 units, Victoza before meals  Patient lives with her 37 yo mom and cares for her. Her sister helps when possible. States that her mom is negative and this causes Sarah [name] to struggle with depression. She does the shopping and cooking. Mom has lost her taste buds and finding something mom enjoys is difficult. She is retired and worked for [employer] for 30 years and moved back from [place] 16 years ago. Walks in the neighborhood and has a two story house.    Diabetes Self-Management Education  - 05/09/20 1727      Visit Information   Visit Type First/Initial      Initial Visit   Diabetes Type Type 2      Psychosocial Assessment   Other persons present Patient      Pre-Education Assessment   Patient understands the diabetes disease and treatment process. Demonstrates understanding / competency    Patient understands incorporating nutritional management into lifestyle. Needs Review    Patient undertands incorporating physical activity into lifestyle. Demonstrates understanding / competency    Patient understands using medications safely. Demonstrates understanding / competency    Patient understands monitoring blood glucose, interpreting and using results Needs Review    Patient understands prevention, detection, and treatment of acute complications. Demonstrates understanding / competency    Patient understands prevention, detection, and treatment of chronic complications. Demonstrates understanding / competency    Patient understands how to develop strategies to address psychosocial issues. Needs Review    Patient understands how to develop strategies to promote health/change behavior. Needs Review      Complications   Last HgB A1C per patient/outside source 11.1 %    How often do you check your blood sugar? > 4 times/day    Fasting Blood glucose range (mg/dL) 70-129    Postprandial Blood glucose range (mg/dL) 130-179    Number of hypoglycemic episodes per month 0      Dietary Intake   Breakfast plain cheerios, banana, skim milk OR yhogurt, coffee, 1 plain french toast  stick or toast    Lunch sandwich, fruit OR broccoli  and cheese or tomato soup    Dinner liver or baked chicken with mashed potatoes or mac and cheesem vegetables OR [employer] meal    Beverage(s) water, occasional diet Pepsi, diet Snapple      Exercise   Exercise Type Light (walking / raking leaves)      Patient Education   Previous Diabetes Education Yes (please comment)   04/2020   Disease state  --    encouraged   Nutrition management  Other (comment)    Physical activity and exercise  Other (comment)   encouraged   Medications Reviewed patients medication for diabetes, action, purpose, timing of dose and side effects.    Monitoring Taught/discussed recording of test results and interpretation of SMBG.    Acute complications Taught treatment of hypoglycemia - the 15 rule.    Psychosocial adjustment Worked with patient to identify barriers to care and solutions      Individualized Goals (developed by patient)   Nutrition General guidelines for healthy choices and portions discussed    Physical Activity Exercise 3-5 times per week;45 minutes per day    Medications take my medication as prescribed    Monitoring  test my blood glucose as discussed    Reducing Risk increase portions of healthy fats      Post-Education Assessment   Patient understands the diabetes disease and treatment process. Demonstrates understanding / competency    Patient understands incorporating nutritional management into lifestyle. Needs Review    Patient undertands incorporating physical activity into lifestyle. Demonstrates understanding / competency    Patient understands using medications safely. Demonstrates understanding / competency    Patient understands monitoring blood glucose, interpreting and using results Needs Review    Patient understands prevention, detection, and treatment of acute complications. Demonstrates understanding / competency    Patient understands prevention, detection, and treatment of chronic complications. Demonstrates understanding / competency    Patient understands how to develop strategies to address psychosocial issues. Needs Review    Patient understands how to develop strategies to promote health/change behavior. Needs Review      Outcomes   Expected Outcomes Demonstrated interest in learning. Expect positive outcomes    Future DMSE 2 months    Program Status Not Completed            Individualized Plan for Diabetes Self-Management Training:   Learning Objective:  Patient will have a greater understanding of diabetes self-management. Patient education plan is to attend individual and/or group sessions per assessed needs and concerns.   Plan:   There are no Patient Instructions on file for this visit.  Expected Outcomes:  Demonstrated interest in learning. Expect positive outcomes  Education material provided:   If problems or questions, patient to contact team via:  Phone  Future DSME appointment: 2 months

## 2020-05-16 ENCOUNTER — Ambulatory Visit: Payer: Medicare Other | Admitting: Internal Medicine

## 2020-05-19 ENCOUNTER — Telehealth (HOSPITAL_COMMUNITY): Payer: Self-pay | Admitting: Student-PharmD

## 2020-05-21 NOTE — Telephone Encounter (Signed)
Cardiac Rehab Medication Review by a Pharmacist  Does the patient feel that his/her medications are working for him/her?  yes  Has the patient been experiencing any side effects to the medications prescribed?  no  Does the patient measure his/her own blood pressure or blood glucose at home?  yes - Dexcom     Does the patient have any problems obtaining medications due to transportation or finances?   no  Understanding of regimen: good Understanding of indications: good Potential of compliance: good  Pharmacist Intervention: None needed at this time  Rebbeca Paul, PharmD PGY1 Pharmacy Resident 05/21/2020 4:56 PM  Please check [email or web address] for unit-specific pharmacy phone numbers.

## 2020-05-22 NOTE — Progress Notes (Signed)
[facility] Summary Report / Loop Recorder 

## 2020-05-28 ENCOUNTER — Telehealth (HOSPITAL_COMMUNITY): Payer: Self-pay

## 2020-05-28 ENCOUNTER — Encounter: Payer: Self-pay | Admitting: Family Medicine

## 2020-05-28 ENCOUNTER — Ambulatory Visit (INDEPENDENT_AMBULATORY_CARE_PROVIDER_SITE_OTHER): Payer: Medicare Other | Admitting: Family Medicine

## 2020-05-28 ENCOUNTER — Ambulatory Visit (INDEPENDENT_AMBULATORY_CARE_PROVIDER_SITE_OTHER): Payer: Medicare Other

## 2020-05-28 ENCOUNTER — Other Ambulatory Visit: Payer: Self-pay

## 2020-05-28 VITALS — BP 140/60 | HR 76 | Temp 98.5°F | Ht 60.0 in | Wt 144.0 lb

## 2020-05-28 DIAGNOSIS — S93602A Unspecified sprain of left foot, initial encounter: Secondary | ICD-10-CM

## 2020-05-28 DIAGNOSIS — S83422A Sprain of lateral collateral ligament of left knee, initial encounter: Secondary | ICD-10-CM

## 2020-05-28 DIAGNOSIS — M810 Age-related osteoporosis without current pathological fracture: Secondary | ICD-10-CM | POA: Diagnosis not present

## 2020-05-28 DIAGNOSIS — M7989 Other specified soft tissue disorders: Secondary | ICD-10-CM | POA: Diagnosis not present

## 2020-05-28 DIAGNOSIS — M1712 Unilateral primary osteoarthritis, left knee: Secondary | ICD-10-CM | POA: Diagnosis not present

## 2020-05-28 DIAGNOSIS — S8992XA Unspecified injury of left lower leg, initial encounter: Secondary | ICD-10-CM | POA: Diagnosis not present

## 2020-05-28 DIAGNOSIS — S93402A Sprain of unspecified ligament of left ankle, initial encounter: Secondary | ICD-10-CM

## 2020-05-28 DIAGNOSIS — M7732 Calcaneal spur, left foot: Secondary | ICD-10-CM | POA: Diagnosis not present

## 2020-05-28 NOTE — Progress Notes (Signed)
   Subjective:    Patient ID: Sarah [name], female    DOB: 1945-02-15, 75 y.o.   MRN: [id number]  HPI Here for injuries to the left leg that occurred 3 days ago at home. As she was walking down some steps her left knee gave way and buckled underneath her. She was able to ctch herself to keep from falling all the way down. Since then she has had pain in the left knee, the left ankle and the left foot. Her left foot has also been swollen. She has been elevating the leg and taking Tylenol. The Tylenol has actually given her fairly good pain relief.    Review of Systems  Constitutional: Negative.   Respiratory: Negative.   Cardiovascular: Negative.   Musculoskeletal: Positive for arthralgias.       Objective:   Physical Exam Constitutional:      Appearance: Normal appearance.     Comments: Walks with a slight limp   Cardiovascular:     Rate and Rhythm: Normal rate and regular rhythm.     Pulses: Normal pulses.     Heart sounds: Normal heart sounds.  Pulmonary:     Effort: Pulmonary effort is normal.     Breath sounds: Normal breath sounds.  Musculoskeletal:     Comments: Left knee is not swollen. She is tender over the lateral portion of the knee, and ROM is limited by pain. No crepitus. The left ankle is tender over the anterior high ankle area. Full ROM. The left forefoot is swollen and quite tender. No crepitus.  Neurological:     Mental Status: She is alert.           Assessment & Plan:  She has sprains to the left lateral collateral ligament of the knee and a left high ankle sprain. She may also have fracture(s) to the left metatarsals. We will get Xrays today of all these areas. She will use Tylenol for pain. She amy ice the areas as well several times a day.  Alysia Penna, MD

## 2020-05-29 ENCOUNTER — Telehealth (HOSPITAL_COMMUNITY): Payer: Self-pay | Admitting: *Deleted

## 2020-05-29 ENCOUNTER — Ambulatory Visit (HOSPITAL_COMMUNITY): Payer: Medicare Other

## 2020-05-29 ENCOUNTER — Other Ambulatory Visit: Payer: Self-pay | Admitting: Family Medicine

## 2020-05-29 DIAGNOSIS — R600 Localized edema: Secondary | ICD-10-CM

## 2020-05-29 NOTE — Telephone Encounter (Signed)
Spoke with Mrs Soucek she will not be able to attend cardiac rehab orientation due to a recent ankle injury. Will cancel appointment. Patient will call to reschedule.Barnet Pall, RN,BSN 05/29/2020 8:13 AM

## 2020-05-30 ENCOUNTER — Telehealth: Payer: Self-pay | Admitting: Family Medicine

## 2020-05-30 NOTE — Telephone Encounter (Signed)
Patient is calling and wanted to see if her results from her xray was back yet, please advise. CB is [phone]

## 2020-06-02 ENCOUNTER — Ambulatory Visit (HOSPITAL_COMMUNITY): Payer: Medicare Other

## 2020-06-04 ENCOUNTER — Ambulatory Visit (HOSPITAL_COMMUNITY): Payer: Medicare Other

## 2020-06-05 ENCOUNTER — Encounter: Payer: Medicare Other | Attending: Internal Medicine | Admitting: Dietician

## 2020-06-05 ENCOUNTER — Ambulatory Visit (INDEPENDENT_AMBULATORY_CARE_PROVIDER_SITE_OTHER): Payer: Medicare Other

## 2020-06-05 ENCOUNTER — Ambulatory Visit: Payer: Medicare Other | Admitting: Internal Medicine

## 2020-06-05 ENCOUNTER — Encounter: Payer: Self-pay | Admitting: Dietician

## 2020-06-05 DIAGNOSIS — Z794 Long term (current) use of insulin: Secondary | ICD-10-CM | POA: Diagnosis not present

## 2020-06-05 DIAGNOSIS — I639 Cerebral infarction, unspecified: Secondary | ICD-10-CM | POA: Diagnosis not present

## 2020-06-05 DIAGNOSIS — E1142 Type 2 diabetes mellitus with diabetic polyneuropathy: Secondary | ICD-10-CM | POA: Diagnosis not present

## 2020-06-05 LAB — CUP PACEART REMOTE DEVICE CHECK
Date Time Interrogation Session: 20220524234744
Implantable Pulse Generator Implant Date: 20181009

## 2020-06-05 NOTE — Telephone Encounter (Signed)
Pt was given her x-ray results

## 2020-06-05 NOTE — Progress Notes (Signed)
Diabetes Self-Management Education  Visit Type: Follow-up  Appt. Start Time: 1130 Appt. End Time: 1200  06/05/2020  [name], identified by name and date of birth, is a 75 y.o. female with a diagnosis of Diabetes:  .   ASSESSMENT This is a televisit today.  Patient is at home and I am in my home working virtually.  Our last televisit was 05/09/2020.  She states that she has not had a new A1C. She has been off Victoza for the past couple days as her insurance is not covering this.  I messaged the [facility] team to assist with this prescription.  She has had a couple of instances of lower sensor readings (86 and going down).  She was symptomatic and treated with OJ.  Discussed normal blood glucose range and need to avoid over treatment.  Discussed to let Dr. Kelton Pillar know and she would also see this on the [facility] download.  Sensor readings higher now that she is out of Victoza.    She states that she continues to take her insulin and other diabetes medications consistently.  She tripped down the stairs and sprained her foot.  Due to this cardiac rehabe is being delayed 1 week.  She drinks 8 oz regular soda periodically.  Diet low in vegetables and fruit. Food prices affecting food choices at times.  History includes Type 2 Diabetes (for about 30 years), HTN, dyslipidemia, stroke A1C 11.1 03/2020, 14.3% 03/03/20, 12.5% and Fructosamine 331 08/01/19, 12.3% 04/27/2019 Medication includes Lantus 60 units q am, Humalog 18 units, Victoza before meals, Farxiga.  Patient lives with her 49 yo mom and cares for her. Her sister helps when possible. States that her mom is negative and this causes Sarah [name] to struggle with depression. She does the shopping and cooking. Mom has lost her taste buds and finding something mom enjoys is difficult. She is retired and worked for [employer] for 30 years and moved back from [place] 16 years ago. Walks in the neighborhood and has a two story  house.    Diabetes Self-Management Education - 06/05/20 1253      Visit Information   Visit Type Follow-up      Psychosocial Assessment   Other persons present Patient    Special Needs None      Pre-Education Assessment   Patient understands the diabetes disease and treatment process. Demonstrates understanding / competency    Patient understands incorporating nutritional management into lifestyle. Needs Review    Patient undertands incorporating physical activity into lifestyle. Demonstrates understanding / competency    Patient understands using medications safely. Demonstrates understanding / competency    Patient understands monitoring blood glucose, interpreting and using results Needs Review    Patient understands prevention, detection, and treatment of acute complications. Needs Review    Patient understands prevention, detection, and treatment of chronic complications. Demonstrates understanding / competency    Patient understands how to develop strategies to address psychosocial issues. Needs Review    Patient understands how to develop strategies to promote health/change behavior. Needs Review      Complications   How often do you check your blood sugar? > 4 times/day   Dexcom   Fasting Blood glucose range (mg/dL) 70-129;130-179    Postprandial Blood glucose range (mg/dL) 130-179;180-200;>200    Number of hypoglycemic episodes per month 2   symptomatic at 86   Can you tell when your blood sugar is low? Yes    What do you do if your  blood sugar is low? OJ      Dietary Intake   Breakfast frosted flakes, banana, skim milk    Lunch [place] and cheese on white OR broccoli cheese soup    Dinner liver and onions, rice    Beverage(s) water, Diet tea, skim milk, OJ to treat lows, occasional regular soda (8 oz)      Exercise   Exercise Type ADL's      Patient Education   Previous Diabetes Education Yes (please comment)   04/2020   Nutrition management  Meal options for control  of blood glucose level and chronic complications.    Medications Reviewed patients medication for diabetes, action, purpose, timing of dose and side effects.    Acute complications Other (comment)   reviewed treatment of low blood glucose   Psychosocial adjustment Worked with patient to identify barriers to care and solutions      Individualized Goals (developed by patient)   Nutrition General guidelines for healthy choices and portions discussed    Physical Activity Exercise 3-5 times per week;30 minutes per day    Medications take my medication as prescribed    Monitoring  test my blood glucose as discussed    Reducing Risk Other (comment)   increase vegetables, decrease sugar     Patient Self-Evaluation of Goals - Patient rates self as meeting previously set goals (% of time)   Nutrition 50 - 75 %    Physical Activity Not Applicable    Medications >75%    Monitoring >75%    Problem Solving >75%    Reducing Risk >75%    Health Coping >75%      Post-Education Assessment   Patient understands the diabetes disease and treatment process. Demonstrates understanding / competency    Patient understands incorporating nutritional management into lifestyle. Needs Review    Patient understands using medications safely. Needs Review    Patient understands monitoring blood glucose, interpreting and using results Needs Review    Patient understands prevention, detection, and treatment of acute complications. Needs Review    Patient understands prevention, detection, and treatment of chronic complications. Demonstrates understanding / competency    Patient understands how to develop strategies to address psychosocial issues. Needs Review    Patient understands how to develop strategies to promote health/change behavior. Needs Review      Outcomes   Expected Outcomes Demonstrated interest in learning. Expect positive outcomes    Future DMSE 3-4 months    Program Status Not Completed            Individualized Plan for Diabetes Self-Management Training:   Learning Objective:  Patient will have a greater understanding of diabetes self-management. Patient education plan is to attend individual and/or group sessions per assessed needs and concerns.   Plan:   Patient Instructions  Be mindful of beverages.  Sugar containing beverages can make your blood sugar harder to control.  When you do drink them, what other carbohydrates and what portion sizes of carbohydrates do you have with the meal.  Aim for about 45 grams of carbohydrates each meal (1 cup pasta or rice is 45 grams of carbs).  Fill out the rest of the meal with nonstarchy vegetables such as green beans, salad, broccoli, cauliflower, squash, or others.  Increase your non starchy vegetables.  Aim for at least 1 vegetable per lunch and dinner.  Consider choosing 100% whole wheat or whole grain bread rather than white bread most of the time.  Continue to take your  medication as prescribed. Start Cardiac rehab when able.  Great job on changes made!   Expected Outcomes:  Demonstrated interest in learning. Expect positive outcomes  Education material provided:   If problems or questions, patient to contact team via:  Phone  Future DSME appointment: 3-4 months

## 2020-06-05 NOTE — Patient Instructions (Addendum)
Be mindful of beverages.  Sugar containing beverages can make your blood sugar harder to control.  When you do drink them, what other carbohydrates and what portion sizes of carbohydrates do you have with the meal.  Aim for about 45 grams of carbohydrates each meal (1 cup pasta or rice is 45 grams of carbs).  Fill out the rest of the meal with nonstarchy vegetables such as green beans, salad, broccoli, cauliflower, squash, or others.  Increase your non starchy vegetables.  Aim for at least 1 vegetable per lunch and dinner.  Consider choosing 100% whole wheat or whole grain bread rather than white bread most of the time.  Continue to take your medication as prescribed. Start Cardiac rehab when able.  Great job on changes made!

## 2020-06-05 NOTE — Progress Notes (Deleted)
Name: Sarah [name]  Age/ Sex: 75 y.o., female   MRN/ DOB: [id number], [birth year]     PCP: [name], [name], MD   Reason for Endocrinology Evaluation: Type 2 Diabetes Mellitus  Initial Endocrine Consultative Visit: 08/15/2019    PATIENT IDENTIFIER: Sarah [name] is a 75 y.o. female with a past medical history of T2DM, CVA, Dyslipidemia  And CAD (S/P PCI 03/2020). The patient has followed with Endocrinology clinic since 08/15/2019 for consultative assistance with management of her diabetes.  DIABETIC HISTORY:  Sarah [name] was diagnosed with DM many years ago. Her hemoglobin A1c has ranged from 10.3% in 2020, peaking at 16.3% in 2016.   On her initial visit to our clinic she had an A1c of 12.5 % She was on MDI regimen but was not taking Metformin due to GI side effects so we stopped it. We adjusted MDI regimen and continued Victoza    Started Farxiga 02/2020    Lives with mother who is 21 yrs ago  SUBJECTIVE:   During the last visit (03/03/2020): A1c 14.3 %  continued victoza and adjusted MDI regimen , started farxiga       Today (06/05/2020): Sarah [name] is here for a follow up on diabetes management. She has not been to our clinic in 6 months.  She checks her blood sugars 2 times weekly . The patient has not had hypoglycemic episodes since the last clinic visit  Sarah [name] admits to medication non adherence    SOB is improving     HOME DIABETES REGIMEN:  Victoza 1.8 mg daily  Lantus  60 units daily  Humalog 15 units with each meal  Farxiga 5 mg daily      Statin: yes ACE-I/ARB: yes     CONTINUOUS GLUCOSE MONITORING RECORD INTERPRETATION    Dates of Recording:   Sensor description: Dexcom  Results statistics:   CGM use % of time   Average and SD   Time in range        %  % Time Above 180   % Time above 250   % Time Below target      Glycemic patterns summary:   Hyperglycemic episodes    Hypoglycemic episodes occurred  Overnight  periods:      DIABETIC COMPLICATIONS: Microvascular complications:   Retinopathy, neuropathy   Denies: CKD  Last Eye Exam: Completed 02/2019  Macrovascular complications:   CVA, CAD (S/P PCI 03/2020  Denies: PVD   HISTORY:  Past Medical History:  Past Medical History:  Diagnosis Date  . Bilateral carpal tunnel syndrome 03/27/2019  . Boils   . Glaucoma   . Heart murmur   . HTN (hypertension)   . Hx of adenomatous colonic polyps   . Hyperlipidemia   . Osteoporosis   . Pneumonia   . Stroke ([facility])   . Type II or unspecified type diabetes mellitus without mention of complication, uncontrolled    Past Surgical History:  Past Surgical History:  Procedure Laterality Date  . ABDOMINAL HYSTERECTOMY    . CARPAL TUNNEL RELEASE     right  . COLONOSCOPY  12-10-10   per Dr. Deatra Ina, clear, repeat in 7 yrs   . CORONARY STENT INTERVENTION N/A 03/11/2020   Procedure: CORONARY STENT INTERVENTION;  Surgeon: Jettie Booze, MD;  Location: [facility];  Service: Cardiovascular;  Laterality: N/A;  . INCISION AND DRAINAGE PERIRECTAL ABSCESS N/A 09/26/2015   Procedure: IRRIGATION AND DEBRIDEMENT PERIRECTAL ABSCESS;  Surgeon: Mickeal Skinner, MD;  Location: [facility];  Service: General;  Laterality: N/A;  . INTRAVASCULAR ULTRASOUND/IVUS N/A 03/11/2020   Procedure: Intravascular Ultrasound/IVUS;  Surgeon: Jettie Booze, MD;  Location: [facility];  Service: Cardiovascular;  Laterality: N/A;  . KNEE ARTHROSCOPY     right  . LEFT HEART CATH AND CORONARY ANGIOGRAPHY N/A 03/11/2020   Procedure: LEFT HEART CATH AND CORONARY ANGIOGRAPHY;  Surgeon: Jettie Booze, MD;  Location: [facility];  Service: Cardiovascular;  Laterality: N/A;  . LOOP RECORDER INSERTION N/A 10/19/2016   Procedure: LOOP RECORDER INSERTION;  Surgeon: Thompson Grayer, MD;  Location: [facility];  Service: Cardiovascular;  Laterality: N/A;  . ORIF ANKLE FRACTURE Right 03/24/2012    Procedure: OPEN REDUCTION INTERNAL FIXATION (ORIF) ANKLE FRACTURE;  Surgeon: Newt Minion, MD;  Location: [facility];  Service: Orthopedics;  Laterality: Right;  Open Reduction Internal Fixation Right Bimalleolar ankle fracture  . POLYPECTOMY    . TEE WITHOUT CARDIOVERSION N/A 10/18/2016   Procedure: TRANSESOPHAGEAL ECHOCARDIOGRAM (TEE);  Surgeon: Fay Records, MD;  [facility];  Service: Cardiovascular;  Laterality: N/A;  . TONSILLECTOMY      Social History:  reports that she quit smoking about 3 years ago. Her smoking use included cigarettes. She has never used smokeless tobacco. She reports current alcohol use. She reports that she does not use drugs. Family History:  Family History  Problem Relation Age of Onset  . Diabetes Mother   . Hypertension Mother   . Stroke Father   . Stroke Maternal Uncle   . Stroke Paternal Uncle   . Colon cancer Neg Hx   . Esophageal cancer Neg Hx   . Rectal cancer Neg Hx   . Stomach cancer Neg Hx      HOME MEDICATIONS: Allergies as of 06/05/2020   No Known Allergies     Medication List       Accurate as of Jun 05, 2020  7:07 AM. If you have any questions, ask your nurse or doctor.        acetaminophen 500 MG tablet Commonly known as: TYLENOL Take 1,000 mg by mouth every 6 (six) hours as needed for mild pain or headache.   amLODipine 10 MG tablet Commonly known as: NORVASC TAKE 1 TABLET(10 MG) BY MOUTH DAILY   aspirin EC 81 MG tablet Swallow whole. TAKE daily until after heart Cath What changed:   how much to take  how to take this  when to take this  additional instructions   atorvastatin 80 MG tablet Commonly known as: LIPITOR Take 1 tablet (80 mg total) by mouth daily at 6 PM.   calcium carbonate 750 MG chewable tablet Commonly known as: TUMS EX Chew 1 tablet by mouth as needed for heartburn.   clopidogrel 75 MG tablet Commonly known as: PLAVIX Take 1 tablet (75 mg total) by mouth daily.   dapagliflozin  propanediol 5 MG Tabs tablet Commonly known as: Farxiga Take 1 tablet (5 mg total) by mouth daily before breakfast.   Dexcom G6 Sensor Misc 1 Device by Does not apply route as directed.   Dexcom G6 Transmitter Misc 1 Device by Does not apply route as directed.   diclofenac Sodium 1 % Gel Commonly known as: Voltaren Apply 2 g topically 4 (four) times daily.   ezetimibe 10 MG tablet Commonly known as: Zetia Take 1 tablet (10 mg total) by mouth daily.   furosemide 20 MG tablet Commonly known as: LASIX TAKE 1 TABLET(20 MG)  BY MOUTH DAILY   HumaLOG KwikPen 200 UNIT/ML KwikPen Generic drug: insulin lispro Inject 18 Units into the skin with breakfast, with lunch, and with evening meal. 15 U 15 min before meals   Insulin Pen Needle 32G X 4 MM Misc 1 Device by Does not apply route in the morning, at noon, in the evening, and at bedtime.   Lantus SoloStar 100 UNIT/ML Solostar Pen Generic drug: insulin glargine Inject 60 Units into the skin daily.   losartan 50 MG tablet Commonly known as: COZAAR TAKE 1 TABLET(50 MG) BY MOUTH DAILY   metoprolol tartrate 25 MG tablet Commonly known as: LOPRESSOR Take 0.5 tablets (12.5 mg total) by mouth 2 (two) times daily.   nitroGLYCERIN 0.4 MG SL tablet Commonly known as: NITROSTAT Place 1 tablet (0.4 mg total) under the tongue every 5 (five) minutes as needed for chest pain. one tab every 5 minutes up to 3 tablets total over 15 minutes.   ONE TOUCH ULTRA TEST test strip Generic drug: glucose blood Check BS before and 2 hours after meals.   OT ULTRA/FASTTK CNTRL SOLN Soln   Pharmacist Choice Alcohol 70 % Misc Generic drug: Isopropyl Alcohol   [place] 0.02-0.005 % Soln Generic drug: Netarsudil-Latanoprost Place 1 drop into both eyes every evening.   sertraline 25 MG tablet Commonly known as: Zoloft Take 1 tablet (25 mg total) by mouth daily.   Simple Diagnostics Lancing Dev Misc Check BS before and 2 hours after meals.    Victoza 18 MG/3ML Sopn Generic drug: liraglutide Inject 1.8 mg into the skin daily. ADMINISTER 1.8 MG UNDER THE SKIN DAILY        OBJECTIVE:   Vital Signs: There were no vitals taken for this visit.  Wt Readings from Last 3 Encounters:  05/28/20 144 lb (65.3 kg)  04/18/20 132 lb 6 oz (60 kg)  03/20/20 132 lb (59.9 kg)     Exam: General: Sarah [name] appears well and is in NAD  Neck: General: Supple without adenopathy. Thyroid: Thyroid size normal.  No goiter or nodules appreciated.  Lungs: Clear with good BS bilat with no rales, rhonchi, or wheezes  Heart: RRR with normal S1 and S2 and no gallops; no murmurs; no rub  Abdomen: Normoactive bowel sounds, soft, nontender, without masses or organomegaly palpable  Extremities: No pretibial edema.   Neuro: MS is good with appropriate affect, Sarah [name] is alert and Ox3   DM foot exam: 8//04/2019  The skin of the feet is intact without sores or ulcerations. The pedal pulses are 1+ on right and 1+ on left. The sensation is decreased  to a screening 5.07, 10 gram monofilament bilaterally    DATA REVIEWED:  Lab Results  Component Value Date   HGBA1C 14.3 (A) 03/03/2020   HGBA1C 12.5 (H) 08/01/2019   HGBA1C 12.3 (A) 04/27/2019   Lab Results  Component Value Date   MICROALBUR 96.5 (H) 04/27/2019   LDLCALC 48 03/26/2020   CREATININE 0.65 03/04/2020   Lab Results  Component Value Date   MICRALBCREAT 116.0 (H) 04/27/2019     Lab Results  Component Value Date   CHOL 127 03/26/2020   HDL 62 03/26/2020   LDLCALC 48 03/26/2020   TRIG 93 03/26/2020   CHOLHDL 2.0 03/26/2020         ASSESSMENT / PLAN / RECOMMENDATIONS:   1) Type 2 Diabetes Mellitus, Poorly controlled, With neuropathic and  macrovascular  complications - Most recent A1c of 14.3 %. Goal A1c < 7.0 %.    -  Poorly controlled diabetes due to medication, non -adherence, dietary indiscretions and lack of glucose checks at home  - I am going to prescribed Dexcom  - We also  discussed add-on therapy with SGLT-2 inhibitors , cautioned against genital infections  - Will adjust prandial insulin, discussed importance of medication compliance - In office BG 322 mg/dL , she ate cheese , peaches 4 hours ago withOUT prandial insulin and had some sips of juice from her grandson.    MEDICATIONS: - Start Farxiga 5 mg, 1 tablet daily  - Continue Victoza 1.8 mg daily  - Continue  Lantus 60 units daily  - Increase Humalog to 18 units with each meal    EDUCATION / INSTRUCTIONS:  BG monitoring instructions: Patient is instructed to check her blood sugars 3 times a day, before meals   Call [facility] if: BG persistently < 70 . I reviewed the Rule of 15 for the treatment of hypoglycemia in detail with the patient. Literature supplied.   2) Diabetic complications:   Eye: there's a questionable diabetic retinopathy from her history  Neuro/ Feet: Does have known diabetic peripheral neuropathy .   Renal: Patient does not have known baseline CKD. She   is  on an ACEI/ARB at present.       F/U in 3 months    Signed electronically by: Mack Guise, MD  [facility] End[facility]in[facility]  [facility] [address] [place], [place] [address] Phone: [phone] FAX: [phone]   CC: [name], [name], [place] [address] Phone: [phone]  Fax: [phone]  Return to Endocrinology clinic as below: Future Appointments  Date Time Provider [place]  06/05/2020  9:45 AM [facility] DEVICE REMOTES [facility]  06/05/2020 11:10 AM Deloma Spindle, Melanie Crazier, MD [facility] None  06/05/2020 11:30 AM Clydell Hakim, RD [place] N[facility]  07/07/2020  9:45 AM [facility] DEVICE REMOTES [facility]  07/16/2020  3:00 PM [name], [name], MD [facility]  08/07/2020  9:45 AM [facility] DEVICE REMOTES [facility]  09/08/2020  9:45 AM [facility] DEVICE REMOTES  [facility]  09/22/2020  9:20 AM Werner Lean, MD [facility]  10/09/2020  9:45 AM [facility] DEVICE REMOTES [facility]  10/30/2020  2:00 PM [facility] DX DEXA 1 [facility]  11/10/2020  9:45 AM [facility] DEVICE REMOTES [facility]  12/11/2020  9:45 AM [facility] DEVICE REMOTES [facility]  02/12/2021  9:45 AM [facility] DEVICE REMOTES [facility]  03/16/2021  9:45 AM [facility] DEVICE REMOTES [facility]  04/16/2021  9:45 AM [facility] DEVICE REMOTES [facility]  05/18/2021  9:45 AM [facility] DEVICE REMOTES [facility]  06/18/2021  9:45 AM [facility] DEVICE REMOTES [facility]  07/20/2021  9:45 AM [facility] DEVICE REMOTES [facility]  08/20/2021  9:45 AM [facility] DEVICE REMOTES [facility]  09/21/2021  9:45 AM [facility] DEVICE REMOTES [facility]  10/22/2021  9:45 AM [facility] DEVICE REMOTES [facility]  11/23/2021  9:45 AM [facility] DEVICE REMOTES [facility]  12/24/2021  9:45 AM [facility] DEVICE REMOTES [facility]

## 2020-06-06 ENCOUNTER — Telehealth: Payer: Self-pay | Admitting: *Deleted

## 2020-06-06 ENCOUNTER — Ambulatory Visit (HOSPITAL_COMMUNITY): Payer: Medicare Other

## 2020-06-06 DIAGNOSIS — E1121 Type 2 diabetes mellitus with diabetic nephropathy: Secondary | ICD-10-CM

## 2020-06-06 MED ORDER — VICTOZA 18 MG/3ML ~~LOC~~ SOPN: 1.8000 mg | PEN_INJECTOR | Freq: Every day | SUBCUTANEOUS | 3 refills | Status: DC

## 2020-06-06 NOTE — Telephone Encounter (Signed)
Refill sent as requested. 

## 2020-06-06 NOTE — Telephone Encounter (Signed)
-----   Message from Clydell Hakim, RD sent at 06/05/2020 12:09 PM EDT ----- Regarding: prescription Ms. Tineo needs a prescription sent in for Victoza.  She has been out for the past 2 days.  She has been on this medication for a while and insurance has covered this but recently it was denied.  She thinks that it is due to how it was ordered and went through a different part of her policy.  [facility] [address]  She sees Dr. Kelton Pillar 06/11/2020.  Thank you,  Antonieta Iba, RD, LDN, CDCES

## 2020-06-07 ENCOUNTER — Other Ambulatory Visit: Payer: Self-pay | Admitting: Family Medicine

## 2020-06-07 DIAGNOSIS — I639 Cerebral infarction, unspecified: Secondary | ICD-10-CM

## 2020-06-08 ENCOUNTER — Other Ambulatory Visit: Payer: Self-pay | Admitting: Family Medicine

## 2020-06-08 DIAGNOSIS — F419 Anxiety disorder, unspecified: Secondary | ICD-10-CM

## 2020-06-11 ENCOUNTER — Ambulatory Visit (HOSPITAL_COMMUNITY): Payer: Medicare Other

## 2020-06-11 ENCOUNTER — Ambulatory Visit: Payer: Medicare Other | Admitting: Internal Medicine

## 2020-06-13 ENCOUNTER — Other Ambulatory Visit: Payer: Self-pay

## 2020-06-13 ENCOUNTER — Ambulatory Visit (INDEPENDENT_AMBULATORY_CARE_PROVIDER_SITE_OTHER): Payer: Medicare Other | Admitting: Internal Medicine

## 2020-06-13 ENCOUNTER — Ambulatory Visit (HOSPITAL_COMMUNITY): Payer: Medicare Other

## 2020-06-13 VITALS — BP 138/72 | HR 95 | Ht 60.0 in | Wt 138.5 lb

## 2020-06-13 DIAGNOSIS — E1165 Type 2 diabetes mellitus with hyperglycemia: Secondary | ICD-10-CM

## 2020-06-13 DIAGNOSIS — Z794 Long term (current) use of insulin: Secondary | ICD-10-CM | POA: Diagnosis not present

## 2020-06-13 DIAGNOSIS — E1121 Type 2 diabetes mellitus with diabetic nephropathy: Secondary | ICD-10-CM | POA: Diagnosis not present

## 2020-06-13 LAB — POCT GLYCOSYLATED HEMOGLOBIN (HGB A1C): Hemoglobin A1C: 8.9 % — AB (ref 4.0–5.6)

## 2020-06-13 MED ORDER — VICTOZA 18 MG/3ML ~~LOC~~ SOPN
1.8000 mg | PEN_INJECTOR | Freq: Every day | SUBCUTANEOUS | 3 refills | Status: DC
Start: 2020-06-13 — End: 2021-05-01

## 2020-06-13 MED ORDER — DAPAGLIFLOZIN PROPANEDIOL 10 MG PO TABS: 10.0000 mg | ORAL_TABLET | Freq: Every day | ORAL | 3 refills | Status: DC

## 2020-06-13 MED ORDER — HUMALOG KWIKPEN 200 UNIT/ML ~~LOC~~ SOPN: PEN_INJECTOR | SUBCUTANEOUS | 3 refills | Status: DC

## 2020-06-13 MED ORDER — LANTUS SOLOSTAR 100 UNIT/ML ~~LOC~~ SOPN: 64.0000 [IU] | PEN_INJECTOR | Freq: Every day | SUBCUTANEOUS | 3 refills | Status: DC

## 2020-06-13 NOTE — Patient Instructions (Addendum)
-   Keep Up the good Work ! - Increase Farxiga to 10 mg, 1 tablet daily  - Continue Victoza 1.8 mg daily  - Increase Lantus to 64 units daily  - Change  Humalog to 14 units with Breakfast, 14 units with Lunch and 18 units with Supper       HOW TO TREAT LOW BLOOD SUGARS (Blood sugar LESS THAN 70 MG/DL)  Please follow the RULE OF 15 for the treatment of hypoglycemia treatment (when your (blood sugars are less than 70 mg/dL)    STEP 1: Take 15 grams of carbohydrates when your blood sugar is low, which includes:   3-4 GLUCOSE TABS  OR  3-4 OZ OF JUICE OR REGULAR SODA OR  ONE TUBE OF GLUCOSE GEL     STEP 2: RECHECK blood sugar in 15 MINUTES STEP 3: If your blood sugar is still low at the 15 minute recheck --> then, go back to STEP 1 and treat AGAIN with another 15 grams of carbohydrates.

## 2020-06-13 NOTE — Progress Notes (Signed)
Name: Sarah [name]  Age/ Sex: 75 y.o., female   MRN/ DOB: [id number], 08/21/1945     PCP: [name], [name], MD   Reason for Endocrinology Evaluation: Type 2 Diabetes Mellitus  Initial Endocrine Consultative Visit: 08/15/2019    PATIENT IDENTIFIER: Sarah [name] is a 75 y.o. female with a past medical history of T2DM, CVA, Dyslipidemia  And CAD (S/P PCI 03/2020). The patient has followed with Endocrinology clinic since 08/15/2019 for consultative assistance with management of her diabetes.  DIABETIC HISTORY:  Sarah [name] was diagnosed with DM many years ago. Her hemoglobin A1c has ranged from 10.3% in 2020, peaking at 16.3% in 2016.   On her initial visit to our clinic she had an A1c of 12.5 % She was on MDI regimen but was not taking Metformin due to GI side effects so we stopped it. We adjusted MDI regimen and continued Victoza    Started Farxiga 02/2020    Lives with mother who is 68 yrs ago  SUBJECTIVE:   During the last visit (03/03/2020): A1c 14.3 %  continued victoza and adjusted MDI regimen , started farxiga       Today (06/13/2020): Sarah [name] is here for a follow up on diabetes management. She has not been to our clinic in 6 months.  She checks her blood sugars 2 times weekly . The patient has not had hypoglycemic episodes since the last clinic visit   Forgot to take humalog with lunch today     HOME DIABETES REGIMEN:  Victoza 1.8 mg daily  Lantus  60 units daily  Humalog 18 units with each meal  Farxiga 5 mg daily      Statin: yes ACE-I/ARB: yes     CONTINUOUS GLUCOSE MONITORING RECORD INTERPRETATION    Dates of Recording: 5/21-06/13/2020  Sensor description: Dexcom  Results statistics:   CGM use % of time 100  Average and SD 219/63  Time in range 20 %  % Time Above 180 52  % Time above 250 27  % Time Below target <1     Glycemic patterns summary: Hyperglycemia all day and night, but worse during the day   Hyperglycemic  episodes  All day and night   Hypoglycemic episodes occurred during the day , post bolus   Overnight periods: trends down but remains high      DIABETIC COMPLICATIONS: Microvascular complications:   Retinopathy, neuropathy   Denies: CKD  Last Eye Exam: Completed 02/2019  Macrovascular complications:   CVA, CAD (S/P PCI 03/2020  Denies: PVD   HISTORY:  Past Medical History:  Past Medical History:  Diagnosis Date  . Bilateral carpal tunnel syndrome 03/27/2019  . Boils   . Glaucoma   . Heart murmur   . HTN (hypertension)   . Hx of adenomatous colonic polyps   . Hyperlipidemia   . Osteoporosis   . Pneumonia   . Stroke ([place])   . Type II or unspecified type diabetes mellitus without mention of complication, uncontrolled    Past Surgical History:  Past Surgical History:  Procedure Laterality Date  . ABDOMINAL HYSTERECTOMY    . CARPAL TUNNEL RELEASE     right  . COLONOSCOPY  12-10-10   per Dr. Deatra Ina, clear, repeat in 7 yrs   . CORONARY STENT INTERVENTION N/A 03/11/2020   Procedure: CORONARY STENT INTERVENTION;  Surgeon: Jettie Booze, MD;  Location: [facility];  Service: Cardiovascular;  Laterality: N/A;  . INCISION AND DRAINAGE PERIRECTAL ABSCESS N/A  09/26/2015   Procedure: IRRIGATION AND DEBRIDEMENT PERIRECTAL ABSCESS;  Surgeon: Mickeal Skinner, MD;  Location: [facility];  Service: General;  Laterality: N/A;  . INTRAVASCULAR ULTRASOUND/IVUS N/A 03/11/2020   Procedure: Intravascular Ultrasound/IVUS;  Surgeon: Jettie Booze, MD;  Location: [facility];  Service: Cardiovascular;  Laterality: N/A;  . KNEE ARTHROSCOPY     right  . LEFT HEART CATH AND CORONARY ANGIOGRAPHY N/A 03/11/2020   Procedure: LEFT HEART CATH AND CORONARY ANGIOGRAPHY;  Surgeon: Jettie Booze, MD;  Location: [facility];  Service: Cardiovascular;  Laterality: N/A;  . LOOP RECORDER INSERTION N/A 10/19/2016   Procedure: LOOP RECORDER INSERTION;  Surgeon: Thompson Grayer, MD;  Location: [facility];  Service: Cardiovascular;  Laterality: N/A;  . ORIF ANKLE FRACTURE Right 03/24/2012   Procedure: OPEN REDUCTION INTERNAL FIXATION (ORIF) ANKLE FRACTURE;  Surgeon: Newt Minion, MD;  Location: [facility];  Service: Orthopedics;  Laterality: Right;  Open Reduction Internal Fixation Right Bimalleolar ankle fracture  . POLYPECTOMY    . TEE WITHOUT CARDIOVERSION N/A 10/18/2016   Procedure: TRANSESOPHAGEAL ECHOCARDIOGRAM (TEE);  Surgeon: Fay Records, MD;  Location: [facility];  Service: Cardiovascular;  Laterality: N/A;  . TONSILLECTOMY      Social History:  reports that she quit smoking about 3 years ago. Her smoking use included cigarettes. She has never used smokeless tobacco. She reports current alcohol use. She reports that she does not use drugs. Family History:  Family History  Problem Relation Age of Onset  . Diabetes Mother   . Hypertension Mother   . Stroke Father   . Stroke Maternal Uncle   . Stroke Paternal Uncle   . Colon cancer Neg Hx   . Esophageal cancer Neg Hx   . Rectal cancer Neg Hx   . Stomach cancer Neg Hx      HOME MEDICATIONS: Allergies as of 06/13/2020   No Known Allergies     Medication List       Accurate as of June 13, 2020 12:53 PM. If you have any questions, ask your nurse or doctor.        acetaminophen 500 MG tablet Commonly known as: TYLENOL Take 1,000 mg by mouth every 6 (six) hours as needed for mild pain or headache.   amLODipine 10 MG tablet Commonly known as: NORVASC TAKE 1 TABLET(10 MG) BY MOUTH DAILY   aspirin EC 81 MG tablet Swallow whole. TAKE daily until after heart Cath What changed:   how much to take  how to take this  when to take this  additional instructions   atorvastatin 80 MG tablet Commonly known as: LIPITOR TAKE 1 TABLET(80 MG) BY MOUTH DAILY AT 6 PM   calcium carbonate 750 MG chewable tablet Commonly known as: TUMS EX Chew 1 tablet by mouth as needed for heartburn.    clopidogrel 75 MG tablet Commonly known as: PLAVIX Take 1 tablet (75 mg total) by mouth daily.   dapagliflozin propanediol 5 MG Tabs tablet Commonly known as: Farxiga Take 1 tablet (5 mg total) by mouth daily before breakfast.   Dexcom G6 Sensor Misc 1 Device by Does not apply route as directed.   Dexcom G6 Transmitter Misc 1 Device by Does not apply route as directed.   diclofenac Sodium 1 % Gel Commonly known as: Voltaren Apply 2 g topically 4 (four) times daily.   ezetimibe 10 MG tablet Commonly known as: Zetia Take 1 tablet (10 mg total) by mouth daily.  furosemide 20 MG tablet Commonly known as: LASIX TAKE 1 TABLET(20 MG) BY MOUTH DAILY   HumaLOG KwikPen 200 UNIT/ML KwikPen Generic drug: insulin lispro Inject 18 Units into the skin with breakfast, with lunch, and with evening meal. 15 U 15 min before meals   Insulin Pen Needle 32G X 4 MM Misc 1 Device by Does not apply route in the morning, at noon, in the evening, and at bedtime.   Lantus SoloStar 100 UNIT/ML Solostar Pen Generic drug: insulin glargine Inject 60 Units into the skin daily.   losartan 50 MG tablet Commonly known as: COZAAR TAKE 1 TABLET(50 MG) BY MOUTH DAILY   metoprolol tartrate 25 MG tablet Commonly known as: LOPRESSOR Take 0.5 tablets (12.5 mg total) by mouth 2 (two) times daily.   nitroGLYCERIN 0.4 MG SL tablet Commonly known as: NITROSTAT Place 1 tablet (0.4 mg total) under the tongue every 5 (five) minutes as needed for chest pain. one tab every 5 minutes up to 3 tablets total over 15 minutes.   ONE TOUCH ULTRA TEST test strip Generic drug: glucose blood Check BS before and 2 hours after meals.   OT ULTRA/FASTTK CNTRL SOLN Soln   Pharmacist Choice Alcohol 70 % Misc Generic drug: Isopropyl Alcohol   Rocklatan 0.02-0.005 % Soln Generic drug: Netarsudil-Latanoprost Place 1 drop into both eyes every evening.   sertraline 25 MG tablet Commonly known as: ZOLOFT TAKE 1 TABLET(25  MG) BY MOUTH DAILY   Simple Diagnostics Lancing Dev Misc Check BS before and 2 hours after meals.   Victoza 18 MG/3ML Sopn Generic drug: liraglutide Inject 1.8 mg into the skin daily.        OBJECTIVE:   Vital Signs: BP 138/72   Pulse 95   Ht 5' (1.524 m)   Wt 138 lb 8 oz (62.8 kg)   SpO2 98%   BMI 27.05 kg/m   Wt Readings from Last 3 Encounters:  05/28/20 144 lb (65.3 kg)  04/18/20 132 lb 6 oz (60 kg)  03/20/20 132 lb (59.9 kg)     Exam: General: Pt appears well and is in NAD  Lungs: Clear with good BS bilat with no rales, rhonchi, or wheezes  Heart: RRR   Extremities: No pretibial edema.   Neuro: MS is good with appropriate affect, pt is alert and Ox3   DM foot exam: 8//04/2019  The skin of the feet is intact without sores or ulcerations. The pedal pulses are 1+ on right and 1+ on left. The sensation is decreased  to a screening 5.07, 10 gram monofilament bilaterally    DATA REVIEWED:  Lab Results  Component Value Date   HGBA1C 14.3 (A) 03/03/2020   HGBA1C 12.5 (H) 08/01/2019   HGBA1C 12.3 (A) 04/27/2019   Results for KIMERLY, [name] (MRN [id number]) as of 06/13/2020 15:46  Ref. Range 03/04/2020 17:40  BASIC METABOLIC PANEL Unknown Rpt (A)  Sodium Latest Ref Range: 134 - 144 mmol/L 137  Potassium Latest Ref Range: 3.5 - 5.2 mmol/L 4.2  Chloride Latest Ref Range: 96 - 106 mmol/L 102  CO2 Latest Ref Range: 20 - 29 mmol/L 21  Glucose Latest Ref Range: 65 - 99 mg/dL 375 (H)  BUN Latest Ref Range: 8 - 27 mg/dL 16  Creatinine Latest Ref Range: 0.57 - 1.00 mg/dL 0.65  Calcium Latest Ref Range: 8.7 - 10.3 mg/dL 9.7  BUN/Creatinine Ratio Latest Ref Range: 12 - 28  25    ASSESSMENT / PLAN / RECOMMENDATIONS:   1) Type  2 Diabetes Mellitus, Poorly controlled, With neuropathic and  macrovascular  complications - Most recent A1c of 8.9 %. Goal A1c < 7.0 %.    - A1c dow from 14.3% I have encouraged her to continue with glucose check and medication compliance   - She is liking Dexcom  - Will make the following changes   MEDICATIONS: - Increase Farxiga to 10 mg, 1 tablet daily  - Continue Victoza 1.8 mg daily  - Increase Lantus to 64 units daily  - Change  Humalog to 14 units with Breakfast, 14 units with Lunch and 18 units with Supper    EDUCATION / INSTRUCTIONS:  BG monitoring instructions: Patient is instructed to check her blood sugars 3 times a day, before meals   Call [facility] if: BG persistently < 70 . I reviewed the Rule of 15 for the treatment of hypoglycemia in detail with the patient. Literature supplied.   2) Diabetic complications:   Eye: there's a questionable diabetic retinopathy from her history  Neuro/ Feet: Does have known diabetic peripheral neuropathy .   Renal: Patient does not have known baseline CKD. She   is  on an ACEI/ARB at present.       F/U in 4 months    Signed electronically by: Mack Guise, MD  [facility]  [facility] [address] [place], [place] [address] Phone: [phone] FAX: [phone]   CC: [name], [name], [place] [address] Phone: [phone]  Fax: [phone]  Return to Endocrinology clinic as below: Future Appointments  Date Time Provider [place]  06/13/2020  3:20 PM Jhania Etherington, Melanie Crazier, MD [facility] None  06/16/2020  8:00 AM [name], [name], MD [facility]  07/07/2020  9:45 AM [facility] DEVICE REMOTES [facility]  08/07/2020  9:45 AM [facility] DEVICE REMOTES [facility]  09/08/2020  9:45 AM [facility] DEVICE REMOTES [facility]  09/22/2020  9:20 AM Werner Lean, MD [facility]  10/09/2020  9:45 AM [facility] DEVICE REMOTES [facility]  10/30/2020  2:00 PM [facility] DX DEXA 1 [facility]  11/10/2020  9:45 AM [facility] DEVICE REMOTES [facility]  12/11/2020   9:45 AM [facility] DEVICE REMOTES [facility]  02/12/2021  9:45 AM [facility] DEVICE REMOTES [facility]  03/16/2021  9:45 AM [facility] DEVICE REMOTES [facility]  04/16/2021  9:45 AM [facility] DEVICE REMOTES [facility]  05/18/2021  9:45 AM [facility] DEVICE REMOTES [facility]  06/18/2021  9:45 AM [facility] DEVICE REMOTES [facility]  07/20/2021  9:45 AM [facility] DEVICE REMOTES [facility]  08/20/2021  9:45 AM [facility] DEVICE REMOTES [facility]  09/21/2021  9:45 AM [facility] DEVICE REMOTES [facility]  10/22/2021  9:45 AM [facility] DEVICE REMOTES [facility]  11/23/2021  9:45 AM [facility] DEVICE REMOTES [facility]  12/24/2021  9:45 AM [facility] DEVICE REMOTES [facility]

## 2020-06-16 ENCOUNTER — Encounter (HOSPITAL_COMMUNITY): Payer: Medicare Other

## 2020-06-16 ENCOUNTER — Encounter: Payer: Self-pay | Admitting: Family Medicine

## 2020-06-16 ENCOUNTER — Ambulatory Visit (HOSPITAL_COMMUNITY): Payer: Medicare Other

## 2020-06-16 ENCOUNTER — Ambulatory Visit (INDEPENDENT_AMBULATORY_CARE_PROVIDER_SITE_OTHER): Payer: Medicare Other | Admitting: Family Medicine

## 2020-06-16 ENCOUNTER — Other Ambulatory Visit: Payer: Self-pay

## 2020-06-16 VITALS — BP 126/80 | HR 91 | Resp 16 | Ht 60.0 in | Wt 141.1 lb

## 2020-06-16 DIAGNOSIS — M25562 Pain in left knee: Secondary | ICD-10-CM | POA: Diagnosis not present

## 2020-06-16 DIAGNOSIS — I119 Hypertensive heart disease without heart failure: Secondary | ICD-10-CM

## 2020-06-16 DIAGNOSIS — M79605 Pain in left leg: Secondary | ICD-10-CM | POA: Diagnosis not present

## 2020-06-16 DIAGNOSIS — E785 Hyperlipidemia, unspecified: Secondary | ICD-10-CM | POA: Diagnosis not present

## 2020-06-16 NOTE — Patient Instructions (Addendum)
A few things to remember from today's visit:   Pain of left lower extremity - Plan: VAS [place] LOWER EXTREMITY VENOUS (DVT)  Hypertension with heart disease  Hyperlipidemia LDL goal <70  Acute pain of left knee - Plan: Ambulatory referral to Orthopedic Surgery  If you need refills please call your pharmacy. Do not use My Chart to request refills or for acute issues that need immediate attention.   Test will be arranged to evaluate for blood clots.  Appt with ortho will be arranged. Move follow up appt for 11/2020. No changes today.  Please be sure medication list is accurate. If a new problem present, please set up appointment sooner than planned today.

## 2020-06-16 NOTE — Progress Notes (Signed)
HPI: [name] is a 75 y.o. female with hx of CAD,CVA,HTN,HLD,DM II,and anxiety here today for follow up.   She was last seen on 02/25/20.  Left knee pain: Evaluated for this problem on 05/28/20. Pain started after she felt like left knee gave way and locked, she almost fell while going down stairs.  + Effusion, limitation of ROM. She has not noted erythema.  On 05/30/20 she had knee,ankle,and foot X ray.  Knee X ray: Osteoarthritic change with narrowing medially and in the patellofemoral joint. There is chondrocalcinosis which may be seen with osteoarthritis or calcium pyrophosphate deposition disease. No fracture, dislocation, or joint effusion. Sclerotic area in the distal femoral diaphysis likely represents bone infarct or potential enchondroma. This area of sclerosis extends over approximately 4.5 x 2.6 cm.  Left ankle: Inferior calcaneal spur. No fracture or appreciable joint space narrowing. Ankle mortise appears intact.  Left foot: Osteoporosis. Soft tissue swelling dorsally. No appreciable fracture or dislocation. Inferior calcaneal spur. No appreciable joint space narrowing or erosion.  Pain is "not terrible" but it is constant. Also having pain in lateral aspect of left thigh and calf. She is concerned about blood clot.  She is taking Aleve. Edema has improved, no erythema. Limitation of ADL's due to pain. Dull pain, 5/10.  Since her last visit she has seen her endocrinologist and nutritionist.  HTN: She is not checking BP at home. CAD, s/p cardiac cath and stent placement on 03/11/20. She is following with cardiologist.  Negative for severe/frequent headache, visual changes, chest pain, palpitation, new focal weakness, or worsening edema. Occasionally SOB, stable for a while.  Lab Results  Component Value Date   CREATININE 0.65 03/04/2020   BUN 16 03/04/2020   NA 137 03/04/2020   K 4.2 03/04/2020   CL 102 03/04/2020   CO2 21 03/04/2020   HLD:  She is on Atorvastatin 80 mg daily and Zetia 10 mg. She is tolerating medication well.  Lab Results  Component Value Date   CHOL 127 03/26/2020   HDL 62 03/26/2020   LDLCALC 48 03/26/2020   TRIG 93 03/26/2020   CHOLHDL 2.0 03/26/2020   Review of Systems  Constitutional:  Positive for activity change and fatigue. Negative for appetite change and fever.  HENT:  Negative for mouth sores, nosebleeds, sore throat and trouble swallowing.   Respiratory:  Negative for cough and wheezing.   Gastrointestinal:  Negative for abdominal pain, nausea and vomiting.       Negative for changes in bowel habits.  Genitourinary:  Negative for decreased urine volume and hematuria.  Musculoskeletal:  Positive for arthralgias and gait problem.  Skin:  Negative for rash.  Neurological:  Negative for syncope, facial asymmetry and weakness.  Rest of ROS, see pertinent positives sand negatives in HPI  Current Outpatient Medications on File Prior to Visit  Medication Sig Dispense Refill   acetaminophen (TYLENOL) 500 MG tablet Take 1,000 mg by mouth every 6 (six) hours as needed for mild pain or headache.     amLODipine (NORVASC) 10 MG tablet TAKE 1 TABLET(10 MG) BY MOUTH DAILY 90 tablet 1   aspirin EC 81 MG tablet Swallow whole. TAKE daily until after heart Cath (Patient taking differently: Take 81 mg by mouth daily.) 30 tablet 11   atorvastatin (LIPITOR) 80 MG tablet TAKE 1 TABLET(80 MG) BY MOUTH DAILY AT 6 PM 90 tablet 2   Blood Glucose Calibration (OT ULTRA/FASTTK CNTRL SOLN) SOLN      calcium carbonate (  TUMS EX) 750 MG chewable tablet Chew 1 tablet by mouth as needed for heartburn.     clopidogrel (PLAVIX) 75 MG tablet Take 1 tablet (75 mg total) by mouth daily. 90 tablet 1   Continuous Blood Gluc Sensor (DEXCOM G6 SENSOR) MISC 1 Device by Does not apply route as directed. 3 each 11   Continuous Blood Gluc Transmit (DEXCOM G6 TRANSMITTER) MISC 1 Device by Does not apply route as directed. 1 each 3    dapagliflozin propanediol (FARXIGA) 10 MG TABS tablet Take 1 tablet (10 mg total) by mouth daily. 90 tablet 3   diclofenac Sodium (VOLTAREN) 1 % GEL Apply 2 g topically 4 (four) times daily. 150 g 3   ezetimibe (ZETIA) 10 MG tablet Take 1 tablet (10 mg total) by mouth daily. 90 tablet 3   furosemide (LASIX) 20 MG tablet TAKE 1 TABLET(20 MG) BY MOUTH DAILY 30 tablet 2   insulin glargine (LANTUS SOLOSTAR) 100 UNIT/ML Solostar Pen Inject 64 Units into the skin daily. 60 mL 3   insulin lispro (HUMALOG KWIKPEN) 200 UNIT/ML KwikPen Inject 14 Units into the skin 2 (two) times daily with breakfast and lunch AND 18 Units daily with supper. 15 U 15 min before meals. 30 mL 3   Insulin Pen Needle 32G X 4 MM MISC 1 Device by Does not apply route in the morning, at noon, in the evening, and at bedtime. 400 each 3   Lancet Devices (SIMPLE DIAGNOSTICS LANCING DEV) MISC Check BS before and 2 hours after meals. 200 each 6   liraglutide (VICTOZA) 18 MG/3ML SOPN Inject 1.8 mg into the skin daily. 9 mL 3   losartan (COZAAR) 50 MG tablet TAKE 1 TABLET(50 MG) BY MOUTH DAILY 90 tablet 1   metoprolol tartrate (LOPRESSOR) 25 MG tablet Take 0.5 tablets (12.5 mg total) by mouth 2 (two) times daily. 90 tablet 3   nitroGLYCERIN (NITROSTAT) 0.4 MG SL tablet Place 1 tablet (0.4 mg total) under the tongue every 5 (five) minutes as needed for chest pain. one tab every 5 minutes up to 3 tablets total over 15 minutes. 25 tablet 3   ONE TOUCH ULTRA TEST test strip Check BS before and 2 hours after meals. 200 each 6   PHARMACIST CHOICE ALCOHOL 70 % PADS      ROCKLATAN 0.02-0.005 % SOLN Place 1 drop into both eyes every evening.     sertraline (ZOLOFT) 25 MG tablet TAKE 1 TABLET(25 MG) BY MOUTH DAILY 30 tablet 2   No current facility-administered medications on file prior to visit.     Past Medical History:  Diagnosis Date   Bilateral carpal tunnel syndrome 03/27/2019   Boils    Glaucoma    Heart murmur    HTN (hypertension)     Hx of adenomatous colonic polyps    Hyperlipidemia    Osteoporosis    Pneumonia    Stroke ([place])    Type II or unspecified type diabetes mellitus without mention of complication, uncontrolled    No Known Allergies  Social History   Socioeconomic History   Marital status: Single    Spouse name: Not on file   Number of children: Not on file   Years of education: Not on file   Highest education level: Not on file  Occupational History   Not on file  Tobacco Use   Smoking status: Former    Pack years: 0.00    Types: Cigarettes    Quit date: 10/15/2016  Years since quitting: 3.6   Smokeless tobacco: Never   Tobacco comments:    smokes occ.   Vaping Use   Vaping Use: Never used  Substance and Sexual Activity   Alcohol use: Yes    Alcohol/week: 0.0 standard drinks    Comment: occ   Drug use: No   Sexual activity: Not on file  Other Topics Concern   Not on file  Social History Narrative   Not on file   Social Determinants of Health   Financial Resource Strain: Low Risk    Difficulty of Paying Living Expenses: Not hard at all  Food Insecurity: No Food Insecurity   Worried About Charity fundraiser in the Last Year: Never true   [place] in the Last Year: Never true  Transportation Needs: No Transportation Needs   Lack of Transportation (Medical): No   Lack of Transportation (Non-Medical): No  Physical Activity: Inactive   Days of Exercise per Week: 0 days   Minutes of Exercise per Session: 0 min  Stress: No Stress Concern Present   Feeling of Stress : Not at all  Social Connections: Socially Isolated   Frequency of Communication with Friends and Family: More than three times a week   Frequency of Social Gatherings with Friends and Family: More than three times a week   Attends Religious Services: Never   Marine scientist or Organizations: No   Attends Archivist Meetings: Never   Marital Status: Divorced    Vitals:   06/16/20 0754  BP:  126/80  Pulse: 91  Resp: 16  SpO2: 97%   Body mass index is 27.56 kg/m.  Physical Exam Vitals and nursing note reviewed.  Constitutional:      General: She is not in acute distress.    Appearance: She is well-developed. She is not ill-appearing.  HENT:     Head: Normocephalic and atraumatic.     Mouth/Throat:     Mouth: Mucous membranes are moist.     Dentition: Abnormal dentition.     Pharynx: Oropharynx is clear.  Eyes:     Conjunctiva/sclera: Conjunctivae normal.  Cardiovascular:     Rate and Rhythm: Normal rate and regular rhythm.     Pulses:          Dorsalis pedis pulses are 2+ on the right side and 2+ on the left side.  Pulmonary:     Effort: Pulmonary effort is normal. No respiratory distress.  Abdominal:     Palpations: Abdomen is soft.     Tenderness: There is no abdominal tenderness.  Musculoskeletal:     Left knee: Effusion present. No bony tenderness or crepitus. Decreased range of motion. Tenderness present over the medial joint line. No patellar tendon tenderness.       Legs:     Comments: Left knee:Valgus and varus stress normal,  anterior and posterior drawer test negative.  Pain upon palpation of medial aspect of left calf, no pain elicited with foot dorsiflexion,no erythema. Left calf with mildly bigger circumference. Also pain on lateral aspect of distal thigh.  Limping.  Skin:    General: Skin is warm.     Findings: No erythema or rash.  Neurological:     Mental Status: She is alert and oriented to person, place, and time.     Comments: Mildly unstable gait, antalgic,not assisted.  Psychiatric:        Mood and Affect: Mood is anxious.  ASSESSMENT AND PLAN:  Ms.  ALICYA BENA was seen today for follow-up.  Orders Placed This Encounter  Procedures   Ambulatory referral to Orthopedic Surgery   VAS [place] LOWER EXTREMITY VENOUS (DVT)   Acute pain of left knee Minimal improvement. Possible causes discussed, ? Meniscal injury. There are  some finding reported on knee X ray that I think should be further evaluated,sclerotic area of distal femoral diaphysis.  Ortho referral placed.  DEXA has been ordered. Fall precautions discussed.  Pain of left lower extremity Examination today does not suggest DVT, she is very concerned about the possibility having one. LE [place] arranged for today, she cancelled and re-scheduled for tomorrow. ? Muscle strain. Instructed about warning signs. Caution with NSAID's.  Hypertension with heart disease BP adequately controlled. Continue Losartan,Metoprolol tartrate,and Amlodipine same dose. Low salt diet. Monitor BP at home.  Hyperlipidemia LDL goal <70 LDL at goal. Continue Atorvastatin 80 mg daily and Zetia 10 mg daily.  Return in about 5 months (around 11/16/2020) for Please move appt from 07/2020 to 11/2020.Marland Kitchen  Shivan Hodes G. [name], MD  [facility]. [place] office.    A few things to remember from today's visit:   Pain of left lower extremity - Plan: VAS [place] LOWER EXTREMITY VENOUS (DVT)  Hypertension with heart disease  Hyperlipidemia LDL goal <70  Acute pain of left knee - Plan: Ambulatory referral to Orthopedic Surgery  If you need refills please call your pharmacy. Do not use My Chart to request refills or for acute issues that need immediate attention.   Test will be arranged to evaluate for blood clots.  Appt with ortho will be arranged. Move follow up appt for 11/2020. No changes today.  Please be sure medication list is accurate. If a new problem present, please set up appointment sooner than planned today.

## 2020-06-17 ENCOUNTER — Ambulatory Visit (HOSPITAL_COMMUNITY)
Admission: RE | Admit: 2020-06-17 | Discharge: 2020-06-17 | Disposition: A | Discharge status: Home / Self Care | Payer: Medicare Other | Source: Ambulatory Visit | Attending: Family Medicine | Admitting: Family Medicine

## 2020-06-17 DIAGNOSIS — M79605 Pain in left leg: Secondary | ICD-10-CM | POA: Diagnosis not present

## 2020-06-18 ENCOUNTER — Ambulatory Visit (HOSPITAL_COMMUNITY): Payer: Medicare Other

## 2020-06-20 ENCOUNTER — Ambulatory Visit (HOSPITAL_COMMUNITY): Payer: Medicare Other

## 2020-06-23 ENCOUNTER — Ambulatory Visit (HOSPITAL_COMMUNITY): Payer: Medicare Other

## 2020-06-23 DIAGNOSIS — Z794 Long term (current) use of insulin: Secondary | ICD-10-CM | POA: Diagnosis not present

## 2020-06-23 DIAGNOSIS — E1165 Type 2 diabetes mellitus with hyperglycemia: Secondary | ICD-10-CM | POA: Diagnosis not present

## 2020-06-24 ENCOUNTER — Ambulatory Visit (INDEPENDENT_AMBULATORY_CARE_PROVIDER_SITE_OTHER): Payer: Medicare Other | Admitting: Orthopaedic Surgery

## 2020-06-24 ENCOUNTER — Encounter: Payer: Self-pay | Admitting: Orthopaedic Surgery

## 2020-06-24 DIAGNOSIS — M25562 Pain in left knee: Secondary | ICD-10-CM | POA: Diagnosis not present

## 2020-06-24 DIAGNOSIS — G8929 Other chronic pain: Secondary | ICD-10-CM | POA: Diagnosis not present

## 2020-06-24 MED ORDER — BUPIVACAINE HCL 0.5 % IJ SOLN
2.0000 mL | INTRAMUSCULAR | Status: AC | PRN
  Administered 2020-06-24: 2 mL via INTRA_ARTICULAR

## 2020-06-24 MED ORDER — METHYLPREDNISOLONE ACETATE 40 MG/ML IJ SUSP
40.0000 mg | INTRAMUSCULAR | Status: AC | PRN
  Administered 2020-06-24: 40 mg via INTRA_ARTICULAR

## 2020-06-24 MED ORDER — LIDOCAINE HCL 1 % IJ SOLN
2.0000 mL | INTRAMUSCULAR | Status: AC | PRN
  Administered 2020-06-24: 2 mL

## 2020-06-24 NOTE — Progress Notes (Signed)
Office Visit Note   Patient: Sarah [name]           Date of Birth: 02-14-1945           MRN: [id number] Visit Date: 06/24/2020              Requested by: [name], [name], MD [address] [place],  [place] [address] PCP: [name], [name], MD   Assessment & Plan: Visit Diagnoses:  1. Chronic pain of left knee     Plan: Impression is acute left knee pain due to arthritis flare versus meniscus injury. She has underlying osteoarthritis of the medial and patellofemoral compartments. We discussed proceeding with a joint aspiration w/ analysis and cortisone injection today. Patient is amenable to this plan. 18 ccs of blood-tinged fluid was aspirated from her left knee. If this fails to improve her symptoms or the swelling reoccurs we'll consider ordering an MRI of her knee to evaluate for a structural abnormality. In the mean time we'll provide her with a hinged knee brace and cane to assist with ambulation.  Follow-Up Instructions: Return if symptoms worsen or fail to improve.   Orders:  Orders Placed This Encounter  Procedures   Large Joint Inj   Cell count + diff,  w/ cryst-synvl fld    No orders of the defined types were placed in this encounter.     Procedures: Large Joint Inj: L knee on 06/24/2020 5:15 PM Details: 22 G needle Medications: 2 mL bupivacaine 0.5 %; 2 mL lidocaine 1 %; 40 mg methylPREDNISolone acetate 40 MG/ML Outcome: tolerated well, no immediate complications Patient was prepped and draped in the usual sterile fashion.      Clinical Data: No additional findings.   Subjective: Chief Complaint  Patient presents with   Left Knee - Pain    HPI Sarah [name] is a 75 y.o. female who presents for evaluation of left knee pain for 3 weeks. Symptoms started after an injury where she was walking down the stairs and her left knee gave out on her. Since then she's had constant knee pain that is dull and achy in nature localized to the anterior  knee. It is made worse with prolonged sitting and transitions from sit to stand, and improves with walking. She describes some fullness to the posterior knee and swelling since the injury. She states her knee feels unstable like it may give out on her. She also endorses a popping sensation but denies locking or catching. She has been taking Tylenol and Aleve with minimal pain relief. She has not had any injections or done physical therapy. She has a history of a left knee arthroscopy over 20 years ago.   Review of Systems Review of Systems was reviewed and negative unless as stated in HPI.  Objective: Vital Signs: There were no vitals taken for this visit.  Physical Exam  Ortho Exam Left knee exam demonstrates a moderate effusion. Previous portal incisions. No erythema or warmth. She is tender over the medial joint line, lateral joint line and patellar facets. There is some fullness to the popliteal fossa but no tenderness. ROM is 0 to 110 degrees with pain at endpoint flexion. Stable varus and valgus stress. Distal neurovascular exam intact. Crepitus with ROM.  Specialty Comments:  No specialty comments available.  Imaging: Radiographs of the left knee dated 05/28/20 were reviewed and demonstrate medial and patellofemoral compartment osteoarthritis and chondrocalcinosis.   PMFS History: Patient Active Problem List   Diagnosis Date  Noted   Coronary artery disease of native artery of native heart with stable angina pectoris ([place]) 03/04/2020   Diabetes mellitus ([place]) 03/03/2020   Aortic atherosclerosis ([place]) 02/12/2020   Bilateral leg cramps 01/21/2020   Anxiety disorder 01/21/2020   Type 2 diabetes mellitus with hyperglycemia, with long-term current use of insulin ([place]) 08/15/2019   Type 2 diabetes mellitus with diabetic polyneuropathy, with long-term current use of insulin ([place]) 08/15/2019   Type 2 diabetes mellitus with microalbuminuria, with long-term current use of insulin ([place])  08/15/2019   Hyperlipidemia LDL goal <70 08/15/2019   Bilateral carpal tunnel syndrome 03/27/2019   Osteoarthritis of both knees 11/27/2018   CKD (chronic kidney disease), stage II 11/27/2018   GERD (gastroesophageal reflux disease) 06/26/2018   Homonymous hemianopia, left 02/08/2018   Diabetes mellitus with coincident hypertension (HCC)    Trigger finger, right index finger 10/17/2017   Low back pain 07/29/2017   Carotid artery stenosis 11/02/2016   Left arm weakness    Diastolic dysfunction    Hypertension with heart disease    Acute blood loss anemia    Cerebral infarction due to embolism of right middle cerebral artery ([place])    Perirectal abscess 09/26/2015   Vitamin D deficiency 07/23/2015   Osteopenia 02/16/2013   Ankle fracture 01/19/2013   Anemia 09/27/2011   Type II diabetes mellitus with nephropathy ([place]) 09/24/2011   Mixed hyperlipidemia 09/24/2011   Past Medical History:  Diagnosis Date   Bilateral carpal tunnel syndrome 03/27/2019   Boils    Glaucoma    Heart murmur    HTN (hypertension)    Hx of adenomatous colonic polyps    Hyperlipidemia    Osteoporosis    Pneumonia    Stroke ([place])    Type II or unspecified type diabetes mellitus without mention of complication, uncontrolled     Family History  Problem Relation Age of Onset   Diabetes Mother    Hypertension Mother    Stroke Father    Stroke Maternal Uncle    Stroke Paternal Uncle    Colon cancer Neg Hx    Esophageal cancer Neg Hx    Rectal cancer Neg Hx    Stomach cancer Neg Hx     Past Surgical History:  Procedure Laterality Date   ABDOMINAL HYSTERECTOMY     CARPAL TUNNEL RELEASE     right   COLONOSCOPY  12-10-10   per Dr. Deatra Ina, clear, repeat in 7 yrs    CORONARY STENT INTERVENTION N/A 03/11/2020   Procedure: CORONARY STENT INTERVENTION;  Surgeon: Jettie Booze, MD;  Location: [facility];  Service: Cardiovascular;  Laterality: N/A;   INCISION AND DRAINAGE PERIRECTAL ABSCESS N/A  09/26/2015   Procedure: IRRIGATION AND DEBRIDEMENT PERIRECTAL ABSCESS;  Surgeon: Mickeal Skinner, MD;  Location: [facility];  Service: General;  Laterality: N/A;   INTRAVASCULAR ULTRASOUND/IVUS N/A 03/11/2020   Procedure: Intravascular Ultrasound/IVUS;  Surgeon: Jettie Booze, MD;  Location: [facility];  Service: Cardiovascular;  Laterality: N/A;   KNEE ARTHROSCOPY     right   LEFT HEART CATH AND CORONARY ANGIOGRAPHY N/A 03/11/2020   Procedure: LEFT HEART CATH AND CORONARY ANGIOGRAPHY;  Surgeon: Jettie Booze, MD;  Location: [facility];  Service: Cardiovascular;  Laterality: N/A;   LOOP RECORDER INSERTION N/A 10/19/2016   Procedure: LOOP RECORDER INSERTION;  Surgeon: Thompson Grayer, MD;  Location: [facility];  Service: Cardiovascular;  Laterality: N/A;   ORIF ANKLE FRACTURE Right 03/24/2012  Procedure: OPEN REDUCTION INTERNAL FIXATION (ORIF) ANKLE FRACTURE;  Surgeon: Newt Minion, MD;  Location: [facility];  Service: Orthopedics;  Laterality: Right;  Open Reduction Internal Fixation Right Bimalleolar ankle fracture   POLYPECTOMY     TEE WITHOUT CARDIOVERSION N/A 10/18/2016   Procedure: TRANSESOPHAGEAL ECHOCARDIOGRAM (TEE);  Surgeon: Fay Records, MD;  Location: [facility];  Service: Cardiovascular;  Laterality: N/A;   TONSILLECTOMY     Social History   Occupational History   Not on file  Tobacco Use   Smoking status: Former    Pack years: 0.00    Types: Cigarettes    Quit date: 10/15/2016    Years since quitting: 3.6   Smokeless tobacco: Never   Tobacco comments:    smokes occ.   Vaping Use   Vaping Use: Never used  Substance and Sexual Activity   Alcohol use: Yes    Alcohol/week: 0.0 standard drinks    Comment: occ   Drug use: No   Sexual activity: Not on file

## 2020-06-25 ENCOUNTER — Ambulatory Visit (HOSPITAL_COMMUNITY): Payer: Medicare Other

## 2020-06-25 LAB — SYNOVIAL FLUID ANALYSIS, COMPLETE
Basophils, %: 0 %
Eosinophils-Synovial: 8 % — ABNORMAL HIGH (ref 0–2)
Lymphocytes-Synovial Fld: 15 % (ref 0–74)
Monocyte/Macrophage: 8 % (ref 0–69)
Neutrophil, Synovial: 61 % — ABNORMAL HIGH (ref 0–24)
Synoviocytes, %: 8 % (ref 0–15)
WBC, Synovial: 142 cells/uL (ref <150)

## 2020-06-25 LAB — TIQ-NTM

## 2020-06-27 ENCOUNTER — Ambulatory Visit (HOSPITAL_COMMUNITY): Payer: Medicare Other

## 2020-06-30 ENCOUNTER — Ambulatory Visit (HOSPITAL_COMMUNITY): Payer: Medicare Other

## 2020-06-30 NOTE — Progress Notes (Signed)
[facility] Summary Report / Loop Recorder 

## 2020-07-02 ENCOUNTER — Ambulatory Visit (HOSPITAL_COMMUNITY): Payer: Medicare Other

## 2020-07-04 ENCOUNTER — Ambulatory Visit (HOSPITAL_COMMUNITY): Payer: Medicare Other

## 2020-07-05 LAB — CUP PACEART REMOTE DEVICE CHECK
Date Time Interrogation Session: 20220624234805
Implantable Pulse Generator Implant Date: 20181009

## 2020-07-07 ENCOUNTER — Ambulatory Visit (HOSPITAL_COMMUNITY): Payer: Medicare Other

## 2020-07-07 ENCOUNTER — Ambulatory Visit (INDEPENDENT_AMBULATORY_CARE_PROVIDER_SITE_OTHER): Payer: Medicare Other

## 2020-07-07 ENCOUNTER — Other Ambulatory Visit: Payer: Self-pay | Admitting: Family Medicine

## 2020-07-07 DIAGNOSIS — I639 Cerebral infarction, unspecified: Secondary | ICD-10-CM

## 2020-07-09 ENCOUNTER — Ambulatory Visit (HOSPITAL_COMMUNITY): Payer: Medicare Other

## 2020-07-11 ENCOUNTER — Telehealth: Payer: Self-pay

## 2020-07-11 ENCOUNTER — Ambulatory Visit (HOSPITAL_COMMUNITY): Payer: Medicare Other

## 2020-07-11 NOTE — Telephone Encounter (Signed)
Carelink alert- Device has reached RRT.   Spoke with pt, advised of options.  She chooses to leave device implanted, advised tos chedule appt wth Dr. Rayann Heman if she changes her mind.    Pt to unplug home monitor;  Carelink updated and return kit ordered.  Future appts cancelled and [facility] noted.

## 2020-07-16 ENCOUNTER — Ambulatory Visit (HOSPITAL_COMMUNITY): Payer: Medicare Other

## 2020-07-16 ENCOUNTER — Ambulatory Visit: Payer: Medicare Other | Admitting: Family Medicine

## 2020-07-18 ENCOUNTER — Ambulatory Visit (HOSPITAL_COMMUNITY): Payer: Medicare Other

## 2020-07-21 ENCOUNTER — Ambulatory Visit (HOSPITAL_COMMUNITY): Payer: Medicare Other

## 2020-07-23 ENCOUNTER — Ambulatory Visit (HOSPITAL_COMMUNITY): Payer: Medicare Other

## 2020-07-24 NOTE — Progress Notes (Signed)
[facility] Summary Report / Loop Recorder 

## 2020-07-25 ENCOUNTER — Ambulatory Visit (HOSPITAL_COMMUNITY): Payer: Medicare Other

## 2020-07-25 ENCOUNTER — Ambulatory Visit: Payer: Medicare Other | Admitting: Family Medicine

## 2020-07-31 ENCOUNTER — Encounter: Payer: Self-pay | Admitting: Orthopaedic Surgery

## 2020-07-31 ENCOUNTER — Ambulatory Visit (INDEPENDENT_AMBULATORY_CARE_PROVIDER_SITE_OTHER): Payer: Medicare Other | Admitting: Orthopaedic Surgery

## 2020-07-31 VITALS — Ht 60.0 in | Wt 140.0 lb

## 2020-07-31 DIAGNOSIS — G8929 Other chronic pain: Secondary | ICD-10-CM | POA: Diagnosis not present

## 2020-07-31 DIAGNOSIS — M25562 Pain in left knee: Secondary | ICD-10-CM | POA: Diagnosis not present

## 2020-07-31 NOTE — Progress Notes (Signed)
Patient: Sarah [name]           Date of Birth: 1945/07/26           MRN: [id number] Visit Date: 07/31/2020 PCP: [name], [name], MD   Assessment & Plan:  Chief Complaint:  Chief Complaint  Patient presents with   Left Knee - Follow-up   Visit Diagnoses:  1. Chronic pain of left knee     Plan: [name] returns today for ongoing left knee pain.  We saw her about 6 weeks ago for this and provided her with a cortisone injection which helped for about 2 weeks.  The pain has unfortunately come back.  She does endorse occasional catching and locking.  Left knee exam is unchanged.  Given temporary relief from the cortisone injection I would like to obtain MRI to rule out structural abnormalities as well as stress fracture.  Follow-up after the MRI.  Follow-Up Instructions: Return for after MRI.   Orders:  No orders of the defined types were placed in this encounter.  No orders of the defined types were placed in this encounter.   Imaging: No results found.  PMFS History: Patient Active Problem List   Diagnosis Date Noted   Coronary artery disease of native artery of native heart with stable angina pectoris ([place]) 03/04/2020   Diabetes mellitus ([place]) 03/03/2020   Aortic atherosclerosis ([place]) 02/12/2020   Bilateral leg cramps 01/21/2020   Anxiety disorder 01/21/2020   Type 2 diabetes mellitus with hyperglycemia, with long-term current use of insulin ([place]) 08/15/2019   Type 2 diabetes mellitus with diabetic polyneuropathy, with long-term current use of insulin ([place]) 08/15/2019   Type 2 diabetes mellitus with microalbuminuria, with long-term current use of insulin ([place]) 08/15/2019   Hyperlipidemia LDL goal <70 08/15/2019   Bilateral carpal tunnel syndrome 03/27/2019   Osteoarthritis of both knees 11/27/2018   CKD (chronic kidney disease), stage II 11/27/2018   GERD (gastroesophageal reflux disease) 06/26/2018   Homonymous hemianopia, left 02/08/2018   Diabetes  mellitus with coincident hypertension (HCC)    Trigger finger, right index finger 10/17/2017   Low back pain 07/29/2017   Carotid artery stenosis 11/02/2016   Left arm weakness    Diastolic dysfunction    Hypertension with heart disease    Acute blood loss anemia    Cerebral infarction due to embolism of right middle cerebral artery ([place])    Perirectal abscess 09/26/2015   Vitamin D deficiency 07/23/2015   Osteopenia 02/16/2013   Ankle fracture 01/19/2013   Anemia 09/27/2011   Type II diabetes mellitus with nephropathy ([place]) 09/24/2011   Mixed hyperlipidemia 09/24/2011   Past Medical History:  Diagnosis Date   Bilateral carpal tunnel syndrome 03/27/2019   Boils    Glaucoma    Heart murmur    HTN (hypertension)    Hx of adenomatous colonic polyps    Hyperlipidemia    Osteoporosis    Pneumonia    Stroke ([place])    Type II or unspecified type diabetes mellitus without mention of complication, uncontrolled     Family History  Problem Relation Age of Onset   Diabetes Mother    Hypertension Mother    Stroke Father    Stroke Maternal Uncle    Stroke Paternal Uncle    Colon cancer Neg Hx    Esophageal cancer Neg Hx    Rectal cancer Neg Hx    Stomach cancer Neg Hx     Past Surgical History:  Procedure Laterality  Date   ABDOMINAL HYSTERECTOMY     CARPAL TUNNEL RELEASE     right   COLONOSCOPY  12-10-10   per Dr. Deatra Ina, clear, repeat in 7 yrs    CORONARY STENT INTERVENTION N/A 03/11/2020   Procedure: CORONARY STENT INTERVENTION;  Surgeon: Jettie Booze, MD;  Location: [facility];  Service: Cardiovascular;  Laterality: N/A;   INCISION AND DRAINAGE PERIRECTAL ABSCESS N/A 09/26/2015   Procedure: IRRIGATION AND DEBRIDEMENT PERIRECTAL ABSCESS;  Surgeon: Mickeal Skinner, MD;  Location: [facility];  Service: General;  Laterality: N/A;   INTRAVASCULAR ULTRASOUND/IVUS N/A 03/11/2020   Procedure: Intravascular Ultrasound/IVUS;  Surgeon: Jettie Booze, MD;  Location: [facility];  Service: Cardiovascular;  Laterality: N/A;   KNEE ARTHROSCOPY     right   LEFT HEART CATH AND CORONARY ANGIOGRAPHY N/A 03/11/2020   Procedure: LEFT HEART CATH AND CORONARY ANGIOGRAPHY;  Surgeon: Jettie Booze, MD;  Location: [facility];  Service: Cardiovascular;  Laterality: N/A;   LOOP RECORDER INSERTION N/A 10/19/2016   Procedure: LOOP RECORDER INSERTION;  Surgeon: Thompson Grayer, MD;  Location: [facility];  Service: Cardiovascular;  Laterality: N/A;   ORIF ANKLE FRACTURE Right 03/24/2012   Procedure: OPEN REDUCTION INTERNAL FIXATION (ORIF) ANKLE FRACTURE;  Surgeon: Newt Minion, MD;  Location: [facility];  Service: Orthopedics;  Laterality: Right;  Open Reduction Internal Fixation Right Bimalleolar ankle fracture   POLYPECTOMY     TEE WITHOUT CARDIOVERSION N/A 10/18/2016   Procedure: TRANSESOPHAGEAL ECHOCARDIOGRAM (TEE);  Surgeon: Fay Records, MD;  Location: [facility];  Service: Cardiovascular;  Laterality: N/A;   TONSILLECTOMY     Social History   Occupational History   Not on file  Tobacco Use   Smoking status: Former    Types: Cigarettes    Quit date: 10/15/2016    Years since quitting: 3.7   Smokeless tobacco: Never   Tobacco comments:    smokes occ.   Vaping Use   Vaping Use: Never used  Substance and Sexual Activity   Alcohol use: Yes    Alcohol/week: 0.0 standard drinks    Comment: occ   Drug use: No   Sexual activity: Not on file

## 2020-07-31 NOTE — Addendum Note (Signed)
Addended by: Lendon Collar on: 07/31/2020 08:39 AM   Modules accepted: Orders

## 2020-08-14 ENCOUNTER — Ambulatory Visit
Admission: RE | Admit: 2020-08-14 | Discharge: 2020-08-14 | Disposition: A | Discharge status: Home / Self Care | Payer: Medicare Other | Source: Ambulatory Visit | Attending: Orthopaedic Surgery | Admitting: Orthopaedic Surgery

## 2020-08-14 ENCOUNTER — Other Ambulatory Visit: Payer: Self-pay

## 2020-08-14 DIAGNOSIS — M25462 Effusion, left knee: Secondary | ICD-10-CM | POA: Diagnosis not present

## 2020-08-14 DIAGNOSIS — G8929 Other chronic pain: Secondary | ICD-10-CM

## 2020-08-14 DIAGNOSIS — M25562 Pain in left knee: Secondary | ICD-10-CM

## 2020-08-14 DIAGNOSIS — M7122 Synovial cyst of popliteal space [Baker], left knee: Secondary | ICD-10-CM | POA: Diagnosis not present

## 2020-08-14 DIAGNOSIS — M1712 Unilateral primary osteoarthritis, left knee: Secondary | ICD-10-CM | POA: Diagnosis not present

## 2020-08-14 DIAGNOSIS — M7052 Other bursitis of knee, left knee: Secondary | ICD-10-CM | POA: Diagnosis not present

## 2020-08-16 ENCOUNTER — Other Ambulatory Visit: Payer: Medicare Other

## 2020-08-18 ENCOUNTER — Telehealth: Payer: Self-pay

## 2020-08-18 NOTE — Progress Notes (Signed)
Needs appt.  Thanks.

## 2020-08-18 NOTE — Telephone Encounter (Signed)
Called patient no answer LMOM. Needs appt to review MRI report

## 2020-08-18 NOTE — Progress Notes (Signed)
After vacation is fine.  Thanks.

## 2020-08-19 ENCOUNTER — Ambulatory Visit (INDEPENDENT_AMBULATORY_CARE_PROVIDER_SITE_OTHER): Payer: Medicare Other | Admitting: Orthopaedic Surgery

## 2020-08-19 ENCOUNTER — Other Ambulatory Visit: Payer: Self-pay

## 2020-08-19 DIAGNOSIS — M1712 Unilateral primary osteoarthritis, left knee: Secondary | ICD-10-CM

## 2020-08-19 MED ORDER — DICLOFENAC SODIUM 75 MG PO TBEC: 75.0000 mg | DELAYED_RELEASE_TABLET | Freq: Two times a day (BID) | ORAL | 0 refills | Status: DC | PRN

## 2020-08-19 NOTE — Progress Notes (Signed)
Office Visit Note   Patient: Sarah [name]           Date of Birth: 10-23-1945           MRN: [id number] Visit Date: 08/19/2020              Requested by: [name], [name], MD [address] [place],  [place] [id number] PCP: [name], [name], MD   Assessment & Plan: Visit Diagnoses:  1. Unilateral primary osteoarthritis, left knee     Plan: Impression is left knee advanced degenerative joint disease.  At this point she is taking Aleve without significant relief.  We have discussed treating her symptoms with viscosupplementation injection versus total knee arthroplasty.  She would like to think about viscosupplementation injection.  Handout provided.  She will let us know if she is interested.  Follow-up with [place] as needed.  This patient is diagnosed with osteoarthritis of the knee(s).    Radiographs show evidence of joint space narrowing, osteophytes, subchondral sclerosis and/or subchondral cysts.  This patient has knee pain which interferes with functional and activities of daily living.    This patient has experienced inadequate response, adverse effects and/or intolerance with conservative treatments such as acetaminophen, NSAIDS, topical creams, physical therapy or regular exercise, knee bracing and/or weight loss.   This patient has experienced inadequate response or has a contraindication to intra articular steroid injections for at least 3 months.   This patient is not scheduled to have a total knee replacement within 6 months of starting treatment with viscosupplementation.   Follow-Up Instructions: Return if symptoms worsen or fail to improve.   Orders:  No orders of the defined types were placed in this encounter.  Meds ordered this encounter  Medications   diclofenac (VOLTAREN) 75 MG EC tablet    Sig: Take 1 tablet (75 mg total) by mouth 2 (two) times daily as needed.    Dispense:  60 tablet    Refill:  0      Procedures: No procedures  performed   Clinical Data: No additional findings.   Subjective: Chief Complaint  Patient presents with   Left Knee - Pain    HPI patient is a pleasant 75 year old female who comes in today to discuss MRI results of the left knee.  She has had pain for the past several months which has progressively worsened.  The pain she has is throughout the entire knee and worse going from a seated to standing position.  She was seen in our office a few little while back where cortisone injection was performed.  This temporarily helped.  Subsequent MRI of the left knee ordered which showed moderate tricompartmental degenerative changes as well as fraying to the medial meniscus in addition to moderate to severe MCL and Pez anserine bursitis.     Objective: Vital Signs: There were no vitals taken for this visit.    Ortho Exam unchanged left knee exam  Specialty Comments:  No specialty comments available.  Imaging: No new imaging test   PMFS History: Patient Active Problem List   Diagnosis Date Noted   Coronary artery disease of native artery of native heart with stable angina pectoris ([place]) 03/04/2020   Diabetes mellitus ([place]) 03/03/2020   Aortic atherosclerosis ([place]) 02/12/2020   Bilateral leg cramps 01/21/2020   Anxiety disorder 01/21/2020   Type 2 diabetes mellitus with hyperglycemia, with long-term current use of insulin ([place]) 08/15/2019   Type 2 diabetes mellitus with diabetic polyneuropathy, with long-term current  use of insulin ([place]) 08/15/2019   Type 2 diabetes mellitus with microalbuminuria, with long-term current use of insulin ([place]) 08/15/2019   Hyperlipidemia LDL goal <70 08/15/2019   Bilateral carpal tunnel syndrome 03/27/2019   Osteoarthritis of both knees 11/27/2018   CKD (chronic kidney disease), stage II 11/27/2018   GERD (gastroesophageal reflux disease) 06/26/2018   Homonymous hemianopia, left 02/08/2018   Diabetes mellitus with coincident hypertension ([place])     Trigger finger, right index finger 10/17/2017   Low back pain 07/29/2017   Carotid artery stenosis 11/02/2016   Left arm weakness    Diastolic dysfunction    Hypertension with heart disease    Acute blood loss anemia    Cerebral infarction due to embolism of right middle cerebral artery ([place])    Perirectal abscess 09/26/2015   Vitamin D deficiency 07/23/2015   Osteopenia 02/16/2013   Ankle fracture 01/19/2013   Anemia 09/27/2011   Type II diabetes mellitus with nephropathy ([place]) 09/24/2011   Mixed hyperlipidemia 09/24/2011   Past Medical History:  Diagnosis Date   Bilateral carpal tunnel syndrome 03/27/2019   Boils    Glaucoma    Heart murmur    HTN (hypertension)    Hx of adenomatous colonic polyps    Hyperlipidemia    Osteoporosis    Pneumonia    Stroke ([place])    Type II or unspecified type diabetes mellitus without mention of complication, uncontrolled     Family History  Problem Relation Age of Onset   Diabetes Mother    Hypertension Mother    Stroke Father    Stroke Maternal Uncle    Stroke Paternal Uncle    Colon cancer Neg Hx    Esophageal cancer Neg Hx    Rectal cancer Neg Hx    Stomach cancer Neg Hx     Past Surgical History:  Procedure Laterality Date   ABDOMINAL HYSTERECTOMY     CARPAL TUNNEL RELEASE     right   COLONOSCOPY  12-10-10   per Dr. Deatra Ina, clear, repeat in 7 yrs    CORONARY STENT INTERVENTION N/A 03/11/2020   Procedure: CORONARY STENT INTERVENTION;  Surgeon: Jettie Booze, MD;  Location: [facility];  Service: Cardiovascular;  Laterality: N/A;   INCISION AND DRAINAGE PERIRECTAL ABSCESS N/A 09/26/2015   Procedure: IRRIGATION AND DEBRIDEMENT PERIRECTAL ABSCESS;  Surgeon: Mickeal Skinner, MD;  Location: [facility];  Service: General;  Laterality: N/A;   INTRAVASCULAR ULTRASOUND/IVUS N/A 03/11/2020   Procedure: Intravascular Ultrasound/IVUS;  Surgeon: Jettie Booze, MD;  Location: [facility];  Service: Cardiovascular;   Laterality: N/A;   KNEE ARTHROSCOPY     right   LEFT HEART CATH AND CORONARY ANGIOGRAPHY N/A 03/11/2020   Procedure: LEFT HEART CATH AND CORONARY ANGIOGRAPHY;  Surgeon: Jettie Booze, MD;  Location: [facility];  Service: Cardiovascular;  Laterality: N/A;   LOOP RECORDER INSERTION N/A 10/19/2016   Procedure: LOOP RECORDER INSERTION;  Surgeon: Thompson Grayer, MD;  Location: [facility];  Service: Cardiovascular;  Laterality: N/A;   ORIF ANKLE FRACTURE Right 03/24/2012   Procedure: OPEN REDUCTION INTERNAL FIXATION (ORIF) ANKLE FRACTURE;  Surgeon: Newt Minion, MD;  Location: [facility];  Service: Orthopedics;  Laterality: Right;  Open Reduction Internal Fixation Right Bimalleolar ankle fracture   POLYPECTOMY     TEE WITHOUT CARDIOVERSION N/A 10/18/2016   Procedure: TRANSESOPHAGEAL ECHOCARDIOGRAM (TEE);  Surgeon: Fay Records, MD;  Location: [facility];  Service: Cardiovascular;  Laterality: N/A;  TONSILLECTOMY     Social History   Occupational History   Not on file  Tobacco Use   Smoking status: Former    Types: Cigarettes    Quit date: 10/15/2016    Years since quitting: 3.8   Smokeless tobacco: Never   Tobacco comments:    smokes occ.   Vaping Use   Vaping Use: Never used  Substance and Sexual Activity   Alcohol use: Yes    Alcohol/week: 0.0 standard drinks    Comment: occ   Drug use: No   Sexual activity: Not on file

## 2020-08-20 ENCOUNTER — Telehealth: Payer: Self-pay

## 2020-08-20 NOTE — Telephone Encounter (Signed)
Okay for pt to take?

## 2020-08-20 NOTE — Telephone Encounter (Signed)
Patient called stating she was prescribed  diclofenac 75 mg by another provider and the side effects include risk for stroke and she want to make sure it is ok to take

## 2020-08-21 ENCOUNTER — Telehealth: Payer: Self-pay | Admitting: Orthopaedic Surgery

## 2020-08-21 NOTE — Telephone Encounter (Signed)
Pt called requesting a call back from [place] or Dr. Erlinda Hong. Pt has medication concerns of prescribed meds from yesterday. Please call pt at [phone].

## 2020-08-21 NOTE — Telephone Encounter (Signed)
PT is still waiting on this and would like a callback asap in reference to this medication to make sure it is safe.

## 2020-08-22 ENCOUNTER — Telehealth: Payer: Self-pay | Admitting: Orthopaedic Surgery

## 2020-08-22 NOTE — Telephone Encounter (Signed)
Because of Hx of PAD,HTN,CKD she needs to be cautious with NSAID's, I do not recommend it. Thanks, BJ

## 2020-08-22 NOTE — Telephone Encounter (Signed)
I left patient a voicemail letting her know that Dr. [name] does not recommend the medication & to call back with any questions.

## 2020-08-22 NOTE — Telephone Encounter (Signed)
Joycelyn Schmid spoke to patient

## 2020-08-22 NOTE — Telephone Encounter (Signed)
Patient called concerning the Rx Diclofenac. Patient wanted to know if it was ok to take the medication because of the side effects.  I spoke with Kathlee Nations and she advised  patient that it was ok to take the medication that the provider prescribed for her and if patient have any questions concerning the medication she can contact her pharmacy.

## 2020-09-11 ENCOUNTER — Ambulatory Visit: Payer: Medicare Other | Admitting: Adult Health

## 2020-09-11 ENCOUNTER — Encounter: Payer: Self-pay | Admitting: Adult Health

## 2020-09-11 VITALS — BP 143/72 | HR 77 | Ht 60.0 in | Wt 141.0 lb

## 2020-09-11 DIAGNOSIS — Z8673 Personal history of transient ischemic attack (TIA), and cerebral infarction without residual deficits: Secondary | ICD-10-CM

## 2020-09-11 DIAGNOSIS — G8929 Other chronic pain: Secondary | ICD-10-CM

## 2020-09-11 DIAGNOSIS — I69398 Other sequelae of cerebral infarction: Secondary | ICD-10-CM

## 2020-09-11 DIAGNOSIS — M25562 Pain in left knee: Secondary | ICD-10-CM | POA: Diagnosis not present

## 2020-09-11 DIAGNOSIS — R2689 Other abnormalities of gait and mobility: Secondary | ICD-10-CM | POA: Diagnosis not present

## 2020-09-11 NOTE — Patient Instructions (Signed)
Continue aspirin 81 mg daily and clopidogrel 75 mg daily  and Zetia  for secondary stroke prevention  Continue to follow up with PCP regarding cholesterol, blood pressure and diabetes management  Maintain strict control of hypertension with blood pressure goal below 130/90, diabetes with hemoglobin A1c goal below 6.5% and cholesterol with LDL cholesterol (bad cholesterol) goal below 70 mg/dL.   Referral placed to neuro rehab for additional therapy        Thank you for coming to see [place] at [facility]. I hope we have been able to provide you high quality care today.  You may receive a patient satisfaction survey over the next few weeks. We would appreciate your feedback and comments so that we may continue to improve ourselves and the health of our patients.

## 2020-09-11 NOTE — Progress Notes (Signed)
[facility]  PATIENT: Sarah [name] DOB: 05-Aug-1945    REASON FOR VISIT: Follow-up for stroke right MCA 10/2016 readmitted 02/08/2018 for right parieto-occipital stroke  HISTORY FROM: Patient  Chief complaint: Chief Complaint  Patient presents with   Follow-up    RM 3 alone Pt is well, states she is having more problems keeping her balance and memory complications in the last 2 months or so.       HPI:  Today, 09/11/2020, returns for requested visit due to c/o worsening balance over the past 2 months and gradual memory decline. Worsening knee pain after a near fall in June - seen by ortho for left knee arthritis and bursitis. Limiting ambulation due to pain.  She is able to ambulate without assistive device.  Mild decline of memory reported but able to maintain ADLs and IADLs independently.  Sedentary lifestyle without any type of memory or cognitive exercises.  Denies any specific weakness, numbness/tingling, speech difficulty or confusion.  Currently on aspirin, plavix, zetia and atorvastatin for secondary stroke prevention and hx of CAD. Blood pressure today 143/72.  Glucose levels routinely monitored which has been stable.  No further concerns at this time.     History provided for reference purposes only Update 06/21/2019:, Sarah [name] is being seen for stroke follow-up unaccompanied.  She has been stable from a stroke standpoint without new or worsening stroke/TIA symptoms.  Prior complaints of worsening imbalance, left arm numbness/tingling and speech difficulty with MRI and MRA stable without acute abnormalities.  She reports ongoing improvement and continues to do HEP as recommended by therapy at home.  EMG/NCV completed for left arm numbness/tingling with mild bilateral carpal tunnel syndrome but otherwise unremarkable.  Reports left arm symptoms resolved as well as left-sided neck symptoms.  Continues on clopidogrel and atorvastatin for secondary  stroke prevention.  Recently added Zetia by PCP due to recent lipid panel showing LDL 115.  Blood pressure today 146/72.  Recent A1c 12.3 with recent adjustments of insulin regimen and continuation of Metformin and Victoza by PCP.  Continues to follow routinely with PCP for HTN, HLD and DM management.  Loop recorder has not shown atrial fibrillation thus far.  No concerns at this time.  Update 02/13/2019: Sarah [name] is a 75 year old female who is being seen today for stroke follow-up.  She has been stable from a stroke standpoint with residual mild stable left homonymous hemianopsia, mild imbalance which she believes has been slightly worsening over the past couple of months along with possible worsening of prior aphasia from stroke in 2018.  She does admit to limited physical activity and does not have an established exercise regimen along with endorsing increased stress currently caring for her mother with increased illness and increased stress due to overall COVID-19 pandemic.  She has recently signed up for [employer] program which will be starting in March.  She also states for the past 2 weeks, she has been experiencing left arm numbness/tingling but will resolve quickly and symptoms intermittent.  She does endorse left-sided neck stiffness that radiates into her upper trapezius and increased pain when looking towards the left.  Denies any increased weakness.  Unable to determine if numbness/tingling symptoms occur in certain positions.  She has not previously experienced this sensation.  Denies any other new or worsening neurological symptoms.  She did have bilateral cataract surgery in 0000000 without complication. Continues on Plavix and atorvastatin for secondary stroke prevention without side effects.  Blood pressure today 130/60.  Continues to follow with PCP for DM management with recent A1c 9.7.  Loop recorder is not shown atrial fibrillation thus far.  Monitoring/battery life will be completed  10/2019.  She has received her first dose of Covid vaccination and plans on receiving second dose next week.  Denies new or worsening stroke/TIA symptoms.  No further concerns at this time.  06/27/2018 VIRTUAL VISIT: Sarah [name] is being seen today for three-month follow-up visit regarding recent stroke.  Initially scheduled for office visit follow-up but due to COVID-19 safety precautions, visit transition to telemedicine via MyChart with patient's consent.  She has been stable from a stroke standpoint with residual left homonymous hemianopsia and balance difficulties.  She continues to participate in outpatient PT with ongoing improvement.  Continues on Plavix and atorvastatin for secondary stroke prevention without reported side effects. Blood pressure monitored at home.  Lab work obtained yesterday with A1c 10.3 (prior 13.5).  She continues to follow with PCP for ongoing management.  Loop recorder has not shown atrial fibrillation thus far.  No further concerns at this time.  Denies new or worsening stroke/TIA symptoms.   Hospital follow-up 03/23/2018 CM: Sarah [name], 75 year old female returns for follow-up with readmission for stroke 1/29/ 2020.  MRI of the brain acute cortical and subcortical RPO infarcts.  She initially presented with left hemianopsia.  She is now on Plavix and aspirin for 3 months and then Plavix alone.  She has not had further stroke or TIA symptoms.  She has minimal bruising and no bleeding.  Hemoglobin A1c 13.5 on hospital admission.  She claims she is trying to get that down to a more reasonable level.  She remains on Lipitor without myalgias.  Blood pressure in the office today 146/87.  She continues to go to [facility].  She is to start rehab physical therapy and occupational therapy today.  Loop recorder so far no atrial fibrillation.  She is not driving due to her vision.  She returns for reevaluation  Hospital summary: right parieto-occipital infarcts in setting of  progressive R MCA stenosis, infarcts secondary to large vessel disease source Resultant L hemianopia  CT head R parietal watershed infarct. Small vessel disease.  MRI  Acute cortical and subcortical R P-O infarcts. Old R CR and R frontal lobe infarcts. Small vessel disease.  MRA head  No LVO. Focal stenosis R M1, R M2/M3, L M1 MRA neck B VA origin atherosclerosis 30-50% 2D Echo  pending  Loop interrogation - last check 1/3 w/o AF - interrogation today without AF LDL 76 HgbA1c 13.5 Lovenox 40 mg sq daily for VTE prophylaxis clopidogrel 75 mg daily prior to admission, now on aspirin 325 mg daily and clopidogrel 75 mg daily. Continue DAPT x 3 months then plavix alone Therapy recommendations:  OP OT and PT Disposition:  Home Patient advised not to drive      REVIEW OF SYSTEMS: Full 14 system review of systems performed and notable only for those listed, all others are neg:  Visual impairment, imbalance  ALLERGIES: No Known Allergies  HOME MEDICATIONS:   PAST MEDICAL HISTORY: Past Medical History:  Diagnosis Date   Bilateral carpal tunnel syndrome 03/27/2019   Boils    Glaucoma    Heart murmur    HTN (hypertension)    Hx of adenomatous colonic polyps    Hyperlipidemia    Osteoporosis    Pneumonia    Stroke ([place])    Type II or unspecified type diabetes mellitus without mention of complication,  uncontrolled     PAST SURGICAL HISTORY: Past Surgical History:  Procedure Laterality Date   ABDOMINAL HYSTERECTOMY     CARPAL TUNNEL RELEASE     right   COLONOSCOPY  12-10-10   per Dr. Deatra Ina, clear, repeat in 7 yrs    CORONARY STENT INTERVENTION N/A 03/11/2020   Procedure: CORONARY STENT INTERVENTION;  Surgeon: Jettie Booze, MD;  Location: [place] [facility];  Service: Cardiovascular;  Laterality: N/A;   INCISION AND DRAINAGE PERIRECTAL ABSCESS N/A 09/26/2015   Procedure: IRRIGATION AND DEBRIDEMENT PERIRECTAL ABSCESS;  Surgeon: Mickeal Skinner, MD;  Location: [place];   Service: General;  Laterality: N/A;   INTRAVASCULAR ULTRASOUND/IVUS N/A 03/11/2020   Procedure: Intravascular Ultrasound/IVUS;  Surgeon: Jettie Booze, MD;  Location: [place] [facility];  Service: Cardiovascular;  Laterality: N/A;   KNEE ARTHROSCOPY     right   LEFT HEART CATH AND CORONARY ANGIOGRAPHY N/A 03/11/2020   Procedure: LEFT HEART CATH AND CORONARY ANGIOGRAPHY;  Surgeon: Jettie Booze, MD;  Location: [place] [facility];  Service: Cardiovascular;  Laterality: N/A;   LOOP RECORDER INSERTION N/A 10/19/2016   Procedure: LOOP RECORDER INSERTION;  Surgeon: Thompson Grayer, MD;  Location: [place] [facility];  Service: Cardiovascular;  Laterality: N/A;   ORIF ANKLE FRACTURE Right 03/24/2012   Procedure: OPEN REDUCTION INTERNAL FIXATION (ORIF) ANKLE FRACTURE;  Surgeon: Newt Minion, MD;  Location: [place];  Service: Orthopedics;  Laterality: Right;  Open Reduction Internal Fixation Right Bimalleolar ankle fracture   POLYPECTOMY     TEE WITHOUT CARDIOVERSION N/A 10/18/2016   Procedure: TRANSESOPHAGEAL ECHOCARDIOGRAM (TEE);  Surgeon: Fay Records, MD;  Location: [facility];  Service: Cardiovascular;  Laterality: N/A;   TONSILLECTOMY      FAMILY HISTORY: Family History  Problem Relation Age of Onset   Diabetes Mother    Hypertension Mother    Stroke Father    Stroke Maternal Uncle    Stroke Paternal Uncle    Colon cancer Neg Hx    Esophageal cancer Neg Hx    Rectal cancer Neg Hx    Stomach cancer Neg Hx     SOCIAL HISTORY: Social History   Socioeconomic History   Marital status: Single    Spouse name: Not on file   Number of children: Not on file   Years of education: Not on file   Highest education level: Not on file  Occupational History   Not on file  Tobacco Use   Smoking status: Former    Types: Cigarettes    Quit date: 10/15/2016    Years since quitting: 3.9   Smokeless tobacco: Never   Tobacco comments:    smokes occ.   Vaping Use   Vaping Use: Never used   Substance and Sexual Activity   Alcohol use: Yes    Alcohol/week: 0.0 standard drinks    Comment: occ   Drug use: No   Sexual activity: Not on file  Other Topics Concern   Not on file  Social History Narrative   Not on file   Social Determinants of Health   Financial Resource Strain: Low Risk    Difficulty of Paying Living Expenses: Not hard at all  Food Insecurity: No Food Insecurity   Worried About Charity fundraiser in the Last Year: Never true   [place] in the Last Year: Never true  Transportation Needs: No Transportation Needs   Lack of Transportation (Medical): No   Lack of Transportation (  Non-Medical): No  Physical Activity: Inactive   Days of Exercise per Week: 0 days   Minutes of Exercise per Session: 0 min  Stress: No Stress Concern Present   Feeling of Stress : Not at all  Social Connections: Socially Isolated   Frequency of Communication with Friends and Family: More than three times a week   Frequency of Social Gatherings with Friends and Family: More than three times a week   Attends Religious Services: Never   Marine scientist or Organizations: No   Attends Archivist Meetings: Never   Marital Status: Divorced  Human resources officer Violence: Not At Risk   Fear of Current or Ex-Partner: No   Emotionally Abused: No   Physically Abused: No   Sexually Abused: No     PHYSICAL EXAM  Today's Vitals   09/11/20 1545  BP: (!) 143/72  Pulse: 77  Weight: 141 lb (64 kg)  Height: 5' (1.524 m)   Body mass index is 27.54 kg/m.  General: well developed, well nourished, pleasant elderly African-American female, seated, in no evident distress Head: head normocephalic and atraumatic.   Neck: supple with no carotid or supraclavicular bruits Cardiovascular: regular rate and rhythm, no murmurs Musculoskeletal: no deformity Skin:  no rash/petichiae Vascular:  Normal pulses all extremities   Neurologic Exam Mental Status: Awake and fully  alert. Occasional stuttering of speech/speech hesitancy (chronic). Oriented to place and time. Recent memory subjectively impaired and remote memory intact. Attention span, concentration and fund of knowledge appropriate. Mood and affect appropriate.  Cranial Nerves: Pupils equal, briskly reactive to light. Extraocular movements full without nystagmus. Visual fields full to confrontation. Hearing intact. Facial sensation intact. Face, tongue, palate moves normally and symmetrically.  Motor: Normal bulk and tone. Normal strength in all tested extremity muscles. Sensory.: intact to touch , pinprick , position and vibratory sensation.  Coordination: Rapid alternating movements normal in all extremities except slightly decreased left hand dexterity (chronic from prior stroke). Finger-to-nose and heel-to-shin performed accurately bilaterally. Gait and Station: Arises from chair without difficulty. Stance is normal. Gait demonstrates normal stride length and balance without use of assistive device although mild favoring of left knee.  Difficulty performing tandem walk and heel toe.  Reflexes: 1+ and symmetric. Toes downgoing.      DIAGNOSTIC DATA (LABS, IMAGING, TESTING) - I reviewed patient records, labs, notes, testing and imaging myself where available.  Lab Results  Component Value Date   WBC 7.5 03/04/2020   HGB 12.1 03/04/2020   HCT 37.3 03/04/2020   MCV 94 03/04/2020   PLT 342 03/04/2020      Component Value Date/Time   NA 137 03/04/2020 1025   K 4.2 03/04/2020 1025   CL 102 03/04/2020 1025   CO2 21 03/04/2020 1025   GLUCOSE 375 (H) 03/04/2020 1025   GLUCOSE 457 (H) 01/21/2020 1629   BUN 16 03/04/2020 1025   CREATININE 0.65 03/04/2020 1025   CREATININE 0.55 (L) 10/24/2019 1602   CALCIUM 9.7 03/04/2020 1025   PROT 6.1 03/26/2020 0954   ALBUMIN 3.4 (L) 03/26/2020 0954   AST 15 03/26/2020 0954   ALT 18 03/26/2020 0954   ALKPHOS 80 03/26/2020 0954   BILITOT 0.2 03/26/2020 0954    GFRNONAA 89 03/04/2020 1025   GFRNONAA 94 10/24/2019 1602   GFRAA 103 03/04/2020 1025   GFRAA 109 10/24/2019 1602   Lab Results  Component Value Date   CHOL 127 03/26/2020   HDL 62 03/26/2020   LDLCALC 48 03/26/2020  TRIG 93 03/26/2020   CHOLHDL 2.0 03/26/2020   Lab Results  Component Value Date   HGBA1C 8.9 (A) 06/13/2020        ASSESSMENT AND PLAN    Sarah [name] is a 75 y.o. female with history of hypertension, hyperlipidemia, prior right MCA stroke with no gross deficits 2018 but very minimal left hand fine motor deficits, diabetes presenting to the hospital 02/08/2018 with left homonymous hemianopsia, MRI cortical and subcortical RPO infarcts.  Loop recorder placed which has not shown atrial fibrillation thus far.     -c/o worsening balance and cognition - likely multifactorial in setting of recent knee injury and sedentary lifestyle.  Referral placed to PT for further balance and knee exercises.  Discussed importance of routine memory exercises and keeping active as well as managing stroke risk factors. No new deficits or concerns on todays exam.  Continue to follow with Dr. Erlinda Hong orthopedics for left knee pain -continue plavix and lipitor and Zetia for stroke prevention -monitor loop recorder for atrial fibrillation -Continue to monitor blood pressure at home -Continue stay active and maintain a healthy diet - maintain blood pressure goal <130/80, diabetes with hemoglobin A1c goal below 7.0% and lipids with LDL cholesterol goal below 70 mg/dL.     Stable from stroke standpoint recommend follow-up as needed   I spent 22 minutes of face-to-face and non-face-to-face time with patient.  This included previsit chart review, lab review, study review, order entry, electronic health record documentation, patient education and discussion as documented above  CC:  [place] provider: Dr. Sethi [name], Malka So, MD    Frann Rider, [facility]  [facility] [address] [place], [place] [address]  Phone [phone] Fax [phone] Note: This document was prepared with digital dictation and possible smart phrase technology. Any transcriptional errors that result from this process are unintentional.

## 2020-09-14 ENCOUNTER — Other Ambulatory Visit: Payer: Self-pay | Admitting: Physician Assistant

## 2020-09-18 DIAGNOSIS — E113293 Type 2 diabetes mellitus with mild nonproliferative diabetic retinopathy without macular edema, bilateral: Secondary | ICD-10-CM | POA: Diagnosis not present

## 2020-09-18 DIAGNOSIS — H401131 Primary open-angle glaucoma, bilateral, mild stage: Secondary | ICD-10-CM | POA: Diagnosis not present

## 2020-09-22 ENCOUNTER — Encounter: Payer: Self-pay | Admitting: Internal Medicine

## 2020-09-22 ENCOUNTER — Other Ambulatory Visit: Payer: Self-pay

## 2020-09-22 ENCOUNTER — Ambulatory Visit (INDEPENDENT_AMBULATORY_CARE_PROVIDER_SITE_OTHER): Payer: Medicare Other | Admitting: Internal Medicine

## 2020-09-22 VITALS — BP 162/70 | HR 74 | Ht 60.0 in | Wt 139.0 lb

## 2020-09-22 DIAGNOSIS — I700 Atherosclerosis of aorta: Secondary | ICD-10-CM | POA: Diagnosis not present

## 2020-09-22 DIAGNOSIS — I10 Essential (primary) hypertension: Secondary | ICD-10-CM | POA: Diagnosis not present

## 2020-09-22 DIAGNOSIS — I25118 Atherosclerotic heart disease of native coronary artery with other forms of angina pectoris: Secondary | ICD-10-CM | POA: Diagnosis not present

## 2020-09-22 DIAGNOSIS — E119 Type 2 diabetes mellitus without complications: Secondary | ICD-10-CM | POA: Diagnosis not present

## 2020-09-22 LAB — HEPATIC FUNCTION PANEL
ALT: 17 IU/L (ref 0–32)
AST: 20 IU/L (ref 0–40)
Albumin: 3.5 g/dL — ABNORMAL LOW (ref 3.7–4.7)
Alkaline Phosphatase: 99 IU/L (ref 44–121)
Bilirubin Total: <0.2 mg/dL (ref 0.0–1.2)
Bilirubin, Direct: <0.1 mg/dL (ref 0.00–0.40)
Total Protein: 6.2 g/dL (ref 6.0–8.5)

## 2020-09-22 LAB — LIPID PANEL
Chol/HDL Ratio: 2.4 ratio (ref 0.0–4.4)
Cholesterol, Total: 176 mg/dL (ref 100–199)
HDL: 74 mg/dL (ref >39)
LDL Chol Calc (NIH): 84 mg/dL (ref 0–99)
Triglycerides: 98 mg/dL (ref 0–149)
VLDL Cholesterol Cal: 18 mg/dL (ref 5–40)

## 2020-09-22 MED ORDER — METOPROLOL TARTRATE 25 MG PO TABS: 25.0000 mg | ORAL_TABLET | Freq: Two times a day (BID) | ORAL | 3 refills | Status: DC

## 2020-09-22 NOTE — Progress Notes (Signed)
Cardiology Office Note:    Date:  09/22/2020   ID:  Sarah [name], DOB [birth year], MRN [id number]  PCP:  [name], [name], MD  [facility] Cardiologist:  Rudean Haskell MD [place] Electrophysiologist:  None   CC: Follow up CAD  History of Present Illness:    Sarah [name] is a 75 y.o. female with a hx of DM with HTN, Prior Stroke in 2018, Aortic atherosclerosis who presents for evaluation 02/12/20. In interim of this visit, patient had echocardiogram that was relatively benign and a positive CCTA for RCA disease. Seen 2/22.  In interim of this visit, patient had LHC and PCI.  Seen 03/20/20. In interim of this visit, patient had normal vascular duplex.  See 09/22/20.  Patient notes that she is doing OK.  Since last visit notes feeling a bit tired   Relevant interval testing or therapy include PCI.  There are no interval hospital/ED visit.    No chest pain or pressure.  No SOB/DOE.  No weight gain or leg swelling.  No palpitations or syncope. Notes increased urinary hesitancy without UTI while on Farixga.  Has knee rehab and is getting diclofenac cream (twisted knee going down the stairs).  Notes urinary frequency with lasix and SGLT2i.  No PRN nitro needed.  AMB BP SBP 140.   Past Medical History:  Diagnosis Date   Bilateral carpal tunnel syndrome 03/27/2019   Boils    Glaucoma    Heart murmur    HTN (hypertension)    Hx of adenomatous colonic polyps    Hyperlipidemia    Osteoporosis    Pneumonia    Stroke ([place])    Type II or unspecified type diabetes mellitus without mention of complication, uncontrolled     Past Surgical History:  Procedure Laterality Date   ABDOMINAL HYSTERECTOMY     CARPAL TUNNEL RELEASE     right   COLONOSCOPY  12-10-10   per Dr. Deatra Ina, clear, repeat in 7 yrs    CORONARY STENT INTERVENTION N/A 03/11/2020   Procedure: CORONARY STENT INTERVENTION;  Surgeon: Jettie Booze, MD;  Location: [place] [facility];  Service:  Cardiovascular;  Laterality: N/A;   INCISION AND DRAINAGE PERIRECTAL ABSCESS N/A 09/26/2015   Procedure: IRRIGATION AND DEBRIDEMENT PERIRECTAL ABSCESS;  Surgeon: Mickeal Skinner, MD;  Location: [place];  Service: General;  Laterality: N/A;   INTRAVASCULAR ULTRASOUND/IVUS N/A 03/11/2020   Procedure: Intravascular Ultrasound/IVUS;  Surgeon: Jettie Booze, MD;  Location: [place] [facility];  Service: Cardiovascular;  Laterality: N/A;   KNEE ARTHROSCOPY     right   LEFT HEART CATH AND CORONARY ANGIOGRAPHY N/A 03/11/2020   Procedure: LEFT HEART CATH AND CORONARY ANGIOGRAPHY;  Surgeon: Jettie Booze, MD;  [place] [facility];  Service: Cardiovascular;  Laterality: N/A;   LOOP RECORDER INSERTION N/A 10/19/2016   Procedure: LOOP RECORDER INSERTION;  Surgeon: Thompson Grayer, MD;  Location: [place] [facility];  Service: Cardiovascular;  Laterality: N/A;   ORIF ANKLE FRACTURE Right 03/24/2012   Procedure: OPEN REDUCTION INTERNAL FIXATION (ORIF) ANKLE FRACTURE;  Surgeon: Newt Minion, MD;  Location: [place];  Service: Orthopedics;  Laterality: Right;  Open Reduction Internal Fixation Right Bimalleolar ankle fracture   POLYPECTOMY     TEE WITHOUT CARDIOVERSION N/A 10/18/2016   Procedure: TRANSESOPHAGEAL ECHOCARDIOGRAM (TEE);  Surgeon: Fay Records, MD;  Location: [facility];  Service: Cardiovascular;  Laterality: N/A;   TONSILLECTOMY      Current Medications: Current Meds  Medication Sig   acetaminophen (TYLENOL) 500 MG tablet Take 1,000 mg by mouth every 6 (six) hours as needed for mild pain or headache.   amLODipine (NORVASC) 10 MG tablet TAKE 1 TABLET(10 MG) BY MOUTH DAILY   aspirin EC 81 MG tablet Swallow whole. TAKE daily until after heart Cath (Patient taking differently: Take 81 mg by mouth daily.)   atorvastatin (LIPITOR) 80 MG tablet TAKE 1 TABLET(80 MG) BY MOUTH DAILY AT 6 PM   Blood Glucose Calibration (OT ULTRA/FASTTK CNTRL SOLN) SOLN    calcium carbonate (TUMS EX) 750  MG chewable tablet Chew 1 tablet by mouth as needed for heartburn.   clopidogrel (PLAVIX) 75 MG tablet Take 1 tablet (75 mg total) by mouth daily.   Continuous Blood Gluc Sensor (DEXCOM G6 SENSOR) MISC 1 Device by Does not apply route as directed.   Continuous Blood Gluc Transmit (DEXCOM G6 TRANSMITTER) MISC 1 Device by Does not apply route as directed.   dapagliflozin propanediol (FARXIGA) 10 MG TABS tablet Take 1 tablet (10 mg total) by mouth daily.   diclofenac (VOLTAREN) 75 MG EC tablet Take 1 tablet (75 mg total) by mouth 2 (two) times daily as needed.   diclofenac Sodium (VOLTAREN) 1 % GEL Apply 2 g topically 4 (four) times daily.   ezetimibe (ZETIA) 10 MG tablet TAKE 1 TABLET(10 MG) BY MOUTH DAILY   furosemide (LASIX) 20 MG tablet TAKE 1 TABLET(20 MG) BY MOUTH DAILY   insulin glargine (LANTUS SOLOSTAR) 100 UNIT/ML Solostar Pen Inject 64 Units into the skin daily.   insulin lispro (HUMALOG KWIKPEN) 200 UNIT/ML KwikPen Inject 14 Units into the skin 2 (two) times daily with breakfast and lunch AND 18 Units daily with supper. 15 U 15 min before meals.   Insulin Pen Needle 32G X 4 MM MISC 1 Device by Does not apply route in the morning, at noon, in the evening, and at bedtime.   Lancet Devices (SIMPLE DIAGNOSTICS LANCING DEV) MISC Check BS before and 2 hours after meals.   liraglutide (VICTOZA) 18 MG/3ML SOPN Inject 1.8 mg into the skin daily.   losartan (COZAAR) 50 MG tablet TAKE 1 TABLET(50 MG) BY MOUTH DAILY   metoprolol tartrate (LOPRESSOR) 25 MG tablet Take 1 tablet (25 mg total) by mouth 2 (two) times daily.   nitroGLYCERIN (NITROSTAT) 0.4 MG SL tablet Place 1 tablet (0.4 mg total) under the tongue every 5 (five) minutes as needed for chest pain. one tab every 5 minutes up to 3 tablets total over 15 minutes.   ONE TOUCH ULTRA TEST test strip Check BS before and 2 hours after meals.   PHARMACIST CHOICE ALCOHOL 70 % PADS    ROCKLATAN 0.02-0.005 % SOLN Place 1 drop into both eyes every  evening.   sertraline (ZOLOFT) 25 MG tablet TAKE 1 TABLET(25 MG) BY MOUTH DAILY   [DISCONTINUED] metoprolol tartrate (LOPRESSOR) 25 MG tablet Take 0.5 tablets (12.5 mg total) by mouth 2 (two) times daily.     Allergies:   Patient has no known allergies.   Social History   Socioeconomic History   Marital status: Single    Spouse name: Not on file   Number of children: Not on file   Years of education: Not on file   Highest education level: Not on file  Occupational History   Not on file  Tobacco Use   Smoking status: Former    Types: Cigarettes    Quit date: 10/15/2016    Years since quitting: 3.9  Smokeless tobacco: Never   Tobacco comments:    smokes occ.   Vaping Use   Vaping Use: Never used  Substance and Sexual Activity   Alcohol use: Yes    Alcohol/week: 0.0 standard drinks    Comment: occ   Drug use: No   Sexual activity: Not on file  Other Topics Concern   Not on file  Social History Narrative   Not on file   Social Determinants of Health   Financial Resource Strain: Low Risk    Difficulty of Paying Living Expenses: Not hard at all  Food Insecurity: No Food Insecurity   Worried About Charity fundraiser in the Last Year: Never true   [place] in the Last Year: Never true  Transportation Needs: No Transportation Needs   Lack of Transportation (Medical): No   Lack of Transportation (Non-Medical): No  Physical Activity: Inactive   Days of Exercise per Week: 0 days   Minutes of Exercise per Session: 0 min  Stress: No Stress Concern Present   Feeling of Stress : Not at all  Social Connections: Socially Isolated   Frequency of Communication with Friends and Family: More than three times a week   Frequency of Social Gatherings with Friends and Family: More than three times a week   Attends Religious Services: Never   Marine scientist or Organizations: No   Attends Music therapist: Never   Marital Status: Divorced    Family  History: The patient's family history includes Diabetes in her mother; Hypertension in her mother; Stroke in her father, maternal uncle, and paternal uncle. There is no history of Colon cancer, Esophageal cancer, Rectal cancer, or Stomach cancer. History of coronary artery disease notable for no members. History of heart failure notable for father. History of arrhythmia notable for no members.  ROS:   Please see the history of present illness.     All other systems reviewed and are negative.  EKGs/Labs/Other Studies Reviewed:    The following studies were reviewed today:  EKG:   03/04/20: SR rate 78 nonspecific TW flattening 02/12/2020: SR rate 81 WNL  LINQ monitor: Date: 01/31/20 Results: No AF in many reports reviewed  Transthoracic Echocardiogram: Date: 02/09/2018 Results:  1. The left ventricle has normal systolic function of 123456. The cavity  size is normal. There is no left ventricular wall thickness. Echo evidence  of impaired relaxation diastolic filling patterns. Indeterminent filling  pressures.   2. Normal left atrial size.   3. Normal right atrial size.   4. Normal tricuspid valve.   5. The aortic valve normal. There is mild thickening and mild  calcification of the aortic valve.   6. No atrial level shunt detected by color flow Doppler.   Transesophageal Echocardiogram: Date: 10/19/2015 Results: Mild fixed plaquing of the thoracic aorta no thrombus  Cardiac CT: Date:   Results: Aortic Atherosclerosis IMPRESSION: 1. Coronary calcium score of 25. This was 34 percentile for age and sex matched control.   2. Normal coronary origin with right dominance.   3. Multivessel CAD - mild LAD disease, moderate to severe stenosis of Circumflex and RCA. Sending for FFR analysis.   4.  Aortic atherosclerosis descending. FFR 1. Left Main: Normal   2. LAD: Proximal - 0.95, Mid - 0.87, Distal 0.71 - Abnormal   3. LCX: Proximal - 0.98, Mid 0.89, Distal 0.84 - Normal    4. Ramus: n/a   5. RCA: Proximal 0.99, Mid 0.51, Distal <  0.50 - Abnormal   Left/Right Heart Catheterizations: Date: 03/11/20 Results: Prox RCA lesion is 50% stenosed. Mid RCA lesion is 90% stenosed. A drug-eluting stent was successfully placed using a STENT RESOLUTE ONYX 3.0X30, post dilated to 3.75 mm, and optimized with IVUS. Post intervention, there is a 0% residual stenosis. Prox Cx lesion is 50% stenosed. This looks more significant in the caudal views. CT FFR of the circumflex was negative. Mid Cx lesion is 25% stenosed. Prox LAD lesion is 25% stenosed. The left ventricular systolic function is normal. LV end diastolic pressure is normal. LVEDP 14 mm Hg. The left ventricular ejection fraction is 55-65% by visual estimate. There is no aortic valve stenosis.   Successful PCI of the RCA.  Moderate disease in the proximal circumflex, if she had persistent symptoms, could reevaluate this vessel.   Severe bend in the LAD which may have affected CT FFR calculation.   I discussed the findings with the patient's sister.  Continue dual antiplatelet therapy along with aggressive secondary prevention.  Plan for same-day discharge.   Recent Labs: 10/24/2019: Brain Natriuretic Peptide 72 01/21/2020: Magnesium 1.9 02/12/2020: NT-Pro BNP 132 03/04/2020: BUN 16; Creatinine, Ser 0.65; Hemoglobin 12.1; Platelets 342; Potassium 4.2; Sodium 137 03/26/2020: ALT 18  Recent Lipid Panel    Component Value Date/Time   CHOL 127 03/26/2020 0954   TRIG 93 03/26/2020 0954   HDL 62 03/26/2020 0954   CHOLHDL 2.0 03/26/2020 0954   CHOLHDL 3 04/27/2019 1100   VLDL 22.8 04/27/2019 1100   LDLCALC 48 03/26/2020 0954   Physical Exam:    VS:  BP (!) 162/70   Pulse 74   Ht 5' (1.524 m)   Wt 139 lb (63 kg)   BMI 27.15 kg/m     Wt Readings from Last 3 Encounters:  09/22/20 139 lb (63 kg)  09/11/20 141 lb (64 kg)  07/31/20 140 lb (63.5 kg)   GEN:  Well nourished, well developed in no acute  distress HEENT: Normal NECK: No JVD LYMPHATICS: No lymphadenopathy CARDIAC: RRR, no murmurs, rubs, gallop RESPIRATORY:  Clear to auscultation without rales, wheezing or rhonchi  ABDOMEN: Soft, non-tender, non-distended MUSCULOSKELETAL:  Trace non-pitting bilaterally; No deformity  SKIN: Warm and dry NEUROLOGIC:  Alert and oriented x 3 PSYCHIATRIC:  Normal affect   ASSESSMENT:    1. Aortic atherosclerosis ([place])   2. Diabetes mellitus with coincident hypertension ([place])   3. Coronary artery disease of native artery of native heart with stable angina pectoris ([place])     PLAN:    In order of problems listed above:  Coronary Artery Disease; Obstructive Prior stroke on plavix Hyperlipidemia (mixed) Aortic Atherosclerosis Diabetes with Hypertension - asymptomatic  - anatomy: mRCA +/- LCx - continue DAPT for ASA and plavix planned for one year but can get cessation of DAPT (has been six months) for her dental procedure - increasing to metoprolol 25 mg PO BID - continue nitrates; PRN (discussed angina) -LDL goal less than 70 - lipids and LFTs today  Temporarily hold of SGLT2i with urinary sx; reaching out to pts endocrionlogist and PCP  CAS NOS - will re-eval at next visit   Will plan for March 2023 follow up unless new symptoms or abnormal test results warranting change in plan  Would be reasonable for  APP Follow up     Medication Adjustments/Labs and Tests Ordered: Current medicines are reviewed at length with the patient today.  Concerns regarding medicines are outlined above.  Orders Placed This Encounter  Procedures   Lipid panel   Hepatic function panel    Meds ordered this encounter  Medications   metoprolol tartrate (LOPRESSOR) 25 MG tablet    Sig: Take 1 tablet (25 mg total) by mouth 2 (two) times daily.    Dispense:  180 tablet    Refill:  3     Patient Instructions  Medication Instructions:  Your physician has recommended you make the following  change in your medication:  INCREASE: metoprolol tartrate to 25 mg by mouth twice daily  You may stop taking Wilder Glade  *If you need a refill on your cardiac medications before your next appointment, please call your pharmacy*   Lab Work: TODAY: FLP and LFT If you have labs (blood work) drawn today and your tests are completely normal, you will receive your results only by: [place] (if you have MyChart) OR A paper copy in the mail If you have any lab test that is abnormal or we need to change your treatment, we will call you to review the results.   Testing/Procedures: NONE   Follow-Up: At [facility], you and your health needs are our priority.  As part of our continuing mission to provide you with exceptional heart care, we have created designated Provider Care Teams.  These Care Teams include your primary Cardiologist (physician) and Advanced Practice Providers (APPs -  Physician Assistants and Nurse Practitioners) who all work together to provide you with the care you need, when you need it.   Your next appointment:   6 month(s)  The format for your next appointment:   In Person  Provider:   You may see Werner Lean, MD or one of the following Advanced Practice Providers on your designated Care Team:   Melina Copa, PA-C Ermalinda Barrios, PA-C        Signed, Werner Lean, MD  09/22/2020 10:20 AM    [place] \

## 2020-09-22 NOTE — Patient Instructions (Signed)
Medication Instructions:  Your physician has recommended you make the following change in your medication:  INCREASE: metoprolol tartrate to 25 mg by mouth twice daily  You may stop taking Wilder Glade  *If you need a refill on your cardiac medications before your next appointment, please call your pharmacy*   Lab Work: TODAY: FLP and LFT If you have labs (blood work) drawn today and your tests are completely normal, you will receive your results only by: [place] (if you have MyChart) OR A paper copy in the mail If you have any lab test that is abnormal or we need to change your treatment, we will call you to review the results.   Testing/Procedures: NONE   Follow-Up: At [facility], you and your health needs are our priority.  As part of our continuing mission to provide you with exceptional heart care, we have created designated Provider Care Teams.  These Care Teams include your primary Cardiologist (physician) and Advanced Practice Providers (APPs -  Physician Assistants and Nurse Practitioners) who all work together to provide you with the care you need, when you need it.   Your next appointment:   6 month(s)  The format for your next appointment:   In Person  Provider:   You may see Werner Lean, MD or one of the following Advanced Practice Providers on your designated Care Team:   Melina Copa, PA-C Ermalinda Barrios, PA-C

## 2020-09-25 ENCOUNTER — Telehealth: Payer: Self-pay

## 2020-09-25 DIAGNOSIS — I25118 Atherosclerotic heart disease of native coronary artery with other forms of angina pectoris: Secondary | ICD-10-CM

## 2020-09-25 DIAGNOSIS — E782 Mixed hyperlipidemia: Secondary | ICD-10-CM

## 2020-09-25 DIAGNOSIS — I700 Atherosclerosis of aorta: Secondary | ICD-10-CM

## 2020-09-25 NOTE — Telephone Encounter (Signed)
-----   Message from Werner Lean, MD sent at 09/24/2020  8:03 AM EDT ----- Results: Increase in LDL Plan: Confirm patient atorvastatin; if so we will add zetia 10 mg PO daily  Werner Lean, MD

## 2020-09-25 NOTE — Telephone Encounter (Signed)
When I called pt she expressed that she would be having dental work and needed MD to write a letter saying she is ok for dental work.  I requested that pt have Dental office fax over a dental clearance.  She expressed that she asked for it and was told they don't have one. I called Dr. Carlye Grippe office [phone]. (Fax [phone] to inquire.  I was told that pt does not have a treatment plan and will see dentist in Oct.  I asked that office send over a dental clearance once plan is in place.  I called pt and informed her of the above.

## 2020-09-25 NOTE — Telephone Encounter (Signed)
Called pt reviewed results and MD recommendations.  She reports that she does not take atorvastatin regularly.  She will start to take medication daily and come in for f/u labs on 12/26/20.  No further questions or concerns.

## 2020-09-26 ENCOUNTER — Other Ambulatory Visit: Payer: Self-pay

## 2020-09-26 ENCOUNTER — Ambulatory Visit: Payer: Medicare Other | Attending: Adult Health

## 2020-09-26 DIAGNOSIS — M6281 Muscle weakness (generalized): Secondary | ICD-10-CM | POA: Diagnosis not present

## 2020-09-26 DIAGNOSIS — G8929 Other chronic pain: Secondary | ICD-10-CM | POA: Insufficient documentation

## 2020-09-26 DIAGNOSIS — M25562 Pain in left knee: Secondary | ICD-10-CM | POA: Diagnosis not present

## 2020-09-26 DIAGNOSIS — R2689 Other abnormalities of gait and mobility: Secondary | ICD-10-CM | POA: Diagnosis not present

## 2020-09-26 DIAGNOSIS — R2681 Unsteadiness on feet: Secondary | ICD-10-CM | POA: Diagnosis not present

## 2020-09-26 NOTE — Therapy (Signed)
[facility] [address] [place], [place], [address] Phone: [phone]   Fax:  [phone]  Physical Therapy Evaluation  Patient Details  Name: Sarah [name] MRN: [id number] Date of Birth: September 25, 1945 Referring Provider (PT): Frann Rider, NP   Encounter Date: 09/26/2020   PT End of Session - 09/26/20 0938     Visit Number 1    Number of Visits 17    Date for PT Re-Evaluation 11/21/20    Authorization Type [employer] Medicare - 10th visit PN    PT Start Time 0934    PT Stop Time 1016    PT Time Calculation (min) 42 min    Equipment Utilized During Treatment Gait belt    Activity Tolerance Patient tolerated treatment well    Behavior During Therapy [facility] for tasks assessed/performed             Past Medical History:  Diagnosis Date   Bilateral carpal tunnel syndrome 03/27/2019   Boils    Glaucoma    Heart murmur    HTN (hypertension)    Hx of adenomatous colonic polyps    Hyperlipidemia    Osteoporosis    Pneumonia    Stroke ([place])    Type II or unspecified type diabetes mellitus without mention of complication, uncontrolled     Past Surgical History:  Procedure Laterality Date   ABDOMINAL HYSTERECTOMY     CARPAL TUNNEL RELEASE     right   COLONOSCOPY  12-10-10   per Dr. Deatra Ina, clear, repeat in 7 yrs    CORONARY STENT INTERVENTION N/A 03/11/2020   Procedure: CORONARY STENT INTERVENTION;  Surgeon: Jettie Booze, MD;  Location: [facility];  Service: Cardiovascular;  Laterality: N/A;   INCISION AND DRAINAGE PERIRECTAL ABSCESS N/A 09/26/2015   Procedure: IRRIGATION AND DEBRIDEMENT PERIRECTAL ABSCESS;  Surgeon: Mickeal Skinner, MD;  Location: [facility];  Service: General;  Laterality: N/A;   INTRAVASCULAR ULTRASOUND/IVUS N/A 03/11/2020   Procedure: Intravascular Ultrasound/IVUS;  Surgeon: Jettie Booze, MD;  Location: [facility];  Service: Cardiovascular;  Laterality: N/A;    KNEE ARTHROSCOPY     right   LEFT HEART CATH AND CORONARY ANGIOGRAPHY N/A 03/11/2020   Procedure: LEFT HEART CATH AND CORONARY ANGIOGRAPHY;  Surgeon: Jettie Booze, MD;  Location: [facility];  Service: Cardiovascular;  Laterality: N/A;   LOOP RECORDER INSERTION N/A 10/19/2016   Procedure: LOOP RECORDER INSERTION;  Surgeon: Thompson Grayer, MD;  Location: [facility];  Service: Cardiovascular;  Laterality: N/A;   ORIF ANKLE FRACTURE Right 03/24/2012   Procedure: OPEN REDUCTION INTERNAL FIXATION (ORIF) ANKLE FRACTURE;  Surgeon: Newt Minion, MD;  Location: [facility];  Service: Orthopedics;  Laterality: Right;  Open Reduction Internal Fixation Right Bimalleolar ankle fracture   POLYPECTOMY     TEE WITHOUT CARDIOVERSION N/A 10/18/2016   Procedure: TRANSESOPHAGEAL ECHOCARDIOGRAM (TEE);  Surgeon: Fay Records, MD;  Location: [facility];  Service: Cardiovascular;  Laterality: N/A;   TONSILLECTOMY      There were no vitals filed for this visit.    Subjective Assessment - 09/26/20 0939     Subjective Pt has history of right MCA 10/2016 readmitted 02/08/2018 for right parieto-occipital stroke. Pt reports that she saw eye doctor recently and her pressure is up. She has had cataract surgery with not much change. History of left homonymous hemianopsia. Pt having some balance issues and left knee pain. Pt reports that she twisted her knee on  stairs a couple months ago. She caught herself but knee has bothered her since. Reports MRI just showed arthritis. Reports pain when up on it. She did not bring her cane today but does use it a lot when goes out of house. She does drive. Denies any recent falls.    Patient Stated Goals Pt would like to improve her balance and decrease knee pain. Also wants to improve her memory.    Currently in Pain? Yes    Pain Score 4     Pain Location Knee    Pain Orientation Left    Pain Descriptors / Indicators Dull    Pain Type Chronic pain    Pain Onset More  than a month ago    Pain Frequency Constant    Aggravating Factors  when up on it alot    Pain Relieving Factors voltaren gel                [facility] PT Assessment - 09/26/20 0946       Assessment   Medical Diagnosis imbalance due to old stroke, chronic pain in left knee    Referring Provider (PT) Frann Rider, NP    Onset Date/Surgical Date 09/11/20    Hand Dominance Right    Prior Therapy Last therapy was in 2021 in outpatient      Precautions   Precautions Fall      Balance Screen   Has the patient fallen in the past 6 months No    Has the patient had a decrease in activity level because of a fear of falling?  No    Is the patient reluctant to leave their home because of a fear of falling?  No      Home Environment   Living Environment Private residence    Living Arrangements Parent    Available Help at Discharge Family    Type of [place] Access Level entry    [place] Two level    Alternate Level Stairs-Number of Steps 15    Alternate Level Stairs-Rails Right;Left   one side then switches to other   [place] - 4 wheels;Cane - single point;Grab bars - tub/shower    Additional Comments manages mothers medication      Prior Function   Level of Independence Independent with basic ADLs;Independent with homemaking with ambulation;Independent with household mobility with device;Independent with community mobility with device    Vocation Retired    Leisure Cares for 73 yo mother, enjoys walking outside; shopping, bowling, puzzles      Cognition   Overall Cognitive Status Within Functional Limits for tasks assessed   pt does report some memory issues at times as finds she has some trouble concentrating and really has to think about things     Observation/Other Assessments   Observations Pt's peripheral vision was intact, did seem to have some diffiiculty with seeing targets on left compared to right. Smooth pursuit intact.      Sensation    Light Touch Appears Intact      Posture/Postural Control   Posture/Postural Control Postural limitations    Postural Limitations Forward head;Rounded Shoulders      ROM / Strength   AROM / PROM / Strength Strength      Strength   Strength Assessment Site Shoulder;Elbow;Hip;Knee;Ankle    Right/Left Shoulder Right;Left    Right Shoulder Flexion 4/5    Left Shoulder Flexion 4/5    Right/Left Elbow Right;Left  Right Elbow Flexion 5/5    Right Elbow Extension 5/5    Left Elbow Flexion 4/5    Left Elbow Extension 4+/5    Right/Left Hip Right;Left    Right Hip Flexion 4/5    Right Hip ABduction 4/5    Left Hip Flexion 4/5   pain medial knee   Left Hip ABduction 4/5    Right/Left Knee Right;Left    Right Knee Flexion 5/5    Right Knee Extension 5/5    Left Knee Flexion 4/5   pain medial knee   Left Knee Extension 4/5   pain medial knee   Right/Left Ankle Right;Left    Right Ankle Dorsiflexion 4+/5    Left Ankle Dorsiflexion 4/5      Palpation   Palpation comment Tender to touch at left medial joint line.      Transfers   Transfers Sit to Stand;Stand to Sit    Sit to Stand 5: Supervision;6: Modified independent (Device/Increase time)    Five time sit to stand comments  15.87 sec from chair without hands    Stand to Sit 5: Supervision;6: Modified independent (Device/Increase time)    Comments tends to lean posterior some upon initial standing      Ambulation/Gait   Ambulation/Gait Yes    Ambulation/Gait Assistance 5: Supervision    Ambulation Distance (Feet) 150 Feet    Assistive device None    Gait Pattern Step-through pattern;Decreased stance time - left;Decreased step length - right;Antalgic    Gait velocity 14.03 sec=0.76ms or 2.338fsec    Stairs Yes    Stairs Assistance 5: Supervision    Stairs Assistance Details (indicate cue type and reason) step-to up and reciprocal down but reported pain with lowering with LLE and was more challenging    Stair Management  Technique Alternating pattern;Step to pattern;Two rails    Number of Stairs 4    Height of Stairs 6      Functional Gait  Assessment   Gait assessed  Yes    Gait Level Surface Walks 20 ft in less than 7 sec but greater than 5.5 sec, uses assistive device, slower speed, mild gait deviations, or deviates 6-10 in outside of the 12 in walkway width.    Change in Gait Speed Able to change speed, demonstrates mild gait deviations, deviates 6-10 in outside of the 12 in walkway width, or no gait deviations, unable to achieve a major change in velocity, or uses a change in velocity, or uses an assistive device.    Gait with Horizontal Head Turns Performs head turns smoothly with slight change in gait velocity (eg, minor disruption to smooth gait path), deviates 6-10 in outside 12 in walkway width, or uses an assistive device.    Gait with Vertical Head Turns Performs head turns with no change in gait. Deviates no more than 6 in outside 12 in walkway width.    Gait and Pivot Turn Pivot turns safely in greater than 3 sec and stops with no loss of balance, or pivot turns safely within 3 sec and stops with mild imbalance, requires small steps to catch balance.    Step Over Obstacle Is able to step over one shoe box (4.5 in total height) but must slow down and adjust steps to clear box safely. May require verbal cueing.    Gait with Narrow Base of Support Ambulates less than 4 steps heel to toe or cannot perform without assistance.    Gait with Eyes Closed Walks 20 ft,  slow speed, abnormal gait pattern, evidence for imbalance, deviates 10-15 in outside 12 in walkway width. Requires more than 9 sec to ambulate 20 ft.    Ambulating Backwards Walks 20 ft, uses assistive device, slower speed, mild gait deviations, deviates 6-10 in outside 12 in walkway width.    Steps Two feet to a stair, must use rail.    Total Score 16    FGA comment: high fall risk                        Objective measurements  completed on examination: See above findings.                PT Education - 09/26/20 1852     Education Details PT plan of care    Person(s) Educated Patient    Methods Explanation    Comprehension Verbalized understanding              PT Short Term Goals - 09/26/20 1903       PT SHORT TERM GOAL #1   Title Pt will be independent with HEP for improved strength, balance, transfers, and gait including aquatics as needed.    Time 4    Period Weeks    Status New    Target Date 10/24/20      PT SHORT TERM GOAL #2   Title Pt will decrease 5 x sit to stand to <13 sec for improved balance and functional mobility.    Baseline 09/26/20    Time 4    Period Weeks    Status New    Target Date 10/24/20      PT SHORT TERM GOAL #3   Title Pt will increase FGA from 16/30 to >19/30 for improved balance and gait.    Baseline 09/26/20 16/30    Time 4    Period Weeks    Status New    Target Date 10/24/20               PT Long Term Goals - 09/26/20 1907       PT LONG TERM GOAL #1   Title Pt will report <4/10 pain in left knee with activities for improved function. (LTGs due 11/21/20)    Baseline 4/10    Time 8    Period Weeks    Status New    Target Date 11/21/20      PT LONG TERM GOAL #2   Title Pt will increase gait speed to >0.77ms for improved community ambulation.    Baseline 09/26/20 0.718m    Time 8    Period Weeks    Status New    Target Date 12/12/20      PT LONG TERM GOAL #3   Title Patient to improve FGA to >/= 24/30 demonstrating improved dynamic balance:    Baseline 09/26/20 16/30    Time 8    Period Weeks    Status New    Target Date 11/21/20      PT LONG TERM GOAL #4   Title Pt will ambulate >1000' with cane versus no AD on varied surfaces mod I for improved community mobility.    Time 8    Period Weeks    Status New    Target Date 11/21/20      PT LONG TERM GOAL #5   Title Lower Extremity Functional Scale goal TBD    Time 8     Period Weeks    Status  New    Target Date 11/21/20                    Plan - 09/26/20 1853     Clinical Impression Statement Pt is 75 y/o female with history of 2 CVAs. Pt reports she continues to have some imbalance and seems worse since twisted knee a couple months ago on steps with new onset left knee pain. Tender to palpations at medial joint line. Pain is worse with increased weight bearing. Pt has some left hemiparesis throughout grossly 4/5 in LLE. Pt is fall risk based on 5 x sit to stand of 15.87 sec and FGA score of 16/30. She was ambulating without AD with antalgic gait on left with gait speed of 0.57ms indicating decreased safety with community mobility. Pt will benefit from skilled PT to address strength, balance, left knee pain and functional mobility deficits.    Personal Factors and Comorbidities Comorbidity 3+    Comorbidities DM2, CVA, HTN, osteoporosis, glaucoma, L visual field deficits,right MCA 10/2016 readmitted 02/08/2018 for right parieto-occipital stroke    Examination-Activity Limitations Locomotion Level;Transfers;Stand;Stairs    Examination-Participation Restrictions Community Activity;Shop    Stability/Clinical Decision Making Stable/Uncomplicated    Clinical Decision Making Moderate    Rehab Potential Good    PT Frequency 2x / week   plus eval   PT Duration 8 weeks    PT Treatment/Interventions ADLs/Self Care Home Management;DME Instruction;Gait training;Functional mobility training;Therapeutic activities;Therapeutic exercise;Balance training;Neuromuscular re-education;Patient/family education;Aquatic Therapy;Manual techniques;Cryotherapy;Moist Heat;Passive range of motion    PT Next Visit Plan Initiate HEP to address balance:  corner balance, compliant surfaces, functional lower extremity strengthening as tolerated based on left knee. Left hip strengthening. May try to get in pool. Let pt know address of pool next time and she will most likely want to try.  Collect LEFS next visit that I sent home with pt and update LTG. Does pt want ST referral?    Consulted and Agree with Plan of Care Patient             Patient will benefit from skilled therapeutic intervention in order to improve the following deficits and impairments:  Abnormal gait, Difficulty walking, Decreased balance, Decreased mobility, Decreased strength, Pain  Visit Diagnosis: Other abnormalities of gait and mobility  Muscle weakness (generalized)  Unsteadiness on feet  Chronic pain of left knee     Problem List Patient Active Problem List   Diagnosis Date Noted   Coronary artery disease of native artery of native heart with stable angina pectoris ([facility] 03/04/2020   Diabetes mellitus ([facility] 03/03/2020   Aortic atherosclerosis ([facility] 02/12/2020   Bilateral leg cramps 01/21/2020   Anxiety disorder 01/21/2020   Type 2 diabetes mellitus with hyperglycemia, with long-term current use of insulin ([facility] 08/15/2019   Type 2 diabetes mellitus with diabetic polyneuropathy, with long-term current use of insulin ([facility] 08/15/2019   Type 2 diabetes mellitus with microalbuminuria, with long-term current use of insulin ([facility] 08/15/2019   Hyperlipidemia LDL goal <70 08/15/2019   Bilateral carpal tunnel syndrome 03/27/2019   Osteoarthritis of both knees 11/27/2018   CKD (chronic kidney disease), stage II 11/27/2018   GERD (gastroesophageal reflux disease) 06/26/2018   Homonymous hemianopia, left 02/08/2018   Diabetes mellitus with coincident hypertension (HCC)    Trigger finger, right index finger 10/17/2017   Low back pain 07/29/2017   Carotid artery stenosis 11/02/2016   Left arm weakness    Diastolic dysfunction    Hypertension with heart disease  Acute blood loss anemia    Cerebral infarction due to embolism of right middle cerebral artery ([place])    Perirectal abscess 09/26/2015   Vitamin D deficiency 07/23/2015   Osteopenia 02/16/2013   Ankle fracture 01/19/2013    Anemia 09/27/2011   Type II diabetes mellitus with nephropathy ([place]) 09/24/2011   Mixed hyperlipidemia 09/24/2011    Electa Sniff, PT, DPT, NCS 09/26/2020, 7:12 PM  [facility] [address] [place], [place], [address] Phone: [phone]   Fax:  [phone]  Name: PRAGATHI CUE MRN: [id number] Date of Birth: 07/11/1945

## 2020-10-01 ENCOUNTER — Other Ambulatory Visit: Payer: Self-pay

## 2020-10-01 ENCOUNTER — Ambulatory Visit: Payer: Medicare Other | Admitting: Physical Therapy

## 2020-10-01 ENCOUNTER — Encounter: Payer: Self-pay | Admitting: Physical Therapy

## 2020-10-01 DIAGNOSIS — G8929 Other chronic pain: Secondary | ICD-10-CM

## 2020-10-01 DIAGNOSIS — M6281 Muscle weakness (generalized): Secondary | ICD-10-CM | POA: Diagnosis not present

## 2020-10-01 DIAGNOSIS — R2681 Unsteadiness on feet: Secondary | ICD-10-CM | POA: Diagnosis not present

## 2020-10-01 DIAGNOSIS — M25562 Pain in left knee: Secondary | ICD-10-CM | POA: Diagnosis not present

## 2020-10-01 DIAGNOSIS — R2689 Other abnormalities of gait and mobility: Secondary | ICD-10-CM

## 2020-10-01 NOTE — Patient Instructions (Signed)
Access Code: [email or web address] URL: [email or web address] Date: 10/01/2020 Prepared by: Willow Ora  Exercises Sit to Stand without Arm Support - 1 x daily - 7 x weekly - 10 reps - 2 sets Walking March - 1 x daily - 5 x weekly - 2-3 reps - 1 sets Tandem Walking with Counter Support - 1 x daily - 5 x weekly - 1 sets - 2-3 reps Standing Single Leg Stance with Counter Support - 1 x daily - 5 x weekly - 1 sets - 3 reps - 10 hold Standing Near Stance in Corner with Eyes Closed - 1 x daily - 5 x weekly - 1 sets - 3 reps - 30 sec's hold Standing Balance in Corner with Eyes Closed Head Movements - 1 x daily - 5 x weekly - 1 sets - 10 reps

## 2020-10-02 ENCOUNTER — Telehealth: Payer: Self-pay

## 2020-10-02 DIAGNOSIS — E1165 Type 2 diabetes mellitus with hyperglycemia: Secondary | ICD-10-CM | POA: Diagnosis not present

## 2020-10-02 DIAGNOSIS — R413 Other amnesia: Secondary | ICD-10-CM

## 2020-10-02 DIAGNOSIS — Z794 Long term (current) use of insulin: Secondary | ICD-10-CM | POA: Diagnosis not present

## 2020-10-02 NOTE — Telephone Encounter (Signed)
Thank you :)

## 2020-10-02 NOTE — Therapy (Signed)
[facility] [address] [place], [place], [address] Phone: [phone]   Fax:  [phone]  Physical Therapy Treatment  Patient Details  Name: Sarah [name] MRN: [id number] Date of Birth: September 06, 1945 Referring Provider (PT): Frann Rider, NP   Encounter Date: 10/01/2020   PT End of Session - 10/01/20 0819     Visit Number 2    Number of Visits 17    Date for PT Re-Evaluation 11/21/20    Authorization Type UHC Medicare - 10th visit PN    PT Start Time 0805    PT Stop Time 0845    PT Time Calculation (min) 40 min    Equipment Utilized During Treatment Gait belt    Activity Tolerance Patient tolerated treatment well    Behavior During Therapy [facility] for tasks assessed/performed             Past Medical History:  Diagnosis Date   Bilateral carpal tunnel syndrome 03/27/2019   Boils    Glaucoma    Heart murmur    HTN (hypertension)    Hx of adenomatous colonic polyps    Hyperlipidemia    Osteoporosis    Pneumonia    Stroke ([place])    Type II or unspecified type diabetes mellitus without mention of complication, uncontrolled     Past Surgical History:  Procedure Laterality Date   ABDOMINAL HYSTERECTOMY     CARPAL TUNNEL RELEASE     right   COLONOSCOPY  12-10-10   per Dr. Deatra Ina, clear, repeat in 7 yrs    CORONARY STENT INTERVENTION N/A 03/11/2020   Procedure: CORONARY STENT INTERVENTION;  Surgeon: Jettie Booze, MD;  Location: [facility];  Service: Cardiovascular;  Laterality: N/A;   INCISION AND DRAINAGE PERIRECTAL ABSCESS N/A 09/26/2015   Procedure: IRRIGATION AND DEBRIDEMENT PERIRECTAL ABSCESS;  Surgeon: Mickeal Skinner, MD;  Location: [facility];  Service: General;  Laterality: N/A;   INTRAVASCULAR ULTRASOUND/IVUS N/A 03/11/2020   Procedure: Intravascular Ultrasound/IVUS;  Surgeon: Jettie Booze, MD;  Location: [facility];  Service: Cardiovascular;  Laterality: N/A;   KNEE  ARTHROSCOPY     right   LEFT HEART CATH AND CORONARY ANGIOGRAPHY N/A 03/11/2020   Procedure: LEFT HEART CATH AND CORONARY ANGIOGRAPHY;  Surgeon: Jettie Booze, MD;  Location: [facility];  Service: Cardiovascular;  Laterality: N/A;   LOOP RECORDER INSERTION N/A 10/19/2016   Procedure: LOOP RECORDER INSERTION;  Surgeon: Thompson Grayer, MD;  Location: [facility];  Service: Cardiovascular;  Laterality: N/A;   ORIF ANKLE FRACTURE Right 03/24/2012   Procedure: OPEN REDUCTION INTERNAL FIXATION (ORIF) ANKLE FRACTURE;  Surgeon: Newt Minion, MD;  Location: [facility];  Service: Orthopedics;  Laterality: Right;  Open Reduction Internal Fixation Right Bimalleolar ankle fracture   POLYPECTOMY     TEE WITHOUT CARDIOVERSION N/A 10/18/2016   Procedure: TRANSESOPHAGEAL ECHOCARDIOGRAM (TEE);  Surgeon: Fay Records, MD;  Location: [facility];  Service: Cardiovascular;  Laterality: N/A;   TONSILLECTOMY      There were no vitals filed for this visit.   Subjective Assessment - 10/01/20 0809     Subjective No new complaints. No falls. Having left knee pain, forgot to put on her cream this morining due to rushing to get here. Pt is interested in ST referral and aquatics as well. Forgot to bring the LEFS back today.    Patient Stated Goals Pt would like to improve her balance and decrease knee pain. Also  wants to improve her memory.    Currently in Pain? Yes    Pain Score 5     Pain Location Knee    Pain Orientation Left    Pain Descriptors / Indicators Aching;Dull    Pain Type Chronic pain    Pain Onset More than a month ago    Aggravating Factors  increased activity, being on it alot    Pain Relieving Factors voltaren gel                 [facility] Adult PT Treatment/Exercise - 10/01/20 0821       Transfers   Transfers Sit to Stand;Stand to Sit    Sit to Stand 5: Supervision;With upper extremity assist;From chair/3-in-1    Stand to Sit 5: Supervision;With upper extremity assist;To  bed;To chair/3-in-1      Ambulation/Gait   Ambulation/Gait Yes    Ambulation/Gait Assistance 5: Supervision    Ambulation Distance (Feet) --   around clinic with session   Assistive device None    Gait Pattern Step-through pattern;Decreased stance time - left;Decreased step length - right;Antalgic    Ambulation Surface Level;Indoor      Self-Care   Self-Care Other Self-Care Comments    Other Self-Care Comments  issued LEFS survey in session today as pt forgot to complete it at home. score of 40/80= 50% disability.      Exercises   Exercises Other Exercises    Other Exercises  reviewed HEP from previoous episode of care ~1 year ago. Revised program and issues ex's for strengthening and balance. No issues noted or reported with performance in session. Min guard assist for balance. Refer to [facility] for full details.             issued to HEP this session: Access Code: [email or web address] URL: [email or web address] Date: 10/01/2020 Prepared by: Willow Ora  Exercises Sit to Stand without Arm Support - 1 x daily - 7 x weekly - 10 reps - 2 sets Walking March - 1 x daily - 5 x weekly - 2-3 reps - 1 sets Tandem Walking with Counter Support - 1 x daily - 5 x weekly - 1 sets - 2-3 reps Standing Single Leg Stance with Counter Support - 1 x daily - 5 x weekly - 1 sets - 3 reps - 10 hold Standing Near Stance in Corner with Eyes Closed - 1 x daily - 5 x weekly - 1 sets - 3 reps - 30 sec's hold Standing Balance in Corner with Eyes Closed Head Movements - 1 x daily - 5 x weekly - 1 sets - 10 reps        PT Education - 10/01/20 1630     Education Details results of LEFS scale; updated HEP    Person(s) Educated Patient    Methods Explanation;Demonstration;Verbal cues;Handout    Comprehension Verbalized understanding;Returned demonstration;Verbal cues required;Need further instruction              PT Short Term Goals - 09/26/20 1903       PT SHORT TERM GOAL #1   Title Pt  will be independent with HEP for improved strength, balance, transfers, and gait including aquatics as needed.    Time 4    Period Weeks    Status New    Target Date 10/24/20      PT SHORT TERM GOAL #2   Title Pt will decrease 5 x sit to stand to <13 sec for improved balance and functional mobility.  Baseline 09/26/20    Time 4    Period Weeks    Status New    Target Date 10/24/20      PT SHORT TERM GOAL #3   Title Pt will increase FGA from 16/30 to >19/30 for improved balance and gait.    Baseline 09/26/20 16/30    Time 4    Period Weeks    Status New    Target Date 10/24/20               PT Long Term Goals - 09/26/20 1907       PT LONG TERM GOAL #1   Title Pt will report <4/10 pain in left knee with activities for improved function. (LTGs due 11/21/20)    Baseline 4/10    Time 8    Period Weeks    Status New    Target Date 11/21/20      PT LONG TERM GOAL #2   Title Pt will increase gait speed to >0.15m/s for improved community ambulation.    Baseline 09/26/20 0.34m/s    Time 8    Period Weeks    Status New    Target Date 12/12/20      PT LONG TERM GOAL #3   Title Patient to improve FGA to >/= 24/30 demonstrating improved dynamic balance:    Baseline 09/26/20 16/30    Time 8    Period Weeks    Status New    Target Date 11/21/20      PT LONG TERM GOAL #4   Title Pt will ambulate >1000' with cane versus no AD on varied surfaces mod I for improved community mobility.    Time 8    Period Weeks    Status New    Target Date 11/21/20      PT LONG TERM GOAL #5   Title Lower Extremity Functional Scale goal TBD    Time 8    Period Weeks    Status New    Target Date 11/21/20                   Plan - 10/01/20 0819     Clinical Impression Statement Today's skilled session focused on revising pt's HEP from previous episode of care. No issues noted or reported in session.The pt is progressing toward goals and should benefit from continued PT to  progress toward unmet goals.    Personal Factors and Comorbidities Comorbidity 3+    Comorbidities DM2, CVA, HTN, osteoporosis, glaucoma, L visual field deficits,right MCA 10/2016 readmitted 02/08/2018 for right parieto-occipital stroke    Examination-Activity Limitations Locomotion Level;Transfers;Stand;Stairs    Examination-Participation Restrictions Community Activity;Shop    Stability/Clinical Decision Making Stable/Uncomplicated    Rehab Potential Good    PT Frequency 2x / week   plus eval   PT Duration 8 weeks    PT Treatment/Interventions ADLs/Self Care Home Management;DME Instruction;Gait training;Functional mobility training;Therapeutic activities;Therapeutic exercise;Balance training;Neuromuscular re-education;Patient/family education;Aquatic Therapy;Manual techniques;Cryotherapy;Moist Heat;Passive range of motion    PT Next Visit Plan continue with LE strengthening, specifically left hip. balance training being mindful of pain. progress toward pool appt's?    PT Home Exercise Plan Access Code: [email or web address]    Consulted and Agree with Plan of Care Patient             Patient will benefit from skilled therapeutic intervention in order to improve the following deficits and impairments:  Abnormal gait, Difficulty walking, Decreased balance, Decreased mobility, Decreased strength, Pain  Visit Diagnosis:  Other abnormalities of gait and mobility  Muscle weakness (generalized)  Unsteadiness on feet  Chronic pain of left knee     Problem List Patient Active Problem List   Diagnosis Date Noted   Coronary artery disease of native artery of native heart with stable angina pectoris ([place]) 03/04/2020   Diabetes mellitus ([place]) 03/03/2020   Aortic atherosclerosis ([place]) 02/12/2020   Bilateral leg cramps 01/21/2020   Anxiety disorder 01/21/2020   Type 2 diabetes mellitus with hyperglycemia, with long-term current use of insulin ([place]) 08/15/2019   Type 2 diabetes mellitus with diabetic  polyneuropathy, with long-term current use of insulin ([place]) 08/15/2019   Type 2 diabetes mellitus with microalbuminuria, with long-term current use of insulin ([place]) 08/15/2019   Hyperlipidemia LDL goal <70 08/15/2019   Bilateral carpal tunnel syndrome 03/27/2019   Osteoarthritis of both knees 11/27/2018   CKD (chronic kidney disease), stage II 11/27/2018   GERD (gastroesophageal reflux disease) 06/26/2018   Homonymous hemianopia, left 02/08/2018   Diabetes mellitus with coincident hypertension (HCC)    Trigger finger, right index finger 10/17/2017   Low back pain 07/29/2017   Carotid artery stenosis 11/02/2016   Left arm weakness    Diastolic dysfunction    Hypertension with heart disease    Acute blood loss anemia    Cerebral infarction due to embolism of right middle cerebral artery ([place])    Perirectal abscess 09/26/2015   Vitamin D deficiency 07/23/2015   Osteopenia 02/16/2013   Ankle fracture 01/19/2013   Anemia 09/27/2011   Type II diabetes mellitus with nephropathy ([place]) 09/24/2011   Mixed hyperlipidemia 09/24/2011    Willow Ora, PTA 10/02/2020, 1:04 PM  [facility] [address] [place], [place], [address] Phone: [phone]   Fax:  [phone]  Name: LAMISHA ROUSSELL MRN: [id number] Date of Birth: 18-Feb-1945

## 2020-10-02 NOTE — Telephone Encounter (Signed)
Frann Rider, Kaiana Marion was evaluated by PT on 09/26/20.  The patient would benefit from ST evaluation for cognitive assessment with reported memory changes.   If you agree, please place an order in [facility] [place] [facility]que in [facility] or fax the order to [phone]. Thank you, Cherly Anderson, PT, DPT, [facility] [address] [place], [place]  [address] Phone:  [phone] Fax:  [phone]

## 2020-10-03 ENCOUNTER — Encounter: Payer: Self-pay | Admitting: Physical Therapy

## 2020-10-03 ENCOUNTER — Other Ambulatory Visit: Payer: Self-pay

## 2020-10-03 ENCOUNTER — Encounter: Payer: Self-pay | Admitting: Gastroenterology

## 2020-10-03 ENCOUNTER — Ambulatory Visit: Payer: Medicare Other | Admitting: Physical Therapy

## 2020-10-03 DIAGNOSIS — R2689 Other abnormalities of gait and mobility: Secondary | ICD-10-CM | POA: Diagnosis not present

## 2020-10-03 DIAGNOSIS — M25562 Pain in left knee: Secondary | ICD-10-CM | POA: Diagnosis not present

## 2020-10-03 DIAGNOSIS — M6281 Muscle weakness (generalized): Secondary | ICD-10-CM

## 2020-10-03 DIAGNOSIS — G8929 Other chronic pain: Secondary | ICD-10-CM | POA: Diagnosis not present

## 2020-10-03 DIAGNOSIS — R2681 Unsteadiness on feet: Secondary | ICD-10-CM

## 2020-10-03 NOTE — Patient Instructions (Signed)
Access Code: [email or web address] URL: [email or web address] Date: 10/03/2020 Prepared by: Janann August  Exercises Sit to Stand without Arm Support - 1 x daily - 7 x weekly - 10 reps - 2 sets Walking March - 1 x daily - 5 x weekly - 2-3 reps - 1 sets Tandem Walking with Counter Support - 1 x daily - 5 x weekly - 1 sets - 2-3 reps Standing Single Leg Stance with Counter Support - 1 x daily - 5 x weekly - 1 sets - 3 reps - 10 hold Standing Near Stance in Corner with Eyes Closed - 1 x daily - 5 x weekly - 1 sets - 3 reps - 30 sec's hold Standing Balance in Corner with Eyes Closed Head Movements - 1 x daily - 5 x weekly - 1 sets - 10 reps Standing Hip Abduction with Counter Support - 2 x daily - 5 x weekly - 1 sets - 10 reps

## 2020-10-03 NOTE — Therapy (Signed)
[facility] [address] [place], [place], [address] Phone: [phone]   Fax:  [phone]  Physical Therapy Treatment  Patient Details  Name: Sarah [name] MRN: [id number] Date of Birth: 02-Mar-1945 Referring Provider (PT): Frann Rider, NP   Encounter Date: 10/03/2020   PT End of Session - 10/03/20 1403     Visit Number 3    Number of Visits 17    Date for PT Re-Evaluation 11/21/20    Authorization Type UHC Medicare - 10th visit PN    PT Start Time 1321    PT Stop Time 1402    PT Time Calculation (min) 41 min    Equipment Utilized During Treatment Gait belt    Activity Tolerance Patient tolerated treatment well    Behavior During Therapy [facility] for tasks assessed/performed             Past Medical History:  Diagnosis Date   Bilateral carpal tunnel syndrome 03/27/2019   Boils    Glaucoma    Heart murmur    HTN (hypertension)    Hx of adenomatous colonic polyps    Hyperlipidemia    Osteoporosis    Pneumonia    Stroke ([place])    Type II or unspecified type diabetes mellitus without mention of complication, uncontrolled     Past Surgical History:  Procedure Laterality Date   ABDOMINAL HYSTERECTOMY     CARPAL TUNNEL RELEASE     right   COLONOSCOPY  12-10-10   per Dr. Deatra Ina, clear, repeat in 7 yrs    CORONARY STENT INTERVENTION N/A 03/11/2020   Procedure: CORONARY STENT INTERVENTION;  Surgeon: Jettie Booze, MD;  Location: [facility];  Service: Cardiovascular;  Laterality: N/A;   INCISION AND DRAINAGE PERIRECTAL ABSCESS N/A 09/26/2015   Procedure: IRRIGATION AND DEBRIDEMENT PERIRECTAL ABSCESS;  Surgeon: Mickeal Skinner, MD;  Location: [facility];  Service: General;  Laterality: N/A;   INTRAVASCULAR ULTRASOUND/IVUS N/A 03/11/2020   Procedure: Intravascular Ultrasound/IVUS;  Surgeon: Jettie Booze, MD;  Location: [facility];  Service: Cardiovascular;  Laterality: N/A;   KNEE  ARTHROSCOPY     right   LEFT HEART CATH AND CORONARY ANGIOGRAPHY N/A 03/11/2020   Procedure: LEFT HEART CATH AND CORONARY ANGIOGRAPHY;  Surgeon: Jettie Booze, MD;  Location: [facility];  Service: Cardiovascular;  Laterality: N/A;   LOOP RECORDER INSERTION N/A 10/19/2016   Procedure: LOOP RECORDER INSERTION;  Surgeon: Thompson Grayer, MD;  Location: [facility];  Service: Cardiovascular;  Laterality: N/A;   ORIF ANKLE FRACTURE Right 03/24/2012   Procedure: OPEN REDUCTION INTERNAL FIXATION (ORIF) ANKLE FRACTURE;  Surgeon: Newt Minion, MD;  Location: [facility];  Service: Orthopedics;  Laterality: Right;  Open Reduction Internal Fixation Right Bimalleolar ankle fracture   POLYPECTOMY     TEE WITHOUT CARDIOVERSION N/A 10/18/2016   Procedure: TRANSESOPHAGEAL ECHOCARDIOGRAM (TEE);  Surgeon: Fay Records, MD;  Location: [facility];  Service: Cardiovascular;  Laterality: N/A;   TONSILLECTOMY      There were no vitals filed for this visit.   Subjective Assessment - 10/03/20 1323     Subjective No falls. Forgot her cane today. Has done the exercises once, does not have any questions.    Patient Stated Goals Pt would like to improve her balance and decrease knee pain. Also wants to improve her memory.    Currently in Pain? Yes    Pain Score 4     Pain Location  Knee    Pain Orientation Left    Pain Descriptors / Indicators Aching;Dull    Pain Type Chronic pain    Pain Onset More than a month ago    Aggravating Factors  nothing    Pain Relieving Factors voltaren gel                               [facility] Adult PT Treatment/Exercise - 10/03/20 1332       Exercises   Exercises Knee/Hip      Knee/Hip Exercises: Aerobic   Nustep With BLE/BUE for strengthening, ROM at gear 2 for 6 minutes, keeping spm at 40      Knee/Hip Exercises: Standing   Hip Abduction Stengthening;AROM;Both;1 set;10 reps;Knee straight;Limitations    Abduction Limitations alternating  sides with BUE support, cues for foot position, added to HEP      Knee/Hip Exercises: Supine   Bridges Strengthening;Both;1 set;10 reps    Bridges Limitations initial cues for technique and ROM    Other Supine Knee/Hip Exercises seated pillow squeeze x10 reps      Knee/Hip Exercises: Sidelying   Clams 2 X 10 REPS LLE, cues for technique and ROM                 Balance Exercises - 10/03/20 1359       Balance Exercises: Standing   Standing Eyes Closed Foam/compliant surface;Wide (BOA);3 reps;30 secs;Limitations    Standing Eyes Closed Limitations on air ex    Tandem Gait Forward;3 reps;Limitations    Tandem Gait Limitations intermittent taps to bars for balance    Other Standing Exercises on air ex: heel toe raises, lateral weight shifting x10 reps each, UE support > none    Other Standing Exercises Comments tt                PT Education - 10/03/20 1403     Education Details standing hip ABD addition to HEP, getting scheduled for speech therapy eval    Person(s) Educated Patient    Methods Explanation;Demonstration;Handout    Comprehension Verbalized understanding;Returned demonstration              PT Short Term Goals - 09/26/20 1903       PT SHORT TERM GOAL #1   Title Pt will be independent with HEP for improved strength, balance, transfers, and gait including aquatics as needed.    Time 4    Period Weeks    Status New    Target Date 10/24/20      PT SHORT TERM GOAL #2   Title Pt will decrease 5 x sit to stand to <13 sec for improved balance and functional mobility.    Baseline 09/26/20    Time 4    Period Weeks    Status New    Target Date 10/24/20      PT SHORT TERM GOAL #3   Title Pt will increase FGA from 16/30 to >19/30 for improved balance and gait.    Baseline 09/26/20 16/30    Time 4    Period Weeks    Status New    Target Date 10/24/20               PT Long Term Goals - 09/26/20 1907       PT LONG TERM GOAL #1   Title Pt  will report <4/10 pain in left knee with activities for improved function. (LTGs due  11/21/20)    Baseline 4/10    Time 8    Period Weeks    Status New    Target Date 11/21/20      PT LONG TERM GOAL #2   Title Pt will increase gait speed to >0.57m/s for improved community ambulation.    Baseline 09/26/20 0.35m/s    Time 8    Period Weeks    Status New    Target Date 12/12/20      PT LONG TERM GOAL #3   Title Patient to improve FGA to >/= 24/30 demonstrating improved dynamic balance:    Baseline 09/26/20 16/30    Time 8    Period Weeks    Status New    Target Date 11/21/20      PT LONG TERM GOAL #4   Title Pt will ambulate >1000' with cane versus no AD on varied surfaces mod I for improved community mobility.    Time 8    Period Weeks    Status New    Target Date 11/21/20      PT LONG TERM GOAL #5   Title Lower Extremity Functional Scale goal TBD    Time 8    Period Weeks    Status New    Target Date 11/21/20                   Plan - 10/03/20 1632     Clinical Impression Statement Today's skilled session focused on BLE strengthening (notably L hip) and balance on compliant surfaces. Pt tolerated well with reporting a decr in knee pain at the end of session. Added alternating standing hip ABD to HEP for hip strengthening/SLS. Will continue to progress towards LTGs.    Personal Factors and Comorbidities Comorbidity 3+    Comorbidities DM2, CVA, HTN, osteoporosis, glaucoma, L visual field deficits,right MCA 10/2016 readmitted 02/08/2018 for right parieto-occipital stroke    Examination-Activity Limitations Locomotion Level;Transfers;Stand;Stairs    Examination-Participation Restrictions Community Activity;Shop    Stability/Clinical Decision Making Stable/Uncomplicated    Rehab Potential Good    PT Frequency 2x / week   plus eval   PT Duration 8 weeks    PT Treatment/Interventions ADLs/Self Care Home Management;DME Instruction;Gait training;Functional mobility  training;Therapeutic activities;Therapeutic exercise;Balance training;Neuromuscular re-education;Patient/family education;Aquatic Therapy;Manual techniques;Cryotherapy;Moist Heat;Passive range of motion    PT Next Visit Plan continue with LE strengthening, specifically left hip. balance training being mindful of pain. any update towards pool?    PT Home Exercise Plan Access Code: [email or web address]    Consulted and Agree with Plan of Care Patient             Patient will benefit from skilled therapeutic intervention in order to improve the following deficits and impairments:  Abnormal gait, Difficulty walking, Decreased balance, Decreased mobility, Decreased strength, Pain  Visit Diagnosis: Other abnormalities of gait and mobility  Muscle weakness (generalized)  Unsteadiness on feet  Chronic pain of left knee     Problem List Patient Active Problem List   Diagnosis Date Noted   Coronary artery disease of native artery of native heart with stable angina pectoris ([place]) 03/04/2020   Diabetes mellitus ([place]) 03/03/2020   Aortic atherosclerosis ([place]) 02/12/2020   Bilateral leg cramps 01/21/2020   Anxiety disorder 01/21/2020   Type 2 diabetes mellitus with hyperglycemia, with long-term current use of insulin ([place]) 08/15/2019   Type 2 diabetes mellitus with diabetic polyneuropathy, with long-term current use of insulin ([place]) 08/15/2019   Type 2 diabetes mellitus with microalbuminuria,  with long-term current use of insulin ([place]) 08/15/2019   Hyperlipidemia LDL goal <70 08/15/2019   Bilateral carpal tunnel syndrome 03/27/2019   Osteoarthritis of both knees 11/27/2018   CKD (chronic kidney disease), stage II 11/27/2018   GERD (gastroesophageal reflux disease) 06/26/2018   Homonymous hemianopia, left 02/08/2018   Diabetes mellitus with coincident hypertension ([place])    Trigger finger, right index finger 10/17/2017   Low back pain 07/29/2017   Carotid artery stenosis 11/02/2016   Left arm  weakness    Diastolic dysfunction    Hypertension with heart disease    Acute blood loss anemia    Cerebral infarction due to embolism of right middle cerebral artery ([place])    Perirectal abscess 09/26/2015   Vitamin D deficiency 07/23/2015   Osteopenia 02/16/2013   Ankle fracture 01/19/2013   Anemia 09/27/2011   Type II diabetes mellitus with nephropathy ([place]) 09/24/2011   Mixed hyperlipidemia 09/24/2011    Arliss Journey, PT, DPT  10/03/2020, 4:33 PM  [facility] [address] [place], [place], [address] Phone: [phone]   Fax:  [phone]  Name: Sarah [name] MRN: [id number] Date of Birth: May 02, 1945

## 2020-10-08 ENCOUNTER — Ambulatory Visit: Payer: Medicare Other | Admitting: Physical Therapy

## 2020-10-09 ENCOUNTER — Other Ambulatory Visit: Payer: Self-pay

## 2020-10-09 ENCOUNTER — Ambulatory Visit: Payer: Medicare Other

## 2020-10-09 DIAGNOSIS — G8929 Other chronic pain: Secondary | ICD-10-CM | POA: Diagnosis not present

## 2020-10-09 DIAGNOSIS — R2681 Unsteadiness on feet: Secondary | ICD-10-CM | POA: Diagnosis not present

## 2020-10-09 DIAGNOSIS — M6281 Muscle weakness (generalized): Secondary | ICD-10-CM

## 2020-10-09 DIAGNOSIS — M25562 Pain in left knee: Secondary | ICD-10-CM | POA: Diagnosis not present

## 2020-10-09 DIAGNOSIS — R2689 Other abnormalities of gait and mobility: Secondary | ICD-10-CM | POA: Diagnosis not present

## 2020-10-09 NOTE — Patient Instructions (Signed)
Access Code: [email or web address] URL: [email or web address] Date: 10/09/2020 Prepared by: Cherly Anderson  Exercises Sit to Stand without Arm Support - 1 x daily - 7 x weekly - 10 reps - 2 sets Walking March - 1 x daily - 5 x weekly - 2-3 reps - 1 sets Tandem Walking with Counter Support - 1 x daily - 5 x weekly - 1 sets - 2-3 reps Standing Single Leg Stance with Counter Support - 1 x daily - 5 x weekly - 1 sets - 3 reps - 10 hold Standing Near Stance in Corner with Eyes Closed - 1 x daily - 5 x weekly - 1 sets - 3 reps - 30 sec's hold Standing Balance in Corner with Eyes Closed Head Movements - 1 x daily - 5 x weekly - 1 sets - 10 reps Standing Hip Abduction with Counter Support - 2 x daily - 5 x weekly - 1 sets - 10 reps Supine Bridge - 1 x daily - 5 x weekly - 2 sets - 10 reps Clamshell with Resistance - 1 x daily - 5 x weekly - 2 sets - 10 reps

## 2020-10-10 ENCOUNTER — Ambulatory Visit: Payer: Medicare Other | Admitting: Physical Therapy

## 2020-10-10 NOTE — Therapy (Signed)
[facility] [address] [place], [place], [address] Phone: [phone]   Fax:  [phone]  Physical Therapy Treatment  Patient Details  Name: Sarah [name] MRN: [id number] Date of Birth: November 17, 1945 Referring Provider (PT): Frann Rider, NP   Encounter Date: 10/09/2020   PT End of Session - 10/09/20 1623     Visit Number 4    Number of Visits 17    Date for PT Re-Evaluation 11/21/20    Authorization Type [employer] Medicare - 10th visit PN    PT Start Time 1620    PT Stop Time 1700    PT Time Calculation (min) 40 min    Equipment Utilized During Treatment Gait belt    Activity Tolerance Patient tolerated treatment well    Behavior During Therapy [facility] for tasks assessed/performed             Past Medical History:  Diagnosis Date   Bilateral carpal tunnel syndrome 03/27/2019   Boils    Glaucoma    Heart murmur    HTN (hypertension)    Hx of adenomatous colonic polyps    Hyperlipidemia    Osteoporosis    Pneumonia    Stroke ([place])    Type II or unspecified type diabetes mellitus without mention of complication, uncontrolled     Past Surgical History:  Procedure Laterality Date   ABDOMINAL HYSTERECTOMY     CARPAL TUNNEL RELEASE     right   COLONOSCOPY  12-10-10   per Dr. Deatra Ina, clear, repeat in 7 yrs    CORONARY STENT INTERVENTION N/A 03/11/2020   Procedure: CORONARY STENT INTERVENTION;  Surgeon: Jettie Booze, MD;  Location: [facility];  Service: Cardiovascular;  Laterality: N/A;   INCISION AND DRAINAGE PERIRECTAL ABSCESS N/A 09/26/2015   Procedure: IRRIGATION AND DEBRIDEMENT PERIRECTAL ABSCESS;  Surgeon: Mickeal Skinner, MD;  Location: [facility];  Service: General;  Laterality: N/A;   INTRAVASCULAR ULTRASOUND/IVUS N/A 03/11/2020   Procedure: Intravascular Ultrasound/IVUS;  Surgeon: Jettie Booze, MD;  Location: [facility];  Service: Cardiovascular;  Laterality: N/A;   KNEE  ARTHROSCOPY     right   LEFT HEART CATH AND CORONARY ANGIOGRAPHY N/A 03/11/2020   Procedure: LEFT HEART CATH AND CORONARY ANGIOGRAPHY;  Surgeon: Jettie Booze, MD;  Location: [facility];  Service: Cardiovascular;  Laterality: N/A;   LOOP RECORDER INSERTION N/A 10/19/2016   Procedure: LOOP RECORDER INSERTION;  Surgeon: Thompson Grayer, MD;  Location: [facility];  Service: Cardiovascular;  Laterality: N/A;   ORIF ANKLE FRACTURE Right 03/24/2012   Procedure: OPEN REDUCTION INTERNAL FIXATION (ORIF) ANKLE FRACTURE;  Surgeon: Newt Minion, MD;  Location: [facility];  Service: Orthopedics;  Laterality: Right;  Open Reduction Internal Fixation Right Bimalleolar ankle fracture   POLYPECTOMY     TEE WITHOUT CARDIOVERSION N/A 10/18/2016   Procedure: TRANSESOPHAGEAL ECHOCARDIOGRAM (TEE);  Surgeon: Fay Records, MD;  Location: [facility];  Service: Cardiovascular;  Laterality: N/A;   TONSILLECTOMY      There were no vitals filed for this visit.   Subjective Assessment - 10/09/20 1625     Subjective Pt reports she almost didn't come as her feet were swelling more and more painful. Started more this week. Did note some pitting edema in left ankle.    Patient Stated Goals Pt would like to improve her balance and decrease knee pain. Also wants to improve her memory.    Currently in Pain? Yes  Pain Score 5     Pain Location Ankle    Pain Orientation Right    Pain Descriptors / Indicators Pressure;Tightness    Pain Type Chronic pain    Pain Onset More than a month ago    Pain Frequency Constant                               [facility] Adult PT Treatment/Exercise - 10/09/20 1628       Ambulation/Gait   Ambulation/Gait Yes      Neuro Re-ed    Neuro Re-ed Details  In // bars: standing on airex: with feet together eyes open x 30 sec, feet apart eyes closed x 30 sec with increased sway, eyes open with head turns left/right and up/down x 10 each. Staggered stance 30 sec x  2 each position. Pt performed sit to stand with feet on airex x 10 with verbal cues to lean forward and not shift weight to heels too quickly. On airex marching in place with light fingertip support on therapist with cues to go slow and controlled.      Exercises   Exercises Other Exercises    Other Exercises  Bridges x 10, right sidelying for left clamshell x 10 then x 10 with red theraband with verbal cues for form.                     PT Education - 10/10/20 0853     Education Details Pt was instructed to try to elevate legs when sitting for awhile. She does state that swelling goes down overnight. Explained that weakness from CVA can cause swelling as muscles not helping to pump fluid up as well. Added bridge and clamshell with red theraband resistance for days feet are bothering her more.    Person(s) Educated Patient    Methods Explanation;Demonstration;Handout    Comprehension Verbalized understanding              PT Short Term Goals - 09/26/20 1903       PT SHORT TERM GOAL #1   Title Pt will be independent with HEP for improved strength, balance, transfers, and gait including aquatics as needed.    Time 4    Period Weeks    Status New    Target Date 10/24/20      PT SHORT TERM GOAL #2   Title Pt will decrease 5 x sit to stand to <13 sec for improved balance and functional mobility.    Baseline 09/26/20    Time 4    Period Weeks    Status New    Target Date 10/24/20      PT SHORT TERM GOAL #3   Title Pt will increase FGA from 16/30 to >19/30 for improved balance and gait.    Baseline 09/26/20 16/30    Time 4    Period Weeks    Status New    Target Date 10/24/20               PT Long Term Goals - 09/26/20 1907       PT LONG TERM GOAL #1   Title Pt will report <4/10 pain in left knee with activities for improved function. (LTGs due 11/21/20)    Baseline 4/10    Time 8    Period Weeks    Status New    Target Date 11/21/20      PT LONG  TERM  GOAL #2   Title Pt will increase gait speed to >0.20m/s for improved community ambulation.    Baseline 09/26/20 0.43m/s    Time 8    Period Weeks    Status New    Target Date 12/12/20      PT LONG TERM GOAL #3   Title Patient to improve FGA to >/= 24/30 demonstrating improved dynamic balance:    Baseline 09/26/20 16/30    Time 8    Period Weeks    Status New    Target Date 11/21/20      PT LONG TERM GOAL #4   Title Pt will ambulate >1000' with cane versus no AD on varied surfaces mod I for improved community mobility.    Time 8    Period Weeks    Status New    Target Date 11/21/20      PT LONG TERM GOAL #5   Title Lower Extremity Functional Scale goal TBD    Time 8    Period Weeks    Status New    Target Date 11/21/20                   Plan - 10/10/20 0855     Clinical Impression Statement Pt was having more pain in left ankle/foot today with some swelling noted. Worked more on strengthening on mat and static standing balance on compliant surfaces due to this. Pt had no increase in pain with session.    Personal Factors and Comorbidities Comorbidity 3+    Comorbidities DM2, CVA, HTN, osteoporosis, glaucoma, L visual field deficits,right MCA 10/2016 readmitted 02/08/2018 for right parieto-occipital stroke    Examination-Activity Limitations Locomotion Level;Transfers;Stand;Stairs    Examination-Participation Restrictions Community Activity;Shop    Stability/Clinical Decision Making Stable/Uncomplicated    Rehab Potential Good    PT Frequency 2x / week   plus eval   PT Duration 8 weeks    PT Treatment/Interventions ADLs/Self Care Home Management;DME Instruction;Gait training;Functional mobility training;Therapeutic activities;Therapeutic exercise;Balance training;Neuromuscular re-education;Patient/family education;Aquatic Therapy;Manual techniques;Cryotherapy;Moist Heat;Passive range of motion    PT Next Visit Plan continue with LE strengthening, specifically left hip.  balance training being mindful of pain. PT is sending request to [facility] for aquatic PT as pt could not do appointments on Monday or Tuesday.    PT Home Exercise Plan Access Code: [email or web address]    Consulted and Agree with Plan of Care Patient             Patient will benefit from skilled therapeutic intervention in order to improve the following deficits and impairments:  Abnormal gait, Difficulty walking, Decreased balance, Decreased mobility, Decreased strength, Pain  Visit Diagnosis: Other abnormalities of gait and mobility  Muscle weakness (generalized)  Unsteadiness on feet     Problem List Patient Active Problem List   Diagnosis Date Noted   Coronary artery disease of native artery of native heart with stable angina pectoris ([place]) 03/04/2020   Diabetes mellitus ([place]) 03/03/2020   Aortic atherosclerosis ([facility]) 02/12/2020   Bilateral leg cramps 01/21/2020   Anxiety disorder 01/21/2020   Type 2 diabetes mellitus with hyperglycemia, with long-term current use of insulin ([place]) 08/15/2019   Type 2 diabetes mellitus with diabetic polyneuropathy, with long-term current use of insulin ([place]) 08/15/2019   Type 2 diabetes mellitus with microalbuminuria, with long-term current use of insulin ([place]) 08/15/2019   Hyperlipidemia LDL goal <70 08/15/2019   Bilateral carpal tunnel syndrome 03/27/2019   Osteoarthritis of both knees 11/27/2018   CKD (chronic  kidney disease), stage II 11/27/2018   GERD (gastroesophageal reflux disease) 06/26/2018   Homonymous hemianopia, left 02/08/2018   Diabetes mellitus with coincident hypertension (HCC)    Trigger finger, right index finger 10/17/2017   Low back pain 07/29/2017   Carotid artery stenosis 11/02/2016   Left arm weakness    Diastolic dysfunction    Hypertension with heart disease    Acute blood loss anemia    Cerebral infarction due to embolism of right middle cerebral artery ([facility])    Perirectal abscess 09/26/2015   Vitamin D deficiency  07/23/2015   Osteopenia 02/16/2013   Ankle fracture 01/19/2013   Anemia 09/27/2011   Type II diabetes mellitus with nephropathy ([place]) 09/24/2011   Mixed hyperlipidemia 09/24/2011    Electa Sniff, PT, DPT, NCS 10/10/2020, 8:58 AM  [facility] [address] [place], [place], [address] Phone: [phone]   Fax:  [phone]  Name: TAMEYAH KOCH MRN: [id number] Date of Birth: 01-08-1946

## 2020-10-15 ENCOUNTER — Ambulatory Visit: Payer: Medicare Other

## 2020-10-16 ENCOUNTER — Other Ambulatory Visit: Payer: Self-pay | Admitting: Family Medicine

## 2020-10-16 DIAGNOSIS — F419 Anxiety disorder, unspecified: Secondary | ICD-10-CM

## 2020-10-17 ENCOUNTER — Encounter: Payer: Self-pay | Admitting: Physical Therapy

## 2020-10-17 ENCOUNTER — Ambulatory Visit: Payer: Medicare Other | Attending: Adult Health | Admitting: Physical Therapy

## 2020-10-17 ENCOUNTER — Other Ambulatory Visit: Payer: Self-pay

## 2020-10-17 ENCOUNTER — Ambulatory Visit: Payer: Medicare Other | Admitting: Internal Medicine

## 2020-10-17 DIAGNOSIS — R262 Difficulty in walking, not elsewhere classified: Secondary | ICD-10-CM | POA: Diagnosis not present

## 2020-10-17 DIAGNOSIS — R2689 Other abnormalities of gait and mobility: Secondary | ICD-10-CM | POA: Insufficient documentation

## 2020-10-17 DIAGNOSIS — R2681 Unsteadiness on feet: Secondary | ICD-10-CM | POA: Insufficient documentation

## 2020-10-17 DIAGNOSIS — R293 Abnormal posture: Secondary | ICD-10-CM | POA: Insufficient documentation

## 2020-10-17 DIAGNOSIS — M25561 Pain in right knee: Secondary | ICD-10-CM | POA: Insufficient documentation

## 2020-10-17 DIAGNOSIS — M6281 Muscle weakness (generalized): Secondary | ICD-10-CM | POA: Insufficient documentation

## 2020-10-17 DIAGNOSIS — R41841 Cognitive communication deficit: Secondary | ICD-10-CM | POA: Diagnosis present

## 2020-10-17 DIAGNOSIS — G8929 Other chronic pain: Secondary | ICD-10-CM | POA: Insufficient documentation

## 2020-10-17 NOTE — Patient Instructions (Signed)
  Aquatic Therapy: What to Expect!  Where:  [facility] at [address] [place], [place]  [address] [phone]  NOTE:  You will receive an automated phone message reminding you of your appointment and it will say the appointment is at the Re[facility] on [address]  We are working to fix this- just know that you will meet us at the pool!  How to Prepare: Please make sure you drink 8 ounces of water about one hour prior to your pool session A caregiver MUST attend the entire session with the patient.  The caregiver will be responsible for assisting with dressing as well as any toileting needs.  If the patient will be doing a home program this should likely be the person who will assist as well.  Patients must wear either their street shoes or pool shoes until they are ready to enter the pool with the therapist.  Patients must also wear either street shoes or pool shoes once exiting the pool to walk to the locker room.  This will helps us prevent slips and falls.  Please arrive 15 minutes early to prepare for your pool therapy session Sign in at the front desk on the clipboard marked for [facility] You may use the locker rooms on your right and then enter directly into the recreation pool (NOT the competition pool) Please make sure to attend to any toileting needs prior to entering the pool Please be dressed in your swim suit and on the pool deck at least 5 minutes before your appointment Once on the pool deck your therapist will ask you to sign the Patient  Consent and Assignment of Benefits form Your therapist may take your blood pressure prior to, during and after your session if indicated  About the pool  and parking: Entering the pool Your therapist will assist you; there are 2 ways to enter:  stairs with railings or with a chair lift.   Your therapist will determine the most appropriate way for you. Water temperature is usually between 86-87 degrees There  may be other swimmers in the pool at the same time Parking is free.   Contact Info:     Appointments: [facility]  All sessions are 45 minutes   [address]  [address]     Please call the [facility] if   [place], [place]   [address]    you need to cancel or reschedule an appointment.  [phone]       

## 2020-10-17 NOTE — Progress Notes (Deleted)
Name: Sarah [name]  Age/ Sex: 75 y.o., female   MRN/ DOB: [id number], September 17, 1945     PCP: [name], [name], MD   Reason for Endocrinology Evaluation: Type 2 Diabetes Mellitus  Initial Endocrine Consultative Visit: 08/15/2019    PATIENT IDENTIFIER: [name] is a 75 y.o. female with a past medical history of T2DM, CVA, Dyslipidemia  And CAD (S/P PCI 03/2020). The patient has followed with Endocrinology clinic since 08/15/2019 for consultative assistance with management of her diabetes.  DIABETIC HISTORY:  Ms. Wiegand was diagnosed with DM many years ago. Her hemoglobin A1c has ranged from 10.3% in 2020, peaking at 16.3% in 2016.   On her initial visit to our clinic she had an A1c of 12.5 % She was on MDI regimen but was not taking Metformin due to GI side effects so we stopped it. We adjusted MDI regimen and continued Victoza    Started Farxiga 02/2020    Lives with mother who is 21 yrs ago  SUBJECTIVE:   During the last visit (06/13/2020): A1c 8.9 %  continued victoza and adjusted MDI regimen , increased  farxiga       Today (10/17/2020): [name] is here for a follow up on diabetes management. She checks her blood sugars 2 times weekly . The patient has not had hypoglycemic episodes since the last clinic visit    HOME DIABETES REGIMEN:  Victoza 1.8 mg daily  Lantus  64 units daily  Farxiga 10 mg daily  Humalog  14 units with Breakfast, 14 units with Lunch and 18 units with Supper     Statin: yes ACE-I/ARB: yes     CONTINUOUS GLUCOSE MONITORING RECORD INTERPRETATION    Dates of Recording: 5/21-06/13/2020  Sensor description: Dexcom  Results statistics:   CGM use % of time 100  Average and SD 219/63  Time in range 20 %  % Time Above 180 52  % Time above 250 27  % Time Below target <1     Glycemic patterns summary: Hyperglycemia all day and night, but worse during the day   Hyperglycemic episodes  All day and night   Hypoglycemic  episodes occurred during the day , post bolus   Overnight periods: trends down but remains high      DIABETIC COMPLICATIONS: Microvascular complications:  Retinopathy, neuropathy  Denies: CKD Last Eye Exam: Completed 02/2019  Macrovascular complications:  CVA, CAD (S/P PCI 03/2020 Denies: PVD   HISTORY:  Past Medical History:  Past Medical History:  Diagnosis Date   Bilateral carpal tunnel syndrome 03/27/2019   Boils    Glaucoma    Heart murmur    HTN (hypertension)    Hx of adenomatous colonic polyps    Hyperlipidemia    Osteoporosis    Pneumonia    Stroke ([place])    Type II or unspecified type diabetes mellitus without mention of complication, uncontrolled    Past Surgical History:  Past Surgical History:  Procedure Laterality Date   ABDOMINAL HYSTERECTOMY     CARPAL TUNNEL RELEASE     right   COLONOSCOPY  12-10-10   per Dr. Deatra Ina, clear, repeat in 7 yrs    CORONARY STENT INTERVENTION N/A 03/11/2020   Procedure: CORONARY STENT INTERVENTION;  Surgeon: Jettie Booze, MD;  Location: [facility];  Service: Cardiovascular;  Laterality: N/A;   INCISION AND DRAINAGE PERIRECTAL ABSCESS N/A 09/26/2015   Procedure: IRRIGATION AND DEBRIDEMENT PERIRECTAL ABSCESS;  Surgeon: Mickeal Skinner, MD;  [facility];  Service: General;  Laterality: N/A;   INTRAVASCULAR ULTRASOUND/IVUS N/A 03/11/2020   Procedure: Intravascular Ultrasound/IVUS;  Surgeon: Jettie Booze, MD;  Location: [facility];  Service: Cardiovascular;  Laterality: N/A;   KNEE ARTHROSCOPY     right   LEFT HEART CATH AND CORONARY ANGIOGRAPHY N/A 03/11/2020   Procedure: LEFT HEART CATH AND CORONARY ANGIOGRAPHY;  Surgeon: Jettie Booze, MD;  Location: [facility];  Service: Cardiovascular;  Laterality: N/A;   LOOP RECORDER INSERTION N/A 10/19/2016   Procedure: LOOP RECORDER INSERTION;  Surgeon: Thompson Grayer, MD;  Location: [facility];  Service: Cardiovascular;   Laterality: N/A;   ORIF ANKLE FRACTURE Right 03/24/2012   Procedure: OPEN REDUCTION INTERNAL FIXATION (ORIF) ANKLE FRACTURE;  Surgeon: Newt Minion, MD;  Location: [facility];  Service: Orthopedics;  Laterality: Right;  Open Reduction Internal Fixation Right Bimalleolar ankle fracture   POLYPECTOMY     TEE WITHOUT CARDIOVERSION N/A 10/18/2016   Procedure: TRANSESOPHAGEAL ECHOCARDIOGRAM (TEE);  Surgeon: Fay Records, MD;  Location: [facility];  Service: Cardiovascular;  Laterality: N/A;   TONSILLECTOMY     Social History:  reports that she quit smoking about 4 years ago. Her smoking use included cigarettes. She has never used smokeless tobacco. She reports current alcohol use. She reports that she does not use drugs. Family History:  Family History  Problem Relation Age of Onset   Diabetes Mother    Hypertension Mother    Stroke Father    Stroke Maternal Uncle    Stroke Paternal Uncle    Colon cancer Neg Hx    Esophageal cancer Neg Hx    Rectal cancer Neg Hx    Stomach cancer Neg Hx      HOME MEDICATIONS: Allergies as of 10/17/2020   No Known Allergies      Medication List        Accurate as of October 17, 2020 11:08 AM. If you have any questions, ask your nurse or doctor.          acetaminophen 500 MG tablet Commonly known as: TYLENOL Take 1,000 mg by mouth every 6 (six) hours as needed for mild pain or headache.   amLODipine 10 MG tablet Commonly known as: NORVASC TAKE 1 TABLET(10 MG) BY MOUTH DAILY   aspirin EC 81 MG tablet Swallow whole. TAKE daily until after heart Cath What changed:  how much to take how to take this when to take this additional instructions   atorvastatin 80 MG tablet Commonly known as: LIPITOR TAKE 1 TABLET(80 MG) BY MOUTH DAILY AT 6 PM   calcium carbonate 750 MG chewable tablet Commonly known as: TUMS EX Chew 1 tablet by mouth as needed for heartburn.   clopidogrel 75 MG tablet Commonly known as: PLAVIX Take 1 tablet (75 mg total)  by mouth daily.   dapagliflozin propanediol 10 MG Tabs tablet Commonly known as: Farxiga Take 1 tablet (10 mg total) by mouth daily.   Dexcom G6 Sensor Misc 1 Device by Does not apply route as directed.   Dexcom G6 Transmitter Misc 1 Device by Does not apply route as directed.   diclofenac 75 MG EC tablet Commonly known as: VOLTAREN Take 1 tablet (75 mg total) by mouth 2 (two) times daily as needed.   diclofenac Sodium 1 % Gel Commonly known as: Voltaren Apply 2 g topically 4 (four) times daily.   ezetimibe 10 MG tablet Commonly known as: ZETIA TAKE 1 TABLET(10 MG) BY  MOUTH DAILY   furosemide 20 MG tablet Commonly known as: LASIX TAKE 1 TABLET(20 MG) BY MOUTH DAILY   HumaLOG KwikPen 200 UNIT/ML KwikPen Generic drug: insulin lispro Inject 14 Units into the skin 2 (two) times daily with breakfast and lunch AND 18 Units daily with supper. 15 U 15 min before meals.   Insulin Pen Needle 32G X 4 MM Misc 1 Device by Does not apply route in the morning, at noon, in the evening, and at bedtime.   Lantus SoloStar 100 UNIT/ML Solostar Pen Generic drug: insulin glargine Inject 64 Units into the skin daily.   losartan 50 MG tablet Commonly known as: COZAAR TAKE 1 TABLET(50 MG) BY MOUTH DAILY   metoprolol tartrate 25 MG tablet Commonly known as: LOPRESSOR Take 1 tablet (25 mg total) by mouth 2 (two) times daily.   nitroGLYCERIN 0.4 MG SL tablet Commonly known as: NITROSTAT Place 1 tablet (0.4 mg total) under the tongue every 5 (five) minutes as needed for chest pain. one tab every 5 minutes up to 3 tablets total over 15 minutes.   ONE TOUCH ULTRA TEST test strip Generic drug: glucose blood Check BS before and 2 hours after meals.   OT ULTRA/FASTTK CNTRL SOLN Soln   Pharmacist Choice Alcohol 70 % Misc Generic drug: Isopropyl Alcohol   Rocklatan 0.02-0.005 % Soln Generic drug: Netarsudil-Latanoprost Place 1 drop into both eyes every evening.   sertraline 25 MG  tablet Commonly known as: ZOLOFT TAKE 1 TABLET(25 MG) BY MOUTH DAILY   Simple Diagnostics Lancing Dev Misc Check BS before and 2 hours after meals.   Victoza 18 MG/3ML Sopn Generic drug: liraglutide Inject 1.8 mg into the skin daily.         OBJECTIVE:   Vital Signs: There were no vitals taken for this visit.  Wt Readings from Last 3 Encounters:  09/22/20 139 lb (63 kg)  09/11/20 141 lb (64 kg)  07/31/20 140 lb (63.5 kg)     Exam: General: Pt appears well and is in NAD  Lungs: Clear with good BS bilat with no rales, rhonchi, or wheezes  Heart: RRR   Extremities: No pretibial edema.   Neuro: MS is good with appropriate affect, pt is alert and Ox3   DM foot exam: 8//04/2019   The skin of the feet is intact without sores or ulcerations. The pedal pulses are 1+ on right and 1+ on left. The sensation is decreased  to a screening 5.07, 10 gram monofilament bilaterally    DATA REVIEWED:  Lab Results  Component Value Date   HGBA1C 8.9 (A) 06/13/2020   HGBA1C 14.3 (A) 03/03/2020   HGBA1C 12.5 (H) 08/01/2019   Results for VALENA, IVANOV (MRN [id number]) as of 06/13/2020 15:46  Ref. Range 03/04/2020 21:30  BASIC METABOLIC PANEL Unknown Rpt (A)  Sodium Latest Ref Range: 134 - 144 mmol/L 137  Potassium Latest Ref Range: 3.5 - 5.2 mmol/L 4.2  Chloride Latest Ref Range: 96 - 106 mmol/L 102  CO2 Latest Ref Range: 20 - 29 mmol/L 21  Glucose Latest Ref Range: 65 - 99 mg/dL 375 (H)  BUN Latest Ref Range: 8 - 27 mg/dL 16  Creatinine Latest Ref Range: 0.57 - 1.00 mg/dL 0.65  Calcium Latest Ref Range: 8.7 - 10.3 mg/dL 9.7  BUN/Creatinine Ratio Latest Ref Range: 12 - 28  25    ASSESSMENT / PLAN / RECOMMENDATIONS:   1) Type 2 Diabetes Mellitus, Poorly controlled, With neuropathic and  macrovascular  complications -  Most recent A1c of 8.9 %. Goal A1c < 7.0 %.    - A1c dow from 14.3% I have encouraged her to continue with glucose check and medication compliance  - She is  liking Dexcom  - Will make the following changes   MEDICATIONS: - Increase Farxiga to 10 mg, 1 tablet daily  - Continue Victoza 1.8 mg daily  - Increase Lantus to 64 units daily  - Change  Humalog to 14 units with Breakfast, 14 units with Lunch and 18 units with Supper    EDUCATION / INSTRUCTIONS: BG monitoring instructions: Patient is instructed to check her blood sugars 3 times a day, before meals  Call [facility] if: BG persistently < 70 I reviewed the Rule of 15 for the treatment of hypoglycemia in detail with the patient. Literature supplied.   2) Diabetic complications:  Eye: there's a questionable diabetic retinopathy from her history Neuro/ Feet: Does have known diabetic peripheral neuropathy .  Renal: Patient does not have known baseline CKD. She   is  on an ACEI/ARB at present.       F/U in 4 months    Signed electronically by: Mack Guise, MD  [facility]  [facility] [address] [place], [place] [address] Phone: [phone] FAX: [phone]   CC: [name], [name], [place] [address] Phone: [phone]  Fax: [phone]  Return to Endocrinology clinic as below: Future Appointments  Date Time Provider [place]  10/17/2020 12:30 PM Arliss Journey, PT [facility]-NR [facility]  10/17/2020  3:20 PM Claudean Leavelle, Melanie Crazier, MD LBPC-LBENDO None  10/22/2020 11:45 AM Drema Balzarine, PTA [facility]NR [facility]  10/22/2020 12:30 PM Alinda Deem, CCC-SLP OPRC-NR OPRCNR  10/23/2020  3:30 PM Electa Sniff, PT [facility]NR [facility]  10/29/2020  9:30 AM Lovvorn, Hewitt Shorts, CCC-SLP [facility]-NR [facility]  10/29/2020 10:15 AM Arliss Journey, PT [facility]-NR [facility]  10/30/2020  2:00 PM GI-BCG DX DEXA 1 GI-BCGDG GI-BREAST CE  10/31/2020 11:00 AM Arliss Journey, PT [facility]-NR [facility]  11/05/2020 11:45 AM Electa Sniff, PT [facility]-NR [facility]  11/07/2020 11:00 AM Electa Sniff, PT O[facility]-NR  [facility]  11/11/2020 11:10 AM Mansouraty, Telford Nab., MD LBGI-GI LBPCGastro  11/12/2020 12:30 PM Alinda Deem, CCC-SLP O[facility]-NR [facility]  11/12/2020  1:15 PM Electa Sniff, PT [facility]NR [facility]  11/14/2020 12:30 PM Electa Sniff, PT [facility]NR [facility]  11/14/2020  1:15 PM Alinda Deem, CCC-SLP OPRC-NR OPRCNR  11/17/2020  2:00 PM [name], [name], MD LBPC-BF PEC  11/19/2020 11:45 AM Electa Sniff, PT O[facility]-NR [facility]  11/19/2020 12:30 PM Alinda Deem, CCC-SLP OPRC-NR OPRCNR  11/21/2020 11:00 AM Alinda Deem, CCC-SLP O[facility]C-NR [facility]  11/21/2020 12:30 PM Electa Sniff, PT [facility]-NR [facility]  12/26/2020  7:30 AM [facility]  03/23/2021  9:40 AM Gasper Sells, Terisa Starr, MD [facility]

## 2020-10-17 NOTE — Therapy (Signed)
[facility] [address] [place], [place], [address] Phone: [phone]   Fax:  [phone]  Physical Therapy Treatment  Patient Details  Name: Sarah [name] MRN: [id number] Date of Birth: 04-Mar-1945 Referring Provider (PT): Frann Rider, NP   Encounter Date: 10/17/2020   PT End of Session - 10/17/20 1314     Visit Number 5    Number of Visits 17    Date for PT Re-Evaluation 11/21/20    Authorization Type [employer] Medicare - 10th visit PN    PT Start Time 1232    PT Stop Time 1311    PT Time Calculation (min) 39 min    Equipment Utilized During Treatment Gait belt    Activity Tolerance Patient tolerated treatment well    Behavior During Therapy [facility] for tasks assessed/performed             Past Medical History:  Diagnosis Date   Bilateral carpal tunnel syndrome 03/27/2019   Boils    Glaucoma    Heart murmur    HTN (hypertension)    Hx of adenomatous colonic polyps    Hyperlipidemia    Osteoporosis    Pneumonia    Stroke ([place])    Type II or unspecified type diabetes mellitus without mention of complication, uncontrolled     Past Surgical History:  Procedure Laterality Date   ABDOMINAL HYSTERECTOMY     CARPAL TUNNEL RELEASE     right   COLONOSCOPY  12-10-10   per Dr. Deatra Ina, clear, repeat in 7 yrs    CORONARY STENT INTERVENTION N/A 03/11/2020   Procedure: CORONARY STENT INTERVENTION;  Surgeon: Jettie Booze, MD;  Location: [facility];  Service: Cardiovascular;  Laterality: N/A;   INCISION AND DRAINAGE PERIRECTAL ABSCESS N/A 09/26/2015   Procedure: IRRIGATION AND DEBRIDEMENT PERIRECTAL ABSCESS;  Surgeon: Mickeal Skinner, MD;  Location: [facility];  Service: General;  Laterality: N/A;   INTRAVASCULAR ULTRASOUND/IVUS N/A 03/11/2020   Procedure: Intravascular Ultrasound/IVUS;  Surgeon: Jettie Booze, MD;  Location: [facility];  Service: Cardiovascular;  Laterality: N/A;   KNEE  ARTHROSCOPY     right   LEFT HEART CATH AND CORONARY ANGIOGRAPHY N/A 03/11/2020   Procedure: LEFT HEART CATH AND CORONARY ANGIOGRAPHY;  Surgeon: Jettie Booze, MD;  Location: [facility];  Service: Cardiovascular;  Laterality: N/A;   LOOP RECORDER INSERTION N/A 10/19/2016   Procedure: LOOP RECORDER INSERTION;  Surgeon: Thompson Grayer, MD;  Location: [facility];  Service: Cardiovascular;  Laterality: N/A;   ORIF ANKLE FRACTURE Right 03/24/2012   Procedure: OPEN REDUCTION INTERNAL FIXATION (ORIF) ANKLE FRACTURE;  Surgeon: Newt Minion, MD;  Location: [facility];  Service: Orthopedics;  Laterality: Right;  Open Reduction Internal Fixation Right Bimalleolar ankle fracture   POLYPECTOMY     TEE WITHOUT CARDIOVERSION N/A 10/18/2016   Procedure: TRANSESOPHAGEAL ECHOCARDIOGRAM (TEE);  Surgeon: Fay Records, MD;  Location: [facility];  Service: Cardiovascular;  Laterality: N/A;   TONSILLECTOMY      There were no vitals filed for this visit.   Subjective Assessment - 10/17/20 1234     Subjective Had to miss her appt earlier this week because she had some kind of bug. Has been trying to elevate her feet more at home and reports the swelling has been better.    Patient Stated Goals Pt would like to improve her balance and decrease knee pain. Also wants to improve her memory.  Currently in Pain? No/denies    Pain Onset More than a month ago                               [facility] Adult PT Treatment/Exercise - 10/17/20 0001       Therapeutic Activites    Therapeutic Activities Other Therapeutic Activities    Other Therapeutic Activities Provided information for aquatic therapy and for pt to call [facility] location for pt to get scheduled 1x a week with Tharon Aquas, PT as pt can only do pool appts later in the week                 Balance Exercises - 10/17/20 1244       Balance Exercises: Standing   Standing Eyes Closed Foam/compliant surface;Wide (BOA);3  reps;30 secs;Limitations    Standing Eyes Closed Limitations on air ex, feet hip width    Rockerboard Anterior/posterior;Lateral;Limitations    Rockerboard Limitations A/P weight shifting x12 reps, head turns 2 x 10 reps, head nods 2 x 10 reps - with taps to bars as needed.  Lateral weight shifting x12 reps with cues for slowed and controlled. Reaching with RUE to L side and grabbing bean bag and tossing into crate x15 reps, intermittent taps to bars for balance    Marching Foam/compliant surface;Intermittent upper extremity assist;Limitations    Marching Limitations on air ex, x12 reps bilat, cues for slowed and controlled, intermittent taps to bars for balance    Sit to Stand Foam/compliant surface;Limitations    Sit to Stand Limitations x10 reps without hands on blue air ex, cues for forward weight shift                PT Education - 10/17/20 1314     Education Details Information for pool therapy and getting scheduled.    Person(s) Educated Patient    Methods Explanation;Handout    Comprehension Verbalized understanding              PT Short Term Goals - 09/26/20 1903       PT SHORT TERM GOAL #1   Title Pt will be independent with HEP for improved strength, balance, transfers, and gait including aquatics as needed.    Time 4    Period Weeks    Status New    Target Date 10/24/20      PT SHORT TERM GOAL #2   Title Pt will decrease 5 x sit to stand to <13 sec for improved balance and functional mobility.    Baseline 09/26/20    Time 4    Period Weeks    Status New    Target Date 10/24/20      PT SHORT TERM GOAL #3   Title Pt will increase FGA from 16/30 to >19/30 for improved balance and gait.    Baseline 09/26/20 16/30    Time 4    Period Weeks    Status New    Target Date 10/24/20               PT Long Term Goals - 09/26/20 1907       PT LONG TERM GOAL #1   Title Pt will report <4/10 pain in left knee with activities for improved function. (LTGs due  11/21/20)    Baseline 4/10    Time 8    Period Weeks    Status New    Target Date 11/21/20  PT LONG TERM GOAL #2   Title Pt will increase gait speed to >0.61m/s for improved community ambulation.    Baseline 09/26/20 0.65m/s    Time 8    Period Weeks    Status New    Target Date 12/12/20      PT LONG TERM GOAL #3   Title Patient to improve FGA to >/= 24/30 demonstrating improved dynamic balance:    Baseline 09/26/20 16/30    Time 8    Period Weeks    Status New    Target Date 11/21/20      PT LONG TERM GOAL #4   Title Pt will ambulate >1000' with cane versus no AD on varied surfaces mod I for improved community mobility.    Time 8    Period Weeks    Status New    Target Date 11/21/20      PT LONG TERM GOAL #5   Title Lower Extremity Functional Scale goal TBD    Time 8    Period Weeks    Status New    Target Date 11/21/20                   Plan - 10/17/20 1315     Clinical Impression Statement Pt with no reports of pain throughout session today. Focused on balance strategies on compliant surfaces such as air ex and rockerboard. Pt challenged by lateral weight shifting on the rockerboard - needing to grab out for bars at times for balance. Will continue to progress towrards LTGs.    Personal Factors and Comorbidities Comorbidity 3+    Comorbidities DM2, CVA, HTN, osteoporosis, glaucoma, L visual field deficits,right MCA 10/2016 readmitted 02/08/2018 for right parieto-occipital stroke    Examination-Activity Limitations Locomotion Level;Transfers;Stand;Stairs    Examination-Participation Restrictions Community Activity;Shop    Stability/Clinical Decision Making Stable/Uncomplicated    Rehab Potential Good    PT Frequency 2x / week   plus eval   PT Duration 8 weeks    PT Treatment/Interventions ADLs/Self Care Home Management;DME Instruction;Gait training;Functional mobility training;Therapeutic activities;Therapeutic exercise;Balance training;Neuromuscular  re-education;Patient/family education;Aquatic Therapy;Manual techniques;Cryotherapy;Moist Heat;Passive range of motion    PT Next Visit Plan STGs due this week. continue with LE strengthening, specifically left hip. balance training being mindful of pain. did pt get scheduled for aquatics?    PT Home Exercise Plan Access Code: [email or web address]    Consulted and Agree with Plan of Care Patient             Patient will benefit from skilled therapeutic intervention in order to improve the following deficits and impairments:  Abnormal gait, Difficulty walking, Decreased balance, Decreased mobility, Decreased strength, Pain  Visit Diagnosis: Other abnormalities of gait and mobility  Muscle weakness (generalized)  Unsteadiness on feet     Problem List Patient Active Problem List   Diagnosis Date Noted   Coronary artery disease of native artery of native heart with stable angina pectoris ([place]) 03/04/2020   Diabetes mellitus ([place]) 03/03/2020   Aortic atherosclerosis ([place]) 02/12/2020   Bilateral leg cramps 01/21/2020   Anxiety disorder 01/21/2020   Type 2 diabetes mellitus with hyperglycemia, with long-term current use of insulin ([place]) 08/15/2019   Type 2 diabetes mellitus with diabetic polyneuropathy, with long-term current use of insulin ([place]) 08/15/2019   Type 2 diabetes mellitus with microalbuminuria, with long-term current use of insulin ([place]) 08/15/2019   Hyperlipidemia LDL goal <70 08/15/2019   Bilateral carpal tunnel syndrome 03/27/2019   Osteoarthritis of both knees 11/27/2018  CKD (chronic kidney disease), stage II 11/27/2018   GERD (gastroesophageal reflux disease) 06/26/2018   Homonymous hemianopia, left 02/08/2018   Diabetes mellitus with coincident hypertension ([place])    Trigger finger, right index finger 10/17/2017   Low back pain 07/29/2017   Carotid artery stenosis 11/02/2016   Left arm weakness    Diastolic dysfunction    Hypertension with heart disease    Acute blood  loss anemia    Cerebral infarction due to embolism of right middle cerebral artery ([place])    Perirectal abscess 09/26/2015   Vitamin D deficiency 07/23/2015   Osteopenia 02/16/2013   Ankle fracture 01/19/2013   Anemia 09/27/2011   Type II diabetes mellitus with nephropathy ([place]) 09/24/2011   Mixed hyperlipidemia 09/24/2011    Arliss Journey, PT, DPT  10/17/2020, 1:16 PM  [facility] [address] [place], [place], [address] Phone: [phone]   Fax:  [phone]  Name: Sarah [name] MRN: [id number] Date of Birth: August 10, 1945

## 2020-10-22 ENCOUNTER — Ambulatory Visit: Payer: Medicare Other | Admitting: Physical Therapy

## 2020-10-22 ENCOUNTER — Ambulatory Visit: Payer: Medicare Other

## 2020-10-23 ENCOUNTER — Other Ambulatory Visit: Payer: Self-pay

## 2020-10-23 ENCOUNTER — Ambulatory Visit: Payer: Medicare Other

## 2020-10-23 DIAGNOSIS — R2681 Unsteadiness on feet: Secondary | ICD-10-CM

## 2020-10-23 DIAGNOSIS — M6281 Muscle weakness (generalized): Secondary | ICD-10-CM

## 2020-10-23 DIAGNOSIS — R2689 Other abnormalities of gait and mobility: Secondary | ICD-10-CM

## 2020-10-23 DIAGNOSIS — R262 Difficulty in walking, not elsewhere classified: Secondary | ICD-10-CM | POA: Diagnosis not present

## 2020-10-23 DIAGNOSIS — M25561 Pain in right knee: Secondary | ICD-10-CM | POA: Diagnosis not present

## 2020-10-23 DIAGNOSIS — G8929 Other chronic pain: Secondary | ICD-10-CM | POA: Diagnosis not present

## 2020-10-23 DIAGNOSIS — R293 Abnormal posture: Secondary | ICD-10-CM | POA: Diagnosis not present

## 2020-10-23 NOTE — Therapy (Signed)
[facility] [address] [place], [place], [address] Phone: [phone]   Fax:  [phone]  Physical Therapy Treatment  Patient Details  Name: Sarah [name] MRN: [id number] Date of Birth: [birth year] Referring Provider (PT): Frann Rider, NP   Encounter Date: 10/23/2020   PT End of Session - 10/23/20 1533     Visit Number 6    Number of Visits 17    Date for PT Re-Evaluation 11/21/20    Authorization Type [employer] Medicare - 10th visit PN    PT Start Time 0737    PT Stop Time 1612    PT Time Calculation (min) 42 min    Equipment Utilized During Treatment Gait belt    Activity Tolerance Patient tolerated treatment well    Behavior During Therapy [facility] for tasks assessed/performed             Past Medical History:  Diagnosis Date   Bilateral carpal tunnel syndrome 03/27/2019   Boils    Glaucoma    Heart murmur    HTN (hypertension)    Hx of adenomatous colonic polyps    Hyperlipidemia    Osteoporosis    Pneumonia    Stroke ([place])    Type II or unspecified type diabetes mellitus without mention of complication, uncontrolled     Past Surgical History:  Procedure Laterality Date   ABDOMINAL HYSTERECTOMY     CARPAL TUNNEL RELEASE     right   COLONOSCOPY  12-10-10   per Dr. Deatra Ina, clear, repeat in 7 yrs    CORONARY STENT INTERVENTION N/A 03/11/2020   Procedure: CORONARY STENT INTERVENTION;  Surgeon: Jettie Booze, MD;  Location: [facility];  Service: Cardiovascular;  Laterality: N/A;   INCISION AND DRAINAGE PERIRECTAL ABSCESS N/A 09/26/2015   Procedure: IRRIGATION AND DEBRIDEMENT PERIRECTAL ABSCESS;  Surgeon: Mickeal Skinner, MD;  Location: [facility];  Service: General;  Laterality: N/A;   INTRAVASCULAR ULTRASOUND/IVUS N/A 03/11/2020   Procedure: Intravascular Ultrasound/IVUS;  Surgeon: Jettie Booze, MD;  Location: [facility];  Service: Cardiovascular;  Laterality: N/A;    KNEE ARTHROSCOPY     right   LEFT HEART CATH AND CORONARY ANGIOGRAPHY N/A 03/11/2020   Procedure: LEFT HEART CATH AND CORONARY ANGIOGRAPHY;  Surgeon: Jettie Booze, MD;  Location: [facility];  Service: Cardiovascular;  Laterality: N/A;   LOOP RECORDER INSERTION N/A 10/19/2016   Procedure: LOOP RECORDER INSERTION;  Surgeon: Thompson Grayer, MD;  Location: [facility];  Service: Cardiovascular;  Laterality: N/A;   ORIF ANKLE FRACTURE Right 03/24/2012   Procedure: OPEN REDUCTION INTERNAL FIXATION (ORIF) ANKLE FRACTURE;  Surgeon: Newt Minion, MD;  Location: [facility];  Service: Orthopedics;  Laterality: Right;  Open Reduction Internal Fixation Right Bimalleolar ankle fracture   POLYPECTOMY     TEE WITHOUT CARDIOVERSION N/A 10/18/2016   Procedure: TRANSESOPHAGEAL ECHOCARDIOGRAM (TEE);  Surgeon: Fay Records, MD;  Location: [facility];  Service: Cardiovascular;  Laterality: N/A;   TONSILLECTOMY      There were no vitals filed for this visit.   Subjective Assessment - 10/23/20 1542     Subjective Pt reports that she would like to get in pool but had trouble scheduling. PT called scheduler to get her scheduled. Pt reports feet are doing some better. Swelling a little less.    Patient Stated Goals Pt would like to improve her balance and decrease knee pain. Also wants to improve her memory.  Currently in Pain? Yes    Pain Score 5     Pain Location Knee    Pain Orientation Left    Pain Descriptors / Indicators Sharp;Aching    Pain Type Chronic pain    Pain Onset More than a month ago                [facility] PT Assessment - 10/23/20 1545       Functional Gait  Assessment   Gait assessed  Yes    Gait Level Surface Walks 20 ft, slow speed, abnormal gait pattern, evidence for imbalance or deviates 10-15 in outside of the 12 in walkway width. Requires more than 7 sec to ambulate 20 ft.    Change in Gait Speed Able to smoothly change walking speed without loss of balance or  gait deviation. Deviate no more than 6 in outside of the 12 in walkway width.    Gait with Horizontal Head Turns Performs head turns smoothly with no change in gait. Deviates no more than 6 in outside 12 in walkway width    Gait with Vertical Head Turns Performs head turns with no change in gait. Deviates no more than 6 in outside 12 in walkway width.    Gait and Pivot Turn Pivot turns safely within 3 sec and stops quickly with no loss of balance.    Step Over Obstacle Is able to step over one shoe box (4.5 in total height) but must slow down and adjust steps to clear box safely. May require verbal cueing.    Gait with Narrow Base of Support Ambulates less than 4 steps heel to toe or cannot perform without assistance.    Gait with Eyes Closed Walks 20 ft, uses assistive device, slower speed, mild gait deviations, deviates 6-10 in outside 12 in walkway width. Ambulates 20 ft in less than 9 sec but greater than 7 sec.    Ambulating Backwards Walks 20 ft, slow speed, abnormal gait pattern, evidence for imbalance, deviates 10-15 in outside 12 in walkway width.    Steps Alternating feet, must use rail.    Total Score 19                           [facility] Adult PT Treatment/Exercise - 10/23/20 1545       Transfers   Five time sit to stand comments  13.23 sec from chair without hands      Ambulation/Gait   Ambulation/Gait Yes    Ambulation/Gait Assistance 5: Supervision    Ambulation/Gait Assistance Details around in clinic during session    Assistive device None    Gait Pattern Step-through pattern    Ambulation Surface Level;Indoor      Neuro Re-ed    Neuro Re-ed Details  In // bars on rockerboard positioned lateral: maintaining level x 30 sec eyes open with some preference to right, eyes closed with multiple LOB more to left with min assist, eyes oepn with head turns left/right and up/down with more preference to right again CGA/min assist at times.      Exercises   Exercises  Other Exercises    Other Exercises  Clamshells with LLE 10 x 2 with red theraband, bridge x 10 with verbal cues for form.                     PT Education - 10/23/20 2114     Education Details results of testing  Person(s) Educated Patient    Methods Explanation    Comprehension Verbalized understanding              PT Short Term Goals - 10/23/20 1553       PT SHORT TERM GOAL #1   Title Pt will be independent with HEP for improved strength, balance, transfers, and gait including aquatics as needed.    Baseline 10/23/20 Pt understands current HEP. Is starting aquatic therapy next week.    Time 4    Period Weeks    Status Partially Met    Target Date 10/24/20      PT SHORT TERM GOAL #2   Title Pt will decrease 5 x sit to stand to <13 sec for improved balance and functional mobility.    Baseline 09/26/20 15.87 sec, 10/23/20 13.23 sec    Time 4    Period Weeks    Status Partially Met    Target Date 10/24/20      PT SHORT TERM GOAL #3   Title Pt will increase FGA from 16/30 to >19/30 for improved balance and gait.    Baseline 09/26/20 16/30. 10/23/20 19/30    Time 4    Period Weeks    Status Partially Met    Target Date 10/24/20               PT Long Term Goals - 09/26/20 1907       PT LONG TERM GOAL #1   Title Pt will report <4/10 pain in left knee with activities for improved function. (LTGs due 11/21/20)    Baseline 4/10    Time 8    Period Weeks    Status New    Target Date 11/21/20      PT LONG TERM GOAL #2   Title Pt will increase gait speed to >0.49ms for improved community ambulation.    Baseline 09/26/20 0.770m    Time 8    Period Weeks    Status New    Target Date 12/12/20      PT LONG TERM GOAL #3   Title Patient to improve FGA to >/= 24/30 demonstrating improved dynamic balance:    Baseline 09/26/20 16/30    Time 8    Period Weeks    Status New    Target Date 11/21/20      PT LONG TERM GOAL #4   Title Pt will ambulate  >1000' with cane versus no AD on varied surfaces mod I for improved community mobility.    Time 8    Period Weeks    Status New    Target Date 11/21/20      PT LONG TERM GOAL #5   Title Lower Extremity Functional Scale goal TBD    Time 8    Period Weeks    Status New    Target Date 11/21/20                   Plan - 10/23/20 2114     Clinical Impression Statement PT assessed STGs today. Pt partially met 3/3 goals. Has not started aquatic PT yet which is why HEP goal partially met. Decreased time on 5 x sit to stand just short of goal. FGA score improved to 19/30 which was just short of goal. Pt will in fall risk category based on scores. Pt will continue to benefit from skilled PT to further improve balance, strength and functional mobility.    Personal Factors and Comorbidities Comorbidity 3+  Comorbidities DM2, CVA, HTN, osteoporosis, glaucoma, L visual field deficits,right MCA 10/2016 readmitted 02/08/2018 for right parieto-occipital stroke    Examination-Activity Limitations Locomotion Level;Transfers;Stand;Stairs    Examination-Participation Restrictions Community Activity;Shop    Stability/Clinical Decision Making Stable/Uncomplicated    Rehab Potential Good    PT Frequency 2x / week   plus eval   PT Duration 8 weeks    PT Treatment/Interventions ADLs/Self Care Home Management;DME Instruction;Gait training;Functional mobility training;Therapeutic activities;Therapeutic exercise;Balance training;Neuromuscular re-education;Patient/family education;Aquatic Therapy;Manual techniques;Cryotherapy;Moist Heat;Passive range of motion    PT Next Visit Plan continue with LE strengthening, specifically left hip. balance training being mindful of pain. Work on standing on compliant surfaces.    PT Home Exercise Plan Access Code: [email or web address]    Consulted and Agree with Plan of Care Patient             Patient will benefit from skilled therapeutic intervention in order to improve  the following deficits and impairments:  Abnormal gait, Difficulty walking, Decreased balance, Decreased mobility, Decreased strength, Pain  Visit Diagnosis: Other abnormalities of gait and mobility  Muscle weakness (generalized)  Unsteadiness on feet     Problem List Patient Active Problem List   Diagnosis Date Noted   Coronary artery disease of native artery of native heart with stable angina pectoris ([place]) 03/04/2020   Diabetes mellitus ([place]) 03/03/2020   Aortic atherosclerosis ([place]) 02/12/2020   Bilateral leg cramps 01/21/2020   Anxiety disorder 01/21/2020   Type 2 diabetes mellitus with hyperglycemia, with long-term current use of insulin ([place]) 08/15/2019   Type 2 diabetes mellitus with diabetic polyneuropathy, with long-term current use of insulin ([place]) 08/15/2019   Type 2 diabetes mellitus with microalbuminuria, with long-term current use of insulin ([place]) 08/15/2019   Hyperlipidemia LDL goal <70 08/15/2019   Bilateral carpal tunnel syndrome 03/27/2019   Osteoarthritis of both knees 11/27/2018   CKD (chronic kidney disease), stage II 11/27/2018   GERD (gastroesophageal reflux disease) 06/26/2018   Homonymous hemianopia, left 02/08/2018   Diabetes mellitus with coincident hypertension (HCC)    Trigger finger, right index finger 10/17/2017   Low back pain 07/29/2017   Carotid artery stenosis 11/02/2016   Left arm weakness    Diastolic dysfunction    Hypertension with heart disease    Acute blood loss anemia    Cerebral infarction due to embolism of right middle cerebral artery ([place])    Perirectal abscess 09/26/2015   Vitamin D deficiency 07/23/2015   Osteopenia 02/16/2013   Ankle fracture 01/19/2013   Anemia 09/27/2011   Type II diabetes mellitus with nephropathy ([place]) 09/24/2011   Mixed hyperlipidemia 09/24/2011    Electa Sniff, PT, DPT, NCS 10/23/2020, 9:17 PM  [facility] [address] [place], [place], [address] Phone: [phone]   Fax:  [phone]  Name: Sarah [name] MRN: [id number] Date of Birth: August 12, 1945

## 2020-10-28 ENCOUNTER — Other Ambulatory Visit: Payer: Self-pay | Admitting: Family Medicine

## 2020-10-28 DIAGNOSIS — E2839 Other primary ovarian failure: Secondary | ICD-10-CM

## 2020-10-29 ENCOUNTER — Ambulatory Visit: Payer: Medicare Other | Admitting: Speech Pathology

## 2020-10-29 ENCOUNTER — Ambulatory Visit (HOSPITAL_BASED_OUTPATIENT_CLINIC_OR_DEPARTMENT_OTHER): Payer: Medicare Other | Attending: Adult Health | Admitting: Physical Therapy

## 2020-10-29 ENCOUNTER — Encounter: Payer: Self-pay | Admitting: Speech Pathology

## 2020-10-29 ENCOUNTER — Other Ambulatory Visit: Payer: Self-pay

## 2020-10-29 ENCOUNTER — Ambulatory Visit: Payer: Medicare Other | Admitting: Physical Therapy

## 2020-10-29 DIAGNOSIS — R262 Difficulty in walking, not elsewhere classified: Secondary | ICD-10-CM | POA: Diagnosis not present

## 2020-10-29 DIAGNOSIS — R2689 Other abnormalities of gait and mobility: Secondary | ICD-10-CM | POA: Diagnosis not present

## 2020-10-29 DIAGNOSIS — R293 Abnormal posture: Secondary | ICD-10-CM | POA: Insufficient documentation

## 2020-10-29 DIAGNOSIS — G8929 Other chronic pain: Secondary | ICD-10-CM | POA: Diagnosis not present

## 2020-10-29 DIAGNOSIS — M25561 Pain in right knee: Secondary | ICD-10-CM | POA: Insufficient documentation

## 2020-10-29 DIAGNOSIS — R2681 Unsteadiness on feet: Secondary | ICD-10-CM | POA: Diagnosis not present

## 2020-10-29 DIAGNOSIS — M6281 Muscle weakness (generalized): Secondary | ICD-10-CM | POA: Diagnosis not present

## 2020-10-29 DIAGNOSIS — R41841 Cognitive communication deficit: Secondary | ICD-10-CM

## 2020-10-29 NOTE — Therapy (Signed)
[facility] [address] [place], [place], [address] Phone: [phone]   Fax:  [phone]  Speech Language Pathology Evaluation  Patient Details  Name: Sarah [name] MRN: [id number] Date of Birth: [birth year] Referring Provider (SLP): Frann Rider, NP   Encounter Date: 10/29/2020   End of Session - 10/29/20 1033     Visit Number 1    Number of Visits 25    Date for SLP Re-Evaluation 01/23/21    SLP Start Time 0930    SLP Stop Time  50    SLP Time Calculation (min) 50 min             Past Medical History:  Diagnosis Date   Bilateral carpal tunnel syndrome 03/27/2019   Boils    Glaucoma    Heart murmur    HTN (hypertension)    Hx of adenomatous colonic polyps    Hyperlipidemia    Osteoporosis    Pneumonia    Stroke ([place])    Type II or unspecified type diabetes mellitus without mention of complication, uncontrolled     Past Surgical History:  Procedure Laterality Date   ABDOMINAL HYSTERECTOMY     CARPAL TUNNEL RELEASE     right   COLONOSCOPY  12-10-10   per Dr. Deatra Ina, clear, repeat in 7 yrs    CORONARY STENT INTERVENTION N/A 03/11/2020   Procedure: CORONARY STENT INTERVENTION;  Surgeon: Jettie Booze, MD;  Location: [facility];  Service: Cardiovascular;  Laterality: N/A;   INCISION AND DRAINAGE PERIRECTAL ABSCESS N/A 09/26/2015   Procedure: IRRIGATION AND DEBRIDEMENT PERIRECTAL ABSCESS;  Surgeon: Mickeal Skinner, MD;  Location: [facility];  Service: General;  Laterality: N/A;   INTRAVASCULAR ULTRASOUND/IVUS N/A 03/11/2020   Procedure: Intravascular Ultrasound/IVUS;  Surgeon: Jettie Booze, MD;  Location: [facility];  Service: Cardiovascular;  Laterality: N/A;   KNEE ARTHROSCOPY     right   LEFT HEART CATH AND CORONARY ANGIOGRAPHY N/A 03/11/2020   Procedure: LEFT HEART CATH AND CORONARY ANGIOGRAPHY;  Surgeon: Jettie Booze, MD;  Location: [facility];   Service: Cardiovascular;  Laterality: N/A;   LOOP RECORDER INSERTION N/A 10/19/2016   Procedure: LOOP RECORDER INSERTION;  Surgeon: Thompson Grayer, MD;  Location: [facility];  Service: Cardiovascular;  Laterality: N/A;   ORIF ANKLE FRACTURE Right 03/24/2012   Procedure: OPEN REDUCTION INTERNAL FIXATION (ORIF) ANKLE FRACTURE;  Surgeon: Newt Minion, MD;  Location: [facility];  Service: Orthopedics;  Laterality: Right;  Open Reduction Internal Fixation Right Bimalleolar ankle fracture   POLYPECTOMY     TEE WITHOUT CARDIOVERSION N/A 10/18/2016   Procedure: TRANSESOPHAGEAL ECHOCARDIOGRAM (TEE);  Surgeon: Fay Records, MD;  Location: [facility];  Service: Cardiovascular;  Laterality: N/A;   TONSILLECTOMY      There were no vitals filed for this visit.   Subjective Assessment - 10/29/20 0938     Subjective "I think it's memory"    Currently in Pain? Yes    Pain Score 4     Pain Location Teeth    Pain Orientation Right    Pain Descriptors / Indicators Aching    Pain Type Chronic pain                SLP Evaluation [facility] - 10/29/20 6283       SLP Visit Information   SLP Received On 10/29/20    Referring Provider (SLP) Frann Rider, NP    Onset Date 6  months    Medical Diagnosis Memory Loss, late CVA      Subjective   Patient/Family Stated Goal Get better at remembering things      Pain Assessment   Pain Onset More than a month ago    Pain Frequency --   [place] - has dental appointment     General Information   HPI Sarah [name] is know to [place] from 2 previous evaluations for verbal apraxia s/p 2 CVA's 2018, 2019. She attended 1 therapy session after 1st evaluation, and did not return after 2nd evaluation. Today she is concerned about her memory as she has noticed  changes in the past 6 months Today, speech is WNL, mild verbal apraxia has resolved    Mobility Status walks independently - on PT caseload      Prior Functional Status   Cognitive/Linguistic Baseline Within  functional limits    Type of Home House     Lives With --   caretaker for her mother   Available Support Family    Vocation Retired      Associate Professor   Overall Cognitive Status Impaired/Different from baseline    Area of Impairment Memory;Attention    Attention Alternating;Divided    Alternating Attention Impaired    Alternating Attention Impairment Verbal complex;Functional complex    Divided Attention Impaired    Divided Attention Impairment Verbal basic;Functional basic    Memory Impaired    Memory Impairment Storage deficit;Decreased recall of new information    Awareness Appears intact    Executive Function --   intact     Auditory Comprehension   Overall Auditory Comprehension Appears within functional limits for tasks assessed      Verbal Expression   Overall Verbal Expression Appears within functional limits for tasks assessed      Written Expression   Dominant Hand Right    Written Expression Not tested      Oral Motor/Sensory Function   Labial ROM Within Functional Limits      Motor Speech   Overall Motor Speech Appears within functional limits for tasks assessed      Standardized Assessments   Standardized Assessments  Cognitive Linguistic Quick Test      Cognitive Linguistic Quick Test (Ages 18-69)   Attention WNL    Memory WNL    Executive Function WNL    Language WNL    Visuospatial Skills WNL    Severity Rating Total 20    Composite Severity Rating 16.8                             SLP Education - 10/29/20 1032     Education Details goals, results of CLQT    Person(s) Educated Patient    Methods Explanation;Verbal cues    Comprehension Verbal cues required;Need further instruction              SLP Short Term Goals - 10/29/20 1044       SLP SHORT TERM GOAL #1   Title Pt will carryover compensatory strategies to report misplacing items, such as DM monitor, 3 or less times a week    Time 6    Period Weeks    Status New       SLP SHORT TERM GOAL #2   Title Pt will carryover 2 environmental strategies to support attention and word recall in 15 minute conversations with mod I    Time 6    Period Weeks  Status New      SLP SHORT TERM GOAL #3   Title Pt will use 2 strategies to recall directions when driving with mod I, including GPS as tolerated    Time 6    Period Weeks    Status New              SLP Long Term Goals - 10/29/20 1047       SLP LONG TERM GOAL #1   Title Pt will carryover compensatory strategies for word finding over 20 minute conversation with rare min A    Time 12    Period Weeks    Status New      SLP LONG TERM GOAL #2   Title Pt will carryover 2 strategies to recall conversations and information from her healthcare providers with rare min A    Time 12    Period Weeks    Status New      SLP LONG TERM GOAL #3   Title Pt will imrpve 3 areas rated to occur often or sometimes on the Memory Mistakes Questionnaire    Time [place] - 10/29/20 1034     Clinical Impression Statement Sarah [name] is referred by neurology due to self reported memory concerns. Sarah [name] has noticed changes in her memory in the last 6 months. She frequently looses items such as her portable DM monitor and other household items. She sets something down and then can't find it. She does keep her keys in a designated place. Sarah [name] also reports loosing her train of thought in a conversation and some word fidning difficulty which occurs 3-4x a week. Today, I was asking her a question and she was distracted by talking outside of my closed door and answered the person in the hall instead of me, indicating some attention issues. Also, Sarah [name] will forget how to get to a place she is familiar with and has been several times before. She is not usign GPS on her phone. The Cognitive Linguistic Quick Test revealed WNL cognition for age, however memory was low normal, which  is likely a change for [facility]. The Memory Mistakes Questionnaire revealed her greatest difficulties were coming up with a specific word she wants, remembering a phone number she just looked up, and forgetting to buy and item she intended to buy. She rated some difficulty remembering details from an article she read, forgetting what she wanted to say in a conversation, forgettng a birthday she used to know well, misplacing items, and leaving something behind she meant to bring. Specifically she leaves her DM monitor in the car when she gets home or misplaces it at home. Sarah [name] denies forgetting to take medications and is using a calendar to successfully recall apointments. She is managing finances successfully and not forgetting to pay any bills. Today, she presents with mild memory and attention impairments.I recommend skilled ST to train Sarah [name] in compensatory strategies for cognition, implement supports for cognition for safety, independence, DM management and QOL.    Speech Therapy Frequency 2x / week    Duration 12 weeks    Treatment/Interventions Patient/family education;Internal/external aids;Functional tasks;Cueing hierarchy;Compensatory techniques;Environmental controls;Cognitive reorganization;Multimodal communcation approach;Compensatory strategies;Language facilitation             Patient will benefit from skilled therapeutic intervention in order to improve the following deficits and impairments:   Cognitive communication deficit  Problem List Patient Active Problem List   Diagnosis Date Noted   Coronary artery disease of native artery of native heart with stable angina pectoris ([place]) 03/04/2020   Diabetes mellitus ([place]) 03/03/2020   Aortic atherosclerosis ([place]) 02/12/2020   Bilateral leg cramps 01/21/2020   Anxiety disorder 01/21/2020   Type 2 diabetes mellitus with hyperglycemia, with long-term current use of insulin ([place]) 08/15/2019   Type 2 diabetes mellitus with diabetic  polyneuropathy, with long-term current use of insulin ([place]) 08/15/2019   Type 2 diabetes mellitus with microalbuminuria, with long-term current use of insulin ([place]) 08/15/2019   Hyperlipidemia LDL goal <70 08/15/2019   Bilateral carpal tunnel syndrome 03/27/2019   Osteoarthritis of both knees 11/27/2018   CKD (chronic kidney disease), stage II 11/27/2018   GERD (gastroesophageal reflux disease) 06/26/2018   Homonymous hemianopia, left 02/08/2018   Diabetes mellitus with coincident hypertension (HCC)    Trigger finger, right index finger 10/17/2017   Low back pain 07/29/2017   Carotid artery stenosis 11/02/2016   Left arm weakness    Diastolic dysfunction    Hypertension with heart disease    Acute blood loss anemia    Cerebral infarction due to embolism of right middle cerebral artery ([place])    Perirectal abscess 09/26/2015   Vitamin D deficiency 07/23/2015   Osteopenia 02/16/2013   Ankle fracture 01/19/2013   Anemia 09/27/2011   Type II diabetes mellitus with nephropathy ([place]) 09/24/2011   Mixed hyperlipidemia 09/24/2011    Sarah [name], Sarah Rusk MS, CCC-SLP 10/29/2020, 10:49 AM  [facility] [address] [place], [place], [address] Phone: [phone]   Fax:  [phone]  Name: SHELITHA MAGLEY MRN: [id number] Date of Birth: September 16, 1945

## 2020-10-30 ENCOUNTER — Ambulatory Visit
Admission: RE | Admit: 2020-10-30 | Discharge: 2020-10-30 | Disposition: A | Discharge status: Home / Self Care | Payer: Medicare Other | Source: Ambulatory Visit | Attending: Family Medicine | Admitting: Family Medicine

## 2020-10-30 DIAGNOSIS — E2839 Other primary ovarian failure: Secondary | ICD-10-CM

## 2020-10-30 DIAGNOSIS — M810 Age-related osteoporosis without current pathological fracture: Secondary | ICD-10-CM | POA: Diagnosis not present

## 2020-10-31 ENCOUNTER — Ambulatory Visit: Payer: Medicare Other | Admitting: Physical Therapy

## 2020-11-05 ENCOUNTER — Encounter (HOSPITAL_BASED_OUTPATIENT_CLINIC_OR_DEPARTMENT_OTHER): Payer: Self-pay | Admitting: Physical Therapy

## 2020-11-05 ENCOUNTER — Ambulatory Visit (HOSPITAL_BASED_OUTPATIENT_CLINIC_OR_DEPARTMENT_OTHER): Payer: Medicare Other | Admitting: Physical Therapy

## 2020-11-05 ENCOUNTER — Ambulatory Visit: Payer: Medicare Other

## 2020-11-05 ENCOUNTER — Other Ambulatory Visit: Payer: Self-pay

## 2020-11-05 DIAGNOSIS — M25561 Pain in right knee: Secondary | ICD-10-CM | POA: Diagnosis not present

## 2020-11-05 DIAGNOSIS — R293 Abnormal posture: Secondary | ICD-10-CM

## 2020-11-05 DIAGNOSIS — G8929 Other chronic pain: Secondary | ICD-10-CM

## 2020-11-05 DIAGNOSIS — R2681 Unsteadiness on feet: Secondary | ICD-10-CM

## 2020-11-05 DIAGNOSIS — M6281 Muscle weakness (generalized): Secondary | ICD-10-CM | POA: Diagnosis not present

## 2020-11-05 DIAGNOSIS — R2689 Other abnormalities of gait and mobility: Secondary | ICD-10-CM

## 2020-11-05 DIAGNOSIS — R262 Difficulty in walking, not elsewhere classified: Secondary | ICD-10-CM

## 2020-11-05 NOTE — Therapy (Signed)
[facility] [address] [place], [place], [address] Phone: [phone]   Fax:  [phone]  Physical Therapy Treatment  Patient Details  Name: Sarah [name] MRN: [id number] Date of Birth: [birth year] Referring Provider (PT): Frann Rider, NP   Encounter Date: 11/05/2020   PT End of Session - 11/05/20 1554     Visit Number 7    Number of Visits 17    Date for PT Re-Evaluation 11/21/20    Authorization Type U[employer] Medicare - 10th visit PN    PT Start Time 1548    PT Stop Time 1630    PT Time Calculation (min) 42 min    Equipment Utilized During Treatment Gait belt    Activity Tolerance Patient tolerated treatment well    Behavior During Therapy [facility] for tasks assessed/performed             Past Medical History:  Diagnosis Date   Bilateral carpal tunnel syndrome 03/27/2019   Boils    Glaucoma    Heart murmur    HTN (hypertension)    Hx of adenomatous colonic polyps    Hyperlipidemia    Osteoporosis    Pneumonia    Stroke ([place])    Type II or unspecified type diabetes mellitus without mention of complication, uncontrolled     Past Surgical History:  Procedure Laterality Date   ABDOMINAL HYSTERECTOMY     CARPAL TUNNEL RELEASE     right   COLONOSCOPY  12-10-10   per Dr. Deatra Ina, clear, repeat in 7 yrs    CORONARY STENT INTERVENTION N/A 03/11/2020   Procedure: CORONARY STENT INTERVENTION;  Surgeon: Jettie Booze, MD;  Location: [facility];  Service: Cardiovascular;  Laterality: N/A;   INCISION AND DRAINAGE PERIRECTAL ABSCESS N/A 09/26/2015   Procedure: IRRIGATION AND DEBRIDEMENT PERIRECTAL ABSCESS;  Surgeon: Mickeal Skinner, MD;  Location: [place];  Service: General;  Laterality: N/A;   INTRAVASCULAR ULTRASOUND/IVUS N/A 03/11/2020   Procedure: Intravascular Ultrasound/IVUS;  Surgeon: Jettie Booze, MD;  Location: [facility];  Service: Cardiovascular;  Laterality: N/A;   KNEE  ARTHROSCOPY     right   LEFT HEART CATH AND CORONARY ANGIOGRAPHY N/A 03/11/2020   Procedure: LEFT HEART CATH AND CORONARY ANGIOGRAPHY;  Surgeon: Jettie Booze, MD;  Location: [facility];  Service: Cardiovascular;  Laterality: N/A;   LOOP RECORDER INSERTION N/A 10/19/2016   Procedure: LOOP RECORDER INSERTION;  Surgeon: Thompson Grayer, MD;  Location: [facility];  Service: Cardiovascular;  Laterality: N/A;   ORIF ANKLE FRACTURE Right 03/24/2012   Procedure: OPEN REDUCTION INTERNAL FIXATION (ORIF) ANKLE FRACTURE;  Surgeon: Newt Minion, MD;  Location: [place];  Service: Orthopedics;  Laterality: Right;  Open Reduction Internal Fixation Right Bimalleolar ankle fracture   POLYPECTOMY     TEE WITHOUT CARDIOVERSION N/A 10/18/2016   Procedure: TRANSESOPHAGEAL ECHOCARDIOGRAM (TEE);  Surgeon: Fay Records, MD;  Location: [facility];  Service: Cardiovascular;  Laterality: N/A;   TONSILLECTOMY      There were no vitals filed for this visit.   Subjective Assessment - 11/05/20 1743     Subjective "Pt anxious to get into pool has been waiting a long time."                                        PT Education - 11/05/20 3832  Education Details properties of water; benefits of aquatic therapy.              PT Short Term Goals - 10/23/20 1553       PT SHORT TERM GOAL #1   Title Pt will be independent with HEP for improved strength, balance, transfers, and gait including aquatics as needed.    Baseline 10/23/20 Pt understands current HEP. Is starting aquatic therapy next week.    Time 4    Period Weeks    Status Partially Met    Target Date 10/24/20      PT SHORT TERM GOAL #2   Title Pt will decrease 5 x sit to stand to <13 sec for improved balance and functional mobility.    Baseline 09/26/20 15.87 sec, 10/23/20 13.23 sec    Time 4    Period Weeks    Status Partially Met    Target Date 10/24/20      PT SHORT TERM GOAL #3   Title Pt  will increase FGA from 16/30 to >19/30 for improved balance and gait.    Baseline 09/26/20 16/30. 10/23/20 19/30    Time 4    Period Weeks    Status Partially Met    Target Date 10/24/20               PT Long Term Goals - 09/26/20 1907       PT LONG TERM GOAL #1   Title Pt will report <4/10 pain in left knee with activities for improved function. (LTGs due 11/21/20)    Baseline 4/10    Time 8    Period Weeks    Status New    Target Date 11/21/20      PT LONG TERM GOAL #2   Title Pt will increase gait speed to >0.39ms for improved community ambulation.    Baseline 09/26/20 0.781m    Time 8    Period Weeks    Status New    Target Date 12/12/20      PT LONG TERM GOAL #3   Title Patient to improve FGA to >/= 24/30 demonstrating improved dynamic balance:    Baseline 09/26/20 16/30    Time 8    Period Weeks    Status New    Target Date 11/21/20      PT LONG TERM GOAL #4   Title Pt will ambulate >1000' with cane versus no AD on varied surfaces mod I for improved community mobility.    Time 8    Period Weeks    Status New    Target Date 11/21/20      PT LONG TERM GOAL #5   Title Lower Extremity Functional Scale goal TBD    Time 8    Period Weeks    Status New    Target Date 11/21/20            Pt seen for aquatic therapy today.  Treatment took place in water 3.25-4.8 ft [facility] Temp of water was 91.  Pt entered/exited the pool via stairs step to pattern independently with bilat rail.    Pt water for aquatic therapy for first time and was introduced to principles and therapeutic effects of water as she ambulated and acclimated to pool. .    Pt ambulates forward across pool , then backwards to engage posterior chain and then sidesteping supported bilaterally by 2 foam hand buoys and cga. Pt directed from 3 ft into  35f.    Seated Stretch: Gastroc, hamstring and adductors 3 x 20 sec hold. -flutter kicking at hip 2 x 10  sitting on 2nd to top water step -cycling 2 x 20  Standing Supported bilat on wall: Hip flex 2x10; extension 2x10;  add/abd 2x10; marching 2x10  Pt requiring 3-4 recovery periods seated.    Warm down amb HHA 2 widths (pt posterior leaning, unable to execute amb well) then x 2 holding hand buoys.  Pt requires buoyancy for support and to offload joints with strengthening exercises. Viscosity of the water is needed for resistance of strengthening; water current perturbations provides challenge to standing balance unsupported, requiring increased core activation.             Plan - 11/05/20 1607     Clinical Impression Statement Pt introduced to aquatic setting. She is hesitant and requires hha using hand buoys with all water walking in all depths and directions.  She is directed through varying stretches and strengthenng exercises in sitting and standing requiring verbal and tactile  cues as well as demonstartion.  She completes all exercises without complaints of discomfort.  She will continue to need hands on assistance in the pool for safety as well as execution of activitys.    Stability/Clinical Decision Making Stable/Uncomplicated    Clinical Decision Making Moderate    Rehab Potential Good    PT Frequency 2x / week    PT Treatment/Interventions ADLs/Self Care Home Management;DME Instruction;Gait training;Functional mobility training;Therapeutic activities;Therapeutic exercise;Balance training;Neuromuscular re-education;Patient/family education;Aquatic Therapy;Manual techniques;Cryotherapy;Moist Heat;Passive range of motion    PT Next Visit Plan continue with LE strengthening, specifically left hip. balance training being mindful of pain. Work on standing on compliant surfaces.    PT Home Exercise Plan Access Code: [email or web address]            Patient will benefit from skilled therapeutic intervention in order to improve the following deficits and impairments:  Abnormal gait,  Difficulty walking, Decreased balance, Decreased mobility, Decreased strength, Pain  Visit Diagnosis: Abnormal posture  Other abnormalities of gait and mobility  Unsteadiness on feet  Chronic pain of right knee  Difficulty in walking, not elsewhere classified  Muscle weakness (generalized)     Problem List Patient Active Problem List   Diagnosis Date Noted   Coronary artery disease of native artery of native heart with stable angina pectoris ([place] 03/04/2020   Diabetes mellitus ([place] 03/03/2020   Aortic atherosclerosis ([place] 02/12/2020   Bilateral leg cramps 01/21/2020   Anxiety disorder 01/21/2020   Type 2 diabetes mellitus with hyperglycemia, with long-term current use of insulin ([facility] 08/15/2019   Type 2 diabetes mellitus with diabetic polyneuropathy, with long-term current use of insulin ([facility] 08/15/2019   Type 2 diabetes mellitus with microalbuminuria, with long-term current use of insulin ([place] 08/15/2019   Hyperlipidemia LDL goal <70 08/15/2019   Bilateral carpal tunnel syndrome 03/27/2019   Osteoarthritis of both knees 11/27/2018   CKD (chronic kidney disease), stage II 11/27/2018   GERD (gastroesophageal reflux disease) 06/26/2018   Homonymous hemianopia, left 02/08/2018   Diabetes mellitus with coincident hypertension ([facility]    Trigger finger, right index finger 10/17/2017   Low back pain 07/29/2017   Carotid artery stenosis 11/02/2016   Left arm weakness    Diastolic dysfunction    Hypertension with heart disease    Acute blood loss anemia    Cerebral infarction due to embolism of right middle cerebral artery ([place]    Perirectal abscess 09/26/2015   Vitamin  D deficiency 07/23/2015   Osteopenia 02/16/2013   Ankle fracture 01/19/2013   Anemia 09/27/2011   Type II diabetes mellitus with nephropathy ([place]) 09/24/2011   Mixed hyperlipidemia 09/24/2011    Annamarie Major) Ajayla Iglesias MPT 11/05/2020, 5:52 PM  [facility] [address] [place], [place], [address] Phone: [phone]   Fax:  [phone]  Name: Sarah [name] MRN: [id number] Date of Birth: 09-26-1945

## 2020-11-07 ENCOUNTER — Ambulatory Visit: Payer: Medicare Other

## 2020-11-11 ENCOUNTER — Ambulatory Visit: Payer: Medicare Other | Admitting: Gastroenterology

## 2020-11-12 ENCOUNTER — Ambulatory Visit: Payer: Medicare Other

## 2020-11-12 ENCOUNTER — Encounter: Payer: Self-pay | Admitting: Speech Pathology

## 2020-11-12 ENCOUNTER — Ambulatory Visit (HOSPITAL_BASED_OUTPATIENT_CLINIC_OR_DEPARTMENT_OTHER): Payer: Medicare Other | Attending: Adult Health | Admitting: Physical Therapy

## 2020-11-12 ENCOUNTER — Ambulatory Visit: Payer: Medicare Other | Admitting: Speech Pathology

## 2020-11-12 ENCOUNTER — Other Ambulatory Visit: Payer: Self-pay

## 2020-11-12 ENCOUNTER — Encounter (HOSPITAL_BASED_OUTPATIENT_CLINIC_OR_DEPARTMENT_OTHER): Payer: Self-pay | Admitting: Physical Therapy

## 2020-11-12 DIAGNOSIS — G8929 Other chronic pain: Secondary | ICD-10-CM | POA: Insufficient documentation

## 2020-11-12 DIAGNOSIS — R2689 Other abnormalities of gait and mobility: Secondary | ICD-10-CM | POA: Insufficient documentation

## 2020-11-12 DIAGNOSIS — M25561 Pain in right knee: Secondary | ICD-10-CM | POA: Diagnosis not present

## 2020-11-12 DIAGNOSIS — R2681 Unsteadiness on feet: Secondary | ICD-10-CM | POA: Insufficient documentation

## 2020-11-12 DIAGNOSIS — M25562 Pain in left knee: Secondary | ICD-10-CM | POA: Insufficient documentation

## 2020-11-12 DIAGNOSIS — M6281 Muscle weakness (generalized): Secondary | ICD-10-CM | POA: Insufficient documentation

## 2020-11-12 DIAGNOSIS — R41841 Cognitive communication deficit: Secondary | ICD-10-CM | POA: Insufficient documentation

## 2020-11-12 DIAGNOSIS — R262 Difficulty in walking, not elsewhere classified: Secondary | ICD-10-CM | POA: Diagnosis not present

## 2020-11-12 DIAGNOSIS — R293 Abnormal posture: Secondary | ICD-10-CM | POA: Diagnosis not present

## 2020-11-12 NOTE — Therapy (Signed)
[facility] [address] [place], [place], [address] Phone: [phone]   Fax:  [phone]  Physical Therapy Treatment  Patient Details  Name: Sarah [name] MRN: [id number] Date of Birth: 11-Apr-1945 Referring Provider (PT): Frann Rider, NP   Encounter Date: 11/12/2020   PT End of Session - 11/12/20 1552     Visit Number 8    Number of Visits 17    Date for PT Re-Evaluation 11/21/20    Authorization Type UHC Medicare - 10th visit PN    PT Start Time 1546    PT Stop Time 1629    PT Time Calculation (min) 43 min    Equipment Utilized During Treatment Gait belt    Activity Tolerance Patient tolerated treatment well    Behavior During Therapy [facility] for tasks assessed/performed             Past Medical History:  Diagnosis Date   Bilateral carpal tunnel syndrome 03/27/2019   Boils    Glaucoma    Heart murmur    HTN (hypertension)    Hx of adenomatous colonic polyps    Hyperlipidemia    Osteoporosis    Pneumonia    Stroke ([place])    Type II or unspecified type diabetes mellitus without mention of complication, uncontrolled     Past Surgical History:  Procedure Laterality Date   ABDOMINAL HYSTERECTOMY     CARPAL TUNNEL RELEASE     right   COLONOSCOPY  12-10-10   per Dr. Deatra Ina, clear, repeat in 7 yrs    CORONARY STENT INTERVENTION N/A 03/11/2020   Procedure: CORONARY STENT INTERVENTION;  Surgeon: Jettie Booze, MD;  Location: [facility];  Service: Cardiovascular;  Laterality: N/A;   INCISION AND DRAINAGE PERIRECTAL ABSCESS N/A 09/26/2015   Procedure: IRRIGATION AND DEBRIDEMENT PERIRECTAL ABSCESS;  Surgeon: Mickeal Skinner, MD;  Location: [facility];  Service: General;  Laterality: N/A;   INTRAVASCULAR ULTRASOUND/IVUS N/A 03/11/2020   Procedure: Intravascular Ultrasound/IVUS;  Surgeon: Jettie Booze, MD;  Location: [facility];  Service: Cardiovascular;  Laterality: N/A;   KNEE  ARTHROSCOPY     right   LEFT HEART CATH AND CORONARY ANGIOGRAPHY N/A 03/11/2020   Procedure: LEFT HEART CATH AND CORONARY ANGIOGRAPHY;  Surgeon: Jettie Booze, MD;  Location: [facility];  Service: Cardiovascular;  Laterality: N/A;   LOOP RECORDER INSERTION N/A 10/19/2016   Procedure: LOOP RECORDER INSERTION;  Surgeon: Thompson Grayer, MD;  Location: [facility];  Service: Cardiovascular;  Laterality: N/A;   ORIF ANKLE FRACTURE Right 03/24/2012   Procedure: OPEN REDUCTION INTERNAL FIXATION (ORIF) ANKLE FRACTURE;  Surgeon: Newt Minion, MD;  Location: [facility];  Service: Orthopedics;  Laterality: Right;  Open Reduction Internal Fixation Right Bimalleolar ankle fracture   POLYPECTOMY     TEE WITHOUT CARDIOVERSION N/A 10/18/2016   Procedure: TRANSESOPHAGEAL ECHOCARDIOGRAM (TEE);  Surgeon: Fay Records, MD;  Location: [place] [facility];  Service: Cardiovascular;  Laterality: N/A;   TONSILLECTOMY      There were no vitals filed for this visit.   Subjective Assessment - 11/12/20 1553     Subjective No c/o pain after last session.  "my feet are really swollen.  Haven't really had much pain since I started the pool"    Currently in Pain? No/denies  PT Short Term Goals - 10/23/20 1553       PT SHORT TERM GOAL #1   Title Pt will be independent with HEP for improved strength, balance, transfers, and gait including aquatics as needed.    Baseline 10/23/20 Pt understands current HEP. Is starting aquatic therapy next week.    Time 4    Period Weeks    Status Partially Met    Target Date 10/24/20      PT SHORT TERM GOAL #2   Title Pt will decrease 5 x sit to stand to <13 sec for improved balance and functional mobility.    Baseline 09/26/20 15.87 sec, 10/23/20 13.23 sec    Time 4    Period Weeks    Status Partially Met    Target Date 10/24/20      PT SHORT TERM GOAL #3   Title Pt will increase FGA from 16/30 to  >19/30 for improved balance and gait.    Baseline 09/26/20 16/30. 10/23/20 19/30    Time 4    Period Weeks    Status Partially Met    Target Date 10/24/20               PT Long Term Goals - 09/26/20 1907       PT LONG TERM GOAL #1   Title Pt will report <4/10 pain in left knee with activities for improved function. (LTGs due 11/21/20)    Baseline 4/10    Time 8    Period Weeks    Status New    Target Date 11/21/20      PT LONG TERM GOAL #2   Title Pt will increase gait speed to >0.66ms for improved community ambulation.    Baseline 09/26/20 0.732m    Time 8    Period Weeks    Status New    Target Date 12/12/20      PT LONG TERM GOAL #3   Title Patient to improve FGA to >/= 24/30 demonstrating improved dynamic balance:    Baseline 09/26/20 16/30    Time 8    Period Weeks    Status New    Target Date 11/21/20      PT LONG TERM GOAL #4   Title Pt will ambulate >1000' with cane versus no AD on varied surfaces mod I for improved community mobility.    Time 8    Period Weeks    Status New    Target Date 11/21/20      PT LONG TERM GOAL #5   Title Lower Extremity Functional Scale goal TBD    Time 8    Period Weeks    Status New    Target Date 11/21/20           Pt seen for aquatic therapy today.  Treatment took place in water 3.25-4.8 ft [facility] Temp of water was 91.  Pt entered/exited the pool via stairs step to pattern independently with bilat rail. Warm up water walking 2 widths ea forward with yellow noodle with supervision, back and side stepping hha  Seated Stretch: Gastroc, hamstring and adductors 3 x 20 sec hold. -flutter kicking at hip 3  x 10 sitting on 2nd to top water step -add/abd 2x10 -squats x2x10 on bottom water step hha.  Vc for weight through heels to target quads and glut   Standing Supported bilat on wall: Hip flex x10; extension x10;  add/abd x10; marching x10, full hip extension  through end range  flex x 10   Pt requiring 3-4 recovery periods seated.     Warm down amb 2 widths supported by yellow noodle   Pt requires buoyancy for support and to offload joints with strengthening exercises. Viscosity of the water is needed for resistance of strengthening; water current perturbations provides challenge to standing balance unsupported, requiring increased core activation.         Plan - 11/12/20 1801     Clinical Impression Statement Some improved confidence in pool although requiring hha for improved gait pattern encouraging an increase in cadence and step length. +2 pitted edema bilat ankles and feet.  She does not believe she has eaten food high in sodium nut will talk to MD in regards. She tolerates session without pain. She feels her overall pain has decreased since aqutic therapy has begun. Focus is on strengtheing of LE and core.    Stability/Clinical Decision Making Stable/Uncomplicated    Clinical Decision Making Moderate    Rehab Potential Good    PT Frequency 2x / week    PT Duration 8 weeks    PT Treatment/Interventions ADLs/Self Care Home Management;DME Instruction;Gait training;Functional mobility training;Therapeutic activities;Therapeutic exercise;Balance training;Neuromuscular re-education;Patient/family education;Aquatic Therapy;Manual techniques;Cryotherapy;Moist Heat;Passive range of motion    PT Next Visit Plan continue with LE strengthening, specifically left hip. balance training being mindful of pain. Work on standing on compliant surfaces.    PT Home Exercise Plan Access Code: [email or web address]             Patient will benefit from skilled therapeutic intervention in order to improve the following deficits and impairments:  Abnormal gait, Difficulty walking, Decreased balance, Decreased mobility, Decreased strength, Pain  Visit Diagnosis: Abnormal posture  Unsteadiness on feet  Other abnormalities of gait and mobility  Chronic pain of right  knee  Difficulty in walking, not elsewhere classified  Muscle weakness (generalized)     Problem List Patient Active Problem List   Diagnosis Date Noted   Coronary artery disease of native artery of native heart with stable angina pectoris ([place]) 03/04/2020   Diabetes mellitus ([place]) 03/03/2020   Aortic atherosclerosis ([place]) 02/12/2020   Bilateral leg cramps 01/21/2020   Anxiety disorder 01/21/2020   Type 2 diabetes mellitus with hyperglycemia, with long-term current use of insulin ([place]) 08/15/2019   Type 2 diabetes mellitus with diabetic polyneuropathy, with long-term current use of insulin ([place]) 08/15/2019   Type 2 diabetes mellitus with microalbuminuria, with long-term current use of insulin ([place]) 08/15/2019   Hyperlipidemia LDL goal <70 08/15/2019   Bilateral carpal tunnel syndrome 03/27/2019   Osteoarthritis of both knees 11/27/2018   CKD (chronic kidney disease), stage II 11/27/2018   GERD (gastroesophageal reflux disease) 06/26/2018   Homonymous hemianopia, left 02/08/2018   Diabetes mellitus with coincident hypertension ([place])    Trigger finger, right index finger 10/17/2017   Low back pain 07/29/2017   Carotid artery stenosis 11/02/2016   Left arm weakness    Diastolic dysfunction    Hypertension with heart disease    Acute blood loss anemia    Cerebral infarction due to embolism of right middle cerebral artery ([place])    Perirectal abscess 09/26/2015   Vitamin D deficiency 07/23/2015   Osteopenia 02/16/2013   Ankle fracture 01/19/2013   Anemia 09/27/2011   Type II diabetes mellitus with nephropathy ([place]) 09/24/2011   Mixed hyperlipidemia 09/24/2011    Annamarie Major) Nickisha Hum MPT 11/12/2020, 6:07 PM  [facility] [place],  [place], [address] Phone: [phone]   Fax:  [phone]  Name: Sarah [name] MRN: [id number] Date of Birth: 1945-09-10

## 2020-11-12 NOTE — Therapy (Signed)
[facility] [address] [place], [place], [address] Phone: [phone]   Fax:  [phone]  Speech Language Pathology Treatment  Patient Details  Name: Sarah [name] MRN: [id number] Date of Birth: [birth year] Referring Provider (SLP): Sarah Rider, NP   Encounter Date: 11/12/2020   End of Session - 11/12/20 1235     Visit Number 2    Number of Visits 25    Date for SLP Re-Evaluation 01/23/21    SLP Start Time 70    SLP Stop Time  1310    SLP Time Calculation (min) 40 min    Activity Tolerance Patient tolerated treatment well             Past Medical History:  Diagnosis Date   Bilateral carpal tunnel syndrome 03/27/2019   Boils    Glaucoma    Heart murmur    HTN (hypertension)    Hx of adenomatous colonic polyps    Hyperlipidemia    Osteoporosis    Pneumonia    Stroke ([place])    Type II or unspecified type diabetes mellitus without mention of complication, uncontrolled     Past Surgical History:  Procedure Laterality Date   ABDOMINAL HYSTERECTOMY     CARPAL TUNNEL RELEASE     right   COLONOSCOPY  12-10-10   per Dr. Deatra [name], clear, repeat in 7 yrs    CORONARY STENT INTERVENTION N/A 03/11/2020   Procedure: CORONARY STENT INTERVENTION;  Surgeon: Sarah Booze, MD;  Location: [facility];  Service: Cardiovascular;  Laterality: N/A;   INCISION AND DRAINAGE PERIRECTAL ABSCESS N/A 09/26/2015   Procedure: IRRIGATION AND DEBRIDEMENT PERIRECTAL ABSCESS;  Surgeon: Sarah Skinner, MD;  Location: [facility];  Service: General;  Laterality: N/A;   INTRAVASCULAR ULTRASOUND/IVUS N/A 03/11/2020   Procedure: Intravascular Ultrasound/IVUS;  Surgeon: Sarah Booze, MD;  Location: [facility];  Service: Cardiovascular;  Laterality: N/A;   KNEE ARTHROSCOPY     right   LEFT HEART CATH AND CORONARY ANGIOGRAPHY N/A 03/11/2020   Procedure: LEFT HEART CATH AND CORONARY ANGIOGRAPHY;  Surgeon:  Sarah Booze, MD;  Location: [facility];  Service: Cardiovascular;  Laterality: N/A;   LOOP RECORDER INSERTION N/A 10/19/2016   Procedure: LOOP RECORDER INSERTION;  Surgeon: Sarah Grayer, MD;  Location: [facility];  Service: Cardiovascular;  Laterality: N/A;   ORIF ANKLE FRACTURE Right 03/24/2012   Procedure: OPEN REDUCTION INTERNAL FIXATION (ORIF) ANKLE FRACTURE;  Surgeon: Sarah Minion, MD;  Location: [facility];  Service: Orthopedics;  Laterality: Right;  Open Reduction Internal Fixation Right Bimalleolar ankle fracture   POLYPECTOMY     TEE WITHOUT CARDIOVERSION N/A 10/18/2016   Procedure: TRANSESOPHAGEAL ECHOCARDIOGRAM (TEE);  Surgeon: Sarah Records, MD;  Location: [facility];  Service: Cardiovascular;  Laterality: N/A;   TONSILLECTOMY      There were no vitals filed for this visit.   Subjective Assessment - 11/12/20 1234     Pain Score 5     Pain Location Hand    Pain Orientation Right;Left    Pain Descriptors / Indicators Cramping    Pain Type Chronic pain    Pain Onset More than a month ago    Pain Relieving Factors N/A                   ADULT SLP TREATMENT - 11/12/20 1244       General Information   Behavior/Cognition Alert;Cooperative  Treatment Provided   Treatment provided Cognitive-Linquistic      Pain Assessment   Pain Assessment No/denies pain      Cognitive-Linquistic Treatment   Treatment focused on Cognition    Skilled Treatment SLP trained pt on external memory strategies (SLP Insights handout).She reports most difficulty with  "remembering how to get somewhere". SLP demonstrated how to navigate GPS on pt's phone. SLP and pt developed written steps to serve as a visual support. After initial model, pt was able to navigate from this location to home. Pt required modA to complete route from here to [place] to home. Rec cont practice.      Assessment / Recommendations / Plan   Plan Continue with current plan of  care      Progression Toward Goals   Progression toward goals Progressing toward goals                SLP Short Term Goals - 11/12/20 1239       SLP SHORT TERM GOAL #1   Title Pt will carryover compensatory strategies to report misplacing items, such as DM monitor, 3 or less times a week    Time 6    Period Weeks    Status On-going      SLP SHORT TERM GOAL #2   Title Pt will carryover 2 environmental strategies to support attention and word recall in 15 minute conversations with mod I    Time 6    Period Weeks    Status On-going      SLP SHORT TERM GOAL #3   Title Pt will use 2 strategies to recall directions when driving with mod I, including GPS as tolerated    Time 6    Period Weeks    Status On-going              SLP Long Term Goals - 11/12/20 1240       SLP LONG TERM GOAL #1   Title Pt will carryover compensatory strategies for word finding over 20 minute conversation with rare min A    Time 12    Period Weeks    Status On-going      SLP LONG TERM GOAL #2   Title Pt will carryover 2 strategies to recall conversations and information from her healthcare providers with rare min A    Time 12    Period Weeks    Status On-going      SLP LONG TERM GOAL #3   Title Pt will imrpve 3 areas rated to occur often or sometimes on the Memory Mistakes Questionnaire    Time 12    Period Weeks    Status On-going              Plan - 11/12/20 1237     Clinical Impression Statement See tx note. Sarah [name] was referred by neurology due to self reported memory concerns. Sarah [name] presents with mild memory and attention impairments. SLP recommends skilled ST to train [name] in compensatory strategies for cognition, implement supports for cognition for safety, independence, DM management and QOL. Cont with current POC.    Speech Therapy Frequency 2x / week    Duration 12 weeks    Treatment/Interventions Patient/family education;Internal/external aids;Functional  tasks;Cueing hierarchy;Compensatory techniques;Environmental controls;Cognitive reorganization;Multimodal communcation approach;Compensatory strategies;Language facilitation    Potential to Achieve Goals Good    Consulted and Agree with Plan of Care Patient  Patient will benefit from skilled therapeutic intervention in order to improve the following deficits and impairments:   Cognitive communication deficit    Problem List Patient Active Problem List   Diagnosis Date Noted   Coronary artery disease of native artery of native heart with stable angina pectoris ([facility]) 03/04/2020   Diabetes mellitus ([facility]) 03/03/2020   Aortic atherosclerosis ([facility]) 02/12/2020   Bilateral leg cramps 01/21/2020   Anxiety disorder 01/21/2020   Type 2 diabetes mellitus with hyperglycemia, with long-term current use of insulin ([facility]) 08/15/2019   Type 2 diabetes mellitus with diabetic polyneuropathy, with long-term current use of insulin ([facility]) 08/15/2019   Type 2 diabetes mellitus with microalbuminuria, with long-term current use of insulin ([facility]) 08/15/2019   Hyperlipidemia LDL goal <70 08/15/2019   Bilateral carpal tunnel syndrome 03/27/2019   Osteoarthritis of both knees 11/27/2018   CKD (chronic kidney disease), stage II 11/27/2018   GERD (gastroesophageal reflux disease) 06/26/2018   Homonymous hemianopia, left 02/08/2018   Diabetes mellitus with coincident hypertension (HCC)    Trigger finger, right index finger 10/17/2017   Low back pain 07/29/2017   Carotid artery stenosis 11/02/2016   Left arm weakness    Diastolic dysfunction    Hypertension with heart disease    Acute blood loss anemia    Cerebral infarction due to embolism of right middle cerebral artery ([facility])    Perirectal abscess 09/26/2015   Vitamin D deficiency 07/23/2015   Osteopenia 02/16/2013   Ankle fracture 01/19/2013   Anemia 09/27/2011   Type II diabetes mellitus with nephropathy ([facility]) 09/24/2011   Mixed  hyperlipidemia 09/24/2011    Verdene Lennert MS, [facility], CBIS  11/12/2020, 1:14 PM  [facility] [address] [place], [place], [address] Phone: [phone]   Fax:  [phone]   Name: Sarah [name] MRN: [id number] Date of Birth: 1945/06/07

## 2020-11-14 ENCOUNTER — Ambulatory Visit: Payer: Medicare Other

## 2020-11-14 ENCOUNTER — Other Ambulatory Visit: Payer: Self-pay

## 2020-11-14 DIAGNOSIS — R293 Abnormal posture: Secondary | ICD-10-CM | POA: Diagnosis not present

## 2020-11-14 DIAGNOSIS — G8929 Other chronic pain: Secondary | ICD-10-CM

## 2020-11-14 DIAGNOSIS — R2681 Unsteadiness on feet: Secondary | ICD-10-CM | POA: Diagnosis not present

## 2020-11-14 DIAGNOSIS — R2689 Other abnormalities of gait and mobility: Secondary | ICD-10-CM

## 2020-11-14 DIAGNOSIS — R41841 Cognitive communication deficit: Secondary | ICD-10-CM

## 2020-11-14 DIAGNOSIS — M25561 Pain in right knee: Secondary | ICD-10-CM | POA: Diagnosis not present

## 2020-11-14 DIAGNOSIS — M6281 Muscle weakness (generalized): Secondary | ICD-10-CM | POA: Diagnosis not present

## 2020-11-14 DIAGNOSIS — R262 Difficulty in walking, not elsewhere classified: Secondary | ICD-10-CM | POA: Diagnosis not present

## 2020-11-14 NOTE — Therapy (Signed)
[facility] [address] [place], [place], [address] Phone: [phone]   Fax:  [phone]  Speech Language Pathology Treatment  Patient Details  Name: Sarah [name] MRN: [id number] Date of Birth: 06/27/1945 Referring Provider (SLP): Frann Rider, NP   Encounter Date: 11/14/2020   End of Session - 11/14/20 1254     Visit Number 3    Number of Visits 25    Date for SLP Re-Evaluation 01/23/21    SLP Start Time 1315    SLP Stop Time  7564    SLP Time Calculation (min) 40 min    Activity Tolerance Patient tolerated treatment well             Past Medical History:  Diagnosis Date   Bilateral carpal tunnel syndrome 03/27/2019   Boils    Glaucoma    Heart murmur    HTN (hypertension)    Hx of adenomatous colonic polyps    Hyperlipidemia    Osteoporosis    Pneumonia    Stroke ([facility])    Type II or unspecified type diabetes mellitus without mention of complication, uncontrolled     Past Surgical History:  Procedure Laterality Date   ABDOMINAL HYSTERECTOMY     CARPAL TUNNEL RELEASE     right   COLONOSCOPY  12-10-10   per Dr. Deatra Ina, clear, repeat in 7 yrs    CORONARY STENT INTERVENTION N/A 03/11/2020   Procedure: CORONARY STENT INTERVENTION;  Surgeon: Jettie Booze, MD;  Location: [facility];  Service: Cardiovascular;  Laterality: N/A;   INCISION AND DRAINAGE PERIRECTAL ABSCESS N/A 09/26/2015   Procedure: IRRIGATION AND DEBRIDEMENT PERIRECTAL ABSCESS;  Surgeon: Mickeal Skinner, MD;  Location: [facility];  Service: General;  Laterality: N/A;   INTRAVASCULAR ULTRASOUND/IVUS N/A 03/11/2020   Procedure: Intravascular Ultrasound/IVUS;  Surgeon: Jettie Booze, MD;  Location: [facility];  Service: Cardiovascular;  Laterality: N/A;   KNEE ARTHROSCOPY     right   LEFT HEART CATH AND CORONARY ANGIOGRAPHY N/A 03/11/2020   Procedure: LEFT HEART CATH AND CORONARY ANGIOGRAPHY;  Surgeon:  Jettie Booze, MD;  Location: [facility];  Service: Cardiovascular;  Laterality: N/A;   LOOP RECORDER INSERTION N/A 10/19/2016   Procedure: LOOP RECORDER INSERTION;  Surgeon: Thompson Grayer, MD;  Location: [facility];  Service: Cardiovascular;  Laterality: N/A;   ORIF ANKLE FRACTURE Right 03/24/2012   Procedure: OPEN REDUCTION INTERNAL FIXATION (ORIF) ANKLE FRACTURE;  Surgeon: Newt Minion, MD;  Location: [facility];  Service: Orthopedics;  Laterality: Right;  Open Reduction Internal Fixation Right Bimalleolar ankle fracture   POLYPECTOMY     TEE WITHOUT CARDIOVERSION N/A 10/18/2016   Procedure: TRANSESOPHAGEAL ECHOCARDIOGRAM (TEE);  Surgeon: Fay Records, MD;  Location: [facility];  Service: Cardiovascular;  Laterality: N/A;   TONSILLECTOMY      There were no vitals filed for this visit.   Subjective Assessment - 11/14/20 1356     Subjective "I'm using my GPS"    Currently in Pain? No/denies                   ADULT SLP TREATMENT - 11/14/20 1254       General Information   Behavior/Cognition Alert;Cooperative      Treatment Provided   Treatment provided Cognitive-Linquistic      Cognitive-Linquistic Treatment   Treatment focused on Cognition    Skilled Treatment SLP recalled ST session targeting GPS and handout, which pt  has been implementing with reported accuracy. SLP generated strategy to recall DM monitor that is charged in room every night. Visual aid created and provided today. SLP provided expectation to grab montior every day for next ST session. Pt is using note taking and MyChart after visit summary to recall appointment details. SLP generated strategy of sticky notes placed in book to aid recall while reading. Pt inquired about her speech, in which pt exhibited mild reduced rate but speech was intelligible. SLP suggested pt complete refresher of old ST recommendations if she would like to address. No overt concerns felt for speech at this time. Pt  will inquire with previous SLP next week for further analysis from old conversational partner.      Assessment / Recommendations / Plan   Plan Continue with current plan of care      Progression Toward Goals   Progression toward goals Progressing toward goals                SLP Short Term Goals - 11/14/20 1255       SLP SHORT TERM GOAL #1   Title Pt will carryover compensatory strategies to report misplacing items, such as DM monitor, 3 or less times a week    Time 6    Period Weeks    Status On-going      SLP SHORT TERM GOAL #2   Title Pt will carryover 2 environmental strategies to support attention and word recall in 15 minute conversations with mod I    Time 6    Period Weeks    Status On-going      SLP SHORT TERM GOAL #3   Title Pt will use 2 strategies to recall directions when driving with mod I, including GPS as tolerated    Baseline 11-14-20    Time 6    Period Weeks    Status On-going              SLP Long Term Goals - 11/14/20 1255       SLP LONG TERM GOAL #1   Title Pt will carryover compensatory strategies for word finding over 20 minute conversation with rare min A    Time 12    Period Weeks    Status On-going      SLP LONG TERM GOAL #2   Title Pt will carryover 2 strategies to recall conversations and information from her healthcare providers with rare min A    Baseline 11-14-20    Time 12    Period Weeks    Status On-going      SLP LONG TERM GOAL #3   Title Pt will imrpve 3 areas rated to occur often or sometimes on the Memory Mistakes Questionnaire    Time 12    Period Weeks    Status On-going              Plan - 11/14/20 1254     Clinical Impression Statement Sarah [name] was referred by neurology due to self reported memory concerns. Sarah [name] presents with mild memory and attention impairments. Ongoing education and training completed for functional strategies to aid recall and attention. SLP recommends skilled ST to train  Sarah [name] in compensatory strategies for cognition, implement supports for cognition for safety, independence, DM management and QOL. Cont with current POC.    Speech Therapy Frequency 2x / week    Duration 12 weeks    Treatment/Interventions Patient/family education;Internal/external aids;Functional tasks;Cueing hierarchy;Compensatory techniques;Environmental controls;Cognitive reorganization;Multimodal communcation approach;Compensatory strategies;Language facilitation  Potential to Achieve Goals Good    Consulted and Agree with Plan of Care Patient             Patient will benefit from skilled therapeutic intervention in order to improve the following deficits and impairments:   Cognitive communication deficit    Problem List Patient Active Problem List   Diagnosis Date Noted   Coronary artery disease of native artery of native heart with stable angina pectoris ([facility]) 03/04/2020   Diabetes mellitus ([facility]) 03/03/2020   Aortic atherosclerosis ([facility]) 02/12/2020   Bilateral leg cramps 01/21/2020   Anxiety disorder 01/21/2020   Type 2 diabetes mellitus with hyperglycemia, with long-term current use of insulin ([facility]) 08/15/2019   Type 2 diabetes mellitus with diabetic polyneuropathy, with long-term current use of insulin ([facility]) 08/15/2019   Type 2 diabetes mellitus with microalbuminuria, with long-term current use of insulin ([facility]) 08/15/2019   Hyperlipidemia LDL goal <70 08/15/2019   Bilateral carpal tunnel syndrome 03/27/2019   Osteoarthritis of both knees 11/27/2018   CKD (chronic kidney disease), stage II 11/27/2018   GERD (gastroesophageal reflux disease) 06/26/2018   Homonymous hemianopia, left 02/08/2018   Diabetes mellitus with coincident hypertension (HCC)    Trigger finger, right index finger 10/17/2017   Low back pain 07/29/2017   Carotid artery stenosis 11/02/2016   Left arm weakness    Diastolic dysfunction    Hypertension with heart disease    Acute blood loss anemia     Cerebral infarction due to embolism of right middle cerebral artery ([facility])    Perirectal abscess 09/26/2015   Vitamin D deficiency 07/23/2015   Osteopenia 02/16/2013   Ankle fracture 01/19/2013   Anemia 09/27/2011   Type II diabetes mellitus with nephropathy ([facility]) 09/24/2011   Mixed hyperlipidemia 09/24/2011    Sarah Deem, MA CCC-SLP 11/14/2020, 1:56 PM  [facility] [address] [place], [place], [address] Phone: [phone]   Fax:  [phone]   Name: Sarah [name] MRN: [id number] Date of Birth: Mar 14, 1945

## 2020-11-14 NOTE — Therapy (Signed)
[facility] [address] [place], [place], [address] Phone: [phone]   Fax:  [phone]  Physical Therapy Treatment  Patient Details  Name: Sarah [name] MRN: [id number] Date of Birth: Jan 01, 1946 Referring Provider (PT): Frann Rider, NP   Encounter Date: 11/14/2020   PT End of Session - 11/14/20 1327     Visit Number 9    Number of Visits 17    Date for PT Re-Evaluation 11/21/20    Authorization Type [employer] Medicare - 10th visit PN    PT Start Time 1232    PT Stop Time 1315    PT Time Calculation (min) 43 min    Equipment Utilized During Treatment Gait belt    Activity Tolerance Patient tolerated treatment well    Behavior During Therapy [facility] for tasks assessed/performed             Past Medical History:  Diagnosis Date   Bilateral carpal tunnel syndrome 03/27/2019   Boils    Glaucoma    Heart murmur    HTN (hypertension)    Hx of adenomatous colonic polyps    Hyperlipidemia    Osteoporosis    Pneumonia    Stroke ([place])    Type II or unspecified type diabetes mellitus without mention of complication, uncontrolled     Past Surgical History:  Procedure Laterality Date   ABDOMINAL HYSTERECTOMY     CARPAL TUNNEL RELEASE     right   COLONOSCOPY  12-10-10   per Dr. Deatra Ina, clear, repeat in 7 yrs    CORONARY STENT INTERVENTION N/A 03/11/2020   Procedure: CORONARY STENT INTERVENTION;  Surgeon: Jettie Booze, MD;  Location: [facility];  Service: Cardiovascular;  Laterality: N/A;   INCISION AND DRAINAGE PERIRECTAL ABSCESS N/A 09/26/2015   Procedure: IRRIGATION AND DEBRIDEMENT PERIRECTAL ABSCESS;  Surgeon: Mickeal Skinner, MD;  Location: [facility];  Service: General;  Laterality: N/A;   INTRAVASCULAR ULTRASOUND/IVUS N/A 03/11/2020   Procedure: Intravascular Ultrasound/IVUS;  Surgeon: Jettie Booze, MD;  Location: [facility];  Service: Cardiovascular;  Laterality: N/A;   KNEE  ARTHROSCOPY     right   LEFT HEART CATH AND CORONARY ANGIOGRAPHY N/A 03/11/2020   Procedure: LEFT HEART CATH AND CORONARY ANGIOGRAPHY;  Surgeon: Jettie Booze, MD;  Location: [facility];  Service: Cardiovascular;  Laterality: N/A;   LOOP RECORDER INSERTION N/A 10/19/2016   Procedure: LOOP RECORDER INSERTION;  Surgeon: Thompson Grayer, MD;  Location: [facility];  Service: Cardiovascular;  Laterality: N/A;   ORIF ANKLE FRACTURE Right 03/24/2012   Procedure: OPEN REDUCTION INTERNAL FIXATION (ORIF) ANKLE FRACTURE;  Surgeon: Newt Minion, MD;  Location: [facility];  Service: Orthopedics;  Laterality: Right;  Open Reduction Internal Fixation Right Bimalleolar ankle fracture   POLYPECTOMY     TEE WITHOUT CARDIOVERSION N/A 10/18/2016   Procedure: TRANSESOPHAGEAL ECHOCARDIOGRAM (TEE);  Surgeon: Fay Records, MD;  Location: [facility];  Service: Cardiovascular;  Laterality: N/A;   TONSILLECTOMY      There were no vitals filed for this visit.   Subjective Assessment - 11/14/20 1238     Subjective Pt states she's been feeling good since the last visit but states that she was sore after aquatic therapy.    Currently in Pain? No/denies                               [facility]  PT Treatment/Exercise - 11/14/20 1240       Ambulation/Gait   Ambulation/Gait Yes    Ambulation/Gait Assistance 5: Supervision    Ambulation/Gait Assistance Details outside    Assistive device None    Gait Pattern Step-through pattern;Decreased step length - right;Decreased step length - left;Decreased dorsiflexion - right;Decreased dorsiflexion - left    Ambulation Surface Unlevel;Outdoor      Neuro Re-ed    Neuro Re-ed Details  At the counter standing on rockerboard A&P 1x30" EO, EC, and EC with head turns up/down + side to side. Pt demonstrated increased sway with EC, PT provided min guard and verbal cues to maintain upright posture. Rockerboard placed laterally 2x30 each with EO/EC.  Pt demonstrated increased sway with EC and board laterally. PT had to provide Mod guard to keep pt up the first set of 30 sec with EC. At counter pt performed cone taps while standing on blue foam 2x10 each BLE. Pt demonstrated increased difficulty when standing on LLE. PT provided min guard and provided verbal cues for pt to go slow and controlled while lifting foot up.      Exercises   Exercises Other Exercises    Other Exercises  At counter pt performed x3 laps of reciprocal gait over hurdles focusing taking bigger steps. PT provided min guard w/ verbal cues to ensure proper technique. Pt demonstrated increased difficulty while standing on LLE and having to step with RLE. Pt performed x3 laps of side steps over hurdles. PT provided min guard and verbal cues to remind pt to get close to hurdle before stepping to ensure that there was enough space for trailing leg.                       PT Short Term Goals - 10/23/20 1553       PT SHORT TERM GOAL #1   Title Pt will be independent with HEP for improved strength, balance, transfers, and gait including aquatics as needed.    Baseline 10/23/20 Pt understands current HEP. Is starting aquatic therapy next week.    Time 4    Period Weeks    Status Partially Met    Target Date 10/24/20      PT SHORT TERM GOAL #2   Title Pt will decrease 5 x sit to stand to <13 sec for improved balance and functional mobility.    Baseline 09/26/20 15.87 sec, 10/23/20 13.23 sec    Time 4    Period Weeks    Status Partially Met    Target Date 10/24/20      PT SHORT TERM GOAL #3   Title Pt will increase FGA from 16/30 to >19/30 for improved balance and gait.    Baseline 09/26/20 16/30. 10/23/20 19/30    Time 4    Period Weeks    Status Partially Met    Target Date 10/24/20               PT Long Term Goals - 11/14/20 1329       PT LONG TERM GOAL #1   Title Pt will report <4/10 pain in left knee with activities for improved function. (LTGs  due 11/21/20)    Baseline 4/10    Time 8    Period Weeks    Status New      PT LONG TERM GOAL #2   Title Pt will increase gait speed to >0.59ms for improved community ambulation.    Baseline 09/26/20 0.728m  Time 8    Period Weeks    Status New      PT LONG TERM GOAL #3   Title Patient to improve FGA to >/= 24/30 demonstrating improved dynamic balance:    Baseline 09/26/20 16/30    Time 8    Period Weeks    Status New      PT LONG TERM GOAL #4   Title Pt will ambulate >1000' with cane versus no AD on varied surfaces mod I for improved community mobility.    Baseline 11/4-550' w/ no AD around building    Time 8    Period Weeks    Status New      PT LONG TERM GOAL #5   Title Lower Extremity Functional Scale goal TBD    Time 8    Period Weeks    Status New                   Plan - 11/14/20 1330     Clinical Impression Statement Today's session focused on further improving pt's gait and balance. Pt presents to the clinic with the ability to ambulate 550' outdoors over uneven terrain w/o use of assistive device. Pt still demonstrates deficits in balance especially with acitivities with her eyes closed or that isolate the LLE. Pt tolerated therapy well with no adverse s/s and appropriate fatigue/soreness at the end of session. PT discussed future POC including aquatic therapy with pt. PT will continue to progress pt as tolerated w/ activities to facilitate BLE strength, improve pt gait, and balance.    Stability/Clinical Decision Making Stable/Uncomplicated    Rehab Potential Good    PT Frequency 2x / week    PT Duration 8 weeks    PT Treatment/Interventions ADLs/Self Care Home Management;DME Instruction;Gait training;Functional mobility training;Therapeutic activities;Therapeutic exercise;Balance training;Neuromuscular re-education;Patient/family education;Aquatic Therapy;Manual techniques;Cryotherapy;Moist Heat;Passive range of motion    PT Next Visit Plan 10th visit  progress note next visit. Recert due end of next week. continue with LE strengthening, specifically left hip. balance training being mindful of pain. Work on standing on compliant surfaces.    PT Home Exercise Plan Access Code: [email or web address]             Patient will benefit from skilled therapeutic intervention in order to improve the following deficits and impairments:  Abnormal gait, Difficulty walking, Decreased balance, Decreased mobility, Decreased strength, Pain  Visit Diagnosis: Unsteadiness on feet  Other abnormalities of gait and mobility  Muscle weakness (generalized)  Chronic pain of left knee     Problem List Patient Active Problem List   Diagnosis Date Noted   Coronary artery disease of native artery of native heart with stable angina pectoris ([place]) 03/04/2020   Diabetes mellitus ([place]) 03/03/2020   Aortic atherosclerosis ([place]) 02/12/2020   Bilateral leg cramps 01/21/2020   Anxiety disorder 01/21/2020   Type 2 diabetes mellitus with hyperglycemia, with long-term current use of insulin ([place]) 08/15/2019   Type 2 diabetes mellitus with diabetic polyneuropathy, with long-term current use of insulin ([place]) 08/15/2019   Type 2 diabetes mellitus with microalbuminuria, with long-term current use of insulin ([place]) 08/15/2019   Hyperlipidemia LDL goal <70 08/15/2019   Bilateral carpal tunnel syndrome 03/27/2019   Osteoarthritis of both knees 11/27/2018   CKD (chronic kidney disease), stage II 11/27/2018   GERD (gastroesophageal reflux disease) 06/26/2018   Homonymous hemianopia, left 02/08/2018   Diabetes mellitus with coincident hypertension ([place])    Trigger finger, right index finger 10/17/2017   Low back  pain 07/29/2017   Carotid artery stenosis 11/02/2016   Left arm weakness    Diastolic dysfunction    Hypertension with heart disease    Acute blood loss anemia    Cerebral infarction due to embolism of right middle cerebral artery ([place])    Perirectal abscess 09/26/2015    Vitamin D deficiency 07/23/2015   Osteopenia 02/16/2013   Ankle fracture 01/19/2013   Anemia 09/27/2011   Type II diabetes mellitus with nephropathy ([place]) 09/24/2011   Mixed hyperlipidemia 09/24/2011    Lottie Mussel, Student-PT 11/14/2020, 6:57 PM  [facility] [address] [place], [place], [address] Phone: [phone]   Fax:  [phone]  Name: Sarah [name] MRN: [id number] Date of Birth: Jul 25, 1945

## 2020-11-14 NOTE — Patient Instructions (Addendum)
Continue to use your GPS and use those written instructions to remind yourself how to operate the GPS   Use sticky notes inside your book to write down any notes or a brief summary of what has occurred so far  Put new red sign on the back of your door to alert you to "Grab Dexcam Device." Try to make this a part of your routine and complete this every day. I will ask about this during your ST session on Wednesday   You can refresh yourself on old recommendations to target your speech clarity. Think about over-articulating or moving your mouth bigger. Reading aloud is good practice. Speaking slowly is okay if it helps you form the words correctly

## 2020-11-17 ENCOUNTER — Encounter: Payer: Self-pay | Admitting: Family Medicine

## 2020-11-17 ENCOUNTER — Other Ambulatory Visit: Payer: Self-pay

## 2020-11-17 ENCOUNTER — Ambulatory Visit (INDEPENDENT_AMBULATORY_CARE_PROVIDER_SITE_OTHER): Payer: Medicare Other | Admitting: Family Medicine

## 2020-11-17 VITALS — BP 126/70 | HR 90 | Resp 16 | Ht 60.0 in | Wt 142.1 lb

## 2020-11-17 DIAGNOSIS — I119 Hypertensive heart disease without heart failure: Secondary | ICD-10-CM

## 2020-11-17 DIAGNOSIS — M25562 Pain in left knee: Secondary | ICD-10-CM

## 2020-11-17 DIAGNOSIS — R350 Frequency of micturition: Secondary | ICD-10-CM | POA: Diagnosis not present

## 2020-11-17 DIAGNOSIS — G8929 Other chronic pain: Secondary | ICD-10-CM | POA: Diagnosis not present

## 2020-11-17 DIAGNOSIS — R809 Proteinuria, unspecified: Secondary | ICD-10-CM | POA: Diagnosis not present

## 2020-11-17 DIAGNOSIS — R600 Localized edema: Secondary | ICD-10-CM | POA: Diagnosis not present

## 2020-11-17 DIAGNOSIS — F419 Anxiety disorder, unspecified: Secondary | ICD-10-CM

## 2020-11-17 DIAGNOSIS — M25561 Pain in right knee: Secondary | ICD-10-CM | POA: Diagnosis not present

## 2020-11-17 LAB — URINALYSIS, ROUTINE W REFLEX MICROSCOPIC
Bilirubin Urine: NEGATIVE
Ketones, ur: NEGATIVE
Leukocytes,Ua: NEGATIVE
Nitrite: NEGATIVE
Specific Gravity, Urine: 1.01 (ref 1.000–1.030)
Total Protein, Urine: 100 — AB
Urine Glucose: >=1000 — AB
Urobilinogen, UA: 0.2 (ref 0.0–1.0)
pH: 6.5 (ref 5.0–8.0)

## 2020-11-17 MED ORDER — SERTRALINE HCL 50 MG PO TABS: 50.0000 mg | ORAL_TABLET | Freq: Every day | ORAL | 1 refills | Status: DC

## 2020-11-17 NOTE — Assessment & Plan Note (Signed)
Continue Furosemide 20 mg daily, side effects discussed. LE elevation and compression stocking. Adequate skin care.

## 2020-11-17 NOTE — Assessment & Plan Note (Addendum)
BP adequately controlled. Continue Amlodipine,Losartan,and Metoprolol tartrate same dose. Low salt diet recommended. Continue monitoring BP at home. She does not want blood work done today. Eye exam is current.

## 2020-11-17 NOTE — Patient Instructions (Addendum)
A few things to remember from today's visit:  Hypertension with heart disease  Anxiety disorder, unspecified type - Plan: sertraline (ZOLOFT) 50 MG tablet  Urinary frequency - Plan: Urinalysis, Routine w reflex microscopic  Bilateral lower extremity edema  Proteinuria, unspecified type - Plan: Protein / creatinine ratio, urine  If you need refills please call your pharmacy. Sertraline increased from 25 mg to 50 mg daily. Please keep appt with endocrinologist.  Do not use My Chart to request refills or for acute issues that need immediate attention. Wear compression stocking and elevated legs above heart level to help with leg swelling.   Try to avoid fluids 3-4 hours before bedtime.  Tighten and relax the pelvic muscles intermittently during the day may help with frequency and incontinence. Once you are familiar with exercise try to hold pelvic muscles contraction for about 8-10 seconds.in the beginning you may not be able to hold contraction for more than a second or 2 but eventually you will be able to hold contraction harder and for longer time. Perform  8-12 exercises 3 times per day and daily for 15-20 weeks. You will need to continue exercises indefinitely to have a lasting effect.  Kegel Exercises Kegel exercises can help strengthen your pelvic floor muscles. The pelvic floor is a group of muscles that support your rectum, small intestine, and bladder. In females, pelvic floor muscles also help support the uterus. These muscles help you control the flow of urine and stool (feces). Kegel exercises are painless and simple. They do not require any equipment. Your provider may suggest Kegel exercises to: Improve bladder and bowel control. Improve sexual response. Improve weak pelvic floor muscles after surgery to remove the uterus (hysterectomy) or after pregnancy, in females. Improve weak pelvic floor muscles after prostate gland removal or surgery, in males. Kegel exercises  involve squeezing your pelvic floor muscles. These are the same muscles you squeeze when you try to stop the flow of urine or keep from passing gas. The exercises can be done while sitting, standing, or lying down, but it is best to vary your position. Ask your health care provider which exercises are safe for you. Do exercises exactly as told by your health care provider and adjust them as directed. Do not begin these exercises until told by your health care provider. Exercises How to do Kegel exercises: Squeeze your pelvic floor muscles tight. You should feel a tight lift in your rectal area. If you are a female, you should also feel a tightness in your vaginal area. Keep your stomach, buttocks, and legs relaxed. Hold the muscles tight for up to 10 seconds. Breathe normally. Relax your muscles for up to 10 seconds. Repeat as told by your health care provider. Repeat this exercise daily as told by your health care provider. Continue to do this exercise for at least 4-6 weeks, or for as long as told by your health care provider. You may be referred to a physical therapist who can help you learn more about how to do Kegel exercises. Depending on your condition, your health care provider may recommend: Varying how long you squeeze your muscles. Doing several sets of exercises every day. Doing exercises for several weeks. Making Kegel exercises a part of your regular exercise routine. This information is not intended to replace advice given to you by your health care provider. Make sure you discuss any questions you have with your health care provider. Document Revised: 05/08/2020 Document Reviewed: 05/08/2020 Elsevier Patient Education  2022  [place].  Please be sure medication list is accurate. If a new problem present, please set up appointment sooner than planned today.

## 2020-11-17 NOTE — Assessment & Plan Note (Signed)
Sertraline has helped but she feels like a higher dose will help more, so increase dose from 25 mg to 50 mg daily. Instructed about warning signs.

## 2020-11-17 NOTE — Progress Notes (Signed)
HPI: Sarah [name] is a 75 y.o. female, who is here today for 5 months follow up.   She was last seen on 06/16/20. Since her last visit she has seen ortho. Started aquatic exercises twice per week. Still having knee pain, has improved, L>R. She is applying voltaren gel, which helps some. Pain is exacerbated by standing,walking.  She is having dental work, planning on tooth extractions.  Hypertension:  Medications:Metoprolol Tartrate 25 mg bid,Losartan 50 mg daily, and Amlodipine 10 mg daily. BP readings at home:120-130/70's. Side effects:None  Negative for unusual or severe headache, visual changes, exertional chest pain, dyspnea,  new focal weakness, or worsening edema. LE edema: She is on Furosemide 20 mg daily. Problem worse at the end of the day. She is not wearing compression stockings.  Lab Results  Component Value Date   CREATININE 0.65 03/04/2020   BUN 16 03/04/2020   NA 137 03/04/2020   K 4.2 03/04/2020   CL 102 03/04/2020   CO2 21 03/04/2020   DM2: Following with endocrinologist. Next appt 11/21/20. According to pt, her cardiologist recommended stopping [place]. She was hoping that this would help with urinary frequency but it seems to be worse. + Proteinuria.  UA in 10/2019 was negative for blood, 3+ protein. She has not noted gross hematuria, foam in urine,or decreased urine output.  Lab Results  Component Value Date   MICROALBUR 96.5 (H) 04/27/2019   MICROALBUR 326.6 (H) 11/27/2018   Urinary frequency. Nocturia x 2-3, urgency. Sometimes urinary incontinence. She has had symptoms for 5-6 months.  She is wearing depends.  Anxiety: She is on Sertraline 25 mg daily, which she thinks is helping but asking if dose can be increased. She has tolerated medication well. 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 Denies depressed mood.  12/16/20 colonoscopy consultation. 11/21/20 mammogram. Eye exam next week, 3 months  f/u.  Review of Systems  Constitutional:  Positive for fatigue. Negative for appetite change and fever.  Respiratory:  Negative for cough and wheezing.   Gastrointestinal:  Negative for abdominal pain, nausea and vomiting.  Genitourinary:  Negative for difficulty urinating and dysuria.  Musculoskeletal:  Positive for arthralgias and gait problem.  Skin:  Negative for rash.  Neurological:  Negative for syncope and facial asymmetry.  Psychiatric/Behavioral:  Negative for confusion and hallucinations.   Rest of ROS, see pertinent positives sand negatives in HPI  Current Outpatient Medications on File Prior to Visit  Medication Sig Dispense Refill   acetaminophen (TYLENOL) 500 MG tablet Take 1,000 mg by mouth every 6 (six) hours as needed for mild pain or headache.     amLODipine (NORVASC) 10 MG tablet TAKE 1 TABLET(10 MG) BY MOUTH DAILY 90 tablet 1   aspirin EC 81 MG tablet Swallow whole. TAKE daily until after heart Cath (Patient taking differently: Take 81 mg by mouth daily.) 30 tablet 11   atorvastatin (LIPITOR) 80 MG tablet TAKE 1 TABLET(80 MG) BY MOUTH DAILY AT 6 PM 90 tablet 2   Blood Glucose Calibration (OT ULTRA/FASTTK CNTRL SOLN) SOLN      calcium carbonate (TUMS EX) 750 MG chewable tablet Chew 1 tablet by mouth as needed for heartburn.     clopidogrel (PLAVIX) 75 MG tablet Take 1 tablet (75 mg total) by mouth daily. 90 tablet 1   Continuous Blood Gluc Sensor (DEXCOM G6 SENSOR) MISC 1 Device by Does not apply route as directed. 3 each 11   Continuous Blood Gluc Transmit (DEXCOM  G6 TRANSMITTER) MISC 1 Device by Does not apply route as directed. 1 each 3   dapagliflozin propanediol (FARXIGA) 10 MG TABS tablet Take 1 tablet (10 mg total) by mouth daily. 90 tablet 3   diclofenac Sodium (VOLTAREN) 1 % GEL Apply 2 g topically 4 (four) times daily. 150 g 3   ezetimibe (ZETIA) 10 MG tablet TAKE 1 TABLET(10 MG) BY MOUTH DAILY 90 tablet 3   furosemide (LASIX) 20 MG tablet TAKE 1 TABLET(20 MG)  BY MOUTH DAILY 30 tablet 2   insulin glargine (LANTUS SOLOSTAR) 100 UNIT/ML Solostar Pen Inject 64 Units into the skin daily. 60 mL 3   insulin lispro (HUMALOG KWIKPEN) 200 UNIT/ML KwikPen Inject 14 Units into the skin 2 (two) times daily with breakfast and lunch AND 18 Units daily with supper. 15 U 15 min before meals. 30 mL 3   Insulin Pen Needle 32G X 4 MM MISC 1 Device by Does not apply route in the morning, at noon, in the evening, and at bedtime. 400 each 3   Lancet Devices (SIMPLE DIAGNOSTICS LANCING DEV) MISC Check BS before and 2 hours after meals. 200 each 6   liraglutide (VICTOZA) 18 MG/3ML SOPN Inject 1.8 mg into the skin daily. 9 mL 3   losartan (COZAAR) 50 MG tablet TAKE 1 TABLET(50 MG) BY MOUTH DAILY 90 tablet 1   metoprolol tartrate (LOPRESSOR) 25 MG tablet Take 1 tablet (25 mg total) by mouth 2 (two) times daily. 180 tablet 3   nitroGLYCERIN (NITROSTAT) 0.4 MG SL tablet Place 1 tablet (0.4 mg total) under the tongue every 5 (five) minutes as needed for chest pain. one tab every 5 minutes up to 3 tablets total over 15 minutes. 25 tablet 3   ONE TOUCH ULTRA TEST test strip Check BS before and 2 hours after meals. 200 each 6   PHARMACIST CHOICE ALCOHOL 70 % PADS      ROCKLATAN 0.02-0.005 % SOLN Place 1 drop into both eyes every evening.     No current facility-administered medications on file prior to visit.   Past Medical History:  Diagnosis Date   Bilateral carpal tunnel syndrome 03/27/2019   Boils    Glaucoma    Heart murmur    HTN (hypertension)    Hx of adenomatous colonic polyps    Hyperlipidemia    Osteoporosis    Pneumonia    Stroke ([place])    Type II or unspecified type diabetes mellitus without mention of complication, uncontrolled    No Known Allergies  Social History   Socioeconomic History   Marital status: Single    Spouse name: Not on file   Number of children: Not on file   Years of education: Not on file   Highest education level: Not on file   Occupational History   Not on file  Tobacco Use   Smoking status: Former    Types: Cigarettes    Quit date: 10/15/2016    Years since quitting: 4.0   Smokeless tobacco: Never   Tobacco comments:    smokes occ.   Vaping Use   Vaping Use: Never used  Substance and Sexual Activity   Alcohol use: Yes    Alcohol/week: 0.0 standard drinks    Comment: occ   Drug use: No   Sexual activity: Not on file  Other Topics Concern   Not on file  Social History Narrative   Not on file   Social Determinants of Health   Financial Resource Strain: Low  Risk    Difficulty of Paying Living Expenses: Not hard at all  Food Insecurity: No Food Insecurity   Worried About [place] in the Last Year: Never true   Ran Out of Food in the Last Year: Never true  Transportation Needs: No Transportation Needs   Lack of Transportation (Medical): No   Lack of Transportation (Non-Medical): No  Physical Activity: Inactive   Days of Exercise per Week: 0 days   Minutes of Exercise per Session: 0 min  Stress: No Stress Concern Present   Feeling of Stress : Not at all  Social Connections: Socially Isolated   Frequency of Communication with Friends and Family: More than three times a week   Frequency of Social Gatherings with Friends and Family: More than three times a week   Attends Religious Services: Never   Marine scientist or Organizations: No   Attends Archivist Meetings: Never   Marital Status: Divorced   Vitals:   11/17/20 1349  BP: 126/70  Pulse: 90  Resp: 16  SpO2: 97%   Body mass index is 27.76 kg/m.  Physical Exam Vitals and nursing note reviewed.  Constitutional:      General: She is not in acute distress.    Appearance: She is well-developed.  HENT:     Head: Normocephalic and atraumatic.     Mouth/Throat:     Mouth: Mucous membranes are moist.     Dentition: Abnormal dentition. Dental caries present.     Pharynx: Oropharynx is clear.  Eyes:      Conjunctiva/sclera: Conjunctivae normal.  Cardiovascular:     Rate and Rhythm: Normal rate and regular rhythm.     Pulses:          Dorsalis pedis pulses are 2+ on the right side and 2+ on the left side.     Heart sounds: Murmur (SEM I/VI RUSB) heard.  Pulmonary:     Effort: Pulmonary effort is normal. No respiratory distress.     Breath sounds: Normal breath sounds.  Abdominal:     Palpations: Abdomen is soft. There is no hepatomegaly or mass.     Tenderness: There is no abdominal tenderness.  Musculoskeletal:     Right lower leg: 1+ Pitting Edema present.     Left lower leg: 1+ Pitting Edema present.  Lymphadenopathy:     Cervical: No cervical adenopathy.  Skin:    General: Skin is warm.     Findings: No erythema or rash.  Neurological:     General: No focal deficit present.     Mental Status: She is alert and oriented to person, place, and time.     Cranial Nerves: No cranial nerve deficit.     Comments: Mildly unstable gait, antalgic, not assisted.  Psychiatric:     Comments: Well groomed, good eye contact.   ASSESSMENT AND PLAN:  Ms. SHIASIA PORRO was seen today for 5 months follow-up.  Orders Placed This Encounter  Procedures   Urinalysis, Routine w reflex microscopic   Protein / creatinine ratio, urine   Basic metabolic panel   Proteinuria, unspecified type She is not longer on Farxiga. Stressed the importance of adequate glucose and BP controlled. Adequate hydration. Continue Losartan. Further recommendations according to lab results. BMP to be done with next blood work, she doe snot want blood work today.  Urinary frequency We discussed possible etiologies, including overactive bladder and hyperglycemia. UA ordered today. She is not interested in pharmacologic treatment, we  reviewed options and some side effects.She would like to wait until her HgA1C is re-checked. Avoid fluid intake 3-4 hours before bedtime may help with nocturia. Kegel exercises  may help.  Hypertension with heart disease BP adequately controlled. Continue Amlodipine,Losartan,and Metoprolol tartrate same dose. Low salt diet recommended. Continue monitoring BP at home. She does not want blood work done today. Eye exam is current.  Anxiety disorder Sertraline has helped but she feels like a higher dose will help more, so increase dose from 25 mg to 50 mg daily. Instructed about warning signs.  Bilateral lower extremity edema Continue Furosemide 20 mg daily, side effects discussed. LE elevation and compression stocking. Adequate skin care.  Chronic pain of both knees Continue aquatic exercises and topical Voltaren. We discussed some side effects of oral NSAID's. Continue following with ortho.  I spent a total of 42 minutes in both face to face and non face to face activities for this visit on the date of this encounter. During this time history was obtained and documented, examination was performed, prior labs reviewed, and assessment/plan discussed.  Return in about 5 months (around 04/17/2021) for 5-6 months..  Evelena Masci G. [name], MD  [facility]. [place] office.

## 2020-11-18 LAB — PROTEIN / CREATININE RATIO, URINE
Creatinine, Urine: 25 mg/dL (ref 20–275)
Protein/Creat Ratio: 7960 mg/g creat — ABNORMAL HIGH (ref 24–184)
Protein/Creatinine Ratio: 7.96 mg/mg creat — ABNORMAL HIGH (ref 0.024–0.184)
Total Protein, Urine: 199 mg/dL — ABNORMAL HIGH (ref 5–24)

## 2020-11-19 ENCOUNTER — Ambulatory Visit: Payer: Medicare Other

## 2020-11-19 ENCOUNTER — Encounter: Payer: Self-pay | Admitting: Speech Pathology

## 2020-11-19 ENCOUNTER — Ambulatory Visit: Payer: Medicare Other | Admitting: Speech Pathology

## 2020-11-19 ENCOUNTER — Other Ambulatory Visit: Payer: Self-pay

## 2020-11-19 ENCOUNTER — Other Ambulatory Visit: Payer: Self-pay | Admitting: Family Medicine

## 2020-11-19 DIAGNOSIS — R2689 Other abnormalities of gait and mobility: Secondary | ICD-10-CM

## 2020-11-19 DIAGNOSIS — G8929 Other chronic pain: Secondary | ICD-10-CM | POA: Diagnosis not present

## 2020-11-19 DIAGNOSIS — R2681 Unsteadiness on feet: Secondary | ICD-10-CM | POA: Diagnosis not present

## 2020-11-19 DIAGNOSIS — R41841 Cognitive communication deficit: Secondary | ICD-10-CM

## 2020-11-19 DIAGNOSIS — R262 Difficulty in walking, not elsewhere classified: Secondary | ICD-10-CM | POA: Diagnosis not present

## 2020-11-19 DIAGNOSIS — M25561 Pain in right knee: Secondary | ICD-10-CM | POA: Diagnosis not present

## 2020-11-19 DIAGNOSIS — M6281 Muscle weakness (generalized): Secondary | ICD-10-CM

## 2020-11-19 DIAGNOSIS — R293 Abnormal posture: Secondary | ICD-10-CM | POA: Diagnosis not present

## 2020-11-19 NOTE — Therapy (Signed)
[facility] [address] [place], [place], [address] Phone: [phone]   Fax:  [phone]  Speech Language Pathology Treatment  Patient Details  Name: Sarah [name] MRN: [id number] Date of Birth: [birth year] Referring Provider (SLP): Sarah Rider, NP   Encounter Date: 11/19/2020   End of Session - 11/19/20 1236     Visit Number 4    Number of Visits 25    Date for SLP Re-Evaluation 01/23/21    SLP Start Time 43    SLP Stop Time  2952    SLP Time Calculation (min) 45 min    Activity Tolerance Patient tolerated treatment well             Past Medical History:  Diagnosis Date   Bilateral carpal tunnel syndrome 03/27/2019   Boils    Glaucoma    Heart murmur    HTN (hypertension)    Hx of adenomatous colonic polyps    Hyperlipidemia    Osteoporosis    Pneumonia    Stroke ([place])    Type II or unspecified type diabetes mellitus without mention of complication, uncontrolled     Past Surgical History:  Procedure Laterality Date   ABDOMINAL HYSTERECTOMY     CARPAL TUNNEL RELEASE     right   COLONOSCOPY  12-10-10   per Dr. Deatra [name], clear, repeat in 7 yrs    CORONARY STENT INTERVENTION N/A 03/11/2020   Procedure: CORONARY STENT INTERVENTION;  Surgeon: Sarah Booze, MD;  Location: [facility];  Service: Cardiovascular;  Laterality: N/A;   INCISION AND DRAINAGE PERIRECTAL ABSCESS N/A 09/26/2015   Procedure: IRRIGATION AND DEBRIDEMENT PERIRECTAL ABSCESS;  Surgeon: Sarah Skinner, MD;  Location: [facility];  Service: General;  Laterality: N/A;   INTRAVASCULAR ULTRASOUND/IVUS N/A 03/11/2020   Procedure: Intravascular Ultrasound/IVUS;  Surgeon: Sarah Booze, MD;  Location: [facility];  Service: Cardiovascular;  Laterality: N/A;   KNEE ARTHROSCOPY     right   LEFT HEART CATH AND CORONARY ANGIOGRAPHY N/A 03/11/2020   Procedure: LEFT HEART CATH AND CORONARY ANGIOGRAPHY;  Surgeon:  Sarah Booze, MD;  Location: [facility];  Service: Cardiovascular;  Laterality: N/A;   LOOP RECORDER INSERTION N/A 10/19/2016   Procedure: LOOP RECORDER INSERTION;  Surgeon: Sarah Grayer, MD;  Location: [facility];  Service: Cardiovascular;  Laterality: N/A;   ORIF ANKLE FRACTURE Right 03/24/2012   Procedure: OPEN REDUCTION INTERNAL FIXATION (ORIF) ANKLE FRACTURE;  Surgeon: Sarah Minion, MD;  Location: [facility];  Service: Orthopedics;  Laterality: Right;  Open Reduction Internal Fixation Right Bimalleolar ankle fracture   POLYPECTOMY     TEE WITHOUT CARDIOVERSION N/A 10/18/2016   Procedure: TRANSESOPHAGEAL ECHOCARDIOGRAM (TEE);  Surgeon: Sarah Records, MD;  Location: [facility];  Service: Cardiovascular;  Laterality: N/A;   TONSILLECTOMY      There were no vitals filed for this visit.   Subjective Assessment - 11/19/20 1234     Subjective "I've been using my GPS and note for my DM monitor."    Currently in Pain? No/denies                   ADULT SLP TREATMENT - 11/19/20 1243       General Information   Behavior/Cognition Alert;Cooperative      Treatment Provided   Treatment provided Cognitive-Linquistic      Pain Assessment   Pain Assessment No/denies pain      Cognitive-Linquistic Treatment  Treatment focused on Cognition    Skilled Treatment SLP reviewed previous session with patient. Pt shared she has been using her GPS and is now using notes to remember her DM monitor. She reports feeling "good" about how those strategies have been working for her. SLP targeted internal memory strategies this session. 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 She completed activity with rare minA.      Assessment / Recommendations / Plan   Plan Continue with current plan of care       Progression Toward Goals   Progression toward goals Progressing toward goals                SLP Short Term Goals - 11/19/20 1238       SLP SHORT TERM GOAL #1   Title Pt will carryover compensatory strategies to report misplacing items, such as DM monitor, 3 or less times a week    Time 6    Period Weeks    Status On-going    Target Date 12/10/20      SLP SHORT TERM GOAL #2   Title Pt will carryover 2 environmental strategies to support attention and word recall in 15 minute conversations with mod I    Time 6    Period Weeks    Status On-going    Target Date 12/10/20      SLP SHORT TERM GOAL #3   Title Pt will use 2 strategies to recall directions when driving with mod I, including GPS as tolerated    Baseline 11-14-20    Time 6    Period Weeks    Status On-going    Target Date 12/10/20              SLP Long Term Goals - 11/19/20 1240       SLP LONG TERM GOAL #1   Title Pt will carryover compensatory strategies for word finding over 20 minute conversation with rare min A    Time 12    Period Weeks    Status On-going    Target Date 01/13/21      SLP LONG TERM GOAL #2   Title Pt will carryover 2 strategies to recall conversations and information from her healthcare providers with rare min A    Baseline 11-14-20    Time 12    Period Weeks    Status On-going    Target Date 01/13/21      SLP LONG TERM GOAL #3   Title Pt will imrpve 3 areas rated to occur often or sometimes on the Memory Mistakes Questionnaire    Time 12    Period Weeks    Status On-going    Target Date 01/13/21              Plan - 11/19/20 1236     Clinical Impression Statement See tx note. Sarah [name] was referred by neurology due to self reported memory concerns. Sarah [name] presents with mild memory and attention impairments. Ongoing education and training completed for functional strategies to aid recall and attention. SLP recommends skilled ST to train Sarah [name] in compensatory  strategies for cognition, implement supports for cognition for safety, independence, DM management and QOL. Cont with current POC.    Speech Therapy Frequency 2x / week    Duration 12 weeks    Treatment/Interventions Patient/family education;Internal/external  aids;Functional tasks;Cueing hierarchy;Compensatory techniques;Environmental controls;Cognitive reorganization;Multimodal communcation approach;Compensatory strategies;Language facilitation    Potential to Achieve Goals Good    Consulted and Agree with Plan of Care Patient             Patient will benefit from skilled therapeutic intervention in order to improve the following deficits and impairments:   Cognitive communication deficit    Problem List Patient Active Problem List   Diagnosis Date Noted   Coronary artery disease of native artery of native heart with stable angina pectoris ([place]) 03/04/2020   Diabetes mellitus ([place]) 03/03/2020   Aortic atherosclerosis ([place]) 02/12/2020   Bilateral leg cramps 01/21/2020   Bilateral lower extremity edema 01/21/2020   Anxiety disorder 01/21/2020   Type 2 diabetes mellitus with hyperglycemia, with long-term current use of insulin ([place]) 08/15/2019   Type 2 diabetes mellitus with diabetic polyneuropathy, with long-term current use of insulin ([place]) 08/15/2019   Type 2 diabetes mellitus with microalbuminuria, with long-term current use of insulin ([place]) 08/15/2019   Hyperlipidemia LDL goal <70 08/15/2019   Bilateral carpal tunnel syndrome 03/27/2019   Osteoarthritis of both knees 11/27/2018   CKD (chronic kidney disease), stage II 11/27/2018   GERD (gastroesophageal reflux disease) 06/26/2018   Homonymous hemianopia, left 02/08/2018   Diabetes mellitus with coincident hypertension (HCC)    Trigger finger, right index finger 10/17/2017   Low back pain 07/29/2017   Carotid artery stenosis 11/02/2016   Left arm weakness    Diastolic dysfunction    Hypertension with heart disease     Acute blood loss anemia    Cerebral infarction due to embolism of right middle cerebral artery ([place])    Perirectal abscess 09/26/2015   Vitamin D deficiency 07/23/2015   Osteopenia 02/16/2013   Ankle fracture 01/19/2013   Anemia 09/27/2011   Type II diabetes mellitus with nephropathy ([place]) 09/24/2011   Mixed hyperlipidemia 09/24/2011    Verdene Lennert MS, [place], CBIS  11/19/2020, 1:02 PM  [facility] [address] [place], [place], [address] Phone: [phone]   Fax:  [phone]   Name: Sarah [name] MRN: [id number] Date of Birth: 06/29/1945

## 2020-11-19 NOTE — Therapy (Signed)
[facility] [address] [place], [place], [address] Phone: [phone]   Fax:  [phone]  Physical Therapy Treatment/Progress  Patient Details  Name: Sarah [name] MRN: [id number] Date of Birth: December 10, 1945 Referring Provider (PT): Frann Rider, NP    Progress Note  Reporting period 09/26/20 to 11/19/20  See Note below for Objective Data and Assessment of Progress/Goals  Encounter Date: 11/19/2020   PT End of Session - 11/19/20 1152     Visit Number 10    Number of Visits 17    Date for PT Re-Evaluation 11/21/20    Authorization Type [employer] Medicare - 10th visit PN    PT Start Time 1150    PT Stop Time 1228    PT Time Calculation (min) 38 min    Equipment Utilized During Treatment Gait belt    Activity Tolerance Patient tolerated treatment well    Behavior During Therapy [facility] for tasks assessed/performed             Past Medical History:  Diagnosis Date   Bilateral carpal tunnel syndrome 03/27/2019   Boils    Glaucoma    Heart murmur    HTN (hypertension)    Hx of adenomatous colonic polyps    Hyperlipidemia    Osteoporosis    Pneumonia    Stroke ([place])    Type II or unspecified type diabetes mellitus without mention of complication, uncontrolled     Past Surgical History:  Procedure Laterality Date   ABDOMINAL HYSTERECTOMY     CARPAL TUNNEL RELEASE     right   COLONOSCOPY  12-10-10   per Dr. Deatra Ina, clear, repeat in 7 yrs    CORONARY STENT INTERVENTION N/A 03/11/2020   Procedure: CORONARY STENT INTERVENTION;  Surgeon: Jettie Booze, MD;  Location: [facility];  Service: Cardiovascular;  Laterality: N/A;   INCISION AND DRAINAGE PERIRECTAL ABSCESS N/A 09/26/2015   Procedure: IRRIGATION AND DEBRIDEMENT PERIRECTAL ABSCESS;  Surgeon: Mickeal Skinner, MD;  Location: [facility];  Service: General;  Laterality: N/A;   INTRAVASCULAR ULTRASOUND/IVUS N/A 03/11/2020   Procedure: Intravascular  Ultrasound/IVUS;  Surgeon: Jettie Booze, MD;  Location: [facility];  Service: Cardiovascular;  Laterality: N/A;   KNEE ARTHROSCOPY     right   LEFT HEART CATH AND CORONARY ANGIOGRAPHY N/A 03/11/2020   Procedure: LEFT HEART CATH AND CORONARY ANGIOGRAPHY;  Surgeon: Jettie Booze, MD;  Location: [facility];  Service: Cardiovascular;  Laterality: N/A;   LOOP RECORDER INSERTION N/A 10/19/2016   Procedure: LOOP RECORDER INSERTION;  Surgeon: Thompson Grayer, MD;  Location: [facility];  Service: Cardiovascular;  Laterality: N/A;   ORIF ANKLE FRACTURE Right 03/24/2012   Procedure: OPEN REDUCTION INTERNAL FIXATION (ORIF) ANKLE FRACTURE;  Surgeon: Newt Minion, MD;  Location: [facility];  Service: Orthopedics;  Laterality: Right;  Open Reduction Internal Fixation Right Bimalleolar ankle fracture   POLYPECTOMY     TEE WITHOUT CARDIOVERSION N/A 10/18/2016   Procedure: TRANSESOPHAGEAL ECHOCARDIOGRAM (TEE);  Surgeon: Fay Records, MD;  Location: [facility];  Service: Cardiovascular;  Laterality: N/A;   TONSILLECTOMY      There were no vitals filed for this visit.   Subjective Assessment - 11/19/20 1152     Subjective Pt reports that she is doing pretty good.    Currently in Pain? Yes    Pain Score 4     Pain Location Knee    Pain Orientation Left  Pain Descriptors / Indicators Sore    Pain Type Chronic pain    Pain Onset More than a month ago    Pain Frequency Intermittent                [facility] PT Assessment - 11/19/20 1153       Functional Gait  Assessment   Gait assessed  Yes    Gait Level Surface Walks 20 ft, slow speed, abnormal gait pattern, evidence for imbalance or deviates 10-15 in outside of the 12 in walkway width. Requires more than 7 sec to ambulate 20 ft.    Change in Gait Speed Able to change speed, demonstrates mild gait deviations, deviates 6-10 in outside of the 12 in walkway width, or no gait deviations, unable to achieve a major change in  velocity, or uses a change in velocity, or uses an assistive device.    Gait with Horizontal Head Turns Performs head turns smoothly with no change in gait. Deviates no more than 6 in outside 12 in walkway width    Gait with Vertical Head Turns Performs head turns with no change in gait. Deviates no more than 6 in outside 12 in walkway width.    Gait and Pivot Turn Pivot turns safely in greater than 3 sec and stops with no loss of balance, or pivot turns safely within 3 sec and stops with mild imbalance, requires small steps to catch balance.    Step Over Obstacle Is able to step over one shoe box (4.5 in total height) without changing gait speed. No evidence of imbalance.    Gait with Narrow Base of Support Ambulates 4-7 steps.    Gait with Eyes Closed Walks 20 ft, slow speed, abnormal gait pattern, evidence for imbalance, deviates 10-15 in outside 12 in walkway width. Requires more than 9 sec to ambulate 20 ft.    Ambulating Backwards Walks 20 ft, uses assistive device, slower speed, mild gait deviations, deviates 6-10 in outside 12 in walkway width.    Steps Alternating feet, must use rail.    Total Score 19                           [facility] Adult PT Treatment/Exercise - 11/19/20 1153       Ambulation/Gait   Ambulation/Gait Yes    Ambulation/Gait Assistance 5: Supervision    Ambulation Distance (Feet) 1000 Feet    Assistive device None    Gait Pattern Step-through pattern;Decreased step length - right;Decreased step length - left;Wide base of support    Ambulation Surface Level;Indoor    Gait velocity 11.05 sec=0.4ms      Neuro Re-ed    Neuro Re-ed Details  In // bars: standing on airex with feet together x 30 sec eyes closed, 30 sec x 2 eyes closed with increased sway with CGA, head turns left/right x 10 and up/down x 10. Without UE support tapping 2 different colored therastones in front on command CGA with cues to take her time as unsteady at times CGA.                      PT Education - 11/19/20 1930     Education Details Results of testing.    Person(s) Educated Patient    Methods Explanation    Comprehension Verbalized understanding              PT Short Term Goals - 10/23/20 1553  PT SHORT TERM GOAL #1   Title Pt will be independent with HEP for improved strength, balance, transfers, and gait including aquatics as needed.    Baseline 10/23/20 Pt understands current HEP. Is starting aquatic therapy next week.    Time 4    Period Weeks    Status Partially Met    Target Date 10/24/20      PT SHORT TERM GOAL #2   Title Pt will decrease 5 x sit to stand to <13 sec for improved balance and functional mobility.    Baseline 09/26/20 15.87 sec, 10/23/20 13.23 sec    Time 4    Period Weeks    Status Partially Met    Target Date 10/24/20      PT SHORT TERM GOAL #3   Title Pt will increase FGA from 16/30 to >19/30 for improved balance and gait.    Baseline 09/26/20 16/30. 10/23/20 19/30    Time 4    Period Weeks    Status Partially Met    Target Date 10/24/20               PT Long Term Goals - 11/19/20 1202       PT LONG TERM GOAL #1   Title Pt will report <4/10 pain in left knee with activities for improved function. (LTGs due 11/21/20)    Baseline 4/10. 11/19/20 3-4/10 with no increase during session.    Time 8    Period Weeks    Status Partially Met      PT LONG TERM GOAL #2   Title Pt will increase gait speed to >0.31ms for improved community ambulation.    Baseline 09/26/20 0.757m. 11/19/20 0.9056m   Time 8    Period Weeks    Status Achieved      PT LONG TERM GOAL #3   Title Patient to improve FGA to >/= 24/30 demonstrating improved dynamic balance:    Baseline 09/26/20 16/30. 11/19/20 19/30    Time 8    Period Weeks    Status Not Met      PT LONG TERM GOAL #4   Title Pt will ambulate >1000' with cane versus no AD on varied surfaces mod I for improved community mobility.    Baseline 11/4-550'  w/ no AD around building. 11/19/20 1000' on level surfaces supervision    Time 8    Period Weeks    Status On-going      PT LONG TERM GOAL #5   Title Lower Extremity Functional Scale goal TBD    Time 8    Period Weeks    Status New                   Plan - 11/19/20 1932     Clinical Impression Statement PT started checking LTGs today. Pt met gait speed goal with increase to 0.36m7mith indicates community ambulator. Pt increased gait distance on level surfaces to 1000' partially meeting gait goal but still supervision. Increased FGA to 19/30 short of goal and still indicating fall risk. Pt had no increased pain with activities today. Continues to benefit from skilled PT to continue to progress strength, balance and functional mobility.    Stability/Clinical Decision Making Stable/Uncomplicated    Rehab Potential Good    PT Frequency 2x / week    PT Duration 8 weeks    PT Treatment/Interventions ADLs/Self Care Home Management;DME Instruction;Gait training;Functional mobility training;Therapeutic activities;Therapeutic exercise;Balance training;Neuromuscular re-education;Patient/family education;Aquatic Therapy;Manual techniques;Cryotherapy;Moist Heat;Passive range of  motion    PT Next Visit Plan Check last goal and recert 2x/week for about 4 weeks depending if they were able to get on pool schedule soon. continue with LE strengthening, specifically left hip. balance training being mindful of pain. Work on standing on compliant surfaces.    PT Home Exercise Plan Access Code: [email or web address]             Patient will benefit from skilled therapeutic intervention in order to improve the following deficits and impairments:  Abnormal gait, Difficulty walking, Decreased balance, Decreased mobility, Decreased strength, Pain  Visit Diagnosis: Other abnormalities of gait and mobility  Muscle weakness (generalized)  Unsteadiness on feet     Problem List Patient Active Problem List    Diagnosis Date Noted   Coronary artery disease of native artery of native heart with stable angina pectoris ([place]) 03/04/2020   Diabetes mellitus ([place]) 03/03/2020   Aortic atherosclerosis ([place]) 02/12/2020   Bilateral leg cramps 01/21/2020   Bilateral lower extremity edema 01/21/2020   Anxiety disorder 01/21/2020   Type 2 diabetes mellitus with hyperglycemia, with long-term current use of insulin ([place]) 08/15/2019   Type 2 diabetes mellitus with diabetic polyneuropathy, with long-term current use of insulin ([place]) 08/15/2019   Type 2 diabetes mellitus with microalbuminuria, with long-term current use of insulin ([place]) 08/15/2019   Hyperlipidemia LDL goal <70 08/15/2019   Bilateral carpal tunnel syndrome 03/27/2019   Osteoarthritis of both knees 11/27/2018   CKD (chronic kidney disease), stage II 11/27/2018   GERD (gastroesophageal reflux disease) 06/26/2018   Homonymous hemianopia, left 02/08/2018   Diabetes mellitus with coincident hypertension (HCC)    Trigger finger, right index finger 10/17/2017   Low back pain 07/29/2017   Carotid artery stenosis 11/02/2016   Left arm weakness    Diastolic dysfunction    Hypertension with heart disease    Acute blood loss anemia    Cerebral infarction due to embolism of right middle cerebral artery ([place])    Perirectal abscess 09/26/2015   Vitamin D deficiency 07/23/2015   Osteopenia 02/16/2013   Ankle fracture 01/19/2013   Anemia 09/27/2011   Type II diabetes mellitus with nephropathy ([place]) 09/24/2011   Mixed hyperlipidemia 09/24/2011    Electa Sniff, PT, DPT, NCS 11/19/2020, 7:39 PM  [facility] [address] [place], [place], [address] Phone: [phone]   Fax:  [phone]  Name: Sarah [name] MRN: [id number] Date of Birth: [birth year]

## 2020-11-19 NOTE — Addendum Note (Signed)
Addended by: [name], Seymone Forlenza G on: 11/19/2020 09:17 AM   Modules accepted: Orders

## 2020-11-21 ENCOUNTER — Other Ambulatory Visit: Payer: Self-pay

## 2020-11-21 ENCOUNTER — Ambulatory Visit: Payer: Medicare Other

## 2020-11-21 ENCOUNTER — Encounter: Payer: Self-pay | Admitting: Family Medicine

## 2020-11-21 ENCOUNTER — Ambulatory Visit: Payer: Medicare Other | Admitting: Internal Medicine

## 2020-11-21 MED ORDER — DAPAGLIFLOZIN PROPANEDIOL 10 MG PO TABS: 10.0000 mg | ORAL_TABLET | Freq: Every day | ORAL | 3 refills | Status: DC

## 2020-11-21 NOTE — Progress Notes (Deleted)
Name: Sarah [name]  Age/ Sex: 75 y.o., female   MRN/ DOB: [id number], [birth year]     PCP: [name], [name], MD   Reason for Endocrinology Evaluation: Type 2 Diabetes Mellitus  Initial Endocrine Consultative Visit: 08/15/2019    PATIENT IDENTIFIER: [name] is a 75 y.o. female with a past medical history of T2DM, CVA, Dyslipidemia  And CAD (S/P PCI 03/2020). The patient has followed with Endocrinology clinic since 08/15/2019 for consultative assistance with management of Sarah [name] diabetes.  DIABETIC HISTORY:  Sarah [name] was diagnosed with DM many years ago. Sarah [name] hemoglobin A1c has ranged from 10.3% in 2020, peaking at 16.3% in 2016.   On Sarah [name] initial visit to our clinic she had an A1c of 12.5 % She was on MDI regimen but was not taking Metformin due to GI side effects so we stopped it. We adjusted MDI regimen and continued Victoza    Started Farxiga 02/2020    Lives with mother who is 24 yrs ago  SUBJECTIVE:   During the last visit (06/13/2020): A1c 8.9 %  continued victoza and adjusted MDI regimen , increased  farxiga       Today (11/21/2020): Sarah [name] is here for a follow up on diabetes management.  She checks Sarah [name] blood sugars 2 times weekly . The patient has not had hypoglycemic episodes since the last clinic visit  [name] Glade has been held by cardiology due to frequency 09/2020 Continues with speech therapy    HOME DIABETES REGIMEN:  Victoza 1.8 mg daily  Lantus  64 units daily  Humalog 14 units with Breakfast and Lunch and 18 units with supper Farxiga 10 mg daily - on hold     Statin: yes ACE-I/ARB: yes     CONTINUOUS GLUCOSE MONITORING RECORD INTERPRETATION    Dates of Recording: 5/21-06/13/2020  Sensor description: Dexcom  Results statistics:   CGM use % of time 100  Average and SD 219/63  Time in range 20 %  % Time Above 180 52  % Time above 250 27  % Time Below target <1     Glycemic patterns summary: Hyperglycemia all day and  night, but worse during the day   Hyperglycemic episodes  All day and night   Hypoglycemic episodes occurred during the day , post bolus   Overnight periods: trends down but remains high      DIABETIC COMPLICATIONS: Microvascular complications:  Retinopathy, neuropathy  Denies: CKD Last Eye Exam: Completed 02/2019  Macrovascular complications:  CVA, CAD (S/P PCI 03/2020 Denies: PVD   HISTORY:  Past Medical History:  Past Medical History:  Diagnosis Date   Bilateral carpal tunnel syndrome 03/27/2019   Boils    Glaucoma    Heart murmur    HTN (hypertension)    Hx of adenomatous colonic polyps    Hyperlipidemia    Osteoporosis    Pneumonia    Stroke ([place])    Type II or unspecified type diabetes mellitus without mention of complication, uncontrolled    Past Surgical History:  Past Surgical History:  Procedure Laterality Date   ABDOMINAL HYSTERECTOMY     CARPAL TUNNEL RELEASE     right   COLONOSCOPY  12-10-10   per Dr. Deatra Ina, clear, repeat in 7 yrs    CORONARY STENT INTERVENTION N/A 03/11/2020   Procedure: CORONARY STENT INTERVENTION;  Surgeon: Jettie Booze, MD;  Location: [facility];  Service: Cardiovascular;  Laterality: N/A;   INCISION AND DRAINAGE PERIRECTAL ABSCESS N/A 09/26/2015  Procedure: IRRIGATION AND DEBRIDEMENT PERIRECTAL ABSCESS;  Surgeon: Mickeal Skinner, MD;  Location: [facility];  Service: General;  Laterality: N/A;   INTRAVASCULAR ULTRASOUND/IVUS N/A 03/11/2020   Procedure: Intravascular Ultrasound/IVUS;  Surgeon: Jettie Booze, MD;  Location: [facility];  Service: Cardiovascular;  Laterality: N/A;   KNEE ARTHROSCOPY     right   LEFT HEART CATH AND CORONARY ANGIOGRAPHY N/A 03/11/2020   Procedure: LEFT HEART CATH AND CORONARY ANGIOGRAPHY;  Surgeon: Jettie Booze, MD;  Location: [facility];  Service: Cardiovascular;  Laterality: N/A;   LOOP RECORDER INSERTION N/A 10/19/2016   Procedure: LOOP RECORDER INSERTION;   Surgeon: Thompson Grayer, MD;  Location: [facility];  Service: Cardiovascular;  Laterality: N/A;   ORIF ANKLE FRACTURE Right 03/24/2012   Procedure: OPEN REDUCTION INTERNAL FIXATION (ORIF) ANKLE FRACTURE;  Surgeon: Newt Minion, MD;  Location: [facility];  Service: Orthopedics;  Laterality: Right;  Open Reduction Internal Fixation Right Bimalleolar ankle fracture   POLYPECTOMY     TEE WITHOUT CARDIOVERSION N/A 10/18/2016   Procedure: TRANSESOPHAGEAL ECHOCARDIOGRAM (TEE);  Surgeon: Fay Records, MD;  Location: [facility];  Service: Cardiovascular;  Laterality: N/A;   TONSILLECTOMY     Social History:  reports that she quit smoking about 4 years ago. Sarah [name] smoking use included cigarettes. She has never used smokeless tobacco. She reports current alcohol use. She reports that she does not use drugs. Family History:  Family History  Problem Relation Age of Onset   Diabetes Mother    Hypertension Mother    Stroke Father    Stroke Maternal Uncle    Stroke Paternal Uncle    Colon cancer Neg Hx    Esophageal cancer Neg Hx    Rectal cancer Neg Hx    Stomach cancer Neg Hx      HOME MEDICATIONS: Allergies as of 11/21/2020   No Known Allergies      Medication List        Accurate as of November 21, 2020  7:27 AM. If you have any questions, ask your nurse or doctor.          acetaminophen 500 MG tablet Commonly known as: TYLENOL Take 1,000 mg by mouth every 6 (six) hours as needed for mild pain or headache.   amLODipine 10 MG tablet Commonly known as: NORVASC TAKE 1 TABLET(10 MG) BY MOUTH DAILY   aspirin EC 81 MG tablet Swallow whole. TAKE daily until after heart Cath What changed:  how much to take how to take this when to take this additional instructions   atorvastatin 80 MG tablet Commonly known as: LIPITOR TAKE 1 TABLET(80 MG) BY MOUTH DAILY AT 6 PM   calcium carbonate 750 MG chewable tablet Commonly known as: TUMS EX Chew 1 tablet by mouth as needed for  heartburn.   clopidogrel 75 MG tablet Commonly known as: PLAVIX Take 1 tablet (75 mg total) by mouth daily.   dapagliflozin propanediol 10 MG Tabs tablet Commonly known as: Farxiga Take 1 tablet (10 mg total) by mouth daily.   Dexcom G6 Sensor Misc 1 Device by Does not apply route as directed.   Dexcom G6 Transmitter Misc 1 Device by Does not apply route as directed.   diclofenac Sodium 1 % Gel Commonly known as: Voltaren Apply 2 g topically 4 (four) times daily.   ezetimibe 10 MG tablet Commonly known as: ZETIA TAKE 1 TABLET(10 MG) BY MOUTH DAILY   furosemide 20 MG tablet Commonly known  as: LASIX TAKE 1 TABLET(20 MG) BY MOUTH DAILY   HumaLOG KwikPen 200 UNIT/ML KwikPen Generic drug: insulin lispro Inject 14 Units into the skin 2 (two) times daily with breakfast and lunch AND 18 Units daily with supper. 15 U 15 min before meals.   Insulin Pen Needle 32G X 4 MM Misc 1 Device by Does not apply route in the morning, at noon, in the evening, and at bedtime.   Lantus SoloStar 100 UNIT/ML Solostar Pen Generic drug: insulin glargine Inject 64 Units into the skin daily.   losartan 50 MG tablet Commonly known as: COZAAR TAKE 1 TABLET(50 MG) BY MOUTH DAILY   metoprolol tartrate 25 MG tablet Commonly known as: LOPRESSOR Take 1 tablet (25 mg total) by mouth 2 (two) times daily.   nitroGLYCERIN 0.4 MG SL tablet Commonly known as: NITROSTAT Place 1 tablet (0.4 mg total) under the tongue every 5 (five) minutes as needed for chest pain. one tab every 5 minutes up to 3 tablets total over 15 minutes.   ONE TOUCH ULTRA TEST test strip Generic drug: glucose blood Check BS before and 2 hours after meals.   OT ULTRA/FASTTK CNTRL SOLN Soln   Pharmacist Choice Alcohol 70 % Misc Generic drug: Isopropyl Alcohol   [place] 0.02-0.005 % Soln Generic drug: Netarsudil-Latanoprost Place 1 drop into both eyes every evening.   sertraline 50 MG tablet Commonly known as: ZOLOFT Take 1  tablet (50 mg total) by mouth daily.   Simple Diagnostics Lancing Dev Misc Check BS before and 2 hours after meals.   Victoza 18 MG/3ML Sopn Generic drug: liraglutide Inject 1.8 mg into the skin daily.         OBJECTIVE:   Vital Signs: There were no vitals taken for this visit.  Wt Readings from Last 3 Encounters:  11/17/20 142 lb 2 oz (64.5 kg)  09/22/20 139 lb (63 kg)  09/11/20 141 lb (64 kg)     Exam: General: Pt appears well and is in NAD  Lungs: Clear with good BS bilat with no rales, rhonchi, or wheezes  Heart: RRR   Extremities: No pretibial edema.   Neuro: MS is good with appropriate affect, pt is alert and Ox3   DM foot exam: 8//04/2019   The skin of the feet is intact without sores or ulcerations. The pedal pulses are 1+ on right and 1+ on left. The sensation is decreased  to a screening 5.07, 10 gram monofilament bilaterally    DATA REVIEWED:  Lab Results  Component Value Date   HGBA1C 8.9 (A) 06/13/2020   HGBA1C 14.3 (A) 03/03/2020   HGBA1C 12.5 (H) 08/01/2019   Results for BETHENE, HANKINSON (MRN [id number]) as of 06/13/2020 15:46  Ref. Range 03/04/2020 15:40  BASIC METABOLIC PANEL Unknown Rpt (A)  Sodium Latest Ref Range: 134 - 144 mmol/L 137  Potassium Latest Ref Range: 3.5 - 5.2 mmol/L 4.2  Chloride Latest Ref Range: 96 - 106 mmol/L 102  CO2 Latest Ref Range: 20 - 29 mmol/L 21  Glucose Latest Ref Range: 65 - 99 mg/dL 375 (H)  BUN Latest Ref Range: 8 - 27 mg/dL 16  Creatinine Latest Ref Range: 0.57 - 1.00 mg/dL 0.65  Calcium Latest Ref Range: 8.7 - 10.3 mg/dL 9.7  BUN/Creatinine Ratio Latest Ref Range: 12 - 28  25    ASSESSMENT / PLAN / RECOMMENDATIONS:   1) Type 2 Diabetes Mellitus, Poorly controlled, With neuropathic and  macrovascular  complications - Most recent A1c of 8.9 %.  Goal A1c < 7.0 %.    - A1c dow from 14.3% I have encouraged Sarah [name] to continue with glucose check and medication compliance  - She is liking Dexcom  - Will make  the following changes   MEDICATIONS: - Increase Farxiga to 10 mg, 1 tablet daily  - Continue Victoza 1.8 mg daily  - Increase Lantus to 64 units daily  - Change  Humalog to 14 units with Breakfast, 14 units with Lunch and 18 units with Supper    EDUCATION / INSTRUCTIONS: BG monitoring instructions: Patient is instructed to check Sarah [name] blood sugars 3 times a day, before meals  Call [facility] if: BG persistently < 70 I reviewed the Rule of 15 for the treatment of hypoglycemia in detail with the patient. Literature supplied.   2) Diabetic complications:  Eye: there's a questionable diabetic retinopathy from Sarah [name] history Neuro/ Feet: Does have known diabetic peripheral neuropathy .  Renal: Patient does not have known baseline CKD. She   is  on an ACEI/ARB at present.       F/U in 4 months    Signed electronically by: Mack Guise, MD  [facility] Endocrinology  [facility] [address] [place], [place] [address] Phone: [phone] FAX: [phone]   CC: [name], [name], [place] [address] Phone: [phone]  Fax: [phone]  Return to Endocrinology clinic as below: Future Appointments  Date Time Provider [place]  11/21/2020  9:10 AM Darius Fillingim, Melanie Crazier, MD [facility] None  11/21/2020 11:00 AM Sharen Counter, CCC-SLP [facility]  11/21/2020 12:30 PM Electa Sniff, PT [facility]  11/26/2020 12:30 PM Lovvorn, Hewitt Shorts, CCC-SLP [facility]  11/27/2020  8:45 AM Arliss Journey, PT [facility]  11/28/2020 12:30 PM Alinda Deem, CCC-SLP [facility]  12/01/2020  8:15 AM Vedia Pereyra, PT [facility]  12/03/2020 11:00 AM Alinda Deem, CCC-SLP [facility]  12/10/2020 12:30 PM Lovvorn, Hewitt Shorts, CCC-SLP [facility]  12/11/2020  4:15 PM Vedia Pereyra, PT [facility]  12/12/2020  1:15 PM Alinda Deem, CCC-SLP [facility]   12/12/2020  2:00 PM Arliss Journey, PT [facility]  12/16/2020  9:50 AM Mansouraty, Telford Nab., MD [facility]  12/17/2020 12:30 PM Alinda Deem, CCC-SLP [facility]  12/17/2020  4:30 PM Vedia Pereyra, PT [facility]  12/18/2020 10:15 AM Electa Sniff, PT [facility]  12/19/2020  2:00 PM Alinda Deem, CCC-SLP [facility]  12/24/2020 11:15 AM Vedia Pereyra, PT [facility]  12/26/2020  7:30 AM [facility]  12/26/2020 12:30 PM Arliss Journey, PT [facility]  12/31/2020 12:30 PM Arliss Journey, PT [facility]  03/23/2021  9:40 AM Werner Lean, MD [facility]  04/17/2021 10:00 AM [name], [name], MD [facility]

## 2020-11-26 ENCOUNTER — Ambulatory Visit: Payer: Medicare Other | Admitting: Speech Pathology

## 2020-11-27 ENCOUNTER — Ambulatory Visit: Payer: Medicare Other | Admitting: Physical Therapy

## 2020-11-27 DIAGNOSIS — Z1231 Encounter for screening mammogram for malignant neoplasm of breast: Secondary | ICD-10-CM | POA: Diagnosis not present

## 2020-11-27 LAB — HM MAMMOGRAPHY

## 2020-11-28 ENCOUNTER — Ambulatory Visit: Payer: Medicare Other

## 2020-12-01 ENCOUNTER — Ambulatory Visit (HOSPITAL_BASED_OUTPATIENT_CLINIC_OR_DEPARTMENT_OTHER): Payer: Medicare Other | Admitting: Physical Therapy

## 2020-12-01 ENCOUNTER — Encounter: Payer: Self-pay | Admitting: Family Medicine

## 2020-12-03 ENCOUNTER — Ambulatory Visit: Payer: Medicare Other

## 2020-12-10 ENCOUNTER — Encounter: Payer: Medicare Other | Admitting: Speech Pathology

## 2020-12-11 ENCOUNTER — Ambulatory Visit (HOSPITAL_BASED_OUTPATIENT_CLINIC_OR_DEPARTMENT_OTHER): Payer: Medicare Other | Admitting: Physical Therapy

## 2020-12-11 NOTE — Therapy (Signed)
[facility] [address] [place], [place], [address] Phone: [phone]   Fax:  [phone]  Patient Details  Name: Sarah [name] MRN: [id number] Date of Birth: 05/12/1945 Referring Provider:  No ref. provider found  Encounter Date: 12/11/2020  PHYSICAL THERAPY DISCHARGE SUMMARY- non visit d/c summary  Visits from Start of Care: 10  Current functional level related to goals / functional outcomes: Pt cancelled remaining visits due to having to be home to care for her mother. Here is clinical impression from last visit on 11/20/20: PT started checking LTGs today. Pt met gait speed goal with increase to 0.21ms with indicates community ambulator. Pt increased gait distance on level surfaces to 1000' partially meeting gait goal but still supervision. Increased FGA to 19/30 short of goal and still indicating fall risk. Pt had no increased pain with activities today.  Goals assessed from last session listed below. Remaining deficits: Balance, strength   Education / Equipment: HEP   Patient agrees to discharge. Patient goals were not met. Patient is being discharged due to the patient's request.  PT Short Term Goals - 10/23/20 1553       PT SHORT TERM GOAL #1   Title Pt will be independent with HEP for improved strength, balance, transfers, and gait including aquatics as needed.    Baseline 10/23/20 Pt understands current HEP. Is starting aquatic therapy next week.    Time 4    Period Weeks    Status Partially Met    Target Date 10/24/20      PT SHORT TERM GOAL #2   Title Pt will decrease 5 x sit to stand to <13 sec for improved balance and functional mobility.    Baseline 09/26/20 15.87 sec, 10/23/20 13.23 sec    Time 4    Period Weeks    Status Partially Met    Target Date 10/24/20      PT SHORT TERM GOAL #3   Title Pt will increase FGA from 16/30 to >19/30 for improved balance and gait.    Baseline 09/26/20  16/30. 10/23/20 19/30    Time 4    Period Weeks    Status Partially Met    Target Date 10/24/20             PT Long Term Goals - 11/19/20 1202       PT LONG TERM GOAL #1   Title Pt will report <4/10 pain in left knee with activities for improved function. (LTGs due 11/21/20)    Baseline 4/10. 11/19/20 3-4/10 with no increase during session.    Time 8    Period Weeks    Status Partially Met      PT LONG TERM GOAL #2   Title Pt will increase gait speed to >0.853m for improved community ambulation.    Baseline 09/26/20 0.7162m 11/19/20 0.59m79m  Time 8    Period Weeks    Status Achieved      PT LONG TERM GOAL #3   Title Patient to improve FGA to >/= 24/30 demonstrating improved dynamic balance:    Baseline 09/26/20 16/30. 11/19/20 19/30    Time 8    Period Weeks    Status Not Met      PT LONG TERM GOAL #4   Title Pt will ambulate >1000' with cane versus no AD on varied surfaces mod I for improved community mobility.    Baseline 11/4-550' w/ no AD around building. 11/19/20 1000' on level  surfaces supervision    Time 8    Period Weeks    Status On-going      PT LONG TERM GOAL #5   Title Lower Extremity Functional Scale goal TBD    Time 8    Period Weeks    Status New              Electa Sniff, PT, DPT, NCS 12/11/2020, 3:31 PM  [facility] [address] [place], [place], [address] Phone: [phone]   Fax:  [phone]

## 2020-12-12 ENCOUNTER — Ambulatory Visit: Payer: Medicare Other | Admitting: Physical Therapy

## 2020-12-12 ENCOUNTER — Ambulatory Visit: Payer: Medicare Other

## 2020-12-15 ENCOUNTER — Other Ambulatory Visit: Payer: Self-pay | Admitting: Family Medicine

## 2020-12-16 ENCOUNTER — Encounter: Payer: Self-pay | Admitting: Gastroenterology

## 2020-12-16 ENCOUNTER — Telehealth: Payer: Self-pay

## 2020-12-16 ENCOUNTER — Ambulatory Visit: Payer: Medicare Other | Admitting: Gastroenterology

## 2020-12-16 VITALS — BP 150/70 | HR 80 | Ht 60.0 in | Wt 139.0 lb

## 2020-12-16 DIAGNOSIS — Z860101 Personal history of adenomatous and serrated colon polyps: Secondary | ICD-10-CM | POA: Insufficient documentation

## 2020-12-16 DIAGNOSIS — Z1211 Encounter for screening for malignant neoplasm of colon: Secondary | ICD-10-CM

## 2020-12-16 DIAGNOSIS — K5909 Other constipation: Secondary | ICD-10-CM

## 2020-12-16 DIAGNOSIS — Z7902 Long term (current) use of antithrombotics/antiplatelets: Secondary | ICD-10-CM

## 2020-12-16 DIAGNOSIS — Z86010 Personal history of colonic polyps: Secondary | ICD-10-CM

## 2020-12-16 MED ORDER — NA SULFATE-K SULFATE-MG SULF 17.5-3.13-1.6 GM/177ML PO SOLN: 1.0000 | ORAL | 0 refills | Status: DC

## 2020-12-16 NOTE — Patient Instructions (Signed)
We have sent the following medications to your pharmacy for you to pick up at your convenience: Williams will be contaced by our office prior to your procedure for directions on holding your Plavix.  If you do not hear from our office 1 week prior to your scheduled procedure, please call [phone] to discuss.  You have been scheduled for a colonoscopy. Please follow written instructions given to you at your visit today.  Please pick up your prep supplies at the pharmacy within the next 1-3 days. If you use inhalers (even only as needed), please bring them with you on the day of your procedure.  If you are age 42 or older, your body mass index should be between 23-30. Your Body mass index is 27.15 kg/m. If this is out of the aforementioned range listed, please consider follow up with your Primary Care Provider.  If you are age 62 or younger, your body mass index should be between 19-25. Your Body mass index is 27.15 kg/m. If this is out of the aformentioned range listed, please consider follow up with your Primary Care Provider.   ________________________________________________________  The [facility] providers would like to encourage you to use [facility] to communicate with providers for non-urgent requests or questions.  Due to long hold times on the telephone, sending your provider a message by [facility] may be a faster and more efficient way to get a response.  Please allow 48 business hours for a response.  Please remember that this is for non-urgent requests.  _______________________________________________________  Thank you for choosing me and [facility].  Dr. Rush Landmark

## 2020-12-16 NOTE — Progress Notes (Signed)
[facility] VISIT   Primary Care Provider [name], [name], MD [place] [address] [phone]  Referring Provider [name], [name], [place],  [place] [address] [phone]  Patient Profile: Sarah [name] is a 75 y.o. female with a pmh significant for prior CVA (on Plavix), CAD (status post PCI in 3/22-remains on Plavix), hypertension, hyperlipidemia, diabetes, colon polyps (history TAs).  The patient presents to the [facility] for an evaluation and management of problem(s) noted below:  Problem List 1. History of adenomatous polyp of colon   2. Colon cancer screening   3. Chronic constipation   4. Long term (current) use of antithrombotics/antiplatelets     History of Present Illness This is the patient's first visit to the outpatient [facility] clinic in years.  In 2012 she underwent her last colonoscopy by Dr. Deatra Ina.  No polyps were found at that time but 5 years earlier in 2007 she had a polyp removed that was adenomatous.  She was recommended a 7-year follow-up to be done in 2019 but she never followed through with this.  Patient states she has longstanding constipation issues using the restroom on an every other day basis.  She has not concerned about this because this there have been her bowel habits for years.  She uses laxative therapy very infrequently.  Patient has not noted any blood in her stools.  She is not having any abdominal pain or discomfort.  Earlier this year she underwent a PCI intervention and has remained on Plavix as well since her stroke in 2018.  She has done well.  She is going to be coming off her Plavix for upcoming dental intervention in the course of the next few weeks.  The patient does not take significant NSAIDs or other BC/Goody powders.  She has never had an upper endoscopy.  GI Review of Systems Positive as above Negative for pyrosis, dysphagia,  odynophagia, nausea, vomiting, abdominal pain, alteration of bowel habits, melena, hematochezia  Review of Systems General: Denies fevers/chills/weight loss unintentionally HEENT: Denies oral lesions Cardiovascular: Denies chest pain/palpitations Pulmonary: Denies shortness of breath Gastroenterological: See HPI Genitourinary: Denies darkened urine or hematuria Hematological: Positive for history of easy bruising/bleeding due to Plavix Dermatological: Denies jaundice Psychological: Mood is stable   Medications Current Outpatient Medications  Medication Sig Dispense Refill   acetaminophen (TYLENOL) 500 MG tablet Take 1,000 mg by mouth every 6 (six) hours as needed for mild pain or headache.     amLODipine (NORVASC) 10 MG tablet TAKE 1 TABLET(10 MG) BY MOUTH DAILY 90 tablet 2   aspirin EC 81 MG tablet Swallow whole. TAKE daily until after heart Cath (Patient taking differently: Take 81 mg by mouth daily.) 30 tablet 11   atorvastatin (LIPITOR) 80 MG tablet TAKE 1 TABLET(80 MG) BY MOUTH DAILY AT 6 PM 90 tablet 2   Blood Glucose Calibration (OT ULTRA/FASTTK CNTRL SOLN) SOLN      calcium carbonate (TUMS EX) 750 MG chewable tablet Chew 1 tablet by mouth as needed for heartburn.     clopidogrel (PLAVIX) 75 MG tablet TAKE 1 TABLET(75 MG) BY MOUTH DAILY 90 tablet 1   Continuous Blood Gluc Sensor (DEXCOM G6 SENSOR) MISC 1 Device by Does not apply route as directed. 3 each 11   Continuous Blood Gluc Transmit (DEXCOM G6 TRANSMITTER) MISC 1 Device by Does not apply route as directed. 1 each 3   dapagliflozin propanediol (FARXIGA) 10 MG TABS tablet  Take 1 tablet (10 mg total) by mouth daily. 90 tablet 3   diclofenac Sodium (VOLTAREN) 1 % GEL Apply 2 g topically 4 (four) times daily. 150 g 3   ezetimibe (ZETIA) 10 MG tablet TAKE 1 TABLET(10 MG) BY MOUTH DAILY 90 tablet 3   furosemide (LASIX) 20 MG tablet TAKE 1 TABLET(20 MG) BY MOUTH DAILY 30 tablet 2   insulin glargine (LANTUS SOLOSTAR) 100 UNIT/ML  Solostar Pen Inject 64 Units into the skin daily. 60 mL 3   insulin lispro (HUMALOG KWIKPEN) 200 UNIT/ML KwikPen Inject 14 Units into the skin 2 (two) times daily with breakfast and lunch AND 18 Units daily with supper. 15 U 15 min before meals. 30 mL 3   Insulin Pen Needle 32G X 4 MM MISC 1 Device by Does not apply route in the morning, at noon, in the evening, and at bedtime. 400 each 3   Lancet Devices (SIMPLE DIAGNOSTICS LANCING DEV) MISC Check BS before and 2 hours after meals. 200 each 6   liraglutide (VICTOZA) 18 MG/3ML SOPN Inject 1.8 mg into the skin daily. 9 mL 3   losartan (COZAAR) 50 MG tablet TAKE 1 TABLET(50 MG) BY MOUTH DAILY 90 tablet 1   metoprolol tartrate (LOPRESSOR) 25 MG tablet Take 1 tablet (25 mg total) by mouth 2 (two) times daily. 180 tablet 3   Na Sulfate-K Sulfate-Mg Sulf (SUPREP BOWEL PREP KIT) 17.5-3.13-1.6 GM/177ML SOLN Take 1 kit by mouth as directed. For colonoscopy prep 354 mL 0   nitroGLYCERIN (NITROSTAT) 0.4 MG SL tablet Place 1 tablet (0.4 mg total) under the tongue every 5 (five) minutes as needed for chest pain. one tab every 5 minutes up to 3 tablets total over 15 minutes. 25 tablet 3   ONE TOUCH ULTRA TEST test strip Check BS before and 2 hours after meals. 200 each 6   PHARMACIST CHOICE ALCOHOL 70 % PADS      ROCKLATAN 0.02-0.005 % SOLN Place 1 drop into both eyes every evening.     sertraline (ZOLOFT) 50 MG tablet Take 1 tablet (50 mg total) by mouth daily. 90 tablet 1   No current facility-administered medications for this visit.    Allergies No Known Allergies  Histories Past Medical History:  Diagnosis Date   Bilateral carpal tunnel syndrome 03/27/2019   Boils    Glaucoma    Heart murmur    HTN (hypertension)    Hx of adenomatous colonic polyps    Hyperlipidemia    Osteoporosis    Pneumonia    Stroke (HCC)    Type II or unspecified type diabetes mellitus without mention of complication, uncontrolled    Past Surgical History:  Procedure  Laterality Date   ABDOMINAL HYSTERECTOMY     CARPAL TUNNEL RELEASE     right   COLONOSCOPY  12-10-10   per Dr. Arlyce Dice, clear, repeat in 7 yrs    CORONARY STENT INTERVENTION N/A 03/11/2020   Procedure: CORONARY STENT INTERVENTION;  Surgeon: Corky Crafts, MD;  Location: [facility] INVASIVE CV LAB;  Service: Cardiovascular;  Laterality: N/A;   INCISION AND DRAINAGE PERIRECTAL ABSCESS N/A 09/26/2015   Procedure: IRRIGATION AND DEBRIDEMENT PERIRECTAL ABSCESS;  Surgeon: Rodman Pickle, MD;  Location: [facility] OR;  Service: General;  Laterality: N/A;   INTRAVASCULAR ULTRASOUND/IVUS N/A 03/11/2020   Procedure: Intravascular Ultrasound/IVUS;  Surgeon: Corky Crafts, MD;  Location: [facility] INVASIVE CV LAB;  Service: Cardiovascular;  Laterality: N/A;   KNEE ARTHROSCOPY     right  LEFT HEART CATH AND CORONARY ANGIOGRAPHY N/A 03/11/2020   Procedure: LEFT HEART CATH AND CORONARY ANGIOGRAPHY;  Surgeon: Jettie Booze, MD;  Location: [facility] CV LAB;  Service: Cardiovascular;  Laterality: N/A;   LOOP RECORDER INSERTION N/A 10/19/2016   Procedure: LOOP RECORDER INSERTION;  Surgeon: Thompson Grayer, MD;  Location: [facility] CV LAB;  Service: Cardiovascular;  Laterality: N/A;   ORIF ANKLE FRACTURE Right 03/24/2012   Procedure: OPEN REDUCTION INTERNAL FIXATION (ORIF) ANKLE FRACTURE;  Surgeon: Newt Minion, MD;  Location: [facility];  Service: Orthopedics;  Laterality: Right;  Open Reduction Internal Fixation Right Bimalleolar ankle fracture   POLYPECTOMY     TEE WITHOUT CARDIOVERSION N/A 10/18/2016   Procedure: TRANSESOPHAGEAL ECHOCARDIOGRAM (TEE);  Surgeon: Fay Records, MD;  Location: [facility] ENDOSCOPY;  Service: Cardiovascular;  Laterality: N/A;   TONSILLECTOMY     Social History   Socioeconomic History   Marital status: Single    Spouse name: Not on file   Number of children: Not on file   Years of education: Not on file   Highest education level: Not on file  Occupational History   Not on file  Tobacco  Use   Smoking status: Former    Types: Cigarettes    Quit date: 10/15/2016    Years since quitting: 4.1   Smokeless tobacco: Never   Tobacco comments:    smokes occ.   Vaping Use   Vaping Use: Never used  Substance and Sexual Activity   Alcohol use: Yes    Alcohol/week: 0.0 standard drinks    Comment: occ   Drug use: No   Sexual activity: Not on file  Other Topics Concern   Not on file  Social History Narrative   Not on file   Social Determinants of Health   Financial Resource Strain: Low Risk    Difficulty of Paying Living Expenses: Not hard at all  Food Insecurity: No Food Insecurity   Worried About Charity fundraiser in the Last Year: Never true   [place] in the Last Year: Never true  Transportation Needs: No Transportation Needs   Lack of Transportation (Medical): No   Lack of Transportation (Non-Medical): No  Physical Activity: Inactive   Days of Exercise per Week: 0 days   Minutes of Exercise per Session: 0 min  Stress: No Stress Concern Present   Feeling of Stress : Not at all  Social Connections: Socially Isolated   Frequency of Communication with Friends and Family: More than three times a week   Frequency of Social Gatherings with Friends and Family: More than three times a week   Attends Religious Services: Never   Marine scientist or Organizations: No   Attends Music therapist: Never   Marital Status: Divorced  Human resources officer Violence: Not At Risk   Fear of Current or Ex-Partner: No   Emotionally Abused: No   Physically Abused: No   Sexually Abused: No   Family History  Problem Relation Age of Onset   Diabetes Mother    Hypertension Mother    Stroke Father    Stroke Maternal Uncle    Stroke Paternal Uncle    Colon cancer Neg Hx    Esophageal cancer Neg Hx    Rectal cancer Neg Hx    Stomach cancer Neg Hx    Inflammatory bowel disease Neg Hx    Liver disease Neg Hx    Pancreatic cancer Neg Hx    I  have reviewed  her medical, social, and family history in detail and updated the electronic medical record as necessary.    PHYSICAL EXAMINATION  BP (!) 150/70   Pulse 80   Ht 5' (1.524 m)   Wt 139 lb (63 kg)   BMI 27.15 kg/m  Wt Readings from Last 3 Encounters:  12/16/20 139 lb (63 kg)  11/17/20 142 lb 2 oz (64.5 kg)  09/22/20 139 lb (63 kg)  GEN: NAD, appears stated age, doesn't appear chronically ill PSYCH: Cooperative, without pressured speech EYE: Conjunctivae pink, sclerae anicteric ENT: MMM CV: RR with systolic murmur present, no rubs or gallops  RESP: CTAB posteriorly, without wheezing GI: NABS, soft, NT/ND, without rebound or guarding, no HSM appreciated MSK/EXT: No lower extremity edema SKIN: No jaundice NEURO:  Alert & Oriented x 3, no focal deficits   REVIEW OF DATA  I reviewed the following data at the time of this encounter:  GI Procedures and Studies  2012 colonoscopy Normal colon 7-year follow-up recommended  2007 colonoscopy Single polyp resected via hot forceps Pathology consistent with adenoma  Laboratory Studies  Reviewed those in epic and care everywhere  Imaging Studies  No relevant studies to review   ASSESSMENT  Ms. Kempton is a 75 y.o. female with a pmh significant for prior CVA (on Plavix), CAD (status post PCI in 3/22-remains on Plavix), hypertension, hyperlipidemia, diabetes, colon polyps (history TAs).  The patient is seen today for evaluation and management of:  1. History of adenomatous polyp of colon   2. Colon cancer screening   3. Chronic constipation   4. Long term (current) use of antithrombotics/antiplatelets    The patient is hemodynamically stable.  Clinically she is doing well also.  She remains a candidate for colon cancer screening and colon polyp surveillance in the setting of her previous adenomatous colon polyps.  Because she has a history of adenomatous colon polyps, she does not meet criteria for typical Cologuard testing.  We  will need to get approval from cardiology for hold of Plavix for at least 5 days prior to procedure.  Although her 1 year mark will be in March 2023 it seems that she is undergoing dental work later this month and will be able to come off Plavix for 5 days per her report and per cardiology notation.  We will tentatively plan for this procedure in February/March and obtain cardiology approval for hold of Plavix.  If there is any need to continue to maintain Plavix until after March 2023 we certainly can postpone her procedure as she is not having symptoms otherwise.  The risks and benefits of endoscopic evaluation were discussed with the patient; these include but are not limited to the risk of perforation, infection, bleeding, missed lesions, lack of diagnosis, severe illness requiring hospitalization, as well as anesthesia and sedation related illnesses.  The patient and/or family is agreeable to proceed.  All patient questions were answered to the best of my ability, and the patient agrees to the aforementioned plan of action with follow-up as indicated.   PLAN  Proceed with scheduling colonoscopy for polyp surveillance Obtain approval from cardiology for Plavix hold x5 days   Orders Placed This Encounter  Procedures   Ambulatory referral to Gastroenterology    New Prescriptions   NA SULFATE-K SULFATE-MG SULF (SUPREP BOWEL PREP KIT) 17.5-3.13-1.6 GM/177ML SOLN    Take 1 kit by mouth as directed. For colonoscopy prep   Modified Medications   No medications on file  Planned Follow Up No follow-ups on file.   Total Time in Face-to-Face and in Coordination of Care for patient including independent/personal interpretation/review of prior testing, medical history, examination, medication adjustment, communicating results with the patient directly, and documentation within the EHR is 35 minutes.   Justice Britain, MD [facility] Advanced Endoscopy Office # [phone]

## 2020-12-16 NOTE — Telephone Encounter (Signed)
Request for surgical clearance:     Endoscopy Procedure  What type of surgery is being performed?     Colonoscopy  When is this surgery scheduled?     02/27/2021  What type of clearance is required ?   Pharmacy  Are there any medications that need to be held prior to surgery and how long? Plavix x5 days prior to procedure  Practice name and name of physician performing surgery?      [facility]  What is your office phone and fax number?      Phone- [phone]  [phone]  Anesthesia type (None, local, MAC, general) ?       MAC

## 2020-12-17 ENCOUNTER — Ambulatory Visit (HOSPITAL_BASED_OUTPATIENT_CLINIC_OR_DEPARTMENT_OTHER): Payer: Medicare Other | Admitting: Physical Therapy

## 2020-12-17 NOTE — Telephone Encounter (Signed)
Dr. Gasper Sells Pt has a history of CVA and CAD s/p DES to RCA on 03/11/20. We are asked to hold plavix for colonoscopy on 02/28/20. This is within 12 months from PCI. In your opinion, can she hold plavix and starting when?

## 2020-12-17 NOTE — Telephone Encounter (Signed)
   Name: Sarah [name]  DOB: [birth year]  MRN: [id number]   Primary Cardiologist: Werner Lean, MD  Chart reviewed as part of pre-operative protocol coverage. We are asked for guidance to hold plavix for colonoscopy 02/28/20.  Pt has a history of CVA and CAD s/p DES to RCA on 03/11/20.  This surgery date is within 12 months from PCI, but after the initial 6 months.   Per Dr. Gasper Sells, [place] to hold plavix in Feb 2023.   I will route this recommendation to the requesting party via Epic fax function and remove from pre-op pool. Please call with questions.  [place], PA 12/17/2020, 3:04 PM

## 2020-12-18 ENCOUNTER — Ambulatory Visit: Payer: Medicare Other

## 2020-12-22 NOTE — Telephone Encounter (Signed)
Patient has been informed okay to hold Plavix x5 days prior to procedure. Pt voiced understanding.

## 2020-12-24 ENCOUNTER — Ambulatory Visit (HOSPITAL_BASED_OUTPATIENT_CLINIC_OR_DEPARTMENT_OTHER): Payer: Medicare Other | Admitting: Physical Therapy

## 2020-12-24 DIAGNOSIS — H401131 Primary open-angle glaucoma, bilateral, mild stage: Secondary | ICD-10-CM | POA: Diagnosis not present

## 2020-12-25 ENCOUNTER — Ambulatory Visit (INDEPENDENT_AMBULATORY_CARE_PROVIDER_SITE_OTHER): Payer: Medicare Other | Admitting: Internal Medicine

## 2020-12-25 ENCOUNTER — Encounter: Payer: Self-pay | Admitting: Internal Medicine

## 2020-12-25 ENCOUNTER — Ambulatory Visit: Payer: Medicare Other | Admitting: Internal Medicine

## 2020-12-25 ENCOUNTER — Other Ambulatory Visit: Payer: Self-pay

## 2020-12-25 VITALS — BP 132/74 | HR 77 | Ht 60.0 in | Wt 136.6 lb

## 2020-12-25 DIAGNOSIS — E1142 Type 2 diabetes mellitus with diabetic polyneuropathy: Secondary | ICD-10-CM

## 2020-12-25 DIAGNOSIS — Z794 Long term (current) use of insulin: Secondary | ICD-10-CM

## 2020-12-25 DIAGNOSIS — E1165 Type 2 diabetes mellitus with hyperglycemia: Secondary | ICD-10-CM

## 2020-12-25 DIAGNOSIS — E1159 Type 2 diabetes mellitus with other circulatory complications: Secondary | ICD-10-CM

## 2020-12-25 LAB — POCT GLYCOSYLATED HEMOGLOBIN (HGB A1C): Hemoglobin A1C: 12.9 % — AB (ref 4.0–5.6)

## 2020-12-25 NOTE — Patient Instructions (Signed)
°-   Continue  Farxiga 10 mg, 1 tablet daily  - Continue Victoza 1.8 mg daily  - Increase Lantus to 70  units daily  - Increase  Humalog to 16 units with each meal  - Humalog correctional insulin: ADD extra units on insulin to your meal-time Humalog dose if your blood sugars are higher than 160. Use the scale below to help guide you:   Blood sugar before meal Number of units to inject  Less than 160 0 unit  161 -  190 1 units  191 -  220 2 units  221 -  250 3 units  251 -  280 4 units  281 -  310 5 units  311 -  340 6 units  341 -  370 7 units  371 -  400 8 units  401 - 430 9 units        HOW TO TREAT LOW BLOOD SUGARS (Blood sugar LESS THAN 70 MG/DL) Please follow the RULE OF 15 for the treatment of hypoglycemia treatment (when your (blood sugars are less than 70 mg/dL)   STEP 1: Take 15 grams of carbohydrates when your blood sugar is low, which includes:  3-4 GLUCOSE TABS  OR 3-4 OZ OF JUICE OR REGULAR SODA OR ONE TUBE OF GLUCOSE GEL    STEP 2: RECHECK blood sugar in 15 MINUTES STEP 3: If your blood sugar is still low at the 15 minute recheck --> then, go back to STEP 1 and treat AGAIN with another 15 grams of carbohydrates.

## 2020-12-25 NOTE — Progress Notes (Signed)
Name: Sarah [name]  Age/ Sex: 75 y.o., female   MRN/ DOB: [id number], [birth year]     PCP: [name], [name], MD   Reason for Endocrinology Evaluation: Type 2 Diabetes Mellitus  Initial Endocrine Consultative Visit: 08/15/2019    PATIENT IDENTIFIER: [name] is a 76 y.o. female with a past medical history of T2DM, CVA, Dyslipidemia  And CAD (S/P PCI 03/2020). The patient has followed with Endocrinology clinic since 08/15/2019 for consultative assistance with management of her diabetes.  DIABETIC HISTORY:  Ms. Davisson was diagnosed with DM many years ago. Her hemoglobin A1c has ranged from 10.3% in 2020, peaking at 16.3% in 2016.   On her initial visit to our clinic she had an A1c of 12.5 % She was on MDI regimen but was not taking Metformin due to GI side effects so we stopped it. We adjusted MDI regimen and continued Victoza    Started Farxiga 02/2020 but this was temporarily held by cardiology for urinary frequency     Lives with mother who is 66 yrs ago  SUBJECTIVE:   During the last visit (06/13/2020): A1c 14.3 %  continued victoza and adjusted MDI regimen , started farxiga       Today (12/25/2020): Ms. Wismer is here for a follow up on diabetes management.   She checks her blood sugars multiple times a week through CGM  . The patient has not had hypoglycemic episodes since the last clinic visit   She is undergoing rehab  Saw cardiology in 09/2020 and farxiga was help due to frequency without UTI   Pt admits to dietary indiscretions and drinking soda     HOME DIABETES REGIMEN:  Victoza 1.8 mg daily  Lantus  66 units daily - taking 60 units  Humalog 14 units with each meal  Farxiga 10 mg daily      Statin: yes ACE-I/ARB: yes     CONTINUOUS GLUCOSE MONITORING RECORD INTERPRETATION    Dates of Recording:12/2-12/15/2022  Sensor description: Dexcom  Results statistics:   CGM use % of time 93  Average and SD 338/71  Time in range 4%   % Time Above 180 8  % Time above 250 88  % Time Below target 0     Glycemic patterns summary: Hyperglycemia all day and night  Hyperglycemic episodes  All day and night   Hypoglycemic episodes occurred n/a  Overnight periods: high       DIABETIC COMPLICATIONS: Microvascular complications:  Retinopathy, neuropathy  Denies: CKD Last Eye Exam: Completed 12/24/2020  Macrovascular complications:  CVA, CAD (S/P PCI 03/2020 Denies: PVD   HISTORY:  Past Medical History:  Past Medical History:  Diagnosis Date   Bilateral carpal tunnel syndrome 03/27/2019   Boils    Glaucoma    Heart murmur    HTN (hypertension)    Hx of adenomatous colonic polyps    Hyperlipidemia    Osteoporosis    Pneumonia    Stroke ([place])    Type II or unspecified type diabetes mellitus without mention of complication, uncontrolled    Past Surgical History:  Past Surgical History:  Procedure Laterality Date   ABDOMINAL HYSTERECTOMY     CARPAL TUNNEL RELEASE     right   COLONOSCOPY  12-10-10   per Dr. Deatra Ina, clear, repeat in 7 yrs    CORONARY STENT INTERVENTION N/A 03/11/2020   Procedure: CORONARY STENT INTERVENTION;  Surgeon: Jettie Booze, MD;  Location: [place] [facility];  Service: Cardiovascular;  Laterality:  N/A;   INCISION AND DRAINAGE PERIRECTAL ABSCESS N/A 09/26/2015   Procedure: IRRIGATION AND DEBRIDEMENT PERIRECTAL ABSCESS;  Surgeon: Mickeal Skinner, MD;  Location: [place];  Service: General;  Laterality: N/A;   INTRAVASCULAR ULTRASOUND/IVUS N/A 03/11/2020   Procedure: Intravascular Ultrasound/IVUS;  Surgeon: Jettie Booze, MD;  Location: [place] [facility];  Service: Cardiovascular;  Laterality: N/A;   KNEE ARTHROSCOPY     right   LEFT HEART CATH AND CORONARY ANGIOGRAPHY N/A 03/11/2020   Procedure: LEFT HEART CATH AND CORONARY ANGIOGRAPHY;  Surgeon: Jettie Booze, MD;  Location: [place] [facility];  Service: Cardiovascular;  Laterality: N/A;   LOOP RECORDER  INSERTION N/A 10/19/2016   Procedure: LOOP RECORDER INSERTION;  Surgeon: Thompson Grayer, MD;  Location: [place] [facility];  Service: Cardiovascular;  Laterality: N/A;   ORIF ANKLE FRACTURE Right 03/24/2012   Procedure: OPEN REDUCTION INTERNAL FIXATION (ORIF) ANKLE FRACTURE;  Surgeon: Newt Minion, MD;  Location: [place];  Service: Orthopedics;  Laterality: Right;  Open Reduction Internal Fixation Right Bimalleolar ankle fracture   POLYPECTOMY     TEE WITHOUT CARDIOVERSION N/A 10/18/2016   Procedure: TRANSESOPHAGEAL ECHOCARDIOGRAM (TEE);  Surgeon: Fay Records, MD;  Location: [facility];  Service: Cardiovascular;  Laterality: N/A;   TONSILLECTOMY     Social History:  reports that she quit smoking about 4 years ago. Her smoking use included cigarettes. She has never used smokeless tobacco. She reports current alcohol use. She reports that she does not use drugs. Family History:  Family History  Problem Relation Age of Onset   Diabetes Mother    Hypertension Mother    Stroke Father    Stroke Maternal Uncle    Stroke Paternal Uncle    Colon cancer Neg Hx    Esophageal cancer Neg Hx    Rectal cancer Neg Hx    Stomach cancer Neg Hx    Inflammatory bowel disease Neg Hx    Liver disease Neg Hx    Pancreatic cancer Neg Hx      HOME MEDICATIONS: Allergies as of 12/25/2020   No Known Allergies      Medication List        Accurate as of December 25, 2020 10:55 AM. If you have any questions, ask your nurse or doctor.          acetaminophen 500 MG tablet Commonly known as: TYLENOL Take 1,000 mg by mouth every 6 (six) hours as needed for mild pain or headache.   amLODipine 10 MG tablet Commonly known as: NORVASC TAKE 1 TABLET(10 MG) BY MOUTH DAILY   aspirin EC 81 MG tablet Swallow whole. TAKE daily until after heart Cath What changed:  how much to take how to take this when to take this additional instructions   atorvastatin 80 MG tablet Commonly known as: LIPITOR TAKE  1 TABLET(80 MG) BY MOUTH DAILY AT 6 PM   calcium carbonate 750 MG chewable tablet Commonly known as: TUMS EX Chew 1 tablet by mouth as needed for heartburn.   clopidogrel 75 MG tablet Commonly known as: PLAVIX TAKE 1 TABLET(75 MG) BY MOUTH DAILY   dapagliflozin propanediol 10 MG Tabs tablet Commonly known as: Farxiga Take 1 tablet (10 mg total) by mouth daily.   Dexcom G6 Sensor Misc 1 Device by Does not apply route as directed.   Dexcom G6 Transmitter Misc 1 Device by Does not apply route as directed.   diclofenac Sodium 1 % Gel Commonly known as: Voltaren Apply 2  g topically 4 (four) times daily.   ezetimibe 10 MG tablet Commonly known as: ZETIA TAKE 1 TABLET(10 MG) BY MOUTH DAILY   furosemide 20 MG tablet Commonly known as: LASIX TAKE 1 TABLET(20 MG) BY MOUTH DAILY   HumaLOG KwikPen 200 UNIT/ML KwikPen Generic drug: insulin lispro Inject 14 Units into the skin 2 (two) times daily with breakfast and lunch AND 18 Units daily with supper. 15 U 15 min before meals.   Insulin Pen Needle 32G X 4 MM Misc 1 Device by Does not apply route in the morning, at noon, in the evening, and at bedtime.   Lantus SoloStar 100 UNIT/ML Solostar Pen Generic drug: insulin glargine Inject 64 Units into the skin daily.   losartan 50 MG tablet Commonly known as: COZAAR TAKE 1 TABLET(50 MG) BY MOUTH DAILY   metoprolol tartrate 25 MG tablet Commonly known as: LOPRESSOR Take 1 tablet (25 mg total) by mouth 2 (two) times daily.   Na Sulfate-K Sulfate-Mg Sulf 17.5-3.13-1.6 GM/177ML Soln Commonly known as: Suprep Bowel Prep Kit Take 1 kit by mouth as directed. For colonoscopy prep   nitroGLYCERIN 0.4 MG SL tablet Commonly known as: NITROSTAT Place 1 tablet (0.4 mg total) under the tongue every 5 (five) minutes as needed for chest pain. one tab every 5 minutes up to 3 tablets total over 15 minutes.   ONE TOUCH ULTRA TEST test strip Generic drug: glucose blood Check BS before and 2  hours after meals.   OT ULTRA/FASTTK CNTRL SOLN Soln   Pharmacist Choice Alcohol 70 % Misc Generic drug: Isopropyl Alcohol   Rocklatan 0.02-0.005 % Soln Generic drug: Netarsudil-Latanoprost Place 1 drop into both eyes every evening.   sertraline 50 MG tablet Commonly known as: ZOLOFT Take 1 tablet (50 mg total) by mouth daily.   Simple Diagnostics Lancing Dev Misc Check BS before and 2 hours after meals.   Victoza 18 MG/3ML Sopn Generic drug: liraglutide Inject 1.8 mg into the skin daily.         OBJECTIVE:   Vital Signs: BP 132/74 (BP Location: Right Arm, Patient Position: Sitting, Cuff Size: Normal)    Pulse 77    Ht 5' (1.524 m)    Wt 136 lb 9.6 oz (62 kg)    SpO2 96%    BMI 26.68 kg/m   Wt Readings from Last 3 Encounters:  12/25/20 136 lb 9.6 oz (62 kg)  12/16/20 139 lb (63 kg)  11/17/20 142 lb 2 oz (64.5 kg)     Exam: General: Pt appears well and is in NAD  Lungs: Clear with good BS bilat with no rales, rhonchi, or wheezes  Heart: RRR   Extremities: No pretibial edema.   Neuro: MS is good with appropriate affect, pt is alert and Ox3   DM foot exam: 12/25/2020 The skin of the feet is intact without sores or ulcerations. The pedal pulses are 1+ on right and 1+ on left. The sensation is intact to a screening 5.07, 10 gram monofilament bilaterally    DATA REVIEWED:  Lab Results  Component Value Date   HGBA1C 8.9 (A) 06/13/2020   HGBA1C 14.3 (A) 03/03/2020   HGBA1C 12.5 (H) 08/01/2019   Results for SRIHITHA, TAGLIAFERRI (MRN [id number]) as of 06/13/2020 15:46  Ref. Range 03/04/2020 95:32  BASIC METABOLIC PANEL Unknown Rpt (A)  Sodium Latest Ref Range: 134 - 144 mmol/L 137  Potassium Latest Ref Range: 3.5 - 5.2 mmol/L 4.2  Chloride Latest Ref Range: 96 - 106 mmol/L  102  CO2 Latest Ref Range: 20 - 29 mmol/L 21  Glucose Latest Ref Range: 65 - 99 mg/dL 375 (H)  BUN Latest Ref Range: 8 - 27 mg/dL 16  Creatinine Latest Ref Range: 0.57 - 1.00 mg/dL 0.65   Calcium Latest Ref Range: 8.7 - 10.3 mg/dL 9.7  BUN/Creatinine Ratio Latest Ref Range: 12 - 28  25    ASSESSMENT / PLAN / RECOMMENDATIONS:   1) Type 2 Diabetes Mellitus, Poorly controlled, With neuropathic and  macrovascular  complications - Most recent A1c of 12.9 %. Goal A1c < 7.0 %.    -Pt with dietary indiscretions. We discussed importance of avoiding sugar sweetened beverages ans compliance with medications - She assures me compliance , hence will increase her insulin as below   - Will make the following changes  - She will also be provided with correction scale     MEDICATIONS: - Continue Farxiga  10 mg, 1 tablet daily  - Continue Victoza 1.8 mg daily  - Increase Lantus to 70 units daily  - Increase  Humalog to 16 units with meals  - CF : Humalog (BG-130/30)    EDUCATION / INSTRUCTIONS: BG monitoring instructions: Patient is instructed to check her blood sugars 3 times a day, before meals  Call [facility] [facility] if: BG persistently < 70 I reviewed the Rule of 15 for the treatment of hypoglycemia in detail with the patient. Literature supplied.   2) Diabetic complications:  Eye: there's a questionable diabetic retinopathy from her history Neuro/ Feet: Does have known diabetic peripheral neuropathy .  Renal: Patient does not have known baseline CKD. She   is  on an ACEI/ARB at present.       F/U in 4 months    Signed electronically by: Mack Guise, MD  [facility]  [facility] [address] [place], [place] [address] Phone: [phone] FAX: [phone]   CC: [name], [name], [address] [place] [address] Phone: [phone]  Fax: [phone]  Return to Endocrinology clinic as below: Future Appointments  Date Time Provider [place]  12/25/2020 11:30 AM Ranata Laughery, Melanie Crazier, MD [facility] None  12/26/2020  7:30 AM [facility]   02/27/2021  9:30 AM Mansouraty, Telford Nab., MD [facility]  03/23/2021  9:40 AM Werner Lean, MD [facility]  04/17/2021 10:00 AM [name], [name], MD [facility]

## 2020-12-26 ENCOUNTER — Other Ambulatory Visit: Payer: Medicare Other | Admitting: *Deleted

## 2020-12-26 ENCOUNTER — Ambulatory Visit: Payer: Medicare Other | Admitting: Physical Therapy

## 2020-12-26 DIAGNOSIS — I700 Atherosclerosis of aorta: Secondary | ICD-10-CM

## 2020-12-26 DIAGNOSIS — I25118 Atherosclerotic heart disease of native coronary artery with other forms of angina pectoris: Secondary | ICD-10-CM

## 2020-12-26 DIAGNOSIS — E782 Mixed hyperlipidemia: Secondary | ICD-10-CM

## 2020-12-26 LAB — LIPID PANEL
Chol/HDL Ratio: 2.4 ratio (ref 0.0–4.4)
Cholesterol, Total: 190 mg/dL (ref 100–199)
HDL: 78 mg/dL (ref >39)
LDL Chol Calc (NIH): 88 mg/dL (ref 0–99)
Triglycerides: 143 mg/dL (ref 0–149)
VLDL Cholesterol Cal: 24 mg/dL (ref 5–40)

## 2020-12-31 ENCOUNTER — Ambulatory Visit: Payer: Medicare Other | Admitting: Physical Therapy

## 2020-12-31 DIAGNOSIS — E1165 Type 2 diabetes mellitus with hyperglycemia: Secondary | ICD-10-CM | POA: Diagnosis not present

## 2020-12-31 DIAGNOSIS — Z794 Long term (current) use of insulin: Secondary | ICD-10-CM | POA: Diagnosis not present

## 2021-01-15 DIAGNOSIS — I129 Hypertensive chronic kidney disease with stage 1 through stage 4 chronic kidney disease, or unspecified chronic kidney disease: Secondary | ICD-10-CM | POA: Diagnosis not present

## 2021-01-15 DIAGNOSIS — E785 Hyperlipidemia, unspecified: Secondary | ICD-10-CM | POA: Diagnosis not present

## 2021-01-15 DIAGNOSIS — R809 Proteinuria, unspecified: Secondary | ICD-10-CM | POA: Diagnosis not present

## 2021-01-15 DIAGNOSIS — E1129 Type 2 diabetes mellitus with other diabetic kidney complication: Secondary | ICD-10-CM | POA: Diagnosis not present

## 2021-01-15 DIAGNOSIS — N390 Urinary tract infection, site not specified: Secondary | ICD-10-CM | POA: Diagnosis not present

## 2021-02-23 ENCOUNTER — Encounter: Payer: Self-pay | Admitting: Gastroenterology

## 2021-02-27 ENCOUNTER — Other Ambulatory Visit: Payer: Self-pay

## 2021-02-27 ENCOUNTER — Ambulatory Visit (AMBULATORY_SURGERY_CENTER): Payer: Medicare Other | Admitting: Gastroenterology

## 2021-02-27 ENCOUNTER — Encounter: Payer: Self-pay | Admitting: Gastroenterology

## 2021-02-27 VITALS — BP 174/71 | HR 88 | Temp 98.9°F | Resp 16 | Ht 60.0 in | Wt 139.0 lb

## 2021-02-27 DIAGNOSIS — D123 Benign neoplasm of transverse colon: Secondary | ICD-10-CM | POA: Diagnosis not present

## 2021-02-27 DIAGNOSIS — I2510 Atherosclerotic heart disease of native coronary artery without angina pectoris: Secondary | ICD-10-CM | POA: Diagnosis not present

## 2021-02-27 DIAGNOSIS — Z86010 Personal history of colonic polyps: Secondary | ICD-10-CM

## 2021-02-27 MED ORDER — SODIUM CHLORIDE 0.9 % IV SOLN: 500.0000 mL | INTRAVENOUS | Status: DC

## 2021-02-27 NOTE — Progress Notes (Signed)
VS by CNW

## 2021-02-27 NOTE — Progress Notes (Signed)
Called to room to assist during endoscopic procedure.  Patient ID and intended procedure confirmed with present staff. Received instructions for my participation in the procedure from the performing physician.  

## 2021-02-27 NOTE — Progress Notes (Signed)
Report to PACU, RN, vss, BBS= Clear.  

## 2021-02-27 NOTE — Progress Notes (Signed)
GASTROENTEROLOGY PROCEDURE H&P NOTE   Primary Care Physician: [name], [name], MD  HPI: Sarah [name] is a 76 y.o. female who presents for Colonoscopy for colon cancer screening and colon polyp surveillance with prior adenomas.  Past Medical History:  Diagnosis Date   Bilateral carpal tunnel syndrome 03/27/2019   Boils    Glaucoma    Heart murmur    HTN (hypertension)    Hx of adenomatous colonic polyps    Hyperlipidemia    Osteoporosis    Pneumonia    Stroke ([place])    Type II or unspecified type diabetes mellitus without mention of complication, uncontrolled    Past Surgical History:  Procedure Laterality Date   ABDOMINAL HYSTERECTOMY     CARPAL TUNNEL RELEASE     right   COLONOSCOPY  12-10-10   per Dr. Deatra Ina, clear, repeat in 7 yrs    CORONARY STENT INTERVENTION N/A 03/11/2020   Procedure: CORONARY STENT INTERVENTION;  Surgeon: Jettie Booze, MD;  Location: [place] [facility];  Service: Cardiovascular;  Laterality: N/A;   INCISION AND DRAINAGE PERIRECTAL ABSCESS N/A 09/26/2015   Procedure: IRRIGATION AND DEBRIDEMENT PERIRECTAL ABSCESS;  Surgeon: Mickeal Skinner, MD;  Location: [place];  Service: General;  Laterality: N/A;   INTRAVASCULAR ULTRASOUND/IVUS N/A 03/11/2020   Procedure: Intravascular Ultrasound/IVUS;  Surgeon: Jettie Booze, MD;  Location: [place];  Service: Cardiovascular;  Laterality: N/A;   KNEE ARTHROSCOPY     right   LEFT HEART CATH AND CORONARY ANGIOGRAPHY N/A 03/11/2020   Procedure: LEFT HEART CATH AND CORONARY ANGIOGRAPHY;  Surgeon: Jettie Booze, MD;  Location: [facility];  Service: Cardiovascular;  Laterality: N/A;   LOOP RECORDER INSERTION N/A 10/19/2016   Procedure: LOOP RECORDER INSERTION;  Surgeon: Thompson Grayer, MD;  Location: [facility];  Service: Cardiovascular;  Laterality: N/A;   ORIF ANKLE FRACTURE Right 03/24/2012   Procedure: OPEN REDUCTION INTERNAL FIXATION (ORIF) ANKLE FRACTURE;   Surgeon: Newt Minion, MD;  Location: [place];  Service: Orthopedics;  Laterality: Right;  Open Reduction Internal Fixation Right Bimalleolar ankle fracture   POLYPECTOMY     TEE WITHOUT CARDIOVERSION N/A 10/18/2016   Procedure: TRANSESOPHAGEAL ECHOCARDIOGRAM (TEE);  Surgeon: Fay Records, MD;  Location: [facility];  Service: Cardiovascular;  Laterality: N/A;   TONSILLECTOMY     Current Outpatient Medications  Medication Sig Dispense Refill   amLODipine (NORVASC) 10 MG tablet TAKE 1 TABLET(10 MG) BY MOUTH DAILY 90 tablet 2   atorvastatin (LIPITOR) 80 MG tablet TAKE 1 TABLET(80 MG) BY MOUTH DAILY AT 6 PM 90 tablet 2   clopidogrel (PLAVIX) 75 MG tablet TAKE 1 TABLET(75 MG) BY MOUTH DAILY 90 tablet 1   acetaminophen (TYLENOL) 500 MG tablet Take 1,000 mg by mouth every 6 (six) hours as needed for mild pain or headache.     aspirin EC 81 MG tablet Swallow whole. TAKE daily until after heart Cath (Patient not taking: Reported on 02/27/2021) 30 tablet 11   Blood Glucose Calibration (OT ULTRA/FASTTK CNTRL SOLN) SOLN      calcium carbonate (TUMS EX) 750 MG chewable tablet Chew 1 tablet by mouth as needed for heartburn.     Continuous Blood Gluc Sensor (DEXCOM G6 SENSOR) MISC 1 Device by Does not apply route as directed. 3 each 11   Continuous Blood Gluc Transmit (DEXCOM G6 TRANSMITTER) MISC 1 Device by Does not apply route as directed. 1 each 3   dapagliflozin propanediol (FARXIGA) 10  MG TABS tablet Take 1 tablet (10 mg total) by mouth daily. 90 tablet 3   diclofenac Sodium (VOLTAREN) 1 % GEL Apply 2 g topically 4 (four) times daily. 150 g 3   ezetimibe (ZETIA) 10 MG tablet TAKE 1 TABLET(10 MG) BY MOUTH DAILY 90 tablet 3   furosemide (LASIX) 20 MG tablet TAKE 1 TABLET(20 MG) BY MOUTH DAILY 30 tablet 2   insulin glargine (LANTUS SOLOSTAR) 100 UNIT/ML Solostar Pen Inject 64 Units into the skin daily. 60 mL 3   insulin lispro (HUMALOG KWIKPEN) 200 UNIT/ML KwikPen Inject 14 Units into the skin 2 (two)  times daily with breakfast and lunch AND 18 Units daily with supper. 15 U 15 min before meals. 30 mL 3   Insulin Pen Needle 32G X 4 MM MISC 1 Device by Does not apply route in the morning, at noon, in the evening, and at bedtime. 400 each 3   Lancet Devices (SIMPLE DIAGNOSTICS LANCING DEV) MISC Check BS before and 2 hours after meals. 200 each 6   liraglutide (VICTOZA) 18 MG/3ML SOPN Inject 1.8 mg into the skin daily. 9 mL 3   losartan (COZAAR) 50 MG tablet TAKE 1 TABLET(50 MG) BY MOUTH DAILY 90 tablet 1   metoprolol tartrate (LOPRESSOR) 25 MG tablet Take 1 tablet (25 mg total) by mouth 2 (two) times daily. 180 tablet 3   Na Sulfate-K Sulfate-Mg Sulf (SUPREP BOWEL PREP KIT) 17.5-3.13-1.6 GM/177ML SOLN Take 1 kit by mouth as directed. For colonoscopy prep 354 mL 0   nitroGLYCERIN (NITROSTAT) 0.4 MG SL tablet Place 1 tablet (0.4 mg total) under the tongue every 5 (five) minutes as needed for chest pain. one tab every 5 minutes up to 3 tablets total over 15 minutes. 25 tablet 3   ONE TOUCH ULTRA TEST test strip Check BS before and 2 hours after meals. 200 each 6   PHARMACIST CHOICE ALCOHOL 70 % PADS      ROCKLATAN 0.02-0.005 % SOLN Place 1 drop into both eyes every evening.     sertraline (ZOLOFT) 50 MG tablet Take 1 tablet (50 mg total) by mouth daily. 90 tablet 1   Current Facility-Administered Medications  Medication Dose Route Frequency Provider Last Rate Last Admin   0.9 %  sodium chloride infusion  500 mL Intravenous Continuous Mansouraty, Telford Nab., MD        Current Outpatient Medications:    amLODipine (NORVASC) 10 MG tablet, TAKE 1 TABLET(10 MG) BY MOUTH DAILY, Disp: 90 tablet, Rfl: 2   atorvastatin (LIPITOR) 80 MG tablet, TAKE 1 TABLET(80 MG) BY MOUTH DAILY AT 6 PM, Disp: 90 tablet, Rfl: 2   clopidogrel (PLAVIX) 75 MG tablet, TAKE 1 TABLET(75 MG) BY MOUTH DAILY, Disp: 90 tablet, Rfl: 1   acetaminophen (TYLENOL) 500 MG tablet, Take 1,000 mg by mouth every 6 (six) hours as needed for  mild pain or headache., Disp: , Rfl:    aspirin EC 81 MG tablet, Swallow whole. TAKE daily until after heart Cath (Patient not taking: Reported on 02/27/2021), Disp: 30 tablet, Rfl: 11   Blood Glucose Calibration (OT ULTRA/FASTTK CNTRL SOLN) SOLN, , Disp: , Rfl:    calcium carbonate (TUMS EX) 750 MG chewable tablet, Chew 1 tablet by mouth as needed for heartburn., Disp: , Rfl:    Continuous Blood Gluc Sensor (DEXCOM G6 SENSOR) MISC, 1 Device by Does not apply route as directed., Disp: 3 each, Rfl: 11   Continuous Blood Gluc Transmit (DEXCOM G6 TRANSMITTER) MISC, 1 Device by  Does not apply route as directed., Disp: 1 each, Rfl: 3   dapagliflozin propanediol (FARXIGA) 10 MG TABS tablet, Take 1 tablet (10 mg total) by mouth daily., Disp: 90 tablet, Rfl: 3   diclofenac Sodium (VOLTAREN) 1 % GEL, Apply 2 g topically 4 (four) times daily., Disp: 150 g, Rfl: 3   ezetimibe (ZETIA) 10 MG tablet, TAKE 1 TABLET(10 MG) BY MOUTH DAILY, Disp: 90 tablet, Rfl: 3   furosemide (LASIX) 20 MG tablet, TAKE 1 TABLET(20 MG) BY MOUTH DAILY, Disp: 30 tablet, Rfl: 2   insulin glargine (LANTUS SOLOSTAR) 100 UNIT/ML Solostar Pen, Inject 64 Units into the skin daily., Disp: 60 mL, Rfl: 3   insulin lispro (HUMALOG KWIKPEN) 200 UNIT/ML KwikPen, Inject 14 Units into the skin 2 (two) times daily with breakfast and lunch AND 18 Units daily with supper. 15 U 15 min before meals., Disp: 30 mL, Rfl: 3   Insulin Pen Needle 32G X 4 MM MISC, 1 Device by Does not apply route in the morning, at noon, in the evening, and at bedtime., Disp: 400 each, Rfl: 3   Lancet Devices (SIMPLE DIAGNOSTICS LANCING DEV) MISC, Check BS before and 2 hours after meals., Disp: 200 each, Rfl: 6   liraglutide (VICTOZA) 18 MG/3ML SOPN, Inject 1.8 mg into the skin daily., Disp: 9 mL, Rfl: 3   losartan (COZAAR) 50 MG tablet, TAKE 1 TABLET(50 MG) BY MOUTH DAILY, Disp: 90 tablet, Rfl: 1   metoprolol tartrate (LOPRESSOR) 25 MG tablet, Take 1 tablet (25 mg total) by  mouth 2 (two) times daily., Disp: 180 tablet, Rfl: 3   Na Sulfate-K Sulfate-Mg Sulf (SUPREP BOWEL PREP KIT) 17.5-3.13-1.6 GM/177ML SOLN, Take 1 kit by mouth as directed. For colonoscopy prep, Disp: 354 mL, Rfl: 0   nitroGLYCERIN (NITROSTAT) 0.4 MG SL tablet, Place 1 tablet (0.4 mg total) under the tongue every 5 (five) minutes as needed for chest pain. one tab every 5 minutes up to 3 tablets total over 15 minutes., Disp: 25 tablet, Rfl: 3   ONE TOUCH ULTRA TEST test strip, Check BS before and 2 hours after meals., Disp: 200 each, Rfl: 6   PHARMACIST CHOICE ALCOHOL 70 % PADS, , Disp: , Rfl:    ROCKLATAN 0.02-0.005 % SOLN, Place 1 drop into both eyes every evening., Disp: , Rfl:    sertraline (ZOLOFT) 50 MG tablet, Take 1 tablet (50 mg total) by mouth daily., Disp: 90 tablet, Rfl: 1  Current Facility-Administered Medications:    0.9 %  sodium chloride infusion, 500 mL, Intravenous, Continuous, Mansouraty, Telford Nab., MD No Known Allergies Family History  Problem Relation Age of Onset   Diabetes Mother    Hypertension Mother    Stroke Father    Stroke Maternal Uncle    Stroke Paternal Uncle    Colon cancer Neg Hx    Esophageal cancer Neg Hx    Rectal cancer Neg Hx    Stomach cancer Neg Hx    Inflammatory bowel disease Neg Hx    Liver disease Neg Hx    Pancreatic cancer Neg Hx    Social History   Socioeconomic History   Marital status: Single    Spouse name: Not on file   Number of children: Not on file   Years of education: Not on file   Highest education level: Not on file  Occupational History   Not on file  Tobacco Use   Smoking status: Former    Types: Cigarettes    Quit date: 10/15/2016  Years since quitting: 4.3   Smokeless tobacco: Never   Tobacco comments:    smokes occ.   Vaping Use   Vaping Use: Never used  Substance and Sexual Activity   Alcohol use: Yes    Alcohol/week: 0.0 standard drinks    Comment: occ   Drug use: No   Sexual activity: Not on file   Other Topics Concern   Not on file  Social History Narrative   Not on file   Social Determinants of Health   Financial Resource Strain: Low Risk    Difficulty of Paying Living Expenses: Not hard at all  Food Insecurity: No Food Insecurity   Worried About Charity fundraiser in the Last Year: Never true   [place] in the Last Year: Never true  Transportation Needs: No Transportation Needs   Lack of Transportation (Medical): No   Lack of Transportation (Non-Medical): No  Physical Activity: Inactive   Days of Exercise per Week: 0 days   Minutes of Exercise per Session: 0 min  Stress: No Stress Concern Present   Feeling of Stress : Not at all  Social Connections: Socially Isolated   Frequency of Communication with Friends and Family: More than three times a week   Frequency of Social Gatherings with Friends and Family: More than three times a week   Attends Religious Services: Never   Marine scientist or Organizations: No   Attends Archivist Meetings: Never   Marital Status: Divorced  Human resources officer Violence: Not At Risk   Fear of Current or Ex-Partner: No   Emotionally Abused: No   Physically Abused: No   Sexually Abused: No    Physical Exam: Today's Vitals   02/27/21 0920 02/27/21 0927  BP: (!) 150/79   Pulse: 84   Temp: 98.9 F (37.2 C) 98.9 F (37.2 C)  Weight: 139 lb (63 kg)   Height: 5' (1.524 m)    Body mass index is 27.15 kg/m. GEN: NAD EYE: Sclerae anicteric ENT: MMM CV: Non-tachycardic GI: Soft, NT/ND NEURO:  Alert & Oriented x 3  Lab Results: No results for input(s): WBC, HGB, HCT, PLT in the last 72 hours. BMET No results for input(s): NA, K, CL, CO2, GLUCOSE, BUN, CREATININE, CALCIUM in the last 72 hours. LFT No results for input(s): PROT, ALBUMIN, AST, ALT, ALKPHOS, BILITOT, BILIDIR, IBILI in the last 72 hours. PT/INR No results for input(s): LABPROT, INR in the last 72 hours.   Impression / Plan: This is a 76  y.o.female who presents for Colonoscopy for colon cancer screening and colon polyp surveillance with prior adenomas.  The risks and benefits of endoscopic evaluation/treatment were discussed with the patient and/or family; these include but are not limited to the risk of perforation, infection, bleeding, missed lesions, lack of diagnosis, severe illness requiring hospitalization, as well as anesthesia and sedation related illnesses.  The patient's history has been reviewed, patient examined, no change in status, and deemed stable for procedure.  The patient and/or family is agreeable to proceed.    Justice Britain, MD [facility] Office # [phone]

## 2021-02-27 NOTE — Patient Instructions (Signed)
Handouts provided on polyps and hemorrhoids.   Restart Plavix in 24 hours (02/28/21).   Recommend a high-fiber diet (see handout). Use FiberCon 1-2 tablets by mouth daily.   Repeat colonoscopy within next 6-12 months (or whenever you feel ready to reschedule) because the bowel preparation was poor. You will need a 2 day preparation with 1 week of Miralax daily. Please call my office when you are ready to reschedule.   YOU HAD AN ENDOSCOPIC PROCEDURE TODAY AT [facility]:   Refer to the procedure report that was given to you for any specific questions about what was found during the examination.  If the procedure report does not answer your questions, please call your gastroenterologist to clarify.  If you requested that your care partner not be given the details of your procedure findings, then the procedure report has been included in a sealed envelope for you to review at your convenience later.  YOU SHOULD EXPECT: Some feelings of bloating in the abdomen. Passage of more gas than usual.  Walking can help get rid of the air that was put into your GI tract during the procedure and reduce the bloating. If you had a lower endoscopy (such as a colonoscopy or flexible sigmoidoscopy) you may notice spotting of blood in your stool or on the toilet paper. If you underwent a bowel prep for your procedure, you may not have a normal bowel movement for a few days.  Please Note:  You might notice some irritation and congestion in your nose or some drainage.  This is from the oxygen used during your procedure.  There is no need for concern and it should clear up in a day or so.  SYMPTOMS TO REPORT IMMEDIATELY:  Following lower endoscopy (colonoscopy or flexible sigmoidoscopy):  Excessive amounts of blood in the stool  Significant tenderness or worsening of abdominal pains  Swelling of the abdomen that is new, acute  Fever of 100F or higher  For urgent or emergent issues, a  gastroenterologist can be reached at any hour by calling [phone]. Do not use MyChart messaging for urgent concerns.    DIET:  We do recommend a small meal at first, but then you may proceed to your regular diet.  Drink plenty of fluids but you should avoid alcoholic beverages for 24 hours.  ACTIVITY:  You should plan to take it easy for the rest of today and you should NOT DRIVE or use heavy machinery until tomorrow (because of the sedation medicines used during the test).    FOLLOW UP: Our staff will call the number listed on your records 48-72 hours following your procedure to check on you and address any questions or concerns that you may have regarding the information given to you following your procedure. If we do not reach you, we will leave a message.  We will attempt to reach you two times.  During this call, we will ask if you have developed any symptoms of COVID 19. If you develop any symptoms (ie: fever, flu-like symptoms, shortness of breath, cough etc.) before then, please call [phone].  If you test positive for Covid 19 in the 2 weeks post procedure, please call and report this information to [place].    If any biopsies were taken you will be contacted by phone or by letter within the next 1-3 weeks.  Please call us at [phone] if you have not heard about the biopsies in 3 weeks.    SIGNATURES/CONFIDENTIALITY: You  and/or your care partner have signed paperwork which will be entered into your electronic medical record.  These signatures attest to the fact that that the information above on your After Visit Summary has been reviewed and is understood.  Full responsibility of the confidentiality of this discharge information lies with you and/or your care-partner.

## 2021-02-27 NOTE — Op Note (Signed)
[facility] Patient Name: Sarah [name] Procedure Date: 02/27/2021 10:13 AM MRN: [id number] Endoscopist: Justice Britain , MD Age: 76 Referring MD:  Date of Birth: 09/10/1945 Gender: Female Account #: [id number] Procedure:                Colonoscopy Indications:              Surveillance: Personal history of adenomatous                            polyps on last colonoscopy > 10 years ago Medicines:                Monitored Anesthesia Care Procedure:                Pre-Anesthesia Assessment:                           - Prior to the procedure, a History and Physical                            was performed, and patient medications and                            allergies were reviewed. The patient's tolerance of                            previous anesthesia was also reviewed. The risks                            and benefits of the procedure and the sedation                            options and risks were discussed with the patient.                            All questions were answered, and informed consent                            was obtained. Prior Anticoagulants: The patient has                            taken Plavix (clopidogrel), last dose was 4 days                            prior to procedure. ASA Grade Assessment: III - A                            patient with severe systemic disease. After                            reviewing the risks and benefits, the patient was                            deemed in satisfactory condition to undergo the  procedure.                           After obtaining informed consent, the colonoscope                            was passed under direct vision. Throughout the                            procedure, the patient's blood pressure, pulse, and                            oxygen saturations were monitored continuously. The                            PCF-HQ190L Colonoscope was introduced through  the                            anus and advanced to the the cecum, identified by                            the appendiceal orifice. The colonoscopy was                            performed without difficulty. The patient tolerated                            the procedure. The quality of the bowel preparation                            was unsatisfactory. The terminal ileum, ileocecal                            valve, appendiceal orifice, and rectum were                            photographed. Scope In: 10:28:19 AM Scope Out: 10:40:03 AM Scope Withdrawal Time: 0 hours 7 minutes 15 seconds  Total Procedure Duration: 0 hours 11 minutes 44 seconds  Findings:                 The digital rectal exam findings include                            hemorrhoids. Pertinent negatives include no                            palpable rectal lesions.                           Copious quantities of semi-liquid semi-solid stool                            was found in the entire colon, precluding  visualization. Lavage of the area was performed                            using copious amounts, resulting in incomplete                            clearance with continued poor visualization.                           A 3 mm polyp was found in the hepatic flexure. The                            polyp was sessile. The polyp was removed with a                            cold snare. Resection and retrieval were complete.                           Non-bleeding non-thrombosed external and internal                            hemorrhoids were found during perianal exam and                            during endoscopy. The hemorrhoids were Grade II                            (internal hemorrhoids that prolapse but reduce                            spontaneously). Complications:            No immediate complications. Estimated Blood Loss:     Estimated blood loss was minimal. Impression:                - Preparation of the colon was unsatisfactory as                            stool was in the entire colon. Could not get an                            adequate visualization unfortunately..                           - Hemorrhoids found on digital rectal exam.                           - Stool in the entire examined colon.                           - One 3 mm polyp at the hepatic flexure, removed                            with a cold snare. Resected and retrieved.                           -  Non-bleeding non-thrombosed external and internal                            hemorrhoids. Recommendation:           - The patient will be observed post-procedure,                            until all discharge criteria are met.                           - Discharge patient to home.                           - Patient has a contact number available for                            emergencies. The signs and symptoms of potential                            delayed complications were discussed with the                            patient. Return to normal activities tomorrow.                            Written discharge instructions were provided to the                            patient.                           - High fiber diet.                           - Use FiberCon 1-2 tablets PO daily.                           - Await pathology results.                           - Restart Plavix in 24 hours (2/18).                           - Continue present medications.                           - Repeat colonoscopy within next 6-12 months (or                            whenever patient feels ready to reschedule) because                            the bowel preparation was poor. 2 day preparation                            with 1 week  of Miralax daily will be required.                           - The findings and recommendations were discussed                            with the patient.                            - The findings and recommendations were discussed                            with the patient's family. Justice Britain, MD 02/27/2021 10:47:55 AM

## 2021-03-04 ENCOUNTER — Encounter: Payer: Self-pay | Admitting: Gastroenterology

## 2021-03-04 ENCOUNTER — Telehealth: Payer: Self-pay

## 2021-03-04 NOTE — Telephone Encounter (Signed)
°  Follow up Call-  Call back number 02/27/2021  Post procedure Call Back phone  # [phone]  Permission to leave phone message Yes  Some recent data might be hidden     Patient questions:  Do you have a fever, pain , or abdominal swelling? No. Pain Score  0 *  Have you tolerated food without any problems? Yes.    Have you been able to return to your normal activities? Yes.    Do you have any questions about your discharge instructions: Diet   No. Medications  No. Follow up visit  No.  Do you have questions or concerns about your Care? No.  Actions: * If pain score is 4 or above: No action needed, pain <4.

## 2021-03-05 DIAGNOSIS — E785 Hyperlipidemia, unspecified: Secondary | ICD-10-CM | POA: Diagnosis not present

## 2021-03-05 DIAGNOSIS — I129 Hypertensive chronic kidney disease with stage 1 through stage 4 chronic kidney disease, or unspecified chronic kidney disease: Secondary | ICD-10-CM | POA: Diagnosis not present

## 2021-03-05 DIAGNOSIS — R809 Proteinuria, unspecified: Secondary | ICD-10-CM | POA: Diagnosis not present

## 2021-03-05 DIAGNOSIS — E1129 Type 2 diabetes mellitus with other diabetic kidney complication: Secondary | ICD-10-CM | POA: Diagnosis not present

## 2021-03-05 LAB — BASIC METABOLIC PANEL
BUN: 11 (ref 4–21)
CO2: 29 — AB (ref 13–22)
Chloride: 106 (ref 99–108)
Creatinine: 0.7 (ref 0.5–1.1)
Glucose: 254
Potassium: 4 (ref 3.4–5.3)
Sodium: 142 (ref 137–147)

## 2021-03-05 LAB — COMPREHENSIVE METABOLIC PANEL
Albumin: 2.9 — AB (ref 3.5–5.0)
Calcium: 9.8 (ref 8.7–10.7)

## 2021-03-06 ENCOUNTER — Other Ambulatory Visit (HOSPITAL_COMMUNITY): Payer: Self-pay | Admitting: Internal Medicine

## 2021-03-06 ENCOUNTER — Other Ambulatory Visit: Payer: Self-pay | Admitting: Internal Medicine

## 2021-03-06 DIAGNOSIS — R809 Proteinuria, unspecified: Secondary | ICD-10-CM

## 2021-03-13 ENCOUNTER — Encounter: Payer: Self-pay | Admitting: Family Medicine

## 2021-03-21 DIAGNOSIS — Z794 Long term (current) use of insulin: Secondary | ICD-10-CM | POA: Diagnosis not present

## 2021-03-21 DIAGNOSIS — E1165 Type 2 diabetes mellitus with hyperglycemia: Secondary | ICD-10-CM | POA: Diagnosis not present

## 2021-03-23 ENCOUNTER — Ambulatory Visit: Payer: Medicare Other | Admitting: Internal Medicine

## 2021-04-08 ENCOUNTER — Encounter: Payer: Self-pay | Admitting: Internal Medicine

## 2021-04-08 ENCOUNTER — Ambulatory Visit: Payer: Medicare Other | Admitting: Internal Medicine

## 2021-04-08 VITALS — BP 145/74 | HR 80 | Ht 60.0 in | Wt 131.0 lb

## 2021-04-08 DIAGNOSIS — I700 Atherosclerosis of aorta: Secondary | ICD-10-CM

## 2021-04-08 DIAGNOSIS — R011 Cardiac murmur, unspecified: Secondary | ICD-10-CM | POA: Insufficient documentation

## 2021-04-08 DIAGNOSIS — R42 Dizziness and giddiness: Secondary | ICD-10-CM | POA: Diagnosis not present

## 2021-04-08 DIAGNOSIS — I25118 Atherosclerotic heart disease of native coronary artery with other forms of angina pectoris: Secondary | ICD-10-CM | POA: Diagnosis not present

## 2021-04-08 DIAGNOSIS — I6529 Occlusion and stenosis of unspecified carotid artery: Secondary | ICD-10-CM

## 2021-04-08 NOTE — Progress Notes (Signed)
?Cardiology Office Note:   ? ?Date:  04/08/2021  ? ?ID:  Sarah [name], DOB 09/18/1945, MRN [id number] ? ?PCP:  [name], [name], MD  ?[facility] Cardiologist:  Rudean Haskell MD ?[facility] Electrophysiologist:  None  ? ?CC: Follow up CAD ? ?History of Present Illness:   ? ?Sarah [name] is a 76 y.o. female with a hx of DM with HTN, Prior Stroke in 2018, Aortic atherosclerosis who presents for evaluation 2/22. ?2022: had echocardiogram that was relatively benign and a positive CCTA for RCA disease, had LHC and PCI.  patient had normal vascular duplex. ? ?Patient notes that she is doing well.   ?Since last visit her nephew is around more and she is going outdoors with him . ?Still takes care of her mom. ?There are no interval hospital/ED visit.   ? ?No chest pain or pressure.  No PRN nitro needed.  Does note that she is off balance more.  No SOB/DOE and no PND/Orthopnea.  No weight gain or leg swelling.  No palpitations or syncope.  Feels dizzy and has had some near falls. ? ?Ambulatory blood pressure 130/70. ? ?Past Medical History:  ?Diagnosis Date  ? Bilateral carpal tunnel syndrome 03/27/2019  ? Boils   ? Glaucoma   ? Heart murmur   ? HTN (hypertension)   ? Hx of adenomatous colonic polyps   ? Hyperlipidemia   ? Osteoporosis   ? Pneumonia   ? Stroke [facility])   ? Type II or unspecified type diabetes mellitus without mention of complication, uncontrolled   ? ? ?Past Surgical History:  ?Procedure Laterality Date  ? ABDOMINAL HYSTERECTOMY    ? CARPAL TUNNEL RELEASE    ? right  ? COLONOSCOPY  12-10-10  ? per Dr. Deatra Ina, clear, repeat in 7 yrs   ? CORONARY STENT INTERVENTION N/A 03/11/2020  ? Procedure: CORONARY STENT INTERVENTION;  Surgeon: Jettie Booze, MD;  Location: [place] [facility];  Service: Cardiovascular;  Laterality: N/A;  ? INCISION AND DRAINAGE PERIRECTAL ABSCESS N/A 09/26/2015  ? Procedure: IRRIGATION AND DEBRIDEMENT PERIRECTAL ABSCESS;  Surgeon: Mickeal Skinner, MD;   Location: [place];  Service: General;  Laterality: N/A;  ? INTRAVASCULAR ULTRASOUND/IVUS N/A 03/11/2020  ? Procedure: Intravascular Ultrasound/IVUS;  Surgeon: Jettie Booze, MD;  Location: [place] [facility];  Service: Cardiovascular;  Laterality: N/A;  ? KNEE ARTHROSCOPY    ? right  ? LEFT HEART CATH AND CORONARY ANGIOGRAPHY N/A 03/11/2020  ? Procedure: LEFT HEART CATH AND CORONARY ANGIOGRAPHY;  Surgeon: Jettie Booze, MD;  Location: [place] [facility];  Service: Cardiovascular;  Laterality: N/A;  ? LOOP RECORDER INSERTION N/A 10/19/2016  ? Procedure: LOOP RECORDER INSERTION;  Surgeon: Thompson Grayer, MD;  Location: [place] [facility];  Service: Cardiovascular;  Laterality: N/A;  ? ORIF ANKLE FRACTURE Right 03/24/2012  ? Procedure: OPEN REDUCTION INTERNAL FIXATION (ORIF) ANKLE FRACTURE;  Surgeon: Newt Minion, MD;  Location: [place];  Service: Orthopedics;  Laterality: Right;  Open Reduction Internal Fixation Right Bimalleolar ankle fracture  ? POLYPECTOMY    ? TEE WITHOUT CARDIOVERSION N/A 10/18/2016  ? Procedure: TRANSESOPHAGEAL ECHOCARDIOGRAM (TEE);  Surgeon: Fay Records, MD;  Location: [place];  Service: Cardiovascular;  Laterality: N/A;  ? TONSILLECTOMY    ? ? ?Current Medications: ?Current Meds  ?Medication Sig  ? acetaminophen (TYLENOL) 500 MG tablet Take 1,000 mg by mouth every 6 (six) hours as needed for mild pain or headache.  ? amLODipine (NORVASC) 10 MG  tablet TAKE 1 TABLET(10 MG) BY MOUTH DAILY  ? atorvastatin (LIPITOR) 80 MG tablet TAKE 1 TABLET(80 MG) BY MOUTH DAILY AT 6 PM  ? Blood Glucose Calibration (OT ULTRA/FASTTK CNTRL SOLN) SOLN   ? calcium carbonate (TUMS EX) 750 MG chewable tablet Chew 1 tablet by mouth as needed for heartburn.  ? clopidogrel (PLAVIX) 75 MG tablet TAKE 1 TABLET(75 MG) BY MOUTH DAILY  ? Continuous Blood Gluc Sensor (DEXCOM G6 SENSOR) MISC 1 Device by Does not apply route as directed.  ? Continuous Blood Gluc Transmit (DEXCOM G6 TRANSMITTER) MISC 1 Device by Does not  apply route as directed.  ? dapagliflozin propanediol (FARXIGA) 10 MG TABS tablet Take 1 tablet (10 mg total) by mouth daily.  ? diclofenac Sodium (VOLTAREN) 1 % GEL Apply 2 g topically 4 (four) times daily. (Patient taking differently: Apply 2 g topically as needed (joint pain).)  ? ezetimibe (ZETIA) 10 MG tablet TAKE 1 TABLET(10 MG) BY MOUTH DAILY  ? furosemide (LASIX) 20 MG tablet TAKE 1 TABLET(20 MG) BY MOUTH DAILY  ? insulin glargine (LANTUS SOLOSTAR) 100 UNIT/ML Solostar Pen Inject 64 Units into the skin daily.  ? insulin lispro (HUMALOG KWIKPEN) 200 UNIT/ML KwikPen Inject 14 Units into the skin 2 (two) times daily with breakfast and lunch AND 18 Units daily with supper. 15 U 15 min before meals.  ? Insulin Pen Needle 32G X 4 MM MISC 1 Device by Does not apply route in the morning, at noon, in the evening, and at bedtime.  ? Lancet Devices (SIMPLE DIAGNOSTICS LANCING DEV) MISC Check BS before and 2 hours after meals.  ? liraglutide (VICTOZA) 18 MG/3ML SOPN Inject 1.8 mg into the skin daily.  ? losartan (COZAAR) 50 MG tablet TAKE 1 TABLET(50 MG) BY MOUTH DAILY  ? Na Sulfate-K Sulfate-Mg Sulf (SUPREP BOWEL PREP KIT) 17.5-3.13-1.6 GM/177ML SOLN Take 1 kit by mouth as directed. For colonoscopy prep  ? nitroGLYCERIN (NITROSTAT) 0.4 MG SL tablet Place 1 tablet (0.4 mg total) under the tongue every 5 (five) minutes as needed for chest pain. one tab every 5 minutes up to 3 tablets total over 15 minutes.  ? ONE TOUCH ULTRA TEST test strip Check BS before and 2 hours after meals.  ? PHARMACIST CHOICE ALCOHOL 70 % PADS   ? ROCKLATAN 0.02-0.005 % SOLN Place 1 drop into both eyes every evening.  ? sertraline (ZOLOFT) 50 MG tablet Take 1 tablet (50 mg total) by mouth daily.  ?  ? ?Allergies:   Patient has no known allergies.  ? ?Social History  ? ?Socioeconomic History  ? Marital status: Single  ?  Spouse name: Not on file  ? Number of children: Not on file  ? Years of education: Not on file  ? Highest education level: Not  on file  ?Occupational History  ? Not on file  ?Tobacco Use  ? Smoking status: Former  ?  Types: Cigarettes  ?  Quit date: 10/15/2016  ?  Years since quitting: 4.4  ? Smokeless tobacco: Never  ? Tobacco comments:  ?  smokes occ.   ?Vaping Use  ? Vaping Use: Never used  ?Substance and Sexual Activity  ? Alcohol use: Yes  ?  Alcohol/week: 0.0 standard drinks  ?  Comment: occ  ? Drug use: No  ? Sexual activity: Not on file  ?Other Topics Concern  ? Not on file  ?Social History Narrative  ? Not on file  ? ?Social Determinants of Health  ? ?Financial  Resource Strain: Low Risk   ? Difficulty of Paying Living Expenses: Not hard at all  ?Food Insecurity: No Food Insecurity  ? Worried About Charity fundraiser in the Last Year: Never true  ? Ran Out of Food in the Last Year: Never true  ?Transportation Needs: No Transportation Needs  ? Lack of Transportation (Medical): No  ? Lack of Transportation (Non-Medical): No  ?Physical Activity: Inactive  ? Days of Exercise per Week: 0 days  ? Minutes of Exercise per Session: 0 min  ?Stress: No Stress Concern Present  ? Feeling of Stress : Not at all  ?Social Connections: Socially Isolated  ? Frequency of Communication with Friends and Family: More than three times a week  ? Frequency of Social Gatherings with Friends and Family: More than three times a week  ? Attends Religious Services: Never  ? Active Member of Clubs or Organizations: No  ? Attends Archivist Meetings: Never  ? Marital Status: Divorced  ?  ?Family History: ?The patient's family history includes Diabetes in her mother; Hypertension in her mother; Stroke in her father, maternal uncle, and paternal uncle. There is no history of Colon cancer, Esophageal cancer, Rectal cancer, Stomach cancer, Inflammatory bowel disease, Liver disease, or Pancreatic cancer. ?History of coronary artery disease notable for no members. ?History of heart failure notable for father. ?History of arrhythmia notable for no  members. ? ?ROS:   ?Please see the history of present illness.    ? All other systems reviewed and are negative. ? ?EKGs/Labs/Other Studies Reviewed:   ? ?The following studies were reviewed today: ? ?EKG:   ?04/08/21

## 2021-04-08 NOTE — Patient Instructions (Signed)
Medication Instructions:  ?Your physician recommends that you continue on your current medications as directed. Please refer to the Current Medication list given to you today. ? ?*If you need a refill on your cardiac medications before your next appointment, please call your pharmacy* ? ? ?Lab Work: ?NONE ?If you have labs (blood work) drawn today and your tests are completely normal, you will receive your results only by: ?MyChart Message (if you have MyChart) OR ?A paper copy in the mail ?If you have any lab test that is abnormal or we need to change your treatment, we will call you to review the results. ? ? ?Testing/Procedures: ?Your physician has requested that you have a carotid duplex. This test is an ultrasound of the carotid arteries in your neck. It looks at blood flow through these arteries that supply the brain with blood. Allow one hour for this exam. There are no restrictions or special instructions.  ? ?Your physician has requested that you have an echocardiogram. Echocardiography is a painless test that uses sound waves to create images of your heart. It provides your doctor with information about the size and shape of your heart and how well your heart?s chambers and valves are working. This procedure takes approximately one hour. There are no restrictions for this procedure. ? ? ? ?Follow-Up: ?At [facility], you and your health needs are our priority.  As part of our continuing mission to provide you with exceptional heart care, we have created designated Provider Care Teams.  These Care Teams include your primary Cardiologist (physician) and Advanced Practice Providers (APPs -  Physician Assistants and Nurse Practitioners) who all work together to provide you with the care you need, when you need it. ? ? ?Your next appointment:   ?6 month(s) ? ?The format for your next appointment:   ?In Person ? ?Provider:   ?Melina Copa, PA-C, Ermalinda Barrios, PA-C, or Christen Bame, NP     Then, Werner Lean, MD will plan to see you again in 1 year(s).  ? ?

## 2021-04-17 ENCOUNTER — Ambulatory Visit: Payer: Medicare Other | Admitting: Family Medicine

## 2021-04-20 NOTE — Progress Notes (Deleted)
? ? ?HPI: ? ?[name] is a 76 y.o. female, who is here today to follow on recent visit. ?Since her last visit she has seen her endocrinologist and underwent colonoscopy. ? ?Hypertension:  ?Medications:*** ?BP readings at home:*** ?Side effects:*** ? ?Negative for unusual or severe headache, visual changes, exertional chest pain, dyspnea,  focal weakness, or edema. ? ?Lab Results  ?Component Value Date  ? CREATININE 0.7 03/05/2021  ? BUN 11 03/05/2021  ? NA 142 03/05/2021  ? K 4.0 03/05/2021  ? CL 106 03/05/2021  ? CO2 29 (A) 03/05/2021  ? ? ?Review of Systems ?Rest see pertinent positives and negatives per HPI. ? ?Current Outpatient Medications on File Prior to Visit  ?Medication Sig Dispense Refill  ? acetaminophen (TYLENOL) 500 MG tablet Take 1,000 mg by mouth every 6 (six) hours as needed for mild pain or headache.    ? amLODipine (NORVASC) 10 MG tablet TAKE 1 TABLET(10 MG) BY MOUTH DAILY 90 tablet 2  ? atorvastatin (LIPITOR) 80 MG tablet TAKE 1 TABLET(80 MG) BY MOUTH DAILY AT 6 PM 90 tablet 2  ? Blood Glucose Calibration (OT ULTRA/FASTTK CNTRL SOLN) SOLN     ? calcium carbonate (TUMS EX) 750 MG chewable tablet Chew 1 tablet by mouth as needed for heartburn.    ? clopidogrel (PLAVIX) 75 MG tablet TAKE 1 TABLET(75 MG) BY MOUTH DAILY 90 tablet 1  ? Continuous Blood Gluc Sensor (DEXCOM G6 SENSOR) MISC 1 Device by Does not apply route as directed. 3 each 11  ? Continuous Blood Gluc Transmit (DEXCOM G6 TRANSMITTER) MISC 1 Device by Does not apply route as directed. 1 each 3  ? dapagliflozin propanediol (FARXIGA) 10 MG TABS tablet Take 1 tablet (10 mg total) by mouth daily. 90 tablet 3  ? diclofenac Sodium (VOLTAREN) 1 % GEL Apply 2 g topically 4 (four) times daily. (Patient taking differently: Apply 2 g topically as needed (joint pain).) 150 g 3  ? ezetimibe (ZETIA) 10 MG tablet TAKE 1 TABLET(10 MG) BY MOUTH DAILY 90 tablet 3  ? furosemide (LASIX) 20 MG tablet TAKE 1 TABLET(20 MG) BY MOUTH DAILY 30  tablet 2  ? insulin glargine (LANTUS SOLOSTAR) 100 UNIT/ML Solostar Pen Inject 64 Units into the skin daily. 60 mL 3  ? insulin lispro (HUMALOG KWIKPEN) 200 UNIT/ML KwikPen Inject 14 Units into the skin 2 (two) times daily with breakfast and lunch AND 18 Units daily with supper. 15 U 15 min before meals. 30 mL 3  ? Insulin Pen Needle 32G X 4 MM MISC 1 Device by Does not apply route in the morning, at noon, in the evening, and at bedtime. 400 each 3  ? Lancet Devices (SIMPLE DIAGNOSTICS LANCING DEV) MISC Check BS before and 2 hours after meals. 200 each 6  ? liraglutide (VICTOZA) 18 MG/3ML SOPN Inject 1.8 mg into the skin daily. 9 mL 3  ? losartan (COZAAR) 50 MG tablet TAKE 1 TABLET(50 MG) BY MOUTH DAILY 90 tablet 1  ? metoprolol tartrate (LOPRESSOR) 25 MG tablet Take 1 tablet (25 mg total) by mouth 2 (two) times daily. 180 tablet 3  ? Na Sulfate-K Sulfate-Mg Sulf (SUPREP BOWEL PREP KIT) 17.5-3.13-1.6 GM/177ML SOLN Take 1 kit by mouth as directed. For colonoscopy prep 354 mL 0  ? nitroGLYCERIN (NITROSTAT) 0.4 MG SL tablet Place 1 tablet (0.4 mg total) under the tongue every 5 (five) minutes as needed for chest pain. one tab every 5 minutes up to 3 tablets total over 15  minutes. 25 tablet 3  ? ONE TOUCH ULTRA TEST test strip Check BS before and 2 hours after meals. 200 each 6  ? PHARMACIST CHOICE ALCOHOL 70 % PADS     ? ROCKLATAN 0.02-0.005 % SOLN Place 1 drop into both eyes every evening.    ? sertraline (ZOLOFT) 50 MG tablet Take 1 tablet (50 mg total) by mouth daily. 90 tablet 1  ? ?No current facility-administered medications on file prior to visit.  ? ? ?Past Medical History:  ?Diagnosis Date  ? Bilateral carpal tunnel syndrome 03/27/2019  ? Boils   ? Glaucoma   ? Heart murmur   ? HTN (hypertension)   ? Hx of adenomatous colonic polyps   ? Hyperlipidemia   ? Osteoporosis   ? Pneumonia   ? Stroke [facility])   ? Type II or unspecified type diabetes mellitus without mention of complication, uncontrolled   ? ?No Known  Allergies ? ?Social History  ? ?Socioeconomic History  ? Marital status: Single  ?  Spouse name: Not on file  ? Number of children: Not on file  ? Years of education: Not on file  ? Highest education level: Not on file  ?Occupational History  ? Not on file  ?Tobacco Use  ? Smoking status: Former  ?  Types: Cigarettes  ?  Quit date: 10/15/2016  ?  Years since quitting: 4.5  ? Smokeless tobacco: Never  ? Tobacco comments:  ?  smokes occ.   ?Vaping Use  ? Vaping Use: Never used  ?Substance and Sexual Activity  ? Alcohol use: Yes  ?  Alcohol/week: 0.0 standard drinks  ?  Comment: occ  ? Drug use: No  ? Sexual activity: Not on file  ?Other Topics Concern  ? Not on file  ?Social History Narrative  ? Not on file  ? ?Social Determinants of Health  ? ?Financial Resource Strain: Low Risk   ? Difficulty of Paying Living Expenses: Not hard at all  ?Food Insecurity: No Food Insecurity  ? Worried About Charity fundraiser in the Last Year: Never true  ? Ran Out of Food in the Last Year: Never true  ?Transportation Needs: No Transportation Needs  ? Lack of Transportation (Medical): No  ? Lack of Transportation (Non-Medical): No  ?Physical Activity: Inactive  ? Days of Exercise per Week: 0 days  ? Minutes of Exercise per Session: 0 min  ?Stress: No Stress Concern Present  ? Feeling of Stress : Not at all  ?Social Connections: Socially Isolated  ? Frequency of Communication with Friends and Family: More than three times a week  ? Frequency of Social Gatherings with Friends and Family: More than three times a week  ? Attends Religious Services: Never  ? Active Member of Clubs or Organizations: No  ? Attends Archivist Meetings: Never  ? Marital Status: Divorced  ? ? ?There were no vitals filed for this visit. ?There is no height or weight on file to calculate BMI. ? ?Physical Exam ? ?ASSESSMENT AND PLAN: ? ? ?There are no diagnoses linked to this encounter. ? ?No orders of the defined types were placed in this  encounter. ? ? ?No problem-specific Assessment & Plan notes found for this encounter. ? ? ?No follow-ups on file. ? ? ?[name]. [name], MD ? ?[place]. ?[place] office. ? ? ? ? ? ? ? ? ? ? ? ? ? ? ? ?

## 2021-04-21 DIAGNOSIS — Z794 Long term (current) use of insulin: Secondary | ICD-10-CM | POA: Diagnosis not present

## 2021-04-21 DIAGNOSIS — E1165 Type 2 diabetes mellitus with hyperglycemia: Secondary | ICD-10-CM | POA: Diagnosis not present

## 2021-04-22 ENCOUNTER — Ambulatory Visit: Payer: Medicare Other | Admitting: Family Medicine

## 2021-04-22 DIAGNOSIS — I119 Hypertensive heart disease without heart failure: Secondary | ICD-10-CM

## 2021-04-24 ENCOUNTER — Ambulatory Visit (HOSPITAL_COMMUNITY)
Admission: RE | Admit: 2021-04-24 | Discharge: 2021-04-24 | Disposition: A | Discharge status: Home / Self Care | Payer: Medicare Other | Source: Ambulatory Visit | Attending: Internal Medicine | Admitting: Internal Medicine

## 2021-04-24 DIAGNOSIS — R42 Dizziness and giddiness: Secondary | ICD-10-CM | POA: Insufficient documentation

## 2021-04-24 DIAGNOSIS — R0989 Other specified symptoms and signs involving the circulatory and respiratory systems: Secondary | ICD-10-CM | POA: Diagnosis not present

## 2021-04-24 DIAGNOSIS — I6529 Occlusion and stenosis of unspecified carotid artery: Secondary | ICD-10-CM | POA: Insufficient documentation

## 2021-04-27 ENCOUNTER — Ambulatory Visit (HOSPITAL_COMMUNITY): Payer: Medicare Other

## 2021-04-27 ENCOUNTER — Ambulatory Visit (HOSPITAL_COMMUNITY)
Admission: RE | Admit: 2021-04-27 | Discharge: 2021-04-27 | Disposition: A | Discharge status: Home / Self Care | Payer: Medicare Other | Source: Ambulatory Visit | Attending: Internal Medicine | Admitting: Internal Medicine

## 2021-04-27 DIAGNOSIS — I081 Rheumatic disorders of both mitral and tricuspid valves: Secondary | ICD-10-CM | POA: Insufficient documentation

## 2021-04-27 DIAGNOSIS — E119 Type 2 diabetes mellitus without complications: Secondary | ICD-10-CM | POA: Insufficient documentation

## 2021-04-27 DIAGNOSIS — I6529 Occlusion and stenosis of unspecified carotid artery: Secondary | ICD-10-CM | POA: Diagnosis not present

## 2021-04-27 DIAGNOSIS — I3139 Other pericardial effusion (noninflammatory): Secondary | ICD-10-CM | POA: Diagnosis not present

## 2021-04-27 DIAGNOSIS — I119 Hypertensive heart disease without heart failure: Secondary | ICD-10-CM | POA: Diagnosis not present

## 2021-04-27 DIAGNOSIS — R011 Cardiac murmur, unspecified: Secondary | ICD-10-CM | POA: Diagnosis not present

## 2021-04-27 DIAGNOSIS — E785 Hyperlipidemia, unspecified: Secondary | ICD-10-CM | POA: Insufficient documentation

## 2021-04-27 LAB — ECHOCARDIOGRAM COMPLETE
Area-P 1/2: 3.91 cm2
S' Lateral: 2.1 cm

## 2021-04-27 NOTE — Progress Notes (Signed)
?  Echocardiogram ?2D Echocardiogram has been performed. ? ?Sarah [name] ?04/27/2021, 3:22 PM ?

## 2021-04-28 ENCOUNTER — Other Ambulatory Visit (HOSPITAL_COMMUNITY): Payer: Medicare Other

## 2021-04-28 NOTE — Progress Notes (Signed)
? ?[name] is a 76 y.o.female with history of CKD 3, CVA, DM 2, PAD, diastolic dysfunction, and hypertension here today for follow-up. ?Since her last visit she has seen nephrologist and cardiologist.  S ?he also had colonoscopy done. ? ?Last follow up visit: 11/17/20 ?Hypertension:  ?Medications: Losartan 50 mg daily, metoprolol titrate 25 mg twice daily, and Amlodipine 10 mg daily ?BP readings at home:130's/60's. ?Side effects:None. ?Negative for unusual or severe headache, visual changes, exertional chest pain, dyspnea,  or new focal weakness. ?CVA and unstable gait, completed PT. ? ?Lab Results  ?Component Value Date  ? CREATININE 0.7 03/05/2021  ? BUN 11 03/05/2021  ? NA 142 03/05/2021  ? K 4.0 03/05/2021  ? CL 106 03/05/2021  ? CO2 29 (A) 03/05/2021  ? ?Sleeping about 9 hours but gets up 3-4 times to urinate. ?Incontinence of urine, urgency, chronic. ?Concerned about LE edema, present when getting up and worse at the end of the day. ?Just got compression stockings. ?Negative for orthopnea or PND. ?She is on furosemide 20 mg daily. ? ?She was last evaluated by nephrologist in 02/2021. Concerned about proteinuria getting worse and LE edema, renal Bx was recommended. ?+ Foam in urine, negative for gross hematuria. ? ?Today she is complaining of fatigue. ?Sleeps about 8 to 9 hours but gets up about 3-4 times throughout the night to go to the bathroom to urinate. ?No known sleep apnea. ? ?Lab Results  ?Component Value Date  ? TSH 1.67 08/01/2019  ? ?Anxiety on sertraline 50 mg daily, which she thinks is helping.  She is dealing well with the stress. ?Appointment with her endocrinologist on 05/01/21. ? ?Review of Systems  ?Constitutional:  Negative for activity change, appetite change and fever.  ?HENT:  Negative for mouth sores, nosebleeds and sore throat.   ?Respiratory:  Negative for cough and wheezing.   ?Gastrointestinal:  Negative for abdominal pain, nausea and vomiting.  ?     Negative for  changes in bowel habits.  ?Genitourinary:  Negative for decreased urine volume and dysuria.  ?Musculoskeletal:  Positive for arthralgias and gait problem.  ?Skin:  Negative for pallor and rash.  ?Neurological:  Negative for syncope and facial asymmetry.  ?Rest see pertinent positives and negatives per HPI. ? ?Current Outpatient Medications on File Prior to Visit  ?Medication Sig Dispense Refill  ? acetaminophen (TYLENOL) 500 MG tablet Take 1,000 mg by mouth every 6 (six) hours as needed for mild pain or headache.    ? amLODipine (NORVASC) 10 MG tablet TAKE 1 TABLET(10 MG) BY MOUTH DAILY 90 tablet 2  ? atorvastatin (LIPITOR) 80 MG tablet TAKE 1 TABLET(80 MG) BY MOUTH DAILY AT 6 PM 90 tablet 2  ? Blood Glucose Calibration (OT ULTRA/FASTTK CNTRL SOLN) SOLN     ? calcium carbonate (TUMS EX) 750 MG chewable tablet Chew 1 tablet by mouth as needed for heartburn.    ? clopidogrel (PLAVIX) 75 MG tablet TAKE 1 TABLET(75 MG) BY MOUTH DAILY 90 tablet 1  ? Continuous Blood Gluc Sensor (DEXCOM G6 SENSOR) MISC 1 Device by Does not apply route as directed. 3 each 11  ? Continuous Blood Gluc Transmit (DEXCOM G6 TRANSMITTER) MISC 1 Device by Does not apply route as directed. 1 each 3  ? dapagliflozin propanediol (FARXIGA) 10 MG TABS tablet Take 1 tablet (10 mg total) by mouth daily. 90 tablet 3  ? diclofenac Sodium (VOLTAREN) 1 % GEL Apply 2 g topically 4 (four) times daily. (Patient taking differently:  Apply 2 g topically as needed (joint pain).) 150 g 3  ? ezetimibe (ZETIA) 10 MG tablet TAKE 1 TABLET(10 MG) BY MOUTH DAILY 90 tablet 3  ? furosemide (LASIX) 20 MG tablet TAKE 1 TABLET(20 MG) BY MOUTH DAILY 30 tablet 2  ? insulin glargine (LANTUS SOLOSTAR) 100 UNIT/ML Solostar Pen Inject 64 Units into the skin daily. 60 mL 3  ? insulin lispro (HUMALOG KWIKPEN) 200 UNIT/ML KwikPen Inject 14 Units into the skin 2 (two) times daily with breakfast and lunch AND 18 Units daily with supper. 15 U 15 min before meals. 30 mL 3  ? Insulin Pen  Needle 32G X 4 MM MISC 1 Device by Does not apply route in the morning, at noon, in the evening, and at bedtime. 400 each 3  ? Lancet Devices (SIMPLE DIAGNOSTICS LANCING DEV) MISC Check BS before and 2 hours after meals. 200 each 6  ? liraglutide (VICTOZA) 18 MG/3ML SOPN Inject 1.8 mg into the skin daily. 9 mL 3  ? losartan (COZAAR) 50 MG tablet TAKE 1 TABLET(50 MG) BY MOUTH DAILY 90 tablet 1  ? Na Sulfate-K Sulfate-Mg Sulf (SUPREP BOWEL PREP KIT) 17.5-3.13-1.6 GM/177ML SOLN Take 1 kit by mouth as directed. For colonoscopy prep 354 mL 0  ? nitroGLYCERIN (NITROSTAT) 0.4 MG SL tablet Place 1 tablet (0.4 mg total) under the tongue every 5 (five) minutes as needed for chest pain. one tab every 5 minutes up to 3 tablets total over 15 minutes. 25 tablet 3  ? ONE TOUCH ULTRA TEST test strip Check BS before and 2 hours after meals. 200 each 6  ? PHARMACIST CHOICE ALCOHOL 70 % PADS     ? ROCKLATAN 0.02-0.005 % SOLN Place 1 drop into both eyes every evening.    ? sertraline (ZOLOFT) 50 MG tablet Take 1 tablet (50 mg total) by mouth daily. 90 tablet 1  ? metoprolol tartrate (LOPRESSOR) 25 MG tablet Take 1 tablet (25 mg total) by mouth 2 (two) times daily. 180 tablet 3  ? ?No current facility-administered medications on file prior to visit.  ? ?Past Medical History:  ?Diagnosis Date  ? Bilateral carpal tunnel syndrome 03/27/2019  ? Boils   ? Glaucoma   ? Heart murmur   ? HTN (hypertension)   ? Hx of adenomatous colonic polyps   ? Hyperlipidemia   ? Osteoporosis   ? Pneumonia   ? Stroke [facility])   ? Type II or unspecified type diabetes mellitus without mention of complication, uncontrolled   ? ?No Known Allergies ? ?Social History  ? ?Socioeconomic History  ? Marital status: Single  ?  Spouse name: Not on file  ? Number of children: Not on file  ? Years of education: Not on file  ? Highest education level: Not on file  ?Occupational History  ? Not on file  ?Tobacco Use  ? Smoking status: Former  ?  Types: Cigarettes  ?  Quit date:  10/15/2016  ?  Years since quitting: 4.5  ? Smokeless tobacco: Never  ? Tobacco comments:  ?  smokes occ.   ?Vaping Use  ? Vaping Use: Never used  ?Substance and Sexual Activity  ? Alcohol use: Yes  ?  Alcohol/week: 0.0 standard drinks  ?  Comment: occ  ? Drug use: No  ? Sexual activity: Not on file  ?Other Topics Concern  ? Not on file  ?Social History Narrative  ? Not on file  ? ?Social Determinants of Health  ? ?Financial Resource Strain:  Low Risk   ? Difficulty of Paying Living Expenses: Not hard at all  ?Food Insecurity: No Food Insecurity  ? Worried About Charity fundraiser in the Last Year: Never true  ? Ran Out of Food in the Last Year: Never true  ?Transportation Needs: No Transportation Needs  ? Lack of Transportation (Medical): No  ? Lack of Transportation (Non-Medical): No  ?Physical Activity: Insufficiently Active  ? Days of Exercise per Week: 1 day  ? Minutes of Exercise per Session: 30 min  ?Stress: No Stress Concern Present  ? Feeling of Stress : Not at all  ?Social Connections: Socially Isolated  ? Frequency of Communication with Friends and Family: More than three times a week  ? Frequency of Social Gatherings with Friends and Family: More than three times a week  ? Attends Religious Services: Never  ? Active Member of Clubs or Organizations: No  ? Attends Archivist Meetings: Never  ? Marital Status: Never married  ? ?Vitals:  ? 04/29/21 0827  ?BP: 128/70  ?Pulse: 87  ?Resp: 16  ?SpO2: 97%  ?Body mass index is 25.83 kg/m?Marland Kitchen ?Physical Exam ?Vitals and nursing note reviewed.  ?Constitutional:   ?   General: She is not in acute distress. ?   Appearance: She is well-developed.  ?HENT:  ?   Head: Normocephalic and atraumatic.  ?   Mouth/Throat:  ?   Mouth: Mucous membranes are moist.  ?Eyes:  ?   Conjunctiva/sclera: Conjunctivae normal.  ?Cardiovascular:  ?   Rate and Rhythm: Normal rate and regular rhythm.  ?   Heart sounds: Murmur (SEM I/VI RUSB) heard.  ?   Comments: DP pulses  present. ?Pulmonary:  ?   Effort: Pulmonary effort is normal. No respiratory distress.  ?   Breath sounds: Normal breath sounds.  ?Abdominal:  ?   Palpations: Abdomen is soft. There is no hepatomegaly or mass.  ?   Tend

## 2021-04-29 ENCOUNTER — Encounter: Payer: Self-pay | Admitting: Family Medicine

## 2021-04-29 ENCOUNTER — Ambulatory Visit (INDEPENDENT_AMBULATORY_CARE_PROVIDER_SITE_OTHER): Payer: Medicare Other

## 2021-04-29 ENCOUNTER — Ambulatory Visit (INDEPENDENT_AMBULATORY_CARE_PROVIDER_SITE_OTHER): Payer: Medicare Other | Admitting: Family Medicine

## 2021-04-29 VITALS — BP 128/70 | HR 87 | Resp 16 | Ht 60.0 in | Wt 132.2 lb

## 2021-04-29 VITALS — BP 128/70 | HR 87 | Temp 97.0°F | Ht 60.0 in | Wt 132.0 lb

## 2021-04-29 DIAGNOSIS — E1165 Type 2 diabetes mellitus with hyperglycemia: Secondary | ICD-10-CM

## 2021-04-29 DIAGNOSIS — R5383 Other fatigue: Secondary | ICD-10-CM

## 2021-04-29 DIAGNOSIS — I119 Hypertensive heart disease without heart failure: Secondary | ICD-10-CM

## 2021-04-29 DIAGNOSIS — R600 Localized edema: Secondary | ICD-10-CM | POA: Diagnosis not present

## 2021-04-29 DIAGNOSIS — Z0000 Encounter for general adult medical examination without abnormal findings: Secondary | ICD-10-CM | POA: Diagnosis not present

## 2021-04-29 DIAGNOSIS — Z794 Long term (current) use of insulin: Secondary | ICD-10-CM

## 2021-04-29 DIAGNOSIS — F419 Anxiety disorder, unspecified: Secondary | ICD-10-CM

## 2021-04-29 NOTE — Assessment & Plan Note (Signed)
Problem is not well controlled. ?We discussed possible complications of elevated glucose. ?Following with endocrinologist. ?

## 2021-04-29 NOTE — Patient Instructions (Addendum)
A few things to remember from today's visit: ? ?Hypertension with heart disease ? ?Anxiety disorder, unspecified type ? ?Bilateral lower extremity edema ? ?Fatigue, unspecified type ? ?If you need refills please call your pharmacy. ?Do not use My Chart to request refills or for acute issues that need immediate attention. ?  ?No changes today. ?Start wearing compression stockings to help with edema. ?Try to avoid fluid intake 3-4 hours before bedtime and see if you can sleep better, may help with fatigue. ? ?As far as you are following with endocrinologist,cardiologist,and nephrologist a few times per year I think it is fine to see you here in the office annually, before if you have a new concern. ? ?Please be sure medication list is accurate. ?If a new problem present, please set up appointment sooner than planned today. ? ? ? ? ? ? ? ?

## 2021-04-29 NOTE — Progress Notes (Signed)
? ?Subjective:  ? Sarah [name] is a 76 y.o. female who presents for Medicare Annual (Subsequent) preventive examination. ? ?Review of Systems    ? ? ?   ?Objective:  ?  ?Today's Vitals  ? 04/29/21 0926  ?BP: 128/70  ?Pulse: 87  ?Temp: (!) 97 ?F (36.1 ?C)  ?TempSrc: Oral  ?Weight: 132 lb (59.9 kg)  ?Height: 5' (1.524 m)  ? ?Body mass index is 25.78 kg/m?. ? ? ?  04/29/2021  ?  9:48 AM 09/26/2020  ?  9:39 AM 04/18/2020  ?  8:44 AM 03/11/2020  ?  5:40 AM 10/01/2019  ?  2:15 PM 05/11/2019  ? 11:07 AM 06/23/2018  ?  3:38 PM  ?Advanced Directives  ?Does Patient Have a Medical Advance Directive? No No No No No No No  ?Would patient like information on creating a medical advance directive? No - Patient declined No - Patient declined No - Patient declined No - Patient declined No - Patient declined No - Patient declined No - Patient declined  ? ? ?Current Medications (verified) ?Outpatient Encounter Medications as of 04/29/2021  ?Medication Sig  ? acetaminophen (TYLENOL) 500 MG tablet Take 1,000 mg by mouth every 6 (six) hours as needed for mild pain or headache.  ? amLODipine (NORVASC) 10 MG tablet TAKE 1 TABLET(10 MG) BY MOUTH DAILY  ? atorvastatin (LIPITOR) 80 MG tablet TAKE 1 TABLET(80 MG) BY MOUTH DAILY AT 6 PM  ? Blood Glucose Calibration (OT ULTRA/FASTTK CNTRL SOLN) SOLN   ? calcium carbonate (TUMS EX) 750 MG chewable tablet Chew 1 tablet by mouth as needed for heartburn.  ? clopidogrel (PLAVIX) 75 MG tablet TAKE 1 TABLET(75 MG) BY MOUTH DAILY  ? Continuous Blood Gluc Sensor (DEXCOM G6 SENSOR) MISC 1 Device by Does not apply route as directed.  ? Continuous Blood Gluc Transmit (DEXCOM G6 TRANSMITTER) MISC 1 Device by Does not apply route as directed.  ? dapagliflozin propanediol (FARXIGA) 10 MG TABS tablet Take 1 tablet (10 mg total) by mouth daily.  ? diclofenac Sodium (VOLTAREN) 1 % GEL Apply 2 g topically 4 (four) times daily. (Patient taking differently: Apply 2 g topically as needed (joint pain).)  ? ezetimibe  (ZETIA) 10 MG tablet TAKE 1 TABLET(10 MG) BY MOUTH DAILY  ? furosemide (LASIX) 20 MG tablet TAKE 1 TABLET(20 MG) BY MOUTH DAILY  ? insulin glargine (LANTUS SOLOSTAR) 100 UNIT/ML Solostar Pen Inject 64 Units into the skin daily.  ? insulin lispro (HUMALOG KWIKPEN) 200 UNIT/ML KwikPen Inject 14 Units into the skin 2 (two) times daily with breakfast and lunch AND 18 Units daily with supper. 15 U 15 min before meals.  ? Insulin Pen Needle 32G X 4 MM MISC 1 Device by Does not apply route in the morning, at noon, in the evening, and at bedtime.  ? Lancet Devices (SIMPLE DIAGNOSTICS LANCING DEV) MISC Check BS before and 2 hours after meals.  ? liraglutide (VICTOZA) 18 MG/3ML SOPN Inject 1.8 mg into the skin daily.  ? losartan (COZAAR) 50 MG tablet TAKE 1 TABLET(50 MG) BY MOUTH DAILY  ? metoprolol tartrate (LOPRESSOR) 25 MG tablet Take 1 tablet (25 mg total) by mouth 2 (two) times daily.  ? Na Sulfate-K Sulfate-Mg Sulf (SUPREP BOWEL PREP KIT) 17.5-3.13-1.6 GM/177ML SOLN Take 1 kit by mouth as directed. For colonoscopy prep  ? nitroGLYCERIN (NITROSTAT) 0.4 MG SL tablet Place 1 tablet (0.4 mg total) under the tongue every 5 (five) minutes as needed for chest pain. one tab  every 5 minutes up to 3 tablets total over 15 minutes.  ? ONE TOUCH ULTRA TEST test strip Check BS before and 2 hours after meals.  ? PHARMACIST CHOICE ALCOHOL 70 % PADS   ? ROCKLATAN 0.02-0.005 % SOLN Place 1 drop into both eyes every evening.  ? sertraline (ZOLOFT) 50 MG tablet Take 1 tablet (50 mg total) by mouth daily.  ? ?No facility-administered encounter medications on file as of 04/29/2021.  ? ? ?Allergies (verified) ?Patient has no known allergies.  ? ?History: ?Past Medical History:  ?Diagnosis Date  ? Bilateral carpal tunnel syndrome 03/27/2019  ? Boils   ? Glaucoma   ? Heart murmur   ? HTN (hypertension)   ? Hx of adenomatous colonic polyps   ? Hyperlipidemia   ? Osteoporosis   ? Pneumonia   ? Stroke [facility])   ? Type II or unspecified type diabetes  mellitus without mention of complication, uncontrolled   ? ?Past Surgical History:  ?Procedure Laterality Date  ? ABDOMINAL HYSTERECTOMY    ? CARPAL TUNNEL RELEASE    ? right  ? COLONOSCOPY  12-10-10  ? per Dr. Deatra Ina, clear, repeat in 7 yrs   ? CORONARY STENT INTERVENTION N/A 03/11/2020  ? Procedure: CORONARY STENT INTERVENTION;  Surgeon: Jettie Booze, MD;  Location: [place] [facility];  Service: Cardiovascular;  Laterality: N/A;  ? INCISION AND DRAINAGE PERIRECTAL ABSCESS N/A 09/26/2015  ? Procedure: IRRIGATION AND DEBRIDEMENT PERIRECTAL ABSCESS;  Surgeon: Mickeal Skinner, MD;  Location: [place];  Service: General;  Laterality: N/A;  ? INTRAVASCULAR ULTRASOUND/IVUS N/A 03/11/2020  ? Procedure: Intravascular Ultrasound/IVUS;  Surgeon: Jettie Booze, MD;  Location: [place] [facility];  Service: Cardiovascular;  Laterality: N/A;  ? KNEE ARTHROSCOPY    ? right  ? LEFT HEART CATH AND CORONARY ANGIOGRAPHY N/A 03/11/2020  ? Procedure: LEFT HEART CATH AND CORONARY ANGIOGRAPHY;  Surgeon: Jettie Booze, MD;  Location: [place] [facility];  Service: Cardiovascular;  Laterality: N/A;  ? LOOP RECORDER INSERTION N/A 10/19/2016  ? Procedure: LOOP RECORDER INSERTION;  Surgeon: Thompson Grayer, MD;  Location: [place] [facility];  Service: Cardiovascular;  Laterality: N/A;  ? ORIF ANKLE FRACTURE Right 03/24/2012  ? Procedure: OPEN REDUCTION INTERNAL FIXATION (ORIF) ANKLE FRACTURE;  Surgeon: Newt Minion, MD;  Location: [place];  Service: Orthopedics;  Laterality: Right;  Open Reduction Internal Fixation Right Bimalleolar ankle fracture  ? POLYPECTOMY    ? TEE WITHOUT CARDIOVERSION N/A 10/18/2016  ? Procedure: TRANSESOPHAGEAL ECHOCARDIOGRAM (TEE);  Surgeon: Fay Records, MD;  Location: [place];  Service: Cardiovascular;  Laterality: N/A;  ? TONSILLECTOMY    ? ?Family History  ?Problem Relation Age of Onset  ? Diabetes Mother   ? Hypertension Mother   ? Stroke Father   ? Stroke Maternal Uncle   ? Stroke Paternal  Uncle   ? Colon cancer Neg Hx   ? Esophageal cancer Neg Hx   ? Rectal cancer Neg Hx   ? Stomach cancer Neg Hx   ? Inflammatory bowel disease Neg Hx   ? Liver disease Neg Hx   ? Pancreatic cancer Neg Hx   ? ?Social History  ? ?Socioeconomic History  ? Marital status: Single  ?  Spouse name: Not on file  ? Number of children: Not on file  ? Years of education: Not on file  ? Highest education level: Not on file  ?Occupational History  ? Not on file  ?Tobacco Use  ? Smoking  status: Former  ?  Types: Cigarettes  ?  Quit date: 10/15/2016  ?  Years since quitting: 4.5  ? Smokeless tobacco: Never  ? Tobacco comments:  ?  smokes occ.   ?Vaping Use  ? Vaping Use: Never used  ?Substance and Sexual Activity  ? Alcohol use: Yes  ?  Alcohol/week: 0.0 standard drinks  ?  Comment: occ  ? Drug use: No  ? Sexual activity: Not on file  ?Other Topics Concern  ? Not on file  ?Social History Narrative  ? Not on file  ? ?Social Determinants of Health  ? ?Financial Resource Strain: Low Risk   ? Difficulty of Paying Living Expenses: Not hard at all  ?Food Insecurity: No Food Insecurity  ? Worried About Charity fundraiser in the Last Year: Never true  ? Ran Out of Food in the Last Year: Never true  ?Transportation Needs: No Transportation Needs  ? Lack of Transportation (Medical): No  ? Lack of Transportation (Non-Medical): No  ?Physical Activity: Insufficiently Active  ? Days of Exercise per Week: 1 day  ? Minutes of Exercise per Session: 30 min  ?Stress: No Stress Concern Present  ? Feeling of Stress : Not at all  ?Social Connections: Socially Isolated  ? Frequency of Communication with Friends and Family: More than three times a week  ? Frequency of Social Gatherings with Friends and Family: More than three times a week  ? Attends Religious Services: Never  ? Active Member of Clubs or Organizations: No  ? Attends Archivist Meetings: Never  ? Marital Status: Never married  ? ? ?Tobacco Counseling ?Counseling given: Not  Answered ?Tobacco comments: smokes occ.  ? ? ?Clinical Intake: ?Nutrition Risk Assessment: ? ?Has the patient had any N/V/D within the last 2 months?  No  ?Does the patient have any non-healing wounds?  No  ?Has the

## 2021-04-29 NOTE — Assessment & Plan Note (Addendum)
Associated proteinuria. ?Amlodipine could be aggravating problem. ?Continue furosemide same dose. ?Low salt diet. ?Recommend starting wearing compression stockings. ?Appropriate skin care. ?Follow-up with nephrologist. ?

## 2021-04-29 NOTE — Assessment & Plan Note (Signed)
BP adequately controlled. ?Continue current management:  Losartan 50 mg daily, metoprolol titrate 25 mg twice daily, and Amlodipine 10 mg daily ?DASH/low salt diet recommended. ?Continue monitoring BP at home. ?Eye exam up-to-date. ?

## 2021-04-29 NOTE — Patient Instructions (Addendum)
?[name] , ?Thank you for taking time to come for your Medicare Wellness Visit. I appreciate your ongoing commitment to your health goals. Please review the following plan we discussed and let me know if I can assist you in the future.  ? ?These are the goals we discussed: ? Goals   ? ?   Chronic Care Management   ?   CARE PLAN ENTRY ? ?Current Barriers:  ?Chronic Disease Management support, education, and care coordination needs related to Hypertension, Hyperlipidemia, and Diabetes ?  ?Hypertension ?Pharmacist Clinical Goal(s): ?Over the next 30 days, patient will work with PharmD and providers to achieve BP goal <130/80 ?Current regimen:  ?Amlodipine 10 mg 2 tablets daily AM ?Losartan 50 mg 1 tablet daily AM ?Interventions: ?Discussed the importance of low salt diet and increase physical activity ?Patient self care activities - Over the next 30 days, patient will: ?Check BP once daily, document, and provide at future appointments ?Ensure daily salt intake < 2300 mg/day ?Continue to walk outside 4 days a week and follow weekly recipe ? ?Hyperlipidemia ?Pharmacist Clinical Goal(s): ?Over the next 30 days, patient will work with PharmD and providers to achieve LDL goal < 100 ?Current regimen:  ?Atorvastatin 80 mg 1 tablet daily HS  ?Ezetimibe 10 mg 1 tablet daily AM ?Interventions: ?Discussed the importance of heart healthy diet and increase physical activity ?Patient self care activities - Over the next 30 days, patient will: ?Continue to walk 4 days a week and follow weekly recipe ? ?Diabetes ?Pharmacist Clinical Goal(s): ?Over the next 30 days, patient will work with PharmD and providers to achieve A1c goal <7% ?Current regimen:  ?Lantus 100units/mL 35 units AM and 30 units every 12 hrs (breakfast and dinner) ?Humalog 100 units/mL kwikpen 5-10 units 30 mins before meals  ?Metformin 500 mg 1 tablet BID with a meal  ?Victoza '18mg'$ /5m inject 1.8 mg daily at lunch ?Interventions: ?Encourage to increase physical  activity ?Benefits of switching from regular soda to diet soda ?Patient self care activities - Over the next 30 days, patient will: ?Check blood sugar 3-4 times daily, document, and provide at future appointments ?Contact provider with any episodes of hypoglycemia ? ?Medication management ?Pharmacist Clinical Goal(s): ?Over the next 30 days, patient will work with PharmD and providers to maintain optimal medication adherence ?Current pharmacy: [facility] ?Interventions ?Comprehensive medication review performed. ?Continue current medication management strategy ?Patient self care activities - Over the next 30 days, patient will: ?Focus on medication adherence by pill boxes ?Take medications as prescribed ?Report any questions or concerns to PharmD and/or provider(s) ? ?Initial goal documentation  ?  ?   Decrease inpatient admissions/ readmissions with in the next year   ?   Decrease inpatient diabetes admissions/readmissions with in the year   ?   Decrease the use of hospital emergency department related to diabetes within the next year    ?   Increase physical activity (pt-stated)   ?   Walk more. ?  ?   Patient Stated   ?   I would like to get dental procedures done.  ?  ? ?  ?  ?This is a list of the screening recommended for you and due dates:  ?Health Maintenance  ?Topic Date Due  ? Eye exam for diabetics  08/21/2019  ? Complete foot exam   08/14/2020  ? COVID-19 Vaccine (4 - Booster for Pfizer series) 05/15/2021*  ? Zoster (Shingles) Vaccine (1 of 2) 07/29/2021*  ? Hemoglobin A1C  06/25/2021  ?  Flu Shot  08/11/2021  ? Mammogram  11/27/2021  ? Colon Cancer Screening  02/28/2028  ? Pneumonia Vaccine  Completed  ? DEXA scan (bone density measurement)  Completed  ? Hepatitis C Screening: USPSTF Recommendation to screen - Ages 34-79 yo.  Completed  ? HPV Vaccine  Aged Out  ? Tetanus Vaccine  Discontinued  ?*Topic was postponed. The date shown is not the original due date.  ? ?Advanced directives: No Patient  deferred ? ?Conditions/risks identified: None ? ?Next appointment: Follow up in one year for your annual wellness visit  ? ? ?Preventive Care 22 Years and Older, Female ?Preventive care refers to lifestyle choices and visits with your health care provider that can promote health and wellness. ?What does preventive care include? ?A yearly physical exam. This is also called an annual well check. ?Dental exams once or twice a year. ?Routine eye exams. Ask your health care provider how often you should have your eyes checked. ?Personal lifestyle choices, including: ?Daily care of your teeth and gums. ?Regular physical activity. ?Eating a healthy diet. ?Avoiding tobacco and drug use. ?Limiting alcohol use. ?Practicing safe sex. ?Taking low-dose aspirin every day. ?Taking vitamin and mineral supplements as recommended by your health care provider. ?What happens during an annual well check? ?The services and screenings done by your health care provider during your annual well check will depend on your age, overall health, lifestyle risk factors, and family history of disease. ?Counseling  ?Your health care provider may ask you questions about your: ?Alcohol use. ?Tobacco use. ?Drug use. ?Emotional well-being. ?Home and relationship well-being. ?Sexual activity. ?Eating habits. ?History of falls. ?Memory and ability to understand (cognition). ?Work and work Statistician. ?Reproductive health. ?Screening  ?You may have the following tests or measurements: ?Height, weight, and BMI. ?Blood pressure. ?Lipid and cholesterol levels. These may be checked every 5 years, or more frequently if you are over 69 years old. ?Skin check. ?Lung cancer screening. You may have this screening every year starting at age 69 if you have a 30-pack-year history of smoking and currently smoke or have quit within the past 15 years. ?Fecal occult blood test (FOBT) of the stool. You may have this test every year starting at age 60. ?Flexible  sigmoidoscopy or colonoscopy. You may have a sigmoidoscopy every 5 years or a colonoscopy every 10 years starting at age 69. ?Hepatitis C blood test. ?Hepatitis B blood test. ?Sexually transmitted disease (STD) testing. ?Diabetes screening. This is done by checking your blood sugar (glucose) after you have not eaten for a while (fasting). You may have this done every 1-3 years. ?Bone density scan. This is done to screen for osteoporosis. You may have this done starting at age 87. ?Mammogram. This may be done every 1-2 years. Talk to your health care provider about how often you should have regular mammograms. ?Talk with your health care provider about your test results, treatment options, and if necessary, the need for more tests. ?Vaccines  ?Your health care provider may recommend certain vaccines, such as: ?Influenza vaccine. This is recommended every year. ?Tetanus, diphtheria, and acellular pertussis (Tdap, Td) vaccine. You may need a Td booster every 10 years. ?Zoster vaccine. You may need this after age 23. ?Pneumococcal 13-valent conjugate (PCV13) vaccine. One dose is recommended after age 86. ?Pneumococcal polysaccharide (PPSV23) vaccine. One dose is recommended after age 60. ?Talk to your health care provider about which screenings and vaccines you need and how often you need them. ?This information is not intended  to replace advice given to you by your health care provider. Make sure you discuss any questions you have with your health care provider. ?Document Released: 01/24/2015 Document Revised: 09/17/2015 Document Reviewed: 10/29/2014 ?Elsevier Interactive Patient Education ? 2017 [place]. ? ?Fall Prevention in the Home ?Falls can cause injuries. They can happen to people of all ages. There are many things you can do to make your home safe and to help prevent falls. ?What can I do on the outside of my home? ?Regularly fix the edges of walkways and driveways and fix any cracks. ?Remove anything that  might make you trip as you walk through a door, such as a raised step or threshold. ?Trim any bushes or trees on the path to your home. ?Use bright outdoor lighting. ?Clear any walking paths of anything that might mak

## 2021-04-29 NOTE — Assessment & Plan Note (Signed)
She is reporting problem as well controlled. ?Continue sertraline 50 mg daily. ?As far as problem is stable, it is appropriate to follow annually. ?

## 2021-04-30 ENCOUNTER — Ambulatory Visit: Payer: Medicare Other | Admitting: Internal Medicine

## 2021-05-01 ENCOUNTER — Ambulatory Visit (INDEPENDENT_AMBULATORY_CARE_PROVIDER_SITE_OTHER): Payer: Medicare Other | Admitting: Internal Medicine

## 2021-05-01 ENCOUNTER — Encounter: Payer: Self-pay | Admitting: Internal Medicine

## 2021-05-01 VITALS — BP 140/80 | HR 87 | Ht 60.0 in | Wt 132.4 lb

## 2021-05-01 DIAGNOSIS — R739 Hyperglycemia, unspecified: Secondary | ICD-10-CM

## 2021-05-01 DIAGNOSIS — E1165 Type 2 diabetes mellitus with hyperglycemia: Secondary | ICD-10-CM | POA: Diagnosis not present

## 2021-05-01 DIAGNOSIS — E1142 Type 2 diabetes mellitus with diabetic polyneuropathy: Secondary | ICD-10-CM | POA: Diagnosis not present

## 2021-05-01 DIAGNOSIS — E1159 Type 2 diabetes mellitus with other circulatory complications: Secondary | ICD-10-CM

## 2021-05-01 DIAGNOSIS — E1121 Type 2 diabetes mellitus with diabetic nephropathy: Secondary | ICD-10-CM | POA: Diagnosis not present

## 2021-05-01 DIAGNOSIS — Z794 Long term (current) use of insulin: Secondary | ICD-10-CM

## 2021-05-01 LAB — POCT GLYCOSYLATED HEMOGLOBIN (HGB A1C): HbA1c POC (<> result, manual entry): >15 % (ref 4.0–5.6)

## 2021-05-01 LAB — POCT GLUCOSE (DEVICE FOR HOME USE): Glucose Fasting, POC: 500 mg/dL — AB (ref 70–99)

## 2021-05-01 MED ORDER — VICTOZA 18 MG/3ML ~~LOC~~ SOPN: 1.8000 mg | PEN_INJECTOR | Freq: Every day | SUBCUTANEOUS | 3 refills | Status: DC

## 2021-05-01 MED ORDER — INSULIN PEN NEEDLE 32G X 4 MM MISC: 1.0000 | Freq: Four times a day (QID) | 3 refills | Status: DC

## 2021-05-01 MED ORDER — DAPAGLIFLOZIN PROPANEDIOL 10 MG PO TABS: 10.0000 mg | ORAL_TABLET | Freq: Every day | ORAL | 3 refills | Status: DC

## 2021-05-01 MED ORDER — HUMALOG KWIKPEN 200 UNIT/ML ~~LOC~~ SOPN: PEN_INJECTOR | SUBCUTANEOUS | 3 refills | Status: DC

## 2021-05-01 MED ORDER — LANTUS SOLOSTAR 100 UNIT/ML ~~LOC~~ SOPN: 70.0000 [IU] | PEN_INJECTOR | Freq: Every day | SUBCUTANEOUS | 6 refills | Status: DC

## 2021-05-01 NOTE — Patient Instructions (Addendum)
Please check out insulin pump ( OMNIPOD 5)  ? ? ? ?- Continue  Farxiga 10 mg, 1 tablet daily  ?- Continue Victoza 1.8 mg daily  ?- Increase Lantus to 70  units daily  ?- Continue   Humalog 14 units with each meal  ?- Humalog correctional insulin: ADD extra units on insulin to your meal-time Humalog dose if your blood sugars are higher than 160. Use the scale below to help guide you:  ? ?Blood sugar before meal Number of units to inject  ?Less than 160 0 unit  ?161 -  190 1 units  ?191 -  220 2 units  ?221 -  250 3 units  ?251 -  280 4 units  ?281 -  310 5 units  ?311 -  340 6 units  ?341 -  370 7 units  ?371 -  400 8 units  ?401 - 430 9 units   ? ? ? ? ? ?HOW TO TREAT LOW BLOOD SUGARS (Blood sugar LESS THAN 70 MG/DL) ?Please follow the RULE OF 15 for the treatment of hypoglycemia treatment (when your (blood sugars are less than 70 mg/dL)  ? ?STEP 1: Take 15 grams of carbohydrates when your blood sugar is low, which includes:  ?3-4 GLUCOSE TABS  OR ?3-4 OZ OF JUICE OR REGULAR SODA OR ?ONE TUBE OF GLUCOSE GEL   ? ?STEP 2: RECHECK blood sugar in 15 MINUTES ?STEP 3: If your blood sugar is still low at the 15 minute recheck --> then, go back to STEP 1 and treat AGAIN with another 15 grams of carbohydrates. ? ?

## 2021-05-01 NOTE — Progress Notes (Signed)
?Name: Sarah [name]  ?Age/ Sex: 76 y.o., female   ?MRN/ DOB: [id number], 1945/05/02    ? ?PCP: [name], Sarah [name]   ?Reason for Endocrinology Evaluation: Type 2 Diabetes Mellitus  ?Initial Endocrine Consultative Visit: 08/15/2019  ? ? ?PATIENT IDENTIFIER: [name] is a 76 y.o. female with a past medical history of T2DM, CVA, Dyslipidemia  And CAD (S/P PCI 03/2020). The patient has followed with Endocrinology clinic since 08/15/2019 for consultative assistance with management of her diabetes. ? ?DIABETIC HISTORY:  ?Sarah [name] was diagnosed with DM many years ago. Her hemoglobin A1c has ranged from 10.3% in 2020, peaking at 16.3% in 2016. ? ? ?On her initial visit to our clinic she had an A1c of 12.5 % She was on MDI regimen but was not taking Metformin due to GI side effects so we stopped it. We adjusted MDI regimen and continued Victoza  ? ? ?Started Farxiga 02/2020 but this was temporarily held by cardiology for urinary frequency  ? ? ? ?Lives with mother who is 64 yrs ago  ?SUBJECTIVE:  ? ?During the last visit (12/25/2020): A1c 12.9 % increased Lantus and Humalog, continued Victoza and [place] ? ? ? ? ?Today (05/01/2021): Sarah [name] is here for a follow up on diabetes management.   She checks her blood sugars multiple times a week through CGM  . The patient has not had hypoglycemic episodes since the last clinic visit ? ? ? ?She has not taken lantus in 2 days ?She does not recall the last time she took Humalog  ?She has not taken Victoza in a couple of days  ? ?Denies nausea, has had polydipsia and polyuria  ?She continues to drink orange juice  ?Dexcom is not working  ? ?She lives with mother  ? ? ?HOME DIABETES REGIMEN:  ?Victoza 1.8 mg daily  ?Lantus  70 units daily- takes  ?Humalog 16 units with each meal - takes 14 units  ?Farxiga 10 mg daily  ?Correction factor: Humalog (BG -130/30) ? ? ? ? ?Statin: yes ?ACE-I/ARB: yes ? ? ? ? ?CONTINUOUS GLUCOSE MONITORING RECORD  INTERPRETATION   ? ?Dates of Recording:3/5-3/18/2023 ? ?Sensor description: Dexcom ? ?Results statistics: ?  ?CGM use % of time 93  ?Average and SD 387/34  ?Time in range 0%  ?% Time Above 180 1  ?% Time above 250 99  ?% Time Below target 0  ? ? ? ?Glycemic patterns summary: Hyperglycemia all day and night ? ?Hyperglycemic episodes  All day and night  ? ?Hypoglycemic episodes occurred n/a ? ?Overnight periods: high   ? ? ? ? ?DIABETIC COMPLICATIONS: ?Microvascular complications:  ?Retinopathy, neuropathy  ?Denies: CKD ?Last Eye Exam: Completed 12/24/2020 ? ?Macrovascular complications:  ?CVA, CAD (S/P PCI 03/2020 ?Denies: PVD ? ? ?HISTORY:  ?Past Medical History:  ?Past Medical History:  ?Diagnosis Date  ? Bilateral carpal tunnel syndrome 03/27/2019  ? Boils   ? Glaucoma   ? Heart murmur   ? HTN (hypertension)   ? Hx of adenomatous colonic polyps   ? Hyperlipidemia   ? Osteoporosis   ? Pneumonia   ? Stroke [facility])   ? Type II or unspecified type diabetes mellitus without mention of complication, uncontrolled   ? ?Past Surgical History:  ?Past Surgical History:  ?Procedure Laterality Date  ? ABDOMINAL HYSTERECTOMY    ? CARPAL TUNNEL RELEASE    ? right  ? COLONOSCOPY  12-10-10  ? per Dr. Deatra Ina, clear, repeat in 7 yrs   ?  CORONARY STENT INTERVENTION N/A 03/11/2020  ? Procedure: CORONARY STENT INTERVENTION;  Surgeon: Jettie Booze, [name];  Location: [facility];  Service: Cardiovascular;  Laterality: N/A;  ? INCISION AND DRAINAGE PERIRECTAL ABSCESS N/A 09/26/2015  ? Procedure: IRRIGATION AND DEBRIDEMENT PERIRECTAL ABSCESS;  Surgeon: Mickeal Skinner, [name];  Location: [place];  Service: General;  Laterality: N/A;  ? INTRAVASCULAR ULTRASOUND/IVUS N/A 03/11/2020  ? Procedure: Intravascular Ultrasound/IVUS;  Surgeon: Jettie Booze, [name];  Location: [place] [facility];  Service: Cardiovascular;  Laterality: N/A;  ? KNEE ARTHROSCOPY    ? right  ? LEFT HEART CATH AND CORONARY ANGIOGRAPHY N/A 03/11/2020  ? Procedure: LEFT  HEART CATH AND CORONARY ANGIOGRAPHY;  Surgeon: Jettie Booze, [name];  Location: [place] [facility];  Service: Cardiovascular;  Laterality: N/A;  ? LOOP RECORDER INSERTION N/A 10/19/2016  ? Procedure: LOOP RECORDER INSERTION;  Surgeon: Thompson Grayer, [name];  Location: [place] [facility];  Service: Cardiovascular;  Laterality: N/A;  ? ORIF ANKLE FRACTURE Right 03/24/2012  ? Procedure: OPEN REDUCTION INTERNAL FIXATION (ORIF) ANKLE FRACTURE;  Surgeon: Newt Minion, [name];  Location: [place];  Service: Orthopedics;  Laterality: Right;  Open Reduction Internal Fixation Right Bimalleolar ankle fracture  ? POLYPECTOMY    ? TEE WITHOUT CARDIOVERSION N/A 10/18/2016  ? Procedure: TRANSESOPHAGEAL ECHOCARDIOGRAM (TEE);  Surgeon: Fay Records, [name];  Location: [place];  Service: Cardiovascular;  Laterality: N/A;  ? TONSILLECTOMY    ? ?Social History:  reports that she quit smoking about 4 years ago. Her smoking use included cigarettes. She has never used smokeless tobacco. She reports current alcohol use. She reports that she does not use drugs. ?Family History:  ?Family History  ?Problem Relation Age of Onset  ? Diabetes Mother   ? Hypertension Mother   ? Stroke Father   ? Stroke Maternal Uncle   ? Stroke Paternal Uncle   ? Colon cancer Neg Hx   ? Esophageal cancer Neg Hx   ? Rectal cancer Neg Hx   ? Stomach cancer Neg Hx   ? Inflammatory bowel disease Neg Hx   ? Liver disease Neg Hx   ? Pancreatic cancer Neg Hx   ? ? ? ?HOME MEDICATIONS: ?Allergies as of 05/01/2021   ?No Known Allergies ?  ? ?  ?Medication List  ?  ? ?  ? Accurate as of May 01, 2021  1:44 PM. If you have any questions, ask your nurse or doctor.  ?  ?  ? ?  ? ?acetaminophen 500 MG tablet ?Commonly known as: TYLENOL ?Take 1,000 mg by mouth every 6 (six) hours as needed for mild pain or headache. ?  ?amLODipine 10 MG tablet ?Commonly known as: NORVASC ?TAKE 1 TABLET(10 MG) BY MOUTH DAILY ?  ?atorvastatin 80 MG tablet ?Commonly known as: LIPITOR ?TAKE 1 TABLET(80  MG) BY MOUTH DAILY AT 6 PM ?  ?calcium carbonate 750 MG chewable tablet ?Commonly known as: TUMS EX ?Chew 1 tablet by mouth as needed for heartburn. ?  ?clopidogrel 75 MG tablet ?Commonly known as: PLAVIX ?TAKE 1 TABLET(75 MG) BY MOUTH DAILY ?  ?dapagliflozin propanediol 10 MG Tabs tablet ?Commonly known as: [place] ?Take 1 tablet (10 mg total) by mouth daily. ?  ?Dexcom G6 Sensor Misc ?1 Device by Does not apply route as directed. ?  ?Dexcom G6 Transmitter Misc ?1 Device by Does not apply route as directed. ?  ?diclofenac Sodium 1 % Gel ?Commonly known as: Voltaren ?Apply 2 g topically 4 (  four) times daily. ?What changed:  ?when to take this ?reasons to take this ?  ?ezetimibe 10 MG tablet ?Commonly known as: ZETIA ?TAKE 1 TABLET(10 MG) BY MOUTH DAILY ?  ?furosemide 20 MG tablet ?Commonly known as: LASIX ?TAKE 1 TABLET(20 MG) BY MOUTH DAILY ?  ?HumaLOG KwikPen 200 UNIT/ML KwikPen ?Generic drug: insulin lispro ?Inject 14 Units into the skin 2 (two) times daily with breakfast and lunch AND 18 Units daily with supper. 15 U 15 min before meals. ?  ?Insulin Pen Needle 32G X 4 MM Misc ?1 Device by Does not apply route in the morning, at noon, in the evening, and at bedtime. ?  ?Lantus SoloStar 100 UNIT/ML Solostar Pen ?Generic drug: insulin glargine ?Inject 64 Units into the skin daily. ?  ?losartan 50 MG tablet ?Commonly known as: COZAAR ?TAKE 1 TABLET(50 MG) BY MOUTH DAILY ?  ?metoprolol tartrate 25 MG tablet ?Commonly known as: LOPRESSOR ?Take 1 tablet (25 mg total) by mouth 2 (two) times daily. ?  ?Na Sulfate-K Sulfate-Mg Sulf 17.5-3.13-1.6 GM/177ML Soln ?Commonly known as: Suprep Bowel Prep Kit ?Take 1 kit by mouth as directed. For colonoscopy prep ?  ?nitroGLYCERIN 0.4 MG SL tablet ?Commonly known as: NITROSTAT ?Place 1 tablet (0.4 mg total) under the tongue every 5 (five) minutes as needed for chest pain. one tab every 5 minutes up to 3 tablets total over 15 minutes. ?  ?ONE TOUCH ULTRA TEST test strip ?Generic  drug: glucose blood ?Check BS before and 2 hours after meals. ?  ?OT ULTRA/FASTTK CNTRL SOLN Soln ?  ?Pharmacist Choice Alcohol 70 % Misc ?Generic drug: Isopropyl Alcohol ?  ?Rocklatan 0.02-0.005 % Soln ?Generic drug: Net

## 2021-05-06 DIAGNOSIS — E1129 Type 2 diabetes mellitus with other diabetic kidney complication: Secondary | ICD-10-CM | POA: Diagnosis not present

## 2021-05-06 DIAGNOSIS — R809 Proteinuria, unspecified: Secondary | ICD-10-CM | POA: Diagnosis not present

## 2021-05-06 DIAGNOSIS — E785 Hyperlipidemia, unspecified: Secondary | ICD-10-CM | POA: Diagnosis not present

## 2021-05-06 DIAGNOSIS — I129 Hypertensive chronic kidney disease with stage 1 through stage 4 chronic kidney disease, or unspecified chronic kidney disease: Secondary | ICD-10-CM | POA: Diagnosis not present

## 2021-05-15 ENCOUNTER — Telehealth: Payer: Self-pay

## 2021-05-15 DIAGNOSIS — E1129 Type 2 diabetes mellitus with other diabetic kidney complication: Secondary | ICD-10-CM | POA: Diagnosis not present

## 2021-05-15 NOTE — Telephone Encounter (Signed)
Sarah [name] from [place] in regards to order signature for free style libra. Sarah [name] would like a call at [phone] ?

## 2021-05-15 NOTE — Telephone Encounter (Signed)
Tried to call [facility], but unable to leave a voicemail. ? ?Patient sees [facility], so paperwork needs to be sent to them. ?

## 2021-05-16 ENCOUNTER — Other Ambulatory Visit: Payer: Self-pay | Admitting: Family Medicine

## 2021-05-16 DIAGNOSIS — I639 Cerebral infarction, unspecified: Secondary | ICD-10-CM

## 2021-05-21 DIAGNOSIS — H401131 Primary open-angle glaucoma, bilateral, mild stage: Secondary | ICD-10-CM | POA: Diagnosis not present

## 2021-05-22 DIAGNOSIS — E1165 Type 2 diabetes mellitus with hyperglycemia: Secondary | ICD-10-CM | POA: Diagnosis not present

## 2021-05-22 DIAGNOSIS — Z794 Long term (current) use of insulin: Secondary | ICD-10-CM | POA: Diagnosis not present

## 2021-05-25 NOTE — Progress Notes (Signed)
? ? ?ACUTE VISIT ?Chief Complaint  ?Patient presents with  ? Foot Swelling  ?  & ankle swelling. Ongoing about a week, saw [place] [facility] a week ago. Told not to have any salt, losartan increased to 136m.   ? ?HPI: ?Ms.Sarah BURGOONis a 76y.o. female, who is here today complaining of lower extremity edema as described above. ?This is a chronic problem, she feels like it is getting worse. ?No major differences when she fist gets up in the morning, maybe slightly better. ?She wears compression stockings during the day, wonders if she needs to wear them at night when in bed. ?Negative for leg pain or erythema ?She follows with nephrologist for proteinuria. ?Currently she is on furosemide 20 mg daily. ?She is also on Farxiga 10 mg daily. ?Pending renal biopsy early 06/2021. ?03/05/21: ?Albumin 2.9. ?Total protein in urine 634. ?Protein/creatinine ratio 12,530. ?Lab Results  ?Component Value Date  ? CHOL 190 12/26/2020  ? HDL 78 12/26/2020  ? LTolono88 12/26/2020  ? TRIG 143 12/26/2020  ? CHOLHDL 2.4 12/26/2020  ? ?She has not noted dysuria, decreased urine output, or gross hematuria. ?+ Foam in urine. ? ?Lab Results  ?Component Value Date  ? CREATININE 0.7 03/05/2021  ? BUN 11 03/05/2021  ? NA 142 03/05/2021  ? K 4.0 03/05/2021  ? CL 106 03/05/2021  ? CO2 29 (A) 03/05/2021  ? ?Lab Results  ?Component Value Date  ? ALT 17 09/22/2020  ? AST 20 09/22/2020  ? ALKPHOS 99 09/22/2020  ? BILITOT <0.2 09/22/2020  ? ?BP is elevated today. ?She denies headache, visual changes, CP, dyspnea, palpitations, orthopnea, PND, or new focal weakness. ?Her lorazepam was recently increased by her nephrologist about 13 days ago. ?She is not checking BP at home. ?She is also on amlodipine 10 mg daily and metoprolol titrate 25 mg twice daily. ? ?While here in the office she started feeling "weak", checked BS with Dexcom and low at 55, we did a fingerstick in the office and 57. ?She denies skipping meals, she had lunch today. ?She is  currently on Lantus 70 units daily and Humalog.  Sliding scale, max 50 units daily. ? ?Lab Results  ?Component Value Date  ? HGBA1C >15.0 05/01/2021  ? ?Review of Systems  ?Constitutional:  Positive for fatigue. Negative for activity change, appetite change and fever.  ?HENT:  Negative for mouth sores, nosebleeds and sore throat.   ?Respiratory:  Negative for cough and wheezing.   ?Gastrointestinal:  Negative for abdominal pain, nausea and vomiting.  ?     Negative for changes in bowel habits.  ?Skin:  Negative for rash.  ?Neurological:  Negative for syncope and facial asymmetry.  ?Rest see pertinent positives and negatives per HPI. ? ?Current Outpatient Medications on File Prior to Visit  ?Medication Sig Dispense Refill  ? acetaminophen (TYLENOL) 500 MG tablet Take 1,000 mg by mouth every 6 (six) hours as needed for mild pain or headache.    ? amLODipine (NORVASC) 10 MG tablet TAKE 1 TABLET(10 MG) BY MOUTH DAILY 90 tablet 2  ? atorvastatin (LIPITOR) 80 MG tablet TAKE 1 TABLET(80 MG) BY MOUTH DAILY AT 6 PM 90 tablet 2  ? Blood Glucose Calibration (OT ULTRA/FASTTK CNTRL SOLN) SOLN     ? calcium carbonate (TUMS EX) 750 MG chewable tablet Chew 1 tablet by mouth as needed for heartburn.    ? clopidogrel (PLAVIX) 75 MG tablet TAKE 1 TABLET(75 MG) BY MOUTH DAILY 90 tablet 1  ?  Continuous Blood Gluc Sensor (DEXCOM G6 SENSOR) MISC 1 Device by Does not apply route as directed. 3 each 11  ? Continuous Blood Gluc Transmit (DEXCOM G6 TRANSMITTER) MISC 1 Device by Does not apply route as directed. 1 each 3  ? dapagliflozin propanediol (FARXIGA) 10 MG TABS tablet Take 1 tablet (10 mg total) by mouth daily. 90 tablet 3  ? diclofenac Sodium (VOLTAREN) 1 % GEL Apply 2 g topically 4 (four) times daily. (Patient taking differently: Apply 2 g topically as needed (joint pain).) 150 g 3  ? ezetimibe (ZETIA) 10 MG tablet TAKE 1 TABLET(10 MG) BY MOUTH DAILY 90 tablet 3  ? furosemide (LASIX) 20 MG tablet TAKE 1 TABLET(20 MG) BY MOUTH DAILY  30 tablet 2  ? insulin glargine (LANTUS SOLOSTAR) 100 UNIT/ML Solostar Pen Inject 70 Units into the skin daily. 60 mL 6  ? insulin lispro (HUMALOG KWIKPEN) 200 UNIT/ML KwikPen Max daily 50 units 30 mL 3  ? Insulin Pen Needle 32G X 4 MM MISC 1 Device by Does not apply route in the morning, at noon, in the evening, and at bedtime. 400 each 3  ? Lancet Devices (SIMPLE DIAGNOSTICS LANCING DEV) MISC Check BS before and 2 hours after meals. 200 each 6  ? liraglutide (VICTOZA) 18 MG/3ML SOPN Inject 1.8 mg into the skin daily. 27 mL 3  ? losartan (COZAAR) 50 MG tablet TAKE 1 TABLET(50 MG) BY MOUTH DAILY 90 tablet 1  ? Na Sulfate-K Sulfate-Mg Sulf (SUPREP BOWEL PREP KIT) 17.5-3.13-1.6 GM/177ML SOLN Take 1 kit by mouth as directed. For colonoscopy prep 354 mL 0  ? nitroGLYCERIN (NITROSTAT) 0.4 MG SL tablet Place 1 tablet (0.4 mg total) under the tongue every 5 (five) minutes as needed for chest pain. one tab every 5 minutes up to 3 tablets total over 15 minutes. 25 tablet 3  ? ONE TOUCH ULTRA TEST test strip Check BS before and 2 hours after meals. 200 each 6  ? PHARMACIST CHOICE ALCOHOL 70 % PADS     ? ROCKLATAN 0.02-0.005 % SOLN Place 1 drop into both eyes every evening.    ? sertraline (ZOLOFT) 50 MG tablet Take 1 tablet (50 mg total) by mouth daily. 90 tablet 1  ? metoprolol tartrate (LOPRESSOR) 25 MG tablet Take 1 tablet (25 mg total) by mouth 2 (two) times daily. 180 tablet 3  ? ?No current facility-administered medications on file prior to visit.  ? ?Past Medical History:  ?Diagnosis Date  ? Bilateral carpal tunnel syndrome 03/27/2019  ? Boils   ? Glaucoma   ? Heart murmur   ? HTN (hypertension)   ? Hx of adenomatous colonic polyps   ? Hyperlipidemia   ? Osteoporosis   ? Pneumonia   ? Stroke [facility])   ? Type II or unspecified type diabetes mellitus without mention of complication, uncontrolled   ? ?No Known Allergies ? ?Social History  ? ?Socioeconomic History  ? Marital status: Single  ?  Spouse name: Not on file  ?  Number of children: Not on file  ? Years of education: Not on file  ? Highest education level: Not on file  ?Occupational History  ? Not on file  ?Tobacco Use  ? Smoking status: Former  ?  Types: Cigarettes  ?  Quit date: 10/15/2016  ?  Years since quitting: 4.6  ? Smokeless tobacco: Never  ? Tobacco comments:  ?  smokes occ.   ?Vaping Use  ? Vaping Use: Never used  ?Substance and  Sexual Activity  ? Alcohol use: Yes  ?  Alcohol/week: 0.0 standard drinks  ?  Comment: occ  ? Drug use: No  ? Sexual activity: Not on file  ?Other Topics Concern  ? Not on file  ?Social History Narrative  ? Not on file  ? ?Social Determinants of Health  ? ?Financial Resource Strain: Low Risk   ? Difficulty of Paying Living Expenses: Not hard at all  ?Food Insecurity: No Food Insecurity  ? Worried About Charity fundraiser in the Last Year: Never true  ? Ran Out of Food in the Last Year: Never true  ?Transportation Needs: No Transportation Needs  ? Lack of Transportation (Medical): No  ? Lack of Transportation (Non-Medical): No  ?Physical Activity: Insufficiently Active  ? Days of Exercise per Week: 1 day  ? Minutes of Exercise per Session: 30 min  ?Stress: No Stress Concern Present  ? Feeling of Stress : Not at all  ?Social Connections: Socially Isolated  ? Frequency of Communication with Friends and Family: More than three times a week  ? Frequency of Social Gatherings with Friends and Family: More than three times a week  ? Attends Religious Services: Never  ? Active Member of Clubs or Organizations: No  ? Attends Archivist Meetings: Never  ? Marital Status: Never married  ? ?Vitals:  ? 05/26/21 1518 05/26/21 1610  ?BP: 140/80 (!) 150/60  ?Pulse: 93   ?Resp: 16   ?SpO2: 97%   ? ?Body mass index is 27.63 kg/m?. ? ?Physical Exam ?Vitals and nursing note reviewed.  ?Constitutional:   ?   General: She is not in acute distress. ?   Appearance: She is well-developed.  ?HENT:  ?   Head: Normocephalic and atraumatic.  ?   Mouth/Throat:   ?   Mouth: Mucous membranes are moist.  ?   Dentition: Abnormal dentition.  ?Eyes:  ?   Conjunctiva/sclera: Conjunctivae normal.  ?Cardiovascular:  ?   Rate and Rhythm: Normal rate and regular rhythm.

## 2021-05-26 ENCOUNTER — Ambulatory Visit (INDEPENDENT_AMBULATORY_CARE_PROVIDER_SITE_OTHER): Payer: Medicare Other | Admitting: Family Medicine

## 2021-05-26 ENCOUNTER — Encounter: Payer: Self-pay | Admitting: Family Medicine

## 2021-05-26 VITALS — BP 150/60 | HR 93 | Resp 16 | Ht 60.0 in | Wt 141.5 lb

## 2021-05-26 DIAGNOSIS — I119 Hypertensive heart disease without heart failure: Secondary | ICD-10-CM | POA: Diagnosis not present

## 2021-05-26 DIAGNOSIS — R600 Localized edema: Secondary | ICD-10-CM

## 2021-05-26 DIAGNOSIS — R809 Proteinuria, unspecified: Secondary | ICD-10-CM | POA: Diagnosis not present

## 2021-05-26 DIAGNOSIS — E162 Hypoglycemia, unspecified: Secondary | ICD-10-CM

## 2021-05-26 NOTE — Patient Instructions (Signed)
A few things to remember from today's visit: ? ?Bilateral lower extremity edema ? ?Hypoglycemia ? ?Hypertension with heart disease ? ?Proteinuria, unspecified type ? ?If you need refills please call your pharmacy. ?Do not use My Chart to request refills or for acute issues that need immediate attention. ?  ?For now for leg swelling continue compression stockings and elevation a few times during the day. ?Swelling is most likely because your protein in blood in going down, you are losing protein in the urine.Hopefully kidney biopsy will help to determine cause. ?Continue low salt diet. ?No changes in Furosemide. ?Continue monitoring blood sugars and start checking blood pressure. ?  ? ?Please be sure medication list is accurate. ?If a new problem present, please set up appointment sooner than planned today. ? ? ? ? ? ? ? ?

## 2021-06-03 ENCOUNTER — Ambulatory Visit: Payer: Medicare Other | Admitting: Internal Medicine

## 2021-06-09 ENCOUNTER — Telehealth: Payer: Self-pay | Admitting: Cardiovascular Disease

## 2021-06-09 NOTE — Telephone Encounter (Signed)
Calling back with correct fax [phone]

## 2021-06-09 NOTE — Telephone Encounter (Signed)
   Pre-operative Risk Assessment    Patient Name: Sarah [name]  DOB: 1945/08/22 MRN: [id number]      Request for Surgical Clearance    Procedure:   Kidney Biopsy  Date of Surgery:  Clearance  06-15-21                                  Surgeon:  [facility] Group or Practice Name:   Phone number:  [phone] Fax number:  [phone]   Type of Clearance Requested:   - Pharmacy:  Hold Clopidogrel (Plavix) for 5 days prior to surgery   Type of Anesthesia:  Not Indicated   Additional requests/questions:    Lorin Glass   06/09/2021, 9:09 AM

## 2021-06-11 ENCOUNTER — Other Ambulatory Visit: Payer: Self-pay | Admitting: Student

## 2021-06-11 DIAGNOSIS — N182 Chronic kidney disease, stage 2 (mild): Secondary | ICD-10-CM

## 2021-06-11 NOTE — Telephone Encounter (Signed)
   Patient Name: Sarah [name]  DOB: 1945-11-30 MRN: [id number]  Primary Cardiologist: Werner Lean, MD  Chart reviewed as part of pre-operative protocol coverage.  Patient is okay to hold Plavix 5 days prior to kidney biopsy procedure on 06/15/2021   Mable Fill, Marissa Nestle, NP 06/11/2021, 6:48 PM

## 2021-06-12 DIAGNOSIS — R809 Proteinuria, unspecified: Secondary | ICD-10-CM | POA: Diagnosis not present

## 2021-06-12 DIAGNOSIS — R609 Edema, unspecified: Secondary | ICD-10-CM | POA: Diagnosis not present

## 2021-06-12 DIAGNOSIS — E1129 Type 2 diabetes mellitus with other diabetic kidney complication: Secondary | ICD-10-CM | POA: Diagnosis not present

## 2021-06-12 DIAGNOSIS — I129 Hypertensive chronic kidney disease with stage 1 through stage 4 chronic kidney disease, or unspecified chronic kidney disease: Secondary | ICD-10-CM | POA: Diagnosis not present

## 2021-06-12 DIAGNOSIS — E785 Hyperlipidemia, unspecified: Secondary | ICD-10-CM | POA: Diagnosis not present

## 2021-06-12 NOTE — H&P (Addendum)
Chief Complaint: Patient was seen in consultation today for random renal biopsy at the request of Kruska,Lindsay A  Referring Physician(s): Justin Mend  Supervising Physician: Arne Cleveland  Patient Status: [facility] - Out-pt  History of Present Illness: Sarah [name] is a 76 y.o. female with past medical history of glaucoma, heart murmur, HTN, HLD, CVA and DM type II.  Patient is being followed by nephrology for proteinuria. Sarah [name] was referred by Danie Chandler, MD for random renal biopsy.  Past Medical History:  Diagnosis Date   Bilateral carpal tunnel syndrome 03/27/2019   Boils    Glaucoma    Heart murmur    HTN (hypertension)    Hx of adenomatous colonic polyps    Hyperlipidemia    Osteoporosis    Pneumonia    Stroke ([place])    Type II or unspecified type diabetes mellitus without mention of complication, uncontrolled     Past Surgical History:  Procedure Laterality Date   ABDOMINAL HYSTERECTOMY     CARPAL TUNNEL RELEASE     right   COLONOSCOPY  12-10-10   per Dr. Deatra Ina, clear, repeat in 7 yrs    CORONARY STENT INTERVENTION N/A 03/11/2020   Procedure: CORONARY STENT INTERVENTION;  Surgeon: Jettie Booze, MD;  Location: [place] [facility];  Service: Cardiovascular;  Laterality: N/A;   INCISION AND DRAINAGE PERIRECTAL ABSCESS N/A 09/26/2015   Procedure: IRRIGATION AND DEBRIDEMENT PERIRECTAL ABSCESS;  Surgeon: Mickeal Skinner, MD;  Location: [place];  Service: General;  Laterality: N/A;   INTRAVASCULAR ULTRASOUND/IVUS N/A 03/11/2020   Procedure: Intravascular Ultrasound/IVUS;  Surgeon: Jettie Booze, MD;  Location: [place] [facility];  Service: Cardiovascular;  Laterality: N/A;   KNEE ARTHROSCOPY     right   LEFT HEART CATH AND CORONARY ANGIOGRAPHY N/A 03/11/2020   Procedure: LEFT HEART CATH AND CORONARY ANGIOGRAPHY;  Surgeon: Jettie Booze, MD;  Location: [place] [facility];  Service: Cardiovascular;  Laterality: N/A;   LOOP RECORDER  INSERTION N/A 10/19/2016   Procedure: LOOP RECORDER INSERTION;  Surgeon: Thompson Grayer, MD;  Location: [place] [facility];  Service: Cardiovascular;  Laterality: N/A;   ORIF ANKLE FRACTURE Right 03/24/2012   Procedure: OPEN REDUCTION INTERNAL FIXATION (ORIF) ANKLE FRACTURE;  Surgeon: Newt Minion, MD;  Location: [place];  Service: Orthopedics;  Laterality: Right;  Open Reduction Internal Fixation Right Bimalleolar ankle fracture   POLYPECTOMY     TEE WITHOUT CARDIOVERSION N/A 10/18/2016   Procedure: TRANSESOPHAGEAL ECHOCARDIOGRAM (TEE);  Surgeon: Fay Records, MD;  Location: [facility] ENDOSCOPY;  Service: Cardiovascular;  Laterality: N/A;   TONSILLECTOMY      Allergies: Patient has no known allergies.  Medications: Prior to Admission medications   Medication Sig Start Date End Date Taking? Authorizing Provider  amLODipine (NORVASC) 10 MG tablet TAKE 1 TABLET(10 MG) BY MOUTH DAILY 11/19/20  Yes [name], [name], MD  atorvastatin (LIPITOR) 80 MG tablet TAKE 1 TABLET(80 MG) BY MOUTH DAILY AT 6 PM 05/18/21  Yes [name], [name], MD  clopidogrel (PLAVIX) 75 MG tablet TAKE 1 TABLET(75 MG) BY MOUTH DAILY 12/15/20  Yes [name], [name], MD  dapagliflozin propanediol (FARXIGA) 10 MG TABS tablet Take 1 tablet (10 mg total) by mouth daily. 05/01/21  Yes Shamleffer, Melanie Crazier, MD  ezetimibe (ZETIA) 10 MG tablet TAKE 1 TABLET(10 MG) BY MOUTH DAILY 07/07/20  Yes [name], [name], MD  furosemide (LASIX) 20 MG tablet TAKE 1 TABLET(20 MG) BY MOUTH DAILY 05/30/20  Yes [name], [name],  MD  insulin glargine (LANTUS SOLOSTAR) 100 UNIT/ML Solostar Pen Inject 70 Units into the skin daily. Patient taking differently: Inject 60 Units into the skin daily. 05/01/21  Yes Shamleffer, Melanie Crazier, MD  insulin lispro (HUMALOG KWIKPEN) 200 UNIT/ML KwikPen Max daily 50 units Patient taking differently: Inject 14 Units into the skin 3 (three) times daily with meals. 05/01/21  Yes Shamleffer, Melanie Crazier, MD  latanoprost (XALATAN)  0.005 % ophthalmic solution Place 1 drop into both eyes at bedtime.   Yes [provider]  liraglutide (VICTOZA) 18 MG/3ML SOPN Inject 1.8 mg into the skin daily. 05/01/21  Yes Shamleffer, Melanie Crazier, MD  losartan (COZAAR) 100 MG tablet Take 100 mg by mouth daily.   Yes [provider]  Magnesium 400 MG TABS Take 400 mg by mouth daily.   Yes [provider]  metoprolol tartrate (LOPRESSOR) 25 MG tablet Take 1 tablet (25 mg total) by mouth 2 (two) times daily. Patient taking differently: Take 25 mg by mouth daily. 09/22/20 06/10/21 Yes Chandrasekhar, Mahesh A, MD  nitroGLYCERIN (NITROSTAT) 0.4 MG SL tablet Place 1 tablet (0.4 mg total) under the tongue every 5 (five) minutes as needed for chest pain. one tab every 5 minutes up to 3 tablets total over 15 minutes. 02/29/20  Yes Chandrasekhar, Mahesh A, MD  sertraline (ZOLOFT) 50 MG tablet Take 1 tablet (50 mg total) by mouth daily. 11/17/20  Yes [name], [name], MD  Blood Glucose Calibration (OT ULTRA/FASTTK CNTRL SOLN) SOLN  07/16/17   [provider]  Continuous Blood Gluc Sensor (DEXCOM G6 SENSOR) MISC 1 Device by Does not apply route as directed. 03/03/20   Shamleffer, Melanie Crazier, MD  Continuous Blood Gluc Transmit (DEXCOM G6 TRANSMITTER) MISC 1 Device by Does not apply route as directed. 03/03/20   Shamleffer, Melanie Crazier, MD  Insulin Pen Needle 32G X 4 MM MISC 1 Device by Does not apply route in the morning, at noon, in the evening, and at bedtime. 05/01/21   Shamleffer, Melanie Crazier, MD  Lancet Devices (SIMPLE DIAGNOSTICS LANCING DEV) MISC Check BS before and 2 hours after meals. 09/26/17   [name], [name], MD  losartan (COZAAR) 50 MG tablet TAKE 1 TABLET(50 MG) BY MOUTH DAILY Patient not taking: Reported on 06/10/2021 01/08/20   [name], [name], MD  ONE TOUCH ULTRA TEST test strip Check BS before and 2 hours after meals. 09/26/17   [name], [name], MD     Family History  Problem Relation Age of Onset    Diabetes Mother    Hypertension Mother    Stroke Father    Stroke Maternal Uncle    Stroke Paternal Uncle    Colon cancer Neg Hx    Esophageal cancer Neg Hx    Rectal cancer Neg Hx    Stomach cancer Neg Hx    Inflammatory bowel disease Neg Hx    Liver disease Neg Hx    Pancreatic cancer Neg Hx     Social History   Socioeconomic History   Marital status: Single    Spouse name: Not on file   Number of children: Not on file   Years of education: Not on file   Highest education level: Not on file  Occupational History   Not on file  Tobacco Use   Smoking status: Former    Types: Cigarettes    Quit date: 10/15/2016    Years since quitting: 4.6   Smokeless tobacco: Never   Tobacco comments:  smokes occ.   Vaping Use   Vaping Use: Never used  Substance and Sexual Activity   Alcohol use: Yes    Alcohol/week: 0.0 standard drinks    Comment: occ   Drug use: No   Sexual activity: Not on file  Other Topics Concern   Not on file  Social History Narrative   Not on file   Social Determinants of Health   Financial Resource Strain: Low Risk    Difficulty of Paying Living Expenses: Not hard at all  Food Insecurity: No Food Insecurity   Worried About Charity fundraiser in the Last Year: Never true   [place] in the Last Year: Never true  Transportation Needs: No Transportation Needs   Lack of Transportation (Medical): No   Lack of Transportation (Non-Medical): No  Physical Activity: Insufficiently Active   Days of Exercise per Week: 1 day   Minutes of Exercise per Session: 30 min  Stress: No Stress Concern Present   Feeling of Stress : Not at all  Social Connections: Socially Isolated   Frequency of Communication with Friends and Family: More than three times a week   Frequency of Social Gatherings with Friends and Family: More than three times a week   Attends Religious Services: Never   Marine scientist or Organizations: No   Attends Archivist  Meetings: Never   Marital Status: Never married    Review of Systems: A 12 point ROS discussed and pertinent positives are indicated in the HPI above.  All other systems are negative.  Review of Systems  Constitutional:  Negative for appetite change, chills and fever.  Respiratory:  Negative for shortness of breath.   Cardiovascular:  Positive for leg swelling. Negative for chest pain.  Gastrointestinal:  Negative for abdominal pain, nausea and vomiting.  Neurological:  Positive for headaches. Negative for weakness.   Vital Signs: BP (!) 168/68   Pulse 76   Temp 98.1 F (36.7 C) (Oral)   Ht 5' (1.524 m)   Wt 135 lb (61.2 kg)   SpO2 98%   BMI 26.37 kg/m   Physical Exam Constitutional:      General: Sarah [name] is not in acute distress.    Appearance: Normal appearance. Sarah [name] is not ill-appearing.  HENT:     Head: Normocephalic and atraumatic.     Mouth/Throat:     Mouth: Mucous membranes are moist.     Pharynx: Oropharynx is clear.  Eyes:     Extraocular Movements: Extraocular movements intact.     Pupils: Pupils are equal, round, and reactive to light.  Cardiovascular:     Rate and Rhythm: Normal rate and regular rhythm.     Pulses: Normal pulses.     Heart sounds: Normal heart sounds.  Pulmonary:     Effort: Pulmonary effort is normal. No respiratory distress.     Breath sounds: Normal breath sounds.  Abdominal:     General: Bowel sounds are normal. There is no distension.     Palpations: Abdomen is soft.     Tenderness: There is no abdominal tenderness. There is no guarding.  Musculoskeletal:     Right lower leg: Edema present.     Left lower leg: Edema present.  Skin:    General: Skin is warm and dry.  Neurological:     Mental Status: Sarah [name] is alert and oriented to person, place, and time.  Psychiatric:        Mood and Affect: Mood  normal.        Behavior: Behavior normal.        Thought Content: Thought content normal.        Judgment: Judgment normal.     Imaging: No results found.  Labs:  CBC: Recent Labs    06/15/21 0648  WBC 8.6  HGB 11.2*  HCT 34.0*  PLT 329    COAGS: Recent Labs    06/15/21 0648  INR 1.0    BMP: Recent Labs    03/05/21 0000  NA 142  K 4.0  CL 106  CO2 29*  BUN 11  CALCIUM 9.8  CREATININE 0.7    LIVER FUNCTION TESTS: Recent Labs    09/22/20 0958 03/05/21 0000  BILITOT <0.2  --   AST 20  --   ALT 17  --   ALKPHOS 99  --   PROT 6.2  --   ALBUMIN 3.5* 2.9*    TUMOR MARKERS: No results for input(s): AFPTM, CEA, CA199, CHROMGRNA in the last 8760 hours.  Assessment and Plan: History of glaucoma, heart murmur, HTN, HLD, CVA and DM type II.  Patient is being followed by nephrology for proteinuria. Sarah [name] was referred by Danie Chandler, MD for random renal biopsy.  Pt resting on stretcher. Sarah [name] is A&O, calm and pleasant.  Sarah [name] is in no distress.  Sarah [name] is NPO per order.  Pt has not taken Plavix since 5/31.   Risks and benefits of random renal biopsy with moderate sedation was discussed with the patient and/or patient's family including, but not limited to bleeding, infection, damage to adjacent structures or low yield requiring additional tests.  All of the questions were answered and there is agreement to proceed.  Consent signed and in chart.   Thank you for this interesting consult.  I greatly enjoyed meeting DAVEN PINCKNEY and look forward to participating in their care.  A copy of this report was sent to the requesting provider on this date.  Electronically Signed: Tyson Alias, NP 06/15/2021, 8:15 AM   I spent a total of 20 minutes in face to face in clinical consultation, greater than 50% of which was counseling/coordinating care for random renal biopsy.

## 2021-06-14 ENCOUNTER — Other Ambulatory Visit: Payer: Self-pay | Admitting: Student

## 2021-06-15 ENCOUNTER — Encounter (HOSPITAL_COMMUNITY): Payer: Self-pay

## 2021-06-15 ENCOUNTER — Ambulatory Visit (HOSPITAL_COMMUNITY)
Admission: RE | Admit: 2021-06-15 | Discharge: 2021-06-15 | Disposition: A | Discharge status: Home / Self Care | Payer: Medicare Other | Source: Ambulatory Visit | Attending: Internal Medicine | Admitting: Internal Medicine

## 2021-06-15 ENCOUNTER — Other Ambulatory Visit: Payer: Self-pay

## 2021-06-15 VITALS — BP 102/48 | HR 72 | Temp 98.1°F | Resp 18 | Ht 60.0 in | Wt 135.0 lb

## 2021-06-15 DIAGNOSIS — R809 Proteinuria, unspecified: Secondary | ICD-10-CM | POA: Insufficient documentation

## 2021-06-15 DIAGNOSIS — H409 Unspecified glaucoma: Secondary | ICD-10-CM | POA: Diagnosis not present

## 2021-06-15 DIAGNOSIS — I129 Hypertensive chronic kidney disease with stage 1 through stage 4 chronic kidney disease, or unspecified chronic kidney disease: Secondary | ICD-10-CM | POA: Diagnosis not present

## 2021-06-15 DIAGNOSIS — E1122 Type 2 diabetes mellitus with diabetic chronic kidney disease: Secondary | ICD-10-CM | POA: Diagnosis not present

## 2021-06-15 DIAGNOSIS — N182 Chronic kidney disease, stage 2 (mild): Secondary | ICD-10-CM | POA: Diagnosis not present

## 2021-06-15 DIAGNOSIS — Z8673 Personal history of transient ischemic attack (TIA), and cerebral infarction without residual deficits: Secondary | ICD-10-CM | POA: Diagnosis not present

## 2021-06-15 DIAGNOSIS — R011 Cardiac murmur, unspecified: Secondary | ICD-10-CM | POA: Diagnosis not present

## 2021-06-15 DIAGNOSIS — R42 Dizziness and giddiness: Secondary | ICD-10-CM | POA: Insufficient documentation

## 2021-06-15 DIAGNOSIS — E785 Hyperlipidemia, unspecified: Secondary | ICD-10-CM | POA: Diagnosis not present

## 2021-06-15 LAB — CBC
HCT: 34 % — ABNORMAL LOW (ref 36.0–46.0)
Hemoglobin: 11.2 g/dL — ABNORMAL LOW (ref 12.0–15.0)
MCH: 31.3 pg (ref 26.0–34.0)
MCHC: 32.9 g/dL (ref 30.0–36.0)
MCV: 95 fL (ref 80.0–100.0)
Platelets: 329 10*3/uL (ref 150–400)
RBC: 3.58 MIL/uL — ABNORMAL LOW (ref 3.87–5.11)
RDW: 13.4 % (ref 11.5–15.5)
WBC: 8.6 10*3/uL (ref 4.0–10.5)
nRBC: 0 % (ref 0.0–0.2)

## 2021-06-15 LAB — PROTIME-INR
INR: 1 (ref 0.8–1.2)
Prothrombin Time: 12.6 seconds (ref 11.4–15.2)

## 2021-06-15 LAB — GLUCOSE, CAPILLARY: Glucose-Capillary: 233 mg/dL — ABNORMAL HIGH (ref 70–99)

## 2021-06-15 MED ORDER — MIDAZOLAM HCL 2 MG/2ML IJ SOLN
INTRAMUSCULAR | Status: AC | PRN
  Administered 2021-06-15: 1 mg via INTRAVENOUS
  Administered 2021-06-15: .5 mg via INTRAVENOUS

## 2021-06-15 MED ORDER — FENTANYL CITRATE (PF) 100 MCG/2ML IJ SOLN
INTRAMUSCULAR | Status: AC
  Filled 2021-06-15: qty 2

## 2021-06-15 MED ORDER — ONDANSETRON HCL 4 MG/2ML IJ SOLN
INTRAMUSCULAR | Status: AC
  Filled 2021-06-15: qty 2

## 2021-06-15 MED ORDER — FENTANYL CITRATE (PF) 100 MCG/2ML IJ SOLN
INTRAMUSCULAR | Status: AC | PRN
  Administered 2021-06-15: 50 ug via INTRAVENOUS
  Administered 2021-06-15: 25 ug via INTRAVENOUS

## 2021-06-15 MED ORDER — SODIUM CHLORIDE 0.9 % IV SOLN: INTRAVENOUS | Status: DC

## 2021-06-15 MED ORDER — GELATIN ABSORBABLE 12-7 MM EX MISC
CUTANEOUS | Status: AC
  Filled 2021-06-15: qty 1

## 2021-06-15 MED ORDER — HYDRALAZINE HCL 20 MG/ML IJ SOLN
INTRAMUSCULAR | Status: AC | PRN
  Administered 2021-06-15: 10 mg via INTRAVENOUS

## 2021-06-15 MED ORDER — HYDROCODONE-ACETAMINOPHEN 5-325 MG PO TABS: 1.0000 | ORAL_TABLET | ORAL | Status: DC | PRN

## 2021-06-15 MED ORDER — MIDAZOLAM HCL 2 MG/2ML IJ SOLN
INTRAMUSCULAR | Status: AC
  Filled 2021-06-15: qty 2

## 2021-06-15 MED ORDER — ONDANSETRON HCL 4 MG/2ML IJ SOLN
4.0000 mg | Freq: Once | INTRAMUSCULAR | Status: AC
  Administered 2021-06-15: 4 mg via INTRAVENOUS
  Filled 2021-06-15: qty 2

## 2021-06-15 MED ORDER — LIDOCAINE HCL (PF) 1 % IJ SOLN
INTRAMUSCULAR | Status: AC
  Filled 2021-06-15: qty 30

## 2021-06-15 MED ORDER — HYDRALAZINE HCL 20 MG/ML IJ SOLN
INTRAMUSCULAR | Status: AC
  Filled 2021-06-15: qty 1

## 2021-06-15 NOTE — Sedation Documentation (Signed)
Left Kidney bx site is clean, dry and intact. No drainage from dressing.

## 2021-06-15 NOTE — Progress Notes (Signed)
Per Suezanne Jacquet, PA client may resume plavix tomorrow; client and her sister notified and they voiced understanding

## 2021-06-15 NOTE — Procedures (Signed)
  Procedure:  [place] core renal biopsy [facility] x3 16g Preprocedure diagnosis: The primary encounter diagnosis was Dizziness. Diagnoses of Proteinuria, unspecified type and CKD (chronic kidney disease), stage II were also pertinent to this visit.  Postprocedure diagnosis: same EBL:    minimal Complications:   none immediate  See full dictation in [facility].  Dillard Cannon MD Main # [phone] Pager  [phone] Mobile [phone]

## 2021-06-18 ENCOUNTER — Encounter (HOSPITAL_COMMUNITY): Payer: Self-pay

## 2021-06-18 LAB — SURGICAL PATHOLOGY

## 2021-07-09 DIAGNOSIS — Z794 Long term (current) use of insulin: Secondary | ICD-10-CM | POA: Diagnosis not present

## 2021-07-09 DIAGNOSIS — E1165 Type 2 diabetes mellitus with hyperglycemia: Secondary | ICD-10-CM | POA: Diagnosis not present

## 2021-07-31 ENCOUNTER — Encounter: Payer: Self-pay | Admitting: Gastroenterology

## 2021-08-31 ENCOUNTER — Encounter: Payer: Self-pay | Admitting: Internal Medicine

## 2021-08-31 ENCOUNTER — Ambulatory Visit (INDEPENDENT_AMBULATORY_CARE_PROVIDER_SITE_OTHER): Payer: Medicare Other | Admitting: Internal Medicine

## 2021-08-31 VITALS — BP 150/70 | HR 74 | Ht 60.0 in | Wt 124.0 lb

## 2021-08-31 DIAGNOSIS — Z794 Long term (current) use of insulin: Secondary | ICD-10-CM

## 2021-08-31 DIAGNOSIS — E1165 Type 2 diabetes mellitus with hyperglycemia: Secondary | ICD-10-CM | POA: Diagnosis not present

## 2021-08-31 DIAGNOSIS — E1159 Type 2 diabetes mellitus with other circulatory complications: Secondary | ICD-10-CM | POA: Diagnosis not present

## 2021-08-31 DIAGNOSIS — E1142 Type 2 diabetes mellitus with diabetic polyneuropathy: Secondary | ICD-10-CM | POA: Diagnosis not present

## 2021-08-31 DIAGNOSIS — E1121 Type 2 diabetes mellitus with diabetic nephropathy: Secondary | ICD-10-CM | POA: Diagnosis not present

## 2021-08-31 LAB — POCT GLYCOSYLATED HEMOGLOBIN (HGB A1C): Hemoglobin A1C: 11.7 % — AB (ref 4.0–5.6)

## 2021-08-31 MED ORDER — TOUJEO SOLOSTAR 300 UNIT/ML ~~LOC~~ SOPN: 80.0000 [IU] | PEN_INJECTOR | Freq: Every day | SUBCUTANEOUS | 3 refills | Status: DC

## 2021-08-31 NOTE — Patient Instructions (Addendum)
-   Switch Lantus to Toujeo and take 80 units once daily  - Continue  Farxiga 10 mg, 1 tablet daily  - Continue Victoza 1.8 mg daily  - Continue  Humalog 16 units with each meal  - Humalog correctional insulin: ADD extra units on insulin to your meal-time Humalog dose if your blood sugars are higher than 160. Use the scale below to help guide you:   Blood sugar before meal Number of units to inject  Less than 160 0 unit  161 -  190 1 units  191 -  220 2 units  221 -  250 3 units  251 -  280 4 units  281 -  310 5 units  311 -  340 6 units  341 -  370 7 units  371 -  400 8 units  401 - 430 9 units        HOW TO TREAT LOW BLOOD SUGARS (Blood sugar LESS THAN 70 MG/DL) Please follow the RULE OF 15 for the treatment of hypoglycemia treatment (when your (blood sugars are less than 70 mg/dL)   STEP 1: Take 15 grams of carbohydrates when your blood sugar is low, which includes:  3-4 GLUCOSE TABS  OR 3-4 OZ OF JUICE OR REGULAR SODA OR ONE TUBE OF GLUCOSE GEL    STEP 2: RECHECK blood sugar in 15 MINUTES STEP 3: If your blood sugar is still low at the 15 minute recheck --> then, go back to STEP 1 and treat AGAIN with another 15 grams of carbohydrates.

## 2021-08-31 NOTE — Progress Notes (Signed)
Name: Sarah [name]  Age/ Sex: 76 y.o., female   MRN/ DOB: [id number], 07/07/1945     PCP: [name], [name], MD   Reason for Endocrinology Evaluation: Type 2 Diabetes Mellitus  Initial Endocrine Consultative Visit: 08/15/2019    PATIENT IDENTIFIER: Sarah [name] is a 76 y.o. female with a past medical history of T2DM, CVA, Dyslipidemia  And CAD (S/P PCI 03/2020). The patient has followed with Endocrinology clinic since 08/15/2019 for consultative assistance with management of her diabetes.  DIABETIC HISTORY:  Sarah [name] was diagnosed with DM many years ago. Her hemoglobin A1c has ranged from 10.3% in 2020, peaking at 16.3% in 2016.   On her initial visit to our clinic she had an A1c of 12.5 % She was on MDI regimen but was not taking Metformin due to GI side effects so we stopped it. We adjusted MDI regimen and continued Victoza    Started Farxiga 02/2020    Lives with mother who is 48 yrs ago  SUBJECTIVE:   During the last visit (05/01/2021): A1c 15.0 % increased Lantus and Humalog, continued Victoza and [place]     Today (08/31/2021): Sarah [name] is here for a follow up on diabetes management.   She checks her blood sugars multiple times a week through CGM  . 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 Continues to follow up with [facility]   She lives with mother    HOME DIABETES REGIMEN:  Victoza 1.8 mg daily  Lantus  70 units daily-  Humalog 16 units with each meal  Farxiga 10 mg daily  Correction factor: Humalog (BG -130/30)     Statin: yes ACE-I/ARB: yes     CONTINUOUS GLUCOSE MONITORING RECORD INTERPRETATION    Dates of Recording:8/8-8/21/2023  Sensor description: Dexcom  Results statistics:   CGM use % of time 79  Average and SD 339/55  Time in range 0%  % Time Above 180 6  % Time above 250 94  % Time Below target 0     Glycemic  patterns summary: Hyperglycemia all day and night  Hyperglycemic episodes  All day and night   Hypoglycemic episodes occurred n/a  Overnight periods: high       DIABETIC COMPLICATIONS: Microvascular complications:  Retinopathy, neuropathy  Denies: CKD Last Eye Exam: Completed 12/24/2020  Macrovascular complications:  CVA, CAD (S/P PCI 03/2020 Denies: PVD   HISTORY:  Past Medical History:  Past Medical History:  Diagnosis Date   Bilateral carpal tunnel syndrome 03/27/2019   Boils    Glaucoma    Heart murmur    HTN (hypertension)    Hx of adenomatous colonic polyps    Hyperlipidemia    Osteoporosis    Pneumonia    Stroke ([place])    Type II or unspecified type diabetes mellitus without mention of complication, uncontrolled    Past Surgical History:  Past Surgical History:  Procedure Laterality Date   ABDOMINAL HYSTERECTOMY     CARPAL TUNNEL RELEASE     right   COLONOSCOPY  12-10-10   per Dr. Deatra Ina, clear, repeat in 7 yrs    CORONARY STENT INTERVENTION N/A 03/11/2020   Procedure: CORONARY STENT INTERVENTION;  Surgeon: Jettie Booze, MD;  Location: [place] [facility];  Service: Cardiovascular;  Laterality: N/A;   INCISION AND DRAINAGE PERIRECTAL ABSCESS N/A 09/26/2015   Procedure: IRRIGATION AND DEBRIDEMENT PERIRECTAL ABSCESS;  Surgeon: Arta Bruce  Kinsinger, MD;  Location: [place];  Service: General;  Laterality: N/A;   INTRAVASCULAR ULTRASOUND/IVUS N/A 03/11/2020   Procedure: Intravascular Ultrasound/IVUS;  Surgeon: Jettie Booze, MD;  Location: [facility];  Service: Cardiovascular;  Laterality: N/A;   KNEE ARTHROSCOPY     right   LEFT HEART CATH AND CORONARY ANGIOGRAPHY N/A 03/11/2020   Procedure: LEFT HEART CATH AND CORONARY ANGIOGRAPHY;  Surgeon: Jettie Booze, MD;  Location: [place] [facility];  Service: Cardiovascular;  Laterality: N/A;   LOOP RECORDER INSERTION N/A 10/19/2016   Procedure: LOOP RECORDER INSERTION;  Surgeon: Thompson Grayer, MD;   Location: [place];  Service: Cardiovascular;  Laterality: N/A;   ORIF ANKLE FRACTURE Right 03/24/2012   Procedure: OPEN REDUCTION INTERNAL FIXATION (ORIF) ANKLE FRACTURE;  Surgeon: Newt Minion, MD;  Location: [facility];  Service: Orthopedics;  Laterality: Right;  Open Reduction Internal Fixation Right Bimalleolar ankle fracture   POLYPECTOMY     TEE WITHOUT CARDIOVERSION N/A 10/18/2016   Procedure: TRANSESOPHAGEAL ECHOCARDIOGRAM (TEE);  Surgeon: Fay Records, MD;  Location: [facility];  Service: Cardiovascular;  Laterality: N/A;   TONSILLECTOMY     Social History:  reports that she quit smoking about 4 years ago. Her smoking use included cigarettes. She has never used smokeless tobacco. She reports current alcohol use. She reports that she does not use drugs. Family History:  Family History  Problem Relation Age of Onset   Diabetes Mother    Hypertension Mother    Stroke Father    Stroke Maternal Uncle    Stroke Paternal Uncle    Colon cancer Neg Hx    Esophageal cancer Neg Hx    Rectal cancer Neg Hx    Stomach cancer Neg Hx    Inflammatory bowel disease Neg Hx    Liver disease Neg Hx    Pancreatic cancer Neg Hx      HOME MEDICATIONS: Allergies as of 08/31/2021   No Known Allergies      Medication List        Accurate as of August 31, 2021 12:20 PM. If you have any questions, ask your nurse or doctor.          amLODipine 10 MG tablet Commonly known as: NORVASC TAKE 1 TABLET(10 MG) BY MOUTH DAILY   atorvastatin 80 MG tablet Commonly known as: LIPITOR TAKE 1 TABLET(80 MG) BY MOUTH DAILY AT 6 PM   clopidogrel 75 MG tablet Commonly known as: PLAVIX TAKE 1 TABLET(75 MG) BY MOUTH DAILY   dapagliflozin propanediol 10 MG Tabs tablet Commonly known as: Farxiga Take 1 tablet (10 mg total) by mouth daily.   Dexcom G6 Sensor Misc 1 Device by Does not apply route as directed.   Dexcom G6 Transmitter Misc 1 Device by Does not apply route as directed.    ezetimibe 10 MG tablet Commonly known as: ZETIA TAKE 1 TABLET(10 MG) BY MOUTH DAILY   furosemide 20 MG tablet Commonly known as: LASIX TAKE 1 TABLET(20 MG) BY MOUTH DAILY What changed: See the new instructions.   HumaLOG KwikPen 200 UNIT/ML KwikPen Generic drug: insulin lispro Max daily 50 units What changed:  how much to take how to take this when to take this additional instructions   Insulin Pen Needle 32G X 4 MM Misc 1 Device by Does not apply route in the morning, at noon, in the evening, and at bedtime.   Lantus SoloStar 100 UNIT/ML Solostar Pen Generic drug: insulin glargine Inject 70 Units  into the skin daily. What changed: how much to take   latanoprost 0.005 % ophthalmic solution Commonly known as: XALATAN Place 1 drop into both eyes at bedtime.   losartan 100 MG tablet Commonly known as: COZAAR Take 100 mg by mouth daily.   losartan 50 MG tablet Commonly known as: COZAAR TAKE 1 TABLET(50 MG) BY MOUTH DAILY   Magnesium 400 MG Tabs Take 400 mg by mouth daily.   metoprolol tartrate 25 MG tablet Commonly known as: LOPRESSOR Take 1 tablet (25 mg total) by mouth 2 (two) times daily. What changed: when to take this   nitroGLYCERIN 0.4 MG SL tablet Commonly known as: NITROSTAT Place 1 tablet (0.4 mg total) under the tongue every 5 (five) minutes as needed for chest pain. one tab every 5 minutes up to 3 tablets total over 15 minutes.   ONE TOUCH ULTRA TEST test strip Generic drug: glucose blood Check BS before and 2 hours after meals.   OT ULTRA/FASTTK CNTRL SOLN Soln   sertraline 50 MG tablet Commonly known as: ZOLOFT Take 1 tablet (50 mg total) by mouth daily.   Simple Diagnostics Lancing Dev Misc Check BS before and 2 hours after meals.   Victoza 18 MG/3ML Sopn Generic drug: liraglutide Inject 1.8 mg into the skin daily.         OBJECTIVE:   Vital Signs: BP (!) 150/70   Pulse 74   Ht 5' (1.524 m)   Wt 124 lb (56.2 kg)   SpO2 97%    BMI 24.22 kg/m   Wt Readings from Last 3 Encounters:  08/31/21 124 lb (56.2 kg)  06/15/21 135 lb (61.2 kg)  05/26/21 141 lb 8 oz (64.2 kg)     Exam: General: Pt appears well and is in NAD  Lungs: Clear with good BS bilat with no rales, rhonchi, or wheezes  Heart: RRR   Extremities: No pretibial edema.   Neuro: MS is good with appropriate affect, pt is alert and Ox3   DM foot exam: 12/25/2020 The skin of the feet is intact without sores or ulcerations. The pedal pulses are 1+ on right and 1+ on left. The sensation is intact to a screening 5.07, 10 gram monofilament bilaterally    DATA REVIEWED:  Lab Results  Component Value Date   HGBA1C 11.7 (A) 08/31/2021   HGBA1C >15.0 05/01/2021   HGBA1C 12.9 (A) 12/25/2020  Nephrology records not available   ASSESSMENT / PLAN / RECOMMENDATIONS:   1) Type 2 Diabetes Mellitus, Poorly controlled, With neuropathic and  macrovascular  complications - Most recent A1c of 11.7  %. Goal A1c < 7.0 %.     -Her A1c is trending down but continues to be above goal -I made sure that she dowels her insulin pen to 70 rather than 7 units.  I am going to increase her basal insulin as her fasting BG's never trend down overnight - Pt with poorly controlled diabetes due to medication non-adherence and dietary indiscretions. 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    - She had declined pump technology  - Will switch Lantus to toujeo  -I have encouraged her to use correction scale before each meal when needed  MEDICATIONS: - Continue Farxiga  10 mg, 1 tablet daily  - Continue Victoza 1.8 mg daily  - Stop Lantus - Start Toujeo 80 units daily - Continue Humalog 16  units with meals  - CF : Humalog (BG-130/30)  EDUCATION / INSTRUCTIONS: BG monitoring instructions: Patient is instructed to check her blood sugars 3 times a day, before meals  Call [place] [facility] if: BG persistently < 70 I reviewed  the Rule of 15 for the treatment of hypoglycemia in detail with the patient. Literature supplied.   2) Diabetic complications:  Eye: there's a questionable diabetic retinopathy from her history Neuro/ Feet: Does have known diabetic peripheral neuropathy .  Renal: Patient does not have known baseline CKD. She   is  on an ACEI/ARB at present.       F/U in 4 months    Signed electronically by: Mack Guise, MD  [facility]  [address] [place], [place] [address] Phone: [phone] FAX: [phone]   CC: [name], [name], [address] [place] [address] Phone: [phone]  Fax: [phone]  Return to Endocrinology clinic as below: Future Appointments  Date Time Provider [place]  10/01/2021  2:10 PM Mansour[place], Telford Nab., MD [facility]  10/07/2021 12:45 PM Imogene Burn, PA-C [facility]  05/03/2022  9:30 AM [place] [facility]

## 2021-09-04 ENCOUNTER — Other Ambulatory Visit: Payer: Self-pay | Admitting: Family Medicine

## 2021-09-15 DIAGNOSIS — I129 Hypertensive chronic kidney disease with stage 1 through stage 4 chronic kidney disease, or unspecified chronic kidney disease: Secondary | ICD-10-CM | POA: Diagnosis not present

## 2021-09-15 DIAGNOSIS — R609 Edema, unspecified: Secondary | ICD-10-CM | POA: Diagnosis not present

## 2021-09-15 DIAGNOSIS — E1129 Type 2 diabetes mellitus with other diabetic kidney complication: Secondary | ICD-10-CM | POA: Diagnosis not present

## 2021-09-15 DIAGNOSIS — R809 Proteinuria, unspecified: Secondary | ICD-10-CM | POA: Diagnosis not present

## 2021-09-15 DIAGNOSIS — E785 Hyperlipidemia, unspecified: Secondary | ICD-10-CM | POA: Diagnosis not present

## 2021-09-16 LAB — COMPREHENSIVE METABOLIC PANEL
Albumin: 2.7 — AB (ref 3.5–5.0)
Calcium: 9.7 (ref 8.7–10.7)

## 2021-09-16 LAB — BASIC METABOLIC PANEL
BUN: 10 (ref 4–21)
CO2: 31 — AB (ref 13–22)
Chloride: 106 (ref 99–108)
Creatinine: 0.7 (ref 0.5–1.1)
Glucose: 278
Potassium: 3.5 mEq/L (ref 3.5–5.1)
Sodium: 142 (ref 137–147)

## 2021-09-18 ENCOUNTER — Encounter: Payer: Self-pay | Admitting: Family Medicine

## 2021-09-18 NOTE — Progress Notes (Deleted)
ACUTE VISIT No chief complaint on file.  HPI: Sarah [name] is a 76 y.o. female, who is here today complaining of *** HPI  Review of Systems Rest see pertinent positives and negatives per HPI.  Current Outpatient Medications on File Prior to Visit  Medication Sig Dispense Refill   amLODipine (NORVASC) 10 MG tablet TAKE 1 TABLET(10 MG) BY MOUTH DAILY 90 tablet 2   atorvastatin (LIPITOR) 80 MG tablet TAKE 1 TABLET(80 MG) BY MOUTH DAILY AT 6 PM 90 tablet 2   Blood Glucose Calibration (OT ULTRA/FASTTK CNTRL SOLN) SOLN      clopidogrel (PLAVIX) 75 MG tablet TAKE 1 TABLET(75 MG) BY MOUTH DAILY 90 tablet 1   Continuous Blood Gluc Sensor (DEXCOM G6 SENSOR) MISC 1 Device by Does not apply route as directed. 3 each 11   Continuous Blood Gluc Transmit (DEXCOM G6 TRANSMITTER) MISC 1 Device by Does not apply route as directed. 1 each 3   dapagliflozin propanediol (FARXIGA) 10 MG TABS tablet Take 1 tablet (10 mg total) by mouth daily. 90 tablet 3   ezetimibe (ZETIA) 10 MG tablet TAKE 1 TABLET(10 MG) BY MOUTH DAILY 90 tablet 3   furosemide (LASIX) 20 MG tablet TAKE 1 TABLET(20 MG) BY MOUTH DAILY (Patient taking differently: 40 mg.) 30 tablet 2   insulin glargine, 1 Unit Dial, (TOUJEO SOLOSTAR) 300 UNIT/ML Solostar Pen Inject 80 Units into the skin daily in the afternoon. 30 mL 3   insulin lispro (HUMALOG KWIKPEN) 200 UNIT/ML KwikPen Max daily 50 units (Patient taking differently: Inject 14 Units into the skin 3 (three) times daily with meals.) 30 mL 3   Insulin Pen Needle 32G X 4 MM MISC 1 Device by Does not apply route in the morning, at noon, in the evening, and at bedtime. 400 each 3   Lancet Devices (SIMPLE DIAGNOSTICS LANCING DEV) MISC Check BS before and 2 hours after meals. 200 each 6   latanoprost (XALATAN) 0.005 % ophthalmic solution Place 1 drop into both eyes at bedtime.     liraglutide (VICTOZA) 18 MG/3ML SOPN Inject 1.8 mg into the skin daily. 27 mL 3   losartan (COZAAR) 100  MG tablet Take 100 mg by mouth daily.     losartan (COZAAR) 50 MG tablet TAKE 1 TABLET(50 MG) BY MOUTH DAILY (Patient not taking: Reported on 06/10/2021) 90 tablet 1   Magnesium 400 MG TABS Take 400 mg by mouth daily.     metoprolol tartrate (LOPRESSOR) 25 MG tablet Take 1 tablet (25 mg total) by mouth 2 (two) times daily. (Patient taking differently: Take 25 mg by mouth daily.) 180 tablet 3   nitroGLYCERIN (NITROSTAT) 0.4 MG SL tablet Place 1 tablet (0.4 mg total) under the tongue every 5 (five) minutes as needed for chest pain. one tab every 5 minutes up to 3 tablets total over 15 minutes. 25 tablet 3   ONE TOUCH ULTRA TEST test strip Check BS before and 2 hours after meals. 200 each 6   sertraline (ZOLOFT) 50 MG tablet Take 1 tablet (50 mg total) by mouth daily. 90 tablet 1   No current facility-administered medications on file prior to visit.     Past Medical History:  Diagnosis Date   Bilateral carpal tunnel syndrome 03/27/2019   Boils    Glaucoma    Heart murmur    HTN (hypertension)    Hx of adenomatous colonic polyps    Hyperlipidemia    Osteoporosis    Pneumonia  Stroke ([place])    Type II or unspecified type diabetes mellitus without mention of complication, uncontrolled    No Known Allergies  Social History   Socioeconomic History   Marital status: Single    Spouse name: Not on file   Number of children: Not on file   Years of education: Not on file   Highest education level: Not on file  Occupational History   Not on file  Tobacco Use   Smoking status: Former    Types: Cigarettes    Quit date: 10/15/2016    Years since quitting: 4.9   Smokeless tobacco: Never   Tobacco comments:    smokes occ.   Vaping Use   Vaping Use: Never used  Substance and Sexual Activity   Alcohol use: Yes    Alcohol/week: 0.0 standard drinks of alcohol    Comment: occ   Drug use: No   Sexual activity: Not on file  Other Topics Concern   Not on file  Social History Narrative    Not on file   Social Determinants of Health   Financial Resource Strain: Low Risk  (04/29/2021)   Overall Financial Resource Strain (CARDIA)    Difficulty of Paying Living Expenses: Not hard at all  Food Insecurity: No Food Insecurity (04/29/2021)   Hunger Vital Sign    Worried About Running Out of Food in the Last Year: Never true    Ran Out of Food in the Last Year: Never true  Transportation Needs: No Transportation Needs (04/29/2021)   PRAPARE - Hydrologist (Medical): No    Lack of Transportation (Non-Medical): No  Physical Activity: Insufficiently Active (04/29/2021)   Exercise Vital Sign    Days of Exercise per Week: 1 day    Minutes of Exercise per Session: 30 min  Stress: No Stress Concern Present (04/29/2021)   [place]    Feeling of Stress : Not at all  Social Connections: Socially Isolated (04/29/2021)   Social Connection and Isolation Panel [NHANES]    Frequency of Communication with Friends and Family: More than three times a week    Frequency of Social Gatherings with Friends and Family: More than three times a week    Attends Religious Services: Never    Marine scientist or Organizations: No    Attends Archivist Meetings: Never    Marital Status: Never married    There were no vitals filed for this visit. There is no height or weight on file to calculate BMI.  Physical Exam  ASSESSMENT AND PLAN:  There are no diagnoses linked to this encounter.   No follow-ups on file.   [name]. [name], MD  [facility]. [place] office.  Discharge Instructions   None

## 2021-09-21 ENCOUNTER — Ambulatory Visit: Payer: Medicare Other | Admitting: Family Medicine

## 2021-09-25 ENCOUNTER — Telehealth: Payer: Self-pay | Admitting: Licensed Clinical Social Worker

## 2021-09-25 NOTE — Progress Notes (Deleted)
ACUTE VISIT No chief complaint on file.  HPI: [name] is a 76 y.o. female, who is here today complaining of *** HPI  Review of Systems Rest see pertinent positives and negatives per HPI.  Current Outpatient Medications on File Prior to Visit  Medication Sig Dispense Refill  . amLODipine (NORVASC) 10 MG tablet TAKE 1 TABLET(10 MG) BY MOUTH DAILY 90 tablet 2  . atorvastatin (LIPITOR) 80 MG tablet TAKE 1 TABLET(80 MG) BY MOUTH DAILY AT 6 PM 90 tablet 2  . Blood Glucose Calibration (OT ULTRA/FASTTK CNTRL SOLN) SOLN     . clopidogrel (PLAVIX) 75 MG tablet TAKE 1 TABLET(75 MG) BY MOUTH DAILY 90 tablet 1  . Continuous Blood Gluc Sensor (DEXCOM G6 SENSOR) MISC 1 Device by Does not apply route as directed. 3 each 11  . Continuous Blood Gluc Transmit (DEXCOM G6 TRANSMITTER) MISC 1 Device by Does not apply route as directed. 1 each 3  . dapagliflozin propanediol (FARXIGA) 10 MG TABS tablet Take 1 tablet (10 mg total) by mouth daily. 90 tablet 3  . ezetimibe (ZETIA) 10 MG tablet TAKE 1 TABLET(10 MG) BY MOUTH DAILY 90 tablet 3  . furosemide (LASIX) 20 MG tablet TAKE 1 TABLET(20 MG) BY MOUTH DAILY (Patient taking differently: 40 mg.) 30 tablet 2  . insulin glargine, 1 Unit Dial, (TOUJEO SOLOSTAR) 300 UNIT/ML Solostar Pen Inject 80 Units into the skin daily in the afternoon. 30 mL 3  . insulin lispro (HUMALOG KWIKPEN) 200 UNIT/ML KwikPen Max daily 50 units (Patient taking differently: Inject 14 Units into the skin 3 (three) times daily with meals.) 30 mL 3  . Insulin Pen Needle 32G X 4 MM MISC 1 Device by Does not apply route in the morning, at noon, in the evening, and at bedtime. 400 each 3  . Lancet Devices (SIMPLE DIAGNOSTICS LANCING DEV) MISC Check BS before and 2 hours after meals. 200 each 6  . latanoprost (XALATAN) 0.005 % ophthalmic solution Place 1 drop into both eyes at bedtime.    . liraglutide (VICTOZA) 18 MG/3ML SOPN Inject 1.8 mg into the skin daily. 27 mL 3  .  losartan (COZAAR) 100 MG tablet Take 100 mg by mouth daily.    Marland Kitchen losartan (COZAAR) 50 MG tablet TAKE 1 TABLET(50 MG) BY MOUTH DAILY (Patient not taking: Reported on 06/10/2021) 90 tablet 1  . Magnesium 400 MG TABS Take 400 mg by mouth daily.    . metoprolol tartrate (LOPRESSOR) 25 MG tablet Take 1 tablet (25 mg total) by mouth 2 (two) times daily. (Patient taking differently: Take 25 mg by mouth daily.) 180 tablet 3  . nitroGLYCERIN (NITROSTAT) 0.4 MG SL tablet Place 1 tablet (0.4 mg total) under the tongue every 5 (five) minutes as needed for chest pain. one tab every 5 minutes up to 3 tablets total over 15 minutes. 25 tablet 3  . ONE TOUCH ULTRA TEST test strip Check BS before and 2 hours after meals. 200 each 6  . sertraline (ZOLOFT) 50 MG tablet Take 1 tablet (50 mg total) by mouth daily. 90 tablet 1   No current facility-administered medications on file prior to visit.     Past Medical History:  Diagnosis Date  . Bilateral carpal tunnel syndrome 03/27/2019  . Boils   . Glaucoma   . Heart murmur   . HTN (hypertension)   . Hx of adenomatous colonic polyps   . Hyperlipidemia   . Osteoporosis   . Pneumonia   .  Stroke ([place])   . Type II or unspecified type diabetes mellitus without mention of complication, uncontrolled    No Known Allergies  Social History   Socioeconomic History  . Marital status: Single    Spouse name: Not on file  . Number of children: Not on file  . Years of education: Not on file  . Highest education level: Not on file  Occupational History  . Not on file  Tobacco Use  . Smoking status: Former    Types: Cigarettes    Quit date: 10/15/2016    Years since quitting: 4.9  . Smokeless tobacco: Never  . Tobacco comments:    smokes occ.   Vaping Use  . Vaping Use: Never used  Substance and Sexual Activity  . Alcohol use: Yes    Alcohol/week: 0.0 standard drinks of alcohol    Comment: occ  . Drug use: No  . Sexual activity: Not on file  Other Topics  Concern  . Not on file  Social History Narrative  . Not on file   Social Determinants of Health   Financial Resource Strain: Low Risk  (04/29/2021)   Overall Financial Resource Strain (CARDIA)   . Difficulty of Paying Living Expenses: Not hard at all  Food Insecurity: No Food Insecurity (04/29/2021)   Hunger Vital Sign   . Worried About Charity fundraiser in the Last Year: Never true   . Ran Out of Food in the Last Year: Never true  Transportation Needs: Unmet Transportation Needs (09/25/2021)   PRAPARE - Transportation   . Lack of Transportation (Medical): Yes   . Lack of Transportation (Non-Medical): Yes  Physical Activity: Insufficiently Active (04/29/2021)   Exercise Vital Sign   . Days of Exercise per Week: 1 day   . Minutes of Exercise per Session: 30 min  Stress: No Stress Concern Present (04/29/2021)   [place]   . Feeling of Stress : Not at all  Social Connections: Socially Isolated (04/29/2021)   Social Connection and Isolation Panel [NHANES]   . Frequency of Communication with Friends and Family: More than three times a week   . Frequency of Social Gatherings with Friends and Family: More than three times a week   . Attends Religious Services: Never   . Active Member of Clubs or Organizations: No   . Attends Archivist Meetings: Never   . Marital Status: Never married    There were no vitals filed for this visit. There is no height or weight on file to calculate BMI.  Physical Exam  ASSESSMENT AND PLAN:  There are no diagnoses linked to this encounter.   No follow-ups on file.   [name]. [name], MD  [facility]. [place] office.  Discharge Instructions   None

## 2021-09-25 NOTE — Patient Outreach (Addendum)
  Care Coordination   Initial Visit Note   09/25/2021 Name: Sarah [name] MRN: [id number] DOB: 03-Sep-1945  Sarah [name] is a 76 y.o. year old female who sees [name], Malka So, MD for primary care. I spoke with  [name] by phone today.  What matters to the patients health and wellness today?  Transportation    Goals Addressed             This Visit's Progress    Obtain Safe and Stable Transportation   On track    Care Coordination Interventions: Solution-Focused Strategies employed:  Active listening / Reflection utilized  Emotional Support Provided LCSW informed patient of care coordination services LCSW informed pt of options for transportation to medical appts. Pt agreed to contact Medicare to inquire about transportation assistance. LCSW will print and provide SCAT application to pt on Monday, Sept 18 during appt with PCP        SDOH assessments and interventions completed:  Yes  SDOH Interventions Today    Flowsheet Row Most Recent Value  SDOH Interventions   Transportation Interventions SCAT ([employer]ized [employer]), Other (Comment)  [Medicare]        Care Coordination Interventions Activated:  Yes  Care Coordination Interventions:  Yes, provided   Follow up plan: Follow up call scheduled for 10/05/21    Encounter Outcome:  Pt. Visit Completed   Christa See, MSW, [email or web address] Phone [phone] 2:42 PM

## 2021-09-28 ENCOUNTER — Ambulatory Visit: Payer: Medicare Other | Admitting: Family Medicine

## 2021-09-28 NOTE — Progress Notes (Unsigned)
ACUTE VISIT Chief Complaint  Patient presents with   Back Pain    X a few weeks, both sides of lower back; having some kidney problems as well.   HPI: [name] is a 76 y.o. female, who is here today complaining of bilateral lower back pain. No hx of trauma before pain started or unusual activity. She had a fall last week when she was walking on her driveway,tripped.She was able to lean against trash can and get up. No serious injuries, she did not seek medical attention. She was already having back pain.  She has had lower back pain intermittent for years but this time seems to be lasting longer. Back Pain This is a recurrent problem. The current episode started 1 to 4 weeks ago. The problem occurs intermittently. The problem has been waxing and waning since onset. The pain is present in the lumbar spine. The quality of the pain is described as aching. The pain does not radiate. The pain is at a severity of 5/10. Associated symptoms include leg pain (chronic). Pertinent negatives include no abdominal pain, bladder incontinence, bowel incontinence, chest pain, dysuria, fever, headaches, numbness, pelvic pain or perianal numbness.   She is still concerned about LE edema. States that her furosemide was increased and 2 new meds were recently added, does not recall names.  She follows with nephrologist, CKD II and proteinuria. She underwent renal Bx, states that result is still pending. She has labs last week and has another lab appt 10/05/21. Lab Results  Component Value Date   CREATININE 0.7 09/16/2021   BUN 10 09/16/2021   NA 142 09/16/2021   K 3.5 09/16/2021   CL 106 09/16/2021   CO2 31 (A) 09/16/2021  Alb 2.7.  Lab Results  Component Value Date   ALT 17 09/22/2020   AST 20 09/22/2020   ALKPHOS 99 09/22/2020   BILITOT <0.2 09/22/2020   Lab Results  Component Value Date   HGBA1C 11.7 (A) 08/31/2021   C/o unsteady gait. She is not using any assistance. This  is not a new problem, has been present since CVA. PT helped.  Review of Systems  Constitutional:  Negative for appetite change, chills and fever.  Respiratory:  Negative for cough, shortness of breath and wheezing.   Cardiovascular:  Negative for chest pain and palpitations.  Gastrointestinal:  Negative for abdominal pain, bowel incontinence, nausea and vomiting.  Endocrine: Negative for cold intolerance and heat intolerance.  Genitourinary:  Negative for bladder incontinence, dysuria and pelvic pain.  Musculoskeletal:  Positive for back pain and gait problem.  Neurological:  Negative for numbness and headaches.  Rest see pertinent positives and negatives per HPI.  Current Outpatient Medications on File Prior to Visit  Medication Sig Dispense Refill   amLODipine (NORVASC) 10 MG tablet TAKE 1 TABLET(10 MG) BY MOUTH DAILY 90 tablet 2   atorvastatin (LIPITOR) 80 MG tablet TAKE 1 TABLET(80 MG) BY MOUTH DAILY AT 6 PM 90 tablet 2   Blood Glucose Calibration (OT ULTRA/FASTTK CNTRL SOLN) SOLN      clopidogrel (PLAVIX) 75 MG tablet TAKE 1 TABLET(75 MG) BY MOUTH DAILY 90 tablet 1   Continuous Blood Gluc Sensor (DEXCOM G6 SENSOR) MISC 1 Device by Does not apply route as directed. 3 each 11   Continuous Blood Gluc Transmit (DEXCOM G6 TRANSMITTER) MISC 1 Device by Does not apply route as directed. 1 each 3   dapagliflozin propanediol (FARXIGA) 10 MG TABS tablet Take 1 tablet (10 mg total)  by mouth daily. 90 tablet 3   ezetimibe (ZETIA) 10 MG tablet TAKE 1 TABLET(10 MG) BY MOUTH DAILY 90 tablet 3   furosemide (LASIX) 20 MG tablet TAKE 1 TABLET(20 MG) BY MOUTH DAILY (Patient taking differently: 40 mg.) 30 tablet 2   insulin glargine, 1 Unit Dial, (TOUJEO SOLOSTAR) 300 UNIT/ML Solostar Pen Inject 80 Units into the skin daily in the afternoon. 30 mL 3   insulin lispro (HUMALOG KWIKPEN) 200 UNIT/ML KwikPen Max daily 50 units (Patient taking differently: Inject 14 Units into the skin 3 (three) times daily  with meals.) 30 mL 3   Insulin Pen Needle 32G X 4 MM MISC 1 Device by Does not apply route in the morning, at noon, in the evening, and at bedtime. 400 each 3   Lancet Devices (SIMPLE DIAGNOSTICS LANCING DEV) MISC Check BS before and 2 hours after meals. 200 each 6   latanoprost (XALATAN) 0.005 % ophthalmic solution Place 1 drop into both eyes at bedtime.     liraglutide (VICTOZA) 18 MG/3ML SOPN Inject 1.8 mg into the skin daily. 27 mL 3   losartan (COZAAR) 100 MG tablet Take 100 mg by mouth daily.     losartan (COZAAR) 50 MG tablet TAKE 1 TABLET(50 MG) BY MOUTH DAILY 90 tablet 1   Magnesium 400 MG TABS Take 400 mg by mouth daily.     nitroGLYCERIN (NITROSTAT) 0.4 MG SL tablet Place 1 tablet (0.4 mg total) under the tongue every 5 (five) minutes as needed for chest pain. one tab every 5 minutes up to 3 tablets total over 15 minutes. 25 tablet 3   ONE TOUCH ULTRA TEST test strip Check BS before and 2 hours after meals. 200 each 6   sertraline (ZOLOFT) 50 MG tablet Take 1 tablet (50 mg total) by mouth daily. 90 tablet 1   metoprolol tartrate (LOPRESSOR) 25 MG tablet Take 1 tablet (25 mg total) by mouth 2 (two) times daily. (Patient taking differently: Take 25 mg by mouth daily.) 180 tablet 3   No current facility-administered medications on file prior to visit.   Past Medical History:  Diagnosis Date   Bilateral carpal tunnel syndrome 03/27/2019   Boils    Glaucoma    Heart murmur    HTN (hypertension)    Hx of adenomatous colonic polyps    Hyperlipidemia    Osteoporosis    Pneumonia    Stroke ([place])    Type II or unspecified type diabetes mellitus without mention of complication, uncontrolled    No Known Allergies  Social History   Socioeconomic History   Marital status: Single    Spouse name: Not on file   Number of children: Not on file   Years of education: Not on file   Highest education level: Not on file  Occupational History   Not on file  Tobacco Use   Smoking status:  Former    Types: Cigarettes    Quit date: 10/15/2016    Years since quitting: 4.9   Smokeless tobacco: Never   Tobacco comments:    smokes occ.   Vaping Use   Vaping Use: Never used  Substance and Sexual Activity   Alcohol use: Yes    Alcohol/week: 0.0 standard drinks of alcohol    Comment: occ   Drug use: No   Sexual activity: Not on file  Other Topics Concern   Not on file  Social History Narrative   Not on file   Social Determinants of Health  Financial Resource Strain: Low Risk  (04/29/2021)   Overall Financial Resource Strain (CARDIA)    Difficulty of Paying Living Expenses: Not hard at all  Food Insecurity: No Food Insecurity (04/29/2021)   Hunger Vital Sign    Worried About Running Out of Food in the Last Year: Never true    Ran Out of Food in the Last Year: Never true  Transportation Needs: Unmet Transportation Needs (09/25/2021)   PRAPARE - Hydrologist (Medical): Yes    Lack of Transportation (Non-Medical): Yes  Physical Activity: Insufficiently Active (04/29/2021)   Exercise Vital Sign    Days of Exercise per Week: 1 day    Minutes of Exercise per Session: 30 min  Stress: No Stress Concern Present (04/29/2021)   [place]    Feeling of Stress : Not at all  Social Connections: Socially Isolated (04/29/2021)   Social Connection and Isolation Panel [NHANES]    Frequency of Communication with Friends and Family: More than three times a week    Frequency of Social Gatherings with Friends and Family: More than three times a week    Attends Religious Services: Never    Marine scientist or Organizations: No    Attends Archivist Meetings: Never    Marital Status: Never married   Vitals:   09/29/21 1354  BP: 126/70  Pulse: 82  Resp: 12  SpO2: 97%   Body mass index is 25.44 kg/m.  Physical Exam Vitals and nursing note reviewed.  Constitutional:       General: She is not in acute distress.    Appearance: She is well-developed.  HENT:     Head: Normocephalic and atraumatic.     Mouth/Throat:     Mouth: Mucous membranes are moist.     Dentition: Abnormal dentition.     Pharynx: Oropharynx is clear.  Eyes:     Conjunctiva/sclera: Conjunctivae normal.  Cardiovascular:     Rate and Rhythm: Normal rate and regular rhythm.     Pulses:          Dorsalis pedis pulses are 2+ on the right side and 2+ on the left side.     Heart sounds: Murmur (soft SEM LUSB) heard.  Pulmonary:     Effort: Pulmonary effort is normal. No respiratory distress.     Breath sounds: Normal breath sounds.  Abdominal:     Palpations: Abdomen is soft. There is no hepatomegaly or mass.     Tenderness: There is no abdominal tenderness.  Musculoskeletal:       Back:     Right lower leg: 2+ Pitting Edema present.     Left lower leg: 2+ Pitting Edema present.  Skin:    General: Skin is warm.     Findings: No erythema or rash.  Neurological:     General: No focal deficit present.     Mental Status: She is alert and oriented to person, place, and time.     Cranial Nerves: No cranial nerve deficit.     Comments: Ustable gait, not assisted.   Psychiatric:        Mood and Affect: Affect normal. Mood is anxious.   ASSESSMENT AND PLAN:  [name] was seen today for back pain.  Diagnoses and all orders for this visit: Orders Placed This Encounter  Procedures   DG Lumbar Spine Complete   Ambulatory referral to Physical Therapy    Bilateral low back  pain, unspecified chronicity, unspecified whether sciatica present Chronic, seems to be worse this time. Lumbar X ray done today. PT will be arranged. Because her comorbilities we do not have many options for pain management. Recommend Tylenol 500 mg 3-4 times per day and OTC Icy hot patch or asper cream. Instructed about warning signs.  Risk for falls Fall precautions discussed. PT will be  arranged.  Bilateral lower extremity edema Chronic. We discussed causes,renal disease/proteinuria. I do not recommend increasing dose of Furosemide, she is already taking 40 mg  daily. Compression stocking may help. Educated about appropriate skin care.  Proteinuria, unspecified type Protein/Cr ration on 09/15/21 was 18029 (0-200). According to pt, 2 new meds were added, I reviewed copy of last visit and I did not see new meds listed. DM II needs to be better controlled.  Following with nephrologist. Pending labs to be repeated 10/05/21.  Unstable gait PT will be arranged. Some of her chronic medical problems can be contributing factors, CVD,peripheral neuropathy,and OA. Fall precautions discussed.  Recommend using her walker.  I spent a total of 43 minutes in both face to face and non face to face activities for this visit on the date of this encounter. During this time history was obtained and documented, examination was performed, prior labs, and assessment/plan discussed.  Return if symptoms worsen or fail to improve.  Harriet Bollen G. [name], MD  [facility]. [place] office.

## 2021-09-29 ENCOUNTER — Ambulatory Visit (INDEPENDENT_AMBULATORY_CARE_PROVIDER_SITE_OTHER): Payer: Medicare Other

## 2021-09-29 ENCOUNTER — Encounter: Payer: Self-pay | Admitting: Family Medicine

## 2021-09-29 ENCOUNTER — Ambulatory Visit (INDEPENDENT_AMBULATORY_CARE_PROVIDER_SITE_OTHER): Payer: Medicare Other | Admitting: Family Medicine

## 2021-09-29 VITALS — BP 126/70 | HR 82 | Resp 12 | Ht 60.0 in | Wt 130.2 lb

## 2021-09-29 DIAGNOSIS — M545 Low back pain, unspecified: Secondary | ICD-10-CM

## 2021-09-29 DIAGNOSIS — R600 Localized edema: Secondary | ICD-10-CM

## 2021-09-29 DIAGNOSIS — R2681 Unsteadiness on feet: Secondary | ICD-10-CM

## 2021-09-29 DIAGNOSIS — Z9181 History of falling: Secondary | ICD-10-CM

## 2021-09-29 DIAGNOSIS — R809 Proteinuria, unspecified: Secondary | ICD-10-CM

## 2021-09-29 NOTE — Patient Instructions (Addendum)
A few things to remember from today's visit:  Risk for falls - Plan: Ambulatory referral to Physical Therapy  Bilateral low back pain, unspecified chronicity, unspecified whether sciatica present - Plan: Ambulatory referral to Physical Therapy, DG Lumbar Spine Complete  Bilateral lower extremity edema  If you need refills for medications you take chronically, please call your pharmacy. Do not use My Chart to request refills or for acute issues that need immediate attention. If you send a my chart message, it may take a few days to be addressed, specially if I am not in the office.  Please be sure medication list is accurate. If a new problem present, please set up appointment sooner than planned today.  Fall precautions. I placed referral for PT. You need a walker. Tylenol 500 mg 3-4 times per day. Icy hot patch on back may also help.  Compression stocking may help with swelling. Decreased sweets and sugar added foods.

## 2021-09-30 NOTE — Progress Notes (Signed)
Cardiology Office Note:    Date:  10/07/2021   ID:  Sarah [name], DOB 20-Nov-1945, MRN [id number]  PCP:  [name], [name], [place] [facility] Cardiologist:  Werner Lean, MD     Referring MD: [name], [name], MD   Chief Complaint:  No chief complaint on file.     History of Present Illness:   Sarah [name] is a 75 y.o. female with a hx of DM with HTN, Prior Stroke in 2018, Aortic atherosclerosis had echocardiogram that was relatively benign and a positive CCTA for RCA disease, had LHC 04/2020 and DES RCA residual 50% Cfx   patient had normal vascular duplex.     Patient saw Dr. Gasper Sells 03/2021 with dizziness and new heart murmur. Echo with Normal LVEF G1DD, sm pericardial effusion, mild MR, aortic valve sclerosis. Carotids stable 1-39% bilateral. F/u in 3 yrs.  Patient is on my schedule for preop clearance for colonoscopy 11/11/21 colonoscopy  by Dr. Rush Landmark and needs to hold plavix for 5 days. Dr. Gasper Sells ok'ed plavix hold for renal biopsy in June. Patient still complains of being very unsteady since her CVA 2018. No chest pain, sometimes has to take a deep breath going upstairs. Has some ankle swelling. Drinks a lot of soda. On low dose lasix and has compression stockings. Amlodipine increased 10 mg 6 months ago when swelling started.      Past Medical History:  Diagnosis Date   Bilateral carpal tunnel syndrome 03/27/2019   Boils    Glaucoma    Heart murmur    HTN (hypertension)    Hx of adenomatous colonic polyps    Hyperlipidemia    Osteoporosis    Pneumonia    Stroke ([place])    Type II or unspecified type diabetes mellitus without mention of complication, uncontrolled    Current Medications: Current Meds  Medication Sig   amLODipine (NORVASC) 5 MG tablet Take 1 tablet (5 mg total) by mouth daily.   atorvastatin (LIPITOR) 80 MG tablet TAKE 1 TABLET(80 MG) BY MOUTH DAILY AT 6 PM   Blood Glucose Calibration (OT ULTRA/FASTTK  CNTRL SOLN) SOLN    clopidogrel (PLAVIX) 75 MG tablet TAKE 1 TABLET(75 MG) BY MOUTH DAILY   Continuous Blood Gluc Sensor (DEXCOM G6 SENSOR) MISC 1 Device by Does not apply route as directed.   Continuous Blood Gluc Transmit (DEXCOM G6 TRANSMITTER) MISC 1 Device by Does not apply route as directed.   dapagliflozin propanediol (FARXIGA) 10 MG TABS tablet Take 1 tablet (10 mg total) by mouth daily.   ezetimibe (ZETIA) 10 MG tablet TAKE 1 TABLET(10 MG) BY MOUTH DAILY   furosemide (LASIX) 20 MG tablet TAKE 1 TABLET(20 MG) BY MOUTH DAILY (Patient taking differently: 40 mg.)   hydrALAZINE (APRESOLINE) 10 MG tablet Take 1 tablet (10 mg total) by mouth 3 (three) times daily.   insulin glargine, 1 Unit Dial, (TOUJEO SOLOSTAR) 300 UNIT/ML Solostar Pen Inject 80 Units into the skin daily in the afternoon.   insulin lispro (HUMALOG KWIKPEN) 200 UNIT/ML KwikPen Max daily 50 units (Patient taking differently: Inject 14 Units into the skin 3 (three) times daily with meals.)   Insulin Pen Needle 32G X 4 MM MISC 1 Device by Does not apply route in the morning, at noon, in the evening, and at bedtime.   Lancet Devices (SIMPLE DIAGNOSTICS LANCING DEV) MISC Check BS before and 2 hours after meals.   latanoprost (XALATAN) 0.005 % ophthalmic solution Place 1 drop into both  eyes at bedtime.   liraglutide (VICTOZA) 18 MG/3ML SOPN Inject 1.8 mg into the skin daily.   losartan (COZAAR) 100 MG tablet Take 100 mg by mouth daily.   losartan (COZAAR) 50 MG tablet TAKE 1 TABLET(50 MG) BY MOUTH DAILY   Magnesium 400 MG TABS Take 400 mg by mouth daily.   Na Sulfate-K Sulfate-Mg Sulf (SUPREP BOWEL PREP KIT) 17.5-3.13-1.6 GM/177ML SOLN Take 1 kit by mouth as directed.   nitroGLYCERIN (NITROSTAT) 0.4 MG SL tablet Place 1 tablet (0.4 mg total) under the tongue every 5 (five) minutes as needed for chest pain. one tab every 5 minutes up to 3 tablets total over 15 minutes.   ondansetron (ZOFRAN) 4 MG tablet Take 1 tablet (4 mg total) by  mouth every 8 (eight) hours as needed for nausea or vomiting.   ONE TOUCH ULTRA TEST test strip Check BS before and 2 hours after meals.   sertraline (ZOLOFT) 50 MG tablet Take 1 tablet (50 mg total) by mouth daily.   [DISCONTINUED] amLODipine (NORVASC) 10 MG tablet TAKE 1 TABLET(10 MG) BY MOUTH DAILY    Allergies:   Patient has no known allergies.   Social History   Tobacco Use   Smoking status: Former    Types: Cigarettes    Quit date: 10/15/2016    Years since quitting: 4.9   Smokeless tobacco: Never   Tobacco comments:    smokes occ.   Vaping Use   Vaping Use: Never used  Substance Use Topics   Alcohol use: Yes    Alcohol/week: 0.0 standard drinks of alcohol    Comment: occ   Drug use: No    Family Hx: The patient's family history includes Diabetes in her mother; Hypertension in her mother; Stroke in her father, maternal uncle, and paternal uncle. There is no history of Colon cancer, Esophageal cancer, Rectal cancer, Stomach cancer, Inflammatory bowel disease, Liver disease, or Pancreatic cancer.  ROS   EKGs/Labs/Other Test Reviewed:    EKG:  EKG is   ordered today.  The ekg ordered today demonstrates NSR with TWI inflat  Recent Labs: 06/15/2021: Hemoglobin 11.2; Platelets 329 09/16/2021: BUN 10; Creatinine 0.7; Potassium 3.5; Sodium 142   Recent Lipid Panel Recent Labs    12/26/20 0731  CHOL 190  TRIG 143  HDL 78  LDLCALC 88     Prior CV Studies:    Cath 03/2020 Prox RCA lesion is 50% stenosed. Mid RCA lesion is 90% stenosed. A drug-eluting stent was successfully placed using a STENT RESOLUTE ONYX 3.0X30, post dilated to 3.75 mm, and optimized with IVUS. Post intervention, there is a 0% residual stenosis. Prox Cx lesion is 50% stenosed. This looks more significant in the caudal views. CT FFR of the circumflex was negative. Mid Cx lesion is 25% stenosed. Prox LAD lesion is 25% stenosed. The left ventricular systolic function is normal. LV end diastolic  pressure is normal. LVEDP 14 mm Hg. The left ventricular ejection fraction is 55-65% by visual estimate. There is no aortic valve stenosis.   Successful PCI of the RCA.  Moderate disease in the proximal circumflex, if she had persistent symptoms, could reevaluate this vessel.   Severe bend in the LAD which may have affected CT FFR calculation.   I discussed the findings with the patient's sister.  Continue dual antiplatelet therapy along with aggressive secondary prevention.  Plan for same-day discharge.  Echo 03/2021 IMPRESSIONS     1. Left ventricular ejection fraction, by estimation, is 60 to 65%.  The  left ventricle has normal function. The left ventricle has no regional  wall motion abnormalities. There is moderate concentric left ventricular  hypertrophy. Left ventricular  diastolic parameters are consistent with Grade I diastolic dysfunction  (impaired relaxation).   2. Right ventricular systolic function is normal. The right ventricular  size is normal. Tricuspid regurgitation signal is inadequate for assessing  PA pressure.   3. A small pericardial effusion is present. The pericardial effusion is  circumferential.   4. The mitral valve is normal in structure. Mild mitral valve  regurgitation.   5. The aortic valve is tricuspid. There is mild calcification of the  aortic valve. There is moderate thickening of the aortic valve. Aortic  valve regurgitation is not visualized. Aortic valve sclerosis is present,  with no evidence of aortic valve  stenosis.   6. The inferior vena cava is normal in size with greater than 50%  respiratory variability, suggesting right atrial pressure of 3 mmHg.   Comparison(s): No significant change from prior study. Murmur may be  related to aortic sclerosis.   FINDINGS   Left Ventricle: Left ventricular ejection fraction, by estimation, is 60  to 65%. The left ventricle has normal function. The left ventricle has no  regional wall motion  abnormalities. The left ventricular internal cavity  size was normal in size. There is   moderate concentric left ventricular hypertrophy. Left ventricular  diastolic parameters are consistent with Grade I diastolic dysfunction  (impaired relaxation).   Right Ventricle: The right ventricular size is normal. No increase in  right ventricular wall thickness. Right ventricular systolic function is  normal. Tricuspid regurgitation signal is inadequate for assessing PA  pressure.   Left Atrium: Left atrial size was normal in size.   Right Atrium: Right atrial size was normal in size.   Pericardium: A small pericardial effusion is present. The pericardial  effusion is circumferential.   Mitral Valve: The mitral valve is normal in structure. Mild mitral valve  regurgitation.   Tricuspid Valve: The tricuspid valve is normal in structure. Tricuspid  valve regurgitation is trivial. No evidence of tricuspid stenosis.   Aortic Valve: The aortic valve is tricuspid. There is mild calcification  of the aortic valve. There is moderate thickening of the aortic valve.  There is mild aortic valve annular calcification. Aortic valve  regurgitation is not visualized. Aortic valve  sclerosis is present, with no evidence of aortic valve stenosis.   Pulmonic Valve: The pulmonic valve was normal in structure. Pulmonic valve  regurgitation is trivial. No evidence of pulmonic stenosis.   Aorta: The aortic root and ascending aorta are structurally normal, with  no evidence of dilitation.   Venous: The inferior vena cava is normal in size with greater than 50%  respiratory variability, suggesting right atrial pressure of 3 mmHg.   IAS/Shunts: No atrial level shunt detected by color flow Doppler.       Risk Assessment/Calculations/Metrics:              Physical Exam:    VS:  BP 132/82   Pulse 78   Ht 5' (1.524 m)   Wt 124 lb (56.2 kg)   SpO2 98%   BMI 24.22 kg/m     Wt Readings from Last  3 Encounters:  10/07/21 124 lb (56.2 kg)  10/01/21 128 lb (58.1 kg)  09/29/21 130 lb 4 oz (59.1 kg)    Physical Exam  GEN: Well nourished, well developed, in no acute distress  Neck: no  JVD, carotid bruits, or masses Cardiac:RRR; no murmurs, rubs, or gallops  Respiratory:  clear to auscultation bilaterally, normal work of breathing GI: soft, nontender, nondistended, + BS Ext: trace ankle edema, without cyanosis, clubbing, or  Good distal Neuro:  Alert and Oriented x 3,  Psych: euthymic mood, full affect       ASSESSMENT & PLAN:   No problem-specific Assessment & Plan notes found for this encounter.    Preop clearance for colonoscopy by Dr. Rush Landmark 11/2021 Patient without chest pain. She was cleared to hold her plavix for 5 days in June for renal biopsy. She has had ankle edema since amlodipine increased. Will decrease back to 5 mg daily and add hydralazine 10 mg tid for BP. Recent echo with normal LVEF, G1DD. F/u with me in 2 weeks. Otherwise she can proceed with colonoscopy without further cardiac testing. According to the Revised Cardiac Risk Index (RCRI), her Perioperative Risk of Major Cardiac Event is (%): 6.6  Her Functional Capacity in METs is: 6.05 according to the [facility] Activity Status Index (DASI).    CAD DES RCA 03/2020 residual 50% RCA-no angina on plavix  Leg edema since amlodipine increased to 10 mg daily. Watching salt and on low dose lasix. Decrease amlodipine 5 mg daily and add hydralazine for BP control.   HTN-controlled but adjusting meds as above  DM per PCP  History of CVA 2018-and still off balance. Was advised to get a cane and walker. Have her f/u with Dr. Leonie Man. Maybe she can benefit from more PT.           Dispo:  No follow-ups on file.   Medication Adjustments/Labs and Tests Ordered: Current medicines are reviewed at length with the patient today.  Concerns regarding medicines are outlined above.  Tests Ordered: Orders Placed This Encounter   Procedures   EKG 12-Lead   Medication Changes: Meds ordered this encounter  Medications   amLODipine (NORVASC) 5 MG tablet    Sig: Take 1 tablet (5 mg total) by mouth daily.    Dispense:  90 tablet    Refill:  3   hydrALAZINE (APRESOLINE) 10 MG tablet    Sig: Take 1 tablet (10 mg total) by mouth 3 (three) times daily.    Dispense:  240 tablet    Refill:  3   Signed, Ermalinda Barrios, PA-C  10/07/2021 1:47 PM    [address], [place], [place]  [address] Phone: [phone]; Fax: [phone]

## 2021-10-01 ENCOUNTER — Ambulatory Visit: Payer: Medicare Other | Admitting: Gastroenterology

## 2021-10-01 ENCOUNTER — Encounter: Payer: Self-pay | Admitting: Gastroenterology

## 2021-10-01 ENCOUNTER — Ambulatory Visit (INDEPENDENT_AMBULATORY_CARE_PROVIDER_SITE_OTHER): Payer: Medicare Other | Admitting: Gastroenterology

## 2021-10-01 DIAGNOSIS — Z86010 Personal history of colonic polyps: Secondary | ICD-10-CM

## 2021-10-01 DIAGNOSIS — K5909 Other constipation: Secondary | ICD-10-CM

## 2021-10-01 DIAGNOSIS — E1165 Type 2 diabetes mellitus with hyperglycemia: Secondary | ICD-10-CM | POA: Diagnosis not present

## 2021-10-01 DIAGNOSIS — Z7902 Long term (current) use of antithrombotics/antiplatelets: Secondary | ICD-10-CM | POA: Diagnosis not present

## 2021-10-01 DIAGNOSIS — Z860101 Personal history of adenomatous and serrated colon polyps: Secondary | ICD-10-CM

## 2021-10-01 DIAGNOSIS — Z794 Long term (current) use of insulin: Secondary | ICD-10-CM | POA: Diagnosis not present

## 2021-10-01 MED ORDER — ONDANSETRON HCL 4 MG PO TABS: 4.0000 mg | ORAL_TABLET | Freq: Three times a day (TID) | ORAL | 0 refills | Status: DC | PRN

## 2021-10-01 NOTE — Progress Notes (Signed)
[facility] VISIT   Primary Care Provider [name], [name], [place] [address] [phone]   Patient Profile: Sarah [name] is a 76 y.o. female with a pmh significant for prior CVA (on Plavix), CAD (on Plavix), hypertension, hyperlipidemia, diabetes, chronic constipation, colon polyps (TAs).  The patient presents to the [facility] for an evaluation and management of problem(s) noted below:  Problem List 1. Hx of adenomatous colonic polyps   2. Chronic constipation   3. Long term current use of antithrombotics/antiplatelets      History of Present Illness Please see prior notes for full details of HPI.  Interval History The patient returns for follow-up.  Unfortunately due to missed directions, the patient was 1 hour late for her visit today.  She had a poor preparation for her last colonoscopy even though she was able to tolerate the entire Suprep.  She describes constipation issues on a regular basis with bowel movements every 2 to 3 days (previously she had said on an every other day basis).  She has not been using laxative therapy regularly because her body will let things move normally.  She describes no significant abdominal pain or discomfort otherwise.  During that colonoscopy we did find a small tubular adenoma that was removed though others could have been missed due to the poor preparation.  She does not use significant nonsteroidals or BC/Goody powders.  She is not experiencing any other significant GI symptoms currently.  She is interested in continuing colon cancer screening and colon polyp surveillance due to previously found precancerous polyps.  GI Review of Systems Positive as above Negative for dysphagia, odynophagia, melena, hematochezia   Review of Systems General: Denies fevers/chills/weight loss unintentionally Cardiovascular: Denies chest pain Pulmonary: Denies shortness of  breath Gastroenterological: See HPI Genitourinary: Denies darkened urine Hematological: Positive for history of easy bruising/bleeding due to Plavix Dermatological: Denies jaundice Psychological: Mood is stable   Medications Current Outpatient Medications  Medication Sig Dispense Refill   amLODipine (NORVASC) 10 MG tablet TAKE 1 TABLET(10 MG) BY MOUTH DAILY 90 tablet 2   atorvastatin (LIPITOR) 80 MG tablet TAKE 1 TABLET(80 MG) BY MOUTH DAILY AT 6 PM 90 tablet 2   Blood Glucose Calibration (OT ULTRA/FASTTK CNTRL SOLN) SOLN      clopidogrel (PLAVIX) 75 MG tablet TAKE 1 TABLET(75 MG) BY MOUTH DAILY 90 tablet 1   Continuous Blood Gluc Sensor (DEXCOM G6 SENSOR) MISC 1 Device by Does not apply route as directed. 3 each 11   Continuous Blood Gluc Transmit (DEXCOM G6 TRANSMITTER) MISC 1 Device by Does not apply route as directed. 1 each 3   dapagliflozin propanediol (FARXIGA) 10 MG TABS tablet Take 1 tablet (10 mg total) by mouth daily. 90 tablet 3   ezetimibe (ZETIA) 10 MG tablet TAKE 1 TABLET(10 MG) BY MOUTH DAILY 90 tablet 3   furosemide (LASIX) 20 MG tablet TAKE 1 TABLET(20 MG) BY MOUTH DAILY (Patient taking differently: 40 mg.) 30 tablet 2   insulin glargine, 1 Unit Dial, (TOUJEO SOLOSTAR) 300 UNIT/ML Solostar Pen Inject 80 Units into the skin daily in the afternoon. 30 mL 3   insulin lispro (HUMALOG KWIKPEN) 200 UNIT/ML KwikPen Max daily 50 units (Patient taking differently: Inject 14 Units into the skin 3 (three) times daily with meals.) 30 mL 3   Insulin Pen Needle 32G X 4 MM MISC 1 Device by Does not apply route in the morning, at noon, in the evening, and at  bedtime. 400 each 3   Lancet Devices (SIMPLE DIAGNOSTICS LANCING DEV) MISC Check BS before and 2 hours after meals. 200 each 6   latanoprost (XALATAN) 0.005 % ophthalmic solution Place 1 drop into both eyes at bedtime.     liraglutide (VICTOZA) 18 MG/3ML SOPN Inject 1.8 mg into the skin daily. 27 mL 3   losartan (COZAAR) 100 MG tablet  Take 100 mg by mouth daily.     losartan (COZAAR) 50 MG tablet TAKE 1 TABLET(50 MG) BY MOUTH DAILY 90 tablet 1   Magnesium 400 MG TABS Take 400 mg by mouth daily.     nitroGLYCERIN (NITROSTAT) 0.4 MG SL tablet Place 1 tablet (0.4 mg total) under the tongue every 5 (five) minutes as needed for chest pain. one tab every 5 minutes up to 3 tablets total over 15 minutes. 25 tablet 3   ondansetron (ZOFRAN) 4 MG tablet Take 1 tablet (4 mg total) by mouth every 8 (eight) hours as needed for nausea or vomiting. 20 tablet 0   ONE TOUCH ULTRA TEST test strip Check BS before and 2 hours after meals. 200 each 6   sertraline (ZOLOFT) 50 MG tablet Take 1 tablet (50 mg total) by mouth daily. 90 tablet 1   metoprolol tartrate (LOPRESSOR) 25 MG tablet Take 1 tablet (25 mg total) by mouth 2 (two) times daily. (Patient taking differently: Take 25 mg by mouth daily.) 180 tablet 3   No current facility-administered medications for this visit.    Allergies No Known Allergies  Histories Past Medical History:  Diagnosis Date   Bilateral carpal tunnel syndrome 03/27/2019   Boils    Glaucoma    Heart murmur    HTN (hypertension)    Hx of adenomatous colonic polyps    Hyperlipidemia    Osteoporosis    Pneumonia    Stroke ([place])    Type II or unspecified type diabetes mellitus without mention of complication, uncontrolled    Past Surgical History:  Procedure Laterality Date   ABDOMINAL HYSTERECTOMY     CARPAL TUNNEL RELEASE     right   COLONOSCOPY  12-10-10   per Dr. Deatra Ina, clear, repeat in 7 yrs    CORONARY STENT INTERVENTION N/A 03/11/2020   Procedure: CORONARY STENT INTERVENTION;  Surgeon: Jettie Booze, MD;  Location: [facility];  Service: Cardiovascular;  Laterality: N/A;   INCISION AND DRAINAGE PERIRECTAL ABSCESS N/A 09/26/2015   Procedure: IRRIGATION AND DEBRIDEMENT PERIRECTAL ABSCESS;  Surgeon: Mickeal Skinner, MD;  Location: [facility];  Service: General;  Laterality: N/A;    INTRAVASCULAR ULTRASOUND/IVUS N/A 03/11/2020   Procedure: Intravascular Ultrasound/IVUS;  Surgeon: Jettie Booze, MD;  Location: [facility];  Service: Cardiovascular;  Laterality: N/A;   KNEE ARTHROSCOPY     right   LEFT HEART CATH AND CORONARY ANGIOGRAPHY N/A 03/11/2020   Procedure: LEFT HEART CATH AND CORONARY ANGIOGRAPHY;  Surgeon: Jettie Booze, MD;  Location: [facility] [facility];  Service: Cardiovascular;  Laterality: N/A;   LOOP RECORDER INSERTION N/A 10/19/2016   Procedure: LOOP RECORDER INSERTION;  Surgeon: Thompson Grayer, MD;  Location: [facility];  Service: Cardiovascular;  Laterality: N/A;   ORIF ANKLE FRACTURE Right 03/24/2012   Procedure: OPEN REDUCTION INTERNAL FIXATION (ORIF) ANKLE FRACTURE;  Surgeon: Newt Minion, MD;  Location: [name];  Service: Orthopedics;  Laterality: Right;  Open Reduction Internal Fixation Right Bimalleolar ankle fracture   POLYPECTOMY     TEE WITHOUT CARDIOVERSION N/A 10/18/2016  Procedure: TRANSESOPHAGEAL ECHOCARDIOGRAM (TEE);  Surgeon: Fay Records, MD;  Location: [facility];  Service: Cardiovascular;  Laterality: N/A;   TONSILLECTOMY     Social History   Socioeconomic History   Marital status: Single    Spouse name: Not on file   Number of children: Not on file   Years of education: Not on file   Highest education level: Not on file  Occupational History   Not on file  Tobacco Use   Smoking status: Former    Types: Cigarettes    Quit date: 10/15/2016    Years since quitting: 4.9   Smokeless tobacco: Never   Tobacco comments:    smokes occ.   Vaping Use   Vaping Use: Never used  Substance and Sexual Activity   Alcohol use: Yes    Alcohol/week: 0.0 standard drinks of alcohol    Comment: occ   Drug use: No   Sexual activity: Not on file  Other Topics Concern   Not on file  Social History Narrative   Not on file   Social Determinants of Health   Financial Resource Strain: Low Risk  (04/29/2021)   Overall  Financial Resource Strain (CARDIA)    Difficulty of Paying Living Expenses: Not hard at all  Food Insecurity: No Food Insecurity (04/29/2021)   Hunger Vital Sign    Worried About Running Out of Food in the Last Year: Never true    [place] in the Last Year: Never true  Transportation Needs: Unmet Transportation Needs (09/25/2021)   PRAPARE - Transportation    Lack of Transportation (Medical): Yes    Lack of Transportation (Non-Medical): Yes  Physical Activity: Insufficiently Active (04/29/2021)   Exercise Vital Sign    Days of Exercise per Week: 1 day    Minutes of Exercise per Session: 30 min  Stress: No Stress Concern Present (04/29/2021)   [place]    Feeling of Stress : Not at all  Social Connections: Socially Isolated (04/29/2021)   Social Connection and Isolation Panel [NHANES]    Frequency of Communication with Friends and Family: More than three times a week    Frequency of Social Gatherings with Friends and Family: More than three times a week    Attends Religious Services: Never    Marine scientist or Organizations: No    Attends Archivist Meetings: Never    Marital Status: Never married  Intimate Partner Violence: Not At Risk (04/29/2021)   Humiliation, Afraid, Rape, and Kick questionnaire    Fear of Current or Ex-Partner: No    Emotionally Abused: No    Physically Abused: No    Sexually Abused: No   Family History  Problem Relation Age of Onset   Diabetes Mother    Hypertension Mother    Stroke Father    Stroke Maternal Uncle    Stroke Paternal Uncle    Colon cancer Neg Hx    Esophageal cancer Neg Hx    Rectal cancer Neg Hx    Stomach cancer Neg Hx    Inflammatory bowel disease Neg Hx    Liver disease Neg Hx    Pancreatic cancer Neg Hx    I have reviewed her medical, social, and family history in detail and updated the electronic medical record as necessary.    PHYSICAL  EXAMINATION  BP 126/72   Pulse 95   Ht 5' (1.524 m)   Wt 128 lb (58.1  kg)   BMI 25.00 kg/m  Wt Readings from Last 3 Encounters:  10/01/21 128 lb (58.1 kg)  09/29/21 130 lb 4 oz (59.1 kg)  08/31/21 124 lb (56.2 kg)  GEN: NAD, appears stated age, doesn't appear chronically ill PSYCH: Cooperative, without pressured speech EYE: Conjunctivae pink, sclerae anicteric ENT: MMM CV: Nontachycardic RESP: No audible wheezing GI: NABS, soft, NT/ND, without rebound MSK/EXT: No lower extremity edema SKIN: No jaundice NEURO:  Alert & Oriented x 3, no focal deficits   REVIEW OF DATA  I reviewed the following data at the time of this encounter:  GI Procedures and Studies  February 2023 colonoscopy - Preparation of the colon was unsatisfactory as stool was in the entire colon. Could not get an adequate visualization unfortunately. - Hemorrhoids found on digital rectal exam. - Hemorrhoids found on digital rectal exam. - Stool in the entire examined colon. - One 3 mm polyp at the hepatic flexure, removed with a cold snare. Resected and retrieved. - Non-bleeding non-thrombosed external and internal hemorrhoids.  Pathology Diagnosis Surgical [P], colon, hepatic flexure, polyp (1) - TUBULAR ADENOMA - NEGATIVE FOR HIGH-GRADE DYSPLASIA OR MALIGNANCY - MULTIPLE STEP SECTIONS WERE EXAMINED  Laboratory Studies  Reviewed those in epic and care everywhere  Imaging Studies  No relevant studies to review   ASSESSMENT  Ms. Savary is a 76 y.o. female with a pmh significant for prior CVA (on Plavix), CAD (on Plavix), hypertension, hyperlipidemia, diabetes, chronic constipation, colon polyps (TAs).  The patient is seen today for evaluation and management of:  1. Hx of adenomatous colonic polyps   2. Chronic constipation   3. Long term current use of antithrombotics/antiplatelets    The patient is hemodynamically and clinically stable.  We are going to try to optimize her bowel habits with a  bowel regimen and then pursue a 2-day colonoscopy preparation in effort of getting her fully up-to-date from a colon cancer screening and colon polyp surveillance standpoint in the setting of her previous adenomatous colon polyps.  We will obtain approval for Plavix hold once again as she did earlier this year.  The risks and benefits of endoscopic evaluation were discussed with the patient; these include but are not limited to the risk of perforation, infection, bleeding, missed lesions, lack of diagnosis, severe illness requiring hospitalization, as well as anesthesia and sedation related illnesses.  The patient and/or family is agreeable to proceed.  All patient questions were answered to the best of my ability, and the patient agrees to the aforementioned plan of action with follow-up as indicated.   PLAN  Initiate fiber supplementation daily If no bowel movement after 2 days then should have MiraLAX and Dulcolax available to use as needed Patient will undergo a 2-day preparation as outlined with patient okay for light breakfast and light lunch 2 days prior to procedure before undergoing clear liquid diet thereafter Will obtain approval for Plavix hold x5 days as we did earlier this year Zofran available should patient have nausea symptoms to take prepreparation   No orders of the defined types were placed in this encounter.   New Prescriptions   ONDANSETRON (ZOFRAN) 4 MG TABLET    Take 1 tablet (4 mg total) by mouth every 8 (eight) hours as needed for nausea or vomiting.   Modified Medications   No medications on file    Planned Follow Up No follow-ups on file.   Total Time in Face-to-Face and in Coordination of Care for patient including independent/personal interpretation/review of prior  testing, medical history, examination, medication adjustment, communicating results with the patient directly, and documentation within the EHR is 35 minutes.   Justice Britain, MD [facility] Advanced [facility]oscopy Office # [phone]

## 2021-10-01 NOTE — Patient Instructions (Signed)
If you are age 74 or older, your body mass index should be between 23-30. Your Body mass index is 25 kg/m. If this is out of the aforementioned range listed, please consider follow up with your Primary Care Provider. ________________________________________________________  The [facility] providers would like to encourage you to use [facility] to communicate with providers for non-urgent requests or questions.  Due to long hold times on the telephone, sending your provider a message by [facility] may be a faster and more efficient way to get a response.  Please allow 48 business hours for a response.  Please remember that this is for non-urgent requests.  _______________________________________________________  It has been recommended to you by your physician that you have a colonoscopy completed. Per your request, we did not schedule the procedure(s) today. We do not have our schedule open for November.  We will contact you once our schedule is opened.  You will be scheduled for a pre-visit and procedure at that time.  We have sent the following medications to your pharmacy for you to pick up at your convenience:  Zofran '4mg'$  take one tablet every 8 hours as needed.  You will use these tablets before each prep.  Thank you for entrusting me with your care and choosing [facility].  Dr Rush Landmark

## 2021-10-02 ENCOUNTER — Telehealth: Payer: Self-pay

## 2021-10-02 ENCOUNTER — Encounter: Payer: Self-pay | Admitting: Gastroenterology

## 2021-10-02 DIAGNOSIS — Z7902 Long term (current) use of antithrombotics/antiplatelets: Secondary | ICD-10-CM | POA: Insufficient documentation

## 2021-10-02 MED ORDER — NA SULFATE-K SULFATE-MG SULF 17.5-3.13-1.6 GM/177ML PO SOLN: 1.0000 | ORAL | 0 refills | Status: DC

## 2021-10-02 NOTE — Telephone Encounter (Signed)
Patient has an in office appointment scheduled to see Ermalinda Barrios, PA on 10/07/2021 at 12:45 PM. I have added pre-op clearance to the appointment notes.

## 2021-10-02 NOTE — Addendum Note (Signed)
Addended by: Stevan Born on: 10/02/2021 10:35 AM   Modules accepted: Orders

## 2021-10-02 NOTE — Telephone Encounter (Signed)
[facility] Pre-operative Risk Assessment     Request for surgical clearance:     Endoscopy Procedure  What type of surgery is being performed?     Colonoscopy  When is this surgery scheduled?     11-11-21  What type of clearance is required ?   Pharmacy  Are there any medications that need to be held prior to surgery and how long? Plavix x 5 days  Practice name and name of physician performing surgery?      [facility]  What is your office phone and fax number?      Phone- [phone]  [phone]  Anesthesia type (None, local, MAC, general) ?       MAC

## 2021-10-02 NOTE — Telephone Encounter (Signed)
   Name: TYKIRA WACHS  DOB: [birth year]  MRN: [id number]  Primary Cardiologist: Werner Lean, MD  Chart reviewed as part of pre-operative protocol coverage. Because of Rose Hegner Copher's past medical history and time since last visit, she will require a follow-up telephone visit in order to better assess preoperative cardiovascular risk.  Pre-op covering staff: - Please schedule appointment and call patient to inform them. If patient already had an upcoming appointment within acceptable timeframe, please add "pre-op clearance" to the appointment notes so provider is aware. - Please contact requesting surgeon's office via preferred method (i.e, phone, fax) to inform them of need for appointment prior to surgery.  PCP prescribes her plavix so clearance will ned to come from them.    Elgie Collard, PA-C  10/02/2021, 1:51 PM

## 2021-10-05 ENCOUNTER — Ambulatory Visit: Payer: Self-pay | Admitting: Licensed Clinical Social Worker

## 2021-10-05 DIAGNOSIS — I129 Hypertensive chronic kidney disease with stage 1 through stage 4 chronic kidney disease, or unspecified chronic kidney disease: Secondary | ICD-10-CM | POA: Diagnosis not present

## 2021-10-07 ENCOUNTER — Ambulatory Visit: Payer: Medicare Other | Attending: Physician Assistant | Admitting: Physician Assistant

## 2021-10-07 ENCOUNTER — Encounter: Payer: Self-pay | Admitting: Physician Assistant

## 2021-10-07 VITALS — BP 132/82 | HR 78 | Ht 60.0 in | Wt 124.0 lb

## 2021-10-07 DIAGNOSIS — Z01818 Encounter for other preprocedural examination: Secondary | ICD-10-CM

## 2021-10-07 DIAGNOSIS — Z8673 Personal history of transient ischemic attack (TIA), and cerebral infarction without residual deficits: Secondary | ICD-10-CM

## 2021-10-07 DIAGNOSIS — I25118 Atherosclerotic heart disease of native coronary artery with other forms of angina pectoris: Secondary | ICD-10-CM | POA: Diagnosis not present

## 2021-10-07 DIAGNOSIS — I10 Essential (primary) hypertension: Secondary | ICD-10-CM

## 2021-10-07 DIAGNOSIS — Z794 Long term (current) use of insulin: Secondary | ICD-10-CM

## 2021-10-07 DIAGNOSIS — E1142 Type 2 diabetes mellitus with diabetic polyneuropathy: Secondary | ICD-10-CM | POA: Diagnosis not present

## 2021-10-07 DIAGNOSIS — R600 Localized edema: Secondary | ICD-10-CM | POA: Diagnosis not present

## 2021-10-07 MED ORDER — AMLODIPINE BESYLATE 5 MG PO TABS: 5.0000 mg | ORAL_TABLET | Freq: Every day | ORAL | 3 refills | Status: DC

## 2021-10-07 MED ORDER — HYDRALAZINE HCL 10 MG PO TABS: 10.0000 mg | ORAL_TABLET | Freq: Three times a day (TID) | ORAL | 3 refills | Status: DC

## 2021-10-07 NOTE — Patient Instructions (Signed)
Medication Instructions:  Your physician has recommended you make the following change in your medication:   Decrease Amlodipine to '5mg'$  2 times per day.  Start taking Hydralazine '10mg'$  3 times per day   *If you need a refill on your cardiac medications before your next appointment, please call your pharmacy*   Follow-Up: At [facility], you and your health needs are our priority.  As part of our continuing mission to provide you with exceptional heart care, we have created designated Provider Care Teams.  These Care Teams include your primary Cardiologist (physician) and Advanced Practice Providers (APPs -  Physician Assistants and Nurse Practitioners) who all work together to provide you with the care you need, when you need it.  We recommend signing up for the patient portal called "MyChart".  Sign up information is provided on this After Visit Summary.  MyChart is used to connect with patients for Virtual Visits (Telemedicine).  Patients are able to view lab/test results, encounter notes, upcoming appointments, etc.  Non-urgent messages can be sent to your provider as well.   To learn more about what you can do with MyChart, go to [email or web address].    Your next appointment:   2 week(s)  The format for your next appointment:   In Person  Provider:   Ermalinda Barrios, PA-C      }   Neurologist Dr. Leonie Man  Other Instructions Two Gram Sodium Diet 2000 mg  What is Sodium? Sodium is a mineral found naturally in many foods. The most significant source of sodium in the diet is table salt, which is about 40% sodium.  Processed, convenience, and preserved foods also contain a large amount of sodium.  The body needs only 500 mg of sodium daily to function,  A normal diet provides more than enough sodium even if you do not use salt.  Why Limit Sodium? A build up of sodium in the body can cause thirst, increased blood pressure, shortness of breath, and water retention.  Decreasing  sodium in the diet can reduce edema and risk of heart attack or stroke associated with high blood pressure.  Keep in mind that there are many other factors involved in these health problems.  Heredity, obesity, lack of exercise, cigarette smoking, stress and what you eat all play a role.  General Guidelines: Do not add salt at the table or in cooking.  One teaspoon of salt contains over 2 grams of sodium. Read food labels Avoid processed and convenience foods Ask your dietitian before eating any foods not dicussed in the menu planning guidelines Consult your physician if you wish to use a salt substitute or a sodium containing medication such as antacids.  Limit milk and milk products to 16 oz (2 cups) per day.  Shopping Hints: READ LABELS!! "Dietetic" does not necessarily mean low sodium. Salt and other sodium ingredients are often added to foods during processing.    Menu Planning Guidelines Food Group Choose More Often Avoid  Beverages (see also the milk group All fruit juices, low-sodium, salt-free vegetables juices, low-sodium carbonated beverages Regular vegetable or tomato juices, commercially softened water used for drinking or cooking  Breads and Cereals Enriched white, wheat, rye and pumpernickel bread, hard rolls and dinner rolls; muffins, cornbread and waffles; most dry cereals, cooked cereal without added salt; unsalted crackers and breadsticks; low sodium or homemade bread crumbs Bread, rolls and crackers with salted tops; quick breads; instant hot cereals; pancakes; commercial bread stuffing; self-rising flower and biscuit mixes; regular  bread crumbs or cracker crumbs  Desserts and Sweets Desserts and sweets mad with mild should be within allowance Instant pudding mixes and cake mixes  Fats Butter or margarine; vegetable oils; unsalted salad dressings, regular salad dressings limited to 1 Tbs; light, sour and heavy cream Regular salad dressings containing bacon fat, bacon bits, and  salt pork; snack dips made with instant soup mixes or processed cheese; salted nuts  Fruits Most fresh, frozen and canned fruits Fruits processed with salt or sodium-containing ingredient (some dried fruits are processed with sodium sulfites        Vegetables Fresh, frozen vegetables and low- sodium canned vegetables Regular canned vegetables, sauerkraut, pickled vegetables, and others prepared in brine; frozen vegetables in sauces; vegetables seasoned with ham, bacon or salt pork  Condiments, Sauces, Miscellaneous  Salt substitute with physician's approval; pepper, herbs, spices; vinegar, lemon or lime juice; hot pepper sauce; garlic powder, onion powder, low sodium soy sauce (1 Tbs.); low sodium condiments (ketchup, chili sauce, mustard) in limited amounts (1 tsp.) fresh ground horseradish; unsalted tortilla chips, pretzels, potato chips, popcorn, salsa (1/4 cup) Any seasoning made with salt including garlic salt, celery salt, onion salt, and seasoned salt; sea salt, rock salt, kosher salt; meat tenderizers; monosodium glutamate; mustard, regular soy sauce, barbecue, sauce, chili sauce, teriyaki sauce, steak sauce, Worcestershire sauce, and most flavored vinegars; canned gravy and mixes; regular condiments; salted snack foods, olives, picles, relish, horseradish sauce, catsup   Food preparation: Try these seasonings Meats:    Pork Sage, onion Serve with applesauce  Chicken Poultry seasoning, thyme, parsley Serve with cranberry sauce  Lamb Curry powder, rosemary, garlic, thyme Serve with mint sauce or jelly  Veal Marjoram, basil Serve with current jelly, cranberry sauce  Beef Pepper, bay leaf Serve with dry mustard, unsalted chive butter  Fish [place] leaf, dill Serve with unsalted lemon butter, unsalted parsley butter  Vegetables:    Asparagus Lemon juice   Broccoli Lemon juice   Carrots Mustard dressing parsley, mint, nutmeg, glazed with unsalted butter and sugar   Green beans Marjoram, lemon  juice, nutmeg,dill seed   Tomatoes Basil, marjoram, onion   Spice /blend for [employer]" 4 tsp ground thyme 1 tsp ground sage 3 tsp ground rosemary 4 tsp ground marjoram   Test your knowledge A product that says "Salt Free" may still contain sodium. True or False Garlic Powder and Hot Pepper Sauce an be used as alternative seasonings.True or False Processed foods have more sodium than fresh foods.  True or False Canned Vegetables have less sodium than froze True or False   WAYS TO DECREASE YOUR SODIUM INTAKE Avoid the use of added salt in cooking and at the table.  Table salt (and other prepared seasonings which contain salt) is probably one of the greatest sources of sodium in the diet.  Unsalted foods can gain flavor from the sweet, sour, and butter taste sensations of herbs and spices.  Instead of using salt for seasoning, try the following seasonings with the foods listed.  Remember: how you use them to enhance natural food flavors is limited only by your creativity... Allspice-Meat, fish, eggs, fruit, peas, red and yellow vegetables Almond Extract-Fruit baked goods Anise Seed-Sweet breads, fruit, carrots, beets, cottage cheese, cookies (tastes like licorice) Basil-Meat, fish, eggs, vegetables, rice, vegetables salads, soups, sauces Bay Leaf-Meat, fish, stews, poultry Burnet-Salad, vegetables (cucumber-like flavor) Caraway Seed-Bread, cookies, cottage cheese, meat, vegetables, cheese, rice Cardamon-Baked goods, fruit, soups Celery Powder or seed-Salads, salad dressings, sauces, meatloaf, soup, bread.Do  not use  celery salt Chervil-Meats, salads, fish, eggs, vegetables, cottage cheese (parsley-like flavor) Chili Power-Meatloaf, chicken cheese, corn, eggplant, egg dishes Chives-Salads cottage cheese, egg dishes, soups, vegetables, sauces Cilantro-Salsa, casseroles Cinnamon-Baked goods, fruit, pork, lamb, chicken, carrots Cloves-Fruit, baked goods, fish, pot roast, green beans, beets,  carrots Coriander-Pastry, cookies, meat, salads, cheese (lemon-orange flavor) Cumin-Meatloaf, fish,cheese, eggs, cabbage,fruit pie (caraway flavor) [employer], fruit, eggs, fish, poultry, cottage cheese, vegetables Dill Seed-Meat, cottage cheese, poultry, vegetables, fish, salads, bread Fennel Seed-Bread, cookies, apples, pork, eggs, fish, beets, cabbage, cheese, Licorice-like flavor Garlic-(buds or powder) Salads, meat, poultry, fish, bread, butter, vegetables, potatoes.Do not  use garlic salt Ginger-Fruit, vegetables, baked goods, meat, fish, poultry Horseradish Root-Meet, vegetables, butter Lemon Juice or Extract-Vegetables, fruit, tea, baked goods, fish salads Mace-Baked goods fruit, vegetables, fish, poultry (taste like nutmeg) Maple Extract-Syrups Marjoram-Meat, chicken, fish, vegetables, breads, green salads (taste like Sage) Mint-Tea, lamb, sherbet, vegetables, desserts, carrots, cabbage Mustard, Dry or Seed-Cheese, eggs, meats, vegetables, poultry Nutmeg-Baked goods, fruit, chicken, eggs, vegetables, desserts Onion Powder-Meat, fish, poultry, vegetables, cheese, eggs, bread, rice salads (Do not use   Onion salt) Orange Extract-Desserts, baked goods Oregano-Pasta, eggs, cheese, onions, pork, lamb, fish, chicken, vegetables, green salads Paprika-Meat, fish, poultry, eggs, cheese, vegetables Parsley Flakes-Butter, vegetables, meat fish, poultry, eggs, bread, salads (certain forms may   Contain sodium Pepper-Meat fish, poultry, vegetables, eggs Peppermint Extract-Desserts, baked goods Poppy Seed-Eggs, bread, cheese, fruit dressings, baked goods, noodles, vegetables, cottage  [employer], poultry, meat, fish, cauliflower, turnips,eggs bread Saffron-Rice, bread, veal, chicken, fish, eggs Sage-Meat, fish, poultry, onions, eggplant, tomateos, pork, stews Savory-Eggs, salads, poultry, meat, rice, vegetables, soups, pork Tarragon-Meat,  poultry, fish, eggs, butter, vegetables (licorice-like flavor)  Thyme-Meat, poultry, fish, eggs, vegetables, (clover-like flavor), sauces, soups Tumeric-Salads, butter, eggs, fish, rice, vegetables (saffron-like flavor) [name] Extract-Baked goods, candy Vinegar-Salads, vegetables, meat marinades Walnut Extract-baked goods, candy   2. Choose your Foods Wisely   The following is a list of foods to avoid which are high in sodium:  Meats-Avoid all smoked, canned, salt cured, dried and kosher meat and fish as well as Anchovies   [employer] meats:Bologna, Liverwurst, Pastrami Canned meat or fish  Marinated herring Caviar    Pepperoni Corned Beef   Pizza Dried chipped beef  Salami Frozen breaded fish or meat Salt pork Frankfurters or hot dogs  Sardines Gefilte fish   Sausage Ham (boiled ham, Proscuitto Smoked butt    spiced ham)   Spam      TV Dinners Vegetables Canned vegetables (Regular) Relish Canned mushrooms  Sauerkraut Olives    Tomato juice Pickles  Bakery and Dessert Products Canned puddings  Cream pies Cheesecake   Decorated cakes Cookies  Beverages/Juices Tomato juice, regular  Gatorade   V-8 vegetable juice, regular  Breads and Cereals Biscuit mixes   Salted potato chips, corn chips, pretzels Bread stuffing mixes  Salted crackers and rolls Pancake and waffle mixes Self-rising flour  Seasonings Accent    Meat sauces Barbecue sauce  Meat tenderizer Catsup    Monosodium glutamate (MSG) Celery salt   Onion salt Chili sauce   Prepared mustard Garlic salt   Salt, seasoned salt, sea salt Gravy mixes   Soy sauce Horseradish   Steak sauce Ketchup   Tartar sauce Lite salt    Teriyaki sauce Marinade mixes   Worcestershire sauce  Others Baking powder   Cocoa and cocoa mixes Baking soda   Commercial casserole mixes Candy-caramels, chocolate  Dehydrated soups  Bars, fudge,nougats  Instant rice and pasta mixes Canned broth or soup  Maraschino  cherries Cheese, aged and processed cheese and cheese spreads  Learning Assessment Quiz  Indicated T (for True) or F (for False) for each of the following statements:  _____ Fresh fruits and vegetables and unprocessed grains are generally low in sodium _____ Water may contain a considerable amount of sodium, depending on the source _____ You can always tell if a food is high in sodium by tasting it _____ Certain laxatives my be high in sodium and should be avoided unless prescribed   by a physician or pharmacist _____ Salt substitutes may be used freely by anyone on a sodium restricted diet _____ Sodium is present in table salt, food additives and as a natural component of   most foods _____ Table salt is approximately 90% sodium _____ Limiting sodium intake may help prevent excess fluid accumulation in the body _____ On a sodium-restricted diet, seasonings such as bouillon soy sauce, and    cooking wine should be used in place of table salt _____ On an ingredient list, a product which lists monosodium glutamate as the first   ingredient is an appropriate food to include on a low sodium diet  Circle the best answer(s) to the following statements (Hint: there may be more than one correct answer)  11. On a low-sodium diet, some acceptable snack items are:    A. Olives  F. Bean dip   K. Grapefruit juice    B. Salted Pretzels G. Commercial Popcorn   L. Canned peaches    C. Carrot Sticks  H. Bouillon   M. Unsalted nuts   D. [place] fries  I. Peanut butter crackers N. Salami   E. Sweet pickles J. Tomato Juice   O. Pizza  12.  Seasonings that may be used freely on a reduced - sodium diet include   A. Lemon wedges F.Monosodium glutamate K. Celery seed    B.Soysauce   G. Pepper   L. Mustard powder   C. Sea salt  H. Cooking wine  M. Onion flakes   D. Vinegar  E. Prepared horseradish N. Salsa   E. Sage   J. Worcestershire sauce  O. Chutney

## 2021-10-07 NOTE — Telephone Encounter (Signed)
Left message for patient to return call to further discuss Plavix hold.  Will continue efforts.

## 2021-10-08 NOTE — Telephone Encounter (Signed)
Patient advised that she has been given clearance to hold Plavix 5 days prior to colonoscopy scheduled for 11-11-21.  Patient advised to take last dose of Plavix on 11-05-21, and she will be advised when to restart Plavix by Dr Rush Landmark after the procedure.  Patient agreed to plan and verbalized understanding.  No further questions.

## 2021-10-12 ENCOUNTER — Ambulatory Visit (HOSPITAL_BASED_OUTPATIENT_CLINIC_OR_DEPARTMENT_OTHER): Payer: Medicare Other | Admitting: Physical Therapy

## 2021-10-12 NOTE — Patient Instructions (Signed)
Visit Information  Thank you for taking time to visit with me today. Please don't hesitate to contact me if I can be of assistance to you.   Following are the goals we discussed today:   Goals Addressed             This Visit's Progress    Obtain Safe and Stable Transportation   On track    Care Coordination Interventions: Solution-Focused Strategies employed:  Active listening / Reflection utilized  Emotional Support Provided LCSW contacted pt's insurance provider inquiring about transportation services. LCSW was informed pt has emergency transportation only LCSW completed [employer] [place] application         Our next appointment is by telephone on 11/02/21 at 10 AM  Please call the care guide team at [phone] if you need to cancel or reschedule your appointment.   If you are experiencing a Mental Health or [place] or need someone to talk to, please call the Suicide and Crisis Lifeline: 988 call 911   Patient verbalizes understanding of instructions and care plan provided today and agrees to view in [place]. Active MyChart status and patient understanding of how to access instructions and care plan via MyChart confirmed with patient.     Christa See, MSW, [email or web address] Phone [phone] 6:16 AM

## 2021-10-12 NOTE — Patient Outreach (Signed)
  Care Coordination   Follow Up Visit Note   10/12/2021 Name: KRISINDA GIOVANNI MRN: [id number] DOB: May 07, 1945  ALDENA WORM is a 76 y.o. year old female who sees [name], Malka So, MD for primary care. I spoke with  [name] by phone today.  What matters to the patients health and wellness today?  Transportation    Goals Addressed             This Visit's Progress    Obtain Safe and Stable Transportation   On track    Care Coordination Interventions: Solution-Focused Strategies employed:  Active listening / Reflection utilized  Emotional Support Provided LCSW contacted pt's insurance provider inquiring about transportation services. LCSW was informed pt has emergency transportation only LCSW completed [employer] [place] application         SDOH assessments and interventions completed:  No     Care Coordination Interventions Activated:  Yes  Care Coordination Interventions:  Yes, provided   Follow up plan: Follow up call scheduled for 4-6 weeks    Encounter Outcome:  Pt. Visit Completed   Christa See, MSW, [email or web address] Phone [phone] 6:12 AM

## 2021-10-13 NOTE — Progress Notes (Deleted)
[facility]  PATIENT: Sarah [name] DOB: 08-Sep-1945    REASON FOR VISIT: Follow-up for stroke right MCA 10/2016 readmitted 02/08/2018 for right parieto-occipital stroke  HISTORY FROM: Patient  Chief complaint: No chief complaint on file.     HPI:   Update 10/14/2021 JM: Patient returns per request due to continued gait and balance difficulties since her stroke in 2018.  She was referred to PT after prior visit and recently referred to PT again by PCP for low back pain and continued unstable gait.   Stable from stroke standpoint, denies new stroke/TIA symptoms.  Compliant on all prescribed medications.  Routinely follows with PCP and cardiology.     History provided for reference purposes only Update 09/11/2020 JM:, returns for requested visit due to c/o worsening balance over the past 2 months and gradual memory decline. Worsening knee pain after a near fall in June - seen by ortho for left knee arthritis and bursitis. Limiting ambulation due to pain.  She is able to ambulate without assistive device.  Mild decline of memory reported but able to maintain ADLs and IADLs independently.  Sedentary lifestyle without any type of memory or cognitive exercises.  Denies any specific weakness, numbness/tingling, speech difficulty or confusion.  Currently on aspirin, plavix, zetia and atorvastatin for secondary stroke prevention and hx of CAD. Blood pressure today 143/72.  Glucose levels routinely monitored which has been stable.  No further concerns at this time.  Update 06/21/2019:, Sarah [name] is being seen for stroke follow-up unaccompanied.  She has been stable from a stroke standpoint without new or worsening stroke/TIA symptoms.  Prior complaints of worsening imbalance, left arm numbness/tingling and speech difficulty with MRI and MRA stable without acute abnormalities.  She reports ongoing improvement and continues to do HEP as recommended by therapy at home.   EMG/NCV completed for left arm numbness/tingling with mild bilateral carpal tunnel syndrome but otherwise unremarkable.  Reports left arm symptoms resolved as well as left-sided neck symptoms.  Continues on clopidogrel and atorvastatin for secondary stroke prevention.  Recently added Zetia by PCP due to recent lipid panel showing LDL 115.  Blood pressure today 146/72.  Recent A1c 12.3 with recent adjustments of insulin regimen and continuation of Metformin and Victoza by PCP.  Continues to follow routinely with PCP for HTN, HLD and DM management.  Loop recorder has not shown atrial fibrillation thus far.  No concerns at this time.  Update 02/13/2019: Sarah [name] is a 76 year old female who is being seen today for stroke follow-up.  She has been stable from a stroke standpoint with residual mild stable left homonymous hemianopsia, mild imbalance which she believes has been slightly worsening over the past couple of months along with possible worsening of prior aphasia from stroke in 2018.  She does admit to limited physical activity and does not have an established exercise regimen along with endorsing increased stress currently caring for her mother with increased illness and increased stress due to overall COVID-19 pandemic.  She has recently signed up for [employer] program which will be starting in March.  She also states for the past 2 weeks, she has been experiencing left arm numbness/tingling but will resolve quickly and symptoms intermittent.  She does endorse left-sided neck stiffness that radiates into her upper trapezius and increased pain when looking towards the left.  Denies any increased weakness.  Unable to determine if numbness/tingling symptoms occur in certain positions.  She has not previously experienced this sensation.  Denies  any other new or worsening neurological symptoms.  She did have bilateral cataract surgery in 02/7515 without complication. Continues on Plavix and atorvastatin for  secondary stroke prevention without side effects.  Blood pressure today 130/60.  Continues to follow with PCP for DM management with recent A1c 9.7.  Loop recorder is not shown atrial fibrillation thus far.  Monitoring/battery life will be completed 10/2019.  She has received her first dose of Covid vaccination and plans on receiving second dose next week.  Denies new or worsening stroke/TIA symptoms.  No further concerns at this time.  06/27/2018 VIRTUAL VISIT: Sarah [name] is being seen today for three-month follow-up visit regarding recent stroke.  Initially scheduled for office visit follow-up but due to COVID-19 safety precautions, visit transition to telemedicine via MyChart with patient's consent.  She has been stable from a stroke standpoint with residual left homonymous hemianopsia and balance difficulties.  She continues to participate in outpatient PT with ongoing improvement.  Continues on Plavix and atorvastatin for secondary stroke prevention without reported side effects. Blood pressure monitored at home.  Lab work obtained yesterday with A1c 10.3 (prior 13.5).  She continues to follow with PCP for ongoing management.  Loop recorder has not shown atrial fibrillation thus far.  No further concerns at this time.  Denies new or worsening stroke/TIA symptoms.   Hospital follow-up 03/23/2018 CM: Sarah [name], 76 year old female returns for follow-up with readmission for stroke 1/29/ 2020.  MRI of the brain acute cortical and subcortical RPO infarcts.  She initially presented with left hemianopsia.  She is now on Plavix and aspirin for 3 months and then Plavix alone.  She has not had further stroke or TIA symptoms.  She has minimal bruising and no bleeding.  Hemoglobin A1c 13.5 on hospital admission.  She claims she is trying to get that down to a more reasonable level.  She remains on Lipitor without myalgias.  Blood pressure in the office today 146/87.  She continues to go to [facility].  She  is to start rehab physical therapy and occupational therapy today.  Loop recorder so far no atrial fibrillation.  She is not driving due to her vision.  She returns for reevaluation  Hospital summary: right parieto-occipital infarcts in setting of progressive R MCA stenosis, infarcts secondary to large vessel disease source Resultant L hemianopia  CT head R parietal watershed infarct. Small vessel disease.  MRI  Acute cortical and subcortical R P-O infarcts. Old R CR and R frontal lobe infarcts. Small vessel disease.  MRA head  No LVO. Focal stenosis R M1, R M2/M3, L M1 MRA neck B VA origin atherosclerosis 30-50% 2D Echo  pending  Loop interrogation - last check 1/3 w/o AF - interrogation today without AF LDL 76 HgbA1c 13.5 Lovenox 40 mg sq daily for VTE prophylaxis clopidogrel 75 mg daily prior to admission, now on aspirin 325 mg daily and clopidogrel 75 mg daily. Continue DAPT x 3 months then plavix alone Therapy recommendations:  OP OT and PT Disposition:  Home Patient advised not to drive      REVIEW OF SYSTEMS: Full 14 system review of systems performed and notable only for those listed, all others are neg:  Visual impairment, imbalance  ALLERGIES: No Known Allergies  HOME MEDICATIONS:   PAST MEDICAL HISTORY: Past Medical History:  Diagnosis Date   Bilateral carpal tunnel syndrome 03/27/2019   Boils    Glaucoma    Heart murmur    HTN (hypertension)  Hx of adenomatous colonic polyps    Hyperlipidemia    Osteoporosis    Pneumonia    Stroke ([place])    Type II or unspecified type diabetes mellitus without mention of complication, uncontrolled     PAST SURGICAL HISTORY: Past Surgical History:  Procedure Laterality Date   ABDOMINAL HYSTERECTOMY     CARPAL TUNNEL RELEASE     right   COLONOSCOPY  12-10-10   per Dr. Deatra Ina, clear, repeat in 7 yrs    CORONARY STENT INTERVENTION N/A 03/11/2020   Procedure: CORONARY STENT INTERVENTION;  Surgeon: Jettie Booze,  MD;  Location: [facility];  Service: Cardiovascular;  Laterality: N/A;   INCISION AND DRAINAGE PERIRECTAL ABSCESS N/A 09/26/2015   Procedure: IRRIGATION AND DEBRIDEMENT PERIRECTAL ABSCESS;  Surgeon: Mickeal Skinner, MD;  Location: [place];  Service: General;  Laterality: N/A;   INTRAVASCULAR ULTRASOUND/IVUS N/A 03/11/2020   Procedure: Intravascular Ultrasound/IVUS;  Surgeon: Jettie Booze, MD;  Location: [facility];  Service: Cardiovascular;  Laterality: N/A;   KNEE ARTHROSCOPY     right   LEFT HEART CATH AND CORONARY ANGIOGRAPHY N/A 03/11/2020   Procedure: LEFT HEART CATH AND CORONARY ANGIOGRAPHY;  Surgeon: Jettie Booze, MD;  Location: [facility];  Service: Cardiovascular;  Laterality: N/A;   LOOP RECORDER INSERTION N/A 10/19/2016   Procedure: LOOP RECORDER INSERTION;  Surgeon: Thompson Grayer, MD;  Location: [facility];  Service: Cardiovascular;  Laterality: N/A;   ORIF ANKLE FRACTURE Right 03/24/2012   Procedure: OPEN REDUCTION INTERNAL FIXATION (ORIF) ANKLE FRACTURE;  Surgeon: Newt Minion, MD;  Location: [place];  Service: Orthopedics;  Laterality: Right;  Open Reduction Internal Fixation Right Bimalleolar ankle fracture   POLYPECTOMY     TEE WITHOUT CARDIOVERSION N/A 10/18/2016   Procedure: TRANSESOPHAGEAL ECHOCARDIOGRAM (TEE);  Surgeon: Fay Records, MD;  Location: [facility];  Service: Cardiovascular;  Laterality: N/A;   TONSILLECTOMY      FAMILY HISTORY: Family History  Problem Relation Age of Onset   Diabetes Mother    Hypertension Mother    Stroke Father    Stroke Maternal Uncle    Stroke Paternal Uncle    Colon cancer Neg Hx    Esophageal cancer Neg Hx    Rectal cancer Neg Hx    Stomach cancer Neg Hx    Inflammatory bowel disease Neg Hx    Liver disease Neg Hx    Pancreatic cancer Neg Hx     SOCIAL HISTORY: Social History   Socioeconomic History   Marital status: Single    Spouse name: Not on file   Number of children:  Not on file   Years of education: Not on file   Highest education level: Not on file  Occupational History   Not on file  Tobacco Use   Smoking status: Former    Types: Cigarettes    Quit date: 10/15/2016    Years since quitting: 4.9   Smokeless tobacco: Never   Tobacco comments:    smokes occ.   Vaping Use   Vaping Use: Never used  Substance and Sexual Activity   Alcohol use: Yes    Alcohol/week: 0.0 standard drinks of alcohol    Comment: occ   Drug use: No   Sexual activity: Not on file  Other Topics Concern   Not on file  Social History Narrative   Not on file   Social Determinants of Health   Financial Resource Strain: Low Risk  (04/29/2021)  Overall Financial Resource Strain (CARDIA)    Difficulty of Paying Living Expenses: Not hard at all  Food Insecurity: No Food Insecurity (04/29/2021)   Hunger Vital Sign    Worried About Running Out of Food in the Last Year: Never true    Ran Out of Food in the Last Year: Never true  Transportation Needs: Unmet Transportation Needs (10/07/2021)   PRAPARE - Hydrologist (Medical): Yes    Lack of Transportation (Non-Medical): No  Physical Activity: Insufficiently Active (04/29/2021)   Exercise Vital Sign    Days of Exercise per Week: 1 day    Minutes of Exercise per Session: 30 min  Stress: No Stress Concern Present (04/29/2021)   [place]    Feeling of Stress : Not at all  Social Connections: Socially Isolated (04/29/2021)   Social Connection and Isolation Panel [NHANES]    Frequency of Communication with Friends and Family: More than three times a week    Frequency of Social Gatherings with Friends and Family: More than three times a week    Attends Religious Services: Never    Marine scientist or Organizations: No    Attends Archivist Meetings: Never    Marital Status: Never married  Intimate Partner Violence: Not  At Risk (04/29/2021)   Humiliation, Afraid, Rape, and Kick questionnaire    Fear of Current or Ex-Partner: No    Emotionally Abused: No    Physically Abused: No    Sexually Abused: No     PHYSICAL EXAM  There were no vitals filed for this visit.  There is no height or weight on file to calculate BMI.  General: well developed, well nourished, pleasant elderly African-American female, seated, in no evident distress Head: head normocephalic and atraumatic.   Neck: supple with no carotid or supraclavicular bruits Cardiovascular: regular rate and rhythm, no murmurs Musculoskeletal: no deformity Skin:  no rash/petichiae Vascular:  Normal pulses all extremities   Neurologic Exam Mental Status: Awake and fully alert. Occasional stuttering of speech/speech hesitancy (chronic). Oriented to place and time. Recent memory subjectively impaired and remote memory intact. Attention span, concentration and fund of knowledge appropriate. Mood and affect appropriate.  Cranial Nerves: Pupils equal, briskly reactive to light. Extraocular movements full without nystagmus. Visual fields full to confrontation. Hearing intact. Facial sensation intact. Face, tongue, palate moves normally and symmetrically.  Motor: Normal bulk and tone. Normal strength in all tested extremity muscles. Sensory.: intact to touch , pinprick , position and vibratory sensation.  Coordination: Rapid alternating movements normal in all extremities except slightly decreased left hand dexterity (chronic from prior stroke). Finger-to-nose and heel-to-shin performed accurately bilaterally. Gait and Station: Arises from chair without difficulty. Stance is normal. Gait demonstrates normal stride length and balance without use of assistive device although mild favoring of left knee.  Difficulty performing tandem walk and heel toe.  Reflexes: 1+ and symmetric. Toes downgoing.      DIAGNOSTIC DATA (LABS, IMAGING, TESTING) - I reviewed  patient records, labs, notes, testing and imaging myself where available.  Lab Results  Component Value Date   WBC 8.6 06/15/2021   HGB 11.2 (L) 06/15/2021   HCT 34.0 (L) 06/15/2021   MCV 95.0 06/15/2021   PLT 329 06/15/2021      Component Value Date/Time   NA 142 09/16/2021 0000   K 3.5 09/16/2021 0000   CL 106 09/16/2021 0000   CO2 31 (A) 09/16/2021  0000   GLUCOSE 375 (H) 03/04/2020 1025   GLUCOSE 457 (H) 01/21/2020 1629   BUN 10 09/16/2021 0000   CREATININE 0.7 09/16/2021 0000   CREATININE 0.65 03/04/2020 1025   CREATININE 0.55 (L) 10/24/2019 1602   CALCIUM 9.7 09/16/2021 0000   PROT 6.2 09/22/2020 0958   ALBUMIN 2.7 (A) 09/16/2021 0000   ALBUMIN 3.5 (L) 09/22/2020 0958   AST 20 09/22/2020 0958   ALT 17 09/22/2020 0958   ALKPHOS 99 09/22/2020 0958   BILITOT <0.2 09/22/2020 0958   GFRNONAA 89 03/04/2020 1025   GFRNONAA 94 10/24/2019 1602   GFRAA 103 03/04/2020 1025   GFRAA 109 10/24/2019 1602   Lab Results  Component Value Date   CHOL 190 12/26/2020   HDL 78 12/26/2020   LDLCALC 88 12/26/2020   TRIG 143 12/26/2020   CHOLHDL 2.4 12/26/2020   Lab Results  Component Value Date   HGBA1C 11.7 (A) 08/31/2021        ASSESSMENT AND PLAN    Sarah [name] is a 76 y.o. female with history of hypertension, hyperlipidemia, prior right MCA stroke with no gross deficits 2018 but very minimal left hand fine motor deficits, diabetes presenting to the hospital 02/08/2018 with left homonymous hemianopsia, MRI cortical and subcortical RPO infarcts.  Loop recorder placed which has not shown atrial fibrillation thus far.     -c/o worsening balance and cognition - likely multifactorial in setting of recent knee injury and sedentary lifestyle.  Referral placed to PT for further balance and knee exercises.  Discussed importance of routine memory exercises and keeping active as well as managing stroke risk factors. No new deficits or concerns on todays exam.  Continue to  follow with Dr. Erlinda Hong orthopedics for left knee pain -continue plavix and lipitor and Zetia for stroke prevention -monitor loop recorder for atrial fibrillation -Continue to monitor blood pressure at home -Continue stay active and maintain a healthy diet - maintain blood pressure goal <130/80, diabetes with hemoglobin A1c goal below 7.0% and lipids with LDL cholesterol goal below 70 mg/dL.     Stable from stroke standpoint recommend follow-up as needed   I spent 22 minutes of face-to-face and non-face-to-face time with patient.  This included previsit chart review, lab review, study review, order entry, electronic health record documentation, patient education and discussion as documented above  CC:  [place] provider: Dr. Sethi [name], Malka So, MD    Frann Rider, [facility]  [facility] [address] [place], [place] [address]  Phone [phone] Fax [phone] Note: This document was prepared with digital dictation and possible smart phrase technology. Any transcriptional errors that result from this process are unintentional.

## 2021-10-14 ENCOUNTER — Ambulatory Visit: Payer: Medicare Other | Admitting: Adult Health

## 2021-10-14 ENCOUNTER — Encounter: Payer: Self-pay | Admitting: Adult Health

## 2021-10-15 ENCOUNTER — Ambulatory Visit (HOSPITAL_BASED_OUTPATIENT_CLINIC_OR_DEPARTMENT_OTHER): Payer: Medicare Other | Attending: Family Medicine | Admitting: Physical Therapy

## 2021-10-19 ENCOUNTER — Telehealth: Payer: Self-pay | Admitting: Family Medicine

## 2021-10-19 ENCOUNTER — Telehealth (INDEPENDENT_AMBULATORY_CARE_PROVIDER_SITE_OTHER): Payer: Self-pay | Admitting: Family Medicine

## 2021-10-19 ENCOUNTER — Encounter: Payer: Self-pay | Admitting: Family Medicine

## 2021-10-19 VITALS — Ht 60.0 in

## 2021-10-19 DIAGNOSIS — M2550 Pain in unspecified joint: Secondary | ICD-10-CM

## 2021-10-19 NOTE — Telephone Encounter (Signed)
Pt called to inform MD that she has been diagnosed with Arthritis and would like to ask if MD to please send her some Tramadol for the pain. Pt stated that her mother takes Tramadol, and she was in so much pain she decided to take one, and it really helped.  LOV:  09/29/21  [id number] [address], [place] Phone:  [phone]  Fax:  [phone]

## 2021-10-19 NOTE — Telephone Encounter (Signed)
Pt scheduled for a VV this afternoon.

## 2021-10-19 NOTE — Progress Notes (Signed)
Cancelled.  

## 2021-10-19 NOTE — Telephone Encounter (Signed)
Tramadol is a controlled substance, so she'll need some type of visit, whether in office or virtual.

## 2021-10-20 ENCOUNTER — Encounter: Payer: Self-pay | Admitting: Family Medicine

## 2021-10-20 ENCOUNTER — Ambulatory Visit (INDEPENDENT_AMBULATORY_CARE_PROVIDER_SITE_OTHER): Payer: Medicare Other | Admitting: Family Medicine

## 2021-10-20 VITALS — BP 136/80 | HR 86 | Resp 16 | Ht 60.0 in | Wt 123.1 lb

## 2021-10-20 DIAGNOSIS — Z23 Encounter for immunization: Secondary | ICD-10-CM | POA: Diagnosis not present

## 2021-10-20 DIAGNOSIS — M79604 Pain in right leg: Secondary | ICD-10-CM | POA: Diagnosis not present

## 2021-10-20 DIAGNOSIS — F419 Anxiety disorder, unspecified: Secondary | ICD-10-CM | POA: Diagnosis not present

## 2021-10-20 DIAGNOSIS — M545 Low back pain, unspecified: Secondary | ICD-10-CM

## 2021-10-20 MED ORDER — TRAMADOL HCL 50 MG PO TABS: 50.0000 mg | ORAL_TABLET | Freq: Every day | ORAL | 0 refills | Status: AC | PRN

## 2021-10-20 MED ORDER — GABAPENTIN 100 MG PO CAPS: 100.0000 mg | ORAL_CAPSULE | Freq: Every day | ORAL | 0 refills | Status: DC

## 2021-10-20 NOTE — Progress Notes (Unsigned)
Cardiology Office Note:    Date:  10/21/2021   ID:  Sarah [name], DOB [birth year], MRN [id number]  PCP:  [name], [name], [facility] Cardiologist:  Werner Lean, MD     Referring MD: [name], [name], MD   Chief Complaint:  Follow-up     History of Present Illness:   Sarah [name] is a 76 y.o. female with   a hx of DM with HTN, Prior Stroke in 2018, Aortic atherosclerosis had echocardiogram that was relatively benign and a positive CCTA for RCA disease, had LHC 04/2020 and DES RCA residual 50% Cfx   patient had normal vascular duplex.      Patient saw Dr. Gasper Sells 03/2021 with dizziness and new heart murmur. Echo with Normal LVEF G1DD, sm pericardial effusion, mild MR, aortic valve sclerosis. Carotids stable 1-39% bilateral. F/u in 3 yrs.   I saw the patient 10/07/21  for preop clearance for colonoscopy 11/11/21 colonoscopy  by Dr. Rush Landmark and needs to hold plavix for 5 days. Dr. Gasper Sells ok'ed plavix hold for renal biopsy in June. Patient still complains of being very unsteady since her CVA 2018. No chest pain, sometimes has to take a deep breath going upstairs. Has some ankle swelling. Drinks a lot of soda. On low dose lasix and has compression stockings. Amlodipine increased 10 mg 6 months ago when swelling started.  I decreased amlodipine 5 mg daily and added hydralazine 10 mg tid for BP. I cleared her for colonoscopy.  Patient comes in for f/u. Still has leg swelling and kidney specialist increase lasix 40 mg daily. She's getting extra salt with frozen dinners and canned soups.  She isn't sure she's taking hydralazine. We called the pharmacy and she picked it up but doesn't think she's taking it. BP up today.    Past Medical History:  Diagnosis Date   Bilateral carpal tunnel syndrome 03/27/2019   Boils    Glaucoma    Heart murmur    HTN (hypertension)    Hx of adenomatous colonic polyps    Hyperlipidemia    Osteoporosis     Pneumonia    Stroke ([place])    Type II or unspecified type diabetes mellitus without mention of complication, uncontrolled    Current Medications: Current Meds  Medication Sig   amLODipine (NORVASC) 5 MG tablet Take 1 tablet (5 mg total) by mouth daily.   atorvastatin (LIPITOR) 80 MG tablet TAKE 1 TABLET(80 MG) BY MOUTH DAILY AT 6 PM   Blood Glucose Calibration (OT ULTRA/FASTTK CNTRL SOLN) SOLN    clopidogrel (PLAVIX) 75 MG tablet TAKE 1 TABLET(75 MG) BY MOUTH DAILY   Continuous Blood Gluc Sensor (DEXCOM G6 SENSOR) MISC 1 Device by Does not apply route as directed.   Continuous Blood Gluc Transmit (DEXCOM G6 TRANSMITTER) MISC 1 Device by Does not apply route as directed.   dapagliflozin propanediol (FARXIGA) 10 MG TABS tablet Take 1 tablet (10 mg total) by mouth daily.   ezetimibe (ZETIA) 10 MG tablet TAKE 1 TABLET(10 MG) BY MOUTH DAILY   furosemide (LASIX) 20 MG tablet TAKE 1 TABLET(20 MG) BY MOUTH DAILY (Patient taking differently: 40 mg.)   gabapentin (NEURONTIN) 100 MG capsule Take 1-2 capsules (100-200 mg total) by mouth at bedtime.   hydrALAZINE (APRESOLINE) 10 MG tablet Take 1 tablet (10 mg total) by mouth 3 (three) times daily.   insulin glargine, 1 Unit Dial, (TOUJEO SOLOSTAR) 300 UNIT/ML Solostar Pen Inject 80 Units into the skin daily  in the afternoon.   insulin lispro (HUMALOG KWIKPEN) 200 UNIT/ML KwikPen Max daily 50 units (Patient taking differently: Inject 14 Units into the skin 3 (three) times daily with meals.)   Insulin Pen Needle 32G X 4 MM MISC 1 Device by Does not apply route in the morning, at noon, in the evening, and at bedtime.   Lancet Devices (SIMPLE DIAGNOSTICS LANCING DEV) MISC Check BS before and 2 hours after meals.   latanoprost (XALATAN) 0.005 % ophthalmic solution Place 1 drop into both eyes at bedtime.   liraglutide (VICTOZA) 18 MG/3ML SOPN Inject 1.8 mg into the skin daily.   losartan (COZAAR) 100 MG tablet Take 100 mg by mouth daily.   losartan (COZAAR) 50  MG tablet TAKE 1 TABLET(50 MG) BY MOUTH DAILY   Magnesium 400 MG TABS Take 400 mg by mouth daily.   Na Sulfate-K Sulfate-Mg Sulf (SUPREP BOWEL PREP KIT) 17.5-3.13-1.6 GM/177ML SOLN Take 1 kit by mouth as directed.   nitroGLYCERIN (NITROSTAT) 0.4 MG SL tablet Place 1 tablet (0.4 mg total) under the tongue every 5 (five) minutes as needed for chest pain. one tab every 5 minutes up to 3 tablets total over 15 minutes.   ondansetron (ZOFRAN) 4 MG tablet Take 1 tablet (4 mg total) by mouth every 8 (eight) hours as needed for nausea or vomiting.   ONE TOUCH ULTRA TEST test strip Check BS before and 2 hours after meals.   sertraline (ZOLOFT) 50 MG tablet Take 1 tablet (50 mg total) by mouth daily.   traMADol (ULTRAM) 50 MG tablet Take 1 tablet (50 mg total) by mouth daily as needed.    Allergies:   Patient has no known allergies.   Social History   Tobacco Use   Smoking status: Former    Types: Cigarettes    Quit date: 10/15/2016    Years since quitting: 5.0   Smokeless tobacco: Never   Tobacco comments:    smokes occ.   Vaping Use   Vaping Use: Never used  Substance Use Topics   Alcohol use: Yes    Alcohol/week: 0.0 standard drinks of alcohol    Comment: occ   Drug use: No    Family Hx: The patient's family history includes Diabetes in her mother; Hypertension in her mother; Stroke in her father, maternal uncle, and paternal uncle. There is no history of Colon cancer, Esophageal cancer, Rectal cancer, Stomach cancer, Inflammatory bowel disease, Liver disease, or Pancreatic cancer.  ROS     Physical Exam:    VS:  BP 138/80   Pulse 79   Ht 5' (1.524 m)   Wt 121 lb (54.9 kg)   SpO2 96%   BMI 23.63 kg/m     Wt Readings from Last 3 Encounters:  10/21/21 121 lb (54.9 kg)  10/20/21 123 lb 2 oz (55.8 kg)  10/07/21 124 lb (56.2 kg)    Physical Exam  GEN: Well nourished, well developed, in no acute distress  Neck: no JVD, carotid bruits, or masses Cardiac:RRR; no murmurs, rubs, or  gallops  Respiratory:  clear to auscultation bilaterally, normal work of breathing GI: soft, nontender, nondistended, + BS Ext: trace edema without cyanosis, clubbing  Good distal pulses bilaterally Neuro:  Alert and Oriented x 3,  Psych: euthymic mood, full affect        EKGs/Labs/Other Test Reviewed:    EKG:  EKG is not ordered today.  Recent Labs: 06/15/2021: Hemoglobin 11.2; Platelets 329 09/16/2021: BUN 10; Creatinine 0.7; Potassium 3.5; Sodium  142   Recent Lipid Panel Recent Labs    12/26/20 0731  CHOL 190  TRIG 143  HDL 78  LDLCALC 88     Prior CV Studies:       Risk Assessment/Calculations/Metrics:              ASSESSMENT & PLAN:   No problem-specific Assessment & Plan notes found for this encounter.   LE edema-no improvement with lowering amlodipine to 5 mg daily, renal increased lasix 40 mg daily. BP up and getting extra salt in her diet. Doesn't know if she's taking the hydralazine I prescribed last OV. She will check at home, 2 gm sodium diet. Waiting for compression hose from insurance company.    CAD DES RCA 03/2020 residual 50% RCA-no angina on plavix   HTN-up initially but came down on recheck. Verify if she's taking hydralazine and adjust dose if needed. 2 gm sodium diet.   DM per PCP   History of CVA 2018-and still off balance. Was advised to get a cane and walker. Have her f/u with Dr. Leonie Man. Maybe she can benefit from more PT.            Dispo:  No follow-ups on file.   Medication Adjustments/Labs and Tests Ordered: Current medicines are reviewed at length with the patient today.  Concerns regarding medicines are outlined above.  Tests Ordered: No orders of the defined types were placed in this encounter.  Medication Changes: No orders of the defined types were placed in this encounter.  Signed, Ermalinda Barrios, PA-C  10/21/2021 2:08 PM    [address], [place], [place]  [address] Phone: [phone]; Fax: [phone]

## 2021-10-20 NOTE — Progress Notes (Signed)
Chief Complaint  Patient presents with   Medication Management    Would like to start using Tramadol for arthritis pain.   HPI: Sarah [name] is a 76 y.o. female with medical hx significant for poorly controlled DM II,CVA,HTN,and CKD III here today to discuss tramadol for her back pain. Sarah [name] is requesting prescription for Tramadol. Sarah [name] took one of her mother's Tramadol and really helped with pain. Sarah [name] tolerated well, no side effects reported.  Sarah [name] was last seen on 09/29/21 for bilateral back pain. Sarah [name] has had pain for about 6 weeks. Pain is worse of her right lower back and for the past 3-4 days it has been radiated to anterior and lateral aspect of RLE, down to ankle.  Pain is intermittently during the day, worse at night, interferes with sleep,5-7/10. Lumbar X ray on 10/01/21:Mild multilevel degenerative changes are noted. No acute abnormality seen.  Sarah [name] has not noted saddle anesthesia,numbness, tingling,new weakness,or changes in bladder/bowel function. + Right knee pain. No edema or erythema. No hx of trauma. Negative for fever,chills,abdominal pain,changes in bowel habits,or urinary symptoms. Lab Results  Component Value Date   CREATININE 0.7 09/16/2021   BUN 10 09/16/2021   NA 142 09/16/2021   K 3.5 09/16/2021   CL 106 09/16/2021   CO2 31 (A) 09/16/2021   Lab Results  Component Value Date   WBC 8.6 06/15/2021   HGB 11.2 (L) 06/15/2021   HCT 34.0 (L) 06/15/2021   MCV 95.0 06/15/2021   PLT 329 06/15/2021    Anxiety on Sertraline 50 mg daily, which Sarah [name] feels like it is helping. Review Flowsheet  More data exists      10/20/2021 09/29/2021 05/26/2021 04/29/2021 11/17/2020  Depression screen PHQ 2/9  Decreased Interest 1 1 0 0 0 0  Down, Depressed, Hopeless - 1 0 0 1 0  PHQ - 2 Score 1 2 0 0 1 0  Altered sleeping 0 1 0 0 0 -  Tired, decreased energy '1 1 1 ' 0 3 -  Change in appetite 0 0 0 0 0 -  Feeling bad or failure about yourself  0 0 0 0 0 -  Trouble  concentrating 1 0 0 0 2 -  Moving slowly or fidgety/restless 0 0 0 0 0 -  Suicidal thoughts 0 0 0 0 0 -  PHQ-9 Score '3 4 1 ' 0 6 -  Difficult doing work/chores Not difficult at all Not difficult at all Not difficult at all Not difficult at all Somewhat difficult -   Review of Systems  Constitutional:  Positive for fatigue. Negative for appetite change.  HENT:  Negative for mouth sores and nosebleeds.   Respiratory:  Negative for cough, shortness of breath and wheezing.   Cardiovascular:  Positive for leg swelling (Chronic). Negative for chest pain.  Gastrointestinal:  Negative for nausea and vomiting.  Genitourinary:  Negative for decreased urine volume, difficulty urinating, dysuria and hematuria.  Musculoskeletal:  Positive for arthralgias, back pain and gait problem.  Skin:  Negative for rash.  Psychiatric/Behavioral:  Negative for confusion. The patient is nervous/anxious.   Rest see pertinent positives and negatives per HPI.  Current Outpatient Medications on File Prior to Visit  Medication Sig Dispense Refill   amLODipine (NORVASC) 5 MG tablet Take 1 tablet (5 mg total) by mouth daily. 90 tablet 3   atorvastatin (LIPITOR) 80 MG tablet TAKE 1 TABLET(80 MG) BY MOUTH DAILY AT 6 PM 90 tablet 2   Blood Glucose Calibration (OT ULTRA/FASTTK CNTRL SOLN) SOLN  clopidogrel (PLAVIX) 75 MG tablet TAKE 1 TABLET(75 MG) BY MOUTH DAILY 90 tablet 1   Continuous Blood Gluc Sensor (DEXCOM G6 SENSOR) MISC 1 Device by Does not apply route as directed. 3 each 11   Continuous Blood Gluc Transmit (DEXCOM G6 TRANSMITTER) MISC 1 Device by Does not apply route as directed. 1 each 3   dapagliflozin propanediol (FARXIGA) 10 MG TABS tablet Take 1 tablet (10 mg total) by mouth daily. 90 tablet 3   ezetimibe (ZETIA) 10 MG tablet TAKE 1 TABLET(10 MG) BY MOUTH DAILY 90 tablet 3   furosemide (LASIX) 20 MG tablet TAKE 1 TABLET(20 MG) BY MOUTH DAILY (Patient taking differently: 40 mg.) 30 tablet 2   hydrALAZINE  (APRESOLINE) 10 MG tablet Take 1 tablet (10 mg total) by mouth 3 (three) times daily. 240 tablet 3   insulin glargine, 1 Unit Dial, (TOUJEO SOLOSTAR) 300 UNIT/ML Solostar Pen Inject 80 Units into the skin daily in the afternoon. 30 mL 3   insulin lispro (HUMALOG KWIKPEN) 200 UNIT/ML KwikPen Max daily 50 units (Patient taking differently: Inject 14 Units into the skin 3 (three) times daily with meals.) 30 mL 3   Insulin Pen Needle 32G X 4 MM MISC 1 Device by Does not apply route in the morning, at noon, in the evening, and at bedtime. 400 each 3   Lancet Devices (SIMPLE DIAGNOSTICS LANCING DEV) MISC Check BS before and 2 hours after meals. 200 each 6   latanoprost (XALATAN) 0.005 % ophthalmic solution Place 1 drop into both eyes at bedtime.     liraglutide (VICTOZA) 18 MG/3ML SOPN Inject 1.8 mg into the skin daily. 27 mL 3   losartan (COZAAR) 100 MG tablet Take 100 mg by mouth daily.     losartan (COZAAR) 50 MG tablet TAKE 1 TABLET(50 MG) BY MOUTH DAILY 90 tablet 1   Magnesium 400 MG TABS Take 400 mg by mouth daily.     Na Sulfate-K Sulfate-Mg Sulf (SUPREP BOWEL PREP KIT) 17.5-3.13-1.6 GM/177ML SOLN Take 1 kit by mouth as directed. 324 mL 0   nitroGLYCERIN (NITROSTAT) 0.4 MG SL tablet Place 1 tablet (0.4 mg total) under the tongue every 5 (five) minutes as needed for chest pain. one tab every 5 minutes up to 3 tablets total over 15 minutes. 25 tablet 3   ondansetron (ZOFRAN) 4 MG tablet Take 1 tablet (4 mg total) by mouth every 8 (eight) hours as needed for nausea or vomiting. 20 tablet 0   ONE TOUCH ULTRA TEST test strip Check BS before and 2 hours after meals. 200 each 6   sertraline (ZOLOFT) 50 MG tablet Take 1 tablet (50 mg total) by mouth daily. 90 tablet 1   metoprolol tartrate (LOPRESSOR) 25 MG tablet Take 1 tablet (25 mg total) by mouth 2 (two) times daily. (Patient taking differently: Take 25 mg by mouth daily.) 180 tablet 3   No current facility-administered medications on file prior to  visit.   Past Medical History:  Diagnosis Date   Bilateral carpal tunnel syndrome 03/27/2019   Boils    Glaucoma    Heart murmur    HTN (hypertension)    Hx of adenomatous colonic polyps    Hyperlipidemia    Osteoporosis    Pneumonia    Stroke ([place])    Type II or unspecified type diabetes mellitus without mention of complication, uncontrolled    No Known Allergies  Social History   Socioeconomic History   Marital status: Single    Spouse  name: Not on file   Number of children: Not on file   Years of education: Not on file   Highest education level: Not on file  Occupational History   Not on file  Tobacco Use   Smoking status: Former    Types: Cigarettes    Quit date: 10/15/2016    Years since quitting: 5.0   Smokeless tobacco: Never   Tobacco comments:    smokes occ.   Vaping Use   Vaping Use: Never used  Substance and Sexual Activity   Alcohol use: Yes    Alcohol/week: 0.0 standard drinks of alcohol    Comment: occ   Drug use: No   Sexual activity: Not on file  Other Topics Concern   Not on file  Social History Narrative   Not on file   Social Determinants of Health   Financial Resource Strain: Low Risk  (04/29/2021)   Overall Financial Resource Strain (CARDIA)    Difficulty of Paying Living Expenses: Not hard at all  Food Insecurity: No Food Insecurity (04/29/2021)   Hunger Vital Sign    Worried About Running Out of Food in the Last Year: Never true    Ran Out of Food in the Last Year: Never true  Transportation Needs: Unmet Transportation Needs (10/07/2021)   PRAPARE - Hydrologist (Medical): Yes    Lack of Transportation (Non-Medical): No  Physical Activity: Insufficiently Active (04/29/2021)   Exercise Vital Sign    Days of Exercise per Week: 1 day    Minutes of Exercise per Session: 30 min  Stress: No Stress Concern Present (04/29/2021)   [place]     Feeling of Stress : Not at all  Social Connections: Socially Isolated (04/29/2021)   Social Connection and Isolation Panel [NHANES]    Frequency of Communication with Friends and Family: More than three times a week    Frequency of Social Gatherings with Friends and Family: More than three times a week    Attends Religious Services: Never    Marine scientist or Organizations: No    Attends Archivist Meetings: Never    Marital Status: Never married   Vitals:   10/20/21 1529  BP: 136/80  Pulse: 86  Resp: 16  SpO2: 97%  Body mass index is 24.05 kg/m.  Physical Exam Vitals and nursing note reviewed.  Constitutional:      General: Sarah [name] is not in acute distress.    Appearance: Sarah [name] is well-developed. Sarah [name] is not ill-appearing.  HENT:     Head: Normocephalic and atraumatic.  Eyes:     Conjunctiva/sclera: Conjunctivae normal.  Cardiovascular:     Rate and Rhythm: Normal rate and regular rhythm.     Pulses:          Femoral pulses are 2+ on the right side.      Popliteal pulses are 2+ on the right side.     Heart sounds: Murmur (SEM I/VI LUSB) heard.     Comments: DP and PT palpable. Pulmonary:     Effort: Pulmonary effort is normal. No respiratory distress.     Breath sounds: Normal breath sounds.  Abdominal:     Palpations: Abdomen is soft. There is no mass.     Tenderness: There is no abdominal tenderness.  Musculoskeletal:     Lumbar back: Tenderness present. No bony tenderness. Negative right straight leg raise test (Elicits RLB pain) and negative left  straight leg raise test.       Back:     Right lower leg: 1+ Pitting Edema present.     Left lower leg: 1+ Pitting Edema present.     Comments: Full right hip flexion elicits right hip pain.  Lymphadenopathy:     Cervical: No cervical adenopathy.  Skin:    General: Skin is warm.     Findings: No erythema or rash.  Neurological:     Mental Status: Sarah [name] is alert and oriented to person, place, and time.      Comments: Antalgic, unstable gait assisted with a cane.  Psychiatric:        Mood and Affect: Mood and affect normal.   ASSESSMENT AND PLAN:  [name] was seen today for medication management.  Diagnoses and all orders for this visit:  Acute pain of right lower extremity We discussed possible etiologies, it is radiated from right lower back, radicular pain. Peripheral pulses are palpable. Because her DM II is poorly controlled, I am not recommending Prednisone taper. Last HgA1C 11.7 in 08/2021.  If pain has not resolved in 4-5 weeks , lumbar MRI will be considered. Instructed about warning signs.  -     gabapentin (NEURONTIN) 100 MG capsule; Take 1-2 capsules (100-200 mg total) by mouth at bedtime. -     traMADol (ULTRAM) 50 MG tablet; Take 1 tablet (50 mg total) by mouth daily as needed.  Anxiety disorder Problem is well controlled. Continue Sertraline 50 mg daily.  Low back pain This problem has been intermittent for years but worse for the past 6 weeks and now radiated to RLE. We discussed some side effects of Tramadol and the risk of interaction with some of her medications, specially Sertraline. Tramadol 50 mg daily as needed. Fall precautions.  Need for immunization against influenza -     Flu Vaccine QUAD High Dose(Fluad)  Return if symptoms worsen or fail to improve, for Keep next appt..  Shakeela Rabadan G. [name], MD  [facility]. [place] office.

## 2021-10-20 NOTE — Patient Instructions (Signed)
A few things to remember from today's visit:  Acute pain of right lower extremity - Plan: gabapentin (NEURONTIN) 100 MG capsule, traMADol (ULTRAM) 50 MG tablet  Bilateral low back pain, unspecified chronicity, unspecified whether sciatica present - Plan: traMADol (ULTRAM) 50 MG tablet  Leg pain could be caused by a pinched nerve. Because your blood sugars are high I prefer to hold on prednisone. Take Tramadol once daily as needed, hours after or before Sertraline. Gabapentin 100 mg to take 1-2 caps at bedtime.  If pain has not resolved in 4-5 weeks we may need a lumbar MRI.  Fall precautions.  If you need refills for medications you take chronically, please call your pharmacy. Do not use My Chart to request refills or for acute issues that need immediate attention. If you send a my chart message, it may take a few days to be addressed, specially if I am not in the office.  Please be sure medication list is accurate. If a new problem present, please set up appointment sooner than planned today.

## 2021-10-20 NOTE — Assessment & Plan Note (Signed)
This problem has been intermittent for years but worse for the past 6 weeks and now radiated to RLE. We discussed some side effects of Tramadol and the risk of interaction with some of her medications, specially Sertraline. Tramadol 50 mg daily as needed. Fall precautions.

## 2021-10-20 NOTE — Assessment & Plan Note (Signed)
Problem is well controlled. Continue Sertraline 50 mg daily.

## 2021-10-21 ENCOUNTER — Encounter: Payer: Self-pay | Admitting: Physician Assistant

## 2021-10-21 ENCOUNTER — Ambulatory Visit: Payer: Medicare Other | Attending: Physician Assistant | Admitting: Physician Assistant

## 2021-10-21 VITALS — BP 138/80 | HR 79 | Ht 60.0 in | Wt 121.0 lb

## 2021-10-21 DIAGNOSIS — R600 Localized edema: Secondary | ICD-10-CM | POA: Diagnosis not present

## 2021-10-21 DIAGNOSIS — E1142 Type 2 diabetes mellitus with diabetic polyneuropathy: Secondary | ICD-10-CM

## 2021-10-21 DIAGNOSIS — I2510 Atherosclerotic heart disease of native coronary artery without angina pectoris: Secondary | ICD-10-CM

## 2021-10-21 DIAGNOSIS — I10 Essential (primary) hypertension: Secondary | ICD-10-CM | POA: Diagnosis not present

## 2021-10-21 DIAGNOSIS — Z8673 Personal history of transient ischemic attack (TIA), and cerebral infarction without residual deficits: Secondary | ICD-10-CM | POA: Diagnosis not present

## 2021-10-21 DIAGNOSIS — Z794 Long term (current) use of insulin: Secondary | ICD-10-CM | POA: Diagnosis not present

## 2021-10-21 NOTE — Patient Instructions (Signed)
Medication Instructions:  Your physician recommends that you continue on your current medications as directed. Please refer to the Current Medication list given to you today.  *If you need a refill on your cardiac medications before your next appointment, please call your pharmacy*   Lab Work: None ordered If you have labs (blood work) drawn today and your tests are completely normal, you will receive your results only by: [place] (if you have MyChart) OR A paper copy in the mail If you have any lab test that is abnormal or we need to change your treatment, we will call you to review the results.   Follow-Up: At [facility], you and your health needs are our priority.  As part of our continuing mission to provide you with exceptional heart care, we have created designated Provider Care Teams.  These Care Teams include your primary Cardiologist (physician) and Advanced Practice Providers (APPs -  Physician Assistants and Nurse Practitioners) who all work together to provide you with the care you need, when you need it.   Your next appointment:   4-6 week(s)  The format for your next appointment:   In Person  Provider:   Werner Lean, MD  or Ermalinda Barrios, PA-C       Other Instructions Call when you get home and let us know what medications you are taking.  Two Gram Sodium Diet 2000 mg  What is Sodium? Sodium is a mineral found naturally in many foods. The most significant source of sodium in the diet is table salt, which is about 40% sodium.  Processed, convenience, and preserved foods also contain a large amount of sodium.  The body needs only 500 mg of sodium daily to function,  A normal diet provides more than enough sodium even if you do not use salt.  Why Limit Sodium? A build up of sodium in the body can cause thirst, increased blood pressure, shortness of breath, and water retention.  Decreasing sodium in the diet can reduce edema and risk of heart  attack or stroke associated with high blood pressure.  Keep in mind that there are many other factors involved in these health problems.  Heredity, obesity, lack of exercise, cigarette smoking, stress and what you eat all play a role.  General Guidelines: Do not add salt at the table or in cooking.  One teaspoon of salt contains over 2 grams of sodium. Read food labels Avoid processed and convenience foods Ask your dietitian before eating any foods not dicussed in the menu planning guidelines Consult your physician if you wish to use a salt substitute or a sodium containing medication such as antacids.  Limit milk and milk products to 16 oz (2 cups) per day.  Shopping Hints: READ LABELS!! "Dietetic" does not necessarily mean low sodium. Salt and other sodium ingredients are often added to foods during processing.    Menu Planning Guidelines Food Group Choose More Often Avoid  Beverages (see also the milk group All fruit juices, low-sodium, salt-free vegetables juices, low-sodium carbonated beverages Regular vegetable or tomato juices, commercially softened water used for drinking or cooking  Breads and Cereals Enriched white, wheat, rye and pumpernickel bread, hard rolls and dinner rolls; muffins, cornbread and waffles; most dry cereals, cooked cereal without added salt; unsalted crackers and breadsticks; low sodium or homemade bread crumbs Bread, rolls and crackers with salted tops; quick breads; instant hot cereals; pancakes; commercial bread stuffing; self-rising flower and biscuit mixes; regular bread crumbs or cracker  crumbs  Desserts and Sweets Desserts and sweets mad with mild should be within allowance Instant pudding mixes and cake mixes  Fats Butter or margarine; vegetable oils; unsalted salad dressings, regular salad dressings limited to 1 Tbs; light, sour and heavy cream Regular salad dressings containing bacon fat, bacon bits, and salt pork; snack dips made with instant soup mixes or  processed cheese; salted nuts  Fruits Most fresh, frozen and canned fruits Fruits processed with salt or sodium-containing ingredient (some dried fruits are processed with sodium sulfites        Vegetables Fresh, frozen vegetables and low- sodium canned vegetables Regular canned vegetables, sauerkraut, pickled vegetables, and others prepared in brine; frozen vegetables in sauces; vegetables seasoned with ham, bacon or salt pork  Condiments, Sauces, Miscellaneous  Salt substitute with physician's approval; pepper, herbs, spices; vinegar, lemon or lime juice; hot pepper sauce; garlic powder, onion powder, low sodium soy sauce (1 Tbs.); low sodium condiments (ketchup, chili sauce, mustard) in limited amounts (1 tsp.) fresh ground horseradish; unsalted tortilla chips, pretzels, potato chips, popcorn, salsa (1/4 cup) Any seasoning made with salt including garlic salt, celery salt, onion salt, and seasoned salt; sea salt, rock salt, kosher salt; meat tenderizers; monosodium glutamate; mustard, regular soy sauce, barbecue, sauce, chili sauce, teriyaki sauce, steak sauce, Worcestershire sauce, and most flavored vinegars; canned gravy and mixes; regular condiments; salted snack foods, olives, picles, relish, horseradish sauce, catsup   Food preparation: Try these seasonings Meats:    Pork Sage, onion Serve with applesauce  Chicken Poultry seasoning, thyme, parsley Serve with cranberry sauce  Lamb Curry powder, rosemary, garlic, thyme Serve with mint sauce or jelly  Veal Marjoram, basil Serve with current jelly, cranberry sauce  Beef Pepper, bay leaf Serve with dry mustard, unsalted chive butter  Fish [place] leaf, dill Serve with unsalted lemon butter, unsalted parsley butter  Vegetables:    Asparagus Lemon juice   Broccoli Lemon juice   Carrots Mustard dressing parsley, mint, nutmeg, glazed with unsalted butter and sugar   Green beans Marjoram, lemon juice, nutmeg,dill seed   Tomatoes Basil, marjoram,  onion   Spice /blend for [employer]" 4 tsp ground thyme 1 tsp ground sage 3 tsp ground rosemary 4 tsp ground marjoram   Test your knowledge A product that says "Salt Free" may still contain sodium. True or False Garlic Powder and Hot Pepper Sauce an be used as alternative seasonings.True or False Processed foods have more sodium than fresh foods.  True or False Canned Vegetables have less sodium than froze True or False   WAYS TO DECREASE YOUR SODIUM INTAKE Avoid the use of added salt in cooking and at the table.  Table salt (and other prepared seasonings which contain salt) is probably one of the greatest sources of sodium in the diet.  Unsalted foods can gain flavor from the sweet, sour, and butter taste sensations of herbs and spices.  Instead of using salt for seasoning, try the following seasonings with the foods listed.  Remember: how you use them to enhance natural food flavors is limited only by your creativity... Allspice-Meat, fish, eggs, fruit, peas, red and yellow vegetables Almond Extract-Fruit baked goods Anise Seed-Sweet breads, fruit, carrots, beets, cottage cheese, cookies (tastes like licorice) Basil-Meat, fish, eggs, vegetables, rice, vegetables salads, soups, sauces Bay Leaf-Meat, fish, stews, poultry Burnet-Salad, vegetables (cucumber-like flavor) Caraway Seed-Bread, cookies, cottage cheese, meat, vegetables, cheese, rice Cardamon-Baked goods, fruit, soups Celery Powder or seed-Salads, salad dressings, sauces, meatloaf, soup, bread.Do not use  celery  salt Chervil-Meats, salads, fish, eggs, vegetables, cottage cheese (parsley-like flavor) Chili Power-Meatloaf, chicken cheese, corn, eggplant, egg dishes Chives-Salads cottage cheese, egg dishes, soups, vegetables, sauces Cilantro-Salsa, casseroles Cinnamon-Baked goods, fruit, pork, lamb, chicken, carrots Cloves-Fruit, baked goods, fish, pot roast, green beans, beets, carrots Coriander-Pastry, cookies, meat, salads,  cheese (lemon-orange flavor) Cumin-Meatloaf, fish,cheese, eggs, cabbage,fruit pie (caraway flavor) [employer], fruit, eggs, fish, poultry, cottage cheese, vegetables Dill Seed-Meat, cottage cheese, poultry, vegetables, fish, salads, bread Fennel Seed-Bread, cookies, apples, pork, eggs, fish, beets, cabbage, cheese, Licorice-like flavor Garlic-(buds or powder) Salads, meat, poultry, fish, bread, butter, vegetables, potatoes.Do not  use garlic salt Ginger-Fruit, vegetables, baked goods, meat, fish, poultry Horseradish Root-Meet, vegetables, butter Lemon Juice or Extract-Vegetables, fruit, tea, baked goods, fish salads Mace-Baked goods fruit, vegetables, fish, poultry (taste like nutmeg) Maple Extract-Syrups Marjoram-Meat, chicken, fish, vegetables, breads, green salads (taste like Sage) Mint-Tea, lamb, sherbet, vegetables, desserts, carrots, cabbage Mustard, Dry or Seed-Cheese, eggs, meats, vegetables, poultry Nutmeg-Baked goods, fruit, chicken, eggs, vegetables, desserts Onion Powder-Meat, fish, poultry, vegetables, cheese, eggs, bread, rice salads (Do not use   Onion salt) Orange Extract-Desserts, baked goods Oregano-Pasta, eggs, cheese, onions, pork, lamb, fish, chicken, vegetables, green salads Paprika-Meat, fish, poultry, eggs, cheese, vegetables Parsley Flakes-Butter, vegetables, meat fish, poultry, eggs, bread, salads (certain forms may   Contain sodium Pepper-Meat fish, poultry, vegetables, eggs Peppermint Extract-Desserts, baked goods Poppy Seed-Eggs, bread, cheese, fruit dressings, baked goods, noodles, vegetables, cottage  [employer], poultry, meat, fish, cauliflower, turnips,eggs bread Saffron-Rice, bread, veal, chicken, fish, eggs Sage-Meat, fish, poultry, onions, eggplant, tomateos, pork, stews Savory-Eggs, salads, poultry, meat, rice, vegetables, soups, pork Tarragon-Meat, poultry, fish, eggs, butter, vegetables (licorice-like  flavor)  Thyme-Meat, poultry, fish, eggs, vegetables, (clover-like flavor), sauces, soups Tumeric-Salads, butter, eggs, fish, rice, vegetables (saffron-like flavor) [name] Extract-Baked goods, candy Vinegar-Salads, vegetables, meat marinades Walnut Extract-baked goods, candy   2. Choose your Foods Wisely   The following is a list of foods to avoid which are high in sodium:  Meats-Avoid all smoked, canned, salt cured, dried and kosher meat and fish as well as Anchovies   [employer] meats:Bologna, Liverwurst, Pastrami Canned meat or fish  Marinated herring Caviar    Pepperoni Corned Beef   Pizza Dried chipped beef  Salami Frozen breaded fish or meat Salt pork Frankfurters or hot dogs  Sardines Gefilte fish   Sausage Ham (boiled ham, Proscuitto Smoked butt    spiced ham)   Spam      TV Dinners Vegetables Canned vegetables (Regular) Relish Canned mushrooms  Sauerkraut Olives    Tomato juice Pickles  Bakery and Dessert Products Canned puddings  Cream pies Cheesecake   Decorated cakes Cookies  Beverages/Juices Tomato juice, regular  Gatorade   V-8 vegetable juice, regular  Breads and Cereals Biscuit mixes   Salted potato chips, corn chips, pretzels Bread stuffing mixes  Salted crackers and rolls Pancake and waffle mixes Self-rising flour  Seasonings Accent    Meat sauces Barbecue sauce  Meat tenderizer Catsup    Monosodium glutamate (MSG) Celery salt   Onion salt Chili sauce   Prepared mustard Garlic salt   Salt, seasoned salt, sea salt Gravy mixes   Soy sauce Horseradish   Steak sauce Ketchup   Tartar sauce Lite salt    Teriyaki sauce Marinade mixes   Worcestershire sauce  Others Baking powder   Cocoa and cocoa mixes Baking soda   Commercial casserole mixes Candy-caramels, chocolate  Dehydrated soups    Bars, fudge,nougats  Instant  rice and pasta mixes Canned broth or soup  Maraschino cherries Cheese, aged and processed cheese and cheese  spreads  Learning Assessment Quiz  Indicated T (for True) or F (for False) for each of the following statements:  _____ Fresh fruits and vegetables and unprocessed grains are generally low in sodium _____ Water may contain a considerable amount of sodium, depending on the source _____ You can always tell if a food is high in sodium by tasting it _____ Certain laxatives my be high in sodium and should be avoided unless prescribed   by a physician or pharmacist _____ Salt substitutes may be used freely by anyone on a sodium restricted diet _____ Sodium is present in table salt, food additives and as a natural component of   most foods _____ Table salt is approximately 90% sodium _____ Limiting sodium intake may help prevent excess fluid accumulation in the body _____ On a sodium-restricted diet, seasonings such as bouillon soy sauce, and    cooking wine should be used in place of table salt _____ On an ingredient list, a product which lists monosodium glutamate as the first   ingredient is an appropriate food to include on a low sodium diet  Circle the best answer(s) to the following statements (Hint: there may be more than one correct answer)  11. On a low-sodium diet, some acceptable snack items are:    A. Olives  F. Bean dip   K. Grapefruit juice    B. Salted Pretzels G. Commercial Popcorn   L. Canned peaches    C. Carrot Sticks  H. Bouillon   M. Unsalted nuts   D. [place] fries  I. Peanut butter crackers N. Salami   E. Sweet pickles J. Tomato Juice   O. Pizza  12.  Seasonings that may be used freely on a reduced - sodium diet include   A. Lemon wedges F.Monosodium glutamate K. Celery seed    B.Soysauce   G. Pepper   L. Mustard powder   C. Sea salt  H. Cooking wine  M. Onion flakes   D. Vinegar  E. Prepared horseradish N. Salsa   E. Sage   J. Worcestershire sauce  O. Chutney   Important Information About Sugar

## 2021-10-23 ENCOUNTER — Emergency Department (HOSPITAL_COMMUNITY): Payer: Medicare Other

## 2021-10-23 ENCOUNTER — Other Ambulatory Visit: Payer: Self-pay

## 2021-10-23 ENCOUNTER — Inpatient Hospital Stay (HOSPITAL_COMMUNITY)
Admission: EM | Admit: 2021-10-23 | Discharge: 2021-10-28 | DRG: 064 | Disposition: A | Discharge status: Home / Self Care | Payer: Medicare Other | Attending: Internal Medicine | Admitting: Internal Medicine

## 2021-10-23 ENCOUNTER — Encounter (HOSPITAL_COMMUNITY): Payer: Self-pay | Admitting: *Deleted

## 2021-10-23 DIAGNOSIS — H53462 Homonymous bilateral field defects, left side: Secondary | ICD-10-CM | POA: Diagnosis present

## 2021-10-23 DIAGNOSIS — N182 Chronic kidney disease, stage 2 (mild): Secondary | ICD-10-CM | POA: Diagnosis not present

## 2021-10-23 DIAGNOSIS — I6340 Cerebral infarction due to embolism of unspecified cerebral artery: Principal | ICD-10-CM

## 2021-10-23 DIAGNOSIS — Z794 Long term (current) use of insulin: Secondary | ICD-10-CM | POA: Diagnosis not present

## 2021-10-23 DIAGNOSIS — M810 Age-related osteoporosis without current pathological fracture: Secondary | ICD-10-CM | POA: Diagnosis present

## 2021-10-23 DIAGNOSIS — G9340 Encephalopathy, unspecified: Secondary | ICD-10-CM | POA: Diagnosis not present

## 2021-10-23 DIAGNOSIS — I214 Non-ST elevation (NSTEMI) myocardial infarction: Secondary | ICD-10-CM

## 2021-10-23 DIAGNOSIS — I639 Cerebral infarction, unspecified: Secondary | ICD-10-CM | POA: Diagnosis not present

## 2021-10-23 DIAGNOSIS — I2489 Other forms of acute ischemic heart disease: Secondary | ICD-10-CM | POA: Diagnosis present

## 2021-10-23 DIAGNOSIS — I2510 Atherosclerotic heart disease of native coronary artery without angina pectoris: Secondary | ICD-10-CM | POA: Diagnosis not present

## 2021-10-23 DIAGNOSIS — Z79899 Other long term (current) drug therapy: Secondary | ICD-10-CM | POA: Diagnosis not present

## 2021-10-23 DIAGNOSIS — R7989 Other specified abnormal findings of blood chemistry: Secondary | ICD-10-CM | POA: Diagnosis not present

## 2021-10-23 DIAGNOSIS — G8311 Monoplegia of lower limb affecting right dominant side: Secondary | ICD-10-CM | POA: Diagnosis not present

## 2021-10-23 DIAGNOSIS — Z91128 Patient's intentional underdosing of medication regimen for other reason: Secondary | ICD-10-CM

## 2021-10-23 DIAGNOSIS — E1165 Type 2 diabetes mellitus with hyperglycemia: Secondary | ICD-10-CM | POA: Diagnosis not present

## 2021-10-23 DIAGNOSIS — Z87891 Personal history of nicotine dependence: Secondary | ICD-10-CM

## 2021-10-23 DIAGNOSIS — G9341 Metabolic encephalopathy: Secondary | ICD-10-CM | POA: Diagnosis present

## 2021-10-23 DIAGNOSIS — Z743 Need for continuous supervision: Secondary | ICD-10-CM | POA: Diagnosis not present

## 2021-10-23 DIAGNOSIS — R739 Hyperglycemia, unspecified: Secondary | ICD-10-CM | POA: Diagnosis not present

## 2021-10-23 DIAGNOSIS — I69312 Visuospatial deficit and spatial neglect following cerebral infarction: Secondary | ICD-10-CM

## 2021-10-23 DIAGNOSIS — E1142 Type 2 diabetes mellitus with diabetic polyneuropathy: Secondary | ICD-10-CM | POA: Diagnosis not present

## 2021-10-23 DIAGNOSIS — D631 Anemia in chronic kidney disease: Secondary | ICD-10-CM | POA: Diagnosis present

## 2021-10-23 DIAGNOSIS — R4182 Altered mental status, unspecified: Secondary | ICD-10-CM | POA: Diagnosis present

## 2021-10-23 DIAGNOSIS — I6523 Occlusion and stenosis of bilateral carotid arteries: Secondary | ICD-10-CM | POA: Diagnosis not present

## 2021-10-23 DIAGNOSIS — E11649 Type 2 diabetes mellitus with hypoglycemia without coma: Secondary | ICD-10-CM | POA: Diagnosis not present

## 2021-10-23 DIAGNOSIS — Z823 Family history of stroke: Secondary | ICD-10-CM | POA: Diagnosis not present

## 2021-10-23 DIAGNOSIS — Z90710 Acquired absence of both cervix and uterus: Secondary | ICD-10-CM

## 2021-10-23 DIAGNOSIS — Z1152 Encounter for screening for COVID-19: Secondary | ICD-10-CM

## 2021-10-23 DIAGNOSIS — H409 Unspecified glaucoma: Secondary | ICD-10-CM | POA: Diagnosis present

## 2021-10-23 DIAGNOSIS — E876 Hypokalemia: Secondary | ICD-10-CM | POA: Diagnosis present

## 2021-10-23 DIAGNOSIS — I6389 Other cerebral infarction: Secondary | ICD-10-CM | POA: Diagnosis not present

## 2021-10-23 DIAGNOSIS — N179 Acute kidney failure, unspecified: Secondary | ICD-10-CM | POA: Diagnosis not present

## 2021-10-23 DIAGNOSIS — Z833 Family history of diabetes mellitus: Secondary | ICD-10-CM

## 2021-10-23 DIAGNOSIS — T466X6A Underdosing of antihyperlipidemic and antiarteriosclerotic drugs, initial encounter: Secondary | ICD-10-CM | POA: Diagnosis present

## 2021-10-23 DIAGNOSIS — Z7984 Long term (current) use of oral hypoglycemic drugs: Secondary | ICD-10-CM | POA: Diagnosis not present

## 2021-10-23 DIAGNOSIS — I10 Essential (primary) hypertension: Secondary | ICD-10-CM

## 2021-10-23 DIAGNOSIS — R29702 NIHSS score 2: Secondary | ICD-10-CM | POA: Diagnosis present

## 2021-10-23 DIAGNOSIS — Z7902 Long term (current) use of antithrombotics/antiplatelets: Secondary | ICD-10-CM

## 2021-10-23 DIAGNOSIS — R6889 Other general symptoms and signs: Secondary | ICD-10-CM | POA: Diagnosis not present

## 2021-10-23 DIAGNOSIS — R404 Transient alteration of awareness: Secondary | ICD-10-CM | POA: Diagnosis not present

## 2021-10-23 DIAGNOSIS — Z8262 Family history of osteoporosis: Secondary | ICD-10-CM

## 2021-10-23 DIAGNOSIS — R519 Headache, unspecified: Secondary | ICD-10-CM | POA: Diagnosis not present

## 2021-10-23 DIAGNOSIS — Z955 Presence of coronary angioplasty implant and graft: Secondary | ICD-10-CM

## 2021-10-23 DIAGNOSIS — R079 Chest pain, unspecified: Secondary | ICD-10-CM | POA: Diagnosis not present

## 2021-10-23 DIAGNOSIS — Z8249 Family history of ischemic heart disease and other diseases of the circulatory system: Secondary | ICD-10-CM

## 2021-10-23 DIAGNOSIS — E782 Mixed hyperlipidemia: Secondary | ICD-10-CM | POA: Diagnosis not present

## 2021-10-23 DIAGNOSIS — E1122 Type 2 diabetes mellitus with diabetic chronic kidney disease: Secondary | ICD-10-CM | POA: Diagnosis not present

## 2021-10-23 DIAGNOSIS — E785 Hyperlipidemia, unspecified: Secondary | ICD-10-CM | POA: Diagnosis not present

## 2021-10-23 DIAGNOSIS — I6603 Occlusion and stenosis of bilateral middle cerebral arteries: Secondary | ICD-10-CM | POA: Diagnosis not present

## 2021-10-23 DIAGNOSIS — Z8673 Personal history of transient ischemic attack (TIA), and cerebral infarction without residual deficits: Secondary | ICD-10-CM | POA: Diagnosis not present

## 2021-10-23 DIAGNOSIS — I129 Hypertensive chronic kidney disease with stage 1 through stage 4 chronic kidney disease, or unspecified chronic kidney disease: Secondary | ICD-10-CM | POA: Diagnosis not present

## 2021-10-23 DIAGNOSIS — Z86010 Personal history of colonic polyps: Secondary | ICD-10-CM

## 2021-10-23 DIAGNOSIS — S82309A Unspecified fracture of lower end of unspecified tibia, initial encounter for closed fracture: Secondary | ICD-10-CM | POA: Diagnosis not present

## 2021-10-23 DIAGNOSIS — R0902 Hypoxemia: Secondary | ICD-10-CM | POA: Diagnosis not present

## 2021-10-23 DIAGNOSIS — I63442 Cerebral infarction due to embolism of left cerebellar artery: Secondary | ICD-10-CM | POA: Diagnosis present

## 2021-10-23 LAB — URINALYSIS, ROUTINE W REFLEX MICROSCOPIC
Bilirubin Urine: NEGATIVE
Glucose, UA: >=500 mg/dL — AB
Ketones, ur: NEGATIVE mg/dL
Leukocytes,Ua: NEGATIVE
Nitrite: NEGATIVE
Protein, ur: 100 mg/dL — AB
Specific Gravity, Urine: 1.001 — ABNORMAL LOW (ref 1.005–1.030)
pH: 6 (ref 5.0–8.0)

## 2021-10-23 LAB — BASIC METABOLIC PANEL
Anion gap: 14 (ref 5–15)
BUN: 14 mg/dL (ref 8–23)
CO2: 25 mmol/L (ref 22–32)
Calcium: 8.5 mg/dL — ABNORMAL LOW (ref 8.9–10.3)
Chloride: 101 mmol/L (ref 98–111)
Creatinine, Ser: 1.15 mg/dL — ABNORMAL HIGH (ref 0.44–1.00)
GFR, Estimated: 50 mL/min — ABNORMAL LOW (ref >60)
Glucose, Bld: 315 mg/dL — ABNORMAL HIGH (ref 70–99)
Potassium: 3 mmol/L — ABNORMAL LOW (ref 3.5–5.1)
Sodium: 140 mmol/L (ref 135–145)

## 2021-10-23 LAB — CBC WITH DIFFERENTIAL/PLATELET
Abs Immature Granulocytes: 0.04 10*3/uL (ref 0.00–0.07)
Basophils Absolute: 0 10*3/uL (ref 0.0–0.1)
Basophils Relative: 0 %
Eosinophils Absolute: 0 10*3/uL (ref 0.0–0.5)
Eosinophils Relative: 0 %
HCT: 39.7 % (ref 36.0–46.0)
Hemoglobin: 13 g/dL (ref 12.0–15.0)
Immature Granulocytes: 0 %
Lymphocytes Relative: 21 %
Lymphs Abs: 2.4 10*3/uL (ref 0.7–4.0)
MCH: 30.7 pg (ref 26.0–34.0)
MCHC: 32.7 g/dL (ref 30.0–36.0)
MCV: 93.9 fL (ref 80.0–100.0)
Monocytes Absolute: 0.5 10*3/uL (ref 0.1–1.0)
Monocytes Relative: 5 %
Neutro Abs: 8.5 10*3/uL — ABNORMAL HIGH (ref 1.7–7.7)
Neutrophils Relative %: 74 %
Platelets: 415 10*3/uL — ABNORMAL HIGH (ref 150–400)
RBC: 4.23 MIL/uL (ref 3.87–5.11)
RDW: 13.2 % (ref 11.5–15.5)
WBC: 11.5 10*3/uL — ABNORMAL HIGH (ref 4.0–10.5)
nRBC: 0 % (ref 0.0–0.2)

## 2021-10-23 LAB — RESP PANEL BY RT-PCR (FLU A&B, COVID) ARPGX2
Influenza A by PCR: NEGATIVE
Influenza B by PCR: NEGATIVE
SARS Coronavirus 2 by RT PCR: NEGATIVE

## 2021-10-23 LAB — TROPONIN I (HIGH SENSITIVITY)
Troponin I (High Sensitivity): 230 ng/L (ref <18)
Troponin I (High Sensitivity): 268 ng/L (ref <18)

## 2021-10-23 MED ORDER — ASPIRIN 81 MG PO CHEW: 324.0000 mg | CHEWABLE_TABLET | Freq: Once | ORAL | Status: DC

## 2021-10-23 NOTE — ED Triage Notes (Signed)
The pt is also c/o a headache for several days  she reports that she has used up her tylenol

## 2021-10-23 NOTE — ED Notes (Signed)
Dr. Johnney Killian ( EDP) / charge nurse notified on patient's elevated Troponin result.

## 2021-10-23 NOTE — ED Provider Notes (Signed)
[facility] EMERGENCY DEPARTMENT Provider Note   CSN: [id number] Arrival date & time: 10/23/21  1644     History  Chief Complaint  Patient presents with   Altered Mental Status    Sarah [name] is a 76 y.o. female. With past medical history of type 2 DM, HTN, stroke, CKD II, CAD who presents to the emergency department with altered mental status.  States symptoms began about 2 days ago. She states that she began having headache that she describes as intermittent, throbbing, frontal. She states she has felt off balance and had fall in the past two days but denies dizziness or lightheadedness. States she has had intermittent blurry vision but again is unable to elaborate. Additionally, has felt generally weak and unwell but is unable to elaborate on this. She denies having fever, cough, congestion, abdominal pain, n/v/d. She states she has had very mild SOB with exertion over the past two days. Denies chest pain or palpitations.   She lives with 13 year old mother, who apparently was concerned about her mental status. Has had 2 previous strokes and apparently went out to the garage to grab soda. She came back inside without beverage and forgot why she was out there. Family at bedside also states this is not her usual behavior but have trouble elaborating.    Altered Mental Status Presenting symptoms: confusion   Associated symptoms: headaches        Home Medications Prior to Admission medications   Medication Sig Start Date End Date Taking? Authorizing Provider  amLODipine (NORVASC) 5 MG tablet Take 1 tablet (5 mg total) by mouth daily. 10/07/21   Imogene Burn, PA-C  atorvastatin (LIPITOR) 80 MG tablet TAKE 1 TABLET(80 MG) BY MOUTH DAILY AT 6 PM 05/18/21   [name], [name], MD  Blood Glucose Calibration (OT ULTRA/FASTTK CNTRL SOLN) SOLN  07/16/17   [provider]  clopidogrel (PLAVIX) 75 MG tablet TAKE 1 TABLET(75 MG) BY MOUTH DAILY 12/15/20   [name],  [name], MD  Continuous Blood Gluc Sensor (DEXCOM G6 SENSOR) MISC 1 Device by Does not apply route as directed. 03/03/20   Shamleffer, Melanie Crazier, MD  Continuous Blood Gluc Transmit (DEXCOM G6 TRANSMITTER) MISC 1 Device by Does not apply route as directed. 03/03/20   Shamleffer, Melanie Crazier, MD  dapagliflozin propanediol (FARXIGA) 10 MG TABS tablet Take 1 tablet (10 mg total) by mouth daily. 05/01/21   Shamleffer, Melanie Crazier, MD  ezetimibe (ZETIA) 10 MG tablet TAKE 1 TABLET(10 MG) BY MOUTH DAILY 07/07/20   [name], [name], MD  furosemide (LASIX) 20 MG tablet TAKE 1 TABLET(20 MG) BY MOUTH DAILY Patient taking differently: 40 mg. 05/30/20   [name], [name], MD  gabapentin (NEURONTIN) 100 MG capsule Take 1-2 capsules (100-200 mg total) by mouth at bedtime. 10/20/21 11/19/21  [name], [name], MD  hydrALAZINE (APRESOLINE) 10 MG tablet Take 1 tablet (10 mg total) by mouth 3 (three) times daily. 10/07/21   Imogene Burn, PA-C  insulin glargine, 1 Unit Dial, (TOUJEO SOLOSTAR) 300 UNIT/ML Solostar Pen Inject 80 Units into the skin daily in the afternoon. 08/31/21   Shamleffer, Melanie Crazier, MD  insulin lispro (HUMALOG KWIKPEN) 200 UNIT/ML KwikPen Max daily 50 units Patient taking differently: Inject 14 Units into the skin 3 (three) times daily with meals. 05/01/21   Shamleffer, Melanie Crazier, MD  Insulin Pen Needle 32G X 4 MM MISC 1 Device by Does not apply route in the morning, at noon, in  the evening, and at bedtime. 05/01/21   Shamleffer, Melanie Crazier, MD  Lancet Devices (SIMPLE DIAGNOSTICS LANCING DEV) MISC Check BS before and 2 hours after meals. 09/26/17   [name], [name], MD  latanoprost (XALATAN) 0.005 % ophthalmic solution Place 1 drop into both eyes at bedtime.    [provider]  liraglutide (VICTOZA) 18 MG/3ML SOPN Inject 1.8 mg into the skin daily. 05/01/21   Shamleffer, Melanie Crazier, MD  losartan (COZAAR) 100 MG tablet Take 100 mg by mouth daily.    [provider]  losartan (COZAAR) 50 MG tablet TAKE 1 TABLET(50 MG) BY MOUTH DAILY 01/08/20   [name], [name], MD  Magnesium 400 MG TABS Take 400 mg by mouth daily.    [provider]  metoprolol tartrate (LOPRESSOR) 25 MG tablet Take 1 tablet (25 mg total) by mouth 2 (two) times daily. Patient taking differently: Take 25 mg by mouth daily. 09/22/20 06/15/21  Chandrasekhar, Lyda Kalata A, MD  Na Sulfate-K Sulfate-Mg Sulf (SUPREP BOWEL PREP KIT) 17.5-3.13-1.6 GM/177ML SOLN Take 1 kit by mouth as directed. 10/02/21   Mansouraty, Telford Nab., MD  nitroGLYCERIN (NITROSTAT) 0.4 MG SL tablet Place 1 tablet (0.4 mg total) under the tongue every 5 (five) minutes as needed for chest pain. one tab every 5 minutes up to 3 tablets total over 15 minutes. 02/29/20   Werner Lean, MD  ondansetron (ZOFRAN) 4 MG tablet Take 1 tablet (4 mg total) by mouth every 8 (eight) hours as needed for nausea or vomiting. 10/01/21   Mansouraty, Telford Nab., MD  ONE TOUCH ULTRA TEST test strip Check BS before and 2 hours after meals. 09/26/17   [name], [name], MD  sertraline (ZOLOFT) 50 MG tablet Take 1 tablet (50 mg total) by mouth daily. 11/17/20   [name], [name], MD  traMADol (ULTRAM) 50 MG tablet Take 1 tablet (50 mg total) by mouth daily as needed. 10/20/21 11/19/21  [name], [name], MD      Allergies    Patient has no known allergies.    Review of Systems   Review of Systems  Neurological:  Positive for headaches.  Psychiatric/Behavioral:  Positive for confusion.   All other systems reviewed and are negative.   Physical Exam Updated Vital Signs BP (!) 187/75   Pulse 85   Temp 98.7 F (37.1 C) (Oral)   Resp 15   Ht 5' (1.524 m)   Wt 54.9 kg   SpO2 93%   BMI 23.64 kg/m  Physical Exam Vitals and nursing note reviewed.  Constitutional:      General: She is not in acute distress.    Appearance: Normal appearance. She is ill-appearing. She is not toxic-appearing.  HENT:     Head: Normocephalic and atraumatic.      Mouth/Throat:     Mouth: Mucous membranes are dry.     Pharynx: Oropharynx is clear.  Eyes:     General: No scleral icterus.    Extraocular Movements: Extraocular movements intact.     Pupils: Pupils are equal, round, and reactive to light.  Cardiovascular:     Rate and Rhythm: Normal rate and regular rhythm.     Pulses: Normal pulses.     Heart sounds: Murmur heard.  Pulmonary:     Effort: Pulmonary effort is normal. No respiratory distress.     Breath sounds: Normal breath sounds.  Abdominal:     General: Bowel sounds are normal. There is no distension.     Palpations:  Abdomen is soft.     Tenderness: There is no abdominal tenderness.  Musculoskeletal:        General: Normal range of motion.     Cervical back: Normal range of motion and neck supple.  Skin:    General: Skin is warm and dry.     Capillary Refill: Capillary refill takes less than 2 seconds.  Neurological:     Mental Status: She is alert. She is confused.     Cranial Nerves: Cranial nerves 2-12 are intact. No facial asymmetry.     Sensory: Sensation is intact.     Motor: Weakness present. No pronator drift.     Coordination: Finger-Nose-Finger Test abnormal.     Comments: Strength 4/5 RLE, 5/5 LLE  Psychiatric:        Attention and Perception: Attention normal.        Mood and Affect: Mood normal.        Behavior: Behavior is cooperative.        Cognition and Memory: Cognition is impaired. She exhibits impaired recent memory.     ED Results / Procedures / Treatments   Labs (all labs ordered are listed, but only abnormal results are displayed) Labs Reviewed  BASIC METABOLIC PANEL - Abnormal; Notable for the following components:      Result Value   Potassium 3.0 (*)    Glucose, Bld 315 (*)    Creatinine, Ser 1.15 (*)    Calcium 8.5 (*)    GFR, Estimated 50 (*)    All other components within normal limits  CBC WITH DIFFERENTIAL/PLATELET - Abnormal; Notable for the following components:   WBC 11.5  (*)    Platelets 415 (*)    Neutro Abs 8.5 (*)    All other components within normal limits  URINALYSIS, ROUTINE W REFLEX MICROSCOPIC - Abnormal; Notable for the following components:   APPearance HAZY (*)    Specific Gravity, Urine 1.001 (*)    Glucose, UA >=500 (*)    Hgb urine dipstick SMALL (*)    Protein, ur 100 (*)    Bacteria, UA RARE (*)    All other components within normal limits  TROPONIN I (HIGH SENSITIVITY) - Abnormal; Notable for the following components:   Troponin I (High Sensitivity) 230 (*)    All other components within normal limits  TROPONIN I (HIGH SENSITIVITY) - Abnormal; Notable for the following components:   Troponin I (High Sensitivity) 268 (*)    All other components within normal limits  RESP PANEL BY RT-PCR (FLU A&B, COVID) [id number]    EKG EKG Interpretation  Date/Time:  Friday October 23 2021 23:05:11 EDT Ventricular Rate:  83 PR Interval:  156 QRS Duration: 91 QT Interval:  383 QTC Calculation: 450 R Axis:   43 Text Interpretation: Sinus rhythm Left atrial enlargement Evolving T wave inversions from prior EKG earlier today Confirmed by Cindee Lame [phone]) on 10/23/2021 11:13:08 PM  Radiology DG Chest Port 1 View  Result Date: 10/23/2021 CLINICAL DATA:  Chest pain. EXAM: PORTABLE CHEST 1 VIEW COMPARISON:  Radiograph 10/16/2016 FINDINGS: The cardiomediastinal contours are normal. Loop recorder in the left chest wall. Pulmonary vasculature is normal. No consolidation, pleural effusion, or pneumothorax. No acute osseous abnormalities are seen. IMPRESSION: No acute chest findings. Electronically Signed   By: Keith Rake M.D.   On: 10/23/2021 23:32   CT Head Wo Contrast  Result Date: 10/23/2021 CLINICAL DATA:  Transient ischemic attack. Frontal headaches for 2 days. EXAM: CT HEAD WITHOUT CONTRAST  TECHNIQUE: Contiguous axial images were obtained from the base of the skull through the vertex without intravenous contrast. RADIATION DOSE  REDUCTION: This exam was performed according to the departmental dose-optimization program which includes automated exposure control, adjustment of the mA and/or kV according to patient size and/or use of iterative reconstruction technique. COMPARISON:  MRI 03/09/2019.  CT 02/08/2018 FINDINGS: Brain: There is a large area of encephalomalacia involving the right frontal, temporal, and parietal regions. This is consistent with infarct. The infarct has progressed since prior MRI and CT studies. No mass-effect or midline shift. No abnormal extra-axial fluid collections. Basal cisterns are not effaced. Mild cerebral atrophy. Ventricular dilatation consistent with central atrophy. Low-attenuation changes in the deep white matter consistent small vessel ischemic change. No acute intracranial hemorrhage. Vascular: Intracranial arterial calcifications. Skull: Calvarium appears intact. Sinuses/Orbits: Paranasal sinuses and mastoid air cells are clear. Other: None. IMPRESSION: 1. 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 2. No acute intracranial hemorrhage or mass effect. 3. Chronic atrophy and small vessel ischemic changes. Electronically Signed   By: Lucienne Capers M.D.   On: 10/23/2021 20:46    Procedures .Critical Care  Performed by: Mickie Hillier, PA-C Authorized by: Mickie Hillier, PA-C   Critical care provider statement:    Critical care time (minutes):  40   Critical care time was exclusive of:  Separately billable procedures and treating other patients   Critical care was necessary to treat or prevent imminent or life-threatening deterioration of the following conditions:  Cardiac failure and CNS failure or compromise   Critical care was time spent personally by me on the following activities:  Development of treatment plan with patient or surrogate, discussions with consultants, discussions with primary  provider, examination of patient, evaluation of patient's response to treatment, interpretation of cardiac output measurements, obtaining history from patient or surrogate, review of old charts, re-evaluation of patient's condition, pulse oximetry, ordering and review of radiographic studies, ordering and review of laboratory studies and ordering and performing treatments and interventions   I assumed direction of critical care for this patient from another provider in my specialty: no     Care discussed with: admitting provider      Medications Ordered in ED Medications  aspirin chewable tablet 324 mg (has no administration in time range)    ED Course/ Medical Decision Making/ A&P Clinical Course as of 10/24/21 0010  Fri Oct 23, 2021  2313 Repeat EKG once patient room with T wave inversions, acute change from EKG earlier. Delta trop is being drawn. I have consulted cards. Giving 325 chewable aspirin  [LA]    Clinical Course User Index [LA] Mickie Hillier, PA-C                           Medical Decision Making Amount and/or Complexity of Data Reviewed Radiology: ordered.  Risk OTC drugs.  This patient presents to the ED with chief complaint(s) of confusion with pertinent past medical history of CKD, stroke, HTN, DM which further complicates the presenting complaint. The complaint involves an extensive differential diagnosis and treatment options and also carries with it a high risk of complications and morbidity.    The differential diagnosis includes stroke, TIA, CNS infection, encephalopathy, toxidrome, etc.   Additional history obtained: Additional history obtained from family Records reviewed previous admission documents, Care Everywhere/External Records, and Primary Care Documents  ED  Course: Lab Tests: I Ordered, and personally interpreted labs.  The pertinent results include:   Trop 230, 268, delta 38. Cr 1.15 stable K+ 3, can be replaced on admission  UA  negative Imaging Studies: I ordered and independently visualized and interpreted the following imaging CT scan head, X-ray chest, and MRI brain   which showed CT head shows large infarct in the distribution of the right MCA.  Progression since 2021.  X-ray negative.  MRI brain is pending The interpretation of the imaging was limited to assessing for emergent pathology, for which purpose it was ordered. Cardiac Monitoring: The patient was maintained on a cardiac monitor.  I personally viewed and interpreted the cardiac monitor which showed an underlying rhythm of:  sinus rhythm Medicines ordered and prescription drug management: I ordered the following medications aspirin for NSTEMI I considered this additional medications: Heparin, will hold based on head CT Reevaluation of the patient after these medicines showed that the patient    stayed the same  Reassessment and review  76 year old female who presents to the emergency department with confusion.  On exam she is confused to year, month. Knows location. Physical exam with RLE minor weakness compare to the left and equal dysmetria on exam.  Workup was initiated in triage and patient waiting for room around 6 hours prior to me evaluating her. On presentation to room have known troponin of 230. The remainder of her labs are unremarkable. She is not complaining of chest pain. States she may have had some shortness of breath over the past 3 days on exertion but is poor historian. Has murmur on exam but no acute findings. I had nursing repeat EKG on arrival which shows evolution since presentation to ED.  Additionally, CT head ordered in triage shows infarct. ?chronicity of this  Consulted and spoke with Dr. Marcelle Smiling, cardiology who will come see patient. Will hold heparin until neuro sees patient, MR Brain. Will give 324 chewable aspirin. Consulted and spoke with Dr. Lorrin Goodell who will see patient from neuro. Ordered MR brain.  Patient will need  admission for likely NSTEMI and stroke. Dr. Betsey Holiday, ED attending will admit patient to hospital medicine.   Consultations Obtained: I requested consultation with the admitting physician hospitalist and consultant Dr. Marcelle Smiling, cardiology, Dr. Lorrin Goodell, neurology , and discussed  findings as well as pertinent plan - they recommend: Narcisse will see patient. Hold heparin until neuro. Lorrin Goodell will see patient. MR Brain ordered   Complexity of problems addressed: Patient's presentation is most consistent with  acute presentation with potential threat to life or bodily function During patient's assessment  Disposition: After consideration of the diagnostic results and the patient's response to treatment,  I feel that the patent would benefit from admission hospital medicine .  Social Determinants of Health: Patient's  low health literacy   increases the complexity of managing their presentation   Final Clinical Impression(s) / ED Diagnoses Final diagnoses:  Altered mental status, unspecified altered mental status type  NSTEMI (non-ST elevated myocardial infarction) [facility])    Rx / DC Orders ED Discharge Orders     None         Mickie Hillier, PA-C 10/24/21 0010    Audley Hose, MD 10/24/21 [phone]

## 2021-10-23 NOTE — ED Triage Notes (Signed)
The pt arrived by gems from home  confusion since last pm  alert oriented  x2  hyperglycemia

## 2021-10-23 NOTE — ED Provider Triage Note (Signed)
Emergency Medicine Provider Triage Evaluation Note  Sarah [name] , a 76 y.o. female  was evaluated in triage.  Pt complains of not feeling well going over the last couple days, states she feels just tired, states she has had intermittent headache without change in vision paresthesias or weakness Lower extremity surgeries and head trauma, not anticoag's, endorsing fevers chills cough congestion general body aches.  Review of Systems  Positive: Weakness, headache Negative: Chest pain, shortness of breath  Physical Exam  BP (!) 169/84   Pulse 94   Temp 98.8 F (37.1 C)   Resp 18   Ht 5' (1.524 m)   Wt 54.9 kg   SpO2 99%   BMI 23.64 kg/m  Gen:   Awake, no distress   Resp:  Normal effort  MSK:   Moves extremities without difficulty  Other:  Cranial nerves II through XII grossly intact no difficulty with word finding following two-step commands no unilateral weakness present.  Medical Decision Making  Medically screening exam initiated at 7:31 PM.  Appropriate orders placed.  Sarah [name] was informed that the remainder of the evaluation will be completed by another provider, this initial triage assessment does not replace that evaluation, and the importance of remaining in the ED until their evaluation is complete.  Lab work imaging ordered will need further work-up.   Marcello Fennel, PA-C 10/23/21 1932

## 2021-10-24 ENCOUNTER — Inpatient Hospital Stay (HOSPITAL_COMMUNITY): Payer: Medicare Other

## 2021-10-24 DIAGNOSIS — R7989 Other specified abnormal findings of blood chemistry: Secondary | ICD-10-CM | POA: Diagnosis not present

## 2021-10-24 DIAGNOSIS — E782 Mixed hyperlipidemia: Secondary | ICD-10-CM

## 2021-10-24 DIAGNOSIS — I6389 Other cerebral infarction: Secondary | ICD-10-CM | POA: Diagnosis not present

## 2021-10-24 DIAGNOSIS — Z823 Family history of stroke: Secondary | ICD-10-CM | POA: Diagnosis not present

## 2021-10-24 DIAGNOSIS — I63442 Cerebral infarction due to embolism of left cerebellar artery: Secondary | ICD-10-CM | POA: Diagnosis present

## 2021-10-24 DIAGNOSIS — I63412 Cerebral infarction due to embolism of left middle cerebral artery: Secondary | ICD-10-CM

## 2021-10-24 DIAGNOSIS — I10 Essential (primary) hypertension: Secondary | ICD-10-CM | POA: Diagnosis not present

## 2021-10-24 DIAGNOSIS — E876 Hypokalemia: Secondary | ICD-10-CM | POA: Diagnosis present

## 2021-10-24 DIAGNOSIS — E1165 Type 2 diabetes mellitus with hyperglycemia: Secondary | ICD-10-CM | POA: Diagnosis present

## 2021-10-24 DIAGNOSIS — I6340 Cerebral infarction due to embolism of unspecified cerebral artery: Secondary | ICD-10-CM | POA: Diagnosis not present

## 2021-10-24 DIAGNOSIS — H53462 Homonymous bilateral field defects, left side: Secondary | ICD-10-CM | POA: Diagnosis present

## 2021-10-24 DIAGNOSIS — R4182 Altered mental status, unspecified: Secondary | ICD-10-CM | POA: Diagnosis present

## 2021-10-24 DIAGNOSIS — I6523 Occlusion and stenosis of bilateral carotid arteries: Secondary | ICD-10-CM

## 2021-10-24 DIAGNOSIS — Z955 Presence of coronary angioplasty implant and graft: Secondary | ICD-10-CM | POA: Diagnosis not present

## 2021-10-24 DIAGNOSIS — N182 Chronic kidney disease, stage 2 (mild): Secondary | ICD-10-CM | POA: Diagnosis present

## 2021-10-24 DIAGNOSIS — Z7984 Long term (current) use of oral hypoglycemic drugs: Secondary | ICD-10-CM | POA: Diagnosis not present

## 2021-10-24 DIAGNOSIS — R404 Transient alteration of awareness: Secondary | ICD-10-CM

## 2021-10-24 DIAGNOSIS — Z794 Long term (current) use of insulin: Secondary | ICD-10-CM | POA: Diagnosis not present

## 2021-10-24 DIAGNOSIS — D631 Anemia in chronic kidney disease: Secondary | ICD-10-CM | POA: Diagnosis present

## 2021-10-24 DIAGNOSIS — I214 Non-ST elevation (NSTEMI) myocardial infarction: Secondary | ICD-10-CM

## 2021-10-24 DIAGNOSIS — I2489 Other forms of acute ischemic heart disease: Secondary | ICD-10-CM | POA: Diagnosis present

## 2021-10-24 DIAGNOSIS — E785 Hyperlipidemia, unspecified: Secondary | ICD-10-CM | POA: Diagnosis present

## 2021-10-24 DIAGNOSIS — Z79899 Other long term (current) drug therapy: Secondary | ICD-10-CM | POA: Diagnosis not present

## 2021-10-24 DIAGNOSIS — I129 Hypertensive chronic kidney disease with stage 1 through stage 4 chronic kidney disease, or unspecified chronic kidney disease: Secondary | ICD-10-CM | POA: Diagnosis present

## 2021-10-24 DIAGNOSIS — I2510 Atherosclerotic heart disease of native coronary artery without angina pectoris: Secondary | ICD-10-CM | POA: Diagnosis present

## 2021-10-24 DIAGNOSIS — N179 Acute kidney failure, unspecified: Secondary | ICD-10-CM | POA: Diagnosis present

## 2021-10-24 DIAGNOSIS — Z1152 Encounter for screening for COVID-19: Secondary | ICD-10-CM | POA: Diagnosis not present

## 2021-10-24 DIAGNOSIS — G9341 Metabolic encephalopathy: Secondary | ICD-10-CM | POA: Diagnosis present

## 2021-10-24 DIAGNOSIS — R739 Hyperglycemia, unspecified: Secondary | ICD-10-CM | POA: Diagnosis not present

## 2021-10-24 DIAGNOSIS — G8311 Monoplegia of lower limb affecting right dominant side: Secondary | ICD-10-CM | POA: Diagnosis present

## 2021-10-24 DIAGNOSIS — I69312 Visuospatial deficit and spatial neglect following cerebral infarction: Secondary | ICD-10-CM | POA: Diagnosis not present

## 2021-10-24 DIAGNOSIS — E1122 Type 2 diabetes mellitus with diabetic chronic kidney disease: Secondary | ICD-10-CM | POA: Diagnosis present

## 2021-10-24 DIAGNOSIS — I639 Cerebral infarction, unspecified: Secondary | ICD-10-CM | POA: Diagnosis not present

## 2021-10-24 DIAGNOSIS — E11649 Type 2 diabetes mellitus with hypoglycemia without coma: Secondary | ICD-10-CM | POA: Diagnosis not present

## 2021-10-24 DIAGNOSIS — Z87891 Personal history of nicotine dependence: Secondary | ICD-10-CM | POA: Diagnosis not present

## 2021-10-24 DIAGNOSIS — E1142 Type 2 diabetes mellitus with diabetic polyneuropathy: Secondary | ICD-10-CM | POA: Diagnosis not present

## 2021-10-24 LAB — BASIC METABOLIC PANEL
Anion gap: 12 (ref 5–15)
BUN: 16 mg/dL (ref 8–23)
CO2: 24 mmol/L (ref 22–32)
Calcium: 8.1 mg/dL — ABNORMAL LOW (ref 8.9–10.3)
Chloride: 100 mmol/L (ref 98–111)
Creatinine, Ser: 1.02 mg/dL — ABNORMAL HIGH (ref 0.44–1.00)
GFR, Estimated: 58 mL/min — ABNORMAL LOW (ref >60)
Glucose, Bld: 368 mg/dL — ABNORMAL HIGH (ref 70–99)
Potassium: 3.3 mmol/L — ABNORMAL LOW (ref 3.5–5.1)
Sodium: 136 mmol/L (ref 135–145)

## 2021-10-24 LAB — TSH: TSH: 1.591 u[IU]/mL (ref 0.350–4.500)

## 2021-10-24 LAB — CBC
HCT: 35.2 % — ABNORMAL LOW (ref 36.0–46.0)
Hemoglobin: 11.9 g/dL — ABNORMAL LOW (ref 12.0–15.0)
MCH: 31.2 pg (ref 26.0–34.0)
MCHC: 33.8 g/dL (ref 30.0–36.0)
MCV: 92.1 fL (ref 80.0–100.0)
Platelets: 343 10*3/uL (ref 150–400)
RBC: 3.82 MIL/uL — ABNORMAL LOW (ref 3.87–5.11)
RDW: 13.1 % (ref 11.5–15.5)
WBC: 11.1 10*3/uL — ABNORMAL HIGH (ref 4.0–10.5)
nRBC: 0 % (ref 0.0–0.2)

## 2021-10-24 LAB — ECHOCARDIOGRAM COMPLETE
AR max vel: 2.52 cm2
AV Area VTI: 2.58 cm2
AV Area mean vel: 2.54 cm2
AV Mean grad: 3 mmHg
AV Peak grad: 5.4 mmHg
Ao pk vel: 1.16 m/s
Area-P 1/2: 2.93 cm2
Height: 60 in
S' Lateral: 2.2 cm
Weight: 1936.52 oz

## 2021-10-24 LAB — LIPID PANEL
Cholesterol: 362 mg/dL — ABNORMAL HIGH (ref 0–200)
HDL: 93 mg/dL (ref >40)
LDL Cholesterol: 227 mg/dL — ABNORMAL HIGH (ref 0–99)
Total CHOL/HDL Ratio: 3.9 RATIO
Triglycerides: 210 mg/dL — ABNORMAL HIGH (ref <150)
VLDL: 42 mg/dL — ABNORMAL HIGH (ref 0–40)

## 2021-10-24 LAB — GLUCOSE, CAPILLARY: Glucose-Capillary: 76 mg/dL (ref 70–99)

## 2021-10-24 LAB — RAPID URINE DRUG SCREEN, HOSP PERFORMED
Amphetamines: NOT DETECTED
Barbiturates: NOT DETECTED
Benzodiazepines: NOT DETECTED
Cocaine: NOT DETECTED
Opiates: NOT DETECTED
Tetrahydrocannabinol: NOT DETECTED

## 2021-10-24 LAB — FOLATE: Folate: 12.3 ng/mL (ref >5.9)

## 2021-10-24 LAB — AMMONIA: Ammonia: 29 umol/L (ref 9–35)

## 2021-10-24 LAB — CBG MONITORING, ED
Glucose-Capillary: 315 mg/dL — ABNORMAL HIGH (ref 70–99)
Glucose-Capillary: 269 mg/dL — ABNORMAL HIGH (ref 70–99)

## 2021-10-24 LAB — VITAMIN B12: Vitamin B-12: 234 pg/mL (ref 180–914)

## 2021-10-24 MED ORDER — HEPARIN SODIUM (PORCINE) 5000 UNIT/ML IJ SOLN
5000.0000 [IU] | Freq: Three times a day (TID) | INTRAMUSCULAR | Status: DC
  Administered 2021-10-24 – 2021-10-28 (×13): 5000 [IU] via SUBCUTANEOUS
  Filled 2021-10-24 (×13): qty 1

## 2021-10-24 MED ORDER — ONDANSETRON HCL 4 MG/2ML IJ SOLN: 4.0000 mg | Freq: Four times a day (QID) | INTRAMUSCULAR | Status: DC | PRN

## 2021-10-24 MED ORDER — CLOPIDOGREL BISULFATE 75 MG PO TABS
75.0000 mg | ORAL_TABLET | Freq: Every day | ORAL | Status: DC
  Administered 2021-10-24 – 2021-10-28 (×5): 75 mg via ORAL
  Filled 2021-10-24 (×5): qty 1

## 2021-10-24 MED ORDER — ASPIRIN 81 MG PO TBEC
81.0000 mg | DELAYED_RELEASE_TABLET | Freq: Every day | ORAL | Status: DC
  Administered 2021-10-24 – 2021-10-28 (×5): 81 mg via ORAL
  Filled 2021-10-24 (×5): qty 1

## 2021-10-24 MED ORDER — NITROGLYCERIN 0.4 MG SL SUBL: 0.4000 mg | SUBLINGUAL_TABLET | SUBLINGUAL | Status: DC | PRN

## 2021-10-24 MED ORDER — STROKE: EARLY STAGES OF RECOVERY BOOK
Freq: Once | Status: AC
  Filled 2021-10-24: qty 1

## 2021-10-24 MED ORDER — SODIUM CHLORIDE 0.9 % IV SOLN: INTRAVENOUS | Status: AC

## 2021-10-24 MED ORDER — ACETAMINOPHEN 325 MG PO TABS
650.0000 mg | ORAL_TABLET | ORAL | Status: DC | PRN
  Administered 2021-10-26 – 2021-10-28 (×3): 650 mg via ORAL
  Filled 2021-10-24 (×3): qty 2

## 2021-10-24 MED ORDER — INSULIN GLARGINE-YFGN 100 UNIT/ML ~~LOC~~ SOLN
80.0000 [IU] | Freq: Every day | SUBCUTANEOUS | Status: DC
  Administered 2021-10-24 – 2021-10-25 (×2): 80 [IU] via SUBCUTANEOUS
  Filled 2021-10-24 (×4): qty 0.8

## 2021-10-24 MED ORDER — ATORVASTATIN CALCIUM 80 MG PO TABS
80.0000 mg | ORAL_TABLET | Freq: Every day | ORAL | Status: DC
  Administered 2021-10-24 – 2021-10-28 (×5): 80 mg via ORAL
  Filled 2021-10-24: qty 2
  Filled 2021-10-24 (×4): qty 1

## 2021-10-24 MED ORDER — EZETIMIBE 10 MG PO TABS
10.0000 mg | ORAL_TABLET | Freq: Every day | ORAL | Status: DC
  Administered 2021-10-24 – 2021-10-28 (×5): 10 mg via ORAL
  Filled 2021-10-24 (×5): qty 1

## 2021-10-24 NOTE — Progress Notes (Addendum)
STROKE TEAM PROGRESS NOTE   INTERVAL HISTORY No family is present at the bedside during examination. Patient ambulating to restroom with 2 person assistance on initial evaluation.  States that her wobbly gait has improved today but she does not quite feel back to her baseline.  States that she does not take her medications as regularly as she should and only sometimes takes her Zetia and atorvastatin.   Vitals:   10/24/21 0506 10/24/21 0530 10/24/21 0700 10/24/21 0710  BP:  (!) 160/64 (!) 174/77 (!) 177/77  Pulse:  79 77 76  Resp:    17  Temp: 98.3 F (36.8 C)     TempSrc: Oral     SpO2:  95% 95% 95%  Weight:      Height:       CBC:  Recent Labs  Lab 10/23/21 1941 10/24/21 0511  WBC 11.5* 11.1*  NEUTROABS 8.5*  --   HGB 13.0 11.9*  HCT 39.7 35.2*  MCV 93.9 92.1  PLT 415* 532   Basic Metabolic Panel:  Recent Labs  Lab 10/23/21 1941 10/24/21 0512  NA 140 136  K 3.0* 3.3*  CL 101 100  CO2 25 24  GLUCOSE 315* 368*  BUN 14 16  CREATININE 1.15* 1.02*  CALCIUM 8.5* 8.1*   Lipid Panel:  Recent Labs  Lab 10/24/21 0512  CHOL 362*  TRIG 210*  HDL 93  CHOLHDL 3.9  VLDL 42*  LDLCALC 227*   HgbA1c: No results for input(s): "HGBA1C" in the last 168 hours. Urine Drug Screen: No results for input(s): "LABOPIA", "COCAINSCRNUR", "LABBENZ", "AMPHETMU", "THCU", "LABBARB" in the last 168 hours.  Alcohol Level No results for input(s): "ETH" in the last 168 hours.  IMAGING past 24 hours MR BRAIN WO CONTRAST  Result Date: 10/24/2021 CLINICAL DATA:  Altered mental status EXAM: MRI HEAD WITHOUT CONTRAST TECHNIQUE: Multiplanar, multiecho pulse sequences of the brain and surrounding structures were obtained without intravenous contrast. COMPARISON:  03/09/2019 FINDINGS: Brain: Punctate acute infarcts of the left cerebellum and left parietal lobe. 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 No acute hemorrhage. There is  multifocal hyperintense T2-weighted signal within the white matter. Generalized volume loss. Old posterior right MCA territory infarct and punctate old right cerebellar infarct. The midline structures are normal. Vascular: Major flow voids are preserved. Skull and upper cervical spine: Normal calvarium and skull base. Visualized upper cervical spine and soft tissues are normal. Sinuses/Orbits:No paranasal sinus fluid levels or advanced mucosal thickening. No mastoid or middle ear effusion. Normal orbits. IMPRESSION: 1. Punctate acute infarcts of the left cerebellum and left parietal lobe. No hemorrhage or mass effect. 2. Old posterior right MCA territory infarct and punctate old right cerebellar infarct. Electronically Signed   By: Ulyses Jarred M.D.   On: 10/24/2021 03:26   DG Chest Port 1 View  Result Date: 10/23/2021 CLINICAL DATA:  Chest pain. EXAM: PORTABLE CHEST 1 VIEW COMPARISON:  Radiograph 10/16/2016 FINDINGS: The cardiomediastinal contours are normal. Loop recorder in the left chest wall. Pulmonary vasculature is normal. No consolidation, pleural effusion, or pneumothorax. No acute osseous abnormalities are seen. IMPRESSION: No acute chest findings. Electronically Signed   By: Keith Rake M.D.   On: 10/23/2021 23:32   CT Head Wo Contrast  Result Date: 10/23/2021 CLINICAL DATA:  Transient ischemic attack. Frontal headaches for 2 days. EXAM: CT HEAD WITHOUT CONTRAST TECHNIQUE: Contiguous axial images were obtained from the base of the skull through the vertex  without intravenous contrast. RADIATION DOSE REDUCTION: This exam was performed according to the departmental dose-optimization program which includes automated exposure control, adjustment of the mA and/or kV according to patient size and/or use of iterative reconstruction technique. COMPARISON:  MRI 03/09/2019.  CT 02/08/2018 FINDINGS: Brain: There is a large area of encephalomalacia involving the right frontal, temporal, and parietal  regions. This is consistent with infarct. The infarct has progressed since prior MRI and CT studies. No mass-effect or midline shift. No abnormal extra-axial fluid collections. Basal cisterns are not effaced. Mild cerebral atrophy. Ventricular dilatation consistent with central atrophy. Low-attenuation changes in the deep white matter consistent small vessel ischemic change. No acute intracranial hemorrhage. Vascular: Intracranial arterial calcifications. Skull: Calvarium appears intact. Sinuses/Orbits: Paranasal sinuses and mastoid air cells are clear. Other: None. IMPRESSION: 1. 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 2. No acute intracranial hemorrhage or mass effect. 3. Chronic atrophy and small vessel ischemic changes. Electronically Signed   By: Lucienne Capers M.D.   On: 10/23/2021 20:46    PHYSICAL EXAM  Physical Exam  Constitutional: Appears well-developed and well-nourished.  Psych: Affect appropriate to situation, calm and cooperative with exam  Eyes: No scleral injection Respiratory: Effort normal, non-labored breathing on room air  Neuro: Mental Status: Patient is awake, alert, oriented to person, place, year, and situation. She initially incorrectly states that the month is November but once asked again she states correctly that the month is October.  Patient is able to give a clear and coherent history of present illness.  Speech is fluent, comprehension is intact.  No signs of aphasia or neglect. Cranial Nerves: II:  Left homonymous hemianopsia  III,IV, VI: EOMI without ptosis or diploplia.  V: Facial sensation is symmetric to light touch  VII: Facial movement is symmetric resting and with movement  VIII: Hearing is intact to voice X: Palate elevates symmetrically XI: Shoulder shrug is symmetric. XII: Tongue protrudes midline  Motor: Tone is normal. Bulk is normal. 5/5 strength  was present in all four extremities without vertical drift or asymmetry. Sensory: Sensation is symmetric to light touch in the arms and legs. No extinction to DSS present.  Cerebellar: FNF and HKS are intact bilaterally  ASSESSMENT/PLAN Sarah [name] is a 76 y.o. female with history of hypertension, remote R MCA stroke with residual partial left homonymous hemianopsia of unclear etiology, remote tobacco use history- quit smoking in 2018, osteoporosis, CAD s/p DES to RCA 03/2020 and DM2 presenting with acute encephalopathy with memory lapses and feeling unsteady/wobbly with walking.   Stroke - Punctate left cerebellar and left parietal lobe infarctions, likely an incidental finding on MRI during encephalopathy work up, etiology not clear, cardioembolic pattern.  CT head chronic large infarct in the distribution of the right MCA  MRI punctate acute infarcts of the left cerebellum and left parietal lobe without hemorrhage or mass effect. Old posterior right MCA territory infarct and punctate old right cerebellar infarct.  MRA no emergent finding. Bilateral M1 segment stenosis, particularly advanced on the right at the location of her previous RMCA infarction.  Carotid Doppler unremarkable EEG normal  2D Echo LVEF 60-65% Loop recorder placed in the past without evidence of atrial fibrillation. Will need 30 day event monitor at outpatient.  LDL 227 HgbA1c pending, but 11.7 (08/2021) VTE prophylaxis - heparin  clopidogrel 75 mg daily prior to admission, now on aspirin 81 mg daily and clopidogrel 75 mg  daily for 3 weeks and then plavix alone.  Therapy recommendations:  pending  Disposition:  pending  Encephalopathy, etiology unclear WBC elevated at 11.5->11.1, patient has remained afebrile UA negative RVP negative  CXR negative Patient mental status improving on exam 10/14  History of stroke  stroke in 10/2016 with left-sided weakness.  MRI showed right MCA scattered infarcts.  MRA  showed right M2 occlusion.  CTA neck unremarkable.  Carotid Doppler right 40 to 59% stenosis.  EF 55 to 60%.  TEE unremarkable, LDL 98 and A1c 12.9.  Loop recorder placed.  Patient was discharged with DAPT and Lipitor 40. Still in 01/2018 with left hemianopia. CT as well as MRI showed right inferior MCA infarct.  MRA head and neck showed progression of intracranial stenosis from 2018 with right distal M1 and proximal M2 high-grade stenosis as well as left M1 moderate to severe stenosis. A1c 13.5 and LDL 76. EF 60 to 65%.  Loop recorder interrogated, no A. fib found.  Discharged on DAPT and Lipitor 80. Follows with Dr. Leonie Man / Frann Rider at [facility], last seen 09/11/2020   Hypertension Home meds:  Norvasc, furosemide, hydralazine, losartan   Unstable on the high end Gradually normalize BP in 2-3 days Long-term BP goal normotensive  Hyperlipidemia Home meds:  atorvastatin, zetia, resumed in hospital LDL significantly elevated at 227 despite multiple medications, goal < 70 Patient is on high intensity statin, will need to follow in the lipid clinic for further lipid management  Continue statin and zetia at discharge  Diabetes type II Uncontrolled Home meds:  insulin lispro, glargine, farxiga  HgbA1c pending, but 11.7 (08/2021), goal < 7.0 CBG monitoring SSI Close PCP follow-up for better DM control  Other Stroke Risk Factors Advanced Age >/= 9  Remote Cigarette smoker; quit smoking in 2018 Family hx stroke (father, maternal uncle, paternal uncle) CAD s/p DES to RCA 03/2020   Other Active Problems CKD stage II  Elevated troponin level, cardiology on board, likely demand ischemia, continue DAPT  Hospital day # 0  -- Sarah [name], AGACNP-BC [facility] [phone]  ATTENDING NOTE: I reviewed above note and agree with the assessment and plan. Pt was seen and examined.   76 year old female with history of hypertension, hyperlipidemia, diabetes, stroke in 2018 and 2020  admitted for confusion and wobbly walking.  CT showed old right MCA infarct.  MRI showed punctate left cerebellum and left parietal infarcts.  MRI showed bilateral M1 segmental stenosis, right more than left.  Carotid Doppler unremarkable, EF 60 to 65%.  LDL 227, A1c in 08/2021 was 11.7, UDS negative. EEG normal. Creatinine 1.02.  WBC 11.5-11.1.  Patient had stroke in 2018 and 2020 involving right MCA due to right MCA stenosis.  2D echo and a TEE unremarkable, loop recorder placed but no A-fib found.  Both time A1c was high.  On Plavix and statin PTA.  On exam this time, no family at bedside.  Patient lying in bed, awake alert orientated x3, no aphasia, fluent language, follows simple commands.  Has left lower quadrantanopsia, right nasolabial fold flattening, no gaze palsy.  Left upper extremity 4+/5, left lower extremity proximal 4+/5, distal 5/5.  Left finger-to-nose mild dysmetria.  Patient current stroke cannot explain her encephalopathy on presentation, continue encephalopathy work-up per primary team and treat accordingly.  Etiology for patient current incidental stroke concerning for cardioembolic source given 2 different distribution, however loop recorder in the past showed no A-fib and patient has significant controlled stroke risk factors.  Recommend to  continue DAPT for 3 weeks and then Plavix alone, continue statin and Zetia, need to follow-up with lipid clinic for better LDL control.  Aggressive risk factor modification.  Recommend 30-day CardioNet monitoring as outpatient to rule out A-fib.  Patient has a follow-up with Frann Rider on 11/04/2021 at [facility].  For detailed assessment and plan, please refer to above/below as I have made changes wherever appropriate.   Rosalin Hawking, MD PhD Stroke Neurology 10/24/2021 4:30 PM    To contact Stroke Continuity provider, please refer to [email or web address] After hours, contact General Neurology

## 2021-10-24 NOTE — ED Notes (Signed)
Pt ambulatory to restroom with standby assistance

## 2021-10-24 NOTE — ED Notes (Signed)
Pt ambulatory to and from restroom with standby assistance. Pt assisted to bedside chair, neurology at bedside.

## 2021-10-24 NOTE — Consult Note (Signed)
NEUROLOGY CONSULTATION NOTE   Date of service: October 24, 2021 Patient Name: Sarah [name] MRN:  [id number] DOB:  [birth year] Reason for consult: "confusion" Requesting Provider: Orpah Greek, * _ _ _   _ __   _ __ _ _  __ __   _ __   __ _  History of Present Illness  Sarah [name] is a 76 y.o. female with PMH significant for HTN, prior R MCA stroke with residual partial left homonymous hemianopsia of unclear etiology, osteoporosis, DM2 who is brought in by EMS for concern for encephalopathy expressed by her family.  She has been having some memory lapses.  She went out to the garage to grab a soda from the fridge however, she came back without ever getting the soda.  Patient has also been reporting feeling wobbly while she is walking.  She has CT head without contrast which demonstrates progression of her known right middle cerebral artery infarct compared to her imaging in 2020 and 2021.  However, despite the progression of the infarct, does still appear to be a chronic infarct.  She reports still driving despite vision deficit.  LKW: Unclear mRS: 0 tNKASE: Not offered due to low suspicion for an acute CVA and unclear last known well Thrombectomy: Not offered due to low suspicion for an acute CVA and unclear last known well NIHSS components Score: Comment  1a Level of Conscious 0'[x]'$  1'[]'$  2'[]'$  3'[]'$      1b LOC Questions 0'[x]'$  1'[]'$  2'[]'$       1c LOC Commands 0'[x]'$  1'[]'$  2'[]'$       2 Best Gaze 0'[x]'$  1'[]'$  2'[]'$       3 Visual 0'[]'$  1'[]'$  2'[x]'$  3'[]'$      4 Facial Palsy 0'[x]'$  1'[]'$  2'[]'$  3'[]'$      5a Motor Arm - left 0'[x]'$  1'[]'$  2'[]'$  3'[]'$  4'[]'$  UN'[]'$    5b Motor Arm - Right 0'[x]'$  1'[]'$  2'[]'$  3'[]'$  4'[]'$  UN'[]'$    6a Motor Leg - Left 0'[x]'$  1'[]'$  2'[]'$  3'[]'$  4'[]'$  UN'[]'$    6b Motor Leg - Right 0'[x]'$  1'[]'$  2'[]'$  3'[]'$  4'[]'$  UN'[]'$    7 Limb Ataxia 0'[x]'$  1'[]'$  2'[]'$  3'[]'$  UN'[]'$     8 Sensory 0'[x]'$  1'[]'$  2'[]'$  UN'[]'$      9 Best Language 0'[x]'$  1'[]'$  2'[]'$  3'[]'$      10 Dysarthria 0'[x]'$  1'[]'$  2'[]'$  UN'[]'$      11 Extinct. and Inattention 0'[x]'$  1'[]'$  2'[]'$       TOTAL: 2     ROS    Constitutional Denies weight loss, fever and chills.   HEENT + changes in vision with intact hearing.   Respiratory Denies SOB and cough.   CV Denies palpitations and CP   GI Denies abdominal pain, nausea, vomiting and diarrhea.   GU Denies dysuria and urinary frequency.   MSK Denies myalgia and joint pain.   Skin Denies rash and pruritus.   Neurological Denies headache and syncope.   Psychiatric Denies recent changes in mood. Denies anxiety and depression.    Past History   Past Medical History:  Diagnosis Date   Bilateral carpal tunnel syndrome 03/27/2019   Boils    Glaucoma    Heart murmur    HTN (hypertension)    Hx of adenomatous colonic polyps    Hyperlipidemia    Osteoporosis    Pneumonia    Stroke ([place])    Type II or unspecified type diabetes mellitus without mention of complication, uncontrolled    Past Surgical History:  Procedure Laterality Date   ABDOMINAL HYSTERECTOMY     CARPAL  TUNNEL RELEASE     right   COLONOSCOPY  12-10-10   per Dr. Deatra Ina, clear, repeat in 7 yrs    CORONARY STENT INTERVENTION N/A 03/11/2020   Procedure: CORONARY STENT INTERVENTION;  Surgeon: Jettie Booze, MD;  Location: [facility];  Service: Cardiovascular;  Laterality: N/A;   INCISION AND DRAINAGE PERIRECTAL ABSCESS N/A 09/26/2015   Procedure: IRRIGATION AND DEBRIDEMENT PERIRECTAL ABSCESS;  Surgeon: Mickeal Skinner, MD;  Location: [facility];  Service: General;  Laterality: N/A;   INTRAVASCULAR ULTRASOUND/IVUS N/A 03/11/2020   Procedure: Intravascular Ultrasound/IVUS;  Surgeon: Jettie Booze, MD;  Location: [facility];  Service: Cardiovascular;  Laterality: N/A;   KNEE ARTHROSCOPY     right   LEFT HEART CATH AND CORONARY ANGIOGRAPHY N/A 03/11/2020   Procedure: LEFT HEART CATH AND CORONARY ANGIOGRAPHY;  Surgeon: Jettie Booze, MD;  Location: [facility];  Service: Cardiovascular;  Laterality: N/A;   LOOP RECORDER INSERTION N/A 10/19/2016    Procedure: LOOP RECORDER INSERTION;  Surgeon: Thompson Grayer, MD;  Location: [facility];  Service: Cardiovascular;  Laterality: N/A;   ORIF ANKLE FRACTURE Right 03/24/2012   Procedure: OPEN REDUCTION INTERNAL FIXATION (ORIF) ANKLE FRACTURE;  Surgeon: Newt Minion, MD;  Location: [facility];  Service: Orthopedics;  Laterality: Right;  Open Reduction Internal Fixation Right Bimalleolar ankle fracture   POLYPECTOMY     TEE WITHOUT CARDIOVERSION N/A 10/18/2016   Procedure: TRANSESOPHAGEAL ECHOCARDIOGRAM (TEE);  Surgeon: Fay Records, MD;  Location: [facility];  Service: Cardiovascular;  Laterality: N/A;   TONSILLECTOMY     Family History  Problem Relation Age of Onset   Diabetes Mother    Hypertension Mother    Stroke Father    Stroke Maternal Uncle    Stroke Paternal Uncle    Colon cancer Neg Hx    Esophageal cancer Neg Hx    Rectal cancer Neg Hx    Stomach cancer Neg Hx    Inflammatory bowel disease Neg Hx    Liver disease Neg Hx    Pancreatic cancer Neg Hx    Social History   Socioeconomic History   Marital status: Single    Spouse name: Not on file   Number of children: Not on file   Years of education: Not on file   Highest education level: Not on file  Occupational History   Not on file  Tobacco Use   Smoking status: Former    Types: Cigarettes    Quit date: 10/15/2016    Years since quitting: 5.0   Smokeless tobacco: Never   Tobacco comments:    smokes occ.   Vaping Use   Vaping Use: Never used  Substance and Sexual Activity   Alcohol use: Yes    Alcohol/week: 0.0 standard drinks of alcohol    Comment: occ   Drug use: No   Sexual activity: Not on file  Other Topics Concern   Not on file  Social History Narrative   Not on file   Social Determinants of Health   Financial Resource Strain: Low Risk  (04/29/2021)   Overall Financial Resource Strain (CARDIA)    Difficulty of Paying Living Expenses: Not hard at all  Food Insecurity: No Food Insecurity  (04/29/2021)   Hunger Vital Sign    Worried About Running Out of Food in the Last Year: Never true    Ran Out of Food in the Last Year: Never true  Transportation Needs: Unmet Transportation Needs (10/07/2021)  PRAPARE - Hydrologist (Medical): Yes    Lack of Transportation (Non-Medical): No  Physical Activity: Insufficiently Active (04/29/2021)   Exercise Vital Sign    Days of Exercise per Week: 1 day    Minutes of Exercise per Session: 30 min  Stress: No Stress Concern Present (04/29/2021)   [place]    Feeling of Stress : Not at all  Social Connections: Socially Isolated (04/29/2021)   Social Connection and Isolation Panel [NHANES]    Frequency of Communication with Friends and Family: More than three times a week    Frequency of Social Gatherings with Friends and Family: More than three times a week    Attends Religious Services: Never    Marine scientist or Organizations: No    Attends Archivist Meetings: Never    Marital Status: Never married   No Known Allergies  Medications  (Not in a hospital admission)    Vitals   Vitals:   10/23/21 2230 10/23/21 2239 10/23/21 2245 10/23/21 2300  BP: 104/87  (!) 187/75   Pulse: 87  84 85  Resp: '14  14 15  '$ Temp:      TempSrc:      SpO2: 100% 100% 96% 93%  Weight:      Height:         Body mass index is 23.64 kg/m.  Physical Exam   General: Laying comfortably in bed; in no acute distress.  HENT: Normal oropharynx and mucosa. Normal external appearance of ears and nose.  Neck: Supple, no pain or tenderness  CV: No JVD. No peripheral edema.  Pulmonary: Symmetric Chest rise. Normal respiratory effort.  Abdomen: Soft to touch, non-tender.  Ext: No cyanosis, edema, or deformity  Skin: No rash. Normal palpation of skin.   Musculoskeletal: Normal digits and nails by inspection. No clubbing.   Neurologic Examination   Mental status/Cognition: Alert, oriented to self, place, month and year, good attention. Speech/language: Fluent, comprehension intact, object naming intact, repetition intact.  Cranial nerves:   CN II Pupils equal and reactive to light, inconsistent responses when testing peripheral vision on the left.  High concern for partial left hemianopsia.   CN III,IV,VI EOM intact, no gaze preference or deviation, no nystagmus    CN V normal sensation in V1, V2, and V3 segments bilaterally    CN VII no asymmetry, no nasolabial fold flattening    CN VIII normal hearing to speech    CN IX & X normal palatal elevation, no uvular deviation    CN XI 5/5 head turn and 5/5 shoulder shrug bilaterally    CN XII midline tongue protrusion    Motor:  Muscle bulk: normal, tone normal, pronator drift none Mvmt Root Nerve  Muscle Right Left Comments  SA C5/6 Ax Deltoid 5 5   EF C5/6 Mc Biceps 5 5   EE C6/7/8 Rad Triceps 5 5   WF C6/7 Med FCR     WE C7/8 PIN ECU     F Ab C8/T1 U ADM/FDI 5 5   HF L1/2/3 Fem Illopsoas 5 5   KE L2/3/4 Fem Quad 5 5   DF L4/5 D Peron Tib Ant 5 5   PF S1/2 Tibial Grc/Sol 5 5    Reflexes:  Right Left Comments  Pectoralis      Biceps (C5/6) 2 2   Brachioradialis (C5/6) 2 2    Triceps (C6/7) 2 2  Patellar (L3/4) 2 2    Achilles (S1)      Hoffman      Plantar     Jaw jerk    Sensation:  Light touch Intact throughout   Pin prick    Temperature    Vibration   Proprioception    Coordination/Complex Motor:  - Finger to Nose intact bilaterally - Heel to shin intact bilaterally - Rapid alternating movement are slowed bilaterally - Gait: Deferred for patient's safety. Labs   CBC:  Recent Labs  Lab 10/23/21 1941  WBC 11.5*  NEUTROABS 8.5*  HGB 13.0  HCT 39.7  MCV 93.9  PLT 415*    Basic Metabolic Panel:  Lab Results  Component Value Date   NA 140 10/23/2021   K 3.0 (L) 10/23/2021   CO2 25 10/23/2021   GLUCOSE 315 (H) 10/23/2021   BUN 14 10/23/2021    CREATININE 1.15 (H) 10/23/2021   CALCIUM 8.5 (L) 10/23/2021   GFRNONAA 50 (L) 10/23/2021   GFRAA 103 03/04/2020   Lipid Panel:  Lab Results  Component Value Date   LDLCALC 88 12/26/2020   HgbA1c:  Lab Results  Component Value Date   HGBA1C 11.7 (A) 08/31/2021   Urine Drug Screen:     Component Value Date/Time   LABOPIA NONE DETECTED 01/14/2017 1415   COCAINSCRNUR NONE DETECTED 01/14/2017 1415   LABBENZ NONE DETECTED 01/14/2017 1415   AMPHETMU NONE DETECTED 01/14/2017 1415   THCU NONE DETECTED 01/14/2017 1415   LABBARB NONE DETECTED 01/14/2017 1415    Alcohol Level     Component Value Date/Time   ETH <10 01/14/2017 1206   AKI on BMP. Troponin elevated to 230 --> 268.  CT Head without contrast(Personally reviewed): Large infarct in the distribution of the right middle cerebral artery, demonstrating progression since prior imaging from 2020 and 2021.  MRI Brain: pending  rEEG:  pending  Impression   Sarah [name] is a 76 y.o. female with PMH significant for with PMH significant for HTN, prior R MCA stroke with residual partial left homonymous hemianopsia of unclear etiology, osteoporosis, DM2 who is brought in by EMS for concern for memory lapses. CTH demonstrates progression of prior R MCA infarcts compared to imaging in 2020 and 2021, however still chronic.  Exam with partial left homonymous hemianopsia with inconsistent responses to peripheral vision testing on the left but otherwise awake and oriented x 3 with mild bradyphrenia.  Etiology of her memory lapses is unclear.  Prior outpatient neurology notes do mention concerns about her mild decline of memory but still able to maintain ADLs independently.  It may very well be that she might have a progressive dementia.  Another concern is focal seizures for which I think EEG would be helpful.  We will also get a work-up for reversible causes of dementia including vitamin N62 deficiency, folic acid deficiency,  hypothyroidism.   She also has AKI on BMP and poorly controlled hyperglycemia which could explain concerns for mild confusion expressed by family.  Recommendations  - MRI Brain without contrast to rule out acute stroke. Suspect that the progression of her prior R MCA stroke is likely chronic. No stroke workup if MRI is negative for an acute infarct. - Vit b12, folate, TSH, RPR, Ammonia, UDS. - routine EEG. - I also discussed with patient that she should not drive given partial left hemonoymous hemianopsia. She is high risk for accidents. ______________________________________________________________________   Thank you for the opportunity to take part in the care of  this patient. If you have any further questions, please contact the neurology consultation attending.  Signed,  [place] Pager Number [phone] _ _ _   _ __   _ __ _ _  __ __   _ __   __ _

## 2021-10-24 NOTE — Progress Notes (Signed)
Patient hadn't eaten all day. EEG to be placed once patient is finished.

## 2021-10-24 NOTE — Procedures (Signed)
Routine EEG Report  Sarah [name] is a 76 y.o. female with a history of altered mental status who is undergoing an EEG to evaluate for seizures.  Report: This EEG was acquired with electrodes placed according to the International 10-20 electrode system (including Fp1, Fp2, F3, F4, C3, C4, P3, P4, O1, O2, T3, T4, T5, T6, A1, A2, Fz, Cz, Pz). The following electrodes were missing or displaced: none.  The occipital dominant rhythm was 8.5 Hz. This activity is reactive to stimulation. Drowsiness was manifested by background fragmentation; deeper stages of sleep were identified by K complexes and sleep spindles. There was no focal slowing. There were no interictal epileptiform discharges. There were no electrographic seizures identified. Photic stimulation and hyperventilation were not performed.   Impression: This EEG was obtained while awake and asleep and is normal.    Clinical Correlation: Normal EEGs, however, do not rule out epilepsy.  Su Monks, MD [facility] [phone]  If 7pm- 7am, please page neurology on call as listed in [place].

## 2021-10-24 NOTE — Progress Notes (Signed)
Brief Neuro Update:  MRI Brain shows punctate L ceribellar and L parietal lobe stroke. Likely incidental. Clear for heparin from Neuro tand point if cardiology team would like.  Recommend full stroke workup as below:   - Frequent Neuro checks per stroke unit protocol - Recommend Vascular imaging with CT angio head and neck - Recommend obtaining TTE - Recommend obtaining Lipid panel with LDL - Please start statin if LDL > 70 - Recommend HbA1c - Antithrombotic - if not on Heparin or other anticoagulation, recommend Aspirin '81mg'$  daily along with plavix '75mg'$  daily for 21 days, followed by Aspirin '81mg'$  daily alone. - Recommend DVT ppx if not on AC. - SBP goal - permissive hypertension first 24 h < 220/110. Held home meds.  - Recommend Telemetry monitoring for arrythmia - Recommend bedside swallow screen prior to PO intake. - Stroke education booklet - Recommend PT/OT/SLP consult

## 2021-10-24 NOTE — Progress Notes (Signed)
PROGRESS NOTE    Sarah [name]  SNK:[id number] DOB: 1945-08-02 DOA: 10/23/2021 PCP: [name], [name], MD  Outpatient Specialists:     Brief Narrative:  As per H&P done on admission: "Sarah [name] is a 76 y.o. female with medical history significant of type 2 DM, HTN, stroke, CKD II, CAD  s/p stent  1 year ago, presents to ED with change in [place].  Patient states she does not remember the event from earlier today. However she states she was told she was not  acting like her self and was confused. She states currently she feel back to her baseline. She does however note episode of chills fatigue around 4 days ago but note that they resolved within 24 hours. She then noted HA the next day which she states has been intermittent since then. She notes HA is frontal. She notes no associated n/v/vision loss/ but does endorse episodes of dizziness. She currently denies sob/ chest pain / n/v/d/abdominal pain.  On note patient does have interim history of fall around a week ago after she tripped and fell while trying to move garbage pan with complaints of residual back pain for which she saw pcp and was placed on tramadol.     ED Course:  IN ED patient work notable for elevated CE 230  with EKG noting.  Patient also noted to have [place] that noted extension of old CVA form prior [facility] one year ago.  Cardiology was consulted who solely recommended ASA  in setting of possible acute CVA. Neurology consulted and recommended further imaging but at this juncture believe findings are chronic.  Per neurology no need for cva work up if MRI negative"  10/24/2021: Patient seen.  Confusion is improving.  MRI brain revealed punctate acute infarcts of the left cerebellum and left parietal lobe, no hemorrhage or mass effect.  Old posterior right MCA territory infarct and punctate old right cerebellar infarct were also reported.  Patient is currently on aspirin and Plavix.   Assessment & Plan:   Principal  Problem:   Change in mental status   Acute CVA: -Confusion is improving. -MRI brain findings as documented above. -Continue aspirin and Plavix. -Follow results of EEG. -Neurology is directing care.   Elevated troponin: -Likely type II elevation. -Repeat troponin in the morning. -No chest pain.     DMII - iss/fs    HTN Permissive HTN -hold BP medications for now      AKI on CKDII -AKI slowly resolving. -Suspect prerenal. -Continue to monitor renal function and electrolytes.     FEN Hypokalemia - replete prn  -Monitor closely in setting of renal failure   DVT prophylaxis: Subcutaneous heparin Code Status: Full code Family Communication:  Disposition Plan: Home eventually   Consultants:  Neurology  Procedures:  Patient is undergoing EEG  Antimicrobials:  None   Subjective: No new complaints Confusion is improving  Objective: Vitals:   10/24/21 0830 10/24/21 0950 10/24/21 1000 10/24/21 1030  BP: (!) 180/70  (!) 161/69 (!) 148/67  Pulse: 72  74 75  Resp: '17  17 18  '$ Temp:  98.2 F (36.8 C)    TempSrc:  Oral    SpO2: 95%  96% 95%  Weight:      Height:       No intake or output data in the 24 hours ending 10/24/21 1344 Filed Weights   10/23/21 1552  Weight: 54.9 kg    Examination:  General exam: Appears calm and comfortable  Respiratory  system: Clear to auscultation. Respiratory effort normal. Cardiovascular system: S1 & S2 heard Gastrointestinal system: Abdomen is nondistended, soft and nontender. No organomegaly or masses felt. Normal bowel sounds heard. Central nervous system: Awake and alert.  Patient moves all extremities.   Extremities: Leg edema.  Data Reviewed: I have personally reviewed following labs and imaging studies  CBC: Recent Labs  Lab 10/23/21 1941 10/24/21 0511  WBC 11.5* 11.1*  NEUTROABS 8.5*  --   HGB 13.0 11.9*  HCT 39.7 35.2*  MCV 93.9 92.1  PLT 415* 196   Basic Metabolic Panel: Recent Labs  Lab  10/23/21 1941 10/24/21 0512  NA 140 136  K 3.0* 3.3*  CL 101 100  CO2 25 24  GLUCOSE 315* 368*  BUN 14 16  CREATININE 1.15* 1.02*  CALCIUM 8.5* 8.1*   GFR: Estimated Creatinine Clearance: 37.7 mL/min (A) (by C-G formula based on SCr of 1.02 mg/dL (H)). Liver Function Tests: No results for input(s): "AST", "ALT", "ALKPHOS", "BILITOT", "PROT", "ALBUMIN" in the last 168 hours. No results for input(s): "LIPASE", "AMYLASE" in the last 168 hours. Recent Labs  Lab 10/24/21 0455  AMMONIA 29   Coagulation Profile: No results for input(s): "INR", "PROTIME" in the last 168 hours. Cardiac Enzymes: No results for input(s): "CKTOTAL", "CKMB", "CKMBINDEX", "TROPONINI" in the last 168 hours. BNP (last 3 results) No results for input(s): "PROBNP" in the last 8760 hours. HbA1C: No results for input(s): "HGBA1C" in the last 72 hours. CBG: Recent Labs  Lab 10/24/21 1033 10/24/21 1227  GLUCAP 315* 269*   Lipid Profile: Recent Labs    10/24/21 0512  CHOL 362*  HDL 93  LDLCALC 227*  TRIG 210*  CHOLHDL 3.9   Thyroid Function Tests: Recent Labs    10/24/21 0511  TSH 1.591   Anemia Panel: No results for input(s): "VITAMINB12", "FOLATE", "FERRITIN", "TIBC", "IRON", "RETICCTPCT" in the last 72 hours. Urine analysis:    Component Value Date/Time   COLORURINE YELLOW 10/23/2021 1929   APPEARANCEUR HAZY (A) 10/23/2021 1929   LABSPEC 1.001 (L) 10/23/2021 1929   PHURINE 6.0 10/23/2021 1929   GLUCOSEU >=500 (A) 10/23/2021 1929   GLUCOSEU >=1000 (A) 11/17/2020 1442   HGBUR SMALL (A) 10/23/2021 1929   BILIRUBINUR NEGATIVE 10/23/2021 1929   BILIRUBINUR neg 08/01/2019 0930   KETONESUR NEGATIVE 10/23/2021 1929   PROTEINUR 100 (A) 10/23/2021 1929   UROBILINOGEN 0.2 11/17/2020 1442   NITRITE NEGATIVE 10/23/2021 1929   LEUKOCYTESUR NEGATIVE 10/23/2021 1929   Sepsis Labs: '@LABRCNTIP'$ (procalcitonin:4,lacticidven:4)  ) Recent Results (from the past 240 hour(s))  Resp Panel by RT-PCR  (Flu A&B, Covid) Anterior Nasal Swab     Status: None   Collection Time: 10/23/21  7:29 PM   Specimen: Anterior Nasal Swab  Result Value Ref Range Status   SARS Coronavirus 2 by RT PCR NEGATIVE NEGATIVE Final    Comment: (NOTE) SARS-CoV-2 target nucleic acids are NOT DETECTED.  The SARS-CoV-2 RNA is generally detectable in upper respiratory specimens during the acute phase of infection. The lowest concentration of SARS-CoV-2 viral copies this assay can detect is 138 copies/mL. A negative result does not preclude SARS-Cov-2 infection and should not be used as the sole basis for treatment or other patient management decisions. A negative result may occur with  improper specimen collection/handling, submission of specimen other than nasopharyngeal swab, presence of viral mutation(s) within the areas targeted by this assay, and inadequate number of viral copies(<138 copies/mL). A negative result must be combined with clinical observations, patient history, and  epidemiological information. The expected result is Negative.  Fact Sheet for Patients:  EntrepreneurPulse.com.au  Fact Sheet for Healthcare Providers:  IncredibleEmployment.be  This test is no t yet approved or cleared by the [place] FDA and  has been authorized for detection and/or diagnosis of SARS-CoV-2 by FDA under an Emergency Use Authorization (EUA). This EUA will remain  in effect (meaning this test can be used) for the duration of the COVID-19 declaration under Section 564(b)(1) of the Act, 21 U.S.C.section 360bbb-3(b)(1), unless the authorization is terminated  or revoked sooner.       Influenza A by PCR NEGATIVE NEGATIVE Final   Influenza B by PCR NEGATIVE NEGATIVE Final    Comment: (NOTE) The Xpert Xpress SARS-CoV-2/FLU/RSV plus assay is intended as an aid in the diagnosis of influenza from Nasopharyngeal swab specimens and should not be used as a sole basis for  treatment. Nasal washings and aspirates are unacceptable for Xpert Xpress SARS-CoV-2/FLU/RSV testing.  Fact Sheet for Patients: EntrepreneurPulse.com.au  Fact Sheet for Healthcare Providers: IncredibleEmployment.be  This test is not yet approved or cleared by the [place] FDA and has been authorized for detection and/or diagnosis of SARS-CoV-2 by FDA under an Emergency Use Authorization (EUA). This EUA will remain in effect (meaning this test can be used) for the duration of the COVID-19 declaration under Section 564(b)(1) of the Act, 21 U.S.C. section 360bbb-3(b)(1), unless the authorization is terminated or revoked.  Performed at [facility], [place] [address] [place], [place] [address]          Radiology Studies: ECHOCARDIOGRAM COMPLETE  Result Date: 10/24/2021    ECHOCARDIOGRAM REPORT   Patient Name:   Sarah [name] Date of Exam: 10/24/2021 Medical Rec #:  [id number]            Height:       60.0 in Accession #:    [id number]           Weight:       121.0 lb Date of Birth:  07-31-1945            BSA:          1.508 m Patient Age:    80 years             BP:           177/77 mmHg Patient Gender: F                    HR:           74 bpm. Exam Location:  Inpatient Procedure: 2D Echo, Cardiac Doppler and Color Doppler Indications:    Stroke  History:        Patient has prior history of Echocardiogram examinations, most                 recent 04/27/2021. Pacemaker; Risk Factors:Diabetes,                 Hypertension, Dyslipidemia and Former Smoker. CKD. GERD.  Sonographer:    Clayton Lefort RDCS (AE) Referring Phys: Alferd Patee [facility] IMPRESSIONS  1. Left ventricular ejection fraction, by estimation, is 60 to 65%. The left ventricle has normal function. The left ventricle has no regional wall motion abnormalities. There is moderate left ventricular hypertrophy. Left ventricular diastolic parameters are consistent with Grade I diastolic  dysfunction (impaired relaxation).  2. Right ventricular systolic function is normal. The right ventricular size is normal. Tricuspid regurgitation signal is inadequate for assessing PA  pressure.  3. The mitral valve is grossly normal. Mild mitral valve regurgitation. No evidence of mitral stenosis.  4. The aortic valve is tricuspid. There is mild calcification of the aortic valve. There is mild thickening of the aortic valve. Aortic valve regurgitation is trivial. Aortic valve sclerosis/calcification is present, without any evidence of aortic stenosis.  5. The inferior vena cava is normal in size with greater than 50% respiratory variability, suggesting right atrial pressure of 3 mmHg. Conclusion(s)/Recommendation(s): No intracardiac source of embolism detected on this transthoracic study. Consider a transesophageal echocardiogram to exclude cardiac source of embolism if clinically indicated. Future echocardiograms should include global longitudinal strain assessment in setting of moderate LVH. FINDINGS  Left Ventricle: Left ventricular ejection fraction, by estimation, is 60 to 65%. The left ventricle has normal function. The left ventricle has no regional wall motion abnormalities. The left ventricular internal cavity size was normal in size. There is  moderate left ventricular hypertrophy. Left ventricular diastolic parameters are consistent with Grade I diastolic dysfunction (impaired relaxation). Right Ventricle: The right ventricular size is normal. No increase in right ventricular wall thickness. Right ventricular systolic function is normal. Tricuspid regurgitation signal is inadequate for assessing PA pressure. Left Atrium: Left atrial size was normal in size. Right Atrium: Right atrial size was normal in size. Pericardium: Trivial pericardial effusion is present. Mitral Valve: Chordal SAM. The mitral valve is grossly normal. Mild mitral valve regurgitation. No evidence of mitral valve stenosis. Tricuspid  Valve: The tricuspid valve is normal in structure. Tricuspid valve regurgitation is trivial. No evidence of tricuspid stenosis. Aortic Valve: The aortic valve is tricuspid. There is mild calcification of the aortic valve. There is mild thickening of the aortic valve. Aortic valve regurgitation is trivial. Aortic valve sclerosis/calcification is present, without any evidence of aortic stenosis. Aortic valve mean gradient measures 3.0 mmHg. Aortic valve peak gradient measures 5.4 mmHg. Aortic valve area, by VTI measures 2.58 cm. Pulmonic Valve: The pulmonic valve was normal in structure. Pulmonic valve regurgitation is trivial. No evidence of pulmonic stenosis. Aorta: The aortic root is normal in size and structure. Venous: The inferior vena cava is normal in size with greater than 50% respiratory variability, suggesting right atrial pressure of 3 mmHg. IAS/Shunts: No atrial level shunt detected by color flow Doppler.  LEFT VENTRICLE PLAX 2D LVIDd:         3.80 cm   Diastology LVIDs:         2.20 cm   LV e' medial:    4.35 cm/s LV PW:         1.10 cm   LV E/e' medial:  17.2 LV IVS:        1.40 cm   LV e' lateral:   6.20 cm/s LVOT diam:     1.70 cm   LV E/e' lateral: 12.1 LV SV:         59 LV SV Index:   39 LVOT Area:     2.27 cm  RIGHT VENTRICLE             IVC RV Basal diam:  1.90 cm     IVC diam: 1.60 cm RV S prime:     14.50 cm/s TAPSE (M-mode): 1.9 cm LEFT ATRIUM             Index        RIGHT ATRIUM          Index LA diam:        3.40 cm 2.25 cm/m  RA Area:     8.57 cm LA Vol (A2C):   46.3 ml 30.71 ml/m  RA Volume:   14.60 ml 9.68 ml/m LA Vol (A4C):   36.3 ml 24.07 ml/m LA Biplane Vol: 41.7 ml 27.66 ml/m  AORTIC VALVE AV Area (Vmax):    2.52 cm AV Area (Vmean):   2.54 cm AV Area (VTI):     2.58 cm AV Vmax:           116.00 cm/s AV Vmean:          77.700 cm/s AV VTI:            0.228 m AV Peak Grad:      5.4 mmHg AV Mean Grad:      3.0 mmHg LVOT Vmax:         129.00 cm/s LVOT Vmean:        87.000 cm/s  LVOT VTI:          0.259 m LVOT/AV VTI ratio: 1.14  AORTA Ao Root diam: 3.10 cm Ao Asc diam:  3.00 cm MITRAL VALVE MV Area (PHT): 2.93 cm     SHUNTS MV Decel Time: 259 msec     Systemic VTI:  0.26 m MV E velocity: 74.90 cm/s   Systemic Diam: 1.70 cm MV A velocity: 103.00 cm/s MV E/A ratio:  0.73 Cherlynn Kaiser MD Electronically signed by Cherlynn Kaiser MD Signature Date/Time: 10/24/2021/11:29:16 AM    Final    MR ANGIO HEAD WO CONTRAST  Result Date: 10/24/2021 CLINICAL DATA:  Acute stroke suspected EXAM: MRA HEAD WITHOUT CONTRAST TECHNIQUE: Angiographic images of the Circle of Willis were acquired using MRA technique without intravenous contrast. COMPARISON:  Brain MRI earlier today and intracranial MRA 03/09/2019 FINDINGS: Anterior circulation: A few slices are affected by motion artifact, including at the level of the bilateral MCA. There is a bilateral M1 stenosis, at least moderate on the left and advanced on the right, accentuated by motion but chronic when compared to prior MRA. No evidence of aneurysm Posterior circulation: The vertebral and basilar arteries are smoothly contoured and widely patent. Mild atheromatous irregularity of the posterior cerebral arteries. IMPRESSION: 1. No emergent finding. Accounting for motion artifact, no convincing change from 2021. 2. Bilateral M1 segment stenosis, particularly advanced on the right where there has been prior MCA territory infarct. Electronically Signed   By: Jorje Guild M.D.   On: 10/24/2021 10:27   MR BRAIN WO CONTRAST  Result Date: 10/24/2021 CLINICAL DATA:  Altered mental status EXAM: MRI HEAD WITHOUT CONTRAST TECHNIQUE: Multiplanar, multiecho pulse sequences of the brain and surrounding structures were obtained without intravenous contrast. COMPARISON:  03/09/2019 FINDINGS: Brain: Punctate acute infarcts of the left cerebellum and left parietal lobe. 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 No acute hemorrhage. There is multifocal hyperintense T2-weighted signal within the white matter. Generalized volume loss. Old posterior right MCA territory infarct and punctate old right cerebellar infarct. The midline structures are normal. Vascular: Major flow voids are preserved. Skull and upper cervical spine: Normal calvarium and skull base. Visualized upper cervical spine and soft tissues are normal. Sinuses/Orbits:No paranasal sinus fluid levels or advanced mucosal thickening. No mastoid or middle ear effusion. Normal orbits. IMPRESSION: 1. Punctate acute infarcts of the left cerebellum and left parietal lobe. No hemorrhage or mass effect. 2. Old posterior right MCA territory infarct and punctate old right cerebellar infarct. Electronically Signed   By: Cletus Gash.D.  On: 10/24/2021 03:26   DG Chest Port 1 View  Result Date: 10/23/2021 CLINICAL DATA:  Chest pain. EXAM: PORTABLE CHEST 1 VIEW COMPARISON:  Radiograph 10/16/2016 FINDINGS: The cardiomediastinal contours are normal. Loop recorder in the left chest wall. Pulmonary vasculature is normal. No consolidation, pleural effusion, or pneumothorax. No acute osseous abnormalities are seen. IMPRESSION: No acute chest findings. Electronically Signed   By: Keith Rake M.D.   On: 10/23/2021 23:32   CT Head Wo Contrast  Result Date: 10/23/2021 CLINICAL DATA:  Transient ischemic attack. Frontal headaches for 2 days. EXAM: CT HEAD WITHOUT CONTRAST TECHNIQUE: Contiguous axial images were obtained from the base of the skull through the vertex without intravenous contrast. RADIATION DOSE REDUCTION: This exam was performed according to the departmental dose-optimization program which includes automated exposure control, adjustment of the mA and/or kV according to patient size and/or use of iterative reconstruction technique. COMPARISON:  MRI 03/09/2019.  CT 02/08/2018 FINDINGS: Brain: There is a large area of encephalomalacia involving  the right frontal, temporal, and parietal regions. This is consistent with infarct. The infarct has progressed since prior MRI and CT studies. No mass-effect or midline shift. No abnormal extra-axial fluid collections. Basal cisterns are not effaced. Mild cerebral atrophy. Ventricular dilatation consistent with central atrophy. Low-attenuation changes in the deep white matter consistent small vessel ischemic change. No acute intracranial hemorrhage. Vascular: Intracranial arterial calcifications. Skull: Calvarium appears intact. Sinuses/Orbits: Paranasal sinuses and mastoid air cells are clear. Other: None. IMPRESSION: 1. 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 2. No acute intracranial hemorrhage or mass effect. 3. Chronic atrophy and small vessel ischemic changes. Electronically Signed   By: Lucienne Capers M.D.   On: 10/23/2021 20:46        Scheduled Meds:  [START ON 10/25/2021]  stroke: early stages of recovery book   Does not apply Once   aspirin  324 mg Oral Once   atorvastatin  80 mg Oral Daily   clopidogrel  75 mg Oral Daily   ezetimibe  10 mg Oral Daily   heparin  5,000 Units Subcutaneous Q8H   insulin glargine-yfgn  80 Units Subcutaneous Q1500   Continuous Infusions:  sodium chloride 75 mL/hr at 10/24/21 0502     LOS: 0 days    Time spent: 55 minutes.    Dana Allan, MD  [facility] Pager #: [phone] 7PM-7AM contact night coverage as above

## 2021-10-24 NOTE — H&P (Addendum)
History and Physical    Sarah [name] IOX:[id number] DOB: 11-25-1945 DOA: 10/23/2021  PCP: [name], [name], MD  Patient coming from: home  I have personally briefly reviewed patient's old medical records in [place]  Chief Complaint: change in mental status   HPI: Sarah [name] is a 76 y.o. female with medical history significant of type 2 DM, HTN, stroke, CKD II, CAD  s/p stent  1 year ago, presents to ED with change in MS.  Patient states she does not remember the event from earlier today. However she states she was told she was not  acting like her self and was confused. She states currently she feel back to her baseline. She does however note episode of chills fatigue around 4 days ago but note that they resolved within 24 hours. She then noted HA the next day which she states has been intermittent since then. She notes HA is frontal. She notes no associated n/v/vision loss/ but does endorse episodes of dizziness. She currently denies sob/ chest pain / n/v/d/abdominal pain.  On note patient does have interim history of fall around a week ago after she tripped and fell while trying to move garbage pan with complaints of residual back pain for which she saw pcp and was placed on tramadol.   ED Course:  IN ED patient work notable for elevated CE 230  with EKG noting.  Patient also noted to have [place] that noted extension of old CVA form prior [facility] one year ago.  Cardiology was consulted who solely recommended ASA  in setting of possible acute CVA. Neurology consulted and recommended further imaging but at this juncture believe findings are chronic.  Per neurology no need for cva work up if MRI negative   Vitals: Afeb, bp 169/84, hr 94, rr 18 , sat 99%  UA:+glu,+protein Respiratory pane: neg Wbc 11.5,hgb 13, plt 415 Na140, K 3.0  cr 1.15 (0.7) QA834,196 EKG:nsr 93 non-specific st -change, EKG 2 notes more prominent twave changes. Cxr: NAD CTH IMPRESSION: 1. Large  infarct in the distribution of the right middle cerebral artery, demonstrating progression since prior imaging from 2020 and 2021. 2. No acute intracranial hemorrhage or mass effect. 3. Chronic atrophy and small vessel ischemic changes. Review of Systems: As per HPI otherwise 10 point review of systems negative.   Past Medical History:  Diagnosis Date   Bilateral carpal tunnel syndrome 03/27/2019   Boils    Glaucoma    Heart murmur    HTN (hypertension)    Hx of adenomatous colonic polyps    Hyperlipidemia    Osteoporosis    Pneumonia    Stroke ([place])    Type II or unspecified type diabetes mellitus without mention of complication, uncontrolled     Past Surgical History:  Procedure Laterality Date   ABDOMINAL HYSTERECTOMY     CARPAL TUNNEL RELEASE     right   COLONOSCOPY  12-10-10   per Dr. Deatra Ina, clear, repeat in 7 yrs    CORONARY STENT INTERVENTION N/A 03/11/2020   Procedure: CORONARY STENT INTERVENTION;  Surgeon: Jettie Booze, MD;  Location: [place] [facility];  Service: Cardiovascular;  Laterality: N/A;   INCISION AND DRAINAGE PERIRECTAL ABSCESS N/A 09/26/2015   Procedure: IRRIGATION AND DEBRIDEMENT PERIRECTAL ABSCESS;  Surgeon: Mickeal Skinner, MD;  Location: [place];  Service: General;  Laterality: N/A;   INTRAVASCULAR ULTRASOUND/IVUS N/A 03/11/2020   Procedure: Intravascular Ultrasound/IVUS;  Surgeon: Jettie Booze, MD;  Location: [place]  CV [facility];  Service: Cardiovascular;  Laterality: N/A;   KNEE ARTHROSCOPY     right   LEFT HEART CATH AND CORONARY ANGIOGRAPHY N/A 03/11/2020   Procedure: LEFT HEART CATH AND CORONARY ANGIOGRAPHY;  Surgeon: Jettie Booze, MD;  Location: [place] [facility];  Service: Cardiovascular;  Laterality: N/A;   LOOP RECORDER INSERTION N/A 10/19/2016   Procedure: LOOP RECORDER INSERTION;  Surgeon: Thompson Grayer, MD;  Location: [place] [facility];  Service: Cardiovascular;  Laterality: N/A;   ORIF ANKLE FRACTURE Right 03/24/2012    Procedure: OPEN REDUCTION INTERNAL FIXATION (ORIF) ANKLE FRACTURE;  Surgeon: Newt Minion, MD;  Location: [place];  Service: Orthopedics;  Laterality: Right;  Open Reduction Internal Fixation Right Bimalleolar ankle fracture   POLYPECTOMY     TEE WITHOUT CARDIOVERSION N/A 10/18/2016   Procedure: TRANSESOPHAGEAL ECHOCARDIOGRAM (TEE);  Surgeon: Fay Records, MD;  Location: [place];  Service: Cardiovascular;  Laterality: N/A;   TONSILLECTOMY       reports that she quit smoking about 5 years ago. Her smoking use included cigarettes. She has never used smokeless tobacco. She reports current alcohol use. She reports that she does not use drugs.  No Known Allergies  Family History  Problem Relation Age of Onset   Diabetes Mother    Hypertension Mother    Stroke Father    Stroke Maternal Uncle    Stroke Paternal Uncle    Colon cancer Neg Hx    Esophageal cancer Neg Hx    Rectal cancer Neg Hx    Stomach cancer Neg Hx    Inflammatory bowel disease Neg Hx    Liver disease Neg Hx    Pancreatic cancer Neg Hx     Prior to Admission medications   Medication Sig Start Date End Date Taking? Authorizing Provider  amLODipine (NORVASC) 5 MG tablet Take 1 tablet (5 mg total) by mouth daily. 10/07/21   Imogene Burn, PA-C  atorvastatin (LIPITOR) 80 MG tablet TAKE 1 TABLET(80 MG) BY MOUTH DAILY AT 6 PM 05/18/21   [name], [name], MD  Blood Glucose Calibration (OT ULTRA/FASTTK CNTRL SOLN) SOLN  07/16/17   [provider]  clopidogrel (PLAVIX) 75 MG tablet TAKE 1 TABLET(75 MG) BY MOUTH DAILY 12/15/20   [name], [name], MD  Continuous Blood Gluc Sensor (DEXCOM G6 SENSOR) MISC 1 Device by Does not apply route as directed. 03/03/20   Shamleffer, Melanie Crazier, MD  Continuous Blood Gluc Transmit (DEXCOM G6 TRANSMITTER) MISC 1 Device by Does not apply route as directed. 03/03/20   Shamleffer, Melanie Crazier, MD  dapagliflozin propanediol (FARXIGA) 10 MG TABS tablet Take 1 tablet (10 mg total) by  mouth daily. 05/01/21   Shamleffer, Melanie Crazier, MD  ezetimibe (ZETIA) 10 MG tablet TAKE 1 TABLET(10 MG) BY MOUTH DAILY 07/07/20   [name], [name], MD  furosemide (LASIX) 20 MG tablet TAKE 1 TABLET(20 MG) BY MOUTH DAILY Patient taking differently: 40 mg. 05/30/20   [name], [name], MD  gabapentin (NEURONTIN) 100 MG capsule Take 1-2 capsules (100-200 mg total) by mouth at bedtime. 10/20/21 11/19/21  [name], [name], MD  hydrALAZINE (APRESOLINE) 10 MG tablet Take 1 tablet (10 mg total) by mouth 3 (three) times daily. 10/07/21   Imogene Burn, PA-C  insulin glargine, 1 Unit Dial, (TOUJEO SOLOSTAR) 300 UNIT/ML Solostar Pen Inject 80 Units into the skin daily in the afternoon. 08/31/21   Shamleffer, Melanie Crazier, MD  insulin lispro (HUMALOG KWIKPEN) 200 UNIT/ML KwikPen Max daily 50  units Patient taking differently: Inject 14 Units into the skin 3 (three) times daily with meals. 05/01/21   Shamleffer, Melanie Crazier, MD  Insulin Pen Needle 32G X 4 MM MISC 1 Device by Does not apply route in the morning, at noon, in the evening, and at bedtime. 05/01/21   Shamleffer, Melanie Crazier, MD  Lancet Devices (SIMPLE DIAGNOSTICS LANCING DEV) MISC Check BS before and 2 hours after meals. 09/26/17   [name], [name], MD  latanoprost (XALATAN) 0.005 % ophthalmic solution Place 1 drop into both eyes at bedtime.    [provider]  liraglutide (VICTOZA) 18 MG/3ML SOPN Inject 1.8 mg into the skin daily. 05/01/21   Shamleffer, Melanie Crazier, MD  losartan (COZAAR) 100 MG tablet Take 100 mg by mouth daily.    [provider]  losartan (COZAAR) 50 MG tablet TAKE 1 TABLET(50 MG) BY MOUTH DAILY 01/08/20   [name], [name], MD  Magnesium 400 MG TABS Take 400 mg by mouth daily.    [provider]  metoprolol tartrate (LOPRESSOR) 25 MG tablet Take 1 tablet (25 mg total) by mouth 2 (two) times daily. Patient taking differently: Take 25 mg by mouth daily. 09/22/20 06/15/21  Chandrasekhar, Lyda Kalata A, MD   Na Sulfate-K Sulfate-Mg Sulf (SUPREP BOWEL PREP KIT) 17.5-3.13-1.6 GM/177ML SOLN Take 1 kit by mouth as directed. 10/02/21   Mansouraty, Telford Nab., MD  nitroGLYCERIN (NITROSTAT) 0.4 MG SL tablet Place 1 tablet (0.4 mg total) under the tongue every 5 (five) minutes as needed for chest pain. one tab every 5 minutes up to 3 tablets total over 15 minutes. 02/29/20   Werner Lean, MD  ondansetron (ZOFRAN) 4 MG tablet Take 1 tablet (4 mg total) by mouth every 8 (eight) hours as needed for nausea or vomiting. 10/01/21   Mansouraty, Telford Nab., MD  ONE TOUCH ULTRA TEST test strip Check BS before and 2 hours after meals. 09/26/17   [name], [name], MD  sertraline (ZOLOFT) 50 MG tablet Take 1 tablet (50 mg total) by mouth daily. 11/17/20   [name], [name], MD  traMADol (ULTRAM) 50 MG tablet Take 1 tablet (50 mg total) by mouth daily as needed. 10/20/21 11/19/21  [name], [name], MD    Physical Exam: Vitals:   10/23/21 2239 10/23/21 2245 10/23/21 2300 10/24/21 0100  BP:  (!) 187/75  (!) 177/69  Pulse:  84 85 83  Resp:  '14 15 17  ' Temp:      TempSrc:      SpO2: 100% 96% 93% 98%  Weight:      Height:        Vitals:   10/23/21 2239 10/23/21 2245 10/23/21 2300 10/24/21 0100  BP:  (!) 187/75  (!) 177/69  Pulse:  84 85 83  Resp:  '14 15 17  ' Temp:      TempSrc:      SpO2: 100% 96% 93% 98%  Weight:      Height:       Constitutional: NAD, calm, comfortable Eyes: PERRL, lids and conjunctivae normal ENMT: Mucous membranes are moist. Posterior pharynx clear of any exudate or lesions.missing teeth /poor dentition.  Neck: normal, supple, no masses, no thyromegaly Respiratory: clear to auscultation bilaterally, no wheezing, no crackles. Normal respiratory effort. No accessory muscle use.  Cardiovascular: Regular rate and rhythm, no murmurs / rubs / gallops. No extremity edema. 2+ pedal pulses. No carotid bruits.  Abdomen: no tenderness, no masses palpated. No hepatosplenomegaly. Bowel sounds  positive.  Musculoskeletal:  no clubbing / cyanosis. No joint deformity upper and lower extremities. Good ROM, no contractures. Normal muscle tone.  Skin: no rashes, lesions, ulcers. No induration Neurologic: CN 2-12 grossly intact. Sensation intact,  Strength 5/5 in all 4.  Psychiatric: Normal judgment and insight. Alert and oriented x 3. Normal mood.    Labs on Admission: I have personally reviewed following labs and imaging studies  CBC: Recent Labs  Lab 10/23/21 1941  WBC 11.5*  NEUTROABS 8.5*  HGB 13.0  HCT 39.7  MCV 93.9  PLT 712*   Basic Metabolic Panel: Recent Labs  Lab 10/23/21 1941  NA 140  K 3.0*  CL 101  CO2 25  GLUCOSE 315*  BUN 14  CREATININE 1.15*  CALCIUM 8.5*   GFR: Estimated Creatinine Clearance: 33.4 mL/min (A) (by C-G formula based on SCr of 1.15 mg/dL (H)). Liver Function Tests: No results for input(s): "AST", "ALT", "ALKPHOS", "BILITOT", "PROT", "ALBUMIN" in the last 168 hours. No results for input(s): "LIPASE", "AMYLASE" in the last 168 hours. No results for input(s): "AMMONIA" in the last 168 hours. Coagulation Profile: No results for input(s): "INR", "PROTIME" in the last 168 hours. Cardiac Enzymes: No results for input(s): "CKTOTAL", "CKMB", "CKMBINDEX", "TROPONINI" in the last 168 hours. BNP (last 3 results) No results for input(s): "PROBNP" in the last 8760 hours. HbA1C: No results for input(s): "HGBA1C" in the last 72 hours. CBG: No results for input(s): "GLUCAP" in the last 168 hours. Lipid Profile: No results for input(s): "CHOL", "HDL", "LDLCALC", "TRIG", "CHOLHDL", "LDLDIRECT" in the last 72 hours. Thyroid Function Tests: No results for input(s): "TSH", "T4TOTAL", "FREET4", "T3FREE", "THYROIDAB" in the last 72 hours. Anemia Panel: No results for input(s): "VITAMINB12", "FOLATE", "FERRITIN", "TIBC", "IRON", "RETICCTPCT" in the last 72 hours. Urine analysis:    Component Value Date/Time   COLORURINE YELLOW 10/23/2021 1929    APPEARANCEUR HAZY (A) 10/23/2021 1929   LABSPEC 1.001 (L) 10/23/2021 1929   PHURINE 6.0 10/23/2021 1929   GLUCOSEU >=500 (A) 10/23/2021 1929   GLUCOSEU >=1000 (A) 11/17/2020 1442   HGBUR SMALL (A) 10/23/2021 1929   BILIRUBINUR NEGATIVE 10/23/2021 1929   BILIRUBINUR neg 08/01/2019 0930   KETONESUR NEGATIVE 10/23/2021 1929   PROTEINUR 100 (A) 10/23/2021 1929   UROBILINOGEN 0.2 11/17/2020 1442   NITRITE NEGATIVE 10/23/2021 1929   LEUKOCYTESUR NEGATIVE 10/23/2021 1929    Radiological Exams on Admission: DG Chest Port 1 View  Result Date: 10/23/2021 CLINICAL DATA:  Chest pain. EXAM: PORTABLE CHEST 1 VIEW COMPARISON:  Radiograph 10/16/2016 FINDINGS: The cardiomediastinal contours are normal. Loop recorder in the left chest wall. Pulmonary vasculature is normal. No consolidation, pleural effusion, or pneumothorax. No acute osseous abnormalities are seen. IMPRESSION: No acute chest findings. Electronically Signed   By: Keith Rake M.D.   On: 10/23/2021 23:32   CT Head Wo Contrast  Result Date: 10/23/2021 CLINICAL DATA:  Transient ischemic attack. Frontal headaches for 2 days. EXAM: CT HEAD WITHOUT CONTRAST TECHNIQUE: Contiguous axial images were obtained from the base of the skull through the vertex without intravenous contrast. RADIATION DOSE REDUCTION: This exam was performed according to the departmental dose-optimization program which includes automated exposure control, adjustment of the mA and/or kV according to patient size and/or use of iterative reconstruction technique. COMPARISON:  MRI 03/09/2019.  CT 02/08/2018 FINDINGS: Brain: There is a large area of encephalomalacia involving the right frontal, temporal, and parietal regions. This is consistent with infarct. The infarct has progressed since prior MRI and CT studies. No mass-effect or midline shift.  No abnormal extra-axial fluid collections. Basal cisterns are not effaced. Mild cerebral atrophy. Ventricular dilatation consistent  with central atrophy. Low-attenuation changes in the deep white matter consistent small vessel ischemic change. No acute intracranial hemorrhage. Vascular: Intracranial arterial calcifications. Skull: Calvarium appears intact. Sinuses/Orbits: Paranasal sinuses and mastoid air cells are clear. Other: None. IMPRESSION: 1. 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 2. No acute intracranial hemorrhage or mass effect. 3. Chronic atrophy and small vessel ischemic changes. Electronically Signed   By: Lucienne Capers M.D.   On: 10/23/2021 20:46    EKG: Independently reviewed. See above  Assessment/Plan  Acute Change in Mental Status  Possible Acute CVA  -admit to progressive care  -neuro exam non-focal , however CTH with concern for possible new cva , however per neurology this most likely is chronic change. --mri pending  -if MRI negative no further  stroke work up needed  -re change in [place] concern for possible focal sz , eeg pending -continue with asa /plavix for now  NSTEMI  - + CE  , ? Ekg changes  -asa , no heparin due to concern for possible new CVA  -echo in am  -continue cycle ce  -monitor symptoms  - await cardiology final input -continue on plavix/statin/   DMII - iss/fs   HTN Permissive HTN -hold BP medications for now    AKI on CKDII -hold all nephrotoxic medications  -ivfs  -strict I/o   FEN Hypokalemia - replete prn  -Monitor closely in setting of renal failure   DVT prophylaxis:  Lovenox  Code Status: full Family Communication: n/a Disposition Plan: patient  expected to be admitted greater than 2 midnights  Consults called: Neurology/ Cardiology  Admission status: progressive care    Clance Boll MD [facility]   If 7PM-7AM, please contact night-coverage [email or web address] Password [email or web address]  10/24/2021, 1:42 AM

## 2021-10-24 NOTE — ED Notes (Signed)
Patient transported to Vascular at this time.

## 2021-10-24 NOTE — ED Notes (Signed)
Report given to [facility] at this time.

## 2021-10-24 NOTE — Plan of Care (Signed)
Personally seen and examined. Agree with Fellow/APP above with the following comments:  Briefly 76 yo F with history of HTN and DM, HLD, with prior stroke in 2018 and associated weakness with LDL at goal and who was working on lifestyle modifications who presented with a new stroke.  Patient notes that she is feeling well. Is a bit dejected that despite treating her BP and HLD that she had another storke.  Exam notable for no murmurs, elderly female exam is largely benign from a cardiac standpoint.  Labs notable for slight stable increase in hs troponin EKG SR LAE with anterolateral TWI Tele: None An echocardiogram is pending completion - there is severe LVH with preserved EF and no WMA  Would recommend  DAPT as per primary, do not plan for heparin, this troponin elevation may be related to demand from her illness and stroke rather than a concurrent plaque rupture event - zetia 10 mg to atorvastatin 80 mg, LDL goal < 55; will need eval for PCSK9i as outpatient; will do fasting lipids and LFTs tomorrow - planned for permissive HTN, restart ARB when able; historically has leg swelling if norvasc is increased to 10 mg   Rudean Haskell, MD [facility]  [id number] [place], [place] [id number] [phone]  11:32 AM

## 2021-10-24 NOTE — Progress Notes (Signed)
  Echocardiogram 2D Echocardiogram has been performed.  Sarah [name] 10/24/2021, 8:52 AM

## 2021-10-24 NOTE — Progress Notes (Signed)
VASCULAR LAB    Carotid duplex has been performed.  See CV proc for preliminary results.   Mirza Kidney, RVT 10/24/2021, 9:11 AM

## 2021-10-24 NOTE — Progress Notes (Signed)
Went to room, set up. Pt would liike to est first, will check back in 30 mins

## 2021-10-24 NOTE — Progress Notes (Signed)
EEG complete - results pending 

## 2021-10-24 NOTE — ED Notes (Signed)
Pt assisted back into bed. Placed on bedside cardiac monitor, call bell within reach. Denies any needs or pain at this time.

## 2021-10-24 NOTE — Progress Notes (Signed)
PT Cancellation Note  Patient Details Name: Sarah [name] MRN: [id number] DOB: 01/30/1945   Cancelled Treatment:    Reason Eval/Treat Not Completed: Other (comment).  In EEG and will not be able to assess, follow up another time.   Ramond Dial 10/24/2021, 3:05 PM  Mee Hives, PT PhD Acute Rehab Dept. Number: [place]

## 2021-10-24 NOTE — Consult Note (Signed)
Cardiology Consultation:   Patient ID: Sarah [name] MRN: [id number]; DOB: 20-Jun-1945  Admit date: 10/23/2021 Date of Consult: 10/24/2021  Primary Care Provider: [name], [name], MD Primary Cardiologist: Werner Lean, MD  Primary Electrophysiologist:  None    Patient Profile:   Sarah [name] is a 76 y.o. female with a hx of T2DM, HTN, CAD (PCI to RCA in 3/22), prior CVA, CKD who is being seen today for the evaluation of altered mental status and elevated troponin at the request of emergency department.  History of Present Illness:   [name] is a 76 year old female with a history of T2DM, HTN, CAD (PCI to RCA in 03/2020), prior CVA, CKD who presented to the emergency department after worsening headaches and altered mental status.  She denies any chest pain or shortness of breath.  Notes that her major symptom has been fatigue and confusion.  She lives with her mother at home who noted that her confusion and memory lapses has been worse.  She has not had any real cardiac symptoms.  Notes when she goes up a steep flight of steps she does have some dyspnea on exertion.  Also notes that she has had some gait imbalance that has been worsening.  Brought in by family due to concern.  On arrival to emergency department, she was hemodynamically stable.  Given unclear presentation, as part of the work-up troponins were drawn.  Initial troponin were 230 and repeat 268.  Creatinine up to 1.15 from prior 0.7.  Cardiology was consulted for recommendations on elevated troponin.    Past Medical History:  Diagnosis Date   Bilateral carpal tunnel syndrome 03/27/2019   Boils    Glaucoma    Heart murmur    HTN (hypertension)    Hx of adenomatous colonic polyps    Hyperlipidemia    Osteoporosis    Pneumonia    Stroke ([place])    Type II or unspecified type diabetes mellitus without mention of complication, uncontrolled     Past Surgical History:  Procedure Laterality  Date   ABDOMINAL HYSTERECTOMY     CARPAL TUNNEL RELEASE     right   COLONOSCOPY  12-10-10   per Dr. Deatra Ina, clear, repeat in 7 yrs    CORONARY STENT INTERVENTION N/A 03/11/2020   Procedure: CORONARY STENT INTERVENTION;  Surgeon: Jettie Booze, MD;  Location: [facility];  Service: Cardiovascular;  Laterality: N/A;   INCISION AND DRAINAGE PERIRECTAL ABSCESS N/A 09/26/2015   Procedure: IRRIGATION AND DEBRIDEMENT PERIRECTAL ABSCESS;  Surgeon: Mickeal Skinner, MD;  Location: [facility];  Service: General;  Laterality: N/A;   INTRAVASCULAR ULTRASOUND/IVUS N/A 03/11/2020   Procedure: Intravascular Ultrasound/IVUS;  Surgeon: Jettie Booze, MD;  Location: [facility];  Service: Cardiovascular;  Laterality: N/A;   KNEE ARTHROSCOPY     right   LEFT HEART CATH AND CORONARY ANGIOGRAPHY N/A 03/11/2020   Procedure: LEFT HEART CATH AND CORONARY ANGIOGRAPHY;  Surgeon: Jettie Booze, MD;  Location: [facility];  Service: Cardiovascular;  Laterality: N/A;   LOOP RECORDER INSERTION N/A 10/19/2016   Procedure: LOOP RECORDER INSERTION;  Surgeon: Thompson Grayer, MD;  Location: [facility];  Service: Cardiovascular;  Laterality: N/A;   ORIF ANKLE FRACTURE Right 03/24/2012   Procedure: OPEN REDUCTION INTERNAL FIXATION (ORIF) ANKLE FRACTURE;  Surgeon: Newt Minion, MD;  Location: [facility];  Service: Orthopedics;  Laterality: Right;  Open Reduction Internal Fixation Right Bimalleolar ankle fracture   POLYPECTOMY  TEE WITHOUT CARDIOVERSION N/A 10/18/2016   Procedure: TRANSESOPHAGEAL ECHOCARDIOGRAM (TEE);  Surgeon: Fay Records, MD;  Location: [facility];  Service: Cardiovascular;  Laterality: N/A;   TONSILLECTOMY       Home Medications:  Prior to Admission medications   Medication Sig Start Date End Date Taking? Authorizing Provider  amLODipine (NORVASC) 5 MG tablet Take 1 tablet (5 mg total) by mouth daily. 10/07/21   Imogene Burn, PA-C  atorvastatin (LIPITOR) 80 MG  tablet TAKE 1 TABLET(80 MG) BY MOUTH DAILY AT 6 PM 05/18/21   [name], [name], MD  Blood Glucose Calibration (OT ULTRA/FASTTK CNTRL SOLN) SOLN  07/16/17   [provider]  clopidogrel (PLAVIX) 75 MG tablet TAKE 1 TABLET(75 MG) BY MOUTH DAILY 12/15/20   [name], [name], MD  Continuous Blood Gluc Sensor (DEXCOM G6 SENSOR) MISC 1 Device by Does not apply route as directed. 03/03/20   Shamleffer, Melanie Crazier, MD  Continuous Blood Gluc Transmit (DEXCOM G6 TRANSMITTER) MISC 1 Device by Does not apply route as directed. 03/03/20   Shamleffer, Melanie Crazier, MD  dapagliflozin propanediol (FARXIGA) 10 MG TABS tablet Take 1 tablet (10 mg total) by mouth daily. 05/01/21   Shamleffer, Melanie Crazier, MD  ezetimibe (ZETIA) 10 MG tablet TAKE 1 TABLET(10 MG) BY MOUTH DAILY 07/07/20   [name], [name], MD  furosemide (LASIX) 20 MG tablet TAKE 1 TABLET(20 MG) BY MOUTH DAILY Patient taking differently: 40 mg. 05/30/20   [name], [name], MD  gabapentin (NEURONTIN) 100 MG capsule Take 1-2 capsules (100-200 mg total) by mouth at bedtime. 10/20/21 11/19/21  [name], [name], MD  hydrALAZINE (APRESOLINE) 10 MG tablet Take 1 tablet (10 mg total) by mouth 3 (three) times daily. 10/07/21   Imogene Burn, PA-C  insulin glargine, 1 Unit Dial, (TOUJEO SOLOSTAR) 300 UNIT/ML Solostar Pen Inject 80 Units into the skin daily in the afternoon. 08/31/21   Shamleffer, Melanie Crazier, MD  insulin lispro (HUMALOG KWIKPEN) 200 UNIT/ML KwikPen Max daily 50 units Patient taking differently: Inject 14 Units into the skin 3 (three) times daily with meals. 05/01/21   Shamleffer, Melanie Crazier, MD  Insulin Pen Needle 32G X 4 MM MISC 1 Device by Does not apply route in the morning, at noon, in the evening, and at bedtime. 05/01/21   Shamleffer, Melanie Crazier, MD  Lancet Devices (SIMPLE DIAGNOSTICS LANCING DEV) MISC Check BS before and 2 hours after meals. 09/26/17   [name], [name], MD  latanoprost (XALATAN) 0.005 % ophthalmic solution  Place 1 drop into both eyes at bedtime.    [provider]  liraglutide (VICTOZA) 18 MG/3ML SOPN Inject 1.8 mg into the skin daily. 05/01/21   Shamleffer, Melanie Crazier, MD  losartan (COZAAR) 100 MG tablet Take 100 mg by mouth daily.    [provider]  losartan (COZAAR) 50 MG tablet TAKE 1 TABLET(50 MG) BY MOUTH DAILY 01/08/20   [name], [name], MD  Magnesium 400 MG TABS Take 400 mg by mouth daily.    [provider]  metoprolol tartrate (LOPRESSOR) 25 MG tablet Take 1 tablet (25 mg total) by mouth 2 (two) times daily. Patient taking differently: Take 25 mg by mouth daily. 09/22/20 06/15/21  Chandrasekhar, Lyda Kalata A, MD  Na Sulfate-K Sulfate-Mg Sulf (SUPREP BOWEL PREP KIT) 17.5-3.13-1.6 GM/177ML SOLN Take 1 kit by mouth as directed. 10/02/21   Mansouraty, Telford Nab., MD  nitroGLYCERIN (NITROSTAT) 0.4 MG SL tablet Place 1 tablet (0.4 mg total) under the tongue every 5 (five)  minutes as needed for chest pain. one tab every 5 minutes up to 3 tablets total over 15 minutes. 02/29/20   Werner Lean, MD  ondansetron (ZOFRAN) 4 MG tablet Take 1 tablet (4 mg total) by mouth every 8 (eight) hours as needed for nausea or vomiting. 10/01/21   Mansouraty, Telford Nab., MD  ONE TOUCH ULTRA TEST test strip Check BS before and 2 hours after meals. 09/26/17   [name], [name], MD  sertraline (ZOLOFT) 50 MG tablet Take 1 tablet (50 mg total) by mouth daily. 11/17/20   [name], [name], MD  traMADol (ULTRAM) 50 MG tablet Take 1 tablet (50 mg total) by mouth daily as needed. 10/20/21 11/19/21  [name], [name], MD    Inpatient Medications: Scheduled Meds:  aspirin  324 mg Oral Once   Continuous Infusions:  PRN Meds:   Allergies:   No Known Allergies  Social History:   Social History   Socioeconomic History   Marital status: Single    Spouse name: Not on file   Number of children: Not on file   Years of education: Not on file   Highest education level: Not on file   Occupational History   Not on file  Tobacco Use   Smoking status: Former    Types: Cigarettes    Quit date: 10/15/2016    Years since quitting: 5.0   Smokeless tobacco: Never   Tobacco comments:    smokes occ.   Vaping Use   Vaping Use: Never used  Substance and Sexual Activity   Alcohol use: Yes    Alcohol/week: 0.0 standard drinks of alcohol    Comment: occ   Drug use: No   Sexual activity: Not on file  Other Topics Concern   Not on file  Social History Narrative   Not on file   Social Determinants of Health   Financial Resource Strain: Low Risk  (04/29/2021)   Overall Financial Resource Strain (CARDIA)    Difficulty of Paying Living Expenses: Not hard at all  Food Insecurity: No Food Insecurity (04/29/2021)   Hunger Vital Sign    Worried About Running Out of Food in the Last Year: Never true    Ran Out of Food in the Last Year: Never true  Transportation Needs: Unmet Transportation Needs (10/07/2021)   PRAPARE - Hydrologist (Medical): Yes    Lack of Transportation (Non-Medical): No  Physical Activity: Insufficiently Active (04/29/2021)   Exercise Vital Sign    Days of Exercise per Week: 1 day    Minutes of Exercise per Session: 30 min  Stress: No Stress Concern Present (04/29/2021)   [place]    Feeling of Stress : Not at all  Social Connections: Socially Isolated (04/29/2021)   Social Connection and Isolation Panel [NHANES]    Frequency of Communication with Friends and Family: More than three times a week    Frequency of Social Gatherings with Friends and Family: More than three times a week    Attends Religious Services: Never    Marine scientist or Organizations: No    Attends Archivist Meetings: Never    Marital Status: Never married  Intimate Partner Violence: Not At Risk (04/29/2021)   Humiliation, Afraid, Rape, and Kick questionnaire    Fear of  Current or Ex-Partner: No    Emotionally Abused: No    Physically Abused: No    Sexually Abused: No  Family History:    Family History  Problem Relation Age of Onset   Diabetes Mother    Hypertension Mother    Stroke Father    Stroke Maternal Uncle    Stroke Paternal Uncle    Colon cancer Neg Hx    Esophageal cancer Neg Hx    Rectal cancer Neg Hx    Stomach cancer Neg Hx    Inflammatory bowel disease Neg Hx    Liver disease Neg Hx    Pancreatic cancer Neg Hx      Review of Systems: [y] = yes, _0  = no    General: Weight gain _1 ; Weight loss _2 ; Anorexia _3 ; Fatigue [Y]; Fever _4 ; Chills _5 ; Weakness [Y]  Cardiac: Chest pain/pressure _6 ; Resting SOB _7 ; Exertional SOB _8 ; Orthopnea _9 ; Pedal Edema _10 ; Palpitations _11 ; Syncope _12 ; Presyncope _13 ; Paroxysmal nocturnal dyspnea_14   Pulmonary: Cough _15 ; Wheezing_16 ; Hemoptysis_17 ; Sputum _18 ; Snoring _19   GI: Vomiting_20 ; Dysphagia_21 ; Melena_22 ; Hematochezia _23 ; Heartburn_24 ; Abdominal pain _25 ; Constipation _26 ; Diarrhea _27 ; BRBPR _28   GU: Hematuria_29 ; Dysuria _30 ; Nocturia_31   Vascular: Pain in legs with walking _32 ; Pain in feet with lying flat _33 ; Non-healing sores _34 ; Stroke _35 ; TIA _36 ; Slurred speech _37 ;  Neuro: Headaches[Y]; Vertigo_38 ; Seizures_39 ; Paresthesias_40 ;Blurred vision _41 ; Diplopia _42 ; Vision changes _43   Ortho/Skin: Arthritis _44 ; Joint pain _45 ; Muscle pain _46 ; Joint swelling _47 ; Back Pain _48 ; Rash _49   Psych: Depression_50 ; Anxiety_51   Heme: Bleeding problems _52 ; Clotting disorders _53 ; Anemia _54   Endocrine: Diabetes _55 ; Thyroid dysfunction_56   Physical Exam/Data:   Vitals:   10/23/21 2230 10/23/21 2239 10/23/21 2245 10/23/21 2300  BP: 104/87  (!) 187/75   Pulse: 87  84 85  Resp: _57 Temp:      TempSrc:      SpO2: 100% 100% 96% 93%  Weight:      Height:       No intake or output data in the 24 hours ending 10/24/21 0002 Filed Weights   10/23/21 1552  Weight:  54.9 kg   Body mass index is 23.64 kg/m.  General: Elderly female in no apparent distress. HEENT: Poor dentition Lymph: no adenopathy Neck: no JVD Endocrine:  No thryomegaly Vascular: Radial pulses 2+ bilaterally Cardiac:  normal S1, S2; RRR; no murmur  Lungs:  clear to auscultation bilaterally, no wheezing, rhonchi or rales  Abd: soft, nontender, no hepatomegaly  Ext: no edema Musculoskeletal:  No deformities, BUE and BLE strength normal and equal Skin: warm and dry  Neuro:  CNs 2-12 intact, no focal abnormalities noted Psych:  Normal affect   EKG:  The EKG was personally reviewed and demonstrates: Nonspecific T wave changes Telemetry:  Telemetry was personally reviewed and demonstrates: Sinus rhythm  Relevant CV Studies: None  Laboratory Data:  Chemistry Recent Labs  Lab 10/23/21 1941  NA 140  K 3.0*  CL 101  CO2 25  GLUCOSE 315*  BUN 14  CREATININE 1.15*  CALCIUM 8.5*  GFRNONAA 50*  ANIONGAP 14    No results for input(s): "PROT", "ALBUMIN", "AST", "ALT", "ALKPHOS", "BILITOT" in the last 168 hours. Hematology Recent Labs  Lab 10/23/21 1941  WBC 11.5*  RBC 4.23  HGB 13.0  HCT 39.7  MCV 93.9  MCH 30.7  MCHC 32.7  RDW 13.2  PLT 415*   Cardiac EnzymesNo results for input(s): "TROPONINI" in the last 168 hours. No results for input(s): "TROPIPOC" in the last 168 hours.  BNPNo results for input(s): "BNP", "PROBNP" in the last 168 hours.  DDimer No results for input(s): "DDIMER" in the last 168 hours.  Radiology/Studies:  DG Chest Port 1 View  Result Date: 10/23/2021 CLINICAL DATA:  Chest pain. EXAM: PORTABLE CHEST 1 VIEW COMPARISON:  Radiograph 10/16/2016 FINDINGS: The cardiomediastinal contours are normal. Loop recorder in the left chest wall. Pulmonary vasculature is normal. No consolidation, pleural effusion, or pneumothorax. No acute osseous abnormalities are seen. IMPRESSION: No acute chest findings. Electronically Signed   By: Keith Rake M.D.    On: 10/23/2021 23:32   CT Head Wo Contrast  Result Date: 10/23/2021 CLINICAL DATA:  Transient ischemic attack. Frontal headaches for 2 days. EXAM: CT HEAD WITHOUT CONTRAST TECHNIQUE: Contiguous axial images were obtained from the base of the skull through the vertex without intravenous contrast. RADIATION DOSE REDUCTION: This exam was performed according to the departmental dose-optimization program which includes automated exposure control, adjustment of the mA and/or kV according to patient size and/or use of iterative reconstruction technique. COMPARISON:  MRI 03/09/2019.  CT 02/08/2018 FINDINGS: Brain: There is a large area of encephalomalacia involving the right frontal, temporal, and parietal regions. This is consistent with infarct. The infarct has progressed since prior MRI and CT studies. No mass-effect or midline shift. No abnormal extra-axial fluid collections. Basal cisterns are not effaced. Mild cerebral atrophy. Ventricular dilatation consistent with central atrophy. Low-attenuation changes in the deep white matter consistent small vessel ischemic change. No acute intracranial hemorrhage. Vascular: Intracranial arterial calcifications. Skull: Calvarium appears intact. Sinuses/Orbits: Paranasal sinuses and mastoid air cells are clear. Other: None. IMPRESSION: 1. 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 2. No acute intracranial hemorrhage or mass effect. 3. Chronic atrophy and small vessel ischemic changes. Electronically Signed   By: Lucienne Capers M.D.   On: 10/23/2021 20:46    Assessment and Plan:   NSTEMI.  Does not to me appear that this is a type I event.  Troponin elevation could be in the setting of neurological event given ongoing neurological symptoms.  However she does have risk factors and prior CAD.  Given her findings on her CT imaging of a large infarct with progression since prior  imaging, I would recommend not initiating anticoagulation with heparin at this time.  Especially given that she is chest pain-free.  I would repeat an echo tomorrow for evaluation.  Monitor her symptoms closely.  Repeat ECG and troponins if she develops chest pain or worsening shortness of breath.  Recommendations: -No anticoagulation at this time -Please order an echo for tomorrow -Please check lipid panel and LP(a)      For questions or updates, please contact [place] Please consult [email or web address] for contact info under     Signed, Doyne Keel, MD  10/24/2021 12:02 AM

## 2021-10-24 NOTE — ED Notes (Signed)
Lab called to add on UDS for urine sample in lab.

## 2021-10-25 DIAGNOSIS — Z8673 Personal history of transient ischemic attack (TIA), and cerebral infarction without residual deficits: Secondary | ICD-10-CM

## 2021-10-25 DIAGNOSIS — R404 Transient alteration of awareness: Secondary | ICD-10-CM | POA: Diagnosis not present

## 2021-10-25 LAB — CBC WITH DIFFERENTIAL/PLATELET
Abs Immature Granulocytes: 0.03 10*3/uL (ref 0.00–0.07)
Basophils Absolute: 0 10*3/uL (ref 0.0–0.1)
Basophils Relative: 0 %
Eosinophils Absolute: 0 10*3/uL (ref 0.0–0.5)
Eosinophils Relative: 0 %
HCT: 34.1 % — ABNORMAL LOW (ref 36.0–46.0)
Hemoglobin: 11.2 g/dL — ABNORMAL LOW (ref 12.0–15.0)
Immature Granulocytes: 0 %
Lymphocytes Relative: 26 %
Lymphs Abs: 2.1 10*3/uL (ref 0.7–4.0)
MCH: 30.6 pg (ref 26.0–34.0)
MCHC: 32.8 g/dL (ref 30.0–36.0)
MCV: 93.2 fL (ref 80.0–100.0)
Monocytes Absolute: 0.5 10*3/uL (ref 0.1–1.0)
Monocytes Relative: 6 %
Neutro Abs: 5.3 10*3/uL (ref 1.7–7.7)
Neutrophils Relative %: 68 %
Platelets: 352 10*3/uL (ref 150–400)
RBC: 3.66 MIL/uL — ABNORMAL LOW (ref 3.87–5.11)
RDW: 13 % (ref 11.5–15.5)
WBC: 8 10*3/uL (ref 4.0–10.5)
nRBC: 0 % (ref 0.0–0.2)

## 2021-10-25 LAB — RENAL FUNCTION PANEL
Albumin: 1.6 g/dL — ABNORMAL LOW (ref 3.5–5.0)
Anion gap: 7 (ref 5–15)
BUN: 17 mg/dL (ref 8–23)
CO2: 26 mmol/L (ref 22–32)
Calcium: 8.2 mg/dL — ABNORMAL LOW (ref 8.9–10.3)
Chloride: 106 mmol/L (ref 98–111)
Creatinine, Ser: 1.01 mg/dL — ABNORMAL HIGH (ref 0.44–1.00)
GFR, Estimated: 58 mL/min — ABNORMAL LOW (ref >60)
Glucose, Bld: 88 mg/dL (ref 70–99)
Phosphorus: 3 mg/dL (ref 2.5–4.6)
Potassium: 2.6 mmol/L — CL (ref 3.5–5.1)
Sodium: 139 mmol/L (ref 135–145)

## 2021-10-25 LAB — BASIC METABOLIC PANEL
Anion gap: 9 (ref 5–15)
BUN: 17 mg/dL (ref 8–23)
CO2: 27 mmol/L (ref 22–32)
Calcium: 7.8 mg/dL — ABNORMAL LOW (ref 8.9–10.3)
Chloride: 100 mmol/L (ref 98–111)
Creatinine, Ser: 1.03 mg/dL — ABNORMAL HIGH (ref 0.44–1.00)
GFR, Estimated: 57 mL/min — ABNORMAL LOW (ref >60)
Glucose, Bld: 118 mg/dL — ABNORMAL HIGH (ref 70–99)
Potassium: 3.6 mmol/L (ref 3.5–5.1)
Sodium: 136 mmol/L (ref 135–145)

## 2021-10-25 LAB — LIPOPROTEIN A (LPA): Lipoprotein (a): 250.2 nmol/L — ABNORMAL HIGH (ref <75.0)

## 2021-10-25 LAB — GLUCOSE, CAPILLARY
Glucose-Capillary: 117 mg/dL — ABNORMAL HIGH (ref 70–99)
Glucose-Capillary: 127 mg/dL — ABNORMAL HIGH (ref 70–99)
Glucose-Capillary: 58 mg/dL — ABNORMAL LOW (ref 70–99)
Glucose-Capillary: 79 mg/dL (ref 70–99)

## 2021-10-25 LAB — TROPONIN I (HIGH SENSITIVITY): Troponin I (High Sensitivity): 166 ng/L (ref <18)

## 2021-10-25 LAB — HEMOGLOBIN A1C
Hgb A1c MFr Bld: 12.6 % — ABNORMAL HIGH (ref 4.8–5.6)
Mean Plasma Glucose: 314.92 mg/dL

## 2021-10-25 MED ORDER — LOSARTAN POTASSIUM 25 MG PO TABS
25.0000 mg | ORAL_TABLET | Freq: Every day | ORAL | Status: DC
  Administered 2021-10-25 – 2021-10-28 (×4): 25 mg via ORAL
  Filled 2021-10-25 (×4): qty 1

## 2021-10-25 MED ORDER — POTASSIUM CHLORIDE CRYS ER 20 MEQ PO TBCR
40.0000 meq | EXTENDED_RELEASE_TABLET | ORAL | Status: AC
  Administered 2021-10-25 (×2): 40 meq via ORAL
  Filled 2021-10-25 (×2): qty 2

## 2021-10-25 NOTE — Progress Notes (Addendum)
STROKE TEAM PROGRESS NOTE   INTERVAL HISTORY No family is present at the bedside during examination. Patient is sitting up at bedside recliner, states that she was able to ambulate using a walker today and feels that her gait is more steady today.   Vitals:   10/24/21 2024 10/24/21 2335 10/25/21 0442 10/25/21 0855  BP: (!) 172/74 (!) 159/69 (!) 161/67 (!) 126/110  Pulse: 86 77 78 83  Resp: '17 19 19 19  '$ Temp: 99.2 F (37.3 C) 98.5 F (36.9 C) 98.2 F (36.8 C) 98.3 F (36.8 C)  TempSrc: Oral Oral  Oral  SpO2: 100% 99% 97% 100%  Weight:   53.1 kg   Height:       CBC:  Recent Labs  Lab 10/23/21 1941 10/24/21 0511 10/25/21 0704  WBC 11.5* 11.1* 8.0  NEUTROABS 8.5*  --  5.3  HGB 13.0 11.9* 11.2*  HCT 39.7 35.2* 34.1*  MCV 93.9 92.1 93.2  PLT 415* 343 371    Basic Metabolic Panel:  Recent Labs  Lab 10/24/21 0512 10/25/21 0706  NA 136 139  K 3.3* 2.6*  CL 100 106  CO2 24 26  GLUCOSE 368* 88  BUN 16 17  CREATININE 1.02* 1.01*  CALCIUM 8.1* 8.2*  PHOS  --  3.0    Lipid Panel:  Recent Labs  Lab 10/24/21 0512  CHOL 362*  TRIG 210*  HDL 93  CHOLHDL 3.9  VLDL 42*  LDLCALC 227*    HgbA1c:  Recent Labs  Lab 10/25/21 0704  HGBA1C 12.6*   Urine Drug Screen:  Recent Labs  Lab 10/24/21 1208  LABOPIA NONE DETECTED  COCAINSCRNUR NONE DETECTED  LABBENZ NONE DETECTED  AMPHETMU NONE DETECTED  THCU NONE DETECTED  LABBARB NONE DETECTED    Alcohol Level No results for input(s): "ETH" in the last 168 hours.  IMAGING past 24 hours EEG adult  Result Date: 10/24/2021 Derek Jack, MD     10/24/2021  8:00 PM Routine EEG Report SUZANNA ZAHN is a 76 y.o. female with a history of altered mental status who is undergoing an EEG to evaluate for seizures. Report: This EEG was acquired with electrodes placed according to the International 10-20 electrode system (including Fp1, Fp2, F3, F4, C3, C4, P3, P4, O1, O2, T3, T4, T5, T6, A1, A2, Fz, Cz, Pz). The  following electrodes were missing or displaced: none. The occipital dominant rhythm was 8.5 Hz. This activity is reactive to stimulation. Drowsiness was manifested by background fragmentation; deeper stages of sleep were identified by K complexes and sleep spindles. There was no focal slowing. There were no interictal epileptiform discharges. There were no electrographic seizures identified. Photic stimulation and hyperventilation were not performed. Impression: This EEG was obtained while awake and asleep and is normal.   Clinical Correlation: Normal EEGs, however, do not rule out epilepsy. Su Monks, MD [facility] [phone] If 7pm- 7am, please page neurology on call as listed in [place].   VAS US CAROTID  Result Date: 10/24/2021 Carotid Arterial Duplex Study Patient Name:  BRINNLEY LACAP  Date of Exam:   10/24/2021 Medical Rec #: [id number]             Accession #:    [id number] Date of Birth: 08-22-1945             Patient Gender: F Patient Age:   76 years Exam Location:  [facility] Procedure:      VAS US CAROTID Referring Phys: Alferd Patee [facility] --------------------------------------------------------------------------------  Indications:       CVA. Risk Factors:      Hypertension, hyperlipidemia, Diabetes, coronary artery                    disease. Other Factors:     CKD II. Comparison Study:  Prior study done 04/24/21 indicating 1-39% ICA stenosis,                    bilaterally Performing Technologist: Sharion Dove RVS  Examination Guidelines: A complete evaluation includes B-mode imaging, spectral Doppler, color Doppler, and power Doppler as needed of all accessible portions of each vessel. Bilateral testing is considered an integral part of a complete examination. Limited examinations for reoccurring indications may be performed as noted.  Right Carotid Findings: +----------+--------+--------+--------+------------------+------------------+           PSV cm/sEDV  cm/sStenosisPlaque DescriptionComments           +----------+--------+--------+--------+------------------+------------------+ CCA Prox  96      16                                intimal thickening +----------+--------+--------+--------+------------------+------------------+ CCA Distal71      12                                intimal thickening +----------+--------+--------+--------+------------------+------------------+ ICA Prox  90      20              heterogenous                         +----------+--------+--------+--------+------------------+------------------+ ICA Mid   83      17                                                   +----------+--------+--------+--------+------------------+------------------+ ICA Distal61      19                                                   +----------+--------+--------+--------+------------------+------------------+ ECA       121     11                                                   +----------+--------+--------+--------+------------------+------------------+ +----------+--------+-------+--------+-------------------+           PSV cm/sEDV cmsDescribeArm Pressure (mmHG) +----------+--------+-------+--------+-------------------+ Subclavian114                                        +----------+--------+-------+--------+-------------------+ +---------+--------+--+--------+--+ VertebralPSV cm/s93EDV cm/s16 +---------+--------+--+--------+--+  Left Carotid Findings: +----------+--------+--------+--------+------------------+------------------+           PSV cm/sEDV cm/sStenosisPlaque DescriptionComments           +----------+--------+--------+--------+------------------+------------------+ CCA Prox  107     17  intimal thickening +----------+--------+--------+--------+------------------+------------------+ CCA Distal78      19                                intimal  thickening +----------+--------+--------+--------+------------------+------------------+ ICA Prox  86      20              heterogenous                         +----------+--------+--------+--------+------------------+------------------+ ICA Mid   90      23                                                   +----------+--------+--------+--------+------------------+------------------+ ICA Distal81      25                                                   +----------+--------+--------+--------+------------------+------------------+ ECA       127     17                                                   +----------+--------+--------+--------+------------------+------------------+ +----------+--------+--------+--------+-------------------+           PSV cm/sEDV cm/sDescribeArm Pressure (mmHG) +----------+--------+--------+--------+-------------------+ [id number]                                          +----------+--------+--------+--------+-------------------+ +---------+--------+--+--------+--+--------+ VertebralPSV cm/s56EDV cm/s14Atypical +---------+--------+--+--------+--+--------+   Summary: Right Carotid: Velocities in the right ICA are consistent with a 1-39% stenosis. Left Carotid: Velocities in the left ICA are consistent with a 1-39% stenosis. Vertebrals:  Right vertebral artery demonstrates antegrade flow. Left vertebral              exhibits atypical waveform. Subclavians: Abnormal waveforms in bilateral subclavian. *See table(s) above for measurements and observations.     Preliminary    MR ANGIO HEAD WO CONTRAST  Result Date: 10/24/2021 CLINICAL DATA:  Acute stroke suspected EXAM: MRA HEAD WITHOUT CONTRAST TECHNIQUE: Angiographic images of the Circle of Willis were acquired using MRA technique without intravenous contrast. COMPARISON:  Brain MRI earlier today and intracranial MRA 03/09/2019 FINDINGS: Anterior circulation: A few slices are affected by  motion artifact, including at the level of the bilateral MCA. There is a bilateral M1 stenosis, at least moderate on the left and advanced on the right, accentuated by motion but chronic when compared to prior MRA. No evidence of aneurysm Posterior circulation: The vertebral and basilar arteries are smoothly contoured and widely patent. Mild atheromatous irregularity of the posterior cerebral arteries. IMPRESSION: 1. No emergent finding. Accounting for motion artifact, no convincing change from 2021. 2. Bilateral M1 segment stenosis, particularly advanced on the right where there has been prior MCA territory infarct. Electronically Signed   By: Jorje Guild M.D.   On: 10/24/2021 10:27    PHYSICAL EXAM  Physical Exam  Constitutional: Appears well-developed and well-nourished.  Psych: Affect appropriate to situation, calm and cooperative with exam  Eyes: No scleral injection Respiratory: Effort normal, non-labored breathing on room air  Neuro: Mental Status: Patient is awake, alert, oriented to person, place, year, and situation.  Patient is able to give a clear and coherent history of present illness.  Speech is fluent, comprehension is intact.  No signs of aphasia or neglect. Cranial Nerves: II:  Left homonymous hemianopsia  III,IV, VI: EOMI without ptosis or diploplia.  V: Facial sensation is symmetric to light touch  VII: Left NLF flattening VIII: Hearing is intact to voice X: Palate elevates symmetrically XI: Shoulder shrug is symmetric. XII: Tongue protrudes midline  Motor: Tone is normal. Bulk is normal. 5/5 strength right upper and lower extremities.  Left upper extremity 4+/5 strength, left lower extremity 4+/5 proximal, 5/5 distal.  Sensory: Sensation is symmetric to light touch in the arms and legs. No extinction to DSS present.  Cerebellar: FNF with mild dysmetria on the left.  ASSESSMENT/PLAN Ms. LATRISH MOGEL is a 76 y.o. female with history of hypertension,  remote R MCA stroke with residual partial left homonymous hemianopsia of unclear etiology, remote tobacco use history- quit smoking in 2018, osteoporosis, CAD s/p DES to RCA 03/2020 and DM2 presenting with acute encephalopathy with memory lapses and feeling unsteady/wobbly with walking.   Stroke - Punctate left cerebellar and left parietal lobe infarctions, likely an incidental finding on MRI during encephalopathy work up, etiology not clear, cardioembolic pattern.  CT head chronic large infarct in the distribution of the right MCA  MRI punctate acute infarcts of the left cerebellum and left parietal lobe without hemorrhage or mass effect. Old posterior right MCA territory infarct and punctate old right cerebellar infarct.  MRA no emergent finding. Bilateral M1 segment stenosis, particularly advanced on the right at the location of her previous RMCA infarction.  Carotid Doppler unremarkable EEG normal  2D Echo LVEF 60-65% Loop recorder placed in the past without evidence of atrial fibrillation. Will need 30 day event monitor at outpatient.  LDL 227 HgbA1c significantly elevated at 12.6%, up from 11.7% 08/2021 VTE prophylaxis - heparin  clopidogrel 75 mg daily prior to admission, now on aspirin 81 mg daily and clopidogrel 75 mg daily for 3 weeks and then plavix alone.  Therapy recommendations:  CIR Disposition:  Pending Patient has scheduled follow up with Frann Rider on 11/04/2021 at [facility].  Encephalopathy, etiology unclear WBC elevated, improved 11.5->11.1-> 8.0, patient has remained afebrile UA negative RVP negative  CXR negative Patient mental status improved and now at baseline  History of stroke  Stroke in 10/2016 with left-sided weakness.  MRI showed right MCA scattered infarcts.  MRA showed right M2 occlusion.  CTA neck unremarkable.  Carotid Doppler right 40 to 59% stenosis.  EF 55 to 60%.  TEE unremarkable, LDL 98 and A1c 12.9.  Loop recorder placed.  Patient was discharged with DAPT  and Lipitor 40. Still in 01/2018 with left hemianopia. CT as well as MRI showed right inferior MCA infarct.  MRA head and neck showed progression of intracranial stenosis from 2018 with right distal M1 and proximal M2 high-grade stenosis as well as left M1 moderate to severe stenosis. A1c 13.5 and LDL 76. EF 60 to 65%.  Loop recorder interrogated, no A. fib found.  Discharged on DAPT and Lipitor 80. Follows with Dr. Leonie Man / Frann Rider at [facility], last seen 09/11/2020   Hypertension Home meds:  Norvasc, furosemide, hydralazine, losartan   Unstable on the high  end Gradually normalize BP in 2-3 days Long-term BP goal normotensive  Hyperlipidemia Home meds:  atorvastatin, zetia, resumed in hospital LDL significantly elevated at 227 despite multiple medications, goal < 70 Patient is on high intensity statin, will need to follow in the lipid clinic for further lipid management  Patient does endorse missing her medications occasionally to frequently  Continue statin and zetia at discharge  Diabetes type II Uncontrolled Home meds:  insulin lispro, glargine, farxiga  HgbA1c 12.6%, up from 11.7 (08/2021), goal < 7.0 CBG monitoring SSI Close PCP follow-up for better DM control  Other Stroke Risk Factors Advanced Age >/= 64  Remote Cigarette smoker; quit smoking in 2018 Family hx stroke (father, maternal uncle, paternal uncle) CAD s/p DES to RCA 03/2020   Other Active Problems CKD stage II  Elevated troponin level, cardiology on board, likely demand ischemia, continue DAPT  Hospital day # 1  -- Anibal Breed, AGACNP-BC [facility] [phone]  ATTENDING NOTE: I reviewed above note and agree with the assessment and plan. Pt was seen and examined.   No family at bedside.  Patient sitting in chair, no acute event overnight, neuro unchanged.  PT/OT has recommended CIR.  A1c come back showed 12.6, indicating poorly controlled diabetes.  Patient was intubated on diabetic management  at home.  Continue DAPT for 3 weeks and then Plavix alone, continue statin and Zetia.  Need lipid clinic referral for better cholesterol control.  Aggressive risk factor modification.  Recommend 30-day cardiac event monitor as outpatient.  For detailed assessment and plan, please refer to above/below as I have made changes wherever appropriate.   Neurology will sign off. Please call with questions. Pt will follow up with stroke clinic Frann Rider at [facility] on 11/04/2021. Thanks for the consult.   Rosalin Hawking, MD PhD Stroke Neurology 10/25/2021 2:04 PM   To contact Stroke Continuity provider, please refer to [email or web address] After hours, contact General Neurology

## 2021-10-25 NOTE — Evaluation (Signed)
Physical Therapy Evaluation Patient Details Name: Sarah [name] MRN: [id number] DOB: [birth year] Today's Date: 10/25/2021  History of Present Illness  The pt is a 76 yo female presenting 10/13 with AMS. Upon work up, pt with elevated troponin, and MRI showed punctate left cerebellum and left parietal infarcts. PMH includes: DM II, HTN, loop recorder, ORIF right ankle 2014, and CVA x2 (R MCA CVA in 2018 and R parieto-occipital CVA in 2020).   Clinical Impression  Pt in bed upon arrival of PT, agreeable to evaluation at this time. Prior to admission the pt was ambulating without use of DME but does report that MD has recommended getting a walker in the past due to instability. The pt reports she lives with her 69 yo mother, is independent with ADLs and IADLs, still driving and running errands at baseline. However, the pt did give conflicting information regarding home set-up within the session and therefore may need confirmation on PLOF when family available. The pt was able to complete initial bed mobility and sit-stand transfers without assist, but needed up to minA to correct multiple LOB with any addition of balance challenge at this time. Given prior level of independence, I feel the pt would benefit from short stint acute inpatient rehab to facilitate return to full independence.       Recommendations for follow up therapy are one component of a multi-disciplinary discharge planning process, led by the attending physician.  Recommendations may be updated based on patient status, additional functional criteria and insurance authorization.  Follow Up Recommendations Acute inpatient rehab (3hours/day)      Assistance Recommended at Discharge Frequent or constant Supervision/Assistance  Patient can return home with the following  A lot of help with walking and/or transfers;A little help with bathing/dressing/bathroom;Assistance with cooking/housework;Direct supervision/assist for  medications management;Direct supervision/assist for financial management;Assist for transportation;Help with stairs or ramp for entrance    Equipment Recommendations Rolling walker (2 wheels)  Recommendations for Other Services  Rehab consult    Functional Status Assessment Patient has had a recent decline in their functional status and demonstrates the ability to make significant improvements in function in a reasonable and predictable amount of time.     Precautions / Restrictions Precautions Precautions: Fall Restrictions Weight Bearing Restrictions: No      Mobility  Bed Mobility Overal bed mobility: Needs Assistance Bed Mobility: Supine to Sit     Supine to sit: Supervision     General bed mobility comments: increased time and effort, no assist given    Transfers Overall transfer level: Needs assistance Equipment used: None Transfers: Sit to/from Stand Sit to Stand: Min guard           General transfer comment: minG with pt leaning against bed at times, no assist given but mild instability    Ambulation/Gait Ambulation/Gait assistance: Min assist, Min guard Gait Distance (Feet): 150 Feet Assistive device: None, 1 person hand held assist Gait Pattern/deviations: Step-to pattern, Decreased step length - left, Shuffle, Staggering left, Staggering right, Narrow base of support Gait velocity: decreased Gait velocity interpretation: <1.31 ft/sec, indicative of household ambulator   General Gait Details: pt with x2 overt LOB wityh balance challenge. able to maintain stability with straight walking but gait is markedly slowed and with step-to pattern. RLE with significant external rotation.  Modified Rankin (Stroke Patients Only) Modified Rankin (Stroke Patients Only) Pre-Morbid Rankin Score: No significant disability Modified Rankin: Moderately severe disability     Balance Overall balance assessment: Needs assistance Sitting-balance support: No  upper  extremity supported, Feet supported Sitting balance-Leahy Scale: Good     Standing balance support: No upper extremity supported, During functional activity Standing balance-Leahy Scale: Fair Standing balance comment: no UE support without challenge, minA to correct LOB when challenged                 Standardized Balance Assessment Standardized Balance Assessment : Dynamic Gait Index   Dynamic Gait Index Level Surface: Mild Impairment Gait with Horizontal Head Turns: Moderate Impairment Gait with Vertical Head Turns: Moderate Impairment Gait and Pivot Turn: Moderate Impairment Step Over Obstacle: Severe Impairment Step Around Obstacles: Moderate Impairment       Pertinent Vitals/Pain Pain Assessment Pain Assessment: No/denies pain    Home Living Family/patient expects to be discharged to:: Private residence Living Arrangements: Parent (21 yo mother and 85 yo nephew) Available Help at Discharge: Family;Available PRN/intermittently Type of Home: House Home Access: Stairs to enter   Entrance Stairs-Number of Steps: 1 Alternate Level Stairs-Number of Steps: 10 Home Layout: Two level Home Equipment: Shower seat;Grab bars - tub/shower Additional Comments: pt initially states level entry to 1 story house with tub shower, when confirming information at end of session pt states 1 step to enter and then 10 stairs to her bedroom upstairs with walk-in shower.    Prior Function Prior Level of Function : Independent/Modified Independent;Driving             Mobility Comments: pt reports no use of DME but MD has recommended getting cane or walker. ADLs Comments: pt reports independence     Hand Dominance        Extremity/Trunk Assessment   Upper Extremity Assessment Upper Extremity Assessment: Defer to OT evaluation    Lower Extremity Assessment Lower Extremity Assessment: LLE deficits/detail;RLE deficits/detail RLE Deficits / Details: grossly 4-/5 to MMT,  difficulty with Heel-Shin and toe taps to small target. RLE Coordination: decreased gross motor;decreased fine motor LLE Deficits / Details: grossly 3+/5 to MMT with difficulty with Heel-Shin and toe taps to small object LLE Coordination: decreased fine motor;decreased gross motor    Cervical / Trunk Assessment Cervical / Trunk Assessment: Kyphotic  Communication   Communication: No difficulties  Cognition Arousal/Alertness: Awake/alert Behavior During Therapy: WFL for tasks assessed/performed Overall Cognitive Status: Impaired/Different from baseline Area of Impairment: Attention, Memory, Following commands, Safety/judgement, Awareness, Problem solving                   Current Attention Level: Focused Memory: Decreased recall of precautions, Decreased short-term memory Following Commands: Follows one step commands consistently Safety/Judgement: Decreased awareness of safety, Decreased awareness of deficits Awareness: Intellectual Problem Solving: Slow processing, Requires verbal cues General Comments: pt initially asleep but awakes well with stimulation. following instructions as given but poor problem solving on her own. inconsistent with home set up as pt giving different answers through session.        General Comments General comments (skin integrity, edema, etc.): VSS on RA    Exercises     Assessment/Plan    PT Assessment Patient needs continued PT services  PT Problem List Decreased strength;Decreased range of motion;Decreased activity tolerance;Decreased balance;Decreased mobility;Decreased coordination;Decreased cognition;Decreased knowledge of precautions;Decreased safety awareness       PT Treatment Interventions DME instruction;Gait training;Stair training;Functional mobility training;Therapeutic activities;Therapeutic exercise;Balance training;Neuromuscular re-education;Patient/family education    PT Goals (Current goals can be found in the Care Plan  section)  Acute Rehab PT Goals Patient Stated Goal: return to independence and living with her mother at  home PT Goal Formulation: With patient Time For Goal Achievement: 11/08/21 Potential to Achieve Goals: Good    Frequency Min 4X/week        AM-PAC PT "6 Clicks" Mobility  Outcome Measure Help needed turning from your back to your side while in a flat bed without using bedrails?: A Little Help needed moving from lying on your back to sitting on the side of a flat bed without using bedrails?: A Little Help needed moving to and from a bed to a chair (including a wheelchair)?: A Little Help needed standing up from a chair using your arms (e.g., wheelchair or bedside chair)?: A Little Help needed to walk in hospital room?: A Lot Help needed climbing 3-5 steps with a railing? : A Lot 6 Click Score: 16    End of Session Equipment Utilized During Treatment: Gait belt Activity Tolerance: Patient tolerated treatment well Patient left:  (standing at sink with OT) Nurse Communication: Mobility status PT Visit Diagnosis: Other abnormalities of gait and mobility (R26.89);Muscle weakness (generalized) (M62.81);Unsteadiness on feet (R26.81)    Time: 9794-8016 PT Time Calculation (min) (ACUTE ONLY): 23 min   Charges:   PT Evaluation $PT Eval Low Complexity: 1 Low PT Treatments $Neuromuscular Re-education: 8-22 mins        West Carbo, PT, DPT   Acute Rehabilitation Department  Sandra Cockayne 10/25/2021, 9:02 AM

## 2021-10-25 NOTE — Evaluation (Signed)
Occupational Therapy Evaluation Patient Details Name: Sarah [name] MRN: [id number] DOB: 1945-02-06 Today's Date: 10/25/2021   History of Present Illness The pt is a 76 yo female presenting 10/13 with AMS. Upon work up, pt with elevated troponin, and MRI showed punctate left cerebellum and left parietal infarcts. PMH includes: DM II, HTN, loop recorder, ORIF right ankle 2014, and CVA x2 (R MCA CVA in 2018 and R parieto-occipital CVA in 2020).   Clinical Impression   PTA, pt lives with her 74 y/o mother, reports typically Independent in all ADLs, IADLs and mobility without AD (though MD has recommended cane or walker for mobility). Pt presents now with deficits in cognition, dynamic standing balance and overall strength. Pt requires up to Min A for mobility without AD to correct balance that improved with RW trial. However, pt was noted to bump into items on L side when using RW. Pt requires Setup for UB ADLs and Min A for LB ADLs. Based on high PLOF and rehab potential, feel pt could make good progress to an independent level with AIR level therapies to maximize ADL, IADL and mobility abilities.       Recommendations for follow up therapy are one component of a multi-disciplinary discharge planning process, led by the attending physician.  Recommendations may be updated based on patient status, additional functional criteria and insurance authorization.   Follow Up Recommendations  Acute inpatient rehab (3hours/day)    Assistance Recommended at Discharge Frequent or constant Supervision/Assistance  Patient can return home with the following A little help with walking and/or transfers;A little help with bathing/dressing/bathroom;Assistance with cooking/housework;Direct supervision/assist for medications management;Direct supervision/assist for financial management;Assist for transportation;Help with stairs or ramp for entrance    Functional Status Assessment  Patient has had a recent  decline in their functional status and demonstrates the ability to make significant improvements in function in a reasonable and predictable amount of time.  Equipment Recommendations  Other (comment) (RW)    Recommendations for Other Services       Precautions / Restrictions Precautions Precautions: Fall Restrictions Weight Bearing Restrictions: No      Mobility Bed Mobility               General bed mobility comments: received after mobilizing in hall with PT    Transfers Overall transfer level: Needs assistance Equipment used: None Transfers: Sit to/from Stand Sit to Stand: Min guard                  Balance Overall balance assessment: Needs assistance Sitting-balance support: No upper extremity supported, Feet supported Sitting balance-Leahy Scale: Good     Standing balance support: No upper extremity supported, During functional activity Standing balance-Leahy Scale: Fair Standing balance comment: no UE support without challenge, minA to correct LOB when challenged                           ADL either performed or assessed with clinical judgement   ADL Overall ADL's : Needs assistance/impaired Eating/Feeding: Independent   Grooming: Standing;Min guard;Oral care;Wash/dry face;Brushing hair Grooming Details (indicate cue type and reason): cues for problem solving, assist to open containers. difficulty getting toothpaste on toothbrush Upper Body Bathing: Set up   Lower Body Bathing: Min guard;Sit to/from stand   Upper Body Dressing : Set up;Sitting   Lower Body Dressing: Minimal assistance;Sit to/from stand   Toilet Transfer: Min guard;Minimal assistance;Ambulation;Rolling walker (2 wheels)   Toileting- Clothing Manipulation and Hygiene:  Minimal assistance;Sit to/from stand;Sitting/lateral lean       Functional mobility during ADLs: Min guard;Minimal assistance;Cueing for safety;Cueing for sequencing;Rolling walker (2  wheels) General ADL Comments: Noted deficits when ambulating with PT without AD that improved with RW use though noted to bump into items on L side. Cognition impaired hindering ADL/IADL abilities     Vision Baseline Vision/History: 1 Wears glasses (reading) Ability to See in Adequate Light: 1 Impaired Patient Visual Report: Blurring of vision Vision Assessment?: Vision impaired- to be further tested in functional context Additional Comments: reports blurring of vision, only wears reading glasses. inconsistent answer if blurry vision is new or not     Perception Perception Comments: questionable L sided inattention. able to scan to see OT in room, scan to find paper towels on L side but did bump into items using RW that were on the L side   Praxis      Pertinent Vitals/Pain Pain Assessment Pain Assessment: No/denies pain     Hand Dominance Right   Extremity/Trunk Assessment Upper Extremity Assessment Upper Extremity Assessment: Generalized weakness (L UE slightly weaker than R UE)   Lower Extremity Assessment Lower Extremity Assessment: Defer to PT evaluation RLE Deficits / Details: grossly 4-/5 to MMT, difficulty with Heel-Shin and toe taps to small target. RLE Coordination: decreased gross motor;decreased fine motor LLE Deficits / Details: grossly 3+/5 to MMT with difficulty with Heel-Shin and toe taps to small object LLE Coordination: decreased fine motor;decreased gross motor   Cervical / Trunk Assessment Cervical / Trunk Assessment: Kyphotic   Communication Communication Communication: No difficulties   Cognition Arousal/Alertness: Awake/alert Behavior During Therapy: WFL for tasks assessed/performed Overall Cognitive Status: Impaired/Different from baseline Area of Impairment: Attention, Memory, Following commands, Safety/judgement, Awareness, Problem solving, Orientation                 Orientation Level: Disoriented to, Time Current Attention Level:  Sustained Memory: Decreased recall of precautions, Decreased short-term memory Following Commands: Follows one step commands consistently Safety/Judgement: Decreased awareness of safety, Decreased awareness of deficits Awareness: Intellectual Problem Solving: Slow processing, Requires verbal cues General Comments: inconsistent PLOF info given to PT and OT. follows directions well but requires cues for problem solving, noted memory deficits as pt unsure if she had breakfast or not this AM. Reports November 2023 initially but corrects to Oct     General Comments  VSS on RA    Exercises     Shoulder Instructions      Home Living Family/patient expects to be discharged to:: Private residence Living Arrangements: Parent;Other relatives (30 y/o mother, nephew) Available Help at Discharge: Family;Available PRN/intermittently Type of Home: House Home Access: Stairs to enter Entrance Stairs-Number of Steps: 1   Home Layout: Two level Alternate Level Stairs-Number of Steps: 10   Bathroom Shower/Tub: Occupational psychologist: Handicapped height     Home Equipment: Shower seat;Grab bars - tub/shower   Additional Comments: pt initially states level entry to 1 story house with tub shower, when confirming information at end of session pt states 1 step to enter and then 10 stairs to her bedroom upstairs with walk-in shower.      Prior Functioning/Environment Prior Level of Function : Independent/Modified Independent;Driving             Mobility Comments: pt reports no use of DME but MD has recommended getting cane or walker. ADLs Comments: ADLs, IADLs, med mgmt, and driving. Reports nephew can assist with driving and community errands at  DC        OT Problem List: Decreased strength;Impaired balance (sitting and/or standing);Impaired vision/perception;Decreased coordination;Decreased cognition;Decreased safety awareness;Decreased knowledge of use of DME or AE      OT  Treatment/Interventions: Self-care/ADL training;Therapeutic exercise;Energy conservation;DME and/or AE instruction;Therapeutic activities;Patient/family education    OT Goals(Current goals can be found in the care plan section) Acute Rehab OT Goals Patient Stated Goal: improve balance, have some breakfast OT Goal Formulation: With patient Time For Goal Achievement: 11/08/21 Potential to Achieve Goals: Good  OT Frequency: Min 2X/week    Co-evaluation              AM-PAC OT "6 Clicks" Daily Activity     Outcome Measure Help from another person eating meals?: None Help from another person taking care of personal grooming?: A Little Help from another person toileting, which includes using toliet, bedpan, or urinal?: A Little Help from another person bathing (including washing, rinsing, drying)?: A Little Help from another person to put on and taking off regular upper body clothing?: A Little Help from another person to put on and taking off regular lower body clothing?: A Little 6 Click Score: 19   End of Session Equipment Utilized During Treatment: Gait belt;Rolling walker (2 wheels) Nurse Communication: Mobility status  Activity Tolerance: Patient tolerated treatment well Patient left: in chair;with call bell/phone within reach;with chair alarm set  OT Visit Diagnosis: Unsteadiness on feet (R26.81);Other abnormalities of gait and mobility (R26.89);Other symptoms and signs involving cognitive function                Time: 1610-9604 OT Time Calculation (min): 21 min Charges:  OT General Charges $OT Visit: 1 Visit OT Evaluation $OT Eval Moderate Complexity: 1 Mod  Malachy Chamber, OTR/L Acute Rehab Services Office: [phone]   Layla Maw 10/25/2021, 9:42 AM

## 2021-10-25 NOTE — Progress Notes (Signed)
Inpatient Rehab Admissions Coordinator:  ? ?Per therapy recommendations,  patient was screened for CIR candidacy by Aldrick Derrig, MS, CCC-SLP. At this time, Pt. Appears to be a a potential candidate for CIR. I will place   order for rehab consult per protocol for full assessment. Please contact me any with questions. ? ?Uziel Covault, MS, CCC-SLP ?Rehab Admissions Coordinator  ?[phone] (celll) ?[phone] (office) ? ?

## 2021-10-25 NOTE — Progress Notes (Signed)
PROGRESS NOTE    ANNALYCIA [name]  POE:[id number] DOB: 19-Nov-1945 DOA: 10/23/2021 PCP: [name], [name], MD  Outpatient Specialists:     Brief Narrative:  As per H&P [name] on admission: "Sarah [name] is a 76 y.o. female with medical history significant of type 2 DM, HTN, stroke, CKD II, CAD  s/p stent  1 year ago, presents to ED with change in [place].  Patient states she does not remember the event from earlier today. However she states she was told she was not  acting like her self and was confused. She states currently she feel back to her baseline. She does however note episode of chills fatigue around 4 days ago but note that they resolved within 24 hours. She then noted HA the next day which she states has been intermittent since then. She notes HA is frontal. She notes no associated n/v/vision loss/ but does endorse episodes of dizziness. She currently denies sob/ chest pain / n/v/d/abdominal pain.  On note patient does have interim history of fall around a week ago after she tripped and fell while trying to move garbage pan with complaints of residual back pain for which she saw pcp and was placed on tramadol.     ED Course:  IN ED patient work notable for elevated CE 230  with EKG noting.  Patient also noted to have [place] that noted extension of old CVA form prior [facility] one year ago.  Cardiology was consulted who solely recommended ASA  in setting of possible acute CVA. Neurology consulted and recommended further imaging but at this juncture believe findings are chronic.  Per neurology no need for cva work up if MRI negative"  10/24/2021: Patient seen.  Confusion is improving.  MRI brain revealed punctate acute infarcts of the left cerebellum and left parietal lobe, no hemorrhage or mass effect.  Old posterior right MCA territory infarct and punctate old right cerebellar infarct were also reported.  Patient is currently on aspirin and Plavix.  10/25/2021: Patient seen alongside  patient's brother and sister-in-law.  Patient looks much better today.  Neurology input is appreciated.  Plan is for patient to complete 3 weeks of aspirin and Plavix and then continue on Plavix.  Patient will also need 30 days Holter monitoring.  Acute rehab has been recommended.  Potassium was 2.6 earlier today.  Will repeat aggressively.  Suspect patient may have an early dementing process, however, patient will need to be thoroughly evaluated when acute stroke resolves.   Assessment & Plan:   Principal Problem:   Change in mental status Active Problems:   Primary hypertension   Cerebrovascular accident (CVA) due to embolism of cerebral artery ([place])   Uncontrolled type 2 diabetes mellitus with hypoglycemia without coma ([place])   Acute CVA: -Confusion has improved significantly.   -MRI brain findings as documented above. -Continue aspirin and Plavix for 3 weeks, then Plavix alone. -Follow results of EEG. -Acute rehab -Patient will need 30-day Holter monitoring. -Neurology is directing care.   Elevated troponin: -Likely type II elevation. -Repeat troponin in the morning. -No chest pain.     DMII -Uncontrolled.   -HbA1c is greater than 12%. -Patient may need help with medication monitoring on discharge.   Hypertension: Permissive HTN -hold BP medications for now -Continue to monitor closely.      AKI:  -AKI has resolved.  A -Continue to monitor renal function and electrolytes.     FEN Hypokalemia - replete prn  -Monitor closely  -Potassium was  2.6 earlier today.  K-Dur 40 mEq p.o. every 4 hourly x2 doses.  Repeat BMP afterwards.  DVT prophylaxis: Subcutaneous heparin Code Status: Full code Family Communication:  Disposition Plan: Home eventually   Consultants:  Neurology  Procedures:  Patient is undergoing EEG  Antimicrobials:  None   Subjective: No new complaints Confusion is improving  Objective: Vitals:   10/25/21 0442 10/25/21 0855 10/25/21 1239  10/25/21 1702  BP: (!) 161/67 (!) 126/110 (!) 152/103 (!) 160/79  Pulse: 78 83 79 79  Resp: '19 19 19 20  '$ Temp: 98.2 F (36.8 C) 98.3 F (36.8 C) 98.8 F (37.1 C) 99.4 F (37.4 C)  TempSrc:  Oral Oral Oral  SpO2: 97% 100% 98% 97%  Weight: 53.1 kg     Height:        Intake/Output Summary (Last 24 hours) at 10/25/2021 1816 Last data filed at 10/25/2021 1314 Gross per 24 hour  Intake 1230 ml  Output --  Net 1230 ml   Filed Weights   10/23/21 1552 10/25/21 0442  Weight: 54.9 kg 53.1 kg    Examination:  General exam: Appears calm and comfortable  Respiratory system: Clear to auscultation. Respiratory effort normal. Cardiovascular system: S1 & S2 heard Gastrointestinal system: Abdomen is nondistended, soft and nontender. No organomegaly or masses felt. Normal bowel sounds heard. Central nervous system: Awake and alert.  Patient moves all extremities.   Extremities: Leg edema.  Data Reviewed: I have personally reviewed following labs and imaging studies  CBC: Recent Labs  Lab 10/23/21 1941 10/24/21 0511 10/25/21 0704  WBC 11.5* 11.1* 8.0  NEUTROABS 8.5*  --  5.3  HGB 13.0 11.9* 11.2*  HCT 39.7 35.2* 34.1*  MCV 93.9 92.1 93.2  PLT 415* 343 163    Basic Metabolic Panel: Recent Labs  Lab 10/23/21 1941 10/24/21 0512 10/25/21 0706 10/25/21 1620  NA 140 136 139 136  K 3.0* 3.3* 2.6* 3.6  CL 101 100 106 100  CO2 '25 24 26 27  '$ GLUCOSE 315* 368* 88 118*  BUN '14 16 17 17  '$ CREATININE 1.15* 1.02* 1.01* 1.03*  CALCIUM 8.5* 8.1* 8.2* 7.8*  PHOS  --   --  3.0  --     GFR: Estimated Creatinine Clearance: 34.4 mL/min (A) (by C-G formula based on SCr of 1.03 mg/dL (H)). Liver Function Tests: Recent Labs  Lab 10/25/21 0706  ALBUMIN 1.6*   No results for input(s): "LIPASE", "AMYLASE" in the last 168 hours. Recent Labs  Lab 10/24/21 0455  AMMONIA 29    Coagulation Profile: No results for input(s): "INR", "PROTIME" in the last 168 hours. Cardiac Enzymes: No  results for input(s): "CKTOTAL", "CKMB", "CKMBINDEX", "TROPONINI" in the last 168 hours. BNP (last 3 results) No results for input(s): "PROBNP" in the last 8760 hours. HbA1C: Recent Labs    10/25/21 0704  HGBA1C 12.6*   CBG: Recent Labs  Lab 10/24/21 1227 10/24/21 2336 10/25/21 0636 10/25/21 0658 10/25/21 1612  GLUCAP 269* 76 58* 79 127*    Lipid Profile: Recent Labs    10/24/21 0512  CHOL 362*  HDL 93  LDLCALC 227*  TRIG 210*  CHOLHDL 3.9    Thyroid Function Tests: Recent Labs    10/24/21 0511  TSH 1.591    Anemia Panel: Recent Labs    10/24/21 0510 10/24/21 0511  VITAMINB12  --  234  FOLATE 12.3  --    Urine analysis:    Component Value Date/Time   COLORURINE YELLOW 10/23/2021 1929  APPEARANCEUR HAZY (A) 10/23/2021 1929   LABSPEC 1.001 (L) 10/23/2021 1929   PHURINE 6.0 10/23/2021 1929   GLUCOSEU >=500 (A) 10/23/2021 1929   GLUCOSEU >=1000 (A) 11/17/2020 1442   HGBUR SMALL (A) 10/23/2021 1929   BILIRUBINUR NEGATIVE 10/23/2021 1929   BILIRUBINUR neg 08/01/2019 0930   KETONESUR NEGATIVE 10/23/2021 1929   PROTEINUR 100 (A) 10/23/2021 1929   UROBILINOGEN 0.2 11/17/2020 1442   NITRITE NEGATIVE 10/23/2021 1929   LEUKOCYTESUR NEGATIVE 10/23/2021 1929   Sepsis Labs: '@LABRCNTIP'$ (procalcitonin:4,lacticidven:4)  ) Recent Results (from the past 240 hour(s))  Resp Panel by RT-PCR (Flu A&B, Covid) Anterior Nasal Swab     Status: None   Collection Time: 10/23/21  7:29 PM   Specimen: Anterior Nasal Swab  Result Value Ref Range Status   SARS Coronavirus 2 by RT PCR NEGATIVE NEGATIVE Final    Comment: (NOTE) SARS-CoV-2 target nucleic acids are NOT DETECTED.  The SARS-CoV-2 RNA is generally detectable in upper respiratory specimens during the acute phase of infection. The lowest concentration of SARS-CoV-2 viral copies this assay can detect is 138 copies/mL. A negative result does not preclude SARS-Cov-2 infection and should not be used as the sole basis  for treatment or other patient management decisions. A negative result may occur with  improper specimen collection/handling, submission of specimen other than nasopharyngeal swab, presence of viral mutation(s) within the areas targeted by this assay, and inadequate number of viral copies(<138 copies/mL). A negative result must be combined with clinical observations, patient history, and epidemiological information. The expected result is Negative.  Fact Sheet for Patients:  EntrepreneurPulse.com.au  Fact Sheet for Healthcare Providers:  IncredibleEmployment.be  This test is no t yet approved or cleared by the [place] FDA and  has been authorized for detection and/or diagnosis of SARS-CoV-2 by FDA under an Emergency Use Authorization (EUA). This EUA will remain  in effect (meaning this test can be used) for the duration of the COVID-19 declaration under Section 564(b)(1) of the Act, 21 U.S.C.section 360bbb-3(b)(1), unless the authorization is terminated  or revoked sooner.       Influenza A by PCR NEGATIVE NEGATIVE Final   Influenza B by PCR NEGATIVE NEGATIVE Final    Comment: (NOTE) The Xpert Xpress SARS-CoV-2/FLU/RSV plus assay is intended as an aid in the diagnosis of influenza from Nasopharyngeal swab specimens and should not be used as a sole basis for treatment. Nasal washings and aspirates are unacceptable for Xpert Xpress SARS-CoV-2/FLU/RSV testing.  Fact Sheet for Patients: EntrepreneurPulse.com.au  Fact Sheet for Healthcare Providers: IncredibleEmployment.be  This test is not yet approved or cleared by the [place] FDA and has been authorized for detection and/or diagnosis of SARS-CoV-2 by FDA under an Emergency Use Authorization (EUA). This EUA will remain in effect (meaning this test can be used) for the duration of the COVID-19 declaration under Section 564(b)(1) of the Act, 21  U.S.C. section 360bbb-3(b)(1), unless the authorization is terminated or revoked.  Performed at [facility], [place] [address], [place], [place] [address]          Radiology Studies: EEG adult  Result Date: 2021/11/08 Derek Jack, MD     Nov 08, 2021  8:00 PM Routine EEG Report FUSAE FLORIO is a 76 y.o. female with a history of altered mental status who is undergoing an EEG to evaluate for seizures. Report: This EEG was acquired with electrodes placed according to the International 10-20 electrode system (including Fp1, Fp2, F3, F4, C3, C4, P3, P4, O1, O2,  T3, T4, T5, T6, A1, A2, Fz, Cz, Pz). The following electrodes were missing or displaced: none. The occipital dominant rhythm was 8.5 Hz. This activity is reactive to stimulation. Drowsiness was manifested by background fragmentation; deeper stages of sleep were identified by K complexes and sleep spindles. There was no focal slowing. There were no interictal epileptiform discharges. There were no electrographic seizures identified. Photic stimulation and hyperventilation were not performed. Impression: This EEG was obtained while awake and asleep and is normal.   Clinical Correlation: Normal EEGs, however, do not rule out epilepsy. Su Monks, MD [facility] [phone] If 7pm- 7am, please page neurology on call as listed in [place].   VAS US CAROTID  Result Date: 10/24/2021 Carotid Arterial Duplex Study Patient Name:  AJA WHITEHAIR  Date of Exam:   10/24/2021 Medical Rec #: [id number]             Accession #:    [id number] Date of Birth: 09-30-1945             Patient Gender: F Patient Age:   27 years Exam Location:  [facility] Procedure:      VAS US CAROTID Referring Phys: Alferd Patee [facility] --------------------------------------------------------------------------------  Indications:       CVA. Risk Factors:      Hypertension, hyperlipidemia, Diabetes, coronary artery                    disease.  Other Factors:     CKD II. Comparison Study:  Prior study [name] 04/24/21 indicating 1-39% ICA stenosis,                    bilaterally Performing Technologist: Sharion Dove RVS  Examination Guidelines: A complete evaluation includes B-mode imaging, spectral Doppler, color Doppler, and power Doppler as needed of all accessible portions of each vessel. Bilateral testing is considered an integral part of a complete examination. Limited examinations for reoccurring indications may be performed as noted.  Right Carotid Findings: +----------+--------+--------+--------+------------------+------------------+           PSV cm/sEDV cm/sStenosisPlaque DescriptionComments           +----------+--------+--------+--------+------------------+------------------+ CCA Prox  96      16                                intimal thickening +----------+--------+--------+--------+------------------+------------------+ CCA Distal71      12                                intimal thickening +----------+--------+--------+--------+------------------+------------------+ ICA Prox  90      20              heterogenous                         +----------+--------+--------+--------+------------------+------------------+ ICA Mid   83      17                                                   +----------+--------+--------+--------+------------------+------------------+ ICA Distal61      19                                                   +----------+--------+--------+--------+------------------+------------------+  ECA       121     11                                                   +----------+--------+--------+--------+------------------+------------------+ +----------+--------+-------+--------+-------------------+           PSV cm/sEDV cmsDescribeArm Pressure (mmHG) +----------+--------+-------+--------+-------------------+ Subclavian114                                         +----------+--------+-------+--------+-------------------+ +---------+--------+--+--------+--+ VertebralPSV cm/s93EDV cm/s16 +---------+--------+--+--------+--+  Left Carotid Findings: +----------+--------+--------+--------+------------------+------------------+           PSV cm/sEDV cm/sStenosisPlaque DescriptionComments           +----------+--------+--------+--------+------------------+------------------+ CCA Prox  107     17                                intimal thickening +----------+--------+--------+--------+------------------+------------------+ CCA Distal78      19                                intimal thickening +----------+--------+--------+--------+------------------+------------------+ ICA Prox  86      20              heterogenous                         +----------+--------+--------+--------+------------------+------------------+ ICA Mid   90      23                                                   +----------+--------+--------+--------+------------------+------------------+ ICA Distal81      25                                                   +----------+--------+--------+--------+------------------+------------------+ ECA       127     17                                                   +----------+--------+--------+--------+------------------+------------------+ +----------+--------+--------+--------+-------------------+           PSV cm/sEDV cm/sDescribeArm Pressure (mmHG) +----------+--------+--------+--------+-------------------+ [id number]                                          +----------+--------+--------+--------+-------------------+ +---------+--------+--+--------+--+--------+ VertebralPSV cm/s56EDV cm/s14Atypical +---------+--------+--+--------+--+--------+   Summary: Right Carotid: Velocities in the right ICA are consistent with a 1-39% stenosis. Left Carotid: Velocities in the left ICA are consistent with  a 1-39% stenosis. Vertebrals:  Right vertebral artery demonstrates antegrade flow. Left vertebral              exhibits atypical waveform. Subclavians: Abnormal  waveforms in bilateral subclavian. *See table(s) above for measurements and observations.     Preliminary    ECHOCARDIOGRAM COMPLETE  Result Date: 10/24/2021    ECHOCARDIOGRAM REPORT   Patient Name:   JULLIAN PREVITI Date of Exam: 10/24/2021 Medical Rec #:  [id number]            Height:       60.0 in Accession #:    [id number]           Weight:       121.0 lb Date of Birth:  1945-09-06            BSA:          1.508 m Patient Age:    53 years             BP:           177/77 mmHg Patient Gender: F                    HR:           74 bpm. Exam Location:  Inpatient Procedure: 2D Echo, Cardiac Doppler and Color Doppler Indications:    Stroke  History:        Patient has prior history of Echocardiogram examinations, most                 recent 04/27/2021. Pacemaker; Risk Factors:Diabetes,                 Hypertension, Dyslipidemia and Former Smoker. CKD. GERD.  Sonographer:    Clayton Lefort RDCS (AE) Referring Phys: Alferd Patee [facility] IMPRESSIONS  1. Left ventricular ejection fraction, by estimation, is 60 to 65%. The left ventricle has normal function. The left ventricle has no regional wall motion abnormalities. There is moderate left ventricular hypertrophy. Left ventricular diastolic parameters are consistent with Grade I diastolic dysfunction (impaired relaxation).  2. Right ventricular systolic function is normal. The right ventricular size is normal. Tricuspid regurgitation signal is inadequate for assessing PA pressure.  3. The mitral valve is grossly normal. Mild mitral valve regurgitation. No evidence of mitral stenosis.  4. The aortic valve is tricuspid. There is mild calcification of the aortic valve. There is mild thickening of the aortic valve. Aortic valve regurgitation is trivial. Aortic valve sclerosis/calcification is present, without any  evidence of aortic stenosis.  5. The inferior vena cava is normal in size with greater than 50% respiratory variability, suggesting right atrial pressure of 3 mmHg. Conclusion(s)/Recommendation(s): No intracardiac source of embolism detected on this transthoracic study. Consider a transesophageal echocardiogram to exclude cardiac source of embolism if clinically indicated. Future echocardiograms should include global longitudinal strain assessment in setting of moderate LVH. FINDINGS  Left Ventricle: Left ventricular ejection fraction, by estimation, is 60 to 65%. The left ventricle has normal function. The left ventricle has no regional wall motion abnormalities. The left ventricular internal cavity size was normal in size. There is  moderate left ventricular hypertrophy. Left ventricular diastolic parameters are consistent with Grade I diastolic dysfunction (impaired relaxation). Right Ventricle: The right ventricular size is normal. No increase in right ventricular wall thickness. Right ventricular systolic function is normal. Tricuspid regurgitation signal is inadequate for assessing PA pressure. Left Atrium: Left atrial size was normal in size. Right Atrium: Right atrial size was normal in size. Pericardium: Trivial pericardial effusion is present. Mitral Valve: Chordal SAM. The mitral valve is grossly normal. Mild mitral valve regurgitation. No evidence of mitral valve stenosis. Tricuspid  Valve: The tricuspid valve is normal in structure. Tricuspid valve regurgitation is trivial. No evidence of tricuspid stenosis. Aortic Valve: The aortic valve is tricuspid. There is mild calcification of the aortic valve. There is mild thickening of the aortic valve. Aortic valve regurgitation is trivial. Aortic valve sclerosis/calcification is present, without any evidence of aortic stenosis. Aortic valve mean gradient measures 3.0 mmHg. Aortic valve peak gradient measures 5.4 mmHg. Aortic valve area, by VTI measures 2.58  cm. Pulmonic Valve: The pulmonic valve was normal in structure. Pulmonic valve regurgitation is trivial. No evidence of pulmonic stenosis. Aorta: The aortic root is normal in size and structure. Venous: The inferior vena cava is normal in size with greater than 50% respiratory variability, suggesting right atrial pressure of 3 mmHg. IAS/Shunts: No atrial level shunt detected by color flow Doppler.  LEFT VENTRICLE PLAX 2D LVIDd:         3.80 cm   Diastology LVIDs:         2.20 cm   LV e' medial:    4.35 cm/s LV PW:         1.10 cm   LV E/e' medial:  17.2 LV IVS:        1.40 cm   LV e' lateral:   6.20 cm/s LVOT diam:     1.70 cm   LV E/e' lateral: 12.1 LV SV:         59 LV SV Index:   39 LVOT Area:     2.27 cm  RIGHT VENTRICLE             IVC RV Basal diam:  1.90 cm     IVC diam: 1.60 cm RV S prime:     14.50 cm/s TAPSE (M-mode): 1.9 cm LEFT ATRIUM             Index        RIGHT ATRIUM          Index LA diam:        3.40 cm 2.25 cm/m   RA Area:     8.57 cm LA Vol (A2C):   46.3 ml 30.71 ml/m  RA Volume:   14.60 ml 9.68 ml/m LA Vol (A4C):   36.3 ml 24.07 ml/m LA Biplane Vol: 41.7 ml 27.66 ml/m  AORTIC VALVE AV Area (Vmax):    2.52 cm AV Area (Vmean):   2.54 cm AV Area (VTI):     2.58 cm AV Vmax:           116.00 cm/s AV Vmean:          77.700 cm/s AV VTI:            0.228 m AV Peak Grad:      5.4 mmHg AV Mean Grad:      3.0 mmHg LVOT Vmax:         129.00 cm/s LVOT Vmean:        87.000 cm/s LVOT VTI:          0.259 m LVOT/AV VTI ratio: 1.14  AORTA Ao Root diam: 3.10 cm Ao Asc diam:  3.00 cm MITRAL VALVE MV Area (PHT): 2.93 cm     SHUNTS MV Decel Time: 259 msec     Systemic VTI:  0.26 m MV E velocity: 74.90 cm/s   Systemic Diam: 1.70 cm MV A velocity: 103.00 cm/s MV E/A ratio:  0.73 Cherlynn Kaiser MD Electronically signed by Cherlynn Kaiser MD Signature Date/Time: 10/24/2021/11:29:16 AM    Final  MR ANGIO HEAD WO CONTRAST  Result Date: 10/24/2021 CLINICAL DATA:  Acute stroke suspected EXAM: MRA HEAD  WITHOUT CONTRAST TECHNIQUE: Angiographic images of the Circle of Willis were acquired using MRA technique without intravenous contrast. COMPARISON:  Brain MRI earlier today and intracranial MRA 03/09/2019 FINDINGS: Anterior circulation: A few slices are affected by motion artifact, including at the level of the bilateral MCA. There is a bilateral M1 stenosis, at least moderate on the left and advanced on the right, accentuated by motion but chronic when compared to prior MRA. No evidence of aneurysm Posterior circulation: The vertebral and basilar arteries are smoothly contoured and widely patent. Mild atheromatous irregularity of the posterior cerebral arteries. IMPRESSION: 1. No emergent finding. Accounting for motion artifact, no convincing change from 2021. 2. Bilateral M1 segment stenosis, particularly advanced on the right where there has been prior MCA territory infarct. Electronically Signed   By: Jorje Guild M.D.   On: 10/24/2021 10:27   MR BRAIN WO CONTRAST  Result Date: 10/24/2021 CLINICAL DATA:  Altered mental status EXAM: MRI HEAD WITHOUT CONTRAST TECHNIQUE: Multiplanar, multiecho pulse sequences of the brain and surrounding structures were obtained without intravenous contrast. COMPARISON:  03/09/2019 FINDINGS: Brain: Punctate acute infarcts of the left cerebellum and left parietal lobe. 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 No acute hemorrhage. There is multifocal hyperintense T2-weighted signal within the white matter. Generalized volume loss. Old posterior right MCA territory infarct and punctate old right cerebellar infarct. The midline structures are normal. Vascular: Major flow voids are preserved. Skull and upper cervical spine: Normal calvarium and skull base. Visualized upper cervical spine and soft tissues are normal. Sinuses/Orbits:No paranasal sinus fluid levels or advanced mucosal thickening. No mastoid or middle ear  effusion. Normal orbits. IMPRESSION: 1. Punctate acute infarcts of the left cerebellum and left parietal lobe. No hemorrhage or mass effect. 2. Old posterior right MCA territory infarct and punctate old right cerebellar infarct. Electronically Signed   By: Ulyses Jarred M.D.   On: 10/24/2021 03:26   DG Chest Port 1 View  Result Date: 10/23/2021 CLINICAL DATA:  Chest pain. EXAM: PORTABLE CHEST 1 VIEW COMPARISON:  Radiograph 10/16/2016 FINDINGS: The cardiomediastinal contours are normal. Loop recorder in the left chest wall. Pulmonary vasculature is normal. No consolidation, pleural effusion, or pneumothorax. No acute osseous abnormalities are seen. IMPRESSION: No acute chest findings. Electronically Signed   By: Keith Rake M.D.   On: 10/23/2021 23:32   CT Head Wo Contrast  Result Date: 10/23/2021 CLINICAL DATA:  Transient ischemic attack. Frontal headaches for 2 days. EXAM: CT HEAD WITHOUT CONTRAST TECHNIQUE: Contiguous axial images were obtained from the base of the skull through the vertex without intravenous contrast. RADIATION DOSE REDUCTION: This exam was performed according to the departmental dose-optimization program which includes automated exposure control, adjustment of the mA and/or kV according to patient size and/or use of iterative reconstruction technique. COMPARISON:  MRI 03/09/2019.  CT 02/08/2018 FINDINGS: Brain: There is a large area of encephalomalacia involving the right frontal, temporal, and parietal regions. This is consistent with infarct. The infarct has progressed since prior MRI and CT studies. No mass-effect or midline shift. No abnormal extra-axial fluid collections. Basal cisterns are not effaced. Mild cerebral atrophy. Ventricular dilatation consistent with central atrophy. Low-attenuation changes in the deep white matter consistent small vessel ischemic change. No acute intracranial hemorrhage. Vascular: Intracranial arterial calcifications. Skull: Calvarium appears  intact. Sinuses/Orbits: Paranasal sinuses and mastoid air cells  are clear. Other: None. IMPRESSION: 1. 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 2. No acute intracranial hemorrhage or mass effect. 3. Chronic atrophy and small vessel ischemic changes. Electronically Signed   By: Lucienne Capers M.D.   On: 10/23/2021 20:46        Scheduled Meds:  aspirin EC  81 mg Oral Daily   atorvastatin  80 mg Oral Daily   clopidogrel  75 mg Oral Daily   ezetimibe  10 mg Oral Daily   heparin  5,000 Units Subcutaneous Q8H   insulin glargine-yfgn  80 Units Subcutaneous Q1500   losartan  25 mg Oral Daily   Continuous Infusions:     LOS: 1 day    Time spent: 55 minutes.    Dana Allan, MD  [facility] Pager #: [phone] 7PM-7AM contact night coverage as above

## 2021-10-25 NOTE — Progress Notes (Signed)
Progress Note  Patient Name: BREZLYN MANRIQUE Date of Encounter: 10/25/2021  Primary Cardiologist: Werner Lean, MD   Subjective   Overnight doing well. Patient notes that she feels back to normal. No CP, SOB, Palpitations. Feels empowered to make some changes at discharge to improve glucose control.  Inpatient Medications    Scheduled Meds:  aspirin EC  81 mg Oral Daily   atorvastatin  80 mg Oral Daily   clopidogrel  75 mg Oral Daily   ezetimibe  10 mg Oral Daily   heparin  5,000 Units Subcutaneous Q8H   insulin glargine-yfgn  80 Units Subcutaneous Q1500   potassium chloride  40 mEq Oral Q4H   Continuous Infusions:  PRN Meds: acetaminophen, nitroGLYCERIN, ondansetron (ZOFRAN) IV   Vital Signs    Vitals:   10/24/21 2024 10/24/21 2335 10/25/21 0442 10/25/21 0855  BP: (!) 172/74 (!) 159/69 (!) 161/67 (!) 126/110  Pulse: 86 77 78 83  Resp: '17 19 19 19  '$ Temp: 99.2 F (37.3 C) 98.5 F (36.9 C) 98.2 F (36.8 C) 98.3 F (36.8 C)  TempSrc: Oral Oral  Oral  SpO2: 100% 99% 97% 100%  Weight:   53.1 kg   Height:        Intake/Output Summary (Last 24 hours) at 10/25/2021 1126 Last data filed at 10/25/2021 0400 Gross per 24 hour  Intake 870 ml  Output --  Net 870 ml   Filed Weights   10/23/21 1552 10/25/21 0442  Weight: 54.9 kg 53.1 kg    Telemetry    SR with rare PVCs - Personally Reviewed  Physical Exam   Gen: No distress  Neck: No JVD Cardiac: No Rubs or Gallops, No murmur, RRR +2 radial pulses Respiratory: Clear to auscultation bilaterally, normal effort, normal  respiratory rate GI: Soft, nontender, non-distended   Labs    Chemistry Recent Labs  Lab 10/23/21 1941 10/24/21 0512 10/25/21 0706  NA 140 136 139  K 3.0* 3.3* 2.6*  CL 101 100 106  CO2 '25 24 26  '$ GLUCOSE 315* 368* 88  BUN '14 16 17  '$ CREATININE 1.15* 1.02* 1.01*  CALCIUM 8.5* 8.1* 8.2*  ALBUMIN  --   --  1.6*  GFRNONAA 50* 58* 58*  ANIONGAP '14 12 7      '$ Hematology Recent Labs  Lab 10/23/21 1941 10/24/21 0511 10/25/21 0704  WBC 11.5* 11.1* 8.0  RBC 4.23 3.82* 3.66*  HGB 13.0 11.9* 11.2*  HCT 39.7 35.2* 34.1*  MCV 93.9 92.1 93.2  MCH 30.7 31.2 30.6  MCHC 32.7 33.8 32.8  RDW 13.2 13.1 13.0  PLT 415* 343 352    Cardiac EnzymesNo results for input(s): "TROPONINI" in the last 168 hours. No results for input(s): "TROPIPOC" in the last 168 hours.   BNPNo results for input(s): "BNP", "PROBNP" in the last 168 hours.   DDimer No results for input(s): "DDIMER" in the last 168 hours.   Radiology    EEG adult  Result Date: 10/24/2021 Derek Jack, MD     10/24/2021  8:00 PM Routine EEG Report NIKIA LEVELS is a 76 y.o. female with a history of altered mental status who is undergoing an EEG to evaluate for seizures. Report: This EEG was acquired with electrodes placed according to the International 10-20 electrode system (including Fp1, Fp2, F3, F4, C3, C4, P3, P4, O1, O2, T3, T4, T5, T6, A1, A2, Fz, Cz, Pz). The following electrodes were missing or displaced: none. The occipital dominant rhythm was  8.5 Hz. This activity is reactive to stimulation. Drowsiness was manifested by background fragmentation; deeper stages of sleep were identified by K complexes and sleep spindles. There was no focal slowing. There were no interictal epileptiform discharges. There were no electrographic seizures identified. Photic stimulation and hyperventilation were not performed. Impression: This EEG was obtained while awake and asleep and is normal.   Clinical Correlation: Normal EEGs, however, do not rule out epilepsy. Su Monks, MD [facility] [phone] If 7pm- 7am, please page neurology on call as listed in [place].   VAS US CAROTID  Result Date: 10/24/2021 Carotid Arterial Duplex Study Patient Name:  ICEIS KNAB  Date of Exam:   10/24/2021 Medical Rec #: [id number]             Accession #:    [id number] Date of Birth:  03/31/1945             Patient Gender: F Patient Age:   60 years Exam [facility]  [facility] Procedure:      VAS US CAROTID Referring Phys: [facility] --------------------------------------------------------------------------------  Indications:       CVA. Risk Factors:      Hypertension, hyperlipidemia, Diabetes, coronary artery                    disease. Other Factors:     CKD II. Comparison Study:  Prior study done 04/24/21 indicating 1-39% ICA stenosis,                    bilaterally Performing Technologist: Sharion Dove RVS  Examination Guidelines: A complete evaluation includes B-mode imaging, spectral Doppler, color Doppler, and power Doppler as needed of all accessible portions of each vessel. Bilateral testing is considered an integral part of a complete examination. Limited examinations for reoccurring indications may be performed as noted.  Right Carotid Findings: +----------+--------+--------+--------+------------------+------------------+           PSV cm/sEDV cm/sStenosisPlaque DescriptionComments           +----------+--------+--------+--------+------------------+------------------+ CCA Prox  96      16                                intimal thickening +----------+--------+--------+--------+------------------+------------------+ CCA Distal71      12                                intimal thickening +----------+--------+--------+--------+------------------+------------------+ ICA Prox  90      20              heterogenous                         +----------+--------+--------+--------+------------------+------------------+ ICA Mid   83      17                                                   +----------+--------+--------+--------+------------------+------------------+ ICA Distal61      19                                                   +----------+--------+--------+--------+------------------+------------------+  ECA       121     11                                                    +----------+--------+--------+--------+------------------+------------------+ +----------+--------+-------+--------+-------------------+           PSV cm/sEDV cmsDescribeArm Pressure (mmHG) +----------+--------+-------+--------+-------------------+ Subclavian114                                        +----------+--------+-------+--------+-------------------+ +---------+--------+--+--------+--+ VertebralPSV cm/s93EDV cm/s16 +---------+--------+--+--------+--+  Left Carotid Findings: +----------+--------+--------+--------+------------------+------------------+           PSV cm/sEDV cm/sStenosisPlaque DescriptionComments           +----------+--------+--------+--------+------------------+------------------+ CCA Prox  107     17                                intimal thickening +----------+--------+--------+--------+------------------+------------------+ CCA Distal78      19                                intimal thickening +----------+--------+--------+--------+------------------+------------------+ ICA Prox  86      20              heterogenous                         +----------+--------+--------+--------+------------------+------------------+ ICA Mid   90      23                                                   +----------+--------+--------+--------+------------------+------------------+ ICA Distal81      25                                                   +----------+--------+--------+--------+------------------+------------------+ ECA       127     17                                                   +----------+--------+--------+--------+------------------+------------------+ +----------+--------+--------+--------+-------------------+           PSV cm/sEDV cm/sDescribeArm Pressure (mmHG) +----------+--------+--------+--------+-------------------+ [id number]                                           +----------+--------+--------+--------+-------------------+ +---------+--------+--+--------+--+--------+ VertebralPSV cm/s56EDV cm/s14Atypical +---------+--------+--+--------+--+--------+   Summary: Right Carotid: Velocities in the right ICA are consistent with a 1-39% stenosis. Left Carotid: Velocities in the left ICA are consistent with a 1-39% stenosis. Vertebrals:  Right vertebral artery demonstrates antegrade flow. Left vertebral              exhibits atypical waveform. Subclavians: Abnormal  waveforms in bilateral subclavian. *See table(s) above for measurements and observations.     Preliminary    ECHOCARDIOGRAM COMPLETE  Result Date: 10/24/2021    ECHOCARDIOGRAM REPORT   Patient Name:   DAUNE DIVIRGILIO Date of Exam: 10/24/2021 Medical Rec #:  [id number]            Height:       60.0 in Accession #:    [id number]           Weight:       121.0 lb Date of Birth:  Jan 04, 1946            BSA:          1.508 m Patient Age:    30 years             BP:           177/77 mmHg Patient Gender: F                    HR:           74 bpm. Exam Location:  Inpatient Procedure: 2D Echo, Cardiac Doppler and Color Doppler Indications:    Stroke  History:        Patient has prior history of Echocardiogram examinations, most                 recent 04/27/2021. Pacemaker; Risk Factors:Diabetes,                 Hypertension, Dyslipidemia and Former Smoker. CKD. GERD.  Sonographer:    Clayton Lefort RDCS (AE) Referring Phys: Alferd Patee [facility] IMPRESSIONS  1. Left ventricular ejection fraction, by estimation, is 60 to 65%. The left ventricle has normal function. The left ventricle has no regional wall motion abnormalities. There is moderate left ventricular hypertrophy. Left ventricular diastolic parameters are consistent with Grade I diastolic dysfunction (impaired relaxation).  2. Right ventricular systolic function is normal. The right ventricular size is normal. Tricuspid regurgitation signal is inadequate for  assessing PA pressure.  3. The mitral valve is grossly normal. Mild mitral valve regurgitation. No evidence of mitral stenosis.  4. The aortic valve is tricuspid. There is mild calcification of the aortic valve. There is mild thickening of the aortic valve. Aortic valve regurgitation is trivial. Aortic valve sclerosis/calcification is present, without any evidence of aortic stenosis.  5. The inferior vena cava is normal in size with greater than 50% respiratory variability, suggesting right atrial pressure of 3 mmHg. Conclusion(s)/Recommendation(s): No intracardiac source of embolism detected on this transthoracic study. Consider a transesophageal echocardiogram to exclude cardiac source of embolism if clinically indicated. Future echocardiograms should include global longitudinal strain assessment in setting of moderate LVH. FINDINGS  Left Ventricle: Left ventricular ejection fraction, by estimation, is 60 to 65%. The left ventricle has normal function. The left ventricle has no regional wall motion abnormalities. The left ventricular internal cavity size was normal in size. There is  moderate left ventricular hypertrophy. Left ventricular diastolic parameters are consistent with Grade I diastolic dysfunction (impaired relaxation). Right Ventricle: The right ventricular size is normal. No increase in right ventricular wall thickness. Right ventricular systolic function is normal. Tricuspid regurgitation signal is inadequate for assessing PA pressure. Left Atrium: Left atrial size was normal in size. Right Atrium: Right atrial size was normal in size. Pericardium: Trivial pericardial effusion is present. Mitral Valve: Chordal SAM. The mitral valve is grossly normal. Mild mitral valve regurgitation. No evidence of mitral valve stenosis. Tricuspid  Valve: The tricuspid valve is normal in structure. Tricuspid valve regurgitation is trivial. No evidence of tricuspid stenosis. Aortic Valve: The aortic valve is tricuspid.  There is mild calcification of the aortic valve. There is mild thickening of the aortic valve. Aortic valve regurgitation is trivial. Aortic valve sclerosis/calcification is present, without any evidence of aortic stenosis. Aortic valve mean gradient measures 3.0 mmHg. Aortic valve peak gradient measures 5.4 mmHg. Aortic valve area, by VTI measures 2.58 cm. Pulmonic Valve: The pulmonic valve was normal in structure. Pulmonic valve regurgitation is trivial. No evidence of pulmonic stenosis. Aorta: The aortic root is normal in size and structure. Venous: The inferior vena cava is normal in size with greater than 50% respiratory variability, suggesting right atrial pressure of 3 mmHg. IAS/Shunts: No atrial level shunt detected by color flow Doppler.  LEFT VENTRICLE PLAX 2D LVIDd:         3.80 cm   Diastology LVIDs:         2.20 cm   LV e' medial:    4.35 cm/s LV PW:         1.10 cm   LV E/e' medial:  17.2 LV IVS:        1.40 cm   LV e' lateral:   6.20 cm/s LVOT diam:     1.70 cm   LV E/e' lateral: 12.1 LV SV:         59 LV SV Index:   39 LVOT Area:     2.27 cm  RIGHT VENTRICLE             IVC RV Basal diam:  1.90 cm     IVC diam: 1.60 cm RV S prime:     14.50 cm/s TAPSE (M-mode): 1.9 cm LEFT ATRIUM             Index        RIGHT ATRIUM          Index LA diam:        3.40 cm 2.25 cm/m   RA Area:     8.57 cm LA Vol (A2C):   46.3 ml 30.71 ml/m  RA Volume:   14.60 ml 9.68 ml/m LA Vol (A4C):   36.3 ml 24.07 ml/m LA Biplane Vol: 41.7 ml 27.66 ml/m  AORTIC VALVE AV Area (Vmax):    2.52 cm AV Area (Vmean):   2.54 cm AV Area (VTI):     2.58 cm AV Vmax:           116.00 cm/s AV Vmean:          77.700 cm/s AV VTI:            0.228 m AV Peak Grad:      5.4 mmHg AV Mean Grad:      3.0 mmHg LVOT Vmax:         129.00 cm/s LVOT Vmean:        87.000 cm/s LVOT VTI:          0.259 m LVOT/AV VTI ratio: 1.14  AORTA Ao Root diam: 3.10 cm Ao Asc diam:  3.00 cm MITRAL VALVE MV Area (PHT): 2.93 cm     SHUNTS MV Decel Time: 259 msec      Systemic VTI:  0.26 m MV E velocity: 74.90 cm/s   Systemic Diam: 1.70 cm MV A velocity: 103.00 cm/s MV E/A ratio:  0.73 Cherlynn Kaiser MD Electronically signed by Cherlynn Kaiser MD Signature Date/Time: 10/24/2021/11:29:16 AM    Final  MR ANGIO HEAD WO CONTRAST  Result Date: 10/24/2021 CLINICAL DATA:  Acute stroke suspected EXAM: MRA HEAD WITHOUT CONTRAST TECHNIQUE: Angiographic images of the Circle of Willis were acquired using MRA technique without intravenous contrast. COMPARISON:  Brain MRI earlier today and intracranial MRA 03/09/2019 FINDINGS: Anterior circulation: A few slices are affected by motion artifact, including at the level of the bilateral MCA. There is a bilateral M1 stenosis, at least moderate on the left and advanced on the right, accentuated by motion but chronic when compared to prior MRA. No evidence of aneurysm Posterior circulation: The vertebral and basilar arteries are smoothly contoured and widely patent. Mild atheromatous irregularity of the posterior cerebral arteries. IMPRESSION: 1. No emergent finding. Accounting for motion artifact, no convincing change from 2021. 2. Bilateral M1 segment stenosis, particularly advanced on the right where there has been prior MCA territory infarct. Electronically Signed   By: Jorje Guild M.D.   On: 10/24/2021 10:27   MR BRAIN WO CONTRAST  Result Date: 10/24/2021 CLINICAL DATA:  Altered mental status EXAM: MRI HEAD WITHOUT CONTRAST TECHNIQUE: Multiplanar, multiecho pulse sequences of the brain and surrounding structures were obtained without intravenous contrast. COMPARISON:  03/09/2019 FINDINGS: Brain: Punctate acute infarcts of the left cerebellum and left parietal lobe. 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 No acute hemorrhage. There is multifocal hyperintense T2-weighted signal within the white matter. Generalized volume loss. Old posterior right MCA territory  infarct and punctate old right cerebellar infarct. The midline structures are normal. Vascular: Major flow voids are preserved. Skull and upper cervical spine: Normal calvarium and skull base. Visualized upper cervical spine and soft tissues are normal. Sinuses/Orbits:No paranasal sinus fluid levels or advanced mucosal thickening. No mastoid or middle ear effusion. Normal orbits. IMPRESSION: 1. Punctate acute infarcts of the left cerebellum and left parietal lobe. No hemorrhage or mass effect. 2. Old posterior right MCA territory infarct and punctate old right cerebellar infarct. Electronically Signed   By: Ulyses Jarred M.D.   On: 10/24/2021 03:26   DG Chest Port 1 View  Result Date: 10/23/2021 CLINICAL DATA:  Chest pain. EXAM: PORTABLE CHEST 1 VIEW COMPARISON:  Radiograph 10/16/2016 FINDINGS: The cardiomediastinal contours are normal. Loop recorder in the left chest wall. Pulmonary vasculature is normal. No consolidation, pleural effusion, or pneumothorax. No acute osseous abnormalities are seen. IMPRESSION: No acute chest findings. Electronically Signed   By: Keith Rake M.D.   On: 10/23/2021 23:32   CT Head Wo Contrast  Result Date: 10/23/2021 CLINICAL DATA:  Transient ischemic attack. Frontal headaches for 2 days. EXAM: CT HEAD WITHOUT CONTRAST TECHNIQUE: Contiguous axial images were obtained from the base of the skull through the vertex without intravenous contrast. RADIATION DOSE REDUCTION: This exam was performed according to the departmental dose-optimization program which includes automated exposure control, adjustment of the mA and/or kV according to patient size and/or use of iterative reconstruction technique. COMPARISON:  MRI 03/09/2019.  CT 02/08/2018 FINDINGS: Brain: There is a large area of encephalomalacia involving the right frontal, temporal, and parietal regions. This is consistent with infarct. The infarct has progressed since prior MRI and CT studies. No mass-effect or midline  shift. No abnormal extra-axial fluid collections. Basal cisterns are not effaced. Mild cerebral atrophy. Ventricular dilatation consistent with central atrophy. Low-attenuation changes in the deep white matter consistent small vessel ischemic change. No acute intracranial hemorrhage. Vascular: Intracranial arterial calcifications. Skull: Calvarium appears intact. Sinuses/Orbits: Paranasal sinuses and mastoid air cells are  clear. Other: None. IMPRESSION: 1. 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 2. No acute intracranial hemorrhage or mass effect. 3. Chronic atrophy and small vessel ischemic changes. Electronically Signed   By: Lucienne Capers M.D.   On: 10/23/2021 20:46     Patient Profile     76 y.o. female with history of CAD who presents with stroke  Assessment & Plan    New Stroke With prior CAD s/p RCA PCI With HTN and DM, HLD With CKD and hypokalemia this morning - Plans to gradual normalize BP per primary; recommend adding ARB back first when OK per neurology given hypokalemia - concerned that she is not taking her lipid therapy;statin and zetia at discharge, given her elevation at discharge recommend [facility] visit for consideration of PCK9i vs Inclisiran  - 30 day heart monitor planned at DC   For questions or updates, please contact [facility] Please consult [email or web address] for contact info under Cardiology/STEMI.      Rudean Haskell, MD [id number] [place], [place] [address] [phone]  11:26 AM

## 2021-10-26 DIAGNOSIS — E11649 Type 2 diabetes mellitus with hypoglycemia without coma: Secondary | ICD-10-CM

## 2021-10-26 DIAGNOSIS — R7989 Other specified abnormal findings of blood chemistry: Secondary | ICD-10-CM

## 2021-10-26 LAB — RENAL FUNCTION PANEL
Albumin: 1.6 g/dL — ABNORMAL LOW (ref 3.5–5.0)
Anion gap: 7 (ref 5–15)
BUN: 22 mg/dL (ref 8–23)
CO2: 24 mmol/L (ref 22–32)
Calcium: 8 mg/dL — ABNORMAL LOW (ref 8.9–10.3)
Chloride: 108 mmol/L (ref 98–111)
Creatinine, Ser: 0.96 mg/dL (ref 0.44–1.00)
GFR, Estimated: >60 mL/min (ref >60)
Glucose, Bld: 62 mg/dL — ABNORMAL LOW (ref 70–99)
Phosphorus: 2.3 mg/dL — ABNORMAL LOW (ref 2.5–4.6)
Potassium: 3.4 mmol/L — ABNORMAL LOW (ref 3.5–5.1)
Sodium: 139 mmol/L (ref 135–145)

## 2021-10-26 LAB — GLUCOSE, CAPILLARY
Glucose-Capillary: 63 mg/dL — ABNORMAL LOW (ref 70–99)
Glucose-Capillary: 93 mg/dL (ref 70–99)
Glucose-Capillary: 220 mg/dL — ABNORMAL HIGH (ref 70–99)
Glucose-Capillary: 208 mg/dL — ABNORMAL HIGH (ref 70–99)
Glucose-Capillary: 199 mg/dL — ABNORMAL HIGH (ref 70–99)

## 2021-10-26 LAB — MAGNESIUM: Magnesium: 2 mg/dL (ref 1.7–2.4)

## 2021-10-26 MED ORDER — INSULIN ASPART 100 UNIT/ML IJ SOLN: 0.0000 [IU] | Freq: Every day | INTRAMUSCULAR | Status: DC

## 2021-10-26 MED ORDER — INSULIN GLARGINE-YFGN 100 UNIT/ML ~~LOC~~ SOLN
60.0000 [IU] | Freq: Every day | SUBCUTANEOUS | Status: DC
  Administered 2021-10-26: 60 [IU] via SUBCUTANEOUS
  Filled 2021-10-26 (×2): qty 0.6

## 2021-10-26 MED ORDER — INSULIN ASPART 100 UNIT/ML IJ SOLN
0.0000 [IU] | Freq: Three times a day (TID) | INTRAMUSCULAR | Status: DC
  Administered 2021-10-26 (×2): 2 [IU] via SUBCUTANEOUS
  Administered 2021-10-28: 1 [IU] via SUBCUTANEOUS

## 2021-10-26 MED ORDER — INSULIN ASPART 100 UNIT/ML IJ SOLN: 0.0000 [IU] | Freq: Three times a day (TID) | INTRAMUSCULAR | Status: DC

## 2021-10-26 NOTE — Progress Notes (Signed)
PROGRESS NOTE  [name]:[id number] DOB: July 26, 1945 DOA: 10/23/2021 PCP: [name], [name], MD  HPI/Recap of past 24 hours: Sarah [name] is a 76 y.o. female with medical history significant of type 2 DM, HTN, stroke, CKD II, CAD  s/p stent  1 year ago, presents to ED with change in [place].  Patient was found to have acute CVA and admitted for further management.    Today, patient denies any new complaints, awaiting CIR.     Assessment/Plan: Principal Problem:   Change in mental status Active Problems:   Primary hypertension   Cerebrovascular accident (CVA) due to embolism of cerebral artery ([place])   Uncontrolled type 2 diabetes mellitus with hypoglycemia without coma (HCC)   Elevated troponin   Acute infarct of the left cerebellum, left parietal lobe History of CVA, with prior loop recorder which showed no A-fib in 2018 MRI showed above MRA with no emergent finding Carotid Doppler unremarkable 2D echo with EF of 60 to 65% Neurology consulted, recommend aspirin and plavix for 3 weeks and then Plavix alone.  Recommend 30-day event monitor as an outpatient.  Outpatient follow-up PT/OT/SLP-CIR  Possible acute metabolic encephalopathy Resolved Of unclear etiology, possibly above  Hypertension Noncompliant BP somewhat stable Continue losartan gradually lowering BP Continue to hold Lasix, hydralazine, Norvasc  Elevated troponin Likely 2/2 demand ischemia Currently chest pain-free Cardiology consulted, no further recs  Hyperlipidemia LDL 227, not compliance with meds Continue Lipitor, [facility] Outpatient lipid clinic  Diabetes mellitus type 2 A1c 12.6 SSI, Accu-Cheks, hypoglycemic protocol Diabetes coordinator  AKI Improving Daily BMP  Hypokalemia Replace as needed      Estimated body mass index is 23.25 kg/m as calculated from the following:   Height as of this encounter: 5' (1.524 m).   Weight as of this encounter: 54 kg.     Code  Status: Full  Family Communication: None at bedside  Disposition Plan: Status is: Inpatient Remains inpatient appropriate because: Level of care      Consultants: Neurology Cardiology  Procedures: None  Antimicrobials: None  DVT prophylaxis: Heparin   Objective: Vitals:   10/26/21 0500 10/26/21 0817 10/26/21 1123 10/26/21 1654  BP:  (!) 161/76 (!) 174/77 (!) 163/76  Pulse:  86 85 83  Resp:  '16 18 18  '$ Temp:  98.2 F (36.8 C) 98.4 F (36.9 C) 98.2 F (36.8 C)  TempSrc:  Oral Oral Oral  SpO2:  98% 98% 100%  Weight: 54 kg     Height:        Intake/Output Summary (Last 24 hours) at 10/26/2021 1716 Last data filed at 10/26/2021 0400 Gross per 24 hour  Intake 270 ml  Output --  Net 270 ml   Filed Weights   10/23/21 1552 10/25/21 0442 10/26/21 0500  Weight: 54.9 kg 53.1 kg 54 kg    Exam: General: NAD  Cardiovascular: S1, S2 present Respiratory: CTAB Abdomen: Soft, nontender, nondistended, bowel sounds present Musculoskeletal: No bilateral pedal edema noted Skin: Normal Psychiatry: Normal mood  Neurology: Sensation and strength equal in all extremity    Data Reviewed: CBC: Recent Labs  Lab 10/23/21 1941 10/24/21 0511 10/25/21 0704  WBC 11.5* 11.1* 8.0  NEUTROABS 8.5*  --  5.3  HGB 13.0 11.9* 11.2*  HCT 39.7 35.2* 34.1*  MCV 93.9 92.1 93.2  PLT 415* 343 354   Basic Metabolic Panel: Recent Labs  Lab 10/23/21 1941 10/24/21 0512 10/25/21 0706 10/25/21 1620 10/26/21 0445  NA 140 136 139 136 139  K 3.0* 3.3* 2.6* 3.6 3.4*  CL 101 100 106 100 108  CO2 '25 24 26 27 24  '$ GLUCOSE 315* 368* 88 118* 62*  BUN '14 16 17 17 22  '$ CREATININE 1.15* 1.02* 1.01* 1.03* 0.96  CALCIUM 8.5* 8.1* 8.2* 7.8* 8.0*  MG  --   --   --   --  2.0  PHOS  --   --  3.0  --  2.3*   GFR: Estimated Creatinine Clearance: 36.9 mL/min (by C-G formula based on SCr of 0.96 mg/dL). Liver Function Tests: Recent Labs  Lab 10/25/21 0706 10/26/21 0445  ALBUMIN 1.6* 1.6*    No results for input(s): "LIPASE", "AMYLASE" in the last 168 hours. Recent Labs  Lab 10/24/21 0455  AMMONIA 29   Coagulation Profile: No results for input(s): "INR", "PROTIME" in the last 168 hours. Cardiac Enzymes: No results for input(s): "CKTOTAL", "CKMB", "CKMBINDEX", "TROPONINI" in the last 168 hours. BNP (last 3 results) No results for input(s): "PROBNP" in the last 8760 hours. HbA1C: Recent Labs    10/25/21 0704  HGBA1C 12.6*   CBG: Recent Labs  Lab 10/25/21 2336 10/26/21 0620 10/26/21 0646 10/26/21 1124 10/26/21 1651  GLUCAP 117* 63* 93 220* 208*   Lipid Profile: Recent Labs    10/24/21 0512  CHOL 362*  HDL 93  LDLCALC 227*  TRIG 210*  CHOLHDL 3.9   Thyroid Function Tests: Recent Labs    10/24/21 0511  TSH 1.591   Anemia Panel: Recent Labs    10/24/21 0510 10/24/21 0511  VITAMINB12  --  234  FOLATE 12.3  --    Urine analysis:    Component Value Date/Time   COLORURINE YELLOW 10/23/2021 1929   APPEARANCEUR HAZY (A) 10/23/2021 1929   LABSPEC 1.001 (L) 10/23/2021 1929   PHURINE 6.0 10/23/2021 1929   GLUCOSEU >=500 (A) 10/23/2021 1929   GLUCOSEU >=1000 (A) 11/17/2020 1442   HGBUR SMALL (A) 10/23/2021 1929   BILIRUBINUR NEGATIVE 10/23/2021 1929   BILIRUBINUR neg 08/01/2019 0930   KETONESUR NEGATIVE 10/23/2021 1929   PROTEINUR 100 (A) 10/23/2021 1929   UROBILINOGEN 0.2 11/17/2020 1442   NITRITE NEGATIVE 10/23/2021 1929   LEUKOCYTESUR NEGATIVE 10/23/2021 1929   Sepsis Labs: '@LABRCNTIP'$ (procalcitonin:4,lacticidven:4)  ) Recent Results (from the past 240 hour(s))  Resp Panel by RT-PCR (Flu A&B, Covid) Anterior Nasal Swab     Status: None   Collection Time: 10/23/21  7:29 PM   Specimen: Anterior Nasal Swab  Result Value Ref Range Status   SARS Coronavirus 2 by RT PCR NEGATIVE NEGATIVE Final    Comment: (NOTE) SARS-CoV-2 target nucleic acids are NOT DETECTED.  The SARS-CoV-2 RNA is generally detectable in upper respiratory specimens  during the acute phase of infection. The lowest concentration of SARS-CoV-2 viral copies this assay can detect is 138 copies/mL. A negative result does not preclude SARS-Cov-2 infection and should not be used as the sole basis for treatment or other patient management decisions. A negative result may occur with  improper specimen collection/handling, submission of specimen other than nasopharyngeal swab, presence of viral mutation(s) within the areas targeted by this assay, and inadequate number of viral copies(<138 copies/mL). A negative result must be combined with clinical observations, patient history, and epidemiological information. The expected result is Negative.  Fact Sheet for Patients:  EntrepreneurPulse.com.au  Fact Sheet for Healthcare Providers:  IncredibleEmployment.be  This test is no t yet approved or cleared by the [place] FDA and  has been authorized for detection and/or diagnosis of  SARS-CoV-2 by FDA under an Emergency Use Authorization (EUA). This EUA will remain  in effect (meaning this test can be used) for the duration of the COVID-19 declaration under Section 564(b)(1) of the Act, 21 U.S.C.section 360bbb-3(b)(1), unless the authorization is terminated  or revoked sooner.       Influenza A by PCR NEGATIVE NEGATIVE Final   Influenza B by PCR NEGATIVE NEGATIVE Final    Comment: (NOTE) The Xpert Xpress SARS-CoV-2/FLU/RSV plus assay is intended as an aid in the diagnosis of influenza from Nasopharyngeal swab specimens and should not be used as a sole basis for treatment. Nasal washings and aspirates are unacceptable for Xpert Xpress SARS-CoV-2/FLU/RSV testing.  Fact Sheet for Patients: EntrepreneurPulse.com.au  Fact Sheet for Healthcare Providers: IncredibleEmployment.be  This test is not yet approved or cleared by the [place] FDA and has been authorized for detection  and/or diagnosis of SARS-CoV-2 by FDA under an Emergency Use Authorization (EUA). This EUA will remain in effect (meaning this test can be used) for the duration of the COVID-19 declaration under Section 564(b)(1) of the Act, 21 U.S.C. section 360bbb-3(b)(1), unless the authorization is terminated or revoked.  Performed at [facility], [place] [address], [place], [place] [address]       Studies: No results found.  Scheduled Meds:  aspirin EC  81 mg Oral Daily   atorvastatin  80 mg Oral Daily   clopidogrel  75 mg Oral Daily   ezetimibe  10 mg Oral Daily   heparin  5,000 Units Subcutaneous Q8H   insulin aspart  0-5 Units Subcutaneous QHS   insulin aspart  0-6 Units Subcutaneous TID WC   insulin glargine-yfgn  60 Units Subcutaneous Q1500   losartan  25 mg Oral Daily    Continuous Infusions:   LOS: 2 days     Alma Friendly, MD [facility]  If 7PM-7AM, please contact night-coverage [email or web address] 10/26/2021, 5:16 PM

## 2021-10-26 NOTE — Progress Notes (Signed)
  Transition of Care [facility]) Screening Note   Patient Details  Name: Sarah [name] Date of Birth: February 16, 1945   Transition of Care [facility]) CM/SW Contact:    Pollie Friar, RN Phone Number: 10/26/2021, 2:20 PM   Pt is from home with mom. CIR starting insurance for admission. Transition of Care Department [facility]) has reviewed patient. We will continue to monitor patient advancement through interdisciplinary progression rounds. If new patient transition needs arise, please place a TOC consult.

## 2021-10-26 NOTE — Progress Notes (Signed)
Mobility Specialist Progress Note   10/26/21 1110  Mobility  Activity Ambulated with assistance in hallway (in recliner before and after)  Level of Assistance Contact guard assist, steadying assist  Assistive Device Front wheel walker  Distance Ambulated (ft) 280 ft  Range of Motion/Exercises Active;All extremities  Activity Response Tolerated well   Patient received in recliner, eager to participate in mobility. Impulsively stood while Probation officer back was turned and had LOB, was able to regain balance upon leaning against window seal. Ambulated close min guard with slow steady gait. Returned to room without complaint or incident. Was left in recliner with all needs met, call bell in reach.   [name] Sahand Gosch, [place], [place]  Office: [phone]

## 2021-10-26 NOTE — Plan of Care (Signed)

## 2021-10-26 NOTE — Inpatient Diabetes Management (Addendum)
Inpatient Diabetes Program Recommendations  AACE/ADA: New Consensus Statement on Inpatient Glycemic Control (2015)  Target Ranges:  Prepandial:   less than 140 mg/dL      Peak postprandial:   less than 180 mg/dL (1-2 hours)      Critically ill patients:  140 - 180 mg/dL    Latest Reference Range & Units 12/25/20 11:03 05/01/21 13:32 08/31/21 12:19 10/25/21 07:04  Hemoglobin A1C 4.8 - 5.6 % 12.9 ! >15.0 11.7 ! 12.6 (H)  314 mg/dl    Latest Reference Range & Units 10/25/21 06:36 10/25/21 06:58 10/25/21 16:12 10/25/21 23:36  Glucose-Capillary 70 - 99 mg/dL  80 units Semglee '@1612'  10/14 58 (L) 79 127 (H)  80 units Semglee '@1657'  117 (H)    Latest Reference Range & Units 10/26/21 06:20 10/26/21 06:46  Glucose-Capillary 70 - 99 mg/dL 63 (L) 32    Admit with:  Acute Change in Mental Status  Acute CVA   History: DM, CKD2, CVA  Home DM Meds: Farxiga 10 mg daily        Toujeo 80 units QPM        Humalog 14 units TID with meals  Current Orders: Semglee 80 units daily   Endocrinologist: Dr. Kelton Pillar with [facility] Last seen 05/01/2021 Was told to take the following: Victoza 1.8 mg daily Farxiga 10 mg daily Lantus 70 units QPM Humalog 14 units TID with meals Humalog 1 unit for every 30 mg/dl above Target CBG of 130     MD- Note Hypoglycemia the last 2 mornings after receiving 80 units Semglee  No Novolog currently ordered  Please consider:  1. Reduce Semglee to 60 units Daily in the afternoon (75% home dose)  2. Start Novolog Very Sensitive Correction Scale/ SSI (0-6 units) TID AC + HS    Addendum 11:45am--Met w/ pt at bedside this AM to review home diabetes meds, current CBGs, Current A1c.  Pt verified she is supposed to be taking the following: Victoza 1.8 mg daily Farxiga 10 mg daily Lantus 70 units QPM Humalog 14 units TID with meals Humalog 1 unit for every 30 mg/dl above Target CBG of 130 Told me she is taking 80 units of Toujeo (not 70 units).  Pt told me  she is inconsistent with taking the Victoza injections daily as well as remembering to take the Humalog SSI that is also prescribed.  She said she is pretty good about taking the Toujeo 70 units daily and the Humalog 14 units with meals but often forgets to add the Correction Humalog.  Stated to me she knows she needs to take better care of her diabetes.  Told me she has an upcoming appt with her ENDO Dr. Kelton Pillar in January.  We discussed her current A1c of 12.6% and how that has increased from 11.7% back in August.  Pt told me she has Dexcom CGM to monitor her CBGs.  We discussed the importance of better CBG control to prevent further complications like another CVA, MI, heart disease, kidney disease, etc.  I reviewed with pt her current in-hospital CBG levels and insulin regimen.  Pt appreciative of visit and told me the MD will be sending her to inpatient Rehab before she goes home.       --Will follow patient during hospitalization--  Wyn Quaker RN, MSN, [place] Diabetes Coordinator Inpatient Glycemic Control Team Team Pager: [phone] (8a-5p)

## 2021-10-26 NOTE — Evaluation (Signed)
Speech Language Pathology Evaluation Patient Details Name: Sarah [name] MRN: [id number] DOB: [birth year] Today's Date: 10/26/2021 Time: 9242-6834 SLP Time Calculation (min) (ACUTE ONLY): 15 min  Problem List:  Patient Active Problem List   Diagnosis Date Noted   Elevated troponin    Change in mental status 10/24/2021   Long term current use of antithrombotics/antiplatelets 10/02/2021   Heart murmur, systolic 19/62/2297   Dizziness 04/08/2021   Long term (current) use of antithrombotics/antiplatelets 12/16/2020   Chronic constipation 12/16/2020   Colon cancer screening 12/16/2020   History of adenomatous polyp of colon 12/16/2020   Coronary artery disease of native artery of native heart with stable angina pectoris ([place]) 03/04/2020   Diabetes mellitus ([place]) 03/03/2020   Aortic atherosclerosis ([place]) 02/12/2020   Bilateral leg cramps 01/21/2020   Bilateral lower extremity edema 01/21/2020   Anxiety disorder 01/21/2020   Type 2 diabetes mellitus with hyperglycemia, with long-term current use of insulin ([place]) 08/15/2019   Type 2 diabetes mellitus with diabetic polyneuropathy, with long-term current use of insulin ([place]) 08/15/2019   Uncontrolled type 2 diabetes mellitus with hypoglycemia without coma ([place]) 08/15/2019   Hyperlipidemia LDL goal <70 08/15/2019   Bilateral carpal tunnel syndrome 03/27/2019   Osteoarthritis of both knees 11/27/2018   CKD (chronic kidney disease), stage II 11/27/2018   GERD (gastroesophageal reflux disease) 06/26/2018   Homonymous hemianopia, left 02/08/2018   Cerebrovascular accident (CVA) due to embolism of cerebral artery ([place])    Diabetes mellitus with coincident hypertension ([place])    Trigger finger, right index finger 10/17/2017   Low back pain 07/29/2017   Carotid artery stenosis 11/02/2016   Left arm weakness    Diastolic dysfunction    Hypertension with heart disease    Acute blood loss anemia    Cerebral infarction due to embolism of  right middle cerebral artery ([place])    Perirectal abscess 09/26/2015   Vitamin D deficiency 07/23/2015   Osteopenia 02/16/2013   Ankle fracture 01/19/2013   Anemia 09/27/2011   Type II diabetes mellitus with nephropathy ([place]) 09/24/2011   Mixed hyperlipidemia 09/24/2011   Primary hypertension 09/24/2011   Past Medical History:  Past Medical History:  Diagnosis Date   Bilateral carpal tunnel syndrome 03/27/2019   Boils    Glaucoma    Heart murmur    HTN (hypertension)    Hx of adenomatous colonic polyps    Hyperlipidemia    Osteoporosis    Pneumonia    Stroke ([place])    Type II or unspecified type diabetes mellitus without mention of complication, uncontrolled    Past Surgical History:  Past Surgical History:  Procedure Laterality Date   ABDOMINAL HYSTERECTOMY     CARPAL TUNNEL RELEASE     right   COLONOSCOPY  12-10-10   per Dr. Deatra Ina, clear, repeat in 7 yrs    CORONARY STENT INTERVENTION N/A 03/11/2020   Procedure: CORONARY STENT INTERVENTION;  Surgeon: Jettie Booze, MD;  Location: [facility];  Service: Cardiovascular;  Laterality: N/A;   INCISION AND DRAINAGE PERIRECTAL ABSCESS N/A 09/26/2015   Procedure: IRRIGATION AND DEBRIDEMENT PERIRECTAL ABSCESS;  Surgeon: Mickeal Skinner, MD;  Location: [facility];  Service: General;  Laterality: N/A;   INTRAVASCULAR ULTRASOUND/IVUS N/A 03/11/2020   Procedure: Intravascular Ultrasound/IVUS;  Surgeon: Jettie Booze, MD;  Location: [facility];  Service: Cardiovascular;  Laterality: N/A;   KNEE ARTHROSCOPY     right   LEFT HEART CATH AND CORONARY ANGIOGRAPHY N/A 03/11/2020   Procedure: LEFT  HEART CATH AND CORONARY ANGIOGRAPHY;  Surgeon: Jettie Booze, MD;  Location: [facility];  Service: Cardiovascular;  Laterality: N/A;   LOOP RECORDER INSERTION N/A 10/19/2016   Procedure: LOOP RECORDER INSERTION;  Surgeon: Thompson Grayer, MD;  Location: [facility];  Service: Cardiovascular;  Laterality: N/A;    ORIF ANKLE FRACTURE Right 03/24/2012   Procedure: OPEN REDUCTION INTERNAL FIXATION (ORIF) ANKLE FRACTURE;  Surgeon: Newt Minion, MD;  Location: [facility];  Service: Orthopedics;  Laterality: Right;  Open Reduction Internal Fixation Right Bimalleolar ankle fracture   POLYPECTOMY     TEE WITHOUT CARDIOVERSION N/A 10/18/2016   Procedure: TRANSESOPHAGEAL ECHOCARDIOGRAM (TEE);  Surgeon: Fay Records, MD;  Location: [facility];  Service: Cardiovascular;  Laterality: N/A;   TONSILLECTOMY     HPI:  Pt is a 76 yo female who presented 10/13 with AMS. MRI brain: punctate left cerebellum and left parietal infarcts. PMH: DM II, HTN, loop recorder, ORIF right ankle 2014, and CVA x2 (R MCA CVA in 2018 and R parieto-occipital CVA in 2020).   Assessment / Plan / Recommendation Clinical Impression  Pt participated in speech-language-cognition evaluation. Pt reported that she has an associate's degree in business and lives with her mother. Per the pt, her mother is mostly independent, but she has been managing both of their medications. Pt denied any baseline deficits in speech, language, or cognition. She initially denied any acute changes, but after the evaluation she stated that her thinking is different than at baseline. [facility] Mental Status Examination was completed to evaluate the pt's cognitive-linguistic skills. She achieved a score of 13/30 which is below the normal limits of 27 or more out of 30. She exhibited difficulty in the areas of awareness, attention, memory, problem solving and executive function. Motor speech and language skills were [facility]. Skilled SLP services are clinically indicated at this time to improve cognitive-linguistic function.    SLP Assessment  SLP Recommendation/Assessment: Patient needs continued Speech [place] Pathology Services SLP Visit Diagnosis: Cognitive communication deficit (R41.841)    Recommendations for follow up therapy are one component of a  multi-disciplinary discharge planning process, led by the attending physician.  Recommendations may be updated based on patient status, additional functional criteria and insurance authorization.    Follow Up Recommendations  Acute inpatient rehab (3hours/day)    Assistance Recommended at Discharge  Intermittent Supervision/Assistance  Functional Status Assessment Patient has had a recent decline in their functional status and demonstrates the ability to make significant improvements in function in a reasonable and predictable amount of time.  Frequency and Duration min 2x/week  2 weeks      SLP Evaluation Cognition  Overall Cognitive Status: Impaired/Different from baseline Arousal/Alertness: Awake/alert Orientation Level: Oriented X4 Year: 2023 Day of Week: Correct Attention: Focused;Sustained Focused Attention: Appears intact Sustained Attention: Impaired Sustained Attention Impairment: Verbal complex Memory: Impaired Memory Impairment:  (5/5 with repetition x4; delayed: 2/5; with cues: 3/3) Awareness: Impaired Awareness Impairment: Emergent impairment Problem Solving: Impaired Problem Solving Impairment: Verbal complex (time: 0/1; money: 0/3) Executive Function: Sequencing;Organizing Sequencing: Impaired Sequencing Impairment: Verbal complex (clock: 0/4) Organizing: Impaired Organizing Impairment: Verbal complex (backward digit span: 1/2)       Comprehension  Auditory Comprehension Overall Auditory Comprehension: Appears within functional limits for tasks assessed Yes/No Questions: Within Functional Limits Commands: Within Functional Limits Conversation: Complex Interfering Components: Working memory;Processing speed    Expression Expression Primary Mode of Expression: Verbal Verbal Expression Overall Verbal Expression: Appears within functional limits  for tasks assessed Initiation: No impairment Level of Generative/Spontaneous Verbalization:  Conversation Repetition: No impairment Naming: No impairment Pragmatics: No impairment   Oral / Motor  Oral Motor/Sensory Function Overall Oral Motor/Sensory Function: Within functional limits Motor Speech Overall Motor Speech: Appears within functional limits for tasks assessed Respiration: Within functional limits Phonation: Normal Resonance: Within functional limits Articulation: Within functional limitis Intelligibility: Intelligible Motor Planning: Witnin functional limits           [place] Muzquiz I. Hardin Negus, [place], [place] Office number [phone]  Horton Marshall 10/26/2021, 2:27 PM

## 2021-10-26 NOTE — Progress Notes (Signed)
Inpatient Rehab Coordinator Note:  I met with patient at bedside to discuss CIR recommendations and goals/expectations of CIR stay.  (Spoke with her sister [name] by phone) We reviewed 3 hrs/day of therapy, physician follow up, and average length of stay 2 weeks (dependent upon progress) with goals of mod I.  Patient and sister are interested in pursuing CIR as an option for continued therapy. Sister verified that she is coming to stay for 2 weeks and has another sister in town that can assist as needed. Will open case with insurance this morning.   Rehab Admissons Coordinator [place], [place], [place] [phone]

## 2021-10-26 NOTE — Progress Notes (Signed)
Rounding Note    Patient Name: Sarah [name] Date of Encounter: 10/26/2021  [facility] Cardiologist: Werner Lean, MD   Subjective   Denies chest pain or dyspnea  Inpatient Medications    Scheduled Meds:  aspirin EC  81 mg Oral Daily   atorvastatin  80 mg Oral Daily   clopidogrel  75 mg Oral Daily   ezetimibe  10 mg Oral Daily   heparin  5,000 Units Subcutaneous Q8H   insulin aspart  0-5 Units Subcutaneous QHS   insulin aspart  0-6 Units Subcutaneous TID WC   insulin glargine-yfgn  60 Units Subcutaneous Q1500   losartan  25 mg Oral Daily   Continuous Infusions:  PRN Meds: acetaminophen, nitroGLYCERIN, ondansetron (ZOFRAN) IV   Vital Signs    Vitals:   10/25/21 1955 10/26/21 0431 10/26/21 0500 10/26/21 0817  BP: (!) 133/115 (!) 174/88  (!) 161/76  Pulse: 84 84  86  Resp: '18 19  16  '$ Temp: 98.4 F (36.9 C) 97.9 F (36.6 C)  98.2 F (36.8 C)  TempSrc: Oral Oral  Oral  SpO2: 99% 96%  98%  Weight:   54 kg   Height:        Intake/Output Summary (Last 24 hours) at 10/26/2021 0916 Last data filed at 10/26/2021 0400 Gross per 24 hour  Intake 510 ml  Output --  Net 510 ml      10/26/2021    5:00 AM 10/25/2021    4:42 AM 10/23/2021    3:52 PM  Last 3 Weights  Weight (lbs) 119 lb 0.8 oz 117 lb 1 oz 121 lb 0.5 oz  Weight (kg) 54 kg 53.1 kg 54.9 kg      Telemetry    SR - Personally Reviewed  ECG    No new EKG - Personally Reviewed  Physical Exam   GEN: No acute distress.   Neck: No JVD Cardiac: RRR, no murmurs, rubs, or gallops.  Respiratory: Clear to auscultation bilaterally. GI: Soft, nontender, non-distended  MS: No edema; No deformity. Neuro:  alert and oriented Psych: Normal affect   Labs    High Sensitivity Troponin:   Recent Labs  Lab 10/23/21 1941 10/23/21 2306 10/25/21 0704  TROPONINIHS 230* 268* 166*     Chemistry Recent Labs  Lab 10/25/21 0706 10/25/21 1620 10/26/21 0445  NA 139 136 139  K  2.6* 3.6 3.4*  CL 106 100 108  CO2 '26 27 24  '$ GLUCOSE 88 118* 62*  BUN '17 17 22  '$ CREATININE 1.01* 1.03* 0.96  CALCIUM 8.2* 7.8* 8.0*  MG  --   --  2.0  ALBUMIN 1.6*  --  1.6*  GFRNONAA 58* 57* >60  ANIONGAP '7 9 7    '$ Lipids  Recent Labs  Lab 10/24/21 0512  CHOL 362*  TRIG 210*  HDL 93  LDLCALC 227*  CHOLHDL 3.9    Hematology Recent Labs  Lab 10/23/21 1941 10/24/21 0511 10/25/21 0704  WBC 11.5* 11.1* 8.0  RBC 4.23 3.82* 3.66*  HGB 13.0 11.9* 11.2*  HCT 39.7 35.2* 34.1*  MCV 93.9 92.1 93.2  MCH 30.7 31.2 30.6  MCHC 32.7 33.8 32.8  RDW 13.2 13.1 13.0  PLT 415* 343 352   Thyroid  Recent Labs  Lab 10/24/21 0511  TSH 1.591    BNPNo results for input(s): "BNP", "PROBNP" in the last 168 hours.  DDimer No results for input(s): "DDIMER" in the last 168 hours.   Radiology    EEG  adult  Result Date: 10/24/2021 Derek Jack, MD     10/24/2021  8:00 PM Routine EEG Report LAIYLAH ROETTGER is a 76 y.o. female with a history of altered mental status who is undergoing an EEG to evaluate for seizures. Report: This EEG was acquired with electrodes placed according to the International 10-20 electrode system (including Fp1, Fp2, F3, F4, C3, C4, P3, P4, O1, O2, T3, T4, T5, T6, A1, A2, Fz, Cz, Pz). The following electrodes were missing or displaced: none. The occipital dominant rhythm was 8.5 Hz. This activity is reactive to stimulation. Drowsiness was manifested by background fragmentation; deeper stages of sleep were identified by K complexes and sleep spindles. There was no focal slowing. There were no interictal epileptiform discharges. There were no electrographic seizures identified. Photic stimulation and hyperventilation were not performed. Impression: This EEG was obtained while awake and asleep and is normal.   Clinical Correlation: Normal EEGs, however, do not rule out epilepsy. Su Monks, MD [facility] [phone] If 7pm- 7am, please page neurology on  call as listed in [place].   MR ANGIO HEAD WO CONTRAST  Result Date: 10/24/2021 CLINICAL DATA:  Acute stroke suspected EXAM: MRA HEAD WITHOUT CONTRAST TECHNIQUE: Angiographic images of the Circle of Willis were acquired using MRA technique without intravenous contrast. COMPARISON:  Brain MRI earlier today and intracranial MRA 03/09/2019 FINDINGS: Anterior circulation: A few slices are affected by motion artifact, including at the level of the bilateral MCA. There is a bilateral M1 stenosis, at least moderate on the left and advanced on the right, accentuated by motion but chronic when compared to prior MRA. No evidence of aneurysm Posterior circulation: The vertebral and basilar arteries are smoothly contoured and widely patent. Mild atheromatous irregularity of the posterior cerebral arteries. IMPRESSION: 1. No emergent finding. Accounting for motion artifact, no convincing change from 2021. 2. Bilateral M1 segment stenosis, particularly advanced on the right where there has been prior MCA territory infarct. Electronically Signed   By: Jorje Guild M.D.   On: 10/24/2021 10:27    Cardiac Studies     Patient Profile     76 y.o. female with history of hypertension, remote R MCA stroke with residual partial left homonymous hemianopsia of unclear etiology, remote tobacco use history- quit smoking in 2018, osteoporosis, CAD s/p DES to RCA 03/2020 and DM2 presents with acute CVA  Assessment & Plan     Elevated troponin: 230 > 268.  Echo 10/14 with EF 60 to 65%, moderate LVH, G1 DD, normal RV function, mild MR.  Likely demand ischemia in setting of acute CVA.    Acute CVA: Followed by neurology, recommend DAPT x3 weeks then Plavix alone.  Plan 30-day monitor on discharge   [place] will sign off.   Medication Recommendations:  No changes Other recommendations (labs, testing, etc):  Plan 30 day monitor on discharge Follow up as an outpatient:  Scheduled for 12/08/21   For questions  or updates, please contact [place] Please consult [email or web address] for contact info under        Signed, Donato Heinz, MD  10/26/2021, 9:16 AM

## 2021-10-27 LAB — GLUCOSE, CAPILLARY
Glucose-Capillary: 53 mg/dL — ABNORMAL LOW (ref 70–99)
Glucose-Capillary: 103 mg/dL — ABNORMAL HIGH (ref 70–99)
Glucose-Capillary: 143 mg/dL — ABNORMAL HIGH (ref 70–99)

## 2021-10-27 LAB — BASIC METABOLIC PANEL
Anion gap: 8 (ref 5–15)
BUN: 23 mg/dL (ref 8–23)
CO2: 26 mmol/L (ref 22–32)
Calcium: 8.3 mg/dL — ABNORMAL LOW (ref 8.9–10.3)
Chloride: 108 mmol/L (ref 98–111)
Creatinine, Ser: 1.11 mg/dL — ABNORMAL HIGH (ref 0.44–1.00)
GFR, Estimated: 52 mL/min — ABNORMAL LOW (ref >60)
Glucose, Bld: 57 mg/dL — ABNORMAL LOW (ref 70–99)
Potassium: 3.8 mmol/L (ref 3.5–5.1)
Sodium: 142 mmol/L (ref 135–145)

## 2021-10-27 MED ORDER — INSULIN GLARGINE-YFGN 100 UNIT/ML ~~LOC~~ SOLN
40.0000 [IU] | Freq: Every day | SUBCUTANEOUS | Status: DC
  Administered 2021-10-27: 40 [IU] via SUBCUTANEOUS
  Filled 2021-10-27 (×2): qty 0.4

## 2021-10-27 NOTE — Inpatient Diabetes Management (Signed)
Inpatient Diabetes Program Recommendations  AACE/ADA: New Consensus Statement on Inpatient Glycemic Control (2015)  Target Ranges:  Prepandial:   less than 140 mg/dL      Peak postprandial:   less than 180 mg/dL (1-2 hours)      Critically ill patients:  140 - 180 mg/dL    Latest Reference Range & Units 10/26/21 06:20 10/26/21 06:46 10/26/21 11:24 10/26/21 16:51 10/26/21 21:40  Glucose-Capillary 70 - 99 mg/dL 63 (L) 93 220 (H)  2 units Novolog '@1316'$  208 (H)  2 units Novolog '@1810'$   60 units Semglee '@1810'$  199 (H)  (L): Data is abnormally low (H): Data is abnormally high  Latest Reference Range & Units 10/27/21 06:09 10/27/21 06:59  Glucose-Capillary 70 - 99 mg/dL 53 (L) 103 (H)  (L): Data is abnormally low (H): Data is abnormally high  Home DM Meds: Farxiga 10 mg daily                              Toujeo 80 units QPM                              Humalog 14 units TID with meals   Current Orders: Semglee 60 units daily     Novolog 0-6 units TID AC + HS      MD- Note Semglee reduced to 60 units yesterday afternoon and Novolog SSI added  Still Hypoglycemic this AM (CBG 53)  Please consider:  1. Reduce Semglee further to 40 units daily (50% home dose)  2. Increase the Novolog SSI to the 0-9 unit (sensitive) scale    Endocrinologist: Dr. Kelton Pillar with [facility] Last seen 05/01/2021 Was told to take the following: Victoza 1.8 mg daily Farxiga 10 mg daily Lantus 70 units QPM Humalog 14 units TID with meals Humalog 1 unit for every 30 mg/dl above Target CBG of 130    --Will follow patient during hospitalization--  Wyn Quaker RN, MSN, [place] Diabetes Coordinator Inpatient Glycemic Control Team Team Pager: [phone] (8a-5p)

## 2021-10-27 NOTE — Progress Notes (Signed)
Inpatient Rehab Admissions Coordinator:   Peer to peer was done by Dr. Horris Latino per Bernadene Bell request. Received secure chat that case was denied. Met with patient to discuss home with Home Health vs. Appealing denial. Patient was unable to make that decision. Spoke with her sister, [name] over phone and explained situation. [name] will be here tonight from out of state. My colleague will meet with [name] and patient in the am to decide on moving forward with appeal vs home with assist.   Rehab Admissons Coordinator [place], PT, [facility] [phone]

## 2021-10-27 NOTE — Progress Notes (Signed)
Physical Therapy Treatment Patient Details Name: Sarah [name] MRN: [id number] DOB: [birth year] Today's Date: 10/27/2021   History of Present Illness The pt is a 76 yo female presenting 10/13 with AMS. Upon work up, pt with elevated troponin, and MRI showed punctate left cerebellum and left parietal infarcts. PMH includes: DM II, HTN, loop recorder, ORIF right ankle 2014, and CVA x2 (R MCA CVA in 2018 and R parieto-occipital CVA in 2020).    PT Comments    Received pt sitting upright in bed and requesting to get washed up - agreeable to ambulate prior to washing up. Pt performed bed mobility with supervision and donned non-skid socks sitting EOB in figure four position with supervision. Pt stood from EOB without AD and supervision and ambulated in/out of bathroom - cues for RW safety and problem solving tight spaces with RW. Pt required assist for clothing management, but able to void and perform hygiene management without assist. Pt ambulated in hallway with RW and min guard (min cues for attention) and left sitting in recliner with all needs within reach. Set pt up with basin and washcloths to wash upper body and brush teeth. Acute PT to cont to follow.     Recommendations for follow up therapy are one component of a multi-disciplinary discharge planning process, led by the attending physician.  Recommendations may be updated based on patient status, additional functional criteria and insurance authorization.  Follow Up Recommendations  Acute inpatient rehab (3hours/day)     Assistance Recommended at Discharge Frequent or constant Supervision/Assistance  Patient can return home with the following A little help with bathing/dressing/bathroom;Assistance with cooking/housework;Direct supervision/assist for medications management;Direct supervision/assist for financial management;Assist for transportation;Help with stairs or ramp for entrance;A little help with walking and/or transfers    Equipment Recommendations  Rolling walker (2 wheels)    Recommendations for Other Services Rehab consult     Precautions / Restrictions Precautions Precautions: Fall Restrictions Weight Bearing Restrictions: No     Mobility  Bed Mobility Overal bed mobility: Needs Assistance Bed Mobility: Supine to Sit     Supine to sit: Supervision, HOB elevated       Patient Response: Cooperative  Transfers Overall transfer level: Needs assistance Equipment used: None Transfers: Sit to/from Stand Sit to Stand: Supervision           General transfer comment: pt stood from EOB without AD and supervision, despite therapist placing AD in front of her    Ambulation/Gait Ambulation/Gait assistance: Min guard Gait Distance (Feet): 120 Feet Assistive device: Rolling walker (2 wheels) Gait Pattern/deviations: Step-to pattern, Decreased step length - left, Narrow base of support, Step-through pattern, Decreased step length - right, Decreased stride length Gait velocity: decreased Gait velocity interpretation: <1.8 ft/sec, indicate of risk for recurrent falls   General Gait Details: pt with no LOB but did require cues for AD safety when navigating tight turns (ex: in/out of bathroom and around bed) as pt unable to problem solve how to turn RW sideways   Stairs             Wheelchair Mobility    Modified Rankin (Stroke Patients Only)       Balance Overall balance assessment: Needs assistance Sitting-balance support: No upper extremity supported, Feet supported Sitting balance-Leahy Scale: Good     Standing balance support: No upper extremity supported Standing balance-Leahy Scale: Fair Standing balance comment: able to maintain static standing balance EOB withou AD and close supervision  Cognition Arousal/Alertness: Awake/alert Behavior During Therapy: WFL for tasks assessed/performed Overall Cognitive Status:  Impaired/Different from baseline                         Following Commands: Follows one step commands consistently       General Comments: pt iitally unable to recall month, but then corrected herself to October        Exercises      General Comments General comments (skin integrity, edema, etc.): Pt pleasant and cooperative; requesting to get "washed up" upon therapist's entry      Pertinent Vitals/Pain Pain Assessment Pain Assessment: No/denies pain    Home Living                          Prior Function            PT Goals (current goals can now be found in the care plan section) Acute Rehab PT Goals Patient Stated Goal: return to independence and living with her mother at home PT Goal Formulation: With patient Time For Goal Achievement: 11/08/21 Potential to Achieve Goals: Good    Frequency    Min 4X/week      PT Plan Current plan remains appropriate    Co-evaluation              AM-PAC PT "6 Clicks" Mobility   Outcome Measure  Help needed turning from your back to your side while in a flat bed without using bedrails?: None Help needed moving from lying on your back to sitting on the side of a flat bed without using bedrails?: A Little Help needed moving to and from a bed to a chair (including a wheelchair)?: A Little Help needed standing up from a chair using your arms (e.g., wheelchair or bedside chair)?: A Little Help needed to walk in hospital room?: A Little Help needed climbing 3-5 steps with a railing? : A Lot 6 Click Score: 18    End of Session   Activity Tolerance: Patient tolerated treatment well Patient left: in chair;with call bell/phone within reach;with chair alarm set Nurse Communication: Mobility status PT Visit Diagnosis: Other abnormalities of gait and mobility (R26.89);Muscle weakness (generalized) (M62.81);Unsteadiness on feet (R26.81)     Time: 0947-0962 PT Time Calculation (min) (ACUTE ONLY): 17  min  Charges:  $Gait Training: 8-22 mins                     Sarah [name] PT, DPT  Sarah [name] 10/27/2021, 9:55 AM

## 2021-10-27 NOTE — Consult Note (Signed)
   [facility] Inpatient Consult   10/27/2021  Sarah [name] 05/19/1945 [id number]   [facility] [employer] [ACO] Patient:  [employer] Medicare   Primary Care Provider: [name], [name], MD, [place] [facility] [place], is a provider with a Care Management team for care coordination program for post hospital follow up needs.     Patient screened for length of stay hospitalization with noted extreme high risk score for unplanned readmission risk.  Reviewed to assess for potential [place] Management service needs for post hospital transition for readmission prevention needs.  Review of patient's medical record reveals patient had been working with [facility] LCSW for transportation.   2:52 pm met with patient at the bedside, patient up in recliner ordering her meals. Introduced self and reason for visit for post hospital care coordination needs. Spoke with patient regarding any ongoing needs for transportation as patient was working with [facility] LCSW on this prior to admission. Patient states she would like to have follow up as she got admitted during the interim. Gave patient an appointment reminder card for PCP with a 24 hour nurse advise line magnet and patient endorses PCP.  Patient verbalized understanding.   Plan:  Continue to follow progress and disposition to assess for post hospital care management needs.  Referral request for post hospital care coordination with [facility] team.    For questions contact:    Natividad Brood, RN BSN [place]  [phone] business mobile phone Toll free office [phone]  *[place]  [phone] Fax number: [phone] [email or web address]    .

## 2021-10-27 NOTE — TOC Progression Note (Signed)
Transition of Care [facility]) - Progression Note    Patient Details  Name: Sarah [name] MRN: [id number] Date of Birth: 05-12-1945  Transition of Care [facility]) CM/SW Contact  Pollie Friar, RN Phone Number: 10/27/2021, 1:25 PM  Clinical Narrative:    Continue to await insurance approval for a CIR admission.  Pt lives with her nephew and mother. She states her sister also comes by daily. Sister provides some transportation but she states she as missed a few appointments. She recently signed up for a transportation group. She hasn't been able to use it yet as she was hospitalized. She was agreeable to being entered into [facility] for further assistance.  Pt does admit that she has been noncompliant with her medications at home. She verbalizes that she knows she has to do better.  Pt would transport to CIR via wheelchair/ bed once approved.  TOC following.  SDOH Interventions    Flowsheet Row ED to Hosp-Admission (Current) from 10/23/2021 in [place] Progressive Care Office Visit from 10/07/2021 in [place]. [facility] Telephone from 09/25/2021 in [place] Coordination Clinical Support from 04/29/2021 in [place] at [place] from 04/18/2020 in [place] at [facility] from 11/07/2019 in [place] at [place] Interventions -- -- -- Intervention Not Indicated Intervention Not Indicated --  Housing Interventions -- -- -- Intervention Not Indicated Intervention Not Indicated --  Transportation Interventions Inpatient TOC, [id number] Referral Taxi Voucher Given [facility]AT ([place]), Other (Comment)  [Medicare] Intervention Not Indicated Intervention Not Indicated --  Depression Interventions/Treatment  -- -- -- -- -- Medication  Financial Strain Interventions -- -- -- Intervention Not  Indicated Intervention Not Indicated --  Physical Activity Interventions -- -- -- Intervention Not Indicated Intervention Not Indicated --  Stress Interventions -- -- -- Intervention Not Indicated Intervention Not Indicated --  Social Connections Interventions -- -- -- Intervention Not Indicated Intervention Not Indicated --         Expected Discharge Plan: IP Rehab Facility Barriers to Discharge: Continued Medical Work up  Expected Discharge Plan and Services Expected Discharge Plan: [place]   Discharge Planning Services: CM Consult Post Acute Care Choice: IP Rehab Living arrangements for the past 2 months: Single Family Home                                       Social Determinants of Health (SDOH) Interventions Transportation Interventions: Inpatient TOC, [id number] Referral  Readmission Risk Interventions     No data to display

## 2021-10-27 NOTE — Progress Notes (Signed)
PROGRESS NOTE  Sarah [name] SEG:[id number] DOB: 10/26/1945 DOA: 10/23/2021 PCP: [name], [name], MD  HPI/Recap of past 24 hours: Sarah [name] is a 76 y.o. female with medical history significant of type 2 DM, HTN, stroke, CKD II, CAD  s/p stent  1 year ago, presents to ED with change in [place].  Patient was found to have acute CVA and admitted for further management.    Today, patient denies any new complaints.  Had a peer to peer pending IV health request, case was denied.  Patient to decide if she is going to appeal versus discharge home with home health.     Assessment/Plan: Principal Problem:   Change in mental status Active Problems:   Primary hypertension   Cerebrovascular accident (CVA) due to embolism of cerebral artery ([place])   Uncontrolled type 2 diabetes mellitus with hypoglycemia without coma (HCC)   Elevated troponin   Acute infarct of the left cerebellum, left parietal lobe History of CVA, with prior loop recorder which showed no A-fib in 2018 MRI showed above MRA with no emergent finding Carotid Doppler unremarkable 2D echo with EF of 60 to 65% Neurology consulted, recommend aspirin and plavix for 3 weeks and then Plavix alone.  Recommend 30-day event monitor as an outpatient.  Outpatient follow-up PT/OT/SLP-recommend CIR, PEG tube. Peer to Peer done as per [facility] health request was denied, patient to decide if she is going to appeal versus discharge home with home health  Possible acute metabolic encephalopathy Resolved Of unclear etiology, possibly above  Hypertension Noncompliant BP somewhat stable Continue losartan gradually lowering BP, may add on as needed Continue to hold Lasix, hydralazine, Norvasc  Elevated troponin Likely 2/2 demand ischemia Currently chest pain-free Cardiology consulted, no further recs  Hyperlipidemia LDL 227, not compliance with meds Continue Lipitor, [facility] Outpatient lipid clinic  Diabetes mellitus type  2 Noted multiple hypoglycemic episodes (insulin adjusted as needed) A1c 12.6 SSI, Accu-Cheks, hypoglycemic protocol Diabetes coordinator  AKI Improving Daily BMP  Hypokalemia Replace as needed      Estimated body mass index is 23.25 kg/m as calculated from the following:   Height as of this encounter: 5' (1.524 m).   Weight as of this encounter: 54 kg.     Code Status: Full  Family Communication: None at bedside  Disposition Plan: Status is: Inpatient Remains inpatient appropriate because: Level of care      Consultants: Neurology Cardiology  Procedures: None  Antimicrobials: None  DVT prophylaxis: Heparin   Objective: Vitals:   10/27/21 0442 10/27/21 0804 10/27/21 1139 10/27/21 1542  BP: (!) 155/69 (!) 181/93 (!) 183/90 (!) 185/105  Pulse:  82 84 89  Resp: '13 17 17 14  '$ Temp:  (!) 97.5 F (36.4 C) 98.3 F (36.8 C) 98.2 F (36.8 C)  TempSrc:  Oral Oral Oral  SpO2:  98% 98% 100%  Weight:      Height:       No intake or output data in the 24 hours ending 10/27/21 1816  Filed Weights   10/23/21 1552 10/25/21 0442 10/26/21 0500  Weight: 54.9 kg 53.1 kg 54 kg    Exam: General: NAD  Cardiovascular: S1, S2 present Respiratory: CTAB Abdomen: Soft, nontender, nondistended, bowel sounds present Musculoskeletal: No bilateral pedal edema noted Skin: Normal Psychiatry: Normal mood  Neurology: Sensation and strength equal in all extremity    Data Reviewed: CBC: Recent Labs  Lab 10/23/21 1941 10/24/21 0511 10/25/21 0704  WBC 11.5* 11.1* 8.0  NEUTROABS 8.5*  --  5.3  HGB 13.0 11.9* 11.2*  HCT 39.7 35.2* 34.1*  MCV 93.9 92.1 93.2  PLT 415* 343 341   Basic Metabolic Panel: Recent Labs  Lab 10/24/21 0512 10/25/21 0706 10/25/21 1620 10/26/21 0445 10/27/21 0352  NA 136 139 136 139 142  K 3.3* 2.6* 3.6 3.4* 3.8  CL 100 106 100 108 108  CO2 '24 26 27 24 26  '$ GLUCOSE 368* 88 118* 62* 57*  BUN '16 17 17 22 23  '$ CREATININE 1.02* 1.01*  1.03* 0.96 1.11*  CALCIUM 8.1* 8.2* 7.8* 8.0* 8.3*  MG  --   --   --  2.0  --   PHOS  --  3.0  --  2.3*  --    GFR: Estimated Creatinine Clearance: 31.9 mL/min (A) (by C-G formula based on SCr of 1.11 mg/dL (H)). Liver Function Tests: Recent Labs  Lab 10/25/21 0706 10/26/21 0445  ALBUMIN 1.6* 1.6*   No results for input(s): "LIPASE", "AMYLASE" in the last 168 hours. Recent Labs  Lab 10/24/21 0455  AMMONIA 29   Coagulation Profile: No results for input(s): "INR", "PROTIME" in the last 168 hours. Cardiac Enzymes: No results for input(s): "CKTOTAL", "CKMB", "CKMBINDEX", "TROPONINI" in the last 168 hours. BNP (last 3 results) No results for input(s): "PROBNP" in the last 8760 hours. HbA1C: Recent Labs    10/25/21 0704  HGBA1C 12.6*   CBG: Recent Labs  Lab 10/26/21 1651 10/26/21 2140 10/27/21 0609 10/27/21 0659 10/27/21 1540  GLUCAP 208* 199* 53* 103* 143*   Lipid Profile: No results for input(s): "CHOL", "HDL", "LDLCALC", "TRIG", "CHOLHDL", "LDLDIRECT" in the last 72 hours.  Thyroid Function Tests: No results for input(s): "TSH", "T4TOTAL", "FREET4", "T3FREE", "THYROIDAB" in the last 72 hours.  Anemia Panel: No results for input(s): "VITAMINB12", "FOLATE", "FERRITIN", "TIBC", "IRON", "RETICCTPCT" in the last 72 hours.  Urine analysis:    Component Value Date/Time   COLORURINE YELLOW 10/23/2021 1929   APPEARANCEUR HAZY (A) 10/23/2021 1929   LABSPEC 1.001 (L) 10/23/2021 1929   PHURINE 6.0 10/23/2021 1929   GLUCOSEU >=500 (A) 10/23/2021 1929   GLUCOSEU >=1000 (A) 11/17/2020 1442   HGBUR SMALL (A) 10/23/2021 1929   BILIRUBINUR NEGATIVE 10/23/2021 1929   BILIRUBINUR neg 08/01/2019 0930   KETONESUR NEGATIVE 10/23/2021 1929   PROTEINUR 100 (A) 10/23/2021 1929   UROBILINOGEN 0.2 11/17/2020 1442   NITRITE NEGATIVE 10/23/2021 1929   LEUKOCYTESUR NEGATIVE 10/23/2021 1929   Sepsis Labs: '@LABRCNTIP'$ (procalcitonin:4,lacticidven:4)  ) Recent Results (from the past  240 hour(s))  Resp Panel by RT-PCR (Flu A&B, Covid) Anterior Nasal Swab     Status: None   Collection Time: 10/23/21  7:29 PM   Specimen: Anterior Nasal Swab  Result Value Ref Range Status   SARS Coronavirus 2 by RT PCR NEGATIVE NEGATIVE Final    Comment: (NOTE) SARS-CoV-2 target nucleic acids are NOT DETECTED.  The SARS-CoV-2 RNA is generally detectable in upper respiratory specimens during the acute phase of infection. The lowest concentration of SARS-CoV-2 viral copies this assay can detect is 138 copies/mL. A negative result does not preclude SARS-Cov-2 infection and should not be used as the sole basis for treatment or other patient management decisions. A negative result may occur with  improper specimen collection/handling, submission of specimen other than nasopharyngeal swab, presence of viral mutation(s) within the areas targeted by this assay, and inadequate number of viral copies(<138 copies/mL). A negative result must be combined with clinical observations, patient history, and epidemiological information. The expected result is Negative.  Fact  Sheet for Patients:  EntrepreneurPulse.com.au  Fact Sheet for Healthcare Providers:  IncredibleEmployment.be  This test is no t yet approved or cleared by the [place] FDA and  has been authorized for detection and/or diagnosis of SARS-CoV-2 by FDA under an Emergency Use Authorization (EUA). This EUA will remain  in effect (meaning this test can be used) for the duration of the COVID-19 declaration under Section 564(b)(1) of the Act, 21 U.S.C.section 360bbb-3(b)(1), unless the authorization is terminated  or revoked sooner.       Influenza A by PCR NEGATIVE NEGATIVE Final   Influenza B by PCR NEGATIVE NEGATIVE Final    Comment: (NOTE) The Xpert Xpress SARS-CoV-2/FLU/RSV plus assay is intended as an aid in the diagnosis of influenza from Nasopharyngeal swab specimens and should not  be used as a sole basis for treatment. Nasal washings and aspirates are unacceptable for Xpert Xpress SARS-CoV-2/FLU/RSV testing.  Fact Sheet for Patients: EntrepreneurPulse.com.au  Fact Sheet for Healthcare Providers: IncredibleEmployment.be  This test is not yet approved or cleared by the [place] FDA and has been authorized for detection and/or diagnosis of SARS-CoV-2 by FDA under an Emergency Use Authorization (EUA). This EUA will remain in effect (meaning this test can be used) for the duration of the COVID-19 declaration under Section 564(b)(1) of the Act, 21 U.S.C. section 360bbb-3(b)(1), unless the authorization is terminated or revoked.  Performed at [facility], [place] [address], [place], [place] [address]       Studies: No results found.  Scheduled Meds:  aspirin EC  81 mg Oral Daily   atorvastatin  80 mg Oral Daily   clopidogrel  75 mg Oral Daily   ezetimibe  10 mg Oral Daily   heparin  5,000 Units Subcutaneous Q8H   insulin aspart  0-5 Units Subcutaneous QHS   insulin aspart  0-6 Units Subcutaneous TID WC   insulin glargine-yfgn  40 Units Subcutaneous Q1500   losartan  25 mg Oral Daily    Continuous Infusions:   LOS: 3 days     Alma Friendly, MD [facility]  If 7PM-7AM, please contact night-coverage [email or web address] 10/27/2021, 6:16 PM

## 2021-10-27 NOTE — Progress Notes (Signed)
Occupational Therapy Treatment Patient Details Name: Sarah [name] MRN: [id number] DOB: Mar 29, 1945 Today's Date: 10/27/2021   History of present illness The pt is a 76 yo female presenting 10/13 with AMS. Upon work up, pt with elevated troponin, and MRI showed punctate left cerebellum and left parietal infarcts. PMH includes: DM II, HTN, loop recorder, ORIF right ankle 2014, and CVA x2 (R MCA CVA in 2018 and R parieto-occipital CVA in 2020).   OT comments  Pt completed mobility in room with min assist using RW, min cueing to recall use of RW when stopping to engage in ADLs as well as mgmt of RW throughout room obstacles.  She completes toileting with min guard, grooming with min assist due to 1 loss of balance when reaching outside base of support. Also engaged in IADL pill box test assessment: Pt failed the assessment, demonstrating poor attention, planning, mental flexibility, suboptimal search strategies, concrete thinking and the inability to multitask. Pt had a total of 34 errors, where more than 3 errors is considered a fail. Pt able to self correct errors when reviewing assessment, but when organizing and planning on her own she is unable to complete at this time as she placed multiple pills in the wrong time slots, placed too many pills or missed the entire medication.  Pt agreeable to have assist with this task at this time, but does report "I'd like to try on another day".  Will follow acutely, continue to recommend AIR.    Recommendations for follow up therapy are one component of a multi-disciplinary discharge planning process, led by the attending physician.  Recommendations may be updated based on patient status, additional functional criteria and insurance authorization.    Follow Up Recommendations  Acute inpatient rehab (3hours/day)    Assistance Recommended at Discharge Frequent or constant Supervision/Assistance  Patient can return home with the following  A little help  with walking and/or transfers;A little help with bathing/dressing/bathroom;Assistance with cooking/housework;Direct supervision/assist for medications management;Direct supervision/assist for financial management;Assist for transportation;Help with stairs or ramp for entrance   Equipment Recommendations  Other (comment) (RW)    Recommendations for Other Services      Precautions / Restrictions Precautions Precautions: Fall Restrictions Weight Bearing Restrictions: No       Mobility Bed Mobility               General bed mobility comments: OOB in recliner    Transfers Overall transfer level: Needs assistance Equipment used: None Transfers: Sit to/from Stand Sit to Stand: Min guard           General transfer comment: for safety, mild unsteadiness     Balance Overall balance assessment: Needs assistance Sitting-balance support: No upper extremity supported, Feet supported Sitting balance-Leahy Scale: Good     Standing balance support: Bilateral upper extremity supported, No upper extremity supported, During functional activity Standing balance-Leahy Scale: Fair Standing balance comment: B UE support dynamically but able to maintain 0 UE support during ADLs with min guard                           ADL either performed or assessed with clinical judgement   ADL Overall ADL's : Needs assistance/impaired     Grooming: Minimal assistance;Wash/dry hands;Standing Grooming Details (indicate cue type and reason): min guard but 1 loss of balance requiring min assist when reaching outside base of support when washing hands  Toilet Transfer: Min guard;Ambulation;Rolling walker (2 wheels)   Toileting- Clothing Manipulation and Hygiene: Min guard;Sit to/from stand       Functional mobility during ADLs: Minimal assistance;Rolling walker (2 wheels) General ADL Comments: cueing for safety with RW, RW mgmt and obstacle navigation in room.     Extremity/Trunk Assessment              Vision       Perception     Praxis      Cognition Arousal/Alertness: Awake/alert Behavior During Therapy: WFL for tasks assessed/performed Overall Cognitive Status: Impaired/Different from baseline Area of Impairment: Attention, Memory, Following commands, Safety/judgement, Awareness, Problem solving                   Current Attention Level: Sustained Memory: Decreased short-term memory, Decreased recall of precautions Following Commands: Follows one step commands consistently, Follows one step commands with increased time, Follows multi-step commands inconsistently Safety/Judgement: Decreased awareness of safety, Decreased awareness of deficits Awareness: Emergent Problem Solving: Slow processing, Requires verbal cues, Difficulty sequencing General Comments: pt with decreased recall of RW use several times during session, pill box test completed see below for details  General Comments: Assessed using the Pill Box Test. Pt failed the assessment, demonstrating poor planning, mental flexibility, suboptimal search strategies, concrete thinking and the inability to multitask. Pt had a total of 34 errors, where more than 3 errors is considered a fail.  Errors: One tablet 3x/day (yellow) - 18 errors (omission/misplacement) One tablet 2x/day with breakfast and dinner (green) - 7 errors (omission/misplacement) One tablet in the morning (Blue)- 0 errors (omission/misplacement) One tablet daily at bedtime (orange) - 7 errors(omission/misplacement) One tablet every other day (red) - 1 errors (omission/misplacement)   Number of misplaced movement errors (pills placed in incorrect compartment)- 27 Number of total errors (sum of omissions; misplacements) - 33 Total time to complete task (allowed 5 min) - 7 min  Pt voicing understanding pill box and agreeable to complete test.  She was able to read all medication directions allowed, seemed  to have good process of placing pill bottles to R once placing in pill box.  Once completed, noted pt missed 1 bottle of medication (which was placed on the R)--no bedtime pill placed.   She also placed all medications in the morning slot, but when questioned she reports correct answers of spacing the medication out throughout the day.      Exercises      Shoulder Instructions       General Comments Pt pleasant and cooperative; requesting to get "washed up" upon therapist's entry    Pertinent Vitals/ Pain       Pain Assessment Pain Assessment: No/denies pain  Home Living                                          Prior Functioning/Environment              Frequency  Min 2X/week        Progress Toward Goals  OT Goals(current goals can now be found in the care plan section)  Progress towards OT goals: Progressing toward goals  Acute Rehab OT Goals Patient Stated Goal: get better OT Goal Formulation: With patient Time For Goal Achievement: 11/08/21 Potential to Achieve Goals: Good  Plan Discharge plan remains appropriate;Frequency remains appropriate    Co-evaluation  AM-PAC OT "6 Clicks" Daily Activity     Outcome Measure   Help from another person eating meals?: None Help from another person taking care of personal grooming?: A Little Help from another person toileting, which includes using toliet, bedpan, or urinal?: A Little Help from another person bathing (including washing, rinsing, drying)?: A Little Help from another person to put on and taking off regular upper body clothing?: A Little Help from another person to put on and taking off regular lower body clothing?: A Little 6 Click Score: 19    End of Session Equipment Utilized During Treatment: Rolling walker (2 wheels)  OT Visit Diagnosis: Unsteadiness on feet (R26.81);Other abnormalities of gait and mobility (R26.89);Other symptoms and signs involving cognitive  function   Activity Tolerance Patient tolerated treatment well   Patient Left in chair;with call bell/phone within reach;with chair alarm set   Nurse Communication Mobility status        Time: 2563-8937 OT Time Calculation (min): 23 min  Charges: OT General Charges $OT Visit: 1 Visit OT Treatments $Self Care/Home Management : 23-37 mins  [place] [phone]   Sarah [name] 10/27/2021, 1:38 PM

## 2021-10-27 NOTE — Progress Notes (Signed)
Inpatient Rehab Admissions Coordinator:   Insurance is requesting peer to peer. Information sent to attending MD. Will continue to await insurance decision for potential admit to CIR.  Rehab Admissons Coordinator [place], [place], [place] [phone]

## 2021-10-27 NOTE — Care Management Important Message (Signed)
Important Message  Patient Details  Name: Sarah [name] MRN: [id number] Date of Birth: [birth year]   Medicare Important Message Given:  Yes     Orbie Pyo 10/27/2021, 2:41 PM

## 2021-10-28 ENCOUNTER — Other Ambulatory Visit: Payer: Self-pay | Admitting: Cardiology

## 2021-10-28 DIAGNOSIS — I4891 Unspecified atrial fibrillation: Secondary | ICD-10-CM

## 2021-10-28 DIAGNOSIS — I639 Cerebral infarction, unspecified: Secondary | ICD-10-CM

## 2021-10-28 DIAGNOSIS — G9340 Encephalopathy, unspecified: Secondary | ICD-10-CM

## 2021-10-28 DIAGNOSIS — Z794 Long term (current) use of insulin: Secondary | ICD-10-CM

## 2021-10-28 DIAGNOSIS — Z8673 Personal history of transient ischemic attack (TIA), and cerebral infarction without residual deficits: Secondary | ICD-10-CM

## 2021-10-28 DIAGNOSIS — I6340 Cerebral infarction due to embolism of unspecified cerebral artery: Secondary | ICD-10-CM

## 2021-10-28 DIAGNOSIS — E1142 Type 2 diabetes mellitus with diabetic polyneuropathy: Secondary | ICD-10-CM

## 2021-10-28 LAB — GLUCOSE, CAPILLARY
Glucose-Capillary: 53 mg/dL — ABNORMAL LOW (ref 70–99)
Glucose-Capillary: 126 mg/dL — ABNORMAL HIGH (ref 70–99)
Glucose-Capillary: 83 mg/dL (ref 70–99)
Glucose-Capillary: 178 mg/dL — ABNORMAL HIGH (ref 70–99)

## 2021-10-28 LAB — BASIC METABOLIC PANEL
Anion gap: 9 (ref 5–15)
BUN: 22 mg/dL (ref 8–23)
CO2: 28 mmol/L (ref 22–32)
Calcium: 9 mg/dL (ref 8.9–10.3)
Chloride: 106 mmol/L (ref 98–111)
Creatinine, Ser: 0.91 mg/dL (ref 0.44–1.00)
GFR, Estimated: >60 mL/min (ref >60)
Glucose, Bld: 55 mg/dL — ABNORMAL LOW (ref 70–99)
Potassium: 3.8 mmol/L (ref 3.5–5.1)
Sodium: 143 mmol/L (ref 135–145)

## 2021-10-28 MED ORDER — TOUJEO SOLOSTAR 300 UNIT/ML ~~LOC~~ SOPN: 20.0000 [IU] | PEN_INJECTOR | Freq: Every day | SUBCUTANEOUS | Status: DC

## 2021-10-28 MED ORDER — ASPIRIN 81 MG PO TBEC: 81.0000 mg | DELAYED_RELEASE_TABLET | Freq: Every day | ORAL | 0 refills | Status: AC

## 2021-10-28 MED ORDER — HUMALOG KWIKPEN 200 UNIT/ML ~~LOC~~ SOPN: 3.0000 [IU] | PEN_INJECTOR | Freq: Three times a day (TID) | SUBCUTANEOUS | Status: DC

## 2021-10-28 MED ORDER — INSULIN GLARGINE-YFGN 100 UNIT/ML ~~LOC~~ SOLN
20.0000 [IU] | Freq: Every day | SUBCUTANEOUS | Status: DC
  Administered 2021-10-28: 20 [IU] via SUBCUTANEOUS
  Filled 2021-10-28: qty 0.2

## 2021-10-28 NOTE — Progress Notes (Signed)
Physical Therapy Treatment Patient Details Name: Sarah [name] MRN: [id number] DOB: 05-23-1945 Today's Date: 10/28/2021   History of Present Illness The pt is a 76 yo female presenting 10/13 with AMS. Upon work up, pt with elevated troponin, and MRI showed punctate left cerebellum and left parietal infarcts. PMH includes: DM II, HTN, loop recorder, ORIF right ankle 2014, and CVA x2 (R MCA CVA in 2018 and R parieto-occipital CVA in 2020).    PT Comments    Pt progressing towards physical therapy goals. Was able to perform transfers and ambulation with gross min guard assist to supervision for safety with the rollator for support. Continue to recommend AIR level rehab at d/c to maximize functional independence and improve cognition, and decrease risk for falls. Per chart review, insurance has denied AIR. Pt will have adequate support at home and feel she will be able to manage with family support, however highly recommend constant supervision and to maximize outpatient therapy services (PT and OT possibly alternating days) as able if sister can provide transportation. If sister cannot provide transportation, will need [place] therapy follow up.    Recommendations for follow up therapy are one component of a multi-disciplinary discharge planning process, led by the attending physician.  Recommendations may be updated based on patient status, additional functional criteria and insurance authorization.  Follow Up Recommendations  Acute inpatient rehab (3hours/day)     Assistance Recommended at Discharge Frequent or constant Supervision/Assistance  Patient can return home with the following A little help with bathing/dressing/bathroom;Assistance with cooking/housework;Direct supervision/assist for medications management;Direct supervision/assist for financial management;Assist for transportation;Help with stairs or ramp for entrance;A little help with walking and/or transfers   Equipment  Recommendations  Rollator (4 wheels)    Recommendations for Other Services Rehab consult     Precautions / Restrictions Precautions Precautions: Fall Restrictions Weight Bearing Restrictions: No     Mobility  Bed Mobility               General bed mobility comments: Pt was received sitting up in the recliner.    Transfers Overall transfer level: Needs assistance Equipment used: None Transfers: Sit to/from Stand Sit to Stand: Supervision           General transfer comment: Pt demonstrating proper hand placement on seated surface for safety. No assist required.    Ambulation/Gait Ambulation/Gait assistance: Min guard, Supervision Gait Distance (Feet): 250 Feet Assistive device: Rolling walker (2 wheels), None, 1 person hand held assist, Rollator (4 wheels) Gait Pattern/deviations: Step-to pattern, Decreased step length - left, Narrow base of support, Step-through pattern, Decreased step length - right, Decreased stride length Gait velocity: decreased Gait velocity interpretation: 1.31 - 2.62 ft/sec, indicative of limited community ambulator   General Gait Details: Initially with RW for support. Pt was motivated to attempt without AD, and was able to ambulate with increased antalgic gait but no gros LOB. HHA provided until we got to the rehab gym where she trialed a rollator. With the rollator, pt was able to ambulat well with decreased gait deviations and no LOB. Pt reports feeling secure in the option to sit and rest if she needs it, however no seated rest break was required during session.   Stairs Stairs: Yes Stairs assistance: Min guard, Min assist Stair Management: Two rails, Step to pattern, Forwards Number of Stairs: 10 General stair comments: Min guard assist provided for ascent, and light min assist provided for safe descent as pt with more uncontrolled lower to step below. Pt was  able to manage the number of stairs well without any notable  dyspnea.   Wheelchair Mobility    Modified Rankin (Stroke Patients Only) Modified Rankin (Stroke Patients Only) Pre-Morbid Rankin Score: No significant disability Modified Rankin: Moderately severe disability     Balance Overall balance assessment: Needs assistance Sitting-balance support: No upper extremity supported, Feet supported Sitting balance-Leahy Scale: Good     Standing balance support: Bilateral upper extremity supported, No upper extremity supported, During functional activity Standing balance-Leahy Scale: Fair Standing balance comment: B UE support dynamically but able to maintain 0 UE support during ADLs with min guard                            Cognition Arousal/Alertness: Awake/alert Behavior During Therapy: WFL for tasks assessed/performed Overall Cognitive Status: Impaired/Different from baseline Area of Impairment: Attention, Memory, Following commands, Safety/judgement, Awareness, Problem solving                   Current Attention Level: Sustained Memory: Decreased short-term memory, Decreased recall of precautions Following Commands: Follows one step commands consistently, Follows one step commands with increased time, Follows multi-step commands inconsistently Safety/Judgement: Decreased awareness of safety, Decreased awareness of deficits Awareness: Emergent Problem Solving: Slow processing, Requires verbal cues, Difficulty sequencing General Comments: Missing steps during ADL activities, requiring cues for sequencing. Poor safety awareness and difficulty problem solving throughout session.        Exercises      General Comments        Pertinent Vitals/Pain Pain Assessment Pain Assessment: No/denies pain    Home Living Family/patient expects to be discharged to:: Private residence Living Arrangements: Parent;Other relatives Available Help at Discharge: Family;Available 24 hours/day Type of Home: House Home Access:  Stairs to enter   Entrance Stairs-Number of Steps: 1 Alternate Level Stairs-Number of Steps: 10 Home Layout: Two level Home Equipment: Shower seat;Grab bars - tub/shower Additional Comments: pt initially states level entry to 1 story house with tub shower, when confirming information at end of session pt states 1 step to enter and then 10 stairs to her bedroom upstairs with walk-in shower.    Prior Function            PT Goals (current goals can now be found in the care plan section) Acute Rehab PT Goals Patient Stated Goal: return to independence and living with her mother at home PT Goal Formulation: With patient Time For Goal Achievement: 11/08/21 Potential to Achieve Goals: Good Progress towards PT goals: Progressing toward goals    Frequency    Min 4X/week      PT Plan Current plan remains appropriate    Co-evaluation              AM-PAC PT "6 Clicks" Mobility   Outcome Measure  Help needed turning from your back to your side while in a flat bed without using bedrails?: None Help needed moving from lying on your back to sitting on the side of a flat bed without using bedrails?: A Little Help needed moving to and from a bed to a chair (including a wheelchair)?: A Little Help needed standing up from a chair using your arms (e.g., wheelchair or bedside chair)?: A Little Help needed to walk in hospital room?: A Little Help needed climbing 3-5 steps with a railing? : A Little 6 Click Score: 19    End of Session Equipment Utilized During Treatment: Gait belt Activity Tolerance: Patient tolerated  treatment well Patient left: in chair;with call bell/phone within reach;with chair alarm set Nurse Communication: Mobility status PT Visit Diagnosis: Other abnormalities of gait and mobility (R26.89);Muscle weakness (generalized) (M62.81);Unsteadiness on feet (R26.81)     Time: 6154-8845 PT Time Calculation (min) (ACUTE ONLY): 23 min  Charges:  $Gait Training: 23-37  mins                     Rolinda Roan, PT, DPT Acute Rehabilitation Services Secure Chat Preferred Office: [phone]    Thelma Comp 10/28/2021, 11:37 AM

## 2021-10-28 NOTE — Discharge Summary (Signed)
Physician Discharge Summary  Sarah [name] ZOX:[id number] DOB: 09-24-1945  PCP: [name], [name], MD  Admitted from: Home Discharged to: Home.  Admit date: 10/23/2021 Discharge date: 10/28/2021  Recommendations for Outpatient Follow-up:    Follow-up Information     Frann Rider, NP. Go on 11/04/2021.   Specialty: Neurology Why: stroke clinic Contact information: [address] [place] [address] [phone]         [name], [name], MD. Schedule an appointment as soon as possible for a visit in 1 week(s).   Specialty: Family Medicine Why: To be seen with repeat labs (CBC & BMP).  Needs to closely follow-up pertaining to her poorly controlled diabetes for further evaluation and medication adjustments. Contact information: [place] [address] [phone]         [place]. Schedule an appointment as soon as possible for a visit in 1 week(s).   Specialty: Rehabilitation Contact information: [address] [place] [phone] [place] Orders (From admission, onward)     Start     Ordered   10/28/21 [place]  At discharge       Question Answer Comment  To provide the following care/treatments PT   To provide the following care/treatments OT   To provide the following care/treatments SLP      10/28/21 1304             Equipment/Devices:     Durable Medical Equipment  (From admission, onward)           Start     Ordered   10/28/21 1136  For home use only DME 4 wheeled rolling walker with seat  Once       Question:  Patient needs a walker to treat with the following condition  Answer:  Stroke [facility])   10/28/21 1135             Discharge Condition: Improved and stable   Code Status: Full Code Diet recommendation:  Discharge Diet Orders (From admission, onward)      Start     Ordered   10/28/21 0000  Diet - low sodium heart healthy        10/28/21 1304   10/28/21 0000  Diet Carb Modified        10/28/21 1304             Discharge Diagnoses:  Principal Problem:   Change in mental status Active Problems:   Primary hypertension   Cerebrovascular accident (CVA) due to embolism of cerebral artery ([place])   Uncontrolled type 2 diabetes mellitus with hypoglycemia without coma ([place])   Elevated troponin   Brief Summary: 76 year old female, lives with her elderly mother, medical history significant for poorly controlled type II/IDDM, HTN, remote right MCA stroke with residual partial left homonymous hemianopsia of unclear etiology, remote tobacco use history, CAD s/p DES to RCA 03/2020, presented with acute encephalopathy with memory lapses and feeling unsteady/wobbly with walking.  Diagnosed with acute stroke.  Neurology and cardiology consulted.  Assessment and plan:  Acute ischemic stroke: Punctate left cerebellar and left parietal lobe infarctions, etiology not clear, cardioembolic pattern.  Has prior history of CVA. Neurology consulted and assisted with evaluation. As per neurology, above findings on MRI are likely an incidental finding on MRI during encephalopathy work-up. CT head: Chronic large  infarct in the distribution of the right MCA MRI brain: Punctate acute infarcts of the left cerebellum and left parietal lobe without hemorrhage or mass effect.  Old posterior right MCA territory infarct and punctate old right cerebellar infarct. MRA brain: No emergent finding.  Bilateral M1 segment stenosis, particularly advanced on the right at the location of her previous R MCA infarction. CUS: Unremarkable EEG normal TTE: LVEF 60-65% Loop recorder placed in the past without evidence of A-fib.  Will need 30-day event monitor as outpatient.  Patient's primary cardiologist is aware and I have also communicated with [place] heart care card master to  arrange this as outpatient. LDL 227 Hemoglobin A1c 12.6, up from 11.7 in August 2023. Was on Plavix 75 Mg daily prior to admission and as per neurology recommendations, will be discharged on aspirin 81 mg daily + Plavix 75 Mg daily x3 weeks followed by Plavix alone. Therapies had initially recommended CIR.  However CIR was declined by patient's insurance even after peer-to-peer review.  As per rehab coordinator follow-up today, patient has done better, no longer meets criteria for inpatient rehab and patient and family plan to get her home in between her siblings, they will be able to provide her with 24/7 care and assistance for several weeks.  Arrange for home health services and DME. Has outpatient follow-up with [facility] on 11/04/2021.  Acute encephalopathy, unclear etiology MRI brain as noted above. UA, RVP, chest x-ray negative.  Resolved and mental status back to baseline. ?  Related to 2 hyperglycemia No infectious etiology identified.  Essential hypertension: Permissive hypertension allowed. Resumed home dose of amlodipine, hydralazine, reduced dose furosemide and losartan at time of discharge.  Hyperlipidemia: LDL 227 despite multiple medications.  Continue home dose of density statin/atorvastatin and Zetia.  Recommend follow-up in the lipid clinic for further management-this can either be done through her PCPs office or cardiologist.  Poorly controlled type II DM/IDDM: Despite reportedly being on high-dose glargine and Humalog and Farxiga at home, A1c is 12.6.  Patient reportedly manages her insulin needs by herself.  Some concern for noncompliance versus insulin resistance. Despite reducing doses of Semglee from 18 > 60 > 40, still had hypoglycemic episode in the 50s this morning leading to the concern for noncompliance at home.  Discussed in detail with diabetes coordinator and plan to DC her home on even reduced dose of glargine at 20 units daily and Humalog at 3 units 3 times daily  with meals. Discussed in detail with patient and subsequently with sister via phone to check CBGs at least 3-4 times daily, log these carefully and follow-up with PCP for further medication adjustments.  They verbalized understanding. DM coordinator will further follow up to see if she can get freestyle libre for close monitoring.  Elevated troponin:  230 > 268.  TTE 10/14 with EF 60-65% and grade 1 diastolic dysfunction.  Likely demand ischemia in the setting of acute CVA, as per cardiology consultation.  Hypokalemia: Replaced.  Normocytic anemia: Outpatient follow-up.   Consultations: Neurology Cardiology  Procedures: None   Discharge Instructions  Discharge Instructions     (HEART FAILURE PATIENTS) Call MD:  Anytime you have any of the following symptoms: 1) 3 pound weight gain in 24 hours or 5 pounds in 1 week 2) shortness of breath, with or without a dry hacking cough 3) swelling in the hands, feet or stomach 4) if you have to sleep on extra pillows at night in order to breathe.   Complete by: As  directed    Ambulatory referral to Occupational Therapy   Complete by: As directed    Ambulatory referral to Physical Therapy   Complete by: As directed    Call MD for:   Complete by: As directed    Recurrent strokelike symptoms.  Recurrent low blood sugar/hypoglycemia   Call MD for:  difficulty breathing, headache or visual disturbances   Complete by: As directed    Call MD for:  extreme fatigue   Complete by: As directed    Call MD for:  persistant dizziness or light-headedness   Complete by: As directed    Call MD for:  persistant nausea and vomiting   Complete by: As directed    Call MD for:  severe uncontrolled pain   Complete by: As directed    Call MD for:  temperature >100.4   Complete by: As directed    Diet - low sodium heart healthy   Complete by: As directed    Diet Carb Modified   Complete by: As directed    Increase activity slowly   Complete by: As  directed         Medication List     TAKE these medications    amLODipine 5 MG tablet Commonly known as: NORVASC Take 1 tablet (5 mg total) by mouth daily.   aspirin EC 81 MG tablet Take 1 tablet (81 mg total) by mouth daily for 16 days. Swallow whole. Start taking on: October 29, 2021   atorvastatin 80 MG tablet Commonly known as: LIPITOR TAKE 1 TABLET(80 MG) BY MOUTH DAILY AT 6 PM What changed: See the new instructions.   clopidogrel 75 MG tablet Commonly known as: PLAVIX TAKE 1 TABLET(75 MG) BY MOUTH DAILY What changed: See the new instructions.   dapagliflozin propanediol 10 MG Tabs tablet Commonly known as: Farxiga Take 1 tablet (10 mg total) by mouth daily.   ezetimibe 10 MG tablet Commonly known as: ZETIA TAKE 1 TABLET(10 MG) BY MOUTH DAILY What changed: See the new instructions.   furosemide 20 MG tablet Commonly known as: LASIX TAKE 1 TABLET(20 MG) BY MOUTH DAILY What changed: See the new instructions.   gabapentin 100 MG capsule Commonly known as: NEURONTIN Take 1-2 capsules (100-200 mg total) by mouth at bedtime.   HumaLOG KwikPen 200 UNIT/ML KwikPen Generic drug: insulin lispro Inject 3 Units into the skin 3 (three) times daily with meals. What changed:  how much to take how to take this when to take this additional instructions   hydrALAZINE 10 MG tablet Commonly known as: APRESOLINE Take 1 tablet (10 mg total) by mouth 3 (three) times daily.   latanoprost 0.005 % ophthalmic solution Commonly known as: XALATAN Place 1 drop into both eyes at bedtime.   losartan 50 MG tablet Commonly known as: COZAAR TAKE 1 TABLET(50 MG) BY MOUTH DAILY What changed: See the new instructions.   Magnesium 400 MG Tabs Take 400 mg by mouth daily.   Na Sulfate-K Sulfate-Mg Sulf 17.5-3.13-1.6 GM/177ML Soln Commonly known as: Suprep Bowel Prep Kit Take 1 kit by mouth as directed.   nitroGLYCERIN 0.4 MG SL tablet Commonly known as: NITROSTAT Place 1  tablet (0.4 mg total) under the tongue every 5 (five) minutes as needed for chest pain. one tab every 5 minutes up to 3 tablets total over 15 minutes.   ondansetron 4 MG tablet Commonly known as: Zofran Take 1 tablet (4 mg total) by mouth every 8 (eight) hours as needed for nausea or vomiting.  sertraline 50 MG tablet Commonly known as: ZOLOFT Take 1 tablet (50 mg total) by mouth daily.   Toujeo SoloStar 300 UNIT/ML Solostar Pen Generic drug: insulin glargine (1 Unit Dial) Inject 20 Units into the skin daily in the afternoon. What changed: how much to take   traMADol 50 MG tablet Commonly known as: ULTRAM Take 1 tablet (50 mg total) by mouth daily as needed. What changed: reasons to take this       No Known Allergies    Procedures/Studies: VAS US CAROTID  Result Date: 10/26/2021 Carotid Arterial Duplex Study Patient Name:  Sarah [name]  Date of Exam:   10/24/2021 Medical Rec #: [id number]             Accession #:    [id number] Date of Birth: [birth year]             Patient Gender: F Patient Age:   76 years Exam Location:  [facility] Procedure:      VAS US CAROTID Referring Phys: Alferd Patee [facility] --------------------------------------------------------------------------------  Indications:       CVA. Risk Factors:      Hypertension, hyperlipidemia, Diabetes, coronary artery                    disease. Other Factors:     CKD II. Comparison Study:  Prior study done 04/24/21 indicating 1-39% ICA stenosis,                    bilaterally Performing Technologist: Sharion Dove RVS  Examination Guidelines: A complete evaluation includes B-mode imaging, spectral Doppler, color Doppler, and power Doppler as needed of all accessible portions of each vessel. Bilateral testing is considered an integral part of a complete examination. Limited examinations for reoccurring indications may be performed as noted.  Right Carotid Findings:  +----------+--------+--------+--------+------------------+------------------+           PSV cm/sEDV cm/sStenosisPlaque DescriptionComments           +----------+--------+--------+--------+------------------+------------------+ CCA Prox  96      16                                intimal thickening +----------+--------+--------+--------+------------------+------------------+ CCA Distal71      12                                intimal thickening +----------+--------+--------+--------+------------------+------------------+ ICA Prox  90      20              heterogenous                         +----------+--------+--------+--------+------------------+------------------+ ICA Mid   83      17                                                   +----------+--------+--------+--------+------------------+------------------+ ICA Distal61      19                                                   +----------+--------+--------+--------+------------------+------------------+ ECA  121     11                                                   +----------+--------+--------+--------+------------------+------------------+ +----------+--------+-------+--------+-------------------+           PSV cm/sEDV cmsDescribeArm Pressure (mmHG) +----------+--------+-------+--------+-------------------+ Subclavian114                                        +----------+--------+-------+--------+-------------------+ +---------+--------+--+--------+--+ VertebralPSV cm/s93EDV cm/s16 +---------+--------+--+--------+--+  Left Carotid Findings: +----------+--------+--------+--------+------------------+------------------+           PSV cm/sEDV cm/sStenosisPlaque DescriptionComments           +----------+--------+--------+--------+------------------+------------------+ CCA Prox  107     17                                intimal thickening  +----------+--------+--------+--------+------------------+------------------+ CCA Distal78      19                                intimal thickening +----------+--------+--------+--------+------------------+------------------+ ICA Prox  86      20              heterogenous                         +----------+--------+--------+--------+------------------+------------------+ ICA Mid   90      23                                                   +----------+--------+--------+--------+------------------+------------------+ ICA Distal81      25                                                   +----------+--------+--------+--------+------------------+------------------+ ECA       127     17                                                   +----------+--------+--------+--------+------------------+------------------+ +----------+--------+--------+--------+-------------------+           PSV cm/sEDV cm/sDescribeArm Pressure (mmHG) +----------+--------+--------+--------+-------------------+ [id number]                                          +----------+--------+--------+--------+-------------------+ +---------+--------+--+--------+--+--------+ VertebralPSV cm/s56EDV cm/s14Atypical +---------+--------+--+--------+--+--------+   Summary: Right Carotid: Velocities in the right ICA are consistent with a 1-39% stenosis. Left Carotid: Velocities in the left ICA are consistent with a 1-39% stenosis. Vertebrals:  Right vertebral artery demonstrates antegrade flow. Left vertebral              exhibits atypical waveform. Subclavians: Abnormal waveforms in bilateral subclavian. *See table(s) above for  measurements and observations.  Electronically signed by Antony Contras MD on 10/26/2021 at 10:41:50 AM.    Final    EEG adult  Result Date: 10/24/2021 Derek Jack, MD     10/24/2021  8:00 PM Routine EEG Report Sarah [name] is a 76 y.o. female with a history of  altered mental status who is undergoing an EEG to evaluate for seizures. Report: This EEG was acquired with electrodes placed according to the International 10-20 electrode system (including Fp1, Fp2, F3, F4, C3, C4, P3, P4, O1, O2, T3, T4, T5, T6, A1, A2, Fz, Cz, Pz). The following electrodes were missing or displaced: none. The occipital dominant rhythm was 8.5 Hz. This activity is reactive to stimulation. Drowsiness was manifested by background fragmentation; deeper stages of sleep were identified by K complexes and sleep spindles. There was no focal slowing. There were no interictal epileptiform discharges. There were no electrographic seizures identified. Photic stimulation and hyperventilation were not performed. Impression: This EEG was obtained while awake and asleep and is normal.   Clinical Correlation: Normal EEGs, however, do not rule out epilepsy. Su Monks, MD [facility] [phone] If 7pm- 7am, please page neurology on call as listed in [place].   ECHOCARDIOGRAM COMPLETE  Result Date: 10/24/2021    ECHOCARDIOGRAM REPORT   Patient Name:   Sarah [name] Date of Exam: 10/24/2021 Medical Rec #:  [id number]            Height:       60.0 in Accession #:    [id number]           Weight:       121.0 lb Date of Birth:  [birth year]            BSA:          1.508 m Patient Age:    83 years             BP:           177/77 mmHg Patient Gender: F                    HR:           74 bpm. Exam Location:  Inpatient Procedure: 2D Echo, Cardiac Doppler and Color Doppler Indications:    Stroke  History:        Patient has prior history of Echocardiogram examinations, most                 recent 04/27/2021. Pacemaker; Risk Factors:Diabetes,                 Hypertension, Dyslipidemia and Former Smoker. CKD. GERD.  Sonographer:    Clayton Lefort RDCS (AE) Referring Phys: Alferd Patee [facility] IMPRESSIONS  1. Left ventricular ejection fraction, by estimation, is 60 to 65%. The left ventricle has normal  function. The left ventricle has no regional wall motion abnormalities. There is moderate left ventricular hypertrophy. Left ventricular diastolic parameters are consistent with Grade I diastolic dysfunction (impaired relaxation).  2. Right ventricular systolic function is normal. The right ventricular size is normal. Tricuspid regurgitation signal is inadequate for assessing PA pressure.  3. The mitral valve is grossly normal. Mild mitral valve regurgitation. No evidence of mitral stenosis.  4. The aortic valve is tricuspid. There is mild calcification of the aortic valve. There is mild thickening of the aortic valve. Aortic valve regurgitation is trivial. Aortic valve sclerosis/calcification is present, without any evidence of aortic stenosis.  5. The  inferior vena cava is normal in size with greater than 50% respiratory variability, suggesting right atrial pressure of 3 mmHg. Conclusion(s)/Recommendation(s): No intracardiac source of embolism detected on this transthoracic study. Consider a transesophageal echocardiogram to exclude cardiac source of embolism if clinically indicated. Future echocardiograms should include global longitudinal strain assessment in setting of moderate LVH. FINDINGS  Left Ventricle: Left ventricular ejection fraction, by estimation, is 60 to 65%. The left ventricle has normal function. The left ventricle has no regional wall motion abnormalities. The left ventricular internal cavity size was normal in size. There is  moderate left ventricular hypertrophy. Left ventricular diastolic parameters are consistent with Grade I diastolic dysfunction (impaired relaxation). Right Ventricle: The right ventricular size is normal. No increase in right ventricular wall thickness. Right ventricular systolic function is normal. Tricuspid regurgitation signal is inadequate for assessing PA pressure. Left Atrium: Left atrial size was normal in size. Right Atrium: Right atrial size was normal in size.  Pericardium: Trivial pericardial effusion is present. Mitral Valve: Chordal SAM. The mitral valve is grossly normal. Mild mitral valve regurgitation. No evidence of mitral valve stenosis. Tricuspid Valve: The tricuspid valve is normal in structure. Tricuspid valve regurgitation is trivial. No evidence of tricuspid stenosis. Aortic Valve: The aortic valve is tricuspid. There is mild calcification of the aortic valve. There is mild thickening of the aortic valve. Aortic valve regurgitation is trivial. Aortic valve sclerosis/calcification is present, without any evidence of aortic stenosis. Aortic valve mean gradient measures 3.0 mmHg. Aortic valve peak gradient measures 5.4 mmHg. Aortic valve area, by VTI measures 2.58 cm. Pulmonic Valve: The pulmonic valve was normal in structure. Pulmonic valve regurgitation is trivial. No evidence of pulmonic stenosis. Aorta: The aortic root is normal in size and structure. Venous: The inferior vena cava is normal in size with greater than 50% respiratory variability, suggesting right atrial pressure of 3 mmHg. IAS/Shunts: No atrial level shunt detected by color flow Doppler.  LEFT VENTRICLE PLAX 2D LVIDd:         3.80 cm   Diastology LVIDs:         2.20 cm   LV e' medial:    4.35 cm/s LV PW:         1.10 cm   LV E/e' medial:  17.2 LV IVS:        1.40 cm   LV e' lateral:   6.20 cm/s LVOT diam:     1.70 cm   LV E/e' lateral: 12.1 LV SV:         59 LV SV Index:   39 LVOT Area:     2.27 cm  RIGHT VENTRICLE             IVC RV Basal diam:  1.90 cm     IVC diam: 1.60 cm RV S prime:     14.50 cm/s TAPSE (M-mode): 1.9 cm LEFT ATRIUM             Index        RIGHT ATRIUM          Index LA diam:        3.40 cm 2.25 cm/m   RA Area:     8.57 cm LA Vol (A2C):   46.3 ml 30.71 ml/m  RA Volume:   14.60 ml 9.68 ml/m LA Vol (A4C):   36.3 ml 24.07 ml/m LA Biplane Vol: 41.7 ml 27.66 ml/m  AORTIC VALVE AV Area (Vmax):    2.52 cm AV Area (Vmean):  2.54 cm AV Area (VTI):     2.58 cm AV Vmax:            116.00 cm/s AV Vmean:          77.700 cm/s AV VTI:            0.228 m AV Peak Grad:      5.4 mmHg AV Mean Grad:      3.0 mmHg LVOT Vmax:         129.00 cm/s LVOT Vmean:        87.000 cm/s LVOT VTI:          0.259 m LVOT/AV VTI ratio: 1.14  AORTA Ao Root diam: 3.10 cm Ao Asc diam:  3.00 cm MITRAL VALVE MV Area (PHT): 2.93 cm     SHUNTS MV Decel Time: 259 msec     Systemic VTI:  0.26 m MV E velocity: 74.90 cm/s   Systemic Diam: 1.70 cm MV A velocity: 103.00 cm/s MV E/A ratio:  0.73 Cherlynn Kaiser MD Electronically signed by Cherlynn Kaiser MD Signature Date/Time: 10/24/2021/11:29:16 AM    Final    MR ANGIO HEAD WO CONTRAST  Result Date: 10/24/2021 CLINICAL DATA:  Acute stroke suspected EXAM: MRA HEAD WITHOUT CONTRAST TECHNIQUE: Angiographic images of the Circle of Willis were acquired using MRA technique without intravenous contrast. COMPARISON:  Brain MRI earlier today and intracranial MRA 03/09/2019 FINDINGS: Anterior circulation: A few slices are affected by motion artifact, including at the level of the bilateral MCA. There is a bilateral M1 stenosis, at least moderate on the left and advanced on the right, accentuated by motion but chronic when compared to prior MRA. No evidence of aneurysm Posterior circulation: The vertebral and basilar arteries are smoothly contoured and widely patent. Mild atheromatous irregularity of the posterior cerebral arteries. IMPRESSION: 1. No emergent finding. Accounting for motion artifact, no convincing change from 2021. 2. Bilateral M1 segment stenosis, particularly advanced on the right where there has been prior MCA territory infarct. Electronically Signed   By: Jorje Guild M.D.   On: 10/24/2021 10:27   MR BRAIN WO CONTRAST  Result Date: 10/24/2021 CLINICAL DATA:  Altered mental status EXAM: MRI HEAD WITHOUT CONTRAST TECHNIQUE: Multiplanar, multiecho pulse sequences of the brain and surrounding structures were obtained without intravenous contrast.  COMPARISON:  03/09/2019 FINDINGS: Brain: Punctate acute infarcts of the left cerebellum and left parietal lobe. 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 No acute hemorrhage. There is multifocal hyperintense T2-weighted signal within the white matter. Generalized volume loss. Old posterior right MCA territory infarct and punctate old right cerebellar infarct. The midline structures are normal. Vascular: Major flow voids are preserved. Skull and upper cervical spine: Normal calvarium and skull base. Visualized upper cervical spine and soft tissues are normal. Sinuses/Orbits:No paranasal sinus fluid levels or advanced mucosal thickening. No mastoid or middle ear effusion. Normal orbits. IMPRESSION: 1. Punctate acute infarcts of the left cerebellum and left parietal lobe. No hemorrhage or mass effect. 2. Old posterior right MCA territory infarct and punctate old right cerebellar infarct. Electronically Signed   By: Ulyses Jarred M.D.   On: 10/24/2021 03:26   DG Chest Port 1 View  Result Date: 10/23/2021 CLINICAL DATA:  Chest pain. EXAM: PORTABLE CHEST 1 VIEW COMPARISON:  Radiograph 10/16/2016 FINDINGS: The cardiomediastinal contours are normal. Loop recorder in the left chest wall. Pulmonary vasculature is normal. No consolidation, pleural effusion, or pneumothorax. No acute osseous abnormalities are  seen. IMPRESSION: No acute chest findings. Electronically Signed   By: Keith Rake M.D.   On: 10/23/2021 23:32   CT Head Wo Contrast  Result Date: 10/23/2021 CLINICAL DATA:  Transient ischemic attack. Frontal headaches for 2 days. EXAM: CT HEAD WITHOUT CONTRAST TECHNIQUE: Contiguous axial images were obtained from the base of the skull through the vertex without intravenous contrast. RADIATION DOSE REDUCTION: This exam was performed according to the departmental dose-optimization program which includes automated exposure control, adjustment of the  mA and/or kV according to patient size and/or use of iterative reconstruction technique. COMPARISON:  MRI 03/09/2019.  CT 02/08/2018 FINDINGS: Brain: There is a large area of encephalomalacia involving the right frontal, temporal, and parietal regions. This is consistent with infarct. The infarct has progressed since prior MRI and CT studies. No mass-effect or midline shift. No abnormal extra-axial fluid collections. Basal cisterns are not effaced. Mild cerebral atrophy. Ventricular dilatation consistent with central atrophy. Low-attenuation changes in the deep white matter consistent small vessel ischemic change. No acute intracranial hemorrhage. Vascular: Intracranial arterial calcifications. Skull: Calvarium appears intact. Sinuses/Orbits: Paranasal sinuses and mastoid air cells are clear. Other: None. IMPRESSION: 1. 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 2. No acute intracranial hemorrhage or mass effect. 3. Chronic atrophy and small vessel ischemic changes. Electronically Signed   By: Lucienne Capers M.D.   On: 10/23/2021 20:46   DG Lumbar Spine Complete  Result Date: 10/01/2021 CLINICAL DATA:  Lower back pain after fall 1 week ago. EXAM: LUMBAR SPINE - COMPLETE 4+ VIEW COMPARISON:  None Available. FINDINGS: There is no evidence of lumbar spine fracture. Alignment is normal. Intervertebral disc spaces are maintained. Mild anterior osteophyte formation is noted at L2-3 and L3-4. IMPRESSION: Mild multilevel degenerative changes are noted. No acute abnormality seen. Electronically Signed   By: Marijo Conception M.D.   On: 10/01/2021 16:44      Subjective: Mild headache, better with Tylenol.  Denies any other complaints.  Discharge Exam:  Vitals:   10/27/21 2311 10/28/21 0420 10/28/21 0759 10/28/21 1135  BP: (!) 144/71 (!) 166/142 (!) 144/92 (!) 163/81  Pulse: 93 83 85 82  Resp: '18 16 17 18  ' Temp: 98.3  F (36.8 C) 98.1 F (36.7 C)  98.9 F (37.2 C)  TempSrc:  Oral  Oral  SpO2:  100% 98% 99%  Weight:      Height:        General: Elderly female, moderately built and frail sitting up comfortably in reclining chair this morning.  Getting ready to speak on the telephone. Cardiovascular: S1 & S2 heard, RRR, S1/S2 +. No murmurs, rubs, gallops or clicks. No JVD or pedal edema.  Telemetry personally reviewed: Sinus rhythm. Respiratory: Clear to auscultation without wheezing, rhonchi or crackles. No increased work of breathing. Abdominal:  Non distended, non tender & soft. No organomegaly or masses appreciated. Normal bowel sounds heard. CNS: Alert and oriented. No focal deficits. Extremities: no edema, no cyanosis    The results of significant diagnostics from this hospitalization (including imaging, microbiology, ancillary and laboratory) are listed below for reference.     Microbiology: Recent Results (from the past 240 hour(s))  Resp Panel by RT-PCR (Flu A&B, Covid) Anterior Nasal Swab     Status: None   Collection Time: 10/23/21  7:29 PM   Specimen: Anterior Nasal Swab  Result Value Ref Range Status   SARS Coronavirus 2 by RT PCR  NEGATIVE NEGATIVE Final    Comment: (NOTE) SARS-CoV-2 target nucleic acids are NOT DETECTED.  The SARS-CoV-2 RNA is generally detectable in upper respiratory specimens during the acute phase of infection. The lowest concentration of SARS-CoV-2 viral copies this assay can detect is 138 copies/mL. A negative result does not preclude SARS-Cov-2 infection and should not be used as the sole basis for treatment or other patient management decisions. A negative result may occur with  improper specimen collection/handling, submission of specimen other than nasopharyngeal swab, presence of viral mutation(s) within the areas targeted by this assay, and inadequate number of viral copies(<138 copies/mL). A negative result must be combined with clinical  observations, patient history, and epidemiological information. The expected result is Negative.  Fact Sheet for Patients:  EntrepreneurPulse.com.au  Fact Sheet for Healthcare Providers:  IncredibleEmployment.be  This test is no t yet approved or cleared by the [place] FDA and  has been authorized for detection and/or diagnosis of SARS-CoV-2 by FDA under an Emergency Use Authorization (EUA). This EUA will remain  in effect (meaning this test can be used) for the duration of the COVID-19 declaration under Section 564(b)(1) of the Act, 21 U.S.C.section 360bbb-3(b)(1), unless the authorization is terminated  or revoked sooner.       Influenza A by PCR NEGATIVE NEGATIVE Final   Influenza B by PCR NEGATIVE NEGATIVE Final    Comment: (NOTE) The Xpert Xpress SARS-CoV-2/FLU/RSV plus assay is intended as an aid in the diagnosis of influenza from Nasopharyngeal swab specimens and should not be used as a sole basis for treatment. Nasal washings and aspirates are unacceptable for Xpert Xpress SARS-CoV-2/FLU/RSV testing.  Fact Sheet for Patients: EntrepreneurPulse.com.au  Fact Sheet for Healthcare Providers: IncredibleEmployment.be  This test is not yet approved or cleared by the [place] FDA and has been authorized for detection and/or diagnosis of SARS-CoV-2 by FDA under an Emergency Use Authorization (EUA). This EUA will remain in effect (meaning this test can be used) for the duration of the COVID-19 declaration under Section 564(b)(1) of the Act, 21 U.S.C. section 360bbb-3(b)(1), unless the authorization is terminated or revoked.  Performed at [facility], [place] [address] [place], [place] [address]      Labs: CBC: Recent Labs  Lab 10/23/21 1941 10/24/21 0511 10/25/21 0704  WBC 11.5* 11.1* 8.0  NEUTROABS 8.5*  --  5.3  HGB 13.0 11.9* 11.2*  HCT 39.7 35.2* 34.1*  MCV 93.9 92.1 93.2   PLT 415* 343 226    Basic Metabolic Panel: Recent Labs  Lab 10/25/21 0706 10/25/21 1620 10/26/21 0445 10/27/21 0352 10/28/21 0404  NA 139 136 139 142 143  K 2.6* 3.6 3.4* 3.8 3.8  CL 106 100 108 108 106  CO2 '26 27 24 26 28  ' GLUCOSE 88 118* 62* 57* 55*  BUN '17 17 22 23 22  ' CREATININE 1.01* 1.03* 0.96 1.11* 0.91  CALCIUM 8.2* 7.8* 8.0* 8.3* 9.0  MG  --   --  2.0  --   --   PHOS 3.0  --  2.3*  --   --     Liver Function Tests: Recent Labs  Lab 10/25/21 0706 10/26/21 0445  ALBUMIN 1.6* 1.6*    CBG: Recent Labs  Lab 10/27/21 1540 10/28/21 0609 10/28/21 0658 10/28/21 0810 10/28/21 1201  GLUCAP 143* 53* 126* 83 178*     Urinalysis    Component Value Date/Time   COLORURINE YELLOW 10/23/2021 1929   APPEARANCEUR HAZY (A) 10/23/2021 1929   LABSPEC  1.001 (L) 10/23/2021 1929   PHURINE 6.0 10/23/2021 1929   GLUCOSEU >=500 (A) 10/23/2021 1929   GLUCOSEU >=1000 (A) 11/17/2020 1442   HGBUR SMALL (A) 10/23/2021 1929   BILIRUBINUR NEGATIVE 10/23/2021 1929   BILIRUBINUR neg 08/01/2019 0930   KETONESUR NEGATIVE 10/23/2021 1929   PROTEINUR 100 (A) 10/23/2021 1929   UROBILINOGEN 0.2 11/17/2020 1442   NITRITE NEGATIVE 10/23/2021 1929   LEUKOCYTESUR NEGATIVE 10/23/2021 1929   Discussed in detail with patient's sister via phone, updated care and answered all questions.   Time coordinating discharge: 40 minutes  SIGNED:  Vernell Leep, MD,  FACP, [place], [facility], [facility], [facility]   [facility] Advisor [place]     To contact the attending provider between 7A-7P or the covering provider during after hours 7P-7A, please log into the web site [email or web address] and access using universal [place] password for that web site. If you do not have the password, please call the hospital operator.

## 2021-10-28 NOTE — Discharge Instructions (Signed)

## 2021-10-28 NOTE — TOC Transition Note (Signed)
Transition of Care [facility]) - CM/SW Discharge Note   Patient Details  Name: Sarah [name] MRN: [id number] Date of Birth: [birth year]  [facility] CM/SW Contact:  Pollie Friar, RN Phone Number: 10/28/2021, 12:52 PM   Clinical Narrative:    Pt is discharging home with outpatient therapy at [facility]. Orders in Epic and information on the AVS.  Rollator for home to be delivered to the room per [employer]. Sister at the bedside and states the family will be providing 24 hour supervision and will assist with her meds, finances, etc at home.  Sister to provide transport home today.    Final next level of care: OP Rehab Barriers to Discharge: No Barriers Identified   Patient Goals and CMS Choice   CMS Medicare.gov Compare Post Acute Care list provided to:: Patient Represenative (must comment) Choice offered to / list presented to : Sibling  Discharge Placement                       Discharge Plan and Services   Discharge Planning Services: CM Consult Post Acute Care Choice: IP Rehab          DME Arranged: Walker rolling with seat DME Agency: [employer] Date DME Agency Contacted: 10/28/21   Representative spoke with at DME Agency: Annie Sable            Social Determinants of Health ([place]) Interventions Transportation Interventions: Inpatient TOC, [id number] Referral   Readmission Risk Interventions     No data to display

## 2021-10-28 NOTE — Inpatient Diabetes Management (Signed)
Inpatient Diabetes Program Recommendations  AACE/ADA: New Consensus Statement on Inpatient Glycemic Control (2015)  Target Ranges:  Prepandial:   less than 140 mg/dL      Peak postprandial:   less than 180 mg/dL (1-2 hours)      Critically ill patients:  140 - 180 mg/dL   Lab Results  Component Value Date   GLUCAP 178 (H) 10/28/2021   HGBA1C 12.6 (H) 10/25/2021   Spoke with patient and sister from [place] at bedside.  Sarah [name] has Lantus, Humalog and a Dexcom at home.  She has some trouble recalling exactly how much she takes.  When asked if she ever forgets to take her insulins she states, "maybe sometimes".  Given the fact that she her blood glucose has been running low while inpatient with insulin doses needing to be decreased and her A1C is 12.6% (average BS of 306 mg/dL), it is likely she has not been administering insulin at home.  She has a Dexcom at home.  Encouraged them to set her low alarms to 100 mg/dL to avoid lows.  If her BS runs consistently <100 or > 200 mg/dL they should call her endocrinologist, Dr. Kelton Pillar.    Discussed diet, CHO's and potion control with sister.  Sister will be staying with her for 2 weeks.  She also has a sister who lives around the corner and a brother as well.    Spoke with Dr. Algis Liming.  He is sending her home on lantus or toujeo 20 units QHS & humalog 3 units with meals.  Discussed this with family.    Will continue to follow while inpatient.  Thank you, Reche Dixon, MSN, [place] Diabetes Coordinator Inpatient Diabetes Program [phone] (team pager from 8a-5p)

## 2021-10-28 NOTE — Progress Notes (Signed)
IP rehab admissions - Patient had therapy this morning and is doing very well.  Feel that patient is now doing too well and no longer needs an inpatient rehab program.  Her rehab needs can be met with discharge home and outpatient therapy or HH therapies.  Call me for questions.  [phone]

## 2021-10-28 NOTE — Inpatient Diabetes Management (Signed)
Inpatient Diabetes Program Recommendations  AACE/ADA: New Consensus Statement on Inpatient Glycemic Control (2015)  Target Ranges:  Prepandial:   less than 140 mg/dL      Peak postprandial:   less than 180 mg/dL (1-2 hours)      Critically ill patients:  140 - 180 mg/dL   Lab Results  Component Value Date   GLUCAP 83 10/28/2021   HGBA1C 12.6 (H) 10/25/2021    Review of Glycemic Control  Latest Reference Range & Units 10/28/21 06:09 10/28/21 06:58 10/28/21 08:10  Glucose-Capillary 70 - 99 mg/dL 53 (L) 126 (H) 83  (L): Data is abnormally low (H): Data is abnormally high   Home DM Meds:  Farxiga 10 mg daily Toujeo 80 units QPM Humalog 14 units TID with meals   Current Orders:  Semglee 40 units daily Novolog 0-6 units TID and 0-5 units QHS  Inpatient Diabetes Program Recommendations:    Hypoglycemia again this morning.  Please further decrease basal insulin:  Semglee 20 units QD  Will continue to follow while inpatient.  Thank you, Reche Dixon, MSN, [place] Diabetes Coordinator Inpatient Diabetes Program [phone] (team pager from 8a-5p)

## 2021-10-29 ENCOUNTER — Telehealth: Payer: Self-pay

## 2021-10-29 ENCOUNTER — Telehealth: Payer: Self-pay | Admitting: *Deleted

## 2021-10-29 ENCOUNTER — Other Ambulatory Visit: Payer: Self-pay

## 2021-10-29 DIAGNOSIS — I119 Hypertensive heart disease without heart failure: Secondary | ICD-10-CM

## 2021-10-29 DIAGNOSIS — Z794 Long term (current) use of insulin: Secondary | ICD-10-CM

## 2021-10-29 DIAGNOSIS — E1121 Type 2 diabetes mellitus with diabetic nephropathy: Secondary | ICD-10-CM

## 2021-10-29 NOTE — Telephone Encounter (Signed)
Transition Care Management Follow-up Telephone Call Date of discharge and from where: [facility] 10/28/2021 How have you been since you were released from the hospital? okay Any questions or concerns? No  Items Reviewed: Did the pt receive and understand the discharge instructions provided? Yes  Medications obtained and verified? Yes  Other? No  Any new allergies since your discharge? No  Dietary orders reviewed? Yes Do you have support at home? Yes   Home Care and Equipment/Supplies: Were home health services ordered? no If so, what is the name of the agency? N/a  Has the agency set up a time to come to the patient's home? no Were any new equipment or medical supplies ordered?  No What is the name of the medical supply agency? N/a Were you able to get the supplies/equipment? no Do you have any questions related to the use of the equipment or supplies? No  Functional Questionnaire: (I = Independent and D = Dependent) ADLs: I  Bathing/Dressing- I  Meal Prep- I  Eating- I  Maintaining continence- I  Transferring/Ambulation- I  Managing Meds- I  Follow up appointments reviewed:  PCP Hospital f/u appt confirmed? Yes  Scheduled to see Dr [name] on 11/02/2021 @ 3:30. [facility] f/u appt confirmed? No   Are transportation arrangements needed? No  If their condition worsens, is the pt aware to call PCP or go to the Emergency Dept.? Yes Was the patient provided with contact information for the PCP's office or ED? Yes Was to pt encouraged to call back with questions or concerns? Yes Juanda Crumble, LPN [place] Direct Dial [phone]

## 2021-10-29 NOTE — Patient Outreach (Signed)
TYSHAE STAIR Dec 04, 1945 [id number]   [place] [facility] [ACO] Patient: Theme park manager  Patient was referred for [place] [facility] for [employer]. Patient was previously active with [facility].    Of note, [facility] Care Management services does not replace or interfere with any services that are arranged by inpatient [facility] care management team.   For additional questions or referrals please contact:   Natividad Brood, RN BSN [place]  [phone] business mobile phone Toll free office [phone]  *[place]  [phone] Fax number: [phone] [email or web address]

## 2021-10-29 NOTE — Chronic Care Management (AMB) (Signed)
  Care Coordination   Note   10/29/2021 Name: Sarah [name] MRN: [id number] DOB: 1945-11-12  Sarah [name] is a 76 y.o. year old female who sees [name], Malka So, MD for primary care. I reached out to [name] by phone today to offer care coordination services.  [name] was given information about Care Coordination services today including:   The Care Coordination services include support from the care team which includes your Nurse Coordinator, Clinical Social Worker, or Pharmacist.  The Care Coordination team is here to help remove barriers to the health concerns and goals most important to you. Care Coordination services are voluntary, and the patient may decline or stop services at any time by request to their care team member.   Care Coordination Consent Status: Patient agreed to services and verbal consent obtained.   Follow up plan:  Telephone appointment with care coordination team member scheduled for:  10/30/21  Encounter Outcome:  Pt. Scheduled  [place]  Direct Dial: [phone]

## 2021-10-30 ENCOUNTER — Telehealth: Payer: Self-pay | Admitting: Internal Medicine

## 2021-10-30 ENCOUNTER — Ambulatory Visit: Payer: Self-pay | Admitting: *Deleted

## 2021-10-30 NOTE — Progress Notes (Unsigned)
HPI: [name] is a 76 y.o. female with medical history significant for poorly controlled DM 2, hypertension, CVA, proteinuria, CKD 3, and CAD status post DES to RCA in 03/2020 here today with her sister to follow on recent hospital visit. She presented to the ED with unstable gait and memory lapses. Diagnosed with acute CVA: Punctuate acute infarcts of the left cerebellum and left parietal lobe.  No hemorrhage or mass effect.  Old posterior right MCA territory infarct" with old right cerebral infarct. Hospitalized from 10-23-2021 to 10-28-2021.  Transition of care call on 10/29/2021.  During hospitalization neurology and cardiology were consulted.  Carotid ultrasound: 1 to 39% stenosis bilateral. Loop recorder placed in the past without evidence of atrial fibrillation. Echo on 10/24/2021 show LVEF 60 to 65% and grade 1 diastolic dysfunction. She follows with cardiology regularly, last visit on 10/21/2021.Marland Kitchen She is currently on Plavix 75 mg daily, Aspirin 81 mg was added and instructed to take for 3 weeks. Her sister, who lives in [place], will be here for a few days and she has made arrangements with other siblings to provide 24/7 care for her and help with their mother care. She is scheduled to start outpatient rehab on 11/05/2021 and has an appointment with neurologist on 11/04/2021.Marland Kitchen  She is using a walker for transfer, rest of ADLs independent. She has been instructed to avoid driving.  She  reports experiencing fatigue. Appetite is good, her sister wonders if recent CVA has contributed to increased appetite. She has not been consistent with following a healthful diet.  DM II" Since hospital discharge she has been taking Humalog, Farxiga 10 mg daily, Lantus 20 U daily, and Toujeo 20 U daily.  She has not been taking Victoza. BS between 90 and 110. Lab Results  Component Value Date   HGBA1C 12.6 (H) 10/25/2021  She has an appointment tomorrow with her  endocrinologist.  Hyperlipidemia: She is on atorvastatin 80 mg but having difficulty swallowing tablet.  She has not been on Zetia 10 mg, she is not sure when she stopped taking medication. Lab Results  Component Value Date   CHOL 362 (H) 10/24/2021   HDL 93 10/24/2021   LDLCALC 227 (H) 10/24/2021   TRIG 210 (H) 10/24/2021   CHOLHDL 3.9 10/24/2021   The patient has been taking magnesium 400 mg, which has helped with leg cramps but complaining about size of capsule. Magnesium was 2.0 on 10/26/2021.  Hypertension on hydralazine 10 mg 3 times daily losartan was decreased from 100 mg to 50 mg, and taking amlodipine 5 mg daily. She is not checking BP regularly. Negative for CP, worsening dyspnea, or palpitations. Lower extremity edema has improved since hospital discharge. She has an appointment with nephrologist in 11/2021.  Lab Results  Component Value Date   WBC 8.0 10/25/2021   HGB 11.2 (L) 10/25/2021   HCT 34.1 (L) 10/25/2021   MCV 93.2 10/25/2021   PLT 352 10/25/2021   She is currently on gabapentin 100 mg at bedtime and tramadol 50 mg daily to treat lower back pain radiated to right lower extremity.  She is reporting problem as stable. Has tolerated medications well.  Review of Systems  Constitutional:  Positive for activity change and fatigue. Negative for appetite change and fever.  HENT:  Negative for mouth sores, nosebleeds and sore throat.   Eyes:  Negative for pain and redness.  Respiratory:  Negative for cough, shortness of breath and wheezing.   Cardiovascular:  Negative for chest pain  and palpitations.  Gastrointestinal:  Negative for abdominal pain, nausea and vomiting.       Negative for changes in bowel habits.  Genitourinary:  Negative for decreased urine volume, dysuria and hematuria.  Musculoskeletal:  Positive for arthralgias, back pain and gait problem.  Skin:  Negative for rash.  Neurological:  Negative for syncope and headaches.  Psychiatric/Behavioral:   Negative for confusion. The patient is nervous/anxious.   Rest see pertinent positives and negatives per HPI.  Current Outpatient Medications on File Prior to Visit  Medication Sig Dispense Refill   amLODipine (NORVASC) 5 MG tablet Take 1 tablet (5 mg total) by mouth daily. 90 tablet 3   aspirin EC 81 MG tablet Take 1 tablet (81 mg total) by mouth daily for 16 days. Swallow whole. 16 tablet 0   atorvastatin (LIPITOR) 80 MG tablet TAKE 1 TABLET(80 MG) BY MOUTH DAILY AT 6 PM (Patient taking differently: Take 80 mg by mouth at bedtime.) 90 tablet 2   clopidogrel (PLAVIX) 75 MG tablet TAKE 1 TABLET(75 MG) BY MOUTH DAILY (Patient taking differently: Take 75 mg by mouth daily.) 90 tablet 1   dapagliflozin propanediol (FARXIGA) 10 MG TABS tablet Take 1 tablet (10 mg total) by mouth daily. 90 tablet 3   furosemide (LASIX) 20 MG tablet TAKE 1 TABLET(20 MG) BY MOUTH DAILY (Patient taking differently: 40 mg.) 30 tablet 2   gabapentin (NEURONTIN) 100 MG capsule Take 1-2 capsules (100-200 mg total) by mouth at bedtime. 60 capsule 0   hydrALAZINE (APRESOLINE) 10 MG tablet Take 1 tablet (10 mg total) by mouth 3 (three) times daily. 240 tablet 3   insulin glargine, 1 Unit Dial, (TOUJEO SOLOSTAR) 300 UNIT/ML Solostar Pen Inject 20 Units into the skin daily in the afternoon.     insulin lispro (HUMALOG KWIKPEN) 200 UNIT/ML KwikPen Inject 3 Units into the skin 3 (three) times daily with meals.     latanoprost (XALATAN) 0.005 % ophthalmic solution Place 1 drop into both eyes at bedtime.     losartan (COZAAR) 50 MG tablet TAKE 1 TABLET(50 MG) BY MOUTH DAILY (Patient taking differently: Take 100 mg by mouth daily.) 90 tablet 1   Magnesium 400 MG TABS Take 400 mg by mouth daily.     Na Sulfate-K Sulfate-Mg Sulf (SUPREP BOWEL PREP KIT) 17.5-3.13-1.6 GM/177ML SOLN Take 1 kit by mouth as directed. 324 mL 0   nitroGLYCERIN (NITROSTAT) 0.4 MG SL tablet Place 1 tablet (0.4 mg total) under the tongue every 5 (five) minutes as  needed for chest pain. one tab every 5 minutes up to 3 tablets total over 15 minutes. 25 tablet 3   ondansetron (ZOFRAN) 4 MG tablet Take 1 tablet (4 mg total) by mouth every 8 (eight) hours as needed for nausea or vomiting. 20 tablet 0   sertraline (ZOLOFT) 50 MG tablet Take 1 tablet (50 mg total) by mouth daily. 90 tablet 1   traMADol (ULTRAM) 50 MG tablet Take 1 tablet (50 mg total) by mouth daily as needed. (Patient taking differently: Take 50 mg by mouth daily as needed for moderate pain or severe pain.) 30 tablet 0   No current facility-administered medications on file prior to visit.   Past Medical History:  Diagnosis Date   Bilateral carpal tunnel syndrome 03/27/2019   Boils    Glaucoma    Heart murmur    HTN (hypertension)    Hx of adenomatous colonic polyps    Hyperlipidemia    Osteoporosis    Pneumonia  Stroke ([place])    Type II or unspecified type diabetes mellitus without mention of complication, uncontrolled    No Known Allergies  Social History   Socioeconomic History   Marital status: Single    Spouse name: Not on file   Number of children: Not on file   Years of education: Not on file   Highest education level: Not on file  Occupational History   Not on file  Tobacco Use   Smoking status: Former    Types: Cigarettes    Quit date: 10/15/2016    Years since quitting: 5.0   Smokeless tobacco: Never   Tobacco comments:    smokes occ.   Vaping Use   Vaping Use: Never used  Substance and Sexual Activity   Alcohol use: Yes    Alcohol/week: 0.0 standard drinks of alcohol    Comment: occ   Drug use: No   Sexual activity: Not on file  Other Topics Concern   Not on file  Social History Narrative   Not on file   Social Determinants of Health   Financial Resource Strain: Low Risk  (04/29/2021)   Overall Financial Resource Strain (CARDIA)    Difficulty of Paying Living Expenses: Not hard at all  Food Insecurity: No Food Insecurity (10/24/2021)   Hunger  Vital Sign    Worried About Running Out of Food in the Last Year: Never true    Ran Out of Food in the Last Year: Never true  Transportation Needs: Unmet Transportation Needs (10/24/2021)   PRAPARE - Hydrologist (Medical): Yes    Lack of Transportation (Non-Medical): No  Physical Activity: Insufficiently Active (04/29/2021)   Exercise Vital Sign    Days of Exercise per Week: 1 day    Minutes of Exercise per Session: 30 min  Stress: No Stress Concern Present (04/29/2021)   [place]    Feeling of Stress : Not at all  Social Connections: Socially Isolated (04/29/2021)   Social Connection and Isolation Panel [NHANES]    Frequency of Communication with Friends and Family: More than three times a week    Frequency of Social Gatherings with Friends and Family: More than three times a week    Attends Religious Services: Never    Marine scientist or Organizations: No    Attends Archivist Meetings: Never    Marital Status: Never married    Vitals:   11/02/21 1519  BP: 128/70  Pulse: 83  Resp: 16  Temp: 99.2 F (37.3 C)  SpO2: 97%  Body mass index is 23.9 kg/m.  Physical Exam Vitals and nursing note reviewed.  Constitutional:      General: She is not in acute distress.    Appearance: She is well-developed.  HENT:     Head: Normocephalic and atraumatic.     Mouth/Throat:     Mouth: Mucous membranes are moist.     Dentition: Abnormal dentition. Dental caries present.     Pharynx: Oropharynx is clear.  Eyes:     Conjunctiva/sclera: Conjunctivae normal.  Cardiovascular:     Rate and Rhythm: Normal rate and regular rhythm.     Heart sounds: No murmur heard.    Comments: DP pulses palpable. Trace pitting LE edema, bilateral.  Pulmonary:     Effort: Pulmonary effort is normal. No respiratory distress.     Breath sounds: Normal breath sounds.  Abdominal:      Palpations:  Abdomen is soft.     Tenderness: There is no abdominal tenderness.  Skin:    General: Skin is warm.     Findings: No erythema or rash.  Neurological:     Mental Status: She is alert and oriented to person, place, and time.     Cranial Nerves: No cranial nerve deficit.     Comments: Gait assisted with a walker walker.   Psychiatric:        Mood and Affect: Mood and affect normal.   ASSESSMENT AND PLAN:  Ms. Tuwanna was seen today for hospitalization follow-up.  Diagnoses and all orders for this visit: Orders Placed This Encounter  Procedures   Lipid panel   CBC   Basic metabolic panel   Cerebrovascular accident (CVA), unspecified mechanism ([place]) Continue Aspirin 81 mg daily to complete 3 weeks. Continue Plavix 75 mg daily. On atorvastatin. Keep appointment with neurologist on 11/04/2021. Plan on starting outpatient rehab on 11/05/2021. Stressed the importance of adequate glucose, cholesterol, and BP control for secondary prevention. Cardiac monitor is going to be arranged by her cardiologist.  Type II diabetes mellitus with nephropathy ([place]) Problem has been poorly controlled. Instructed to stop Lantus and continue Toujeo 20 units daily. No changes in [place] or Humalog. She is not taking Victoza as recommended. She has an appointment with endocrinologist tomorrow.  Hypertension with heart disease BP today adequately controlled. Continue hydralazine 10 mg 3 times daily, losartan 50 mg daily, and amlodipine 5 mg daily. Monitor BP regularly. Low-salt diet recommended. Follow-up in 3 months, before if needed.  Low back pain Stable overall. Increase dose of gabapentin from 100 mg to 200 mg daily at bedtime. No changes in tramadol dose. Fall precaution discussed. Follow-up in 3 to 4 months.  Mixed hyperlipidemia LDL goal < 55. Last LDL 227 in 10/2021. Zetia 10 mg to be resume. Atorvastatin 80 mg daily to continue, she can ask her pharmacist if there is  a smaller tablet she can get otherwise she can continue splitting tablets. Low-fat diet recommended. Fasting lipid panel in 2 months.  CKD (chronic kidney disease), stage II She is following with nephrologist, she has an appointment in 11/2021. Continue furosemide 20 mg once daily. History of the importance of adequate BP and glucose control. Continue avoiding NSAIDs.  Return in about 3 months (around 02/09/2022).  Sallee Hogrefe G. [name], MD  [facility]. [place] office.

## 2021-10-30 NOTE — Telephone Encounter (Signed)
   Pt c/o medication issue:  1. Name of Medication: atorvastatin (LIPITOR) 80 MG tablet ezetimibe (ZETIA) 10 MG tablet 2. How are you currently taking this medication (dosage and times per day)?   3. Are you having a reaction (difficulty breathing--STAT)?   4. What is your medication issue? Kim pt nurse coordinator calling wondering if pt should be on both of these medications

## 2021-10-30 NOTE — Patient Outreach (Signed)
Care Coordination   Initial Visit Note   11/05/2021 Name: Sarah [name] MRN: [id number] DOB: 1945/08/18  Sarah [name] is a 76 y.o. year old female who sees Sarah [name], Malka So, MD for primary care. I spoke with  Sarah [name] by phone today.  What matters to the patients health and wellness today? Transition of care outreach noted in EPIC to be completed on 10/29/21   Appetite good -low sodium heart healthy carb modified Patient is always wanting to eat continuously all day Question if her stroke affected her appetite [name] noted that the patient was receiving a regular diet while hospitalized   MDs  follow up visits primary care provider (PCP) Dr Sarah [name] scheduled to be seen on 11/02/21  Endocrinologist, Shamleffer on 11/03/21 0930 Neurology, Fara Olden on 11/04/21  Home health PT, OT, SLP was ordered   Christa See, [facility] SW has been working with Sarah Sarah [name] on Transportation since September 2023. This was discussed with [name] after she inquired about an upcoming call scheduled for the patient. A [employer] application was provided on 09/28/21. An [employer] [place] application was completed on 10/05/21  Her sister, Sarah [name] voiced interest in assistance with support in the home for the patient & their 90+mother and options of future placement  Discussed [facility] RN CM, SW, NP and pharmacy services   Diabetes Cbg today= 291 now 214 after Toujeo was given Question the amount of Toujeo to give but sister resolved issue   Diabetes Question Sarah Sarah [name] is scheduled to be seen by Dr Kelton Pillar, Endocrinologist on 01/15/22 but while in the hospital her diabetes treatment had been adjusted as her blood sugars were low while inpatient (insulin doses needed to be decreased) . [name] questions which medications should be given to the patient at home for diabetes. The after summary indicates she is to continue her Humalog, Lantus and Toujeo 1 unit  Just got Dexcom set up per [name]. The inpatient  Diabetes coordinator indicated the Dexcom alarms to be set at 100 mg/dl for lows and 200 mg/dl for highs. If higher than 200 she needs to outreach to Dr [facility] MD sent her home on Lantus (glargine) 20 units QHS & Humalog 3 units with meals  Outreach to [place] endocrinology office at 1411 Attempted to speak with clinical staff for [facility] Had to leave a message for voiced concern with request to return a call to RN CM or patient for a possible earlier office post hospital visit to reconcile the diabetes medication   Encouraged them to use the Dexcom as a tool to assist with  Discussed the high sodium frozen foods and canned soups  Neurology  Reviewed the upcoming visit with J [name] for stroke, neuro follow up prior to neuro rehab This is the patient's reported third stroke   Hyperlipidemia management [name] found a Zetia bottle labeled 03/04/21 and questions if Zetia needs to be resume/ The patient is also on Lipitor. With review of Sarah [name]'s lipid labs, She was encouraged to take her Lipitor and Zetia until she is seen by her cardiology staff, Sarah [name] An attempt to outreach to address the need for reconciliation of cardiac medications unsuccessful to [place] at 1416  Discharge orders includes Resumed home dose of amlodipine, hydralazine, reduced dose furosemide and losartan at time of discharge  Continue home dose of density statin/atorvastatin and Zetia.  Recommend follow-up in the lipid clinic for further management-this can either be done through her PCPs office or cardiologist Spoke with  [name] at Dr Virgina Jock, NP office [phone] about possible hospital follow up appointment. [name] noted patient was seen on 10/21/21 prior to hospital admission on 10/23/21  After outreach RN Cm notes in medication list the patient was put on Lipitor and Zetia by her pcp (not Cardiology)   Medical Transportation/home support  She and [name] voiced understanding about  medical transportation services via united healthcare medicare Sister Sarah [name] is from [place] and will be with the patient until 11/10/21  She also has a sister who lives around the corner and a brother as well that can help with transportation- To do 1 1/2 week each They plan to use [place] healthcare as a secondary plan for transportation  Return call to sister 67    Voiced concern with the patient's increased amounts of diabetic and cardiac medications and concerns with possible future decline of cbg and cardiac values     Goals Addressed               This Visit's Progress     Patient Stated     manage/reconcilliation of medications ([facility]) (pt-stated)   Not on track     Care Coordination Interventions: Reviewed medications with patient and discussed importance of medication adherence Discussed plans with patient for ongoing care management follow up and provided patient with direct contact information for care management team Sent EPIC message to Dr Sarah [name] requesting assistance with refill prescription  for Sarah [name] and to adjust atorvastatin prescription to 40 mg 2 tabs (totaling 80 mg) to assist with noted swallowing difficulties of the 80 mg tablet      Other     Obtain Safe and Stable Transportation   On track     Care Coordination Interventions: Solution-Focused Strategies employed:  Active listening / Reflection utilized  Emotional Support Provided LCSW contacted pt's insurance provider inquiring about transportation services. LCSW was informed pt has emergency transportation only LCSW completed [employer] [place] application         SDOH assessments and interventions completed:  Yes  SDOH Interventions Today    Flowsheet Row Most Recent Value  SDOH Interventions   Food Insecurity Interventions Intervention Not Indicated  Housing Interventions Intervention Not Indicated  Transportation Interventions Other (Comment)  [patient has a social work referral to address.  Sister Sarah [name] is aware of the insurance transportation benefit]        Care Coordination Interventions Activated:  Yes  Care Coordination Interventions:  Yes, provided   Follow up plan: Follow up call scheduled for 11/30/21    Encounter Outcome:  Pt. Visit Completed   Malvika Tung L. [name], RN, BSN, [place] Coordinator Office number [phone]

## 2021-10-31 LAB — GLUCOSE, CAPILLARY
Glucose-Capillary: 137 mg/dL — ABNORMAL HIGH (ref 70–99)
Glucose-Capillary: 109 mg/dL — ABNORMAL HIGH (ref 70–99)
Glucose-Capillary: 82 mg/dL (ref 70–99)

## 2021-11-02 ENCOUNTER — Encounter: Payer: Self-pay | Admitting: Family Medicine

## 2021-11-02 ENCOUNTER — Ambulatory Visit (INDEPENDENT_AMBULATORY_CARE_PROVIDER_SITE_OTHER): Payer: Medicare Other | Admitting: Family Medicine

## 2021-11-02 ENCOUNTER — Ambulatory Visit: Payer: Self-pay | Admitting: Licensed Clinical Social Worker

## 2021-11-02 VITALS — BP 128/70 | HR 83 | Temp 99.2°F | Resp 16 | Ht 60.0 in | Wt 122.4 lb

## 2021-11-02 DIAGNOSIS — I119 Hypertensive heart disease without heart failure: Secondary | ICD-10-CM | POA: Diagnosis not present

## 2021-11-02 DIAGNOSIS — M545 Low back pain, unspecified: Secondary | ICD-10-CM

## 2021-11-02 DIAGNOSIS — N182 Chronic kidney disease, stage 2 (mild): Secondary | ICD-10-CM

## 2021-11-02 DIAGNOSIS — E782 Mixed hyperlipidemia: Secondary | ICD-10-CM

## 2021-11-02 DIAGNOSIS — I639 Cerebral infarction, unspecified: Secondary | ICD-10-CM | POA: Diagnosis not present

## 2021-11-02 DIAGNOSIS — E1121 Type 2 diabetes mellitus with diabetic nephropathy: Secondary | ICD-10-CM

## 2021-11-02 MED ORDER — EZETIMIBE 10 MG PO TABS: 10.0000 mg | ORAL_TABLET | Freq: Every day | ORAL | 3 refills | Status: DC

## 2021-11-02 NOTE — Telephone Encounter (Signed)
Yes, patient should be on both medications

## 2021-11-02 NOTE — Patient Instructions (Addendum)
A few things to remember from today's visit:   Hypertension with heart disease  Mixed hyperlipidemia  Cerebrovascular accident (CVA), unspecified mechanism ([place])  Keep appt with endocrinologist. Stop Lantus and continue Toujeo. Moniotr blood pressure at home. Keep appt with neurologist and be sure you have an appt with your kidney doctor. Cholesterol check in 2 months. Resume Zetia. Ask pharmacist for smaller pill for generic atorvastatin or split tab.  If you need refills for medications you take chronically, please call your pharmacy. Do not use My Chart to request refills or for acute issues that need immediate attention. If you send a my chart message, it may take a few days to be addressed, specially if I am not in the office.  Please be sure medication list is accurate. If a new problem present, please set up appointment sooner than planned today.

## 2021-11-02 NOTE — Assessment & Plan Note (Signed)
Problem has been poorly controlled. Instructed to stop Lantus and continue Toujeo 20 units daily. No changes in [place] or Humalog. She is not taking Victoza as recommended. She has an appointment with endocrinologist tomorrow.

## 2021-11-02 NOTE — Assessment & Plan Note (Signed)
Stable overall. Increase dose of gabapentin from 100 mg to 200 mg daily at bedtime. No changes in tramadol dose. Fall precaution discussed. Follow-up in 3 to 4 months.

## 2021-11-02 NOTE — Assessment & Plan Note (Signed)
LDL goal < 55. Last LDL 227 in 10/2021. Zetia 10 mg to be resume. Atorvastatin 80 mg daily to continue, she can ask her pharmacist if there is a smaller tablet she can get otherwise she can continue splitting tablets. Low-fat diet recommended. Fasting lipid panel in 2 months.

## 2021-11-02 NOTE — Assessment & Plan Note (Signed)
BP today adequately controlled. Continue hydralazine 10 mg 3 times daily, losartan 50 mg daily, and amlodipine 5 mg daily. Monitor BP regularly. Low-salt diet recommended. Follow-up in 3 months, before if needed.

## 2021-11-02 NOTE — Assessment & Plan Note (Signed)
She is following with nephrologist, she has an appointment in 11/2021. Continue furosemide 20 mg once daily. History of the importance of adequate BP and glucose control. Continue avoiding NSAIDs.

## 2021-11-03 ENCOUNTER — Other Ambulatory Visit: Payer: Self-pay | Admitting: Family Medicine

## 2021-11-03 ENCOUNTER — Encounter: Payer: Self-pay | Admitting: Internal Medicine

## 2021-11-03 ENCOUNTER — Ambulatory Visit: Payer: Self-pay | Admitting: *Deleted

## 2021-11-03 ENCOUNTER — Ambulatory Visit: Payer: Medicare Other | Admitting: Internal Medicine

## 2021-11-03 VITALS — BP 140/70 | HR 67 | Ht 60.0 in | Wt 122.8 lb

## 2021-11-03 DIAGNOSIS — Z794 Long term (current) use of insulin: Secondary | ICD-10-CM

## 2021-11-03 DIAGNOSIS — E1142 Type 2 diabetes mellitus with diabetic polyneuropathy: Secondary | ICD-10-CM

## 2021-11-03 DIAGNOSIS — E1159 Type 2 diabetes mellitus with other circulatory complications: Secondary | ICD-10-CM | POA: Diagnosis not present

## 2021-11-03 DIAGNOSIS — E782 Mixed hyperlipidemia: Secondary | ICD-10-CM

## 2021-11-03 DIAGNOSIS — E1165 Type 2 diabetes mellitus with hyperglycemia: Secondary | ICD-10-CM | POA: Diagnosis not present

## 2021-11-03 MED ORDER — INSULIN LISPRO (1 UNIT DIAL) 100 UNIT/ML (KWIKPEN): PEN_INJECTOR | SUBCUTANEOUS | 3 refills | Status: DC

## 2021-11-03 MED ORDER — ATORVASTATIN CALCIUM 40 MG PO TABS: 80.0000 mg | ORAL_TABLET | Freq: Every day | ORAL | 3 refills | Status: DC

## 2021-11-03 MED ORDER — INSULIN PEN NEEDLE 32G X 4 MM MISC: 1.0000 | Freq: Four times a day (QID) | 3 refills | Status: DC

## 2021-11-03 MED ORDER — DAPAGLIFLOZIN PROPANEDIOL 10 MG PO TABS: 10.0000 mg | ORAL_TABLET | Freq: Every day | ORAL | 3 refills | Status: DC

## 2021-11-03 MED ORDER — TOUJEO SOLOSTAR 300 UNIT/ML ~~LOC~~ SOPN: 16.0000 [IU] | PEN_INJECTOR | Freq: Every day | SUBCUTANEOUS | 3 refills | Status: DC

## 2021-11-03 NOTE — Patient Instructions (Signed)
Visit Information  Thank you for taking time to visit with me today. Please don't hesitate to contact me if I can be of assistance to you.   Following are the goals we discussed today:   Goals Addressed   None     Our next appointment is by telephone on 1 at 1  Please call the care guide team at [phone] if you need to cancel or reschedule your appointment.   If you are experiencing a Mental Health or [place] or need someone to talk to, please call the Suicide and Crisis Lifeline: 988 call the [place] National Suicide Prevention Lifeline: 959-646-1296 or TTY: 3175327701 TTY 810 567 5133) to talk to a trained counselor call 1-800-273-TALK (toll free, 24 hour hotline) call the [facility]: [phone] call 911   Patient verbalizes understanding of instructions and care plan provided today and agrees to view in [place]. Active MyChart status and patient understanding of how to access instructions and care plan via MyChart confirmed with patient.     The patient has been provided with contact information for the care management team and has been advised to call with any health related questions or concerns.   Khrista Braun L. [name], RN, BSN, [place] Coordinator Office number [phone]

## 2021-11-03 NOTE — Patient Outreach (Unsigned)
  Care Coordination   Follow Up Visit Note   11/05/2021 Name: Sarah [name] MRN: [id number] DOB: 03-15-1945  Sarah [name] is a 76 y.o. year old female who sees [name], Malka So, MD for primary care. I spoke with  [name] by phone today.  What matters to the patients health and wellness today?  Update follow up outreach, medicine reconciliation Saw pcp to help complete patient's medication reconciliation [name] is creating a chart for the care, diet & medication needs and filling a pill container plus adding medication reminder alerts on the patient's phone   Diet/Diabetes Want information about diet- heart healthy modified carb  Had to give pt orange juice this am for a of cbg 66 then the cbg was normal  since her admission the cbg values have been noted to go and down  Family members will  have to start cooking the meals for the patient and her mother to assist with the appropriate heart healthy modified carbohydrate diet  Discussed meals on wheels but they have not been Beneficial They are aware of mom's meals via [place] with [facility] SW 11/02/21 gso rides    Inquired if Physical Therapy can be changed to home vs on Thursday [address] in [place]   Update on cardiology outreach provided- need to be on both atorvastatin & zetia may go to two smaller tablets of 40 mg Atorvastatin to decrease the swallow difficulty of the one 80 mg tab        Goals Addressed               This Visit's Progress     Patient Stated     manage/reconcilliation of medications ([facility]) (pt-stated)   On track     Care Coordination Interventions: Reviewed medications with patient and discussed importance of medication adherence Discussed plans with patient for ongoing care management follow up and provided patient with direct contact information for care management team Sent EPIC message to Dr [name] requesting assistance with refill prescription   for victoza and to adjust atorvastatin prescription to 40 mg 2 tabs (totaling 80 mg) to assist with noted swallowing difficulties of the 80 mg tablet Diabetes education sent via After summary visit for Diabetes diet, meal planning, Heart healthy diet, carbohydrates plus         SDOH assessments and interventions completed:  No     Care Coordination Interventions Activated:  Yes  Care Coordination Interventions:  Yes, provided   Follow up plan: Follow up call scheduled for 11/30/21    Encounter Outcome:  Pt. Visit Completed   Itamar Mcgowan L. [name], RN, BSN, [place] Coordinator Office number [phone]

## 2021-11-03 NOTE — Patient Instructions (Addendum)
-   Decrease Toujeo 16 units once daily  - Continue  Farxiga 10 mg, 1 tablet daily  - Restart  Victoza 1.8 mg daily  - Increase Humalog 5 units with each meal  - Humalog correctional insulin: ADD extra units on insulin to your meal-time Humalog dose if your blood sugars are higher than 170. Use the scale below to help guide you:   Blood sugar before meal Number of units to inject  Less than 170 0 unit  171 - 210 1 units  211 - 250 2 units  251 - 290 3 units  291 - 330 4 units  331 - 370 5 units  371 - 410 6 units       HOW TO TREAT LOW BLOOD SUGARS (Blood sugar LESS THAN 70 MG/DL) Please follow the RULE OF 15 for the treatment of hypoglycemia treatment (when your (blood sugars are less than 70 mg/dL)   STEP 1: Take 15 grams of carbohydrates when your blood sugar is low, which includes:  3-4 GLUCOSE TABS  OR 3-4 OZ OF JUICE OR REGULAR SODA OR ONE TUBE OF GLUCOSE GEL    STEP 2: RECHECK blood sugar in 15 MINUTES STEP 3: If your blood sugar is still low at the 15 minute recheck --> then, go back to STEP 1 and treat AGAIN with another 15 grams of carbohydrates.

## 2021-11-03 NOTE — Progress Notes (Unsigned)
[facility]  PATIENT: Sarah [name] DOB: 1945/09/23    REASON FOR VISIT: Follow-up for stroke right MCA 10/2016 readmitted 02/08/2018 for right parieto-occipital stroke  HISTORY FROM: Patient  Chief complaint: No chief complaint on file.     HPI:   Update 11/04/2021 JM: Patient returns for recent hospitalization, she presented to ED on 10/13 with acute encephalopathy with memory lapses and feeling unsteady/wobbly with walking and found to have punctate left cerebellar and left parietal lobe infarctions likely incidental finding on MRI during encephalopathy work-up, etiology unclear possibly cardioembolic pattern.  MRA negative LVO, bilateral M1 segment stenosis.  Carotid Doppler unremarkable.  EEG normal.  EF 60 to 65%.  Recommend 30-day event monitor to evaluate for A-fib, previously had loop recorder without evidence of A-fib.  LDL 227.  A1c 12.6.  Recommended DAPT for 3 weeks then Plavix alone and as well as continuation of home dose Zetia and atorvastatin and recommended follow-up outpatient with lipid clinic for further lipid management.  Also advised outpatient PCP follow-up for better DM control.  She was evaluated by therapies, initially recommended CIR but due to improvement during hospitalization, recommendations changed to outpatient therapy and 24/7 care/supervision.   She is scheduled to start outpatient therapies tomorrow She has since been seen by PCP and endocrinologist     History provided for reference purposes only Update 09/11/2020 JM:, returns for requested visit due to c/o worsening balance over the past 2 months and gradual memory decline. Worsening knee pain after a near fall in June - seen by ortho for left knee arthritis and bursitis. Limiting ambulation due to pain.  She is able to ambulate without assistive device.  Mild decline of memory reported but able to maintain ADLs and IADLs independently.  Sedentary lifestyle without any type  of memory or cognitive exercises.  Denies any specific weakness, numbness/tingling, speech difficulty or confusion.  Currently on aspirin, plavix, zetia and atorvastatin for secondary stroke prevention and hx of CAD. Blood pressure today 143/72.  Glucose levels routinely monitored which has been stable.  No further concerns at this time.  Update 06/21/2019:, Sarah [name] is being seen for stroke follow-up unaccompanied.  She has been stable from a stroke standpoint without new or worsening stroke/TIA symptoms.  Prior complaints of worsening imbalance, left arm numbness/tingling and speech difficulty with MRI and MRA stable without acute abnormalities.  She reports ongoing improvement and continues to do HEP as recommended by therapy at home.  EMG/NCV completed for left arm numbness/tingling with mild bilateral carpal tunnel syndrome but otherwise unremarkable.  Reports left arm symptoms resolved as well as left-sided neck symptoms.  Continues on clopidogrel and atorvastatin for secondary stroke prevention.  Recently added Zetia by PCP due to recent lipid panel showing LDL 115.  Blood pressure today 146/72.  Recent A1c 12.3 with recent adjustments of insulin regimen and continuation of Metformin and Victoza by PCP.  Continues to follow routinely with PCP for HTN, HLD and DM management.  Loop recorder has not shown atrial fibrillation thus far.  No concerns at this time.  Update 02/13/2019: Sarah [name] is a 76 year old female who is being seen today for stroke follow-up.  She has been stable from a stroke standpoint with residual mild stable left homonymous hemianopsia, mild imbalance which she believes has been slightly worsening over the past couple of months along with possible worsening of prior aphasia from stroke in 2018.  She does admit to limited physical activity and does not have an established  exercise regimen along with endorsing increased stress currently caring for her mother with increased illness  and increased stress due to overall COVID-19 pandemic.  She has recently signed up for [employer] program which will be starting in March.  She also states for the past 2 weeks, she has been experiencing left arm numbness/tingling but will resolve quickly and symptoms intermittent.  She does endorse left-sided neck stiffness that radiates into her upper trapezius and increased pain when looking towards the left.  Denies any increased weakness.  Unable to determine if numbness/tingling symptoms occur in certain positions.  She has not previously experienced this sensation.  Denies any other new or worsening neurological symptoms.  She did have bilateral cataract surgery in 0/2585 without complication. Continues on Plavix and atorvastatin for secondary stroke prevention without side effects.  Blood pressure today 130/60.  Continues to follow with PCP for DM management with recent A1c 9.7.  Loop recorder is not shown atrial fibrillation thus far.  Monitoring/battery life will be completed 10/2019.  She has received her first dose of Covid vaccination and plans on receiving second dose next week.  Denies new or worsening stroke/TIA symptoms.  No further concerns at this time.  06/27/2018 VIRTUAL VISIT: Sarah [name] is being seen today for three-month follow-up visit regarding recent stroke.  Initially scheduled for office visit follow-up but due to COVID-19 safety precautions, visit transition to telemedicine via MyChart with patient's consent.  She has been stable from a stroke standpoint with residual left homonymous hemianopsia and balance difficulties.  She continues to participate in outpatient PT with ongoing improvement.  Continues on Plavix and atorvastatin for secondary stroke prevention without reported side effects. Blood pressure monitored at home.  Lab work obtained yesterday with A1c 10.3 (prior 13.5).  She continues to follow with PCP for ongoing management.  Loop recorder has not shown atrial  fibrillation thus far.  No further concerns at this time.  Denies new or worsening stroke/TIA symptoms.   Hospital follow-up 03/23/2018 CM: Sarah [name], 76 year old female returns for follow-up with readmission for stroke 1/29/ 2020.  MRI of the brain acute cortical and subcortical RPO infarcts.  She initially presented with left hemianopsia.  She is now on Plavix and aspirin for 3 months and then Plavix alone.  She has not had further stroke or TIA symptoms.  She has minimal bruising and no bleeding.  Hemoglobin A1c 13.5 on hospital admission.  She claims she is trying to get that down to a more reasonable level.  She remains on Lipitor without myalgias.  Blood pressure in the office today 146/87.  She continues to go to [facility].  She is to start rehab physical therapy and occupational therapy today.  Loop recorder so far no atrial fibrillation.  She is not driving due to her vision.  She returns for reevaluation  Hospital summary: right parieto-occipital infarcts in setting of progressive R MCA stenosis, infarcts secondary to large vessel disease source Resultant L hemianopia  CT head R parietal watershed infarct. Small vessel disease.  MRI  Acute cortical and subcortical R P-O infarcts. Old R CR and R frontal lobe infarcts. Small vessel disease.  MRA head  No LVO. Focal stenosis R M1, R M2/M3, L M1 MRA neck B VA origin atherosclerosis 30-50% 2D Echo  pending  Loop interrogation - last check 1/3 w/o AF - interrogation today without AF LDL 76 HgbA1c 13.5 Lovenox 40 mg sq daily for VTE prophylaxis clopidogrel 75 mg daily prior to admission,  now on aspirin 325 mg daily and clopidogrel 75 mg daily. Continue DAPT x 3 months then plavix alone Therapy recommendations:  OP OT and PT Disposition:  Home Patient advised not to drive      REVIEW OF SYSTEMS: Full 14 system review of systems performed and notable only for those listed, all others are neg:  Visual impairment,  imbalance  ALLERGIES: No Known Allergies  HOME MEDICATIONS:   PAST MEDICAL HISTORY: Past Medical History:  Diagnosis Date   Bilateral carpal tunnel syndrome 03/27/2019   Boils    Glaucoma    Heart murmur    HTN (hypertension)    Hx of adenomatous colonic polyps    Hyperlipidemia    Osteoporosis    Pneumonia    Stroke ([place])    Type II or unspecified type diabetes mellitus without mention of complication, uncontrolled     PAST SURGICAL HISTORY: Past Surgical History:  Procedure Laterality Date   ABDOMINAL HYSTERECTOMY     CARPAL TUNNEL RELEASE     right   COLONOSCOPY  12-10-10   per Dr. Deatra Ina, clear, repeat in 7 yrs    CORONARY STENT INTERVENTION N/A 03/11/2020   Procedure: CORONARY STENT INTERVENTION;  Surgeon: Jettie Booze, MD;  Location: [facility];  Service: Cardiovascular;  Laterality: N/A;   INCISION AND DRAINAGE PERIRECTAL ABSCESS N/A 09/26/2015   Procedure: IRRIGATION AND DEBRIDEMENT PERIRECTAL ABSCESS;  Surgeon: Mickeal Skinner, MD;  Location: [facility];  Service: General;  Laterality: N/A;   INTRAVASCULAR ULTRASOUND/IVUS N/A 03/11/2020   Procedure: Intravascular Ultrasound/IVUS;  Surgeon: Jettie Booze, MD;  Location: [facility];  Service: Cardiovascular;  Laterality: N/A;   KNEE ARTHROSCOPY     right   LEFT HEART CATH AND CORONARY ANGIOGRAPHY N/A 03/11/2020   Procedure: LEFT HEART CATH AND CORONARY ANGIOGRAPHY;  Surgeon: Jettie Booze, MD;  Location: [facility];  Service: Cardiovascular;  Laterality: N/A;   LOOP RECORDER INSERTION N/A 10/19/2016   Procedure: LOOP RECORDER INSERTION;  Surgeon: Thompson Grayer, MD;  Location: [facility];  Service: Cardiovascular;  Laterality: N/A;   ORIF ANKLE FRACTURE Right 03/24/2012   Procedure: OPEN REDUCTION INTERNAL FIXATION (ORIF) ANKLE FRACTURE;  Surgeon: Newt Minion, MD;  Location: [facility];  Service: Orthopedics;  Laterality: Right;  Open Reduction Internal Fixation Right  Bimalleolar ankle fracture   POLYPECTOMY     TEE WITHOUT CARDIOVERSION N/A 10/18/2016   Procedure: TRANSESOPHAGEAL ECHOCARDIOGRAM (TEE);  Surgeon: Fay Records, MD;  Location: [facility];  Service: Cardiovascular;  Laterality: N/A;   TONSILLECTOMY      FAMILY HISTORY: Family History  Problem Relation Age of Onset   Diabetes Mother    Hypertension Mother    Stroke Father    Stroke Maternal Uncle    Stroke Paternal Uncle    Colon cancer Neg Hx    Esophageal cancer Neg Hx    Rectal cancer Neg Hx    Stomach cancer Neg Hx    Inflammatory bowel disease Neg Hx    Liver disease Neg Hx    Pancreatic cancer Neg Hx     SOCIAL HISTORY: Social History   Socioeconomic History   Marital status: Single    Spouse name: Not on file   Number of children: Not on file   Years of education: Not on file   Highest education level: Not on file  Occupational History   Not on file  Tobacco Use   Smoking status: Former  Types: Cigarettes    Quit date: 10/15/2016    Years since quitting: 5.0   Smokeless tobacco: Never   Tobacco comments:    smokes occ.   Vaping Use   Vaping Use: Never used  Substance and Sexual Activity   Alcohol use: Yes    Alcohol/week: 0.0 standard drinks of alcohol    Comment: occ   Drug use: No   Sexual activity: Not on file  Other Topics Concern   Not on file  Social History Narrative   Not on file   Social Determinants of Health   Financial Resource Strain: Low Risk  (04/29/2021)   Overall Financial Resource Strain (CARDIA)    Difficulty of Paying Living Expenses: Not hard at all  Food Insecurity: No Food Insecurity (10/24/2021)   Hunger Vital Sign    Worried About Running Out of Food in the Last Year: Never true    Ran Out of Food in the Last Year: Never true  Transportation Needs: Unmet Transportation Needs (10/24/2021)   PRAPARE - Hydrologist (Medical): Yes    Lack of Transportation (Non-Medical): No  Physical Activity:  Insufficiently Active (04/29/2021)   Exercise Vital Sign    Days of Exercise per Week: 1 day    Minutes of Exercise per Session: 30 min  Stress: No Stress Concern Present (04/29/2021)   [place]    Feeling of Stress : Not at all  Social Connections: Socially Isolated (04/29/2021)   Social Connection and Isolation Panel [NHANES]    Frequency of Communication with Friends and Family: More than three times a week    Frequency of Social Gatherings with Friends and Family: More than three times a week    Attends Religious Services: Never    Marine scientist or Organizations: No    Attends Archivist Meetings: Never    Marital Status: Never married  Intimate Partner Violence: Not At Risk (10/24/2021)   Humiliation, Afraid, Rape, and Kick questionnaire    Fear of Current or Ex-Partner: No    Emotionally Abused: No    Physically Abused: No    Sexually Abused: No     PHYSICAL EXAM  There were no vitals filed for this visit.  There is no height or weight on file to calculate BMI.  General: well developed, well nourished, pleasant elderly African-American female, seated, in no evident distress Head: head normocephalic and atraumatic.   Neck: supple with no carotid or supraclavicular bruits Cardiovascular: regular rate and rhythm, no murmurs Musculoskeletal: no deformity Skin:  no rash/petichiae Vascular:  Normal pulses all extremities   Neurologic Exam Mental Status: Awake and fully alert. Occasional stuttering of speech/speech hesitancy (chronic). Oriented to place and time. Recent memory subjectively impaired and remote memory intact. Attention span, concentration and fund of knowledge appropriate. Mood and affect appropriate.  Cranial Nerves: Pupils equal, briskly reactive to light. Extraocular movements full without nystagmus. Visual fields full to confrontation. Hearing intact. Facial sensation intact.  Face, tongue, palate moves normally and symmetrically.  Motor: Normal bulk and tone. Normal strength in all tested extremity muscles. Sensory.: intact to touch , pinprick , position and vibratory sensation.  Coordination: Rapid alternating movements normal in all extremities except slightly decreased left hand dexterity (chronic from prior stroke). Finger-to-nose and heel-to-shin performed accurately bilaterally. Gait and Station: Arises from chair without difficulty. Stance is normal. Gait demonstrates normal stride length and balance without use of assistive device  although mild favoring of left knee.  Difficulty performing tandem walk and heel toe.  Reflexes: 1+ and symmetric. Toes downgoing.      DIAGNOSTIC DATA (LABS, IMAGING, TESTING) - I reviewed patient records, labs, notes, testing and imaging myself where available.  Lab Results  Component Value Date   WBC 8.0 10/25/2021   HGB 11.2 (L) 10/25/2021   HCT 34.1 (L) 10/25/2021   MCV 93.2 10/25/2021   PLT 352 10/25/2021      Component Value Date/Time   NA 143 10/28/2021 0404   NA 142 09/16/2021 0000   K 3.8 10/28/2021 0404   CL 106 10/28/2021 0404   CO2 28 10/28/2021 0404   GLUCOSE 55 (L) 10/28/2021 0404   BUN 22 10/28/2021 0404   BUN 10 09/16/2021 0000   CREATININE 0.91 10/28/2021 0404   CREATININE 0.55 (L) 10/24/2019 1602   CALCIUM 9.0 10/28/2021 0404   PROT 6.2 09/22/2020 0958   ALBUMIN 1.6 (L) 10/26/2021 0445   ALBUMIN 3.5 (L) 09/22/2020 0958   AST 20 09/22/2020 0958   ALT 17 09/22/2020 0958   ALKPHOS 99 09/22/2020 0958   BILITOT <0.2 09/22/2020 0958   GFRNONAA >60 10/28/2021 0404   GFRNONAA 94 10/24/2019 1602   GFRAA 103 03/04/2020 1025   GFRAA 109 10/24/2019 1602   Lab Results  Component Value Date   CHOL 362 (H) 10/24/2021   HDL 93 10/24/2021   LDLCALC 227 (H) 10/24/2021   TRIG 210 (H) 10/24/2021   CHOLHDL 3.9 10/24/2021   Lab Results  Component Value Date   HGBA1C 12.6 (H) 10/25/2021         ASSESSMENT AND PLAN    Sarah [name] is a 76 y.o. female with history of recent punctate left cerebellar and left parietal lobe infarcts in 10/2021 likely incidental finding on MRI during encephalopathy work-up of unclear etiology, right parieto-occipital infarcts in setting of progressive right MCA stenosis in 2020 likely secondary to large vessel disease source and hx of right MCA stroke in 2018 s/p ILR without evidence of A-fib during 4 year monitoring duration    -continue plavix and lipitor and Zetia for stroke prevention -Continue close PCP, endocrinology and cardiology follow-up for aggressive stroke risk factor management maintain blood pressure goal <130/80, diabetes with hemoglobin A1c goal below 7.0% and lipids with LDL cholesterol goal below 70 mg/dL.  -Stroke labs 10/2021: A1c 12.6, LDL 227    Stable from stroke standpoint recommend follow-up as needed   I spent 22 minutes of face-to-face and non-face-to-face time with patient.  This included previsit chart review, lab review, study review, order entry, electronic health record documentation, patient education and discussion as documented above  CC:  [place] provider: Dr. Sethi [name], Malka So, MD    Frann Rider, [facility]  [facility] [address] [place], [place] [address]  Phone [phone] Fax [phone] Note: This document was prepared with digital dictation and possible smart phrase technology. Any transcriptional errors that result from this process are unintentional.

## 2021-11-03 NOTE — Telephone Encounter (Signed)
Spoke with Maudie Mercury and informed her that patient is to be taking both atorvastatin and Zetia.  Kim verbalized understanding and expressed appreciation for follow-up.

## 2021-11-03 NOTE — Progress Notes (Signed)
Name: Sarah [name]  Age/ Sex: 76 y.o., female   MRN/ DOB: [id number], [birth year]     PCP: [name], [name], MD   Reason for Endocrinology Evaluation: Type 2 Diabetes Mellitus  Initial Endocrine Consultative Visit: 08/15/2019    PATIENT IDENTIFIER: Sarah [name] is a 76 y.o. female with a past medical history of T2DM, CVA, Dyslipidemia  And CAD (S/P PCI 03/2020). The patient has followed with Endocrinology clinic since 08/15/2019 for consultative assistance with management of her diabetes.  DIABETIC HISTORY:  Sarah [name] was diagnosed with DM many years ago. Her hemoglobin A1c has ranged from 10.3% in 2020, peaking at 16.3% in 2016.   On her initial visit to our clinic she had an A1c of 12.5 % She was on MDI regimen but was not taking Metformin due to GI side effects so we stopped it. We adjusted MDI regimen and continued Victoza    Started Farxiga 02/2020    Lives with mother who is 8 yrs ago  SUBJECTIVE:   During the last visit (05/01/2021): A1c 15.0 % increased Lantus and Humalog, continued Victoza and [place]     Today (11/03/2021): Sarah [name] is here for a follow up on diabetes management.  She was recently hospitalized for acute CVA after presenting there for altered mental status. She is accompanied by her sister ( [name])from [place] today .  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 Continues to follow up with [facility]  She has headaches  Denies nausea or abdominal pain   She lives with mother    HOME DIABETES REGIMEN:  Victoza 1.8 mg daily  Toujeo 20  units daily Humalog 3 units with each meal  Farxiga 10 mg daily  Correction factor: Humalog (BG -130/30)     Statin: yes ACE-I/ARB: yes     CONTINUOUS GLUCOSE MONITORING RECORD INTERPRETATION    Dates of Recording:10/11-10/24/2023  Sensor description: Dexcom  Results statistics:   CGM use % of time 21  Average and SD 131/55  Time in range 74%  % Time Above  180 16  % Time above 250 3  % Time Below target 0     Glycemic patterns summary: hypoglycemia at night and hyperglycemia during the day   Hyperglycemic episodes  All day   Hypoglycemic episodes occurred at night  Overnight periods: low      DIABETIC COMPLICATIONS: Microvascular complications:  Retinopathy, neuropathy  Denies: CKD Last Eye Exam: Completed 12/24/2020  Macrovascular complications:  CVA, CAD (S/P PCI 03/2020 Denies: PVD   HISTORY:  Past Medical History:  Past Medical History:  Diagnosis Date   Bilateral carpal tunnel syndrome 03/27/2019   Boils    Glaucoma    Heart murmur    HTN (hypertension)    Hx of adenomatous colonic polyps    Hyperlipidemia    Osteoporosis    Pneumonia    Stroke ([place])    Type II or unspecified type diabetes mellitus without mention of complication, uncontrolled    Past Surgical History:  Past Surgical History:  Procedure Laterality Date   ABDOMINAL HYSTERECTOMY     CARPAL TUNNEL RELEASE     right   COLONOSCOPY  12-10-10   per Dr. Deatra Ina, clear, repeat in 7 yrs    CORONARY STENT INTERVENTION N/A 03/11/2020   Procedure: CORONARY STENT INTERVENTION;  Surgeon: Jettie Booze, MD;  Location: [facility];  Service: Cardiovascular;  Laterality: N/A;   INCISION AND DRAINAGE PERIRECTAL ABSCESS N/A 09/26/2015  Procedure: IRRIGATION AND DEBRIDEMENT PERIRECTAL ABSCESS;  Surgeon: Mickeal Skinner, MD;  Location: [facility];  Service: General;  Laterality: N/A;   INTRAVASCULAR ULTRASOUND/IVUS N/A 03/11/2020   Procedure: Intravascular Ultrasound/IVUS;  Surgeon: Jettie Booze, MD;  Location: [facility];  Service: Cardiovascular;  Laterality: N/A;   KNEE ARTHROSCOPY     right   LEFT HEART CATH AND CORONARY ANGIOGRAPHY N/A 03/11/2020   Procedure: LEFT HEART CATH AND CORONARY ANGIOGRAPHY;  Surgeon: Jettie Booze, MD;  Location: [facility];  Service: Cardiovascular;  Laterality: N/A;   LOOP RECORDER  INSERTION N/A 10/19/2016   Procedure: LOOP RECORDER INSERTION;  Surgeon: Thompson Grayer, MD;  Location: [facility];  Service: Cardiovascular;  Laterality: N/A;   ORIF ANKLE FRACTURE Right 03/24/2012   Procedure: OPEN REDUCTION INTERNAL FIXATION (ORIF) ANKLE FRACTURE;  Surgeon: Newt Minion, MD;  Location: [facility];  Service: Orthopedics;  Laterality: Right;  Open Reduction Internal Fixation Right Bimalleolar ankle fracture   POLYPECTOMY     TEE WITHOUT CARDIOVERSION N/A 10/18/2016   Procedure: TRANSESOPHAGEAL ECHOCARDIOGRAM (TEE);  Surgeon: Fay Records, MD;  Location: [facility];  Service: Cardiovascular;  Laterality: N/A;   TONSILLECTOMY     Social History:  reports that she quit smoking about 5 years ago. Her smoking use included cigarettes. She has never used smokeless tobacco. She reports current alcohol use. She reports that she does not use drugs. Family History:  Family History  Problem Relation Age of Onset   Diabetes Mother    Hypertension Mother    Stroke Father    Stroke Maternal Uncle    Stroke Paternal Uncle    Colon cancer Neg Hx    Esophageal cancer Neg Hx    Rectal cancer Neg Hx    Stomach cancer Neg Hx    Inflammatory bowel disease Neg Hx    Liver disease Neg Hx    Pancreatic cancer Neg Hx      HOME MEDICATIONS: Allergies as of 11/03/2021   No Known Allergies      Medication List        Accurate as of November 03, 2021 11:43 AM. If you have any questions, ask your nurse or doctor.          amLODipine 5 MG tablet Commonly known as: NORVASC Take 1 tablet (5 mg total) by mouth daily.   aspirin EC 81 MG tablet Take 1 tablet (81 mg total) by mouth daily for 16 days. Swallow whole.   atorvastatin 80 MG tablet Commonly known as: LIPITOR TAKE 1 TABLET(80 MG) BY MOUTH DAILY AT 6 PM What changed: See the new instructions.   clopidogrel 75 MG tablet Commonly known as: PLAVIX TAKE 1 TABLET(75 MG) BY MOUTH DAILY What changed: See the new  instructions.   dapagliflozin propanediol 10 MG Tabs tablet Commonly known as: Farxiga Take 1 tablet (10 mg total) by mouth daily.   ezetimibe 10 MG tablet Commonly known as: ZETIA Take 1 tablet (10 mg total) by mouth daily.   furosemide 20 MG tablet Commonly known as: LASIX TAKE 1 TABLET(20 MG) BY MOUTH DAILY What changed: See the new instructions.   gabapentin 100 MG capsule Commonly known as: NEURONTIN Take 1-2 capsules (100-200 mg total) by mouth at bedtime.   HumaLOG KwikPen 200 UNIT/ML KwikPen Generic drug: insulin lispro Inject 3 Units into the skin 3 (three) times daily with meals.   hydrALAZINE 10 MG tablet Commonly known as: APRESOLINE Take 1 tablet (10 mg  total) by mouth 3 (three) times daily.   latanoprost 0.005 % ophthalmic solution Commonly known as: XALATAN Place 1 drop into both eyes at bedtime.   losartan 50 MG tablet Commonly known as: COZAAR TAKE 1 TABLET(50 MG) BY MOUTH DAILY What changed: See the new instructions.   Magnesium 400 MG Tabs Take 400 mg by mouth daily.   Na Sulfate-K Sulfate-Mg Sulf 17.5-3.13-1.6 GM/177ML Soln Commonly known as: Suprep Bowel Prep Kit Take 1 kit by mouth as directed.   nitroGLYCERIN 0.4 MG SL tablet Commonly known as: NITROSTAT Place 1 tablet (0.4 mg total) under the tongue every 5 (five) minutes as needed for chest pain. one tab every 5 minutes up to 3 tablets total over 15 minutes.   ondansetron 4 MG tablet Commonly known as: Zofran Take 1 tablet (4 mg total) by mouth every 8 (eight) hours as needed for nausea or vomiting.   sertraline 50 MG tablet Commonly known as: ZOLOFT Take 1 tablet (50 mg total) by mouth daily.   Toujeo SoloStar 300 UNIT/ML Solostar Pen Generic drug: insulin glargine (1 Unit Dial) Inject 20 Units into the skin daily in the afternoon.   traMADol 50 MG tablet Commonly known as: ULTRAM Take 1 tablet (50 mg total) by mouth daily as needed. What changed: reasons to take this          OBJECTIVE:   Vital Signs: BP (!) 140/70 (BP Location: Left Arm, Patient Position: Sitting, Cuff Size: Small)   Pulse 67   Ht 5' (1.524 m)   Wt 122 lb 12.8 oz (55.7 kg)   SpO2 97%   BMI 23.98 kg/m   Wt Readings from Last 3 Encounters:  11/03/21 122 lb 12.8 oz (55.7 kg)  11/02/21 122 lb 6 oz (55.5 kg)  10/26/21 119 lb 0.8 oz (54 kg)     Exam: General: Pt appears well and is in NAD  Lungs: Clear with good BS bilat with no rales, rhonchi, or wheezes  Heart: RRR   Extremities: No pretibial edema.   Neuro: MS is good with appropriate affect, pt is alert and Ox3   DM foot exam: 12/25/2020 The skin of the feet is intact without sores or ulcerations. The pedal pulses are 1+ on right and 1+ on left. The sensation is intact to a screening 5.07, 10 gram monofilament bilaterally    DATA REVIEWED:  Lab Results  Component Value Date   HGBA1C 12.6 (H) 10/25/2021   HGBA1C 11.7 (A) 08/31/2021   HGBA1C >15.0 05/01/2021   Old records , labs and images have been reviewed.   ASSESSMENT / PLAN / RECOMMENDATIONS:   1) Type 2 Diabetes Mellitus, Poorly controlled, With neuropathic and  macrovascular  complications - Most recent A1c of 12.6  %. Goal A1c < 7.0 %.     -Historically Sarah [name] has had severe hyperglycemia due to medication nonadherence and dietary indiscretion, her Dexcom download today shows that her average BG's has decreased from > 300 to 131 mg/dL with family intervention and taking her medication, interestingly enough now that she is taking her medication regularly she is requiring much less insulin than previously prescribed -In reviewing her Dexcom the patient has been noted with hypoglycemia overnight and hyperglycemia during the day, I will reduce her basal insulin and increase prandial dose of insulin -She was encouraged to use correction scale before each meal -I have also encouraged her to  restart Victoza to suppress her appetite, persisted the patient has  had constant hunger  MEDICATIONS: - Continue Farxiga  10 mg, 1 tablet daily  - Restart Victoza 1.8 mg daily  - Decrease Toujeo 16 units daily -Increase Humalog 5 units with meals  -Restart CF : Humalog (BG-130/30)    EDUCATION / INSTRUCTIONS: BG monitoring instructions: Patient is instructed to check her blood sugars 3 times a day, before meals  Call [facility] if: BG persistently < 70 I reviewed the Rule of 15 for the treatment of hypoglycemia in detail with the patient. Literature supplied.   2) Diabetic complications:  Eye: there's a questionable diabetic retinopathy from her history Neuro/ Feet: Does have known diabetic peripheral neuropathy .  Renal: Patient does not have known baseline CKD. She   is  on an ACEI/ARB at present.       F/U in 4 months    Signed electronically by: Mack Guise, MD  [facility]  [facility] [address] [place], [place] [address] Phone: [phone] FAX: [phone]   CC: [name], [name], [place] [address] Phone: [phone]  Fax: [phone]  Return to Endocrinology clinic as below: Future Appointments  Date Time Provider [place]  11/03/2021  1:00 PM Barbaraann Faster, RN [facility]N-CCC None  11/04/2021  1:15 PM Frann Rider, NP [facility]NA None  11/05/2021  9:30 AM Hans Eden, OT [facility]  11/05/2021 10:15 AM Excell Seltzer, PT [facility]  11/11/2021  1:30 PM Mansouraty, Telford Nab., MD [facility]-LEC [facility]  11/12/2021  3:30 PM Vedia Pereyra, PT [facility]-REH [facility]  12/08/2021 12:45 PM Imogene Burn, PA-C [facility]  01/01/2022 10:00 AM LB[facility] LAB [facility] P[facility]  05/03/2022  9:30 AM LBPC-NURSE HEALTH ADVISOR [facility] PEC  05/10/2022  9:30 AM Dennisha Mouser, Melanie Crazier, MD LBPC-LBENDO None

## 2021-11-04 ENCOUNTER — Telehealth: Payer: Self-pay | Admitting: *Deleted

## 2021-11-04 ENCOUNTER — Telehealth: Payer: Self-pay | Admitting: Adult Health

## 2021-11-04 ENCOUNTER — Ambulatory Visit: Payer: Medicare Other | Admitting: Adult Health

## 2021-11-04 ENCOUNTER — Encounter: Payer: Self-pay | Admitting: Adult Health

## 2021-11-04 ENCOUNTER — Ambulatory Visit: Payer: Medicare Other | Attending: Internal Medicine

## 2021-11-04 VITALS — BP 141/72 | HR 91 | Ht 60.0 in | Wt 117.5 lb

## 2021-11-04 DIAGNOSIS — R269 Unspecified abnormalities of gait and mobility: Secondary | ICD-10-CM | POA: Diagnosis not present

## 2021-11-04 DIAGNOSIS — I6340 Cerebral infarction due to embolism of unspecified cerebral artery: Secondary | ICD-10-CM

## 2021-11-04 DIAGNOSIS — Z09 Encounter for follow-up examination after completed treatment for conditions other than malignant neoplasm: Secondary | ICD-10-CM

## 2021-11-04 DIAGNOSIS — Z8673 Personal history of transient ischemic attack (TIA), and cerebral infarction without residual deficits: Secondary | ICD-10-CM | POA: Diagnosis not present

## 2021-11-04 DIAGNOSIS — I69398 Other sequelae of cerebral infarction: Secondary | ICD-10-CM | POA: Diagnosis not present

## 2021-11-04 DIAGNOSIS — I4891 Unspecified atrial fibrillation: Secondary | ICD-10-CM | POA: Diagnosis not present

## 2021-11-04 NOTE — Patient Instructions (Addendum)
Start therapies as scheduled tomorrow for likely further recovery  Please keep me updated on how your headaches are doing - if they are persistent over the next 2 to 3 weeks and you are interested in starting medication management, please let me know  I will reach out to cardiology regarding your heart monitor - someone will be in contact with you regarding this  Follow-up on Monday with your eye doctor as scheduled  You will be referred to our sleep clinic to be further evaluated for possible underlying sleep apnea which could be contributing to morning headaches and fatigue  Continue aspirin 81 mg daily and clopidogrel 75 mg daily  and atorvastatin  for secondary stroke prevention Complete your current prescription of aspirin and then discontinue, you will continue on Plavix alone after that point  Continue to follow up with PCP regarding cholesterol, blood pressure and diabetes management Maintain strict control of hypertension with blood pressure goal below 130/90, diabetes with hemoglobin A1c goal below 7.0 % and cholesterol with LDL cholesterol (bad cholesterol) goal below 70 mg/dL.   Signs of a Stroke? Follow the BEFAST method:  Balance Watch for a sudden loss of balance, trouble with coordination or vertigo Eyes Is there a sudden loss of vision in one or both eyes? Or double vision?  Face: Ask the person to smile. Does one side of the face droop or is it numb?  Arms: Ask the person to raise both arms. Does one arm drift downward? Is there weakness or numbness of a leg? Speech: Ask the person to repeat a simple phrase. Does the speech sound slurred/strange? Is the person confused ? Time: If you observe any of these signs, call 911.     Followup in the future with me in 4 months or call earlier if needed       Thank you for coming to see [place] at [facility]. I hope we have been able to provide you high quality care today.  You may receive a patient  satisfaction survey over the next few weeks. We would appreciate your feedback and comments so that we may continue to improve ourselves and the health of our patients.

## 2021-11-04 NOTE — Patient Outreach (Signed)
  Care Coordination   Follow Up Visit Note   11/05/2021 Name: Sarah [name] MRN: [id number] DOB: 01/03/1946  Sarah [name] is a 76 y.o. year old female who sees Sarah [name], Malka So, MD for primary care. I spoke with  Sarah [name] & sister Sarah [name] Divers by phone today.  What matters to the patients health and wellness today?  Assist to locate nephrologist/ Dr Sarah [name], get access to nephrologist office portal, clarify next appointment  for nephrologist on 12/30/21 at 1:40 pm and requested a hospital follow up vs December visit with Sarah [name]    Goals Addressed               This Visit's Progress     Patient Stated     Confirm and schedule nephrology post hospital visit [facility]) (pt-stated)   Not on track     Care Coordination Interventions: Assessed the sister, Sarah [name] understanding of chronic kidney disease    Evaluation of current treatment plan related to chronic kidney disease self management and patient's adherence to plan as established by provider      Reviewed prescribed diet heart healthy (low sodium) modified carbohydrate diet Reviewed scheduled/upcoming provider appointments including    Discussed plans with patient for ongoing care management follow up and provided patient with direct contact information for care management team    Conference call to Dr Sarah [name]' s office to confirm correct nephrologist, get her sister access to the follow me nephrology portal and request a requested a post hospital office visit Spoke with Sarah [name] Chronic kidney disease/diet education sent via after summary visit      Other     COMPLETED: Decrease inpatient admissions/ readmissions with in the next year        Duplicated goal completed- change of RN CM       COMPLETED: Decrease inpatient diabetes admissions/readmissions with in the year        Duplicated goal completed- change of RN CM       COMPLETED: Decrease the use of hospital emergency department related to diabetes within the  next year         Duplicated goal completed- change of RN CM         SDOH assessments and interventions completed:  No     Care Coordination Interventions Activated:  Yes  Care Coordination Interventions:  Yes, provided   Follow up plan: Follow up call scheduled for 11/30/21    Encounter Outcome:  Pt. Visit Completed   Sarah [name] L. [name], RN, BSN, [place] Coordinator Office number [phone]

## 2021-11-04 NOTE — Therapy (Signed)
OUTPATIENT OCCUPATIONAL THERAPY NEURO EVALUATION  Patient Name: Sarah [name] MRN: [id number] DOB:1945/12/26, 76 y.o., female Today's Date: 11/05/2021  PCP: [name], [name], MD  REFERRING PROVIDER: PCP: [name], [name], MD    OT End of Session - 11/05/21 1151     Visit Number 1    Number of Visits 25    Date for OT Re-Evaluation 02/04/22    Authorization Type [employer] MCR    OT Start Time 0932    OT Stop Time 1015    OT Time Calculation (min) 43 min    Activity Tolerance Patient tolerated treatment well    Behavior During Therapy [facility] for tasks assessed/performed             Past Medical History:  Diagnosis Date   Bilateral carpal tunnel syndrome 03/27/2019   Boils    Glaucoma    Heart murmur    HTN (hypertension)    Hx of adenomatous colonic polyps    Hyperlipidemia    Osteoporosis    Pneumonia    Stroke ([place])    Type II or unspecified type diabetes mellitus without mention of complication, uncontrolled    Past Surgical History:  Procedure Laterality Date   ABDOMINAL HYSTERECTOMY     CARPAL TUNNEL RELEASE     right   COLONOSCOPY  12-10-10   per Dr. Deatra Ina, clear, repeat in 7 yrs    CORONARY STENT INTERVENTION N/A 03/11/2020   Procedure: CORONARY STENT INTERVENTION;  Surgeon: Jettie Booze, MD;  Location: [place] [facility];  Service: Cardiovascular;  Laterality: N/A;   INCISION AND DRAINAGE PERIRECTAL ABSCESS N/A 09/26/2015   Procedure: IRRIGATION AND DEBRIDEMENT PERIRECTAL ABSCESS;  Surgeon: Mickeal Skinner, MD;  Location: [place];  Service: General;  Laterality: N/A;   INTRAVASCULAR ULTRASOUND/IVUS N/A 03/11/2020   Procedure: Intravascular Ultrasound/IVUS;  Surgeon: Jettie Booze, MD;  Location: [place] [facility];  Service: Cardiovascular;  Laterality: N/A;   KNEE ARTHROSCOPY     right   LEFT HEART CATH AND CORONARY ANGIOGRAPHY N/A 03/11/2020   Procedure: LEFT HEART CATH AND CORONARY ANGIOGRAPHY;  Surgeon: Jettie Booze, MD;  Location: [place] [facility];  Service: Cardiovascular;  Laterality: N/A;   LOOP RECORDER INSERTION N/A 10/19/2016   Procedure: LOOP RECORDER INSERTION;  Surgeon: Thompson Grayer, MD;  Location: [place] [facility];  Service: Cardiovascular;  Laterality: N/A;   ORIF ANKLE FRACTURE Right 03/24/2012   Procedure: OPEN REDUCTION INTERNAL FIXATION (ORIF) ANKLE FRACTURE;  Surgeon: Newt Minion, MD;  Location: [place];  Service: Orthopedics;  Laterality: Right;  Open Reduction Internal Fixation Right Bimalleolar ankle fracture   POLYPECTOMY     TEE WITHOUT CARDIOVERSION N/A 10/18/2016   Procedure: TRANSESOPHAGEAL ECHOCARDIOGRAM (TEE);  Surgeon: Fay Records, MD;  Location: [facility]OS[facility];  Service: Cardiovascular;  Laterality: N/A;   TONSILLECTOMY     Patient Active Problem List   Diagnosis Date Noted   Elevated troponin    Change in mental status 10/24/2021   Long term current use of antithrombotics/antiplatelets 10/02/2021   Heart murmur, systolic 10/27/5100   Dizziness 04/08/2021   Long term (current) use of antithrombotics/antiplatelets 12/16/2020   Chronic constipation 12/16/2020   Colon cancer screening 12/16/2020   History of adenomatous polyp of colon 12/16/2020   Coronary artery disease of native artery of native heart with stable angina pectoris ([place]) 03/04/2020   Diabetes mellitus ([place]) 03/03/2020   Aortic atherosclerosis ([place]) 02/12/2020   Bilateral leg cramps 01/21/2020   Bilateral lower  extremity edema 01/21/2020   Anxiety disorder 01/21/2020   Type 2 diabetes mellitus with hyperglycemia, with long-term current use of insulin ([place]) 08/15/2019   Type 2 diabetes mellitus with diabetic polyneuropathy, with long-term current use of insulin ([place]) 08/15/2019   Uncontrolled type 2 diabetes mellitus with hypoglycemia without coma ([place]) 08/15/2019   Hyperlipidemia LDL goal <70 08/15/2019   Bilateral carpal tunnel syndrome 03/27/2019   Osteoarthritis of both knees 11/27/2018   CKD (chronic kidney disease),  stage II 11/27/2018   GERD (gastroesophageal reflux disease) 06/26/2018   Homonymous hemianopia, left 02/08/2018   Cerebrovascular accident (CVA) due to embolism of cerebral artery ([place])    Diabetes mellitus with coincident hypertension ([place])    Trigger finger, right index finger 10/17/2017   Low back pain 07/29/2017   Carotid artery stenosis 11/02/2016   Left arm weakness    Diastolic dysfunction    Hypertension with heart disease    Acute blood loss anemia    Cerebral infarction due to embolism of right middle cerebral artery ([place])    Perirectal abscess 09/26/2015   Vitamin D deficiency 07/23/2015   Osteopenia 02/16/2013   Ankle fracture 01/19/2013   Anemia 09/27/2011   Type II diabetes mellitus with nephropathy ([place]) 09/24/2011   Mixed hyperlipidemia 09/24/2011   Primary hypertension 09/24/2011    ONSET DATE: 10/28/2021 referral date (onset date 10/23/21)   REFERRING DIAG: I63.9 (ICD-10-CM) - Cerebrovascular accident (CVA), unspecified mechanism ([place])   MRI brain: Punctate acute infarcts of the left cerebellum and left parietal lobe without hemorrhage or mass effect. Old posterior right MCA territory infarct and punctate old right cerebellar infarct   THERAPY DIAG:  Visuospatial deficit  Other lack of coordination  Muscle weakness (generalized)  Other symptoms and signs involving cognitive functions following cerebral infarction  Unsteadiness on feet  Rationale for Evaluation and Treatment Rehabilitation  SUBJECTIVE:   SUBJECTIVE STATEMENT: Denies UE pain Pt accompanied by:  SISTER  PERTINENT HISTORY:  The pt is a 76 yo female presenting 10/13 with AMS. Upon work up, pt with elevated troponin, and MRI showed punctate left cerebellum and left parietal infarcts. PMH includes: DM II, HTN, loop recorder, ORIF right ankle 2014, and CVA x2 (R MCA CVA in 2018 and R parieto-occipital CVA in 2020 w/ residual Lt sided weakness and field cut)  PRECAUTIONS: Other: fall risk,  heart monitor, no driving  WEIGHT BEARING RESTRICTIONS: No  PAIN:  Are you having pain?  Rt leg and back - O.T. not addressing (chronic/pre-existing from arthritis)   FALLS: Has patient fallen in last 6 months? Yes. Number of falls 1  LIVING ENVIRONMENT: Lives with:  lives w/ mother (who has Alzheimers) and nephew , siblings rotate providing care Lives in: House/apartment (bedroom upstairs). 1 step to enter from outside Has following equipment at home: Single point cane, Quad cane small base, Walker - 2 wheeled, shower chair, Grab bars, and rollator  PLOF: Independent and Leisure: puzzles  PATIENT GOALS: work on strength, balance, and vision  OBJECTIVE:   HAND DOMINANCE: Right  ADLs:  Transfers/ambulation related to ADLs: using rollator Eating: mod I w/ some spills Grooming: mod I  UB Dressing: min assist w/ bra LB Dressing: mod I w/ slip on shoes and elastic pants Toileting: mod I  Bathing: seated sponge bathing w/ set up  Tub Shower transfers: walk in shower w/ shower chair and grab bars but hasn't done since d/c from hospital   IADLs: (however pt did prior to recent stroke)  Shopping: dependent now  Light housekeeping: dependent now  Meal Prep: can get a snack, but not cooking Community mobility: relies on family for transportation Medication management: siblings oversees (pillbox, timers on phone)  Financial management: sister performing now (pt was doing)  Handwriting: 90% legible  MOBILITY STATUS:  uses rollator   UPPER EXTREMITY ROM:  BUE AROM WFL's w/ noted weakness end range sh flex deficits LUE (from older strokes) and Lt index DIP flexion (? Mallet finger). Slower to process movement   UPPER EXTREMITY MMT:   RUE MMT grossly 5/5, LUE MMT grossly 4/5  HAND FUNCTION: Grip strength: Right: 30.8 lbs; Left: 18.9 lbs  COORDINATION: 9 Hole Peg test: Right: 46.31 sec; Left: 70.28  sec (difficulty following directions)  Slightly ataxic Lt hand and has difficulty  reaching handle of walker (but also some vision and attention deficits to Lt side)   SENSATION: Intact for light touch/localization  EDEMA: none in UE's   COGNITION: Overall cognitive status:  decreased processing speed and short term memory deficits . Difficulty following directions for 9 hole peg test  VISION: Subjective report: vision changes w/ second stroke (Lt homonymous hemianopsia?), however pt reports judging distances and increased bluriness changed w/ this stroke Baseline vision: Wears glasses for reading only Visual history: cataracts  VISION ASSESSMENT: TBA  further next session  Patient has difficulty with following activities due to following visual impairments: judging distances, seeing to Lt side for near body and environmental scanning  PERCEPTION: Impaired: Inattention/neglect: does not attend to left visual field and does not attend to left side of body (from 2nd stroke)     TODAY'S TREATMENT:                                                                         DATE:  N/A today  PATIENT EDUCATION: Education details: OT POC  Person educated: Patient and SISTER Education method: Explanation Education comprehension: verbalized understanding  HOME EXERCISE PROGRAM: N/A today   GOALS: Goals reviewed with patient? Yes  SHORT TERM GOALS: Target date: 12/06/21  Independent w/ coordination HEP w/ focus on Rt hand (from new stroke) however also Lt hand (modifications prn)  Baseline: Goal status: INITIAL  2.  Pt to improve coordination Rt hand as evidenced by performing 9 hole peg test in 40 sec or less Baseline: 46.31 sec Goal status: INITIAL  3.  Pt to don bra I'ly Baseline: min assist Goal status: INITIAL  4.  Pt to report less spills w/ eating and increase writing legibility to 100% Rt hand Baseline: 90% Goal status: INITIAL  5.  Pt to perform shower transfers and bathing in shower w/ close supervision Baseline:  Goal status: INITIAL  6.   Pt to perform tabletop scanning attending to Lt side w/ no more than min cues Baseline:  Goal status: INITIAL  LONG TERM GOALS: Target date: 02/04/22  Independent with updated HEP for UE strengthening/endurance and putty HEP  Baseline:  Goal status: INITIAL  2.  Pt to perform light meal prep w/ task modifications and A/E prn with supervision Baseline:  Goal status: INITIAL  3.  Pt to perform light IADLS (washing dishes, laundry, folding, making bed) w/ distant supervision and no LOB Baseline:  Goal status: INITIAL  4.  Pt to perform environmental scanning with 85% or greater accuracy (attending to Lt side)  Baseline:  Goal status: INITIAL  5.  Pt to perform financial management task with sup/no more than min cues Baseline:  Goal status: INITIAL  6.  Pt to improve coordination bilaterally as evidenced by performing 9 hole peg test in 35 sec or less Rt hand and under 60 sec Lt hand Baseline: Rt = 46 sec, Lt = 70 sec Goal status: INITIAL  ASSESSMENT:  CLINICAL IMPRESSION: Patient is a 76 y.o. female who was seen today for occupational therapy evaluation s/p Lt cerebellar and parietal CVA on 10/23/21 w/ new changes in balance, vision, strength/endurance, and coordination Rt side. Pt also appears to have cognitive deficits (unsure if this is new or just worse from last stroke). Pt w/ 2 previous Rt strokes in 2018 and 2020 affecting Lt side and vision. Pt would benefit from skilled O.T. to address these deficits and improve independence, safety and ease w/ ADL's to decrease caregiver burden of care.   PERFORMANCE DEFICITS: in functional skills including ADLs, IADLs, coordination, dexterity, proprioception, sensation, strength, Fine motor control, Gross motor control, mobility, balance, body mechanics, endurance, decreased knowledge of precautions, decreased knowledge of use of DME, vision, and UE functional use, cognitive skills including attention, memory, problem solving, and safety  awareness, and psychosocial skills including coping strategies.   IMPAIRMENTS: are limiting patient from ADLs, IADLs, leisure, and social participation.   CO-MORBIDITIES; has co-morbidities such as residual deficits from previous strokes  that affects occupational performance. Patient will benefit from skilled OT to address above impairments and improve overall function.  MODIFICATION OR ASSISTANCE TO COMPLETE EVALUATION: Min-Moderate modification of tasks or assist with assess necessary to complete an evaluation.  OT OCCUPATIONAL PROFILE AND HISTORY: Detailed assessment: Review of records and additional review of physical, cognitive, psychosocial history related to current functional performance.  CLINICAL DECISION MAKING: Moderate - several treatment options, min-mod task modification necessary  REHAB POTENTIAL: Good  EVALUATION COMPLEXITY: Moderate    PLAN:  OT FREQUENCY: 2x/week  OT DURATION: 12 weeks plus eval  PLANNED INTERVENTIONS: self care/ADL training, therapeutic exercise, therapeutic activity, neuromuscular re-education, manual therapy, passive range of motion, functional mobility training, aquatic therapy, patient/family education, cognitive remediation/compensation, visual/perceptual remediation/compensation, coping strategies training, and DME and/or AE instructions  RECOMMENDED OTHER SERVICES: none at this time  CONSULTED AND AGREED WITH PLAN OF CARE: Patient and family member/caregiver  PLAN FOR NEXT SESSION: further assess vision and visual fields, coordination HEP    Hans Eden, OT 11/05/2021, 11:52 AM

## 2021-11-04 NOTE — Telephone Encounter (Signed)
FYI patients sister called and and wanted to let you know patients heart monitor was delivered and was on their porch when they got home from the appointment with you today.

## 2021-11-04 NOTE — Telephone Encounter (Signed)
Yes, I reached out to Markus Daft who looked it up and said it was delivered today around the time patient was at todays appointment. Thank you!

## 2021-11-05 ENCOUNTER — Encounter: Payer: Self-pay | Admitting: Occupational Therapy

## 2021-11-05 ENCOUNTER — Telehealth: Payer: Self-pay | Admitting: Physical Therapy

## 2021-11-05 ENCOUNTER — Ambulatory Visit: Payer: Medicare Other | Attending: Internal Medicine | Admitting: Occupational Therapy

## 2021-11-05 ENCOUNTER — Ambulatory Visit: Payer: Medicare Other | Admitting: Physical Therapy

## 2021-11-05 DIAGNOSIS — I69318 Other symptoms and signs involving cognitive functions following cerebral infarction: Secondary | ICD-10-CM | POA: Insufficient documentation

## 2021-11-05 DIAGNOSIS — R262 Difficulty in walking, not elsewhere classified: Secondary | ICD-10-CM | POA: Insufficient documentation

## 2021-11-05 DIAGNOSIS — I6340 Cerebral infarction due to embolism of unspecified cerebral artery: Secondary | ICD-10-CM | POA: Diagnosis not present

## 2021-11-05 DIAGNOSIS — Z8673 Personal history of transient ischemic attack (TIA), and cerebral infarction without residual deficits: Secondary | ICD-10-CM | POA: Diagnosis not present

## 2021-11-05 DIAGNOSIS — R2689 Other abnormalities of gait and mobility: Secondary | ICD-10-CM | POA: Insufficient documentation

## 2021-11-05 DIAGNOSIS — R41842 Visuospatial deficit: Secondary | ICD-10-CM | POA: Insufficient documentation

## 2021-11-05 DIAGNOSIS — I4891 Unspecified atrial fibrillation: Secondary | ICD-10-CM | POA: Diagnosis not present

## 2021-11-05 DIAGNOSIS — M6281 Muscle weakness (generalized): Secondary | ICD-10-CM | POA: Insufficient documentation

## 2021-11-05 DIAGNOSIS — R2681 Unsteadiness on feet: Secondary | ICD-10-CM | POA: Insufficient documentation

## 2021-11-05 DIAGNOSIS — R278 Other lack of coordination: Secondary | ICD-10-CM | POA: Insufficient documentation

## 2021-11-05 NOTE — Therapy (Signed)
OUTPATIENT PHYSICAL THERAPY NEURO EVALUATION   Patient Name: Sarah [name] MRN: [id number] DOB:03-01-1945, 76 y.o., female Today's Date: 11/05/2021   PCP: [name], [name], MD REFERRING PROVIDER: Modena Jansky, MD    PT End of Session - 11/05/21 1021     Visit Number 1    Number of Visits 76   with eval   Date for PT Re-Evaluation 01/14/22   to allow for scheduling delays   Authorization Type [employer] Medicare    Progress Note Due on Visit 10    PT Start Time 1020   from OT eval   PT Stop Time 1055   PT eval   PT Time Calculation (min) 35 min    Activity Tolerance Patient tolerated treatment well    Behavior During Therapy [facility] for tasks assessed/performed             Past Medical History:  Diagnosis Date   Bilateral carpal tunnel syndrome 03/27/2019   Boils    Glaucoma    Heart murmur    HTN (hypertension)    Hx of adenomatous colonic polyps    Hyperlipidemia    Osteoporosis    Pneumonia    Stroke ([place])    Type II or unspecified type diabetes mellitus without mention of complication, uncontrolled    Past Surgical History:  Procedure Laterality Date   ABDOMINAL HYSTERECTOMY     CARPAL TUNNEL RELEASE     right   COLONOSCOPY  12-10-10   per Dr. Deatra Ina, clear, repeat in 7 yrs    CORONARY STENT INTERVENTION N/A 03/11/2020   Procedure: CORONARY STENT INTERVENTION;  Surgeon: Jettie Booze, MD;  Location: [place] [facility];  Service: Cardiovascular;  Laterality: N/A;   INCISION AND DRAINAGE PERIRECTAL ABSCESS N/A 09/26/2015   Procedure: IRRIGATION AND DEBRIDEMENT PERIRECTAL ABSCESS;  Surgeon: Mickeal Skinner, MD;  Location: [place];  Service: General;  Laterality: N/A;   INTRAVASCULAR ULTRASOUND/IVUS N/A 03/11/2020   Procedure: Intravascular Ultrasound/IVUS;  Surgeon: Jettie Booze, MD;  Location: [place] [facility];  Service: Cardiovascular;  Laterality: N/A;   KNEE ARTHROSCOPY     right   LEFT HEART CATH AND CORONARY ANGIOGRAPHY N/A 03/11/2020    Procedure: LEFT HEART CATH AND CORONARY ANGIOGRAPHY;  Surgeon: Jettie Booze, MD;  Location: [place] [facility];  Service: Cardiovascular;  Laterality: N/A;   LOOP RECORDER INSERTION N/A 10/19/2016   Procedure: LOOP RECORDER INSERTION;  Surgeon: Thompson Grayer, MD;  Location: [place] [facility];  Service: Cardiovascular;  Laterality: N/A;   ORIF ANKLE FRACTURE Right 03/24/2012   Procedure: OPEN REDUCTION INTERNAL FIXATION (ORIF) ANKLE FRACTURE;  Surgeon: Newt Minion, MD;  Location: [place];  Service: Orthopedics;  Laterality: Right;  Open Reduction Internal Fixation Right Bimalleolar ankle fracture   POLYPECTOMY     TEE WITHOUT CARDIOVERSION N/A 10/18/2016   Procedure: TRANSESOPHAGEAL ECHOCARDIOGRAM (TEE);  Surgeon: Fay Records, MD;  Location: [facility]OS[facility];  Service: Cardiovascular;  Laterality: N/A;   TONSILLECTOMY     Patient Active Problem List   Diagnosis Date Noted   Elevated troponin    Change in mental status 10/24/2021   Long term current use of antithrombotics/antiplatelets 10/02/2021   Heart murmur, systolic 53/29/9242   Dizziness 04/08/2021   Long term (current) use of antithrombotics/antiplatelets 12/16/2020   Chronic constipation 12/16/2020   Colon cancer screening 12/16/2020   History of adenomatous polyp of colon 12/16/2020   Coronary artery disease of native artery of native heart with stable angina  pectoris ([place]) 03/04/2020   Diabetes mellitus ([place]) 03/03/2020   Aortic atherosclerosis ([place]) 02/12/2020   Bilateral leg cramps 01/21/2020   Bilateral lower extremity edema 01/21/2020   Anxiety disorder 01/21/2020   Type 2 diabetes mellitus with hyperglycemia, with long-term current use of insulin ([place]) 08/15/2019   Type 2 diabetes mellitus with diabetic polyneuropathy, with long-term current use of insulin ([place]) 08/15/2019   Uncontrolled type 2 diabetes mellitus with hypoglycemia without coma ([place]) 08/15/2019   Hyperlipidemia LDL goal <70 08/15/2019   Bilateral carpal  tunnel syndrome 03/27/2019   Osteoarthritis of both knees 11/27/2018   CKD (chronic kidney disease), stage II 11/27/2018   GERD (gastroesophageal reflux disease) 06/26/2018   Homonymous hemianopia, left 02/08/2018   Cerebrovascular accident (CVA) due to embolism of cerebral artery ([place])    Diabetes mellitus with coincident hypertension ([place])    Trigger finger, right index finger 10/17/2017   Low back pain 07/29/2017   Carotid artery stenosis 11/02/2016   Left arm weakness    Diastolic dysfunction    Hypertension with heart disease    Acute blood loss anemia    Cerebral infarction due to embolism of right middle cerebral artery ([place])    Perirectal abscess 09/26/2015   Vitamin D deficiency 07/23/2015   Osteopenia 02/16/2013   Ankle fracture 01/19/2013   Anemia 09/27/2011   Type II diabetes mellitus with nephropathy ([place]) 09/24/2011   Mixed hyperlipidemia 09/24/2011   Primary hypertension 09/24/2011    ONSET DATE: 10/28/2021   REFERRING DIAG: I63.9 (ICD-10-CM) - Cerebrovascular accident (CVA), unspecified mechanism ([place])   THERAPY DIAG:  Other abnormalities of gait and mobility  Muscle weakness (generalized)  Unsteadiness on feet  Difficulty in walking, not elsewhere classified  Rationale for Evaluation and Treatment Rehabilitation  SUBJECTIVE:                                                                                                                                                                                             SUBJECTIVE STATEMENT: Pt reports she has been moving slowly since going home from the hospital. Pt reports she has been using a rollator since this 3rd stroke and received this upon d/c from the hospital. Prior to this stroke she was not using a device. Pt and her sister also report that the pt is now wearing a heart monitor. Pt reports history of a R ankle surgery about 5 years ago and had limited ROM in that ankle since then.  Pt accompanied by:  family member sister Sarah [name]  PERTINENT HISTORY: poorly controlled DM 2, hypertension, CVA, proteinuria, CKD 3, and CAD status post DES to RCA in  03/2020; hx of 3 CVAs   PAIN:  Are you having pain? Yes: NPRS scale: not rated/10 Pain location: RLE Pain description: achy, intermittent Aggravating factors: none reported Relieving factors: Tramadol, Tylenol  PRECAUTIONS: Fall  WEIGHT BEARING RESTRICTIONS: No  FALLS: Has patient fallen in last 6 months? Yes. Number of falls 1, fell bringing garbage cans in, RLE started hurting after that fall  LIVING ENVIRONMENT: Lives with: lives with their family; lives with mother and nephew, sister lives close by; rotating siblings Lives in: House/apartment Stairs: Yes: Internal: 12 steps; on right going up and External: 1 steps; none Has following equipment at home: Single point cane, Walker - 2 wheeled, and shower chair  PLOF: Independent with gait and Independent with transfers  PATIENT GOALS: "walking, vision"  OBJECTIVE:   DIAGNOSTIC FINDINGS:  brain MRI 10/24/21: IMPRESSION: 1. Punctate acute infarcts of the left cerebellum and left parietal lobe. No hemorrhage or mass effect. 2. Old posterior right MCA territory infarct and punctate old right cerebellar infarct.  Head angiogram 10/24/21: IMPRESSION: 1. No emergent finding. Accounting for motion artifact, no convincing change from 2021. 2. Bilateral M1 segment stenosis, particularly advanced on the right where there has been prior MCA territory infarct.  COGNITION: Overall cognitive status: Impaired; may have had memory deficits prior to this CVA   SENSATION: Reports N/T in RLE up to thigh but light touch appears intact Proprioception dec in distal LLE  COORDINATION: Slightly dysmetric movements in BLE  EDEMA:  History of B ankle swelling; medications are helping to control it  POSTURE: forward head  LOWER EXTREMITY MMT:    MMT Right Eval Left Eval  Hip flexion 4  (pain) 5  Hip extension    Hip abduction    Hip adduction    Hip internal rotation    Hip external rotation    Knee flexion 4 (pain) 5  Knee extension 4 (pain) 5  Ankle dorsiflexion 4 5  Ankle plantarflexion    Ankle inversion    Ankle eversion    (Blank rows = not tested)  *decreased DF ROM in R ankle due to surgery 5 years ago  BED MOBILITY:  Sister reports she does need some help due to significant bed height    TRANSFERS: Assistive device utilized: Environmental consultant - 4 wheeled  Sit to stand: SBA Stand to sit: SBA Chair to chair: SBA Floor:  not safe to assist on evaluation   GAIT: Gait pattern: decreased step length- Right, decreased step length- Left, decreased stride length, decreased hip/knee flexion- Right, decreased hip/knee flexion- Left, decreased ankle dorsiflexion- Right, decreased ankle dorsiflexion- Left, shuffling, and antalgic RLE Distance walked: various clinic distances Assistive device utilized: Environmental consultant - 4 wheeled Level of assistance: CGA Comments: also frequently running into obstacles with rollator and needs max cues for safe brake management and to safely turn and sit  FUNCTIONAL TESTS:    [facility] PT Assessment - 11/05/21 1040       Ambulation/Gait   Gait velocity 32.8 ft over 27.57 sec = 1.19 ft/sec      Standardized Balance Assessment   Standardized Balance Assessment Timed Up and Go Test;Five Times Sit to Stand    Five times sit to stand comments  16.68 sec   BUE pushing up from arms of chair     Timed Up and Go Test   TUG Normal TUG    Normal TUG (seconds) 59.52   with rollator            TODAY'S TREATMENT:  PT Evaluation  PATIENT EDUCATION: Education details: Eval findings, PT POC Person educated: Patient and sister Clinical cytogeneticist Education method: Explanation, Demonstration, Tactile cues, and Verbal cues Education comprehension: verbalized understanding and needs further education  HOME EXERCISE PROGRAM: To be established next  session   GOALS: Goals reviewed with patient? Yes  SHORT TERM GOALS: Target date: 12/03/2021  Pt will be independent with initial HEP for improved strength, balance, transfers and gait. Baseline: Goal status: INITIAL  2.  Pt will improve gait velocity to at least 1.5 ft/sec for improved gait efficiency and performance at S* level  Baseline: 1.19 ft/sec with rollator and S* (10/26) Goal status: INITIAL  3.  Pt will improve normal TUG to less than or equal to 45 seconds for improved functional mobility and decreased fall risk. Baseline: 59.52 sec with rollator Goal status: INITIAL  4.  Berg to be assessed and STG set Baseline:  Goal status: INITIAL   LONG TERM GOALS: Target date: 12/31/2021  Pt will be independent with final HEP for improved strength, balance, transfers and gait. Baseline:  Goal status: INITIAL  2.  Pt will improve gait velocity to at least 2.0 ft/sec for improved gait efficiency and performance at S* level  Baseline: 1.19 ft/sec with S* and rollator (10/26) Goal status: INITIAL  3.  Pt will improve normal TUG to less than or equal to 30 seconds for improved functional mobility and decreased fall risk. Baseline: 59.52 sec with rollator Goal status: INITIAL  4.  Berg to be assessed and LTG set Baseline:  Goal status: INITIAL   ASSESSMENT:  CLINICAL IMPRESSION: Patient is a 76 year old female referred to Neuro OPPT for CVA.   Pt's PMH is significant for: poorly controlled DM 2, hypertension, CVA, proteinuria, CKD 3, and CAD status post DES to RCA in 03/2020 with hx of 3 CVAs.  The following deficits were present during the exam: decreased LE strength, decreased gait speed, increased fall risk. Based on her gait speed of 1.19 ft/sec and TUG score of 59.52 sec, pt is an increased risk for falls. Pt would benefit from skilled PT to address these impairments and functional limitations to maximize functional mobility independence.  OBJECTIVE IMPAIRMENTS:  Abnormal gait, cardiopulmonary status limiting activity, decreased activity tolerance, decreased balance, decreased cognition, decreased coordination, decreased endurance, decreased knowledge of condition, decreased knowledge of use of DME, decreased mobility, difficulty walking, decreased ROM, decreased strength, decreased safety awareness, impaired sensation, and impaired vision/preception.   ACTIVITY LIMITATIONS: carrying, lifting, bending, squatting, stairs, transfers, and bed mobility  PARTICIPATION LIMITATIONS: driving and community activity  PERSONAL FACTORS: Age and 1-2 comorbidities:    poorly controlled DM 2, hypertension, CVA, proteinuria, CKD 3, and CAD status post DES to RCA in 03/2020 are also affecting patient's functional outcome.   REHAB POTENTIAL: Good  CLINICAL DECISION MAKING: Stable/uncomplicated  EVALUATION COMPLEXITY: Moderate  PLAN:  PT FREQUENCY: 2x/week  PT DURATION: 8 weeks  PLANNED INTERVENTIONS: Therapeutic exercises, Therapeutic activity, Neuromuscular re-education, Balance training, Gait training, Patient/Family education, Self Care, Joint mobilization, Stair training, Vestibular training, Canalith repositioning, Visual/preceptual remediation/compensation, Orthotic/Fit training, DME instructions, Aquatic Therapy, Dry Needling, Cognitive remediation, Electrical stimulation, Cryotherapy, Moist heat, Taping, Manual therapy, and Re-evaluation  PLAN FOR NEXT SESSION: assess Berg and write STG/LTG, initiate HEP, work on safe rollator use with transfers and brake management, work on bed mobility from elevated surface (have sister measure bed height), increasing step length and LE clearance with gait   Excell Seltzer, PT, DPT, CSRS 11/05/2021, 11:00 AM

## 2021-11-05 NOTE — Telephone Encounter (Signed)
Dr. Algis Liming,  Sarah [name] was evaluated by Physical Therapy on 11/05/2021.  The patient would benefit from an Speech Therapy evaluation for memory and cognitive deficits.    If you agree, please place an order in [facility] workque in [facility] or fax the order to [phone]. Thank you, Excell Seltzer, PT, DPT, [facility] [address] [place], [place]  [address] Phone:  [phone] Fax:  [phone]

## 2021-11-05 NOTE — Patient Instructions (Addendum)
Visit Information  Thank you for taking time to visit with me today. Please don't hesitate to contact me if I can be of assistance to you.   Following are the goals we discussed today:   Goals Addressed               This Visit's Progress     Patient Stated     manage/reconcilliation of medications ([facility]) (pt-stated)   Not on track     Care Coordination Interventions: Reviewed medications with patient and discussed importance of medication adherence Discussed plans with patient for ongoing care management follow up and provided patient with direct contact information for care management team Sent EPIC message to Dr [name] requesting assistance with refill prescription  for victoza and to adjust atorvastatin prescription to 40 mg 2 tabs (totaling 80 mg) to assist with noted swallowing difficulties of the 80 mg tablet      Other     Obtain Safe and Stable Transportation   On track     Care Coordination Interventions: Solution-Focused Strategies employed:  Active listening / Reflection utilized  Emotional Support Provided LCSW contacted pt's insurance provider inquiring about transportation services. LCSW was informed pt has emergency transportation only LCSW completed Access [place] application         Our next appointment is by telephone on 11/30/21 at 1:30 pm  Please call the care guide team at [phone] if you need to cancel or reschedule your appointment.   If you are experiencing a Mental Health or [place] or need someone to talk to, please call the Suicide and Crisis Lifeline: 988 call the [place] National Suicide Prevention Lifeline: (551)715-3478 or TTY: 803-269-7809 TTY 806 560 7104) to talk to a trained counselor call 1-800-273-TALK (toll free, 24 hour hotline) go to [facility] [address] [place] [phone]) call 911   Patient verbalizes understanding of instructions and care plan provided today and  agrees to view in [place]. Active MyChart status and patient understanding of how to access instructions and care plan via MyChart confirmed with patient.     The patient has been provided with contact information for the care management team and has been advised to call with any health related questions or concerns.   Niaomi Cartaya L. [name], RN, BSN, [place] Coordinator Office number [phone]

## 2021-11-05 NOTE — Progress Notes (Signed)
I agree with the above plan 

## 2021-11-05 NOTE — Patient Instructions (Addendum)
Visit Information  Thank you for taking time to visit with me today. Please don't hesitate to contact me if I can be of assistance to you.   Following are the goals we discussed today:   Goals Addressed               This Visit's Progress     Patient Stated     Confirm and schedule nephrology post hospital visit [facility]) (pt-stated)        Care Coordination Interventions: Assessed the sister, [name] understanding of chronic kidney disease    Evaluation of current treatment plan related to chronic kidney disease self management and patient's adherence to plan as established by provider      Reviewed prescribed diet heart healthy (low sodium) modified carbohydrate diet Reviewed scheduled/upcoming provider appointments including    Discussed plans with patient for ongoing care management follow up and provided patient with direct contact information for care management team    Conference call to Dr Johnney Ou' s office to confirm correct nephrologist, get her sister access to the follow me nephrology portal and request a requested a post hospital office visit Spoke with [name] education on chronic kidney disease/diet via her after summary visit      Our next appointment is by telephone on 11/30/21 at 1100  Please call the care guide team at [phone] if you need to cancel or reschedule your appointment.   If you are experiencing a Mental Health or [place] or need someone to talk to, please call the Suicide and Crisis Lifeline: 988 call the [place] National Suicide Prevention Lifeline: 938-671-8171 or TTY: (847) 397-5194 TTY 228-006-3744) to talk to a trained counselor call 1-800-273-TALK (toll free, 24 hour hotline) go to [facility] [address], [place] [phone]) call 911   Patient verbalizes understanding of instructions and care plan provided today and agrees to view in [place]. Active MyChart status and patient  understanding of how to access instructions and care plan via MyChart confirmed with patient.     The patient has been provided with contact information for the care management team and has been advised to call with any health related questions or concerns.   Sarah [name] L. [name], RN, BSN, [place] Coordinator Office number [phone]

## 2021-11-05 NOTE — Telephone Encounter (Signed)
Dr. [name]   Gianny Bourcier was evaluated by Physical Therapy on 11/05/2021.  The patient would benefit from an Speech Therapy evaluation for memory and cognitive deficits.     If you agree, please place an order in [facility] workque in [facility] or fax the order to [phone]. Thank you, Excell Seltzer, PT, DPT, [facility]  [address] [place], [place]  [address] Phone:  [phone] Fax:  [phone]

## 2021-11-06 ENCOUNTER — Telehealth: Payer: Self-pay | Admitting: Family Medicine

## 2021-11-06 NOTE — Telephone Encounter (Signed)
Pt's sister concerned because patient has not had a bowel movement in a week. Seeking guidance. Patient is recovering from a stroke

## 2021-11-09 DIAGNOSIS — H469 Unspecified optic neuritis: Secondary | ICD-10-CM | POA: Diagnosis not present

## 2021-11-09 DIAGNOSIS — H40013 Open angle with borderline findings, low risk, bilateral: Secondary | ICD-10-CM | POA: Diagnosis not present

## 2021-11-09 NOTE — Telephone Encounter (Signed)
I left patient a voicemail to return my call. 

## 2021-11-09 NOTE — Telephone Encounter (Signed)
I tried to contact patient via cell phone number, but call could not be completed.

## 2021-11-10 ENCOUNTER — Ambulatory Visit: Payer: Medicare Other | Admitting: Physical Therapy

## 2021-11-10 VITALS — BP 134/58 | HR 83

## 2021-11-10 DIAGNOSIS — R41842 Visuospatial deficit: Secondary | ICD-10-CM | POA: Diagnosis not present

## 2021-11-10 DIAGNOSIS — R278 Other lack of coordination: Secondary | ICD-10-CM | POA: Diagnosis not present

## 2021-11-10 DIAGNOSIS — R2681 Unsteadiness on feet: Secondary | ICD-10-CM | POA: Diagnosis not present

## 2021-11-10 DIAGNOSIS — M6281 Muscle weakness (generalized): Secondary | ICD-10-CM

## 2021-11-10 DIAGNOSIS — R2689 Other abnormalities of gait and mobility: Secondary | ICD-10-CM | POA: Diagnosis not present

## 2021-11-10 DIAGNOSIS — R262 Difficulty in walking, not elsewhere classified: Secondary | ICD-10-CM | POA: Diagnosis not present

## 2021-11-10 DIAGNOSIS — I69318 Other symptoms and signs involving cognitive functions following cerebral infarction: Secondary | ICD-10-CM | POA: Diagnosis not present

## 2021-11-10 NOTE — Telephone Encounter (Signed)
I spoke with pt's daughter. Patient is doing better & has had bowel movements. They will let us know if anything changes.

## 2021-11-10 NOTE — Patient Instructions (Signed)
Visit Information  Thank you for taking time to visit with me today. Please don't hesitate to contact me if I can be of assistance to you.   Following are the goals we discussed today:   Goals Addressed             This Visit's Progress    Obtain Safe and Stable Transportation   On track    Care Coordination Interventions: Solution-Focused Strategies employed:  Active listening / Reflection utilized  Emotional Support Provided Caregiver stress acknowledged  Verbalization of feelings encouraged  LCSW spoke with patient and pt's sister. Pt had a stroke resulting in ED visit 10/23/21 Per sister, pt is weak therefore, siblings are doing 24/7 care rotations. Pt reports siblings are "taking really good care of me" Family spoke with RN Joellyn Quails who assisted with scheduling f/up appts with specialists and PCP [name] requested LCSW email her transportation resources, home care for aid at [email or web address] Family are interested in options regarding meal delivery services, such as, Meals on Wheels. LCSW completed care guide referral Pt has not received a determination letter from [place]. LCSW will follow up with [name]  Family shared [facility] has not started, since pt's discharge from hospital. Pt is scheduled to go to [place] on 11/05/21 to start Occupational and Physical Therapy LCSW reviewed upcoming appts        Our next appointment is by telephone on 11/08 at 10 AM  Please call the care guide team at [phone] if you need to cancel or reschedule your appointment.   If you are experiencing a Mental Health or [place] or need someone to talk to, please call the Suicide and Crisis Lifeline: 988 call 911   Patient verbalizes understanding of instructions and care plan provided today and agrees to view in [place]. Active MyChart status and patient understanding of how to access instructions and care plan via MyChart confirmed with patient.     Christa See,  MSW, [email or web address] Phone [phone] 4:07 PM

## 2021-11-10 NOTE — Patient Outreach (Signed)
  Care Coordination   Follow Up Visit Note   11/10/2021 Name: Sarah [name] MRN: [id number] DOB: 04/09/1945  Sarah [name] is a 76 y.o. year old female who sees [name], Malka So, MD for primary care. I spoke with  [name] and [name], pt's sister by phone today.  What matters to the patients health and wellness today?  Caregiver stress and Supportive resources    Goals Addressed             This Visit's Progress    Obtain Safe and Stable Transportation   On track    Care Coordination Interventions: Solution-Focused Strategies employed:  Active listening / Reflection utilized  Emotional Support Provided Caregiver stress acknowledged  Verbalization of feelings encouraged  LCSW spoke with patient and pt's sister. Pt had a stroke resulting in ED visit 10/23/21 Per sister, pt is weak therefore, siblings are doing 24/7 care rotations. Pt reports siblings are "taking really good care of me" Family spoke with RN Joellyn Quails who assisted with scheduling f/up appts with specialists and PCP [name] requested LCSW email her transportation resources, home care for aid at [email or web address] Family are interested in options regarding meal delivery services, such as, Meals on Wheels. LCSW completed care guide referral Pt has not received a determination letter from [place]. LCSW will follow up with [name]  Family shared [facility] has not started, since pt's discharge from hospital. Pt is scheduled to go to [place] on 11/05/21 to start Occupational and Physical Therapy LCSW reviewed upcoming appts        SDOH assessments and interventions completed:  No     Care Coordination Interventions Activated:  Yes  Care Coordination Interventions:  Yes, provided   Follow up plan: Follow up call scheduled for 4-6 weeks    Encounter Outcome:  Pt. Visit Completed   Christa See, MSW, [email or web address] Phone [phone] 4:06 PM

## 2021-11-10 NOTE — Therapy (Signed)
OUTPATIENT PHYSICAL THERAPY NEURO TREATMENT   Patient Name: Sarah [name] MRN: [id number] DOB:07-Nov-1945, 76 y.o., female Today's Date: 11/10/2021   PCP: [name], [name], MD REFERRING PROVIDER: Modena Jansky, MD    PT End of Session - 11/10/21 1451     Visit Number 2    Number of Visits 17   with eval   Date for PT Re-Evaluation 01/14/22   to allow for scheduling delays   Authorization Type [employer] Medicare    Progress Note Due on Visit 10    PT Start Time 1448    PT Stop Time 1531   Pt using restroom for 5 minutes - nonbilled time   PT Time Calculation (min) 43 min    Equipment Utilized During Treatment Gait belt    Activity Tolerance Patient tolerated treatment well    Behavior During Therapy WFL for tasks assessed/performed   Easily distractable             Past Medical History:  Diagnosis Date   Bilateral carpal tunnel syndrome 03/27/2019   Boils    Glaucoma    Heart murmur    HTN (hypertension)    Hx of adenomatous colonic polyps    Hyperlipidemia    Osteoporosis    Pneumonia    Stroke ([place])    Type II or unspecified type diabetes mellitus without mention of complication, uncontrolled    Past Surgical History:  Procedure Laterality Date   ABDOMINAL HYSTERECTOMY     CARPAL TUNNEL RELEASE     right   COLONOSCOPY  12-10-10   per Dr. Deatra Ina, clear, repeat in 7 yrs    CORONARY STENT INTERVENTION N/A 03/11/2020   Procedure: CORONARY STENT INTERVENTION;  Surgeon: Jettie Booze, MD;  Location: [facility];  Service: Cardiovascular;  Laterality: N/A;   INCISION AND DRAINAGE PERIRECTAL ABSCESS N/A 09/26/2015   Procedure: IRRIGATION AND DEBRIDEMENT PERIRECTAL ABSCESS;  Surgeon: Mickeal Skinner, MD;  Location: [facility];  Service: General;  Laterality: N/A;   INTRAVASCULAR ULTRASOUND/IVUS N/A 03/11/2020   Procedure: Intravascular Ultrasound/IVUS;  Surgeon: Jettie Booze, MD;  Location: [facility];  Service: Cardiovascular;   Laterality: N/A;   KNEE ARTHROSCOPY     right   LEFT HEART CATH AND CORONARY ANGIOGRAPHY N/A 03/11/2020   Procedure: LEFT HEART CATH AND CORONARY ANGIOGRAPHY;  Surgeon: Jettie Booze, MD;  Location: [facility];  Service: Cardiovascular;  Laterality: N/A;   LOOP RECORDER INSERTION N/A 10/19/2016   Procedure: LOOP RECORDER INSERTION;  Surgeon: Thompson Grayer, MD;  Location: [facility];  Service: Cardiovascular;  Laterality: N/A;   ORIF ANKLE FRACTURE Right 03/24/2012   Procedure: OPEN REDUCTION INTERNAL FIXATION (ORIF) ANKLE FRACTURE;  Surgeon: Newt Minion, MD;  Location: [facility];  Service: Orthopedics;  Laterality: Right;  Open Reduction Internal Fixation Right Bimalleolar ankle fracture   POLYPECTOMY     TEE WITHOUT CARDIOVERSION N/A 10/18/2016   Procedure: TRANSESOPHAGEAL ECHOCARDIOGRAM (TEE);  Surgeon: Fay Records, MD;  Location: [facility];  Service: Cardiovascular;  Laterality: N/A;   TONSILLECTOMY     Patient Active Problem List   Diagnosis Date Noted   Elevated troponin    Change in mental status 10/24/2021   Long term current use of antithrombotics/antiplatelets 10/02/2021   Heart murmur, systolic 81/01/7508   Dizziness 04/08/2021   Long term (current) use of antithrombotics/antiplatelets 12/16/2020   Chronic constipation 12/16/2020   Colon cancer screening 12/16/2020   History of adenomatous polyp of  colon 12/16/2020   Coronary artery disease of native artery of native heart with stable angina pectoris ([place]) 03/04/2020   Diabetes mellitus ([place]) 03/03/2020   Aortic atherosclerosis ([place]) 02/12/2020   Bilateral leg cramps 01/21/2020   Bilateral lower extremity edema 01/21/2020   Anxiety disorder 01/21/2020   Type 2 diabetes mellitus with hyperglycemia, with long-term current use of insulin ([place]) 08/15/2019   Type 2 diabetes mellitus with diabetic polyneuropathy, with long-term current use of insulin ([place]) 08/15/2019   Uncontrolled type 2 diabetes mellitus  with hypoglycemia without coma ([place]) 08/15/2019   Hyperlipidemia LDL goal <70 08/15/2019   Bilateral carpal tunnel syndrome 03/27/2019   Osteoarthritis of both knees 11/27/2018   CKD (chronic kidney disease), stage II 11/27/2018   GERD (gastroesophageal reflux disease) 06/26/2018   Homonymous hemianopia, left 02/08/2018   Cerebrovascular accident (CVA) due to embolism of cerebral artery ([place])    Diabetes mellitus with coincident hypertension ([place])    Trigger finger, right index finger 10/17/2017   Low back pain 07/29/2017   Carotid artery stenosis 11/02/2016   Left arm weakness    Diastolic dysfunction    Hypertension with heart disease    Acute blood loss anemia    Cerebral infarction due to embolism of right middle cerebral artery ([place])    Perirectal abscess 09/26/2015   Vitamin D deficiency 07/23/2015   Osteopenia 02/16/2013   Ankle fracture 01/19/2013   Anemia 09/27/2011   Type II diabetes mellitus with nephropathy ([place]) 09/24/2011   Mixed hyperlipidemia 09/24/2011   Primary hypertension 09/24/2011    ONSET DATE: 10/28/2021   REFERRING DIAG: I63.9 (ICD-10-CM) - Cerebrovascular accident (CVA), unspecified mechanism ([place])   THERAPY DIAG:  Other abnormalities of gait and mobility  Muscle weakness (generalized)  Unsteadiness on feet  Rationale for Evaluation and Treatment Rehabilitation  SUBJECTIVE:                                                                                                                                                                                             SUBJECTIVE STATEMENT: Pt denies any changes since last visit.   Pt accompanied by: family member sister-in-law Sarah  PERTINENT HISTORY: poorly controlled DM 2, hypertension, CVA, proteinuria, CKD 3, and CAD status post DES to RCA in 03/2020; hx of 3 CVAs   PAIN:  Are you having pain? No  VITALS Vitals:   11/10/21 1456  BP: (!) 134/58  Pulse: 83     PRECAUTIONS: Fall  WEIGHT  BEARING RESTRICTIONS: No  FALLS: Has patient fallen in last 6 months? Yes. Number of falls 1, fell bringing garbage cans in, RLE started hurting after  that fall  LIVING ENVIRONMENT: Lives with: lives with their family; lives with mother and nephew, sister lives close by; rotating siblings Lives in: House/apartment Stairs: Yes: Internal: 12 steps; on right going up and External: 1 steps; none Has following equipment at home: Single point cane, Walker - 2 wheeled, and shower chair  PLOF: Independent with gait and Independent with transfers  PATIENT GOALS: "walking, vision"  OBJECTIVE:   DIAGNOSTIC FINDINGS:  brain MRI 10/24/21: IMPRESSION: 1. Punctate acute infarcts of the left cerebellum and left parietal lobe. No hemorrhage or mass effect. 2. Old posterior right MCA territory infarct and punctate old right cerebellar infarct.  Head angiogram 10/24/21: IMPRESSION: 1. No emergent finding. Accounting for motion artifact, no convincing change from 2021. 2. Bilateral M1 segment stenosis, particularly advanced on the right where there has been prior MCA territory infarct.  COGNITION: Overall cognitive status: Impaired; may have had memory deficits prior to this CVA   SENSATION: Reports N/T in RLE up to thigh but light touch appears intact Proprioception dec in distal LLE  TODAY'S TREATMENT Ther Act  Assessed pt's BP prior to session (see above) and encouraged pt to increase her fluid intake due to low diastolic BP.  [facility] PT Assessment - 11/10/21 1512       Balance   Balance Assessed Yes      Standardized Balance Assessment   Standardized Balance Assessment Berg Balance Test      Berg Balance Test   Sit to Stand Able to stand without using hands and stabilize independently    Standing Unsupported Able to stand 2 minutes with supervision    Sitting with Back Unsupported but Feet Supported on Floor or Stool Able to sit safely and securely 2 minutes    Stand to Sit Uses  backs of legs against chair to control descent    Transfers Needs one person to assist    Standing Unsupported with Eyes Closed Able to stand 10 seconds with supervision    Standing Unsupported with Feet Together Needs help to attain position and unable to hold for 15 seconds    From Standing, Reach Forward with Outstretched Arm Reaches forward but needs supervision   8"   From Standing Position, Pick up Object from Floor Unable to try/needs assist to keep balance   Posterior LOB requiring max A   From Standing Position, Turn to Look Behind Over each Shoulder Turn sideways only but maintains balance    Turn 360 Degrees Needs close supervision or verbal cueing    Standing Unsupported, Alternately Place Feet on Step/Stool Able to complete >2 steps/needs minimal assist    Standing Unsupported, One Foot in [employer] balance while stepping or standing    Standing on One Leg Tries to lift leg/unable to hold 3 seconds but remains standing independently   Pt closing eyes during task despite cues for EO   Total Score 23    Berg comment: High fall risk            During middle of Berg, pt abruptly reported need to use restroom for 5 minutes, so pt not billed for that time. Sarah accompanied her for safety.    Ther Ex  SciFit multi-peaks level 4 for 8 minutes using BUE/BLEs for neural priming for reciprocal movement, dynamic cardiovascular warmup and increased amplitude of stepping. Mod verbal cues to maintain full knee extension throughout (RLE>LLE). Max verbal cues to maintain movement throughout as pt frequently distracted and stopped activity ~30s. RPE of 5/10 following  activity    GAIT: Gait pattern:  ER of RLE, decreased step length- Right, decreased step length- Left, decreased stride length, decreased hip/knee flexion- Right, decreased hip/knee flexion- Left, decreased ankle dorsiflexion- Right, decreased ankle dorsiflexion- Left, shuffling, and antalgic RLE Distance walked: various clinic  distances Assistive device utilized: Walker - 4 wheeled Level of assistance: CGA Comments: also frequently running into obstacles on  L side with rollator and needs max cues for safe brake management. Max cues to sustain attention to task as pt easily distracted.   PATIENT EDUCATION: Education details: Warehouse manager, next appointment time  Person educated: Patient and sister-in-law Sarah  Education method: Explanation, Demonstration, Tactile cues, and Verbal cues Education comprehension: verbalized understanding  HOME EXERCISE PROGRAM: To be established next session   GOALS: Goals reviewed with patient? Yes  SHORT TERM GOALS: Target date: 12/03/2021  Pt will be independent with initial HEP for improved strength, balance, transfers and gait. Baseline: Goal status: INITIAL  2.  Pt will improve gait velocity to at least 1.5 ft/sec for improved gait efficiency and performance at S* level  Baseline: 1.19 ft/sec with rollator and S* (10/26) Goal status: INITIAL  3.  Pt will improve normal TUG to less than or equal to 45 seconds for improved functional mobility and decreased fall risk. Baseline: 59.52 sec with rollator Goal status: INITIAL  4.  Pt will improve Berg score to 28/56 for decreased fall risk  Baseline: 23/56 Goal status: REVISED   LONG TERM GOALS: Target date: 12/31/2021  Pt will be independent with final HEP for improved strength, balance, transfers and gait. Baseline:  Goal status: INITIAL  2.  Pt will improve gait velocity to at least 2.0 ft/sec for improved gait efficiency and performance at S* level  Baseline: 1.19 ft/sec with S* and rollator (10/26) Goal status: INITIAL  3.  Pt will improve normal TUG to less than or equal to 30 seconds for improved functional mobility and decreased fall risk. Baseline: 59.52 sec with rollator Goal status: INITIAL  4.  Pt will improve Berg score to 34/56 for decreased fall risk  Baseline: 23/56 Goal status:  REVISED   ASSESSMENT:  CLINICAL IMPRESSION: Emphasis of skilled PT session on balance assessment and endurance. Pt very easily distracted during session, requiring max cues for sustained attention to task every 30-45s w/activity. Noted L inattention w/gait, as pt frequently running into obstacles on L side. Pt scored a 23/56 on Berg, indicative of high fall risk. Noted most difficulty w/narrow BOS, reaching tasks and single leg stability. Continue POC.   OBJECTIVE IMPAIRMENTS: Abnormal gait, cardiopulmonary status limiting activity, decreased activity tolerance, decreased balance, decreased cognition, decreased coordination, decreased endurance, decreased knowledge of condition, decreased knowledge of use of DME, decreased mobility, difficulty walking, decreased ROM, decreased strength, decreased safety awareness, impaired sensation, and impaired vision/preception.   ACTIVITY LIMITATIONS: carrying, lifting, bending, squatting, stairs, transfers, and bed mobility  PARTICIPATION LIMITATIONS: driving and community activity  PERSONAL FACTORS: Age and 1-2 comorbidities:    poorly controlled DM 2, hypertension, CVA, proteinuria, CKD 3, and CAD status post DES to RCA in 03/2020 are also affecting patient's functional outcome.   REHAB POTENTIAL: Good  CLINICAL DECISION MAKING: Stable/uncomplicated  EVALUATION COMPLEXITY: Moderate  PLAN:  PT FREQUENCY: 2x/week  PT DURATION: 8 weeks  PLANNED INTERVENTIONS: Therapeutic exercises, Therapeutic activity, Neuromuscular re-education, Balance training, Gait training, Patient/Family education, Self Care, Joint mobilization, Stair training, Vestibular training, Canalith repositioning, Visual/preceptual remediation/compensation, Orthotic/Fit training, DME instructions, Aquatic Therapy, Dry Needling, Cognitive remediation, Electrical  stimulation, Cryotherapy, Moist heat, Taping, Manual therapy, and Re-evaluation  PLAN FOR NEXT SESSION: initiate HEP, work on  safe rollator use with transfers and brake management, work on bed mobility from elevated surface (have sister measure bed height), increasing step length and LE clearance with gait, turns, reaching out of BOS   Azyria Osmon E Josephine Wooldridge, PT, DPT 11/10/2021, 3:32 PM

## 2021-11-11 ENCOUNTER — Encounter: Payer: Medicare Other | Admitting: Gastroenterology

## 2021-11-11 ENCOUNTER — Other Ambulatory Visit: Payer: Self-pay | Admitting: Family Medicine

## 2021-11-11 DIAGNOSIS — I639 Cerebral infarction, unspecified: Secondary | ICD-10-CM

## 2021-11-12 ENCOUNTER — Ambulatory Visit: Payer: Medicare Other | Attending: Internal Medicine | Admitting: Occupational Therapy

## 2021-11-12 ENCOUNTER — Telehealth: Payer: Self-pay | Admitting: Family Medicine

## 2021-11-12 ENCOUNTER — Encounter: Payer: Self-pay | Admitting: Occupational Therapy

## 2021-11-12 ENCOUNTER — Ambulatory Visit (HOSPITAL_BASED_OUTPATIENT_CLINIC_OR_DEPARTMENT_OTHER): Payer: Medicare Other | Admitting: Physical Therapy

## 2021-11-12 ENCOUNTER — Telehealth: Payer: Self-pay | Admitting: Licensed Clinical Social Worker

## 2021-11-12 ENCOUNTER — Ambulatory Visit: Payer: Medicare Other | Admitting: Physical Therapy

## 2021-11-12 VITALS — BP 154/73 | HR 98

## 2021-11-12 DIAGNOSIS — R413 Other amnesia: Secondary | ICD-10-CM | POA: Insufficient documentation

## 2021-11-12 DIAGNOSIS — M6281 Muscle weakness (generalized): Secondary | ICD-10-CM | POA: Diagnosis not present

## 2021-11-12 DIAGNOSIS — R2689 Other abnormalities of gait and mobility: Secondary | ICD-10-CM | POA: Diagnosis not present

## 2021-11-12 DIAGNOSIS — R41841 Cognitive communication deficit: Secondary | ICD-10-CM | POA: Diagnosis present

## 2021-11-12 DIAGNOSIS — R278 Other lack of coordination: Secondary | ICD-10-CM | POA: Diagnosis not present

## 2021-11-12 DIAGNOSIS — I69318 Other symptoms and signs involving cognitive functions following cerebral infarction: Secondary | ICD-10-CM | POA: Insufficient documentation

## 2021-11-12 DIAGNOSIS — R41842 Visuospatial deficit: Secondary | ICD-10-CM | POA: Insufficient documentation

## 2021-11-12 DIAGNOSIS — R262 Difficulty in walking, not elsewhere classified: Secondary | ICD-10-CM | POA: Insufficient documentation

## 2021-11-12 DIAGNOSIS — R293 Abnormal posture: Secondary | ICD-10-CM | POA: Diagnosis not present

## 2021-11-12 DIAGNOSIS — R2681 Unsteadiness on feet: Secondary | ICD-10-CM | POA: Insufficient documentation

## 2021-11-12 NOTE — Patient Outreach (Signed)
  Care Coordination   Follow Up Visit Note   11/12/2021 Name: MALLORI ARAQUE MRN: [id number] DOB: 20-Dec-1945  DANDRIA GRIEGO is a 76 y.o. year old female who sees [name], Malka So, MD for primary care. I  spoke with [name] Divers, pt's sister  What matters to the patients health and wellness today?  Transportation update    Goals Addressed             This Visit's Progress    Obtain Safe and Stable Transportation   On track    Care Coordination Interventions: Solution-Focused Strategies employed:  Active listening / Reflection utilized  Emotional Support Provided Caregiver stress acknowledged  Verbalization of feelings encouraged  LCSW left message for Darrell Jewel with [employer] regarding update on submitted application Pt's sister, [name], shared that she has not received supportive resources via e-mail, per request LCSW verified e-mail address and re-sent information. Obtained confirmation that resources were received [name] has returned to [place]; however, pt's brother is providing supervision and assistance currently. No new needs discussed          SDOH assessments and interventions completed:  No     Care Coordination Interventions Activated:  Yes  Care Coordination Interventions:  Yes, provided   Follow up plan: Follow up call scheduled for 4-6 weeks    Encounter Outcome:  Pt. Visit Completed   Christa See, MSW, [email or web address] Phone [phone] 12:42 PM

## 2021-11-12 NOTE — Telephone Encounter (Signed)
Sister of Pt called to say Pt cannot take '80mg'$  of the Atorvastatin (atorvastatin (LIPITOR) 40 MG tablet twice a day)  and would like to if it would be okay for her to take only '40mg'$ ?  Please advise.     [id number] [address], [address] [place] Phone: [phone]  Fax: [phone]

## 2021-11-12 NOTE — Patient Instructions (Signed)
Visit Information  Thank you for taking time to visit with me today. Please don't hesitate to contact me if I can be of assistance to you.   Following are the goals we discussed today:   Goals Addressed             This Visit's Progress    Obtain Safe and Stable Transportation   On track    Care Coordination Interventions: Solution-Focused Strategies employed:  Active listening / Reflection utilized  Emotional Support Provided Caregiver stress acknowledged  Verbalization of feelings encouraged  LCSW left message for Darrell Jewel with [employer] regarding update on submitted application Pt's sister, [name], shared that she has not received supportive resources via e-mail, per request LCSW verified e-mail address and re-sent information. Obtained confirmation that resources were received [name] has returned to [place]; however, pt's brother is providing supervision and assistance currently. No new needs discussed          Our next appointment is by telephone on 11/8 at 10 AM  Please call the care guide team at [phone] if you need to cancel or reschedule your appointment.   If you are experiencing a Mental Health or Al[place] or need someone to talk to, please call the Suicide and Crisis Lifeline: 988 call 911   Patient verbalizes understanding of instructions and care plan provided today and agrees to view in [place]. Active MyChart status and patient understanding of how to access instructions and care plan via MyChart confirmed with patient.     Christa See, MSW, [email or web address] Phone [phone] 12:43 PM

## 2021-11-12 NOTE — Therapy (Signed)
OUTPATIENT PHYSICAL THERAPY NEURO TREATMENT   Patient Name: Sarah [name] MRN: [id number] DOB:1945-01-22, 76 y.o., female Today's Date: 11/12/2021   PCP: [name], [name], MD REFERRING PROVIDER: Modena Jansky, MD    PT End of Session - 11/12/21 1452     Visit Number 3    Number of Visits 17   with eval   Date for PT Re-Evaluation 01/14/22   to allow for scheduling delays   Authorization Type [employer]    Progress Note Due on Visit 10    PT Start Time 1450    PT Stop Time 1529    PT Time Calculation (min) 39 min    Equipment Utilized During Treatment Gait belt    Activity Tolerance Patient tolerated treatment well    Behavior During Therapy WFL for tasks assessed/performed   Easily distractable              Past Medical History:  Diagnosis Date   Bilateral carpal tunnel syndrome 03/27/2019   Boils    Glaucoma    Heart murmur    HTN (hypertension)    Hx of adenomatous colonic polyps    Hyperlipidemia    Osteoporosis    Pneumonia    Stroke ([place])    Type II or unspecified type diabetes mellitus without mention of complication, uncontrolled    Past Surgical History:  Procedure Laterality Date   ABDOMINAL HYSTERECTOMY     CARPAL TUNNEL RELEASE     right   COLONOSCOPY  12-10-10   per Dr. Deatra Ina, clear, repeat in 7 yrs    CORONARY STENT INTERVENTION N/A 03/11/2020   Procedure: CORONARY STENT INTERVENTION;  Surgeon: Jettie Booze, MD;  Location: [facility];  Service: Cardiovascular;  Laterality: N/A;   INCISION AND DRAINAGE PERIRECTAL ABSCESS N/A 09/26/2015   Procedure: IRRIGATION AND DEBRIDEMENT PERIRECTAL ABSCESS;  Surgeon: Mickeal Skinner, MD;  Location: [facility];  Service: General;  Laterality: N/A;   INTRAVASCULAR ULTRASOUND/IVUS N/A 03/11/2020   Procedure: Intravascular Ultrasound/IVUS;  Surgeon: Jettie Booze, MD;  Location: [facility];  Service: Cardiovascular;  Laterality: N/A;   KNEE ARTHROSCOPY     right   LEFT  HEART CATH AND CORONARY ANGIOGRAPHY N/A 03/11/2020   Procedure: LEFT HEART CATH AND CORONARY ANGIOGRAPHY;  Surgeon: Jettie Booze, MD;  Location: [place] [facility];  Service: Cardiovascular;  Laterality: N/A;   LOOP RECORDER INSERTION N/A 10/19/2016   Procedure: LOOP RECORDER INSERTION;  Surgeon: Thompson Grayer, MD;  Location: [place] [facility];  Service: Cardiovascular;  Laterality: N/A;   ORIF ANKLE FRACTURE Right 03/24/2012   Procedure: OPEN REDUCTION INTERNAL FIXATION (ORIF) ANKLE FRACTURE;  Surgeon: Newt Minion, MD;  Location: [facility];  Service: Orthopedics;  Laterality: Right;  Open Reduction Internal Fixation Right Bimalleolar ankle fracture   POLYPECTOMY     TEE WITHOUT CARDIOVERSION N/A 10/18/2016   Procedure: TRANSESOPHAGEAL ECHOCARDIOGRAM (TEE);  Surgeon: Fay Records, MD;  Location: [facility];  Service: Cardiovascular;  Laterality: N/A;   TONSILLECTOMY     Patient Active Problem List   Diagnosis Date Noted   Elevated troponin    Change in mental status 10/24/2021   Long term current use of antithrombotics/antiplatelets 10/02/2021   Heart murmur, systolic 22/63/3354   Dizziness 04/08/2021   Long term (current) use of antithrombotics/antiplatelets 12/16/2020   Chronic constipation 12/16/2020   Colon cancer screening 12/16/2020   History of adenomatous polyp of colon 12/16/2020   Coronary artery disease of native  artery of native heart with stable angina pectoris ([place]) 03/04/2020   Diabetes mellitus ([place]) 03/03/2020   Aortic atherosclerosis ([place]) 02/12/2020   Bilateral leg cramps 01/21/2020   Bilateral lower extremity edema 01/21/2020   Anxiety disorder 01/21/2020   Type 2 diabetes mellitus with hyperglycemia, with long-term current use of insulin ([place]) 08/15/2019   Type 2 diabetes mellitus with diabetic polyneuropathy, with long-term current use of insulin ([place]) 08/15/2019   Uncontrolled type 2 diabetes mellitus with hypoglycemia without coma ([place]) 08/15/2019    Hyperlipidemia LDL goal <70 08/15/2019   Bilateral carpal tunnel syndrome 03/27/2019   Osteoarthritis of both knees 11/27/2018   CKD (chronic kidney disease), stage II 11/27/2018   GERD (gastroesophageal reflux disease) 06/26/2018   Homonymous hemianopia, left 02/08/2018   Cerebrovascular accident (CVA) due to embolism of cerebral artery ([place])    Diabetes mellitus with coincident hypertension ([place])    Trigger finger, right index finger 10/17/2017   Low back pain 07/29/2017   Carotid artery stenosis 11/02/2016   Left arm weakness    Diastolic dysfunction    Hypertension with heart disease    Acute blood loss anemia    Cerebral infarction due to embolism of right middle cerebral artery ([place])    Perirectal abscess 09/26/2015   Vitamin D deficiency 07/23/2015   Osteopenia 02/16/2013   Ankle fracture 01/19/2013   Anemia 09/27/2011   Type II diabetes mellitus with nephropathy ([place]) 09/24/2011   Mixed hyperlipidemia 09/24/2011   Primary hypertension 09/24/2011    ONSET DATE: 10/28/2021   REFERRING DIAG: I63.9 (ICD-10-CM) - Cerebrovascular accident (CVA), unspecified mechanism ([place])   THERAPY DIAG:  Other abnormalities of gait and mobility  Muscle weakness (generalized)  Unsteadiness on feet  Rationale for Evaluation and Treatment Rehabilitation  SUBJECTIVE:                                                                                                                                                                                             SUBJECTIVE STATEMENT: Pt reports she is very tired today, did not sleep well last night. Having a lot of RLE pain today.   Pt accompanied by: family member sister-in-law Sarah  PERTINENT HISTORY: poorly controlled DM 2, hypertension, CVA, proteinuria, CKD 3, and CAD status post DES to RCA in 03/2020; hx of 3 CVAs   PAIN:  Are you having pain? Yes: NPRS scale: 7/10 Pain location: RLE Pain description: Achy/throbbing  VITALS Vitals:    11/12/21 1457 11/12/21 1512  BP: (!) 153/70 (!) 154/73  Pulse: 96 98      PRECAUTIONS: Fall  WEIGHT BEARING RESTRICTIONS: No  FALLS: Has patient  fallen in last 6 months? Yes. Number of falls 1, fell bringing garbage cans in, RLE started hurting after that fall  LIVING ENVIRONMENT: Lives with: lives with their family; lives with mother and nephew, sister lives close by; rotating siblings Lives in: House/apartment Stairs: Yes: Internal: 12 steps; on right going up and External: 1 steps; none Has following equipment at home: Single point cane, Walker - 2 wheeled, and shower chair  PLOF: Independent with gait and Independent with transfers  PATIENT GOALS: "walking, vision"  OBJECTIVE:   DIAGNOSTIC FINDINGS:  brain MRI 10/24/21: IMPRESSION: 1. Punctate acute infarcts of the left cerebellum and left parietal lobe. No hemorrhage or mass effect. 2. Old posterior right MCA territory infarct and punctate old right cerebellar infarct.  Head angiogram 10/24/21: IMPRESSION: 1. No emergent finding. Accounting for motion artifact, no convincing change from 2021. 2. Bilateral M1 segment stenosis, particularly advanced on the right where there has been prior MCA territory infarct.  COGNITION: Overall cognitive status: Impaired; may have had memory deficits prior to this CVA   SENSATION: Reports N/T in RLE up to thigh but light touch appears intact Proprioception dec in distal LLE  TODAY'S TREATMENT Ther Act  Assessed pt's BP prior to session (see above) and educated pt and Sarah on BP parameters, as pt's systolic BP elevated today. Encouraged pt to check BP at least 2x/day, once in AM and once in PM. Pt and Judson Roch verbalized understanding   Ther Ex  SciFit multi-peaks level 5 for 8 minutes using BUE/BLEs for neural priming for reciprocal movement, dynamic cardiovascular warmup and increased amplitude of stepping. Min verbal cues to sustain attention to task, but pt demonstrated  significant improvement in performance today compared to previous session. RPE of 8/10 following activity   Established and demonstrated HEP (see bolded below) for BLE strength, lateral weight shifting and facilitation of heel strike:  Proper sit <>stand technique without UE support, x10 reps. Initial mod multimodal cues for proper body position and sequencing in which pt able to demonstrate. Noted occasional retropulsion when pt moved to quickly, so min cues to slow down and focus on form.  Standing alt marches w/BUE support on rollator, x10 per side. Min cues to slow down and hold single leg stance for 2-3s for improved BLE strength and single leg stability. Noted creased ROM on RLE > LLE  Standing heel to toe raises to promote proper heelstrike/TO w/gait, x15 reps w/BUE support. Noted increased difficulty w/heel raises and pt reported calf cramps.    GAIT: Gait pattern:  ER of RLE, decreased step length- Right, decreased step length- Left, decreased stride length, decreased hip/knee flexion- Right, decreased hip/knee flexion- Left, decreased ankle dorsiflexion- Right, decreased ankle dorsiflexion- Left, shuffling, and antalgic RLE Distance walked: various clinic distances Assistive device utilized: Walker - 4 wheeled Level of assistance: CGA Comments: also frequently running into obstacles on  L side with rollator and needs max cues for safe brake management. Max cues to sustain attention to task as pt easily distracted.   PATIENT EDUCATION: Education details: Initial HEP Person educated: Patient and sister-in-law Sarah  Education method: Explanation, Demonstration, Tactile cues, Verbal cues, and Handouts Education comprehension: verbalized understanding  HOME EXERCISE PROGRAM: Access Code: [email or web address] URL: [email or web address] Date: 11/12/2021 Prepared by: Mickie Bail Evaan Tidwell  Exercises - Sit to Stand Without Arm Support  - 1 x daily - 7 x weekly - 3 sets - 10 reps - Standing  March with Counter Support  - 1 x daily - 7 x weekly -  3 sets - 10 reps - Heel Toe Raises with Counter Support  - 1 x daily - 7 x weekly - 3 sets - 10 reps   GOALS: Goals reviewed with patient? Yes  SHORT TERM GOALS: Target date: 12/03/2021  Pt will be independent with initial HEP for improved strength, balance, transfers and gait. Baseline: Goal status: INITIAL  2.  Pt will improve gait velocity to at least 1.5 ft/sec for improved gait efficiency and performance at S* level  Baseline: 1.19 ft/sec with rollator and S* (10/26) Goal status: INITIAL  3.  Pt will improve normal TUG to less than or equal to 45 seconds for improved functional mobility and decreased fall risk. Baseline: 59.52 sec with rollator Goal status: INITIAL  4.  Pt will improve Berg score to 28/56 for decreased fall risk  Baseline: 23/56 Goal status: REVISED   LONG TERM GOALS: Target date: 12/31/2021  Pt will be independent with final HEP for improved strength, balance, transfers and gait. Baseline:  Goal status: INITIAL  2.  Pt will improve gait velocity to at least 2.0 ft/sec for improved gait efficiency and performance at S* level  Baseline: 1.19 ft/sec with S* and rollator (10/26) Goal status: INITIAL  3.  Pt will improve normal TUG to less than or equal to 30 seconds for improved functional mobility and decreased fall risk. Baseline: 59.52 sec with rollator Goal status: INITIAL  4.  Pt will improve Berg score to 34/56 for decreased fall risk  Baseline: 23/56 Goal status: REVISED   ASSESSMENT:  CLINICAL IMPRESSION: Emphasis of skilled PT session on establishing initial HEP for BLE strength and facilitation of proper gait kinematics. Pt's BP elevated today, so educated pt and family on proper BP parameters and encouraged pt check BP at home at least 2x/day. Pt less distractible today but required mod cues to slow down, as she tends to rush through exercises. Continue POC.   OBJECTIVE  IMPAIRMENTS: Abnormal gait, cardiopulmonary status limiting activity, decreased activity tolerance, decreased balance, decreased cognition, decreased coordination, decreased endurance, decreased knowledge of condition, decreased knowledge of use of DME, decreased mobility, difficulty walking, decreased ROM, decreased strength, decreased safety awareness, impaired sensation, and impaired vision/preception.   ACTIVITY LIMITATIONS: carrying, lifting, bending, squatting, stairs, transfers, and bed mobility  PARTICIPATION LIMITATIONS: driving and community activity  PERSONAL FACTORS: Age and 1-2 comorbidities:    poorly controlled DM 2, hypertension, CVA, proteinuria, CKD 3, and CAD status post DES to RCA in 03/2020 are also affecting patient's functional outcome.   REHAB POTENTIAL: Good  CLINICAL DECISION MAKING: Stable/uncomplicated  EVALUATION COMPLEXITY: Moderate  PLAN:  PT FREQUENCY: 2x/week  PT DURATION: 8 weeks  PLANNED INTERVENTIONS: Therapeutic exercises, Therapeutic activity, Neuromuscular re-education, Balance training, Gait training, Patient/Family education, Self Care, Joint mobilization, Stair training, Vestibular training, Canalith repositioning, Visual/preceptual remediation/compensation, Orthotic/Fit training, DME instructions, Aquatic Therapy, Dry Needling, Cognitive remediation, Electrical stimulation, Cryotherapy, Moist heat, Taping, Manual therapy, and Re-evaluation  PLAN FOR NEXT SESSION: add to HEP prn, work on safe rollator use with transfers and brake management, work on bed mobility from elevated surface (have sister measure bed height), increasing step length and LE clearance with gait, turns, reaching out of BOS   Marcell Pfeifer E Thedora Rings, PT, DPT 11/12/2021, 3:30 PM

## 2021-11-12 NOTE — Therapy (Signed)
OUTPATIENT OCCUPATIONAL THERAPY NEURO TREATMENT  Patient Name: Sarah [name] MRN: [id number] DOB:[birth year], 76 y.o., female Today's Date: 11/12/2021  PCP: [name], [name], MD  REFERRING PROVIDER: PCP: [name], [name], MD      Past Medical History:  Diagnosis Date   Bilateral carpal tunnel syndrome 03/27/2019   Boils    Glaucoma    Heart murmur    HTN (hypertension)    Hx of adenomatous colonic polyps    Hyperlipidemia    Osteoporosis    Pneumonia    Stroke ([place])    Type II or unspecified type diabetes mellitus without mention of complication, uncontrolled    Past Surgical History:  Procedure Laterality Date   ABDOMINAL HYSTERECTOMY     CARPAL TUNNEL RELEASE     right   COLONOSCOPY  12-10-10   per Dr. Deatra Ina, clear, repeat in 7 yrs    CORONARY STENT INTERVENTION N/A 03/11/2020   Procedure: CORONARY STENT INTERVENTION;  Surgeon: Jettie Booze, MD;  Location: [facility];  Service: Cardiovascular;  Laterality: N/A;   INCISION AND DRAINAGE PERIRECTAL ABSCESS N/A 09/26/2015   Procedure: IRRIGATION AND DEBRIDEMENT PERIRECTAL ABSCESS;  Surgeon: Mickeal Skinner, MD;  Location: [facility];  Service: General;  Laterality: N/A;   INTRAVASCULAR ULTRASOUND/IVUS N/A 03/11/2020   Procedure: Intravascular Ultrasound/IVUS;  Surgeon: Jettie Booze, MD;  Location: [facility];  Service: Cardiovascular;  Laterality: N/A;   KNEE ARTHROSCOPY     right   LEFT HEART CATH AND CORONARY ANGIOGRAPHY N/A 03/11/2020   Procedure: LEFT HEART CATH AND CORONARY ANGIOGRAPHY;  Surgeon: Jettie Booze, MD;  Location: [facility];  Service: Cardiovascular;  Laterality: N/A;   LOOP RECORDER INSERTION N/A 10/19/2016   Procedure: LOOP RECORDER INSERTION;  Surgeon: Thompson Grayer, MD;  Location: [name];  Service: Cardiovascular;  Laterality: N/A;   ORIF ANKLE FRACTURE Right 03/24/2012   Procedure: OPEN REDUCTION INTERNAL FIXATION (ORIF) ANKLE FRACTURE;  Surgeon:  Newt Minion, MD;  Location: [facility];  Service: Orthopedics;  Laterality: Right;  Open Reduction Internal Fixation Right Bimalleolar ankle fracture   POLYPECTOMY     TEE WITHOUT CARDIOVERSION N/A 10/18/2016   Procedure: TRANSESOPHAGEAL ECHOCARDIOGRAM (TEE);  Surgeon: Fay Records, MD;  [facility]: [facility];  Service: Cardiovascular;  Laterality: N/A;   TONSILLECTOMY     Patient Active Problem List   Diagnosis Date Noted   Elevated troponin    Change in mental status 10/24/2021   Long term current use of antithrombotics/antiplatelets 10/02/2021   Heart murmur, systolic 59/16/3846   Dizziness 04/08/2021   Long term (current) use of antithrombotics/antiplatelets 12/16/2020   Chronic constipation 12/16/2020   Colon cancer screening 12/16/2020   History of adenomatous polyp of colon 12/16/2020   Coronary artery disease of native artery of native heart with stable angina pectoris ([place]) 03/04/2020   Diabetes mellitus ([place]) 03/03/2020   Aortic atherosclerosis ([place]) 02/12/2020   Bilateral leg cramps 01/21/2020   Bilateral lower extremity edema 01/21/2020   Anxiety disorder 01/21/2020   Type 2 diabetes mellitus with hyperglycemia, with long-term current use of insulin ([place]) 08/15/2019   Type 2 diabetes mellitus with diabetic polyneuropathy, with long-term current use of insulin ([place]) 08/15/2019   Uncontrolled type 2 diabetes mellitus with hypoglycemia without coma ([place]) 08/15/2019   Hyperlipidemia LDL goal <70 08/15/2019   Bilateral carpal tunnel syndrome 03/27/2019   Osteoarthritis of both knees 11/27/2018   CKD (chronic kidney disease), stage II 11/27/2018   GERD (gastroesophageal  reflux disease) 06/26/2018   Homonymous hemianopia, left 02/08/2018   Cerebrovascular accident (CVA) due to embolism of cerebral artery ([place])    Diabetes mellitus with coincident hypertension ([place])    Trigger finger, right index finger 10/17/2017   Low back pain 07/29/2017   Carotid artery stenosis 11/02/2016    Left arm weakness    Diastolic dysfunction    Hypertension with heart disease    Acute blood loss anemia    Cerebral infarction due to embolism of right middle cerebral artery ([place])    Perirectal abscess 09/26/2015   Vitamin D deficiency 07/23/2015   Osteopenia 02/16/2013   Ankle fracture 01/19/2013   Anemia 09/27/2011   Type II diabetes mellitus with nephropathy ([place]) 09/24/2011   Mixed hyperlipidemia 09/24/2011   Primary hypertension 09/24/2011    ONSET DATE: 10/28/2021 referral date (onset date 10/23/21)   REFERRING DIAG: I63.9 (ICD-10-CM) - Cerebrovascular accident (CVA), unspecified mechanism ([place])   MRI brain: Punctate acute infarcts of the left cerebellum and left parietal lobe without hemorrhage or mass effect. Old posterior right MCA territory infarct and punctate old right cerebellar infarct   THERAPY DIAG:  No diagnosis found.  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 Upon work up, pt with elevated troponin, and MRI showed punctate left cerebellum and left parietal infarcts. PMH includes: DM II, HTN, loop recorder, ORIF right ankle 2014, and CVA x2 (R MCA CVA in 2018 and R parieto-occipital CVA in 2020 w/ residual Lt sided weakness and field cut)  PRECAUTIONS: Other: fall risk, heart monitor, no driving  PAIN:  Are you having pain?  5/10 Rt leg - O.T. not addressing (chronic/pre-existing from arthritis)   FALLS: Has patient fallen in last 6 months? Yes. Number of falls 1  PLOF: Independent and Leisure: puzzles  PATIENT GOALS: work on strength, balance, and vision  OBJECTIVE:   HAND DOMINANCE: Right  ADLs:  Transfers/ambulation related to ADLs: using rollator Eating: mod I w/ some spills Grooming: mod I  UB Dressing: min  assist w/ bra LB Dressing: mod I w/ slip on shoes and elastic pants Toileting: mod I  Bathing: seated sponge bathing w/ set up  Tub Shower transfers: walk in shower w/ shower chair and grab bars but hasn't done since d/c from hospital   IADLs: (however pt did prior to recent stroke)  Shopping: dependent now Light housekeeping: dependent now  Meal Prep: can get a snack, but not cooking Community mobility: relies on family for transportation Medication management: siblings oversees (pillbox, timers on phone)  Financial management: sister performing now (pt was doing)  Handwriting: 90% legible  MOBILITY STATUS:  uses rollator   UPPER EXTREMITY ROM:  BUE AROM WFL's w/ noted weakness end range sh flex deficits LUE (from older strokes) and Lt index DIP flexion (? Mallet finger). Slower to process movement   UPPER EXTREMITY MMT:   RUE MMT grossly 5/5, LUE MMT grossly 4/5  HAND FUNCTION: Grip strength: Right: 30.8 lbs; Left: 18.9 lbs  COORDINATION: 9 Hole Peg test: Right: 46.31 sec; Left: 70.28  sec (difficulty following directions)  Slightly ataxic Lt hand and has difficulty reaching handle of walker (but also some vision and attention deficits to Lt side)   SENSATION: Intact for light touch/localization  EDEMA: none in UE's  COGNITION: Overall cognitive status:  decreased processing speed and short term memory deficits . Difficulty following directions for 9 hole peg test  VISION: Subjective report: vision changes w/ second stroke (Lt homonymous hemianopsia?), however pt reports judging distances and increased bluriness changed w/ this stroke Baseline vision: Wears glasses for reading only Visual history: cataracts  VISION ASSESSMENT: TBA  further in a future session  Patient has difficulty with following activities due to following visual impairments: judging distances, seeing to Lt side for near body and environmental scanning  PERCEPTION: Impaired: Inattention/neglect:  does not attend to left visual field and does not attend to left side of body (from 2nd stroke)     TODAY'S TREATMENT:                                                                          - Neuro re-education completed for duration as noted below including: OT initiated BUE coordination [place] as noted in patient instructions including pick up and placement of items, shuffling and turning cards, item stacking and unstacking, rolling a piece of tissue paper into a ball, rolling golf balls in hand, rolling a pen in between fingers and thumb, picking up and storing items in hand, and then translating stored items to tips of thumb and index finger before placing them individually into a container. OT educated on emphasis on R hand coordination since most recent CVA. OT educated pt and sister-in-law on how to play dominoes for BUE coordination, scanning, judging depth perception, and item recognition.   PATIENT EDUCATION: Education details: Artist Person educated: Patient and SISTER -in-law Education method: Explanation, Demonstration, Verbal cues, and Handouts Education comprehension: verbalized understanding, returned demonstration, verbal cues required, and needs further education  HOME EXERCISE PROGRAM: 11/12/2021 - coordination HEP   GOALS: Goals reviewed with patient? Yes  SHORT TERM GOALS: Target date: 12/06/21  Independent w/ coordination HEP w/ focus on Rt hand (from new stroke) however also Lt hand (modifications prn)  Baseline: Goal status: INITIAL  2.  Pt to improve coordination Rt hand as evidenced by performing 9 hole peg test in 40 sec or less Baseline: 46.31 sec Goal status: INITIAL  3.  Pt to don bra I'ly Baseline: min assist Goal status: INITIAL  4.  Pt to report less spills w/ eating and increase writing legibility to 100% Rt hand Baseline: 90% Goal status: INITIAL  5.  Pt to perform shower transfers and bathing in shower w/ close  supervision Baseline:  Goal status: INITIAL  6.  Pt to perform tabletop scanning attending to Lt side w/ no more than min cues Baseline:  Goal status: INITIAL  LONG TERM GOALS: Target date: 02/04/22  Independent with updated HEP for UE strengthening/endurance and putty HEP  Baseline:  Goal status: INITIAL  2.  Pt to perform light meal prep w/ task modifications and A/E prn with supervision Baseline:  Goal status: INITIAL  3.  Pt to perform light IADLS (washing dishes, laundry, folding, making bed) w/ distant supervision and no LOB Baseline:  Goal status: INITIAL  4. Pt to perform environmental scanning with 85% or greater accuracy (attending to Lt side)  Baseline:  Goal status: INITIAL  5.  Pt to perform  financial management task with sup/no more than min cues Baseline:  Goal status: INITIAL  6.  Pt to improve coordination bilaterally as evidenced by performing 9 hole peg test in 35 sec or less Rt hand and under 60 sec Lt hand Baseline: Rt = 46 sec, Lt = 70 sec Goal status: INITIAL  ASSESSMENT:  CLINICAL IMPRESSION: Pt demonstrates BUE fine motor coordination impairments (L>R), issues with memory, and visuospatial deficits.   PERFORMANCE DEFICITS: in functional skills including ADLs, IADLs, coordination, dexterity, proprioception, sensation, strength, Fine motor control, Gross motor control, mobility, balance, body mechanics, endurance, decreased knowledge of precautions, decreased knowledge of use of DME, vision, and UE functional use, cognitive skills including attention, memory, problem solving, and safety awareness, and psychosocial skills including coping strategies.   IMPAIRMENTS: are limiting patient from ADLs, IADLs, leisure, and social participation.   CO-MORBIDITIES; has co-morbidities such as residual deficits from previous strokes  that affects occupational performance. Patient will benefit from skilled OT to address above impairments and improve overall  function.  PLAN:  OT FREQUENCY: 2x/week  OT DURATION: 12 weeks plus eval  PLANNED INTERVENTIONS: self care/ADL training, therapeutic exercise, therapeutic activity, neuromuscular re-education, manual therapy, passive range of motion, functional mobility training, aquatic therapy, patient/family education, cognitive remediation/compensation, visual/perceptual remediation/compensation, coping strategies training, and DME and/or AE instructions  RECOMMENDED OTHER SERVICES: none at this time  CONSULTED AND AGREED WITH PLAN OF CARE: Patient and family member/caregiver  PLAN FOR NEXT SESSION: further assess vision and visual fields, coordination HEP review RUE primarily, LUE chronically    Dennis Bast, OT 11/12/2021, 1:35 PM

## 2021-11-15 NOTE — Telephone Encounter (Signed)
It was changed to 40 mg upon her sister's request because she was having difficulty swallowing the 80 mg. What is the problem with 40 mg?, is she still having difficulty swallowing tab? Thanks, BJ

## 2021-11-16 NOTE — Telephone Encounter (Signed)
Patient can't tolerate the 2 40 mg tablets daily, wants to know if it's okay to just take 40 mg daily along with the zetia?

## 2021-11-17 ENCOUNTER — Encounter: Payer: Self-pay | Admitting: Adult Health

## 2021-11-17 ENCOUNTER — Ambulatory Visit (INDEPENDENT_AMBULATORY_CARE_PROVIDER_SITE_OTHER): Payer: Medicare Other | Admitting: Adult Health

## 2021-11-17 ENCOUNTER — Ambulatory Visit: Payer: Medicare Other | Admitting: Physical Therapy

## 2021-11-17 ENCOUNTER — Telehealth: Payer: Self-pay | Admitting: Licensed Clinical Social Worker

## 2021-11-17 VITALS — BP 130/70 | HR 101 | Temp 98.4°F | Ht 60.0 in | Wt 115.0 lb

## 2021-11-17 DIAGNOSIS — R1111 Vomiting without nausea: Secondary | ICD-10-CM | POA: Diagnosis not present

## 2021-11-17 NOTE — Therapy (Incomplete)
OUTPATIENT PHYSICAL THERAPY NEURO TREATMENT   Patient Name: Sarah [name] MRN: [id number] DOB:1945-09-26, 76 y.o., female Today's Date: 11/17/2021   PCP: [name], [name], MD REFERRING PROVIDER: Modena Jansky, MD        Past Medical History:  Diagnosis Date   Bilateral carpal tunnel syndrome 03/27/2019   Boils    Glaucoma    Heart murmur    HTN (hypertension)    Hx of adenomatous colonic polyps    Hyperlipidemia    Osteoporosis    Pneumonia    Stroke ([place])    Type II or unspecified type diabetes mellitus without mention of complication, uncontrolled    Past Surgical History:  Procedure Laterality Date   ABDOMINAL HYSTERECTOMY     CARPAL TUNNEL RELEASE     right   COLONOSCOPY  12-10-10   per Dr. Deatra Ina, clear, repeat in 7 yrs    CORONARY STENT INTERVENTION N/A 03/11/2020   Procedure: CORONARY STENT INTERVENTION;  Surgeon: Jettie Booze, MD;  Location: [facility];  Service: Cardiovascular;  Laterality: N/A;   INCISION AND DRAINAGE PERIRECTAL ABSCESS N/A 09/26/2015   Procedure: IRRIGATION AND DEBRIDEMENT PERIRECTAL ABSCESS;  Surgeon: Mickeal Skinner, MD;  Location: [facility];  Service: General;  Laterality: N/A;   INTRAVASCULAR ULTRASOUND/IVUS N/A 03/11/2020   Procedure: Intravascular Ultrasound/IVUS;  Surgeon: Jettie Booze, MD;  Location: [facility];  Service: Cardiovascular;  Laterality: N/A;   KNEE ARTHROSCOPY     right   LEFT HEART CATH AND CORONARY ANGIOGRAPHY N/A 03/11/2020   Procedure: LEFT HEART CATH AND CORONARY ANGIOGRAPHY;  Surgeon: Jettie Booze, MD;  Location: [facility];  Service: Cardiovascular;  Laterality: N/A;   LOOP RECORDER INSERTION N/A 10/19/2016   Procedure: LOOP RECORDER INSERTION;  Surgeon: Thompson Grayer, MD;  Location: [facility];  Service: Cardiovascular;  Laterality: N/A;   ORIF ANKLE FRACTURE Right 03/24/2012   Procedure: OPEN REDUCTION INTERNAL FIXATION (ORIF) ANKLE FRACTURE;  Surgeon:  Newt Minion, MD;  Location: [facility];  Service: Orthopedics;  Laterality: Right;  Open Reduction Internal Fixation Right Bimalleolar ankle fracture   POLYPECTOMY     TEE WITHOUT CARDIOVERSION N/A 10/18/2016   Procedure: TRANSESOPHAGEAL ECHOCARDIOGRAM (TEE);  Surgeon: Fay Records, MD;  Location: [facility];  Service: Cardiovascular;  Laterality: N/A;   TONSILLECTOMY     Patient Active Problem List   Diagnosis Date Noted   Elevated troponin    Change in mental status 10/24/2021   Long term current use of antithrombotics/antiplatelets 10/02/2021   Heart murmur, systolic 12/75/1700   Dizziness 04/08/2021   Long term (current) use of antithrombotics/antiplatelets 12/16/2020   Chronic constipation 12/16/2020   Colon cancer screening 12/16/2020   History of adenomatous polyp of colon 12/16/2020   Coronary artery disease of native artery of native heart with stable angina pectoris ([place]) 03/04/2020   Diabetes mellitus ([place]) 03/03/2020   Aortic atherosclerosis ([place]) 02/12/2020   Bilateral leg cramps 01/21/2020   Bilateral lower extremity edema 01/21/2020   Anxiety disorder 01/21/2020   Type 2 diabetes mellitus with hyperglycemia, with long-term current use of insulin ([place]) 08/15/2019   Type 2 diabetes mellitus with diabetic polyneuropathy, with long-term current use of insulin ([place]) 08/15/2019   Uncontrolled type 2 diabetes mellitus with hypoglycemia without coma ([place]) 08/15/2019   Hyperlipidemia LDL goal <70 08/15/2019   Bilateral carpal tunnel syndrome 03/27/2019   Osteoarthritis of both knees 11/27/2018   CKD (chronic kidney disease), stage II 11/27/2018  GERD (gastroesophageal reflux disease) 06/26/2018   Homonymous hemianopia, left 02/08/2018   Cerebrovascular accident (CVA) due to embolism of cerebral artery ([place])    Diabetes mellitus with coincident hypertension ([place])    Trigger finger, right index finger 10/17/2017   Low back pain 07/29/2017   Carotid artery stenosis 11/02/2016    Left arm weakness    Diastolic dysfunction    Hypertension with heart disease    Acute blood loss anemia    Cerebral infarction due to embolism of right middle cerebral artery ([place])    Perirectal abscess 09/26/2015   Vitamin D deficiency 07/23/2015   Osteopenia 02/16/2013   Ankle fracture 01/19/2013   Anemia 09/27/2011   Type II diabetes mellitus with nephropathy ([place]) 09/24/2011   Mixed hyperlipidemia 09/24/2011   Primary hypertension 09/24/2011    ONSET DATE: 10/28/2021   REFERRING DIAG: I63.9 (ICD-10-CM) - Cerebrovascular accident (CVA), unspecified mechanism ([place])   THERAPY DIAG:  No diagnosis found.  Rationale for Evaluation and Treatment Rehabilitation  SUBJECTIVE:                                                                                                                                                                                             SUBJECTIVE STATEMENT: ***  Pt accompanied by: family member sister-in-law Sarah  PERTINENT HISTORY: poorly controlled DM 2, hypertension, CVA, proteinuria, CKD 3, and CAD status post DES to RCA in 03/2020; hx of 3 CVAs   PAIN:  Are you having pain? Yes: NPRS scale: 7/10 Pain location: RLE Pain description: Achy/throbbing  VITALS There were no vitals filed for this visit.     PRECAUTIONS: Fall  WEIGHT BEARING RESTRICTIONS: No  FALLS: Has patient fallen in last 6 months? Yes. Number of falls 1, fell bringing garbage cans in, RLE started hurting after that fall  LIVING ENVIRONMENT: Lives with: lives with their family; lives with mother and nephew, sister lives close by; rotating siblings Lives in: House/apartment Stairs: Yes: Internal: 12 steps; on right going up and External: 1 steps; none Has following equipment at home: Single point cane, Walker - 2 wheeled, and shower chair  PLOF: Independent with gait and Independent with transfers  PATIENT GOALS: "walking, vision"  OBJECTIVE:   DIAGNOSTIC FINDINGS:   brain MRI 10/24/21: IMPRESSION: 1. Punctate acute infarcts of the left cerebellum and left parietal lobe. No hemorrhage or mass effect. 2. Old posterior right MCA territory infarct and punctate old right cerebellar infarct.  Head angiogram 10/24/21: IMPRESSION: 1. No emergent finding. Accounting for motion artifact, no convincing change from 2021. 2. Bilateral M1 segment stenosis, particularly advanced on the right where there has  been prior MCA territory infarct.  COGNITION: Overall cognitive status: Impaired; may have had memory deficits prior to this CVA   SENSATION: Reports N/T in RLE up to thigh but light touch appears intact Proprioception dec in distal LLE  TODAY'S TREATMENT Ther Act  ***  Ther Ex  ***   GAIT: Gait pattern:  ER of RLE, decreased step length- Right, decreased step length- Left, decreased stride length, decreased hip/knee flexion- Right, decreased hip/knee flexion- Left, decreased ankle dorsiflexion- Right, decreased ankle dorsiflexion- Left, shuffling, and antalgic RLE Distance walked: various clinic distances Assistive device utilized: Walker - 4 wheeled Level of assistance: CGA Comments: also frequently running into obstacles on  L side with rollator and needs max cues for safe brake management. Max cues to sustain attention to task as pt easily distracted.   PATIENT EDUCATION: Education details: Initial HEP*** Person educated: Patient and sister-in-law Sarah  Education method: Explanation, Demonstration, Tactile cues, Verbal cues, and Handouts Education comprehension: verbalized understanding  HOME EXERCISE PROGRAM: Access Code: [email or web address] URL: [email or web address] Date: 11/12/2021 Prepared by: Mickie Bail Plaster  Exercises - Sit to Stand Without Arm Support  - 1 x daily - 7 x weekly - 3 sets - 10 reps - Standing March with Counter Support  - 1 x daily - 7 x weekly - 3 sets - 10 reps - Heel Toe Raises with Counter Support  - 1 x  daily - 7 x weekly - 3 sets - 10 reps   GOALS: Goals reviewed with patient? Yes  SHORT TERM GOALS: Target date: 12/03/2021  Pt will be independent with initial HEP for improved strength, balance, transfers and gait. Baseline: Goal status: INITIAL  2.  Pt will improve gait velocity to at least 1.5 ft/sec for improved gait efficiency and performance at S* level  Baseline: 1.19 ft/sec with rollator and S* (10/26) Goal status: INITIAL  3.  Pt will improve normal TUG to less than or equal to 45 seconds for improved functional mobility and decreased fall risk. Baseline: 59.52 sec with rollator Goal status: INITIAL  4.  Pt will improve Berg score to 28/56 for decreased fall risk  Baseline: 23/56 Goal status: REVISED   LONG TERM GOALS: Target date: 12/31/2021  Pt will be independent with final HEP for improved strength, balance, transfers and gait. Baseline:  Goal status: INITIAL  2.  Pt will improve gait velocity to at least 2.0 ft/sec for improved gait efficiency and performance at S* level  Baseline: 1.19 ft/sec with S* and rollator (10/26) Goal status: INITIAL  3.  Pt will improve normal TUG to less than or equal to 30 seconds for improved functional mobility and decreased fall risk. Baseline: 59.52 sec with rollator Goal status: INITIAL  4.  Pt will improve Berg score to 34/56 for decreased fall risk  Baseline: 23/56 Goal status: REVISED   ASSESSMENT:  CLINICAL IMPRESSION: Emphasis of skilled PT session on*** Continue POC.   OBJECTIVE IMPAIRMENTS: Abnormal gait, cardiopulmonary status limiting activity, decreased activity tolerance, decreased balance, decreased cognition, decreased coordination, decreased endurance, decreased knowledge of condition, decreased knowledge of use of DME, decreased mobility, difficulty walking, decreased ROM, decreased strength, decreased safety awareness, impaired sensation, and impaired vision/preception.   ACTIVITY LIMITATIONS:  carrying, lifting, bending, squatting, stairs, transfers, and bed mobility  PARTICIPATION LIMITATIONS: driving and community activity  PERSONAL FACTORS: Age and 1-2 comorbidities:    poorly controlled DM 2, hypertension, CVA, proteinuria, CKD 3, and CAD status post DES to RCA in 03/2020 are also affecting patient's functional  outcome.   REHAB POTENTIAL: Good  CLINICAL DECISION MAKING: Stable/uncomplicated  EVALUATION COMPLEXITY: Moderate  PLAN:  PT FREQUENCY: 2x/week  PT DURATION: 8 weeks  PLANNED INTERVENTIONS: Therapeutic exercises, Therapeutic activity, Neuromuscular re-education, Balance training, Gait training, Patient/Family education, Self Care, Joint mobilization, Stair training, Vestibular training, Canalith repositioning, Visual/preceptual remediation/compensation, Orthotic/Fit training, DME instructions, Aquatic Therapy, Dry Needling, Cognitive remediation, Electrical stimulation, Cryotherapy, Moist heat, Taping, Manual therapy, and Re-evaluation  PLAN FOR NEXT SESSION: add to HEP prn, work on safe rollator use with transfers and brake management, work on bed mobility from elevated surface (have sister measure bed height), increasing step length and LE clearance with gait, turns, reaching out of BOS***   Excell Seltzer, PT, DPT, CSRS 11/17/2021, 9:22 AM

## 2021-11-17 NOTE — Patient Instructions (Signed)
I think you have a stomach bug.   Take the zofran you have at home and try doing a bland diet over the next 72 hours   Let me know if you continue to have any vomiting

## 2021-11-17 NOTE — Telephone Encounter (Signed)
Because her CV hx I will prefer atorvastatin 80 mg but if she cannot tolerate it, she can continue atorvastatin 40 mg daily with Zetia 10 mg daily + low-fat diet. Thanks, BJ

## 2021-11-17 NOTE — Patient Outreach (Signed)
  Care Coordination   Follow Up Visit Note   11/17/2021 Name: Sarah [name] MRN: [id number] DOB: Mar 16, 1945  Sarah [name] is a 76 y.o. year old female who sees [name], Malka So, MD for primary care. I spoke with  Sarah [name]'s sister, Sarah [name], by phone today.  What matters to the patients health and wellness today?  Access GSo    Goals Addressed             This Visit's Progress    Obtain Safe and Stable Transportation   On track    Care Coordination Interventions: Solution-Focused Strategies employed:  Active listening / Reflection utilized  Emotional Support Provided Caregiver stress acknowledged  Verbalization of feelings encouraged  LCSW received an incoming call from [place] Patient completed PCP appt today due to vomiting. She has an appetite; however, can't keep much down resulting in weight loss. Pt was placed on a bland diet Pt has not obtained eligibility letter from [place] will follow up           SDOH assessments and interventions completed:  No     Care Coordination Interventions Activated:  Yes  Care Coordination Interventions:  Yes, provided   Follow up plan: Follow up call scheduled for 11/30/21    Encounter Outcome:  Pt. Visit Completed   Christa See, MSW, [email or web address] Phone [phone] 4:02 PM

## 2021-11-17 NOTE — Progress Notes (Signed)
Subjective:    Patient ID: Sarah [name], female    DOB: 1945/12/03, 76 y.o.   MRN: [id number]  HPI 76 year old female who  has a past medical history of Bilateral carpal tunnel syndrome (03/27/2019), Boils, Glaucoma, Heart murmur, HTN (hypertension), adenomatous colonic polyps, Hyperlipidemia, Osteoporosis, Pneumonia, Stroke ([place]), and Type II or unspecified type diabetes mellitus without mention of complication, uncontrolled.  She presents to the office today for " excessive vomiting" she reports that she started for started vomiting yesterday evening and had 4 episodes, her last being this morning.  She has decreased appetite and has not been able to keep anything down.  She denies nausea, diarrhea, constipation, fevers, chills, blood in vomit or stool, or abdominal pain.  She did have a stroke about 3 weeks ago, medications were adjusted but nothing new was added.  Has been keeping an eye on her blood sugars with readings mostly in the 100s to 120s.  He has Zofran on her medication list but has not been using it.  A tad bit better today than she did yesterday   Review of Systems See HPI   Past Medical History:  Diagnosis Date   Bilateral carpal tunnel syndrome 03/27/2019   Boils    Glaucoma    Heart murmur    HTN (hypertension)    Hx of adenomatous colonic polyps    Hyperlipidemia    Osteoporosis    Pneumonia    Stroke ([place])    Type II or unspecified type diabetes mellitus without mention of complication, uncontrolled     Social History   Socioeconomic History   Marital status: Single    Spouse name: Not on file   Number of children: Not on file   Years of education: Not on file   Highest education level: Not on file  Occupational History   Not on file  Tobacco Use   Smoking status: Former    Types: Cigarettes    Quit date: 10/15/2016    Years since quitting: 5.0   Smokeless tobacco: Never   Tobacco comments:    smokes occ.   Vaping Use   Vaping Use: Never  used  Substance and Sexual Activity   Alcohol use: Yes    Alcohol/week: 0.0 standard drinks of alcohol    Comment: occ   Drug use: No   Sexual activity: Not on file  Other Topics Concern   Not on file  Social History Narrative   Not on file   Social Determinants of Health   Financial Resource Strain: Low Risk  (04/29/2021)   Overall Financial Resource Strain (CARDIA)    Difficulty of Paying Living Expenses: Not hard at all  Food Insecurity: No Food Insecurity (10/30/2021)   Hunger Vital Sign    Worried About Running Out of Food in the Last Year: Never true    Ran Out of Food in the Last Year: Never true  Transportation Needs: Unmet Transportation Needs (10/30/2021)   PRAPARE - Hydrologist (Medical): Yes    Lack of Transportation (Non-Medical): No  Physical Activity: Insufficiently Active (04/29/2021)   Exercise Vital Sign    Days of Exercise per Week: 1 day    Minutes of Exercise per Session: 30 min  Stress: No Stress Concern Present (04/29/2021)   [place]    Feeling of Stress : Not at all  Social Connections: Socially Isolated (04/29/2021)   Social Connection and Isolation  Panel [NHANES]    Frequency of Communication with Friends and Family: More than three times a week    Frequency of Social Gatherings with Friends and Family: More than three times a week    Attends Religious Services: Never    Marine scientist or Organizations: No    Attends Archivist Meetings: Never    Marital Status: Never married  Intimate Partner Violence: Not At Risk (10/24/2021)   Humiliation, Afraid, Rape, and Kick questionnaire    Fear of Current or Ex-Partner: No    Emotionally Abused: No    Physically Abused: No    Sexually Abused: No    Past Surgical History:  Procedure Laterality Date   ABDOMINAL HYSTERECTOMY     CARPAL TUNNEL RELEASE     right   COLONOSCOPY  12-10-10    per Dr. Deatra Ina, clear, repeat in 7 yrs    CORONARY STENT INTERVENTION N/A 03/11/2020   Procedure: CORONARY STENT INTERVENTION;  Surgeon: Jettie Booze, MD;  Location: [facility];  Service: Cardiovascular;  Laterality: N/A;   INCISION AND DRAINAGE PERIRECTAL ABSCESS N/A 09/26/2015   Procedure: IRRIGATION AND DEBRIDEMENT PERIRECTAL ABSCESS;  Surgeon: Mickeal Skinner, MD;  Location: [facility];  Service: General;  Laterality: N/A;   INTRAVASCULAR ULTRASOUND/IVUS N/A 03/11/2020   Procedure: Intravascular Ultrasound/IVUS;  Surgeon: Jettie Booze, MD;  Location: [facility];  Service: Cardiovascular;  Laterality: N/A;   KNEE ARTHROSCOPY     right   LEFT HEART CATH AND CORONARY ANGIOGRAPHY N/A 03/11/2020   Procedure: LEFT HEART CATH AND CORONARY ANGIOGRAPHY;  Surgeon: Jettie Booze, MD;  Location: [facility];  Service: Cardiovascular;  Laterality: N/A;   LOOP RECORDER INSERTION N/A 10/19/2016   Procedure: LOOP RECORDER INSERTION;  Surgeon: Thompson Grayer, MD;  Location: [facility];  Service: Cardiovascular;  Laterality: N/A;   ORIF ANKLE FRACTURE Right 03/24/2012   Procedure: OPEN REDUCTION INTERNAL FIXATION (ORIF) ANKLE FRACTURE;  Surgeon: Newt Minion, MD;  Location: [facility];  Service: Orthopedics;  Laterality: Right;  Open Reduction Internal Fixation Right Bimalleolar ankle fracture   POLYPECTOMY     TEE WITHOUT CARDIOVERSION N/A 10/18/2016   Procedure: TRANSESOPHAGEAL ECHOCARDIOGRAM (TEE);  Surgeon: Fay Records, MD;  [facility];  Service: Cardiovascular;  Laterality: N/A;   TONSILLECTOMY      Family History  Problem Relation Age of Onset   Diabetes Mother    Hypertension Mother    Stroke Father    Stroke Maternal Uncle    Stroke Paternal Uncle    Colon cancer Neg Hx    Esophageal cancer Neg Hx    Rectal cancer Neg Hx    Stomach cancer Neg Hx    Inflammatory bowel disease Neg Hx    Liver disease Neg Hx    Pancreatic cancer Neg Hx      No Known Allergies  Current Outpatient Medications on File Prior to Visit  Medication Sig Dispense Refill   amLODipine (NORVASC) 5 MG tablet Take 1 tablet (5 mg total) by mouth daily. 90 tablet 3   atorvastatin (LIPITOR) 40 MG tablet Take 2 tablets (80 mg total) by mouth daily. 180 tablet 3   clopidogrel (PLAVIX) 75 MG tablet TAKE 1 TABLET(75 MG) BY MOUTH DAILY (Patient taking differently: Take 75 mg by mouth daily.) 90 tablet 1   dapagliflozin propanediol (FARXIGA) 10 MG TABS tablet Take 1 tablet (10 mg total) by mouth daily. 90 tablet 3  ezetimibe (ZETIA) 10 MG tablet Take 1 tablet (10 mg total) by mouth daily. 90 tablet 3   furosemide (LASIX) 20 MG tablet TAKE 1 TABLET(20 MG) BY MOUTH DAILY (Patient taking differently: 40 mg.) 30 tablet 2   gabapentin (NEURONTIN) 100 MG capsule Take 1-2 capsules (100-200 mg total) by mouth at bedtime. 60 capsule 0   hydrALAZINE (APRESOLINE) 10 MG tablet Take 1 tablet (10 mg total) by mouth 3 (three) times daily. 240 tablet 3   insulin glargine, 1 Unit Dial, (TOUJEO SOLOSTAR) 300 UNIT/ML Solostar Pen Inject 16 Units into the skin daily in the afternoon. 15 mL 3   insulin lispro (HUMALOG KWIKPEN) 100 UNIT/ML KwikPen Max daily 25 units 30 mL 3   Insulin Pen Needle 32G X 4 MM MISC 1 Device by Does not apply route in the morning, at noon, in the evening, and at bedtime. 400 each 3   latanoprost (XALATAN) 0.005 % ophthalmic solution Place 1 drop into both eyes at bedtime.     liraglutide (VICTOZA) 18 MG/3ML SOPN Inject into the skin daily.     losartan (COZAAR) 50 MG tablet TAKE 1 TABLET(50 MG) BY MOUTH DAILY (Patient taking differently: Take 100 mg by mouth daily.) 90 tablet 1   Magnesium 400 MG TABS Take 400 mg by mouth daily.     Na Sulfate-K Sulfate-Mg Sulf (SUPREP BOWEL PREP KIT) 17.5-3.13-1.6 GM/177ML SOLN Take 1 kit by mouth as directed. 324 mL 0   nitroGLYCERIN (NITROSTAT) 0.4 MG SL tablet Place 1 tablet (0.4 mg total) under the tongue every 5 (five)  minutes as needed for chest pain. one tab every 5 minutes up to 3 tablets total over 15 minutes. 25 tablet 3   ondansetron (ZOFRAN) 4 MG tablet Take 1 tablet (4 mg total) by mouth every 8 (eight) hours as needed for nausea or vomiting. 20 tablet 0   sertraline (ZOLOFT) 50 MG tablet Take 1 tablet (50 mg total) by mouth daily. 90 tablet 1   traMADol (ULTRAM) 50 MG tablet Take 1 tablet (50 mg total) by mouth daily as needed. (Patient taking differently: Take 50 mg by mouth daily as needed for moderate pain or severe pain.) 30 tablet 0   No current facility-administered medications on file prior to visit.    BP 130/70   Pulse (!) 101   Temp 98.4 F (36.9 C) (Oral)   Ht 5' (1.524 m)   Wt 115 lb (52.2 kg)   SpO2 97%   BMI 22.46 kg/m       Objective:   Physical Exam Vitals and nursing note reviewed.  Constitutional:      Appearance: Normal appearance.  Cardiovascular:     Rate and Rhythm: Normal rate and regular rhythm.     Pulses: Normal pulses.     Heart sounds: Normal heart sounds.  Pulmonary:     Effort: Pulmonary effort is normal.     Breath sounds: Normal breath sounds.  Abdominal:     General: Abdomen is flat. Bowel sounds are normal. There is no distension.     Palpations: Abdomen is soft.     Tenderness: There is no abdominal tenderness. There is no right CVA tenderness or left CVA tenderness.  Musculoskeletal:        General: Normal range of motion.  Skin:    General: Skin is warm and dry.  Neurological:     General: No focal deficit present.     Mental Status: She is alert and oriented to person, place,  and time.  Psychiatric:        Mood and Affect: Mood normal.        Behavior: Behavior normal.        Thought Content: Thought content normal.       Assessment & Plan:  1. Vomiting without nausea, unspecified vomiting type -Advised taking Zofran that she has at home.  Start with a bland diet for the next 72 hours and's from there.  Follow-up in 1 to 2 days if  not improving or sooner if symptoms worsen  Dorothyann Peng, NP

## 2021-11-17 NOTE — Patient Instructions (Signed)
Visit Information  Thank you for taking time to visit with me today. Please don't hesitate to contact me if I can be of assistance to you.   Following are the goals we discussed today:   Goals Addressed             This Visit's Progress    Obtain Safe and Stable Transportation   On track    Care Coordination Interventions: Solution-Focused Strategies employed:  Active listening / Reflection utilized  Emotional Support Provided Caregiver stress acknowledged  Verbalization of feelings encouraged  LCSW received an incoming call from [place] Patient completed PCP appt today due to vomiting. She has an appetite; however, can't keep much down resulting in weight loss. Pt was placed on a bland diet Pt has not obtained eligibility letter from [place] will follow up           [place] next appointment is by telephone on 11/20 at 11 AM  Please call the care guide team at [phone] if you need to cancel or reschedule your appointment.   If you are experiencing a Mental Health or [address] or need someone to talk to, please call the Suicide and Crisis Lifeline: 988 call 911   Patient verbalizes understanding of instructions and care plan provided today and agrees to view in [place]. Active MyChart status and patient understanding of how to access instructions and care plan via MyChart confirmed with patient.     Christa See, MSW, [email or web address] Phone [phone] 4:02 PM

## 2021-11-18 ENCOUNTER — Encounter: Payer: Self-pay | Admitting: Licensed Clinical Social Worker

## 2021-11-18 NOTE — Telephone Encounter (Signed)
I spoke with patient. She is aware of message below.

## 2021-11-19 ENCOUNTER — Encounter (HOSPITAL_COMMUNITY): Payer: Self-pay | Admitting: Emergency Medicine

## 2021-11-19 ENCOUNTER — Emergency Department (HOSPITAL_COMMUNITY): Payer: Medicare Other

## 2021-11-19 ENCOUNTER — Other Ambulatory Visit: Payer: Self-pay

## 2021-11-19 ENCOUNTER — Ambulatory Visit: Payer: Medicare Other | Admitting: Physical Therapy

## 2021-11-19 ENCOUNTER — Emergency Department (HOSPITAL_COMMUNITY)
Admission: EM | Admit: 2021-11-19 | Discharge: 2021-11-19 | Disposition: A | Discharge status: Home / Self Care | Payer: Medicare Other | Attending: Emergency Medicine | Admitting: Emergency Medicine

## 2021-11-19 DIAGNOSIS — K828 Other specified diseases of gallbladder: Secondary | ICD-10-CM | POA: Diagnosis not present

## 2021-11-19 DIAGNOSIS — R1110 Vomiting, unspecified: Secondary | ICD-10-CM | POA: Diagnosis not present

## 2021-11-19 DIAGNOSIS — R011 Cardiac murmur, unspecified: Secondary | ICD-10-CM | POA: Diagnosis not present

## 2021-11-19 DIAGNOSIS — R112 Nausea with vomiting, unspecified: Secondary | ICD-10-CM | POA: Diagnosis not present

## 2021-11-19 DIAGNOSIS — R531 Weakness: Secondary | ICD-10-CM | POA: Diagnosis not present

## 2021-11-19 DIAGNOSIS — Z794 Long term (current) use of insulin: Secondary | ICD-10-CM | POA: Insufficient documentation

## 2021-11-19 DIAGNOSIS — R630 Anorexia: Secondary | ICD-10-CM | POA: Insufficient documentation

## 2021-11-19 DIAGNOSIS — E11649 Type 2 diabetes mellitus with hypoglycemia without coma: Secondary | ICD-10-CM | POA: Insufficient documentation

## 2021-11-19 DIAGNOSIS — Z7901 Long term (current) use of anticoagulants: Secondary | ICD-10-CM | POA: Diagnosis not present

## 2021-11-19 DIAGNOSIS — N3289 Other specified disorders of bladder: Secondary | ICD-10-CM | POA: Diagnosis not present

## 2021-11-19 LAB — BASIC METABOLIC PANEL
Anion gap: 9 (ref 5–15)
BUN: 22 mg/dL (ref 8–23)
CO2: 24 mmol/L (ref 22–32)
Calcium: 9.7 mg/dL (ref 8.9–10.3)
Chloride: 104 mmol/L (ref 98–111)
Creatinine, Ser: 1.36 mg/dL — ABNORMAL HIGH (ref 0.44–1.00)
GFR, Estimated: 41 mL/min — ABNORMAL LOW (ref >60)
Glucose, Bld: 101 mg/dL — ABNORMAL HIGH (ref 70–99)
Potassium: 4.3 mmol/L (ref 3.5–5.1)
Sodium: 137 mmol/L (ref 135–145)

## 2021-11-19 LAB — CBC WITH DIFFERENTIAL/PLATELET
Abs Immature Granulocytes: 0.04 10*3/uL (ref 0.00–0.07)
Basophils Absolute: 0.1 10*3/uL (ref 0.0–0.1)
Basophils Relative: 1 %
Eosinophils Absolute: 0 10*3/uL (ref 0.0–0.5)
Eosinophils Relative: 0 %
HCT: 36.8 % (ref 36.0–46.0)
Hemoglobin: 11.7 g/dL — ABNORMAL LOW (ref 12.0–15.0)
Immature Granulocytes: 0 %
Lymphocytes Relative: 16 %
Lymphs Abs: 1.7 10*3/uL (ref 0.7–4.0)
MCH: 31 pg (ref 26.0–34.0)
MCHC: 31.8 g/dL (ref 30.0–36.0)
MCV: 97.4 fL (ref 80.0–100.0)
Monocytes Absolute: 0.4 10*3/uL (ref 0.1–1.0)
Monocytes Relative: 4 %
Neutro Abs: 8.1 10*3/uL — ABNORMAL HIGH (ref 1.7–7.7)
Neutrophils Relative %: 79 %
Platelets: 376 10*3/uL (ref 150–400)
RBC: 3.78 MIL/uL — ABNORMAL LOW (ref 3.87–5.11)
RDW: 13.3 % (ref 11.5–15.5)
WBC: 10.2 10*3/uL (ref 4.0–10.5)
nRBC: 0 % (ref 0.0–0.2)

## 2021-11-19 LAB — URINALYSIS, ROUTINE W REFLEX MICROSCOPIC
Bilirubin Urine: NEGATIVE
Glucose, UA: >=500 mg/dL — AB
Ketones, ur: 5 mg/dL — AB
Leukocytes,Ua: NEGATIVE
Nitrite: NEGATIVE
Protein, ur: >=300 mg/dL — AB
Specific Gravity, Urine: 1.017 (ref 1.005–1.030)
pH: 5 (ref 5.0–8.0)

## 2021-11-19 LAB — CBG MONITORING, ED
Glucose-Capillary: 67 mg/dL — ABNORMAL LOW (ref 70–99)
Glucose-Capillary: 77 mg/dL (ref 70–99)
Glucose-Capillary: 90 mg/dL (ref 70–99)

## 2021-11-19 MED ORDER — SODIUM CHLORIDE 0.9 % IV BOLUS
500.0000 mL | Freq: Once | INTRAVENOUS | Status: AC
  Administered 2021-11-19: 500 mL via INTRAVENOUS

## 2021-11-19 MED ORDER — IOHEXOL 300 MG/ML  SOLN
80.0000 mL | Freq: Once | INTRAMUSCULAR | Status: AC | PRN
  Administered 2021-11-19: 80 mL via INTRAVENOUS

## 2021-11-19 NOTE — ED Notes (Signed)
Patient ate her sandwich and drink her apple juice. Denies N/V.

## 2021-11-19 NOTE — ED Provider Triage Note (Signed)
Emergency Medicine Provider Triage Evaluation Note  Sarah [name] , a 76 y.o. female  was evaluated in triage.  Pt complains of increased weakness, nausea, vomiting, decreased appetite, lightheadedness.  She had her 3rd CVA on 10/23/21 but has been doing well since being discharged from hospital until the last 2 days.  She has still been getting all medications including diabetes meds.  Family states they have had difficulty keeping her blood sugar up, lowest reading 50 yesterday, highest 70.  Denies fever, chills, shortness of breath, chest pain.   Review of Systems  Positive: See above Negative: See above  Physical Exam  BP 138/65 (BP Location: Left Arm)   Pulse 93   Temp 98.5 F (36.9 C) (Oral)   Resp 16   SpO2 97%  Gen:   Awake, no distress, A&Ox3   Resp:  Normal effort, lungs clear MSK:   Moves extremities without difficulty  Other:  Murmur appreciated on exam, abdomen is soft, non tender  Medical Decision Making  Medically screening exam initiated at 1:10 PM.  Appropriate orders placed.  KAYSEE HERGERT was informed that the remainder of the evaluation will be completed by another provider, this initial triage assessment does not replace that evaluation, and the importance of remaining in the ED until their evaluation is complete.     Theressa Stamps R, [place] 11/19/21 1317

## 2021-11-19 NOTE — ED Triage Notes (Signed)
Pt has been having decreased appetite x 1 week. Pt now has been having issues w/ low blood suger. Pt CBG in triage 67. Juice and cracker provided.

## 2021-11-19 NOTE — ED Provider Notes (Signed)
[facility] DEPT Provider Note   CSN: [id number] Arrival date & time: 11/19/21  1200     History  Chief Complaint  Patient presents with   Weakness    Sarah [name] is a 76 y.o. female presents to the ER with complaints of vomiting, decreased appetite, and generalized weakness for the past week.  Patient has also been having problems with low blood sugar, lowest has been a 50 at home.  She was seen by family med earlier this week and told to take Zofran and follow a bland diet.  Has not had any episodes of vomiting today, but does not feel up to eating.  She is still taking all medications as prescribed for her diabetes.  Denies fever, chills, diarrhea, chest pain, shortness of breath, abdominal pain, urinary changes, hematochezia, melena.      Home Medications Prior to Admission medications   Medication Sig Start Date End Date Taking? Authorizing Provider  amLODipine (NORVASC) 5 MG tablet Take 1 tablet (5 mg total) by mouth daily. 10/07/21   Imogene Burn, PA-C  atorvastatin (LIPITOR) 40 MG tablet Take 2 tablets (80 mg total) by mouth daily. 11/03/21   [name], [name], MD  clopidogrel (PLAVIX) 75 MG tablet TAKE 1 TABLET(75 MG) BY MOUTH DAILY Patient taking differently: Take 75 mg by mouth daily. 12/15/20   [name], [name], MD  dapagliflozin propanediol (FARXIGA) 10 MG TABS tablet Take 1 tablet (10 mg total) by mouth daily. 11/03/21   Shamleffer, Melanie Crazier, MD  ezetimibe (ZETIA) 10 MG tablet Take 1 tablet (10 mg total) by mouth daily. 11/02/21   [name], [name], MD  furosemide (LASIX) 20 MG tablet TAKE 1 TABLET(20 MG) BY MOUTH DAILY Patient taking differently: 40 mg. 05/30/20   [name], [name], MD  gabapentin (NEURONTIN) 100 MG capsule Take 1-2 capsules (100-200 mg total) by mouth at bedtime. 10/20/21 11/19/21  [name], [name], MD  hydrALAZINE (APRESOLINE) 10 MG tablet Take 1 tablet (10 mg total) by mouth 3 (three) times daily. 10/07/21    Imogene Burn, PA-C  insulin glargine, 1 Unit Dial, (TOUJEO SOLOSTAR) 300 UNIT/ML Solostar Pen Inject 16 Units into the skin daily in the afternoon. 11/03/21   Shamleffer, Melanie Crazier, MD  insulin lispro (HUMALOG KWIKPEN) 100 UNIT/ML KwikPen Max daily 25 units 11/03/21   Shamleffer, Melanie Crazier, MD  Insulin Pen Needle 32G X 4 MM MISC 1 Device by Does not apply route in the morning, at noon, in the evening, and at bedtime. 11/03/21   Shamleffer, Melanie Crazier, MD  latanoprost (XALATAN) 0.005 % ophthalmic solution Place 1 drop into both eyes at bedtime.    [provider]  liraglutide (VICTOZA) 18 MG/3ML SOPN Inject into the skin daily.    [provider]  losartan (COZAAR) 50 MG tablet TAKE 1 TABLET(50 MG) BY MOUTH DAILY Patient taking differently: Take 100 mg by mouth daily. 01/08/20   [name], [name], MD  Magnesium 400 MG TABS Take 400 mg by mouth daily.    [provider]  Na Sulfate-K Sulfate-Mg Sulf (SUPREP BOWEL PREP KIT) 17.5-3.13-1.6 GM/177ML SOLN Take 1 kit by mouth as directed. 10/02/21   Mansouraty, Telford Nab., MD  nitroGLYCERIN (NITROSTAT) 0.4 MG SL tablet Place 1 tablet (0.4 mg total) under the tongue every 5 (five) minutes as needed for chest pain. one tab every 5 minutes up to 3 tablets total over 15 minutes. 02/29/20   Werner Lean, MD  ondansetron (ZOFRAN) 4 MG  tablet Take 1 tablet (4 mg total) by mouth every 8 (eight) hours as needed for nausea or vomiting. 10/01/21   Mansouraty, Telford Nab., MD  sertraline (ZOLOFT) 50 MG tablet Take 1 tablet (50 mg total) by mouth daily. 11/17/20   [name], [name], MD  traMADol (ULTRAM) 50 MG tablet Take 1 tablet (50 mg total) by mouth daily as needed. Patient taking differently: Take 50 mg by mouth daily as needed for moderate pain or severe pain. 10/20/21 11/19/21  [name], [name], MD      Allergies    Patient has no known allergies.    Review of Systems   Review of Systems  Constitutional:   Negative for chills and fever.  Respiratory:  Negative for shortness of breath.   Cardiovascular:  Negative for chest pain.  Gastrointestinal:  Positive for nausea and vomiting. Negative for abdominal pain, blood in stool and diarrhea.  Genitourinary:  Negative for dysuria.  Neurological:  Positive for weakness. Negative for dizziness, syncope and light-headedness.    Physical Exam Updated Vital Signs BP (!) 146/89   Pulse 89   Temp 98 F (36.7 C)   Resp 16   SpO2 99%  Physical Exam Vitals and nursing note reviewed.  Constitutional:      General: She is not in acute distress.    Appearance: Normal appearance. She is not ill-appearing or toxic-appearing.  HENT:     Mouth/Throat:     Mouth: Mucous membranes are moist.     Pharynx: Oropharynx is clear.  Cardiovascular:     Rate and Rhythm: Normal rate and regular rhythm.     Pulses: Normal pulses.     Heart sounds: Murmur heard.  Pulmonary:     Effort: Pulmonary effort is normal. No respiratory distress.     Breath sounds: Normal breath sounds and air entry. No decreased breath sounds.  Abdominal:     General: Abdomen is flat. Bowel sounds are normal. There is no distension.     Palpations: Abdomen is soft.     Tenderness: There is no abdominal tenderness.  Musculoskeletal:     Right lower leg: No edema.     Left lower leg: No edema.  Skin:    General: Skin is warm and dry.     Capillary Refill: Capillary refill takes less than 2 seconds.  Neurological:     General: No focal deficit present.     Mental Status: She is alert and oriented to person, place, and time. Mental status is at baseline.     Sensory: Sensation is intact.     Motor: Weakness present.     Comments: Generalized weakness of extremities, no focal deficit, sensation is intact.  Patient feels weak when she attempts to ambulate.  Psychiatric:        Mood and Affect: Mood and affect normal.        Behavior: Behavior normal. Behavior is cooperative.      ED Results / Procedures / Treatments   Labs (all labs ordered are listed, but only abnormal results are displayed) Labs Reviewed  BASIC METABOLIC PANEL - Abnormal; Notable for the following components:      Result Value   Glucose, Bld 101 (*)    Creatinine, Ser 1.36 (*)    GFR, Estimated 41 (*)    All other components within normal limits  CBC WITH DIFFERENTIAL/PLATELET - Abnormal; Notable for the following components:   RBC 3.78 (*)    Hemoglobin 11.7 (*)  Neutro Abs 8.1 (*)    All other components within normal limits  URINALYSIS, ROUTINE W REFLEX MICROSCOPIC - Abnormal; Notable for the following components:   APPearance HAZY (*)    Glucose, UA >=500 (*)    Hgb urine dipstick SMALL (*)    Ketones, ur 5 (*)    Protein, ur >=300 (*)    Bacteria, UA RARE (*)    All other components within normal limits  CBG MONITORING, ED - Abnormal; Notable for the following components:   Glucose-Capillary 67 (*)    All other components within normal limits  CBG MONITORING, ED  CBG MONITORING, ED    EKG EKG Interpretation  Date/Time:  Thursday November 19 2021 13:32:47 EST Ventricular Rate:  91 PR Interval:  154 QRS Duration: 82 QT Interval:  353 QTC Calculation: 435 R Axis:   38 Text Interpretation: Sinus rhythm Nonspecific T abnormalities, lateral leads Confirmed by Garnette Gunner [id number]) on 11/19/2021 4:22:54 PM  Radiology CT Abdomen Pelvis W Contrast  Result Date: 11/19/2021 CLINICAL DATA:  Nausea and vomiting. Decreased appetite. EXAM: CT ABDOMEN AND PELVIS WITH CONTRAST TECHNIQUE: Multidetector CT imaging of the abdomen and pelvis was performed using the standard protocol following bolus administration of intravenous contrast. RADIATION DOSE REDUCTION: This exam was performed according to the departmental dose-optimization program which includes automated exposure control, adjustment of the mA and/or kV according to patient size and/or use of iterative reconstruction  technique. CONTRAST:  20m OMNIPAQUE IOHEXOL 300 MG/ML  SOLN COMPARISON:  09/26/2015 FINDINGS: Lower chest: Clear lung bases. No pleural effusion. Hepatobiliary: Area of focal fatty infiltration adjacent to the falciform ligament. No suspicious liver lesion. Gallbladder physiologically distended, no calcified stone. No biliary dilatation. Pancreas: Mild parenchymal atrophy. No ductal dilatation or inflammation. Spleen: Normal in size without focal abnormality. Adrenals/Urinary Tract: Normal adrenal glands. No hydronephrosis or perinephric edema. Homogeneous renal enhancement with symmetric excretion on delayed phase imaging. No evidence of renal stone. Suspicious renal lesion. Urinary bladder is physiologically distended without wall thickening. Stomach/Bowel: Detailed bowel assessment is limited in the absence of enteric contrast. Unremarkable appearance of the stomach. Few fluid-filled loops of small bowel in the pelvis are noninflamed or abnormally dilated. No obstruction. The appendix is normal. Moderate to large volume of stool throughout the colon. Scattered colonic diverticula but no diverticulitis. No colonic inflammation. Vascular/Lymphatic: Mild aortic and branch atherosclerosis. No aneurysm. Patent portal and splenic veins. No abdominopelvic adenopathy. Reproductive: Uterus and bilateral adnexa are unremarkable. Other: No ascites or free air. Mild body wall edema. No abdominal wall hernia. Musculoskeletal: Lower lumbar facet hypertrophy. There are no acute or suspicious osseous abnormalities. IMPRESSION: 1. No acute abnormality in the abdomen/pelvis. 2. Moderate to large volume of stool throughout the colon, can be seen with constipation. Colonic diverticulosis without diverticulitis. Aortic Atherosclerosis (ICD10-I70.0). Electronically Signed   By: MKeith RakeM.D.   On: 11/19/2021 17:52    Procedures Procedures    Medications Ordered in ED Medications  sodium chloride 0.9 % bolus 500 mL (0  mLs Intravenous Stopped 11/19/21 1746)  iohexol (OMNIPAQUE) 300 MG/ML solution 80 mL (80 mLs Intravenous Contrast Given 11/19/21 1723)    ED Course/ Medical Decision Making/ A&P                           Medical Decision Making Amount and/or Complexity of Data Reviewed Labs: ordered.   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    The differential diagnosis includes hypoglycemia, dehydration, symptomatic anemia, electrolyte disturbance   The initial plan is to obtain baseline labs including CBC, CMP and obtain UA  Additional history obtained: Additional history obtained from family, sister-in-law at bedside with patient Records reviewed Primary Care Documents  Initial Assessment:   On exam, patient is alert and oriented, and in no acute distress.  Heart rate and rhythm normal, murmur appreciated on exam.  Lungs clear to auscultation bilaterally.  Abdomen is soft and nontender on palpation.  Skin is warm and dry, cap refill normal.  At time of initial assessment, patient requires wheelchair and is unable to ambulate without assistance.  Initial POC CBG was 66, RN in triage gave patient juice and crackers.  Independent ECG/labs interpretation:  The following labs were independently interpreted:  CBC reveals mild anemia, no leukocytosis. BMP reveals glucose of 101, improved after juice and crackers, elevated creatinine above patient's normal baseline. UA significant for hazy appearance, glucosuria, proteinuria, and rare bacteria.  Patient is followed by nephrology and states she typically has proteinuria.  Negative for nitrites or leukocytes, not suspicious of UTI at this time, patient has no urinary  symptoms.  Independent visualization and interpretation of imaging: I independently visualized the following imaging with scope of interpretation limited to determining acute life threatening conditions related to emergency care: CT abdomen pelvis, which revealed increased stool burden, but no other acute intra-abdominal abnormalities.  Treatment and Reassessment: Treated patient with IV fluid bolus due to elevated creatinine and suspected dehydration from decreased p.o. intake.  Patient's blood sugars have remained stable while in ED given that she has been able to eat and drink without nausea or vomiting.  Patient was able to ambulate to the bathroom with use of a walker and states she is feeling much better.  Disposition:   Based on patient's improvement following interventions and negative imaging, I believe she is stable and appropriate for discharge home with close follow-up with PCP and diabetes doctor.  Patient has been having issues with hypoglycemia at home.  I believe with decreased appetite and continuing regular diabetes medication protocol, she is overmedicated.  Will recommend to patient to discontinue using Humalog, and to continue all other diabetes related medications.  Also recommended to patient use of stool softeners to help with constipation and increase stool burden.  Patient has a Dexcom and can easily monitor her blood sugars at home.    Advised patient to schedule appointment as soon as possible for visit with her primary care and diabetes doctor.  Return precautions given.    Discussed HPI, physical exam findings, assessment and plan with attending Milton Ferguson who agrees with current plan.          Final Clinical Impression(s) / ED Diagnoses Final diagnoses:  Generalized weakness  Hypoglycemia associated with type 2 diabetes mellitus [facility])    Rx / DC Orders ED Discharge Orders     None         Pat Kocher, [place] 11/19/21 1919    Milton Ferguson,  MD 11/21/21 1100

## 2021-11-19 NOTE — Therapy (Incomplete)
OUTPATIENT PHYSICAL THERAPY NEURO TREATMENT   Patient Name: Sarah [name] MRN: [id number] DOB:1945-09-03, 76 y.o., female Today's Date: 11/19/2021   PCP: [name], [name], MD REFERRING PROVIDER: Modena Jansky, MD        Past Medical History:  Diagnosis Date   Bilateral carpal tunnel syndrome 03/27/2019   Boils    Glaucoma    Heart murmur    HTN (hypertension)    Hx of adenomatous colonic polyps    Hyperlipidemia    Osteoporosis    Pneumonia    Stroke ([place])    Type II or unspecified type diabetes mellitus without mention of complication, uncontrolled    Past Surgical History:  Procedure Laterality Date   ABDOMINAL HYSTERECTOMY     CARPAL TUNNEL RELEASE     right   COLONOSCOPY  12-10-10   per Dr. Deatra Ina, clear, repeat in 7 yrs    CORONARY STENT INTERVENTION N/A 03/11/2020   Procedure: CORONARY STENT INTERVENTION;  Surgeon: Jettie Booze, MD;  Location: [facility];  Service: Cardiovascular;  Laterality: N/A;   INCISION AND DRAINAGE PERIRECTAL ABSCESS N/A 09/26/2015   Procedure: IRRIGATION AND DEBRIDEMENT PERIRECTAL ABSCESS;  Surgeon: Mickeal Skinner, MD;  Location: [facility];  Service: General;  Laterality: N/A;   INTRAVASCULAR ULTRASOUND/IVUS N/A 03/11/2020   Procedure: Intravascular Ultrasound/IVUS;  Surgeon: Jettie Booze, MD;  Location: [facility];  Service: Cardiovascular;  Laterality: N/A;   KNEE ARTHROSCOPY     right   LEFT HEART CATH AND CORONARY ANGIOGRAPHY N/A 03/11/2020   Procedure: LEFT HEART CATH AND CORONARY ANGIOGRAPHY;  Surgeon: Jettie Booze, MD;  Location: [facility];  Service: Cardiovascular;  Laterality: N/A;   LOOP RECORDER INSERTION N/A 10/19/2016   Procedure: LOOP RECORDER INSERTION;  Surgeon: Thompson Grayer, MD;  Location: [facility];  Service: Cardiovascular;  Laterality: N/A;   ORIF ANKLE FRACTURE Right 03/24/2012   Procedure: OPEN REDUCTION INTERNAL FIXATION (ORIF) ANKLE FRACTURE;  Surgeon:  Newt Minion, MD;  Location: [facility];  Service: Orthopedics;  Laterality: Right;  Open Reduction Internal Fixation Right Bimalleolar ankle fracture   POLYPECTOMY     TEE WITHOUT CARDIOVERSION N/A 10/18/2016   Procedure: TRANSESOPHAGEAL ECHOCARDIOGRAM (TEE);  Surgeon: Fay Records, MD;  Location: [facility];  Service: Cardiovascular;  Laterality: N/A;   TONSILLECTOMY     Patient Active Problem List   Diagnosis Date Noted   Elevated troponin    Change in mental status 10/24/2021   Long term current use of antithrombotics/antiplatelets 10/02/2021   Heart murmur, systolic 25/36/6440   Dizziness 04/08/2021   Long term (current) use of antithrombotics/antiplatelets 12/16/2020   Chronic constipation 12/16/2020   Colon cancer screening 12/16/2020   History of adenomatous polyp of colon 12/16/2020   Coronary artery disease of native artery of native heart with stable angina pectoris ([place]) 03/04/2020   Diabetes mellitus ([place]) 03/03/2020   Aortic atherosclerosis ([place]) 02/12/2020   Bilateral leg cramps 01/21/2020   Bilateral lower extremity edema 01/21/2020   Anxiety disorder 01/21/2020   Type 2 diabetes mellitus with hyperglycemia, with long-term current use of insulin ([place]) 08/15/2019   Type 2 diabetes mellitus with diabetic polyneuropathy, with long-term current use of insulin ([place]) 08/15/2019   Uncontrolled type 2 diabetes mellitus with hypoglycemia without coma ([place]) 08/15/2019   Hyperlipidemia LDL goal <70 08/15/2019   Bilateral carpal tunnel syndrome 03/27/2019   Osteoarthritis of both knees 11/27/2018   CKD (chronic kidney disease), stage II 11/27/2018  GERD (gastroesophageal reflux disease) 06/26/2018   Homonymous hemianopia, left 02/08/2018   Cerebrovascular accident (CVA) due to embolism of cerebral artery ([place])    Diabetes mellitus with coincident hypertension ([place])    Trigger finger, right index finger 10/17/2017   Low back pain 07/29/2017   Carotid artery stenosis 11/02/2016    Left arm weakness    Diastolic dysfunction    Hypertension with heart disease    Acute blood loss anemia    Cerebral infarction due to embolism of right middle cerebral artery ([place])    Perirectal abscess 09/26/2015   Vitamin D deficiency 07/23/2015   Osteopenia 02/16/2013   Ankle fracture 01/19/2013   Anemia 09/27/2011   Type II diabetes mellitus with nephropathy ([place]) 09/24/2011   Mixed hyperlipidemia 09/24/2011   Primary hypertension 09/24/2011    ONSET DATE: 10/28/2021   REFERRING DIAG: I63.9 (ICD-10-CM) - Cerebrovascular accident (CVA), unspecified mechanism ([place])   THERAPY DIAG:  No diagnosis found.  Rationale for Evaluation and Treatment Rehabilitation  SUBJECTIVE:                                                                                                                                                                                             SUBJECTIVE STATEMENT: ***  Pt accompanied by: family member sister-in-law Sarah  PERTINENT HISTORY: poorly controlled DM 2, hypertension, CVA, proteinuria, CKD 3, and CAD status post DES to RCA in 03/2020; hx of 3 CVAs   PAIN:  Are you having pain? Yes: NPRS scale: 7/10 Pain location: RLE Pain description: Achy/throbbing  VITALS There were no vitals filed for this visit.     PRECAUTIONS: Fall  WEIGHT BEARING RESTRICTIONS: No  FALLS: Has patient fallen in last 6 months? Yes. Number of falls 1, fell bringing garbage cans in, RLE started hurting after that fall  LIVING ENVIRONMENT: Lives with: lives with their family; lives with mother and nephew, sister lives close by; rotating siblings Lives in: House/apartment Stairs: Yes: Internal: 12 steps; on right going up and External: 1 steps; none Has following equipment at home: Single point cane, Walker - 2 wheeled, and shower chair  PLOF: Independent with gait and Independent with transfers  PATIENT GOALS: "walking, vision"  OBJECTIVE:   DIAGNOSTIC FINDINGS:   brain MRI 10/24/21: IMPRESSION: 1. Punctate acute infarcts of the left cerebellum and left parietal lobe. No hemorrhage or mass effect. 2. Old posterior right MCA territory infarct and punctate old right cerebellar infarct.  Head angiogram 10/24/21: IMPRESSION: 1. No emergent finding. Accounting for motion artifact, no convincing change from 2021. 2. Bilateral M1 segment stenosis, particularly advanced on the right where there has  been prior MCA territory infarct.  COGNITION: Overall cognitive status: Impaired; may have had memory deficits prior to this CVA   SENSATION: Reports N/T in RLE up to thigh but light touch appears intact Proprioception dec in distal LLE  TODAY'S TREATMENT Ther Act  ***  Ther Ex  ***   GAIT: Gait pattern:  ER of RLE, decreased step length- Right, decreased step length- Left, decreased stride length, decreased hip/knee flexion- Right, decreased hip/knee flexion- Left, decreased ankle dorsiflexion- Right, decreased ankle dorsiflexion- Left, shuffling, and antalgic RLE Distance walked: various clinic distances Assistive device utilized: Walker - 4 wheeled Level of assistance: CGA Comments: also frequently running into obstacles on  L side with rollator and needs max cues for safe brake management. Max cues to sustain attention to task as pt easily distracted.  ***  PATIENT EDUCATION: Education details: Initial HEP*** Person educated: Patient and sister-in-law Sarah  Education method: Explanation, Demonstration, Tactile cues, Verbal cues, and Handouts Education comprehension: verbalized understanding  HOME EXERCISE PROGRAM: Access Code: [email or web address] URL: [email or web address] Date: 11/12/2021 Prepared by: Mickie Bail Plaster  Exercises - Sit to Stand Without Arm Support  - 1 x daily - 7 x weekly - 3 sets - 10 reps - Standing March with Counter Support  - 1 x daily - 7 x weekly - 3 sets - 10 reps - Heel Toe Raises with Counter Support  -  1 x daily - 7 x weekly - 3 sets - 10 reps   GOALS: Goals reviewed with patient? Yes  SHORT TERM GOALS: Target date: 12/03/2021  Pt will be independent with initial HEP for improved strength, balance, transfers and gait. Baseline: Goal status: INITIAL  2.  Pt will improve gait velocity to at least 1.5 ft/sec for improved gait efficiency and performance at S* level  Baseline: 1.19 ft/sec with rollator and S* (10/26) Goal status: INITIAL  3.  Pt will improve normal TUG to less than or equal to 45 seconds for improved functional mobility and decreased fall risk. Baseline: 59.52 sec with rollator Goal status: INITIAL  4.  Pt will improve Berg score to 28/56 for decreased fall risk  Baseline: 23/56 Goal status: REVISED   LONG TERM GOALS: Target date: 12/31/2021  Pt will be independent with final HEP for improved strength, balance, transfers and gait. Baseline:  Goal status: INITIAL  2.  Pt will improve gait velocity to at least 2.0 ft/sec for improved gait efficiency and performance at S* level  Baseline: 1.19 ft/sec with S* and rollator (10/26) Goal status: INITIAL  3.  Pt will improve normal TUG to less than or equal to 30 seconds for improved functional mobility and decreased fall risk. Baseline: 59.52 sec with rollator Goal status: INITIAL  4.  Pt will improve Berg score to 34/56 for decreased fall risk  Baseline: 23/56 Goal status: REVISED   ASSESSMENT:  CLINICAL IMPRESSION: Emphasis of skilled PT session on *** Continue POC.   OBJECTIVE IMPAIRMENTS: Abnormal gait, cardiopulmonary status limiting activity, decreased activity tolerance, decreased balance, decreased cognition, decreased coordination, decreased endurance, decreased knowledge of condition, decreased knowledge of use of DME, decreased mobility, difficulty walking, decreased ROM, decreased strength, decreased safety awareness, impaired sensation, and impaired vision/preception.   ACTIVITY LIMITATIONS:  carrying, lifting, bending, squatting, stairs, transfers, and bed mobility  PARTICIPATION LIMITATIONS: driving and community activity  PERSONAL FACTORS: Age and 1-2 comorbidities:    poorly controlled DM 2, hypertension, CVA, proteinuria, CKD 3, and CAD status post DES to RCA in 03/2020 are also affecting  patient's functional outcome.   REHAB POTENTIAL: Good  CLINICAL DECISION MAKING: Stable/uncomplicated  EVALUATION COMPLEXITY: Moderate  PLAN:  PT FREQUENCY: 2x/week  PT DURATION: 8 weeks  PLANNED INTERVENTIONS: Therapeutic exercises, Therapeutic activity, Neuromuscular re-education, Balance training, Gait training, Patient/Family education, Self Care, Joint mobilization, Stair training, Vestibular training, Canalith repositioning, Visual/preceptual remediation/compensation, Orthotic/Fit training, DME instructions, Aquatic Therapy, Dry Needling, Cognitive remediation, Electrical stimulation, Cryotherapy, Moist heat, Taping, Manual therapy, and Re-evaluation  PLAN FOR NEXT SESSION: add to HEP prn, work on safe rollator use with transfers and brake management, work on bed mobility from elevated surface (have sister measure bed height), increasing step length and LE clearance with gait, turns, reaching out of BOS***   Excell Seltzer, PT, DPT, CSRS 11/19/2021, 9:58 AM

## 2021-11-19 NOTE — Discharge Instructions (Addendum)
You are seen in the ER for generalized weakness and problems with hypoglycemia.  Your imaging and laboratory work-up are reassuring.  I believe that with your reduced appetite you are overmedicated for your diabetes.  Therefore, I am recommending that you discontinue use of your Humalog.  Please continue taking all other medications as prescribed.  I recommend calling your primary care and diabetes doctors tomorrow and getting an to see them as soon as possible.  Continue to monitor your blood sugar at home.   It is important that you remain hydrated, try to drink as much water as possible and avoid caffeinated drinks as these can be dehydrating.  Return to the ER if you develop new or worsening symptoms.

## 2021-11-19 NOTE — ED Notes (Signed)
Patient ambulated in the bathroom with walker.

## 2021-11-20 ENCOUNTER — Telehealth: Payer: Self-pay | Admitting: Family Medicine

## 2021-11-20 ENCOUNTER — Ambulatory Visit (INDEPENDENT_AMBULATORY_CARE_PROVIDER_SITE_OTHER): Payer: Medicare Other | Admitting: Family Medicine

## 2021-11-20 ENCOUNTER — Telehealth: Payer: Self-pay

## 2021-11-20 ENCOUNTER — Encounter: Payer: Self-pay | Admitting: Family Medicine

## 2021-11-20 ENCOUNTER — Encounter: Payer: Self-pay | Admitting: Internal Medicine

## 2021-11-20 VITALS — BP 100/60 | HR 99 | Resp 16 | Ht 60.0 in

## 2021-11-20 DIAGNOSIS — I63411 Cerebral infarction due to embolism of right middle cerebral artery: Secondary | ICD-10-CM

## 2021-11-20 DIAGNOSIS — E1121 Type 2 diabetes mellitus with diabetic nephropathy: Secondary | ICD-10-CM

## 2021-11-20 DIAGNOSIS — K5900 Constipation, unspecified: Secondary | ICD-10-CM

## 2021-11-20 DIAGNOSIS — N182 Chronic kidney disease, stage 2 (mild): Secondary | ICD-10-CM

## 2021-11-20 DIAGNOSIS — I119 Hypertensive heart disease without heart failure: Secondary | ICD-10-CM | POA: Diagnosis not present

## 2021-11-20 DIAGNOSIS — M79604 Pain in right leg: Secondary | ICD-10-CM | POA: Diagnosis not present

## 2021-11-20 NOTE — Progress Notes (Addendum)
HPI: Sarah [name] is a 76 y.o. female with medical hx significant for CAD,CVA,poorly controlled DM II, CKD III, anxiety, PAD, and GERD here today with a family member to follow on recent OV and ED visit. She was evaluated in the ED yesterday because generalized weakness,nausea,and vomiting.  She reports that her nausea and vomiting have improved since the ED visit, and she has not vomited since yesterday.  She has not had a bowel movement in at least three days, but she denies experiencing abdominal cramps. She confirms that she is passing gas.  Abdominal/pelvic CT yesterday negative for acute process. 1. No acute abnormality in the abdomen/pelvis. 2. Moderate to large volume of stool throughout the colon, can be seen with constipation. Colonic diverticulosis without diverticulitis.   Aortic Atherosclerosis (ICD10-I70.0). She has also had hypoglycemic events, BS's 50's.  States that today she received a call from her endocrinologist's office instructing her to stop Humalog and decrease Toujeo dose from 16 to 12 U. She is still on Farxiga 10 mg daily and victoza 1.8 mg daily.  Lab Results  Component Value Date   HGBA1C 12.6 (H) 10/25/2021   Family is also concerned about poor oral intake since hospital discharge.   BP mildly low today. She is not checking BP at home. She is wearing a heart monitor.  Denies severe/frequent headache, visual changes, chest pain, dyspnea, palpitation, focal weakness, or worsening edema. Currently she is on Losartan 100 mg daily, Amlodipine 5 mg daily, Hydralazine 10 mg tid.  CKD III and proteinuria, follows with nephrologist. Not sure about next follow up appt.  Lab Results  Component Value Date   CREATININE 1.36 (H) 11/19/2021   BUN 22 11/19/2021   NA 137 11/19/2021   K 4.3 11/19/2021   CL 104 11/19/2021   CO2 24 11/19/2021   Lab Results  Component Value Date   WBC 10.2 11/19/2021   HGB 11.7 (L) 11/19/2021   HCT 36.8 11/19/2021    MCV 97.4 11/19/2021   PLT 376 11/19/2021   She is experiencing RLE pain, that seems to be radiated from back. She has been taking Tramadol for pain relief, but it has not been effective. She requested prescription for Tramadol on 10/20/21 after trying her mother's medication and reported that helped with pain.  She has not tried acetaminophen, according to family member, she was instructed not to take Tylenol due to kidney disease. She is planning on using capsaicin ointment for her leg pain and she is taking Gabapentin 200 mg as well.  RLE pain is consistent with the pain she reported during her visit on 10/20/21. It is not exacerbated with ambulation, it is worse when resting in bed at night. No associated erythema.  Review of Systems  Constitutional:  Positive for activity change, appetite change and fatigue. Negative for fever.  HENT:  Negative for mouth sores, nosebleeds and sore throat.   Respiratory:  Negative for cough and wheezing.   Gastrointestinal:  Negative for blood in stool.  Genitourinary:  Negative for decreased urine volume, dysuria and hematuria.  Musculoskeletal:  Positive for arthralgias, back pain and gait problem.  Skin:  Negative for rash.  Psychiatric/Behavioral:  Negative for confusion and hallucinations.   Rest see pertinent positives and negatives per HPI.  Current Outpatient Medications on File Prior to Visit  Medication Sig Dispense Refill   amLODipine (NORVASC) 5 MG tablet Take 1 tablet (5 mg total) by mouth daily. 90 tablet 3   atorvastatin (LIPITOR) 40 MG tablet  Take 2 tablets (80 mg total) by mouth daily. 180 tablet 3   clopidogrel (PLAVIX) 75 MG tablet TAKE 1 TABLET(75 MG) BY MOUTH DAILY (Patient taking differently: Take 75 mg by mouth daily.) 90 tablet 1   dapagliflozin propanediol (FARXIGA) 10 MG TABS tablet Take 1 tablet (10 mg total) by mouth daily. 90 tablet 3   ezetimibe (ZETIA) 10 MG tablet Take 1 tablet (10 mg total) by mouth daily. 90 tablet 3    furosemide (LASIX) 20 MG tablet TAKE 1 TABLET(20 MG) BY MOUTH DAILY (Patient taking differently: 40 mg.) 30 tablet 2   hydrALAZINE (APRESOLINE) 10 MG tablet Take 1 tablet (10 mg total) by mouth 3 (three) times daily. 240 tablet 3   insulin glargine, 1 Unit Dial, (TOUJEO SOLOSTAR) 300 UNIT/ML Solostar Pen Inject 16 Units into the skin daily in the afternoon. 15 mL 3   insulin lispro (HUMALOG KWIKPEN) 100 UNIT/ML KwikPen Max daily 25 units 30 mL 3   Insulin Pen Needle 32G X 4 MM MISC 1 Device by Does not apply route in the morning, at noon, in the evening, and at bedtime. 400 each 3   latanoprost (XALATAN) 0.005 % ophthalmic solution Place 1 drop into both eyes at bedtime.     liraglutide (VICTOZA) 18 MG/3ML SOPN Inject into the skin daily.     losartan (COZAAR) 50 MG tablet TAKE 1 TABLET(50 MG) BY MOUTH DAILY (Patient taking differently: Take 100 mg by mouth daily.) 90 tablet 1   Magnesium 400 MG TABS Take 400 mg by mouth daily.     Na Sulfate-K Sulfate-Mg Sulf (SUPREP BOWEL PREP KIT) 17.5-3.13-1.6 GM/177ML SOLN Take 1 kit by mouth as directed. 324 mL 0   nitroGLYCERIN (NITROSTAT) 0.4 MG SL tablet Place 1 tablet (0.4 mg total) under the tongue every 5 (five) minutes as needed for chest pain. one tab every 5 minutes up to 3 tablets total over 15 minutes. 25 tablet 3   ondansetron (ZOFRAN) 4 MG tablet Take 1 tablet (4 mg total) by mouth every 8 (eight) hours as needed for nausea or vomiting. 20 tablet 0   sertraline (ZOLOFT) 50 MG tablet Take 1 tablet (50 mg total) by mouth daily. 90 tablet 1   gabapentin (NEURONTIN) 100 MG capsule Take 1-2 capsules (100-200 mg total) by mouth at bedtime. 60 capsule 0   No current facility-administered medications on file prior to visit.   Past Medical History:  Diagnosis Date   Bilateral carpal tunnel syndrome 03/27/2019   Boils    Glaucoma    Heart murmur    HTN (hypertension)    Hx of adenomatous colonic polyps    Hyperlipidemia    Osteoporosis     Pneumonia    Stroke ([place])    Type II or unspecified type diabetes mellitus without mention of complication, uncontrolled    No Known Allergies  Social History   Socioeconomic History   Marital status: Single    Spouse name: Not on file   Number of children: Not on file   Years of education: Not on file   Highest education level: Not on file  Occupational History   Not on file  Tobacco Use   Smoking status: Former    Types: Cigarettes    Quit date: 10/15/2016    Years since quitting: 5.1   Smokeless tobacco: Never   Tobacco comments:    smokes occ.   Vaping Use   Vaping Use: Never used  Substance and Sexual Activity   Alcohol  use: Yes    Alcohol/week: 0.0 standard drinks of alcohol    Comment: occ   Drug use: No   Sexual activity: Not on file  Other Topics Concern   Not on file  Social History Narrative   Not on file   Social Determinants of Health   Financial Resource Strain: Low Risk  (04/29/2021)   Overall Financial Resource Strain (CARDIA)    Difficulty of Paying Living Expenses: Not hard at all  Food Insecurity: No Food Insecurity (10/30/2021)   Hunger Vital Sign    Worried About Running Out of Food in the Last Year: Never true    Ran Out of Food in the Last Year: Never true  Transportation Needs: Unmet Transportation Needs (10/30/2021)   PRAPARE - Hydrologist (Medical): Yes    Lack of Transportation (Non-Medical): No  Physical Activity: Insufficiently Active (04/29/2021)   Exercise Vital Sign    Days of Exercise per Week: 1 day    Minutes of Exercise per Session: 30 min  Stress: No Stress Concern Present (04/29/2021)   [place]    Feeling of Stress : Not at all  Social Connections: Socially Isolated (04/29/2021)   Social Connection and Isolation Panel [NHANES]    Frequency of Communication with Friends and Family: More than three times a week    Frequency of  Social Gatherings with Friends and Family: More than three times a week    Attends Religious Services: Never    Marine scientist or Organizations: No    Attends Archivist Meetings: Never    Marital Status: Never married   Vitals:   11/20/21 1237  BP: 100/60  Pulse: 99  Resp: 16  SpO2: 97%  Body mass index is 22.46 kg/m.  Physical Exam Vitals and nursing note reviewed.  Constitutional:      General: She is not in acute distress.    Appearance: She is well-developed.  HENT:     Head: Normocephalic and atraumatic.     Mouth/Throat:     Mouth: Mucous membranes are moist.  Eyes:     Conjunctiva/sclera: Conjunctivae normal.  Cardiovascular:     Rate and Rhythm: Normal rate and regular rhythm.     Heart sounds: No murmur heard.    Comments: Cannot find DP pulse, right. Left DP pulse palpable. Pulmonary:     Effort: Pulmonary effort is normal. No respiratory distress.     Breath sounds: Normal breath sounds.  Abdominal:     Palpations: Abdomen is soft.     Tenderness: There is no abdominal tenderness.  Musculoskeletal:     Right lower leg: No edema.     Left lower leg: No edema.  Skin:    General: Skin is warm.     Findings: No erythema or rash.  Neurological:     General: No focal deficit present.     Mental Status: She is alert and oriented to person, place, and time.     Comments: Unstable gait assisted by a walker.  Psychiatric:        Mood and Affect: Mood and affect normal.   ASSESSMENT AND PLAN:  Ms Magdelena was seen today for follow-up.  Diagnoses and all orders for this visit:  Constipation, unspecified constipation type Recommend Miralax daily until she has a good bowel movement then daily as needed. She can also add Bisacodyl 5 mg daily as needed. Educated about the importance  of adequate fiber and fluid intake. Instructed about warning signs.  Pain of right lower extremity We discussed possible etiologies. ? Radicular pain. Today I  could not find her right DP pulse, no signs of acute ischemia. ABI will be arranged.  PT can be arranged to help with fall prevention.       Currently taking Tramadol 50 mg once daily, but if she feels it is not helping, recommend d/c it. Side effects discussed, including worsening constipation.      Allowed to take Tylenol for pain management, up to 2 grams per day.      Avoid NSAIDs due to comorbilities.     She understands possible risk of med interaction between Tramadol and Sertraline.     Try icy hot or asper cream on affected area. If pain continues.  Type II diabetes mellitus with nephropathy (HCC) Having episodes of hypoglycemia due to poor oral intake. Her treatment has already been adjusted by her endocrinologist. Continue monitoring blood sugars closely. Encouraged to have small bites every couple hours throughout the day to prevent hypoglycemic events. Instructed about warning signs. She has a follow-up appointment with endocrinologist.  Hypertension with heart disease BP mildly low today. Instructed to start monitoring BP at home. For now continue losartan 100 mg daily, amlodipine 5 mg daily, and metoprolol succinate 100 mg daily. Low-salt diet to continue. Follow-up in 4 weeks.  CKD (chronic kidney disease), stage II Stressed the importance of adequate hydration and BP/glucose control. Continue avoiding NSAIDs. Low-salt diet recommended. She is not sure about her next appointment with nephrologist, she will check to be sure she has one scheduled.  I spent a total of 43 minutes in both face to face and non face to face activities for this visit on the date of this encounter. During this time history was obtained and documented, examination was performed, prior labs/imaging reviewed, and assessment/plan discussed.  Return in about 4 weeks (around 12/18/2021).   G. [name], MD  [facility]. [place] office.

## 2021-11-20 NOTE — Assessment & Plan Note (Signed)
BP mildly low today. Instructed to start monitoring BP at home. For now continue losartan 100 mg daily, amlodipine 5 mg daily, and metoprolol succinate 100 mg daily. Low-salt diet to continue. Follow-up in 4 weeks.

## 2021-11-20 NOTE — Patient Instructions (Addendum)
A few things to remember from today's visit:  CKD (chronic kidney disease), stage II  Pain of right lower extremity  Constipation, unspecified constipation type  Type II diabetes mellitus with nephropathy ([place])  Be sure you have an appt with nephrologist.  For leg pain continue Tramadol, if not helping stop it.This medication can aggravate constipation. Acetaminophen 500 mg 3-4 times per day.  Topical icy hot or asper cream may also help.  Small bites of food a few times per day. Continue monitoring blood sugar. Monitor blood pressure, mildly low today.  Miralax daily and if still constipated Bisacodyl 5 mg daily as needed. Try to resume PT.  If you need refills for medications you take chronically, please call your pharmacy. Do not use My Chart to request refills or for acute issues that need immediate attention. If you send a my chart message, it may take a few days to be addressed, specially if I am not in the office.  Please be sure medication list is accurate. If a new problem present, please set up appointment sooner than planned today.

## 2021-11-20 NOTE — Telephone Encounter (Signed)
Patient is going to come in today to be seen by PCP.

## 2021-11-20 NOTE — Telephone Encounter (Signed)
Pt sister call back and stated this is the phone # to call when you call back [phone].

## 2021-11-20 NOTE — Telephone Encounter (Signed)
Patient sister Peggey states that she went to the hospital on 11/19/21 for hypoglycemia and patient was advised that she was on to much diabetes medication and they advise her to stop the Humalog completed. Patient sugar was in the 60-70 range. I printed out the Dexcom but it stops at 11/03/21.   [phone] [facility]

## 2021-11-20 NOTE — Telephone Encounter (Signed)
Patient had a question on whether she could wash her hair, PCP said it was okay. I left pt's sister a voicemail letting her know it was okay.

## 2021-11-20 NOTE — Telephone Encounter (Signed)
Pt sister peggy is calling and pt had ct scan of abd yesterday and was told she is full of stool and sister would like rx for constipation. Pt has tried OTC gentle laxative for 2 days  no relief, magnesium citrate gummies had a little relief that was last wk. Pt has not had bowel movement in 4 days. Pt did go to er yesterday for weakness and not eating. Please advise  [facility] [place], [place] AT [address] Phone: [phone]  Fax: [phone]    Pt does has er follow up on 11-23-2021

## 2021-11-20 NOTE — Assessment & Plan Note (Signed)
Stressed the importance of adequate hydration and BP/glucose control. Continue avoiding NSAIDs. Low-salt diet recommended. She is not sure about her next appointment with nephrologist, she will check to be sure she has one scheduled.

## 2021-11-20 NOTE — Assessment & Plan Note (Signed)
Having episodes of hypoglycemia due to poor oral intake. Her treatment has already been adjusted by her endocrinologist. Continue monitoring blood sugars closely. Encouraged to have small bites every couple hours throughout the day to prevent hypoglycemic events. Instructed about warning signs. She has a follow-up appointment with endocrinologist.

## 2021-11-20 NOTE — Telephone Encounter (Signed)
Patient sister [name] has been advised of the medication change and verbalized understanding.

## 2021-11-21 ENCOUNTER — Telehealth: Payer: Self-pay | Admitting: Internal Medicine

## 2021-11-21 NOTE — Telephone Encounter (Signed)
Received [employer]. Patient had two asymptomatic episode of NSVT (one episode was 9 second and another episode was 6 beats) tonight.  Thanks

## 2021-11-23 ENCOUNTER — Telehealth: Payer: Self-pay

## 2021-11-23 ENCOUNTER — Inpatient Hospital Stay: Payer: Medicare Other | Admitting: Family Medicine

## 2021-11-23 DIAGNOSIS — Z5189 Encounter for other specified aftercare: Secondary | ICD-10-CM | POA: Diagnosis not present

## 2021-11-23 NOTE — Telephone Encounter (Signed)
Pt is returning call and requesting a return call.

## 2021-11-23 NOTE — Telephone Encounter (Signed)
   Cardiac Monitor Alert  Date of alert:  11/23/2021   Patient Name: Sarah [name]  DOB: Apr 06, 1945  MRN: [id number]   [facility] Cardiologist: Werner Lean, MD  [facility] EP:  NONE  Monitor Information: Cardiac Event Monitor [[employer]]  Reason:  CVA and Afib  Ordering provider:  Dr. Whitney Post   Alert Ventricular Tachycardia This is the 1st alert for this rhythm.   Next Cardiology Appointment   Date:  12/08/2021  Provider:  Ermalinda Barrios, PA  The patient was contacted today.  She is asymptomatic. Arrhythmia, symptoms and history reviewed with Dr. Angelena Form.   Plan:  continue to monitor   Tor Netters, RN  11/23/2021 11:04 AM

## 2021-11-24 ENCOUNTER — Ambulatory Visit: Payer: Medicare Other | Admitting: Occupational Therapy

## 2021-11-24 ENCOUNTER — Ambulatory Visit: Payer: Medicare Other | Admitting: Physical Therapy

## 2021-11-24 ENCOUNTER — Encounter: Payer: Self-pay | Admitting: Physical Therapy

## 2021-11-24 VITALS — BP 125/57

## 2021-11-24 DIAGNOSIS — R278 Other lack of coordination: Secondary | ICD-10-CM

## 2021-11-24 DIAGNOSIS — R293 Abnormal posture: Secondary | ICD-10-CM | POA: Diagnosis not present

## 2021-11-24 DIAGNOSIS — I69318 Other symptoms and signs involving cognitive functions following cerebral infarction: Secondary | ICD-10-CM | POA: Diagnosis not present

## 2021-11-24 DIAGNOSIS — R2681 Unsteadiness on feet: Secondary | ICD-10-CM | POA: Diagnosis not present

## 2021-11-24 DIAGNOSIS — R262 Difficulty in walking, not elsewhere classified: Secondary | ICD-10-CM | POA: Diagnosis not present

## 2021-11-24 DIAGNOSIS — R2689 Other abnormalities of gait and mobility: Secondary | ICD-10-CM

## 2021-11-24 DIAGNOSIS — R41842 Visuospatial deficit: Secondary | ICD-10-CM

## 2021-11-24 DIAGNOSIS — R413 Other amnesia: Secondary | ICD-10-CM

## 2021-11-24 DIAGNOSIS — R41841 Cognitive communication deficit: Secondary | ICD-10-CM

## 2021-11-24 DIAGNOSIS — M6281 Muscle weakness (generalized): Secondary | ICD-10-CM

## 2021-11-24 NOTE — Progress Notes (Signed)
Cardiology Office Note:    Date:  12/08/2021   ID:  MELLANIE BEJARANO, DOB 1945/12/11, MRN [id number]  PCP:  [name], [name], MD  [place] [facility] Cardiologist:  Werner Lean, MD Cardiology APP:  [facility]     Referring MD: [name], [name], MD   Chief Complaint:  Hospitalization Follow-up     History of Present Illness:   Sarah [name] is a 76 y.o. female with history of hypertension, remote R MCA stroke with residual partial left homonymous hemianopsia of unclear etiology, remote tobacco use history- quit smoking in 2018, osteoporosis, CAD s/p DES to RCA 03/2020 and DM2   atient saw Dr. Gasper Sells 03/2021 with dizziness and new heart murmur. Echo with Normal LVEF G1DD, sm pericardial effusion, mild MR, aortic valve sclerosis. Carotids stable 1-39% bilateral. F/u in 3 yrs.   I saw the patient 10/07/21  for preop clearance for colonoscopy 11/11/21 colonoscopy  by Dr. Rush Landmark and needs to hold plavix for 5 days. Dr. Gasper Sells ok'ed plavix hold for renal biopsy in June. Patient still complains of being very unsteady since her CVA 2018. No chest pain, sometimes has to take a deep breath going upstairs. Has some ankle swelling. Drinks a lot of soda. On low dose lasix and has compression stockings. Amlodipine increased 10 mg 6 months ago when swelling started.  I decreased amlodipine 5 mg daily and added hydralazine 10 mg tid for BP. I cleared her for colonoscopy.  I saw the patient 10/21/21 Still has leg swelling and kidney specialist increase lasix 40 mg daily. She's getting extra salt with frozen dinners and canned soups. She isn't sure she's taking hydralazine. We called the pharmacy and she picked it up but doesn't think she's taking it. BP was up. She was to verify if she was taking hydralazine. Also asked to f/u with Dr. Leonie Man for being off balance.    She was admitted with acute CVA 10/23/21 and elevated troponins felt to be demand  ischemia. Echo LVEF 60%-65% G1DD.   She comes in with her sister. She is very tired and weak since her stroke. Vision and balance are off and she has had some falls. She is tired and doesn't want to go to PT/OT  today.  Has lost 13 lbs since last month. Appetite better since decreasing victoza.    Past Medical History:  Diagnosis Date   Bilateral carpal tunnel syndrome 03/27/2019   Boils    Glaucoma    Heart murmur    HTN (hypertension)    Hx of adenomatous colonic polyps    Hyperlipidemia    Osteoporosis    Pneumonia    Stroke ([place])    Type II or unspecified type diabetes mellitus without mention of complication, uncontrolled    Current Medications: Current Meds  Medication Sig   amLODipine (NORVASC) 5 MG tablet Take 1 tablet (5 mg total) by mouth daily.   atorvastatin (LIPITOR) 40 MG tablet Take 2 tablets (80 mg total) by mouth daily.   clopidogrel (PLAVIX) 75 MG tablet TAKE 1 TABLET(75 MG) BY MOUTH DAILY (Patient taking differently: Take 75 mg by mouth daily.)   dapagliflozin propanediol (FARXIGA) 10 MG TABS tablet Take 1 tablet (10 mg total) by mouth daily.   ezetimibe (ZETIA) 10 MG tablet Take 1 tablet (10 mg total) by mouth daily.   furosemide (LASIX) 20 MG tablet Take 1 tablet by mouth daily (only as needed)   hydrALAZINE (APRESOLINE) 10 MG tablet Take 1  tablet (10 mg total) by mouth 3 (three) times daily.   insulin glargine, 1 Unit Dial, (TOUJEO SOLOSTAR) 300 UNIT/ML Solostar Pen Inject 16 Units into the skin daily in the afternoon. (Patient taking differently: Inject 12 Units into the skin daily in the afternoon.)   Insulin Pen Needle 32G X 4 MM MISC 1 Device by Does not apply route in the morning, at noon, in the evening, and at bedtime.   latanoprost (XALATAN) 0.005 % ophthalmic solution Place 1 drop into both eyes at bedtime.   liraglutide (VICTOZA) 18 MG/3ML SOPN Inject 1.2 mg into the skin daily.   losartan (COZAAR) 50 MG tablet TAKE 1 TABLET(50 MG) BY MOUTH DAILY  (Patient taking differently: Take 50 mg by mouth daily.)   nitroGLYCERIN (NITROSTAT) 0.4 MG SL tablet Place 1 tablet (0.4 mg total) under the tongue every 5 (five) minutes as needed for chest pain. one tab every 5 minutes up to 3 tablets total over 15 minutes.   ondansetron (ZOFRAN) 4 MG tablet Take 1 tablet (4 mg total) by mouth every 8 (eight) hours as needed for nausea or vomiting.   sertraline (ZOLOFT) 50 MG tablet Take 1 tablet (50 mg total) by mouth daily.   [DISCONTINUED] furosemide (LASIX) 20 MG tablet TAKE 1 TABLET(20 MG) BY MOUTH DAILY (Patient taking differently: 20 mg.)    Allergies:   Patient has no known allergies.   Social History   Tobacco Use   Smoking status: Former    Types: Cigarettes    Quit date: 10/15/2016    Years since quitting: 5.1   Smokeless tobacco: Never   Tobacco comments:    smokes occ.   Vaping Use   Vaping Use: Never used  Substance Use Topics   Alcohol use: Yes    Alcohol/week: 0.0 standard drinks of alcohol    Comment: occ   Drug use: No    Family Hx: The patient's family history includes Diabetes in her mother; Hypertension in her mother; Stroke in her father, maternal uncle, and paternal uncle. There is no history of Colon cancer, Esophageal cancer, Rectal cancer, Stomach cancer, Inflammatory bowel disease, Liver disease, or Pancreatic cancer.  ROS     Physical Exam:    VS:  BP 138/68 (BP Location: Left Arm, Patient Position: Sitting, Cuff Size: Normal)   Pulse 97   Ht 5' (1.524 m)   Wt 108 lb 3.2 oz (49.1 kg)   SpO2 94%   BMI 21.13 kg/m     Wt Readings from Last 3 Encounters:  12/08/21 108 lb 3.2 oz (49.1 kg)  11/30/21 115 lb (52.2 kg)  11/17/21 115 lb (52.2 kg)    Physical Exam  GEN: Well nourished, well developed, in no acute distress  Neck: no JVD, carotid bruits, or masses Cardiac:RRR; no murmurs, rubs, or gallops  Respiratory:  clear to auscultation bilaterally, normal work of breathing GI: soft, nontender, nondistended,  + BS Ext: without cyanosis, clubbing, or edema, Good distal pulses bilaterally Neuro:  Alert and Oriented x 3,  Psych: euthymic mood, full affect        EKGs/Labs/Other Test Reviewed:    EKG:  EKG is  not ordered today.     Recent Labs: 10/24/2021: TSH 1.591 10/26/2021: Magnesium 2.0 11/19/2021: BUN 22; Creatinine, Ser 1.36; Hemoglobin 11.7; Platelets 376; Potassium 4.3; Sodium 137   Recent Lipid Panel Recent Labs    10/24/21 0512  CHOL 362*  TRIG 210*  HDL 93  VLDL 42*  LDLCALC 227*  Prior CV Studies:    Echo 10/24/21  IMPRESSIONS     1. Left ventricular ejection fraction, by estimation, is 60 to 65%. The  left ventricle has normal function. The left ventricle has no regional  wall motion abnormalities. There is moderate left ventricular hypertrophy.  Left ventricular diastolic  parameters are consistent with Grade I diastolic dysfunction (impaired  relaxation).   2. Right ventricular systolic function is normal. The right ventricular  size is normal. Tricuspid regurgitation signal is inadequate for assessing  PA pressure.   3. The mitral valve is grossly normal. Mild mitral valve regurgitation.  No evidence of mitral stenosis.   4. The aortic valve is tricuspid. There is mild calcification of the  aortic valve. There is mild thickening of the aortic valve. Aortic valve  regurgitation is trivial. Aortic valve sclerosis/calcification is present,  without any evidence of aortic  stenosis.   5. The inferior vena cava is normal in size with greater than 50%  respiratory variability, suggesting right atrial pressure of 3 mmHg.   Conclusion(s)/Recommendation(s): No intracardiac source of embolism  detected on this transthoracic study. Consider a transesophageal  echocardiogram to exclude cardiac source of embolism if clinically  indicated. Future echocardiograms should include  global longitudinal strain assessment in setting of moderate LVH.    Risk  Assessment/Calculations/Metrics:              ASSESSMENT & PLAN:   No problem-specific Assessment & Plan notes found for this encounter.   Acute CVA: Followed by neurology, recommend DAPT x3 weeks then Plavix alone. 30-day monitor  not back yet. On plavix, followed by neuro and doing PT, OT  Elevated troponin: 230 > 268.  Echo 10/14 with EF 60 to 65%, moderate LVH, G1 DD, normal RV function, mild MR.  Likely demand ischemia in setting of acute CVA.   no chest pain or cardiac complaints. Will reduce lasix to prn and check renal today  Weak, weight loss, failure to thrive since CVA. Has lost 13 lbs. Little appetite. Trouble swallowing pills. Change lasix to prn. She has appt with renal in Dec.   Acute CVA: Followed by neurology, recommend DAPT x3 weeks then Plavix alone. 30-day monitor  not back yet. On plavix, followed by neuro and doing PT, OT   HTN BP controlled  CAD DES RCA 03/2020-no angina on Plavix  DM per PCP  Remote RMCA stroke with residual partial left homonymous hemianopsia unclear etiology.          Dispo:  No follow-ups on file.   Medication Adjustments/Labs and Tests Ordered: Current medicines are reviewed at length with the patient today.  Concerns regarding medicines are outlined above.  Tests Ordered: Orders Placed This Encounter  Procedures   Basic metabolic panel   Medication Changes: Meds ordered this encounter  Medications   furosemide (LASIX) 20 MG tablet    Sig: Take 1 tablet by mouth daily (only as needed)    Dispense:  90 tablet    Refill:  3   Signed, Ermalinda Barrios, PA-C  12/08/2021 1:41 PM    [address], [place], [place]  [address] Phone: [phone]; Fax: [phone]

## 2021-11-24 NOTE — Therapy (Signed)
OUTPATIENT OCCUPATIONAL THERAPY NEURO TREATMENT  Patient Name: Sarah [name] MRN: [id number] DOB:1945-10-06, 76 y.o., female Today's Date: 11/24/2021  PCP: [name], [name], MD  REFERRING PROVIDER: PCP: [name], [name], MD     OT End of Session - 11/24/21 1450      Visit Number 3    Number of Visits 25     Date for OT Re-Evaluation 02/04/22     Authorization Type [employer] MCR     OT Start Time 1450    OT Stop Time 6629    OT Time Calculation (min) 24 min     Activity Tolerance Patient tolerated treatment well     Behavior During Therapy [facility] for tasks assessed/performed      Past Medical History:  Diagnosis Date   Bilateral carpal tunnel syndrome 03/27/2019   Boils    Glaucoma    Heart murmur    HTN (hypertension)    Hx of adenomatous colonic polyps    Hyperlipidemia    Osteoporosis    Pneumonia    Stroke ([place])    Type II or unspecified type diabetes mellitus without mention of complication, uncontrolled    Past Surgical History:  Procedure Laterality Date   ABDOMINAL HYSTERECTOMY     CARPAL TUNNEL RELEASE     right   COLONOSCOPY  12-10-10   per Dr. Deatra Ina, clear, repeat in 7 yrs    CORONARY STENT INTERVENTION N/A 03/11/2020   Procedure: CORONARY STENT INTERVENTION;  Surgeon: Jettie Booze, MD;  Location: [place] [facility];  Service: Cardiovascular;  Laterality: N/A;   INCISION AND DRAINAGE PERIRECTAL ABSCESS N/A 09/26/2015   Procedure: IRRIGATION AND DEBRIDEMENT PERIRECTAL ABSCESS;  Surgeon: Mickeal Skinner, MD;  Location: [place];  Service: General;  Laterality: N/A;   INTRAVASCULAR ULTRASOUND/IVUS N/A 03/11/2020   Procedure: Intravascular Ultrasound/IVUS;  Surgeon: Jettie Booze, MD;  Location: [place] [facility];  Service: Cardiovascular;  Laterality: N/A;   KNEE ARTHROSCOPY     right   LEFT HEART CATH AND CORONARY ANGIOGRAPHY N/A 03/11/2020   Procedure: LEFT HEART CATH AND CORONARY ANGIOGRAPHY;  Surgeon: Jettie Booze, MD;  Location: [place] [facility];  Service: Cardiovascular;  Laterality: N/A;   LOOP RECORDER INSERTION N/A 10/19/2016   Procedure: LOOP RECORDER INSERTION;  Surgeon: Thompson Grayer, MD;  Location: [place] [facility];  Service: Cardiovascular;  Laterality: N/A;   ORIF ANKLE FRACTURE Right 03/24/2012   Procedure: OPEN REDUCTION INTERNAL FIXATION (ORIF) ANKLE FRACTURE;  Surgeon: Newt Minion, MD;  Location: [place];  Service: Orthopedics;  Laterality: Right;  Open Reduction Internal Fixation Right Bimalleolar ankle fracture   POLYPECTOMY     TEE WITHOUT CARDIOVERSION N/A 10/18/2016   Procedure: TRANSESOPHAGEAL ECHOCARDIOGRAM (TEE);  Surgeon: Fay Records, MD;  Location: [facility] ENDOS[facility];  Service: Cardiovascular;  Laterality: N/A;   TONSILLECTOMY     Patient Active Problem List   Diagnosis Date Noted   Elevated troponin    Change in mental status 10/24/2021   Long term current use of antithrombotics/antiplatelets 10/02/2021   Heart murmur, systolic 47/65/4650   Dizziness 04/08/2021   Long term (current) use of antithrombotics/antiplatelets 12/16/2020   Chronic constipation 12/16/2020   Colon cancer screening 12/16/2020   History of adenomatous polyp of colon 12/16/2020   Coronary artery disease of native artery of native heart with stable angina pectoris ([place]) 03/04/2020   Diabetes mellitus ([place]) 03/03/2020   Aortic atherosclerosis ([place]) 02/12/2020   Bilateral leg cramps 01/21/2020   Bilateral lower  extremity edema 01/21/2020   Anxiety disorder 01/21/2020   Type 2 diabetes mellitus with hyperglycemia, with long-term current use of insulin ([place]) 08/15/2019   Type 2 diabetes mellitus with diabetic polyneuropathy, with long-term current use of insulin ([place]) 08/15/2019   Uncontrolled type 2 diabetes mellitus with hypoglycemia without coma ([place]) 08/15/2019   Hyperlipidemia LDL goal <70 08/15/2019   Bilateral carpal tunnel syndrome 03/27/2019   Osteoarthritis of both knees 11/27/2018   CKD (chronic kidney disease),  stage II 11/27/2018   GERD (gastroesophageal reflux disease) 06/26/2018   Homonymous hemianopia, left 02/08/2018   Cerebrovascular accident (CVA) due to embolism of cerebral artery ([place])    Diabetes mellitus with coincident hypertension ([place])    Trigger finger, right index finger 10/17/2017   Low back pain 07/29/2017   Carotid artery stenosis 11/02/2016   Left arm weakness    Diastolic dysfunction    Hypertension with heart disease    Acute blood loss anemia    Cerebral infarction due to embolism of right middle cerebral artery ([place])    Perirectal abscess 09/26/2015   Vitamin D deficiency 07/23/2015   Osteopenia 02/16/2013   Ankle fracture 01/19/2013   Anemia 09/27/2011   Type II diabetes mellitus with nephropathy ([place]) 09/24/2011   Mixed hyperlipidemia 09/24/2011   Primary hypertension 09/24/2011    ONSET DATE: 10/28/2021 referral date (onset date 10/23/21)   REFERRING DIAG: I63.9 (ICD-10-CM) - Cerebrovascular accident (CVA), unspecified mechanism ([place])   MRI brain: Punctate acute infarcts of the left cerebellum and left parietal lobe without hemorrhage or mass effect. Old posterior right MCA territory infarct and punctate old right cerebellar infarct   THERAPY DIAG:  Muscle weakness (generalized)  Unsteadiness on feet  Other lack of coordination  Other symptoms and signs involving cognitive functions following cerebral infarction  Visuospatial deficit  Other abnormalities of gait and mobility  Cognitive communication deficit  Memory loss  Difficulty in walking, not elsewhere classified  Rationale for Evaluation and Treatment Rehabilitation  SUBJECTIVE:   SUBJECTIVE STATEMENT: She has not had lunch yet today. She had some sips of water with Physical Therapy and another drink so far today but was unable to recall what it was. She had also eaten an egg. Pt accompanied by: brother  PERTINENT HISTORY:  The pt is a 76 yo female presenting 10/13 with AMS. Upon work  up, pt with elevated troponin, and MRI showed punctate left cerebellum and left parietal infarcts. PMH includes: DM II, HTN, loop recorder, ORIF right ankle 2014, and CVA x2 (R MCA CVA in 2018 and R parieto-occipital CVA in 2020 w/ residual Lt sided weakness and field cut)  PRECAUTIONS: Other: fall risk, heart monitor, no driving  PAIN:  Are you having pain?  5/10 Rt leg - O.T. not addressing (chronic/pre-existing from arthritis)   FALLS: Has patient fallen in last 6 months? Yes. Number of falls 1  PLOF: Independent and Leisure: puzzles  PATIENT GOALS: work on strength, balance, and vision  OBJECTIVE:   Vitals:   11/24/21 1453 11/24/21 1500  BP: (!) 95/43 (!) 125/57   Initial BP taken at start of visit. After pt reported poor oral intake, second reading was taken after pt had consumed a cracker and a few sips of Ginger Ale.   HAND DOMINANCE: Right  ADLs:  Transfers/ambulation related to ADLs: using rollator Eating: mod I w/ some spills Grooming: mod I  UB Dressing: min assist w/ bra LB Dressing: mod I w/ slip on shoes and elastic pants Toileting: mod I  Bathing: seated sponge bathing w/ set up  Tub Shower transfers: walk in shower w/ shower chair and grab bars but hasn't done since d/c from hospital   IADLs: (however pt did prior to recent stroke)  Shopping: dependent now Light housekeeping: dependent now  Meal Prep: can get a snack, but not cooking Community mobility: relies on family for transportation Medication management: siblings oversees (pillbox, timers on phone)  Financial management: sister performing now (pt was doing)  Handwriting: 90% legible  MOBILITY STATUS:  uses rollator   UPPER EXTREMITY ROM:  BUE AROM WFL's w/ noted weakness end range sh flex deficits LUE (from older strokes) and Lt index DIP flexion (? Mallet finger). Slower to process movement   UPPER EXTREMITY MMT:   RUE MMT grossly 5/5, LUE MMT grossly 4/5  HAND FUNCTION: Grip strength: Right:  30.8 lbs; Left: 18.9 lbs  COORDINATION: 9 Hole Peg test: Right: 46.31 sec; Left: 70.28  sec (difficulty following directions)  Slightly ataxic Lt hand and has difficulty reaching handle of walker (but also some vision and attention deficits to Lt side)   SENSATION: Intact for light touch/localization  EDEMA: none in UE's   COGNITION: Overall cognitive status:  decreased processing speed and short term memory deficits . Difficulty following directions for 9 hole peg test  VISION: Subjective report: vision changes w/ second stroke (Lt homonymous hemianopsia?), however pt reports judging distances and increased bluriness changed w/ this stroke Baseline vision: Wears glasses for reading only Visual history: cataracts  VISION ASSESSMENT: TBA  further in a future session  Patient has difficulty with following activities due to following visual impairments: judging distances, seeing to Lt side for near body and environmental scanning  PERCEPTION: Impaired: Inattention/neglect: does not attend to left visual field and does not attend to left side of body (from 2nd stroke)     TODAY'S TREATMENT:                                                                          - Self-care/home management completed for duration as noted below including: Per subjective and objective measures, OT educated pt and family member on the importance of increasing oral intake especially protein-rich foods, which can help regulate BP, blood sugar, and provide her with the energy she needs to participate in therapy.  Pt given chart for daily BP and blood sugar readings for better health management and to give to her physicians so they have a better idea of her vitals overall. This should also encourage pt to practice handwriting daily.  Pt given lined paper to practice tracing of letters and numbers as well as free text for handwriting improvement.   Session cut short and physical activity deferred given pt  presentation, medical protocols, and safety concerns. Therapist assisted pt to her car to assure safety.   PATIENT EDUCATION: Education details: Physicist, medical, handwriting Person educated: Patient and brother Education method: Explanation, Demonstration, Verbal cues, and Handouts Education comprehension: verbalized understanding, returned demonstration, verbal cues required, and needs further education  HOME EXERCISE PROGRAM: 11/12/2021 - coordination HEP   GOALS: Goals reviewed with patient? Yes  SHORT TERM GOALS: Target date: 12/06/21  Independent w/ coordination HEP w/ focus on Rt hand (from new  stroke) however also Lt hand (modifications prn)  Baseline: Goal status: INITIAL  2.  Pt to improve coordination Rt hand as evidenced by performing 9 hole peg test in 40 sec or less Baseline: 46.31 sec Goal status: INITIAL  3.  Pt to don bra I'ly Baseline: min assist Goal status: INITIAL  4.  Pt to report less spills w/ eating and increase writing legibility to 100% Rt hand Baseline: 90% Goal status: INITIAL  5.  Pt to perform shower transfers and bathing in shower w/ close supervision Baseline:  Goal status: INITIAL  6.  Pt to perform tabletop scanning attending to Lt side w/ no more than min cues Baseline:  Goal status: INITIAL  LONG TERM GOALS: Target date: 02/04/22  Independent with updated HEP for UE strengthening/endurance and putty HEP  Baseline:  Goal status: INITIAL  2.  Pt to perform light meal prep w/ task modifications and A/E prn with supervision Baseline:  Goal status: INITIAL  3.  Pt to perform light IADLS (washing dishes, laundry, folding, making bed) w/ distant supervision and no LOB Baseline:  Goal status: INITIAL  4. Pt to perform environmental scanning with 85% or greater accuracy (attending to Lt side)  Baseline:  Goal status: INITIAL  5.  Pt to perform financial management task with sup/no more than min cues Baseline:  Goal status:  INITIAL  6.  Pt to improve coordination bilaterally as evidenced by performing 9 hole peg test in 35 sec or less Rt hand and under 60 sec Lt hand Baseline: Rt = 46 sec, Lt = 70 sec Goal status: INITIAL  ASSESSMENT:  CLINICAL IMPRESSION: Pt demonstrates poor health management including oral intake that can significantly impair her overall rehab potential. Only light activity completed this session as a precaution and education provided in efforts to improve pt's overall well-being as well as her ability to participate in future therapy sessions.   PERFORMANCE DEFICITS: in functional skills including ADLs, IADLs, coordination, dexterity, proprioception, sensation, strength, Fine motor control, Gross motor control, mobility, balance, body mechanics, endurance, decreased knowledge of precautions, decreased knowledge of use of DME, vision, and UE functional use, cognitive skills including attention, memory, problem solving, and safety awareness, and psychosocial skills including coping strategies.   IMPAIRMENTS: are limiting patient from ADLs, IADLs, leisure, and social participation.   CO-MORBIDITIES; has co-morbidities such as residual deficits from previous strokes  that affects occupational performance. Patient will benefit from skilled OT to address above impairments and improve overall function.  PLAN:  OT FREQUENCY: 2x/week  OT DURATION: 12 weeks plus eval  PLANNED INTERVENTIONS: self care/ADL training, therapeutic exercise, therapeutic activity, neuromuscular re-education, manual therapy, passive range of motion, functional mobility training, aquatic therapy, patient/family education, cognitive remediation/compensation, visual/perceptual remediation/compensation, coping strategies training, and DME and/or AE instructions  RECOMMENDED OTHER SERVICES: none at this time  CONSULTED AND AGREED WITH PLAN OF CARE: Patient and family member/caregiver  PLAN FOR NEXT SESSION: further assess  vision and visual fields, coordination HEP review RUE primarily, LUE chronically    Dennis Bast, OT 11/24/2021, 1:54 PM

## 2021-11-24 NOTE — Therapy (Signed)
OUTPATIENT PHYSICAL THERAPY NEURO TREATMENT   Patient Name: Sarah [name] MRN: [id number] DOB:May 12, 1945, 76 y.o., female Today's Date: 11/24/2021   PCP: [name], [name], MD REFERRING PROVIDER: Modena Jansky, MD    PT End of Session - 11/24/21 1435     Visit Number 4    Number of Visits 17    Date for PT Re-Evaluation 01/14/22    Authorization Type [employer] Medicare    Progress Note Due on Visit 10    PT Start Time 1403    PT Stop Time 1442    PT Time Calculation (min) 39 min    Equipment Utilized During Treatment Gait belt    Activity Tolerance Patient tolerated treatment well    Behavior During Therapy WFL for tasks assessed/performed                Past Medical History:  Diagnosis Date   Bilateral carpal tunnel syndrome 03/27/2019   Boils    Glaucoma    Heart murmur    HTN (hypertension)    Hx of adenomatous colonic polyps    Hyperlipidemia    Osteoporosis    Pneumonia    Stroke ([place])    Type II or unspecified type diabetes mellitus without mention of complication, uncontrolled    Past Surgical History:  Procedure Laterality Date   ABDOMINAL HYSTERECTOMY     CARPAL TUNNEL RELEASE     right   COLONOSCOPY  12-10-10   per Dr. Deatra Ina, clear, repeat in 7 yrs    CORONARY STENT INTERVENTION N/A 03/11/2020   Procedure: CORONARY STENT INTERVENTION;  Surgeon: Jettie Booze, MD;  Location: [facility];  Service: Cardiovascular;  Laterality: N/A;   INCISION AND DRAINAGE PERIRECTAL ABSCESS N/A 09/26/2015   Procedure: IRRIGATION AND DEBRIDEMENT PERIRECTAL ABSCESS;  Surgeon: Mickeal Skinner, MD;  Location: [facility];  Service: General;  Laterality: N/A;   INTRAVASCULAR ULTRASOUND/IVUS N/A 03/11/2020   Procedure: Intravascular Ultrasound/IVUS;  Surgeon: Jettie Booze, MD;  Location: [facility];  Service: Cardiovascular;  Laterality: N/A;   KNEE ARTHROSCOPY     right   LEFT HEART CATH AND CORONARY ANGIOGRAPHY N/A 03/11/2020   Procedure:  LEFT HEART CATH AND CORONARY ANGIOGRAPHY;  Surgeon: Jettie Booze, MD;  Location: [facility];  Service: Cardiovascular;  Laterality: N/A;   LOOP RECORDER INSERTION N/A 10/19/2016   Procedure: LOOP RECORDER INSERTION;  Surgeon: Thompson Grayer, MD;  Location: [facility];  Service: Cardiovascular;  Laterality: N/A;   ORIF ANKLE FRACTURE Right 03/24/2012   Procedure: OPEN REDUCTION INTERNAL FIXATION (ORIF) ANKLE FRACTURE;  Surgeon: Newt Minion, MD;  Location: [place];  Service: Orthopedics;  Laterality: Right;  Open Reduction Internal Fixation Right Bimalleolar ankle fracture   POLYPECTOMY     TEE WITHOUT CARDIOVERSION N/A 10/18/2016   Procedure: TRANSESOPHAGEAL ECHOCARDIOGRAM (TEE);  Surgeon: Fay Records, MD;  Location: [facility];  Service: Cardiovascular;  Laterality: N/A;   TONSILLECTOMY     Patient Active Problem List   Diagnosis Date Noted   Elevated troponin    Change in mental status 10/24/2021   Long term current use of antithrombotics/antiplatelets 10/02/2021   Heart murmur, systolic 62/95/2841   Dizziness 04/08/2021   Long term (current) use of antithrombotics/antiplatelets 12/16/2020   Chronic constipation 12/16/2020   Colon cancer screening 12/16/2020   History of adenomatous polyp of colon 12/16/2020   Coronary artery disease of native artery of native heart with stable angina pectoris ([place]) 03/04/2020  Diabetes mellitus ([place]) 03/03/2020   Aortic atherosclerosis ([place]) 02/12/2020   Bilateral leg cramps 01/21/2020   Bilateral lower extremity edema 01/21/2020   Anxiety disorder 01/21/2020   Type 2 diabetes mellitus with hyperglycemia, with long-term current use of insulin ([place]) 08/15/2019   Type 2 diabetes mellitus with diabetic polyneuropathy, with long-term current use of insulin ([place]) 08/15/2019   Uncontrolled type 2 diabetes mellitus with hypoglycemia without coma ([place]) 08/15/2019   Hyperlipidemia LDL goal <70 08/15/2019   Bilateral carpal tunnel  syndrome 03/27/2019   Osteoarthritis of both knees 11/27/2018   CKD (chronic kidney disease), stage II 11/27/2018   GERD (gastroesophageal reflux disease) 06/26/2018   Homonymous hemianopia, left 02/08/2018   Cerebrovascular accident (CVA) due to embolism of cerebral artery ([place])    Diabetes mellitus with coincident hypertension ([place])    Trigger finger, right index finger 10/17/2017   Low back pain 07/29/2017   Carotid artery stenosis 11/02/2016   Left arm weakness    Diastolic dysfunction    Hypertension with heart disease    Acute blood loss anemia    Cerebral infarction due to embolism of right middle cerebral artery ([place])    Perirectal abscess 09/26/2015   Vitamin D deficiency 07/23/2015   Osteopenia 02/16/2013   Ankle fracture 01/19/2013   Anemia 09/27/2011   Type II diabetes mellitus with nephropathy ([place]) 09/24/2011   Mixed hyperlipidemia 09/24/2011   Primary hypertension 09/24/2011    ONSET DATE: 10/28/2021   REFERRING DIAG: I63.9 (ICD-10-CM) - Cerebrovascular accident (CVA), unspecified mechanism ([place])   THERAPY DIAG:  Muscle weakness (generalized)  Unsteadiness on feet  Other lack of coordination  Rationale for Evaluation and Treatment Rehabilitation  SUBJECTIVE:                                                                                                                                                                                             SUBJECTIVE STATEMENT:  I'm really tired today, the past week has been an adventure. Had to go to the ED, did not get admitted but still tired. RLE hurts. Have had a couple calls from the heart monitoring company they are just still monitoring me. I'm feeling tired and walking is hard.  Pt accompanied by: family member brother   PERTINENT HISTORY: poorly controlled DM 2, hypertension, CVA, proteinuria, CKD 3, and CAD status post DES to RCA in 03/2020; hx of 3 CVAs   PAIN:  Are you having pain? Yes: NPRS scale:  5/10 Pain location: RLE Pain description: Achy/throbbing Relieving factors: medicines, capscasin  Aggravating factors: not sure, hurts in sleep and wakes her up   VITALS There  were no vitals filed for this visit.     PRECAUTIONS: Fall  WEIGHT BEARING RESTRICTIONS: No  FALLS: Has patient fallen in last 6 months? Yes. Number of falls 1, fell bringing garbage cans in, RLE started hurting after that fall  LIVING ENVIRONMENT: Lives with: lives with their family; lives with mother and nephew, sister lives close by; rotating siblings Lives in: House/apartment Stairs: Yes: Internal: 12 steps; on right going up and External: 1 steps; none Has following equipment at home: Single point cane, Walker - 2 wheeled, and shower chair  PLOF: Independent with gait and Independent with transfers  PATIENT GOALS: "walking, vision"  OBJECTIVE:   DIAGNOSTIC FINDINGS:  brain MRI 10/24/21: IMPRESSION: 1. Punctate acute infarcts of the left cerebellum and left parietal lobe. No hemorrhage or mass effect. 2. Old posterior right MCA territory infarct and punctate old right cerebellar infarct.  Head angiogram 10/24/21: IMPRESSION: 1. No emergent finding. Accounting for motion artifact, no convincing change from 2021. 2. Bilateral M1 segment stenosis, particularly advanced on the right where there has been prior MCA territory infarct.  COGNITION: Overall cognitive status: Impaired; may have had memory deficits prior to this CVA   SENSATION: Reports N/T in RLE up to thigh but light touch appears intact Proprioception dec in distal LLE  TODAY'S TREATMENT  11/24/21  132/63 HR 104, manually confirmed at 102BPM at rest   TherEx  Standing marches x20 BUE support  Standing heel-toe raises alternating x20 BUE support  Forward stepping to targets U UE support Mod cues for step length  Cross midline taps to colored targets U UE support  Side stepping along mat table x3 rounds  Small bouts of  backwards walking during session  Square stepping x3 rounds each direction- started to look "woozy" and eyes rolled back in head, got her into rollator and BP was 115/63 HR 109, 111/59 HR 106BPM   PATIENT EDUCATION: Education details: Initial HEP Person educated: Patient and sister-in-law Sarah  Education method: Explanation, Demonstration, Tactile cues, Verbal cues, and Handouts Education comprehension: verbalized understanding  HOME EXERCISE PROGRAM: Access Code: [email or web address] URL: [email or web address] Date: 11/12/2021 Prepared by: Mickie Bail Plaster  Exercises - Sit to Stand Without Arm Support  - 1 x daily - 7 x weekly - 3 sets - 10 reps - Standing March with Counter Support  - 1 x daily - 7 x weekly - 3 sets - 10 reps - Heel Toe Raises with Counter Support  - 1 x daily - 7 x weekly - 3 sets - 10 reps   GOALS: Goals reviewed with patient? Yes  SHORT TERM GOALS: Target date: 12/03/2021  Pt will be independent with initial HEP for improved strength, balance, transfers and gait. Baseline: Goal status: INITIAL  2.  Pt will improve gait velocity to at least 1.5 ft/sec for improved gait efficiency and performance at S* level  Baseline: 1.19 ft/sec with rollator and S* (10/26) Goal status: INITIAL  3.  Pt will improve normal TUG to less than or equal to 45 seconds for improved functional mobility and decreased fall risk. Baseline: 59.52 sec with rollator Goal status: INITIAL  4.  Pt will improve Berg score to 28/56 for decreased fall risk  Baseline: 23/56 Goal status: REVISED   LONG TERM GOALS: Target date: 12/31/2021  Pt will be independent with final HEP for improved strength, balance, transfers and gait. Baseline:  Goal status: INITIAL  2.  Pt will improve gait velocity to at least 2.0 ft/sec for improved gait efficiency and  performance at S* level  Baseline: 1.19 ft/sec with S* and rollator (10/26) Goal status: INITIAL  3.  Pt will improve normal TUG to  less than or equal to 30 seconds for improved functional mobility and decreased fall risk. Baseline: 59.52 sec with rollator Goal status: INITIAL  4.  Pt will improve Berg score to 34/56 for decreased fall risk  Baseline: 23/56 Goal status: REVISED   ASSESSMENT:  CLINICAL IMPRESSION:  Deniya arrives today feeling more tired than usual- had a lot going on medically over the past week between ED visit and NSVT/VT warnings for her heart. Worked on strength and balance as tolerated today, also incorporated small bouts of backwards gait to further challenge balance and coordination. Will continue efforts. HR slightly elevated today but did not exceed 111BPM with regular pattern. During Square stepping x3 rounds each direction- started to look "woozy" and eyes rolled back in head, got her into rollator and BP was 115/63 HR 109, 111/59 HR 106BPM 3  minutes later. Encouraged them to bring her phone to therapy so we know if monitoring company calls with any events.   OBJECTIVE IMPAIRMENTS: Abnormal gait, cardiopulmonary status limiting activity, decreased activity tolerance, decreased balance, decreased cognition, decreased coordination, decreased endurance, decreased knowledge of condition, decreased knowledge of use of DME, decreased mobility, difficulty walking, decreased ROM, decreased strength, decreased safety awareness, impaired sensation, and impaired vision/preception.   ACTIVITY LIMITATIONS: carrying, lifting, bending, squatting, stairs, transfers, and bed mobility  PARTICIPATION LIMITATIONS: driving and community activity  PERSONAL FACTORS: Age and 1-2 comorbidities:    poorly controlled DM 2, hypertension, CVA, proteinuria, CKD 3, and CAD status post DES to RCA in 03/2020 are also affecting patient's functional outcome.   REHAB POTENTIAL: Good  CLINICAL DECISION MAKING: Stable/uncomplicated  EVALUATION COMPLEXITY: Moderate  PLAN:  PT FREQUENCY: 2x/week  PT DURATION: 8  weeks  PLANNED INTERVENTIONS: Therapeutic exercises, Therapeutic activity, Neuromuscular re-education, Balance training, Gait training, Patient/Family education, Self Care, Joint mobilization, Stair training, Vestibular training, Canalith repositioning, Visual/preceptual remediation/compensation, Orthotic/Fit training, DME instructions, Aquatic Therapy, Dry Needling, Cognitive remediation, Electrical stimulation, Cryotherapy, Moist heat, Taping, Manual therapy, and Re-evaluation  PLAN FOR NEXT SESSION: add to HEP prn, work on safe rollator use with transfers and brake management, work on bed mobility from elevated surface (have sister measure bed height), increasing step length and LE clearance with gait, turns, reaching out of BOS watch BPS   Trenden Hazelrigg U PT DPT PN2  11/24/2021, 2:49 PM

## 2021-11-25 ENCOUNTER — Other Ambulatory Visit: Payer: Self-pay | Admitting: Family Medicine

## 2021-11-25 DIAGNOSIS — M79604 Pain in right leg: Secondary | ICD-10-CM

## 2021-11-26 ENCOUNTER — Encounter: Payer: Self-pay | Admitting: Physical Therapy

## 2021-11-26 ENCOUNTER — Ambulatory Visit: Payer: Medicare Other | Admitting: Physical Therapy

## 2021-11-26 ENCOUNTER — Encounter: Payer: Self-pay | Admitting: Occupational Therapy

## 2021-11-26 ENCOUNTER — Ambulatory Visit: Payer: Medicare Other | Admitting: Occupational Therapy

## 2021-11-26 ENCOUNTER — Encounter: Payer: Self-pay | Admitting: Neurology

## 2021-11-26 VITALS — BP 135/60 | HR 92

## 2021-11-26 DIAGNOSIS — R2689 Other abnormalities of gait and mobility: Secondary | ICD-10-CM

## 2021-11-26 DIAGNOSIS — R262 Difficulty in walking, not elsewhere classified: Secondary | ICD-10-CM | POA: Diagnosis not present

## 2021-11-26 DIAGNOSIS — R41842 Visuospatial deficit: Secondary | ICD-10-CM

## 2021-11-26 DIAGNOSIS — R2681 Unsteadiness on feet: Secondary | ICD-10-CM

## 2021-11-26 DIAGNOSIS — I69318 Other symptoms and signs involving cognitive functions following cerebral infarction: Secondary | ICD-10-CM

## 2021-11-26 DIAGNOSIS — R413 Other amnesia: Secondary | ICD-10-CM | POA: Diagnosis not present

## 2021-11-26 DIAGNOSIS — M6281 Muscle weakness (generalized): Secondary | ICD-10-CM

## 2021-11-26 DIAGNOSIS — R278 Other lack of coordination: Secondary | ICD-10-CM | POA: Diagnosis not present

## 2021-11-26 DIAGNOSIS — R293 Abnormal posture: Secondary | ICD-10-CM

## 2021-11-26 DIAGNOSIS — R41841 Cognitive communication deficit: Secondary | ICD-10-CM

## 2021-11-26 NOTE — Therapy (Signed)
OUTPATIENT OCCUPATIONAL THERAPY NEURO TREATMENT  Patient Name: Sarah [name] MRN: [id number] DOB:07/27/1945, 76 y.o., female Today's Date: 11/26/2021  PCP: [name], [name], MD  REFERRING PROVIDER: PCP: [name], [name], MD    OT End of Session - 11/26/21 1411     Visit Number 4    Number of Visits 25    Date for OT Re-Evaluation 02/04/22    Authorization Type [employer] MCR    OT Start Time 4481    OT Stop Time 1448    OT Time Calculation (min) 43 min    Activity Tolerance Patient tolerated treatment well    Behavior During Therapy [facility] for tasks assessed/performed              Past Medical History:  Diagnosis Date   Bilateral carpal tunnel syndrome 03/27/2019   Boils    Glaucoma    Heart murmur    HTN (hypertension)    Hx of adenomatous colonic polyps    Hyperlipidemia    Osteoporosis    Pneumonia    Stroke ([place])    Type II or unspecified type diabetes mellitus without mention of complication, uncontrolled    Past Surgical History:  Procedure Laterality Date   ABDOMINAL HYSTERECTOMY     CARPAL TUNNEL RELEASE     right   COLONOSCOPY  12-10-10   per Dr. Deatra Ina, clear, repeat in 7 yrs    CORONARY STENT INTERVENTION N/A 03/11/2020   Procedure: CORONARY STENT INTERVENTION;  Surgeon: Jettie Booze, MD;  Location: [place] [facility];  Service: Cardiovascular;  Laterality: N/A;   INCISION AND DRAINAGE PERIRECTAL ABSCESS N/A 09/26/2015   Procedure: IRRIGATION AND DEBRIDEMENT PERIRECTAL ABSCESS;  Surgeon: Mickeal Skinner, MD;  Location: [place];  Service: General;  Laterality: N/A;   INTRAVASCULAR ULTRASOUND/IVUS N/A 03/11/2020   Procedure: Intravascular Ultrasound/IVUS;  Surgeon: Jettie Booze, MD;  Location: [place] [facility];  Service: Cardiovascular;  Laterality: N/A;   KNEE ARTHROSCOPY     right   LEFT HEART CATH AND CORONARY ANGIOGRAPHY N/A 03/11/2020   Procedure: LEFT HEART CATH AND CORONARY ANGIOGRAPHY;  Surgeon: Jettie Booze, MD;  Location:  [place] [facility];  Service: Cardiovascular;  Laterality: N/A;   LOOP RECORDER INSERTION N/A 10/19/2016   Procedure: LOOP RECORDER INSERTION;  Surgeon: Thompson Grayer, MD;  Location: [place] [facility];  Service: Cardiovascular;  Laterality: N/A;   ORIF ANKLE FRACTURE Right 03/24/2012   Procedure: OPEN REDUCTION INTERNAL FIXATION (ORIF) ANKLE FRACTURE;  Surgeon: Newt Minion, MD;  Location: [place];  Service: Orthopedics;  Laterality: Right;  Open Reduction Internal Fixation Right Bimalleolar ankle fracture   POLYPECTOMY     TEE WITHOUT CARDIOVERSION N/A 10/18/2016   Procedure: TRANSESOPHAGEAL ECHOCARDIOGRAM (TEE);  Surgeon: Fay Records, MD;  Location: [facility];  Service: Cardiovascular;  Laterality: N/A;   TONSILLECTOMY     Patient Active Problem List   Diagnosis Date Noted   Elevated troponin    Change in mental status 10/24/2021   Long term current use of antithrombotics/antiplatelets 10/02/2021   Heart murmur, systolic 85/63/1497   Dizziness 04/08/2021   Long term (current) use of antithrombotics/antiplatelets 12/16/2020   Chronic constipation 12/16/2020   Colon cancer screening 12/16/2020   History of adenomatous polyp of colon 12/16/2020   Coronary artery disease of native artery of native heart with stable angina pectoris ([place]) 03/04/2020   Diabetes mellitus ([place]) 03/03/2020   Aortic atherosclerosis ([place]) 02/12/2020   Bilateral leg cramps 01/21/2020   Bilateral  lower extremity edema 01/21/2020   Anxiety disorder 01/21/2020   Type 2 diabetes mellitus with hyperglycemia, with long-term current use of insulin ([place]) 08/15/2019   Type 2 diabetes mellitus with diabetic polyneuropathy, with long-term current use of insulin ([place]) 08/15/2019   Uncontrolled type 2 diabetes mellitus with hypoglycemia without coma ([place]) 08/15/2019   Hyperlipidemia LDL goal <70 08/15/2019   Bilateral carpal tunnel syndrome 03/27/2019   Osteoarthritis of both knees 11/27/2018   CKD (chronic kidney  disease), stage II 11/27/2018   GERD (gastroesophageal reflux disease) 06/26/2018   Homonymous hemianopia, left 02/08/2018   Cerebrovascular accident (CVA) due to embolism of cerebral artery ([place])    Diabetes mellitus with coincident hypertension ([place])    Trigger finger, right index finger 10/17/2017   Low back pain 07/29/2017   Carotid artery stenosis 11/02/2016   Left arm weakness    Diastolic dysfunction    Hypertension with heart disease    Acute blood loss anemia    Cerebral infarction due to embolism of right middle cerebral artery ([place])    Perirectal abscess 09/26/2015   Vitamin D deficiency 07/23/2015   Osteopenia 02/16/2013   Ankle fracture 01/19/2013   Anemia 09/27/2011   Type II diabetes mellitus with nephropathy ([place]) 09/24/2011   Mixed hyperlipidemia 09/24/2011   Primary hypertension 09/24/2011    ONSET DATE: 10/28/2021 referral date (onset date 10/23/21)   REFERRING DIAG: I63.9 (ICD-10-CM) - Cerebrovascular accident (CVA), unspecified mechanism ([place])   MRI brain: Punctate acute infarcts of the left cerebellum and left parietal lobe without hemorrhage or mass effect. Old posterior right MCA territory infarct and punctate old right cerebellar infarct   THERAPY DIAG:  Muscle weakness (generalized)  Unsteadiness on feet  Other lack of coordination  Other symptoms and signs involving cognitive functions following cerebral infarction  Visuospatial deficit  Other abnormalities of gait and mobility  Cognitive communication deficit  Abnormal posture  Memory loss  Difficulty in walking, not elsewhere classified  Rationale for Evaluation and Treatment Rehabilitation  SUBJECTIVE:   SUBJECTIVE STATEMENT: She ate an English muffin and a Ginger Ale today. Her sister-in-law states she hasn't had an appetite and did not finish either item. Her S-I-L also states the pt has not been doing anything at home. The pt reports she has been super fatigued for the past day+.    Pt accompanied by:  SISTER -in-law  PERTINENT HISTORY:  The pt is a 76 yo female presenting 10/13 with AMS. Upon work up, pt with elevated troponin, and MRI showed punctate left cerebellum and left parietal infarcts. PMH includes: DM II, HTN, loop recorder, ORIF right ankle 2014, and CVA x2 (R MCA CVA in 2018 and R parieto-occipital CVA in 2020 w/ residual Lt sided weakness and field cut)  PRECAUTIONS: Other: fall risk, heart monitor, no driving  PAIN:  Are you having pain?  5/10 Rt leg - O.T. not addressing (chronic/pre-existing from arthritis)   FALLS: Has patient fallen in last 6 months? Yes. Number of falls 1  PLOF: Independent and Leisure: puzzles  PATIENT GOALS: work on strength, balance, and vision  OBJECTIVE:   There were no vitals filed for this visit.    HAND DOMINANCE: Right  ADLs:  Transfers/ambulation related to ADLs: using rollator Eating: mod I w/ some spills Grooming: mod I  UB Dressing: min assist w/ bra LB Dressing: mod I w/ slip on shoes and elastic pants Toileting: mod I  Bathing: seated sponge bathing w/ set up  Tub Shower transfers: walk in shower  w/ shower chair and grab bars but hasn't done since d/c from hospital   IADLs: (however pt did prior to recent stroke)  Shopping: dependent now Light housekeeping: dependent now  Meal Prep: can get a snack, but not cooking Community mobility: relies on family for transportation Medication management: siblings oversees (pillbox, timers on phone)  Financial management: sister performing now (pt was doing)  Handwriting: 90% legible  MOBILITY STATUS:  uses rollator   UPPER EXTREMITY ROM:  BUE AROM WFL's w/ noted weakness end range sh flex deficits LUE (from older strokes) and Lt index DIP flexion (? Mallet finger). Slower to process movement   UPPER EXTREMITY MMT:   RUE MMT grossly 5/5, LUE MMT grossly 4/5  HAND FUNCTION: Grip strength: Right: 30.8 lbs; Left: 18.9 lbs  COORDINATION: 9 Hole Peg  test: Right: 46.31 sec; Left: 70.28  sec (difficulty following directions)  Slightly ataxic Lt hand and has difficulty reaching handle of walker (but also some vision and attention deficits to Lt side)   SENSATION: Intact for light touch/localization  EDEMA: none in UE's   COGNITION: Overall cognitive status:  decreased processing speed and short term memory deficits . Difficulty following directions for 9 hole peg test  VISION: Subjective report: vision changes w/ second stroke (Lt homonymous hemianopsia?), however pt reports judging distances and increased bluriness changed w/ this stroke Baseline vision: Wears glasses for reading only Visual history: cataracts  VISION ASSESSMENT: TBA  further in a future session  Patient has difficulty with following activities due to following visual impairments: judging distances, seeing to Lt side for near body and environmental scanning  PERCEPTION: Impaired: Inattention/neglect: does not attend to left visual field and does not attend to left side of body (from 2nd stroke)     TODAY'S TREATMENT:                                                                          - Therapeutic activities completed for duration as noted below including: Using LUE, pt completed 1 visual puzzle with wooden, color-coded pieces requiring increased time. Then pt completed a puzzle using the RUE though asked for help and wanted to end session due to fatigue. Pt willing to proceed with item manipulation as instructed by therapist. Activities completed for visual scanning, fine motor coordination, and problem solving.  Pt obtained desired coins and bills from table for 3 amounts as determined by the therapist for improved visual scanning, fine motor coordination, and processing. Pt required increased time and cueing for completion.   PATIENT EDUCATION: Education details: Artist Person educated: Patient and SISTER -in-law Education method: Explanation,  Demonstration, Verbal cues, and Handouts Education comprehension: verbalized understanding, returned demonstration, verbal cues required, and needs further education  HOME EXERCISE PROGRAM: 11/12/2021 - coordination HEP   GOALS: Goals reviewed with patient? Yes  SHORT TERM GOALS: Target date: 12/06/21  Independent w/ coordination HEP w/ focus on Rt hand (from new stroke) however also Lt hand (modifications prn)  Baseline: Goal status: INITIAL  2.  Pt to improve coordination Rt hand as evidenced by performing 9 hole peg test in 40 sec or less Baseline: 46.31 sec Goal status: INITIAL  3.  Pt to don bra I'ly Baseline: min  assist Goal status: INITIAL  4.  Pt to report less spills w/ eating and increase writing legibility to 100% Rt hand Baseline: 90% Goal status: INITIAL  5.  Pt to perform shower transfers and bathing in shower w/ close supervision Baseline:  Goal status: INITIAL  6.  Pt to perform tabletop scanning attending to Lt side w/ no more than min cues Baseline:  Goal status: INITIAL  LONG TERM GOALS: Target date: 02/04/22  Independent with updated HEP for UE strengthening/endurance and putty HEP  Baseline:  Goal status: INITIAL  2.  Pt to perform light meal prep w/ task modifications and A/E prn with supervision Baseline:  Goal status: INITIAL  3.  Pt to perform light IADLS (washing dishes, laundry, folding, making bed) w/ distant supervision and no LOB Baseline:  Goal status: INITIAL  4. Pt to perform environmental scanning with 85% or greater accuracy (attending to Lt side)  Baseline:  Goal status: INITIAL  5.  Pt to perform financial management task with sup/no more than min cues Baseline:  Goal status: INITIAL  6.  Pt to improve coordination bilaterally as evidenced by performing 9 hole peg test in 35 sec or less Rt hand and under 60 sec Lt hand Baseline: Rt = 46 sec, Lt = 70 sec Goal status: INITIAL  ASSESSMENT:  CLINICAL IMPRESSION: Pt  demonstrates BUE fine motor coordination impairments (L>R), issues with memory, and visuospatial deficits. Poor endurance with tasks due to reported fatigue; however, would assume this is at least in part due to poor intake and lack of motivation. Pt remained willing to continue with therapy and reported understanding of need to participate as part of rehabilitation.   PERFORMANCE DEFICITS: in functional skills including ADLs, IADLs, coordination, dexterity, proprioception, sensation, strength, Fine motor control, Gross motor control, mobility, balance, body mechanics, endurance, decreased knowledge of precautions, decreased knowledge of use of DME, vision, and UE functional use, cognitive skills including attention, memory, problem solving, and safety awareness, and psychosocial skills including coping strategies.   IMPAIRMENTS: are limiting patient from ADLs, IADLs, leisure, and social participation.   CO-MORBIDITIES; has co-morbidities such as residual deficits from previous strokes  that affects occupational performance. Patient will benefit from skilled OT to address above impairments and improve overall function.  PLAN:  OT FREQUENCY: 2x/week  OT DURATION: 12 weeks plus eval  PLANNED INTERVENTIONS: self care/ADL training, therapeutic exercise, therapeutic activity, neuromuscular re-education, manual therapy, passive range of motion, functional mobility training, aquatic therapy, patient/family education, cognitive remediation/compensation, visual/perceptual remediation/compensation, coping strategies training, and DME and/or AE instructions  RECOMMENDED OTHER SERVICES: none at this time  CONSULTED AND AGREED WITH PLAN OF CARE: Patient and family member/caregiver  PLAN FOR NEXT SESSION: further assess vision and visual fields, coordination HEP review RUE primarily, LUE chronically    Dennis Bast, OT 11/26/2021, 3:22 PM

## 2021-11-26 NOTE — Therapy (Signed)
OUTPATIENT PHYSICAL THERAPY NEURO TREATMENT   Patient Name: Sarah [name] MRN: [id number] DOB:[birth year], 76 y.o., female Today's Date: 11/26/2021   PCP: [name], [name], MD REFERRING PROVIDER: Modena Jansky, MD    PT End of Session - 11/26/21 1324     Visit Number 5    Number of Visits 17    Date for PT Re-Evaluation 01/14/22    Authorization Type [employer] Medicare    Progress Note Due on Visit 10    PT Start Time 1316    PT Stop Time 1403    PT Time Calculation (min) 47 min    Equipment Utilized During Treatment Gait belt    Activity Tolerance Patient tolerated treatment well    Behavior During Therapy WFL for tasks assessed/performed                Past Medical History:  Diagnosis Date   Bilateral carpal tunnel syndrome 03/27/2019   Boils    Glaucoma    Heart murmur    HTN (hypertension)    Hx of adenomatous colonic polyps    Hyperlipidemia    Osteoporosis    Pneumonia    Stroke ([place])    Type II or unspecified type diabetes mellitus without mention of complication, uncontrolled    Past Surgical History:  Procedure Laterality Date   ABDOMINAL HYSTERECTOMY     CARPAL TUNNEL RELEASE     right   COLONOSCOPY  12-10-10   per Dr. Deatra Ina, clear, repeat in 7 yrs    CORONARY STENT INTERVENTION N/A 03/11/2020   Procedure: CORONARY STENT INTERVENTION;  Surgeon: Jettie Booze, MD;  Location: [facility];  Service: Cardiovascular;  Laterality: N/A;   INCISION AND DRAINAGE PERIRECTAL ABSCESS N/A 09/26/2015   Procedure: IRRIGATION AND DEBRIDEMENT PERIRECTAL ABSCESS;  Surgeon: Mickeal Skinner, MD;  Location: [facility];  Service: General;  Laterality: N/A;   INTRAVASCULAR ULTRASOUND/IVUS N/A 03/11/2020   Procedure: Intravascular Ultrasound/IVUS;  Surgeon: Jettie Booze, MD;  Location: [facility];  Service: Cardiovascular;  Laterality: N/A;   KNEE ARTHROSCOPY     right   LEFT HEART CATH AND CORONARY ANGIOGRAPHY N/A 03/11/2020   Procedure:  LEFT HEART CATH AND CORONARY ANGIOGRAPHY;  Surgeon: Jettie Booze, MD;  Location: [facility];  Service: Cardiovascular;  Laterality: N/A;   LOOP RECORDER INSERTION N/A 10/19/2016   Procedure: LOOP RECORDER INSERTION;  Surgeon: Thompson Grayer, MD;  Location: [facility];  Service: Cardiovascular;  Laterality: N/A;   ORIF ANKLE FRACTURE Right 03/24/2012   Procedure: OPEN REDUCTION INTERNAL FIXATION (ORIF) ANKLE FRACTURE;  Surgeon: Newt Minion, MD;  Location: [facility];  Service: Orthopedics;  Laterality: Right;  Open Reduction Internal Fixation Right Bimalleolar ankle fracture   POLYPECTOMY     TEE WITHOUT CARDIOVERSION N/A 10/18/2016   Procedure: TRANSESOPHAGEAL ECHOCARDIOGRAM (TEE);  Surgeon: Fay Records, MD;  Location: [facility];  Service: Cardiovascular;  Laterality: N/A;   TONSILLECTOMY     Patient Active Problem List   Diagnosis Date Noted   Elevated troponin    Change in mental status 10/24/2021   Long term current use of antithrombotics/antiplatelets 10/02/2021   Heart murmur, systolic 50/09/3816   Dizziness 04/08/2021   Long term (current) use of antithrombotics/antiplatelets 12/16/2020   Chronic constipation 12/16/2020   Colon cancer screening 12/16/2020   History of adenomatous polyp of colon 12/16/2020   Coronary artery disease of native artery of native heart with stable angina pectoris ([place]) 03/04/2020  Diabetes mellitus ([place]) 03/03/2020   Aortic atherosclerosis ([place]) 02/12/2020   Bilateral leg cramps 01/21/2020   Bilateral lower extremity edema 01/21/2020   Anxiety disorder 01/21/2020   Type 2 diabetes mellitus with hyperglycemia, with long-term current use of insulin ([place]) 08/15/2019   Type 2 diabetes mellitus with diabetic polyneuropathy, with long-term current use of insulin ([place]) 08/15/2019   Uncontrolled type 2 diabetes mellitus with hypoglycemia without coma ([place]) 08/15/2019   Hyperlipidemia LDL goal <70 08/15/2019   Bilateral carpal tunnel  syndrome 03/27/2019   Osteoarthritis of both knees 11/27/2018   CKD (chronic kidney disease), stage II 11/27/2018   GERD (gastroesophageal reflux disease) 06/26/2018   Homonymous hemianopia, left 02/08/2018   Cerebrovascular accident (CVA) due to embolism of cerebral artery ([place])    Diabetes mellitus with coincident hypertension ([place])    Trigger finger, right index finger 10/17/2017   Low back pain 07/29/2017   Carotid artery stenosis 11/02/2016   Left arm weakness    Diastolic dysfunction    Hypertension with heart disease    Acute blood loss anemia    Cerebral infarction due to embolism of right middle cerebral artery ([place])    Perirectal abscess 09/26/2015   Vitamin D deficiency 07/23/2015   Osteopenia 02/16/2013   Ankle fracture 01/19/2013   Anemia 09/27/2011   Type II diabetes mellitus with nephropathy ([place]) 09/24/2011   Mixed hyperlipidemia 09/24/2011   Primary hypertension 09/24/2011    ONSET DATE: 10/28/2021   REFERRING DIAG: I63.9 (ICD-10-CM) - Cerebrovascular accident (CVA), unspecified mechanism ([place])   THERAPY DIAG:  Muscle weakness (generalized)  Unsteadiness on feet  Other lack of coordination  Other abnormalities of gait and mobility  Rationale for Evaluation and Treatment Rehabilitation  SUBJECTIVE:                                                                                                                                                                                             SUBJECTIVE STATEMENT:  "I'm really tired today"  Pt states her RLE continues to bother her.  It has hurt consistently since this morning.  Pt accompanied by: family member brother   PERTINENT HISTORY: poorly controlled DM 2, hypertension, CVA, proteinuria, CKD 3, and CAD status post DES to RCA in 03/2020; hx of 3 CVAs   PAIN:  Are you having pain? Yes: NPRS scale: 7/10 Pain location: RLE Pain description: Achy/throbbing Relieving factors: medicines, capscasin   Aggravating factors: not sure, hurts in sleep and wakes her up  VITALS Vitals:   11/26/21 1322  BP: 135/60  Pulse: 92   PRECAUTIONS: Fall  WEIGHT BEARING RESTRICTIONS: No  FALLS: Has  patient fallen in last 6 months? Yes. Number of falls 1, fell bringing garbage cans in, RLE started hurting after that fall  LIVING ENVIRONMENT: Lives with: lives with their family; lives with mother and nephew, sister lives close by; rotating siblings Lives in: House/apartment Stairs: Yes: Internal: 12 steps; on right going up and External: 1 steps; none Has following equipment at home: Single point cane, Walker - 2 wheeled, and shower chair  PLOF: Independent with gait and Independent with transfers  PATIENT GOALS: "walking, vision"  OBJECTIVE:   DIAGNOSTIC FINDINGS:  brain MRI 10/24/21: IMPRESSION: 1. Punctate acute infarcts of the left cerebellum and left parietal lobe. No hemorrhage or mass effect. 2. Old posterior right MCA territory infarct and punctate old right cerebellar infarct.  Head angiogram 10/24/21: IMPRESSION: 1. No emergent finding. Accounting for motion artifact, no convincing change from 2021. 2. Bilateral M1 segment stenosis, particularly advanced on the right where there has been prior MCA territory infarct.  COGNITION: Overall cognitive status: Impaired; may have had memory deficits prior to this CVA   SENSATION: Reports N/T in RLE up to thigh but light touch appears intact Proprioception dec in distal LLE  TODAY'S TREATMENT -Pt transitions from sitting EOM to supine.  Practices roll to left and right x1 independently w/ inc time to engage to task.   -Supine bridges w/ mini ball between knees x12 -Side-lying clamshells x12 each side -Pt returns to sitting EOM from hook-lying independently w/ cuing for log roll technique vs pushing straight up from supine -PT sets up elevated mat surface w/ 6" step using rollator handle as bedrail at EOM to simulate home setup,  pt performs climb in and scoots into bed using forearms primarily despite PT cuing to promote use of LE to crawl into and position in bed.  PT provides inc time and cuing to promote safely reaching EOM as pt positions herself at angle in bed.  Pt able to use log roll from prior to reach EOM w/ PT providing minA to manage LE off EOM around handle of rollator.  Cued for scooting to have feet touch step using rollator handle and therapist handheld assist to descend step setup. -Pt demonstrates how she gets in bed at home w/ sister-in-law [place] explaining setup.  They are pursuing steps for her to use w/ bed rail to enter/exit bed.  She currently uses bed rail and back rollator up to bed, steps on edge of boxspring to then elevate alternate foot onto seat of rollator and scoot backwards onto bed.  She does the reverse to exit.  SIL explains that she often does this when no one is there to help.  Edu on alternate bed setups to shorten height of bed and general safety entering/exiting bed.  GAIT: Gait pattern: step through pattern, shuffling, narrow BOS, poor foot clearance- Right, and poor foot clearance- Left Distance walked: 115' Assistive device utilized: Environmental consultant - 4 wheeled Level of assistance: SBA and CGA Comments: Pt distractible throughout ambulation intermittently squeezing brakes requiring frequent cuing to correct management of AD.  Cues for approximation to rollator, step size, and foot clearance.  PATIENT EDUCATION: Education details: Continue HEP Person educated: Patient and sister-in-law Engineer, drilling: Explanation, Demonstration, Corporate treasurer cues, Verbal cues, and Handouts Education comprehension: verbalized understanding  HOME EXERCISE PROGRAM: Access Code: [email or web address] URL: [email or web address] Date: 11/12/2021 Prepared by: Mickie Bail Plaster  Exercises - Sit to Stand Without Arm Support  - 1 x daily - 7 x weekly - 3 sets - 10 reps -  Standing March with Counter Support   - 1 x daily - 7 x weekly - 3 sets - 10 reps - Heel Toe Raises with Counter Support  - 1 x daily - 7 x weekly - 3 sets - 10 reps   GOALS: Goals reviewed with patient? Yes  SHORT TERM GOALS: Target date: 12/03/2021  Pt will be independent with initial HEP for improved strength, balance, transfers and gait. Baseline: Goal status: INITIAL  2.  Pt will improve gait velocity to at least 1.5 ft/sec for improved gait efficiency and performance at S* level  Baseline: 1.19 ft/sec with rollator and S* (10/26) Goal status: INITIAL  3.  Pt will improve normal TUG to less than or equal to 45 seconds for improved functional mobility and decreased fall risk. Baseline: 59.52 sec with rollator Goal status: INITIAL  4.  Pt will improve Berg score to 28/56 for decreased fall risk  Baseline: 23/56 Goal status: REVISED   LONG TERM GOALS: Target date: 12/31/2021  Pt will be independent with final HEP for improved strength, balance, transfers and gait. Baseline:  Goal status: INITIAL  2.  Pt will improve gait velocity to at least 2.0 ft/sec for improved gait efficiency and performance at S* level  Baseline: 1.19 ft/sec with S* and rollator (10/26) Goal status: INITIAL  3.  Pt will improve normal TUG to less than or equal to 30 seconds for improved functional mobility and decreased fall risk. Baseline: 59.52 sec with rollator Goal status: INITIAL  4.  Pt will improve Berg score to 34/56 for decreased fall risk  Baseline: 23/56 Goal status: REVISED   ASSESSMENT:  CLINICAL IMPRESSION: Emphasis of skilled PT session today on functional mobility in bed with pt requiring inc time and cues for improved performance, but otherwise independent with all physical aspects.  Simulated home setup with pt and sister-in-law to problem solve entering and exiting elevated bed surface with improved safety.  Will continue to address pt deficits outlined in ongoing POC.  OBJECTIVE IMPAIRMENTS: Abnormal gait,  cardiopulmonary status limiting activity, decreased activity tolerance, decreased balance, decreased cognition, decreased coordination, decreased endurance, decreased knowledge of condition, decreased knowledge of use of DME, decreased mobility, difficulty walking, decreased ROM, decreased strength, decreased safety awareness, impaired sensation, and impaired vision/preception.   ACTIVITY LIMITATIONS: carrying, lifting, bending, squatting, stairs, transfers, and bed mobility  PARTICIPATION LIMITATIONS: driving and community activity  PERSONAL FACTORS: Age and 1-2 comorbidities:    poorly controlled DM 2, hypertension, CVA, proteinuria, CKD 3, and CAD status post DES to RCA in 03/2020 are also affecting patient's functional outcome.   REHAB POTENTIAL: Good  CLINICAL DECISION MAKING: Stable/uncomplicated  EVALUATION COMPLEXITY: Moderate  PLAN:  PT FREQUENCY: 2x/week  PT DURATION: 8 weeks  PLANNED INTERVENTIONS: Therapeutic exercises, Therapeutic activity, Neuromuscular re-education, Balance training, Gait training, Patient/Family education, Self Care, Joint mobilization, Stair training, Vestibular training, Canalith repositioning, Visual/preceptual remediation/compensation, Orthotic/Fit training, DME instructions, Aquatic Therapy, Dry Needling, Cognitive remediation, Electrical stimulation, Cryotherapy, Moist heat, Taping, Manual therapy, and Re-evaluation  PLAN FOR NEXT SESSION: add to HEP prn, work on safe rollator use with transfers and brake management, work on bed mobility from elevated surface (have sister measure bed height)-did they get steps w/ rail for bed or remove boxspring?, increasing step length and LE clearance with gait, turns, reaching out of BOS watch [place], PT, DPT 11/26/2021, 5:31 PM

## 2021-11-27 ENCOUNTER — Telehealth: Payer: Self-pay

## 2021-11-27 ENCOUNTER — Ambulatory Visit (HOSPITAL_COMMUNITY)
Admission: RE | Admit: 2021-11-27 | Discharge: 2021-11-27 | Disposition: A | Discharge status: Home / Self Care | Payer: Medicare Other | Source: Ambulatory Visit | Attending: Family Medicine | Admitting: Family Medicine

## 2021-11-27 DIAGNOSIS — M79604 Pain in right leg: Secondary | ICD-10-CM | POA: Diagnosis not present

## 2021-11-27 NOTE — Telephone Encounter (Signed)
[name] patient sister called in stating that patient doesn't have any appetite and has not been eating. Wants to know iff she is on to much Victoza.    [phone]

## 2021-11-27 NOTE — Telephone Encounter (Signed)
Patient sister [name] advised and verbalized understanding

## 2021-11-30 ENCOUNTER — Encounter: Payer: Self-pay | Admitting: Licensed Clinical Social Worker

## 2021-11-30 ENCOUNTER — Ambulatory Visit: Payer: Self-pay | Admitting: *Deleted

## 2021-11-30 NOTE — Patient Instructions (Signed)
Visit Information  Thank you for taking time to visit with me today. Please don't hesitate to contact me if I can be of assistance to you.   How to Apply for Aid & Attendance [place] form [phone]  Following are the goals we discussed today:   Goals Addressed               This Visit's Progress     Patient Stated     Confirm and schedule nephrology post hospital visit [facility]) (pt-stated)   Not on track     Care Coordination Interventions: Assessed the sister, Sarah [name] understanding of chronic kidney disease    Evaluation of current treatment plan related to chronic kidney disease self management and patient's adherence to plan as established by provider      Reviewed scheduled/upcoming provider appointments including    Discussed plans with patient for ongoing care management follow up and provided patient with direct contact information for care management team    Confirm further calls to Sarah [name]' s office by Sarah [name] x 2 to speak with Sarah [name] about a post hospital office visit without success. Outreach to Sarah [name] to request advise.  Outreach to pcp Sarah [name] who reports it would fine  to wait until 12/30/21 for the nephrology visit, her renal function seems to be stable.       manage/reconcilliation of medications ([facility]) (pt-stated)   On track     Care Coordination Interventions: Reviewed medications with patient and discussed importance of medication adherence Discussed plans with patient for ongoing care management follow up and provided patient with direct contact information for care management team Confirmed patient received assistance with refill prescriptions victoza and atorvastin to 40 mg 2 tabs vs 1 large 80 mg tab    to assist with noted swallowing difficulties of the 80 mg tablet Confirms she continues with difficulty swallowing the 40 mg 2 tabs        Our next appointment is by telephone on 12/30/21 at 1100  Please call the care guide team at  [phone] if you need to cancel or reschedule your appointment.   If you are experiencing a Mental Health or Pass Christian or need someone to talk to, please call the Suicide and Crisis Lifeline: 988 call the [place] National Suicide Prevention Lifeline: 3657421260 or TTY: 423-151-9748 TTY 7546924121) to talk to a trained counselor call 1-800-273-TALK (toll free, 24 hour hotline) go to [facility] [address], [place] [phone]) call 911   Patient verbalizes understanding of instructions and care plan provided today and agrees to view in [place]. Active MyChart status and patient understanding of how to access instructions and care plan via MyChart confirmed with patient.     The patient has been provided with contact information for the care management team and has been advised to call with any health related questions or concerns.  The [facility] team will follow up with the patient and will provide direct communication to the PCP for this patient.   Sarah [name] L. Sarah Hamman, RN, BSN, [place] Coordinator Office number [phone]

## 2021-11-30 NOTE — Patient Outreach (Signed)
Care Coordination   Follow Up Visit Note   11/30/2021 Name: Sarah [name] MRN: [id number] DOB: 03-Aug-1945  Sarah [name] is a 76 y.o. year old female who sees Sarah [name], Sarah So, MD for primary care. I spoke with sister, Sarah [name] of Sarah [name] by phone today.  What matters to the patients health and wellness today?  Follow up nephrologist, Recent constipation, low energy  Nephrologist never returned a call Sarah [name] as stated. Sarah [name] called back after a pcp visit & spoke with Sarah [name] x 2. Sarah [name] informed Sarah [name] Sarah [name] reviewed the patient's labs and 12/30/21 scheduled visit would remain the same (not changing visit to a post hospital visit, to be seen earlier)  [facility] PT sessions -missed after Sarah [name] had days of weakness and constipation  Miralax did help to resolve her constipation   Decreased appetite/medication swallowing concerns victoza  was decreased from 1.8 to 1.2  Her appetite has returned but still poor Weight 115 lbs  She does have boost Recommend Glucerna She does not like ensure Pill swallowing caught in throat ? Liquid or crush Lipitor is one   Has a heart monitor that continues to being monitored by [facility] heart care staff 11/23/21 patient called and was noted to have ventricular tachycardia. Sarah [name] was assessed and found to be asymptomatic- Reviewed with Sarah [name] Next cardiology visit on 12/08/21 with Sarah [name]   Level of care status Pending change of level of care for Sarah [name] and her mother  Sarah [name] has no VA or Long term Care insurance but her mother does have VA through spouse (although they divorced) Sarah [name] will review how to Apply for [name] [id number]   Goals Addressed               This Visit's Progress     Patient Stated     Confirm and schedule nephrology post hospital visit [facility]) (pt-stated)   Not on track     Care Coordination Interventions: Assessed the sister, Sarah [name]  understanding of chronic kidney disease    Evaluation of current treatment plan related to chronic kidney disease self management and patient's adherence to plan as established by provider      Reviewed scheduled/upcoming provider appointments including    Discussed plans with patient for ongoing care management follow up and provided patient with direct contact information for care management team    Confirm further calls to Sarah [name]' s office by Sarah [name] x 2 to speak with Sarah [name] about a post hospital office visit without success. Outreach to Sarah Sarah [name] to request advise.        manage/reconcilliation of medications ([facility]) (pt-stated)   On track     Care Coordination Interventions: Reviewed medications with patient and discussed importance of medication adherence Discussed plans with patient for ongoing care management follow up and provided patient with direct contact information for care management team Confirmed patient received assistance with refill prescriptions victoza and atorvastin to 40 mg 2 tabs vs 1 large 80 mg tab    to assist with noted swallowing difficulties of the 80 mg tablet Confirms she continues with difficulty swallowing the 40 mg 2 tabs        SDOH assessments and interventions completed:  No     Care Coordination Interventions Activated:  Yes  Care Coordination Interventions:  Yes, provided   Follow up plan: Follow up call scheduled for 12/30/21 1100    Encounter Outcome:  Pt. Visit Completed  Sarah [name] L. Sarah Hamman, RN, BSN, [place] Coordinator Office number [phone]

## 2021-12-02 ENCOUNTER — Ambulatory Visit: Payer: Self-pay | Admitting: Licensed Clinical Social Worker

## 2021-12-02 ENCOUNTER — Telehealth: Payer: Self-pay | Admitting: Licensed Clinical Social Worker

## 2021-12-02 NOTE — Patient Outreach (Signed)
  Care Coordination   12/02/2021 Name: Sarah [name] MRN: [id number] DOB: 05-19-1945   Care Coordination Outreach Attempts:  An unsuccessful telephone outreach was attempted for a scheduled appointment today.  Follow Up Plan:  Additional outreach attempts will be made to offer the patient care coordination information and services.   Encounter Outcome:  No Answer   Care Coordination Interventions:  No, not indicated    Christa See, MSW, [email or web address] Phone [phone] 1:40 PM

## 2021-12-07 ENCOUNTER — Telehealth: Payer: Self-pay | Admitting: Family Medicine

## 2021-12-07 NOTE — Telephone Encounter (Signed)
Pt's sister called to say Pt had a stroke in October 2023, and is wondering if it would be alright for Pt to receive the Covid vax??  Please advise. [phone]

## 2021-12-08 ENCOUNTER — Telehealth: Payer: Self-pay | Admitting: Pharmacist

## 2021-12-08 ENCOUNTER — Ambulatory Visit: Payer: Medicare Other | Admitting: Physical Therapy

## 2021-12-08 ENCOUNTER — Encounter: Payer: Self-pay | Admitting: Physician Assistant

## 2021-12-08 ENCOUNTER — Telehealth: Payer: Self-pay | Admitting: Physical Therapy

## 2021-12-08 ENCOUNTER — Ambulatory Visit: Payer: Medicare Other | Admitting: Occupational Therapy

## 2021-12-08 ENCOUNTER — Ambulatory Visit: Payer: Medicare Other | Attending: Physician Assistant | Admitting: Physician Assistant

## 2021-12-08 VITALS — BP 132/60 | HR 91

## 2021-12-08 VITALS — BP 138/68 | HR 97 | Ht 60.0 in | Wt 108.2 lb

## 2021-12-08 DIAGNOSIS — R262 Difficulty in walking, not elsewhere classified: Secondary | ICD-10-CM | POA: Diagnosis not present

## 2021-12-08 DIAGNOSIS — R293 Abnormal posture: Secondary | ICD-10-CM

## 2021-12-08 DIAGNOSIS — M6281 Muscle weakness (generalized): Secondary | ICD-10-CM

## 2021-12-08 DIAGNOSIS — R2681 Unsteadiness on feet: Secondary | ICD-10-CM

## 2021-12-08 DIAGNOSIS — R41841 Cognitive communication deficit: Secondary | ICD-10-CM

## 2021-12-08 DIAGNOSIS — R627 Adult failure to thrive: Secondary | ICD-10-CM | POA: Diagnosis not present

## 2021-12-08 DIAGNOSIS — I69318 Other symptoms and signs involving cognitive functions following cerebral infarction: Secondary | ICD-10-CM | POA: Diagnosis not present

## 2021-12-08 DIAGNOSIS — R278 Other lack of coordination: Secondary | ICD-10-CM | POA: Diagnosis not present

## 2021-12-08 DIAGNOSIS — I2510 Atherosclerotic heart disease of native coronary artery without angina pectoris: Secondary | ICD-10-CM

## 2021-12-08 DIAGNOSIS — I10 Essential (primary) hypertension: Secondary | ICD-10-CM | POA: Diagnosis not present

## 2021-12-08 DIAGNOSIS — R7989 Other specified abnormal findings of blood chemistry: Secondary | ICD-10-CM | POA: Diagnosis not present

## 2021-12-08 DIAGNOSIS — R2689 Other abnormalities of gait and mobility: Secondary | ICD-10-CM

## 2021-12-08 DIAGNOSIS — R413 Other amnesia: Secondary | ICD-10-CM | POA: Diagnosis not present

## 2021-12-08 DIAGNOSIS — I6340 Cerebral infarction due to embolism of unspecified cerebral artery: Secondary | ICD-10-CM | POA: Diagnosis not present

## 2021-12-08 DIAGNOSIS — R41842 Visuospatial deficit: Secondary | ICD-10-CM

## 2021-12-08 MED ORDER — FUROSEMIDE 20 MG PO TABS: ORAL_TABLET | ORAL | 3 refills | Status: AC

## 2021-12-08 NOTE — Therapy (Signed)
OUTPATIENT OCCUPATIONAL THERAPY NEURO DISCHARGE  Patient Name: Sarah [name] MRN: [id number] DOB:April 16, 1945, 76 y.o., female Today's Date: 12/08/2021  PCP: [name], [name], MD  REFERRING PROVIDER: PCP: [name], [name], MD   OCCUPATIONAL THERAPY DISCHARGE SUMMARY  Visits from Start of Care: 5  Current functional level related to goals / functional outcomes: This pt has been limited by medical complications being unable to tolerate OP therapy services. No significant change in functional status at time of evaluation.    Remaining deficits: No significant progress made.    Education / Equipment: Continue with therapy recommendations as able to follow up with [facility] for further remediation efforts.    Patient agrees to discharge. Patient goals were not met. Patient is being discharged due to  pt is discharging to home health..     OT End of Session - 12/08/21 1538     Visit Number 5    Number of Visits 25    Date for OT Re-Evaluation 02/04/22    Authorization Type [employer] MCR    OT Start Time 1536    OT Stop Time 9147    OT Time Calculation (min) 38 min    Activity Tolerance Patient tolerated treatment well    Behavior During Therapy WFL for tasks assessed/performed              Past Medical History:  Diagnosis Date   Bilateral carpal tunnel syndrome 03/27/2019   Boils    Glaucoma    Heart murmur    HTN (hypertension)    Hx of adenomatous colonic polyps    Hyperlipidemia    Osteoporosis    Pneumonia    Stroke ([place])    Type II or unspecified type diabetes mellitus without mention of complication, uncontrolled    Past Surgical History:  Procedure Laterality Date   ABDOMINAL HYSTERECTOMY     CARPAL TUNNEL RELEASE     right   COLONOSCOPY  12-10-10   per Dr. Deatra Ina, clear, repeat in 7 yrs    CORONARY STENT INTERVENTION N/A 03/11/2020   Procedure: CORONARY STENT INTERVENTION;  Surgeon: Jettie Booze, MD;  Location: [facility];  Service:  Cardiovascular;  Laterality: N/A;   INCISION AND DRAINAGE PERIRECTAL ABSCESS N/A 09/26/2015   Procedure: IRRIGATION AND DEBRIDEMENT PERIRECTAL ABSCESS;  Surgeon: Mickeal Skinner, MD;  Location: [facility];  Service: General;  Laterality: N/A;   INTRAVASCULAR ULTRASOUND/IVUS N/A 03/11/2020   Procedure: Intravascular Ultrasound/IVUS;  Surgeon: Jettie Booze, MD;  Location: [facility];  Service: Cardiovascular;  Laterality: N/A;   KNEE ARTHROSCOPY     right   LEFT HEART CATH AND CORONARY ANGIOGRAPHY N/A 03/11/2020   Procedure: LEFT HEART CATH AND CORONARY ANGIOGRAPHY;  Surgeon: Jettie Booze, MD;  Location: [facility];  Service: Cardiovascular;  Laterality: N/A;   LOOP RECORDER INSERTION N/A 10/19/2016   Procedure: LOOP RECORDER INSERTION;  Surgeon: Thompson Grayer, MD;  Location: [facility];  Service: Cardiovascular;  Laterality: N/A;   ORIF ANKLE FRACTURE Right 03/24/2012   Procedure: OPEN REDUCTION INTERNAL FIXATION (ORIF) ANKLE FRACTURE;  Surgeon: Newt Minion, MD;  Location: [facility];  Service: Orthopedics;  Laterality: Right;  Open Reduction Internal Fixation Right Bimalleolar ankle fracture   POLYPECTOMY     TEE WITHOUT CARDIOVERSION N/A 10/18/2016   Procedure: TRANSESOPHAGEAL ECHOCARDIOGRAM (TEE);  Surgeon: Fay Records, MD;  Location: [facility];  Service: Cardiovascular;  Laterality: N/A;   TONSILLECTOMY     Patient Active Problem  List   Diagnosis Date Noted   Elevated troponin    Change in mental status 10/24/2021   Long term current use of antithrombotics/antiplatelets 10/02/2021   Heart murmur, systolic 96/22/2979   Dizziness 04/08/2021   Long term (current) use of antithrombotics/antiplatelets 12/16/2020   Chronic constipation 12/16/2020   Colon cancer screening 12/16/2020   History of adenomatous polyp of colon 12/16/2020   Coronary artery disease of native artery of native heart with stable angina pectoris ([place]) 03/04/2020   Diabetes mellitus  ([place]) 03/03/2020   Aortic atherosclerosis ([place]) 02/12/2020   Bilateral leg cramps 01/21/2020   Bilateral lower extremity edema 01/21/2020   Anxiety disorder 01/21/2020   Type 2 diabetes mellitus with hyperglycemia, with long-term current use of insulin ([place]) 08/15/2019   Type 2 diabetes mellitus with diabetic polyneuropathy, with long-term current use of insulin ([place]) 08/15/2019   Uncontrolled type 2 diabetes mellitus with hypoglycemia without coma ([place]) 08/15/2019   Hyperlipidemia LDL goal <70 08/15/2019   Bilateral carpal tunnel syndrome 03/27/2019   Osteoarthritis of both knees 11/27/2018   CKD (chronic kidney disease), stage II 11/27/2018   GERD (gastroesophageal reflux disease) 06/26/2018   Homonymous hemianopia, left 02/08/2018   Cerebrovascular accident (CVA) due to embolism of cerebral artery ([place])    Diabetes mellitus with coincident hypertension ([place])    Trigger finger, right index finger 10/17/2017   Low back pain 07/29/2017   Carotid artery stenosis 11/02/2016   Left arm weakness    Diastolic dysfunction    Hypertension with heart disease    Acute blood loss anemia    Cerebral infarction due to embolism of right middle cerebral artery ([place])    Perirectal abscess 09/26/2015   Vitamin D deficiency 07/23/2015   Osteopenia 02/16/2013   Ankle fracture 01/19/2013   Anemia 09/27/2011   Type II diabetes mellitus with nephropathy ([place]) 09/24/2011   Mixed hyperlipidemia 09/24/2011   Primary hypertension 09/24/2011    ONSET DATE: 10/28/2021 referral date (onset date 10/23/21)   REFERRING DIAG: I63.9 (ICD-10-CM) - Cerebrovascular accident (CVA), unspecified mechanism ([place])   MRI brain: Punctate acute infarcts of the left cerebellum and left parietal lobe without hemorrhage or mass effect. Old posterior right MCA territory infarct and punctate old right cerebellar infarct   THERAPY DIAG:  Muscle weakness (generalized)  Unsteadiness on feet  Other abnormalities of gait and  mobility  Visuospatial deficit  Other symptoms and signs involving cognitive functions following cerebral infarction  Other lack of coordination  Cognitive communication deficit  Abnormal posture  Memory loss  Difficulty in walking, not elsewhere classified  Rationale for Evaluation and Treatment Rehabilitation  SUBJECTIVE:   SUBJECTIVE STATEMENT: She ate a piece of toast, an apple, water, and a sugar free juice today. Her sister states she hasn't had the energy to complete many functional activities and is fatigued from her doctor's visit earlier today.The pt has not been completing HEP as instructed. They ask about therapy for swallowing.   Pt accompanied by:  SISTER  PERTINENT HISTORY:  The pt is a 76 yo female presenting 10/13 with AMS. Upon work up, pt with elevated troponin, and MRI showed punctate left cerebellum and left parietal infarcts. PMH includes: DM II, HTN, loop recorder, ORIF right ankle 2014, and CVA x2 (R MCA CVA in 2018 and R parieto-occipital CVA in 2020 w/ residual Lt sided weakness and field cut)  PRECAUTIONS: Other: fall risk, heart monitor, no driving  PAIN:  Are you having pain? No  FALLS: Has patient fallen in last  6 months? Yes. Number of falls 1  PLOF: Independent and Leisure: puzzles  PATIENT GOALS: work on strength, balance, and vision  OBJECTIVE: (from evaluation unless otherwise noted)    HAND DOMINANCE: Right  ADLs:  Transfers/ambulation related to ADLs: using rollator Eating: mod I w/ some spills Grooming: mod I  UB Dressing: min assist w/ bra LB Dressing: mod I w/ slip on shoes and elastic pants Toileting: mod I  Bathing: seated sponge bathing w/ set up  Tub Shower transfers: walk in shower w/ shower chair and grab bars but hasn't done since d/c from hospital   IADLs: (however pt did prior to recent stroke)  Shopping: dependent now Light housekeeping: dependent now  Meal Prep: can get a snack, but not cooking Community  mobility: relies on family for transportation Medication management: siblings oversees (pillbox, timers on phone)  Financial management: sister performing now (pt was doing)  Handwriting: 90% legible  MOBILITY STATUS:  uses rollator   UPPER EXTREMITY ROM:  BUE AROM WFL's w/ noted weakness end range sh flex deficits LUE (from older strokes) and Lt index DIP flexion (? Mallet finger). Slower to process movement   UPPER EXTREMITY MMT:   RUE MMT grossly 5/5, LUE MMT grossly 4/5  HAND FUNCTION: Grip strength: Right: 30.8 lbs; Left: 18.9 lbs  COORDINATION: 9 Hole Peg test: Right: 46.31 sec; Left: 70.28  sec (difficulty following directions)  Slightly ataxic Lt hand and has difficulty reaching handle of walker (but also some vision and attention deficits to Lt side)   SENSATION: Intact for light touch/localization  EDEMA: none in UE's   COGNITION: Overall cognitive status:  decreased processing speed and short term memory deficits . Difficulty following directions for 9 hole peg test  VISION: Subjective report: vision changes w/ second stroke (Lt homonymous hemianopsia?), however pt reports judging distances and increased bluriness changed w/ this stroke Baseline vision: Wears glasses for reading only Visual history: cataracts  VISION ASSESSMENT: TBA  further in a future session  Patient has difficulty with following activities due to following visual impairments: judging distances, seeing to Lt side for near body and environmental scanning  PERCEPTION: Impaired: Inattention/neglect: does not attend to left visual field and does not attend to left side of body (from 2nd stroke)     TODAY'S TREATMENT:                                                                          - Self-care/home management completed for duration as noted below including: Therapist reviewed goals with patient and updated patient progression.  No additional functional limitations identified. Reviewed  HEP and recommendations for therapy following OP OT d/c  PATIENT EDUCATION: Education details: OT d/c Person educated: Patient and SISTER Education method: Explanation, Demonstration, Verbal cues, and Handouts Education comprehension: verbalized understanding  HOME EXERCISE PROGRAM: 11/12/2021 - coordination HEP   GOALS: Goals reviewed with patient? Yes  SHORT TERM GOALS: Target date: 12/06/21  Independent w/ coordination HEP w/ focus on Rt hand (from new stroke) however also Lt hand (modifications prn)  Baseline: Goal status: NOT MET  2.  Pt to improve coordination Rt hand as evidenced by performing 9 hole peg test in 40 sec or less Baseline: 46.31  sec Goal status: NOT MET - 11/28 - 44.74 seconds  3.  Pt to don bra I'ly Baseline: min assist Goal status: NOT MET  4.  Pt to report less spills w/ eating and increase writing legibility to 100% Rt hand Baseline: 90% Goal status: NOT MET - no spills reported but no change to handwriting  5.  Pt to perform shower transfers and bathing in shower w/ close supervision Baseline:  Goal status: NOT MET - 11/28 - requires same level of assistance  6.  Pt to perform tabletop scanning attending to Lt side w/ no more than min cues Baseline:  Goal status: NOT MET  LONG TERM GOALS: Target date: 02/04/22  Independent with updated HEP for UE strengthening/endurance and putty HEP  Baseline:  Goal status: NOT MET  2.  Pt to perform light meal prep w/ task modifications and A/E prn with supervision Baseline:  Goal status: NOT MET - pt has not attempted  3.  Pt to perform light IADLS (washing dishes, laundry, folding, making bed) w/ distant supervision and no LOB Baseline:  Goal status: NOT MET - pt has not attempted  4. Pt to perform environmental scanning with 85% or greater accuracy (attending to Lt side)  Baseline:  Goal status: NOT MET  5.  Pt to perform financial management task with sup/no more than min cues Baseline:  Goal  status: NOT MET  6.  Pt to improve coordination bilaterally as evidenced by performing 9 hole peg test in 35 sec or less Rt hand and under 60 sec Lt hand Baseline: Rt = 46 sec, Lt = 70 sec Goal status: NOT MET - 11/28- R: 44.74 seconds; L: 68.25 seconds  ASSESSMENT:  CLINICAL IMPRESSION: Pt demonstrates difficulty with tolerating OP therapy services as needed to progress towards goals. This is further complicated by her medical co-morbidities and lack of intake both liquid and solids.   PERFORMANCE DEFICITS: in functional skills including ADLs, IADLs, coordination, dexterity, proprioception, sensation, strength, Fine motor control, Gross motor control, mobility, balance, body mechanics, endurance, decreased knowledge of precautions, decreased knowledge of use of DME, vision, and UE functional use, cognitive skills including attention, memory, problem solving, and safety awareness, and psychosocial skills including coping strategies.   IMPAIRMENTS: are limiting patient from ADLs, IADLs, leisure, and social participation.   CO-MORBIDITIES; has co-morbidities such as residual deficits from previous strokes  that affects occupational performance. Patient will benefit from skilled OT to address above impairments and improve overall function.  PLAN:   OT d/c completed. Recommend d/c to [facility], PT, and ST for further remediation efforts.   Dennis Bast, OT 12/08/2021, 5:42 PM

## 2021-12-08 NOTE — Telephone Encounter (Signed)
Spoke with patient. Advised all medications could be crushed. Advised that furosemide and Wilder Glade should be in the morning. Will use apple sauce or pudding.  Patient voiced understanding.

## 2021-12-08 NOTE — Telephone Encounter (Signed)
Dr. [name],  Sarah [name] is currently been seen by Physical Therapy in the outpatient setting at [facility]. She would benefit more from home health therapy services at this time and would therefore benefit from an order for home health PT, OT, and Speech Therapy. If you agree, please place an appropriate order for this. Thank you, Excell Seltzer, PT, DPT, [facility] [address] [place], [place]  [address] Phone:  [phone] Fax:  [phone]

## 2021-12-08 NOTE — Patient Instructions (Signed)
Activities to try at home to encourage visual scanning:   1. Word searches 2. Mazes 3. Puzzles 4. Card games 5. Computer games and/or searches 6. Connect-the-dots  Activities for environmental (larger) scanning:  1. With supervision, scan for items in grocery store or drugstore.  Begin with a familiar store, then progress to a new store you've never been in before. Make sure you have supervision with this.  2. With supervision, tell a family member or caregiver when it is safe to cross a street after looking all directions and any side streets. However, do NOT cross street unless family member or caregiver is with you and says it is OK   - Speak with home health Speech Therapist for recommendations on swallowing.   - Occupational Therapy to address safety with completion of home tasks (transfers, dishes, light meal prep, etc.) AND arm strengthening.

## 2021-12-08 NOTE — Telephone Encounter (Signed)
-----  Message from [place], [place] K sent at 12/08/2021  1:45 PM EST ----- Regarding: crushing medications Hey guys! :) So I met this sweet lady during clinic today with Sarah [name]. She has just recently suffered from a stroke and is having difficulty swallowing tablets. I spoke to her and her sister who accompanied her to the visit today about purchasing a pill crusher. I glanced at her list and I believe the only thing that likely shouldn't be crushed is the [place]. Will you guys double check that for me? We need to call the sister listed on the DPR [name] to inform her of this. She can be reached at the number on file anytime today or Thursday (unavailable Wednesday). Much appreciated

## 2021-12-08 NOTE — Telephone Encounter (Signed)
Hx of thrombotic events is not a contraindication to vaccination. Given her co-morbilities, the benefits outweighs the risk, so I recommend it. Thanks, BJ

## 2021-12-08 NOTE — Telephone Encounter (Signed)
I left patient's sister a voicemail with the information below & advised her to call back with any questions.

## 2021-12-08 NOTE — Therapy (Signed)
OUTPATIENT PHYSICAL THERAPY NEURO TREATMENT-DISCHARGE NOTE   Patient Name: Sarah [name] MRN: [id number] DOB:[birth year], 76 y.o., female Today's Date: 12/08/2021   PCP: [name], [name], MD REFERRING PROVIDER: Modena Jansky, MD   PHYSICAL THERAPY DISCHARGE SUMMARY  Visits from Start of Care: 6  Current functional level related to goals / functional outcomes: Mod I with rollator   Remaining deficits: Decreased gait speed, decreased functional LE strength, decreased balance, decreased endurance, increased fall risk   Education / Equipment: Handout for HEP, continue use of rollator   Patient agrees to discharge. Patient goals were partially met. Patient is being discharged due to  wishes to start home health therapy services.    PT End of Session - 12/08/21 1455     Visit Number 6    Number of Visits 17    Date for PT Re-Evaluation 01/14/22    Authorization Type [employer] Medicare    Progress Note Due on Visit 10    PT Start Time 1451   ran over with previous patient   PT Stop Time 1532    PT Time Calculation (min) 41 min    Equipment Utilized During Treatment Gait belt    Activity Tolerance Patient tolerated treatment well    Behavior During Therapy WFL for tasks assessed/performed                 Past Medical History:  Diagnosis Date   Bilateral carpal tunnel syndrome 03/27/2019   Boils    Glaucoma    Heart murmur    HTN (hypertension)    Hx of adenomatous colonic polyps    Hyperlipidemia    Osteoporosis    Pneumonia    Stroke ([place])    Type II or unspecified type diabetes mellitus without mention of complication, uncontrolled    Past Surgical History:  Procedure Laterality Date   ABDOMINAL HYSTERECTOMY     CARPAL TUNNEL RELEASE     right   COLONOSCOPY  12-10-10   per Dr. Deatra Ina, clear, repeat in 7 yrs    CORONARY STENT INTERVENTION N/A 03/11/2020   Procedure: CORONARY STENT INTERVENTION;  Surgeon: Jettie Booze, MD;  Location: [facility];  Service: Cardiovascular;  Laterality: N/A;   INCISION AND DRAINAGE PERIRECTAL ABSCESS N/A 09/26/2015   Procedure: IRRIGATION AND DEBRIDEMENT PERIRECTAL ABSCESS;  Surgeon: Mickeal Skinner, MD;  Location: [facility];  Service: General;  Laterality: N/A;   INTRAVASCULAR ULTRASOUND/IVUS N/A 03/11/2020   Procedure: Intravascular Ultrasound/IVUS;  Surgeon: Jettie Booze, MD;  Location: [facility];  Service: Cardiovascular;  Laterality: N/A;   KNEE ARTHROSCOPY     right   LEFT HEART CATH AND CORONARY ANGIOGRAPHY N/A 03/11/2020   Procedure: LEFT HEART CATH AND CORONARY ANGIOGRAPHY;  Surgeon: Jettie Booze, MD;  Location: [facility];  Service: Cardiovascular;  Laterality: N/A;   LOOP RECORDER INSERTION N/A 10/19/2016   Procedure: LOOP RECORDER INSERTION;  Surgeon: Thompson Grayer, MD;  Location: [facility];  Service: Cardiovascular;  Laterality: N/A;   ORIF ANKLE FRACTURE Right 03/24/2012   Procedure: OPEN REDUCTION INTERNAL FIXATION (ORIF) ANKLE FRACTURE;  Surgeon: Newt Minion, MD;  Location: [facility];  Service: Orthopedics;  Laterality: Right;  Open Reduction Internal Fixation Right Bimalleolar ankle fracture   POLYPECTOMY     TEE WITHOUT CARDIOVERSION N/A 10/18/2016   Procedure: TRANSESOPHAGEAL ECHOCARDIOGRAM (TEE);  Surgeon: Fay Records, MD;  Location: [facility];  Service: Cardiovascular;  Laterality: N/A;   TONSILLECTOMY  Patient Active Problem List   Diagnosis Date Noted   Elevated troponin    Change in mental status 10/24/2021   Long term current use of antithrombotics/antiplatelets 10/02/2021   Heart murmur, systolic 73/41/9379   Dizziness 04/08/2021   Long term (current) use of antithrombotics/antiplatelets 12/16/2020   Chronic constipation 12/16/2020   Colon cancer screening 12/16/2020   History of adenomatous polyp of colon 12/16/2020   Coronary artery disease of native artery of native heart with stable angina pectoris ([place])  03/04/2020   Diabetes mellitus ([place]) 03/03/2020   Aortic atherosclerosis ([place]) 02/12/2020   Bilateral leg cramps 01/21/2020   Bilateral lower extremity edema 01/21/2020   Anxiety disorder 01/21/2020   Type 2 diabetes mellitus with hyperglycemia, with long-term current use of insulin ([place]) 08/15/2019   Type 2 diabetes mellitus with diabetic polyneuropathy, with long-term current use of insulin ([place]) 08/15/2019   Uncontrolled type 2 diabetes mellitus with hypoglycemia without coma ([place]) 08/15/2019   Hyperlipidemia LDL goal <70 08/15/2019   Bilateral carpal tunnel syndrome 03/27/2019   Osteoarthritis of both knees 11/27/2018   CKD (chronic kidney disease), stage II 11/27/2018   GERD (gastroesophageal reflux disease) 06/26/2018   Homonymous hemianopia, left 02/08/2018   Cerebrovascular accident (CVA) due to embolism of cerebral artery ([place])    Diabetes mellitus with coincident hypertension ([place])    Trigger finger, right index finger 10/17/2017   Low back pain 07/29/2017   Carotid artery stenosis 11/02/2016   Left arm weakness    Diastolic dysfunction    Hypertension with heart disease    Acute blood loss anemia    Cerebral infarction due to embolism of right middle cerebral artery ([place])    Perirectal abscess 09/26/2015   Vitamin D deficiency 07/23/2015   Osteopenia 02/16/2013   Ankle fracture 01/19/2013   Anemia 09/27/2011   Type II diabetes mellitus with nephropathy ([place]) 09/24/2011   Mixed hyperlipidemia 09/24/2011   Primary hypertension 09/24/2011    ONSET DATE: 10/28/2021   REFERRING DIAG: I63.9 (ICD-10-CM) - Cerebrovascular accident (CVA), unspecified mechanism ([place])   THERAPY DIAG:  Muscle weakness (generalized)  Unsteadiness on feet  Other abnormalities of gait and mobility  Rationale for Evaluation and Treatment Rehabilitation  SUBJECTIVE:                                                                                                                                                                                              SUBJECTIVE STATEMENT: Pt's sister reports she has had a couple of falls since last visit, family has to help her get back up. Pt reports she is feeling weak and does not have  much energy. Pt coming from her cardiology appointment today and is worn out from that. Pt's sister does report they now have a bedrail on the bed and a stool at the bedside that pt has been using to be more independent with bed mobility.   Pt accompanied by: family member sister [name]  PERTINENT HISTORY: poorly controlled DM 2, hypertension, CVA, proteinuria, CKD 3, and CAD status post DES to RCA in 03/2020; hx of 3 CVAs   PAIN:  Are you having pain? No  VITALS Vitals:   12/08/21 1502  BP: 132/60  Pulse: 91    PRECAUTIONS: Fall  WEIGHT BEARING RESTRICTIONS: No  FALLS: Has patient fallen in last 6 months? Yes. Number of falls 1, fell bringing garbage cans in, RLE started hurting after that fall  LIVING ENVIRONMENT: Lives with: lives with their family; lives with mother and nephew, sister lives close by; rotating siblings Lives in: House/apartment Stairs: Yes: Internal: 12 steps; on right going up and External: 1 steps; none Has following equipment at home: Single point cane, Walker - 2 wheeled, and shower chair  PLOF: Independent with gait and Independent with transfers  PATIENT GOALS: "walking, vision"  OBJECTIVE:   DIAGNOSTIC FINDINGS:  brain MRI 10/24/21: IMPRESSION: 1. Punctate acute infarcts of the left cerebellum and left parietal lobe. No hemorrhage or mass effect. 2. Old posterior right MCA territory infarct and punctate old right cerebellar infarct.  Head angiogram 10/24/21: IMPRESSION: 1. No emergent finding. Accounting for motion artifact, no convincing change from 2021. 2. Bilateral M1 segment stenosis, particularly advanced on the right where there has been prior MCA territory infarct.  COGNITION: Overall cognitive  status: Impaired; may have had memory deficits prior to this CVA   SENSATION: Reports N/T in RLE up to thigh but light touch appears intact Proprioception dec in distal LLE  TODAY'S TREATMENT  GOAL ASSESSMENT: THER ACT:  [facility] PT Assessment - 12/08/21 1508       Ambulation/Gait   Gait velocity 32.8 ft over 34.56 sec = 0.95 ft/sec      Standardized Balance Assessment   Standardized Balance Assessment Timed Up and Go Test;Berg Balance Test      Berg Balance Test   Sit to Stand Able to stand without using hands and stabilize independently    Standing Unsupported Able to stand 2 minutes with supervision    Sitting with Back Unsupported but Feet Supported on Floor or Stool Able to sit safely and securely 2 minutes    Stand to Sit Sits safely with minimal use of hands    Transfers Needs one person to assist    Standing Unsupported with Eyes Closed Able to stand 10 seconds with supervision    Standing Unsupported with Feet Together Needs help to attain position but able to stand for 30 seconds with feet together    From Standing, Reach Forward with Outstretched Arm Reaches forward but needs supervision    From Standing Position, Pick up Object from Floor Unable to try/needs assist to keep balance    From Standing Position, Turn to Look Behind Over each Shoulder Turn sideways only but maintains balance    Turn 360 Degrees Needs assistance while turning    Standing Unsupported, Alternately Place Feet on Step/Stool Able to complete >2 steps/needs minimal assist    Standing Unsupported, One Foot in Front Able to take small step independently and hold 30 seconds    Standing on One Leg Able to lift leg independently and hold equal to or  more than 3 seconds    Total Score 28    Berg comment: high fall risk      Timed Up and Go Test   TUG Normal TUG    Normal TUG (seconds) 43.6   with rollator           Discussed with patient and her sister transitioning from outpatient therapy services  to home health therapy services due to pt's recent medical setbacks and amount of time/energy needed for her to attend outpatient appointments. Pt and her sister report they would like to transition to [facility] at this time, Telephone Encounter sent to PCP regarding family wishes to transition to [facility] therapies and that this is the most appropriate option for patient at this time.   PATIENT EDUCATION: Education details: Continue HEP, d/c from OPPT to transition to [place] educated: Patient and sister Clinical cytogeneticist Education method: Explanation, Demonstration, Corporate treasurer cues, Verbal cues, and Handouts Education comprehension: verbalized understanding  HOME EXERCISE PROGRAM: Access Code: [email or web address] URL: [email or web address] Date: 11/12/2021 Prepared by: Mickie Bail Plaster  Exercises - Sit to Stand Without Arm Support  - 1 x daily - 7 x weekly - 3 sets - 10 reps - Standing March with Counter Support  - 1 x daily - 7 x weekly - 3 sets - 10 reps - Heel Toe Raises with Counter Support  - 1 x daily - 7 x weekly - 3 sets - 10 reps   GOALS: Goals reviewed with patient? Yes  SHORT TERM GOALS: Target date: 12/03/2021  Pt will be independent with initial HEP for improved strength, balance, transfers and gait. Baseline: Goal status: MET  2.  Pt will improve gait velocity to at least 1.5 ft/sec for improved gait efficiency and performance at S* level  Baseline: 1.19 ft/sec with rollator and S* (10/26), 0.95 ft/sec (11/28) Goal status: NOT MET  3.  Pt will improve normal TUG to less than or equal to 45 seconds for improved functional mobility and decreased fall risk. Baseline: 59.52 sec with rollator, 43.6 sec with rollator (11/28) Goal status: MET  4.  Pt will improve Berg score to 28/56 for decreased fall risk Baseline: 23/56, 28/56 (11/28) Goal status: MET   LONG TERM GOALS: Target date: 12/31/2021  Pt will be independent with final HEP for improved strength, balance, transfers and  gait. Baseline:  Goal status: NOT MET  2.  Pt will improve gait velocity to at least 2.0 ft/sec for improved gait efficiency and performance at S* level  Baseline: 1.19 ft/sec with S* and rollator (10/26) Goal status: NOT MET  3.  Pt will improve normal TUG to less than or equal to 30 seconds for improved functional mobility and decreased fall risk. Baseline: 59.52 sec with rollator Goal status: NOT MET  4.  Pt will improve Berg score to 34/56 for decreased fall risk  Baseline: 23/56 Goal status: NOT MET   ASSESSMENT:  CLINICAL IMPRESSION: Emphasis of skilled PT session on reassessing goals due to patient preference to d/c from OPPT services and resuming PT services at [facility] level due to several medical setbacks and overall decrease in energy level. Pt would be more appropriate to be seen at home health level at this time. Pt has met 3/4 STG due to being independent with her initial HEP, improving her TUG score from 59.52 sec to 43.6 sec this date and improving her Berg score from 23/56 to 28/56. These scores indicate a decreased fall risk. However, her gait speed decreased from 1.19 ft/sec  initially to 0.95 ft/sec this date, indicating an increased fall risk. Pt has met 0/4 LTG due to early d/c from [facility]. Overall, pt has demonstrated intermittent progress during therapy due to ongoing medical issues and fluctuating blood pressures. Pt will benefit from initiation of physical therapy services at home health level.   OBJECTIVE IMPAIRMENTS: Abnormal gait, cardiopulmonary status limiting activity, decreased activity tolerance, decreased balance, decreased cognition, decreased coordination, decreased endurance, decreased knowledge of condition, decreased knowledge of use of DME, decreased mobility, difficulty walking, decreased ROM, decreased strength, decreased safety awareness, impaired sensation, and impaired vision/preception.   ACTIVITY LIMITATIONS: carrying, lifting, bending, squatting,  stairs, transfers, and bed mobility  PARTICIPATION LIMITATIONS: driving and community activity  PERSONAL FACTORS: Age and 1-2 comorbidities:    poorly controlled DM 2, hypertension, CVA, proteinuria, CKD 3, and CAD status post DES to RCA in 03/2020 are also affecting patient's functional outcome.   REHAB POTENTIAL: Good  CLINICAL DECISION MAKING: Stable/uncomplicated  EVALUATION COMPLEXITY: Moderate    Excell Seltzer, PT, DPT, CSRS 12/08/2021, 6:23 PM

## 2021-12-08 NOTE — Telephone Encounter (Signed)
Called [name] per DPR.  No answer. LMOM

## 2021-12-08 NOTE — Patient Instructions (Signed)
Medication Instructions:  CHANGE Furosemide to as needed only *If you need a refill on your cardiac medications before your next appointment, please call your pharmacy*   Lab Work: BMET today If you have labs (blood work) drawn today and your tests are completely normal, you will receive your results only by: [place] (if you have MyChart) OR A paper copy in the mail If you have any lab test that is abnormal or we need to change your treatment, we will call you to review the results.   Testing/Procedures: NONE   Follow-Up: At [facility], you and your health needs are our priority.  As part of our continuing mission to provide you with exceptional heart care, we have created designated Provider Care Teams.  These Care Teams include your primary Cardiologist (physician) and Advanced Practice Providers (APPs -  Physician Assistants and Nurse Practitioners) who all work together to provide you with the care you need, when you need it.  Your next appointment:   03/22/22 @ 11:40  The format for your next appointment:   In Person  Provider:   Werner Lean, MD       Important Information About Sugar

## 2021-12-09 ENCOUNTER — Telehealth: Payer: Self-pay | Admitting: Family Medicine

## 2021-12-09 ENCOUNTER — Encounter: Payer: Self-pay | Admitting: Occupational Therapy

## 2021-12-09 NOTE — Patient Instructions (Signed)
Visit Information  Thank you for taking time to visit with me today. Please don't hesitate to contact me if I can be of assistance to you.   Following are the goals we discussed today:   Goals Addressed             This Visit's Progress    Obtain Safe and Stable Transportation       Care Coordination Interventions: Solution-Focused Strategies employed:  Active listening / Reflection utilized  Emotional Support Provided Caregiver stress acknowledged  Verbalization of feelings encouraged  LCSW received an incoming call from [place] Pt is doing okay. Family hasn't heard from transportation, [facility] [place]. LCSW will follow up Family will review caregiver's guide regarding local resources. Pt has changed healthplan to obtain additional benefits beginning Jan 1st Last scheduled OT/PT is late December 2023 LCSW reviewed upcoming appts Pt's sister spoke with [facility] Nurse, Maudie Mercury, regarding concerns with administering medications. Family hope to hear back about grinding medications. She is also providing assistance with scheduling appt with kidney doctor             Please call the care guide team at [phone] if you need to cancel or reschedule your appointment.   If you are experiencing a Mental Health or [place] or need someone to talk to, please call the Suicide and Crisis Lifeline: 988 call 911   Patient verbalizes understanding of instructions and care plan provided today and agrees to view in [place]. Active MyChart status and patient understanding of how to access instructions and care plan via MyChart confirmed with patient.     Christa See, MSW, [email or web address] Phone [phone] 5:23 PM

## 2021-12-09 NOTE — Telephone Encounter (Signed)
Okay to refill? 

## 2021-12-09 NOTE — Telephone Encounter (Signed)
Pt is with her sister Judson Roch and pt would like a refill onondansetron (ZOFRAN) 4 MG tablet  that was prescribed by GI md  [id number] - [address], [address] AT [address] Phone: [phone]  Fax: [phone]    Pt has had a stroke

## 2021-12-10 ENCOUNTER — Ambulatory Visit: Payer: Medicare Other | Admitting: Occupational Therapy

## 2021-12-10 ENCOUNTER — Telehealth: Payer: Self-pay | Admitting: Family Medicine

## 2021-12-10 ENCOUNTER — Ambulatory Visit: Payer: Medicare Other | Admitting: Physical Therapy

## 2021-12-10 LAB — BASIC METABOLIC PANEL
BUN/Creatinine Ratio: 18 (ref 12–28)
BUN: 15 mg/dL (ref 8–27)
CO2: 24 mmol/L (ref 20–29)
Calcium: 9.8 mg/dL (ref 8.7–10.3)
Chloride: 106 mmol/L (ref 96–106)
Creatinine, Ser: 0.85 mg/dL (ref 0.57–1.00)
Glucose: 115 mg/dL — ABNORMAL HIGH (ref 70–99)
Potassium: 4.1 mmol/L (ref 3.5–5.2)
Sodium: 144 mmol/L (ref 134–144)
eGFR: 72 mL/min/{1.73_m2} (ref >59)

## 2021-12-10 NOTE — Progress Notes (Signed)
Kindly inform the patient that heart monitor study showed a few irregular heartbeats are quite common but nothing to worry about.  No evidence of atrial fibrillation or any worrisome arrhythmia

## 2021-12-10 NOTE — Telephone Encounter (Signed)
Wants to let you know patient went to heart doctor and he did not advise her to get covid vaccine because she is not thriving, she has lost 13 pounds and now weighs 108 as of Monday. Seeking your opinion on how strict they should be on her sugar restrictions in light of her losing weight. Patient was given meds by colonoscopy group to help with her nausea ondansetron 4 mg, takes them occasionally. Requesting a refill and a call to discuss.

## 2021-12-11 ENCOUNTER — Encounter: Payer: Self-pay | Admitting: Family Medicine

## 2021-12-11 ENCOUNTER — Telehealth (INDEPENDENT_AMBULATORY_CARE_PROVIDER_SITE_OTHER): Payer: Medicare Other | Admitting: Family Medicine

## 2021-12-11 DIAGNOSIS — Z794 Long term (current) use of insulin: Secondary | ICD-10-CM

## 2021-12-11 DIAGNOSIS — R112 Nausea with vomiting, unspecified: Secondary | ICD-10-CM | POA: Diagnosis not present

## 2021-12-11 DIAGNOSIS — E1142 Type 2 diabetes mellitus with diabetic polyneuropathy: Secondary | ICD-10-CM

## 2021-12-11 DIAGNOSIS — N182 Chronic kidney disease, stage 2 (mild): Secondary | ICD-10-CM | POA: Diagnosis not present

## 2021-12-11 DIAGNOSIS — I63411 Cerebral infarction due to embolism of right middle cerebral artery: Secondary | ICD-10-CM

## 2021-12-11 DIAGNOSIS — M79604 Pain in right leg: Secondary | ICD-10-CM | POA: Diagnosis not present

## 2021-12-11 MED ORDER — ONDANSETRON HCL 4 MG PO TABS: 4.0000 mg | ORAL_TABLET | Freq: Two times a day (BID) | ORAL | 1 refills | Status: DC | PRN

## 2021-12-11 NOTE — Telephone Encounter (Signed)
Can you see if sister can do a phone call with Dr. [name] today at 4:30? They can discuss everything at that time.

## 2021-12-11 NOTE — Assessment & Plan Note (Signed)
Problem has been persistent for a few months. ? Radicular pain. Lumbar MRI will be arranged. Continue Tramadol and Gabapentin same dose.

## 2021-12-11 NOTE — Telephone Encounter (Signed)
Has appt this afternoon.

## 2021-12-11 NOTE — Assessment & Plan Note (Signed)
Unspecified proteinuria. Problem has been stable. Continue Farxiga 10 mg daily and Losartan 50 mg daily. According to pt, she has an appt later this month.

## 2021-12-11 NOTE — Assessment & Plan Note (Signed)
Recent BS's have improved, treatment has been adjusted by endocrinologist. Stressed the importance of compliance with dietary recommendations. Continue Toujeo and Victoza same dose. Caution with skipping meals, continue monitoring BS's daily.

## 2021-12-11 NOTE — Progress Notes (Signed)
Virtual Visit via Telephone Note I connected with Sarah [name] on 12/11/21 at  4:30 PM EST by telephone and verified that I am speaking with the correct person using two identifiers.   I discussed the limitations, risks, security and privacy concerns of performing an evaluation and management service by telephone and the availability of in person appointments. I also discussed with the patient that there may be a patient responsible charge related to this service. The patient expressed understanding and agreed to proceed.  Location patient: home Location provider: work office Participants present for the call: patient, sister,and provider Patient did not have a visit in the prior 7 days to address this/these issue(s).  Chief Complaint  Patient presents with   Follow-up   History of Present Illness: Sarah [name] is a 76 y.o.female with hx of  CAD,CVA,poorly controlled DM II, CKD II, anxiety, PAD, and GERD  ,whose sister has some concerns in regard her state of health. Conference phone call with pt and sister. Her sister is concerned about  weight loss, dropping from 122 pounds in late October to 108 pounds currently. Her sister is asking if dietary restrictions can be re-evaluated and let her eat "all she wants."   States that she recently saw her cardiologist and it was recommended not to take [place] 19 vaccination doe to failure to thrive. Her last HgA1C was 12.6 in October/2023, her treatment was adjusted. Reporting BS's 130-150's most of the time, using DEXACOM. She denies episodes of hypoglycemia.  Her sister is also reporting that Sarah [name] is experiencing morning nausea and vomiting, which has been controlled with ondansetron (Zofran). Requesting prescriptions refill, it was given by GI. It is not daily, a couple time per week since last hospitalization. She has not identified exacerbating factors. Negative for abdominal pain or changes in bowel habits.  She has been  attending outpt physical therapy but it has been recommended to transition to home-based physical therapy due to fatigue.   CKD II: She has an appt with her nephrologist in 12/2021 and with endocrinologist in 01/2022.  Her sister is also concerned about persistent RLE pain that seems radiated from back. She is on Tramadol and Gabapentin. State that LE pain is sometimes severe,now it is more around knee and ankle. No edema or erythema. Back pain has not been bad. ABI otherwise negative in 11/2021.  Observations/Objective: Patient sounds cheerful and well on the phone. I do not appreciate any SOB. Speech and thought processing are grossly intact. Patient reported vitals:  Assessment and Plan: Sarah [name] is a 76 yo female that was seen today because her sister is concerned about her state of health.  CKD (chronic kidney disease), stage II Assessment & Plan: Unspecified proteinuria. Problem has been stable. Continue Farxiga 10 mg daily and Losartan 50 mg daily. According to pt, she has an appt later this month.    Type 2 diabetes mellitus with diabetic polyneuropathy, with long-term current use of insulin (HCC) Assessment & Plan: Recent BS's have improved, treatment has been adjusted by endocrinologist. Stressed the importance of compliance with dietary recommendations. Continue Toujeo and Victoza same dose. Caution with skipping meals, continue monitoring BS's daily.   Cerebral infarction due to embolism of right middle cerebral artery [facility]) Assessment & Plan: She thinks PT has been beneficial, she wants to continue PT at home due to severe fatigue. HH referral placed.  Orders: -     Ambulatory referral to Home Health  Nausea and vomiting in adult Assessment &  Plan: We discussed possible causes. ? Dyspepsia, gastroparesis among some to consider. GERD p[precautions. Zofran has helped, so continue bid prn.  Orders: -     Ondansetron HCl; Take 1 tablet (4 mg total) by  mouth 2 (two) times daily as needed for nausea or vomiting.  Dispense: 60 tablet; Refill: 1  Pain of right lower extremity Assessment & Plan: Problem has been persistent for a few months. ? Radicular pain. Lumbar MRI will be arranged. Continue Tramadol and Gabapentin same dose.  Orders: -     MR LUMBAR SPINE WO CONTRAST; Future -     Ambulatory referral to Home Health  Follow Up Instructions:  Return if symptoms worsen or fail to improve, for keep next appointment.  I did not refer this patient for an OV in the next 24 hours for this/these issue(s).  I discussed the assessment and treatment plan with the patient. The patient was provided an opportunity to ask questions and all were answered. The patient agreed with the plan and demonstrated an understanding of the instructions.   The patient was advised to call back or seek an in-person evaluation if the symptoms worsen or if the condition fails to improve as anticipated.  I provided 23 minutes of non-face-to-face time during this encounter.  Fatema Rabe G. [name], MD  [facility]. [place] office.

## 2021-12-11 NOTE — Assessment & Plan Note (Signed)
We discussed possible causes. ? Dyspepsia, gastroparesis among some to consider. GERD p[precautions. Zofran has helped, so continue bid prn.

## 2021-12-11 NOTE — Assessment & Plan Note (Signed)
She thinks PT has been beneficial, she wants to continue PT at home due to severe fatigue. HH referral placed.

## 2021-12-14 NOTE — Telephone Encounter (Signed)
Already referred.  Thanks, BJ

## 2021-12-15 ENCOUNTER — Ambulatory Visit: Payer: Medicare Other | Admitting: Physical Therapy

## 2021-12-15 ENCOUNTER — Encounter: Payer: Medicare Other | Admitting: Occupational Therapy

## 2021-12-17 ENCOUNTER — Encounter: Payer: Medicare Other | Admitting: Occupational Therapy

## 2021-12-17 ENCOUNTER — Telehealth: Payer: Self-pay | Admitting: Licensed Clinical Social Worker

## 2021-12-17 ENCOUNTER — Encounter: Payer: Medicare Other | Admitting: Speech Pathology

## 2021-12-17 ENCOUNTER — Ambulatory Visit: Payer: Medicare Other | Admitting: Physical Therapy

## 2021-12-18 NOTE — Addendum Note (Signed)
Addended by: [name], Glendell Fouse G on: 12/18/2021 05:06 PM   Modules accepted: Orders

## 2021-12-21 DIAGNOSIS — Z794 Long term (current) use of insulin: Secondary | ICD-10-CM | POA: Diagnosis not present

## 2021-12-21 DIAGNOSIS — R809 Proteinuria, unspecified: Secondary | ICD-10-CM | POA: Diagnosis not present

## 2021-12-21 DIAGNOSIS — I129 Hypertensive chronic kidney disease with stage 1 through stage 4 chronic kidney disease, or unspecified chronic kidney disease: Secondary | ICD-10-CM | POA: Diagnosis not present

## 2021-12-21 DIAGNOSIS — E1165 Type 2 diabetes mellitus with hyperglycemia: Secondary | ICD-10-CM | POA: Diagnosis not present

## 2021-12-21 NOTE — Patient Instructions (Signed)
Visit Information  Thank you for taking time to visit with me today. Please don't hesitate to contact me if I can be of assistance to you.   Following are the goals we discussed today:   Goals Addressed             This Visit's Progress    Obtain Safe and Stable Transportation   On track    Care Coordination Interventions: Solution-Focused Strategies employed:  Active listening / Reflection utilized  Emotional Support Provided Caregiver stress acknowledged  Verbalization of feelings encouraged  Patient has not heard from [place] appts reviewed. Family will transport pt to appts LCSW spoke with [name], with [employer] [place]. Could not confirm receipt of application and requested new application. LCSW will update family and email application            Please call the care guide team at [phone] if you need to cancel or reschedule your appointment.   If you are experiencing a Mental Health or [place] or need someone to talk to, please call the Suicide and Crisis Lifeline: 988 call 911   Patient verbalizes understanding of instructions and care plan provided today and agrees to view in [place]. Active MyChart status and patient understanding of how to access instructions and care plan via MyChart confirmed with patient.     Christa See, MSW, [email or web address] Phone [phone] 4:40 PM

## 2021-12-21 NOTE — Patient Outreach (Signed)
  Care Coordination   Follow Up Visit Note   12/21/2021 Name: Sarah [name] MRN: [id number] DOB: [birth year]  Sarah [name] is a 76 y.o. year old female who sees Sarah [name], Sarah So, MD for primary care. I spoke with  Sarah [name]'s sister, Sarah [name], by phone today.  What matters to the patients health and wellness today?  Transportation    Goals Addressed             This Visit's Progress    Obtain Safe and Stable Transportation   On track    Care Coordination Interventions: Solution-Focused Strategies employed:  Active listening / Reflection utilized  Emotional Support Provided Caregiver stress acknowledged  Verbalization of feelings encouraged  Patient has not heard from [place] appts reviewed. Family will transport pt to appts LCSW spoke with Sarah [name], with [employer] [place]. Could not confirm receipt of application and requested new application. LCSW will update family and email application             SDOH assessments and interventions completed:  No     Care Coordination Interventions:  Yes, provided   Follow up plan: Follow up call scheduled for 2-4 weeks    Encounter Outcome:  Pt. Visit Completed   Sarah [name], MSW, [name] Phone [phone] 4:39 PM

## 2021-12-22 ENCOUNTER — Ambulatory Visit: Payer: Medicare Other | Admitting: Physical Therapy

## 2021-12-23 ENCOUNTER — Ambulatory Visit (HOSPITAL_COMMUNITY): Admission: RE | Admit: 2021-12-23 | Payer: Medicare Other | Source: Ambulatory Visit

## 2021-12-24 ENCOUNTER — Ambulatory Visit: Payer: Medicare Other | Admitting: Physical Therapy

## 2021-12-24 ENCOUNTER — Ambulatory Visit (HOSPITAL_COMMUNITY)
Admission: RE | Admit: 2021-12-24 | Discharge: 2021-12-24 | Disposition: A | Discharge status: Home / Self Care | Payer: Medicare Other | Source: Ambulatory Visit | Attending: Family Medicine | Admitting: Family Medicine

## 2021-12-24 ENCOUNTER — Telehealth: Payer: Self-pay | Admitting: Licensed Clinical Social Worker

## 2021-12-24 DIAGNOSIS — M79604 Pain in right leg: Secondary | ICD-10-CM | POA: Insufficient documentation

## 2021-12-24 DIAGNOSIS — M5116 Intervertebral disc disorders with radiculopathy, lumbar region: Secondary | ICD-10-CM | POA: Diagnosis not present

## 2021-12-24 DIAGNOSIS — M48061 Spinal stenosis, lumbar region without neurogenic claudication: Secondary | ICD-10-CM | POA: Diagnosis not present

## 2021-12-24 DIAGNOSIS — M4726 Other spondylosis with radiculopathy, lumbar region: Secondary | ICD-10-CM | POA: Diagnosis not present

## 2021-12-24 NOTE — Patient Outreach (Signed)
  Care Coordination Late Entry/Documentation  Follow Up Visit Note   Outreach completed on 12/21/21 Name: Sarah [name] MRN: [id number] DOB: [birth year]  Sarah [name] is a 76 y.o. year old female who sees Sarah [name], Sarah So, MD for primary care. I spoke with  Sarah [name]'s sister, by phone today.  What matters to the patients health and wellness today?  [employer] [place]    Goals Addressed             This Visit's Progress    Obtain Safe and Stable Transportation   On track    Care Coordination Interventions: Solution-Focused Strategies employed:  Active listening / Reflection utilized  Emotional Support Provided Caregiver stress acknowledged  Verbalization of feelings encouraged  LCSW informed pt's sister that a new [place] Application will need to be submitted for review Pt's sister assisted in providing answers for application LCSW submitted application via email on 12/24/21. Pt's sister was cc'd             SDOH assessments and interventions completed:  No     Care Coordination Interventions:  Yes, provided   Follow up plan: Follow up call scheduled for 4 weeks    Encounter Outcome:  Pt. Visit Completed   Sarah [name], MSW, [name] Phone [phone] 5:00 PM

## 2021-12-24 NOTE — Patient Instructions (Signed)
Visit Information  Thank you for taking time to visit with me today. Please don't hesitate to contact me if I can be of assistance to you.   Following are the goals we discussed today:   Goals Addressed             This Visit's Progress    Obtain Safe and Stable Transportation   On track    Care Coordination Interventions: Solution-Focused Strategies employed:  Active listening / Reflection utilized  Emotional Support Provided Caregiver stress acknowledged  Verbalization of feelings encouraged  LCSW informed pt's sister that a new [place] Application will need to be submitted for review Pt's sister assisted in providing answers for application LCSW submitted application via email on 12/24/21. Pt's sister was cc'd             Our next appointment is by telephone on 02/02/22 at 3:30 PM  Please call the care guide team at [phone] if you need to cancel or reschedule your appointment.   If you are experiencing a Mental Health or [place] or need someone to talk to, please call the Suicide and Crisis Lifeline: 988 call 911   Patient verbalizes understanding of instructions and care plan provided today and agrees to view in [place]. Active MyChart status and patient understanding of how to access instructions and care plan via MyChart confirmed with patient.     Christa See, MSW, [email or web address] Phone [phone] 5:01 PM

## 2021-12-28 ENCOUNTER — Telehealth: Payer: Self-pay | Admitting: Family Medicine

## 2021-12-28 NOTE — Telephone Encounter (Signed)
Okay for verbal 

## 2021-12-28 NOTE — Telephone Encounter (Signed)
It is okay to give verbal authorization for requested services. Thanks, BJ

## 2021-12-28 NOTE — Telephone Encounter (Signed)
Sarah [name] - PT with [place] leave a detailed message on this line  Verbal Order  Start of care / eval cancelled by Pt   [name] requesting new order for start of care for  Wednesday or Thursday of this week

## 2021-12-29 ENCOUNTER — Ambulatory Visit: Payer: Medicare Other | Admitting: Physical Therapy

## 2021-12-29 NOTE — Telephone Encounter (Signed)
Verbal given to [place] [employer].

## 2021-12-30 ENCOUNTER — Ambulatory Visit: Payer: Self-pay | Admitting: *Deleted

## 2021-12-30 NOTE — Patient Outreach (Signed)
  Care Coordination   Follow Up Visit Note   12/30/2021 Name: Sarah [name] MRN: [id number] DOB: [birth year]  Sarah [name] is a 76 y.o. year old female who sees [name], Malka So, MD for primary care. I spoke with her sister, Sarah [name] by phone today.  What matters to the patients health and wellness today?  Doing okay, had to replace removed Dexcom transmitter after imaging, copy of [place] access application for her records preferred    Now scheduled to receive at home PT but she got a cold on Monday 12/28/21, and had to postpone the first visits Missed urology/nephrology appointment also related to cold this week- not rescheduled yet   "Cold" assessment- Since last Thursday, 12/24/21 symptoms were Sore throat & coughing. Confirmed a Negative covid home test. Patient thinks her sister in law gave her the cold as she was coughing when caring for her during that week. Feels better now.   [facility] long completed and the staff took her Dexcom transmitter off but did not return it. The sister in law forgot to request the transmitter back. The patient informed sister Sarah [name] who assisted by calling [employer] to explain and they agreed to send the patient a new one at no cost   Completed [place] access application - [place], [facility] SW completed and faxed it in. Sarah [name] is interested of getting a copy for the patient's records    Goals Addressed               This Visit's Progress     Patient Stated     Confirm and schedule nephrology post hospital visit [facility]) (pt-stated)   Not on track     Care Coordination Interventions: Assessed the sister, Sarah [name] understanding of chronic kidney disease    Evaluation of current treatment plan related to chronic kidney disease self management and patient's adherence to plan as established by provider      Reviewed scheduled/upcoming provider appointments including    Discussed plans with patient for ongoing care management follow up and  provided patient with direct contact information for care management team    Outreach to pcp B [name] who reports it would fine  to wait until 12/30/21 for the nephrology/urology visit, her renal function seems to be stable. This appointment was confirmed to be missed as the patient has developed a cold.  Pending rescheduling as needed       COMPLETED: manage/reconcilliation of medications ([facility]) (pt-stated)   On track     Care Coordination Interventions: Reviewed medications with patient and discussed importance of medication adherence Discussed plans with patient for ongoing care management follow up and provided patient with direct contact information for care management team Confirmed patient is receiving all her prescribed medicines     Goal complete      obtain copy of transportation application in paper form ([facility] ) (pt-stated)        Outreached to [facility] SW to attempt to get a copy of the [place] application in order to get a paper copy  Mailed a copy to patient and her sister        SDOH assessments and interventions completed:  No     Care Coordination Interventions:  Yes, provided   Follow up plan: Follow up call scheduled for 02/01/22    Encounter Outcome:  Pt. Visit Completed   Henli Hey L. [name], RN, BSN, [place] Coordinator Office number [phone]

## 2021-12-30 NOTE — Patient Instructions (Signed)
Visit Information  Thank you for taking time to visit with me today. Please don't hesitate to contact me if I can be of assistance to you.   Following are the goals we discussed today:   Goals Addressed               This Visit's Progress     Patient Stated     Confirm and schedule nephrology post hospital visit [facility]) (pt-stated)   Not on track     Care Coordination Interventions: Assessed the sister, [name] understanding of chronic kidney disease    Evaluation of current treatment plan related to chronic kidney disease self management and patient's adherence to plan as established by provider      Reviewed scheduled/upcoming provider appointments including    Discussed plans with patient for ongoing care management follow up and provided patient with direct contact information for care management team    Outreach to pcp B [name] who reports it would fine  to wait until 12/30/21 for the nephrology/urology visit, her renal function seems to be stable. This appointment was confirmed to be missed as the patient has developed a cold.  Pending rescheduling as needed       COMPLETED: manage/reconcilliation of medications ([facility]) (pt-stated)   On track     Care Coordination Interventions: Reviewed medications with patient and discussed importance of medication adherence Discussed plans with patient for ongoing care management follow up and provided patient with direct contact information for care management team Confirmed patient is receiving all her prescribed medicines     Goal complete      obtain copy of transportation application in paper form [facility] ) (pt-stated)        Outreached to [facility] SW to attempt to get a copy of the [place] application in order to get a paper copy  Mailed a copy to patient and her sister        Our next appointment is by telephone on 02/01/22 at 1030  Please call the care guide team at [phone] if you need to cancel or reschedule your appointment.   If  you are experiencing a Mental Health or [place] or need someone to talk to, please call the Suicide and Crisis Lifeline: 988 call the [place] National Suicide Prevention Lifeline: (301) 826-2537 or TTY: 831-128-6472 TTY 865-328-3581) to talk to a trained counselor call 1-800-273-TALK (toll free, 24 hour hotline) go to [facility] [address] [place] [phone]) call 911   Patient verbalizes understanding of instructions and care plan provided today and agrees to view in [place]. Active MyChart status and patient understanding of how to access instructions and care plan via MyChart confirmed with patient.     The patient has been provided with contact information for the care management team and has been advised to call with any health related questions or concerns.   Laster Appling L. [name], RN, BSN, [place] Coordinator Office number [phone]

## 2021-12-31 ENCOUNTER — Encounter: Payer: Medicare Other | Admitting: Occupational Therapy

## 2021-12-31 ENCOUNTER — Ambulatory Visit: Payer: Medicare Other | Admitting: Physical Therapy

## 2021-12-31 DIAGNOSIS — Z794 Long term (current) use of insulin: Secondary | ICD-10-CM | POA: Diagnosis not present

## 2021-12-31 DIAGNOSIS — M810 Age-related osteoporosis without current pathological fracture: Secondary | ICD-10-CM | POA: Diagnosis not present

## 2021-12-31 DIAGNOSIS — N182 Chronic kidney disease, stage 2 (mild): Secondary | ICD-10-CM | POA: Diagnosis not present

## 2021-12-31 DIAGNOSIS — H409 Unspecified glaucoma: Secondary | ICD-10-CM | POA: Diagnosis not present

## 2021-12-31 DIAGNOSIS — Z87891 Personal history of nicotine dependence: Secondary | ICD-10-CM | POA: Diagnosis not present

## 2021-12-31 DIAGNOSIS — K5909 Other constipation: Secondary | ICD-10-CM | POA: Diagnosis not present

## 2021-12-31 DIAGNOSIS — E1151 Type 2 diabetes mellitus with diabetic peripheral angiopathy without gangrene: Secondary | ICD-10-CM | POA: Diagnosis not present

## 2021-12-31 DIAGNOSIS — Z7902 Long term (current) use of antithrombotics/antiplatelets: Secondary | ICD-10-CM | POA: Diagnosis not present

## 2021-12-31 DIAGNOSIS — K219 Gastro-esophageal reflux disease without esophagitis: Secondary | ICD-10-CM | POA: Diagnosis not present

## 2021-12-31 DIAGNOSIS — Z79891 Long term (current) use of opiate analgesic: Secondary | ICD-10-CM | POA: Diagnosis not present

## 2021-12-31 DIAGNOSIS — I69398 Other sequelae of cerebral infarction: Secondary | ICD-10-CM | POA: Diagnosis not present

## 2021-12-31 DIAGNOSIS — E1122 Type 2 diabetes mellitus with diabetic chronic kidney disease: Secondary | ICD-10-CM | POA: Diagnosis not present

## 2021-12-31 DIAGNOSIS — H53462 Homonymous bilateral field defects, left side: Secondary | ICD-10-CM | POA: Diagnosis not present

## 2021-12-31 DIAGNOSIS — I2510 Atherosclerotic heart disease of native coronary artery without angina pectoris: Secondary | ICD-10-CM | POA: Diagnosis not present

## 2021-12-31 DIAGNOSIS — M170 Bilateral primary osteoarthritis of knee: Secondary | ICD-10-CM | POA: Diagnosis not present

## 2021-12-31 DIAGNOSIS — R5383 Other fatigue: Secondary | ICD-10-CM | POA: Diagnosis not present

## 2021-12-31 DIAGNOSIS — I1310 Hypertensive heart and chronic kidney disease without heart failure, with stage 1 through stage 4 chronic kidney disease, or unspecified chronic kidney disease: Secondary | ICD-10-CM | POA: Diagnosis not present

## 2021-12-31 DIAGNOSIS — E1165 Type 2 diabetes mellitus with hyperglycemia: Secondary | ICD-10-CM | POA: Diagnosis not present

## 2021-12-31 DIAGNOSIS — E1142 Type 2 diabetes mellitus with diabetic polyneuropathy: Secondary | ICD-10-CM | POA: Diagnosis not present

## 2021-12-31 DIAGNOSIS — E785 Hyperlipidemia, unspecified: Secondary | ICD-10-CM | POA: Diagnosis not present

## 2021-12-31 DIAGNOSIS — I700 Atherosclerosis of aorta: Secondary | ICD-10-CM | POA: Diagnosis not present

## 2021-12-31 DIAGNOSIS — R627 Adult failure to thrive: Secondary | ICD-10-CM | POA: Diagnosis not present

## 2021-12-31 DIAGNOSIS — G5603 Carpal tunnel syndrome, bilateral upper limbs: Secondary | ICD-10-CM | POA: Diagnosis not present

## 2022-01-01 ENCOUNTER — Other Ambulatory Visit: Payer: Medicare Other

## 2022-01-01 DIAGNOSIS — E1151 Type 2 diabetes mellitus with diabetic peripheral angiopathy without gangrene: Secondary | ICD-10-CM | POA: Diagnosis not present

## 2022-01-01 DIAGNOSIS — K5909 Other constipation: Secondary | ICD-10-CM | POA: Diagnosis not present

## 2022-01-01 DIAGNOSIS — E1165 Type 2 diabetes mellitus with hyperglycemia: Secondary | ICD-10-CM | POA: Diagnosis not present

## 2022-01-01 DIAGNOSIS — M810 Age-related osteoporosis without current pathological fracture: Secondary | ICD-10-CM | POA: Diagnosis not present

## 2022-01-01 DIAGNOSIS — I2510 Atherosclerotic heart disease of native coronary artery without angina pectoris: Secondary | ICD-10-CM | POA: Diagnosis not present

## 2022-01-01 DIAGNOSIS — E1122 Type 2 diabetes mellitus with diabetic chronic kidney disease: Secondary | ICD-10-CM | POA: Diagnosis not present

## 2022-01-01 DIAGNOSIS — Z79891 Long term (current) use of opiate analgesic: Secondary | ICD-10-CM | POA: Diagnosis not present

## 2022-01-01 DIAGNOSIS — E1142 Type 2 diabetes mellitus with diabetic polyneuropathy: Secondary | ICD-10-CM | POA: Diagnosis not present

## 2022-01-01 DIAGNOSIS — I700 Atherosclerosis of aorta: Secondary | ICD-10-CM | POA: Diagnosis not present

## 2022-01-01 DIAGNOSIS — G5603 Carpal tunnel syndrome, bilateral upper limbs: Secondary | ICD-10-CM | POA: Diagnosis not present

## 2022-01-01 DIAGNOSIS — I1310 Hypertensive heart and chronic kidney disease without heart failure, with stage 1 through stage 4 chronic kidney disease, or unspecified chronic kidney disease: Secondary | ICD-10-CM | POA: Diagnosis not present

## 2022-01-01 DIAGNOSIS — K219 Gastro-esophageal reflux disease without esophagitis: Secondary | ICD-10-CM | POA: Diagnosis not present

## 2022-01-01 DIAGNOSIS — Z794 Long term (current) use of insulin: Secondary | ICD-10-CM | POA: Diagnosis not present

## 2022-01-01 DIAGNOSIS — Z87891 Personal history of nicotine dependence: Secondary | ICD-10-CM | POA: Diagnosis not present

## 2022-01-01 DIAGNOSIS — E785 Hyperlipidemia, unspecified: Secondary | ICD-10-CM | POA: Diagnosis not present

## 2022-01-01 DIAGNOSIS — R627 Adult failure to thrive: Secondary | ICD-10-CM | POA: Diagnosis not present

## 2022-01-01 DIAGNOSIS — H53462 Homonymous bilateral field defects, left side: Secondary | ICD-10-CM | POA: Diagnosis not present

## 2022-01-01 DIAGNOSIS — N182 Chronic kidney disease, stage 2 (mild): Secondary | ICD-10-CM | POA: Diagnosis not present

## 2022-01-01 DIAGNOSIS — I69398 Other sequelae of cerebral infarction: Secondary | ICD-10-CM | POA: Diagnosis not present

## 2022-01-01 DIAGNOSIS — R5383 Other fatigue: Secondary | ICD-10-CM | POA: Diagnosis not present

## 2022-01-01 DIAGNOSIS — H409 Unspecified glaucoma: Secondary | ICD-10-CM | POA: Diagnosis not present

## 2022-01-01 DIAGNOSIS — Z7902 Long term (current) use of antithrombotics/antiplatelets: Secondary | ICD-10-CM | POA: Diagnosis not present

## 2022-01-01 DIAGNOSIS — M170 Bilateral primary osteoarthritis of knee: Secondary | ICD-10-CM | POA: Diagnosis not present

## 2022-01-05 ENCOUNTER — Other Ambulatory Visit: Payer: Self-pay | Admitting: Family Medicine

## 2022-01-05 ENCOUNTER — Other Ambulatory Visit: Payer: Self-pay

## 2022-01-05 DIAGNOSIS — M545 Low back pain, unspecified: Secondary | ICD-10-CM

## 2022-01-05 DIAGNOSIS — I119 Hypertensive heart disease without heart failure: Secondary | ICD-10-CM

## 2022-01-06 DIAGNOSIS — Z7902 Long term (current) use of antithrombotics/antiplatelets: Secondary | ICD-10-CM | POA: Diagnosis not present

## 2022-01-06 DIAGNOSIS — M810 Age-related osteoporosis without current pathological fracture: Secondary | ICD-10-CM | POA: Diagnosis not present

## 2022-01-06 DIAGNOSIS — E1142 Type 2 diabetes mellitus with diabetic polyneuropathy: Secondary | ICD-10-CM | POA: Diagnosis not present

## 2022-01-06 DIAGNOSIS — I69398 Other sequelae of cerebral infarction: Secondary | ICD-10-CM | POA: Diagnosis not present

## 2022-01-06 DIAGNOSIS — E1122 Type 2 diabetes mellitus with diabetic chronic kidney disease: Secondary | ICD-10-CM | POA: Diagnosis not present

## 2022-01-06 DIAGNOSIS — R627 Adult failure to thrive: Secondary | ICD-10-CM | POA: Diagnosis not present

## 2022-01-06 DIAGNOSIS — M170 Bilateral primary osteoarthritis of knee: Secondary | ICD-10-CM | POA: Diagnosis not present

## 2022-01-06 DIAGNOSIS — H409 Unspecified glaucoma: Secondary | ICD-10-CM | POA: Diagnosis not present

## 2022-01-06 DIAGNOSIS — I1310 Hypertensive heart and chronic kidney disease without heart failure, with stage 1 through stage 4 chronic kidney disease, or unspecified chronic kidney disease: Secondary | ICD-10-CM | POA: Diagnosis not present

## 2022-01-06 DIAGNOSIS — I700 Atherosclerosis of aorta: Secondary | ICD-10-CM | POA: Diagnosis not present

## 2022-01-06 DIAGNOSIS — E1165 Type 2 diabetes mellitus with hyperglycemia: Secondary | ICD-10-CM | POA: Diagnosis not present

## 2022-01-06 DIAGNOSIS — Z794 Long term (current) use of insulin: Secondary | ICD-10-CM | POA: Diagnosis not present

## 2022-01-06 DIAGNOSIS — Z87891 Personal history of nicotine dependence: Secondary | ICD-10-CM | POA: Diagnosis not present

## 2022-01-06 DIAGNOSIS — E1151 Type 2 diabetes mellitus with diabetic peripheral angiopathy without gangrene: Secondary | ICD-10-CM | POA: Diagnosis not present

## 2022-01-06 DIAGNOSIS — G5603 Carpal tunnel syndrome, bilateral upper limbs: Secondary | ICD-10-CM | POA: Diagnosis not present

## 2022-01-06 DIAGNOSIS — N182 Chronic kidney disease, stage 2 (mild): Secondary | ICD-10-CM | POA: Diagnosis not present

## 2022-01-06 DIAGNOSIS — I2510 Atherosclerotic heart disease of native coronary artery without angina pectoris: Secondary | ICD-10-CM | POA: Diagnosis not present

## 2022-01-06 DIAGNOSIS — H53462 Homonymous bilateral field defects, left side: Secondary | ICD-10-CM | POA: Diagnosis not present

## 2022-01-06 DIAGNOSIS — K219 Gastro-esophageal reflux disease without esophagitis: Secondary | ICD-10-CM | POA: Diagnosis not present

## 2022-01-06 DIAGNOSIS — E785 Hyperlipidemia, unspecified: Secondary | ICD-10-CM | POA: Diagnosis not present

## 2022-01-06 DIAGNOSIS — Z79891 Long term (current) use of opiate analgesic: Secondary | ICD-10-CM | POA: Diagnosis not present

## 2022-01-06 DIAGNOSIS — R5383 Other fatigue: Secondary | ICD-10-CM | POA: Diagnosis not present

## 2022-01-06 DIAGNOSIS — K5909 Other constipation: Secondary | ICD-10-CM | POA: Diagnosis not present

## 2022-01-07 DIAGNOSIS — Z794 Long term (current) use of insulin: Secondary | ICD-10-CM | POA: Diagnosis not present

## 2022-01-07 DIAGNOSIS — I69398 Other sequelae of cerebral infarction: Secondary | ICD-10-CM | POA: Diagnosis not present

## 2022-01-07 DIAGNOSIS — I700 Atherosclerosis of aorta: Secondary | ICD-10-CM | POA: Diagnosis not present

## 2022-01-07 DIAGNOSIS — N182 Chronic kidney disease, stage 2 (mild): Secondary | ICD-10-CM | POA: Diagnosis not present

## 2022-01-07 DIAGNOSIS — K5909 Other constipation: Secondary | ICD-10-CM | POA: Diagnosis not present

## 2022-01-07 DIAGNOSIS — M170 Bilateral primary osteoarthritis of knee: Secondary | ICD-10-CM | POA: Diagnosis not present

## 2022-01-07 DIAGNOSIS — G5603 Carpal tunnel syndrome, bilateral upper limbs: Secondary | ICD-10-CM | POA: Diagnosis not present

## 2022-01-07 DIAGNOSIS — M810 Age-related osteoporosis without current pathological fracture: Secondary | ICD-10-CM | POA: Diagnosis not present

## 2022-01-07 DIAGNOSIS — Z87891 Personal history of nicotine dependence: Secondary | ICD-10-CM | POA: Diagnosis not present

## 2022-01-07 DIAGNOSIS — K219 Gastro-esophageal reflux disease without esophagitis: Secondary | ICD-10-CM | POA: Diagnosis not present

## 2022-01-07 DIAGNOSIS — E1122 Type 2 diabetes mellitus with diabetic chronic kidney disease: Secondary | ICD-10-CM | POA: Diagnosis not present

## 2022-01-07 DIAGNOSIS — E785 Hyperlipidemia, unspecified: Secondary | ICD-10-CM | POA: Diagnosis not present

## 2022-01-07 DIAGNOSIS — E1165 Type 2 diabetes mellitus with hyperglycemia: Secondary | ICD-10-CM | POA: Diagnosis not present

## 2022-01-07 DIAGNOSIS — E1142 Type 2 diabetes mellitus with diabetic polyneuropathy: Secondary | ICD-10-CM | POA: Diagnosis not present

## 2022-01-07 DIAGNOSIS — Z7902 Long term (current) use of antithrombotics/antiplatelets: Secondary | ICD-10-CM | POA: Diagnosis not present

## 2022-01-07 DIAGNOSIS — R5383 Other fatigue: Secondary | ICD-10-CM | POA: Diagnosis not present

## 2022-01-07 DIAGNOSIS — Z79891 Long term (current) use of opiate analgesic: Secondary | ICD-10-CM | POA: Diagnosis not present

## 2022-01-07 DIAGNOSIS — R627 Adult failure to thrive: Secondary | ICD-10-CM | POA: Diagnosis not present

## 2022-01-07 DIAGNOSIS — I2510 Atherosclerotic heart disease of native coronary artery without angina pectoris: Secondary | ICD-10-CM | POA: Diagnosis not present

## 2022-01-07 DIAGNOSIS — H409 Unspecified glaucoma: Secondary | ICD-10-CM | POA: Diagnosis not present

## 2022-01-07 DIAGNOSIS — E1151 Type 2 diabetes mellitus with diabetic peripheral angiopathy without gangrene: Secondary | ICD-10-CM | POA: Diagnosis not present

## 2022-01-07 DIAGNOSIS — H53462 Homonymous bilateral field defects, left side: Secondary | ICD-10-CM | POA: Diagnosis not present

## 2022-01-07 DIAGNOSIS — I1310 Hypertensive heart and chronic kidney disease without heart failure, with stage 1 through stage 4 chronic kidney disease, or unspecified chronic kidney disease: Secondary | ICD-10-CM | POA: Diagnosis not present

## 2022-01-14 ENCOUNTER — Telehealth: Payer: Self-pay | Admitting: Family Medicine

## 2022-01-14 NOTE — Telephone Encounter (Signed)
Shirlee Limerick - OT with [facility]   [phone]   Called to Report that Pt has had 2 falls in the last week   Pt's BP currently 160/76  Asystomatic  Seeing Ortho tomorrow for back and buttock pain after fall

## 2022-01-15 ENCOUNTER — Ambulatory Visit (INDEPENDENT_AMBULATORY_CARE_PROVIDER_SITE_OTHER): Payer: Medicare Other

## 2022-01-15 ENCOUNTER — Ambulatory Visit (INDEPENDENT_AMBULATORY_CARE_PROVIDER_SITE_OTHER): Payer: Medicare Other | Admitting: Physician Assistant

## 2022-01-15 ENCOUNTER — Encounter: Payer: Self-pay | Admitting: Physician Assistant

## 2022-01-15 ENCOUNTER — Ambulatory Visit: Payer: Medicare Other | Admitting: Internal Medicine

## 2022-01-15 DIAGNOSIS — G8929 Other chronic pain: Secondary | ICD-10-CM | POA: Diagnosis not present

## 2022-01-15 DIAGNOSIS — M545 Low back pain, unspecified: Secondary | ICD-10-CM

## 2022-01-15 NOTE — Telephone Encounter (Signed)
FYI

## 2022-01-15 NOTE — Progress Notes (Signed)
Office Visit Note   Patient: Sarah [name]           Date of Birth: 03/05/1945           MRN: [id number] Visit Date: 01/15/2022              Requested by: [name], [name], MD [address] [place],  [place] [address] PCP: [name], [name], MD  Chief Complaint  Patient presents with  . Lower Back - Pain      HPI: Sarah [name] is a pleasant 77 year old woman with a chief complaint of low back pain.  She was evaluated for this last fall.  She did have x-rays and an MRI which demonstrated multi level degenerative changes with some right-sided nerve root impingement at L3-4.  She said she did have chronic right leg tingling and paresthesias.  She did not have any specific treatment however in November she did have a stroke affecting the left side of her body.  She uses a walker for balance.  She has had a couple falls in the last few weeks.  She said she just falls.  Denies that her leg gives way.  She thinks it may secondary to her balance.  She comes in with her daughter-in-law today.  She is not really complaining of any back pain  Assessment & Plan: Visit Diagnoses:  1. Chronic bilateral low back pain, unspecified whether sciatica present     Plan: Chronic low back pain no acute changes in her x-rays.  She is neurovascularly intact.  Strength is actually fairly good on the left side with no evidence of foot drop and she is able to sustain a straight leg raise.  She has no tenderness with manipulation of her hips.  I would think this is mostly secondary to her balance issues as she has poor to no vision in one of her eyes since her stroke.  She does work with home physical therapy and I think continuing to work on this focusing on balance would be appropriate.  She has no loss of bowel or bladder control.  I spoke with her and her daughter-in-law.  They do have a follow-up with a neurologist and see if this is because from the central nervous system and balance issues.  She really if  she does not get better we could rule things out with another MRI they will contact me  Follow-Up Instructions: No follow-ups on file.   Ortho Exam  Patient is alert, oriented, no adenopathy, well-dressed, normal affect, normal respiratory effort. Pleasant 77 year old woman sitting in a chair with her wheeled walker.  She has 5 out of 5 strength with dorsiflexion and plantarflexion of both of her ankles.  She has good flexion and extension resistance with both of her legs.  Her sensation is at her baseline.  No foot drop compartments are soft and nontender.  No pain with manipulation of her hips.  She is tender to palpation in the very lower spine at L5-S1.  Imaging: XR Lumbar Spine 2-3 Views  Result Date: 01/15/2022 2 view radiographs of her lumbar spine demonstrate slight scoliosis.  She does have degenerative changes most prominent at L5-S1.  No listhesis or acute fractures noted.  No significant changes from previous x-rays done in September  No images are attached to the encounter.  Labs: Lab Results  Component Value Date   HGBA1C 12.6 (H) 10/25/2021   HGBA1C 11.7 (A) 08/31/2021   HGBA1C >15.0 05/01/2021   REPTSTATUS 10/01/2015  FINAL 09/26/2015   GRAMSTAIN  09/26/2015    ABUNDANT WBC PRESENT, PREDOMINANTLY PMN ABUNDANT GRAM POSITIVE COCCI IN PAIRS MODERATE GRAM VARIABLE ROD    CULT  09/26/2015    MODERATE STREPTOCOCCUS GROUP F FEW PREVOTELLA DISIENS BETA LACTAMASE POSITIVE      Lab Results  Component Value Date   ALBUMIN 1.6 (L) 10/26/2021   ALBUMIN 1.6 (L) 10/25/2021   ALBUMIN 2.7 (A) 09/16/2021    Lab Results  Component Value Date   MG 2.0 10/26/2021   MG 1.9 01/21/2020   MG 1.6 (L) 10/16/2016   Lab Results  Component Value Date   VD25OH 39.17 06/26/2018   VD25OH 20.16 (L) 12/19/2017   VD25OH 11.82 (L) 07/29/2017    No results found for: "PREALBUMIN"    Latest Ref Rng & Units 11/19/2021    2:36 PM 10/25/2021    7:04 AM 10/24/2021    5:11 AM  CBC  EXTENDED  WBC 4.0 - 10.5 K/uL 10.2  8.0  11.1   RBC 3.87 - 5.11 MIL/uL 3.78  3.66  3.82   Hemoglobin 12.0 - 15.0 g/dL 11.7  11.2  11.9   HCT 36.0 - 46.0 % 36.8  34.1  35.2   Platelets 150 - 400 K/uL 376  352  343   NEUT# 1.7 - 7.7 K/uL 8.1  5.3    Lymph# 0.7 - 4.0 K/uL 1.7  2.1       There is no height or weight on file to calculate BMI.  Orders:  Orders Placed This Encounter  Procedures  . XR Lumbar Spine 2-3 Views   No orders of the defined types were placed in this encounter.    Procedures: No procedures performed  Clinical Data: No additional findings.  ROS:  All other systems negative, except as noted in the HPI. Review of Systems  All other systems reviewed and are negative.  Objective: Vital Signs: There were no vitals taken for this visit.  Specialty Comments:  No specialty comments available.  PMFS History: Patient Active Problem List   Diagnosis Date Noted  . Pain of right lower extremity 12/11/2021  . Nausea and vomiting in adult 12/11/2021  . Elevated troponin   . Change in mental status 10/24/2021  . Long term current use of antithrombotics/antiplatelets 10/02/2021  . Heart murmur, systolic 74/08/1446  . Dizziness 04/08/2021  . Long term (current) use of antithrombotics/antiplatelets 12/16/2020  . Chronic constipation 12/16/2020  . Colon cancer screening 12/16/2020  . History of adenomatous polyp of colon 12/16/2020  . Coronary artery disease of native artery of native heart with stable angina pectoris ([place]) 03/04/2020  . Diabetes mellitus ([place]) 03/03/2020  . Aortic atherosclerosis ([place]) 02/12/2020  . Bilateral leg cramps 01/21/2020  . Bilateral lower extremity edema 01/21/2020  . Anxiety disorder 01/21/2020  . Type 2 diabetes mellitus with hyperglycemia, with long-term current use of insulin ([place]) 08/15/2019  . Type 2 diabetes mellitus with diabetic polyneuropathy, with long-term current use of insulin ([place]) 08/15/2019  . Uncontrolled type 2  diabetes mellitus with hypoglycemia without coma ([place]) 08/15/2019  . Hyperlipidemia LDL goal <70 08/15/2019  . Bilateral carpal tunnel syndrome 03/27/2019  . Osteoarthritis of both knees 11/27/2018  . CKD (chronic kidney disease), stage II 11/27/2018  . GERD (gastroesophageal reflux disease) 06/26/2018  . Homonymous hemianopia, left 02/08/2018  . Cerebrovascular accident (CVA) due to embolism of cerebral artery ([place])   . Diabetes mellitus with coincident hypertension ([place])   . Trigger finger, right index finger 10/17/2017  .  Low back pain 07/29/2017  . Carotid artery stenosis 11/02/2016  . Left arm weakness   . Diastolic dysfunction   . Hypertension with heart disease   . Acute blood loss anemia   . Cerebral infarction due to embolism of right middle cerebral artery ([place])   . Perirectal abscess 09/26/2015  . Vitamin D deficiency 07/23/2015  . Osteopenia 02/16/2013  . Ankle fracture 01/19/2013  . Anemia 09/27/2011  . Type II diabetes mellitus with nephropathy ([place]) 09/24/2011  . Mixed hyperlipidemia 09/24/2011  . Primary hypertension 09/24/2011   Past Medical History:  Diagnosis Date  . Bilateral carpal tunnel syndrome 03/27/2019  . Boils   . Glaucoma   . Heart murmur   . HTN (hypertension)   . Hx of adenomatous colonic polyps   . Hyperlipidemia   . Osteoporosis   . Pneumonia   . Stroke ([place])   . Type II or unspecified type diabetes mellitus without mention of complication, uncontrolled     Family History  Problem Relation Age of Onset  . Diabetes Mother   . Hypertension Mother   . Stroke Father   . Stroke Maternal Uncle   . Stroke Paternal Uncle   . Colon cancer Neg Hx   . Esophageal cancer Neg Hx   . Rectal cancer Neg Hx   . Stomach cancer Neg Hx   . Inflammatory bowel disease Neg Hx   . Liver disease Neg Hx   . Pancreatic cancer Neg Hx     Past Surgical History:  Procedure Laterality Date  . ABDOMINAL HYSTERECTOMY    . CARPAL TUNNEL RELEASE     right  .  COLONOSCOPY  12-10-10   per Dr. Deatra Ina, clear, repeat in 7 yrs   . CORONARY STENT INTERVENTION N/A 03/11/2020   Procedure: CORONARY STENT INTERVENTION;  Surgeon: Jettie Booze, MD;  Location: [facility];  Service: Cardiovascular;  Laterality: N/A;  . INCISION AND DRAINAGE PERIRECTAL ABSCESS N/A 09/26/2015   Procedure: IRRIGATION AND DEBRIDEMENT PERIRECTAL ABSCESS;  Surgeon: Mickeal Skinner, MD;  Location: [facility];  Service: General;  Laterality: N/A;  . INTRAVASCULAR ULTRASOUND/IVUS N/A 03/11/2020   Procedure: Intravascular Ultrasound/IVUS;  Surgeon: Jettie Booze, MD;  Location: [facility];  Service: Cardiovascular;  Laterality: N/A;  . KNEE ARTHROSCOPY     right  . LEFT HEART CATH AND CORONARY ANGIOGRAPHY N/A 03/11/2020   Procedure: LEFT HEART CATH AND CORONARY ANGIOGRAPHY;  Surgeon: Jettie Booze, MD;  Location: [facility];  Service: Cardiovascular;  Laterality: N/A;  . LOOP RECORDER INSERTION N/A 10/19/2016   Procedure: LOOP RECORDER INSERTION;  Surgeon: Thompson Grayer, MD;  Location: [facility];  Service: Cardiovascular;  Laterality: N/A;  . ORIF ANKLE FRACTURE Right 03/24/2012   Procedure: OPEN REDUCTION INTERNAL FIXATION (ORIF) ANKLE FRACTURE;  Surgeon: Newt Minion, MD;  Location: [facility];  Service: Orthopedics;  Laterality: Right;  Open Reduction Internal Fixation Right Bimalleolar ankle fracture  . POLYPECTOMY    . TEE WITHOUT CARDIOVERSION N/A 10/18/2016   Procedure: TRANSESOPHAGEAL ECHOCARDIOGRAM (TEE);  Surgeon: Fay Records, MD;  Location: [facility];  Service: Cardiovascular;  Laterality: N/A;  . TONSILLECTOMY     Social History   Occupational History  . Not on file  Tobacco Use  . Smoking status: Former    Types: Cigarettes    Quit date: 10/15/2016    Years since quitting: 5.2  . Smokeless tobacco: Never  . Tobacco comments:    smokes occ.  Vaping Use  . Vaping Use: Never used  Substance and Sexual Activity  . Alcohol  use: Yes    Alcohol/week: 0.0 standard drinks of alcohol    Comment: occ  . Drug use: No  . Sexual activity: Not on file

## 2022-01-18 ENCOUNTER — Telehealth: Payer: Self-pay | Admitting: Licensed Clinical Social Worker

## 2022-01-22 ENCOUNTER — Ambulatory Visit: Payer: Self-pay | Admitting: Licensed Clinical Social Worker

## 2022-01-22 ENCOUNTER — Telehealth: Payer: Self-pay | Admitting: Family Medicine

## 2022-01-22 NOTE — Patient Instructions (Signed)
Visit Information  Thank you for taking time to visit with me today. Please don't hesitate to contact me if I can be of assistance to you.   Following are the goals we discussed today:   Goals Addressed             This Visit's Progress    Obtain Safe and Stable Transportation   On track    Care Coordination Interventions: Solution-Focused Strategies employed:  Active listening / Reflection utilized  Emotional Support Provided Caregiver stress acknowledged  Verbalization of feelings encouraged  LCSW received update from pt's sister. Courtney with [facility] informed pt of needing additional information, to complete application LCSW will collaborate with [name] on additional ppwk and follow up with pt about progress Pt continues to receive strong support from family, who rotate in staying with patient to ensure safety and transportation to appts              Our next appointment is by telephone on 01/22/22 at 3:30 PM  Please call the care guide team at [phone] if you need to cancel or reschedule your appointment.   If you are experiencing a Mental Health or [place] or need someone to talk to, please call the Suicide and Crisis Lifeline: 988 call 911   Patient verbalizes understanding of instructions and care plan provided today and agrees to view in [place]. Active MyChart status and patient understanding of how to access instructions and care plan via MyChart confirmed with patient.     Christa See, MSW, [email or web address] Phone [phone] 9:54 AM'

## 2022-01-22 NOTE — Patient Instructions (Signed)
Visit Information  Thank you for taking time to visit with me today. Please don't hesitate to contact me if I can be of assistance to you.   Following are the goals we discussed today:   Goals Addressed             This Visit's Progress    Obtain Safe and Stable Transportation   On track    Care Coordination Interventions: Solution-Focused Strategies employed:  Active listening / Reflection utilized  Emotional Support Provided Caregiver stress acknowledged  Verbalization of feelings encouraged  LCSW received update from pt's sister. Courtney with [facility] informed pt of needing additional information, to complete application LCSW left a message for [place] requesting info on additional ppwk. An email requesting this information was sent out on 01/22/22 Pt continues to receive strong support from family, who rotate in staying with patient to ensure safety and transportation to appts LCSW attempted to update pt; however, home number was busy and mobile did not have voicemail set up              If you are experiencing a Mental Health or [place] or need someone to talk to, please call the Suicide and Crisis Lifeline: 988 call 911   Patient was not engaged during this encounter  Christa See, MSW, [email or web address] Phone [phone] 4:36 PM

## 2022-01-22 NOTE — Telephone Encounter (Signed)
Noted. FYI to PCP

## 2022-01-22 NOTE — Patient Outreach (Signed)
  Care Coordination   Follow Up Visit Note   01/22/2022 Name: Sarah [name] MRN: [id number] DOB: 1945-07-31  Sarah [name] is a 77 y.o. year old female who sees [name], Malka So, MD for primary care. LCSW made multiple attempts to reach out to [place] with [facility] regarding application What matters to the patients health and wellness today?  Patient was not engaged during encounter    Goals Addressed             This Visit's Progress    Obtain Safe and Stable Transportation   On track    Care Coordination Interventions: Solution-Focused Strategies employed:  Active listening / Reflection utilized  Emotional Support Provided Caregiver stress acknowledged  Verbalization of feelings encouraged  LCSW received update from pt's sister. Courtney with [facility] informed pt of needing additional information, to complete application LCSW left a message for [place] requesting info on additional ppwk. An email requesting this information was sent out on 01/22/22 Pt continues to receive strong support from family, who rotate in staying with patient to ensure safety and transportation to appts LCSW attempted to update pt; however, home number was busy and mobile did not have voicemail set up              SDOH assessments and interventions completed:  No     Care Coordination Interventions:  Yes, provided   Follow up plan: Follow up call scheduled for 1-2 weeks    Encounter Outcome:  Pt. Visit Completed   Christa See, MSW, [email or web address] Phone [phone] 4:35 PM

## 2022-01-22 NOTE — Telephone Encounter (Signed)
Shirlee Limerick OT [facility] is calling to report pt had missed visit for OT today. Pt had other obligations

## 2022-01-27 ENCOUNTER — Telehealth: Payer: Self-pay | Admitting: *Deleted

## 2022-01-27 ENCOUNTER — Telehealth: Payer: Self-pay | Admitting: Family Medicine

## 2022-01-27 NOTE — Progress Notes (Unsigned)
  Care Coordination Note  01/27/2022 Name: Sarah [name] MRN: [id number] DOB: 26-Jun-1945  Sarah [name] is a 77 y.o. year old female who is a primary care patient of [name], Malka So, MD and is actively engaged with the care management team. I reached out to [name] by phone today to assist with re-scheduling a follow up visit with the RN Case Manager  Follow up plan: Unsuccessful telephone outreach attempt made.   [place]  Direct Dial: [phone]

## 2022-01-27 NOTE — Telephone Encounter (Signed)
FYI

## 2022-01-27 NOTE — Telephone Encounter (Signed)
Reporting elevated blood pressure and mild headache since yesterday. 156/74. Yesterday BP was 140/70. F/u scheduled

## 2022-02-01 ENCOUNTER — Encounter: Payer: Medicare Other | Admitting: *Deleted

## 2022-02-01 NOTE — Progress Notes (Deleted)
ACUTE VISIT No chief complaint on file.  HPI: [name] is a 77 y.o. female, who is here today complaining of *** HPI  Review of Systems See other pertinent positives and negatives in HPI.  Current Outpatient Medications on File Prior to Visit  Medication Sig Dispense Refill   amLODipine (NORVASC) 5 MG tablet Take 1 tablet (5 mg total) by mouth daily. 90 tablet 3   atorvastatin (LIPITOR) 40 MG tablet Take 2 tablets (80 mg total) by mouth daily. 180 tablet 3   clopidogrel (PLAVIX) 75 MG tablet TAKE 1 TABLET(75 MG) BY MOUTH DAILY (Patient taking differently: Take 75 mg by mouth daily.) 90 tablet 1   dapagliflozin propanediol (FARXIGA) 10 MG TABS tablet Take 1 tablet (10 mg total) by mouth daily. 90 tablet 3   ezetimibe (ZETIA) 10 MG tablet Take 1 tablet (10 mg total) by mouth daily. 90 tablet 3   furosemide (LASIX) 20 MG tablet Take 1 tablet by mouth daily (only as needed) 90 tablet 3   hydrALAZINE (APRESOLINE) 10 MG tablet Take 1 tablet (10 mg total) by mouth 3 (three) times daily. 240 tablet 3   insulin glargine, 1 Unit Dial, (TOUJEO SOLOSTAR) 300 UNIT/ML Solostar Pen Inject 16 Units into the skin daily in the afternoon. (Patient taking differently: Inject 12 Units into the skin daily in the afternoon.) 15 mL 3   Insulin Pen Needle 32G X 4 MM MISC 1 Device by Does not apply route in the morning, at noon, in the evening, and at bedtime. 400 each 3   latanoprost (XALATAN) 0.005 % ophthalmic solution Place 1 drop into both eyes at bedtime.     liraglutide (VICTOZA) 18 MG/3ML SOPN Inject 1.2 mg into the skin daily.     losartan (COZAAR) 50 MG tablet TAKE 1 TABLET(50 MG) BY MOUTH DAILY 90 tablet 1   nitroGLYCERIN (NITROSTAT) 0.4 MG SL tablet Place 1 tablet (0.4 mg total) under the tongue every 5 (five) minutes as needed for chest pain. one tab every 5 minutes up to 3 tablets total over 15 minutes. 25 tablet 3   ondansetron (ZOFRAN) 4 MG tablet Take 1 tablet (4 mg total) by mouth  2 (two) times daily as needed for nausea or vomiting. 60 tablet 1   sertraline (ZOLOFT) 50 MG tablet Take 1 tablet (50 mg total) by mouth daily. 90 tablet 1   No current facility-administered medications on file prior to visit.    Past Medical History:  Diagnosis Date   Bilateral carpal tunnel syndrome 03/27/2019   Boils    Glaucoma    Heart murmur    HTN (hypertension)    Hx of adenomatous colonic polyps    Hyperlipidemia    Osteoporosis    Pneumonia    Stroke ([place])    Type II or unspecified type diabetes mellitus without mention of complication, uncontrolled    No Known Allergies  Social History   Socioeconomic History   Marital status: Single    Spouse name: Not on file   Number of children: Not on file   Years of education: Not on file   Highest education level: Not on file  Occupational History   Not on file  Tobacco Use   Smoking status: Former    Types: Cigarettes    Quit date: 10/15/2016    Years since quitting: 5.3   Smokeless tobacco: Never   Tobacco comments:    smokes occ.   Vaping Use   Vaping Use: Never used  Substance and Sexual Activity   Alcohol use: Yes    Alcohol/week: 0.0 standard drinks of alcohol    Comment: occ   Drug use: No   Sexual activity: Not on file  Other Topics Concern   Not on file  Social History Narrative   Not on file   Social Determinants of Health   Financial Resource Strain: Low Risk  (04/29/2021)   Overall Financial Resource Strain (CARDIA)    Difficulty of Paying Living Expenses: Not hard at all  Food Insecurity: No Food Insecurity (10/30/2021)   Hunger Vital Sign    Worried About Running Out of Food in the Last Year: Never true    Ran Out of Food in the Last Year: Never true  Transportation Needs: Unmet Transportation Needs (10/30/2021)   PRAPARE - Hydrologist (Medical): Yes    Lack of Transportation (Non-Medical): No  Physical Activity: Insufficiently Active (04/29/2021)   Exercise  Vital Sign    Days of Exercise per Week: 1 day    Minutes of Exercise per Session: 30 min  Stress: No Stress Concern Present (04/29/2021)   [place]    Feeling of Stress : Not at all  Social Connections: Socially Isolated (04/29/2021)   Social Connection and Isolation Panel [NHANES]    Frequency of Communication with Friends and Family: More than three times a week    Frequency of Social Gatherings with Friends and Family: More than three times a week    Attends Religious Services: Never    Marine scientist or Organizations: No    Attends Archivist Meetings: Never    Marital Status: Never married    There were no vitals filed for this visit. There is no height or weight on file to calculate BMI.  Physical Exam  ASSESSMENT AND PLAN: There are no diagnoses linked to this encounter.  No follow-ups on file.  [name]. [name], MD  [facility] [place] office.  Discharge Instructions   None

## 2022-02-02 ENCOUNTER — Telehealth: Payer: Self-pay | Admitting: *Deleted

## 2022-02-02 ENCOUNTER — Ambulatory Visit: Payer: Medicare Other | Admitting: Family Medicine

## 2022-02-02 ENCOUNTER — Telehealth: Payer: Self-pay | Admitting: Licensed Clinical Social Worker

## 2022-02-02 DIAGNOSIS — E1142 Type 2 diabetes mellitus with diabetic polyneuropathy: Secondary | ICD-10-CM

## 2022-02-02 NOTE — Patient Outreach (Signed)
  Care Coordination   Follow Up Visit Note   Outreach completed on 02/01/22 Name: Sarah [name] MRN: [id number] DOB: 1945-06-03  Sarah [name] is a 77 y.o. year old female who sees [name], Malka So, MD for primary care. I spoke with  Sarah [name] by phone today.  What matters to the patients health and wellness today?  Transportation    Goals Addressed             This Visit's Progress    Obtain Safe and Stable Transportation   On track    Care Coordination Interventions: Solution-Focused Strategies employed:  Active listening / Reflection utilized  Emotional Support Provided Caregiver stress acknowledged  Verbalization of feelings encouraged  LCSW received an update from Sarah [name] with Sarah [name]. States she needs one question clarified before completing application LCSW spoke with patient and completed ppwk LCSW reviewed upcoming appts. Pt reports that she needs transportation to PCP appt scheduled for 02/02/22 LCSW completed Care Guide referral             SDOH assessments and interventions completed:  No     Care Coordination Interventions:  Yes, provided   Follow up plan: Follow up call scheduled for 2 weeks    Encounter Outcome:  Pt. Visit Completed   Sarah [name], MSW, [name] Phone [phone] 5:59 AM

## 2022-02-02 NOTE — Telephone Encounter (Signed)
   Telephone encounter was:  Successful.  02/02/2022 Name: ALEXSIS KATHMAN MRN: [id number] DOB: 01-15-1945  SEVANNA BALLENGEE is a 77 y.o. year old female who is a primary care patient of [name], Malka So, MD . The community resource team was consulted for assistance with Transportation Needs  patient changed appt as she thought she woud not find a ride is calling back office to reschedule and will outreach when she has appt set up , also provided Information [facility] transportation and needing to contact them or [place] access for future appts  Care guide performed the following interventions: Patient provided with information about care guide support team and interviewed to confirm resource needs.  Follow Up Plan:  Client will call back  [facility] [phone] [address] , [place] [address] Email : [email or web address]

## 2022-02-02 NOTE — Patient Instructions (Signed)
Visit Information  Thank you for taking time to visit with me today. Please don't hesitate to contact me if I can be of assistance to you.   Following are the goals we discussed today:   Goals Addressed             This Visit's Progress    Obtain Safe and Stable Transportation   On track    Care Coordination Interventions: Solution-Focused Strategies employed:  Active listening / Reflection utilized  Emotional Support Provided Caregiver stress acknowledged  Verbalization of feelings encouraged  LCSW received an update from [place] with [facility]. States she needs one question clarified before completing application LCSW spoke with patient and completed ppwk LCSW reviewed upcoming appts. Pt reports that she needs transportation to PCP appt scheduled for 02/02/22 LCSW completed Care Guide referral             Our next appointment is by telephone on 02/10/22 at 10 AM  Please call the care guide team at [phone] if you need to cancel or reschedule your appointment.   If you are experiencing a Mental Health or [place] or need someone to talk to, please call the Suicide and Crisis Lifeline: 988 call 911   Patient verbalizes understanding of instructions and care plan provided today and agrees to view in [place]. Active MyChart status and patient understanding of how to access instructions and care plan via MyChart confirmed with patient.     Christa See, MSW, [email or web address] Phone [phone] 6:00 AM

## 2022-02-02 NOTE — Telephone Encounter (Signed)
Telephone encounter was:  Successful.  02/02/2022 Name: Sarah [name] MRN: [id number] DOB: [birth year]  Sarah [name] is a 77 y.o. year old female who is a primary care patient of [name], Malka So, MD . The community resource team was consulted for assistance with Transportation Needs   Care guide performed the following interventions: Patient provided with information about care guide support team and interviewed to confirm resource needs. Patient transport arranged and waiver signed , patient told future appts needed to be scheduled through her insurance transportation   Sarah [name] DOB: November 08, 1945 MRN: [id number]   RIDER WAIVER AND RELEASE OF LIABILITY  For purposes of improving physical access to our facilities, [facility] is pleased to partner with third parties to provide [facility] patients or other authorized individuals the option of convenient, on-demand ground transportation services (the [place]") through use of the technology service that enables users to request on-demand ground transportation from independent third-party providers.  By opting to use and accept these [employer], I, the undersigned, hereby agree on behalf of myself, and on behalf of any minor child using the Government social research officer for whom I am the parent or legal guardian, as follows:  Government social research officer provided to me are provided by independent third-party transportation providers who are not [employer] or employees and who are unaffiliated with [employer]. [facility] is neither a transportation carrier nor a common or public carrier. [facility] has no control over the quality or safety of the transportation that occurs as a result of the [employer]. [facility] cannot guarantee that any third-party transportation provider will complete any arranged transportation service. [facility] makes no representation, warranty, or guarantee regarding the  reliability, timeliness, quality, safety, suitability, or availability of any of the Transport Services or that they will be error free. I fully understand that traveling by vehicle involves risks and dangers of serious bodily injury, including permanent disability, paralysis, and death. I agree, on behalf of myself and on behalf of any minor child using the Transport Services for whom I am the parent or legal guardian, that the entire risk arising out of my use of the [employer] remains solely with me, to the maximum extent permitted under applicable law. The [employer] are provided "as is" and "as available." [facility] disclaims all representations and warranties, express, implied or statutory, not expressly set out in these terms, including the implied warranties of merchantability and fitness for a particular purpose. I hereby waive and release [facility], its agents, employees, officers, directors, representatives, insurers, attorneys, assigns, successors, subsidiaries, and affiliates from any and all past, present, or future claims, demands, liabilities, actions, causes of action, or suits of any kind directly or indirectly arising from acceptance and use of the [employer]. I further waive and release [facility] and its affiliates from all present and future liability and responsibility for any injury or death to persons or damages to property caused by or related to the use of the [employer]. I have read this Waiver and Release of Liability, and I understand the terms used in it and their legal significance. This Waiver is freely and voluntarily given with the understanding that my right (as well as the right of any minor child for whom I am the parent or legal guardian using the [employer]) to legal recourse against  in connection with the [employer] is knowingly surrendered in return for use of these services.  I attest that I read the  consent document to [name], gave Ms. Lamour the opportunity to ask questions and answered the questions asked (if any). I affirm that [name] then provided consent for she's participation in this program.     Bonnielee Haff                              Follow Up Plan:  No further follow up planned at this time. The patient has been provided with needed resources. [place] [phone] [address] , [place] [address] Email : Ashby Dawes. Greenauer-moran '@[email or web address]

## 2022-02-02 NOTE — Progress Notes (Unsigned)
  Care Coordination Note  02/02/2022 Name: Sarah [name] MRN: [id number] DOB: 12-14-1945  Sarah [name] is a 77 y.o. year old female who is a primary care patient of [name], Malka So, MD and is actively engaged with the care management team. I reached out to [name] by phone today to assist with re-scheduling a follow up visit with the RN Case Manager  Follow up plan: Unsuccessful telephone outreach attempt made. A HIPAA compliant phone message was left for the patient providing contact information and requesting a return call.   [place]  Direct Dial: [phone]

## 2022-02-03 NOTE — Progress Notes (Unsigned)
ACUTE VISIT Chief Complaint  Patient presents with   Hypertension   HPI: [name] is a 77 y.o. female, who is here today complaining of ***She reports that she has been receiving therapy at home and has had a couple of instances of elevated blood pressure. She states that her blood pressure has been checked every two days for the past couple of weeks, and it has been high twice. On January 17, her blood pressure was reported as 156/74 and then 140/70, accompanied by a mild headache. HPI The patient reports that she has been experiencing headaches, which have improved in frequency but still occur occasionally. She describes the headache as parietal and pressure-like, with a severity of about 4 out of 10. [name] also mentions occasional nausea, for which she takes medication. Review of Systems See other pertinent positives and negatives in HPI.  Current Outpatient Medications on File Prior to Visit  Medication Sig Dispense Refill   amLODipine (NORVASC) 5 MG tablet Take 1 tablet (5 mg total) by mouth daily. 90 tablet 3   atorvastatin (LIPITOR) 40 MG tablet Take 2 tablets (80 mg total) by mouth daily. 180 tablet 3   clopidogrel (PLAVIX) 75 MG tablet TAKE 1 TABLET(75 MG) BY MOUTH DAILY (Patient taking differently: Take 75 mg by mouth daily.) 90 tablet 1   dapagliflozin propanediol (FARXIGA) 10 MG TABS tablet Take 1 tablet (10 mg total) by mouth daily. 90 tablet 3   ezetimibe (ZETIA) 10 MG tablet Take 1 tablet (10 mg total) by mouth daily. 90 tablet 3   furosemide (LASIX) 20 MG tablet Take 1 tablet by mouth daily (only as needed) 90 tablet 3   hydrALAZINE (APRESOLINE) 10 MG tablet Take 1 tablet (10 mg total) by mouth 3 (three) times daily. 240 tablet 3   insulin glargine, 1 Unit Dial, (TOUJEO SOLOSTAR) 300 UNIT/ML Solostar Pen Inject 16 Units into the skin daily in the afternoon. (Patient taking differently: Inject 12 Units into the skin daily in the afternoon.) 15 mL 3    Insulin Pen Needle 32G X 4 MM MISC 1 Device by Does not apply route in the morning, at noon, in the evening, and at bedtime. 400 each 3   latanoprost (XALATAN) 0.005 % ophthalmic solution Place 1 drop into both eyes at bedtime.     liraglutide (VICTOZA) 18 MG/3ML SOPN Inject 1.2 mg into the skin daily.     losartan (COZAAR) 50 MG tablet TAKE 1 TABLET(50 MG) BY MOUTH DAILY 90 tablet 1   nitroGLYCERIN (NITROSTAT) 0.4 MG SL tablet Place 1 tablet (0.4 mg total) under the tongue every 5 (five) minutes as needed for chest pain. one tab every 5 minutes up to 3 tablets total over 15 minutes. 25 tablet 3   ondansetron (ZOFRAN) 4 MG tablet Take 1 tablet (4 mg total) by mouth 2 (two) times daily as needed for nausea or vomiting. 60 tablet 1   sertraline (ZOLOFT) 50 MG tablet Take 1 tablet (50 mg total) by mouth daily. 90 tablet 1   No current facility-administered medications on file prior to visit.    Past Medical History:  Diagnosis Date   Bilateral carpal tunnel syndrome 03/27/2019   Boils    Glaucoma    Heart murmur    HTN (hypertension)    Hx of adenomatous colonic polyps    Hyperlipidemia    Osteoporosis    Pneumonia    Stroke ([place] [facility])    Type II or unspecified type diabetes mellitus without  mention of complication, uncontrolled    No Known Allergies  Social History   Socioeconomic History   Marital status: Single    Spouse name: Not on file   Number of children: Not on file   Years of education: Not on file   Highest education level: Not on file  Occupational History   Not on file  Tobacco Use   Smoking status: Former    Types: Cigarettes    Quit date: 10/15/2016    Years since quitting: 5.3   Smokeless tobacco: Never   Tobacco comments:    smokes occ.   Vaping Use   Vaping Use: Never used  Substance and Sexual Activity   Alcohol use: Yes    Alcohol/week: 0.0 standard drinks of alcohol    Comment: occ   Drug use: No   Sexual activity: Not on file  Other Topics Concern    Not on file  Social History Narrative   Not on file   Social Determinants of Health   Financial Resource Strain: Low Risk  (04/29/2021)   Overall Financial Resource Strain (CARDIA)    Difficulty of Paying Living Expenses: Not hard at all  Food Insecurity: No Food Insecurity (10/30/2021)   Hunger Vital Sign    Worried About Running Out of Food in the Last Year: Never true    Ran Out of Food in the Last Year: Never true  Transportation Needs: Unmet Transportation Needs (10/30/2021)   PRAPARE - Hydrologist (Medical): Yes    Lack of Transportation (Non-Medical): No  Physical Activity: Insufficiently Active (04/29/2021)   Exercise Vital Sign    Days of Exercise per Week: 1 day    Minutes of Exercise per Session: 30 min  Stress: No Stress Concern Present (04/29/2021)   [name]    Feeling of Stress : Not at all  Social Connections: Socially Isolated (04/29/2021)   Social Connection and Isolation Panel [NHANES]    Frequency of Communication with Friends and Family: More than three times a week    Frequency of Social Gatherings with Friends and Family: More than three times a week    Attends Religious Services: Never    Marine scientist or Organizations: No    Attends Archivist Meetings: Never    Marital Status: Never married    Vitals:   02/05/22 1501  BP: 120/68  Pulse: 88  Temp: 98.7 F (37.1 C)  SpO2: 99%   Body mass index is 20.75 kg/m.  Physical Exam Vitals and nursing note reviewed.  Constitutional:      General: She is not in acute distress.    Appearance: She is well-developed.  HENT:     Head: Normocephalic and atraumatic.  Eyes:     Conjunctiva/sclera: Conjunctivae normal.  Cardiovascular:     Rate and Rhythm: Normal rate and regular rhythm.     Heart sounds: No murmur heard. Pulmonary:     Effort: Pulmonary effort is normal. No respiratory  distress.     Breath sounds: Normal breath sounds.  Abdominal:     Palpations: Abdomen is soft.     Tenderness: There is no abdominal tenderness.  Musculoskeletal:     Right lower leg: 1+ Pitting Edema present.     Left lower leg: 1+ Pitting Edema present.  Skin:    General: Skin is warm.     Findings: No erythema or rash.  Neurological:  General: No focal deficit present.     Mental Status: She is alert and oriented to person, place, and time.     Comments: Unstable gait assisted by a walker.  Psychiatric:        Mood and Affect: Mood and affect normal.   ASSESSMENT AND PLAN:  Ms.There are no diagnoses linked to this encounter.  No orders of the defined types were placed in this encounter.   No problem-specific Assessment & Plan notes found for this encounter.  No follow-ups on file.  Rozina Pointer G. [name], MD  [facility]. [place] office.  Discharge Instructions   None

## 2022-02-04 NOTE — Progress Notes (Signed)
  Care Coordination Note  02/04/2022 Name: [name] MRN: [id number] DOB: February 22, 1945  Sarah [name] is a 77 y.o. year old female who is a primary care patient of [name], Malka So, MD and is actively engaged with the care management team. I reached out to [name] by phone today to assist with re-scheduling a follow up visit with the RN Case Manager  Follow up plan: Unsuccessful telephone outreach attempt made. A HIPAA compliant phone message was left for the patient providing contact information and requesting a return call.  We have been unable to make contact with the patient for follow up. The care management team is available to follow up with the patient after provider conversation with the patient regarding recommendation for care management engagement and subsequent re-referral to the care management team.   [place]  Direct Dial: [phone]

## 2022-02-05 ENCOUNTER — Encounter: Payer: Self-pay | Admitting: Family Medicine

## 2022-02-05 ENCOUNTER — Telehealth: Payer: Self-pay | Admitting: Family Medicine

## 2022-02-05 ENCOUNTER — Ambulatory Visit (INDEPENDENT_AMBULATORY_CARE_PROVIDER_SITE_OTHER): Payer: Medicare Other | Admitting: Family Medicine

## 2022-02-05 VITALS — BP 120/68 | HR 88 | Temp 98.7°F | Resp 16 | Ht 60.0 in | Wt 106.2 lb

## 2022-02-05 DIAGNOSIS — R059 Cough, unspecified: Secondary | ICD-10-CM

## 2022-02-05 DIAGNOSIS — E1121 Type 2 diabetes mellitus with diabetic nephropathy: Secondary | ICD-10-CM | POA: Diagnosis not present

## 2022-02-05 DIAGNOSIS — R519 Headache, unspecified: Secondary | ICD-10-CM

## 2022-02-05 DIAGNOSIS — N182 Chronic kidney disease, stage 2 (mild): Secondary | ICD-10-CM | POA: Diagnosis not present

## 2022-02-05 DIAGNOSIS — I700 Atherosclerosis of aorta: Secondary | ICD-10-CM

## 2022-02-05 DIAGNOSIS — I25118 Atherosclerotic heart disease of native coronary artery with other forms of angina pectoris: Secondary | ICD-10-CM

## 2022-02-05 DIAGNOSIS — I119 Hypertensive heart disease without heart failure: Secondary | ICD-10-CM | POA: Diagnosis not present

## 2022-02-05 MED ORDER — HYDRALAZINE HCL 10 MG PO TABS: 20.0000 mg | ORAL_TABLET | Freq: Three times a day (TID) | ORAL | 3 refills | Status: DC

## 2022-02-05 NOTE — Telephone Encounter (Signed)
FYI grayson OT with [facility] is calling to report pt had missed visit on 1-24 and 02-05-2022 due to not feeling well

## 2022-02-05 NOTE — Telephone Encounter (Signed)
Noted  

## 2022-02-05 NOTE — Patient Instructions (Addendum)
A few things to remember from today's visit:  Hypertension with heart disease - Plan: hydrALAZINE (APRESOLINE) 10 MG tablet Today Hydralazine increased from 10 mg 3 times per day to 20 mg (2 tabs) 3 times per day. No changes in rest. Caution with potassium rich diet.  If you need refills for medications you take chronically, please call your pharmacy. Do not use My Chart to request refills or for acute issues that need immediate attention. If you send a my chart message, it may take a few days to be addressed, specially if I am not in the office.  Please be sure medication list is accurate. If a new problem present, please set up appointment sooner than planned today.

## 2022-02-06 NOTE — Assessment & Plan Note (Signed)
Reporting improved BS's. She is on [place]. Has f/u appt in 04/2022.

## 2022-02-06 NOTE — Assessment & Plan Note (Signed)
Follows with cardiologist, next f/u appt 03/22/22.

## 2022-02-06 NOTE — Assessment & Plan Note (Signed)
Seen again in 11/2021 on abdominal/pelvic CT.  Atorvastatin 40 mg daily.

## 2022-02-06 NOTE — Assessment & Plan Note (Addendum)
Following with nephrologist. Has blood work scheduled for next week. On [place]. Some side effects of medications discussed.

## 2022-02-06 NOTE — Assessment & Plan Note (Signed)
BP re-checked 180/80. Hydralazine increased from 10 mg tid to 20 mg tid. Continue Losartan 50 mg daily and Amlodipine 5 mg daily. Low salt diet. Monitor BP at home daily. Has appt with cardiologist in 03/2022. Instructed about warning signs.

## 2022-02-08 ENCOUNTER — Telehealth: Payer: Self-pay | Admitting: *Deleted

## 2022-02-08 NOTE — Telephone Encounter (Signed)
   Telephone encounter was:  Successful.  02/08/2022 Name: Sarah [name] MRN: [id number] DOB: 02/06/1945  [name] is a 77 y.o. year old female who is a primary care patient of [name], Malka So, MD . The community resource team was consulted for assistance with Transportation Needs   Care guide performed the following interventions: Patient provided with information about care guide support team and interviewed to confirm resource needs.  Called patient and she had forgotten to take the phone with text messages to the appt so she got the text when she got home . Have advised office that the transportation was scheduled both ways and that patient did not recall apparently how to do it . She is able to book through [place] health care transportation any future rides needs . Patient said she understood   Follow Up Plan:  No further follow up planned at this time. The patient has been provided with needed resources.  [place] [phone] [address] , [place] [address] Email : Ashby Dawes. [email or web address]-moran [email or web address]

## 2022-02-09 ENCOUNTER — Telehealth: Payer: Self-pay | Admitting: Family Medicine

## 2022-02-09 DIAGNOSIS — I119 Hypertensive heart disease without heart failure: Secondary | ICD-10-CM

## 2022-02-09 MED ORDER — HYDRALAZINE HCL 10 MG PO TABS: 20.0000 mg | ORAL_TABLET | Freq: Three times a day (TID) | ORAL | 3 refills | Status: DC

## 2022-02-09 MED ORDER — AMLODIPINE BESYLATE 5 MG PO TABS: 5.0000 mg | ORAL_TABLET | Freq: Every day | ORAL | 3 refills | Status: DC

## 2022-02-09 NOTE — Telephone Encounter (Signed)
Pt's sister called to request a refill of the following:  amLODipine (NORVASC) 5 MG tablet   Pt will be out by the end of the week.  LOV:  02/05/22  Please advise.  [id number] [address], [place] Phone: [phone]  Fax: [phone]

## 2022-02-09 NOTE — Telephone Encounter (Signed)
I spoke with Sarah [name], Rx has been sent in. Patient had some at home that she was using up first. Sarah [name] will call back with the name of pt's new mail order.

## 2022-02-09 NOTE — Telephone Encounter (Signed)
Calling to see where this prescription was sent hydrALAZINE (APRESOLINE) 10 MG tablet . Pls call [name] and advise

## 2022-02-09 NOTE — Telephone Encounter (Signed)
Rx sent 

## 2022-02-10 ENCOUNTER — Ambulatory Visit: Payer: Self-pay | Admitting: Licensed Clinical Social Worker

## 2022-02-12 NOTE — Patient Outreach (Signed)
  Care Coordination Late Entry  Follow Up Visit Note   Outreach completed 02/10/22 Name: Sarah [name] MRN: [id number] DOB: 09-Sep-1945  Sarah [name] is a 77 y.o. year old female who sees Sarah [name], Sarah So, MD for primary care. I spoke with  Sarah [name] by phone today.  What matters to the patients health and wellness today?  Transportation    Goals Addressed             This Visit's Progress    Obtain Safe and Stable Transportation   On track    Care Coordination Interventions: Solution-Focused Strategies employed:  Active listening / Reflection utilized  Emotional Support Provided Caregiver stress acknowledged  Verbalization of feelings encouraged  LCSW received an update from Sarah [name] with [facility]. States she needs one question clarified before completing application LCSW spoke with patient and completed ppwk LCSW reviewed upcoming appts. Pt reports that she needs transportation to PCP appt scheduled for 02/02/22 LCSW completed Care Guide referral             SDOH assessments and interventions completed:  No     Care Coordination Interventions:  Yes, provided   Follow up plan: Follow up call scheduled for 2-4 weeks    Encounter Outcome:  Pt. Visit Completed   Sarah [name], MSW, Sarah [name] Phone [phone] 5:27 AM

## 2022-02-12 NOTE — Patient Instructions (Signed)
Visit Information  Thank you for taking time to visit with me today. Please don't hesitate to contact me if I can be of assistance to you.   Following are the goals we discussed today:   Goals Addressed             This Visit's Progress    Obtain Safe and Stable Transportation   On track    Care Coordination Interventions: Solution-Focused Strategies employed:  Active listening / Reflection utilized  Emotional Support Provided Caregiver stress acknowledged  Verbalization of feelings encouraged  LCSW received an update from [place] with [facility]. States she needs one question clarified before completing application LCSW spoke with patient and completed ppwk LCSW reviewed upcoming appts. Pt reports that she needs transportation to PCP appt scheduled for 02/02/22 LCSW completed Care Guide referral             Our next appointment is by telephone on 03/01/22 at 10 AM  Please call the care guide team at [phone] if you need to cancel or reschedule your appointment.   If you are experiencing a Mental Health or [place] or need someone to talk to, please call the Suicide and Crisis Lifeline: 988 call 911   Patient verbalizes understanding of instructions and care plan provided today and agrees to view in [place]. Active MyChart status and patient understanding of how to access instructions and care plan via MyChart confirmed with patient.     Christa See, MSW, [email or web address] Phone [phone] 5:28 AM

## 2022-02-14 ENCOUNTER — Other Ambulatory Visit: Payer: Self-pay | Admitting: Family Medicine

## 2022-02-14 DIAGNOSIS — F419 Anxiety disorder, unspecified: Secondary | ICD-10-CM

## 2022-02-15 ENCOUNTER — Telehealth: Payer: Self-pay | Admitting: Licensed Clinical Social Worker

## 2022-02-16 ENCOUNTER — Telehealth: Payer: Self-pay | Admitting: Family Medicine

## 2022-02-16 DIAGNOSIS — I119 Hypertensive heart disease without heart failure: Secondary | ICD-10-CM

## 2022-02-16 DIAGNOSIS — E782 Mixed hyperlipidemia: Secondary | ICD-10-CM

## 2022-02-16 DIAGNOSIS — F419 Anxiety disorder, unspecified: Secondary | ICD-10-CM

## 2022-02-16 MED ORDER — CLOPIDOGREL BISULFATE 75 MG PO TABS: 75.0000 mg | ORAL_TABLET | Freq: Every day | ORAL | 1 refills | Status: DC

## 2022-02-16 MED ORDER — AMLODIPINE BESYLATE 5 MG PO TABS: 5.0000 mg | ORAL_TABLET | Freq: Every day | ORAL | 3 refills | Status: DC

## 2022-02-16 MED ORDER — SERTRALINE HCL 50 MG PO TABS: ORAL_TABLET | ORAL | 1 refills | Status: DC

## 2022-02-16 MED ORDER — HYDRALAZINE HCL 10 MG PO TABS: 20.0000 mg | ORAL_TABLET | Freq: Three times a day (TID) | ORAL | 3 refills | Status: DC

## 2022-02-16 MED ORDER — ATORVASTATIN CALCIUM 40 MG PO TABS: 80.0000 mg | ORAL_TABLET | Freq: Every day | ORAL | 3 refills | Status: DC

## 2022-02-16 MED ORDER — LOSARTAN POTASSIUM 50 MG PO TABS: ORAL_TABLET | ORAL | 1 refills | Status: DC

## 2022-02-16 MED ORDER — EZETIMIBE 10 MG PO TABS: 10.0000 mg | ORAL_TABLET | Freq: Every day | ORAL | 3 refills | Status: DC

## 2022-02-16 NOTE — Patient Instructions (Signed)
Visit Information  Thank you for taking time to visit with me today. Please don't hesitate to contact me if I can be of assistance to you.   Following are the goals we discussed today:   Goals Addressed             This Visit's Progress    Obtain Safe and Stable Transportation   On track    Care Coordination Interventions: Solution-Focused Strategies employed:  Active listening / Reflection utilized  Emotional Support Provided Caregiver stress acknowledged  Verbalization of feelings encouraged  LCSW emailed Courtney with [facility] regarding pt's request for an update on application Patient has upcoming medical appts noting transportation barriers. LCSW received confirmation from insurance provider that they do not offer transportation to medical appts LCSW will await response and update patient at next appt             Our next appointment is by telephone on 03/01/22 at 10 AM  Please call the care guide team at [phone] if you need to cancel or reschedule your appointment.   If you are experiencing a Mental Health or [place] or need someone to talk to, please call the Suicide and Crisis Lifeline: 988 call 911   Patient verbalizes understanding of instructions and care plan provided today and agrees to view in [place]. Active MyChart status and patient understanding of how to access instructions and care plan via MyChart confirmed with patient.     Christa See, MSW, [email or web address] Phone [phone] 12:47 PM

## 2022-02-16 NOTE — Addendum Note (Signed)
Addended by: Rodrigo Ran on: 02/16/2022 03:35 PM   Modules accepted: Orders

## 2022-02-16 NOTE — Telephone Encounter (Signed)
Rxs sent

## 2022-02-16 NOTE — Patient Outreach (Signed)
  Care Coordination   Collaboration  Visit Note   02/15/22 Name: Sarah [name] MRN: [id number] DOB: [birth year]  Sarah [name] is a 77 y.o. year old female who sees [name], Malka So, MD for primary care. I emailed representative for [facility]  What matters to the patients health and wellness today?  Patient was not contacted during this encounter   Goals Addressed             This Visit's Progress    Obtain Safe and Stable Transportation   On track    Care Coordination Interventions: Solution-Focused Strategies employed:  Active listening / Reflection utilized  Emotional Support Provided Caregiver stress acknowledged  Verbalization of feelings encouraged  LCSW emailed Courtney with [facility] regarding pt's request for an update on application Patient has upcoming medical appts noting transportation barriers. LCSW received confirmation from insurance provider that they do not offer transportation to medical appts LCSW will await response and update patient at next appt             SDOH assessments and interventions completed:  No     Care Coordination Interventions:  Yes, provided   Follow up plan: Follow up call scheduled for 2 weeks    Encounter Outcome:  Pt. Visit Completed   Christa See, MSW, [email or web address] Phone [phone] 12:47 PM

## 2022-02-16 NOTE — Telephone Encounter (Signed)
Noted  

## 2022-02-16 NOTE — Telephone Encounter (Signed)
Pt sister peggy is calling and would like [facility] rxs [facility] and they will coordinator with [facility] to refill medications on file

## 2022-02-16 NOTE — Telephone Encounter (Signed)
Pt sister peggy is calling and would like medications to [facility] once rx's needs to be refill. Pt is not out of medication yet

## 2022-02-19 ENCOUNTER — Telehealth: Payer: Self-pay | Admitting: Licensed Clinical Social Worker

## 2022-02-22 NOTE — Patient Outreach (Signed)
  Care Coordination   Follow Up Visit Note   02/09 Name: Sarah [name] MRN: [id number] DOB: [birth year]  Sarah [name] is a 77 y.o. year old female who sees [name], Malka So, MD for primary care. I spoke with  [name] by phone today.  What matters to the patients health and wellness today?  Transportation    Goals Addressed             This Visit's Progress    Obtain Safe and Stable Transportation   On track    Care Coordination Interventions: Solution-Focused Strategies employed:  Active listening / Reflection utilized  Emotional Support Provided Caregiver stress acknowledged  Verbalization of feelings encouraged  LCSW received response from [place] with [facility] stating she will review pt's application on 1/27-5/17 LCSW provided update to patient. She was appreciative for update and has no concerns at this time Patient has upcoming medical appts noting transportation barriers. LCSW received confirmation from insurance provider that they do not offer transportation to medical appts. Will collaborate with Ashby Dawes, [facility] Care Guide about availability for additional transportation needs             SDOH assessments and interventions completed:  No     Care Coordination Interventions:  Yes, provided   Follow up plan: Follow up call scheduled for 03/01/22    Encounter Outcome:  Pt. Visit Completed   Christa See, MSW, [email or web address] Phone [phone] 9:00 AM

## 2022-02-22 NOTE — Patient Instructions (Signed)
Visit Information  Thank you for taking time to visit with me today. Please don't hesitate to contact me if I can be of assistance to you.   Following are the goals we discussed today:   Goals Addressed             This Visit's Progress    Obtain Safe and Stable Transportation   On track    Care Coordination Interventions: Solution-Focused Strategies employed:  Active listening / Reflection utilized  Emotional Support Provided Caregiver stress acknowledged  Verbalization of feelings encouraged  LCSW received response from [place] with [employer] [facility] stating she will review pt's application on [id number] LCSW provided update to patient. She was appreciative for update and has no concerns at this time Patient has upcoming medical appts noting transportation barriers. LCSW received confirmation from insurance provider that they do not offer transportation to medical appts. Will collaborate with Ashby Dawes Lily about availability for additional transportation needs             Our next appointment is by telephone on 03/01/22 at 10 AM  Please call the care guide team at [phone] if you need to cancel or reschedule your appointment.   If you are experiencing a Mental Health or [place] or need someone to talk to, please call the Suicide and Crisis Lifeline: 988 call 911   Patient verbalizes understanding of instructions and care plan provided today and agrees to view in [place]. Active MyChart status and patient understanding of how to access instructions and care plan via MyChart confirmed with patient.     Christa See, MSW, [email or web address] Phone [phone] 9:01 AM

## 2022-02-24 ENCOUNTER — Other Ambulatory Visit: Payer: Self-pay | Admitting: Internal Medicine

## 2022-02-24 ENCOUNTER — Telehealth: Payer: Self-pay

## 2022-02-24 MED ORDER — FARXIGA 10 MG PO TABS: 10.0000 mg | ORAL_TABLET | Freq: Every day | ORAL | 3 refills | Status: DC

## 2022-03-01 ENCOUNTER — Ambulatory Visit: Payer: Self-pay | Admitting: Licensed Clinical Social Worker

## 2022-03-01 NOTE — Patient Instructions (Signed)
Visit Information  Thank you for taking time to visit with me today. Please don't hesitate to contact me if I can be of assistance to you.   Following are the goals we discussed today:   Goals Addressed             This Visit's Progress    Obtain Safe and Stable Transportation   On track    Care Coordination Interventions: Solution-Focused Strategies employed:  Active listening / Reflection utilized  Emotional Support Provided Caregiver stress acknowledged  Verbalization of feelings encouraged  LCSW collaborated with Care Guide, Jeanine, regarding pt's ongoing transportation needs. Informed her that pt's [employer] application is still pending and her insurance does not provide assistance with transportation. LCSW was provided [facility] Concierge # [phone] for patient to call with scheduling needs LCSW spoke with patient, she has not heard from [place] with [employer] LCSW provided [facility] Concierge # to patient. She agreed to schedule transportation for upcoming appts, which were reviewed LCSW will f/up with patient, per request, to ensure scheduling was completed              Our next appointment is by telephone on 03/05/22 at 9:30 AM  Please call the care guide team at [phone] if you need to cancel or reschedule your appointment.   If you are experiencing a Mental Health or [place] or need someone to talk to, please call the Suicide and Crisis Lifeline: 988 call 911   Patient verbalizes understanding of instructions and care plan provided today and agrees to view in [place]. Active MyChart status and patient understanding of how to access instructions and care plan via MyChart confirmed with patient.     Christa See, MSW, [email or web address] Phone [phone] 4:37 PM

## 2022-03-01 NOTE — Patient Outreach (Signed)
  Care Coordination   Follow Up Visit Note   03/01/2022 Name: Sarah [name] MRN: [id number] DOB: [birth year]  Sarah [name] is a 78 y.o. year old female who sees [name], Malka So, MD for primary care. I spoke with  [name] by phone today.  What matters to the patients health and wellness today?  Transportation    Goals Addressed             This Visit's Progress    Obtain Safe and Stable Transportation   On track    Care Coordination Interventions: Solution-Focused Strategies employed:  Active listening / Reflection utilized  Emotional Support Provided Caregiver stress acknowledged  Verbalization of feelings encouraged  LCSW collaborated with Care Guide, Jeanine, regarding pt's ongoing transportation needs. Informed her that pt's [employer] [place] application is still pending and her insurance does not provide assistance with transportation. LCSW was provided [facility] Concierge # [phone] for patient to call with scheduling needs LCSW spoke with patient, she has not heard from [place] with [employer] [facility] LCSW provided [facility] Concierge # to patient. She agreed to schedule transportation for upcoming appts, which were reviewed LCSW will f/up with patient, per request, to ensure scheduling was completed              SDOH assessments and interventions completed:  No     Care Coordination Interventions:  Yes, provided   Follow up plan: Follow up call scheduled for 1 week    Encounter Outcome:  Pt. Visit Completed   Christa See, MSW, [email or web address] Phone [phone] 4:35 PM

## 2022-03-02 ENCOUNTER — Telehealth: Payer: Self-pay | Admitting: Family Medicine

## 2022-03-02 NOTE — Telephone Encounter (Signed)
[place]a PT with [facility] is calling  VO extension PT 1x7

## 2022-03-02 NOTE — Telephone Encounter (Signed)
VO given to [place].

## 2022-03-05 ENCOUNTER — Ambulatory Visit: Payer: Self-pay | Admitting: Licensed Clinical Social Worker

## 2022-03-05 NOTE — Telephone Encounter (Signed)
duplicate

## 2022-03-08 ENCOUNTER — Telehealth: Payer: Self-pay | Admitting: Licensed Clinical Social Worker

## 2022-03-08 NOTE — Patient Outreach (Signed)
  Care Coordination   Follow Up Visit Note   Encounter completed on 03/05/22 Name: Sarah [name] MRN: [id number] DOB: 1945/11/12  Sarah [name] is a 77 y.o. year old female who sees [name], Malka So, MD for primary care. I spoke with  [name] by phone today.  What matters to the patients health and wellness today?  [employer] Transportation    Goals Addressed             This Visit's Progress    Obtain Safe and Stable Transportation   On track    Care Coordination Interventions: Solution-Focused Strategies employed:  Active listening / Reflection utilized  Emotional Support Provided Caregiver stress acknowledged  Verbalization of feelings encouraged  LCSW collaborated with Care Guide, Jeanine, regarding pt's ongoing transportation needs. Informed her that pt's [employer] [place] application is still pending and her insurance does not provide assistance with transportation. LCSW was provided [facility] Concierge # [phone] for patient to call with scheduling needs LCSW spoke with patient, she has not heard from [place] with [employer] [facility] LCSW provided [facility] Concierge # to patient. She agreed to schedule transportation for upcoming appts, which were reviewed LCSW will f/up with patient, per request, to ensure scheduling was completed              SDOH assessments and interventions completed:  No     Care Coordination Interventions:  Yes, provided   Follow up plan: Follow up call scheduled for 1-2 weeks    Encounter Outcome:  Pt. Visit Completed   Christa See, MSW, [email or web address] Phone [phone] 5:45 PM

## 2022-03-08 NOTE — Patient Instructions (Signed)
Visit Information  Thank you for taking time to visit with me today. Please don't hesitate to contact me if I can be of assistance to you.   Following are the goals we discussed today:   Goals Addressed             This Visit's Progress    Obtain Safe and Stable Transportation   On track    Care Coordination Interventions: Solution-Focused Strategies employed:  Active listening / Reflection utilized  Emotional Support Provided Caregiver stress acknowledged  Verbalization of feelings encouraged  LCSW collaborated with Care Guide, Jeanine, regarding pt's ongoing transportation needs. Informed her that pt's [employer] [place] application is still pending and her insurance does not provide assistance with transportation. LCSW was provided [facility] Concierge # [phone] for patient to call with scheduling needs LCSW spoke with patient, she has not heard from [place] with [employer] [facility] LCSW provided [facility] Concierge # to patient. She agreed to schedule transportation for upcoming appts, which were reviewed LCSW will f/up with patient, per request, to ensure scheduling was completed             Please call the care guide team at [phone] if you need to cancel or reschedule your appointment.   If you are experiencing a Mental Health or [place] or need someone to talk to, please call the Suicide and Crisis Lifeline: 988 call 911   Patient verbalizes understanding of instructions and care plan provided today and agrees to view in [place]. Active MyChart status and patient understanding of how to access instructions and care plan via MyChart confirmed with patient.     Christa See, MSW, [email or web address] Phone [phone] 5:46 PM

## 2022-03-08 NOTE — Progress Notes (Unsigned)
[facility]  PATIENT: Sarah [name] DOB: December 12, 1945    REASON FOR VISIT: Follow-up for stroke right MCA 10/2016 readmitted 02/08/2018 for right parieto-occipital stroke  HISTORY FROM: Patient, sister and chart  Chief complaint: Chief Complaint  Patient presents with   Follow-up    Pt with sister, rm 82. Here for follow up. Pt has been having worsening with vision and has noticed increase in headaches. Has upcoming apt in April with eye MD. She is working OT once a week and graduated from walker to the cane.       HPI:  Update 03/09/2022 JM: Overall stable from stroke standpoint without any new or worsening stroke/TIA symptoms.  Reports residual gait difficulty and imbalance, has improved some since prior visit, has graduated from walker to cane. Still feels unsteady at times when ambulating. Working with therapy at home. Denies any recent falls.   Continues to experience headaches, have been every other day if not daily. Can occur mid afternoon typically, located frontal and behind eyes. Questions if due to her vision, can feel eye fatigue/strain after reading for prolonged period of time or watching television, has not noticed worsening of headaches around this time. Has f/u with eye doctor in April. Denies taking OTC medications to help with headaches.  She also continues to complain of daytime fatigue.  Compliant on Plavix, atorvastatin and Zetia - currently prescribed 2x '40mg'$  atorvastatin tablets (due to difficulty swallowing '80mg'$  tablet) but believes only take '40mg'$  per day Blood pressure typically well controlled, slightly on higher side today, monitored by [facility] therapy and has been stable Routinely follows with PCP and endocrinology Cardiac monitor negative for atrial fibrillation     History provided for reference purposes only Update 11/04/2021 JM: Patient returns for recent hospitalization.  She is accompanied by her sister. She presented to ED on  10/13 with acute encephalopathy with memory lapses and feeling unsteady/wobbly with walking and found to have punctate left cerebellar and left parietal lobe infarctions likely incidental finding on MRI during encephalopathy work-up, etiology unclear possibly cardioembolic pattern.  MRA negative LVO, bilateral M1 segment stenosis.  Carotid Doppler unremarkable.  EEG normal.  EF 60 to 65%.  Recommend 30-day event monitor to evaluate for A-fib, previously had loop recorder without evidence of A-fib.  LDL 227.  A1c 12.6.  Recommended DAPT for 3 weeks then Plavix alone and as well as continuation of home dose Zetia and atorvastatin and recommended follow-up outpatient with lipid clinic for further lipid management.  Also advised outpatient PCP follow-up for better DM control.  She was evaluated by therapies, initially recommended CIR but due to improvement during hospitalization, recommendations changed to outpatient therapy and 24/7 care/supervision.   Reports since discharge, she has been experiencing generalized weakness sensation with increased fatigue and awakening about 3 times per week with left-sided headaches, these usually get better throughout the day, will use Tylenol if needed with benefit (denies overuse).  She also reports some visual changes in her left eye, has appt with ophthalmologist Dr. Brigitte Pulse on Monday.  She denies any specific weakness or speech difficulty.  Does have some gait impairment and currently ambulating with RW, denies any recent falls.  She is living at home but family has been providing 24/7 care/assistance.  She is scheduled to start outpatient therapies tomorrow.  Denies any new stroke/TIA symptoms.  She remains on aspirin and Plavix as well as atorvastatin and Zetia, denies side effects -family has been assisting with ensuring medication compliance Blood pressure  today 141/72 She has since been seen by PCP and endocrinology Has follow-up visit with cardiology 11/28 - reports  she has not heard anything in regards to the cardiac monitor   Update 09/11/2020 JM:, returns for requested visit due to c/o worsening balance over the past 2 months and gradual memory decline. Worsening knee pain after a near fall in June - seen by ortho for left knee arthritis and bursitis. Limiting ambulation due to pain.  She is able to ambulate without assistive device.  Mild decline of memory reported but able to maintain ADLs and IADLs independently.  Sedentary lifestyle without any type of memory or cognitive exercises.  Denies any specific weakness, numbness/tingling, speech difficulty or confusion.  Currently on aspirin, plavix, zetia and atorvastatin for secondary stroke prevention and hx of CAD. Blood pressure today 143/72.  Glucose levels routinely monitored which has been stable.  No further concerns at this time.  Update 06/21/2019:, Sarah [name] is being seen for stroke follow-up unaccompanied.  She has been stable from a stroke standpoint without new or worsening stroke/TIA symptoms.  Prior complaints of worsening imbalance, left arm numbness/tingling and speech difficulty with MRI and MRA stable without acute abnormalities.  She reports ongoing improvement and continues to do HEP as recommended by therapy at home.  EMG/NCV completed for left arm numbness/tingling with mild bilateral carpal tunnel syndrome but otherwise unremarkable.  Reports left arm symptoms resolved as well as left-sided neck symptoms.  Continues on clopidogrel and atorvastatin for secondary stroke prevention.  Recently added Zetia by PCP due to recent lipid panel showing LDL 115.  Blood pressure today 146/72.  Recent A1c 12.3 with recent adjustments of insulin regimen and continuation of Metformin and Victoza by PCP.  Continues to follow routinely with PCP for HTN, HLD and DM management.  Loop recorder has not shown atrial fibrillation thus far.  No concerns at this time.  Update 02/13/2019: Sarah [name] is a 77 year old  female who is being seen today for stroke follow-up.  She has been stable from a stroke standpoint with residual mild stable left homonymous hemianopsia, mild imbalance which she believes has been slightly worsening over the past couple of months along with possible worsening of prior aphasia from stroke in 2018.  She does admit to limited physical activity and does not have an established exercise regimen along with endorsing increased stress currently caring for her mother with increased illness and increased stress due to overall COVID-19 pandemic.  She has recently signed up for [employer] program which will be starting in March.  She also states for the past 2 weeks, she has been experiencing left arm numbness/tingling but will resolve quickly and symptoms intermittent.  She does endorse left-sided neck stiffness that radiates into her upper trapezius and increased pain when looking towards the left.  Denies any increased weakness.  Unable to determine if numbness/tingling symptoms occur in certain positions.  She has not previously experienced this sensation.  Denies any other new or worsening neurological symptoms.  She did have bilateral cataract surgery in 0000000 without complication. Continues on Plavix and atorvastatin for secondary stroke prevention without side effects.  Blood pressure today 130/60.  Continues to follow with PCP for DM management with recent A1c 9.7.  Loop recorder is not shown atrial fibrillation thus far.  Monitoring/battery life will be completed 10/2019.  She has received her first dose of Covid vaccination and plans on receiving second dose next week.  Denies new or worsening stroke/TIA symptoms.  No  further concerns at this time.  06/27/2018 VIRTUAL VISIT: Sarah [name] is being seen today for three-month follow-up visit regarding recent stroke.  Initially scheduled for office visit follow-up but due to COVID-19 safety precautions, visit transition to telemedicine via  MyChart with patient's consent.  She has been stable from a stroke standpoint with residual left homonymous hemianopsia and balance difficulties.  She continues to participate in outpatient PT with ongoing improvement.  Continues on Plavix and atorvastatin for secondary stroke prevention without reported side effects. Blood pressure monitored at home.  Lab work obtained yesterday with A1c 10.3 (prior 13.5).  She continues to follow with PCP for ongoing management.  Loop recorder has not shown atrial fibrillation thus far.  No further concerns at this time.  Denies new or worsening stroke/TIA symptoms.   Hospital follow-up 03/23/2018 CM: Sarah [name], 77 year old female returns for follow-up with readmission for stroke 1/29/ 2020.  MRI of the brain acute cortical and subcortical RPO infarcts.  She initially presented with left hemianopsia.  She is now on Plavix and aspirin for 3 months and then Plavix alone.  She has not had further stroke or TIA symptoms.  She has minimal bruising and no bleeding.  Hemoglobin A1c 13.5 on hospital admission.  She claims she is trying to get that down to a more reasonable level.  She remains on Lipitor without myalgias.  Blood pressure in the office today 146/87.  She continues to go to [facility].  She is to start rehab physical therapy and occupational therapy today.  Loop recorder so far no atrial fibrillation.  She is not driving due to her vision.  She returns for reevaluation  Hospital summary: right parieto-occipital infarcts in setting of progressive R MCA stenosis, infarcts secondary to large vessel disease source Resultant L hemianopia  CT head R parietal watershed infarct. Small vessel disease.  MRI  Acute cortical and subcortical R P-O infarcts. Old R CR and R frontal lobe infarcts. Small vessel disease.  MRA head  No LVO. Focal stenosis R M1, R M2/M3, L M1 MRA neck B VA origin atherosclerosis 30-50% 2D Echo  pending  Loop interrogation - last check 1/3  w/o AF - interrogation today without AF LDL 76 HgbA1c 13.5 Lovenox 40 mg sq daily for VTE prophylaxis clopidogrel 75 mg daily prior to admission, now on aspirin 325 mg daily and clopidogrel 75 mg daily. Continue DAPT x 3 months then plavix alone Therapy recommendations:  OP OT and PT Disposition:  Home Patient advised not to drive      REVIEW OF SYSTEMS: Full 14 system review of systems performed and notable only for those listed, all others are neg:  See HPI  ALLERGIES: No Known Allergies  HOME MEDICATIONS:   PAST MEDICAL HISTORY: Past Medical History:  Diagnosis Date   Bilateral carpal tunnel syndrome 03/27/2019   Boils    Glaucoma    Heart murmur    HTN (hypertension)    Hx of adenomatous colonic polyps    Hyperlipidemia    Osteoporosis    Pneumonia    Stroke ([place])    Type II or unspecified type diabetes mellitus without mention of complication, uncontrolled     PAST SURGICAL HISTORY: Past Surgical History:  Procedure Laterality Date   ABDOMINAL HYSTERECTOMY     CARPAL TUNNEL RELEASE     right   COLONOSCOPY  12-10-10   per Dr. Deatra Ina, clear, repeat in 7 yrs    CORONARY STENT INTERVENTION N/A 03/11/2020   Procedure:  CORONARY STENT INTERVENTION;  Surgeon: Jettie Booze, MD;  Location: [facility];  Service: Cardiovascular;  Laterality: N/A;   INCISION AND DRAINAGE PERIRECTAL ABSCESS N/A 09/26/2015   Procedure: IRRIGATION AND DEBRIDEMENT PERIRECTAL ABSCESS;  Surgeon: Mickeal Skinner, MD;  Location: [facility];  Service: General;  Laterality: N/A;   INTRAVASCULAR ULTRASOUND/IVUS N/A 03/11/2020   Procedure: Intravascular Ultrasound/IVUS;  Surgeon: Jettie Booze, MD;  Location: [facility];  Service: Cardiovascular;  Laterality: N/A;   KNEE ARTHROSCOPY     right   LEFT HEART CATH AND CORONARY ANGIOGRAPHY N/A 03/11/2020   Procedure: LEFT HEART CATH AND CORONARY ANGIOGRAPHY;  Surgeon: Jettie Booze, MD;  Location: [facility];   Service: Cardiovascular;  Laterality: N/A;   LOOP RECORDER INSERTION N/A 10/19/2016   Procedure: LOOP RECORDER INSERTION;  Surgeon: Thompson Grayer, MD;  Location: [facility];  Service: Cardiovascular;  Laterality: N/A;   ORIF ANKLE FRACTURE Right 03/24/2012   Procedure: OPEN REDUCTION INTERNAL FIXATION (ORIF) ANKLE FRACTURE;  Surgeon: Newt Minion, MD;  Location: [facility];  Service: Orthopedics;  Laterality: Right;  Open Reduction Internal Fixation Right Bimalleolar ankle fracture   POLYPECTOMY     TEE WITHOUT CARDIOVERSION N/A 10/18/2016   Procedure: TRANSESOPHAGEAL ECHOCARDIOGRAM (TEE);  Surgeon: Fay Records, MD;  Location: [facility];  Service: Cardiovascular;  Laterality: N/A;   TONSILLECTOMY      FAMILY HISTORY: Family History  Problem Relation Age of Onset   Diabetes Mother    Hypertension Mother    Stroke Father    Stroke Maternal Uncle    Stroke Paternal Uncle    Colon cancer Neg Hx    Esophageal cancer Neg Hx    Rectal cancer Neg Hx    Stomach cancer Neg Hx    Inflammatory bowel disease Neg Hx    Liver disease Neg Hx    Pancreatic cancer Neg Hx     SOCIAL HISTORY: Social History   Socioeconomic History   Marital status: Single    Spouse name: Not on file   Number of children: Not on file   Years of education: Not on file   Highest education level: Not on file  Occupational History   Not on file  Tobacco Use   Smoking status: Former    Types: Cigarettes    Quit date: 10/15/2016    Years since quitting: 5.4   Smokeless tobacco: Never   Tobacco comments:    smokes occ.   Vaping Use   Vaping Use: Never used  Substance and Sexual Activity   Alcohol use: Yes    Alcohol/week: 0.0 standard drinks of alcohol    Comment: occ   Drug use: No   Sexual activity: Not on file  Other Topics Concern   Not on file  Social History Narrative   Not on file   Social Determinants of Health   Financial Resource Strain: Low Risk  (04/29/2021)   Overall Financial  Resource Strain (CARDIA)    Difficulty of Paying Living Expenses: Not hard at all  Food Insecurity: No Food Insecurity (10/30/2021)   Hunger Vital Sign    Worried About Running Out of Food in the Last Year: Never true    Ran Out of Food in the Last Year: Never true  Transportation Needs: Unmet Transportation Needs (10/30/2021)   PRAPARE - Hydrologist (Medical): Yes    Lack of Transportation (Non-Medical): No  Physical Activity: Insufficiently Active (04/29/2021)  Exercise Vital Sign    Days of Exercise per Week: 1 day    Minutes of Exercise per Session: 30 min  Stress: No Stress Concern Present (04/29/2021)   [place]    Feeling of Stress : Not at all  Social Connections: Socially Isolated (04/29/2021)   Social Connection and Isolation Panel [NHANES]    Frequency of Communication with Friends and Family: More than three times a week    Frequency of Social Gatherings with Friends and Family: More than three times a week    Attends Religious Services: Never    Marine scientist or Organizations: No    Attends Archivist Meetings: Never    Marital Status: Never married  Intimate Partner Violence: Not At Risk (10/24/2021)   Humiliation, Afraid, Rape, and Kick questionnaire    Fear of Current or Ex-Partner: No    Emotionally Abused: No    Physically Abused: No    Sexually Abused: No     PHYSICAL EXAM  Today's Vitals   03/09/22 1407 03/09/22 1437  BP: (!) 152/79 (!) 148/68  Pulse: 91   Weight: 114 lb (51.7 kg)   Height: 5' (1.524 m)     Body mass index is 22.26 kg/m.   General: well developed, well nourished, very pleasant elderly African-American female, seated, in no evident distress Head: head normocephalic and atraumatic.   Neck: supple with no carotid or supraclavicular bruits Cardiovascular: regular rate and rhythm, no murmurs Musculoskeletal: no  deformity Skin:  no rash/petichiae Vascular:  Normal pulses all extremities   Neurologic Exam Mental Status: Awake and fully alert. Occasional stuttering of speech/speech hesitancy (chronic). Oriented to place and time. Recent memory subjectively impaired and remote memory intact. Attention span, concentration and fund of knowledge appropriate during visit. Mood and affect appropriate.  Cranial Nerves: Pupils equal, briskly reactive to light. Extraocular movements full without nystagmus. Visual fields full to confrontation testing. Hearing intact. Facial sensation intact. Face, tongue, palate moves normally and symmetrically.  Motor: Normal bulk and tone. Normal strength in all tested extremity muscles. Sensory.: intact to touch , pinprick , position and vibratory sensation.  Coordination: Rapid alternating movements normal in all extremities except slightly decreased left hand dexterity (chronic from prior stroke). Finger-to-nose and heel-to-shin performed accurately bilaterally. Gait and Station: Arises from chair without difficulty. Stance is normal. Gait demonstrates decreased stride length and step height bilaterally with mild imbalance and use of cane.  Tandem walk and heel toe not attempted. Reflexes: 1+ and symmetric. Toes downgoing.      DIAGNOSTIC DATA (LABS, IMAGING, TESTING) - I reviewed patient records, labs, notes, testing and imaging myself where available.  Lab Results  Component Value Date   WBC 10.2 11/19/2021   HGB 11.7 (L) 11/19/2021   HCT 36.8 11/19/2021   MCV 97.4 11/19/2021   PLT 376 11/19/2021      Component Value Date/Time   NA 144 12/08/2021 1351   K 4.1 12/08/2021 1351   CL 106 12/08/2021 1351   CO2 24 12/08/2021 1351   GLUCOSE 115 (H) 12/08/2021 1351   GLUCOSE 101 (H) 11/19/2021 1436   BUN 15 12/08/2021 1351   CREATININE 0.85 12/08/2021 1351   CREATININE 0.55 (L) 10/24/2019 1602   CALCIUM 9.8 12/08/2021 1351   PROT 6.2 09/22/2020 0958   ALBUMIN 1.6  (L) 10/26/2021 0445   ALBUMIN 3.5 (L) 09/22/2020 0958   AST 20 09/22/2020 0958   ALT 17 09/22/2020 [id number]  ALKPHOS 99 09/22/2020 0958   BILITOT <0.2 09/22/2020 0958   GFRNONAA 41 (L) 11/19/2021 1436   GFRNONAA 94 10/24/2019 1602   GFRAA 103 03/04/2020 1025   GFRAA 109 10/24/2019 1602   Lab Results  Component Value Date   CHOL 362 (H) 10/24/2021   HDL 93 10/24/2021   LDLCALC 227 (H) 10/24/2021   TRIG 210 (H) 10/24/2021   CHOLHDL 3.9 10/24/2021   Lab Results  Component Value Date   HGBA1C 12.6 (H) 10/25/2021        ASSESSMENT AND PLAN    Ms. DIANDRA HEDEEN is a 77 y.o. female with history of recent punctate left cerebellar and left parietal lobe infarcts in 10/2021 likely incidental finding on MRI during encephalopathy work-up of unclear etiology, hx of right parieto-occipital infarcts in setting of progressive right MCA stenosis in 2020 likely secondary to large vessel disease source and hx of right MCA stroke in 2018 s/p ILR without evidence of A-fib during 4 year monitoring duration    Ischemic strokes -continue plavix, lipitor and Zetia for secondary stroke prevention.  -repeat lipid panel today - if LDL remains elevated, will advise patient to ensure she is taking atorvastatin '80mg'$  daily (currently only taking '40mg'$ ) or consider switching to a different statin - request ongoing monitoring/management of these levels and medications by PCP -Cardiac monitor negative for atrial fibrillation -Continue close PCP, endocrinology and cardiology follow-up for aggressive stroke risk factor management maintain blood pressure goal <130/80, diabetes with hemoglobin A1c goal below 7.0% and lipids with LDL cholesterol goal below 70 mg/dL.    Headaches Excessive daytime fatigue At risk for sleep apnea -Recommend initiating topiramate 25 mg nightly -advised to call after 1 to 2 weeks if no benefit or sooner if difficulty tolerating -encouraged f/u with eye doctor as  scheduled -headaches present since stroke in 10/2021, if these should continue to worsen or change in characteristics, may need to consider repeat imaging at that time -Referral placed for sleep consult -will check B12 level today -Referral placed to [place] sleep clinic to evaluate for possible underlying sleep apnea in setting of recurrent strokes, excessive daytime fatigue, headaches and nocturia     Follow-up in 6 months or call earlier if needed   I spent 34 minutes of face-to-face and non-face-to-face time with patient and sister.  This included previsit chart review, lab review, study review, order entry, electronic health record documentation, patient and sister education and discussion as documented above    Frann Rider, [facility]  [facility] [address] [place], [place] [address]  Phone [phone] Fax [phone] Note: This document was prepared with digital dictation and possible smart phrase technology. Any transcriptional errors that result from this process are unintentional.

## 2022-03-09 ENCOUNTER — Encounter: Payer: Self-pay | Admitting: Adult Health

## 2022-03-09 ENCOUNTER — Ambulatory Visit: Payer: Medicare Other | Admitting: Adult Health

## 2022-03-09 VITALS — BP 148/68 | HR 91 | Ht 60.0 in | Wt 114.0 lb

## 2022-03-09 DIAGNOSIS — R29818 Other symptoms and signs involving the nervous system: Secondary | ICD-10-CM

## 2022-03-09 DIAGNOSIS — E785 Hyperlipidemia, unspecified: Secondary | ICD-10-CM | POA: Diagnosis not present

## 2022-03-09 DIAGNOSIS — E538 Deficiency of other specified B group vitamins: Secondary | ICD-10-CM

## 2022-03-09 DIAGNOSIS — Z8673 Personal history of transient ischemic attack (TIA), and cerebral infarction without residual deficits: Secondary | ICD-10-CM | POA: Diagnosis not present

## 2022-03-09 DIAGNOSIS — G44221 Chronic tension-type headache, intractable: Secondary | ICD-10-CM

## 2022-03-09 NOTE — Patient Instructions (Signed)
Visit Information  Thank you for taking time to visit with me today. Please don't hesitate to contact me if I can be of assistance to you.   Following are the goals we discussed today:   Goals Addressed             This Visit's Progress    Obtain Safe and Stable Transportation   On track    Care Coordination Interventions: Solution-Focused Strategies employed:  Active listening / Reflection utilized  Emotional Support Provided Caregiver stress acknowledged  Verbalization of feelings encouraged  LCSW collaborated with Care Guide, Jeanine, regarding pt's ongoing transportation needs. Informed her that pt's [employer] [place] application is still pending and her insurance does not provide assistance with transportation. LCSW was provided [facility] Concierge # [phone] for patient to call with scheduling needs LCSW spoke with patient, she has not heard from [place] with [employer] [place] LCSW provided [facility] Concierge # to patient. She agreed to schedule transportation for upcoming appts, which were reviewed LCSW will f/up with patient, per request, to ensure scheduling was completed              Our next appointment is by telephone on 03/17/22 at 1 PM  Please call the care guide team at [phone] if you need to cancel or reschedule your appointment.   If you are experiencing a Mental Health or [place] or need someone to talk to, please call the Suicide and Crisis Lifeline: 988 call 911   Patient verbalizes understanding of instructions and care plan provided today and agrees to view in [place]. Active MyChart status and patient understanding of how to access instructions and care plan via MyChart confirmed with patient.     Christa See, MSW, [email or web address] Phone [phone] 6:10 PM

## 2022-03-09 NOTE — Patient Instructions (Addendum)
Recommend starting topiramate '25mg'$  nightly for headaches - let me know after a couple week if no benefit for need of dosage increase or sooner if difficulty tolerating   Follow up with eye doctor as scheduled in April   Continue Plavix, atorvastatin and zetia for secondary stroke prevention measures   We will check cholesterol levels and B12 levels today  You will be contacted by our sleep clinic to schedule a sleep evaluation      Follow up in 6 months or call earlier if needed

## 2022-03-09 NOTE — Patient Outreach (Signed)
  Care Coordination   Follow Up Visit Note   Encounter completed on 03/08/22 Name: Sarah [name] MRN: [id number] DOB: [birth year]  Sarah [name] is a 77 y.o. year old female who sees [name], Malka So, MD for primary care. I spoke with  Deliah Boston Remigio's sister, [name], by phone today.  What matters to the patients health and wellness today?  Transportation Patient continues to maintain positive progress with goals..       Goals Addressed             This Visit's Progress    Obtain Safe and Stable Transportation   On track    Care Coordination Interventions: Solution-Focused Strategies employed:  Active listening / Reflection utilized  Emotional Support Provided Caregiver stress acknowledged  Verbalization of feelings encouraged  LCSW collaborated with Care Guide, Jeanine, regarding pt's ongoing transportation needs. Informed her that pt's [facility] [place] application is still pending and her insurance does not provide assistance with transportation. LCSW was provided [facility] Concierge # [phone] for patient to call with scheduling needs LCSW spoke with patient, she has not heard from [place] with [facility] [place] LCSW provided [facility] Concierge # to patient. She agreed to schedule transportation for upcoming appts, which were reviewed LCSW will f/up with patient, per request, to ensure scheduling was completed              SDOH assessments and interventions completed:  Yes  SDOH Interventions Today    Flowsheet Row Most Recent Value  SDOH Interventions   Transportation Interventions Patient Resources (Friends/Family), SCAT (Specialized Community Area Transporation), Other (Comment)  [facility] Transport ([facility])]        Care Coordination Interventions:  Yes, provided  Interventions Today    Flowsheet Row Most Recent Value  Chronic Disease   Chronic disease during today's visit Hypertension (HTN), Diabetes, Chronic Kidney Disease/End Stage Renal Disease (ESRD)   General Interventions   General Interventions Discussed/Reviewed General Interventions Reviewed, Community Resources  Mental Health Interventions   Mental Health Discussed/Reviewed Mental Health Reviewed       Follow up plan: Follow up call scheduled for 2-4 weeks    Encounter Outcome:  Pt. Visit Completed   Christa See, MSW, [email or web address] Phone [phone] 6:09 PM

## 2022-03-10 ENCOUNTER — Other Ambulatory Visit: Payer: Self-pay

## 2022-03-10 LAB — VITAMIN B12: Vitamin B-12: 384 pg/mL (ref 232–1245)

## 2022-03-10 LAB — LIPID PANEL
Chol/HDL Ratio: 1.8 ratio (ref 0.0–4.4)
Cholesterol, Total: 166 mg/dL (ref 100–199)
HDL: 90 mg/dL (ref >39)
LDL Chol Calc (NIH): 63 mg/dL (ref 0–99)
Triglycerides: 70 mg/dL (ref 0–149)
VLDL Cholesterol Cal: 13 mg/dL (ref 5–40)

## 2022-03-10 NOTE — Telephone Encounter (Signed)
Patient sister will stop by office to pick up sample of Dexcom

## 2022-03-15 ENCOUNTER — Telehealth: Payer: Self-pay | Admitting: Adult Health

## 2022-03-15 ENCOUNTER — Other Ambulatory Visit: Payer: Self-pay

## 2022-03-15 DIAGNOSIS — E782 Mixed hyperlipidemia: Secondary | ICD-10-CM

## 2022-03-15 DIAGNOSIS — I119 Hypertensive heart disease without heart failure: Secondary | ICD-10-CM

## 2022-03-15 MED ORDER — TOPIRAMATE 25 MG PO TABS: 25.0000 mg | ORAL_TABLET | Freq: Every evening | ORAL | 1 refills | Status: DC

## 2022-03-15 MED ORDER — DEXCOM G6 SENSOR MISC: 3 refills | Status: DC

## 2022-03-15 MED ORDER — LIRAGLUTIDE 18 MG/3ML ~~LOC~~ SOPN: 1.2000 mg | PEN_INJECTOR | Freq: Every day | SUBCUTANEOUS | 1 refills | Status: DC

## 2022-03-15 MED ORDER — DEXCOM G6 TRANSMITTER MISC: 3 refills | Status: DC

## 2022-03-15 MED ORDER — TOUJEO SOLOSTAR 300 UNIT/ML ~~LOC~~ SOPN: 16.0000 [IU] | PEN_INJECTOR | Freq: Every day | SUBCUTANEOUS | 3 refills | Status: DC

## 2022-03-15 NOTE — Addendum Note (Signed)
Addended by: Gertie Baron D on: 03/15/2022 11:03 AM   Modules accepted: Orders

## 2022-03-15 NOTE — Telephone Encounter (Signed)
Pt sister [name]) called requesting Janett Billow send a prescription for topiramate 25 mg to [facility] on [address].

## 2022-03-17 ENCOUNTER — Telehealth: Payer: Self-pay | Admitting: Licensed Clinical Social Worker

## 2022-03-17 ENCOUNTER — Encounter: Payer: Self-pay | Admitting: Licensed Clinical Social Worker

## 2022-03-17 NOTE — Patient Outreach (Signed)
  Care Coordination   03/17/2022 Name: Sarah [name] MRN: [id number] DOB: [birth year]   Care Coordination Outreach Attempts:  An unsuccessful telephone outreach was attempted for a scheduled appointment today. A female answered the phone stating pt was not available. LCSW unable to leave a message due to phone disconnection.  Follow Up Plan:  Additional outreach attempts will be made to offer the patient care coordination information and services.   Encounter Outcome:  No Answer   Care Coordination Interventions:  Yes, provided   Interventions Today    Flowsheet Row Most Recent Value  General Interventions   General Interventions Discussed/Reviewed Communication with  Communication with --  [LCSW emailed [name] with [facility] [place] regarding update on application]       Christa See, MSW, [email or web address] Phone [phone] 1:12 PM

## 2022-03-18 ENCOUNTER — Telehealth: Payer: Self-pay | Admitting: Licensed Clinical Social Worker

## 2022-03-18 NOTE — Patient Outreach (Signed)
  Care Coordination   03/18/2022 Name: Sarah [name] MRN: [id number] DOB: November 24, 1945   Care Coordination Outreach Attempts:  A second unsuccessful outreach was attempted today to offer the patient with information about available care coordination services as a benefit of their health plan.     Follow Up Plan:  Additional outreach attempts will be made to offer the patient care coordination information and services.   Encounter Outcome:  No Answer   Care Coordination Interventions:  No, not indicated    Christa See, MSW, [email or web address] Phone [phone] 12:58 PM

## 2022-03-22 ENCOUNTER — Telehealth: Payer: Self-pay

## 2022-03-22 ENCOUNTER — Ambulatory Visit: Payer: Medicare Other | Admitting: Internal Medicine

## 2022-03-22 NOTE — Progress Notes (Signed)
Patient ID: Sarah [name], female   DOB: 1945-07-18, 77 y.o.   MRN: [id number]  Care Management & Coordination Services Pharmacy Team  Reason for Encounter: Chart Prep for initial encounter with Clementeen Hoof D on 03/26/22 @ 2 pm in office.   Spoke with patient on 03/22/2022   Have you seen any other providers since your last visit? Patient reports none  Any changes in your medications or health? Patient reports no  Any side effects from any medications? Patient reports no  Do you have an symptoms or problems not managed by your medications? Patient reports none  Any concerns about your health right now? Patient reports her inability to drive due to her eyesight is a concern for her.  Has your provider asked that you check blood pressure, blood sugar, or follow special diet at home? Patient reports she has the required equipment to check her sugars and blood pressures but she has therapy every Thursday and allow them to check for her , she reports she will bring that information to the appointment with her.  Do you get any type of exercise on a regular basis? Patient reports she is using a Environmental consultant, but does chair, stair exercises at home with therapy.  Do you have any problems getting your medications? Patient reports not at this time.    Chart review:  Recent office visits:  02/05/22 [name], [name], MD - Patient presented for Hypertension with heart disease and other concerns. Increased Hydralazine.   11/20/21 [name], [name], MD - Patient presented for Constipation unspecified and other concerns. Recommended Miralax daily or Bisacodly PRN if no resolve from constipation.  11/17/21 Dorothyann Peng, NP - Patient presented for Vomiting without nausea unspecified vomiting. No medication changes.   11/09/21 Danice Goltz (Ophthalmology) - Claims encounter for Unspecified optic neuritis and other concerns No other visit details available.  11/02/21 [name], [name], MD -  Patient presented for Hypertension with heart disease and other concerns. Stopped Lantus.   10/20/21 [name], [name], MD - Patient presented for Acute pain of right lower extremity and other concerns. No medication changes.   10/19/21 [name], [name], MD - Patient presented for Arhtralgia unspecified. No medication changes.   09/29/21 [name], [name], MD - Patient presented for Bilateral low back pain unspecified chronicity and other concerns.   Recent consult visits:  03/09/22 Frann Rider, NP (Neurology) - Patient presented for History of multiple strokes and other concerns. No medication changes.   02/19/22 Christa See D, LCSW [facility]) - Outreach with social work for transportation goal. No medication changes.  01/15/22 Persons, Bevely Palmer, PA (Ortho) - Patient presented for Chronic bilateral low back pain unspecified whether sciatica present.  No medication changes.   12/11/21 [name], [name], MD - Patient presented for CKD stage 2 and other concerns. No medication changes.   12/08/21 Dennis Bast, OT - Patient presented for Muscle weakness and other concerns. No medication changes.   12/08/21 Excell Seltzer, PT - Patient presented for Muscle weakness and other concerns. No medication changes.   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 No medication changes.  11/04/21 Frann Rider, NP (Neurology) - Patient presented for History of multiple strokes and other concerns. No medication changes.   11/03/21 Shamleffer, Melanie Crazier, MD (Endo) - Patient presented for Type 2 diabetes mellitus with hyperglycemia and other concerns. Restarted on Victoza  & Humalog, Decreased Toujeo,  Increased Humalog.  10/11/21 Imogene Burn, PA-C (Cardiology) - Patient presented for Bilateral lower extremity edema and other concerns. No medication changes.   10/07/21  Imogene Burn, PA-C (Cardiology) - Patient  presented for Hypertension and other concerns. Decreased Amlodipine. Prescribed Hydralazine  10/01/21 Mansouraty, Telford Nab., MD [facility]) - Patient presented for HX of adenomatous colonic polyp and other concerns. Prescribed Zofran.    Hospital visits:  Medication Reconciliation was completed by comparing discharge summary, patient's EMR and Pharmacy list, and upon discussion with patient.  Patient presented to [facility] ED [facility] on 11/19/21 due to Generalized weakness and other concerns. Patient was present for 6 hours.  New?Medications Started at [facility] Discharge:?? -started  none  Medication Changes at Hospital Discharge: -Changed  none  Medications Discontinued at Hospital Discharge: -Stopped None  Medications that remain the same after Hospital Discharge:??  -All other medications will remain the same.    Patient presented to [facility] on 10/23/21 due to Change in Mental status.  Patient was present for 5 days.  New?Medications Started at [facility] Discharge:?? -started  aspirin EC  Medication Changes at Hospital Discharge: -Changed  HumaLOG KwikPen (insulin lispro) Toujeo SoloStar (insulin glargine (1 Unit Dial))  Medications Discontinued at Hospital Discharge: -Stopped None  Medications that remain the same after Hospital Discharge:??  -All other medications will remain the same.   Fill History : AMLODIPINE BESYLATE '5MG'$  TABLETS 01/25/2022 90   ATORVASTATIN '40MG'$  TABLETS 11/14/2021 30   DAPAGLIFLOZIN '10MG'$  TABLETS 02/12/2022 30   EZETIMIBE '10MG'$  TABLETS 02/07/2022 90   KERENDIA  10 MG TABS 03/02/2022 30   FUROSEMIDE '20MG'$  TABLETS 01/24/2022 90   GABAPENTIN  100 MG CAPS 11/25/2021 30   HYDRALAZINE 10 MG TABLETS (ORANGE) 02/10/2022 90   TOUJEO SOLOSTAR  300 UNIT/ML SOPN 11/03/2021 30   HUMALOG 200 U/ML KWIKPEN INJ 3ML 10/31/2021 30   B-D NANO 2ND GEN PEN NDL [id number] 01/24/2022 50   VICTOZA '18MG'$ /3ML INJ PEN 3ML(3PACK)  02/28/2022 30   LOSARTAN '50MG'$  TABLETS 01/05/2022 90   SERTRALINE '50MG'$  TABLETS 02/15/2022 90   TOPIRAMATE '25MG'$  TABLETS 03/15/2022 30   Star Rating Drugs:  Atorvastatin 40 mg - Last filled 11/14/21 30 DS at [place] 10 mg - Last filled 02/12/22 30 DS at [place] '18mg'$  / 29m - [facility] [place] - Overdue Zoster Vaccine - Overdue Foot Exam -Overdue COVID Booster - Overdue Diabetic Urine - Overdue Mammogram - Overdue Eye Exam - Overdue AWV - 04/29/21      LNed ClinesCLa VillitaClinical Pharmacist Assistant [phone]

## 2022-03-22 NOTE — Progress Notes (Cosign Needed)
A user error has taken place: encounter opened in error, closed for administrative reasons.

## 2022-03-23 NOTE — Progress Notes (Addendum)
Care Management & Coordination Services Pharmacy Note  03/26/2022 Name:  Sarah [name] MRN:  [id number] DOB:  April 12, 1945  Summary: A1C not at goal <7 (12.6 in October), did not bring BG log to appt to assess BP at goal today in office <130/80 LDL at goal <70 but admits to noncompliance with lipitor due to difficulty swallowing No osteoporosis treatment (DEXA scan 10/30/20 showed RFN -2.6)  Recommendations/Changes made from today's visit: -Counseled on low-carb diet and being mindful to not drink sugars (juices) -Requested access to Dexcom G6 sugar readings as patient is not able to present a log today, awaiting patient response -Continue to check BP at home once daily -STOP atorvastatin 40mg  and START rosuvastatin 20mg  with PCP approval, as pill is significantly smaller and round -START Vit D 1000 once daily OTC and make sure to continue regular calcium intake in diet (goal of 1200mg /day).  Follow up plan: DM/HLD call in 1 month DM call in 2 months Pharmacist visit in 3 months   Subjective: Sarah [name] is an 77 y.o. year old female who is a primary patient of [name], Malka So, MD.  The care coordination team was consulted for assistance with disease management and care coordination needs.    Engaged with patient face to face for initial visit.  Recent office visits: 02/05/22 [name], [name], MD - Patient presented for Hypertension with heart disease and other concerns. Increased Hydralazine.    11/20/21 [name], [name], MD - Patient presented for Constipation unspecified and other concerns. Recommended Miralax daily or Bisacodly PRN if no resolve from constipation.   11/17/21 Dorothyann Peng, NP - Patient presented for Vomiting without nausea unspecified vomiting. No medication changes.    11/09/21 Danice Goltz (Ophthalmology) - Claims encounter for Unspecified optic neuritis and other concerns No other visit details available.   11/02/21 [name], [name],  MD - Patient presented for Hypertension with heart disease and other concerns. Stopped Lantus.    10/20/21 [name], [name], MD - Patient presented for Acute pain of right lower extremity and other concerns. No medication changes.    10/19/21 [name], [name], MD - Patient presented for Arhtralgia unspecified. No medication changes.    09/29/21 [name], [name], MD - Patient presented for Bilateral low back pain unspecified chronicity and other concerns.     Recent consult visits: 03/09/22 Frann Rider, NP (Neurology) - Patient presented for History of multiple strokes and other concerns. No medication changes.    02/19/22 Christa See D, LCSW [facility]) - Outreach with social work for transportation goal. No medication changes.   01/15/22 Persons, Bevely Palmer, PA (Ortho) - Patient presented for Chronic bilateral low back pain unspecified whether sciatica present.  No medication changes.    12/11/21 [name], [name], MD - Patient presented for CKD stage 2 and other concerns. No medication changes.    12/08/21 Dennis Bast, OT - Patient presented for Muscle weakness and other concerns. No medication changes.    12/08/21 Excell Seltzer, PT - Patient presented for Muscle weakness and other concerns. No medication changes.    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 No medication changes.   11/04/21 Frann Rider, NP (Neurology) - Patient presented for History of multiple strokes and other concerns. No medication changes.    11/03/21 Shamleffer, Melanie Crazier, MD (Endo) - Patient presented for Type 2 diabetes mellitus with hyperglycemia and other concerns. Restarted on Victoza  &  Humalog, Decreased Toujeo, Increased Humalog.   10/11/21 Imogene Burn, PA-C (Cardiology) - Patient presented for Bilateral lower extremity edema and other concerns. No medication changes.    10/07/21  Imogene Burn, PA-C  (Cardiology) - Patient presented for Hypertension and other concerns. Decreased Amlodipine. Prescribed Hydralazine   10/01/21 Mansouraty, Telford Nab., MD [facility]) - Patient presented for HX of adenomatous colonic polyp and other concerns. Prescribed [facility] visits: 11/19/21 [facility] - For generalized weakness, LOS 6 hours, no med changes  10/23/21 [place] altered mental status, LOS 5 days. Started Aspirin, Changed Humalog, Toujeo   Objective:  Lab Results  Component Value Date   CREATININE 0.85 12/08/2021   BUN 15 12/08/2021   GFR 83.50 01/21/2020   EGFR 72 12/08/2021   GFRNONAA 41 (L) 11/19/2021   GFRAA 103 03/04/2020   NA 144 12/08/2021   K 4.1 12/08/2021   CALCIUM 9.8 12/08/2021   CO2 24 12/08/2021   GLUCOSE 115 (H) 12/08/2021    Lab Results  Component Value Date/Time   HGBA1C 12.6 (H) 10/25/2021 07:04 AM   HGBA1C 11.7 (A) 08/31/2021 12:19 PM   HGBA1C >15.0 05/01/2021 01:32 PM   HGBA1C 12.9 (A) 12/25/2020 11:03 AM   HGBA1C 12.5 (H) 08/01/2019 09:45 AM   FRUCTOSAMINE 331 (H) 08/01/2019 09:45 AM   FRUCTOSAMINE 286 (H) 06/26/2018 10:27 AM   GFR 83.50 01/21/2020 04:29 PM   GFR 131.56 04/27/2019 11:00 AM   MICROALBUR 96.5 (H) 04/27/2019 11:00 AM   MICROALBUR 326.6 (H) 11/27/2018 11:58 AM    Last diabetic Eye exam:  Lab Results  Component Value Date/Time   HMDIABEYEEXA No Retinopathy 07/22/2015 12:00 AM    Last diabetic Foot exam: No results found for: "HMDIABFOOTEX"   Lab Results  Component Value Date   CHOL 166 03/09/2022   HDL 90 03/09/2022   LDLCALC 63 03/09/2022   TRIG 70 03/09/2022   CHOLHDL 1.8 03/09/2022       Latest Ref Rng & Units 10/26/2021    4:45 AM 10/25/2021    7:06 AM 09/16/2021   12:00 AM  Hepatic Function  Albumin 3.5 - 5.0 g/dL 1.6  1.6  2.7         This result is from an external source.    Lab Results  Component Value Date/Time   TSH 1.591 10/24/2021 05:11 AM   TSH 1.67 08/01/2019 09:45 AM   TSH 1.15  03/06/2015 09:12 AM   FREET4 1.0 08/01/2019 09:45 AM       Latest Ref Rng & Units 11/19/2021    2:36 PM 10/25/2021    7:04 AM 10/24/2021    5:11 AM  CBC  WBC 4.0 - 10.5 K/uL 10.2  8.0  11.1   Hemoglobin 12.0 - 15.0 g/dL 11.7  11.2  11.9   Hematocrit 36.0 - 46.0 % 36.8  34.1  35.2   Platelets 150 - 400 K/uL 376  352  343     Lab Results  Component Value Date/Time   VD25OH 39.17 06/26/2018 10:27 AM   VD25OH 20.16 (L) 12/19/2017 10:18 AM   VITAMINB12 384 03/09/2022 02:45 PM   VITAMINB12 234 10/24/2021 05:11 AM    Clinical ASCVD: Yes  The ASCVD Risk score (Arnett DK, et al., 2019) failed to calculate for the following reasons:   The patient has a prior MI or stroke diagnosis       02/05/2022    3:12 PM 11/20/2021   12:42 PM 11/02/2021  3:26 PM  Depression screen PHQ 2/9  Decreased Interest 0 0 1  Down, Depressed, Hopeless 0 0 1  PHQ - 2 Score 0 0 2  Altered sleeping  1 0  Tired, decreased energy  1 3  Change in appetite  1 0  Feeling bad or failure about yourself   0 0  Trouble concentrating  1 3  Moving slowly or fidgety/restless  1 0  Suicidal thoughts  0 0  PHQ-9 Score  5 8  Difficult doing work/chores  Somewhat difficult Somewhat difficult     Social History   Tobacco Use  Smoking Status Former   Types: Cigarettes   Quit date: 10/15/2016   Years since quitting: 5.4  Smokeless Tobacco Never  Tobacco Comments   smokes occ.    BP Readings from Last 3 Encounters:  03/26/22 118/64  03/09/22 (!) 148/68  02/05/22 120/68   Pulse Readings from Last 3 Encounters:  03/26/22 87  03/09/22 91  02/05/22 88   Wt Readings from Last 3 Encounters:  03/09/22 114 lb (51.7 kg)  02/05/22 106 lb 4 oz (48.2 kg)  12/08/21 108 lb 3.2 oz (49.1 kg)   BMI Readings from Last 3 Encounters:  03/09/22 22.26 kg/m  02/05/22 20.75 kg/m  12/08/21 21.13 kg/m    No Known Allergies  Medications Reviewed Today     Reviewed by Maren Reamer, RPH (Pharmacist) on 03/26/22  at [place] List Status: <None>   Medication Order Taking? Sig Documenting Provider Last Dose Status Informant  amLODipine (NORVASC) 5 MG tablet [id number] Yes Take 1 tablet (5 mg total) by mouth daily. [name], [name], MD Taking Active   atorvastatin (LIPITOR) 40 MG tablet [id number]  Take 2 tablets (80 mg total) by mouth daily. [name], [name], MD  Active   clopidogrel (PLAVIX) 75 MG tablet [id number] Yes Take 1 tablet (75 mg total) by mouth daily. [name], [name], MD Taking Active   Continuous Blood Gluc Sensor (DEXCOM G6 SENSOR) [place] [id number] Yes Change sensors every 10 days Shamleffer, Melanie Crazier, MD Taking Active   Continuous Blood Gluc Transmit (DEXCOM G6 TRANSMITTER) MISC [id number] Yes Change every 90 days Shamleffer, Melanie Crazier, MD Taking Active   ezetimibe (ZETIA) 10 MG tablet [id number] Yes Take 1 tablet (10 mg total) by mouth daily. [name], [name], MD Taking Active   FARXIGA 10 MG TABS tablet [id number] Yes Take 1 tablet (10 mg total) by mouth daily. Shamleffer, Melanie Crazier, MD Taking Active   Finerenone [facility]) 10 MG TABS [id number] Yes Take 10 mg by mouth daily. Justin Mend, MD Taking Active Self  furosemide (LASIX) 20 MG tablet [id number] Yes Take 1 tablet by mouth daily (only as needed) Imogene Burn, PA-C Taking Active   hydrALAZINE (APRESOLINE) 10 MG tablet [id number] Yes Take 2 tablets (20 mg total) by mouth 3 (three) times daily. [name], [name], MD Taking Active   insulin glargine, 1 Unit Dial, (TOUJEO SOLOSTAR) 300 UNIT/ML Solostar Pen [id number] Yes Inject 16 Units into the skin daily in the afternoon. Shamleffer, Melanie Crazier, MD Taking Active   Insulin Pen Needle 32G X 4 MM MISC [id number] Yes 1 Device by Does not apply route in the morning, at noon, in the evening, and at bedtime. Shamleffer, Melanie Crazier, MD Taking Active   latanoprost (XALATAN) 0.005 % ophthalmic solution [id number] Yes Place 1 drop into both eyes at bedtime. [provider]  Taking Active Self  liraglutide ([place]) 18 MG/3ML SOPN [id number] Yes Inject  1.2 mg into the skin daily. Shamleffer, Melanie Crazier, MD Taking Active   losartan (COZAAR) 50 MG tablet [id number] Yes TAKE 1 TABLET(50 MG) BY MOUTH DAILY [name], [name], MD Taking Active   nitroGLYCERIN (NITROSTAT) 0.4 MG SL tablet [id number] Yes Place 1 tablet (0.4 mg total) under the tongue every 5 (five) minutes as needed for chest pain. one tab every 5 minutes up to 3 tablets total over 15 minutes. Werner Lean, MD Taking Active Self           Med Note Harvel Ricks   [id number] Oct 24, 2021  8:13 AM) Has not used. Keeps on hand PRN  ondansetron (ZOFRAN) 4 MG tablet [id number] Yes Take 1 tablet (4 mg total) by mouth 2 (two) times daily as needed for nausea or vomiting. [name], [name], MD Taking Active   sertraline (ZOLOFT) 50 MG tablet [id number] Yes TAKE 1 TABLET(50 MG) BY MOUTH DAILY [name], [name], MD Taking Active   topiramate (TOPAMAX) 25 MG tablet [id number] Yes Take 1 tablet (25 mg total) by mouth at bedtime. Frann Rider, NP Taking Active             SDOH:  (Social Determinants of Health) assessments and interventions performed: Yes SDOH Interventions    [place] Coordination from 03/26/2022 in [place] Telephone from 03/25/2022 in [place] Telephone from 03/08/2022 in [place] Coordination from 10/30/2021 in [facility] to Hosp-Admission (Discharged) from 10/23/2021 in [place] [facility] Office Visit from 10/07/2021 in [facility] [place] Interventions Intervention Not Indicated Intervention Not Indicated -- Intervention Not Indicated -- --  Housing Interventions Intervention Not Indicated Intervention Not Indicated -- Intervention Not Indicated -- --   Transportation Interventions -- Intervention Not Indicated, Patient Resources (Friends/Family), SCAT (Specialized Community Area Transporation), Payor Benefit  [LCSW informed pt and family that she has been approved for SCAT. Patient will continue utilizing [place] prior to requesting [employer] transportation benefits] Patient Resources (Friends/Family), SCAT ([place]), Other (Comment)  [facility] Transport ([facility])] Other (Comment)  [patient has a social work referral to address. Sister [name] is aware of the insurance transportation benefit] Inpatient TOC, [id number] Referral Taxi Voucher Given       Medication Assistance: None required.  Patient affirms current coverage meets needs.  Medication Access: Within the past 30 days, how often has patient missed a dose of medication? Unclear, pt admits to nonadherence due to pill fatigue, difficulty swallowing (atorvastatin) Is a pillbox or other method used to improve adherence? Yes  Factors that may affect medication adherence? nonadherence to medications and difficulty swallowing Are meds synced by current pharmacy? No  Are meds delivered by current pharmacy? Yes  Does patient experience delays in picking up medications due to transportation concerns? No   Upstream Services Reviewed: Is patient disadvantaged to use [facility] Pharmacy?: Yes  Current Rx insurance plan: [facility] Name and location of Current pharmacy:  [place] [place] [address] Phone: [phone] Fax: [phone]  [facility] Pharmacy services reviewed with patient today?: No  Patient requests to transfer care to Upstream Pharmacy?: No  Reason patient declined to change pharmacies: Disadvantaged due to insurance/mail order  Compliance/Adherence/Medication fill history: Care Gaps: TDAP - Overdue Zoster Vaccine -  Overdue - Pt declines Foot Exam  -Overdue COVID Booster - Overdue - Pt wants to get [facility] Diabetic Urine - Overdue Mammogram - Overdue Eye Exam - Overdue AWV - 04/29/21 - scheduled for 05/03/22    Star-Rating Drugs: Atorvastatin 40mg  PDC 75% Farxiga 10mg  PDC 69% Victoza PDC 73% Losartan 50mg  PDC 82%      Patient admits to not taking everything daily as she should. States sometimes she just gets tired of taking so many peoples and has a hard time swallowing the statin  Assessment/Plan   Hypertension (BP goal <130/80) -Not ideally controlled - BP at goal in office today 118/64 HR 87 -Current treatment: Amlodipine 5mg  1 qd  Hydralazine 10mg  2 tabs TID Losartan 50mg  1 qd Lasix 20mg  1 qd prn -Medications previously tried: Lisinopril, Metoprolol, Valsartan/HCTZ -Current home readings: Checking daily, no log brought to appt today -Current dietary habits: meals prepared by home aide, see DM section -Current exercise habits: limited, walks with cane after a stroke -Denies hypotensive/hypertensive symptoms -Educated on BP goals and benefits of medications for prevention of heart attack, stroke and kidney damage; Daily salt intake goal < 2300 mg; Importance of home blood pressure monitoring; Proper BP monitoring technique; -Counseled to monitor BP at home daily, document, and provide log at future appointments -Counseled on diet and exercise extensively Recommended to continue current medication  Hyperlipidemia: (LDL goal < 70) -Controlled -Current treatment: Atorvastatin 40mg  1 qd Appropriate, Query Effective Ezetimibe 10mg  1 qd Appropriate, Effective, Safe, Accessible -Medications previously tried: Simvastatin  -Current dietary patterns: see DM section -Current exercise habits: see above -Educated on Cholesterol goals;  Benefits of statin for ASCVD risk reduction; Importance of limiting foods high in cholesterol; -Educated on importance of adherence to medication to prevent stroke and protect  kidney function. Patient states she has a hard time swallowing statin. -STOP atorvastatin. START rosuvastatin 20mg  once daily as pill is smaller and should be easier to swallow (pending PCP approval)  Diabetes (A1c goal <7%) -Uncontrolled -Current medications: Victoza 1.2mg  into skin daily Appropriate, Query Effective Toujeo 16 units daily in afternoon Appropriate, Effective, Safe, Accessible Farxiga 10mg  1 qd Appropriate, Effective, Safe, Accessible -Medications previously tried: Musician, Novolog, Lantus, Metformin -Current home glucose readings Does not check fasting, forgets Reports sugars normally in 170s post-meal Uses Dexcom G6 but does not have device with her so unable to confirm readings with a log Invite sent to patient email for access to readings, pt states she will approve -Denies hypoglycemic/hyperglycemic symptoms -Current meal patterns:  breakfast: honey nut cheerios with bananas  lunch: sandwich and soup  dinner: varies, last night was baked chicken and veggies snacks: Not discussed drinks: water, sweet tea with splenda, juices -Current exercise: see above -Educated on A1c and blood sugar goals; Complications of diabetes including kidney damage, retinal damage, and cardiovascular disease; Proper insulin injection technique; Continuous glucose monitoring; -Counseled to check feet daily and get yearly eye exams -Counseled on diet and exercise extensively Recommended to continue current medication -Reminded patient of next endo appt on 4/29. Will see what A1C is at that time and how regimen may be adjusted  CAD (Goal: Slow progression of atherosclerosis (plaques / blockages) throughout your body to reduce risk of heart attack and strokes) [place] controlled/uncontrolled: Controlled Current Medication Therapy:  Plavix 75mg  1 qd Appropriate, Effective, Safe, Accessible  Nitroglycerin 0.4mg  prn Appropriate, Effective, Safe, Accessible Medications Previously Tried:  None Chest Pain in last 3 months: No Any signs of bleed? No Counseled on: -Watching  for signs of bleed, continue medication therapy  Depression/Anxiety  -Not assessed today -Current treatment: Sertaline 50mg  1 qd Appropriate, Effective, Safe, Accessible  Osteoporosis / Osteopenia (Goal Prevent bone fracture) -Not ideally controlled -Last DEXA Scan: 10/30/20   T-Score femoral neck: -2.6  T-Score forearm radius: -3.5 -Patient is a candidate for pharmacologic treatment due to T-Score < -2.5 in femoral neck -Current treatment  None -Medications previously tried: None  -Recommend [phone] units of vitamin D daily. Recommend 1200 mg of calcium daily from dietary and supplemental sources. - START Vitamin D 1000 units daily OTC, remind patient of DEXA scan due 10/31/2022  CKD Stage II(Goal: Prevent progression of kidney disease) -Not assessed today -Current treatment  Kerendia 10mg  1 qd Appropriate, Effective, Safe, Accessible -Checked all medications for renal dose adjustment needs, none at this time  GERD/Nausea  -Not assessed today -Current treatment  Zofran 4mg  1 BID prn Appropriate, Effective, Safe, Accessible  Maren Reamer Clinical Pharmacist [phone]

## 2022-03-25 ENCOUNTER — Telehealth: Payer: Self-pay | Admitting: Licensed Clinical Social Worker

## 2022-03-25 NOTE — Patient Outreach (Signed)
  Care Coordination   Follow Up Visit Note   03/25/2022 Name: Sarah [name] MRN: [id number] DOB: 12/09/1945  Sarah [name] is a 77 y.o. year old female who sees [name], Malka So, MD for primary care. I spoke with  Sarah [name] and sister, Sarah [name], by phone today.  What matters to the patients health and wellness today?  [facility] [place]    Goals Addressed             This Visit's Progress    COMPLETED: Obtain Safe and Stable Transportation   On track    Activities and task to complete in order to accomplish goals.   LCSW will update pt on results from collaboration with [employer] [place] regarding application Continue utilizing [place] for medical transportation needs Contact LCSW and/or PCP with any additional resource needs               SDOH assessments and interventions completed:  Yes  SDOH Interventions Today    Flowsheet Row Most Recent Value  SDOH Interventions   Food Insecurity Interventions Intervention Not Indicated  Housing Interventions Intervention Not Indicated  Transportation Interventions Intervention Not Indicated, Patient Resources (Friends/Family), SCAT (Specialized Community Area Transporation), Payor Benefit  [LCSW informed pt and family that she has been approved for SCAT. Patient will continue utilizing [place] prior to requesting [facility] transportation benefits]        Care Coordination Interventions:  Yes, provided  Interventions Today    Flowsheet Row Most Recent Value  Chronic Disease   Chronic disease during today's visit Hypertension (HTN), Diabetes, Chronic Kidney Disease/End Stage Renal Disease (ESRD)  General Interventions   General Interventions Discussed/Reviewed General Interventions Reviewed  [Reviewed upcoming medical appts and inquired about barriers (none identified)]       Follow up plan: No further intervention required.   Encounter Outcome:  Pt. Visit Completed   Sarah [name], MSW,  [email or web address] Phone [phone] 3:31 PM

## 2022-03-25 NOTE — Patient Instructions (Signed)
Visit Information  Thank you for taking time to visit with me today. Please don't hesitate to contact me if I can be of assistance to you.   Following are the goals we discussed today:   Goals Addressed             This Visit's Progress    COMPLETED: Obtain Safe and Stable Transportation   On track    Activities and task to complete in order to accomplish goals.   LCSW will update pt on results from collaboration with [employer] [place] regarding application Continue utilizing [place] for medical transportation needs Contact LCSW and/or PCP with any additional resource needs               If you are experiencing a Mental Health or [place] or need someone to talk to, please call the Suicide and Crisis Lifeline: 988 call 911   Patient verbalizes understanding of instructions and care plan provided today and agrees to view in [place]. Active MyChart status and patient understanding of how to access instructions and care plan via MyChart confirmed with patient.     No further follow up required: Pt completed all goals associated with LCSW  Christa See, MSW, [email or web address] Phone [phone] 3:32 PM

## 2022-03-26 ENCOUNTER — Ambulatory Visit: Payer: Medicare Other

## 2022-04-05 ENCOUNTER — Ambulatory Visit (INDEPENDENT_AMBULATORY_CARE_PROVIDER_SITE_OTHER): Payer: Medicare Other | Admitting: Neurology

## 2022-04-05 ENCOUNTER — Encounter: Payer: Self-pay | Admitting: Neurology

## 2022-04-05 VITALS — BP 119/51 | HR 84 | Ht 60.0 in | Wt 113.4 lb

## 2022-04-05 DIAGNOSIS — Z9189 Other specified personal risk factors, not elsewhere classified: Secondary | ICD-10-CM

## 2022-04-05 DIAGNOSIS — R0683 Snoring: Secondary | ICD-10-CM

## 2022-04-05 DIAGNOSIS — R351 Nocturia: Secondary | ICD-10-CM

## 2022-04-05 DIAGNOSIS — Z820 Family history of epilepsy and other diseases of the nervous system: Secondary | ICD-10-CM

## 2022-04-05 DIAGNOSIS — Z8673 Personal history of transient ischemic attack (TIA), and cerebral infarction without residual deficits: Secondary | ICD-10-CM

## 2022-04-05 DIAGNOSIS — G4719 Other hypersomnia: Secondary | ICD-10-CM | POA: Diagnosis not present

## 2022-04-05 DIAGNOSIS — R519 Headache, unspecified: Secondary | ICD-10-CM

## 2022-04-05 NOTE — Patient Instructions (Signed)
Thank you for choosing [facility] for your sleep related care! It was nice to meet you today!   Here is what we discussed today:    Based on your symptoms and your exam I believe you are at risk for obstructive sleep apnea (aka OSA). We should proceed with a sleep study to determine whether you do or do not have OSA and how severe it is. Even, if you have mild OSA, I may want you to consider treatment with CPAP, as treatment of even borderline or mild sleep apnea can result and improvement of symptoms such as sleep disruption, daytime sleepiness, nighttime bathroom breaks, restless leg symptoms, improvement of headache syndromes, even improved mood disorder.   As explained, an attended sleep study (meaning you get to stay overnight in the sleep lab), lets us monitor sleep-related behaviors such as sleep talking and leg movements in sleep, in addition to monitoring for sleep apnea.  A home sleep test is a screening tool for sleep apnea diagnosis only, but unfortunately, does not help with any other sleep-related diagnoses.  Please remember, the long-term risks and ramifications of untreated moderate to severe obstructive sleep apnea may include (but are not limited to): increased risk for cardiovascular disease, including congestive heart failure, stroke, difficult to control hypertension, treatment resistant obesity, arrhythmias, especially irregular heartbeat commonly known as A. Fib. (atrial fibrillation); even type 2 diabetes has been linked to untreated OSA.   Other correlations that untreated obstructive sleep apnea include macular edema which is swelling of the retina in the eyes, droopy eyelid syndrome, and elevated hemoglobin and hematocrit levels (often referred to as polycythemia).  Sleep apnea can cause disruption of sleep and sleep deprivation in most cases, which, in turn, can cause recurrent headaches, problems with memory, mood, concentration, focus, and vigilance. Most  people with untreated sleep apnea report excessive daytime sleepiness, which can affect their ability to drive. Please do not drive or use heavy equipment or machinery, if you feel sleepy! Patients with sleep apnea can also develop difficulty initiating and maintaining sleep (aka insomnia).   Having sleep apnea may increase your risk for other sleep disorders, including involuntary behaviors sleep such as sleep terrors, sleep talking, sleepwalking.    Having sleep apnea can also increase your risk for restless leg syndrome and leg movements at night.   Please note that untreated obstructive sleep apnea may carry additional perioperative morbidity. Patients with significant obstructive sleep apnea (typically, in the moderate to severe degree) should receive, if possible, perioperative PAP (positive airway pressure) therapy and the surgeons and particularly the anesthesiologists should be informed of the diagnosis and the severity of the sleep disordered breathing.   We will call you or email you through MyChart with regards to your test results and plan a follow-up in sleep clinic accordingly. Most likely, you will hear from one of our nurses.   Our sleep lab administrative assistant will call you to schedule your sleep study and give you further instructions, regarding the check in process for the sleep study, arrival time, what to bring, when you can expect to leave after the study, etc., and to answer any other logistical questions you may have. If you don't hear back from her by about 2 weeks from now, please feel free to call her direct line at [phone] or you can call our general clinic number, or email us through My Chart.   

## 2022-04-05 NOTE — Progress Notes (Signed)
Subjective:    Patient ID: Sarah [name] is a 77 y.o. female.  HPI    Star Age, MD, PhD [facility] [address] [place], [place] [address]  Dear Janett Billow and Mamie Nick,  I saw your patient, Sarah [name], upon your kind request in my sleep clinic today for initial consultation of her sleep disorder, in particular, concern for underlying obstructive sleep apnea.  The patient is unaccompanied today.  As you know, Sarah [name] is a 77 year old female with an underlying medical history of multiple strokes, including stroke of the left cerebellum and left parietal lobe in October 2023, and prior right MCA territory and right cerebellar infarcts, type 2 diabetes, hyperlipidemia, osteoporosis, hypertension, glaucoma, carpal tunnel syndrome, history of pneumonia, and colonic polyps, who reports snoring and excessive daytime somnolence, as well as nocturia and morning headaches.  Her Epworth sleepiness score is 14 out of 24, fatigue severity score is 39 out of 63.  I reviewed your office note from 11/04/2021.  She lives with her mom, she reports that her 23 year old mom has sleep apnea as well.  Patient has no kids, no pets in the household.  Bedtime is generally between 9:30 PM and 10 PM and rise time between 8 AM and 8:30 AM.  She is retired from an Data processing manager position and [employer].  She does not drive since her strokes and uses transportation.  She has nocturia about 2-3 times per average night and occasionally wakes up with a headache which is typically dull, achy, in the bifrontal areas.  She had a tonsillectomy as a child.  She has not had any cigarettes in for 5 years, she quit drinking alcohol after she had her first stroke.  She drinks limited caffeine in the form of coffee, usually 1 cup in the morning, occasional soda.  She does have a TV in her bedroom and it tends to stay on all night.  Her Past Medical History Is Significant For: Past  Medical History:  Diagnosis Date   Bilateral carpal tunnel syndrome 03/27/2019   Boils    Glaucoma    Heart murmur    HTN (hypertension)    Hx of adenomatous colonic polyps    Hyperlipidemia    Osteoporosis    Pneumonia    Stroke ([place])    Type II or unspecified type diabetes mellitus without mention of complication, uncontrolled     Her Past Surgical History Is Significant For: Past Surgical History:  Procedure Laterality Date   ABDOMINAL HYSTERECTOMY     CARPAL TUNNEL RELEASE     right   COLONOSCOPY  12-10-10   per Dr. Deatra Ina, clear, repeat in 7 yrs    CORONARY STENT INTERVENTION N/A 03/11/2020   Procedure: CORONARY STENT INTERVENTION;  Surgeon: Jettie Booze, MD;  Location: [facility];  Service: Cardiovascular;  Laterality: N/A;   INCISION AND DRAINAGE PERIRECTAL ABSCESS N/A 09/26/2015   Procedure: IRRIGATION AND DEBRIDEMENT PERIRECTAL ABSCESS;  Surgeon: Mickeal Skinner, MD;  Location: [place];  Service: General;  Laterality: N/A;   INTRAVASCULAR ULTRASOUND/IVUS N/A 03/11/2020   Procedure: Intravascular Ultrasound/IVUS;  Surgeon: Jettie Booze, MD;  Location: [place] [facility];  Service: Cardiovascular;  Laterality: N/A;   KNEE ARTHROSCOPY     right   LEFT HEART CATH AND CORONARY ANGIOGRAPHY N/A 03/11/2020   Procedure: LEFT HEART CATH AND CORONARY ANGIOGRAPHY;  Surgeon: Jettie Booze, MD;  Location: [place] [facility] [facility];  Service: Cardiovascular;  Laterality: N/A;  LOOP RECORDER INSERTION N/A 10/19/2016   Procedure: LOOP RECORDER INSERTION;  Surgeon: Thompson Grayer, MD;  Location: [facility];  Service: Cardiovascular;  Laterality: N/A;   ORIF ANKLE FRACTURE Right 03/24/2012   Procedure: OPEN REDUCTION INTERNAL FIXATION (ORIF) ANKLE FRACTURE;  Surgeon: Newt Minion, MD;  Location: [place];  Service: Orthopedics;  Laterality: Right;  Open Reduction Internal Fixation Right Bimalleolar ankle fracture   POLYPECTOMY     TEE WITHOUT CARDIOVERSION N/A  10/18/2016   Procedure: TRANSESOPHAGEAL ECHOCARDIOGRAM (TEE);  Surgeon: Fay Records, MD;  [facility];  Service: Cardiovascular;  Laterality: N/A;   TONSILLECTOMY      Her Family History Is Significant For: Family History  Problem Relation Age of Onset   Diabetes Mother    Hypertension Mother    Stroke Father    Stroke Maternal Uncle    Stroke Paternal Uncle    Colon cancer Neg Hx    Esophageal cancer Neg Hx    Rectal cancer Neg Hx    Stomach cancer Neg Hx    Inflammatory bowel disease Neg Hx    Liver disease Neg Hx    Pancreatic cancer Neg Hx     Her Social History Is Significant For: Social History   Socioeconomic History   Marital status: Single    Spouse name: Not on file   Number of children: Not on file   Years of education: Not on file   Highest education level: Not on file  Occupational History   Not on file  Tobacco Use   Smoking status: Former    Types: Cigarettes    Quit date: 10/15/2016    Years since quitting: 5.4   Smokeless tobacco: Never   Tobacco comments:    smokes occ.   Vaping Use   Vaping Use: Never used  Substance and Sexual Activity   Alcohol use: Yes    Alcohol/week: 0.0 standard drinks of alcohol    Comment: occ   Drug use: No   Sexual activity: Not on file  Other Topics Concern   Not on file  Social History Narrative   Caffiene coffee 1 cup in am.   Retired from [employer].     Social Determinants of Health   Financial Resource Strain: Low Risk  (04/29/2021)   Overall Financial Resource Strain (CARDIA)    Difficulty of Paying Living Expenses: Not hard at all  Food Insecurity: No Food Insecurity (03/26/2022)   Hunger Vital Sign    Worried About Running Out of Food in the Last Year: Never true    Ran Out of Food in the Last Year: Never true  Transportation Needs: No Transportation Needs (03/25/2022)   PRAPARE - Hydrologist (Medical): No    Lack of Transportation (Non-Medical): No   Recent Concern: Transportation Needs - Unmet Transportation Needs (03/08/2022)   PRAPARE - Transportation    Lack of Transportation (Medical): Yes    Lack of Transportation (Non-Medical): Yes  Physical Activity: Insufficiently Active (04/29/2021)   Exercise Vital Sign    Days of Exercise per Week: 1 day    Minutes of Exercise per Session: 30 min  Stress: No Stress Concern Present (04/29/2021)   [place]    Feeling of Stress : Not at all  Social Connections: Socially Isolated (04/29/2021)   Social Connection and Isolation Panel [NHANES]    Frequency of Communication with Friends and Family: More than three times  a week    Frequency of Social Gatherings with Friends and Family: More than three times a week    Attends Religious Services: Never    Marine scientist or Organizations: No    Attends Archivist Meetings: Never    Marital Status: Never married    Her Allergies Are:  No Known Allergies:   Her Current Medications Are:  Outpatient Encounter Medications as of 04/05/2022  Medication Sig   amLODipine (NORVASC) 5 MG tablet Take 1 tablet (5 mg total) by mouth daily.   atorvastatin (LIPITOR) 40 MG tablet Take 2 tablets (80 mg total) by mouth daily.   cholecalciferol (VITAMIN D3) 25 MCG (1000 UNIT) tablet Take 1,000 Units by mouth daily.   clopidogrel (PLAVIX) 75 MG tablet Take 1 tablet (75 mg total) by mouth daily.   Continuous Blood Gluc Sensor (DEXCOM G6 SENSOR) MISC Change sensors every 10 days   Continuous Blood Gluc Transmit (DEXCOM G6 TRANSMITTER) MISC Change every 90 days   ezetimibe (ZETIA) 10 MG tablet Take 1 tablet (10 mg total) by mouth daily.   FARXIGA 10 MG TABS tablet Take 1 tablet (10 mg total) by mouth daily.   Finerenone (KERENDIA) 10 MG TABS Take 10 mg by mouth daily.   furosemide (LASIX) 20 MG tablet Take 1 tablet by mouth daily (only as needed)   hydrALAZINE (APRESOLINE) 10 MG  tablet Take 2 tablets (20 mg total) by mouth 3 (three) times daily.   insulin glargine, 1 Unit Dial, (TOUJEO SOLOSTAR) 300 UNIT/ML Solostar Pen Inject 16 Units into the skin daily in the afternoon.   Insulin Pen Needle 32G X 4 MM MISC 1 Device by Does not apply route in the morning, at noon, in the evening, and at bedtime.   latanoprost (XALATAN) 0.005 % ophthalmic solution Place 1 drop into both eyes at bedtime.   liraglutide (VICTOZA) 18 MG/3ML SOPN Inject 1.2 mg into the skin daily.   losartan (COZAAR) 50 MG tablet TAKE 1 TABLET(50 MG) BY MOUTH DAILY   nitroGLYCERIN (NITROSTAT) 0.4 MG SL tablet Place 1 tablet (0.4 mg total) under the tongue every 5 (five) minutes as needed for chest pain. one tab every 5 minutes up to 3 tablets total over 15 minutes.   ondansetron (ZOFRAN) 4 MG tablet Take 1 tablet (4 mg total) by mouth 2 (two) times daily as needed for nausea or vomiting.   sertraline (ZOLOFT) 50 MG tablet TAKE 1 TABLET(50 MG) BY MOUTH DAILY   topiramate (TOPAMAX) 25 MG tablet Take 1 tablet (25 mg total) by mouth at bedtime.   No facility-administered encounter medications on file as of 04/05/2022.  :   Review of Systems:  Out of a complete 14 point review of systems, all are reviewed and negative with the exception of these symptoms as listed below:  Review of Systems  Neurological:        Evaluate for possible underlying sleep apnea in setting of recurrent strokes, excessive daytime fatigue, headaches and nocturia.  ESS 14. FSS 39.  Snoring.      Objective:  Neurological Exam  Physical Exam Physical Examination:   Vitals:   04/05/22 1106  BP: (!) 119/51  Pulse: 84    General Examination: The patient is a very pleasant 77 y.o. female in no acute distress. She appears well-developed and well groomed.   HEENT: Normocephalic, atraumatic, pupils are equal, round and reactive to light, extraocular tracking is good without limitation to gaze excursion or nystagmus noted. Hearing is  grossly intact. Face is symmetric with normal facial animation. Speech is clear with no dysarthria noted. There is no hypophonia. There is no lip, neck/head, jaw or voice tremor. Neck is supple with full range of passive and active motion. There are no carotid bruits on auscultation. Oropharynx exam reveals: moderate mouth dryness, marginal dental hygiene and moderate airway crowding, due to smaller airway entry, wider tongue noted, tonsils absent, Mallampati class II, neck circumference 13 1/4 inches.  She has a minimal overbite, needs dental work done.    Chest: Clear to auscultation without wheezing, rhonchi or crackles noted.  Heart: S1+S2+0, regular and normal without murmurs, rubs or gallops noted.   Abdomen: Soft, non-tender and non-distended.  Extremities: There is no pitting edema in the distal lower extremities bilaterally.   Skin: Warm and dry without trophic changes noted.   Musculoskeletal: exam reveals no obvious joint deformities.   Neurologically:  Mental status: The patient is awake, alert and oriented in all 4 spheres. Her immediate and remote memory, attention, language skills and fund of knowledge are appropriate. There is no evidence of aphasia, agnosia, apraxia or anomia. Speech is clear with normal prosody and enunciation. Thought process is linear. Mood is normal and affect is normal.  Cranial nerves II - XII are as described above under HEENT exam.  Motor exam: Thin bulk, global strength of about 4 out of 5, right side a little bit weaker, no obvious action or resting tremor.  Fine motor skills and coordination: grossly intact.  Cerebellar testing: No dysmetria or intention tremor. There is no truncal or gait ataxia.  Sensory exam: intact to light touch in the upper and lower extremities.  Gait, station and balance: She stands without difficulty but stands up slowly, she requires no assistance, she walks with a cane.  She walks slowly and cautiously.    Assessment and  Plan:  In summary, LALITA TOH is a very pleasant 77 y.o.-year old female with an underlying medical history of multiple strokes, including stroke of the left cerebellum and left parietal lobe in October 2023, and prior right MCA territory and right cerebellar infarcts, type 2 diabetes, hyperlipidemia, osteoporosis, hypertension, glaucoma, carpal tunnel syndrome, history of pneumonia, and colonic polyps, whose history and physical exam are concerning for sleep disordered breathing, particularly obstructive sleep apnea (OSA). A laboratory attended sleep study is typically considered "gold standard" for evaluation of sleep disordered breathing.   I had a long chat with the patient about my findings and the diagnosis of sleep apnea, particularly OSA, its prognosis and treatment options. We talked about medical/conservative treatments, surgical interventions and non-pharmacological approaches for symptom control. I explained, in particular, the risks and ramifications of untreated moderate to severe OSA, especially with respect to developing cardiovascular disease down the road, including congestive heart failure (CHF), difficult to treat hypertension, cardiac arrhythmias (particularly A-fib), neurovascular complications including TIA, stroke and dementia. Even type 2 diabetes has, in part, been linked to untreated OSA. Symptoms of untreated OSA may include (but may not be limited to) daytime sleepiness, nocturia (i.e. frequent nighttime urination), memory problems, mood irritability and suboptimally controlled or worsening mood disorder such as depression and/or anxiety, lack of energy, lack of motivation, physical discomfort, as well as recurrent headaches, especially morning or nocturnal headaches. We talked about the importance of maintaining a healthy lifestyle and striving for healthy weight.  The importance ongoing smoking cessation was also addressed.  In addition, we talked about the importance of  striving for and maintaining good  sleep hygiene. I recommended a sleep study at this time. I outlined the differences between a laboratory attended sleep study which is considered more comprehensive and accurate over the option of a home sleep test (HST); the latter may lead to underestimation of sleep disordered breathing in some instances and does not help with diagnosing upper airway resistance syndrome and is not accurate enough to diagnose primary central sleep apnea typically. I outlined possible surgical and non-surgical treatment options of OSA, including the use of a positive airway pressure (PAP) device (i.e. CPAP, AutoPAP/APAP or BiPAP in certain circumstances), a custom-made dental device (aka oral appliance, which would require a referral to a specialist dentist or orthodontist typically, and is generally speaking not considered for patients with full dentures or edentulous state), upper airway surgical options, such as traditional UPPP (which is not considered a first-line treatment) or the Inspire device (hypoglossal nerve stimulator, which would involve a referral for consultation with an ENT surgeon, after careful selection, following inclusion criteria - also not first-line treatment). I explained the PAP treatment option to the patient in detail, as this is generally considered first-line treatment.  The patient indicated that she would be willing to try PAP therapy, if the need arises. I explained the importance of being compliant with PAP treatment, not only for insurance purposes but primarily to improve patient's symptoms symptoms, and for the patient's long term health benefit, including to reduce Her cardiovascular risks longer-term.    We will pick up our discussion about the next steps and treatment options after testing.  We will keep her posted as to the test results by phone call and/or MyChart messaging where possible.  We will plan to follow-up in sleep clinic accordingly as well.   I answered all her questions today and the patient was in agreement.   I encouraged her to call with any interim questions, concerns, problems or updates or email [place] through [place].  Generally speaking, sleep test authorizations may take up to 2 weeks, sometimes less, sometimes longer, the patient is encouraged to get in touch with [place] if they do not hear back from the sleep lab staff directly within the next 2 weeks.  Thank you very much for allowing me to participate in the care of this nice patient. If I can be of any further assistance to you please do not hesitate to talk to me.  Sincerely,   Star Age, MD, PhD

## 2022-04-11 ENCOUNTER — Other Ambulatory Visit: Payer: Self-pay | Admitting: Family Medicine

## 2022-04-11 DIAGNOSIS — E782 Mixed hyperlipidemia: Secondary | ICD-10-CM

## 2022-04-11 MED ORDER — ROSUVASTATIN CALCIUM 20 MG PO TABS: 20.0000 mg | ORAL_TABLET | Freq: Every day | ORAL | 3 refills | Status: DC

## 2022-04-14 ENCOUNTER — Telehealth: Payer: Self-pay | Admitting: Family Medicine

## 2022-04-14 NOTE — Telephone Encounter (Signed)
Yes, rosuvastatin is another cholesterol medication, tablet is smaller than atorvastatin. Stop Atorvastatin and start Rosuvastatin. Thanks, BJ

## 2022-04-14 NOTE — Telephone Encounter (Signed)
Wants to know if the rosuvastatin (CRESTOR) 20 MG tablet is a replacement atorvastatin

## 2022-04-15 NOTE — Telephone Encounter (Signed)
Patient informed of the message below.

## 2022-04-28 NOTE — Progress Notes (Signed)
Cardiology Office Note:    Date:  04/30/2022   ID:  Sarah [name], DOB 06-03-1945, MRN [id number]  PCP:  [place], [name], MD  [facility] Cardiologist:  Riley Lam MD [facility] Electrophysiologist:  None   CC: Follow up stroke  History of Present Illness:    Sarah [name] is a 77 y.o. female with a hx of DM with HTN, Prior Stroke in 2018, Aortic atherosclerosis who presents for evaluation 2/22. 2022: had echocardiogram that was relatively benign and a positive CCTA for RCA disease, had LHC and PCI.  patient had normal vascular duplex. 2023: I saw her in the hospital at the time of her stroke; had Type II NSTEMI for permissive HTN. 2024: Getting sleep study  Patient notes that she is doing ok.   Since last visit notes that she feels about the same . She notes that she hasn't got her last COVID-19 booster.  No chest pain or pressure.  No SOB/DOE and no PND/Orthopnea.  No weight gain or leg swelling.  No palpitations or syncope . Feels depressed about having another stroke.  She had a prior ILR and felt bumps in her skin; an MRI from her last stroke may have suggested a cardi-embolic etiology.  No AF was picked up on her last ILR.    Past Medical History:  Diagnosis Date   Bilateral carpal tunnel syndrome 03/27/2019   Boils    Glaucoma    Heart murmur    HTN (hypertension)    Hx of adenomatous colonic polyps    Hyperlipidemia    Osteoporosis    Pneumonia    Stroke    Type II or unspecified type diabetes mellitus without mention of complication, uncontrolled     Past Surgical History:  Procedure Laterality Date   ABDOMINAL HYSTERECTOMY     CARPAL TUNNEL RELEASE     right   COLONOSCOPY  12-10-10   per Dr. Arlyce Dice, clear, repeat in 7 yrs    CORONARY STENT INTERVENTION N/A 03/11/2020   Procedure: CORONARY STENT INTERVENTION;  Surgeon: Corky Crafts, MD;  Location: [facility] INVASIVE CV LAB;  Service: Cardiovascular;  Laterality: N/A;   CORONARY  ULTRASOUND/IVUS N/A 03/11/2020   Procedure: Intravascular Ultrasound/IVUS;  Surgeon: Corky Crafts, MD;  Location: [facility] INVASIVE CV LAB;  Service: Cardiovascular;  Laterality: N/A;   INCISION AND DRAINAGE PERIRECTAL ABSCESS N/A 09/26/2015   Procedure: IRRIGATION AND DEBRIDEMENT PERIRECTAL ABSCESS;  Surgeon: Rodman Pickle, MD;  Location: [facility] OR;  Service: General;  Laterality: N/A;   KNEE ARTHROSCOPY     right   LEFT HEART CATH AND CORONARY ANGIOGRAPHY N/A 03/11/2020   Procedure: LEFT HEART CATH AND CORONARY ANGIOGRAPHY;  Surgeon: Corky Crafts, MD;  Location: [facility] INVASIVE CV LAB;  Service: Cardiovascular;  Laterality: N/A;   LOOP RECORDER INSERTION N/A 10/19/2016   Procedure: LOOP RECORDER INSERTION;  Surgeon: Hillis Range, MD;  Location: [facility] INVASIVE CV LAB;  Service: Cardiovascular;  Laterality: N/A;   ORIF ANKLE FRACTURE Right 03/24/2012   Procedure: OPEN REDUCTION INTERNAL FIXATION (ORIF) ANKLE FRACTURE;  Surgeon: Nadara Mustard, MD;  Location: [facility] OR;  Service: Orthopedics;  Laterality: Right;  Open Reduction Internal Fixation Right Bimalleolar ankle fracture   POLYPECTOMY     TEE WITHOUT CARDIOVERSION N/A 10/18/2016   Procedure: TRANSESOPHAGEAL ECHOCARDIOGRAM (TEE);  Surgeon: Pricilla Riffle, MD;  Location: [facility] ENDOSCOPY;  Service: Cardiovascular;  Laterality: N/A;   TONSILLECTOMY      Current Medications: Current Meds  Medication Sig  amLODipine (NORVASC) 5 MG tablet Take 1 tablet (5 mg total) by mouth daily.   cholecalciferol (VITAMIN D3) 25 MCG (1000 UNIT) tablet Take 1,000 Units by mouth daily.   clopidogrel (PLAVIX) 75 MG tablet Take 1 tablet (75 mg total) by mouth daily.   Continuous Blood Gluc Sensor (DEXCOM G6 SENSOR) MISC Change sensors every 10 days   Continuous Blood Gluc Transmit (DEXCOM G6 TRANSMITTER) MISC Change every 90 days   ezetimibe (ZETIA) 10 MG tablet Take 1 tablet (10 mg total) by mouth daily.   FARXIGA 10 MG TABS tablet Take 1 tablet (10 mg total) by  mouth daily.   Finerenone (KERENDIA) 10 MG TABS Take 10 mg by mouth daily.   furosemide (LASIX) 20 MG tablet Take 1 tablet by mouth daily (only as needed)   hydrALAZINE (APRESOLINE) 10 MG tablet Take 2 tablets (20 mg total) by mouth 3 (three) times daily.   insulin glargine, 1 Unit Dial, (TOUJEO SOLOSTAR) 300 UNIT/ML Solostar Pen Inject 16 Units into the skin daily in the afternoon.   Insulin Pen Needle 32G X 4 MM MISC 1 Device by Does not apply route in the morning, at noon, in the evening, and at bedtime.   latanoprost (XALATAN) 0.005 % ophthalmic solution Place 1 drop into both eyes at bedtime.   liraglutide (VICTOZA) 18 MG/3ML SOPN Inject 1.2 mg into the skin daily.   losartan (COZAAR) 50 MG tablet TAKE 1 TABLET(50 MG) BY MOUTH DAILY   nitroGLYCERIN (NITROSTAT) 0.4 MG SL tablet Place 1 tablet (0.4 mg total) under the tongue every 5 (five) minutes as needed for chest pain. one tab every 5 minutes up to 3 tablets total over 15 minutes.   ondansetron (ZOFRAN) 4 MG tablet Take 1 tablet (4 mg total) by mouth 2 (two) times daily as needed for nausea or vomiting.   rosuvastatin (CRESTOR) 20 MG tablet Take 1 tablet (20 mg total) by mouth daily.   sertraline (ZOLOFT) 50 MG tablet TAKE 1 TABLET(50 MG) BY MOUTH DAILY   topiramate (TOPAMAX) 25 MG tablet Take 1 tablet (25 mg total) by mouth at bedtime.     Allergies:   Patient has no known allergies.   Social History   Socioeconomic History   Marital status: Single    Spouse name: Not on file   Number of children: Not on file   Years of education: Not on file   Highest education level: Not on file  Occupational History   Not on file  Tobacco Use   Smoking status: Former    Types: Cigarettes    Quit date: 10/15/2016    Years since quitting: 5.5   Smokeless tobacco: Never   Tobacco comments:    smokes occ.   Vaping Use   Vaping Use: Never used  Substance and Sexual Activity   Alcohol use: Yes    Alcohol/week: 0.0 standard drinks of  alcohol    Comment: occ   Drug use: No   Sexual activity: Not on file  Other Topics Concern   Not on file  Social History Narrative   Caffiene coffee 1 cup in am.   Retired from [employer].     Social Determinants of Health   Financial Resource Strain: Low Risk  (04/29/2021)   Overall Financial Resource Strain (CARDIA)    Difficulty of Paying Living Expenses: Not hard at all  Food Insecurity: No Food Insecurity (03/26/2022)   Hunger Vital Sign    Worried About Running Out of Food in the  Last Year: Never true    Ran Out of Food in the Last Year: Never true  Transportation Needs: No Transportation Needs (03/25/2022)   PRAPARE - Administrator, Civil Service (Medical): No    Lack of Transportation (Non-Medical): No  Recent Concern: Transportation Needs - Unmet Transportation Needs (03/08/2022)   PRAPARE - Transportation    Lack of Transportation (Medical): Yes    Lack of Transportation (Non-Medical): Yes  Physical Activity: Insufficiently Active (04/29/2021)   Exercise Vital Sign    Days of Exercise per Week: 1 day    Minutes of Exercise per Session: 30 min  Stress: No Stress Concern Present (04/29/2021)   [employer] of Occupational Health - Occupational Stress Questionnaire    Feeling of Stress : Not at all  Social Connections: Socially Isolated (04/29/2021)   Social Connection and Isolation Panel [NHANES]    Frequency of Communication with Friends and Family: More than three times a week    Frequency of Social Gatherings with Friends and Family: More than three times a week    Attends Religious Services: Never    Database administrator or Organizations: No    Attends Engineer, structural: Never    Marital Status: Never married    Family History: The patient's family history includes Diabetes in her mother; Hypertension in her mother; Stroke in her father, maternal uncle, and paternal uncle. There is no history of Colon cancer, Esophageal cancer,  Rectal cancer, Stomach cancer, Inflammatory bowel disease, Liver disease, or Pancreatic cancer. History of coronary artery disease notable for no members. History of heart failure notable for father. History of arrhythmia notable for no members.  ROS:   Please see the history of present illness.     All other systems reviewed and are negative.  EKGs/Labs/Other Studies Reviewed:    The following studies were reviewed today:  EKG:   04/08/21  SR LVH  03/04/20: SR rate 78 nonspecific TW flattening 02/12/2020: SR rate 81 WNL  Cardiac Studies & Procedures   CARDIAC CATHETERIZATION  CARDIAC CATHETERIZATION 03/11/2020  Narrative  Prox RCA lesion is 50% stenosed.  Mid RCA lesion is 90% stenosed.  A drug-eluting stent was successfully placed using a STENT RESOLUTE ONYX 3.0X30, post dilated to 3.75 mm, and optimized with IVUS.  Post intervention, there is a 0% residual stenosis.  Prox Cx lesion is 50% stenosed. This looks more significant in the caudal views. CT FFR of the circumflex was negative.  Mid Cx lesion is 25% stenosed.  Prox LAD lesion is 25% stenosed.  The left ventricular systolic function is normal.  LV end diastolic pressure is normal. LVEDP 14 mm Hg.  The left ventricular ejection fraction is 55-65% by visual estimate.  There is no aortic valve stenosis.  Successful PCI of the RCA.  Moderate disease in the proximal circumflex, if she had persistent symptoms, could reevaluate this vessel.  Severe bend in the LAD which may have affected CT FFR calculation.  I discussed the findings with the patient's sister.  Continue dual antiplatelet therapy along with aggressive secondary prevention.  Plan for same-day discharge.  Findings Coronary Findings Diagnostic  Dominance: Right  Left Anterior Descending Prox LAD lesion is 25% stenosed.  Left Circumflex Prox Cx lesion is 50% stenosed. The lesion is eccentric. Mid Cx lesion is 25% stenosed.  Right Coronary  Artery Prox RCA lesion is 50% stenosed. Mid RCA lesion is 90% stenosed. The lesion is eccentric, irregular and ulcerative.  Intervention  Mid  RCA lesion Stent CATH VISTA GUIDE 6FR XBRCA guide catheter was inserted. Lesion crossed with guidewire using a WIRE ASAHI PROWATER 180CM. Pre-stent angioplasty was performed using a BALLOON SAPPHIRE 2.5X20. A drug-eluting stent was successfully placed using a STENT RESOLUTE ONYX 3.0X30. Stent strut is well apposed. Post-stent angioplasty was performed using a BALLOON SAPPHIRE Braman [id number]. IVUS revealed that proximally, the vessel appeared to be 4.0 mm and more distally, it appeared to be about a 3.5 mm vessel. Post-Intervention Lesion Assessment The intervention was successful. Pre-interventional TIMI flow is 3. Post-intervention TIMI flow is 3. Ultrasound (IVUS) was performed on the lesion post PCI. Stent well apposed. There is a 0% residual stenosis post intervention.     ECHOCARDIOGRAM  ECHOCARDIOGRAM COMPLETE 10/24/2021  Narrative ECHOCARDIOGRAM REPORT    Patient Name:   KORAL THADEN Date of Exam: 10/24/2021 Medical Rec #:  [id number]            Height:       60.0 in Accession #:    [id number]           Weight:       121.0 lb Date of Birth:  [birth year]            BSA:          1.508 m Patient Age:    76 years             BP:           177/77 mmHg Patient Gender: F                    HR:           74 bpm. Exam Location:  Inpatient  Procedure: 2D Echo, Cardiac Doppler and Color Doppler  Indications:    Stroke  History:        Patient has prior history of Echocardiogram examinations, most recent 04/27/2021. Pacemaker; Risk Factors:Diabetes, Hypertension, Dyslipidemia and Former Smoker. CKD. GERD.  Sonographer:    Ross Ludwig RDCS (AE) Referring Phys: Terrilee Files [facility]  IMPRESSIONS   1. Left ventricular ejection fraction, by estimation, is 60 to 65%. The left ventricle has normal function. The left ventricle has no regional wall  motion abnormalities. There is moderate left ventricular hypertrophy. Left ventricular diastolic parameters are consistent with Grade I diastolic dysfunction (impaired relaxation). 2. Right ventricular systolic function is normal. The right ventricular size is normal. Tricuspid regurgitation signal is inadequate for assessing PA pressure. 3. The mitral valve is grossly normal. Mild mitral valve regurgitation. No evidence of mitral stenosis. 4. The aortic valve is tricuspid. There is mild calcification of the aortic valve. There is mild thickening of the aortic valve. Aortic valve regurgitation is trivial. Aortic valve sclerosis/calcification is present, without any evidence of aortic stenosis. 5. The inferior vena cava is normal in size with greater than 50% respiratory variability, suggesting right atrial pressure of 3 mmHg.  Conclusion(s)/Recommendation(s): No intracardiac source of embolism detected on this transthoracic study. Consider a transesophageal echocardiogram to exclude cardiac source of embolism if clinically indicated. Future echocardiograms should include global longitudinal strain assessment in setting of moderate LVH.  FINDINGS Left Ventricle: Left ventricular ejection fraction, by estimation, is 60 to 65%. The left ventricle has normal function. The left ventricle has no regional wall motion abnormalities. The left ventricular internal cavity size was normal in size. There is moderate left ventricular hypertrophy. Left ventricular diastolic parameters are consistent with Grade I diastolic dysfunction (impaired relaxation).  Right Ventricle: The right  ventricular size is normal. No increase in right ventricular wall thickness. Right ventricular systolic function is normal. Tricuspid regurgitation signal is inadequate for assessing PA pressure.  Left Atrium: Left atrial size was normal in size.  Right Atrium: Right atrial size was normal in size.  Pericardium: Trivial  pericardial effusion is present.  Mitral Valve: Chordal SAM. The mitral valve is grossly normal. Mild mitral valve regurgitation. No evidence of mitral valve stenosis.  Tricuspid Valve: The tricuspid valve is normal in structure. Tricuspid valve regurgitation is trivial. No evidence of tricuspid stenosis.  Aortic Valve: The aortic valve is tricuspid. There is mild calcification of the aortic valve. There is mild thickening of the aortic valve. Aortic valve regurgitation is trivial. Aortic valve sclerosis/calcification is present, without any evidence of aortic stenosis. Aortic valve mean gradient measures 3.0 mmHg. Aortic valve peak gradient measures 5.4 mmHg. Aortic valve area, by VTI measures 2.58 cm.  Pulmonic Valve: The pulmonic valve was normal in structure. Pulmonic valve regurgitation is trivial. No evidence of pulmonic stenosis.  Aorta: The aortic root is normal in size and structure.  Venous: The inferior vena cava is normal in size with greater than 50% respiratory variability, suggesting right atrial pressure of 3 mmHg.  IAS/Shunts: No atrial level shunt detected by color flow Doppler.   LEFT VENTRICLE PLAX 2D LVIDd:         3.80 cm   Diastology LVIDs:         2.20 cm   LV e' medial:    4.35 cm/s LV PW:         1.10 cm   LV E/e' medial:  17.2 LV IVS:        1.40 cm   LV e' lateral:   6.20 cm/s LVOT diam:     1.70 cm   LV E/e' lateral: 12.1 LV SV:         59 LV SV Index:   39 LVOT Area:     2.27 cm   RIGHT VENTRICLE             IVC RV Basal diam:  1.90 cm     IVC diam: 1.60 cm RV S prime:     14.50 cm/s TAPSE (M-mode): 1.9 cm  LEFT ATRIUM             Index        RIGHT ATRIUM          Index LA diam:        3.40 cm 2.25 cm/m   RA Area:     8.57 cm LA Vol (A2C):   46.3 ml 30.71 ml/m  RA Volume:   14.60 ml 9.68 ml/m LA Vol (A4C):   36.3 ml 24.07 ml/m LA Biplane Vol: 41.7 ml 27.66 ml/m AORTIC VALVE AV Area (Vmax):    2.52 cm AV Area (Vmean):   2.54 cm AV  Area (VTI):     2.58 cm AV Vmax:           116.00 cm/s AV Vmean:          77.700 cm/s AV VTI:            0.228 m AV Peak Grad:      5.4 mmHg AV Mean Grad:      3.0 mmHg LVOT Vmax:         129.00 cm/s LVOT Vmean:        87.000 cm/s LVOT VTI:  0.259 m LVOT/AV VTI ratio: 1.14  AORTA Ao Root diam: 3.10 cm Ao Asc diam:  3.00 cm  MITRAL VALVE MV Area (PHT): 2.93 cm     SHUNTS MV Decel Time: 259 msec     Systemic VTI:  0.26 m MV E velocity: 74.90 cm/s   Systemic Diam: 1.70 cm MV A velocity: 103.00 cm/s MV E/A ratio:  0.73  Weston Brass MD Electronically signed by Weston Brass MD Signature Date/Time: 10/24/2021/11:29:16 AM    Final    MONITORS  CARDIAC EVENT MONITOR 11/26/2021   CT SCANS  CT CORONARY MORPH W/CTA COR W/SCORE 02/28/2020  Addendum 02/28/2020  5:09 PM ADDENDUM REPORT: 02/28/2020 17:06  CLINICAL DATA:  77 year old female with chest pain.  EXAM: Cardiac/Coronary  CTA  TECHNIQUE: The patient was scanned on a [employer].  FINDINGS: A 120 kV prospective scan was triggered in the descending thoracic aorta at 111 HU's. Axial non-contrast 3 mm slices were carried out through the heart. The data set was analyzed on a dedicated work station and scored using the Agatson method. Gantry rotation speed was 250 msecs and collimation was .6 mm. Beta blockade and 0.8 mg of sl NTG was given. The 3D data set was reconstructed in 5% intervals of the 67-82 % of the R-R cycle. Diastolic phases were analyzed on a dedicated work station using MPR, MIP and VRT modes. The patient received 80 cc of contrast.  Aorta: Normal size. Descending aortic atherosclerosis. No dissection.  Aortic Valve:  Trileaflet.  Mild focal calcification.  Coronary Arteries:  Normal coronary origin.  Right dominance.  RCA is a large dominant artery that gives rise to PDA and PLA. There is mid vessel sequential soft plaque, 75-99% stenosis.  Left main is a large  artery that gives rise to LAD and LCX arteries.  LAD is a large vessel that has spotty calcification in the proximal segment 0-24%, calcified plaque.  LCX is a non-dominant artery that gives rise to one large OM1 branch. There is proximal soft plaque 50% with a secondary mid lesion 50-69%.  Other findings:  Normal pulmonary vein drainage into the left atrium.  Normal left atrial appendage without a thrombus.  Normal size of the pulmonary artery.  Please see radiology report for non cardiac findings.  IMPRESSION: 1. Coronary calcium score of 25. This was 74 percentile for age and sex matched control.  2. Normal coronary origin with right dominance.  3. Multivessel CAD - mild LAD disease, moderate to severe stenosis of Circumflex and RCA. Sending for FFR analysis.  4.  Aortic atherosclerosis descending.  Donato Schultz, MD [facility]   Electronically Signed By: Donato Schultz MD On: 02/28/2020 17:06  Narrative EXAM: OVER-READ INTERPRETATION  CT CHEST  The following report is an over-read performed by radiologist Dr. Charlett Nose of [facility] on 02/28/2020. This over-read does not include interpretation of cardiac or coronary anatomy or pathology. The coronary CTA interpretation by the cardiologist is attached.  COMPARISON:  09/26/2011  FINDINGS: Vascular: Scattered aortic calcifications. No evidence of aortic aneurysm. Heart is normal size.  Mediastinum/Nodes: No adenopathy  Lungs/Pleura: No confluent opacities or effusions.  Upper Abdomen: Imaging into the upper abdomen demonstrates no acute findings.  Musculoskeletal: Chest wall soft tissues are unremarkable. No acute bony abnormality.  IMPRESSION: No acute extra cardiac abnormality.  Aortic atherosclerosis.  Electronically Signed: By: Charlett Nose M.D. On: 02/28/2020 15:58            Recent Labs: 10/24/2021:  TSH 1.591 10/26/2021: Magnesium 2.0 11/19/2021: Hemoglobin 11.7; Platelets  376 12/08/2021: BUN 15; Creatinine, Ser 0.85; Potassium 4.1; Sodium 144  Recent Lipid Panel    Component Value Date/Time   CHOL 166 03/09/2022 1445   TRIG 70 03/09/2022 1445   HDL 90 03/09/2022 1445   CHOLHDL 1.8 03/09/2022 1445   CHOLHDL 3.9 10/24/2021 0512   VLDL 42 (H) 10/24/2021 0512   LDLCALC 63 03/09/2022 1445   Physical Exam:    VS:  BP 118/64 (BP Location: Left Arm, Patient Position: Sitting, Cuff Size: Normal)   Pulse 77   Ht 5' (1.524 m)   Wt 111 lb 6.4 oz (50.5 kg)   SpO2 96%   BMI 21.76 kg/m     Wt Readings from Last 3 Encounters:  04/30/22 111 lb 6.4 oz (50.5 kg)  04/05/22 113 lb 6.4 oz (51.4 kg)  03/09/22 114 lb (51.7 kg)   Gen: no distress,  Neck: No JVD Cardiac: No Rubs or Gallops, Systolic Murmur, RRR +2 radial pulses Respiratory: Clear to auscultation bilaterally, normal effort, normal  respiratory rate GI: Soft, nontender, non-distended  MS: no edema;  moves all extremities Integument: Skin feels warm Neuro:  At time of evaluation, alert and oriented to person/place/time/situation  Psych: Depressed mood and affect   ASSESSMENT:    1. Cerebrovascular accident (CVA) due to embolism of cerebral artery   2. Coronary artery disease of native artery of native heart with stable angina pectoris   3. Aortic atherosclerosis   4. Primary hypertension   5. Type II diabetes mellitus with nephropathy     PLAN:    Hx of of Strokes - mild CAS bilateral in as of 2023 - given the concern for cardio-embolic disease on MRI; we have discussed repeat ILR, she had bumps in her skin and didn't want to pursue it at this time, we gave her some patient information and she will let us know - discussed her depression related to strokes and offered support - she is going to get dental work; I have no cardiac indications to keep her from this and her neurologist (who de-escalated her DAPT to plavix) listed no concerns   Coronary Artery Disease; Obstructive Hyperlipidemia  (mixed) Aortic Atherosclerosis - asymptomatic  - anatomy: mRCA s/p PCI  - no ASA due to plavix - - continue metoprolol 25 mg PO BID - continue cozaar 50 mg PO daily - continue PRN nitro (non needed) -LDL goal less than 70   Diabetes with Hypertension - controlled on current therapy  Will see in one year unless changes her mind about ILR (EP visit)     Medication Adjustments/Labs and Tests Ordered: Current medicines are reviewed at length with the patient today.  Concerns regarding medicines are outlined above.  No orders of the defined types were placed in this encounter.   No orders of the defined types were placed in this encounter.    Patient Instructions  Medication Instructions:  Your physician recommends that you continue on your current medications as directed. Please refer to the Current Medication list given to you today.  *If you need a refill on your cardiac medications before your next appointment, please call your pharmacy*   Lab Work: NONE If you have labs (blood work) drawn today and your tests are completely normal, you will receive your results only by: MyChart Message (if you have MyChart) OR A paper copy in the mail If you have any lab test that is abnormal or we need to  change your treatment, we will call you to review the results.   Testing/Procedures: NONE   Follow-Up: At [facility], you and your health needs are our priority.  As part of our continuing mission to provide you with exceptional heart care, we have created designated Provider Care Teams.  These Care Teams include your primary Cardiologist (physician) and Advanced Practice Providers (APPs -  Physician Assistants and Nurse Practitioners) who all work together to provide you with the care you need, when you need it.   Your next appointment:   1 year(s)  Provider:   Riley Lam, MD       Signed, Christell Constant, MD  04/30/2022 8:55 AM    [facility] \

## 2022-04-29 ENCOUNTER — Ambulatory Visit: Payer: Medicare Other | Admitting: Internal Medicine

## 2022-04-30 ENCOUNTER — Ambulatory Visit: Payer: Medicare Other | Attending: Internal Medicine | Admitting: Internal Medicine

## 2022-04-30 ENCOUNTER — Encounter: Payer: Self-pay | Admitting: Internal Medicine

## 2022-04-30 VITALS — BP 118/64 | HR 77 | Ht 60.0 in | Wt 111.4 lb

## 2022-04-30 DIAGNOSIS — I25118 Atherosclerotic heart disease of native coronary artery with other forms of angina pectoris: Secondary | ICD-10-CM

## 2022-04-30 DIAGNOSIS — I700 Atherosclerosis of aorta: Secondary | ICD-10-CM

## 2022-04-30 DIAGNOSIS — I10 Essential (primary) hypertension: Secondary | ICD-10-CM

## 2022-04-30 DIAGNOSIS — I6340 Cerebral infarction due to embolism of unspecified cerebral artery: Secondary | ICD-10-CM | POA: Diagnosis not present

## 2022-04-30 DIAGNOSIS — E1121 Type 2 diabetes mellitus with diabetic nephropathy: Secondary | ICD-10-CM

## 2022-04-30 NOTE — Patient Instructions (Signed)
Medication Instructions:  Your physician recommends that you continue on your current medications as directed. Please refer to the Current Medication list given to you today.  *If you need a refill on your cardiac medications before your next appointment, please call your pharmacy*   Lab Work: NONE If you have labs (blood work) drawn today and your tests are completely normal, you will receive your results only by: MyChart Message (if you have MyChart) OR A paper copy in the mail If you have any lab test that is abnormal or we need to change your treatment, we will call you to review the results.   Testing/Procedures: NONE   Follow-Up: At [facility], you and your health needs are our priority.  As part of our continuing mission to provide you with exceptional heart care, we have created designated Provider Care Teams.  These Care Teams include your primary Cardiologist (physician) and Advanced Practice Providers (APPs -  Physician Assistants and Nurse Practitioners) who all work together to provide you with the care you need, when you need it.   Your next appointment:   1 year(s)  Provider:   Mahesh Chandrasekhar, MD     

## 2022-05-03 ENCOUNTER — Ambulatory Visit (INDEPENDENT_AMBULATORY_CARE_PROVIDER_SITE_OTHER): Payer: Medicare Other

## 2022-05-03 VITALS — Ht 60.0 in | Wt 111.0 lb

## 2022-05-03 DIAGNOSIS — Z0000 Encounter for general adult medical examination without abnormal findings: Secondary | ICD-10-CM | POA: Diagnosis not present

## 2022-05-03 DIAGNOSIS — Z1231 Encounter for screening mammogram for malignant neoplasm of breast: Secondary | ICD-10-CM

## 2022-05-03 NOTE — Progress Notes (Signed)
Subjective:   Sarah [name] is a 77 y.o. female who presents for Medicare Annual (Subsequent) preventive examination.  Review of Systems    Virtual Visit via Telephone Note  I connected with  Sarah [name] on 05/03/22 at  9:30 AM EDT by telephone and verified that I am speaking with the correct person using two identifiers.  Location: Patient: Home Provider: Office Persons participating in the virtual visit: patient/Nurse Health Advisor   I discussed the limitations, risks, security and privacy concerns of performing an evaluation and management service by telephone and the availability of in person appointments. The patient expressed understanding and agreed to proceed.  Interactive audio and video telecommunications were attempted between this nurse and patient, however failed, due to patient having technical difficulties OR patient did not have access to video capability.  We continued and completed visit with audio only.  Some vital signs may be absent or patient reported.   Tillie Rung, LPN  Cardiac Risk Factors include: advanced age (>27men, >29 women);diabetes mellitus;hypertension     Objective:    Today's Vitals   05/03/22 0913  Weight: 111 lb (50.3 kg)  Height: 5' (1.524 m)   Body mass index is 21.68 kg/m.     05/03/2022    9:29 AM 11/19/2021    1:11 PM 11/05/2021   10:22 AM 10/24/2021    8:24 PM 06/15/2021    6:45 AM 04/29/2021    9:48 AM 09/26/2020    9:39 AM  Advanced Directives  Does Patient Have a Medical Advance Directive? No No No No No No No  Would patient like information on creating a medical advance directive? No - Patient declined No - Patient declined Yes (MAU/Ambulatory/Procedural Areas - Information given) No - Patient declined No - Patient declined No - Patient declined No - Patient declined    Current Medications (verified) Outpatient Encounter Medications as of 05/03/2022  Medication Sig   amLODipine (NORVASC) 5 MG tablet  Take 1 tablet (5 mg total) by mouth daily.   cholecalciferol (VITAMIN D3) 25 MCG (1000 UNIT) tablet Take 1,000 Units by mouth daily.   clopidogrel (PLAVIX) 75 MG tablet Take 1 tablet (75 mg total) by mouth daily.   Continuous Blood Gluc Sensor (DEXCOM G6 SENSOR) MISC Change sensors every 10 days   Continuous Blood Gluc Transmit (DEXCOM G6 TRANSMITTER) MISC Change every 90 days   ezetimibe (ZETIA) 10 MG tablet Take 1 tablet (10 mg total) by mouth daily.   FARXIGA 10 MG TABS tablet Take 1 tablet (10 mg total) by mouth daily.   Finerenone (KERENDIA) 10 MG TABS Take 10 mg by mouth daily.   furosemide (LASIX) 20 MG tablet Take 1 tablet by mouth daily (only as needed)   hydrALAZINE (APRESOLINE) 10 MG tablet Take 2 tablets (20 mg total) by mouth 3 (three) times daily.   insulin glargine, 1 Unit Dial, (TOUJEO SOLOSTAR) 300 UNIT/ML Solostar Pen Inject 16 Units into the skin daily in the afternoon.   Insulin Pen Needle 32G X 4 MM MISC 1 Device by Does not apply route in the morning, at noon, in the evening, and at bedtime.   latanoprost (XALATAN) 0.005 % ophthalmic solution Place 1 drop into both eyes at bedtime.   liraglutide (VICTOZA) 18 MG/3ML SOPN Inject 1.2 mg into the skin daily.   losartan (COZAAR) 50 MG tablet TAKE 1 TABLET(50 MG) BY MOUTH DAILY   nitroGLYCERIN (NITROSTAT) 0.4 MG SL tablet Place 1 tablet (0.4 mg total) under the tongue  every 5 (five) minutes as needed for chest pain. one tab every 5 minutes up to 3 tablets total over 15 minutes.   ondansetron (ZOFRAN) 4 MG tablet Take 1 tablet (4 mg total) by mouth 2 (two) times daily as needed for nausea or vomiting.   rosuvastatin (CRESTOR) 20 MG tablet Take 1 tablet (20 mg total) by mouth daily.   sertraline (ZOLOFT) 50 MG tablet TAKE 1 TABLET(50 MG) BY MOUTH DAILY   topiramate (TOPAMAX) 25 MG tablet Take 1 tablet (25 mg total) by mouth at bedtime.   No facility-administered encounter medications on file as of 05/03/2022.    Allergies  (verified) Patient has no known allergies.   History: Past Medical History:  Diagnosis Date   Bilateral carpal tunnel syndrome 03/27/2019   Boils    Glaucoma    Heart murmur    HTN (hypertension)    Hx of adenomatous colonic polyps    Hyperlipidemia    Osteoporosis    Pneumonia    Stroke    Type II or unspecified type diabetes mellitus without mention of complication, uncontrolled    Past Surgical History:  Procedure Laterality Date   ABDOMINAL HYSTERECTOMY     CARPAL TUNNEL RELEASE     right   COLONOSCOPY  12-10-10   per Dr. Arlyce Dice, clear, repeat in 7 yrs    CORONARY STENT INTERVENTION N/A 03/11/2020   Procedure: CORONARY STENT INTERVENTION;  Surgeon: Corky Crafts, MD;  Location: [facility] INVASIVE CV LAB;  Service: Cardiovascular;  Laterality: N/A;   CORONARY ULTRASOUND/IVUS N/A 03/11/2020   Procedure: Intravascular Ultrasound/IVUS;  Surgeon: Corky Crafts, MD;  Location: [facility] INVASIVE CV LAB;  Service: Cardiovascular;  Laterality: N/A;   INCISION AND DRAINAGE PERIRECTAL ABSCESS N/A 09/26/2015   Procedure: IRRIGATION AND DEBRIDEMENT PERIRECTAL ABSCESS;  Surgeon: Rodman Pickle, MD;  Location[facility] OR;  Service: General;  Laterality: N/A;   KNEE ARTHROSCOPY     right   LEFT HEART CATH AND CORONARY ANGIOGRAPHY N/A 03/11/2020   Procedure: LEFT HEART CATH AND CORONARY ANGIOGRAPHY;  Surgeon: Corky Crafts, MD;  Location: [facility] INVASIVE CV LAB;  Service: Cardiovascular;  Laterality: N/A;   LOOP RECORDER INSERTION N/A 10/19/2016   Procedure: LOOP RECORDER INSERTION;  Surgeon: Hillis Range, MD;  Location: [facility] INVASIVE CV LAB;  Service: Cardiovascular;  Laterality: N/A;   ORIF ANKLE FRACTURE Right 03/24/2012   Procedure: OPEN REDUCTION INTERNAL FIXATION (ORIF) ANKLE FRACTURE;  Surgeon: Nadara Mustard, MD;  Location: [facility] OR;  Service: Orthopedics;  Laterality: Right;  Open Reduction Internal Fixation Right Bimalleolar ankle fracture   POLYPECTOMY     TEE WITHOUT CARDIOVERSION N/A  10/18/2016   Procedure: TRANSESOPHAGEAL ECHOCARDIOGRAM (TEE);  Surgeon: Pricilla Riffle, MD;  Location: [facility] ENDOSCOPY;  Service: Cardiovascular;  Laterality: N/A;   TONSILLECTOMY     Family History  Problem Relation Age of Onset   Diabetes Mother    Hypertension Mother    Stroke Father    Stroke Maternal Uncle    Stroke Paternal Uncle    Colon cancer Neg Hx    Esophageal cancer Neg Hx    Rectal cancer Neg Hx    Stomach cancer Neg Hx    Inflammatory bowel disease Neg Hx    Liver disease Neg Hx    Pancreatic cancer Neg Hx    Social History   Socioeconomic History   Marital status: Single    Spouse name: Not on file   Number of children: Not on file   Years  of education: Not on file   Highest education level: Not on file  Occupational History   Not on file  Tobacco Use   Smoking status: Former    Types: Cigarettes    Quit date: 10/15/2016    Years since quitting: 5.5   Smokeless tobacco: Never   Tobacco comments:    smokes occ.   Vaping Use   Vaping Use: Never used  Substance and Sexual Activity   Alcohol use: Yes    Alcohol/week: 0.0 standard drinks of alcohol    Comment: occ   Drug use: No   Sexual activity: Not on file  Other Topics Concern   Not on file  Social History Narrative   Caffiene coffee 1 cup in am.   Retired from [employer].     Social Determinants of Health   Financial Resource Strain: Low Risk  (05/03/2022)   Overall Financial Resource Strain (CARDIA)    Difficulty of Paying Living Expenses: Not hard at all  Food Insecurity: No Food Insecurity (05/03/2022)   Hunger Vital Sign    Worried About Running Out of Food in the Last Year: Never true    Ran Out of Food in the Last Year: Never true  Transportation Needs: No Transportation Needs (05/03/2022)   PRAPARE - Administrator, Civil Service (Medical): No    Lack of Transportation (Non-Medical): No  Recent Concern: Transportation Needs - Unmet Transportation Needs (03/08/2022)    PRAPARE - Transportation    Lack of Transportation (Medical): Yes    Lack of Transportation (Non-Medical): Yes  Physical Activity: Insufficiently Active (05/03/2022)   Exercise Vital Sign    Days of Exercise per Week: 2 days    Minutes of Exercise per Session: 10 min  Stress: No Stress Concern Present (05/03/2022)   [employer] of Occupational Health - Occupational Stress Questionnaire    Feeling of Stress : Not at all  Social Connections: Moderately Integrated (05/03/2022)   Social Connection and Isolation Panel [NHANES]    Frequency of Communication with Friends and Family: More than three times a week    Frequency of Social Gatherings with Friends and Family: More than three times a week    Attends Religious Services: More than 4 times per year    Active Member of [employer] or Organizations: Yes    Attends Banker Meetings: More than 4 times per year    Marital Status: Widowed    Tobacco Counseling Counseling given: Not Answered Tobacco comments: smokes occ.    Clinical Intake:  Pre-visit preparation completed: No  Pain : No/denies pain     BMI - recorded: 21.68 Nutritional Status: BMI of 19-24  Normal Nutritional Risks: None Diabetes: Yes CBG done?: Yes CBG resulted in Enter/ Edit results?: Yes (CBG 237 Taken by patient) Did pt. bring in CBG monitor from home?: No  How often do you need to have someone help you when you read instructions, pamphlets, or other written materials from your doctor or pharmacy?: 3 - Sometimes (Family and aise assist)  Diabetic?  Yes  Interpreter Needed?: No Nutrition Risk Assessment:  Has the patient had any N/V/D within the last 2 months?  No  Does the patient have any non-healing wounds?  No  Has the patient had any unintentional weight loss or weight gain?  No   Diabetes:  Is the patient diabetic?  Yes  If diabetic, was a CBG obtained today?  Yes CBG 237 Taken by patient Did the patient bring  in their glucometer  from home?  No  How often do you monitor your CBG's? Daily/Monitor.   Financial Strains and Diabetes Management:  Are you having any financial strains with the device, your supplies or your medication? No .  Does the patient want to be seen by Chronic Care Management for management of their diabetes?  No  Would the patient like to be referred to a Nutritionist or for Diabetic Management?  No   Diabetic Exams:  Diabetic Eye Exam: Completed . Overdue for diabetic eye exam. Pt has been advised about the importance in completing this exam. A referral has been placed today. Message sent to referral coordinator for scheduling purposes. Advised pt to expect a call from office referred to regarding appt.  Diabetic Foot Exam: Completed . Pt has been advised about the importance in completing this exam. Pt is scheduled for diabetic foot exam on Followed by Dr Lesly Dukes   Information entered by :: Theresa Mulligan LPN   Activities of Daily Living    05/03/2022    9:24 AM 10/30/2021    3:31 PM  In your present state of health, do you have any difficulty performing the following activities:  Hearing? 0   Vision? 0   Difficulty concentrating or making decisions? 0   Walking or climbing stairs? 1   Comment Uses a cane and walker   Dressing or bathing? 0   Doing errands, shopping? 0   Preparing Food and eating ? N N  Using the Toilet? N N  In the past six months, have you accidently leaked urine? Y N  Comment Wears depends. Followed by [facility]   Do you have problems with loss of bowel control? N N  Managing your Medications? Y N  Comment Family and aide assist   Managing your Finances? Y N  Comment Family and aide assist   Housekeeping or managing your Housekeeping? Y N  Comment Family and aide assist     Patient Care Team: [place], [name], MD as PCP - General (Family Medicine) Christell Constant, MD as PCP - Cardiology (Cardiology) Nilda Riggs, NP as Nurse  Practitioner (Neurology) Judd Lien Charleston Ropes, [facility] (Inactive) as Pharmacist (Pharmacist) Bridgett Larsson, LCSW as Social Worker (Licensed Clinical Social Worker) Shamleffer, Konrad Dolores, MD as Attending Physician (Endocrinology) Collier Bullock as Physician Assistant (Cardiology) Ihor Austin, NP as Nurse Practitioner (Neurology) Tyler Pita, MD as Attending Physician (Internal Medicine) [name], [facility] (Pharmacist)  Indicate any recent Medical Services you may have received from other than [facility] providers in the past year (date may be approximate).     Assessment:   This is a routine wellness examination for Surena.  Hearing/Vision screen Hearing Screening - Comments:: Denies hearing difficulties   Vision Screening - Comments:: Wears rx glasses - up to date with routine eye exams with  Dr Clelia Croft  Dietary issues and exercise activities discussed: Exercise limited by: None identified   Goals Addressed               This Visit's Progress     Patient stated (pt-stated)        I just want to be stronger!       Depression Screen    05/03/2022    9:22 AM 02/05/2022    3:12 PM 11/20/2021   12:42 PM 11/02/2021    3:26 PM 10/20/2021    3:37 PM 09/29/2021    2:30 PM 05/26/2021    4:09  PM  PHQ 2/9 Scores  PHQ - 2 Score 0 0 0 0  PHQ- 9 Score   Fall Risk    05/03/2022    9:26 AM 02/05/2022    3:11 PM 11/20/2021   12:42 PM 11/02/2021    3:26 PM 10/20/2021    3:37 PM  Fall Risk   Falls in the past year? 1 0 Number falls in past yr: 0 0 1 0 0  Injury with Fall? 0 0 0 0 1  Risk for fall due to : No Fall Risks Impaired balance/gait History of fall(s) History of fall(s) History of fall(s)  Follow up Falls prevention discussed Falls evaluation completed Falls evaluation completed Falls evaluation completed Falls evaluation completed    FALL RISK PREVENTION PERTAINING TO THE HOME:  Any stairs in or around the home? Yes  If so,  are there any without handrails? No  Home free of loose throw rugs in walkways, pet beds, electrical cords, etc? Yes  Adequate lighting in your home to reduce risk of falls? Yes   ASSISTIVE DEVICES UTILIZED TO PREVENT FALLS:  Life alert? No  Use of a cane, walker or w/c? Yes  Grab bars in the bathroom? Yes  Shower chair or bench in shower? No  Elevated toilet seat or a handicapped toilet? No   TIMED UP AND GO:  Was the test performed? No . Audio Visit   Cognitive Function:        05/03/2022    9:29 AM 04/29/2021    9:49 AM 04/18/2020    8:50 AM  6CIT Screen  What Year? 0 points 0 points 0 points  What month? 0 points 0 points 0 points  What time? 0 points 0 points 0 points  Count back from 20 0 points 0 points 0 points  Months in reverse 0 points 0 points 0 points  Repeat phrase 0 points 0 points 0 points  Total Score 0 points 0 points 0 points    Immunizations Immunization History  Administered Date(s) Administered   Fluad Quad(high Dose 65+) 10/27/2018, 11/07/2019, 10/20/2021   Influenza, High Dose Seasonal PF 11/28/2014, 10/23/2015, 10/27/2016, 12/19/2017   Influenza,inj,Quad PF,6+ Mos 01/19/2013, 09/28/2013   PFIZER(Purple Top)SARS-COV-2 Vaccination 02/05/2019, 02/26/2019, 11/08/2019   Pneumococcal Conjugate-13 03/15/2014   Pneumococcal Polysaccharide-23 07/11/2013    TDAP status: Due, Education has been provided regarding the importance of this vaccine. Advised may receive this vaccine at local pharmacy or Health Dept. Aware to provide a copy of the vaccination record if obtained from local pharmacy or Health Dept. Verbalized acceptance and understanding.  Flu Vaccine status: Up to date  Pneumococcal vaccine status: Up to date  Covid-19 vaccine status: Completed vaccines  Qualifies for Shingles Vaccine? Yes   Zostavax completed No   Shingrix Completed?: No.    Education has been provided regarding the importance of this vaccine. Patient has been advised to  call insurance company to determine out of pocket expense if they have not yet received this vaccine. Advised may also receive vaccine at local pharmacy or Health Dept. Verbalized acceptance and understanding.  Screening Tests Health Maintenance  Topic Date Due   DTaP/Tdap/Td (1 - Tdap) Never done   FOOT EXAM  08/14/2020   MAMMOGRAM  11/27/2021   HEMOGLOBIN A1C  04/26/2022   OPHTHALMOLOGY EXAM  05/03/2022 (Originally 12/24/2021)   Diabetic kidney evaluation - Urine ACR  05/04/2022 (Originally 11/17/2021)   COVID-19  Vaccine (4 - 2023-24 season) 05/19/2022 (Originally 09/11/2021)   Zoster Vaccines- Shingrix (1 of 2) 08/02/2022 (Originally 08/01/1964)   INFLUENZA VACCINE  08/12/2022   Diabetic kidney evaluation - eGFR measurement  12/09/2022   Medicare Annual Wellness (AWV)  05/03/2023   COLONOSCOPY (Pts 45-74yrs Insurance coverage will need to be confirmed)  02/28/2028   Pneumonia Vaccine 9+ Years old  Completed   DEXA SCAN  Completed   Hepatitis C Screening  Completed   HPV VACCINES  Aged Out    Health Maintenance  Health Maintenance Due  Topic Date Due   DTaP/Tdap/Td (1 - Tdap) Never done   FOOT EXAM  08/14/2020   MAMMOGRAM  11/27/2021   HEMOGLOBIN A1C  04/26/2022    Colorectal cancer screening: Type of screening: Colonoscopy. Completed 02/27/21. Repeat every 7 years  Mammogram status: Ordered 05/03/22. Pt provided with contact info and advised to call to schedule appt.   Bone Density status: Completed 10/30/20. Results reflect: Bone density results: OSTEOPOROSIS. Repeat every   years.  Lung Cancer Screening: (Low Dose CT Chest recommended if Age 22-80 years, 30 pack-year currently smoking OR have quit w/in 15years.) does not qualify.     Additional Screening:  Hepatitis C Screening: does qualify; Completed 07/29/17  Vision Screening: Recommended annual ophthalmology exams for early detection of glaucoma and other disorders of the eye. Is the patient up to date with their  annual eye exam?  Yes  Who is the provider or what is the name of the office in which the patient attends annual eye exams? Dr Clelia Croft If pt is not established with a provider, would they like to be referred to a provider to establish care? No .   Dental Screening: Recommended annual dental exams for proper oral hygiene  Community Resource Referral / Chronic Care Management:  CRR required this visit?  No   CCM required this visit?  No      Plan:     I have personally reviewed and noted the following in the patient's chart:   Medical and social history Use of alcohol, tobacco or illicit drugs  Current medications and supplements including opioid prescriptions. Patient is not currently taking opioid prescriptions. Functional ability and status Nutritional status Physical activity Advanced directives List of other physicians Hospitalizations, surgeries, and ER visits in previous 12 months Vitals Screenings to include cognitive, depression, and falls Referrals and appointments  In addition, I have reviewed and discussed with patient certain preventive protocols, quality metrics, and best practice recommendations. A written personalized care plan for preventive services as well as general preventive health recommendations were provided to patient.     Tillie Rung, LPN   1/61/0960   Nurse Notes: Patient due Diabetic kidney evaluation-Urine ACR. Patient request f/u with concerns of advised recommendations for continued Physical Therapy.

## 2022-05-03 NOTE — Patient Instructions (Addendum)
[name] , Thank you for taking time to come for your Medicare Wellness Visit. I appreciate your ongoing commitment to your health goals. Please review the following plan we discussed and let me know if I can assist you in the future.   These are the goals we discussed:  Goals       Chronic Care Management      CARE PLAN ENTRY  Current Barriers:  Chronic Disease Management support, education, and care coordination needs related to Hypertension, Hyperlipidemia, and Diabetes   Hypertension Pharmacist Clinical Goal(s): Over the next 30 days, patient will work with PharmD and providers to achieve BP goal <130/80 Current regimen:  Amlodipine 10 mg 2 tablets daily AM Losartan 50 mg 1 tablet daily AM Interventions: Discussed the importance of low salt diet and increase physical activity Patient self care activities - Over the next 30 days, patient will: Check BP once daily, document, and provide at future appointments Ensure daily salt intake < 2300 mg/day Continue to walk outside 4 days a week and follow weekly recipe  Hyperlipidemia Pharmacist Clinical Goal(s): Over the next 30 days, patient will work with PharmD and providers to achieve LDL goal < 100 Current regimen:  Atorvastatin 80 mg 1 tablet daily HS  Ezetimibe 10 mg 1 tablet daily AM Interventions: Discussed the importance of heart healthy diet and increase physical activity Patient self care activities - Over the next 30 days, patient will: Continue to walk 4 days a week and follow weekly recipe  Diabetes Pharmacist Clinical Goal(s): Over the next 30 days, patient will work with PharmD and providers to achieve A1c goal <7% Current regimen:  Lantus 100units/mL 35 units AM and 30 units every 12 hrs (breakfast and dinner) Humalog 100 units/mL kwikpen 5-10 units 30 mins before meals  Metformin 500 mg 1 tablet BID with a meal  Victoza 18mg /18mL inject 1.8 mg daily at lunch Interventions: Encourage to increase physical  activity Benefits of switching from regular soda to diet soda Patient self care activities - Over the next 30 days, patient will: Check blood sugar 3-4 times daily, document, and provide at future appointments Contact provider with any episodes of hypoglycemia  Medication management Pharmacist Clinical Goal(s): Over the next 30 days, patient will work with PharmD and providers to maintain optimal medication adherence Current pharmacy: [facility] Interventions Comprehensive medication review performed. Continue current medication management strategy Patient self care activities - Over the next 30 days, patient will: Focus on medication adherence by pill boxes Take medications as prescribed Report any questions or concerns to PharmD and/or provider(s)  Initial goal documentation       Confirm and schedule nephrology post hospital visit [facility]) (pt-stated)      Care Coordination Interventions: Assessed the sister, [name] understanding of chronic kidney disease    Evaluation of current treatment plan related to chronic kidney disease self management and patient's adherence to plan as established by provider      Reviewed scheduled/upcoming provider appointments including    Discussed plans with patient for ongoing care management follow up and provided patient with direct contact information for care management team    Outreach to pcp [place] who reports it would fine  to wait until 12/30/21 for the nephrology/urology visit, her renal function seems to be stable. This appointment was confirmed to be missed as the patient has developed a cold.  Pending rescheduling as needed       Increase physical activity (pt-stated)      Walk more.  obtain copy of transportation application in paper form [facility] ) (pt-stated)      Outreached to [facility] SW to attempt to get a copy of the [place] [employer] application in order to get a paper copy  Mailed a copy to patient and her sister      Patient Stated      I  would like to get dental procedures done.       Patient stated (pt-stated)      I just want to be stronger!        This is a list of the screening recommended for you and due dates:  Health Maintenance  Topic Date Due   DTaP/Tdap/Td vaccine (1 - Tdap) Never done   Complete foot exam   08/14/2020   Mammogram  11/27/2021   Hemoglobin A1C  04/26/2022   Eye exam for diabetics  05/03/2022*   Yearly kidney health urinalysis for diabetes  05/04/2022*   COVID-19 Vaccine (4 - 2023-24 season) 05/19/2022*   Zoster (Shingles) Vaccine (1 of 2) 08/02/2022*   Flu Shot  08/12/2022   Yearly kidney function blood test for diabetes  12/09/2022   Medicare Annual Wellness Visit  05/03/2023   Colon Cancer Screening  02/28/2028   Pneumonia Vaccine  Completed   DEXA scan (bone density measurement)  Completed   Hepatitis C Screening: USPSTF Recommendation to screen - Ages 40-79 yo.  Completed   HPV Vaccine  Aged Out  *Topic was postponed. The date shown is not the original due date.    Advanced directives: Advance directive discussed with you today. Even though you declined this today, please call our office should you change your mind, and we can give you the proper paperwork for you to fill out.   Conditions/risks identified: None  Next appointment: Follow up in one year for your annual wellness visit    Preventive Care 65 Years and Older, Female Preventive care refers to lifestyle choices and visits with your health care provider that can promote health and wellness. What does preventive care include? A yearly physical exam. This is also called an annual well check. Dental exams once or twice a year. Routine eye exams. Ask your health care provider how often you should have your eyes checked. Personal lifestyle choices, including: Daily care of your teeth and gums. Regular physical activity. Eating a healthy diet. Avoiding tobacco and drug use. Limiting alcohol use. Practicing safe  sex. Taking low-dose aspirin every day. Taking vitamin and mineral supplements as recommended by your health care provider. What happens during an annual well check? The services and screenings done by your health care provider during your annual well check will depend on your age, overall health, lifestyle risk factors, and family history of disease. Counseling  Your health care provider may ask you questions about your: Alcohol use. Tobacco use. Drug use. Emotional well-being. Home and relationship well-being. Sexual activity. Eating habits. History of falls. Memory and ability to understand (cognition). Work and work Astronomer. Reproductive health. Screening  You may have the following tests or measurements: Height, weight, and BMI. Blood pressure. Lipid and cholesterol levels. These may be checked every 5 years, or more frequently if you are over 39 years old. Skin check. Lung cancer screening. You may have this screening every year starting at age 62 if you have a 30-pack-year history of smoking and currently smoke or have quit within the past 15 years. Fecal occult blood test (FOBT) of the stool. You may have this test every  year starting at age 104. Flexible sigmoidoscopy or colonoscopy. You may have a sigmoidoscopy every 5 years or a colonoscopy every 10 years starting at age 30. Hepatitis C blood test. Hepatitis B blood test. Sexually transmitted disease (STD) testing. Diabetes screening. This is done by checking your blood sugar (glucose) after you have not eaten for a while (fasting). You may have this done every 1-3 years. Bone density scan. This is done to screen for osteoporosis. You may have this done starting at age 71. Mammogram. This may be done every 1-2 years. Talk to your health care provider about how often you should have regular mammograms. Talk with your health care provider about your test results, treatment options, and if necessary, the need for more  tests. Vaccines  Your health care provider may recommend certain vaccines, such as: Influenza vaccine. This is recommended every year. Tetanus, diphtheria, and acellular pertussis (Tdap, Td) vaccine. You may need a Td booster every 10 years. Zoster vaccine. You may need this after age 37. Pneumococcal 13-valent conjugate (PCV13) vaccine. One dose is recommended after age 58. Pneumococcal polysaccharide (PPSV23) vaccine. One dose is recommended after age 25. Talk to your health care provider about which screenings and vaccines you need and how often you need them. This information is not intended to replace advice given to you by your health care provider. Make sure you discuss any questions you have with your health care provider. Document Released: 01/24/2015 Document Revised: 09/17/2015 Document Reviewed: 10/29/2014 Elsevier Interactive Patient Education  2017 [employer].  Fall Prevention in the Home Falls can cause injuries. They can happen to people of all ages. There are many things you can do to make your home safe and to help prevent falls. What can I do on the outside of my home? Regularly fix the edges of walkways and driveways and fix any cracks. Remove anything that might make you trip as you walk through a door, such as a raised step or threshold. Trim any bushes or trees on the path to your home. Use bright outdoor lighting. Clear any walking paths of anything that might make someone trip, such as rocks or tools. Regularly check to see if handrails are loose or broken. Make sure that both sides of any steps have handrails. Any raised decks and porches should have guardrails on the edges. Have any leaves, snow, or ice cleared regularly. Use sand or salt on walking paths during winter. Clean up any spills in your garage right away. This includes oil or grease spills. What can I do in the bathroom? Use night lights. Install grab bars by the toilet and in the tub and shower.  Do not use towel bars as grab bars. Use non-skid mats or decals in the tub or shower. If you need to sit down in the shower, use a plastic, non-slip stool. Keep the floor dry. Clean up any water that spills on the floor as soon as it happens. Remove soap buildup in the tub or shower regularly. Attach bath mats securely with double-sided non-slip rug tape. Do not have throw rugs and other things on the floor that can make you trip. What can I do in the bedroom? Use night lights. Make sure that you have a light by your bed that is easy to reach. Do not use any sheets or blankets that are too big for your bed. They should not hang down onto the floor. Have a firm chair that has side arms. You can use this  for support while you get dressed. Do not have throw rugs and other things on the floor that can make you trip. What can I do in the kitchen? Clean up any spills right away. Avoid walking on wet floors. Keep items that you use a lot in easy-to-reach places. If you need to reach something above you, use a strong step stool that has a grab bar. Keep electrical cords out of the way. Do not use floor polish or wax that makes floors slippery. If you must use wax, use non-skid floor wax. Do not have throw rugs and other things on the floor that can make you trip. What can I do with my stairs? Do not leave any items on the stairs. Make sure that there are handrails on both sides of the stairs and use them. Fix handrails that are broken or loose. Make sure that handrails are as long as the stairways. Check any carpeting to make sure that it is firmly attached to the stairs. Fix any carpet that is loose or worn. Avoid having throw rugs at the top or bottom of the stairs. If you do have throw rugs, attach them to the floor with carpet tape. Make sure that you have a light switch at the top of the stairs and the bottom of the stairs. If you do not have them, ask someone to add them for you. What else  can I do to help prevent falls? Wear shoes that: Do not have high heels. Have rubber bottoms. Are comfortable and fit you well. Are closed at the toe. Do not wear sandals. If you use a stepladder: Make sure that it is fully opened. Do not climb a closed stepladder. Make sure that both sides of the stepladder are locked into place. Ask someone to hold it for you, if possible. Clearly mark and make sure that you can see: Any grab bars or handrails. First and last steps. Where the edge of each step is. Use tools that help you move around (mobility aids) if they are needed. These include: Canes. Walkers. Scooters. Crutches. Turn on the lights when you go into a dark area. Replace any light bulbs as soon as they burn out. Set up your furniture so you have a clear path. Avoid moving your furniture around. If any of your floors are uneven, fix them. If there are any pets around you, be aware of where they are. Review your medicines with your doctor. Some medicines can make you feel dizzy. This can increase your chance of falling. Ask your doctor what other things that you can do to help prevent falls. This information is not intended to replace advice given to you by your health care provider. Make sure you discuss any questions you have with your health care provider. Document Released: 10/24/2008 Document Revised: 06/05/2015 Document Reviewed: 02/01/2014 Elsevier Interactive Patient Education  2017 [employer].

## 2022-05-05 ENCOUNTER — Telehealth: Payer: Self-pay | Admitting: Neurology

## 2022-05-05 NOTE — Telephone Encounter (Signed)
05/04/22 left VM [place] 04/14/22 [employer] medicare no auth req EE

## 2022-05-06 NOTE — Telephone Encounter (Signed)
[facility]- [employer] medicare no auth req   Patient is scheduled at [facility] For 07/13/22 at 9 pm.  Mailed packet to the patient.

## 2022-05-10 ENCOUNTER — Other Ambulatory Visit: Payer: Self-pay

## 2022-05-10 ENCOUNTER — Encounter: Payer: Self-pay | Admitting: Internal Medicine

## 2022-05-10 ENCOUNTER — Ambulatory Visit: Payer: Medicare Other | Admitting: Internal Medicine

## 2022-05-10 VITALS — BP 110/70 | HR 82 | Ht 60.0 in | Wt 112.0 lb

## 2022-05-10 DIAGNOSIS — Z794 Long term (current) use of insulin: Secondary | ICD-10-CM

## 2022-05-10 DIAGNOSIS — E1159 Type 2 diabetes mellitus with other circulatory complications: Secondary | ICD-10-CM | POA: Diagnosis not present

## 2022-05-10 DIAGNOSIS — E1142 Type 2 diabetes mellitus with diabetic polyneuropathy: Secondary | ICD-10-CM | POA: Diagnosis not present

## 2022-05-10 DIAGNOSIS — Z7984 Long term (current) use of oral hypoglycemic drugs: Secondary | ICD-10-CM | POA: Diagnosis not present

## 2022-05-10 DIAGNOSIS — E1165 Type 2 diabetes mellitus with hyperglycemia: Secondary | ICD-10-CM

## 2022-05-10 DIAGNOSIS — Z7985 Long-term (current) use of injectable non-insulin antidiabetic drugs: Secondary | ICD-10-CM | POA: Diagnosis not present

## 2022-05-10 LAB — POCT GLYCOSYLATED HEMOGLOBIN (HGB A1C): Hemoglobin A1C: 8.5 % — AB (ref 4.0–5.6)

## 2022-05-10 MED ORDER — DEXCOM G7 SENSOR MISC: 1.0000 | 3 refills | Status: DC

## 2022-05-10 MED ORDER — INSULIN LISPRO (1 UNIT DIAL) 100 UNIT/ML (KWIKPEN): PEN_INJECTOR | SUBCUTANEOUS | 3 refills | Status: DC

## 2022-05-10 MED ORDER — INSULIN PEN NEEDLE 32G X 4 MM MISC: 1.0000 | Freq: Four times a day (QID) | 3 refills | Status: DC

## 2022-05-10 MED ORDER — TOUJEO SOLOSTAR 300 UNIT/ML ~~LOC~~ SOPN: 14.0000 [IU] | PEN_INJECTOR | Freq: Every day | SUBCUTANEOUS | 3 refills | Status: DC

## 2022-05-10 MED ORDER — LIRAGLUTIDE 18 MG/3ML ~~LOC~~ SOPN: 1.2000 mg | PEN_INJECTOR | Freq: Every day | SUBCUTANEOUS | 1 refills | Status: DC

## 2022-05-10 MED ORDER — TOPIRAMATE 25 MG PO TABS: 25.0000 mg | ORAL_TABLET | Freq: Every evening | ORAL | 2 refills | Status: DC

## 2022-05-10 MED ORDER — DAPAGLIFLOZIN PROPANEDIOL 10 MG PO TABS: 10.0000 mg | ORAL_TABLET | Freq: Every day | ORAL | 3 refills | Status: DC

## 2022-05-10 NOTE — Progress Notes (Signed)
Name: Sarah [name]  Age/ Sex: 77 y.o., female   MRN/ DOB: [id number], Dec 30, 1945     PCP: [place], Sarah [name]   Reason for Endocrinology Evaluation: Type 2 Diabetes Mellitus  Initial Endocrine Consultative Visit: 08/15/2019    PATIENT IDENTIFIER: Sarah [name] is a 77 y.o. female with a past medical history of T2DM, CVA, Dyslipidemia  And CAD (S/P PCI 03/2020). The patient has followed with Endocrinology clinic since 08/15/2019 for consultative assistance with management of her diabetes.  DIABETIC HISTORY:  Sarah [name] was diagnosed with DM many years ago. Her hemoglobin A1c has ranged from 10.3% in 2020, peaking at 16.3% in 2016.   On her initial visit to our clinic she had an A1c of 12.5 % She was on MDI regimen but was not taking Metformin due to GI side effects so we stopped it. We adjusted MDI regimen and continued Victoza    Started Farxiga 02/2020    Lives with mother who is 92 yrs ago   She was hospitalized 10/2022 for acute CVA.  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 Continues to follow up with Sarah [name]  SUBJECTIVE:   During the last visit (11/03/2021): A1c 12.6 %    Today (05/10/2022): Sarah [name] is here for a follow up on diabetes management. She is accompanied by her sister ( Sarah [name])from [place] today .  She continues to follow-up with cardiology 04/30/2022 She was seen by neurology 04/05/2022, was diagnosed with OSA She has headaches   Denies nausea, or vomiting  Sarah [name] has been discontinued by nephrology ( La Russell [name] )    HOME DIABETES REGIMEN:  Victoza 1.2 mg daily  Toujeo 12  units daily Humalog 3 units with each meal  Farxiga 10 mg daily  Correction factor: Humalog (BG -130/30)     Statin: yes ACE-I/ARB: yes     CONTINUOUS  GLUCOSE MONITORING RECORD INTERPRETATION    Dates of Recording:4/16-4/29/2024  Sensor description: Dexcom  Results statistics:   CGM use % of time 57  Average and SD 303/80  Time in range 4%  % Time Above 180 30  % Time above 250 66  % Time Below target 0     Glycemic patterns summary: BG's are high during the night and trend up further during the day   Hyperglycemic episodes  All day and night   Hypoglycemic episodes n/a Overnight periods: above goal      DIABETIC COMPLICATIONS: Microvascular complications:  Retinopathy, neuropathy  Denies: CKD Last Eye Exam: Completed 12/24/2020  Macrovascular complications:  CVA, CAD (S/P PCI 03/2020 Denies: PVD   HISTORY:  Past Medical History:  Past Medical History:  Diagnosis Date   Bilateral carpal tunnel syndrome 03/27/2019   Boils    Glaucoma    Heart murmur    HTN (hypertension)    Hx of adenomatous colonic polyps    Hyperlipidemia    Osteoporosis    Pneumonia    Stroke (HCC)    Type II or unspecified type diabetes mellitus without mention of complication, uncontrolled    Past Surgical History:  Past Surgical History:  Procedure Laterality Date   ABDOMINAL HYSTERECTOMY     CARPAL TUNNEL RELEASE     right   COLONOSCOPY  12-10-10   per Dr. Arlyce Dice, clear, repeat in 7 yrs  CORONARY STENT INTERVENTION N/A 03/11/2020   Procedure: CORONARY STENT INTERVENTION;  Surgeon: Corky Crafts, [name];  Location: [facility] INVASIVE CV LAB;  Service: Cardiovascular;  Laterality: N/A;   CORONARY ULTRASOUND/IVUS N/A 03/11/2020   Procedure: Intravascular Ultrasound/IVUS;  Surgeon: Corky Crafts, [name];  Location: [facility] INVASIVE CV LAB;  Service: Cardiovascular;  Laterality: N/A;   INCISION AND DRAINAGE PERIRECTAL ABSCESS N/A 09/26/2015   Procedure: IRRIGATION AND DEBRIDEMENT PERIRECTAL ABSCESS;  Surgeon: Rodman Pickle, [name];  Location: [facility] OR;  Service: General;  Laterality: N/A;   KNEE ARTHROSCOPY     right   LEFT HEART CATH AND  CORONARY ANGIOGRAPHY N/A 03/11/2020   Procedure: LEFT HEART CATH AND CORONARY ANGIOGRAPHY;  Surgeon: Corky Crafts, [name];  Location: [facility] INVASIVE CV LAB;  Service: Cardiovascular;  Laterality: N/A;   LOOP RECORDER INSERTION N/A 10/19/2016   Procedure: LOOP RECORDER INSERTION;  Surgeon: Hillis Range, [name];  Location: [facility] INVASIVE CV LAB;  Service: Cardiovascular;  Laterality: N/A;   ORIF ANKLE FRACTURE Right 03/24/2012   Procedure: OPEN REDUCTION INTERNAL FIXATION (ORIF) ANKLE FRACTURE;  Surgeon: Nadara Mustard, [name];  Location: [facility] OR;  Service: Orthopedics;  Laterality: Right;  Open Reduction Internal Fixation Right Bimalleolar ankle fracture   POLYPECTOMY     TEE WITHOUT CARDIOVERSION N/A 10/18/2016   Procedure: TRANSESOPHAGEAL ECHOCARDIOGRAM (TEE);  Surgeon: Pricilla Riffle, [name];  Location: [facility] ENDOSCOPY;  Service: Cardiovascular;  Laterality: N/A;   TONSILLECTOMY     Social History:  reports that she quit smoking about 5 years ago. Her smoking use included cigarettes. She has never used smokeless tobacco. She reports current alcohol use. She reports that she does not use drugs. Family History:  Family History  Problem Relation Age of Onset   Diabetes Mother    Hypertension Mother    Stroke Father    Stroke Maternal Uncle    Stroke Paternal Uncle    Colon cancer Neg Hx    Esophageal cancer Neg Hx    Rectal cancer Neg Hx    Stomach cancer Neg Hx    Inflammatory bowel disease Neg Hx    Liver disease Neg Hx    Pancreatic cancer Neg Hx      HOME MEDICATIONS: Allergies as of 05/10/2022   No Known Allergies      Medication List        Accurate as of May 10, 2022 10:13 AM. If you have any questions, ask your nurse or doctor.          amLODipine 5 MG tablet Commonly known as: NORVASC Take 1 tablet (5 mg total) by mouth daily.   cholecalciferol 25 MCG (1000 UNIT) tablet Commonly known as: VITAMIN D3 Take 1,000 Units by mouth daily.   clopidogrel 75 MG tablet Commonly known as:  PLAVIX Take 1 tablet (75 mg total) by mouth daily.   Dexcom G6 Sensor Misc Change sensors every 10 days   Dexcom G6 Transmitter Misc Change every 90 days   ezetimibe 10 MG tablet Commonly known as: ZETIA Take 1 tablet (10 mg total) by mouth daily.   Farxiga 10 MG Tabs tablet Generic drug: dapagliflozin propanediol Take 1 tablet (10 mg total) by mouth daily.   furosemide 20 MG tablet Commonly known as: LASIX Take 1 tablet by mouth daily (only as needed)   hydrALAZINE 10 MG tablet Commonly known as: APRESOLINE Take 2 tablets (20 mg total) by mouth 3 (three) times daily.   Insulin Pen Needle 32G X 4 MM Misc 1 Device  by Does not apply route in the morning, at noon, in the evening, and at bedtime.   Kerendia 10 MG Tabs Generic drug: Finerenone Take 10 mg by mouth daily.   latanoprost 0.005 % ophthalmic solution Commonly known as: XALATAN Place 1 drop into both eyes at bedtime.   liraglutide 18 MG/3ML Sopn Commonly known as: VICTOZA Inject 1.2 mg into the skin daily.   losartan 50 MG tablet Commonly known as: COZAAR TAKE 1 TABLET(50 MG) BY MOUTH DAILY   nitroGLYCERIN 0.4 MG SL tablet Commonly known as: NITROSTAT Place 1 tablet (0.4 mg total) under the tongue every 5 (five) minutes as needed for chest pain. one tab every 5 minutes up to 3 tablets total over 15 minutes.   ondansetron 4 MG tablet Commonly known as: Zofran Take 1 tablet (4 mg total) by mouth 2 (two) times daily as needed for nausea or vomiting.   rosuvastatin 20 MG tablet Commonly known as: Crestor Take 1 tablet (20 mg total) by mouth daily.   sertraline 50 MG tablet Commonly known as: ZOLOFT TAKE 1 TABLET(50 MG) BY MOUTH DAILY   topiramate 25 MG tablet Commonly known as: Topamax Take 1 tablet (25 mg total) by mouth at bedtime.   Toujeo SoloStar 300 UNIT/ML Solostar Pen Generic drug: insulin glargine (1 Unit Dial) Inject 16 Units into the skin daily in the afternoon.         OBJECTIVE:    Vital Signs: BP 110/70 (BP Location: Left Arm, Patient Position: Sitting, Cuff Size: Small)   Pulse 82   Ht 5' (1.524 m)   Wt 112 lb (50.8 kg)   SpO2 97%   BMI 21.87 kg/m   Wt Readings from Last 3 Encounters:  05/10/22 112 lb (50.8 kg)  05/03/22 111 lb (50.3 kg)  04/30/22 111 lb 6.4 oz (50.5 kg)     Exam: General: Pt appears well and is in NAD  Lungs: Clear with good BS bilat with no rales, rhonchi, or wheezes  Heart: RRR   Extremities: No pretibial edema.   Neuro: MS is good with appropriate affect, pt is alert and Ox3   DM foot exam: 05/10/2022 The skin of the feet is intact without sores or ulcerations. The pedal pulses are 2+ on right and 2+ on left. The sensation is intact to a screening 5.07, 10 gram monofilament bilaterally    DATA REVIEWED:  Lab Results  Component Value Date   HGBA1C 8.5 (A) 05/10/2022   HGBA1C 12.6 (H) 10/25/2021   HGBA1C 11.7 (A) 08/31/2021    Latest Reference Range & Units 03/09/22 14:45  Total CHOL/HDL Ratio 0.0 - 4.4 ratio 1.8  Cholesterol, Total 100 - 199 mg/dL 161  HDL Cholesterol >09 mg/dL 90  Triglycerides 0 - 604 mg/dL 70  VLDL Cholesterol Cal 5 - 40 mg/dL 13  LDL Chol Calc (NIH) 0 - 99 mg/dL 63  Vitamin V40 981 - 1,914 pg/mL 384    Latest Reference Range & Units 12/08/21 13:51  Sodium 134 - 144 mmol/L 144  Potassium 3.5 - 5.2 mmol/L 4.1  Chloride 96 - 106 mmol/L 106  CO2 20 - 29 mmol/L 24  Glucose 70 - 99 mg/dL 782 (H)  BUN 8 - 27 mg/dL 15  Creatinine 9.56 - 2.13 mg/dL 0.86  Calcium 8.7 - 57.8 mg/dL 9.8  BUN/Creatinine Ratio 12 - 28  18  eGFR >59 mL/min/1.73 72  (H): Data is abnormally high    ASSESSMENT / PLAN / RECOMMENDATIONS:   1) Type 2  Diabetes Mellitus, Poorly controlled, With neuropathic and  macrovascular  complications - Most recent A1c of 8.5  %. Goal A1c < 7.0 %.     -Her A1c continues to be above goal, but it has trended down dramatically -In reviewing her CGM data, the patient has been noted with  severe hyperglycemia again during the day, her average BG's on CGM increased from 131 mg/DL to 161 mg/DL -Replace that the patient is back to drinking regular soda's, she has been drinking root beer, we again emphasized the importance of low-carb diet and avoiding sugar sweetened beverages.  We discussed the increased risk of amputations as one of her family member has had multiple -We had discontinued Humalog due to hypoglycemia, but now that she is back to baseline of eating, we will restart Humalog as below and she was also provided with a correction scale to be used before each meal -I will also increase Toujeo as she has been noted with persistent hyperglycemia overnight -We had decreased Victoza from 1.8 Mg to 1.2 Mg daily due to decreased appetite at some point, no change in the dose at this time, [facility] is not able to get it and they [facility] been [facility]   MEDICATIONS: - Continue Farxiga  10 mg, 1 tablet daily  -Continue Victoza 1.2 mg daily  -Increase Toujeo 14 units daily -Restart Humalog 4 units with meals  -Restart CF : Humalog (BG-130/30)    EDUCATION / INSTRUCTIONS: BG monitoring instructions: Patient is instructed to check her blood sugars 3 times a day, before meals  Call [facility] if: BG persistently < 70 I reviewed the Rule of 15 for the treatment of hypoglycemia in detail with the patient. Literature supplied.   2) Diabetic complications:  Eye: there's a questionable diabetic retinopathy from her history Neuro/ Feet: Does have known diabetic peripheral neuropathy .  Renal: Patient does not have known baseline CKD. She   is  on an ACEI/ARB at present.       F/U in 4 months    Signed electronically by: Lyndle Herrlich, [name]  [facility] Endocrinology  [facility] [address] [place] [place], [place] [address] Phone: [phone] FAX: [phone]   CC: [place], Sarah [name] [address]  [place] [address] Phone: [phone]  Fax: [phone]  Return to Endocrinology clinic as below: Future Appointments  Date Time Provider Department Center  06/25/2022  2:00 PM [name], RPH [facility] None  07/13/2022  9:00 PM [facility] SLEEP LAB [facility] None  09/07/2022  1:45 PM Ihor Austin, NP [facility] None  05/09/2023  9:30 AM [facility]-ANNUAL WELLNESS VISIT [facility]

## 2022-05-10 NOTE — Patient Instructions (Addendum)
-   Increase  Toujeo 14 units once daily  - Continue  Farxiga 10 mg, 1 tablet daily  - Continue  Victoza 1.2 mg daily  - Restart  Humalog 4 units with each meal PLUS the scale below if needed - Humalog correctional insulin: ADD extra units on insulin to your meal-time Humalog dose if your blood sugars are higher than 170. Use the scale below to help guide you:   Blood sugar before meal Number of units to inject  Less than 170 0 unit  171 - 210 1 units  211 - 250 2 units  251 - 290 3 units  291 - 330 4 units  331 - 370 5 units  371 - 410 6 units       HOW TO TREAT LOW BLOOD SUGARS (Blood sugar LESS THAN 70 MG/DL) Please follow the RULE OF 15 for the treatment of hypoglycemia treatment (when your (blood sugars are less than 70 mg/dL)   STEP 1: Take 15 grams of carbohydrates when your blood sugar is low, which includes:  3-4 GLUCOSE TABS  OR 3-4 OZ OF JUICE OR REGULAR SODA OR ONE TUBE OF GLUCOSE GEL    STEP 2: RECHECK blood sugar in 15 MINUTES STEP 3: If your blood sugar is still low at the 15 minute recheck --> then, go back to STEP 1 and treat AGAIN with another 15 grams of carbohydrates.

## 2022-05-24 ENCOUNTER — Telehealth: Payer: Self-pay | Admitting: Internal Medicine

## 2022-05-24 DIAGNOSIS — I6340 Cerebral infarction due to embolism of unspecified cerebral artery: Secondary | ICD-10-CM

## 2022-05-24 NOTE — Telephone Encounter (Signed)
Patient calling in about scheduling to have the loop recorder put in. Please advise

## 2022-05-24 NOTE — Telephone Encounter (Signed)
Called pt advised referral to see EP for ILR has been placed.  A scheduler will call to set up OV.

## 2022-05-31 ENCOUNTER — Ambulatory Visit: Payer: Medicare Other

## 2022-06-06 ENCOUNTER — Other Ambulatory Visit: Payer: Self-pay

## 2022-06-06 MED ORDER — DEXCOM G7 RECEIVER DEVI: 0 refills | Status: DC

## 2022-06-15 ENCOUNTER — Encounter: Payer: Medicare Other | Attending: Internal Medicine | Admitting: Nutrition

## 2022-06-15 DIAGNOSIS — Z794 Long term (current) use of insulin: Secondary | ICD-10-CM | POA: Insufficient documentation

## 2022-06-15 DIAGNOSIS — E1165 Type 2 diabetes mellitus with hyperglycemia: Secondary | ICD-10-CM | POA: Insufficient documentation

## 2022-06-15 NOTE — Patient Instructions (Addendum)
Change sensor every 10 days Call Dexcom help line if questions. Read over manual.

## 2022-06-15 NOTE — Progress Notes (Signed)
Patient was trained on the use of the Dexcom G7 sensor.  We discussed the difference between sensor readings and blood sugar readings.  She did not want her readings to go to her phone.  She preferred the reader.  This was set with date/time, and she was show how to insert and start the sensor.  She was also show how to remove it in 10 days.  Questions answered about food choices for breakfast and how much orange juice she can drink.   She had no final questions.

## 2022-06-17 ENCOUNTER — Ambulatory Visit: Payer: Medicare Other

## 2022-06-18 ENCOUNTER — Ambulatory Visit
Admission: RE | Admit: 2022-06-18 | Discharge: 2022-06-18 | Disposition: A | Discharge status: Home / Self Care | Payer: Medicare Other | Source: Ambulatory Visit | Attending: Family Medicine | Admitting: Family Medicine

## 2022-06-18 DIAGNOSIS — Z1231 Encounter for screening mammogram for malignant neoplasm of breast: Secondary | ICD-10-CM

## 2022-06-21 ENCOUNTER — Telehealth: Payer: Self-pay | Admitting: Family Medicine

## 2022-06-21 DIAGNOSIS — M545 Low back pain, unspecified: Secondary | ICD-10-CM

## 2022-06-21 DIAGNOSIS — D229 Melanocytic nevi, unspecified: Secondary | ICD-10-CM

## 2022-06-21 NOTE — Telephone Encounter (Signed)
I spoke with patient. She is aware that both referrals have been placed and she will receive phone calls to schedule appointments.

## 2022-06-21 NOTE — Telephone Encounter (Signed)
Pt called to say she would like to extend her PT  [facility])  Also, Pt states she has 2 huge moles on both breasts and she is requesting a referral to see the dermatologist.   Pt would like a call back to discuss.  LOV:  02/05/22

## 2022-06-25 ENCOUNTER — Other Ambulatory Visit: Payer: Medicare Other

## 2022-06-25 DIAGNOSIS — I119 Hypertensive heart disease without heart failure: Secondary | ICD-10-CM

## 2022-06-25 NOTE — Progress Notes (Signed)
06/27/2022 Name: Sarah [name] MRN: [id number] DOB: 1945-11-28  Chief Complaint  Patient presents with   Medication Management   Sarah [name] is a 77 y.o. year old female who presented for a telephone visit along with her sister (DPR) to establish care with [facility] PharmD.  Patient was previously followed by [facility].  Subjective:  Care Team: Primary Care Provider: [place], [name], MD  Endocrinologist Dr. Lonzo Cloud; Next Scheduled Visit: 10/25/22  Medication Access/Adherence Current Pharmacy:  [facility] - [place], [place] - [address] [place] [address] Phone: [phone] Fax: [phone]  [facility] [phone] [place], [place] - [address] [place] [address] Phone: [phone] Fax: [phone]  -Patient reports affordability concerns with their medications: No  -Patient reports access/transportation concerns to their pharmacy: No  -Patient reports adherence concerns with their medications:  No    Diabetes: Current medications: Farxiga 10mg  daily, Toujeo using 12 units daily, Humalog taking 4 units TID before meals, Victoza 1.2mg  daily -Using Dexcom G7 for CGM but unable to provide recent readings, because reader is telling patient it is time to change sensors -Patient denies hypoglycemic s/sx including dizziness, shakiness, sweating.  -Patient denies hyperglycemic symptoms including polyuria, polydipsia, polyphagia, nocturia, neuropathy, blurred vision.  Hypertension: Current medications: amlodipine 5mg  daily, furosemide 20mg  taking 2 tablets daily, hydralazine 10mg  taking 2 tablets BID, losartan 50mg  daily -Furosemide 20mg  prescribed to be used PRN, but patient is using 2 tablets daily for fluid retention in feet/ankles -Hydralazine 10mg   prescribed to take 2 tablets TID, but patient only taking BID due to timing of  doses  -Patient has a validated, automated, upper arm home BP cuff but does not check regularly -Recent clinic reading 110/70 -Patient denies hypotensive s/sx including dizziness, lightheadedness.  -Patient denies hypertensive symptoms including headache, chest pain, shortness of breath  Hyperlipidemia/ASCVD Risk Reduction Current lipid lowering medications: rosuvastatin 20mg  daily  Medication Management: Current adherence strategy: Medications in weekly pill organizer and home healthcare administers medications to patient in morning and evening -Patient states Marylene Land ([place] PharmD) was going to discuss Preservision supplement with Dr. [place], because she states vision is very poor at this time.    Objective: Lab Results  Component Value Date   HGBA1C 8.5 (A) 05/10/2022   Lab Results  Component Value Date   CREATININE 0.85 12/08/2021   BUN 15 12/08/2021   NA 144 12/08/2021   K 4.1 12/08/2021   CL 106 12/08/2021   CO2 24 12/08/2021   Lab Results  Component Value Date   CHOL 166 03/09/2022   HDL 90 03/09/2022   LDLCALC 63 03/09/2022   TRIG 70 03/09/2022   CHOLHDL 1.8 03/09/2022   Medications Reviewed Today     Reviewed by Lenna Gilford, RPH (Pharmacist) on 06/25/22 at 1328  Med List Status: <None>   Medication Order Taking? Sig Documenting Provider Last Dose Status Informant  amLODipine (NORVASC) 5 MG tablet [id number] Yes Take 1 tablet (5 mg total) by mouth daily. [place], [name], MD Taking Active   cholecalciferol (VITAMIN D3) 25 MCG (1000 UNIT) tablet [id number] Yes Take 1,000 Units by mouth daily. [provider] Taking Active   clopidogrel (PLAVIX) 75 MG tablet [id number] Yes Take 1 tablet (75 mg total) by mouth daily. [place], [name], MD Taking Active   Continuous Glucose Receiver [facility] G7 RECEIVER) [place] [id number] Yes USE TO MONITOR  GLUCOSE Shamleffer, Konrad Dolores, MD Taking Active   Continuous Glucose Sensor (DEXCOM G7 SENSOR) MISC [id number] Yes 1 Device  by Does not apply route as directed. Shamleffer, Konrad Dolores, MD Taking Active   dapagliflozin propanediol (FARXIGA) 10 MG TABS tablet [id number] Yes Take 1 tablet (10 mg total) by mouth daily. Shamleffer, Konrad Dolores, MD Taking Active   ezetimibe (ZETIA) 10 MG tablet [id number] Yes Take 1 tablet (10 mg total) by mouth daily. [place], [name], MD Taking Active   furosemide (LASIX) 20 MG tablet [id number] Yes Take 1 tablet by mouth daily (only as needed) Dyann Kief, PA-C Taking Active            Med Note Littie Deeds, Boruch Manuele A   Fri Jun 25, 2022  1:12 PM) Taking 2 tablets daily  hydrALAZINE (APRESOLINE) 10 MG tablet [id number] Yes Take 2 tablets (20 mg total) by mouth 3 (three) times daily. [place], [name], MD Taking Active            Med Note Littie Deeds, [place] A   Fri Jun 25, 2022  1:16 PM) Only taking BID  insulin glargine, 1 Unit Dial, (TOUJEO SOLOSTAR) 300 UNIT/ML Solostar Pen [id number] Yes Inject 14 Units into the skin daily in the afternoon. Shamleffer, Konrad Dolores, MD Taking Active            Med Note Littie Deeds, [place] A   Fri Jun 25, 2022  1:19 PM) 12 units  insulin lispro (HUMALOG KWIKPEN) 100 UNIT/ML KwikPen [id number] Yes Max daily 30 units Shamleffer, Konrad Dolores, MD Taking Active            Med Note Littie Deeds, [place] A   Fri Jun 25, 2022  1:19 PM) 4 units TID before meals  Insulin Pen Needle 32G X 4 MM MISC [id number] Yes 1 Device by Does not apply route in the morning, at noon, in the evening, and at bedtime. Shamleffer, Konrad Dolores, MD Taking Active   latanoprost (XALATAN) 0.005 % ophthalmic solution [id number] Yes Place 1 drop into both eyes at bedtime. [provider] Taking Active Self  liraglutide (VICTOZA) 18 MG/3ML SOPN [id number] Yes Inject 1.2 mg into the skin daily. Shamleffer, Konrad Dolores, MD Taking Active   losartan (COZAAR) 50 MG tablet [id number] Yes TAKE 1 TABLET(50 MG) BY MOUTH DAILY [place], [name], MD Taking Active   Magnesium 250 MG TABS [id number]  Yes Take 250 mg by mouth daily. [provider] Taking Active   nitroGLYCERIN (NITROSTAT) 0.4 MG SL tablet [id number] Yes Place 1 tablet (0.4 mg total) under the tongue every 5 (five) minutes as needed for chest pain. one tab every 5 minutes up to 3 tablets total over 15 minutes. Christell Constant, MD Taking Active Self           Med Note Darrick Meigs   JSE Oct 24, 2021  8:13 AM) Has not used. Keeps on hand PRN  ondansetron (ZOFRAN) 4 MG tablet [id number] Yes Take 1 tablet (4 mg total) by mouth 2 (two) times daily as needed for nausea or vomiting. [place], [name], MD Taking Active            Med Note Littie Deeds, [place] A   Fri Jun 25, 2022  1:27 PM) Has on hand if needed but has not used recently  rosuvastatin (CRESTOR) 20 MG tablet [id number] Yes Take 1 tablet (20 mg total) by mouth daily. [place], [name], MD Taking Active   sertraline (ZOLOFT) 50 MG tablet [id number] Yes TAKE 1  TABLET(50 MG) BY MOUTH DAILY [place], [name], MD Taking Active   topiramate (TOPAMAX) 25 MG tablet [id number] Yes Take 1 tablet (25 mg total) by mouth at bedtime. Ihor Austin, NP Taking Active            Assessment/Plan:   Diabetes: - Currently uncontrolled - Patient is followed by Dr. Lonzo Cloud, so I will default DM management to provider - Recommend to check glucose continuously with Dexcom G7 sensor/reader  Hypertension: - Currently controlled - Recommend to check home BP at least once weekly and record  - Recommend PCP send updated directions for use of furosemide and hydralazine based on how patient is currently taking.  Regimen appears to be effective, and this will prevent failed adherence metrics for insurance pharmacy claims.  Prescriptions pending for Dr. [place] to sign if she agrees.  Hyperlipidemia/ASCVD Risk Reduction: - Currently controlled.  - Continue current regimen and regular follow-up with providers  Medication Management: - Currently strategy sufficient to maintain  appropriate adherence to prescribed medication regimen - I do not see notes reflecting initiation of Preservision but will consult Dr. [place].  To note, vision loss seems to be related to patient's previous stroke.Marland Kitchen  Preservision is typically used for Macular Degeneration, so I doubt much benefit relative to cost would present in Ms. Gewirtz.  Follow Up Plan: None scheduled at this time but can as needed per PCP.  Name and direct number were also provided to patient/sister.  Lenna Gilford, PharmD, DPLA

## 2022-06-29 ENCOUNTER — Ambulatory Visit: Payer: Medicare Other | Attending: Cardiovascular Disease | Admitting: Cardiovascular Disease

## 2022-06-29 ENCOUNTER — Encounter: Payer: Self-pay | Admitting: Cardiovascular Disease

## 2022-06-29 VITALS — BP 130/60 | HR 76 | Ht 60.0 in | Wt 115.4 lb

## 2022-06-29 DIAGNOSIS — I6340 Cerebral infarction due to embolism of unspecified cerebral artery: Secondary | ICD-10-CM | POA: Diagnosis not present

## 2022-06-29 NOTE — Progress Notes (Signed)
Electrophysiology Office Note:    Date:  06/29/2022   ID:  Sarah [name], DOB 04/26/1945, MRN [id number]  PCP:  [place], [name], MD    [facility] Providers Cardiologist:  Christell Constant, MD Cardiology APP:  Col[facility] Bullock     Referring MD: Riley Lam A*   History of Present Illness:    Sarah [name] is a 77 y.o. female with a medical history significant for stroke concerning for a cardioembolic source, referred for loop placement.     She has a history of prior stroke in 2018.  A loop recorder was placed but did not reveal atrial fibrillation.  She has again had cryptogenic stroke; MRI in October 2023 showed a pattern concerning for cardioembolic etiology.       EKGs/Labs/Other Studies Reviewed Today:    Echocardiogram:  Reviewed in Epic   Monitors:  [employer] monitor 11/2021 - my interpretation Sinus rhythm, no atrial fibrillation   Stress testing:   Advanced imaging:    Cardiac catherization    EKG:   EKG Interpretation  Date/Time:  Tuesday June 29 2022 12:58:00 EDT Ventricular Rate:  76 PR Interval:  184 QRS Duration: 76 QT Interval:  366 QTC Calculation: 411 R Axis:   47 Text Interpretation: Normal sinus rhythm Cannot rule out Anterior infarct , age undetermined When compared with ECG of 19-Nov-2021 13:32, PREVIOUS ECG IS PRESENT Confirmed by York Pellant [phone]) on 06/29/2022 1:30:36 PM     Physical Exam:    VS:  BP 130/60   Pulse 76   Ht 5' (1.524 m)   Wt 115 lb 6.4 oz (52.3 kg)   SpO2 99%   BMI 22.54 kg/m     Wt Readings from Last 3 Encounters:  06/29/22 115 lb 6.4 oz (52.3 kg)  05/10/22 112 lb (50.8 kg)  05/03/22 111 lb (50.3 kg)     GEN:  Well nourished, well developed in no acute distress CARDIAC: RRR, no murmurs, rubs, gallops RESPIRATORY:  Normal work of breathing MUSCULOSKELETAL: no edema    ASSESSMENT & PLAN:    Recurrent stroke CMR concerning for cardioembolic  etiology Prior ILR years ago did not show evidence of atrial fibrillation Due to the new findings I think it reasonable to replace the implantable loop recorder I discussed the indication, the risks including infection and bleeding, the monitoring process including the monthly fee.  She would like to proceed  Presence of implantable loop monitor at EOL We will remove the prior loop recorder at the time of implantation with a new loop recorder.   PROCEDURES:   1.  Explant of a loop recorder at EOL  2. Implantable loop recorder implantation       DESCRIPTION OF PROCEDURE:  Informed written consent was obtained.  A timeout was performed. The patient required no sedation for the procedure today.  The patients left chest was prepped and draped in the usual sterile fashion. The skin overlying the left parasternal region was infiltrated with lidocaine for local analgesia.  A 0.5-cm incision was made over the left parasternal region over the 3rd intercostal space.  A Medtronic implantable loop recorder was then placed.  R waves were very prominent and measured >0.55mV.  Steri- Strips and a sterile dressing were then applied.  There were no early apparent complications.     CONCLUSIONS:   1.  Successful explantation of a loop recorder  2. Successful implantation of a Medtronic implantable loop recorder (SN# [id number]) for a history of  cryptogenic stroke  3. No early apparent complications.     Signed, Maurice Small, MD  06/29/2022 1:48 PM    [facility]

## 2022-06-29 NOTE — Progress Notes (Deleted)
HPI:  [name] is a 77 y.o. female, who is here today to follow on recent visit.  Review of Systems See other pertinent positives and negatives in HPI.  Current Outpatient Medications on File Prior to Visit  Medication Sig Dispense Refill   amLODipine (NORVASC) 5 MG tablet Take 1 tablet (5 mg total) by mouth daily. 90 tablet 3   cholecalciferol (VITAMIN D3) 25 MCG (1000 UNIT) tablet Take 1,000 Units by mouth daily.     clopidogrel (PLAVIX) 75 MG tablet Take 1 tablet (75 mg total) by mouth daily. 90 tablet 1   Continuous Glucose Receiver (DEXCOM G7 RECEIVER) DEVI USE TO MONITOR GLUCOSE 1 each 0   Continuous Glucose Sensor (DEXCOM G7 SENSOR) MISC 1 Device by Does not apply route as directed. 9 each 3   dapagliflozin propanediol (FARXIGA) 10 MG TABS tablet Take 1 tablet (10 mg total) by mouth daily. 90 tablet 3   ezetimibe (ZETIA) 10 MG tablet Take 1 tablet (10 mg total) by mouth daily. 90 tablet 3   furosemide (LASIX) 20 MG tablet Take 1 tablet by mouth daily (only as needed) 90 tablet 3   hydrALAZINE (APRESOLINE) 10 MG tablet Take 2 tablets (20 mg total) by mouth 3 (three) times daily. 240 tablet 3   insulin glargine, 1 Unit Dial, (TOUJEO SOLOSTAR) 300 UNIT/ML Solostar Pen Inject 14 Units into the skin daily in the afternoon. 15 mL 3   insulin lispro (HUMALOG KWIKPEN) 100 UNIT/ML KwikPen Max daily 30 units 30 mL 3   Insulin Pen Needle 32G X 4 MM MISC 1 Device by Does not apply route in the morning, at noon, in the evening, and at bedtime. 400 each 3   latanoprost (XALATAN) 0.005 % ophthalmic solution Place 1 drop into both eyes at bedtime.     liraglutide (VICTOZA) 18 MG/3ML SOPN Inject 1.2 mg into the skin daily. 9 mL 1   losartan (COZAAR) 50 MG tablet TAKE 1 TABLET(50 MG) BY MOUTH DAILY 90 tablet 1   Magnesium 250 MG TABS Take 250 mg by mouth daily.     nitroGLYCERIN (NITROSTAT) 0.4 MG SL tablet Place 1 tablet (0.4 mg total) under the tongue every 5 (five) minutes as  needed for chest pain. one tab every 5 minutes up to 3 tablets total over 15 minutes. 25 tablet 3   ondansetron (ZOFRAN) 4 MG tablet Take 1 tablet (4 mg total) by mouth 2 (two) times daily as needed for nausea or vomiting. 60 tablet 1   rosuvastatin (CRESTOR) 20 MG tablet Take 1 tablet (20 mg total) by mouth daily. 90 tablet 3   sertraline (ZOLOFT) 50 MG tablet TAKE 1 TABLET(50 MG) BY MOUTH DAILY 90 tablet 1   topiramate (TOPAMAX) 25 MG tablet Take 1 tablet (25 mg total) by mouth at bedtime. 30 tablet 2   No current facility-administered medications on file prior to visit.    Past Medical History:  Diagnosis Date   Bilateral carpal tunnel syndrome 03/27/2019   Boils    Glaucoma    Heart murmur    HTN (hypertension)    Hx of adenomatous colonic polyps    Hyperlipidemia    Osteoporosis    Pneumonia    Stroke (HCC)    Type II or unspecified type diabetes mellitus without mention of complication, uncontrolled    No Known Allergies  Social History   Socioeconomic History   Marital status: Single    Spouse name: Not on file   Number of  children: Not on file   Years of education: Not on file   Highest education level: Not on file  Occupational History   Not on file  Tobacco Use   Smoking status: Former    Types: Cigarettes    Quit date: 10/15/2016    Years since quitting: 5.7   Smokeless tobacco: Never   Tobacco comments:    smokes occ.   Vaping Use   Vaping Use: Never used  Substance and Sexual Activity   Alcohol use: Yes    Alcohol/week: 0.0 standard drinks of alcohol    Comment: occ   Drug use: No   Sexual activity: Not on file  Other Topics Concern   Not on file  Social History Narrative   Caffiene coffee 1 cup in am.   Retired from [employer].     Social Determinants of Health   Financial Resource Strain: Low Risk  (05/03/2022)   Overall Financial Resource Strain (CARDIA)    Difficulty of Paying Living Expenses: Not hard at all  Food Insecurity: No  Food Insecurity (05/03/2022)   Hunger Vital Sign    Worried About Running Out of Food in the Last Year: Never true    Ran Out of Food in the Last Year: Never true  Transportation Needs: No Transportation Needs (05/03/2022)   PRAPARE - Administrator, Civil Service (Medical): No    Lack of Transportation (Non-Medical): No  Recent Concern: Transportation Needs - Unmet Transportation Needs (03/08/2022)   PRAPARE - Transportation    Lack of Transportation (Medical): Yes    Lack of Transportation (Non-Medical): Yes  Physical Activity: Insufficiently Active (05/03/2022)   Exercise Vital Sign    Days of Exercise per Week: 2 days    Minutes of Exercise per Session: 10 min  Stress: No Stress Concern Present (05/03/2022)   [employer] of Occupational Health - Occupational Stress Questionnaire    Feeling of Stress : Not at all  Social Connections: Moderately Integrated (05/03/2022)   Social Connection and Isolation Panel [NHANES]    Frequency of Communication with Friends and Family: More than three times a week    Frequency of Social Gatherings with Friends and Family: More than three times a week    Attends Religious Services: More than 4 times per year    Active Member of [employer] or Organizations: Yes    Attends Banker Meetings: More than 4 times per year    Marital Status: Widowed    There were no vitals filed for this visit. There is no height or weight on file to calculate BMI.  Physical Exam  ASSESSMENT AND PLAN:  There are no diagnoses linked to this encounter.  No orders of the defined types were placed in this encounter.   No problem-specific Assessment & Plan notes found for this encounter.   No follow-ups on file.  [name]. [place], MD  [facility]. [place] office.

## 2022-06-29 NOTE — Patient Instructions (Signed)
Medication Instructions:  Your physician recommends that you continue on your current medications as directed. Please refer to the Current Medication list given to you today.  Labwork: None ordered.  Testing/Procedures: None ordered.  Follow-Up:  Your physician wants you to follow-up in: one year with Dr. Augustus Mealor.  You will receive a reminder letter in the mail two months in advance. If you don't receive a letter, please call our office to schedule the follow-up appointment.  Implantable Loop Recorder Placement, Care After This sheet gives you information about how to care for yourself after your procedure. Your health care provider may also give you more specific instructions. If you have problems or questions, contact your health care provider.  What can I expect after the procedure? After the procedure, it is common to have: Soreness or discomfort near the incision. Some swelling or bruising near the incision.  Follow these instructions at home: Incision care  Monitor your cardiac device site for redness, swelling, and drainage. Call the device clinic at [phone] if you experience these symptoms or fever/chills.  Keep the large square bandage on your site for 24 hours and then you may remove it yourself. Keep the steri-strips underneath in place.   You may shower after 72 hours / 3 days from your procedure with the steri-strips in place. They will usually fall off on their own, or may be removed after 10 days. Pat dry.   Avoid lotions, ointments, or perfumes over your incision until it is well-healed.  Please do not submerge in water until your site is completely healed.   Your device is MRI compatible.   Remote monitoring is used to monitor your cardiac device from home. This monitoring is scheduled every month by our office. It allows us to keep an eye on the function of your device to ensure it is working properly.  If your wound site starts to bleed apply  pressure.    For help with the monitor please call Medtronic Monitor Support Specialist directly at [phone].    If you have any questions/concerns please call the device clinic at [phone].  Activity  Return to your normal activities.  General instructions Follow instructions from your health care provider about how to manage your implantable loop recorder and transmit the information. Learn how to activate a recording if this is necessary for your type of device. You may go through a metal detection gate, and you may let someone hold a metal detector over your chest. Show your ID card if needed. Do not have an MRI unless you check with your health care provider first. Take over-the-counter and prescription medicines only as told by your health care provider. Keep all follow-up visits as told by your health care provider. This is important. Contact a health care provider if: You have redness, swelling, or pain around your incision. You have a fever. You have pain that is not relieved by your pain medicine. You have triggered your device because of fainting (syncope) or because of a heartbeat that feels like it is racing, slow, fluttering, or skipping (palpitations). Get help right away if you have: Chest pain. Difficulty breathing. Summary After the procedure, it is common to have soreness or discomfort near the incision. Change your dressing as told by your health care provider. Follow instructions from your health care provider about how to manage your implantable loop recorder and transmit the information. Keep all follow-up visits as told by your health care provider. This is important. This information   is not intended to replace advice given to you by your health care provider. Make sure you discuss any questions you have with your health care provider. Document Released: 12/09/2014 Document Revised: 02/12/2017 Document Reviewed: 02/12/2017 Elsevier Patient Education   2020 Elsevier Inc.  

## 2022-06-30 ENCOUNTER — Ambulatory Visit: Payer: Medicare Other | Admitting: Family Medicine

## 2022-06-30 NOTE — Progress Notes (Signed)
HPI: [name] is a 77 y.o. female with PMHx significant for CAD,CVA,poorly controlled DM II, CKD II, anxiety, PAD, and GERD here today to follow on physical therapy.  She needs a new referral to PT through [facility], she was doing outpt PT but now she cannot drive, so she needs to continue PT at home.  She reports that she had a new loop recorder implanted yesterday.  Previous loop recorder was negative for atrial fibrillation or other serious arrhythmia.  Ischemic CVA and chronic headaches: Evaluated by neurologist on 03/09/2022, she is now on Topamax 25 mg daily at bedtime. She has not concerns about headaches today. Pending sleep study to evaluate for OSA. She mentions that she has not been able to complete dental work because of Plavix. Anxiety on sertraline 50 mg daily. Medication still helping. She is no longer on tramadol.  DM II: Reports improvement. Lab Results  Component Value Date   HGBA1C 8.5 (A) 05/10/2022   She is now  using a Dexcom G7. Following with endocrinologist.  She is experiencing increased urinary frequency, particularly at night, and sometimes she is unable to hold it long enough to reach the bathroom.  Problem has been going on for some time, she is planning on arranging appointment with nephrology sooner than planned to discuss this problem. Last visit 05/19/22. She is not longer on [place] due to cost. Currently she is on furosemide 40 mg 2 tablets daily. She denies any dysuria or gross hematuria.  She is also inquiring about appointment with dermatologist, referral was placed on 06/21/2022.  She is concerned about "moles" under breast, which she has had for years, slowly growing.  Review of Systems  Constitutional:  Positive for fatigue. Negative for chills and fever.  HENT:  Negative for mouth sores and sore throat.   Respiratory:  Negative for cough, shortness of breath and wheezing.   Cardiovascular:  Negative for chest pain and palpitations.   Gastrointestinal:  Negative for abdominal pain, nausea and vomiting.  Genitourinary:  Negative for decreased urine volume and flank pain.  Neurological:  Negative for seizures and syncope.  See other pertinent positives and negatives in HPI.  Current Outpatient Medications on File Prior to Visit  Medication Sig Dispense Refill   amLODipine (NORVASC) 5 MG tablet Take 1 tablet (5 mg total) by mouth daily. 90 tablet 3   cholecalciferol (VITAMIN D3) 25 MCG (1000 UNIT) tablet Take 1,000 Units by mouth daily.     clopidogrel (PLAVIX) 75 MG tablet Take 1 tablet (75 mg total) by mouth daily. 90 tablet 1   Continuous Glucose Receiver (DEXCOM G7 RECEIVER) DEVI USE TO MONITOR GLUCOSE 1 each 0   Continuous Glucose Sensor (DEXCOM G7 SENSOR) MISC 1 Device by Does not apply route as directed. 9 each 3   dapagliflozin propanediol (FARXIGA) 10 MG TABS tablet Take 1 tablet (10 mg total) by mouth daily. 90 tablet 3   ezetimibe (ZETIA) 10 MG tablet Take 1 tablet (10 mg total) by mouth daily. 90 tablet 3   furosemide (LASIX) 20 MG tablet Take 1 tablet by mouth daily (only as needed) 90 tablet 3   hydrALAZINE (APRESOLINE) 10 MG tablet Take 2 tablets (20 mg total) by mouth 3 (three) times daily. 240 tablet 3   insulin glargine, 1 Unit Dial, (TOUJEO SOLOSTAR) 300 UNIT/ML Solostar Pen Inject 14 Units into the skin daily in the afternoon. 15 mL 3   insulin lispro (HUMALOG KWIKPEN) 100 UNIT/ML KwikPen Max daily 30 units 30  mL 3   Insulin Pen Needle 32G X 4 MM MISC 1 Device by Does not apply route in the morning, at noon, in the evening, and at bedtime. 400 each 3   latanoprost (XALATAN) 0.005 % ophthalmic solution Place 1 drop into both eyes at bedtime.     liraglutide (VICTOZA) 18 MG/3ML SOPN Inject 1.2 mg into the skin daily. 9 mL 1   losartan (COZAAR) 50 MG tablet TAKE 1 TABLET(50 MG) BY MOUTH DAILY 90 tablet 1   Magnesium 250 MG TABS Take 250 mg by mouth daily.     nitroGLYCERIN (NITROSTAT) 0.4 MG SL tablet Place  1 tablet (0.4 mg total) under the tongue every 5 (five) minutes as needed for chest pain. one tab every 5 minutes up to 3 tablets total over 15 minutes. 25 tablet 3   ondansetron (ZOFRAN) 4 MG tablet Take 1 tablet (4 mg total) by mouth 2 (two) times daily as needed for nausea or vomiting. 60 tablet 1   rosuvastatin (CRESTOR) 20 MG tablet Take 1 tablet (20 mg total) by mouth daily. 90 tablet 3   sertraline (ZOLOFT) 50 MG tablet TAKE 1 TABLET(50 MG) BY MOUTH DAILY 90 tablet 1   topiramate (TOPAMAX) 25 MG tablet Take 1 tablet (25 mg total) by mouth at bedtime. 30 tablet 2   No current facility-administered medications on file prior to visit.    Past Medical History:  Diagnosis Date   Bilateral carpal tunnel syndrome 03/27/2019   Boils    Glaucoma    Heart murmur    HTN (hypertension)    Hx of adenomatous colonic polyps    Hyperlipidemia    Osteoporosis    Pneumonia    Stroke (HCC)    Type II or unspecified type diabetes mellitus without mention of complication, uncontrolled    No Known Allergies  Social History   Socioeconomic History   Marital status: Single    Spouse name: Not on file   Number of children: Not on file   Years of education: Not on file   Highest education level: Not on file  Occupational History   Not on file  Tobacco Use   Smoking status: Former    Types: Cigarettes    Quit date: 10/15/2016    Years since quitting: 5.7   Smokeless tobacco: Never   Tobacco comments:    smokes occ.   Vaping Use   Vaping Use: Never used  Substance and Sexual Activity   Alcohol use: Yes    Alcohol/week: 0.0 standard drinks of alcohol    Comment: occ   Drug use: No   Sexual activity: Not on file  Other Topics Concern   Not on file  Social History Narrative   Caffiene coffee 1 cup in am.   Retired from [employer].     Social Determinants of Health   Financial Resource Strain: Low Risk  (05/03/2022)   Overall Financial Resource Strain (CARDIA)    Difficulty  of Paying Living Expenses: Not hard at all  Food Insecurity: No Food Insecurity (05/03/2022)   Hunger Vital Sign    Worried About Running Out of Food in the Last Year: Never true    Ran Out of Food in the Last Year: Never true  Transportation Needs: No Transportation Needs (05/03/2022)   PRAPARE - Administrator, Civil Service (Medical): No    Lack of Transportation (Non-Medical): No  Recent Concern: Transportation Needs - Unmet Transportation Needs (03/08/2022)   PRAPARE -  Administrator, Civil Service (Medical): Yes    Lack of Transportation (Non-Medical): Yes  Physical Activity: Insufficiently Active (05/03/2022)   Exercise Vital Sign    Days of Exercise per Week: 2 days    Minutes of Exercise per Session: 10 min  Stress: No Stress Concern Present (05/03/2022)   [employer] of Occupational Health - Occupational Stress Questionnaire    Feeling of Stress : Not at all  Social Connections: Moderately Integrated (05/03/2022)   Social Connection and Isolation Panel [NHANES]    Frequency of Communication with Friends and Family: More than three times a week    Frequency of Social Gatherings with Friends and Family: More than three times a week    Attends Religious Services: More than 4 times per year    Active Member of [employer] or Organizations: Yes    Attends Banker Meetings: More than 4 times per year    Marital Status: Widowed    Vitals:   07/02/22 1524  BP: 120/60  Pulse: 85  SpO2: 97%   Body mass index is 22.46 kg/m.  Physical Exam Vitals and nursing note reviewed.  Constitutional:      General: She is not in acute distress.    Appearance: She is well-developed.  HENT:     Head: Normocephalic and atraumatic.     Mouth/Throat:     Dentition: Abnormal dentition.  Eyes:     Conjunctiva/sclera: Conjunctivae normal.  Cardiovascular:     Rate and Rhythm: Normal rate and regular rhythm.     Heart sounds: Murmur (SEM I/VI LUSB) heard.   Pulmonary:     Effort: Pulmonary effort is normal. No respiratory distress.     Breath sounds: Normal breath sounds.  Abdominal:     Palpations: Abdomen is soft.     Tenderness: There is no abdominal tenderness.  Musculoskeletal:     Right lower leg: 1+ Pitting Edema present.     Left lower leg: 1+ Pitting Edema present.  Skin:    General: Skin is warm.     Findings: Lesion present. No erythema or rash.     Comments: Scattered hyperpigmented raised lesions under breasts and on upper abdomen. Biggest one on lower right breast lesion, 3-4 cm, not tender, defined borders.  Neurological:     General: No focal deficit present.     Mental Status: She is alert and oriented to person, place, and time.     Comments: Unstable gait assisted by a walker.  Psychiatric:        Mood and Affect: Mood and affect normal.   ASSESSMENT AND PLAN:  Ms.Beanca was seen today for follow-up. Cerebrovascular accident (CVA) due to embolism of cerebral artery [facility]) Assessment & Plan: Multiple CVA's. Currently on Plavix 75 mg daily, Zetia 10 mg daily, and rosuvastatin 20 mg daily. Cardiac loop changed for a new one yesterday, so far no serious arrhythmia has been identified. Residual unstable gait and slurred speech. HH referral placed to continue PT at home. Fall precautions discussed.  Orders: -     Ambulatory referral to Home Health  Hypertension with heart disease Assessment & Plan: BP adequately controlled. Continue Amlodipine 5 mg daily, hydroxyzine 10 mg 2 tablets 3 times daily, and losartan 50 mg daily. Continue low-salt diet.   Urge urinary incontinence Assessment & Plan: We discussed possible etiologies, problem has been going on for some time. Furosemide can certainly aggravate problem as well as hyperglycemia. ?  Overactive bladder.  For now  I prefer not offered pharmacologic treatment due to the risk of side effects. Monitor for new symptoms.   Anxiety disorder, unspecified  type Assessment & Plan: Problem is well controlled. Continue Sertraline 50 mg daily. We discussed risks of interaction with Topamax.   Seborrheic keratosis Assessment & Plan: Lesions of concern on breast and upper abdomen are most likely benign, seborrheic keratosis. Dermatology referral was already placed, explained that it may take a few weeks to get appointment information. Monitor for new symptoms.   Return if symptoms worsen or fail to improve, for keep next appointment.  Oyinkansola Truax G. [place], MD  [facility]. [place] office.

## 2022-07-02 ENCOUNTER — Encounter: Payer: Self-pay | Admitting: Family Medicine

## 2022-07-02 ENCOUNTER — Ambulatory Visit: Payer: Medicare Other | Admitting: Family Medicine

## 2022-07-02 VITALS — BP 120/60 | HR 85 | Ht 60.0 in | Wt 115.0 lb

## 2022-07-02 DIAGNOSIS — Z8673 Personal history of transient ischemic attack (TIA), and cerebral infarction without residual deficits: Secondary | ICD-10-CM

## 2022-07-02 DIAGNOSIS — I119 Hypertensive heart disease without heart failure: Secondary | ICD-10-CM | POA: Diagnosis not present

## 2022-07-02 DIAGNOSIS — I6340 Cerebral infarction due to embolism of unspecified cerebral artery: Secondary | ICD-10-CM

## 2022-07-02 DIAGNOSIS — F419 Anxiety disorder, unspecified: Secondary | ICD-10-CM

## 2022-07-02 DIAGNOSIS — L821 Other seborrheic keratosis: Secondary | ICD-10-CM

## 2022-07-02 DIAGNOSIS — N3941 Urge incontinence: Secondary | ICD-10-CM

## 2022-07-02 NOTE — Patient Instructions (Addendum)
A few things to remember from today's visit:  Cerebrovascular accident (CVA) due to embolism of cerebral artery (HCC) - Plan: Ambulatory referral to Home Health  Hypertension with heart disease No changes today. If you need refills for medications you take chronically, please call your pharmacy. Do not use My Chart to request refills or for acute issues that need immediate attention. If you send a my chart message, it may take a few days to be addressed, specially if I am not in the office.  Please be sure medication list is accurate. If a new problem present, please set up appointment sooner than planned today.

## 2022-07-02 NOTE — Assessment & Plan Note (Signed)
Lesions of concern on breast and upper abdomen are most likely benign, seborrheic keratosis. Dermatology referral was already placed, explained that it may take a few weeks to get appointment information. Monitor for new symptoms.

## 2022-07-02 NOTE — Assessment & Plan Note (Addendum)
Multiple CVA's. Currently on Plavix 75 mg daily, Zetia 10 mg daily, and rosuvastatin 20 mg daily. Cardiac loop changed for a new one yesterday, so far no serious arrhythmia has been identified. Residual unstable gait and slurred speech. HH referral placed to continue PT at home. Fall precautions discussed.

## 2022-07-02 NOTE — Assessment & Plan Note (Addendum)
We discussed possible etiologies, problem has been going on for some time. Furosemide can certainly aggravate problem as well as hyperglycemia. ?  Overactive bladder.  For now I prefer not offered pharmacologic treatment due to the risk of side effects. Monitor for new symptoms.

## 2022-07-02 NOTE — Assessment & Plan Note (Signed)
BP adequately controlled. Continue Amlodipine 5 mg daily, hydroxyzine 10 mg 2 tablets 3 times daily, and losartan 50 mg daily. Continue low-salt diet.

## 2022-07-02 NOTE — Assessment & Plan Note (Addendum)
Problem is well controlled. Continue Sertraline 50 mg daily. We discussed risks of interaction with Topamax.

## 2022-07-02 NOTE — Progress Notes (Signed)
Patient has appt today, will discuss then

## 2022-07-08 ENCOUNTER — Telehealth: Payer: Self-pay | Admitting: Family Medicine

## 2022-07-08 NOTE — Telephone Encounter (Signed)
Physical therapy 1x3/2x3/1x2 effective today

## 2022-07-09 NOTE — Telephone Encounter (Signed)
VO given.

## 2022-07-13 NOTE — Telephone Encounter (Signed)
Patient called and cancelled less than 24 hours in advance of her SS appt. She was informed to pay the $250 cancellation fee. She stated she would pay half and she was transferred to billing.  She is r/s for 10/03/22 at 8 pm.  Mailed new packet to the patient.

## 2022-07-26 ENCOUNTER — Other Ambulatory Visit: Payer: Medicare Other

## 2022-07-26 NOTE — Progress Notes (Signed)
   07/26/2022  Patient ID: Sarah [name], female   DOB: [birth year], 77 y.o.   MRN: [id number]  S/O Telephone visit with patient and sister, [name], to follow-up on medication management  Medication Management -Patient reports return of frequent headaches and states topiramate was previously used, but she is unsure if she is taking this still  DM -Patient using Dexom for CGM and reports BG's averaging in the 200's with no s/sx of hypoglycemia -She did recently report increased urination to Dr. [place]  A/P  Medication Management -Patient endorses taking a small white tablet at bedtime that could be topiramate.  Sister will be in town this week and will reach out if patient does not have this medication -[name] sees neurology next month and can discuss headache treatment if still occurring at this time  DM -Patient is followed by Dr. Lonzo Cloud with endocrinology and sees her again in October- deferring diabetes management to her -Marcelline Deist could be causing increased urination and urgency  Follow-up:  None scheduled at this time but can as needed per PCP  Lenna Gilford, PharmD, DPLA

## 2022-07-29 ENCOUNTER — Telehealth: Payer: Self-pay | Admitting: Family Medicine

## 2022-07-29 NOTE — Telephone Encounter (Signed)
Sarah [name] - PT with [facility]  [phone] *Ok to leave a detailed message on this line  Saw Pt yesterday (07/28/22) for PT Pt informed PT that she had a fall last Sunday - 07/25/22 No injuries  No new complaints

## 2022-07-30 NOTE — Telephone Encounter (Signed)
fyi

## 2022-08-03 ENCOUNTER — Telehealth: Payer: Self-pay | Admitting: Adult Health

## 2022-08-03 ENCOUNTER — Other Ambulatory Visit: Payer: Self-pay | Admitting: Anesthesiology

## 2022-08-03 MED ORDER — TOPIRAMATE 25 MG PO TABS: 25.0000 mg | ORAL_TABLET | Freq: Every evening | ORAL | 2 refills | Status: DC

## 2022-08-03 NOTE — Telephone Encounter (Signed)
Pt sister requesting a refill on topiramate (TOPAMAX) 25 MG tablet. Refill should be set to [facility] [phone]

## 2022-08-03 NOTE — Telephone Encounter (Signed)
Called patient and informed them that the Rx was refilled and sent to pharmacy.

## 2022-08-04 ENCOUNTER — Ambulatory Visit (INDEPENDENT_AMBULATORY_CARE_PROVIDER_SITE_OTHER): Payer: Medicare Other

## 2022-08-04 DIAGNOSIS — I6340 Cerebral infarction due to embolism of unspecified cerebral artery: Secondary | ICD-10-CM | POA: Diagnosis not present

## 2022-08-05 LAB — CUP PACEART REMOTE DEVICE CHECK: Date Time Interrogation Session: 20240724163917

## 2022-08-17 NOTE — Progress Notes (Signed)
[facility] Summary Report / Loop Recorder 

## 2022-08-23 ENCOUNTER — Other Ambulatory Visit: Payer: Self-pay | Admitting: Internal Medicine

## 2022-08-23 ENCOUNTER — Other Ambulatory Visit: Payer: Self-pay | Admitting: Family Medicine

## 2022-08-23 DIAGNOSIS — I119 Hypertensive heart disease without heart failure: Secondary | ICD-10-CM

## 2022-08-24 ENCOUNTER — Other Ambulatory Visit: Payer: Self-pay

## 2022-08-24 DIAGNOSIS — E782 Mixed hyperlipidemia: Secondary | ICD-10-CM

## 2022-08-24 MED ORDER — EZETIMIBE 10 MG PO TABS
10.0000 mg | ORAL_TABLET | Freq: Every day | ORAL | 3 refills | Status: DC
Start: 2022-08-24 — End: 2023-08-31

## 2022-09-06 ENCOUNTER — Ambulatory Visit: Payer: Medicare Other

## 2022-09-06 DIAGNOSIS — I6340 Cerebral infarction due to embolism of unspecified cerebral artery: Secondary | ICD-10-CM | POA: Diagnosis not present

## 2022-09-07 ENCOUNTER — Ambulatory Visit (INDEPENDENT_AMBULATORY_CARE_PROVIDER_SITE_OTHER): Payer: Medicare Other | Admitting: Adult Health

## 2022-09-07 ENCOUNTER — Encounter: Payer: Self-pay | Admitting: Adult Health

## 2022-09-07 VITALS — BP 159/72 | HR 79 | Ht 60.0 in | Wt 115.0 lb

## 2022-09-07 DIAGNOSIS — Z8673 Personal history of transient ischemic attack (TIA), and cerebral infarction without residual deficits: Secondary | ICD-10-CM

## 2022-09-07 DIAGNOSIS — G44221 Chronic tension-type headache, intractable: Secondary | ICD-10-CM

## 2022-09-07 LAB — CUP PACEART REMOTE DEVICE CHECK
Date Time Interrogation Session: 20240826163917
Implantable Pulse Generator Implant Date: 20240618

## 2022-09-07 MED ORDER — TOPIRAMATE 25 MG PO TABS: 25.0000 mg | ORAL_TABLET | Freq: Every evening | ORAL | 3 refills | Status: DC

## 2022-09-07 NOTE — Patient Instructions (Signed)
Your Plan:  Continue topamax 25mg  nightly for headaches  Continue to use cane at all times, continue exercises as recommended during therapy sessions  Can consider looking at local gyms for water classes which can be helpful for balance - could consider looking into [facility] (located at [employer])  Continue stroke prevention medications and routine follow up with your PCP and endocrinologist for stroke risk factor management  Proceed with sleep study as scheduled next month to evaluate for sleep apnea     Follow up in 6 months or call earlier if needed        Thank you for coming to see [place] at [facility]. I hope we have been able to provide you high quality care today.  You may receive a patient satisfaction survey over the next few weeks. We would appreciate your feedback and comments so that we may continue to improve ourselves and the health of our patients.

## 2022-09-07 NOTE — Progress Notes (Signed)
[facility]  PATIENT: Sarah [name] DOB: Apr 25, 1945    REASON FOR VISIT: Follow-up for stroke right MCA 10/2016 readmitted 02/08/2018 for right parieto-occipital stroke  HISTORY FROM: Patient, sister and chart  Chief complaint: Chief Complaint  Patient presents with   Follow-up    Patient in room #8 and alone. Patient states she still not steady and PT is over but she doing okay.      HPI:  Update 09/07/2022 JM: Returns for follow-up visit unaccompanied.  Residual gait and balance impairment and at times unsteadiness, some improvement since prior visit. Completed PT last week, noted some benefit. Continues to do HEP.  Ambulates with cane, denies any recent falls.  Denies new stroke/TIA symptoms.  At prior visit, complained of daily headaches and initiated topiramate 25mg  nightly, has not had any recent headaches, tolerating topamax well.  Also evaluated by Dr. [name] for possible underlying sleep apnea, sleep study scheduled 9/22.   Reports compliance on Plavix, atorvastatin and Zetia.  Routinely follows with PCP and endocrinology for stroke risk factor management.  Continues to have decreased vision in left eye, routinely followed by ophthalmology, was told she had blood behind her eye, plans to f/u towards the end of this year.      History provided for reference purposes only Update 03/09/2022 JM: Overall stable from stroke standpoint without any new or worsening stroke/TIA symptoms.  Reports residual gait difficulty and imbalance, has improved some since prior visit, has graduated from walker to cane. Still feels unsteady at times when ambulating. Working with therapy at home. Denies any recent falls.   Continues to experience headaches, have been every other day if not daily. Can occur mid afternoon typically, located frontal and behind eyes. Questions if due to her vision, can feel eye fatigue/strain after reading for prolonged period of time or  watching television, has not noticed worsening of headaches around this time. Has f/u with eye doctor in April. Denies taking OTC medications to help with headaches.  She also continues to complain of daytime fatigue.  Compliant on Plavix, atorvastatin and Zetia - currently prescribed 2x 40mg  atorvastatin tablets (due to difficulty swallowing 80mg  tablet) but believes only take 40mg  per day Blood pressure typically well controlled, slightly on higher side today, monitored by [facility] therapy and has been stable Routinely follows with PCP and endocrinology Cardiac monitor negative for atrial fibrillation  Update 11/04/2021 JM: Patient returns for recent hospitalization.  She is accompanied by her sister. She presented to ED on 10/13 with acute encephalopathy with memory lapses and feeling unsteady/wobbly with walking and found to have punctate left cerebellar and left parietal lobe infarctions likely incidental finding on MRI during encephalopathy work-up, etiology unclear possibly cardioembolic pattern.  MRA negative LVO, bilateral M1 segment stenosis.  Carotid Doppler unremarkable.  EEG normal.  EF 60 to 65%.  Recommend 30-day event monitor to evaluate for A-fib, previously had loop recorder without evidence of A-fib.  LDL 227.  A1c 12.6.  Recommended DAPT for 3 weeks then Plavix alone and as well as continuation of home dose Zetia and atorvastatin and recommended follow-up outpatient with lipid clinic for further lipid management.  Also advised outpatient PCP follow-up for better DM control.  She was evaluated by therapies, initially recommended CIR but due to improvement during hospitalization, recommendations changed to outpatient therapy and 24/7 care/supervision.   Reports since discharge, she has been experiencing generalized weakness sensation with increased fatigue and awakening about 3 times per week with left-sided headaches, these  usually get better throughout the day, will use Tylenol if needed with  benefit (denies overuse).  She also reports some visual changes in her left eye, has appt with ophthalmologist Dr. Clelia Croft on Monday.  She denies any specific weakness or speech difficulty.  Does have some gait impairment and currently ambulating with RW, denies any recent falls.  She is living at home but family has been providing 24/7 care/assistance.  She is scheduled to start outpatient therapies tomorrow.  Denies any new stroke/TIA symptoms.  She remains on aspirin and Plavix as well as atorvastatin and Zetia, denies side effects -family has been assisting with ensuring medication compliance Blood pressure today 141/72 She has since been seen by PCP and endocrinology Has follow-up visit with cardiology 11/28 - reports she has not heard anything in regards to the cardiac monitor   Update 09/11/2020 JM:, returns for requested visit due to c/o worsening balance over the past 2 months and gradual memory decline. Worsening knee pain after a near fall in June - seen by ortho for left knee arthritis and bursitis. Limiting ambulation due to pain.  She is able to ambulate without assistive device.  Mild decline of memory reported but able to maintain ADLs and IADLs independently.  Sedentary lifestyle without any type of memory or cognitive exercises.  Denies any specific weakness, numbness/tingling, speech difficulty or confusion.  Currently on aspirin, plavix, zetia and atorvastatin for secondary stroke prevention and hx of CAD. Blood pressure today 143/72.  Glucose levels routinely monitored which has been stable.  No further concerns at this time.  Update 06/21/2019:, Sarah [name] is being seen for stroke follow-up unaccompanied.  She has been stable from a stroke standpoint without new or worsening stroke/TIA symptoms.  Prior complaints of worsening imbalance, left arm numbness/tingling and speech difficulty with MRI and MRA stable without acute abnormalities.  She reports ongoing improvement and continues to  do HEP as recommended by therapy at home.  EMG/NCV completed for left arm numbness/tingling with mild bilateral carpal tunnel syndrome but otherwise unremarkable.  Reports left arm symptoms resolved as well as left-sided neck symptoms.  Continues on clopidogrel and atorvastatin for secondary stroke prevention.  Recently added Zetia by PCP due to recent lipid panel showing LDL 115.  Blood pressure today 146/72.  Recent A1c 12.3 with recent adjustments of insulin regimen and continuation of Metformin and Victoza by PCP.  Continues to follow routinely with PCP for HTN, HLD and DM management.  Loop recorder has not shown atrial fibrillation thus far.  No concerns at this time.  Update 02/13/2019: Sarah [name] is a 77 year old female who is being seen today for stroke follow-up.  She has been stable from a stroke standpoint with residual mild stable left homonymous hemianopsia, mild imbalance which she believes has been slightly worsening over the past couple of months along with possible worsening of prior aphasia from stroke in 2018.  She does admit to limited physical activity and does not have an established exercise regimen along with endorsing increased stress currently caring for her mother with increased illness and increased stress due to overall COVID-19 pandemic.  She has recently signed up for [employer] program which will be starting in March.  She also states for the past 2 weeks, she has been experiencing left arm numbness/tingling but will resolve quickly and symptoms intermittent.  She does endorse left-sided neck stiffness that radiates into her upper trapezius and increased pain when looking towards the left.  Denies any increased weakness.  Unable to determine if numbness/tingling symptoms occur in certain positions.  She has not previously experienced this sensation.  Denies any other new or worsening neurological symptoms.  She did have bilateral cataract surgery in 07/2018 without  complication. Continues on Plavix and atorvastatin for secondary stroke prevention without side effects.  Blood pressure today 130/60.  Continues to follow with PCP for DM management with recent A1c 9.7.  Loop recorder is not shown atrial fibrillation thus far.  Monitoring/battery life will be completed 10/2019.  She has received her first dose of Covid vaccination and plans on receiving second dose next week.  Denies new or worsening stroke/TIA symptoms.  No further concerns at this time.  06/27/2018 VIRTUAL VISIT: Sarah [name] is being seen today for three-month follow-up visit regarding recent stroke.  Initially scheduled for office visit follow-up but due to COVID-19 safety precautions, visit transition to telemedicine via MyChart with patient's consent.  She has been stable from a stroke standpoint with residual left homonymous hemianopsia and balance difficulties.  She continues to participate in outpatient PT with ongoing improvement.  Continues on Plavix and atorvastatin for secondary stroke prevention without reported side effects. Blood pressure monitored at home.  Lab work obtained yesterday with A1c 10.3 (prior 13.5).  She continues to follow with PCP for ongoing management.  Loop recorder has not shown atrial fibrillation thus far.  No further concerns at this time.  Denies new or worsening stroke/TIA symptoms.   Hospital follow-up 03/23/2018 CM: Sarah [name], 77 year old female returns for follow-up with readmission for stroke 1/29/ 2020.  MRI of the brain acute cortical and subcortical RPO infarcts.  She initially presented with left hemianopsia.  She is now on Plavix and aspirin for 3 months and then Plavix alone.  She has not had further stroke or TIA symptoms.  She has minimal bruising and no bleeding.  Hemoglobin A1c 13.5 on hospital admission.  She claims she is trying to get that down to a more reasonable level.  She remains on Lipitor without myalgias.  Blood pressure in the office today  146/87.  She continues to go to [facility].  She is to start rehab physical therapy and occupational therapy today.  Loop recorder so far no atrial fibrillation.  She is not driving due to her vision.  She returns for reevaluation  Hospital summary: right parieto-occipital infarcts in setting of progressive R MCA stenosis, infarcts secondary to large vessel disease source Resultant L hemianopia  CT head R parietal watershed infarct. Small vessel disease.  MRI  Acute cortical and subcortical R P-O infarcts. Old R CR and R frontal lobe infarcts. Small vessel disease.  MRA head  No LVO. Focal stenosis R M1, R M2/M3, L M1 MRA neck B VA origin atherosclerosis 30-50% 2D Echo  pending  Loop interrogation - last check 1/3 w/o AF - interrogation today without AF LDL 76 HgbA1c 13.5 Lovenox 40 mg sq daily for VTE prophylaxis clopidogrel 75 mg daily prior to admission, now on aspirin 325 mg daily and clopidogrel 75 mg daily. Continue DAPT x 3 months then plavix alone Therapy recommendations:  OP OT and PT Disposition:  Home Patient advised not to drive      REVIEW OF SYSTEMS: Full 14 system review of systems performed and notable only for those listed, all others are neg:  See HPI  ALLERGIES: No Known Allergies  HOME MEDICATIONS:   PAST MEDICAL HISTORY: Past Medical History:  Diagnosis Date   Bilateral carpal tunnel syndrome 03/27/2019  Boils    Glaucoma    Heart murmur    HTN (hypertension)    Hx of adenomatous colonic polyps    Hyperlipidemia    Osteoporosis    Pneumonia    Stroke (HCC)    Type II or unspecified type diabetes mellitus without mention of complication, uncontrolled     PAST SURGICAL HISTORY: Past Surgical History:  Procedure Laterality Date   ABDOMINAL HYSTERECTOMY     CARPAL TUNNEL RELEASE     right   COLONOSCOPY  12-10-10   per Dr. Arlyce Dice, clear, repeat in 7 yrs    CORONARY STENT INTERVENTION N/A 03/11/2020   Procedure: CORONARY STENT INTERVENTION;   Surgeon: Corky Crafts, MD;  Location: [facility] INVASIVE CV LAB;  Service: Cardiovascular;  Laterality: N/A;   CORONARY ULTRASOUND/IVUS N/A 03/11/2020   Procedure: Intravascular Ultrasound/IVUS;  Surgeon: Corky Crafts, MD;  Location: [facility] INVASIVE CV LAB;  Service: Cardiovascular;  Laterality: N/A;   INCISION AND DRAINAGE PERIRECTAL ABSCESS N/A 09/26/2015   Procedure: IRRIGATION AND DEBRIDEMENT PERIRECTAL ABSCESS;  Surgeon: Rodman Pickle, MD;  Location[facility] OR;  Service: General;  Laterality: N/A;   KNEE ARTHROSCOPY     right   LEFT HEART CATH AND CORONARY ANGIOGRAPHY N/A 03/11/2020   Procedure: LEFT HEART CATH AND CORONARY ANGIOGRAPHY;  Surgeon: Corky Crafts, MD;  Location: [facility] INVASIVE CV LAB;  Service: Cardiovascular;  Laterality: N/A;   LOOP RECORDER INSERTION N/A 10/19/2016   Procedure: LOOP RECORDER INSERTION;  Surgeon: Hillis Range, MD;  Location: [facility] INVASIVE CV LAB;  Service: Cardiovascular;  Laterality: N/A;   ORIF ANKLE FRACTURE Right 03/24/2012   Procedure: OPEN REDUCTION INTERNAL FIXATION (ORIF) ANKLE FRACTURE;  Surgeon: Nadara Mustard, MD;  Location: [facility] OR;  Service: Orthopedics;  Laterality: Right;  Open Reduction Internal Fixation Right Bimalleolar ankle fracture   POLYPECTOMY     TEE WITHOUT CARDIOVERSION N/A 10/18/2016   Procedure: TRANSESOPHAGEAL ECHOCARDIOGRAM (TEE);  Surgeon: Pricilla Riffle, MD;  Location: [facility] ENDOSCOPY;  Service: Cardiovascular;  Laterality: N/A;   TONSILLECTOMY      FAMILY HISTORY: Family History  Problem Relation Age of Onset   Diabetes Mother    Hypertension Mother    Stroke Father    Stroke Maternal Uncle    Stroke Paternal Uncle    Colon cancer Neg Hx    Esophageal cancer Neg Hx    Rectal cancer Neg Hx    Stomach cancer Neg Hx    Inflammatory bowel disease Neg Hx    Liver disease Neg Hx    Pancreatic cancer Neg Hx     SOCIAL HISTORY: Social History   Socioeconomic History   Marital status: Single    Spouse name: Not on file    Number of children: Not on file   Years of education: Not on file   Highest education level: Not on file  Occupational History   Not on file  Tobacco Use   Smoking status: Former    Current packs/day: 0.00    Types: Cigarettes    Quit date: 10/15/2016    Years since quitting: 5.8   Smokeless tobacco: Never   Tobacco comments:    smokes occ.   Vaping Use   Vaping status: Never Used  Substance and Sexual Activity   Alcohol use: Yes    Alcohol/week: 0.0 standard drinks of alcohol    Comment: occ   Drug use: No   Sexual activity: Not on file  Other Topics Concern   Not on file  Social History Narrative   Caffiene coffee 1 cup in am.   Retired from [employer].     Social Determinants of Health   Financial Resource Strain: Low Risk  (05/03/2022)   Overall Financial Resource Strain (CARDIA)    Difficulty of Paying Living Expenses: Not hard at all  Food Insecurity: No Food Insecurity (05/03/2022)   Hunger Vital Sign    Worried About Running Out of Food in the Last Year: Never true    Ran Out of Food in the Last Year: Never true  Transportation Needs: No Transportation Needs (05/03/2022)   PRAPARE - Administrator, Civil Service (Medical): No    Lack of Transportation (Non-Medical): No  Recent Concern: Transportation Needs - Unmet Transportation Needs (03/08/2022)   PRAPARE - Transportation    Lack of Transportation (Medical): Yes    Lack of Transportation (Non-Medical): Yes  Physical Activity: Insufficiently Active (05/03/2022)   Exercise Vital Sign    Days of Exercise per Week: 2 days    Minutes of Exercise per Session: 10 min  Stress: No Stress Concern Present (05/03/2022)   [employer] of Occupational Health - Occupational Stress Questionnaire    Feeling of Stress : Not at all  Social Connections: Moderately Integrated (05/03/2022)   Social Connection and Isolation Panel [NHANES]    Frequency of Communication with Friends and Family: More than  three times a week    Frequency of Social Gatherings with Friends and Family: More than three times a week    Attends Religious Services: More than 4 times per year    Active Member of [employer] or Organizations: Yes    Attends Banker Meetings: More than 4 times per year    Marital Status: Widowed  Intimate Partner Violence: Not At Risk (05/03/2022)   Humiliation, Afraid, Rape, and Kick questionnaire    Fear of Current or Ex-Partner: No    Emotionally Abused: No    Physically Abused: No    Sexually Abused: No     PHYSICAL EXAM  Today's Vitals   09/07/22 1348  BP: (!) 159/72  Pulse: 79  Weight: 115 lb (52.2 kg)  Height: 5' (1.524 m)   Body mass index is 22.46 kg/m.   General: well developed, well nourished, very pleasant elderly African-American female, seated, in no evident distress Head: head normocephalic and atraumatic.   Neck: supple with no carotid or supraclavicular bruits Cardiovascular: regular rate and rhythm, no murmurs Musculoskeletal: no deformity Skin:  no rash/petichiae Vascular:  Normal pulses all extremities   Neurologic Exam Mental Status: Awake and fully alert. Occasional stuttering of speech/speech hesitancy (chronic). Oriented to place and time. Recent memory subjectively impaired and remote memory intact. Attention span, concentration and fund of knowledge appropriate during visit. Mood and affect appropriate.  Cranial Nerves: Pupils equal, briskly reactive to light. Extraocular movements full without nystagmus. Visual fields full to confrontation testing. Hearing intact. Facial sensation intact. Face, tongue, palate moves normally and symmetrically.  Motor: Normal bulk and tone. Normal strength in all tested extremity muscles. Sensory.: intact to touch , pinprick , position and vibratory sensation.  Coordination: Rapid alternating movements normal in all extremities except slightly decreased left hand dexterity (chronic from prior stroke).  Finger-to-nose and heel-to-shin performed accurately bilaterally. Gait and Station: Arises from chair without difficulty. Stance is normal. Gait demonstrates decreased stride length and step height L>R with mild imbalance and use of cane.  Tandem walk and heel toe not attempted. Reflexes: 1+ and symmetric. Toes  downgoing.      DIAGNOSTIC DATA (LABS, IMAGING, TESTING) - I reviewed patient records, labs, notes, testing and imaging myself where available.  Lab Results  Component Value Date   WBC 10.2 11/19/2021   HGB 11.7 (L) 11/19/2021   HCT 36.8 11/19/2021   MCV 97.4 11/19/2021   PLT 376 11/19/2021      Component Value Date/Time   NA 144 12/08/2021 1351   K 4.1 12/08/2021 1351   CL 106 12/08/2021 1351   CO2 24 12/08/2021 1351   GLUCOSE 115 (H) 12/08/2021 1351   GLUCOSE 101 (H) 11/19/2021 1436   BUN 15 12/08/2021 1351   CREATININE 0.85 12/08/2021 1351   CREATININE 0.55 (L) 10/24/2019 1602   CALCIUM 9.8 12/08/2021 1351   PROT 6.2 09/22/2020 0958   ALBUMIN 1.6 (L) 10/26/2021 0445   ALBUMIN 3.5 (L) 09/22/2020 0958   AST 20 09/22/2020 0958   ALT 17 09/22/2020 0958   ALKPHOS 99 09/22/2020 0958   BILITOT <0.2 09/22/2020 0958   GFRNONAA 41 (L) 11/19/2021 1436   GFRNONAA 94 10/24/2019 1602   GFRAA 103 03/04/2020 1025   GFRAA 109 10/24/2019 1602   Lab Results  Component Value Date   CHOL 166 03/09/2022   HDL 90 03/09/2022   LDLCALC 63 03/09/2022   TRIG 70 03/09/2022   CHOLHDL 1.8 03/09/2022   Lab Results  Component Value Date   HGBA1C 8.5 (A) 05/10/2022        ASSESSMENT AND PLAN    Sarah [name] is a 77 y.o. female with history of recent punctate left cerebellar and left parietal lobe infarcts in 10/2021 likely incidental finding on MRI during encephalopathy work-up of unclear etiology, hx of right parieto-occipital infarcts in setting of progressive right MCA stenosis in 2020 likely secondary to large vessel disease source and hx of right MCA stroke in  2018 s/p ILR without evidence of A-fib during 4 year monitoring duration.     Ischemic strokes -advised use of cane at all times for chronic gait impairment with imbalance and unsteadiness, continue HEP as advised by therapies  -continue plavix, lipitor and Zetia for secondary stroke prevention.  -Cardiac monitor negative for atrial fibrillation -Continue close PCP, endocrinology and cardiology follow-up for aggressive stroke risk factor management maintain blood pressure goal <130/80, diabetes with hemoglobin A1c goal below 7.0% and lipids with LDL cholesterol goal below 70 mg/dL.  -LDL 63 (06/2022) -A1c 8.5 (06/1605)    Headaches (since 10/2021) Excessive daytime fatigue At risk for sleep apnea -Improvement of headaches, continue topiramate 25 mg nightly -refill provided -eval by Dr. [name] 03/2022 - Sleep study scheduled 10/03/2022     Follow-up in 6 months or call earlier if needed     I spent 25 minutes of face-to-face and non-face-to-face time with patient.  This included previsit chart review, lab review, study review, order entry, electronic health record documentation, patient education and discussion regarding above diagnoses and treatment plan and answered all other questions to patient's satisfaction   Ihor Austin, [facility]  [facility] [address] [place], [place] [address]  Phone [phone] Fax [phone] Note: This document was prepared with digital dictation and possible smart phrase technology. Any transcriptional errors that result from this process are unintentional.

## 2022-09-15 NOTE — Progress Notes (Signed)
[facility] Summary Report / Loop Recorder 

## 2022-10-03 ENCOUNTER — Ambulatory Visit (INDEPENDENT_AMBULATORY_CARE_PROVIDER_SITE_OTHER): Payer: Medicare Other | Admitting: Neurology

## 2022-10-03 DIAGNOSIS — R0683 Snoring: Secondary | ICD-10-CM

## 2022-10-03 DIAGNOSIS — Z8673 Personal history of transient ischemic attack (TIA), and cerebral infarction without residual deficits: Secondary | ICD-10-CM

## 2022-10-03 DIAGNOSIS — G4720 Circadian rhythm sleep disorder, unspecified type: Secondary | ICD-10-CM

## 2022-10-03 DIAGNOSIS — Z820 Family history of epilepsy and other diseases of the nervous system: Secondary | ICD-10-CM

## 2022-10-03 DIAGNOSIS — G4719 Other hypersomnia: Secondary | ICD-10-CM

## 2022-10-03 DIAGNOSIS — Z9189 Other specified personal risk factors, not elsewhere classified: Secondary | ICD-10-CM

## 2022-10-03 DIAGNOSIS — R351 Nocturia: Secondary | ICD-10-CM

## 2022-10-03 DIAGNOSIS — R519 Headache, unspecified: Secondary | ICD-10-CM

## 2022-10-11 ENCOUNTER — Ambulatory Visit (INDEPENDENT_AMBULATORY_CARE_PROVIDER_SITE_OTHER): Payer: Medicare Other

## 2022-10-11 ENCOUNTER — Inpatient Hospital Stay (HOSPITAL_BASED_OUTPATIENT_CLINIC_OR_DEPARTMENT_OTHER)
Admission: EM | Admit: 2022-10-11 | Discharge: 2022-10-16 | DRG: 065 | Disposition: A | Discharge status: Home Health | Payer: Medicare Other | Attending: Family Medicine | Admitting: Family Medicine

## 2022-10-11 ENCOUNTER — Other Ambulatory Visit: Payer: Self-pay

## 2022-10-11 ENCOUNTER — Encounter (HOSPITAL_BASED_OUTPATIENT_CLINIC_OR_DEPARTMENT_OTHER): Payer: Self-pay | Admitting: *Deleted

## 2022-10-11 ENCOUNTER — Emergency Department (HOSPITAL_BASED_OUTPATIENT_CLINIC_OR_DEPARTMENT_OTHER): Payer: Medicare Other

## 2022-10-11 DIAGNOSIS — I6340 Cerebral infarction due to embolism of unspecified cerebral artery: Secondary | ICD-10-CM | POA: Diagnosis not present

## 2022-10-11 DIAGNOSIS — R471 Dysarthria and anarthria: Secondary | ICD-10-CM | POA: Diagnosis present

## 2022-10-11 DIAGNOSIS — E785 Hyperlipidemia, unspecified: Secondary | ICD-10-CM | POA: Diagnosis not present

## 2022-10-11 DIAGNOSIS — R29702 NIHSS score 2: Secondary | ICD-10-CM | POA: Diagnosis not present

## 2022-10-11 DIAGNOSIS — I700 Atherosclerosis of aorta: Secondary | ICD-10-CM | POA: Diagnosis not present

## 2022-10-11 DIAGNOSIS — Z8249 Family history of ischemic heart disease and other diseases of the circulatory system: Secondary | ICD-10-CM

## 2022-10-11 DIAGNOSIS — I10 Essential (primary) hypertension: Secondary | ICD-10-CM | POA: Diagnosis present

## 2022-10-11 DIAGNOSIS — Z79899 Other long term (current) drug therapy: Secondary | ICD-10-CM | POA: Diagnosis not present

## 2022-10-11 DIAGNOSIS — Z833 Family history of diabetes mellitus: Secondary | ICD-10-CM

## 2022-10-11 DIAGNOSIS — I639 Cerebral infarction, unspecified: Secondary | ICD-10-CM | POA: Diagnosis present

## 2022-10-11 DIAGNOSIS — R739 Hyperglycemia, unspecified: Secondary | ICD-10-CM

## 2022-10-11 DIAGNOSIS — Z90710 Acquired absence of both cervix and uterus: Secondary | ICD-10-CM

## 2022-10-11 DIAGNOSIS — Z955 Presence of coronary angioplasty implant and graft: Secondary | ICD-10-CM | POA: Diagnosis not present

## 2022-10-11 DIAGNOSIS — Z1152 Encounter for screening for COVID-19: Secondary | ICD-10-CM | POA: Diagnosis not present

## 2022-10-11 DIAGNOSIS — Z87891 Personal history of nicotine dependence: Secondary | ICD-10-CM

## 2022-10-11 DIAGNOSIS — Z880 Allergy status to penicillin: Secondary | ICD-10-CM

## 2022-10-11 DIAGNOSIS — I69354 Hemiplegia and hemiparesis following cerebral infarction affecting left non-dominant side: Secondary | ICD-10-CM

## 2022-10-11 DIAGNOSIS — W1830XA Fall on same level, unspecified, initial encounter: Secondary | ICD-10-CM | POA: Diagnosis present

## 2022-10-11 DIAGNOSIS — R2990 Unspecified symptoms and signs involving the nervous system: Secondary | ICD-10-CM | POA: Diagnosis present

## 2022-10-11 DIAGNOSIS — I63512 Cerebral infarction due to unspecified occlusion or stenosis of left middle cerebral artery: Secondary | ICD-10-CM | POA: Diagnosis present

## 2022-10-11 DIAGNOSIS — N179 Acute kidney failure, unspecified: Secondary | ICD-10-CM | POA: Diagnosis not present

## 2022-10-11 DIAGNOSIS — Z794 Long term (current) use of insulin: Secondary | ICD-10-CM

## 2022-10-11 DIAGNOSIS — R29700 NIHSS score 0: Secondary | ICD-10-CM | POA: Diagnosis not present

## 2022-10-11 DIAGNOSIS — E1165 Type 2 diabetes mellitus with hyperglycemia: Secondary | ICD-10-CM | POA: Diagnosis not present

## 2022-10-11 DIAGNOSIS — E8809 Other disorders of plasma-protein metabolism, not elsewhere classified: Secondary | ICD-10-CM | POA: Diagnosis present

## 2022-10-11 DIAGNOSIS — R531 Weakness: Principal | ICD-10-CM

## 2022-10-11 DIAGNOSIS — Z823 Family history of stroke: Secondary | ICD-10-CM

## 2022-10-11 DIAGNOSIS — G8191 Hemiplegia, unspecified affecting right dominant side: Secondary | ICD-10-CM | POA: Diagnosis present

## 2022-10-11 DIAGNOSIS — Z7902 Long term (current) use of antithrombotics/antiplatelets: Secondary | ICD-10-CM

## 2022-10-11 DIAGNOSIS — M810 Age-related osteoporosis without current pathological fracture: Secondary | ICD-10-CM | POA: Diagnosis present

## 2022-10-11 DIAGNOSIS — I2510 Atherosclerotic heart disease of native coronary artery without angina pectoris: Secondary | ICD-10-CM | POA: Diagnosis not present

## 2022-10-11 DIAGNOSIS — Z860101 Personal history of adenomatous and serrated colon polyps: Secondary | ICD-10-CM

## 2022-10-11 DIAGNOSIS — Z91199 Patient's noncompliance with other medical treatment and regimen due to unspecified reason: Secondary | ICD-10-CM

## 2022-10-11 DIAGNOSIS — R29810 Facial weakness: Secondary | ICD-10-CM | POA: Diagnosis present

## 2022-10-11 LAB — COMPREHENSIVE METABOLIC PANEL
ALT: 56 U/L — ABNORMAL HIGH (ref 0–44)
AST: 33 U/L (ref 15–41)
Albumin: 2.7 g/dL — ABNORMAL LOW (ref 3.5–5.0)
Alkaline Phosphatase: 72 U/L (ref 38–126)
Anion gap: 9 (ref 5–15)
BUN: 15 mg/dL (ref 8–23)
CO2: 23 mmol/L (ref 22–32)
Calcium: 9 mg/dL (ref 8.9–10.3)
Chloride: 106 mmol/L (ref 98–111)
Creatinine, Ser: 1.01 mg/dL — ABNORMAL HIGH (ref 0.44–1.00)
GFR, Estimated: 58 mL/min — ABNORMAL LOW (ref >60)
Glucose, Bld: 459 mg/dL — ABNORMAL HIGH (ref 70–99)
Potassium: 3.5 mmol/L (ref 3.5–5.1)
Sodium: 138 mmol/L (ref 135–145)
Total Bilirubin: 0.3 mg/dL (ref 0.3–1.2)
Total Protein: 5.8 g/dL — ABNORMAL LOW (ref 6.5–8.1)

## 2022-10-11 LAB — DIFFERENTIAL
Abs Immature Granulocytes: 0.03 10*3/uL (ref 0.00–0.07)
Basophils Absolute: 0.1 10*3/uL (ref 0.0–0.1)
Basophils Relative: 1 %
Eosinophils Absolute: 0.1 10*3/uL (ref 0.0–0.5)
Eosinophils Relative: 1 %
Immature Granulocytes: 0 %
Lymphocytes Relative: 26 %
Lymphs Abs: 2.3 10*3/uL (ref 0.7–4.0)
Monocytes Absolute: 0.6 10*3/uL (ref 0.1–1.0)
Monocytes Relative: 6 %
Neutro Abs: 6 10*3/uL (ref 1.7–7.7)
Neutrophils Relative %: 66 %

## 2022-10-11 LAB — CUP PACEART REMOTE DEVICE CHECK
Date Time Interrogation Session: 20240928163910
Implantable Pulse Generator Implant Date: 20240618

## 2022-10-11 LAB — PROTIME-INR
INR: 0.9 (ref 0.8–1.2)
Prothrombin Time: 12.3 s (ref 11.4–15.2)

## 2022-10-11 LAB — ETHANOL: Alcohol, Ethyl (B): <10 mg/dL (ref <10)

## 2022-10-11 LAB — CBC
HCT: 32.3 % — ABNORMAL LOW (ref 36.0–46.0)
Hemoglobin: 10.7 g/dL — ABNORMAL LOW (ref 12.0–15.0)
MCH: 30.8 pg (ref 26.0–34.0)
MCHC: 33.1 g/dL (ref 30.0–36.0)
MCV: 93.1 fL (ref 80.0–100.0)
Platelets: 231 10*3/uL (ref 150–400)
RBC: 3.47 MIL/uL — ABNORMAL LOW (ref 3.87–5.11)
RDW: 12.7 % (ref 11.5–15.5)
WBC: 9.1 10*3/uL (ref 4.0–10.5)
nRBC: 0 % (ref 0.0–0.2)

## 2022-10-11 LAB — CBG MONITORING, ED: Glucose-Capillary: 401 mg/dL — ABNORMAL HIGH (ref 70–99)

## 2022-10-11 LAB — APTT: aPTT: 21 s — ABNORMAL LOW (ref 24–36)

## 2022-10-11 MED ORDER — SODIUM CHLORIDE 0.9% FLUSH: 3.0000 mL | Freq: Once | INTRAVENOUS | Status: DC

## 2022-10-11 MED ORDER — INSULIN ASPART 100 UNIT/ML IJ SOLN
8.0000 [IU] | Freq: Once | INTRAMUSCULAR | Status: AC
  Administered 2022-10-11: 8 [IU] via INTRAVENOUS

## 2022-10-11 MED ORDER — LACTATED RINGERS IV BOLUS
500.0000 mL | Freq: Once | INTRAVENOUS | Status: AC
  Administered 2022-10-11: 500 mL via INTRAVENOUS

## 2022-10-11 MED ORDER — INSULIN REGULAR HUMAN 100 UNIT/ML IJ SOLN: 8.0000 [IU] | Freq: Once | INTRAMUSCULAR | Status: DC

## 2022-10-11 NOTE — Plan of Care (Addendum)
On-call neurology note  Called by Dr. Dalene Seltzer at [facility].  Patient with a history of prior stroke with left-sided residual deficits now coming in with multiple falls and possible right-sided weakness on exam.  Also has significant hyperglycemia with blood sugars in the 460s.  CT head reveals extensive chronic white matter disease as well as sequel of prior strokes but does not necessarily reveal any acute looking findings although they might be hard to see in the setting of severe background white matter disease.  Hence, recommend MRI and if positive for stroke, then reengage neurology for formal consultation for stroke risk factor workup otherwise manage hyperglycemia and therapy evaluations for the recent falls.  I would not pursue stroke risk factor workup until an MRI has been completed and reveals an acute or subacute stroke since the symptoms have been going on for multiple days now.  Inpatient neurology will be available as needed.  -- Milon Dikes, MD Neurologist [facility] Pager: [phone]

## 2022-10-11 NOTE — ED Notes (Signed)
Patient transported to CT 

## 2022-10-11 NOTE — Procedures (Signed)
Physician Interpretation:     [facility] POLYSOMNOGRAPHY  INTERPRETATION REPORT   STUDY DATE:  10/03/2022     PATIENT NAME:  Sarah [name]         DATE OF BIRTH:  11/22/1945  PATIENT ID:  [id number]    TYPE OF STUDY:  PSG  READING PHYSICIAN: Huston Foley, MD, PhD   SCORING TECHNICIAN: Margaretann Loveless, RPSGT   Referred by: Ihor Austin, NP ? History and Indication for Testing: 77 year old female with an underlying medical history of multiple strokes, type 2 diabetes, hyperlipidemia, osteoporosis, hypertension, glaucoma, carpal tunnel syndrome, history of pneumonia, and colonic polyps, who reports snoring and excessive daytime somnolence, as well as nocturia and morning headaches. Her Epworth sleepiness score is 14 out of 24, fatigue severity score is 39 out of 63.  Height: 60 in Weight: 113 lb (BMI 22) Neck Size: 13 in    MEDICATIONS: Norvasc, Lipitor, Vitamin D3, Plavix, Zetia, Lasix, Apresoline, Toujeo Solostar, Xalatan, Victoza, Cozaar, Nitrostat, Zoloft, Topamax   TECHNICAL DESCRIPTION: A registered sleep technologist was in attendance for the duration of the recording.  Data collection, scoring, video monitoring, and reporting were performed in compliance with the AASM Manual for the Scoring of Sleep and Associated Events; (Hypopnea is scored based on the criteria listed in Section VIII D. 1b in the AASM Manual V2.6 using a 4% oxygen desaturation rule or Hypopnea is scored based on the criteria listed in Section VIII D. 1a in the AASM Manual V2.6 using 3% oxygen desaturation and /or arousal rule).   SLEEP CONTINUITY AND SLEEP ARCHITECTURE:  Lights-out was at 21:33: and lights-on at  05:31:, with a total recording time of 7 hours, 58 min. Total sleep time ( TST) was 281.0 minutes with a decreased sleep efficiency at 58.8%. There was  10.0% REM sleep.  BODY POSITION:  TST was divided  between the following sleep positions: 71.0% supine;  29.0% lateral;   0% prone. Duration of total sleep and percent of total sleep in their respective position is as follows: supine 199 minutes (71%), non-supine 82 minutes (29%); right 00 minutes (0%), left 81 minutes (29%), and prone 00 minutes (0%).  Total supine REM sleep time was 28 minutes (100% of total REM sleep).  Sleep latency was increased at 57.0 minutes.  REM sleep latency was markedly increased at 393.0 minutes. Of the total sleep time, the percentage of stage N1 sleep was 6.0%, stage N2 sleep was 70%, which is increased, stage N3 sleep was 13.7%, and REM sleep was 10.0%, which is reduced. Wake after sleep onset (WASO) time accounted for 140 minutes with mild to moderate sleep fragmentation noted.   RESPIRATORY MONITORING:   Based on CMS criteria (using a 4% oxygen desaturation rule for scoring hypopneas), there were 0 apneas (0 obstructive; 0 central; 0 mixed), and 20 hypopneas.  Apnea index was 0.0. Hypopnea index was 4.3. The apnea-hypopnea index was 4.3/hour overall (6.0 supine, 0 non-supine; 4.3 REM, 4.3 supine REM).  There were 0 respiratory effort-related arousals (RERAs).  The RERA index was 0 events/h. Total respiratory disturbance index (RDI) was 4.3 events/h. RDI results showed: supine RDI  6.0 /h; non-supine RDI 0.0 /h; REM RDI 4.3 /h, supine REM RDI 4.3 /h.   Based on AASM criteria (using a 3% oxygen desaturation and /or arousal rule for scoring hypopneas), there were 0 apneas (0 obstructive; 0 central; 0 mixed), and 20 hypopneas. Apnea index was 0.0. Hypopnea index was 4.3. The apnea-hypopnea index was 4.3  Physician Interpretation:     [facility] POLYSOMNOGRAPHY  INTERPRETATION REPORT   STUDY DATE:  10/03/2022     PATIENT NAME:  Sarah [name]         DATE OF BIRTH:  11/22/1945  PATIENT ID:  [id number]    TYPE OF STUDY:  PSG  READING PHYSICIAN: Huston Foley, MD, PhD   SCORING TECHNICIAN: Margaretann Loveless, RPSGT   Referred by: Ihor Austin, NP ? History and Indication for Testing: 77 year old female with an underlying medical history of multiple strokes, type 2 diabetes, hyperlipidemia, osteoporosis, hypertension, glaucoma, carpal tunnel syndrome, history of pneumonia, and colonic polyps, who reports snoring and excessive daytime somnolence, as well as nocturia and morning headaches. Her Epworth sleepiness score is 14 out of 24, fatigue severity score is 39 out of 63.  Height: 60 in Weight: 113 lb (BMI 22) Neck Size: 13 in    MEDICATIONS: Norvasc, Lipitor, Vitamin D3, Plavix, Zetia, Lasix, Apresoline, Toujeo Solostar, Xalatan, Victoza, Cozaar, Nitrostat, Zoloft, Topamax   TECHNICAL DESCRIPTION: A registered sleep technologist was in attendance for the duration of the recording.  Data collection, scoring, video monitoring, and reporting were performed in compliance with the AASM Manual for the Scoring of Sleep and Associated Events; (Hypopnea is scored based on the criteria listed in Section VIII D. 1b in the AASM Manual V2.6 using a 4% oxygen desaturation rule or Hypopnea is scored based on the criteria listed in Section VIII D. 1a in the AASM Manual V2.6 using 3% oxygen desaturation and /or arousal rule).   SLEEP CONTINUITY AND SLEEP ARCHITECTURE:  Lights-out was at 21:33: and lights-on at  05:31:, with a total recording time of 7 hours, 58 min. Total sleep time ( TST) was 281.0 minutes with a decreased sleep efficiency at 58.8%. There was  10.0% REM sleep.  BODY POSITION:  TST was divided  between the following sleep positions: 71.0% supine;  29.0% lateral;   0% prone. Duration of total sleep and percent of total sleep in their respective position is as follows: supine 199 minutes (71%), non-supine 82 minutes (29%); right 00 minutes (0%), left 81 minutes (29%), and prone 00 minutes (0%).  Total supine REM sleep time was 28 minutes (100% of total REM sleep).  Sleep latency was increased at 57.0 minutes.  REM sleep latency was markedly increased at 393.0 minutes. Of the total sleep time, the percentage of stage N1 sleep was 6.0%, stage N2 sleep was 70%, which is increased, stage N3 sleep was 13.7%, and REM sleep was 10.0%, which is reduced. Wake after sleep onset (WASO) time accounted for 140 minutes with mild to moderate sleep fragmentation noted.   RESPIRATORY MONITORING:   Based on CMS criteria (using a 4% oxygen desaturation rule for scoring hypopneas), there were 0 apneas (0 obstructive; 0 central; 0 mixed), and 20 hypopneas.  Apnea index was 0.0. Hypopnea index was 4.3. The apnea-hypopnea index was 4.3/hour overall (6.0 supine, 0 non-supine; 4.3 REM, 4.3 supine REM).  There were 0 respiratory effort-related arousals (RERAs).  The RERA index was 0 events/h. Total respiratory disturbance index (RDI) was 4.3 events/h. RDI results showed: supine RDI  6.0 /h; non-supine RDI 0.0 /h; REM RDI 4.3 /h, supine REM RDI 4.3 /h.   Based on AASM criteria (using a 3% oxygen desaturation and /or arousal rule for scoring hypopneas), there were 0 apneas (0 obstructive; 0 central; 0 mixed), and 20 hypopneas. Apnea index was 0.0. Hypopnea index was 4.3. The apnea-hypopnea index was 4.3  overall (6.0 supine, 0 non-supine; 4.3 REM, 4.3 supine REM).  There were 0 respiratory effort-related arousals (RERAs).  The RERA index was 0 events/h. Total respiratory disturbance index (RDI) was 4.3 events/h. RDI results showed: supine RDI  6.0 /h; non-supine RDI 0.0 /h; REM RDI 4.3 /h, supine REM RDI 4.3 /h.  OXIMETRY: Oxyhemoglobin Saturation Nadir during sleep was at  87% from a mean of  95%.  Of the Total sleep time (TST)   hypoxemia (=<88%) was present for  0.2 minutes, or 0.1% of total sleep time.  LIMB MOVEMENTS: There were 33 periodic limb movements of sleep (7.0/hr), of which 0 (0.0/hr) were associated with an arousal.   AROUSAL: There were 96 arousals in total, for an arousal index of 20 arousals/hour.  Of these, 14 were identified as respiratory-related arousals (3 /h), 0 were PLM-related arousals (0 /h), and 95 were non-specific arousals (20 /h).    EEG: Review of the EEG showed no abnormal electrical discharges and symmetrical bihemispheric findings.    EKG: The EKG revealed normal sinus rhythm (NSR). The average heart rate during sleep was 65 bpm.   AUDIO/VIDEO REVIEW: The audio and video review did not show any abnormal or unusual behaviors, movements, phonations or vocalizations. Her glucose monitor beeped intermittently for elevated blood sugar values. The patient took 4 restroom breaks. Snoring was noted, in the mild to moderate range intermittently.  POST-STUDY QUESTIONNAIRE: Post study, the patient indicated, that sleep was the same as usual.   IMPRESSION:  1. Primary Snoring 2. Dysfunctions associated with sleep stages or arousal from sleep  RECOMMENDATIONS:  1. This study does not demonstrate any significant obstructive or central sleep disordered breathing with an AHI of less than 5/hour - Her total AHI was 4.3/hour - and oxygen desaturation nadir was 87%. Mild to moderate intermittent snoring was noted. Treatment with a positive airway pressure device, such as CPAP or autoPAP is not indicated.  2. This study shows sleep fragmentation and abnormal sleep stage percentages; these are nonspecific findings and per se do not signify an intrinsic sleep disorder or a cause for the patient's sleep-related symptoms. Causes include (but are not limited to) the first night effect of the sleep study, circadian rhythm disturbances, medication effect or an underlying mood  disorder or medical problem.  3. The patient should be cautioned not to drive, work at heights, or operate dangerous or heavy equipment when tired or sleepy. Review and reiteration of good sleep hygiene measures should be pursued with any patient. 4. The patient will be advised to follow up with the referring provider, who will be notified of the test results.     I certify that I have reviewed the entire raw data recording prior to the issuance of this report in accordance with the Standards of Accreditation of the American Academy of Sleep Medicine (AASM).  Huston Foley, MD, PhD Medical Director, [facility]) Diplomat, ABPN (Neurology and Sleep)               Technical Report:   General Information  Name: Oralia, Criger BMI: 22.07 Physician: Huston Foley, MD  ID: [id number] Height: 60.0 in Technician: Margaretann Loveless, RPSGT  Sex: Female Weight: 113.0 lb Record: [id number]  Age: 60 [[birth year]] Date: 10/03/2022    Medical & Medication History    77 year old female with an underlying medical history of multiple strokes, including stroke of the left cerebellum and left parietal lobe in October 2023, and prior right MCA territory and  overall (6.0 supine, 0 non-supine; 4.3 REM, 4.3 supine REM).  There were 0 respiratory effort-related arousals (RERAs).  The RERA index was 0 events/h. Total respiratory disturbance index (RDI) was 4.3 events/h. RDI results showed: supine RDI  6.0 /h; non-supine RDI 0.0 /h; REM RDI 4.3 /h, supine REM RDI 4.3 /h.  OXIMETRY: Oxyhemoglobin Saturation Nadir during sleep was at  87% from a mean of  95%.  Of the Total sleep time (TST)   hypoxemia (=<88%) was present for  0.2 minutes, or 0.1% of total sleep time.  LIMB MOVEMENTS: There were 33 periodic limb movements of sleep (7.0/hr), of which 0 (0.0/hr) were associated with an arousal.   AROUSAL: There were 96 arousals in total, for an arousal index of 20 arousals/hour.  Of these, 14 were identified as respiratory-related arousals (3 /h), 0 were PLM-related arousals (0 /h), and 95 were non-specific arousals (20 /h).    EEG: Review of the EEG showed no abnormal electrical discharges and symmetrical bihemispheric findings.    EKG: The EKG revealed normal sinus rhythm (NSR). The average heart rate during sleep was 65 bpm.   AUDIO/VIDEO REVIEW: The audio and video review did not show any abnormal or unusual behaviors, movements, phonations or vocalizations. Her glucose monitor beeped intermittently for elevated blood sugar values. The patient took 4 restroom breaks. Snoring was noted, in the mild to moderate range intermittently.  POST-STUDY QUESTIONNAIRE: Post study, the patient indicated, that sleep was the same as usual.   IMPRESSION:  1. Primary Snoring 2. Dysfunctions associated with sleep stages or arousal from sleep  RECOMMENDATIONS:  1. This study does not demonstrate any significant obstructive or central sleep disordered breathing with an AHI of less than 5/hour - Her total AHI was 4.3/hour - and oxygen desaturation nadir was 87%. Mild to moderate intermittent snoring was noted. Treatment with a positive airway pressure device, such as CPAP or autoPAP is not indicated.  2. This study shows sleep fragmentation and abnormal sleep stage percentages; these are nonspecific findings and per se do not signify an intrinsic sleep disorder or a cause for the patient's sleep-related symptoms. Causes include (but are not limited to) the first night effect of the sleep study, circadian rhythm disturbances, medication effect or an underlying mood  disorder or medical problem.  3. The patient should be cautioned not to drive, work at heights, or operate dangerous or heavy equipment when tired or sleepy. Review and reiteration of good sleep hygiene measures should be pursued with any patient. 4. The patient will be advised to follow up with the referring provider, who will be notified of the test results.     I certify that I have reviewed the entire raw data recording prior to the issuance of this report in accordance with the Standards of Accreditation of the American Academy of Sleep Medicine (AASM).  Huston Foley, MD, PhD Medical Director, [facility]) Diplomat, ABPN (Neurology and Sleep)               Technical Report:   General Information  Name: Oralia, Criger BMI: 22.07 Physician: Huston Foley, MD  ID: [id number] Height: 60.0 in Technician: Margaretann Loveless, RPSGT  Sex: Female Weight: 113.0 lb Record: [id number]  Age: 60 [[birth year]] Date: 10/03/2022    Medical & Medication History    77 year old female with an underlying medical history of multiple strokes, including stroke of the left cerebellum and left parietal lobe in October 2023, and prior right MCA territory and

## 2022-10-11 NOTE — Plan of Care (Signed)
Plan of Care Note for accepted transfer   Patient name: Sarah [name] HYQ:[id number] DOB: Sep 09, 1945  Facility requesting transfer: [facility] Requesting Provider: Dr. Dalene Seltzer Reason for transfer: Stroke workup Facility course: 77 year old female with history of hypertension, hyperlipidemia, type 2 diabetes, prior stroke with left-sided residual deficits which had improved now coming in with multiple falls and right-sided weakness since Wednesday, 9/25.  Blood pressure elevated.  Also has significant hyperglycemia with blood glucose 459 without signs of DKA.  CT head (results not posted yet by radiology) but per neurology "reveals extensive chronic white matter disease as well as sequel of prior strokes but does not necessarily reveal any acute looking findings although they might be hard to see in the setting of severe background white matter disease." Neurology recommended MRI and if positive then formally consult their team for stroke workup.  Patient was given NovoLog 8 units and 500 cc IV fluids.  Plan of care: The patient is accepted for admission to Telemetry unit at [facility].  [facility] will assume care on arrival to accepting facility. Until arrival, care as per EDP. However, [facility] available 24/7 for questions and assistance.  Check [email or web address] for on-call coverage.  Nursing staff, please call [facility] Admits & Consults System-Wide number under Amion on patient's arrival so appropriate admitting provider can evaluate the pt.

## 2022-10-11 NOTE — ED Provider Notes (Signed)
[place] EMERGENCY DEPARTMENT AT [facility] Provider Note   CSN: [id number] Arrival date & time: 10/11/22  1840     History {Add pertinent medical, surgical, social history, OB history to HPI:1} Chief Complaint  Patient presents with   Weakness    Sarah [name] is a 77 y.o. female.  HPI      Hx of CVA with left sided weakness that improved Since Wednesday, new right sided arm and leg weakness. Denies other symptoms. Has had 2 falls.   Home Medications Prior to Admission medications   Medication Sig Start Date End Date Taking? Authorizing Provider  amLODipine (NORVASC) 5 MG tablet Take 1 tablet (5 mg total) by mouth daily. 02/16/22   [place], [name], MD  cholecalciferol (VITAMIN D3) 25 MCG (1000 UNIT) tablet Take 1,000 Units by mouth daily.    [provider]  clopidogrel (PLAVIX) 75 MG tablet TAKE 1 TABLET BY MOUTH DAILY 08/24/22   [place], [name], MD  Continuous Glucose Receiver (DEXCOM G7 RECEIVER) DEVI USE TO MONITOR GLUCOSE 08/24/22   Shamleffer, Konrad Dolores, MD  Continuous Glucose Sensor (DEXCOM G7 SENSOR) MISC 1 Device by Does not apply route as directed. 05/10/22   Shamleffer, Konrad Dolores, MD  dapagliflozin propanediol (FARXIGA) 10 MG TABS tablet Take 1 tablet (10 mg total) by mouth daily. 05/10/22   Shamleffer, Konrad Dolores, MD  ezetimibe (ZETIA) 10 MG tablet Take 1 tablet (10 mg total) by mouth daily. 08/24/22   [place], [name], MD  furosemide (LASIX) 20 MG tablet Take 1 tablet by mouth daily (only as needed) 12/08/21   Dyann Kief, PA-C  hydrALAZINE (APRESOLINE) 10 MG tablet Take 2 tablets (20 mg total) by mouth 3 (three) times daily. 02/16/22   [place], [name], MD  insulin glargine, 1 Unit Dial, (TOUJEO SOLOSTAR) 300 UNIT/ML Solostar Pen Inject 14 Units into the skin daily in the afternoon. 05/10/22   Shamleffer, Konrad Dolores, MD  insulin lispro (HUMALOG KWIKPEN) 100 UNIT/ML KwikPen Max daily 30 units 05/10/22   Shamleffer, Konrad Dolores, MD  Insulin Pen Needle 32G X 4 MM MISC 1 Device by Does not apply route in the morning, at noon, in the evening, and at bedtime. 05/10/22   Shamleffer, Konrad Dolores, MD  latanoprost (XALATAN) 0.005 % ophthalmic solution Place 1 drop into both eyes at bedtime.    [provider]  liraglutide (VICTOZA) 18 MG/3ML SOPN Inject 1.2 mg into the skin daily. 05/10/22   Shamleffer, Konrad Dolores, MD  losartan (COZAAR) 50 MG tablet TAKE 1 TABLET BY MOUTH DAILY 08/24/22   [place], [name], MD  Magnesium 250 MG TABS Take 250 mg by mouth daily.    [provider]  nitroGLYCERIN (NITROSTAT) 0.4 MG SL tablet Place 1 tablet (0.4 mg total) under the tongue every 5 (five) minutes as needed for chest pain. one tab every 5 minutes up to 3 tablets total over 15 minutes. 02/29/20   Chandrasekhar, Lafayette Dragon A, MD  ondansetron (ZOFRAN) 4 MG tablet Take 1 tablet (4 mg total) by mouth 2 (two) times daily as needed for nausea or vomiting. 12/11/21   [place], [name], MD  rosuvastatin (CRESTOR) 20 MG tablet Take 1 tablet (20 mg total) by mouth daily. 04/11/22   [place], [name], MD  sertraline (ZOLOFT) 50 MG tablet TAKE 1 TABLET(50 MG) BY MOUTH DAILY 02/16/22   [place], [name], MD  topiramate (TOPAMAX) 25 MG tablet Take 1 tablet (25 mg total) by mouth at bedtime. 09/07/22  Ihor Austin, NP      Allergies    Patient has no known allergies.    Review of Systems   Review of Systems  Physical Exam Updated Vital Signs BP (!) 177/156 (BP Location: Right Arm)   Pulse 89   Temp 98 F (36.7 C)   Resp 18   SpO2 99%  Physical Exam  ED Results / Procedures / Treatments   Labs (all labs ordered are listed, but only abnormal results are displayed) Labs Reviewed  APTT - Abnormal; Notable for the following components:      Result Value   aPTT 21 (*)    All other components within normal limits  CBC - Abnormal; Notable for the following components:   RBC 3.47 (*)    Hemoglobin 10.7 (*)    HCT 32.3 (*)     All other components within normal limits  COMPREHENSIVE METABOLIC PANEL - Abnormal; Notable for the following components:   Glucose, Bld 459 (*)    Creatinine, Ser 1.01 (*)    Total Protein 5.8 (*)    Albumin 2.7 (*)    ALT 56 (*)    GFR, Estimated 58 (*)    All other components within normal limits  CBG MONITORING, ED - Abnormal; Notable for the following components:   Glucose-Capillary 401 (*)    All other components within normal limits  PROTIME-INR  DIFFERENTIAL  ETHANOL  CBG MONITORING, ED    EKG None  Radiology No results found.  Procedures Procedures  {Document cardiac monitor, telemetry assessment procedure when appropriate:1}  Medications Ordered in ED Medications  sodium chloride flush (NS) 0.9 % injection 3 mL (3 mLs Intravenous Not Given 10/11/22 1928)    ED Course/ Medical Decision Making/ A&P   {   Click here for ABCD2, HEART and other calculatorsREFRESH Note before signing :1}                              Medical Decision Making Amount and/or Complexity of Data Reviewed Labs: ordered. Radiology: ordered.   ***  {Document critical care time when appropriate:1} {Document review of labs and clinical decision tools ie heart score, Chads2Vasc2 etc:1}  {Document your independent review of radiology images, and any outside records:1} {Document your discussion with family members, caretakers, and with consultants:1} {Document social determinants of health affecting pt's care:1} {Document your decision making why or why not admission, treatments were needed:1} Final Clinical Impression(s) / ED Diagnoses Final diagnoses:  None    Rx / DC Orders ED Discharge Orders     None

## 2022-10-11 NOTE — ED Triage Notes (Signed)
Pt is here for right sided weakness which began yesterday.  Pt has right sided weakness and right arm drift.  Pt reports right leg weakness. Wednesday of last week. Pt is alert and oriented.  Speech clear.  Pt had two falls this weekend due to weakness.

## 2022-10-12 ENCOUNTER — Observation Stay (HOSPITAL_COMMUNITY): Payer: Medicare Other

## 2022-10-12 DIAGNOSIS — R931 Abnormal findings on diagnostic imaging of heart and coronary circulation: Secondary | ICD-10-CM | POA: Diagnosis not present

## 2022-10-12 DIAGNOSIS — R29700 NIHSS score 0: Secondary | ICD-10-CM | POA: Diagnosis present

## 2022-10-12 DIAGNOSIS — R29702 NIHSS score 2: Secondary | ICD-10-CM | POA: Diagnosis not present

## 2022-10-12 DIAGNOSIS — I358 Other nonrheumatic aortic valve disorders: Secondary | ICD-10-CM | POA: Diagnosis not present

## 2022-10-12 DIAGNOSIS — I2510 Atherosclerotic heart disease of native coronary artery without angina pectoris: Secondary | ICD-10-CM | POA: Diagnosis present

## 2022-10-12 DIAGNOSIS — Z79899 Other long term (current) drug therapy: Secondary | ICD-10-CM | POA: Diagnosis not present

## 2022-10-12 DIAGNOSIS — I69354 Hemiplegia and hemiparesis following cerebral infarction affecting left non-dominant side: Secondary | ICD-10-CM | POA: Diagnosis not present

## 2022-10-12 DIAGNOSIS — G8191 Hemiplegia, unspecified affecting right dominant side: Secondary | ICD-10-CM | POA: Diagnosis present

## 2022-10-12 DIAGNOSIS — Z880 Allergy status to penicillin: Secondary | ICD-10-CM | POA: Diagnosis not present

## 2022-10-12 DIAGNOSIS — R531 Weakness: Secondary | ICD-10-CM

## 2022-10-12 DIAGNOSIS — Z87891 Personal history of nicotine dependence: Secondary | ICD-10-CM | POA: Diagnosis not present

## 2022-10-12 DIAGNOSIS — I10 Essential (primary) hypertension: Secondary | ICD-10-CM | POA: Diagnosis present

## 2022-10-12 DIAGNOSIS — Z7902 Long term (current) use of antithrombotics/antiplatelets: Secondary | ICD-10-CM | POA: Diagnosis not present

## 2022-10-12 DIAGNOSIS — I63512 Cerebral infarction due to unspecified occlusion or stenosis of left middle cerebral artery: Secondary | ICD-10-CM | POA: Diagnosis present

## 2022-10-12 DIAGNOSIS — I639 Cerebral infarction, unspecified: Secondary | ICD-10-CM | POA: Diagnosis present

## 2022-10-12 DIAGNOSIS — E785 Hyperlipidemia, unspecified: Secondary | ICD-10-CM | POA: Diagnosis present

## 2022-10-12 DIAGNOSIS — Z8249 Family history of ischemic heart disease and other diseases of the circulatory system: Secondary | ICD-10-CM | POA: Diagnosis not present

## 2022-10-12 DIAGNOSIS — M810 Age-related osteoporosis without current pathological fracture: Secondary | ICD-10-CM | POA: Diagnosis present

## 2022-10-12 DIAGNOSIS — N179 Acute kidney failure, unspecified: Secondary | ICD-10-CM | POA: Diagnosis present

## 2022-10-12 DIAGNOSIS — R2990 Unspecified symptoms and signs involving the nervous system: Secondary | ICD-10-CM

## 2022-10-12 DIAGNOSIS — I6389 Other cerebral infarction: Secondary | ICD-10-CM | POA: Diagnosis not present

## 2022-10-12 DIAGNOSIS — Z794 Long term (current) use of insulin: Secondary | ICD-10-CM | POA: Diagnosis not present

## 2022-10-12 DIAGNOSIS — E1165 Type 2 diabetes mellitus with hyperglycemia: Secondary | ICD-10-CM | POA: Diagnosis present

## 2022-10-12 DIAGNOSIS — I359 Nonrheumatic aortic valve disorder, unspecified: Secondary | ICD-10-CM | POA: Diagnosis not present

## 2022-10-12 DIAGNOSIS — I700 Atherosclerosis of aorta: Secondary | ICD-10-CM | POA: Diagnosis present

## 2022-10-12 DIAGNOSIS — Z90710 Acquired absence of both cervix and uterus: Secondary | ICD-10-CM | POA: Diagnosis not present

## 2022-10-12 DIAGNOSIS — W1830XA Fall on same level, unspecified, initial encounter: Secondary | ICD-10-CM | POA: Diagnosis present

## 2022-10-12 DIAGNOSIS — Z833 Family history of diabetes mellitus: Secondary | ICD-10-CM | POA: Diagnosis not present

## 2022-10-12 DIAGNOSIS — Z860101 Personal history of adenomatous and serrated colon polyps: Secondary | ICD-10-CM | POA: Diagnosis not present

## 2022-10-12 DIAGNOSIS — Z1152 Encounter for screening for COVID-19: Secondary | ICD-10-CM | POA: Diagnosis not present

## 2022-10-12 DIAGNOSIS — Z955 Presence of coronary angioplasty implant and graft: Secondary | ICD-10-CM | POA: Diagnosis not present

## 2022-10-12 LAB — BASIC METABOLIC PANEL
Anion gap: 6 (ref 5–15)
BUN: 10 mg/dL (ref 8–23)
CO2: 24 mmol/L (ref 22–32)
Calcium: 9.1 mg/dL (ref 8.9–10.3)
Chloride: 110 mmol/L (ref 98–111)
Creatinine, Ser: 0.97 mg/dL (ref 0.44–1.00)
GFR, Estimated: >60 mL/min (ref >60)
Glucose, Bld: 184 mg/dL — ABNORMAL HIGH (ref 70–99)
Potassium: 2.9 mmol/L — ABNORMAL LOW (ref 3.5–5.1)
Sodium: 140 mmol/L (ref 135–145)

## 2022-10-12 LAB — HEMOGLOBIN A1C
Hgb A1c MFr Bld: 12.2 % — ABNORMAL HIGH (ref 4.8–5.6)
Mean Plasma Glucose: 303.44 mg/dL

## 2022-10-12 LAB — CBC
HCT: 33.1 % — ABNORMAL LOW (ref 36.0–46.0)
Hemoglobin: 11 g/dL — ABNORMAL LOW (ref 12.0–15.0)
MCH: 30.4 pg (ref 26.0–34.0)
MCHC: 33.2 g/dL (ref 30.0–36.0)
MCV: 91.4 fL (ref 80.0–100.0)
Platelets: 318 10*3/uL (ref 150–400)
RBC: 3.62 MIL/uL — ABNORMAL LOW (ref 3.87–5.11)
RDW: 12.4 % (ref 11.5–15.5)
WBC: 8.5 10*3/uL (ref 4.0–10.5)
nRBC: 0 % (ref 0.0–0.2)

## 2022-10-12 LAB — PHOSPHORUS: Phosphorus: 3.9 mg/dL (ref 2.5–4.6)

## 2022-10-12 LAB — URINALYSIS, W/ REFLEX TO CULTURE (INFECTION SUSPECTED)
Bacteria, UA: NONE SEEN
Bilirubin Urine: NEGATIVE
Glucose, UA: >=500 mg/dL — AB
Ketones, ur: NEGATIVE mg/dL
Leukocytes,Ua: NEGATIVE
Nitrite: NEGATIVE
Protein, ur: >=300 mg/dL — AB
Specific Gravity, Urine: 1.013 (ref 1.005–1.030)
pH: 7 (ref 5.0–8.0)

## 2022-10-12 LAB — GLUCOSE, CAPILLARY
Glucose-Capillary: 130 mg/dL — ABNORMAL HIGH (ref 70–99)
Glucose-Capillary: 171 mg/dL — ABNORMAL HIGH (ref 70–99)
Glucose-Capillary: 197 mg/dL — ABNORMAL HIGH (ref 70–99)
Glucose-Capillary: 176 mg/dL — ABNORMAL HIGH (ref 70–99)
Glucose-Capillary: 302 mg/dL — ABNORMAL HIGH (ref 70–99)

## 2022-10-12 LAB — LIPID PANEL
Cholesterol: 139 mg/dL (ref 0–200)
HDL: 79 mg/dL (ref >40)
LDL Cholesterol: 44 mg/dL (ref 0–99)
Total CHOL/HDL Ratio: 1.8 {ratio}
Triglycerides: 79 mg/dL (ref <150)
VLDL: 16 mg/dL (ref 0–40)

## 2022-10-12 LAB — MAGNESIUM: Magnesium: 1.9 mg/dL (ref 1.7–2.4)

## 2022-10-12 MED ORDER — INSULIN GLARGINE-YFGN 100 UNIT/ML ~~LOC~~ SOLN
10.0000 [IU] | Freq: Every day | SUBCUTANEOUS | Status: DC
  Administered 2022-10-12 – 2022-10-16 (×4): 10 [IU] via SUBCUTANEOUS
  Filled 2022-10-12 (×5): qty 0.1

## 2022-10-12 MED ORDER — EZETIMIBE 10 MG PO TABS
10.0000 mg | ORAL_TABLET | Freq: Every day | ORAL | Status: DC
  Administered 2022-10-12 – 2022-10-16 (×5): 10 mg via ORAL
  Filled 2022-10-12 (×5): qty 1

## 2022-10-12 MED ORDER — SERTRALINE HCL 50 MG PO TABS
50.0000 mg | ORAL_TABLET | Freq: Every day | ORAL | Status: DC
  Administered 2022-10-12 – 2022-10-16 (×5): 50 mg via ORAL
  Filled 2022-10-12 (×5): qty 1

## 2022-10-12 MED ORDER — HYDRALAZINE HCL 25 MG PO TABS: 25.0000 mg | ORAL_TABLET | Freq: Four times a day (QID) | ORAL | Status: DC | PRN

## 2022-10-12 MED ORDER — ROSUVASTATIN CALCIUM 20 MG PO TABS
20.0000 mg | ORAL_TABLET | Freq: Every day | ORAL | Status: DC
  Administered 2022-10-12 – 2022-10-16 (×5): 20 mg via ORAL
  Filled 2022-10-12 (×5): qty 1

## 2022-10-12 MED ORDER — STROKE: EARLY STAGES OF RECOVERY BOOK
Freq: Once | Status: AC
  Filled 2022-10-12: qty 1

## 2022-10-12 MED ORDER — ORAL CARE MOUTH RINSE: 15.0000 mL | OROMUCOSAL | Status: DC | PRN

## 2022-10-12 MED ORDER — IOHEXOL 350 MG/ML SOLN
75.0000 mL | Freq: Once | INTRAVENOUS | Status: AC | PRN
  Administered 2022-10-12: 75 mL via INTRAVENOUS

## 2022-10-12 MED ORDER — INSULIN ASPART 100 UNIT/ML IJ SOLN
0.0000 [IU] | Freq: Every day | INTRAMUSCULAR | Status: DC
  Administered 2022-10-12: 4 [IU] via SUBCUTANEOUS
  Administered 2022-10-13 – 2022-10-14 (×2): 3 [IU] via SUBCUTANEOUS

## 2022-10-12 MED ORDER — SODIUM CHLORIDE 0.9% FLUSH
3.0000 mL | Freq: Two times a day (BID) | INTRAVENOUS | Status: DC
  Administered 2022-10-12 – 2022-10-16 (×9): 3 mL via INTRAVENOUS

## 2022-10-12 MED ORDER — TOPIRAMATE 25 MG PO TABS
25.0000 mg | ORAL_TABLET | Freq: Every day | ORAL | Status: DC
  Administered 2022-10-12 – 2022-10-16 (×5): 25 mg via ORAL
  Filled 2022-10-12 (×5): qty 1

## 2022-10-12 MED ORDER — CLOPIDOGREL BISULFATE 75 MG PO TABS
75.0000 mg | ORAL_TABLET | Freq: Every day | ORAL | Status: DC
  Administered 2022-10-12 – 2022-10-13 (×2): 75 mg via ORAL
  Filled 2022-10-12 (×2): qty 1

## 2022-10-12 MED ORDER — INSULIN ASPART 100 UNIT/ML IJ SOLN
0.0000 [IU] | Freq: Three times a day (TID) | INTRAMUSCULAR | Status: DC
  Administered 2022-10-12 (×3): 2 [IU] via SUBCUTANEOUS
  Administered 2022-10-13: 5 [IU] via SUBCUTANEOUS
  Administered 2022-10-13: 1 [IU] via SUBCUTANEOUS
  Administered 2022-10-13: 3 [IU] via SUBCUTANEOUS
  Administered 2022-10-14: 2 [IU] via SUBCUTANEOUS
  Administered 2022-10-15: 3 [IU] via SUBCUTANEOUS
  Administered 2022-10-15: 1 [IU] via SUBCUTANEOUS
  Administered 2022-10-15: 5 [IU] via SUBCUTANEOUS
  Administered 2022-10-16: 3 [IU] via SUBCUTANEOUS
  Administered 2022-10-16: 2 [IU] via SUBCUTANEOUS

## 2022-10-12 NOTE — Inpatient Diabetes Management (Signed)
Inpatient Diabetes Program Recommendations  AACE/ADA: New Consensus Statement on Inpatient Glycemic Control (2015)  Target Ranges:  Prepandial:   less than 140 mg/dL      Peak postprandial:   less than 180 mg/dL (1-2 hours)      Critically ill patients:  140 - 180 mg/dL   Lab Results  Component Value Date   GLUCAP 176 (H) 10/12/2022   HGBA1C 12.2 (H) 10/12/2022    Review of Glycemic Control  Diabetes history: DM2 Outpatient Diabetes medications: Farxiga 10 mg daily,  Toujeo 14 units daily, Humalog 4 units TID, Victoza 1.2 mg daily Current orders for Inpatient glycemic control: Semglee 10 daily, Novolog 0-9 units TID with meals and 0-5 HS   HgbA1C - 12.1% (up from 8.5% on 05/10/22)  Inpatient Diabetes Program Recommendations:    Spoke with pt regarding her HgbA1C of 12.2%. Pt states it was 8.5% at her Endo visit in April 2024. States she's been drinking soda and sometimes forgets to take her diabetes meds and insulin. Gave good options for other beverages such as diet soda, Crystal Light, Gatorade 0. Pt said she will get back on track.  Pt has Dexcom G7 CGM. Explained how hyperglycemia leads to damage within blood vessels which lead to the common complications seen with uncontrolled diabetes. Stressed to the patient the importance of improving glycemic control to prevent further complications from uncontrolled diabetes. Discussed impact of nutrition, exercise, stress, sickness, and medications on diabetes control. Pt appreciative of visit.  Thank you. Ailene Ards, RD, LDN, CDCES Inpatient Diabetes Coordinator [phone]

## 2022-10-12 NOTE — Consult Note (Signed)
NEURO HOSPITALIST CONSULT NOTE   Requestig physician: Dr. Sharl Ma  Reason for Consult: 2 days of right-sided weakness and falls  History obtained from:  Patient and Chart     HPI:                                                                                                                                          Sarah [name] is a 77 y.o. female with a PMHx of glaucoma, bilateral carpal tunnel syndrome, DM, HTN, HLD, CAD s/p stent placement, current loop recorder in place, adenomatous colonic polyps, large prior right MCA stroke with residual left hemiparesis and DM who initially presented to [facility] yesterday evening for evaluation after experiencing new-onset of right sided leg weakness with falls over the past 24 hours. LKN was 9/29 at 5 PM, at which time she first noticed the RLE weakness. She fell twice on Sunday and has also had additional falls after the first two. She denied any speech deficit, headache or vision changes. She was transferred from [facility] to the [facility] ED for MRI.   MRI brain reveals 3 punctate acute ischemic infarctions in the left MCA territory. Also noted on MRI is a large chronic right MCA stroke.    On exam, patient endorses baseline dysarthria and worsening vision (follows with ophthalmology outpatient). Right sided weakness present but able to resist with arm and lift against gravity with leg with no drift. Oriented to time place and situation. Endorses right-sided headache, says she has had them since her previous stroke and takes Topamax at home, which relieves them.   Home medications include Plavix. Patient states she often forgets to take her Plavix at home.   Past Medical History:  Diagnosis Date   Bilateral carpal tunnel syndrome 03/27/2019   Boils    Glaucoma    Heart murmur    HTN (hypertension)    Hx of adenomatous colonic polyps    Hyperlipidemia    Osteoporosis    Pneumonia    Stroke (HCC)    Type II or unspecified type  diabetes mellitus without mention of complication, uncontrolled     Past Surgical History:  Procedure Laterality Date   ABDOMINAL HYSTERECTOMY     CARPAL TUNNEL RELEASE     right   COLONOSCOPY  12-10-10   per Dr. Arlyce Dice, clear, repeat in 7 yrs    CORONARY STENT INTERVENTION N/A 03/11/2020   Procedure: CORONARY STENT INTERVENTION;  Surgeon: Corky Crafts, MD;  Location: [facility] INVASIVE CV LAB;  Service: Cardiovascular;  Laterality: N/A;   CORONARY ULTRASOUND/IVUS N/A 03/11/2020   Procedure: Intravascular Ultrasound/IVUS;  Surgeon: Corky Crafts, MD;  Location: [facility] INVASIVE CV LAB;  Service: Cardiovascular;  Laterality: N/A;   INCISION AND DRAINAGE PERIRECTAL ABSCESS N/A 09/26/2015   Procedure:  IRRIGATION AND DEBRIDEMENT PERIRECTAL ABSCESS;  Surgeon: Rodman Pickle, MD;  Location: [facility] OR;  Service: General;  Laterality: N/A;   KNEE ARTHROSCOPY     right   LEFT HEART CATH AND CORONARY ANGIOGRAPHY N/A 03/11/2020   Procedure: LEFT HEART CATH AND CORONARY ANGIOGRAPHY;  Surgeon: Corky Crafts, MD;  Location: [facility] INVASIVE CV LAB;  Service: Cardiovascular;  Laterality: N/A;   LOOP RECORDER INSERTION N/A 10/19/2016   Procedure: LOOP RECORDER INSERTION;  Surgeon: Hillis Range, MD;  Location: [facility] INVASIVE CV LAB;  Service: Cardiovascular;  Laterality: N/A;   ORIF ANKLE FRACTURE Right 03/24/2012   Procedure: OPEN REDUCTION INTERNAL FIXATION (ORIF) ANKLE FRACTURE;  Surgeon: Nadara Mustard, MD;  Location: [facility] OR;  Service: Orthopedics;  Laterality: Right;  Open Reduction Internal Fixation Right Bimalleolar ankle fracture   POLYPECTOMY     TEE WITHOUT CARDIOVERSION N/A 10/18/2016   Procedure: TRANSESOPHAGEAL ECHOCARDIOGRAM (TEE);  Surgeon: Pricilla Riffle, MD;  Location: [facility] ENDOSCOPY;  Service: Cardiovascular;  Laterality: N/A;   TONSILLECTOMY      Family History  Problem Relation Age of Onset   Diabetes Mother    Hypertension Mother    Stroke Father    Stroke Maternal Uncle    Stroke  Paternal Uncle    Colon cancer Neg Hx    Esophageal cancer Neg Hx    Rectal cancer Neg Hx    Stomach cancer Neg Hx    Inflammatory bowel disease Neg Hx    Liver disease Neg Hx    Pancreatic cancer Neg Hx              Social History:  reports that she quit smoking about 5 years ago. Her smoking use included cigarettes. She has never used smokeless tobacco. She reports current alcohol use. She reports that she does not use drugs.  No Known Allergies  HOME MEDICATIONS:                                                                                                                      No current facility-administered medications on file prior to encounter.   Current Outpatient Medications on File Prior to Encounter  Medication Sig Dispense Refill   amLODipine (NORVASC) 5 MG tablet Take 1 tablet (5 mg total) by mouth daily. 90 tablet 3   cholecalciferol (VITAMIN D3) 25 MCG (1000 UNIT) tablet Take 1,000 Units by mouth daily.     clopidogrel (PLAVIX) 75 MG tablet TAKE 1 TABLET BY MOUTH DAILY 90 tablet 1   dapagliflozin propanediol (FARXIGA) 10 MG TABS tablet Take 1 tablet (10 mg total) by mouth daily. 90 tablet 3   ezetimibe (ZETIA) 10 MG tablet Take 1 tablet (10 mg total) by mouth daily. 90 tablet 3   furosemide (LASIX) 20 MG tablet Take 1 tablet by mouth daily (only as needed) 90 tablet 3   hydrALAZINE (APRESOLINE) 10 MG tablet Take 2 tablets (20 mg total) by mouth 3 (three) times daily. 240 tablet 3   insulin  glargine, 1 Unit Dial, (TOUJEO SOLOSTAR) 300 UNIT/ML Solostar Pen Inject 14 Units into the skin daily in the afternoon. 15 mL 3   insulin lispro (HUMALOG KWIKPEN) 100 UNIT/ML KwikPen Max daily 30 units 30 mL 3   latanoprost (XALATAN) 0.005 % ophthalmic solution Place 1 drop into both eyes at bedtime.     liraglutide (VICTOZA) 18 MG/3ML SOPN Inject 1.2 mg into the skin daily. 9 mL 1   losartan (COZAAR) 50 MG tablet TAKE 1 TABLET BY MOUTH DAILY 90 tablet 3   Magnesium 250 MG TABS Take  250 mg by mouth daily.     nitroGLYCERIN (NITROSTAT) 0.4 MG SL tablet Place 1 tablet (0.4 mg total) under the tongue every 5 (five) minutes as needed for chest pain. one tab every 5 minutes up to 3 tablets total over 15 minutes. 25 tablet 3   ondansetron (ZOFRAN) 4 MG tablet Take 1 tablet (4 mg total) by mouth 2 (two) times daily as needed for nausea or vomiting. 60 tablet 1   rosuvastatin (CRESTOR) 20 MG tablet Take 1 tablet (20 mg total) by mouth daily. 90 tablet 3   sertraline (ZOLOFT) 50 MG tablet TAKE 1 TABLET(50 MG) BY MOUTH DAILY 90 tablet 1   topiramate (TOPAMAX) 25 MG tablet Take 1 tablet (25 mg total) by mouth at bedtime. 90 tablet 3    ROS:                                                                                                                                       History obtained from chart review and the patient  General ROS: negative for - chills, fatigue, fever, night sweats, weight gain or weight loss Psychological ROS: negative for - behavioral disorder, hallucinations, memory difficulties, mood swings or suicidal ideation Ophthalmic ROS: negative for - blurry vision, double vision, eye pain or loss of vision ENT ROS: negative for - epistaxis, nasal discharge, oral lesions, sore throat, tinnitus or vertigo Allergy and Immunology ROS: negative for - hives or itchy/watery eyes Hematological and Lymphatic ROS: negative for - bleeding problems, bruising or swollen lymph nodes Endocrine ROS: negative for - galactorrhea, hair pattern changes, polydipsia/polyuria or temperature intolerance Respiratory ROS: negative for - cough, hemoptysis, shortness of breath or wheezing Cardiovascular ROS: negative for - chest pain, dyspnea on exertion, edema or irregular heartbeat Gastrointestinal ROS: negative for - abdominal pain, diarrhea, hematemesis, nausea/vomiting or stool incontinence Genito-Urinary ROS: negative for - dysuria, hematuria, incontinence or urinary  frequency/urgency Musculoskeletal ROS: negative for - joint swelling or muscular weakness Neurological ROS: as noted in HPI. + for headache. Dermatological ROS: negative for rash and skin lesion changes   Blood pressure (!) 195/72, pulse 72, temperature 98.5 F (36.9 C), temperature source Oral, resp. rate 18, height 5' (1.524 m), weight 51 kg, SpO2 98%.   General Examination:  Physical Exam  GENERAL: Awake, alert in NAD HEENT: - Normocephalic and atraumatic, dry mm LUNGS - Unlabored, on room air CV - S1S2 RRR, no m/r/g ABDOMEN - Soft, nontender, non-distended Ext: warm, well perfused, intact peripheral pulses, no edema   NEURO:  MENTAL STATUS: A&Ox4, states name, month, location and reason for hospital stay LANG/SPEECH: Speech is mildly dysarthric (baseline per patient). Naming and repetition intact, fluent, follows 3-step commands.? CRANIAL NERVES:? II: Pupils equal and reactive, no RAPD, no VF deficits? III, IV, VI: EOM intact, no gaze preference or deviation, no nystagmus.? V: normal sensation in V1, V2, and V3 segments bilaterally? VII: nasolabial fold flattening on the left with slight droop VIII: normal hearing to speech? IX, X: normal palatal elevation, no uvular deviation? XI: 5/5 head turn and 5/5 shoulder shrug bilaterally? XII: midline tongue protrusion? MOTOR:? 4+/5 R shoulder, elbow and 5/5 R wrist, grips.   4/5 in R hip, knee, ankle dorsiflexors and planter flexors. 5/5 L elbow flexors/extensors, wrist, grips.   5/5 in L hip, knee, ankle dorsiflexors and planter flexors. REFLEXES: 2/4 throughout, bilateral flexor plantar response. ? SENSORY:?Normal to touch.?No extinction to double sided stimulation (visual & tactile)? COORD: Slow but intact FNF and heel-to-shin, no ataxia GAIT: Deferred   Lab Results: Basic Metabolic Panel: Recent Labs  Lab 10/11/22 1902  10/12/22 0613  NA 138 140  K 3.5 2.9*  CL 106 110  CO2 23 24  GLUCOSE 459* 184*  BUN 15 10  CREATININE 1.01* 0.97  CALCIUM 9.0 9.1  MG  --  1.9  PHOS  --  3.9    CBC: Recent Labs  Lab 10/11/22 1902 10/12/22 0613  WBC 9.1 8.5  NEUTROABS 6.0  --   HGB 10.7* 11.0*  HCT 32.3* 33.1*  MCV 93.1 91.4  PLT 231 318    Cardiac Enzymes: No results for input(s): "CKTOTAL", "CKMB", "CKMBINDEX", "TROPONINI" in the last 168 hours.  Lipid Panel: No results for input(s): "CHOL", "TRIG", "HDL", "CHOLHDL", "VLDL", "LDLCALC" in the last 168 hours.  Imaging: MR BRAIN WO CONTRAST  Result Date: 10/12/2022 CLINICAL DATA:  Stroke-like symptoms with right-sided weakness EXAM: MRI HEAD WITHOUT CONTRAST TECHNIQUE: Multiplanar, multiecho pulse sequences of the brain and surrounding structures were obtained without intravenous contrast. COMPARISON:  Head CT from yesterday and brain MRI 10/24/2021 FINDINGS: Brain: Small scattered acute infarcts along the posterior left frontal cortex/white matter in the left occipital cortex, and in the lower left cerebellum. Extensive chronic infarction in the right superficial temporal, parietal, and right frontal cortex. Ischemic gliosis is widespread throughout the cerebral white matter. Small chronic bilateral cerebellar infarcts. Areas of microhemorrhage associated with the cortex of prior infarctions, greatest at the right parietal lobe. Cortical laminar necrosis with T1 hyperintensity especially affecting the posterior division of the right MCA territory. Cerebral volume loss that is generalized. No collection, hydrocephalus, or mass. Vascular: Major flow voids are preserved. Skull and upper cervical spine: Normal marrow signal Sinuses/Orbits: Negative IMPRESSION: 1. Small acute infarcts scattered in the left cerebral hemisphere and left cerebellum. 2. Extensive chronic small vessel disease and right cerebral cortex infarcts. Electronically Signed   By: Tiburcio Pea  M.D.   On: 10/12/2022 05:24   CT HEAD WO CONTRAST  Result Date: 10/11/2022 CLINICAL DATA:  Multiple recent falls for, initial encounter EXAM: CT HEAD WITHOUT CONTRAST TECHNIQUE: Contiguous axial images were obtained from the base of the skull through the vertex without intravenous contrast. RADIATION DOSE REDUCTION: This exam was performed according to the departmental dose-optimization  program which includes automated exposure control, adjustment of the mA and/or kV according to patient size and/or use of iterative reconstruction technique. COMPARISON:  10/23/2021 FINDINGS: Brain: No evidence of acute infarction, hemorrhage, hydrocephalus, extra-axial collection or mass lesion/mass effect. Changes of prior right temporoparietal infarct are seen and stable. Mild atrophic changes and chronic white matter ischemic changes are seen. Vascular: No hyperdense vessel or unexpected calcification. Skull: Normal. Negative for fracture or focal lesion. Sinuses/Orbits: No acute finding. Other: None. IMPRESSION: Chronic atrophic and ischemic changes.  No acute abnormality noted. Electronically Signed   By: Alcide Clever M.D.   On: 10/11/2022 21:45     Assessment: 77 y.o. female with a PMHx of glaucoma, bilateral carpal tunnel syndrome, HTN, HLD, CAD s/p stent placement, Plavix at home, current loop recorder in place, adenomatous colonic polyps, large prior right MCA stroke with residual slight dysarthria and left facial droop who presented with new right-sided arm and leg weakness. MRI positive for multiple acute infarcts in the left cerebral hemisphere and cerebellum.  - Exam reveals mild right sided weakness, mild dysarthria and mild LEFT facial droop - MRI brain: Small acute infarcts scattered in the left cerebral hemisphere and left cerebellum. Extensive chronic small vessel disease and extensive chronic right cerebral cortex infarcts. - Stroke risk factors: HTN, HLD, CAD, prior stroke, DM and semicompliance with  home Plavix - CTA of head and neck: No emergent finding. Atherosclerosis with moderate to advanced bilateral MCA stenosis, worse on the right where there is chronic inferior division infarction. No flow reducing stenosis or embolic source seen in the neck.  - Prior imaging studies: - Most recent TTE on file from 10/24/21: LVEF 60-65% with no regional wall motion abnormalities. No intracardiac source of embolism detected.  - Prior carotid ultrasound from 10/24/21: Velocities in the left and right ICAs were consistent with a 1-39% stenosis bilaterally. Right vertebral artery demonstrated antegrade flow. Left vertebral exhibited atypical waveform. Abnormal waveforms in bilateral subclavians.  - EKG: Sinus rhythm; borderline repolarization abnormality - HgbA1c is elevated at 12.2 - Given that her acute strokes have occurred in separate vascular territories, a cardioembolic etiology is suspected.   Recommendations: - Frequent Neuro checks per stroke unit protocol - Consider obtaining a TEE. Last TTE was obtained within the last 12 months with no intracardiac source of embolism detected.  - Lipid panel - Continue rosuvastatin. Obtain baseline CK level.  - Antithrombotic - ASA and Plavix for 3 weeks, then Plavix alone. Was semicompliant with Plavix prior to admission, therefore escalation to long-term DAPT therapy is not indicated.  - Continue to educate patient on medication compliance as she admits to not taking Plavix every day.  - DVT ppx - lovenox - Smoking cessation - will counsel patient - BP management per standard protocol. Out of the permissive HTN time window.  - Telemetry monitoring for arrhythmia - 72h - Loop recorder interrogation - Swallow screen - will be performed prior to PO intake - Stroke education - will be given - PT/OT/Speech - Stroke Team to follow in the AM  Electronically signed: Dr. Caryl Pina 10/12/2022, 7:08 AM

## 2022-10-12 NOTE — Plan of Care (Signed)
  Problem: Education: Goal: Knowledge of disease or condition will improve Outcome: Progressing Goal: Knowledge of secondary prevention will improve (MUST DOCUMENT ALL) Outcome: Progressing Goal: Knowledge of patient specific risk factors will improve Loraine Leriche N/A or DELETE if not current risk factor) Outcome: Progressing   Problem: Ischemic Stroke/TIA Tissue Perfusion: Goal: Complications of ischemic stroke/TIA will be minimized Outcome: Progressing   Problem: Coping: Goal: Will verbalize positive feelings about self Outcome: Progressing Goal: Will identify appropriate support needs Outcome: Progressing   Problem: Health Behavior/Discharge Planning: Goal: Ability to manage health-related needs will improve Outcome: Progressing   Problem: Self-Care: Goal: Ability to participate in self-care as condition permits will improve Outcome: Progressing Goal: Verbalization of feelings and concerns over difficulty with self-care will improve Outcome: Progressing Goal: Ability to communicate needs accurately will improve Outcome: Progressing   Problem: Activity: Goal: Risk for activity intolerance will decrease Outcome: Progressing   Problem: Pain Managment: Goal: General experience of comfort will improve Outcome: Progressing   Problem: Safety: Goal: Ability to remain free from injury will improve Outcome: Progressing

## 2022-10-12 NOTE — Evaluation (Signed)
Physical Therapy Evaluation Patient Details Name: Sarah [name] MRN: [id number] DOB: [birth year] Today's Date: 10/12/2022  History of Present Illness  77yo female who presented to the ED 10/12/22 with 2 days of R LE weakness and falls. MRI with small scattered acute infarcts in posterior L frontal cortex/white matter int eh L occipital cortex and in lower L cerebellum. PMH B carpal tunnel syndrome, glaucoma, HTN, HLD, osteoporosis, CVA, DM, loop recorder insertion, hx ankle fx s/p ORIF  Clinical Impression   Pt received in bed, cooperative with skilled rehab services today. Generally able to mobilize on a MinA basis with RW however very easily fatigued and would certainly be a high fall risk without BUE support as well as external assist for balance. Dizzy during session- possibly from cerebellar involvement with new infarcts. Does have poor safety awareness as well as poor insight into deficits, but has good support from family. Will definitely benefit from skilled PT f/u after DC from the hospital.         If plan is discharge home, recommend the following: A little help with walking and/or transfers;Direct supervision/assist for medications management;Supervision due to cognitive status;Direct supervision/assist for financial management;Assistance with cooking/housework;Assist for transportation;A little help with bathing/dressing/bathroom;Help with stairs or ramp for entrance   Can travel by private vehicle        Equipment Recommendations Rolling walker (2 wheels)  Recommendations for Other Services       Functional Status Assessment Patient has had a recent decline in their functional status and demonstrates the ability to make significant improvements in function in a reasonable and predictable amount of time.     Precautions / Restrictions Precautions Precautions: Fall Restrictions Weight Bearing Restrictions: No      Mobility  Bed Mobility Overal bed mobility: Needs  Assistance Bed Mobility: Supine to Sit, Sit to Supine     Supine to sit: Supervision, HOB elevated Sit to supine: Min assist   General bed mobility comments: MinA with return to bed for LE management    Transfers Overall transfer level: Needs assistance Equipment used: Rolling walker (2 wheels) Transfers: Sit to/from Stand Sit to Stand: Min assist           General transfer comment: MinA to boost to full upright position and gain balance    Ambulation/Gait Ambulation/Gait assistance: Min assist Gait Distance (Feet): 18 Feet Assistive device: Rolling walker (2 wheels) Gait Pattern/deviations: Step-through pattern, Decreased step length - right, Decreased step length - left, Trunk flexed, Drifts right/left Gait velocity: decreased     General Gait Details: light MinA for steadying with RW, very easily fatigued and gait speed progressive declined with further gait distances  Careers information officer     Tilt Bed    Modified Rankin (Stroke Patients Only)       Balance Overall balance assessment: History of Falls, Needs assistance Sitting-balance support: Bilateral upper extremity supported, Feet supported Sitting balance-Leahy Scale: Fair     Standing balance support: Bilateral upper extremity supported, During functional activity, Reliant on assistive device for balance Standing balance-Leahy Scale: Poor                               Pertinent Vitals/Pain Pain Assessment Pain Assessment: No/denies pain    Home Living Family/patient expects to be discharged to:: Private residence Living Arrangements: Other relatives;Parent Available Help at Discharge: Personal care attendant;Other (Comment) (  aide 10-4 every day; brother and his wife life the same area and can come by, has another sister that can come by) Type of Home: House Home Access: Stairs to enter Entrance Stairs-Rails: None Entrance Stairs-Number of Steps: 1  STE Alternate Level Stairs-Number of Steps: 10 Home Layout: Two level Home Equipment: Cane - single point      Prior Function               Mobility Comments: multiple falls- 2 on Sunday, fall outside the home as well       Extremity/Trunk Assessment   Upper Extremity Assessment Upper Extremity Assessment: Defer to OT evaluation    Lower Extremity Assessment Lower Extremity Assessment: Generalized weakness    Cervical / Trunk Assessment Cervical / Trunk Assessment: Kyphotic  Communication   Communication Communication: No apparent difficulties Cueing Techniques: Verbal cues  Cognition Arousal: Alert   Overall Cognitive Status: History of cognitive impairments - at baseline                                          General Comments      Exercises     Assessment/Plan    PT Assessment Patient needs continued PT services  PT Problem List Decreased strength;Decreased cognition;Decreased knowledge of use of DME;Decreased activity tolerance;Decreased safety awareness;Decreased balance;Decreased mobility;Decreased coordination       PT Treatment Interventions DME instruction;Gait training;Patient/family education;Stair training;Functional mobility training;Therapeutic activities;Therapeutic exercise;Balance training;Neuromuscular re-education    PT Goals (Current goals can be found in the Care Plan section)  Acute Rehab PT Goals Patient Stated Goal: go home PT Goal Formulation: With patient Time For Goal Achievement: 10/26/22 Potential to Achieve Goals: Good    Frequency Min 1X/week     Co-evaluation               AM-PAC PT "6 Clicks" Mobility  Outcome Measure Help needed turning from your back to your side while in a flat bed without using bedrails?: A Little Help needed moving from lying on your back to sitting on the side of a flat bed without using bedrails?: A Little Help needed moving to and from a bed to a chair (including a  wheelchair)?: A Little Help needed standing up from a chair using your arms (e.g., wheelchair or bedside chair)?: A Little Help needed to walk in hospital room?: A Little Help needed climbing 3-5 steps with a railing? : A Lot 6 Click Score: 17    End of Session Equipment Utilized During Treatment: Gait belt Activity Tolerance: Patient tolerated treatment well Patient left: in bed;with call bell/phone within reach;with bed alarm set;with family/visitor present Nurse Communication: Mobility status PT Visit Diagnosis: Unsteadiness on feet (R26.81);Difficulty in walking, not elsewhere classified (R26.2);Muscle weakness (generalized) (M62.81);History of falling (Z91.81)    Time: 0960-4540 PT Time Calculation (min) (ACUTE ONLY): 32 min   Charges:   PT Evaluation $PT Eval Moderate Complexity: 1 Mod (co-eval with OT)   PT General Charges $$ ACUTE PT VISIT: 1 Visit        Nedra Hai, PT, DPT 10/12/22 12:45 PM

## 2022-10-12 NOTE — Evaluation (Signed)
Speech Language Pathology Evaluation Patient Details Name: Sarah [name] MRN: [id number] DOB: 11-Oct-1945 Today's Date: 10/12/2022 Time: 4098-1191 SLP Time Calculation (min) (ACUTE ONLY): 20 min  Problem List:  Patient Active Problem List   Diagnosis Date Noted   Generalized weakness 10/12/2022   Stroke-like symptoms 10/11/2022   Urge urinary incontinence 07/02/2022   Seborrheic keratosis 07/02/2022   Pain of right lower extremity 12/11/2021   Nausea and vomiting in adult 12/11/2021   Elevated troponin    Change in mental status 10/24/2021   Long term current use of antithrombotics/antiplatelets 10/02/2021   Heart murmur, systolic 04/08/2021   Dizziness 04/08/2021   Long term (current) use of antithrombotics/antiplatelets 12/16/2020   Chronic constipation 12/16/2020   Colon cancer screening 12/16/2020   History of adenomatous polyp of colon 12/16/2020   Coronary artery disease of native artery of native heart with stable angina pectoris (HCC) 03/04/2020   Diabetes mellitus (HCC) 03/03/2020   Aortic atherosclerosis (HCC) 02/12/2020   Bilateral leg cramps 01/21/2020   Bilateral lower extremity edema 01/21/2020   Anxiety disorder 01/21/2020   Type 2 diabetes mellitus with hyperglycemia, with long-term current use of insulin (HCC) 08/15/2019   Type 2 diabetes mellitus with diabetic polyneuropathy, with long-term current use of insulin (HCC) 08/15/2019   Uncontrolled type 2 diabetes mellitus with hypoglycemia without coma (HCC) 08/15/2019   Hyperlipidemia LDL goal <70 08/15/2019   Bilateral carpal tunnel syndrome 03/27/2019   Osteoarthritis of both knees 11/27/2018   CKD (chronic kidney disease), stage II 11/27/2018   GERD (gastroesophageal reflux disease) 06/26/2018   Homonymous hemianopia, left 02/08/2018   Cerebrovascular accident (CVA) due to embolism of cerebral artery (HCC)    Diabetes mellitus with coincident hypertension (HCC)    Trigger finger, right index finger  10/17/2017   Low back pain 07/29/2017   Carotid artery stenosis 11/02/2016   Left arm weakness    Diastolic dysfunction    Hypertension with heart disease    Acute blood loss anemia    Cerebral infarction due to embolism of right middle cerebral artery (HCC)    Perirectal abscess 09/26/2015   Vitamin D deficiency 07/23/2015   Osteopenia 02/16/2013   Ankle fracture 01/19/2013   Anemia 09/27/2011   Type II diabetes mellitus with nephropathy (HCC) 09/24/2011   Mixed hyperlipidemia 09/24/2011   Primary hypertension 09/24/2011   Past Medical History:  Past Medical History:  Diagnosis Date   Bilateral carpal tunnel syndrome 03/27/2019   Boils    Glaucoma    Heart murmur    HTN (hypertension)    Hx of adenomatous colonic polyps    Hyperlipidemia    Osteoporosis    Pneumonia    Stroke (HCC)    Type II or unspecified type diabetes mellitus without mention of complication, uncontrolled    Past Surgical History:  Past Surgical History:  Procedure Laterality Date   ABDOMINAL HYSTERECTOMY     CARPAL TUNNEL RELEASE     right   COLONOSCOPY  12-10-10   per Dr. Arlyce Dice, clear, repeat in 7 yrs    CORONARY STENT INTERVENTION N/A 03/11/2020   Procedure: CORONARY STENT INTERVENTION;  Surgeon: Corky Crafts, MD;  Location: [facility] INVASIVE CV LAB;  Service: Cardiovascular;  Laterality: N/A;   CORONARY ULTRASOUND/IVUS N/A 03/11/2020   Procedure: Intravascular Ultrasound/IVUS;  Surgeon: Corky Crafts, MD;  Location: [facility] INVASIVE CV LAB;  Service: Cardiovascular;  Laterality: N/A;   INCISION AND DRAINAGE PERIRECTAL ABSCESS N/A 09/26/2015   Procedure: IRRIGATION AND DEBRIDEMENT PERIRECTAL ABSCESS;  Surgeon:  Rodman Pickle, MD;  Location: [facility] OR;  Service: General;  Laterality: N/A;   KNEE ARTHROSCOPY     right   LEFT HEART CATH AND CORONARY ANGIOGRAPHY N/A 03/11/2020   Procedure: LEFT HEART CATH AND CORONARY ANGIOGRAPHY;  Surgeon: Corky Crafts, MD;  Location: [facility] INVASIVE CV LAB;   Service: Cardiovascular;  Laterality: N/A;   LOOP RECORDER INSERTION N/A 10/19/2016   Procedure: LOOP RECORDER INSERTION;  Surgeon: Hillis Range, MD;  Location: [facility] INVASIVE CV LAB;  Service: Cardiovascular;  Laterality: N/A;   ORIF ANKLE FRACTURE Right 03/24/2012   Procedure: OPEN REDUCTION INTERNAL FIXATION (ORIF) ANKLE FRACTURE;  Surgeon: Nadara Mustard, MD;  Location: [facility] OR;  Service: Orthopedics;  Laterality: Right;  Open Reduction Internal Fixation Right Bimalleolar ankle fracture   POLYPECTOMY     TEE WITHOUT CARDIOVERSION N/A 10/18/2016   Procedure: TRANSESOPHAGEAL ECHOCARDIOGRAM (TEE);  Surgeon: Pricilla Riffle, MD;  Location: [facility] ENDOSCOPY;  Service: Cardiovascular;  Laterality: N/A;   TONSILLECTOMY     HPI:  77 y.o. female with hx prior CVA with residual left-sided hemiparesis, hypertension, hyperlipidemia, diabetes, CAD, reports that she currently has a loop recorder, [facility] of primarily right leg weakness and falls.   MRI with small scattered acute infarcts along the posterior left frontal cortex/white matter in the left occipital cortex, and in the lower left cerebellum.   Assessment / Plan / Recommendation Clinical Impression  Pt demonstrates no acute cognitive impiarment; she does confirm her cognition and speech have been impacted by prior CVA. Today pt was able to sustain attention, complete basic memory and reasoning tasks with extra time and effort. She has good anticipatory awareness; relies on sister to remotely manage her medical care and has a personal attendant to make meals, make sure her meds are taken correctly. Pt feels capable of returning home with these supports already in place for her and her mother. No SLP f/u needed. Will sign off.    SLP Assessment  SLP Recommendation/Assessment: Patient does not need any further Speech Lanaguage Pathology Services    Recommendations for follow up therapy are one component of a  multi-disciplinary discharge planning process, led by the attending physician.  Recommendations may be updated based on patient status, additional functional criteria and insurance authorization.    Follow Up Recommendations       Assistance Recommended at Discharge     Functional Status Assessment    Frequency and Duration           SLP Evaluation Cognition  Overall Cognitive Status: History of cognitive impairments - at baseline Arousal/Alertness: Awake/alert Orientation Level: Oriented X4 Attention: Sustained Sustained Attention: Appears intact Memory: Appears intact Awareness: Impaired Awareness Impairment: Emergent impairment Problem Solving: Impaired Problem Solving Impairment: Verbal complex Safety/Judgment: Appears intact       Comprehension  Auditory Comprehension Overall Auditory Comprehension: Appears within functional limits for tasks assessed    Expression Verbal Expression Overall Verbal Expression: Appears within functional limits for tasks assessed   Oral / Motor  Oral Motor/Sensory Function Overall Oral Motor/Sensory Function: Within functional limits Motor Speech Overall Motor Speech: Appears within functional limits for tasks assessed            Parvin Stetzer, Riley Nearing 10/12/2022, 11:30 AM

## 2022-10-12 NOTE — TOC Initial Note (Addendum)
Transition of Care [facility]) - Initial/Assessment Note    Patient Details  Name: Sarah [name] MRN: [id number] Date of Birth: November 17, 1945  [facility] CM/SW Contact:    Kermit Balo, RN Phone Number: 10/12/2022, 2:44 PM  Clinical Narrative:                 Pt is from home with her mother. They have an aide 8 hours a day 7 days a week. Pts sisters rotate staying on the weekends.  Sisters set up her pill box and aide reminds her to take her medications.  Sisters or [employer] provide needed transportation. PT/OT recommending home health services. Pt without a preference after provided choice under [email or web address]. CM has arranged home health with [facility]. Information on the AVS. [name] will contact her for the first home visit. Sisters will provide transport home when she is medically ready. TOC following.  Expected Discharge Plan: Home w Home Health Services Barriers to Discharge: Continued Medical Work up   Patient Goals and CMS Choice   CMS Medicare.gov Compare Post Acute Care list provided to:: Patient Choice offered to / list presented to : Patient      Expected Discharge Plan and Services   Discharge Planning Services: CM Consult Post Acute Care Choice: Home Health Living arrangements for the past 2 months: Single Family Home                           HH Arranged: PT, OT HH Agency: [facility] Date [facility] Agency Contacted: 10/12/22   Representative spoke with at [facility] Agency: Kandee Keen  Prior Living Arrangements/Services Living arrangements for the past 2 months: Single Family Home Lives with:: Parents Patient language and need for interpreter reviewed:: Yes Do you feel safe going back to the place where you live?: Yes          Current home services: DME (cane/ walker/ heart monitor) Criminal Activity/Legal Involvement Pertinent to Current Situation/Hospitalization: No - Comment as needed  Activities of Daily Living   ADL Screening (condition at  time of admission) Does the patient have a NEW difficulty with bathing/dressing/toileting/self-feeding that is expected to last >3 days?: No Does the patient have a NEW difficulty with getting in/out of bed, walking, or climbing stairs that is expected to last >3 days?: No Does the patient have a NEW difficulty with communication that is expected to last >3 days?: No Is the patient deaf or have difficulty hearing?: No Does the patient have difficulty seeing, even when wearing glasses/contacts?: No Does the patient have difficulty concentrating, remembering, or making decisions?: No  Permission Sought/Granted                  Emotional Assessment Appearance:: Appears stated age Attitude/Demeanor/Rapport: Engaged Affect (typically observed): Accepting Orientation: : Oriented to Self, Oriented to Place, Oriented to  Time, Oriented to Situation   Psych Involvement: No (comment)  Admission diagnosis:  Stroke-like symptoms [R29.90] Generalized weakness [R53.1] Patient Active Problem List   Diagnosis Date Noted   Generalized weakness 10/12/2022   Stroke-like symptoms 10/11/2022   Urge urinary incontinence 07/02/2022   Seborrheic keratosis 07/02/2022   Pain of right lower extremity 12/11/2021   Nausea and vomiting in adult 12/11/2021   Elevated troponin    Change in mental status 10/24/2021   Long term current use of antithrombotics/antiplatelets 10/02/2021   Heart murmur, systolic 04/08/2021   Dizziness 04/08/2021   Long term (current) use of  antithrombotics/antiplatelets 12/16/2020   Chronic constipation 12/16/2020   Colon cancer screening 12/16/2020   History of adenomatous polyp of colon 12/16/2020   Coronary artery disease of native artery of native heart with stable angina pectoris (HCC) 03/04/2020   Diabetes mellitus (HCC) 03/03/2020   Aortic atherosclerosis (HCC) 02/12/2020   Bilateral leg cramps 01/21/2020   Bilateral lower extremity edema 01/21/2020   Anxiety  disorder 01/21/2020   Type 2 diabetes mellitus with hyperglycemia, with long-term current use of insulin (HCC) 08/15/2019   Type 2 diabetes mellitus with diabetic polyneuropathy, with long-term current use of insulin (HCC) 08/15/2019   Uncontrolled type 2 diabetes mellitus with hypoglycemia without coma (HCC) 08/15/2019   Hyperlipidemia LDL goal <70 08/15/2019   Bilateral carpal tunnel syndrome 03/27/2019   Osteoarthritis of both knees 11/27/2018   CKD (chronic kidney disease), stage II 11/27/2018   GERD (gastroesophageal reflux disease) 06/26/2018   Homonymous hemianopia, left 02/08/2018   Cerebrovascular accident (CVA) due to embolism of cerebral artery (HCC)    Diabetes mellitus with coincident hypertension (HCC)    Trigger finger, right index finger 10/17/2017   Low back pain 07/29/2017   Carotid artery stenosis 11/02/2016   Left arm weakness    Diastolic dysfunction    Hypertension with heart disease    Acute blood loss anemia    Cerebral infarction due to embolism of right middle cerebral artery (HCC)    Perirectal abscess 09/26/2015   Vitamin D deficiency 07/23/2015   Osteopenia 02/16/2013   Ankle fracture 01/19/2013   Anemia 09/27/2011   Type II diabetes mellitus with nephropathy (HCC) 09/24/2011   Mixed hyperlipidemia 09/24/2011   Primary hypertension 09/24/2011   PCP:  [place], [name], MD Pharmacy:   [facility] - [place], [place] - [address] [place] [address] Phone: [phone] Fax: [phone]  [id number] - [place], [place] - [address] [place] [address] Phone: [phone] Fax: [phone]     Social Determinants of Health (SDOH) Social History: SDOH Screenings   Food Insecurity: No Food Insecurity (10/12/2022)  Housing: Low Risk  (10/12/2022)  Transportation Needs: No Transportation Needs (10/12/2022)  Utilities: Not At Risk  (10/12/2022)  Alcohol Screen: Low Risk  (05/03/2022)  Depression (PHQ2-9): Low Risk  (05/03/2022)  Financial Resource Strain: Low Risk  (05/03/2022)  Physical Activity: Insufficiently Active (05/03/2022)  Social Connections: Moderately Integrated (05/03/2022)  Stress: No Stress Concern Present (05/03/2022)  Tobacco Use: Medium Risk (10/11/2022)   SDOH Interventions:     Readmission Risk Interventions     No data to display

## 2022-10-12 NOTE — H&P (Signed)
History and Physical    Sarah [name] STATES ZOX:[id number] DOB: April 13, 1945 DOA: 10/11/2022  PCP: [place], [name], MD   Patient coming from:  Transfer [facility] ED    Chief Complaint:  Chief Complaint  Patient presents with   Weakness    HPI:  Sarah [name] is a 77 y.o. female with hx of prior CVA with residual left-sided hemiparesis, hypertension, hyperlipidemia, diabetes, CAD, reports that she currently has a loop recorder, who [facility] with 2 days of primarily right leg weakness and falls.  Last known well 9/29 approximately 5 PM reports on onset of right leg weakness at that time.  Fell twice on Sunday, reports additional falls since then but did not clarify when her last fall was.  Not on anticoagulation but is on Plavix for history of prior stroke.  Other than her right leg weakness, denies speech changes (although sounds dysarthric on my exam), vision changes, or other new numbness or weakness.  No headaches.  Otherwise has been in normal state of health with no complaints.   Review of Systems:  ROS complete and negative except as marked above   No Known Allergies  Prior to Admission medications   Medication Sig Start Date End Date Taking? Authorizing Provider  amLODipine (NORVASC) 5 MG tablet Take 1 tablet (5 mg total) by mouth daily. 02/16/22   [place], [name], MD  cholecalciferol (VITAMIN D3) 25 MCG (1000 UNIT) tablet Take 1,000 Units by mouth daily.    [provider]  clopidogrel (PLAVIX) 75 MG tablet TAKE 1 TABLET BY MOUTH DAILY 08/24/22   [place], [name], MD  Continuous Glucose Receiver (DEXCOM G7 RECEIVER) DEVI USE TO MONITOR GLUCOSE 08/24/22   Shamleffer, Konrad Dolores, MD  Continuous Glucose Sensor (DEXCOM G7 SENSOR) MISC 1 Device by Does not apply route as directed. 05/10/22   Shamleffer, Konrad Dolores, MD  dapagliflozin propanediol (FARXIGA) 10 MG TABS tablet Take 1 tablet (10 mg total) by mouth daily. 05/10/22    Shamleffer, Konrad Dolores, MD  ezetimibe (ZETIA) 10 MG tablet Take 1 tablet (10 mg total) by mouth daily. 08/24/22   [place], [name], MD  furosemide (LASIX) 20 MG tablet Take 1 tablet by mouth daily (only as needed) 12/08/21   Dyann Kief, PA-C  hydrALAZINE (APRESOLINE) 10 MG tablet Take 2 tablets (20 mg total) by mouth 3 (three) times daily. 02/16/22   [place], [name], MD  insulin glargine, 1 Unit Dial, (TOUJEO SOLOSTAR) 300 UNIT/ML Solostar Pen Inject 14 Units into the skin daily in the afternoon. 05/10/22   Shamleffer, Konrad Dolores, MD  insulin lispro (HUMALOG KWIKPEN) 100 UNIT/ML KwikPen Max daily 30 units 05/10/22   Shamleffer, Konrad Dolores, MD  Insulin Pen Needle 32G X 4 MM MISC 1 Device by Does not apply route in the morning, at noon, in the evening, and at bedtime. 05/10/22   Shamleffer, Konrad Dolores, MD  latanoprost (XALATAN) 0.005 % ophthalmic solution Place 1 drop into both eyes at bedtime.    [provider]  liraglutide (VICTOZA) 18 MG/3ML SOPN Inject 1.2 mg into the skin daily. 05/10/22   Shamleffer, Konrad Dolores, MD  losartan (COZAAR) 50 MG tablet TAKE 1 TABLET BY MOUTH DAILY 08/24/22   [place], [name], MD  Magnesium 250 MG TABS Take 250 mg by mouth daily.    [provider]  nitroGLYCERIN (NITROSTAT) 0.4 MG SL tablet Place 1 tablet (0.4 mg total) under the tongue every 5 (five) minutes as needed for  chest pain. one tab every 5 minutes up to 3 tablets total over 15 minutes. 02/29/20   Chandrasekhar, Lafayette Dragon A, MD  ondansetron (ZOFRAN) 4 MG tablet Take 1 tablet (4 mg total) by mouth 2 (two) times daily as needed for nausea or vomiting. 12/11/21   [place], [name], MD  rosuvastatin (CRESTOR) 20 MG tablet Take 1 tablet (20 mg total) by mouth daily. 04/11/22   [place], [name], MD  sertraline (ZOLOFT) 50 MG tablet TAKE 1 TABLET(50 MG) BY MOUTH DAILY 02/16/22   [place], [name], MD  topiramate (TOPAMAX) 25 MG tablet Take 1 tablet (25 mg total) by mouth at bedtime. 09/07/22    Ihor Austin, NP    Past Medical History:  Diagnosis Date   Bilateral carpal tunnel syndrome 03/27/2019   Boils    Glaucoma    Heart murmur    HTN (hypertension)    Hx of adenomatous colonic polyps    Hyperlipidemia    Osteoporosis    Pneumonia    Stroke (HCC)    Type II or unspecified type diabetes mellitus without mention of complication, uncontrolled     Past Surgical History:  Procedure Laterality Date   ABDOMINAL HYSTERECTOMY     CARPAL TUNNEL RELEASE     right   COLONOSCOPY  12-10-10   per Dr. Arlyce Dice, clear, repeat in 7 yrs    CORONARY STENT INTERVENTION N/A 03/11/2020   Procedure: CORONARY STENT INTERVENTION;  Surgeon: Corky Crafts, MD;  Location: [facility] INVASIVE CV LAB;  Service: Cardiovascular;  Laterality: N/A;   CORONARY ULTRASOUND/IVUS N/A 03/11/2020   Procedure: Intravascular Ultrasound/IVUS;  Surgeon: Corky Crafts, MD;  Location[facility] INVASIVE CV LAB;  Service: Cardiovascular;  Laterality: N/A;   INCISION AND DRAINAGE PERIRECTAL ABSCESS N/A 09/26/2015   Procedure: IRRIGATION AND DEBRIDEMENT PERIRECTAL ABSCESS;  Surgeon: Rodman Pickle, MD;  Location: [facility] OR;  Service: General;  Laterality: N/A;   KNEE ARTHROSCOPY     right   LEFT HEART CATH AND CORONARY ANGIOGRAPHY N/A 03/11/2020   Procedure: LEFT HEART CATH AND CORONARY ANGIOGRAPHY;  Surgeon: Corky Crafts, MD;  Location: [facility] INVASIVE CV LAB;  Service: Cardiovascular;  Laterality: N/A;   LOOP RECORDER INSERTION N/A 10/19/2016   Procedure: LOOP RECORDER INSERTION;  Surgeon: Hillis Range, MD;  Location: [facility] INVASIVE CV LAB;  Service: Cardiovascular;  Laterality: N/A;   ORIF ANKLE FRACTURE Right 03/24/2012   Procedure: OPEN REDUCTION INTERNAL FIXATION (ORIF) ANKLE FRACTURE;  Surgeon: Nadara Mustard, MD;  Location: [facility] OR;  Service: Orthopedics;  Laterality: Right;  Open Reduction Internal Fixation Right Bimalleolar ankle fracture   POLYPECTOMY     TEE WITHOUT CARDIOVERSION N/A 10/18/2016   Procedure:  TRANSESOPHAGEAL ECHOCARDIOGRAM (TEE);  Surgeon: Pricilla Riffle, MD;  Location: [facility] ENDOSCOPY;  Service: Cardiovascular;  Laterality: N/A;   TONSILLECTOMY       reports that she quit smoking about 5 years ago. Her smoking use included cigarettes. She has never used smokeless tobacco. She reports current alcohol use. She reports that she does not use drugs.  Family History  Problem Relation Age of Onset   Diabetes Mother    Hypertension Mother    Stroke Father    Stroke Maternal Uncle    Stroke Paternal Uncle    Colon cancer Neg Hx    Esophageal cancer Neg Hx    Rectal cancer Neg Hx    Stomach cancer Neg Hx    Inflammatory bowel disease Neg Hx    Liver disease Neg Hx  Pancreatic cancer Neg Hx      Physical Exam: Vitals:   10/12/22 0046 10/12/22 0100 10/12/22 0105 10/12/22 0606  BP:  (!) 203/82 (!) 203/82 (!) 195/72  Pulse:  77 77 72  Resp:  16 19 18   Temp:  98.6 F (37 C) 98.3 F (36.8 C) 98.5 F (36.9 C)  TempSrc:  Oral  Oral  SpO2:  96% 98% 98%  Weight: 51 kg     Height: 5' (1.524 m)       Gen: Awake, alert, NAD  CV: Regular, normal S1, S2, 1/6 SEM Resp: Normal WOB, CTAB  Abd: Flat, normoactive, nontender MSK: Symmetric, no edema  Skin: No rashes or lesions to exposed skin  Neuro: Alert and interactive, fully oriented, speech is dysarthric.  No aphasia.  CN II through XII are intact.  Motor with 4/5 strength in the right upper and right lower extremity.  Sensation is intact and equal to fine touch.  Psych: euthymic, appropriate    Data review:   Labs reviewed, notable for:   Hyperglycemic in the 400s, improved Lipids and A1c pending  Micro:  Results for orders placed or performed during the hospital encounter of 10/23/21  Resp Panel by RT-PCR (Flu A&B, Covid) Anterior Nasal Swab     Status: None   Collection Time: 10/23/21  7:29 PM   Specimen: Anterior Nasal Swab  Result Value Ref Range Status   SARS Coronavirus 2 by RT PCR NEGATIVE NEGATIVE Final     Comment: (NOTE) SARS-CoV-2 target nucleic acids are NOT DETECTED.  The SARS-CoV-2 RNA is generally detectable in upper respiratory specimens during the acute phase of infection. The lowest concentration of SARS-CoV-2 viral copies this assay can detect is 138 copies/mL. A negative result does not preclude SARS-Cov-2 infection and should not be used as the sole basis for treatment or other patient management decisions. A negative result may occur with  improper specimen collection/handling, submission of specimen other than nasopharyngeal swab, presence of viral mutation(s) within the areas targeted by this assay, and inadequate number of viral copies(<138 copies/mL). A negative result must be combined with clinical observations, patient history, and epidemiological information. The expected result is Negative.  Fact Sheet for Patients:  BloggerCourse.com  Fact Sheet for Healthcare Providers:  SeriousBroker.it  This test is no t yet approved or cleared by the [place] FDA and  has been authorized for detection and/or diagnosis of SARS-CoV-2 by FDA under an Emergency Use Authorization (EUA). This EUA will remain  in effect (meaning this test can be used) for the duration of the COVID-19 declaration under Section 564(b)(1) of the Act, 21 U.S.C.section 360bbb-3(b)(1), unless the authorization is terminated  or revoked sooner.       Influenza A by PCR NEGATIVE NEGATIVE Final   Influenza B by PCR NEGATIVE NEGATIVE Final    Comment: (NOTE) The Xpert Xpress SARS-CoV-2/FLU/RSV plus assay is intended as an aid in the diagnosis of influenza from Nasopharyngeal swab specimens and should not be used as a sole basis for treatment. Nasal washings and aspirates are unacceptable for Xpert Xpress SARS-CoV-2/FLU/RSV testing.  Fact Sheet for Patients: BloggerCourse.com  Fact Sheet for Healthcare  Providers: SeriousBroker.it  This test is not yet approved or cleared by the [place] FDA and has been authorized for detection and/or diagnosis of SARS-CoV-2 by FDA under an Emergency Use Authorization (EUA). This EUA will remain in effect (meaning this test can be used) for the duration of the COVID-19 declaration under Section 564(b)(1) of  the Act, 21 U.S.C. section 360bbb-3(b)(1), unless the authorization is terminated or revoked.  Performed at [facility], [address], [place], [place] [address]    *Note: Due to a large number of results and/or encounters for the requested time period, some results have not been displayed. A complete set of results can be found in Results Review.    Imaging reviewed:  MR BRAIN WO CONTRAST  Result Date: 10/12/2022 CLINICAL DATA:  Stroke-like symptoms with right-sided weakness EXAM: MRI HEAD WITHOUT CONTRAST TECHNIQUE: Multiplanar, multiecho pulse sequences of the brain and surrounding structures were obtained without intravenous contrast. COMPARISON:  Head CT from yesterday and brain MRI 10/24/2021 FINDINGS: Brain: Small scattered acute infarcts along the posterior left frontal cortex/white matter in the left occipital cortex, and in the lower left cerebellum. Extensive chronic infarction in the right superficial temporal, parietal, and right frontal cortex. Ischemic gliosis is widespread throughout the cerebral white matter. Small chronic bilateral cerebellar infarcts. Areas of microhemorrhage associated with the cortex of prior infarctions, greatest at the right parietal lobe. Cortical laminar necrosis with T1 hyperintensity especially affecting the posterior division of the right MCA territory. Cerebral volume loss that is generalized. No collection, hydrocephalus, or mass. Vascular: Major flow voids are preserved. Skull and upper cervical spine: Normal marrow signal Sinuses/Orbits: Negative IMPRESSION: 1. Small  acute infarcts scattered in the left cerebral hemisphere and left cerebellum. 2. Extensive chronic small vessel disease and right cerebral cortex infarcts. Electronically Signed   By: Tiburcio Pea M.D.   On: 10/12/2022 05:24   CT HEAD WO CONTRAST  Result Date: 10/11/2022 CLINICAL DATA:  Multiple recent falls for, initial encounter EXAM: CT HEAD WITHOUT CONTRAST TECHNIQUE: Contiguous axial images were obtained from the base of the skull through the vertex without intravenous contrast. RADIATION DOSE REDUCTION: This exam was performed according to the departmental dose-optimization program which includes automated exposure control, adjustment of the mA and/or kV according to patient size and/or use of iterative reconstruction technique. COMPARISON:  10/23/2021 FINDINGS: Brain: No evidence of acute infarction, hemorrhage, hydrocephalus, extra-axial collection or mass lesion/mass effect. Changes of prior right temporoparietal infarct are seen and stable. Mild atrophic changes and chronic white matter ischemic changes are seen. Vascular: No hyperdense vessel or unexpected calcification. Skull: Normal. Negative for fracture or focal lesion. Sinuses/Orbits: No acute finding. Other: None. IMPRESSION: Chronic atrophic and ischemic changes.  No acute abnormality noted. Electronically Signed   By: Alcide Clever M.D.   On: 10/11/2022 21:45    EKG:  Sinus rhythm with repolarization changes, no acute ischemic changes.  [facility] ED Course:  [facility] with chronic old infarct.  No acute changes.  Her case was discussed with Dr. Jerrell Belfast who recommended transfer to [facility] for MRI and consultation if positive for stroke.   Assessment/Plan:  77 y.o. female with hx prior CVA with residual left-sided hemiparesis, hypertension, hyperlipidemia, diabetes, CAD, reports that she currently has a loop recorder, who [facility] with 2 days of primarily right leg  weakness and falls. Found to have acute multifocal left sided ischemic stroke.   Acute multifocal ischemic stroke  MRI with small scattered acute infarcts along the posterior left frontal cortex/white matter in the left occipital cortex, and in the lower left cerebellum.  -Neurology consulted, appreciate recommendations - CTA Head and neck  -Antiplatelet with plavix for now, pending vessel imaging   -Check lipids and A1c -Statin Continue Rosuvastatin 20 mg, Zetia   -  Telemonitoring (reports currently has loop recorder)  -TTE with bubble -PT/OT/SLP -DVT prophylaxis per below -Permissive HTN for 24 hr  - Swallow eval then [facility] diet   Ground level fall  I/s/o weakness , stroke per above.  - PT/OT eval per above   Chronic medical problems:  HTN: Hold her home amlodipine  HLD: statin per above   DM, with hyperglycemia: started on Semglee 10 units daily, and SSI.  CAD: antiplt / statin per above    Body mass index is 21.96 kg/m.    DVT prophylaxis:  SCDs Code Status:  Full Code Diet:  Diet Orders (From admission, onward)     Start     Ordered   10/12/22 0400  Diet Heart Room service appropriate? Yes; Fluid consistency: Thin  Diet effective now       Question Answer Comment  Room service appropriate? Yes   Fluid consistency: Thin      10/12/22 0401           Family Communication:  No   Consults:  Neurology   Admission status:   Inpatient, Telemetry bed  Severity of Illness: The appropriate patient status for this patient is INPATIENT. Inpatient status is judged to be reasonable and necessary in order to provide the required intensity of service to ensure the patient's safety. The patient's presenting symptoms, physical exam findings, and initial radiographic and laboratory data in the context of their chronic comorbidities is felt to place them at high risk for further clinical deterioration. Furthermore, it is not anticipated that the patient will be medically stable for  discharge from the hospital within 2 midnights of admission.   * I certify that at the point of admission it is my clinical judgment that the patient will require inpatient hospital care spanning beyond 2 midnights from the point of admission due to high intensity of service, high risk for further deterioration and high frequency of surveillance required.*   Dolly Rias, MD [facility]  How to contact the [facility] Attending or Consulting provider 7A - 7P or covering provider during after hours 7P -7A, for this patient.  Check the care team in [facility] and look for a) attending/consulting [facility] provider listed and b) the [facility] team listed Log into [email or web address] and use [facility]'s universal password to access. If you do not have the password, please contact the hospital operator. Locate the [facility] provider you are looking for under [facility] and page to a number that you can be directly reached. If you still have difficulty reaching the provider, please page the [facility] (Director on Call) for the Hospitalists listed on amion for assistance.  10/12/2022, 7:01 AM

## 2022-10-12 NOTE — Evaluation (Signed)
Occupational Therapy Evaluation Patient Details Name: Sarah [name] MRN: [id number] DOB: 1945-12-20 Today's Date: 10/12/2022   History of Present Illness Sarah [name] is a 77 y.o. female who presented after 2 days of RLE weakness and falls. MRI with small scattered acute infarcts along the posterior left frontal cortex/white matter in the left occipital cortex, and in the lower left cerebellum. PMHx: CVA with residual left-sided hemiparesis, hypertension, hyperlipidemia, diabetes, CAD   Clinical Impression   Sarah [name] was evaluated s/p the above admission list. She lives with her elderly mom at baseline, they have a PCG 6 hours/day and family rotates spending the night. Pt reports mod I ADLs, no AD use and a few recent falls. Upon evaluation the pt was limited by impaired cognition, weakness, unsteady gait with a few posterior LOBs, and poor activity tolerance. Overall she needed up to [facility] for transfers and cues for RW use and safety. Due to the deficits listed below the pt also needs up to min A for LB ADLs and set up A for UB ADLs with cues. Pt will benefit from continued acute OT services and HHOT.        If plan is discharge home, recommend the following: A little help with walking and/or transfers;A little help with bathing/dressing/bathroom;Assistance with cooking/housework;Direct supervision/assist for medications management;Direct supervision/assist for financial management;Assist for transportation;Help with stairs or ramp for entrance    Functional Status Assessment  Patient has had a recent decline in their functional status and demonstrates the ability to make significant improvements in function in a reasonable and predictable amount of time.        Precautions / Restrictions Precautions Precautions: Fall Restrictions Weight Bearing Restrictions: No      Mobility Bed Mobility Overal bed mobility: Needs Assistance Bed Mobility: Supine to Sit, Sit to Supine      Supine to sit: Supervision, HOB elevated Sit to supine: Min assist   General bed mobility comments: MinA with return to bed for LE management    Transfers Overall transfer level: Needs assistance Equipment used: Rolling walker (2 wheels) Transfers: Sit to/from Stand Sit to Stand: Min assist           General transfer comment: MinA to boost to full upright position and gain balance          ADL either performed or assessed with clinical judgement   ADL Overall ADL's : Needs assistance/impaired Eating/Feeding: Set up;Sitting   Grooming: Set up;Sitting   Upper Body Bathing: Set up;Sitting   Lower Body Bathing: Minimal assistance;Sit to/from stand   Upper Body Dressing : Set up;Sitting   Lower Body Dressing: Minimal assistance   Toilet Transfer: Contact guard assist;Ambulation;Regular Toilet   Toileting- Clothing Manipulation and Hygiene: Contact guard assist;Sitting/lateral lean       Functional mobility during ADLs: Minimal assistance;Rolling walker (2 wheels) General ADL Comments: cues for safety and RW management. Pt attempted to don LB clothing in standing     Vision Baseline Vision/History: 0 No visual deficits Vision Assessment?: No apparent visual deficits     Perception Perception: Not tested       Praxis Praxis: Not tested       Pertinent Vitals/Pain Pain Assessment Pain Assessment: No/denies pain     Extremity/Trunk Assessment Upper Extremity Assessment Upper Extremity Assessment: Generalized weakness;LUE deficits/detail LUE Deficits / Details: slowed and deliberate FM coordination   Lower Extremity Assessment Lower Extremity Assessment: Defer to PT evaluation   Cervical / Trunk Assessment Cervical / Trunk Assessment: Kyphotic  Communication Communication Communication: No apparent difficulties Cueing Techniques: Verbal cues   Cognition Arousal: Alert Behavior During Therapy: Flat affect Overall Cognitive Status:  Impaired/Different from baseline Area of Impairment: Attention, Following commands, Safety/judgement, Awareness, Problem solving                   Current Attention Level: Sustained   Following Commands: Follows one step commands consistently Safety/Judgement: Decreased awareness of safety, Decreased awareness of deficits Awareness: Emergent Problem Solving: Slow processing, Requires verbal cues General Comments: Limited insight to safety and deficits, needed frequent cues for RW management. Slowed processing     General Comments  VSS on RA            Home Living Family/patient expects to be discharged to:: Private residence Living Arrangements: Parent Available Help at Discharge: Personal care attendant;Other (Comment) Type of Home: House Home Access: Stairs to enter Entrance Stairs-Number of Steps: 1 STE Entrance Stairs-Rails: None Home Layout: Two level Alternate Level Stairs-Number of Steps: 10 Alternate Level Stairs-Rails: Can reach both Bathroom Shower/Tub: Producer, television/film/video: Handicapped height Bathroom Accessibility: Yes   Home Equipment: Cane - single point   Additional Comments: family takes turns spending the night  Lives With: Family    Prior Functioning/Environment Prior Level of Function : Independent/Modified Independent;Driving             Mobility Comments: multiple falls- 2 on Sunday, fall outside the home as well ADLs Comments: aide assists with IADLs, pt reports mod I ADLs, does not drive        OT Problem List: Decreased strength;Decreased range of motion;Decreased activity tolerance;Impaired balance (sitting and/or standing);Decreased safety awareness;Decreased knowledge of use of DME or AE;Decreased knowledge of precautions      OT Treatment/Interventions: Self-care/ADL training;Therapeutic exercise;DME and/or AE instruction;Therapeutic activities;Balance training;Patient/family education    OT Goals(Current goals  can be found in the care plan section) Acute Rehab OT Goals Patient Stated Goal: home OT Goal Formulation: With patient Time For Goal Achievement: 10/26/22 Potential to Achieve Goals: Good ADL Goals Pt Will Perform Grooming: with modified independence;standing Pt Will Perform Lower Body Dressing: with modified independence;sit to/from stand Pt Will Transfer to Toilet: with modified independence;ambulating Additional ADL Goal #1: Pt will navigate hospital environment with RW and mod I to demonstrate reduces risk of falls at dischrage  OT Frequency: Min 1X/week    Co-evaluation PT/OT/SLP Co-Evaluation/Treatment: Yes Reason for Co-Treatment: Complexity of the patient's impairments (multi-system involvement);For patient/therapist safety;To address functional/ADL transfers   OT goals addressed during session: ADL's and self-care      AM-PAC OT "6 Clicks" Daily Activity     Outcome Measure Help from another person eating meals?: None Help from another person taking care of personal grooming?: A Little Help from another person toileting, which includes using toliet, bedpan, or urinal?: A Little Help from another person bathing (including washing, rinsing, drying)?: A Little Help from another person to put on and taking off regular upper body clothing?: A Little Help from another person to put on and taking off regular lower body clothing?: A Little 6 Click Score: 19   End of Session Equipment Utilized During Treatment: Gait belt Nurse Communication: Mobility status  Activity Tolerance: Patient tolerated treatment well Patient left: in bed;with call bell/phone within reach;with bed alarm set;with family/visitor present  OT Visit Diagnosis: Unsteadiness on feet (R26.81);Other abnormalities of gait and mobility (R26.89);Muscle weakness (generalized) (M62.81)  Time: 1202-1230 OT Time Calculation (min): 28 min Charges:  OT General Charges $OT Visit: 1 Visit OT  Evaluation $OT Eval Moderate Complexity: 1 Mod  Derenda Mis, OTR/L Acute Rehabilitation Services Office [phone] Secure Chat Communication Preferred   Donia Pounds 10/12/2022, 1:19 PM

## 2022-10-12 NOTE — Progress Notes (Signed)
  Subjective: Patient admitted this morning, see detailed H&P by DR Segars 77 year old female with history of prior CVA with residual left-sided hemiparesis, hypertension, hyperlipidemia, diabetes mellitus type 2, CAD who has loop recorder in place.  [facility] with 2-day history of right leg weakness and falls.  She is on Plavix due to history of prior stroke. CT head was negative, MRI of the brain showed scattered small acute infarcts along the posterior left frontal cortex/white matter in the left occipital cortex and left lower cerebellum.  Neurology was consulted  Vitals:   10/12/22 1037 10/12/22 1046  BP: (!) 194/76 (!) 180/80  Pulse: 79   Resp: 17   Temp: 98.6 F (37 C)   SpO2: 97%       A/P Acute multifocal ischemic stroke -Seen on MRI as above -Neurology consulted -Continue Plavix for now -Continue rosuvastatin, Zetia  Hypertension -Permissive hypertension for 24 hours -Hold amlodipine  Hyperlipidemia -Continue statin  Diabetes mellitus type 2 -Continue Semglee, sliding scale insulin with NovoLog  CAD -Continue statin, Plavix   Meredeth Ide [facility]

## 2022-10-13 ENCOUNTER — Inpatient Hospital Stay (HOSPITAL_COMMUNITY): Payer: Medicare Other

## 2022-10-13 DIAGNOSIS — I6389 Other cerebral infarction: Secondary | ICD-10-CM

## 2022-10-13 DIAGNOSIS — R2990 Unspecified symptoms and signs involving the nervous system: Secondary | ICD-10-CM | POA: Diagnosis not present

## 2022-10-13 DIAGNOSIS — I639 Cerebral infarction, unspecified: Secondary | ICD-10-CM | POA: Diagnosis present

## 2022-10-13 LAB — BASIC METABOLIC PANEL
Anion gap: 6 (ref 5–15)
BUN: 17 mg/dL (ref 8–23)
CO2: 24 mmol/L (ref 22–32)
Calcium: 8.4 mg/dL — ABNORMAL LOW (ref 8.9–10.3)
Chloride: 105 mmol/L (ref 98–111)
Creatinine, Ser: 1.24 mg/dL — ABNORMAL HIGH (ref 0.44–1.00)
GFR, Estimated: 45 mL/min — ABNORMAL LOW (ref >60)
Glucose, Bld: 266 mg/dL — ABNORMAL HIGH (ref 70–99)
Potassium: 3.2 mmol/L — ABNORMAL LOW (ref 3.5–5.1)
Sodium: 135 mmol/L (ref 135–145)

## 2022-10-13 LAB — ECHOCARDIOGRAM COMPLETE
AR max vel: 2.2 cm2
AV Area VTI: 2.26 cm2
AV Area mean vel: 2.25 cm2
AV Mean grad: 6 mm[Hg]
AV Peak grad: 11 mm[Hg]
Ao pk vel: 1.66 m/s
Area-P 1/2: 3.77 cm2
Height: 60 in
S' Lateral: 2 cm
Weight: 1798.95 [oz_av]

## 2022-10-13 LAB — GLUCOSE, CAPILLARY
Glucose-Capillary: 142 mg/dL — ABNORMAL HIGH (ref 70–99)
Glucose-Capillary: 223 mg/dL — ABNORMAL HIGH (ref 70–99)
Glucose-Capillary: 300 mg/dL — ABNORMAL HIGH (ref 70–99)
Glucose-Capillary: 286 mg/dL — ABNORMAL HIGH (ref 70–99)

## 2022-10-13 MED ORDER — AMLODIPINE BESYLATE 5 MG PO TABS
5.0000 mg | ORAL_TABLET | Freq: Every day | ORAL | Status: DC
  Administered 2022-10-13 – 2022-10-15 (×3): 5 mg via ORAL
  Filled 2022-10-13 (×3): qty 1

## 2022-10-13 MED ORDER — TICAGRELOR 90 MG PO TABS
90.0000 mg | ORAL_TABLET | Freq: Two times a day (BID) | ORAL | Status: DC
  Administered 2022-10-13 – 2022-10-16 (×6): 90 mg via ORAL
  Filled 2022-10-13 (×6): qty 1

## 2022-10-13 MED ORDER — ASPIRIN 81 MG PO TBEC
81.0000 mg | DELAYED_RELEASE_TABLET | Freq: Every day | ORAL | Status: DC
  Administered 2022-10-14 – 2022-10-16 (×3): 81 mg via ORAL
  Filled 2022-10-13 (×3): qty 1

## 2022-10-13 MED ORDER — INSULIN ASPART 100 UNIT/ML IJ SOLN
3.0000 [IU] | Freq: Three times a day (TID) | INTRAMUSCULAR | Status: DC
  Administered 2022-10-14 – 2022-10-16 (×6): 3 [IU] via SUBCUTANEOUS

## 2022-10-13 NOTE — H&P (View-Only) (Signed)
Antimicrobials:  Anti-infectives (From admission, onward)    None       Subjective: No new complaints  Objective: Vitals:   10/13/22 0426  10/13/22 0737 10/13/22 1100 10/13/22 1530  BP: (!) 167/72 (!) 183/84 (!) 164/77 (!) 166/78  Pulse: 74 75 77 76  Resp: 16 17 16 17   Temp: 97.8 F (36.6 C) 98.7 F (37.1 C) 98 F (36.7 C) 98.2 F (36.8 C)  TempSrc: Oral Oral Oral Oral  SpO2: 99% 95% 98% 98%  Weight:      Height:        Intake/Output Summary (Last 24 hours) at 10/13/2022 1818 Last data filed at 10/13/2022 1200 Gross per 24 hour  Intake 363 ml  Output --  Net 363 ml   Filed Weights   10/12/22 0046  Weight: 51 kg    Examination:  General exam: Appears calm and comfortable  Respiratory system: unlabored Cardiovascular system: RRR Central nervous system: L sided weakness Extremities: no LEE  Data Reviewed: I have personally reviewed following labs and imaging studies  CBC: Recent Labs  Lab 10/11/22 1902 10/12/22 0613  WBC 9.1 8.5  NEUTROABS 6.0  --   HGB 10.7* 11.0*  HCT 32.3* 33.1*  MCV 93.1 91.4  PLT 231 318    Basic Metabolic Panel: Recent Labs  Lab 10/11/22 1902 10/12/22 0613  NA 138 140  K 3.5 2.9*  CL 106 110  CO2 23 24  GLUCOSE 459* 184*  BUN 15 10  CREATININE 1.01* 0.97  CALCIUM 9.0 9.1  MG  --  1.9  PHOS  --  3.9    GFR: Estimated Creatinine Clearance: 36 mL/min (by C-G formula based on SCr of 0.97 mg/dL).  Liver Function Tests: Recent Labs  Lab 10/11/22 1902  AST 33  ALT 56*  ALKPHOS 72  BILITOT 0.3  PROT 5.8*  ALBUMIN 2.7*    CBG: Recent Labs  Lab 10/12/22 1610 10/12/22 2147 10/13/22 0610 10/13/22 1127 10/13/22 1530  GLUCAP 176* 302* 142* 223* 300*     No results found for this or any previous visit (from the past 240 hour(s)).       Radiology Studies: ECHOCARDIOGRAM COMPLETE  Result Date: 10/13/2022    ECHOCARDIOGRAM REPORT   Patient Name:   Sarah [name] Date of Exam: 10/13/2022 Medical Rec #:  [id number]            Height:       60.0 in Accession #:    [id number]           Weight:       112.4 lb Date of Birth:  06/13/1945            BSA:           1.461 m Patient Age:    77 years             BP:           108/80 mmHg Patient Gender: F                    HR:           77 bpm. Exam Location:  Inpatient Procedure: 2D Echo, Cardiac Doppler and Color Doppler Indications:    Stroke I63.9  History:        Patient has prior history of Echocardiogram examinations, most                 recent 10/24/2021. Hypertrophic Cardiomyopathy, CAD, Abnormal  PROGRESS NOTE    Sarah [name]  [id number] DOB: 10/11/1945 DOA: 10/11/2022 PCP: [place], [name], MD  Chief Complaint  Patient presents with   Weakness    Brief Narrative:   77 year old female with history of prior CVA with residual left-sided hemiparesis, hypertension, hyperlipidemia, diabetes mellitus type 2, CAD who has loop recorder in place.  [facility] with 2-day history of right leg weakness and falls.  She is on Plavix due to history of prior stroke. CT head was negative, MRI of the brain showed scattered small acute infarcts along the posterior left frontal cortex/white matter in the left occipital cortex and left lower cerebellum.  Neurology was consulted  Assessment & Plan:   Principal Problem:   Stroke-like symptoms Active Problems:   Generalized weakness   Stroke (cerebrum) (HCC)   Acute ischemic stroke (HCC)  Acute multifocal ischemic stroke -MRI brain with small acute infarcts scattered in the L cerebral hemisphere and L cerebellum -CTA head/neck without emergent finding, atherosclerosis with moderate to advanced bilateral MCA stenosis, worse on R where there is chronic inferior division infarction - neurology c/s, appreciate recs -> continue plavix (given non adherence prior to admission), no afib on loop recorder - echo with hyperechoic mass on L coronary cusp measuring 4x7 mm (ddx includes healed vegetation vs atypical myoma vs papillary fibroelastoma vs degerenative change related nodule) -> will plan for TEE (EF normal, no RWMA, grade 1 diastolic dysfunction) -Continue rosuvastatin, Zetia -PT/OT/SLP   Hypertension -Permissive hypertension for 24 hours -Hold amlodipine   Hyperlipidemia -Continue statin   Diabetes mellitus type 2 -Continue Semglee, sliding scale insulin with NovoLog   CAD -Continue statin, Plavix   DVT prophylaxis: SCD Code Status: full Family Communication: none Disposition:   Status is:  Inpatient Remains inpatient appropriate because: need for neurology clearance, TEE   Consultants:  neurology  Procedures:  Echo IMPRESSIONS     1. Left ventricular ejection fraction, by estimation, is 65 to 70%. The  left ventricle has normal function. The left ventricle has no regional  wall motion abnormalities. There is mild concentric left ventricular  hypertrophy. Left ventricular diastolic  parameters are consistent with Grade I diastolic dysfunction (impaired  relaxation).   2. Right ventricular systolic function is normal. The right ventricular  size is normal.   3. Left atrial size was mildly dilated.   4. The mitral valve is normal in structure. Trivial mitral valve  regurgitation. No evidence of mitral stenosis.   5. 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 It has Cindia Hustead very short and fairly broad attachment to the leaflet and is  only slightly mobile. Diffierential diagnosis is broad and includes healed  vegetation, atypical myxoma,  papillary fibroelastoma and even degenerative change-related nodule. The  aortic valve is tricuspid. There is mild thickening of the aortic valve.  Aortic valve regurgitation is trivial. Aortic valve  sclerosis/calcification is present, without any evidence   of aortic stenosis.   6. The inferior vena cava is normal in size with greater than 50%  respiratory variability, suggesting right atrial pressure of 3 mmHg.   Comparison(s): Sarah [name] prior study was performed on 10/24/2021. No significant  change from prior study. Prior images reviewed side by side. Sarah [name] similar  abnormality was seen on the left cusp of the aortic valve. It was not seen  on TEE from 2018.   Conclusion(s)/Recommendation(s): Recommend TEE for evaluation of the  aortic valve abnormality.  PROGRESS NOTE    Sarah [name]  [id number] DOB: 10/11/1945 DOA: 10/11/2022 PCP: [place], [name], MD  Chief Complaint  Patient presents with   Weakness    Brief Narrative:   77 year old female with history of prior CVA with residual left-sided hemiparesis, hypertension, hyperlipidemia, diabetes mellitus type 2, CAD who has loop recorder in place.  Initially went to [facility] with 2-day history of right leg weakness and falls.  She is on Plavix due to history of prior stroke. CT head was negative, MRI of the brain showed scattered small acute infarcts along the posterior left frontal cortex/white matter in the left occipital cortex and left lower cerebellum.  Neurology was consulted  Assessment & Plan:   Principal Problem:   Stroke-like symptoms Active Problems:   Generalized weakness   Stroke (cerebrum) (HCC)   Acute ischemic stroke (HCC)  Acute multifocal ischemic stroke -MRI brain with small acute infarcts scattered in the L cerebral hemisphere and L cerebellum -CTA head/neck without emergent finding, atherosclerosis with moderate to advanced bilateral MCA stenosis, worse on R where there is chronic inferior division infarction - neurology c/s, appreciate recs -> continue plavix (given non adherence prior to admission), no afib on loop recorder - echo with hyperechoic mass on L coronary cusp measuring 4x7 mm (ddx includes healed vegetation vs atypical myoma vs papillary fibroelastoma vs degerenative change related nodule) -> will plan for TEE (EF normal, no RWMA, grade 1 diastolic dysfunction) -Continue rosuvastatin, Zetia -PT/OT/SLP   Hypertension -Permissive hypertension for 24 hours -Hold amlodipine   Hyperlipidemia -Continue statin   Diabetes mellitus type 2 -Continue Semglee, sliding scale insulin with NovoLog   CAD -Continue statin, Plavix   DVT prophylaxis: SCD Code Status: full Family Communication: none Disposition:   Status is:  Inpatient Remains inpatient appropriate because: need for neurology clearance, TEE   Consultants:  neurology  Procedures:  Echo IMPRESSIONS     1. Left ventricular ejection fraction, by estimation, is 65 to 70%. The  left ventricle has normal function. The left ventricle has no regional  wall motion abnormalities. There is mild concentric left ventricular  hypertrophy. Left ventricular diastolic  parameters are consistent with Grade I diastolic dysfunction (impaired  relaxation).   2. Right ventricular systolic function is normal. The right ventricular  size is normal.   3. Left atrial size was mildly dilated.   4. The mitral valve is normal in structure. Trivial mitral valve  regurgitation. No evidence of mitral stenosis.   5. 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 It has Cindia Hustead very short and fairly broad attachment to the leaflet and is  only slightly mobile. Diffierential diagnosis is broad and includes healed  vegetation, atypical myxoma,  papillary fibroelastoma and even degenerative change-related nodule. The  aortic valve is tricuspid. There is mild thickening of the aortic valve.  Aortic valve regurgitation is trivial. Aortic valve  sclerosis/calcification is present, without any evidence   of aortic stenosis.   6. The inferior vena cava is normal in size with greater than 50%  respiratory variability, suggesting right atrial pressure of 3 mmHg.   Comparison(s): Sarah [name] prior study was performed on 10/24/2021. No significant  change from prior study. Prior images reviewed side by side. Sarah [name] similar  abnormality was seen on the left cusp of the aortic valve. It was not seen  on TEE from 2018.   Conclusion(s)/Recommendation(s): Recommend TEE for evaluation of the  aortic valve abnormality.  PROGRESS NOTE    Sarah [name]  [id number] DOB: 10/11/1945 DOA: 10/11/2022 PCP: [place], [name], MD  Chief Complaint  Patient presents with   Weakness    Brief Narrative:   77 year old female with history of prior CVA with residual left-sided hemiparesis, hypertension, hyperlipidemia, diabetes mellitus type 2, CAD who has loop recorder in place.  Initially went to [facility] with 2-day history of right leg weakness and falls.  She is on Plavix due to history of prior stroke. CT head was negative, MRI of the brain showed scattered small acute infarcts along the posterior left frontal cortex/white matter in the left occipital cortex and left lower cerebellum.  Neurology was consulted  Assessment & Plan:   Principal Problem:   Stroke-like symptoms Active Problems:   Generalized weakness   Stroke (cerebrum) (HCC)   Acute ischemic stroke (HCC)  Acute multifocal ischemic stroke -MRI brain with small acute infarcts scattered in the L cerebral hemisphere and L cerebellum -CTA head/neck without emergent finding, atherosclerosis with moderate to advanced bilateral MCA stenosis, worse on R where there is chronic inferior division infarction - neurology c/s, appreciate recs -> continue plavix (given non adherence prior to admission), no afib on loop recorder - echo with hyperechoic mass on L coronary cusp measuring 4x7 mm (ddx includes healed vegetation vs atypical myoma vs papillary fibroelastoma vs degerenative change related nodule) -> will plan for TEE (EF normal, no RWMA, grade 1 diastolic dysfunction) -Continue rosuvastatin, Zetia -PT/OT/SLP   Hypertension -Permissive hypertension for 24 hours -Hold amlodipine   Hyperlipidemia -Continue statin   Diabetes mellitus type 2 -Continue Semglee, sliding scale insulin with NovoLog   CAD -Continue statin, Plavix   DVT prophylaxis: SCD Code Status: full Family Communication: none Disposition:   Status is:  Inpatient Remains inpatient appropriate because: need for neurology clearance, TEE   Consultants:  neurology  Procedures:  Echo IMPRESSIONS     1. Left ventricular ejection fraction, by estimation, is 65 to 70%. The  left ventricle has normal function. The left ventricle has no regional  wall motion abnormalities. There is mild concentric left ventricular  hypertrophy. Left ventricular diastolic  parameters are consistent with Grade I diastolic dysfunction (impaired  relaxation).   2. Right ventricular systolic function is normal. The right ventricular  size is normal.   3. Left atrial size was mildly dilated.   4. The mitral valve is normal in structure. Trivial mitral valve  regurgitation. No evidence of mitral stenosis.   5. 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 It has Cindia Hustead very short and fairly broad attachment to the leaflet and is  only slightly mobile. Diffierential diagnosis is broad and includes healed  vegetation, atypical myxoma,  papillary fibroelastoma and even degenerative change-related nodule. The  aortic valve is tricuspid. There is mild thickening of the aortic valve.  Aortic valve regurgitation is trivial. Aortic valve  sclerosis/calcification is present, without any evidence   of aortic stenosis.   6. The inferior vena cava is normal in size with greater than 50%  respiratory variability, suggesting right atrial pressure of 3 mmHg.   Comparison(s): Sarah [name] prior study was performed on 10/24/2021. No significant  change from prior study. Prior images reviewed side by side. Sarah [name] similar  abnormality was seen on the left cusp of the aortic valve. It was not seen  on TEE from 2018.   Conclusion(s)/Recommendation(s): Recommend TEE for evaluation of the  aortic valve abnormality.  Antimicrobials:  Anti-infectives (From admission, onward)    None       Subjective: No new complaints  Objective: Vitals:   10/13/22 0426  10/13/22 0737 10/13/22 1100 10/13/22 1530  BP: (!) 167/72 (!) 183/84 (!) 164/77 (!) 166/78  Pulse: 74 75 77 76  Resp: 16 17 16 17   Temp: 97.8 F (36.6 C) 98.7 F (37.1 C) 98 F (36.7 C) 98.2 F (36.8 C)  TempSrc: Oral Oral Oral Oral  SpO2: 99% 95% 98% 98%  Weight:      Height:        Intake/Output Summary (Last 24 hours) at 10/13/2022 1818 Last data filed at 10/13/2022 1200 Gross per 24 hour  Intake 363 ml  Output --  Net 363 ml   Filed Weights   10/12/22 0046  Weight: 51 kg    Examination:  General exam: Appears calm and comfortable  Respiratory system: unlabored Cardiovascular system: RRR Central nervous system: L sided weakness Extremities: no LEE  Data Reviewed: I have personally reviewed following labs and imaging studies  CBC: Recent Labs  Lab 10/11/22 1902 10/12/22 0613  WBC 9.1 8.5  NEUTROABS 6.0  --   HGB 10.7* 11.0*  HCT 32.3* 33.1*  MCV 93.1 91.4  PLT 231 318    Basic Metabolic Panel: Recent Labs  Lab 10/11/22 1902 10/12/22 0613  NA 138 140  K 3.5 2.9*  CL 106 110  CO2 23 24  GLUCOSE 459* 184*  BUN 15 10  CREATININE 1.01* 0.97  CALCIUM 9.0 9.1  MG  --  1.9  PHOS  --  3.9    GFR: Estimated Creatinine Clearance: 36 mL/min (by C-G formula based on SCr of 0.97 mg/dL).  Liver Function Tests: Recent Labs  Lab 10/11/22 1902  AST 33  ALT 56*  ALKPHOS 72  BILITOT 0.3  PROT 5.8*  ALBUMIN 2.7*    CBG: Recent Labs  Lab 10/12/22 1610 10/12/22 2147 10/13/22 0610 10/13/22 1127 10/13/22 1530  GLUCAP 176* 302* 142* 223* 300*     No results found for this or any previous visit (from the past 240 hour(s)).       Radiology Studies: ECHOCARDIOGRAM COMPLETE  Result Date: 10/13/2022    ECHOCARDIOGRAM REPORT   Patient Name:   Sarah [name] Date of Exam: 10/13/2022 Medical Rec #:  [id number]            Height:       60.0 in Accession #:    [id number]           Weight:       112.4 lb Date of Birth:  06/13/1945            BSA:           1.461 m Patient Age:    77 years             BP:           108/80 mmHg Patient Gender: F                    HR:           77 bpm. Exam Location:  Inpatient Procedure: 2D Echo, Cardiac Doppler and Color Doppler Indications:    Stroke I63.9  History:        Patient has prior history of Echocardiogram examinations, most                 recent 10/24/2021. Hypertrophic Cardiomyopathy, CAD, Abnormal  PROGRESS NOTE    Sarah [name]  [id number] DOB: 10/11/1945 DOA: 10/11/2022 PCP: [place], [name], MD  Chief Complaint  Patient presents with   Weakness    Brief Narrative:   77 year old female with history of prior CVA with residual left-sided hemiparesis, hypertension, hyperlipidemia, diabetes mellitus type 2, CAD who has loop recorder in place.  Initially went to [facility] with 2-day history of right leg weakness and falls.  She is on Plavix due to history of prior stroke. CT head was negative, MRI of the brain showed scattered small acute infarcts along the posterior left frontal cortex/white matter in the left occipital cortex and left lower cerebellum.  Neurology was consulted  Assessment & Plan:   Principal Problem:   Stroke-like symptoms Active Problems:   Generalized weakness   Stroke (cerebrum) (HCC)   Acute ischemic stroke (HCC)  Acute multifocal ischemic stroke -MRI brain with small acute infarcts scattered in the L cerebral hemisphere and L cerebellum -CTA head/neck without emergent finding, atherosclerosis with moderate to advanced bilateral MCA stenosis, worse on R where there is chronic inferior division infarction - neurology c/s, appreciate recs -> continue plavix (given non adherence prior to admission), no afib on loop recorder - echo with hyperechoic mass on L coronary cusp measuring 4x7 mm (ddx includes healed vegetation vs atypical myoma vs papillary fibroelastoma vs degerenative change related nodule) -> will plan for TEE (EF normal, no RWMA, grade 1 diastolic dysfunction) -Continue rosuvastatin, Zetia -PT/OT/SLP   Hypertension -Permissive hypertension for 24 hours -Hold amlodipine   Hyperlipidemia -Continue statin   Diabetes mellitus type 2 -Continue Semglee, sliding scale insulin with NovoLog   CAD -Continue statin, Plavix   DVT prophylaxis: SCD Code Status: full Family Communication: none Disposition:   Status is:  Inpatient Remains inpatient appropriate because: need for neurology clearance, TEE   Consultants:  neurology  Procedures:  Echo IMPRESSIONS     1. Left ventricular ejection fraction, by estimation, is 65 to 70%. The  left ventricle has normal function. The left ventricle has no regional  wall motion abnormalities. There is mild concentric left ventricular  hypertrophy. Left ventricular diastolic  parameters are consistent with Grade I diastolic dysfunction (impaired  relaxation).   2. Right ventricular systolic function is normal. The right ventricular  size is normal.   3. Left atrial size was mildly dilated.   4. The mitral valve is normal in structure. Trivial mitral valve  regurgitation. No evidence of mitral stenosis.   5. 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 It has Cindia Hustead very short and fairly broad attachment to the leaflet and is  only slightly mobile. Diffierential diagnosis is broad and includes healed  vegetation, atypical myxoma,  papillary fibroelastoma and even degenerative change-related nodule. The  aortic valve is tricuspid. There is mild thickening of the aortic valve.  Aortic valve regurgitation is trivial. Aortic valve  sclerosis/calcification is present, without any evidence   of aortic stenosis.   6. The inferior vena cava is normal in size with greater than 50%  respiratory variability, suggesting right atrial pressure of 3 mmHg.   Comparison(s): Sarah [name] prior study was performed on 10/24/2021. No significant  change from prior study. Prior images reviewed side by side. Sarah [name] similar  abnormality was seen on the left cusp of the aortic valve. It was not seen  on TEE from 2018.   Conclusion(s)/Recommendation(s): Recommend TEE for evaluation of the  aortic valve abnormality.

## 2022-10-13 NOTE — Progress Notes (Signed)
*  PRELIMINARY RESULTS* Echocardiogram 2D Echocardiogram has been performed.  Laddie Aquas 10/13/2022, 5:31 PM

## 2022-10-13 NOTE — Plan of Care (Signed)
  Problem: Education: Goal: Understanding of cardiac disease, CV risk reduction, and recovery process will improve Outcome: Progressing Goal: Individualized Educational Video(s) Outcome: Progressing   Problem: Activity: Goal: Ability to tolerate increased activity will improve Outcome: Progressing   Problem: Coping: Goal: Will verbalize positive feelings about self Outcome: Progressing Goal: Will identify appropriate support needs Outcome: Progressing   Problem: Self-Care: Goal: Ability to participate in self-care as condition permits will improve Outcome: Progressing Goal: Verbalization of feelings and concerns over difficulty with self-care will improve Outcome: Progressing Goal: Ability to communicate needs accurately will improve Outcome: Progressing   Problem: Nutrition: Goal: Risk of aspiration will decrease Outcome: Progressing Goal: Dietary intake will improve Outcome: Progressing   Problem: Coping: Goal: Level of anxiety will decrease Outcome: Progressing   Problem: Elimination: Goal: Will not experience complications related to bowel motility Outcome: Progressing Goal: Will not experience complications related to urinary retention Outcome: Progressing   Problem: Safety: Goal: Ability to remain free from injury will improve Outcome: Progressing   Problem: Skin Integrity: Goal: Risk for impaired skin integrity will decrease Outcome: Progressing

## 2022-10-13 NOTE — Progress Notes (Signed)
Physical Therapy Treatment Patient Details Name: Sarah [name] MRN: [id number] DOB: 1945/02/11 Today's Date: 10/13/2022   History of Present Illness Sarah [name] is a 77 y.o. female who presented after 2 days of RLE weakness and falls. MRI with small scattered acute infarcts along the posterior left frontal cortex/white matter in the left occipital cortex, and in the lower left cerebellum. PMHx: CVA with residual left-sided hemiparesis, hypertension, hyperlipidemia, diabetes, CAD    PT Comments  Good progress towards acute rehab functional goals today. Increased ambulatory capacity with reduced assistance, mobilizing at a CGA level with RW for support up to 75 feet. Staggering forward at times, needs cues for awareness and walker placement to proximity. In and out of bed without physical assist but effortful. Needed cues for direction finding back to room. States family have arranged supervision to help pt as needed once d/c. Patient will continue to benefit from skilled physical therapy services to further improve independence with functional mobility.     If plan is discharge home, recommend the following: A little help with walking and/or transfers;Direct supervision/assist for medications management;Supervision due to cognitive status;Direct supervision/assist for financial management;Assistance with cooking/housework;Assist for transportation;A little help with bathing/dressing/bathroom;Help with stairs or ramp for entrance   Can travel by private vehicle        Equipment Recommendations  Rolling walker (2 wheels)    Recommendations for Other Services       Precautions / Restrictions Precautions Precautions: Fall Restrictions Weight Bearing Restrictions: No     Mobility  Bed Mobility Overal bed mobility: Needs Assistance Bed Mobility: Supine to Sit     Supine to sit: Supervision Sit to supine: Supervision   General bed mobility comments: supervision for  safety, slower and with effort but no physical assist required.    Transfers Overall transfer level: Needs assistance Equipment used: Rolling walker (2 wheels) Transfers: Sit to/from Stand Sit to Stand: Contact guard assist           General transfer comment: CGA for safety, slow to rise but stable with bil UE support on RW. Cues for technique.    Ambulation/Gait Ambulation/Gait assistance: Contact guard assist Gait Distance (Feet): 75 Feet Assistive device: Rolling walker (2 wheels) Gait Pattern/deviations: Step-through pattern, Decreased step length - right, Decreased step length - left, Trunk flexed, Drifts right/left, Shuffle Gait velocity: decreased Gait velocity interpretation: <1.31 ft/sec, indicative of household ambulator   General Gait Details: Cues for RW placement closer to proximity and upright posture for improved foot clearance. Occasionally allowing RW to drift forward and shuffles feet to catch up. Cues for awareness. CGA for safety. Needs cues for direction finding back to room.   Stairs Stairs:  (Declines this date)           Wheelchair Mobility     Tilt Bed    Modified Rankin (Stroke Patients Only) Modified Rankin (Stroke Patients Only) Pre-Morbid Rankin Score: Slight disability Modified Rankin: Moderately severe disability     Balance Overall balance assessment: History of Falls, Needs assistance Sitting-balance support: Feet supported, No upper extremity supported Sitting balance-Leahy Scale: Fair     Standing balance support: Bilateral upper extremity supported, During functional activity, Reliant on assistive device for balance Standing balance-Leahy Scale: Poor                              Cognition Arousal: Alert Behavior During Therapy: Flat affect Overall Cognitive Status: No family/caregiver present to determine  baseline cognitive functioning                                          Exercises       General Comments        Pertinent Vitals/Pain Pain Assessment Pain Assessment: No/denies pain    Home Living                          Prior Function            PT Goals (current goals can now be found in the care plan section) Acute Rehab PT Goals Patient Stated Goal: go home PT Goal Formulation: With patient Time For Goal Achievement: 10/26/22 Potential to Achieve Goals: Good Progress towards PT goals: Progressing toward goals    Frequency    Min 1X/week      PT Plan      Co-evaluation              AM-PAC PT "6 Clicks" Mobility   Outcome Measure  Help needed turning from your back to your side while in a flat bed without using bedrails?: A Little Help needed moving from lying on your back to sitting on the side of a flat bed without using bedrails?: A Little Help needed moving to and from a bed to a chair (including a wheelchair)?: A Little Help needed standing up from a chair using your arms (e.g., wheelchair or bedside chair)?: A Little Help needed to walk in hospital room?: A Little Help needed climbing 3-5 steps with a railing? : A Lot 6 Click Score: 17    End of Session Equipment Utilized During Treatment: Gait belt Activity Tolerance: Patient tolerated treatment well Patient left: in bed;with call bell/phone within reach;with bed alarm set (Transport taking pt to ECHO) Nurse Communication: Mobility status PT Visit Diagnosis: Unsteadiness on feet (R26.81);Difficulty in walking, not elsewhere classified (R26.2);Muscle weakness (generalized) (M62.81);History of falling (Z91.81)     Time: 1610-9604 PT Time Calculation (min) (ACUTE ONLY): 13 min  Charges:    $Gait Training: 8-22 mins PT General Charges $$ ACUTE PT VISIT: 1 Visit                     Kathlyn Sacramento, PT, DPT [facility]  Rehabilitation Services Physical Therapist Office: [phone] Website: [email or web address]    Berton Mount 10/13/2022, 5:28 PM

## 2022-10-13 NOTE — Progress Notes (Signed)
Antimicrobials:  Anti-infectives (From admission, onward)    None       Subjective: No new complaints  Objective: Vitals:   10/13/22 0426  10/13/22 0737 10/13/22 1100 10/13/22 1530  BP: (!) 167/72 (!) 183/84 (!) 164/77 (!) 166/78  Pulse: 74 75 77 76  Resp: 16 17 16 17   Temp: 97.8 F (36.6 C) 98.7 F (37.1 C) 98 F (36.7 C) 98.2 F (36.8 C)  TempSrc: Oral Oral Oral Oral  SpO2: 99% 95% 98% 98%  Weight:      Height:        Intake/Output Summary (Last 24 hours) at 10/13/2022 1818 Last data filed at 10/13/2022 1200 Gross per 24 hour  Intake 363 ml  Output --  Net 363 ml   Filed Weights   10/12/22 0046  Weight: 51 kg    Examination:  General exam: Appears calm and comfortable  Respiratory system: unlabored Cardiovascular system: RRR Central nervous system: L sided weakness Extremities: no LEE  Data Reviewed: I have personally reviewed following labs and imaging studies  CBC: Recent Labs  Lab 10/11/22 1902 10/12/22 0613  WBC 9.1 8.5  NEUTROABS 6.0  --   HGB 10.7* 11.0*  HCT 32.3* 33.1*  MCV 93.1 91.4  PLT 231 318    Basic Metabolic Panel: Recent Labs  Lab 10/11/22 1902 10/12/22 0613  NA 138 140  K 3.5 2.9*  CL 106 110  CO2 23 24  GLUCOSE 459* 184*  BUN 15 10  CREATININE 1.01* 0.97  CALCIUM 9.0 9.1  MG  --  1.9  PHOS  --  3.9    GFR: Estimated Creatinine Clearance: 36 mL/min (by C-G formula based on SCr of 0.97 mg/dL).  Liver Function Tests: Recent Labs  Lab 10/11/22 1902  AST 33  ALT 56*  ALKPHOS 72  BILITOT 0.3  PROT 5.8*  ALBUMIN 2.7*    CBG: Recent Labs  Lab 10/12/22 1610 10/12/22 2147 10/13/22 0610 10/13/22 1127 10/13/22 1530  GLUCAP 176* 302* 142* 223* 300*     No results found for this or any previous visit (from the past 240 hour(s)).       Radiology Studies: ECHOCARDIOGRAM COMPLETE  Result Date: 10/13/2022    ECHOCARDIOGRAM REPORT   Patient Name:   Sarah [name] Date of Exam: 10/13/2022 Medical Rec #:  [id number]            Height:       60.0 in Accession #:    [id number]           Weight:       112.4 lb Date of Birth:  06/13/1945            BSA:           1.461 m Patient Age:    77 years             BP:           108/80 mmHg Patient Gender: F                    HR:           77 bpm. Exam Location:  Inpatient Procedure: 2D Echo, Cardiac Doppler and Color Doppler Indications:    Stroke I63.9  History:        Patient has prior history of Echocardiogram examinations, most                 recent 10/24/2021. Hypertrophic Cardiomyopathy, CAD, Abnormal  Antimicrobials:  Anti-infectives (From admission, onward)    None       Subjective: No new complaints  Objective: Vitals:   10/13/22 0426  10/13/22 0737 10/13/22 1100 10/13/22 1530  BP: (!) 167/72 (!) 183/84 (!) 164/77 (!) 166/78  Pulse: 74 75 77 76  Resp: 16 17 16 17   Temp: 97.8 F (36.6 C) 98.7 F (37.1 C) 98 F (36.7 C) 98.2 F (36.8 C)  TempSrc: Oral Oral Oral Oral  SpO2: 99% 95% 98% 98%  Weight:      Height:        Intake/Output Summary (Last 24 hours) at 10/13/2022 1818 Last data filed at 10/13/2022 1200 Gross per 24 hour  Intake 363 ml  Output --  Net 363 ml   Filed Weights   10/12/22 0046  Weight: 51 kg    Examination:  General exam: Appears calm and comfortable  Respiratory system: unlabored Cardiovascular system: RRR Central nervous system: L sided weakness Extremities: no LEE  Data Reviewed: I have personally reviewed following labs and imaging studies  CBC: Recent Labs  Lab 10/11/22 1902 10/12/22 0613  WBC 9.1 8.5  NEUTROABS 6.0  --   HGB 10.7* 11.0*  HCT 32.3* 33.1*  MCV 93.1 91.4  PLT 231 318    Basic Metabolic Panel: Recent Labs  Lab 10/11/22 1902 10/12/22 0613  NA 138 140  K 3.5 2.9*  CL 106 110  CO2 23 24  GLUCOSE 459* 184*  BUN 15 10  CREATININE 1.01* 0.97  CALCIUM 9.0 9.1  MG  --  1.9  PHOS  --  3.9    GFR: Estimated Creatinine Clearance: 36 mL/min (by C-G formula based on SCr of 0.97 mg/dL).  Liver Function Tests: Recent Labs  Lab 10/11/22 1902  AST 33  ALT 56*  ALKPHOS 72  BILITOT 0.3  PROT 5.8*  ALBUMIN 2.7*    CBG: Recent Labs  Lab 10/12/22 1610 10/12/22 2147 10/13/22 0610 10/13/22 1127 10/13/22 1530  GLUCAP 176* 302* 142* 223* 300*     No results found for this or any previous visit (from the past 240 hour(s)).       Radiology Studies: ECHOCARDIOGRAM COMPLETE  Result Date: 10/13/2022    ECHOCARDIOGRAM REPORT   Patient Name:   Sarah [name] Date of Exam: 10/13/2022 Medical Rec #:  [id number]            Height:       60.0 in Accession #:    [id number]           Weight:       112.4 lb Date of Birth:  06/13/1945            BSA:           1.461 m Patient Age:    77 years             BP:           108/80 mmHg Patient Gender: F                    HR:           77 bpm. Exam Location:  Inpatient Procedure: 2D Echo, Cardiac Doppler and Color Doppler Indications:    Stroke I63.9  History:        Patient has prior history of Echocardiogram examinations, most                 recent 10/24/2021. Hypertrophic Cardiomyopathy, CAD, Abnormal  PROGRESS NOTE    Sarah [name]  ZOX:[id number] DOB: 10/11/1945 DOA: 10/11/2022 PCP: [place], [name], MD  Chief Complaint  Patient presents with   Weakness    Brief Narrative:   77 year old female with history of prior CVA with residual left-sided hemiparesis, hypertension, hyperlipidemia, diabetes mellitus type 2, CAD who has loop recorder in place.  [facility] with 2-day history of right leg weakness and falls.  She is on Plavix due to history of prior stroke. CT head was negative, MRI of the brain showed scattered small acute infarcts along the posterior left frontal cortex/white matter in the left occipital cortex and left lower cerebellum.  Neurology was consulted  Assessment & Plan:   Principal Problem:   Stroke-like symptoms Active Problems:   Generalized weakness   Stroke (cerebrum) (HCC)   Acute ischemic stroke (HCC)  Acute multifocal ischemic stroke -MRI brain with small acute infarcts scattered in the L cerebral hemisphere and L cerebellum -CTA head/neck without emergent finding, atherosclerosis with moderate to advanced bilateral MCA stenosis, worse on R where there is chronic inferior division infarction - neurology c/s, appreciate recs -> continue plavix (given non adherence prior to admission), no afib on loop recorder - echo with hyperechoic mass on L coronary cusp measuring 4x7 mm (ddx includes healed vegetation vs atypical myoma vs papillary fibroelastoma vs degerenative change related nodule) -> will plan for TEE (EF normal, no RWMA, grade 1 diastolic dysfunction) -Continue rosuvastatin, Zetia -PT/OT/SLP   Hypertension -Permissive hypertension for 24 hours -Hold amlodipine   Hyperlipidemia -Continue statin   Diabetes mellitus type 2 -Continue Semglee, sliding scale insulin with NovoLog   CAD -Continue statin, Plavix   DVT prophylaxis: SCD Code Status: full Family Communication: none Disposition:   Status is:  Inpatient Remains inpatient appropriate because: need for neurology clearance, TEE   Consultants:  neurology  Procedures:  Echo IMPRESSIONS     1. Left ventricular ejection fraction, by estimation, is 65 to 70%. The  left ventricle has normal function. The left ventricle has no regional  wall motion abnormalities. There is mild concentric left ventricular  hypertrophy. Left ventricular diastolic  parameters are consistent with Grade I diastolic dysfunction (impaired  relaxation).   2. Right ventricular systolic function is normal. The right ventricular  size is normal.   3. Left atrial size was mildly dilated.   4. The mitral valve is normal in structure. Trivial mitral valve  regurgitation. No evidence of mitral stenosis.   5. 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 It has Cindia Hustead very short and fairly broad attachment to the leaflet and is  only slightly mobile. Diffierential diagnosis is broad and includes healed  vegetation, atypical myxoma,  papillary fibroelastoma and even degenerative change-related nodule. The  aortic valve is tricuspid. There is mild thickening of the aortic valve.  Aortic valve regurgitation is trivial. Aortic valve  sclerosis/calcification is present, without any evidence   of aortic stenosis.   6. The inferior vena cava is normal in size with greater than 50%  respiratory variability, suggesting right atrial pressure of 3 mmHg.   Comparison(s): Brilynn Biasi prior study was performed on 10/24/2021. No significant  change from prior study. Prior images reviewed side by side. [place] similar  abnormality was seen on the left cusp of the aortic valve. It was not seen  on TEE from 2018.   Conclusion(s)/Recommendation(s): Recommend TEE for evaluation of the  aortic valve abnormality.  PROGRESS NOTE    Sarah [name]  ZOX:[id number] DOB: 10/11/1945 DOA: 10/11/2022 PCP: [place], [name], MD  Chief Complaint  Patient presents with   Weakness    Brief Narrative:   77 year old female with history of prior CVA with residual left-sided hemiparesis, hypertension, hyperlipidemia, diabetes mellitus type 2, CAD who has loop recorder in place.  Initially went to [facility] with 2-day history of right leg weakness and falls.  She is on Plavix due to history of prior stroke. CT head was negative, MRI of the brain showed scattered small acute infarcts along the posterior left frontal cortex/white matter in the left occipital cortex and left lower cerebellum.  Neurology was consulted  Assessment & Plan:   Principal Problem:   Stroke-like symptoms Active Problems:   Generalized weakness   Stroke (cerebrum) (HCC)   Acute ischemic stroke (HCC)  Acute multifocal ischemic stroke -MRI brain with small acute infarcts scattered in the L cerebral hemisphere and L cerebellum -CTA head/neck without emergent finding, atherosclerosis with moderate to advanced bilateral MCA stenosis, worse on R where there is chronic inferior division infarction - neurology c/s, appreciate recs -> continue plavix (given non adherence prior to admission), no afib on loop recorder - echo with hyperechoic mass on L coronary cusp measuring 4x7 mm (ddx includes healed vegetation vs atypical myoma vs papillary fibroelastoma vs degerenative change related nodule) -> will plan for TEE (EF normal, no RWMA, grade 1 diastolic dysfunction) -Continue rosuvastatin, Zetia -PT/OT/SLP   Hypertension -Permissive hypertension for 24 hours -Hold amlodipine   Hyperlipidemia -Continue statin   Diabetes mellitus type 2 -Continue Semglee, sliding scale insulin with NovoLog   CAD -Continue statin, Plavix   DVT prophylaxis: SCD Code Status: full Family Communication: none Disposition:   Status is:  Inpatient Remains inpatient appropriate because: need for neurology clearance, TEE   Consultants:  neurology  Procedures:  Echo IMPRESSIONS     1. Left ventricular ejection fraction, by estimation, is 65 to 70%. The  left ventricle has normal function. The left ventricle has no regional  wall motion abnormalities. There is mild concentric left ventricular  hypertrophy. Left ventricular diastolic  parameters are consistent with Grade I diastolic dysfunction (impaired  relaxation).   2. Right ventricular systolic function is normal. The right ventricular  size is normal.   3. Left atrial size was mildly dilated.   4. The mitral valve is normal in structure. Trivial mitral valve  regurgitation. No evidence of mitral stenosis.   5. 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 It has Cindia Hustead very short and fairly broad attachment to the leaflet and is  only slightly mobile. Diffierential diagnosis is broad and includes healed  vegetation, atypical myxoma,  papillary fibroelastoma and even degenerative change-related nodule. The  aortic valve is tricuspid. There is mild thickening of the aortic valve.  Aortic valve regurgitation is trivial. Aortic valve  sclerosis/calcification is present, without any evidence   of aortic stenosis.   6. The inferior vena cava is normal in size with greater than 50%  respiratory variability, suggesting right atrial pressure of 3 mmHg.   Comparison(s): Brilynn Biasi prior study was performed on 10/24/2021. No significant  change from prior study. Prior images reviewed side by side. [place] similar  abnormality was seen on the left cusp of the aortic valve. It was not seen  on TEE from 2018.   Conclusion(s)/Recommendation(s): Recommend TEE for evaluation of the  aortic valve abnormality.  PROGRESS NOTE    Sarah [name]  ZOX:[id number] DOB: 10/11/1945 DOA: 10/11/2022 PCP: [place], [name], MD  Chief Complaint  Patient presents with   Weakness    Brief Narrative:   77 year old female with history of prior CVA with residual left-sided hemiparesis, hypertension, hyperlipidemia, diabetes mellitus type 2, CAD who has loop recorder in place.  Initially went to [facility] with 2-day history of right leg weakness and falls.  She is on Plavix due to history of prior stroke. CT head was negative, MRI of the brain showed scattered small acute infarcts along the posterior left frontal cortex/white matter in the left occipital cortex and left lower cerebellum.  Neurology was consulted  Assessment & Plan:   Principal Problem:   Stroke-like symptoms Active Problems:   Generalized weakness   Stroke (cerebrum) (HCC)   Acute ischemic stroke (HCC)  Acute multifocal ischemic stroke -MRI brain with small acute infarcts scattered in the L cerebral hemisphere and L cerebellum -CTA head/neck without emergent finding, atherosclerosis with moderate to advanced bilateral MCA stenosis, worse on R where there is chronic inferior division infarction - neurology c/s, appreciate recs -> continue plavix (given non adherence prior to admission), no afib on loop recorder - echo with hyperechoic mass on L coronary cusp measuring 4x7 mm (ddx includes healed vegetation vs atypical myoma vs papillary fibroelastoma vs degerenative change related nodule) -> will plan for TEE (EF normal, no RWMA, grade 1 diastolic dysfunction) -Continue rosuvastatin, Zetia -PT/OT/SLP   Hypertension -Permissive hypertension for 24 hours -Hold amlodipine   Hyperlipidemia -Continue statin   Diabetes mellitus type 2 -Continue Semglee, sliding scale insulin with NovoLog   CAD -Continue statin, Plavix   DVT prophylaxis: SCD Code Status: full Family Communication: none Disposition:   Status is:  Inpatient Remains inpatient appropriate because: need for neurology clearance, TEE   Consultants:  neurology  Procedures:  Echo IMPRESSIONS     1. Left ventricular ejection fraction, by estimation, is 65 to 70%. The  left ventricle has normal function. The left ventricle has no regional  wall motion abnormalities. There is mild concentric left ventricular  hypertrophy. Left ventricular diastolic  parameters are consistent with Grade I diastolic dysfunction (impaired  relaxation).   2. Right ventricular systolic function is normal. The right ventricular  size is normal.   3. Left atrial size was mildly dilated.   4. The mitral valve is normal in structure. Trivial mitral valve  regurgitation. No evidence of mitral stenosis.   5. 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 It has Cindia Hustead very short and fairly broad attachment to the leaflet and is  only slightly mobile. Diffierential diagnosis is broad and includes healed  vegetation, atypical myxoma,  papillary fibroelastoma and even degenerative change-related nodule. The  aortic valve is tricuspid. There is mild thickening of the aortic valve.  Aortic valve regurgitation is trivial. Aortic valve  sclerosis/calcification is present, without any evidence   of aortic stenosis.   6. The inferior vena cava is normal in size with greater than 50%  respiratory variability, suggesting right atrial pressure of 3 mmHg.   Comparison(s): Brilynn Biasi prior study was performed on 10/24/2021. No significant  change from prior study. Prior images reviewed side by side. [place] similar  abnormality was seen on the left cusp of the aortic valve. It was not seen  on TEE from 2018.   Conclusion(s)/Recommendation(s): Recommend TEE for evaluation of the  aortic valve abnormality.  PROGRESS NOTE    Sarah [name]  ZOX:[id number] DOB: 10/11/1945 DOA: 10/11/2022 PCP: [place], [name], MD  Chief Complaint  Patient presents with   Weakness    Brief Narrative:   77 year old female with history of prior CVA with residual left-sided hemiparesis, hypertension, hyperlipidemia, diabetes mellitus type 2, CAD who has loop recorder in place.  Initially went to [facility] with 2-day history of right leg weakness and falls.  She is on Plavix due to history of prior stroke. CT head was negative, MRI of the brain showed scattered small acute infarcts along the posterior left frontal cortex/white matter in the left occipital cortex and left lower cerebellum.  Neurology was consulted  Assessment & Plan:   Principal Problem:   Stroke-like symptoms Active Problems:   Generalized weakness   Stroke (cerebrum) (HCC)   Acute ischemic stroke (HCC)  Acute multifocal ischemic stroke -MRI brain with small acute infarcts scattered in the L cerebral hemisphere and L cerebellum -CTA head/neck without emergent finding, atherosclerosis with moderate to advanced bilateral MCA stenosis, worse on R where there is chronic inferior division infarction - neurology c/s, appreciate recs -> continue plavix (given non adherence prior to admission), no afib on loop recorder - echo with hyperechoic mass on L coronary cusp measuring 4x7 mm (ddx includes healed vegetation vs atypical myoma vs papillary fibroelastoma vs degerenative change related nodule) -> will plan for TEE (EF normal, no RWMA, grade 1 diastolic dysfunction) -Continue rosuvastatin, Zetia -PT/OT/SLP   Hypertension -Permissive hypertension for 24 hours -Hold amlodipine   Hyperlipidemia -Continue statin   Diabetes mellitus type 2 -Continue Semglee, sliding scale insulin with NovoLog   CAD -Continue statin, Plavix   DVT prophylaxis: SCD Code Status: full Family Communication: none Disposition:   Status is:  Inpatient Remains inpatient appropriate because: need for neurology clearance, TEE   Consultants:  neurology  Procedures:  Echo IMPRESSIONS     1. Left ventricular ejection fraction, by estimation, is 65 to 70%. The  left ventricle has normal function. The left ventricle has no regional  wall motion abnormalities. There is mild concentric left ventricular  hypertrophy. Left ventricular diastolic  parameters are consistent with Grade I diastolic dysfunction (impaired  relaxation).   2. Right ventricular systolic function is normal. The right ventricular  size is normal.   3. Left atrial size was mildly dilated.   4. The mitral valve is normal in structure. Trivial mitral valve  regurgitation. No evidence of mitral stenosis.   5. 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 It has Cindia Hustead very short and fairly broad attachment to the leaflet and is  only slightly mobile. Diffierential diagnosis is broad and includes healed  vegetation, atypical myxoma,  papillary fibroelastoma and even degenerative change-related nodule. The  aortic valve is tricuspid. There is mild thickening of the aortic valve.  Aortic valve regurgitation is trivial. Aortic valve  sclerosis/calcification is present, without any evidence   of aortic stenosis.   6. The inferior vena cava is normal in size with greater than 50%  respiratory variability, suggesting right atrial pressure of 3 mmHg.   Comparison(s): Brilynn Biasi prior study was performed on 10/24/2021. No significant  change from prior study. Prior images reviewed side by side. [place] similar  abnormality was seen on the left cusp of the aortic valve. It was not seen  on TEE from 2018.   Conclusion(s)/Recommendation(s): Recommend TEE for evaluation of the  aortic valve abnormality.

## 2022-10-13 NOTE — Inpatient Diabetes Management (Signed)
Inpatient Diabetes Program Recommendations  AACE/ADA: New Consensus Statement on Inpatient Glycemic Control  Target Ranges:  Prepandial:   less than 140 mg/dL      Peak postprandial:   less than 180 mg/dL (1-2 hours)      Critically ill patients:  140 - 180 mg/dL    Latest Reference Range & Units 10/12/22 06:09 10/12/22 10:34 10/12/22 16:10 10/12/22 21:47 10/13/22 06:10 10/13/22 11:27  Glucose-Capillary 70 - 99 mg/dL 161 (H) 096 (H) 045 (H) 302 (H) 142 (H) 223 (H)   Review of Glycemic Control  Diabetes history: DM2 Outpatient Diabetes medications: Farxiga 10 mg daily,  Toujeo 14 units daily, Humalog 4 units TID, Victoza 1.2 mg daily Current orders for Inpatient glycemic control: Semglee 10 daily, Novolog 0-9 units TID with meals and 0-5 HS    Inpatient Diabetes Program Recommendations:    Insulin: Please consider ordering Novolog 3 units TID with meals for meal coverage if patient eats at least 50% of meals.  Thanks, Orlando Penner, RN, MSN, CDCES Diabetes Coordinator Inpatient Diabetes Program [phone] (Team Pager from 8am to 5pm)

## 2022-10-13 NOTE — Plan of Care (Signed)
Problem: Education: Goal: Understanding of cardiac disease, CV risk reduction, and recovery process will improve Outcome: Progressing Goal: Individualized Educational Video(s) Outcome: Progressing   Problem: Activity: Goal: Ability to tolerate increased activity will improve Outcome: Progressing   Problem: Cardiac: Goal: Ability to achieve and maintain adequate cardiovascular perfusion will improve Outcome: Progressing   Problem: Health Behavior/Discharge Planning: Goal: Ability to safely manage health-related needs after discharge will improve Outcome: Progressing   Problem: Education: Goal: Knowledge of disease or condition will improve Outcome: Progressing Goal: Knowledge of secondary prevention will improve (MUST DOCUMENT ALL) Outcome: Progressing Goal: Knowledge of patient specific risk factors will improve Loraine Leriche N/A or DELETE if not current risk factor) Outcome: Progressing   Problem: Ischemic Stroke/TIA Tissue Perfusion: Goal: Complications of ischemic stroke/TIA will be minimized Outcome: Progressing   Problem: Coping: Goal: Will verbalize positive feelings about self Outcome: Progressing Goal: Will identify appropriate support needs Outcome: Progressing   Problem: Health Behavior/Discharge Planning: Goal: Ability to manage health-related needs will improve Outcome: Progressing Goal: Goals will be collaboratively established with patient/family Outcome: Progressing   Problem: Self-Care: Goal: Ability to participate in self-care as condition permits will improve Outcome: Progressing Goal: Verbalization of feelings and concerns over difficulty with self-care will improve Outcome: Progressing Goal: Ability to communicate needs accurately will improve Outcome: Progressing   Problem: Nutrition: Goal: Risk of aspiration will decrease Outcome: Progressing Goal: Dietary intake will improve Outcome: Progressing   Problem: Education: Goal: Knowledge of General  Education information will improve Description: Including pain rating scale, medication(s)/side effects and non-pharmacologic comfort measures Outcome: Progressing   Problem: Health Behavior/Discharge Planning: Goal: Ability to manage health-related needs will improve Outcome: Progressing   Problem: Clinical Measurements: Goal: Ability to maintain clinical measurements within normal limits will improve Outcome: Progressing Goal: Will remain free from infection Outcome: Progressing Goal: Diagnostic test results will improve Outcome: Progressing Goal: Respiratory complications will improve Outcome: Progressing Goal: Cardiovascular complication will be avoided Outcome: Progressing   Problem: Activity: Goal: Risk for activity intolerance will decrease Outcome: Progressing   Problem: Nutrition: Goal: Adequate nutrition will be maintained Outcome: Progressing   Problem: Coping: Goal: Level of anxiety will decrease Outcome: Progressing   Problem: Elimination: Goal: Will not experience complications related to bowel motility Outcome: Progressing Goal: Will not experience complications related to urinary retention Outcome: Progressing   Problem: Pain Managment: Goal: General experience of comfort will improve Outcome: Progressing   Problem: Safety: Goal: Ability to remain free from injury will improve Outcome: Progressing   Problem: Skin Integrity: Goal: Risk for impaired skin integrity will decrease Outcome: Progressing   Problem: Education: Goal: Ability to describe self-care measures that may prevent or decrease complications (Diabetes Survival Skills Education) will improve Outcome: Progressing Goal: Individualized Educational Video(s) Outcome: Progressing   Problem: Coping: Goal: Ability to adjust to condition or change in health will improve Outcome: Progressing   Problem: Fluid Volume: Goal: Ability to maintain a balanced intake and output will  improve Outcome: Progressing   Problem: Health Behavior/Discharge Planning: Goal: Ability to identify and utilize available resources and services will improve Outcome: Progressing Goal: Ability to manage health-related needs will improve Outcome: Progressing   Problem: Metabolic: Goal: Ability to maintain appropriate glucose levels will improve Outcome: Progressing   Problem: Nutritional: Goal: Maintenance of adequate nutrition will improve Outcome: Progressing Goal: Progress toward achieving an optimal weight will improve Outcome: Progressing   Problem: Skin Integrity: Goal: Risk for impaired skin integrity will decrease Outcome: Progressing   Problem: Tissue Perfusion: Goal: Adequacy of  tissue perfusion will improve Outcome: Progressing

## 2022-10-13 NOTE — Progress Notes (Addendum)
STROKE TEAM PROGRESS NOTE   BRIEF HPI Sarah [name] is a 77 y.o. female with a PMHx of glaucoma, bilateral carpal tunnel syndrome, DM, HTN, HLD, CAD s/p stent placement, current loop recorder in place, adenomatous colonic polyps, large prior right MCA stroke with residual left hemiparesis and DM who initially presented to [facility] 9.30 d/t new-onset of right sided leg weakness with falls over the past 24 hours. LKN was 9/29 at 5 PM, at which time she first noticed the RLE weakness. She fell twice on Sunday and has also had additional falls after the first two. She denied any speech deficit, headache or vision changes. She was transferred from [facility] to the [facility] ED for MRI.  MRI brain reveals 3 punctate acute ischemic infarctions in the left MCA territory. Also noted on MRI is a large chronic right MCA stroke.   SIGNIFICANT HOSPITAL EVENTS   INTERIM HISTORY/SUBJECTIVE Patient sitting up in bed, family at bedside.  Stable neuro exam. No acute changes or new complaints. Discussed assessment and plan of care, including importance of medication compliance and PT/OT evals here in the hospital.     OBJECTIVE  CBC    Component Value Date/Time   WBC 8.5 10/12/2022 0613   RBC 3.62 (L) 10/12/2022 0613   HGB 11.0 (L) 10/12/2022 0613   HGB 12.1 03/04/2020 1025   HCT 33.1 (L) 10/12/2022 0613   HCT 37.3 03/04/2020 1025   PLT 318 10/12/2022 0613   PLT 342 03/04/2020 1025   MCV 91.4 10/12/2022 0613   MCV 94 03/04/2020 1025   MCH 30.4 10/12/2022 0613   MCHC 33.2 10/12/2022 0613   RDW 12.4 10/12/2022 0613   RDW 11.8 03/04/2020 1025   LYMPHSABS 2.3 10/11/2022 1902   MONOABS 0.6 10/11/2022 1902   EOSABS 0.1 10/11/2022 1902   BASOSABS 0.1 10/11/2022 1902    BMET    Component Value Date/Time   NA 140 10/12/2022 0613   NA 144 12/08/2021 1351   K 2.9 (L) 10/12/2022 0613   CL 110 10/12/2022 0613   CO2 24 10/12/2022 0613   GLUCOSE 184 (H) 10/12/2022 0613   BUN 10 10/12/2022 0613   BUN 15  12/08/2021 1351   CREATININE 0.97 10/12/2022 0613   CREATININE 0.55 (L) 10/24/2019 1602   CALCIUM 9.1 10/12/2022 0613   EGFR 72 12/08/2021 1351   GFRNONAA >60 10/12/2022 0613   GFRNONAA 94 10/24/2019 1602    IMAGING past 24 hours No results found.  Vitals:   10/12/22 1929 10/12/22 2350 10/13/22 0426 10/13/22 0737  BP: (!) 173/78 (!) 151/66 (!) 167/72 (!) 183/84  Pulse: 80 75 74 75  Resp: 16 16 16 17   Temp: 98.6 F (37 C) 98 F (36.7 C) 97.8 F (36.6 C) 98.7 F (37.1 C)  TempSrc: Oral Oral Oral Oral  SpO2: 96% 97% 99% 95%  Weight:      Height:       Physical Exam  GENERAL: Awake, alert in NAD HEENT: - Normocephalic and atraumatic, dry mm LUNGS - Unlabored, on room air CV - S1S2 RRR, no m/r/g ABDOMEN - Soft, nontender, non-distended Ext: warm, well perfused, intact peripheral pulses, no edema   NEURO:  MENTAL STATUS: awake, oriented to person, place, time, situation. Good attention. Able to give clear history.  LANG/SPEECH: Speech is mildly dysarthric (baseline per patient). Naming and repetition intact, fluent, follows complex commands.? CRANIAL NERVES:? II: Pupils equal and reactive, no RAPD, no VF deficits? III, IV, VI: EOM intact, no gaze preference or  deviation, no nystagmus.? V: normal sensation in V1, V2, and V3 segments bilaterally? VII: nasolabial fold flattening on the left with slight droop VIII: normal hearing to speech? IX, X: normal palatal elevation, no uvular deviation? XI: 5/5 head turn and 5/5 shoulder shrug bilaterally? XII: midline tongue protrusion? MOTOR:? 4+/5 R shoulder, elbow and 5/5 R wrist, grips.   4/5 in R hip, knee, ankle dorsiflexors and planter flexors. 5/5 L elbow flexors/extensors, wrist, grips.   5/5 in L hip, knee, ankle dorsiflexors and planter flexors.? SENSORY:?Normal to touch.?No extinction to double sided stimulation  COORDINATION: Slow but intact FNF and heel-to-shin, no ataxia GAIT: Deferred  ASSESSMENT/PLAN  Acute  Ischemic Infarct:  several small infarcts at left brain watershed areas and left cerebellum, etiology concerning for cardioembolic source. Code Stroke CT head No acute abnormality.  CTA head & neck: No emergent finding. Atherosclerosis with moderate to advanced bilateral MCA stenosis, worse on the right where there is chronic inferior division infarction. No flow reducing stenosis or embolic source seen in the neck.  MRI brain: Small acute infarcts scattered in the left cerebral hemisphere and left cerebellum. Extensive chronic small vessel disease and extensive chronic right cerebral cortex infarcts.  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 It has a very short and fairly broad attachment to the leaflet and is only slightly mobile. Diffierential diagnosis is broad and includes healed vegetation, atypical myxoma, papillary fibroelastoma and even degenerative change-related nodule.  TEE pending Loop Recorder Interrogation: No afib LDL 44 HgbA1c 12.2 VTE prophylaxis - SCDs clopidogrel 75 mg daily (noncompliance) prior to admission, now on ASA and brilinta for one month and then back to plavix Therapy recommendations:  Home Health PT and Home Health OT Disposition:  pending  Hx of Stroke/TIA Stroke in 10/2016 with left-sided weakness.  MRI showed right MCA scattered infarcts.  MRA showed right M2 occlusion.  CTA neck unremarkable.  Carotid Doppler right 40 to 59% stenosis.  EF 55 to 60%.  TEE unremarkable, LDL 98 and A1c 12.9.  Loop recorder placed.  Patient was discharged with DAPT and Lipitor 40. Still in 01/2018 with left hemianopia. CT as well as MRI showed right inferior MCA infarct.  MRA head and neck showed progression of intracranial stenosis from 2018 with right distal M1 and proximal M2 high-grade stenosis as well as left M1 moderate to severe stenosis. A1c 13.5 and LDL 76. EF 60 to 65%.  Loop recorder interrogated, no A. fib found.  Discharged on DAPT  and Lipitor 80. 10/2021 left cerebellum, left parietal infarcts, likely incidental finding in the setting of encephalopathy.  MRA showed bilateral M1 stenosis, particularly at once on the right at the location of her previous right MCA infarct.  Recommend 30-day CardioNet monitoring. 06/2022 follow-up with Dr. Nelly Laurence, new loop recorder placed Follows with Dr. Pearlean Brownie / Ihor Austin at [facility], residual deficit with mild left-sided weakness and slurred speech.  Hypertension Home meds: Amlodipine 5 mg, hydralazine 10 mg, Lasix 20 mg, losartan 50 mg Amlodipine restarted Stable BP goal gradual return to normotensive in 2 to 3 days Long-term BP goal normotensive  Hyperlipidemia Home meds: Crestor 20 mg, Zetia 10 mg, resumed in hospital LDL 44, goal < 70 Continue statin and Zetia at discharge  Diabetes type II Uncontrolled Home meds:  Farxiga 10 mg, insulin, insulin lispro, liraglutide HgbA1c 12.2, goal < 7.0 CBGs SSI Recommend close follow-up with PCP for better DM control  Other Stroke Risk Factors  Family hx stroke (father) Remote smoker Advanced age CAD status post stenting  Other acute issues AKI, creatinine 1.01-0 0.97-1.24   Hospital day # 1   Pt seen by Neuro NP/APP and later by MD. Note/plan to be edited by MD as needed.    Lynnae January, DNP, AGACNP-BC [facility] Please use AMION for contact information & EPIC for messaging.  ATTENDING NOTE: I reviewed above note and agree with the assessment and plan. Pt was seen and examined.   No family at bedside.  Patient reclining in bed, AOx3, no aphasia, follows simple commands.  No gaze palsy, visual field testing showed left lower quadrantanopsia.  Right facial droop, right upper extremity pronator drift.  Tongue midline, bilateral lower extremity at least 4/5.  Sensation symmetrical.  No ataxia.  Patient stroke involving multiple vascular territory, concerning for cardioembolic source.  2D echo showed echodensity  at left coronary cusp, pending TEE to further evaluate.  Loop recorder interrogation no A-fib.  Not compliant with Plavix at home, given recurrent stroke, now put on aspirin and Brilinta for 30 days and then back to Plavix.  Continue home statin and Zetia.  PT and OT recommend home health.  Will follow.  For detailed assessment and plan, please refer to above/below as I have made changes wherever appropriate.   Marvel Plan, MD PhD Stroke Neurology 10/13/2022 7:49 PM     To contact Stroke Continuity provider, please refer to [email or web address]. After hours, contact General Neurology

## 2022-10-14 ENCOUNTER — Encounter (HOSPITAL_COMMUNITY): Payer: Self-pay | Admitting: Neurology

## 2022-10-14 ENCOUNTER — Encounter (HOSPITAL_COMMUNITY)
Admission: EM | Disposition: A | Discharge status: Home Health | Payer: Self-pay | Source: Home / Self Care | Attending: Family Medicine

## 2022-10-14 ENCOUNTER — Inpatient Hospital Stay (HOSPITAL_COMMUNITY): Payer: Medicare Other | Admitting: Certified Registered Nurse Anesthetist

## 2022-10-14 ENCOUNTER — Inpatient Hospital Stay (HOSPITAL_COMMUNITY): Payer: Medicare Other

## 2022-10-14 DIAGNOSIS — I358 Other nonrheumatic aortic valve disorders: Secondary | ICD-10-CM | POA: Diagnosis not present

## 2022-10-14 DIAGNOSIS — I639 Cerebral infarction, unspecified: Secondary | ICD-10-CM | POA: Diagnosis not present

## 2022-10-14 DIAGNOSIS — R2990 Unspecified symptoms and signs involving the nervous system: Secondary | ICD-10-CM | POA: Diagnosis not present

## 2022-10-14 HISTORY — PX: TEE WITHOUT CARDIOVERSION: SHX5443

## 2022-10-14 LAB — GLUCOSE, CAPILLARY
Glucose-Capillary: 155 mg/dL — ABNORMAL HIGH (ref 70–99)
Glucose-Capillary: 159 mg/dL — ABNORMAL HIGH (ref 70–99)
Glucose-Capillary: 118 mg/dL — ABNORMAL HIGH (ref 70–99)
Glucose-Capillary: 253 mg/dL — ABNORMAL HIGH (ref 70–99)

## 2022-10-14 LAB — CBC WITH DIFFERENTIAL/PLATELET
Abs Immature Granulocytes: 0.03 10*3/uL (ref 0.00–0.07)
Basophils Absolute: 0 10*3/uL (ref 0.0–0.1)
Basophils Relative: 1 %
Eosinophils Absolute: 0.2 10*3/uL (ref 0.0–0.5)
Eosinophils Relative: 3 %
HCT: 32.4 % — ABNORMAL LOW (ref 36.0–46.0)
Hemoglobin: 10.8 g/dL — ABNORMAL LOW (ref 12.0–15.0)
Immature Granulocytes: 0 %
Lymphocytes Relative: 30 %
Lymphs Abs: 2.2 10*3/uL (ref 0.7–4.0)
MCH: 31.5 pg (ref 26.0–34.0)
MCHC: 33.3 g/dL (ref 30.0–36.0)
MCV: 94.5 fL (ref 80.0–100.0)
Monocytes Absolute: 0.5 10*3/uL (ref 0.1–1.0)
Monocytes Relative: 7 %
Neutro Abs: 4.5 10*3/uL (ref 1.7–7.7)
Neutrophils Relative %: 59 %
Platelets: 288 10*3/uL (ref 150–400)
RBC: 3.43 MIL/uL — ABNORMAL LOW (ref 3.87–5.11)
RDW: 12.3 % (ref 11.5–15.5)
WBC: 7.5 10*3/uL (ref 4.0–10.5)
nRBC: 0 % (ref 0.0–0.2)

## 2022-10-14 LAB — COMPREHENSIVE METABOLIC PANEL
ALT: 34 U/L (ref 0–44)
AST: 40 U/L (ref 15–41)
Albumin: 1.8 g/dL — ABNORMAL LOW (ref 3.5–5.0)
Alkaline Phosphatase: 61 U/L (ref 38–126)
Anion gap: 8 (ref 5–15)
BUN: 14 mg/dL (ref 8–23)
CO2: 23 mmol/L (ref 22–32)
Calcium: 8.6 mg/dL — ABNORMAL LOW (ref 8.9–10.3)
Chloride: 107 mmol/L (ref 98–111)
Creatinine, Ser: 1.09 mg/dL — ABNORMAL HIGH (ref 0.44–1.00)
GFR, Estimated: 53 mL/min — ABNORMAL LOW (ref >60)
Glucose, Bld: 155 mg/dL — ABNORMAL HIGH (ref 70–99)
Potassium: 3 mmol/L — ABNORMAL LOW (ref 3.5–5.1)
Sodium: 138 mmol/L (ref 135–145)
Total Bilirubin: 0.3 mg/dL (ref 0.3–1.2)
Total Protein: 4.9 g/dL — ABNORMAL LOW (ref 6.5–8.1)

## 2022-10-14 LAB — PHOSPHORUS: Phosphorus: 3.5 mg/dL (ref 2.5–4.6)

## 2022-10-14 LAB — ECHO TEE

## 2022-10-14 LAB — MAGNESIUM: Magnesium: 2.1 mg/dL (ref 1.7–2.4)

## 2022-10-14 SURGERY — ECHOCARDIOGRAM, TRANSESOPHAGEAL
Anesthesia: Monitor Anesthesia Care

## 2022-10-14 MED ORDER — POTASSIUM CHLORIDE CRYS ER 20 MEQ PO TBCR
40.0000 meq | EXTENDED_RELEASE_TABLET | ORAL | Status: AC
  Administered 2022-10-14: 40 meq via ORAL
  Filled 2022-10-14: qty 2

## 2022-10-14 MED ORDER — PROPOFOL 500 MG/50ML IV EMUL
INTRAVENOUS | Status: DC | PRN
  Administered 2022-10-14: 150 ug/kg/min via INTRAVENOUS

## 2022-10-14 MED ORDER — SODIUM CHLORIDE 0.9 % IV SOLN
INTRAVENOUS | Status: DC
  Administered 2022-10-14: 20 mL/h via INTRAVENOUS

## 2022-10-14 MED ORDER — PHENYLEPHRINE HCL (PRESSORS) 10 MG/ML IV SOLN
INTRAVENOUS | Status: DC | PRN
Start: 2022-10-14 — End: 2022-10-14
  Administered 2022-10-14 (×2): 80 ug via INTRAVENOUS

## 2022-10-14 MED ORDER — PROPOFOL 10 MG/ML IV BOLUS
INTRAVENOUS | Status: DC | PRN
  Administered 2022-10-14: 60 mg via INTRAVENOUS

## 2022-10-14 NOTE — Progress Notes (Signed)
  Echocardiogram Echocardiogram Transesophageal has been performed.  Janalyn Harder 10/14/2022, 2:32 PM

## 2022-10-14 NOTE — Plan of Care (Signed)
  Problem: Activity: Goal: Risk for activity intolerance will decrease Outcome: Progressing   Problem: Nutrition: Goal: Adequate nutrition will be maintained Outcome: Progressing   Problem: Coping: Goal: Level of anxiety will decrease Outcome: Progressing   Problem: Elimination: Goal: Will not experience complications related to bowel motility Outcome: Progressing   Problem: Elimination: Goal: Will not experience complications related to urinary retention Outcome: Progressing   Problem: Pain Managment: Goal: General experience of comfort will improve Outcome: Progressing   Problem: Safety: Goal: Ability to remain free from injury will improve Outcome: Progressing   

## 2022-10-14 NOTE — Anesthesia Postprocedure Evaluation (Signed)
Anesthesia Post Note  Patient: BETUL BRISKY  Procedure(s) Performed: TRANSESOPHAGEAL ECHOCARDIOGRAM     Patient location during evaluation: PACU Anesthesia Type: MAC Level of consciousness: awake and alert Pain management: pain level controlled Vital Signs Assessment: post-procedure vital signs reviewed and stable Respiratory status: spontaneous breathing, nonlabored ventilation, respiratory function stable and patient connected to nasal cannula oxygen Cardiovascular status: stable and blood pressure returned to baseline Postop Assessment: no apparent nausea or vomiting Anesthetic complications: no   No notable events documented.  Last Vitals:  Vitals:   10/14/22 1449 10/14/22 1452  BP: (!) 189/76 (!) 135/110  Pulse:  72  Resp:    Temp:    SpO2:      Last Pain:  Vitals:   10/14/22 1447  TempSrc: Temporal  PainSc: 0-No pain                 Earl Lites P Apurva Reily

## 2022-10-14 NOTE — Interval H&P Note (Signed)
History and Physical Interval Note:  10/14/2022 1:38 PM  Sarah [name]  has presented today for surgery, with the diagnosis of stroke.  The various methods of treatment have been discussed with the patient and family. After consideration of risks, benefits and other options for treatment, the patient has consented to  Procedure(s): TRANSESOPHAGEAL ECHOCARDIOGRAM (N/A) as a surgical intervention.  The patient's history has been reviewed, patient examined, no change in status, stable for surgery.  I have reviewed the patient's chart and labs.  Questions were answered to the patient's satisfaction.     Olga Millers

## 2022-10-14 NOTE — Progress Notes (Signed)
    [facility] has been requested to perform a transesophageal echocardiogram on [name] for evaluation of "hyperechoic mass on L coronary cusp" in the setting of recent CVA.  After careful review of history and examination, the risks and benefits of transesophageal echocardiogram have been explained including risks of esophageal damage, perforation (1:10,000 risk), bleeding, pharyngeal hematoma as well as other potential complications associated with conscious sedation including aspiration, arrhythmia, respiratory failure and death. Alternatives to treatment were discussed, questions were answered. Patient is willing to proceed.   Perlie Gold PA-C 10/14/2022 11:46 AM

## 2022-10-14 NOTE — Progress Notes (Signed)
Physical Therapy Treatment Patient Details Name: Sarah [name] MRN: [id number] DOB: 08-03-1945 Today's Date: 10/14/2022   History of Present Illness Sarah [name] is a 77 y.o. female who presented after 2 days of RLE weakness and falls. MRI with small scattered acute infarcts along the posterior left frontal cortex/white matter in the left occipital cortex, and in the lower left cerebellum. PMHx: CVA with residual left-sided hemiparesis, hypertension, hyperlipidemia, diabetes, CAD    PT Comments  Tolerated treatment well, increased ambulatory distance and tolerance >150 feet today at CGA level using RW for support. Still mild hemiparetic type gait, needs cues for Rt step length and foot clearance intermittently. Worked on Risk analyst. Cues for sequencing, awareness of foot placement, and rail for support. (Pt has flight of stairs to bedroom but can stay on main floor if necessary.) Patient will continue to benefit from skilled physical therapy services to further improve independence with functional mobility.     If plan is discharge home, recommend the following: A little help with walking and/or transfers;Direct supervision/assist for medications management;Supervision due to cognitive status;Direct supervision/assist for financial management;Assistance with cooking/housework;Assist for transportation;A little help with bathing/dressing/bathroom;Help with stairs or ramp for entrance   Can travel by private vehicle        Equipment Recommendations  Rolling walker (2 wheels)    Recommendations for Other Services       Precautions / Restrictions Precautions Precautions: Fall Restrictions Weight Bearing Restrictions: No     Mobility  Bed Mobility Overal bed mobility: Needs Assistance Bed Mobility: Supine to Sit     Supine to sit: Supervision, Used rails, HOB elevated     General bed mobility comments: Supervision for safety, cues for sequencing, extra  time, effortful with use of rail to rise.    Transfers Overall transfer level: Needs assistance Equipment used: Rolling walker (2 wheels) Transfers: Sit to/from Stand Sit to Stand: Contact guard assist           General transfer comment: CGA for safety, cues for hand placement. Performed from bed and toilet without physical assist. RW for support upon rising.    Ambulation/Gait Ambulation/Gait assistance: Contact guard assist Gait Distance (Feet): 165 Feet Assistive device: Rolling walker (2 wheels) Gait Pattern/deviations: Step-through pattern, Decreased step length - right, Decreased step length - left, Trunk flexed, Drifts right/left, Shuffle, Step-to pattern, Decreased stride length, Decreased dorsiflexion - right Gait velocity: decreased Gait velocity interpretation: <1.31 ft/sec, indicative of household ambulator   General Gait Details: Reviewed RW use, placement, and proximity of body to device. Requires intermittent VC for awareness, Rt step length, and for increased foot clearance. Drifting towards left but able to correct before bumping into objects, poor anticipatory awareness through. CGA for safety.   Stairs Stairs: Yes Stairs assistance: Contact guard assist Stair Management: One rail Right, One rail Left, Step to pattern, Sideways, Forwards Number of Stairs: 5 (x2) General stair comments: Single rail for support, alternated between Lt and Rt rail, sideways and forward approach. Appears more stabile with BIL hands to rail, stepping sideways to navigate steps. No overt buckling, but some difficulty with RLE placement. Needs cues for sequencing and technique.   Wheelchair Mobility     Tilt Bed    Modified Rankin (Stroke Patients Only) Modified Rankin (Stroke Patients Only) Pre-Morbid Rankin Score: Slight disability Modified Rankin: Moderately severe disability     Balance Overall balance assessment: History of Falls, Needs assistance Sitting-balance  support: Feet supported, No upper extremity supported Sitting balance-Leahy Scale:  Fair     Standing balance support: No upper extremity supported, During functional activity Standing balance-Leahy Scale: Fair Standing balance comment: Stands at sink to wash hands                            Cognition Arousal: Alert Behavior During Therapy: WFL for tasks assessed/performed Overall Cognitive Status: Impaired/Different from baseline Area of Impairment: Safety/judgement, Problem solving                         Safety/Judgement: Decreased awareness of deficits, Decreased awareness of safety Awareness: Emergent Problem Solving: Slow processing, Requires verbal cues          Exercises      General Comments General comments (skin integrity, edema, etc.): VSS on RA      Pertinent Vitals/Pain Pain Assessment Pain Assessment: No/denies pain    Home Living                          Prior Function            PT Goals (current goals can now be found in the care plan section) Acute Rehab PT Goals Patient Stated Goal: go home PT Goal Formulation: With patient Time For Goal Achievement: 10/26/22 Potential to Achieve Goals: Good Progress towards PT goals: Progressing toward goals    Frequency    Min 1X/week      PT Plan      Co-evaluation              AM-PAC PT "6 Clicks" Mobility   Outcome Measure  Help needed turning from your back to your side while in a flat bed without using bedrails?: A Little Help needed moving from lying on your back to sitting on the side of a flat bed without using bedrails?: A Little Help needed moving to and from a bed to a chair (including a wheelchair)?: A Little Help needed standing up from a chair using your arms (e.g., wheelchair or bedside chair)?: A Little Help needed to walk in hospital room?: A Little Help needed climbing 3-5 steps with a railing? : A Lot 6 Click Score: 17    End of  Session Equipment Utilized During Treatment: Gait belt Activity Tolerance: Patient tolerated treatment well Patient left: with call bell/phone within reach;with bed alarm set;in bed;Other (comment);with SCD's reapplied (NP from neuro speaking withp t.) Nurse Communication: Mobility status PT Visit Diagnosis: Unsteadiness on feet (R26.81);Difficulty in walking, not elsewhere classified (R26.2);Muscle weakness (generalized) (M62.81);History of falling (Z91.81);Hemiplegia and hemiparesis Hemiplegia - Right/Left: Right Hemiplegia - caused by: Cerebral infarction     Time: 2130-8657 PT Time Calculation (min) (ACUTE ONLY): 22 min  Charges:    $Gait Training: 8-22 mins PT General Charges $$ ACUTE PT VISIT: 1 Visit                     Kathlyn Sacramento, PT, DPT [facility] Physical Therapist Office: [phone] Website: [email or web address]    Berton Mount 10/14/2022, 12:51 PM

## 2022-10-14 NOTE — Progress Notes (Addendum)
STROKE TEAM PROGRESS NOTE   BRIEF HPI Sarah [name] is a 77 y.o. female with a PMHx of glaucoma, bilateral carpal tunnel syndrome, DM, HTN, HLD, CAD s/p stent placement, current loop recorder in place, adenomatous colonic polyps, large prior right MCA stroke with residual left hemiparesis and DM who initially presented to [facility] 9.30 d/t new-onset of right sided leg weakness with falls over the past 24 hours. LKN was 9/29 at 5 PM, at which time she first noticed the RLE weakness. She fell twice on Sunday and has also had additional falls after the first two. She denied any speech deficit, headache or vision changes. She was transferred from [facility] to the [facility] ED for MRI.  MRI brain reveals 3 punctate acute ischemic infarctions in the left MCA territory. Also noted on MRI is a large chronic right MCA stroke.   SIGNIFICANT HOSPITAL EVENTS 10/3: TEE shows "mobile density right coronary cusp (possible vegetation vs lambl\'s excrescence)"  PENDING Cardiology recommendations.   INTERIM HISTORY/SUBJECTIVE Patient sitting up in bed, family at bedside.  Stable neuro exam. No acute changes or new complaints.  Left voicemail for patients sister [name], patient requested that she be updated.   OBJECTIVE  CBC    Component Value Date/Time   WBC 7.5 10/14/2022 0723   RBC 3.43 (L) 10/14/2022 0723   HGB 10.8 (L) 10/14/2022 0723   HGB 12.1 03/04/2020 1025   HCT 32.4 (L) 10/14/2022 0723   HCT 37.3 03/04/2020 1025   PLT 288 10/14/2022 0723   PLT 342 03/04/2020 1025   MCV 94.5 10/14/2022 0723   MCV 94 03/04/2020 1025   MCH 31.5 10/14/2022 0723   MCHC 33.3 10/14/2022 0723   RDW 12.3 10/14/2022 0723   RDW 11.8 03/04/2020 1025   LYMPHSABS 2.2 10/14/2022 0723   MONOABS 0.5 10/14/2022 0723   EOSABS 0.2 10/14/2022 0723   BASOSABS 0.0 10/14/2022 0723    BMET    Component Value Date/Time   NA 138 10/14/2022 0723   NA 144 12/08/2021 1351   K 3.0 (L) 10/14/2022 0723   CL 107 10/14/2022 0723   CO2 23  10/14/2022 0723   GLUCOSE 155 (H) 10/14/2022 0723   BUN 14 10/14/2022 0723   BUN 15 12/08/2021 1351   CREATININE 1.09 (H) 10/14/2022 0723   CREATININE 0.55 (L) 10/24/2019 1602   CALCIUM 8.6 (L) 10/14/2022 0723   EGFR 72 12/08/2021 1351   GFRNONAA 53 (L) 10/14/2022 0723   GFRNONAA 94 10/24/2019 1602    IMAGING past 24 hours EP STUDY  Result Date: 10/14/2022 See surgical note for result.  ECHOCARDIOGRAM COMPLETE  Result Date: 10/13/2022    ECHOCARDIOGRAM REPORT   Patient Name:   Sarah [name] Date of Exam: 10/13/2022 Medical Rec #:  [id number]            Height:       60.0 in Accession #:    [id number]           Weight:       112.4 lb Date of Birth:  03/15/1945            BSA:          1.461 m Patient Age:    77 years             BP:           10 8/80 mmHg Patient Gender: F  HR:           77 bpm. Exam Location:  Inpatient Procedure: 2D Echo, Cardiac Doppler and Color Doppler Indications:    Stroke I63.9  History:        Patient has prior history of Echocardiogram examinations, most                 recent 10/24/2021. Hypertrophic Cardiomyopathy, CAD, Abnormal                 ECG, Carotid Disease; Risk Factors:Diabetes, Dyslipidemia,                 Hypertension and Former Smoker.  Sonographer:    Dondra Prader RVT RCS Referring Phys: [id number] JONATHAN SEGARS IMPRESSIONS  1. Left ventricular ejection fraction, by estimation, is 65 to 70%. The left ventricle has normal function. The left ventricle has no regional wall motion abnormalities. There is mild concentric left ventricular hypertrophy. Left ventricular diastolic parameters are consistent with Grade I diastolic dysfunction (impaired relaxation).  2. Right ventricular systolic function is normal. The right ventricular size is normal.  3. Left atrial size was mildly dilated.  4. The mitral valve is normal in structure. Trivial mitral valve regurgitation. No evidence of mitral stenosis.  5. There is a hyperechoic mass on the left  coronary cusp measuring 4 x 7 mm. It has a very short and fairly broad attachment to the leaflet and is only slightly mobile. Diffierential diagnosis is broad and includes healed vegetation, atypical myxoma, papillary fibroelastoma and even degenerative change-related nodule. The aortic valve is tricuspid. There is mild thickening of the aortic valve. Aortic valve regurgitation is trivial. Aortic valve sclerosis/calcification is present, without any evidence  of aortic stenosis.  6. The inferior vena cava is normal in size with greater than 50% respiratory variability, suggesting right atrial pressure of 3 mmHg. Comparison(s): A prior study was performed on 10/24/2021. No significant change from prior study. Prior images reviewed side by side. A similar abnormality was seen on the left cusp of the aortic valve. It was not seen on TEE from 2018. Conclusion(s)/Recommendation(s): Recommend TEE for evaluation of the aortic valve abnormality. FINDINGS  Left Ventricle: Left ventricular ejection fraction, by estimation, is 65 to 70%. The left ventricle has normal function. The left ventricle has no regional wall motion abnormalities. The left ventricular internal cavity size was normal in size. There is  mild concentric left ventricular hypertrophy. Left ventricular diastolic parameters are consistent with Grade I diastolic dysfunction (impaired relaxation). Right Ventricle: The right ventricular size is normal. No increase in right ventricular wall thickness. Right ventricular systolic function is normal. Left Atrium: Left atrial size was mildly dilated. Right Atrium: Right atrial size was normal in size. Pericardium: There is no evidence of pericardial effusion. Mitral Valve: The mitral valve is normal in structure. Trivial mitral valve regurgitation. No evidence of mitral valve stenosis. Tricuspid Valve: The tricuspid valve is normal in structure. Tricuspid valve regurgitation is mild. Aortic Valve: There is a  hyperechoic mass on the left coronary cusp measuring 4 x 7 mm. It has a very short and fairly broad attachment to the leaflet and is only slightly mobile. Diffierential diagnosis is broad and includes healed vegetation, atypical myxoma, papillary fibroelastoma and even degenerative change-related nodule. The aortic valve is tricuspid. There is mild thickening of the aortic valve. Aortic valve regurgitation is trivial. Aortic valve sclerosis/calcification is present, without any evidence of aortic stenosis. Aortic valve mean gradient measures 6.0 mmHg. Aortic valve peak  gradient measures 11.0 mmHg. Aortic valve area, by VTI measures 2.26 cm. Pulmonic Valve: The pulmonic valve was normal in structure. Pulmonic valve regurgitation is not visualized. No evidence of pulmonic stenosis. Aorta: The aortic root and ascending aorta are structurally normal, with no evidence of dilitation. Venous: The inferior vena cava is normal in size with greater than 50% respiratory variability, suggesting right atrial pressure of 3 mmHg. IAS/Shunts: No atrial level shunt detected by color flow Doppler.  LEFT VENTRICLE PLAX 2D LVIDd:         3.20 cm   Diastology LVIDs:         2.00 cm   LV e' medial:    5.44 cm/s LV PW:         1.20 cm   LV E/e' medial:  17.2 LV IVS:        1.20 cm   LV e' lateral:   8.92 cm/s LVOT diam:     1.70 cm   LV E/e' lateral: 10.5 LV SV:         71 LV SV Index:   48 LVOT Area:     2.27 cm  RIGHT VENTRICLE             IVC RV S prime:     16.10 cm/s  IVC diam: 1.30 cm TAPSE (M-mode): 2.1 cm LEFT ATRIUM             Index        RIGHT ATRIUM          Index LA diam:        3.70 cm 2.53 cm/m   RA Area:     8.92 cm LA Vol (A2C):   45.2 ml 30.93 ml/m  RA Volume:   18.20 ml 12.45 ml/m LA Vol (A4C):   37.5 ml 25.66 ml/m LA Biplane Vol: 42.1 ml 28.81 ml/m  AORTIC VALVE                     PULMONIC VALVE AV Area (Vmax):    2.20 cm      PV Vmax:       1.07 m/s AV Area (Vmean):   2.25 cm      PV Peak grad:  4.6 mmHg  AV Area (VTI):     2.26 cm AV Vmax:           166.00 cm/s AV Vmean:          112.000 cm/s AV VTI:            0.312 m AV Peak Grad:      11.0 mmHg AV Mean Grad:      6.0 mmHg LVOT Vmax:         161.00 cm/s LVOT Vmean:        111.000 cm/s LVOT VTI:          0.311 m LVOT/AV VTI ratio: 1.00  AORTA Ao Root diam: 2.80 cm Ao Asc diam:  3.00 cm MITRAL VALVE                TRICUSPID VALVE MV Area (PHT): 3.77 cm     TR Peak grad:   28.1 mmHg MV Decel Time: 201 msec     TR Vmax:        265.00 cm/s MV E velocity: 93.60 cm/s MV A velocity: 112.00 cm/s  SHUNTS MV E/A ratio:  0.84         Systemic VTI:  0.31 m  Systemic Diam: 1.70 cm Thurmon Fair MD Electronically signed by Thurmon Fair MD Signature Date/Time: 10/13/2022/6:14:21 PM    Final     Vitals:   10/14/22 1436 10/14/22 1447 10/14/22 1449 10/14/22 1452  BP: (!) 151/71  (!) 189/76 (!) 135/110  Pulse:    72  Resp:      Temp:  97.9 F (36.6 C)    TempSrc:  Temporal    SpO2:      Weight:      Height:       Physical Exam  GENERAL: Awake, alert in NAD HEENT: - Normocephalic and atraumatic, dry mm LUNGS - Unlabored, on room air CV - S1S2 RRR, no m/r/g ABDOMEN - Soft, nontender, non-distended Ext: warm, well perfused, intact peripheral pulses, no edema   NEURO:  MENTAL STATUS: awake, oriented to person, place, time, situation. Good attention. Able to give clear history.  LANG/SPEECH: Speech is mildly dysarthric (baseline per patient). Naming and repetition intact, fluent, follows complex commands.? CRANIAL NERVES:? II: Pupils equal and reactive, no RAPD, no VF deficits? III, IV, VI: EOM intact, no gaze preference or deviation, no nystagmus.? V: normal sensation in V1, V2, and V3 segments bilaterally? VII: nasolabial fold flattening on the left with slight droop VIII: normal hearing to speech? IX, X: normal palatal elevation, no uvular deviation? XI: 5/5 head turn and 5/5 shoulder shrug bilaterally? XII: midline tongue  protrusion? MOTOR:? 4+/5 R shoulder, elbow and 5/5 R wrist, grips.   4/5 in R hip, knee, ankle dorsiflexors and planter flexors. 5/5 L elbow flexors/extensors, wrist, grips.   5/5 in L hip, knee, ankle dorsiflexors and planter flexors.? SENSORY:?Normal to touch.?No extinction to double sided stimulation  COORDINATION: Slow but intact FNF and heel-to-shin, no ataxia GAIT: walking in hallway with stand-by assistance, no DME needed  ASSESSMENT/PLAN  Acute Ischemic Infarct:  several small infarcts at left brain watershed areas and left cerebellum, etiology concerning for cardioembolic source. Code Stroke CT head No acute abnormality.  CTA head & neck: No emergent finding. Atherosclerosis with moderate to advanced bilateral MCA stenosis, worse on the right where there is chronic inferior division infarction. No flow reducing stenosis or embolic source seen in the neck.  MRI brain: Small acute infarcts scattered in the left cerebral hemisphere and left cerebellum. Extensive chronic small vessel disease and extensive chronic right cerebral cortex infarcts.  2D Echo EF 65-70%, hyperechoic mass on the left coronary cusp measuring 4 x 7mm. It has a very short and fairly broad attachment to the leaflet and is only slightly mobile. Diffierential diagnosis is broad and includes healed vegetation, atypical myxoma, papillary fibroelastoma and even degenerative change-related nodule.  TEE: nodular density left coronary cusp (possible nodular calcification); mobile density right coronary cusp (possible vegetation vs lambl's excrescence) Loop Recorder Interrogation: No afib LDL 44 HgbA1c 12.2 VTE prophylaxis - [facility] clopidogrel 75 mg daily (noncompliance) prior to admission, now on ASA and brilinta for one month and then back to plavix Therapy recommendations:  Home Health PT and OT Disposition:  pending  Cardiac mass 2D Echo EF 65-70%, hyperechoic mass on the left coronary cusp measuring 4 x 7mm. It has a  very short and fairly broad attachment to the leaflet and is only slightly mobile. Diffierential diagnosis is broad and includes healed vegetation, atypical myxoma, papillary fibroelastoma and even degenerative change-related nodule.  TEE: nodular density left coronary cusp (possible nodular calcification); mobile density right coronary cusp (possible vegetation vs lambl's excrescence) Will need cardiology input  Hx of Stroke/TIA  Stroke in 10/2016 with left-sided weakness.  MRI showed right MCA scattered infarcts.  MRA showed right M2 occlusion.  CTA neck unremarkable.  Carotid Doppler right 40 to 59% stenosis.  EF 55 to 60%.  TEE unremarkable, LDL 98 and A1c 12.9.  Loop recorder placed.  Patient was discharged with DAPT and Lipitor 40. Still in 01/2018 with left hemianopia. CT as well as MRI showed right inferior MCA infarct.  MRA head and neck showed progression of intracranial stenosis from 2018 with right distal M1 and proximal M2 high-grade stenosis as well as left M1 moderate to severe stenosis. A1c 13.5 and LDL 76. EF 60 to 65%.  Loop recorder interrogated, no A. fib found.  Discharged on DAPT and Lipitor 80. 10/2021 left cerebellum, left parietal infarcts, likely incidental finding in the setting of encephalopathy.  MRA showed bilateral M1 stenosis, particularly at once on the right at the location of her previous right MCA infarct.  Recommend 30-day CardioNet monitoring. 06/2022 follow-up with Dr. Nelly Laurence, new loop recorder placed Follows with Dr. Pearlean Brownie / Ihor Austin at [facility], residual deficit with mild left-sided weakness and slurred speech.  Hypertension Home meds: Amlodipine 5 mg, hydralazine 10 mg, Lasix 20 mg, losartan 50 mg Amlodipine restarted Stable BP goal gradual return to normotensive in 2 to 3 days Long-term BP goal normotensive  Hyperlipidemia Home meds: Crestor 20 mg, Zetia 10 mg, resumed in hospital LDL 44, goal < 70 Continue statin and Zetia at discharge  Diabetes type II  Uncontrolled Home meds:  Farxiga 10 mg, insulin, insulin lispro, liraglutide HgbA1c 12.2, goal < 7.0 CBGs SSI Recommend close follow-up with PCP for better DM control  Other Stroke Risk Factors Family hx stroke (father) Remote smoker Advanced age CAD status post stenting  Other acute issues AKI, creatinine 1.01-0 0.97-1.24-1.09 Resulted Hypocalcemia with hypoalbuminemia 8.6 -> corrected to 10.4  Hospital day # 2   Pt seen by Neuro NP/APP and later by MD. Note/plan to be edited by MD as needed.    Lynnae January, DNP, AGACNP-BC [facility] Please use AMION for contact information & EPIC for messaging.  ATTENDING NOTE: I reviewed above note and agree with the assessment and plan. Pt was seen and examined.   No family at the bedside. Pt sitting in bed, AAO x 3, able to tell me her family's phone number from memory. Moving all extremities. TEE done showed right coronary cusp mobile density. Pending cardiology input. Continue ASA and brilinta and statin and zetia for now. Will follow  For detailed assessment and plan, please refer to above/below as I have made changes wherever appropriate.   Marvel Plan, MD PhD Stroke Neurology 10/14/2022 6:03 PM    To contact Stroke Continuity provider, please refer to [email or web address]. After hours, contact General Neurology

## 2022-10-14 NOTE — Progress Notes (Signed)
Occupational Therapy Treatment Patient Details Name: Sarah [name] MRN: [id number] DOB: 08-Oct-1945 Today's Date: 10/14/2022   History of present illness Sarah [name] is a 77 y.o. female who presented after 2 days of RLE weakness and falls. MRI with small scattered acute infarcts along the posterior left frontal cortex/white matter in the left occipital cortex, and in the lower left cerebellum. PMHx: CVA with residual left-sided hemiparesis, hypertension, hyperlipidemia, diabetes, CAD   OT comments  Pt is making fair progress towards their acute OT goals. Overall her cognition and awareness was improved this date, however she continued to need cues for RW management. She did not need physical assist throughout session, CGA provided for all OOB activity for safety. OT to continue to follow acutely to facilitate progress towards established goals. Pt will continue to benefit from [facility].        If plan is discharge home, recommend the following:  A little help with walking and/or transfers;A little help with bathing/dressing/bathroom;Assistance with cooking/housework;Direct supervision/assist for medications management;Direct supervision/assist for financial management;Assist for transportation;Help with stairs or ramp for entrance         Precautions / Restrictions Precautions Precautions: Fall Restrictions Weight Bearing Restrictions: No       Mobility Bed Mobility Overal bed mobility: Needs Assistance Bed Mobility: Sit to Supine, Supine to Sit     Supine to sit: Supervision Sit to supine: Supervision        Transfers Overall transfer level: Needs assistance Equipment used: Rolling walker (2 wheels) Transfers: Sit to/from Stand Sit to Stand: Contact guard assist                 Balance Overall balance assessment: History of Falls, Needs assistance Sitting-balance support: Feet supported, No upper extremity supported Sitting balance-Leahy Scale: Fair      Standing balance support: Single extremity supported, During functional activity Standing balance-Leahy Scale: Fair           ADL either performed or assessed with clinical judgement   ADL Overall ADL's : Needs assistance/impaired     Grooming: Contact guard assist;Standing Grooming Details (indicate cue type and reason): at the sink                 Toilet Transfer: Contact guard assist;Ambulation;Rolling walker (2 wheels)           Functional mobility during ADLs: Contact guard assist;Rolling walker (2 wheels) General ADL Comments: no physcial assist needed, cues fro safety and RW mgmt    Extremity/Trunk Assessment Upper Extremity Assessment Upper Extremity Assessment: Generalized weakness LUE Deficits / Details: slowed and deliberate FM coordination   Lower Extremity Assessment Lower Extremity Assessment: Defer to PT evaluation        Vision   Vision Assessment?: No apparent visual deficits   Perception Perception Perception: Not tested   Praxis Praxis Praxis: Not tested    Cognition Arousal: Alert Behavior During Therapy: WFL for tasks assessed/performed Overall Cognitive Status: Impaired/Different from baseline Area of Impairment: Safety/judgement, Problem solving               Safety/Judgement: Decreased awareness of deficits, Decreased awareness of safety   Problem Solving: Slow processing, Requires verbal cues General Comments: pt able to recall NPO status and procedure this date; follows all commands with increased time. Continues to need cues for safe management of RW              General Comments VSS on RA    Pertinent Vitals/ Pain  Pain Assessment Pain Assessment: No/denies pain         Frequency  Min 1X/week        Progress Toward Goals  OT Goals(current goals can now be found in the care plan section)  Progress towards OT goals: Progressing toward goals  Acute Rehab OT Goals Patient Stated Goal: to  eat OT Goal Formulation: With patient Time For Goal Achievement: 10/26/22 Potential to Achieve Goals: Good ADL Goals Pt Will Perform Grooming: with modified independence;standing Pt Will Perform Lower Body Dressing: with modified independence;sit to/from stand Pt Will Transfer to Toilet: with modified independence;ambulating Additional ADL Goal #1: Pt will navigate hospital environment with RW and mod I to demonstrate reduces risk of falls at dischrage   AM-PAC OT "6 Clicks" Daily Activity     Outcome Measure   Help from another person eating meals?: None Help from another person taking care of personal grooming?: A Little Help from another person toileting, which includes using toliet, bedpan, or urinal?: A Little Help from another person bathing (including washing, rinsing, drying)?: A Little Help from another person to put on and taking off regular upper body clothing?: A Little Help from another person to put on and taking off regular lower body clothing?: A Little 6 Click Score: 19    End of Session Equipment Utilized During Treatment: Gait belt;Rolling walker (2 wheels)  OT Visit Diagnosis: Unsteadiness on feet (R26.81);Other abnormalities of gait and mobility (R26.89);Muscle weakness (generalized) (M62.81)   Activity Tolerance Patient tolerated treatment well   Patient Left in bed;with call bell/phone within reach;with bed alarm set;with family/visitor present   Nurse Communication Mobility status        Time: 5409-8119 OT Time Calculation (min): 14 min  Charges: OT General Charges $OT Visit: 1 Visit OT Treatments $Therapeutic Activity: 8-22 mins  Derenda Mis, OTR/L Acute Rehabilitation Services Office [phone] Secure Chat Communication Preferred   Donia Pounds 10/14/2022, 9:45 AM

## 2022-10-14 NOTE — Anesthesia Preprocedure Evaluation (Signed)
Anesthesia Evaluation  Patient identified by MRN, date of birth, ID band Patient awake    Reviewed: Allergy & Precautions, NPO status , Patient's Chart, lab work & pertinent test results  Airway Mallampati: II  TM Distance: >3 FB Neck ROM: Full    Dental no notable dental hx.    Pulmonary neg pulmonary ROS, former smoker   Pulmonary exam normal        Cardiovascular hypertension, Pt. on medications + CAD   Rhythm:Regular Rate:Normal  ECHO:   1. Left ventricular ejection fraction, by estimation, is 65 to 70%. The left ventricle has normal function. The left ventricle has no regional wall motion abnormalities. There is mild concentric left ventricular hypertrophy. Left ventricular diastolic parameters are consistent with Grade I diastolic dysfunction (impaired relaxation).  2. Right ventricular systolic function is normal. The right ventricular size is normal.  3. Left atrial size was mildly dilated.  4. The mitral valve is normal in structure. Trivial mitral valve regurgitation. No evidence of mitral stenosis.  5. There is a hyperechoic mass on the left coronary cusp measuring 4 x 7 mm. It has a very short and fairly broad attachment to the leaflet and is only slightly mobile. Diffierential diagnosis is broad and includes healed vegetation, atypical myxoma, papillary fibroelastoma and even degenerative change-related nodule. The aortic valve is tricuspid. There is mild thickening of the aortic valve. Aortic valve regurgitation is trivial. Aortic valve sclerosis/calcification is present, without any evidence  of aortic stenosis.  6. The inferior vena cava is normal in size with greater than 50% respiratory variability, suggesting right atrial pressure of 3 mmHg.    Neuro/Psych   Anxiety     CVA    GI/Hepatic Neg liver ROS,GERD  ,,  Endo/Other  diabetes, Type 2, Insulin Dependent    Renal/GU   negative genitourinary    Musculoskeletal  (+) Arthritis , Osteoarthritis,    Abdominal Normal abdominal exam  (+)   Peds  Hematology  (+) Blood dyscrasia, anemia   Anesthesia Other Findings   Reproductive/Obstetrics                             Anesthesia Physical Anesthesia Plan  ASA: 3  Anesthesia Plan: MAC   Post-op Pain Management:    Induction: Intravenous  PONV Risk Score and Plan: 2 and Propofol infusion and Treatment may vary due to age or medical condition  Airway Management Planned: Simple Face Mask and Nasal Cannula  Additional Equipment: None  Intra-op Plan:   Post-operative Plan:   Informed Consent: I have reviewed the patients History and Physical, chart, labs and discussed the procedure including the risks, benefits and alternatives for the proposed anesthesia with the patient or authorized representative who has indicated his/her understanding and acceptance.     Dental advisory given  Plan Discussed with:   Anesthesia Plan Comments:        Anesthesia Quick Evaluation

## 2022-10-14 NOTE — Progress Notes (Signed)
Transesophageal Echocardiogram Note  Sarah [name] [id number] [birth year]  Procedure: Transesophageal Echocardiogram Indications: CVA  Procedure Details Consent: Obtained Time Out: Verified patient identification, verified procedure, site/side was marked, verified correct patient position, special equipment/implants available, Radiology Safety Procedures followed,  medications/allergies/relevent history reviewed, required imaging and test results available.  Performed  Medications:  Pt sedated by anesthesia with diprovan 180 mg IV total.  Normal LV function; moderate LVH; no LVOT gradient at rest; no LAA thrombus; nodular density left coronary cusp (possible nodular calcification); mobile density right coronary cusp (possible vegetation vs lambl's excrescence); trace AI, MR, TR and PI; negative saline microcavitation study.   Complications: No apparent complications Patient did tolerate procedure well.  Olga Millers, MD

## 2022-10-14 NOTE — Transfer of Care (Signed)
Immediate Anesthesia Transfer of Care Note  Patient: Sarah [name]  Procedure(s) Performed: TRANSESOPHAGEAL ECHOCARDIOGRAM  Patient Location: Cath Lab  Anesthesia Type:MAC  Level of Consciousness: awake, alert , and oriented  Airway & Oxygen Therapy: Patient Spontanous Breathing  Post-op Assessment: Report given to RN, Post -op Vital signs reviewed and stable, Patient moving all extremities X 4, and Patient able to stick tongue midline  Post vital signs: Reviewed and stable  Last Vitals:  Vitals Value Taken Time  BP 168/66   Temp 98.6   Pulse 69   Resp 15   SpO2 97     Last Pain:  Vitals:   10/14/22 1255  TempSrc: Temporal  PainSc:       Patients Stated Pain Goal: 0 (10/13/22 2127)  Complications: No notable events documented.

## 2022-10-14 NOTE — Progress Notes (Signed)
Prior images reviewed side by side. Sarah [name] similar  abnormality was seen on the left cusp of the aortic valve. It was not seen  on TEE from 2018.    Conclusion(s)/Recommendation(s): Recommend TEE for evaluation of the  aortic valve abnormality.   Antimicrobials:  Anti-infectives (From admission, onward)    None       Subjective: No complaints Brother and SIL at bedside, sister on phone  Objective: Vitals:   10/14/22 1449 10/14/22 1452 10/14/22 1522 10/14/22 1600  BP: (!) 189/76 (!) 135/110 (!) 206/79 (!) 116/102  Pulse:  72 75   Resp:   18   Temp:   97.7 F (36.5 C)   TempSrc:  Oral Oral   SpO2:   99%   Weight:      Height:        Intake/Output Summary (Last 24 hours) at 10/14/2022 1748 Last data filed at 10/14/2022 1416 Gross per 24 hour  Intake 590 ml  Output --  Net 590 ml   Filed Weights   10/12/22 0046  Weight: 51 kg    Examination:  General: No acute distress. Cardiovascular: RRR Lungs: unlabored Neurological: Alert. Moves all extremities 4. Cranial nerves II through XII grossly intact. Extremities: No clubbing or cyanosis. No edema.  Data Reviewed: I have personally reviewed following labs and imaging studies  CBC: Recent Labs  Lab 10/11/22 1902 10/12/22 0613 10/14/22 0723  WBC 9.1 8.5 7.5  NEUTROABS 6.0  --  4.5  HGB 10.7* 11.0* 10.8*  HCT 32.3* 33.1* 32.4*  MCV 93.1 91.4 94.5  PLT 231 318 288    Basic Metabolic Panel: Recent Labs  Lab 10/11/22 1902 10/12/22 0613 10/13/22 1852 10/14/22 0723  NA 138 140 135 138  K 3.5 2.9* 3.2* 3.0*  CL 106 110 105 107  CO2 23 24 24 23   GLUCOSE 459* 184* 266* 155*  BUN 15 10 17 14   CREATININE 1.01* 0.97 1.24* 1.09*  CALCIUM 9.0 9.1 8.4* 8.6*  MG  --  1.9  --  2.1  PHOS  --  3.9  --  3.5    GFR: Estimated Creatinine Clearance: 32 mL/min (Sarah [name]) (by C-G formula based on SCr of 1.09 mg/dL (H)).  Liver Function Tests: Recent Labs  Lab 10/11/22 1902 10/14/22 0723  AST 33 40  ALT 56* 34  ALKPHOS 72 61  BILITOT 0.3 0.3  PROT 5.8* 4.9*  ALBUMIN 2.7* 1.8*    CBG: Recent Labs  Lab 10/13/22 1530 10/13/22 2103 10/14/22 0609  10/14/22 1120 10/14/22 1516  GLUCAP 300* 286* 155* 159* 118*     No results found for this or any previous visit (from the past 240 hour(s)).       Radiology Studies: ECHO TEE  Result Date: 10/14/2022    TRANSESOPHOGEAL ECHO REPORT   Patient Name:   Sarah [name] Date of Exam: 10/14/2022 Medical Rec #:  [id number]            Height:       60.0 in Accession #:    [id number]           Weight:       112.4 lb Date of Birth:  1945/01/18            BSA:          1.461 m Patient Age:    77 years             BP:  PROGRESS NOTE    Sarah [name]  ZOX:[id number] DOB: [birth year] DOA: 10/11/2022 PCP: [place], [name], [name]  Chief Complaint  Patient presents with   Weakness    Brief Narrative:   77 year old female with history of prior CVA with residual left-sided hemiparesis, hypertension, hyperlipidemia, diabetes mellitus type 2, CAD who has loop recorder in place.  [facility] with 2-day history of right leg weakness and falls.  She is on Plavix due to history of prior stroke. CT head was negative, MRI of the brain showed scattered small acute infarcts along the posterior left frontal cortex/white matter in the left occipital cortex and left lower cerebellum.  Neurology was consulted  Assessment & Plan:   Principal Problem:   Stroke-like symptoms Active Problems:   Generalized weakness   Stroke (cerebrum) (HCC)   Acute ischemic stroke (HCC)  Acute multifocal ischemic stroke -MRI brain with small acute infarcts scattered in the L cerebral hemisphere and L cerebellum -CTA head/neck without emergent finding, atherosclerosis with moderate to advanced bilateral MCA stenosis, worse on R where there is chronic inferior division infarction - neurology c/s, appreciate recs -> ASA and brilinta x1 month, then plavix, no afib on loop recorder - echo with hyperechoic mass on L coronary cusp measuring 4x7 mm (ddx includes healed vegetation vs atypical myoma vs papillary fibroelastoma vs degerenative change related nodule)  - TEE with mobile density on R coronary cusp (vegetation vs lambl's excrescence) - will continue to discuss with cards -Continue rosuvastatin, Zetia -PT/OT/SLP   Mobile Density on R Coronary Cusp - follow blood cultures, lower suspicion for infection - no white count or fevers - will continue to discuss with cards  Hypertension -Permissive hypertension for 24 hours -Hold amlodipine   Hyperlipidemia -Continue statin   Diabetes mellitus type  2 -Continue Semglee, sliding scale insulin with NovoLog   CAD -Continue statin, Plavix   DVT prophylaxis: SCD Code Status: full Family Communication: brother and SIL at bedside, sister over phone Disposition:   Status is: Inpatient Remains inpatient appropriate because: need for neurology clearance, TEE   Consultants:  neurology  Procedures:  Echo IMPRESSIONS     1. Left ventricular ejection fraction, by estimation, is 65 to 70%. The  left ventricle has normal function. The left ventricle has no regional  wall motion abnormalities. There is mild concentric left ventricular  hypertrophy. Left ventricular diastolic  parameters are consistent with Grade I diastolic dysfunction (impaired  relaxation).   2. Right ventricular systolic function is normal. The right ventricular  size is normal.   3. Left atrial size was mildly dilated.   4. The mitral valve is normal in structure. Trivial mitral valve  regurgitation. No evidence of mitral stenosis.   5. There is Sarah [name] hyperechoic mass on the left coronary cusp measuring 4 x 7  mm. It has Sarah [name] very short and fairly broad attachment to the leaflet and is  only slightly mobile. Diffierential diagnosis is broad and includes healed  vegetation, atypical myxoma,  papillary fibroelastoma and even degenerative change-related nodule. The  aortic valve is tricuspid. There is mild thickening of the aortic valve.  Aortic valve regurgitation is trivial. Aortic valve  sclerosis/calcification is present, without any evidence   of aortic stenosis.   6. The inferior vena cava is normal in size with greater than 50%  respiratory variability, suggesting right atrial pressure of 3 mmHg.   Comparison(s): Sarah [name] prior study was performed on 10/24/2021. No significant  change from prior study.  Prior images reviewed side by side. Sarah [name] similar  abnormality was seen on the left cusp of the aortic valve. It was not seen  on TEE from 2018.    Conclusion(s)/Recommendation(s): Recommend TEE for evaluation of the  aortic valve abnormality.   Antimicrobials:  Anti-infectives (From admission, onward)    None       Subjective: No complaints Brother and SIL at bedside, sister on phone  Objective: Vitals:   10/14/22 1449 10/14/22 1452 10/14/22 1522 10/14/22 1600  BP: (!) 189/76 (!) 135/110 (!) 206/79 (!) 116/102  Pulse:  72 75   Resp:   18   Temp:   97.7 F (36.5 C)   TempSrc:  Oral Oral   SpO2:   99%   Weight:      Height:        Intake/Output Summary (Last 24 hours) at 10/14/2022 1748 Last data filed at 10/14/2022 1416 Gross per 24 hour  Intake 590 ml  Output --  Net 590 ml   Filed Weights   10/12/22 0046  Weight: 51 kg    Examination:  General: No acute distress. Cardiovascular: RRR Lungs: unlabored Neurological: Alert. Moves all extremities 4. Cranial nerves II through XII grossly intact. Extremities: No clubbing or cyanosis. No edema.  Data Reviewed: I have personally reviewed following labs and imaging studies  CBC: Recent Labs  Lab 10/11/22 1902 10/12/22 0613 10/14/22 0723  WBC 9.1 8.5 7.5  NEUTROABS 6.0  --  4.5  HGB 10.7* 11.0* 10.8*  HCT 32.3* 33.1* 32.4*  MCV 93.1 91.4 94.5  PLT 231 318 288    Basic Metabolic Panel: Recent Labs  Lab 10/11/22 1902 10/12/22 0613 10/13/22 1852 10/14/22 0723  NA 138 140 135 138  K 3.5 2.9* 3.2* 3.0*  CL 106 110 105 107  CO2 23 24 24 23   GLUCOSE 459* 184* 266* 155*  BUN 15 10 17 14   CREATININE 1.01* 0.97 1.24* 1.09*  CALCIUM 9.0 9.1 8.4* 8.6*  MG  --  1.9  --  2.1  PHOS  --  3.9  --  3.5    GFR: Estimated Creatinine Clearance: 32 mL/min (Sarah [name]) (by C-G formula based on SCr of 1.09 mg/dL (H)).  Liver Function Tests: Recent Labs  Lab 10/11/22 1902 10/14/22 0723  AST 33 40  ALT 56* 34  ALKPHOS 72 61  BILITOT 0.3 0.3  PROT 5.8* 4.9*  ALBUMIN 2.7* 1.8*    CBG: Recent Labs  Lab 10/13/22 1530 10/13/22 2103 10/14/22 0609  10/14/22 1120 10/14/22 1516  GLUCAP 300* 286* 155* 159* 118*     No results found for this or any previous visit (from the past 240 hour(s)).       Radiology Studies: ECHO TEE  Result Date: 10/14/2022    TRANSESOPHOGEAL ECHO REPORT   Patient Name:   Sarah [name] Date of Exam: 10/14/2022 Medical Rec #:  [id number]            Height:       60.0 in Accession #:    [id number]           Weight:       112.4 lb Date of Birth:  1945/01/18            BSA:          1.461 m Patient Age:    77 years             BP:  PROGRESS NOTE    Sarah [name]  ZOX:[id number] DOB: [birth year] DOA: 10/11/2022 PCP: [place], [name], [name]  Chief Complaint  Patient presents with   Weakness    Brief Narrative:   77 year old female with history of prior CVA with residual left-sided hemiparesis, hypertension, hyperlipidemia, diabetes mellitus type 2, CAD who has loop recorder in place.  [facility]ially went [facility] with 2-day history of right leg weakness and falls.  She is on Plavix due to history of prior stroke. CT head was negative, MRI of the brain showed scattered small acute infarcts along the posterior left frontal cortex/white matter in the left occipital cortex and left lower cerebellum.  Neurology was consulted  Assessment & Plan:   Principal Problem:   Stroke-like symptoms Active Problems:   Generalized weakness   Stroke (cerebrum) (HCC)   Acute ischemic stroke (HCC)  Acute multifocal ischemic stroke -MRI brain with small acute infarcts scattered in the L cerebral hemisphere and L cerebellum -CTA head/neck without emergent finding, atherosclerosis with moderate to advanced bilateral MCA stenosis, worse on R where there is chronic inferior division infarction - neurology c/s, appreciate recs -> ASA and brilinta x1 month, then plavix, no afib on loop recorder - echo with hyperechoic mass on L coronary cusp measuring 4x7 mm (ddx includes healed vegetation vs atypical myoma vs papillary fibroelastoma vs degerenative change related nodule)  - TEE with mobile density on R coronary cusp (vegetation vs lambl's excrescence) - will continue to discuss with cards -Continue rosuvastatin, Zetia -PT/OT/SLP   Mobile Density on R Coronary Cusp - follow blood cultures, lower suspicion for infection - no white count or fevers - will continue to discuss with cards  Hypertension -Permissive hypertension for 24 hours -Hold amlodipine   Hyperlipidemia -Continue statin   Diabetes mellitus type  2 -Continue Semglee, sliding scale insulin with NovoLog   CAD -Continue statin, Plavix   DVT prophylaxis: SCD Code Status: full Family Communication: brother and SIL at bedside, sister over phone Disposition:   Status is: Inpatient Remains inpatient appropriate because: need for neurology clearance, TEE   Consultants:  neurology  Procedures:  Echo IMPRESSIONS     1. Left ventricular ejection fraction, by estimation, is 65 to 70%. The  left ventricle has normal function. The left ventricle has no regional  wall motion abnormalities. There is mild concentric left ventricular  hypertrophy. Left ventricular diastolic  parameters are consistent with Grade I diastolic dysfunction (impaired  relaxation).   2. Right ventricular systolic function is normal. The right ventricular  size is normal.   3. Left atrial size was mildly dilated.   4. The mitral valve is normal in structure. Trivial mitral valve  regurgitation. No evidence of mitral stenosis.   5. There is Sarah [name] hyperechoic mass on the left coronary cusp measuring 4 x 7  mm. It has Sarah [name] very short and fairly broad attachment to the leaflet and is  only slightly mobile. Diffierential diagnosis is broad and includes healed  vegetation, atypical myxoma,  papillary fibroelastoma and even degenerative change-related nodule. The  aortic valve is tricuspid. There is mild thickening of the aortic valve.  Aortic valve regurgitation is trivial. Aortic valve  sclerosis/calcification is present, without any evidence   of aortic stenosis.   6. The inferior vena cava is normal in size with greater than 50%  respiratory variability, suggesting right atrial pressure of 3 mmHg.   Comparison(s): Sarah [name] prior study was performed on 10/24/2021. No significant  change from prior study.  Aorta: The aortic root and ascending aorta are structurally normal, with no evidence of dilitation. There is moderate (Grade III) plaque involving the descending aorta. IAS/Shunts: No atrial level shunt detected by color flow Doppler. Agitated saline contrast was given intravenously to evaluate for intracardiac shunting. Agitated saline contrast bubble study was negative, with no evidence of any interatrial shunt. Additional Comments: Nodular density noted on left coronary cusp (possible nodular calcification); mobile density on right coronary cusp (vegetation vs lambl's excrescenc; less likely fibroelastoma). Spectral Doppler performed.  AORTA Ao Asc diam: 2.80 cm Sarah [name] Electronically signed by Sarah [name] Signature Date/Time: 10/14/2022/3:02:34 PM    Final    EP STUDY  Result Date: 10/14/2022 See surgical note for result.  ECHOCARDIOGRAM COMPLETE  Result Date: 10/13/2022    ECHOCARDIOGRAM REPORT   Patient Name:   Sarah [name] Date of Exam: 10/13/2022 Medical Rec #:  [id number]            Height:       60.0 in Accession #:    [id number]           Weight:       112.4 lb Date of Birth:  04/25/1945            BSA:          1.461 m Patient Age:    77 years             BP:           108/80 mmHg Patient Gender: F                    HR:           77 bpm. Exam Location:  Inpatient Procedure: 2D Echo, Cardiac Doppler and Color Doppler Indications:    Stroke I63.9  History:        Patient has prior history of Echocardiogram examinations, most                 recent 10/24/2021. Hypertrophic Cardiomyopathy, CAD, Abnormal                 ECG, Carotid Disease; Risk Factors:Diabetes, Dyslipidemia,                 Hypertension and Former Smoker.  Sonographer:    Sarah [name] RVT RCS  Referring Phys: [id number] Sarah [name] IMPRESSIONS  1. Left ventricular ejection fraction, by estimation, is 65 to 70%. The left ventricle has normal function. The left ventricle has no regional wall motion abnormalities. There is mild concentric left ventricular hypertrophy. Left ventricular diastolic parameters are consistent with Grade I diastolic dysfunction (impaired relaxation).  2. Right ventricular systolic function is normal. The right ventricular size is normal.  3. Left atrial size was mildly dilated.  4. The mitral valve is normal in structure. Trivial mitral valve regurgitation. No evidence of mitral stenosis.  5. There is Sarah [name] hyperechoic mass on the left coronary cusp measuring 4 x 7 mm. It has Sarah [name] very short and fairly broad attachment to the leaflet and is only slightly mobile. Diffierential diagnosis is broad and includes healed vegetation, atypical myxoma, papillary fibroelastoma and even degenerative change-related nodule. The aortic valve is tricuspid. There is mild thickening of the aortic valve. Aortic valve regurgitation is trivial. Aortic valve sclerosis/calcification is present, without any evidence  of aortic stenosis.  6. The inferior vena cava is normal in size with greater than 50% respiratory variability, suggesting right atrial pressure of  PROGRESS NOTE    Sarah [name]  ZOX:[id number] DOB: [birth year] DOA: 10/11/2022 PCP: [place], [name], [name]  Chief Complaint  Patient presents with   Weakness    Brief Narrative:   77 year old female with history of prior CVA with residual left-sided hemiparesis, hypertension, hyperlipidemia, diabetes mellitus type 2, CAD who has loop recorder in place.  [facility] with 2-day history of right leg weakness and falls.  She is on Plavix due to history of prior stroke. CT head was negative, MRI of the brain showed scattered small acute infarcts along the posterior left frontal cortex/white matter in the left occipital cortex and left lower cerebellum.  Neurology was consulted  Assessment & Plan:   Principal Problem:   Stroke-like symptoms Active Problems:   Generalized weakness   Stroke (cerebrum) (HCC)   Acute ischemic stroke (HCC)  Acute multifocal ischemic stroke -MRI brain with small acute infarcts scattered in the L cerebral hemisphere and L cerebellum -CTA head/neck without emergent finding, atherosclerosis with moderate to advanced bilateral MCA stenosis, worse on R where there is chronic inferior division infarction - neurology c/s, appreciate recs -> ASA and brilinta x1 month, then plavix, no afib on loop recorder - echo with hyperechoic mass on L coronary cusp measuring 4x7 mm (ddx includes healed vegetation vs atypical myoma vs papillary fibroelastoma vs degerenative change related nodule)  - TEE with mobile density on R coronary cusp (vegetation vs lambl's excrescence) - will continue to discuss with cards -Continue rosuvastatin, Zetia -PT/OT/SLP   Mobile Density on R Coronary Cusp - follow blood cultures, lower suspicion for infection - no white count or fevers - will continue to discuss with cards  Hypertension -Permissive hypertension for 24 hours -Hold amlodipine   Hyperlipidemia -Continue statin   Diabetes mellitus type  2 -Continue Semglee, sliding scale insulin with NovoLog   CAD -Continue statin, Plavix   DVT prophylaxis: SCD Code Status: full Family Communication: brother and SIL at bedside, sister over phone Disposition:   Status is: Inpatient Remains inpatient appropriate because: need for neurology clearance, TEE   Consultants:  neurology  Procedures:  Echo IMPRESSIONS     1. Left ventricular ejection fraction, by estimation, is 65 to 70%. The  left ventricle has normal function. The left ventricle has no regional  wall motion abnormalities. There is mild concentric left ventricular  hypertrophy. Left ventricular diastolic  parameters are consistent with Grade I diastolic dysfunction (impaired  relaxation).   2. Right ventricular systolic function is normal. The right ventricular  size is normal.   3. Left atrial size was mildly dilated.   4. The mitral valve is normal in structure. Trivial mitral valve  regurgitation. No evidence of mitral stenosis.   5. There is Sarah [name] hyperechoic mass on the left coronary cusp measuring 4 x 7  mm. It has Sarah [name] very short and fairly broad attachment to the leaflet and is  only slightly mobile. Diffierential diagnosis is broad and includes healed  vegetation, atypical myxoma,  papillary fibroelastoma and even degenerative change-related nodule. The  aortic valve is tricuspid. There is mild thickening of the aortic valve.  Aortic valve regurgitation is trivial. Aortic valve  sclerosis/calcification is present, without any evidence   of aortic stenosis.   6. The inferior vena cava is normal in size with greater than 50%  respiratory variability, suggesting right atrial pressure of 3 mmHg.   Comparison(s): Sarah [name] prior study was performed on 10/24/2021. No significant  change from prior study.

## 2022-10-15 ENCOUNTER — Encounter (HOSPITAL_COMMUNITY): Payer: Self-pay | Admitting: Cardiology

## 2022-10-15 ENCOUNTER — Other Ambulatory Visit (HOSPITAL_COMMUNITY): Payer: Self-pay

## 2022-10-15 DIAGNOSIS — R2990 Unspecified symptoms and signs involving the nervous system: Secondary | ICD-10-CM | POA: Diagnosis not present

## 2022-10-15 DIAGNOSIS — I359 Nonrheumatic aortic valve disorder, unspecified: Secondary | ICD-10-CM | POA: Diagnosis not present

## 2022-10-15 DIAGNOSIS — I10 Essential (primary) hypertension: Secondary | ICD-10-CM | POA: Diagnosis not present

## 2022-10-15 DIAGNOSIS — R931 Abnormal findings on diagnostic imaging of heart and coronary circulation: Secondary | ICD-10-CM | POA: Diagnosis not present

## 2022-10-15 DIAGNOSIS — I639 Cerebral infarction, unspecified: Secondary | ICD-10-CM | POA: Diagnosis not present

## 2022-10-15 LAB — BASIC METABOLIC PANEL
Anion gap: 9 (ref 5–15)
BUN: 13 mg/dL (ref 8–23)
CO2: 21 mmol/L — ABNORMAL LOW (ref 22–32)
Calcium: 8.8 mg/dL — ABNORMAL LOW (ref 8.9–10.3)
Chloride: 109 mmol/L (ref 98–111)
Creatinine, Ser: 0.95 mg/dL (ref 0.44–1.00)
GFR, Estimated: >60 mL/min (ref >60)
Glucose, Bld: 134 mg/dL — ABNORMAL HIGH (ref 70–99)
Potassium: 3.5 mmol/L (ref 3.5–5.1)
Sodium: 139 mmol/L (ref 135–145)

## 2022-10-15 LAB — GLUCOSE, CAPILLARY
Glucose-Capillary: 140 mg/dL — ABNORMAL HIGH (ref 70–99)
Glucose-Capillary: 253 mg/dL — ABNORMAL HIGH (ref 70–99)
Glucose-Capillary: 214 mg/dL — ABNORMAL HIGH (ref 70–99)
Glucose-Capillary: 173 mg/dL — ABNORMAL HIGH (ref 70–99)

## 2022-10-15 MED ORDER — AMLODIPINE BESYLATE 5 MG PO TABS
5.0000 mg | ORAL_TABLET | Freq: Every day | ORAL | Status: DC
  Administered 2022-10-16: 5 mg via ORAL
  Filled 2022-10-15: qty 1

## 2022-10-15 MED ORDER — DICLOFENAC SODIUM 1 % EX GEL: 2.0000 g | Freq: Four times a day (QID) | CUTANEOUS | Status: DC | PRN

## 2022-10-15 MED ORDER — ACETAMINOPHEN 325 MG PO TABS: 650.0000 mg | ORAL_TABLET | Freq: Four times a day (QID) | ORAL | Status: DC | PRN

## 2022-10-15 MED ORDER — POLYETHYLENE GLYCOL 3350 17 G PO PACK
17.0000 g | PACK | Freq: Two times a day (BID) | ORAL | Status: DC
  Administered 2022-10-15 – 2022-10-16 (×3): 17 g via ORAL
  Filled 2022-10-15 (×4): qty 1

## 2022-10-15 MED ORDER — LOSARTAN POTASSIUM 50 MG PO TABS
50.0000 mg | ORAL_TABLET | Freq: Every day | ORAL | Status: DC
  Administered 2022-10-15 – 2022-10-16 (×2): 50 mg via ORAL
  Filled 2022-10-15 (×2): qty 1

## 2022-10-15 NOTE — TOC Benefit Eligibility Note (Signed)
Patient Product/process development scientist completed.    The patient is insured through [facility]. Patient has Medicare and is not eligible for a copay card, but may be able to apply for patient assistance, if available.    Ran test claim for Brilitna 90 mg and the current 30 day co-pay is $109.83 due to being in the Coverage Gap (donut hole).   This test claim was processed through [facility]- copay amounts may vary at other pharmacies due to pharmacy/plan contracts, or as the patient moves through the different stages of their insurance plan.     Roland Earl, CPHT Pharmacy Technician III Certified Patient Advocate [facility] Patient Advocate Team Direct Number: [phone]  Fax: [phone]

## 2022-10-15 NOTE — Plan of Care (Signed)
  Problem: Activity: Goal: Risk for activity intolerance will decrease Outcome: Progressing   Problem: Nutrition: Goal: Adequate nutrition will be maintained Outcome: Progressing   Problem: Coping: Goal: Level of anxiety will decrease Outcome: Progressing   Problem: Elimination: Goal: Will not experience complications related to urinary retention Outcome: Progressing   Problem: Pain Managment: Goal: General experience of comfort will improve Outcome: Progressing   Problem: Safety: Goal: Ability to remain free from injury will improve Outcome: Progressing   

## 2022-10-15 NOTE — Progress Notes (Signed)
Occupational Therapy Treatment Patient Details Name: Sarah [name] MRN: [id number] DOB: [birth year] Today's Date: 10/15/2022   History of present illness Sarah [name] is a 77 y.o. female who presented after 2 days of RLE weakness and falls. MRI with small scattered acute infarcts along the posterior left frontal cortex/white matter in the left occipital cortex, and in the lower left cerebellum. PMHx: CVA with residual left-sided hemiparesis, hypertension, hyperlipidemia, diabetes, CAD   OT comments  Patient continues to make steady progress towards goals in skilled OT session. Patient's session focus on ADL wash up at sink and increasing overall activity tolerance and functional mobility. Patient min A for ADLs and functional mobility, completing multiple sit<>stands from the sink to complete wash up. Patient with cues for need for motor planning and body scheme on the L but would correct with time. OT will continue to follow, with recommendation remaining appropriate.        If plan is discharge home, recommend the following:  A little help with walking and/or transfers;A little help with bathing/dressing/bathroom;Assistance with cooking/housework;Direct supervision/assist for medications management;Direct supervision/assist for financial management;Assist for transportation;Help with stairs or ramp for entrance   Equipment Recommendations  None recommended by OT    Recommendations for Other Services      Precautions / Restrictions Precautions Precautions: Fall       Mobility Bed Mobility Overal bed mobility: Needs Assistance Bed Mobility: Sit to Supine       Sit to supine: Supervision   General bed mobility comments: supervision for safety, cues to fully return back to bed (body scheme patient laying crooked in bed and did not attempt to adjust independently)    Transfers Overall transfer level: Needs assistance Equipment used: Rolling walker (2  wheels) Transfers: Sit to/from Stand Sit to Stand: Min assist           General transfer comment: multiple sit<>stands from sink with min A due to increased fatigue     Balance Overall balance assessment: History of Falls, Needs assistance Sitting-balance support: Feet supported, No upper extremity supported Sitting balance-Leahy Scale: Fair     Standing balance support: During functional activity, Bilateral upper extremity supported, Reliant on assistive device for balance Standing balance-Leahy Scale: Poor Standing balance comment: Reliant on external support                           ADL either performed or assessed with clinical judgement   ADL Overall ADL's : Needs assistance/impaired     Grooming: Wash/dry hands;Wash/dry face;Minimal assistance;Standing Grooming Details (indicate cue type and reason): increased assist after fatiguing and completing ADL washup at sink Upper Body Bathing: Set up;Sitting Upper Body Bathing Details (indicate cue type and reason): at sink Lower Body Bathing: Minimal assistance;Sit to/from stand Lower Body Bathing Details (indicate cue type and reason): min A to complete in standing at sink Upper Body Dressing : Set up;Sitting   Lower Body Dressing: Minimal assistance Lower Body Dressing Details (indicate cue type and reason): donning and doffing mesh underwear Toilet Transfer: Ambulation;Rolling walker (2 wheels);Minimal assistance Toilet Transfer Details (indicate cue type and reason): min A after completing ADL wash up at sink         Functional mobility during ADLs: Minimal assistance;Cueing for sequencing;Cueing for safety;Rolling walker (2 wheels) General ADL Comments: Session focus on ADL wash up at sink and increasing overall activity tolerance and functional mobility    Extremity/Trunk Assessment  Vision       Perception Perception Perception: Impaired Preception Impairment Details:  Inattention/Neglect Perception-Other Comments: LEFT side   Praxis Praxis Praxis: Impaired Praxis Impairment Details: Initiation;Motor planning;Organization    Cognition Arousal: Alert Behavior During Therapy: WFL for tasks assessed/performed Overall Cognitive Status: Impaired/Different from baseline Area of Impairment: Safety/judgement, Problem solving, Attention, Following commands, Awareness, Memory                   Current Attention Level: Sustained Memory: Decreased recall of precautions Following Commands: Follows one step commands consistently Safety/Judgement: Decreased awareness of deficits, Decreased awareness of safety Awareness: Emergent Problem Solving: Slow processing, Requires verbal cues General Comments: Patient with decreased awareness and insight when completing ADL tasks at the sink, poor safety awareness, but will correct with cues        Exercises      Shoulder Instructions       General Comments      Pertinent Vitals/ Pain       Pain Assessment Pain Assessment: No/denies pain  Home Living                                          Prior Functioning/Environment              Frequency  Min 1X/week        Progress Toward Goals  OT Goals(current goals can now be found in the care plan section)  Progress towards OT goals: Progressing toward goals  Acute Rehab OT Goals Patient Stated Goal: to get better OT Goal Formulation: With patient Time For Goal Achievement: 10/26/22 Potential to Achieve Goals: Good  Plan      Co-evaluation                 AM-PAC OT "6 Clicks" Daily Activity     Outcome Measure   Help from another person eating meals?: None Help from another person taking care of personal grooming?: A Little Help from another person toileting, which includes using toliet, bedpan, or urinal?: A Little Help from another person bathing (including washing, rinsing, drying)?: A Little Help from  another person to put on and taking off regular upper body clothing?: A Little Help from another person to put on and taking off regular lower body clothing?: A Little 6 Click Score: 19    End of Session Equipment Utilized During Treatment: Rolling walker (2 wheels)  OT Visit Diagnosis: Unsteadiness on feet (R26.81);Other abnormalities of gait and mobility (R26.89);Muscle weakness (generalized) (M62.81)   Activity Tolerance Patient tolerated treatment well   Patient Left in bed;with call bell/phone within reach;with bed alarm set;with nursing/sitter in room   Nurse Communication Mobility status        Time: 4403-4742 OT Time Calculation (min): 18 min  Charges: OT General Charges $OT Visit: 1 Visit OT Treatments $Self Care/Home Management : 8-22 mins  Pollyann Glen E. Elizabethann Lackey, OTR/L Acute Rehabilitation Services [phone]   Cherlyn Cushing 10/15/2022, 3:12 PM

## 2022-10-15 NOTE — Progress Notes (Signed)
STROKE TEAM PROGRESS NOTE   BRIEF HPI Sarah [name] is a 77 y.o. female with a PMHx of glaucoma, bilateral carpal tunnel syndrome, DM, HTN, HLD, CAD s/p stent placement, current loop recorder in place, adenomatous colonic polyps, large prior right MCA stroke with residual left hemiparesis and DM who initially presented to [facility] 9.30 d/t new-onset of right sided leg weakness with falls over the past 24 hours. LKN was 9/29 at 5 PM, at which time she first noticed the RLE weakness. She fell twice on Sunday and has also had additional falls after the first two. She denied any speech deficit, headache or vision changes. She was transferred from [facility] to the [facility] ED for MRI.  MRI brain reveals 3 punctate acute ischemic infarctions in the left MCA territory. Also noted on MRI is a large chronic right MCA stroke.   SIGNIFICANT HOSPITAL EVENTS 10/3: TEE shows "mobile density right coronary cusp (possible vegetation vs lambl's excrescence)"  PENDING Cardiology recommendations.   INTERIM HISTORY/SUBJECTIVE No family at bedside.  Patient reclining in bed, no complaints, had a TEE yesterday showing a mobile density on the right coronary cusp.  Differential diagnosis including vegetation and Lambl's excrescences.  So far blood culture negative.  Cardiology recommend aspirin and Brilinta for 1 month and then switch to aspirin and Plavix. If she has a recurrent CVA while on DAPT, then may need to consider anticoagulation instead.   OBJECTIVE  CBC    Component Value Date/Time   WBC 7.5 10/14/2022 0723   RBC 3.43 (L) 10/14/2022 0723   HGB 10.8 (L) 10/14/2022 0723   HGB 12.1 03/04/2020 1025   HCT 32.4 (L) 10/14/2022 0723   HCT 37.3 03/04/2020 1025   PLT 288 10/14/2022 0723   PLT 342 03/04/2020 1025   MCV 94.5 10/14/2022 0723   MCV 94 03/04/2020 1025   MCH 31.5 10/14/2022 0723   MCHC 33.3 10/14/2022 0723   RDW 12.3 10/14/2022 0723   RDW 11.8 03/04/2020 1025   LYMPHSABS 2.2 10/14/2022 0723   MONOABS  0.5 10/14/2022 0723   EOSABS 0.2 10/14/2022 0723   BASOSABS 0.0 10/14/2022 0723    BMET    Component Value Date/Time   NA 139 10/15/2022 0711   NA 144 12/08/2021 1351   K 3.5 10/15/2022 0711   CL 109 10/15/2022 0711   CO2 21 (L) 10/15/2022 0711   GLUCOSE 134 (H) 10/15/2022 0711   BUN 13 10/15/2022 0711   BUN 15 12/08/2021 1351   CREATININE 0.95 10/15/2022 0711   CREATININE 0.55 (L) 10/24/2019 1602   CALCIUM 8.8 (L) 10/15/2022 0711   EGFR 72 12/08/2021 1351   GFRNONAA >60 10/15/2022 0711   GFRNONAA 94 10/24/2019 1602    IMAGING past 24 hours No results found.  Vitals:   10/15/22 0339 10/15/22 0758 10/15/22 0814 10/15/22 1205  BP: (!) 148/90 (!) 178/72 (!) 178/72 (!) 177/75  Pulse: 67 76 76 73  Resp: 18 18 16 18   Temp: 98 F (36.7 C) 98.5 F (36.9 C) 98.5 F (36.9 C) 98.4 F (36.9 C)  TempSrc: Oral Oral Oral Oral  SpO2: 99% 99% 99% 100%  Weight:      Height:       Physical Exam  GENERAL: Awake, alert in NAD HEENT: - Normocephalic and atraumatic, dry mm LUNGS - Unlabored, on room air CV - S1S2 RRR, no m/r/g ABDOMEN - Soft, nontender, non-distended Ext: warm, well perfused, intact peripheral pulses, no edema   NEURO:  MENTAL STATUS: awake, oriented  to person, place, time, situation. Good attention. Able to give clear history.  LANG/SPEECH: Speech is mildly dysarthric (baseline per patient). Naming and repetition intact, fluent, follows complex commands.? CRANIAL NERVES:? II: Pupils equal and reactive, no RAPD, no VF deficits? III, IV, VI: EOM intact, no gaze preference or deviation, no nystagmus.? V: normal sensation in V1, V2, and V3 segments bilaterally? VII: nasolabial fold flattening on the left with slight droop VIII: normal hearing to speech? IX, X: normal palatal elevation, no uvular deviation? XI: 5/5 head turn and 5/5 shoulder shrug bilaterally? XII: midline tongue protrusion? MOTOR:? 4+/5 R shoulder, elbow and 5/5 R wrist, grips.   4/5 in R hip,  knee, ankle dorsiflexors and planter flexors. 5/5 L elbow flexors/extensors, wrist, grips.   5/5 in L hip, knee, ankle dorsiflexors and planter flexors.? SENSORY:?Normal to touch.?No extinction to double sided stimulation  COORDINATION: Slow but intact FNF and heel-to-shin, no ataxia GAIT: walking in hallway with stand-by assistance, no DME needed  ASSESSMENT/PLAN  Acute Ischemic Infarct:  several small infarcts at left brain watershed areas and left cerebellum, etiology concerning for cardioembolic source. Code Stroke CT head No acute abnormality.  CTA head & neck: No emergent finding. Atherosclerosis with moderate to advanced bilateral MCA stenosis, worse on the right where there is chronic inferior division infarction. No flow reducing stenosis or embolic source seen in the neck.  MRI brain: Small acute infarcts scattered in the left cerebral hemisphere and left cerebellum. Extensive chronic small vessel disease and extensive chronic right cerebral cortex infarcts.  2D Echo EF 65-70%, hyperechoic mass on the left coronary cusp measuring 4 x 7mm. It has a very short and fairly broad attachment to the leaflet and is only slightly mobile. Diffierential diagnosis is broad and includes healed vegetation, atypical myxoma, papillary fibroelastoma and even degenerative change-related nodule.  TEE: nodular density left coronary cusp (possible nodular calcification); mobile density right coronary cusp (possible vegetation vs lambl's excrescence) Loop Recorder Interrogation: No afib LDL 44 HgbA1c 12.2 VTE prophylaxis - [facility] clopidogrel 75 mg daily (noncompliance) prior to admission, now on ASA and brilinta for one month and then switch to Aspirin and Plavix. If she has a recurrent CVA while on DAPT, then may need to consider anticoagulation instead.  Therapy recommendations:  Home Health PT and OT Disposition:  pending  Cardiac mass 2D Echo EF 65-70%, hyperechoic mass on the left coronary cusp  measuring 4 x 7mm. It has a very short and fairly broad attachment to the leaflet and is only slightly mobile. Diffierential diagnosis is broad and includes healed vegetation, atypical myxoma, papillary fibroelastoma and even degenerative change-related nodule.  TEE: nodular density left coronary cusp (possible nodular calcification); mobile density right coronary cusp (possible vegetation vs lambl's excrescence) Blood culture so far negative Cardiology consider more likely small Lambl's excrescence.  Recommend aspirin and Brilinta for 1 months and then switch to aspirin Plavix.  If she had recurrent CVA while on DAPT, then she will need consider anticoagulation.  Hx of Stroke/TIA Stroke in 10/2016 with left-sided weakness.  MRI showed right MCA scattered infarcts.  MRA showed right M2 occlusion.  CTA neck unremarkable.  Carotid Doppler right 40 to 59% stenosis.  EF 55 to 60%.  TEE unremarkable, LDL 98 and A1c 12.9.  Loop recorder placed.  Patient was discharged with DAPT and Lipitor 40. Still in 01/2018 with left hemianopia. CT as well as MRI showed right inferior MCA infarct.  MRA head and neck showed progression of intracranial stenosis from 2018  with right distal M1 and proximal M2 high-grade stenosis as well as left M1 moderate to severe stenosis. A1c 13.5 and LDL 76. EF 60 to 65%.  Loop recorder interrogated, no A. fib found.  Discharged on DAPT and Lipitor 80. 10/2021 left cerebellum, left parietal infarcts, likely incidental finding in the setting of encephalopathy.  MRA showed bilateral M1 stenosis, particularly at once on the right at the location of her previous right MCA infarct.  Recommend 30-day CardioNet monitoring. 06/2022 follow-up with Dr. Nelly Laurence, new loop recorder placed Follows with Dr. Pearlean Brownie / Ihor Austin at [facility], residual deficit with mild left-sided weakness and slurred speech.  Hypertension Home meds: Amlodipine 5 mg, hydralazine 10 mg, Lasix 20 mg, losartan 50 mg Amlodipine  restarted Stable BP goal gradual return to normotensive in 2 to 3 days Long-term BP goal normotensive  Hyperlipidemia Home meds: Crestor 20 mg, Zetia 10 mg, resumed in hospital LDL 44, goal < 70 Continue statin and Zetia at discharge  Diabetes type II Uncontrolled Home meds:  Farxiga 10 mg, insulin, insulin lispro, liraglutide HgbA1c 12.2, goal < 7.0 CBGs SSI Recommend close follow-up with PCP for better DM control  Other Stroke Risk Factors Family hx stroke (father) Remote smoker Advanced age CAD status post stenting  Other acute issues AKI, creatinine 1.01-0 0.97-1.24-1.09 Resulted Hypocalcemia with hypoalbuminemia 8.6 -> corrected to 10.4  Hospital day # 3  Neurology will sign off. Please call with questions. Pt will follow up with stroke clinic NP [name] at [facility] in about 4 weeks. Thanks for the consult.   Marvel Plan, MD PhD Stroke Neurology 10/15/2022 5:10 PM    To contact Stroke Continuity provider, please refer to [email or web address]. After hours, contact General Neurology

## 2022-10-15 NOTE — TOC Transition Note (Addendum)
Transition of Care [facility]) - CM/SW Discharge Note   Patient Details  Name: Sarah [name] MRN: [id number] Date of Birth: 24-Feb-1945  [facility] CM/SW Contact:  Kermit Balo, RN Phone Number: 10/15/2022, 3:48 PM   Clinical Narrative:     Pt is discharging home with home health services through [place]. Information on the AVS. [name] will contact her for the first home visit.  TOC pharmacy to use 30 day free coupon for Brilinta. Pt has transportation home.  Final next level of care: Home w Home Health Services Barriers to Discharge: No Barriers Identified   Patient Goals and CMS Choice CMS Medicare.gov Compare Post Acute Care list provided to:: Patient Choice offered to / list presented to : Patient  Discharge Placement                         Discharge Plan and Services Additional resources added to the After Visit Summary for     Discharge Planning Services: CM Consult Post Acute Care Choice: Home Health                    HH Arranged: PT, OT [facility] Agency: [facility] Date [facility] Agency Contacted: 10/12/22   Representative spoke with at [facility] Agency: Kandee Keen  Social Determinants of Health (SDOH) Interventions SDOH Screenings   Food Insecurity: No Food Insecurity (10/12/2022)  Housing: Low Risk  (10/12/2022)  Transportation Needs: No Transportation Needs (10/12/2022)  Utilities: Not At Risk (10/12/2022)  Alcohol Screen: Low Risk  (05/03/2022)  Depression (PHQ2-9): Low Risk  (05/03/2022)  Financial Resource Strain: Low Risk  (05/03/2022)  Physical Activity: Insufficiently Active (05/03/2022)  Social Connections: Moderately Integrated (05/03/2022)  Stress: No Stress Concern Present (05/03/2022)  Tobacco Use: Medium Risk (10/14/2022)     Readmission Risk Interventions     No data to display

## 2022-10-15 NOTE — Consult Note (Signed)
34  ALKPHOS 72 61  BILITOT 0.3 0.3   Lipids  Recent Labs  Lab 10/12/22 0613  CHOL 139  TRIG 79  HDL 79  LDLCALC 44  CHOLHDL 1.8    Hematology Recent Labs  Lab 10/11/22 1902 10/12/22 0613 11-01-2022 0723  WBC 9.1 8.5 7.5  RBC 3.47* 3.62* 3.43*  HGB 10.7* 11.0* 10.8*  HCT 32.3* 33.1* 32.4*  MCV 93.1 91.4 94.5  MCH 30.8 30.4 31.5  MCHC 33.1 33.2 33.3  RDW 12.7 12.4 12.3  PLT 231 318 288   Thyroid No results for input(s): "TSH", "FREET4" in the last 168 hours.  BNPNo results for input(s): "BNP", "PROBNP" in the last 168 hours.  DDimer No results for input(s): "DDIMER" in the last 168 hours.   Radiology/Studies:  ECHO TEE  Result Date: Nov 01, 2022    TRANSESOPHOGEAL ECHO REPORT   Patient Name:   Sarah [name] Date of Exam: 11/01/2022 Medical Rec #:  [id number]            Height:       60.0 in Accession #:    [id number]           Weight:       112.4 lb Date of Birth:  25-Jun-1945            BSA:          1.461 m Patient Age:    77 years             BP:            176/70 mmHg Patient Gender: F                    HR:           92 bpm. Exam Location:  Inpatient Procedure: Transesophageal Echo, 3D Echo, Cardiac Doppler and Color Doppler Indications:     I35.8 Other nonrheumatic aortic valve disorders. Stroke  History:         Patient has prior history of Echocardiogram examinations, most                  recent 10/13/2022. CHF, CAD, Abnormal ECG, Stroke,                  Signs/Symptoms:Dizziness/Lightheadedness; Risk                  Factors:Diabetes, Dyslipidemia and Former Smoker.  Sonographer:     Sheralyn Boatman RDCS Referring Phys:  [id number] BRIAN S CRENSHAW Diagnosing Phys: Olga Millers MD PROCEDURE: After discussion of the risks and benefits of a TEE, an informed consent was obtained from the patient. The transesophogeal probe was passed without difficulty through the esophogus of the patient. Imaged were obtained with the patient in a left lateral decubitus position. Sedation performed by different physician. The patient was monitored while under deep sedation. Anesthestetic sedation was provided intravenously by Anesthesiology: 120mg  of Propofol. The patient's vital signs; including heart rate, blood pressure, and oxygen saturation; remained stable throughout the procedure. The patient developed no complications during the procedure.  IMPRESSIONS  1. Nodular density noted on left coronary cusp (possible nodular calcification); mobile density on right coronary cusp (vegetation vs lambl's excrescenc; less likely fibroelastoma).  2. Left ventricular ejection fraction, by estimation, is 60 to 65%. The left ventricle has normal function. There is moderate concentric left ventricular hypertrophy.  3. Right ventricular systolic function is normal. The right ventricular size is normal.  4. No left atrial/left atrial  34  ALKPHOS 72 61  BILITOT 0.3 0.3   Lipids  Recent Labs  Lab 10/12/22 0613  CHOL 139  TRIG 79  HDL 79  LDLCALC 44  CHOLHDL 1.8    Hematology Recent Labs  Lab 10/11/22 1902 10/12/22 0613 11-01-2022 0723  WBC 9.1 8.5 7.5  RBC 3.47* 3.62* 3.43*  HGB 10.7* 11.0* 10.8*  HCT 32.3* 33.1* 32.4*  MCV 93.1 91.4 94.5  MCH 30.8 30.4 31.5  MCHC 33.1 33.2 33.3  RDW 12.7 12.4 12.3  PLT 231 318 288   Thyroid No results for input(s): "TSH", "FREET4" in the last 168 hours.  BNPNo results for input(s): "BNP", "PROBNP" in the last 168 hours.  DDimer No results for input(s): "DDIMER" in the last 168 hours.   Radiology/Studies:  ECHO TEE  Result Date: Nov 01, 2022    TRANSESOPHOGEAL ECHO REPORT   Patient Name:   Sarah [name] Date of Exam: 11/01/2022 Medical Rec #:  [id number]            Height:       60.0 in Accession #:    [id number]           Weight:       112.4 lb Date of Birth:  25-Jun-1945            BSA:          1.461 m Patient Age:    77 years             BP:            176/70 mmHg Patient Gender: F                    HR:           92 bpm. Exam Location:  Inpatient Procedure: Transesophageal Echo, 3D Echo, Cardiac Doppler and Color Doppler Indications:     I35.8 Other nonrheumatic aortic valve disorders. Stroke  History:         Patient has prior history of Echocardiogram examinations, most                  recent 10/13/2022. CHF, CAD, Abnormal ECG, Stroke,                  Signs/Symptoms:Dizziness/Lightheadedness; Risk                  Factors:Diabetes, Dyslipidemia and Former Smoker.  Sonographer:     Sheralyn Boatman RDCS Referring Phys:  [id number] BRIAN S CRENSHAW Diagnosing Phys: Olga Millers MD PROCEDURE: After discussion of the risks and benefits of a TEE, an informed consent was obtained from the patient. The transesophogeal probe was passed without difficulty through the esophogus of the patient. Imaged were obtained with the patient in a left lateral decubitus position. Sedation performed by different physician. The patient was monitored while under deep sedation. Anesthestetic sedation was provided intravenously by Anesthesiology: 120mg  of Propofol. The patient's vital signs; including heart rate, blood pressure, and oxygen saturation; remained stable throughout the procedure. The patient developed no complications during the procedure.  IMPRESSIONS  1. Nodular density noted on left coronary cusp (possible nodular calcification); mobile density on right coronary cusp (vegetation vs lambl's excrescenc; less likely fibroelastoma).  2. Left ventricular ejection fraction, by estimation, is 60 to 65%. The left ventricle has normal function. There is moderate concentric left ventricular hypertrophy.  3. Right ventricular systolic function is normal. The right ventricular size is normal.  4. No left atrial/left atrial  Cardiology Consultation   Patient ID: Sarah [name] MRN: [id number]; DOB: [birth year]  Admit date: 10/11/2022 Date of Consult: 10/15/2022  PCP:  [place], [name], MD   [facility] Providers Cardiologist:  Christell Constant, MD  Cardiology APP:  Dyann Kief, PA-C  {  Patient Profile:   Sarah [name] is a 77 y.o. female with a history of CAD s/p DES in RCA in 03/2020,  CVA x2 (2018 and then more recently in 10/2021) s/p loop recorder with no detected arrhythmias, mild carotid stenosis, hypertension, hyperlipidemia, type 2 diabetes mellitus, and bilateral carpal tunnel syndrome who is being seen 10/15/2022 for the evaluation of possible valve vegetation at the request of Dr. Lowell Guitar.  History of Present Illness:   Ms. Schnitzer is a 77 year old female with the above history who is followed by Dr. Raynelle Jan.  Patient was referred to Dr. Raynelle Jan in 2022 for further evaluation of dyspnea on exertion. There was concern that this could be an anginal equivalent so coronary CTA was ordered and showed a coronary calcium score of 25 (59th percentile for age and sex) and multivessel CAD with mild LAD disease and moderate to severe LCX and RCA disease. This led to a LHC in 03/2020 which showed 90% stenosis of mid RCA and otherwise mild to moderate non-obstructive disease. She underwent a successful PCI with DES to the RCA lesion. She was then admitted in 10/2021 for an acute CVA. Echo showed LVEF of 60-65% with normal wall motion, moderate LVH, and grade 1 diastolic dysfunction as well as mild MR. Carotid dopplers showed mild disease (1-39%) stenosis of bilateral ICAs. Patient had a loop recorder placed remotely that had already reached end of life; therefore, outpatient monitor was ordered and showed normal sinus rhythm with short runs of NSVT but no atrial fibrillation/ flutter. He was referred to Dr. Nelly Laurence in 06/2022 and underwent extraction of old loop recorder and  implantation of a new one at that time.   Patient presented to the ED on 10/11/2022 with weakness and was found to have acute multifocal stroke. Brain MRI showed small acute infarcts scattered in the left cerebral hemisphere and left cerebellum as well as extensive chronic small vessel disease and right cerebral cortex infarcts. Head/ neck CTA showed no emergent finding and atherosclerosis with moderate to advanced bilateral MCA stenosis (worse on the right where the chronic inferior division infarction) but no flow reducing stenosis or embolic source seen in the neck. TTE showed LVEF of 65-70% with normal wall motion, mild LVH, and grade 1 diastolic dysfunction as well as a hyperechoic mass on the left coronary cusp measuring 4x82mm that had a very short and fairly broad attachment to the leaflet and was only slightly mobile. TEE was done for further evaluation which showed  nodular density on the left coronary cusp (possible nodular calcification) as well as a mobile density on the right coronary cusp. Differential includes vegetation vs lambl's excresence (fibroelastoma was felt to be less likely). Blood cultures are negative to date. Cardiology consulted for assistance?  At the time of this evaluation, patient is resting comfortably in no acute distress. She states she presented with sudden onset of generalized weakness (more so on the right side) and falls that started on 10/10/2022. She has been doing okay from a cardiac standpoint. She denies any chest pain. She reports some mild dyspnea with exertion particularly when walking up the stairs. However, no shortness of breath at rest. No orthopnea, PND, or edema. She  34  ALKPHOS 72 61  BILITOT 0.3 0.3   Lipids  Recent Labs  Lab 10/12/22 0613  CHOL 139  TRIG 79  HDL 79  LDLCALC 44  CHOLHDL 1.8    Hematology Recent Labs  Lab 10/11/22 1902 10/12/22 0613 11-01-2022 0723  WBC 9.1 8.5 7.5  RBC 3.47* 3.62* 3.43*  HGB 10.7* 11.0* 10.8*  HCT 32.3* 33.1* 32.4*  MCV 93.1 91.4 94.5  MCH 30.8 30.4 31.5  MCHC 33.1 33.2 33.3  RDW 12.7 12.4 12.3  PLT 231 318 288   Thyroid No results for input(s): "TSH", "FREET4" in the last 168 hours.  BNPNo results for input(s): "BNP", "PROBNP" in the last 168 hours.  DDimer No results for input(s): "DDIMER" in the last 168 hours.   Radiology/Studies:  ECHO TEE  Result Date: Nov 01, 2022    TRANSESOPHOGEAL ECHO REPORT   Patient Name:   Sarah [name] Date of Exam: 11/01/2022 Medical Rec #:  [id number]            Height:       60.0 in Accession #:    [id number]           Weight:       112.4 lb Date of Birth:  25-Jun-1945            BSA:          1.461 m Patient Age:    77 years             BP:            176/70 mmHg Patient Gender: F                    HR:           92 bpm. Exam Location:  Inpatient Procedure: Transesophageal Echo, 3D Echo, Cardiac Doppler and Color Doppler Indications:     I35.8 Other nonrheumatic aortic valve disorders. Stroke  History:         Patient has prior history of Echocardiogram examinations, most                  recent 10/13/2022. CHF, CAD, Abnormal ECG, Stroke,                  Signs/Symptoms:Dizziness/Lightheadedness; Risk                  Factors:Diabetes, Dyslipidemia and Former Smoker.  Sonographer:     Sheralyn Boatman RDCS Referring Phys:  [id number] BRIAN S CRENSHAW Diagnosing Phys: Olga Millers MD PROCEDURE: After discussion of the risks and benefits of a TEE, an informed consent was obtained from the patient. The transesophogeal probe was passed without difficulty through the esophogus of the patient. Imaged were obtained with the patient in a left lateral decubitus position. Sedation performed by different physician. The patient was monitored while under deep sedation. Anesthestetic sedation was provided intravenously by Anesthesiology: 120mg  of Propofol. The patient's vital signs; including heart rate, blood pressure, and oxygen saturation; remained stable throughout the procedure. The patient developed no complications during the procedure.  IMPRESSIONS  1. Nodular density noted on left coronary cusp (possible nodular calcification); mobile density on right coronary cusp (vegetation vs lambl's excrescenc; less likely fibroelastoma).  2. Left ventricular ejection fraction, by estimation, is 60 to 65%. The left ventricle has normal function. There is moderate concentric left ventricular hypertrophy.  3. Right ventricular systolic function is normal. The right ventricular size is normal.  4. No left atrial/left atrial  appendage thrombus was detected. The LAA emptying velocity was 77 cm/s.  5. The mitral valve is normal in structure. Trivial mitral valve regurgitation. No evidence of  mitral stenosis.  6. Mobile echodensity present right coronary cusp; nodular density present left coronary cusp.. The aortic valve is abnormal. Aortic valve regurgitation is trivial. No aortic stenosis is present.  7. The pulmonic valve was abnormal.  8. There is Moderate (Grade III) plaque involving the descending aorta.  9. Agitated saline contrast bubble study was negative, with no evidence of any interatrial shunt. FINDINGS  Left Ventricle: Left ventricular ejection fraction, by estimation, is 60 to 65%. The left ventricle has normal function. The left ventricular internal cavity size was normal in size. There is moderate concentric left ventricular hypertrophy. Right Ventricle: The right ventricular size is normal. Right ventricular systolic function is normal. Left Atrium: Left atrial size was normal in size. No left atrial/left atrial appendage thrombus was detected. The LAA emptying velocity was 77 cm/s. Right Atrium: Right atrial size was normal in size. Pericardium: Trivial pericardial effusion is present. Mitral Valve: The mitral valve is normal in structure. Trivial mitral valve regurgitation. No evidence of mitral valve stenosis. Tricuspid Valve: The tricuspid valve is normal in structure. Tricuspid valve regurgitation is trivial. No evidence of tricuspid stenosis. Aortic Valve: Mobile echodensity present right coronary cusp; nodular density present left coronary cusp. The aortic valve is abnormal. Aortic valve regurgitation is trivial. No aortic stenosis is present. Pulmonic Valve: The pulmonic valve was abnormal. Pulmonic valve regurgitation is trivial. No evidence of pulmonic stenosis. Aorta: The aortic root and ascending aorta are structurally normal, with no evidence of dilitation. There is moderate (Grade III) plaque involving the descending aorta. IAS/Shunts: No atrial level shunt detected by color flow Doppler. Agitated saline contrast was given intravenously to evaluate for intracardiac  shunting. Agitated saline contrast bubble study was negative, with no evidence of any interatrial shunt. Additional Comments: Nodular density noted on left coronary cusp (possible nodular calcification); mobile density on right coronary cusp (vegetation vs lambl's excrescenc; less likely fibroelastoma). Spectral Doppler performed.  AORTA Ao Asc diam: 2.80 cm Olga Millers MD Electronically signed by Olga Millers MD Signature Date/Time: 10/14/2022/3:02:34 PM    Final    EP STUDY  Result Date: 10/14/2022 See surgical note for result.  ECHOCARDIOGRAM COMPLETE  Result Date: 10/13/2022    ECHOCARDIOGRAM REPORT   Patient Name:   Sarah [name] Date of Exam: 10/13/2022 Medical Rec #:  [id number]            Height:       60.0 in Accession #:    [id number]           Weight:       112.4 lb Date of Birth:  [birth year]            BSA:          1.461 m Patient Age:    77 years             BP:           108/80 mmHg Patient Gender: F                    HR:           77 bpm. Exam Location:  Inpatient Procedure: 2D Echo, Cardiac Doppler and Color Doppler Indications:    Stroke I63.9  History:        Patient has prior history of Echocardiogram examinations, most  34  ALKPHOS 72 61  BILITOT 0.3 0.3   Lipids  Recent Labs  Lab 10/12/22 0613  CHOL 139  TRIG 79  HDL 79  LDLCALC 44  CHOLHDL 1.8    Hematology Recent Labs  Lab 10/11/22 1902 10/12/22 0613 11-01-2022 0723  WBC 9.1 8.5 7.5  RBC 3.47* 3.62* 3.43*  HGB 10.7* 11.0* 10.8*  HCT 32.3* 33.1* 32.4*  MCV 93.1 91.4 94.5  MCH 30.8 30.4 31.5  MCHC 33.1 33.2 33.3  RDW 12.7 12.4 12.3  PLT 231 318 288   Thyroid No results for input(s): "TSH", "FREET4" in the last 168 hours.  BNPNo results for input(s): "BNP", "PROBNP" in the last 168 hours.  DDimer No results for input(s): "DDIMER" in the last 168 hours.   Radiology/Studies:  ECHO TEE  Result Date: Nov 01, 2022    TRANSESOPHOGEAL ECHO REPORT   Patient Name:   Sarah [name] Date of Exam: 11/01/2022 Medical Rec #:  [id number]            Height:       60.0 in Accession #:    [id number]           Weight:       112.4 lb Date of Birth:  25-Jun-1945            BSA:          1.461 m Patient Age:    77 years             BP:            176/70 mmHg Patient Gender: F                    HR:           92 bpm. Exam Location:  Inpatient Procedure: Transesophageal Echo, 3D Echo, Cardiac Doppler and Color Doppler Indications:     I35.8 Other nonrheumatic aortic valve disorders. Stroke  History:         Patient has prior history of Echocardiogram examinations, most                  recent 10/13/2022. CHF, CAD, Abnormal ECG, Stroke,                  Signs/Symptoms:Dizziness/Lightheadedness; Risk                  Factors:Diabetes, Dyslipidemia and Former Smoker.  Sonographer:     Sheralyn Boatman RDCS Referring Phys:  [id number] BRIAN S CRENSHAW Diagnosing Phys: Olga Millers MD PROCEDURE: After discussion of the risks and benefits of a TEE, an informed consent was obtained from the patient. The transesophogeal probe was passed without difficulty through the esophogus of the patient. Imaged were obtained with the patient in a left lateral decubitus position. Sedation performed by different physician. The patient was monitored while under deep sedation. Anesthestetic sedation was provided intravenously by Anesthesiology: 120mg  of Propofol. The patient's vital signs; including heart rate, blood pressure, and oxygen saturation; remained stable throughout the procedure. The patient developed no complications during the procedure.  IMPRESSIONS  1. Nodular density noted on left coronary cusp (possible nodular calcification); mobile density on right coronary cusp (vegetation vs lambl's excrescenc; less likely fibroelastoma).  2. Left ventricular ejection fraction, by estimation, is 60 to 65%. The left ventricle has normal function. There is moderate concentric left ventricular hypertrophy.  3. Right ventricular systolic function is normal. The right ventricular size is normal.  4. No left atrial/left atrial  appendage thrombus was detected. The LAA emptying velocity was 77 cm/s.  5. The mitral valve is normal in structure. Trivial mitral valve regurgitation. No evidence of  mitral stenosis.  6. Mobile echodensity present right coronary cusp; nodular density present left coronary cusp.. The aortic valve is abnormal. Aortic valve regurgitation is trivial. No aortic stenosis is present.  7. The pulmonic valve was abnormal.  8. There is Moderate (Grade III) plaque involving the descending aorta.  9. Agitated saline contrast bubble study was negative, with no evidence of any interatrial shunt. FINDINGS  Left Ventricle: Left ventricular ejection fraction, by estimation, is 60 to 65%. The left ventricle has normal function. The left ventricular internal cavity size was normal in size. There is moderate concentric left ventricular hypertrophy. Right Ventricle: The right ventricular size is normal. Right ventricular systolic function is normal. Left Atrium: Left atrial size was normal in size. No left atrial/left atrial appendage thrombus was detected. The LAA emptying velocity was 77 cm/s. Right Atrium: Right atrial size was normal in size. Pericardium: Trivial pericardial effusion is present. Mitral Valve: The mitral valve is normal in structure. Trivial mitral valve regurgitation. No evidence of mitral valve stenosis. Tricuspid Valve: The tricuspid valve is normal in structure. Tricuspid valve regurgitation is trivial. No evidence of tricuspid stenosis. Aortic Valve: Mobile echodensity present right coronary cusp; nodular density present left coronary cusp. The aortic valve is abnormal. Aortic valve regurgitation is trivial. No aortic stenosis is present. Pulmonic Valve: The pulmonic valve was abnormal. Pulmonic valve regurgitation is trivial. No evidence of pulmonic stenosis. Aorta: The aortic root and ascending aorta are structurally normal, with no evidence of dilitation. There is moderate (Grade III) plaque involving the descending aorta. IAS/Shunts: No atrial level shunt detected by color flow Doppler. Agitated saline contrast was given intravenously to evaluate for intracardiac  shunting. Agitated saline contrast bubble study was negative, with no evidence of any interatrial shunt. Additional Comments: Nodular density noted on left coronary cusp (possible nodular calcification); mobile density on right coronary cusp (vegetation vs lambl's excrescenc; less likely fibroelastoma). Spectral Doppler performed.  AORTA Ao Asc diam: 2.80 cm Olga Millers MD Electronically signed by Olga Millers MD Signature Date/Time: 10/14/2022/3:02:34 PM    Final    EP STUDY  Result Date: 10/14/2022 See surgical note for result.  ECHOCARDIOGRAM COMPLETE  Result Date: 10/13/2022    ECHOCARDIOGRAM REPORT   Patient Name:   Sarah [name] Date of Exam: 10/13/2022 Medical Rec #:  [id number]            Height:       60.0 in Accession #:    [id number]           Weight:       112.4 lb Date of Birth:  [birth year]            BSA:          1.461 m Patient Age:    77 years             BP:           108/80 mmHg Patient Gender: F                    HR:           77 bpm. Exam Location:  Inpatient Procedure: 2D Echo, Cardiac Doppler and Color Doppler Indications:    Stroke I63.9  History:        Patient has prior history of Echocardiogram examinations, most  appendage thrombus was detected. The LAA emptying velocity was 77 cm/s.  5. The mitral valve is normal in structure. Trivial mitral valve regurgitation. No evidence of  mitral stenosis.  6. Mobile echodensity present right coronary cusp; nodular density present left coronary cusp.. The aortic valve is abnormal. Aortic valve regurgitation is trivial. No aortic stenosis is present.  7. The pulmonic valve was abnormal.  8. There is Moderate (Grade III) plaque involving the descending aorta.  9. Agitated saline contrast bubble study was negative, with no evidence of any interatrial shunt. FINDINGS  Left Ventricle: Left ventricular ejection fraction, by estimation, is 60 to 65%. The left ventricle has normal function. The left ventricular internal cavity size was normal in size. There is moderate concentric left ventricular hypertrophy. Right Ventricle: The right ventricular size is normal. Right ventricular systolic function is normal. Left Atrium: Left atrial size was normal in size. No left atrial/left atrial appendage thrombus was detected. The LAA emptying velocity was 77 cm/s. Right Atrium: Right atrial size was normal in size. Pericardium: Trivial pericardial effusion is present. Mitral Valve: The mitral valve is normal in structure. Trivial mitral valve regurgitation. No evidence of mitral valve stenosis. Tricuspid Valve: The tricuspid valve is normal in structure. Tricuspid valve regurgitation is trivial. No evidence of tricuspid stenosis. Aortic Valve: Mobile echodensity present right coronary cusp; nodular density present left coronary cusp. The aortic valve is abnormal. Aortic valve regurgitation is trivial. No aortic stenosis is present. Pulmonic Valve: The pulmonic valve was abnormal. Pulmonic valve regurgitation is trivial. No evidence of pulmonic stenosis. Aorta: The aortic root and ascending aorta are structurally normal, with no evidence of dilitation. There is moderate (Grade III) plaque involving the descending aorta. IAS/Shunts: No atrial level shunt detected by color flow Doppler. Agitated saline contrast was given intravenously to evaluate for intracardiac  shunting. Agitated saline contrast bubble study was negative, with no evidence of any interatrial shunt. Additional Comments: Nodular density noted on left coronary cusp (possible nodular calcification); mobile density on right coronary cusp (vegetation vs lambl's excrescenc; less likely fibroelastoma). Spectral Doppler performed.  AORTA Ao Asc diam: 2.80 cm Olga Millers MD Electronically signed by Olga Millers MD Signature Date/Time: 10/14/2022/3:02:34 PM    Final    EP STUDY  Result Date: 10/14/2022 See surgical note for result.  ECHOCARDIOGRAM COMPLETE  Result Date: 10/13/2022    ECHOCARDIOGRAM REPORT   Patient Name:   Sarah [name] Date of Exam: 10/13/2022 Medical Rec #:  [id number]            Height:       60.0 in Accession #:    [id number]           Weight:       112.4 lb Date of Birth:  [birth year]            BSA:          1.461 m Patient Age:    77 years             BP:           108/80 mmHg Patient Gender: F                    HR:           77 bpm. Exam Location:  Inpatient Procedure: 2D Echo, Cardiac Doppler and Color Doppler Indications:    Stroke I63.9  History:        Patient has prior history of Echocardiogram examinations, most  Cardiology Consultation   Patient ID: Sarah [name] MRN: [id number]; DOB: [birth year]  Admit date: 10/11/2022 Date of Consult: 10/15/2022  PCP:  [place], [name], MD   [facility] Providers Cardiologist:  Christell Constant, MD  Cardiology APP:  Dyann Kief, PA-C  {  Patient Profile:   Sarah [name] is a 77 y.o. female with a history of CAD s/p DES in RCA in 03/2020,  CVA x2 (2018 and then more recently in 10/2021) s/p loop recorder with no detected arrhythmias, mild carotid stenosis, hypertension, hyperlipidemia, type 2 diabetes mellitus, and bilateral carpal tunnel syndrome who is being seen 10/15/2022 for the evaluation of possible valve vegetation at the request of Dr. Lowell Guitar.  History of Present Illness:   Ms. Schnitzer is a 77 year old female with the above history who is followed by Dr. Raynelle Jan.  Patient was referred to Dr. Raynelle Jan in 2022 for further evaluation of dyspnea on exertion. There was concern that this could be an anginal equivalent so coronary CTA was ordered and showed a coronary calcium score of 25 (59th percentile for age and sex) and multivessel CAD with mild LAD disease and moderate to severe LCX and RCA disease. This led to a LHC in 03/2020 which showed 90% stenosis of mid RCA and otherwise mild to moderate non-obstructive disease. She underwent a successful PCI with DES to the RCA lesion. She was then admitted in 10/2021 for an acute CVA. Echo showed LVEF of 60-65% with normal wall motion, moderate LVH, and grade 1 diastolic dysfunction as well as mild MR. Carotid dopplers showed mild disease (1-39%) stenosis of bilateral ICAs. Patient had a loop recorder placed remotely that had already reached end of life; therefore, outpatient monitor was ordered and showed normal sinus rhythm with short runs of NSVT but no atrial fibrillation/ flutter. He was referred to Dr. Nelly Laurence in 06/2022 and underwent extraction of old loop recorder and  implantation of a new one at that time.   Patient presented to the ED on 10/11/2022 with weakness and was found to have acute multifocal stroke. Brain MRI showed small acute infarcts scattered in the left cerebral hemisphere and left cerebellum as well as extensive chronic small vessel disease and right cerebral cortex infarcts. Head/ neck CTA showed no emergent finding and atherosclerosis with moderate to advanced bilateral MCA stenosis (worse on the right where the chronic inferior division infarction) but no flow reducing stenosis or embolic source seen in the neck. TTE showed LVEF of 65-70% with normal wall motion, mild LVH, and grade 1 diastolic dysfunction as well as a hyperechoic mass on the left coronary cusp measuring 4x82mm that had a very short and fairly broad attachment to the leaflet and was only slightly mobile. TEE was done for further evaluation which showed  nodular density on the left coronary cusp (possible nodular calcification) as well as a mobile density on the right coronary cusp. Differential includes vegetation vs lambl's excresence (fibroelastoma was felt to be less likely). Blood cultures are negative to date. Cardiology consulted for assistance?  At the time of this evaluation, patient is resting comfortably in no acute distress. She states she presented with sudden onset of generalized weakness (more so on the right side) and falls that started on 10/10/2022. She has been doing okay from a cardiac standpoint. She denies any chest pain. She reports some mild dyspnea with exertion particularly when walking up the stairs. However, no shortness of breath at rest. No orthopnea, PND, or edema. She  34  ALKPHOS 72 61  BILITOT 0.3 0.3   Lipids  Recent Labs  Lab 10/12/22 0613  CHOL 139  TRIG 79  HDL 79  LDLCALC 44  CHOLHDL 1.8    Hematology Recent Labs  Lab 10/11/22 1902 10/12/22 0613 11-01-2022 0723  WBC 9.1 8.5 7.5  RBC 3.47* 3.62* 3.43*  HGB 10.7* 11.0* 10.8*  HCT 32.3* 33.1* 32.4*  MCV 93.1 91.4 94.5  MCH 30.8 30.4 31.5  MCHC 33.1 33.2 33.3  RDW 12.7 12.4 12.3  PLT 231 318 288   Thyroid No results for input(s): "TSH", "FREET4" in the last 168 hours.  BNPNo results for input(s): "BNP", "PROBNP" in the last 168 hours.  DDimer No results for input(s): "DDIMER" in the last 168 hours.   Radiology/Studies:  ECHO TEE  Result Date: Nov 01, 2022    TRANSESOPHOGEAL ECHO REPORT   Patient Name:   Sarah [name] Date of Exam: 11/01/2022 Medical Rec #:  [id number]            Height:       60.0 in Accession #:    [id number]           Weight:       112.4 lb Date of Birth:  25-Jun-1945            BSA:          1.461 m Patient Age:    77 years             BP:            176/70 mmHg Patient Gender: F                    HR:           92 bpm. Exam Location:  Inpatient Procedure: Transesophageal Echo, 3D Echo, Cardiac Doppler and Color Doppler Indications:     I35.8 Other nonrheumatic aortic valve disorders. Stroke  History:         Patient has prior history of Echocardiogram examinations, most                  recent 10/13/2022. CHF, CAD, Abnormal ECG, Stroke,                  Signs/Symptoms:Dizziness/Lightheadedness; Risk                  Factors:Diabetes, Dyslipidemia and Former Smoker.  Sonographer:     Sheralyn Boatman RDCS Referring Phys:  [id number] BRIAN S CRENSHAW Diagnosing Phys: Olga Millers MD PROCEDURE: After discussion of the risks and benefits of a TEE, an informed consent was obtained from the patient. The transesophogeal probe was passed without difficulty through the esophogus of the patient. Imaged were obtained with the patient in a left lateral decubitus position. Sedation performed by different physician. The patient was monitored while under deep sedation. Anesthestetic sedation was provided intravenously by Anesthesiology: 120mg  of Propofol. The patient's vital signs; including heart rate, blood pressure, and oxygen saturation; remained stable throughout the procedure. The patient developed no complications during the procedure.  IMPRESSIONS  1. Nodular density noted on left coronary cusp (possible nodular calcification); mobile density on right coronary cusp (vegetation vs lambl's excrescenc; less likely fibroelastoma).  2. Left ventricular ejection fraction, by estimation, is 60 to 65%. The left ventricle has normal function. There is moderate concentric left ventricular hypertrophy.  3. Right ventricular systolic function is normal. The right ventricular size is normal.  4. No left atrial/left atrial  appendage thrombus was detected. The LAA emptying velocity was 77 cm/s.  5. The mitral valve is normal in structure. Trivial mitral valve regurgitation. No evidence of  mitral stenosis.  6. Mobile echodensity present right coronary cusp; nodular density present left coronary cusp.. The aortic valve is abnormal. Aortic valve regurgitation is trivial. No aortic stenosis is present.  7. The pulmonic valve was abnormal.  8. There is Moderate (Grade III) plaque involving the descending aorta.  9. Agitated saline contrast bubble study was negative, with no evidence of any interatrial shunt. FINDINGS  Left Ventricle: Left ventricular ejection fraction, by estimation, is 60 to 65%. The left ventricle has normal function. The left ventricular internal cavity size was normal in size. There is moderate concentric left ventricular hypertrophy. Right Ventricle: The right ventricular size is normal. Right ventricular systolic function is normal. Left Atrium: Left atrial size was normal in size. No left atrial/left atrial appendage thrombus was detected. The LAA emptying velocity was 77 cm/s. Right Atrium: Right atrial size was normal in size. Pericardium: Trivial pericardial effusion is present. Mitral Valve: The mitral valve is normal in structure. Trivial mitral valve regurgitation. No evidence of mitral valve stenosis. Tricuspid Valve: The tricuspid valve is normal in structure. Tricuspid valve regurgitation is trivial. No evidence of tricuspid stenosis. Aortic Valve: Mobile echodensity present right coronary cusp; nodular density present left coronary cusp. The aortic valve is abnormal. Aortic valve regurgitation is trivial. No aortic stenosis is present. Pulmonic Valve: The pulmonic valve was abnormal. Pulmonic valve regurgitation is trivial. No evidence of pulmonic stenosis. Aorta: The aortic root and ascending aorta are structurally normal, with no evidence of dilitation. There is moderate (Grade III) plaque involving the descending aorta. IAS/Shunts: No atrial level shunt detected by color flow Doppler. Agitated saline contrast was given intravenously to evaluate for intracardiac  shunting. Agitated saline contrast bubble study was negative, with no evidence of any interatrial shunt. Additional Comments: Nodular density noted on left coronary cusp (possible nodular calcification); mobile density on right coronary cusp (vegetation vs lambl's excrescenc; less likely fibroelastoma). Spectral Doppler performed.  AORTA Ao Asc diam: 2.80 cm Olga Millers MD Electronically signed by Olga Millers MD Signature Date/Time: 10/14/2022/3:02:34 PM    Final    EP STUDY  Result Date: 10/14/2022 See surgical note for result.  ECHOCARDIOGRAM COMPLETE  Result Date: 10/13/2022    ECHOCARDIOGRAM REPORT   Patient Name:   Sarah [name] Date of Exam: 10/13/2022 Medical Rec #:  [id number]            Height:       60.0 in Accession #:    [id number]           Weight:       112.4 lb Date of Birth:  [birth year]            BSA:          1.461 m Patient Age:    77 years             BP:           108/80 mmHg Patient Gender: F                    HR:           77 bpm. Exam Location:  Inpatient Procedure: 2D Echo, Cardiac Doppler and Color Doppler Indications:    Stroke I63.9  History:        Patient has prior history of Echocardiogram examinations, most

## 2022-10-15 NOTE — Progress Notes (Signed)
PROGRESS NOTE    Sarah [name]  ZOX:[id number] DOB: July 04, 1945 DOA: 10/11/2022 PCP: [place], [name], MD  Chief Complaint  Patient presents with   Weakness    Brief Narrative:   77 year old female with history of prior CVA with residual left-sided hemiparesis, hypertension, hyperlipidemia, diabetes mellitus type 2, CAD who has loop recorder in place.  [facility] with 2-day history of right leg weakness and falls.  She is on Plavix due to history of prior stroke. CT head was negative, MRI of the brain showed scattered small acute infarcts along the posterior left frontal cortex/white matter in the left occipital cortex and left lower cerebellum.  Neurology was consulted  Assessment & Plan:   Principal Problem:   Stroke-like symptoms Active Problems:   Generalized weakness   Stroke (cerebrum) (HCC)   Acute ischemic stroke (HCC)  Acute multifocal ischemic stroke -MRI brain with small acute infarcts scattered in the L cerebral hemisphere and L cerebellum -CTA head/neck without emergent finding, atherosclerosis with moderate to advanced bilateral MCA stenosis, worse on R where there is chronic inferior division infarction - neurology c/s, appreciate recs -> ASA and brilinta x1 month, then aspirin and plavix (if stroke while on DAPT need to consider anticoagulation), no afib on loop recorder - echo with hyperechoic mass on L coronary cusp measuring 4x7 mm (ddx includes healed vegetation vs atypical myoma vs papillary fibroelastoma vs degerenative change related nodule)  - TEE with mobile density on R coronary cusp (vegetation vs lambl's excrescence) - will continue to discuss with cards -Continue rosuvastatin, Zetia -PT/OT/SLP   Mobile Density on R Coronary Cusp - follow blood cultures, lower suspicion for infection - no white count or fevers - will continue to discuss with cards -> recommending indefinite DAPT after 1 month of aspirin and brilinta (if  recurrent CVA while on DAPT, need to consider anticoagulation instead)  Hypertension -amlodipine, losartan   Hyperlipidemia -Continue statin   Diabetes mellitus type 2 -Continue Semglee, sliding scale insulin with NovoLog   CAD -Continue statin, Plavix   DVT prophylaxis: SCD Code Status: full Family Communication: brother and SIL at bedside, sister over phone Disposition:   Status is: Inpatient Remains inpatient appropriate because: need for neurology clearance, TEE   Consultants:  neurology  Procedures:  Echo IMPRESSIONS     1. Left ventricular ejection fraction, by estimation, is 65 to 70%. The  left ventricle has normal function. The left ventricle has no regional  wall motion abnormalities. There is mild concentric left ventricular  hypertrophy. Left ventricular diastolic  parameters are consistent with Grade I diastolic dysfunction (impaired  relaxation).   2. Right ventricular systolic function is normal. The right ventricular  size is normal.   3. Left atrial size was mildly dilated.   4. The mitral valve is normal in structure. Trivial mitral valve  regurgitation. No evidence of mitral stenosis.   5. There is Alaylah Heatherington hyperechoic mass on the left coronary cusp measuring 4 x 7  mm. It has Nailea Whitehorn very short and fairly broad attachment to the leaflet and is  only slightly mobile. Diffierential diagnosis is broad and includes healed  vegetation, atypical myxoma,  papillary fibroelastoma and even degenerative change-related nodule. The  aortic valve is tricuspid. There is mild thickening of the aortic valve.  Aortic valve regurgitation is trivial. Aortic valve  sclerosis/calcification is present, without any evidence   of aortic stenosis.   6. The inferior vena cava is normal in size with greater than  PROGRESS NOTE    Sarah [name]  ZOX:[id number] DOB: July 04, 1945 DOA: 10/11/2022 PCP: [place], [name], MD  Chief Complaint  Patient presents with   Weakness    Brief Narrative:   77 year old female with history of prior CVA with residual left-sided hemiparesis, hypertension, hyperlipidemia, diabetes mellitus type 2, CAD who has loop recorder in place.  [facility] with 2-day history of right leg weakness and falls.  She is on Plavix due to history of prior stroke. CT head was negative, MRI of the brain showed scattered small acute infarcts along the posterior left frontal cortex/white matter in the left occipital cortex and left lower cerebellum.  Neurology was consulted  Assessment & Plan:   Principal Problem:   Stroke-like symptoms Active Problems:   Generalized weakness   Stroke (cerebrum) (HCC)   Acute ischemic stroke (HCC)  Acute multifocal ischemic stroke -MRI brain with small acute infarcts scattered in the L cerebral hemisphere and L cerebellum -CTA head/neck without emergent finding, atherosclerosis with moderate to advanced bilateral MCA stenosis, worse on R where there is chronic inferior division infarction - neurology c/s, appreciate recs -> ASA and brilinta x1 month, then aspirin and plavix (if stroke while on DAPT need to consider anticoagulation), no afib on loop recorder - echo with hyperechoic mass on L coronary cusp measuring 4x7 mm (ddx includes healed vegetation vs atypical myoma vs papillary fibroelastoma vs degerenative change related nodule)  - TEE with mobile density on R coronary cusp (vegetation vs lambl's excrescence) - will continue to discuss with cards -Continue rosuvastatin, Zetia -PT/OT/SLP   Mobile Density on R Coronary Cusp - follow blood cultures, lower suspicion for infection - no white count or fevers - will continue to discuss with cards -> recommending indefinite DAPT after 1 month of aspirin and brilinta (if  recurrent CVA while on DAPT, need to consider anticoagulation instead)  Hypertension -amlodipine, losartan   Hyperlipidemia -Continue statin   Diabetes mellitus type 2 -Continue Semglee, sliding scale insulin with NovoLog   CAD -Continue statin, Plavix   DVT prophylaxis: SCD Code Status: full Family Communication: brother and SIL at bedside, sister over phone Disposition:   Status is: Inpatient Remains inpatient appropriate because: need for neurology clearance, TEE   Consultants:  neurology  Procedures:  Echo IMPRESSIONS     1. Left ventricular ejection fraction, by estimation, is 65 to 70%. The  left ventricle has normal function. The left ventricle has no regional  wall motion abnormalities. There is mild concentric left ventricular  hypertrophy. Left ventricular diastolic  parameters are consistent with Grade I diastolic dysfunction (impaired  relaxation).   2. Right ventricular systolic function is normal. The right ventricular  size is normal.   3. Left atrial size was mildly dilated.   4. The mitral valve is normal in structure. Trivial mitral valve  regurgitation. No evidence of mitral stenosis.   5. There is Alaylah Heatherington hyperechoic mass on the left coronary cusp measuring 4 x 7  mm. It has Nailea Whitehorn very short and fairly broad attachment to the leaflet and is  only slightly mobile. Diffierential diagnosis is broad and includes healed  vegetation, atypical myxoma,  papillary fibroelastoma and even degenerative change-related nodule. The  aortic valve is tricuspid. There is mild thickening of the aortic valve.  Aortic valve regurgitation is trivial. Aortic valve  sclerosis/calcification is present, without any evidence   of aortic stenosis.   6. The inferior vena cava is normal in size with greater than  Report Status PENDING  Incomplete  Culture, blood (Routine X 2) w Reflex to ID Panel     Status: None (Preliminary result)   Collection Time: 10/14/22  7:14 PM   Specimen: BLOOD LEFT HAND  Result Value Ref Range Status   Specimen Description BLOOD LEFT HAND  Final   Special Requests   Final    BOTTLES DRAWN AEROBIC AND ANAEROBIC Blood Culture adequate volume   Culture   Final    NO GROWTH < 12 HOURS Performed at [facility], [address], [place], [place] [address]    Report Status PENDING  Incomplete         Radiology Studies: ECHO TEE  Result Date: 10/14/2022    TRANSESOPHOGEAL ECHO REPORT   Patient Name:   TIEA MANNINEN Date of Exam: 10/14/2022 Medical Rec #:  [id number]            Height:       60.0 in Accession #:    [id number]           Weight:       112.4 lb Date of Birth:  March 03, 1945            BSA:          1.461 m Patient Age:    77 years             BP:           176/70 mmHg Patient Gender: F                    HR:           92 bpm. Exam Location:  Inpatient Procedure: Transesophageal Echo, 3D Echo, Cardiac Doppler and Color  Doppler Indications:     I35.8 Other nonrheumatic aortic valve disorders. Stroke  History:         Patient has prior history of Echocardiogram examinations, most                  recent 10/13/2022. CHF, CAD, Abnormal ECG, Stroke,                  Signs/Symptoms:Dizziness/Lightheadedness; Risk                  Factors:Diabetes, Dyslipidemia and Former Smoker.  Sonographer:     Sheralyn Boatman RDCS Referring Phys:  [id number] BRIAN S CRENSHAW Diagnosing Phys: Olga Millers MD PROCEDURE: After discussion of the risks and benefits of Tyliek Timberman TEE, an informed consent was obtained from the patient. The transesophogeal probe was passed without difficulty through the esophogus of the patient. Imaged were obtained with the patient in Emara Lichter left lateral decubitus position. Sedation performed by different physician. The patient was monitored while under deep sedation. Anesthestetic sedation was provided intravenously by Anesthesiology: 120mg  of Propofol. The patient's vital signs; including heart rate, blood pressure, and oxygen saturation; remained stable throughout the procedure. The patient developed no complications during the procedure.  IMPRESSIONS  1. Nodular density noted on left coronary cusp (possible nodular calcification); mobile density on right coronary cusp (vegetation vs lambl's excrescenc; less likely fibroelastoma).  2. Left ventricular ejection fraction, by estimation, is 60 to 65%. The left ventricle has normal function. There is moderate concentric left ventricular hypertrophy.  3. Right ventricular systolic function is normal. The right ventricular size is normal.  4. No left atrial/left atrial appendage thrombus was detected. The LAA emptying velocity was 77 cm/s.  5. The mitral valve is normal  PROGRESS NOTE    Sarah [name]  ZOX:[id number] DOB: July 04, 1945 DOA: 10/11/2022 PCP: [place], [name], MD  Chief Complaint  Patient presents with   Weakness    Brief Narrative:   77 year old female with history of prior CVA with residual left-sided hemiparesis, hypertension, hyperlipidemia, diabetes mellitus type 2, CAD who has loop recorder in place.  [facility]ially [facility] with 2-day history of right leg weakness and falls.  She is on Plavix due to history of prior stroke. CT head was negative, MRI of the brain showed scattered small acute infarcts along the posterior left frontal cortex/white matter in the left occipital cortex and left lower cerebellum.  Neurology was consulted  Assessment & Plan:   Principal Problem:   Stroke-like symptoms Active Problems:   Generalized weakness   Stroke (cerebrum) (HCC)   Acute ischemic stroke (HCC)  Acute multifocal ischemic stroke -MRI brain with small acute infarcts scattered in the L cerebral hemisphere and L cerebellum -CTA head/neck without emergent finding, atherosclerosis with moderate to advanced bilateral MCA stenosis, worse on R where there is chronic inferior division infarction - neurology c/s, appreciate recs -> ASA and brilinta x1 month, then aspirin and plavix (if stroke while on DAPT need to consider anticoagulation), no afib on loop recorder - echo with hyperechoic mass on L coronary cusp measuring 4x7 mm (ddx includes healed vegetation vs atypical myoma vs papillary fibroelastoma vs degerenative change related nodule)  - TEE with mobile density on R coronary cusp (vegetation vs lambl's excrescence) - will continue to discuss with cards -Continue rosuvastatin, Zetia -PT/OT/SLP   Mobile Density on R Coronary Cusp - follow blood cultures, lower suspicion for infection - no white count or fevers - will continue to discuss with cards -> recommending indefinite DAPT after 1 month of aspirin and brilinta (if  recurrent CVA while on DAPT, need to consider anticoagulation instead)  Hypertension -amlodipine, losartan   Hyperlipidemia -Continue statin   Diabetes mellitus type 2 -Continue Semglee, sliding scale insulin with NovoLog   CAD -Continue statin, Plavix   DVT prophylaxis: SCD Code Status: full Family Communication: brother and SIL at bedside, sister over phone Disposition:   Status is: Inpatient Remains inpatient appropriate because: need for neurology clearance, TEE   Consultants:  neurology  Procedures:  Echo IMPRESSIONS     1. Left ventricular ejection fraction, by estimation, is 65 to 70%. The  left ventricle has normal function. The left ventricle has no regional  wall motion abnormalities. There is mild concentric left ventricular  hypertrophy. Left ventricular diastolic  parameters are consistent with Grade I diastolic dysfunction (impaired  relaxation).   2. Right ventricular systolic function is normal. The right ventricular  size is normal.   3. Left atrial size was mildly dilated.   4. The mitral valve is normal in structure. Trivial mitral valve  regurgitation. No evidence of mitral stenosis.   5. There is Alaylah Heatherington hyperechoic mass on the left coronary cusp measuring 4 x 7  mm. It has Nailea Whitehorn very short and fairly broad attachment to the leaflet and is  only slightly mobile. Diffierential diagnosis is broad and includes healed  vegetation, atypical myxoma,  papillary fibroelastoma and even degenerative change-related nodule. The  aortic valve is tricuspid. There is mild thickening of the aortic valve.  Aortic valve regurgitation is trivial. Aortic valve  sclerosis/calcification is present, without any evidence   of aortic stenosis.   6. The inferior vena cava is normal in size with greater than

## 2022-10-15 NOTE — Care Management Important Message (Signed)
Important Message  Patient Details  Name: Sarah [name] MRN: [id number] Date of Birth: [birth year]   Important Message Given:  Yes - Medicare IM     Dorena Bodo 10/15/2022, 2:21 PM

## 2022-10-16 ENCOUNTER — Other Ambulatory Visit (HOSPITAL_COMMUNITY): Payer: Self-pay

## 2022-10-16 DIAGNOSIS — R2990 Unspecified symptoms and signs involving the nervous system: Secondary | ICD-10-CM | POA: Diagnosis not present

## 2022-10-16 LAB — GLUCOSE, CAPILLARY
Glucose-Capillary: 153 mg/dL — ABNORMAL HIGH (ref 70–99)
Glucose-Capillary: 234 mg/dL — ABNORMAL HIGH (ref 70–99)

## 2022-10-16 MED ORDER — TICAGRELOR 90 MG PO TABS
90.0000 mg | ORAL_TABLET | Freq: Two times a day (BID) | ORAL | 0 refills | Status: DC
  Filled 2022-10-16: qty 60, 30d supply, fill #0

## 2022-10-16 MED ORDER — CLOPIDOGREL BISULFATE 75 MG PO TABS: 75.0000 mg | ORAL_TABLET | Freq: Every day | ORAL | 1 refills | Status: DC

## 2022-10-16 MED ORDER — ASPIRIN 81 MG PO TBEC
81.0000 mg | DELAYED_RELEASE_TABLET | Freq: Every day | ORAL | 12 refills | Status: AC
  Filled 2022-10-16: qty 30, 30d supply, fill #0

## 2022-10-16 NOTE — Plan of Care (Signed)
Problem: Education: Goal: Understanding of cardiac disease, CV risk reduction, and recovery process will improve Outcome: Adequate for Discharge Goal: Individualized Educational Video(s) Outcome: Adequate for Discharge   Problem: Activity: Goal: Ability to tolerate increased activity will improve Outcome: Adequate for Discharge   Problem: Cardiac: Goal: Ability to achieve and maintain adequate cardiovascular perfusion will improve Outcome: Adequate for Discharge   Problem: Health Behavior/Discharge Planning: Goal: Ability to safely manage health-related needs after discharge will improve Outcome: Adequate for Discharge   Problem: Education: Goal: Knowledge of disease or condition will improve Outcome: Adequate for Discharge Goal: Knowledge of secondary prevention will improve (MUST DOCUMENT ALL) Outcome: Adequate for Discharge Goal: Knowledge of patient specific risk factors will improve Loraine Leriche N/A or DELETE if not current risk factor) Outcome: Adequate for Discharge   Problem: Ischemic Stroke/TIA Tissue Perfusion: Goal: Complications of ischemic stroke/TIA will be minimized Outcome: Adequate for Discharge   Problem: Coping: Goal: Will verbalize positive feelings about self Outcome: Adequate for Discharge Goal: Will identify appropriate support needs Outcome: Adequate for Discharge   Problem: Health Behavior/Discharge Planning: Goal: Ability to manage health-related needs will improve Outcome: Adequate for Discharge Goal: Goals will be collaboratively established with patient/family Outcome: Adequate for Discharge   Problem: Self-Care: Goal: Ability to participate in self-care as condition permits will improve Outcome: Adequate for Discharge Goal: Verbalization of feelings and concerns over difficulty with self-care will improve Outcome: Adequate for Discharge Goal: Ability to communicate needs accurately will improve Outcome: Adequate for Discharge   Problem:  Nutrition: Goal: Risk of aspiration will decrease Outcome: Adequate for Discharge Goal: Dietary intake will improve Outcome: Adequate for Discharge   Problem: Education: Goal: Knowledge of General Education information will improve Description: Including pain rating scale, medication(s)/side effects and non-pharmacologic comfort measures Outcome: Adequate for Discharge   Problem: Health Behavior/Discharge Planning: Goal: Ability to manage health-related needs will improve Outcome: Adequate for Discharge   Problem: Clinical Measurements: Goal: Ability to maintain clinical measurements within normal limits will improve Outcome: Adequate for Discharge Goal: Will remain free from infection Outcome: Adequate for Discharge Goal: Diagnostic test results will improve Outcome: Adequate for Discharge Goal: Respiratory complications will improve Outcome: Adequate for Discharge Goal: Cardiovascular complication will be avoided Outcome: Adequate for Discharge   Problem: Activity: Goal: Risk for activity intolerance will decrease Outcome: Adequate for Discharge   Problem: Nutrition: Goal: Adequate nutrition will be maintained Outcome: Adequate for Discharge   Problem: Coping: Goal: Level of anxiety will decrease Outcome: Adequate for Discharge   Problem: Elimination: Goal: Will not experience complications related to bowel motility Outcome: Adequate for Discharge Goal: Will not experience complications related to urinary retention Outcome: Adequate for Discharge   Problem: Pain Managment: Goal: General experience of comfort will improve Outcome: Adequate for Discharge   Problem: Safety: Goal: Ability to remain free from injury will improve Outcome: Adequate for Discharge   Problem: Skin Integrity: Goal: Risk for impaired skin integrity will decrease Outcome: Adequate for Discharge   Problem: Education: Goal: Ability to describe self-care measures that may prevent or  decrease complications (Diabetes Survival Skills Education) will improve Outcome: Adequate for Discharge Goal: Individualized Educational Video(s) Outcome: Adequate for Discharge   Problem: Coping: Goal: Ability to adjust to condition or change in health will improve Outcome: Adequate for Discharge   Problem: Fluid Volume: Goal: Ability to maintain a balanced intake and output will improve Outcome: Adequate for Discharge   Problem: Health Behavior/Discharge Planning: Goal: Ability to identify and utilize available resources and services will improve  Outcome: Adequate for Discharge Goal: Ability to manage health-related needs will improve Outcome: Adequate for Discharge   Problem: Metabolic: Goal: Ability to maintain appropriate glucose levels will improve Outcome: Adequate for Discharge   Problem: Nutritional: Goal: Maintenance of adequate nutrition will improve Outcome: Adequate for Discharge Goal: Progress toward achieving an optimal weight will improve Outcome: Adequate for Discharge   Problem: Skin Integrity: Goal: Risk for impaired skin integrity will decrease Outcome: Adequate for Discharge   Problem: Tissue Perfusion: Goal: Adequacy of tissue perfusion will improve Outcome: Adequate for Discharge

## 2022-10-16 NOTE — Discharge Summary (Signed)
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  2. Left ventricular ejection fraction, by estimation, is 60 to 65%. The left ventricle has normal function. There is moderate concentric left ventricular hypertrophy.  3. Right ventricular systolic function is normal. The right ventricular size is normal.  4. No left atrial/left atrial appendage thrombus was detected. The LAA emptying velocity was 77 cm/s.  5. The mitral valve is normal in structure. Trivial mitral valve regurgitation. No evidence of mitral stenosis.   6. Mobile echodensity present right coronary cusp; nodular density present left coronary cusp.. The aortic valve is abnormal. Aortic valve regurgitation is trivial. No aortic stenosis is present.  7. The pulmonic valve was abnormal.  8. There is Moderate (Grade III) plaque involving the descending aorta.  9. Agitated saline contrast bubble study was negative, with no evidence of any interatrial shunt. FINDINGS  Left Ventricle: Left ventricular ejection fraction, by estimation, is 60 to 65%. The left ventricle has normal function. The left ventricular internal cavity size was normal in size. There is moderate concentric left ventricular hypertrophy. Right Ventricle: The right ventricular size is normal. Right ventricular systolic function is normal. Left Atrium: Left atrial size was normal in size. No left atrial/left atrial appendage thrombus was detected. The LAA emptying velocity was 77 cm/s. Right Atrium: Right atrial size was normal in size. Pericardium: Trivial pericardial effusion is present. Mitral Valve: The mitral valve is normal in structure. Trivial mitral valve regurgitation. No evidence of mitral valve stenosis. Tricuspid Valve: The tricuspid valve is normal in structure. Tricuspid valve regurgitation is trivial. No evidence of tricuspid stenosis. Aortic Valve: Mobile echodensity present right coronary cusp; nodular density present left coronary cusp. The aortic valve is abnormal. Aortic valve regurgitation is trivial. No aortic stenosis is present. Pulmonic Valve: The pulmonic valve was abnormal. Pulmonic valve regurgitation is trivial. No evidence of pulmonic stenosis. Aorta: The aortic root and ascending aorta are structurally normal, with no evidence of dilitation. There is moderate (Grade III) plaque involving the descending aorta. IAS/Shunts: No atrial level shunt detected by color flow Doppler. Agitated saline contrast was given intravenously to evaluate for intracardiac shunting. Agitated saline  contrast bubble study was negative, with no evidence of any interatrial shunt. Additional Comments: Nodular density noted on left coronary cusp (possible nodular calcification); mobile density on right coronary cusp (vegetation vs lambl's excrescenc; less likely fibroelastoma). Spectral Doppler performed.  AORTA Ao Asc diam: 2.80 cm Olga Millers MD Electronically signed by Olga Millers MD Signature Date/Time: 10/14/2022/3:02:34 PM    Final    EP STUDY  Result Date: 10/14/2022 See surgical note for result.  ECHOCARDIOGRAM COMPLETE  Result Date: 10/13/2022    ECHOCARDIOGRAM REPORT   Patient Name:   Sarah [name] Date of Exam: 10/13/2022 Medical Rec #:  [id number]            Height:       60.0 in Accession #:    [id number]           Weight:       112.4 lb Date of Birth:  June 12, 1945            BSA:          1.461 m Patient Age:    77 years             BP:           108/80 mmHg Patient Gender: F  Physician Discharge Summary  Sarah [name] ZOX:[id number] DOB: 26-Dec-1945 DOA: 10/11/2022  PCP: [place], [name], MD  Admit date: 10/11/2022 Discharge date: 10/16/2022  Time spent: 40 minutes  Recommendations for Outpatient Follow-up:  Follow outpatient CBC/CMP  Follow neurology outpatient Follow with cards outpatient  Follow lambl's excresence with cards outpatient  Needs more aggressive management of diabetes, A1c 12.2  Discharge Diagnoses:  Principal Problem:   Stroke-like symptoms Active Problems:   Generalized weakness   Stroke (cerebrum) (HCC)   Acute ischemic stroke [facility])   Discharge Condition: stable  Diet recommendation: heart healthy  Filed Weights   10/12/22 0046  Weight: 51 kg    History of present illness:   77 year old female with history of prior CVA with residual left-sided hemiparesis, hypertension, hyperlipidemia, diabetes mellitus type 2, CAD who has loop recorder in place.  [facility] with 2-day history of right leg weakness and falls.  She is on Plavix due to history of prior stroke.  CT head was negative, MRI of the brain showed scattered small acute infarcts along the posterior left frontal cortex/white matter in the left occipital cortex and left lower cerebellum.  Neurology was consulted.  TEE showed vegetation vs lambl's excrescence.  Plan is for DAPT with Aspirin and brilinta x1 month followed by aspirin and plavix.   See below for additional details   Hospital Course:  Assessment and Plan:  Acute multifocal ischemic stroke -MRI brain with small acute infarcts scattered in the L cerebral hemisphere and L cerebellum -CTA head/neck without emergent finding, atherosclerosis with moderate to advanced bilateral MCA stenosis, worse on R where there is chronic inferior division infarction - neurology c/s, appreciate recs -> ASA and brilinta x1 month, then aspirin and plavix (if stroke while on DAPT need to consider  anticoagulation), no afib on loop recorder - echo with hyperechoic mass on L coronary cusp measuring 4x7 mm (ddx includes healed vegetation vs atypical myoma vs papillary fibroelastoma vs degerenative change related nodule)  - TEE with mobile density on R coronary cusp (vegetation vs lambl's excrescence) (as below) -Continue rosuvastatin, Zetia -PT/OT/SLP   Mobile Density on R Coronary Cusp - follow blood cultures (Ngx2), lower suspicion for infection - no white count or fevers -  cards recs -> recommending indefinite DAPT after 1 month of aspirin and brilinta (if recurrent CVA while on DAPT, need to consider anticoagulation instead) - follow with outpatient cards   Hypertension -amlodipine, losartan   Hyperlipidemia -Continue statin   Diabetes mellitus type 2 -Continue home diabetes regimen -A1c 12.2, uncontrolled, follow closely outpatient    CAD -Continue statin, antiplatelets as above     Procedures: TEE IMPRESSIONS     1. Nodular density noted on left coronary cusp (possible nodular  calcification); mobile density on right coronary cusp (vegetation vs  lambl's excrescenc; less likely fibroelastoma).   2. Left ventricular ejection fraction, by estimation, is 60 to 65%. The  left ventricle has normal function. There is moderate concentric left  ventricular hypertrophy.   3. Right ventricular systolic function is normal. The right ventricular  size is normal.   4. No left atrial/left atrial appendage thrombus was detected. The LAA  emptying velocity was 77 cm/s.   5. The mitral valve is normal in structure. Trivial mitral valve  regurgitation. No evidence of mitral stenosis.   6. Mobile echodensity present right coronary cusp; nodular density  present left coronary cusp.. The aortic valve is abnormal. Aortic valve  regurgitation is trivial. No  lambl's excrescenc; less likely fibroelastoma).  2. Left ventricular ejection fraction, by estimation, is 60 to 65%. The left ventricle has normal function. There is moderate concentric left ventricular hypertrophy.  3. Right ventricular systolic function is normal. The right ventricular size is normal.  4. No left atrial/left atrial appendage thrombus was detected. The LAA emptying velocity was 77 cm/s.  5. The mitral valve is normal in structure. Trivial mitral valve regurgitation. No evidence of mitral stenosis.   6. Mobile echodensity present right coronary cusp; nodular density present left coronary cusp.. The aortic valve is abnormal. Aortic valve regurgitation is trivial. No aortic stenosis is present.  7. The pulmonic valve was abnormal.  8. There is Moderate (Grade III) plaque involving the descending aorta.  9. Agitated saline contrast bubble study was negative, with no evidence of any interatrial shunt. FINDINGS  Left Ventricle: Left ventricular ejection fraction, by estimation, is 60 to 65%. The left ventricle has normal function. The left ventricular internal cavity size was normal in size. There is moderate concentric left ventricular hypertrophy. Right Ventricle: The right ventricular size is normal. Right ventricular systolic function is normal. Left Atrium: Left atrial size was normal in size. No left atrial/left atrial appendage thrombus was detected. The LAA emptying velocity was 77 cm/s. Right Atrium: Right atrial size was normal in size. Pericardium: Trivial pericardial effusion is present. Mitral Valve: The mitral valve is normal in structure. Trivial mitral valve regurgitation. No evidence of mitral valve stenosis. Tricuspid Valve: The tricuspid valve is normal in structure. Tricuspid valve regurgitation is trivial. No evidence of tricuspid stenosis. Aortic Valve: Mobile echodensity present right coronary cusp; nodular density present left coronary cusp. The aortic valve is abnormal. Aortic valve regurgitation is trivial. No aortic stenosis is present. Pulmonic Valve: The pulmonic valve was abnormal. Pulmonic valve regurgitation is trivial. No evidence of pulmonic stenosis. Aorta: The aortic root and ascending aorta are structurally normal, with no evidence of dilitation. There is moderate (Grade III) plaque involving the descending aorta. IAS/Shunts: No atrial level shunt detected by color flow Doppler. Agitated saline contrast was given intravenously to evaluate for intracardiac shunting. Agitated saline  contrast bubble study was negative, with no evidence of any interatrial shunt. Additional Comments: Nodular density noted on left coronary cusp (possible nodular calcification); mobile density on right coronary cusp (vegetation vs lambl's excrescenc; less likely fibroelastoma). Spectral Doppler performed.  AORTA Ao Asc diam: 2.80 cm Olga Millers MD Electronically signed by Olga Millers MD Signature Date/Time: 10/14/2022/3:02:34 PM    Final    EP STUDY  Result Date: 10/14/2022 See surgical note for result.  ECHOCARDIOGRAM COMPLETE  Result Date: 10/13/2022    ECHOCARDIOGRAM REPORT   Patient Name:   Sarah [name] Date of Exam: 10/13/2022 Medical Rec #:  [id number]            Height:       60.0 in Accession #:    [id number]           Weight:       112.4 lb Date of Birth:  June 12, 1945            BSA:          1.461 m Patient Age:    77 years             BP:           108/80 mmHg Patient Gender: F  the left cerebral hemisphere and left cerebellum. 2. Extensive chronic small vessel disease and right cerebral cortex infarcts. Electronically Signed   By: Tiburcio Pea M.D.   On: 10/12/2022 05:24   CT HEAD WO CONTRAST  Result Date: 10/11/2022 CLINICAL DATA:  Multiple recent falls for, initial encounter EXAM: CT HEAD WITHOUT CONTRAST TECHNIQUE: Contiguous axial images were obtained from the base of the skull through the vertex without intravenous contrast. RADIATION DOSE REDUCTION: This exam was performed according to the departmental dose-optimization program which includes automated exposure control, adjustment of the mA and/or kV according to patient size and/or use of iterative reconstruction technique. COMPARISON:  10/23/2021 FINDINGS: Brain: No evidence of acute infarction, hemorrhage, hydrocephalus, extra-axial collection or mass lesion/mass effect.  Changes of prior right temporoparietal infarct are seen and stable. Mild atrophic changes and chronic white matter ischemic changes are seen. Vascular: No hyperdense vessel or unexpected calcification. Skull: Normal. Negative for fracture or focal lesion. Sinuses/Orbits: No acute finding. Other: None. IMPRESSION: Chronic atrophic and ischemic changes.  No acute abnormality noted. Electronically Signed   By: Alcide Clever M.D.   On: 10/11/2022 21:45   CUP PACEART REMOTE DEVICE CHECK  Result Date: 10/11/2022 ILR summary report received. Battery status OK. Normal device function. No new symptom, tachy, brady, or pause episodes. No new AF episodes. Monthly summary reports and ROV/PRN LA, CVRS  Nocturnal polysomnography  Result Date: 10/03/2022 Huston Foley, MD     10/11/2022  6:07 PM Physician Interpretation:  [facility] POLYSOMNOGRAPHY  INTERPRETATION REPORT STUDY DATE:  10/03/2022  PATIENT NAME:  Sarah [name]        DATE OF BIRTH:  10-13-1945 PATIENT ID:  [id number]    TYPE OF STUDY:  PSG READING PHYSICIAN: Huston Foley, MD, PhD   SCORING TECHNICIAN: Margaretann Loveless, RPSGT Referred by: Ihor Austin, NP ? History and Indication for Testing: 77 year old female with an underlying medical history of multiple strokes, type 2 diabetes, hyperlipidemia, osteoporosis, hypertension, glaucoma, carpal tunnel syndrome, history of pneumonia, and colonic polyps, who reports snoring and excessive daytime somnolence, as well as nocturia and morning headaches. Her Epworth sleepiness score is 14 out of 24, fatigue severity score is 39 out of 63.  Height: 60 in Weight: 113 lb (BMI 22) Neck Size: 13 in  MEDICATIONS: Norvasc, Lipitor, Vitamin D3, Plavix, Zetia, Lasix, Apresoline, Toujeo Solostar, Xalatan, Victoza, Cozaar, Nitrostat, Zoloft, Topamax  TECHNICAL DESCRIPTION: Leighann Amadon registered sleep technologist was in attendance for the duration of the recording.  Data collection, scoring, video  monitoring, and reporting were performed in compliance with the AASM Manual for the Scoring of Sleep and Associated Events; (Hypopnea is scored based on the criteria listed in Section VIII D. 1b in the AASM Manual V2.6 using Yuval Rubens 4% oxygen desaturation rule or Hypopnea is scored based on the criteria listed in Section VIII D. 1a in the AASM Manual V2.6 using 3% oxygen desaturation and /or arousal rule). SLEEP CONTINUITY AND SLEEP ARCHITECTURE:  Lights-out was at 21:33: and lights-on at  05:31:, with Dearis Danis total recording time of 7 hours, 58 min. Total sleep time ( TST) was 281.0 minutes with Jaileen Janelle decreased sleep efficiency at 58.8%. There was  10.0% REM sleep. BODY POSITION:  TST was divided  between the following sleep positions: 71.0% supine;  29.0% lateral;  0% prone. Duration of total sleep and percent of total sleep in their respective position is as follows: supine 199 minutes (71%), non-supine 82 minutes (29%); right 00 minutes (0%), left 81 minutes (  lambl's excrescenc; less likely fibroelastoma).  2. Left ventricular ejection fraction, by estimation, is 60 to 65%. The left ventricle has normal function. There is moderate concentric left ventricular hypertrophy.  3. Right ventricular systolic function is normal. The right ventricular size is normal.  4. No left atrial/left atrial appendage thrombus was detected. The LAA emptying velocity was 77 cm/s.  5. The mitral valve is normal in structure. Trivial mitral valve regurgitation. No evidence of mitral stenosis.   6. Mobile echodensity present right coronary cusp; nodular density present left coronary cusp.. The aortic valve is abnormal. Aortic valve regurgitation is trivial. No aortic stenosis is present.  7. The pulmonic valve was abnormal.  8. There is Moderate (Grade III) plaque involving the descending aorta.  9. Agitated saline contrast bubble study was negative, with no evidence of any interatrial shunt. FINDINGS  Left Ventricle: Left ventricular ejection fraction, by estimation, is 60 to 65%. The left ventricle has normal function. The left ventricular internal cavity size was normal in size. There is moderate concentric left ventricular hypertrophy. Right Ventricle: The right ventricular size is normal. Right ventricular systolic function is normal. Left Atrium: Left atrial size was normal in size. No left atrial/left atrial appendage thrombus was detected. The LAA emptying velocity was 77 cm/s. Right Atrium: Right atrial size was normal in size. Pericardium: Trivial pericardial effusion is present. Mitral Valve: The mitral valve is normal in structure. Trivial mitral valve regurgitation. No evidence of mitral valve stenosis. Tricuspid Valve: The tricuspid valve is normal in structure. Tricuspid valve regurgitation is trivial. No evidence of tricuspid stenosis. Aortic Valve: Mobile echodensity present right coronary cusp; nodular density present left coronary cusp. The aortic valve is abnormal. Aortic valve regurgitation is trivial. No aortic stenosis is present. Pulmonic Valve: The pulmonic valve was abnormal. Pulmonic valve regurgitation is trivial. No evidence of pulmonic stenosis. Aorta: The aortic root and ascending aorta are structurally normal, with no evidence of dilitation. There is moderate (Grade III) plaque involving the descending aorta. IAS/Shunts: No atrial level shunt detected by color flow Doppler. Agitated saline contrast was given intravenously to evaluate for intracardiac shunting. Agitated saline  contrast bubble study was negative, with no evidence of any interatrial shunt. Additional Comments: Nodular density noted on left coronary cusp (possible nodular calcification); mobile density on right coronary cusp (vegetation vs lambl's excrescenc; less likely fibroelastoma). Spectral Doppler performed.  AORTA Ao Asc diam: 2.80 cm Olga Millers MD Electronically signed by Olga Millers MD Signature Date/Time: 10/14/2022/3:02:34 PM    Final    EP STUDY  Result Date: 10/14/2022 See surgical note for result.  ECHOCARDIOGRAM COMPLETE  Result Date: 10/13/2022    ECHOCARDIOGRAM REPORT   Patient Name:   Sarah [name] Date of Exam: 10/13/2022 Medical Rec #:  [id number]            Height:       60.0 in Accession #:    [id number]           Weight:       112.4 lb Date of Birth:  June 12, 1945            BSA:          1.461 m Patient Age:    77 years             BP:           108/80 mmHg Patient Gender: F  the left cerebral hemisphere and left cerebellum. 2. Extensive chronic small vessel disease and right cerebral cortex infarcts. Electronically Signed   By: Tiburcio Pea M.D.   On: 10/12/2022 05:24   CT HEAD WO CONTRAST  Result Date: 10/11/2022 CLINICAL DATA:  Multiple recent falls for, initial encounter EXAM: CT HEAD WITHOUT CONTRAST TECHNIQUE: Contiguous axial images were obtained from the base of the skull through the vertex without intravenous contrast. RADIATION DOSE REDUCTION: This exam was performed according to the departmental dose-optimization program which includes automated exposure control, adjustment of the mA and/or kV according to patient size and/or use of iterative reconstruction technique. COMPARISON:  10/23/2021 FINDINGS: Brain: No evidence of acute infarction, hemorrhage, hydrocephalus, extra-axial collection or mass lesion/mass effect.  Changes of prior right temporoparietal infarct are seen and stable. Mild atrophic changes and chronic white matter ischemic changes are seen. Vascular: No hyperdense vessel or unexpected calcification. Skull: Normal. Negative for fracture or focal lesion. Sinuses/Orbits: No acute finding. Other: None. IMPRESSION: Chronic atrophic and ischemic changes.  No acute abnormality noted. Electronically Signed   By: Alcide Clever M.D.   On: 10/11/2022 21:45   CUP PACEART REMOTE DEVICE CHECK  Result Date: 10/11/2022 ILR summary report received. Battery status OK. Normal device function. No new symptom, tachy, brady, or pause episodes. No new AF episodes. Monthly summary reports and ROV/PRN LA, CVRS  Nocturnal polysomnography  Result Date: 10/03/2022 Huston Foley, MD     10/11/2022  6:07 PM Physician Interpretation:  [facility] POLYSOMNOGRAPHY  INTERPRETATION REPORT STUDY DATE:  10/03/2022  PATIENT NAME:  Sarah [name]        DATE OF BIRTH:  10-13-1945 PATIENT ID:  [id number]    TYPE OF STUDY:  PSG READING PHYSICIAN: Huston Foley, MD, PhD   SCORING TECHNICIAN: Margaretann Loveless, RPSGT Referred by: Ihor Austin, NP ? History and Indication for Testing: 77 year old female with an underlying medical history of multiple strokes, type 2 diabetes, hyperlipidemia, osteoporosis, hypertension, glaucoma, carpal tunnel syndrome, history of pneumonia, and colonic polyps, who reports snoring and excessive daytime somnolence, as well as nocturia and morning headaches. Her Epworth sleepiness score is 14 out of 24, fatigue severity score is 39 out of 63.  Height: 60 in Weight: 113 lb (BMI 22) Neck Size: 13 in  MEDICATIONS: Norvasc, Lipitor, Vitamin D3, Plavix, Zetia, Lasix, Apresoline, Toujeo Solostar, Xalatan, Victoza, Cozaar, Nitrostat, Zoloft, Topamax  TECHNICAL DESCRIPTION: Leighann Amadon registered sleep technologist was in attendance for the duration of the recording.  Data collection, scoring, video  monitoring, and reporting were performed in compliance with the AASM Manual for the Scoring of Sleep and Associated Events; (Hypopnea is scored based on the criteria listed in Section VIII D. 1b in the AASM Manual V2.6 using Yuval Rubens 4% oxygen desaturation rule or Hypopnea is scored based on the criteria listed in Section VIII D. 1a in the AASM Manual V2.6 using 3% oxygen desaturation and /or arousal rule). SLEEP CONTINUITY AND SLEEP ARCHITECTURE:  Lights-out was at 21:33: and lights-on at  05:31:, with Dearis Danis total recording time of 7 hours, 58 min. Total sleep time ( TST) was 281.0 minutes with Jaileen Janelle decreased sleep efficiency at 58.8%. There was  10.0% REM sleep. BODY POSITION:  TST was divided  between the following sleep positions: 71.0% supine;  29.0% lateral;  0% prone. Duration of total sleep and percent of total sleep in their respective position is as follows: supine 199 minutes (71%), non-supine 82 minutes (29%); right 00 minutes (0%), left 81 minutes (  Physician Discharge Summary  Sarah [name] ZOX:[id number] DOB: 26-Dec-1945 DOA: 10/11/2022  PCP: [place], [name], MD  Admit date: 10/11/2022 Discharge date: 10/16/2022  Time spent: 40 minutes  Recommendations for Outpatient Follow-up:  Follow outpatient CBC/CMP  Follow neurology outpatient Follow with cards outpatient  Follow lambl's excresence with cards outpatient  Needs more aggressive management of diabetes, A1c 12.2  Discharge Diagnoses:  Principal Problem:   Stroke-like symptoms Active Problems:   Generalized weakness   Stroke (cerebrum) (HCC)   Acute ischemic stroke [facility])   Discharge Condition: stable  Diet recommendation: heart healthy  Filed Weights   10/12/22 0046  Weight: 51 kg    History of present illness:   77 year old female with history of prior CVA with residual left-sided hemiparesis, hypertension, hyperlipidemia, diabetes mellitus type 2, CAD who has loop recorder in place.  [facility] with 2-day history of right leg weakness and falls.  She is on Plavix due to history of prior stroke.  CT head was negative, MRI of the brain showed scattered small acute infarcts along the posterior left frontal cortex/white matter in the left occipital cortex and left lower cerebellum.  Neurology was consulted.  TEE showed vegetation vs lambl's excrescence.  Plan is for DAPT with Aspirin and brilinta x1 month followed by aspirin and plavix.   See below for additional details   Hospital Course:  Assessment and Plan:  Acute multifocal ischemic stroke -MRI brain with small acute infarcts scattered in the L cerebral hemisphere and L cerebellum -CTA head/neck without emergent finding, atherosclerosis with moderate to advanced bilateral MCA stenosis, worse on R where there is chronic inferior division infarction - neurology c/s, appreciate recs -> ASA and brilinta x1 month, then aspirin and plavix (if stroke while on DAPT need to consider  anticoagulation), no afib on loop recorder - echo with hyperechoic mass on L coronary cusp measuring 4x7 mm (ddx includes healed vegetation vs atypical myoma vs papillary fibroelastoma vs degerenative change related nodule)  - TEE with mobile density on R coronary cusp (vegetation vs lambl's excrescence) (as below) -Continue rosuvastatin, Zetia -PT/OT/SLP   Mobile Density on R Coronary Cusp - follow blood cultures (Ngx2), lower suspicion for infection - no white count or fevers -  cards recs -> recommending indefinite DAPT after 1 month of aspirin and brilinta (if recurrent CVA while on DAPT, need to consider anticoagulation instead) - follow with outpatient cards   Hypertension -amlodipine, losartan   Hyperlipidemia -Continue statin   Diabetes mellitus type 2 -Continue home diabetes regimen -A1c 12.2, uncontrolled, follow closely outpatient    CAD -Continue statin, antiplatelets as above     Procedures: TEE IMPRESSIONS     1. Nodular density noted on left coronary cusp (possible nodular  calcification); mobile density on right coronary cusp (vegetation vs  lambl's excrescenc; less likely fibroelastoma).   2. Left ventricular ejection fraction, by estimation, is 60 to 65%. The  left ventricle has normal function. There is moderate concentric left  ventricular hypertrophy.   3. Right ventricular systolic function is normal. The right ventricular  size is normal.   4. No left atrial/left atrial appendage thrombus was detected. The LAA  emptying velocity was 77 cm/s.   5. The mitral valve is normal in structure. Trivial mitral valve  regurgitation. No evidence of mitral stenosis.   6. Mobile echodensity present right coronary cusp; nodular density  present left coronary cusp.. The aortic valve is abnormal. Aortic valve  regurgitation is trivial. No  Physician Discharge Summary  Sarah [name] ZOX:[id number] DOB: 26-Dec-1945 DOA: 10/11/2022  PCP: [place], [name], MD  Admit date: 10/11/2022 Discharge date: 10/16/2022  Time spent: 40 minutes  Recommendations for Outpatient Follow-up:  Follow outpatient CBC/CMP  Follow neurology outpatient Follow with cards outpatient  Follow lambl's excresence with cards outpatient  Needs more aggressive management of diabetes, A1c 12.2  Discharge Diagnoses:  Principal Problem:   Stroke-like symptoms Active Problems:   Generalized weakness   Stroke (cerebrum) (HCC)   Acute ischemic stroke [facility])   Discharge Condition: stable  Diet recommendation: heart healthy  Filed Weights   10/12/22 0046  Weight: 51 kg    History of present illness:   77 year old female with history of prior CVA with residual left-sided hemiparesis, hypertension, hyperlipidemia, diabetes mellitus type 2, CAD who has loop recorder in place.  [facility]ially [facility] with 2-day history of right leg weakness and falls.  She is on Plavix due to history of prior stroke.  CT head was negative, MRI of the brain showed scattered small acute infarcts along the posterior left frontal cortex/white matter in the left occipital cortex and left lower cerebellum.  Neurology was consulted.  TEE showed vegetation vs lambl's excrescence.  Plan is for DAPT with Aspirin and brilinta x1 month followed by aspirin and plavix.   See below for additional details   Hospital Course:  Assessment and Plan:  Acute multifocal ischemic stroke -MRI brain with small acute infarcts scattered in the L cerebral hemisphere and L cerebellum -CTA head/neck without emergent finding, atherosclerosis with moderate to advanced bilateral MCA stenosis, worse on R where there is chronic inferior division infarction - neurology c/s, appreciate recs -> ASA and brilinta x1 month, then aspirin and plavix (if stroke while on DAPT need to consider  anticoagulation), no afib on loop recorder - echo with hyperechoic mass on L coronary cusp measuring 4x7 mm (ddx includes healed vegetation vs atypical myoma vs papillary fibroelastoma vs degerenative change related nodule)  - TEE with mobile density on R coronary cusp (vegetation vs lambl's excrescence) (as below) -Continue rosuvastatin, Zetia -PT/OT/SLP   Mobile Density on R Coronary Cusp - follow blood cultures (Ngx2), lower suspicion for infection - no white count or fevers -  cards recs -> recommending indefinite DAPT after 1 month of aspirin and brilinta (if recurrent CVA while on DAPT, need to consider anticoagulation instead) - follow with outpatient cards   Hypertension -amlodipine, losartan   Hyperlipidemia -Continue statin   Diabetes mellitus type 2 -Continue home diabetes regimen -A1c 12.2, uncontrolled, follow closely outpatient    CAD -Continue statin, antiplatelets as above     Procedures: TEE IMPRESSIONS     1. Nodular density noted on left coronary cusp (possible nodular  calcification); mobile density on right coronary cusp (vegetation vs  lambl's excrescenc; less likely fibroelastoma).   2. Left ventricular ejection fraction, by estimation, is 60 to 65%. The  left ventricle has normal function. There is moderate concentric left  ventricular hypertrophy.   3. Right ventricular systolic function is normal. The right ventricular  size is normal.   4. No left atrial/left atrial appendage thrombus was detected. The LAA  emptying velocity was 77 cm/s.   5. The mitral valve is normal in structure. Trivial mitral valve  regurgitation. No evidence of mitral stenosis.   6. Mobile echodensity present right coronary cusp; nodular density  present left coronary cusp.. The aortic valve is abnormal. Aortic valve  regurgitation is trivial. No  disorder or medical problem. 3. The patient should be cautioned not to drive, work at heights, or operate dangerous or heavy equipment when tired or sleepy. Review and reiteration of good sleep hygiene measures should be pursued with any patient. 4. The patient will be advised to follow up with the referring provider, who will be notified of the test results.   I certify that I have reviewed the entire raw data recording prior to the issuance of this report in accordance with the Standards of Accreditation of the American Academy of Sleep Medicine (AASM). Huston Foley, MD, PhD Medical Director, [facility]) Diplomat, ABPN (Neurology and Sleep)    Technical Report: General Information Name: Sarah [name], Sarah [name] BMI: 22.07 Physician: Huston Foley, MD ID: [id number] Height: 60.0 in Technician: Margaretann Loveless, RPSGT Sex: Female Weight: 113.0 lb Record: [id number] Age: 76 [Jun 23, 1945] Date: 10/03/2022   Medical & Medication History   77 year old female with an underlying medical history of multiple strokes, including stroke of the left cerebellum and left parietal lobe in October 2023, and prior right MCA territory and right cerebellar infarcts, type 2 diabetes, hyperlipidemia,  osteoporosis, hypertension, glaucoma, carpal tunnel syndrome, history of pneumonia, and colonic polyps, who reports snoring and excessive daytime somnolence, as well as nocturia and morning headaches. Norvasc, Lipitor, Vitamin D3, Plavix, Zetia, Lasix, Apresoline, Toujeo Solostar, Xalatan, Victoza, Cozaar, Nitrostat, Zoloft, Topamax  Sleep Disorder    Comments  The patient came into the lab for Chevy Virgo PSG. The patient took Zoloft prior to start of study. The patient had four restroom breaks. No obvious cardiac arrhythmia. Per the patient she does have Amariyana Heacox loop recorder on. Mild to moderate snoring. Respiratory events scored with Ron Beske 4% desat. Respiratory events in REM. Slept lateral and supine. The had periods of WASO. The patient had fragmented sleep. During some periods of being awake the patient would get on phone. Some PLM's. AHI was 3.3 after 2 hrs of TST. The patient did not reach 2 hrs of TST until 2:35am. Sleep efficiency was 58.79%. The patient's glucose monitor kept beeping due to high glucose levels. Tech asked the patient want does she do for high glucose levels. The patient said she doesn't do anything for it therefore, glucose monitor keep beeping throughout the night. Earlier in the study the tech had to go into the restroom to help the patient untangle the wires. All sleep stages witnessed.   Lights out: 09:33:15 PM Lights on: 05:31:41 AM Time Total Supine Side Prone Upright Recording (TRT) 7h 58.43m 6h 14.55m 1h 43.24m 0h 0.54m 0h 0.71m Sleep (TST) 4h 41.73m 3h 19.42m 1h 21.53m 0h 0.58m 0h 0.64m Latency N1 N2 N3 REM Onset Per. Slp. Eff. Actual 0h 0.49m 0h 4.31m 2h 25.17m 6h 33.61m 0h 57.47m 2h 47.44m 58.79% Stg Dur Wake N1 N2 N3 REM Total 197.0 17.0 197.5 38.5 28.0 Supine 175.0 11.0 136.5 24.0 28.0 Side 22.0 6.0 61.0 14.5 0.0 Prone 0.0 0.0 0.0 0.0 0.0 Upright 0.0 0.0 0.0 0.0 0.0  Stg % Wake N1 N2 N3 REM Total 41.2 6.0 70.3 13.7 10.0 Supine 36.6 3.9 48.6 8.5 10.0 Side 4.6 2.1 21.7 5.2 0.0 Prone 0.0 0.0 0.0 0.0 0.0 Upright  0.0 0.0 0.0 0.0 0.0  Apnea Summary Sub Supine Side Prone Upright Total 0 Total 0 0 0 0 0   REM 0 0 0 0 0   NREM 0 0 0 0 0 Obs 0 REM 0 0 0 0 0   NREM 0 0 0 0 0  lambl's excrescenc; less likely fibroelastoma).  2. Left ventricular ejection fraction, by estimation, is 60 to 65%. The left ventricle has normal function. There is moderate concentric left ventricular hypertrophy.  3. Right ventricular systolic function is normal. The right ventricular size is normal.  4. No left atrial/left atrial appendage thrombus was detected. The LAA emptying velocity was 77 cm/s.  5. The mitral valve is normal in structure. Trivial mitral valve regurgitation. No evidence of mitral stenosis.   6. Mobile echodensity present right coronary cusp; nodular density present left coronary cusp.. The aortic valve is abnormal. Aortic valve regurgitation is trivial. No aortic stenosis is present.  7. The pulmonic valve was abnormal.  8. There is Moderate (Grade III) plaque involving the descending aorta.  9. Agitated saline contrast bubble study was negative, with no evidence of any interatrial shunt. FINDINGS  Left Ventricle: Left ventricular ejection fraction, by estimation, is 60 to 65%. The left ventricle has normal function. The left ventricular internal cavity size was normal in size. There is moderate concentric left ventricular hypertrophy. Right Ventricle: The right ventricular size is normal. Right ventricular systolic function is normal. Left Atrium: Left atrial size was normal in size. No left atrial/left atrial appendage thrombus was detected. The LAA emptying velocity was 77 cm/s. Right Atrium: Right atrial size was normal in size. Pericardium: Trivial pericardial effusion is present. Mitral Valve: The mitral valve is normal in structure. Trivial mitral valve regurgitation. No evidence of mitral valve stenosis. Tricuspid Valve: The tricuspid valve is normal in structure. Tricuspid valve regurgitation is trivial. No evidence of tricuspid stenosis. Aortic Valve: Mobile echodensity present right coronary cusp; nodular density present left coronary cusp. The aortic valve is abnormal. Aortic valve regurgitation is trivial. No aortic stenosis is present. Pulmonic Valve: The pulmonic valve was abnormal. Pulmonic valve regurgitation is trivial. No evidence of pulmonic stenosis. Aorta: The aortic root and ascending aorta are structurally normal, with no evidence of dilitation. There is moderate (Grade III) plaque involving the descending aorta. IAS/Shunts: No atrial level shunt detected by color flow Doppler. Agitated saline contrast was given intravenously to evaluate for intracardiac shunting. Agitated saline  contrast bubble study was negative, with no evidence of any interatrial shunt. Additional Comments: Nodular density noted on left coronary cusp (possible nodular calcification); mobile density on right coronary cusp (vegetation vs lambl's excrescenc; less likely fibroelastoma). Spectral Doppler performed.  AORTA Ao Asc diam: 2.80 cm Olga Millers MD Electronically signed by Olga Millers MD Signature Date/Time: 10/14/2022/3:02:34 PM    Final    EP STUDY  Result Date: 10/14/2022 See surgical note for result.  ECHOCARDIOGRAM COMPLETE  Result Date: 10/13/2022    ECHOCARDIOGRAM REPORT   Patient Name:   Sarah [name] Date of Exam: 10/13/2022 Medical Rec #:  [id number]            Height:       60.0 in Accession #:    [id number]           Weight:       112.4 lb Date of Birth:  June 12, 1945            BSA:          1.461 m Patient Age:    77 years             BP:           108/80 mmHg Patient Gender: F  1 tablet (25 mg total) by mouth at bedtime.   Toujeo SoloStar 300 UNIT/ML Solostar Pen Generic drug: insulin glargine (1 Unit Dial) Inject 14 Units into the skin daily in the afternoon.       Allergies  Allergen Reactions   Penicillins Other (See Comments)    Does not know reaction    Follow-up Information     [facility] Follow up.   Specialty: Home Health Services Why: The home health agency will contact you for the first home visit. Contact information: [address] [place] [address] [phone]         Ihor Austin, NP. Schedule an appointment as soon as possible for Beuford Garcilazo visit in 1 month(s).   Specialty: Neurology Why: stroke clinic Contact information: [address] [place] [address] [phone]                  The results of significant diagnostics from this hospitalization (including imaging, microbiology, ancillary and laboratory) are listed below for reference.    Significant Diagnostic Studies: ECHO TEE  Result Date: 10/14/2022    TRANSESOPHOGEAL ECHO REPORT   Patient Name:   Sarah [name] Date of Exam: 10/14/2022 Medical Rec #:  [id number]            Height:       60.0 in Accession #:    [id number]           Weight:       112.4 lb Date of Birth:  07/21/1945            BSA:          1.461 m Patient Age:    77 years             BP:           176/70 mmHg  Patient Gender: F                    HR:           92 bpm. Exam Location:  Inpatient Procedure: Transesophageal Echo, 3D Echo, Cardiac Doppler and Color Doppler Indications:     I35.8 Other nonrheumatic aortic valve disorders. Stroke  History:         Patient has prior history of Echocardiogram examinations, most                  recent 10/13/2022. CHF, CAD, Abnormal ECG, Stroke,                  Signs/Symptoms:Dizziness/Lightheadedness; Risk                  Factors:Diabetes, Dyslipidemia and Former Smoker.  Sonographer:     Sheralyn Boatman RDCS Referring Phys:  [id number] BRIAN S CRENSHAW Diagnosing Phys: Olga Millers MD PROCEDURE: After discussion of the risks and benefits of Rhian Funari TEE, an informed consent was obtained from the patient. The transesophogeal probe was passed without difficulty through the esophogus of the patient. Imaged were obtained with the patient in Bernise Sylvain left lateral decubitus position. Sedation performed by different physician. The patient was monitored while under deep sedation. Anesthestetic sedation was provided intravenously by Anesthesiology: 120mg  of Propofol. The patient's vital signs; including heart rate, blood pressure, and oxygen saturation; remained stable throughout the procedure. The patient developed no complications during the procedure.  IMPRESSIONS  1. Nodular density noted on left coronary cusp (possible nodular calcification); mobile density on right coronary cusp (vegetation vs  lambl's excrescenc; less likely fibroelastoma).  2. Left ventricular ejection fraction, by estimation, is 60 to 65%. The left ventricle has normal function. There is moderate concentric left ventricular hypertrophy.  3. Right ventricular systolic function is normal. The right ventricular size is normal.  4. No left atrial/left atrial appendage thrombus was detected. The LAA emptying velocity was 77 cm/s.  5. The mitral valve is normal in structure. Trivial mitral valve regurgitation. No evidence of mitral stenosis.   6. Mobile echodensity present right coronary cusp; nodular density present left coronary cusp.. The aortic valve is abnormal. Aortic valve regurgitation is trivial. No aortic stenosis is present.  7. The pulmonic valve was abnormal.  8. There is Moderate (Grade III) plaque involving the descending aorta.  9. Agitated saline contrast bubble study was negative, with no evidence of any interatrial shunt. FINDINGS  Left Ventricle: Left ventricular ejection fraction, by estimation, is 60 to 65%. The left ventricle has normal function. The left ventricular internal cavity size was normal in size. There is moderate concentric left ventricular hypertrophy. Right Ventricle: The right ventricular size is normal. Right ventricular systolic function is normal. Left Atrium: Left atrial size was normal in size. No left atrial/left atrial appendage thrombus was detected. The LAA emptying velocity was 77 cm/s. Right Atrium: Right atrial size was normal in size. Pericardium: Trivial pericardial effusion is present. Mitral Valve: The mitral valve is normal in structure. Trivial mitral valve regurgitation. No evidence of mitral valve stenosis. Tricuspid Valve: The tricuspid valve is normal in structure. Tricuspid valve regurgitation is trivial. No evidence of tricuspid stenosis. Aortic Valve: Mobile echodensity present right coronary cusp; nodular density present left coronary cusp. The aortic valve is abnormal. Aortic valve regurgitation is trivial. No aortic stenosis is present. Pulmonic Valve: The pulmonic valve was abnormal. Pulmonic valve regurgitation is trivial. No evidence of pulmonic stenosis. Aorta: The aortic root and ascending aorta are structurally normal, with no evidence of dilitation. There is moderate (Grade III) plaque involving the descending aorta. IAS/Shunts: No atrial level shunt detected by color flow Doppler. Agitated saline contrast was given intravenously to evaluate for intracardiac shunting. Agitated saline  contrast bubble study was negative, with no evidence of any interatrial shunt. Additional Comments: Nodular density noted on left coronary cusp (possible nodular calcification); mobile density on right coronary cusp (vegetation vs lambl's excrescenc; less likely fibroelastoma). Spectral Doppler performed.  AORTA Ao Asc diam: 2.80 cm Olga Millers MD Electronically signed by Olga Millers MD Signature Date/Time: 10/14/2022/3:02:34 PM    Final    EP STUDY  Result Date: 10/14/2022 See surgical note for result.  ECHOCARDIOGRAM COMPLETE  Result Date: 10/13/2022    ECHOCARDIOGRAM REPORT   Patient Name:   Sarah [name] Date of Exam: 10/13/2022 Medical Rec #:  [id number]            Height:       60.0 in Accession #:    [id number]           Weight:       112.4 lb Date of Birth:  June 12, 1945            BSA:          1.461 m Patient Age:    77 years             BP:           108/80 mmHg Patient Gender: F  disorder or medical problem. 3. The patient should be cautioned not to drive, work at heights, or operate dangerous or heavy equipment when tired or sleepy. Review and reiteration of good sleep hygiene measures should be pursued with any patient. 4. The patient will be advised to follow up with the referring provider, who will be notified of the test results.   I certify that I have reviewed the entire raw data recording prior to the issuance of this report in accordance with the Standards of Accreditation of the American Academy of Sleep Medicine (AASM). Huston Foley, MD, PhD Medical Director, [facility]) Diplomat, ABPN (Neurology and Sleep)    Technical Report: General Information Name: Sarah [name], Sarah [name] BMI: 22.07 Physician: Huston Foley, MD ID: [id number] Height: 60.0 in Technician: Margaretann Loveless, RPSGT Sex: Female Weight: 113.0 lb Record: [id number] Age: 76 [Jun 23, 1945] Date: 10/03/2022   Medical & Medication History   77 year old female with an underlying medical history of multiple strokes, including stroke of the left cerebellum and left parietal lobe in October 2023, and prior right MCA territory and right cerebellar infarcts, type 2 diabetes, hyperlipidemia,  osteoporosis, hypertension, glaucoma, carpal tunnel syndrome, history of pneumonia, and colonic polyps, who reports snoring and excessive daytime somnolence, as well as nocturia and morning headaches. Norvasc, Lipitor, Vitamin D3, Plavix, Zetia, Lasix, Apresoline, Toujeo Solostar, Xalatan, Victoza, Cozaar, Nitrostat, Zoloft, Topamax  Sleep Disorder    Comments  The patient came into the lab for Chevy Virgo PSG. The patient took Zoloft prior to start of study. The patient had four restroom breaks. No obvious cardiac arrhythmia. Per the patient she does have Amariyana Heacox loop recorder on. Mild to moderate snoring. Respiratory events scored with Ron Beske 4% desat. Respiratory events in REM. Slept lateral and supine. The had periods of WASO. The patient had fragmented sleep. During some periods of being awake the patient would get on phone. Some PLM's. AHI was 3.3 after 2 hrs of TST. The patient did not reach 2 hrs of TST until 2:35am. Sleep efficiency was 58.79%. The patient's glucose monitor kept beeping due to high glucose levels. Tech asked the patient want does she do for high glucose levels. The patient said she doesn't do anything for it therefore, glucose monitor keep beeping throughout the night. Earlier in the study the tech had to go into the restroom to help the patient untangle the wires. All sleep stages witnessed.   Lights out: 09:33:15 PM Lights on: 05:31:41 AM Time Total Supine Side Prone Upright Recording (TRT) 7h 58.43m 6h 14.55m 1h 43.24m 0h 0.54m 0h 0.71m Sleep (TST) 4h 41.73m 3h 19.42m 1h 21.53m 0h 0.58m 0h 0.64m Latency N1 N2 N3 REM Onset Per. Slp. Eff. Actual 0h 0.49m 0h 4.31m 2h 25.17m 6h 33.61m 0h 57.47m 2h 47.44m 58.79% Stg Dur Wake N1 N2 N3 REM Total 197.0 17.0 197.5 38.5 28.0 Supine 175.0 11.0 136.5 24.0 28.0 Side 22.0 6.0 61.0 14.5 0.0 Prone 0.0 0.0 0.0 0.0 0.0 Upright 0.0 0.0 0.0 0.0 0.0  Stg % Wake N1 N2 N3 REM Total 41.2 6.0 70.3 13.7 10.0 Supine 36.6 3.9 48.6 8.5 10.0 Side 4.6 2.1 21.7 5.2 0.0 Prone 0.0 0.0 0.0 0.0 0.0 Upright  0.0 0.0 0.0 0.0 0.0  Apnea Summary Sub Supine Side Prone Upright Total 0 Total 0 0 0 0 0   REM 0 0 0 0 0   NREM 0 0 0 0 0 Obs 0 REM 0 0 0 0 0   NREM 0 0 0 0 0

## 2022-10-18 ENCOUNTER — Telehealth: Payer: Self-pay

## 2022-10-18 NOTE — Transitions of Care (Post Inpatient/ED Visit) (Signed)
10/18/2022  Name: Sarah [name] MRN: [id number] DOB: Mar 28, 1945  Today's TOC FU Call Status: Today's TOC FU Call Status:: Successful TOC FU Call Completed TOC FU Call Complete Date: 10/18/22 Patient's Name and Date of Birth confirmed.  Transition Care Management Follow-up Telephone Call Date of Discharge: 10/16/22 Discharge Facility: [facility]) Type of Discharge: Inpatient Admission Primary Inpatient Discharge Diagnosis:: "right sided weakness" Any questions or concerns?: Yes Patient Questions/Concerns:: Patient wanted to know "why she keeps having strokes" Patient Questions/Concerns Addressed: Other: (Discusse with pt modifiable and non-modifable risk factors and importance of manages cholesterol,diabetes and adherence to med mgmt)  Items Reviewed: Did you receive and understand the discharge instructions provided?: Yes Medications obtained,verified, and reconciled?: Yes (Medications Reviewed) Any new allergies since your discharge?: No Dietary orders reviewed?: Yes Type of Diet Ordered:: low salt/heart healthy/carb modified Do you have support at home?: Yes People in Home: sibling(s) Name of Support/Comfort Primary Source: [name](sis)  Medications Reviewed Today: Medications Reviewed Today     Reviewed by Charlyn Minerva, RN (Registered Nurse) on 10/18/22 at 1612  Med List Status: <None>   Medication Order Taking? Sig Documenting Provider Last Dose Status Informant  amLODipine (NORVASC) 5 MG tablet [id number] Yes Take 1 tablet (5 mg total) by mouth daily. [place], [name], MD Taking Active Family Member  aspirin EC 81 MG tablet [id number] Yes Take 1 tablet (81 mg total) by mouth daily. Swallow whole. Zigmund Daniel., MD Taking Active   cholecalciferol (VITAMIN D3) 25 MCG (1000 UNIT) tablet [id number] Yes Take 1,000 Units by mouth daily. [provider] Taking Active Family Member  clopidogrel (PLAVIX) 75 MG tablet [id number]  Take 1 tablet (75  mg total) by mouth daily. Start AFTER completing the brilinta prescription Zigmund Daniel., MD  Active   dapagliflozin propanediol (FARXIGA) 10 MG TABS tablet [id number] Yes Take 1 tablet (10 mg total) by mouth daily. Shamleffer, Konrad Dolores, MD Taking Active Family Member  ezetimibe (ZETIA) 10 MG tablet [id number] Yes Take 1 tablet (10 mg total) by mouth daily. [place], [name], MD Taking Active Family Member  furosemide (LASIX) 20 MG tablet [id number] Yes Take 1 tablet by mouth daily (only as needed) Dyann Kief, PA-C Taking Active Family Member           Med Note Kandis Cocking Alinda Dooms A   Wed Oct 13, 2022  5:04 PM)    insulin glargine, 1 Unit Dial, (TOUJEO SOLOSTAR) 300 UNIT/ML Solostar Pen [id number] Yes Inject 14 Units into the skin daily in the afternoon. Shamleffer, Konrad Dolores, MD Taking Active Family Member           Med Note Gemma Payor Oct 12, 2022  7:34 AM)    insulin lispro (HUMALOG KWIKPEN) 100 UNIT/ML KwikPen [id number] Yes Max daily 30 units Shamleffer, Konrad Dolores, MD Taking Active Family Member           Med Note Littie Deeds, [place] A   Fri Jun 25, 2022  1:19 PM) 4 units TID before meals  latanoprost (XALATAN) 0.005 % ophthalmic solution [id number] Yes Place 1 drop into both eyes at bedtime. [provider] Taking Active Family Member  losartan (COZAAR) 50 MG tablet [id number] Yes TAKE 1 TABLET BY MOUTH DAILY [place], [name], MD Taking Active Family Member  Magnesium 250 MG TABS [id number] Yes Take 250 mg by mouth daily. [provider] Taking Active Family Member  nitroGLYCERIN (NITROSTAT) 0.4 MG SL tablet [id number]  Place 1  tablet (0.4 mg total) under the tongue every 5 (five) minutes as needed for chest pain. one tab every 5 minutes up to 3 tablets total over 15 minutes. Christell Constant, MD  Active Family Member           Med Note Jola Schmidt   Tue Oct 12, 2022  7:33 AM)    ondansetron [facility]) 4 MG tablet [id number]  Yes Take 1 tablet (4 mg total) by mouth 2 (two) times daily as needed for nausea or vomiting. [place], [name], MD Taking Active Family Member           Med Note Jola Schmidt   Tue Oct 12, 2022  7:33 AM)    rosuvastatin (CRESTOR) 20 MG tablet [id number] Yes Take 1 tablet (20 mg total) by mouth daily. [place], [name], MD Taking Active Family Member  sertraline (ZOLOFT) 50 MG tablet [id number] Yes TAKE 1 TABLET(50 MG) BY MOUTH DAILY  Patient taking differently: Take 50 mg by mouth daily.   [place], [name], MD Taking Active Family Member  ticagrelor [facility]) 90 MG TABS tablet [id number] Yes Take 1 tablet (90 mg total) by mouth 2 (two) times daily. Zigmund Daniel., MD Taking Active   topiramate (TOPAMAX) 25 MG tablet [id number] Yes Take 1 tablet (25 mg total) by mouth at bedtime. Ihor Austin, NP Taking Active Family Member            Home Care and Equipment/Supplies: Were Home Health Services Ordered?: Yes Name of Home Health Agency:: [facility] Has Agency set up a time to come to your home?: No (Pt aware to call agency by tomorrow if she has not heard from them) Any new equipment or medical supplies ordered?: No  Functional Questionnaire: Do you need assistance with bathing/showering or dressing?: No Do you need assistance with meal preparation?: No Do you need assistance with eating?: No Do you have difficulty maintaining continence: No Do you need assistance with getting out of bed/getting out of a chair/moving?: No Do you have difficulty managing or taking your medications?: No  Follow up appointments reviewed: PCP Follow-up appointment confirmed?: Yes Date of PCP follow-up appointment?: 10/22/22 Follow-up Provider: Dr. [place] Specialist [facility] Follow-up appointment confirmed?: Yes Date of Specialist follow-up appointment?: 10/25/22 Follow-Up Specialty Provider:: Dr. Lonzo Cloud, [facility]-11/17/22 Do you need transportation to your follow-up appointment?: No (pt confirms she  has family to take her to appts) Do you understand care options if your condition(s) worsen?: Yes-patient verbalized understanding  SDOH Interventions Today    Flowsheet Row Most Recent Value  SDOH Interventions   Transportation Interventions Intervention Not Indicated         Goals Addressed             This Visit's Progress    TOC Care Plan       Current Barriers:  Chronic Disease Management support and education needs related to DMII and Stroke   RNCM Clinical Goal(s):  Patient will work with the Care Management team over the next 30 days to address Transition of Care Barriers: knowledge deficit related to DM and stroke verbalize basic understanding of  DMII and stroke disease process and self health management plan as evidenced by adherence to care plan take all medications exactly as prescribed and will call provider for medication related questions as evidenced by med compliance attend all scheduled medical appointments:   as evidenced by attending PCP and specialist appt  through collaboration with RN Care manager, provider, and care team.  Interventions: Evaluation of current treatment plan related to  self management and patient's adherence to plan as established by provider   Diabetes Interventions:  (Status:  New goal.) Short Term Goal Assessed patient's understanding of A1c goal: <7% Provided education to patient about basic DM disease process Reviewed medications with patient and discussed importance of medication adherence Counseled on importance of regular laboratory monitoring as prescribed Discussed plans with patient for ongoing care management follow up and provided patient with direct contact information for care management team Assessed social determinant of health barriers Lab Results  Component Value Date   HGBA1C 12.2 (H) 10/12/2022    Stroke:  (Status:New goal.) Short Term Goal Reviewed Importance of taking all medications as  prescribed Reviewed Importance of attending all scheduled provider appointments Assessed for signs and symptoms of stroke Reviewed referrals to home health Discussed modifiable risk factors  Patient Goals/Self-Care Activities: Participate in Transition of Care Program/Attend TOC scheduled calls Take all medications as prescribed Attend all scheduled provider appointments Call provider office for new concerns or questions  check blood sugar at prescribed times: as ordered  Follow Up Plan:  Telephone follow up appointment with care management team member scheduled for:  next week The patient has been provided with contact information for the care management team and has been advised to call with any health related questions or concerns.          Antionette Fairy, RN,BSN,CCM RN Care Manager Transitions of Care  [place]  Direct Phone: [phone] Toll Free: [phone] Fax: [phone]

## 2022-10-18 NOTE — Patient Instructions (Signed)
Visit Information  Thank you for taking time to visit with me today. Please don't hesitate to contact me if I can be of assistance to you before our next scheduled telephone appointment.  Our next appointment is by telephone on 10/26/22 at 1pm  Following is a copy of your care plan:   Goals Addressed             This Visit's Progress    TOC Care Plan       Current Barriers:  Chronic Disease Management support and education needs related to DMII and Stroke   RNCM Clinical Goal(s):  Patient will work with the Care Management team over the next 30 days to address Transition of Care Barriers: knowledge deficit related to DM and stroke verbalize basic understanding of  DMII and stroke disease process and self health management plan as evidenced by adherence to care plan take all medications exactly as prescribed and will call provider for medication related questions as evidenced by med compliance attend all scheduled medical appointments:   as evidenced by attending PCP and specialist appt  through collaboration with RN Care manager, provider, and care team.   Interventions: Evaluation of current treatment plan related to  self management and patient's adherence to plan as established by provider   Diabetes Interventions:  (Status:  New goal.) Short Term Goal Assessed patient's understanding of A1c goal: <7% Provided education to patient about basic DM disease process Reviewed medications with patient and discussed importance of medication adherence Counseled on importance of regular laboratory monitoring as prescribed Discussed plans with patient for ongoing care management follow up and provided patient with direct contact information for care management team Assessed social determinant of health barriers Lab Results  Component Value Date   HGBA1C 12.2 (H) 10/12/2022    Stroke:  (Status:New goal.) Short Term Goal Reviewed Importance of taking all medications as  prescribed Reviewed Importance of attending all scheduled provider appointments Assessed for signs and symptoms of stroke Reviewed referrals to home health Discussed modifiable risk factors  Patient Goals/Self-Care Activities: Participate in Transition of Care Program/Attend TOC scheduled calls Take all medications as prescribed Attend all scheduled provider appointments Call provider office for new concerns or questions  check blood sugar at prescribed times: as ordered  Follow Up Plan:  Telephone follow up appointment with care management team member scheduled for:  next week The patient has been provided with contact information for the care management team and has been advised to call with any health related questions or concerns.          Patient verbalizes understanding of instructions and care plan provided today and agrees to view in MyChart. Active MyChart status and patient understanding of how to access instructions and care plan via MyChart confirmed with patient.     Telephone follow up appointment with care management team member scheduled for: The patient has been provided with contact information for the care management team and has been advised to call with any health related questions or concerns.   Please call the care guide team at [phone] if you need to cancel or reschedule your appointment.   Please call the Suicide and Crisis Lifeline: 988 call the [place] National Suicide Prevention Lifeline: 620-301-2424 or TTY: (518)712-9870 TTY 906-171-3362) to talk to a trained counselor if you are experiencing a Mental Health or Behavioral Health Crisis or need someone to talk to.  Antionette Fairy, RN,BSN,CCM RN Care Manager Transitions of Care  [place]  Direct  Phone: [phone] Toll Free: [phone] Fax: [phone]

## 2022-10-19 LAB — CULTURE, BLOOD (ROUTINE X 2)
Culture: NO GROWTH
Culture: NO GROWTH
Special Requests: ADEQUATE
Special Requests: ADEQUATE

## 2022-10-22 ENCOUNTER — Encounter: Payer: Self-pay | Admitting: Family Medicine

## 2022-10-22 ENCOUNTER — Telehealth: Payer: Medicare Other | Admitting: Family Medicine

## 2022-10-22 VITALS — BP 123/58 | HR 76 | Ht 60.0 in

## 2022-10-22 DIAGNOSIS — Z794 Long term (current) use of insulin: Secondary | ICD-10-CM

## 2022-10-22 DIAGNOSIS — E782 Mixed hyperlipidemia: Secondary | ICD-10-CM

## 2022-10-22 DIAGNOSIS — R519 Headache, unspecified: Secondary | ICD-10-CM

## 2022-10-22 DIAGNOSIS — E1142 Type 2 diabetes mellitus with diabetic polyneuropathy: Secondary | ICD-10-CM

## 2022-10-22 DIAGNOSIS — I639 Cerebral infarction, unspecified: Secondary | ICD-10-CM

## 2022-10-22 DIAGNOSIS — I119 Hypertensive heart disease without heart failure: Secondary | ICD-10-CM

## 2022-10-22 DIAGNOSIS — Z8673 Personal history of transient ischemic attack (TIA), and cerebral infarction without residual deficits: Secondary | ICD-10-CM

## 2022-10-22 DIAGNOSIS — N182 Chronic kidney disease, stage 2 (mild): Secondary | ICD-10-CM | POA: Diagnosis not present

## 2022-10-22 NOTE — Assessment & Plan Note (Signed)
On Losartan 50 mg daily and Farxiga 10 mg. Next appt with nephrologist 11/15/22.

## 2022-10-22 NOTE — Assessment & Plan Note (Signed)
Problem is not well controlled. We discussed risk for further complications if problem does not improved. Continue Lantus 14 U daily,Farxiga 10 mg daily, and Humalog 4 U tid before meals. Keeps appt with endocrinologist.

## 2022-10-22 NOTE — Assessment & Plan Note (Signed)
BP seems to be adequately controlled. Continue Amlodipine 5 mg daily and losartan 50 mg daily as well as low salt diet. Continue monitoring BP regularly.

## 2022-10-22 NOTE — Assessment & Plan Note (Addendum)
Currently on dual antiplatelet therapy, Aspirin 81 mg daily and Brillinta 90 mg bid, the latter one to complete on 11/13/22, when she will start Plavix 75 mg daily. We discussed the importance of aggressive management of CV risk factors for secondary prevention. She has f/u appt with neurologist 11/17/22. Has a loop recorder, has appt with cardiologist 11/15/22. PT evaluation tomorrow. Her sister is staying with her for another week, then she will be returning to [place]. She has an aid from 10 am to 6 pm.

## 2022-10-22 NOTE — Progress Notes (Addendum)
Virtual Visit via Video Note I connected with Sarah [name] on 10/22/2022 by a video enabled telemedicine application and verified that I am speaking with the correct person using two identifiers. Location patient: home Location provider:work office Persons participating in the virtual visit: patient, sister,scribe, provider  I discussed the limitations of evaluation and management by telemedicine and the availability of in person appointments. The patient expressed understanding and agreed to proceed.  Chief Complaint  Patient presents with   Hospitalization Follow-up   HPI: [name] is a 77 y.o. female with a PMHx significant for CAD, HTN,CVA, DM II, CKD II, anxiety, PAD, and GERD, who was seen on video today with her sister for post hospital follow-up. TOC call on 10/18/22.  She was hospitalized on 9/30 after presenting to the [facility] with 2 days hx of right-sided weakness. She was discharged on 10/5.  CT head was negative for acute abnormalities, MRI of the brain showed scattered small acute infarcts along the posterior left frontal cortex/white matter in the left occipital cortex and left lower cerebellum.  Neuro consultation during hospitalization. TEE showed Nodular density noted on left coronary cusp (possible nodular calcification); mobile density on right coronary cusp (vegetation vs lambl's excrescenc; less likely fibroelastoma)   Started on Aspirin 81 mg daily and Brillinta 90 mg bid, the latter one to take for 1 month, then she is supposed to resume Plavix.  -She endorses a parietal, achy, headache since her hospitalization which she rates as a 5/10. She denies associated symptoms.   She denies chest pain, SOB, palpitations, or appetite changes since her hospitalization.   She states her balance has improved, but not by much. Reports a fall this morning after taking a bath, she was drying, lost balance and fell on her left side. She endorses  left sided soreness from the fall, no deformities, edema,or changes in ROM.  Her sister reports that her LE edema has improved since hospitalization. Negative for orthopnea or PND.   -DM II: She is on Lantus 14 U daily and Humalog 4 U tid. She has not been compliant with dietary recommendations and not taking her meds sometimes. She has not been checking her blood sugar since returning from the hospital.  Lab Results  Component Value Date   HGBA1C 12.2 (H) 10/12/2022   She has appointments with endocrinology on 10/14.  -HTN on Amlodipine 5 mg daily, Losartan 50 mg daily -CKD II: Next nephrology appt on 11/4.  Negative fro dysuria,hematuria,or decreased urine output. Lab Results  Component Value Date   NA 139 10/15/2022   CL 109 10/15/2022   K 3.5 10/15/2022   CO2 21 (L) 10/15/2022   BUN 13 10/15/2022   CREATININE 0.95 10/15/2022   GFRNONAA >60 10/15/2022   CALCIUM 8.8 (L) 10/15/2022   PHOS 3.5 10/14/2022   ALBUMIN 1.8 (L) 10/14/2022   GLUCOSE 134 (H) 10/15/2022   Lab Results  Component Value Date   WBC 7.5 10/14/2022   HGB 10.8 (L) 10/14/2022   HCT 32.4 (L) 10/14/2022   MCV 94.5 10/14/2022   PLT 288 10/14/2022   Lab Results  Component Value Date   ALT 34 10/14/2022   AST 40 10/14/2022   ALKPHOS 61 10/14/2022   BILITOT 0.3 10/14/2022   Tomorrow PT/OT evaluation at home. She will have home care from 10am-6pm after her sister returns to [place].   ROS: See pertinent positives and negatives per HPI.  Past Medical History:  Diagnosis Date   Bilateral carpal  tunnel syndrome 03/27/2019   Boils    Glaucoma    Heart murmur    HTN (hypertension)    Hx of adenomatous colonic polyps    Hyperlipidemia    Osteoporosis    Pneumonia    Stroke (HCC)    Type II or unspecified type diabetes mellitus without mention of complication, uncontrolled     Past Surgical History:  Procedure Laterality Date   ABDOMINAL HYSTERECTOMY     CARPAL TUNNEL RELEASE     right    COLONOSCOPY  12-10-10   per Dr. Arlyce Dice, clear, repeat in 7 yrs    CORONARY STENT INTERVENTION N/A 03/11/2020   Procedure: CORONARY STENT INTERVENTION;  Surgeon: Corky Crafts, MD;  Location: [facility] INVASIVE CV LAB;  Service: Cardiovascular;  Laterality: N/A;   CORONARY ULTRASOUND/IVUS N/A 03/11/2020   Procedure: Intravascular Ultrasound/IVUS;  Surgeon: Corky Crafts, MD;  Location: [facility] INVASIVE CV LAB;  Service: Cardiovascular;  Laterality: N/A;   INCISION AND DRAINAGE PERIRECTAL ABSCESS N/A 09/26/2015   Procedure: IRRIGATION AND DEBRIDEMENT PERIRECTAL ABSCESS;  Surgeon: Rodman Pickle, MD;  Location: [facility] OR;  Service: General;  Laterality: N/A;   KNEE ARTHROSCOPY     right   LEFT HEART CATH AND CORONARY ANGIOGRAPHY N/A 03/11/2020   Procedure: LEFT HEART CATH AND CORONARY ANGIOGRAPHY;  Surgeon: Corky Crafts, MD;  Location: [facility] INVASIVE CV LAB;  Service: Cardiovascular;  Laterality: N/A;   LOOP RECORDER INSERTION N/A 10/19/2016   Procedure: LOOP RECORDER INSERTION;  Surgeon: Hillis Range, MD;  Location: [facility] INVASIVE CV LAB;  Service: Cardiovascular;  Laterality: N/A;   ORIF ANKLE FRACTURE Right 03/24/2012   Procedure: OPEN REDUCTION INTERNAL FIXATION (ORIF) ANKLE FRACTURE;  Surgeon: Nadara Mustard, MD;  Location: [facility] OR;  Service: Orthopedics;  Laterality: Right;  Open Reduction Internal Fixation Right Bimalleolar ankle fracture   POLYPECTOMY     TEE WITHOUT CARDIOVERSION N/A 10/18/2016   Procedure: TRANSESOPHAGEAL ECHOCARDIOGRAM (TEE);  Surgeon: Pricilla Riffle, MD;  Location: [facility] ENDOSCOPY;  Service: Cardiovascular;  Laterality: N/A;   TEE WITHOUT CARDIOVERSION N/A 10/14/2022   Procedure: TRANSESOPHAGEAL ECHOCARDIOGRAM;  Surgeon: Lewayne Bunting, MD;  Location: [facility] INVASIVE CV LAB;  Service: Cardiovascular;  Laterality: N/A;   TONSILLECTOMY      Family History  Problem Relation Age of Onset   Diabetes Mother    Hypertension Mother    Stroke Father    Stroke Maternal Uncle     Stroke Paternal Uncle    Colon cancer Neg Hx    Esophageal cancer Neg Hx    Rectal cancer Neg Hx    Stomach cancer Neg Hx    Inflammatory bowel disease Neg Hx    Liver disease Neg Hx    Pancreatic cancer Neg Hx     Social History   Socioeconomic History   Marital status: Single    Spouse name: Not on file   Number of children: Not on file   Years of education: Not on file   Highest education level: Not on file  Occupational History   Not on file  Tobacco Use   Smoking status: Former    Current packs/day: 0.00    Types: Cigarettes    Quit date: 10/15/2016    Years since quitting: 6.0   Smokeless tobacco: Never   Tobacco comments:    smokes occ.   Vaping Use   Vaping status: Never Used  Substance and Sexual Activity   Alcohol use: Yes    Alcohol/week: 0.0 standard drinks  of alcohol    Comment: occ   Drug use: No   Sexual activity: Not on file  Other Topics Concern   Not on file  Social History Narrative   Caffiene coffee 1 cup in am.   Retired from [employer].     Social Determinants of Health   Financial Resource Strain: Low Risk  (05/03/2022)   Overall Financial Resource Strain (CARDIA)    Difficulty of Paying Living Expenses: Not hard at all  Food Insecurity: No Food Insecurity (10/18/2022)   Hunger Vital Sign    Worried About Running Out of Food in the Last Year: Never true    Ran Out of Food in the Last Year: Never true  Transportation Needs: No Transportation Needs (10/18/2022)   PRAPARE - Administrator, Civil Service (Medical): No    Lack of Transportation (Non-Medical): No  Physical Activity: Insufficiently Active (05/03/2022)   Exercise Vital Sign    Days of Exercise per Week: 2 days    Minutes of Exercise per Session: 10 min  Stress: No Stress Concern Present (05/03/2022)   [employer] of Occupational Health - Occupational Stress Questionnaire    Feeling of Stress : Not at all  Social Connections: Moderately Integrated  (05/03/2022)   Social Connection and Isolation Panel [NHANES]    Frequency of Communication with Friends and Family: More than three times a week    Frequency of Social Gatherings with Friends and Family: More than three times a week    Attends Religious Services: More than 4 times per year    Active Member of [employer] or Organizations: Yes    Attends Banker Meetings: More than 4 times per year    Marital Status: Widowed  Intimate Partner Violence: Not At Risk (10/12/2022)   Humiliation, Afraid, Rape, and Kick questionnaire    Fear of Current or Ex-Partner: No    Emotionally Abused: No    Physically Abused: No    Sexually Abused: No     Current Outpatient Medications:    amLODipine (NORVASC) 5 MG tablet, Take 1 tablet (5 mg total) by mouth daily., Disp: 90 tablet, Rfl: 3   aspirin EC 81 MG tablet, Take 1 tablet (81 mg total) by mouth daily. Swallow whole., Disp: 30 tablet, Rfl: 12   cholecalciferol (VITAMIN D3) 25 MCG (1000 UNIT) tablet, Take 1,000 Units by mouth daily., Disp: , Rfl:    [START ON 11/13/2022] clopidogrel (PLAVIX) 75 MG tablet, Take 1 tablet (75 mg total) by mouth daily. Start AFTER completing the brilinta prescription, Disp: 90 tablet, Rfl: 1   dapagliflozin propanediol (FARXIGA) 10 MG TABS tablet, Take 1 tablet (10 mg total) by mouth daily., Disp: 90 tablet, Rfl: 3   ezetimibe (ZETIA) 10 MG tablet, Take 1 tablet (10 mg total) by mouth daily., Disp: 90 tablet, Rfl: 3   furosemide (LASIX) 20 MG tablet, Take 1 tablet by mouth daily (only as needed), Disp: 90 tablet, Rfl: 3   insulin glargine, 1 Unit Dial, (TOUJEO SOLOSTAR) 300 UNIT/ML Solostar Pen, Inject 14 Units into the skin daily in the afternoon., Disp: 15 mL, Rfl: 3   insulin lispro (HUMALOG KWIKPEN) 100 UNIT/ML KwikPen, Max daily 30 units, Disp: 30 mL, Rfl: 3   latanoprost (XALATAN) 0.005 % ophthalmic solution, Place 1 drop into both eyes at bedtime., Disp: , Rfl:    losartan (COZAAR) 50 MG tablet, TAKE 1  TABLET BY MOUTH DAILY, Disp: 90 tablet, Rfl: 3   Magnesium 250 MG TABS,  Take 250 mg by mouth daily., Disp: , Rfl:    nitroGLYCERIN (NITROSTAT) 0.4 MG SL tablet, Place 1 tablet (0.4 mg total) under the tongue every 5 (five) minutes as needed for chest pain. one tab every 5 minutes up to 3 tablets total over 15 minutes., Disp: 25 tablet, Rfl: 3   ondansetron (ZOFRAN) 4 MG tablet, Take 1 tablet (4 mg total) by mouth 2 (two) times daily as needed for nausea or vomiting., Disp: 60 tablet, Rfl: 1   rosuvastatin (CRESTOR) 20 MG tablet, Take 1 tablet (20 mg total) by mouth daily., Disp: 90 tablet, Rfl: 3   sertraline (ZOLOFT) 50 MG tablet, TAKE 1 TABLET(50 MG) BY MOUTH DAILY (Patient taking differently: Take 50 mg by mouth daily.), Disp: 90 tablet, Rfl: 1   ticagrelor (BRILINTA) 90 MG TABS tablet, Take 1 tablet (90 mg total) by mouth 2 (two) times daily., Disp: 60 tablet, Rfl: 0   topiramate (TOPAMAX) 25 MG tablet, Take 1 tablet (25 mg total) by mouth at bedtime., Disp: 90 tablet, Rfl: 3  EXAM:  VITALS per patient if applicable:BP (!) 123/58   Pulse 76   Ht 5' (1.524 m)   BMI 21.96 kg/m   GENERAL: alert, oriented, appears well and in no acute distress  HEENT: atraumatic, conjunctiva clear, no obvious abnormalities on inspection of external nose and ears  NECK: normal movements of the head and neck  LUNGS: on inspection no signs of respiratory distress, breathing rate appears normal, no obvious gross SOB, gasping or wheezing  CV: no obvious cyanosis  MS: moves all visible extremities without noticeable abnormality.  PSYCH/NEURO: pleasant and cooperative, no obvious depression or anxiety, speech and thought processing grossly intact. Standing up during visit, no facial weakness appreciated.  ASSESSMENT AND PLAN:  Discussed the following assessment and plan:  Cerebrovascular accident (CVA), unspecified mechanism (HCC) Assessment & Plan: Currently on dual antiplatelet therapy, Aspirin 81 mg  daily and Brillinta 90 mg bid, the latter one to complete on 11/13/22, when she will start Plavix 75 mg daily. We discussed the importance of aggressive management of CV risk factors for secondary prevention. She has f/u appt with neurologist 11/17/22. Has a loop recorder, has appt with cardiologist 11/15/22. PT evaluation tomorrow. Her sister is staying with her for another week, then she will be returning to [place]. She has an aid from 10 am to 6 pm.   Hypertension with heart disease Assessment & Plan: BP seems to be adequately controlled. Continue Amlodipine 5 mg daily and losartan 50 mg daily as well as low salt diet. Continue monitoring BP regularly.   Type 2 diabetes mellitus with diabetic polyneuropathy, with long-term current use of insulin (HCC) Assessment & Plan: Problem is not well controlled. We discussed risk for further complications if problem does not improved. Continue Lantus 14 U daily,Farxiga 10 mg daily, and Humalog 4 U tid before meals. Keeps appt with endocrinologist.   CKD (chronic kidney disease), stage II Assessment & Plan: On Losartan 50 mg daily and Farxiga 10 mg. Next appt with nephrologist 11/15/22.   Mobile density on R coronary cusp (vegetation vs lambl's excrescence)  Will follow with cardiologist on 11/15/22.  Headache, unspecified headache type  Hx of chronic headache, follow with neurologist and on Topamax. Instructed about warning signs.  Mixed hyperlipidemia  Well controlled, last LDL 44 on 10/12/22. Continue Zetia 10 mg daily and Rosuvastatin 20 mg daily.  We discussed possible serious and likely etiologies, options for evaluation and workup, limitations of telemedicine visit  vs in person visit, treatment, treatment risks and precautions. The patient was advised to call back or seek an in-person evaluation if the symptoms worsen or if the condition fails to improve as anticipated. I discussed the assessment and treatment plan with the patient.  The patient was provided an opportunity to ask questions and all were answered. The patient agreed with the plan and demonstrated an understanding of the instructions.  I, Rolla Etienne Wierda, acting as a scribe for Karem Tomaso [place], MD., have documented all relevant documentation on the behalf of Gevorg Brum [place], MD, as directed by  Delena Casebeer [place], MD while in the presence of Vicenta Olds [place], MD.   I, Breuna Loveall [place], MD, have reviewed all documentation for this visit. The documentation on 10/22/22 for the exam, diagnosis, procedures, and orders are all accurate and complete.  No follow-ups on file.  Nisaiah Bechtol [place], MD

## 2022-10-25 ENCOUNTER — Telehealth: Payer: Self-pay | Admitting: Family Medicine

## 2022-10-25 ENCOUNTER — Ambulatory Visit: Payer: Medicare Other | Admitting: Internal Medicine

## 2022-10-25 ENCOUNTER — Encounter: Payer: Self-pay | Admitting: Internal Medicine

## 2022-10-25 VITALS — BP 130/66 | HR 82 | Ht 60.0 in | Wt 109.0 lb

## 2022-10-25 DIAGNOSIS — E1165 Type 2 diabetes mellitus with hyperglycemia: Secondary | ICD-10-CM | POA: Diagnosis not present

## 2022-10-25 DIAGNOSIS — Z794 Long term (current) use of insulin: Secondary | ICD-10-CM

## 2022-10-25 DIAGNOSIS — Z23 Encounter for immunization: Secondary | ICD-10-CM

## 2022-10-25 LAB — POCT GLUCOSE (DEVICE FOR HOME USE): POC Glucose: 257 mg/dL — AB (ref 70–99)

## 2022-10-25 NOTE — Progress Notes (Signed)
[facility] Summary Report / Loop Recorder 

## 2022-10-25 NOTE — Progress Notes (Unsigned)
Name: Sarah [name]  Age/ Sex: 77 y.o., female   MRN/ DOB: [id number], 10-04-1945     PCP: [place], [name], MD   Reason for Endocrinology Evaluation: Type 2 Diabetes Mellitus  Initial Endocrine Consultative Visit: 08/15/2019    PATIENT IDENTIFIER: Sarah [name] is a 77 y.o. female with a past medical history of T2DM, CVA, Dyslipidemia  And CAD (S/P PCI 03/2020). The patient has followed with Endocrinology clinic since 08/15/2019 for consultative assistance with management of her diabetes.  DIABETIC HISTORY:  Sarah [name] was diagnosed with DM many years ago. Her hemoglobin A1c has ranged from 10.3% in 2020, peaking at 16.3% in 2016.   On her initial visit to our clinic she had an A1c of 12.5 % She was on MDI regimen but was not taking Metformin due to GI side effects so we stopped it. We adjusted MDI regimen and continued Victoza    Started Farxiga 02/2020    Lives with mother who is 92 yrs ago   She was hospitalized 10/2022 for acute CVA.  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 Continues to follow up with [name]   Chauncey Mann  was discontinued by nephrology   SUBJECTIVE:   During the last visit (05/10/2022): A1c 8.5 %    Today (10/25/2022): Sarah [name] is here for a follow up on diabetes management. She is accompanied by her sister ( [name])from [place] today .   She presented to the ED with strokelike symptoms 10/11/2022, MRI of the brain shows scattered small acute infarcts.  TEE showed nodular density on the left coronary cusp . she was discharged on ASA and Brilinta for 1 month,   She has not been using the [facility], initial reasoning was due to easy bleeding since she has been on Brilinta, but when attempting to download the Dexcom there has been no data since July  2024.   Denies nausea or vomiting  Denies constipation or diarrhea    HOME DIABETES REGIMEN:  Victoza 1.2 mg daily  Toujeo 14 units daily Humalog 4 units with each meal  Farxiga 10 mg daily  Correction factor: Humalog (BG -130/30)     Statin: yes ACE-I/ARB: yes     CONTINUOUS GLUCOSE MONITORING RECORD INTERPRETATION    D  DIABETIC COMPLICATIONS: Microvascular complications:  Retinopathy, neuropathy  Denies: CKD Last Eye Exam: Completed 12/24/2020  Macrovascular complications:  CVA, CAD (S/P PCI 03/2020 Denies: PVD   HISTORY:  Past Medical History:  Past Medical History:  Diagnosis Date   Bilateral carpal tunnel syndrome 03/27/2019   Boils    Glaucoma    Heart murmur    HTN (hypertension)    Hx of adenomatous colonic polyps    Hyperlipidemia    Osteoporosis    Pneumonia    Stroke (HCC)    Type II or unspecified type diabetes mellitus without mention of complication, uncontrolled    Past Surgical History:  Past Surgical History:  Procedure Laterality Date   ABDOMINAL HYSTERECTOMY     CARPAL TUNNEL RELEASE     right   COLONOSCOPY  12-10-10   per Dr. Arlyce Dice, clear, repeat in 7 yrs    CORONARY STENT INTERVENTION N/A 03/11/2020   Procedure: CORONARY STENT INTERVENTION;  Surgeon: Corky Crafts, MD;  [facility] INVASIVE CV LAB;  Service: Cardiovascular;  Laterality:  N/A;   CORONARY ULTRASOUND/IVUS N/A 03/11/2020   Procedure: Intravascular Ultrasound/IVUS;  Surgeon: Corky Crafts, MD;  Location: [facility] INVASIVE CV LAB;  Service: Cardiovascular;  Laterality: N/A;   INCISION AND DRAINAGE PERIRECTAL ABSCESS N/A 09/26/2015   Procedure: IRRIGATION AND DEBRIDEMENT PERIRECTAL ABSCESS;  Surgeon: Rodman Pickle, MD;  Location: [facility] OR;  Service: General;  Laterality: N/A;   KNEE ARTHROSCOPY     right   LEFT HEART CATH AND CORONARY ANGIOGRAPHY N/A 03/11/2020   Procedure: LEFT HEART CATH AND CORONARY ANGIOGRAPHY;  Surgeon: Corky Crafts, MD;  Location:  [facility] INVASIVE CV LAB;  Service: Cardiovascular;  Laterality: N/A;   LOOP RECORDER INSERTION N/A 10/19/2016   Procedure: LOOP RECORDER INSERTION;  Surgeon: Hillis Range, MD;  Location: [facility] INVASIVE CV LAB;  Service: Cardiovascular;  Laterality: N/A;   ORIF ANKLE FRACTURE Right 03/24/2012   Procedure: OPEN REDUCTION INTERNAL FIXATION (ORIF) ANKLE FRACTURE;  Surgeon: Nadara Mustard, MD;  Location: [facility] OR;  Service: Orthopedics;  Laterality: Right;  Open Reduction Internal Fixation Right Bimalleolar ankle fracture   POLYPECTOMY     TEE WITHOUT CARDIOVERSION N/A 10/18/2016   Procedure: TRANSESOPHAGEAL ECHOCARDIOGRAM (TEE);  Surgeon: Pricilla Riffle, MD;  Location: [facility] ENDOSCOPY;  Service: Cardiovascular;  Laterality: N/A;   TEE WITHOUT CARDIOVERSION N/A 10/14/2022   Procedure: TRANSESOPHAGEAL ECHOCARDIOGRAM;  Surgeon: Lewayne Bunting, MD;  Location: [facility] INVASIVE CV LAB;  Service: Cardiovascular;  Laterality: N/A;   TONSILLECTOMY     Social History:  reports that she quit smoking about 6 years ago. Her smoking use included cigarettes. She has never used smokeless tobacco. She reports current alcohol use. She reports that she does not use drugs. Family History:  Family History  Problem Relation Age of Onset   Diabetes Mother    Hypertension Mother    Stroke Father    Stroke Maternal Uncle    Stroke Paternal Uncle    Colon cancer Neg Hx    Esophageal cancer Neg Hx    Rectal cancer Neg Hx    Stomach cancer Neg Hx    Inflammatory bowel disease Neg Hx    Liver disease Neg Hx    Pancreatic cancer Neg Hx      HOME MEDICATIONS: Allergies as of 10/25/2022       Reactions   Penicillins Other (See Comments)   Does not know reaction        Medication List        Accurate as of October 25, 2022 12:23 PM. If you have any questions, ask your nurse or doctor.          amLODipine 5 MG tablet Commonly known as: NORVASC Take 1 tablet (5 mg total) by mouth daily.   aspirin EC 81 MG tablet Take 1  tablet (81 mg total) by mouth daily. Swallow whole.   Brilinta 90 MG Tabs tablet Generic drug: ticagrelor Take 1 tablet (90 mg total) by mouth 2 (two) times daily.   cholecalciferol 25 MCG (1000 UNIT) tablet Commonly known as: VITAMIN D3 Take 1,000 Units by mouth daily.   clopidogrel 75 MG tablet Commonly known as: PLAVIX Take 1 tablet (75 mg total) by mouth daily. Start AFTER completing the brilinta prescription Start taking on: November 13, 2022   dapagliflozin propanediol 10 MG Tabs tablet Commonly known as: Marcelline Deist Take 1 tablet (10 mg total) by mouth daily.   ezetimibe 10 MG tablet Commonly known as: ZETIA Take 1 tablet (10 mg total) by mouth daily.   furosemide  20 MG tablet Commonly known as: LASIX Take 1 tablet by mouth daily (only as needed)   insulin lispro 100 UNIT/ML KwikPen Commonly known as: HumaLOG KwikPen Max daily 30 units   latanoprost 0.005 % ophthalmic solution Commonly known as: XALATAN Place 1 drop into both eyes at bedtime.   losartan 50 MG tablet Commonly known as: COZAAR TAKE 1 TABLET BY MOUTH DAILY   Magnesium 250 MG Tabs Take 250 mg by mouth daily.   nitroGLYCERIN 0.4 MG SL tablet Commonly known as: NITROSTAT Place 1 tablet (0.4 mg total) under the tongue every 5 (five) minutes as needed for chest pain. one tab every 5 minutes up to 3 tablets total over 15 minutes.   ondansetron 4 MG tablet Commonly known as: Zofran Take 1 tablet (4 mg total) by mouth 2 (two) times daily as needed for nausea or vomiting.   rosuvastatin 20 MG tablet Commonly known as: Crestor Take 1 tablet (20 mg total) by mouth daily.   sertraline 50 MG tablet Commonly known as: ZOLOFT TAKE 1 TABLET(50 MG) BY MOUTH DAILY What changed:  how much to take how to take this when to take this additional instructions   topiramate 25 MG tablet Commonly known as: Topamax Take 1 tablet (25 mg total) by mouth at bedtime.   Toujeo SoloStar 300 UNIT/ML Solostar  Pen Generic drug: insulin glargine (1 Unit Dial) Inject 14 Units into the skin daily in the afternoon.         OBJECTIVE:   Vital Signs: BP 130/66 (BP Location: Left Arm, Patient Position: Sitting, Cuff Size: Small)   Pulse 82   Ht 5' (1.524 m)   Wt 109 lb (49.4 kg)   SpO2 95%   BMI 21.29 kg/m   Wt Readings from Last 3 Encounters:  10/25/22 109 lb (49.4 kg)  10/12/22 112 lb 7 oz (51 kg)  09/07/22 115 lb (52.2 kg)     Exam: General: Pt appears well and is in NAD  Lungs: Clear with good BS bilat with no rales, rhonchi, or wheezes  Heart: RRR   Extremities: No pretibial edema.   Neuro: MS is good with appropriate affect, pt is alert and Ox3   DM foot exam: 05/10/2022 The skin of the feet is intact without sores or ulcerations. The pedal pulses are 2+ on right and 2+ on left. The sensation is intact to a screening 5.07, 10 gram monofilament bilaterally    DATA REVIEWED:  Lab Results  Component Value Date   HGBA1C 12.2 (H) 10/12/2022   HGBA1C 8.5 (A) 05/10/2022   HGBA1C 12.6 (H) 10/25/2021     Latest Reference Range & Units 10/15/22 07:11  Sodium 135 - 145 mmol/L 139  Potassium 3.5 - 5.1 mmol/L 3.5  Chloride 98 - 111 mmol/L 109  CO2 22 - 32 mmol/L 21 (L)  Glucose 70 - 99 mg/dL 161 (H)  BUN 8 - 23 mg/dL 13  Creatinine 0.96 - 0.45 mg/dL 4.09  Calcium 8.9 - 81.1 mg/dL 8.8 (L)  Anion gap 5 - 15  9  GFR, Estimated >60 mL/min >60   In office BG 257 mg/dL     ASSESSMENT / PLAN / RECOMMENDATIONS:   1) Type 2 Diabetes Mellitus, Poorly controlled, With neuropathic and  macrovascular  complications - Most recent A1c of 12.2  %. Goal A1c < 7.0 %.    -A1c has trended up from 8.5% to 12.2%, this is due to medication nonadherence -The patient does have a home care agent  8 hours every day during weekdays, but the aide is only able to remind the patient to take the medication but does not administer medications for her -I again emphasized the importance of compliance  with medication intake, she already has had a CVA and recent symptoms of strokelike -She also has not used the Dexcom since July 2024 -I will increase her insulin as below -Sister had questions about mechanism of action of variable glycemic agents, which we discussed today   MEDICATIONS: -Continue Farxiga  10 mg, 1 tablet daily  -Continue Victoza 1.2 mg daily  -Increase Toujeo 20 units daily -Increase Humalog 6 units TIDQAC  -Continue CF : Humalog (BG-130/30) TIDQAC   EDUCATION / INSTRUCTIONS: BG monitoring instructions: Patient is instructed to check her blood sugars 3 times a day, before meals  Call [facility] if: BG persistently < 70 I reviewed the Rule of 15 for the treatment of hypoglycemia in detail with the patient. Literature supplied.   2) Diabetic complications:  Eye: there's a questionable diabetic retinopathy from her history Neuro/ Feet: Does have known diabetic peripheral neuropathy .  Renal: Patient does not have known baseline CKD. She   is  on an ACEI/ARB at present.       F/U in 4 months    Signed electronically by: Lyndle Herrlich, MD  [facility] Endocrinology  [facility] [address] [place] [place], [place] [address] Phone: [phone] FAX: [phone]   CC: [place], [name], MD [address] [place] [address] Phone: [phone]  Fax: [phone]  Return to Endocrinology clinic as below: Future Appointments  Date Time Provider Department Center  10/26/2022  1:00 PM Florance, Adrienne Mocha, RN [facility] None  11/15/2022  7:10 AM [facility] DEVICE REMOTES [facility]  11/17/2022 10:15 AM Ihor Austin, NP [facility] None  12/20/2022  7:10 AM [facility] DEVICE REMOTES [facility]  01/24/2023  7:00 AM [facility] DEVICE REMOTES [facility]  02/28/2023  7:00 AM [facility] DEVICE REMOTES [facility]  03/17/2023  2:15 PM Ihor Austin, NP  [facility] None  04/04/2023  7:00 AM [facility] DEVICE REMOTES [facility]  05/09/2023  9:30 AM [facility]-ANNUAL WELLNESS VISIT [facility]

## 2022-10-25 NOTE — Telephone Encounter (Addendum)
Barry Dienes RN with [facility]  [phone]  Verbal Order - Speech Therapy Request  1 wk 5   Also: Pt declined CNA, as she already has a private duty nurse Should Pt continue taking hydrolizine? Should the Humolog 4 units be taken before meals? Is MD aware Pt is not taking magnesium because she cannot swallow following stroke?  Please advise.

## 2022-10-25 NOTE — Patient Instructions (Signed)
-   Increase  Toujeo 20 units once daily  - Continue  Farxiga 10 mg, 1 tablet daily  - Continue  Victoza 1.2 mg daily  - Increase Humalog 6 units with each meal PLUS the scale below if needed - Humalog correctional insulin: ADD extra units on insulin to your meal-time Humalog dose if your blood sugars are higher than 170. Use the scale below to help guide you:   Blood sugar before meal Number of units to inject  Less than 170 0 unit  171 - 210 1 units  211 - 250 2 units  251 - 290 3 units  291 - 330 4 units  331 - 370 5 units  371 - 410 6 units       HOW TO TREAT LOW BLOOD SUGARS (Blood sugar LESS THAN 70 MG/DL) Please follow the RULE OF 15 for the treatment of hypoglycemia treatment (when your (blood sugars are less than 70 mg/dL)   STEP 1: Take 15 grams of carbohydrates when your blood sugar is low, which includes:  3-4 GLUCOSE TABS  OR 3-4 OZ OF JUICE OR REGULAR SODA OR ONE TUBE OF GLUCOSE GEL    STEP 2: RECHECK blood sugar in 15 MINUTES STEP 3: If your blood sugar is still low at the 15 minute recheck --> then, go back to STEP 1 and treat AGAIN with another 15 grams of carbohydrates.

## 2022-10-26 ENCOUNTER — Ambulatory Visit: Payer: Self-pay

## 2022-10-26 NOTE — Patient Instructions (Signed)
Visit Information  Thank you for taking time to visit with me today. Please don't hesitate to contact me if I can be of assistance to you before our next scheduled telephone appointment.  Our next appointment is by telephone on 11/02/22 at 1:30pm  Following is a copy of your care plan:   Goals Addressed             This Visit's Progress    TOC Care Plan       Current Barriers:  Chronic Disease Management support and education needs related to DMII and Stroke   RNCM Clinical Goal(s):  Patient will work with the Care Management team over the next 30 days to address Transition of Care Barriers: knowledge deficit related to DM and stroke verbalize basic understanding of  DMII and stroke disease process and self health management plan as evidenced by adherence to care plan take all medications exactly as prescribed and will call provider for medication related questions as evidenced by med compliance attend all scheduled medical appointments:   as evidenced by attending PCP and specialist appt  through collaboration with RN Care manager, provider, and care team.   Interventions: Evaluation of current treatment plan related to  self management and patient's adherence to plan as established by provider   Diabetes Interventions:  (Status:  Goal on track:  Yes.) Short Term Goal Assessed patient's understanding of A1c goal: <7% Provided education to patient about basic DM disease process Assessed Diabetes mgmt- spoke with sister-[name] who is assisting pt with managing Diabetes Assessed completion of follow up appts-pt completed endo appt Lab Results  Component Value Date   HGBA1C 12.2 (H) 10/12/2022    Stroke:  (Status:Goal on track: Yes) Short Term Goal Assessed pt's status and condition Assessed for participation with therapy-sister confirms HHPT, OT ,ST have been out to evaluate and work with pt Reviewed fall and safety measures with pt. Reviewed upcoming provider appts  Patient  Goals/Self-Care Activities: Participate in Transition of Care Program/Attend TOC scheduled calls Take all medications as prescribed Attend all scheduled provider appointments Call provider office for new concerns or questions  check blood sugar at prescribed times: as ordered Adhere to diabetic and heart healthy diet  Follow Up Plan:  Telephone follow up appointment with care management team member scheduled for:  next week The patient has been provided with contact information for the care management team and has been advised to call with any health related questions or concerns.          Patient verbalizes understanding of instructions and care plan provided today and agrees to view in MyChart. Active MyChart status and patient understanding of how to access instructions and care plan via MyChart confirmed with patient.     The patient has been provided with contact information for the care management team and has been advised to call with any health related questions or concerns.   Please call the care guide team at [phone] if you need to cancel or reschedule your appointment.   Please call the Suicide and Crisis Lifeline: 988 call the [place] National Suicide Prevention Lifeline: 872-881-8755 or TTY: (360)182-8429 TTY 410-651-7257) to talk to a trained counselor if you are experiencing a Mental Health or Behavioral Health Crisis or need someone to talk to.  Antionette Fairy, RN,BSN,CCM RN Care Manager Transitions of Care  [place]  Direct Phone: [phone] Toll Free: [phone] Fax: [phone]

## 2022-10-26 NOTE — Telephone Encounter (Addendum)
Corrie Dandy, Speech Therapist with [facility] [phone]  *Called back to inform MD of Frequency*  *Ok to leave a detailed message on this line  Verbal Order - Speech Therapy  1 x a wk for 4 wks

## 2022-10-26 NOTE — Patient Outreach (Signed)
Care Management  Transitions of Care Program Transitions of Care Post-discharge week 2   10/26/2022 Name: Sarah [name] MRN: [id number] DOB: June 17, 1945  Subjective: Sarah [name] is a 77 y.o. year old female who is a primary care patient of [place], Timoteo Expose, MD. The Care Management team Engaged with patient Engaged with patient by telephone to assess and address transitions of care needs.   Consent to Services:  Patient was given information about care management services, agreed to services, and gave verbal consent to participate.   Assessment:   Call completed with sister-[name]. HH-ST currently in the home working with pt. She voices that pt completed PCP and endo appt. Neuro appt on 11/17/22. She reports that pt also got flu shot. She shares that pt fell on Friday getting ready for to go to PCP appt. She fell on her right side. Denies any bruising or injuries-just complains of some soreness. Sister will continue to monitor and assess. Blood sugar at present 115. She voices that she is making sure pt adhere to diet restrictions.         SDOH Interventions    Flowsheet Row Care Coordination from 10/26/2022 in [facility] Care Coordination Telephone from 10/18/2022 in [facility] Care Coordination Clinical Support from 05/03/2022 in [facility] Coordination from 03/26/2022 in [facility] Telephone from 03/25/2022 in [facility] Care Coordination Telephone from 03/08/2022 in [facility] Care Coordination  SDOH Interventions        Food Insecurity Interventions -- -- Intervention Not Indicated Intervention Not Indicated Intervention Not Indicated --  Housing Interventions Intervention Not Indicated -- Intervention Not Indicated Intervention Not Indicated Intervention Not Indicated --  Transportation Interventions -- Intervention Not Indicated  Intervention Not Indicated -- Intervention Not Indicated, Patient Resources (Friends/Family), SCAT (Specialized Community Area Transporation), Payor Benefit  [LCSW informed pt and family that she has been approved for SCAT. Patient will continue utilizing [facility] Transportation prior to requesting [facility] transportation benefits] Patient Resources (Friends/Family), SCAT (Specialized Community Area Transporation), Other (Comment)  [facility] Transport ([facility])]  Utilities Interventions Intervention Not Indicated -- Intervention Not Indicated -- -- --  Alcohol Usage Interventions -- -- Intervention Not Indicated (Score <7) -- -- --  Financial Strain Interventions -- -- Intervention Not Indicated -- -- --  Physical Activity Interventions -- -- Intervention Not Indicated -- -- --  Stress Interventions -- -- Intervention Not Indicated -- -- --  Social Connections Interventions -- -- Intervention Not Indicated -- -- --        Goals Addressed             This Visit's Progress    TOC Care Plan       Current Barriers:  Chronic Disease Management support and education needs related to DMII and Stroke   RNCM Clinical Goal(s):  Patient will work with the Care Management team over the next 30 days to address Transition of Care Barriers: knowledge deficit related to DM and stroke verbalize basic understanding of  DMII and stroke disease process and self health management plan as evidenced by adherence to care plan take all medications exactly as prescribed and will call provider for medication related questions as evidenced by med compliance attend all scheduled medical appointments:   as evidenced by attending PCP and specialist appt  through collaboration with RN Care manager, provider, and care team.   Interventions: Evaluation of current treatment plan related to  self management  and patient's adherence to plan as established by provider   Diabetes Interventions:  (Status:  Goal on track:  Yes.) Short Term  Goal Assessed patient's understanding of A1c goal: <7% Provided education to patient about basic DM disease process Assessed Diabetes mgmt- spoke with sister-[name] who is assisting pt with managing Diabetes Assessed completion of follow up appts-pt completed endo appt Lab Results  Component Value Date   HGBA1C 12.2 (H) 10/12/2022    Stroke:  (Status:Goal on track: Yes) Short Term Goal Assessed pt's status and condition Assessed for participation with therapy-sister confirms HHPT, OT ,ST have been out to evaluate and work with pt Reviewed fall and safety measures with pt. Reviewed upcoming provider appts  Patient Goals/Self-Care Activities: Participate in Transition of Care Program/Attend TOC scheduled calls Take all medications as prescribed Attend all scheduled provider appointments Call provider office for new concerns or questions  check blood sugar at prescribed times: as ordered Adhere to diabetic and heart healthy diet  Follow Up Plan:  Telephone follow up appointment with care management team member scheduled for:  next week The patient has been provided with contact information for the care management team and has been advised to call with any health related questions or concerns.          Plan: The patient has been provided with contact information for the care management team and has been advised to call with any health related questions or concerns.  Follow up appt scheduled with patient/sister-for 11/02/22 @ 1:30pm  Antionette Fairy, RN,BSN,CCM RN Care Manager Transitions of Care  [place]Population [facility]  Direct Phone: [phone] Toll Free: [phone] Fax: [phone]

## 2022-10-27 NOTE — Telephone Encounter (Signed)
I left [name] a voicemail letting her know the following: - it is okay that pt declined CNA - per OV with PCP a few days ago, hydralazine wasn't re-started; pt does have upcoming appt with cardiology. - advised pt sees Endocrinology for her diabetes - Magnesium was discussed during visit. - order for speech therapy was approved.  I left a message for Corrie Dandy letting her know the verbal orders were approved.

## 2022-10-27 NOTE — Telephone Encounter (Signed)
Verbal authorization to requested services can be provided. Thanks, BJ

## 2022-10-28 ENCOUNTER — Telehealth: Payer: Self-pay | Admitting: Family Medicine

## 2022-10-28 ENCOUNTER — Telehealth: Payer: Self-pay

## 2022-10-28 NOTE — Telephone Encounter (Signed)
VO to continue PT 2x2/1x5

## 2022-10-28 NOTE — Progress Notes (Signed)
10/28/2022  Patient ID: Sarah [name], female   DOB: 17-Nov-1945, 77 y.o.   MRN: [id number]  Patient's sister [name] called in with medication concern. Was not aware hydralazine was stopped recently and reports patient has been continuing to take since discharge from the hospital.   Want to know if patient can continue hydralazine 10mg  Does not have a f/u with cardiology at this time, just remote pacer checks.   Held doses so far today after being made aware by [name] with [name] that med had been stopped, but would like to know if they can resume and wanted to make clear patient had resumed med once getting home from hospital.  [name], PharmD Clinical Pharmacist [phone]

## 2022-10-29 ENCOUNTER — Telehealth: Payer: Self-pay | Admitting: Family Medicine

## 2022-10-29 NOTE — Telephone Encounter (Signed)
I called and spoke with pt's sister, [name]. We went over the information below & she verbalized understanding. She will continue checking pt's BP daily and let us know how it is doing.

## 2022-10-29 NOTE — Telephone Encounter (Signed)
For now I do not want to change her antihypertensive medications but if it happens again, we will need to reduce doses.  Move and change positions slow to prevent falls. Thanks, Sarah [name]

## 2022-10-29 NOTE — Telephone Encounter (Signed)
Confirmed with PCP that hydralazine is stopped. Notified sister [name] who handles patient's medications while she is in town. Will remove all hydralazine from med boxes.  Counseled to notify us if any other concerns and sister voiced understanding.  [name], PharmD Clinical Pharmacist [phone]

## 2022-10-29 NOTE — Telephone Encounter (Addendum)
Onalee Hua, PT with [name]  [phone]  Reporting BP Concern:  BP normal at 122/60, before PT At standing: 80/46 Pt reports dizziness Pt at risk for falls  Please F/U with Pt

## 2022-10-29 NOTE — Telephone Encounter (Signed)
I left Sarah [name] a voicemail letting him know the verbal is approved.

## 2022-11-02 ENCOUNTER — Ambulatory Visit: Payer: Self-pay

## 2022-11-02 NOTE — Patient Outreach (Signed)
Care Management  Transitions of Care Program Transitions of Care Post-discharge week 3  11/02/2022 Name: Sarah [name] MRN: [id number] DOB: 1945/09/14  Subjective: Sarah [name] is a 77 y.o. year old female who is a primary care patient of [place], Timoteo Expose, MD. The Care Management team was unable to reach the patient by phone to assess and address transitions of care needs.   Plan: Additional outreach attempts will be made to reach the patient enrolled in the [facility] Program (Post Inpatient/ED Visit).     Antionette Fairy, RN,BSN,CCM RN Care Manager Transitions of Care  [place]  Direct Phone: [phone] Toll Free: [phone] Fax: [phone]

## 2022-11-03 ENCOUNTER — Telehealth: Payer: Self-pay

## 2022-11-03 NOTE — Patient Outreach (Signed)
Care Management  Transitions of Care Program Transitions of Care Post-discharge week 3  11/03/2022 Name: [name] MRN: [id number] DOB: [birth year]  Subjective: Sarah [name] is a 77 y.o. year old female who is a primary care patient of [place], Timoteo Expose, MD. The Care Management team was unable to reach the patient by phone to assess and address transitions of care needs.   Plan: Additional outreach attempts will be made to reach the patient enrolled in the [facility] Program (Post Inpatient/ED Visit).    Antionette Fairy, RN,BSN,CCM RN Care Manager Transitions of Care  [place]  Direct Phone: [phone] Toll Free: [phone] Fax: [phone]

## 2022-11-04 ENCOUNTER — Telehealth: Payer: Self-pay

## 2022-11-04 NOTE — Patient Outreach (Signed)
Care Management  Transitions of Care Program Transitions of Care Post-discharge week 3  11/04/2022 Name: Sarah [name] MRN: [id number] DOB: 06-28-1945  Subjective: Sarah [name] is a 77 y.o. year old female who is a primary care patient of [place], Timoteo Expose, MD. The Care Management team was unable to reach the patient by phone to assess and address transitions of care needs.   Plan: No further outreach attempts will be made at this time.  We have been unable to reach the patient.    Antionette Fairy, RN,BSN,CCM RN Care Manager Transitions of Care  [place]  Direct Phone: [phone] Toll Free: [phone] Fax: [phone]

## 2022-11-11 ENCOUNTER — Ambulatory Visit (INDEPENDENT_AMBULATORY_CARE_PROVIDER_SITE_OTHER): Payer: Medicare Other

## 2022-11-11 DIAGNOSIS — I6340 Cerebral infarction due to embolism of unspecified cerebral artery: Secondary | ICD-10-CM

## 2022-11-12 LAB — CUP PACEART REMOTE DEVICE CHECK
Date Time Interrogation Session: 20241031163909
Implantable Pulse Generator Implant Date: 20240618

## 2022-11-15 NOTE — Progress Notes (Unsigned)
[facility]  PATIENT: Sarah [name] DOB: 1945/08/25    REASON FOR VISIT: Follow-up for stroke right MCA 10/2016 readmitted 02/08/2018 for right parieto-occipital stroke  HISTORY FROM: Patient, sister and chart  Chief complaint: No chief complaint on file.     HPI:  Update 11/17/2022 JM: Patient is being seen for sooner scheduled visit due to recent stroke.  She presented to ED on 10/11/2022 due to new onset right sided leg weakness with fall.  Stroke workup revealed several small infarcts at left brain watershed areas and left cerebellum, etiology concerning for cardioembolic source.  CTA showed moderate to advanced bilateral MCA stenosis.  2D echo showed hyperechoic mass on left coronary cusp, TEE showed nodular density left coronary cusp (possibly nodular calcification) and mobile density right coronary cusp (possible vegetation vs lambl's excrescence).  On Plavix PTA with noted noncompliance, recommended aspirin and Brilinta for 1 month then switch to aspirin and Plavix, if recurrent stroke while on DAPT, recommended consideration of anticoagulation, recommended outpatient cardiology follow-up.  New ILR placed 06/2022, interrogated without evidence of A-fib.  LDL 44, A1c 12.2. Continued home dose Crestor and Zetia.  Recommended close PCP follow-up for better DM control.  Therapies recommended home health PT/OT.     Has since had follow-up with endocrinology who noted higher A1c due to medication nonadherence, adjustments to antidiabetic regimen made, Follow-up in February. Has not yet had follow-up with cardiology ***     History provided for reference purposes only Update 09/07/2022 JM: Returns for follow-up visit unaccompanied.  Residual gait and balance impairment and at times unsteadiness, some improvement since prior visit. Completed PT last week, noted some benefit. Continues to do HEP.  Ambulates with cane, denies any recent falls.  Denies new stroke/TIA  symptoms.  At prior visit, complained of daily headaches and initiated topiramate 25mg  nightly, has not had any recent headaches, tolerating topamax well.  Also evaluated by Dr. [name] for possible underlying sleep apnea, sleep study scheduled 9/22.   Reports compliance on Plavix, atorvastatin and Zetia.  Routinely follows with PCP and endocrinology for stroke risk factor management.  Continues to have decreased vision in left eye, routinely followed by ophthalmology, was told she had blood behind her eye, plans to f/u towards the end of this year.   Update 03/09/2022 JM: Overall stable from stroke standpoint without any new or worsening stroke/TIA symptoms.  Reports residual gait difficulty and imbalance, has improved some since prior visit, has graduated from walker to cane. Still feels unsteady at times when ambulating. Working with therapy at home. Denies any recent falls.   Continues to experience headaches, have been every other day if not daily. Can occur mid afternoon typically, located frontal and behind eyes. Questions if due to her vision, can feel eye fatigue/strain after reading for prolonged period of time or watching television, has not noticed worsening of headaches around this time. Has f/u with eye doctor in April. Denies taking OTC medications to help with headaches.  She also continues to complain of daytime fatigue.  Compliant on Plavix, atorvastatin and Zetia - currently prescribed 2x 40mg  atorvastatin tablets (due to difficulty swallowing 80mg  tablet) but believes only take 40mg  per day Blood pressure typically well controlled, slightly on higher side today, monitored by [facility] therapy and has been stable Routinely follows with PCP and endocrinology Cardiac monitor negative for atrial fibrillation  Update 11/04/2021 JM: Patient returns for recent hospitalization.  She is accompanied by her sister. She presented to ED on 10/13  with acute encephalopathy with memory lapses and feeling  unsteady/wobbly with walking and found to have punctate left cerebellar and left parietal lobe infarctions likely incidental finding on MRI during encephalopathy work-up, etiology unclear possibly cardioembolic pattern.  MRA negative LVO, bilateral M1 segment stenosis.  Carotid Doppler unremarkable.  EEG normal.  EF 60 to 65%.  Recommend 30-day event monitor to evaluate for A-fib, previously had loop recorder without evidence of A-fib.  LDL 227.  A1c 12.6.  Recommended DAPT for 3 weeks then Plavix alone and as well as continuation of home dose Zetia and atorvastatin and recommended follow-up outpatient with lipid clinic for further lipid management.  Also advised outpatient PCP follow-up for better DM control.  She was evaluated by therapies, initially recommended CIR but due to improvement during hospitalization, recommendations changed to outpatient therapy and 24/7 care/supervision.   Reports since discharge, she has been experiencing generalized weakness sensation with increased fatigue and awakening about 3 times per week with left-sided headaches, these usually get better throughout the day, will use Tylenol if needed with benefit (denies overuse).  She also reports some visual changes in her left eye, has appt with ophthalmologist Dr. Clelia Croft on Monday.  She denies any specific weakness or speech difficulty.  Does have some gait impairment and currently ambulating with RW, denies any recent falls.  She is living at home but family has been providing 24/7 care/assistance.  She is scheduled to start outpatient therapies tomorrow.  Denies any new stroke/TIA symptoms.  She remains on aspirin and Plavix as well as atorvastatin and Zetia, denies side effects -family has been assisting with ensuring medication compliance Blood pressure today 141/72 She has since been seen by PCP and endocrinology Has follow-up visit with cardiology 11/28 - reports she has not heard anything in regards to the cardiac  monitor   Update 09/11/2020 JM:, returns for requested visit due to c/o worsening balance over the past 2 months and gradual memory decline. Worsening knee pain after a near fall in June - seen by ortho for left knee arthritis and bursitis. Limiting ambulation due to pain.  She is able to ambulate without assistive device.  Mild decline of memory reported but able to maintain ADLs and IADLs independently.  Sedentary lifestyle without any type of memory or cognitive exercises.  Denies any specific weakness, numbness/tingling, speech difficulty or confusion.  Currently on aspirin, plavix, zetia and atorvastatin for secondary stroke prevention and hx of CAD. Blood pressure today 143/72.  Glucose levels routinely monitored which has been stable.  No further concerns at this time.  Update 06/21/2019:, Ms. Winters is being seen for stroke follow-up unaccompanied.  She has been stable from a stroke standpoint without new or worsening stroke/TIA symptoms.  Prior complaints of worsening imbalance, left arm numbness/tingling and speech difficulty with MRI and MRA stable without acute abnormalities.  She reports ongoing improvement and continues to do HEP as recommended by therapy at home.  EMG/NCV completed for left arm numbness/tingling with mild bilateral carpal tunnel syndrome but otherwise unremarkable.  Reports left arm symptoms resolved as well as left-sided neck symptoms.  Continues on clopidogrel and atorvastatin for secondary stroke prevention.  Recently added Zetia by PCP due to recent lipid panel showing LDL 115.  Blood pressure today 146/72.  Recent A1c 12.3 with recent adjustments of insulin regimen and continuation of Metformin and Victoza by PCP.  Continues to follow routinely with PCP for HTN, HLD and DM management.  Loop recorder has not shown atrial fibrillation thus  far.  No concerns at this time.  Update 02/13/2019: Ms. Chandra is a 77 year old female who is being seen today for stroke follow-up.   She has been stable from a stroke standpoint with residual mild stable left homonymous hemianopsia, mild imbalance which she believes has been slightly worsening over the past couple of months along with possible worsening of prior aphasia from stroke in 2018.  She does admit to limited physical activity and does not have an established exercise regimen along with endorsing increased stress currently caring for her mother with increased illness and increased stress due to overall COVID-19 pandemic.  She has recently signed up for [employer] program which will be starting in March.  She also states for the past 2 weeks, she has been experiencing left arm numbness/tingling but will resolve quickly and symptoms intermittent.  She does endorse left-sided neck stiffness that radiates into her upper trapezius and increased pain when looking towards the left.  Denies any increased weakness.  Unable to determine if numbness/tingling symptoms occur in certain positions.  She has not previously experienced this sensation.  Denies any other new or worsening neurological symptoms.  She did have bilateral cataract surgery in 07/2018 without complication. Continues on Plavix and atorvastatin for secondary stroke prevention without side effects.  Blood pressure today 130/60.  Continues to follow with PCP for DM management with recent A1c 9.7.  Loop recorder is not shown atrial fibrillation thus far.  Monitoring/battery life will be completed 10/2019.  She has received her first dose of Covid vaccination and plans on receiving second dose next week.  Denies new or worsening stroke/TIA symptoms.  No further concerns at this time.  06/27/2018 VIRTUAL VISIT: Ms. Yerby is being seen today for three-month follow-up visit regarding recent stroke.  Initially scheduled for office visit follow-up but due to COVID-19 safety precautions, visit transition to telemedicine via MyChart with patient's consent.  She has been stable from a  stroke standpoint with residual left homonymous hemianopsia and balance difficulties.  She continues to participate in outpatient PT with ongoing improvement.  Continues on Plavix and atorvastatin for secondary stroke prevention without reported side effects. Blood pressure monitored at home.  Lab work obtained yesterday with A1c 10.3 (prior 13.5).  She continues to follow with PCP for ongoing management.  Loop recorder has not shown atrial fibrillation thus far.  No further concerns at this time.  Denies new or worsening stroke/TIA symptoms.   Hospital follow-up 03/23/2018 CM: Ms. Blanks, 77 year old female returns for follow-up with readmission for stroke 1/29/ 2020.  MRI of the brain acute cortical and subcortical RPO infarcts.  She initially presented with left hemianopsia.  She is now on Plavix and aspirin for 3 months and then Plavix alone.  She has not had further stroke or TIA symptoms.  She has minimal bruising and no bleeding.  Hemoglobin A1c 13.5 on hospital admission.  She claims she is trying to get that down to a more reasonable level.  She remains on Lipitor without myalgias.  Blood pressure in the office today 146/87.  She continues to go to [facility].  She is to start rehab physical therapy and occupational therapy today.  Loop recorder so far no atrial fibrillation.  She is not driving due to her vision.  She returns for reevaluation  Hospital summary: right parieto-occipital infarcts in setting of progressive R MCA stenosis, infarcts secondary to large vessel disease source Resultant L hemianopia  CT head R parietal watershed infarct. Small vessel  disease.  MRI  Acute cortical and subcortical R P-O infarcts. Old R CR and R frontal lobe infarcts. Small vessel disease.  MRA head  No LVO. Focal stenosis R M1, R M2/M3, L M1 MRA neck B VA origin atherosclerosis 30-50% 2D Echo  pending  Loop interrogation - last check 1/3 w/o AF - interrogation today without AF LDL 76 HgbA1c  13.5 Lovenox 40 mg sq daily for VTE prophylaxis clopidogrel 75 mg daily prior to admission, now on aspirin 325 mg daily and clopidogrel 75 mg daily. Continue DAPT x 3 months then plavix alone Therapy recommendations:  OP OT and PT Disposition:  Home Patient advised not to drive      REVIEW OF SYSTEMS: Full 14 system review of systems performed and notable only for those listed, all others are neg:  See HPI  ALLERGIES: Allergies  Allergen Reactions   Penicillins Other (See Comments)    Does not know reaction    HOME MEDICATIONS:   PAST MEDICAL HISTORY: Past Medical History:  Diagnosis Date   Bilateral carpal tunnel syndrome 03/27/2019   Boils    Glaucoma    Heart murmur    HTN (hypertension)    Hx of adenomatous colonic polyps    Hyperlipidemia    Osteoporosis    Pneumonia    Stroke (HCC)    Type II or unspecified type diabetes mellitus without mention of complication, uncontrolled     PAST SURGICAL HISTORY: Past Surgical History:  Procedure Laterality Date   ABDOMINAL HYSTERECTOMY     CARPAL TUNNEL RELEASE     right   COLONOSCOPY  12-10-10   per Dr. Arlyce Dice, clear, repeat in 7 yrs    CORONARY STENT INTERVENTION N/A 03/11/2020   Procedure: CORONARY STENT INTERVENTION;  Surgeon: Corky Crafts, MD;  Location: [facility] INVASIVE CV LAB;  Service: Cardiovascular;  Laterality: N/A;   CORONARY ULTRASOUND/IVUS N/A 03/11/2020   Procedure: Intravascular Ultrasound/IVUS;  Surgeon: Corky Crafts, MD;  Location: [facility] INVASIVE CV LAB;  Service: Cardiovascular;  Laterality: N/A;   INCISION AND DRAINAGE PERIRECTAL ABSCESS N/A 09/26/2015   Procedure: IRRIGATION AND DEBRIDEMENT PERIRECTAL ABSCESS;  Surgeon: Rodman Pickle, MD;  Location: [facility] OR;  Service: General;  Laterality: N/A;   KNEE ARTHROSCOPY     right   LEFT HEART CATH AND CORONARY ANGIOGRAPHY N/A 03/11/2020   Procedure: LEFT HEART CATH AND CORONARY ANGIOGRAPHY;  Surgeon: Corky Crafts, MD;  Location: [facility]  INVASIVE CV LAB;  Service: Cardiovascular;  Laterality: N/A;   LOOP RECORDER INSERTION N/A 10/19/2016   Procedure: LOOP RECORDER INSERTION;  Surgeon: Hillis Range, MD;  Location: [facility] INVASIVE CV LAB;  Service: Cardiovascular;  Laterality: N/A;   ORIF ANKLE FRACTURE Right 03/24/2012   Procedure: OPEN REDUCTION INTERNAL FIXATION (ORIF) ANKLE FRACTURE;  Surgeon: Nadara Mustard, MD;  Location: [facility] OR;  Service: Orthopedics;  Laterality: Right;  Open Reduction Internal Fixation Right Bimalleolar ankle fracture   POLYPECTOMY     TEE WITHOUT CARDIOVERSION N/A 10/18/2016   Procedure: TRANSESOPHAGEAL ECHOCARDIOGRAM (TEE);  Surgeon: Pricilla Riffle, MD;  Location: [facility] ENDOSCOPY;  Service: Cardiovascular;  Laterality: N/A;   TEE WITHOUT CARDIOVERSION N/A 10/14/2022   Procedure: TRANSESOPHAGEAL ECHOCARDIOGRAM;  Surgeon: Lewayne Bunting, MD;  Location: [facility] INVASIVE CV LAB;  Service: Cardiovascular;  Laterality: N/A;   TONSILLECTOMY      FAMILY HISTORY: Family History  Problem Relation Age of Onset   Diabetes Mother    Hypertension Mother    Stroke Father    Stroke  Maternal Uncle    Stroke Paternal Uncle    Colon cancer Neg Hx    Esophageal cancer Neg Hx    Rectal cancer Neg Hx    Stomach cancer Neg Hx    Inflammatory bowel disease Neg Hx    Liver disease Neg Hx    Pancreatic cancer Neg Hx     SOCIAL HISTORY: Social History   Socioeconomic History   Marital status: Single    Spouse name: Not on file   Number of children: Not on file   Years of education: Not on file   Highest education level: Not on file  Occupational History   Not on file  Tobacco Use   Smoking status: Former    Current packs/day: 0.00    Types: Cigarettes    Quit date: 10/15/2016    Years since quitting: 6.0   Smokeless tobacco: Never   Tobacco comments:    smokes occ.   Vaping Use   Vaping status: Never Used  Substance and Sexual Activity   Alcohol use: Yes    Alcohol/week: 0.0 standard drinks of alcohol     Comment: occ   Drug use: No   Sexual activity: Not on file  Other Topics Concern   Not on file  Social History Narrative   Caffiene coffee 1 cup in am.   Retired from [employer].     Social Determinants of Health   Financial Resource Strain: Low Risk  (05/03/2022)   Overall Financial Resource Strain (CARDIA)    Difficulty of Paying Living Expenses: Not hard at all  Food Insecurity: No Food Insecurity (10/18/2022)   Hunger Vital Sign    Worried About Running Out of Food in the Last Year: Never true    Ran Out of Food in the Last Year: Never true  Transportation Needs: No Transportation Needs (10/18/2022)   PRAPARE - Administrator, Civil Service (Medical): No    Lack of Transportation (Non-Medical): No  Physical Activity: Insufficiently Active (05/03/2022)   Exercise Vital Sign    Days of Exercise per Week: 2 days    Minutes of Exercise per Session: 10 min  Stress: No Stress Concern Present (05/03/2022)   [employer] of Occupational Health - Occupational Stress Questionnaire    Feeling of Stress : Not at all  Social Connections: Moderately Integrated (05/03/2022)   Social Connection and Isolation Panel [NHANES]    Frequency of Communication with Friends and Family: More than three times a week    Frequency of Social Gatherings with Friends and Family: More than three times a week    Attends Religious Services: More than 4 times per year    Active Member of [employer] or Organizations: Yes    Attends Banker Meetings: More than 4 times per year    Marital Status: Widowed  Intimate Partner Violence: Not At Risk (10/12/2022)   Humiliation, Afraid, Rape, and Kick questionnaire    Fear of Current or Ex-Partner: No    Emotionally Abused: No    Physically Abused: No    Sexually Abused: No     PHYSICAL EXAM  There were no vitals filed for this visit.  There is no height or weight on file to calculate BMI.   General: well developed, well nourished,  very pleasant elderly African-American female, seated, in no evident distress Head: head normocephalic and atraumatic.   Neck: supple with no carotid or supraclavicular bruits Cardiovascular: regular rate and rhythm, no murmurs Musculoskeletal: no deformity  Skin:  no rash/petichiae Vascular:  Normal pulses all extremities   Neurologic Exam Mental Status: Awake and fully alert. Occasional stuttering of speech/speech hesitancy (chronic). Oriented to place and time. Recent memory subjectively impaired and remote memory intact. Attention span, concentration and fund of knowledge appropriate during visit. Mood and affect appropriate.  Cranial Nerves: Pupils equal, briskly reactive to light. Extraocular movements full without nystagmus. Visual fields full to confrontation testing. Hearing intact. Facial sensation intact. Face, tongue, palate moves normally and symmetrically.  Motor: Normal bulk and tone. Normal strength in all tested extremity muscles. Sensory.: intact to touch , pinprick , position and vibratory sensation.  Coordination: Rapid alternating movements normal in all extremities except slightly decreased left hand dexterity (chronic from prior stroke). Finger-to-nose and heel-to-shin performed accurately bilaterally. Gait and Station: Arises from chair without difficulty. Stance is normal. Gait demonstrates decreased stride length and step height L>R with mild imbalance and use of cane.  Tandem walk and heel toe not attempted. Reflexes: 1+ and symmetric. Toes downgoing.      DIAGNOSTIC DATA (LABS, IMAGING, TESTING) - I reviewed patient records, labs, notes, testing and imaging myself where available.  Lab Results  Component Value Date   WBC 7.5 10/14/2022   HGB 10.8 (L) 10/14/2022   HCT 32.4 (L) 10/14/2022   MCV 94.5 10/14/2022   PLT 288 10/14/2022      Component Value Date/Time   NA 139 10/15/2022 0711   NA 144 12/08/2021 1351   K 3.5 10/15/2022 0711   CL 109 10/15/2022  0711   CO2 21 (L) 10/15/2022 0711   GLUCOSE 134 (H) 10/15/2022 0711   BUN 13 10/15/2022 0711   BUN 15 12/08/2021 1351   CREATININE 0.95 10/15/2022 0711   CREATININE 0.55 (L) 10/24/2019 1602   CALCIUM 8.8 (L) 10/15/2022 0711   PROT 4.9 (L) 10/14/2022 0723   PROT 6.2 09/22/2020 0958   ALBUMIN 1.8 (L) 10/14/2022 0723   ALBUMIN 3.5 (L) 09/22/2020 0958   AST 40 10/14/2022 0723   ALT 34 10/14/2022 0723   ALKPHOS 61 10/14/2022 0723   BILITOT 0.3 10/14/2022 0723   BILITOT <0.2 09/22/2020 0958   GFRNONAA >60 10/15/2022 0711   GFRNONAA 94 10/24/2019 1602   GFRAA 103 03/04/2020 1025   GFRAA 109 10/24/2019 1602   Lab Results  Component Value Date   CHOL 139 10/12/2022   HDL 79 10/12/2022   LDLCALC 44 10/12/2022   TRIG 79 10/12/2022   CHOLHDL 1.8 10/12/2022   Lab Results  Component Value Date   HGBA1C 12.2 (H) 10/12/2022        ASSESSMENT AND PLAN    Ms. LYNDON CHENOWETH is a 77 y.o. female with history of recent several small infarcts at left brain watershed areas and left cerebellum in 10/2022 concerning for cardioembolic source, and hx of punctate left cerebellar and left parietal lobe infarcts in 10/2021 likely incidental finding on MRI during encephalopathy work-up of unclear etiology, right parieto-occipital infarcts in setting of progressive right MCA stenosis in 2020 likely secondary to large vessel disease source and right MCA stroke in 2018 s/p ILR without evidence of A-fib during 4 year monitoring duration.     History of multiple strokes -advised use of cane at all times for chronic gait impairment with imbalance and unsteadiness, continue HEP as advised by therapies  -s/p new ILR 06/2022, no evidence of A-fib thus far -continue plavix, lipitor and Zetia for secondary stroke prevention.  -Cardiac monitor negative for atrial fibrillation -Continue close  PCP, endocrinology and cardiology follow-up for aggressive stroke risk factor management maintain blood pressure  goal <130/80, diabetes with hemoglobin A1c goal below 7.0% and lipids with LDL cholesterol goal below 70 mg/dL.  -LDL 44 (10/2022) -A1c 12.2 (10/2022)  Cardiac mass -2D echo hyperechoic mass on left coronary cusp -TEE nodular density left coronary cusp (possible nodular calcification), mobile density right coronary cusp (possible vegetation vs lambl's excrescence)  -Completed 1 month of aspirin Brilinta, now back to Plavix alone -Due to follow-up with cardiology    Headaches (since 10/2021) Excessive daytime fatigue At risk for sleep apnea -Improvement of headaches, continue topiramate 25 mg nightly -refill provided -eval by Dr. [name] 03/2022 - Sleep study scheduled 10/03/2022     Follow-up in 6 months or call earlier if needed     I spent 25 minutes of face-to-face and non-face-to-face time with patient.  This included previsit chart review, lab review, study review, order entry, electronic health record documentation, patient education and discussion regarding above diagnoses and treatment plan and answered all other questions to patient's satisfaction   Ihor Austin, [facility]  [facility] [address] [place], [place] [address]  Phone [phone] Fax [phone] Note: This document was prepared with digital dictation and possible smart phrase technology. Any transcriptional errors that result from this process are unintentional.

## 2022-11-17 ENCOUNTER — Encounter: Payer: Self-pay | Admitting: Adult Health

## 2022-11-17 ENCOUNTER — Ambulatory Visit (INDEPENDENT_AMBULATORY_CARE_PROVIDER_SITE_OTHER): Payer: Medicare Other | Admitting: Adult Health

## 2022-11-17 VITALS — BP 111/59 | HR 96 | Ht 60.0 in | Wt 101.8 lb

## 2022-11-17 DIAGNOSIS — Z8673 Personal history of transient ischemic attack (TIA), and cerebral infarction without residual deficits: Secondary | ICD-10-CM

## 2022-11-17 DIAGNOSIS — G44221 Chronic tension-type headache, intractable: Secondary | ICD-10-CM | POA: Diagnosis not present

## 2022-11-17 MED ORDER — TOPIRAMATE 50 MG PO TABS: 50.0000 mg | ORAL_TABLET | Freq: Every evening | ORAL | 5 refills | Status: DC

## 2022-11-17 NOTE — Patient Instructions (Addendum)
Continue working with home health therapies for hopeful ongoing recovery  Increase topamax to 50mg  nightly for headache prevention - can take 2 25mg  tablets for now but a new prescription has been sent to your pharmacy with 50mg  tablets.  Please schedule follow-up visit with your cardiologist Dr. Izora Ribas for hospital follow-up - office number [phone]  Loop recorder has not shown atrial fibrillation thus far - routinely monitored by cardiology  Continue aspirin 81 mg daily and clopidogrel 75 mg daily  and Crestor and Zetia for secondary stroke prevention managed/prescribed by PCP  Continue to follow up with PCP regarding blood pressure, cholesterol and diabetes management  Maintain strict control of hypertension with blood pressure goal below 130/90, diabetes with hemoglobin A1c goal below 7.0 % and cholesterol with LDL cholesterol (bad cholesterol) goal below 70 mg/dL.   Signs of a Stroke? Follow the BEFAST method:  Balance Watch for a sudden loss of balance, trouble with coordination or vertigo Eyes Is there a sudden loss of vision in one or both eyes? Or double vision?  Face: Ask the person to smile. Does one side of the face droop or is it numb?  Arms: Ask the person to raise both arms. Does one arm drift downward? Is there weakness or numbness of a leg? Speech: Ask the person to repeat a simple phrase. Does the speech sound slurred/strange? Is the person confused ? Time: If you observe any of these signs, call 911.      Follow-up in March as scheduled       Thank you for coming to see [place] at [facility]. I hope we have been able to provide you high quality care today.  You may receive a patient satisfaction survey over the next few weeks. We would appreciate your feedback and comments so that we may continue to improve ourselves and the health of our patients.

## 2022-11-22 ENCOUNTER — Ambulatory Visit: Payer: Medicare Other | Admitting: Cardiology

## 2022-11-24 NOTE — Progress Notes (Signed)
[facility] Summary Report / Loop Recorder 

## 2022-12-12 ENCOUNTER — Other Ambulatory Visit: Payer: Self-pay | Admitting: Family Medicine

## 2022-12-12 DIAGNOSIS — F419 Anxiety disorder, unspecified: Secondary | ICD-10-CM

## 2022-12-14 ENCOUNTER — Ambulatory Visit (INDEPENDENT_AMBULATORY_CARE_PROVIDER_SITE_OTHER): Payer: Medicare Other

## 2022-12-14 DIAGNOSIS — I6340 Cerebral infarction due to embolism of unspecified cerebral artery: Secondary | ICD-10-CM

## 2022-12-16 LAB — CUP PACEART REMOTE DEVICE CHECK
Date Time Interrogation Session: 20241203163913
Implantable Pulse Generator Implant Date: 20240618

## 2023-01-17 ENCOUNTER — Ambulatory Visit: Payer: Medicare Other

## 2023-01-17 DIAGNOSIS — I6340 Cerebral infarction due to embolism of unspecified cerebral artery: Secondary | ICD-10-CM

## 2023-01-17 LAB — CUP PACEART REMOTE DEVICE CHECK
Date Time Interrogation Session: 20250105231821
Implantable Pulse Generator Implant Date: 20240618

## 2023-02-09 NOTE — Progress Notes (Signed)
 Cardiology Office Note:  .   Date:  02/21/2023  ID:  Sarah [name], DOB Jan 07, 1946, MRN [id number] PCP: [place], [name], MD  [facility] Providers Cardiologist:  Jann Melody, MD Cardiology APP:  Flo Hummingbird, PA-C    History of Present Illness: .   Sarah [name] is a 78 y.o. female  with a history of CAD status post DES to the RCA 03/30/2020, CVA x 2 status post loop recorder with no detected arrhythmias thus far.  She also has a history of carotid artery stenosis, hypertension, hyperlipidemia, type 2 diabetes mellitus.   Initial stroke was in 2018 and then was admitted 10/30/2021 for an acute CVA.  EF at that time was 60 to 65% with moderate LVH and mild MR.  Carotid Doppler showed 1 to 39% bilateral carotid artery stenosis.  She already had a loop recorder placed for stroke in the past but had run out of battery and outpatient monitor was ordered which showed short runs of nonsustained VT but no A-fib or flutter.  She subsequently underwent extraction of loop recorder and implantation of a new one in June 2024.  presented 10/11/2022 with generalized weakness and subsequent fall with noticeable right arm weakness.  MRI showed small acute infarcts scattered throughout the left cerebral hemisphere and cerebellum with extensive chronic small vessel disease and right cerebral cortex infarcts.  There was also advanced MCA stenosis but no flow reducing stenosis or embolic source in the neck.  Repeat echo showed EF 65 to 70% with hyperechoic mass in the left coronary cusp that was slightly mobile.  TEE demonstrated nodular density on the left coronary cusp consistent with nodular calcification with possible Lambl excrescences.  Blood cultures have been negative.  She had stopped her plavix  on her own.If was not felt that she had endocarditis and treatment with DAPT with aspirin  and Brilinta  for 1 month and then recommend aspirin  81 mg and Plavix  75 mg daily thereafter  long-term.  Patient comes in with her daughter from [place] . She has really slowed down since November. She did rehab but thinks she needs more PT. Denies chest pain, palpitations, dyspnea. Had remote ILR this am but not read yet. No afib in Dec.    ROS:    Studies Reviewed: Aaron Aas         Prior CV Studies:     TTE 10/13/2022: Impressions:     1. Left ventricular ejection fraction, by estimation, is 65 to 70%. The  left ventricle has normal function. The left ventricle has no regional  wall motion abnormalities. There is mild concentric left ventricular  hypertrophy. Left ventricular diastolic  parameters are consistent with Grade I diastolic dysfunction (impaired  relaxation).   2. Right ventricular systolic function is normal. The right ventricular  size is normal.   3. Left atrial size was mildly dilated.   4. The mitral valve is normal in structure. Trivial mitral valve  regurgitation. No evidence of mitral stenosis.   5. There is a hyperechoic mass on the left coronary cusp measuring 4 x 7  mm. It has a very short and fairly broad attachment to the leaflet and is  only slightly mobile. Diffierential diagnosis is broad and includes healed  vegetation, atypical myxoma,  papillary fibroelastoma and even degenerative change-related nodule. The  aortic valve is tricuspid. There is mild thickening of the aortic valve.  Aortic valve regurgitation is trivial. Aortic valve  sclerosis/calcification is present, without any evidence   of aortic  stenosis.   6. The inferior vena cava is normal in size with greater than 50%  respiratory variability, suggesting right atrial pressure of 3 mmHg.  _______________   TEE 10/14/2022: Impressions:  1. Nodular density noted on left coronary cusp (possible nodular  calcification); mobile density on right coronary cusp (vegetation vs  lambl's excrescenc; less likely fibroelastoma).   2. Left ventricular ejection fraction, by estimation, is 60 to  65%. The  left ventricle has normal function. There is moderate concentric left  ventricular hypertrophy.   3. Right ventricular systolic function is normal. The right ventricular  size is normal.   4. No left atrial/left atrial appendage thrombus was detected. The LAA  emptying velocity was 77 cm/s.   5. The mitral valve is normal in structure. Trivial mitral valve  regurgitation. No evidence of mitral stenosis.   6. Mobile echodensity present right coronary cusp; nodular density  present left coronary cusp.. The aortic valve is abnormal. Aortic valve  regurgitation is trivial. No aortic stenosis is present.   7. The pulmonic valve was abnormal.   8. There is Moderate (Grade III) plaque involving the descending aorta.   9. Agitated saline contrast bubble study was negative, with no evidence  of any interatrial shunt.      Risk Assessment/Calculations:             Physical Exam:   VS:  BP 138/60   Pulse 75   Resp 16   Ht 5' (1.524 m)   Wt 108 lb 6.4 oz (49.2 kg)   SpO2 99%   BMI 21.17 kg/m    Wt Readings from Last 3 Encounters:  02/21/23 108 lb 6.4 oz (49.2 kg)  11/17/22 101 lb 12.8 oz (46.2 kg)  10/25/22 109 lb (49.4 kg)    GEN: Well nourished, well developed in no acute distress NECK: No JVD; No carotid bruits CARDIAC:  RRR, no murmurs, rubs, gallops RESPIRATORY:  Clear to auscultation without rales, wheezing or rhonchi  ABDOMEN: Soft, non-tender, non-distended EXTREMITIES:  No edema; No deformity   ASSESSMENT AND PLAN: .    Recurrent CVA Abnormal Echo  Patient has a history of recurrent CVA (2018 and 10/2021) who presented with another CVA. Brain MRI showed small acute infarcts scattered in the left cerebral hemisphere and left cerebellum as well as extensive chronic small vessel disease and right cerebral cortex infarcts. TTE showed TTE showed LVEF of 65-70% with normal wall motion, mild LVH, and grade 1 diastolic dysfunction as well as a hyperechoic mass on the  left coronary cusp measuring 4x37mm that had a very short and fairly broad attachment to the leaflet and was only slightly mobile. TEE was done for further evaluation which showed  nodular density on the left coronary cusp (possible nodular calcification) as well as a mobile density on the right coronary cusp. Differential includes vegetation vs lambl's excrescence (fibroelastoma was felt to be less likely). Blood cultures  negative. Loop recorder was interrogated and was negative for atrial fibrillation. Reviewed TEE images with Dr. Micael Adas - the lambl's excrescence are very small. She has multiple other possible sources for her CVA including carotid stenosis and aortic atherosclerosis. Patient was supposed to be on Plavix  monotherapy as an outpatient but was not taking this. Neurology's note mention DAPT with Aspirin  and Brilinta  for 1 month and then transition back to Plavix  monotherapy. Would recommend indefinite DAPT so after 1 month of Aspirin  and Brilinta  can switch back to Aspirin  and Plavix . If she has a recurrent  CVA while on DAPT, then may need to consider anticoagulation instead.   CAD S/p DES to RCA in 03/2020.  - No chest pain. - Continue antiplatelet therapy as above.  - Continue statin.    Hypertension BP much better controlled - Home medications: Amlodipine  5mg  daily, Losartan  50mg  daily, Lasix  20mg  prn -check CBC and BMET     Hyperlipidemia Lipid panel  10/2022: Total Cholesterol 139, Triglycerides 79, HDL 79, LDL 44.  - Continue Crestor  20mg  daily and Zetia  10mg  daily.    Type 2 Diabetes Mellitus  - Management per PCP            Dispo: f/u in 3-4 months.  Signed, Theotis Flake, PA-C

## 2023-02-21 ENCOUNTER — Ambulatory Visit (INDEPENDENT_AMBULATORY_CARE_PROVIDER_SITE_OTHER): Payer: Medicare Other

## 2023-02-21 ENCOUNTER — Ambulatory Visit: Payer: Medicare Other | Attending: Physician Assistant | Admitting: Physician Assistant

## 2023-02-21 ENCOUNTER — Encounter: Payer: Self-pay | Admitting: Physician Assistant

## 2023-02-21 VITALS — BP 138/60 | HR 75 | Resp 16 | Ht 60.0 in | Wt 108.4 lb

## 2023-02-21 DIAGNOSIS — I10 Essential (primary) hypertension: Secondary | ICD-10-CM | POA: Diagnosis not present

## 2023-02-21 DIAGNOSIS — I6340 Cerebral infarction due to embolism of unspecified cerebral artery: Secondary | ICD-10-CM | POA: Diagnosis not present

## 2023-02-21 DIAGNOSIS — I25118 Atherosclerotic heart disease of native coronary artery with other forms of angina pectoris: Secondary | ICD-10-CM | POA: Diagnosis not present

## 2023-02-21 DIAGNOSIS — E782 Mixed hyperlipidemia: Secondary | ICD-10-CM

## 2023-02-21 DIAGNOSIS — E119 Type 2 diabetes mellitus without complications: Secondary | ICD-10-CM

## 2023-02-21 NOTE — Patient Instructions (Addendum)
 Medication Instructions:  Your physician recommends that you continue on your current medications as directed. Please refer to the Current Medication list given to you today.  *If you need a refill on your cardiac medications before your next appointment, please call your pharmacy*   Lab Work: TODAY:  BMET & CBC  If you have labs (blood work) drawn today and your tests are completely normal, you will receive your results only by: MyChart Message (if you have MyChart) OR A paper copy in the mail If you have any lab test that is abnormal or we need to change your treatment, we will call you to review the results.   Testing/Procedures: None ordered   Follow-Up: At [facility], you and your health needs are our priority.  As part of our continuing mission to provide you with exceptional heart care, we have created designated Provider Care Teams.  These Care Teams include your primary Cardiologist (physician) and Advanced Practice Providers (APPs -  Physician Assistants and Nurse Practitioners) who all work together to provide you with the care you need, when you need it.  We recommend signing up for the patient portal called "MyChart".  Sign up information is provided on this After Visit Summary.  MyChart is used to connect with patients for Virtual Visits (Telemedicine).  Patients are able to view lab/test results, encounter notes, upcoming appointments, etc.  Non-urgent messages can be sent to your provider as well.   To learn more about what you can do with MyChart, go to [email or web address].    Your next appointment:   4 month(s)  Provider:   Gloriann Larger, MD   Other Instructions     1st Floor: - Lobby - Registration  - Pharmacy  - Lab - Cafe  2nd Floor: - PV Lab - Diagnostic Testing (echo, CT, nuclear med)  3rd Floor: - Vacant  4th Floor: - TCTS (cardiothoracic surgery) - AFib Clinic - Structural Heart Clinic - Vascular Surgery  - Vascular  Ultrasound  5th Floor: - [facility]Care Cardiology (general and EP) - Clinical Pharmacy for coumadin, hypertension, lipid, weight-loss medications, and med management appointments    Valet parking services will be available as well.

## 2023-02-22 ENCOUNTER — Telehealth: Payer: Self-pay | Admitting: Adult Health

## 2023-02-22 LAB — BASIC METABOLIC PANEL
BUN/Creatinine Ratio: 21 (ref 12–28)
BUN: 21 mg/dL (ref 8–27)
CO2: 19 mmol/L — ABNORMAL LOW (ref 20–29)
Calcium: 9.6 mg/dL (ref 8.7–10.3)
Chloride: 104 mmol/L (ref 96–106)
Creatinine, Ser: 0.98 mg/dL (ref 0.57–1.00)
Glucose: 292 mg/dL — ABNORMAL HIGH (ref 70–99)
Potassium: 4.8 mmol/L (ref 3.5–5.2)
Sodium: 139 mmol/L (ref 134–144)
eGFR: 60 mL/min/{1.73_m2} (ref >59)

## 2023-02-22 LAB — CBC
Hematocrit: 32.9 % — ABNORMAL LOW (ref 34.0–46.6)
Hemoglobin: 10.6 g/dL — ABNORMAL LOW (ref 11.1–15.9)
MCH: 31.8 pg (ref 26.6–33.0)
MCHC: 32.2 g/dL (ref 31.5–35.7)
MCV: 99 fL — ABNORMAL HIGH (ref 79–97)
Platelets: 278 10*3/uL (ref 150–450)
RBC: 3.33 x10E6/uL — ABNORMAL LOW (ref 3.77–5.28)
RDW: 12.2 % (ref 11.7–15.4)
WBC: 7.6 10*3/uL (ref 3.4–10.8)

## 2023-02-22 NOTE — Telephone Encounter (Signed)
Called the patient back. She states that she remembered last stroke they had her on brillinta and aspirin but when switched to plavix she didn't know that she was suppose to have been taking aspirin as well. So she was wanting to confirm and if so she would resume the asa. Advised based off the notes from November she was recommended to continue plavix, asa, zetia and crestor for secondary stroke prevention through PCP.  Pt was appreciative for the call back and clarification and states she will resume taking asa.

## 2023-02-22 NOTE — Telephone Encounter (Signed)
Pt asking if can take Asprin 81 mg while taking Plavix. Went to cardiology yesterday, she saw that had been taking it. But I can't remember discussing with neurology Would like a call back to discuss if should or should not take the Asprin.

## 2023-02-23 LAB — CUP PACEART REMOTE DEVICE CHECK
Date Time Interrogation Session: 20250209232214
Implantable Pulse Generator Implant Date: 20240618

## 2023-02-24 NOTE — Progress Notes (Signed)
Carelink Summary Report / Loop Recorder

## 2023-03-01 ENCOUNTER — Ambulatory Visit: Payer: Medicare Other | Admitting: Internal Medicine

## 2023-03-01 ENCOUNTER — Encounter: Payer: Self-pay | Admitting: Internal Medicine

## 2023-03-01 VITALS — BP 122/80 | HR 75 | Ht 60.0 in | Wt 110.0 lb

## 2023-03-01 DIAGNOSIS — E1159 Type 2 diabetes mellitus with other circulatory complications: Secondary | ICD-10-CM

## 2023-03-01 DIAGNOSIS — E1165 Type 2 diabetes mellitus with hyperglycemia: Secondary | ICD-10-CM

## 2023-03-01 DIAGNOSIS — E1142 Type 2 diabetes mellitus with diabetic polyneuropathy: Secondary | ICD-10-CM

## 2023-03-01 DIAGNOSIS — Z794 Long term (current) use of insulin: Secondary | ICD-10-CM

## 2023-03-01 LAB — POCT GLYCOSYLATED HEMOGLOBIN (HGB A1C): Hemoglobin A1C: 8.5 % — AB (ref 4.0–5.6)

## 2023-03-01 MED ORDER — TIRZEPATIDE 5 MG/0.5ML ~~LOC~~ SOAJ: 5.0000 mg | SUBCUTANEOUS | 3 refills | Status: DC

## 2023-03-01 NOTE — Patient Instructions (Addendum)
-   Switch Victoza to Mounjaro 5 mg ONCE WEEKLY  - Continue Toujeo 20 units once daily  - Continue  Farxiga 10 mg, 1 tablet daily  - Increase Humalog 8 units with each meal PLUS the scale below if needed - Humalog correctional insulin: ADD extra units on insulin to your meal-time Humalog dose if your blood sugars are higher than 170. Use the scale below to help guide you:   Blood sugar before meal Number of units to inject  Less than 170 0 unit  171 - 210 1 units  211 - 250 2 units  251 - 290 3 units  291 - 330 4 units  331 - 370 5 units  371 - 410 6 units       HOW TO TREAT LOW BLOOD SUGARS (Blood sugar LESS THAN 70 MG/DL) Please follow the RULE OF 15 for the treatment of hypoglycemia treatment (when your (blood sugars are less than 70 mg/dL)   STEP 1: Take 15 grams of carbohydrates when your blood sugar is low, which includes:  3-4 GLUCOSE TABS  OR 3-4 OZ OF JUICE OR REGULAR SODA OR ONE TUBE OF GLUCOSE GEL    STEP 2: RECHECK blood sugar in 15 MINUTES STEP 3: If your blood sugar is still low at the 15 minute recheck --> then, go back to STEP 1 and treat AGAIN with another 15 grams of carbohydrates.

## 2023-03-01 NOTE — Progress Notes (Signed)
Name: Sarah [name]  Age/ Sex: 78 y.o., female   MRN/ DOB: [id number], 25-Feb-1945     PCP: [place], [name], MD   Reason for Endocrinology Evaluation: Type 2 Diabetes Mellitus  Initial Endocrine Consultative Visit: 08/15/2019    PATIENT IDENTIFIER: Sarah [name] is a 78 y.o. female with a past medical history of T2DM, CVA, Dyslipidemia  And CAD (S/P PCI 03/2020). The patient has followed with Endocrinology clinic since 08/15/2019 for consultative assistance with management of her diabetes.  DIABETIC HISTORY:  Sarah [name] was diagnosed with DM many years ago. Her hemoglobin A1c has ranged from 10.3% in 2020, peaking at 16.3% in 2016.   On her initial visit to our clinic she had an A1c of 12.5 % She was on MDI regimen but was not taking Metformin due to GI side effects so we stopped it. We adjusted MDI regimen and continued Victoza    Started Farxiga 02/2020    Lives with mother who is 92 yrs ago   She was hospitalized 10/2022 for acute CVA.  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 Continues to follow up with [name]   Chauncey Mann  was discontinued by nephrology   SUBJECTIVE:   During the last visit (10/25/2022): A1c 12.2 %    Today (03/01/2023): Sarah [name] is here for a follow up on diabetes management. She is accompanied by her sister ( [name])from [place] today .   She had a follow-up with cardiology 02/21/2023 for CAD, HTN, and dyslipidemia She also had a follow-up with neurology for history of CVA 11/2022  She had one episode of nausea and vomiting last week but this has resolved , no fever  Has occasional constipation    HOME DIABETES REGIMEN:  Victoza 1.2 mg daily  Toujeo 20 units daily Humalog 6 units with each meal  Farxiga 10 mg daily  Correction factor:  Humalog (BG -130/30)     Statin: yes ACE-I/ARB: yes    CONTINUOUS GLUCOSE MONITORING RECORD INTERPRETATION    Dates of Recording: 2/5-2/18/2025  Sensor description:dexcom  Results statistics:   CGM use % of time 86  Average and SD 264/68  Time in range      9  %  % Time Above 180 40  % Time above 250 51  % Time Below target 0   Glycemic patterns summary: BGs are elevated throughout the day and night  Hyperglycemic episodes throughout the day and night but worse during the day after meals  Hypoglycemic episodes occurred N/A  Overnight periods: High   DIABETIC COMPLICATIONS: Microvascular complications:  Retinopathy, neuropathy  Denies: CKD Last Eye Exam: Completed 12/24/2020  Macrovascular complications:  CVA, CAD (S/P PCI 03/2020 Denies: PVD   HISTORY:  Past Medical History:  Past Medical History:  Diagnosis Date   Bilateral carpal tunnel syndrome 03/27/2019   Boils    Glaucoma    Heart murmur    HTN (hypertension)    Hx of adenomatous colonic polyps    Hyperlipidemia    Osteoporosis    Pneumonia    Stroke (HCC)    Type II or unspecified type diabetes mellitus without mention of complication, uncontrolled    Past Surgical History:  Past Surgical History:  Procedure Laterality Date   ABDOMINAL HYSTERECTOMY     CARPAL TUNNEL RELEASE  right   COLONOSCOPY  12-10-10   per Dr. Arlyce Dice, clear, repeat in 7 yrs    CORONARY STENT INTERVENTION N/A 03/11/2020   Procedure: CORONARY STENT INTERVENTION;  Surgeon: Corky Crafts, MD;  Location: [facility] INVASIVE CV LAB;  Service: Cardiovascular;  Laterality: N/A;   CORONARY ULTRASOUND/IVUS N/A 03/11/2020   Procedure: Intravascular Ultrasound/IVUS;  Surgeon: Corky Crafts, MD;  Location: [facility] INVASIVE CV LAB;  Service: Cardiovascular;  Laterality: N/A;   INCISION AND DRAINAGE PERIRECTAL ABSCESS N/A 09/26/2015   Procedure: IRRIGATION AND DEBRIDEMENT PERIRECTAL ABSCESS;  Surgeon: Rodman Pickle, MD;   Location: [facility] OR;  Service: General;  Laterality: N/A;   KNEE ARTHROSCOPY     right   LEFT HEART CATH AND CORONARY ANGIOGRAPHY N/A 03/11/2020   Procedure: LEFT HEART CATH AND CORONARY ANGIOGRAPHY;  Surgeon: Corky Crafts, MD;  Location: [facility] INVASIVE CV LAB;  Service: Cardiovascular;  Laterality: N/A;   LOOP RECORDER INSERTION N/A 10/19/2016   Procedure: LOOP RECORDER INSERTION;  Surgeon: Hillis Range, MD;  Location: [facility] INVASIVE CV LAB;  Service: Cardiovascular;  Laterality: N/A;   ORIF ANKLE FRACTURE Right 03/24/2012   Procedure: OPEN REDUCTION INTERNAL FIXATION (ORIF) ANKLE FRACTURE;  Surgeon: Nadara Mustard, MD;  Location: [facility] OR;  Service: Orthopedics;  Laterality: Right;  Open Reduction Internal Fixation Right Bimalleolar ankle fracture   POLYPECTOMY     TEE WITHOUT CARDIOVERSION N/A 10/18/2016   Procedure: TRANSESOPHAGEAL ECHOCARDIOGRAM (TEE);  Surgeon: Pricilla Riffle, MD;  Location: [facility] ENDOSCOPY;  Service: Cardiovascular;  Laterality: N/A;   TEE WITHOUT CARDIOVERSION N/A 10/14/2022   Procedure: TRANSESOPHAGEAL ECHOCARDIOGRAM;  Surgeon: Lewayne Bunting, MD;  [facility] CV LAB;  Service: Cardiovascular;  Laterality: N/A;   TONSILLECTOMY     Social History:  reports that she quit smoking about 6 years ago. Her smoking use included cigarettes. She has never used smokeless tobacco. She reports current alcohol use. She reports that she does not use drugs. Family History:  Family History  Problem Relation Age of Onset   Diabetes Mother    Hypertension Mother    Stroke Father    Stroke Maternal Uncle    Stroke Paternal Uncle    Colon cancer Neg Hx    Esophageal cancer Neg Hx    Rectal cancer Neg Hx    Stomach cancer Neg Hx    Inflammatory bowel disease Neg Hx    Liver disease Neg Hx    Pancreatic cancer Neg Hx      HOME MEDICATIONS: Allergies as of 03/01/2023       Reactions   Penicillins Other (See Comments)   Does not know reaction        Medication List         Accurate as of March 01, 2023  1:30 PM. If you have any questions, ask your nurse or doctor.          amLODipine 5 MG tablet Commonly known as: NORVASC Take 1 tablet (5 mg total) by mouth daily.   aspirin EC 81 MG tablet Take 1 tablet (81 mg total) by mouth daily. Swallow whole.   cholecalciferol 25 MCG (1000 UNIT) tablet Commonly known as: VITAMIN D3 Take 1,000 Units by mouth daily.   clopidogrel 75 MG tablet Commonly known as: PLAVIX Take 1 tablet (75 mg total) by mouth daily. Start AFTER completing the brilinta prescription   dapagliflozin propanediol 10 MG Tabs tablet Commonly known as: Farxiga Take 1 tablet (10 mg total) by mouth daily.  ezetimibe 10 MG tablet Commonly known as: ZETIA Take 1 tablet (10 mg total) by mouth daily.   furosemide 20 MG tablet Commonly known as: LASIX Take 1 tablet by mouth daily (only as needed)   insulin lispro 100 UNIT/ML KwikPen Commonly known as: HumaLOG KwikPen Max daily 30 units What changed:  how much to take how to take this when to take this   latanoprost 0.005 % ophthalmic solution Commonly known as: XALATAN Place 1 drop into both eyes at bedtime.   liraglutide 18 MG/3ML Sopn Commonly known as: VICTOZA 1.2 mg.   losartan 50 MG tablet Commonly known as: COZAAR TAKE 1 TABLET BY MOUTH DAILY   Magnesium 250 MG Tabs Take 250 mg by mouth daily.   nitroGLYCERIN 0.4 MG SL tablet Commonly known as: NITROSTAT Place 1 tablet (0.4 mg total) under the tongue every 5 (five) minutes as needed for chest pain. one tab every 5 minutes up to 3 tablets total over 15 minutes.   ondansetron 4 MG tablet Commonly known as: Zofran Take 1 tablet (4 mg total) by mouth 2 (two) times daily as needed for nausea or vomiting.   rosuvastatin 20 MG tablet Commonly known as: Crestor Take 1 tablet (20 mg total) by mouth daily.   sertraline 50 MG tablet Commonly known as: ZOLOFT TAKE 1 TABLET BY MOUTH DAILY   topiramate 50 MG  tablet Commonly known as: Topamax Take 1 tablet (50 mg total) by mouth at bedtime.   Toujeo SoloStar 300 UNIT/ML Solostar Pen Generic drug: insulin glargine (1 Unit Dial) Inject 14 Units into the skin daily in the afternoon.         OBJECTIVE:   Vital Signs: BP 122/80 (BP Location: Left Arm, Patient Position: Sitting, Cuff Size: Normal)   Pulse 75   Ht 5' (1.524 m)   Wt 110 lb (49.9 kg)   SpO2 95%   BMI 21.48 kg/m   Wt Readings from Last 3 Encounters:  03/01/23 110 lb (49.9 kg)  02/21/23 108 lb 6.4 oz (49.2 kg)  11/17/22 101 lb 12.8 oz (46.2 kg)     Exam: General: Pt appears well and is in NAD  Lungs: Clear with good BS bilat   Heart: RRR   Extremities: No pretibial edema.   Neuro: MS is good with appropriate affect, pt is alert and Ox3   DM foot exam: 03/01/2023 The skin of the feet is intact without sores or ulcerations. The pedal pulses are 2+ on right and 2+ on left. The sensation is intact to a screening 5.07, 10 gram monofilament bilaterally    DATA REVIEWED:  Lab Results  Component Value Date   HGBA1C 8.5 (A) 03/01/2023   HGBA1C 12.2 (H) 10/12/2022   HGBA1C 8.5 (A) 05/10/2022    Latest Reference Range & Units 02/21/23 14:03  Sodium 134 - 144 mmol/L 139  Potassium 3.5 - 5.2 mmol/L 4.8  Chloride 96 - 106 mmol/L 104  CO2 20 - 29 mmol/L 19 (L)  Glucose 70 - 99 mg/dL 742 (H)  BUN 8 - 27 mg/dL 21  Creatinine 5.95 - 6.38 mg/dL 7.56  Calcium 8.7 - 43.3 mg/dL 9.6  BUN/Creatinine Ratio 12 - 28  21  eGFR >59 mL/min/1.73 60   Old records , labs and images have been reviewed.    ASSESSMENT / PLAN / RECOMMENDATIONS:   1) Type 2 Diabetes Mellitus, Poorly controlled, With neuropathic and  macrovascular  complications - Most recent A1c of 8.5 %. Goal A1c < 7.0 %.    -  A1c down from 12.2% to 8.5% -The patient does have a home care agent 8 hours every day during weekdays, but the aide is only able to remind the patient to take the medication but does not  administer medications for her -With the recent update from the manufacture company with discontinuation of the Victoza I will switch her to [facility] -I will also increase her prandial insulin due to postprandial hyperglycemia on CGM download  MEDICATIONS: -Switch Victoza to Mounjaro 5 mg weekly -Continue Farxiga  10 mg, 1 tablet daily  -Continue Toujeo 20 units daily -Increase Humalog 8 units TIDQAC  -Continue CF : Humalog (BG-130/30) TIDQAC   EDUCATION / INSTRUCTIONS: BG monitoring instructions: Patient is instructed to check her blood sugars 3 times a day, before meals  Call [facility] if: BG persistently < 70 I reviewed the Rule of 15 for the treatment of hypoglycemia in detail with the patient. Literature supplied.   2) Diabetic complications:  Eye: there's a questionable diabetic retinopathy from her history Neuro/ Feet: Does have known diabetic peripheral neuropathy .  Renal: Patient does not have known baseline CKD. She   is  on an ACEI/ARB at present.       F/U in 4 months  I spent 28 minutes preparing to see the patient by review of recent labs, imaging and procedures, obtaining and reviewing separately obtained history, communicating with the patient, ordering medications, tests or procedures, and documenting clinical information in the EHR including the differential Dx, treatment, and any further evaluation and other management    Signed electronically by: Lyndle Herrlich, MD  [facility] End[facility]  [facility] [address] [place] [place], [place] [address] Phone: [phone] FAX: [phone]   CC: [place], [name], MD [address] [place] [address] Phone: [phone]  Fax: [phone]  Return to Endocrinology clinic as below: Future Appointments  Date Time Provider Department Center  03/17/2023  2:15 PM Ihor Austin, NP [facility] None  03/28/2023  7:10 AM [facility] DEVICE REMOTES [facility]  03/29/2023  7:10 AM [facility] DEVICE REMOTES [facility]  04/21/2023  1:30 PM Terri Piedra, DO [facility] None  05/03/2023  7:10 AM [facility] DEVICE REMOTES [facility]  05/09/2023  9:20 AM [facility]-ANNUAL WELLNESS VISIT [facility]  05/30/2023  4:00 PM Christell Constant, MD [facility]  06/07/2023  7:30 AM [facility] DEVICE REMOTES [facility]  07/12/2023  7:10 AM [facility] DEVICE REMOTES [facility]  08/16/2023  7:10 AM [facility] DEVICE REMOTES [facility]  09/20/2023  7:10 AM [facility] DEVICE REMOTES [facility]  10/25/2023  7:10 AM [facility] DEVICE REMOTES [facility]

## 2023-03-04 ENCOUNTER — Encounter: Payer: Self-pay | Admitting: Internal Medicine

## 2023-03-08 ENCOUNTER — Encounter: Payer: Self-pay | Admitting: Cardiovascular Disease

## 2023-03-11 ENCOUNTER — Telehealth: Payer: Self-pay

## 2023-03-11 MED ORDER — TRULICITY 1.5 MG/0.5ML ~~LOC~~ SOAJ: 1.5000 mg | SUBCUTANEOUS | 3 refills | Status: DC

## 2023-03-11 NOTE — Telephone Encounter (Signed)
 Patient sister [name] states that the Greggory Keen is not covered by insurance.

## 2023-03-15 ENCOUNTER — Other Ambulatory Visit: Payer: Self-pay

## 2023-03-15 MED ORDER — TRULICITY 1.5 MG/0.5ML ~~LOC~~ SOAJ: 1.5000 mg | SUBCUTANEOUS | 3 refills | Status: DC

## 2023-03-16 NOTE — Progress Notes (Unsigned)
 [facility]  PATIENT: Sarah [name] DOB: Apr 02, 1945    REASON FOR VISIT: Follow-up for stroke right MCA 10/2016 readmitted 02/08/2018 for right parieto-occipital stroke  HISTORY FROM: Patient, sister and chart  Chief complaint: No chief complaint on file.     HPI:  Update 03/17/2023 JM: Patient returns for follow-up visit.  Stable from stroke standpoint without new stroke/TIA symptoms.  Reports residual ***.   Increased topiramate dosage to 50 mg nightly at prior visit for worsening headaches post stroke ***  Remains on DAPT, Crestor and Zetia Had follow-up visit with cardiology last month, note reviewed, advised indefinite DAPT Routinely follows with PCP and endocrinology.        History provided for reference purposes only Update 11/17/2022 JM: Patient is being seen for sooner scheduled visit due to recent stroke.  She presented to ED on 10/11/2022 due to new onset right sided leg weakness with fall.  Stroke workup revealed several small infarcts at left brain watershed areas and left cerebellum, etiology concerning for cardioembolic source.  CTA showed moderate to advanced bilateral MCA stenosis.  2D echo showed hyperechoic mass on left coronary cusp, TEE showed nodular density left coronary cusp (possibly nodular calcification) and mobile density right coronary cusp (possible vegetation vs lambl's excrescence).  On Plavix PTA with noted noncompliance, recommended aspirin and Brilinta for 1 month then switch to aspirin and Plavix, if recurrent stroke while on DAPT, recommended consideration of anticoagulation, recommended outpatient cardiology follow-up.  New ILR placed 06/2022, interrogated without evidence of A-fib.  LDL 44, A1c 12.2. Continued home dose Crestor and Zetia.  Recommended close PCP follow-up for better DM control.  Therapies recommended home health therapies.  Reports gradually recovering since discharge.  Still has some right-sided weakness  but gradually improving.  Continues working with home health PT.  Using rollator walker at all times, no recent falls.  Living with 80 year old mother, maintains ADLs independently, does have aide assistance and sister in law who lives close by to assist with IADLs and transportation as patient does not drive (sister in law waiting in lobby today). Hx of chronic frontal headaches previously well managed on topiramate 25 mg nightly but worsened since stroke, now occurring about every other day, still frontal and behind eyes, associated with photophobia, previously with nausea but not recently. Questions if topiramate dosage can be increased. Denies OTC pain reliever use. Headaches typically last about 20 minutes and then gradually improve.   Completed 1 month of Brilinta and aspirin, now on aspirin and Plavix without side effects as well as Crestor and Zetia without side effects Has since had follow-up with endocrinology who noted higher A1c due to medication nonadherence, adjustments to antidiabetic regimen made, Follow-up in February. Reports glucose levels have been good, reports using Dexcom at home but does not have monitor with her today and she is waiting on new sensors. Sister and aide assist with medication management.  Has not yet had follow-up with cardiology, loop recorder monitored which has not shown atrial fibrillation thus far Sleep study completed 09/2022 without any significant sleep apnea  Update 09/07/2022 JM: Returns for follow-up visit unaccompanied.  Residual gait and balance impairment and at times unsteadiness, some improvement since prior visit. Completed PT last week, noted some benefit. Continues to do HEP.  Ambulates with cane, denies any recent falls.  Denies new stroke/TIA symptoms.  At prior visit, complained of daily headaches and initiated topiramate 25mg  nightly, has not had any recent headaches, tolerating topamax well.  Also  evaluated by Dr. [name] for possible underlying  sleep apnea, sleep study scheduled 9/22.   Reports compliance on Plavix, atorvastatin and Zetia.  Routinely follows with PCP and endocrinology for stroke risk factor management.  Continues to have decreased vision in left eye, routinely followed by ophthalmology, was told she had blood behind her eye, plans to f/u towards the end of this year.   Update 03/09/2022 JM: Overall stable from stroke standpoint without any new or worsening stroke/TIA symptoms.  Reports residual gait difficulty and imbalance, has improved some since prior visit, has graduated from walker to cane. Still feels unsteady at times when ambulating. Working with therapy at home. Denies any recent falls.   Continues to experience headaches, have been every other day if not daily. Can occur mid afternoon typically, located frontal and behind eyes. Questions if due to her vision, can feel eye fatigue/strain after reading for prolonged period of time or watching television, has not noticed worsening of headaches around this time. Has f/u with eye doctor in April. Denies taking OTC medications to help with headaches.  She also continues to complain of daytime fatigue.  Compliant on Plavix, atorvastatin and Zetia - currently prescribed 2x 40mg  atorvastatin tablets (due to difficulty swallowing 80mg  tablet) but believes only take 40mg  per day Blood pressure typically well controlled, slightly on higher side today, monitored by [facility] therapy and has been stable Routinely follows with PCP and endocrinology Cardiac monitor negative for atrial fibrillation  Update 11/04/2021 JM: Patient returns for recent hospitalization.  She is accompanied by her sister. She presented to ED on 10/13 with acute encephalopathy with memory lapses and feeling unsteady/wobbly with walking and found to have punctate left cerebellar and left parietal lobe infarctions likely incidental finding on MRI during encephalopathy work-up, etiology unclear possibly cardioembolic  pattern.  MRA negative LVO, bilateral M1 segment stenosis.  Carotid Doppler unremarkable.  EEG normal.  EF 60 to 65%.  Recommend 30-day event monitor to evaluate for A-fib, previously had loop recorder without evidence of A-fib.  LDL 227.  A1c 12.6.  Recommended DAPT for 3 weeks then Plavix alone and as well as continuation of home dose Zetia and atorvastatin and recommended follow-up outpatient with lipid clinic for further lipid management.  Also advised outpatient PCP follow-up for better DM control.  She was evaluated by therapies, initially recommended CIR but due to improvement during hospitalization, recommendations changed to outpatient therapy and 24/7 care/supervision.   Reports since discharge, she has been experiencing generalized weakness sensation with increased fatigue and awakening about 3 times per week with left-sided headaches, these usually get better throughout the day, will use Tylenol if needed with benefit (denies overuse).  She also reports some visual changes in her left eye, has appt with ophthalmologist Dr. Clelia Croft on Monday.  She denies any specific weakness or speech difficulty.  Does have some gait impairment and currently ambulating with RW, denies any recent falls.  She is living at home but family has been providing 24/7 care/assistance.  She is scheduled to start outpatient therapies tomorrow.  Denies any new stroke/TIA symptoms.  She remains on aspirin and Plavix as well as atorvastatin and Zetia, denies side effects -family has been assisting with ensuring medication compliance Blood pressure today 141/72 She has since been seen by PCP and endocrinology Has follow-up visit with cardiology 11/28 - reports she has not heard anything in regards to the cardiac monitor   Update 09/11/2020 JM:, returns for requested visit due to c/o worsening balance over  the past 2 months and gradual memory decline. Worsening knee pain after a near fall in June - seen by ortho for left knee  arthritis and bursitis. Limiting ambulation due to pain.  She is able to ambulate without assistive device.  Mild decline of memory reported but able to maintain ADLs and IADLs independently.  Sedentary lifestyle without any type of memory or cognitive exercises.  Denies any specific weakness, numbness/tingling, speech difficulty or confusion.  Currently on aspirin, plavix, zetia and atorvastatin for secondary stroke prevention and hx of CAD. Blood pressure today 143/72.  Glucose levels routinely monitored which has been stable.  No further concerns at this time.  Update 06/21/2019:, Sarah [name] is being seen for stroke follow-up unaccompanied.  She has been stable from a stroke standpoint without new or worsening stroke/TIA symptoms.  Prior complaints of worsening imbalance, left arm numbness/tingling and speech difficulty with MRI and MRA stable without acute abnormalities.  She reports ongoing improvement and continues to do HEP as recommended by therapy at home.  EMG/NCV completed for left arm numbness/tingling with mild bilateral carpal tunnel syndrome but otherwise unremarkable.  Reports left arm symptoms resolved as well as left-sided neck symptoms.  Continues on clopidogrel and atorvastatin for secondary stroke prevention.  Recently added Zetia by PCP due to recent lipid panel showing LDL 115.  Blood pressure today 146/72.  Recent A1c 12.3 with recent adjustments of insulin regimen and continuation of Metformin and Victoza by PCP.  Continues to follow routinely with PCP for HTN, HLD and DM management.  Loop recorder has not shown atrial fibrillation thus far.  No concerns at this time.  Update 02/13/2019: Sarah [name] is a 78 year old female who is being seen today for stroke follow-up.  She has been stable from a stroke standpoint with residual mild stable left homonymous hemianopsia, mild imbalance which she believes has been slightly worsening over the past couple of months along with possible  worsening of prior aphasia from stroke in 2018.  She does admit to limited physical activity and does not have an established exercise regimen along with endorsing increased stress currently caring for her mother with increased illness and increased stress due to overall COVID-19 pandemic.  She has recently signed up for [employer] program which will be starting in March.  She also states for the past 2 weeks, she has been experiencing left arm numbness/tingling but will resolve quickly and symptoms intermittent.  She does endorse left-sided neck stiffness that radiates into her upper trapezius and increased pain when looking towards the left.  Denies any increased weakness.  Unable to determine if numbness/tingling symptoms occur in certain positions.  She has not previously experienced this sensation.  Denies any other new or worsening neurological symptoms.  She did have bilateral cataract surgery in 07/2018 without complication. Continues on Plavix and atorvastatin for secondary stroke prevention without side effects.  Blood pressure today 130/60.  Continues to follow with PCP for DM management with recent A1c 9.7.  Loop recorder is not shown atrial fibrillation thus far.  Monitoring/battery life will be completed 10/2019.  She has received her first dose of Covid vaccination and plans on receiving second dose next week.  Denies new or worsening stroke/TIA symptoms.  No further concerns at this time.  06/27/2018 VIRTUAL VISIT: Sarah [name] is being seen today for three-month follow-up visit regarding recent stroke.  Initially scheduled for office visit follow-up but due to COVID-19 safety precautions, visit transition to telemedicine via MyChart with patient's consent.  She has been stable from a stroke standpoint with residual left homonymous hemianopsia and balance difficulties.  She continues to participate in outpatient PT with ongoing improvement.  Continues on Plavix and atorvastatin for secondary  stroke prevention without reported side effects. Blood pressure monitored at home.  Lab work obtained yesterday with A1c 10.3 (prior 13.5).  She continues to follow with PCP for ongoing management.  Loop recorder has not shown atrial fibrillation thus far.  No further concerns at this time.  Denies new or worsening stroke/TIA symptoms.   Hospital follow-up 03/23/2018 CM: Sarah [name], 78 year old female returns for follow-up with readmission for stroke 1/29/ 2020.  MRI of the brain acute cortical and subcortical RPO infarcts.  She initially presented with left hemianopsia.  She is now on Plavix and aspirin for 3 months and then Plavix alone.  She has not had further stroke or TIA symptoms.  She has minimal bruising and no bleeding.  Hemoglobin A1c 13.5 on hospital admission.  She claims she is trying to get that down to a more reasonable level.  She remains on Lipitor without myalgias.  Blood pressure in the office today 146/87.  She continues to go to [facility].  She is to start rehab physical therapy and occupational therapy today.  Loop recorder so far no atrial fibrillation.  She is not driving due to her vision.  She returns for reevaluation  Hospital summary: right parieto-occipital infarcts in setting of progressive R MCA stenosis, infarcts secondary to large vessel disease source Resultant L hemianopia  CT head R parietal watershed infarct. Small vessel disease.  MRI  Acute cortical and subcortical R P-O infarcts. Old R CR and R frontal lobe infarcts. Small vessel disease.  MRA head  No LVO. Focal stenosis R M1, R M2/M3, L M1 MRA neck B VA origin atherosclerosis 30-50% 2D Echo  pending  Loop interrogation - last check 1/3 w/o AF - interrogation today without AF LDL 76 HgbA1c 13.5 Lovenox 40 mg sq daily for VTE prophylaxis clopidogrel 75 mg daily prior to admission, now on aspirin 325 mg daily and clopidogrel 75 mg daily. Continue DAPT x 3 months then plavix alone Therapy  recommendations:  OP OT and PT Disposition:  Home Patient advised not to drive      REVIEW OF SYSTEMS: Full 14 system review of systems performed and notable only for those listed, all others are neg:  See HPI  ALLERGIES: Allergies  Allergen Reactions   Penicillins Other (See Comments)    Does not know reaction    HOME MEDICATIONS:   PAST MEDICAL HISTORY: Past Medical History:  Diagnosis Date   Bilateral carpal tunnel syndrome 03/27/2019   Boils    Glaucoma    Heart murmur    HTN (hypertension)    Hx of adenomatous colonic polyps    Hyperlipidemia    Osteoporosis    Pneumonia    Stroke (HCC)    Type II or unspecified type diabetes mellitus without mention of complication, uncontrolled     PAST SURGICAL HISTORY: Past Surgical History:  Procedure Laterality Date   ABDOMINAL HYSTERECTOMY     CARPAL TUNNEL RELEASE     right   COLONOSCOPY  12-10-10   per Dr. Arlyce Dice, clear, repeat in 7 yrs    CORONARY STENT INTERVENTION N/A 03/11/2020   Procedure: CORONARY STENT INTERVENTION;  Surgeon: Corky Crafts, MD;  Location: [facility] INVASIVE CV LAB;  Service: Cardiovascular;  Laterality: N/A;   CORONARY ULTRASOUND/IVUS N/A 03/11/2020  Procedure: Intravascular Ultrasound/IVUS;  Surgeon: Corky Crafts, MD;  Location: [facility] INVASIVE CV LAB;  Service: Cardiovascular;  Laterality: N/A;   INCISION AND DRAINAGE PERIRECTAL ABSCESS N/A 09/26/2015   Procedure: IRRIGATION AND DEBRIDEMENT PERIRECTAL ABSCESS;  Surgeon: Rodman Pickle, MD;  Location: [facility] OR;  Service: General;  Laterality: N/A;   KNEE ARTHROSCOPY     right   LEFT HEART CATH AND CORONARY ANGIOGRAPHY N/A 03/11/2020   Procedure: LEFT HEART CATH AND CORONARY ANGIOGRAPHY;  Surgeon: Corky Crafts, MD;  Location: [facility] INVASIVE CV LAB;  Service: Cardiovascular;  Laterality: N/A;   LOOP RECORDER INSERTION N/A 10/19/2016   Procedure: LOOP RECORDER INSERTION;  Surgeon: Hillis Range, MD;  Location: [facility] INVASIVE CV LAB;  Service:  Cardiovascular;  Laterality: N/A;   ORIF ANKLE FRACTURE Right 03/24/2012   Procedure: OPEN REDUCTION INTERNAL FIXATION (ORIF) ANKLE FRACTURE;  Surgeon: Nadara Mustard, MD;  Location: [facility] OR;  Service: Orthopedics;  Laterality: Right;  Open Reduction Internal Fixation Right Bimalleolar ankle fracture   POLYPECTOMY     TEE WITHOUT CARDIOVERSION N/A 10/18/2016   Procedure: TRANSESOPHAGEAL ECHOCARDIOGRAM (TEE);  Surgeon: Pricilla Riffle, MD;  Location: [facility] ENDOSCOPY;  Service: Cardiovascular;  Laterality: N/A;   TEE WITHOUT CARDIOVERSION N/A 10/14/2022   Procedure: TRANSESOPHAGEAL ECHOCARDIOGRAM;  Surgeon: Lewayne Bunting, MD;  Location: [facility] INVASIVE CV LAB;  Service: Cardiovascular;  Laterality: N/A;   TONSILLECTOMY      FAMILY HISTORY: Family History  Problem Relation Age of Onset   Diabetes Mother    Hypertension Mother    Stroke Father    Stroke Maternal Uncle    Stroke Paternal Uncle    Colon cancer Neg Hx    Esophageal cancer Neg Hx    Rectal cancer Neg Hx    Stomach cancer Neg Hx    Inflammatory bowel disease Neg Hx    Liver disease Neg Hx    Pancreatic cancer Neg Hx     SOCIAL HISTORY: Social History   Socioeconomic History   Marital status: Single    Spouse name: Not on file   Number of children: Not on file   Years of education: Not on file   Highest education level: Not on file  Occupational History   Not on file  Tobacco Use   Smoking status: Former    Current packs/day: 0.00    Types: Cigarettes    Quit date: 10/15/2016    Years since quitting: 6.4   Smokeless tobacco: Never   Tobacco comments:    smokes occ.   Vaping Use   Vaping status: Never Used  Substance and Sexual Activity   Alcohol use: Yes    Alcohol/week: 0.0 standard drinks of alcohol    Comment: occ   Drug use: No   Sexual activity: Not on file  Other Topics Concern   Not on file  Social History Narrative   Caffiene coffee 1 cup in am.   Retired from [employer].     Social Drivers of  Corporate investment banker Strain: Low Risk  (05/03/2022)   Overall Financial Resource Strain (CARDIA)    Difficulty of Paying Living Expenses: Not hard at all  Food Insecurity: No Food Insecurity (10/18/2022)   Hunger Vital Sign    Worried About Running Out of Food in the Last Year: Never true    Ran Out of Food in the Last Year: Never true  Transportation Needs: No Transportation Needs (10/18/2022)   PRAPARE - Transportation    Lack  of Transportation (Medical): No    Lack of Transportation (Non-Medical): No  Physical Activity: Insufficiently Active (05/03/2022)   Exercise Vital Sign    Days of Exercise per Week: 2 days    Minutes of Exercise per Session: 10 min  Stress: No Stress Concern Present (05/03/2022)   [employer] of Occupational Health - Occupational Stress Questionnaire    Feeling of Stress : Not at all  Social Connections: Moderately Integrated (05/03/2022)   Social Connection and Isolation Panel [NHANES]    Frequency of Communication with Friends and Family: More than three times a week    Frequency of Social Gatherings with Friends and Family: More than three times a week    Attends Religious Services: More than 4 times per year    Active Member of [employer] or Organizations: Yes    Attends Banker Meetings: More than 4 times per year    Marital Status: Widowed  Intimate Partner Violence: Not At Risk (10/12/2022)   Humiliation, Afraid, Rape, and Kick questionnaire    Fear of Current or Ex-Partner: No    Emotionally Abused: No    Physically Abused: No    Sexually Abused: No     PHYSICAL EXAM  There were no vitals filed for this visit.  There is no height or weight on file to calculate BMI.  General: well developed, well nourished, very pleasant elderly African-American female, seated, in no evident distress   Neurologic Exam Mental Status: Awake and fully alert. Occasional stuttering of speech/speech hesitancy (chronic).  Oriented to place and  time. Recent memory subjectively impaired and remote memory intact. Attention span, concentration and fund of knowledge appropriate during visit. Mood and affect appropriate.  Cranial Nerves: Pupils equal, briskly reactive to light. Extraocular movements full without nystagmus. Visual fields full to confrontation testing. Hearing intact. Facial sensation intact. Face, tongue, palate moves normally and symmetrically.  Motor: Normal bulk and tone. Normal strength in all tested extremity muscles except 4/5 right hip flexor weakness and mild left grip weakness (chronic) Sensory.: intact to touch , pinprick , position and vibratory sensation.  Coordination: Rapid alternating movements normal in all extremities except slightly decreased left hand dexterity (chronic from prior stroke). Finger-to-nose and heel-to-shin performed accurately bilaterally. Gait and Station: Arises from chair with mild difficulty. Stance is slightly hunched. Gait demonstrates decreased stride length and step height bilaterally without turning of RLE with mild imbalance and use of rollator walker.  Tandem walk and heel toe not attempted. Reflexes: 1+ and symmetric. Toes downgoing.      DIAGNOSTIC DATA (LABS, IMAGING, TESTING) - I reviewed patient records, labs, notes, testing and imaging myself where available.  Lab Results  Component Value Date   WBC 7.6 02/21/2023   HGB 10.6 (L) 02/21/2023   HCT 32.9 (L) 02/21/2023   MCV 99 (H) 02/21/2023   PLT 278 02/21/2023      Component Value Date/Time   NA 139 02/21/2023 1403   K 4.8 02/21/2023 1403   CL 104 02/21/2023 1403   CO2 19 (L) 02/21/2023 1403   GLUCOSE 292 (H) 02/21/2023 1403   GLUCOSE 134 (H) 10/15/2022 0711   BUN 21 02/21/2023 1403   CREATININE 0.98 02/21/2023 1403   CREATININE 0.55 (L) 10/24/2019 1602   CALCIUM 9.6 02/21/2023 1403   PROT 4.9 (L) 10/14/2022 0723   PROT 6.2 09/22/2020 0958   ALBUMIN 1.8 (L) 10/14/2022 0723   ALBUMIN 3.5 (L) 09/22/2020 0958    AST 40 10/14/2022 0723   ALT 34  10/14/2022 0723   ALKPHOS 61 10/14/2022 0723   BILITOT 0.3 10/14/2022 0723   BILITOT <0.2 09/22/2020 0958   GFRNONAA >60 10/15/2022 0711   GFRNONAA 94 10/24/2019 1602   GFRAA 103 03/04/2020 1025   GFRAA 109 10/24/2019 1602   Lab Results  Component Value Date   CHOL 139 10/12/2022   HDL 79 10/12/2022   LDLCALC 44 10/12/2022   TRIG 79 10/12/2022   CHOLHDL 1.8 10/12/2022   Lab Results  Component Value Date   HGBA1C 8.5 (A) 03/01/2023        ASSESSMENT AND PLAN    Sarah [name] is a 78 y.o. female with history of recent several small infarcts at left brain watershed areas and left cerebellum in 10/2022 concerning for cardioembolic source, and hx of punctate left cerebellar and left parietal lobe infarcts in 10/2021 likely incidental finding on MRI during encephalopathy work-up of unclear etiology, right parieto-occipital infarcts in setting of progressive right MCA stenosis in 2020 likely secondary to large vessel disease source and right MCA stroke in 2018 s/p ILR without evidence of A-fib during 4 year monitoring duration.     History of multiple strokes -Continue working with home health therapies for hopeful ongoing recovery for residual right leg weakness and gait impairment, continued use of rollator walker at all times unless otherwise instructed -s/p new ILR 06/2022, no evidence of A-fib thus far -continue aspirin, plavix, Crestor 20 mg daily and Zetia for secondary stroke prevention managed/prescribed by PCP/cardiology (ongoing use of DAPT per cardiology recommendations) -Continue close PCP, endocrinology and cardiology follow-up for aggressive stroke risk factor management maintain blood pressure goal <130/80, diabetes with hemoglobin A1c goal below 7.0% and lipids with LDL cholesterol goal below 70 mg/dL.  -LDL 44 (10/2022) -A1c 8.5 (02/2023)   Mobile density on right coronary cusp -d/x'd 10/2022 -Followed by cardiology -2D  echo hyperechoic mass on left coronary cusp -TEE nodular density left coronary cusp (possible nodular calcification), mobile density right coronary cusp (possible vegetation vs lambl's excrescence)  -Completed 1 month of aspirin Brilinta, now on aspirin and Plavix indefinitely per cards    Frontal headaches (since 10/2021) -Initially improved on topiramate but worsened after recent stroke -Recommend increasing topiramate to 50 mg nightly -Recent sleep study 09/2022 no significant sleep apnea      Follow-up in March as scheduled     I spent 50 minutes of face-to-face and non-face-to-face time with patient.  This included previsit chart review including review of recent hospitalization, lab review, study review, order entry, electronic health record documentation, patient education and discussion regarding above diagnoses and treatment plan and answered all other questions to patient's satisfaction  Ihor Austin, [facility]  [facility] [address] [place], [place] [address]  Phone [phone] Fax [phone] Note: This document was prepared with digital dictation and possible smart phrase technology. Any transcriptional errors that result from this process are unintentional.

## 2023-03-17 ENCOUNTER — Telehealth: Payer: Self-pay | Admitting: Adult Health

## 2023-03-17 ENCOUNTER — Ambulatory Visit: Payer: Medicare Other | Admitting: Adult Health

## 2023-03-17 ENCOUNTER — Encounter: Payer: Self-pay | Admitting: Adult Health

## 2023-03-17 VITALS — BP 130/75 | HR 75 | Ht 60.0 in | Wt 112.8 lb

## 2023-03-17 DIAGNOSIS — I69398 Other sequelae of cerebral infarction: Secondary | ICD-10-CM

## 2023-03-17 DIAGNOSIS — E538 Deficiency of other specified B group vitamins: Secondary | ICD-10-CM

## 2023-03-17 DIAGNOSIS — G44221 Chronic tension-type headache, intractable: Secondary | ICD-10-CM | POA: Diagnosis not present

## 2023-03-17 DIAGNOSIS — Z8673 Personal history of transient ischemic attack (TIA), and cerebral infarction without residual deficits: Secondary | ICD-10-CM | POA: Diagnosis not present

## 2023-03-17 DIAGNOSIS — R413 Other amnesia: Secondary | ICD-10-CM | POA: Diagnosis not present

## 2023-03-17 DIAGNOSIS — R269 Unspecified abnormalities of gait and mobility: Secondary | ICD-10-CM

## 2023-03-17 NOTE — Patient Instructions (Addendum)
 Continue Topamax 50 mg nightly for headache prevention  We will check lab work today for look for reversible causes of memory difficulty  You will be called to schedule an MRI brain to ensure no new abnormalities causing memory difficulties   Recommend completion of EEG to further evaluate memory concerns   Ensure you are routinely doing exercises such as cross word puzzles, word search, sudoku can help with short term memory  Restart home health therapy - you should be called to get this set up   Continue aspirin 81 mg daily and clopidogrel 75 mg daily  and Zetia at current dosages managed/prescribed by PCP/cardiology  Continue to follow up with PCP regarding blood pressure, diabetes and cholesterol management  Maintain strict control of hypertension with blood pressure goal below 130/90, diabetes with A1c goal<7 and cholesterol with LDL cholesterol (bad cholesterol) goal below 70 mg/dL.   Signs of a Stroke? Follow the BEFAST method:  Balance Watch for a sudden loss of balance, trouble with coordination or vertigo Eyes Is there a sudden loss of vision in one or both eyes? Or double vision?  Face: Ask the person to smile. Does one side of the face droop or is it numb?  Arms: Ask the person to raise both arms. Does one arm drift downward? Is there weakness or numbness of a leg? Speech: Ask the person to repeat a simple phrase. Does the speech sound slurred/strange? Is the person confused ? Time: If you observe any of these signs, call 911.     Followup in the future with me in 6 months or call earlier if needed     Thank you for coming to see [place] at [facility]. I hope we have been able to provide you high quality care today.  You may receive a patient satisfaction survey over the next few weeks. We would appreciate your feedback and comments so that we may continue to improve ourselves and the health of our patients.

## 2023-03-17 NOTE — Telephone Encounter (Signed)
 [facility] is going to take this patient.

## 2023-03-18 ENCOUNTER — Telehealth: Payer: Self-pay | Admitting: Adult Health

## 2023-03-18 NOTE — Telephone Encounter (Signed)
 Gretchen @ [facility]  is asking for a verbal order [id number] health appointment reschedule.  Pt does not want to be seen today, she wants to be seen next week. Please call, her vm is secure

## 2023-03-21 ENCOUNTER — Encounter: Payer: Self-pay | Admitting: Adult Health

## 2023-03-21 ENCOUNTER — Telehealth: Payer: Self-pay | Admitting: Adult Health

## 2023-03-21 NOTE — Telephone Encounter (Signed)
 Called Gretchen back and was able to provide a VO for the pt to start [facility] this week. She is planning to go to the patient's home 03/22/23

## 2023-03-21 NOTE — Telephone Encounter (Signed)
 no auth required sent to [facility] [phone]

## 2023-03-22 ENCOUNTER — Telehealth: Payer: Self-pay | Admitting: Adult Health

## 2023-03-22 LAB — CBC WITH DIFFERENTIAL/PLATELET
Basophils Absolute: 0.1 10*3/uL (ref 0.0–0.2)
Basos: 1 %
EOS (ABSOLUTE): 0.1 10*3/uL (ref 0.0–0.4)
Eos: 1 %
Hematocrit: 32.7 % — ABNORMAL LOW (ref 34.0–46.6)
Hemoglobin: 10.8 g/dL — ABNORMAL LOW (ref 11.1–15.9)
Immature Grans (Abs): 0 10*3/uL (ref 0.0–0.1)
Immature Granulocytes: 0 %
Lymphocytes Absolute: 2.2 10*3/uL (ref 0.7–3.1)
Lymphs: 28 %
MCH: 32.5 pg (ref 26.6–33.0)
MCHC: 33 g/dL (ref 31.5–35.7)
MCV: 99 fL — ABNORMAL HIGH (ref 79–97)
Monocytes Absolute: 0.5 10*3/uL (ref 0.1–0.9)
Monocytes: 6 %
Neutrophils Absolute: 4.9 10*3/uL (ref 1.4–7.0)
Neutrophils: 64 %
Platelets: 286 10*3/uL (ref 150–450)
RBC: 3.32 x10E6/uL — ABNORMAL LOW (ref 3.77–5.28)
RDW: 11.8 % (ref 11.7–15.4)
WBC: 7.7 10*3/uL (ref 3.4–10.8)

## 2023-03-22 LAB — TSH: TSH: 1.22 u[IU]/mL (ref 0.450–4.500)

## 2023-03-22 LAB — COMPREHENSIVE METABOLIC PANEL
ALT: 48 IU/L — ABNORMAL HIGH (ref 0–32)
AST: 40 IU/L (ref 0–40)
Albumin: 3.3 g/dL — ABNORMAL LOW (ref 3.8–4.8)
Alkaline Phosphatase: 92 IU/L (ref 44–121)
BUN/Creatinine Ratio: 18 (ref 12–28)
BUN: 20 mg/dL (ref 8–27)
Bilirubin Total: <0.2 mg/dL (ref 0.0–1.2)
CO2: 19 mmol/L — ABNORMAL LOW (ref 20–29)
Calcium: 9.8 mg/dL (ref 8.7–10.3)
Chloride: 111 mmol/L — ABNORMAL HIGH (ref 96–106)
Creatinine, Ser: 1.09 mg/dL — ABNORMAL HIGH (ref 0.57–1.00)
Globulin, Total: 2.7 g/dL (ref 1.5–4.5)
Glucose: 203 mg/dL — ABNORMAL HIGH (ref 70–99)
Potassium: 4.6 mmol/L (ref 3.5–5.2)
Sodium: 141 mmol/L (ref 134–144)
Total Protein: 6 g/dL (ref 6.0–8.5)
eGFR: 53 mL/min/{1.73_m2} — ABNORMAL LOW (ref >59)

## 2023-03-22 LAB — VITAMIN B12: Vitamin B-12: >2000 pg/mL — ABNORMAL HIGH (ref 232–1245)

## 2023-03-22 NOTE — Telephone Encounter (Signed)
 Sarah [name] with Home health PT w/ [facility]  has called for verbal orders for 2 week 5 and 1 week 4 vm secure at [phone]

## 2023-03-22 NOTE — Telephone Encounter (Signed)
 Called and provided the verbal order

## 2023-03-28 ENCOUNTER — Ambulatory Visit (INDEPENDENT_AMBULATORY_CARE_PROVIDER_SITE_OTHER): Payer: Medicare Other

## 2023-03-28 DIAGNOSIS — I6340 Cerebral infarction due to embolism of unspecified cerebral artery: Secondary | ICD-10-CM

## 2023-03-29 ENCOUNTER — Ambulatory Visit (INDEPENDENT_AMBULATORY_CARE_PROVIDER_SITE_OTHER): Payer: Medicare Other

## 2023-03-29 ENCOUNTER — Ambulatory Visit: Admitting: Neurology

## 2023-03-29 DIAGNOSIS — I6340 Cerebral infarction due to embolism of unspecified cerebral artery: Secondary | ICD-10-CM

## 2023-03-29 DIAGNOSIS — R413 Other amnesia: Secondary | ICD-10-CM

## 2023-03-29 DIAGNOSIS — Z8673 Personal history of transient ischemic attack (TIA), and cerebral infarction without residual deficits: Secondary | ICD-10-CM

## 2023-03-29 DIAGNOSIS — R4182 Altered mental status, unspecified: Secondary | ICD-10-CM

## 2023-03-29 NOTE — Addendum Note (Signed)
 Addended by: Geralyn Flash D on: 03/29/2023 03:30 PM   Modules accepted: Orders

## 2023-03-29 NOTE — Progress Notes (Signed)
 Carelink Summary Report / Loop Recorder

## 2023-03-30 ENCOUNTER — Ambulatory Visit (HOSPITAL_COMMUNITY)
Admission: RE | Admit: 2023-03-30 | Discharge: 2023-03-30 | Disposition: A | Discharge status: Home / Self Care | Source: Ambulatory Visit | Attending: Adult Health | Admitting: Adult Health

## 2023-03-30 ENCOUNTER — Encounter: Payer: Self-pay | Admitting: Adult Health

## 2023-03-30 DIAGNOSIS — R413 Other amnesia: Secondary | ICD-10-CM | POA: Insufficient documentation

## 2023-03-30 DIAGNOSIS — Z8673 Personal history of transient ischemic attack (TIA), and cerebral infarction without residual deficits: Secondary | ICD-10-CM | POA: Insufficient documentation

## 2023-03-30 LAB — CUP PACEART REMOTE DEVICE CHECK
Date Time Interrogation Session: 20250317231317
Implantable Pulse Generator Implant Date: 20240618

## 2023-04-01 ENCOUNTER — Telehealth: Payer: Self-pay

## 2023-04-01 NOTE — Telephone Encounter (Signed)
 She can take MiraLAX daily until she has a good bowel movement then every other day. Adequate fiber and fluid intake. Thanks, BJ

## 2023-04-01 NOTE — Telephone Encounter (Signed)
 Copied from CRM [id number]. Topic: General - Other >> Apr 01, 2023  1:49 PM Isabelle Course C wrote: Reason for CRM: Charisse from [facility] stated that Patient has not had a bile movement in 3 days and need medical advice. Her stomach feels tender and is still constipated

## 2023-04-01 NOTE — Telephone Encounter (Signed)
I called and spoke with patient. She is aware of message below & verbalized understanding.

## 2023-04-04 ENCOUNTER — Other Ambulatory Visit: Payer: Self-pay | Admitting: Family Medicine

## 2023-04-04 DIAGNOSIS — E782 Mixed hyperlipidemia: Secondary | ICD-10-CM

## 2023-04-06 ENCOUNTER — Encounter: Payer: Self-pay | Admitting: Cardiovascular Disease

## 2023-04-12 ENCOUNTER — Telehealth: Admitting: Family Medicine

## 2023-04-12 ENCOUNTER — Other Ambulatory Visit: Payer: Self-pay | Admitting: Family Medicine

## 2023-04-12 ENCOUNTER — Encounter: Payer: Self-pay | Admitting: Family Medicine

## 2023-04-12 VITALS — Ht 60.0 in

## 2023-04-12 DIAGNOSIS — F419 Anxiety disorder, unspecified: Secondary | ICD-10-CM

## 2023-04-12 DIAGNOSIS — N182 Chronic kidney disease, stage 2 (mild): Secondary | ICD-10-CM

## 2023-04-12 DIAGNOSIS — I10 Essential (primary) hypertension: Secondary | ICD-10-CM

## 2023-04-12 DIAGNOSIS — E538 Deficiency of other specified B group vitamins: Secondary | ICD-10-CM

## 2023-04-12 DIAGNOSIS — I119 Hypertensive heart disease without heart failure: Secondary | ICD-10-CM | POA: Diagnosis not present

## 2023-04-12 NOTE — Telephone Encounter (Unsigned)
 Copied from CRM [id number]. Topic: Clinical - Medication Refill >> Apr 12, 2023 11:18 AM Alcus Dad wrote: Most Recent Primary Care Visit:  Provider: [place], [name]  Department: [facility]  Visit Type: MYCHART VIDEO VISIT  Date: 10/22/2022  Medication: amLODipine (NORVASC) 5 MG tablet   Has the patient contacted their pharmacy? Yes (Agent: If no, request that the patient contact the pharmacy for the refill. If patient does not wish to contact the pharmacy document the reason why and proceed with request.) (Agent: If yes, when and what did the pharmacy advise?)  Is this the correct pharmacy for this prescription? Yes If no, delete pharmacy and type the correct one.  This is the patient's preferred pharmacy:   [id number] [place], [place] - [address] [place] [address] Phone: [phone] Fax: [phone]    Has the prescription been filled recently? No  Is the patient out of the medication? No  Has the patient been seen for an appointment in the last year OR does the patient have an upcoming appointment? No  Can we respond through MyChart? Yes  Agent: Please be advised that Rx refills may take up to 3 business days. We ask that you follow-up with your pharmacy.

## 2023-04-12 NOTE — Assessment & Plan Note (Signed)
 Recent B12 > 2000. Recommend decreasing B12 supplementation from 1000 mcg daily to 2000 mcg once per week.

## 2023-04-12 NOTE — Assessment & Plan Note (Signed)
 She feels like anxiety has improved, she agrees on trying to decrease dose of sertraline. Recommend alternating sertraline dose every other day between 50 mg and 25 mg for 2 weeks then continue 25 mg daily. Monitor for worsening symptoms.

## 2023-04-12 NOTE — Assessment & Plan Note (Signed)
 She is not monitoring BP at home. Continue Amlodipine 5 mg daily and losartan 50 mg daily as well as low salt diet. Monitor BP at home.

## 2023-04-12 NOTE — Progress Notes (Unsigned)
 Virtual Visit via Video Note I connected with [name] on 04/12/2023 by a video enabled telemedicine application and verified that I am speaking with the correct person using two identifiers. Location patient: home Location provider:work or home office Persons participating in the virtual visit: patient, provider, scribe  I discussed the limitations of evaluation and management by telemedicine and the availability of in person appointments. The patient expressed understanding and agreed to proceed.  Chief Complaint  Patient presents with   Medical Management of Chronic Issues   HPI: Sarah [name]. Sarah [name] is a 78 y.o. female with a PMHx significant for CAD, HTN, CVA, DM II, CKD II, anxiety, PAD, and GERD, who is being seen on video today for neurology follow up.   Patient had an appointment with neurology on 03/14/2023. She had labs including a CBC, liver and kidney function tests, thyroid levels, B12 levels, and blood sugar levels. Also had an EEG, and an MRI.  Her blood sugar was 203 and her ALT was slightly elevated.  She sees endocrinology for her diabetes and has a CGM to check her blood sugar.   She sees her nephrologist every 4 months, and has her next appointment in a few weeks.   Currently taking 1000 mg OTC B12 once daily.   HTN:  She is not checking her BP except at her doctor's appointments.  Currently on amlodipine 5 mg daily and losartan 50 mg daily.  Lab Results  Component Value Date   NA 141 03/17/2023   CL 111 (H) 03/17/2023   K 4.6 03/17/2023   CO2 19 (L) 03/17/2023   BUN 20 03/17/2023   CREATININE 1.09 (H) 03/17/2023   EGFR 53 (L) 03/17/2023   CALCIUM 9.8 03/17/2023   PHOS 3.5 10/14/2022   ALBUMIN 3.3 (L) 03/17/2023   GLUCOSE 203 (H) 03/17/2023   HLD:  Currently on rosuvastatin 20 mg daily and Zetia 10 mg daily Lab Results  Component Value Date   CHOL 139 10/12/2022   HDL 79 10/12/2022   LDLCALC 44 10/12/2022   TRIG 79 10/12/2022   CHOLHDL  1.8 10/12/2022    Anxiety:  Currently on Sertraline 50 mg daily. She believes it is helping with her anxiety and mood.   ROS: See pertinent positives and negatives per HPI.  Past Medical History:  Diagnosis Date   Bilateral carpal tunnel syndrome 03/27/2019   Boils    Glaucoma    Heart murmur    HTN (hypertension)    Hx of adenomatous colonic polyps    Hyperlipidemia    Osteoporosis    Pneumonia    Stroke (HCC)    Type II or unspecified type diabetes mellitus without mention of complication, uncontrolled    Past Surgical History:  Procedure Laterality Date   ABDOMINAL HYSTERECTOMY     CARPAL TUNNEL RELEASE     right   COLONOSCOPY  12-10-10   per Dr. Arlyce Dice, clear, repeat in 7 yrs    CORONARY STENT INTERVENTION N/A 03/11/2020   Procedure: CORONARY STENT INTERVENTION;  Surgeon: Corky Crafts, MD;  Location: [facility] INVASIVE CV LAB;  Service: Cardiovascular;  Laterality: N/A;   CORONARY ULTRASOUND/IVUS N/A 03/11/2020   Procedure: Intravascular Ultrasound/IVUS;  Surgeon: Corky Crafts, MD;  Location: [facility] INVASIVE CV LAB;  Service: Cardiovascular;  Laterality: N/A;   INCISION AND DRAINAGE PERIRECTAL ABSCESS N/A 09/26/2015   Procedure: IRRIGATION AND DEBRIDEMENT PERIRECTAL ABSCESS;  Surgeon: Rodman Pickle, MD;  Location: [facility] OR;  Service: General;  Laterality: N/A;  KNEE ARTHROSCOPY     right   LEFT HEART CATH AND CORONARY ANGIOGRAPHY N/A 03/11/2020   Procedure: LEFT HEART CATH AND CORONARY ANGIOGRAPHY;  Surgeon: Corky Crafts, MD;  Location: [facility] INVASIVE CV LAB;  Service: Cardiovascular;  Laterality: N/A;   LOOP RECORDER INSERTION N/A 10/19/2016   Procedure: LOOP RECORDER INSERTION;  Surgeon: Hillis Range, MD;  Location: [facility] INVASIVE CV LAB;  Service: Cardiovascular;  Laterality: N/A;   ORIF ANKLE FRACTURE Right 03/24/2012   Procedure: OPEN REDUCTION INTERNAL FIXATION (ORIF) ANKLE FRACTURE;  Surgeon: Nadara Mustard, MD;  Location: [facility] OR;  Service: Orthopedics;   Laterality: Right;  Open Reduction Internal Fixation Right Bimalleolar ankle fracture   POLYPECTOMY     TEE WITHOUT CARDIOVERSION N/A 10/18/2016   Procedure: TRANSESOPHAGEAL ECHOCARDIOGRAM (TEE);  Surgeon: Pricilla Riffle, MD;  Location: [facility] ENDOSCOPY;  Service: Cardiovascular;  Laterality: N/A;   TEE WITHOUT CARDIOVERSION N/A 10/14/2022   Procedure: TRANSESOPHAGEAL ECHOCARDIOGRAM;  Surgeon: Lewayne Bunting, MD;  Location: [facility] INVASIVE CV LAB;  Service: Cardiovascular;  Laterality: N/A;   TONSILLECTOMY     Family History  Problem Relation Age of Onset   Diabetes Mother    Hypertension Mother    Stroke Father    Stroke Maternal Uncle    Stroke Paternal Uncle    Colon cancer Neg Hx    Esophageal cancer Neg Hx    Rectal cancer Neg Hx    Stomach cancer Neg Hx    Inflammatory bowel disease Neg Hx    Liver disease Neg Hx    Pancreatic cancer Neg Hx     Social History   Socioeconomic History   Marital status: Single    Spouse name: Not on file   Number of children: Not on file   Years of education: Not on file   Highest education level: Not on file  Occupational History   Not on file  Tobacco Use   Smoking status: Former    Current packs/day: 0.00    Types: Cigarettes    Quit date: 10/15/2016    Years since quitting: 6.4   Smokeless tobacco: Never   Tobacco comments:    smokes occ.   Vaping Use   Vaping status: Never Used  Substance and Sexual Activity   Alcohol use: Not Currently    Comment: occ   Drug use: No   Sexual activity: Not on file  Other Topics Concern   Not on file  Social History Narrative   Caffiene coffee 1 cup in am.   Retired from [employer].     Social Drivers of Corporate investment banker Strain: Low Risk  (05/03/2022)   Overall Financial Resource Strain (CARDIA)    Difficulty of Paying Living Expenses: Not hard at all  Food Insecurity: No Food Insecurity (10/18/2022)   Hunger Vital Sign    Worried About Running Out of Food in the Last  Year: Never true    Ran Out of Food in the Last Year: Never true  Transportation Needs: No Transportation Needs (10/18/2022)   PRAPARE - Administrator, Civil Service (Medical): No    Lack of Transportation (Non-Medical): No  Physical Activity: Insufficiently Active (05/03/2022)   Exercise Vital Sign    Days of Exercise per Week: 2 days    Minutes of Exercise per Session: 10 min  Stress: No Stress Concern Present (05/03/2022)   [employer] of Occupational Health - Occupational Stress Questionnaire    Feeling of Stress : Not  at all  Social Connections: Moderately Integrated (05/03/2022)   Social Connection and Isolation Panel [NHANES]    Frequency of Communication with Friends and Family: More than three times a week    Frequency of Social Gatherings with Friends and Family: More than three times a week    Attends Religious Services: More than 4 times per year    Active Member of [employer] or Organizations: Yes    Attends Banker Meetings: More than 4 times per year    Marital Status: Widowed  Intimate Partner Violence: Not At Risk (10/12/2022)   Humiliation, Afraid, Rape, and Kick questionnaire    Fear of Current or Ex-Partner: No    Emotionally Abused: No    Physically Abused: No    Sexually Abused: No     Current Outpatient Medications:    amLODipine (NORVASC) 5 MG tablet, Take 1 tablet (5 mg total) by mouth daily., Disp: 90 tablet, Rfl: 3   aspirin EC 81 MG tablet, Take 1 tablet (81 mg total) by mouth daily. Swallow whole., Disp: 30 tablet, Rfl: 12   cholecalciferol (VITAMIN D3) 25 MCG (1000 UNIT) tablet, Take 1,000 Units by mouth daily., Disp: , Rfl:    clopidogrel (PLAVIX) 75 MG tablet, Take 1 tablet (75 mg total) by mouth daily. Start AFTER completing the brilinta prescription, Disp: 90 tablet, Rfl: 1   dapagliflozin propanediol (FARXIGA) 10 MG TABS tablet, Take 1 tablet (10 mg total) by mouth daily., Disp: 90 tablet, Rfl: 3   Dulaglutide (TRULICITY) 1.5  MG/0.5ML SOAJ, Inject 1.5 mg into the skin once a week., Disp: 6 mL, Rfl: 3   ezetimibe (ZETIA) 10 MG tablet, Take 1 tablet (10 mg total) by mouth daily., Disp: 90 tablet, Rfl: 3   furosemide (LASIX) 20 MG tablet, Take 1 tablet by mouth daily (only as needed), Disp: 90 tablet, Rfl: 3   insulin glargine, 1 Unit Dial, (TOUJEO SOLOSTAR) 300 UNIT/ML Solostar Pen, Inject 14 Units into the skin daily in the afternoon., Disp: 15 mL, Rfl: 3   insulin lispro (HUMALOG KWIKPEN) 100 UNIT/ML KwikPen, Max daily 30 units (Patient taking differently: Inject 6 Units into the skin 3 (three) times daily. Max daily 30 units), Disp: 30 mL, Rfl: 3   latanoprost (XALATAN) 0.005 % ophthalmic solution, Place 1 drop into both eyes at bedtime., Disp: , Rfl:    losartan (COZAAR) 50 MG tablet, TAKE 1 TABLET BY MOUTH DAILY, Disp: 90 tablet, Rfl: 3   Magnesium 250 MG TABS, Take 250 mg by mouth daily., Disp: , Rfl:    nitroGLYCERIN (NITROSTAT) 0.4 MG SL tablet, Place 1 tablet (0.4 mg total) under the tongue every 5 (five) minutes as needed for chest pain. one tab every 5 minutes up to 3 tablets total over 15 minutes., Disp: 25 tablet, Rfl: 3   ondansetron (ZOFRAN) 4 MG tablet, Take 1 tablet (4 mg total) by mouth 2 (two) times daily as needed for nausea or vomiting., Disp: 60 tablet, Rfl: 1   rosuvastatin (CRESTOR) 20 MG tablet, TAKE 1 TABLET BY MOUTH DAILY, Disp: 90 tablet, Rfl: 3   sertraline (ZOLOFT) 50 MG tablet, TAKE 1 TABLET BY MOUTH DAILY, Disp: 90 tablet, Rfl: 3   topiramate (TOPAMAX) 50 MG tablet, Take 1 tablet (50 mg total) by mouth at bedtime., Disp: 30 tablet, Rfl: 5  EXAM:  VITALS per patient if applicable:  GENERAL: alert, oriented, appears well and in no acute distress  HEENT: atraumatic, conjunctiva clear, no obvious abnormalities on inspection of external  nose and ears  NECK: normal movements of the head and neck  LUNGS: on inspection no signs of respiratory distress, breathing rate appears normal, no  obvious gross SOB, gasping or wheezing  CV: no obvious cyanosis  MS: moves all visible extremities without noticeable abnormality  PSYCH/NEURO: pleasant and cooperative, no obvious depression or anxiety, speech and thought processing grossly intact  ASSESSMENT AND PLAN:  Discussed the following assessment and plan:  Primary hypertension  Hypertension with heart disease  Anxiety disorder, unspecified type  B12 deficiency  Primary hypertension  Hypertension with heart disease Assessment & Plan: She is not monitoring BP at home. Continue Amlodipine 5 mg daily and losartan 50 mg daily as well as low salt diet. Monitor BP at home.   Anxiety disorder, unspecified type Assessment & Plan: She feels like anxiety has improved, she agrees on trying to decrease dose of sertraline. Recommend alternating sertraline dose every other day between 50 mg and 25 mg for 2 weeks then continue 25 mg daily. Monitor for worsening symptoms.   B12 deficiency Assessment & Plan: Recent B12 > 2000. Recommend decreasing B12 supplementation from 1000 mcg daily to 2000 mcg once per week.     We discussed possible serious and likely etiologies, options for evaluation and workup, limitations of telemedicine visit vs in person visit, treatment, treatment risks and precautions. The patient was advised to call back or seek an in-person evaluation if the symptoms worsen or if the condition fails to improve as anticipated. I discussed the assessment and treatment plan with the patient. The patient was provided an opportunity to ask questions and all were answered. The patient agreed with the plan and demonstrated an understanding of the instructions.  No follow-ups on file.  I, Rolla Etienne Wierda, acting as a scribe for Karrin Eisenmenger [place], MD., have documented all relevant documentation on the behalf of Dequante Tremaine [place], MD, as directed by  Everett Ehrler [place], MD while in the presence of Olive Zmuda [place], MD.   I, Margy Sumler  [place], MD, have reviewed all documentation for this visit. The documentation on 04/12/23 for the exam, diagnosis, procedures, and orders are all accurate and complete.  Kambre Messner [place], MD ***

## 2023-04-14 ENCOUNTER — Encounter: Payer: Self-pay | Admitting: Family Medicine

## 2023-04-14 NOTE — Assessment & Plan Note (Signed)
 Last e GFR mildly abnormal. Follows with nephrologist. Stressed the importance of adequate BP and glucose control. Low salt diet, increase water intake,and  continue avoiding NSAID's. Currently on Losartan and [place].

## 2023-04-19 ENCOUNTER — Encounter: Payer: Self-pay | Admitting: Family Medicine

## 2023-04-19 MED ORDER — AMLODIPINE BESYLATE 5 MG PO TABS: 5.0000 mg | ORAL_TABLET | Freq: Every day | ORAL | 3 refills | Status: DC

## 2023-04-20 ENCOUNTER — Encounter: Payer: Self-pay | Admitting: Adult Health

## 2023-04-21 ENCOUNTER — Encounter: Payer: Self-pay | Admitting: Dermatology

## 2023-04-21 ENCOUNTER — Ambulatory Visit: Payer: Medicare Other | Admitting: Dermatology

## 2023-04-21 DIAGNOSIS — L820 Inflamed seborrheic keratosis: Secondary | ICD-10-CM | POA: Diagnosis not present

## 2023-04-21 DIAGNOSIS — L821 Other seborrheic keratosis: Secondary | ICD-10-CM

## 2023-04-21 NOTE — Patient Instructions (Addendum)
 Dear Sarah [name],  Thank you for visiting today. Here is a summary of the key instructions:  Diagnosis: Seborrheic Keratosis  - Treatment:   - Apply a thick layer of plain Vaseline daily to the treated areas for 2-3 weeks   - Cover the treated areas with gauze to keep the Vaseline from rubbing off   - Continue normal showering routine  - Wound Care:   - Keep the treated areas as a soft crust and scab   - Allow the treated areas to fall off naturally (about 2-3 weeks)   - If a large piece is still hanging after 2 months, return for another treatment  - Skin Care:   - Use Amlactin moisturizer (contains lactic acid) to replace your regular moisturizer   - Exfoliate regularly to help slow down the growth of new seborrheic keratoses  - Follow-up:   - Return in 2 months if the treated areas have not fallen off completely  We look forward to seeing the positive changes in your next visit. If you have any questions or concerns before then, please do not hesitate to contact our office.  Warm regards,  Dr. [facility]   Important Information  Due to recent changes in healthcare laws, you may see results of your pathology and/or laboratory studies on MyChart before the doctors have had a chance to review them. We understand that in some cases there may be results that are confusing or concerning to you. Please understand that not all results are received at the same time and often the doctors may need to interpret multiple results in order to provide you with the best plan of care or course of treatment. Therefore, we ask that you please give [place] 2 business days to thoroughly review all your results before contacting the office for clarification. Should we see a critical lab result, you will be contacted sooner.   If You Need Anything After Your Visit  If you have any questions or concerns for your doctor, please call our main line at [phone] If no one answers, please  leave a voicemail as directed and we will return your call as soon as possible. Messages left after 4 pm will be answered the following business day.   You may also send [place] a message via MyChart. We typically respond to MyChart messages within 1-2 business days.  For prescription refills, please ask your pharmacy to contact our office. Our fax number is [phone].  If you have an urgent issue when the clinic is closed that cannot wait until the next business day, you can page your doctor at the number below.    Please note that while we do our best to be available for urgent issues outside of office hours, we are not available 24/7.   If you have an urgent issue and are unable to reach [place], you may choose to seek medical care at your doctor's office, retail clinic, urgent care center, or emergency room.  If you have a medical emergency, please immediately call 911 or go to the emergency department. In the event of inclement weather, please call our main line at [phone] for an update on the status of any delays or closures.  Dermatology Medication Tips: Please keep the boxes that topical medications come in in order to help keep track of the instructions about where and how to use these. Pharmacies typically print the medication instructions only on the boxes and not directly on the medication tubes.  If your medication is too expensive, please contact our office at [phone] or send [place] a message through MyChart.   We are unable to tell what your co-pay for medications will be in advance as this is different depending on your insurance coverage. However, we may be able to find a substitute medication at lower cost or fill out paperwork to get insurance to cover a needed medication.   If a prior authorization is required to get your medication covered by your insurance company, please allow [place] 1-2 business days to complete this process.  Drug prices often vary depending on where the  prescription is filled and some pharmacies may offer cheaper prices.  The website www.goodrx.com contains coupons for medications through different pharmacies. The prices here do not account for what the cost may be with help from insurance (it may be cheaper with your insurance), but the website can give you the price if you did not use any insurance.  - You can print the associated coupon and take it with your prescription to the pharmacy.  - You may also stop by our office during regular business hours and pick up a GoodRx coupon card.  - If you need your prescription sent electronically to a different pharmacy, notify our office through [facility] or by phone at [phone]

## 2023-04-21 NOTE — Progress Notes (Signed)
   New Patient Visit   Subjective  Sarah [name] is a 78 y.o. female who presents for the following: large moles on breasts. Dur: ~20 years. Non tender, denies itching. Dark in color. Getting larger over the years. Would like to discuss treatment options. Has never had these evaluated by a dermatologist. No personal or family Hx of skin cancer.   The patient has spots, moles and lesions to be evaluated, some may be new or changing and the patient may have concern these could be cancer.  This patient is accompanied in the office by her  sister, Sarah [name] .   The following portions of the chart were reviewed this encounter and updated as appropriate: medications, allergies, medical history  Review of Systems:  No other skin or systemic complaints except as noted in HPI or Assessment and Plan.  Objective  Well appearing patient in no apparent distress; mood and affect are within normal limits.  A focused examination was performed of the following areas: Face, chest, abdomen, arms, back  Relevant physical exam findings are noted in the Assessment and Plan.         Assessment & Plan   1. Multiple seborrheic keratoses (some inflamed/irritated) - Assessment: Multiple brown, waxy, stuck-on papules consistent with seborrheic keratoses present for many years on face, back, and under areas of friction. 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 Benign growths with no malignant potential, associated with genetic predisposition and aging. No significant symptoms reported, occasional snagging on clothing.  - Plan:    Cryotherapy with liquid nitrogen applied to two irriated lesions on breast    Apply thick layer of plain Vaseline daily to treated areas for 2-3 weeks    Cover treated areas with gauze to prevent rubbing    Follow-up in 2 months if large pieces of treated lesions persist    Use Amlactin moisturizer for general skin care and potential slowing of new lesion  development    Samples of Amlactin provided to patient  Dermatosis papulosa nigricans  Exam: brown waxy papules on face.   Treatment:  Benign-appearing.  Reassurance/ Observation.  Call clinic for new or changing lesions.  Recommend daily use of broad spectrum spf 30+ sunscreen to sun-exposed areas.   Destruction Procedure Note Destruction method: cryotherapy   Informed consent: discussed and consent obtained   Lesion destroyed using liquid nitrogen: Yes   Outcome: patient tolerated procedure well with no complications   Post-procedure details: wound care instructions given   Locations: right breast and left breast # of Lesions Treated: 2  Prior to procedure, discussed risks of blister formation, small wound, skin dyspigmentation, or rare scar following cryotherapy. Recommend Vaseline ointment to treated areas while healing.      Return if symptoms worsen or fail to improve.  I, Jill Parcell, CMA, am acting as scribe for [employer], DO.   Documentation: I have reviewed the above documentation for accuracy and completeness, and I agree with the above.  Louana Roup, DO

## 2023-04-22 ENCOUNTER — Encounter: Payer: Self-pay | Admitting: *Deleted

## 2023-04-25 ENCOUNTER — Encounter: Payer: Self-pay | Admitting: Adult Health

## 2023-05-01 ENCOUNTER — Other Ambulatory Visit: Payer: Self-pay | Admitting: Internal Medicine

## 2023-05-03 ENCOUNTER — Ambulatory Visit: Payer: Medicare Other

## 2023-05-03 DIAGNOSIS — I6340 Cerebral infarction due to embolism of unspecified cerebral artery: Secondary | ICD-10-CM

## 2023-05-05 LAB — CUP PACEART REMOTE DEVICE CHECK
Date Time Interrogation Session: 20250421231535
Implantable Pulse Generator Implant Date: 20240618

## 2023-05-09 ENCOUNTER — Telehealth: Payer: Self-pay

## 2023-05-09 NOTE — Progress Notes (Deleted)
 Subjective:   Sarah [name] is a 78 y.o. who presents for a Medicare Wellness preventive visit.  Visit Complete: {VISITMETHODVS:2768504410}  {AWVVIDEO:32072}  Persons Participating in Visit: {Persons Participating in Visit:32444}  AWV Questionnaire: {AWVQuestionnaire:32338}        Objective:    There were no vitals filed for this visit. There is no height or weight on file to calculate BMI.     10/14/2022    1:19 PM 10/12/2022   12:52 AM 10/11/2022    6:59 PM 05/03/2022    9:29 AM 11/19/2021    1:11 PM 11/05/2021   10:22 AM 10/24/2021    8:24 PM  Advanced Directives  Does Patient Have a Medical Advance Directive? No  No No No No No  Would patient like information on creating a medical advance directive?  No - Patient declined  No - Patient declined No - Patient declined Yes (MAU/Ambulatory/Procedural Areas - Information given) No - Patient declined    Current Medications (verified) Outpatient Encounter Medications as of 05/09/2023  Medication Sig   amLODipine  (NORVASC ) 5 MG tablet Take 1 tablet (5 mg total) by mouth daily.   aspirin  EC 81 MG tablet Take 1 tablet (81 mg total) by mouth daily. Swallow whole.   cholecalciferol (VITAMIN D3) 25 MCG (1000 UNIT) tablet Take 1,000 Units by mouth daily.   clopidogrel  (PLAVIX ) 75 MG tablet Take 1 tablet (75 mg total) by mouth daily. Start AFTER completing the brilinta  prescription   Continuous Glucose Sensor (DEXCOM G7 SENSOR) MISC USE AS DIRECTED   dapagliflozin  propanediol (FARXIGA ) 10 MG TABS tablet Take 1 tablet (10 mg total) by mouth daily.   Dulaglutide  (TRULICITY ) 1.5 MG/0.5ML SOAJ Inject 1.5 mg into the skin once a week.   ezetimibe  (ZETIA ) 10 MG tablet Take 1 tablet (10 mg total) by mouth daily.   furosemide  (LASIX ) 20 MG tablet Take 1 tablet by mouth daily (only as needed)   insulin  glargine, 1 Unit Dial , (TOUJEO  SOLOSTAR) 300 UNIT/ML Solostar Pen Inject 14 Units into the skin daily in the afternoon.   insulin   lispro (HUMALOG  KWIKPEN) 100 UNIT/ML KwikPen Max daily 30 units (Patient taking differently: Inject 6 Units into the skin 3 (three) times daily. Max daily 30 units)   latanoprost  (XALATAN ) 0.005 % ophthalmic solution Place 1 drop into both eyes at bedtime.   losartan  (COZAAR ) 50 MG tablet TAKE 1 TABLET BY MOUTH DAILY   Magnesium 250 MG TABS Take 250 mg by mouth daily.   nitroGLYCERIN  (NITROSTAT ) 0.4 MG SL tablet Place 1 tablet (0.4 mg total) under the tongue every 5 (five) minutes as needed for chest pain. one tab every 5 minutes up to 3 tablets total over 15 minutes.   ondansetron  (ZOFRAN ) 4 MG tablet Take 1 tablet (4 mg total) by mouth 2 (two) times daily as needed for nausea or vomiting.   rosuvastatin  (CRESTOR ) 20 MG tablet TAKE 1 TABLET BY MOUTH DAILY   sertraline  (ZOLOFT ) 50 MG tablet TAKE 1 TABLET BY MOUTH DAILY   topiramate  (TOPAMAX ) 50 MG tablet Take 1 tablet (50 mg total) by mouth at bedtime.   No facility-administered encounter medications on file as of 05/09/2023.    Allergies (verified) Penicillins   History: Past Medical History:  Diagnosis Date   Bilateral carpal tunnel syndrome 03/27/2019   Boils    Glaucoma    Heart murmur    HTN (hypertension)    Hx of adenomatous colonic polyps    Hyperlipidemia    Osteoporosis  Pneumonia    Stroke (HCC)    Type II or unspecified type diabetes mellitus without mention of complication, uncontrolled    Past Surgical History:  Procedure Laterality Date   ABDOMINAL HYSTERECTOMY     CARPAL TUNNEL RELEASE     right   COLONOSCOPY  12-10-10   per Dr. Arvie Latus, clear, repeat in 7 yrs    CORONARY STENT INTERVENTION N/A 03/11/2020   Procedure: CORONARY STENT INTERVENTION;  Surgeon: Lucendia Rusk, MD;  Location: [facility] INVASIVE CV LAB;  Service: Cardiovascular;  Laterality: N/A;   CORONARY ULTRASOUND/IVUS N/A 03/11/2020   Procedure: Intravascular Ultrasound/IVUS;  Surgeon: Lucendia Rusk, MD;  Location: [facility] INVASIVE CV LAB;  Service:  Cardiovascular;  Laterality: N/A;   INCISION AND DRAINAGE PERIRECTAL ABSCESS N/A 09/26/2015   Procedure: IRRIGATION AND DEBRIDEMENT PERIRECTAL ABSCESS;  Surgeon: Derral Flick, MD;  Location: [facility] OR;  Service: General;  Laterality: N/A;   KNEE ARTHROSCOPY     right   LEFT HEART CATH AND CORONARY ANGIOGRAPHY N/A 03/11/2020   Procedure: LEFT HEART CATH AND CORONARY ANGIOGRAPHY;  Surgeon: Lucendia Rusk, MD;  Location: [facility] INVASIVE CV LAB;  Service: Cardiovascular;  Laterality: N/A;   LOOP RECORDER INSERTION N/A 10/19/2016   Procedure: LOOP RECORDER INSERTION;  Surgeon: Jolly Needle, MD;  Location: [facility] INVASIVE CV LAB;  Service: Cardiovascular;  Laterality: N/A;   ORIF ANKLE FRACTURE Right 03/24/2012   Procedure: OPEN REDUCTION INTERNAL FIXATION (ORIF) ANKLE FRACTURE;  Surgeon: Timothy Ford, MD;  Location: [facility] OR;  Service: Orthopedics;  Laterality: Right;  Open Reduction Internal Fixation Right Bimalleolar ankle fracture   POLYPECTOMY     TEE WITHOUT CARDIOVERSION N/A 10/18/2016   Procedure: TRANSESOPHAGEAL ECHOCARDIOGRAM (TEE);  Surgeon: Elmyra Haggard, MD;  Location: [facility] ENDOSCOPY;  Service: Cardiovascular;  Laterality: N/A;   TEE WITHOUT CARDIOVERSION N/A 10/14/2022   Procedure: TRANSESOPHAGEAL ECHOCARDIOGRAM;  Surgeon: Lenise Quince, MD;  Location: [facility] INVASIVE CV LAB;  Service: Cardiovascular;  Laterality: N/A;   TONSILLECTOMY     Family History  Problem Relation Age of Onset   Diabetes Mother    Hypertension Mother    Stroke Father    Stroke Maternal Uncle    Stroke Paternal Uncle    Colon cancer Neg Hx    Esophageal cancer Neg Hx    Rectal cancer Neg Hx    Stomach cancer Neg Hx    Inflammatory bowel disease Neg Hx    Liver disease Neg Hx    Pancreatic cancer Neg Hx    Social History   Socioeconomic History   Marital status: Single    Spouse name: Not on file   Number of children: Not on file   Years of education: Not on file   Highest education level: Not on file   Occupational History   Not on file  Tobacco Use   Smoking status: Former    Current packs/day: 0.00    Types: Cigarettes    Quit date: 10/15/2016    Years since quitting: 6.5   Smokeless tobacco: Never   Tobacco comments:    smokes occ.   Vaping Use   Vaping status: Never Used  Substance and Sexual Activity   Alcohol  use: Not Currently    Comment: occ   Drug use: No   Sexual activity: Not on file  Other Topics Concern   Not on file  Social History Narrative   Caffiene coffee 1 cup in am.   Retired from [employer].  Social Drivers of Corporate investment banker Strain: Low Risk  (05/03/2022)   Overall Financial Resource Strain (CARDIA)    Difficulty of Paying Living Expenses: Not hard at all  Food Insecurity: No Food Insecurity (10/18/2022)   Hunger Vital Sign    Worried About Running Out of Food in the Last Year: Never true    Ran Out of Food in the Last Year: Never true  Transportation Needs: No Transportation Needs (10/18/2022)   PRAPARE - Administrator, Civil Service (Medical): No    Lack of Transportation (Non-Medical): No  Physical Activity: Insufficiently Active (05/03/2022)   Exercise Vital Sign    Days of Exercise per Week: 2 days    Minutes of Exercise per Session: 10 min  Stress: No Stress Concern Present (05/03/2022)   [employer] of Occupational Health - Occupational Stress Questionnaire    Feeling of Stress : Not at all  Social Connections: Moderately Integrated (05/03/2022)   Social Connection and Isolation Panel [NHANES]    Frequency of Communication with Friends and Family: More than three times a week    Frequency of Social Gatherings with Friends and Family: More than three times a week    Attends Religious Services: More than 4 times per year    Active Member of [employer] or Organizations: Yes    Attends Banker Meetings: More than 4 times per year    Marital Status: Widowed    Tobacco Counseling Counseling  given: Not Answered Tobacco comments: smokes occ.     Clinical Intake:              Lab Results  Component Value Date   HGBA1C 8.5 (A) 03/01/2023   HGBA1C 12.2 (H) 10/12/2022   HGBA1C 8.5 (A) 05/10/2022               Activities of Daily Living ***    10/12/2022   12:58 AM 10/12/2022   12:52 AM  In your present state of health, do you have any difficulty performing the following activities:  Hearing? 0   Vision? 0   Difficulty concentrating or making decisions? 0   Doing errands, shopping?  1    Patient Care Team: [place], [name], MD as PCP - General (Family Medicine) Jann Melody, MD as PCP - Cardiology (Cardiology) Gaylyn Keas Arvis Bison, NP as Nurse Practitioner (Neurology) Magdaline Schools Myles Arvin, [facility] (Inactive) as Pharmacist (Pharmacist) Adriana Albany, LCSW as Social Worker (Licensed Clinical Social Worker) Shamleffer, Julian Obey, MD as Attending Physician (Endocrinology) Lenze, Michele M, PA-C as Physician Assistant (Cardiology) Johny Nap, NP as Nurse Practitioner (Neurology) Baron Border, MD as Attending Physician (Internal Medicine) Carnell Christian, [facility] (Pharmacist)  Indicate any recent Medical Services you may have received from other than [facility] providers in the past year (date may be approximate).     Assessment:   This is a routine wellness examination for Rozan.  Hearing/Vision screen No results found.   Goals Addressed   None    Depression Screen ***    05/03/2022    9:22 AM 02/05/2022    3:12 PM 11/20/2021   12:42 PM 11/02/2021    3:26 PM 10/20/2021    3:37 PM 09/29/2021    2:30 PM 05/26/2021    4:09 PM  PHQ 2/9 Scores  PHQ - 2 Score 0 0 0 2 1 2  0  PHQ- 9 Score   5 8 3 4 1     Fall Risk ***  05/03/2022    9:26 AM 02/05/2022    3:11 PM 11/20/2021   12:42 PM 11/02/2021    3:26 PM 10/20/2021    3:37 PM  Fall Risk   Falls in the past year? 1 0 1 1 1   Number falls in past yr: 0 0 1 0 0  Injury with  Fall? 0 0 0 0 1  Risk for fall due to : No Fall Risks Impaired balance/gait History of fall(s) History of fall(s) History of fall(s)  Follow up Falls prevention discussed Falls evaluation completed Falls evaluation completed Falls evaluation completed Falls evaluation completed    MEDICARE RISK AT HOME: ***    TIMED UP AND GO:  Was the test performed?  {[phone]}  Cognitive Function: {CognitiveScreening:32337}    03/17/2023    2:34 PM  MMSE - Mini Mental State Exam  Orientation to time 5  Orientation to Place 5  Registration 3  Attention/ Calculation 2  Recall 3  Language- name 2 objects 2  Language- repeat 1  Language- follow 3 step command 3  Language- read & follow direction 1  Write a sentence 0  Copy design 0  Total score 25        05/03/2022    9:29 AM 04/29/2021    9:49 AM 04/18/2020    8:50 AM  6CIT Screen  What Year? 0 points 0 points 0 points  What month? 0 points 0 points 0 points  What time? 0 points 0 points 0 points  Count back from 20 0 points 0 points 0 points  Months in reverse 0 points 0 points 0 points  Repeat phrase 0 points 0 points 0 points  Total Score 0 points 0 points 0 points    Immunizations Immunization History  Administered Date(s) Administered   Fluad Quad(high Dose 65+) 10/27/2018, 11/07/2019, 10/20/2021   Fluad Trivalent(High Dose 65+) 10/25/2022   Influenza, High Dose Seasonal PF 11/28/2014, 10/23/2015, 10/27/2016, 12/19/2017   Influenza,inj,Quad PF,6+ Mos 01/19/2013, 09/28/2013   PFIZER(Purple Top)SARS-COV-2 Vaccination 02/05/2019, 02/26/2019, 11/08/2019   Pneumococcal Conjugate-13 03/15/2014   Pneumococcal Polysaccharide-23 07/11/2013    Screening Tests Health Maintenance  Topic Date Due   DTaP/Tdap/Td (1 - Tdap) Never done   Zoster Vaccines- Shingrix (1 of 2) Never done   Diabetic kidney evaluation - Urine ACR  04/26/2020   OPHTHALMOLOGY EXAM  12/24/2021   COVID-19 Vaccine (4 - 2024-25 season) 09/12/2022    Medicare Annual Wellness (AWV)  05/03/2023   MAMMOGRAM  06/18/2023   INFLUENZA VACCINE  08/12/2023   HEMOGLOBIN A1C  08/29/2023   FOOT EXAM  02/29/2024   Diabetic kidney evaluation - eGFR measurement  03/16/2024   Colonoscopy  02/28/2028   Pneumonia Vaccine 67+ Years old  Completed   DEXA SCAN  Completed   Hepatitis C Screening  Completed   HPV VACCINES  Aged Out   Meningococcal B Vaccine  Aged Out    Health Maintenance  Health Maintenance Due  Topic Date Due   DTaP/Tdap/Td (1 - Tdap) Never done   Zoster Vaccines- Shingrix (1 of 2) Never done   Diabetic kidney evaluation - Urine ACR  04/26/2020   OPHTHALMOLOGY EXAM  12/24/2021   COVID-19 Vaccine (4 - 2024-25 season) 09/12/2022   Medicare Annual Wellness (AWV)  05/03/2023   Health Maintenance Items Addressed: {HMMCR (Optional):30011}  Additional Screening:  Vision Screening: Recommended annual ophthalmology exams for early detection of glaucoma and other disorders of the eye.  Dental Screening: Recommended annual dental exams for proper oral  hygiene  Community Resource Referral / Chronic Care Management: CRR required this visit?  {YES/NO:21197}  CCM required this visit?  {CCM Required [phone]}     Plan:     I have personally reviewed and noted the following in the patient's chart:   Medical and social history Use of alcohol , tobacco or illicit drugs  Current medications and supplements including opioid prescriptions. {Opioid Prescriptions:9254456167} Functional ability and status Nutritional status Physical activity Advanced directives List of other physicians Hospitalizations, surgeries, and ER visits in previous 12 months Vitals Screenings to include cognitive, depression, and falls Referrals and appointments  In addition, I have reviewed and discussed with patient certain preventive protocols, quality metrics, and best practice recommendations. A written personalized care plan for preventive  services as well as general preventive health recommendations were provided to patient.     Dewayne Ford, LPN   04/19/8117   After Visit Summary: {CHL AMB AWV After Visit Summary:[phone]}  Notes: {Nurse Notes:32343}

## 2023-05-09 NOTE — Progress Notes (Signed)
 This encounter was created in error - please disregard.

## 2023-05-09 NOTE — Telephone Encounter (Signed)
 Unsuccessful attempts to reach patient on preferred number listed in notes for scheduled AWV. Left message on voicemail okay to reschedule.

## 2023-05-10 ENCOUNTER — Telehealth: Payer: Self-pay

## 2023-05-10 NOTE — Telephone Encounter (Signed)
 Pt is scheduled to come in Friday afternoon.

## 2023-05-10 NOTE — Telephone Encounter (Signed)
 Copied from CRM [id number]. Topic: General - Other >> May 10, 2023  9:47 AM Howard Macho wrote: Reason for CRM: [name] from [facility] called stating the patient has a history of CVA and she did not want to do anything yesterday. [name] just did vitals and education. [name] stated the patient did not want to walk or get up. The patient pain level was a five and she was not doing anything just sitting still. [name] stated her leg was bothering her and it was painful and it was not at a specific time. It hurt day and night. [name] advised the patient to make an appointment but the doctor may want to reach out to the patient to make one  CB [phone]

## 2023-05-12 ENCOUNTER — Other Ambulatory Visit: Payer: Self-pay | Admitting: Adult Health

## 2023-05-12 ENCOUNTER — Encounter: Payer: Self-pay | Admitting: Cardiovascular Disease

## 2023-05-13 ENCOUNTER — Encounter: Payer: Self-pay | Admitting: Family Medicine

## 2023-05-13 ENCOUNTER — Ambulatory Visit: Admitting: Family Medicine

## 2023-05-13 VITALS — BP 140/80 | HR 91 | Temp 98.4°F | Resp 16 | Ht 60.0 in | Wt 103.8 lb

## 2023-05-13 DIAGNOSIS — G259 Extrapyramidal and movement disorder, unspecified: Secondary | ICD-10-CM

## 2023-05-13 DIAGNOSIS — I119 Hypertensive heart disease without heart failure: Secondary | ICD-10-CM | POA: Diagnosis not present

## 2023-05-13 DIAGNOSIS — M79605 Pain in left leg: Secondary | ICD-10-CM

## 2023-05-13 MED ORDER — GABAPENTIN 100 MG PO CAPS
200.0000 mg | ORAL_CAPSULE | Freq: Every day | ORAL | 1 refills | Status: DC
Start: 2023-05-13 — End: 2023-06-10

## 2023-05-13 NOTE — Patient Instructions (Addendum)
 A few things to remember from today's visit:  Pain of left anterior lower extremity - Plan: gabapentin  (NEURONTIN ) 100 MG capsule  Hypertension with heart disease  Abnormal leg movement Pain of leg could be caused by a pinched nerve in your back. If any unusual swelling or redness of this leg you need to seek medical attention. Today we are going to start gabapentin  100 mg at bedtime, you can increase it to 2 capsules in 10 days. I will see you back in 4 to 5 weeks. Fall precautions.  Monitor blood pressures at home.  If you need refills for medications you take chronically, please call your pharmacy. Do not use My Chart to request refills or for acute issues that need immediate attention. If you send a my chart message, it may take a few days to be addressed, specially if I am not in the office.  Please be sure medication list is accurate. If a new problem present, please set up appointment sooner than planned today.

## 2023-05-13 NOTE — Progress Notes (Signed)
 Carelink Summary Report / Loop Recorder

## 2023-05-13 NOTE — Progress Notes (Signed)
 ACUTE VISIT Chief Complaint  Patient presents with   Leg Pain    Left leg pain x1w+   HPI: [name] is a 78 y.o. female with a PMHx significant for CAD, HTN, CVA, DM II, CKD II, anxiety, PAD, and GERD, who is here today complaining of leg pain as described above.   Patient complains of anterior left leg pain for over a week, anterior thigh to pretibial area, sometimes she feels like pain is radiating from her back down her entire her leg.  She says the pain is intermittent, and rates it as an 8/10. It occasionally wakes her up at night.  Also endorses occasional "jumping" sensation in her leg, no associated MS changes.  She believes her physical therapy is making the pain worse.  She has been taking tylenol  for the pain.  Pertinent negatives include fever, LE edema, erythema,numbness, tingling, burning sensation in her leg, recent injuries, surgeries, or extended travel.  No changes in bladder or bowel function.  Her next appointment with neurology is 9/25.   Lumbar MRI from 12/24/2021 for Right lower extremity pain Impression:  1.) No acute findings or clear explanation for the patient's symptoms.  2.) Mild multilevel spondylosis as described. At L3-L4, there is mild narrowing of the lateral recesses and right foramen with potential extraforaminal right L3 nerve root encroachment. No high-grade spinal stenosis or definite nerve root displacement.   BP mildly elevated today. Currently on amlodipine  5 mg daily and losartan  50 mg daily,  She has not been checking her BP at home. Today in the office it is 140/80.  Negative for unusual or severe headache, visual changes, exertional chest pain, dyspnea, focal weakness, or edema.  Lab Results  Component Value Date   CREATININE 1.09 (H) 03/17/2023   BUN 20 03/17/2023   NA 141 03/17/2023   K 4.6 03/17/2023   CL 111 (H) 03/17/2023   CO2 19 (L) 03/17/2023   She has upcoming appointments with cardiology and  nephrology.  Review of Systems  Constitutional:  Negative for appetite change and chills.  HENT:  Negative for sore throat.   Respiratory:  Negative for cough and wheezing.   Gastrointestinal:  Negative for abdominal pain, nausea and vomiting.  Genitourinary:  Negative for decreased urine volume, dysuria and hematuria.  Musculoskeletal:  Positive for gait problem.  Skin:  Negative for rash.  Neurological:  Negative for syncope and facial asymmetry.  See other pertinent positives and negatives in HPI.  Current Outpatient Medications on File Prior to Visit  Medication Sig Dispense Refill   amLODipine  (NORVASC ) 5 MG tablet Take 1 tablet (5 mg total) by mouth daily. 90 tablet 3   aspirin  EC 81 MG tablet Take 1 tablet (81 mg total) by mouth daily. Swallow whole. 30 tablet 12   cholecalciferol (VITAMIN D3) 25 MCG (1000 UNIT) tablet Take 1,000 Units by mouth daily.     clopidogrel  (PLAVIX ) 75 MG tablet Take 1 tablet (75 mg total) by mouth daily. Start AFTER completing the brilinta  prescription 90 tablet 1   Continuous Glucose Sensor (DEXCOM G7 SENSOR) MISC USE AS DIRECTED 9 each 3   dapagliflozin  propanediol (FARXIGA ) 10 MG TABS tablet Take 1 tablet (10 mg total) by mouth daily. 90 tablet 3   Dulaglutide  (TRULICITY ) 1.5 MG/0.5ML SOAJ Inject 1.5 mg into the skin once a week. 6 mL 3   ezetimibe  (ZETIA ) 10 MG tablet Take 1 tablet (10 mg total) by mouth daily. 90 tablet 3   furosemide  (LASIX )  20 MG tablet Take 1 tablet by mouth daily (only as needed) 90 tablet 3   insulin  glargine, 1 Unit Dial , (TOUJEO  SOLOSTAR) 300 UNIT/ML Solostar Pen Inject 14 Units into the skin daily in the afternoon. 15 mL 3   insulin  lispro (HUMALOG  KWIKPEN) 100 UNIT/ML KwikPen Max daily 30 units (Patient taking differently: Inject 6 Units into the skin 3 (three) times daily. Max daily 30 units) 30 mL 3   latanoprost  (XALATAN ) 0.005 % ophthalmic solution Place 1 drop into both eyes at bedtime.     losartan  (COZAAR ) 50 MG  tablet TAKE 1 TABLET BY MOUTH DAILY 90 tablet 3   Magnesium 250 MG TABS Take 250 mg by mouth daily.     nitroGLYCERIN  (NITROSTAT ) 0.4 MG SL tablet Place 1 tablet (0.4 mg total) under the tongue every 5 (five) minutes as needed for chest pain. one tab every 5 minutes up to 3 tablets total over 15 minutes. 25 tablet 3   ondansetron  (ZOFRAN ) 4 MG tablet Take 1 tablet (4 mg total) by mouth 2 (two) times daily as needed for nausea or vomiting. 60 tablet 1   rosuvastatin  (CRESTOR ) 20 MG tablet TAKE 1 TABLET BY MOUTH DAILY 90 tablet 3   sertraline  (ZOLOFT ) 50 MG tablet TAKE 1 TABLET BY MOUTH DAILY 90 tablet 3   topiramate  (TOPAMAX ) 50 MG tablet Take 1 tablet (50 mg total) by mouth at bedtime. 30 tablet 5   No current facility-administered medications on file prior to visit.   Past Medical History:  Diagnosis Date   Bilateral carpal tunnel syndrome 03/27/2019   Boils    Glaucoma    Heart murmur    HTN (hypertension)    Hx of adenomatous colonic polyps    Hyperlipidemia    Osteoporosis    Pneumonia    Stroke (HCC)    Type II or unspecified type diabetes mellitus without mention of complication, uncontrolled    Allergies  Allergen Reactions   Penicillins Other (See Comments)    Does not know reaction   Social History   Socioeconomic History   Marital status: Single    Spouse name: Not on file   Number of children: Not on file   Years of education: Not on file   Highest education level: Associate degree: occupational, Scientist, product/process development, or vocational program  Occupational History   Not on file  Tobacco Use   Smoking status: Former    Current packs/day: 0.00    Types: Cigarettes    Quit date: 10/15/2016    Years since quitting: 6.5   Smokeless tobacco: Never   Tobacco comments:    smokes occ.   Vaping Use   Vaping status: Never Used  Substance and Sexual Activity   Alcohol  use: Not Currently    Comment: occ   Drug use: No   Sexual activity: Not on file  Other Topics Concern   Not on  file  Social History Narrative   Caffiene coffee 1 cup in am.   Retired from [employer].     Social Drivers of Corporate investment banker Strain: Low Risk  (05/09/2023)   Overall Financial Resource Strain (CARDIA)    Difficulty of Paying Living Expenses: Not very hard  Food Insecurity: No Food Insecurity (05/09/2023)   Hunger Vital Sign    Worried About Running Out of Food in the Last Year: Never true    Ran Out of Food in the Last Year: Never true  Transportation Needs: No Transportation Needs (05/09/2023)  PRAPARE - Administrator, Civil Service (Medical): No    Lack of Transportation (Non-Medical): No  Physical Activity: Unknown (05/09/2023)   Exercise Vital Sign    Days of Exercise per Week: 0 days    Minutes of Exercise per Session: Not on file  Stress: Stress Concern Present (05/09/2023)   [employer] of Occupational Health - Occupational Stress Questionnaire    Feeling of Stress : To some extent  Social Connections: Socially Isolated (05/09/2023)   Social Connection and Isolation Panel [NHANES]    Frequency of Communication with Friends and Family: Once a week    Frequency of Social Gatherings with Friends and Family: Once a week    Attends Religious Services: Never    Database administrator or Organizations: No    Attends Banker Meetings: Not on file    Marital Status: Divorced   Vitals:   05/13/23 1509 05/13/23 1540  BP: (!) 140/80 (!) 140/80  Pulse: 91   Resp: 16   Temp: 98.4 F (36.9 C)   SpO2: 98%    Body mass index is 20.27 kg/m.  Physical Exam Vitals and nursing note reviewed.  Constitutional:      General: She is not in acute distress.    Appearance: She is well-developed.  HENT:     Head: Normocephalic and atraumatic.     Mouth/Throat:     Mouth: Mucous membranes are moist.  Eyes:     Conjunctiva/sclera: Conjunctivae normal.  Cardiovascular:     Rate and Rhythm: Normal rate and regular rhythm.     Heart  sounds: No murmur heard.    Comments: DP pulses palpable. No calf tenderness with palpation or left foot dorsiflexion. No erythema. Pulmonary:     Effort: Pulmonary effort is normal. No respiratory distress.     Breath sounds: Normal breath sounds.  Abdominal:     Palpations: Abdomen is soft. There is no mass.     Tenderness: There is no abdominal tenderness.  Musculoskeletal:     Lumbar back: Tenderness present. Positive left straight leg raise test. Negative right straight leg raise test.     Right lower leg: No edema.     Left lower leg: No edema.  Skin:    General: Skin is warm.     Findings: No erythema or rash.  Neurological:     General: No focal deficit present.     Mental Status: She is alert and oriented to person, place, and time.     Comments: Unstable gait assisted with a cane.  Psychiatric:        Mood and Affect: Mood and affect normal.   ASSESSMENT AND PLAN:  Ms. Teer was seen today for leg pain.   Pain of left anterior lower extremity We discussed possible etiologies, the likelihood of pain be caused by a serious process like DVT is low, so I do not think imaging is needed at this time. Instructed to monitor for new symptoms. ? Radicular pain She agrees with trying gabapentin  starting with 100 mg at bedtime, can increase dose of 200 mg in 7 to 10 days if well-tolerated. We discussed some side effects of the medication, fall precautions to continue. Follow-up in 4-5 weeks, before if needed.  -     Gabapentin ; Take 2 capsules (200 mg total) by mouth at bedtime.  Dispense: 60 capsule; Refill: 1  Hypertension with heart disease SBP mild elevated, recommend monitoring BP at home. amlodipine  5 mg daily and  losartan  50 mg daily.  Abnormal leg movement ? RLS. Gabapentin  started today may help. If problem is persistent, she may need to see her neurologist sooner than planned.  I spent a total of 34 minutes in both face to face and non face to face  activities for this visit on the date of this encounter. During this time history was obtained and documented, examination was performed, prior labs/imaging reviewed, and assessment/plan discussed.  Return in about 4 weeks (around 06/10/2023) for leg pain.  I, Fritz Jewel Wierda, acting as a scribe for Lautaro Koral [place], MD., have documented all relevant documentation on the behalf of Orvile Corona [place], MD, as directed by  Gaelle Adriance [place], MD while in the presence of Ameliya Nicotra [place], MD.   I, Maelyn Berrey [place], MD, have reviewed all documentation for this visit. The documentation on 05/13/23 for the exam, diagnosis, procedures, and orders are all accurate and complete.  Alayssa Flinchum G. [place], MD  [facility]. [place] office.

## 2023-05-16 ENCOUNTER — Ambulatory Visit

## 2023-05-16 VITALS — Ht 60.0 in | Wt 103.0 lb

## 2023-05-16 DIAGNOSIS — Z0000 Encounter for general adult medical examination without abnormal findings: Secondary | ICD-10-CM | POA: Diagnosis not present

## 2023-05-16 NOTE — Addendum Note (Signed)
 Addended by: Lott Rouleau A on: 05/16/2023 08:34 AM   Modules accepted: Orders

## 2023-05-16 NOTE — Progress Notes (Signed)
 Subjective:   Sarah [name] is a 78 y.o. who presents for a Medicare Wellness preventive visit.  Visit Complete: Virtual I connected with  Luann Rundle on 05/16/23 by a audio enabled telemedicine application and verified that I am speaking with the correct person using two identifiers.  Patient Location: Home  Provider Location: Home Office  I discussed the limitations of evaluation and management by telemedicine. The patient expressed understanding and agreed to proceed.  Vital Signs: Because this visit was a virtual/telehealth visit, some criteria may be missing or patient reported. Any vitals not documented were not able to be obtained and vitals that have been documented are patient reported.    Persons Participating in Visit: Patient.  AWV Questionnaire: Yes: Patient Medicare AWV questionnaire was completed by the patient on 05/09/23; I have confirmed that all information answered by patient is correct and no changes since this date.  Cardiac Risk Factors include: advanced age (>14men, >39 women);hypertension;diabetes mellitus     Objective:    Today's Vitals   05/16/23 1542  Weight: 103 lb (46.7 kg)  Height: 5' (1.524 m)   Body mass index is 20.12 kg/m.     05/16/2023    4:02 PM 10/14/2022    1:19 PM 10/12/2022   12:52 AM 10/11/2022    6:59 PM 05/03/2022    9:29 AM 11/19/2021    1:11 PM 11/05/2021   10:22 AM  Advanced Directives  Does Patient Have a Medical Advance Directive? No No  No No No No  Would patient like information on creating a medical advance directive? No - Patient declined  No - Patient declined  No - Patient declined No - Patient declined Yes (MAU/Ambulatory/Procedural Areas - Information given)    Current Medications (verified) Outpatient Encounter Medications as of 05/16/2023  Medication Sig   amLODipine  (NORVASC ) 5 MG tablet Take 1 tablet (5 mg total) by mouth daily.   aspirin  EC 81 MG tablet Take 1 tablet (81 mg total) by mouth  daily. Swallow whole.   cholecalciferol (VITAMIN D3) 25 MCG (1000 UNIT) tablet Take 1,000 Units by mouth daily.   clopidogrel  (PLAVIX ) 75 MG tablet Take 1 tablet (75 mg total) by mouth daily. Start AFTER completing the brilinta  prescription   Continuous Glucose Sensor (DEXCOM G7 SENSOR) MISC USE AS DIRECTED   dapagliflozin  propanediol (FARXIGA ) 10 MG TABS tablet Take 1 tablet (10 mg total) by mouth daily.   Dulaglutide  (TRULICITY ) 1.5 MG/0.5ML SOAJ Inject 1.5 mg into the skin once a week.   ezetimibe  (ZETIA ) 10 MG tablet Take 1 tablet (10 mg total) by mouth daily.   furosemide  (LASIX ) 20 MG tablet Take 1 tablet by mouth daily (only as needed)   gabapentin  (NEURONTIN ) 100 MG capsule Take 2 capsules (200 mg total) by mouth at bedtime.   insulin  glargine, 1 Unit Dial , (TOUJEO  SOLOSTAR) 300 UNIT/ML Solostar Pen Inject 14 Units into the skin daily in the afternoon.   insulin  lispro (HUMALOG  KWIKPEN) 100 UNIT/ML KwikPen Max daily 30 units (Patient taking differently: Inject 6 Units into the skin 3 (three) times daily. Max daily 30 units)   latanoprost  (XALATAN ) 0.005 % ophthalmic solution Place 1 drop into both eyes at bedtime.   losartan  (COZAAR ) 50 MG tablet TAKE 1 TABLET BY MOUTH DAILY   Magnesium 250 MG TABS Take 250 mg by mouth daily.   nitroGLYCERIN  (NITROSTAT ) 0.4 MG SL tablet Place 1 tablet (0.4 mg total) under the tongue every 5 (five) minutes as needed for chest pain.  one tab every 5 minutes up to 3 tablets total over 15 minutes.   ondansetron  (ZOFRAN ) 4 MG tablet Take 1 tablet (4 mg total) by mouth 2 (two) times daily as needed for nausea or vomiting.   rosuvastatin  (CRESTOR ) 20 MG tablet TAKE 1 TABLET BY MOUTH DAILY   sertraline  (ZOLOFT ) 50 MG tablet TAKE 1 TABLET BY MOUTH DAILY   topiramate  (TOPAMAX ) 50 MG tablet Take 1 tablet (50 mg total) by mouth at bedtime.   No facility-administered encounter medications on file as of 05/16/2023.    Allergies (verified) Penicillins    History: Past Medical History:  Diagnosis Date   Bilateral carpal tunnel syndrome 03/27/2019   Boils    Glaucoma    Heart murmur    HTN (hypertension)    Hx of adenomatous colonic polyps    Hyperlipidemia    Osteoporosis    Pneumonia    Stroke (HCC)    Type II or unspecified type diabetes mellitus without mention of complication, uncontrolled    Past Surgical History:  Procedure Laterality Date   ABDOMINAL HYSTERECTOMY     CARPAL TUNNEL RELEASE     right   COLONOSCOPY  12-10-10   per Dr. Arvie Latus, clear, repeat in 7 yrs    CORONARY STENT INTERVENTION N/A 03/11/2020   Procedure: CORONARY STENT INTERVENTION;  Surgeon: Lucendia Rusk, MD;  Location: [facility] INVASIVE CV LAB;  Service: Cardiovascular;  Laterality: N/A;   CORONARY ULTRASOUND/IVUS N/A 03/11/2020   Procedure: Intravascular Ultrasound/IVUS;  Surgeon: Lucendia Rusk, MD;  Location: [facility] INVASIVE CV LAB;  Service: Cardiovascular;  Laterality: N/A;   INCISION AND DRAINAGE PERIRECTAL ABSCESS N/A 09/26/2015   Procedure: IRRIGATION AND DEBRIDEMENT PERIRECTAL ABSCESS;  Surgeon: Derral Flick, MD;  Location: [facility] OR;  Service: General;  Laterality: N/A;   KNEE ARTHROSCOPY     right   LEFT HEART CATH AND CORONARY ANGIOGRAPHY N/A 03/11/2020   Procedure: LEFT HEART CATH AND CORONARY ANGIOGRAPHY;  Surgeon: Lucendia Rusk, MD;  Location: [facility] INVASIVE CV LAB;  Service: Cardiovascular;  Laterality: N/A;   LOOP RECORDER INSERTION N/A 10/19/2016   Procedure: LOOP RECORDER INSERTION;  Surgeon: Jolly Needle, MD;  Location: [facility] INVASIVE CV LAB;  Service: Cardiovascular;  Laterality: N/A;   ORIF ANKLE FRACTURE Right 03/24/2012   Procedure: OPEN REDUCTION INTERNAL FIXATION (ORIF) ANKLE FRACTURE;  Surgeon: Timothy Ford, MD;  Location: [facility] OR;  Service: Orthopedics;  Laterality: Right;  Open Reduction Internal Fixation Right Bimalleolar ankle fracture   POLYPECTOMY     TEE WITHOUT CARDIOVERSION N/A 10/18/2016   Procedure: TRANSESOPHAGEAL  ECHOCARDIOGRAM (TEE);  Surgeon: Elmyra Haggard, MD;  Location: [facility] ENDOSCOPY;  Service: Cardiovascular;  Laterality: N/A;   TEE WITHOUT CARDIOVERSION N/A 10/14/2022   Procedure: TRANSESOPHAGEAL ECHOCARDIOGRAM;  Surgeon: Lenise Quince, MD;  Location: [facility] INVASIVE CV LAB;  Service: Cardiovascular;  Laterality: N/A;   TONSILLECTOMY     Family History  Problem Relation Age of Onset   Diabetes Mother    Hypertension Mother    Stroke Father    Stroke Maternal Uncle    Stroke Paternal Uncle    Colon cancer Neg Hx    Esophageal cancer Neg Hx    Rectal cancer Neg Hx    Stomach cancer Neg Hx    Inflammatory bowel disease Neg Hx    Liver disease Neg Hx    Pancreatic cancer Neg Hx    Social History   Socioeconomic History   Marital status: Single    Spouse name:  Not on file   Number of children: Not on file   Years of education: Not on file   Highest education level: Associate degree: occupational, technical, or vocational program  Occupational History   Not on file  Tobacco Use   Smoking status: Former    Current packs/day: 0.00    Types: Cigarettes    Quit date: 10/15/2016    Years since quitting: 6.5   Smokeless tobacco: Never   Tobacco comments:    smokes occ.   Vaping Use   Vaping status: Never Used  Substance and Sexual Activity   Alcohol  use: Not Currently    Comment: occ   Drug use: No   Sexual activity: Not on file  Other Topics Concern   Not on file  Social History Narrative   Caffiene coffee 1 cup in am.   Retired from [employer].     Social Drivers of Corporate investment banker Strain: Low Risk  (05/16/2023)   Overall Financial Resource Strain (CARDIA)    Difficulty of Paying Living Expenses: Not hard at all  Food Insecurity: No Food Insecurity (05/16/2023)   Hunger Vital Sign    Worried About Running Out of Food in the Last Year: Never true    Ran Out of Food in the Last Year: Never true  Transportation Needs: No Transportation Needs (05/16/2023)    PRAPARE - Administrator, Civil Service (Medical): No    Lack of Transportation (Non-Medical): No  Physical Activity: Insufficiently Active (05/16/2023)   Exercise Vital Sign    Days of Exercise per Week: 2 days    Minutes of Exercise per Session: 60 min  Stress: No Stress Concern Present (05/16/2023)   [employer] of Occupational Health - Occupational Stress Questionnaire    Feeling of Stress : Not at all  Recent Concern: Stress - Stress Concern Present (05/09/2023)   [employer] of Occupational Health - Occupational Stress Questionnaire    Feeling of Stress : To some extent  Social Connections: Socially Isolated (05/16/2023)   Social Connection and Isolation Panel [NHANES]    Frequency of Communication with Friends and Family: More than three times a week    Frequency of Social Gatherings with Friends and Family: More than three times a week    Attends Religious Services: Never    Database administrator or Organizations: No    Attends Banker Meetings: Never    Marital Status: Widowed    Tobacco Counseling Counseling given: Not Answered Tobacco comments: smokes occ.     Clinical Intake:  Pre-visit preparation completed: Yes  Pain : No/denies pain     BMI - recorded: 20.12 Nutritional Status: BMI of 19-24  Normal Nutritional Risks: None Diabetes: Yes CBG done?: Yes (CBG 143 per patient) CBG resulted in Enter/ Edit results?: Yes Did pt. bring in CBG monitor from home?: No  Lab Results  Component Value Date   HGBA1C 8.5 (A) 03/01/2023   HGBA1C 12.2 (H) 10/12/2022   HGBA1C 8.5 (A) 05/10/2022     How often do you need to have someone help you when you read instructions, pamphlets, or other written materials from your doctor or pharmacy?: 4 - Often (Sister assist)  Interpreter Needed?: No  Information entered by :: Farris Hong LPN   Activities of Daily Living     05/16/2023    3:55 PM 05/09/2023   12:12 PM  In your present state  of health, do you have any difficulty performing  the following activities:  Hearing? 0 0  Vision? 1 1  Comment Dx: Stroke. Followed by medical attention   Difficulty concentrating or making decisions? 1 1  Comment Dx: Stroke. Followed by medical attention. Sister assist   Walking or climbing stairs? 0 0  Dressing or bathing? 0 0  Doing errands, shopping? 1 1  Comment Sister Advice worker and eating ? Colie Dawes  Comment Aide prepares meals   Using the Toilet? N N  In the past six months, have you accidently leaked urine? Colie Dawes  Comment Wears Depends. Followed by medical attention   Do you have problems with loss of bowel control? N N  Managing your Medications? Colie Dawes  Comment Sister assist   Managing your Finances? Colie Dawes  Comment Sister assist   Housekeeping or managing your Housekeeping? Colie Dawes  Comment Home Aide does houskeeping     Patient Care Team: [place], [name], MD as PCP - General (Family Medicine) Jann Melody, MD as PCP - Cardiology (Cardiology) Raquel Cables, NP as Nurse Practitioner (Neurology) Magdaline Schools Myles Arvin, [facility] (Inactive) as Pharmacist (Pharmacist) Adriana Albany, LCSW as Social Worker (Licensed Clinical Social Worker) Shamleffer, Julian Obey, MD as Attending Physician (Endocrinology) Lenze, Michele M, PA-C as Physician Assistant (Cardiology) Johny Nap, NP as Nurse Practitioner (Neurology) Baron Border, MD as Attending Physician (Internal Medicine) Carnell Christian, [facility] (Pharmacist)  Indicate any recent Medical Services you may have received from other than [facility] providers in the past year (date may be approximate).     Assessment:   This is a routine wellness examination for Tekeya.  Hearing/Vision screen Hearing Screening - Comments:: Denies hearing difficulties   Vision Screening - Comments:: Wears rx glasses - up to date with routine eye exams with  [facility]   Goals Addressed               This Visit's  Progress     Remain active (pt-stated)        I would like to drive again.       Depression Screen      05/16/2023    3:54 PM 05/03/2022    9:22 AM 02/05/2022    3:12 PM 11/20/2021   12:42 PM 11/02/2021    3:26 PM 10/20/2021    3:37 PM 09/29/2021    2:30 PM  PHQ 2/9 Scores  PHQ - 2 Score 0 0 0 0 2 1 2   PHQ- 9 Score    5 8 3 4     Fall Risk      05/16/2023    4:01 PM 05/09/2023   12:12 PM 05/03/2022    9:26 AM 02/05/2022    3:11 PM 11/20/2021   12:42 PM  Fall Risk   Falls in the past year? 1 1 1  0 1  Number falls in past yr: 0 0 0 0 1  Injury with Fall? 0 0 0 0 0  Risk for fall due to : No Fall Risks  No Fall Risks Impaired balance/gait History of fall(s)  Follow up Falls prevention discussed;Falls evaluation completed  Falls prevention discussed Falls evaluation completed Falls evaluation completed    MEDICARE RISK AT HOME:   Medicare Risk at Home Any stairs in or around the home?: Yes If so, are there any without handrails?: Yes Home free of loose throw rugs in walkways, pet beds, electrical cords, etc?: No Adequate lighting in your home to reduce risk of falls?: Yes  Life alert?: No Use of a cane, walker or w/c?: Yes Grab bars in the bathroom?: Yes Shower chair or bench in shower?: No Elevated toilet seat or a handicapped toilet?: Yes  TIMED UP AND GO:  Was the test performed?  No  Cognitive Function: 6CIT completed    03/17/2023    2:34 PM  MMSE - Mini Mental State Exam  Orientation to time 5  Orientation to Place 5  Registration 3  Attention/ Calculation 2  Recall 3  Language- name 2 objects 2  Language- repeat 1  Language- follow 3 step command 3  Language- read & follow direction 1  Write a sentence 0  Copy design 0  Total score 25        05/16/2023    4:02 PM 05/03/2022    9:29 AM 04/29/2021    9:49 AM 04/18/2020    8:50 AM  6CIT Screen  What Year? 0 points 0 points 0 points 0 points  What month? 0 points 0 points 0 points 0 points  What time? 0  points 0 points 0 points 0 points  Count back from 20 0 points 0 points 0 points 0 points  Months in reverse 4 points 0 points 0 points 0 points  Repeat phrase 6 points 0 points 0 points 0 points  Total Score 10 points 0 points 0 points 0 points    Immunizations Immunization History  Administered Date(s) Administered   Fluad Quad(high Dose 65+) 10/27/2018, 11/07/2019, 10/20/2021   Fluad Trivalent(High Dose 65+) 10/25/2022   Influenza, High Dose Seasonal PF 11/28/2014, 10/23/2015, 10/27/2016, 12/19/2017   Influenza,inj,Quad PF,6+ Mos 01/19/2013, 09/28/2013   PFIZER(Purple Top)SARS-COV-2 Vaccination 02/05/2019, 02/26/2019, 11/08/2019   Pneumococcal Conjugate-13 03/15/2014   Pneumococcal Polysaccharide-23 07/11/2013    Screening Tests Health Maintenance  Topic Date Due   DTaP/Tdap/Td (1 - Tdap) Never done   Zoster Vaccines- Shingrix (1 of 2) Never done   Diabetic kidney evaluation - Urine ACR  04/26/2020   COVID-19 Vaccine (4 - 2024-25 season) 09/12/2022   OPHTHALMOLOGY EXAM  12/12/2023 (Originally 12/24/2021)   MAMMOGRAM  06/18/2023   INFLUENZA VACCINE  08/12/2023   HEMOGLOBIN A1C  08/29/2023   FOOT EXAM  02/29/2024   Diabetic kidney evaluation - eGFR measurement  03/16/2024   Medicare Annual Wellness (AWV)  05/15/2024   Colonoscopy  02/28/2028   Pneumonia Vaccine 19+ Years old  Completed   DEXA SCAN  Completed   Hepatitis C Screening  Completed   HPV VACCINES  Aged Out   Meningococcal B Vaccine  Aged Out    Health Maintenance  Health Maintenance Due  Topic Date Due   DTaP/Tdap/Td (1 - Tdap) Never done   Zoster Vaccines- Shingrix (1 of 2) Never done   Diabetic kidney evaluation - Urine ACR  04/26/2020   COVID-19 Vaccine (4 - 2024-25 season) 09/12/2022   Health Maintenance Items Addressed: Eye exam scheduled for 12/12/23  Additional Screening:  Vision Screening: Recommended annual ophthalmology exams for early detection of glaucoma and other disorders of the  eye.  Dental Screening: Recommended annual dental exams for proper oral hygiene  Community Resource Referral / Chronic Care Management: CRR required this visit?  No   CCM required this visit?  No     Plan:     I have personally reviewed and noted the following in the patient's chart:   Medical and social history Use of alcohol , tobacco or illicit drugs  Current medications and supplements including opioid prescriptions. Patient is not currently  taking opioid prescriptions. Functional ability and status Nutritional status Physical activity Advanced directives List of other physicians Hospitalizations, surgeries, and ER visits in previous 12 months Vitals Screenings to include cognitive, depression, and falls Referrals and appointments  In addition, I have reviewed and discussed with patient certain preventive protocols, quality metrics, and best practice recommendations. A written personalized care plan for preventive services as well as general preventive health recommendations were provided to patient.     Dewayne Ford, LPN   08/11/1910   After Visit Summary: (MyChart) Due to this being a telephonic visit, the after visit summary with patients personalized plan was offered to patient via MyChart   Notes: Nothing significant to report at this time.

## 2023-05-16 NOTE — Patient Instructions (Addendum)
 Sarah [name] , Thank you for taking time to come for your Medicare Wellness Visit. I appreciate your ongoing commitment to your health goals. Please review the following plan we discussed and let me know if I can assist you in the future.   Referrals/Orders/Follow-Ups/Clinician Recommendations:   This is a list of the screening recommended for you and due dates:  Health Maintenance  Topic Date Due   DTaP/Tdap/Td vaccine (1 - Tdap) Never done   Zoster (Shingles) Vaccine (1 of 2) Never done   Yearly kidney health urinalysis for diabetes  04/26/2020   COVID-19 Vaccine (4 - 2024-25 season) 09/12/2022   Eye exam for diabetics  12/12/2023*   Mammogram  06/18/2023   Flu Shot  08/12/2023   Hemoglobin A1C  08/29/2023   Complete foot exam   02/29/2024   Yearly kidney function blood test for diabetes  03/16/2024   Medicare Annual Wellness Visit  05/15/2024   Colon Cancer Screening  02/28/2028   Pneumonia Vaccine  Completed   DEXA scan (bone density measurement)  Completed   Hepatitis C Screening  Completed   HPV Vaccine  Aged Out   Meningitis B Vaccine  Aged Out  *Topic was postponed. The date shown is not the original due date.    Advanced directives: (Declined) Advance directive discussed with you today. Even though you declined this today, please call our office should you change your mind, and we can give you the proper paperwork for you to fill out.  Next Medicare Annual Wellness Visit scheduled for next year: Yes  Have you seen your provider in the last 6 months (3 months if uncontrolled diabetes)? Yes

## 2023-05-16 NOTE — Progress Notes (Signed)
 Carelink Summary Report / Loop Recorder

## 2023-05-18 ENCOUNTER — Telehealth: Payer: Self-pay | Admitting: Family Medicine

## 2023-05-18 DIAGNOSIS — M545 Low back pain, unspecified: Secondary | ICD-10-CM

## 2023-05-18 DIAGNOSIS — M79605 Pain in left leg: Secondary | ICD-10-CM

## 2023-05-18 DIAGNOSIS — I639 Cerebral infarction, unspecified: Secondary | ICD-10-CM

## 2023-05-18 NOTE — Telephone Encounter (Signed)
 Copied from CRM [id number]. Topic: Referral - Request for Referral >> May 18, 2023  4:34 PM Luane Rumps D wrote: Did the patient discuss referral with their provider in the last year? Yes (If No - schedule appointment) (If Yes - send message)  Appointment offered? Yes  Type of order/referral and detailed reason for visit: Charri with [facility] calling to refer Ms. Largent to out patient physical therapy as she is being discharged today.  Preference of office, provider, location: [facility]  If referral order, have you been seen by this specialty before? Yes (If Yes, this issue or another issue? When? Where?  Can we respond through MyChart? No

## 2023-05-18 NOTE — Telephone Encounter (Signed)
 Referral entered

## 2023-05-28 ENCOUNTER — Encounter: Payer: Self-pay | Admitting: Family Medicine

## 2023-05-30 ENCOUNTER — Ambulatory Visit: Payer: Medicare Other | Attending: Internal Medicine | Admitting: Internal Medicine

## 2023-05-30 VITALS — BP 130/60 | HR 86 | Ht 60.0 in | Wt 98.4 lb

## 2023-05-30 DIAGNOSIS — I25118 Atherosclerotic heart disease of native coronary artery with other forms of angina pectoris: Secondary | ICD-10-CM | POA: Diagnosis not present

## 2023-05-30 DIAGNOSIS — I6340 Cerebral infarction due to embolism of unspecified cerebral artery: Secondary | ICD-10-CM

## 2023-05-30 DIAGNOSIS — R011 Cardiac murmur, unspecified: Secondary | ICD-10-CM

## 2023-05-30 DIAGNOSIS — I10 Essential (primary) hypertension: Secondary | ICD-10-CM | POA: Diagnosis not present

## 2023-05-30 NOTE — Patient Instructions (Signed)
 Medication Instructions:  Your physician recommends that you continue on your current medications as directed. Please refer to the Current Medication list given to you today.  *If you need a refill on your cardiac medications before your next appointment, please call your pharmacy*  Lab Work: NONE  If you have labs (blood work) drawn today and your tests are completely normal, you will receive your results only by: MyChart Message (if you have MyChart) OR A paper copy in the mail If you have any lab test that is abnormal or we need to change your treatment, we will call you to review the results.  Testing/Procedures: FALL 2025- - - Your physician has requested that you have an echocardiogram. Echocardiography is a painless test that uses sound waves to create images of your heart. It provides your doctor with information about the size and shape of your heart and how well your heart's chambers and valves are working. This procedure takes approximately one hour. There are no restrictions for this procedure. Please do NOT wear cologne, perfume, aftershave, or lotions (deodorant is allowed). Please arrive 15 minutes prior to your appointment time.  Please note: We ask at that you not bring children with you during ultrasound (echo/ vascular) testing. Due to room size and safety concerns, children are not allowed in the ultrasound rooms during exams. Our front office staff cannot provide observation of children in our lobby area while testing is being conducted. An adult accompanying a patient to their appointment will only be allowed in the ultrasound room at the discretion of the ultrasound technician under special circumstances. We apologize for any inconvenience.   Follow-Up: At [facility], you and your health needs are our priority.  As part of our continuing mission to provide you with exceptional heart care, our providers are all part of one team.  This team includes your primary  Cardiologist (physician) and Advanced Practice Providers or APPs (Physician Assistants and Nurse Practitioners) who all work together to provide you with the care you need, when you need it.  Your next appointment:   12 month(s)  Provider:   Jann Melody, MD

## 2023-05-30 NOTE — Progress Notes (Signed)
 Cardiology Office Note:  .    Date:  05/30/2023  ID:  Sarah [name], DOB [birth year], MRN [id number] PCP: [name], Sarah G, MD  [facility] Providers Cardiologist:  Sarah Melody, MD Cardiology APP:  Sarah Hummingbird, PA-C     CC: follow up stroke  History of Present Illness: .    Sarah [name] is a 78 y.o. female  with a history of stroke who presents for follow-up after a recent stroke.  She has a history of multiple strokes, with the most recent occurring in 2023. She feels 'really tired' and is currently undergoing physical therapy. Initially, she had in-home therapy twice a week, but has now transitioned to using machines at a facility.  She has a history of coronary artery disease and underwent a percutaneous coronary intervention (PCI) of the right coronary artery (RCA) system. No recent chest discomfort and no significant palpitations, although she occasionally experiences them. Her breathing is okay.  Her medication regimen includes aspirin  and Plavix . She has experienced unintentional weight loss, which she attributes to a recent change in medication for leg issues, possibly 'copitenta'.  There is no evidence of atrial fibrillation or flutter, as confirmed by an implantable recorder. 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  Discussed the use of AI scribe software for clinical note transcription with the patient, who gave verbal consent to proceed.   Relevant histories: .  Social  - established with me 2022 ROS: As per HPI.   Studies Reviewed: .     Cardiac Studies & Procedures   ______________________________________________________________________________________________ CARDIAC CATHETERIZATION  CARDIAC CATHETERIZATION 03/11/2020  Conclusion  Prox RCA lesion is 50% stenosed.  Mid RCA lesion is 90% stenosed.  A drug-eluting stent was successfully  placed using a STENT RESOLUTE ONYX 3.0X30, post dilated to 3.75 mm, and optimized with IVUS.  Post intervention, there is a 0% residual stenosis.  Prox Cx lesion is 50% stenosed. This looks more significant in the caudal views. CT FFR of the circumflex was negative.  Mid Cx lesion is 25% stenosed.  Prox LAD lesion is 25% stenosed.  The left ventricular systolic function is normal.  LV end diastolic pressure is normal. LVEDP 14 mm Hg.  The left ventricular ejection fraction is 55-65% by visual estimate.  There is no aortic valve stenosis.  Successful PCI of the RCA.  Moderate disease in the proximal circumflex, if she had persistent symptoms, could reevaluate this vessel.  Severe bend in the LAD which may have affected CT FFR calculation.  I discussed the findings with the patient's sister.  Continue dual antiplatelet therapy along with aggressive secondary prevention.  Plan for same-day discharge.  Findings Coronary Findings Diagnostic  Dominance: Right  Left Anterior Descending Prox LAD lesion is 25% stenosed.  Left Circumflex Prox Cx lesion is 50% stenosed. The lesion is eccentric. Mid Cx lesion is 25% stenosed.  Right Coronary Artery Prox RCA lesion is 50% stenosed. Mid RCA lesion is 90% stenosed. The lesion is eccentric, irregular and ulcerative.  Intervention  Mid RCA lesion Stent CATH VISTA GUIDE 6FR XBRCA guide catheter was inserted. Lesion crossed with guidewire using a WIRE ASAHI PROWATER 180CM. Pre-stent angioplasty was performed using a BALLOON SAPPHIRE 2.5X20. A drug-eluting stent was successfully placed using a STENT RESOLUTE ONYX 3.0X30. Stent strut is well apposed. Post-stent angioplasty was performed using a BALLOON SAPPHIRE [place] [id number]. IVUS revealed that proximally, the vessel appeared to be 4.0 mm and  more distally, it appeared to be about a 3.5 mm vessel. Post-Intervention Lesion Assessment The intervention was successful. Pre-interventional TIMI flow  is 3. Post-intervention TIMI flow is 3. Ultrasound (IVUS) was performed on the lesion post PCI. Stent well apposed. There is a 0% residual stenosis post intervention.     ECHOCARDIOGRAM  ECHOCARDIOGRAM COMPLETE 10/13/2022  Narrative ECHOCARDIOGRAM REPORT    Patient Name:   Sarah [name] Date of Exam: 10/13/2022 Medical Rec #:  [id number]            Height:       60.0 in Accession #:    [id number]           Weight:       112.4 lb Date of Birth:  Jun 28, 1945            BSA:          1.461 m Patient Age:    77 years             BP:           108/80 mmHg Patient Gender: F                    HR:           77 bpm. Exam Location:  Inpatient  Procedure: 2D Echo, Cardiac Doppler and Color Doppler  Indications:    Stroke I63.9  History:        Patient has prior history of Echocardiogram examinations, most recent 10/24/2021. Hypertrophic Cardiomyopathy, CAD, Abnormal ECG, Carotid Disease; Risk Factors:Diabetes, Dyslipidemia, Hypertension and Former Smoker.  Sonographer:    Adelia Homestead RVT RCS Referring Phys: [id number] JONATHAN SEGARS  IMPRESSIONS   1. Left ventricular ejection fraction, by estimation, is 65 to 70%. The left ventricle has normal function. The left ventricle has no regional wall motion abnormalities. There is mild concentric left ventricular hypertrophy. Left ventricular diastolic parameters are consistent with Grade I diastolic dysfunction (impaired relaxation). 2. Right ventricular systolic function is normal. The right ventricular size is normal. 3. Left atrial size was mildly dilated. 4. The mitral valve is normal in structure. Trivial mitral valve regurgitation. No evidence of mitral stenosis. 5. There is a hyperechoic mass on the left coronary cusp measuring 4 x 7 mm. It has a very short and fairly broad attachment to the leaflet and is only slightly mobile. Diffierential diagnosis is broad and includes healed vegetation, atypical myxoma, papillary fibroelastoma  and even degenerative change-related nodule. The aortic valve is tricuspid. There is mild thickening of the aortic valve. Aortic valve regurgitation is trivial. Aortic valve sclerosis/calcification is present, without any evidence of aortic stenosis. 6. The inferior vena cava is normal in size with greater than 50% respiratory variability, suggesting right atrial pressure of 3 mmHg.  Comparison(s): A prior study was performed on 10/24/2021. No significant change from prior study. Prior images reviewed side by side. A similar abnormality was seen on the left cusp of the aortic valve. It was not seen on TEE from 2018.  Conclusion(s)/Recommendation(s): Recommend TEE for evaluation of the aortic valve abnormality.  FINDINGS Left Ventricle: Left ventricular ejection fraction, by estimation, is 65 to 70%. The left ventricle has normal function. The left ventricle has no regional wall motion abnormalities. The left ventricular internal cavity size was normal in size. There is mild concentric left ventricular hypertrophy. Left ventricular diastolic parameters are consistent with Grade I diastolic dysfunction (impaired relaxation).  Right Ventricle: The right ventricular size is normal. No increase  in right ventricular wall thickness. Right ventricular systolic function is normal.  Left Atrium: Left atrial size was mildly dilated.  Right Atrium: Right atrial size was normal in size.  Pericardium: There is no evidence of pericardial effusion.  Mitral Valve: The mitral valve is normal in structure. Trivial mitral valve regurgitation. No evidence of mitral valve stenosis.  Tricuspid Valve: The tricuspid valve is normal in structure. Tricuspid valve regurgitation is mild.  Aortic Valve: There is a hyperechoic mass on the left coronary cusp measuring 4 x 7 mm. It has a very short and fairly broad attachment to the leaflet and is only slightly mobile. Diffierential diagnosis is broad and includes healed  vegetation, atypical myxoma, papillary fibroelastoma and even degenerative change-related nodule. The aortic valve is tricuspid. There is mild thickening of the aortic valve. Aortic valve regurgitation is trivial. Aortic valve sclerosis/calcification is present, without any evidence of aortic stenosis. Aortic valve mean gradient measures 6.0 mmHg. Aortic valve peak gradient measures 11.0 mmHg. Aortic valve area, by VTI measures 2.26 cm.  Pulmonic Valve: The pulmonic valve was normal in structure. Pulmonic valve regurgitation is not visualized. No evidence of pulmonic stenosis.  Aorta: The aortic root and ascending aorta are structurally normal, with no evidence of dilitation.  Venous: The inferior vena cava is normal in size with greater than 50% respiratory variability, suggesting right atrial pressure of 3 mmHg.  IAS/Shunts: No atrial level shunt detected by color flow Doppler.   LEFT VENTRICLE PLAX 2D LVIDd:         3.20 cm   Diastology LVIDs:         2.00 cm   LV e' medial:    5.44 cm/s LV PW:         1.20 cm   LV E/e' medial:  17.2 LV IVS:        1.20 cm   LV e' lateral:   8.92 cm/s LVOT diam:     1.70 cm   LV E/e' lateral: 10.5 LV SV:         71 LV SV Index:   48 LVOT Area:     2.27 cm   RIGHT VENTRICLE             IVC RV S prime:     16.10 cm/s  IVC diam: 1.30 cm TAPSE (M-mode): 2.1 cm  LEFT ATRIUM             Index        RIGHT ATRIUM          Index LA diam:        3.70 cm 2.53 cm/m   RA Area:     8.92 cm LA Vol (A2C):   45.2 ml 30.93 ml/m  RA Volume:   18.20 ml 12.45 ml/m LA Vol (A4C):   37.5 ml 25.66 ml/m LA Biplane Vol: 42.1 ml 28.81 ml/m AORTIC VALVE                     PULMONIC VALVE AV Area (Vmax):    2.20 cm      PV Vmax:       1.07 m/s AV Area (Vmean):   2.25 cm      PV Peak grad:  4.6 mmHg AV Area (VTI):     2.26 cm AV Vmax:           166.00 cm/s AV Vmean:          112.000 cm/s AV VTI:  0.312 m AV Peak Grad:      11.0 mmHg AV Mean Grad:       6.0 mmHg LVOT Vmax:         161.00 cm/s LVOT Vmean:        111.000 cm/s LVOT VTI:          0.311 m LVOT/AV VTI ratio: 1.00  AORTA Ao Root diam: 2.80 cm Ao Asc diam:  3.00 cm  MITRAL VALVE                TRICUSPID VALVE MV Area (PHT): 3.77 cm     TR Peak grad:   28.1 mmHg MV Decel Time: 201 msec     TR Vmax:        265.00 cm/s MV E velocity: 93.60 cm/s MV A velocity: 112.00 cm/s  SHUNTS MV E/A ratio:  0.84         Systemic VTI:  0.31 m Systemic Diam: 1.70 cm  Luana Rumple MD Electronically signed by Luana Rumple MD Signature Date/Time: 10/13/2022/6:14:21 PM    Final   TEE  ECHO TEE 10/14/2022  Narrative TRANSESOPHOGEAL ECHO REPORT    Patient Name:   LASSIE DEMOREST Date of Exam: 10/14/2022 Medical Rec #:  [id number]            Height:       60.0 in Accession #:    [id number]           Weight:       112.4 lb Date of Birth:  07/05/1945            BSA:          1.461 m Patient Age:    77 years             BP:           176/70 mmHg Patient Gender: F                    HR:           92 bpm. Exam Location:  Inpatient  Procedure: Transesophageal Echo, 3D Echo, Cardiac Doppler and Color Doppler  Indications:     I35.8 Other nonrheumatic aortic valve disorders. Stroke  History:         Patient has prior history of Echocardiogram examinations, most recent 10/13/2022. CHF, CAD, Abnormal ECG, Stroke, Signs/Symptoms:Dizziness/Lightheadedness; Risk Factors:Diabetes, Dyslipidemia and Former Smoker.  Sonographer:     Raynelle Callow RDCS Referring Phys:  [id number] BRIAN S CRENSHAW Diagnosing Phys: Alexandria Angel MD  PROCEDURE: After discussion of the risks and benefits of a TEE, an informed consent was obtained from the patient. The transesophogeal probe was passed without difficulty through the esophogus of the patient. Imaged were obtained with the patient in a left lateral decubitus position. Sedation performed by different physician. The patient was monitored while under  deep sedation. Anesthestetic sedation was provided intravenously by Anesthesiology: 120mg  of Propofol . The patient's vital signs; including heart rate, blood pressure, and oxygen saturation; remained stable throughout the procedure. The patient developed no complications during the procedure.  IMPRESSIONS   1. Nodular density noted on left coronary cusp (possible nodular calcification); mobile density on right coronary cusp (vegetation vs lambl's excrescenc; less likely fibroelastoma). 2. Left ventricular ejection fraction, by estimation, is 60 to 65%. The left ventricle has normal function. There is moderate concentric left ventricular hypertrophy. 3. Right ventricular systolic function is normal. The right ventricular size is normal. 4. No left atrial/left atrial  appendage thrombus was detected. The LAA emptying velocity was 77 cm/s. 5. The mitral valve is normal in structure. Trivial mitral valve regurgitation. No evidence of mitral stenosis. 6. Mobile echodensity present right coronary cusp; nodular density present left coronary cusp.. The aortic valve is abnormal. Aortic valve regurgitation is trivial. No aortic stenosis is present. 7. The pulmonic valve was abnormal. 8. There is Moderate (Grade III) plaque involving the descending aorta. 9. Agitated saline contrast bubble study was negative, with no evidence of any interatrial shunt.  FINDINGS Left Ventricle: Left ventricular ejection fraction, by estimation, is 60 to 65%. The left ventricle has normal function. The left ventricular internal cavity size was normal in size. There is moderate concentric left ventricular hypertrophy.  Right Ventricle: The right ventricular size is normal. Right ventricular systolic function is normal.  Left Atrium: Left atrial size was normal in size. No left atrial/left atrial appendage thrombus was detected. The LAA emptying velocity was 77 cm/s.  Right Atrium: Right atrial size was normal in  size.  Pericardium: Trivial pericardial effusion is present.  Mitral Valve: The mitral valve is normal in structure. Trivial mitral valve regurgitation. No evidence of mitral valve stenosis.  Tricuspid Valve: The tricuspid valve is normal in structure. Tricuspid valve regurgitation is trivial. No evidence of tricuspid stenosis.  Aortic Valve: Mobile echodensity present right coronary cusp; nodular density present left coronary cusp. The aortic valve is abnormal. Aortic valve regurgitation is trivial. No aortic stenosis is present.  Pulmonic Valve: The pulmonic valve was abnormal. Pulmonic valve regurgitation is trivial. No evidence of pulmonic stenosis.  Aorta: The aortic root and ascending aorta are structurally normal, with no evidence of dilitation. There is moderate (Grade III) plaque involving the descending aorta.  IAS/Shunts: No atrial level shunt detected by color flow Doppler. Agitated saline contrast was given intravenously to evaluate for intracardiac shunting. Agitated saline contrast bubble study was negative, with no evidence of any interatrial shunt.  Additional Comments: Nodular density noted on left coronary cusp (possible nodular calcification); mobile density on right coronary cusp (vegetation vs lambl's excrescenc; less likely fibroelastoma). Spectral Doppler performed.   AORTA Ao Asc diam: 2.80 cm  Alexandria Angel MD Electronically signed by Alexandria Angel MD Signature Date/Time: 10/14/2022/3:02:34 PM    Final  MONITORS  CARDIAC EVENT MONITOR 11/04/2021   CT SCANS  CT CORONARY FRACTIONAL FLOW RESERVE DATA PREP 02/28/2020  Narrative EXAM: FFRCT ANALYSIS 78 year old with abnormal coronary CT  FINDINGS: FFRct analysis was performed on the original cardiac CT angiogram dataset. Diagrammatic representation of the FFRct analysis is provided in a separate PDF document in PACS. This dictation was created using the PDF document and an interactive 3D model of  the results. 3D model is not available in the EMR/PACS. Normal FFR range is >0.80.  1. Left Main: Normal  2. LAD: Proximal - 0.95, Mid - 0.87, Distal 0.71 - Abnormal  3. LCX: Proximal - 0.98, Mid 0.89, Distal 0.84 - Normal  4. Ramus: n/a  5. RCA: Proximal 0.99, Mid 0.51, Distal <0.50 - Abnormal  IMPRESSION: 1.  Abnormal FFR of RCA verifying severe stenosis in mid segment.  2. Abnormal FFR of Distal LAD (may represent smaller caliber distal vessel).  3.  Recommend cardiac catheterization.  Dorothye Gathers, MD [facility].  Note: These examples are not recommendations of [employer] and only provided as examples of what other customers are doing.   Electronically Signed By: Dorothye Gathers MD On: 02/29/2020 07:05   CT CORONARY MORPH W/CTA COR  W/SCORE 02/28/2020  Addendum 02/28/2020  5:09 PM ADDENDUM REPORT: 02/28/2020 17:06  CLINICAL DATA:  78 year old female with chest pain.  EXAM: Cardiac/Coronary  CTA  TECHNIQUE: The patient was scanned on a [employer].  FINDINGS: A 120 kV prospective scan was triggered in the descending thoracic aorta at 111 HU's. Axial non-contrast 3 mm slices were carried out through the heart. The data set was analyzed on a dedicated work station and scored using the Agatson method. Gantry rotation speed was 250 msecs and collimation was .6 mm. Beta blockade and 0.8 mg of sl NTG was given. The 3D data set was reconstructed in 5% intervals of the 67-82 % of the R-R cycle. Diastolic phases were analyzed on a dedicated work station using MPR, MIP and VRT modes. The patient received 80 cc of contrast.  Aorta: Normal size. Descending aortic atherosclerosis. No dissection.  Aortic Valve:  Trileaflet.  Mild focal calcification.  Coronary Arteries:  Normal coronary origin.  Right dominance.  RCA is a large dominant artery that gives rise to PDA and PLA. There is mid vessel sequential soft plaque, 75-99% stenosis.  Left main is a large artery  that gives rise to LAD and LCX arteries.  LAD is a large vessel that has spotty calcification in the proximal segment 0-24%, calcified plaque.  LCX is a non-dominant artery that gives rise to one large OM1 branch. There is proximal soft plaque 50% with a secondary mid lesion 50-69%.  Other findings:  Normal pulmonary vein drainage into the left atrium.  Normal left atrial appendage without a thrombus.  Normal size of the pulmonary artery.  Please see radiology report for non cardiac findings.  IMPRESSION: 1. Coronary calcium  score of 25. This was 41 percentile for age and sex matched control.  2. Normal coronary origin with right dominance.  3. Multivessel CAD - mild LAD disease, moderate to severe stenosis of Circumflex and RCA. Sending for FFR analysis.  4.  Aortic atherosclerosis descending.  Dorothye Gathers, MD [facility]   Electronically Signed By: Dorothye Gathers MD On: 02/28/2020 17:06  Narrative EXAM: OVER-READ INTERPRETATION  CT CHEST  The following report is an over-read performed by radiologist Dr. Janeece Mechanic of [facility] on 02/28/2020. This over-read does not include interpretation of cardiac or coronary anatomy or pathology. The coronary CTA interpretation by the cardiologist is attached.  COMPARISON:  09/26/2011  FINDINGS: Vascular: Scattered aortic calcifications. No evidence of aortic aneurysm. Heart is normal size.  Mediastinum/Nodes: No adenopathy  Lungs/Pleura: No confluent opacities or effusions.  Upper Abdomen: Imaging into the upper abdomen demonstrates no acute findings.  Musculoskeletal: Chest wall soft tissues are unremarkable. No acute bony abnormality.  IMPRESSION: No acute extra cardiac abnormality.  Aortic atherosclerosis.  Electronically Signed: By: Janeece Mechanic M.D. On: 02/28/2020 15:58     ______________________________________________________________________________________________       Physical Exam:     VS:  BP 130/60 (BP Location: Right Arm)   Pulse 86   Ht 5' (1.524 m)   Wt 98 lb 6.4 oz (44.6 kg)   SpO2 96%   BMI 19.22 kg/m    Wt Readings from Last 3 Encounters:  05/30/23 98 lb 6.4 oz (44.6 kg)  05/16/23 103 lb (46.7 kg)  05/13/23 103 lb 12.8 oz (47.1 kg)    Gen: no distress   Neck: No JVD Cardiac: No Rubs or Gallops, no Murmur, RRR +2 radial pulses Respiratory: Clear to auscultation bilaterally, normal effort, normal  respiratory rate GI:  Soft, nontender, non-distended  MS: No  edema;  moves all extremities Integument: Skin feels warm Neuro:  At time of evaluation, alert and oriented to person/place/time/situation  Psych: Normal affect, patient feels ok   ASSESSMENT AND PLAN: .    Coronary artery disease Coronary artery disease with a history of PCI of the RCA system. Currently asymptomatic with no recent chest discomfort. Risk factors, including cholesterol and blood pressure, are well-controlled. No evidence of atrial fibrillation or flutter detected with implantable recorder. - Continue dual antiplatelet therapy with aspirin  and Plavix  long-term.  Systolic heart murmur Systolic heart murmur slightly louder than usual. Previous TEE suggested Lambell's excrescence. No evidence of atrial fibrillation or flutter detected with implantable recorder. - Order echocardiogram in the fall to reassess heart murmur.  Stroke Multiple strokes, with the most recent in 2023. No new strokes reported since the last visit. Risk factor reduction strategies are in place.  Hypertension Hypertension is well-controlled with current management.  Diabetes Diabetes management was not specifically discussed, but overall health indicators such as weight and cholesterol are being monitored.  Longitudinal care: The evaluation and management services provided today reflect the complexity inherent in caring for this patient, including the ongoing longitudinal relationship and management of  multiple chronic conditions and/or the need for care coordination. The visit required a comprehensive assessment and management plan tailored to the patient's unique needs Time was spent addressing not only the acute concerns but also the broader context of the patient's health, including preventive care, chronic disease management, and care coordination as appropriate.  Complex longitudinal is necessary for conditions including: aggressive multi-disciplinary coordination of care given frequent strokes   Gloriann Larger, MD FASE [facility] Cardiologist [facility]  [address] [place], [address] [place], [place] [address] [phone]  5:31 PM

## 2023-06-01 ENCOUNTER — Encounter: Payer: Self-pay | Admitting: Physical Therapy

## 2023-06-01 ENCOUNTER — Ambulatory Visit: Attending: Family Medicine | Admitting: Physical Therapy

## 2023-06-01 VITALS — BP 134/68 | HR 85

## 2023-06-01 DIAGNOSIS — R2689 Other abnormalities of gait and mobility: Secondary | ICD-10-CM | POA: Diagnosis present

## 2023-06-01 DIAGNOSIS — R262 Difficulty in walking, not elsewhere classified: Secondary | ICD-10-CM | POA: Insufficient documentation

## 2023-06-01 DIAGNOSIS — I639 Cerebral infarction, unspecified: Secondary | ICD-10-CM | POA: Diagnosis not present

## 2023-06-01 DIAGNOSIS — R2681 Unsteadiness on feet: Secondary | ICD-10-CM | POA: Insufficient documentation

## 2023-06-01 DIAGNOSIS — M545 Low back pain, unspecified: Secondary | ICD-10-CM | POA: Insufficient documentation

## 2023-06-01 DIAGNOSIS — M79605 Pain in left leg: Secondary | ICD-10-CM | POA: Diagnosis not present

## 2023-06-01 DIAGNOSIS — M6281 Muscle weakness (generalized): Secondary | ICD-10-CM | POA: Diagnosis present

## 2023-06-01 NOTE — Therapy (Signed)
 OUTPATIENT PHYSICAL THERAPY NEURO EVALUATION   Patient Name: Sarah [name] MRN: [id number] DOB:Oct 17, 1945, 78 y.o., female Today's Date: 06/01/2023   PCP: [place], [name], MD REFERRING PROVIDER: [place], [name], MD   END OF SESSION:  PT End of Session - 06/01/23 1447     Visit Number 1    Number of Visits 17    Date for PT Re-Evaluation 07/31/23    Authorization Type [employer]    PT Start Time 1444    PT Stop Time 1524    PT Time Calculation (min) 40 min    Equipment Utilized During Treatment Gait belt    Activity Tolerance Patient tolerated treatment well    Behavior During Therapy WFL for tasks assessed/performed             Past Medical History:  Diagnosis Date   Bilateral carpal tunnel syndrome 03/27/2019   Boils    Glaucoma    Heart murmur    HTN (hypertension)    Hx of adenomatous colonic polyps    Hyperlipidemia    Osteoporosis    Pneumonia    Stroke (HCC)    Type II or unspecified type diabetes mellitus without mention of complication, uncontrolled    Past Surgical History:  Procedure Laterality Date   ABDOMINAL HYSTERECTOMY     CARPAL TUNNEL RELEASE     right   COLONOSCOPY  12-10-10   per Dr. Arvie Latus, clear, repeat in 7 yrs    CORONARY STENT INTERVENTION N/A 03/11/2020   Procedure: CORONARY STENT INTERVENTION;  Surgeon: Lucendia Rusk, MD;  Location: [facility] INVASIVE CV LAB;  Service: Cardiovascular;  Laterality: N/A;   CORONARY ULTRASOUND/IVUS N/A 03/11/2020   Procedure: Intravascular Ultrasound/IVUS;  Surgeon: Lucendia Rusk, MD;  Location: [facility] INVASIVE CV LAB;  Service: Cardiovascular;  Laterality: N/A;   INCISION AND DRAINAGE PERIRECTAL ABSCESS N/A 09/26/2015   Procedure: IRRIGATION AND DEBRIDEMENT PERIRECTAL ABSCESS;  Surgeon: Derral Flick, MD;  Location: [facility] OR;  Service: General;  Laterality: N/A;   KNEE ARTHROSCOPY     right   LEFT HEART CATH AND CORONARY ANGIOGRAPHY N/A 03/11/2020   Procedure: LEFT HEART CATH AND CORONARY  ANGIOGRAPHY;  Surgeon: Lucendia Rusk, MD;  Location: [facility] INVASIVE CV LAB;  Service: Cardiovascular;  Laterality: N/A;   LOOP RECORDER INSERTION N/A 10/19/2016   Procedure: LOOP RECORDER INSERTION;  Surgeon: Jolly Needle, MD;  Location: [facility] INVASIVE CV LAB;  Service: Cardiovascular;  Laterality: N/A;   ORIF ANKLE FRACTURE Right 03/24/2012   Procedure: OPEN REDUCTION INTERNAL FIXATION (ORIF) ANKLE FRACTURE;  Surgeon: Timothy Ford, MD;  Location: [facility] OR;  Service: Orthopedics;  Laterality: Right;  Open Reduction Internal Fixation Right Bimalleolar ankle fracture   POLYPECTOMY     TEE WITHOUT CARDIOVERSION N/A 10/18/2016   Procedure: TRANSESOPHAGEAL ECHOCARDIOGRAM (TEE);  Surgeon: Elmyra Haggard, MD;  Location: [facility] ENDOSCOPY;  Service: Cardiovascular;  Laterality: N/A;   TEE WITHOUT CARDIOVERSION N/A 10/14/2022   Procedure: TRANSESOPHAGEAL ECHOCARDIOGRAM;  Surgeon: Lenise Quince, MD;  Location: [facility] INVASIVE CV LAB;  Service: Cardiovascular;  Laterality: N/A;   TONSILLECTOMY     Patient Active Problem List   Diagnosis Date Noted   B12 deficiency 04/12/2023   Acute ischemic stroke (HCC) 10/13/2022   Generalized weakness 10/12/2022   Stroke (cerebrum) (HCC) 10/12/2022   Stroke-like symptoms 10/11/2022   Urge urinary incontinence 07/02/2022   Seborrheic keratosis 07/02/2022   Pain of right lower extremity 12/11/2021   Nausea and vomiting in adult 12/11/2021  Elevated troponin    Change in mental status 10/24/2021   Long term current use of antithrombotics/antiplatelets 10/02/2021   Heart murmur 04/08/2021   Dizziness 04/08/2021   Long term (current) use of antithrombotics/antiplatelets 12/16/2020   Chronic constipation 12/16/2020   Colon cancer screening 12/16/2020   History of adenomatous polyp of colon 12/16/2020   Coronary artery disease of native artery of native heart with stable angina pectoris (HCC) 03/04/2020   Diabetes mellitus (HCC) 03/03/2020   Aortic atherosclerosis (HCC)  02/12/2020   Bilateral leg cramps 01/21/2020   Bilateral lower extremity edema 01/21/2020   Anxiety disorder 01/21/2020   Type 2 diabetes mellitus with hyperglycemia, with long-term current use of insulin  (HCC) 08/15/2019   Type 2 diabetes mellitus with diabetic polyneuropathy, with long-term current use of insulin  (HCC) 08/15/2019   Uncontrolled type 2 diabetes mellitus with hypoglycemia without coma (HCC) 08/15/2019   Hyperlipidemia LDL goal <70 08/15/2019   Bilateral carpal tunnel syndrome 03/27/2019   Osteoarthritis of both knees 11/27/2018   CKD (chronic kidney disease), stage II 11/27/2018   GERD (gastroesophageal reflux disease) 06/26/2018   Homonymous hemianopia, left 02/08/2018   Cerebrovascular accident (CVA) due to embolism of cerebral artery (HCC)    Diabetes mellitus with coincident hypertension (HCC)    Trigger finger, right index finger 10/17/2017   Low back pain 07/29/2017   Carotid artery stenosis 11/02/2016   Left arm weakness    Diastolic dysfunction    Hypertension with heart disease    Acute blood loss anemia    Cerebral infarction due to embolism of right middle cerebral artery (HCC)    Perirectal abscess 09/26/2015   Vitamin D  deficiency 07/23/2015   Osteopenia 02/16/2013   Ankle fracture 01/19/2013   Anemia 09/27/2011   Type II diabetes mellitus with nephropathy (HCC) 09/24/2011   Mixed hyperlipidemia 09/24/2011   Primary hypertension 09/24/2011    ONSET DATE: 05/18/2023  REFERRING DIAG: M79.605 (ICD-10-CM) - Pain of left anterior lower extremity M54.50 (ICD-10-CM) - Bilateral low back pain without sciatica, unspecified chronicity I63.9 (ICD-10-CM) - Cerebrovascular accident (CVA), unspecified mechanism (HCC)    THERAPY DIAG:  Muscle weakness (generalized)  Other abnormalities of gait and mobility  Unsteadiness on feet  Difficulty in walking, not elsewhere classified  Rationale for Evaluation and Treatment: Rehabilitation  SUBJECTIVE:                                                                                                                                                                                              SUBJECTIVE STATEMENT: Had home health therapy recently - ended about a week and a half ago. Was working  on walking, standing, balance, and going outside. Notes that she gets more tired now. Got started on gabapentin  due to L leg pain. Has difficulty with stairs, also has someone with her. Uses cane most of the time, otherwise will use her walker some in the house. When going outside will always have her walker, but pt brought in her cane today. No recent falls.   Pt accompanied by: self  PERTINENT HISTORY: PMH: CAD, HTN, hx of multiple CVAs, DM II, CKD II, anxiety, PAD, and GERD    history of  several small infarcts at left brain watershed areas and left cerebellum in 10/2022 concerning for cardioembolic source, and hx of punctate left cerebellar and left parietal lobe infarcts in 10/2021 likely incidental finding on MRI during encephalopathy work-up of unclear etiology, hx of right parieto-occipital infarcts in setting of progressive right MCA stenosis in 2020 likely secondary to large vessel disease source and right MCA stroke in 2018 s/p ILR without evidence of A-fib during 4 year monitoring duration   Per PCP: Patient complains of anterior left leg pain for over a week, anterior thigh to pretibial area, sometimes she feels like pain is radiating from her back down her entire her leg.  She says the pain is intermittent, and rates it as an 8/10. It occasionally wakes her up at night.   PAIN:  Are you having pain? No "reports stomach has been an issues for the past couple of days" pt reports that she has not been having an appetite and MD is aware   Vitals:   06/01/23 1502  BP: 134/68  Pulse: 85     PRECAUTIONS: Fall   FALLS: Has patient fallen in last 6 months? No  LIVING ENVIRONMENT: Lives with: lives with  mom (who is 53), has aide that comes in everyday and sister lives down the street and sister comes by everyday  Aide is there M-F, sister on weekends  Lives in: House/apartment Stairs: Yes: Internal: 10 steps; railings on both side and then single railing and External: from front door there are 2 steps, back door is the garage and another 2 steps; none Has following equipment at home: Single point cane, Walker - 2 wheeled, shower chair, and Grab bars Aide makes the meals, sister sets up weekly medications and does chores  PLOF: Independent with household mobility with device, Independent with community mobility with device, and Needs assistance with homemaking No driving   PATIENT GOALS: Wants to walk better and have better balance   OBJECTIVE:  Note: Objective measures were completed at Evaluation unless otherwise noted.  DIAGNOSTIC FINDINGS: MRI brain 10/12/22: IMPRESSION: 1. Small acute infarcts scattered in the left cerebral hemisphere and left cerebellum. 2. Extensive chronic small vessel disease and right cerebral cortex infarcts.  COGNITION: Overall cognitive status: Impaired   SENSATION: Light touch: Impaired , more difficult to detect in RLE   COORDINATION: Slightly dysmetric movements in BLE    POSTURE: rounded shoulders and forward head   LOWER EXTREMITY MMT:    MMT Right Eval Left Eval  Hip flexion 4 4  Hip extension    Hip abduction 4 4  Hip adduction 4 4  Hip internal rotation    Hip external rotation    Knee flexion 4 4  Knee extension 4+ 4+  Ankle dorsiflexion 4 4  Ankle plantarflexion    Ankle inversion    Ankle eversion    (Blank rows = not tested)  All tested in sitting  Decr DF ROM  in R ankle due to hx of surgery   BED MOBILITY:  Pt denies difficulty getting in and out of the bed at home   TRANSFERS: Sit to stand: SBA and CGA  Assistive device utilized: None     Stand to sit: SBA  Assistive device utilized: None      Pt needing BUE  support to come to stand and more reliant on arms, decr forward lean, one episode of CGA in standing for balance, decr eccentric control back to chair   GAIT: Findings: Gait Characteristics: step through pattern, decreased stance time- Right, decreased stance time- Left, decreased stride length, Right foot flat, Left foot flat, poor foot clearance- Right, and poor foot clearance- Left, Unsteadiness with short shuffled steps  Distance walked: Clinic distances ,  Assistive device utilized:Single point cane,  Level of assistance: CGA, and Comments: Lowered pt's cane as it was initially too high Pt intermittently hitting R foot to cane, needing CGA/min A for balance   FUNCTIONAL TESTS:  5 times sit to stand: 21.4 seconds with BUE support  Timed up and go (TUG): 31.5 seconds with SPC and CGA 10 meter walk test: 31.8 seconds = 1.03 ft/sec with SPC and CGA                                                                                                                                TREATMENT DATE: 06/01/23 Self-Care: Discussed fall risk based on gait and outcome measures and using a RW at all times instead of SPC to decr risk of falls and improve stability with gait    PATIENT EDUCATION: Education details: Clinical findings, POC  Person educated: Patient Education method: Explanation Education comprehension: verbalized understanding and needs further education  HOME EXERCISE PROGRAM: Will provide at future session   GOALS: Goals reviewed with patient? Yes  SHORT TERM GOALS: Target date: 06/29/2023  Pt will be independent with initial HEP for strength, gait, balance in order to build upon functional gains made in therapy. Baseline: Goal status: INITIAL  2.  Pt will improve 5x sit<>stand to less than or equal to 18 sec to demonstrate improved functional strength and transfer efficiency.   Baseline: 21.4 seconds with BUE support Goal status: INITIAL  3.  Pt will improve TUG time to 25  seconds or less with LRAD in order to demo decrease fall risk.  Baseline: 31.5 seconds with SPC and CGA Goal status: INITIAL  4.  Pt will improve gait speed with LRAD to at least 1.5 ft/sec in order to demo  decr fall risk  Baseline: 31.8 seconds = 1.03 ft/sec with SPC and CGA Goal status: INITIAL  5.  BERG to be assessed with STG/LTG written.  Baseline:  Goal status: INITIAL   LONG TERM GOALS: Target date: 07/27/2023  Pt will be independent with final HEP for strength, gait, balance in order to build upon functional gains made in therapy. Baseline:  Goal status: INITIAL  2.  BERG to be assessed with LTG written.  Baseline:  Goal status: INITIAL  3.  Pt will improve 5x sit<>stand to less than or equal to 15 sec to demonstrate improved functional strength and transfer efficiency.  Baseline: 21.4 seconds with BUE support Goal status: INITIAL  4.  Pt will improve gait speed with LRAD to at least 2.0 ft/sec in order to demo improved community mobility.  Baseline: 31.8 seconds = 1.03 ft/sec with SPC and CGA Goal status: INITIAL  5.  Pt will improve TUG time to 21 seconds or less with LRAD in order to demo decrease fall risk. Baseline: 31.5 seconds with SPC and CGA Goal status: INITIAL    ASSESSMENT:  CLINICAL IMPRESSION: Patient is a 78 year old female referred to Neuro OPPT for hx of multiple CVA.   Pt's PMH is significant for: CAD, HTN, hx of multiple CVAs, DM II, CKD II, anxiety, PAD, and GERD. The following deficits were present during the exam: impaired strength, impaired sensation, impaired coordination, decr balance, gait abnormalities, decr safety awareness, postural abnormalities. Based on TUG, 5x sit <> stand, and gait speed pt is an incr risk for falls. Pt ambulating in with SPC and needing CGA from therapist and a couple episodes of min A as pt bumping R foot into cane. Educated pt to use RW for mobility for safety and to decr fall risk. Pt would benefit from skilled  PT to address these impairments and functional limitations to maximize functional mobility independence and decr fall risk.    OBJECTIVE IMPAIRMENTS: Abnormal gait, decreased activity tolerance, decreased balance, decreased cognition, decreased coordination, decreased endurance, decreased knowledge of use of DME, decreased mobility, difficulty walking, decreased strength, decreased safety awareness, impaired sensation, and postural dysfunction.   ACTIVITY LIMITATIONS: carrying, lifting, bending, standing, squatting, stairs, transfers, and locomotion level  PARTICIPATION LIMITATIONS: meal prep, cleaning, medication management, driving, shopping, and community activity  PERSONAL FACTORS: Age, Behavior pattern, Past/current experiences, Time since onset of injury/illness/exacerbation, and 3+ comorbidities: CAD, HTN, hx of multiple CVAs, DM II, CKD II, anxiety, PAD, and GERD are also affecting patient's functional outcome.   REHAB POTENTIAL: Good  CLINICAL DECISION MAKING: Evolving/moderate complexity  EVALUATION COMPLEXITY: Moderate  PLAN:  PT FREQUENCY: 2x/week  PT DURATION: 8 weeks  PLANNED INTERVENTIONS: 97164- PT Re-evaluation, 97110-Therapeutic exercises, 97530- Therapeutic activity, 97112- Neuromuscular re-education, 97535- Self Care, 13086- Manual therapy, [phone]- Gait training, Patient/Family education, Balance training, Stair training, and DME instructions  PLAN FOR NEXT SESSION: perform BERG and write goal, initiate HEP for strength, balance. Work on Investment banker, corporate in clinic, stair training    Seabron Cypress, PT, DPT 06/01/2023, 3:26 PM

## 2023-06-07 ENCOUNTER — Ambulatory Visit (INDEPENDENT_AMBULATORY_CARE_PROVIDER_SITE_OTHER): Payer: Medicare Other

## 2023-06-07 DIAGNOSIS — I6340 Cerebral infarction due to embolism of unspecified cerebral artery: Secondary | ICD-10-CM

## 2023-06-07 LAB — CUP PACEART REMOTE DEVICE CHECK
Date Time Interrogation Session: 20250526233139
Implantable Pulse Generator Implant Date: 20240618

## 2023-06-08 ENCOUNTER — Ambulatory Visit: Payer: Self-pay

## 2023-06-08 NOTE — Telephone Encounter (Signed)
  Chief Complaint: dizzy, weak, loss of appetite Symptoms:  Frequency: x one week Pertinent Negatives: Patient denies SOB, chest pain Disposition: [] ED /[] Urgent Care (no appt availability in office) / [x] Appointment(In office/virtual)/ []  [place] [facility]/ [] Home Care/ [] Refused Recommended Disposition /[] [facility]/ []  Follow-up with PCP Additional Notes: /during triage call,  BP 115/60, P90. Patient states that over the past week, she has experienced constipation,  loss of appetite, dizziness on standing.  No s/s distress during phone call. Appointment scheduled by another agent for 5/29 Copied from CRM #[id number]. Topic: Clinical - Red Word Triage >> Jun 08, 2023 11:27 AM El Gravely T wrote: [facility] that prompted transfer to Nurse Triage: Fatigue, weakness, dizziness, loss of appetite, nauseated Reason for Disposition  [1] MILD dizziness (e.g., walking normally) AND [2] has NOT been evaluated by doctor (or NP/PA) for this  (Exception: Dizziness caused by heat exposure, sudden standing, or poor fluid intake.)  Answer Assessment - Initial Assessment Questions 1. DESCRIPTION: "Describe your dizziness."     If she moves or turns around too fast or when getting out of bed 2. LIGHTHEADED: "Do you feel lightheaded?" (e.g., somewhat faint, woozy, weak upon standing)     With activity, not at rest 3. VERTIGO: "Do you feel like either you or the room is spinning or tilting?" (i.e. vertigo)     no 4. SEVERITY: "How bad is it?"  "Do you feel like you are going to faint?" "Can you stand and walk?"   - MILD: Feels slightly dizzy, but walking normally.   - MODERATE: Feels unsteady when walking, but not falling; interferes with normal activities (e.g., school, work).   - SEVERE: Unable to walk without falling, or requires assistance to walk without falling; feels like passing out now.      mild 5. ONSET:  "When did the dizziness begin?"     One week 6. AGGRAVATING FACTORS: "Does anything  make it worse?" (e.g., standing, change in head position)     Change in position, moving too fast 7. HEART RATE: "Can you tell me your heart rate?" "How many beats in 15 seconds?"  (Note: not all patients can do this)       unsure 8. CAUSE: "What do you think is causing the dizziness?"     Not sure, loss of appetite, not eating well 9. RECURRENT SYMPTOM: "Have you had dizziness before?" If Yes, ask: "When was the last time?" "What happened that time?"     no 10. OTHER SYMPTOMS: "Do you have any other symptoms?" (e.g., fever, chest pain, vomiting, diarrhea, bleeding)       constipation  Protocols used: Dizziness - Lightheadedness-A-AH

## 2023-06-09 ENCOUNTER — Ambulatory Visit: Admitting: Physical Therapy

## 2023-06-10 ENCOUNTER — Encounter: Payer: Self-pay | Admitting: Family Medicine

## 2023-06-10 ENCOUNTER — Ambulatory Visit

## 2023-06-10 ENCOUNTER — Ambulatory Visit: Admitting: Family Medicine

## 2023-06-10 ENCOUNTER — Ambulatory Visit: Payer: Self-pay | Admitting: Family Medicine

## 2023-06-10 VITALS — BP 124/72 | HR 94 | Temp 98.4°F | Resp 16 | Ht 60.0 in | Wt 97.2 lb

## 2023-06-10 DIAGNOSIS — K5900 Constipation, unspecified: Secondary | ICD-10-CM | POA: Diagnosis not present

## 2023-06-10 DIAGNOSIS — R634 Abnormal weight loss: Secondary | ICD-10-CM

## 2023-06-10 DIAGNOSIS — R5383 Other fatigue: Secondary | ICD-10-CM | POA: Diagnosis not present

## 2023-06-10 DIAGNOSIS — E876 Hypokalemia: Secondary | ICD-10-CM

## 2023-06-10 DIAGNOSIS — R112 Nausea with vomiting, unspecified: Secondary | ICD-10-CM | POA: Diagnosis not present

## 2023-06-10 DIAGNOSIS — R1033 Periumbilical pain: Secondary | ICD-10-CM

## 2023-06-10 LAB — BASIC METABOLIC PANEL WITH GFR
BUN: 24 mg/dL — ABNORMAL HIGH (ref 6–23)
CO2: 27 meq/L (ref 19–32)
Calcium: 10.1 mg/dL (ref 8.4–10.5)
Chloride: 102 meq/L (ref 96–112)
Creatinine, Ser: 1.21 mg/dL — ABNORMAL HIGH (ref 0.40–1.20)
GFR: 43.73 mL/min — ABNORMAL LOW (ref >60.00)
Glucose, Bld: 153 mg/dL — ABNORMAL HIGH (ref 70–99)
Potassium: 3.3 meq/L — ABNORMAL LOW (ref 3.5–5.1)
Sodium: 142 meq/L (ref 135–145)

## 2023-06-10 LAB — CBC
HCT: 37.4 % (ref 36.0–46.0)
Hemoglobin: 12.4 g/dL (ref 12.0–15.0)
MCHC: 33.2 g/dL (ref 30.0–36.0)
MCV: 92.2 fl (ref 78.0–100.0)
Platelets: 325 10*3/uL (ref 150.0–400.0)
RBC: 4.06 Mil/uL (ref 3.87–5.11)
RDW: 13.5 % (ref 11.5–15.5)
WBC: 5.2 10*3/uL (ref 4.0–10.5)

## 2023-06-10 LAB — C-REACTIVE PROTEIN: CRP: <1 mg/dL (ref 0.5–20.0)

## 2023-06-10 MED ORDER — POTASSIUM CHLORIDE CRYS ER 20 MEQ PO TBCR: 20.0000 meq | EXTENDED_RELEASE_TABLET | Freq: Every day | ORAL | 0 refills | Status: DC

## 2023-06-10 MED ORDER — ONDANSETRON HCL 4 MG PO TABS: 4.0000 mg | ORAL_TABLET | Freq: Two times a day (BID) | ORAL | 1 refills | Status: AC | PRN

## 2023-06-10 NOTE — Progress Notes (Signed)
 . Chief Complaint  Patient presents with   Medical Management of Chronic Issues    Pt reports the pain eased out once stop the medication- gabapentin . Loss of appetite. Feeling tired. Pt reports concerns on her losing weight. Pt added she will see her endocrinology next week.    Anorexia   Fatigue    HPI: Sarah [name] is a 78 y.o. female with a PMHx significant for CAD, HTN, CVA, DM II, CKD II, anxiety, PAD, and GERD, who is here today  with some concerns.  Last seen on 05/13/2023, when she was c/o anterior LLE pain, thought to be radicular. Gabapentin  recommended, discontinued because caused drowsiness. States that leg pain has resolved.  Fatigue:  Patient complains of fatigue for about a month.  Endorses some associated SOB and palpitations with exertion, no associated CP.   She says she is sleeping 6-7 hours per night.  Feels rested when she wakes up. She does not nap throughout the day.   Currently on Sertraline  50 mg daily for anxiety, dose was decreased last visit.  Reports BS's improvement,  She has an appointment with endocrinology next week.  Her blood sugars have been in the 170s.   Hypertension: Currently on amlodipine  5 mg daily and losartan  50 mg daily.  She has been checking her BP and says her systolic readings have been in the 130s.  CKD II, reports blood work 05/21/23 but not aware of results. Next appt with nephrologist 07/2023.  She reports gradual worsening vision, but has been unable to get an appointment with an eye doctor until 12/2023.   Lab Results  Component Value Date   CREATININE 1.09 (H) 03/17/2023   BUN 20 03/17/2023   NA 141 03/17/2023   K 4.6 03/17/2023   CL 111 (H) 03/17/2023   CO2 19 (L) 03/17/2023   She saw her cardiologist last week. States that it was found that her heart murmur was louder, so echo has been arranged.   Nausea/vomiting/constipation:  Patient also complains she gets lightheaded and nauseous in the morning  sometimes, and has been vomiting for the last 2-3 days. Clear fluids, usually after eating and going to bed right after, not sure about heart burn.  She has not had a bowel movement for four days.  + Passing gas. Taking bisacodyl , took miralax  before but is not daily Also has had some abdominal pain with palpation for about 2 weeks. The pain improves with bowel movements.   Notes she has started taking Trulicity  since 03/2023.  Pertinent negatives include cough, wheezing, melena,hematemesis, urinary problems, or known sick contacts.   She takes Zofran  as needed for nausea.  Abdominal CT from 11/19/2021 due to nausea,vomiting,and decreased appetite.  1.) No acute abnormality in the abdomen/pelvis.  2.) Moderate to large volume of stool throughout the colon, can be seen with constipation. Colonic diverticulosis without diverticulitis.   She mentions she has lost some weight recently, and this was noted by her cardiologist at her last visit.  She has had some decreased appetite lately.   Also mentions that recently she saw a dermatologist.   Review of Systems  Constitutional:  Negative for chills and fever.  HENT:  Negative for sore throat and trouble swallowing.   Cardiovascular:  Negative for leg swelling.  Endocrine: Negative for cold intolerance and heat intolerance.  Genitourinary:  Negative for decreased urine volume, dysuria and hematuria.  Musculoskeletal:  Positive for arthralgias and gait problem.  Skin:  Negative for rash.  Neurological:  Negative for syncope and facial asymmetry.  Psychiatric/Behavioral:  Negative for confusion and hallucinations.   See other pertinent positives and negatives in HPI.  Current Outpatient Medications on File Prior to Visit  Medication Sig Dispense Refill   amLODipine  (NORVASC ) 5 MG tablet Take 1 tablet (5 mg total) by mouth daily. 90 tablet 3   aspirin  EC 81 MG tablet Take 1 tablet (81 mg total) by mouth daily. Swallow whole. 30 tablet 12    cholecalciferol (VITAMIN D3) 25 MCG (1000 UNIT) tablet Take 1,000 Units by mouth daily.     clopidogrel  (PLAVIX ) 75 MG tablet Take 1 tablet (75 mg total) by mouth daily. Start AFTER completing the brilinta  prescription 90 tablet 1   Continuous Glucose Sensor (DEXCOM G7 SENSOR) MISC USE AS DIRECTED 9 each 3   dapagliflozin  propanediol (FARXIGA ) 10 MG TABS tablet Take 1 tablet (10 mg total) by mouth daily. 90 tablet 3   Dulaglutide  (TRULICITY ) 1.5 MG/0.5ML SOAJ Inject 1.5 mg into the skin once a week. 6 mL 3   ezetimibe  (ZETIA ) 10 MG tablet Take 1 tablet (10 mg total) by mouth daily. 90 tablet 3   furosemide  (LASIX ) 20 MG tablet Take 1 tablet by mouth daily (only as needed) 90 tablet 3   insulin  glargine, 1 Unit Dial , (TOUJEO  SOLOSTAR) 300 UNIT/ML Solostar Pen Inject 14 Units into the skin daily in the afternoon. 15 mL 3   insulin  lispro (HUMALOG  KWIKPEN) 100 UNIT/ML KwikPen Max daily 30 units (Patient taking differently: Inject 6 Units into the skin 3 (three) times daily. Max daily 30 units) 30 mL 3   latanoprost  (XALATAN ) 0.005 % ophthalmic solution Place 1 drop into both eyes at bedtime.     losartan  (COZAAR ) 50 MG tablet TAKE 1 TABLET BY MOUTH DAILY 90 tablet 3   Magnesium 250 MG TABS Take 250 mg by mouth daily.     nitroGLYCERIN  (NITROSTAT ) 0.4 MG SL tablet Place 1 tablet (0.4 mg total) under the tongue every 5 (five) minutes as needed for chest pain. one tab every 5 minutes up to 3 tablets total over 15 minutes. 25 tablet 3   rosuvastatin  (CRESTOR ) 20 MG tablet TAKE 1 TABLET BY MOUTH DAILY 90 tablet 3   sertraline  (ZOLOFT ) 50 MG tablet TAKE 1 TABLET BY MOUTH DAILY 90 tablet 3   topiramate  (TOPAMAX ) 50 MG tablet Take 1 tablet (50 mg total) by mouth at bedtime. 30 tablet 5   gabapentin  (NEURONTIN ) 100 MG capsule Take 2 capsules (200 mg total) by mouth at bedtime. (Patient not taking: Reported on 06/10/2023) 60 capsule 1   No current facility-administered medications on file prior to visit.    Past Medical History:  Diagnosis Date   Bilateral carpal tunnel syndrome 03/27/2019   Boils    Glaucoma    Heart murmur    HTN (hypertension)    Hx of adenomatous colonic polyps    Hyperlipidemia    Osteoporosis    Pneumonia    Stroke (HCC)    Type II or unspecified type diabetes mellitus without mention of complication, uncontrolled    Allergies  Allergen Reactions   Penicillins Other (See Comments)    Does not know reaction    Social History   Socioeconomic History   Marital status: Single    Spouse name: Not on file   Number of children: Not on file   Years of education: Not on file   Highest education level: Associate degree: occupational, Scientist, product/process development, or vocational program  Occupational History  Not on file  Tobacco Use   Smoking status: Former    Current packs/day: 0.00    Types: Cigarettes    Quit date: 10/15/2016    Years since quitting: 6.6   Smokeless tobacco: Never   Tobacco comments:    smokes occ.   Vaping Use   Vaping status: Never Used  Substance and Sexual Activity   Alcohol  use: Not Currently    Comment: occ   Drug use: No   Sexual activity: Not on file  Other Topics Concern   Not on file  Social History Narrative   Caffiene coffee 1 cup in am.   Retired from [employer].     Social Drivers of Corporate investment banker Strain: Low Risk  (05/16/2023)   Overall Financial Resource Strain (CARDIA)    Difficulty of Paying Living Expenses: Not hard at all  Food Insecurity: No Food Insecurity (05/16/2023)   Hunger Vital Sign    Worried About Running Out of Food in the Last Year: Never true    Ran Out of Food in the Last Year: Never true  Transportation Needs: No Transportation Needs (05/16/2023)   PRAPARE - Administrator, Civil Service (Medical): No    Lack of Transportation (Non-Medical): No  Physical Activity: Insufficiently Active (05/16/2023)   Exercise Vital Sign    Days of Exercise per Week: 2 days    Minutes of  Exercise per Session: 60 min  Stress: No Stress Concern Present (05/16/2023)   [employer] of Occupational Health - Occupational Stress Questionnaire    Feeling of Stress : Not at all  Recent Concern: Stress - Stress Concern Present (05/09/2023)   [employer] of Occupational Health - Occupational Stress Questionnaire    Feeling of Stress : To some extent  Social Connections: Socially Isolated (05/16/2023)   Social Connection and Isolation Panel [NHANES]    Frequency of Communication with Friends and Family: More than three times a week    Frequency of Social Gatherings with Friends and Family: More than three times a week    Attends Religious Services: Never    Database administrator or Organizations: No    Attends Banker Meetings: Never    Marital Status: Widowed   Today's Vitals   06/10/23 1050  BP: 124/72  Pulse: 94  Resp: 16  Temp: 98.4 F (36.9 C)  SpO2: 96%  Weight: 97 lb 3.2 oz (44.1 kg)  Height: 5' (1.524 m)   Wt Readings from Last 3 Encounters:  06/10/23 97 lb 3.2 oz (44.1 kg)  05/30/23 98 lb 6.4 oz (44.6 kg)  05/16/23 103 lb (46.7 kg)   Body mass index is 18.98 kg/m.  Physical Exam Vitals and nursing note reviewed.  Constitutional:      General: She is not in acute distress.    Appearance: She is well-developed.  HENT:     Head: Normocephalic and atraumatic.     Mouth/Throat:     Mouth: Mucous membranes are dry.     Pharynx: Oropharynx is clear. Uvula midline.  Eyes:     Conjunctiva/sclera: Conjunctivae normal.  Cardiovascular:     Rate and Rhythm: Normal rate and regular rhythm.     Heart sounds: Murmur (soft SEM RUSB) heard.     Comments: Pulses present Pulmonary:     Effort: Pulmonary effort is normal. No respiratory distress.     Breath sounds: Normal breath sounds.  Abdominal:     General: There is no  abdominal bruit.     Palpations: Abdomen is soft. There is no mass.     Tenderness: There is abdominal tenderness in the  periumbilical area. There is no guarding or rebound.     Comments: Mild,tenderness with palpation and firm defined area in lower abdomen, right under umbilicus.  Musculoskeletal:     Right lower leg: No edema.     Left lower leg: No edema.  Lymphadenopathy:     Cervical: No cervical adenopathy.  Skin:    General: Skin is warm.     Findings: No erythema or rash.  Neurological:     General: No focal deficit present.     Mental Status: She is alert and oriented to person, place, and time.     Comments: Unstable gait assisted with a walker.  Psychiatric:        Mood and Affect: Mood and affect normal.   ASSESSMENT AND PLAN:  Ms. Skoczylas was seen today for chronic disease management.   Orders Placed This Encounter  Procedures   DG Abd 2 Views   CBC   Basic metabolic panel with GFR   C-reactive protein   Lab Results  Component Value Date   WBC 5.2 06/10/2023   HGB 12.4 06/10/2023   HCT 37.4 06/10/2023   MCV 92.2 06/10/2023   PLT 325.0 06/10/2023   Lab Results  Component Value Date   NA 142 06/10/2023   CL 102 06/10/2023   K 3.3 (L) 06/10/2023   CO2 27 06/10/2023   BUN 24 (H) 06/10/2023   CREATININE 1.21 (H) 06/10/2023   GFR 43.73 (L) 06/10/2023   CALCIUM  10.1 06/10/2023   PHOS 3.5 10/14/2022   ALBUMIN 3.3 (L) 03/17/2023   GLUCOSE 153 (H) 06/10/2023   Lab Results  Component Value Date   CRP <1.0 06/10/2023   Constipation, unspecified constipation type Recommend taking Miralax  and Bisacodyl  daily at bedtime. Increase water intake and continue adequate fiber intake. Last colonoscopy in 02/2021.  -     DG Abd 2 Views; Future  Nausea and vomiting in adult These symptoms seem to be chronic. Possible causes discussed, including medications,dyspepsia ,and gastroparesis. Recommend GERD like precautions, small meals and avoid going to bed right after eating. Continue Zofran  daily as needed. Clearly instructed about warning signs.  -     CBC; Future -     Basic  metabolic panel with GFR; Future -     C-reactive protein; Future  Fatigue, unspecified type A few of her chronic comorbilities can certainly aggravate this problem. ? Deconditioning. Planning on starting outpt PT. Monitor for new symptoms. Further recommendations according to lab results.  -     CBC; Future -     Basic metabolic panel with GFR; Future -     C-reactive protein; Future  Weight loss, non-intentional Has lost about 6 Lb since her last visit , 05/13/23. Reports decreased appetite. Recommend increasing amount of protein intake, small bites through the day, q 1-3 hours. We discussed pharmacologic options like Mirtazapine to help with appetite, we reviewed side effects. We decided to hols on adding medications at this time.  Periumbilical abdominal pain I think it is related to constipation, other possible casues discussed. Noted with palpation with infraumbilical firm area, which could be stool. No signs of acute abdomen and pain is just with palpation. Abdominal X ray ordered today. She had abdominal CT in 11/2021, which showed moderate to large amount of stool and colonic diverticulosis. She was clearly instructed about warning  signs.  Hypokalemia  Mild. Sent KLOR 20 meq to take once daily x 5 d. Will re-check next visit.  I spent a total of 47 minutes in both face to face and non face to face activities for this visit on the date of this encounter. During this time history was obtained and documented, examination was performed, prior labs/imaging reviewed, and assessment/plan discussed.  Return in about 4 weeks (around 07/08/2023).  I, Fritz Jewel Wierda, acting as a scribe for Lajoyce Tamura [place], MD., have documented all relevant documentation on the behalf of Sarah Downum [place], MD, as directed by  Sarah Eriksen [place], MD while in the presence of Sarah Chick [place], MD.   I, Maelle Sheaffer [place], MD, have reviewed all documentation for this visit. The documentation on 06/10/23 for the exam, diagnosis,  procedures, and orders are all accurate and complete.  Nguyen Butler G. [place], MD  [facility]. [place] office.

## 2023-06-10 NOTE — Patient Instructions (Addendum)
 A few things to remember from today's visit:  Pain of left anterior lower extremity  Constipation, unspecified constipation type - Plan: DG Abd 2 Views  Nausea and vomiting in adult - Plan: CBC, Basic metabolic panel with GFR, C-reactive protein  Fatigue, unspecified type - Plan: CBC, Basic metabolic panel with GFR, C-reactive protein  Small bites of protein through the day, do not go to bed after eating, wait 3-4 hours. Monitor for new symptoms. Bisacodyl  and Miralax  daily at bedtime. Continue Zofran  daily as needed.  If you need refills for medications you take chronically, please call your pharmacy. Do not use My Chart to request refills or for acute issues that need immediate attention. If you send a my chart message, it may take a few days to be addressed, specially if I am not in the office.  Please be sure medication list is accurate. If a new problem present, please set up appointment sooner than planned today.

## 2023-06-13 ENCOUNTER — Encounter: Payer: Self-pay | Admitting: Physical Therapy

## 2023-06-13 ENCOUNTER — Ambulatory Visit: Attending: Family Medicine | Admitting: Physical Therapy

## 2023-06-13 VITALS — BP 123/60 | HR 83

## 2023-06-13 DIAGNOSIS — R278 Other lack of coordination: Secondary | ICD-10-CM | POA: Insufficient documentation

## 2023-06-13 DIAGNOSIS — R2689 Other abnormalities of gait and mobility: Secondary | ICD-10-CM | POA: Insufficient documentation

## 2023-06-13 DIAGNOSIS — R2681 Unsteadiness on feet: Secondary | ICD-10-CM | POA: Diagnosis present

## 2023-06-13 DIAGNOSIS — R262 Difficulty in walking, not elsewhere classified: Secondary | ICD-10-CM | POA: Insufficient documentation

## 2023-06-13 DIAGNOSIS — R293 Abnormal posture: Secondary | ICD-10-CM | POA: Insufficient documentation

## 2023-06-13 DIAGNOSIS — M6281 Muscle weakness (generalized): Secondary | ICD-10-CM | POA: Insufficient documentation

## 2023-06-13 NOTE — Therapy (Signed)
 OUTPATIENT PHYSICAL THERAPY NEURO TREATMENT   Patient Name: Sarah [name] MRN: [id number] DOB:09/30/1945, 78 y.o., female Today's Date: 06/13/2023   PCP: [place], [name], MD REFERRING PROVIDER: [place], [name], MD   END OF SESSION:  PT End of Session - 06/13/23 1332     Visit Number 2    Number of Visits 17    Date for PT Re-Evaluation 07/31/23    Authorization Type [employer] Medicare    PT Start Time 1320    PT Stop Time 1401    PT Time Calculation (min) 41 min    Equipment Utilized During Treatment Gait belt    Activity Tolerance Patient tolerated treatment well;Other (comment)   elevated blood glucose   Behavior During Therapy [employer] for tasks assessed/performed             Past Medical History:  Diagnosis Date   Bilateral carpal tunnel syndrome 03/27/2019   Boils    Glaucoma    Heart murmur    HTN (hypertension)    Hx of adenomatous colonic polyps    Hyperlipidemia    Osteoporosis    Pneumonia    Stroke (HCC)    Type II or unspecified type diabetes mellitus without mention of complication, uncontrolled    Past Surgical History:  Procedure Laterality Date   ABDOMINAL HYSTERECTOMY     CARPAL TUNNEL RELEASE     right   COLONOSCOPY  12-10-10   per Dr. Arvie Latus, clear, repeat in 7 yrs    CORONARY STENT INTERVENTION N/A 03/11/2020   Procedure: CORONARY STENT INTERVENTION;  Surgeon: Lucendia Rusk, MD;  Location: [facility] INVASIVE CV LAB;  Service: Cardiovascular;  Laterality: N/A;   CORONARY ULTRASOUND/IVUS N/A 03/11/2020   Procedure: Intravascular Ultrasound/IVUS;  Surgeon: Lucendia Rusk, MD;  Location: [facility] INVASIVE CV LAB;  Service: Cardiovascular;  Laterality: N/A;   INCISION AND DRAINAGE PERIRECTAL ABSCESS N/A 09/26/2015   Procedure: IRRIGATION AND DEBRIDEMENT PERIRECTAL ABSCESS;  Surgeon: Derral Flick, MD;  Location: [facility] OR;  Service: General;  Laterality: N/A;   KNEE ARTHROSCOPY     right   LEFT HEART CATH AND CORONARY ANGIOGRAPHY N/A 03/11/2020    Procedure: LEFT HEART CATH AND CORONARY ANGIOGRAPHY;  Surgeon: Lucendia Rusk, MD;  Location: [facility] INVASIVE CV LAB;  Service: Cardiovascular;  Laterality: N/A;   LOOP RECORDER INSERTION N/A 10/19/2016   Procedure: LOOP RECORDER INSERTION;  Surgeon: Jolly Needle, MD;  Location: [facility] INVASIVE CV LAB;  Service: Cardiovascular;  Laterality: N/A;   ORIF ANKLE FRACTURE Right 03/24/2012   Procedure: OPEN REDUCTION INTERNAL FIXATION (ORIF) ANKLE FRACTURE;  Surgeon: Timothy Ford, MD;  Location: [facility] OR;  Service: Orthopedics;  Laterality: Right;  Open Reduction Internal Fixation Right Bimalleolar ankle fracture   POLYPECTOMY     TEE WITHOUT CARDIOVERSION N/A 10/18/2016   Procedure: TRANSESOPHAGEAL ECHOCARDIOGRAM (TEE);  Surgeon: Elmyra Haggard, MD;  Location: [facility] ENDOSCOPY;  Service: Cardiovascular;  Laterality: N/A;   TEE WITHOUT CARDIOVERSION N/A 10/14/2022   Procedure: TRANSESOPHAGEAL ECHOCARDIOGRAM;  Surgeon: Lenise Quince, MD;  Location: [facility] INVASIVE CV LAB;  Service: Cardiovascular;  Laterality: N/A;   TONSILLECTOMY     Patient Active Problem List   Diagnosis Date Noted   B12 deficiency 04/12/2023   Acute ischemic stroke (HCC) 10/13/2022   Generalized weakness 10/12/2022   Stroke (cerebrum) (HCC) 10/12/2022   Stroke-like symptoms 10/11/2022   Urge urinary incontinence 07/02/2022   Seborrheic keratosis 07/02/2022   Pain of right lower extremity 12/11/2021   Nausea and vomiting  in adult 12/11/2021   Elevated troponin    Change in mental status 10/24/2021   Long term current use of antithrombotics/antiplatelets 10/02/2021   Heart murmur 04/08/2021   Dizziness 04/08/2021   Long term (current) use of antithrombotics/antiplatelets 12/16/2020   Chronic constipation 12/16/2020   Colon cancer screening 12/16/2020   History of adenomatous polyp of colon 12/16/2020   Coronary artery disease of native artery of native heart with stable angina pectoris (HCC) 03/04/2020   Diabetes mellitus (HCC)  03/03/2020   Aortic atherosclerosis (HCC) 02/12/2020   Bilateral leg cramps 01/21/2020   Bilateral lower extremity edema 01/21/2020   Anxiety disorder 01/21/2020   Type 2 diabetes mellitus with hyperglycemia, with long-term current use of insulin  (HCC) 08/15/2019   Type 2 diabetes mellitus with diabetic polyneuropathy, with long-term current use of insulin  (HCC) 08/15/2019   Uncontrolled type 2 diabetes mellitus with hypoglycemia without coma (HCC) 08/15/2019   Hyperlipidemia LDL goal <70 08/15/2019   Bilateral carpal tunnel syndrome 03/27/2019   Osteoarthritis of both knees 11/27/2018   CKD (chronic kidney disease), stage II 11/27/2018   GERD (gastroesophageal reflux disease) 06/26/2018   Homonymous hemianopia, left 02/08/2018   Cerebrovascular accident (CVA) due to embolism of cerebral artery (HCC)    Diabetes mellitus with coincident hypertension (HCC)    Trigger finger, right index finger 10/17/2017   Low back pain 07/29/2017   Carotid artery stenosis 11/02/2016   Left arm weakness    Diastolic dysfunction    Hypertension with heart disease    Acute blood loss anemia    Cerebral infarction due to embolism of right middle cerebral artery (HCC)    Perirectal abscess 09/26/2015   Vitamin D  deficiency 07/23/2015   Osteopenia 02/16/2013   Ankle fracture 01/19/2013   Anemia 09/27/2011   Type II diabetes mellitus with nephropathy (HCC) 09/24/2011   Mixed hyperlipidemia 09/24/2011   Primary hypertension 09/24/2011    ONSET DATE: 05/18/2023  REFERRING DIAG: M79.605 (ICD-10-CM) - Pain of left anterior lower extremity M54.50 (ICD-10-CM) - Bilateral low back pain without sciatica, unspecified chronicity I63.9 (ICD-10-CM) - Cerebrovascular accident (CVA), unspecified mechanism (HCC)    THERAPY DIAG:  Muscle weakness (generalized)  Other abnormalities of gait and mobility  Unsteadiness on feet  Difficulty in walking, not elsewhere classified  Rationale for Evaluation and  Treatment: Rehabilitation  SUBJECTIVE:                                                                                                                                                                                             SUBJECTIVE STATEMENT: She presents alone with her rollator.  She has an intense  headache today, but has not taken her medications today.  She reports they stopped the gabapentin  and started her on a potassium tablet that she has not picked up yet.  She states "I am getting over something still" and requests a mask.  No falls or acute changes.  Pt accompanied by: self  PERTINENT HISTORY: PMH: CAD, HTN, hx of multiple CVAs, DM II, CKD II, anxiety, PAD, and GERD    history of  several small infarcts at left brain watershed areas and left cerebellum in 10/2022 concerning for cardioembolic source, and hx of punctate left cerebellar and left parietal lobe infarcts in 10/2021 likely incidental finding on MRI during encephalopathy work-up of unclear etiology, hx of right parieto-occipital infarcts in setting of progressive right MCA stenosis in 2020 likely secondary to large vessel disease source and right MCA stroke in 2018 s/p ILR without evidence of A-fib during 4 year monitoring duration   Per PCP: Patient complains of anterior left leg pain for over a week, anterior thigh to pretibial area, sometimes she feels like pain is radiating from her back down her entire her leg.  She says the pain is intermittent, and rates it as an 8/10. It occasionally wakes her up at night.   PAIN:  Are you having pain? Yes: NPRS scale: 6 Pain location: around the eyes Pain description: headache Aggravating factors: light Relieving factors: unsure - she has an eye doctor appt in December   Vitals:   06/13/23 1329  BP: 123/60  Pulse: 83     PRECAUTIONS: Fall   FALLS: Has patient fallen in last 6 months? No  LIVING ENVIRONMENT: Lives with: lives with mom (who is 7), has aide that  comes in everyday and sister lives down the street and sister comes by everyday  Aide is there M-F, sister on weekends  Lives in: House/apartment Stairs: Yes: Internal: 10 steps; railings on both side and then single railing and External: from front door there are 2 steps, back door is the garage and another 2 steps; none Has following equipment at home: Single point cane, Walker - 2 wheeled, shower chair, and Grab bars Aide makes the meals, sister sets up weekly medications and does chores  PLOF: Independent with household mobility with device, Independent with community mobility with device, and Needs assistance with homemaking No driving   PATIENT GOALS: Wants to walk better and have better balance   OBJECTIVE:  Note: Objective measures were completed at Evaluation unless otherwise noted.  DIAGNOSTIC FINDINGS: MRI brain 10/12/22: IMPRESSION: 1. Small acute infarcts scattered in the left cerebral hemisphere and left cerebellum. 2. Extensive chronic small vessel disease and right cerebral cortex infarcts.  COGNITION: Overall cognitive status: Impaired   SENSATION: Light touch: Impaired , more difficult to detect in RLE   COORDINATION: Slightly dysmetric movements in BLE    POSTURE: rounded shoulders and forward head   LOWER EXTREMITY MMT:    MMT Right Eval Left Eval  Hip flexion 4 4  Hip extension    Hip abduction 4 4  Hip adduction 4 4  Hip internal rotation    Hip external rotation    Knee flexion 4 4  Knee extension 4+ 4+  Ankle dorsiflexion 4 4  Ankle plantarflexion    Ankle inversion    Ankle eversion    (Blank rows = not tested)  All tested in sitting  Decr DF ROM in R ankle due to hx of surgery   BED MOBILITY:  Pt denies difficulty getting  in and out of the bed at home   TRANSFERS: Sit to stand: SBA and CGA  Assistive device utilized: None     Stand to sit: SBA  Assistive device utilized: None      Pt needing BUE support to come to stand and  more reliant on arms, decr forward lean, one episode of CGA in standing for balance, decr eccentric control back to chair   GAIT: Findings: Gait Characteristics: step through pattern, decreased stance time- Right, decreased stance time- Left, decreased stride length, Right foot flat, Left foot flat, poor foot clearance- Right, and poor foot clearance- Left, Unsteadiness with short shuffled steps  Distance walked: Clinic distances ,  Assistive device utilized:Single point cane,  Level of assistance: CGA, and Comments: Lowered pt's cane as it was initially too high Pt intermittently hitting R foot to cane, needing CGA/min A for balance   FUNCTIONAL TESTS:  5 times sit to stand: 21.4 seconds with BUE support  Timed up and go (TUG): 31.5 seconds with SPC and CGA 10 meter walk test: 31.8 seconds = 1.03 ft/sec with SPC and CGA                                                                                                                                TREATMENT DATE: 06/13/23 Vitals on LUE prior to session: Vitals:   06/13/23 1329  BP: 123/60  Pulse: 83   BERG:  [facility] PT Assessment - 06/13/23 1337       Standardized Balance Assessment   Standardized Balance Assessment Berg Balance Test      Berg Balance Test   Sit to Stand Able to stand  independently using hands    Standing Unsupported Able to stand 2 minutes with supervision    Sitting with Back Unsupported but Feet Supported on Floor or Stool Able to sit safely and securely 2 minutes    Stand to Sit Controls descent by using hands    Transfers Able to transfer with verbal cueing and /or supervision    Standing Unsupported with Eyes Closed Able to stand 10 seconds with supervision    Standing Unsupported with Feet Together Able to place feet together independently and stand for 1 minute with supervision    From Standing, Reach Forward with Outstretched Arm Can reach forward >12 cm safely (5")    From Standing Position, Pick up Object  from Floor Able to pick up shoe, needs supervision    From Standing Position, Turn to Look Behind Over each Shoulder Looks behind from both sides and weight shifts well    Turn 360 Degrees Needs close supervision or verbal cueing    Standing Unsupported, Alternately Place Feet on Step/Stool Able to complete >2 steps/needs minimal assist    Standing Unsupported, One Foot in Front Able to plae foot ahead of the other independently and hold 30 seconds    Standing on One Leg Tries to lift leg/unable to  hold 3 seconds but remains standing independently    Total Score 37    Berg comment: 37/56 = significant fall risk; full-time walker use            BP re-assessed due to worsened headache (7-8/10):  116/47, HR 82 bpm Pt alarm goes off for Dexcom (sensor on RUE), had pt check and blood glucose 379, provided water and quiet monitored rest and rechecked after 4 minutes:  381  PATIENT EDUCATION: Education details: BERG interpretation.  Glucose limits for PT and checking before sessions so she can utilize insulin /medications or eat as appropriate. Person educated: Patient Education method: Explanation Education comprehension: verbalized understanding and needs further education  HOME EXERCISE PROGRAM: Will provide at future session   GOALS: Goals reviewed with patient? Yes  SHORT TERM GOALS: Target date: 06/29/2023  Pt will be independent with initial HEP for strength, gait, balance in order to build upon functional gains made in therapy. Baseline: Goal status: INITIAL  2.  Pt will improve 5x sit<>stand to less than or equal to 18 sec to demonstrate improved functional strength and transfer efficiency.   Baseline: 21.4 seconds with BUE support Goal status: INITIAL  3.  Pt will improve TUG time to 25 seconds or less with LRAD in order to demo decrease fall risk.  Baseline: 31.5 seconds with SPC and CGA Goal status: INITIAL  4.  Pt will improve gait speed with LRAD to at least 1.5  ft/sec in order to demo  decr fall risk  Baseline: 31.8 seconds = 1.03 ft/sec with SPC and CGA Goal status: INITIAL  5.  Pt will increase BERG balance score to >/=42/56 to demonstrate improved static balance. Baseline: 37/56 (6/2) Goal status: INITIAL   LONG TERM GOALS: Target date: 07/27/2023  Pt will be independent with final HEP for strength, gait, balance in order to build upon functional gains made in therapy. Baseline:  Goal status: INITIAL  2.  Pt will increase BERG balance score to >/=47/56 to demonstrate improved static balance. Baseline: 37/56 (6/2) Goal status: INITIAL  3.  Pt will improve 5x sit<>stand to less than or equal to 15 sec to demonstrate improved functional strength and transfer efficiency.  Baseline: 21.4 seconds with BUE support Goal status: INITIAL  4.  Pt will improve gait speed with LRAD to at least 2.0 ft/sec in order to demo improved community mobility.  Baseline: 31.8 seconds = 1.03 ft/sec with SPC and CGA Goal status: INITIAL  5.  Pt will improve TUG time to 21 seconds or less with LRAD in order to demo decrease fall risk. Baseline: 31.5 seconds with SPC and CGA Goal status: INITIAL    ASSESSMENT:  CLINICAL IMPRESSION: PT only able to assess metric (BERG) not captured on eval due to headache and high blood glucose.  Pt did not take her medication this visit so education provided on medication compliance and monitoring vitals (especially glucose) at home prior to therapy to improve ability to safely participate in PT.  She continue to benefit from skilled PT for LRAD use for improved community management as well as improved upright stability to reduce fall risk.  Will continue per POC.   OBJECTIVE IMPAIRMENTS: Abnormal gait, decreased activity tolerance, decreased balance, decreased cognition, decreased coordination, decreased endurance, decreased knowledge of use of DME, decreased mobility, difficulty walking, decreased strength, decreased safety  awareness, impaired sensation, and postural dysfunction.   ACTIVITY LIMITATIONS: carrying, lifting, bending, standing, squatting, stairs, transfers, and locomotion level  PARTICIPATION LIMITATIONS: meal prep, cleaning,  medication management, driving, shopping, and community activity  PERSONAL FACTORS: Age, Behavior pattern, Past/current experiences, Time since onset of injury/illness/exacerbation, and 3+ comorbidities: CAD, HTN, hx of multiple CVAs, DM II, CKD II, anxiety, PAD, and GERD are also affecting patient's functional outcome.   REHAB POTENTIAL: Good  CLINICAL DECISION MAKING: Evolving/moderate complexity  EVALUATION COMPLEXITY: Moderate  PLAN:  PT FREQUENCY: 2x/week  PT DURATION: 8 weeks  PLANNED INTERVENTIONS: 97164- PT Re-evaluation, 97110-Therapeutic exercises, 97530- Therapeutic activity, 97112- Neuromuscular re-education, 97535- Self Care, 16109- Manual therapy, [phone]- Gait training, Patient/Family education, Balance training, Stair training, and DME instructions  PLAN FOR NEXT SESSION: initiate HEP for strength, balance. Work on [employer], corporate in clinic, stair training    Earlean Glaze, PT, DPT 06/13/2023, 2:08 PM

## 2023-06-14 ENCOUNTER — Other Ambulatory Visit: Payer: Self-pay | Admitting: Internal Medicine

## 2023-06-15 ENCOUNTER — Ambulatory Visit: Payer: Self-pay | Admitting: Cardiovascular Disease

## 2023-06-16 ENCOUNTER — Encounter: Payer: Self-pay | Admitting: Physical Therapy

## 2023-06-16 ENCOUNTER — Ambulatory Visit: Admitting: Physical Therapy

## 2023-06-16 DIAGNOSIS — R2681 Unsteadiness on feet: Secondary | ICD-10-CM

## 2023-06-16 DIAGNOSIS — R262 Difficulty in walking, not elsewhere classified: Secondary | ICD-10-CM

## 2023-06-16 DIAGNOSIS — R2689 Other abnormalities of gait and mobility: Secondary | ICD-10-CM

## 2023-06-16 DIAGNOSIS — M6281 Muscle weakness (generalized): Secondary | ICD-10-CM

## 2023-06-16 NOTE — Patient Instructions (Signed)
 Access Code: [email or web address] URL: [email or web address] Date: 06/16/2023 Prepared by: Marilou Showman  Exercises - Sit to Stand with Arms Crossed  - 1 x daily - 4 x weekly - 2 sets - 8 reps - Standing March with Counter Support  - 1 x daily - 4 x weekly - 2 sets - 20 reps - Standing Hip Abduction with Counter Support  - 1 x daily - 4 x weekly - 2 sets - 8 reps - Corner Balance Feet Together With Eyes Closed  - 1 x daily - 4 x weekly - 1 sets - 3 reps - 30 seconds hold

## 2023-06-16 NOTE — Therapy (Signed)
 OUTPATIENT PHYSICAL THERAPY NEURO TREATMENT   Patient Name: Sarah [name] MRN: [id number] DOB:1945-08-23, 78 y.o., female Today's Date: 06/16/2023   PCP: [place], [name], MD REFERRING PROVIDER: [place], [name], MD   END OF SESSION:  PT End of Session - 06/16/23 1627     Visit Number 3    Number of Visits 17    Date for PT Re-Evaluation 07/31/23    Authorization Type [employer]    PT Start Time 1622    PT Stop Time 1704    PT Time Calculation (min) 42 min    Equipment Utilized During Treatment Gait belt    Activity Tolerance Patient tolerated treatment well;Patient limited by fatigue    Behavior During Therapy [facility] for tasks assessed/performed             Past Medical History:  Diagnosis Date   Bilateral carpal tunnel syndrome 03/27/2019   Boils    Glaucoma    Heart murmur    HTN (hypertension)    Hx of adenomatous colonic polyps    Hyperlipidemia    Osteoporosis    Pneumonia    Stroke (HCC)    Type II or unspecified type diabetes mellitus without mention of complication, uncontrolled    Past Surgical History:  Procedure Laterality Date   ABDOMINAL HYSTERECTOMY     CARPAL TUNNEL RELEASE     right   COLONOSCOPY  12-10-10   per Dr. Arvie Latus, clear, repeat in 7 yrs    CORONARY STENT INTERVENTION N/A 03/11/2020   Procedure: CORONARY STENT INTERVENTION;  Surgeon: Lucendia Rusk, MD;  Location: [facility] INVASIVE CV LAB;  Service: Cardiovascular;  Laterality: N/A;   CORONARY ULTRASOUND/IVUS N/A 03/11/2020   Procedure: Intravascular Ultrasound/IVUS;  Surgeon: Lucendia Rusk, MD;  Location: [facility] INVASIVE CV LAB;  Service: Cardiovascular;  Laterality: N/A;   INCISION AND DRAINAGE PERIRECTAL ABSCESS N/A 09/26/2015   Procedure: IRRIGATION AND DEBRIDEMENT PERIRECTAL ABSCESS;  Surgeon: Derral Flick, MD;  Location: [facility] OR;  Service: General;  Laterality: N/A;   KNEE ARTHROSCOPY     right   LEFT HEART CATH AND CORONARY ANGIOGRAPHY N/A 03/11/2020   Procedure: LEFT  HEART CATH AND CORONARY ANGIOGRAPHY;  Surgeon: Lucendia Rusk, MD;  Location: [facility] INVASIVE CV LAB;  Service: Cardiovascular;  Laterality: N/A;   LOOP RECORDER INSERTION N/A 10/19/2016   Procedure: LOOP RECORDER INSERTION;  Surgeon: Jolly Needle, MD;  Location: [facility] INVASIVE CV LAB;  Service: Cardiovascular;  Laterality: N/A;   ORIF ANKLE FRACTURE Right 03/24/2012   Procedure: OPEN REDUCTION INTERNAL FIXATION (ORIF) ANKLE FRACTURE;  Surgeon: Timothy Ford, MD;  Location: [facility] OR;  Service: Orthopedics;  Laterality: Right;  Open Reduction Internal Fixation Right Bimalleolar ankle fracture   POLYPECTOMY     TEE WITHOUT CARDIOVERSION N/A 10/18/2016   Procedure: TRANSESOPHAGEAL ECHOCARDIOGRAM (TEE);  Surgeon: Elmyra Haggard, MD;  Location: [facility] ENDOSCOPY;  Service: Cardiovascular;  Laterality: N/A;   TEE WITHOUT CARDIOVERSION N/A 10/14/2022   Procedure: TRANSESOPHAGEAL ECHOCARDIOGRAM;  Surgeon: Lenise Quince, MD;  Location: [facility] INVASIVE CV LAB;  Service: Cardiovascular;  Laterality: N/A;   TONSILLECTOMY     Patient Active Problem List   Diagnosis Date Noted   B12 deficiency 04/12/2023   Acute ischemic stroke (HCC) 10/13/2022   Generalized weakness 10/12/2022   Stroke (cerebrum) (HCC) 10/12/2022   Stroke-like symptoms 10/11/2022   Urge urinary incontinence 07/02/2022   Seborrheic keratosis 07/02/2022   Pain of right lower extremity 12/11/2021   Nausea and vomiting in adult  12/11/2021   Elevated troponin    Change in mental status 10/24/2021   Long term current use of antithrombotics/antiplatelets 10/02/2021   Heart murmur 04/08/2021   Dizziness 04/08/2021   Long term (current) use of antithrombotics/antiplatelets 12/16/2020   Chronic constipation 12/16/2020   Colon cancer screening 12/16/2020   History of adenomatous polyp of colon 12/16/2020   Coronary artery disease of native artery of native heart with stable angina pectoris (HCC) 03/04/2020   Diabetes mellitus (HCC) 03/03/2020   Aortic  atherosclerosis (HCC) 02/12/2020   Bilateral leg cramps 01/21/2020   Bilateral lower extremity edema 01/21/2020   Anxiety disorder 01/21/2020   Type 2 diabetes mellitus with hyperglycemia, with long-term current use of insulin  (HCC) 08/15/2019   Type 2 diabetes mellitus with diabetic polyneuropathy, with long-term current use of insulin  (HCC) 08/15/2019   Uncontrolled type 2 diabetes mellitus with hypoglycemia without coma (HCC) 08/15/2019   Hyperlipidemia LDL goal <70 08/15/2019   Bilateral carpal tunnel syndrome 03/27/2019   Osteoarthritis of both knees 11/27/2018   CKD (chronic kidney disease), stage II 11/27/2018   GERD (gastroesophageal reflux disease) 06/26/2018   Homonymous hemianopia, left 02/08/2018   Cerebrovascular accident (CVA) due to embolism of cerebral artery (HCC)    Diabetes mellitus with coincident hypertension (HCC)    Trigger finger, right index finger 10/17/2017   Low back pain 07/29/2017   Carotid artery stenosis 11/02/2016   Left arm weakness    Diastolic dysfunction    Hypertension with heart disease    Acute blood loss anemia    Cerebral infarction due to embolism of right middle cerebral artery (HCC)    Perirectal abscess 09/26/2015   Vitamin D  deficiency 07/23/2015   Osteopenia 02/16/2013   Ankle fracture 01/19/2013   Anemia 09/27/2011   Type II diabetes mellitus with nephropathy (HCC) 09/24/2011   Mixed hyperlipidemia 09/24/2011   Primary hypertension 09/24/2011    ONSET DATE: 05/18/2023  REFERRING DIAG: M79.605 (ICD-10-CM) - Pain of left anterior lower extremity M54.50 (ICD-10-CM) - Bilateral low back pain without sciatica, unspecified chronicity I63.9 (ICD-10-CM) - Cerebrovascular accident (CVA), unspecified mechanism (HCC)    THERAPY DIAG:  Muscle weakness (generalized)  Other abnormalities of gait and mobility  Unsteadiness on feet  Difficulty in walking, not elsewhere classified  Rationale for Evaluation and Treatment:  Rehabilitation  SUBJECTIVE:                                                                                                                                                                                             SUBJECTIVE STATEMENT: She presents alone with her rollator.  No falls or acute changes.  Her glucose sensor says it is expired, but the reading provided say 146 at onset of session.  She has ordered new sensors that should be here no later than 6/7.  Pt accompanied by: self  PERTINENT HISTORY: PMH: CAD, HTN, hx of multiple CVAs, DM II, CKD II, anxiety, PAD, and GERD    history of  several small infarcts at left brain watershed areas and left cerebellum in 10/2022 concerning for cardioembolic source, and hx of punctate left cerebellar and left parietal lobe infarcts in 10/2021 likely incidental finding on MRI during encephalopathy work-up of unclear etiology, hx of right parieto-occipital infarcts in setting of progressive right MCA stenosis in 2020 likely secondary to large vessel disease source and right MCA stroke in 2018 s/p ILR without evidence of A-fib during 4 year monitoring duration   Per PCP: Patient complains of anterior left leg pain for over a week, anterior thigh to pretibial area, sometimes she feels like pain is radiating from her back down her entire her leg.  She says the pain is intermittent, and rates it as an 8/10. It occasionally wakes her up at night.   PAIN:  Are you having pain? No   There were no vitals filed for this visit.    PRECAUTIONS: Fall   FALLS: Has patient fallen in last 6 months? No  LIVING ENVIRONMENT: Lives with: lives with mom (who is 72), has aide that comes in everyday and sister lives down the street and sister comes by everyday  Aide is there M-F, sister on weekends  Lives in: House/apartment Stairs: Yes: Internal: 10 steps; railings on both side and then single railing and External: from front door there are 2 steps, back door is  the garage and another 2 steps; none Has following equipment at home: Single point cane, Walker - 2 wheeled, shower chair, and Grab bars Aide makes the meals, sister sets up weekly medications and does chores  PLOF: Independent with household mobility with device, Independent with community mobility with device, and Needs assistance with homemaking No driving   PATIENT GOALS: Wants to walk better and have better balance   OBJECTIVE:  Note: Objective measures were completed at Evaluation unless otherwise noted.  DIAGNOSTIC FINDINGS: MRI brain 10/12/22: IMPRESSION: 1. Small acute infarcts scattered in the left cerebral hemisphere and left cerebellum. 2. Extensive chronic small vessel disease and right cerebral cortex infarcts.  COGNITION: Overall cognitive status: Impaired   SENSATION: Light touch: Impaired , more difficult to detect in RLE   COORDINATION: Slightly dysmetric movements in BLE    POSTURE: rounded shoulders and forward head   LOWER EXTREMITY MMT:    MMT Right Eval Left Eval  Hip flexion 4 4  Hip extension    Hip abduction 4 4  Hip adduction 4 4  Hip internal rotation    Hip external rotation    Knee flexion 4 4  Knee extension 4+ 4+  Ankle dorsiflexion 4 4  Ankle plantarflexion    Ankle inversion    Ankle eversion    (Blank rows = not tested)  All tested in sitting  Decr DF ROM in R ankle due to hx of surgery   BED MOBILITY:  Pt denies difficulty getting in and out of the bed at home   TRANSFERS: Sit to stand: SBA and CGA  Assistive device utilized: None     Stand to sit: SBA  Assistive device utilized: None      Pt needing BUE support to come to stand  and more reliant on arms, decr forward lean, one episode of CGA in standing for balance, decr eccentric control back to chair   GAIT: Findings: Gait Characteristics: step through pattern, decreased stance time- Right, decreased stance time- Left, decreased stride length, Right foot flat, Left  foot flat, poor foot clearance- Right, and poor foot clearance- Left, Unsteadiness with short shuffled steps  Distance walked: Clinic distances ,  Assistive device utilized:Single point cane,  Level of assistance: CGA, and Comments: Lowered pt's cane as it was initially too high Pt intermittently hitting R foot to cane, needing CGA/min A for balance   FUNCTIONAL TESTS:  5 times sit to stand: 21.4 seconds with BUE support  Timed up and go (TUG): 31.5 seconds with SPC and CGA 10 meter walk test: 31.8 seconds = 1.03 ft/sec with SPC and CGA                                                                                                                                TREATMENT DATE: 06/16/23 -STS BUE support x8 > arms crossed x8, slower weight shift and intermittent legs on chair, but improved with increased reps -Standing march 2x20 w/ BUE support -counter support hip abduction 2x8 each LE -Standing feet apart eyes closed x1 minute, no sway > feet together eyes open x1 minute, mild sway > feet together eyes closed 2x40 seconds, min-mod sway  -SciFit 3x2 minutes on level 1.0 using BUE/BLE for global strengthening, activity tolerance, and reciprocal mobility.  Utilized 1 minute seated rest to manage fatigue.  *Pt needs seated rest breaks due to fatigue throughout.  Time used to educate pt.  PATIENT EDUCATION: Education details: Discussed safety with push to stand and not pulling on rollator.  Brake management of rollator.  Turning AD to line up to sit.  Initial HEP. Person educated: Patient Education method: Explanation Education comprehension: verbalized understanding and needs further education  HOME EXERCISE PROGRAM: *Please print additions in largest format per pt request! Access Code: [email or web address] URL: [email or web address] Date: 06/16/2023 Prepared by: Marilou Showman  Exercises - Sit to Stand with Arms Crossed  - 1 x daily - 4 x weekly - 2 sets - 8 reps - Standing  March with Counter Support  - 1 x daily - 4 x weekly - 2 sets - 20 reps - Standing Hip Abduction with Counter Support  - 1 x daily - 4 x weekly - 2 sets - 8 reps - Corner Balance Feet Together With Eyes Closed  - 1 x daily - 4 x weekly - 1 sets - 3 reps - 30 seconds hold  GOALS: Goals reviewed with patient? Yes  SHORT TERM GOALS: Target date: 06/29/2023  Pt will be independent with initial HEP for strength, gait, balance in order to build upon functional gains made in therapy. Baseline: Goal status: INITIAL  2.  Pt will improve 5x sit<>stand to less than or equal to 18 sec to demonstrate improved  functional strength and transfer efficiency.   Baseline: 21.4 seconds with BUE support Goal status: INITIAL  3.  Pt will improve TUG time to 25 seconds or less with LRAD in order to demo decrease fall risk.  Baseline: 31.5 seconds with SPC and CGA Goal status: INITIAL  4.  Pt will improve gait speed with LRAD to at least 1.5 ft/sec in order to demo  decr fall risk  Baseline: 31.8 seconds = 1.03 ft/sec with SPC and CGA Goal status: INITIAL  5.  Pt will increase BERG balance score to >/=42/56 to demonstrate improved static balance. Baseline: 37/56 (6/2) Goal status: INITIAL   LONG TERM GOALS: Target date: 07/27/2023  Pt will be independent with final HEP for strength, gait, balance in order to build upon functional gains made in therapy. Baseline:  Goal status: INITIAL  2.  Pt will increase BERG balance score to >/=47/56 to demonstrate improved static balance. Baseline: 37/56 (6/2) Goal status: INITIAL  3.  Pt will improve 5x sit<>stand to less than or equal to 15 sec to demonstrate improved functional strength and transfer efficiency.  Baseline: 21.4 seconds with BUE support Goal status: INITIAL  4.  Pt will improve gait speed with LRAD to at least 2.0 ft/sec in order to demo improved community mobility.  Baseline: 31.8 seconds = 1.03 ft/sec with SPC and CGA Goal status:  INITIAL  5.  Pt will improve TUG time to 21 seconds or less with LRAD in order to demo decrease fall risk. Baseline: 31.5 seconds with SPC and CGA Goal status: INITIAL    ASSESSMENT:  CLINICAL IMPRESSION: Emphasis of skilled PT session on progressing pt's endurance and establishing introductory HEP.  She is challenged by continuous activity and visually limited conditions to balance.  Her hips appear functionally weak as noted with STS and she needs ongoing education on safest AD option as she struggles to utilize rollator in safest manner.  PT to continue addressing these and all other outlined deficits in ongoing PT POC.  POC may be limited by patient's fatigue which requires frequent rest breaks.  OBJECTIVE IMPAIRMENTS: Abnormal gait, decreased activity tolerance, decreased balance, decreased cognition, decreased coordination, decreased endurance, decreased knowledge of use of DME, decreased mobility, difficulty walking, decreased strength, decreased safety awareness, impaired sensation, and postural dysfunction.   ACTIVITY LIMITATIONS: carrying, lifting, bending, standing, squatting, stairs, transfers, and locomotion level  PARTICIPATION LIMITATIONS: meal prep, cleaning, medication management, driving, shopping, and community activity  PERSONAL FACTORS: Age, Behavior pattern, Past/current experiences, Time since onset of injury/illness/exacerbation, and 3+ comorbidities: CAD, HTN, hx of multiple CVAs, DM II, CKD II, anxiety, PAD, and GERD are also affecting patient's functional outcome.   REHAB POTENTIAL: Good  CLINICAL DECISION MAKING: Evolving/moderate complexity  EVALUATION COMPLEXITY: Moderate  PLAN:  PT FREQUENCY: 2x/week  PT DURATION: 8 weeks  PLANNED INTERVENTIONS: 97164- PT Re-evaluation, 97110-Therapeutic exercises, 97530- Therapeutic activity, 97112- Neuromuscular re-education, 97535- Self Care, 21308- Manual therapy, [phone]- Gait training, Patient/Family education,  Balance training, Stair training, and DME instructions  PLAN FOR NEXT SESSION: How is HEP for strength, balance - expand? Work on strength/balance/endurance in clinic, stair training; she may benefit from circuit style activity and maybe arm bike vs scifit; check glucose   Earlean Glaze, PT, DPT 06/16/2023, 5:08 PM

## 2023-06-16 NOTE — Progress Notes (Signed)
 Carelink Summary Report / Loop Recorder

## 2023-06-16 NOTE — Addendum Note (Signed)
 Addended by: Lott Rouleau A on: 06/16/2023 02:54 PM   Modules accepted: Orders

## 2023-06-20 ENCOUNTER — Encounter: Payer: Self-pay | Admitting: Physical Therapy

## 2023-06-20 ENCOUNTER — Ambulatory Visit: Admitting: Physical Therapy

## 2023-06-20 VITALS — BP 132/69 | HR 84

## 2023-06-20 DIAGNOSIS — R2681 Unsteadiness on feet: Secondary | ICD-10-CM

## 2023-06-20 DIAGNOSIS — M6281 Muscle weakness (generalized): Secondary | ICD-10-CM | POA: Diagnosis not present

## 2023-06-20 DIAGNOSIS — R2689 Other abnormalities of gait and mobility: Secondary | ICD-10-CM

## 2023-06-20 DIAGNOSIS — R262 Difficulty in walking, not elsewhere classified: Secondary | ICD-10-CM

## 2023-06-20 NOTE — Therapy (Signed)
 OUTPATIENT PHYSICAL THERAPY NEURO TREATMENT   Patient Name: Sarah [name] MRN: [id number] DOB:[birth year], 78 y.o., female Today's Date: 06/20/2023   PCP: [place], [name], MD REFERRING PROVIDER: [place], [name], MD   END OF SESSION:  PT End of Session - 06/20/23 1321     Visit Number 4    Number of Visits 17    Date for PT Re-Evaluation 07/31/23    Authorization Type [employer] Medicare    PT Start Time 1318    PT Stop Time 1358    PT Time Calculation (min) 40 min    Equipment Utilized During Treatment Gait belt    Activity Tolerance Patient tolerated treatment well;Patient limited by fatigue    Behavior During Therapy [facility] for tasks assessed/performed             Past Medical History:  Diagnosis Date   Bilateral carpal tunnel syndrome 03/27/2019   Boils    Glaucoma    Heart murmur    HTN (hypertension)    Hx of adenomatous colonic polyps    Hyperlipidemia    Osteoporosis    Pneumonia    Stroke (HCC)    Type II or unspecified type diabetes mellitus without mention of complication, uncontrolled    Past Surgical History:  Procedure Laterality Date   ABDOMINAL HYSTERECTOMY     CARPAL TUNNEL RELEASE     right   COLONOSCOPY  12-10-10   per Dr. Arvie Latus, clear, repeat in 7 yrs    CORONARY STENT INTERVENTION N/A 03/11/2020   Procedure: CORONARY STENT INTERVENTION;  Surgeon: Lucendia Rusk, MD;  Location: [facility] INVASIVE CV LAB;  Service: Cardiovascular;  Laterality: N/A;   CORONARY ULTRASOUND/IVUS N/A 03/11/2020   Procedure: Intravascular Ultrasound/IVUS;  Surgeon: Lucendia Rusk, MD;  Location: [facility] INVASIVE CV LAB;  Service: Cardiovascular;  Laterality: N/A;   INCISION AND DRAINAGE PERIRECTAL ABSCESS N/A 09/26/2015   Procedure: IRRIGATION AND DEBRIDEMENT PERIRECTAL ABSCESS;  Surgeon: Derral Flick, MD;  Location: [facility] OR;  Service: General;  Laterality: N/A;   KNEE ARTHROSCOPY     right   LEFT HEART CATH AND CORONARY ANGIOGRAPHY N/A 03/11/2020   Procedure: LEFT  HEART CATH AND CORONARY ANGIOGRAPHY;  Surgeon: Lucendia Rusk, MD;  Location: [facility] INVASIVE CV LAB;  Service: Cardiovascular;  Laterality: N/A;   LOOP RECORDER INSERTION N/A 10/19/2016   Procedure: LOOP RECORDER INSERTION;  Surgeon: Jolly Needle, MD;  Location: [facility] INVASIVE CV LAB;  Service: Cardiovascular;  Laterality: N/A;   ORIF ANKLE FRACTURE Right 03/24/2012   Procedure: OPEN REDUCTION INTERNAL FIXATION (ORIF) ANKLE FRACTURE;  Surgeon: Timothy Ford, MD;  Location: [facility] OR;  Service: Orthopedics;  Laterality: Right;  Open Reduction Internal Fixation Right Bimalleolar ankle fracture   POLYPECTOMY     TEE WITHOUT CARDIOVERSION N/A 10/18/2016   Procedure: TRANSESOPHAGEAL ECHOCARDIOGRAM (TEE);  Surgeon: Elmyra Haggard, MD;  Location: [facility] ENDOSCOPY;  Service: Cardiovascular;  Laterality: N/A;   TEE WITHOUT CARDIOVERSION N/A 10/14/2022   Procedure: TRANSESOPHAGEAL ECHOCARDIOGRAM;  Surgeon: Lenise Quince, MD;  Location: [facility] INVASIVE CV LAB;  Service: Cardiovascular;  Laterality: N/A;   TONSILLECTOMY     Patient Active Problem List   Diagnosis Date Noted   B12 deficiency 04/12/2023   Acute ischemic stroke (HCC) 10/13/2022   Generalized weakness 10/12/2022   Stroke (cerebrum) (HCC) 10/12/2022   Stroke-like symptoms 10/11/2022   Urge urinary incontinence 07/02/2022   Seborrheic keratosis 07/02/2022   Pain of right lower extremity 12/11/2021   Nausea and vomiting in adult  12/11/2021   Elevated troponin    Change in mental status 10/24/2021   Long term current use of antithrombotics/antiplatelets 10/02/2021   Heart murmur 04/08/2021   Dizziness 04/08/2021   Long term (current) use of antithrombotics/antiplatelets 12/16/2020   Chronic constipation 12/16/2020   Colon cancer screening 12/16/2020   History of adenomatous polyp of colon 12/16/2020   Coronary artery disease of native artery of native heart with stable angina pectoris (HCC) 03/04/2020   Diabetes mellitus (HCC) 03/03/2020   Aortic  atherosclerosis (HCC) 02/12/2020   Bilateral leg cramps 01/21/2020   Bilateral lower extremity edema 01/21/2020   Anxiety disorder 01/21/2020   Type 2 diabetes mellitus with hyperglycemia, with long-term current use of insulin  (HCC) 08/15/2019   Type 2 diabetes mellitus with diabetic polyneuropathy, with long-term current use of insulin  (HCC) 08/15/2019   Uncontrolled type 2 diabetes mellitus with hypoglycemia without coma (HCC) 08/15/2019   Hyperlipidemia LDL goal <70 08/15/2019   Bilateral carpal tunnel syndrome 03/27/2019   Osteoarthritis of both knees 11/27/2018   CKD (chronic kidney disease), stage II 11/27/2018   GERD (gastroesophageal reflux disease) 06/26/2018   Homonymous hemianopia, left 02/08/2018   Cerebrovascular accident (CVA) due to embolism of cerebral artery (HCC)    Diabetes mellitus with coincident hypertension (HCC)    Trigger finger, right index finger 10/17/2017   Low back pain 07/29/2017   Carotid artery stenosis 11/02/2016   Left arm weakness    Diastolic dysfunction    Hypertension with heart disease    Acute blood loss anemia    Cerebral infarction due to embolism of right middle cerebral artery (HCC)    Perirectal abscess 09/26/2015   Vitamin D  deficiency 07/23/2015   Osteopenia 02/16/2013   Ankle fracture 01/19/2013   Anemia 09/27/2011   Type II diabetes mellitus with nephropathy (HCC) 09/24/2011   Mixed hyperlipidemia 09/24/2011   Primary hypertension 09/24/2011    ONSET DATE: 05/18/2023  REFERRING DIAG: M79.605 (ICD-10-CM) - Pain of left anterior lower extremity M54.50 (ICD-10-CM) - Bilateral low back pain without sciatica, unspecified chronicity I63.9 (ICD-10-CM) - Cerebrovascular accident (CVA), unspecified mechanism (HCC)    THERAPY DIAG:  Muscle weakness (generalized)  Other abnormalities of gait and mobility  Unsteadiness on feet  Difficulty in walking, not elsewhere classified  Rationale for Evaluation and Treatment:  Rehabilitation  SUBJECTIVE:                                                                                                                                                                                             SUBJECTIVE STATEMENT: Ambulates in with rollator. Has been working on her exercises at home. No  falls. Does not know what her glucose is today, still waiting for her sensors to be delivered. Sees the endocrinologist next week.   Pt accompanied by: self  PERTINENT HISTORY: PMH: CAD, HTN, hx of multiple CVAs, DM II, CKD II, anxiety, PAD, and GERD    history of  several small infarcts at left brain watershed areas and left cerebellum in 10/2022 concerning for cardioembolic source, and hx of punctate left cerebellar and left parietal lobe infarcts in 10/2021 likely incidental finding on MRI during encephalopathy work-up of unclear etiology, hx of right parieto-occipital infarcts in setting of progressive right MCA stenosis in 2020 likely secondary to large vessel disease source and right MCA stroke in 2018 s/p ILR without evidence of A-fib during 4 year monitoring duration   Per PCP: Patient complains of anterior left leg pain for over a week, anterior thigh to pretibial area, sometimes she feels like pain is radiating from her back down her entire her leg.  She says the pain is intermittent, and rates it as an 8/10. It occasionally wakes her up at night.   PAIN:  Are you having pain? No   Vitals:   06/20/23 1325  BP: 132/69  Pulse: 84      PRECAUTIONS: Fall   FALLS: Has patient fallen in last 6 months? No  LIVING ENVIRONMENT: Lives with: lives with mom (who is 18), has aide that comes in everyday and sister lives down the street and sister comes by everyday  Aide is there M-F, sister on weekends  Lives in: House/apartment Stairs: Yes: Internal: 10 steps; railings on both side and then single railing and External: from front door there are 2 steps, back door is the garage  and another 2 steps; none Has following equipment at home: Single point cane, Walker - 2 wheeled, shower chair, and Grab bars Aide makes the meals, sister sets up weekly medications and does chores  PLOF: Independent with household mobility with device, Independent with community mobility with device, and Needs assistance with homemaking No driving   PATIENT GOALS: Wants to walk better and have better balance   OBJECTIVE:  Note: Objective measures were completed at Evaluation unless otherwise noted.  DIAGNOSTIC FINDINGS: MRI brain 10/12/22: IMPRESSION: 1. Small acute infarcts scattered in the left cerebral hemisphere and left cerebellum. 2. Extensive chronic small vessel disease and right cerebral cortex infarcts.  COGNITION: Overall cognitive status: Impaired   SENSATION: Light touch: Impaired , more difficult to detect in RLE   COORDINATION: Slightly dysmetric movements in BLE    POSTURE: rounded shoulders and forward head   LOWER EXTREMITY MMT:    MMT Right Eval Left Eval  Hip flexion 4 4  Hip extension    Hip abduction 4 4  Hip adduction 4 4  Hip internal rotation    Hip external rotation    Knee flexion 4 4  Knee extension 4+ 4+  Ankle dorsiflexion 4 4  Ankle plantarflexion    Ankle inversion    Ankle eversion    (Blank rows = not tested)  All tested in sitting  Decr DF ROM in R ankle due to hx of surgery   BED MOBILITY:  Pt denies difficulty getting in and out of the bed at home   TRANSFERS: Sit to stand: SBA and CGA  Assistive device utilized: None     Stand to sit: SBA  Assistive device utilized: None      Pt needing BUE support to come to stand and more reliant  on arms, decr forward lean, one episode of CGA in standing for balance, decr eccentric control back to chair   GAIT: Findings: Gait Characteristics: step through pattern, decreased stance time- Right, decreased stance time- Left, decreased stride length, Right foot flat, Left foot flat,  poor foot clearance- Right, and poor foot clearance- Left, Unsteadiness with short shuffled steps  Distance walked: Clinic distances ,  Assistive device utilized:Single point cane,  Level of assistance: CGA, and Comments: Lowered pt's cane as it was initially too high Pt intermittently hitting R foot to cane, needing CGA/min A for balance   FUNCTIONAL TESTS:  5 times sit to stand: 21.4 seconds with BUE support  Timed up and go (TUG): 31.5 seconds with SPC and CGA 10 meter walk test: 31.8 seconds = 1.03 ft/sec with SPC and CGA                                                                                                                                TREATMENT DATE: 06/20/23  Therapeutic Activity:  Vitals:   06/20/23 1325  BP: 132/69  Pulse: 84    Assessed BP and WNL   Educated on proper brake management with rollator and not pulling on rollator to stand and using BUE from chair   SciFit with BUE/BLE AT gear 1.0 for 6 minutes for strengthening, activity tolerance, reciprocal mobility. Pt reporting RPE as 8/10   At staircase with 6" step: Alternating SLS taps to 6" step 15 reps each side, initially UE support > none, then progressing to 2nd step with 10 reps each side, pt reporting RPE as 7-8/10 afterwards, CGA for balance as needed. O2 after: 98%, HR: 83 bpm With single UE support: 10 reps forward step ups each leg   At edge of mat table:  5 reps sit <> stands with no UE support, performed an additional 10 reps with pt standing on air ex, pt needing additional cues to lean forward in order to prevent BLE bracing on mat table, pt with incr forward posture in standing and needs cues for extension once going to stand     PATIENT EDUCATION: Education details: Continue HEP  Person educated: Patient Education method: Explanation Education comprehension: verbalized understanding and needs further education  HOME EXERCISE PROGRAM: *Please print additions in largest format per pt  request! Access Code: [email or web address] URL: [email or web address] Date: 06/16/2023 Prepared by: Marilou Showman  Exercises - Sit to Stand with Arms Crossed  - 1 x daily - 4 x weekly - 2 sets - 8 reps - Standing March with Counter Support  - 1 x daily - 4 x weekly - 2 sets - 20 reps - Standing Hip Abduction with Counter Support  - 1 x daily - 4 x weekly - 2 sets - 8 reps - Corner Balance Feet Together With Eyes Closed  - 1 x daily - 4 x weekly - 1 sets - 3 reps - 30 seconds hold  GOALS: Goals reviewed with patient? Yes  SHORT TERM GOALS: Target date: 06/29/2023  Pt will be independent with initial HEP for strength, gait, balance in order to build upon functional gains made in therapy. Baseline: Goal status: INITIAL  2.  Pt will improve 5x sit<>stand to less than or equal to 18 sec to demonstrate improved functional strength and transfer efficiency.   Baseline: 21.4 seconds with BUE support Goal status: INITIAL  3.  Pt will improve TUG time to 25 seconds or less with LRAD in order to demo decrease fall risk.  Baseline: 31.5 seconds with SPC and CGA Goal status: INITIAL  4.  Pt will improve gait speed with LRAD to at least 1.5 ft/sec in order to demo  decr fall risk  Baseline: 31.8 seconds = 1.03 ft/sec with SPC and CGA Goal status: INITIAL  5.  Pt will increase BERG balance score to >/=42/56 to demonstrate improved static balance. Baseline: 37/56 (6/2) Goal status: INITIAL   LONG TERM GOALS: Target date: 07/27/2023  Pt will be independent with final HEP for strength, gait, balance in order to build upon functional gains made in therapy. Baseline:  Goal status: INITIAL  2.  Pt will increase BERG balance score to >/=47/56 to demonstrate improved static balance. Baseline: 37/56 (6/2) Goal status: INITIAL  3.  Pt will improve 5x sit<>stand to less than or equal to 15 sec to demonstrate improved functional strength and transfer efficiency.  Baseline: 21.4 seconds  with BUE support Goal status: INITIAL  4.  Pt will improve gait speed with LRAD to at least 2.0 ft/sec in order to demo improved community mobility.  Baseline: 31.8 seconds = 1.03 ft/sec with SPC and CGA Goal status: INITIAL  5.  Pt will improve TUG time to 21 seconds or less with LRAD in order to demo decrease fall risk. Baseline: 31.5 seconds with SPC and CGA Goal status: INITIAL    ASSESSMENT:  CLINICAL IMPRESSION: Today's skilled session focused on functional BLE strengthening and balance tasks with der UE support. Intermittent rest breaks needed due to fatigue, esp after step taps for SLS stability. Pt needing cues throughout session for proper brake management with rollator. Will continue per POC.   OBJECTIVE IMPAIRMENTS: Abnormal gait, decreased activity tolerance, decreased balance, decreased cognition, decreased coordination, decreased endurance, decreased knowledge of use of DME, decreased mobility, difficulty walking, decreased strength, decreased safety awareness, impaired sensation, and postural dysfunction.   ACTIVITY LIMITATIONS: carrying, lifting, bending, standing, squatting, stairs, transfers, and locomotion level  PARTICIPATION LIMITATIONS: meal prep, cleaning, medication management, driving, shopping, and community activity  PERSONAL FACTORS: Age, Behavior pattern, Past/current experiences, Time since onset of injury/illness/exacerbation, and 3+ comorbidities: CAD, HTN, hx of multiple CVAs, DM II, CKD II, anxiety, PAD, and GERD are also affecting patient's functional outcome.   REHAB POTENTIAL: Good  CLINICAL DECISION MAKING: Evolving/moderate complexity  EVALUATION COMPLEXITY: Moderate  PLAN:  PT FREQUENCY: 2x/week  PT DURATION: 8 weeks  PLANNED INTERVENTIONS: 97164- PT Re-evaluation, 97110-Therapeutic exercises, 97530- Therapeutic activity, 97112- Neuromuscular re-education, 97535- Self Care, 47829- Manual therapy, [phone]- Gait training, Patient/Family  education, Balance training, Stair training, and DME instructions  PLAN FOR NEXT SESSION: How is HEP for strength, balance - expand as needed. Work on strength/balance/endurance in clinic, stair training; she may benefit from circuit style activity and maybe arm bike vs scifit; check glucose if able to.    Seabron Cypress, PT, DPT 06/20/2023, 2:16 PM

## 2023-06-24 ENCOUNTER — Ambulatory Visit: Admitting: Physical Therapy

## 2023-06-24 DIAGNOSIS — M6281 Muscle weakness (generalized): Secondary | ICD-10-CM

## 2023-06-24 DIAGNOSIS — R293 Abnormal posture: Secondary | ICD-10-CM

## 2023-06-24 DIAGNOSIS — R2689 Other abnormalities of gait and mobility: Secondary | ICD-10-CM

## 2023-06-24 DIAGNOSIS — R278 Other lack of coordination: Secondary | ICD-10-CM

## 2023-06-24 DIAGNOSIS — R2681 Unsteadiness on feet: Secondary | ICD-10-CM

## 2023-06-24 DIAGNOSIS — R262 Difficulty in walking, not elsewhere classified: Secondary | ICD-10-CM

## 2023-06-24 NOTE — Therapy (Signed)
 OUTPATIENT PHYSICAL THERAPY NEURO TREATMENT - ARRIVE NO CHARGE   Patient Name: Sarah [name] MRN: [id number] DOB:Sep 20, 1945, 78 y.o., female Today's Date: 06/24/2023   PCP: [place], [name], MD REFERRING PROVIDER: [place], [name], MD   END OF SESSION:  PT End of Session - 06/24/23 1440     Visit Number 5    Number of Visits 17    Date for PT Re-Evaluation 07/31/23    Authorization Type [employer] Medicare    PT Start Time 1445    Equipment Utilized During Treatment Gait belt    Activity Tolerance Patient tolerated treatment well;Patient limited by fatigue    Behavior During Therapy [facility] for tasks assessed/performed          Past Medical History:  Diagnosis Date   Bilateral carpal tunnel syndrome 03/27/2019   Boils    Glaucoma    Heart murmur    HTN (hypertension)    Hx of adenomatous colonic polyps    Hyperlipidemia    Osteoporosis    Pneumonia    Stroke (HCC)    Type II or unspecified type diabetes mellitus without mention of complication, uncontrolled    Past Surgical History:  Procedure Laterality Date   ABDOMINAL HYSTERECTOMY     CARPAL TUNNEL RELEASE     right   COLONOSCOPY  12-10-10   per Dr. Arvie Latus, clear, repeat in 7 yrs    CORONARY STENT INTERVENTION N/A 03/11/2020   Procedure: CORONARY STENT INTERVENTION;  Surgeon: Lucendia Rusk, MD;  Location: [facility] INVASIVE CV LAB;  Service: Cardiovascular;  Laterality: N/A;   CORONARY ULTRASOUND/IVUS N/A 03/11/2020   Procedure: Intravascular Ultrasound/IVUS;  Surgeon: Lucendia Rusk, MD;  Location: [facility] INVASIVE CV LAB;  Service: Cardiovascular;  Laterality: N/A;   INCISION AND DRAINAGE PERIRECTAL ABSCESS N/A 09/26/2015   Procedure: IRRIGATION AND DEBRIDEMENT PERIRECTAL ABSCESS;  Surgeon: Derral Flick, MD;  Location: [facility] OR;  Service: General;  Laterality: N/A;   KNEE ARTHROSCOPY     right   LEFT HEART CATH AND CORONARY ANGIOGRAPHY N/A 03/11/2020   Procedure: LEFT HEART CATH AND CORONARY ANGIOGRAPHY;   Surgeon: Lucendia Rusk, MD;  Location: [facility] INVASIVE CV LAB;  Service: Cardiovascular;  Laterality: N/A;   LOOP RECORDER INSERTION N/A 10/19/2016   Procedure: LOOP RECORDER INSERTION;  Surgeon: Jolly Needle, MD;  Location: [facility] INVASIVE CV LAB;  Service: Cardiovascular;  Laterality: N/A;   ORIF ANKLE FRACTURE Right 03/24/2012   Procedure: OPEN REDUCTION INTERNAL FIXATION (ORIF) ANKLE FRACTURE;  Surgeon: Timothy Ford, MD;  Location: [facility] OR;  Service: Orthopedics;  Laterality: Right;  Open Reduction Internal Fixation Right Bimalleolar ankle fracture   POLYPECTOMY     TEE WITHOUT CARDIOVERSION N/A 10/18/2016   Procedure: TRANSESOPHAGEAL ECHOCARDIOGRAM (TEE);  Surgeon: Elmyra Haggard, MD;  Location: [facility] ENDOSCOPY;  Service: Cardiovascular;  Laterality: N/A;   TEE WITHOUT CARDIOVERSION N/A 10/14/2022   Procedure: TRANSESOPHAGEAL ECHOCARDIOGRAM;  Surgeon: Lenise Quince, MD;  Location: [facility] INVASIVE CV LAB;  Service: Cardiovascular;  Laterality: N/A;   TONSILLECTOMY     Patient Active Problem List   Diagnosis Date Noted   B12 deficiency 04/12/2023   Acute ischemic stroke (HCC) 10/13/2022   Generalized weakness 10/12/2022   Stroke (cerebrum) (HCC) 10/12/2022   Stroke-like symptoms 10/11/2022   Urge urinary incontinence 07/02/2022   Seborrheic keratosis 07/02/2022   Pain of right lower extremity 12/11/2021   Nausea and vomiting in adult 12/11/2021   Elevated troponin    Change in mental status 10/24/2021  Long term current use of antithrombotics/antiplatelets 10/02/2021   Heart murmur 04/08/2021   Dizziness 04/08/2021   Long term (current) use of antithrombotics/antiplatelets 12/16/2020   Chronic constipation 12/16/2020   Colon cancer screening 12/16/2020   History of adenomatous polyp of colon 12/16/2020   Coronary artery disease of native artery of native heart with stable angina pectoris (HCC) 03/04/2020   Diabetes mellitus (HCC) 03/03/2020   Aortic atherosclerosis (HCC) 02/12/2020    Bilateral leg cramps 01/21/2020   Bilateral lower extremity edema 01/21/2020   Anxiety disorder 01/21/2020   Type 2 diabetes mellitus with hyperglycemia, with long-term current use of insulin  (HCC) 08/15/2019   Type 2 diabetes mellitus with diabetic polyneuropathy, with long-term current use of insulin  (HCC) 08/15/2019   Uncontrolled type 2 diabetes mellitus with hypoglycemia without coma (HCC) 08/15/2019   Hyperlipidemia LDL goal <70 08/15/2019   Bilateral carpal tunnel syndrome 03/27/2019   Osteoarthritis of both knees 11/27/2018   CKD (chronic kidney disease), stage II 11/27/2018   GERD (gastroesophageal reflux disease) 06/26/2018   Homonymous hemianopia, left 02/08/2018   Cerebrovascular accident (CVA) due to embolism of cerebral artery (HCC)    Diabetes mellitus with coincident hypertension (HCC)    Trigger finger, right index finger 10/17/2017   Low back pain 07/29/2017   Carotid artery stenosis 11/02/2016   Left arm weakness    Diastolic dysfunction    Hypertension with heart disease    Acute blood loss anemia    Cerebral infarction due to embolism of right middle cerebral artery (HCC)    Perirectal abscess 09/26/2015   Vitamin D  deficiency 07/23/2015   Osteopenia 02/16/2013   Ankle fracture 01/19/2013   Anemia 09/27/2011   Type II diabetes mellitus with nephropathy (HCC) 09/24/2011   Mixed hyperlipidemia 09/24/2011   Primary hypertension 09/24/2011    ONSET DATE: 05/18/2023  REFERRING DIAG: M79.605 (ICD-10-CM) - Pain of left anterior lower extremity M54.50 (ICD-10-CM) - Bilateral low back pain without sciatica, unspecified chronicity I63.9 (ICD-10-CM) - Cerebrovascular accident (CVA), unspecified mechanism (HCC)    THERAPY DIAG:  Muscle weakness (generalized)  Other abnormalities of gait and mobility  Unsteadiness on feet  Difficulty in walking, not elsewhere classified  Other lack of coordination  Abnormal posture  Rationale for Evaluation and Treatment:  Rehabilitation  SUBJECTIVE:                                                                                                                                                                                             SUBJECTIVE STATEMENT: Ambulates in with rollator. Has been working on her exercises at home. No falls or close calls since last session.  Pain ISQ. Had nausea and vomiting episode last night after eating pasta. She thinks it was the spaghetti sauce.  Pt accompanied by: self  PERTINENT HISTORY: PMH: CAD, HTN, hx of multiple CVAs, DM II, CKD II, anxiety, PAD, and GERD    history of  several small infarcts at left brain watershed areas and left cerebellum in 10/2022 concerning for cardioembolic source, and hx of punctate left cerebellar and left parietal lobe infarcts in 10/2021 likely incidental finding on MRI during encephalopathy work-up of unclear etiology, hx of right parieto-occipital infarcts in setting of progressive right MCA stenosis in 2020 likely secondary to large vessel disease source and right MCA stroke in 2018 s/p ILR without evidence of A-fib during 4 year monitoring duration   Per PCP: Patient complains of anterior left leg pain for over a week, anterior thigh to pretibial area, sometimes she feels like pain is radiating from her back down her entire her leg.  She says the pain is intermittent, and rates it as an 8/10. It occasionally wakes her up at night.   PAIN:  Are you having pain? No   There were no vitals filed for this visit.     PRECAUTIONS: Fall   FALLS: Has patient fallen in last 6 months? No  LIVING ENVIRONMENT: Lives with: lives with mom (who is 27), has aide that comes in everyday and sister lives down the street and sister comes by everyday  Aide is there M-F, sister on weekends  Lives in: House/apartment Stairs: Yes: Internal: 10 steps; railings on both side and then single railing and External: from front door there are 2 steps, back door  is the garage and another 2 steps; none Has following equipment at home: Single point cane, Walker - 2 wheeled, shower chair, and Grab bars Aide makes the meals, sister sets up weekly medications and does chores  PLOF: Independent with household mobility with device, Independent with community mobility with device, and Needs assistance with homemaking No driving   PATIENT GOALS: Wants to walk better and have better balance   OBJECTIVE:  Note: Objective measures were completed at Evaluation unless otherwise noted.  DIAGNOSTIC FINDINGS: MRI brain 10/12/22: IMPRESSION: 1. Small acute infarcts scattered in the left cerebral hemisphere and left cerebellum. 2. Extensive chronic small vessel disease and right cerebral cortex infarcts.  COGNITION: Overall cognitive status: Impaired   SENSATION: Light touch: Impaired , more difficult to detect in RLE   COORDINATION: Slightly dysmetric movements in BLE    POSTURE: rounded shoulders and forward head   LOWER EXTREMITY MMT:    MMT Right Eval Left Eval  Hip flexion 4 4  Hip extension    Hip abduction 4 4  Hip adduction 4 4  Hip internal rotation    Hip external rotation    Knee flexion 4 4  Knee extension 4+ 4+  Ankle dorsiflexion 4 4  Ankle plantarflexion    Ankle inversion    Ankle eversion    (Blank rows = not tested)  All tested in sitting  Decr DF ROM in R ankle due to hx of surgery   BED MOBILITY:  Pt denies difficulty getting in and out of the bed at home   TRANSFERS: Sit to stand: SBA and CGA  Assistive device utilized: None     Stand to sit: SBA  Assistive device utilized: None      Pt needing BUE support to come to stand and more reliant on arms, decr forward lean, one episode of  CGA in standing for balance, decr eccentric control back to chair   GAIT: Findings: Gait Characteristics: step through pattern, decreased stance time- Right, decreased stance time- Left, decreased stride length, Right foot flat,  Left foot flat, poor foot clearance- Right, and poor foot clearance- Left, Unsteadiness with short shuffled steps  Distance walked: Clinic distances ,  Assistive device utilized:Single point cane,  Level of assistance: CGA, and Comments: Lowered pt's cane as it was initially too high Pt intermittently hitting R foot to cane, needing CGA/min A for balance   FUNCTIONAL TESTS:  5 times sit to stand: 21.4 seconds with BUE support  Timed up and go (TUG): 31.5 seconds with SPC and CGA 10 meter walk test: 31.8 seconds = 1.03 ft/sec with SPC and CGA                                                                                                                                TREATMENT DATE: 06/24/23  PT terminated treatment session as pt was feeling unwell. Had nausea and vomiting the night prior and is still re-cooperating. PT provided pt gingerale.    PATIENT EDUCATION: Education details: Continue HEP  Person educated: Patient Education method: Explanation Education comprehension: verbalized understanding and needs further education  HOME EXERCISE PROGRAM: *Please print additions in largest format per pt request! Access Code: [email or web address] URL: [email or web address] Date: 06/16/2023 Prepared by: Marilou Showman  Exercises - Sit to Stand with Arms Crossed  - 1 x daily - 4 x weekly - 2 sets - 8 reps - Standing March with Counter Support  - 1 x daily - 4 x weekly - 2 sets - 20 reps - Standing Hip Abduction with Counter Support  - 1 x daily - 4 x weekly - 2 sets - 8 reps - Corner Balance Feet Together With Eyes Closed  - 1 x daily - 4 x weekly - 1 sets - 3 reps - 30 seconds hold  GOALS: Goals reviewed with patient? Yes  SHORT TERM GOALS: Target date: 06/29/2023  Pt will be independent with initial HEP for strength, gait, balance in order to build upon functional gains made in therapy. Baseline: Goal status: INITIAL  2.  Pt will improve 5x sit<>stand to less than or equal  to 18 sec to demonstrate improved functional strength and transfer efficiency.   Baseline: 21.4 seconds with BUE support Goal status: INITIAL  3.  Pt will improve TUG time to 25 seconds or less with LRAD in order to demo decrease fall risk.  Baseline: 31.5 seconds with SPC and CGA Goal status: INITIAL  4.  Pt will improve gait speed with LRAD to at least 1.5 ft/sec in order to demo  decr fall risk  Baseline: 31.8 seconds = 1.03 ft/sec with SPC and CGA Goal status: INITIAL  5.  Pt will increase BERG balance score to >/=42/56 to demonstrate improved static balance. Baseline: 37/56 (6/2) Goal status: INITIAL   LONG  TERM GOALS: Target date: 07/27/2023  Pt will be independent with final HEP for strength, gait, balance in order to build upon functional gains made in therapy. Baseline:  Goal status: INITIAL  2.  Pt will increase BERG balance score to >/=47/56 to demonstrate improved static balance. Baseline: 37/56 (6/2) Goal status: INITIAL  3.  Pt will improve 5x sit<>stand to less than or equal to 15 sec to demonstrate improved functional strength and transfer efficiency.  Baseline: 21.4 seconds with BUE support Goal status: INITIAL  4.  Pt will improve gait speed with LRAD to at least 2.0 ft/sec in order to demo improved community mobility.  Baseline: 31.8 seconds = 1.03 ft/sec with SPC and CGA Goal status: INITIAL  5.  Pt will improve TUG time to 21 seconds or less with LRAD in order to demo decrease fall risk. Baseline: 31.5 seconds with SPC and CGA Goal status: INITIAL    ASSESSMENT:  CLINICAL IMPRESSION: See above.   OBJECTIVE IMPAIRMENTS: Abnormal gait, decreased activity tolerance, decreased balance, decreased cognition, decreased coordination, decreased endurance, decreased knowledge of use of DME, decreased mobility, difficulty walking, decreased strength, decreased safety awareness, impaired sensation, and postural dysfunction.   ACTIVITY LIMITATIONS: carrying,  lifting, bending, standing, squatting, stairs, transfers, and locomotion level  PARTICIPATION LIMITATIONS: meal prep, cleaning, medication management, driving, shopping, and community activity  PERSONAL FACTORS: Age, Behavior pattern, Past/current experiences, Time since onset of injury/illness/exacerbation, and 3+ comorbidities: CAD, HTN, hx of multiple CVAs, DM II, CKD II, anxiety, PAD, and GERD are also affecting patient's functional outcome.   REHAB POTENTIAL: Good  CLINICAL DECISION MAKING: Evolving/moderate complexity  EVALUATION COMPLEXITY: Moderate  PLAN:  PT FREQUENCY: 2x/week  PT DURATION: 8 weeks  PLANNED INTERVENTIONS: 97164- PT Re-evaluation, 97110-Therapeutic exercises, 97530- Therapeutic activity, 97112- Neuromuscular re-education, 97535- Self Care, 16109- Manual therapy, [phone]- Gait training, Patient/Family education, Balance training, Stair training, and DME instructions  PLAN FOR NEXT SESSION: How is HEP for strength, balance - expand as needed. Work on strength/balance/endurance in clinic, stair training; she may benefit from circuit style activity and maybe arm bike vs scifit; check glucose if able to.    Barbara Book, Student-PT, DPT 06/24/2023, 2:42 PM

## 2023-06-27 ENCOUNTER — Ambulatory Visit: Admitting: Physical Therapy

## 2023-06-27 ENCOUNTER — Encounter: Payer: Self-pay | Admitting: Physical Therapy

## 2023-06-27 ENCOUNTER — Other Ambulatory Visit: Payer: Self-pay | Admitting: Internal Medicine

## 2023-06-27 ENCOUNTER — Other Ambulatory Visit: Payer: Self-pay | Admitting: Family Medicine

## 2023-06-27 VITALS — BP 125/61 | HR 94

## 2023-06-27 DIAGNOSIS — M6281 Muscle weakness (generalized): Secondary | ICD-10-CM

## 2023-06-27 DIAGNOSIS — R2681 Unsteadiness on feet: Secondary | ICD-10-CM

## 2023-06-27 DIAGNOSIS — R2689 Other abnormalities of gait and mobility: Secondary | ICD-10-CM

## 2023-06-27 NOTE — Therapy (Signed)
 OUTPATIENT PHYSICAL THERAPY NEURO TREATMENT   Patient Name: Sarah [name] MRN: [id number] DOB:June 12, 1945, 78 y.o., female Today's Date: 06/27/2023   PCP: [place], [name], MD REFERRING PROVIDER: [place], [name], MD   END OF SESSION:  PT End of Session - 06/27/23 1533     Visit Number 5    Number of Visits 17    Date for PT Re-Evaluation 07/31/23    Authorization Type [employer] Medicare    PT Start Time 1531    PT Stop Time 1611    PT Time Calculation (min) 40 min    Equipment Utilized During Treatment Gait belt    Activity Tolerance Patient limited by fatigue;Patient tolerated treatment well    Behavior During Therapy [facility] for tasks assessed/performed          Past Medical History:  Diagnosis Date   Bilateral carpal tunnel syndrome 03/27/2019   Boils    Glaucoma    Heart murmur    HTN (hypertension)    Hx of adenomatous colonic polyps    Hyperlipidemia    Osteoporosis    Pneumonia    Stroke (HCC)    Type II or unspecified type diabetes mellitus without mention of complication, uncontrolled    Past Surgical History:  Procedure Laterality Date   ABDOMINAL HYSTERECTOMY     CARPAL TUNNEL RELEASE     right   COLONOSCOPY  12-10-10   per Dr. Arvie Latus, clear, repeat in 7 yrs    CORONARY STENT INTERVENTION N/A 03/11/2020   Procedure: CORONARY STENT INTERVENTION;  Surgeon: Lucendia Rusk, MD;  Location: [facility] INVASIVE CV LAB;  Service: Cardiovascular;  Laterality: N/A;   CORONARY ULTRASOUND/IVUS N/A 03/11/2020   Procedure: Intravascular Ultrasound/IVUS;  Surgeon: Lucendia Rusk, MD;  Location: [facility] INVASIVE CV LAB;  Service: Cardiovascular;  Laterality: N/A;   INCISION AND DRAINAGE PERIRECTAL ABSCESS N/A 09/26/2015   Procedure: IRRIGATION AND DEBRIDEMENT PERIRECTAL ABSCESS;  Surgeon: Derral Flick, MD;  Location: [facility] OR;  Service: General;  Laterality: N/A;   KNEE ARTHROSCOPY     right   LEFT HEART CATH AND CORONARY ANGIOGRAPHY N/A 03/11/2020   Procedure: LEFT HEART  CATH AND CORONARY ANGIOGRAPHY;  Surgeon: Lucendia Rusk, MD;  Location: [facility] INVASIVE CV LAB;  Service: Cardiovascular;  Laterality: N/A;   LOOP RECORDER INSERTION N/A 10/19/2016   Procedure: LOOP RECORDER INSERTION;  Surgeon: Jolly Needle, MD;  Location: [facility] INVASIVE CV LAB;  Service: Cardiovascular;  Laterality: N/A;   ORIF ANKLE FRACTURE Right 03/24/2012   Procedure: OPEN REDUCTION INTERNAL FIXATION (ORIF) ANKLE FRACTURE;  Surgeon: Timothy Ford, MD;  Location: [facility] OR;  Service: Orthopedics;  Laterality: Right;  Open Reduction Internal Fixation Right Bimalleolar ankle fracture   POLYPECTOMY     TEE WITHOUT CARDIOVERSION N/A 10/18/2016   Procedure: TRANSESOPHAGEAL ECHOCARDIOGRAM (TEE);  Surgeon: Elmyra Haggard, MD;  Location: [facility] ENDOSCOPY;  Service: Cardiovascular;  Laterality: N/A;   TEE WITHOUT CARDIOVERSION N/A 10/14/2022   Procedure: TRANSESOPHAGEAL ECHOCARDIOGRAM;  Surgeon: Lenise Quince, MD;  Location: [facility] INVASIVE CV LAB;  Service: Cardiovascular;  Laterality: N/A;   TONSILLECTOMY     Patient Active Problem List   Diagnosis Date Noted   B12 deficiency 04/12/2023   Acute ischemic stroke (HCC) 10/13/2022   Generalized weakness 10/12/2022   Stroke (cerebrum) (HCC) 10/12/2022   Stroke-like symptoms 10/11/2022   Urge urinary incontinence 07/02/2022   Seborrheic keratosis 07/02/2022   Pain of right lower extremity 12/11/2021   Nausea and vomiting in adult 12/11/2021  Elevated troponin    Change in mental status 10/24/2021   Long term current use of antithrombotics/antiplatelets 10/02/2021   Heart murmur 04/08/2021   Dizziness 04/08/2021   Long term (current) use of antithrombotics/antiplatelets 12/16/2020   Chronic constipation 12/16/2020   Colon cancer screening 12/16/2020   History of adenomatous polyp of colon 12/16/2020   Coronary artery disease of native artery of native heart with stable angina pectoris (HCC) 03/04/2020   Diabetes mellitus (HCC) 03/03/2020   Aortic  atherosclerosis (HCC) 02/12/2020   Bilateral leg cramps 01/21/2020   Bilateral lower extremity edema 01/21/2020   Anxiety disorder 01/21/2020   Type 2 diabetes mellitus with hyperglycemia, with long-term current use of insulin  (HCC) 08/15/2019   Type 2 diabetes mellitus with diabetic polyneuropathy, with long-term current use of insulin  (HCC) 08/15/2019   Uncontrolled type 2 diabetes mellitus with hypoglycemia without coma (HCC) 08/15/2019   Hyperlipidemia LDL goal <70 08/15/2019   Bilateral carpal tunnel syndrome 03/27/2019   Osteoarthritis of both knees 11/27/2018   CKD (chronic kidney disease), stage II 11/27/2018   GERD (gastroesophageal reflux disease) 06/26/2018   Homonymous hemianopia, left 02/08/2018   Cerebrovascular accident (CVA) due to embolism of cerebral artery (HCC)    Diabetes mellitus with coincident hypertension (HCC)    Trigger finger, right index finger 10/17/2017   Low back pain 07/29/2017   Carotid artery stenosis 11/02/2016   Left arm weakness    Diastolic dysfunction    Hypertension with heart disease    Acute blood loss anemia    Cerebral infarction due to embolism of right middle cerebral artery (HCC)    Perirectal abscess 09/26/2015   Vitamin D  deficiency 07/23/2015   Osteopenia 02/16/2013   Ankle fracture 01/19/2013   Anemia 09/27/2011   Type II diabetes mellitus with nephropathy (HCC) 09/24/2011   Mixed hyperlipidemia 09/24/2011   Primary hypertension 09/24/2011    ONSET DATE: 05/18/2023  REFERRING DIAG: M79.605 (ICD-10-CM) - Pain of left anterior lower extremity M54.50 (ICD-10-CM) - Bilateral low back pain without sciatica, unspecified chronicity I63.9 (ICD-10-CM) - Cerebrovascular accident (CVA), unspecified mechanism (HCC)    THERAPY DIAG:  Muscle weakness (generalized)  Other abnormalities of gait and mobility  Unsteadiness on feet  Rationale for Evaluation and Treatment: Rehabilitation  SUBJECTIVE:                                                                                                                                                                                              SUBJECTIVE STATEMENT: Ambulates in with rollator. Had a good weekend, feeling better today. Reports her glucose monitor came in and has been doing ok. Sugars  are 155 mg/dL  Pt accompanied by: self  PERTINENT HISTORY: PMH: CAD, HTN, hx of multiple CVAs, DM II, CKD II, anxiety, PAD, and GERD    history of  several small infarcts at left brain watershed areas and left cerebellum in 10/2022 concerning for cardioembolic source, and hx of punctate left cerebellar and left parietal lobe infarcts in 10/2021 likely incidental finding on MRI during encephalopathy work-up of unclear etiology, hx of right parieto-occipital infarcts in setting of progressive right MCA stenosis in 2020 likely secondary to large vessel disease source and right MCA stroke in 2018 s/p ILR without evidence of A-fib during 4 year monitoring duration   Per PCP: Patient complains of anterior left leg pain for over a week, anterior thigh to pretibial area, sometimes she feels like pain is radiating from her back down her entire her leg.  She says the pain is intermittent, and rates it as an 8/10. It occasionally wakes her up at night.   PAIN:  Are you having pain? No   Vitals:   06/27/23 1537  BP: 125/61  Pulse: 94       PRECAUTIONS: Fall   FALLS: Has patient fallen in last 6 months? No  LIVING ENVIRONMENT: Lives with: lives with mom (who is 80), has aide that comes in everyday and sister lives down the street and sister comes by everyday  Aide is there M-F, sister on weekends  Lives in: House/apartment Stairs: Yes: Internal: 10 steps; railings on both side and then single railing and External: from front door there are 2 steps, back door is the garage and another 2 steps; none Has following equipment at home: Single point cane, Walker - 2 wheeled, shower chair, and  Grab bars Aide makes the meals, sister sets up weekly medications and does chores  PLOF: Independent with household mobility with device, Independent with community mobility with device, and Needs assistance with homemaking No driving   PATIENT GOALS: Wants to walk better and have better balance   OBJECTIVE:  Note: Objective measures were completed at Evaluation unless otherwise noted.  DIAGNOSTIC FINDINGS: MRI brain 10/12/22: IMPRESSION: 1. Small acute infarcts scattered in the left cerebral hemisphere and left cerebellum. 2. Extensive chronic small vessel disease and right cerebral cortex infarcts.  COGNITION: Overall cognitive status: Impaired   SENSATION: Light touch: Impaired , more difficult to detect in RLE   COORDINATION: Slightly dysmetric movements in BLE    POSTURE: rounded shoulders and forward head   LOWER EXTREMITY MMT:    MMT Right Eval Left Eval  Hip flexion 4 4  Hip extension    Hip abduction 4 4  Hip adduction 4 4  Hip internal rotation    Hip external rotation    Knee flexion 4 4  Knee extension 4+ 4+  Ankle dorsiflexion 4 4  Ankle plantarflexion    Ankle inversion    Ankle eversion    (Blank rows = not tested)  All tested in sitting  Decr DF ROM in R ankle due to hx of surgery   BED MOBILITY:  Pt denies difficulty getting in and out of the bed at home   TRANSFERS: Sit to stand: SBA and CGA  Assistive device utilized: None     Stand to sit: SBA  Assistive device utilized: None      Pt needing BUE support to come to stand and more reliant on arms, decr forward lean, one episode of CGA in standing for balance, decr eccentric control back to chair  GAIT: Findings: Gait Characteristics: step through pattern, decreased stance time- Right, decreased stance time- Left, decreased stride length, Right foot flat, Left foot flat, poor foot clearance- Right, and poor foot clearance- Left, Unsteadiness with short shuffled steps  Distance walked:  Clinic distances ,  Assistive device utilized:Single point cane,  Level of assistance: CGA, and Comments: Lowered pt's cane as it was initially too high Pt intermittently hitting R foot to cane, needing CGA/min A for balance   FUNCTIONAL TESTS:  5 times sit to stand: 21.4 seconds with BUE support  Timed up and go (TUG): 31.5 seconds with SPC and CGA 10 meter walk test: 31.8 seconds = 1.03 ft/sec with SPC and CGA                                                                                                                                TREATMENT DATE: 06/27/23  Therapeutic Activity:  Vitals:   06/27/23 1537  BP: 125/61  Pulse: 94  Assessed BP and WNL   Goal Assessment: 5x sit <> stand: 21 seconds with BUE support, 23.8 seconds with no UE support (some bracing against mat table) TUG: 31.1 seconds 1st attempt, 27.9 seconds 2nd attempt with rollator Gait speed: 22.1 seconds with with rollator = 1.47 ft/sec  Educated on proper brake management with rollator and not pulling on rollator to stand and using BUE from chair, pt needs these cues throughout session   Cues for heel strike when ambulating with rollator as pt with shuffled steps   Performed 2 sets of 5 reps sit <> stands from chair with no UE support, cues to scoot out towards the edge, have nose over toes and tuck feet back to prevent BLE bracing against mat table. Cued to stand all the way up tall    NMR:  At ballet bar with 6 blaze pods in a semi-circle (2 on 6 step), performed on random hit setting, working on SLS, weight shifting, foot clearance, and visual scanning. Trying to perform without UE support, but pt needing to hold on when performing tap to 6 step, CGA/ min A  for balance Performed 4 bouts of 1 minute: 14 hits, 17 hits, 23 hits, 19 hits  Pt reporting RPE as 8/10, rest break between 2 bouts  Pt more challenged by SLS on LLE    On air ex: with feet apart EO 10 reps head turns, 10 reps head nods, more  unsteady with head turns    PATIENT EDUCATION: Education details: Continue HEP, results of goals  Person educated: Patient Education method: Explanation Education comprehension: verbalized understanding and needs further education  HOME EXERCISE PROGRAM: *Please print additions in largest format per pt request! Access Code: [email or web address] URL: [email or web address] Date: 06/16/2023 Prepared by: Marilou Showman  Exercises - Sit to Stand with Arms Crossed  - 1 x daily - 4 x weekly - 2 sets - 8 reps - Standing March with Counter Support  -  1 x daily - 4 x weekly - 2 sets - 20 reps - Standing Hip Abduction with Counter Support  - 1 x daily - 4 x weekly - 2 sets - 8 reps - Corner Balance Feet Together With Eyes Closed  - 1 x daily - 4 x weekly - 1 sets - 3 reps - 30 seconds hold  GOALS: Goals reviewed with patient? Yes  SHORT TERM GOALS: Target date: 06/29/2023  Pt will be independent with initial HEP for strength, gait, balance in order to build upon functional gains made in therapy. Baseline: Goal status: INITIAL  2.  Pt will improve 5x sit<>stand to less than or equal to 18 sec to demonstrate improved functional strength and transfer efficiency.   Baseline: 21.4 seconds with BUE support  21 seconds with BUE support (6/16) Goal status: NOT MET  3.  Pt will improve TUG time to 25 seconds or less with LRAD in order to demo decrease fall risk.  Baseline: 31.5 seconds with SPC and CGA  27.9 seconds with rollator with supervision  Goal status: NOT MET  4.  Pt will improve gait speed with LRAD to at least 1.5 ft/sec in order to demo  decr fall risk  Baseline: 31.8 seconds = 1.03 ft/sec with SPC and CGA   22.1 seconds with with rollator = 1.47 ft/sec with supervision Goal status: MET  5.  Pt will increase BERG balance score to >/=42/56 to demonstrate improved static balance. Baseline: 37/56 (6/2) Goal status: INITIAL   LONG TERM GOALS: Target date:  07/27/2023  Pt will be independent with final HEP for strength, gait, balance in order to build upon functional gains made in therapy. Baseline:  Goal status: INITIAL  2.  Pt will increase BERG balance score to >/=47/56 to demonstrate improved static balance. Baseline: 37/56 (6/2) Goal status: INITIAL  3.  Pt will improve 5x sit<>stand to less than or equal to 15 sec to demonstrate improved functional strength and transfer efficiency.  Baseline: 21.4 seconds with BUE support Goal status: INITIAL  4.  Pt will improve gait speed with LRAD to at least 2.0 ft/sec in order to demo improved community mobility.  Baseline: 31.8 seconds = 1.03 ft/sec with SPC and CGA Goal status: INITIAL  5.  Pt will improve TUG time to 21 seconds or less with LRAD in order to demo decrease fall risk. Baseline: 31.5 seconds with SPC and CGA Goal status: INITIAL    ASSESSMENT:  CLINICAL IMPRESSION: Today's skilled session focused on assessing STGs. Pt did not meet STGs #2 and #3 in regards to TUG and 5x sit <> stand. Pt performed 5x sit <> stand with BUE support today in 21 seconds (previously 21.4 seconds). However, when assessed without UE support, pt able to perform in 24 seconds. Pt met STG #4 in regards to gait speed, pt improved to 1.47 ft/sec with rollator and supervision. Remainder of session focused on balance tasks with compliant surfaces and SLS blaze pod task. Pt more challenged with SLS tasks on LLE. Pt continues to need cues during session for brake management with rollator for incr safety. Will continue per POC.    OBJECTIVE IMPAIRMENTS: Abnormal gait, decreased activity tolerance, decreased balance, decreased cognition, decreased coordination, decreased endurance, decreased knowledge of use of DME, decreased mobility, difficulty walking, decreased strength, decreased safety awareness, impaired sensation, and postural dysfunction.   ACTIVITY LIMITATIONS: carrying, lifting, bending, standing,  squatting, stairs, transfers, and locomotion level  PARTICIPATION LIMITATIONS: meal prep, cleaning, medication management, driving,  shopping, and community activity  PERSONAL FACTORS: Age, Behavior pattern, Past/current experiences, Time since onset of injury/illness/exacerbation, and 3+ comorbidities: CAD, HTN, hx of multiple CVAs, DM II, CKD II, anxiety, PAD, and GERD are also affecting patient's functional outcome.   REHAB POTENTIAL: Good  CLINICAL DECISION MAKING: Evolving/moderate complexity  EVALUATION COMPLEXITY: Moderate  PLAN:  PT FREQUENCY: 2x/week  PT DURATION: 8 weeks  PLANNED INTERVENTIONS: 97164- PT Re-evaluation, 97110-Therapeutic exercises, 97530- Therapeutic activity, 97112- Neuromuscular re-education, 97535- Self Care, 16109- Manual therapy, [phone]- Gait training, Patient/Family education, Balance training, Stair training, and DME instructions  PLAN FOR NEXT SESSION: How is HEP for strength, balance - expand as needed. Work on strength/balance/endurance in clinic, stair training; she may benefit from circuit style activity and maybe arm bike vs scifit; check glucose if able to.   Check last goal in regards to [facility], PT, DPT 06/27/2023, 4:20 PM

## 2023-06-29 ENCOUNTER — Encounter: Payer: Self-pay | Admitting: Internal Medicine

## 2023-06-29 ENCOUNTER — Ambulatory Visit (INDEPENDENT_AMBULATORY_CARE_PROVIDER_SITE_OTHER): Payer: Medicare Other | Admitting: Internal Medicine

## 2023-06-29 VITALS — BP 120/68 | HR 85 | Ht 60.0 in | Wt 93.2 lb

## 2023-06-29 DIAGNOSIS — E1165 Type 2 diabetes mellitus with hyperglycemia: Secondary | ICD-10-CM

## 2023-06-29 DIAGNOSIS — E1142 Type 2 diabetes mellitus with diabetic polyneuropathy: Secondary | ICD-10-CM | POA: Diagnosis not present

## 2023-06-29 DIAGNOSIS — Z794 Long term (current) use of insulin: Secondary | ICD-10-CM | POA: Diagnosis not present

## 2023-06-29 DIAGNOSIS — E1159 Type 2 diabetes mellitus with other circulatory complications: Secondary | ICD-10-CM | POA: Diagnosis not present

## 2023-06-29 LAB — POCT GLYCOSYLATED HEMOGLOBIN (HGB A1C): Hemoglobin A1C: 6.8 % — AB (ref 4.0–5.6)

## 2023-06-29 MED ORDER — TRULICITY 0.75 MG/0.5ML ~~LOC~~ SOAJ: 0.7500 mg | SUBCUTANEOUS | 3 refills | Status: DC

## 2023-06-29 NOTE — Progress Notes (Incomplete)
 HPI: [name] is a 78 y.o. female, who is here today for chronic disease management.  Last seen on ***  *** Review of Systems See other pertinent positives and negatives in HPI.  Current Outpatient Medications on File Prior to Visit  Medication Sig Dispense Refill   amLODipine  (NORVASC ) 5 MG tablet Take 1 tablet (5 mg total) by mouth daily. 90 tablet 3   aspirin  EC 81 MG tablet Take 1 tablet (81 mg total) by mouth daily. Swallow whole. 30 tablet 12   cholecalciferol (VITAMIN D3) 25 MCG (1000 UNIT) tablet Take 1,000 Units by mouth daily.     clopidogrel  (PLAVIX ) 75 MG tablet TAKE 1 TABLET BY MOUTH DAILY 90 tablet 1   Continuous Glucose Receiver (DEXCOM G7 RECEIVER) DEVI USE TO MONITOR GLUCOSE 1 each 0   Continuous Glucose Sensor (DEXCOM G7 SENSOR) MISC USE AS DIRECTED 9 each 3   dapagliflozin  propanediol (FARXIGA ) 10 MG TABS tablet TAKE 1 TABLET BY MOUTH DAILY 90 tablet 1   Dulaglutide  (TRULICITY ) 0.75 MG/0.5ML SOAJ Inject 0.75 mg into the skin once a week. 6 mL 3   ezetimibe  (ZETIA ) 10 MG tablet Take 1 tablet (10 mg total) by mouth daily. 90 tablet 3   furosemide  (LASIX ) 20 MG tablet Take 1 tablet by mouth daily (only as needed) 90 tablet 3   insulin  glargine, 1 Unit Dial , (TOUJEO  SOLOSTAR) 300 UNIT/ML Solostar Pen Inject 14 Units into the skin daily in the afternoon. 15 mL 3   insulin  lispro (HUMALOG  KWIKPEN) 100 UNIT/ML KwikPen Max daily 30 units (Patient taking differently: Inject 6 Units into the skin 3 (three) times daily. Max daily 30 units) 30 mL 3   latanoprost  (XALATAN ) 0.005 % ophthalmic solution Place 1 drop into both eyes at bedtime.     losartan  (COZAAR ) 50 MG tablet TAKE 1 TABLET BY MOUTH DAILY 90 tablet 3   Magnesium 250 MG TABS Take 250 mg by mouth daily.     nitroGLYCERIN  (NITROSTAT ) 0.4 MG SL tablet Place 1 tablet (0.4 mg total) under the tongue every 5 (five) minutes as needed for chest pain. one tab every 5 minutes up to 3 tablets total over 15  minutes. 25 tablet 3   ondansetron  (ZOFRAN ) 4 MG tablet Take 1 tablet (4 mg total) by mouth 2 (two) times daily as needed for nausea or vomiting. 30 tablet 1   potassium chloride  SA (KLOR-CON  M) 20 MEQ tablet Take 1 tablet (20 mEq total) by mouth daily for 5 days. 5 tablet 0   rosuvastatin  (CRESTOR ) 20 MG tablet TAKE 1 TABLET BY MOUTH DAILY 90 tablet 3   sertraline  (ZOLOFT ) 50 MG tablet TAKE 1 TABLET BY MOUTH DAILY 90 tablet 3   topiramate  (TOPAMAX ) 50 MG tablet Take 1 tablet (50 mg total) by mouth at bedtime. 30 tablet 5   No current facility-administered medications on file prior to visit.    Past Medical History:  Diagnosis Date   Bilateral carpal tunnel syndrome 03/27/2019   Boils    Glaucoma    Heart murmur    HTN (hypertension)    Hx of adenomatous colonic polyps    Hyperlipidemia    Osteoporosis    Pneumonia    Stroke (HCC)    Type II or unspecified type diabetes mellitus without mention of complication, uncontrolled    Allergies  Allergen Reactions   Penicillins Other (See Comments)    Does not know reaction    Social History   Socioeconomic History  Marital status: Single    Spouse name: Not on file   Number of children: Not on file   Years of education: Not on file   Highest education level: Associate degree: occupational, technical, or vocational program  Occupational History   Not on file  Tobacco Use   Smoking status: Former    Current packs/day: 0.00    Types: Cigarettes    Quit date: 10/15/2016    Years since quitting: 6.7   Smokeless tobacco: Never   Tobacco comments:    smokes occ.   Vaping Use   Vaping status: Never Used  Substance and Sexual Activity   Alcohol  use: Not Currently    Comment: occ   Drug use: No   Sexual activity: Not on file  Other Topics Concern   Not on file  Social History Narrative   Caffiene coffee 1 cup in am.   Retired from [employer].     Social Drivers of Corporate investment banker Strain: Low Risk   (05/16/2023)   Overall Financial Resource Strain (CARDIA)    Difficulty of Paying Living Expenses: Not hard at all  Food Insecurity: No Food Insecurity (05/16/2023)   Hunger Vital Sign    Worried About Running Out of Food in the Last Year: Never true    Ran Out of Food in the Last Year: Never true  Transportation Needs: No Transportation Needs (05/16/2023)   PRAPARE - Administrator, Civil Service (Medical): No    Lack of Transportation (Non-Medical): No  Physical Activity: Insufficiently Active (05/16/2023)   Exercise Vital Sign    Days of Exercise per Week: 2 days    Minutes of Exercise per Session: 60 min  Stress: No Stress Concern Present (05/16/2023)   [employer] of Occupational Health - Occupational Stress Questionnaire    Feeling of Stress : Not at all  Recent Concern: Stress - Stress Concern Present (05/09/2023)   [employer] of Occupational Health - Occupational Stress Questionnaire    Feeling of Stress : To some extent  Social Connections: Socially Isolated (05/16/2023)   Social Connection and Isolation Panel    Frequency of Communication with Friends and Family: More than three times a week    Frequency of Social Gatherings with Friends and Family: More than three times a week    Attends Religious Services: Never    Database administrator or Organizations: No    Attends Banker Meetings: Never    Marital Status: Widowed    There were no vitals filed for this visit. There is no height or weight on file to calculate BMI.  Physical Exam  ASSESSMENT AND PLAN:  @ASSESSPLAN @  No orders of the defined types were placed in this encounter.   No problem-specific Assessment & Plan notes found for this encounter.   No follow-ups on file.  Sarah G. [place], MD  [facility]. [place] office.

## 2023-06-29 NOTE — Patient Instructions (Addendum)
-   A1c 6.8%, keep up the good work -  Decrease Trulicity  0.75 mg weekly  - Continue Toujeo  20 units once daily  - Continue  Farxiga  10 mg, 1 tablet daily  - Increase Humalog  10 units with each meal PLUS the scale below if needed - Humalog  correctional insulin : ADD extra units on insulin  to your meal-time Humalog  dose if your blood sugars are higher than 170. Use the scale below to help guide you:   Blood sugar before meal Number of units to inject  Less than 170 0 unit  171 - 210 1 units  211 - 250 2 units  251 - 290 3 units  291 - 330 4 units  331 - 370 5 units  371 - 410 6 units       HOW TO TREAT LOW BLOOD SUGARS (Blood sugar LESS THAN 70 MG/DL) Please follow the RULE OF 15 for the treatment of hypoglycemia treatment (when your (blood sugars are less than 70 mg/dL)   STEP 1: Take 15 grams of carbohydrates when your blood sugar is low, which includes:  3-4 GLUCOSE TABS  OR 3-4 OZ OF JUICE OR REGULAR SODA OR ONE TUBE OF GLUCOSE GEL    STEP 2: RECHECK blood sugar in 15 MINUTES STEP 3: If your blood sugar is still low at the 15 minute recheck --> then, go back to STEP 1 and treat AGAIN with another 15 grams of carbohydrates.

## 2023-06-29 NOTE — Progress Notes (Signed)
 Name: Sarah [name]  Age/ Sex: 78 y.o., female   MRN/ DOB: [id number], [birth year]     PCP: [place], [name], MD   Reason for Endocrinology Evaluation: Type 2 Diabetes Mellitus  Initial Endocrine Consultative Visit: 08/15/2019    PATIENT IDENTIFIER: Sarah [name] is a 79 y.o. female with a past medical history of T2DM, CVA, Dyslipidemia  And CAD (S/P PCI 03/2020). The patient has followed with Endocrinology clinic since 08/15/2019 for consultative assistance with management of her diabetes.  DIABETIC HISTORY:  Sarah [name] was diagnosed with DM many years ago. Her hemoglobin A1c has ranged from 10.3% in 2020, peaking at 16.3% in 2016.   On her initial visit to our clinic she had an A1c of 12.5 % She was on MDI regimen but was not taking Metformin  due to GI side effects so we stopped it. We adjusted MDI regimen and continued Victoza     Started Farxiga  02/2020    Lives with mother who is 92 yrs ago   She was hospitalized 10/2022 for acute CVA.  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 Continues to follow up with [name]   Ethelene Herald  was discontinued by nephrology  Attempted to switch Victoza  to Mounjaro  02/2023 but this was not covered, and we started her on Trulicity    SUBJECTIVE:   During the last visit (03/01/2023): A1c 8.5 %    Today (06/29/2023): Sarah [name] is here for a follow up on diabetes management. She is accompanied by her sister ( Sarah [name])from [place]  today .   She had a follow-up with cardiology 02/21/2023 for CAD, HTN, and dyslipidemia She also had a follow-up with neurology for history of CVA   She had episodes of nausea and vomiting, no heartburn  Continues with chronic occasional constipation - she is on miralax  and dulcoloax    HOME DIABETES REGIMEN:   Trulicity  1.5 mg weekly Toujeo  20 units daily Humalog  8 units with each meal  Farxiga  10 mg daily  Correction factor: Humalog  (BG -130/30)     Statin: yes ACE-I/ARB: yes    CONTINUOUS GLUCOSE MONITORING RECORD INTERPRETATION    Dates of Recording: 6/5-6/18/2025  Sensor description:dexcom  Results statistics:   CGM use % of time 90  Average and SD 157/38  Time in range  77 %  % Time Above 180 20  % Time above 250 3  % Time Below target 0   Glycemic patterns summary: BGs are optimal overnight and fluctuate during the day Hyperglycemic episodes postprandial Hypoglycemic episodes occurred N/A  Overnight periods: Optimal   DIABETIC COMPLICATIONS: Microvascular complications:  Retinopathy, neuropathy  Denies: CKD Last Eye Exam: Completed 12/24/2020  Macrovascular complications:  CVA, CAD (S/P PCI 03/2020 Denies: PVD   HISTORY:  Past Medical History:  Past Medical History:  Diagnosis Date   Bilateral carpal tunnel syndrome 03/27/2019   Boils    Glaucoma    Heart murmur    HTN (hypertension)    Hx of adenomatous colonic polyps    Hyperlipidemia    Osteoporosis    Pneumonia    Stroke (HCC)    Type II or unspecified type diabetes mellitus without mention of complication, uncontrolled    Past Surgical History:  Past Surgical History:  Procedure Laterality Date   ABDOMINAL HYSTERECTOMY     CARPAL TUNNEL RELEASE  right   COLONOSCOPY  12-10-10   per Dr. Arvie Latus, clear, repeat in 7 yrs    CORONARY STENT INTERVENTION N/A 03/11/2020   Procedure: CORONARY STENT INTERVENTION;  Surgeon: Lucendia Rusk, MD;  Location: [facility] INVASIVE CV LAB;  Service: Cardiovascular;  Laterality: N/A;   CORONARY ULTRASOUND/IVUS N/A 03/11/2020   Procedure: Intravascular Ultrasound/IVUS;  Surgeon: Lucendia Rusk, MD;  Location: [facility] INVASIVE CV LAB;  Service: Cardiovascular;  Laterality: N/A;   INCISION AND DRAINAGE PERIRECTAL ABSCESS N/A 09/26/2015   Procedure: IRRIGATION AND  DEBRIDEMENT PERIRECTAL ABSCESS;  Surgeon: Derral Flick, MD;  Location: [facility] OR;  Service: General;  Laterality: N/A;   KNEE ARTHROSCOPY     right   LEFT HEART CATH AND CORONARY ANGIOGRAPHY N/A 03/11/2020   Procedure: LEFT HEART CATH AND CORONARY ANGIOGRAPHY;  Surgeon: Lucendia Rusk, MD;  Location: [facility] INVASIVE CV LAB;  Service: Cardiovascular;  Laterality: N/A;   LOOP RECORDER INSERTION N/A 10/19/2016   Procedure: LOOP RECORDER INSERTION;  Surgeon: Jolly Needle, MD;  Location: [facility] INVASIVE CV LAB;  Service: Cardiovascular;  Laterality: N/A;   ORIF ANKLE FRACTURE Right 03/24/2012   Procedure: OPEN REDUCTION INTERNAL FIXATION (ORIF) ANKLE FRACTURE;  Surgeon: Timothy Ford, MD;  Location: [facility] OR;  Service: Orthopedics;  Laterality: Right;  Open Reduction Internal Fixation Right Bimalleolar ankle fracture   POLYPECTOMY     TEE WITHOUT CARDIOVERSION N/A 10/18/2016   Procedure: TRANSESOPHAGEAL ECHOCARDIOGRAM (TEE);  Surgeon: Elmyra Haggard, MD;  Location[facility] ENDOSCOPY;  Service: Cardiovascular;  Laterality: N/A;   TEE WITHOUT CARDIOVERSION N/A 10/14/2022   Procedure: TRANSESOPHAGEAL ECHOCARDIOGRAM;  Surgeon: Lenise Quince, MD;  Location: [facility] INVASIVE CV LAB;  Service: Cardiovascular;  Laterality: N/A;   TONSILLECTOMY     Social History:  reports that she quit smoking about 6 years ago. Her smoking use included cigarettes. She has never used smokeless tobacco. She reports that she does not currently use alcohol . She reports that she does not use drugs. Family History:  Family History  Problem Relation Age of Onset   Diabetes Mother    Hypertension Mother    Stroke Father    Stroke Maternal Uncle    Stroke Paternal Uncle    Colon cancer Neg Hx    Esophageal cancer Neg Hx    Rectal cancer Neg Hx    Stomach cancer Neg Hx    Inflammatory bowel disease Neg Hx    Liver disease Neg Hx    Pancreatic cancer Neg Hx      HOME MEDICATIONS: Allergies as of 06/29/2023       Reactions    Penicillins Other (See Comments)   Does not know reaction        Medication List        Accurate as of June 29, 2023  1:46 PM. If you have any questions, ask your nurse or doctor.          amLODipine  5 MG tablet Commonly known as: NORVASC  Take 1 tablet (5 mg total) by mouth daily.   aspirin  EC 81 MG tablet Take 1 tablet (81 mg total) by mouth daily. Swallow whole.   cholecalciferol 25 MCG (1000 UNIT) tablet Commonly known as: VITAMIN D3 Take 1,000 Units by mouth daily.   clopidogrel  75 MG tablet Commonly known as: PLAVIX  Take 1 tablet (75 mg total) by mouth daily. Start AFTER completing the brilinta  prescription   Dexcom G7 Receiver Devi USE TO MONITOR GLUCOSE   Dexcom G7 Sensor Misc USE AS DIRECTED  ezetimibe  10 MG tablet Commonly known as: ZETIA  Take 1 tablet (10 mg total) by mouth daily.   Farxiga  10 MG Tabs tablet Generic drug: dapagliflozin  propanediol TAKE 1 TABLET BY MOUTH DAILY   furosemide  20 MG tablet Commonly known as: LASIX  Take 1 tablet by mouth daily (only as needed)   insulin  lispro 100 UNIT/ML KwikPen Commonly known as: HumaLOG  KwikPen Max daily 30 units What changed:  how much to take how to take this when to take this   latanoprost  0.005 % ophthalmic solution Commonly known as: XALATAN  Place 1 drop into both eyes at bedtime.   losartan  50 MG tablet Commonly known as: COZAAR  TAKE 1 TABLET BY MOUTH DAILY   Magnesium 250 MG Tabs Take 250 mg by mouth daily.   nitroGLYCERIN  0.4 MG SL tablet Commonly known as: NITROSTAT  Place 1 tablet (0.4 mg total) under the tongue every 5 (five) minutes as needed for chest pain. one tab every 5 minutes up to 3 tablets total over 15 minutes.   ondansetron  4 MG tablet Commonly known as: Zofran  Take 1 tablet (4 mg total) by mouth 2 (two) times daily as needed for nausea or vomiting.   potassium chloride  SA 20 MEQ tablet Commonly known as: KLOR-CON  M Take 1 tablet (20 mEq total) by mouth daily  for 5 days.   rosuvastatin  20 MG tablet Commonly known as: CRESTOR  TAKE 1 TABLET BY MOUTH DAILY   sertraline  50 MG tablet Commonly known as: ZOLOFT  TAKE 1 TABLET BY MOUTH DAILY   topiramate  50 MG tablet Commonly known as: Topamax  Take 1 tablet (50 mg total) by mouth at bedtime.   Toujeo  SoloStar 300 UNIT/ML Solostar Pen Generic drug: insulin  glargine (1 Unit Dial ) Inject 14 Units into the skin daily in the afternoon.   Trulicity  1.5 MG/0.5ML Soaj Generic drug: Dulaglutide  Inject 1.5 mg into the skin once a week.         OBJECTIVE:   Vital Signs: BP 120/68 (BP Location: Left Arm, Patient Position: Sitting, Cuff Size: Normal)   Pulse 85   Ht 5' (1.524 m)   Wt 93 lb 3.2 oz (42.3 kg)   SpO2 97%   BMI 18.20 kg/m   Wt Readings from Last 3 Encounters:  06/29/23 93 lb 3.2 oz (42.3 kg)  06/10/23 97 lb 3.2 oz (44.1 kg)  05/30/23 98 lb 6.4 oz (44.6 kg)     Exam: General: Pt appears well and is in NAD  Lungs: Clear with good BS bilat   Heart: RRR   Extremities: No pretibial edema.   Neuro: MS is good with appropriate affect, pt is alert and Ox3   DM foot exam: 03/01/2023 The skin of the feet is intact without sores or ulcerations. The pedal pulses are 2+ on right and 2+ on left. The sensation is intact to a screening 5.07, 10 gram monofilament bilaterally    DATA REVIEWED:  Lab Results  Component Value Date   HGBA1C 6.8 (A) 06/29/2023   HGBA1C 8.5 (A) 03/01/2023   HGBA1C 12.2 (H) 10/12/2022    Latest Reference Range & Units 02/21/23 14:03  Sodium 134 - 144 mmol/L 139  Potassium 3.5 - 5.2 mmol/L 4.8  Chloride 96 - 106 mmol/L 104  CO2 20 - 29 mmol/L 19 (L)  Glucose 70 - 99 mg/dL 578 (H)  BUN 8 - 27 mg/dL 21  Creatinine 4.69 - 6.29 mg/dL 5.28  Calcium  8.7 - 10.3 mg/dL 9.6  BUN/Creatinine Ratio 12 - 28  21  eGFR >59  mL/min/1.73 60   Old records , labs and images have been reviewed.    ASSESSMENT / PLAN / RECOMMENDATIONS:   1) Type 2 Diabetes Mellitus,  optimally controlled, With neuropathic and  macrovascular  complications - Most recent A1c of 6.8%. Goal A1c < 7.0 %.    -A1c down from 8.5% to 6.8% -The patient does have a home care agent 8 hours every day during weekdays, but the aide is only able to remind the patient to take the medication but does not administer medications for her -Patient does endorse nausea and vomiting on average once daily or once every few days, it is unclear to me if this is related to Trulicity  but of note the patient was having GI issues prior to starting Trulicity , I have recommended decreasing Trulicity  as below and increasing Humalog  as below   MEDICATIONS: -Decrease Trulicity  0.75 mg weekly -Continue Farxiga   10 mg, 1 tablet daily  -Continue Toujeo  20 units daily -Increase Humalog  10 units TIDQAC  -Continue CF : Humalog  (BG-130/30) TIDQAC   EDUCATION / INSTRUCTIONS: BG monitoring instructions: Patient is instructed to check her blood sugars 3 times a day, before meals  Call [facility] if: BG persistently < 70 I reviewed the Rule of 15 for the treatment of hypoglycemia in detail with the patient. Literature supplied.   2) Diabetic complications:  Eye: there's a questionable diabetic retinopathy from her history Neuro/ Feet: Does have known diabetic peripheral neuropathy .  Renal: Patient does not have known baseline CKD. She   is  on an ACEI/ARB at present.    F/U in 4 months     Signed electronically by: Natale Bail, MD  [facility] End[facility]  [facility] [address] [place] [place], [place] [address] Phone: [phone] FAX: [phone]   CC: [place], [name], MD [address] [place] [address] Phone: [phone]  Fax: [phone]  Return to Endocrinology clinic as below: Future Appointments  Date Time Provider Department Center  07/01/2023  3:30 PM Earlean Glaze, [place] [facility]  07/04/2023  2:45 PM Seabron Cypress, PT [facility]  07/06/2023 10:30 AM [place], [name], MD [facility]  07/07/2023  7:15 AM [facility] HVT DEVICE REMOTES [facility] H&V  07/08/2023  2:00 PM Seabron Cypress, PT [facility]  07/11/2023  2:45 PM Seabron Cypress, PT [facility]  07/14/2023  4:15 PM Earlean Glaze, PT [facility]  08/08/2023  7:20 AM CVD HVT DEVICE REMOTES [facility] H&V  09/08/2023  7:15 AM CVD HVT DEVICE REMOTES [facility] H&V  09/30/2023  1:05 PM [facility]-ECHO 5 [facility] H&V  10/06/2023  1:45 PM [name], Jessica, NP [facility] None  10/10/2023  7:15 AM CVD HVT DEVICE REMOTES [facility] H&V  05/21/2024  3:40 PM [facility]-ANNUAL WELLNESS VISIT [facility]

## 2023-07-01 ENCOUNTER — Encounter: Payer: Self-pay | Admitting: Physical Therapy

## 2023-07-01 ENCOUNTER — Ambulatory Visit: Admitting: Physical Therapy

## 2023-07-01 DIAGNOSIS — R293 Abnormal posture: Secondary | ICD-10-CM

## 2023-07-01 DIAGNOSIS — R2681 Unsteadiness on feet: Secondary | ICD-10-CM

## 2023-07-01 DIAGNOSIS — M6281 Muscle weakness (generalized): Secondary | ICD-10-CM | POA: Diagnosis not present

## 2023-07-01 DIAGNOSIS — R278 Other lack of coordination: Secondary | ICD-10-CM

## 2023-07-01 DIAGNOSIS — R262 Difficulty in walking, not elsewhere classified: Secondary | ICD-10-CM

## 2023-07-01 DIAGNOSIS — R2689 Other abnormalities of gait and mobility: Secondary | ICD-10-CM

## 2023-07-01 NOTE — Therapy (Signed)
 OUTPATIENT PHYSICAL THERAPY NEURO TREATMENT   Patient Name: Sarah [name] MRN: [id number] DOB:[birth year], 78 y.o., female Today's Date: 07/01/2023   PCP: [place], [name], MD REFERRING PROVIDER: [place], [name], MD   END OF SESSION:  PT End of Session - 07/01/23 1549     Visit Number 6    Number of Visits 17    Date for PT Re-Evaluation 07/31/23    Authorization Type [employer] Medicare    PT Start Time 1541    PT Stop Time 1625    PT Time Calculation (min) 44 min    Equipment Utilized During Treatment Gait belt    Activity Tolerance Patient limited by fatigue;Patient tolerated treatment well    Behavior During Therapy [place] for tasks assessed/performed          Past Medical History:  Diagnosis Date   Bilateral carpal tunnel syndrome 03/27/2019   Boils    Glaucoma    Heart murmur    HTN (hypertension)    Hx of adenomatous colonic polyps    Hyperlipidemia    Osteoporosis    Pneumonia    Stroke (HCC)    Type II or unspecified type diabetes mellitus without mention of complication, uncontrolled    Past Surgical History:  Procedure Laterality Date   ABDOMINAL HYSTERECTOMY     CARPAL TUNNEL RELEASE     right   COLONOSCOPY  12-10-10   per Dr. Arvie Latus, clear, repeat in 7 yrs    CORONARY STENT INTERVENTION N/A 03/11/2020   Procedure: CORONARY STENT INTERVENTION;  Surgeon: Lucendia Rusk, MD;  Location: [facility] INVASIVE CV LAB;  Service: Cardiovascular;  Laterality: N/A;   CORONARY ULTRASOUND/IVUS N/A 03/11/2020   Procedure: Intravascular Ultrasound/IVUS;  Surgeon: Lucendia Rusk, MD;  Location: [facility] INVASIVE CV LAB;  Service: Cardiovascular;  Laterality: N/A;   INCISION AND DRAINAGE PERIRECTAL ABSCESS N/A 09/26/2015   Procedure: IRRIGATION AND DEBRIDEMENT PERIRECTAL ABSCESS;  Surgeon: Derral Flick, MD;  Location: [facility] OR;  Service: General;  Laterality: N/A;   KNEE ARTHROSCOPY     right   LEFT HEART CATH AND CORONARY ANGIOGRAPHY N/A 03/11/2020   Procedure: LEFT HEART  CATH AND CORONARY ANGIOGRAPHY;  Surgeon: Lucendia Rusk, MD;  Location: [facility] INVASIVE CV LAB;  Service: Cardiovascular;  Laterality: N/A;   LOOP RECORDER INSERTION N/A 10/19/2016   Procedure: LOOP RECORDER INSERTION;  Surgeon: Jolly Needle, MD;  Location: [facility] INVASIVE CV LAB;  Service: Cardiovascular;  Laterality: N/A;   ORIF ANKLE FRACTURE Right 03/24/2012   Procedure: OPEN REDUCTION INTERNAL FIXATION (ORIF) ANKLE FRACTURE;  Surgeon: Timothy Ford, MD;  Location: [facility] OR;  Service: Orthopedics;  Laterality: Right;  Open Reduction Internal Fixation Right Bimalleolar ankle fracture   POLYPECTOMY     TEE WITHOUT CARDIOVERSION N/A 10/18/2016   Procedure: TRANSESOPHAGEAL ECHOCARDIOGRAM (TEE);  Surgeon: Elmyra Haggard, MD;  Location: [facility] ENDOSCOPY;  Service: Cardiovascular;  Laterality: N/A;   TEE WITHOUT CARDIOVERSION N/A 10/14/2022   Procedure: TRANSESOPHAGEAL ECHOCARDIOGRAM;  Surgeon: Lenise Quince, MD;  Location: [facility] INVASIVE CV LAB;  Service: Cardiovascular;  Laterality: N/A;   TONSILLECTOMY     Patient Active Problem List   Diagnosis Date Noted   B12 deficiency 04/12/2023   Acute ischemic stroke (HCC) 10/13/2022   Generalized weakness 10/12/2022   Stroke (cerebrum) (HCC) 10/12/2022   Stroke-like symptoms 10/11/2022   Urge urinary incontinence 07/02/2022   Seborrheic keratosis 07/02/2022   Pain of right lower extremity 12/11/2021   Nausea and vomiting in adult 12/11/2021  Elevated troponin    Change in mental status 10/24/2021   Long term current use of antithrombotics/antiplatelets 10/02/2021   Heart murmur 04/08/2021   Dizziness 04/08/2021   Long term (current) use of antithrombotics/antiplatelets 12/16/2020   Chronic constipation 12/16/2020   Colon cancer screening 12/16/2020   History of adenomatous polyp of colon 12/16/2020   Coronary artery disease of native artery of native heart with stable angina pectoris (HCC) 03/04/2020   Diabetes mellitus (HCC) 03/03/2020   Aortic  atherosclerosis (HCC) 02/12/2020   Bilateral leg cramps 01/21/2020   Bilateral lower extremity edema 01/21/2020   Anxiety disorder 01/21/2020   Type 2 diabetes mellitus with hyperglycemia, with long-term current use of insulin  (HCC) 08/15/2019   Type 2 diabetes mellitus with diabetic polyneuropathy, with long-term current use of insulin  (HCC) 08/15/2019   Uncontrolled type 2 diabetes mellitus with hypoglycemia without coma (HCC) 08/15/2019   Hyperlipidemia LDL goal <70 08/15/2019   Bilateral carpal tunnel syndrome 03/27/2019   Osteoarthritis of both knees 11/27/2018   CKD (chronic kidney disease), stage II 11/27/2018   GERD (gastroesophageal reflux disease) 06/26/2018   Homonymous hemianopia, left 02/08/2018   Cerebrovascular accident (CVA) due to embolism of cerebral artery (HCC)    Diabetes mellitus with coincident hypertension (HCC)    Trigger finger, right index finger 10/17/2017   Low back pain 07/29/2017   Carotid artery stenosis 11/02/2016   Left arm weakness    Diastolic dysfunction    Hypertension with heart disease    Acute blood loss anemia    Cerebral infarction due to embolism of right middle cerebral artery (HCC)    Perirectal abscess 09/26/2015   Vitamin D  deficiency 07/23/2015   Osteopenia 02/16/2013   Ankle fracture 01/19/2013   Anemia 09/27/2011   Type II diabetes mellitus with nephropathy (HCC) 09/24/2011   Mixed hyperlipidemia 09/24/2011   Primary hypertension 09/24/2011    ONSET DATE: 05/18/2023  REFERRING DIAG: M79.605 (ICD-10-CM) - Pain of left anterior lower extremity M54.50 (ICD-10-CM) - Bilateral low back pain without sciatica, unspecified chronicity I63.9 (ICD-10-CM) - Cerebrovascular accident (CVA), unspecified mechanism (HCC)    THERAPY DIAG:  Muscle weakness (generalized)  Other abnormalities of gait and mobility  Unsteadiness on feet  Difficulty in walking, not elsewhere classified  Other lack of coordination  Abnormal  posture  Rationale for Evaluation and Treatment: Rehabilitation  SUBJECTIVE:                                                                                                                                                                                             SUBJECTIVE STATEMENT: Ambulates in with rollator. She is tired today.  Sugars were 53 this morning and is 105 mg/dL currently.  Pt accompanied by: self  PERTINENT HISTORY: PMH: CAD, HTN, hx of multiple CVAs, DM II, CKD II, anxiety, PAD, and GERD    history of  several small infarcts at left brain watershed areas and left cerebellum in 10/2022 concerning for cardioembolic source, and hx of punctate left cerebellar and left parietal lobe infarcts in 10/2021 likely incidental finding on MRI during encephalopathy work-up of unclear etiology, hx of right parieto-occipital infarcts in setting of progressive right MCA stenosis in 2020 likely secondary to large vessel disease source and right MCA stroke in 2018 s/p ILR without evidence of A-fib during 4 year monitoring duration   Per PCP: Patient complains of anterior left leg pain for over a week, anterior thigh to pretibial area, sometimes she feels like pain is radiating from her back down her entire her leg.  She says the pain is intermittent, and rates it as an 8/10. It occasionally wakes her up at night.   PAIN:  Are you having pain? No   There were no vitals filed for this visit.      PRECAUTIONS: Fall   FALLS: Has patient fallen in last 6 months? No  LIVING ENVIRONMENT: Lives with: lives with mom (who is 27), has aide that comes in everyday and sister lives down the street and sister comes by everyday  Aide is there M-F, sister on weekends  Lives in: House/apartment Stairs: Yes: Internal: 10 steps; railings on both side and then single railing and External: from front door there are 2 steps, back door is the garage and another 2 steps; none Has following equipment at  home: Single point cane, Walker - 2 wheeled, shower chair, and Grab bars Aide makes the meals, sister sets up weekly medications and does chores  PLOF: Independent with household mobility with device, Independent with community mobility with device, and Needs assistance with homemaking No driving   PATIENT GOALS: Wants to walk better and have better balance   OBJECTIVE:  Note: Objective measures were completed at Evaluation unless otherwise noted.  DIAGNOSTIC FINDINGS: MRI brain 10/12/22: IMPRESSION: 1. Small acute infarcts scattered in the left cerebral hemisphere and left cerebellum. 2. Extensive chronic small vessel disease and right cerebral cortex infarcts.  COGNITION: Overall cognitive status: Impaired   SENSATION: Light touch: Impaired , more difficult to detect in RLE   COORDINATION: Slightly dysmetric movements in BLE    POSTURE: rounded shoulders and forward head   LOWER EXTREMITY MMT:    MMT Right Eval Left Eval  Hip flexion 4 4  Hip extension    Hip abduction 4 4  Hip adduction 4 4  Hip internal rotation    Hip external rotation    Knee flexion 4 4  Knee extension 4+ 4+  Ankle dorsiflexion 4 4  Ankle plantarflexion    Ankle inversion    Ankle eversion    (Blank rows = not tested)  All tested in sitting  Decr DF ROM in R ankle due to hx of surgery   BED MOBILITY:  Pt denies difficulty getting in and out of the bed at home   TRANSFERS: Sit to stand: SBA and CGA  Assistive device utilized: None     Stand to sit: SBA  Assistive device utilized: None      Pt needing BUE support to come to stand and more reliant on arms, decr forward lean, one episode of CGA in standing for balance, decr eccentric control  back to chair   GAIT: Findings: Gait Characteristics: step through pattern, decreased stance time- Right, decreased stance time- Left, decreased stride length, Right foot flat, Left foot flat, poor foot clearance- Right, and poor foot clearance-  Left, Unsteadiness with short shuffled steps  Distance walked: Clinic distances ,  Assistive device utilized:Single point cane,  Level of assistance: CGA, and Comments: Lowered pt's cane as it was initially too high Pt intermittently hitting R foot to cane, needing CGA/min A for balance   FUNCTIONAL TESTS:  5 times sit to stand: 21.4 seconds with BUE support  Timed up and go (TUG): 31.5 seconds with SPC and CGA 10 meter walk test: 31.8 seconds = 1.03 ft/sec with SPC and CGA                                                                                                                                TREATMENT DATE: 07/01/23 -Pt requests assistance with ambulating into restroom, pt unable to manage AD and door even with cuing so PT escorts pt into and out of restroom holding door, edu on safety in restroom without assistance and using silver call bell on wall if needing help. -BERG:  [facility] PT Assessment - 07/01/23 1552       Berg Balance Test   Sit to Stand Able to stand  independently using hands    Standing Unsupported Able to stand safely 2 minutes    Sitting with Back Unsupported but Feet Supported on Floor or Stool Able to sit safely and securely 2 minutes    Stand to Sit Uses backs of legs against chair to control descent    Transfers Able to transfer safely, definite need of hands    Standing Unsupported with Eyes Closed Able to stand 10 seconds safely    Standing Unsupported with Feet Together Able to place feet together independently and stand 1 minute safely    From Standing, Reach Forward with Outstretched Arm Can reach forward >12 cm safely (5)    From Standing Position, Pick up Object from Floor Able to pick up shoe safely and easily    From Standing Position, Turn to Look Behind Over each Shoulder Looks behind from both sides and weight shifts well    Turn 360 Degrees Needs close supervision or verbal cueing    Standing Unsupported, Alternately Place Feet on Step/Stool Able  to complete >2 steps/needs minimal assist   CGA x8 steps   Standing Unsupported, One Foot in Front Able to take small step independently and hold 30 seconds    Standing on One Leg Tries to lift leg/unable to hold 3 seconds but remains standing independently    Total Score 40    Berg comment: 40/56 = significant fall risk; full-time walker use        *She requires frequent variable seated recovery periods to complete assessment.  Seated circuit w/ UE focus, each arm singled out, repeated x3: -1lb  bicep curls x8 -1lb shoulder flexion x8 -1lb Bent arm shoulder abduction x8 -1lb punches x10  Postural circuit, performed x2, significant multimodal cues and return demo: -Scapular squeezes x10 -Thoracolumbar extension w/ chairback x5 -Thigh pushups x8-10  PATIENT EDUCATION: Education details: Continue HEP, ongoing rollator brake management particularly with sitting.  BERG interpretation. Person educated: Patient Education method: Explanation Education comprehension: verbalized understanding and needs further education  HOME EXERCISE PROGRAM: *Please print additions in largest format per pt request! Access Code: [email or web address] URL: [email or web address] Date: 06/16/2023 Prepared by: Marilou Showman  Exercises - Sit to Stand with Arms Crossed  - 1 x daily - 4 x weekly - 2 sets - 8 reps - Standing March with Counter Support  - 1 x daily - 4 x weekly - 2 sets - 20 reps - Standing Hip Abduction with Counter Support  - 1 x daily - 4 x weekly - 2 sets - 8 reps - Corner Balance Feet Together With Eyes Closed  - 1 x daily - 4 x weekly - 1 sets - 3 reps - 30 seconds hold  GOALS: Goals reviewed with patient? Yes  SHORT TERM GOALS: Target date: 06/29/2023  Pt will be independent with initial HEP for strength, gait, balance in order to build upon functional gains made in therapy. Baseline: Goal status: INITIAL  2.  Pt will improve 5x sit<>stand to less than or equal to 18 sec to  demonstrate improved functional strength and transfer efficiency.   Baseline: 21.4 seconds with BUE support  21 seconds with BUE support (6/16) Goal status: NOT MET  3.  Pt will improve TUG time to 25 seconds or less with LRAD in order to demo decrease fall risk.  Baseline: 31.5 seconds with SPC and CGA  27.9 seconds with rollator with supervision  Goal status: NOT MET  4.  Pt will improve gait speed with LRAD to at least 1.5 ft/sec in order to demo  decr fall risk  Baseline: 31.8 seconds = 1.03 ft/sec with SPC and CGA   22.1 seconds with with rollator = 1.47 ft/sec with supervision Goal status: MET  5.  Pt will increase BERG balance score to >/=42/56 to demonstrate improved static balance. Baseline: 37/56 (6/2) Goal status: INITIAL   LONG TERM GOALS: Target date: 07/27/2023  Pt will be independent with final HEP for strength, gait, balance in order to build upon functional gains made in therapy. Baseline:  Goal status: INITIAL  2.  Pt will increase BERG balance score to >/=47/56 to demonstrate improved static balance. Baseline: 37/56 (6/2) Goal status: INITIAL  3.  Pt will improve 5x sit<>stand to less than or equal to 15 sec to demonstrate improved functional strength and transfer efficiency.  Baseline: 21.4 seconds with BUE support Goal status: INITIAL  4.  Pt will improve gait speed with LRAD to at least 2.0 ft/sec in order to demo improved community mobility.  Baseline: 31.8 seconds = 1.03 ft/sec with SPC and CGA Goal status: INITIAL  5.  Pt will improve TUG time to 21 seconds or less with LRAD in order to demo decrease fall risk. Baseline: 31.5 seconds with SPC and CGA Goal status: INITIAL    ASSESSMENT:  CLINICAL IMPRESSION: Emphasis of skilled session initially on capturing metrics not assessed last session.  Her BERG score improved 3 points to 40/56 not improving fall risk category at this time.  She continues to need safety education for rollator brake  management.  She was challenged by circuits used with minimal  rest as she needs frequent cues for initiation of tasks.  She was somewhat limited by fatigue today requiring seated recovery frequently, particularly with standing assessment.  Fatigue and cognitive changes are likely to limit progression with PT.  She continues to benefit from skilled PT intervention to improve and optimize upright tolerance and mobility with safest AD option.  Will continue per POC.   OBJECTIVE IMPAIRMENTS: Abnormal gait, decreased activity tolerance, decreased balance, decreased cognition, decreased coordination, decreased endurance, decreased knowledge of use of DME, decreased mobility, difficulty walking, decreased strength, decreased safety awareness, impaired sensation, and postural dysfunction.   ACTIVITY LIMITATIONS: carrying, lifting, bending, standing, squatting, stairs, transfers, and locomotion level  PARTICIPATION LIMITATIONS: meal prep, cleaning, medication management, driving, shopping, and community activity  PERSONAL FACTORS: Age, Behavior pattern, Past/current experiences, Time since onset of injury/illness/exacerbation, and 3+ comorbidities: CAD, HTN, hx of multiple CVAs, DM II, CKD II, anxiety, PAD, and GERD are also affecting patient's functional outcome.   REHAB POTENTIAL: Good  CLINICAL DECISION MAKING: Evolving/moderate complexity  EVALUATION COMPLEXITY: Moderate  PLAN:  PT FREQUENCY: 2x/week  PT DURATION: 8 weeks  PLANNED INTERVENTIONS: 97164- PT Re-evaluation, 97110-Therapeutic exercises, 97530- Therapeutic activity, 97112- Neuromuscular re-education, 97535- Self Care, 16109- Manual therapy, [phone]- Gait training, Patient/Family education, Balance training, Stair training, and DME instructions  PLAN FOR NEXT SESSION: How is HEP for strength, balance - expand as needed. Work on strength/balance/endurance in clinic, stair training; she may benefit from circuit style activity and maybe arm  bike vs scifit; check glucose if able to.    Earlean Glaze, PT, DPT 07/01/2023, 4:31 PM

## 2023-07-04 ENCOUNTER — Ambulatory Visit: Admitting: Physical Therapy

## 2023-07-04 ENCOUNTER — Encounter: Payer: Self-pay | Admitting: Physical Therapy

## 2023-07-04 DIAGNOSIS — R262 Difficulty in walking, not elsewhere classified: Secondary | ICD-10-CM

## 2023-07-04 DIAGNOSIS — M6281 Muscle weakness (generalized): Secondary | ICD-10-CM | POA: Diagnosis not present

## 2023-07-04 DIAGNOSIS — R2689 Other abnormalities of gait and mobility: Secondary | ICD-10-CM

## 2023-07-04 DIAGNOSIS — R2681 Unsteadiness on feet: Secondary | ICD-10-CM

## 2023-07-04 NOTE — Therapy (Signed)
 OUTPATIENT PHYSICAL THERAPY NEURO TREATMENT   Patient Name: Sarah [name] MRN: [id number] DOB:[birth year], 78 y.o., female Today's Date: 07/04/2023   PCP: [place], [name], MD REFERRING PROVIDER: [place], [name], MD   END OF SESSION:  PT End of Session - 07/04/23 1451     Visit Number 7    Number of Visits 17    Date for PT Re-Evaluation 07/31/23    Authorization Type [employer] Medicare    PT Start Time 1449    PT Stop Time 1529    PT Time Calculation (min) 40 min    Equipment Utilized During Treatment Gait belt    Activity Tolerance Patient limited by fatigue;Patient tolerated treatment well    Behavior During Therapy [facility] for tasks assessed/performed          Past Medical History:  Diagnosis Date   Bilateral carpal tunnel syndrome 03/27/2019   Boils    Glaucoma    Heart murmur    HTN (hypertension)    Hx of adenomatous colonic polyps    Hyperlipidemia    Osteoporosis    Pneumonia    Stroke (HCC)    Type II or unspecified type diabetes mellitus without mention of complication, uncontrolled    Past Surgical History:  Procedure Laterality Date   ABDOMINAL HYSTERECTOMY     CARPAL TUNNEL RELEASE     right   COLONOSCOPY  12-10-10   per Dr. Debrah, clear, repeat in 7 yrs    CORONARY STENT INTERVENTION N/A 03/11/2020   Procedure: CORONARY STENT INTERVENTION;  Surgeon: Dann Candyce RAMAN, MD;  Location: [facility] INVASIVE CV LAB;  Service: Cardiovascular;  Laterality: N/A;   CORONARY ULTRASOUND/IVUS N/A 03/11/2020   Procedure: Intravascular Ultrasound/IVUS;  Surgeon: Dann Candyce RAMAN, MD;  Location: [facility] INVASIVE CV LAB;  Service: Cardiovascular;  Laterality: N/A;   INCISION AND DRAINAGE PERIRECTAL ABSCESS N/A 09/26/2015   Procedure: IRRIGATION AND DEBRIDEMENT PERIRECTAL ABSCESS;  Surgeon: Herlene Beverley Bureau, MD;  Location: [facility] OR;  Service: General;  Laterality: N/A;   KNEE ARTHROSCOPY     right   LEFT HEART CATH AND CORONARY ANGIOGRAPHY N/A 03/11/2020   Procedure: LEFT HEART  CATH AND CORONARY ANGIOGRAPHY;  Surgeon: Dann Candyce RAMAN, MD;  Location: [facility] INVASIVE CV LAB;  Service: Cardiovascular;  Laterality: N/A;   LOOP RECORDER INSERTION N/A 10/19/2016   Procedure: LOOP RECORDER INSERTION;  Surgeon: Kelsie Agent, MD;  Location: [facility] INVASIVE CV LAB;  Service: Cardiovascular;  Laterality: N/A;   ORIF ANKLE FRACTURE Right 03/24/2012   Procedure: OPEN REDUCTION INTERNAL FIXATION (ORIF) ANKLE FRACTURE;  Surgeon: Jerona LULLA Sage, MD;  Location: [facility] OR;  Service: Orthopedics;  Laterality: Right;  Open Reduction Internal Fixation Right Bimalleolar ankle fracture   POLYPECTOMY     TEE WITHOUT CARDIOVERSION N/A 10/18/2016   Procedure: TRANSESOPHAGEAL ECHOCARDIOGRAM (TEE);  Surgeon: Okey Vina LULLA, MD;  Location: [facility] ENDOSCOPY;  Service: Cardiovascular;  Laterality: N/A;   TEE WITHOUT CARDIOVERSION N/A 10/14/2022   Procedure: TRANSESOPHAGEAL ECHOCARDIOGRAM;  Surgeon: Pietro Redell RAMAN, MD;  Location: [facility] INVASIVE CV LAB;  Service: Cardiovascular;  Laterality: N/A;   TONSILLECTOMY     Patient Active Problem List   Diagnosis Date Noted   B12 deficiency 04/12/2023   Acute ischemic stroke (HCC) 10/13/2022   Generalized weakness 10/12/2022   Stroke (cerebrum) (HCC) 10/12/2022   Stroke-like symptoms 10/11/2022   Urge urinary incontinence 07/02/2022   Seborrheic keratosis 07/02/2022   Pain of right lower extremity 12/11/2021   Nausea and vomiting in adult 12/11/2021  Elevated troponin    Change in mental status 10/24/2021   Long term current use of antithrombotics/antiplatelets 10/02/2021   Heart murmur 04/08/2021   Dizziness 04/08/2021   Long term (current) use of antithrombotics/antiplatelets 12/16/2020   Chronic constipation 12/16/2020   Colon cancer screening 12/16/2020   History of adenomatous polyp of colon 12/16/2020   Coronary artery disease of native artery of native heart with stable angina pectoris (HCC) 03/04/2020   Diabetes mellitus (HCC) 03/03/2020   Aortic  atherosclerosis (HCC) 02/12/2020   Bilateral leg cramps 01/21/2020   Bilateral lower extremity edema 01/21/2020   Anxiety disorder 01/21/2020   Type 2 diabetes mellitus with hyperglycemia, with long-term current use of insulin  (HCC) 08/15/2019   Type 2 diabetes mellitus with diabetic polyneuropathy, with long-term current use of insulin  (HCC) 08/15/2019   Uncontrolled type 2 diabetes mellitus with hypoglycemia without coma (HCC) 08/15/2019   Hyperlipidemia LDL goal <70 08/15/2019   Bilateral carpal tunnel syndrome 03/27/2019   Osteoarthritis of both knees 11/27/2018   CKD (chronic kidney disease), stage II 11/27/2018   GERD (gastroesophageal reflux disease) 06/26/2018   Homonymous hemianopia, left 02/08/2018   Cerebrovascular accident (CVA) due to embolism of cerebral artery (HCC)    Diabetes mellitus with coincident hypertension (HCC)    Trigger finger, right index finger 10/17/2017   Low back pain 07/29/2017   Carotid artery stenosis 11/02/2016   Left arm weakness    Diastolic dysfunction    Hypertension with heart disease    Acute blood loss anemia    Cerebral infarction due to embolism of right middle cerebral artery (HCC)    Perirectal abscess 09/26/2015   Vitamin D  deficiency 07/23/2015   Osteopenia 02/16/2013   Ankle fracture 01/19/2013   Anemia 09/27/2011   Type II diabetes mellitus with nephropathy (HCC) 09/24/2011   Mixed hyperlipidemia 09/24/2011   Primary hypertension 09/24/2011    ONSET DATE: 05/18/2023  REFERRING DIAG: M79.605 (ICD-10-CM) - Pain of left anterior lower extremity M54.50 (ICD-10-CM) - Bilateral low back pain without sciatica, unspecified chronicity I63.9 (ICD-10-CM) - Cerebrovascular accident (CVA), unspecified mechanism (HCC)    THERAPY DIAG:  Muscle weakness (generalized)  Other abnormalities of gait and mobility  Unsteadiness on feet  Difficulty in walking, not elsewhere classified  Rationale for Evaluation and Treatment:  Rehabilitation  SUBJECTIVE:                                                                                                                                                                                             SUBJECTIVE STATEMENT: Nothing new, no falls. Blood glucose right now is 148 mg/dL. Was tired after last session  and did not work on anything over the weekend.   Pt accompanied by: self  PERTINENT HISTORY: PMH: CAD, HTN, hx of multiple CVAs, DM II, CKD II, anxiety, PAD, and GERD    history of  several small infarcts at left brain watershed areas and left cerebellum in 10/2022 concerning for cardioembolic source, and hx of punctate left cerebellar and left parietal lobe infarcts in 10/2021 likely incidental finding on MRI during encephalopathy work-up of unclear etiology, hx of right parieto-occipital infarcts in setting of progressive right MCA stenosis in 2020 likely secondary to large vessel disease source and right MCA stroke in 2018 s/p ILR without evidence of A-fib during 4 year monitoring duration   Per PCP: Patient complains of anterior left leg pain for over a week, anterior thigh to pretibial area, sometimes she feels like pain is radiating from her back down her entire her leg.  She says the pain is intermittent, and rates it as an 8/10. It occasionally wakes her up at night.   PAIN:  Are you having pain? No   There were no vitals filed for this visit.      PRECAUTIONS: Fall   FALLS: Has patient fallen in last 6 months? No  LIVING ENVIRONMENT: Lives with: lives with mom (who is 41), has aide that comes in everyday and sister lives down the street and sister comes by everyday  Aide is there M-F, sister on weekends  Lives in: House/apartment Stairs: Yes: Internal: 10 steps; railings on both side and then single railing and External: from front door there are 2 steps, back door is the garage and another 2 steps; none Has following equipment at home: Single point  cane, Walker - 2 wheeled, shower chair, and Grab bars Aide makes the meals, sister sets up weekly medications and does chores  PLOF: Independent with household mobility with device, Independent with community mobility with device, and Needs assistance with homemaking No driving   PATIENT GOALS: Wants to walk better and have better balance   OBJECTIVE:  Note: Objective measures were completed at Evaluation unless otherwise noted.  DIAGNOSTIC FINDINGS: MRI brain 10/12/22: IMPRESSION: 1. Small acute infarcts scattered in the left cerebral hemisphere and left cerebellum. 2. Extensive chronic small vessel disease and right cerebral cortex infarcts.  COGNITION: Overall cognitive status: Impaired   SENSATION: Light touch: Impaired , more difficult to detect in RLE   COORDINATION: Slightly dysmetric movements in BLE    POSTURE: rounded shoulders and forward head   LOWER EXTREMITY MMT:    MMT Right Eval Left Eval  Hip flexion 4 4  Hip extension    Hip abduction 4 4  Hip adduction 4 4  Hip internal rotation    Hip external rotation    Knee flexion 4 4  Knee extension 4+ 4+  Ankle dorsiflexion 4 4  Ankle plantarflexion    Ankle inversion    Ankle eversion    (Blank rows = not tested)  All tested in sitting  Decr DF ROM in R ankle due to hx of surgery   BED MOBILITY:  Pt denies difficulty getting in and out of the bed at home   TRANSFERS: Sit to stand: SBA and CGA  Assistive device utilized: None     Stand to sit: SBA  Assistive device utilized: None      Pt needing BUE support to come to stand and more reliant on arms, decr forward lean, one episode of CGA in standing for balance, decr eccentric control  back to chair   GAIT: Findings: Gait Characteristics: step through pattern, decreased stance time- Right, decreased stance time- Left, decreased stride length, Right foot flat, Left foot flat, poor foot clearance- Right, and poor foot clearance- Left, Unsteadiness  with short shuffled steps  Distance walked: Clinic distances ,  Assistive device utilized:Single point cane,  Level of assistance: CGA, and Comments: Lowered pt's cane as it was initially too high Pt intermittently hitting R foot to cane, needing CGA/min A for balance   FUNCTIONAL TESTS:  5 times sit to stand: 21.4 seconds with BUE support  Timed up and go (TUG): 31.5 seconds with SPC and CGA 10 meter walk test: 31.8 seconds = 1.03 ft/sec with SPC and CGA                                                                                                                                TREATMENT DATE: 07/04/23   Therapeutic Activity:  NuStep at gear 3.0 > 2.0 (dropped resistance at about 6 minutes) for 8 minutes with BLE/BUE for strengthening, ROM, activity tolerance, pt reporting RPE as 5-6/10  Standing and holding 4# medicine ball, and tossing it to floor and catching it 15 reps total, with first 10 reps pt unable to catch ball due to unable to throw it with enough force, pt able to catch remaining last 5 reps, cues for incr intensity when throwing, CGA as needed for balance, pt reporting RPE as 8/10  Sit <> stands while holding 4# medicine ball, with no UE support, 10 reps, initial cues for incr forward lean to prevent BLE bracing, performed an additional 15 reps holding 2kg yellow ball and perform sit > stand and immediately tossing it forwards and then sitting back down. PT student retrieving ball. Intermittent cues for incr forward lean. Pt reporting RPE as 7/10   On air ex: alternating SLS taps to 6 step, performed 4 bouts of 30 seconds with 15 second rest break in between each, cued for slowed pace, pt needing CGA/min A for balance due to pt with tendency to lose balance posteriorly    PATIENT EDUCATION: Education details: Continue HEP, ongoing rollator brake management particularly with sitting.   Person educated: Patient Education method: Explanation Education comprehension:  verbalized understanding and needs further education  HOME EXERCISE PROGRAM: *Please print additions in largest format per pt request! Access Code: [email or web address] URL: [email or web address] Date: 06/16/2023 Prepared by: Daved Bull  Exercises - Sit to Stand with Arms Crossed  - 1 x daily - 4 x weekly - 2 sets - 8 reps - Standing March with Counter Support  - 1 x daily - 4 x weekly - 2 sets - 20 reps - Standing Hip Abduction with Counter Support  - 1 x daily - 4 x weekly - 2 sets - 8 reps - Corner Balance Feet Together With Eyes Closed  - 1 x daily - 4 x weekly - 1 sets - 3  reps - 30 seconds hold  GOALS: Goals reviewed with patient? Yes  SHORT TERM GOALS: Target date: 06/29/2023  Pt will be independent with initial HEP for strength, gait, balance in order to build upon functional gains made in therapy. Baseline: Goal status: PARTIALLY MET  2.  Pt will improve 5x sit<>stand to less than or equal to 18 sec to demonstrate improved functional strength and transfer efficiency.   Baseline: 21.4 seconds with BUE support  21 seconds with BUE support (6/16) Goal status: NOT MET  3.  Pt will improve TUG time to 25 seconds or less with LRAD in order to demo decrease fall risk.  Baseline: 31.5 seconds with SPC and CGA  27.9 seconds with rollator with supervision  Goal status: NOT MET  4.  Pt will improve gait speed with LRAD to at least 1.5 ft/sec in order to demo  decr fall risk  Baseline: 31.8 seconds = 1.03 ft/sec with SPC and CGA   22.1 seconds with with rollator = 1.47 ft/sec with supervision Goal status: MET  5.  Pt will increase BERG balance score to >/=42/56 to demonstrate improved static balance. Baseline: 37/56 (6/2)  40/56 Goal status: NOT MET   LONG TERM GOALS: Target date: 07/27/2023  Pt will be independent with final HEP for strength, gait, balance in order to build upon functional gains made in therapy. Baseline:  Goal status: INITIAL  2.  Pt  will increase BERG balance score to >/=47/56 to demonstrate improved static balance. Baseline: 37/56 (6/2) Goal status: INITIAL  3.  Pt will improve 5x sit<>stand to less than or equal to 15 sec to demonstrate improved functional strength and transfer efficiency.  Baseline: 21.4 seconds with BUE support Goal status: INITIAL  4.  Pt will improve gait speed with LRAD to at least 2.0 ft/sec in order to demo improved community mobility.  Baseline: 31.8 seconds = 1.03 ft/sec with SPC and CGA Goal status: INITIAL  5.  Pt will improve TUG time to 21 seconds or less with LRAD in order to demo decrease fall risk. Baseline: 31.5 seconds with SPC and CGA Goal status: INITIAL    ASSESSMENT:  CLINICAL IMPRESSION: Today's skilled session focused on BLE strength and balance. Focus on sit <> stands with no UE support and incr forward lean to prevent BLE bracing. Pt needing CGA/min A with SLS taps with pt standing on an unlevel surface, pt with tendency to lose balance posteriorly.  Pt able to perform session with less rest breaks today compared to previous sessions. Will continue per POC.    OBJECTIVE IMPAIRMENTS: Abnormal gait, decreased activity tolerance, decreased balance, decreased cognition, decreased coordination, decreased endurance, decreased knowledge of use of DME, decreased mobility, difficulty walking, decreased strength, decreased safety awareness, impaired sensation, and postural dysfunction.   ACTIVITY LIMITATIONS: carrying, lifting, bending, standing, squatting, stairs, transfers, and locomotion level  PARTICIPATION LIMITATIONS: meal prep, cleaning, medication management, driving, shopping, and community activity  PERSONAL FACTORS: Age, Behavior pattern, Past/current experiences, Time since onset of injury/illness/exacerbation, and 3+ comorbidities: CAD, HTN, hx of multiple CVAs, DM II, CKD II, anxiety, PAD, and GERD are also affecting patient's functional outcome.   REHAB POTENTIAL:  Good  CLINICAL DECISION MAKING: Evolving/moderate complexity  EVALUATION COMPLEXITY: Moderate  PLAN:  PT FREQUENCY: 2x/week  PT DURATION: 8 weeks  PLANNED INTERVENTIONS: 97164- PT Re-evaluation, 97110-Therapeutic exercises, 97530- Therapeutic activity, [id number]- Neuromuscular re-education, 97535- Self Care, [id number]- Manual therapy, [phone]- Gait training, Patient/Family education, Balance training, Stair training, and DME instructions  PLAN  FOR NEXT SESSION: How is HEP for strength, balance - expand as needed. Work on strength/balance/endurance in clinic, stair training; she may benefit from circuit style activity and maybe arm bike vs scifit; check glucose if able to.    [place] LOISE Senate, PT, DPT 07/04/2023, 3:30 PM

## 2023-07-06 ENCOUNTER — Ambulatory Visit: Admitting: Family Medicine

## 2023-07-06 ENCOUNTER — Telehealth (INDEPENDENT_AMBULATORY_CARE_PROVIDER_SITE_OTHER): Admitting: Family Medicine

## 2023-07-06 ENCOUNTER — Encounter: Payer: Self-pay | Admitting: Family Medicine

## 2023-07-06 VITALS — BP 145/75 | HR 80 | Ht 60.0 in | Wt 93.0 lb

## 2023-07-06 DIAGNOSIS — N182 Chronic kidney disease, stage 2 (mild): Secondary | ICD-10-CM

## 2023-07-06 DIAGNOSIS — K5909 Other constipation: Secondary | ICD-10-CM | POA: Diagnosis not present

## 2023-07-06 DIAGNOSIS — E876 Hypokalemia: Secondary | ICD-10-CM

## 2023-07-06 DIAGNOSIS — I10 Essential (primary) hypertension: Secondary | ICD-10-CM | POA: Diagnosis not present

## 2023-07-06 NOTE — Assessment & Plan Note (Signed)
 Probably has improved. Continue MiraLAX  and Dulcolax daily at night. Stressed the importance of adequate hydration and fiber intake, recommend OTC Benefiber 1 teaspoon twice daily. Last colonoscopy in 02/2021.

## 2023-07-06 NOTE — Progress Notes (Signed)
 Virtual Visit via Video Note I connected with Sarah [name] on 07/06/2023 at 10:30 AM by telephone and verified that I am speaking with the correct person using two identifiers. Location patient: home Location provider: work office Persons participating in the virtual visit: patient, provider, and medical scribe.  I discussed the limitations of evaluation and management by telemedicine and the availability of in person appointments. The patient expressed understanding and agreed to proceed.  Chief Complaint  Patient presents with   Medical Management of Chronic Issues   HPI: Sarah [name] is a 78 y.o. female with a PMHx significant for CAD, HTN, CVA, DM II, CKD II, anxiety, PAD, and GERD, who is here today for 4-week Follow-up. She was last seen in the office on 06/10/23, when she had a few concerns.  Weight Loss Mentioned on 5/30 when she weighed 97 lbs, today says that she weighed herself at 93 lbs, and last week when she weighed herself was 94/95 lbs.  Says that since 5/30, she has increased her protein by eating more yogurt, eggs now daily, more chicken, and is exercising more regularly, going to PT, and she does feel like her strength is improving.  Endorses improvement in her appetite.  States that wt has been stable.  Nausea//Vomiting//Constipation//Abdominal Pain: Since mentioned on 5/30, she says that these have all improved - noticed tomato sauce is a contributing factor to nausea, did have 1 episode of vomiting last week, but otherwise since having regular bowel movements and her abdominal pain has resolved.  Has started taking Dulcolax and Miralax  daily, which has apparently helped as she has had bowel movements a couple times/ week, which is better than her usual.   Diabetes Mellitus, type II Managed with Trulicity , mentions that med was decreased from 1.5 mg to 0.75 mg weekly when she saw Dr. Sam, Endocrinologist, on 6/18.  Also managed with: Toujeo  20  units daily, Humalog  8 units w/ each meal, Farxiga  10 mg daily.  Lab Results  Component Value Date   GLUCOSE 153 (H) 06/10/2023   HGBA1C 6.8 (A) 06/29/2023   Hypertension: BP mildly elevated today. On  Amlodipine  5 mg daily and Losartan  50 mg daily.  BP today reportedly 145/70s, though she notes she hasn't yet taken her medication.  Reports lower readings when checked by PT every other day. Negative for headache, CP, dyspnea, or worsening edema.  Chronic Kidney Disease, stage II: Followed by [facility], who she last saw in November 2024, and will see next Nephrology in July.  Lab Results  Component Value Date   NA 142 06/10/2023   CL 102 06/10/2023   K 3.3 (L) 06/10/2023   CO2 27 06/10/2023   BUN 24 (H) 06/10/2023   CREATININE 1.21 (H) 06/10/2023   GFR 43.73 (L) 06/10/2023   CALCIUM  10.1 06/10/2023   PHOS 3.5 10/14/2022   ALBUMIN 3.3 (L) 03/17/2023   GLUCOSE 153 (H) 06/10/2023   Hypokalemia: When checked 5/30, Potassium was 3.3, so she was started on Klor-Con  20 meq daily x5 days.    ROS: See pertinent positives and negatives per HPI.  Past Medical History:  Diagnosis Date   Bilateral carpal tunnel syndrome 03/27/2019   Boils    Glaucoma    Heart murmur    HTN (hypertension)    Hx of adenomatous colonic polyps    Hyperlipidemia    Osteoporosis    Pneumonia    Stroke (HCC)    Type II or unspecified type diabetes mellitus without mention  of complication, uncontrolled     Past Surgical History:  Procedure Laterality Date   ABDOMINAL HYSTERECTOMY     CARPAL TUNNEL RELEASE     right   COLONOSCOPY  12-10-10   per Dr. Debrah, clear, repeat in 7 yrs    CORONARY STENT INTERVENTION N/A 03/11/2020   Procedure: CORONARY STENT INTERVENTION;  Surgeon: Dann Candyce RAMAN, MD;  Location: [facility] INVASIVE CV LAB;  Service: Cardiovascular;  Laterality: N/A;   CORONARY ULTRASOUND/IVUS N/A 03/11/2020   Procedure: Intravascular Ultrasound/IVUS;  Surgeon: Dann Candyce RAMAN, MD;  Location: [facility] INVASIVE CV LAB;  Service: Cardiovascular;  Laterality: N/A;   INCISION AND DRAINAGE PERIRECTAL ABSCESS N/A 09/26/2015   Procedure: IRRIGATION AND DEBRIDEMENT PERIRECTAL ABSCESS;  Surgeon: Herlene Beverley Bureau, MD;  Location: [facility];  Service: General;  Laterality: N/A;   KNEE ARTHROSCOPY     right   LEFT HEART CATH AND CORONARY ANGIOGRAPHY N/A 03/11/2020   Procedure: LEFT HEART CATH AND CORONARY ANGIOGRAPHY;  Surgeon: Dann Candyce RAMAN, MD;  Location: [facility] INVASIVE CV LAB;  Service: Cardiovascular;  Laterality: N/A;   LOOP RECORDER INSERTION N/A 10/19/2016   Procedure: LOOP RECORDER INSERTION;  Surgeon: Kelsie Agent, MD;  Location: [facility] INVASIVE CV LAB;  Service: Cardiovascular;  Laterality: N/A;   ORIF ANKLE FRACTURE Right 03/24/2012   Procedure: OPEN REDUCTION INTERNAL FIXATION (ORIF) ANKLE FRACTURE;  Surgeon: Jerona LULLA Sage, MD;  Location: [facility] OR;  Service: Orthopedics;  Laterality: Right;  Open Reduction Internal Fixation Right Bimalleolar ankle fracture   POLYPECTOMY     TEE WITHOUT CARDIOVERSION N/A 10/18/2016   Procedure: TRANSESOPHAGEAL ECHOCARDIOGRAM (TEE);  Surgeon: Okey Vina LULLA, MD;  Location: [facility] ENDOSCOPY;  Service: Cardiovascular;  Laterality: N/A;   TEE WITHOUT CARDIOVERSION N/A 10/14/2022   Procedure: TRANSESOPHAGEAL ECHOCARDIOGRAM;  Surgeon: Pietro Redell RAMAN, MD;  Location: [facility] INVASIVE CV LAB;  Service: Cardiovascular;  Laterality: N/A;   TONSILLECTOMY      Family History  Problem Relation Age of Onset   Diabetes Mother    Hypertension Mother    Stroke Father    Stroke Maternal Uncle    Stroke Paternal Uncle    Colon cancer Neg Hx    Esophageal cancer Neg Hx    Rectal cancer Neg Hx    Stomach cancer Neg Hx    Inflammatory bowel disease Neg Hx    Liver disease Neg Hx    Pancreatic cancer Neg Hx     Social History   Socioeconomic History   Marital status: Single    Spouse name: Not on file   Number of children: Not on file   Years of  education: Not on file   Highest education level: Associate degree: occupational, Scientist, product/process development, or vocational program  Occupational History   Not on file  Tobacco Use   Smoking status: Former    Current packs/day: 0.00    Types: Cigarettes    Quit date: 10/15/2016    Years since quitting: 6.7   Smokeless tobacco: Never   Tobacco comments:    smokes occ.   Vaping Use   Vaping status: Never Used  Substance and Sexual Activity   Alcohol  use: Not Currently    Comment: occ   Drug use: No   Sexual activity: Not on file  Other Topics Concern   Not on file  Social History Narrative   Caffiene coffee 1 cup in am.   Retired from [employer].     Social Drivers of Corporate investment banker Strain: Low Risk  (  05/16/2023)   Overall Financial Resource Strain (CARDIA)    Difficulty of Paying Living Expenses: Not hard at all  Food Insecurity: No Food Insecurity (05/16/2023)   Hunger Vital Sign    Worried About Running Out of Food in the Last Year: Never true    Ran Out of Food in the Last Year: Never true  Transportation Needs: No Transportation Needs (05/16/2023)   PRAPARE - Administrator, Civil Service (Medical): No    Lack of Transportation (Non-Medical): No  Physical Activity: Insufficiently Active (05/16/2023)   Exercise Vital Sign    Days of Exercise per Week: 2 days    Minutes of Exercise per Session: 60 min  Stress: No Stress Concern Present (05/16/2023)   [employer] of Occupational Health - Occupational Stress Questionnaire    Feeling of Stress : Not at all  Recent Concern: Stress - Stress Concern Present (05/09/2023)   [employer] of Occupational Health - Occupational Stress Questionnaire    Feeling of Stress : To some extent  Social Connections: Socially Isolated (05/16/2023)   Social Connection and Isolation Panel    Frequency of Communication with Friends and Family: More than three times a week    Frequency of Social Gatherings with Friends and  Family: More than three times a week    Attends Religious Services: Never    Database administrator or Organizations: No    Attends Banker Meetings: Never    Marital Status: Widowed  Intimate Partner Violence: Not At Risk (05/16/2023)   Humiliation, Afraid, Rape, and Kick questionnaire    Fear of Current or Ex-Partner: No    Emotionally Abused: No    Physically Abused: No    Sexually Abused: No     Current Outpatient Medications:    amLODipine  (NORVASC ) 5 MG tablet, Take 1 tablet (5 mg total) by mouth daily., Disp: 90 tablet, Rfl: 3   aspirin  EC 81 MG tablet, Take 1 tablet (81 mg total) by mouth daily. Swallow whole., Disp: 30 tablet, Rfl: 12   cholecalciferol (VITAMIN D3) 25 MCG (1000 UNIT) tablet, Take 1,000 Units by mouth daily., Disp: , Rfl:    clopidogrel  (PLAVIX ) 75 MG tablet, TAKE 1 TABLET BY MOUTH DAILY, Disp: 90 tablet, Rfl: 1   Continuous Glucose Receiver (DEXCOM G7 RECEIVER) DEVI, USE TO MONITOR GLUCOSE, Disp: 1 each, Rfl: 0   Continuous Glucose Sensor (DEXCOM G7 SENSOR) MISC, USE AS DIRECTED, Disp: 9 each, Rfl: 3   dapagliflozin  propanediol (FARXIGA ) 10 MG TABS tablet, TAKE 1 TABLET BY MOUTH DAILY, Disp: 90 tablet, Rfl: 1   Dulaglutide  (TRULICITY ) 0.75 MG/0.5ML SOAJ, Inject 0.75 mg into the skin once a week., Disp: 6 mL, Rfl: 3   ezetimibe  (ZETIA ) 10 MG tablet, Take 1 tablet (10 mg total) by mouth daily., Disp: 90 tablet, Rfl: 3   furosemide  (LASIX ) 20 MG tablet, Take 1 tablet by mouth daily (only as needed), Disp: 90 tablet, Rfl: 3   insulin  glargine, 1 Unit Dial , (TOUJEO  SOLOSTAR) 300 UNIT/ML Solostar Pen, Inject 14 Units into the skin daily in the afternoon., Disp: 15 mL, Rfl: 3   insulin  lispro (HUMALOG  KWIKPEN) 100 UNIT/ML KwikPen, Max daily 30 units (Patient taking differently: Inject 6 Units into the skin 3 (three) times daily. Max daily 30 units), Disp: 30 mL, Rfl: 3   latanoprost  (XALATAN ) 0.005 % ophthalmic solution, Place 1 drop into both eyes at  bedtime., Disp: , Rfl:    losartan  (COZAAR ) 50 MG tablet, TAKE 1  TABLET BY MOUTH DAILY, Disp: 90 tablet, Rfl: 3   Magnesium 250 MG TABS, Take 250 mg by mouth daily., Disp: , Rfl:    nitroGLYCERIN  (NITROSTAT ) 0.4 MG SL tablet, Place 1 tablet (0.4 mg total) under the tongue every 5 (five) minutes as needed for chest pain. one tab every 5 minutes up to 3 tablets total over 15 minutes., Disp: 25 tablet, Rfl: 3   ondansetron  (ZOFRAN ) 4 MG tablet, Take 1 tablet (4 mg total) by mouth 2 (two) times daily as needed for nausea or vomiting., Disp: 30 tablet, Rfl: 1   potassium chloride  SA (KLOR-CON  M) 20 MEQ tablet, Take 1 tablet (20 mEq total) by mouth daily for 5 days., Disp: 5 tablet, Rfl: 0   rosuvastatin  (CRESTOR ) 20 MG tablet, TAKE 1 TABLET BY MOUTH DAILY, Disp: 90 tablet, Rfl: 3   sertraline  (ZOLOFT ) 50 MG tablet, TAKE 1 TABLET BY MOUTH DAILY, Disp: 90 tablet, Rfl: 3   topiramate  (TOPAMAX ) 50 MG tablet, Take 1 tablet (50 mg total) by mouth at bedtime., Disp: 30 tablet, Rfl: 5  EXAM:  VITALS per patient if applicable: BP (!) 145/75   Pulse 80   Ht 5' (1.524 m)   Wt 93 lb (42.2 kg)   BMI 18.16 kg/m   GENERAL: alert, oriented, sounds well and in no acute distress  LUNGS: no signs heard of respiratory distress, breathing rate appears normal, no obvious gross SOB, gasping or wheezing  PSYCH/NEURO: pleasant and cooperative, no obvious depression or anxiety, speech and thought processing grossly intact  ASSESSMENT AND PLAN:  Discussed the following assessment and plan:  In general all the problem she reported last visit have improved. Wt reported as stable. Recommend continuing adequate protein intake. Still reporting mild nausea and one episode of vomiting since her last visit, these are chronic and reported as improved. She has Zofran  to take as needed.  Chronic constipation Assessment & Plan: Probably has improved. Continue MiraLAX  and Dulcolax daily at night. Stressed the importance of  adequate hydration and fiber intake, recommend OTC Benefiber 1 teaspoon twice daily. Last colonoscopy in 02/2021.  Hypokalemia Complete K-Lor 20 mK treatment. Continue potassium rich diet. She has an appointment scheduled with her nephrologist in 07/2023.  Primary hypertension Assessment & Plan: BP mildly elevated today, states that he she has not taken her medication today.  BPs in the past 120s/130s/70-80's, so no changes today. Continue amlodipine  5 mg daily and losartan  50 mg daily as well as low salt/DASH diet. Follow-up in 6 months.  CKD (chronic kidney disease), stage II Assessment & Plan: Continue adequate hydration, low-salt diet, and avoidance of NSAIDs. Following with nephrology regularly, last visit 11/2022. She has an appt already scheduled.  I spent a total of 15 minutes in non face to face encounter and 10 min completing documentation.  We discussed possible serious and likely etiologies, options for evaluation and workup, limitations of telemedicine visit vs in person visit, treatment, treatment risks and precautions. The patient was advised to call back or seek an in-person evaluation if the symptoms worsen or if the condition fails to improve as anticipated. I discussed the assessment and treatment plan with the patient. The patient was provided an opportunity to ask questions and all were answered. The patient agreed with the plan and demonstrated an understanding of the instructions.  Return in about 6 months (around 01/05/2024) for chronic problems.  I,Emily Lagle,acting as a Neurosurgeon for Raihana Balderrama [place], MD.,have documented all relevant documentation on the behalf of Tremar Wickens [place],  MD,as directed by  Kennard Fildes [place], MD while in the presence of Shamon Cothran [place], MD.  I, Kaelem Brach [place], MD, have reviewed all documentation for this visit. The do cumentation on 07/06/23 for the exam, diagnosis, procedures, and orders are all accurate and complete. Trase Bunda [place], MD

## 2023-07-06 NOTE — Assessment & Plan Note (Signed)
 Continue adequate hydration, low-salt diet, and avoidance of NSAIDs. Following with nephrology regularly, last visit 11/2022. She has an appt already scheduled.

## 2023-07-06 NOTE — Assessment & Plan Note (Signed)
 BP mildly elevated today, states that he she has not taken her medication today.  BPs in the past 120s/130s/70-80's, so no changes today. Continue amlodipine  5 mg daily and losartan  50 mg daily as well as low salt/DASH diet. Follow-up in 6 months.

## 2023-07-07 ENCOUNTER — Ambulatory Visit

## 2023-07-07 DIAGNOSIS — I6340 Cerebral infarction due to embolism of unspecified cerebral artery: Secondary | ICD-10-CM | POA: Diagnosis not present

## 2023-07-07 LAB — CUP PACEART REMOTE DEVICE CHECK
Date Time Interrogation Session: 20250625234247
Implantable Pulse Generator Implant Date: 20240618

## 2023-07-08 ENCOUNTER — Ambulatory Visit: Admitting: Physical Therapy

## 2023-07-08 LAB — HM DIABETES EYE EXAM

## 2023-07-09 ENCOUNTER — Other Ambulatory Visit: Payer: Self-pay | Admitting: Family Medicine

## 2023-07-09 DIAGNOSIS — M79605 Pain in left leg: Secondary | ICD-10-CM

## 2023-07-11 ENCOUNTER — Ambulatory Visit: Admitting: Physical Therapy

## 2023-07-13 ENCOUNTER — Ambulatory Visit: Payer: Self-pay | Admitting: Cardiovascular Disease

## 2023-07-14 ENCOUNTER — Encounter: Payer: Self-pay | Admitting: Physical Therapy

## 2023-07-14 ENCOUNTER — Ambulatory Visit: Attending: Family Medicine | Admitting: Physical Therapy

## 2023-07-14 DIAGNOSIS — R2681 Unsteadiness on feet: Secondary | ICD-10-CM | POA: Diagnosis present

## 2023-07-14 DIAGNOSIS — R2689 Other abnormalities of gait and mobility: Secondary | ICD-10-CM | POA: Insufficient documentation

## 2023-07-14 DIAGNOSIS — R262 Difficulty in walking, not elsewhere classified: Secondary | ICD-10-CM | POA: Insufficient documentation

## 2023-07-14 DIAGNOSIS — M6281 Muscle weakness (generalized): Secondary | ICD-10-CM | POA: Diagnosis present

## 2023-07-14 DIAGNOSIS — R278 Other lack of coordination: Secondary | ICD-10-CM | POA: Insufficient documentation

## 2023-07-14 DIAGNOSIS — R293 Abnormal posture: Secondary | ICD-10-CM | POA: Insufficient documentation

## 2023-07-14 NOTE — Patient Instructions (Signed)
 Access Code: [email or web address] URL: [email or web address] Date: 07/14/2023 Prepared by: Daved Bull  Exercises - Sit to Stand with Arms Crossed  - 1 x daily - 4 x weekly - 2 sets - 8 reps - Standing March with Counter Support  - 1 x daily - 4 x weekly - 2 sets - 20 reps - Standing Hip Abduction with Counter Support  - 1 x daily - 4 x weekly - 2 sets - 8 reps - Corner Balance Feet Together With Eyes Closed  - 1 x daily - 4 x weekly - 1 sets - 3 reps - 30 seconds hold - Seated Scapular Retraction  - 1 x daily - 4 x weekly - 2 sets - 10 reps - Backward Walking with Counter Support  - 1 x daily - 4 x weekly - 3 sets - 10 reps - Walking with Head Rotation  - 1 x daily - 4 x weekly - 3 sets - 10 reps - Walking with Head Nod  - 1 x daily - 4 x weekly - 3 sets - 10 reps

## 2023-07-14 NOTE — Therapy (Signed)
 OUTPATIENT PHYSICAL THERAPY NEURO TREATMENT   Patient Name: Sarah [name] MRN: [id number] DOB:08/20/1945, 78 y.o., female Today's Date: 07/14/2023   PCP: [place], [name], MD REFERRING PROVIDER: [place], [name], MD   END OF SESSION:  PT End of Session - 07/14/23 1631     Visit Number 8    Number of Visits 17    Date for PT Re-Evaluation 07/31/23    Authorization Type [employer]    Progress Note Due on Visit 10    PT Start Time 1625    PT Stop Time 1708    PT Time Calculation (min) 43 min    Equipment Utilized During Treatment Gait belt    Activity Tolerance Patient limited by fatigue;Patient tolerated treatment well    Behavior During Therapy [facility] for tasks assessed/performed          Past Medical History:  Diagnosis Date   Bilateral carpal tunnel syndrome 03/27/2019   Boils    Glaucoma    Heart murmur    HTN (hypertension)    Hx of adenomatous colonic polyps    Hyperlipidemia    Osteoporosis    Pneumonia    Stroke (HCC)    Type II or unspecified type diabetes mellitus without mention of complication, uncontrolled    Past Surgical History:  Procedure Laterality Date   ABDOMINAL HYSTERECTOMY     CARPAL TUNNEL RELEASE     right   COLONOSCOPY  12-10-10   per Dr. Debrah, clear, repeat in 7 yrs    CORONARY STENT INTERVENTION N/A 03/11/2020   Procedure: CORONARY STENT INTERVENTION;  Surgeon: Dann Candyce RAMAN, MD;  Location: [facility] INVASIVE CV LAB;  Service: Cardiovascular;  Laterality: N/A;   CORONARY ULTRASOUND/IVUS N/A 03/11/2020   Procedure: Intravascular Ultrasound/IVUS;  Surgeon: Dann Candyce RAMAN, MD;  Location: [facility] INVASIVE CV LAB;  Service: Cardiovascular;  Laterality: N/A;   INCISION AND DRAINAGE PERIRECTAL ABSCESS N/A 09/26/2015   Procedure: IRRIGATION AND DEBRIDEMENT PERIRECTAL ABSCESS;  Surgeon: Herlene Beverley Bureau, MD;  Location: [facility] OR;  Service: General;  Laterality: N/A;   KNEE ARTHROSCOPY     right   LEFT HEART CATH AND CORONARY ANGIOGRAPHY N/A  03/11/2020   Procedure: LEFT HEART CATH AND CORONARY ANGIOGRAPHY;  Surgeon: Dann Candyce RAMAN, MD;  Location: [facility] INVASIVE CV LAB;  Service: Cardiovascular;  Laterality: N/A;   LOOP RECORDER INSERTION N/A 10/19/2016   Procedure: LOOP RECORDER INSERTION;  Surgeon: Kelsie Agent, MD;  Location: [facility] INVASIVE CV LAB;  Service: Cardiovascular;  Laterality: N/A;   ORIF ANKLE FRACTURE Right 03/24/2012   Procedure: OPEN REDUCTION INTERNAL FIXATION (ORIF) ANKLE FRACTURE;  Surgeon: Jerona LULLA Sage, MD;  Location: [facility] OR;  Service: Orthopedics;  Laterality: Right;  Open Reduction Internal Fixation Right Bimalleolar ankle fracture   POLYPECTOMY     TEE WITHOUT CARDIOVERSION N/A 10/18/2016   Procedure: TRANSESOPHAGEAL ECHOCARDIOGRAM (TEE);  Surgeon: Okey Vina LULLA, MD;  Location: [facility] ENDOSCOPY;  Service: Cardiovascular;  Laterality: N/A;   TEE WITHOUT CARDIOVERSION N/A 10/14/2022   Procedure: TRANSESOPHAGEAL ECHOCARDIOGRAM;  Surgeon: Pietro Redell RAMAN, MD;  Location: [facility] INVASIVE CV LAB;  Service: Cardiovascular;  Laterality: N/A;   TONSILLECTOMY     Patient Active Problem List   Diagnosis Date Noted   B12 deficiency 04/12/2023   Acute ischemic stroke (HCC) 10/13/2022   Generalized weakness 10/12/2022   Stroke (cerebrum) (HCC) 10/12/2022   Stroke-like symptoms 10/11/2022   Urge urinary incontinence 07/02/2022   Seborrheic keratosis 07/02/2022   Pain of right lower extremity 12/11/2021  Nausea and vomiting in adult 12/11/2021   Elevated troponin    Change in mental status 10/24/2021   Long term current use of antithrombotics/antiplatelets 10/02/2021   Heart murmur 04/08/2021   Dizziness 04/08/2021   Long term (current) use of antithrombotics/antiplatelets 12/16/2020   Chronic constipation 12/16/2020   Colon cancer screening 12/16/2020   History of adenomatous polyp of colon 12/16/2020   Coronary artery disease of native artery of native heart with stable angina pectoris (HCC) 03/04/2020   Diabetes mellitus  (HCC) 03/03/2020   Aortic atherosclerosis (HCC) 02/12/2020   Bilateral leg cramps 01/21/2020   Bilateral lower extremity edema 01/21/2020   Anxiety disorder 01/21/2020   Type 2 diabetes mellitus with hyperglycemia, with long-term current use of insulin  (HCC) 08/15/2019   Type 2 diabetes mellitus with diabetic polyneuropathy, with long-term current use of insulin  (HCC) 08/15/2019   Uncontrolled type 2 diabetes mellitus with hypoglycemia without coma (HCC) 08/15/2019   Hyperlipidemia LDL goal <70 08/15/2019   Bilateral carpal tunnel syndrome 03/27/2019   Osteoarthritis of both knees 11/27/2018   CKD (chronic kidney disease), stage II 11/27/2018   GERD (gastroesophageal reflux disease) 06/26/2018   Homonymous hemianopia, left 02/08/2018   Cerebrovascular accident (CVA) due to embolism of cerebral artery (HCC)    Diabetes mellitus with coincident hypertension (HCC)    Trigger finger, right index finger 10/17/2017   Low back pain 07/29/2017   Carotid artery stenosis 11/02/2016   Left arm weakness    Diastolic dysfunction    Hypertension with heart disease    Acute blood loss anemia    Cerebral infarction due to embolism of right middle cerebral artery (HCC)    Perirectal abscess 09/26/2015   Vitamin D  deficiency 07/23/2015   Osteopenia 02/16/2013   Ankle fracture 01/19/2013   Anemia 09/27/2011   Type II diabetes mellitus with nephropathy (HCC) 09/24/2011   Mixed hyperlipidemia 09/24/2011   Primary hypertension 09/24/2011    ONSET DATE: 05/18/2023  REFERRING DIAG: M79.605 (ICD-10-CM) - Pain of left anterior lower extremity M54.50 (ICD-10-CM) - Bilateral low back pain without sciatica, unspecified chronicity I63.9 (ICD-10-CM) - Cerebrovascular accident (CVA), unspecified mechanism (HCC)    THERAPY DIAG:  Muscle weakness (generalized)  Other abnormalities of gait and mobility  Unsteadiness on feet  Difficulty in walking, not elsewhere classified  Other lack of  coordination  Abnormal posture  Rationale for Evaluation and Treatment: Rehabilitation  SUBJECTIVE:                                                                                                                                                                                             SUBJECTIVE STATEMENT:  Nothing new, no falls. She is rarely working on her HEP because of her doctor's appts.  She requests to add more visits.  Her vision is worse per report - she followed up with her eye doctor Tuesday and states she is to continue her drops, the nerve swelling remains from the CVA, and she is to follow-up with them in a year.  She feels she did not return to her normal after having her eyes dilated as she can't see as well as she could.  Pt accompanied by: self  PERTINENT HISTORY: PMH: CAD, HTN, hx of multiple CVAs, DM II, CKD II, anxiety, PAD, and GERD    history of  several small infarcts at left brain watershed areas and left cerebellum in 10/2022 concerning for cardioembolic source, and hx of punctate left cerebellar and left parietal lobe infarcts in 10/2021 likely incidental finding on MRI during encephalopathy work-up of unclear etiology, hx of right parieto-occipital infarcts in setting of progressive right MCA stenosis in 2020 likely secondary to large vessel disease source and right MCA stroke in 2018 s/p ILR without evidence of A-fib during 4 year monitoring duration   Per PCP: Patient complains of anterior left leg pain for over a week, anterior thigh to pretibial area, sometimes she feels like pain is radiating from her back down her entire her leg.  She says the pain is intermittent, and rates it as an 8/10. It occasionally wakes her up at night.   PAIN:  Are you having pain? No   There were no vitals filed for this visit.      PRECAUTIONS: Fall   FALLS: Has patient fallen in last 6 months? No  LIVING ENVIRONMENT: Lives with: lives with mom (who is 65), has aide  that comes in everyday and sister lives down the street and sister comes by everyday  Aide is there M-F, sister on weekends  Lives in: House/apartment Stairs: Yes: Internal: 10 steps; railings on both side and then single railing and External: from front door there are 2 steps, back door is the garage and another 2 steps; none Has following equipment at home: Single point cane, Walker - 2 wheeled, shower chair, and Grab bars Aide makes the meals, sister sets up weekly medications and does chores  PLOF: Independent with household mobility with device, Independent with community mobility with device, and Needs assistance with homemaking No driving   PATIENT GOALS: Wants to walk better and have better balance   OBJECTIVE:  Note: Objective measures were completed at Evaluation unless otherwise noted.  DIAGNOSTIC FINDINGS: MRI brain 10/12/22: IMPRESSION: 1. Small acute infarcts scattered in the left cerebral hemisphere and left cerebellum. 2. Extensive chronic small vessel disease and right cerebral cortex infarcts.  COGNITION: Overall cognitive status: Impaired   SENSATION: Light touch: Impaired , more difficult to detect in RLE   COORDINATION: Slightly dysmetric movements in BLE    POSTURE: rounded shoulders and forward head   LOWER EXTREMITY MMT:    MMT Right Eval Left Eval  Hip flexion 4 4  Hip extension    Hip abduction 4 4  Hip adduction 4 4  Hip internal rotation    Hip external rotation    Knee flexion 4 4  Knee extension 4+ 4+  Ankle dorsiflexion 4 4  Ankle plantarflexion    Ankle inversion    Ankle eversion    (Blank rows = not tested)  All tested in sitting  Decr DF ROM in R ankle due to hx  of surgery   BED MOBILITY:  Pt denies difficulty getting in and out of the bed at home   TRANSFERS: Sit to stand: SBA and CGA  Assistive device utilized: None     Stand to sit: SBA  Assistive device utilized: None      Pt needing BUE support to come to stand  and more reliant on arms, decr forward lean, one episode of CGA in standing for balance, decr eccentric control back to chair   GAIT: Findings: Gait Characteristics: step through pattern, decreased stance time- Right, decreased stance time- Left, decreased stride length, Right foot flat, Left foot flat, poor foot clearance- Right, and poor foot clearance- Left, Unsteadiness with short shuffled steps  Distance walked: Clinic distances ,  Assistive device utilized:Single point cane,  Level of assistance: CGA, and Comments: Lowered pt's cane as it was initially too high Pt intermittently hitting R foot to cane, needing CGA/min A for balance   FUNCTIONAL TESTS:  5 times sit to stand: 21.4 seconds with BUE support  Timed up and go (TUG): 31.5 seconds with SPC and CGA 10 meter walk test: 31.8 seconds = 1.03 ft/sec with SPC and CGA                                                                                                                                TREATMENT DATE: 07/14/23 -Adjusted pt schedule - added 3 visits within visit number and cert date/goal assessment due.  Discussed need to see progress on goal assessment in order to justify more PT.  Verbally reviewed HEP and encouraged compliance. -Arm bike x4 minutes forwards and 4 minutes backwards for cardiac endurance and activity tolerance on level 1.0, needs one seated recovery w/ paced breathing ~1.5 minutes to finish retro pedaling portion -Seated scapular retraction x15 w/ return demo -Counter walking w/ head nods 4x12 ft, SBA -Counter walking w/ head turns 4x12 ft, SBA -Counter walking backwards 4x12 ft, SBA, cued for increased foot clearance  PATIENT EDUCATION: Education details: Continue HEP (please work towards 4 days per week, can break up into groups of exercise as needed) - additions made today recommending supervision for initial practice safety, ongoing rollator brake management particularly with sitting, alignment to surface  when going to sit.  Diaphragmatic breathing throughout activity to improve tolerance and continuity. Person educated: Patient Education method: Explanation Education comprehension: verbalized understanding and needs further education  HOME EXERCISE PROGRAM: *Please print additions in largest format per pt request! Access Code: [email or web address] URL: [email or web address] Date: 07/14/2023 Prepared by: Daved Bull  Exercises - Sit to Stand with Arms Crossed  - 1 x daily - 4 x weekly - 2 sets - 8 reps - Standing March with Counter Support  - 1 x daily - 4 x weekly - 2 sets - 20 reps - Standing Hip Abduction with Counter Support  - 1 x daily - 4 x weekly - 2 sets - 8 reps - Corner  Balance Feet Together With Eyes Closed  - 1 x daily - 4 x weekly - 1 sets - 3 reps - 30 seconds hold - Seated Scapular Retraction  - 1 x daily - 4 x weekly - 2 sets - 10 reps - Backward Walking with Counter Support  - 1 x daily - 4 x weekly - 3 sets - 10 reps - Walking with Head Rotation  - 1 x daily - 4 x weekly - 3 sets - 10 reps (at counter) - Walking with Head Nod  - 1 x daily - 4 x weekly - 3 sets - 10 reps (at counter)  GOALS: Goals reviewed with patient? Yes  SHORT TERM GOALS: Target date: 06/29/2023  Pt will be independent with initial HEP for strength, gait, balance in order to build upon functional gains made in therapy. Baseline: Goal status: PARTIALLY MET  2.  Pt will improve 5x sit<>stand to less than or equal to 18 sec to demonstrate improved functional strength and transfer efficiency.   Baseline: 21.4 seconds with BUE support  21 seconds with BUE support (6/16) Goal status: NOT MET  3.  Pt will improve TUG time to 25 seconds or less with LRAD in order to demo decrease fall risk.  Baseline: 31.5 seconds with SPC and CGA  27.9 seconds with rollator with supervision  Goal status: NOT MET  4.  Pt will improve gait speed with LRAD to at least 1.5 ft/sec in order to demo  decr  fall risk  Baseline: 31.8 seconds = 1.03 ft/sec with SPC and CGA   22.1 seconds with with rollator = 1.47 ft/sec with supervision Goal status: MET  5.  Pt will increase BERG balance score to >/=42/56 to demonstrate improved static balance. Baseline: 37/56 (6/2)  40/56 Goal status: NOT MET   LONG TERM GOALS: Target date: 07/27/2023  Pt will be independent with final HEP for strength, gait, balance in order to build upon functional gains made in therapy. Baseline:  Goal status: INITIAL  2.  Pt will increase BERG balance score to >/=47/56 to demonstrate improved static balance. Baseline: 37/56 (6/2) Goal status: INITIAL  3.  Pt will improve 5x sit<>stand to less than or equal to 15 sec to demonstrate improved functional strength and transfer efficiency.  Baseline: 21.4 seconds with BUE support Goal status: INITIAL  4.  Pt will improve gait speed with LRAD to at least 2.0 ft/sec in order to demo improved community mobility.  Baseline: 31.8 seconds = 1.03 ft/sec with SPC and CGA Goal status: INITIAL  5.  Pt will improve TUG time to 21 seconds or less with LRAD in order to demo decrease fall risk. Baseline: 31.5 seconds with SPC and CGA Goal status: INITIAL    ASSESSMENT:  CLINICAL IMPRESSION: Emphasis of skilled session today on making additions to HEP to reflect postural correction and dynamic standing balance.  She continues to be limited in her endurance and benefits from ongoing work on cardiovascular tolerance and gait training.  PT will focus on continuing to improve her safety with STS, gait, and stair management.  PT to continue per POC.   OBJECTIVE IMPAIRMENTS: Abnormal gait, decreased activity tolerance, decreased balance, decreased cognition, decreased coordination, decreased endurance, decreased knowledge of use of DME, decreased mobility, difficulty walking, decreased strength, decreased safety awareness, impaired sensation, and postural dysfunction.   ACTIVITY  LIMITATIONS: carrying, lifting, bending, standing, squatting, stairs, transfers, and locomotion level  PARTICIPATION LIMITATIONS: meal prep, cleaning, medication management, driving, shopping, and community  activity  PERSONAL FACTORS: Age, Behavior pattern, Past/current experiences, Time since onset of injury/illness/exacerbation, and 3+ comorbidities: CAD, HTN, hx of multiple CVAs, DM II, CKD II, anxiety, PAD, and GERD are also affecting patient's functional outcome.   REHAB POTENTIAL: Good  CLINICAL DECISION MAKING: Evolving/moderate complexity  EVALUATION COMPLEXITY: Moderate  PLAN:  PT FREQUENCY: 2x/week  PT DURATION: 8 weeks  PLANNED INTERVENTIONS: 97164- PT Re-evaluation, 97110-Therapeutic exercises, 97530- Therapeutic activity, 97112- Neuromuscular re-education, 97535- Self Care, [id number]- Manual therapy, [phone]- Gait training, Patient/Family education, Balance training, Stair training, and DME instructions  PLAN FOR NEXT SESSION: How is HEP for strength, balance - expand as needed. Work on strength/balance/endurance in clinic, stair training; she may benefit from circuit style activity and maybe arm bike vs scifit; check glucose if able to.   Assess LTGs 7/15 - D/C?  Daved KATHEE Bull, PT, DPT 07/14/2023, 5:40 PM

## 2023-07-18 ENCOUNTER — Telehealth: Payer: Self-pay

## 2023-07-18 NOTE — Telephone Encounter (Signed)
 Copied from CRM [id number]. Topic: General - Other >> Jul 18, 2023 10:43 AM Revonda D wrote: Reason for CRM: Pt wanted to inform Dr.[name] that her next appt with her Kidney provider is on 7/15.

## 2023-07-19 ENCOUNTER — Ambulatory Visit: Payer: Self-pay | Admitting: Physical Therapy

## 2023-07-19 ENCOUNTER — Encounter: Payer: Self-pay | Admitting: Physical Therapy

## 2023-07-19 DIAGNOSIS — R2681 Unsteadiness on feet: Secondary | ICD-10-CM

## 2023-07-19 DIAGNOSIS — R293 Abnormal posture: Secondary | ICD-10-CM

## 2023-07-19 DIAGNOSIS — M6281 Muscle weakness (generalized): Secondary | ICD-10-CM

## 2023-07-19 DIAGNOSIS — R2689 Other abnormalities of gait and mobility: Secondary | ICD-10-CM

## 2023-07-19 DIAGNOSIS — R262 Difficulty in walking, not elsewhere classified: Secondary | ICD-10-CM

## 2023-07-19 DIAGNOSIS — R278 Other lack of coordination: Secondary | ICD-10-CM

## 2023-07-19 NOTE — Therapy (Signed)
 OUTPATIENT PHYSICAL THERAPY NEURO TREATMENT   Patient Name: Sarah [name] MRN: [id number] DOB:02/08/1945, 78 y.o., female Today's Date: 07/19/2023   PCP: [place], [name], MD REFERRING PROVIDER: [place], [name], MD   END OF SESSION:  PT End of Session - 07/19/23 1424     Visit Number 9    Number of Visits 17    Date for PT Re-Evaluation 07/31/23    Authorization Type [employer]    Progress Note Due on Visit 10    PT Start Time 1404    PT Stop Time 1444    PT Time Calculation (min) 40 min    Equipment Utilized During Treatment Gait belt    Activity Tolerance Patient limited by fatigue;Patient tolerated treatment well    Behavior During Therapy [facility] for tasks assessed/performed          Past Medical History:  Diagnosis Date   Bilateral carpal tunnel syndrome 03/27/2019   Boils    Glaucoma    Heart murmur    HTN (hypertension)    Hx of adenomatous colonic polyps    Hyperlipidemia    Osteoporosis    Pneumonia    Stroke (HCC)    Type II or unspecified type diabetes mellitus without mention of complication, uncontrolled    Past Surgical History:  Procedure Laterality Date   ABDOMINAL HYSTERECTOMY     CARPAL TUNNEL RELEASE     right   COLONOSCOPY  12-10-10   per Dr. Debrah, clear, repeat in 7 yrs    CORONARY STENT INTERVENTION N/A 03/11/2020   Procedure: CORONARY STENT INTERVENTION;  Surgeon: Dann Candyce RAMAN, MD;  Location: [facility] INVASIVE CV LAB;  Service: Cardiovascular;  Laterality: N/A;   CORONARY ULTRASOUND/IVUS N/A 03/11/2020   Procedure: Intravascular Ultrasound/IVUS;  Surgeon: Dann Candyce RAMAN, MD;  Location: [facility] INVASIVE CV LAB;  Service: Cardiovascular;  Laterality: N/A;   INCISION AND DRAINAGE PERIRECTAL ABSCESS N/A 09/26/2015   Procedure: IRRIGATION AND DEBRIDEMENT PERIRECTAL ABSCESS;  Surgeon: Herlene Beverley Bureau, MD;  Location: [facility] OR;  Service: General;  Laterality: N/A;   KNEE ARTHROSCOPY     right   LEFT HEART CATH AND CORONARY ANGIOGRAPHY N/A  03/11/2020   Procedure: LEFT HEART CATH AND CORONARY ANGIOGRAPHY;  Surgeon: Dann Candyce RAMAN, MD;  Location: [facility] INVASIVE CV LAB;  Service: Cardiovascular;  Laterality: N/A;   LOOP RECORDER INSERTION N/A 10/19/2016   Procedure: LOOP RECORDER INSERTION;  Surgeon: Kelsie Agent, MD;  Location: [facility] INVASIVE CV LAB;  Service: Cardiovascular;  Laterality: N/A;   ORIF ANKLE FRACTURE Right 03/24/2012   Procedure: OPEN REDUCTION INTERNAL FIXATION (ORIF) ANKLE FRACTURE;  Surgeon: Jerona LULLA Sage, MD;  Location: [facility] OR;  Service: Orthopedics;  Laterality: Right;  Open Reduction Internal Fixation Right Bimalleolar ankle fracture   POLYPECTOMY     TEE WITHOUT CARDIOVERSION N/A 10/18/2016   Procedure: TRANSESOPHAGEAL ECHOCARDIOGRAM (TEE);  Surgeon: Okey Vina LULLA, MD;  Location: [facility] ENDOSCOPY;  Service: Cardiovascular;  Laterality: N/A;   TEE WITHOUT CARDIOVERSION N/A 10/14/2022   Procedure: TRANSESOPHAGEAL ECHOCARDIOGRAM;  Surgeon: Pietro Redell RAMAN, MD;  Location: [facility] INVASIVE CV LAB;  Service: Cardiovascular;  Laterality: N/A;   TONSILLECTOMY     Patient Active Problem List   Diagnosis Date Noted   B12 deficiency 04/12/2023   Acute ischemic stroke (HCC) 10/13/2022   Generalized weakness 10/12/2022   Stroke (cerebrum) (HCC) 10/12/2022   Stroke-like symptoms 10/11/2022   Urge urinary incontinence 07/02/2022   Seborrheic keratosis 07/02/2022   Pain of right lower extremity 12/11/2021  Nausea and vomiting in adult 12/11/2021   Elevated troponin    Change in mental status 10/24/2021   Long term current use of antithrombotics/antiplatelets 10/02/2021   Heart murmur 04/08/2021   Dizziness 04/08/2021   Long term (current) use of antithrombotics/antiplatelets 12/16/2020   Chronic constipation 12/16/2020   Colon cancer screening 12/16/2020   History of adenomatous polyp of colon 12/16/2020   Coronary artery disease of native artery of native heart with stable angina pectoris (HCC) 03/04/2020   Diabetes mellitus  (HCC) 03/03/2020   Aortic atherosclerosis (HCC) 02/12/2020   Bilateral leg cramps 01/21/2020   Bilateral lower extremity edema 01/21/2020   Anxiety disorder 01/21/2020   Type 2 diabetes mellitus with hyperglycemia, with long-term current use of insulin  (HCC) 08/15/2019   Type 2 diabetes mellitus with diabetic polyneuropathy, with long-term current use of insulin  (HCC) 08/15/2019   Uncontrolled type 2 diabetes mellitus with hypoglycemia without coma (HCC) 08/15/2019   Hyperlipidemia LDL goal <70 08/15/2019   Bilateral carpal tunnel syndrome 03/27/2019   Osteoarthritis of both knees 11/27/2018   CKD (chronic kidney disease), stage II 11/27/2018   GERD (gastroesophageal reflux disease) 06/26/2018   Homonymous hemianopia, left 02/08/2018   Cerebrovascular accident (CVA) due to embolism of cerebral artery (HCC)    Diabetes mellitus with coincident hypertension (HCC)    Trigger finger, right index finger 10/17/2017   Low back pain 07/29/2017   Carotid artery stenosis 11/02/2016   Left arm weakness    Diastolic dysfunction    Hypertension with heart disease    Acute blood loss anemia    Cerebral infarction due to embolism of right middle cerebral artery (HCC)    Perirectal abscess 09/26/2015   Vitamin D  deficiency 07/23/2015   Osteopenia 02/16/2013   Ankle fracture 01/19/2013   Anemia 09/27/2011   Type II diabetes mellitus with nephropathy (HCC) 09/24/2011   Mixed hyperlipidemia 09/24/2011   Primary hypertension 09/24/2011    ONSET DATE: 05/18/2023  REFERRING DIAG: M79.605 (ICD-10-CM) - Pain of left anterior lower extremity M54.50 (ICD-10-CM) - Bilateral low back pain without sciatica, unspecified chronicity I63.9 (ICD-10-CM) - Cerebrovascular accident (CVA), unspecified mechanism (HCC)    THERAPY DIAG:  Muscle weakness (generalized)  Other abnormalities of gait and mobility  Unsteadiness on feet  Difficulty in walking, not elsewhere classified  Other lack of  coordination  Abnormal posture  Rationale for Evaluation and Treatment: Rehabilitation  SUBJECTIVE:                                                                                                                                                                                             SUBJECTIVE STATEMENT:  Nothing new, no falls. She needs extra time to find glucose monitor so did not check at onset of session.  She has had a headache off and on today.  Pt accompanied by: self  PERTINENT HISTORY: PMH: CAD, HTN, hx of multiple CVAs, DM II, CKD II, anxiety, PAD, and GERD    history of  several small infarcts at left brain watershed areas and left cerebellum in 10/2022 concerning for cardioembolic source, and hx of punctate left cerebellar and left parietal lobe infarcts in 10/2021 likely incidental finding on MRI during encephalopathy work-up of unclear etiology, hx of right parieto-occipital infarcts in setting of progressive right MCA stenosis in 2020 likely secondary to large vessel disease source and right MCA stroke in 2018 s/p ILR without evidence of A-fib during 4 year monitoring duration   Per PCP: Patient complains of anterior left leg pain for over a week, anterior thigh to pretibial area, sometimes she feels like pain is radiating from her back down her entire her leg.  She says the pain is intermittent, and rates it as an 8/10. It occasionally wakes her up at night.   PAIN:  Are you having pain? No   There were no vitals filed for this visit.      PRECAUTIONS: Fall   FALLS: Has patient fallen in last 6 months? No  LIVING ENVIRONMENT: Lives with: lives with mom (who is 69), has aide that comes in everyday and sister lives down the street and sister comes by everyday  Aide is there M-F, sister on weekends  Lives in: House/apartment Stairs: Yes: Internal: 10 steps; railings on both side and then single railing and External: from front door there are 2 steps, back door is  the garage and another 2 steps; none Has following equipment at home: Single point cane, Walker - 2 wheeled, shower chair, and Grab bars Aide makes the meals, sister sets up weekly medications and does chores  PLOF: Independent with household mobility with device, Independent with community mobility with device, and Needs assistance with homemaking No driving   PATIENT GOALS: Wants to walk better and have better balance   OBJECTIVE:  Note: Objective measures were completed at Evaluation unless otherwise noted.  DIAGNOSTIC FINDINGS: MRI brain 10/12/22: IMPRESSION: 1. Small acute infarcts scattered in the left cerebral hemisphere and left cerebellum. 2. Extensive chronic small vessel disease and right cerebral cortex infarcts.  COGNITION: Overall cognitive status: Impaired   SENSATION: Light touch: Impaired , more difficult to detect in RLE   COORDINATION: Slightly dysmetric movements in BLE    POSTURE: rounded shoulders and forward head   LOWER EXTREMITY MMT:    MMT Right Eval Left Eval  Hip flexion 4 4  Hip extension    Hip abduction 4 4  Hip adduction 4 4  Hip internal rotation    Hip external rotation    Knee flexion 4 4  Knee extension 4+ 4+  Ankle dorsiflexion 4 4  Ankle plantarflexion    Ankle inversion    Ankle eversion    (Blank rows = not tested)  All tested in sitting  Decr DF ROM in R ankle due to hx of surgery   BED MOBILITY:  Pt denies difficulty getting in and out of the bed at home   TRANSFERS: Sit to stand: SBA and CGA  Assistive device utilized: None     Stand to sit: SBA  Assistive device utilized: None      Pt needing BUE support to come to  stand and more reliant on arms, decr forward lean, one episode of CGA in standing for balance, decr eccentric control back to chair   GAIT: Findings: Gait Characteristics: step through pattern, decreased stance time- Right, decreased stance time- Left, decreased stride length, Right foot flat, Left  foot flat, poor foot clearance- Right, and poor foot clearance- Left, Unsteadiness with short shuffled steps  Distance walked: Clinic distances ,  Assistive device utilized:Single point cane,  Level of assistance: CGA, and Comments: Lowered pt's cane as it was initially too high Pt intermittently hitting R foot to cane, needing CGA/min A for balance   FUNCTIONAL TESTS:  5 times sit to stand: 21.4 seconds with BUE support  Timed up and go (TUG): 31.5 seconds with SPC and CGA 10 meter walk test: 31.8 seconds = 1.03 ft/sec with SPC and CGA                                                                                                                                TREATMENT DATE: 07/19/23  -Arm bike 2x2 minutes forwards and 2 minutes backwards for cardiac endurance and activity tolerance on level 1.0, used seated recovery between direction changes to improve tolerance, she has a mild headache following task - BP 124/60, HR 80bpm and blood glucose 161.  She declined water but requested something else so provided coca[employer]a per preference.  Circuit 1 (completed x3 w/ mod encouragement to initiate movement):   -STS no UE support x6  -Standing eyes closed x20 sec SBA  -Walk 115' SBA w/ rollator  Pt requests to go to restroom at end of session so PT ambulates with pt instructing to use large step through gait pattern w/ improve proximity to AD.  Cues to sequence more independently through restroom door and edu on safety in restroom and using call button on wall if needed.  PATIENT EDUCATION: Education details: Ongoing 7/8:  Continue HEP (please work towards 4 days per week, can break up into groups of exercise as needed) - additions made today recommending supervision for initial practice safety, ongoing rollator brake management particularly with sitting, alignment to surface when going to sit.  Diaphragmatic breathing throughout activity to improve tolerance and continuity. Person educated:  Patient Education method: Explanation Education comprehension: verbalized understanding and needs further education  HOME EXERCISE PROGRAM: *Please print additions in largest format per pt request! Access Code: [email or web address] URL: [email or web address] Date: 07/14/2023 Prepared by: Daved Bull  Exercises - Sit to Stand with Arms Crossed  - 1 x daily - 4 x weekly - 2 sets - 8 reps - Standing March with Counter Support  - 1 x daily - 4 x weekly - 2 sets - 20 reps - Standing Hip Abduction with Counter Support  - 1 x daily - 4 x weekly - 2 sets - 8 reps - Corner Balance Feet Together With Eyes Closed  - 1 x daily - 4 x weekly - 1 sets -  3 reps - 30 seconds hold - Seated Scapular Retraction  - 1 x daily - 4 x weekly - 2 sets - 10 reps - Backward Walking with Counter Support  - 1 x daily - 4 x weekly - 3 sets - 10 reps - Walking with Head Rotation  - 1 x daily - 4 x weekly - 3 sets - 10 reps (at counter) - Walking with Head Nod  - 1 x daily - 4 x weekly - 3 sets - 10 reps (at counter)  GOALS: Goals reviewed with patient? Yes  SHORT TERM GOALS: Target date: 06/29/2023  Pt will be independent with initial HEP for strength, gait, balance in order to build upon functional gains made in therapy. Baseline: Goal status: PARTIALLY MET  2.  Pt will improve 5x sit<>stand to less than or equal to 18 sec to demonstrate improved functional strength and transfer efficiency.   Baseline: 21.4 seconds with BUE support  21 seconds with BUE support (6/16) Goal status: NOT MET  3.  Pt will improve TUG time to 25 seconds or less with LRAD in order to demo decrease fall risk.  Baseline: 31.5 seconds with SPC and CGA  27.9 seconds with rollator with supervision  Goal status: NOT MET  4.  Pt will improve gait speed with LRAD to at least 1.5 ft/sec in order to demo  decr fall risk  Baseline: 31.8 seconds = 1.03 ft/sec with SPC and CGA   22.1 seconds with with rollator = 1.47 ft/sec with  supervision Goal status: MET  5.  Pt will increase BERG balance score to >/=42/56 to demonstrate improved static balance. Baseline: 37/56 (6/2)  40/56 Goal status: NOT MET   LONG TERM GOALS: Target date: 07/27/2023  Pt will be independent with final HEP for strength, gait, balance in order to build upon functional gains made in therapy. Baseline:  Goal status: INITIAL  2.  Pt will increase BERG balance score to >/=47/56 to demonstrate improved static balance. Baseline: 37/56 (6/2) Goal status: INITIAL  3.  Pt will improve 5x sit<>stand to less than or equal to 15 sec to demonstrate improved functional strength and transfer efficiency.  Baseline: 21.4 seconds with BUE support Goal status: INITIAL  4.  Pt will improve gait speed with LRAD to at least 2.0 ft/sec in order to demo improved community mobility.  Baseline: 31.8 seconds = 1.03 ft/sec with SPC and CGA Goal status: INITIAL  5.  Pt will improve TUG time to 21 seconds or less with LRAD in order to demo decrease fall risk. Baseline: 31.5 seconds with SPC and CGA Goal status: INITIAL    ASSESSMENT:  CLINICAL IMPRESSION: Emphasis of skilled session today on continued improvement in endurance.  Pt remains limited in continuous activity tolerance so began circuit style training with some improvement using low demand tasks.  She would benefit from repeated style of task next visit.  PT to continue per POC.   OBJECTIVE IMPAIRMENTS: Abnormal gait, decreased activity tolerance, decreased balance, decreased cognition, decreased coordination, decreased endurance, decreased knowledge of use of DME, decreased mobility, difficulty walking, decreased strength, decreased safety awareness, impaired sensation, and postural dysfunction.   ACTIVITY LIMITATIONS: carrying, lifting, bending, standing, squatting, stairs, transfers, and locomotion level  PARTICIPATION LIMITATIONS: meal prep, cleaning, medication management, driving, shopping,  and community activity  PERSONAL FACTORS: Age, Behavior pattern, Past/current experiences, Time since onset of injury/illness/exacerbation, and 3+ comorbidities: CAD, HTN, hx of multiple CVAs, DM II, CKD II, anxiety, PAD, and GERD are  also affecting patient's functional outcome.   REHAB POTENTIAL: Good  CLINICAL DECISION MAKING: Evolving/moderate complexity  EVALUATION COMPLEXITY: Moderate  PLAN:  PT FREQUENCY: 2x/week  PT DURATION: 8 weeks  PLANNED INTERVENTIONS: 97164- PT Re-evaluation, 97110-Therapeutic exercises, 97530- Therapeutic activity, 97112- Neuromuscular re-education, 97535- Self Care, [id number]- Manual therapy, [phone]- Gait training, Patient/Family education, Balance training, Stair training, and DME instructions  PLAN FOR NEXT SESSION: How is HEP for strength, balance - expand as needed. Work on strength/balance/endurance in clinic, stair training; she may benefit from circuit style activity and maybe arm bike vs scifit; check glucose if able to.   Assess LTGs 7/15 - D/C?  Daved KATHEE Bull, PT, DPT 07/19/2023, 2:45 PM

## 2023-07-25 ENCOUNTER — Ambulatory Visit: Payer: Self-pay | Admitting: Physical Therapy

## 2023-07-25 ENCOUNTER — Encounter: Payer: Self-pay | Admitting: Physical Therapy

## 2023-07-25 DIAGNOSIS — R262 Difficulty in walking, not elsewhere classified: Secondary | ICD-10-CM

## 2023-07-25 DIAGNOSIS — R2689 Other abnormalities of gait and mobility: Secondary | ICD-10-CM

## 2023-07-25 DIAGNOSIS — M6281 Muscle weakness (generalized): Secondary | ICD-10-CM

## 2023-07-25 DIAGNOSIS — R2681 Unsteadiness on feet: Secondary | ICD-10-CM

## 2023-07-25 DIAGNOSIS — R278 Other lack of coordination: Secondary | ICD-10-CM

## 2023-07-25 DIAGNOSIS — R293 Abnormal posture: Secondary | ICD-10-CM

## 2023-07-25 NOTE — Therapy (Signed)
 OUTPATIENT PHYSICAL THERAPY NEURO TREATMENT - 10th Visit PN   Patient Name: Sarah [name] MRN: [id number] DOB:08-20-1945, 78 y.o., female Today's Date: 07/25/2023  PT progress note for Avelina Sauers.  Reporting period 06/01/2023 to 07/25/2023  See Note below for Objective Data and Assessment of Progress/Goals  Thank you for the referral of this patient. Daved Bull, PT, DPT  PCP: [place], [name], MD REFERRING PROVIDER: [place], [name], MD   END OF SESSION:  PT End of Session - 07/25/23 1544     Visit Number 10    Number of Visits 17    Date for PT Re-Evaluation 07/31/23    Authorization Type [employer] Medicare    Progress Note Due on Visit 10    PT Start Time 1537    PT Stop Time 1606    PT Time Calculation (min) 29 min    Equipment Utilized During Treatment Gait belt    Activity Tolerance Patient limited by fatigue;Patient tolerated treatment well    Behavior During Therapy [facility] for tasks assessed/performed          Past Medical History:  Diagnosis Date   Bilateral carpal tunnel syndrome 03/27/2019   Boils    Glaucoma    Heart murmur    HTN (hypertension)    Hx of adenomatous colonic polyps    Hyperlipidemia    Osteoporosis    Pneumonia    Stroke (HCC)    Type II or unspecified type diabetes mellitus without mention of complication, uncontrolled    Past Surgical History:  Procedure Laterality Date   ABDOMINAL HYSTERECTOMY     CARPAL TUNNEL RELEASE     right   COLONOSCOPY  12-10-10   per Dr. Debrah, clear, repeat in 7 yrs    CORONARY STENT INTERVENTION N/A 03/11/2020   Procedure: CORONARY STENT INTERVENTION;  Surgeon: Dann Candyce RAMAN, MD;  Location: [facility] INVASIVE CV LAB;  Service: Cardiovascular;  Laterality: N/A;   CORONARY ULTRASOUND/IVUS N/A 03/11/2020   Procedure: Intravascular Ultrasound/IVUS;  Surgeon: Dann Candyce RAMAN, MD;  Location: [facility] INVASIVE CV LAB;  Service: Cardiovascular;  Laterality: N/A;   INCISION AND DRAINAGE PERIRECTAL  ABSCESS N/A 09/26/2015   Procedure: IRRIGATION AND DEBRIDEMENT PERIRECTAL ABSCESS;  Surgeon: Herlene Beverley Bureau, MD;  Location: [facility] OR;  Service: General;  Laterality: N/A;   KNEE ARTHROSCOPY     right   LEFT HEART CATH AND CORONARY ANGIOGRAPHY N/A 03/11/2020   Procedure: LEFT HEART CATH AND CORONARY ANGIOGRAPHY;  Surgeon: Dann Candyce RAMAN, MD;  Location: [facility] INVASIVE CV LAB;  Service: Cardiovascular;  Laterality: N/A;   LOOP RECORDER INSERTION N/A 10/19/2016   Procedure: LOOP RECORDER INSERTION;  Surgeon: Kelsie Agent, MD;  Location: [facility] INVASIVE CV LAB;  Service: Cardiovascular;  Laterality: N/A;   ORIF ANKLE FRACTURE Right 03/24/2012   Procedure: OPEN REDUCTION INTERNAL FIXATION (ORIF) ANKLE FRACTURE;  Surgeon: Jerona LULLA Sage, MD;  Location: [facility] OR;  Service: Orthopedics;  Laterality: Right;  Open Reduction Internal Fixation Right Bimalleolar ankle fracture   POLYPECTOMY     TEE WITHOUT CARDIOVERSION N/A 10/18/2016   Procedure: TRANSESOPHAGEAL ECHOCARDIOGRAM (TEE);  Surgeon: Okey Vina LULLA, MD;  Location: [facility] ENDOSCOPY;  Service: Cardiovascular;  Laterality: N/A;   TEE WITHOUT CARDIOVERSION N/A 10/14/2022   Procedure: TRANSESOPHAGEAL ECHOCARDIOGRAM;  Surgeon: Pietro Redell RAMAN, MD;  Location: [facility] INVASIVE CV LAB;  Service: Cardiovascular;  Laterality: N/A;   TONSILLECTOMY     Patient Active Problem List   Diagnosis Date Noted   B12 deficiency 04/12/2023   Acute  ischemic stroke (HCC) 10/13/2022   Generalized weakness 10/12/2022   Stroke (cerebrum) (HCC) 10/12/2022   Stroke-like symptoms 10/11/2022   Urge urinary incontinence 07/02/2022   Seborrheic keratosis 07/02/2022   Pain of right lower extremity 12/11/2021   Nausea and vomiting in adult 12/11/2021   Elevated troponin    Change in mental status 10/24/2021   Long term current use of antithrombotics/antiplatelets 10/02/2021   Heart murmur 04/08/2021   Dizziness 04/08/2021   Long term (current) use of antithrombotics/antiplatelets  12/16/2020   Chronic constipation 12/16/2020   Colon cancer screening 12/16/2020   History of adenomatous polyp of colon 12/16/2020   Coronary artery disease of native artery of native heart with stable angina pectoris (HCC) 03/04/2020   Diabetes mellitus (HCC) 03/03/2020   Aortic atherosclerosis (HCC) 02/12/2020   Bilateral leg cramps 01/21/2020   Bilateral lower extremity edema 01/21/2020   Anxiety disorder 01/21/2020   Type 2 diabetes mellitus with hyperglycemia, with long-term current use of insulin  (HCC) 08/15/2019   Type 2 diabetes mellitus with diabetic polyneuropathy, with long-term current use of insulin  (HCC) 08/15/2019   Uncontrolled type 2 diabetes mellitus with hypoglycemia without coma (HCC) 08/15/2019   Hyperlipidemia LDL goal <70 08/15/2019   Bilateral carpal tunnel syndrome 03/27/2019   Osteoarthritis of both knees 11/27/2018   CKD (chronic kidney disease), stage II 11/27/2018   GERD (gastroesophageal reflux disease) 06/26/2018   Homonymous hemianopia, left 02/08/2018   Cerebrovascular accident (CVA) due to embolism of cerebral artery (HCC)    Diabetes mellitus with coincident hypertension (HCC)    Trigger finger, right index finger 10/17/2017   Low back pain 07/29/2017   Carotid artery stenosis 11/02/2016   Left arm weakness    Diastolic dysfunction    Hypertension with heart disease    Acute blood loss anemia    Cerebral infarction due to embolism of right middle cerebral artery (HCC)    Perirectal abscess 09/26/2015   Vitamin D  deficiency 07/23/2015   Osteopenia 02/16/2013   Ankle fracture 01/19/2013   Anemia 09/27/2011   Type II diabetes mellitus with nephropathy (HCC) 09/24/2011   Mixed hyperlipidemia 09/24/2011   Primary hypertension 09/24/2011    ONSET DATE: 05/18/2023  REFERRING DIAG: M79.605 (ICD-10-CM) - Pain of left anterior lower extremity M54.50 (ICD-10-CM) - Bilateral low back pain without sciatica, unspecified chronicity I63.9 (ICD-10-CM) -  Cerebrovascular accident (CVA), unspecified mechanism (HCC)    THERAPY DIAG:  Muscle weakness (generalized)  Other abnormalities of gait and mobility  Unsteadiness on feet  Difficulty in walking, not elsewhere classified  Other lack of coordination  Abnormal posture  Rationale for Evaluation and Treatment: Rehabilitation  SUBJECTIVE:  SUBJECTIVE STATEMENT: Nothing new, no falls. She needs to leave early due to transportation having no rides past 4pm today.  Blood glucose was 123 this morning - she does not have monitor with her today/cannot locate at current.  Pt accompanied by: self  PERTINENT HISTORY: PMH: CAD, HTN, hx of multiple CVAs, DM II, CKD II, anxiety, PAD, and GERD    history of  several small infarcts at left brain watershed areas and left cerebellum in 10/2022 concerning for cardioembolic source, and hx of punctate left cerebellar and left parietal lobe infarcts in 10/2021 likely incidental finding on MRI during encephalopathy work-up of unclear etiology, hx of right parieto-occipital infarcts in setting of progressive right MCA stenosis in 2020 likely secondary to large vessel disease source and right MCA stroke in 2018 s/p ILR without evidence of A-fib during 4 year monitoring duration   Per PCP: Patient complains of anterior left leg pain for over a week, anterior thigh to pretibial area, sometimes she feels like pain is radiating from her back down her entire her leg.  She says the pain is intermittent, and rates it as an 8/10. It occasionally wakes her up at night.   PAIN:  Are you having pain? No   There were no vitals filed for this visit.      PRECAUTIONS: Fall   FALLS: Has patient fallen in last 6 months? No  LIVING ENVIRONMENT: Lives with: lives with mom (who is  81), has aide that comes in everyday and sister lives down the street and sister comes by everyday  Aide is there M-F, sister on weekends  Lives in: House/apartment Stairs: Yes: Internal: 10 steps; railings on both side and then single railing and External: from front door there are 2 steps, back door is the garage and another 2 steps; none Has following equipment at home: Single point cane, Walker - 2 wheeled, shower chair, and Grab bars Aide makes the meals, sister sets up weekly medications and does chores  PLOF: Independent with household mobility with device, Independent with community mobility with device, and Needs assistance with homemaking No driving   PATIENT GOALS: Wants to walk better and have better balance   OBJECTIVE:  Note: Objective measures were completed at Evaluation unless otherwise noted.  DIAGNOSTIC FINDINGS: MRI brain 10/12/22: IMPRESSION: 1. Small acute infarcts scattered in the left cerebral hemisphere and left cerebellum. 2. Extensive chronic small vessel disease and right cerebral cortex infarcts.  COGNITION: Overall cognitive status: Impaired   SENSATION: Light touch: Impaired , more difficult to detect in RLE   COORDINATION: Slightly dysmetric movements in BLE    POSTURE: rounded shoulders and forward head   LOWER EXTREMITY MMT:    MMT Right Eval Left Eval  Hip flexion 4 4  Hip extension    Hip abduction 4 4  Hip adduction 4 4  Hip internal rotation    Hip external rotation    Knee flexion 4 4  Knee extension 4+ 4+  Ankle dorsiflexion 4 4  Ankle plantarflexion    Ankle inversion    Ankle eversion    (Blank rows = not tested)  All tested in sitting  Decr DF ROM in R ankle due to hx of surgery   BED MOBILITY:  Pt denies difficulty getting in and out of the bed at home   TRANSFERS: Sit to stand: SBA and CGA  Assistive device utilized: None     Stand to sit: SBA  Assistive device utilized: None  Pt needing BUE support to  come to stand and more reliant on arms, decr forward lean, one episode of CGA in standing for balance, decr eccentric control back to chair   GAIT: Findings: Gait Characteristics: step through pattern, decreased stance time- Right, decreased stance time- Left, decreased stride length, Right foot flat, Left foot flat, poor foot clearance- Right, and poor foot clearance- Left, Unsteadiness with short shuffled steps  Distance walked: Clinic distances ,  Assistive device utilized:Single point cane,  Level of assistance: CGA, and Comments: Lowered pt's cane as it was initially too high Pt intermittently hitting R foot to cane, needing CGA/min A for balance   FUNCTIONAL TESTS:  5 times sit to stand: 21.4 seconds with BUE support  Timed up and go (TUG): 31.5 seconds with SPC and CGA 10 meter walk test: 31.8 seconds = 1.03 ft/sec with SPC and CGA                                                                                                                                TREATMENT DATE: 07/25/23  -Arm bike 2x2 minutes forwards and 2x2 minutes backwards for cardiac endurance and activity tolerance on level 2.0, no seated recovery used during entirety of task today.    Circuit 1 (completed x3 w/ better initiation, but some distraction noted during tasks):   -backwards walking x30 ft SBA using rollator, cues to improve width of BOS  -Mini squats at rollator x10  -Marching x20, cued for increased amplitude and BOS  -Verified she still has current HEP - she reports compliance but does not elaborate on days/reps -Blood glucose 104 at end of session; Pt handoff to transportation at 4:06pm  PATIENT EDUCATION: Education details: Ongoing 7/8:  Continue HEP (please work towards 4 days per week, can break up into groups of exercise as needed) - additions made today recommending supervision for initial practice safety, ongoing rollator brake management particularly with sitting, alignment to surface when  going to sit.  Diaphragmatic breathing throughout activity to improve tolerance and continuity.  Discharge plan for Friday as pt rescheduled appt for tomorrow due to not having a ride. Person educated: Patient Education method: Explanation Education comprehension: verbalized understanding and needs further education  HOME EXERCISE PROGRAM: *Please print additions in largest format per pt request! Access Code: [email or web address] URL: [email or web address] Date: 07/14/2023 Prepared by: Daved Bull  Exercises - Sit to Stand with Arms Crossed  - 1 x daily - 4 x weekly - 2 sets - 8 reps - Standing March with Counter Support  - 1 x daily - 4 x weekly - 2 sets - 20 reps - Standing Hip Abduction with Counter Support  - 1 x daily - 4 x weekly - 2 sets - 8 reps - Corner Balance Feet Together With Eyes Closed  - 1 x daily - 4 x weekly - 1 sets - 3 reps - 30 seconds hold - Seated Scapular Retraction  -  1 x daily - 4 x weekly - 2 sets - 10 reps - Backward Walking with Counter Support  - 1 x daily - 4 x weekly - 3 sets - 10 reps - Walking with Head Rotation  - 1 x daily - 4 x weekly - 3 sets - 10 reps (at counter) - Walking with Head Nod  - 1 x daily - 4 x weekly - 3 sets - 10 reps (at counter)  GOALS: Goals reviewed with patient? Yes  SHORT TERM GOALS: Target date: 06/29/2023  Pt will be independent with initial HEP for strength, gait, balance in order to build upon functional gains made in therapy. Baseline: Goal status: PARTIALLY MET  2.  Pt will improve 5x sit<>stand to less than or equal to 18 sec to demonstrate improved functional strength and transfer efficiency.   Baseline: 21.4 seconds with BUE support  21 seconds with BUE support (6/16) Goal status: NOT MET  3.  Pt will improve TUG time to 25 seconds or less with LRAD in order to demo decrease fall risk.  Baseline: 31.5 seconds with SPC and CGA  27.9 seconds with rollator with supervision  Goal status: NOT MET  4.   Pt will improve gait speed with LRAD to at least 1.5 ft/sec in order to demo  decr fall risk  Baseline: 31.8 seconds = 1.03 ft/sec with SPC and CGA   22.1 seconds with with rollator = 1.47 ft/sec with supervision Goal status: MET  5.  Pt will increase BERG balance score to >/=42/56 to demonstrate improved static balance. Baseline: 37/56 (6/2)  40/56 Goal status: NOT MET   LONG TERM GOALS: Target date: 07/27/2023  Pt will be independent with final HEP for strength, gait, balance in order to build upon functional gains made in therapy. Baseline: IND (7/14) Goal status: MET  2.  Pt will increase BERG balance score to >/=47/56 to demonstrate improved static balance. Baseline: 37/56 (6/2) Goal status: INITIAL  3.  Pt will improve 5x sit<>stand to less than or equal to 15 sec to demonstrate improved functional strength and transfer efficiency.  Baseline: 21.4 seconds with BUE support Goal status: INITIAL  4.  Pt will improve gait speed with LRAD to at least 2.0 ft/sec in order to demo improved community mobility.  Baseline: 31.8 seconds = 1.03 ft/sec with SPC and CGA Goal status: INITIAL  5.  Pt will improve TUG time to 21 seconds or less with LRAD in order to demo decrease fall risk. Baseline: 31.5 seconds with SPC and CGA Goal status: INITIAL    ASSESSMENT:  CLINICAL IMPRESSION: Session limited today due to pt needing to leave early due to transportation limitations in scheduling.  She demonstrates improved CV endurance w/ arm bike tolerating higher resistance more continuously than all prior sessions.  She continues to have gait deficits w/ backwards walking today requiring cues to maintain attention on task form for safety.  She has a tendency to narrow her BOS regardless of cuing.  Pt has made modest progress over most recent sessions and PT is planning to discharge at last scheduled visit.  Will continue per POC.  OBJECTIVE IMPAIRMENTS: Abnormal gait, decreased activity  tolerance, decreased balance, decreased cognition, decreased coordination, decreased endurance, decreased knowledge of use of DME, decreased mobility, difficulty walking, decreased strength, decreased safety awareness, impaired sensation, and postural dysfunction.   ACTIVITY LIMITATIONS: carrying, lifting, bending, standing, squatting, stairs, transfers, and locomotion level  PARTICIPATION LIMITATIONS: meal prep, cleaning, medication management, driving, shopping, and  community activity  PERSONAL FACTORS: Age, Behavior pattern, Past/current experiences, Time since onset of injury/illness/exacerbation, and 3+ comorbidities: CAD, HTN, hx of multiple CVAs, DM II, CKD II, anxiety, PAD, and GERD are also affecting patient's functional outcome.   REHAB POTENTIAL: Good  CLINICAL DECISION MAKING: Evolving/moderate complexity  EVALUATION COMPLEXITY: Moderate  PLAN:  PT FREQUENCY: 2x/week  PT DURATION: 8 weeks  PLANNED INTERVENTIONS: 97164- PT Re-evaluation, 97110-Therapeutic exercises, 97530- Therapeutic activity, 97112- Neuromuscular re-education, 97535- Self Care, [id number]- Manual therapy, [phone]- Gait training, Patient/Family education, Balance training, Stair training, and DME instructions  PLAN FOR NEXT SESSION: ASSESS LTGs - D/C!  Daved KATHEE Bull, PT, DPT 07/25/2023, 4:13 PM

## 2023-07-25 NOTE — Progress Notes (Signed)
 Carelink Summary Report / Loop Recorder

## 2023-07-26 ENCOUNTER — Ambulatory Visit: Payer: Self-pay | Admitting: Physical Therapy

## 2023-07-29 ENCOUNTER — Encounter: Payer: Self-pay | Admitting: Physical Therapy

## 2023-07-29 ENCOUNTER — Ambulatory Visit: Admitting: Physical Therapy

## 2023-07-29 NOTE — Therapy (Signed)
 [facility] [address] [place], [place], [address] Phone: [phone]   Fax:  [phone]  Patient Details - DISCHARGE SUMMARY Name: Sarah [name] MRN: [id number] Date of Birth: 05/17/1945 Referring Provider:  No ref. provider found  Encounter Date: 07/29/2023  Pt called to cancel appt, she is in agreement to discharge without goal assessment as today was prior planned discharge.  Daved KATHEE Bull, PT, DPT 07/29/2023, 2:09 PM  [facility] [address] [place], [place], [address] Phone: [phone]   Fax:  [phone]

## 2023-08-08 ENCOUNTER — Ambulatory Visit (INDEPENDENT_AMBULATORY_CARE_PROVIDER_SITE_OTHER)

## 2023-08-08 DIAGNOSIS — I6340 Cerebral infarction due to embolism of unspecified cerebral artery: Secondary | ICD-10-CM | POA: Diagnosis not present

## 2023-08-09 ENCOUNTER — Ambulatory Visit: Payer: Self-pay

## 2023-08-09 LAB — CUP PACEART REMOTE DEVICE CHECK
Date Time Interrogation Session: 20250727234824
Implantable Pulse Generator Implant Date: 20240618

## 2023-08-09 NOTE — Telephone Encounter (Signed)
 Noted

## 2023-08-09 NOTE — Telephone Encounter (Signed)
 FYI Only or Action Required?: FYI only for provider.  Patient was last seen in primary care on 07/06/2023 by [place], [name], MD.  Called Nurse Triage reporting Leg Swelling and Leg Pain.  Symptoms began several days ago.  Interventions attempted: Rest, hydration, or home remedies.  Symptoms are: stable.  Triage Disposition: See PCP Within 2 Weeks (overriding See PCP When Office is Open (Within 3 Days))  Patient/caregiver understands and will follow disposition?: Yes                             Copied from CRM #[id number]. Topic: Clinical - Red Word Triage >> Aug 09, 2023  1:04 PM Jayma L wrote: Red Word that prompted transfer to Nurse Triage:   Patient called in stated she is having pain in both legs that's worsening and she's having swelling in both ankles and its also going up her legs. Theres some jerking she has had happen at night time on the left side as well that's she's noticed Reason for Disposition  [1] MILD swelling of both ankles (i.e., pedal edema) AND [2] new-onset or getting worse  Answer Assessment - Initial Assessment Questions 1. ONSET: When did the swelling start? (e.g., minutes, hours, days)     3 days 2. LOCATION: What part of the leg is swollen?  Are both legs swollen or just one leg?     Bilateral ankle swelling, pain going up and down left leg 3. SEVERITY: How bad is the swelling? (e.g., localized; mild, moderate, severe)     Localized and mild/moderate 4. REDNESS: Is there redness or signs of infection?     Denies 5. PAIN: Is the swelling painful to touch? If Yes, ask: How painful is it?   (Scale 1-10; mild, moderate or severe)     Rates pain in left leg a 6  6. FEVER: Do you have a fever? If Yes, ask: What is it, how was it measured, and when did it start?      Denies 7. CAUSE: What do you think is causing the leg swelling?     Unsure 8. MEDICAL HISTORY: Do you have a history of blood clots (e.g., DVT),  cancer, heart failure, kidney disease, or liver failure?     Denies history of blood clots 9. RECURRENT SYMPTOM: Have you had leg swelling before? If Yes, ask: When was the last time? What happened that time?     States left leg pain is recurrent, but ankle swelling is a new symptoms  10. OTHER SYMPTOMS: Do you have any other symptoms? (e.g., chest pain, difficulty breathing)     Denies chest pain, denies difficulty breathing    States provider has seen patient for left leg pain before. States was prescribed gabapentin , but is not longer taking. Patient requested appointment at least 3 days out, due to transportation. Scheduled patient for Monday afternoon. Advised patient to call back if symptoms worsen before appointment. Patient verbalized understanding.  Protocols used: Leg Swelling and Edema-A-AH

## 2023-08-15 ENCOUNTER — Encounter: Payer: Self-pay | Admitting: Family Medicine

## 2023-08-15 ENCOUNTER — Ambulatory Visit: Admitting: Family Medicine

## 2023-08-15 VITALS — BP 136/70 | HR 90 | Resp 16 | Ht 60.0 in | Wt 103.5 lb

## 2023-08-15 DIAGNOSIS — R600 Localized edema: Secondary | ICD-10-CM

## 2023-08-15 DIAGNOSIS — M79605 Pain in left leg: Secondary | ICD-10-CM | POA: Diagnosis not present

## 2023-08-15 DIAGNOSIS — N182 Chronic kidney disease, stage 2 (mild): Secondary | ICD-10-CM

## 2023-08-15 LAB — BASIC METABOLIC PANEL WITH GFR
BUN: 15 mg/dL (ref 6–23)
CO2: 28 meq/L (ref 19–32)
Calcium: 10.2 mg/dL (ref 8.4–10.5)
Chloride: 108 meq/L (ref 96–112)
Creatinine, Ser: 0.87 mg/dL (ref 0.40–1.20)
GFR: 64.89 mL/min (ref >60.00)
Glucose, Bld: 104 mg/dL — ABNORMAL HIGH (ref 70–99)
Potassium: 4 meq/L (ref 3.5–5.1)
Sodium: 142 meq/L (ref 135–145)

## 2023-08-15 LAB — CK: Total CK: 72 U/L (ref 17–177)

## 2023-08-15 LAB — MAGNESIUM: Magnesium: 2.1 mg/dL (ref 1.5–2.5)

## 2023-08-15 NOTE — Progress Notes (Signed)
 ACUTE VISIT Chief Complaint  Patient presents with   Edema   HPI: Sarah [name] is a 78 y.o. female with a PMHx significant for CAD, HTN, CVA, DM II, CKD II, anxiety, PAD, and GERD, who is here today complaining of recent recurrence of lower extremity edema and bilateral calf pain.   She was previously seen in 10/2021 for leg pain; was started on Gabapentin  and Tramadol  at the time. On 12/2021 she had an MRI for RLE pain for results. More recently, she has been seen on 5/2 for neck pain and by PT on 07/14/23. Followed up with nephrology on 07/26/23.   BLE pain: She complains of bilateral calf pain ongoing 1 week.  She note this has occurred in the past, but fully resolved until it recently occurred.  Started Gabapentin , but did not feel clinical benefits.  Rates her pain a 7/10; L>R Her leg pain radiates up and leg. Reports sleep disturbances due to severe pain at nighttime States she is uncomfortable at nighttime.  She has seen PT twice weekly with improvements to pain; last seen on 07/14/23.  She was previously referred to ortho, but was not contacted and did not establish with orthopedics.   Denies any numbness and tingling    BLE edema: She also complains of bilateral ankle edema.  This occurs intermittently with acute onset 1 week ago.  Currently on Lasix  20 mg once daily prn.  She has been limiting her salt intake and uses substitute salt  She was last seen by nephrology on 7/15 and did not have any lower extremity edema at the time.  Reports nocturia; having to get up about 2-3 times per night.  Occasional foam in the urine.  No blood in the urine,.   MRI head -- 10/23/2021 IMPRESSION: 1. 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 2. No acute intracranial hemorrhage or mass effect. 3. Chronic atrophy and small vessel ischemic changes.  MRI lumbar - 12/24/2021 1. No  acute findings or clear explanation for the patient's symptoms. 2. Mild multilevel spondylosis as described. At L3-4, there is mild narrowing of the lateral recesses and right foramen with potential extraforaminal right L3 nerve root encroachment. No high-grade spinal stenosis or definite nerve root displacement.   Headache: She also reports having a headache today, similar to her typical headaches.  She is established with neurology and follows up regularly.  Of note, pt has hx of strokes.    Review of Systems See other pertinent positives and negatives in HPI.  Current Outpatient Medications on File Prior to Visit  Medication Sig Dispense Refill   amLODipine  (NORVASC ) 5 MG tablet Take 1 tablet (5 mg total) by mouth daily. 90 tablet 3   aspirin  EC 81 MG tablet Take 1 tablet (81 mg total) by mouth daily. Swallow whole. 30 tablet 12   cholecalciferol (VITAMIN D3) 25 MCG (1000 UNIT) tablet Take 1,000 Units by mouth daily.     clopidogrel  (PLAVIX ) 75 MG tablet TAKE 1 TABLET BY MOUTH DAILY 90 tablet 1   Continuous Glucose Receiver (DEXCOM G7 RECEIVER) DEVI USE TO MONITOR GLUCOSE 1 each 0   Continuous Glucose Sensor (DEXCOM G7 SENSOR) MISC USE AS DIRECTED 9 each 3   dapagliflozin  propanediol (FARXIGA ) 10 MG TABS tablet TAKE 1 TABLET BY MOUTH DAILY 90 tablet 1   Dulaglutide  (TRULICITY ) 0.75 MG/0.5ML SOAJ Inject 0.75 mg into the skin once a week. 6 mL  3   ezetimibe  (ZETIA ) 10 MG tablet Take 1 tablet (10 mg total) by mouth daily. 90 tablet 3   furosemide  (LASIX ) 20 MG tablet Take 1 tablet by mouth daily (only as needed) 90 tablet 3   insulin  glargine, 1 Unit Dial , (TOUJEO  SOLOSTAR) 300 UNIT/ML Solostar Pen Inject 14 Units into the skin daily in the afternoon. 15 mL 3   insulin  lispro (HUMALOG  KWIKPEN) 100 UNIT/ML KwikPen Max daily 30 units (Patient taking differently: Inject 6 Units into the skin 3 (three) times daily. Max daily 30 units) 30 mL 3   latanoprost  (XALATAN ) 0.005 % ophthalmic  solution Place 1 drop into both eyes at bedtime.     losartan  (COZAAR ) 50 MG tablet TAKE 1 TABLET BY MOUTH DAILY 90 tablet 3   Magnesium 250 MG TABS Take 250 mg by mouth daily.     nitroGLYCERIN  (NITROSTAT ) 0.4 MG SL tablet Place 1 tablet (0.4 mg total) under the tongue every 5 (five) minutes as needed for chest pain. one tab every 5 minutes up to 3 tablets total over 15 minutes. 25 tablet 3   ondansetron  (ZOFRAN ) 4 MG tablet Take 1 tablet (4 mg total) by mouth 2 (two) times daily as needed for nausea or vomiting. 30 tablet 1   rosuvastatin  (CRESTOR ) 20 MG tablet TAKE 1 TABLET BY MOUTH DAILY 90 tablet 3   sertraline  (ZOLOFT ) 50 MG tablet TAKE 1 TABLET BY MOUTH DAILY 90 tablet 3   topiramate  (TOPAMAX ) 50 MG tablet Take 1 tablet (50 mg total) by mouth at bedtime. 30 tablet 5   No current facility-administered medications on file prior to visit.    Past Medical History:  Diagnosis Date   Bilateral carpal tunnel syndrome 03/27/2019   Boils    Glaucoma    Heart murmur    HTN (hypertension)    Hx of adenomatous colonic polyps    Hyperlipidemia    Osteoporosis    Pneumonia    Stroke (HCC)    Type II or unspecified type diabetes mellitus without mention of complication, uncontrolled    Allergies  Allergen Reactions   Penicillins Other (See Comments)    Does not know reaction    Social History   Socioeconomic History   Marital status: Single    Spouse name: Not on file   Number of children: Not on file   Years of education: Not on file   Highest education level: Associate degree: occupational, Scientist, product/process development, or vocational program  Occupational History   Not on file  Tobacco Use   Smoking status: Former    Current packs/day: 0.00    Types: Cigarettes    Quit date: 10/15/2016    Years since quitting: 6.8   Smokeless tobacco: Never   Tobacco comments:    smokes occ.   Vaping Use   Vaping status: Never Used  Substance and Sexual Activity   Alcohol  use: Not Currently    Comment:  occ   Drug use: No   Sexual activity: Not on file  Other Topics Concern   Not on file  Social History Narrative   Caffiene coffee 1 cup in am.   Retired from [employer].     Social Drivers of Corporate investment banker Strain: Low Risk  (05/16/2023)   Overall Financial Resource Strain (CARDIA)    Difficulty of Paying Living Expenses: Not hard at all  Food Insecurity: No Food Insecurity (05/16/2023)   Hunger Vital Sign    Worried About Running Out of Food  in the Last Year: Never true    Ran Out of Food in the Last Year: Never true  Transportation Needs: No Transportation Needs (05/16/2023)   PRAPARE - Administrator, Civil Service (Medical): No    Lack of Transportation (Non-Medical): No  Physical Activity: Insufficiently Active (05/16/2023)   Exercise Vital Sign    Days of Exercise per Week: 2 days    Minutes of Exercise per Session: 60 min  Stress: No Stress Concern Present (05/16/2023)   [employer] of Occupational Health - Occupational Stress Questionnaire    Feeling of Stress : Not at all  Recent Concern: Stress - Stress Concern Present (05/09/2023)   [employer] of Occupational Health - Occupational Stress Questionnaire    Feeling of Stress : To some extent  Social Connections: Socially Isolated (05/16/2023)   Social Connection and Isolation Panel    Frequency of Communication with Friends and Family: More than three times a week    Frequency of Social Gatherings with Friends and Family: More than three times a week    Attends Religious Services: Never    Database administrator or Organizations: No    Attends Banker Meetings: Never    Marital Status: Widowed    Vitals:   08/15/23 1342  BP: 136/70  Pulse: 90  SpO2: 98%   Body mass index is 20.21 kg/m.  Physical Exam Musculoskeletal:     Comments: Mild left side lower back pain; L>R Pain to palpation on bilateral calves; L>R     ASSESSMENT AND PLAN: Sarah [name] was seen today for ankle swelling and let leg pain.  There are no diagnoses linked to this encounter.  No follow-ups on file.  I, Sarah [name], acting as a scribe for Sarah Benzel [place], MD., have documented all relevant documentation on the behalf of Sarah Tukes [place], MD, as directed by   while in the presence of Sarah Husak [place], MD.  I, Sarah Salvaggio [place], MD, have reviewed all documentation for this visit. The documentation on 08/15/23 for the exam, diagnosis, procedures, and orders are all accurate and complete.  Sarah Dipalma G. [place], MD  [facility]. [place] office.  Discharge Instructions   None

## 2023-08-15 NOTE — Patient Instructions (Addendum)
 A few things to remember from today's visit:  Bilateral lower extremity edema - Plan: Basic metabolic panel with GFR, Magnesium, CK  Pain of left lower extremity - Plan: VAS US  ABI WITH/WO TBI, Basic metabolic panel with GFR, Magnesium, CK  Hold on Rosuvastatin  for 2-3 weeks and let me know if leg pain changes.If pain is caused by med we need to try a different cholesterol medication.  Continue Furosemide . Legs elevation and compression stockings. Be sure you ear adequate amount of protein.  If you need refills for medications you take chronically, please call your pharmacy. Do not use My Chart to request refills or for acute issues that need immediate attention. If you send a my chart message, it may take a few days to be addressed, specially if I am not in the office.  Please be sure medication list is accurate. If a new problem present, please set up appointment sooner than planned today.

## 2023-08-16 ENCOUNTER — Telehealth: Payer: Self-pay

## 2023-08-16 NOTE — Telephone Encounter (Signed)
 Copied from CRM #[id number]. Topic: Referral - Question >> Aug 16, 2023 12:59 PM Lavanda D wrote: Reason for CRM: Patient is calling to make sure that Dr. [place] is sending in her orthopedics referral, she said that they had spoken about that at her recent appointment and she said she would send it.

## 2023-08-17 ENCOUNTER — Encounter: Payer: Self-pay | Admitting: Family Medicine

## 2023-08-17 ENCOUNTER — Ambulatory Visit: Payer: Self-pay | Admitting: Family Medicine

## 2023-08-17 NOTE — Assessment & Plan Note (Signed)
 Continue adequate hydration, low-salt diet, and avoidance of NSAIDs. Following with nephrology regularly.

## 2023-08-17 NOTE — Assessment & Plan Note (Signed)
 Problem has been intermittent for some time. Hx of proteinuria, venous insufficiency could also be a contributing factor. She is on Furosemide  20 mg daily, no changes today. Amlodipine  could also be aggravating problem. Recommend LE elevation above heart level a few times per day and compression stockings. Good skin care to continue.

## 2023-08-22 ENCOUNTER — Ambulatory Visit: Payer: Self-pay | Admitting: Cardiovascular Disease

## 2023-08-23 ENCOUNTER — Telehealth (HOSPITAL_COMMUNITY): Payer: Self-pay

## 2023-08-23 NOTE — Telephone Encounter (Signed)
 Attempted to contact the patient to schedule VAS [place].  No answer.  Left message.  Second Attempt. Provided direct contact number for scheduling: [phone].

## 2023-08-25 ENCOUNTER — Other Ambulatory Visit (HOSPITAL_COMMUNITY): Payer: Self-pay

## 2023-08-25 ENCOUNTER — Telehealth: Payer: Self-pay | Admitting: Pharmacy Technician

## 2023-08-25 NOTE — Telephone Encounter (Signed)
 Dexcom order has been sent to [facility] through [facility]

## 2023-08-25 NOTE — Telephone Encounter (Signed)
 Pharmacy Patient Advocate Encounter   Received notification from CoverMyMeds that prior authorization for Dexcom is required/requested.   Insurance verification completed.   The patient is insured through [employer] .   Per test claim: The current 90 day co-pay is, $0.  No PA needed at this time. This test claim was processed through [facility]- copay amounts may vary at other pharmacies due to pharmacy/plan contracts, or as the patient moves through the different stages of their insurance plan.     Looks like it has to bill through part B. Not sure if [facility] can still fill it that way or not. You may have to send the orders to another company.

## 2023-08-30 ENCOUNTER — Other Ambulatory Visit: Payer: Self-pay | Admitting: Family Medicine

## 2023-08-30 ENCOUNTER — Ambulatory Visit (HOSPITAL_COMMUNITY)
Admission: RE | Admit: 2023-08-30 | Discharge: 2023-08-30 | Disposition: A | Discharge status: Home / Self Care | Source: Ambulatory Visit | Attending: Family Medicine | Admitting: Family Medicine

## 2023-08-30 DIAGNOSIS — I119 Hypertensive heart disease without heart failure: Secondary | ICD-10-CM

## 2023-08-30 DIAGNOSIS — M79605 Pain in left leg: Secondary | ICD-10-CM | POA: Diagnosis not present

## 2023-08-30 DIAGNOSIS — E782 Mixed hyperlipidemia: Secondary | ICD-10-CM

## 2023-08-31 LAB — VAS US ABI WITH/WO TBI: Right ABI: 1.32

## 2023-09-02 ENCOUNTER — Ambulatory Visit: Admitting: Family

## 2023-09-04 ENCOUNTER — Other Ambulatory Visit: Payer: Self-pay | Admitting: Adult Health

## 2023-09-08 ENCOUNTER — Ambulatory Visit

## 2023-09-08 DIAGNOSIS — I6340 Cerebral infarction due to embolism of unspecified cerebral artery: Secondary | ICD-10-CM

## 2023-09-08 LAB — CUP PACEART REMOTE DEVICE CHECK
Date Time Interrogation Session: 20250827233641
Implantable Pulse Generator Implant Date: 20240618

## 2023-09-13 ENCOUNTER — Other Ambulatory Visit: Payer: Self-pay

## 2023-09-13 ENCOUNTER — Observation Stay (HOSPITAL_COMMUNITY)
Admission: EM | Admit: 2023-09-13 | Discharge: 2023-09-16 | Disposition: A | Discharge status: Home Health | Attending: Internal Medicine | Admitting: Internal Medicine

## 2023-09-13 ENCOUNTER — Ambulatory Visit: Admitting: Family Medicine

## 2023-09-13 ENCOUNTER — Emergency Department (HOSPITAL_COMMUNITY)

## 2023-09-13 ENCOUNTER — Emergency Department (HOSPITAL_COMMUNITY): Payer: Self-pay

## 2023-09-13 DIAGNOSIS — R2689 Other abnormalities of gait and mobility: Secondary | ICD-10-CM | POA: Insufficient documentation

## 2023-09-13 DIAGNOSIS — Z8673 Personal history of transient ischemic attack (TIA), and cerebral infarction without residual deficits: Secondary | ICD-10-CM | POA: Diagnosis not present

## 2023-09-13 DIAGNOSIS — Z794 Long term (current) use of insulin: Secondary | ICD-10-CM | POA: Insufficient documentation

## 2023-09-13 DIAGNOSIS — R2681 Unsteadiness on feet: Secondary | ICD-10-CM | POA: Insufficient documentation

## 2023-09-13 DIAGNOSIS — N182 Chronic kidney disease, stage 2 (mild): Secondary | ICD-10-CM | POA: Insufficient documentation

## 2023-09-13 DIAGNOSIS — W108XXA Fall (on) (from) other stairs and steps, initial encounter: Secondary | ICD-10-CM | POA: Diagnosis not present

## 2023-09-13 DIAGNOSIS — E1122 Type 2 diabetes mellitus with diabetic chronic kidney disease: Secondary | ICD-10-CM | POA: Insufficient documentation

## 2023-09-13 DIAGNOSIS — R41841 Cognitive communication deficit: Secondary | ICD-10-CM | POA: Diagnosis not present

## 2023-09-13 DIAGNOSIS — I779 Disorder of arteries and arterioles, unspecified: Secondary | ICD-10-CM | POA: Insufficient documentation

## 2023-09-13 DIAGNOSIS — Z9181 History of falling: Secondary | ICD-10-CM | POA: Insufficient documentation

## 2023-09-13 DIAGNOSIS — W19XXXA Unspecified fall, initial encounter: Secondary | ICD-10-CM

## 2023-09-13 DIAGNOSIS — I129 Hypertensive chronic kidney disease with stage 1 through stage 4 chronic kidney disease, or unspecified chronic kidney disease: Secondary | ICD-10-CM | POA: Insufficient documentation

## 2023-09-13 DIAGNOSIS — D649 Anemia, unspecified: Secondary | ICD-10-CM | POA: Diagnosis not present

## 2023-09-13 DIAGNOSIS — M6281 Muscle weakness (generalized): Secondary | ICD-10-CM | POA: Diagnosis not present

## 2023-09-13 DIAGNOSIS — S060XAA Concussion with loss of consciousness status unknown, initial encounter: Secondary | ICD-10-CM | POA: Diagnosis present

## 2023-09-13 DIAGNOSIS — E785 Hyperlipidemia, unspecified: Secondary | ICD-10-CM | POA: Diagnosis not present

## 2023-09-13 DIAGNOSIS — I2510 Atherosclerotic heart disease of native coronary artery without angina pectoris: Secondary | ICD-10-CM | POA: Diagnosis not present

## 2023-09-13 DIAGNOSIS — Z79899 Other long term (current) drug therapy: Secondary | ICD-10-CM | POA: Insufficient documentation

## 2023-09-13 DIAGNOSIS — I10 Essential (primary) hypertension: Secondary | ICD-10-CM | POA: Insufficient documentation

## 2023-09-13 DIAGNOSIS — E119 Type 2 diabetes mellitus without complications: Secondary | ICD-10-CM

## 2023-09-13 LAB — POCT I-STAT, CHEM 8
BUN: 20 mg/dL (ref 8–23)
Calcium, Ion: 1.29 mmol/L (ref 1.15–1.40)
Chloride: 111 mmol/L (ref 98–111)
Creatinine, Ser: 1 mg/dL (ref 0.44–1.00)
Glucose, Bld: 68 mg/dL — ABNORMAL LOW (ref 70–99)
HCT: 32 % — ABNORMAL LOW (ref 36.0–46.0)
Hemoglobin: 10.9 g/dL — ABNORMAL LOW (ref 12.0–15.0)
Potassium: 4.1 mmol/L (ref 3.5–5.1)
Sodium: 142 mmol/L (ref 135–145)
TCO2: 22 mmol/L (ref 22–32)

## 2023-09-13 LAB — URINALYSIS, ROUTINE W REFLEX MICROSCOPIC
Bilirubin Urine: NEGATIVE
Glucose, UA: >=500 mg/dL — AB
Hgb urine dipstick: NEGATIVE
Ketones, ur: NEGATIVE mg/dL
Leukocytes,Ua: NEGATIVE
Nitrite: NEGATIVE
Protein, ur: >=300 mg/dL — AB
Specific Gravity, Urine: 1.02 (ref 1.005–1.030)
pH: 8 (ref 5.0–8.0)

## 2023-09-13 LAB — GLUCOSE, CAPILLARY
Glucose-Capillary: 93 mg/dL (ref 70–99)
Glucose-Capillary: 88 mg/dL (ref 70–99)
Glucose-Capillary: 160 mg/dL — ABNORMAL HIGH (ref 70–99)

## 2023-09-13 LAB — I-STAT CHEM 8, ED
BUN: 20 mg/dL (ref 8–23)
Calcium, Ion: 1.29 mmol/L (ref 1.15–1.40)
Chloride: 111 mmol/L (ref 98–111)
Creatinine, Ser: 1 mg/dL (ref 0.61–1.24)
Glucose, Bld: 68 mg/dL — ABNORMAL LOW (ref 70–99)
HCT: 32 % — ABNORMAL LOW (ref 39.0–52.0)
Hemoglobin: 10.9 g/dL — ABNORMAL LOW (ref 13.0–17.0)
Potassium: 4.1 mmol/L (ref 3.5–5.1)
Sodium: 142 mmol/L (ref 135–145)
TCO2: 22 mmol/L (ref 22–32)

## 2023-09-13 LAB — PROTIME-INR
INR: 1.1 (ref 0.8–1.2)
Prothrombin Time: 15 s (ref 11.4–15.2)

## 2023-09-13 LAB — SAMPLE TO BLOOD BANK

## 2023-09-13 LAB — ETHANOL: Alcohol, Ethyl (B): <15 mg/dL (ref <15)

## 2023-09-13 LAB — CBC
HCT: 35 % — ABNORMAL LOW (ref 36.0–46.0)
Hemoglobin: 10.6 g/dL — ABNORMAL LOW (ref 12.0–15.0)
MCH: 31.4 pg (ref 26.0–34.0)
MCHC: 30.3 g/dL (ref 30.0–36.0)
MCV: 103.6 fL — ABNORMAL HIGH (ref 80.0–100.0)
Platelets: 300 K/uL (ref 150–400)
RBC: 3.38 MIL/uL — ABNORMAL LOW (ref 3.87–5.11)
RDW: 13.1 % (ref 11.5–15.5)
WBC: 7.3 K/uL (ref 4.0–10.5)
nRBC: 0 % (ref 0.0–0.2)

## 2023-09-13 LAB — I-STAT CG4 LACTIC ACID, ED: Lactic Acid, Venous: 1.5 mmol/L (ref 0.5–1.9)

## 2023-09-13 MED ORDER — ROSUVASTATIN CALCIUM 20 MG PO TABS
20.0000 mg | ORAL_TABLET | Freq: Every day | ORAL | Status: DC
  Administered 2023-09-13 – 2023-09-15 (×3): 20 mg via ORAL
  Filled 2023-09-13 (×3): qty 1

## 2023-09-13 MED ORDER — EZETIMIBE 10 MG PO TABS
10.0000 mg | ORAL_TABLET | Freq: Every day | ORAL | Status: DC
  Administered 2023-09-14 – 2023-09-16 (×3): 10 mg via ORAL
  Filled 2023-09-13 (×3): qty 1

## 2023-09-13 MED ORDER — SODIUM CHLORIDE 0.9% FLUSH
3.0000 mL | Freq: Two times a day (BID) | INTRAVENOUS | Status: DC
  Administered 2023-09-13 – 2023-09-16 (×7): 3 mL via INTRAVENOUS

## 2023-09-13 MED ORDER — ACETAMINOPHEN 650 MG RE SUPP: 650.0000 mg | Freq: Four times a day (QID) | RECTAL | Status: DC | PRN

## 2023-09-13 MED ORDER — INSULIN ASPART 100 UNIT/ML IJ SOLN
0.0000 [IU] | Freq: Three times a day (TID) | INTRAMUSCULAR | Status: DC
  Administered 2023-09-14: 5 [IU] via SUBCUTANEOUS
  Administered 2023-09-15: 2 [IU] via SUBCUTANEOUS
  Administered 2023-09-16: 5 [IU] via SUBCUTANEOUS

## 2023-09-13 MED ORDER — AMLODIPINE BESYLATE 5 MG PO TABS
5.0000 mg | ORAL_TABLET | Freq: Every day | ORAL | Status: DC
  Administered 2023-09-14 – 2023-09-16 (×3): 5 mg via ORAL
  Filled 2023-09-13 (×3): qty 1

## 2023-09-13 MED ORDER — LATANOPROST 0.005 % OP SOLN
1.0000 [drp] | Freq: Every day | OPHTHALMIC | Status: DC
  Administered 2023-09-13 – 2023-09-15 (×3): 1 [drp] via OPHTHALMIC
  Filled 2023-09-13 (×2): qty 2.5

## 2023-09-13 MED ORDER — SERTRALINE HCL 50 MG PO TABS
50.0000 mg | ORAL_TABLET | Freq: Every day | ORAL | Status: DC
  Administered 2023-09-14 – 2023-09-16 (×3): 50 mg via ORAL
  Filled 2023-09-13 (×3): qty 1

## 2023-09-13 MED ORDER — IOHEXOL 350 MG/ML SOLN
75.0000 mL | Freq: Once | INTRAVENOUS | Status: AC | PRN
  Administered 2023-09-13: 75 mL via INTRAVENOUS

## 2023-09-13 MED ORDER — POLYETHYLENE GLYCOL 3350 17 G PO PACK: 17.0000 g | PACK | Freq: Every day | ORAL | Status: DC | PRN

## 2023-09-13 MED ORDER — TOPIRAMATE 25 MG PO TABS
25.0000 mg | ORAL_TABLET | Freq: Every day | ORAL | Status: DC
  Administered 2023-09-13 – 2023-09-15 (×3): 25 mg via ORAL
  Filled 2023-09-13 (×3): qty 1

## 2023-09-13 MED ORDER — ACETAMINOPHEN 325 MG PO TABS
650.0000 mg | ORAL_TABLET | Freq: Four times a day (QID) | ORAL | Status: DC | PRN
  Administered 2023-09-13 – 2023-09-14 (×2): 650 mg via ORAL
  Filled 2023-09-13 (×2): qty 2

## 2023-09-13 MED ORDER — SODIUM CHLORIDE 0.9 % IV BOLUS
1000.0000 mL | Freq: Once | INTRAVENOUS | Status: AC
  Administered 2023-09-13: 1000 mL via INTRAVENOUS

## 2023-09-13 MED ORDER — LOSARTAN POTASSIUM 50 MG PO TABS
50.0000 mg | ORAL_TABLET | Freq: Every day | ORAL | Status: DC
Start: 2023-09-14 — End: 2023-09-17
  Administered 2023-09-14 – 2023-09-16 (×3): 50 mg via ORAL
  Filled 2023-09-13 (×3): qty 1

## 2023-09-13 NOTE — ED Notes (Signed)
 Patient transported to CT

## 2023-09-13 NOTE — ED Notes (Signed)
 NT assisted pt with using bedpan sample was taken and sent down to lab

## 2023-09-13 NOTE — H&P (Signed)
 History and Physical   MARK BENECKE FMW:[id number] DOB: August 08, 1945 DOA: 09/13/2023  PCP: [place], [name], MD   Patient coming from: Home  Chief Complaint: Fall  HPI: Sarah [name] is a 78 y.o. female with medical history significant of hypertension, hyperlipidemia, diabetes, CKD 2, CVA, carotid artery disease, CAD, GERD, anemia, anxiety presenting after a fall downstairs at home.  Patient was walking down the stairs at home and lost her balance and fell.  EMS was called on their arrival they noted the staircase at 16 steps but unclear how many she fell down.  Patient is on Plavix  for history of CVA.  EMS reported patient was initially hypotensive but unclear if this was a spurious reading as blood pressure was normal in the ED.  Patient denies fevers, chills, chest pain, shortness of breath, abdominal pain, constipation, diarrhea, nausea, vomiting.    ED Course: Vital signs in the ED notable for blood pressure in the 140s-150s systolic.  CMP is pending.  CBC with hemoglobin stable at 10.6.  PT and INR normal.  Lactic acid normal.  Ethanol negative.  Urinalysis pending.  Trauma imaging included chest x-ray with low lung volumes with no acute Sharol.  Pelvis x-ray with no acute abnormality.  CT head with no acute intracranial abnormality but scalp hematoma noted.  CT C-spine with no acute abnormality but chronic cervical spine disease noted.  CT of the chest abdomen pelvis with no acute abnormality.  Patient seen 1 L IV fluids in the ED.  Trauma surgery was consulted and recommended observation and resumption of Plavix  tomorrow if platelets and hemoglobin are stable.  Review of Systems: As per HPI otherwise all other systems reviewed and are negative.  No past medical history on file.  Patient's past history are blank and are awaiting merge with prior chart as patient arrived as a trauma and new chart was created due to this.  Social History  has no history on file for  tobacco use, alcohol  use, and drug use.  Not on File  No family history on file.  Prior to Admission medications   Medication Sig Start Date End Date Taking? Authorizing Provider  amLODipine  (NORVASC ) 5 MG tablet Take 5 mg by mouth daily. 08/02/23  Yes [provider]  clopidogrel  (PLAVIX ) 75 MG tablet Take 75 mg by mouth daily. 06/29/23  Yes [provider]  ezetimibe  (ZETIA ) 10 MG tablet Take 10 mg by mouth daily. 08/31/23  Yes [provider]  FARXIGA  10 MG TABS tablet Take 10 mg by mouth daily. 09/08/23  Yes [provider]  gabapentin  (NEURONTIN ) 100 MG capsule Take 200 mg by mouth at bedtime. 06/09/23  Yes [provider]  latanoprost  (XALATAN ) 0.005 % ophthalmic solution SMARTSIG:In Eye(s) 06/13/23  Yes [provider]  losartan  (COZAAR ) 50 MG tablet Take 50 mg by mouth daily. 08/31/23  Yes [provider]  ondansetron  (ZOFRAN ) 4 MG tablet Take 4 mg by mouth 2 (two) times daily as needed. 06/10/23  Yes [provider]  rosuvastatin  (CRESTOR ) 20 MG tablet Take 20 mg by mouth at bedtime. 05/16/23  Yes [provider]  sertraline  (ZOLOFT ) 50 MG tablet Take 50 mg by mouth daily. 08/23/23  Yes [provider]  topiramate  (TOPAMAX ) 25 MG tablet Take 25 mg by mouth at bedtime. 07/31/23  Yes [provider]  TRULICITY  1.5 MG/0.5ML SOAJ Inject 1.5 mg into the skin once a week. 06/29/23  Yes [provider]    Physical Exam: Vitals:  09/13/23 1325 09/13/23 1330 09/13/23 1350 09/13/23 1415  BP:    (!) 153/65  Pulse: 79 80  78  Resp: 18 17  19   Temp:      TempSrc:      SpO2: 100% 100% 100% 100%  Weight:      Height:        Physical Exam Constitutional:      General: She is not in acute distress.    Appearance: Normal appearance.  HENT:     Head:     Comments: Hematoma left frontal scalp.    Mouth/Throat:     Mouth: Mucous membranes are moist.     Pharynx: Oropharynx is clear.  Eyes:      Extraocular Movements: Extraocular movements intact.     Pupils: Pupils are equal, round, and reactive to light.  Cardiovascular:     Rate and Rhythm: Normal rate and regular rhythm.     Pulses: Normal pulses.     Heart sounds: Normal heart sounds.  Pulmonary:     Effort: Pulmonary effort is normal. No respiratory distress.     Breath sounds: Normal breath sounds.  Abdominal:     General: Bowel sounds are normal. There is no distension.     Palpations: Abdomen is soft.     Tenderness: There is no abdominal tenderness.  Musculoskeletal:        General: No swelling or deformity.  Skin:    General: Skin is warm and dry.  Neurological:     General: No focal deficit present.     Comments: Some loss of memory of the events surrounding the fall.  Was noted to be repeating herself some while in the ED per chart review.    Labs on Admission: I have personally reviewed following labs and imaging studies  CBC: Recent Labs  Lab 09/13/23 1227 09/13/23 1241  WBC 7.3  --   HGB 10.6* 10.9*  HCT 35.0* 32.0*  MCV 103.6*  --   PLT 300  --     Basic Metabolic Panel: Recent Labs  Lab 09/13/23 1241  NA 142  K 4.1  CL 111  GLUCOSE 68*  BUN 20  CREATININE 1.00    GFR: Estimated Creatinine Clearance: 41.7 mL/min (by C-G formula based on SCr of 1 mg/dL).  Liver Function Tests: No results for input(s): AST, ALT, ALKPHOS, BILITOT, PROT, ALBUMIN in the last 168 hours.  Urine analysis: No results found for: COLORURINE, APPEARANCEUR, LABSPEC, PHURINE, GLUCOSEU, HGBUR, BILIRUBINUR, KETONESUR, PROTEINUR, UROBILINOGEN, NITRITE, LEUKOCYTESUR  Radiological Exams on Admission: DG Pelvis Portable Result Date: 09/13/2023 CLINICAL DATA:  Trauma EXAM: PORTABLE PELVIS 1-2 VIEWS COMPARISON:  November 19, 2021 FINDINGS: No evidence of pelvic fracture or diastasis.No acute hip fracture or dislocation.There is no evidence of arthropathy or other focal bone  abnormality.Soft tissues are unremarkable. IMPRESSION: No acute fracture, pelvic bone diastasis, or dislocation. Electronically Signed   By: Rogelia Myers M.D.   On: 09/13/2023 13:15   DG Chest Port 1 View Result Date: 09/13/2023 CLINICAL DATA:  Trauma EXAM: PORTABLE CHEST - 1 VIEW COMPARISON:  None available. FINDINGS: Low lung volumes. No focal airspace consolidation, pleural effusion, or pneumothorax. No cardiomegaly. Tortuous aorta. No acute fracture or destructive lesions. Multilevel thoracic osteophytosis. Cardiac loop recorder device along the left chest wall. IMPRESSION: Low lung volumes.  Otherwise, no acute cardiopulmonary abnormality. Electronically Signed   By: Rogelia Myers M.D.   On: 09/13/2023 13:08   CT CHEST ABDOMEN PELVIS W CONTRAST Result Date: 09/13/2023  CLINICAL DATA:  Polytrauma, blunt, fall EXAM: CT CHEST, ABDOMEN, AND PELVIS WITH CONTRAST TECHNIQUE: Multidetector CT imaging of the chest, abdomen and pelvis was performed following the standard protocol during bolus administration of intravenous contrast. RADIATION DOSE REDUCTION: This exam was performed according to the departmental dose-optimization program which includes automated exposure control, adjustment of the mA and/or kV according to patient size and/or use of iterative reconstruction technique. CONTRAST:  75mL OMNIPAQUE  IOHEXOL  350 MG/ML SOLN COMPARISON:  09/13/2023, 06/10/2023, 11/19/2021 FINDINGS: CT CHEST FINDINGS Pulmonary Embolism: While the exam was not optimized for the evaluation of the pulmonary arteries, no central pulmonary embolism visualized. Cardiovascular: No cardiomegaly or pericardial effusion.No aortic aneurysm. Multi-vessel coronary and diffuse aortic atherosclerosis. Mediastinum/Nodes: No mediastinal mass.No mediastinal, hilar, or axillary lymphadenopathy. Lungs/Pleura: The midline trachea and bronchi are patent. No focal airspace consolidation, pleural effusion, or pneumothorax. Posterior bibasilar  dependent atelectasis. CT ABDOMEN PELVIS FINDINGS Hepatobiliary: No mass.Focal fatty infiltration along the falciform ligament.No radiopaque stones or wall thickening of the gallbladder. No intrahepatic or extrahepatic biliary ductal dilation. The portal veins are patent. Pancreas: No mass or main ductal dilation. No peripancreatic inflammation or fluid collection. Spleen: Normal size. No mass. Adrenals/Urinary Tract: No adrenal masses. No renal mass. No nephrolithiasis or hydronephrosis. The urinary bladder is distended without focal abnormality. Stomach/Bowel: The stomach is decompressed without focal abnormality. No small bowel wall thickening or inflammation. No small bowel obstruction.Normal appendix. Moderate volume fecal loading throughout the colon. Scattered colonic diverticulosis. Vascular/Lymphatic: No aortic aneurysm. Diffuse aortoiliac atherosclerosis. No intraabdominal or pelvic lymphadenopathy. Reproductive: Hysterectomy. No concerning adnexal mass.No free pelvic fluid. Other: No pneumoperitoneum, ascites, or mesenteric inflammation. Musculoskeletal: No acute fracture or destructive lesion. Diffuse osteopenia. Multilevel degenerative disc disease of the spine. Cardiac loop recorder device in the anterior left chest. IMPRESSION: No acute, traumatic injury within the chest, abdomen, and pelvis. Electronically Signed   By: Rogelia Myers M.D.   On: 09/13/2023 13:06   CT HEAD WO CONTRAST Result Date: 09/13/2023 CLINICAL DATA:  Provided history: Polytrauma, blunt. EXAM: CT HEAD WITHOUT CONTRAST CT CERVICAL SPINE WITHOUT CONTRAST TECHNIQUE: Multidetector CT imaging of the head and cervical spine was performed following the standard protocol without intravenous contrast. Multiplanar CT image reconstructions of the cervical spine were also generated. RADIATION DOSE REDUCTION: This exam was performed according to the departmental dose-optimization program which includes automated exposure control,  adjustment of the mA and/or kV according to patient size and/or use of iterative reconstruction technique. COMPARISON:  Brain MRI 03/30/2023. Non-contrast head CT and CT angiogram head/neck 10/12/2022. Head FINDINGS: CT HEAD FINDINGS Brain: Mild generalized cerebral atrophy. Known chronic right MCA territory infarcts within the temporal and parietal lobes. Small chronic infarcts also again demonstrated within the bilateral cerebral hemispheric white matter and cerebellar hemispheres. Patchy and ill-defined hypoattenuation elsewhere in the cerebral white matter, nonspecific but compatible with advanced chronic small vessel ischemic disease. There is no acute intracranial hemorrhage. No acute demarcated cortical infarct. No extra-axial fluid collection. No evidence of an intracranial mass. No midline shift. Vascular: No hyperdense vessel.  Atherosclerotic calcifications. Skull: No calvarial fracture or aggressive osseous lesion. Sinuses/Orbits: No mass or acute finding within the imaged orbits. No significant paranasal sinus disease. Other: Left anterior scalp/forehead hematoma. CT CERVICAL SPINE FINDINGS Alignment: Mild levocurvature of the cervical spine. 3 mm C3-C4 grade 1 retrolisthesis. Slight C4-C5 grade 1 retrolisthesis. Slight C7-T1 grade 1 anterolisthesis. Skull base and vertebrae: The basion-dental and atlanto-dental intervals are maintained.No evidence of acute fracture to the cervical spine. Soft tissues and  spinal canal: No prevertebral fluid or swelling. No visible canal hematoma. 2 cm right thyroid  lobe nodule. Disc levels: Cervical spondylosis with multilevel disc space narrowing, disc bulges/central disc protrusions, posterior disc osteophyte complexes, endplate spurring and uncovertebral hypertrophy. Disc space narrowing is greatest at C5-C6 and C6-C7 (moderate to advanced at these levels). Ligamentum flavum calcification at C3-C4 and C4-C5. Multilevel spinal canal narrowing. Most notably at C3-C4  and C4-C5, disc bulges and ligamentum flavum calcification contribute to at least moderate spinal canal stenosis. Multilevel bony neural foraminal narrowing. Multilevel ventral osteophytes. Upper chest: No consolidation within the imaged lung apices. No visible pneumothorax. IMPRESSION: CT head: 1.  No evidence of an acute intracranial abnormality. 2. Parenchymal atrophy, chronic small vessel ischemic disease and known chronic infarcts, as described. 3. Left anterior scalp/forehead hematoma. CT cervical spine: 1. No evidence of an acute cervical spine fracture. 2. Mild levocurvature of the cervical spine. 3. Grade 1 retrolisthesis at C3-C4 and C4-C5. 4. Cervical spondylosis as described within the body of the report. At C3-C4 and C4-C5, disc bulges and ligamentum flavum calcification contribute to at least moderate spinal canal stenosis. 5. 2 cm right thyroid  lobe nodule. A non-emergent thyroid  ultrasound is recommended for further evaluation. Reference: J Am Coll Radiol. 2015 Feb;12(2): 143-50. Electronically Signed   By: Rockey Childs D.O.   On: 09/13/2023 12:57   CT CERVICAL SPINE WO CONTRAST Result Date: 09/13/2023 CLINICAL DATA:  Provided history: Polytrauma, blunt. EXAM: CT HEAD WITHOUT CONTRAST CT CERVICAL SPINE WITHOUT CONTRAST TECHNIQUE: Multidetector CT imaging of the head and cervical spine was performed following the standard protocol without intravenous contrast. Multiplanar CT image reconstructions of the cervical spine were also generated. RADIATION DOSE REDUCTION: This exam was performed according to the departmental dose-optimization program which includes automated exposure control, adjustment of the mA and/or kV according to patient size and/or use of iterative reconstruction technique. COMPARISON:  Brain MRI 03/30/2023. Non-contrast head CT and CT angiogram head/neck 10/12/2022. Head FINDINGS: CT HEAD FINDINGS Brain: Mild generalized cerebral atrophy. Known chronic right MCA territory infarcts  within the temporal and parietal lobes. Small chronic infarcts also again demonstrated within the bilateral cerebral hemispheric white matter and cerebellar hemispheres. Patchy and ill-defined hypoattenuation elsewhere in the cerebral white matter, nonspecific but compatible with advanced chronic small vessel ischemic disease. There is no acute intracranial hemorrhage. No acute demarcated cortical infarct. No extra-axial fluid collection. No evidence of an intracranial mass. No midline shift. Vascular: No hyperdense vessel.  Atherosclerotic calcifications. Skull: No calvarial fracture or aggressive osseous lesion. Sinuses/Orbits: No mass or acute finding within the imaged orbits. No significant paranasal sinus disease. Other: Left anterior scalp/forehead hematoma. CT CERVICAL SPINE FINDINGS Alignment: Mild levocurvature of the cervical spine. 3 mm C3-C4 grade 1 retrolisthesis. Slight C4-C5 grade 1 retrolisthesis. Slight C7-T1 grade 1 anterolisthesis. Skull base and vertebrae: The basion-dental and atlanto-dental intervals are maintained.No evidence of acute fracture to the cervical spine. Soft tissues and spinal canal: No prevertebral fluid or swelling. No visible canal hematoma. 2 cm right thyroid  lobe nodule. Disc levels: Cervical spondylosis with multilevel disc space narrowing, disc bulges/central disc protrusions, posterior disc osteophyte complexes, endplate spurring and uncovertebral hypertrophy. Disc space narrowing is greatest at C5-C6 and C6-C7 (moderate to advanced at these levels). Ligamentum flavum calcification at C3-C4 and C4-C5. Multilevel spinal canal narrowing. Most notably at C3-C4 and C4-C5, disc bulges and ligamentum flavum calcification contribute to at least moderate spinal canal stenosis. Multilevel bony neural foraminal narrowing. Multilevel ventral osteophytes. Upper chest: No consolidation within  the imaged lung apices. No visible pneumothorax. IMPRESSION: CT head: 1.  No evidence of an  acute intracranial abnormality. 2. Parenchymal atrophy, chronic small vessel ischemic disease and known chronic infarcts, as described. 3. Left anterior scalp/forehead hematoma. CT cervical spine: 1. No evidence of an acute cervical spine fracture. 2. Mild levocurvature of the cervical spine. 3. Grade 1 retrolisthesis at C3-C4 and C4-C5. 4. Cervical spondylosis as described within the body of the report. At C3-C4 and C4-C5, disc bulges and ligamentum flavum calcification contribute to at least moderate spinal canal stenosis. 5. 2 cm right thyroid  lobe nodule. A non-emergent thyroid  ultrasound is recommended for further evaluation. Reference: J Am Coll Radiol. 2015 Feb;12(2): 143-50. Electronically Signed   By: Rockey Childs D.O.   On: 09/13/2023 12:57   EKG: Independently reviewed.  Sinus rhythm at 70 bpm.  Nonspecific T wave changes.  Assessment/Plan Principal Problem:   Fall down stairs Active Problems:   HTN (hypertension)   HLD (hyperlipidemia)   DM (diabetes mellitus) (HCC)   CKD (chronic kidney disease) stage 2, GFR 60-89 ml/min   History of CVA (cerebrovascular accident)   Carotid arterial disease (HCC)   CAD (coronary artery disease)   Anemia   Fall Concussion > Fell down multiple stairs today.  Did hit her head and has a hematoma but no acute abnormality on imaging which included chest x-ray, pelvis x-ray, CT head, CT C-spine, CT chest abdomen pelvis. > Trauma surgery of seen patient and cleared patient from a trauma standpoint other than the fact patient has a concussion and they want to ensure no bleeding occurs overnight given she is on Plavix  and aspirin  chronically. > Recommended observation and holding Plavix  until CBC is confirmed to be stable tomorrow.  Trauma service have signed off but are available for questions. - Monitor on telemetry overnight - Appreciate trauma team recommendations and assistance. - Trend CBC - Supportive care  Hypertension - Continue home  amlodipine , losartan  - Is prescribed Lasix  as needed  Hyperlipidemia - Continue home Zetia  and rosuvastatin   Diabetes > Await confirmation of home insulin  dose. - SSI for now  CKD 2 - Awaiting CMP - Trend renal function and electrolytes  History of CVA Carotid artery disease - Holding Plavix  and aspirin  until tomorrow at least - Continue home Zetia  and rosuvastatin   CAD - Holding Plavix  and aspirin  as above - Continue Zetia  and rosuvastatin  as above - Continue losartan  as above  Anemia > Hemoglobin stable at 10.6. - Trend CBC  DVT prophylaxis: SCDs for now Code Status:   Full Family Communication:  Updated patient's sister by phone at patient request. Disposition Plan:   Patient is from:  Home  Anticipated DC to:  Home  Anticipated DC date:  1 to 2 days  Anticipated DC barriers: None  Consults called:  Trauma surgery, signed off Admission status:  Observation, telemetry  Severity of Illness: The appropriate patient status for this patient is OBSERVATION. Observation status is judged to be reasonable and necessary in order to provide the required intensity of service to ensure the patient's safety. The patient's presenting symptoms, physical exam findings, and initial radiographic and laboratory data in the context of their medical condition is felt to place them at decreased risk for further clinical deterioration. Furthermore, it is anticipated that the patient will be medically stable for discharge from the hospital within 2 midnights of admission.    Marsa KATHEE Scurry MD [facility]  How to contact the [facility] Attending or Consulting provider 7A -  7P or covering provider during after hours 7P -7A, for this patient?   Check the care team in [facility] and look for a) attending/consulting [facility] provider listed and b) the TR[facility] team listed Log into [email or web address] and use [place]'s universal password to access. If you do not have the password, please contact the hospital  operator. Locate the TR[facility] provider you are looking for under [facility] and page to a number that you can be directly reached. If you still have difficulty reaching the provider, please page the [facility] (Director on Call) for the Hospitalists listed on amion for assistance.  09/13/2023, 2:38 PM

## 2023-09-13 NOTE — H&P (Addendum)
 Consult Note  Sarah [name] 1945-12-27  [id number].     Chief Complaint/Reason for Consult: Level 1 trauma - fall on plavix , hypotensive  HPI:  Patient is a 78 year old female brought in as a level 1 trauma s/p fall down 16 stairs on Plavix . On Plavix  for hx of CVA. Reportedly GCS 14 for EMS and vomited once. She does not remember what led up to the fall. Initial BP in the trauma bay was 80/60, resolved with administration of normal saline. GCS15 on arrival. FAST overall negative but question of small pericardial effusion. Small hematoma noted to left forehead and abrasion to left knee without significant edema or pain.  Denies significant pain anywhere. Lives in a home with her mother and two siblings. PMH otherwise significant for T2DM (dexcom monitor to right upper arm), CAD, HTN, HLD, CKD stage II, glaucoma, Hx of heart murmur. Allergy listed to PCNs in alternate chart with unknown reaction.   ROS: Negative other than HPI.   No family history on file.  No past medical history on file.  Social History:  has no history on file for tobacco use, alcohol  use, and drug use.  Allergies: Not on File  (Not in a hospital admission)   Height 5' (1.524 m), weight 46.9 kg. Physical Exam:  General: pleasant, WD, thin female who is laying in bed in NAD Neck: collar present  HEENT: hematoma to left forehead.  Sclera are noninjected.  PERRL.  Ears and nose without any masses or lesions.  Mouth is pink and moist Heart: regular, rate, and rhythm.  Normal s1,s2. No obvious murmurs, gallops, or rubs noted.  Palpable radial and pedal pulses bilaterally Lungs: CTAB, no wheezes, rhonchi, or rales noted.  Respiratory effort nonlabored Abd: soft, NT, ND, prior hysterectomy scar, no masses, hernias, or organomegaly MS: small abrasion to left knee without bleeding, significant edema or TTP; RLE without pain or deformity. BUE without pain or deformity Skin: warm and dry with no masses,  lesions, or rashes Neuro: Cranial nerves 2-12 grossly intact, sensation is normal throughout Psych: A&Ox3 with an appropriate affect but amnestic to event   Results for orders placed or performed during the hospital encounter of 09/13/23 (from the past 48 hours)  Glucose, capillary     Status: None   Collection Time: 09/13/23 12:09 PM  Result Value Ref Range   Glucose-Capillary 93 70 - 99 mg/dL    Comment: Glucose reference range applies only to samples taken after fasting for at least 8 hours.   No results found.    Assessment/Plan Fall down stairs on Plavix  Left forehead hematoma - local wound care, ice Left knee abrasion - local wound care  Trauma workup completed and CT head and C spine are negative, ok to remove cervical collar. CT chest abdomen and pelvis are negative for acute traumatic injury. Patient mildly repetitive in trauma bay. Recommend consideration of medical admission for observation and PT/OT/SLP for cognitive eval. Likely home with family support tomorrow. Ok to resume plavix  tomorrow if hgb and plts stable. Follow up with PCP and consider referral to concussion clinic if warranted. No other recommendations from a trauma standpoint, trauma will not follow acutely but are available if questions or concerns arise.   FEN: ok for diet VTE: plavix  to resume tomorrow if hgb stable ID: no current abx   I reviewed ED provider notes, last 24 h vitals and pain scores, last 48 h intake and output, last 24 h labs  and trends, and last 24 h imaging results.  This care required high  level of medical decision making.   Burnard JONELLE Louder, [facility] 09/13/2023, 12:38 PM Please see Amion for pager number during day hours 7:00am-4:30pm

## 2023-09-13 NOTE — ED Provider Notes (Signed)
 [facility] AT [facility] Provider Note  CSN: [id number] Arrival date & time: 09/13/23 1221  Chief Complaint(s) Fall  HPI This is a 78 year old female who is here today after a fall.  Patient was walking down the stairs, lost her balance and fell down.  Per EMS, there were 16 steps.  Patient is on Plavix , was hypotensive for them, which activated her as a level 1 trauma.  Patient normotensive in the emergency room.  She is a history of prior CVAs, which is why she takes Plavix .  Also has a history of diabetes.   Past Medical History No past medical history on file. There are no active problems to display for this patient.  Home Medication(s) Prior to Admission medications   Not on File                                                                                                                                    Past Surgical History  Family History No family history on file.  Social History   Allergies Patient has no allergy information on record.  Review of Systems Review of Systems  Physical Exam Vital Signs  I have reviewed the triage vital signs BP (!) 153/60   Pulse 80   Temp 98.1 F (36.7 C) (Oral)   Resp 17   Ht 5' (1.524 m)   Wt 46.9 kg   SpO2 100%   BMI 20.19 kg/m   Physical Exam Vitals and nursing note reviewed.  HENT:     Head: Normocephalic.     Comments: Hematoma to the left forehead    Nose: Nose normal.  Eyes:     Pupils: Pupils are equal, round, and reactive to light.  Neck:     Comments: Cervical collar in place.  No midline spinous process tenderness. Cardiovascular:     Rate and Rhythm: Normal rate.  Pulmonary:     Effort: Pulmonary effort is normal.  Abdominal:     General: Abdomen is flat. There is no distension.     Palpations: Abdomen is soft.     Tenderness: There is no abdominal tenderness.  Musculoskeletal:        General: No swelling or deformity. Normal range of motion.  Skin:    General:  Skin is warm.  Neurological:     Mental Status: He is alert and oriented to person, place, and time.     ED Results and Treatments Labs (all labs ordered are listed, but only abnormal results are displayed) Labs Reviewed  CBC - Abnormal; Notable for the following components:      Result Value   RBC 3.38 (*)    Hemoglobin 10.6 (*)    HCT 35.0 (*)    MCV 103.6 (*)    All other components within normal limits  I-STAT CHEM 8, ED - Abnormal; Notable for the following components:   Glucose, Bld 68 (*)  Hemoglobin 10.9 (*)    HCT 32.0 (*)    All other components within normal limits  GLUCOSE, CAPILLARY  ETHANOL  PROTIME-INR  URINALYSIS, ROUTINE W REFLEX MICROSCOPIC  COMPREHENSIVE METABOLIC PANEL WITH GFR  I-STAT CG4 LACTIC ACID, ED  SAMPLE TO BLOOD BANK                                                                                                                          Radiology DG Pelvis Portable Result Date: 09/13/2023 CLINICAL DATA:  Trauma EXAM: PORTABLE PELVIS 1-2 VIEWS COMPARISON:  November 19, 2021 FINDINGS: No evidence of pelvic fracture or diastasis.No acute hip fracture or dislocation.There is no evidence of arthropathy or other focal bone abnormality.Soft tissues are unremarkable. IMPRESSION: No acute fracture, pelvic bone diastasis, or dislocation. Electronically Signed   By: Rogelia Myers M.D.   On: 09/13/2023 13:15   DG Chest Port 1 View Result Date: 09/13/2023 CLINICAL DATA:  Trauma EXAM: PORTABLE CHEST - 1 VIEW COMPARISON:  None available. FINDINGS: Low lung volumes. No focal airspace consolidation, pleural effusion, or pneumothorax. No cardiomegaly. Tortuous aorta. No acute fracture or destructive lesions. Multilevel thoracic osteophytosis. Cardiac loop recorder device along the left chest wall. IMPRESSION: Low lung volumes.  Otherwise, no acute cardiopulmonary abnormality. Electronically Signed   By: Rogelia Myers M.D.   On: 09/13/2023 13:08   CT CHEST ABDOMEN  PELVIS W CONTRAST Result Date: 09/13/2023 CLINICAL DATA:  Polytrauma, blunt, fall EXAM: CT CHEST, ABDOMEN, AND PELVIS WITH CONTRAST TECHNIQUE: Multidetector CT imaging of the chest, abdomen and pelvis was performed following the standard protocol during bolus administration of intravenous contrast. RADIATION DOSE REDUCTION: This exam was performed according to the departmental dose-optimization program which includes automated exposure control, adjustment of the mA and/or kV according to patient size and/or use of iterative reconstruction technique. CONTRAST:  75mL OMNIPAQUE  IOHEXOL  350 MG/ML SOLN COMPARISON:  09/13/2023, 06/10/2023, 11/19/2021 FINDINGS: CT CHEST FINDINGS Pulmonary Embolism: While the exam was not optimized for the evaluation of the pulmonary arteries, no central pulmonary embolism visualized. Cardiovascular: No cardiomegaly or pericardial effusion.No aortic aneurysm. Multi-vessel coronary and diffuse aortic atherosclerosis. Mediastinum/Nodes: No mediastinal mass.No mediastinal, hilar, or axillary lymphadenopathy. Lungs/Pleura: The midline trachea and bronchi are patent. No focal airspace consolidation, pleural effusion, or pneumothorax. Posterior bibasilar dependent atelectasis. CT ABDOMEN PELVIS FINDINGS Hepatobiliary: No mass.Focal fatty infiltration along the falciform ligament.No radiopaque stones or wall thickening of the gallbladder. No intrahepatic or extrahepatic biliary ductal dilation. The portal veins are patent. Pancreas: No mass or main ductal dilation. No peripancreatic inflammation or fluid collection. Spleen: Normal size. No mass. Adrenals/Urinary Tract: No adrenal masses. No renal mass. No nephrolithiasis or hydronephrosis. The urinary bladder is distended without focal abnormality. Stomach/Bowel: The stomach is decompressed without focal abnormality. No small bowel wall thickening or inflammation. No small bowel obstruction.Normal appendix. Moderate volume fecal loading throughout  the colon. Scattered colonic diverticulosis. Vascular/Lymphatic: No aortic aneurysm. Diffuse aortoiliac atherosclerosis. No intraabdominal or pelvic lymphadenopathy. Reproductive: Hysterectomy. No  concerning adnexal mass.No free pelvic fluid. Other: No pneumoperitoneum, ascites, or mesenteric inflammation. Musculoskeletal: No acute fracture or destructive lesion. Diffuse osteopenia. Multilevel degenerative disc disease of the spine. Cardiac loop recorder device in the anterior left chest. IMPRESSION: No acute, traumatic injury within the chest, abdomen, and pelvis. Electronically Signed   By: Rogelia Myers M.D.   On: 09/13/2023 13:06   CT HEAD WO CONTRAST Result Date: 09/13/2023 CLINICAL DATA:  Provided history: Polytrauma, blunt. EXAM: CT HEAD WITHOUT CONTRAST CT CERVICAL SPINE WITHOUT CONTRAST TECHNIQUE: Multidetector CT imaging of the head and cervical spine was performed following the standard protocol without intravenous contrast. Multiplanar CT image reconstructions of the cervical spine were also generated. RADIATION DOSE REDUCTION: This exam was performed according to the departmental dose-optimization program which includes automated exposure control, adjustment of the mA and/or kV according to patient size and/or use of iterative reconstruction technique. COMPARISON:  Brain MRI 03/30/2023. Non-contrast head CT and CT angiogram head/neck 10/12/2022. Head FINDINGS: CT HEAD FINDINGS Brain: Mild generalized cerebral atrophy. Known chronic right MCA territory infarcts within the temporal and parietal lobes. Small chronic infarcts also again demonstrated within the bilateral cerebral hemispheric white matter and cerebellar hemispheres. Patchy and ill-defined hypoattenuation elsewhere in the cerebral white matter, nonspecific but compatible with advanced chronic small vessel ischemic disease. There is no acute intracranial hemorrhage. No acute demarcated cortical infarct. No extra-axial fluid collection. No  evidence of an intracranial mass. No midline shift. Vascular: No hyperdense vessel.  Atherosclerotic calcifications. Skull: No calvarial fracture or aggressive osseous lesion. Sinuses/Orbits: No mass or acute finding within the imaged orbits. No significant paranasal sinus disease. Other: Left anterior scalp/forehead hematoma. CT CERVICAL SPINE FINDINGS Alignment: Mild levocurvature of the cervical spine. 3 mm C3-C4 grade 1 retrolisthesis. Slight C4-C5 grade 1 retrolisthesis. Slight C7-T1 grade 1 anterolisthesis. Skull base and vertebrae: The basion-dental and atlanto-dental intervals are maintained.No evidence of acute fracture to the cervical spine. Soft tissues and spinal canal: No prevertebral fluid or swelling. No visible canal hematoma. 2 cm right thyroid  lobe nodule. Disc levels: Cervical spondylosis with multilevel disc space narrowing, disc bulges/central disc protrusions, posterior disc osteophyte complexes, endplate spurring and uncovertebral hypertrophy. Disc space narrowing is greatest at C5-C6 and C6-C7 (moderate to advanced at these levels). Ligamentum flavum calcification at C3-C4 and C4-C5. Multilevel spinal canal narrowing. Most notably at C3-C4 and C4-C5, disc bulges and ligamentum flavum calcification contribute to at least moderate spinal canal stenosis. Multilevel bony neural foraminal narrowing. Multilevel ventral osteophytes. Upper chest: No consolidation within the imaged lung apices. No visible pneumothorax. IMPRESSION: CT head: 1.  No evidence of an acute intracranial abnormality. 2. Parenchymal atrophy, chronic small vessel ischemic disease and known chronic infarcts, as described. 3. Left anterior scalp/forehead hematoma. CT cervical spine: 1. No evidence of an acute cervical spine fracture. 2. Mild levocurvature of the cervical spine. 3. Grade 1 retrolisthesis at C3-C4 and C4-C5. 4. Cervical spondylosis as described within the body of the report. At C3-C4 and C4-C5, disc bulges and  ligamentum flavum calcification contribute to at least moderate spinal canal stenosis. 5. 2 cm right thyroid  lobe nodule. A non-emergent thyroid  ultrasound is recommended for further evaluation. Reference: J Am Coll Radiol. 2015 Feb;12(2): 143-50. Electronically Signed   By: Rockey Childs D.O.   On: 09/13/2023 12:57   CT CERVICAL SPINE WO CONTRAST Result Date: 09/13/2023 CLINICAL DATA:  Provided history: Polytrauma, blunt. EXAM: CT HEAD WITHOUT CONTRAST CT CERVICAL SPINE WITHOUT CONTRAST TECHNIQUE: Multidetector CT imaging of the head and cervical  spine was performed following the standard protocol without intravenous contrast. Multiplanar CT image reconstructions of the cervical spine were also generated. RADIATION DOSE REDUCTION: This exam was performed according to the departmental dose-optimization program which includes automated exposure control, adjustment of the mA and/or kV according to patient size and/or use of iterative reconstruction technique. COMPARISON:  Brain MRI 03/30/2023. Non-contrast head CT and CT angiogram head/neck 10/12/2022. Head FINDINGS: CT HEAD FINDINGS Brain: Mild generalized cerebral atrophy. Known chronic right MCA territory infarcts within the temporal and parietal lobes. Small chronic infarcts also again demonstrated within the bilateral cerebral hemispheric white matter and cerebellar hemispheres. Patchy and ill-defined hypoattenuation elsewhere in the cerebral white matter, nonspecific but compatible with advanced chronic small vessel ischemic disease. There is no acute intracranial hemorrhage. No acute demarcated cortical infarct. No extra-axial fluid collection. No evidence of an intracranial mass. No midline shift. Vascular: No hyperdense vessel.  Atherosclerotic calcifications. Skull: No calvarial fracture or aggressive osseous lesion. Sinuses/Orbits: No mass or acute finding within the imaged orbits. No significant paranasal sinus disease. Other: Left anterior scalp/forehead  hematoma. CT CERVICAL SPINE FINDINGS Alignment: Mild levocurvature of the cervical spine. 3 mm C3-C4 grade 1 retrolisthesis. Slight C4-C5 grade 1 retrolisthesis. Slight C7-T1 grade 1 anterolisthesis. Skull base and vertebrae: The basion-dental and atlanto-dental intervals are maintained.No evidence of acute fracture to the cervical spine. Soft tissues and spinal canal: No prevertebral fluid or swelling. No visible canal hematoma. 2 cm right thyroid  lobe nodule. Disc levels: Cervical spondylosis with multilevel disc space narrowing, disc bulges/central disc protrusions, posterior disc osteophyte complexes, endplate spurring and uncovertebral hypertrophy. Disc space narrowing is greatest at C5-C6 and C6-C7 (moderate to advanced at these levels). Ligamentum flavum calcification at C3-C4 and C4-C5. Multilevel spinal canal narrowing. Most notably at C3-C4 and C4-C5, disc bulges and ligamentum flavum calcification contribute to at least moderate spinal canal stenosis. Multilevel bony neural foraminal narrowing. Multilevel ventral osteophytes. Upper chest: No consolidation within the imaged lung apices. No visible pneumothorax. IMPRESSION: CT head: 1.  No evidence of an acute intracranial abnormality. 2. Parenchymal atrophy, chronic small vessel ischemic disease and known chronic infarcts, as described. 3. Left anterior scalp/forehead hematoma. CT cervical spine: 1. No evidence of an acute cervical spine fracture. 2. Mild levocurvature of the cervical spine. 3. Grade 1 retrolisthesis at C3-C4 and C4-C5. 4. Cervical spondylosis as described within the body of the report. At C3-C4 and C4-C5, disc bulges and ligamentum flavum calcification contribute to at least moderate spinal canal stenosis. 5. 2 cm right thyroid  lobe nodule. A non-emergent thyroid  ultrasound is recommended for further evaluation. Reference: J Am Coll Radiol. 2015 Feb;12(2): 143-50. Electronically Signed   By: Rockey Childs D.O.   On: 09/13/2023 12:57     Pertinent labs & imaging results that were available during my care of the patient were reviewed by me and considered in my medical decision making (see MDM for details).  Medications Ordered in ED Medications  sodium chloride  0.9 % bolus 1,000 mL (1,000 mLs Intravenous New Bag/Given 09/13/23 1218)  iohexol  (OMNIPAQUE ) 350 MG/ML injection 75 mL (75 mLs Intravenous Contrast Given 09/13/23 1237)  Procedures .Critical Care  Performed by: Mannie Fairy DASEN, DO Authorized by: Mannie Fairy DASEN, DO   Critical care provider statement:    Critical care time (minutes):  35   Critical care was necessary to treat or prevent imminent or life-threatening deterioration of the following conditions:  Trauma   Critical care was time spent personally by me on the following activities:  Development of treatment plan with patient or surrogate, discussions with consultants, evaluation of patient's response to treatment, examination of patient, ordering and review of laboratory studies, ordering and review of radiographic studies, ordering and performing treatments and interventions, pulse oximetry, re-evaluation of patient's condition and review of old charts Ultrasound ED FAST  Date/Time: 09/13/2023 1:39 PM  Performed by: Mannie Fairy DASEN, DO Authorized by: Mannie Fairy DASEN, DO  Procedure details:    Indications: blunt abdominal trauma and blunt chest trauma       Assess for:  Hemothorax, intra-abdominal fluid, pneumothorax and pericardial effusion    Technique:  Abdominal, cardiac and chest    Images: archived      Abdominal findings:    L kidney:  Visualized   R kidney:  Visualized   Liver:  Visualized    Bladder:  Visualized   Hepatorenal space visualized: identified     Splenorenal space: identified     Rectovesical free fluid: not identified     Splenorenal free  fluid: not identified     Hepatorenal space free fluid: not identified   Cardiac findings:    Heart:  Visualized   Wall motion: identified     Pericardial effusion: not identified   Chest findings:    L lung sliding: identified     R lung sliding: identified     Fluid in thorax: not identified     (including critical care time)  Medical Decision Making / ED Course   This patient presents to the ED for concern of fall, this involves an extensive number of treatment options, and is a complaint that carries with it a high risk of complications and morbidity.  The differential diagnosis includes intracranial hemorrhage, blunt trauma to the chest, abdomen, concussion.  MDM: Upon arrival to the emergency room, patient activated as a level 1 trauma.  Patient alert, protecting her airway.  Blood pressure found to be normal in the ED.  Patient went for imaging of her head, neck, chest abdomen pelvis which fortunately were negative for acute traumatic injury.  Patient's E-FAST was negative.  Patient's blood work overall normal, some mild anemia.  Unclear what her baseline is.  Patient little bit concussed.  Given her age, medical comorbidities and the nature of her fall, both myself and the trauma team believe she is appropriate for observation admission given her rather significant fall.  Will admit to hospitalist service.   Additional history obtained:  -External records from outside source obtained and reviewed including: Chart review including previous notes, labs, imaging, consultation notes   Lab Tests: -I ordered, reviewed, and interpreted labs.   The pertinent results include:   Labs Reviewed  CBC - Abnormal; Notable for the following components:      Result Value   RBC 3.38 (*)    Hemoglobin 10.6 (*)    HCT 35.0 (*)    MCV 103.6 (*)    All other components within normal limits  I-STAT CHEM 8, ED - Abnormal; Notable for the following components:   Glucose, Bld 68 (*)     Hemoglobin 10.9 (*)  HCT 32.0 (*)    All other components within normal limits  GLUCOSE, CAPILLARY  ETHANOL  PROTIME-INR  URINALYSIS, ROUTINE W REFLEX MICROSCOPIC  COMPREHENSIVE METABOLIC PANEL WITH GFR  I-STAT CG4 LACTIC ACID, ED  SAMPLE TO BLOOD BANK      EKG my independent review of the patient's EKG shows no ST segment depressions or elevations, no T wave inversions, no evidence of acute ischemia.  EKG Interpretation Date/Time:  Tuesday September 13 2023 12:13:23 EDT Ventricular Rate:  70 PR Interval:  220 QRS Duration:  80 QT Interval:  360 QTC Calculation: 389 R Axis:   48  Text Interpretation: Sinus rhythm Prolonged PR interval Nonspecific T abnormalities, lateral leads Confirmed by Mannie Pac [phone]) on 09/13/2023 1:39:34 PM         Imaging Studies ordered: I ordered imaging studies including head CT, neck CT, CT chest abdomen pelvis I independently visualized and interpreted imaging. I agree with the radiologist interpretation   Medicines ordered and prescription drug management: Meds ordered this encounter  Medications   sodium chloride  0.9 % bolus 1,000 mL   iohexol  (OMNIPAQUE ) 350 MG/ML injection 75 mL    -I have reviewed the patients home medicines and have made adjustments as needed  Critical interventions Management of level 1 trauma   Cardiac Monitoring: The patient was maintained on a cardiac monitor.  I personally viewed and interpreted the cardiac monitored which showed an underlying rhythm of: Normal sinus rhythm  Social Determinants of Health:  Factors impacting patients care include: Multiple medical comorbidities including prior CVA   Reevaluation: After the interventions noted above, I reevaluated the patient and found that they have :improved  Co morbidities that complicate the patient evaluation No past medical history on file.     Final Clinical Impression(s) / ED Diagnoses Final diagnoses:  Fall, initial encounter   Concussion with unknown loss of consciousness status, initial encounter     @PCDICTATION @    Mannie Pac T, DO 09/13/23 1341

## 2023-09-13 NOTE — Plan of Care (Signed)
 Received from ED accompanied by family and tech.  Oriented to room and surroundings.  Skin assessment done and tele placed on and verified by tele tech.    Problem: Education: Goal: Knowledge of General Education information will improve Description: Including pain rating scale, medication(s)/side effects and non-pharmacologic comfort measures Outcome: Progressing   Problem: Health Behavior/Discharge Planning: Goal: Ability to manage health-related needs will improve Outcome: Progressing   Problem: Clinical Measurements: Goal: Ability to maintain clinical measurements within normal limits will improve Outcome: Progressing   Problem: Clinical Measurements: Goal: Will remain free from infection Outcome: Progressing   Problem: Clinical Measurements: Goal: Diagnostic test results will improve Outcome: Progressing   Problem: Clinical Measurements: Goal: Respiratory complications will improve Outcome: Progressing   Problem: Clinical Measurements: Goal: Cardiovascular complication will be avoided Outcome: Progressing   Problem: Coping: Goal: Level of anxiety will decrease Outcome: Progressing   Problem: Nutrition: Goal: Adequate nutrition will be maintained Outcome: Progressing   Problem: Pain Managment: Goal: General experience of comfort will improve and/or be controlled Outcome: Progressing   Problem: Safety: Goal: Ability to remain free from injury will improve Outcome: Progressing   Problem: Skin Integrity: Goal: Risk for impaired skin integrity will decrease Outcome: Progressing

## 2023-09-13 NOTE — ED Notes (Signed)
 Pt. Back from CT.

## 2023-09-13 NOTE — ED Notes (Signed)
 Up to floor via stretcher with NT, no changes, VSS, alert, NAD, calm, interactive, family at [name] x2.

## 2023-09-14 ENCOUNTER — Ambulatory Visit: Admitting: Family

## 2023-09-14 ENCOUNTER — Encounter (HOSPITAL_COMMUNITY): Payer: Self-pay | Admitting: Internal Medicine

## 2023-09-14 DIAGNOSIS — N182 Chronic kidney disease, stage 2 (mild): Secondary | ICD-10-CM

## 2023-09-14 DIAGNOSIS — E1122 Type 2 diabetes mellitus with diabetic chronic kidney disease: Secondary | ICD-10-CM

## 2023-09-14 DIAGNOSIS — I10 Essential (primary) hypertension: Secondary | ICD-10-CM

## 2023-09-14 DIAGNOSIS — D649 Anemia, unspecified: Secondary | ICD-10-CM

## 2023-09-14 DIAGNOSIS — Z8673 Personal history of transient ischemic attack (TIA), and cerebral infarction without residual deficits: Secondary | ICD-10-CM

## 2023-09-14 DIAGNOSIS — W108XXA Fall (on) (from) other stairs and steps, initial encounter: Secondary | ICD-10-CM | POA: Diagnosis not present

## 2023-09-14 LAB — COMPREHENSIVE METABOLIC PANEL WITH GFR
ALT: 26 U/L (ref 0–44)
AST: 24 U/L (ref 15–41)
Albumin: 2.7 g/dL — ABNORMAL LOW (ref 3.5–5.0)
Alkaline Phosphatase: 58 U/L (ref 38–126)
Anion gap: 6 (ref 5–15)
BUN: 18 mg/dL (ref 8–23)
CO2: 24 mmol/L (ref 22–32)
Calcium: 9.4 mg/dL (ref 8.9–10.3)
Chloride: 109 mmol/L (ref 98–111)
Creatinine, Ser: 1.22 mg/dL — ABNORMAL HIGH (ref 0.44–1.00)
GFR, Estimated: 46 mL/min — ABNORMAL LOW (ref >60)
Glucose, Bld: 113 mg/dL — ABNORMAL HIGH (ref 70–99)
Potassium: 4.3 mmol/L (ref 3.5–5.1)
Sodium: 139 mmol/L (ref 135–145)
Total Bilirubin: 0.4 mg/dL (ref 0.0–1.2)
Total Protein: 5.7 g/dL — ABNORMAL LOW (ref 6.5–8.1)

## 2023-09-14 LAB — CBC
HCT: 30.8 % — ABNORMAL LOW (ref 36.0–46.0)
Hemoglobin: 9.9 g/dL — ABNORMAL LOW (ref 12.0–15.0)
MCH: 31.7 pg (ref 26.0–34.0)
MCHC: 32.1 g/dL (ref 30.0–36.0)
MCV: 98.7 fL (ref 80.0–100.0)
Platelets: 246 K/uL (ref 150–400)
RBC: 3.12 MIL/uL — ABNORMAL LOW (ref 3.87–5.11)
RDW: 13.1 % (ref 11.5–15.5)
WBC: 9.4 K/uL (ref 4.0–10.5)
nRBC: 0 % (ref 0.0–0.2)

## 2023-09-14 LAB — GLUCOSE, CAPILLARY
Glucose-Capillary: 116 mg/dL — ABNORMAL HIGH (ref 70–99)
Glucose-Capillary: 206 mg/dL — ABNORMAL HIGH (ref 70–99)
Glucose-Capillary: 74 mg/dL (ref 70–99)
Glucose-Capillary: 186 mg/dL — ABNORMAL HIGH (ref 70–99)

## 2023-09-14 MED ORDER — CLOPIDOGREL BISULFATE 75 MG PO TABS
75.0000 mg | ORAL_TABLET | Freq: Every day | ORAL | Status: DC
  Administered 2023-09-15 – 2023-09-16 (×2): 75 mg via ORAL
  Filled 2023-09-14 (×2): qty 1

## 2023-09-14 NOTE — Progress Notes (Signed)
    Durable Medical Equipment  (From admission, onward)           Start     Ordered   09/14/23 1343  For home use only DME Bedside commode  Once       Question:  Patient needs a bedside commode to treat with the following condition  Answer:  Weakness   09/14/23 1342            BSC:  The patient is confined to one level of the home environment and there is no toilet on the that level of the home.

## 2023-09-14 NOTE — Plan of Care (Signed)
  Problem: Education: Goal: Knowledge of General Education information will improve Description: Including pain rating scale, medication(s)/side effects and non-pharmacologic comfort measures Outcome: Progressing   Problem: Activity: Goal: Risk for activity intolerance will decrease Outcome: Progressing   Problem: Clinical Measurements: Goal: Cardiovascular complication will be avoided Outcome: Progressing   Problem: Nutrition: Goal: Adequate nutrition will be maintained Outcome: Progressing   Problem: Coping: Goal: Level of anxiety will decrease Outcome: Progressing   Problem: Safety: Goal: Ability to remain free from injury will improve Outcome: Progressing   Problem: Skin Integrity: Goal: Risk for impaired skin integrity will decrease Outcome: Progressing

## 2023-09-14 NOTE — Progress Notes (Addendum)
 Waddell Donath PA-C received updated loop recorder report, have printed this out and stored near [employer].  The full ILR report originally indicated this was a 6 second pause but this was possibly counting between junctional beats as the timestamps appear longer as per strips below:      (Also had older events but during sleeping hours - 5 sec pause 11/19/22 at 4:38, 7 sec pause 07/20/22 at 3:46am.)   Telemetry here shows NSR without significant bradycardia or pauses. Reviewed strips with Dr. Anner. He recommends EP consultation in AM. I relayed update to internal medicine physician via chat then page to please keep NPO after MN and consider switching to bedrest pending EP eval tomorrow. They will also order TSH. Also updated patient's nurse by phone. Sent msg to [employer] inbox/EP APPs to see for EP tomorrow. We will also sign out to the overnight coverage to be aware - please notify cardiology if any new issues arise overnight.

## 2023-09-14 NOTE — TOC Initial Note (Signed)
 Transition of Care [facility]) - Initial/Assessment Note    Patient Details  Name: Sarah [name] MRN: [id number] Date of Birth: Jul 05, 1945  Transition of Care [facility]) CM/SW Contact:    Andrez JULIANNA George, RN Phone Number: 09/14/2023, 1:35 PM  Clinical Narrative:                  Pt is from home with her mother. They have a caregiver that is there 5 days a week: M-F from 10am-6pm. Pts sister is also over every day to assist.  Aide manages her medications and assists with cooking meals.  Pt uses her insurance transportation for appointments.  Home health services arranged with [facility] as pt has used them recently and asked to use them again. Referral sent to [facility] with [facility]. Information on the AVS. [facility] will contact her for the first home visit. Pt has transportation home when medically ready.  IP Care management following.  Expected Discharge Plan: Home w Home Health Services Barriers to Discharge: Continued Medical Work up   Patient Goals and CMS Choice   CMS Medicare.gov Compare Post Acute Care list provided to:: Patient Choice offered to / list presented to : Patient      Expected Discharge Plan and Services   Discharge Planning Services: CM Consult Post Acute Care Choice: Home Health Living arrangements for the past 2 months: Single Family Home                 DME Arranged: Bedside commode DME Agency: [employer] Date DME Agency Contacted: 09/14/23   Representative spoke with at DME Agency: Darlyn HH Arranged: PT, OT HH Agency: [facility] Date [facility] Agency Contacted: 09/14/23   Representative spoke with at [facility] Agency: Adriana  Prior Living Arrangements/Services Living arrangements for the past 2 months: Single Family Home Lives with:: Parents Patient language and need for interpreter reviewed:: Yes Do you feel safe going back to the place where you live?: Yes        Care giver support system in place?: Yes (comment) Current home services: DME (cane/  walker) Criminal Activity/Legal Involvement Pertinent to Current Situation/Hospitalization: No - Comment as needed  Activities of Daily Living      Permission Sought/Granted                  Emotional Assessment Appearance:: Appears stated age Attitude/Demeanor/Rapport: Engaged Affect (typically observed): Accepting Orientation: : Oriented to Self, Oriented to Place, Oriented to  Time, Oriented to Situation   Psych Involvement: No (comment)  Admission diagnosis:  Fall down stairs [W10.8XXA] Fall, initial encounter [W19.XXXA] Concussion with unknown loss of consciousness status, initial encounter [S06.0XAA] Patient Active Problem List   Diagnosis Date Noted   Fall down stairs 09/13/2023   HTN (hypertension) 09/13/2023   HLD (hyperlipidemia) 09/13/2023   DM (diabetes mellitus) (HCC) 09/13/2023   CKD (chronic kidney disease) stage 2, GFR 60-89 ml/min 09/13/2023   History of CVA (cerebrovascular accident) 09/13/2023   Carotid arterial disease (HCC) 09/13/2023   CAD (coronary artery disease) 09/13/2023   Anemia 09/13/2023   PCP:  [place], [name], MD Pharmacy:   [facility] [address], [place] - [address] [place] [address] Phone: [phone] Fax: [phone]     Social Drivers of Health (SDOH) Social History:   SDOH Interventions:     Readmission Risk Interventions     No data to display

## 2023-09-14 NOTE — Progress Notes (Signed)
   Primary team reached out for assistance with interrogation of ILR on this patient. It was last remotely interrogated 09/07/2023 and showed: Battery status OK. Normal device function. No new symptom, tachy, brady, or pause episodes. No new AF episodes. I have reached out to our [employer] rep to interrogate the device while she is here. When the device report is received, will ensure that primary team is aware. If the device interrogation shows anything abnormal primary team will reach out to get formal cardiology consult.   As a marked for merge patient, be sure to check her other chart for all her prior information if not merged yet.  Waddell DELENA Donath, PA-C 09/14/2023 12:14 PM

## 2023-09-14 NOTE — Evaluation (Signed)
 Occupational Therapy Evaluation Patient Details Name: Sarah [name] MRN: [id number] DOB: 04-29-1945 Today's Date: 09/14/2023   History of Present Illness   78 y.o. female [facility]13[facility] after falling down stairs. No acute abnormality of imaging. Pt with scalp hematoma. PMHx: hypertension, hyperlipidemia, diabetes, CKD 2, CVA, carotid artery disease, CAD, GERD, anemia, anxiety     Clinical Impressions PTA patient independent with ADLs, mobility using RW but needing assist for IADLs from family/aide.  Admitted for above and presents with problem list below.  She requires gross contact guard for transfers and mobility in room, noted 2 losses of balance needing min assist to correct, up to min assist for ADLs. She has good support at home but based on performance today pt will best benefit from continued OT services acutely and after dc at [facility] level to optimize independence and safety with ADLs and mobility.      If plan is discharge home, recommend the following:   A little help with walking and/or transfers;A little help with bathing/dressing/bathroom;Assistance with cooking/housework;Direct supervision/assist for medications management;Direct supervision/assist for financial management;Assist for transportation;Help with stairs or ramp for entrance     Functional Status Assessment   Patient has had a recent decline in their functional status and demonstrates the ability to make significant improvements in function in a reasonable and predictable amount of time.     Equipment Recommendations   BSC/3in1     Recommendations for Other Services         Precautions/Restrictions   Precautions Precautions: Fall Recall of Precautions/Restrictions: Impaired Restrictions Weight Bearing Restrictions Per Provider Order: No     Mobility Bed Mobility Overal bed mobility: Needs Assistance Bed Mobility: Sit to Supine       Sit to supine: Supervision         Transfers Overall transfer level: Needs assistance Equipment used: Rolling walker (2 wheels) Transfers: Sit to/from Stand Sit to Stand: Contact guard assist           General transfer comment: for safety, cues for hand placement      Balance Overall balance assessment: Needs assistance Sitting-balance support: No upper extremity supported, Feet supported Sitting balance-Leahy Scale: Fair     Standing balance support: No upper extremity supported Standing balance-Leahy Scale: Fair Standing balance comment: able to stand statically at the sink with no UE support, RW for gait                           ADL either performed or assessed with clinical judgement   ADL Overall ADL's : Needs assistance/impaired     Grooming: Contact guard assist;Standing;Wash/dry hands           Upper Body Dressing : Set up;Sitting   Lower Body Dressing: Contact guard assist;Sit to/from stand Lower Body Dressing Details (indicate cue type and reason): dons/doffs socks, min guard in standing Toilet Transfer: Contact guard assist;Minimal assistance;Ambulation;Rolling walker (2 wheels)           Functional mobility during ADLs: Contact guard assist;Minimal assistance;Rolling walker (2 wheels);Cueing for safety General ADL Comments: min assist for balance, 2 LOB during in room functional mobility     Vision Baseline Vision/History: 1 Wears glasses (reading) Ability to See in Adequate Light: 1 Impaired Patient Visual Report: Blurring of vision Vision Assessment?: Wears glasses for reading;Vision impaired- to be further tested in functional context Additional Comments: pt tracking with increased time, painful with attempting to look L upper quadrant  Perception         Praxis         Pertinent Vitals/Pain Pain Assessment Pain Assessment: Faces Faces Pain Scale: Hurts little more Pain Location: headache Pain Descriptors / Indicators: Headache Pain Intervention(s):  Limited activity within patient's tolerance, Monitored during session, Repositioned, Patient requesting pain meds-RN notified     Extremity/Trunk Assessment Upper Extremity Assessment Upper Extremity Assessment: Right hand dominant;LUE deficits/detail LUE Deficits / Details: decreased FMC and slightly weaker- from prior CVA per pt LUE Coordination: decreased gross motor;decreased fine motor   Lower Extremity Assessment Lower Extremity Assessment: Defer to PT evaluation   Cervical / Trunk Assessment Cervical / Trunk Assessment: Normal   Communication Communication Communication: No apparent difficulties   Cognition Arousal: Alert Behavior During Therapy: Flat affect Cognition: No family/caregiver present to determine baseline, History of cognitive impairments     Awareness: Online awareness impaired Memory impairment (select all impairments): Short-term memory, Working memory Attention impairment (select first level of impairment): Sustained attention Executive functioning impairment (select all impairments): Organization, Sequencing, Reasoning, Problem solving OT - Cognition Comments: patient has hx of cognitive deficits from prior cvas, likley near baseline.  She has great support at home, who assist with IADls.  Noted decreased recall, probelm solving-- needing assist to recall what she wanted to order for lunch                 Following commands: Impaired Following commands impaired: Follows multi-step commands inconsistently, Follows one step commands with increased time     Cueing  General Comments   Cueing Techniques: Verbal cues;Tactile cues;Visual cues      Exercises     Shoulder Instructions      Home Living Family/patient expects to be discharged to:: Private residence Living Arrangements: Parent;Other relatives (mom, sister) Available Help at Discharge: Available 24 hours/day;Personal care attendant (aide when sister isnt there) Type of Home:  House Home Access: Level entry     Home Layout: Two level;Able to live on main level with bedroom/bathroom;Full bath on main level;Bed/bath upstairs Alternate Level Stairs-Number of Steps: 17 Alternate Level Stairs-Rails: Left (for half the steps) Bathroom Shower/Tub: Producer, television/film/video: Standard     Home Equipment: Agricultural consultant (2 wheels);Cane - single point;Grab bars - tub/shower      Lives With:  (mom and sister helps in am,)    Prior Functioning/Environment Prior Level of Function : Needs assist             Mobility Comments: ModI with RW. Denies other falls ADLs Comments: PCA helps with medication, and cooking. Orders groceries to house. manages ADLs without assist.    OT Problem List: Decreased strength;Decreased activity tolerance;Impaired balance (sitting and/or standing);Pain;Impaired vision/perception;Decreased cognition;Decreased safety awareness;Decreased knowledge of use of DME or AE;Decreased knowledge of precautions   OT Treatment/Interventions: Self-care/ADL training;Therapeutic exercise;Energy conservation;Therapeutic activities;Cognitive remediation/compensation;Visual/perceptual remediation/compensation;Patient/family education;Balance training;DME and/or AE instruction      OT Goals(Current goals can be found in the care plan section)   Acute Rehab OT Goals Patient Stated Goal: home OT Goal Formulation: With patient Time For Goal Achievement: 09/28/23 Potential to Achieve Goals: Good   OT Frequency:  Min 2X/week    Co-evaluation              AM-PAC OT 6 Clicks Daily Activity     Outcome Measure Help from another person eating meals?: A Little Help from another person taking care of personal grooming?: A Little Help from another person toileting, which includes using  toliet, bedpan, or urinal?: A Little Help from another person bathing (including washing, rinsing, drying)?: A Little Help from another person to put on and  taking off regular upper body clothing?: A Little Help from another person to put on and taking off regular lower body clothing?: A Little 6 Click Score: 18   End of Session Equipment Utilized During Treatment: Rolling walker (2 wheels) Nurse Communication: Mobility status;Patient requests pain meds  Activity Tolerance: Patient tolerated treatment well Patient left: in bed;with call bell/phone within reach;with bed alarm set  OT Visit Diagnosis: Other abnormalities of gait and mobility (R26.89);Muscle weakness (generalized) (M62.81);Pain;History of falling (Z91.81) Pain - part of body:  (headache)                Time: 8887-8864 OT Time Calculation (min): 23 min Charges:  OT General Charges $OT Visit: 1 Visit OT Evaluation $OT Eval Moderate Complexity: 1 Mod OT Treatments $Self Care/Home Management : 8-22 mins  Etta NOVAK, OT Acute Rehabilitation Services Office [phone] Secure Chat Preferred    Etta GORMAN Hope 09/14/2023, 12:12 PM

## 2023-09-14 NOTE — Plan of Care (Signed)
 Sat up in the chair for a couple hours.  Denies pain today.  All safety precautions in place and reminded to use call button when she needs to get up to go to bathroom, as, at times she scoots to the edge of the bed and the call button goes off.  She does sit there and wait for staff to come and not attempt to get up.    Problem: Education: Goal: Knowledge of General Education information will improve Description: Including pain rating scale, medication(s)/side effects and non-pharmacologic comfort measures Outcome: Progressing   Problem: Clinical Measurements: Goal: Will remain free from infection Outcome: Progressing   Problem: Clinical Measurements: Goal: Diagnostic test results will improve Outcome: Progressing   Problem: Clinical Measurements: Goal: Respiratory complications will improve Outcome: Progressing   Problem: Clinical Measurements: Goal: Cardiovascular complication will be avoided Outcome: Progressing   Problem: Activity: Goal: Risk for activity intolerance will decrease Outcome: Progressing   Problem: Nutrition: Goal: Adequate nutrition will be maintained Outcome: Progressing   Problem: Coping: Goal: Level of anxiety will decrease Outcome: Progressing   Problem: Pain Managment: Goal: General experience of comfort will improve and/or be controlled Outcome: Progressing   Problem: Safety: Goal: Ability to remain free from injury will improve Outcome: Progressing

## 2023-09-14 NOTE — Progress Notes (Signed)
 Progress Note   Patient: Sarah [name] FMW:[id number] DOB: 1945/06/27 DOA: 09/13/2023     0 DOS: the patient was seen and examined on 09/14/2023   Brief hospital course: TREESA MCCULLY is a 78 y.o. female with medical history significant of hypertension, hyperlipidemia, diabetes, CKD 2, CVA, carotid artery disease, CAD, GERD, anemia, anxiety presenting after a fall downstairs at home. Imaging studies negative for acute pathology admitted to [facility] service for further management.  Assessment and Plan: S/p Fall  Concussion injury Possible syncopal episode- Imaging studies unremarkable. Trauma surgery signed off. She did complain of palpitations, has loop recorder. Asked Cardiology for interrogation of loop recorder, awaiting report. Continue telemetry. PT/ OT advised HH services.  Hypertension Continue home amlodipine , losartan    Hyperlipidemia Continue home Zetia  and rosuvastatin    Type 2 Diabetes Takes Farxiga , trulicity  at home. Accucheks, sliding scale for now.   CKD 2 Prior creatinine baseline 0.8 to 1.2. Trend renal function and electrolytes   History of CVA Carotid artery disease Resumed Plavix . Continue home Zetia  and rosuvastatin    CAD Resume Plavix . Continue Zetia  and rosuvastatin  as above Continue losartan  as above   Chronic Anemia Hemoglobin stable at 10.6.        Out of bed to chair. Incentive spirometry. Nursing supportive care. Fall, aspiration precautions. Diet:  Diet Orders (From admission, onward)     Start     Ordered   09/13/23 1426  Diet regular Room service appropriate? Yes; Fluid consistency: Thin  Diet effective now       Question Answer Comment  Room service appropriate? Yes   Fluid consistency: Thin      09/13/23 1429           DVT prophylaxis: SCDs Start: 09/13/23 1424  Level of care: Telemetry Surgical   Code Status: Full Code  Subjective: Patient is seen and examined today morning. She is sitting in chair.  Denies any complaints of chest pain, dizziness. Did report palpitations prior to episode of fall, has loop recorder.  Physical Exam: Vitals:   09/14/23 0007 09/14/23 0409 09/14/23 0756 09/14/23 1146  BP: 125/64 124/62 119/64 127/63  Pulse: 72 74 65 75  Resp: 18 18 16 18   Temp: 98 F (36.7 C) 98.2 F (36.8 C) 98.5 F (36.9 C) 98.7 F (37.1 C)  TempSrc: Oral Oral    SpO2: 98%  99% 99%  Weight:      Height:        General - Elderly Caucasian female, no apparent distress HEENT - PERRLA, EOMI, atraumatic head, non tender sinuses. Lung - Clear, no rales, rhonchi, wheezes. Heart - S1, S2 heard, + murmur, no rubs, trace pedal edema. Abdomen - Soft, non tender, bowel sounds good Neuro - Alert, awake and oriented, non focal exam. Skin - Warm and dry.  Data Reviewed:      Latest Ref Rng & Units 09/14/2023    3:38 AM 09/13/2023   12:41 PM 09/13/2023   12:27 PM  CBC  WBC 4.0 - 10.5 K/uL 9.4   7.3   Hemoglobin 12.0 - 15.0 g/dL 9.9  89.0    89.0  89.3   Hematocrit 36.0 - 46.0 % 30.8  32.0    32.0  35.0   Platelets 150 - 400 K/uL 246   300       Latest Ref Rng & Units 09/14/2023    3:38 AM 09/13/2023   12:41 PM  BMP  Glucose 70 - 99 mg/dL 886  68  68   BUN 8 - 23 mg/dL 18  20    20    Creatinine 0.44 - 1.00 mg/dL 8.77  8.99    8.99   Sodium 135 - 145 mmol/L 139  142    142   Potassium 3.5 - 5.1 mmol/L 4.3  4.1    4.1   Chloride 98 - 111 mmol/L 109  111    111   CO2 22 - 32 mmol/L 24    Calcium  8.9 - 10.3 mg/dL 9.4     DG Pelvis Portable Result Date: 09/13/2023 CLINICAL DATA:  Trauma EXAM: PORTABLE PELVIS 1-2 VIEWS COMPARISON:  November 19, 2021 FINDINGS: No evidence of pelvic fracture or diastasis.No acute hip fracture or dislocation.There is no evidence of arthropathy or other focal bone abnormality.Soft tissues are unremarkable. IMPRESSION: No acute fracture, pelvic bone diastasis, or dislocation. Electronically Signed   By: Rogelia Myers M.D.   On: 09/13/2023 13:15   DG  Chest Port 1 View Result Date: 09/13/2023 CLINICAL DATA:  Trauma EXAM: PORTABLE CHEST - 1 VIEW COMPARISON:  None available. FINDINGS: Low lung volumes. No focal airspace consolidation, pleural effusion, or pneumothorax. No cardiomegaly. Tortuous aorta. No acute fracture or destructive lesions. Multilevel thoracic osteophytosis. Cardiac loop recorder device along the left chest wall. IMPRESSION: Low lung volumes.  Otherwise, no acute cardiopulmonary abnormality. Electronically Signed   By: Rogelia Myers M.D.   On: 09/13/2023 13:08   CT CHEST ABDOMEN PELVIS W CONTRAST Result Date: 09/13/2023 CLINICAL DATA:  Polytrauma, blunt, fall EXAM: CT CHEST, ABDOMEN, AND PELVIS WITH CONTRAST TECHNIQUE: Multidetector CT imaging of the chest, abdomen and pelvis was performed following the standard protocol during bolus administration of intravenous contrast. RADIATION DOSE REDUCTION: This exam was performed according to the departmental dose-optimization program which includes automated exposure control, adjustment of the mA and/or kV according to patient size and/or use of iterative reconstruction technique. CONTRAST:  75mL OMNIPAQUE  IOHEXOL  350 MG/ML SOLN COMPARISON:  09/13/2023, 06/10/2023, 11/19/2021 FINDINGS: CT CHEST FINDINGS Pulmonary Embolism: While the exam was not optimized for the evaluation of the pulmonary arteries, no central pulmonary embolism visualized. Cardiovascular: No cardiomegaly or pericardial effusion.No aortic aneurysm. Multi-vessel coronary and diffuse aortic atherosclerosis. Mediastinum/Nodes: No mediastinal mass.No mediastinal, hilar, or axillary lymphadenopathy. Lungs/Pleura: The midline trachea and bronchi are patent. No focal airspace consolidation, pleural effusion, or pneumothorax. Posterior bibasilar dependent atelectasis. CT ABDOMEN PELVIS FINDINGS Hepatobiliary: No mass.Focal fatty infiltration along the falciform ligament.No radiopaque stones or wall thickening of the gallbladder. No  intrahepatic or extrahepatic biliary ductal dilation. The portal veins are patent. Pancreas: No mass or main ductal dilation. No peripancreatic inflammation or fluid collection. Spleen: Normal size. No mass. Adrenals/Urinary Tract: No adrenal masses. No renal mass. No nephrolithiasis or hydronephrosis. The urinary bladder is distended without focal abnormality. Stomach/Bowel: The stomach is decompressed without focal abnormality. No small bowel wall thickening or inflammation. No small bowel obstruction.Normal appendix. Moderate volume fecal loading throughout the colon. Scattered colonic diverticulosis. Vascular/Lymphatic: No aortic aneurysm. Diffuse aortoiliac atherosclerosis. No intraabdominal or pelvic lymphadenopathy. Reproductive: Hysterectomy. No concerning adnexal mass.No free pelvic fluid. Other: No pneumoperitoneum, ascites, or mesenteric inflammation. Musculoskeletal: No acute fracture or destructive lesion. Diffuse osteopenia. Multilevel degenerative disc disease of the spine. Cardiac loop recorder device in the anterior left chest. IMPRESSION: No acute, traumatic injury within the chest, abdomen, and pelvis. Electronically Signed   By: Rogelia Myers M.D.   On: 09/13/2023 13:06   CT HEAD WO CONTRAST Result Date: 09/13/2023 CLINICAL DATA:  Provided history: Polytrauma,  blunt. EXAM: CT HEAD WITHOUT CONTRAST CT CERVICAL SPINE WITHOUT CONTRAST TECHNIQUE: Multidetector CT imaging of the head and cervical spine was performed following the standard protocol without intravenous contrast. Multiplanar CT image reconstructions of the cervical spine were also generated. RADIATION DOSE REDUCTION: This exam was performed according to the departmental dose-optimization program which includes automated exposure control, adjustment of the mA and/or kV according to patient size and/or use of iterative reconstruction technique. COMPARISON:  Brain MRI 03/30/2023. Non-contrast head CT and CT angiogram head/neck  10/12/2022. Head FINDINGS: CT HEAD FINDINGS Brain: Mild generalized cerebral atrophy. Known chronic right MCA territory infarcts within the temporal and parietal lobes. Small chronic infarcts also again demonstrated within the bilateral cerebral hemispheric white matter and cerebellar hemispheres. Patchy and ill-defined hypoattenuation elsewhere in the cerebral white matter, nonspecific but compatible with advanced chronic small vessel ischemic disease. There is no acute intracranial hemorrhage. No acute demarcated cortical infarct. No extra-axial fluid collection. No evidence of an intracranial mass. No midline shift. Vascular: No hyperdense vessel.  Atherosclerotic calcifications. Skull: No calvarial fracture or aggressive osseous lesion. Sinuses/Orbits: No mass or acute finding within the imaged orbits. No significant paranasal sinus disease. Other: Left anterior scalp/forehead hematoma. CT CERVICAL SPINE FINDINGS Alignment: Mild levocurvature of the cervical spine. 3 mm C3-C4 grade 1 retrolisthesis. Slight C4-C5 grade 1 retrolisthesis. Slight C7-T1 grade 1 anterolisthesis. Skull base and vertebrae: The basion-dental and atlanto-dental intervals are maintained.No evidence of acute fracture to the cervical spine. Soft tissues and spinal canal: No prevertebral fluid or swelling. No visible canal hematoma. 2 cm right thyroid  lobe nodule. Disc levels: Cervical spondylosis with multilevel disc space narrowing, disc bulges/central disc protrusions, posterior disc osteophyte complexes, endplate spurring and uncovertebral hypertrophy. Disc space narrowing is greatest at C5-C6 and C6-C7 (moderate to advanced at these levels). Ligamentum flavum calcification at C3-C4 and C4-C5. Multilevel spinal canal narrowing. Most notably at C3-C4 and C4-C5, disc bulges and ligamentum flavum calcification contribute to at least moderate spinal canal stenosis. Multilevel bony neural foraminal narrowing. Multilevel ventral osteophytes.  Upper chest: No consolidation within the imaged lung apices. No visible pneumothorax. IMPRESSION: CT head: 1.  No evidence of an acute intracranial abnormality. 2. Parenchymal atrophy, chronic small vessel ischemic disease and known chronic infarcts, as described. 3. Left anterior scalp/forehead hematoma. CT cervical spine: 1. No evidence of an acute cervical spine fracture. 2. Mild levocurvature of the cervical spine. 3. Grade 1 retrolisthesis at C3-C4 and C4-C5. 4. Cervical spondylosis as described within the body of the report. At C3-C4 and C4-C5, disc bulges and ligamentum flavum calcification contribute to at least moderate spinal canal stenosis. 5. 2 cm right thyroid  lobe nodule. A non-emergent thyroid  ultrasound is recommended for further evaluation. Reference: J Am Coll Radiol. 2015 Feb;12(2): 143-50. Electronically Signed   By: Rockey Childs D.O.   On: 09/13/2023 12:57   CT CERVICAL SPINE WO CONTRAST Result Date: 09/13/2023 CLINICAL DATA:  Provided history: Polytrauma, blunt. EXAM: CT HEAD WITHOUT CONTRAST CT CERVICAL SPINE WITHOUT CONTRAST TECHNIQUE: Multidetector CT imaging of the head and cervical spine was performed following the standard protocol without intravenous contrast. Multiplanar CT image reconstructions of the cervical spine were also generated. RADIATION DOSE REDUCTION: This exam was performed according to the departmental dose-optimization program which includes automated exposure control, adjustment of the mA and/or kV according to patient size and/or use of iterative reconstruction technique. COMPARISON:  Brain MRI 03/30/2023. Non-contrast head CT and CT angiogram head/neck 10/12/2022. Head FINDINGS: CT HEAD FINDINGS Brain: Mild generalized cerebral atrophy.  Known chronic right MCA territory infarcts within the temporal and parietal lobes. Small chronic infarcts also again demonstrated within the bilateral cerebral hemispheric white matter and cerebellar hemispheres. Patchy and  ill-defined hypoattenuation elsewhere in the cerebral white matter, nonspecific but compatible with advanced chronic small vessel ischemic disease. There is no acute intracranial hemorrhage. No acute demarcated cortical infarct. No extra-axial fluid collection. No evidence of an intracranial mass. No midline shift. Vascular: No hyperdense vessel.  Atherosclerotic calcifications. Skull: No calvarial fracture or aggressive osseous lesion. Sinuses/Orbits: No mass or acute finding within the imaged orbits. No significant paranasal sinus disease. Other: Left anterior scalp/forehead hematoma. CT CERVICAL SPINE FINDINGS Alignment: Mild levocurvature of the cervical spine. 3 mm C3-C4 grade 1 retrolisthesis. Slight C4-C5 grade 1 retrolisthesis. Slight C7-T1 grade 1 anterolisthesis. Skull base and vertebrae: The basion-dental and atlanto-dental intervals are maintained.No evidence of acute fracture to the cervical spine. Soft tissues and spinal canal: No prevertebral fluid or swelling. No visible canal hematoma. 2 cm right thyroid  lobe nodule. Disc levels: Cervical spondylosis with multilevel disc space narrowing, disc bulges/central disc protrusions, posterior disc osteophyte complexes, endplate spurring and uncovertebral hypertrophy. Disc space narrowing is greatest at C5-C6 and C6-C7 (moderate to advanced at these levels). Ligamentum flavum calcification at C3-C4 and C4-C5. Multilevel spinal canal narrowing. Most notably at C3-C4 and C4-C5, disc bulges and ligamentum flavum calcification contribute to at least moderate spinal canal stenosis. Multilevel bony neural foraminal narrowing. Multilevel ventral osteophytes. Upper chest: No consolidation within the imaged lung apices. No visible pneumothorax. IMPRESSION: CT head: 1.  No evidence of an acute intracranial abnormality. 2. Parenchymal atrophy, chronic small vessel ischemic disease and known chronic infarcts, as described. 3. Left anterior scalp/forehead hematoma. CT  cervical spine: 1. No evidence of an acute cervical spine fracture. 2. Mild levocurvature of the cervical spine. 3. Grade 1 retrolisthesis at C3-C4 and C4-C5. 4. Cervical spondylosis as described within the body of the report. At C3-C4 and C4-C5, disc bulges and ligamentum flavum calcification contribute to at least moderate spinal canal stenosis. 5. 2 cm right thyroid  lobe nodule. A non-emergent thyroid  ultrasound is recommended for further evaluation. Reference: J Am Coll Radiol. 2015 Feb;12(2): 143-50. Electronically Signed   By: Rockey Childs D.O.   On: 09/13/2023 12:57    Family Communication: Discussed with patient, understand and agree. All questions answered.  Disposition: Status is: Observation The patient remains OBS appropriate and will d/c before 2 midnights.  Planned Discharge Destination: Home with Home Health     Time spent: 44 minutes  Author: Concepcion Riser, MD 09/14/2023 3:43 PM Secure chat 7am to 7pm For on call review [email or web address].

## 2023-09-14 NOTE — Plan of Care (Signed)

## 2023-09-14 NOTE — Progress Notes (Signed)
 Pt requests MD call sister Winton [phone] (contact in chart) on 9/4 to give her update on cardiac status.

## 2023-09-14 NOTE — Care Management Obs Status (Signed)
 MEDICARE OBSERVATION STATUS NOTIFICATION   Patient Details  Name: Sarah [name] MRN: [id number] Date of Birth: 07-Jun-1945   Medicare Observation Status Notification Given:  Yes  Obs notice signed and copy given   Claretta Deed 09/14/2023, 1:02 PM

## 2023-09-14 NOTE — Evaluation (Addendum)
 Physical Therapy Evaluation Patient Details Name: Sarah [name] MRN: [id number] DOB: 07/08/1945 Today's Date: 09/14/2023  History of Present Illness  78 y.o. female [facility] after falling down stairs. No acute abnormality of imaging. Pt with scalp hematoma. PMHx: hypertension, hyperlipidemia, diabetes, CKD 2, CVA, carotid artery disease, CAD, GERD, anemia, anxiety   Clinical Impression  PTA, pt was ModI for mobility with RW. Pt has 24/7 assist alternating between sister and PCA. Pt presents with decreased safety awareness, generalized weakness, and impaired balance/gait. Pt required CGA for bed mobility and to ambulate 280ft with RW. Cues to avoid obstacles in the hallway and for safety with RW. Able to ascend/descend 2 steps with one handrail and MinA for stability. Pt reported feeling close to functional mobility baseline. Educated pt on s/s of a concussion with pt reporting she had impaired vision and slight dizziness at baseline since prior CVA. Pt only reported having a slight headache. Difficult to assess cognition with no caregiver present to determine baseline. Pt was receiving OP PT prior to admit with recommendation to continue OP PT upon d/c home. Pt would also benefit from continuing to have 24/7 assist at home for safety. Pt would benefit from acute skilled PT with current functional limitations listed below (see PT Problem List). Acute PT to follow.         If plan is discharge home, recommend the following: A lot of help with walking and/or transfers;A lot of help with bathing/dressing/bathroom;Direct supervision/assist for medications management;Direct supervision/assist for financial management;Assist for transportation;Help with stairs or ramp for entrance   Can travel by private vehicle    Yes    Equipment Recommendations BSC/3in1     Functional Status Assessment Patient has had a recent decline in their functional status and demonstrates the ability to  make significant improvements in function in a reasonable and predictable amount of time.     Precautions / Restrictions Precautions Precautions: Fall Restrictions Weight Bearing Restrictions Per Provider Order: No      Mobility  Bed Mobility Overal bed mobility: Needs Assistance Bed Mobility: Rolling, Sidelying to Sit Rolling: Contact guard assist Sidelying to sit: Contact guard assist     General bed mobility comments: increased time and effort with cueing for technique    Transfers Overall transfer level: Needs assistance Equipment used: Rolling walker (2 wheels) Transfers: Sit to/from Stand Sit to Stand: Contact guard assist    General transfer comment: for safety, cues for hand placement    Ambulation/Gait Ambulation/Gait assistance: Contact guard assist Gait Distance (Feet): 200 Feet Assistive device: Rolling walker (2 wheels) Gait Pattern/deviations: Decreased stance time - right, Step-through pattern, Drifts right/left Gait velocity: decr     General Gait Details: slightly decreased stance phase on R LE, pt occasionally drifts right/left with cues for safety with obstacle negotiation  Stairs Stairs: Yes Stairs assistance: Min assist Stair Management: One rail Left, Step to pattern, Sideways Number of Stairs: 2 General stair comments: MinA for stability with cues to hand both hands on handrail with step to pattern    Balance Overall balance assessment: Needs assistance Sitting-balance support: No upper extremity supported, Feet supported Sitting balance-Leahy Scale: Fair     Standing balance support: No upper extremity supported Standing balance-Leahy Scale: Fair Standing balance comment: able to stand statically at the sink with no UE support, RW for gait       Pertinent Vitals/Pain Pain Assessment Pain Assessment: Faces Faces Pain Scale: Hurts a little bit Pain Location: h/a Pain Descriptors /  Indicators: Headache Pain Intervention(s): Limited  activity within patient's tolerance, Monitored during session, Repositioned    Home Living Family/patient expects to be discharged to:: Private residence Living Arrangements: Parent;Other relatives (mom, sister) Available Help at Discharge: Family;Available 24 hours/day;Personal care attendant (PCA for pt and mother) Type of Home: House Home Access: Level entry (level entry from garage) Entrance Stairs-Rails: None   Alternate Level Stairs-Number of Steps: 17 Home Layout: Two level;Able to live on main level with bedroom/bathroom;Full bath on main level Home Equipment: Rolling Walker (2 wheels);Cane - single point      Prior Function Prior Level of Function : Needs assist    Mobility Comments: ModI with RW. Denies other falls ADLs Comments: PCA helps with medication, and cooking. Orders groceries to house.     Extremity/Trunk Assessment   Upper Extremity Assessment Upper Extremity Assessment: Defer to OT evaluation    Lower Extremity Assessment Lower Extremity Assessment: Generalized weakness    Cervical / Trunk Assessment Cervical / Trunk Assessment: Normal  Communication   Communication Communication: No apparent difficulties    Cognition Arousal: Alert Behavior During Therapy: Flat affect   PT - Cognitive impairments: No family/caregiver present to determine baseline, Awareness, Sequencing, Problem solving, Safety/Judgement    PT - Cognition Comments: decreased safety awarness with cues needed to keep hands on RW hands and to avoid obstacles. Unable to remember what caused her to fall down the steps Following commands: Impaired Following commands impaired: Only follows one step commands consistently, Follows multi-step commands inconsistently     Cueing Cueing Techniques: Verbal cues, Tactile cues, Visual cues     General Comments General comments (skin integrity, edema, etc.): Discussed s/s of a concussion with handout given and pt verbalizing understanding      PT Assessment Patient needs continued PT services  PT Problem List Decreased strength;Decreased activity tolerance;Decreased mobility;Decreased cognition;Decreased knowledge of use of DME;Decreased safety awareness       PT Treatment Interventions DME instruction;Gait training;Stair training;Functional mobility training;Therapeutic activities;Therapeutic exercise;Balance training;Neuromuscular re-education;Patient/family education    PT Goals (Current goals can be found in the Care Plan section)  Acute Rehab PT Goals Patient Stated Goal: to go home PT Goal Formulation: With patient Time For Goal Achievement: 09/28/23 Potential to Achieve Goals: Good    Frequency Min 2X/week        AM-PAC PT 6 Clicks Mobility  Outcome Measure Help needed turning from your back to your side while in a flat bed without using bedrails?: A Little Help needed moving from lying on your back to sitting on the side of a flat bed without using bedrails?: A Little Help needed moving to and from a bed to a chair (including a wheelchair)?: A Little Help needed standing up from a chair using your arms (e.g., wheelchair or bedside chair)?: A Little Help needed to walk in hospital room?: A Little Help needed climbing 3-5 steps with a railing? : A Little 6 Click Score: 18    End of Session Equipment Utilized During Treatment: Gait belt Activity Tolerance: Patient tolerated treatment well Patient left: in chair;with call bell/phone within reach;with chair alarm set Nurse Communication: Mobility status PT Visit Diagnosis: Unsteadiness on feet (R26.81);Other abnormalities of gait and mobility (R26.89);Muscle weakness (generalized) (M62.81);History of falling (Z91.81)    Time: 9193-9167 PT Time Calculation (min) (ACUTE ONLY): 26 min   Charges:   PT Evaluation $PT Eval Low Complexity: 1 Low PT Treatments $Therapeutic Activity: 8-22 mins PT General Charges $$ ACUTE PT VISIT: 1 Visit  Dennise Raabe W, PT,  DPT Secure Chat Preferred  Rehab Office [phone]   Kate BRAVO Wendolyn 09/14/2023, 8:40 AM

## 2023-09-14 NOTE — Evaluation (Signed)
 Speech Language Pathology Evaluation Patient Details Name: Sarah [name] MRN: [id number] DOB: Nov 17, 1945 Today's Date: 09/14/2023 Time: 8986-8975 SLP Time Calculation (min) (ACUTE ONLY): 11 min  Problem List:  Patient Active Problem List   Diagnosis Date Noted   Fall down stairs 09/13/2023   HTN (hypertension) 09/13/2023   HLD (hyperlipidemia) 09/13/2023   DM (diabetes mellitus) (HCC) 09/13/2023   CKD (chronic kidney disease) stage 2, GFR 60-89 ml/min 09/13/2023   History of CVA (cerebrovascular accident) 09/13/2023   Carotid arterial disease (HCC) 09/13/2023   CAD (coronary artery disease) 09/13/2023   Anemia 09/13/2023   Past Medical History: No past medical history on file. Past Surgical History: The histories are not reviewed yet. Please review them in the History navigator section and refresh this SmartLink. HPI:  78 y.o. female presents to [facility] 09/13/23 after falling down stairs. No acute abnormality of imaging. Pt with scalp hematoma. PMHx: hypertension, hyperlipidemia, diabetes, CKD 2, CVA (pt reports 2-3), carotid artery disease, CAD, GERD, anemia, anxiety   Assessment / Plan / Recommendation Clinical Impression  Pt feels her cognition is the same as  prior to admission. Pt states she has difficulty with memory, thinking and vision from old strokes and has significant help at home (lives with mom, sister helps in am and has personal care attendant). Her speech and language are appropriate. Pt given various subtest and although she recalled 3 words with delay she was unaware of repeating same animal x 2 during divergent naming subtest. Verbal problem solving for simple scenarios was adequate. She was appropriately oriented and states she uses a calendar at home. Poor visual spacing of numbers on clock and she was unable to see numbers to place hands for certain time because I can't see it due to vision from old strokes. SLP suspects her cognitive impairments are at or close  to baseline and she has necessary assist at home who assist her with finances, medication and recall of important information. SLP does not recommend further ST at this time but encouraged her to continue using her calendar and writing information for recall.    SLP Assessment  SLP Recommendation/Assessment: Patient does not need any further Speech Language Pathology Services SLP Visit Diagnosis: Cognitive communication deficit (R41.841)     Assistance Recommended at Discharge  Other (comment) (has assistance at home)  Functional Status Assessment Patient has not had a recent decline in their functional status  Frequency and Duration           SLP Evaluation Cognition  Overall Cognitive Status: History of cognitive impairments - at baseline Arousal/Alertness: Awake/alert Orientation Level: Oriented to person (oriented to DOW, month and date, ox to place and reason per PT) Month: September Day of Week: Correct Attention: Sustained Sustained Attention: Appears intact Memory: Impaired Memory Impairment:  (see impression statement) Awareness:  (appears at baseline) Problem Solving: Appears intact (for simple verbal) Safety/Judgment: Impaired       Comprehension  Auditory Comprehension Overall Auditory Comprehension: Appears within functional limits for tasks assessed Visual Recognition/Discrimination Discrimination: Not tested Reading Comprehension Reading Status: Not tested    Expression Expression Primary Mode of Expression: Verbal Verbal Expression Overall Verbal Expression: Appears within functional limits for tasks assessed Initiation: No impairment Level of Generative/Spontaneous Verbalization: Conversation Repetition:  (NT) Naming: Impairment Divergent:  (named 9 animals in one min) Pragmatics: No impairment Written Expression Dominant Hand: Right Written Expression: Not tested   Oral / Motor  Motor Speech Overall Motor Speech: Appears within functional limits  for tasks  assessed Respiration: Within functional limits Phonation: Normal Resonance: Within functional limits Articulation: Within functional limitis Intelligibility: Intelligible Motor Planning: Within functional limits Motor Speech Errors: Not applicable            Dustin Olam Bull 09/14/2023, 10:50 AM

## 2023-09-15 ENCOUNTER — Telehealth: Payer: Self-pay

## 2023-09-15 ENCOUNTER — Ambulatory Visit: Payer: Self-pay | Admitting: Cardiovascular Disease

## 2023-09-15 DIAGNOSIS — R55 Syncope and collapse: Secondary | ICD-10-CM | POA: Diagnosis not present

## 2023-09-15 DIAGNOSIS — I10 Essential (primary) hypertension: Secondary | ICD-10-CM | POA: Diagnosis not present

## 2023-09-15 DIAGNOSIS — Z8673 Personal history of transient ischemic attack (TIA), and cerebral infarction without residual deficits: Secondary | ICD-10-CM | POA: Diagnosis not present

## 2023-09-15 DIAGNOSIS — Z95818 Presence of other cardiac implants and grafts: Secondary | ICD-10-CM

## 2023-09-15 DIAGNOSIS — E1122 Type 2 diabetes mellitus with diabetic chronic kidney disease: Secondary | ICD-10-CM | POA: Diagnosis not present

## 2023-09-15 DIAGNOSIS — W108XXA Fall (on) (from) other stairs and steps, initial encounter: Secondary | ICD-10-CM | POA: Diagnosis not present

## 2023-09-15 LAB — BASIC METABOLIC PANEL WITH GFR
Anion gap: 7 (ref 5–15)
BUN: 22 mg/dL (ref 8–23)
CO2: 24 mmol/L (ref 22–32)
Calcium: 9.4 mg/dL (ref 8.9–10.3)
Chloride: 109 mmol/L (ref 98–111)
Creatinine, Ser: 1.28 mg/dL — ABNORMAL HIGH (ref 0.44–1.00)
GFR, Estimated: 43 mL/min — ABNORMAL LOW (ref >60)
Glucose, Bld: 130 mg/dL — ABNORMAL HIGH (ref 70–99)
Potassium: 4.6 mmol/L (ref 3.5–5.1)
Sodium: 140 mmol/L (ref 135–145)

## 2023-09-15 LAB — CBC
HCT: 29.4 % — ABNORMAL LOW (ref 36.0–46.0)
Hemoglobin: 9.3 g/dL — ABNORMAL LOW (ref 12.0–15.0)
MCH: 31.1 pg (ref 26.0–34.0)
MCHC: 31.6 g/dL (ref 30.0–36.0)
MCV: 98.3 fL (ref 80.0–100.0)
Platelets: 265 K/uL (ref 150–400)
RBC: 2.99 MIL/uL — ABNORMAL LOW (ref 3.87–5.11)
RDW: 13 % (ref 11.5–15.5)
WBC: 6.5 K/uL (ref 4.0–10.5)
nRBC: 0 % (ref 0.0–0.2)

## 2023-09-15 LAB — GLUCOSE, CAPILLARY
Glucose-Capillary: 120 mg/dL — ABNORMAL HIGH (ref 70–99)
Glucose-Capillary: 147 mg/dL — ABNORMAL HIGH (ref 70–99)
Glucose-Capillary: 80 mg/dL (ref 70–99)

## 2023-09-15 LAB — PHOSPHORUS: Phosphorus: 4.3 mg/dL (ref 2.5–4.6)

## 2023-09-15 LAB — TSH: TSH: 1.413 u[IU]/mL (ref 0.350–4.500)

## 2023-09-15 LAB — MAGNESIUM: Magnesium: 2.2 mg/dL (ref 1.7–2.4)

## 2023-09-15 NOTE — Telephone Encounter (Signed)
 Alert received from [employer] for 1 pause 9/2 9/2 @ 11:11, duration 6sec, non-conducted P waves followed by escape beat.  After further review note patients chart has to be Merged and patient was seen in ER for fall around that time of event.

## 2023-09-15 NOTE — Progress Notes (Signed)
 Occupational Therapy Treatment Patient Details Name: Sarah [name] MRN: [id number] DOB: 1945/08/06 Today's Date: 09/15/2023   History of present illness 78 y.o. female presents to [facility] 09/13/23 after falling down stairs. No acute abnormality of imaging. Pt with scalp hematoma. PMHx: hypertension, hyperlipidemia, diabetes, CKD 2, CVA, carotid artery disease, CAD, GERD, anemia, anxiety   OT comments  Patient making good gains with OT treatment with bed mobility, transfers, and standing ADLs. Discharge recommendations continue to be appropriate for HHOT to follow once discharge from hospital setting. Acute OT to continue to follow to address established goals.       If plan is discharge home, recommend the following:  A little help with walking and/or transfers;A little help with bathing/dressing/bathroom;Assistance with cooking/housework;Direct supervision/assist for medications management;Direct supervision/assist for financial management;Assist for transportation;Help with stairs or ramp for entrance   Equipment Recommendations  BSC/3in1    Recommendations for Other Services      Precautions / Restrictions Precautions Precautions: Fall Recall of Precautions/Restrictions: Impaired Restrictions Weight Bearing Restrictions Per Provider Order: No       Mobility Bed Mobility Overal bed mobility: Needs Assistance Bed Mobility: Supine to Sit, Sit to Supine     Supine to sit: Supervision, Used rails Sit to supine: Supervision, Used rails   General bed mobility comments: increased time and use of bed rails    Transfers Overall transfer level: Needs assistance Equipment used: Rolling walker (2 wheels) Transfers: Sit to/from Stand Sit to Stand: Contact guard assist           General transfer comment: cues for hand placement and CGA for balance     Balance Overall balance assessment: Needs assistance Sitting-balance support: No upper extremity supported, Feet  supported Sitting balance-Leahy Scale: Fair Sitting balance - Comments: EOB   Standing balance support: Bilateral upper extremity supported, No upper extremity supported, During functional activity Standing balance-Leahy Scale: Fair Standing balance comment: able to stand at sink for self care tasks with no UE support                           ADL either performed or assessed with clinical judgement   ADL Overall ADL's : Needs assistance/impaired     Grooming: Wash/dry hands;Wash/dry face;Oral care;Brushing hair;Contact guard assist;Standing Grooming Details (indicate cue type and reason): at sink         Upper Body Dressing : Set up;Sitting Upper Body Dressing Details (indicate cue type and reason): gown for back     Toilet Transfer: Contact guard assist;Ambulation;Rolling walker (2 wheels) Toilet Transfer Details (indicate cue type and reason): simulated to chair         Functional mobility during ADLs: Contact guard assist;Rolling walker (2 wheels) General ADL Comments: CGA for balance when performing standing ADL tasks    Extremity/Trunk Assessment              Vision       Perception     Praxis     Communication Communication Communication: No apparent difficulties   Cognition Arousal: Alert Behavior During Therapy: Flat affect Cognition: No family/caregiver present to determine baseline, History of cognitive impairments     Awareness: Online awareness impaired Memory impairment (select all impairments): Short-term memory, Working memory Attention impairment (select first level of impairment): Sustained attention Executive functioning impairment (select all impairments): Organization, Sequencing, Reasoning, Problem solving OT - Cognition Comments: alert and oriented x3  Following commands: Impaired Following commands impaired: Follows multi-step commands inconsistently, Follows one step commands with increased time       Cueing   Cueing Techniques: Verbal cues, Tactile cues, Visual cues  Exercises      Shoulder Instructions       General Comments      Pertinent Vitals/ Pain       Pain Assessment Pain Assessment: Faces Faces Pain Scale: Hurts a little bit Pain Location: headache Pain Descriptors / Indicators: Headache Pain Intervention(s): Limited activity within patient's tolerance  Home Living                                          Prior Functioning/Environment              Frequency  Min 2X/week        Progress Toward Goals  OT Goals(current goals can now be found in the care plan section)  Progress towards OT goals: Progressing toward goals  Acute Rehab OT Goals Patient Stated Goal: to go home OT Goal Formulation: With patient Time For Goal Achievement: 09/28/23 Potential to Achieve Goals: Good ADL Goals Pt Will Perform Grooming: with modified independence;standing Pt Will Perform Upper Body Dressing: with set-up;sitting Pt Will Perform Lower Body Dressing: with supervision;sit to/from stand Pt Will Transfer to Toilet: with supervision;ambulating;bedside commode Pt Will Perform Tub/Shower Transfer: Shower transfer;with supervision;ambulating;rolling walker;shower seat  Plan      Co-evaluation                 AM-PAC OT 6 Clicks Daily Activity     Outcome Measure   Help from another person eating meals?: A Little Help from another person taking care of personal grooming?: A Little Help from another person toileting, which includes using toliet, bedpan, or urinal?: A Little Help from another person bathing (including washing, rinsing, drying)?: A Little Help from another person to put on and taking off regular upper body clothing?: A Little Help from another person to put on and taking off regular lower body clothing?: A Little 6 Click Score: 18    End of Session Equipment Utilized During Treatment: Gait belt;Rolling walker (2  wheels)  OT Visit Diagnosis: Other abnormalities of gait and mobility (R26.89);Muscle weakness (generalized) (M62.81);Pain;History of falling (Z91.81) Pain - part of body:  (headache)   Activity Tolerance Patient tolerated treatment well   Patient Left in bed;with call bell/phone within reach;with bed alarm set   Nurse Communication Mobility status        Time: 8869-8845 OT Time Calculation (min): 24 min  Charges: OT General Charges $OT Visit: 1 Visit OT Treatments $Self Care/Home Management : 23-37 mins  Dick Laine, OTA Acute Rehabilitation Services  Office [phone]   Jeb LITTIE Laine 09/15/2023, 12:33 PM

## 2023-09-15 NOTE — Plan of Care (Signed)
 Assisted to bathroom with walker. NPO for procedure. Denies any pain.    Problem: Education: Goal: Knowledge of General Education information will improve Description: Including pain rating scale, medication(s)/side effects and non-pharmacologic comfort measures Outcome: Progressing   Problem: Activity: Goal: Risk for activity intolerance will decrease Outcome: Progressing   Problem: Nutrition: Goal: Adequate nutrition will be maintained Outcome: Progressing   Problem: Coping: Goal: Level of anxiety will decrease Outcome: Progressing   Problem: Safety: Goal: Ability to remain free from injury will improve Outcome: Progressing   Problem: Skin Integrity: Goal: Risk for impaired skin integrity will decrease Outcome: Progressing

## 2023-09-15 NOTE — Progress Notes (Signed)
 Transition of Care [facility]) - CAGE-AID Screening   Patient Details  Name: Sarah [name] MRN: [id number] Date of Birth: Jul 17, 1945  Transition of Care [facility]) CM/SW Contact:    Bernardino Mayotte, RN Phone Number: 09/15/2023, 12:50 AM   Clinical Narrative:  Patient endorses some alcohol  use, denies illicit drugs. Resources not given at this time.  CAGE-AID Screening:    Have You Ever Felt You Ought to Cut Down on Your Drinking or Drug Use?: No Have People Annoyed You By Critizing Your Drinking Or Drug Use?: No Have You Felt Bad Or Guilty About Your Drinking Or Drug Use?: No Have You Ever Had a Drink or Used Drugs First Thing In The Morning to Steady Your Nerves or to Get Rid of a Hangover?: No CAGE-AID Score: 0  Substance Abuse Education Offered: No

## 2023-09-15 NOTE — Progress Notes (Signed)
 Progress Note   Patient: Sarah [name] FMW:[id number] DOB: 1945/11/17 DOA: 09/13/2023     0 DOS: the patient was seen and examined on 09/15/2023   Brief hospital course: Sarah [name] is a 78 y.o. female with medical history significant of hypertension, hyperlipidemia, diabetes, CKD 2, CVA, carotid artery disease, CAD, GERD, anemia, anxiety presenting after a fall downstairs at home. Imaging studies negative for acute pathology admitted to TRH service for further management.  Assessment and Plan: S/p Fall  Concussion injury Possible syncopal episode- Imaging studies unremarkable. Trauma surgery signed off. She did complain of palpitations during fall episode, she has loop recorder. Loop recorder interrogated showed long pauses.  EP consulted. Continue telemetry.  Will follow EP recommendations. PT/ OT advised HH services.  Hypertension Continue home amlodipine , losartan    Hyperlipidemia Continue home Zetia  and rosuvastatin    Type 2 Diabetes Takes Farxiga , trulicity  at home. Accucheks, sliding scale for now.   CKD 2 Prior creatinine baseline 0.8 to 1.2.   renal function stable. Trend renal function and electrolytes   History of CVA Carotid artery disease Resumed Plavix . Continue home Zetia  and rosuvastatin    CAD Continue Plavix . Continue Zetia  and rosuvastatin .   Chronic Anemia Hemoglobin stable at baseline.     Out of bed to chair. Incentive spirometry. Nursing supportive care. Fall, aspiration precautions. Diet:  Diet Orders (From admission, onward)     Start     Ordered   09/15/23 0001  Diet NPO time specified Except for: Sips with Meds  Diet effective midnight       Question:  Except for  Answer:  Noralyn with Meds   09/14/23 1850           DVT prophylaxis: SCDs Start: 09/13/23 1424  Level of care: Telemetry Surgical   Code Status: Full Code  Subjective: Patient is seen and examined today morning.  She is lying in bed.  No chest pain,  dizziness or palpitations.  Feels weak, eating fair.  Physical Exam: Vitals:   09/15/23 0011 09/15/23 0326 09/15/23 1000 09/15/23 1210  BP: 127/71 133/68 (!) 148/67 (!) 148/67  Pulse: 67 61 67 71  Resp: 18 18 18 18   Temp: 98.5 F (36.9 C) 98.7 F (37.1 C) (!) 97.5 F (36.4 C) 97.9 F (36.6 C)  TempSrc: Axillary Oral Oral Oral  SpO2: 98% 99% 99% 100%  Weight:      Height:        General - Elderly Caucasian female, no apparent distress HEENT - PERRLA, EOMI, atraumatic head, non tender sinuses. Lung - Clear, no rales, rhonchi, wheezes. Heart - S1, S2 heard, + murmur, no rubs, trace pedal edema. Abdomen - Soft, non tender, bowel sounds good Neuro - Alert, awake and oriented, non focal exam. Skin - Warm and dry.  Data Reviewed:      Latest Ref Rng & Units 09/15/2023    3:04 AM 09/14/2023    3:38 AM 09/13/2023   12:41 PM  CBC  WBC 4.0 - 10.5 K/uL 6.5  9.4    Hemoglobin 12.0 - 15.0 g/dL 9.3  9.9  89.0    89.0   Hematocrit 36.0 - 46.0 % 29.4  30.8  32.0    32.0   Platelets 150 - 400 K/uL 265  246        Latest Ref Rng & Units 09/15/2023    3:04 AM 09/14/2023    3:38 AM 09/13/2023   12:41 PM  BMP  Glucose 70 - 99 mg/dL 869  113  68    68   BUN 8 - 23 mg/dL 22  18  20    20    Creatinine 0.44 - 1.00 mg/dL 8.71  8.77  8.99    8.99   Sodium 135 - 145 mmol/L 140  139  142    142   Potassium 3.5 - 5.1 mmol/L 4.6  4.3  4.1    4.1   Chloride 98 - 111 mmol/L 109  109  111    111   CO2 22 - 32 mmol/L 24  24    Calcium  8.9 - 10.3 mg/dL 9.4  9.4     No results found.   Family Communication: Discussed with patient, understand and agree. All questions answered.  Disposition: Status is: Observation The patient will require care spanning > 2 midnights and should be moved to inpatient because: EP consultation for long pauses seen on loop recorder interrogation.  Planned Discharge Destination: Home with Home Health     Time spent: 42 minutes  Author: Concepcion Riser,  MD 09/15/2023 3:11 PM Secure chat 7am to 7pm For on call review [email or web address].

## 2023-09-15 NOTE — Consult Note (Signed)
 ELECTROPHYSIOLOGY CONSULT NOTE    Patient ID: Sarah [name] MRN: [id number], DOB/AGE: 1945-07-30 78 y.o.  Admit date: 09/13/2023 Date of Consult: 09/15/2023  Primary Physician: [place], [name], MD Primary Cardiologist: None  Electrophysiologist: Dr. Kennyth   Referring Provider: Dr. Darci  Patient Profile: Sarah [name] is a 78 y.o. female with a history of CAD s/p PCI to RCA, CVA (multiple), carotid artery disease, hypertension, hyperlipidemia, DM type II, GERD who is being seen today for the evaluation of syncope/fall at the request of Dr. Seena.  HPI:  Sarah [name] is a 78 y.o. female with above history who presented to the ED with EMS as a level 1 trauma after falling down 16 stairs. Patient hypotensive with EMS and initially in the ED. Per EMS runsheet, fall was witnessed by family, not sure if there was loss of consciousness. Initially with EMS, patient was confused but did have clarity of thought en route to the ED.  Trauma workup found no acute head or c-spine abnormalities. CT chest/abd/pelvis negative for acute injury. EP asked to see patient due to bradycardia/pauses seen on ILR.   Patient first had ILR placed in 2018 in the setting of CVA. No AF seen on this device. In 2023, patient had cryptogenic stroke, with MRI concerning for cardio-embolic source. Patient later saw Dr. Santo in April of 2024 and was ultimately referred back to EP for new ILR placement. New ILR placed by Dr. Nancey June 2024. As of 09/07/23 ILR report, no AF ever detected. This report also noted no brady, or pause episodes. Previously only isolated nocturnal pauses seen in July and November of 2024.   New ILR report from 9/3 shows significant pause at 11:11 am, fitting the timeframe of patient's fall.  On exam today, patient has limited recall of events surrounding her fall. She describes sensation of heart palpitations as she walked upstairs on the morning of 9/2. She spent  time in her room organizing clothes and then has no further memory until waking up in the ambulance.   She denies any recent chest pain, palpitations, dyspnea, PND, orthopnea, nausea, vomiting, dizziness, syncope, edema, weight gain, or early satiety.   Labs Potassium4.6 (09/04 0304) Magnesium  2.2 (09/04 0304) Creatinine, ser  1.28* (09/04 0304) PLT  265 (09/04 0304) HGB  9.3* (09/04 0304) WBC 6.5 (09/04 0304)  .    History reviewed. No pertinent past medical history.   Surgical History: History reviewed. No pertinent surgical history.   Medications Prior to Admission  Medication Sig Dispense Refill Last Dose/Taking   amLODipine  (NORVASC ) 5 MG tablet Take 5 mg by mouth daily.   09/13/2023   clopidogrel  (PLAVIX ) 75 MG tablet Take 75 mg by mouth daily.   09/13/2023   ezetimibe  (ZETIA ) 10 MG tablet Take 10 mg by mouth daily.   09/13/2023   FARXIGA  10 MG TABS tablet Take 10 mg by mouth daily.   09/13/2023   latanoprost  (XALATAN ) 0.005 % ophthalmic solution Place 1 drop into both eyes at bedtime.   09/12/2023   losartan  (COZAAR ) 50 MG tablet Take 50 mg by mouth daily.   09/13/2023   ondansetron  (ZOFRAN ) 4 MG tablet Take 4 mg by mouth 2 (two) times daily as needed.   Unknown   rosuvastatin  (CRESTOR ) 20 MG tablet Take 20 mg by mouth at bedtime.   Taking   sertraline  (ZOLOFT ) 50 MG tablet Take 50 mg by mouth daily.   09/13/2023   topiramate  (TOPAMAX ) 25 MG tablet Take  25 mg by mouth at bedtime.   09/12/2023   TRULICITY  1.5 MG/0.5ML SOAJ Inject 1.5 mg into the skin once a week.   09/06/2023   gabapentin  (NEURONTIN ) 100 MG capsule Take 200 mg by mouth at bedtime. (Patient not taking: Reported on 09/13/2023)   Not Taking    Inpatient Medications:   amLODipine   5 mg Oral Daily   clopidogrel   75 mg Oral Daily   ezetimibe   10 mg Oral Daily   insulin  aspart  0-15 Units Subcutaneous TID WC   latanoprost   1 drop Both Eyes QHS   losartan   50 mg Oral Daily   rosuvastatin   20 mg Oral QHS   sertraline   50 mg Oral  Daily   sodium chloride  flush  3 mL Intravenous Q12H   topiramate   25 mg Oral QHS    Allergies: Not on File  History reviewed. No pertinent family history.   Physical Exam: Vitals:   09/14/23 2016 09/15/23 0011 09/15/23 0326 09/15/23 1000  BP: 137/64 127/71 133/68 (!) 148/67  Pulse: 71 67 61 67  Resp:  18 18 18   Temp: 98.2 F (36.8 C) 98.5 F (36.9 C) 98.7 F (37.1 C) (!) 97.5 F (36.4 C)  TempSrc: Oral Axillary Oral Oral  SpO2: 98% 98% 99% 99%  Weight:      Height:        GEN- NAD, A&O x 3, normal affect HEENT: Normocephalic, atraumatic Lungs- CTAB, Normal effort.  Heart- Regular rate and rhythm, faint systolic murmur.  GI- Soft, NT, ND.  Extremities- No clubbing, cyanosis, or edema   Radiology/Studies: DG Pelvis Portable Result Date: 09/13/2023 CLINICAL DATA:  Trauma EXAM: PORTABLE PELVIS 1-2 VIEWS COMPARISON:  November 19, 2021 FINDINGS: No evidence of pelvic fracture or diastasis.No acute hip fracture or dislocation.There is no evidence of arthropathy or other focal bone abnormality.Soft tissues are unremarkable. IMPRESSION: No acute fracture, pelvic bone diastasis, or dislocation. Electronically Signed   By: Rogelia Myers M.D.   On: 09/13/2023 13:15   DG Chest Port 1 View Result Date: 09/13/2023 CLINICAL DATA:  Trauma EXAM: PORTABLE CHEST - 1 VIEW COMPARISON:  None available. FINDINGS: Low lung volumes. No focal airspace consolidation, pleural effusion, or pneumothorax. No cardiomegaly. Tortuous aorta. No acute fracture or destructive lesions. Multilevel thoracic osteophytosis. Cardiac loop recorder device along the left chest wall. IMPRESSION: Low lung volumes.  Otherwise, no acute cardiopulmonary abnormality. Electronically Signed   By: Rogelia Myers M.D.   On: 09/13/2023 13:08   CT CHEST ABDOMEN PELVIS W CONTRAST Result Date: 09/13/2023 CLINICAL DATA:  Polytrauma, blunt, fall EXAM: CT CHEST, ABDOMEN, AND PELVIS WITH CONTRAST TECHNIQUE: Multidetector CT imaging of the  chest, abdomen and pelvis was performed following the standard protocol during bolus administration of intravenous contrast. RADIATION DOSE REDUCTION: This exam was performed according to the departmental dose-optimization program which includes automated exposure control, adjustment of the mA and/or kV according to patient size and/or use of iterative reconstruction technique. CONTRAST:  75mL OMNIPAQUE  IOHEXOL  350 MG/ML SOLN COMPARISON:  09/13/2023, 06/10/2023, 11/19/2021 FINDINGS: CT CHEST FINDINGS Pulmonary Embolism: While the exam was not optimized for the evaluation of the pulmonary arteries, no central pulmonary embolism visualized. Cardiovascular: No cardiomegaly or pericardial effusion.No aortic aneurysm. Multi-vessel coronary and diffuse aortic atherosclerosis. Mediastinum/Nodes: No mediastinal mass.No mediastinal, hilar, or axillary lymphadenopathy. Lungs/Pleura: The midline trachea and bronchi are patent. No focal airspace consolidation, pleural effusion, or pneumothorax. Posterior bibasilar dependent atelectasis. CT ABDOMEN PELVIS FINDINGS Hepatobiliary: No mass.Focal fatty infiltration along the falciform ligament.No  radiopaque stones or wall thickening of the gallbladder. No intrahepatic or extrahepatic biliary ductal dilation. The portal veins are patent. Pancreas: No mass or main ductal dilation. No peripancreatic inflammation or fluid collection. Spleen: Normal size. No mass. Adrenals/Urinary Tract: No adrenal masses. No renal mass. No nephrolithiasis or hydronephrosis. The urinary bladder is distended without focal abnormality. Stomach/Bowel: The stomach is decompressed without focal abnormality. No small bowel wall thickening or inflammation. No small bowel obstruction.Normal appendix. Moderate volume fecal loading throughout the colon. Scattered colonic diverticulosis. Vascular/Lymphatic: No aortic aneurysm. Diffuse aortoiliac atherosclerosis. No intraabdominal or pelvic lymphadenopathy.  Reproductive: Hysterectomy. No concerning adnexal mass.No free pelvic fluid. Other: No pneumoperitoneum, ascites, or mesenteric inflammation. Musculoskeletal: No acute fracture or destructive lesion. Diffuse osteopenia. Multilevel degenerative disc disease of the spine. Cardiac loop recorder device in the anterior left chest. IMPRESSION: No acute, traumatic injury within the chest, abdomen, and pelvis. Electronically Signed   By: Rogelia Myers M.D.   On: 09/13/2023 13:06   CT HEAD WO CONTRAST Result Date: 09/13/2023 CLINICAL DATA:  Provided history: Polytrauma, blunt. EXAM: CT HEAD WITHOUT CONTRAST CT CERVICAL SPINE WITHOUT CONTRAST TECHNIQUE: Multidetector CT imaging of the head and cervical spine was performed following the standard protocol without intravenous contrast. Multiplanar CT image reconstructions of the cervical spine were also generated. RADIATION DOSE REDUCTION: This exam was performed according to the departmental dose-optimization program which includes automated exposure control, adjustment of the mA and/or kV according to patient size and/or use of iterative reconstruction technique. COMPARISON:  Brain MRI 03/30/2023. Non-contrast head CT and CT angiogram head/neck 10/12/2022. Head FINDINGS: CT HEAD FINDINGS Brain: Mild generalized cerebral atrophy. Known chronic right MCA territory infarcts within the temporal and parietal lobes. Small chronic infarcts also again demonstrated within the bilateral cerebral hemispheric white matter and cerebellar hemispheres. Patchy and ill-defined hypoattenuation elsewhere in the cerebral white matter, nonspecific but compatible with advanced chronic small vessel ischemic disease. There is no acute intracranial hemorrhage. No acute demarcated cortical infarct. No extra-axial fluid collection. No evidence of an intracranial mass. No midline shift. Vascular: No hyperdense vessel.  Atherosclerotic calcifications. Skull: No calvarial fracture or aggressive osseous  lesion. Sinuses/Orbits: No mass or acute finding within the imaged orbits. No significant paranasal sinus disease. Other: Left anterior scalp/forehead hematoma. CT CERVICAL SPINE FINDINGS Alignment: Mild levocurvature of the cervical spine. 3 mm C3-C4 grade 1 retrolisthesis. Slight C4-C5 grade 1 retrolisthesis. Slight C7-T1 grade 1 anterolisthesis. Skull base and vertebrae: The basion-dental and atlanto-dental intervals are maintained.No evidence of acute fracture to the cervical spine. Soft tissues and spinal canal: No prevertebral fluid or swelling. No visible canal hematoma. 2 cm right thyroid  lobe nodule. Disc levels: Cervical spondylosis with multilevel disc space narrowing, disc bulges/central disc protrusions, posterior disc osteophyte complexes, endplate spurring and uncovertebral hypertrophy. Disc space narrowing is greatest at C5-C6 and C6-C7 (moderate to advanced at these levels). Ligamentum flavum calcification at C3-C4 and C4-C5. Multilevel spinal canal narrowing. Most notably at C3-C4 and C4-C5, disc bulges and ligamentum flavum calcification contribute to at least moderate spinal canal stenosis. Multilevel bony neural foraminal narrowing. Multilevel ventral osteophytes. Upper chest: No consolidation within the imaged lung apices. No visible pneumothorax. IMPRESSION: CT head: 1.  No evidence of an acute intracranial abnormality. 2. Parenchymal atrophy, chronic small vessel ischemic disease and known chronic infarcts, as described. 3. Left anterior scalp/forehead hematoma. CT cervical spine: 1. No evidence of an acute cervical spine fracture. 2. Mild levocurvature of the cervical spine. 3. Grade 1 retrolisthesis at C3-C4 and C4-C5.  4. Cervical spondylosis as described within the body of the report. At C3-C4 and C4-C5, disc bulges and ligamentum flavum calcification contribute to at least moderate spinal canal stenosis. 5. 2 cm right thyroid  lobe nodule. A non-emergent thyroid  ultrasound is recommended  for further evaluation. Reference: J Am Coll Radiol. 2015 Feb;12(2): 143-50. Electronically Signed   By: Rockey Childs D.O.   On: 09/13/2023 12:57   CT CERVICAL SPINE WO CONTRAST Result Date: 09/13/2023 CLINICAL DATA:  Provided history: Polytrauma, blunt. EXAM: CT HEAD WITHOUT CONTRAST CT CERVICAL SPINE WITHOUT CONTRAST TECHNIQUE: Multidetector CT imaging of the head and cervical spine was performed following the standard protocol without intravenous contrast. Multiplanar CT image reconstructions of the cervical spine were also generated. RADIATION DOSE REDUCTION: This exam was performed according to the departmental dose-optimization program which includes automated exposure control, adjustment of the mA and/or kV according to patient size and/or use of iterative reconstruction technique. COMPARISON:  Brain MRI 03/30/2023. Non-contrast head CT and CT angiogram head/neck 10/12/2022. Head FINDINGS: CT HEAD FINDINGS Brain: Mild generalized cerebral atrophy. Known chronic right MCA territory infarcts within the temporal and parietal lobes. Small chronic infarcts also again demonstrated within the bilateral cerebral hemispheric white matter and cerebellar hemispheres. Patchy and ill-defined hypoattenuation elsewhere in the cerebral white matter, nonspecific but compatible with advanced chronic small vessel ischemic disease. There is no acute intracranial hemorrhage. No acute demarcated cortical infarct. No extra-axial fluid collection. No evidence of an intracranial mass. No midline shift. Vascular: No hyperdense vessel.  Atherosclerotic calcifications. Skull: No calvarial fracture or aggressive osseous lesion. Sinuses/Orbits: No mass or acute finding within the imaged orbits. No significant paranasal sinus disease. Other: Left anterior scalp/forehead hematoma. CT CERVICAL SPINE FINDINGS Alignment: Mild levocurvature of the cervical spine. 3 mm C3-C4 grade 1 retrolisthesis. Slight C4-C5 grade 1 retrolisthesis. Slight  C7-T1 grade 1 anterolisthesis. Skull base and vertebrae: The basion-dental and atlanto-dental intervals are maintained.No evidence of acute fracture to the cervical spine. Soft tissues and spinal canal: No prevertebral fluid or swelling. No visible canal hematoma. 2 cm right thyroid  lobe nodule. Disc levels: Cervical spondylosis with multilevel disc space narrowing, disc bulges/central disc protrusions, posterior disc osteophyte complexes, endplate spurring and uncovertebral hypertrophy. Disc space narrowing is greatest at C5-C6 and C6-C7 (moderate to advanced at these levels). Ligamentum flavum calcification at C3-C4 and C4-C5. Multilevel spinal canal narrowing. Most notably at C3-C4 and C4-C5, disc bulges and ligamentum flavum calcification contribute to at least moderate spinal canal stenosis. Multilevel bony neural foraminal narrowing. Multilevel ventral osteophytes. Upper chest: No consolidation within the imaged lung apices. No visible pneumothorax. IMPRESSION: CT head: 1.  No evidence of an acute intracranial abnormality. 2. Parenchymal atrophy, chronic small vessel ischemic disease and known chronic infarcts, as described. 3. Left anterior scalp/forehead hematoma. CT cervical spine: 1. No evidence of an acute cervical spine fracture. 2. Mild levocurvature of the cervical spine. 3. Grade 1 retrolisthesis at C3-C4 and C4-C5. 4. Cervical spondylosis as described within the body of the report. At C3-C4 and C4-C5, disc bulges and ligamentum flavum calcification contribute to at least moderate spinal canal stenosis. 5. 2 cm right thyroid  lobe nodule. A non-emergent thyroid  ultrasound is recommended for further evaluation. Reference: J Am Coll Radiol. 2015 Feb;12(2): 143-50. Electronically Signed   By: Rockey Childs D.O.   On: 09/13/2023 12:57    ZXH:dpwld rhythm with first degree AVB, rate 70 bpm (personally reviewed)  TELEMETRY: sinus rhythm, first degree AVB with rates 60s-70s (personally reviewed)  DEVICE  HISTORY:  ILR by Dr. Kelsie 01/20/16 ILR by Dr. Nancey 06/29/22  Assessment/Plan:  Suspected syncope with fall ILR interrogation reveals bradycardia/prolonged pause. Because of her programming prioritizing afib monitoring, full event not able to be saved by her device. No other arrhythmia seen on ILR.  Suspicion for vaso-vagal event is high given decreasing HR on device prior to event as well as low BP on EMS arrival and in the ED. Additionally patient is without prior history of brady-arrhythmia or heart block. No abnormal telemetry since admission. No indication for PPM at this time. Will continue to monitor monthly ILR reports.   Hypertension Stable this admission. Management per primary team  CAD Hyperlipidemia Continue home statin. Plavix  held by primary team pending EP evaluation for PPM. Okay to resume.  Hx cryptogenic stroke Patient on Plavix  at baseline.    For questions or updates, please contact [facility] Please consult [email or web address] for contact info under     Signed, Artist Pouch, PA-C  4:08 PM

## 2023-09-16 DIAGNOSIS — R55 Syncope and collapse: Secondary | ICD-10-CM

## 2023-09-16 DIAGNOSIS — W108XXA Fall (on) (from) other stairs and steps, initial encounter: Secondary | ICD-10-CM | POA: Diagnosis not present

## 2023-09-16 DIAGNOSIS — E1122 Type 2 diabetes mellitus with diabetic chronic kidney disease: Secondary | ICD-10-CM | POA: Diagnosis not present

## 2023-09-16 DIAGNOSIS — I10 Essential (primary) hypertension: Secondary | ICD-10-CM | POA: Diagnosis not present

## 2023-09-16 LAB — BASIC METABOLIC PANEL WITH GFR
Anion gap: 10 (ref 5–15)
BUN: 22 mg/dL (ref 8–23)
CO2: 22 mmol/L (ref 22–32)
Calcium: 9.6 mg/dL (ref 8.9–10.3)
Chloride: 110 mmol/L (ref 98–111)
Creatinine, Ser: 1.19 mg/dL — ABNORMAL HIGH (ref 0.44–1.00)
GFR, Estimated: 47 mL/min — ABNORMAL LOW (ref >60)
Glucose, Bld: 117 mg/dL — ABNORMAL HIGH (ref 70–99)
Potassium: 4.4 mmol/L (ref 3.5–5.1)
Sodium: 142 mmol/L (ref 135–145)

## 2023-09-16 LAB — CBC
HCT: 30.1 % — ABNORMAL LOW (ref 36.0–46.0)
Hemoglobin: 9.7 g/dL — ABNORMAL LOW (ref 12.0–15.0)
MCH: 30.9 pg (ref 26.0–34.0)
MCHC: 32.2 g/dL (ref 30.0–36.0)
MCV: 95.9 fL (ref 80.0–100.0)
Platelets: 264 K/uL (ref 150–400)
RBC: 3.14 MIL/uL — ABNORMAL LOW (ref 3.87–5.11)
RDW: 12.8 % (ref 11.5–15.5)
WBC: 6.8 K/uL (ref 4.0–10.5)
nRBC: 0 % (ref 0.0–0.2)

## 2023-09-16 LAB — GLUCOSE, CAPILLARY
Glucose-Capillary: 116 mg/dL — ABNORMAL HIGH (ref 70–99)
Glucose-Capillary: 245 mg/dL — ABNORMAL HIGH (ref 70–99)
Glucose-Capillary: 96 mg/dL (ref 70–99)

## 2023-09-16 NOTE — Discharge Summary (Signed)
 Physician Discharge Summary   Patient: Sarah [name] MRN: [id number] DOB: 01/01/1946  Admit date:     09/13/2023  Discharge date: 09/16/2023  Discharge Physician: Concepcion Riser   PCP: [place], [name], MD   Recommendations at discharge:    PCP follow up in 1 week. Cardiology follow up as scheduled.  Discharge Diagnoses: Principal Problem:   Fall down stairs Active Problems:   HTN (hypertension)   HLD (hyperlipidemia)   DM (diabetes mellitus) (HCC)   CKD (chronic kidney disease) stage 2, GFR 60-89 ml/min   History of CVA (cerebrovascular accident)   Carotid arterial disease (HCC)   CAD (coronary artery disease)   Anemia  Resolved Problems:   * No resolved hospital problems. *  Hospital Course: Sarah [name] is a 78 y.o. female with medical history significant of hypertension, hyperlipidemia, diabetes, CKD 2, CVA, carotid artery disease, CAD, GERD, anemia, anxiety presenting after a fall downstairs at home. Imaging studies negative for acute pathology admitted to TRH service for further management.   Assessment and Plan: S/p Fall  Concussion injury Possible syncopal episode- Imaging studies unremarkable. Trauma surgery signed off. She did complain of palpitations during fall episode, she has loop recorder. Loop recorder interrogated showed long pauses.  EP consulted, recordings reviewed, do not think cardiac related syncope, possibly vagal episode. PT/ OT advised HH services, BSC. Advised PCP and cardiology follow up as scheduled.   Hypertension Continue home amlodipine , losartan    Hyperlipidemia Continue home Zetia  and rosuvastatin    Type 2 Diabetes Resumed Farxiga , trulicity  at home.   CKD 2 Prior creatinine baseline 0.8 to 1.2.   renal function stable. Trend renal function and electrolytes as outpatient.   History of CVA Carotid artery disease Resumed Plavix , Zetia  and rosuvastatin    CAD Continue Plavix , Zetia  and rosuvastatin .   Chronic  Anemia Hemoglobin stable at baseline.          Consultants: EP Procedures performed: none  Disposition: Home health Diet recommendation:  Discharge Diet Orders (From admission, onward)     Start     Ordered   09/16/23 0000  Diet - low sodium heart healthy        09/16/23 1033   09/16/23 0000  Diet Carb Modified        09/16/23 1033           Cardiac and Carb modified diet DISCHARGE MEDICATION: Allergies as of 09/16/2023   Not on File      Medication List     STOP taking these medications    gabapentin  100 MG capsule Commonly known as: NEURONTIN        TAKE these medications    amLODipine  5 MG tablet Commonly known as: NORVASC  Take 5 mg by mouth daily.   clopidogrel  75 MG tablet Commonly known as: PLAVIX  Take 75 mg by mouth daily.   ezetimibe  10 MG tablet Commonly known as: ZETIA  Take 10 mg by mouth daily.   Farxiga  10 MG Tabs tablet Generic drug: dapagliflozin  propanediol Take 10 mg by mouth daily.   latanoprost  0.005 % ophthalmic solution Commonly known as: XALATAN  Place 1 drop into both eyes at bedtime.   losartan  50 MG tablet Commonly known as: COZAAR  Take 50 mg by mouth daily.   ondansetron  4 MG tablet Commonly known as: ZOFRAN  Take 4 mg by mouth 2 (two) times daily as needed.   rosuvastatin  20 MG tablet Commonly known as: CRESTOR  Take 20 mg by mouth at bedtime.   sertraline  50 MG tablet Commonly known  as: ZOLOFT  Take 50 mg by mouth daily.   topiramate  25 MG tablet Commonly known as: TOPAMAX  Take 25 mg by mouth at bedtime.   Trulicity  1.5 MG/0.5ML Soaj Generic drug: Dulaglutide  Inject 1.5 mg into the skin once a week.               Durable Medical Equipment  (From admission, onward)           Start     Ordered   09/14/23 1343  For home use only DME Bedside commode  Once       Question:  Patient needs a bedside commode to treat with the following condition  Answer:  Weakness   09/14/23 1342             Follow-up Information     [facility] Follow up.   Why: Le[facility]o will contact you for the first home visit Contact information: [address] [address] [phone]                Discharge Exam: Filed Weights   09/13/23 1209  Weight: 46.9 kg      09/16/2023   12:19 PM 09/16/2023    8:36 AM 09/16/2023    3:42 AM  Vitals with BMI  Systolic 135 148 862  Diastolic 62 64 67  Pulse 83 65 68    General - Elderly Caucasian female, no apparent distress HEENT - PERRLA, EOMI, atraumatic head, non tender sinuses. Lung - Clear, no rales, rhonchi, wheezes. Heart - S1, S2 heard, + murmur, no rubs, trace pedal edema. Abdomen - Soft, non tender, bowel sounds good Neuro - Alert, awake and oriented, non focal exam. Skin - Warm and dry.  Condition at discharge: stable  The results of significant diagnostics from this hospitalization (including imaging, microbiology, ancillary and laboratory) are listed below for reference.   Imaging Studies: DG Pelvis Portable Result Date: 09/13/2023 CLINICAL DATA:  Trauma EXAM: PORTABLE PELVIS 1-2 VIEWS COMPARISON:  November 19, 2021 FINDINGS: No evidence of pelvic fracture or diastasis.No acute hip fracture or dislocation.There is no evidence of arthropathy or other focal bone abnormality.Soft tissues are unremarkable. IMPRESSION: No acute fracture, pelvic bone diastasis, or dislocation. Electronically Signed   By: Rogelia Myers M.D.   On: 09/13/2023 13:15   DG Chest Port 1 View Result Date: 09/13/2023 CLINICAL DATA:  Trauma EXAM: PORTABLE CHEST - 1 VIEW COMPARISON:  None available. FINDINGS: Low lung volumes. No focal airspace consolidation, pleural effusion, or pneumothorax. No cardiomegaly. Tortuous aorta. No acute fracture or destructive lesions. Multilevel thoracic osteophytosis. Cardiac loop recorder device along the left chest wall. IMPRESSION: Low lung volumes.  Otherwise, no acute cardiopulmonary abnormality.  Electronically Signed   By: Rogelia Myers M.D.   On: 09/13/2023 13:08   CT CHEST ABDOMEN PELVIS W CONTRAST Result Date: 09/13/2023 CLINICAL DATA:  Polytrauma, blunt, fall EXAM: CT CHEST, ABDOMEN, AND PELVIS WITH CONTRAST TECHNIQUE: Multidetector CT imaging of the chest, abdomen and pelvis was performed following the standard protocol during bolus administration of intravenous contrast. RADIATION DOSE REDUCTION: This exam was performed according to the departmental dose-optimization program which includes automated exposure control, adjustment of the mA and/or kV according to patient size and/or use of iterative reconstruction technique. CONTRAST:  75mL OMNIPAQUE  IOHEXOL  350 MG/ML SOLN COMPARISON:  09/13/2023, 06/10/2023, 11/19/2021 FINDINGS: CT CHEST FINDINGS Pulmonary Embolism: While the exam was not optimized for the evaluation of the pulmonary arteries, no central pulmonary embolism visualized. Cardiovascular: No  cardiomegaly or pericardial effusion.No aortic aneurysm. Multi-vessel coronary and diffuse aortic atherosclerosis. Mediastinum/Nodes: No mediastinal mass.No mediastinal, hilar, or axillary lymphadenopathy. Lungs/Pleura: The midline trachea and bronchi are patent. No focal airspace consolidation, pleural effusion, or pneumothorax. Posterior bibasilar dependent atelectasis. CT ABDOMEN PELVIS FINDINGS Hepatobiliary: No mass.Focal fatty infiltration along the falciform ligament.No radiopaque stones or wall thickening of the gallbladder. No intrahepatic or extrahepatic biliary ductal dilation. The portal veins are patent. Pancreas: No mass or main ductal dilation. No peripancreatic inflammation or fluid collection. Spleen: Normal size. No mass. Adrenals/Urinary Tract: No adrenal masses. No renal mass. No nephrolithiasis or hydronephrosis. The urinary bladder is distended without focal abnormality. Stomach/Bowel: The stomach is decompressed without focal abnormality. No small bowel wall thickening or  inflammation. No small bowel obstruction.Normal appendix. Moderate volume fecal loading throughout the colon. Scattered colonic diverticulosis. Vascular/Lymphatic: No aortic aneurysm. Diffuse aortoiliac atherosclerosis. No intraabdominal or pelvic lymphadenopathy. Reproductive: Hysterectomy. No concerning adnexal mass.No free pelvic fluid. Other: No pneumoperitoneum, ascites, or mesenteric inflammation. Musculoskeletal: No acute fracture or destructive lesion. Diffuse osteopenia. Multilevel degenerative disc disease of the spine. Cardiac loop recorder device in the anterior left chest. IMPRESSION: No acute, traumatic injury within the chest, abdomen, and pelvis. Electronically Signed   By: Rogelia Myers M.D.   On: 09/13/2023 13:06   CT HEAD WO CONTRAST Result Date: 09/13/2023 CLINICAL DATA:  Provided history: Polytrauma, blunt. EXAM: CT HEAD WITHOUT CONTRAST CT CERVICAL SPINE WITHOUT CONTRAST TECHNIQUE: Multidetector CT imaging of the head and cervical spine was performed following the standard protocol without intravenous contrast. Multiplanar CT image reconstructions of the cervical spine were also generated. RADIATION DOSE REDUCTION: This exam was performed according to the departmental dose-optimization program which includes automated exposure control, adjustment of the mA and/or kV according to patient size and/or use of iterative reconstruction technique. COMPARISON:  Brain MRI 03/30/2023. Non-contrast head CT and CT angiogram head/neck 10/12/2022. Head FINDINGS: CT HEAD FINDINGS Brain: Mild generalized cerebral atrophy. Known chronic right MCA territory infarcts within the temporal and parietal lobes. Small chronic infarcts also again demonstrated within the bilateral cerebral hemispheric white matter and cerebellar hemispheres. Patchy and ill-defined hypoattenuation elsewhere in the cerebral white matter, nonspecific but compatible with advanced chronic small vessel ischemic disease. There is no acute  intracranial hemorrhage. No acute demarcated cortical infarct. No extra-axial fluid collection. No evidence of an intracranial mass. No midline shift. Vascular: No hyperdense vessel.  Atherosclerotic calcifications. Skull: No calvarial fracture or aggressive osseous lesion. Sinuses/Orbits: No mass or acute finding within the imaged orbits. No significant paranasal sinus disease. Other: Left anterior scalp/forehead hematoma. CT CERVICAL SPINE FINDINGS Alignment: Mild levocurvature of the cervical spine. 3 mm C3-C4 grade 1 retrolisthesis. Slight C4-C5 grade 1 retrolisthesis. Slight C7-T1 grade 1 anterolisthesis. Skull base and vertebrae: The basion-dental and atlanto-dental intervals are maintained.No evidence of acute fracture to the cervical spine. Soft tissues and spinal canal: No prevertebral fluid or swelling. No visible canal hematoma. 2 cm right thyroid  lobe nodule. Disc levels: Cervical spondylosis with multilevel disc space narrowing, disc bulges/central disc protrusions, posterior disc osteophyte complexes, endplate spurring and uncovertebral hypertrophy. Disc space narrowing is greatest at C5-C6 and C6-C7 (moderate to advanced at these levels). Ligamentum flavum calcification at C3-C4 and C4-C5. Multilevel spinal canal narrowing. Most notably at C3-C4 and C4-C5, disc bulges and ligamentum flavum calcification contribute to at least moderate spinal canal stenosis. Multilevel bony neural foraminal narrowing. Multilevel ventral osteophytes. Upper chest: No consolidation within the imaged lung apices. No visible pneumothorax. IMPRESSION: CT head: 1.  No evidence of an acute intracranial abnormality. 2. Parenchymal atrophy, chronic small vessel ischemic disease and known chronic infarcts, as described. 3. Left anterior scalp/forehead hematoma. CT cervical spine: 1. No evidence of an acute cervical spine fracture. 2. Mild levocurvature of the cervical spine. 3. Grade 1 retrolisthesis at C3-C4 and C4-C5. 4.  Cervical spondylosis as described within the body of the report. At C3-C4 and C4-C5, disc bulges and ligamentum flavum calcification contribute to at least moderate spinal canal stenosis. 5. 2 cm right thyroid  lobe nodule. A non-emergent thyroid  ultrasound is recommended for further evaluation. Reference: J Am Coll Radiol. 2015 Feb;12(2): 143-50. Electronically Signed   By: Rockey Childs D.O.   On: 09/13/2023 12:57   CT CERVICAL SPINE WO CONTRAST Result Date: 09/13/2023 CLINICAL DATA:  Provided history: Polytrauma, blunt. EXAM: CT HEAD WITHOUT CONTRAST CT CERVICAL SPINE WITHOUT CONTRAST TECHNIQUE: Multidetector CT imaging of the head and cervical spine was performed following the standard protocol without intravenous contrast. Multiplanar CT image reconstructions of the cervical spine were also generated. RADIATION DOSE REDUCTION: This exam was performed according to the departmental dose-optimization program which includes automated exposure control, adjustment of the mA and/or kV according to patient size and/or use of iterative reconstruction technique. COMPARISON:  Brain MRI 03/30/2023. Non-contrast head CT and CT angiogram head/neck 10/12/2022. Head FINDINGS: CT HEAD FINDINGS Brain: Mild generalized cerebral atrophy. Known chronic right MCA territory infarcts within the temporal and parietal lobes. Small chronic infarcts also again demonstrated within the bilateral cerebral hemispheric white matter and cerebellar hemispheres. Patchy and ill-defined hypoattenuation elsewhere in the cerebral white matter, nonspecific but compatible with advanced chronic small vessel ischemic disease. There is no acute intracranial hemorrhage. No acute demarcated cortical infarct. No extra-axial fluid collection. No evidence of an intracranial mass. No midline shift. Vascular: No hyperdense vessel.  Atherosclerotic calcifications. Skull: No calvarial fracture or aggressive osseous lesion. Sinuses/Orbits: No mass or acute finding  within the imaged orbits. No significant paranasal sinus disease. Other: Left anterior scalp/forehead hematoma. CT CERVICAL SPINE FINDINGS Alignment: Mild levocurvature of the cervical spine. 3 mm C3-C4 grade 1 retrolisthesis. Slight C4-C5 grade 1 retrolisthesis. Slight C7-T1 grade 1 anterolisthesis. Skull base and vertebrae: The basion-dental and atlanto-dental intervals are maintained.No evidence of acute fracture to the cervical spine. Soft tissues and spinal canal: No prevertebral fluid or swelling. No visible canal hematoma. 2 cm right thyroid  lobe nodule. Disc levels: Cervical spondylosis with multilevel disc space narrowing, disc bulges/central disc protrusions, posterior disc osteophyte complexes, endplate spurring and uncovertebral hypertrophy. Disc space narrowing is greatest at C5-C6 and C6-C7 (moderate to advanced at these levels). Ligamentum flavum calcification at C3-C4 and C4-C5. Multilevel spinal canal narrowing. Most notably at C3-C4 and C4-C5, disc bulges and ligamentum flavum calcification contribute to at least moderate spinal canal stenosis. Multilevel bony neural foraminal narrowing. Multilevel ventral osteophytes. Upper chest: No consolidation within the imaged lung apices. No visible pneumothorax. IMPRESSION: CT head: 1.  No evidence of an acute intracranial abnormality. 2. Parenchymal atrophy, chronic small vessel ischemic disease and known chronic infarcts, as described. 3. Left anterior scalp/forehead hematoma. CT cervical spine: 1. No evidence of an acute cervical spine fracture. 2. Mild levocurvature of the cervical spine. 3. Grade 1 retrolisthesis at C3-C4 and C4-C5. 4. Cervical spondylosis as described within the body of the report. At C3-C4 and C4-C5, disc bulges and ligamentum flavum calcification contribute to at least moderate spinal canal stenosis. 5. 2 cm right thyroid  lobe nodule. A non-emergent thyroid  ultrasound is recommended for further evaluation.  Reference: J Am Coll  Radiol. 2015 Feb;12(2): 143-50. Electronically Signed   By: Rockey Childs D.O.   On: 09/13/2023 12:57    Microbiology: No results found for this or any previous visit.  Labs: CBC: Recent Labs  Lab 09/13/23 1227 09/13/23 1241 09/14/23 0338 09/15/23 0304 09/16/23 0322  WBC 7.3  --  9.4 6.5 6.8  HGB 10.6* 10.9*  10.9* 9.9* 9.3* 9.7*  HCT 35.0* 32.0*  32.0* 30.8* 29.4* 30.1*  MCV 103.6*  --  98.7 98.3 95.9  PLT 300  --  246 265 264   Basic Metabolic Panel: Recent Labs  Lab 09/13/23 1241 09/14/23 0338 09/15/23 0304 09/16/23 0322  NA 142  142 139 140 142  K 4.1  4.1 4.3 4.6 4.4  CL 111  111 109 109 110  CO2  --  24 24 22   GLUCOSE 68*  68* 113* 130* 117*  BUN 20  20 18 22 22   CREATININE 1.00  1.00 1.22* 1.28* 1.19*  CALCIUM   --  9.4 9.4 9.6  MG  --   --  2.2  --   PHOS  --   --  4.3  --    Liver Function Tests: Recent Labs  Lab 09/14/23 0338  AST 24  ALT 26  ALKPHOS 58  BILITOT 0.4  PROT 5.7*  ALBUMIN 2.7*   CBG: Recent Labs  Lab 09/14/23 2116 09/15/23 0651 09/15/23 1211 09/15/23 1627 09/16/23 0655  GLUCAP 186* 120* 147* 80 116*    Discharge time spent: 35 minutes.  Signed: Concepcion Riser, MD [facility] 09/16/2023

## 2023-09-16 NOTE — TOC Transition Note (Signed)
 Transition of Care [facility]) - Discharge Note   Patient Details  Name: Sarah [name] MRN: [id number] Date of Birth: 1945/09/29  [facility] CM/SW Contact:  Andrez JULIANNA George, RN Phone Number: 09/16/2023, 11:14 AM   Clinical Narrative:     Pt is discharging home with [facility] services through [facility]. Information on the AVS. [facility] will contact her for the first home visit.  BSC ordered through [facility] and will be delivered to the room.  Pt has transportation home.   Final next level of care: Home w Home Health Services Barriers to Discharge: No Barriers Identified   Patient Goals and CMS Choice   CMS Medicare.gov Compare Post Acute Care list provided to:: Patient Choice offered to / list presented to : Patient      Discharge Placement                       Discharge Plan and Services Additional resources added to the After Visit Summary for     Discharge Planning Services: CM Consult Post Acute Care Choice: Home Health          DME Arranged: Bedside commode DME Agency: [facility] Date DME Agency Contacted: 09/16/23   Representative spoke with at DME Agency: Darlyn HH Arranged: PT, OT [facility] Agency: [facility] Date [facility] Agency Contacted: 09/14/23   Representative spoke with at [facility] Agency: Adriana  Social Drivers of Health (SDOH) Interventions SDOH Screenings   Food Insecurity: No Food Insecurity (09/14/2023)  Housing: Low Risk  (09/14/2023)  Transportation Needs: No Transportation Needs (09/14/2023)  Utilities: Not At Risk (09/14/2023)  Social Connections: Unknown (09/14/2023)     Readmission Risk Interventions     No data to display

## 2023-09-16 NOTE — Plan of Care (Signed)
 progressing

## 2023-09-16 NOTE — Progress Notes (Signed)
 Physical Therapy Treatment Patient Details Name: Sarah [name] MRN: [id number] DOB: August 23, 1945 Today's Date: 09/16/2023   History of Present Illness 78 y.o. female presents to [facility] 09/13/23 after falling down stairs. No acute abnormality of imaging. Pt with scalp hematoma. PMHx: hypertension, hyperlipidemia, diabetes, CKD 2, CVA, carotid artery disease, CAD, GERD, anemia, anxiety    PT Comments  Pt received in supine and agreeable to session. Pt attempting to use the call bell as a phone upon entry, so pt assisted with calling in breakfast order. Pt requests to use the bathroom at the beginning of the session and demonstrates urinary incontinence before sitting on the toilet. Pt able to stand at the sink for hygiene tasks without UE support with no LOB. Pt able to perform gait and stair trials with CGA-min A for balance. Pt demonstrates low foot clearance during ambulation despite frequent cues and education on reducing fall risk. Pt continues to benefit from PT services to progress toward functional mobility goals.     If plan is discharge home, recommend the following: A lot of help with walking and/or transfers;A lot of help with bathing/dressing/bathroom;Direct supervision/assist for medications management;Direct supervision/assist for financial management;Assist for transportation;Help with stairs or ramp for entrance   Can travel by private vehicle        Equipment Recommendations  BSC/3in1    Recommendations for Other Services       Precautions / Restrictions Precautions Precautions: Fall Recall of Precautions/Restrictions: Impaired Restrictions Weight Bearing Restrictions Per Provider Order: No     Mobility  Bed Mobility Overal bed mobility: Needs Assistance Bed Mobility: Supine to Sit     Supine to sit: Supervision, Used rails     General bed mobility comments: increased time and use of bed rails    Transfers Overall transfer level: Needs assistance Equipment  used: Rolling walker (2 wheels) Transfers: Sit to/from Stand Sit to Stand: Supervision           General transfer comment: supervision for safety    Ambulation/Gait Ambulation/Gait assistance: Contact guard assist Gait Distance (Feet): 150 Feet Assistive device: Rolling walker (2 wheels) Gait Pattern/deviations: Step-through pattern, Trunk flexed Gait velocity: decr     General Gait Details: cues for awareness and increased step length and height as pt would occasionally slide feet forward (L>R), but pt demonstrates little improvement   Stairs Stairs: Yes Stairs assistance: Min assist Stair Management: Two rails, Step to pattern, Forwards Number of Stairs: 2 General stair comments: cues for sequencing and min A during descent for stability due to weakness   Wheelchair Mobility     Tilt Bed    Modified Rankin (Stroke Patients Only)       Balance Overall balance assessment: Needs assistance Sitting-balance support: No upper extremity supported, Feet supported Sitting balance-Leahy Scale: Good Sitting balance - Comments: sitting EOB   Standing balance support: Bilateral upper extremity supported, No upper extremity supported, During functional activity Standing balance-Leahy Scale: Fair Standing balance comment: able to stand at sink for hygiene tasks without UE support, but requires UE support during ambulation                            Communication Communication Communication: No apparent difficulties  Cognition Arousal: Alert Behavior During Therapy: Flat affect   PT - Cognitive impairments: No family/caregiver present to determine baseline, Awareness, Sequencing, Problem solving, Safety/Judgement  Following commands: Impaired Following commands impaired: Follows multi-step commands inconsistently, Follows one step commands with increased time    Cueing Cueing Techniques: Verbal cues, Tactile cues, Visual  cues  Exercises      General Comments General comments (skin integrity, edema, etc.): Pt noted to be attempting to use tv remote as phone upon entry requiring cues and assist to use the phone to call in breakfast order. Question cognition vs. vision impairment      Pertinent Vitals/Pain Pain Assessment Pain Assessment: Faces Faces Pain Scale: Hurts a little bit Pain Location: headache Pain Descriptors / Indicators: Headache Pain Intervention(s): Monitored during session     PT Goals (current goals can now be found in the care plan section) Acute Rehab PT Goals Patient Stated Goal: to go home PT Goal Formulation: With patient Time For Goal Achievement: 09/28/23 Progress towards PT goals: Progressing toward goals    Frequency    Min 2X/week       AM-PAC PT 6 Clicks Mobility   Outcome Measure  Help needed turning from your back to your side while in a flat bed without using bedrails?: A Little Help needed moving from lying on your back to sitting on the side of a flat bed without using bedrails?: A Little Help needed moving to and from a bed to a chair (including a wheelchair)?: A Little Help needed standing up from a chair using your arms (e.g., wheelchair or bedside chair)?: A Little Help needed to walk in hospital room?: A Little Help needed climbing 3-5 steps with a railing? : A Little 6 Click Score: 18    End of Session Equipment Utilized During Treatment: Gait belt Activity Tolerance: Patient tolerated treatment well Patient left: in chair;with call bell/phone within reach;with chair alarm set Nurse Communication: Mobility status PT Visit Diagnosis: Unsteadiness on feet (R26.81);Other abnormalities of gait and mobility (R26.89);Muscle weakness (generalized) (M62.81);History of falling (Z91.81)     Time: 9090-9057 PT Time Calculation (min) (ACUTE ONLY): 33 min  Charges:    $Gait Training: 8-22 mins $Therapeutic Activity: 8-22 mins PT General Charges $$  ACUTE PT VISIT: 1 Visit                     Darryle George, PTA Acute Rehabilitation Services Secure Chat Preferred  [phone]    Darryle George 09/16/2023, 11:41 AM

## 2023-09-19 ENCOUNTER — Telehealth: Payer: Self-pay | Admitting: *Deleted

## 2023-09-19 ENCOUNTER — Encounter: Payer: Self-pay | Admitting: Family Medicine

## 2023-09-19 ENCOUNTER — Telehealth: Payer: Self-pay | Admitting: Family

## 2023-09-19 NOTE — Telephone Encounter (Signed)
 Patient called and wanted to know why there was a no show on 09/14/2023, when she called and cancelled on 9/2 because of a fall down the stairs and hospitalized til that Friday. She said someone said that if wasn't in the chart at the time so she didn't worry about it and didn't want to get charged for it. CB#[phone]

## 2023-09-19 NOTE — Telephone Encounter (Signed)
 Copied from CRM [id number]. Topic: Clinical - Home Health Verbal Orders >> Sep 19, 2023  3:06 PM Dedra B wrote: Caller/Agency: Charisse from [facility] Number: [phone] Service Requested: Physical Therapy  Frequency: 1x/wk for 1 wk, 2x/wk for 3wk, and 1x/wk for 5wks Any new concerns about the patient? Yes, pt had a fall and is declining OT referral from hospital. She still complains of headaches and still has bruise on L eye area and L knee from the fall. She gets dizzy when standing and feels sore all over.

## 2023-09-20 NOTE — Telephone Encounter (Signed)
It is okay to give verbal authorization for requested services. Thanks, BJ 

## 2023-09-21 ENCOUNTER — Ambulatory Visit: Admitting: Family Medicine

## 2023-09-21 ENCOUNTER — Encounter: Payer: Self-pay | Admitting: Family Medicine

## 2023-09-21 VITALS — BP 150/80 | HR 78 | Temp 98.2°F | Resp 16 | Ht 60.0 in | Wt 104.8 lb

## 2023-09-21 DIAGNOSIS — I739 Peripheral vascular disease, unspecified: Secondary | ICD-10-CM

## 2023-09-21 DIAGNOSIS — I10 Essential (primary) hypertension: Secondary | ICD-10-CM | POA: Diagnosis not present

## 2023-09-21 DIAGNOSIS — M79605 Pain in left leg: Secondary | ICD-10-CM

## 2023-09-21 DIAGNOSIS — W19XXXD Unspecified fall, subsequent encounter: Secondary | ICD-10-CM

## 2023-09-21 NOTE — Patient Instructions (Addendum)
 A few things to remember from today's visit:  Primary hypertension Monitor blood pressure at home. No changes today. Tylenol  500 mg 2-3 times for headache. Keep appt with neuro and cardiologist.  If you need refills for medications you take chronically, please call your pharmacy. Do not use My Chart to request refills or for acute issues that need immediate attention. If you send a my chart message, it may take a few days to be addressed, specially if I am not in the office.  Please be sure medication list is accurate. If a new problem present, please set up appointment sooner than planned today.

## 2023-09-21 NOTE — Telephone Encounter (Signed)
 Contacted Sarah [name] and inform her of approval from provider. No further action is needed.

## 2023-09-21 NOTE — Assessment & Plan Note (Signed)
 BP today mildly elevated, she is not monitoring BP's at home, recommend doing so. Continue Losartan  50 mg daily and Amlodipine  5 mg daily as well as low salt diet.

## 2023-09-21 NOTE — Progress Notes (Signed)
 Chief Complaint  Patient presents with   Hospitalization Follow-up    09/13/2023 - 09/16/2023 (3 days) [facility]  Fall down stairs Principal problem      Discussed the use of AI scribe software for clinical note transcription with the patient, who gave verbal consent to proceed.  History of Present Illness Sarah [name] is a 78 year old female with a PMHx significant for CAD, HTN, CVA, DM II, CKD II, anxiety, PAD, and GERD,  who presents for follow-up after a recent fall and hospitalization.  She was hospitalized from September 2nd to September 5th following a fall down sixteen steps, resulting in significant trauma.  Question of syncopal episode. During her hospital stay, she was evaluated by a cardiologist and EP; she had a cardiac loop recorder placed.   Chest,abdomen/pelvic CT on 09/13/23: No acute, traumatic injury within the chest, abdomen, and pelvis.   She was discharged five days ago and has since been receiving home health services, including physical therapy, which started with one session and will increase to twice a week.  Chronic medications were not change during her hospital stay.   Her appetite is fine since discharge. She uses a walker for mobility.  She reports persistent left leg pain, which radiates from her hip downwards and occurs both at rest and during activity, including at night, disrupting her sleep. She had an ABI done on 08/30/23 but is unsure if someone has called her with the results.   Right: Resting right ankle-brachial index indicates noncompressible right lower extremity arteries.  The right toe-brachial index is noncompressible.  Left: Resting left ankle-brachial index indicates noncompressible left lower extremity arteries. The left toe-brachial index is normal.   CAD, CVA, PAD: She is currently taking rosuvastatin  20 mg daily, aspirin  81 mg daily, Plavix  75 mg daily, and Zetia  10 mg daily. Lab Results  Component Value  Date   CHOL 139 10/12/2022   HDL 79 10/12/2022   LDLCALC 44 10/12/2022   TRIG 79 10/12/2022   CHOLHDL 1.8 10/12/2022   She experiences fatigue and headaches, which she attributes to the fall. The headaches are slowly improving. She also reports dizziness, particularly in the morning, described as the room spinning.  She is not currently taking any medication for dizziness, which she has had intermittently for sometime.  HTN: She is on Losartan  50 mg daily and Amlodipine  5 mg daily. She has not been checking her blood pressure at home.  No CP, SOB,orthopnea,PND, new focal neurologic deficit,or worsening edema. CKD II: Follows with nephrologist.  Lab Results  Component Value Date   NA 142 09/16/2023   CL 110 09/16/2023   K 4.4 09/16/2023   CO2 22 09/16/2023   BUN 22 09/16/2023   CREATININE 1.19 (H) 09/16/2023   GFRNONAA 47 (L) 09/16/2023   CALCIUM  9.6 09/16/2023   PHOS 4.3 09/15/2023   ALBUMIN 2.7 (L) 09/14/2023   GLUCOSE 117 (H) 09/16/2023   Lab Results  Component Value Date   WBC 6.8 09/16/2023   HGB 9.7 (L) 09/16/2023   HCT 30.1 (L) 09/16/2023   MCV 95.9 09/16/2023   PLT 264 09/16/2023   Review of Systems  Constitutional:  Positive for fatigue. Negative for activity change, appetite change, chills and fever.  Gastrointestinal:  Negative for abdominal pain, nausea and vomiting.  Endocrine: Negative for cold intolerance and heat intolerance.  Genitourinary:  Negative for decreased urine volume, dysuria and hematuria.  Musculoskeletal:  Positive for arthralgias, back pain and gait problem.  Skin:  Negative for rash.  Psychiatric/Behavioral:  Negative for confusion and hallucinations.   See other pertinent positives and negatives in HPI.  Current Outpatient Medications on File Prior to Visit  Medication Sig Dispense Refill   amLODipine  (NORVASC ) 5 MG tablet Take 5 mg by mouth daily.     aspirin  EC 81 MG tablet Take 1 tablet (81 mg total) by mouth daily. Swallow whole.  30 tablet 12   cholecalciferol (VITAMIN D3) 25 MCG (1000 UNIT) tablet Take 1,000 Units by mouth daily.     clopidogrel  (PLAVIX ) 75 MG tablet Take 75 mg by mouth daily.     Continuous Glucose Receiver (DEXCOM G7 RECEIVER) DEVI USE TO MONITOR GLUCOSE 1 each 0   Continuous Glucose Sensor (DEXCOM G7 SENSOR) MISC USE AS DIRECTED 9 each 3   dapagliflozin  propanediol (FARXIGA ) 10 MG TABS tablet TAKE 1 TABLET BY MOUTH DAILY 90 tablet 1   ezetimibe  (ZETIA ) 10 MG tablet Take 10 mg by mouth daily.     FARXIGA  10 MG TABS tablet Take 10 mg by mouth daily.     furosemide  (LASIX ) 20 MG tablet Take 1 tablet by mouth daily (only as needed) 90 tablet 3   insulin  glargine, 1 Unit Dial , (TOUJEO  SOLOSTAR) 300 UNIT/ML Solostar Pen Inject 14 Units into the skin daily in the afternoon. 15 mL 3   insulin  lispro (HUMALOG  KWIKPEN) 100 UNIT/ML KwikPen Max daily 30 units (Patient taking differently: Inject 6 Units into the skin 3 (three) times daily. Max daily 30 units) 30 mL 3   latanoprost  (XALATAN ) 0.005 % ophthalmic solution Place 1 drop into both eyes at bedtime.     losartan  (COZAAR ) 50 MG tablet Take 50 mg by mouth daily.     Magnesium 250 MG TABS Take 250 mg by mouth daily.     nitroGLYCERIN  (NITROSTAT ) 0.4 MG SL tablet Place 1 tablet (0.4 mg total) under the tongue every 5 (five) minutes as needed for chest pain. one tab every 5 minutes up to 3 tablets total over 15 minutes. 25 tablet 3   ondansetron  (ZOFRAN ) 4 MG tablet Take 4 mg by mouth 2 (two) times daily as needed.     rosuvastatin  (CRESTOR ) 20 MG tablet Take 20 mg by mouth at bedtime.     TRULICITY  1.5 MG/0.5ML SOAJ Inject 1.5 mg into the skin once a week.     amLODipine  (NORVASC ) 5 MG tablet Take 1 tablet (5 mg total) by mouth daily. (Patient not taking: Reported on 09/21/2023) 90 tablet 3   clopidogrel  (PLAVIX ) 75 MG tablet TAKE 1 TABLET BY MOUTH DAILY (Patient not taking: Reported on 09/21/2023) 90 tablet 1   Dulaglutide  (TRULICITY ) 0.75 MG/0.5ML SOAJ Inject  0.75 mg into the skin once a week. (Patient not taking: Reported on 09/21/2023) 6 mL 3   ezetimibe  (ZETIA ) 10 MG tablet TAKE 1 TABLET BY MOUTH DAILY (Patient not taking: Reported on 09/21/2023) 90 tablet 3   latanoprost  (XALATAN ) 0.005 % ophthalmic solution Place 1 drop into both eyes at bedtime. (Patient not taking: Reported on 09/21/2023)     losartan  (COZAAR ) 50 MG tablet TAKE 1 TABLET BY MOUTH DAILY (Patient not taking: Reported on 09/21/2023) 90 tablet 3   ondansetron  (ZOFRAN ) 4 MG tablet Take 1 tablet (4 mg total) by mouth 2 (two) times daily as needed for nausea or vomiting. (Patient not taking: Reported on 09/21/2023) 30 tablet 1   rosuvastatin  (CRESTOR ) 20 MG tablet TAKE 1 TABLET BY MOUTH DAILY (Patient not taking: Reported on 09/21/2023)  90 tablet 3   sertraline  (ZOLOFT ) 50 MG tablet TAKE 1 TABLET BY MOUTH DAILY (Patient not taking: Reported on 09/21/2023) 90 tablet 3   topiramate  (TOPAMAX ) 50 MG tablet Take 1 tablet (50 mg total) by mouth at bedtime. (Patient not taking: Reported on 09/21/2023) 30 tablet 5   No current facility-administered medications on file prior to visit.    Past Medical History:  Diagnosis Date   Bilateral carpal tunnel syndrome 03/27/2019   Boils    Glaucoma    Heart murmur    HTN (hypertension)    Hx of adenomatous colonic polyps    Hyperlipidemia    Osteoporosis    Pneumonia    Stroke (HCC)    Type II or unspecified type diabetes mellitus without mention of complication, uncontrolled    Allergies  Allergen Reactions   Penicillins Other (See Comments)    Does not know reaction    Social History   Socioeconomic History   Marital status: Single    Spouse name: Not on file   Number of children: Not on file   Years of education: Not on file   Highest education level: Associate degree: occupational, Scientist, product/process development, or vocational program  Occupational History   Not on file  Tobacco Use   Smoking status: Former    Current packs/day: 0.00    Types: Cigarettes     Quit date: 10/15/2016    Years since quitting: 6.9   Smokeless tobacco: Never   Tobacco comments:    smokes occ.   Vaping Use   Vaping status: Never Used  Substance and Sexual Activity   Alcohol  use: Not Currently    Comment: occ   Drug use: No   Sexual activity: Not on file  Other Topics Concern   Not on file  Social History Narrative   ** Merged History Encounter **       Caffiene coffee 1 cup in am. Retired from [employer].      Social Drivers of Corporate investment banker Strain: Low Risk  (05/16/2023)   Overall Financial Resource Strain (CARDIA)    Difficulty of Paying Living Expenses: Not hard at all  Food Insecurity: No Food Insecurity (09/14/2023)   Hunger Vital Sign    Worried About Running Out of Food in the Last Year: Never true    Ran Out of Food in the Last Year: Never true  Transportation Needs: No Transportation Needs (09/14/2023)   PRAPARE - Administrator, Civil Service (Medical): No    Lack of Transportation (Non-Medical): No  Physical Activity: Insufficiently Active (05/16/2023)   Exercise Vital Sign    Days of Exercise per Week: 2 days    Minutes of Exercise per Session: 60 min  Stress: No Stress Concern Present (05/16/2023)   [employer] of Occupational Health - Occupational Stress Questionnaire    Feeling of Stress : Not at all  Recent Concern: Stress - Stress Concern Present (05/09/2023)   [employer] of Occupational Health - Occupational Stress Questionnaire    Feeling of Stress : To some extent  Social Connections: Unknown (09/14/2023)   Social Connection and Isolation Panel    Frequency of Communication with Friends and Family: Three times a week    Frequency of Social Gatherings with Friends and Family: More than three times a week    Attends Religious Services: Not on file    Active Member of Clubs or Organizations: Not on file    Attends Banker Meetings:  Not on file    Marital Status: Not on file     Vitals:   09/21/23 1121 09/21/23 1131  BP: (!) 150/78 (!) 150/80  Pulse: 78   Resp: 16   Temp: 98.2 F (36.8 C)   SpO2: 97%    Body mass index is 20.47 kg/m.  Physical Exam Vitals and nursing note reviewed.  Constitutional:      General: She is not in acute distress.    Appearance: She is well-developed.  HENT:     Head: Normocephalic and atraumatic.     Mouth/Throat:     Mouth: Mucous membranes are moist.     Pharynx: Oropharynx is clear. Uvula midline.  Eyes:     Conjunctiva/sclera: Conjunctivae normal.  Cardiovascular:     Rate and Rhythm: Normal rate and regular rhythm.     Heart sounds: Murmur (soft SEM RUSB) heard.     Comments: DP pulses palpable. Pulmonary:     Effort: Pulmonary effort is normal. No respiratory distress.     Breath sounds: Normal breath sounds.  Abdominal:     General: There is no abdominal bruit.     Palpations: Abdomen is soft. There is no mass.     Tenderness: There is no abdominal tenderness.  Musculoskeletal:     Right lower leg: No edema.     Left lower leg: No edema.  Skin:    General: Skin is warm.     Findings: Ecchymosis (Left periocular) present. No erythema or rash.  Neurological:     General: No focal deficit present.     Mental Status: She is alert and oriented to person, place, and time.     Comments: Unstable gait assisted with a walker.  Psychiatric:        Mood and Affect: Mood and affect normal.   ASSESSMENT AND PLAN:  Sarah [name] was seen today for hospitalization follow-up.  Diagnoses and all orders for this visit:  Pain of left lower extremity We have discussed possible etiologies, no claudication like symptoms. Hx of back pain, ? Radiculopathy. Planning on re-scheduling appt with ortho.  Fall, subsequent encounter Fall precautions discussed. Several risk factors. She has a walker for assistance and doing PT.  PAD (peripheral artery disease) (HCC) Hx of CVA,carotid artery stenosis,CAD. Recent ABI  with some abnormalities. Currently on Plavix ,aspirin ,Rosuvastatin ,and Zetia . LDL 44 in 10/2022. She follows with cardiologist regularly. She refers to hold on vascular evaluation.  Primary hypertension Assessment & Plan: BP today mildly elevated, she is not monitoring BP's at home, recommend doing so. Continue Losartan  50 mg daily and Amlodipine  5 mg daily as well as low salt diet.  I personally spent a total of 33 minutes in the care of the patient today including preparing to see the patient, getting/reviewing separately obtained history, performing a medically appropriate exam/evaluation, counseling and educating, documenting clinical information in the EHR, and reviewing records/imaging.  Return if symptoms worsen or fail to improve, for keep next appointment.  Jahziel Sinn G. [place], MD  [facility]. [place] office.

## 2023-09-22 NOTE — Progress Notes (Signed)
 Remote Loop Recorder Transmission

## 2023-09-27 ENCOUNTER — Ambulatory Visit (INDEPENDENT_AMBULATORY_CARE_PROVIDER_SITE_OTHER): Admitting: Physician Assistant

## 2023-09-27 ENCOUNTER — Encounter: Payer: Self-pay | Admitting: Physician Assistant

## 2023-09-27 ENCOUNTER — Other Ambulatory Visit: Payer: Self-pay

## 2023-09-27 DIAGNOSIS — M25551 Pain in right hip: Secondary | ICD-10-CM

## 2023-09-27 DIAGNOSIS — M545 Low back pain, unspecified: Secondary | ICD-10-CM | POA: Diagnosis not present

## 2023-09-27 DIAGNOSIS — M51369 Other intervertebral disc degeneration, lumbar region without mention of lumbar back pain or lower extremity pain: Secondary | ICD-10-CM

## 2023-09-27 DIAGNOSIS — G8929 Other chronic pain: Secondary | ICD-10-CM

## 2023-09-27 DIAGNOSIS — M1612 Unilateral primary osteoarthritis, left hip: Secondary | ICD-10-CM

## 2023-09-27 MED ORDER — PREDNISONE 10 MG PO TABS: 10.0000 mg | ORAL_TABLET | Freq: Every day | ORAL | 0 refills | Status: DC

## 2023-09-27 NOTE — Progress Notes (Signed)
 Office Visit Note   Patient: Sarah [name]           Date of Birth: 1945-04-29           MRN: [id number] Visit Date: 09/27/2023              Requested by: [place], [name], MD [address] [place],  [place] [address] PCP: [place], [name], MD  Chief Complaint  Patient presents with   Left Leg - Pain      HPI: 78 y/o female with a 2-3 month history of left leg pain.  She states I wakes her from sleep and once she moves round the pain seems to improve.  She states the pain is anterior groin, lateral left hip and post thigh.  She can no lay on her left side to sleep.    She ambulates with a rolling walker and lives with her mother who is 90+.  She did have a recent fall down some steps, but states the pain was there prior to her fall.    Assessment & Plan: Visit Diagnoses:  1. Chronic bilateral low back pain, unspecified whether sciatica present   2. Pain in right hip   3. Arthritis of left hip   4. Degeneration of intervertebral disc of lumbar region, unspecified whether pain present     Plan: We will start with prednisone  10 mg with breakfast.  Once the pain has subsided she can stop the steroids.  I advised her to adjust her DM medication as needed and to keep a close eye on her glucose checks daily.    Follow-Up Instructions: Return in about 3 weeks (around 10/18/2023).   Ortho Exam  Patient is alert, oriented, no adenopathy, well-dressed, normal affect, normal respiratory effort. Left hip pain anterior and posterior on the left.  Limited external hip rotation compared to the right.  Decrease internal rotation that produces pain.  Pain with resistance left hip forward flexion.  No direct pain reproduction with palpation along the lumbar spine.     Imaging: DDD, anterior vertebrale spurring at multiple levels.  B hip joint narrowing  changes.  Acetabular spurring B hip joints.  Labs: Lab Results  Component Value Date   HGBA1C 6.8 (A) 06/29/2023   HGBA1C 8.5  (A) 03/01/2023   HGBA1C 12.2 (H) 10/12/2022   CRP <1.0 06/10/2023   REPTSTATUS 10/19/2022 FINAL 10/14/2022   REPTSTATUS 10/19/2022 FINAL 10/14/2022   GRAMSTAIN  09/26/2015    ABUNDANT WBC PRESENT, PREDOMINANTLY PMN ABUNDANT GRAM POSITIVE COCCI IN PAIRS MODERATE GRAM VARIABLE ROD    CULT  10/14/2022    NO GROWTH 5 DAYS Performed at [facility], [address], [place], [place] [address]    CULT  10/14/2022    NO GROWTH 5 DAYS Performed at [facility], [address] [place], [place] [address]      Lab Results  Component Value Date   ALBUMIN 2.7 (L) 09/14/2023   ALBUMIN 3.3 (L) 03/17/2023   ALBUMIN 1.8 (L) 10/14/2022    Lab Results  Component Value Date   MG 2.2 09/15/2023   MG 2.1 08/15/2023   MG 2.1 10/14/2022   Lab Results  Component Value Date   VD25OH 39.17 06/26/2018   VD25OH 20.16 (L) 12/19/2017   VD25OH 11.82 (L) 07/29/2017    No results found for: PREALBUMIN    Latest Ref Rng & Units 09/16/2023    3:22 AM 09/15/2023    3:04 AM 09/14/2023  3:38 AM  CBC EXTENDED  WBC 4.0 - 10.5 K/uL 6.8  6.5  9.4   RBC 3.87 - 5.11 MIL/uL 3.14  2.99  3.12   Hemoglobin 12.0 - 15.0 g/dL 9.7  9.3  9.9   HCT 63.9 - 46.0 % 30.1  29.4  30.8   Platelets 150 - 400 K/uL 264  265  246      There is no height or weight on file to calculate BMI.  Orders:  Orders Placed This Encounter  Procedures   XR Lumbar Spine 2-3 Views   Meds ordered this encounter  Medications   predniSONE  (DELTASONE ) 10 MG tablet    Sig: Take 1 tablet (10 mg total) by mouth daily with breakfast.    Dispense:  30 tablet    Refill:  0    Supervising Provider:   DUDA, [name] V [[id number]]     Procedures: No procedures performed  Clinical Data: No additional findings.  ROS:  All other systems negative, except as noted in the HPI. Review of Systems  Objective: Vital Signs: There were no vitals taken for this visit.  Specialty Comments:  No specialty comments available.  PMFS  History: Patient Active Problem List   Diagnosis Date Noted   Fall down stairs 09/13/2023   HTN (hypertension) 09/13/2023   HLD (hyperlipidemia) 09/13/2023   DM (diabetes mellitus) (HCC) 09/13/2023   CKD (chronic kidney disease) stage 2, GFR 60-89 ml/min 09/13/2023   History of CVA (cerebrovascular accident) 09/13/2023   Carotid arterial disease (HCC) 09/13/2023   CAD (coronary artery disease) 09/13/2023   Anemia 09/13/2023   B12 deficiency 04/12/2023   Acute ischemic stroke (HCC) 10/13/2022   Generalized weakness 10/12/2022   Stroke (cerebrum) (HCC) 10/12/2022   Stroke-like symptoms 10/11/2022   Urge urinary incontinence 07/02/2022   Seborrheic keratosis 07/02/2022   Pain of right lower extremity 12/11/2021   Nausea and vomiting in adult 12/11/2021   Elevated troponin    Change in mental status 10/24/2021   Long term current use of antithrombotics/antiplatelets 10/02/2021   Heart murmur 04/08/2021   Dizziness 04/08/2021   Long term (current) use of antithrombotics/antiplatelets 12/16/2020   Chronic constipation 12/16/2020   Colon cancer screening 12/16/2020   History of adenomatous polyp of colon 12/16/2020   Coronary artery disease of native artery of native heart with stable angina pectoris (HCC) 03/04/2020   Diabetes mellitus (HCC) 03/03/2020   Aortic atherosclerosis (HCC) 02/12/2020   Bilateral leg cramps 01/21/2020   Bilateral lower extremity edema 01/21/2020   Anxiety disorder 01/21/2020   Type 2 diabetes mellitus with hyperglycemia, with long-term current use of insulin  (HCC) 08/15/2019   Type 2 diabetes mellitus with diabetic polyneuropathy, with long-term current use of insulin  (HCC) 08/15/2019   Uncontrolled type 2 diabetes mellitus with hypoglycemia without coma (HCC) 08/15/2019   Hyperlipidemia LDL goal <70 08/15/2019   Bilateral carpal tunnel syndrome 03/27/2019   Osteoarthritis of both knees 11/27/2018   CKD (chronic kidney disease), stage II 11/27/2018    GERD (gastroesophageal reflux disease) 06/26/2018   Homonymous hemianopia, left 02/08/2018   Cerebrovascular accident (CVA) due to embolism of cerebral artery (HCC)    Diabetes mellitus with coincident hypertension (HCC)    Trigger finger, right index finger 10/17/2017   Low back pain 07/29/2017   Carotid artery stenosis 11/02/2016   Left arm weakness    Diastolic dysfunction    Hypertension with heart disease    Acute blood loss anemia    Cerebral infarction due to embolism of  right middle cerebral artery (HCC)    Perirectal abscess 09/26/2015   Vitamin D  deficiency 07/23/2015   Osteopenia 02/16/2013   Ankle fracture 01/19/2013   Anemia 09/27/2011   Type II diabetes mellitus with nephropathy (HCC) 09/24/2011   Mixed hyperlipidemia 09/24/2011   Primary hypertension 09/24/2011   Past Medical History:  Diagnosis Date   Bilateral carpal tunnel syndrome 03/27/2019   Boils    Glaucoma    Heart murmur    HTN (hypertension)    Hx of adenomatous colonic polyps    Hyperlipidemia    Osteoporosis    Pneumonia    Stroke (HCC)    Type II or unspecified type diabetes mellitus without mention of complication, uncontrolled     Family History  Problem Relation Age of Onset   Diabetes Mother    Hypertension Mother    Stroke Father    Stroke Maternal Uncle    Stroke Paternal Uncle    Colon cancer Neg Hx    Esophageal cancer Neg Hx    Rectal cancer Neg Hx    Stomach cancer Neg Hx    Inflammatory bowel disease Neg Hx    Liver disease Neg Hx    Pancreatic cancer Neg Hx     Past Surgical History:  Procedure Laterality Date   ABDOMINAL HYSTERECTOMY     CARPAL TUNNEL RELEASE     right   COLONOSCOPY  12-10-10   per Dr. Debrah, clear, repeat in 7 yrs    CORONARY STENT INTERVENTION N/A 03/11/2020   Procedure: CORONARY STENT INTERVENTION;  Surgeon: Dann Candyce RAMAN, MD;  [facility]: [facility] INVASIVE CV LAB;  Service: Cardiovascular;  Laterality: N/A;   CORONARY ULTRASOUND/IVUS N/A 03/11/2020    Procedure: Intravascular Ultrasound/IVUS;  Surgeon: Dann Candyce RAMAN, MD;  Location: [facility] INVASIVE CV LAB;  Service: Cardiovascular;  Laterality: N/A;   INCISION AND DRAINAGE PERIRECTAL ABSCESS N/A 09/26/2015   Procedure: IRRIGATION AND DEBRIDEMENT PERIRECTAL ABSCESS;  Surgeon: Herlene Beverley Bureau, MD;  Location: [facility] OR;  Service: General;  Laterality: N/A;   KNEE ARTHROSCOPY     right   LEFT HEART CATH AND CORONARY ANGIOGRAPHY N/A 03/11/2020   Procedure: LEFT HEART CATH AND CORONARY ANGIOGRAPHY;  Surgeon: Dann Candyce RAMAN, MD;  Location: [facility] INVASIVE CV LAB;  Service: Cardiovascular;  Laterality: N/A;   LOOP RECORDER INSERTION N/A 10/19/2016   Procedure: LOOP RECORDER INSERTION;  Surgeon: Kelsie Agent, MD;  Location: [facility] INVASIVE CV LAB;  Service: Cardiovascular;  Laterality: N/A;   ORIF ANKLE FRACTURE Right 03/24/2012   Procedure: OPEN REDUCTION INTERNAL FIXATION (ORIF) ANKLE FRACTURE;  Surgeon: Jerona LULLA Sage, MD;  Location: [facility] OR;  Service: Orthopedics;  Laterality: Right;  Open Reduction Internal Fixation Right Bimalleolar ankle fracture   POLYPECTOMY     TEE WITHOUT CARDIOVERSION N/A 10/18/2016   Procedure: TRANSESOPHAGEAL ECHOCARDIOGRAM (TEE);  Surgeon: Okey Vina LULLA, MD;  Location: [facility] ENDOSCOPY;  Service: Cardiovascular;  Laterality: N/A;   TEE WITHOUT CARDIOVERSION N/A 10/14/2022   Procedure: TRANSESOPHAGEAL ECHOCARDIOGRAM;  Surgeon: Pietro Redell RAMAN, MD;  Location: [facility] INVASIVE CV LAB;  Service: Cardiovascular;  Laterality: N/A;   TONSILLECTOMY     Social History   Occupational History   Not on file  Tobacco Use   Smoking status: Former    Current packs/day: 0.00    Types: Cigarettes    Quit date: 10/15/2016    Years since quitting: 6.9   Smokeless tobacco: Never   Tobacco comments:    smokes occ.   Vaping Use   Vaping  status: Never Used  Substance and Sexual Activity   Alcohol  use: Not Currently    Comment: occ   Drug use: No   Sexual activity: Not on file

## 2023-09-29 NOTE — Progress Notes (Signed)
 Carelink Summary Report / Loop Recorder

## 2023-09-30 ENCOUNTER — Ambulatory Visit (HOSPITAL_COMMUNITY)
Admission: RE | Admit: 2023-09-30 | Discharge: 2023-09-30 | Disposition: A | Discharge status: Home / Self Care | Source: Ambulatory Visit | Attending: Internal Medicine | Admitting: Internal Medicine

## 2023-09-30 ENCOUNTER — Telehealth: Payer: Self-pay | Admitting: *Deleted

## 2023-09-30 DIAGNOSIS — R011 Cardiac murmur, unspecified: Secondary | ICD-10-CM | POA: Insufficient documentation

## 2023-09-30 DIAGNOSIS — I6340 Cerebral infarction due to embolism of unspecified cerebral artery: Secondary | ICD-10-CM | POA: Diagnosis present

## 2023-09-30 LAB — ECHOCARDIOGRAM COMPLETE
Area-P 1/2: 3.34 cm2
S' Lateral: 1.7 cm

## 2023-09-30 NOTE — Telephone Encounter (Signed)
 Copied from CRM [id number]. Topic: Clinical - Home Health Verbal Orders >> Sep 30, 2023  1:14 PM Franky GRADE wrote: Caller/Agency: [place] / [facility]  Callback Number: [phone] Service Requested: Occupational Therapy Frequency: Move evaluation to next week due to not being able to get a hold of patient.  Any new concerns about the patient? No

## 2023-10-01 ENCOUNTER — Ambulatory Visit: Payer: Self-pay | Admitting: Internal Medicine

## 2023-10-03 NOTE — Telephone Encounter (Signed)
It is okay to give verbal authorization to requested services. Thanks, BJ 

## 2023-10-03 NOTE — Telephone Encounter (Signed)
 Will informed of Verbal ok

## 2023-10-05 ENCOUNTER — Ambulatory Visit: Payer: Self-pay

## 2023-10-05 NOTE — Telephone Encounter (Signed)
 FYI Only or Action Required?: Action required by provider: update on patient condition.  Patient was last seen in primary care on 09/21/2023 by [place], [name], MD.  Called Nurse Triage reporting Headache.  Symptoms began several days ago.  Interventions attempted: OTC medications: tylenol .  Symptoms are: unchanged.  Triage Disposition: See PCP Within 2 Weeks  Patient/caregiver understands and will follow disposition?: Yes   Copied from CRM [id number]. Topic: Clinical - Red Word Triage >> Oct 05, 2023  2:29 PM Franky GRADE wrote: Red Word that prompted transfer to Nurse Triage: [place] [facility]. Patient experiencing complaints of headache, left fore head up eye area.  Patient had suffered a fall in early september. She is also experiencing blurred vision and dizziness. Reason for Disposition  Headache is a chronic symptom (recurrent or ongoing AND present > 4 weeks)  Answer Assessment - Initial Assessment Questions Advised ED if symptoms worsen.  Received call from [place], [facility], reports pt c/o still having HA, blurred vision, and dizziness since fall 10/03/23. Reports blood glucose 284.  Patient reports I want my doctor to know still having HA, blurred vision and dizzy since fall. Pt reports symptoms are not worse, the same. Patient reports has not taken Tylenol  this morning, makes sleepy, but Tylenol  does help. Pt reports have neurology appt tomorrow and eye doctor office will call back today to schedule an appt.  Bp 130/60, 73, 98%RA,Blood sugar-284; just checked, ate at 11:30 AM  1. LOCATION: Where does it hurt?      Headache, 6/10, left side of forehead and eye pain, still having HA from fall, feels dizzy when laying down, not getting any better.  Reports blurred vision(due to stroke, for 1 year),  HA Denies n/v, numbness, weakness, diff talking/ walking  2. ONSET: When did the headache start? (e.g., minutes, hours, days)      Couple days ago 3.  PATTERN: Does the pain come and go, or has it been constant since it started?     In the morning it's constant, but on and off during the evening 4. SEVERITY: How bad is the pain? and What does it keep you from doing?  (e.g., Scale 1-10; mild, moderate, or severe)     6/10 5. RECURRENT SYMPTOM: Have you ever had headaches before? If Yes, ask: When was the last time? and What happened that time?      Still having HA 6. CAUSE: What do you think is causing the headache?     Fall; HA is the same since hospitalization, worse in the mornings 7. MIGRAINE: Have you been diagnosed with migraine headaches? If Yes, ask: Is this headache similar?      no 8. HEAD INJURY: Has there been any recent injury to your head?      [place]; hospitalized 09/13/23 9. OTHER SYMPTOMS: Do you have any other symptoms? (e.g., fever, stiff neck, eye pain, sore throat, cold symptoms)     Denies fever, n/v, stiff neck, weakness, numbness,  Tylenol  for HA; yes Call eye doctor and was told they will call back with an appointment, today. Neurology appt tomorrow  Answer Assessment - Initial Assessment Questions Advised ED if symptoms worsen.  Received call from [place], [facility], reports pt c/o still having HA, blurred vision, and dizziness since fall 10/03/23. Reports blood glucose 284.  Patient reports I want my doctor to know still having HA, blurred vision and dizzy since fall. Pt reports symptoms are not worse, the same. Patient  reports has not taken Tylenol  this morning, makes sleepy, but Tylenol  does help. Patient reports MD said the vision was effected from stroke and already seen Dr. [place] for hospital f/u.  Pt reports have neurology appt tomorrow and eye doctor office will call back today to schedule an appt.  Bp 130/60, 73, 98%RA,Blood sugar-284; just checked, ate at 11:30 AM  1. LOCATION: Where does it hurt?      Headache, 6/10, left side of forehead and eye pain, still having HA  from fall, feels dizzy when laying down, not getting any better.  Reports blurred vision(due to stroke, for 1 year),  HA since fall.  Denies n/v, numbness, weakness, diff talking/ walking  2. ONSET: When did the headache start? (e.g., minutes, hours, days)      Couple days ago 3. PATTERN: Does the pain come and go, or has it been constant since it started?     In the morning it's constant, but on and off during the evening 4. SEVERITY: How bad is the pain? and What does it keep you from doing?  (e.g., Scale 1-10; mild, moderate, or severe)     6/10 5. RECURRENT SYMPTOM: Have you ever had headaches before? If Yes, ask: When was the last time? and What happened that time?      Still having HA 6. CAUSE: What do you think is causing the headache?     Fall; HA is the same since hospitalization, worse in the mornings 7. MIGRAINE: Have you been diagnosed with migraine headaches? If Yes, ask: Is this headache similar?      no 8. HEAD INJURY: Has there been any recent injury to your head?      [place]; hospitalized 09/13/23 9. OTHER SYMPTOMS: Do you have any other symptoms? (e.g., fever, stiff neck, eye pain, sore throat, cold symptoms)     Denies fever, n/v, stiff neck, weakness, numbness,  Tylenol  for HA; yes  Protocols used: Headache-A-AH, Information Only Call - No Triage-A-AH

## 2023-10-05 NOTE — Telephone Encounter (Signed)
Noted. BJ 

## 2023-10-05 NOTE — Progress Notes (Unsigned)
 [facility]  PATIENT: Sarah [name] DOB: [birth year]    REASON FOR VISIT: Follow-up for stroke right MCA 10/2016 readmitted 02/08/2018 for right parieto-occipital stroke  HISTORY FROM: Patient, sister and chart  Chief complaint: Chief Complaint  Patient presents with   Follow-up    Pt in 8 with sister in law  Pt here for stroke f/u Pt had fall last week Pt states admitted for a week. Pt states increased headaches since fall Pt states blurriness in  both eyes .       HPI:  Update 10/06/2023 JM: Patient returns for follow-up visit accompanied by her sister-in-law.  Her main concern today is a fall that occurred in the beginning of the month and hospitalized for 3 days.  Notes reviewed.  Concern of possible vasovagal syncopal episode leading to a fall down a flight of stairs and noted concussion injury.  CT head negative for acute abnormalities.  She was evaluated by EP cardiology, loop recorder interrogated without clinically significant episodes.    Currently, she complains of persistent left-sided headaches, dizziness with position changes and bilateral blurred vision since her fall.  She is currently waiting for [facility] to call her back to make an appointment. Dizziness worse in the morning, especially when she first wakes up and tries to sit up.  She does remain on topiramate  50 mg nightly for headache prevention, headaches very well-controlled prior to her fall.  Thankfully, she has not had any additional falls since that time.  She is currently working with home health PT and continues to use a rollator walker at all times.  She was advised to monitor blood pressure at home and just obtained a blood pressure cuff from [employer] today.  She does admit to poor water intake, primarily drinks coffee and soda.  No new stroke/TIA symptoms. Remains on DAPT, Crestor  and Zetia  without side effects.  Routinely follows with PCP Dr. [place]il[place] for stroke risk factor  management.      History provided for reference purposes only Update 03/17/2023 JM: Patient returns for follow-up visit unaccompanied, care aide waiting in lobby. Main concern today is memory difficulties over the past month, primarily short-term.  Has difficulty remembering what was recently said or recent conversations, having difficulty remembering days of the week.  Denies any recent changes to medications (endo did recently start Trulicity  but she just received rx and has not yet started), denies any changes in sleep or stress levels, denies depression/anxiety, no recent illness.  Continues to live with her mother who has been having her own difficulties with memory over the past couple of months (mother is 33 yo).  Able to maintain ADLs independently, sister assists with putting pills in pill boxes, will sometimes miss dosages as she doesn't feel [name] taking as she is taking so many medications. She does not drive.  Continues to have home health aide.  Continues to have gait difficulty with RLE weakness and use of cane, questions restart in [facility] therapy as she feels she is moving slower, no recent falls.  Does not do routine memory or physical exercises.  Denies any new stroke/TIA symptoms such as weakness, speech/language difficulties, numbness/tingling or vision changes.  Denies any recent headaches, remains on topiramate  50 mg nightly. Remains on DAPT, Crestor  and Zetia . Had follow-up visit with cardiology last month, note reviewed, advised indefinite DAPT. Routinely follows with PCP and endocrinology.  Update 11/17/2022 JM: Patient is being seen for sooner scheduled visit due to recent stroke.  She presented to ED on 10/11/2022 due to new onset right sided leg weakness with fall.  Stroke workup revealed several small infarcts at left brain watershed areas and left cerebellum, etiology concerning for cardioembolic source.  CTA showed moderate to advanced bilateral MCA stenosis.  2D echo showed  hyperechoic mass on left coronary cusp, TEE showed nodular density left coronary cusp (possibly nodular calcification) and mobile density right coronary cusp (possible vegetation vs lambl's excrescence).  On Plavix  PTA with noted noncompliance, recommended aspirin  and Brilinta  for 1 month then switch to aspirin  and Plavix , if recurrent stroke while on DAPT, recommended consideration of anticoagulation, recommended outpatient cardiology follow-up.  New ILR placed 06/2022, interrogated without evidence of A-fib.  LDL 44, A1c 12.2. Continued home dose Crestor  and Zetia .  Recommended close PCP follow-up for better DM control.  Therapies recommended home health therapies.  Reports gradually recovering since discharge.  Still has some right-sided weakness but gradually improving.  Continues working with home health PT.  Using rollator walker at all times, no recent falls.  Living with 59 year old mother, maintains ADLs independently, does have aide assistance and sister in law who lives close by to assist with IADLs and transportation as patient does not drive (sister in law waiting in lobby today). Hx of chronic frontal headaches previously well managed on topiramate  25 mg nightly but worsened since stroke, now occurring about every other day, still frontal and behind eyes, associated with photophobia, previously with nausea but not recently. Questions if topiramate  dosage can be increased. Denies OTC pain reliever use. Headaches typically last about 20 minutes and then gradually improve.   Completed 1 month of Brilinta  and aspirin , now on aspirin  and Plavix  without side effects as well as Crestor  and Zetia  without side effects Has since had follow-up with endocrinology who noted higher A1c due to medication nonadherence, adjustments to antidiabetic regimen made, Follow-up in February. Reports glucose levels have been good, reports using Dexcom at home but does not have monitor with her today and she is waiting on  new sensors. Sister and aide assist with medication management.  Has not yet had follow-up with cardiology, loop recorder monitored which has not shown atrial fibrillation thus far Sleep study completed 09/2022 without any significant sleep apnea  Update 09/07/2022 JM: Returns for follow-up visit unaccompanied.  Residual gait and balance impairment and at times unsteadiness, some improvement since prior visit. Completed PT last week, noted some benefit. Continues to do HEP.  Ambulates with cane, denies any recent falls.  Denies new stroke/TIA symptoms.  At prior visit, complained of daily headaches and initiated topiramate  25mg  nightly, has not had any recent headaches, tolerating topamax  well.  Also evaluated by Dr. Buck for possible underlying sleep apnea, sleep study scheduled 9/22.   Reports compliance on Plavix , atorvastatin  and Zetia .  Routinely follows with PCP and endocrinology for stroke risk factor management.  Continues to have decreased vision in left eye, routinely followed by ophthalmology, was told she had blood behind her eye, plans to f/u towards the end of this year.   Update 03/09/2022 JM: Overall stable from stroke standpoint without any new or worsening stroke/TIA symptoms.  Reports residual gait difficulty and imbalance, has improved some since prior visit, has graduated from walker to cane. Still feels unsteady at times when ambulating. Working with therapy at home. Denies any recent falls.   Continues to experience headaches, have been every other day if not daily. Can occur mid afternoon typically, located frontal and behind eyes. Questions if  due to her vision, can feel eye fatigue/strain after reading for prolonged period of time or watching television, has not noticed worsening of headaches around this time. Has f/u with eye doctor in April. Denies taking OTC medications to help with headaches.  She also continues to complain of daytime fatigue.  Compliant on Plavix ,  atorvastatin  and Zetia  - currently prescribed 2x 40mg  atorvastatin  tablets (due to difficulty swallowing 80mg  tablet) but believes only take 40mg  per day Blood pressure typically well controlled, slightly on higher side today, monitored by [facility] therapy and has been stable Routinely follows with PCP and endocrinology Cardiac monitor negative for atrial fibrillation  Update 11/04/2021 JM: Patient returns for recent hospitalization.  She is accompanied by her sister. She presented to ED on 10/13 with acute encephalopathy with memory lapses and feeling unsteady/wobbly with walking and found to have punctate left cerebellar and left parietal lobe infarctions likely incidental finding on MRI during encephalopathy work-up, etiology unclear possibly cardioembolic pattern.  MRA negative LVO, bilateral M1 segment stenosis.  Carotid Doppler unremarkable.  EEG normal.  EF 60 to 65%.  Recommend 30-day event monitor to evaluate for A-fib, previously had loop recorder without evidence of A-fib.  LDL 227.  A1c 12.6.  Recommended DAPT for 3 weeks then Plavix  alone and as well as continuation of home dose Zetia  and atorvastatin  and recommended follow-up outpatient with lipid clinic for further lipid management.  Also advised outpatient PCP follow-up for better DM control.  She was evaluated by therapies, initially recommended CIR but due to improvement during hospitalization, recommendations changed to outpatient therapy and 24/7 care/supervision.   Reports since discharge, she has been experiencing generalized weakness sensation with increased fatigue and awakening about 3 times per week with left-sided headaches, these usually get better throughout the day, will use Tylenol  if needed with benefit (denies overuse).  She also reports some visual changes in her left eye, has appt with ophthalmologist Dr. Loreli on Monday.  She denies any specific weakness or speech difficulty.  Does have some gait impairment and currently  ambulating with RW, denies any recent falls.  She is living at home but family has been providing 24/7 care/assistance.  She is scheduled to start outpatient therapies tomorrow.  Denies any new stroke/TIA symptoms.  She remains on aspirin  and Plavix  as well as atorvastatin  and Zetia , denies side effects -family has been assisting with ensuring medication compliance Blood pressure today 141/72 She has since been seen by PCP and endocrinology Has follow-up visit with cardiology 11/28 - reports she has not heard anything in regards to the cardiac monitor   Update 09/11/2020 JM:, returns for requested visit due to c/o worsening balance over the past 2 months and gradual memory decline. Worsening knee pain after a near fall in June - seen by ortho for left knee arthritis and bursitis. Limiting ambulation due to pain.  She is able to ambulate without assistive device.  Mild decline of memory reported but able to maintain ADLs and IADLs independently.  Sedentary lifestyle without any type of memory or cognitive exercises.  Denies any specific weakness, numbness/tingling, speech difficulty or confusion.  Currently on aspirin , plavix , zetia  and atorvastatin  for secondary stroke prevention and hx of CAD. Blood pressure today 143/72.  Glucose levels routinely monitored which has been stable.  No further concerns at this time.  Update 06/21/2019:, Sarah [name] is being seen for stroke follow-up unaccompanied.  She has been stable from a stroke standpoint without new or worsening stroke/TIA symptoms.  Prior complaints  of worsening imbalance, left arm numbness/tingling and speech difficulty with MRI and MRA stable without acute abnormalities.  She reports ongoing improvement and continues to do HEP as recommended by therapy at home.  EMG/NCV completed for left arm numbness/tingling with mild bilateral carpal tunnel syndrome but otherwise unremarkable.  Reports left arm symptoms resolved as well as left-sided neck  symptoms.  Continues on clopidogrel  and atorvastatin  for secondary stroke prevention.  Recently added Zetia  by PCP due to recent lipid panel showing LDL 115.  Blood pressure today 146/72.  Recent A1c 12.3 with recent adjustments of insulin  regimen and continuation of Metformin  and Victoza  by PCP.  Continues to follow routinely with PCP for HTN, HLD and DM management.  Loop recorder has not shown atrial fibrillation thus far.  No concerns at this time.  Update 02/13/2019: Sarah [name] is a 78 year old female who is being seen today for stroke follow-up.  She has been stable from a stroke standpoint with residual mild stable left homonymous hemianopsia, mild imbalance which she believes has been slightly worsening over the past couple of months along with possible worsening of prior aphasia from stroke in 2018.  She does admit to limited physical activity and does not have an established exercise regimen along with endorsing increased stress currently caring for her mother with increased illness and increased stress due to overall COVID-19 pandemic.  She has recently signed up for [employer] program which will be starting in March.  She also states for the past 2 weeks, she has been experiencing left arm numbness/tingling but will resolve quickly and symptoms intermittent.  She does endorse left-sided neck stiffness that radiates into her upper trapezius and increased pain when looking towards the left.  Denies any increased weakness.  Unable to determine if numbness/tingling symptoms occur in certain positions.  She has not previously experienced this sensation.  Denies any other new or worsening neurological symptoms.  She did have bilateral cataract surgery in 07/2018 without complication. Continues on Plavix  and atorvastatin  for secondary stroke prevention without side effects.  Blood pressure today 130/60.  Continues to follow with PCP for DM management with recent A1c 9.7.  Loop recorder is not shown atrial  fibrillation thus far.  Monitoring/battery life will be completed 10/2019.  She has received her first dose of Covid vaccination and plans on receiving second dose next week.  Denies new or worsening stroke/TIA symptoms.  No further concerns at this time.  06/27/2018 VIRTUAL VISIT: Sarah [name] is being seen today for three-month follow-up visit regarding recent stroke.  Initially scheduled for office visit follow-up but due to COVID-19 safety precautions, visit transition to telemedicine via MyChart with patient's consent.  She has been stable from a stroke standpoint with residual left homonymous hemianopsia and balance difficulties.  She continues to participate in outpatient PT with ongoing improvement.  Continues on Plavix  and atorvastatin  for secondary stroke prevention without reported side effects. Blood pressure monitored at home.  Lab work obtained yesterday with A1c 10.3 (prior 13.5).  She continues to follow with PCP for ongoing management.  Loop recorder has not shown atrial fibrillation thus far.  No further concerns at this time.  Denies new or worsening stroke/TIA symptoms.   Hospital follow-up 03/23/2018 CM: Sarah [name], 78 year old female returns for follow-up with readmission for stroke 1/29/ 2020.  MRI of the brain acute cortical and subcortical RPO infarcts.  She initially presented with left hemianopsia.  She is now on Plavix  and aspirin  for 3 months and then Plavix  alone.  She has not had further stroke or TIA symptoms.  She has minimal bruising and no bleeding.  Hemoglobin A1c 13.5 on hospital admission.  She claims she is trying to get that down to a more reasonable level.  She remains on Lipitor  without myalgias.  Blood pressure in the office today 146/87.  She continues to go to [facility].  She is to start rehab physical therapy and occupational therapy today.  Loop recorder so far no atrial fibrillation.  She is not driving due to her vision.  She returns for  reevaluation  Hospital summary: right parieto-occipital infarcts in setting of progressive R MCA stenosis, infarcts secondary to large vessel disease source Resultant L hemianopia  CT head R parietal watershed infarct. Small vessel disease.  MRI  Acute cortical and subcortical R P-O infarcts. Old R CR and R frontal lobe infarcts. Small vessel disease.  MRA head  No LVO. Focal stenosis R M1, R M2/M3, L M1 MRA neck B VA origin atherosclerosis 30-50% 2D Echo  pending  Loop interrogation - last check 1/3 w/o AF - interrogation today without AF LDL 76 HgbA1c 13.5 Lovenox  40 mg sq daily for VTE prophylaxis clopidogrel  75 mg daily prior to admission, now on aspirin  325 mg daily and clopidogrel  75 mg daily. Continue DAPT x 3 months then plavix  alone Therapy recommendations:  OP OT and PT Disposition:  Home Patient advised not to drive      REVIEW OF SYSTEMS: Full 14 system review of systems performed and notable only for those listed, all others are neg:  See HPI  ALLERGIES: Allergies  Allergen Reactions   Penicillins Other (See Comments)    Does not know reaction    HOME MEDICATIONS:   PAST MEDICAL HISTORY: Past Medical History:  Diagnosis Date   Bilateral carpal tunnel syndrome 03/27/2019   Boils    Glaucoma    Heart murmur    HTN (hypertension)    Hx of adenomatous colonic polyps    Hyperlipidemia    Osteoporosis    Pneumonia    Stroke (HCC)    Type II or unspecified type diabetes mellitus without mention of complication, uncontrolled     PAST SURGICAL HISTORY: Past Surgical History:  Procedure Laterality Date   ABDOMINAL HYSTERECTOMY     CARPAL TUNNEL RELEASE     right   COLONOSCOPY  12-10-10   per Dr. Debrah, clear, repeat in 7 yrs    CORONARY STENT INTERVENTION N/A 03/11/2020   Procedure: CORONARY STENT INTERVENTION;  Surgeon: Dann Candyce RAMAN, MD;  Location: [facility] INVASIVE CV LAB;  Service: Cardiovascular;  Laterality: N/A;   CORONARY ULTRASOUND/IVUS N/A  03/11/2020   Procedure: Intravascular Ultrasound/IVUS;  Surgeon: Dann Candyce RAMAN, MD;  Location: [facility] INVASIVE CV LAB;  Service: Cardiovascular;  Laterality: N/A;   INCISION AND DRAINAGE PERIRECTAL ABSCESS N/A 09/26/2015   Procedure: IRRIGATION AND DEBRIDEMENT PERIRECTAL ABSCESS;  Surgeon: Herlene Beverley Bureau, MD;  Location: [facility] OR;  Service: General;  Laterality: N/A;   KNEE ARTHROSCOPY     right   LEFT HEART CATH AND CORONARY ANGIOGRAPHY N/A 03/11/2020   Procedure: LEFT HEART CATH AND CORONARY ANGIOGRAPHY;  Surgeon: Dann Candyce RAMAN, MD;  Location: [facility] INVASIVE CV LAB;  Service: Cardiovascular;  Laterality: N/A;   LOOP RECORDER INSERTION N/A 10/19/2016   Procedure: LOOP RECORDER INSERTION;  Surgeon: Kelsie Agent, MD;  Location: [facility] INVASIVE CV LAB;  Service: Cardiovascular;  Laterality: N/A;   ORIF ANKLE FRACTURE Right 03/24/2012   Procedure: OPEN REDUCTION INTERNAL  FIXATION (ORIF) ANKLE FRACTURE;  Surgeon: Jerona LULLA Sage, MD;  Location: [facility] OR;  Service: Orthopedics;  Laterality: Right;  Open Reduction Internal Fixation Right Bimalleolar ankle fracture   POLYPECTOMY     TEE WITHOUT CARDIOVERSION N/A 10/18/2016   Procedure: TRANSESOPHAGEAL ECHOCARDIOGRAM (TEE);  Surgeon: Okey Vina LULLA, MD;  Location: [facility] ENDOSCOPY;  Service: Cardiovascular;  Laterality: N/A;   TEE WITHOUT CARDIOVERSION N/A 10/14/2022   Procedure: TRANSESOPHAGEAL ECHOCARDIOGRAM;  Surgeon: Pietro Redell RAMAN, MD;  Location: [facility] INVASIVE CV LAB;  Service: Cardiovascular;  Laterality: N/A;   TONSILLECTOMY      FAMILY HISTORY: Family History  Problem Relation Age of Onset   Diabetes Mother    Hypertension Mother    Stroke Father    Stroke Maternal Uncle    Stroke Paternal Uncle    Colon cancer Neg Hx    Esophageal cancer Neg Hx    Rectal cancer Neg Hx    Stomach cancer Neg Hx    Inflammatory bowel disease Neg Hx    Liver disease Neg Hx    Pancreatic cancer Neg Hx     SOCIAL HISTORY: Social History   Socioeconomic History    Marital status: Single    Spouse name: Not on file   Number of children: Not on file   Years of education: Not on file   Highest education level: Associate degree: occupational, Scientist, product/process development, or vocational program  Occupational History   Not on file  Tobacco Use   Smoking status: Former    Current packs/day: 0.00    Types: Cigarettes    Quit date: 10/15/2016    Years since quitting: 6.9   Smokeless tobacco: Never   Tobacco comments:    smokes occ.   Vaping Use   Vaping status: Never Used  Substance and Sexual Activity   Alcohol  use: Not Currently    Comment: occ   Drug use: No   Sexual activity: Not on file  Other Topics Concern   Not on file  Social History Narrative   ** Merged History Encounter **       Caffiene coffee 1 cup in am. Retired from [employer].      Social Drivers of Corporate investment banker Strain: Low Risk  (05/16/2023)   Overall Financial Resource Strain (CARDIA)    Difficulty of Paying Living Expenses: Not hard at all  Food Insecurity: No Food Insecurity (09/14/2023)   Hunger Vital Sign    Worried About Running Out of Food in the Last Year: Never true    Ran Out of Food in the Last Year: Never true  Transportation Needs: No Transportation Needs (09/14/2023)   PRAPARE - Administrator, Civil Service (Medical): No    Lack of Transportation (Non-Medical): No  Physical Activity: Insufficiently Active (05/16/2023)   Exercise Vital Sign    Days of Exercise per Week: 2 days    Minutes of Exercise per Session: 60 min  Stress: No Stress Concern Present (05/16/2023)   [employer] of Occupational Health - Occupational Stress Questionnaire    Feeling of Stress : Not at all  Recent Concern: Stress - Stress Concern Present (05/09/2023)   [employer] of Occupational Health - Occupational Stress Questionnaire    Feeling of Stress : To some extent  Social Connections: Unknown (09/14/2023)   Social Connection and Isolation Panel     Frequency of Communication with Friends and Family: Three times a week    Frequency of Social Gatherings with Friends and  Family: More than three times a week    Attends Religious Services: Not on file    Active Member of Clubs or Organizations: Not on file    Attends Club or Organization Meetings: Not on file    Marital Status: Not on file  Intimate Partner Violence: Not At Risk (09/14/2023)   Humiliation, Afraid, Rape, and Kick questionnaire    Fear of Current or Ex-Partner: No    Emotionally Abused: No    Physically Abused: No    Sexually Abused: No     PHYSICAL EXAM  Today's Vitals   10/06/23 1352  BP: (!) 146/76  Pulse: 82  Weight: 103 lb 6.4 oz (46.9 kg)  Height: 5' 1 (1.549 m)    Body mass index is 19.54 kg/m.  General: well developed, well nourished, very pleasant elderly African-American female, seated, in no evident distress   Neurologic Exam Mental Status: Awake and fully alert. Occasional stuttering of speech/speech hesitancy (chronic).  Oriented to place and time. Recent memory subjectively impaired and remote memory intact. Attention span, concentration and fund of knowledge appropriate during visit. Mood and affect appropriate.  Cranial Nerves: Pupils equal, briskly reactive to light. Extraocular movements full without nystagmus. Visual fields full to confrontation testing. Hearing intact. Facial sensation intact. Face, tongue, palate moves normally and symmetrically.  Motor: Normal bulk and tone. Normal strength in all tested extremity muscles except 4/5 right hip flexor weakness and mild left grip weakness (chronic) Sensory.: intact to touch , pinprick , position and vibratory sensation.  Coordination: Rapid alternating movements normal in all extremities except slightly decreased left hand dexterity (chronic from prior stroke). Finger-to-nose and heel-to-shin performed accurately bilaterally. Gait and Station: Arises from chair with mild difficulty. Stance is  slightly hunched. Gait demonstrates decreased stride length and step height bilaterally with out turning of RLE with mild imbalance and use of cane.  Tandem walk and heel toe not attempted. Reflexes: 1+ and symmetric. Toes downgoing.       DIAGNOSTIC DATA (LABS, IMAGING, TESTING) - I reviewed patient records, labs, notes, testing and imaging myself where available.  Lab Results  Component Value Date   WBC 6.8 09/16/2023   HGB 9.7 (L) 09/16/2023   HCT 30.1 (L) 09/16/2023   MCV 95.9 09/16/2023   PLT 264 09/16/2023      Component Value Date/Time   NA 142 09/16/2023 0322   NA 141 03/17/2023 1501   K 4.4 09/16/2023 0322   CL 110 09/16/2023 0322   CO2 22 09/16/2023 0322   GLUCOSE 117 (H) 09/16/2023 0322   BUN 22 09/16/2023 0322   BUN 20 03/17/2023 1501   CREATININE 1.19 (H) 09/16/2023 0322   CREATININE 0.55 (L) 10/24/2019 1602   CALCIUM  9.6 09/16/2023 0322   PROT 5.7 (L) 09/14/2023 0338   PROT 6.0 03/17/2023 1501   ALBUMIN 2.7 (L) 09/14/2023 0338   ALBUMIN 3.3 (L) 03/17/2023 1501   AST 24 09/14/2023 0338   ALT 26 09/14/2023 0338   ALKPHOS 58 09/14/2023 0338   BILITOT 0.4 09/14/2023 0338   BILITOT <0.2 03/17/2023 1501   GFRNONAA 47 (L) 09/16/2023 0322   GFRNONAA 94 10/24/2019 1602   GFRAA 103 03/04/2020 1025   GFRAA 109 10/24/2019 1602   Lab Results  Component Value Date   CHOL 139 10/12/2022   HDL 79 10/12/2022   LDLCALC 44 10/12/2022   TRIG 79 10/12/2022   CHOLHDL 1.8 10/12/2022   Lab Results  Component Value Date   HGBA1C 6.8 (A) 06/29/2023  ASSESSMENT AND PLAN    Ms. ANABELLA CAPSHAW is a 78 y.o. female with history of  several small infarcts at left brain watershed areas and left cerebellum in 10/2022 concerning for cardioembolic source, and hx of punctate left cerebellar and left parietal lobe infarcts in 10/2021 likely incidental finding on MRI during encephalopathy work-up of unclear etiology, hx of right parieto-occipital infarcts in setting  of progressive right MCA stenosis in 2020 likely secondary to large vessel disease source and right MCA stroke in 2018 s/p ILR without evidence of A-fib during 4 year monitoring duration. S/p fall 09/2023 with worsening headaches, blurred vision and dizziness likely due to postconcussive syndrome, suspected fall from vasovagal syncopal event    Postconcussive syndrome Traumatic headache - Recommend gradually increasing topiramate  to 50 mg twice daily, instructions provided in AVS, advised to call after several weeks if headaches persist or sooner if difficulty tolerating - Advised to contact ophthalmology again to schedule follow-up visit - Advised to monitor blood pressure closely at home, make position changes slowly and importance of increasing water intake - continue working with PT and use of RW at all times for fall prevention  - Discussed typical recovery time with postconcussive injury - CT head negative, no indication for repeat imaging at this time. Can consider if symptoms should worsen   History of multiple strokes -Continue gait impairment with RLE weakness - stable -s/p new ILR 06/2022, no evidence of A-fib thus far -continue aspirin , plavix , Crestor  20 mg daily and Zetia  for secondary stroke prevention managed/prescribed by PCP/cardiology (ongoing use of DAPT per cardiology recommendations) -Continue close PCP, endocrinology and cardiology follow-up for aggressive stroke risk factor management maintain blood pressure goal <130/80, diabetes with hemoglobin A1c goal below 7.0% and lipids with LDL cholesterol goal below 70 mg/dL.    Mild cognitive impairment -prior MMSE 25/30 indicating mild cognitive impairment, not repeated today due to time constraints -MRI brain without acute abnormality -EEG no evidence of seizure activity -Lab work reversible causes benign -Discussed importance of routine cognitive exercises and routine physical activity, healthy diet, good sleep habits,  medication compliance, and management of stroke risk factors   Frontal headaches (since 10/2021) -continue Topamax  with dosage adjustment noted above -sleep study 09/2022 no significant sleep apnea       Follow up in 3-4 months or call earlier if needed     I personally spent a total of 47 minutes in the care of the patient today including preparing to see the patient, getting/reviewing separately obtained history, performing a medically appropriate exam/evaluation, counseling and educating, placing orders, and documenting clinical information in the EHR.   Harlene Bogaert, AGNP-BC  [facility] [address] [place], [place] [address]  Phone [phone] Fax [phone] Note: This document was prepared with digital dictation and possible smart phrase technology. Any transcriptional errors that result from this process are unintentional.

## 2023-10-06 ENCOUNTER — Ambulatory Visit: Admitting: Adult Health

## 2023-10-06 ENCOUNTER — Encounter: Payer: Self-pay | Admitting: Adult Health

## 2023-10-06 VITALS — BP 146/76 | HR 82 | Ht 61.0 in | Wt 103.4 lb

## 2023-10-06 DIAGNOSIS — G44311 Acute post-traumatic headache, intractable: Secondary | ICD-10-CM

## 2023-10-06 DIAGNOSIS — F0781 Postconcussional syndrome: Secondary | ICD-10-CM

## 2023-10-06 DIAGNOSIS — Z8673 Personal history of transient ischemic attack (TIA), and cerebral infarction without residual deficits: Secondary | ICD-10-CM

## 2023-10-06 DIAGNOSIS — G44221 Chronic tension-type headache, intractable: Secondary | ICD-10-CM

## 2023-10-06 DIAGNOSIS — G3184 Mild cognitive impairment, so stated: Secondary | ICD-10-CM

## 2023-10-06 MED ORDER — TOPIRAMATE 50 MG PO TABS: 50.0000 mg | ORAL_TABLET | Freq: Two times a day (BID) | ORAL | 11 refills | Status: DC

## 2023-10-06 NOTE — Patient Instructions (Addendum)
 Your Plan:  Will add morning dose to topamax  - take 1/2 tab in the morning for 1 week then increase to full tablet. Continue full tablet at night  Please ensure you schedule a follow up visit with your ophthalmologist for vision exam   Continue working with home health physical therapy - continue to use your walker at all times for fall prevention      Follow up in 3-4 months or call earlier if needed      Thank you for coming to see us  at [facility]. I hope we have been able to provide you high quality care today.  You may receive a patient satisfaction survey over the next few weeks. We would appreciate your feedback and comments so that we may continue to improve ourselves and the health of our patients.     Post-Concussion Syndrome  A concussion is a brain injury from a direct hit to the head or body. This hit causes the brain to shake back and forth fast inside the skull. The shaking can damage brain cells and cause chemical changes in the brain. Concussions are normally not life-threatening but can cause serious symptoms. Post-concussion syndrome is when symptoms that happen after a concussion last longer than normal. These symptoms can last from weeks to months. What are the causes? The cause of this condition is not known. It can happen whether your head injury was mild or severe. What increases the risk? You are more likely to get this condition if: You are female. You are a child, teen, or young adult. You have had a head injury before. You have a history of headaches. You have depression or anxiety. You have more than one symptom or severe symptoms when your concussion occurs. You faint or cannot remember the event (have amnesia of the event). What are the signs or symptoms? Symptoms of this condition include: Physical symptoms, such as: Headaches. Tiredness. Dizziness and weakness. Blurred vision and sensitivity to light. Trouble  hearing. Problems with balance. Mental and emotional symptoms, such as: Memory problems and trouble focusing. Trouble falling asleep or staying asleep (insomnia). Feeling irritable. Anxiety or depression. Trouble learning new things. How is this diagnosed? This condition may be diagnosed based on: Your symptoms. A description of your injury. Your medical history. Testing your strength, balance, and nerve function (neurological exam). Your health care provider may order other tests. These may include: Brain imaging, such as a CT scan or an MRI. Memory testing (neuropsychological testing). How is this treated? Treatment for this condition may depend on your symptoms. Symptoms normally go away on their own with time. If you need treatment, it may include: Medicines for headaches, anxiety, depression, and insomnia. Resting your brain and body for a few days after your injury. Rehab therapy, such as: Physical or occupational therapy. This may include exercises to help with balance and dizziness. Mental health counseling. A form of talk therapy called cognitive behavioral therapy (CBT) can be especially helpful. This therapy helps you set goals and follow up on the changes that you make. Speech therapy. Vision therapy. A brain and eye specialist can recommend treatments for vision problems. Follow these instructions at home: Medicines Take over-the-counter and prescription medicines only as told by your health care provider. Avoid opioid prescription pain medicines when getting over a concussion. Activity Limit your mental activities for the first few days after your injury. This may include not doing these things: Homework or work for your job. Complex thinking. Watching TV.  Using a computer or phone. Playing memory games and puzzles. Slowly return to your normal activity level. If a certain activity brings on your symptoms, stop or slow down until you can do the activity without  getting symptoms again. Limit physical activity, such as sports or strenuous activities, for the first few days after a concussion. Slowly return to normal activity as told by your health care provider. Light exercise may be helpful. Rest helps your brain heal. Make sure you: Get plenty of sleep at night. Most adults should get at least 7-9 hours of sleep each night. Rest during the day. Take naps or rest breaks when you feel tired. Do not do high-risk activities that could cause a second concussion, such as riding a bike or playing sports. Having another concussion before the first one has healed can be harmful. General instructions Do not drink alcohol  until your health care provider says that you can. Keep track of how severe your symptoms are and how often they happen. Give this information to your health care provider. Keep all follow-up visits. Your health care provider may need to check you for new or serious symptoms. Where to find more information [employer]: concussionfoundation.org Contact a health care provider if: Your symptoms do not improve. You get injured again. You have unusual behavior changes. Get help right away if: You have a severe or worsening headache. You have weakness or numbness in any part of your body. You vomit repeatedly. You have mental status changes, such as: Confusion. Trouble speaking. Trouble staying awake. Fainting. You have a seizure. These symptoms may be an emergency. Get help right away. Call 911. Do not wait to see if the symptoms will go away. Do not drive yourself to the hospital. Also, get help right away if: You think about hurting yourself or others. Take one of these steps if you feel like you may hurt yourself or others, or have thoughts about taking your own life: Go to your nearest emergency room. Call 911. Call the National Suicide Prevention Lifeline at (938) 772-6867 or 988. This is open 24 hours a day. Text  the Crisis Text Line at 4105883207. This information is not intended to replace advice given to you by your health care provider. Make sure you discuss any questions you have with your health care provider. Document Revised: 04/28/2023 Document Reviewed: 05/22/2021 Elsevier Patient Education  2025 [employer].

## 2023-10-10 ENCOUNTER — Ambulatory Visit

## 2023-10-10 DIAGNOSIS — I6340 Cerebral infarction due to embolism of unspecified cerebral artery: Secondary | ICD-10-CM

## 2023-10-10 LAB — CUP PACEART REMOTE DEVICE CHECK
Date Time Interrogation Session: 20250928234011
Implantable Pulse Generator Implant Date: 20240618

## 2023-10-12 ENCOUNTER — Telehealth: Payer: Self-pay | Admitting: Adult Health

## 2023-10-12 NOTE — Progress Notes (Signed)
 Remote Loop Recorder Transmission

## 2023-10-12 NOTE — Telephone Encounter (Signed)
 Pt called wanting to inform the provider that when asked if she was having nausea or vomiting she stated no. But recently she has developed these symptoms. Pt would like to know what she should do now that this has begun. Please advise.

## 2023-10-13 NOTE — Progress Notes (Signed)
 Remote Loop Recorder Transmission

## 2023-10-13 NOTE — Telephone Encounter (Signed)
 Spoke w/Pt, Pt stated she is very dizzy this morning after waking up to answer phone. Pt stated she had 2 episodes of vomiting, one time after dinner and one was later. Pt stated she took her anti nausea medication and it got better. Pt states she has an appt w/optometrist on 10/6. Informed Pt if nausea/vomiting worsens and medication dos not help, to call our office. Pt voiced understanding and thanks for the call back.

## 2023-10-18 ENCOUNTER — Ambulatory Visit: Payer: Self-pay | Admitting: Cardiovascular Disease

## 2023-10-18 ENCOUNTER — Ambulatory Visit: Admitting: Physician Assistant

## 2023-10-19 ENCOUNTER — Telehealth: Payer: Self-pay | Admitting: Adult Health

## 2023-10-19 ENCOUNTER — Telehealth: Payer: Self-pay

## 2023-10-19 NOTE — Telephone Encounter (Signed)
 LMVM for Cherisse with [facility]/PT returning her call.

## 2023-10-19 NOTE — Telephone Encounter (Signed)
 Copied from CRM [id number]. Topic: General - Other >> Oct 19, 2023  3:54 PM Frederich PARAS wrote: Reason for CRM: pt would like to extend home health pt 1x a week evevery other week.  For a total of 2 visit.  She needs occupational therapy evaluation.  Pt callback # is [phone] [place]

## 2023-10-19 NOTE — Telephone Encounter (Signed)
 Charisse, PT w/ [facility] has called from pt's home , she is asking for a call back with the names of the medications recently called in as a result of pt's headaches and recent falls.  Charisse # with secure vm is [phone]

## 2023-10-20 NOTE — Telephone Encounter (Signed)
 Verbal authorization to requested services can be given. Thanks, BJ

## 2023-10-20 NOTE — Telephone Encounter (Signed)
 I called Cherisse,  and relayed that medication for headaches, topiramate  is 50mg  po bid.  She verbalized that is what pt is taking.  Appreciated call back.

## 2023-10-21 NOTE — Telephone Encounter (Signed)
 Left voicemail for representative to return my call.

## 2023-10-24 NOTE — Telephone Encounter (Signed)
 Verbal authorization given to Charisse, [facility] per Dr. Gib response.

## 2023-10-26 ENCOUNTER — Other Ambulatory Visit: Payer: Self-pay | Admitting: Family Medicine

## 2023-10-26 ENCOUNTER — Ambulatory Visit: Admitting: Internal Medicine

## 2023-10-26 ENCOUNTER — Other Ambulatory Visit

## 2023-10-26 ENCOUNTER — Other Ambulatory Visit: Payer: Self-pay | Admitting: Physician Assistant

## 2023-10-26 ENCOUNTER — Encounter: Payer: Self-pay | Admitting: Internal Medicine

## 2023-10-26 VITALS — BP 134/80 | HR 78 | Ht 61.0 in | Wt 100.0 lb

## 2023-10-26 DIAGNOSIS — E1142 Type 2 diabetes mellitus with diabetic polyneuropathy: Secondary | ICD-10-CM

## 2023-10-26 DIAGNOSIS — Z794 Long term (current) use of insulin: Secondary | ICD-10-CM

## 2023-10-26 DIAGNOSIS — E1159 Type 2 diabetes mellitus with other circulatory complications: Secondary | ICD-10-CM | POA: Diagnosis not present

## 2023-10-26 DIAGNOSIS — E876 Hypokalemia: Secondary | ICD-10-CM

## 2023-10-26 DIAGNOSIS — M25551 Pain in right hip: Secondary | ICD-10-CM

## 2023-10-26 LAB — POCT GLYCOSYLATED HEMOGLOBIN (HGB A1C): Hemoglobin A1C: 6.7 % — AB (ref 4.0–5.6)

## 2023-10-26 MED ORDER — DAPAGLIFLOZIN PROPANEDIOL 10 MG PO TABS: 10.0000 mg | ORAL_TABLET | Freq: Every day | ORAL | 3 refills | Status: DC

## 2023-10-26 MED ORDER — TRULICITY 1.5 MG/0.5ML ~~LOC~~ SOAJ: 1.5000 mg | SUBCUTANEOUS | 3 refills | Status: AC

## 2023-10-26 MED ORDER — INSULIN LISPRO (1 UNIT DIAL) 100 UNIT/ML (KWIKPEN): PEN_INJECTOR | SUBCUTANEOUS | 3 refills | Status: AC

## 2023-10-26 MED ORDER — TOUJEO SOLOSTAR 300 UNIT/ML ~~LOC~~ SOPN
14.0000 [IU] | PEN_INJECTOR | Freq: Every day | SUBCUTANEOUS | 4 refills | Status: AC
Start: 2023-10-26 — End: ?

## 2023-10-26 MED ORDER — INSULIN PEN NEEDLE 32G X 4 MM MISC: 1.0000 | Freq: Four times a day (QID) | 3 refills | Status: AC

## 2023-10-26 NOTE — Patient Instructions (Addendum)
-   Continue Trulicity  1.5 mg weekly  - Continue Toujeo  14 units once daily  - Continue  Farxiga  10 mg, 1 tablet daily  - Continue Humalog  10 units with each meal PLUS the scale below if needed - Humalog  correctional insulin : ADD extra units on insulin  to your meal-time Humalog  dose if your blood sugars are higher than 170. Use the scale below to help guide you:   Blood sugar before meal Number of units to inject  Less than 170 0 unit  171 - 210 1 units  211 - 250 2 units  251 - 290 3 units  291 - 330 4 units  331 - 370 5 units  371 - 410 6 units       HOW TO TREAT LOW BLOOD SUGARS (Blood sugar LESS THAN 70 MG/DL) Please follow the RULE OF 15 for the treatment of hypoglycemia treatment (when your (blood sugars are less than 70 mg/dL)   STEP 1: Take 15 grams of carbohydrates when your blood sugar is low, which includes:  3-4 GLUCOSE TABS  OR 3-4 OZ OF JUICE OR REGULAR SODA OR ONE TUBE OF GLUCOSE GEL    STEP 2: RECHECK blood sugar in 15 MINUTES STEP 3: If your blood sugar is still low at the 15 minute recheck --> then, go back to STEP 1 and treat AGAIN with another 15 grams of carbohydrates.

## 2023-10-26 NOTE — Progress Notes (Signed)
 Name: Sarah [name]  Age/ Sex: 78 y.o., female   MRN/ DOB: [id number], 06/09/1945     PCP: [place], [name], MD   Reason for Endocrinology Evaluation: Type 2 Diabetes Mellitus  Initial Endocrine Consultative Visit: 08/15/2019    PATIENT IDENTIFIER: [name] is a 78 y.o. female with a past medical history of T2DM, CVA, Dyslipidemia  And CAD (S/P PCI 03/2020). The patient has followed with Endocrinology clinic since 08/15/2019 for consultative assistance with management of her diabetes.  DIABETIC HISTORY:  [name] was diagnosed with DM many years ago. Her hemoglobin A1c has ranged from 10.3% in 2020, peaking at 16.3% in 2016.   On her initial visit to our clinic she had an A1c of 12.5 % She was on MDI regimen but was not taking Metformin  due to GI side effects so we stopped it. We adjusted MDI regimen and continued Victoza     Started Farxiga  02/2020    Lives with mother who is 92 yrs ago   She was hospitalized 10/2022 for acute CVA.  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 Continues to follow up with [facility]   Leonore  was discontinued by nephrology  Attempted to switch Victoza  to Mounjaro  02/2023 but this was not covered, and we started her on Trulicity    SUBJECTIVE:   During the last visit (06/29/2023): A1c 6.8 %    Today (10/26/2023): Ms. [name] is here for a follow up on diabetes management. She is accompanied by her sister ( [name])from [place]  today .   She had a follow-up with cardiology for CAD, HTN, and dyslipidemia She also had a follow-up with neurology for history of CVA   The patient did sustain a fall which resulted in worsening headaches She also follows with Ortho care for back pain as well as hip pains, she was started on prednisone  on  09/27/2023  Nausea and vomiting have resolved  Has occasional constipation    HOME DIABETES REGIMEN:  Trulicity  0.75  mg weekly-patient has been taking 1.5 mg dose Toujeo  20 units daily-patient has been taking 14 units Humalog  10 units with each meal  Farxiga  10 mg daily  Correction factor: Humalog  (BG -130/30)     Statin: yes ACE-I/ARB: yes    CONTINUOUS GLUCOSE MONITORING RECORD INTERPRETATION    Dates of Recording: 10/2-10/15/2025  Sensor description:dexcom  Results statistics:   CGM use % of time 97  Average and SD 163/57  Time in range 70%  % Time Above 180 20  % Time above 250 10  % Time Below target 0   Glycemic patterns summary: BGs are optimal overnight and fluctuate throughout the day Hyperglycemic episodes postprandial Hypoglycemic episodes occurred N/A  Overnight periods: Optimal   DIABETIC COMPLICATIONS: Microvascular complications:  Retinopathy, neuropathy  Denies: CKD Last Eye Exam: Completed 12/24/2020  Macrovascular complications:  CVA, CAD (S/P PCI 03/2020 Denies: PVD   HISTORY:  Past Medical History:  Past Medical History:  Diagnosis Date   Bilateral carpal tunnel syndrome 03/27/2019   Boils    Glaucoma    Heart murmur    HTN (hypertension)    Hx of adenomatous colonic polyps    Hyperlipidemia    Osteoporosis    Pneumonia    Stroke (HCC)    Type II or unspecified type diabetes mellitus without mention  of complication, uncontrolled    Past Surgical History:  Past Surgical History:  Procedure Laterality Date   ABDOMINAL HYSTERECTOMY     CARPAL TUNNEL RELEASE     right   COLONOSCOPY  12-10-10   per Dr. Debrah, clear, repeat in 7 yrs    CORONARY STENT INTERVENTION N/A 03/11/2020   Procedure: CORONARY STENT INTERVENTION;  Surgeon: Dann Candyce RAMAN, MD;  Location: [facility] INVASIVE CV LAB;  Service: Cardiovascular;  Laterality: N/A;   CORONARY ULTRASOUND/IVUS N/A 03/11/2020   Procedure: Intravascular Ultrasound/IVUS;  Surgeon:  Dann Candyce RAMAN, MD;  Location: [facility] INVASIVE CV LAB;  Service: Cardiovascular;  Laterality: N/A;   INCISION AND DRAINAGE PERIRECTAL ABSCESS N/A 09/26/2015   Procedure: IRRIGATION AND DEBRIDEMENT PERIRECTAL ABSCESS;  Surgeon: Herlene Beverley Bureau, MD;  Location: [facility] OR;  Service: General;  Laterality: N/A;   KNEE ARTHROSCOPY     right   LEFT HEART CATH AND CORONARY ANGIOGRAPHY N/A 03/11/2020   Procedure: LEFT HEART CATH AND CORONARY ANGIOGRAPHY;  Surgeon: Dann Candyce RAMAN, MD;  Location: [facility] INVASIVE CV LAB;  Service: Cardiovascular;  Laterality: N/A;   LOOP RECORDER INSERTION N/A 10/19/2016   Procedure: LOOP RECORDER INSERTION;  Surgeon: Kelsie Agent, MD;  Location: [facility] INVASIVE CV LAB;  Service: Cardiovascular;  Laterality: N/A;   ORIF ANKLE FRACTURE Right 03/24/2012   Procedure: OPEN REDUCTION INTERNAL FIXATION (ORIF) ANKLE FRACTURE;  Surgeon: Jerona LULLA Sage, MD;  Location: [facility] OR;  Service: Orthopedics;  Laterality: Right;  Open Reduction Internal Fixation Right Bimalleolar ankle fracture   POLYPECTOMY     TEE WITHOUT CARDIOVERSION N/A 10/18/2016   Procedure: TRANSESOPHAGEAL ECHOCARDIOGRAM (TEE);  Surgeon: Okey Vina LULLA, MD;  Location: [facility] ENDOSCOPY;  Service: Cardiovascular;  Laterality: N/A;   TEE WITHOUT CARDIOVERSION N/A 10/14/2022   Procedure: TRANSESOPHAGEAL ECHOCARDIOGRAM;  Surgeon: Pietro Redell RAMAN, MD;  Location: [facility] INVASIVE CV LAB;  Service: Cardiovascular;  Laterality: N/A;   TONSILLECTOMY     Social History:  reports that she quit smoking about 7 years ago. Her smoking use included cigarettes. She has never used smokeless tobacco. She reports that she does not currently use alcohol . She reports that she does not use drugs. Family History:  Family History  Problem Relation Age of Onset   Diabetes Mother    Hypertension Mother    Stroke Father    Stroke Maternal Uncle    Stroke Paternal Uncle    Colon cancer Neg Hx    Esophageal cancer Neg Hx    Rectal cancer Neg Hx    Stomach  cancer Neg Hx    Inflammatory bowel disease Neg Hx    Liver disease Neg Hx    Pancreatic cancer Neg Hx      HOME MEDICATIONS: Allergies as of 10/26/2023       Reactions   Penicillins Other (See Comments)   Does not know reaction        Medication List        Accurate as of October 26, 2023  2:58 PM. If you have any questions, ask your nurse or doctor.          amLODipine  5 MG tablet Commonly known as: NORVASC  Take 1 tablet (5 mg total) by mouth daily.   amLODipine  5 MG tablet Commonly known as: NORVASC  Take 5 mg by mouth daily.   aspirin  EC 81 MG tablet Take 1 tablet (81 mg total) by mouth daily. Swallow whole.   cholecalciferol 25 MCG (1000 UNIT) tablet Commonly known as: VITAMIN D3 Take 1,000  Units by mouth daily.   clopidogrel  75 MG tablet Commonly known as: PLAVIX  TAKE 1 TABLET BY MOUTH DAILY   clopidogrel  75 MG tablet Commonly known as: PLAVIX  Take 75 mg by mouth daily.   Dexcom G7 Receiver Devi USE TO MONITOR GLUCOSE   Dexcom G7 Sensor Misc USE AS DIRECTED   ezetimibe  10 MG tablet Commonly known as: ZETIA  TAKE 1 TABLET BY MOUTH DAILY   ezetimibe  10 MG tablet Commonly known as: ZETIA  Take 10 mg by mouth daily.   Farxiga  10 MG Tabs tablet Generic drug: dapagliflozin  propanediol TAKE 1 TABLET BY MOUTH DAILY   Farxiga  10 MG Tabs tablet Generic drug: dapagliflozin  propanediol Take 10 mg by mouth daily.   furosemide  20 MG tablet Commonly known as: LASIX  Take 1 tablet by mouth daily (only as needed)   insulin  lispro 100 UNIT/ML KwikPen Commonly known as: HumaLOG  KwikPen Max daily 30 units   latanoprost  0.005 % ophthalmic solution Commonly known as: XALATAN  Place 1 drop into both eyes at bedtime.   latanoprost  0.005 % ophthalmic solution Commonly known as: XALATAN  Place 1 drop into both eyes at bedtime.   losartan  50 MG tablet Commonly known as: COZAAR  TAKE 1 TABLET BY MOUTH DAILY   losartan  50 MG tablet Commonly known as:  COZAAR  Take 50 mg by mouth daily.   Magnesium 250 MG Tabs Take 250 mg by mouth daily.   nitroGLYCERIN  0.4 MG SL tablet Commonly known as: NITROSTAT  Place 1 tablet (0.4 mg total) under the tongue every 5 (five) minutes as needed for chest pain. one tab every 5 minutes up to 3 tablets total over 15 minutes.   ondansetron  4 MG tablet Commonly known as: Zofran  Take 1 tablet (4 mg total) by mouth 2 (two) times daily as needed for nausea or vomiting.   ondansetron  4 MG tablet Commonly known as: ZOFRAN  Take 4 mg by mouth 2 (two) times daily as needed.   predniSONE  10 MG tablet Commonly known as: DELTASONE  Take 1 tablet (10 mg total) by mouth daily with breakfast.   rosuvastatin  20 MG tablet Commonly known as: CRESTOR  TAKE 1 TABLET BY MOUTH DAILY   rosuvastatin  20 MG tablet Commonly known as: CRESTOR  Take 20 mg by mouth at bedtime.   sertraline  50 MG tablet Commonly known as: ZOLOFT  TAKE 1 TABLET BY MOUTH DAILY   topiramate  50 MG tablet Commonly known as: Topamax  Take 1 tablet (50 mg total) by mouth 2 (two) times daily.   Toujeo  SoloStar 300 UNIT/ML Solostar Pen Generic drug: insulin  glargine (1 Unit Dial ) Inject 14 Units into the skin daily in the afternoon.   Trulicity  0.75 MG/0.5ML Soaj Generic drug: Dulaglutide  Inject 0.75 mg into the skin once a week.   Trulicity  1.5 MG/0.5ML Soaj Generic drug: Dulaglutide  Inject 1.5 mg into the skin once a week.         OBJECTIVE:   Vital Signs: BP 134/80 (BP Location: Left Arm, Cuff Size: Normal)   Pulse 78   Ht 5' 1 (1.549 m)   Wt 100 lb (45.4 kg)   SpO2 98%   BMI 18.89 kg/m   Wt Readings from Last 3 Encounters:  10/26/23 100 lb (45.4 kg)  10/06/23 103 lb 6.4 oz (46.9 kg)  09/21/23 104 lb 12.8 oz (47.5 kg)     Exam: General: Pt appears well and is in NAD  Lungs: Clear with good BS bilat   Heart: RRR   Extremities: No pretibial edema.   Neuro: MS is good with appropriate  affect, pt is alert and Ox3   DM  foot exam: 10/26/2023 The skin of the feet is intact without sores or ulcerations. The pedal pulses are 1+ on right and 1+ on left. The sensation is decreased  to a screening 5.07, 10 gram monofilament bilaterally    DATA REVIEWED:  Lab Results  Component Value Date   HGBA1C 6.7 (A) 10/26/2023   HGBA1C 6.8 (A) 06/29/2023   HGBA1C 8.5 (A) 03/01/2023     Latest Reference Range & Units 10/26/23 15:16  Microalb, Ur mg/dL 732.6  MICROALB/CREAT RATIO <30 mg/g creat 1,818 (H)  Creatinine, Urine 20 - 275 mg/dL 852    Latest Reference Range & Units 09/16/23 03:22  Sodium 135 - 145 mmol/L 142  Potassium 3.5 - 5.1 mmol/L 4.4  Chloride 98 - 111 mmol/L 110  CO2 22 - 32 mmol/L 22  Glucose 70 - 99 mg/dL 882 (H)  BUN 8 - 23 mg/dL 22  Creatinine 9.55 - 8.99 mg/dL 8.80 (H)  Calcium  8.9 - 10.3 mg/dL 9.6  Anion gap 5 - 15  10  GFR, Estimated >60 mL/min 47 (L)    Old records , labs and images have been reviewed.    ASSESSMENT / PLAN / RECOMMENDATIONS:   1) Type 2 Diabetes Mellitus, optimally controlled, With neuropathic and  macrovascular  complications - Most recent A1c of 6.7%. Goal A1c < 7.0 %.    -A1c continues to be optimal -I am not quite sure what doses of medication she is taking, as on her last visit with me I prescribed Trulicity  0.75 mg dose but today there is Trulicity  1.5 mg dose on her medication list -I have also increased her Toujeo  to 20 units, but the patient states she has been taking 14 units and her medication list 14 units?  I will keep all her medications the same at this time with no changes -She does have home care aide~  8 hours every day during weekdays, but the aide is only able to remind the patient to take the medication but does not administer medications for her  MEDICATIONS: -Continue Trulicity  1.5 mg weekly -Continue Farxiga   10 mg, 1 tablet daily  -Continue Toujeo  14 units daily -Continue Humalog  10 units TIDQAC  -Continue CF : Humalog  (BG-130/30)  TIDQAC   EDUCATION / INSTRUCTIONS: BG monitoring instructions: Patient is instructed to check her blood sugars 3 times a day, before meals  Call [facility] if: BG persistently < 70 I reviewed the Rule of 15 for the treatment of hypoglycemia in detail with the patient. Literature supplied.   2) Diabetic complications:  Eye: there's a questionable diabetic retinopathy from her history Neuro/ Feet: Does have known diabetic peripheral neuropathy .  Renal: Patient does not have known baseline CKD. She   is  on an ACEI/ARB at present.    3) Microalbuminuria :   - MA/CR ratio elevated - Will upsize the importance of optimizing glucose control - Patient is on losartan  50 mg, will increase as below  Medication Stop losartan  50 mg daily Start losartan  100 mg daily   F/U in 6 months    In addition to time spent performing glucose monitor, I spent 25 minutes preparing to see the patient by review of recent labs, imaging and procedures, obtaining and reviewing separately obtained history, communicating with the patient/family or caregiver, ordering medications, tests or procedures, and documenting clinical information in the EHR including the differential Dx, treatment, and any further evaluation and other management  Signed electronically by: Stefano Redgie Butts, MD  [facility] Endocrinology  [facility] [address] [place], [place] [address] Phone: [phone] FAX: [phone]   CC: [place], [name], MD [address] [place] [address] Phone: [phone]  Fax: [phone]  Return to Endocrinology clinic as below: Future Appointments  Date Time Provider Department Center  11/10/2023  7:15 AM CVD HVT DEVICE REMOTES [facility]  12/11/2023  7:15 AM [facility] HVT DEVICE REMOTES [facility]  01/11/2024  7:15 AM [facility] HVT DEVICE REMOTES [facility]  01/19/2024  1:15 PM Whitfield Raisin, NP [facility] None  02/11/2024  7:15 AM  [facility] HVT DEVICE REMOTES [facility]  03/13/2024  7:15 AM [facility] HVT DEVICE REMOTES [facility]  05/21/2024  3:40 PM [facility]-ANNUAL WELLNESS VISIT [facility] [address]

## 2023-10-27 ENCOUNTER — Ambulatory Visit: Payer: Self-pay | Admitting: Internal Medicine

## 2023-10-27 ENCOUNTER — Encounter: Payer: Self-pay | Admitting: Internal Medicine

## 2023-10-27 LAB — MICROALBUMIN / CREATININE URINE RATIO
Creatinine, Urine: 147 mg/dL (ref 20–275)
Microalb Creat Ratio: 1818 mg/g{creat} — ABNORMAL HIGH (ref <30)
Microalb, Ur: 267.3 mg/dL

## 2023-10-27 MED ORDER — LOSARTAN POTASSIUM 100 MG PO TABS: 100.0000 mg | ORAL_TABLET | Freq: Every day | ORAL | 3 refills | Status: AC

## 2023-10-27 NOTE — Telephone Encounter (Signed)
 Please let the patient know that she is leaking too much protein in the urine, this is due to diabetes, the most important thing to do to protect her kidneys is to keep her sugar under control.  I have also increased her losartan  from 50 mg to 100 mg, losartan  does provide extra protection to her kidneys    Thanks

## 2023-10-27 NOTE — Addendum Note (Signed)
 Addended by: SAM DONELL PARAS on: 10/27/2023 10:43 AM   Modules accepted: Orders

## 2023-10-31 ENCOUNTER — Other Ambulatory Visit: Payer: Self-pay | Admitting: Internal Medicine

## 2023-10-31 ENCOUNTER — Telehealth: Payer: Self-pay

## 2023-10-31 NOTE — Telephone Encounter (Signed)
 Please advise.    Copied from CRM [id number]. Topic: Clinical - Home Health Verbal Orders >> Oct 28, 2023  4:54 PM Jasmin G wrote: Caller/Agency: Mr. Soyla from [facility] Callback Number: [phone] Service Requested: Occupational Therapy Frequency: Requested for initial evaluation to be next week Any new concerns about the patient? No

## 2023-10-31 NOTE — Telephone Encounter (Signed)
It is okay to give verbal authorization to requested services. Thanks, BJ 

## 2023-10-31 NOTE — Telephone Encounter (Signed)
 Will from [facility] notified of verbal authorization for requested services per Dr. [place].

## 2023-11-10 ENCOUNTER — Ambulatory Visit (INDEPENDENT_AMBULATORY_CARE_PROVIDER_SITE_OTHER)

## 2023-11-10 DIAGNOSIS — I6340 Cerebral infarction due to embolism of unspecified cerebral artery: Secondary | ICD-10-CM

## 2023-11-10 LAB — CUP PACEART REMOTE DEVICE CHECK
Date Time Interrogation Session: 20251029233619
Implantable Pulse Generator Implant Date: 20240618

## 2023-11-11 ENCOUNTER — Telehealth: Payer: Self-pay

## 2023-11-11 NOTE — Telephone Encounter (Signed)
 Copied from CRM [id number]. Topic: Clinical - Home Health Verbal Orders >> Nov 11, 2023  3:53 PM Macario HERO wrote: Caller/Agency: Will from [facility] Callback Number: [phone] Service Requested: Occupational Therapy Frequency: 1 week 1 Any new concerns about the patient? No

## 2023-11-14 ENCOUNTER — Encounter: Payer: Self-pay | Admitting: Radiology

## 2023-11-15 ENCOUNTER — Encounter: Payer: Self-pay | Admitting: Family Medicine

## 2023-11-15 ENCOUNTER — Telehealth: Payer: Self-pay | Admitting: *Deleted

## 2023-11-15 IMAGING — US US BIOPSY
1 series · 8 of 8 positions shown · non-contrast
Comparison: CT 09/26/2015

CLINICAL DATA: Proteinuria

EXAM:
ULTRASOUND GUIDED RENAL CORE BIOPSY
TECHNIQUE: Survey ultrasound was performed and an appropriate skin entry site
was localized. Site was marked, prepped with Betadine, draped in
usual sterile fashion, infiltrated locally with 1% lidocaine.

[Series 1: us biopsy (kidney) · 8 of 8 slices shown]
[im 1/8]
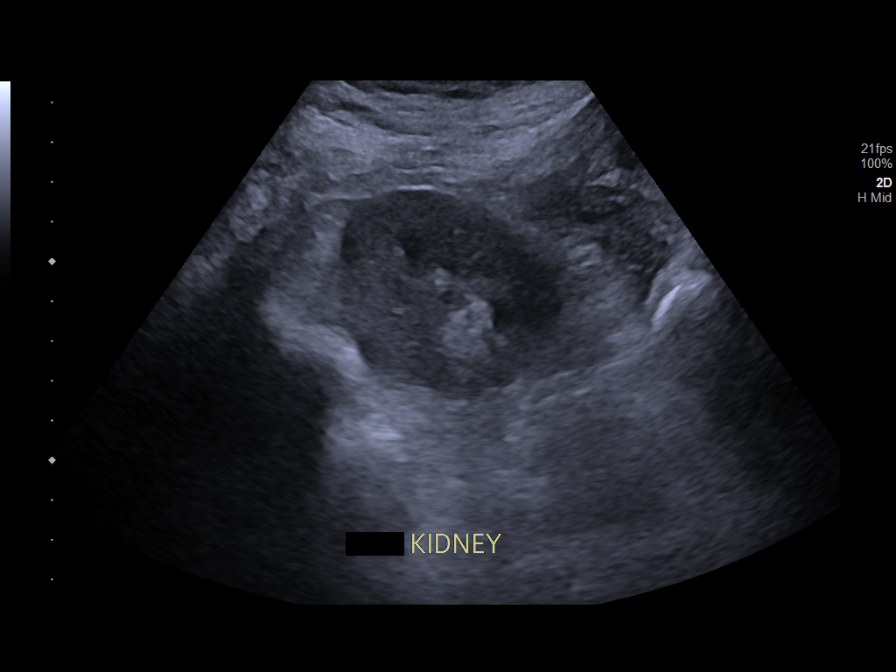
[im 2/8]
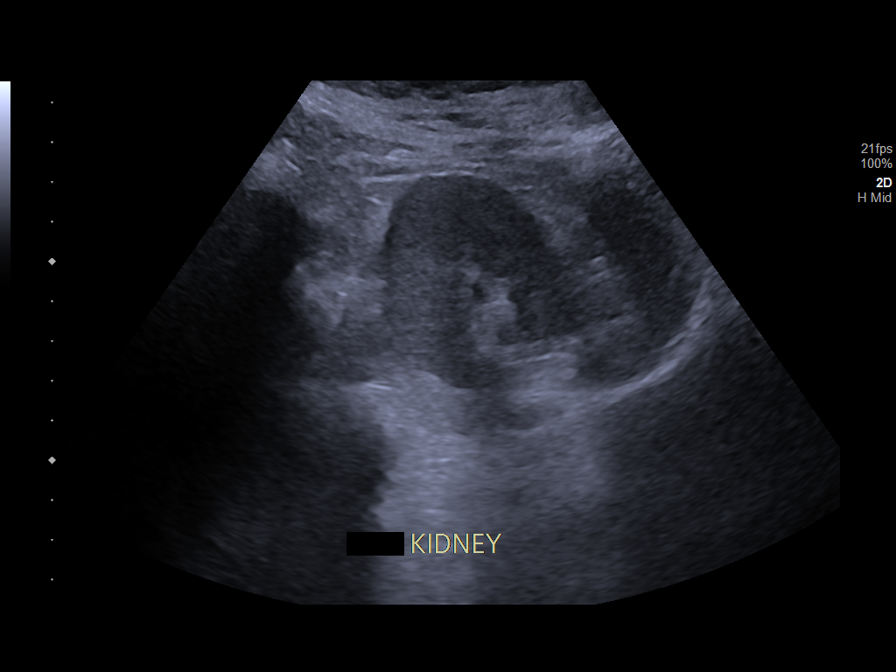
[im 3/8]
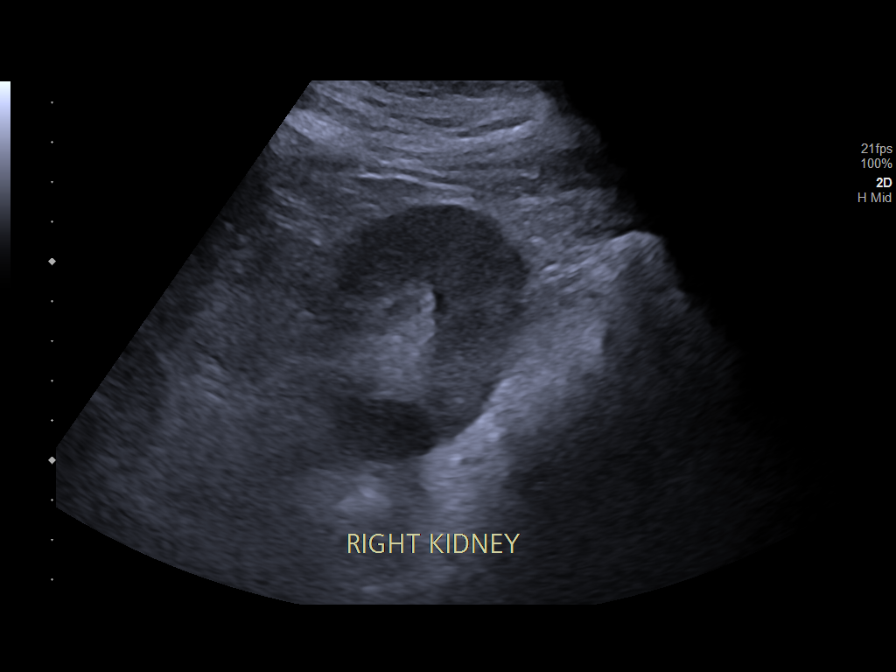
[im 4/8]
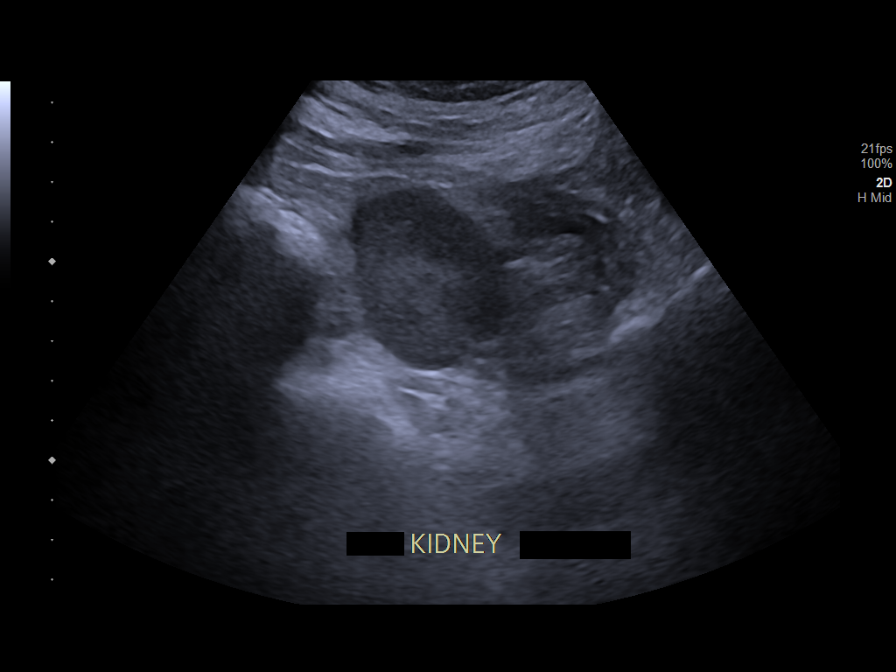
[im 5/8]
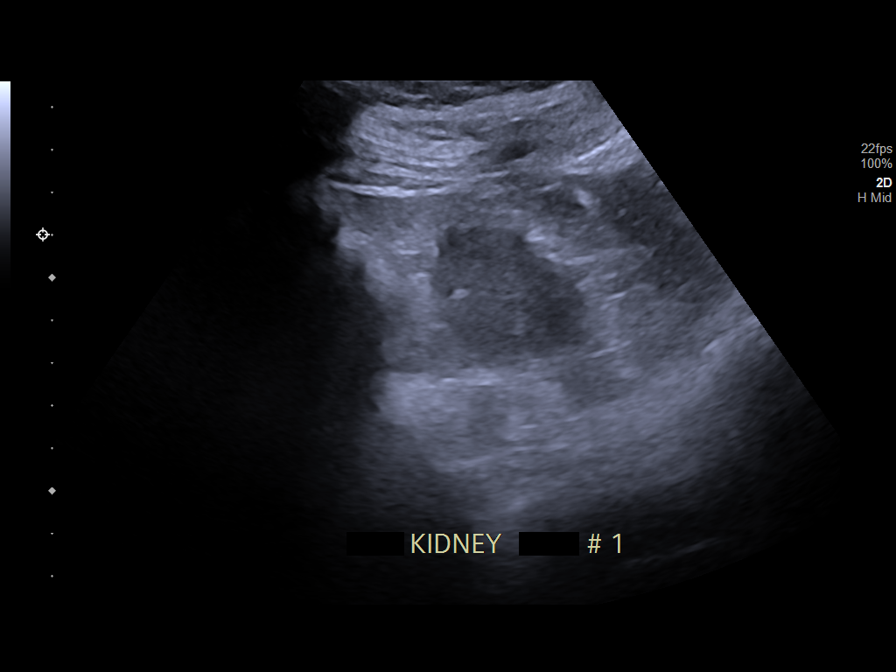
[im 6/8]
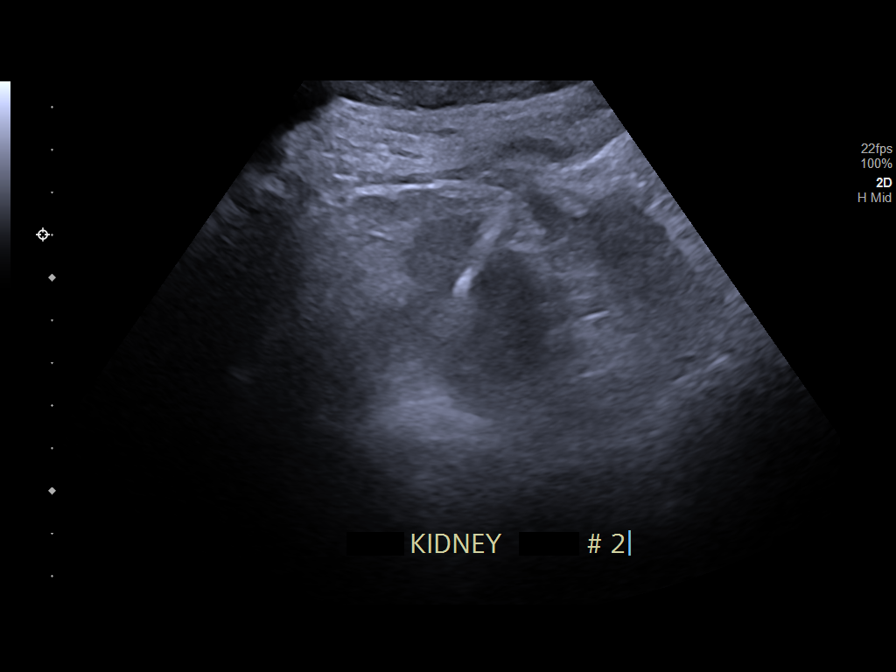
[im 7/8]
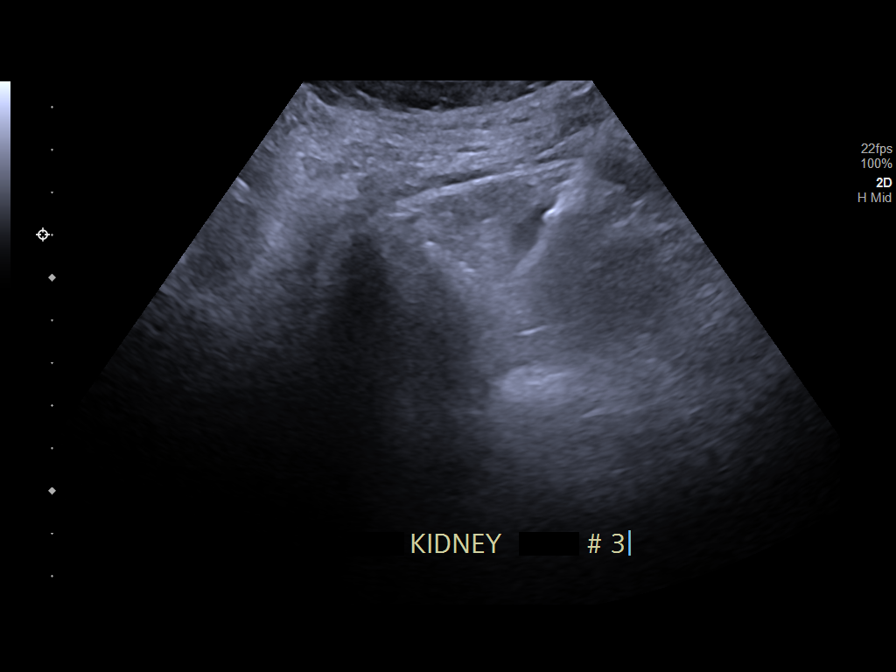
[im 8/8]
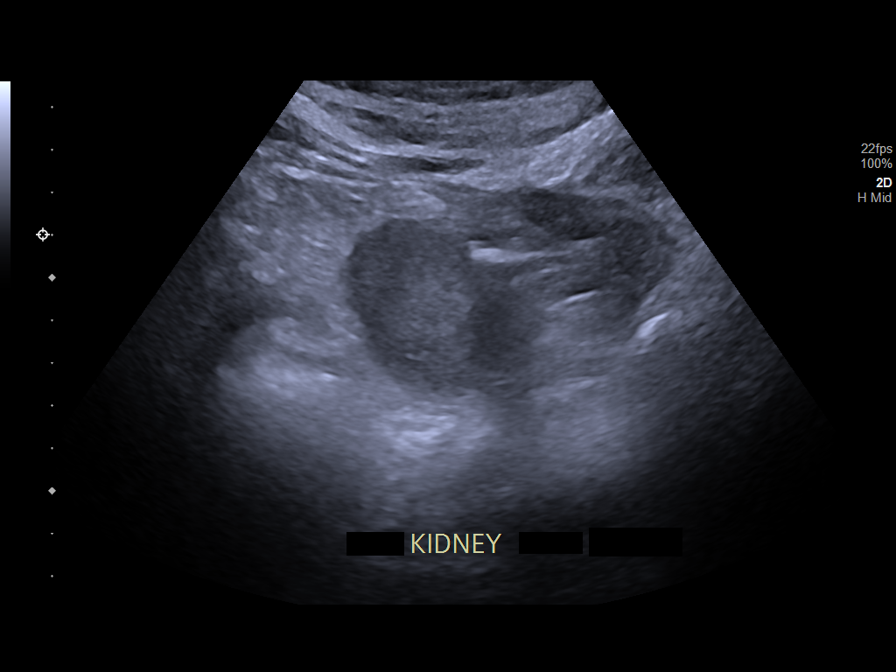

[8 of 8 positions shown; findings below may reference images not displayed]

Intravenous Fentanyl 44mcg and Versed 1.5mg were administered as
conscious sedation during continuous monitoring of the patient's
level of consciousness and physiological / cardiorespiratory status
by the radiology RN, with a total moderate sedation time of 10
minutes.

Under real time ultrasound guidance, a 15 gauge trocar needle was
advanced to the margin of the lower pole of the left kidney for 3
coaxial 16 gauge core biopsy needle passes. The core samples were
submitted to pathology. The patient tolerated procedure well.
Postprocedure images show no hemorrhage or other apparent
complication.

COMPLICATIONS:
None.
IMPRESSION: 1. Technically successful ultrasound-guided core renal biopsy

## 2023-11-15 NOTE — Progress Notes (Signed)
 Remote Loop Recorder Transmission

## 2023-11-15 NOTE — Telephone Encounter (Signed)
Verbal authorization to requested services can be provided. Thanks, BJ

## 2023-11-15 NOTE — Telephone Encounter (Signed)
 Copied from CRM [id number]. Topic: Clinical - Home Health Verbal Orders >> Nov 15, 2023 10:55 AM Tinnie BROCKS wrote: Caller/Agency: Deane, PT with [facility] Callback Number: [phone] Service Requested: Physical Therapy Frequency: 1 time every other wk for 8 wks Any new concerns about the patient? No

## 2023-11-16 NOTE — Telephone Encounter (Signed)
Verbal authorization can be given to requested services. ?Thanks, ?BJ ?

## 2023-11-16 NOTE — Telephone Encounter (Signed)
 Spoke with [employer]. Gave verbal authorization per requested according to Dr. Gib order.

## 2023-11-17 ENCOUNTER — Ambulatory Visit: Payer: Self-pay | Admitting: Cardiovascular Disease

## 2023-11-17 NOTE — Telephone Encounter (Signed)
 Spoke with Will. Authorized services given per Dr. [name].

## 2023-12-02 ENCOUNTER — Encounter: Payer: Self-pay | Admitting: Cardiovascular Disease

## 2023-12-02 ENCOUNTER — Ambulatory Visit: Attending: Cardiovascular Disease | Admitting: Cardiovascular Disease

## 2023-12-02 VITALS — BP 179/84 | HR 75 | Ht 61.0 in | Wt 100.1 lb

## 2023-12-02 DIAGNOSIS — I63411 Cerebral infarction due to embolism of right middle cerebral artery: Secondary | ICD-10-CM | POA: Diagnosis not present

## 2023-12-02 DIAGNOSIS — I639 Cerebral infarction, unspecified: Secondary | ICD-10-CM

## 2023-12-02 NOTE — Progress Notes (Signed)
  Electrophysiology Office Note:    Date:  12/02/2023   ID:  Sarah [name], DOB 12-21-1945, MRN [id number]  PCP:  [name], Sarah G, MD   [facility] Providers Cardiologist:  Sarah DELENA Leavens, MD Cardiology APP:  Sarah [name]     Referring MD: [name], Sarah G, MD   History of Present Illness:    Sarah [name] is a 78 y.o. female with a medical history significant for stroke concerning for a cardioembolic source, referred for loop placement.     She has a history of prior stroke in 2018.  A loop recorder was placed but did not reveal atrial fibrillation.  She has again had cryptogenic stroke; MRI in October 2023 showed a pattern concerning for cardioembolic etiology.  She was admitted in September 2025 due to syncope.  Her loop recorder showed simultaneous AV node slowing and AV block indicative of neurocardiogenic syncope.  Additionally, she was hypotensive and responded to fluids.       EKGs/Labs/Other Studies Reviewed Today:    Echocardiogram:  Reviewed in Epic   Monitors:  [employer] monitor 11/2021 - my interpretation Sinus rhythm, no atrial fibrillation   Stress testing:   Advanced imaging:    Cardiac catherization    EKG:         Physical Exam:    VS:  BP (!) 179/84   Pulse 75   Ht 5' 1 (1.549 m)   Wt 100 lb 1.6 oz (45.4 kg)   SpO2 98%   BMI 18.91 kg/m     Wt Readings from Last 3 Encounters:  12/02/23 100 lb 1.6 oz (45.4 kg)  10/26/23 100 lb (45.4 kg)  10/06/23 103 lb 6.4 oz (46.9 kg)     GEN:  Well nourished, well developed in no acute distress CARDIAC: RRR, no murmurs, rubs, gallops RESPIRATORY:  Normal work of breathing MUSCULOSKELETAL: no edema    ASSESSMENT & PLAN:    Recurrent stroke CMR concerning for cardioembolic etiology Prior ILR years ago did not show evidence of atrial fibrillation Monitoring with an ILR  Presence of implantable loop monitor We will remove the prior loop  recorder at the time of implantation with a new loop recorder.     Signed, Eulas FORBES Furbish, MD  12/02/2023 11:58 AM    [facility]

## 2023-12-02 NOTE — Patient Instructions (Signed)
 Medication Instructions:  Your physician recommends that you continue on your current medications as directed. Please refer to the Current Medication list given to you today.  *If you need a refill on your cardiac medications before your next appointment, please call your pharmacy*  Lab Work: None ordered.  If you have labs (blood work) drawn today and your tests are completely normal, you will receive your results only by: MyChart Message (if you have MyChart) OR A paper copy in the mail If you have any lab test that is abnormal or we need to change your treatment, we will call you to review the results.  Testing/Procedures: None ordered.   Follow-Up: At [facility], you and your health needs are our priority.  As part of our continuing mission to provide you with exceptional heart care, our providers are all part of one team.  This team includes your primary Cardiologist (physician) and Advanced Practice Providers or APPs (Physician Assistants and Nurse Practitioners) who all work together to provide you with the care you need, when you need it.  Your next appointment:   12 months with EP APP

## 2023-12-07 ENCOUNTER — Telehealth: Payer: Self-pay

## 2023-12-07 NOTE — Telephone Encounter (Signed)
 It is ok to give verbal approval to requested services. Thanks, Sarah [name]

## 2023-12-07 NOTE — Telephone Encounter (Signed)
 Copied from CRM #[id number]. Topic: Clinical - Home Health Verbal Orders >> Dec 07, 2023  2:05 PM Anairis L wrote: Caller/Agency: Cherise/[facility]  Callback Number: [phone] Secure VM Service Requested: Physical Therapy Frequency: 1week 1 Any new concerns about the patient? No

## 2023-12-07 NOTE — Telephone Encounter (Signed)
 Verbal orders given to Cherise/[facility] for PT.

## 2023-12-11 ENCOUNTER — Encounter

## 2023-12-12 ENCOUNTER — Ambulatory Visit: Attending: Cardiovascular Disease

## 2023-12-12 DIAGNOSIS — I639 Cerebral infarction, unspecified: Secondary | ICD-10-CM

## 2023-12-13 LAB — CUP PACEART REMOTE DEVICE CHECK
Date Time Interrogation Session: 20251130232647
Implantable Pulse Generator Implant Date: 20240618

## 2023-12-14 ENCOUNTER — Ambulatory Visit: Payer: Self-pay

## 2023-12-14 NOTE — Telephone Encounter (Signed)
 FYI Only or Action Required?: Action required by provider: update on patient condition. Appt 12/4  Patient was last seen in primary care on 09/21/2023 by [name], [name], MD.  Called Nurse Triage reporting Hypertension.  Symptoms began several days ago.  Interventions attempted: Prescription medications: amlodipine , losartan .  Symptoms are: gradually improving.  Triage Disposition: See Physician Within 24 Hours  Patient/caregiver understands and will follow disposition?: Yes  Copied from CRM #[id number]. Topic: Clinical - Red Word Triage >> Dec 14, 2023  4:58 PM Shanda MATSU wrote: Red Word that prompted transfer to Nurse Triage: BP is 200/80 after walking about 20 feet. Reason for Disposition  Systolic BP >= 180 OR Diastolic >= 110  Answer Assessment - Initial Assessment Questions Spoke with Sarah [name] with [facility]. HTN on arrival for PT. Readings as below. Patient was asymptomatic throughout.   Eating a lot of ham from Thanksgiving  Vision issues since her CVA. Eye appt on Friday  No worsening in her previous CVA symptoms. Denies HA, SOB, CP, Dizziness, paresthesias, or swelling to the ext.   Appt with pcp office 12/4 for potential med adjustment. Sister and patient agree to call EMS with ANY symptoms presenting prior to appt.   1. BLOOD PRESSURE: What is your blood pressure? Did you take at least two measurements 5 minutes apart?     170/80 at rest and then 200/80 after 11ft walk, 210/96 after commode, 184/86 after a bit of rest  2. ONSET: When did you take your blood pressure?     Today  3. HOW: How did you take your blood pressure? (e.g., automatic home BP monitor, visiting nurse)     Automatic arm cuff 4. HISTORY: Do you have a history of high blood pressure?     yes 5. MEDICINES: Are you taking any medicines for blood pressure? Have you missed any doses recently?     Amlodipine  and losartan  daily in the morning- no evening meds 6. OTHER SYMPTOMS: Do you  have any symptoms? (e.g., blurred vision, chest pain, difficulty breathing, headache, weakness)     denies  Protocols used: Blood Pressure - High-A-AH

## 2023-12-15 ENCOUNTER — Ambulatory Visit: Admitting: Adult Health

## 2023-12-15 ENCOUNTER — Encounter: Payer: Self-pay | Admitting: Adult Health

## 2023-12-15 ENCOUNTER — Telehealth: Payer: Self-pay | Admitting: *Deleted

## 2023-12-15 ENCOUNTER — Ambulatory Visit: Payer: Self-pay | Admitting: Cardiovascular Disease

## 2023-12-15 VITALS — BP 170/80 | HR 78 | Temp 99.3°F | Ht 61.0 in | Wt 104.0 lb

## 2023-12-15 DIAGNOSIS — I10 Essential (primary) hypertension: Secondary | ICD-10-CM

## 2023-12-15 MED ORDER — AMLODIPINE BESYLATE 10 MG PO TABS: 10.0000 mg | ORAL_TABLET | Freq: Every day | ORAL | 0 refills | Status: DC

## 2023-12-15 NOTE — Progress Notes (Signed)
 Subjective:    Patient ID: Sarah [name], female    DOB: [birth year], 78 y.o.   MRN: [id number]  HPI Discussed the use of AI scribe software for clinical note transcription with the patient, who gave verbal consent to proceed.  History of Present Illness   Sarah [name] is a 78 year old female with hypertension who presents with elevated blood pressure readings.  Yesterday her blood pressure rose to 170 mmHg for several hours, which she associates with recent holiday activities, then improved after her physical therapist visited. This morning her blood pressure was about 150-170 mmHg .   She takes amlodipine  5 mg and losartan  100 mg daily for hypertension, with her sister managing her medication supply. She denies dizziness, lightheadedness, chest pain, or leg swelling   She has vision problems after a prior stroke with damage at the back of the eye and chronic headaches treated by her neurologist with scheduled medications.  At a recent cardiology visit her blood pressure was 179/84 mmHg.        Review of Systems See HPI   Past Medical History:  Diagnosis Date   Bilateral carpal tunnel syndrome 03/27/2019   Boils    Glaucoma    Heart murmur    HTN (hypertension)    Hx of adenomatous colonic polyps    Hyperlipidemia    Osteoporosis    Pneumonia    Stroke (HCC)    Type II or unspecified type diabetes mellitus without mention of complication, uncontrolled     Social History   Socioeconomic History   Marital status: Single    Spouse name: Not on file   Number of children: Not on file   Years of education: Not on file   Highest education level: Associate degree: occupational, scientist, product/process development, or vocational program  Occupational History   Not on file  Tobacco Use   Smoking status: Former    Current packs/day: 0.00    Types: Cigarettes    Quit date: 10/15/2016    Years since quitting: 7.1   Smokeless tobacco: Never   Tobacco comments:    smokes occ.   Vaping  Use   Vaping status: Never Used  Substance and Sexual Activity   Alcohol  use: Not Currently    Comment: occ   Drug use: No   Sexual activity: Not on file  Other Topics Concern   Not on file  Social History Narrative   ** Merged History Encounter **       Caffiene coffee 1 cup in am. Retired from [employer].      Social Drivers of Corporate Investment Banker Strain: Low Risk  (05/16/2023)   Overall Financial Resource Strain (CARDIA)    Difficulty of Paying Living Expenses: Not hard at all  Food Insecurity: No Food Insecurity (09/14/2023)   Hunger Vital Sign    Worried About Running Out of Food in the Last Year: Never true    Ran Out of Food in the Last Year: Never true  Transportation Needs: No Transportation Needs (09/14/2023)   PRAPARE - Administrator, Civil Service (Medical): No    Lack of Transportation (Non-Medical): No  Physical Activity: Insufficiently Active (05/16/2023)   Exercise Vital Sign    Days of Exercise per Week: 2 days    Minutes of Exercise per Session: 60 min  Stress: No Stress Concern Present (05/16/2023)   [employer] of Occupational Health - Occupational Stress Questionnaire    Feeling of Stress :  Not at all  Recent Concern: Stress - Stress Concern Present (05/09/2023)   [employer] of Occupational Health - Occupational Stress Questionnaire    Feeling of Stress : To some extent  Social Connections: Unknown (09/14/2023)   Social Connection and Isolation Panel    Frequency of Communication with Friends and Family: Three times a week    Frequency of Social Gatherings with Friends and Family: More than three times a week    Attends Religious Services: Not on file    Active Member of Clubs or Organizations: Not on file    Attends Banker Meetings: Not on file    Marital Status: Not on file  Intimate Partner Violence: Not At Risk (09/14/2023)   Humiliation, Afraid, Rape, and Kick questionnaire    Fear of Current or  Ex-Partner: No    Emotionally Abused: No    Physically Abused: No    Sexually Abused: No    Past Surgical History:  Procedure Laterality Date   ABDOMINAL HYSTERECTOMY     CARPAL TUNNEL RELEASE     right   COLONOSCOPY  12-10-10   per Dr. Debrah, clear, repeat in 7 yrs    CORONARY STENT INTERVENTION N/A 03/11/2020   Procedure: CORONARY STENT INTERVENTION;  Surgeon: Dann Candyce RAMAN, MD;  Location: [facility] INVASIVE CV LAB;  Service: Cardiovascular;  Laterality: N/A;   CORONARY ULTRASOUND/IVUS N/A 03/11/2020   Procedure: Intravascular Ultrasound/IVUS;  Surgeon: Dann Candyce RAMAN, MD;  Location: [facility] INVASIVE CV LAB;  Service: Cardiovascular;  Laterality: N/A;   INCISION AND DRAINAGE PERIRECTAL ABSCESS N/A 09/26/2015   Procedure: IRRIGATION AND DEBRIDEMENT PERIRECTAL ABSCESS;  Surgeon: Herlene Beverley Bureau, MD;  Location: [facility] OR;  Service: General;  Laterality: N/A;   KNEE ARTHROSCOPY     right   LEFT HEART CATH AND CORONARY ANGIOGRAPHY N/A 03/11/2020   Procedure: LEFT HEART CATH AND CORONARY ANGIOGRAPHY;  Surgeon: Dann Candyce RAMAN, MD;  Location: [facility] INVASIVE CV LAB;  Service: Cardiovascular;  Laterality: N/A;   LOOP RECORDER INSERTION N/A 10/19/2016   Procedure: LOOP RECORDER INSERTION;  Surgeon: Kelsie Agent, MD;  Location: [facility] INVASIVE CV LAB;  Service: Cardiovascular;  Laterality: N/A;   ORIF ANKLE FRACTURE Right 03/24/2012   Procedure: OPEN REDUCTION INTERNAL FIXATION (ORIF) ANKLE FRACTURE;  Surgeon: Jerona LULLA Sage, MD;  Location: [facility] OR;  Service: Orthopedics;  Laterality: Right;  Open Reduction Internal Fixation Right Bimalleolar ankle fracture   POLYPECTOMY     TEE WITHOUT CARDIOVERSION N/A 10/18/2016   Procedure: TRANSESOPHAGEAL ECHOCARDIOGRAM (TEE);  Surgeon: Okey Vina LULLA, MD;  Location: [facility] ENDOSCOPY;  Service: Cardiovascular;  Laterality: N/A;   TEE WITHOUT CARDIOVERSION N/A 10/14/2022   Procedure: TRANSESOPHAGEAL ECHOCARDIOGRAM;  Surgeon: Pietro Redell RAMAN, MD;  Location: [facility] INVASIVE CV  LAB;  Service: Cardiovascular;  Laterality: N/A;   TONSILLECTOMY      Family History  Problem Relation Age of Onset   Diabetes Mother    Hypertension Mother    Stroke Father    Stroke Maternal Uncle    Stroke Paternal Uncle    Colon cancer Neg Hx    Esophageal cancer Neg Hx    Rectal cancer Neg Hx    Stomach cancer Neg Hx    Inflammatory bowel disease Neg Hx    Liver disease Neg Hx    Pancreatic cancer Neg Hx     Allergies  Allergen Reactions   Penicillins Other (See Comments)    Does not know reaction    Current Outpatient Medications on File Prior  to Visit  Medication Sig Dispense Refill   amLODipine  (NORVASC ) 5 MG tablet Take 1 tablet (5 mg total) by mouth daily. 90 tablet 3   aspirin  EC 81 MG tablet Take 1 tablet (81 mg total) by mouth daily. Swallow whole. 30 tablet 12   cholecalciferol (VITAMIN D3) 25 MCG (1000 UNIT) tablet Take 1,000 Units by mouth daily.     clopidogrel  (PLAVIX ) 75 MG tablet Take 75 mg by mouth daily.     Continuous Glucose Receiver (DEXCOM G7 RECEIVER) DEVI USE TO MONITOR GLUCOSE 1 each 0   Continuous Glucose Sensor (DEXCOM G7 SENSOR) MISC USE AS DIRECTED 9 each 3   Dulaglutide  (TRULICITY ) 1.5 MG/0.5ML SOAJ Inject 1.5 mg into the skin once a week. 6 mL 3   ezetimibe  (ZETIA ) 10 MG tablet TAKE 1 TABLET BY MOUTH DAILY 90 tablet 3   ezetimibe  (ZETIA ) 10 MG tablet Take 10 mg by mouth daily.     FARXIGA  10 MG TABS tablet TAKE 1 TABLET BY MOUTH DAILY 90 tablet 3   furosemide  (LASIX ) 20 MG tablet Take 1 tablet by mouth daily (only as needed) 90 tablet 3   insulin  glargine, 1 Unit Dial , (TOUJEO  SOLOSTAR) 300 UNIT/ML Solostar Pen Inject 14 Units into the skin daily in the afternoon. 15 mL 4   insulin  lispro (HUMALOG  KWIKPEN) 100 UNIT/ML KwikPen Max daily 30 units 30 mL 3   Insulin  Pen Needle 32G X 4 MM MISC 1 Device by Does not apply route in the morning, at noon, in the evening, and at bedtime. 400 each 3   latanoprost  (XALATAN ) 0.005 % ophthalmic solution  Place 1 drop into both eyes at bedtime.     losartan  (COZAAR ) 100 MG tablet Take 1 tablet (100 mg total) by mouth daily. 90 tablet 3   Magnesium 250 MG TABS Take 250 mg by mouth daily.     MOUNJARO  5 MG/0.5ML Pen Inject 5 mg into the skin once a week.     nitroGLYCERIN  (NITROSTAT ) 0.4 MG SL tablet Place 1 tablet (0.4 mg total) under the tongue every 5 (five) minutes as needed for chest pain. one tab every 5 minutes up to 3 tablets total over 15 minutes. 25 tablet 3   ondansetron  (ZOFRAN ) 4 MG tablet Take 1 tablet (4 mg total) by mouth 2 (two) times daily as needed for nausea or vomiting. 30 tablet 1   ondansetron  (ZOFRAN ) 4 MG tablet Take 4 mg by mouth 2 (two) times daily as needed.     rosuvastatin  (CRESTOR ) 20 MG tablet TAKE 1 TABLET BY MOUTH DAILY 90 tablet 3   rosuvastatin  (CRESTOR ) 20 MG tablet Take 20 mg by mouth at bedtime.     sertraline  (ZOLOFT ) 50 MG tablet TAKE 1 TABLET BY MOUTH DAILY 90 tablet 3   topiramate  (TOPAMAX ) 50 MG tablet Take 1 tablet (50 mg total) by mouth 2 (two) times daily. 60 tablet 11   No current facility-administered medications on file prior to visit.    BP (!) 180/80   Pulse 78   Temp 99.3 F (37.4 C) (Oral)   Ht 5' 1 (1.549 m)   Wt 104 lb (47.2 kg)   SpO2 97%   BMI 19.65 kg/m       Objective:   Physical Exam Vitals and nursing note reviewed.  Constitutional:      Appearance: Normal appearance.  Cardiovascular:     Rate and Rhythm: Normal rate and regular rhythm.     Pulses: Normal pulses.  Heart sounds: Normal heart sounds.  Pulmonary:     Effort: Pulmonary effort is normal.     Breath sounds: Normal breath sounds.  Skin:    General: Skin is warm and dry.  Neurological:     General: No focal deficit present.     Mental Status: She is alert and oriented to person, place, and time.  Psychiatric:        Mood and Affect: Mood normal.        Behavior: Behavior normal.        Thought Content: Thought content normal.        Judgment:  Judgment normal.        Assessment & Plan:  Assessment and Plan    Essential hypertension Recent elevated readings. Current medications: amlodipine  5 mg, losartan  100 mg.  - BP readings in the office 180/80 and 170/80 - Increased amlodipine  to 10 mg daily. - Sent new prescription for amlodipine  to pharmacy. - Continue home blood pressure monitoring. - Follow up with PCP in 2-3 weeks.      Daelynn Blower, NP

## 2023-12-15 NOTE — Telephone Encounter (Signed)
 Copied from CRM [id number]. Topic: Clinical - Home Health Verbal Orders >> Dec 14, 2023  4:37 PM Dedra NOVAK wrote: Caller/Agency: [place], PT from [place] Number: [phone] Service Requested: Occupational Therapy and Physical Therapy Frequency: PT change frequency to 1w4, Evaluate for OT Any new concerns about the patient? Yes, BP was 170/80 today. Pt told her that has been taking her meds but eating more ham since Thanksgiving.

## 2023-12-15 NOTE — Patient Instructions (Signed)
 Your blood pressure is still pretty high. I want to increase your Norvasc  from 5 mg to 10 mg daily.   Please follow up with Dr. [name] in 2-3 weeks.

## 2023-12-16 NOTE — Progress Notes (Signed)
 Remote Loop Recorder Transmission

## 2023-12-16 NOTE — Telephone Encounter (Signed)
 It seems like the amlodipine  was recently increased from 5 mg to 10 mg. Recommend low-salt diet. Continue monitoring BP regularly. Verbal authorization for requested services can be given. Thanks, BJ

## 2023-12-30 ENCOUNTER — Ambulatory Visit: Admitting: Family Medicine

## 2024-01-04 ENCOUNTER — Ambulatory Visit: Admitting: Family Medicine

## 2024-01-09 ENCOUNTER — Other Ambulatory Visit: Payer: Self-pay | Admitting: Adult Health

## 2024-01-09 DIAGNOSIS — I10 Essential (primary) hypertension: Secondary | ICD-10-CM

## 2024-01-11 ENCOUNTER — Encounter

## 2024-01-12 ENCOUNTER — Ambulatory Visit

## 2024-01-12 DIAGNOSIS — I639 Cerebral infarction, unspecified: Secondary | ICD-10-CM | POA: Diagnosis not present

## 2024-01-13 LAB — CUP PACEART REMOTE DEVICE CHECK
Date Time Interrogation Session: 20251231232350
Implantable Pulse Generator Implant Date: 20240618

## 2024-01-16 ENCOUNTER — Telehealth: Payer: Self-pay

## 2024-01-16 NOTE — Telephone Encounter (Signed)
 Pt sister peggy called back and let us  know that she has received the vm and has let pt caregiver know to make sure pt calls today

## 2024-01-16 NOTE — Telephone Encounter (Addendum)
 ILR: implanted for CVA 01/01/24:  21 sec Pause -noted on monthly send Event consistent with [facility] and true pause - reviewed with tech support they do not feel this was a return with signal drop out.  Consider the entire event true.   LM at both home and mobile (for [name]-sister's number) okay per DPR to speak with her.  Patient to call back and discuss event.  EP Scheduling is holding an appt for patient this Thursday, 01/19/24 in office with Dr. Nancey at 4pm.

## 2024-01-16 NOTE — Telephone Encounter (Signed)
 Spoke with patient.  She denies any symptoms on 12/21 at 8am, may have been sleeping she isn't sure.  Denies any recent falls or syncopal/near syncopal events.   Has ER precautions if any concerns including syncopal/fall events between now and OV on Friday.   She can take the 4pm on Thursday with Dr. Nancey (01/19/24 ) to review event.  Is aware he may discuss need for a pacemaker.

## 2024-01-17 ENCOUNTER — Telehealth: Payer: Self-pay | Admitting: Family Medicine

## 2024-01-17 NOTE — Telephone Encounter (Signed)
 Copied from CRM [id number]. Topic: General - Other >> Jan 17, 2024  1:30 PM Eva FALCON wrote: Reason for CRM: Charisse from [facility] was calling to let Dr. [name] know that OT was not able to be done for this patient due to no insurance approval on file. Pt was discharged from home health PT effective 01/10/24 and was also not able to be seen the week of 12/26/23 due to no insurance approval. Any other questions or concerns please call Charisse [phone].

## 2024-01-17 NOTE — Telephone Encounter (Signed)
 Patient is scheduled for 1/8 at 4.

## 2024-01-17 NOTE — Progress Notes (Signed)
 Remote Loop Recorder Transmission

## 2024-01-19 ENCOUNTER — Ambulatory Visit: Attending: Cardiovascular Disease | Admitting: Cardiovascular Disease

## 2024-01-19 ENCOUNTER — Encounter: Payer: Self-pay | Admitting: Cardiovascular Disease

## 2024-01-19 ENCOUNTER — Ambulatory Visit: Admitting: Adult Health

## 2024-01-19 ENCOUNTER — Encounter: Payer: Self-pay | Admitting: Adult Health

## 2024-01-19 VITALS — BP 150/80 | HR 79 | Ht 60.0 in | Wt 103.0 lb

## 2024-01-19 DIAGNOSIS — I10 Essential (primary) hypertension: Secondary | ICD-10-CM | POA: Diagnosis not present

## 2024-01-19 DIAGNOSIS — I119 Hypertensive heart disease without heart failure: Secondary | ICD-10-CM | POA: Diagnosis not present

## 2024-01-19 NOTE — Progress Notes (Unsigned)
 "  [facility]  PATIENT: Sarah [name] DOB: 05-04-1945    REASON FOR VISIT: Follow-up for stroke right MCA 10/2016 readmitted 02/08/2018 for right parieto-occipital stroke  HISTORY FROM: Patient, sister and chart  Chief complaint: No chief complaint on file.     HPI:  Update 01/19/2024 JM: Patient returns for 69-month follow-up visit.  At prior visit, topiramate  increased to 50 mg twice daily for traumatic headaches post fall with possible postconcussion headache, she also noted dizziness and bilateral blurred vision post fall.  Remains on DAPT, Crestor  and Zetia .  Routinely follows with PCP, endocrinology and cardiology.  Loop recorder showed 21-second pause on 12/21 consistent with CHB and to pause per cardiology, she is scheduled for cardiology visit this afternoon.        History provided for reference purposes only Update 10/06/2023 JM: Patient returns for follow-up visit accompanied by her sister-in-law.  Her main concern today is a fall that occurred in the beginning of the month and hospitalized for 3 days.  Notes reviewed.  Concern of possible vasovagal syncopal episode leading to a fall down a flight of stairs and noted concussion injury.  CT head negative for acute abnormalities.  She was evaluated by EP cardiology, loop recorder interrogated without clinically significant episodes.    Currently, she complains of persistent left-sided headaches, dizziness with position changes and bilateral blurred vision since her fall.  She is currently waiting for [facility] to call her back to make an appointment. Dizziness worse in the morning, especially when she first wakes up and tries to sit up.  She does remain on topiramate  50 mg nightly for headache prevention, headaches very well-controlled prior to her fall.  Thankfully, she has not had any additional falls since that time.  She is currently working with home health PT and continues to use a  rollator walker at all times.  She was advised to monitor blood pressure at home and just obtained a blood pressure cuff from [employer] today.  She does admit to poor water intake, primarily drinks coffee and soda.  No new stroke/TIA symptoms. Remains on DAPT, Crestor  and Zetia  without side effects.  Routinely follows with PCP Dr. [name] for stroke risk factor management.  Update 03/17/2023 JM: Patient returns for follow-up visit unaccompanied, care aide waiting in lobby. Main concern today is memory difficulties over the past month, primarily short-term.  Has difficulty remembering what was recently said or recent conversations, having difficulty remembering days of the week.  Denies any recent changes to medications (endo did recently start Trulicity  but she just received rx and has not yet started), denies any changes in sleep or stress levels, denies depression/anxiety, no recent illness.  Continues to live with her mother who has been having her own difficulties with memory over the past couple of months (mother is 14 yo).  Able to maintain ADLs independently, sister assists with putting pills in pill boxes, will sometimes miss dosages as she doesn't feel like taking as she is taking so many medications. She does not drive.  Continues to have home health aide.  Continues to have gait difficulty with RLE weakness and use of cane, questions restart in [facility] therapy as she feels she is moving slower, no recent falls.  Does not do routine memory or physical exercises.  Denies any new stroke/TIA symptoms such as weakness, speech/language difficulties, numbness/tingling or vision changes.  Denies any recent headaches, remains on topiramate  50 mg nightly. Remains on DAPT, Crestor  and Zetia .  Had follow-up visit with cardiology last month, note reviewed, advised indefinite DAPT. Routinely follows with PCP and endocrinology.  Update 11/17/2022 JM: Patient is being seen for sooner scheduled visit due to recent stroke.  She  presented to ED on 10/11/2022 due to new onset right sided leg weakness with fall.  Stroke workup revealed several small infarcts at left brain watershed areas and left cerebellum, etiology concerning for cardioembolic source.  CTA showed moderate to advanced bilateral MCA stenosis.  2D echo showed hyperechoic mass on left coronary cusp, TEE showed nodular density left coronary cusp (possibly nodular calcification) and mobile density right coronary cusp (possible vegetation vs lambl's excrescence).  On Plavix  PTA with noted noncompliance, recommended aspirin  and Brilinta  for 1 month then switch to aspirin  and Plavix , if recurrent stroke while on DAPT, recommended consideration of anticoagulation, recommended outpatient cardiology follow-up.  New ILR placed 06/2022, interrogated without evidence of A-fib.  LDL 44, A1c 12.2. Continued home dose Crestor  and Zetia .  Recommended close PCP follow-up for better DM control.  Therapies recommended home health therapies.  Reports gradually recovering since discharge.  Still has some right-sided weakness but gradually improving.  Continues working with home health PT.  Using rollator walker at all times, no recent falls.  Living with 57 year old mother, maintains ADLs independently, does have aide assistance and sister in law who lives close by to assist with IADLs and transportation as patient does not drive (sister in law waiting in lobby today). Hx of chronic frontal headaches previously well managed on topiramate  25 mg nightly but worsened since stroke, now occurring about every other day, still frontal and behind eyes, associated with photophobia, previously with nausea but not recently. Questions if topiramate  dosage can be increased. Denies OTC pain reliever use. Headaches typically last about 20 minutes and then gradually improve.   Completed 1 month of Brilinta  and aspirin , now on aspirin  and Plavix  without side effects as well as Crestor  and Zetia  without side  effects Has since had follow-up with endocrinology who noted higher A1c due to medication nonadherence, adjustments to antidiabetic regimen made, Follow-up in February. Reports glucose levels have been good, reports using Dexcom at home but does not have monitor with her today and she is waiting on new sensors. Sister and aide assist with medication management.  Has not yet had follow-up with cardiology, loop recorder monitored which has not shown atrial fibrillation thus far Sleep study completed 09/2022 without any significant sleep apnea  Update 09/07/2022 JM: Returns for follow-up visit unaccompanied.  Residual gait and balance impairment and at times unsteadiness, some improvement since prior visit. Completed PT last week, noted some benefit. Continues to do HEP.  Ambulates with cane, denies any recent falls.  Denies new stroke/TIA symptoms.  At prior visit, complained of daily headaches and initiated topiramate  25mg  nightly, has not had any recent headaches, tolerating topamax  well.  Also evaluated by Dr. Buck for possible underlying sleep apnea, sleep study scheduled 9/22.   Reports compliance on Plavix , atorvastatin  and Zetia .  Routinely follows with PCP and endocrinology for stroke risk factor management.  Continues to have decreased vision in left eye, routinely followed by ophthalmology, was told she had blood behind her eye, plans to f/u towards the end of this year.   Update 03/09/2022 JM: Overall stable from stroke standpoint without any new or worsening stroke/TIA symptoms.  Reports residual gait difficulty and imbalance, has improved some since prior visit, has graduated from walker to cane. Still feels unsteady at times when ambulating.  Working with therapy at home. Denies any recent falls.   Continues to experience headaches, have been every other day if not daily. Can occur mid afternoon typically, located frontal and behind eyes. Questions if due to her vision, can feel eye  fatigue/strain after reading for prolonged period of time or watching television, has not noticed worsening of headaches around this time. Has f/u with eye doctor in April. Denies taking OTC medications to help with headaches.  She also continues to complain of daytime fatigue.  Compliant on Plavix , atorvastatin  and Zetia  - currently prescribed 2x 40mg  atorvastatin  tablets (due to difficulty swallowing 80mg  tablet) but believes only take 40mg  per day Blood pressure typically well controlled, slightly on higher side today, monitored by [facility] therapy and has been stable Routinely follows with PCP and endocrinology Cardiac monitor negative for atrial fibrillation  Update 11/04/2021 JM: Patient returns for recent hospitalization.  She is accompanied by her sister. She presented to ED on 10/13 with acute encephalopathy with memory lapses and feeling unsteady/wobbly with walking and found to have punctate left cerebellar and left parietal lobe infarctions likely incidental finding on MRI during encephalopathy work-up, etiology unclear possibly cardioembolic pattern.  MRA negative LVO, bilateral M1 segment stenosis.  Carotid Doppler unremarkable.  EEG normal.  EF 60 to 65%.  Recommend 30-day event monitor to evaluate for A-fib, previously had loop recorder without evidence of A-fib.  LDL 227.  A1c 12.6.  Recommended DAPT for 3 weeks then Plavix  alone and as well as continuation of home dose Zetia  and atorvastatin  and recommended follow-up outpatient with lipid clinic for further lipid management.  Also advised outpatient PCP follow-up for better DM control.  She was evaluated by therapies, initially recommended CIR but due to improvement during hospitalization, recommendations changed to outpatient therapy and 24/7 care/supervision.   Reports since discharge, she has been experiencing generalized weakness sensation with increased fatigue and awakening about 3 times per week with left-sided headaches, these usually  get better throughout the day, will use Tylenol  if needed with benefit (denies overuse).  She also reports some visual changes in her left eye, has appt with ophthalmologist Dr. Loreli on Monday.  She denies any specific weakness or speech difficulty.  Does have some gait impairment and currently ambulating with RW, denies any recent falls.  She is living at home but family has been providing 24/7 care/assistance.  She is scheduled to start outpatient therapies tomorrow.  Denies any new stroke/TIA symptoms.  She remains on aspirin  and Plavix  as well as atorvastatin  and Zetia , denies side effects -family has been assisting with ensuring medication compliance Blood pressure today 141/72 She has since been seen by PCP and endocrinology Has follow-up visit with cardiology 11/28 - reports she has not heard anything in regards to the cardiac monitor   Update 09/11/2020 JM:, returns for requested visit due to c/o worsening balance over the past 2 months and gradual memory decline. Worsening knee pain after a near fall in June - seen by ortho for left knee arthritis and bursitis. Limiting ambulation due to pain.  She is able to ambulate without assistive device.  Mild decline of memory reported but able to maintain ADLs and IADLs independently.  Sedentary lifestyle without any type of memory or cognitive exercises.  Denies any specific weakness, numbness/tingling, speech difficulty or confusion.  Currently on aspirin , plavix , zetia  and atorvastatin  for secondary stroke prevention and hx of CAD. Blood pressure today 143/72.  Glucose levels routinely monitored which has been stable.  No  further concerns at this time.  Update 06/21/2019:, Sarah [name] is being seen for stroke follow-up unaccompanied.  She has been stable from a stroke standpoint without new or worsening stroke/TIA symptoms.  Prior complaints of worsening imbalance, left arm numbness/tingling and speech difficulty with MRI and MRA stable without acute  abnormalities.  She reports ongoing improvement and continues to do HEP as recommended by therapy at home.  EMG/NCV completed for left arm numbness/tingling with mild bilateral carpal tunnel syndrome but otherwise unremarkable.  Reports left arm symptoms resolved as well as left-sided neck symptoms.  Continues on clopidogrel  and atorvastatin  for secondary stroke prevention.  Recently added Zetia  by PCP due to recent lipid panel showing LDL 115.  Blood pressure today 146/72.  Recent A1c 12.3 with recent adjustments of insulin  regimen and continuation of Metformin  and Victoza  by PCP.  Continues to follow routinely with PCP for HTN, HLD and DM management.  Loop recorder has not shown atrial fibrillation thus far.  No concerns at this time.  Update 02/13/2019: Sarah [name] is a 79 year old female who is being seen today for stroke follow-up.  She has been stable from a stroke standpoint with residual mild stable left homonymous hemianopsia, mild imbalance which she believes has been slightly worsening over the past couple of months along with possible worsening of prior aphasia from stroke in 2018.  She does admit to limited physical activity and does not have an established exercise regimen along with endorsing increased stress currently caring for her mother with increased illness and increased stress due to overall COVID-19 pandemic.  She has recently signed up for [employer] program which will be starting in March.  She also states for the past 2 weeks, she has been experiencing left arm numbness/tingling but will resolve quickly and symptoms intermittent.  She does endorse left-sided neck stiffness that radiates into her upper trapezius and increased pain when looking towards the left.  Denies any increased weakness.  Unable to determine if numbness/tingling symptoms occur in certain positions.  She has not previously experienced this sensation.  Denies any other new or worsening neurological symptoms.  She  did have bilateral cataract surgery in 07/2018 without complication. Continues on Plavix  and atorvastatin  for secondary stroke prevention without side effects.  Blood pressure today 130/60.  Continues to follow with PCP for DM management with recent A1c 9.7.  Loop recorder is not shown atrial fibrillation thus far.  Monitoring/battery life will be completed 10/2019.  She has received her first dose of Covid vaccination and plans on receiving second dose next week.  Denies new or worsening stroke/TIA symptoms.  No further concerns at this time.  06/27/2018 VIRTUAL VISIT: Sarah [name] is being seen today for three-month follow-up visit regarding recent stroke.  Initially scheduled for office visit follow-up but due to COVID-19 safety precautions, visit transition to telemedicine via MyChart with patient's consent.  She has been stable from a stroke standpoint with residual left homonymous hemianopsia and balance difficulties.  She continues to participate in outpatient PT with ongoing improvement.  Continues on Plavix  and atorvastatin  for secondary stroke prevention without reported side effects. Blood pressure monitored at home.  Lab work obtained yesterday with A1c 10.3 (prior 13.5).  She continues to follow with PCP for ongoing management.  Loop recorder has not shown atrial fibrillation thus far.  No further concerns at this time.  Denies new or worsening stroke/TIA symptoms.   Hospital follow-up 03/23/2018 CM: Sarah [name], 79 year old female returns for follow-up with readmission for stroke  1/29/ 2020.  MRI of the brain acute cortical and subcortical RPO infarcts.  She initially presented with left hemianopsia.  She is now on Plavix  and aspirin  for 3 months and then Plavix  alone.  She has not had further stroke or TIA symptoms.  She has minimal bruising and no bleeding.  Hemoglobin A1c 13.5 on hospital admission.  She claims she is trying to get that down to a more reasonable level.  She remains on Lipitor   without myalgias.  Blood pressure in the office today 146/87.  She continues to go to [facility].  She is to start rehab physical therapy and occupational therapy today.  Loop recorder so far no atrial fibrillation.  She is not driving due to her vision.  She returns for reevaluation  Hospital summary: right parieto-occipital infarcts in setting of progressive R MCA stenosis, infarcts secondary to large vessel disease source Resultant L hemianopia  CT head R parietal watershed infarct. Small vessel disease.  MRI  Acute cortical and subcortical R P-O infarcts. Old R CR and R frontal lobe infarcts. Small vessel disease.  MRA head  No LVO. Focal stenosis R M1, R M2/M3, L M1 MRA neck B VA origin atherosclerosis 30-50% 2D Echo  pending  Loop interrogation - last check 1/3 w/o AF - interrogation today without AF LDL 76 HgbA1c 13.5 Lovenox  40 mg sq daily for VTE prophylaxis clopidogrel  75 mg daily prior to admission, now on aspirin  325 mg daily and clopidogrel  75 mg daily. Continue DAPT x 3 months then plavix  alone Therapy recommendations:  OP OT and PT Disposition:  Home Patient advised not to drive      REVIEW OF SYSTEMS: Full 14 system review of systems performed and notable only for those listed, all others are neg:  See HPI  ALLERGIES: Allergies  Allergen Reactions   Penicillins Other (See Comments)    Does not know reaction    HOME MEDICATIONS:   PAST MEDICAL HISTORY: Past Medical History:  Diagnosis Date   Bilateral carpal tunnel syndrome 03/27/2019   Boils    Glaucoma    Heart murmur    HTN (hypertension)    Hx of adenomatous colonic polyps    Hyperlipidemia    Osteoporosis    Pneumonia    Stroke (HCC)    Type II or unspecified type diabetes mellitus without mention of complication, uncontrolled     PAST SURGICAL HISTORY: Past Surgical History:  Procedure Laterality Date   ABDOMINAL HYSTERECTOMY     CARPAL TUNNEL RELEASE     right   COLONOSCOPY   12-10-10   per Dr. Debrah, clear, repeat in 7 yrs    CORONARY STENT INTERVENTION N/A 03/11/2020   Procedure: CORONARY STENT INTERVENTION;  Surgeon: Dann Candyce RAMAN, MD;  Location: [facility] INVASIVE CV LAB;  Service: Cardiovascular;  Laterality: N/A;   CORONARY ULTRASOUND/IVUS N/A 03/11/2020   Procedure: Intravascular Ultrasound/IVUS;  Surgeon: Dann Candyce RAMAN, MD;  Location: [facility] INVASIVE CV LAB;  Service: Cardiovascular;  Laterality: N/A;   INCISION AND DRAINAGE PERIRECTAL ABSCESS N/A 09/26/2015   Procedure: IRRIGATION AND DEBRIDEMENT PERIRECTAL ABSCESS;  Surgeon: Herlene Beverley Bureau, MD;  Location: [facility] OR;  Service: General;  Laterality: N/A;   KNEE ARTHROSCOPY     right   LEFT HEART CATH AND CORONARY ANGIOGRAPHY N/A 03/11/2020   Procedure: LEFT HEART CATH AND CORONARY ANGIOGRAPHY;  Surgeon: Dann Candyce RAMAN, MD;  Location: [facility] INVASIVE CV LAB;  Service: Cardiovascular;  Laterality: N/A;   LOOP RECORDER INSERTION N/A 10/19/2016  Procedure: LOOP RECORDER INSERTION;  Surgeon: Kelsie Agent, MD;  Location: [facility] INVASIVE CV LAB;  Service: Cardiovascular;  Laterality: N/A;   ORIF ANKLE FRACTURE Right 03/24/2012   Procedure: OPEN REDUCTION INTERNAL FIXATION (ORIF) ANKLE FRACTURE;  Surgeon: Jerona LULLA Sage, MD;  Location: [facility] OR;  Service: Orthopedics;  Laterality: Right;  Open Reduction Internal Fixation Right Bimalleolar ankle fracture   POLYPECTOMY     TEE WITHOUT CARDIOVERSION N/A 10/18/2016   Procedure: TRANSESOPHAGEAL ECHOCARDIOGRAM (TEE);  Surgeon: Okey Vina LULLA, MD;  Location: [facility] ENDOSCOPY;  Service: Cardiovascular;  Laterality: N/A;   TEE WITHOUT CARDIOVERSION N/A 10/14/2022   Procedure: TRANSESOPHAGEAL ECHOCARDIOGRAM;  Surgeon: Pietro Redell RAMAN, MD;  Location: [facility] INVASIVE CV LAB;  Service: Cardiovascular;  Laterality: N/A;   TONSILLECTOMY      FAMILY HISTORY: Family History  Problem Relation Age of Onset   Diabetes Mother    Hypertension Mother    Stroke Father    Stroke Maternal Uncle     Stroke Paternal Uncle    Colon cancer Neg Hx    Esophageal cancer Neg Hx    Rectal cancer Neg Hx    Stomach cancer Neg Hx    Inflammatory bowel disease Neg Hx    Liver disease Neg Hx    Pancreatic cancer Neg Hx     SOCIAL HISTORY: Social History   Socioeconomic History   Marital status: Single    Spouse name: Not on file   Number of children: Not on file   Years of education: Not on file   Highest education level: Associate degree: occupational, scientist, product/process development, or vocational program  Occupational History   Not on file  Tobacco Use   Smoking status: Former    Current packs/day: 0.00    Types: Cigarettes    Quit date: 10/15/2016    Years since quitting: 7.2   Smokeless tobacco: Never   Tobacco comments:    smokes occ.   Vaping Use   Vaping status: Never Used  Substance and Sexual Activity   Alcohol  use: Not Currently    Comment: occ   Drug use: No   Sexual activity: Not on file  Other Topics Concern   Not on file  Social History Narrative   ** Merged History Encounter **       Caffiene coffee 1 cup in am. Retired from [employer].      Social Drivers of Health   Tobacco Use: Medium Risk (12/15/2023)   Patient History    Smoking Tobacco Use: Former    Smokeless Tobacco Use: Never    Passive Exposure: Not on file  Financial Resource Strain: Low Risk (05/16/2023)   Overall Financial Resource Strain (CARDIA)    Difficulty of Paying Living Expenses: Not hard at all  Food Insecurity: No Food Insecurity (09/14/2023)   Epic    Worried About Programme Researcher, Broadcasting/film/video in the Last Year: Never true    Ran Out of Food in the Last Year: Never true  Transportation Needs: No Transportation Needs (09/14/2023)   Epic    Lack of Transportation (Medical): No    Lack of Transportation (Non-Medical): No  Physical Activity: Insufficiently Active (05/16/2023)   Exercise Vital Sign    Days of Exercise per Week: 2 days    Minutes of Exercise per Session: 60 min  Stress: No Stress Concern  Present (05/16/2023)   [employer] of Occupational Health - Occupational Stress Questionnaire    Feeling of Stress : Not at all  Recent Concern: Stress -  Stress Concern Present (05/09/2023)   [employer] of Occupational Health - Occupational Stress Questionnaire    Feeling of Stress : To some extent  Social Connections: Unknown (09/14/2023)   Social Connection and Isolation Panel    Frequency of Communication with Friends and Family: Three times a week    Frequency of Social Gatherings with Friends and Family: More than three times a week    Attends Religious Services: Not on file    Active Member of Clubs or Organizations: Not on file    Attends Banker Meetings: Not on file    Marital Status: Not on file  Intimate Partner Violence: Not At Risk (09/14/2023)   Epic    Fear of Current or Ex-Partner: No    Emotionally Abused: No    Physically Abused: No    Sexually Abused: No  Depression (PHQ2-9): Low Risk (09/21/2023)   Depression (PHQ2-9)    PHQ-2 Score: 0  Alcohol  Screen: Low Risk (05/16/2023)   Alcohol  Screen    Last Alcohol  Screening Score (AUDIT): 0  Housing: Low Risk (09/14/2023)   Epic    Unable to Pay for Housing in the Last Year: No    Number of Times Moved in the Last Year: 0    Homeless in the Last Year: No  Utilities: Not At Risk (09/14/2023)   Epic    Threatened with loss of utilities: No  Health Literacy: Adequate Health Literacy (05/16/2023)   B1300 Health Literacy    Frequency of need for help with medical instructions: Never     PHYSICAL EXAM  There were no vitals filed for this visit.   There is no height or weight on file to calculate BMI.  General: well developed, well nourished, very pleasant elderly African-American female, seated, in no evident distress   Neurologic Exam Mental Status: Awake and fully alert. Occasional stuttering of speech/speech hesitancy (chronic).  Oriented to place and time. Recent memory subjectively impaired and  remote memory intact. Attention span, concentration and fund of knowledge appropriate during visit. Mood and affect appropriate.  Cranial Nerves: Pupils equal, briskly reactive to light. Extraocular movements full without nystagmus. Visual fields full to confrontation testing. Hearing intact. Facial sensation intact. Face, tongue, palate moves normally and symmetrically.  Motor: Normal bulk and tone. Normal strength in all tested extremity muscles except 4/5 right hip flexor weakness and mild left grip weakness (chronic) Sensory.: intact to touch , pinprick , position and vibratory sensation.  Coordination: Rapid alternating movements normal in all extremities except slightly decreased left hand dexterity (chronic from prior stroke). Finger-to-nose and heel-to-shin performed accurately bilaterally. Gait and Station: Arises from chair with mild difficulty. Stance is slightly hunched. Gait demonstrates decreased stride length and step height bilaterally with out turning of RLE with mild imbalance and use of cane.  Tandem walk and heel toe not attempted. Reflexes: 1+ and symmetric. Toes downgoing.       DIAGNOSTIC DATA (LABS, IMAGING, TESTING) - I reviewed patient records, labs, notes, testing and imaging myself where available.  Lab Results  Component Value Date   WBC 6.8 09/16/2023   HGB 9.7 (L) 09/16/2023   HCT 30.1 (L) 09/16/2023   MCV 95.9 09/16/2023   PLT 264 09/16/2023      Component Value Date/Time   NA 142 09/16/2023 0322   NA 141 03/17/2023 1501   K 4.4 09/16/2023 0322   CL 110 09/16/2023 0322   CO2 22 09/16/2023 0322   GLUCOSE 117 (H) 09/16/2023 0322  BUN 22 09/16/2023 0322   BUN 20 03/17/2023 1501   CREATININE 1.19 (H) 09/16/2023 0322   CREATININE 0.55 (L) 10/24/2019 1602   CALCIUM  9.6 09/16/2023 0322   PROT 5.7 (L) 09/14/2023 0338   PROT 6.0 03/17/2023 1501   ALBUMIN 2.7 (L) 09/14/2023 0338   ALBUMIN 3.3 (L) 03/17/2023 1501   AST 24 09/14/2023 0338   ALT 26  09/14/2023 0338   ALKPHOS 58 09/14/2023 0338   BILITOT 0.4 09/14/2023 0338   BILITOT <0.2 03/17/2023 1501   GFRNONAA 47 (L) 09/16/2023 0322   GFRNONAA 94 10/24/2019 1602   GFRAA 103 03/04/2020 1025   GFRAA 109 10/24/2019 1602   Lab Results  Component Value Date   CHOL 139 10/12/2022   HDL 79 10/12/2022   LDLCALC 44 10/12/2022   TRIG 79 10/12/2022   CHOLHDL 1.8 10/12/2022   Lab Results  Component Value Date   HGBA1C 6.7 (A) 10/26/2023        ASSESSMENT AND PLAN    Sarah [name] is a 79 y.o. female with history of  several small infarcts at left brain watershed areas and left cerebellum in 10/2022 concerning for cardioembolic source, and hx of punctate left cerebellar and left parietal lobe infarcts in 10/2021 likely incidental finding on MRI during encephalopathy work-up of unclear etiology, hx of right parieto-occipital infarcts in setting of progressive right MCA stenosis in 2020 likely secondary to large vessel disease source and right MCA stroke in 2018 s/p ILR without evidence of A-fib during 4 year monitoring duration. S/p fall 09/2023 with worsening headaches, blurred vision and dizziness likely due to postconcussive syndrome, suspected fall from vasovagal syncopal event    Postconcussive syndrome Traumatic headache - Recommend gradually increasing topiramate  to 50 mg twice daily, instructions provided in AVS, advised to call after several weeks if headaches persist or sooner if difficulty tolerating - Advised to contact ophthalmology again to schedule follow-up visit - Advised to monitor blood pressure closely at home, make position changes slowly and importance of increasing water intake - continue working with PT and use of RW at all times for fall prevention  - Discussed typical recovery time with postconcussive injury - CT head negative, no indication for repeat imaging at this time. Can consider if symptoms should worsen   History of multiple  strokes -Continue gait impairment with RLE weakness - stable -s/p new ILR 06/2022, no evidence of A-fib thus far -continue aspirin , plavix , Crestor  20 mg daily and Zetia  for secondary stroke prevention managed/prescribed by PCP/cardiology (ongoing use of DAPT per cardiology recommendations) -Continue close PCP, endocrinology and cardiology follow-up for aggressive stroke risk factor management maintain blood pressure goal <130/80, diabetes with hemoglobin A1c goal below 7.0% and lipids with LDL cholesterol goal below 70 mg/dL.    Mild cognitive impairment -prior MMSE 25/30 indicating mild cognitive impairment, not repeated today due to time constraints -MRI brain without acute abnormality -EEG no evidence of seizure activity -Lab work reversible causes benign -Discussed importance of routine cognitive exercises and routine physical activity, healthy diet, good sleep habits, medication compliance, and management of stroke risk factors   Frontal headaches (since 10/2021) -continue Topamax  with dosage adjustment noted above -sleep study 09/2022 no significant sleep apnea       Follow up in 3-4 months or call earlier if needed     I personally spent a total of 47 minutes in the care of the patient today including preparing to see the patient, getting/reviewing separately obtained history, performing a  medically appropriate exam/evaluation, counseling and educating, placing orders, and documenting clinical information in the EHR.   Harlene Bogaert, AGNP-BC  [facility] [address] [place], [place] [address]  Phone [phone] Fax [phone] Note: This document was prepared with digital dictation and possible smart phrase technology. Any transcriptional errors that result from this process are unintentional. "

## 2024-01-19 NOTE — Progress Notes (Signed)
 " Electrophysiology Office Note:    Date:  01/19/2024   ID:  Sarah [name], DOB Jun 04, 1945, MRN [id number]  PCP:  [name], Sarah G, MD   [facility] Providers Cardiologist:  Sarah DELENA Leavens, MD Cardiology APP:  Parthenia Olivia CHRISTELLA DEVONNA     Referring MD: [name], Sarah G, MD   History of Present Illness:    Sarah [name] is a 79 y.o. female with a medical history significant for stroke concerning for a cardioembolic source, referred for loop placement.     She has a history of prior stroke in 2018.  A loop recorder was placed but did not reveal atrial fibrillation.  She has again had cryptogenic stroke; MRI in October 2023 showed a pattern concerning for cardioembolic etiology.  She was admitted in September 2025 due to syncope.  Her loop recorder showed simultaneous AV node slowing and AV block indicative of neurocardiogenic syncope.  She did have a significantly prolonged pause at that time. She was hypotensive and responded to fluids.  On December 21, her loop recorder showed AV block with a pause of 21 seconds.  She does not recall what she was doing at the time and is not sure if she was awake or asleep.  She does not recall having any symptoms or at all and does not recall passing out.       EKGs/Labs/Other Studies Reviewed Today:    Echocardiogram:  Reviewed in Epic   Monitors:  [employer] monitor 11/2021 - my interpretation Sinus rhythm, no atrial fibrillation   Stress testing:   Advanced imaging:    Cardiac catherization    EKG:   EKG Interpretation Date/Time:  Thursday January 19 2024 16:35:16 EST Ventricular Rate:  79 PR Interval:  216 QRS Duration:  76 QT Interval:  360 QTC Calculation: 412 R Axis:   29  Text Interpretation: Sinus rhythm with 1st degree A-V block Possible Inferior infarct , age undetermined When compared with ECG of 11-Oct-2022 18:58, No significant change was found Confirmed by Nancey Scotts [phone])  on 01/19/2024 4:46:35 PM     Physical Exam:    VS:  BP (!) 150/80 (BP Location: Left Arm, Patient Position: Sitting, Cuff Size: Normal)   Pulse 79   Ht 5' (1.524 m)   Wt 103 lb (46.7 kg)   SpO2 97%   BMI 20.12 kg/m     Wt Readings from Last 3 Encounters:  01/19/24 103 lb (46.7 kg)  12/15/23 104 lb (47.2 kg)  12/02/23 100 lb 1.6 oz (45.4 kg)     GEN:  Well nourished, well developed in no acute distress CARDIAC: RRR, no murmurs, rubs, gallops RESPIRATORY:  Normal work of breathing MUSCULOSKELETAL: no edema    ASSESSMENT & PLAN:    Recurrent stroke CMR concerning for cardioembolic etiology Prior ILR years ago did not show evidence of atrial fibrillation Monitoring with an ILR  Presence of implantable loop monitor We will remove the prior loop recorder at the time of implantation with a new loop recorder.  Neurocardiogenic syncope She has had prolonged pauses --over 10 seconds while awake We discussed the relative indication for pacemaker, which may reduce her symptoms.  Syncope would be unavoidable with a pause of > 10 seconds.  I discussed the indication for the procedure and the logistics, risks, potential benefit, and after care. I specifically explained that risks include but are not limited to infection, bleeding,damage to blood vessels, lung, and the heart -- but risk of prolonged hospitalization, need  for surgery, or the event of stroke, heart attack, or death are low but not zero.    Signed, Eulas FORBES Furbish, MD  01/19/2024 5:09 PM    [facility] "

## 2024-01-19 NOTE — Patient Instructions (Signed)
 Medication Instructions:  Your physician recommends that you continue on your current medications as directed. Please refer to the Current Medication list given to you today.  *If you need a refill on your cardiac medications before your next appointment, please call your pharmacy*  Lab Work: None ordered.  If you have labs (blood work) drawn today and your tests are completely normal, you will receive your results only by: MyChart Message (if you have MyChart) OR A paper copy in the mail If you have any lab test that is abnormal or we need to change your treatment, we will call you to review the results.  Testing/Procedures: None ordered.   Follow-Up: At [facility], you and your health needs are our priority.  As part of our continuing mission to provide you with exceptional heart care, our providers are all part of one team.  This team includes your primary Cardiologist (physician) and Advanced Practice Providers or APPs (Physician Assistants and Nurse Practitioners) who all work together to provide you with the care you need, when you need it.  Your next appointment:   Please let us  know if you decide to proceed with pacemaker

## 2024-01-23 ENCOUNTER — Encounter: Payer: Self-pay | Admitting: Adult Health

## 2024-01-30 NOTE — Progress Notes (Unsigned)
 "  [facility]  PATIENT: Sarah [name] DOB: [birth year]    REASON FOR VISIT: Follow-up for stroke right MCA 10/2016 readmitted 02/08/2018 for right parieto-occipital stroke  HISTORY FROM: Patient, sister and chart  Chief complaint: No chief complaint on file.     HPI:  Update 01/30/2024 JM: Patient returns for 25-month follow-up visit.  At prior visit, topiramate  increased to 50 mg twice daily for traumatic headaches post fall with possible postconcussion headache, she also noted dizziness and bilateral blurred vision post fall.  Remains on DAPT, Crestor  and Zetia .  Routinely follows with PCP, endocrinology and cardiology.  Loop recorder showed 21-second pause on 12/21 consistent with CHB and to pause per cardiology, she is scheduled for cardiology visit this afternoon.        History provided for reference purposes only Update 10/06/2023 JM: Patient returns for follow-up visit accompanied by her sister-in-law.  Her main concern today is a fall that occurred in the beginning of the month and hospitalized for 3 days.  Notes reviewed.  Concern of possible vasovagal syncopal episode leading to a fall down a flight of stairs and noted concussion injury.  CT head negative for acute abnormalities.  She was evaluated by EP cardiology, loop recorder interrogated without clinically significant episodes.    Currently, she complains of persistent left-sided headaches, dizziness with position changes and bilateral blurred vision since her fall.  She is currently waiting for [facility] to call her back to make an appointment. Dizziness worse in the morning, especially when she first wakes up and tries to sit up.  She does remain on topiramate  50 mg nightly for headache prevention, headaches very well-controlled prior to her fall.  Thankfully, she has not had any additional falls since that time.  She is currently working with home health PT and continues to use a  rollator walker at all times.  She was advised to monitor blood pressure at home and just obtained a blood pressure cuff from [employer] today.  She does admit to poor water intake, primarily drinks coffee and soda.  No new stroke/TIA symptoms. Remains on DAPT, Crestor  and Zetia  without side effects.  Routinely follows with PCP Dr. [name] for stroke risk factor management.  Update 03/17/2023 JM: Patient returns for follow-up visit unaccompanied, care aide waiting in lobby. Main concern today is memory difficulties over the past month, primarily short-term.  Has difficulty remembering what was recently said or recent conversations, having difficulty remembering days of the week.  Denies any recent changes to medications (endo did recently start Trulicity  but she just received rx and has not yet started), denies any changes in sleep or stress levels, denies depression/anxiety, no recent illness.  Continues to live with her mother who has been having her own difficulties with memory over the past couple of months (mother is 77 yo).  Able to maintain ADLs independently, sister assists with putting pills in pill boxes, will sometimes miss dosages as she doesn't feel like taking as she is taking so many medications. She does not drive.  Continues to have home health aide.  Continues to have gait difficulty with RLE weakness and use of cane, questions restart in [facility] therapy as she feels she is moving slower, no recent falls.  Does not do routine memory or physical exercises.  Denies any new stroke/TIA symptoms such as weakness, speech/language difficulties, numbness/tingling or vision changes.  Denies any recent headaches, remains on topiramate  50 mg nightly. Remains on DAPT, Crestor  and Zetia .  Had follow-up visit with cardiology last month, note reviewed, advised indefinite DAPT. Routinely follows with PCP and endocrinology.  Update 11/17/2022 JM: Patient is being seen for sooner scheduled visit due to recent stroke.  She  presented to ED on 10/11/2022 due to new onset right sided leg weakness with fall.  Stroke workup revealed several small infarcts at left brain watershed areas and left cerebellum, etiology concerning for cardioembolic source.  CTA showed moderate to advanced bilateral MCA stenosis.  2D echo showed hyperechoic mass on left coronary cusp, TEE showed nodular density left coronary cusp (possibly nodular calcification) and mobile density right coronary cusp (possible vegetation vs lambl's excrescence).  On Plavix  PTA with noted noncompliance, recommended aspirin  and Brilinta  for 1 month then switch to aspirin  and Plavix , if recurrent stroke while on DAPT, recommended consideration of anticoagulation, recommended outpatient cardiology follow-up.  New ILR placed 06/2022, interrogated without evidence of A-fib.  LDL 44, A1c 12.2. Continued home dose Crestor  and Zetia .  Recommended close PCP follow-up for better DM control.  Therapies recommended home health therapies.  Reports gradually recovering since discharge.  Still has some right-sided weakness but gradually improving.  Continues working with home health PT.  Using rollator walker at all times, no recent falls.  Living with 77 year old mother, maintains ADLs independently, does have aide assistance and sister in law who lives close by to assist with IADLs and transportation as patient does not drive (sister in law waiting in lobby today). Hx of chronic frontal headaches previously well managed on topiramate  25 mg nightly but worsened since stroke, now occurring about every other day, still frontal and behind eyes, associated with photophobia, previously with nausea but not recently. Questions if topiramate  dosage can be increased. Denies OTC pain reliever use. Headaches typically last about 20 minutes and then gradually improve.   Completed 1 month of Brilinta  and aspirin , now on aspirin  and Plavix  without side effects as well as Crestor  and Zetia  without side  effects Has since had follow-up with endocrinology who noted higher A1c due to medication nonadherence, adjustments to antidiabetic regimen made, Follow-up in February. Reports glucose levels have been good, reports using Dexcom at home but does not have monitor with her today and she is waiting on new sensors. Sister and aide assist with medication management.  Has not yet had follow-up with cardiology, loop recorder monitored which has not shown atrial fibrillation thus far Sleep study completed 09/2022 without any significant sleep apnea  Update 09/07/2022 JM: Returns for follow-up visit unaccompanied.  Residual gait and balance impairment and at times unsteadiness, some improvement since prior visit. Completed PT last week, noted some benefit. Continues to do HEP.  Ambulates with cane, denies any recent falls.  Denies new stroke/TIA symptoms.  At prior visit, complained of daily headaches and initiated topiramate  25mg  nightly, has not had any recent headaches, tolerating topamax  well.  Also evaluated by Dr. Buck for possible underlying sleep apnea, sleep study scheduled 9/22.   Reports compliance on Plavix , atorvastatin  and Zetia .  Routinely follows with PCP and endocrinology for stroke risk factor management.  Continues to have decreased vision in left eye, routinely followed by ophthalmology, was told she had blood behind her eye, plans to f/u towards the end of this year.   Update 03/09/2022 JM: Overall stable from stroke standpoint without any new or worsening stroke/TIA symptoms.  Reports residual gait difficulty and imbalance, has improved some since prior visit, has graduated from walker to cane. Still feels unsteady at times when ambulating.  Working with therapy at home. Denies any recent falls.   Continues to experience headaches, have been every other day if not daily. Can occur mid afternoon typically, located frontal and behind eyes. Questions if due to her vision, can feel eye  fatigue/strain after reading for prolonged period of time or watching television, has not noticed worsening of headaches around this time. Has f/u with eye doctor in April. Denies taking OTC medications to help with headaches.  She also continues to complain of daytime fatigue.  Compliant on Plavix , atorvastatin  and Zetia  - currently prescribed 2x 40mg  atorvastatin  tablets (due to difficulty swallowing 80mg  tablet) but believes only take 40mg  per day Blood pressure typically well controlled, slightly on higher side today, monitored by [facility] therapy and has been stable Routinely follows with PCP and endocrinology Cardiac monitor negative for atrial fibrillation  Update 11/04/2021 JM: Patient returns for recent hospitalization.  She is accompanied by her sister. She presented to ED on 10/13 with acute encephalopathy with memory lapses and feeling unsteady/wobbly with walking and found to have punctate left cerebellar and left parietal lobe infarctions likely incidental finding on MRI during encephalopathy work-up, etiology unclear possibly cardioembolic pattern.  MRA negative LVO, bilateral M1 segment stenosis.  Carotid Doppler unremarkable.  EEG normal.  EF 60 to 65%.  Recommend 30-day event monitor to evaluate for A-fib, previously had loop recorder without evidence of A-fib.  LDL 227.  A1c 12.6.  Recommended DAPT for 3 weeks then Plavix  alone and as well as continuation of home dose Zetia  and atorvastatin  and recommended follow-up outpatient with lipid clinic for further lipid management.  Also advised outpatient PCP follow-up for better DM control.  She was evaluated by therapies, initially recommended CIR but due to improvement during hospitalization, recommendations changed to outpatient therapy and 24/7 care/supervision.   Reports since discharge, she has been experiencing generalized weakness sensation with increased fatigue and awakening about 3 times per week with left-sided headaches, these usually  get better throughout the day, will use Tylenol  if needed with benefit (denies overuse).  She also reports some visual changes in her left eye, has appt with ophthalmologist Dr. Loreli on Monday.  She denies any specific weakness or speech difficulty.  Does have some gait impairment and currently ambulating with RW, denies any recent falls.  She is living at home but family has been providing 24/7 care/assistance.  She is scheduled to start outpatient therapies tomorrow.  Denies any new stroke/TIA symptoms.  She remains on aspirin  and Plavix  as well as atorvastatin  and Zetia , denies side effects -family has been assisting with ensuring medication compliance Blood pressure today 141/72 She has since been seen by PCP and endocrinology Has follow-up visit with cardiology 11/28 - reports she has not heard anything in regards to the cardiac monitor   Update 09/11/2020 JM:, returns for requested visit due to c/o worsening balance over the past 2 months and gradual memory decline. Worsening knee pain after a near fall in June - seen by ortho for left knee arthritis and bursitis. Limiting ambulation due to pain.  She is able to ambulate without assistive device.  Mild decline of memory reported but able to maintain ADLs and IADLs independently.  Sedentary lifestyle without any type of memory or cognitive exercises.  Denies any specific weakness, numbness/tingling, speech difficulty or confusion.  Currently on aspirin , plavix , zetia  and atorvastatin  for secondary stroke prevention and hx of CAD. Blood pressure today 143/72.  Glucose levels routinely monitored which has been stable.  No  further concerns at this time.  Update 06/21/2019:, Ms. Klinkner is being seen for stroke follow-up unaccompanied.  She has been stable from a stroke standpoint without new or worsening stroke/TIA symptoms.  Prior complaints of worsening imbalance, left arm numbness/tingling and speech difficulty with MRI and MRA stable without acute  abnormalities.  She reports ongoing improvement and continues to do HEP as recommended by therapy at home.  EMG/NCV completed for left arm numbness/tingling with mild bilateral carpal tunnel syndrome but otherwise unremarkable.  Reports left arm symptoms resolved as well as left-sided neck symptoms.  Continues on clopidogrel  and atorvastatin  for secondary stroke prevention.  Recently added Zetia  by PCP due to recent lipid panel showing LDL 115.  Blood pressure today 146/72.  Recent A1c 12.3 with recent adjustments of insulin  regimen and continuation of Metformin  and Victoza  by PCP.  Continues to follow routinely with PCP for HTN, HLD and DM management.  Loop recorder has not shown atrial fibrillation thus far.  No concerns at this time.  Update 02/13/2019: Ms. Scioneaux is a 79 year old female who is being seen today for stroke follow-up.  She has been stable from a stroke standpoint with residual mild stable left homonymous hemianopsia, mild imbalance which she believes has been slightly worsening over the past couple of months along with possible worsening of prior aphasia from stroke in 2018.  She does admit to limited physical activity and does not have an established exercise regimen along with endorsing increased stress currently caring for her mother with increased illness and increased stress due to overall COVID-19 pandemic.  She has recently signed up for [employer] program which will be starting in March.  She also states for the past 2 weeks, she has been experiencing left arm numbness/tingling but will resolve quickly and symptoms intermittent.  She does endorse left-sided neck stiffness that radiates into her upper trapezius and increased pain when looking towards the left.  Denies any increased weakness.  Unable to determine if numbness/tingling symptoms occur in certain positions.  She has not previously experienced this sensation.  Denies any other new or worsening neurological symptoms.  She  did have bilateral cataract surgery in 07/2018 without complication. Continues on Plavix  and atorvastatin  for secondary stroke prevention without side effects.  Blood pressure today 130/60.  Continues to follow with PCP for DM management with recent A1c 9.7.  Loop recorder is not shown atrial fibrillation thus far.  Monitoring/battery life will be completed 10/2019.  She has received her first dose of Covid vaccination and plans on receiving second dose next week.  Denies new or worsening stroke/TIA symptoms.  No further concerns at this time.  06/27/2018 VIRTUAL VISIT: Ms. Argyle is being seen today for three-month follow-up visit regarding recent stroke.  Initially scheduled for office visit follow-up but due to COVID-19 safety precautions, visit transition to telemedicine via MyChart with patient's consent.  She has been stable from a stroke standpoint with residual left homonymous hemianopsia and balance difficulties.  She continues to participate in outpatient PT with ongoing improvement.  Continues on Plavix  and atorvastatin  for secondary stroke prevention without reported side effects. Blood pressure monitored at home.  Lab work obtained yesterday with A1c 10.3 (prior 13.5).  She continues to follow with PCP for ongoing management.  Loop recorder has not shown atrial fibrillation thus far.  No further concerns at this time.  Denies new or worsening stroke/TIA symptoms.   Hospital follow-up 03/23/2018 CM: Ms. Kuhar, 79 year old female returns for follow-up with readmission for stroke  1/29/ 2020.  MRI of the brain acute cortical and subcortical RPO infarcts.  She initially presented with left hemianopsia.  She is now on Plavix  and aspirin  for 3 months and then Plavix  alone.  She has not had further stroke or TIA symptoms.  She has minimal bruising and no bleeding.  Hemoglobin A1c 13.5 on hospital admission.  She claims she is trying to get that down to a more reasonable level.  She remains on Lipitor   without myalgias.  Blood pressure in the office today 146/87.  She continues to go to [facility].  She is to start rehab physical therapy and occupational therapy today.  Loop recorder so far no atrial fibrillation.  She is not driving due to her vision.  She returns for reevaluation  Hospital summary: right parieto-occipital infarcts in setting of progressive R MCA stenosis, infarcts secondary to large vessel disease source Resultant L hemianopia  CT head R parietal watershed infarct. Small vessel disease.  MRI  Acute cortical and subcortical R P-O infarcts. Old R CR and R frontal lobe infarcts. Small vessel disease.  MRA head  No LVO. Focal stenosis R M1, R M2/M3, L M1 MRA neck B VA origin atherosclerosis 30-50% 2D Echo  pending  Loop interrogation - last check 1/3 w/o AF - interrogation today without AF LDL 76 HgbA1c 13.5 Lovenox  40 mg sq daily for VTE prophylaxis clopidogrel  75 mg daily prior to admission, now on aspirin  325 mg daily and clopidogrel  75 mg daily. Continue DAPT x 3 months then plavix  alone Therapy recommendations:  OP OT and PT Disposition:  Home Patient advised not to drive      REVIEW OF SYSTEMS: Full 14 system review of systems performed and notable only for those listed, all others are neg:  See HPI  ALLERGIES: Allergies  Allergen Reactions   Penicillins Other (See Comments)    Does not know reaction    HOME MEDICATIONS:   PAST MEDICAL HISTORY: Past Medical History:  Diagnosis Date   Bilateral carpal tunnel syndrome 03/27/2019   Boils    Glaucoma    Heart murmur    HTN (hypertension)    Hx of adenomatous colonic polyps    Hyperlipidemia    Osteoporosis    Pneumonia    Stroke (HCC)    Type II or unspecified type diabetes mellitus without mention of complication, uncontrolled     PAST SURGICAL HISTORY: Past Surgical History:  Procedure Laterality Date   ABDOMINAL HYSTERECTOMY     CARPAL TUNNEL RELEASE     right   COLONOSCOPY   12-10-10   per Dr. Debrah, clear, repeat in 7 yrs    CORONARY STENT INTERVENTION N/A 03/11/2020   Procedure: CORONARY STENT INTERVENTION;  Surgeon: Dann Candyce RAMAN, MD;  Location[facility] INVASIVE CV LAB;  Service: Cardiovascular;  Laterality: N/A;   CORONARY ULTRASOUND/IVUS N/A 03/11/2020   Procedure: Intravascular Ultrasound/IVUS;  Surgeon: Dann Candyce RAMAN, MD;  Location: [facility] INVASIVE CV LAB;  Service: Cardiovascular;  Laterality: N/A;   INCISION AND DRAINAGE PERIRECTAL ABSCESS N/A 09/26/2015   Procedure: IRRIGATION AND DEBRIDEMENT PERIRECTAL ABSCESS;  Surgeon: Herlene Beverley Bureau, MD;  Location[facility] OR;  Service: General;  Laterality: N/A;   KNEE ARTHROSCOPY     right   LEFT HEART CATH AND CORONARY ANGIOGRAPHY N/A 03/11/2020   Procedure: LEFT HEART CATH AND CORONARY ANGIOGRAPHY;  Surgeon: Dann Candyce RAMAN, MD;  Location: [facility] INVASIVE CV LAB;  Service: Cardiovascular;  Laterality: N/A;   LOOP RECORDER INSERTION N/A 10/19/2016  Procedure: LOOP RECORDER INSERTION;  Surgeon: Kelsie Agent, MD;  Location: [facility] INVASIVE CV LAB;  Service: Cardiovascular;  Laterality: N/A;   ORIF ANKLE FRACTURE Right 03/24/2012   Procedure: OPEN REDUCTION INTERNAL FIXATION (ORIF) ANKLE FRACTURE;  Surgeon: Jerona LULLA Sage, MD;  Location: [facility] OR;  Service: Orthopedics;  Laterality: Right;  Open Reduction Internal Fixation Right Bimalleolar ankle fracture   POLYPECTOMY     TEE WITHOUT CARDIOVERSION N/A 10/18/2016   Procedure: TRANSESOPHAGEAL ECHOCARDIOGRAM (TEE);  Surgeon: Okey Vina LULLA, MD;  Location: [facility] ENDOSCOPY;  Service: Cardiovascular;  Laterality: N/A;   TEE WITHOUT CARDIOVERSION N/A 10/14/2022   Procedure: TRANSESOPHAGEAL ECHOCARDIOGRAM;  Surgeon: Pietro Redell RAMAN, MD;  Location: [facility] INVASIVE CV LAB;  Service: Cardiovascular;  Laterality: N/A;   TONSILLECTOMY      FAMILY HISTORY: Family History  Problem Relation Age of Onset   Diabetes Mother    Hypertension Mother    Stroke Father    Stroke Maternal Uncle     Stroke Paternal Uncle    Colon cancer Neg Hx    Esophageal cancer Neg Hx    Rectal cancer Neg Hx    Stomach cancer Neg Hx    Inflammatory bowel disease Neg Hx    Liver disease Neg Hx    Pancreatic cancer Neg Hx     SOCIAL HISTORY: Social History   Socioeconomic History   Marital status: Single    Spouse name: Not on file   Number of children: Not on file   Years of education: Not on file   Highest education level: Associate degree: occupational, scientist, product/process development, or vocational program  Occupational History   Not on file  Tobacco Use   Smoking status: Former    Current packs/day: 0.00    Types: Cigarettes    Quit date: 10/15/2016    Years since quitting: 7.2   Smokeless tobacco: Never   Tobacco comments:    smokes occ.   Vaping Use   Vaping status: Never Used  Substance and Sexual Activity   Alcohol  use: Not Currently    Comment: occ   Drug use: No   Sexual activity: Not on file  Other Topics Concern   Not on file  Social History Narrative   ** Merged History Encounter **       Caffiene coffee 1 cup in am. Retired from [employer].      Social Drivers of Health   Tobacco Use: Medium Risk (01/19/2024)   Patient History    Smoking Tobacco Use: Former    Smokeless Tobacco Use: Never    Passive Exposure: Not on file  Financial Resource Strain: Low Risk (05/16/2023)   Overall Financial Resource Strain (CARDIA)    Difficulty of Paying Living Expenses: Not hard at all  Food Insecurity: No Food Insecurity (09/14/2023)   Epic    Worried About Programme Researcher, Broadcasting/film/video in the Last Year: Never true    Ran Out of Food in the Last Year: Never true  Transportation Needs: No Transportation Needs (09/14/2023)   Epic    Lack of Transportation (Medical): No    Lack of Transportation (Non-Medical): No  Physical Activity: Insufficiently Active (05/16/2023)   Exercise Vital Sign    Days of Exercise per Week: 2 days    Minutes of Exercise per Session: 60 min  Stress: No Stress Concern  Present (05/16/2023)   [employer] of Occupational Health - Occupational Stress Questionnaire    Feeling of Stress : Not at all  Recent Concern: Stress -  Stress Concern Present (05/09/2023)   [employer] of Occupational Health - Occupational Stress Questionnaire    Feeling of Stress : To some extent  Social Connections: Unknown (09/14/2023)   Social Connection and Isolation Panel    Frequency of Communication with Friends and Family: Three times a week    Frequency of Social Gatherings with Friends and Family: More than three times a week    Attends Religious Services: Not on file    Active Member of Clubs or Organizations: Not on file    Attends Banker Meetings: Not on file    Marital Status: Not on file  Intimate Partner Violence: Not At Risk (09/14/2023)   Epic    Fear of Current or Ex-Partner: No    Emotionally Abused: No    Physically Abused: No    Sexually Abused: No  Depression (PHQ2-9): Low Risk (09/21/2023)   Depression (PHQ2-9)    PHQ-2 Score: 0  Alcohol  Screen: Low Risk (05/16/2023)   Alcohol  Screen    Last Alcohol  Screening Score (AUDIT): 0  Housing: Low Risk (09/14/2023)   Epic    Unable to Pay for Housing in the Last Year: No    Number of Times Moved in the Last Year: 0    Homeless in the Last Year: No  Utilities: Not At Risk (09/14/2023)   Epic    Threatened with loss of utilities: No  Health Literacy: Adequate Health Literacy (05/16/2023)   B1300 Health Literacy    Frequency of need for help with medical instructions: Never     PHYSICAL EXAM  There were no vitals filed for this visit.   There is no height or weight on file to calculate BMI.  General: well developed, well nourished, very pleasant elderly African-American female, seated, in no evident distress   Neurologic Exam Mental Status: Awake and fully alert. Occasional stuttering of speech/speech hesitancy (chronic).  Oriented to place and time. Recent memory subjectively impaired and  remote memory intact. Attention span, concentration and fund of knowledge appropriate during visit. Mood and affect appropriate.  Cranial Nerves: Pupils equal, briskly reactive to light. Extraocular movements full without nystagmus. Visual fields full to confrontation testing. Hearing intact. Facial sensation intact. Face, tongue, palate moves normally and symmetrically.  Motor: Normal bulk and tone. Normal strength in all tested extremity muscles except 4/5 right hip flexor weakness and mild left grip weakness (chronic) Sensory.: intact to touch , pinprick , position and vibratory sensation.  Coordination: Rapid alternating movements normal in all extremities except slightly decreased left hand dexterity (chronic from prior stroke). Finger-to-nose and heel-to-shin performed accurately bilaterally. Gait and Station: Arises from chair with mild difficulty. Stance is slightly hunched. Gait demonstrates decreased stride length and step height bilaterally with out turning of RLE with mild imbalance and use of cane.  Tandem walk and heel toe not attempted. Reflexes: 1+ and symmetric. Toes downgoing.       DIAGNOSTIC DATA (LABS, IMAGING, TESTING) - I reviewed patient records, labs, notes, testing and imaging myself where available.  Lab Results  Component Value Date   WBC 6.8 09/16/2023   HGB 9.7 (L) 09/16/2023   HCT 30.1 (L) 09/16/2023   MCV 95.9 09/16/2023   PLT 264 09/16/2023      Component Value Date/Time   NA 142 09/16/2023 0322   NA 141 03/17/2023 1501   K 4.4 09/16/2023 0322   CL 110 09/16/2023 0322   CO2 22 09/16/2023 0322   GLUCOSE 117 (H) 09/16/2023 0322  BUN 22 09/16/2023 0322   BUN 20 03/17/2023 1501   CREATININE 1.19 (H) 09/16/2023 0322   CREATININE 0.55 (L) 10/24/2019 1602   CALCIUM  9.6 09/16/2023 0322   PROT 5.7 (L) 09/14/2023 0338   PROT 6.0 03/17/2023 1501   ALBUMIN 2.7 (L) 09/14/2023 0338   ALBUMIN 3.3 (L) 03/17/2023 1501   AST 24 09/14/2023 0338   ALT 26  09/14/2023 0338   ALKPHOS 58 09/14/2023 0338   BILITOT 0.4 09/14/2023 0338   BILITOT <0.2 03/17/2023 1501   GFRNONAA 47 (L) 09/16/2023 0322   GFRNONAA 94 10/24/2019 1602   GFRAA 103 03/04/2020 1025   GFRAA 109 10/24/2019 1602   Lab Results  Component Value Date   CHOL 139 10/12/2022   HDL 79 10/12/2022   LDLCALC 44 10/12/2022   TRIG 79 10/12/2022   CHOLHDL 1.8 10/12/2022   Lab Results  Component Value Date   HGBA1C 6.7 (A) 10/26/2023        ASSESSMENT AND PLAN    Ms. ROSELEE TAYLOE is a 79 y.o. female with history of  several small infarcts at left brain watershed areas and left cerebellum in 10/2022 concerning for cardioembolic source, and hx of punctate left cerebellar and left parietal lobe infarcts in 10/2021 likely incidental finding on MRI during encephalopathy work-up of unclear etiology, hx of right parieto-occipital infarcts in setting of progressive right MCA stenosis in 2020 likely secondary to large vessel disease source and right MCA stroke in 2018 s/p ILR without evidence of A-fib during 4 year monitoring duration. S/p fall 09/2023 with worsening headaches, blurred vision and dizziness likely due to postconcussive syndrome, suspected fall from vasovagal syncopal event    Postconcussive syndrome Traumatic headache - Recommend gradually increasing topiramate  to 50 mg twice daily, instructions provided in AVS, advised to call after several weeks if headaches persist or sooner if difficulty tolerating - Advised to contact ophthalmology again to schedule follow-up visit - Advised to monitor blood pressure closely at home, make position changes slowly and importance of increasing water intake - continue working with PT and use of RW at all times for fall prevention  - Discussed typical recovery time with postconcussive injury - CT head negative, no indication for repeat imaging at this time. Can consider if symptoms should worsen   History of multiple  strokes -Continue gait impairment with RLE weakness - stable -s/p new ILR 06/2022, no evidence of A-fib thus far -continue aspirin , plavix , Crestor  20 mg daily and Zetia  for secondary stroke prevention managed/prescribed by PCP/cardiology (ongoing use of DAPT per cardiology recommendations) -Continue close PCP, endocrinology and cardiology follow-up for aggressive stroke risk factor management maintain blood pressure goal <130/80, diabetes with hemoglobin A1c goal below 7.0% and lipids with LDL cholesterol goal below 70 mg/dL.    Mild cognitive impairment -prior MMSE 25/30 indicating mild cognitive impairment, not repeated today due to time constraints -MRI brain without acute abnormality -EEG no evidence of seizure activity -Lab work reversible causes benign -Discussed importance of routine cognitive exercises and routine physical activity, healthy diet, good sleep habits, medication compliance, and management of stroke risk factors   Frontal headaches (since 10/2021) -continue Topamax  with dosage adjustment noted above -sleep study 09/2022 no significant sleep apnea       Follow up in 3-4 months or call earlier if needed     I personally spent a total of 47 minutes in the care of the patient today including preparing to see the patient, getting/reviewing separately obtained history, performing a  medically appropriate exam/evaluation, counseling and educating, placing orders, and documenting clinical information in the EHR.   Harlene Bogaert, AGNP-BC  [facility] [address] [place], [place] [address]  Phone [phone] Fax [phone] Note: This document was prepared with digital dictation and possible smart phrase technology. Any transcriptional errors that result from this process are unintentional. "

## 2024-01-31 ENCOUNTER — Other Ambulatory Visit: Payer: Self-pay | Admitting: Family Medicine

## 2024-01-31 ENCOUNTER — Encounter: Payer: Self-pay | Admitting: Adult Health

## 2024-01-31 ENCOUNTER — Ambulatory Visit (INDEPENDENT_AMBULATORY_CARE_PROVIDER_SITE_OTHER): Admitting: Adult Health

## 2024-01-31 VITALS — BP 150/78 | HR 76 | Ht 60.0 in | Wt 103.4 lb

## 2024-01-31 DIAGNOSIS — G3184 Mild cognitive impairment, so stated: Secondary | ICD-10-CM | POA: Diagnosis not present

## 2024-01-31 DIAGNOSIS — F0781 Postconcussional syndrome: Secondary | ICD-10-CM

## 2024-01-31 DIAGNOSIS — R269 Unspecified abnormalities of gait and mobility: Secondary | ICD-10-CM

## 2024-01-31 DIAGNOSIS — I69398 Other sequelae of cerebral infarction: Secondary | ICD-10-CM

## 2024-01-31 DIAGNOSIS — Z8673 Personal history of transient ischemic attack (TIA), and cerebral infarction without residual deficits: Secondary | ICD-10-CM

## 2024-01-31 DIAGNOSIS — G44221 Chronic tension-type headache, intractable: Secondary | ICD-10-CM

## 2024-01-31 MED ORDER — TOPIRAMATE 50 MG PO TABS: 50.0000 mg | ORAL_TABLET | Freq: Two times a day (BID) | ORAL | 3 refills | Status: AC

## 2024-01-31 NOTE — Patient Instructions (Addendum)
 Your Plan:  Continue topiramate  50 mg twice daily  Continue current regimen for stroke prevention and ensure continue close follow-up with PCP and cardiology for stroke risk factor management  Continue to use rollator walker at all times for fall prevention; continue doing exercises at home as advised by PT     Follow up in 6-8 months or call earlier if needed     Thank you for coming to see us  at [facility]. I hope we have been able to provide you high quality care today.  You may receive a patient satisfaction survey over the next few weeks. We would appreciate your feedback and comments so that we may continue to improve ourselves and the health of our patients.

## 2024-02-01 NOTE — Telephone Encounter (Signed)
 Pt seen 01/31/24.

## 2024-02-11 ENCOUNTER — Encounter

## 2024-02-12 ENCOUNTER — Ambulatory Visit: Attending: Cardiovascular Disease

## 2024-02-12 DIAGNOSIS — I639 Cerebral infarction, unspecified: Secondary | ICD-10-CM

## 2024-02-14 LAB — CUP PACEART REMOTE DEVICE CHECK
Date Time Interrogation Session: 20260131232332
Implantable Pulse Generator Implant Date: 20240618

## 2024-02-17 NOTE — Progress Notes (Signed)
 Remote Loop Recorder Transmission

## 2024-03-13 ENCOUNTER — Encounter

## 2024-03-14 ENCOUNTER — Ambulatory Visit

## 2024-04-25 ENCOUNTER — Ambulatory Visit: Admitting: Internal Medicine

## 2024-05-21 ENCOUNTER — Ambulatory Visit

## 2024-08-16 ENCOUNTER — Ambulatory Visit: Admitting: Adult Health

## 2024-08-28 ENCOUNTER — Ambulatory Visit: Admitting: Adult Health
